# Patient Record
Sex: Female | Born: 1950 | Race: White | Hispanic: No | Marital: Married | Smoking: Current every day smoker
Health system: Southern US, Community
[De-identification: ages and names within clinical notes are randomized; demographics above are authoritative.]

## PROBLEM LIST (undated history)

## (undated) DIAGNOSIS — G9340 Encephalopathy, unspecified: Secondary | ICD-10-CM

## (undated) DIAGNOSIS — I10 Essential (primary) hypertension: Secondary | ICD-10-CM

## (undated) DIAGNOSIS — J45909 Unspecified asthma, uncomplicated: Secondary | ICD-10-CM

## (undated) DIAGNOSIS — I499 Cardiac arrhythmia, unspecified: Secondary | ICD-10-CM

## (undated) DIAGNOSIS — G9341 Metabolic encephalopathy: Secondary | ICD-10-CM

## (undated) DIAGNOSIS — R0902 Hypoxemia: Secondary | ICD-10-CM

## (undated) DIAGNOSIS — K219 Gastro-esophageal reflux disease without esophagitis: Secondary | ICD-10-CM

## (undated) DIAGNOSIS — M419 Scoliosis, unspecified: Secondary | ICD-10-CM

## (undated) DIAGNOSIS — S2239XA Fracture of one rib, unspecified side, initial encounter for closed fracture: Secondary | ICD-10-CM

## (undated) DIAGNOSIS — G8929 Other chronic pain: Secondary | ICD-10-CM

## (undated) DIAGNOSIS — G8918 Other acute postprocedural pain: Secondary | ICD-10-CM

## (undated) DIAGNOSIS — E785 Hyperlipidemia, unspecified: Secondary | ICD-10-CM

## (undated) DIAGNOSIS — T7840XA Allergy, unspecified, initial encounter: Secondary | ICD-10-CM

## (undated) DIAGNOSIS — I82409 Acute embolism and thrombosis of unspecified deep veins of unspecified lower extremity: Secondary | ICD-10-CM

## (undated) DIAGNOSIS — Z972 Presence of dental prosthetic device (complete) (partial): Secondary | ICD-10-CM

## (undated) DIAGNOSIS — G629 Polyneuropathy, unspecified: Secondary | ICD-10-CM

## (undated) DIAGNOSIS — J189 Pneumonia, unspecified organism: Secondary | ICD-10-CM

## (undated) DIAGNOSIS — D649 Anemia, unspecified: Secondary | ICD-10-CM

## (undated) DIAGNOSIS — G43909 Migraine, unspecified, not intractable, without status migrainosus: Secondary | ICD-10-CM

## (undated) DIAGNOSIS — J690 Pneumonitis due to inhalation of food and vomit: Secondary | ICD-10-CM

## (undated) DIAGNOSIS — I214 Non-ST elevation (NSTEMI) myocardial infarction: Secondary | ICD-10-CM

## (undated) HISTORY — DX: Pneumonitis due to inhalation of food and vomit: J69.0

## (undated) HISTORY — PX: EYE SURGERY: SHX253

## (undated) HISTORY — DX: Non-ST elevation (NSTEMI) myocardial infarction: I21.4

## (undated) HISTORY — PX: BACK SURGERY: SHX140

## (undated) HISTORY — DX: Migraine, unspecified, not intractable, without status migrainosus: G43.909

## (undated) HISTORY — DX: Scoliosis, unspecified: M41.9

## (undated) HISTORY — DX: Pneumonia, unspecified organism: J18.9

## (undated) HISTORY — DX: Allergy, unspecified, initial encounter: T78.40XA

## (undated) HISTORY — DX: Metabolic encephalopathy: G93.41

## (undated) HISTORY — DX: Acute embolism and thrombosis of unspecified deep veins of unspecified lower extremity: I82.409

## (undated) HISTORY — PX: SPINE SURGERY: SHX786

## (undated) HISTORY — PX: APPENDECTOMY: SHX54

## (undated) HISTORY — PX: CARDIAC CATHETERIZATION: SHX172

## (undated) HISTORY — DX: Gastro-esophageal reflux disease without esophagitis: K21.9

## (undated) HISTORY — DX: Hyperlipidemia, unspecified: E78.5

## (undated) HISTORY — PX: VASCULAR SURGERY: SHX849

## (undated) HISTORY — DX: Hypoxemia: R09.02

## (undated) HISTORY — DX: Fracture of one rib, unspecified side, initial encounter for closed fracture: S22.39XA

## (undated) HISTORY — DX: Encephalopathy, unspecified: G93.40

## (undated) HISTORY — DX: Essential (primary) hypertension: I10

## (undated) HISTORY — PX: CERVICAL FUSION: SHX112

## (undated) HISTORY — PX: TONSILLECTOMY: SUR1361

## (undated) HISTORY — DX: Other acute postprocedural pain: G89.18

## (undated) HISTORY — DX: Polyneuropathy, unspecified: G62.9

## (undated) HISTORY — PX: FOOT SURGERY: SHX648

## (undated) HISTORY — DX: Other chronic pain: G89.29

---

## 1970-11-25 HISTORY — PX: CHOLECYSTECTOMY: SHX55

## 1973-11-25 HISTORY — PX: ABDOMINAL HYSTERECTOMY: SHX81

## 2001-10-20 ENCOUNTER — Other Ambulatory Visit: Admission: RE | Admit: 2001-10-20 | Discharge: 2001-10-20 | Payer: Self-pay | Admitting: Family Medicine

## 2001-10-20 LAB — HM PAP SMEAR: HM Pap smear: NORMAL

## 2005-10-16 ENCOUNTER — Other Ambulatory Visit: Payer: Self-pay

## 2005-10-16 ENCOUNTER — Emergency Department: Payer: Self-pay | Admitting: Emergency Medicine

## 2007-05-26 ENCOUNTER — Ambulatory Visit: Payer: Self-pay

## 2008-05-20 ENCOUNTER — Ambulatory Visit (HOSPITAL_COMMUNITY): Admission: RE | Admit: 2008-05-20 | Discharge: 2008-05-20 | Payer: Self-pay | Admitting: Cardiovascular Disease

## 2008-05-24 ENCOUNTER — Encounter: Admission: RE | Admit: 2008-05-24 | Discharge: 2008-05-24 | Payer: Self-pay | Admitting: Cardiovascular Disease

## 2008-05-30 ENCOUNTER — Inpatient Hospital Stay (HOSPITAL_COMMUNITY): Admission: RE | Admit: 2008-05-30 | Discharge: 2008-05-31 | Payer: Self-pay | Admitting: Cardiovascular Disease

## 2008-05-30 HISTORY — PX: OTHER SURGICAL HISTORY: SHX169

## 2008-11-25 DIAGNOSIS — G629 Polyneuropathy, unspecified: Secondary | ICD-10-CM

## 2008-11-25 HISTORY — DX: Polyneuropathy, unspecified: G62.9

## 2009-08-01 ENCOUNTER — Ambulatory Visit: Payer: Self-pay | Admitting: Family Medicine

## 2009-08-01 LAB — HM MAMMOGRAPHY

## 2009-09-06 ENCOUNTER — Ambulatory Visit: Payer: Self-pay

## 2009-09-26 ENCOUNTER — Ambulatory Visit (HOSPITAL_COMMUNITY): Admission: RE | Admit: 2009-09-26 | Discharge: 2009-09-27 | Payer: Self-pay | Admitting: Cardiovascular Disease

## 2009-10-30 ENCOUNTER — Observation Stay (HOSPITAL_COMMUNITY): Admission: EM | Admit: 2009-10-30 | Discharge: 2009-10-31 | Payer: Self-pay | Admitting: Cardiovascular Disease

## 2009-10-30 ENCOUNTER — Emergency Department: Payer: Self-pay | Admitting: Emergency Medicine

## 2009-10-31 HISTORY — PX: OTHER SURGICAL HISTORY: SHX169

## 2009-11-28 ENCOUNTER — Ambulatory Visit: Payer: Self-pay | Admitting: Family Medicine

## 2010-01-10 ENCOUNTER — Encounter: Admission: RE | Admit: 2010-01-10 | Discharge: 2010-01-10 | Payer: Self-pay | Admitting: Cardiovascular Disease

## 2010-01-16 ENCOUNTER — Ambulatory Visit: Payer: Self-pay | Admitting: Family Medicine

## 2010-04-25 ENCOUNTER — Ambulatory Visit: Payer: Self-pay | Admitting: Gastroenterology

## 2010-04-25 LAB — HM COLONOSCOPY

## 2010-06-07 ENCOUNTER — Emergency Department: Payer: Self-pay | Admitting: Emergency Medicine

## 2011-02-26 LAB — HEPARIN LEVEL (UNFRACTIONATED): Heparin Unfractionated: 0.37 IU/mL (ref 0.30–0.70)

## 2011-02-26 LAB — CARDIAC PANEL(CRET KIN+CKTOT+MB+TROPI)
Relative Index: INVALID (ref 0.0–2.5)
Relative Index: INVALID (ref 0.0–2.5)
Total CK: 77 U/L (ref 7–177)
Troponin I: <0.01 ng/mL (ref 0.00–0.06)
Troponin I: <0.01 ng/mL (ref 0.00–0.06)

## 2011-02-26 LAB — BASIC METABOLIC PANEL
BUN: 15 mg/dL (ref 6–23)
CO2: 26 mEq/L (ref 19–32)
Calcium: 8.8 mg/dL (ref 8.4–10.5)
GFR calc non Af Amer: >60 mL/min (ref >60)
Glucose, Bld: 98 mg/dL (ref 70–99)
Sodium: 141 mEq/L (ref 135–145)

## 2011-02-26 LAB — CBC
HCT: 33.4 % — ABNORMAL LOW (ref 36.0–46.0)
Hemoglobin: 11.6 g/dL — ABNORMAL LOW (ref 12.0–15.0)
Platelets: 173 10*3/uL (ref 150–400)
RBC: 3.4 MIL/uL — ABNORMAL LOW (ref 3.87–5.11)
WBC: 5 10*3/uL (ref 4.0–10.5)

## 2011-02-26 LAB — LIPID PANEL
Total CHOL/HDL Ratio: 2.3 RATIO
VLDL: 21 mg/dL (ref 0–40)

## 2011-02-26 LAB — GLUCOSE, CAPILLARY: Glucose-Capillary: 95 mg/dL (ref 70–99)

## 2011-02-26 LAB — HEMOGLOBIN A1C
Hgb A1c MFr Bld: 5.9 % (ref 4.6–6.1)
Mean Plasma Glucose: 123 mg/dL

## 2011-02-26 LAB — MAGNESIUM: Magnesium: 2 mg/dL (ref 1.5–2.5)

## 2011-02-26 LAB — D-DIMER, QUANTITATIVE: D-Dimer, Quant: 0.32 ug/mL-FEU (ref 0.00–0.48)

## 2011-02-27 LAB — CBC
HCT: 36.5 % (ref 36.0–46.0)
MCV: 97.3 fL (ref 78.0–100.0)
MCV: 98.3 fL (ref 78.0–100.0)
Platelets: 187 10*3/uL (ref 150–400)
Platelets: ADEQUATE 10*3/uL (ref 150–400)
RBC: 3.71 MIL/uL — ABNORMAL LOW (ref 3.87–5.11)
RBC: 3.71 MIL/uL — ABNORMAL LOW (ref 3.87–5.11)
RDW: 13.2 % (ref 11.5–15.5)
WBC: 4 10*3/uL (ref 4.0–10.5)

## 2011-02-27 LAB — BASIC METABOLIC PANEL
BUN: 18 mg/dL (ref 6–23)
Calcium: 9.1 mg/dL (ref 8.4–10.5)
Chloride: 104 mEq/L (ref 96–112)
Chloride: 104 mEq/L (ref 96–112)
GFR calc non Af Amer: >60 mL/min (ref >60)
GFR calc non Af Amer: >60 mL/min (ref >60)
Glucose, Bld: 91 mg/dL (ref 70–99)
Potassium: 3.7 mEq/L (ref 3.5–5.1)
Potassium: 3.9 mEq/L (ref 3.5–5.1)
Sodium: 138 mEq/L (ref 135–145)
Sodium: 140 mEq/L (ref 135–145)

## 2011-02-27 LAB — PROTIME-INR: INR: 0.9 (ref 0.00–1.49)

## 2011-02-27 LAB — APTT: aPTT: 31 seconds (ref 24–37)

## 2011-04-09 NOTE — Cardiovascular Report (Signed)
[name], Cheryl [name] NO.:  [id number]   MEDICAL RECORD NO.:  [id number]          PATIENT TYPE:  INP   LOCATION:  6529                         FACILITY:  [facility]   PHYSICIAN:  Nanetta Batty, M.D.   DATE OF BIRTH:  09-29-1951   DATE OF PROCEDURE:  DATE OF DISCHARGE:                            CARDIAC CATHETERIZATION   Mr. Cheryl [name] is a very pleasant 60 year old married white female mother of  3, grandmother of 9 grandchildren who is referred to me by Dr. Sherlynn Stalls for cyanotic left fourth and fifth toes and foot pain.  She had  Dopplers, which were normal.  CT angiogram, which showed distal  abdominal aortic stenosis.  She had negative Myoview normal 2-D echo.  She presents now for angiography and potential intervention for symptom-  limiting claudication.   PROCEDURE DESCRIPTION:  The patient was brought to the Second Floor  [facility] in the postabsorptive state.  She was  premedicated with p.o. Valium.  Her right and left groins were prepped  and shaved in the usual sterile fashion.  Xylocaine 1% was used for  local anesthesia.  A 5-French sheath was inserted into the right femoral  artery using standard Seldinger technique.  A Wholey wire was then  advanced across the distal abdominal aortic stenosis with the aid of a  short 5-French right Judkins catheter for directionality.  A 5-French  pigtail catheter was used for abdominal aortography.  Visipaque dye was  used  for the entirety of the case.  Retrograde aortic pressures were  monitored during the case.   ANGIOGRAPHIC RESULTS:  1. Abdominal aorta stent;      a.     90% fairly focal napkin ring distal abdominal aortic       stenosis.      b.     Abnormal aortoiliac bifurcation.   PROCEDURE DESCRIPTION:  The patient received 3000 units of heparin  intravenously.  The 5-French sheath in the right femoral artery was  exchanged over wire for a 8-French Brite Tip 30-cm long Cordis  sheath,  which was then advanced across the stenosis over the [facility] wire.  Using  a 9 x 28 OmniLink balloon expandable stent, this was then positioned  across the stenosis angiographically and deployed at 8-10 atmospheres.  Because of some directional bias towards the right side with incomplete  apposition of the left lateral border of distal abdominal aorta, access  was obtained in the left femoral artery using a short 8-French sheath  and the stent was then dilated with a 10 x 2 balloon, both from the  right and left side sequentially.  Resulting reduction of a 90% distal  abdominal aortic stenosis to 0% residual.  There was a small linear  dissection contained within the stent segment without extravasation.  The patient tolerated the procedure well.  The guidewires were removed.  The patient left lab in stable condition.  ACT will be measured and the  sheaths will be removed once documentation of ACT blood 200.  Pressure will be held in the groins to achieve  hemostasis.  The patient  was allergic to aspirin, will be loaded with Plavix and treated with  p.o. 75 mg of p.o. Plavix.  She left lab in stable condition.  Dr.  Suzette Battiest was notified of these results.  The patient left lab in stable  condition.      Nanetta Batty, M.D.  Electronically Signed     JB/MEDQ  D:  05/30/2008  T:  05/31/2008  Job:  [id number]   cc:   Second Floor [facility]  [facility]  Sherlynn Stalls

## 2011-04-09 NOTE — Procedures (Signed)
NAMEARCHITA, Cheryl [name]                  ACCOUNT NO.:  [id number]   MEDICAL RECORD NO.:  [id number]          PATIENT TYPE:  INP   LOCATION:  2807                         FACILITY:  [facility]   PHYSICIAN:  Nanetta Batty, M.D.   DATE OF BIRTH:  07-01-1951   DATE OF PROCEDURE:  09/26/2009  DATE OF DISCHARGE:                    PERIPHERAL VASCULAR INVASIVE PROCEDURE   PROCEDURE:  Peripheral angiogram/ percutaneous transluminal angioplasty  and stent procedure.   REASON FOR PRESENTATION:  Ms. Weissberg is a 60 year old female with  peripheral vascular occlusive disease status post distal abdominal  aortic PTA stenting by myself, May 30, 2008, with an Omnilink Balloon  expandable 9 x 28 mm stent.  Post procedure her ABI's normalized and her  symptoms resolved.  She does have a history of hypertension nondistended  hypovolemia as well as tobacco abuse.  She has had recurrent  claudication with ABI's that have gone down since her last study.  She  presents now for angiography and potential re-intervention.   DESCRIPTION OF PROCEDURE:  The patient was brought to the second floor  [facility] suite in the post-absorptive state.  She was  premedicated with p.o. Valium.  Her right groin was prepped and draped  in the usual sterile fashion.  A 1% Xylocaine was used for local  anesthesia.  A 5-French sheath was inserted into the right femoral  artery using standard Seldinger technique.  A 5-French tennis racquet  catheter was used for abdominal aortography in the PA and lateral view.  Visipaque dye was used throughout the entirety of the case.  Retrograde  aortic pressures were monitored during the case.   ANGIOGRAPHIC RESULTS:  The distal abdominal aortic stent had a mobile  intraluminal filling defect obstructing 50 to 70% of the lumen.  The  iliac bifurcation was free of significant disease.   The existing 5-French sheath was exchanged over a __________wire for a 7-  [place] __________tip  sheath.  The patient received 2500 units of heparin  intravenously.  The distal aorta was restented using a 12 x 4 Smart  Nitinol self-expanding stent, trapping the intraluminal defect within  the previously stented segment.  It was post dilated with a 10 x 2  Powerflex at 4 atmospheres resulting in reduction of 50 to 70%  obstruction secondary to a mobile intraluminal filling defect to 0%  residual with excellent flow.  There was mild extension of the distal  end of the stent within the origin of the left common iliac artery, but  this does not appear to be flow-limiting.   IMPRESSION:  Successful percutaneous transluminal angioplasty and re-  stenting of the distal abdominal aorta secondary to a filling defect,  probably related to tissue prolapse.  The patient tolerated the  procedure well.  The ACT was measured and the sheath was removed.  Pressure was held in the groin to achieve hemostasis.  The patient left  the Lab in stable condition.  She will be gently hydrated overnight and  discharged home in the morning on Effient.  She will obtain follow up  abdominal and lower extremity Doppler studies and we  will see her back  in the office after that in followup.  She left the Lab in stable  condition.      Nanetta Batty, M.D.  Electronically Signed     JB/MEDQ  D:  09/26/2009  T:  09/26/2009  Job:  [id number]   cc:   [facility]  [facility], Dr. Sherlynn Stalls [facility]

## 2011-04-12 NOTE — Discharge Summary (Signed)
NAMEQUETZALLY, CALLAS                  ACCOUNT NO.:  [id number]   MEDICAL RECORD NO.:  [id number]          PATIENT TYPE:  INP   LOCATION:  6529                         FACILITY:  [facility]   PHYSICIAN:  Nanetta Batty, M.D.   DATE OF BIRTH:  [birth year]   DATE OF ADMISSION:  05/30/2008  DATE OF DISCHARGE:  05/31/2008                               DISCHARGE SUMMARY   DISCHARGE DIAGNOSES:  1. Abnormal ankle-brachial indices with cyanotic toes.  2. CT angio revealed high-grade lesion in the distal aorta.  3. Hip and buttock claudication.  4. 90% fairly focal napkin-ring distal abdominal aortic stenosis with      percutaneous transluminal angioplasty and stent deployment.  5. Chest pain, negative cardiac markers and history of negative stress      test recently.   DISCHARGE CONDITION:  Improved.   PROCEDURES:  Pulmonary vein angioplasty by Dr. Nanetta Batty with  percutaneous transluminal angioplasty and stent deployment, 90% stenosis  reduced to 0.   ALLERGIES:  ASPIRIN, CODEINE SULFATE, and CONTRAST.  Please note, the  patient has had pruritus on CT angio, so she was premedicated for  cardiac catheterization and her PV angiogram.   DISCHARGE MEDICATIONS:  1. New medication Plavix 75 mg one daily.  2. Premarin 0.625 daily.  3. Prilosec 20 mg daily.  4. Amitriptyline 10 mg as needed.  5. Cyclobenzaprine 10 mg as needed.  6. Benadryl 25 mg as needed.   DISCHARGE INSTRUCTIONS:  1. Low-sodium heart-healthy diet.  2. Wash cath site with soap and water.  Call if any bleeding,      swelling, or drainage.  3. Increase activity slowly.  May shower or bathe.  No lifting for 1      week.  No driving for 2 days.  4. Follow up with Dr. Allyson Sabal on June 21, 2008, at 4 p.m.  5. We will schedule for lower extremity arterial Dopplers on June 16, 2008, at 1:00 p.m. at Dr. Hazle Coca office and also an abdominal      Doppler.   HISTORY OF PRESENT ILLNESS:  A 60 year old female was seen by Dr.  Allyson Sabal  for claudication symptoms and cyanotic toes and abnormal Dopplers that  were done in our office.  She also underwent CT angio revealing high-  grade lesion in the distal aorta just above the iliac bifurcation.  This  was planned and then she was set up for PTA and stenting.   The patient presented, had the PV angio, the stent was placed, and she  did well that night.  After the stent, she did develop some chest  discomfort with nausea, was relieved with nitroglycerin, cardiac enzymes  were negative, and she had had no further pain.  It was felt that was  most likely related to GI as the nitroglycerin relieves reflux at times  as well.   By the morning of May 31, 2008, she was stable and would follow up as an  outpatient and was discharged home by Dr. Allyson Sabal.   PHYSICAL EXAM:  Blood pressure was 117/81 and pulse  87.  Groin cath site  was stable and 2+ pedal pulses.   LABORATORY DATA:  Hematocrit 33.5, WBC 9.8, and platelets 203.  Sodium  139, potassium 4.2, chloride 104, CO2 27, glucose 130, BUN 13,  creatinine 0.81, CK 28-27, MB 1.0, and troponin-I 0.01 x2.   EKG in the morning after the chest pain, sinus rhythm, nonspecific ST  changes.  No changes since her last tracing.   The patient was stable and was discharged home.      Darcella Gasman. Annie Paras, N.P.      Nanetta Batty, M.D.  Electronically Signed    LRI/MEDQ  D:  07/05/2008  T:  07/06/2008  Job:  [id number]   cc:   Sherlynn Stalls

## 2011-08-22 LAB — CBC
HCT: 33.5 — ABNORMAL LOW
Hemoglobin: 11.4 — ABNORMAL LOW
MCHC: 34.1
WBC: 9.8

## 2011-08-22 LAB — BASIC METABOLIC PANEL
BUN: 13
Creatinine, Ser: 0.81
GFR calc non Af Amer: >60
Glucose, Bld: 130 — ABNORMAL HIGH

## 2011-08-22 LAB — CARDIAC PANEL(CRET KIN+CKTOT+MB+TROPI): Troponin I: 0.01

## 2011-11-11 ENCOUNTER — Inpatient Hospital Stay: Payer: Self-pay | Admitting: Internal Medicine

## 2012-02-03 ENCOUNTER — Inpatient Hospital Stay: Payer: Self-pay | Admitting: Internal Medicine

## 2012-02-03 LAB — TSH: Thyroid Stimulating Horm: 1.42 u[IU]/mL

## 2012-02-03 LAB — URINALYSIS, COMPLETE
Bacteria: NONE SEEN
Glucose,UR: NEGATIVE mg/dL (ref 0–75)
Leukocyte Esterase: NEGATIVE
Nitrite: NEGATIVE
Specific Gravity: 1.026 (ref 1.003–1.030)
WBC UR: 1 /HPF (ref 0–5)

## 2012-02-03 LAB — BASIC METABOLIC PANEL
Anion Gap: 12 (ref 7–16)
Calcium, Total: 9.3 mg/dL (ref 8.5–10.1)
Chloride: 102 mmol/L (ref 98–107)
Co2: 23 mmol/L (ref 21–32)
EGFR (Non-African Amer.): >60
Glucose: 97 mg/dL (ref 65–99)
Osmolality: 278 (ref 275–301)
Sodium: 137 mmol/L (ref 136–145)

## 2012-02-03 LAB — DIFFERENTIAL
Basophil %: 0.5 %
Eosinophil %: 0 %
Monocyte %: 6 %
Neutrophil %: 75.7 %

## 2012-02-03 LAB — CBC
HCT: 37.2 % (ref 35.0–47.0)
MCH: 33.8 pg (ref 26.0–34.0)
MCHC: 34 g/dL (ref 32.0–36.0)
RBC: 3.75 10*6/uL — ABNORMAL LOW (ref 3.80–5.20)
RDW: 13.8 % (ref 11.5–14.5)
WBC: 5.7 10*3/uL (ref 3.6–11.0)

## 2012-02-03 LAB — TROPONIN I
Troponin-I: <0.02 ng/mL
Troponin-I: <0.02 ng/mL

## 2012-02-03 LAB — CK TOTAL AND CKMB (NOT AT ARMC)
CK, Total: 73 U/L (ref 21–215)
CK, Total: 80 U/L (ref 21–215)
CK-MB: 2.1 ng/mL (ref 0.5–3.6)

## 2012-02-04 LAB — CBC WITH DIFFERENTIAL/PLATELET
Eosinophil %: 0 %
HCT: 36 % (ref 35.0–47.0)
Lymphocyte #: 0.4 10*3/uL — ABNORMAL LOW (ref 1.0–3.6)
Lymphocyte %: 7 %
MCV: 100 fL (ref 80–100)
Monocyte %: 0.8 %
Neutrophil #: 4.8 10*3/uL (ref 1.4–6.5)
RDW: 13.9 % (ref 11.5–14.5)
WBC: 5.2 10*3/uL (ref 3.6–11.0)

## 2012-02-04 LAB — BASIC METABOLIC PANEL
Chloride: 101 mmol/L (ref 98–107)
Co2: 22 mmol/L (ref 21–32)
Creatinine: 0.61 mg/dL (ref 0.60–1.30)
Osmolality: 282 (ref 275–301)
Sodium: 139 mmol/L (ref 136–145)

## 2012-02-09 LAB — CULTURE, BLOOD (SINGLE)

## 2012-03-29 ENCOUNTER — Emergency Department: Payer: Self-pay | Admitting: Unknown Physician Specialty

## 2012-03-29 LAB — COMPREHENSIVE METABOLIC PANEL
Albumin: 3.7 g/dL (ref 3.4–5.0)
Anion Gap: 12 (ref 7–16)
BUN: 29 mg/dL — ABNORMAL HIGH (ref 7–18)
Chloride: 103 mmol/L (ref 98–107)
EGFR (Non-African Amer.): >60
Glucose: 75 mg/dL (ref 65–99)
SGPT (ALT): 15 U/L
Total Protein: 7.3 g/dL (ref 6.4–8.2)

## 2012-03-29 LAB — CBC WITH DIFFERENTIAL/PLATELET
Basophil %: 0.8 %
HCT: 36.8 % (ref 35.0–47.0)
HGB: 12.5 g/dL (ref 12.0–16.0)
Lymphocyte #: 0.8 10*3/uL — ABNORMAL LOW (ref 1.0–3.6)
Lymphocyte %: 18.3 %
MCV: 98 fL (ref 80–100)
Monocyte %: 6.2 %
RBC: 3.76 10*6/uL — ABNORMAL LOW (ref 3.80–5.20)

## 2012-03-29 LAB — TROPONIN I: Troponin-I: <0.02 ng/mL

## 2012-04-04 LAB — CULTURE, BLOOD (SINGLE)

## 2012-08-05 ENCOUNTER — Ambulatory Visit: Payer: Self-pay | Admitting: Orthopedic Surgery

## 2012-08-13 ENCOUNTER — Ambulatory Visit: Payer: Self-pay | Admitting: Family Medicine

## 2012-08-27 ENCOUNTER — Ambulatory Visit: Payer: Self-pay | Admitting: Pain Medicine

## 2012-09-10 ENCOUNTER — Ambulatory Visit: Payer: Self-pay | Admitting: Pain Medicine

## 2012-09-28 ENCOUNTER — Ambulatory Visit: Payer: Self-pay | Admitting: Pain Medicine

## 2012-10-06 ENCOUNTER — Ambulatory Visit: Payer: Self-pay | Admitting: Pain Medicine

## 2012-10-26 ENCOUNTER — Ambulatory Visit: Payer: Self-pay | Admitting: Pain Medicine

## 2012-11-05 ENCOUNTER — Ambulatory Visit: Payer: Self-pay | Admitting: Pain Medicine

## 2012-11-26 ENCOUNTER — Ambulatory Visit: Payer: Self-pay | Admitting: Family Medicine

## 2012-11-26 LAB — CREATININE, SERUM
Creatinine: 0.85 mg/dL (ref 0.60–1.30)
EGFR (African American): >60
EGFR (Non-African Amer.): >60

## 2012-12-09 ENCOUNTER — Ambulatory Visit: Payer: Self-pay | Admitting: Pain Medicine

## 2012-12-22 ENCOUNTER — Ambulatory Visit: Payer: Self-pay | Admitting: Neurology

## 2012-12-29 ENCOUNTER — Ambulatory Visit: Payer: Self-pay | Admitting: Pain Medicine

## 2013-01-21 ENCOUNTER — Ambulatory Visit: Payer: Self-pay | Admitting: Pain Medicine

## 2013-01-26 ENCOUNTER — Observation Stay: Payer: Self-pay | Admitting: Family Medicine

## 2013-01-26 LAB — URINALYSIS, COMPLETE
Bilirubin,UR: NEGATIVE
Blood: NEGATIVE
Leukocyte Esterase: NEGATIVE
Nitrite: NEGATIVE
Ph: 5 (ref 4.5–8.0)
RBC,UR: 1 /HPF (ref 0–5)
Squamous Epithelial: 1

## 2013-01-26 LAB — CBC
MCHC: 33.8 g/dL (ref 32.0–36.0)
MCV: 105 fL — ABNORMAL HIGH (ref 80–100)
Platelet: 249 10*3/uL (ref 150–440)
RBC: 4.03 10*6/uL (ref 3.80–5.20)
RDW: 13.4 % (ref 11.5–14.5)

## 2013-01-26 LAB — COMPREHENSIVE METABOLIC PANEL
BUN: 16 mg/dL (ref 7–18)
Calcium, Total: 8.6 mg/dL (ref 8.5–10.1)
Chloride: 103 mmol/L (ref 98–107)
Co2: 28 mmol/L (ref 21–32)
SGOT(AST): 25 U/L (ref 15–37)
SGPT (ALT): 21 U/L (ref 12–78)

## 2013-01-27 LAB — BASIC METABOLIC PANEL
Anion Gap: 5 — ABNORMAL LOW (ref 7–16)
Chloride: 109 mmol/L — ABNORMAL HIGH (ref 98–107)
Co2: 26 mmol/L (ref 21–32)
Creatinine: 0.88 mg/dL (ref 0.60–1.30)
Glucose: 82 mg/dL (ref 65–99)
Osmolality: 279 (ref 275–301)
Potassium: 3.4 mmol/L — ABNORMAL LOW (ref 3.5–5.1)

## 2013-01-27 LAB — CBC WITH DIFFERENTIAL/PLATELET
Basophil #: 0 10*3/uL (ref 0.0–0.1)
Eosinophil #: 0.1 10*3/uL (ref 0.0–0.7)
HCT: 30.8 % — ABNORMAL LOW (ref 35.0–47.0)
HGB: 10.8 g/dL — ABNORMAL LOW (ref 12.0–16.0)
Lymphocyte #: 0.5 10*3/uL — ABNORMAL LOW (ref 1.0–3.6)
Lymphocyte %: 9.5 %
MCH: 36.3 pg — ABNORMAL HIGH (ref 26.0–34.0)
Monocyte %: 8.9 %
Neutrophil #: 4.1 10*3/uL (ref 1.4–6.5)
Platelet: 220 10*3/uL (ref 150–440)
RDW: 13.1 % (ref 11.5–14.5)

## 2013-01-28 LAB — CBC WITH DIFFERENTIAL/PLATELET
Eosinophil %: 0.5 %
HGB: 10.7 g/dL — ABNORMAL LOW (ref 12.0–16.0)
Lymphocyte #: 0.5 10*3/uL — ABNORMAL LOW (ref 1.0–3.6)
Lymphocyte %: 13.9 %
MCH: 35 pg — ABNORMAL HIGH (ref 26.0–34.0)
MCHC: 33.9 g/dL (ref 32.0–36.0)
MCV: 103 fL — ABNORMAL HIGH (ref 80–100)
Neutrophil #: 2.6 10*3/uL (ref 1.4–6.5)
Neutrophil %: 73 %
RBC: 3.06 10*6/uL — ABNORMAL LOW (ref 3.80–5.20)
WBC: 3.5 10*3/uL — ABNORMAL LOW (ref 3.6–11.0)

## 2013-01-28 LAB — BASIC METABOLIC PANEL
BUN: 12 mg/dL (ref 7–18)
Calcium, Total: 7.7 mg/dL — ABNORMAL LOW (ref 8.5–10.1)
Co2: 18 mmol/L — ABNORMAL LOW (ref 21–32)
Creatinine: 0.67 mg/dL (ref 0.60–1.30)
EGFR (African American): >60
Osmolality: 273 (ref 275–301)
Potassium: 3.6 mmol/L (ref 3.5–5.1)

## 2013-01-29 LAB — CBC WITH DIFFERENTIAL/PLATELET
HGB: 10.3 g/dL — ABNORMAL LOW (ref 12.0–16.0)
Neutrophil #: 2.6 10*3/uL (ref 1.4–6.5)
Platelet: 216 10*3/uL (ref 150–440)
RBC: 2.94 10*6/uL — ABNORMAL LOW (ref 3.80–5.20)
RDW: 12.6 % (ref 11.5–14.5)

## 2013-02-01 LAB — CULTURE, BLOOD (SINGLE)

## 2013-03-10 ENCOUNTER — Ambulatory Visit: Payer: Self-pay | Admitting: Pain Medicine

## 2013-04-02 ENCOUNTER — Observation Stay: Payer: Self-pay | Admitting: Internal Medicine

## 2013-04-02 LAB — COMPREHENSIVE METABOLIC PANEL
Albumin: 3.6 g/dL (ref 3.4–5.0)
Alkaline Phosphatase: 82 U/L (ref 50–136)
BUN: 14 mg/dL (ref 7–18)
Calcium, Total: 9 mg/dL (ref 8.5–10.1)
Co2: 26 mmol/L (ref 21–32)
Creatinine: 0.9 mg/dL (ref 0.60–1.30)
Glucose: 91 mg/dL (ref 65–99)
Potassium: 3.6 mmol/L (ref 3.5–5.1)
SGOT(AST): 17 U/L (ref 15–37)
SGPT (ALT): 17 U/L (ref 12–78)
Sodium: 140 mmol/L (ref 136–145)
Total Protein: 6.8 g/dL (ref 6.4–8.2)

## 2013-04-02 LAB — DRUG SCREEN, URINE
Barbiturates, Ur Screen: NEGATIVE (ref ?–200)
Benzodiazepine, Ur Scrn: NEGATIVE (ref ?–200)
Cannabinoid 50 Ng, Ur ~~LOC~~: POSITIVE (ref ?–50)
Cocaine Metabolite,Ur ~~LOC~~: NEGATIVE (ref ?–300)
Opiate, Ur Screen: NEGATIVE (ref ?–300)
Phencyclidine (PCP) Ur S: NEGATIVE (ref ?–25)

## 2013-04-02 LAB — URINALYSIS, COMPLETE
Blood: NEGATIVE
Leukocyte Esterase: NEGATIVE
Protein: NEGATIVE
RBC,UR: 3 /HPF (ref 0–5)
Specific Gravity: 1.018 (ref 1.003–1.030)
Squamous Epithelial: 1

## 2013-04-02 LAB — CK TOTAL AND CKMB (NOT AT ARMC)
CK, Total: 82 U/L (ref 21–215)
CK-MB: 0.7 ng/mL (ref 0.5–3.6)

## 2013-04-02 LAB — TROPONIN I: Troponin-I: <0.02 ng/mL

## 2013-04-02 LAB — CSF CELL COUNT WITH DIFFERENTIAL
CSF Tube #: 3
Other Cells: 0 %
RBC (CSF): 10 /mm3

## 2013-04-02 LAB — CBC
HCT: 33.9 % — ABNORMAL LOW (ref 35.0–47.0)
MCH: 33.5 pg (ref 26.0–34.0)
MCV: 99 fL (ref 80–100)
RBC: 3.44 10*6/uL — ABNORMAL LOW (ref 3.80–5.20)
WBC: 4.7 10*3/uL (ref 3.6–11.0)

## 2013-04-02 LAB — PROTEIN, CSF: Protein, CSF: 24 mg/dL (ref 15–45)

## 2013-04-02 LAB — GLUCOSE, CSF: Glucose, CSF: 52 mg/dL (ref 40–75)

## 2013-04-03 LAB — BASIC METABOLIC PANEL
Calcium, Total: 9 mg/dL (ref 8.5–10.1)
EGFR (Non-African Amer.): >60
Glucose: 80 mg/dL (ref 65–99)
Potassium: 3.8 mmol/L (ref 3.5–5.1)
Sodium: 138 mmol/L (ref 136–145)

## 2013-04-03 LAB — CBC WITH DIFFERENTIAL/PLATELET
Basophil #: 0.1 10*3/uL (ref 0.0–0.1)
Eosinophil #: 0 10*3/uL (ref 0.0–0.7)
HGB: 11.2 g/dL — ABNORMAL LOW (ref 12.0–16.0)
Lymphocyte %: 17.6 %
MCHC: 34.2 g/dL (ref 32.0–36.0)
MCV: 98 fL (ref 80–100)
Monocyte #: 0.4 x10 3/mm (ref 0.2–0.9)
Monocyte %: 7 %
Neutrophil %: 74.2 %
RBC: 3.35 10*6/uL — ABNORMAL LOW (ref 3.80–5.20)
RDW: 12.6 % (ref 11.5–14.5)

## 2013-04-03 LAB — CK TOTAL AND CKMB (NOT AT ARMC): CK-MB: 1.4 ng/mL (ref 0.5–3.6)

## 2013-04-05 LAB — CSF CULTURE W GRAM STAIN

## 2013-04-06 ENCOUNTER — Ambulatory Visit: Payer: Self-pay | Admitting: Pain Medicine

## 2013-04-28 ENCOUNTER — Ambulatory Visit: Payer: Self-pay | Admitting: Pain Medicine

## 2013-05-07 ENCOUNTER — Other Ambulatory Visit: Payer: Self-pay | Admitting: Pain Medicine

## 2013-05-07 LAB — MAGNESIUM: Magnesium: 1.9 mg/dL

## 2013-05-19 ENCOUNTER — Ambulatory Visit: Payer: Self-pay | Admitting: Pain Medicine

## 2013-08-17 ENCOUNTER — Ambulatory Visit: Payer: Self-pay | Admitting: Family Medicine

## 2013-08-18 ENCOUNTER — Ambulatory Visit: Payer: Self-pay | Admitting: Family Medicine

## 2013-08-18 LAB — HM DEXA SCAN

## 2013-08-27 ENCOUNTER — Ambulatory Visit: Payer: Self-pay | Admitting: Pain Medicine

## 2013-09-21 ENCOUNTER — Ambulatory Visit: Payer: Self-pay | Admitting: Pain Medicine

## 2013-10-06 ENCOUNTER — Ambulatory Visit: Payer: Self-pay | Admitting: Pain Medicine

## 2013-12-03 ENCOUNTER — Ambulatory Visit: Payer: Self-pay | Admitting: Pain Medicine

## 2014-02-10 DIAGNOSIS — I739 Peripheral vascular disease, unspecified: Secondary | ICD-10-CM | POA: Insufficient documentation

## 2014-02-10 DIAGNOSIS — I2510 Atherosclerotic heart disease of native coronary artery without angina pectoris: Secondary | ICD-10-CM | POA: Insufficient documentation

## 2014-02-10 DIAGNOSIS — F419 Anxiety disorder, unspecified: Secondary | ICD-10-CM | POA: Insufficient documentation

## 2014-04-05 ENCOUNTER — Ambulatory Visit: Payer: Self-pay | Admitting: Pain Medicine

## 2014-04-13 DIAGNOSIS — G629 Polyneuropathy, unspecified: Secondary | ICD-10-CM | POA: Insufficient documentation

## 2014-04-15 ENCOUNTER — Ambulatory Visit: Payer: Self-pay | Admitting: Pain Medicine

## 2014-04-25 ENCOUNTER — Ambulatory Visit: Payer: Self-pay | Admitting: Family Medicine

## 2014-05-03 ENCOUNTER — Ambulatory Visit: Payer: Self-pay | Admitting: Family Medicine

## 2014-05-10 ENCOUNTER — Ambulatory Visit: Payer: Self-pay | Admitting: Pain Medicine

## 2014-05-30 ENCOUNTER — Ambulatory Visit: Payer: Self-pay | Admitting: Pain Medicine

## 2014-07-06 LAB — LIPID PANEL
CHOLESTEROL: 157 mg/dL (ref 0–200)
HDL: 83 mg/dL — AB (ref 35–70)
LDL CALC: 55 mg/dL
TRIGLYCERIDES: 93 mg/dL (ref 40–160)

## 2014-07-06 LAB — BASIC METABOLIC PANEL
Creatinine: 1 mg/dL (ref <=1.1)
Glucose: 83 mg/dL

## 2014-07-12 ENCOUNTER — Ambulatory Visit: Payer: Self-pay | Admitting: Pain Medicine

## 2014-08-19 ENCOUNTER — Emergency Department: Payer: Self-pay | Admitting: Emergency Medicine

## 2014-08-19 LAB — CBC WITH DIFFERENTIAL/PLATELET
BASOS ABS: 0 10*3/uL (ref 0.0–0.1)
Basophil %: 0.2 %
EOS PCT: 0.2 %
Eosinophil #: 0 10*3/uL (ref 0.0–0.7)
HCT: 36.6 % (ref 35.0–47.0)
HGB: 12.1 g/dL (ref 12.0–16.0)
Lymphocyte #: 0.6 10*3/uL — ABNORMAL LOW (ref 1.0–3.6)
Lymphocyte %: 10.9 %
MCH: 33.3 pg (ref 26.0–34.0)
MCHC: 33.1 g/dL (ref 32.0–36.0)
MCV: 100 fL (ref 80–100)
MONO ABS: 0.4 x10 3/mm (ref 0.2–0.9)
MONOS PCT: 7.8 %
Neutrophil #: 4.5 10*3/uL (ref 1.4–6.5)
Neutrophil %: 80.9 %
PLATELETS: 228 10*3/uL (ref 150–440)
RBC: 3.64 10*6/uL — ABNORMAL LOW (ref 3.80–5.20)
RDW: 13 % (ref 11.5–14.5)
WBC: 5.6 10*3/uL (ref 3.6–11.0)

## 2014-08-19 LAB — COMPREHENSIVE METABOLIC PANEL
ALK PHOS: 117 U/L — AB
ANION GAP: 8 (ref 7–16)
Albumin: 2.8 g/dL — ABNORMAL LOW (ref 3.4–5.0)
BUN: 17 mg/dL (ref 7–18)
Bilirubin,Total: 0.3 mg/dL (ref 0.2–1.0)
CREATININE: 0.74 mg/dL (ref 0.60–1.30)
Calcium, Total: 8.6 mg/dL (ref 8.5–10.1)
Chloride: 105 mmol/L (ref 98–107)
Co2: 25 mmol/L (ref 21–32)
GLUCOSE: 74 mg/dL (ref 65–99)
OSMOLALITY: 276 (ref 275–301)
Potassium: 3.3 mmol/L — ABNORMAL LOW (ref 3.5–5.1)
SGOT(AST): 19 U/L (ref 15–37)
SGPT (ALT): 18 U/L
SODIUM: 138 mmol/L (ref 136–145)
Total Protein: 6.8 g/dL (ref 6.4–8.2)

## 2014-08-19 LAB — URINALYSIS, COMPLETE
BILIRUBIN, UR: NEGATIVE
BLOOD: NEGATIVE
Glucose,UR: NEGATIVE mg/dL (ref 0–75)
Leukocyte Esterase: NEGATIVE
Nitrite: NEGATIVE
Ph: 5 (ref 4.5–8.0)
Protein: 30
Specific Gravity: 1.025 (ref 1.003–1.030)
WBC UR: 3 /HPF (ref 0–5)

## 2014-08-19 LAB — TROPONIN I

## 2014-08-20 ENCOUNTER — Inpatient Hospital Stay: Payer: Self-pay | Admitting: Specialist

## 2014-08-20 LAB — COMPREHENSIVE METABOLIC PANEL
ALBUMIN: 2.8 g/dL — AB (ref 3.4–5.0)
ALT: 20 U/L
Alkaline Phosphatase: 111 U/L
Anion Gap: 9 (ref 7–16)
BUN: 16 mg/dL (ref 7–18)
Bilirubin,Total: 0.4 mg/dL (ref 0.2–1.0)
CO2: 24 mmol/L (ref 21–32)
CREATININE: 0.8 mg/dL (ref 0.60–1.30)
Calcium, Total: 8 mg/dL — ABNORMAL LOW (ref 8.5–10.1)
Chloride: 102 mmol/L (ref 98–107)
EGFR (African American): >60
GLUCOSE: 68 mg/dL (ref 65–99)
Osmolality: 270 (ref 275–301)
POTASSIUM: 3.3 mmol/L — AB (ref 3.5–5.1)
SGOT(AST): 21 U/L (ref 15–37)
SODIUM: 135 mmol/L — AB (ref 136–145)
TOTAL PROTEIN: 6.4 g/dL (ref 6.4–8.2)

## 2014-08-20 LAB — LIPASE, BLOOD: LIPASE: 61 U/L — AB (ref 73–393)

## 2014-08-20 LAB — CBC WITH DIFFERENTIAL/PLATELET
BASOS ABS: 0 10*3/uL (ref 0.0–0.1)
Basophil %: 0.2 %
Eosinophil #: 0 10*3/uL (ref 0.0–0.7)
Eosinophil %: 0 %
HCT: 34.7 % — ABNORMAL LOW (ref 35.0–47.0)
HGB: 11.1 g/dL — ABNORMAL LOW (ref 12.0–16.0)
Lymphocyte #: 0.4 10*3/uL — ABNORMAL LOW (ref 1.0–3.6)
Lymphocyte %: 2.4 %
MCH: 32 pg (ref 26.0–34.0)
MCHC: 32.1 g/dL (ref 32.0–36.0)
MCV: 100 fL (ref 80–100)
MONOS PCT: 4.9 %
Monocyte #: 0.8 x10 3/mm (ref 0.2–0.9)
NEUTROS ABS: 14.4 10*3/uL — AB (ref 1.4–6.5)
Neutrophil %: 92.5 %
Platelet: 205 10*3/uL (ref 150–440)
RBC: 3.47 10*6/uL — ABNORMAL LOW (ref 3.80–5.20)
RDW: 13.1 % (ref 11.5–14.5)
WBC: 15.5 10*3/uL — AB (ref 3.6–11.0)

## 2014-08-20 LAB — RAPID INFLUENZA A&B ANTIGENS

## 2014-08-20 LAB — TROPONIN I

## 2014-08-21 LAB — CBC WITH DIFFERENTIAL/PLATELET
BASOS PCT: 0.1 %
Basophil #: 0 10*3/uL (ref 0.0–0.1)
Eosinophil #: 0 10*3/uL (ref 0.0–0.7)
Eosinophil %: 0 %
HCT: 33.6 % — ABNORMAL LOW (ref 35.0–47.0)
HGB: 10.9 g/dL — ABNORMAL LOW (ref 12.0–16.0)
LYMPHS ABS: 0.5 10*3/uL — AB (ref 1.0–3.6)
Lymphocyte %: 2.7 %
MCH: 33 pg (ref 26.0–34.0)
MCHC: 32.4 g/dL (ref 32.0–36.0)
MCV: 102 fL — ABNORMAL HIGH (ref 80–100)
Monocyte #: 0.4 x10 3/mm (ref 0.2–0.9)
Monocyte %: 1.8 %
NEUTROS ABS: 18.5 10*3/uL — AB (ref 1.4–6.5)
Neutrophil %: 95.4 %
PLATELETS: 200 10*3/uL (ref 150–440)
RBC: 3.29 10*6/uL — ABNORMAL LOW (ref 3.80–5.20)
RDW: 13.7 % (ref 11.5–14.5)
WBC: 19.4 10*3/uL — AB (ref 3.6–11.0)

## 2014-08-21 LAB — URINE CULTURE

## 2014-08-21 LAB — BASIC METABOLIC PANEL
Anion Gap: 12 (ref 7–16)
BUN: 10 mg/dL (ref 7–18)
CHLORIDE: 106 mmol/L (ref 98–107)
CO2: 16 mmol/L — AB (ref 21–32)
Calcium, Total: 7.8 mg/dL — ABNORMAL LOW (ref 8.5–10.1)
Creatinine: 0.79 mg/dL (ref 0.60–1.30)
EGFR (African American): >60
EGFR (Non-African Amer.): >60
Glucose: 44 mg/dL — ABNORMAL LOW (ref 65–99)
Osmolality: 264 (ref 275–301)
Potassium: 3.9 mmol/L (ref 3.5–5.1)
Sodium: 134 mmol/L — ABNORMAL LOW (ref 136–145)

## 2014-08-22 LAB — CBC WITH DIFFERENTIAL/PLATELET
Basophil #: 0.1 10*3/uL (ref 0.0–0.1)
Basophil %: 0.5 %
EOS ABS: 0 10*3/uL (ref 0.0–0.7)
Eosinophil %: 0 %
HCT: 31.6 % — AB (ref 35.0–47.0)
HGB: 10.5 g/dL — AB (ref 12.0–16.0)
Lymphocyte #: 0.7 10*3/uL — ABNORMAL LOW (ref 1.0–3.6)
Lymphocyte %: 4.1 %
MCH: 33.5 pg (ref 26.0–34.0)
MCHC: 33.2 g/dL (ref 32.0–36.0)
MCV: 101 fL — ABNORMAL HIGH (ref 80–100)
MONOS PCT: 3.6 %
Monocyte #: 0.6 x10 3/mm (ref 0.2–0.9)
NEUTROS ABS: 14.6 10*3/uL — AB (ref 1.4–6.5)
Neutrophil %: 91.8 %
PLATELETS: 194 10*3/uL (ref 150–440)
RBC: 3.13 10*6/uL — ABNORMAL LOW (ref 3.80–5.20)
RDW: 13.8 % (ref 11.5–14.5)
WBC: 16 10*3/uL — ABNORMAL HIGH (ref 3.6–11.0)

## 2014-08-22 LAB — BASIC METABOLIC PANEL
ANION GAP: 6 — AB (ref 7–16)
BUN: 8 mg/dL (ref 7–18)
CREATININE: 0.68 mg/dL (ref 0.60–1.30)
Calcium, Total: 7.5 mg/dL — ABNORMAL LOW (ref 8.5–10.1)
Chloride: 113 mmol/L — ABNORMAL HIGH (ref 98–107)
Co2: 21 mmol/L (ref 21–32)
EGFR (African American): >60
Glucose: 96 mg/dL (ref 65–99)
Osmolality: 278 (ref 275–301)
POTASSIUM: 4.2 mmol/L (ref 3.5–5.1)
Sodium: 140 mmol/L (ref 136–145)

## 2014-08-23 LAB — CBC WITH DIFFERENTIAL/PLATELET
Basophil #: 0 10*3/uL (ref 0.0–0.1)
Basophil %: 0.6 %
Eosinophil #: 0.1 10*3/uL (ref 0.0–0.7)
Eosinophil %: 0.9 %
HCT: 27 % — ABNORMAL LOW (ref 35.0–47.0)
HGB: 8.8 g/dL — ABNORMAL LOW (ref 12.0–16.0)
LYMPHS PCT: 11.5 %
Lymphocyte #: 0.9 10*3/uL — ABNORMAL LOW (ref 1.0–3.6)
MCH: 32.7 pg (ref 26.0–34.0)
MCHC: 32.6 g/dL (ref 32.0–36.0)
MCV: 100 fL (ref 80–100)
MONO ABS: 0.4 x10 3/mm (ref 0.2–0.9)
Monocyte %: 5.3 %
NEUTROS ABS: 6.7 10*3/uL — AB (ref 1.4–6.5)
Neutrophil %: 81.7 %
Platelet: 194 10*3/uL (ref 150–440)
RBC: 2.69 10*6/uL — ABNORMAL LOW (ref 3.80–5.20)
RDW: 13.6 % (ref 11.5–14.5)
WBC: 8.2 10*3/uL (ref 3.6–11.0)

## 2014-08-23 LAB — VANCOMYCIN, TROUGH: VANCOMYCIN, TROUGH: 4 ug/mL — AB (ref 10–20)

## 2014-08-25 LAB — CULTURE, BLOOD (SINGLE)

## 2014-10-05 ENCOUNTER — Ambulatory Visit: Payer: Self-pay | Admitting: Pain Medicine

## 2014-11-30 DIAGNOSIS — F1190 Opioid use, unspecified, uncomplicated: Secondary | ICD-10-CM | POA: Diagnosis not present

## 2014-11-30 DIAGNOSIS — G894 Chronic pain syndrome: Secondary | ICD-10-CM | POA: Diagnosis not present

## 2014-11-30 DIAGNOSIS — F1120 Opioid dependence, uncomplicated: Secondary | ICD-10-CM | POA: Diagnosis not present

## 2014-11-30 DIAGNOSIS — M545 Low back pain: Secondary | ICD-10-CM | POA: Diagnosis not present

## 2014-11-30 DIAGNOSIS — Z79899 Other long term (current) drug therapy: Secondary | ICD-10-CM | POA: Diagnosis not present

## 2014-11-30 DIAGNOSIS — Z7189 Other specified counseling: Secondary | ICD-10-CM | POA: Diagnosis not present

## 2014-11-30 DIAGNOSIS — Z5181 Encounter for therapeutic drug level monitoring: Secondary | ICD-10-CM | POA: Diagnosis not present

## 2014-11-30 DIAGNOSIS — M5416 Radiculopathy, lumbar region: Secondary | ICD-10-CM | POA: Diagnosis not present

## 2014-12-07 ENCOUNTER — Ambulatory Visit: Payer: Self-pay | Admitting: Pain Medicine

## 2014-12-07 DIAGNOSIS — M40204 Unspecified kyphosis, thoracic region: Secondary | ICD-10-CM | POA: Diagnosis not present

## 2014-12-07 DIAGNOSIS — M4186 Other forms of scoliosis, lumbar region: Secondary | ICD-10-CM | POA: Diagnosis not present

## 2014-12-07 DIAGNOSIS — M40294 Other kyphosis, thoracic region: Secondary | ICD-10-CM | POA: Diagnosis not present

## 2014-12-23 DIAGNOSIS — R29898 Other symptoms and signs involving the musculoskeletal system: Secondary | ICD-10-CM | POA: Diagnosis not present

## 2014-12-23 DIAGNOSIS — G629 Polyneuropathy, unspecified: Secondary | ICD-10-CM | POA: Diagnosis not present

## 2014-12-23 DIAGNOSIS — I2510 Atherosclerotic heart disease of native coronary artery without angina pectoris: Secondary | ICD-10-CM | POA: Diagnosis not present

## 2014-12-23 DIAGNOSIS — J449 Chronic obstructive pulmonary disease, unspecified: Secondary | ICD-10-CM | POA: Diagnosis not present

## 2014-12-27 DIAGNOSIS — F1120 Opioid dependence, uncomplicated: Secondary | ICD-10-CM | POA: Diagnosis not present

## 2014-12-27 DIAGNOSIS — G894 Chronic pain syndrome: Secondary | ICD-10-CM | POA: Diagnosis not present

## 2014-12-27 DIAGNOSIS — Z79899 Other long term (current) drug therapy: Secondary | ICD-10-CM | POA: Diagnosis not present

## 2014-12-27 DIAGNOSIS — R7982 Elevated C-reactive protein (CRP): Secondary | ICD-10-CM | POA: Diagnosis not present

## 2014-12-27 DIAGNOSIS — F419 Anxiety disorder, unspecified: Secondary | ICD-10-CM | POA: Diagnosis not present

## 2014-12-27 DIAGNOSIS — M5440 Lumbago with sciatica, unspecified side: Secondary | ICD-10-CM | POA: Diagnosis not present

## 2014-12-27 DIAGNOSIS — M5416 Radiculopathy, lumbar region: Secondary | ICD-10-CM | POA: Diagnosis not present

## 2014-12-28 DIAGNOSIS — I2510 Atherosclerotic heart disease of native coronary artery without angina pectoris: Secondary | ICD-10-CM | POA: Diagnosis not present

## 2014-12-28 DIAGNOSIS — G629 Polyneuropathy, unspecified: Secondary | ICD-10-CM | POA: Diagnosis not present

## 2014-12-28 DIAGNOSIS — J449 Chronic obstructive pulmonary disease, unspecified: Secondary | ICD-10-CM | POA: Diagnosis not present

## 2015-01-10 DIAGNOSIS — M545 Low back pain: Secondary | ICD-10-CM | POA: Diagnosis not present

## 2015-01-10 DIAGNOSIS — M5416 Radiculopathy, lumbar region: Secondary | ICD-10-CM | POA: Diagnosis not present

## 2015-01-10 DIAGNOSIS — F1120 Opioid dependence, uncomplicated: Secondary | ICD-10-CM | POA: Diagnosis not present

## 2015-01-10 DIAGNOSIS — Z79899 Other long term (current) drug therapy: Secondary | ICD-10-CM | POA: Diagnosis not present

## 2015-01-10 DIAGNOSIS — M47816 Spondylosis without myelopathy or radiculopathy, lumbar region: Secondary | ICD-10-CM | POA: Diagnosis not present

## 2015-01-10 DIAGNOSIS — G894 Chronic pain syndrome: Secondary | ICD-10-CM | POA: Diagnosis not present

## 2015-01-16 DIAGNOSIS — K219 Gastro-esophageal reflux disease without esophagitis: Secondary | ICD-10-CM | POA: Diagnosis not present

## 2015-01-16 DIAGNOSIS — I2510 Atherosclerotic heart disease of native coronary artery without angina pectoris: Secondary | ICD-10-CM | POA: Diagnosis not present

## 2015-01-16 DIAGNOSIS — I340 Nonrheumatic mitral (valve) insufficiency: Secondary | ICD-10-CM | POA: Diagnosis not present

## 2015-01-16 DIAGNOSIS — I739 Peripheral vascular disease, unspecified: Secondary | ICD-10-CM | POA: Diagnosis not present

## 2015-01-16 DIAGNOSIS — I10 Essential (primary) hypertension: Secondary | ICD-10-CM | POA: Diagnosis not present

## 2015-01-23 DIAGNOSIS — R29898 Other symptoms and signs involving the musculoskeletal system: Secondary | ICD-10-CM | POA: Diagnosis not present

## 2015-01-23 DIAGNOSIS — G629 Polyneuropathy, unspecified: Secondary | ICD-10-CM | POA: Diagnosis not present

## 2015-01-23 DIAGNOSIS — J449 Chronic obstructive pulmonary disease, unspecified: Secondary | ICD-10-CM | POA: Diagnosis not present

## 2015-01-23 DIAGNOSIS — I2510 Atherosclerotic heart disease of native coronary artery without angina pectoris: Secondary | ICD-10-CM | POA: Diagnosis not present

## 2015-01-27 ENCOUNTER — Emergency Department: Payer: Self-pay | Admitting: Student

## 2015-01-27 DIAGNOSIS — Z720 Tobacco use: Secondary | ICD-10-CM | POA: Diagnosis not present

## 2015-01-27 DIAGNOSIS — I10 Essential (primary) hypertension: Secondary | ICD-10-CM | POA: Diagnosis not present

## 2015-01-27 DIAGNOSIS — I83892 Varicose veins of left lower extremities with other complications: Secondary | ICD-10-CM | POA: Diagnosis not present

## 2015-01-27 DIAGNOSIS — I8392 Asymptomatic varicose veins of left lower extremity: Secondary | ICD-10-CM | POA: Diagnosis not present

## 2015-02-02 DIAGNOSIS — N390 Urinary tract infection, site not specified: Secondary | ICD-10-CM | POA: Diagnosis not present

## 2015-02-02 DIAGNOSIS — R4182 Altered mental status, unspecified: Secondary | ICD-10-CM | POA: Diagnosis not present

## 2015-02-03 ENCOUNTER — Inpatient Hospital Stay: Payer: Self-pay | Admitting: Internal Medicine

## 2015-02-03 DIAGNOSIS — G9349 Other encephalopathy: Secondary | ICD-10-CM | POA: Diagnosis not present

## 2015-02-03 DIAGNOSIS — N390 Urinary tract infection, site not specified: Secondary | ICD-10-CM | POA: Diagnosis not present

## 2015-02-03 DIAGNOSIS — I10 Essential (primary) hypertension: Secondary | ICD-10-CM | POA: Diagnosis not present

## 2015-02-03 DIAGNOSIS — I739 Peripheral vascular disease, unspecified: Secondary | ICD-10-CM | POA: Diagnosis not present

## 2015-02-03 DIAGNOSIS — J439 Emphysema, unspecified: Secondary | ICD-10-CM | POA: Diagnosis not present

## 2015-02-03 DIAGNOSIS — F17210 Nicotine dependence, cigarettes, uncomplicated: Secondary | ICD-10-CM | POA: Diagnosis not present

## 2015-02-03 DIAGNOSIS — E441 Mild protein-calorie malnutrition: Secondary | ICD-10-CM | POA: Diagnosis not present

## 2015-02-03 DIAGNOSIS — G9340 Encephalopathy, unspecified: Secondary | ICD-10-CM | POA: Diagnosis not present

## 2015-02-03 DIAGNOSIS — J449 Chronic obstructive pulmonary disease, unspecified: Secondary | ICD-10-CM | POA: Diagnosis not present

## 2015-02-03 DIAGNOSIS — G629 Polyneuropathy, unspecified: Secondary | ICD-10-CM | POA: Diagnosis not present

## 2015-02-03 DIAGNOSIS — K219 Gastro-esophageal reflux disease without esophagitis: Secondary | ICD-10-CM | POA: Diagnosis not present

## 2015-02-03 DIAGNOSIS — Z720 Tobacco use: Secondary | ICD-10-CM | POA: Diagnosis not present

## 2015-02-03 DIAGNOSIS — G8929 Other chronic pain: Secondary | ICD-10-CM | POA: Diagnosis not present

## 2015-02-03 DIAGNOSIS — G894 Chronic pain syndrome: Secondary | ICD-10-CM | POA: Diagnosis not present

## 2015-02-03 DIAGNOSIS — I517 Cardiomegaly: Secondary | ICD-10-CM | POA: Diagnosis not present

## 2015-02-03 DIAGNOSIS — R4182 Altered mental status, unspecified: Secondary | ICD-10-CM | POA: Diagnosis not present

## 2015-02-07 DIAGNOSIS — G8929 Other chronic pain: Secondary | ICD-10-CM | POA: Diagnosis not present

## 2015-02-07 DIAGNOSIS — J449 Chronic obstructive pulmonary disease, unspecified: Secondary | ICD-10-CM | POA: Diagnosis not present

## 2015-02-07 DIAGNOSIS — N390 Urinary tract infection, site not specified: Secondary | ICD-10-CM | POA: Diagnosis not present

## 2015-02-07 DIAGNOSIS — A499 Bacterial infection, unspecified: Secondary | ICD-10-CM | POA: Diagnosis not present

## 2015-02-07 DIAGNOSIS — I10 Essential (primary) hypertension: Secondary | ICD-10-CM | POA: Diagnosis not present

## 2015-02-07 DIAGNOSIS — G629 Polyneuropathy, unspecified: Secondary | ICD-10-CM | POA: Diagnosis not present

## 2015-02-07 DIAGNOSIS — F17210 Nicotine dependence, cigarettes, uncomplicated: Secondary | ICD-10-CM | POA: Diagnosis not present

## 2015-02-09 DIAGNOSIS — A499 Bacterial infection, unspecified: Secondary | ICD-10-CM | POA: Diagnosis not present

## 2015-02-09 DIAGNOSIS — G629 Polyneuropathy, unspecified: Secondary | ICD-10-CM | POA: Diagnosis not present

## 2015-02-09 DIAGNOSIS — F17210 Nicotine dependence, cigarettes, uncomplicated: Secondary | ICD-10-CM | POA: Diagnosis not present

## 2015-02-09 DIAGNOSIS — N390 Urinary tract infection, site not specified: Secondary | ICD-10-CM | POA: Diagnosis not present

## 2015-02-09 DIAGNOSIS — J449 Chronic obstructive pulmonary disease, unspecified: Secondary | ICD-10-CM | POA: Diagnosis not present

## 2015-02-09 DIAGNOSIS — G8929 Other chronic pain: Secondary | ICD-10-CM | POA: Diagnosis not present

## 2015-02-09 DIAGNOSIS — I10 Essential (primary) hypertension: Secondary | ICD-10-CM | POA: Diagnosis not present

## 2015-02-10 DIAGNOSIS — A499 Bacterial infection, unspecified: Secondary | ICD-10-CM | POA: Diagnosis not present

## 2015-02-10 DIAGNOSIS — N390 Urinary tract infection, site not specified: Secondary | ICD-10-CM | POA: Diagnosis not present

## 2015-02-10 DIAGNOSIS — I10 Essential (primary) hypertension: Secondary | ICD-10-CM | POA: Diagnosis not present

## 2015-02-10 DIAGNOSIS — G629 Polyneuropathy, unspecified: Secondary | ICD-10-CM | POA: Diagnosis not present

## 2015-02-10 DIAGNOSIS — J449 Chronic obstructive pulmonary disease, unspecified: Secondary | ICD-10-CM | POA: Diagnosis not present

## 2015-02-10 DIAGNOSIS — G8929 Other chronic pain: Secondary | ICD-10-CM | POA: Diagnosis not present

## 2015-02-10 DIAGNOSIS — F17210 Nicotine dependence, cigarettes, uncomplicated: Secondary | ICD-10-CM | POA: Diagnosis not present

## 2015-02-13 ENCOUNTER — Ambulatory Visit: Payer: Self-pay | Admitting: Family Medicine

## 2015-02-13 DIAGNOSIS — I517 Cardiomegaly: Secondary | ICD-10-CM | POA: Diagnosis not present

## 2015-02-13 DIAGNOSIS — R0602 Shortness of breath: Secondary | ICD-10-CM | POA: Diagnosis not present

## 2015-02-13 DIAGNOSIS — R0609 Other forms of dyspnea: Secondary | ICD-10-CM | POA: Diagnosis not present

## 2015-02-13 DIAGNOSIS — N390 Urinary tract infection, site not specified: Secondary | ICD-10-CM | POA: Diagnosis not present

## 2015-02-13 DIAGNOSIS — J449 Chronic obstructive pulmonary disease, unspecified: Secondary | ICD-10-CM | POA: Diagnosis not present

## 2015-02-13 DIAGNOSIS — R0902 Hypoxemia: Secondary | ICD-10-CM | POA: Diagnosis not present

## 2015-02-13 DIAGNOSIS — R05 Cough: Secondary | ICD-10-CM | POA: Diagnosis not present

## 2015-02-13 DIAGNOSIS — D649 Anemia, unspecified: Secondary | ICD-10-CM | POA: Diagnosis not present

## 2015-02-14 DIAGNOSIS — R110 Nausea: Secondary | ICD-10-CM | POA: Diagnosis not present

## 2015-02-14 DIAGNOSIS — N390 Urinary tract infection, site not specified: Secondary | ICD-10-CM | POA: Diagnosis not present

## 2015-02-14 DIAGNOSIS — J449 Chronic obstructive pulmonary disease, unspecified: Secondary | ICD-10-CM | POA: Diagnosis not present

## 2015-02-14 DIAGNOSIS — G609 Hereditary and idiopathic neuropathy, unspecified: Secondary | ICD-10-CM | POA: Diagnosis not present

## 2015-02-15 DIAGNOSIS — F17210 Nicotine dependence, cigarettes, uncomplicated: Secondary | ICD-10-CM | POA: Diagnosis not present

## 2015-02-15 DIAGNOSIS — G8929 Other chronic pain: Secondary | ICD-10-CM | POA: Diagnosis not present

## 2015-02-15 DIAGNOSIS — I10 Essential (primary) hypertension: Secondary | ICD-10-CM | POA: Diagnosis not present

## 2015-02-15 DIAGNOSIS — J449 Chronic obstructive pulmonary disease, unspecified: Secondary | ICD-10-CM | POA: Diagnosis not present

## 2015-02-15 DIAGNOSIS — D500 Iron deficiency anemia secondary to blood loss (chronic): Secondary | ICD-10-CM | POA: Diagnosis not present

## 2015-02-15 DIAGNOSIS — A499 Bacterial infection, unspecified: Secondary | ICD-10-CM | POA: Diagnosis not present

## 2015-02-15 DIAGNOSIS — G629 Polyneuropathy, unspecified: Secondary | ICD-10-CM | POA: Diagnosis not present

## 2015-02-15 DIAGNOSIS — N390 Urinary tract infection, site not specified: Secondary | ICD-10-CM | POA: Diagnosis not present

## 2015-02-16 DIAGNOSIS — J449 Chronic obstructive pulmonary disease, unspecified: Secondary | ICD-10-CM | POA: Diagnosis not present

## 2015-02-16 DIAGNOSIS — G629 Polyneuropathy, unspecified: Secondary | ICD-10-CM | POA: Diagnosis not present

## 2015-02-16 DIAGNOSIS — A499 Bacterial infection, unspecified: Secondary | ICD-10-CM | POA: Diagnosis not present

## 2015-02-16 DIAGNOSIS — F17210 Nicotine dependence, cigarettes, uncomplicated: Secondary | ICD-10-CM | POA: Diagnosis not present

## 2015-02-16 DIAGNOSIS — G8929 Other chronic pain: Secondary | ICD-10-CM | POA: Diagnosis not present

## 2015-02-16 DIAGNOSIS — I10 Essential (primary) hypertension: Secondary | ICD-10-CM | POA: Diagnosis not present

## 2015-02-16 DIAGNOSIS — N390 Urinary tract infection, site not specified: Secondary | ICD-10-CM | POA: Diagnosis not present

## 2015-02-21 DIAGNOSIS — I2510 Atherosclerotic heart disease of native coronary artery without angina pectoris: Secondary | ICD-10-CM | POA: Diagnosis not present

## 2015-02-21 DIAGNOSIS — J449 Chronic obstructive pulmonary disease, unspecified: Secondary | ICD-10-CM | POA: Diagnosis not present

## 2015-02-21 DIAGNOSIS — R29898 Other symptoms and signs involving the musculoskeletal system: Secondary | ICD-10-CM | POA: Diagnosis not present

## 2015-02-21 DIAGNOSIS — G629 Polyneuropathy, unspecified: Secondary | ICD-10-CM | POA: Diagnosis not present

## 2015-02-23 DIAGNOSIS — G8929 Other chronic pain: Secondary | ICD-10-CM | POA: Diagnosis not present

## 2015-02-23 DIAGNOSIS — N390 Urinary tract infection, site not specified: Secondary | ICD-10-CM | POA: Diagnosis not present

## 2015-02-23 DIAGNOSIS — A499 Bacterial infection, unspecified: Secondary | ICD-10-CM | POA: Diagnosis not present

## 2015-02-23 DIAGNOSIS — J449 Chronic obstructive pulmonary disease, unspecified: Secondary | ICD-10-CM | POA: Diagnosis not present

## 2015-02-23 DIAGNOSIS — F17210 Nicotine dependence, cigarettes, uncomplicated: Secondary | ICD-10-CM | POA: Diagnosis not present

## 2015-02-23 DIAGNOSIS — G629 Polyneuropathy, unspecified: Secondary | ICD-10-CM | POA: Diagnosis not present

## 2015-02-23 DIAGNOSIS — I10 Essential (primary) hypertension: Secondary | ICD-10-CM | POA: Diagnosis not present

## 2015-02-24 DIAGNOSIS — G8929 Other chronic pain: Secondary | ICD-10-CM | POA: Diagnosis not present

## 2015-02-24 DIAGNOSIS — J449 Chronic obstructive pulmonary disease, unspecified: Secondary | ICD-10-CM | POA: Diagnosis not present

## 2015-02-24 DIAGNOSIS — F17210 Nicotine dependence, cigarettes, uncomplicated: Secondary | ICD-10-CM | POA: Diagnosis not present

## 2015-02-24 DIAGNOSIS — N390 Urinary tract infection, site not specified: Secondary | ICD-10-CM | POA: Diagnosis not present

## 2015-02-24 DIAGNOSIS — I10 Essential (primary) hypertension: Secondary | ICD-10-CM | POA: Diagnosis not present

## 2015-02-24 DIAGNOSIS — G629 Polyneuropathy, unspecified: Secondary | ICD-10-CM | POA: Diagnosis not present

## 2015-02-24 DIAGNOSIS — A499 Bacterial infection, unspecified: Secondary | ICD-10-CM | POA: Diagnosis not present

## 2015-02-27 DIAGNOSIS — J449 Chronic obstructive pulmonary disease, unspecified: Secondary | ICD-10-CM | POA: Diagnosis not present

## 2015-02-27 DIAGNOSIS — I10 Essential (primary) hypertension: Secondary | ICD-10-CM | POA: Diagnosis not present

## 2015-02-27 DIAGNOSIS — F17210 Nicotine dependence, cigarettes, uncomplicated: Secondary | ICD-10-CM | POA: Diagnosis not present

## 2015-02-27 DIAGNOSIS — A499 Bacterial infection, unspecified: Secondary | ICD-10-CM | POA: Diagnosis not present

## 2015-02-27 DIAGNOSIS — G8929 Other chronic pain: Secondary | ICD-10-CM | POA: Diagnosis not present

## 2015-02-27 DIAGNOSIS — N390 Urinary tract infection, site not specified: Secondary | ICD-10-CM | POA: Diagnosis not present

## 2015-02-27 DIAGNOSIS — G629 Polyneuropathy, unspecified: Secondary | ICD-10-CM | POA: Diagnosis not present

## 2015-02-28 DIAGNOSIS — A499 Bacterial infection, unspecified: Secondary | ICD-10-CM | POA: Diagnosis not present

## 2015-02-28 DIAGNOSIS — F17210 Nicotine dependence, cigarettes, uncomplicated: Secondary | ICD-10-CM | POA: Diagnosis not present

## 2015-02-28 DIAGNOSIS — G629 Polyneuropathy, unspecified: Secondary | ICD-10-CM | POA: Diagnosis not present

## 2015-02-28 DIAGNOSIS — J449 Chronic obstructive pulmonary disease, unspecified: Secondary | ICD-10-CM | POA: Diagnosis not present

## 2015-02-28 DIAGNOSIS — G8929 Other chronic pain: Secondary | ICD-10-CM | POA: Diagnosis not present

## 2015-02-28 DIAGNOSIS — N390 Urinary tract infection, site not specified: Secondary | ICD-10-CM | POA: Diagnosis not present

## 2015-02-28 DIAGNOSIS — I10 Essential (primary) hypertension: Secondary | ICD-10-CM | POA: Diagnosis not present

## 2015-03-01 ENCOUNTER — Ambulatory Visit
Admit: 2015-03-01 | Disposition: A | Discharge status: Home / Self Care | Payer: Self-pay | Attending: Family Medicine | Admitting: Family Medicine

## 2015-03-01 DIAGNOSIS — R0609 Other forms of dyspnea: Secondary | ICD-10-CM | POA: Diagnosis not present

## 2015-03-01 DIAGNOSIS — R040 Epistaxis: Secondary | ICD-10-CM | POA: Diagnosis not present

## 2015-03-01 DIAGNOSIS — S20211A Contusion of right front wall of thorax, initial encounter: Secondary | ICD-10-CM | POA: Diagnosis not present

## 2015-03-01 DIAGNOSIS — N390 Urinary tract infection, site not specified: Secondary | ICD-10-CM | POA: Diagnosis not present

## 2015-03-01 DIAGNOSIS — S2241XA Multiple fractures of ribs, right side, initial encounter for closed fracture: Secondary | ICD-10-CM | POA: Diagnosis not present

## 2015-03-03 DIAGNOSIS — I10 Essential (primary) hypertension: Secondary | ICD-10-CM | POA: Diagnosis not present

## 2015-03-03 DIAGNOSIS — G629 Polyneuropathy, unspecified: Secondary | ICD-10-CM | POA: Diagnosis not present

## 2015-03-03 DIAGNOSIS — A499 Bacterial infection, unspecified: Secondary | ICD-10-CM | POA: Diagnosis not present

## 2015-03-03 DIAGNOSIS — F17210 Nicotine dependence, cigarettes, uncomplicated: Secondary | ICD-10-CM | POA: Diagnosis not present

## 2015-03-03 DIAGNOSIS — N390 Urinary tract infection, site not specified: Secondary | ICD-10-CM | POA: Diagnosis not present

## 2015-03-03 DIAGNOSIS — G8929 Other chronic pain: Secondary | ICD-10-CM | POA: Diagnosis not present

## 2015-03-03 DIAGNOSIS — J449 Chronic obstructive pulmonary disease, unspecified: Secondary | ICD-10-CM | POA: Diagnosis not present

## 2015-03-07 DIAGNOSIS — N390 Urinary tract infection, site not specified: Secondary | ICD-10-CM | POA: Diagnosis not present

## 2015-03-07 DIAGNOSIS — F17210 Nicotine dependence, cigarettes, uncomplicated: Secondary | ICD-10-CM | POA: Diagnosis not present

## 2015-03-07 DIAGNOSIS — J449 Chronic obstructive pulmonary disease, unspecified: Secondary | ICD-10-CM | POA: Diagnosis not present

## 2015-03-07 DIAGNOSIS — A499 Bacterial infection, unspecified: Secondary | ICD-10-CM | POA: Diagnosis not present

## 2015-03-07 DIAGNOSIS — G629 Polyneuropathy, unspecified: Secondary | ICD-10-CM | POA: Diagnosis not present

## 2015-03-07 DIAGNOSIS — G8929 Other chronic pain: Secondary | ICD-10-CM | POA: Diagnosis not present

## 2015-03-07 DIAGNOSIS — I10 Essential (primary) hypertension: Secondary | ICD-10-CM | POA: Diagnosis not present

## 2015-03-10 DIAGNOSIS — G629 Polyneuropathy, unspecified: Secondary | ICD-10-CM | POA: Diagnosis not present

## 2015-03-10 DIAGNOSIS — J449 Chronic obstructive pulmonary disease, unspecified: Secondary | ICD-10-CM | POA: Diagnosis not present

## 2015-03-10 DIAGNOSIS — I10 Essential (primary) hypertension: Secondary | ICD-10-CM | POA: Diagnosis not present

## 2015-03-10 DIAGNOSIS — N390 Urinary tract infection, site not specified: Secondary | ICD-10-CM | POA: Diagnosis not present

## 2015-03-10 DIAGNOSIS — F17210 Nicotine dependence, cigarettes, uncomplicated: Secondary | ICD-10-CM | POA: Diagnosis not present

## 2015-03-10 DIAGNOSIS — G8929 Other chronic pain: Secondary | ICD-10-CM | POA: Diagnosis not present

## 2015-03-10 DIAGNOSIS — A499 Bacterial infection, unspecified: Secondary | ICD-10-CM | POA: Diagnosis not present

## 2015-03-14 DIAGNOSIS — J449 Chronic obstructive pulmonary disease, unspecified: Secondary | ICD-10-CM | POA: Diagnosis not present

## 2015-03-14 DIAGNOSIS — A499 Bacterial infection, unspecified: Secondary | ICD-10-CM | POA: Diagnosis not present

## 2015-03-14 DIAGNOSIS — I10 Essential (primary) hypertension: Secondary | ICD-10-CM | POA: Diagnosis not present

## 2015-03-14 DIAGNOSIS — G8929 Other chronic pain: Secondary | ICD-10-CM | POA: Diagnosis not present

## 2015-03-14 DIAGNOSIS — F17210 Nicotine dependence, cigarettes, uncomplicated: Secondary | ICD-10-CM | POA: Diagnosis not present

## 2015-03-14 DIAGNOSIS — N390 Urinary tract infection, site not specified: Secondary | ICD-10-CM | POA: Diagnosis not present

## 2015-03-14 DIAGNOSIS — G629 Polyneuropathy, unspecified: Secondary | ICD-10-CM | POA: Diagnosis not present

## 2015-03-16 DIAGNOSIS — N390 Urinary tract infection, site not specified: Secondary | ICD-10-CM | POA: Diagnosis not present

## 2015-03-16 DIAGNOSIS — A499 Bacterial infection, unspecified: Secondary | ICD-10-CM | POA: Diagnosis not present

## 2015-03-16 DIAGNOSIS — G629 Polyneuropathy, unspecified: Secondary | ICD-10-CM | POA: Diagnosis not present

## 2015-03-16 DIAGNOSIS — I10 Essential (primary) hypertension: Secondary | ICD-10-CM | POA: Diagnosis not present

## 2015-03-16 DIAGNOSIS — G8929 Other chronic pain: Secondary | ICD-10-CM | POA: Diagnosis not present

## 2015-03-16 DIAGNOSIS — J449 Chronic obstructive pulmonary disease, unspecified: Secondary | ICD-10-CM | POA: Diagnosis not present

## 2015-03-16 DIAGNOSIS — F17210 Nicotine dependence, cigarettes, uncomplicated: Secondary | ICD-10-CM | POA: Diagnosis not present

## 2015-03-17 DIAGNOSIS — A499 Bacterial infection, unspecified: Secondary | ICD-10-CM | POA: Diagnosis not present

## 2015-03-17 DIAGNOSIS — J449 Chronic obstructive pulmonary disease, unspecified: Secondary | ICD-10-CM | POA: Diagnosis not present

## 2015-03-17 DIAGNOSIS — N390 Urinary tract infection, site not specified: Secondary | ICD-10-CM | POA: Diagnosis not present

## 2015-03-17 DIAGNOSIS — R110 Nausea: Secondary | ICD-10-CM | POA: Diagnosis not present

## 2015-03-17 DIAGNOSIS — G8929 Other chronic pain: Secondary | ICD-10-CM | POA: Diagnosis not present

## 2015-03-17 DIAGNOSIS — F17210 Nicotine dependence, cigarettes, uncomplicated: Secondary | ICD-10-CM | POA: Diagnosis not present

## 2015-03-17 DIAGNOSIS — G629 Polyneuropathy, unspecified: Secondary | ICD-10-CM | POA: Diagnosis not present

## 2015-03-17 DIAGNOSIS — G609 Hereditary and idiopathic neuropathy, unspecified: Secondary | ICD-10-CM | POA: Diagnosis not present

## 2015-03-17 DIAGNOSIS — I10 Essential (primary) hypertension: Secondary | ICD-10-CM | POA: Diagnosis not present

## 2015-03-17 NOTE — H&P (Signed)
PATIENT NAME:  Cheryl [name], Cheryl [name] MR#:  [id number] DATE OF BIRTH:  1951/07/01  DATE OF ADMISSION:  04/02/2013  PRIMARY DOCTOR:  Dr. Mila Merryonald Fisher.    CHIEF COMPLAINT: Altered mental status.   HISTORY OF PRESENT ILLNESS: The patient is a 64 year old female patient was brought in by the husband because of lethargy and confusion since yesterday afternoon.  The patient is scheduled to have cardiac CTt with Dr. Adrian BlackwaterShaukat Khan, but because of her poor responsiveness and decreased poor p.o. intake since yesterday, husband brought her to the Emergency Room.  According to the husband, the patient was doing okay yesterday before he went to work.  She had her breakfast and was oriented.  By the time he came back from work, the patient was sleeping and was poorly responsive. The patient did not have much intake since yesterday afternoon and she was just staring into space.  The patient noted to have only mumbling and also had a very unsteady gait. According to the daughter, who was there during the daytime yesterday, the patient did have some shaking of the upper extremity and then followed by some decreased responsiveness after that. The patient did not have any fever, no diarrhea. No abdominal pain. No headache. No blurred vision. The patient was here recently for the same complaint in March.  From March 4 to 7th, discharged after negative  workup .me. The patient went home and had an EEG last Tuesday with Dr. Cristopher PeruHemang Shah for the etiology of her lethargyEEG results are ending,pt does not know the EEg results. She also had a follow up with pain management specialist, Dr. Shireen QuanNaviera.  The patient was seen by Dr. Shireen QuanNaviera on 04/16 and her tramadol has been increased from 100 mg t.i.d. to 100 mg q.6 hours.  There is a  possibility since then the patient has been having more lethargy.  The patient is on pain medicines for her back pain, but according to the husband, since yesterday afternoon, the patient did not take any tramadol or  Xanax and they have a box for the pills and they are sure that she did not take any extra doses of tramadol or Xanax. The patient had an LP in the ER, which is clear. CT head is clear. She is following some commands at this time.  We are going to keep her overnight for this altered mental status. We will get an MRI of the brain along with follow up with neurology.   PAST MEDICAL HISTORY:  Positive for history of DVT.  She was on Effient before.   History of pneumonia before, chronic obstructive pulmonary disease, history of heavy smoking and also a history of reflux. She also has a chronic back pain, history of epidural injections before, followed up with pain management specialist.  She also has started to have dementia and started on Aricept.   ALLERGIES:  SHE IS ALLERGIC TO ASPIRIN, CODEINE, MORPHINE, PERCOCET AND SULFA.   SOCIAL HISTORY: She lives with husband and smokes about 1 pack a day.  She was a heavy smoker before, used to smoke 3 packs per day now is cutting down. According to the husband, she smokes a pack a day.   PAST SURGICAL HISTORY: Significant for hysterectomy and cholecystectomy, history of appendectomy and also epidural injections.   MEDICATIONS: Xanax 0.25 mg every 6 hours as needed, Celebrex 200 mg p.o. daily, Aricept 5 mg p.o. daily, estropipate 1.5 mg p.o. daily, Neurontin 300 mg p.o. t.i.d., Lasix 40 mg p.o. daily, Plavix  75 mg p.o. daily, Prilosec 20 mg p.o. daily, ProAir 90 mcg inhalation, 2 puffs every 6 hours as needed, Spiriva one puff   inhalation  daily, tramadol 50 mg 2 tablets every 6 hours.  Medication reconciliation shows as b.i.d., but the husband said the patient is taking 4 times a day.   REVIEW OF SYSTEMS: Unable to obtain clearly because of her lethargy.    PHYSICAL EXAMINATION:  VITAL SIGNS:  Temperature 97.9, heart rate 55, respirations 18, blood pressure is 134/65, sats 100% on room air. The patient is sleeping, but arousable and answers some of the  questions appropriately. She is not oriented to place, unable to remember her birthday, but she was able to follow the commands when I asked her the count of my fingers she would say, and she says light is bothering her and she would recognize her family members.   HEENT: Head atraumatic, normocephalic. Pupils equally reacting to light. Extraocular movements are intact.   ENT: No tympanic membrane congestion. No turbinate hypertrophy. No oropharyngeal erythema.   NECK: Normal range of motion. No JVD. No carotid bruit.   CARDIOVASCULAR: S1, S2 regular. No murmurs.   LUNGS: Bilateral expiratory wheeze present in lower lung zones.   ABDOMEN: Soft. Bowel sounds present. No hernias.   Nontender, nondistended.   EXTREMITIES: No extremity edema. No cyanosis. No clubbing.   NEUROLOGIC: The patient is somewhat lethargic and has memory defect. Deep tendon reflexes 2+ bilaterally.  The patient is oriented to place and time.  I do not see any facial palsy and unable to do full motor exam, as she is not able to follow commands.   SKIN: Warm and dry.   EXTREMITIES: No pedal edema, nontender, no tenderness.   LABORATORY, DIAGNOSTIC AND RADIOLOGICAL DATA: CSF 24.  She had LP and it  shows WBC 0, CSF glucose is 52, CSF protein is 24. CT head is no acute intracranial abnormality. Chest x-ray shows no acute cardiopulmonary abnormality.  Urine is hazy-colored urine.  Urine toxicology positive for cannabinoids.  WBC 4.7, hemoglobin 11.3, hematocrit 32.9, platelets 245.   ELECTROLYTES: Sodium 140, potassium 3.6, chloride 108, bicarbonate 26, BUN 14, creatinine 0.9, glucose 91. Troponin less than 0.02. EKG sinus brady at 55 beats per minute.   ASSESSMENT AND PLAN: A 64 year old female patient with altered mental status. The patient had an LP, which is clear. CT head is clear.  The likely etiology could be congestive heart failure versus opioid induced as the tramadol dose has been increased recently. The patient  had an EEG with Dr. Cristopher Peru last week and she also had an EEG on 01/28. It showed that the patient had some left temporal interictal epileptiform activity indicating increased cortical irritability with predisposition to epilepsy on the left temporal lesion, so we need to  see if this is related to subclinical seizures. Patient will have a neurology consult, follow eeg done resently,and also MRI of the brain. The patient will be continued on IV fluids empirically and normal saline 60.  Will hold the tramadol at this time. neuro checks will be continued every 1 hour for 4 hours, then after that every four hours.  DIAGNOSIS:  Includes: 1. Chronic obstructive pulmonary disease, continue inhalers.  2. Hypertension. Hold the Lasix because she is not eating since yesterday afternoon to avoid dehydration.  Just continue fluids. Her blood pressure is stable so we will not give the Lasix. The patient  had an MRI brain done in January and it showed  small ischemic changes, likely due to migraine headaches cannot be excluded and demyelination is also consideration   so we will not repeat that.   The patientwill have neuro check s,  neurology evaluation and also will get EEG from Dr. Anselm Jungling office and hold the pain medications and see how she does.   Time Spent on the history and physical: About 55 minutes.    ____________________________ Katha Hamming, MD sk:rw D: 04/02/2013 13:01:00 ET T: 04/02/2013 13:50:41 ET JOB#: [id number]  cc: Katha Hamming, MD, <Dictator> Demetrios Isaacs. Sherrie Mustache, MD  Katha Hamming MD ELECTRONICALLY SIGNED 04/11/2013 17:53

## 2015-03-17 NOTE — Discharge Summary (Signed)
PATIENT NAME:  Cheryl [name], Cheryl [name] MR#:  [id number] DATE OF BIRTH:  [birth year]  DATE OF ADMISSION:  01/26/2013 DATE OF DISCHARGE: 01/29/2013    DISPOSITION: Home with home health and husband.   DISCHARGE DIAGNOSES:  1. Metabolic encephalopathy, likely due to viral syndrome, and on top of that, dehydration.  Other diagnoses include: 2. Anemia due to dilution.  3. Nausea, vomiting, due to viral syndrome, gastroenteritis.  4. Chronic obstructive pulmonary disease.  5. Tobacco abuse.  6. History of deep vein thrombosis.  7. Gastroesophageal reflux disease.  8. Status post aortic stents.  9. Osteoarthritis.  10. Coronary artery disease/peripheral vascular disease, on Plavix.  11. Chronic edema, on Lasix.   MEDICATIONS AT DISCHARGE: 1. Vitamin D3 1000 units once daily. 2. Spiriva 18 mcg once daily. 3. Lasix 20 mg once daily. 4. Celebrex 200 mg once daily. 5. Plavix 75 mg once daily.  6. Gabapentin 800 mg 4 times a day. 7. Estropipate 1.5 mg once a day. 8. Tramadol 50 mg p.r.n. pain. 9. Alprazolam 0.25 mg as needed for anxiety. 10. ProAir HFA 19 mcg 4 times a day.  Protonix 40 mg once a day.   FOLLOWUP: With primary care physician, Dr. Mila Merryonald Fisher, in 1 to 2 weeks.   HOSPITAL COURSE: The patient is a very nice 64 year old female who has history of COPD, reflux, pneumonia in the past. She had previous DVT. She also has history of stents in her aorta in the past. She has chronic back pain for which she gets epidural injections. She comes to the ER on 01/26/2013 with a history of altered mental status, lethargy and fever. The patient was not able to answer questions appropriately. She was with her husband, and she was not feeling well for the past 24 hours prior to admission and started getting confused. She has some baseline dementia, but this was completely exacerbated, and it was very severe from her baseline. Apparently, she went to pick up something in the refrigerator, and then her husband  want to look for her, and she was found down in the kitchen. She had been vomiting prior to that and not feeling well since breakfast. The patient had a temperature of 101.3 in the ER, her heart rate was in the 90s. CT scan of her head was negative. The patient was started on Rocephin and Zithromax empirically, but those medications were stopped at discharge as the patient did not show anything on her cultures.  1. Altered mental status related to metabolic encephalopathy due to viral syndrome, and on top of that, dehydration. The patient was admitted. IV fluids were given. Workup for infection was done, showing negative UA, negative chest x-ray, negative blood cultures and urine culture. The antibiotics were stopped. The patient has positive sick contact with the same symptoms, one of her family members, a child in the house. The patient had at least 48 hours with altered mental status, and now she is back on her baseline.  2. She was clinically dehydrated, for which IV fluids were given.  3. Her nausea and vomiting were likely due to viral syndrome and have resolved.  4. She is a smoker. She has COPD, for which she has been given counseling for smoking cessation. Her COPD was not exacerbated during this hospitalization.  5. Other medical problems were stable.   TIME SPENT: I spent about 40 minutes with this discharge.   ____________________________ Felipa Furnaceoberto Sanchez Gutierrez, MD rsg:OSi D: 01/29/2013 12:39:17 ET T: 01/29/2013 13:46:00 ET JOB#: [id number]  cc: Felipa Furnace, MD, <Dictator> Demetrios Isaacs. Sherrie Mustache, MD Pearletha Furl MD ELECTRONICALLY SIGNED 02/09/2013 13:05

## 2015-03-17 NOTE — H&P (Signed)
PATIENT NAME:  Cheryl [name], Cheryl [name] MR#:  [id number] DATE OF BIRTH:  [birth year]  DATE OF ADMISSION:  01/26/2013  PRIMARY CARE PHYSICIAN:  Demetrios Isaacs. Sherrie Mustache, MD  CHIEF COMPLAINT: Lethargy and fever today.   HISTORY OF PRESENT ILLNESS: The patient is a 64 year old Caucasian female with past medical history of acid reflux, history of ongoing tobacco abuse, along with chronic back pain, comes to the Emergency Room accompanied by her husband. Her husband called home. The patient did not seem to answer appropriately. Once the patient's husband  went home, found the patient not feeling well and appeared to be lethargic with some confusion. She was trying to clean the kitchen along with the refrigerator and stuff was found down on the kitchen floor per husband. The patient earlier reported that she had vomiting x 1 and was not feeling well since after breakfast The last meal she had was breakfast. In the Emergency Room she was found to have temperature 101.3. She is somewhat tachycardic with heart rate in the 90s. She denies any headache or neck pain. The patient has chronic headaches and had one headache yesterday. She denies any abdominal pain, dysuria, any new cough, other than her chronic smoker's cough. The patient's husband reports their grandchildren were sick with viral syndrome over the weekend and that was a sick contact the patient was in a day in contact with. The patient empirically received IV Rocephin and Zithromax in the Emergency Room. No source of infection so far identified. CT head is negative.   PAST MEDICAL HISTORY: 1.  History of deep vein thrombosis.  2.  Acid reflux. 3.  History of pneumonia in the past.  4.  Appendectomy.  5.  Hysterectomy total.  6.  Cholecystectomy. 7. Chronic back pain.  The patient follows up with the pain clinic, Dr. Laban Emperor. She has had epidural injections in the past.   MEDICATIONS: 1.  Vitamin D3, 1000 p.o. daily.  2.  Tramadol 50 mg 2 tablets 3 times a day.  3.   Spiriva 18 mcg inhalation once daily.  4.  Pro air HFA 2 puffs every 4 times a day.  5.  Plavix 75 mg daily. 6.  Protonix 40 mg daily.  7.  Lasix 20 mg daily.  8.  Gabapentin 800 mg 4 times a day.   9.  Estropipate 1.5 mg p.o. daily.  10.  Celebrex 200 mg daily.  11.  Alprazolam 0.25 mg 1 tablet every 6 hours as needed.   SOCIAL HISTORY: Lives at home with her husband. She smokes about a pack a day. Denies any alcohol use.   FAMILY HISTORY: Positive for hypertension.   ALLERGIES: ASPIRIN, CODEINE, MORPHINE, PERCOCET AND SULFA DRUGS.   REVIEW OF SYSTEMS:  Limited.  CONSTITUATIONAL:  The patient has fever. Positive for fatigue, weakness.  EYES: No blurred or double vision.  ENT: No tinnitus, ear pain, hearing loss.  RESPIRATORY: Positive for chronic smoker's cough. No wheeze or shortness of breath or chronic obstructive pulmonary disease.  CARDIOVASCULAR: No chest pain, orthopnea or edema.  GASTROINTESTINAL: Positive for nausea, vomiting, no diarrhea. No abdominal pain.  Positive for GERD. GENITOURINARY: No dysuria, hematuria or frequency.  ENDOCRINE: No polyuria, nocturia or thyroid problems.  HEMATOLOGY: No anemia or easy bruising.  SKIN: The patient does have some skin tears in her lower extremities. No rash noted.  MUSCULOSKELETAL: Positive for chronic back pain.  NEUROLOGIC: No CVA or transient ischemic attack.  PSYCHIATRIC: No anxiety or depression.  All other systems reviewed  and negative.   PHYSICAL EXAMINATION: GENERAL: The patient is somewhat lethargic. She answers all questions verbally, appropriately.   VITAL SIGNS: Temperature 101.3, pulse 94, blood pressure 104/51, sats are 96% on 2 liters nasal cannula oxygen. No respiratory distress.  HEENT: Atraumatic, normocephalic. Pupils are equal, round, and reactive to light and accommodation. Extraocular movements intact. Oral mucosa is dry.  NECK:  No neck stiffness, rigidity. No lymphadenopathy.  CARDIOVASCULAR: Both heart  sounds are normal. Rate, rhythm regular. PMI not lateralized. Chest nontender.  EXTREMITIES: Good pedal pulses, good femoral pulses. No lower extremity edema. The patient has some skin tears secondary to injury in both the lower extremities, with pain at present. No significant cellulitis noted. There is some mild erythema noted on the left tibial shin which, per husband, is chronic.  RESPIRATORY: Clear to auscultation, decreased breath sounds in the bases. Some coarse breath sounds heard. No wheezing or use of accessory muscles.  NEUROLOGICAL: The patient is lethargic; however, she moves all her extremities well. She is globally weak but no focal neuro deficit noted. Exam is limited.  SKIN: Warm and dry.   LABORATORY, DIAGNOSTIC AND RADIOLOGIC DATA: CT of the head, changes reflect chronic microvascular ischemic disease. No acute definite intracranial abnormality noted.   CT cervical spine shows multilevel degenerative changes in the cervical spine. No definite acute bony abnormality noted.   Urinalysis negative for urinary tract infection.  Comprehensive metabolic panel within normal limits. CBC within normal limits, except MCV of 105. Troponin is 0.02.   EKG shows normal sinus rhythm with ST-T wave changes, which appears chronic.   ASSESSMENT AND PLAN:  The patient is a 64 year old female with ongoing tobacco abuse and history of acid reflux, comes in with:  1.  Febrile illness, appears viral syndrome.  So far work-up in the Emergency Room including UA, chest x-ray have been negative. She has chronic smoker's cough. She had a fever of 101.3 in the ER. No headache today but had headache yesterday. She also has history of chronic headaches per husband. No neck stiffness. Answered all questions appropriately. No indication for lumbar puncture at present.  This was discussed with ER MD and patient's husband.  We will treat empirically with IV antibiotics. If fever subsides and blood culture negative,  then discontinue antibiotics.  Flu test has been sent out.  Results are pending. This appears to be viral syndrome.  The patient had sick contact with grandson, who had the stomach virus.  We will continue IV fluids. Regular diet as tolerated. Tylenol around the clock. We can ibuprofen for fever.  2.  Clinically dehydrated. We will continue IV fluids.  3.  Nausea, vomiting likely due to #1, p.r.n. Zofran.  4.  Tobacco abuse. Advised cessation. Three minutes spent in counseling.  5.  Chronic obstructive pulmonary disease, stable.  6.  Deep vein thrombosis prophylaxis with subcutaneous heparin.  7. Further work-up according to the patient's clinical course. Hospital admission plan was discussed with the patient and the patient's husband.   TIME SPENT: 50 minutes.     ____________________________ Wylie HailSona A. Allena KatzPatel, MD sap:cc D: 01/26/2013 21:48:07 ET T: 01/26/2013 22:32:37 ET JOB#: [id number]  cc: Sona A. Allena KatzPatel, MD, <Dictator> Demetrios Isaacsonald E. Sherrie MustacheFisher, MD Willow OraSONA A PATEL MD ELECTRONICALLY SIGNED 02/01/2013 14:40

## 2015-03-17 NOTE — Discharge Summary (Signed)
PATIENT NAME:  Cheryl [name], Cheryl [name] MR#:  [id number] DATE OF BIRTH:  04-11-1951  DATE OF ADMISSION:  04/02/2013 DATE OF DISCHARGE:  04/03/2013  DISCHARGE DIAGNOSES: 1.  Altered mental status with metabolic encephalopathy secondary to narcotics. /neurontin 2.  History of chronic back pain.  3.  Dementia.  4.  Hypertension.  5.  Chronic obstructive pulmonary disease.  6.  History of transient ischemic attacks before.   DISCHARGE MEDICATIONS:  Xanax 0.5 mg daily as needed for anxiety, Aricept 5 mg daily, Neurontin 300 mg by mouth 3 times daily, Plavix 75 mg by mouth daily, Spiriva 18 mcg inhalation daily, Celebrex 200 mg by mouth daily, ProAir 90 mcg 2 puffs 4 times daily, Lasix 40 mg by mouth daily.  Tramadol has been decreased.  She was taking 100 mg every 6 hours, just started a week ago, but I decreased to 50 every 8 hours.  advised husband to cut down neurontin if possible as it can also cause sedation  CONSULTATIONS:  Neurology consult, Dr. Malvin JohnsPotter.   DIET:  Low sodium diet.   FOLLOW-UP:  With Dr. Sherrie MustacheFisher in one week.  Follow up with Dr. Laban EmperorNaveira in one week.   HOSPITAL COURSE:  The patient is a 64 year old female patient with history of chronic obstructive pulmonary disease, hypertension, chronic back pain, seen by Dr. Laban EmperorNaveira at pain clinic recently who adjusted the pain medications.  The patient was on tramadol 100 mg 3 times daily, but it is increase to 100 mg every 6 hours and patient came in because of altered mental status.  Look at the history and physical for full details.  The patient was more lethargic when she came and admitted for encephalopathy.  CT head is unremarkable.  Lab data unremarkable.  I stopped the tramadol and obtained neurology consult with Dr. Malvin JohnsPotter.  The patient had an EEG with Dr. Cristopher PeruHemang Shah a week ago, so Dr. Malvin JohnsPotter called me and said the EEG looked completely normal and patient's encephalopathy  could be related to her pain medication. PT also had lumbar puncture which  is negative for infection. The patient right now today is much better, clear mental status, answering questions appropriately.  So I told the husband to try to cut down Toradol to 50 3 times a day and see Dr. Laban EmperorNaveira next week for possible epidural steroid injections or nerve block as Tramadol is giving her confusion.  She was admitted recently for the same problem, seen by Dr. Mordecai MaesSanchez and thought to have a viral syndrome and at that time she was discharged, but this episode happened yesterday and I believe it is probably related to her pain medications.  I explained to the husband to be very careful with the pain medicines as the patient does not have any other neurological deficit today.  She is completely alert and oriented.  Denies any complaints and wants to go home.  The patient will be discharged home and she has a history of heavy smoking, counseled for 3 minutes.  She can continue her Spiriva and ProAir and continue other medications.   Time spent on discharge preparation more than 30 minutes.  Discussed the plan with husband.      ____________________________ Katha HammingSnehalatha Dalisa Forrer, MD sk:ea D: 04/03/2013 10:49:00 ET T: 04/04/2013 06:16:49 ET JOB#: [id number]  cc: Katha HammingSnehalatha Geniece Akers, MD, <Dictator> Francisco A. Laban EmperorNaveira, MD Demetrios Isaacsonald E. Sherrie MustacheFisher, MD Katha HammingSNEHALATHA Gitty Osterlund MD ELECTRONICALLY SIGNED 04/11/2013 17:43

## 2015-03-18 NOTE — Discharge Summary (Signed)
PATIENT NAME:  Cheryl [name], Cheryl [name] MR#:  [id number] DATE OF BIRTH:  1951-06-23  DATE OF ADMISSION:  08/20/2014 DATE OF DISCHARGE:    For a detailed note, please see the history and physical done on admission by Dr. Nemiah CommanderKalisetti.   DIAGNOSES AT DISCHARGE: As follows:  1.  Sepsis secondary to right lower extremity cellulitis. The cellulitis of the right lower extremity.  2.  Metabolic encephalopathy secondary to sepsis.  3.  Chronic obstructive pulmonary disease.  4.  Dementia.  5.  Neuropathy.  6.  History of peripheral vascular disease.  7.  Anxiety.  8.  History of migraines.   DIET: The patient is being discharged on a low-sodium, low-fat diet.   ACTIVITY: As tolerated.   FOLLOWUP: Dr. Mila Merryonald Fisher in the next 1 to 2 weeks.   DISPOSITION: The patient is being discharged to a skilled nursing facility presently.   DISCHARGE MEDICATIONS: Xanax 0.5 mg as needed, sumatriptan 25 mg as needed, mupirocin topical 2% cream applied t.i.d. 500 mg as needed, estropipate 1.5 mg 1 tablet daily, Plavix 75 mg daily, albuterol inhaler 2 puffs 4 times daily as needed, gabapentin 800 mg 4 times daily, Protonix 40 mg b.i.d., Celebrex 200 mg daily, Aricept 10 mg daily, Lyrica 75 mg b.i.d., simvastatin 10 mg at bedtime, tramadol 50 mg 2 tabs 4 times daily as needed, Lasix 40 mg daily, Augmentin 875/125 one tab b.i.d. x 7 days.   PERTINENT STUDIES DONE DURING THE HOSPITAL COURSE: Are as follows: A CT scan of the abdomen and pelvis done with contrast on admission showing no acute intra-abdominal findings, mild diverticulosis, small hiatal hernia. A chest x-ray done on admission showing no acute cardiopulmonary disease.   An ultrasound of the bilateral lower extremities showing no evidence of any acute DVT.    The patient's blood cultures and urine cultures noted to be essentially negative.   HOSPITAL COURSE: This is a 64 year old female with medical problems as mentioned above, presented to the hospital on  08/20/2014 due to fever and suspected sepsis.  1.  Sepsis. The patient presented with fever, tachycardia, altered mental status, and also leukocytosis shortly after she received the flu shot. Upon admission, the source of her fever and sepsis was unclear. Her chest x-ray was negative. Her urinalysis was negative. She did seem to have a cellulitic rash on her right lower extremity which was likely the source of her sepsis. The patient was started on broad-spectrum IV antibiotics with IV vancomycin and Zosyn and maintained on it. Over the next few days, the patient's fever has resolved. Her white cell count has normalized. Her rash on the right lower extremity looks less red and less painful. Since she has clinically improved, she is being discharged on oral Augmentin for treatment for underlying cellulitis. Her blood cultures have remained negative.  2.  Altered mental status. This is likely metabolic encephalopathy due to her sepsis has. After being treated with IV antibiotics and getting IV fluids her mental status is now back to baseline.  3.  Right lower extremity cellulitis. This was likely the source of her sepsis. Initially, the patient was treated with broad-spectrum IV antibiotics with vancomycin, Zosyn, and currently being discharged on oral Augmentin as stated. She is currently afebrile and hemodynamically stable. Blood cultures are negative.  4.  Chronic back pain. The patient was maintained on her tramadol. She will resume that.   5.  Dementia. The patient was maintained on Aricept. She will resume that.  6.  Neuropathy. The  patient was maintained on Neurontin and Lyrica. She will resume that.  7.  History of multiple falls at home. The patient was seen by physical therapy. They thought she would benefit from short-term rehabilitation, which is where she is presently being discharged.  8.  Hyperlipidemia. The patient was maintained on simvastatin and she will resume that.  9.  History of  peripheral vascular disease. The patient was maintained on her Plavix and statin. She will also resume that.   CODE STATUS: The patient is a full code.   DISPOSITION: She is being discharged to a skilled nursing facility.   TIME SPENT ON DISCHARGE: 40 minutes.   ____________________________ Rolly Pancake. Cherlynn Kaiser, MD vjs:at D: 08/24/2014 09:24:26 ET T: 08/24/2014 09:39:52 ET JOB#: [id number]  cc: Rolly Pancake. Cherlynn Kaiser, MD, <Dictator> Houston Siren MD ELECTRONICALLY SIGNED 09/06/2014 14:59

## 2015-03-18 NOTE — H&P (Signed)
PATIENT NAME:  Cheryl [name], Cheryl [name] DATE OF BIRTH:  03/06/1951  DATE OF ADMISSION:  08/20/2014  ADMITTING PHYSICIAN: Enid Baasadhika Amiere Cawley, MD  PRIMARY CARE PHYSICIAN: Demetrios Isaacsonald E. Sherrie MustacheFisher, MD   CHIEF COMPLAINT: Fevers and chills.   HISTORY OF PRESENT ILLNESS: [name] is a 64 year old Caucasian female with a past medical history significant for peripheral neuropathy, chronic back pain, back surgeries, arthritis, early dementia, arrhythmia, and asthma, who presents from home complaining of fever and chills that started for 2 days now. The patient is asleep after her pain medicine just now. Most of the history is obtained from her husband at the bedside. According to him, her symptoms started after she got a flu shot about 3 days ago. According to him, she got a double dose of the flu shot because of her age and risk factors. After coming home, she experienced body aches, just not feeling good. Yesterday she was having low-grade fevers, rapid chills. She presented to the ER. Blood work looked fine. Temperature was normal here, so she was discharged. However, she went home and started having intractable nausea, vomiting, abdominal pain, chills again,  fever of 102 degree Fahrenheit, and was brought back to the ER again. She is slightly tachycardic and has an elevated white count today, and is being admitted for a fever of unknown origin and sepsis.  PAST MEDICAL HISTORY:  1.  Peripheral neuropathy.  2.  Chronic back pain.  3.  Bowel incontinence.  4.  Arthritis.  5.  Early dementia. 6.  Peripheral vascular disease with stents in her lower abdominal aortic branches.   7.  Arrhythmia.  8.  Asthma.   PAST SURGICAL HISTORY:  1.  Cervical fusion surgery in the neck.  2.  Cholecystectomy.  3.  Hysterectomy.   ALLERGIES TO MEDICATIONS: ASPIRIN, CODEINE, MORPHINE, PERCOCET, SULFA DRUGS.   CURRENT HOME MEDICATIONS:  1.  Gabapentin 800 mg p.o. 4 times a day.  2.  Tramadol 100 mg p.o. 4 times a  day.  3.  Protonix 40 mg p.o. b.i.d.  4.  Celebrex 200 mg p.o. daily.  5.  Donepezil 10 mg p.o. daily.  6.  Plavix 75 mg p.o. daily.  7.  Furosemide 40 mg 2 tablets daily.  8.  Xanax 0.5 mg p.r.n. for anxiety.  9.  Sumatriptan 25 mg as needed.  10.  Mupirocin 2% 3 times a day.  11.  Tylenol 500 mg p.r.n. for pain.  12.  Spiriva HandiHaler 1 inhalation daily.  13.  Ventolin inhaler as needed.  14.  Estropipate 1.5 mg p.o. daily.  15.  Lyrica 75 mg p.o. b.i.d.  16.  Simvastatin 10 mg p.o. at bedtime.   SOCIAL HISTORY: Lives at home with her husband. Uses a cane and walker. Has had multiple falls. Smokes about 1 pack per day. No alcohol use.   FAMILY HISTORY: Dad died from lung cancer. Mom had melanoma, but died from diabetic complications.   REVIEW OF SYSTEMS:  CONSTITUTIONAL: Positive for fever, fatigue. No weight loss or weight gain.  EYES: No blurred vision, double vision, inflammation or glaucoma.  ENT: No tinnitus, ear pain, epistaxis or discharge.  RESPIRATORY: No cough, wheeze, hemoptysis or chronic obstructive pulmonary disease. Positive for asthma,  CARDIOVASCULAR: No chest pain, orthopnea, edema, arrhythmia, palpitations, or syncope.  GASTROINTESTINAL: Positive for nausea and vomiting lower abdominal pain. No diarrhea. No hematemesis or melena. Possible for bowel incontinent.  GENITOURINARY: No dysuria, hematochezia, renal calculus, frequency, or incontinence.  ENDOCRINE: No  polyuria, nocturia, thyroid problems, heat or cold intolerance.  HEMATOLOGY: No anemia, easy bruising or bleeding.  SKIN: Mild redness in the right lower extremity, but no rash or lesions.  MUSCULOSKELETAL: Positive for back pain and multiple joint pains from arthritis.  NEUROLOGICAL: No numbness, weakness, CVA, TIA or seizures.  PSYCHOLOGIC: No anxiety, insomnia or depression.   PHYSICAL EXAMINATION:  VITAL SIGNS: Temperature 99.6 degrees Fahrenheit, pulse 99, respirations 20, blood pressure 100/54,  pulse oximetry 96% on room air.  GENERAL: Well-built, well-nourished female lying in bed, not in any acute distress.  HEENT: Normocephalic, atraumatic. Pupils equal, round and reacting to light. Anicteric sclerae. Extraocular movements intact. Oropharynx clear without erythema, mass or exudates.  NECK: Supple. No thyromegaly, JVD or carotid bruits. No lymphadenopathy.  LUNGS: Moving air bilaterally. No wheeze or crackles. No use of accessory muscles of breathing.  CARDIOVASCULAR: S1, S2, regular rate and rhythm. No murmurs, rubs, or gallops.  ABDOMEN: Soft, nontender, nondistended. No hepatosplenomegaly. Normal bowel sounds.  EXTREMITIES: Right lower extremity is more swollen than the left  and is erythematous up into the mid leg. No tenderness noted. Multiple healing abrasions from previous fall noted. No clubbing or cyanosis. There are 2+ dorsalis pedis pulses palpable bilaterally.  SKIN: Other than the erythema on the leg, no other lesions noted.  NEUROLOGIC: Cranial nerves intact. No focal motor or sensory deficits.  PSYCHOLOGICAL: The patient was arousable. She is alert and oriented.   LABORATORY DATA: WBC 15.5, hemoglobin 11.1, hematocrit 34.7, platelet count 205,000.  Sodium 135, potassium 3.3, chloride 102, bicarbonate 24, BUN 16, creatinine 0.8, glucose 68, and calcium 8.0.   ALT 20, AST 21, alkaline phosphatase 111, total bilirubin 0.4, albumin 2.8. Islet antigen tests are negative.   CT of the abdomen and pelvis showing no acute findings, mild diverticulosis, small right pleural effusion and hiatal hernia noted.   Urinalysis negative for any infection.   Chest x-ray showing no acute cardiopulmonary disease; clear lung fields noted.   ASSESSMENT AND PLAN: A 64 year old female with past medical history significant for back pain, neuropathy, arthritis, dementia, and asthma, admitted for early sepsis and fevers.  1.  Early sepsis with ongoing fever, started after a flu shot, so could  be a reactive versus viral. We will follow up cultures. Her right leg is swollen. According to family, she has chronic swelling on and off after multiple falls. This could be early cellulitis and could be the cause of her sepsis. We will order Dopplers to rule out DVT. Blood cultures have been ordered, and vancomycin and Zosyn have been started empirically.   2.  Chronic back pain. Continue her pain medications.  3.  Dementia. She is on Aricept.  4.  Neuropathy. Continue home medications.  5.  Peripheral vascular disease on Plavix, status post stents put in in the past.  6.  Multiple falls at home. Physical therapy consulted.  7.  Deep vein thrombosis prophylaxis.   CODE STATUS: Full Code.   TIME SPENT ON ADMISSION: 50 minutes    ____________________________ Enid Baas, MD rk:MT D: 08/20/2014 13:47:58 ET T: 08/20/2014 14:34:01 ET JOB#: [id number]  cc: Enid Baas, MD, <Dictator> Demetrios Isaacs. Sherrie Mustache, MD Enid Baas MD ELECTRONICALLY SIGNED 08/25/2014 9:51

## 2015-03-19 NOTE — H&P (Signed)
PATIENT NAME:  [name], Cheryl [name] DATE OF BIRTH:  03/25/1951  DATE OF ADMISSION:  03/11Dimas Millin/2013  ADDENDUM:   IMPRESSION AND PLAN: Tobacco abuse. The patient was counseled for about three minutes. She is trying to quit but denied any need of nicotine replacement therapy while in the hospital.   ____________________________ Lincon Sahlin S. Sherryll BurgerShah, MD vss:drc D: 02/03/2012 21:58:20 ET T: 02/04/2012 09:01:40 ET JOB#: [id number]  cc: Page Lancon S. Sherryll BurgerShah, MD, <Dictator> Demetrios Isaacsonald E. Sherrie MustacheFisher, MD Patricia PesaVIPUL S Malashia Kamaka MD ELECTRONICALLY SIGNED 02/04/2012 15:18

## 2015-03-19 NOTE — H&P (Signed)
PATIENT NAME:  Cheryl [name], HILGEMAN MR#:  [id number] DATE OF BIRTH:  30-Dec-1950  DATE OF ADMISSION:  11/11/2011  CHIEF COMPLAINT: Trouble breathing.   HISTORY OF PRESENT ILLNESS: The patient is a 64 year old female with history of neuropathy who came in because of trouble breathing, cough, body pains Saturday, that is two days ago, and the patient thought she might have flu and because of cough, fever and trouble breathing, the patient's son brought her here. She had grandson who was sick with cold at home. Because of cough and fever, she was almost in the bed for the last two days with severe body pains and back pain and generalized weakness. The patient did take flu shot this year.   PAST MEDICAL HISTORY:   1. History of neuropathy. 2. History of deep venous thrombosis.  3. Gastroesophageal reflux disease.   PAST SURGICAL HISTORY:    1. Hysterectomy.  2. Cholecystectomy.   SOCIAL HISTORY: She lives with her son and she is still smoking 1 pack per day. She was smoking about 2 packs before, but did cut down to 1 pack and does not want to quit. Occasional alcohol use. No drugs.   ALLERGIES: Aspirin, codeine, morphine, Percocet, and sulfa, but she can take tramadol and Neurontin.   MEDICATIONS:  1. Tramadol 50 mg q.6 hours. 2. Neurontin 600 mg q.6 hours.   FAMILY HISTORY: No history of hypertension or diabetes.   REVIEW OF SYSTEMS: CONSTITUTIONAL: Only has fever and fatigue. EYES: No blurred vision. ENT: No tinnitus. No ear pain. No hearing loss. No epistaxis. No difficulty swallowing. The patient has cough and also trouble breathing. CARDIOVASCULAR: Chest pain when she coughs. GASTROINTESTINAL: Has no vomiting. No abdominal pain. No hematemesis. GENITOURINARY: No dysuria. ENDOCRINE: No polyuria. No nocturia. INTEGUMENTARY: No skin rashes. MUSCULOSKELETAL: Complains of joint pains and body pains. NEUROLOGIC: No numbness. No weakness. She has a history of neuropathy. PSYCH: No anxiety or insomnia.    PHYSICAL EXAMINATION:  VITAL SIGNS: 64 year old female who is in distress because of the pain and slightly dehydrated, answering questions appropriately.   HEENT: Head atraumatic, normocephalic. Pupils are equally reacting to light. Extraocular movements are intact.   ENT: No tympanic membrane congestion. No turbinate hypertrophy. No oropharyngeal erythema.   NECK: Normal range of motion. No JVD. No carotid bruit.   CARDIOVASCULAR: S1 and S2 regular. No murmurs.   LUNGS: The patient has right-sided wheeze present.   ABDOMEN: Soft, nontender, nondistended. Bowel sounds present.   EXTREMITIES: No extremity edema. No cyanosis. No clubbing.   MUSCULOSKELETAL: Power five out of five in upper and lower extremities.   SKIN: No skin rashes.   NEUROLOGICAL: Cranial nerves II through XII are intact. Power is five out of five in upper and lower extremities.   LABORATORY, DIAGNOSTIC, AND RADIOLOGICAL DATA: Chest x-ray showed increased density in the cardiac silhouette region consistent with infiltrate in the right middle lobe. CK total 78, CPK-MB 1, BNP 284. WBC 5.8, hemoglobin 12, hematocrit 35.9, platelets 136. Electrolytes: Sodium 135, potassium 3.5, chloride 100, bicarbonate 27, BUN 29, creatinine 0.76, glucose 78. The patient's troponin less than 0.02. Influenza titers were negative. EKG showed normal sinus rhythm with ST depression in V4, V5, V6 and rate is 85 beats per minute.   ASSESSMENT AND PLAN:  1. The patient is a 64 year old female with pneumonia on the right middle lobe. The patient is admitted to hospitalist service on off-unit tele. The patient is to get IV Rocephin and Zithromax along with oxygen as  needed.  2. Mild dehydration. Continue IV fluids with normal 100 milliliters. The patient does not want to eat anything today, so will continue IV fluids and start the diet when she wants to eat.  3. ST depression in lateral leads, likely secondary to her pneumonia. We will get an  echocardiogram and trend the troponins.  4. Neuropathy and chronic back pain. She is on tramadol and Neurontin. Continue that.   TOTAL TIME SPENT: 60 minutes.   ____________________________ Katha HammingSnehalatha Michelena Culmer, MD sk:ap D: 11/11/2011 16:57:11 ET T: 11/11/2011 17:26:54 ET JOB#: [id number]  cc: Katha HammingSnehalatha Annelyse Rey, MD, <Dictator> Katha HammingSNEHALATHA Sallie Staron MD ELECTRONICALLY SIGNED 12/01/2011 21:55

## 2015-03-19 NOTE — Discharge Summary (Signed)
PATIENT NAME:  Cheryl [name], Cheryl [name] MR#:  [id number] DATE OF BIRTH:  27-Feb-1951  DATE OF ADMISSION:  02/03/2012 DATE OF DISCHARGE:  02/05/2012  PRIMARY CARE PHYSICIAN: Dr. Julieanne Manson  PULMONOLOGIST: Dr. Kendrick Fries at [facility]   DISCHARGE DIAGNOSES:  1. Chronic obstructive pulmonary disease exacerbation, improving on steroids and antibiotics. 2. Acute bronchitis, no pneumonia, improving on above regimen.   SECONDARY DIAGNOSES:  1. Chronic obstructive pulmonary disease. 2. History of deep venous thrombosis status post stenting. 3. Gastroesophageal reflux disease.  4. Anxiety. 5. Neuropathy.   CONSULTATION: Physical therapy.   LABORATORY, DIAGNOSTIC AND RADIOLOGICAL DATA: CT scan of the chest for PE protocol on 03/11 showed no PE. Emphysematous changes in both lung with bullous lesions on the right. Apical pleural scarring bilaterally. No mediastinal or hilar lymphadenopathy. No evidence of congestive heart failure or acute thoracic aortic pathology. Chest x-ray on 03/11 showed chronic obstructive pulmonary disease. No evidence of pneumonia. Chest x-ray on 03/12 showed clear lung fields and chronic obstructive pulmonary disease.   Major laboratory panel: Urinalysis on admission was negative. Blood cultures x2 were negative. Urine culture remained negative. Influenza A and B were negative.   HISTORY AND SHORT HOSPITAL COURSE: Patient is a 64 year old female with above-mentioned medical problems was admitted for acute on chronic respiratory failure likely due to chronic obstructive pulmonary disease exacerbation. Was started on steroids, antibiotic, nebulizer breathing treatment, Flovent and Spiriva. She was slowly improving on above regimen. She was also started on anti-anxiety medication as this was thought to be contributing to her symptoms. She was evaluated by physical therapy and did not find any skilled needs. On 03/13 she was doing much better, close to her baseline. Initially family  was upset requesting specific bacterial infection name but after long discussion they realized the nature of her illness. They requested generic medication which was also considered in the discharge planning. She would not qualify for oxygen and this was indicated to family. Patient was also instructed and counseled for smoking cessation again and she is trying to cut back. Denied any need of nicotine replacement on discharge. On 03/13 she was close to her baseline and was discharged home in stable condition.   PHYSICAL EXAMINATION: VITAL SIGNS: On the date of discharge her vital signs are as follows: Temperature 97.9, heart rate 82 per minute, respirations 18 per minute, blood pressure 119/71 mmHg. She was saturating 98% on room air. Pertinent Physical Examination: CARDIOVASCULAR: S1, S2 normal. No murmur, rubs, or gallop. LUNGS: Clear to auscultation bilaterally. No wheezing, rales, rhonchi, crepitation. ABDOMEN: Soft, benign. NEUROLOGIC: Nonfocal examination. All other physical examination remained at the baseline.   DISCHARGE MEDICATIONS:  1. Vitamin D3 1000 international units once daily.  2. Combivent 2 puffs inhaled 4 times a day as needed. 3. Gabapentin 300 mg 2 capsules p.o. 4 times a day.  4. Tramadol 50 mg p.o. 3 times a day.  5. Estropipate 1.5 mg p.o. daily.  6. Omeprazole 20 mg p.o. daily.  7. Alprazolam 0.25 mg p.o. daily as needed.  8. Prednisone 60 mg p.o. daily, taper 10 mg daily until finished.  9. Doxycycline 100 mg p.o. b.i.d. for five more days.   DISCHARGE DIET: Low sodium.   DISCHARGE ACTIVITY: As tolerated.   DISCHARGE INSTRUCTIONS AND FOLLOW UP: Patient was instructed to follow up with her primary care physician, Dr. Julieanne Manson, in 1 to 2 weeks. She was instructed to follow up with Dr. Kendrick Fries from [facility] in 2 to 3 weeks.  TOTAL TIME DISCHARGING THIS PATIENT: 55 minutes.   ____________________________ Ellamae SiaVipul S. Sherryll BurgerShah,  MD vss:cms D: 02/08/2012 17:13:45 ET T: 02/10/2012 11:10:04 ET JOB#: [id number]  cc: Kimila Papaleo S. Sherryll BurgerShah, MD, <Dictator> Richard L. Sullivan LoneGilbert, MD Lupita Leashouglas B. McQuaid, MD Ellamae SiaVIPUL S [facility] MD ELECTRONICALLY SIGNED 02/10/2012 16:42

## 2015-03-19 NOTE — H&P (Signed)
PATIENT NAME:  Cheryl [name], SOBH MR#:  [id number] DATE OF BIRTH:  [birth year]  DATE OF ADMISSION:  02/03/2012  PRIMARY CARE PHYSICIAN: Mila Merry, MD   REQUESTING PHYSICIAN: Dr. Clemens Catholic   CHIEF COMPLAINT: Shortness of breath.   HISTORY OF PRESENT ILLNESS: The patient is a 64 year old female with a known history of chronic obstructive pulmonary disease who is being admitted for acute on chronic respiratory failure likely due to COPD exacerbation. The patient is having shortness of breath, night sweats, chills, shivering, weakness, and some chest pain since last Saturday. She normally walks with a cane due to her neuropathy or at times uses a walker but since Saturday she has been very sleepy. She has been having a lot more trouble breathing. She is unable to stand and walk from wheelchair due to significant weakness. Her cough is mainly dry and she has been complaining of her chest hurting with deep coughing. While in the ER, she was still having shivering but there was no documented fever. As per her husband at the bedside, the patient has been having very poor p.o. intake and is unable to be managed at home. She is being admitted for further evaluation and management. While in the ED, she underwent chest x-ray which showed no acute cardiopulmonary disease other than COPD. She also underwent CT of the chest which was negative for PE but did show bullous lesions with emphysematous changes on lungs.   PAST MEDICAL HISTORY:  1. Chronic obstructive pulmonary disease. 2. History of DVT status post two stents done in [place] 2-1/2 years ago after which she was on Effient and now she's been off for at least two years.  3. Gastroesophageal reflux disease.  4. Anxiety.  5. Neuropathy.   ALLERGIES: Aspirin, codeine, morphine, Percocet, and sulfa drugs.   MEDICATIONS AT HOME:  1. Alprazolam 0.25 mg p.o. at bedtime.  2. Combivent 2 puffs inhaled four times a day as needed.  3. Estropipate 1.5 mg p.o.  daily. 4. Gabapentin 600 mg p.o. 4 times a day.  5. Omeprazole 20 mg p.o. daily.  6. Tramadol 50 mg p.o. every six hours as needed.  7. Vitamin D3 1000 international units once daily.   PAST SURGICAL HISTORY:  1. Hysterectomy.  2. Cholecystectomy.   SOCIAL HISTORY: She smokes about 3 packs in a week for the last 50 years, was smoking heavily before, trying to quit. She started with 2 packs followed by 1 pack, and now she is doing 3 packs a week. Occasional alcohol. No IV drugs of abuse. She is disabled.   FAMILY HISTORY: Brother and sister with diabetes.   REVIEW OF SYSTEMS: CONSTITUTIONAL: Positive for chills, shivering, and night sweats. No fever. Positive fatigue and weakness. EYES: No blurred or double vision. ENT: No tinnitus or ear pain. RESPIRATORY: Positive for dry cough and dyspnea. History of COPD. CARDIOVASCULAR: Chest pain on deep breathing and coughing. No orthopnea or edema. Dyspnea on exertion present. GI: No nausea, vomiting, or diarrhea. GU: No dysuria or hematuria. ENDOCRINE: No polyuria or nocturia. Increased sweating and heat and cold intolerance. HEMATOLOGY: No anemia or easy bruising. SKIN: No rash or lesion. MUSCULOSKELETAL: Positive for weakness and difficulty walking with neuropathy. NEUROLOGIC: Generalized weakness and difficulty walking with neuropathy. PSYCHIATRIC: Positive for anxiety. No history of depression.   PHYSICAL EXAMINATION:   VITAL SIGNS: Temperature 97.8, heart rate 65 per minute, respirations 22 per minute, blood pressure 137/70 mmHg. She is saturating 100% on 2 liters oxygen via nasal cannula.  GENERAL: The patient is a 64 year old female laying in the bed in some respiratory distress.   EYES: Pupils equal, round, reactive to light and accommodation. No scleral icterus. Extraocular muscles intact.   HEENT: Head atraumatic, normocephalic. Oropharynx and nasopharynx clear.   NECK: Supple. No jugular venous distention. No thyroid enlargement or  tenderness.  LUNGS: Decreased breath sounds at the bases. Minimal expiratory wheezing bilaterally. No rhonchi or crepitation.   CARDIOVASCULAR: S1, S2 normal. No murmur, rubs, or gallop.   ABDOMEN: Soft, nontender, nondistended. Bowel sounds present. No organomegaly or mass.   EXTREMITIES: No pedal edema, cyanosis, or clubbing.   NEUROLOGIC: Nonfocal examination. Cranial nerves III to XII intact. Muscle strength 5 out of 6. Extremity sensation intact.  PSYCHIATRIC: The patient is oriented to time, place, and person x3.   SKIN: No obvious rash, lesion, or ulcer.   LABORATORY, DIAGNOSTIC, AND RADIOLOGICAL DATA: Normal BMP. Normal first set of cardiac enzymes. Normal TSH. Normal CBC. Negative Influenza test. Negative urinalysis. ABG showed pH of 7.46, pCO2 of 36.   Chest x-ray while in the ED showed COPD. CT scan of the chest showed no PE. Emphysematous changes in both lungs. Bullous lesions on the right. Apical pleural scarring bilaterally. No mediastinal hilar lymphadenopathy. No CHF or acute thoracic aortic pathology.   EKG shows normal sinus rhythm, LVH. No major ST-T changes. Prolonged QT of 512 ms.   IMPRESSION AND PLAN:  1. Acute on chronic respiratory failure likely due to COPD exacerbation. Will start her on Flovent, Spiriva, Solu-Medrol, IV Levaquin, and nebulizer breathing treatment. Obtain sputum culture. Consult Pulmonary.  2. Neuropathy. Will continue gabapentin. Obtain physical therapy consultation. Normally she uses a cane or walker at home for walking.  3. Gastroesophageal reflux disease. Will continue PPI. 4. Anxiety. Will continue Xanax at the home dose.   CODE STATUS: FULL CODE.   TOTAL TIME TAKING CARE OF THIS PATIENT: 55 minutes.   ____________________________ Ellamae SiaVipul S. Sherryll BurgerShah, MD vss:drc D: 02/03/2012 21:57:32 ET T: 02/04/2012 08:45:39 ET JOB#: [id number]  cc: Galilea Quito S. Sherryll BurgerShah, MD, <Dictator> Demetrios Isaacsonald E. Sherrie MustacheFisher, MD Patricia PesaVIPUL S Imari Reen MD ELECTRONICALLY SIGNED 02/04/2012  15:18

## 2015-03-20 DIAGNOSIS — A499 Bacterial infection, unspecified: Secondary | ICD-10-CM | POA: Diagnosis not present

## 2015-03-20 DIAGNOSIS — G629 Polyneuropathy, unspecified: Secondary | ICD-10-CM | POA: Diagnosis not present

## 2015-03-20 DIAGNOSIS — J449 Chronic obstructive pulmonary disease, unspecified: Secondary | ICD-10-CM | POA: Diagnosis not present

## 2015-03-20 DIAGNOSIS — I10 Essential (primary) hypertension: Secondary | ICD-10-CM | POA: Diagnosis not present

## 2015-03-20 DIAGNOSIS — N390 Urinary tract infection, site not specified: Secondary | ICD-10-CM | POA: Diagnosis not present

## 2015-03-20 DIAGNOSIS — G8929 Other chronic pain: Secondary | ICD-10-CM | POA: Diagnosis not present

## 2015-03-20 DIAGNOSIS — F17210 Nicotine dependence, cigarettes, uncomplicated: Secondary | ICD-10-CM | POA: Diagnosis not present

## 2015-03-21 DIAGNOSIS — F17210 Nicotine dependence, cigarettes, uncomplicated: Secondary | ICD-10-CM | POA: Diagnosis not present

## 2015-03-21 DIAGNOSIS — A499 Bacterial infection, unspecified: Secondary | ICD-10-CM | POA: Diagnosis not present

## 2015-03-21 DIAGNOSIS — G8929 Other chronic pain: Secondary | ICD-10-CM | POA: Diagnosis not present

## 2015-03-21 DIAGNOSIS — G629 Polyneuropathy, unspecified: Secondary | ICD-10-CM | POA: Diagnosis not present

## 2015-03-21 DIAGNOSIS — I10 Essential (primary) hypertension: Secondary | ICD-10-CM | POA: Diagnosis not present

## 2015-03-21 DIAGNOSIS — J449 Chronic obstructive pulmonary disease, unspecified: Secondary | ICD-10-CM | POA: Diagnosis not present

## 2015-03-21 DIAGNOSIS — N390 Urinary tract infection, site not specified: Secondary | ICD-10-CM | POA: Diagnosis not present

## 2015-03-24 DIAGNOSIS — N390 Urinary tract infection, site not specified: Secondary | ICD-10-CM | POA: Diagnosis not present

## 2015-03-24 DIAGNOSIS — A499 Bacterial infection, unspecified: Secondary | ICD-10-CM | POA: Diagnosis not present

## 2015-03-24 DIAGNOSIS — F17210 Nicotine dependence, cigarettes, uncomplicated: Secondary | ICD-10-CM | POA: Diagnosis not present

## 2015-03-24 DIAGNOSIS — I10 Essential (primary) hypertension: Secondary | ICD-10-CM | POA: Diagnosis not present

## 2015-03-24 DIAGNOSIS — G629 Polyneuropathy, unspecified: Secondary | ICD-10-CM | POA: Diagnosis not present

## 2015-03-24 DIAGNOSIS — R29898 Other symptoms and signs involving the musculoskeletal system: Secondary | ICD-10-CM | POA: Diagnosis not present

## 2015-03-24 DIAGNOSIS — J449 Chronic obstructive pulmonary disease, unspecified: Secondary | ICD-10-CM | POA: Diagnosis not present

## 2015-03-24 DIAGNOSIS — I2510 Atherosclerotic heart disease of native coronary artery without angina pectoris: Secondary | ICD-10-CM | POA: Diagnosis not present

## 2015-03-24 DIAGNOSIS — G8929 Other chronic pain: Secondary | ICD-10-CM | POA: Diagnosis not present

## 2015-03-26 NOTE — H&P (Signed)
PATIENT NAME:  Cheryl MillinROBEY, Lianni G MR#:  [id number] DATE OF BIRTH:  08/26/1951  DATE OF ADMISSION:  02/03/2015    PRIMARY CARE PHYSICIAN: Demetrios Isaacsonald E. Sherrie MustacheFisher, MD   CHIEF COMPLAINT: Altered mental status.   HISTORY OF PRESENT ILLNESS: A 64 year old Caucasian female with history of chronic pain, peripheral neuropathy, presenting with altered mental status. She is unable to provide meaningful information given mental status and medical condition. History obtained from her husband who is present at bedside. He states that the patient was in her usual state of health approximately 4 days ago; however, 3 days ago she developed some nausea and generalized malaise. No further  symptomatology at that time. Subsequently, became weak and fatigued, and now has 1 day duration of lethargy with difficulty to arouse, thus prompting him to present to the hospital for further workup and evaluation. States had a similar presentation approximately once yearly, never found etiology and most recent episode September 2015, no etiology found.   REVIEW OF SYSTEMS: Unobtainable due to the patient's mental status and medical condition.   PAST MEDICAL HISTORY: Includes hyperlipidemia, unspecified; hypertension, essential; COPD; chronic pain; peripheral neuropathy; gastroesophageal reflux disease without esophagitis.   SOCIAL HISTORY: Uses a walker for ambulation. Everyday tobacco use. No alcohol use.   FAMILY HISTORY: Positive for diabetes.   ALLERGIES: ASPIRIN, CODEINE, MORPHINE, PERCOCET, SULFA DRUGS.   HOME MEDICATIONS: Include Celebrex 200 mg p.o. daily; fentanyl patch 12 mg change every 72 hours, which was started February 1; sumatriptan 25 mg as needed; tramadol 50 mg 2 tablets p.o. 4 times a day as needed; Tylenol 500 mg 2 tablets p.o. q. 6 hours as needed; Lyrica 75 mg 2 capsules p.o. b.i.d.; simvastatin 10 mg p.o. at bedtime; Plavix 75 mg p.o. daily; alprazolam 0.5 mg p.o. at bedtime as needed; Advair 250/50 mcg inhalation  1 puff b.i.d.; Spiriva 18 mcg inhalation daily; Ventolin 90 mcg inhalation 2 puffs 4 times a day as needed; donepezil 10 mg p.o. daily; mupirocin 2% topical to affected area 3 times daily; Lasix 40 mg p.o. daily; lansoprazole 30 mg p.o. daily; estropipate 0.75 mg p.o. daily.   PHYSICAL EXAMINATION:  VITAL SIGNS: Temperature 97.8, heart rate of 58, respirations 18, blood pressure 135/66, saturating 98% on room air. Weight 47.2 kg, BMI 19.  GENERAL: Chronically ill-appearing Caucasian female currently in moderate distress given mental status.  HEAD: Normocephalic, atraumatic.  EYES: Pupils equal, round sluggishly reactive to light. Extraocular muscles unable to fully assess given mental status and medical condition. No scleral icterus.  MOUTH: Moist mucous membranes. Dentition intact. No abscess noted.  EARS, NOSE, AND THROAT: Clear without exudates. No external lesions.   NECK: Supple. No thyromegaly. No nodules. No JVD.  PULMONARY: Clear to auscultation bilaterally without wheezes, rubs, or rhonchi. No use of accessory muscles. Good respiratory effort.  CHEST: Nontender to palpation.  CARDIOVASCULAR: S1, S2, regular rate and rhythm. No murmurs, rubs, or gallops. No edema. Pedal pulses 2+ bilaterally.  GASTROINTESTINAL: Soft, nontender, nondistended. No masses. Positive bowel sounds. No hepatosplenomegaly.  MUSCULOSKELETAL: No swelling, clubbing, or edema. Passive range of motion full in all extremities.  NEUROLOGIC: Unable to fully assess given patient's mental status and medical condition. She does respond by withdrawing to painful stimuli; however, does not respond to verbal stimuli. She is unable to follow simple at this time.  SKIN: No ulcerations, lesions, rashes, cyanosis. Skin warm, dry. Turgor intact.  PSYCHIATRIC: Unable to fully assess given patient's mental status and medical condition.   LABORATORY DATA: Sodium  142, potassium 3.9, chloride 108, bicarbonate 25, BUN 16, creatinine 0.65,  glucose 102. LFTs within normal limits. Troponin less than 0.03. WBC is 4.6, hemoglobin 11, platelets of 280,000.  Urinalysis: WBCs 3, RBCs 2, leukocyte esterase trace, nitrite positive, epithelial cells less than 1. ABG performed: PH of 7.45, CO2 of 37, O2 of 70, and bicarbonate of 25.7. CT head performed, which reveals no intracranial process. Chest x-ray performed reveals no acute cardiopulmonary process.   ASSESSMENT AND PLAN: A 64 year old Caucasian female with history of chronic pain disorder as well as peripheral neuropathy, presenting with altered mental status.  1.  Encephalopathy, unclear etiology at this time. Perform neurological checks q. 4 hours. We will minimize further sedating agents.  2.  Urinary tract infection, unspecified location, ceftriaxone for antibiotic coverage already initiated in the Emergency Department.  3.  Chronic pain disorder. Continue with fentanyl and Lyrica. Once again, minimize sedating agents.  4.  Gastroesophageal reflux disease without esophagitis. Proton pump inhibitor therapy.  5.  Chronic obstructive pulmonary disease, not in acute exacerbation. Continue with Advair and Spiriva.   6.  Venous thromboembolism prophylaxis with heparin subcutaneous.   CODE STATUS: The patient is a full code.   TIME SPENT: 45 minutes.    ____________________________ Cletis Athens. Tyreik Delahoussaye, MD dkh:bm D: 02/03/2015 02:29:53 ET T: 02/03/2015 03:11:01 ET JOB#: [id number]  cc: Cletis Athens. Sahalie Beth, MD, <Dictator> Young Mulvey Synetta Shadow MD ELECTRONICALLY SIGNED 02/03/2015 20:43

## 2015-03-26 NOTE — Discharge Summary (Signed)
PATIENT NAME:  Cheryl [name], Cheryl [name] MR#:  [id number] DATE OF BIRTH:  [birth year]  DATE OF ADMISSION:  02/03/2015 DATE OF DISCHARGE:  02/06/2015   PRIMARY CARE PHYSICIAN:  Demetrios Isaacsonald E. Sherrie MustacheFisher, MD  DISCHARGE DIAGNOSES: 1.  Acute encephalopathy, possibly due to urinary tract infection.  2.  Hypertension. 3.  Chronic obstructive pulmonary disease. 4.  Chronic pain disorder. 5.  Gastroesophageal reflux disease. 6.  Tobacco abuse.   CONDITION: Stable.   CODE STATUS: Full code.   HOME MEDICATIONS: Please refer to the medication reconciliation list.   DIET: Low-sodium diet.   ACTIVITY: As tolerated.  FOLLOWUP CARE: With PCP within 1- 2 weeks. The patient needs physical therapy and RN.   REASON FOR ADMISSION: Altered mental status.   HOSPITAL COURSE: The patient is a 64 year old Caucasian female with a history of chronic pain and peripheral neuropathy, was sent to ED due to decreased mental status, nausea, no vomiting. For detailed history and physical examination, please refer to the admission note dictated by Dr. Clint GuyHower.   On admission date, the patient's urinalysis showed UTI.  CAT scan of head did not show any intracranial process. Chest x-ray reveals no acute cardiopulmonary process.  1.  The patient was admitted for UTI with acute encephalopathy. After admission, the patient has been treated with Rocephin.  Blood culture is negative. The patient's mental status has been improving. The patient is alert, awake, and oriented now. She has no complaints except mild lower abdominal pain, but she denies any dysuria or hematuria, or incontinence. The patient underwent physical therapy and physical therapy had suggested the patient needs home physical therapy and home health with RN.  2.  For tobacco abuse, the patient was counseled for smoking cessation.  3.  COPD is stable.   The patient is clinically stable. She will be discharged to home with home health and PT today. I discussed the patient's  discharge plan with the patient, nurse, and case manager.   TIME SPENT: About 37 minutes.    ____________________________ Shaune PollackQing Annlouise Gerety, MD qc:LT D: 02/06/2015 17:00:39 ET T: 02/06/2015 17:21:44 ET JOB#: [id number]  cc: Shaune PollackQing Kent Braunschweig, MD, <Dictator> Shaune PollackQING Macguire Holsinger MD ELECTRONICALLY SIGNED 02/06/2015 18:02

## 2015-03-29 ENCOUNTER — Other Ambulatory Visit: Payer: Self-pay

## 2015-03-29 DIAGNOSIS — I10 Essential (primary) hypertension: Secondary | ICD-10-CM

## 2015-03-29 NOTE — Patient Outreach (Signed)
[facility] Care Management  03/29/2015  Dimas MillinMary G Guerin 23-Jun-1951 [id number]   RN CM [facility] with patient to [facility].  Explained that [facility]/ disease [facility].  Patient is agreeable to services and wishes to schedule an appointment. RN CM will make a referral to community health coach for disease management/ education.  Wynonia Lawmanheryl Paola Aleshire, RN, Lowella DellMHA, [facility] Telephonic Care Coordinator [phone]

## 2015-03-30 ENCOUNTER — Other Ambulatory Visit: Payer: Self-pay

## 2015-03-30 NOTE — Patient Outreach (Signed)
[facility]N) Care Management  03/30/2015  Cheryl MillinMary G [name] [birth year] [id number]   Subjective:  [facility]. Patient states she is doing "Alright".  Patient shares she is recovering from a fall back in March that left her with some fracutred ribs.  Patient reports using a walker for ambulation at all times as she has some balance issues. Patient shares that her pain level is a 5/10 right now and is better right now than earlier this morning.  Patient shares that she has other pain issues for which she sees Dr. Laban EmperorNaveira.  Patient reports that her pain control is good with current regimen.  Patient also see Gastroenterology Dr. Bluford Kaufmannh and   Neurology Dr. Sherryll BurgerShah.  Patient reports problems with her COPD recently where she has some periods of shortness of breath and uses her oxygen as needed.  Patient also reports she does rest when she is short of breath.  Patient not sure of oxygen liters as she states it is already set for her when turns it on and does not pay attention to liters.  Patient reports that her husband sets up her medication for her but she is able to take them.  Patient also reports she wears pull ups as she has some problems with bladder leakage and some bowel incontinence at times.  Patient reports she goes out several times a week with family help.    Assessment: [facility] to assist patient in maintaining health status.    Plan: Health Coach will send patient welcome packet with consents and COPD information.  Health coach will call patient within the next week to complete assessment and provide support and education.    Bary Lericheionne J Brooklyn Jeff, RN, MSN [facility] Care [facility] [phone]

## 2015-04-03 ENCOUNTER — Other Ambulatory Visit: Payer: Self-pay

## 2015-04-03 NOTE — Patient Instructions (Signed)

## 2015-04-03 NOTE — Patient Outreach (Signed)
[facility]) Care Management  04/03/2015  Cheryl [name] 14-Feb-1951 [id number]   Received notification from Fleeta Emmerionne Leath, RN [facility].  Corrie MckusickLisa O. [facility] CM Assistant Phone: [phone] Fax: [phone]

## 2015-04-03 NOTE — Patient Outreach (Signed)
[facility]) Care Management  [facility]  04/03/2015   Cheryl MillinMary G [name] 07/17/1951 [id number]  Subjective:  Spoke with patient this morning to complete initial assessment.  Patient reports she is doing good this morning.  No complaints.  Patient able to review medications with nurse via her medication list.  Patient able to tell nurse uses for her medication.  Patient is agreeable to ongoing health coach calls.    Current Medications:  Current Outpatient Prescriptions  Medication Sig Dispense Refill  . acetaminophen (TYLENOL) 500 MG tablet Take 500 mg by mouth every 6 (six) hours as needed (2 tablets).    Marland Kitchen. albuterol (PROVENTIL HFA;VENTOLIN HFA) 108 (90 BASE) MCG/ACT inhaler Inhale 2 puffs into the lungs every 4 (four) hours as needed for wheezing or shortness of breath.    . ALPRAZolam (XANAX) 0.5 MG tablet Take 0.5 mg by mouth at bedtime as needed.    . celecoxib (CELEBREX) 200 MG capsule Take 200 mg by mouth 2 (two) times daily.    . clopidogrel (PLAVIX) 75 MG tablet Take 75 mg by mouth daily.    Marland Kitchen. donepezil (ARICEPT) 10 MG tablet Take 10 mg by mouth at bedtime.    Marland Kitchen. estropipate (OGEN) 0.75 MG tablet Take 0.75 mg by mouth daily.    . fentaNYL (DURAGESIC - DOSED MCG/HR) 50 MCG/HR Place 50 mcg onto the skin every 3 (three) days.    . Fluticasone-Salmeterol (ADVAIR) 250-50 MCG/DOSE AEPB Inhale 1 puff into the lungs 2 (two) times daily.    . furosemide (LASIX) 40 MG tablet Take 40 mg by mouth daily.    . lansoprazole (PREVACID) 30 MG capsule Take 30 mg by mouth daily at 12 noon.    . mupirocin ointment (BACTROBAN) 2 % Apply 1 application topically 3 (three) times daily.    . pregabalin (LYRICA) 75 MG capsule Take 75 mg by mouth 2 (two) times daily.    . simvastatin (ZOCOR) 10 MG tablet Take 10 mg by mouth daily.    . SUMAtriptan (IMITREX) 25 MG tablet Take 25 mg by mouth as needed for migraine. May repeat in 2 hours if headache persists or recurs.    Marland Kitchen. tiotropium  (SPIRIVA) 18 MCG inhalation capsule Place 18 mcg into inhaler and inhale daily.    . traMADol (ULTRAM) 50 MG tablet Take 50 mg by mouth 4 (four) times daily as needed.     No current facility-administered medications for this visit.    Functional Status:  In your present state of health, do you have any difficulty performing the following activities: 03/30/2015  Hearing? N  Vision? N  Difficulty concentrating or making decisions? Y  Walking or climbing stairs? Y  Dressing or bathing? N  Doing errands, shopping? Y  Preparing Food and eating ? Y  Using the Toilet? N  In the past six months, have you accidently leaked urine? Y  Do you have problems with loss of bowel control? Y  Managing your Medications? Y  Managing your Finances? Y  Housekeeping or managing your Housekeeping? N    Fall/Depression Screening: PHQ 2/9 Scores 03/30/2015  PHQ - 2 Score 0   THN CM Care Plan Problem One        Patient Outreach Telephone from 03/30/2015 in [facility]   Care Plan Problem One  Ineffective airway clearance related to Chronic obstructive pulmonary disease.     Care Plan for Problem One  Active   THN Long Term Goal (31-90 days)  Patient will be able to explain COPD zones on action plan within the next 90 days   THN Long Term Goal Start Date  03/30/15   Interventions for Problem One Long Term Goal  Health Coach to send COPD information to patient that includes action plan.   [facility] CM Short Term Goal #1 (0-30 days)  Patient will be able to describe COPD green zone within 30 days.    [facility] CM Short Term Goal #1 Start Date  03/30/15   Interventions for Short Term Goal #1  Discussed with patient green zone on action plan.  Action plan sent to patient   [facility] CM Short Term Goal #2 (0-30 days)  Patient will be able to describe techniques of controlling breath during episodes of shortness breath within 30 days.   [facility] CM Short Term Goal #2 Start Date  03/30/15   Interventions for Short Term Goal #2   Discussed with patient pursed lipped breath. Discussed use of rescue inhaler.  Health Coach encouraged patient to use frequent rest periods to conserve energy.       Assessment: [facility] would benefit [facility] disease [facility] to assist patient in maintaining health status.    Plan: RN Health Coach will continue telephonic outreach with patient for COPD disease management. RN Health Coach will contact patient within the next month and patient agrees to next follow up call.   Bary Lericheionne J Eulalah Rupert, RN, MSN [facility] Care Management [phone]

## 2015-04-05 DIAGNOSIS — I10 Essential (primary) hypertension: Secondary | ICD-10-CM | POA: Diagnosis not present

## 2015-04-05 DIAGNOSIS — G629 Polyneuropathy, unspecified: Secondary | ICD-10-CM | POA: Diagnosis not present

## 2015-04-05 DIAGNOSIS — J449 Chronic obstructive pulmonary disease, unspecified: Secondary | ICD-10-CM | POA: Diagnosis not present

## 2015-04-05 DIAGNOSIS — N390 Urinary tract infection, site not specified: Secondary | ICD-10-CM | POA: Diagnosis not present

## 2015-04-05 DIAGNOSIS — G8929 Other chronic pain: Secondary | ICD-10-CM | POA: Diagnosis not present

## 2015-04-05 DIAGNOSIS — F17210 Nicotine dependence, cigarettes, uncomplicated: Secondary | ICD-10-CM | POA: Diagnosis not present

## 2015-04-05 DIAGNOSIS — A499 Bacterial infection, unspecified: Secondary | ICD-10-CM | POA: Diagnosis not present

## 2015-04-06 DIAGNOSIS — M47816 Spondylosis without myelopathy or radiculopathy, lumbar region: Secondary | ICD-10-CM | POA: Diagnosis not present

## 2015-04-06 DIAGNOSIS — M791 Myalgia: Secondary | ICD-10-CM | POA: Diagnosis not present

## 2015-04-06 DIAGNOSIS — Z79891 Long term (current) use of opiate analgesic: Secondary | ICD-10-CM | POA: Diagnosis not present

## 2015-04-06 DIAGNOSIS — G8929 Other chronic pain: Secondary | ICD-10-CM | POA: Diagnosis not present

## 2015-04-06 DIAGNOSIS — M5416 Radiculopathy, lumbar region: Secondary | ICD-10-CM | POA: Diagnosis not present

## 2015-04-06 DIAGNOSIS — F419 Anxiety disorder, unspecified: Secondary | ICD-10-CM | POA: Diagnosis not present

## 2015-04-06 DIAGNOSIS — M545 Low back pain: Secondary | ICD-10-CM | POA: Diagnosis not present

## 2015-04-11 DIAGNOSIS — I10 Essential (primary) hypertension: Secondary | ICD-10-CM | POA: Diagnosis not present

## 2015-04-11 DIAGNOSIS — G609 Hereditary and idiopathic neuropathy, unspecified: Secondary | ICD-10-CM | POA: Diagnosis not present

## 2015-04-11 DIAGNOSIS — A499 Bacterial infection, unspecified: Secondary | ICD-10-CM | POA: Diagnosis not present

## 2015-04-11 DIAGNOSIS — J449 Chronic obstructive pulmonary disease, unspecified: Secondary | ICD-10-CM | POA: Diagnosis not present

## 2015-04-11 DIAGNOSIS — G629 Polyneuropathy, unspecified: Secondary | ICD-10-CM | POA: Diagnosis not present

## 2015-04-11 DIAGNOSIS — R296 Repeated falls: Secondary | ICD-10-CM | POA: Diagnosis not present

## 2015-04-11 DIAGNOSIS — G3184 Mild cognitive impairment, so stated: Secondary | ICD-10-CM | POA: Diagnosis not present

## 2015-04-11 DIAGNOSIS — R55 Syncope and collapse: Secondary | ICD-10-CM | POA: Diagnosis not present

## 2015-04-13 DIAGNOSIS — I10 Essential (primary) hypertension: Secondary | ICD-10-CM | POA: Diagnosis not present

## 2015-04-13 DIAGNOSIS — G629 Polyneuropathy, unspecified: Secondary | ICD-10-CM | POA: Diagnosis not present

## 2015-04-13 DIAGNOSIS — J449 Chronic obstructive pulmonary disease, unspecified: Secondary | ICD-10-CM | POA: Diagnosis not present

## 2015-04-13 DIAGNOSIS — A499 Bacterial infection, unspecified: Secondary | ICD-10-CM | POA: Diagnosis not present

## 2015-04-16 DIAGNOSIS — G609 Hereditary and idiopathic neuropathy, unspecified: Secondary | ICD-10-CM | POA: Diagnosis not present

## 2015-04-16 DIAGNOSIS — J449 Chronic obstructive pulmonary disease, unspecified: Secondary | ICD-10-CM | POA: Diagnosis not present

## 2015-04-16 DIAGNOSIS — R110 Nausea: Secondary | ICD-10-CM | POA: Diagnosis not present

## 2015-04-16 DIAGNOSIS — N390 Urinary tract infection, site not specified: Secondary | ICD-10-CM | POA: Diagnosis not present

## 2015-04-23 DIAGNOSIS — J449 Chronic obstructive pulmonary disease, unspecified: Secondary | ICD-10-CM | POA: Diagnosis not present

## 2015-04-23 DIAGNOSIS — R29898 Other symptoms and signs involving the musculoskeletal system: Secondary | ICD-10-CM | POA: Diagnosis not present

## 2015-04-23 DIAGNOSIS — G629 Polyneuropathy, unspecified: Secondary | ICD-10-CM | POA: Diagnosis not present

## 2015-04-23 DIAGNOSIS — I2510 Atherosclerotic heart disease of native coronary artery without angina pectoris: Secondary | ICD-10-CM | POA: Diagnosis not present

## 2015-05-01 ENCOUNTER — Other Ambulatory Visit: Payer: Self-pay

## 2015-05-01 NOTE — Patient Outreach (Signed)
[facility]) Care Management  05/01/2015   Cheryl [name] 01-11-1951 [id number]  Subjective: Telephone call to patient she reports she is doing ok.  She reports she has been having some increased problems with shortness of breath.  She states she uses her rescue inhalers and her oxygen and that gives her relief.  Patient continues to smoke.  No desires to quit.  Patient rates back pain this morning 4/10 but states it worse as she is up doing her house work but states she paces her self and rests often.  Asked patient about her [facility] welcome packet and consents. She is not sure where it maybe but will look and ask her husband.  Advised that if she cannot find it to let health coach know.  She verbalized understanding.  Patient reports 3-4 falls in the last month. She denies injury and states she has balance issues.  Advised on fall precautions.  Will send info on falls prevention.      Current Medications:  Current Outpatient Prescriptions  Medication Sig Dispense Refill  . acetaminophen (TYLENOL) 500 MG tablet Take 500 mg by mouth every 6 (six) hours as needed (2 tablets).    Marland Kitchen albuterol (PROVENTIL HFA;VENTOLIN HFA) 108 (90 BASE) MCG/ACT inhaler Inhale 2 puffs into the lungs every 4 (four) hours as needed for wheezing or shortness of breath.    . ALPRAZolam (XANAX) 0.5 MG tablet Take 0.5 mg by mouth at bedtime as needed.    . celecoxib (CELEBREX) 200 MG capsule Take 200 mg by mouth 2 (two) times daily.    . clopidogrel (PLAVIX) 75 MG tablet Take 75 mg by mouth daily.    Marland Kitchen donepezil (ARICEPT) 10 MG tablet Take 10 mg by mouth at bedtime.    Marland Kitchen estropipate (OGEN) 0.75 MG tablet Take 0.75 mg by mouth daily.    . fentaNYL (DURAGESIC - DOSED MCG/HR) 50 MCG/HR Place 50 mcg onto the skin every 3 (three) days.    . Fluticasone-Salmeterol (ADVAIR) 250-50 MCG/DOSE AEPB Inhale 1 puff into the lungs 2 (two) times daily.    . furosemide (LASIX) 40 MG tablet Take 40 mg by mouth daily.    .  lansoprazole (PREVACID) 30 MG capsule Take 30 mg by mouth daily at 12 noon.    . mupirocin ointment (BACTROBAN) 2 % Apply 1 application topically 3 (three) times daily.    . pregabalin (LYRICA) 75 MG capsule Take 75 mg by mouth 2 (two) times daily.    . simvastatin (ZOCOR) 10 MG tablet Take 10 mg by mouth daily.    . SUMAtriptan (IMITREX) 25 MG tablet Take 25 mg by mouth as needed for migraine. May repeat in 2 hours if headache persists or recurs.    Marland Kitchen tiotropium (SPIRIVA) 18 MCG inhalation capsule Place 18 mcg into inhaler and inhale daily.    . traMADol (ULTRAM) 50 MG tablet Take 50 mg by mouth 4 (four) times daily as needed.     No current facility-administered medications for this visit.    Functional Status:  In your present state of health, do you have any difficulty performing the following activities: 05/01/2015 03/30/2015  Hearing? N N  Vision? N N  Difficulty concentrating or making decisions? [name]  Walking or climbing stairs? Y Y  Dressing or bathing? N N  Doing errands, shopping? [name]  Preparing Food and eating ? Y Y  Using the Toilet? N N  In the past six months, have you accidently leaked urine?  N Y  Do you have problems with loss of bowel control? N Y  Managing your Medications? Y Y  Managing your Finances? [name]  Housekeeping or managing your Housekeeping? Aggie Moats    Fall/Depression Screening: PHQ 2/9 Scores 05/01/2015 03/30/2015  PHQ - 2 Score 0 0    THN CM Care Plan Problem One        Patient Outreach Telephone from 05/01/2015 in [place] CM Short Term Goal #1 Met Date  05/01/15   [facility]N CM Short Term Goal #2 Met Date  05/01/15   [facility] CM Short Term Goal #3 (0-30 days)  Patient will be able to describe yellow zone within 30 days.   THN CM Short Term Goal #3 Start Date  05/01/15   Interventions for Short Tern Goal #3  Discussed COPD yellow zone with patient from the action plan.        Assessment: Patient will continue to benefit from Health Coach calls for  education and management of COPD.     Plan:  RN Health Coach will send information on fall prevention and new consent.  RN Health Coach will contact patient within one month.  Patient agrees to next outreach.    Jone Baseman, RN, MSN [place] [phone]

## 2015-05-06 ENCOUNTER — Other Ambulatory Visit: Payer: Self-pay | Admitting: Family Medicine

## 2015-05-17 DIAGNOSIS — N390 Urinary tract infection, site not specified: Secondary | ICD-10-CM | POA: Diagnosis not present

## 2015-05-17 DIAGNOSIS — R110 Nausea: Secondary | ICD-10-CM | POA: Diagnosis not present

## 2015-05-17 DIAGNOSIS — G609 Hereditary and idiopathic neuropathy, unspecified: Secondary | ICD-10-CM | POA: Diagnosis not present

## 2015-05-17 DIAGNOSIS — J449 Chronic obstructive pulmonary disease, unspecified: Secondary | ICD-10-CM | POA: Diagnosis not present

## 2015-05-24 ENCOUNTER — Telehealth: Payer: Self-pay | Admitting: Emergency Medicine

## 2015-05-24 DIAGNOSIS — G629 Polyneuropathy, unspecified: Secondary | ICD-10-CM | POA: Diagnosis not present

## 2015-05-24 DIAGNOSIS — J449 Chronic obstructive pulmonary disease, unspecified: Secondary | ICD-10-CM | POA: Diagnosis not present

## 2015-05-24 DIAGNOSIS — R29898 Other symptoms and signs involving the musculoskeletal system: Secondary | ICD-10-CM | POA: Diagnosis not present

## 2015-05-24 DIAGNOSIS — I2510 Atherosclerotic heart disease of native coronary artery without angina pectoris: Secondary | ICD-10-CM | POA: Diagnosis not present

## 2015-05-24 MED ORDER — PANTOPRAZOLE SODIUM 40 MG PO TBEC: 40.0000 mg | DELAYED_RELEASE_TABLET | Freq: Two times a day (BID) | ORAL | Status: DC

## 2015-05-24 NOTE — Telephone Encounter (Signed)
Pt pharmacy sent over request for refill for pt medication.  

## 2015-05-26 ENCOUNTER — Telehealth: Payer: Self-pay

## 2015-05-26 NOTE — Telephone Encounter (Signed)
Pharmacy sent refill request for Pantoprazole. This medication has already been refilled by Dr. Sherrie MustacheFisher on 05/24/2015.

## 2015-05-30 ENCOUNTER — Other Ambulatory Visit: Payer: Self-pay

## 2015-05-30 ENCOUNTER — Ambulatory Visit: Payer: Self-pay

## 2015-05-30 NOTE — Patient Outreach (Signed)
[facility]) Care Management  05/30/2015  Cheryl [name] November 15, 1951 [id number]   Telephone call to patient for monthly outreach.  Patient states she can not talk with health coach right now.  Patient agreed to call back on next week.    Plan: RN Health Coach will contact patient within the next week.     Bary Lericheionne J Saahas Hidrogo, RN, MSN [facility] Care Management RN Telephonic Health Coach [phone]

## 2015-06-06 ENCOUNTER — Other Ambulatory Visit: Payer: Self-pay

## 2015-06-06 DIAGNOSIS — K219 Gastro-esophageal reflux disease without esophagitis: Secondary | ICD-10-CM

## 2015-06-06 DIAGNOSIS — E785 Hyperlipidemia, unspecified: Secondary | ICD-10-CM

## 2015-06-06 DIAGNOSIS — J441 Chronic obstructive pulmonary disease with (acute) exacerbation: Secondary | ICD-10-CM | POA: Insufficient documentation

## 2015-06-06 DIAGNOSIS — I10 Essential (primary) hypertension: Secondary | ICD-10-CM | POA: Insufficient documentation

## 2015-06-06 NOTE — Patient Outreach (Signed)
[facility]) Care Management  [place]  06/06/2015   Cheryl [name] Jan 29, 1951 [id number]  Telephone call to patient for monthly outreach.  Patient reports she is doing ok but busy.  Patient reports that her breathing has been some worse on days the humidity is up. Patient has not had to use rescue inhaler but has used her oxygen.  Patient reports she stays busy doing her house work but has to take frequent rest breaks. Discussed with patient using pursed lipped breathing along with rest to help with episodes of shortness of breath.  She verbalized understanding. No concerns voiced.  Consent: Patient states she thought she sent consent back.  No consent found.  Advised patient that health coach would send another consent and please return as soon as she gets it. She verbalized understanding.   Current Medications:  Current Outpatient Prescriptions  Medication Sig Dispense Refill  . acetaminophen (TYLENOL) 500 MG tablet Take 500 mg by mouth every 6 (six) hours as needed (2 tablets).    Marland Kitchen albuterol (PROVENTIL HFA;VENTOLIN HFA) 108 (90 BASE) MCG/ACT inhaler Inhale 2 puffs into the lungs every 4 (four) hours as needed for wheezing or shortness of breath.    . ALPRAZolam (XANAX) 0.5 MG tablet Take 0.5 mg by mouth at bedtime as needed.    . celecoxib (CELEBREX) 200 MG capsule Take 200 mg by mouth 2 (two) times daily.    . clopidogrel (PLAVIX) 75 MG tablet Take 75 mg by mouth daily.    . cyclobenzaprine (FLEXERIL) 5 MG tablet take 1 to 2 tablets by mouth every 8 hours if needed 30 tablet 0  . donepezil (ARICEPT) 10 MG tablet Take 10 mg by mouth at bedtime.    Marland Kitchen estropipate (OGEN) 0.75 MG tablet Take 0.75 mg by mouth daily.    . fentaNYL (DURAGESIC - DOSED MCG/HR) 50 MCG/HR Place 50 mcg onto the skin every 3 (three) days.    . Fluticasone-Salmeterol (ADVAIR) 250-50 MCG/DOSE AEPB Inhale 1 puff into the lungs 2 (two) times daily.    . furosemide (LASIX) 40 MG tablet Take  40 mg by mouth daily.    . mupirocin ointment (BACTROBAN) 2 % Apply 1 application topically 3 (three) times daily.    . pantoprazole (PROTONIX) 40 MG tablet Take 1 tablet (40 mg total) by mouth 2 (two) times daily. 60 tablet 12  . pregabalin (LYRICA) 75 MG capsule Take 75 mg by mouth 2 (two) times daily.    . simvastatin (ZOCOR) 10 MG tablet Take 10 mg by mouth daily.    . SUMAtriptan (IMITREX) 25 MG tablet Take 25 mg by mouth as needed for migraine. May repeat in 2 hours if headache persists or recurs.    Marland Kitchen tiotropium (SPIRIVA) 18 MCG inhalation capsule Place 18 mcg into inhaler and inhale daily.    . traMADol (ULTRAM) 50 MG tablet Take 50 mg by mouth 4 (four) times daily as needed.     No current facility-administered medications for this visit.    Functional Status:  In your present state of health, do you have any difficulty performing the following activities: 05/01/2015 03/30/2015  Hearing? N N  Vision? N N  Difficulty concentrating or making decisions? Cheryl [name]  Walking or climbing stairs? Y Y  Dressing or bathing? N N  Doing errands, shopping? Cheryl [name]  Preparing Food and eating ? Y Y  Using the Toilet? N N  In the past six months, have you accidently leaked urine? N  Y  Do you have problems with loss of bowel control? N Y  Managing your Medications? Y Y  Managing your Finances? Cheryl [name]  Housekeeping or managing your Housekeeping? Cheryl [name]    Fall/Depression Screening: PHQ 2/9 Scores 06/06/2015 05/01/2015 03/30/2015  PHQ - 2 Score 0 0 0   [facility] CM Care Plan Problem One        Patient Outreach Telephone from 06/06/2015 in [place] Problem One  Ineffective airway clearance related to Chronic obstructive pulmonary disease.     Care Plan for Problem One  Active   THN Long Term Goal (31-90 days)  Patient will be able to explain COPD zones on action plan within the next 90 days   THN Long Term Goal Start Date  06/06/15 [goal restarted]   Interventions for Problem One Long Term  Goal  Health Coach to send COPD information to patient that includes action plan.  Disucssed COPD zones with patient.  Discussed use of rescue inhaler.    THN CM Short Term Goal #1 (0-30 days)  Patient will be able to describe COPD green zone within 30 days.    [facility]N CM Short Term Goal #1 Start Date  03/30/15   [facility] CM Short Term Goal #1 Met Date  05/01/15   Interventions for Short Term Goal #1  Discussed with patient green zone on action plan.  Action plan sent to patient   [facility] CM Short Term Goal #2 (0-30 days)  Patient will be able to describe techniques of controlling breath during episodes of shortness breath within 30 days.   [facility]N CM Short Term Goal #2 Start Date  03/30/15   [facility] CM Short Term Goal #2 Met Date  05/01/15   Interventions for Short Term Goal #2  Discussed with patient pursed lipped breath. Discussed use of rescue inhaler.  Health Coach encouraged patient to use frequent rest periods to conserve energy.    [facility]N CM Short Term Goal #3 (0-30 days)  Patient will be able to describe yellow zone within 30 days.   [facility]N CM Short Term Goal #3 Start Date  06/06/15 [goal restarted]   Interventions for Short Tern Goal #3  Discussed COPD yellow zone with patient from the action plan.  Discussed with patient use of rescue inhaler and pursed lip breathing to assist with shortness of breath.        Assessment: Patient will continue to benefit from health coach calls for COPD education and management.  Plan:  RN Health Coach will contact patient within the next month and patient agrees to next outreach.  RN Health Coach will mail another consent to patient for completion and return.    Jone Baseman, RN, MSN [place] [phone]

## 2015-06-15 DIAGNOSIS — D500 Iron deficiency anemia secondary to blood loss (chronic): Secondary | ICD-10-CM | POA: Diagnosis not present

## 2015-06-15 DIAGNOSIS — R159 Full incontinence of feces: Secondary | ICD-10-CM | POA: Diagnosis not present

## 2015-06-16 DIAGNOSIS — N390 Urinary tract infection, site not specified: Secondary | ICD-10-CM | POA: Diagnosis not present

## 2015-06-16 DIAGNOSIS — R110 Nausea: Secondary | ICD-10-CM | POA: Diagnosis not present

## 2015-06-16 DIAGNOSIS — G609 Hereditary and idiopathic neuropathy, unspecified: Secondary | ICD-10-CM | POA: Diagnosis not present

## 2015-06-16 DIAGNOSIS — J449 Chronic obstructive pulmonary disease, unspecified: Secondary | ICD-10-CM | POA: Diagnosis not present

## 2015-06-23 DIAGNOSIS — I2510 Atherosclerotic heart disease of native coronary artery without angina pectoris: Secondary | ICD-10-CM | POA: Diagnosis not present

## 2015-06-23 DIAGNOSIS — J449 Chronic obstructive pulmonary disease, unspecified: Secondary | ICD-10-CM | POA: Diagnosis not present

## 2015-06-23 DIAGNOSIS — R29898 Other symptoms and signs involving the musculoskeletal system: Secondary | ICD-10-CM | POA: Diagnosis not present

## 2015-06-23 DIAGNOSIS — G629 Polyneuropathy, unspecified: Secondary | ICD-10-CM | POA: Diagnosis not present

## 2015-06-28 ENCOUNTER — Ambulatory Visit: Payer: Self-pay | Admitting: Family Medicine

## 2015-06-28 DIAGNOSIS — G894 Chronic pain syndrome: Secondary | ICD-10-CM | POA: Diagnosis not present

## 2015-06-28 DIAGNOSIS — Z79891 Long term (current) use of opiate analgesic: Secondary | ICD-10-CM | POA: Diagnosis not present

## 2015-06-28 DIAGNOSIS — F1120 Opioid dependence, uncomplicated: Secondary | ICD-10-CM | POA: Diagnosis not present

## 2015-06-28 DIAGNOSIS — Z5181 Encounter for therapeutic drug level monitoring: Secondary | ICD-10-CM | POA: Diagnosis not present

## 2015-06-28 DIAGNOSIS — Z7189 Other specified counseling: Secondary | ICD-10-CM | POA: Diagnosis not present

## 2015-07-03 ENCOUNTER — Encounter: Payer: Self-pay | Admitting: Family Medicine

## 2015-07-03 ENCOUNTER — Other Ambulatory Visit: Payer: Self-pay

## 2015-07-03 ENCOUNTER — Encounter: Payer: Self-pay | Admitting: *Deleted

## 2015-07-03 ENCOUNTER — Ambulatory Visit (INDEPENDENT_AMBULATORY_CARE_PROVIDER_SITE_OTHER): Payer: Commercial Managed Care - HMO | Admitting: Family Medicine

## 2015-07-03 ENCOUNTER — Ambulatory Visit: Payer: Commercial Managed Care - HMO

## 2015-07-03 VITALS — BP 120/80 | HR 79 | Temp 98.4°F | Resp 16 | Wt 100.0 lb

## 2015-07-03 DIAGNOSIS — R55 Syncope and collapse: Secondary | ICD-10-CM | POA: Insufficient documentation

## 2015-07-03 DIAGNOSIS — E785 Hyperlipidemia, unspecified: Secondary | ICD-10-CM | POA: Diagnosis not present

## 2015-07-03 DIAGNOSIS — J45909 Unspecified asthma, uncomplicated: Secondary | ICD-10-CM | POA: Insufficient documentation

## 2015-07-03 DIAGNOSIS — Z8719 Personal history of other diseases of the digestive system: Secondary | ICD-10-CM | POA: Insufficient documentation

## 2015-07-03 DIAGNOSIS — R7989 Other specified abnormal findings of blood chemistry: Secondary | ICD-10-CM | POA: Insufficient documentation

## 2015-07-03 DIAGNOSIS — F068 Other specified mental disorders due to known physiological condition: Secondary | ICD-10-CM

## 2015-07-03 DIAGNOSIS — F09 Unspecified mental disorder due to known physiological condition: Secondary | ICD-10-CM

## 2015-07-03 DIAGNOSIS — G43909 Migraine, unspecified, not intractable, without status migrainosus: Secondary | ICD-10-CM | POA: Insufficient documentation

## 2015-07-03 DIAGNOSIS — G4700 Insomnia, unspecified: Secondary | ICD-10-CM | POA: Insufficient documentation

## 2015-07-03 DIAGNOSIS — L02419 Cutaneous abscess of limb, unspecified: Secondary | ICD-10-CM

## 2015-07-03 DIAGNOSIS — I10 Essential (primary) hypertension: Secondary | ICD-10-CM | POA: Diagnosis not present

## 2015-07-03 DIAGNOSIS — K219 Gastro-esophageal reflux disease without esophagitis: Secondary | ICD-10-CM

## 2015-07-03 DIAGNOSIS — G8929 Other chronic pain: Secondary | ICD-10-CM | POA: Insufficient documentation

## 2015-07-03 DIAGNOSIS — M546 Pain in thoracic spine: Secondary | ICD-10-CM

## 2015-07-03 DIAGNOSIS — L03115 Cellulitis of right lower limb: Secondary | ICD-10-CM

## 2015-07-03 DIAGNOSIS — N951 Menopausal and female climacteric states: Secondary | ICD-10-CM | POA: Insufficient documentation

## 2015-07-03 DIAGNOSIS — R0602 Shortness of breath: Secondary | ICD-10-CM | POA: Insufficient documentation

## 2015-07-03 DIAGNOSIS — M810 Age-related osteoporosis without current pathological fracture: Secondary | ICD-10-CM | POA: Insufficient documentation

## 2015-07-03 DIAGNOSIS — M461 Sacroiliitis, not elsewhere classified: Secondary | ICD-10-CM | POA: Insufficient documentation

## 2015-07-03 DIAGNOSIS — R61 Generalized hyperhidrosis: Secondary | ICD-10-CM | POA: Insufficient documentation

## 2015-07-03 DIAGNOSIS — F17200 Nicotine dependence, unspecified, uncomplicated: Secondary | ICD-10-CM | POA: Insufficient documentation

## 2015-07-03 DIAGNOSIS — R040 Epistaxis: Secondary | ICD-10-CM | POA: Insufficient documentation

## 2015-07-03 DIAGNOSIS — R32 Unspecified urinary incontinence: Secondary | ICD-10-CM | POA: Insufficient documentation

## 2015-07-03 DIAGNOSIS — R159 Full incontinence of feces: Secondary | ICD-10-CM | POA: Insufficient documentation

## 2015-07-03 DIAGNOSIS — F410 Panic disorder [episodic paroxysmal anxiety] without agoraphobia: Secondary | ICD-10-CM | POA: Insufficient documentation

## 2015-07-03 DIAGNOSIS — T148XXA Other injury of unspecified body region, initial encounter: Secondary | ICD-10-CM | POA: Insufficient documentation

## 2015-07-03 DIAGNOSIS — R609 Edema, unspecified: Secondary | ICD-10-CM | POA: Insufficient documentation

## 2015-07-03 DIAGNOSIS — L03119 Cellulitis of unspecified part of limb: Secondary | ICD-10-CM

## 2015-07-03 DIAGNOSIS — R296 Repeated falls: Secondary | ICD-10-CM | POA: Insufficient documentation

## 2015-07-03 DIAGNOSIS — G629 Polyneuropathy, unspecified: Secondary | ICD-10-CM | POA: Insufficient documentation

## 2015-07-03 DIAGNOSIS — R195 Other fecal abnormalities: Secondary | ICD-10-CM | POA: Insufficient documentation

## 2015-07-03 DIAGNOSIS — R29898 Other symptoms and signs involving the musculoskeletal system: Secondary | ICD-10-CM | POA: Insufficient documentation

## 2015-07-03 MED ORDER — CEPHALEXIN 500 MG PO CAPS: 500.0000 mg | ORAL_CAPSULE | Freq: Four times a day (QID) | ORAL | Status: AC

## 2015-07-03 NOTE — Progress Notes (Signed)
Patient: Cheryl [name] Female    DOB: 17-Apr-1951   64 y.o.   MRN: [id number] Visit Date: 07/09/2015  Today's Provider: Mila Merry, MD   Chief Complaint  Patient presents with  . Follow-up    6 month  . Hyperlipidemia  . Hypertension  . COPD   Subjective:    HPI  Follow-u[ for COPD from 08/17/2014; no changes. Follow-up for CAD from 12/28/2014; no changes.    Hypertension, follow-up:  BP Readings from Last 3 Encounters:  07/03/15 120/80    She was last seen for hypertension 1 years ago.  BP at that visit was 120/80  Management changes since that visit include none .She reports good compliance with treatment. She is not having side effects. none  She is not exercising. She is adherent to low salt diet.   Outside blood pressures are low. She is experiencing chest pain.  Patient denies none.   Cardiovascular risk factors include smoking/ tobacco exposure.  Use of agents associated with hypertension: none.   ------------------------------------------------------------------------    Lipid/Cholesterol, Follow-up:   Last seen for this 6 months ago.  Management changes since that visit include none.  Last Lipid Panel:    Component Value Date/Time   CHOL 153 07/03/2015 1205   CHOL  10/31/2009 0443    165 (NOTE) ATP III Classification:      < 200        mg/dL        Desirable     409 - 239     mg/dL        Borderline High     >= 240        mg/dL        High    TRIG 76 07/03/2015 1205   HDL 93 07/03/2015 1205   HDL 72 10/31/2009 0443   CHOLHDL 1.6 07/03/2015 1205   CHOLHDL 2.3 10/31/2009 0443   VLDL 21 10/31/2009 0443   LDLCALC 45 07/03/2015 1205   LDLCALC  10/31/2009 0443    72 (NOTE)  Total Cholesterol/HDL Ratio:CHD Risk                       Coronary Heart Disease Risk Table                                       Men       Women         1/2 Average Risk              3.4        3.3             Average Risk              5.0         4.4         2 X  Average Risk              9.6        7.1         3 X Average Risk             23.4       11.0 Use the calculated Patient Ratio above and the CHD Risk table  to determine the patient's CHD Risk. ATP III Classification (LDL):      < 100  mg/dL         Optimal     161 - 129     mg/dL         Near or Above Optimal     130 - 159     mg/dL         Borderline High     160 - 189     mg/dL         High      > 096        mg/dL         Very High     She reports good compliance with treatment. She is not having side effects. none  Wt Readings from Last 3 Encounters:  07/03/15 100 lb (45.36 kg)  03/30/15 100 lb (45.36 kg)    ------------------------------------------------------------------------  Follow up Memory She states the Aricept has been helping her memory quite a bit and is tolerating well without adverse effects.     Allergies  Allergen Reactions  . Percocet [Oxycodone-Acetaminophen] Hives and Rash    Unknown reaction  . Aspirin Nausea Only    UNKNOWN GI upset Unknown reaction  . Codeine Other (See Comments) and Nausea Only    UNKNOWN GI upset Unknown reaction  . Morphine Other (See Comments)    Reaction unknown  . Morphine And Related     Reaction unknown  . Morphine Sulfate   . Pregabalin     Sleep  . Propoxyphene     GI upset  . Sulfa Antibiotics Rash    GI upset UNKNOWN Unknown reaction   Previous Medications   ACETAMINOPHEN (TYLENOL) 325 MG TABLET    Take by mouth.   ACETAMINOPHEN (TYLENOL) 500 MG TABLET    Take 500 mg by mouth every 6 (six) hours as needed (2 tablets).   ACETAMINOPHEN (TYLENOL) 500 MG TABLET    Take by mouth.   ALBUTEROL (PROVENTIL HFA;VENTOLIN HFA) 108 (90 BASE) MCG/ACT INHALER    Inhale 2 puffs into the lungs every 4 (four) hours as needed for wheezing or shortness of breath.   ALBUTEROL (VENTOLIN HFA) 108 (90 BASE) MCG/ACT INHALER    Inhale 2 Inhalers into the lungs every 6 (six) hours as needed.   ALPRAZOLAM (XANAX) 0.5 MG TABLET     Take 0.5 mg by mouth at bedtime as needed.   CELECOXIB (CELEBREX) 200 MG CAPSULE    Take 200 mg by mouth 2 (two) times daily.   CLOPIDOGREL (PLAVIX) 75 MG TABLET    Take 75 mg by mouth.   CYCLOBENZAPRINE (FLEXERIL) 5 MG TABLET    take 1 to 2 tablets by mouth every 8 hours if needed   DIPHENOXYLATE-ATROPINE (LOMOTIL) 2.5-0.025 MG PER TABLET    Take by mouth.   DONEPEZIL (ARICEPT) 10 MG TABLET    Take 10 mg by mouth at bedtime.   ESTROPIPATE (OGEN) 0.75 MG TABLET    Take 0.75 mg by mouth daily.   FENTANYL (DURAGESIC - DOSED MCG/HR) 50 MCG/HR    Place 50 mcg onto the skin every 3 (three) days.   FLUTICASONE-SALMETEROL (ADVAIR) 250-50 MCG/DOSE AEPB    Inhale 1 puff into the lungs 2 (two) times daily.   FUROSEMIDE (LASIX) 40 MG TABLET    Take 40 mg by mouth daily.   GABAPENTIN (NEURONTIN) 800 MG TABLET    Take 800 mg by mouth.   GAVILYTE-N WITH FLAVOR PACK 420 G SOLUTION    take by mouth as directed for COLONIC PREP  LANSOPRAZOLE (PREVACID) 30 MG CAPSULE    Take 1 capsule by mouth daily.   LYRICA 150 MG CAPSULE       MAGNESIUM OXIDE (MAG-OX) 400 MG TABLET    Take 400 mg by mouth.   MUPIROCIN OINTMENT (BACTROBAN) 2 %    Apply 1 application topically 3 (three) times daily.   PANTOPRAZOLE (PROTONIX) 40 MG TABLET    Take 1 tablet (40 mg total) by mouth 2 (two) times daily.   PREGABALIN (LYRICA) 75 MG CAPSULE    Take 75 mg by mouth 2 (two) times daily.   SILVER SULFADIAZINE (SILVADENE) 1 % CREAM    Apply 1 application topically daily.   SIMVASTATIN (ZOCOR) 10 MG TABLET    Take 10 mg by mouth daily.   SUMATRIPTAN (IMITREX) 25 MG TABLET    Take 25 mg by mouth as needed for migraine. May repeat in 2 hours if headache persists or recurs.   TIOTROPIUM (SPIRIVA) 18 MCG INHALATION CAPSULE    Place 18 mcg into inhaler and inhale daily.   TRAMADOL (ULTRAM) 50 MG TABLET    Take 50 mg by mouth 4 (four) times daily as needed.   VITAMIN D, ERGOCALCIFEROL, (DRISDOL) 50000 UNITS CAPS CAPSULE    Take by mouth.     Review of Systems  Constitutional: Negative for fever, chills, appetite change and fatigue.  Respiratory: Negative for chest tightness and shortness of breath.   Cardiovascular: Negative for chest pain and palpitations.  Gastrointestinal: Negative for nausea, vomiting and abdominal pain.  Neurological: Negative for dizziness and weakness.    Social History  Substance Use Topics  . Smoking status: Not on file  . Smokeless tobacco: Not on file  . Alcohol Use: Not on file   Objective:   BP 120/80 mmHg  Pulse 79  Temp(Src) 98.4 F (36.9 C) (Oral)  Resp 16  Wt 100 lb (45.36 kg)  SpO2 96%  Physical Exam  General Appearance:    Alert, cooperative, no distress, thin, appears older that chronological age.   Eyes:    PERRL, conjunctiva/corneas clear, EOM's intact       Lungs:     Clear to auscultation bilaterally, respirations unlabored  Heart:    Regular rate and rhythm  Neurologic:   Awake, alert, oriented x 3. No apparent focal neurological           defect.   Derm:   Right lower leg mildly erythematous        Assessment & Plan:      1. Cellulitis and abscess of leg, except foot Cephalexin  QID. Call if symptoms change or if not rapidly improving.   2. Mild cognitive disorder Doing well with Aricept   3. Essential hypertension well controlled Continue current medications.   - Renal function panel  4. Gastroesophageal reflux disease, esophagitis presence not specified   5. Hyperlipemia She is tolerating simvastatin well with no adverse effects.    - Lipid panel - T4 AND TSH  6. Cellulitis of right lower extremity  - cephALEXin (KEFLEX) 500 MG capsule; Take 1 capsule (500 mg total) by mouth 4 (four) times daily.  Dispense: 40 capsule; Refill: 0       Mila Merry, MD  [facility]

## 2015-07-03 NOTE — Patient Outreach (Signed)
[facility]) Care Management  07/03/2015  Cheryl [name] [birth year] [id number]   Telephone call to patient for disease management follow up.  Unable to reach.  HIPPA compliant voice message left.  Plan: RN Health Coach will attempt telephone outreach within 1-2 weeks.    Bary Leriche, RN, MSN [facility] Care Management RN Telephonic Health Coach [phone]

## 2015-07-04 ENCOUNTER — Telehealth: Payer: Self-pay

## 2015-07-04 LAB — RENAL FUNCTION PANEL
Albumin: 4.1 g/dL (ref 3.6–4.8)
BUN/Creatinine Ratio: 14 (ref 11–26)
BUN: 17 mg/dL (ref 8–27)
CHLORIDE: 100 mmol/L (ref 97–108)
CO2: 27 mmol/L (ref 18–29)
CREATININE: 1.19 mg/dL — AB (ref 0.57–1.00)
Calcium: 8.8 mg/dL (ref 8.7–10.3)
GFR calc Af Amer: 55 mL/min/{1.73_m2} — ABNORMAL LOW (ref >59)
GFR, EST NON AFRICAN AMERICAN: 48 mL/min/{1.73_m2} — AB (ref >59)
Glucose: 85 mg/dL (ref 65–99)
Phosphorus: 4.2 mg/dL (ref 2.5–4.5)
Potassium: 4.5 mmol/L (ref 3.5–5.2)
Sodium: 141 mmol/L (ref 134–144)

## 2015-07-04 LAB — LIPID PANEL
Chol/HDL Ratio: 1.6 ratio units (ref 0.0–4.4)
Cholesterol, Total: 153 mg/dL (ref 100–199)
HDL: 93 mg/dL (ref >39)
LDL Calculated: 45 mg/dL (ref 0–99)
Triglycerides: 76 mg/dL (ref 0–149)
VLDL Cholesterol Cal: 15 mg/dL (ref 5–40)

## 2015-07-04 LAB — T4 AND TSH
T4 TOTAL: 8 ug/dL (ref 4.5–12.0)
TSH: 2.98 u[IU]/mL (ref 0.450–4.500)

## 2015-07-04 NOTE — Telephone Encounter (Signed)
-----   Message from Malva Limes, MD sent at 07/04/2015  8:47 AM EDT ----- Labs all completely normal. Continue current medications.  Follow up 6 months.

## 2015-07-04 NOTE — Telephone Encounter (Signed)
Advised pt of the note below, pt verbalized fully understanding.  Thanks,

## 2015-07-09 DIAGNOSIS — L03119 Cellulitis of unspecified part of limb: Secondary | ICD-10-CM | POA: Insufficient documentation

## 2015-07-13 ENCOUNTER — Other Ambulatory Visit: Payer: Self-pay

## 2015-07-13 DIAGNOSIS — I10 Essential (primary) hypertension: Secondary | ICD-10-CM

## 2015-07-13 NOTE — Patient Outreach (Addendum)
[facility]) Care Management  [place]  07/13/2015   Zaira Iacovelli 12/26/1950 [id number]  Cellulitis: Telephone call to patient for monthly call.  She reports some problems with right leg and cellulitis of that leg.  Patient reports that she is on an antibiotic presently and will finish over the weekend.  Reports pain in her leg about a 5/10 this am.  Discussed with patient pain and infection and when to notify physician. She verbalized understanding.  Colonoscopy: Patient reports she is to have routine colonoscopy on 07-27-15 as she has one about every 5 years due to history of blood in her stool.    COPD: Patient reports that her breathing is sometimes bad with the warm weather.  Patient continues to smoke.  Discussed with patient staying in with warm weather when possible and using oxygen and rescue inhaler.  Also discussed with patient when notify physician.   Current Medications:  Current Outpatient Prescriptions  Medication Sig Dispense Refill  . acetaminophen (TYLENOL) 325 MG tablet Take by mouth.    Marland Kitchen acetaminophen (TYLENOL) 500 MG tablet Take by mouth.    Marland Kitchen albuterol (PROVENTIL HFA;VENTOLIN HFA) 108 (90 BASE) MCG/ACT inhaler Inhale 2 puffs into the lungs every 4 (four) hours as needed for wheezing or shortness of breath.    Marland Kitchen albuterol (VENTOLIN HFA) 108 (90 BASE) MCG/ACT inhaler Inhale 2 Inhalers into the lungs every 6 (six) hours as needed.    . ALPRAZolam (XANAX) 0.5 MG tablet Take 0.5 mg by mouth at bedtime as needed.    . celecoxib (CELEBREX) 200 MG capsule Take 200 mg by mouth 2 (two) times daily.    . cephALEXin (KEFLEX) 500 MG capsule Take 1 capsule (500 mg total) by mouth 4 (four) times daily. 40 capsule 0  . clopidogrel (PLAVIX) 75 MG tablet Take 75 mg by mouth.    . cyclobenzaprine (FLEXERIL) 5 MG tablet take 1 to 2 tablets by mouth every 8 hours if needed 30 tablet 0  . diphenoxylate-atropine (LOMOTIL) 2.5-0.025 MG per tablet Take by mouth.     . donepezil (ARICEPT) 10 MG tablet Take 10 mg by mouth at bedtime.    Marland Kitchen estropipate (OGEN) 0.75 MG tablet Take 0.75 mg by mouth daily.    . fentaNYL (DURAGESIC - DOSED MCG/HR) 50 MCG/HR Place 50 mcg onto the skin every 3 (three) days.    . Fluticasone-Salmeterol (ADVAIR) 250-50 MCG/DOSE AEPB Inhale 1 puff into the lungs 2 (two) times daily.    . furosemide (LASIX) 40 MG tablet Take 40 mg by mouth daily.    Marland Kitchen gabapentin (NEURONTIN) 800 MG tablet Take 800 mg by mouth.    Marland Kitchen GAVILYTE-N WITH FLAVOR PACK 420 G solution take by mouth as directed for COLONIC PREP  0  . lansoprazole (PREVACID) 30 MG capsule Take 1 capsule by mouth daily.    Marland Kitchen LYRICA 150 MG capsule   0  . magnesium oxide (MAG-OX) 400 MG tablet Take 400 mg by mouth.    . mupirocin ointment (BACTROBAN) 2 % Apply 1 application topically 3 (three) times daily.    . pantoprazole (PROTONIX) 40 MG tablet Take 1 tablet (40 mg total) by mouth 2 (two) times daily. 60 tablet 12  . pregabalin (LYRICA) 75 MG capsule Take 75 mg by mouth 2 (two) times daily.    . silver sulfADIAZINE (SILVADENE) 1 % cream Apply 1 application topically daily.    . simvastatin (ZOCOR) 10 MG tablet Take 10 mg by mouth daily.    Marland Kitchen  SUMAtriptan (IMITREX) 25 MG tablet Take 25 mg by mouth as needed for migraine. May repeat in 2 hours if headache persists or recurs.    Marland Kitchen tiotropium (SPIRIVA) 18 MCG inhalation capsule Place 18 mcg into inhaler and inhale daily.    . traMADol (ULTRAM) 50 MG tablet Take 50 mg by mouth 4 (four) times daily as needed.    . Vitamin D, Ergocalciferol, (DRISDOL) 50000 UNITS CAPS capsule Take by mouth.    Marland Kitchen acetaminophen (TYLENOL) 500 MG tablet Take 500 mg by mouth every 6 (six) hours as needed (2 tablets).     No current facility-administered medications for this visit.    Functional Status:  In your present state of health, do you have any difficulty performing the following activities: 05/01/2015 03/30/2015  Hearing? N N  Vision? N N  Difficulty  concentrating or making decisions? [name]  Walking or climbing stairs? Y Y  Dressing or bathing? N N  Doing errands, shopping? [name]  Preparing Food and eating ? Y Y  Using the Toilet? N N  In the past six months, have you accidently leaked urine? N Y  Do you have problems with loss of bowel control? N Y  Managing your Medications? Y Y  Managing your Finances? [name]  Housekeeping or managing your Housekeeping? Aggie Moats    Fall/Depression Screening: PHQ 2/9 Scores 07/13/2015 06/06/2015 05/01/2015 03/30/2015  PHQ - 2 Score 0 0 0 0   THN CM Care Plan Problem One        Patient Outreach Telephone from 07/13/2015 in [place] Problem One  Ineffective airway clearance related to Chronic obstructive pulmonary disease.     Care Plan for Problem One  Active   THN Long Term Goal (31-90 days)  Patient will be able to explain COPD zones on action plan within the next 90 days   THN Long Term Goal Start Date  07/13/15 [goal continued]   Interventions for Problem One Long Term Goal  Health Coach to send COPD information to patient that includes action plan.  Disucssed COPD zones with patient.  Discussed use of rescue inhaler.    THN CM Short Term Goal #1 (0-30 days)  Patient will be able to describe COPD green zone within 30 days.    THN CM Short Term Goal #1 Start Date  03/30/15   [facility] CM Short Term Goal #1 Met Date  05/01/15   Interventions for Short Term Goal #1  Discussed with patient green zone on action plan.  Action plan sent to patient   THN CM Short Term Goal #2 (0-30 days)  Patient will be able to describe techniques of controlling breath during episodes of shortness breath within 30 days.   THN CM Short Term Goal #2 Start Date  03/30/15   [facility] CM Short Term Goal #2 Met Date  05/01/15   Interventions for Short Term Goal #2  Discussed with patient pursed lipped breath. Discussed use of rescue inhaler.  Health Coach encouraged patient to use frequent rest periods to conserve energy.     THN CM Short Term Goal #3 (0-30 days)  Patient will be able to describe yellow zone within 30 days.   THN CM Short Term Goal #3 Start Date  07/13/15 [goal continued]   Interventions for Short Tern Goal #3  Reviewed COPD yellow and green zone with patient from the action plan.  Discussed with patient when to notify physician or go to emergency  room for further treatment. Reviewed use of rescue inhaler and oxygen.    [place] Plan Problem Two        Patient Outreach Telephone from 07/13/2015 in [place] Problem Two  Open wound to right leg/Cellulitis   Care Plan for Problem Two  Active   Interventions for Problem Two Long Term Goal   Discussed with patient signs and symptoms of infection.  Discussed antibiotic use and when to notify physician.   THN Long Term Goal (31-90) days  Patient wound to right leg will have no signs of infection within the next 90 days.   THN Long Term Goal Start Date  07/13/15       Assessment: Patient continues to benefit from health coach outreach.  Plan: RN Health Coach will contact patient within the next month and patient agrees to next call.     Jone Baseman, RN, MSN [place] [phone]

## 2015-07-17 DIAGNOSIS — G3184 Mild cognitive impairment, so stated: Secondary | ICD-10-CM | POA: Diagnosis not present

## 2015-07-17 DIAGNOSIS — R296 Repeated falls: Secondary | ICD-10-CM | POA: Diagnosis not present

## 2015-07-17 DIAGNOSIS — M542 Cervicalgia: Secondary | ICD-10-CM | POA: Diagnosis not present

## 2015-07-17 DIAGNOSIS — M791 Myalgia: Secondary | ICD-10-CM | POA: Diagnosis not present

## 2015-07-17 DIAGNOSIS — N390 Urinary tract infection, site not specified: Secondary | ICD-10-CM | POA: Diagnosis not present

## 2015-07-17 DIAGNOSIS — M4312 Spondylolisthesis, cervical region: Secondary | ICD-10-CM | POA: Diagnosis not present

## 2015-07-17 DIAGNOSIS — E538 Deficiency of other specified B group vitamins: Secondary | ICD-10-CM | POA: Diagnosis not present

## 2015-07-17 DIAGNOSIS — R55 Syncope and collapse: Secondary | ICD-10-CM | POA: Diagnosis not present

## 2015-07-17 DIAGNOSIS — J449 Chronic obstructive pulmonary disease, unspecified: Secondary | ICD-10-CM | POA: Diagnosis not present

## 2015-07-17 DIAGNOSIS — G609 Hereditary and idiopathic neuropathy, unspecified: Secondary | ICD-10-CM | POA: Diagnosis not present

## 2015-07-17 DIAGNOSIS — M961 Postlaminectomy syndrome, not elsewhere classified: Secondary | ICD-10-CM | POA: Diagnosis not present

## 2015-07-17 DIAGNOSIS — R634 Abnormal weight loss: Secondary | ICD-10-CM | POA: Diagnosis not present

## 2015-07-17 DIAGNOSIS — M47812 Spondylosis without myelopathy or radiculopathy, cervical region: Secondary | ICD-10-CM | POA: Diagnosis not present

## 2015-07-17 DIAGNOSIS — R110 Nausea: Secondary | ICD-10-CM | POA: Diagnosis not present

## 2015-07-18 DIAGNOSIS — I10 Essential (primary) hypertension: Secondary | ICD-10-CM | POA: Diagnosis not present

## 2015-07-18 DIAGNOSIS — I2510 Atherosclerotic heart disease of native coronary artery without angina pectoris: Secondary | ICD-10-CM | POA: Diagnosis not present

## 2015-07-18 DIAGNOSIS — K219 Gastro-esophageal reflux disease without esophagitis: Secondary | ICD-10-CM | POA: Diagnosis not present

## 2015-07-18 DIAGNOSIS — I739 Peripheral vascular disease, unspecified: Secondary | ICD-10-CM | POA: Diagnosis not present

## 2015-07-20 DIAGNOSIS — I10 Essential (primary) hypertension: Secondary | ICD-10-CM | POA: Diagnosis not present

## 2015-07-20 DIAGNOSIS — K219 Gastro-esophageal reflux disease without esophagitis: Secondary | ICD-10-CM | POA: Diagnosis not present

## 2015-07-20 DIAGNOSIS — I739 Peripheral vascular disease, unspecified: Secondary | ICD-10-CM | POA: Diagnosis not present

## 2015-07-20 DIAGNOSIS — I2510 Atherosclerotic heart disease of native coronary artery without angina pectoris: Secondary | ICD-10-CM | POA: Diagnosis not present

## 2015-07-24 DIAGNOSIS — I2510 Atherosclerotic heart disease of native coronary artery without angina pectoris: Secondary | ICD-10-CM | POA: Diagnosis not present

## 2015-07-24 DIAGNOSIS — J449 Chronic obstructive pulmonary disease, unspecified: Secondary | ICD-10-CM | POA: Diagnosis not present

## 2015-07-24 DIAGNOSIS — R29898 Other symptoms and signs involving the musculoskeletal system: Secondary | ICD-10-CM | POA: Diagnosis not present

## 2015-07-24 DIAGNOSIS — G629 Polyneuropathy, unspecified: Secondary | ICD-10-CM | POA: Diagnosis not present

## 2015-07-25 DIAGNOSIS — K219 Gastro-esophageal reflux disease without esophagitis: Secondary | ICD-10-CM | POA: Diagnosis not present

## 2015-07-25 DIAGNOSIS — I2510 Atherosclerotic heart disease of native coronary artery without angina pectoris: Secondary | ICD-10-CM | POA: Diagnosis not present

## 2015-07-25 DIAGNOSIS — I739 Peripheral vascular disease, unspecified: Secondary | ICD-10-CM | POA: Diagnosis not present

## 2015-07-25 DIAGNOSIS — I340 Nonrheumatic mitral (valve) insufficiency: Secondary | ICD-10-CM | POA: Diagnosis not present

## 2015-07-25 DIAGNOSIS — I10 Essential (primary) hypertension: Secondary | ICD-10-CM | POA: Diagnosis not present

## 2015-07-26 ENCOUNTER — Encounter: Payer: Self-pay | Admitting: *Deleted

## 2015-07-27 ENCOUNTER — Encounter: Payer: Self-pay | Admitting: Anesthesiology

## 2015-07-27 ENCOUNTER — Ambulatory Visit
Admission: RE | Admit: 2015-07-27 | Discharge: 2015-07-27 | Disposition: A | Discharge status: Home / Self Care | Payer: Commercial Managed Care - HMO | Source: Ambulatory Visit | Attending: Gastroenterology | Admitting: Gastroenterology

## 2015-07-27 ENCOUNTER — Ambulatory Visit: Payer: Commercial Managed Care - HMO | Admitting: Anesthesiology

## 2015-07-27 ENCOUNTER — Encounter
Admission: RE | Disposition: A | Discharge status: Home / Self Care | Payer: Self-pay | Source: Ambulatory Visit | Attending: Gastroenterology

## 2015-07-27 DIAGNOSIS — Z886 Allergy status to analgesic agent status: Secondary | ICD-10-CM | POA: Insufficient documentation

## 2015-07-27 DIAGNOSIS — D500 Iron deficiency anemia secondary to blood loss (chronic): Secondary | ICD-10-CM | POA: Diagnosis not present

## 2015-07-27 DIAGNOSIS — I2510 Atherosclerotic heart disease of native coronary artery without angina pectoris: Secondary | ICD-10-CM | POA: Insufficient documentation

## 2015-07-27 DIAGNOSIS — G629 Polyneuropathy, unspecified: Secondary | ICD-10-CM | POA: Insufficient documentation

## 2015-07-27 DIAGNOSIS — M1990 Unspecified osteoarthritis, unspecified site: Secondary | ICD-10-CM | POA: Diagnosis not present

## 2015-07-27 DIAGNOSIS — K5790 Diverticulosis of intestine, part unspecified, without perforation or abscess without bleeding: Secondary | ICD-10-CM | POA: Diagnosis not present

## 2015-07-27 DIAGNOSIS — Z90710 Acquired absence of both cervix and uterus: Secondary | ICD-10-CM | POA: Insufficient documentation

## 2015-07-27 DIAGNOSIS — K219 Gastro-esophageal reflux disease without esophagitis: Secondary | ICD-10-CM | POA: Insufficient documentation

## 2015-07-27 DIAGNOSIS — E559 Vitamin D deficiency, unspecified: Secondary | ICD-10-CM | POA: Diagnosis not present

## 2015-07-27 DIAGNOSIS — Z882 Allergy status to sulfonamides status: Secondary | ICD-10-CM | POA: Diagnosis not present

## 2015-07-27 DIAGNOSIS — J449 Chronic obstructive pulmonary disease, unspecified: Secondary | ICD-10-CM | POA: Diagnosis not present

## 2015-07-27 DIAGNOSIS — E785 Hyperlipidemia, unspecified: Secondary | ICD-10-CM | POA: Diagnosis not present

## 2015-07-27 DIAGNOSIS — I10 Essential (primary) hypertension: Secondary | ICD-10-CM | POA: Insufficient documentation

## 2015-07-27 DIAGNOSIS — I739 Peripheral vascular disease, unspecified: Secondary | ICD-10-CM | POA: Diagnosis not present

## 2015-07-27 DIAGNOSIS — Z885 Allergy status to narcotic agent status: Secondary | ICD-10-CM | POA: Diagnosis not present

## 2015-07-27 DIAGNOSIS — K5730 Diverticulosis of large intestine without perforation or abscess without bleeding: Secondary | ICD-10-CM | POA: Insufficient documentation

## 2015-07-27 DIAGNOSIS — R159 Full incontinence of feces: Secondary | ICD-10-CM | POA: Diagnosis not present

## 2015-07-27 DIAGNOSIS — F419 Anxiety disorder, unspecified: Secondary | ICD-10-CM | POA: Insufficient documentation

## 2015-07-27 HISTORY — PX: COLONOSCOPY WITH PROPOFOL: SHX5780

## 2015-07-27 HISTORY — PX: ESOPHAGOGASTRODUODENOSCOPY (EGD) WITH PROPOFOL: SHX5813

## 2015-07-27 LAB — HM COLONOSCOPY

## 2015-07-27 SURGERY — COLONOSCOPY WITH PROPOFOL
Anesthesia: General

## 2015-07-27 MED ORDER — LIDOCAINE HCL (CARDIAC) 20 MG/ML IV SOLN
INTRAVENOUS | Status: DC | PRN
  Administered 2015-07-27: 100 mg via INTRAVENOUS

## 2015-07-27 MED ORDER — SODIUM CHLORIDE 0.9 % IV SOLN: INTRAVENOUS | Status: DC

## 2015-07-27 MED ORDER — MIDAZOLAM HCL 2 MG/2ML IJ SOLN
INTRAMUSCULAR | Status: DC | PRN
  Administered 2015-07-27: 1 mg via INTRAVENOUS

## 2015-07-27 MED ORDER — PROPOFOL INFUSION 10 MG/ML OPTIME
INTRAVENOUS | Status: DC | PRN
  Administered 2015-07-27: 100 ug/kg/min via INTRAVENOUS

## 2015-07-27 MED ORDER — GLYCOPYRROLATE 0.2 MG/ML IJ SOLN
INTRAMUSCULAR | Status: DC | PRN
  Administered 2015-07-27: 0.2 mg via INTRAVENOUS

## 2015-07-27 MED ORDER — FENTANYL CITRATE (PF) 100 MCG/2ML IJ SOLN
INTRAMUSCULAR | Status: DC | PRN
  Administered 2015-07-27: 50 ug via INTRAVENOUS

## 2015-07-27 MED ORDER — PROPOFOL 10 MG/ML IV BOLUS
INTRAVENOUS | Status: DC | PRN
  Administered 2015-07-27: 30 mg via INTRAVENOUS

## 2015-07-27 MED ORDER — SODIUM CHLORIDE 0.9 % IV SOLN
INTRAVENOUS | Status: DC
  Administered 2015-07-27: 1000 mL via INTRAVENOUS

## 2015-07-27 NOTE — Op Note (Signed)
[facility]astroenterology Patient Name: Cheryl [name] Procedure Date: 07/27/2015 8:55 AM MRN: [id number] Account #: [id number] Date of Birth: 1951-06-17 Admit Type: Outpatient Age: 64 Room: [facility] ENDO ROOM 4 Gender: Female Note Status: Finalized Procedure:         Upper GI endoscopy Indications:       Iron deficiency anemia secondary to chronic blood loss Providers:         Ezzard Standing. Bluford Kaufmann, MD Referring MD:      Demetrios Isaacs. Sherrie Mustache, MD (Referring MD) Medicines:         Monitored Anesthesia Care Complications:     No immediate complications. Procedure:         Pre-Anesthesia Assessment:                    - Prior to the procedure, a History and Physical was                     performed, and patient medications, allergies and                     sensitivities were reviewed. The patient's tolerance of                     previous anesthesia was reviewed.                    - The risks and benefits of the procedure and the sedation                     options and risks were discussed with the patient. All                     questions were answered and informed consent was obtained.                    - After reviewing the risks and benefits, the patient was                     deemed in satisfactory condition to undergo the procedure.                    After obtaining informed consent, the endoscope was passed                     under direct vision. Throughout the procedure, the                     patient's blood pressure, pulse, and oxygen saturations                     were monitored continuously. The Endoscope was introduced                     through the mouth, and advanced to the second part of                     duodenum. The upper GI endoscopy was accomplished without                     difficulty. The patient tolerated the procedure well. The                     upper GI endoscopy was accomplished without difficulty.  The patient tolerated  the procedure well. Findings:      The examined esophagus was normal.      The entire examined stomach was normal.      The examined duodenum was normal. Impression:        - Normal esophagus.                    - Normal stomach.                    - Normal examined duodenum.                    - No specimens collected. Recommendation:    - Discharge patient to home.                    - Observe patient's clinical course.                    - The findings and recommendations were discussed with the                     patient. Procedure Code(s): --- Professional ---                    [phone], Esophagogastroduodenoscopy, flexible, transoral;                     diagnostic, including collection of specimen(s) by                     brushing or washing, when performed (separate procedure) Diagnosis Code(s): --- Professional ---                    D50.0, Iron deficiency anemia secondary to blood loss                     (chronic) CPT copyright 2014 American Medical Association. All rights reserved. The codes documented in this report are preliminary and upon coder review may  be revised to meet current compliance requirements. Wallace Cullens, MD 07/27/2015 9:10:38 AM This report has been signed electronically. Number of Addenda: 0 Note Initiated On: 07/27/2015 8:55 AM      [facility]

## 2015-07-27 NOTE — Transfer of Care (Signed)
Immediate Anesthesia Transfer of Care Note  Patient: Cheryl [name]  Procedure(s) Performed: Procedure(s): COLONOSCOPY WITH PROPOFOL (N/A) ESOPHAGOGASTRODUODENOSCOPY (EGD) WITH PROPOFOL (N/A)  Patient Location: PACU  Anesthesia Type:General  Level of Consciousness: sedated  Airway & Oxygen Therapy: Patient Spontanous Breathing and Patient connected to nasal cannula oxygen  Post-op Assessment: Report given to RN and Post -op Vital signs reviewed and stable  Post vital signs: Reviewed and stable  Last Vitals:  Filed Vitals:   07/27/15 0929  BP: 108/55  Pulse: 89  Temp: 35.8 C  Resp: 11    Complications: No apparent anesthesia complications

## 2015-07-27 NOTE — Anesthesia Postprocedure Evaluation (Signed)
  Anesthesia Post-op Note  Patient: Cheryl [name]  Procedure(s) Performed: Procedure(s): COLONOSCOPY WITH PROPOFOL (N/A) ESOPHAGOGASTRODUODENOSCOPY (EGD) WITH PROPOFOL (N/A)  Anesthesia type:General  Patient location: PACU  Post pain: Pain level controlled  Post assessment: Post-op Vital signs reviewed, Patient's Cardiovascular Status Stable, Respiratory Function Stable, Patent Airway and No signs of Nausea or vomiting  Post vital signs: Reviewed and stable  Last Vitals:  Filed Vitals:   07/27/15 1000  BP: 137/66  Pulse: 80  Temp:   Resp: 22    Level of consciousness: awake, alert  and patient cooperative  Complications: No apparent anesthesia complications

## 2015-07-27 NOTE — Anesthesia Procedure Notes (Signed)
Performed by: Travian Kerner Pre-anesthesia Checklist: Patient identified, Emergency Drugs available, Patient being monitored, Suction available and Timeout performed Patient Re-evaluated:Patient Re-evaluated prior to inductionOxygen Delivery Method: Nasal cannula Intubation Type: IV induction       

## 2015-07-27 NOTE — H&P (Signed)
Primary Care Physician:  Mila Merry, MD Primary Gastroenterologist:  Dr. Bluford Kaufmann  Pre-Procedure History & Physical: HPI:  Cheryl [name] is a 64 y.o. female is here for an EGD/colonoscopy.   Past Medical History  Diagnosis Date  . COPD (chronic obstructive pulmonary disease)   . Hypertension   . Arthritis   . Hyperlipidemia   . GERD (gastroesophageal reflux disease)   . Neuropathy 2010  . Oxygen deficiency   . Coronary artery disease   . Headache   . Anxiety   . Peripheral vascular disease   . Vitamin D deficiency     Past Surgical History  Procedure Laterality Date  . Appendectomy    . Spine surgery    . Foot surgery Bilateral     5-6 years per patient  . Cardiac catherization  10/31/2009  . Abdomnal aortic stent  05/30/2008    Dr. Nanetta Batty  . Abdominal hysterectomy  1975    Bilaterl Oophorectomy; Dur to IUD infection  . Cholecystectomy  1972  . Cervical fusion  C5 - 6/C6-7  . Appendectomy      Prior to Admission medications   Medication Sig Start Date End Date Taking? Authorizing Provider  acetaminophen (TYLENOL) 325 MG tablet Take by mouth.   Yes Historical Provider, MD  acetaminophen (TYLENOL) 500 MG tablet Take 500 mg by mouth every 6 (six) hours as needed (2 tablets).   Yes Historical Provider, MD  acetaminophen (TYLENOL) 500 MG tablet Take by mouth.   Yes Historical Provider, MD  albuterol (PROVENTIL HFA;VENTOLIN HFA) 108 (90 BASE) MCG/ACT inhaler Inhale 2 puffs into the lungs every 4 (four) hours as needed for wheezing or shortness of breath.   Yes Historical Provider, MD  celecoxib (CELEBREX) 200 MG capsule Take 200 mg by mouth 2 (two) times daily.   Yes Historical Provider, MD  clopidogrel (PLAVIX) 75 MG tablet Take 75 mg by mouth.   Yes Historical Provider, MD  cyclobenzaprine (FLEXERIL) 5 MG tablet take 1 to 2 tablets by mouth every 8 hours if needed 05/06/15  Yes Malva Limes, MD  diphenoxylate-atropine (LOMOTIL) 2.5-0.025 MG per tablet Take  by mouth.   Yes Historical Provider, MD  donepezil (ARICEPT) 10 MG tablet Take 10 mg by mouth at bedtime.   Yes Historical Provider, MD  estropipate (OGEN) 0.75 MG tablet Take 0.75 mg by mouth daily.   Yes Historical Provider, MD  Fluticasone-Salmeterol (ADVAIR) 250-50 MCG/DOSE AEPB Inhale 1 puff into the lungs 2 (two) times daily.   Yes Historical Provider, MD  furosemide (LASIX) 40 MG tablet Take 40 mg by mouth daily.   Yes Historical Provider, MD  gabapentin (NEURONTIN) 800 MG tablet Take 800 mg by mouth.   Yes Historical Provider, MD  GAVILYTE-N WITH FLAVOR PACK 420 G solution take by mouth as directed for COLONIC PREP 06/22/15  Yes Historical Provider, MD  lansoprazole (PREVACID) 30 MG capsule Take 1 capsule by mouth daily. 02/28/15  Yes Historical Provider, MD  LYRICA 150 MG capsule  06/24/15  Yes Historical Provider, MD  magnesium oxide (MAG-OX) 400 MG tablet Take 400 mg by mouth.   Yes Historical Provider, MD  mupirocin ointment (BACTROBAN) 2 % Apply 1 application topically 3 (three) times daily.   Yes Historical Provider, MD  pantoprazole (PROTONIX) 40 MG tablet Take 1 tablet (40 mg total) by mouth 2 (two) times daily. 05/24/15  Yes Malva Limes, MD  pregabalin (LYRICA) 75 MG capsule Take 75 mg by mouth 2 (two) times  daily.   Yes Historical Provider, MD  silver sulfADIAZINE (SILVADENE) 1 % cream Apply 1 application topically daily. 08/17/14  Yes Historical Provider, MD  simvastatin (ZOCOR) 10 MG tablet Take 10 mg by mouth daily.   Yes Historical Provider, MD  SUMAtriptan (IMITREX) 25 MG tablet Take 25 mg by mouth as needed for migraine. May repeat in 2 hours if headache persists or recurs.   Yes Historical Provider, MD  tiotropium (SPIRIVA) 18 MCG inhalation capsule Place 18 mcg into inhaler and inhale daily.   Yes Historical Provider, MD  traMADol (ULTRAM) 50 MG tablet Take 50 mg by mouth 4 (four) times daily as needed.   Yes Historical Provider, MD  albuterol (VENTOLIN HFA) 108 (90 BASE)  MCG/ACT inhaler Inhale 2 Inhalers into the lungs every 6 (six) hours as needed. 04/15/13   Historical Provider, MD  ALPRAZolam Prudy Feeler) 0.5 MG tablet Take 0.5 mg by mouth at bedtime as needed.    Historical Provider, MD  fentaNYL (DURAGESIC - DOSED MCG/HR) 50 MCG/HR Place 50 mcg onto the skin every 3 (three) days.    Historical Provider, MD  Vitamin D, Ergocalciferol, (DRISDOL) 50000 UNITS CAPS capsule Take by mouth.    Historical Provider, MD    Allergies as of 06/30/2015 - Review Complete 06/06/2015  Allergen Reaction Noted  . Aspirin  03/30/2015  . Codeine Other (See Comments) 03/30/2015  . Morphine and related  03/30/2015  . Percocet [oxycodone-acetaminophen]  03/30/2015  . Sulfa antibiotics  03/30/2015    Family History  Problem Relation Age of Onset  . Cancer Mother   . Cancer Father   . Diabetes Sister   . Cancer Brother   . Diabetes Brother     Social History   Social History  . Marital Status: Married    Spouse Name: N/A  . Number of Children: N/A  . Years of Education: N/A   Occupational History  . Not on file.   Social History Main Topics  . Smoking status: Not on file  . Smokeless tobacco: Not on file  . Alcohol Use: Not on file  . Drug Use: Not on file  . Sexual Activity: Not on file   Other Topics Concern  . Not on file   Social History Narrative    Review of Systems: See HPI, otherwise negative ROS  Physical Exam: BP 115/68 mmHg  Pulse 78  Temp(Src) 97.9 F (36.6 C) (Tympanic)  Resp 18  Ht 5\' 1"  (1.549 m)  Wt 45.36 kg (100 lb)  BMI 18.90 kg/m2  SpO2 100% General:   Alert,  pleasant and cooperative in NAD Head:  Normocephalic and atraumatic. Neck:  Supple; no masses or thyromegaly. Lungs:  Clear throughout to auscultation.    Heart:  Regular rate and rhythm. Abdomen:  Soft, nontender and nondistended. Normal bowel sounds, without guarding, and without rebound.   Neurologic:  Alert and  oriented x4;  grossly normal  neurologically.  Impression/Plan: Cheryl [name] is here for an EGD/colonoscopy to be performed for chronic blood loss and fecal incontinence.  Risks, benefits, limitations, and alternatives regarding  EGD/colonoscopy have been reviewed with the patient.  Questions have been answered.  All parties agreeable.   Campbell Agramonte, Ezzard Standing, MD  07/27/2015, 8:36 AM

## 2015-07-27 NOTE — Op Note (Signed)
[facility] Gastroenterology Patient Name: Cheryl [name] Procedure Date: 07/27/2015 8:54 AM MRN: [id number] Account #: [id number] Date of Birth: 1951-11-22 Admit Type: Outpatient Age: 64 Room: [facility] ENDO ROOM 4 Gender: Female Note Status: Finalized Procedure:         Colonoscopy Indications:       Iron deficiency anemia secondary to chronic blood loss,                     fecal incontinence Providers:         Ezzard Standing. Bluford Kaufmann, MD Referring MD:      Demetrios Isaacs. Sherrie Mustache, MD (Referring MD) Medicines:         Monitored Anesthesia [name] Complications:     No immediate complications. Procedure:         Pre-Anesthesia Assessment:                    - Prior to the procedure, a History and Physical was                     performed, and patient medications, allergies and                     sensitivities were reviewed. The patient's tolerance of                     previous anesthesia was reviewed.                    - The risks and benefits of the procedure and the sedation                     options and risks were discussed with the patient. All                     questions were answered and informed consent was obtained.                    - After reviewing the risks and benefits, the patient was                     deemed in satisfactory condition to undergo the procedure.                    After obtaining informed consent, the colonoscope was                     passed under direct vision. Throughout the procedure, the                     patient's blood pressure, pulse, and oxygen saturations                     were monitored continuously. The Colonoscope was                     introduced through the anus and advanced to the the cecum,                     identified by appendiceal orifice and ileocecal valve. The                     colonoscopy was performed without difficulty. The patient  tolerated the procedure well. The quality of the bowel          preparation was poor. Findings:      Multiple small and large-mouthed diverticula were found in the sigmoid       colon.      The exam was otherwise without abnormality. Impression:        - Preparation of the colon was poor.                    - Diverticulosis in the sigmoid colon.                    - The examination was otherwise normal.                    - No specimens collected. Recommendation:    - Discharge patient to home.                    - Repeat colonoscopy in 10 years for surveillance.                    - The findings and recommendations were discussed with the                     patient.                    - Fibercon 1 bid. If no relief, request solesta injection Procedure Code(s): --- Professional ---                    [phone], Colonoscopy, flexible; diagnostic, including                     collection of specimen(s) by brushing or washing, when                     performed (separate procedure) Diagnosis Code(s): --- Professional ---                    D50.0, Iron deficiency anemia secondary to blood loss                     (chronic)                    K57.30, Diverticulosis of large intestine without                     perforation or abscess without bleeding CPT copyright 2014 [employer]. All rights reserved. The codes documented in this report are preliminary and upon coder review may  be revised to meet current compliance requirements. Wallace Cullens, MD 07/27/2015 9:28:20 AM This report has been signed electronically. Number of Addenda: 0 Note Initiated On: 07/27/2015 8:54 AM Scope Withdrawal Time: 0 hours 6 minutes 46 seconds  Total Procedure Duration: 0 hours 12 minutes 43 seconds       [facility]

## 2015-07-27 NOTE — Anesthesia Preprocedure Evaluation (Signed)
Anesthesia Evaluation  Patient identified by MRN, date of birth, ID band Patient awake    Reviewed: Allergy & Precautions, H&P , NPO status , Patient's Chart, lab work & pertinent test results, reviewed documented beta blocker date and time   History of Anesthesia Complications Negative for: history of anesthetic complications  Airway Mallampati: III  TM Distance: >3 FB Neck ROM: limited    Dental no notable dental hx. (+) Edentulous Upper, Upper Dentures, Partial Lower, Poor Dentition   Pulmonary shortness of breath, asthma , neg sleep apnea, COPD COPD inhaler and oxygen dependent, neg recent URI, Current Smoker,  breath sounds clear to auscultation  + decreased breath sounds+ wheezing      Cardiovascular Exercise Tolerance: Good hypertension, - angina+ CAD and + Peripheral Vascular Disease - Past MI, - Cardiac Stents and - CABG Normal cardiovascular exam+ dysrhythmias Rhythm:regular Rate:Normal     Neuro/Psych  Headaches, PSYCHIATRIC DISORDERS (anxiety)  Neuromuscular disease (neuropathy)    GI/Hepatic Neg liver ROS, GERD-  Medicated and Controlled,  Endo/Other  negative endocrine ROS  Renal/GU negative Renal ROS  negative genitourinary   Musculoskeletal   Abdominal   Peds  Hematology  (+) Blood dyscrasia, anemia ,   Anesthesia Other Findings Past Medical History:   COPD (chronic obstructive pulmonary disease)                 Hypertension                                                 Arthritis                                                    Hyperlipidemia                                               GERD (gastroesophageal reflux disease)                       Neuropathy                                      2010         Oxygen deficiency                                            Coronary artery disease                                      Headache                                                     Anxiety  Peripheral vascular disease                                  Vitamin D deficiency                                         Reproductive/Obstetrics negative OB ROS                             Anesthesia Physical Anesthesia Plan  ASA: III  Anesthesia Plan: General   Post-op Pain Management:    Induction:   Airway Management Planned:   Additional Equipment:   Intra-op Plan:   Post-operative Plan:   Informed Consent: I have reviewed the patients History and Physical, chart, labs and discussed the procedure including the risks, benefits and alternatives for the proposed anesthesia with the patient or authorized representative who has indicated his/her understanding and acceptance.   Dental Advisory Given  Plan Discussed with: Anesthesiologist, CRNA and Surgeon  Anesthesia Plan Comments:         Anesthesia Quick Evaluation

## 2015-07-31 ENCOUNTER — Emergency Department: Payer: Commercial Managed Care - HMO

## 2015-07-31 ENCOUNTER — Encounter: Payer: Self-pay | Admitting: Emergency Medicine

## 2015-07-31 ENCOUNTER — Observation Stay
Admission: EM | Admit: 2015-07-31 | Discharge: 2015-08-03 | Disposition: A | Discharge status: Home / Self Care | Payer: Commercial Managed Care - HMO | Attending: Internal Medicine | Admitting: Internal Medicine

## 2015-07-31 DIAGNOSIS — R410 Disorientation, unspecified: Secondary | ICD-10-CM

## 2015-07-31 DIAGNOSIS — F17210 Nicotine dependence, cigarettes, uncomplicated: Secondary | ICD-10-CM | POA: Diagnosis not present

## 2015-07-31 DIAGNOSIS — F419 Anxiety disorder, unspecified: Secondary | ICD-10-CM | POA: Insufficient documentation

## 2015-07-31 DIAGNOSIS — E785 Hyperlipidemia, unspecified: Secondary | ICD-10-CM | POA: Diagnosis not present

## 2015-07-31 DIAGNOSIS — I10 Essential (primary) hypertension: Secondary | ICD-10-CM | POA: Diagnosis not present

## 2015-07-31 DIAGNOSIS — R61 Generalized hyperhidrosis: Secondary | ICD-10-CM | POA: Diagnosis not present

## 2015-07-31 DIAGNOSIS — J069 Acute upper respiratory infection, unspecified: Secondary | ICD-10-CM | POA: Diagnosis present

## 2015-07-31 DIAGNOSIS — R531 Weakness: Secondary | ICD-10-CM | POA: Diagnosis not present

## 2015-07-31 DIAGNOSIS — M1990 Unspecified osteoarthritis, unspecified site: Secondary | ICD-10-CM | POA: Insufficient documentation

## 2015-07-31 DIAGNOSIS — Z79899 Other long term (current) drug therapy: Secondary | ICD-10-CM | POA: Diagnosis not present

## 2015-07-31 DIAGNOSIS — E559 Vitamin D deficiency, unspecified: Secondary | ICD-10-CM | POA: Diagnosis not present

## 2015-07-31 DIAGNOSIS — G0430 Acute necrotizing hemorrhagic encephalopathy, unspecified: Secondary | ICD-10-CM | POA: Diagnosis not present

## 2015-07-31 DIAGNOSIS — G43909 Migraine, unspecified, not intractable, without status migrainosus: Secondary | ICD-10-CM | POA: Insufficient documentation

## 2015-07-31 DIAGNOSIS — G9341 Metabolic encephalopathy: Principal | ICD-10-CM | POA: Insufficient documentation

## 2015-07-31 DIAGNOSIS — J449 Chronic obstructive pulmonary disease, unspecified: Secondary | ICD-10-CM | POA: Insufficient documentation

## 2015-07-31 DIAGNOSIS — I2510 Atherosclerotic heart disease of native coronary artery without angina pectoris: Secondary | ICD-10-CM | POA: Diagnosis not present

## 2015-07-31 DIAGNOSIS — M810 Age-related osteoporosis without current pathological fracture: Secondary | ICD-10-CM | POA: Diagnosis not present

## 2015-07-31 DIAGNOSIS — K219 Gastro-esophageal reflux disease without esophagitis: Secondary | ICD-10-CM | POA: Diagnosis not present

## 2015-07-31 DIAGNOSIS — R198 Other specified symptoms and signs involving the digestive system and abdomen: Secondary | ICD-10-CM | POA: Diagnosis present

## 2015-07-31 DIAGNOSIS — I739 Peripheral vascular disease, unspecified: Secondary | ICD-10-CM | POA: Diagnosis not present

## 2015-07-31 DIAGNOSIS — G9340 Encephalopathy, unspecified: Secondary | ICD-10-CM | POA: Diagnosis not present

## 2015-07-31 DIAGNOSIS — F0390 Unspecified dementia without behavioral disturbance: Secondary | ICD-10-CM | POA: Diagnosis present

## 2015-07-31 DIAGNOSIS — J441 Chronic obstructive pulmonary disease with (acute) exacerbation: Secondary | ICD-10-CM | POA: Diagnosis present

## 2015-07-31 LAB — CBC
HEMATOCRIT: 35 % (ref 35.0–47.0)
Hemoglobin: 11.9 g/dL — ABNORMAL LOW (ref 12.0–16.0)
MCH: 31.5 pg (ref 26.0–34.0)
MCHC: 33.9 g/dL (ref 32.0–36.0)
MCV: 92.7 fL (ref 80.0–100.0)
PLATELETS: 249 10*3/uL (ref 150–440)
RBC: 3.77 MIL/uL — AB (ref 3.80–5.20)
RDW: 13.8 % (ref 11.5–14.5)
WBC: 5.8 10*3/uL (ref 3.6–11.0)

## 2015-07-31 LAB — URINALYSIS COMPLETE WITH MICROSCOPIC (ARMC ONLY)
Bacteria, UA: NONE SEEN
Bilirubin Urine: NEGATIVE
Glucose, UA: NEGATIVE mg/dL
Leukocytes, UA: NEGATIVE
Nitrite: NEGATIVE
Protein, ur: NEGATIVE mg/dL
Specific Gravity, Urine: 1.023 (ref 1.005–1.030)
pH: 5 (ref 5.0–8.0)

## 2015-07-31 LAB — COMPREHENSIVE METABOLIC PANEL
ALT: 11 U/L — AB (ref 14–54)
AST: 20 U/L (ref 15–41)
Albumin: 4 g/dL (ref 3.5–5.0)
Alkaline Phosphatase: 111 U/L (ref 38–126)
Anion gap: 10 (ref 5–15)
BILIRUBIN TOTAL: 0.5 mg/dL (ref 0.3–1.2)
BUN: 14 mg/dL (ref 6–20)
CALCIUM: 9.1 mg/dL (ref 8.9–10.3)
CHLORIDE: 104 mmol/L (ref 101–111)
CO2: 24 mmol/L (ref 22–32)
Creatinine, Ser: 0.85 mg/dL (ref 0.44–1.00)
GLUCOSE: 105 mg/dL — AB (ref 65–99)
Potassium: 3.9 mmol/L (ref 3.5–5.1)
Sodium: 138 mmol/L (ref 135–145)
Total Protein: 7.3 g/dL (ref 6.5–8.1)

## 2015-07-31 LAB — AMMONIA: Ammonia: 16 umol/L (ref 9–35)

## 2015-07-31 LAB — TROPONIN I: Troponin I: <0.03 ng/mL

## 2015-07-31 MED ORDER — SODIUM CHLORIDE 0.9 % IV BOLUS (SEPSIS)
1000.0000 mL | Freq: Once | INTRAVENOUS | Status: AC
  Administered 2015-07-31: 1000 mL via INTRAVENOUS

## 2015-07-31 NOTE — ED Provider Notes (Signed)
[facility] Emerge ncy Department Provider Note REMINDER - THIS NOTE IS NOT A FINAL MEDICAL RECORD UNTIL IT IS SIGNED. UNTIL THEN, THE CONTENT BELOW MAY REFLECT INFORMATION FROM A DOCUMENTATION TEMPLATE, NOT THE ACTUAL PATIENT VISIT. ____________________________________________  Time seen: Approximately 3:55 PM  I have reviewed the triage vital signs and the nursing notes.   HISTORY  Chief Complaint URI    HPI Sahana Boyland is a 64 y.o. female the history of COPD, urinary tract infections, altered mental status. [place], therefore she didn't feel well last night, she was very sweaty. Then this morning she got up and seemed confused, stated she wanted to go home right away and began going to the truck without prompting. Does reports she's been confused, slowly worsening in her mental status, now refusing to get out of bed.  Patient denies any pain. She does appear somewhat confused, but no pain.  Past Medical History  Diagnosis Date  . COPD (chronic obstructive pulmonary disease)   . Hypertension   . Arthritis   . Hyperlipidemia   . GERD (gastroesophageal reflux disease)   . Neuropathy 2010  . Oxygen deficiency   . Coronary artery disease   . Headache   . Anxiety   . Peripheral vascular disease   . Vitamin D deficiency     Patient Active Problem List   Diagnosis Date Noted  . Cellulitis of leg 07/09/2015  . BP (high blood pressure) 07/03/2015  . Absolute anemia 07/03/2015  . Airway hyperreactivity 07/03/2015  . Back pain, thoracic 07/03/2015  . Chronic LBP 07/03/2015  . Diaphoresis 07/03/2015  . Bleeding from the nose 07/03/2015  . Excessive falling 07/03/2015  . Alteration in bowel elimination: incontinence 07/03/2015  . Hematoma 07/03/2015  . H/O gastrointestinal disease 07/03/2015  . Cannot sleep 07/03/2015  . Decreased testosterone level 07/03/2015  . Leg weakness 07/03/2015  . Menopausal symptom  07/03/2015  . Migraine 07/03/2015  . Neuropathy 07/03/2015  . Fecal occult blood test positive 07/03/2015  . OP (osteoporosis) 07/03/2015  . Episodic paroxysmal anxiety disorder 07/03/2015  . Inflammation of sacroiliac joint 07/03/2015  . Breathlessness on exertion 07/03/2015  . Accumulation of fluid in tissues 07/03/2015  . Episode of syncope 07/03/2015  . Compulsive tobacco user syndrome 07/03/2015  . Absence of bladder continence 07/03/2015  . Weight loss 07/03/2015  . COPD exacerbation 06/06/2015  . Essential hypertension 06/06/2015  . GERD (gastroesophageal reflux disease) 06/06/2015  . Hyperlipemia 06/06/2015  . Mild cognitive disorder 04/13/2014  . Disorder of peripheral nervous system 04/13/2014  . Anxiety 02/10/2014  . Arteriosclerosis of coronary artery 02/10/2014  . Peripheral blood vessel disorder 02/10/2014  . Avitaminosis D 10/16/2009    Past Surgical History  Procedure Laterality Date  . Appendectomy    . Spine surgery    . Foot surgery Bilateral     5-6 years per patient  . Cardiac catherization  10/31/2009  . Abdomnal aortic stent  05/30/2008    Dr. Nanetta Batty  . Abdominal hysterectomy  1975    Bilaterl Oophorectomy; Dur to IUD infection  . Cholecystectomy  1972  . Cervical fusion  C5 - 6/C6-7  . Appendectomy    . Colonoscopy with propofol N/A 07/27/2015    Procedure: COLONOSCOPY WITH PROPOFOL;  Surgeon: Wallace Cullens, MD;  Location: [facility] ENDOSCOPY;  Service: Gastroenterology;  Laterality: N/A;  . Esophagogastroduodenoscopy (egd) with propofol N/A 07/27/2015    Procedure: ESOPHAGOGASTRODUODENOSCOPY (EGD) WITH PROPOFOL;  Surgeon:  Wallace Cullens, MD;  Location: [facility] ENDOSCOPY;  Service: Gastroenterology;  Laterality: N/A;    Current Outpatient Rx  Name  Route  Sig  Dispense  Refill  . acetaminophen (TYLENOL) 500 MG tablet   Oral   Take 1,000 mg by mouth every 6 (six) hours as needed for mild pain or headache.          . albuterol (PROVENTIL HFA;VENTOLIN HFA)  108 (90 BASE) MCG/ACT inhaler   Inhalation   Inhale 2 puffs into the lungs every 4 (four) hours as needed for wheezing or shortness of breath.         . ALPRAZolam (XANAX) 0.5 MG tablet   Oral   Take 0.5 mg by mouth daily as needed for anxiety.          . celecoxib (CELEBREX) 200 MG capsule   Oral   Take 200 mg by mouth daily.          . clopidogrel (PLAVIX) 75 MG tablet   Oral   Take 75 mg by mouth daily.         . cyclobenzaprine (FLEXERIL) 5 MG tablet   Oral   Take 5-10 mg by mouth every 8 (eight) hours as needed for muscle spasms.         . diphenoxylate-atropine (LOMOTIL) 2.5-0.025 MG per tablet   Oral   Take 1 tablet by mouth 2 (two) times daily.         Marland Kitchen donepezil (ARICEPT) 10 MG tablet   Oral   Take 10 mg by mouth at bedtime.         Marland Kitchen estropipate (OGEN) 0.75 MG tablet   Oral   Take 0.75 mg by mouth daily.         . fentaNYL (DURAGESIC - DOSED MCG/HR) 50 MCG/HR   Transdermal   Place 50 mcg onto the skin every 3 (three) days.         . Fluticasone-Salmeterol (ADVAIR) 250-50 MCG/DOSE AEPB   Inhalation   Inhale 1 puff into the lungs 2 (two) times daily.         . furosemide (LASIX) 40 MG tablet   Oral   Take 40 mg by mouth daily.         . lansoprazole (PREVACID) 30 MG capsule   Oral   Take 30 mg by mouth 2 (two) times daily.         . mupirocin cream (BACTROBAN) 2 %   Topical   Apply 1 application topically 3 (three) times daily.         . pregabalin (LYRICA) 150 MG capsule   Oral   Take 150 mg by mouth 2 (two) times daily.         . simvastatin (ZOCOR) 10 MG tablet   Oral   Take 10 mg by mouth at bedtime.          . SUMAtriptan (IMITREX) 25 MG tablet   Oral   Take 25 mg by mouth as needed for migraine. May repeat in 2 hours if headache persists or recurs.         Marland Kitchen tiotropium (SPIRIVA) 18 MCG inhalation capsule   Inhalation   Place 18 mcg into inhaler and inhale daily.         . traMADol (ULTRAM) 50 MG  tablet   Oral   Take 100 mg by mouth 4 (four) times daily.          . pantoprazole (PROTONIX) 40 MG tablet  Oral   Take 1 tablet (40 mg total) by mouth 2 (two) times daily. Patient not taking: Reported on 07/31/2015   60 tablet   12     Allergies Percocet; Aspirin; Codeine; Morphine; Pregabalin; Propoxyphene; and Sulfa antibiotics  Family History  Problem Relation Age of Onset  . Cancer Mother   . Cancer Father   . Diabetes Sister   . Cancer Brother   . Diabetes Brother     Social History Social History  Substance Use Topics  . Smoking status: Current Every Day Smoker -- 1.00 packs/day    Types: Cigarettes  . Smokeless tobacco: None  . Alcohol Use: No    Review of Systems Constitutional: No fever/chills was very sweaty last night Eyes: No visual changes. ENT: No sore throat. Cardiovascular: Denies chest pain. Respiratory: Denies shortness of breath. Gastrointestinal: No abdominal pain.  No nausea, no vomiting.  No diarrhea.  No constipation. Genitourinary: Negative for dysuria. Musculoskeletal: Negative for back pain. Skin: Negative for rash. Neurological: Negative for headaches, focal weakness or numbness. Patient has been increasingly confused. She walked to weakness.  10-point ROS otherwise negative.  ____________________________________________   PHYSICAL EXAM:  VITAL SIGNS: ED Triage Vitals  Enc Vitals Group     BP 07/31/15 1121 146/59 mmHg     Pulse Rate 07/31/15 1121 66     Resp 07/31/15 1121 18     Temp 07/31/15 1121 97.8 F (36.6 C)     Temp Source 07/31/15 1121 Oral     SpO2 07/31/15 1121 94 %     Weight 07/31/15 1121 99 lb (44.906 kg)     Height 07/31/15 1121 5' (1.524 m)     Head Cir --      Peak Flow --      Pain Score --      Pain Loc --      Pain Edu? --      Excl. in GC? --    Constitutional: Somnolent and , laying on her side curled up in a ball. Appears very fatigued.  Eyes: Conjunctivae are normal. PERRL. EOMI. Head:  Atraumatic. Nose: No congestion/rhinnorhea. Mouth/Throat: Mucous membranes are dry.  Oropharynx non-erythematous. Neck: No stridor.   Cardiovascular: Normal rate, regular rhythm. Grossly normal heart sounds.  Good peripheral circulation. Respiratory: Slightly shallow respiratory effort.  No retractions. Lungs CTAB. Gastrointestinal: Soft and nontender. No distention. No abdominal bruits. No CVA tenderness. Musculoskeletal: No lower extremity tenderness nor edema.  No joint effusions. Neurologic:  Normal speech and language. No gross focal neurologic deficits are appreciated. Skin:  Skin is warm, dry and intact. No rash noted. Psychiatric: Mood and affect are normal. Speech and behavior are normal.  ____________________________________________   LABS (all labs ordered are listed, but only abnormal results are displayed)  Labs Reviewed  CBC - Abnormal; Notable for the following:    RBC 3.77 (*)    Hemoglobin 11.9 (*)    All other components within normal limits  COMPREHENSIVE METABOLIC PANEL - Abnormal; Notable for the following:    Glucose, Bld 105 (*)    ALT 11 (*)    All other components within normal limits  URINALYSIS COMPLETEWITH MICROSCOPIC ([facility] ONLY) - Abnormal; Notable for the following:    Color, Urine YELLOW (*)    APPearance CLEAR (*)    Ketones, ur 1+ (*)    Hgb urine dipstick 1+ (*)    Squamous Epithelial / LPF 0-5 (*)    All other components within normal limits  TROPONIN  I  BLOOD GAS, VENOUS  AMMONIA   ____________________________________________  EKG  Reviewed. By me Normal sinus rhythm, ventricular rate 60 Nonspecific T-wave abnormality noted in lateral leads and slight downward depression of the sixth  Intervals otherwise normal ____________________________________________  RADIOLOGY    CT Head Wo Contrast (Final result) Result time: 07/31/15 16:35:15   Final result by Rad Results In Interface (07/31/15 16:35:15)   Narrative:   CLINICAL  DATA: Confusion and weakness  EXAM: CT HEAD WITHOUT CONTRAST  TECHNIQUE: Contiguous axial images were obtained from the base of the skull through the vertex without intravenous contrast.  COMPARISON: CT head 02/03/2015  FINDINGS: Ventricle size normal. Cerebral volume normal.  Patchy hypodensity in the cerebral white matter bilaterally is stable and consistent with chronic microvascular ischemia.  Negative for acute infarct. Negative for hemorrhage or mass.  Negative for skull fracture.  IMPRESSION: Chronic microvascular ischemic change in the white matter. No acute abnormality.    CT Head Wo Contrast (Final result) Result time: 07/31/15 16:35:15   Final result by Rad Results In Interface (07/31/15 16:35:15)   Narrative:   CLINICAL DATA: Confusion and weakness  EXAM: CT HEAD WITHOUT CONTRAST  TECHNIQUE: Contiguous axial images were obtained from the base of the skull through the vertex without intravenous contrast.  COMPARISON: CT head 02/03/2015  FINDINGS: Ventricle size normal. Cerebral volume normal.  Patchy hypodensity in the cerebral white matter bilaterally is stable and consistent with chronic microvascular ischemia.  Negative for acute infarct. Negative for hemorrhage or mass.  Negative for skull fracture.  IMPRESSION: Chronic microvascular ischemic change in the white matter. No acute abnormality.   Electronically Signed By: Marlan Palau M.D. On: 07/31/2015 16:35          DG Chest Port 1 View (Final result) Result time: 07/31/15 16:49:02   Final result by Rad Results In Interface (07/31/15 16:49:02)   Narrative:   CLINICAL DATA: [employer]. Sick feeling Initial encounter  EXAM: PORTABLE CHEST - 1 VIEW  COMPARISON: 03/01/2015  FINDINGS: Normal cardiac silhouette. Lungs are hyperinflated. No effusion, infiltrate, or pneumothorax.  IMPRESSION: Hyperinflated lungs without acute findings.      ____________________________________________   PROCEDURES  Procedure(s) performed: None  Critical Care performed: No  ____________________________________________   INITIAL IMPRESSION / ASSESSMENT AND PLAN / ED COURSE  Pertinent labs & imaging results that were available during my care of the patient were reviewed by me and considered in my medical decision making (see chart for details).  Patient presents with somewhat subacute alteration in mental status over the last day. She is a history of this in the past, previously unknown etiology though urinary tract infection was suspected once on last admission. She currently has no focal neurologic deficits, no acute cardio pulmonary symptoms. Afebrile. She appears eminently stable, but she is quite somnolent.  Clinical data the patient for picture of altered mental status with unknown acute cause. Could be due to underlying dementia, though I am not sure. The patient is unable to sit up on her own, she is unable ambulate, she is quite somnolent. Do not believe she would be safe to be at home.  Admit for further workup. ____________________________________________   FINAL CLINICAL IMPRESSION(S) / ED DIAGNOSES  Final diagnoses:  Encephalopathy acute  Confusion      Sharyn Creamer, MD 07/31/15 2018

## 2015-07-31 NOTE — ED Notes (Signed)
Patient would not cooperate with respiratory therapist in obtaining peak flow. Patient would not change position from being curled up with her head under the coverings. She told this Clinical research associate that she was ready to go home. Family states the patient is the one who wanted to come here and has been in the waiting room since 11:33.

## 2015-07-31 NOTE — ED Notes (Signed)
Pts husband states she woke up yesterday stating she wasn't feeling good, over night, was soaking wet from sweat, now in triage, c/o being cold and "not feeling well," Pt has multiple medical issues in her hx.

## 2015-07-31 NOTE — H&P (Signed)
[facility] - [place] at [facility]   PATIENT NAME: Cheryl [name]    MR#:  [id number]  DATE OF BIRTH:  1951-07-06  DATE OF ADMISSION:  07/31/2015  PRIMARY CARE PHYSICIAN: Mila Merry, MD   REQUESTING/REFERRING PHYSICIAN: Fanny Bien, MD  CHIEF COMPLAINT:   Chief Complaint  Patient presents with  . URI    HISTORY OF PRESENT ILLNESS:  Cheryl [name]  is a 64 y.o. female who presents with acute encephalopathy. Patient is unable to contribute to her history, and so information is taken from her husband and daughter who are present at the bedside. They stated the patient has had episodes similar to this in the past where she will become acutely encephalopathic for anywhere from 2-5 days and prior workups, which have been extensive, have never elucidated a clear cause. Patient has had no documented fevers, no other true infectious symptoms except for some mild complaint yesterday of GI upset without diarrhea or vomiting. Patient was "sweaty" earlier this morning. She recently just had cardiac and GI workups including a negative stress test and a benign EGD. Her prior workups for these encephalopathic episodes in the past have included MRI and lumbar puncture, all which have returned without any explanation for symptoms.  On evaluation in our ED today the patient's labs are within normal limits, CT head shows chronic white matter changes but no acute abnormality. Patient's vital signs are within normal limits. Patient does have a history of dementia, though she seems to be very functional at baseline. Hospitalists were called for admission for further workup of her acute encephalopathy.  PAST MEDICAL HISTORY:   Past Medical History  Diagnosis Date  . COPD (chronic obstructive pulmonary disease)   . Hypertension   . Arthritis   . Hyperlipidemia   . GERD (gastroesophageal reflux disease)   . Neuropathy 2010  . Oxygen deficiency   . Coronary artery disease   . Headache   .  Anxiety   . Peripheral vascular disease   . Vitamin D deficiency     PAST SURGICAL HISTORY:   Past Surgical History  Procedure Laterality Date  . Appendectomy    . Spine surgery    . Foot surgery Bilateral     5-6 years per patient  . Cardiac catherization  10/31/2009  . Abdomnal aortic stent  05/30/2008    Dr. Nanetta Batty  . Abdominal hysterectomy  1975    Bilaterl Oophorectomy; Dur to IUD infection  . Cholecystectomy  1972  . Cervical fusion  C5 - 6/C6-7  . Appendectomy    . Colonoscopy with propofol N/A 07/27/2015    Procedure: COLONOSCOPY WITH PROPOFOL;  Surgeon: Wallace Cullens, MD;  Location: [facility] ENDOSCOPY;  Service: Gastroenterology;  Laterality: N/A;  . Esophagogastroduodenoscopy (egd) with propofol N/A 07/27/2015    Procedure: ESOPHAGOGASTRODUODENOSCOPY (EGD) WITH PROPOFOL;  Surgeon: Wallace Cullens, MD;  Location: [facility] ENDOSCOPY;  Service: Gastroenterology;  Laterality: N/A;    SOCIAL HISTORY:   Social History  Substance Use Topics  . Smoking status: Current Every Day Smoker -- 1.00 packs/day    Types: Cigarettes  . Smokeless tobacco: Not on file  . Alcohol Use: No    FAMILY HISTORY:   Family History  Problem Relation Age of Onset  . Cancer Mother   . Cancer Father   . Diabetes Sister   . Cancer Brother   . Diabetes Brother     DRUG ALLERGIES:   Allergies  Allergen Reactions  . Percocet [Oxycodone-Acetaminophen] Hives  .  Aspirin Nausea Only and Other (See Comments)    Reaction:  GI upset   . Codeine Nausea Only and Other (See Comments)    Reaction:  GI upset   . Morphine Other (See Comments)    Reaction:  Unknown   . Pregabalin Other (See Comments)    Reaction:  Makes pt sleep excessively.    . Propoxyphene Other (See Comments)    Reaction:  GI upset   . Sulfa Antibiotics Rash and Other (See Comments)    Reaction:  GI upset     MEDICATIONS AT HOME:   Prior to Admission medications   Medication Sig Start Date End Date Taking? Authorizing Provider   acetaminophen (TYLENOL) 500 MG tablet Take 1,000 mg by mouth every 6 (six) hours as needed for mild pain or headache.    Yes Historical Provider, MD  albuterol (PROVENTIL HFA;VENTOLIN HFA) 108 (90 BASE) MCG/ACT inhaler Inhale 2 puffs into the lungs every 4 (four) hours as needed for wheezing or shortness of breath.   Yes Historical Provider, MD  ALPRAZolam Prudy Feeler) 0.5 MG tablet Take 0.5 mg by mouth daily as needed for anxiety.    Yes Historical Provider, MD  celecoxib (CELEBREX) 200 MG capsule Take 200 mg by mouth daily.    Yes Historical Provider, MD  clopidogrel (PLAVIX) 75 MG tablet Take 75 mg by mouth daily.   Yes Historical Provider, MD  cyclobenzaprine (FLEXERIL) 5 MG tablet Take 5-10 mg by mouth every 8 (eight) hours as needed for muscle spasms.   Yes Historical Provider, MD  diphenoxylate-atropine (LOMOTIL) 2.5-0.025 MG per tablet Take 1 tablet by mouth 2 (two) times daily.   Yes Historical Provider, MD  donepezil (ARICEPT) 10 MG tablet Take 10 mg by mouth at bedtime.   Yes Historical Provider, MD  estropipate (OGEN) 0.75 MG tablet Take 0.75 mg by mouth daily.   Yes Historical Provider, MD  fentaNYL (DURAGESIC - DOSED MCG/HR) 50 MCG/HR Place 50 mcg onto the skin every 3 (three) days.   Yes Historical Provider, MD  Fluticasone-Salmeterol (ADVAIR) 250-50 MCG/DOSE AEPB Inhale 1 puff into the lungs 2 (two) times daily.   Yes Historical Provider, MD  furosemide (LASIX) 40 MG tablet Take 40 mg by mouth daily.   Yes Historical Provider, MD  lansoprazole (PREVACID) 30 MG capsule Take 30 mg by mouth 2 (two) times daily.   Yes Historical Provider, MD  mupirocin cream (BACTROBAN) 2 % Apply 1 application topically 3 (three) times daily.   Yes Historical Provider, MD  pregabalin (LYRICA) 150 MG capsule Take 150 mg by mouth 2 (two) times daily.   Yes Historical Provider, MD  simvastatin (ZOCOR) 10 MG tablet Take 10 mg by mouth at bedtime.    Yes Historical Provider, MD  SUMAtriptan (IMITREX) 25 MG tablet  Take 25 mg by mouth as needed for migraine. May repeat in 2 hours if headache persists or recurs.   Yes Historical Provider, MD  tiotropium (SPIRIVA) 18 MCG inhalation capsule Place 18 mcg into inhaler and inhale daily.   Yes Historical Provider, MD  traMADol (ULTRAM) 50 MG tablet Take 100 mg by mouth 4 (four) times daily.    Yes Historical Provider, MD    REVIEW OF SYSTEMS:  Review of Systems  Unable to perform ROS: acuity of condition     VITAL SIGNS:   Filed Vitals:   07/31/15 1930 07/31/15 2000 07/31/15 2030 07/31/15 2100  BP: 134/59 130/66 140/64 130/55  Pulse: 73 82 77 65  Temp:  TempSrc:      Resp:      Height:      Weight:      SpO2: 99% 99% 100% 100%   Wt Readings from Last 3 Encounters:  07/31/15 44.906 kg (99 lb)  07/27/15 45.36 kg (100 lb)  07/03/15 45.36 kg (100 lb)    PHYSICAL EXAMINATION:  Physical Exam  Vitals reviewed. Constitutional: She appears well-developed and well-nourished. No distress.  HENT:  Head: Normocephalic and atraumatic.  Mouth/Throat: Oropharynx is clear and moist.  Eyes: Conjunctivae and EOM are normal. Pupils are equal, round, and reactive to light. No scleral icterus.  Neck: Normal range of motion. Neck supple. No JVD present. No thyromegaly present.  Cardiovascular: Normal rate, regular rhythm and intact distal pulses.  Exam reveals no gallop and no friction rub.   No murmur heard. Respiratory: Effort normal and breath sounds normal. No respiratory distress. She has no wheezes. She has no rales.  GI: Soft. Bowel sounds are normal. She exhibits no distension. There is no tenderness.  Musculoskeletal: Normal range of motion. She exhibits no edema.  No arthritis, no gout  Lymphadenopathy:    She has no cervical adenopathy.  Neurological:  Unable to fully assess this patient only moderately follows commands and is not truly verbalizing at this time.  Skin: Skin is warm and dry. No rash noted. No erythema.  Psychiatric:  Unable to  assess due to patient's condition.    LABORATORY PANEL:   CBC  Recent Labs Lab 07/31/15 1145  WBC 5.8  HGB 11.9*  HCT 35.0  PLT 249   ------------------------------------------------------------------------------------------------------------------  Chemistries   Recent Labs Lab 07/31/15 1145  NA 138  K 3.9  CL 104  CO2 24  GLUCOSE 105*  BUN 14  CREATININE 0.85  CALCIUM 9.1  AST 20  ALT 11*  ALKPHOS 111  BILITOT 0.5   ------------------------------------------------------------------------------------------------------------------  Cardiac Enzymes  Recent Labs Lab 07/31/15 1145  TROPONINI <0.03   ------------------------------------------------------------------------------------------------------------------  RADIOLOGY:  Ct Head Wo Contrast  07/31/2015   CLINICAL DATA:  Confusion and weakness  EXAM: CT HEAD WITHOUT CONTRAST  TECHNIQUE: Contiguous axial images were obtained from the base of the skull through the vertex without intravenous contrast.  COMPARISON:  CT head 02/03/2015  FINDINGS: Ventricle size normal.  Cerebral volume normal.  Patchy hypodensity in the cerebral white matter bilaterally is stable and consistent with chronic microvascular ischemia.  Negative for acute infarct.  Negative for hemorrhage or mass.  Negative for skull fracture.  IMPRESSION: Chronic microvascular ischemic change in the white matter. No acute abnormality.   Electronically Signed   By: Marlan Palau M.D.   On: 07/31/2015 16:35   Dg Chest Port 1 View  07/31/2015   CLINICAL DATA:  Nightsweats.  Sick feeling Initial encounter  EXAM: PORTABLE CHEST - 1 VIEW  COMPARISON:  03/01/2015  FINDINGS: Normal cardiac silhouette. Lungs are hyperinflated. No effusion, infiltrate, or pneumothorax.  IMPRESSION: Hyperinflated lungs without acute findings.   Electronically Signed   By: Genevive Bi M.D.   On: 07/31/2015 16:49    EKG:   Orders placed or performed during the hospital encounter  of 07/31/15  . EKG test  . EKG test    IMPRESSION AND PLAN:  Principal Problem:   Acute encephalopathy - unclear etiology at this time. Multiple extensive workups in the past have also not elucidated a clear cause. We will admit her for now to monitor her for improvement. We will not start antibiotics at this time  is her white count is normal and she has been afebrile while here with [place], but we will consider doing so if she has any change in her status including development of fever, elevation of her white count, or failure to improve. We will send blood and urine cultures, and follow up as appropriate. If she does not begin to improve by the morning, we will consider getting a neurology consult and escalating her workup. Possibly she is very susceptible to acute delirium, perhaps due to a viral gastroenteritis (see below). We will keep her hydrated with IV fluids tonight. Active Problems:   Gastrointestinal complaints, nonspecific - unclear history and etiology here. No vomiting or diarrhea reported. We'll continue to monitor this, I generally IV fluids. Potentially a viral gastroenteritis?   Essential hypertension - currently controlled, continue home meds.   Peripheral blood vessel disorder - continue home medications   COPD (chronic obstructive pulmonary disease) - continue home inhalers   Dementia - we will hold this medication for now, to be continued once the patient starts to wake up somewhat   GERD (gastroesophageal reflux disease) - continue home meds   Hyperlipemia - hold this for now as it is not essential, restart once the patient wakes up some more.  All the records are reviewed and case discussed with ED provider. Management plans discussed with the patient and/or family.  DVT PROPHYLAXIS: SubQ lovenox  ADMISSION STATUS: Inpatient  CODE STATUS: Full, though family states the patient has stated in the past that she would not want any long-term life support  TOTAL TIME TAKING  CARE OF THIS PATIENT: 50 minutes.    Dolce Sylvia FIELDING 07/31/2015, 9:29 PM  [facility]  Office  [phone]  CC: Primary care physician; Mila Merry, MD

## 2015-07-31 NOTE — ED Notes (Signed)
Pt husband Katyra Tomassetti [phone]

## 2015-08-01 ENCOUNTER — Encounter: Payer: Self-pay | Admitting: *Deleted

## 2015-08-01 ENCOUNTER — Observation Stay: Payer: Commercial Managed Care - HMO

## 2015-08-01 DIAGNOSIS — R198 Other specified symptoms and signs involving the digestive system and abdomen: Secondary | ICD-10-CM | POA: Diagnosis not present

## 2015-08-01 DIAGNOSIS — K219 Gastro-esophageal reflux disease without esophagitis: Secondary | ICD-10-CM | POA: Diagnosis not present

## 2015-08-01 DIAGNOSIS — J449 Chronic obstructive pulmonary disease, unspecified: Secondary | ICD-10-CM | POA: Diagnosis not present

## 2015-08-01 DIAGNOSIS — G9340 Encephalopathy, unspecified: Secondary | ICD-10-CM | POA: Diagnosis not present

## 2015-08-01 DIAGNOSIS — I739 Peripheral vascular disease, unspecified: Secondary | ICD-10-CM | POA: Diagnosis not present

## 2015-08-01 DIAGNOSIS — I10 Essential (primary) hypertension: Secondary | ICD-10-CM | POA: Diagnosis not present

## 2015-08-01 DIAGNOSIS — M1990 Unspecified osteoarthritis, unspecified site: Secondary | ICD-10-CM | POA: Diagnosis not present

## 2015-08-01 DIAGNOSIS — I2510 Atherosclerotic heart disease of native coronary artery without angina pectoris: Secondary | ICD-10-CM | POA: Diagnosis not present

## 2015-08-01 DIAGNOSIS — Z79899 Other long term (current) drug therapy: Secondary | ICD-10-CM | POA: Diagnosis not present

## 2015-08-01 DIAGNOSIS — G9341 Metabolic encephalopathy: Secondary | ICD-10-CM | POA: Diagnosis not present

## 2015-08-01 DIAGNOSIS — F0390 Unspecified dementia without behavioral disturbance: Secondary | ICD-10-CM | POA: Diagnosis not present

## 2015-08-01 DIAGNOSIS — E785 Hyperlipidemia, unspecified: Secondary | ICD-10-CM | POA: Diagnosis not present

## 2015-08-01 LAB — BASIC METABOLIC PANEL
ANION GAP: 8 (ref 5–15)
BUN: 17 mg/dL (ref 6–20)
CALCIUM: 8.6 mg/dL — AB (ref 8.9–10.3)
CO2: 24 mmol/L (ref 22–32)
CREATININE: 0.77 mg/dL (ref 0.44–1.00)
Chloride: 108 mmol/L (ref 101–111)
Glucose, Bld: 82 mg/dL (ref 65–99)
Potassium: 3.9 mmol/L (ref 3.5–5.1)
Sodium: 140 mmol/L (ref 135–145)

## 2015-08-01 LAB — CBC
HCT: 33.1 % — ABNORMAL LOW (ref 35.0–47.0)
HEMOGLOBIN: 10.8 g/dL — AB (ref 12.0–16.0)
MCH: 30.5 pg (ref 26.0–34.0)
MCHC: 32.5 g/dL (ref 32.0–36.0)
MCV: 93.8 fL (ref 80.0–100.0)
PLATELETS: 223 10*3/uL (ref 150–440)
RBC: 3.53 MIL/uL — AB (ref 3.80–5.20)
RDW: 14.1 % (ref 11.5–14.5)
WBC: 4.6 10*3/uL (ref 3.6–11.0)

## 2015-08-01 LAB — BLOOD GAS, VENOUS
Acid-Base Excess: 2.7 mmol/L (ref 0.0–3.0)
Bicarbonate: 27.9 mEq/L (ref 21.0–28.0)
PH VEN: 7.41 (ref 7.320–7.430)
Patient temperature: 37
pCO2, Ven: 44 mmHg (ref 44.0–60.0)

## 2015-08-01 LAB — URINE DRUG SCREEN, QUALITATIVE (ARMC ONLY)
Amphetamines, Ur Screen: NOT DETECTED
Barbiturates, Ur Screen: NOT DETECTED
Benzodiazepine, Ur Scrn: NOT DETECTED
COCAINE METABOLITE, UR ~~LOC~~: NOT DETECTED
Cannabinoid 50 Ng, Ur ~~LOC~~: NOT DETECTED
MDMA (ECSTASY) UR SCREEN: NOT DETECTED
METHADONE SCREEN, URINE: NOT DETECTED
Opiate, Ur Screen: NOT DETECTED
Phencyclidine (PCP) Ur S: NOT DETECTED
TRICYCLIC, UR SCREEN: NOT DETECTED

## 2015-08-01 LAB — TROPONIN I

## 2015-08-01 LAB — TSH: TSH: 0.791 u[IU]/mL (ref 0.350–4.500)

## 2015-08-01 MED ORDER — ONDANSETRON HCL 4 MG/2ML IJ SOLN: 4.0000 mg | Freq: Four times a day (QID) | INTRAMUSCULAR | Status: DC | PRN

## 2015-08-01 MED ORDER — ACETAMINOPHEN 325 MG PO TABS
650.0000 mg | ORAL_TABLET | Freq: Four times a day (QID) | ORAL | Status: DC | PRN
  Administered 2015-08-01 – 2015-08-02 (×2): 650 mg via ORAL
  Filled 2015-08-01 (×2): qty 2

## 2015-08-01 MED ORDER — ONDANSETRON HCL 4 MG PO TABS: 4.0000 mg | ORAL_TABLET | Freq: Four times a day (QID) | ORAL | Status: DC | PRN

## 2015-08-01 MED ORDER — ACETAMINOPHEN 650 MG RE SUPP: 650.0000 mg | Freq: Four times a day (QID) | RECTAL | Status: DC | PRN

## 2015-08-01 MED ORDER — CLOPIDOGREL BISULFATE 75 MG PO TABS
75.0000 mg | ORAL_TABLET | Freq: Every day | ORAL | Status: DC
  Administered 2015-08-02 – 2015-08-03 (×2): 75 mg via ORAL
  Filled 2015-08-01 (×3): qty 1

## 2015-08-01 MED ORDER — MOMETASONE FURO-FORMOTEROL FUM 100-5 MCG/ACT IN AERO
2.0000 | INHALATION_SPRAY | Freq: Two times a day (BID) | RESPIRATORY_TRACT | Status: DC
  Administered 2015-08-01 – 2015-08-03 (×4): 2 via RESPIRATORY_TRACT
  Filled 2015-08-01: qty 8.8

## 2015-08-01 MED ORDER — TIOTROPIUM BROMIDE MONOHYDRATE 18 MCG IN CAPS
18.0000 ug | ORAL_CAPSULE | Freq: Every day | RESPIRATORY_TRACT | Status: DC
  Administered 2015-08-02 – 2015-08-03 (×2): 18 ug via RESPIRATORY_TRACT
  Filled 2015-08-01: qty 5

## 2015-08-01 MED ORDER — ENOXAPARIN SODIUM 40 MG/0.4ML ~~LOC~~ SOLN
40.0000 mg | SUBCUTANEOUS | Status: DC
  Administered 2015-08-01 – 2015-08-02 (×2): 40 mg via SUBCUTANEOUS
  Filled 2015-08-01 (×2): qty 0.4

## 2015-08-01 MED ORDER — SODIUM CHLORIDE 0.9 % IV SOLN
INTRAVENOUS | Status: AC
  Administered 2015-08-01: 01:00:00 via INTRAVENOUS

## 2015-08-01 MED ORDER — PANTOPRAZOLE SODIUM 40 MG IV SOLR
40.0000 mg | INTRAVENOUS | Status: DC
  Administered 2015-08-01 – 2015-08-03 (×3): 40 mg via INTRAVENOUS
  Filled 2015-08-01 (×3): qty 40

## 2015-08-01 NOTE — Progress Notes (Signed)
[facility] - [place] [facility]   PATIENT NAME: Cheryl [name]    MR#:  [id number]  DATE OF BIRTH:  May 04, 1951  SUBJECTIVE:  CHIEF COMPLAINT:   Chief Complaint  Patient presents with  . URI  unable to contribute in any discussion. Moving her extremities without difficulty. Family at bedside. Opens her eye but not talking, falls back to sleep easily. Post-ictal? REVIEW OF SYSTEMS:  Review of Systems  Unable to perform ROS: mental acuity   DRUG ALLERGIES:   Allergies  Allergen Reactions  . Percocet [Oxycodone-Acetaminophen] Hives  . Aspirin Nausea Only and Other (See Comments)    Reaction:  GI upset   . Codeine Nausea Only and Other (See Comments)    Reaction:  GI upset   . Morphine Other (See Comments)    Reaction:  Unknown   . Pregabalin Other (See Comments)    Reaction:  Makes pt sleep excessively.    . Propoxyphene Other (See Comments)    Reaction:  GI upset   . Sulfa Antibiotics Rash and Other (See Comments)    Reaction:  GI upset    VITALS:  Blood pressure 140/49, pulse 86, temperature 98.3 F (36.8 C), temperature source Oral, resp. rate 16, height 5\' 1"  (1.549 m), weight 45.949 kg (101 lb 4.8 oz), SpO2 94 %. PHYSICAL EXAMINATION:  Physical Exam  Constitutional: She is well-developed, well-nourished, and in no distress.  HENT:  Head: Normocephalic and atraumatic.  Eyes: Conjunctivae and EOM are normal. Pupils are equal, round, and reactive to light.  Neck: Normal range of motion. Neck supple. No tracheal deviation present. No thyromegaly present.  Cardiovascular: Normal rate, regular rhythm and normal heart sounds.   Pulmonary/Chest: Effort normal and breath sounds normal. No respiratory distress. She has no wheezes. She exhibits no tenderness.  Abdominal: Soft. Bowel sounds are normal. She exhibits no distension. There is no tenderness.  Musculoskeletal: Normal range of motion.  Neurological: No cranial nerve deficit.  Nonfocal  examination, she is lethargic, not able to participate in any exam  Skin: Skin is warm and dry. No rash noted.   LABORATORY PANEL:   CBC  Recent Labs Lab 08/01/15 0048  WBC 4.6  HGB 10.8*  HCT 33.1*  PLT 223   ------------------------------------------------------------------------------------------------------------------ Chemistries   Recent Labs Lab 07/31/15 1145 08/01/15 0048  NA 138 140  K 3.9 3.9  CL 104 108  CO2 24 24  GLUCOSE 105* 82  BUN 14 17  CREATININE 0.85 0.77  CALCIUM 9.1 8.6*  AST 20  --   ALT 11*  --   ALKPHOS 111  --   BILITOT 0.5  --    RADIOLOGY:  Ct Head Wo Contrast  07/31/2015   CLINICAL DATA:  Confusion and weakness  EXAM: CT HEAD WITHOUT CONTRAST  TECHNIQUE: Contiguous axial images were obtained from the base of the skull through the vertex without intravenous contrast.  COMPARISON:  CT head 02/03/2015  FINDINGS: Ventricle size normal.  Cerebral volume normal.  Patchy hypodensity in the cerebral white matter bilaterally is stable and consistent with chronic microvascular ischemia.  Negative for acute infarct.  Negative for hemorrhage or mass.  Negative for skull fracture.  IMPRESSION: Chronic microvascular ischemic change in the white matter. No acute abnormality.   Electronically Signed   By: Marlan Palau M.D.   On: 07/31/2015 16:35   Dg Chest Port 1 View  07/31/2015   CLINICAL DATA:  Nightsweats.  Sick feeling Initial encounter  EXAM: PORTABLE CHEST -  1 VIEW  COMPARISON:  03/01/2015  FINDINGS: Normal cardiac silhouette. Lungs are hyperinflated. No effusion, infiltrate, or pneumothorax.  IMPRESSION: Hyperinflated lungs without acute findings.   Electronically Signed   By: Genevive Bi M.D.   On: 07/31/2015 16:49   ASSESSMENT AND PLAN:  * Acute encephalopathy - unclear etiology at this time. Multiple extensive workups in the past have also not elucidated a clear cause. We will not start antibiotics at this time is her white count is normal and  she has been afebrile while here with [place], but we will consider doing so if she has any change in her status including development of fever, elevation of her white count, or failure to improve. await blood and urine cultures, and follow up as appropriate. Possibly she is very susceptible to acute delirium, perhaps due to a viral gastroenteritis (see below). We will keep her hydrated with IV fluids tonight. Cannot rule out seizure, at this time - will await neurology input.  Order EEG  * Gastrointestinal complaints, nonspecific - unclear history and etiology here. No vomiting or diarrhea reported.  continue to monitor this, IV fluids. Potentially a viral gastroenteritis?   * Essential hypertension - currently controlled, continue home meds. * Peripheral blood vessel disorder - continue home medications * COPD (chronic obstructive pulmonary disease) - continue home inhalers * Dementia - we will hold this medication for now, to be continued once the patient starts to wake up somewhat * GERD (gastroesophageal reflux disease) - continue home meds * Hyperlipemia - hold this for now as it is not essential, restart once the patient wakes up some more.    All the records are reviewed and case discussed with Care Management/Social Worker. Management plans discussed with the patient, family and they are in agreement.  CODE STATUS: Full code  TOTAL TIME TAKING CARE OF THIS PATIENT: 35 minutes.   More than 50% of the time was spent in counseling/coordination of care: YES.  (Discussed with family at bedside.  Patient's son and daughter-in-law at bedside.  Also discussed with her husband on the phone)  POSSIBLE D/C IN 2-3 DAYS, DEPENDING ON CLINICAL CONDITION.   [facility], Aairah Negrette M.D on 08/01/2015 at 11:55 AM  Between 7am to 6pm - Pager - [phone]  After 6pm go to [email or web address] - password [email or web address]  [facility]  Office  [phone]  CC:  Primary care physician; Mila Merry, MD

## 2015-08-01 NOTE — Patient Outreach (Signed)
[facility]) Care Management  08/01/2015  Crosby Bevan 05/16/1951 [id number]   Order completed for transfer to Community Case Management for transition of care.  Bary Leriche, RN, MSN [facility] Care Management RN Telephonic Health Coach [phone]

## 2015-08-01 NOTE — Addendum Note (Signed)
Addended by: Bary Leriche on: 08/01/2015 09:50 AM   Modules accepted: Orders

## 2015-08-01 NOTE — Patient Outreach (Signed)
[facility]) Care Management  08/01/2015  Cheryl [name] 1951/09/11 [id number]   Request from Fleeta Emmer, RN to assigned [employer], assigned Ma Rings Minor, Charity fundraiser.  Thanks, Corrie Mckusick. Sharlee Blew [facility] Care Management [facility] CM Assistant Phone: [phone] Fax: [phone]

## 2015-08-02 DIAGNOSIS — I2510 Atherosclerotic heart disease of native coronary artery without angina pectoris: Secondary | ICD-10-CM | POA: Diagnosis not present

## 2015-08-02 DIAGNOSIS — F0390 Unspecified dementia without behavioral disturbance: Secondary | ICD-10-CM | POA: Diagnosis not present

## 2015-08-02 DIAGNOSIS — R198 Other specified symptoms and signs involving the digestive system and abdomen: Secondary | ICD-10-CM | POA: Diagnosis not present

## 2015-08-02 DIAGNOSIS — Z79899 Other long term (current) drug therapy: Secondary | ICD-10-CM | POA: Diagnosis not present

## 2015-08-02 DIAGNOSIS — M1990 Unspecified osteoarthritis, unspecified site: Secondary | ICD-10-CM | POA: Diagnosis not present

## 2015-08-02 DIAGNOSIS — E785 Hyperlipidemia, unspecified: Secondary | ICD-10-CM | POA: Diagnosis not present

## 2015-08-02 DIAGNOSIS — G9340 Encephalopathy, unspecified: Secondary | ICD-10-CM

## 2015-08-02 DIAGNOSIS — K219 Gastro-esophageal reflux disease without esophagitis: Secondary | ICD-10-CM | POA: Diagnosis not present

## 2015-08-02 DIAGNOSIS — J449 Chronic obstructive pulmonary disease, unspecified: Secondary | ICD-10-CM | POA: Diagnosis not present

## 2015-08-02 DIAGNOSIS — G9341 Metabolic encephalopathy: Secondary | ICD-10-CM | POA: Diagnosis not present

## 2015-08-02 DIAGNOSIS — I10 Essential (primary) hypertension: Secondary | ICD-10-CM | POA: Diagnosis not present

## 2015-08-02 DIAGNOSIS — I739 Peripheral vascular disease, unspecified: Secondary | ICD-10-CM | POA: Diagnosis not present

## 2015-08-02 LAB — URINE CULTURE: Culture: 1000

## 2015-08-02 LAB — GLUCOSE, CAPILLARY: GLUCOSE-CAPILLARY: 71 mg/dL (ref 65–99)

## 2015-08-02 MED ORDER — PREGABALIN 75 MG PO CAPS
150.0000 mg | ORAL_CAPSULE | Freq: Two times a day (BID) | ORAL | Status: DC
  Administered 2015-08-02 – 2015-08-03 (×3): 150 mg via ORAL
  Filled 2015-08-02 (×3): qty 2

## 2015-08-02 MED ORDER — TRAMADOL HCL 50 MG PO TABS
100.0000 mg | ORAL_TABLET | Freq: Four times a day (QID) | ORAL | Status: DC
  Administered 2015-08-02 – 2015-08-03 (×4): 100 mg via ORAL
  Filled 2015-08-02 (×4): qty 2

## 2015-08-02 NOTE — Progress Notes (Signed)
[facility] Physicians - [place] at [facility]   PATIENT NAME: Cheryl [name]    MR#:  [id number]  DATE OF BIRTH:  [birth year]  SUBJECTIVE:  CHIEF COMPLAINT:   Chief Complaint  Patient presents with  . URI  seems much alert. Having lots of leg pain and husband wanting to restart Lyrica, tramadol and fentanyl, feels very weak REVIEW OF SYSTEMS:  Review of Systems  Constitutional: Negative for fever, weight loss, malaise/fatigue and diaphoresis.  HENT: Negative for ear discharge, ear pain, hearing loss, nosebleeds, sore throat and tinnitus.   Eyes: Negative for blurred vision and pain.  Respiratory: Negative for cough, hemoptysis, shortness of breath and wheezing.   Cardiovascular: Negative for chest pain, palpitations, orthopnea and leg swelling.  Gastrointestinal: Negative for heartburn, nausea, vomiting, abdominal pain, diarrhea, constipation and blood in stool.  Genitourinary: Negative for dysuria, urgency and frequency.  Musculoskeletal: Positive for back pain. Negative for myalgias.  Skin: Negative for itching and rash.  Neurological: Negative for dizziness, tingling, tremors, focal weakness, seizures, weakness and headaches.  Psychiatric/Behavioral: Negative for depression. The patient is not nervous/anxious.    DRUG ALLERGIES:   Allergies  Allergen Reactions  . Percocet [Oxycodone-Acetaminophen] Hives  . Aspirin Nausea Only and Other (See Comments)    Reaction:  GI upset   . Codeine Nausea Only and Other (See Comments)    Reaction:  GI upset   . Morphine Other (See Comments)    Reaction:  Unknown   . Pregabalin Other (See Comments)    Reaction:  Makes pt sleep excessively.    . Propoxyphene Other (See Comments)    Reaction:  GI upset   . Sulfa Antibiotics Rash and Other (See Comments)    Reaction:  GI upset    VITALS:  Blood pressure 156/64, pulse 74, temperature 97.9 F (36.6 C), temperature source Oral, resp. rate 16, height 5\' 1"  (1.549 m), weight  45.949 kg (101 lb 4.8 oz), SpO2 98 %. PHYSICAL EXAMINATION:  Physical Exam  Constitutional: She is oriented to person, place, and time. Vital signs are normal. She appears malnourished. She appears unhealthy.  HENT:  Head: Normocephalic and atraumatic.  Eyes: Conjunctivae and EOM are normal. Pupils are equal, round, and reactive to light.  Neck: Normal range of motion. Neck supple. No tracheal deviation present. No thyromegaly present.  Cardiovascular: Normal rate, regular rhythm and normal heart sounds.   Pulmonary/Chest: Effort normal and breath sounds normal. No respiratory distress. She has no wheezes. She exhibits no tenderness.  Abdominal: Soft. Bowel sounds are normal. She exhibits no distension. There is no tenderness.  Musculoskeletal: Normal range of motion.  Neurological: She is alert and oriented to person, place, and time. No cranial nerve deficit.  Skin: Skin is warm and dry. No rash noted.   LABORATORY PANEL:   CBC  Recent Labs Lab 08/01/15 0048  WBC 4.6  HGB 10.8*  HCT 33.1*  PLT 223   ------------------------------------------------------------------------------------------------------------------ Chemistries   Recent Labs Lab 07/31/15 1145 08/01/15 0048  NA 138 140  K 3.9 3.9  CL 104 108  CO2 24 24  GLUCOSE 105* 82  BUN 14 17  CREATININE 0.85 0.77  CALCIUM 9.1 8.6*  AST 20  --   ALT 11*  --   ALKPHOS 111  --   BILITOT 0.5  --    RADIOLOGY:  No results found. ASSESSMENT AND PLAN:  * Acute encephalopathy - could be due to overuse of pain meds - fentanyl?, will hold. Will restart lyrica  and tramadol and monitor. EEG - ok.  * Gastrointestinal complaints, nonspecific - resolved   * Essential hypertension - currently controlled, continue home meds. * Peripheral blood vessel disorder - continue home medications * COPD (chronic obstructive pulmonary disease) - continue home inhalers * Dementia - resume home meds on D/C * GERD (gastroesophageal  reflux disease) - continue home meds * Hyperlipemia - resume home meds on D/C    All the records are reviewed and case discussed with Care Management/Social Worker. Management plans discussed with the patient, family and they are in agreement.  CODE STATUS: Full code  TOTAL TIME TAKING CARE OF THIS PATIENT: 35 minutes.   More than 50% of the time was spent in counseling/coordination of care: YES.  (Discussed with husband at bedside)  POSSIBLE D/C IN AM, DEPENDING ON CLINICAL CONDITION.   [facility], Joliene Salvador M.D on 08/02/2015 at 1:41 PM  Between 7am to 6pm - Pager - [phone]  After 6pm go to [email or web address] - password [email or web address]  [facility] [place]  Office  [phone]  CC:  Primary care physician; Mila Merry, MD

## 2015-08-02 NOTE — Progress Notes (Signed)
Dr. Sherryll Burger paged for diet order; regular diet ordered

## 2015-08-02 NOTE — Consult Note (Signed)
CC: confusion and disorientation   HPI: Cheryl [name] is an 64 y.o. female admitted with AMS. Pt has history of encephalopathy on/off that lasts a few days after which point resolves.  No clear cause has been found.  No clear source of infection. Pt does have history of dementia.    Past Medical History  Diagnosis Date  . COPD (chronic obstructive pulmonary disease)   . Hypertension   . Arthritis   . Hyperlipidemia   . GERD (gastroesophageal reflux disease)   . Neuropathy 2010  . Oxygen deficiency   . Coronary artery disease   . Headache   . Anxiety   . Peripheral vascular disease   . Vitamin D deficiency     Past Surgical History  Procedure Laterality Date  . Appendectomy    . Spine surgery    . Foot surgery Bilateral     5-6 years per patient  . Cardiac catherization  10/31/2009  . Abdomnal aortic stent  05/30/2008    Dr. Nanetta Batty  . Abdominal hysterectomy  1975    Bilaterl Oophorectomy; Dur to IUD infection  . Cholecystectomy  1972  . Cervical fusion  C5 - 6/C6-7  . Appendectomy    . Colonoscopy with propofol N/A 07/27/2015    Procedure: COLONOSCOPY WITH PROPOFOL;  Surgeon: Wallace Cullens, MD;  Location: [facility] ENDOSCOPY;  Service: Gastroenterology;  Laterality: N/A;  . Esophagogastroduodenoscopy (egd) with propofol N/A 07/27/2015    Procedure: ESOPHAGOGASTRODUODENOSCOPY (EGD) WITH PROPOFOL;  Surgeon: Wallace Cullens, MD;  Location: [facility] ENDOSCOPY;  Service: Gastroenterology;  Laterality: N/A;    Family History  Problem Relation Age of Onset  . Cancer Mother   . Cancer Father   . Diabetes Sister   . Cancer Brother   . Diabetes Brother     Social History:  reports that she has been smoking Cigarettes.  She has been smoking about 1.00 pack per day. She does not have any smokeless tobacco history on file. She reports that she does not drink alcohol. Her drug history is not on file.  Allergies  Allergen Reactions  . Percocet [Oxycodone-Acetaminophen] Hives  . Aspirin  Nausea Only and Other (See Comments)    Reaction:  GI upset   . Codeine Nausea Only and Other (See Comments)    Reaction:  GI upset   . Morphine Other (See Comments)    Reaction:  Unknown   . Pregabalin Other (See Comments)    Reaction:  Makes pt sleep excessively.    . Propoxyphene Other (See Comments)    Reaction:  GI upset   . Sulfa Antibiotics Rash and Other (See Comments)    Reaction:  GI upset     Medications: I have reviewed the patient's current medications.  Physical Examination: Blood pressure 138/54, pulse 67, temperature 98.4 F (36.9 C), temperature source Oral, resp. rate 18, height 5\' 1"  (1.549 m), weight 45.949 kg (101 lb 4.8 oz), SpO2 96 %.  Alert to name date and time EOM intact No facial assymetry No focal weakness gen 4/4 bilaterally   Laboratory Studies:   Basic Metabolic Panel:  Recent Labs Lab 07/31/15 1145 08/01/15 0048  NA 138 140  K 3.9 3.9  CL 104 108  CO2 24 24  GLUCOSE 105* 82  BUN 14 17  CREATININE 0.85 0.77  CALCIUM 9.1 8.6*    Liver Function Tests:  Recent Labs Lab 07/31/15 1145  AST 20  ALT 11*  ALKPHOS 111  BILITOT 0.5  PROT  7.3  ALBUMIN 4.0   No results for input(s): LIPASE, AMYLASE in the last 168 hours.  Recent Labs Lab 07/31/15 1646  AMMONIA 16    CBC:  Recent Labs Lab 07/31/15 1145 08/01/15 0048  WBC 5.8 4.6  HGB 11.9* 10.8*  HCT 35.0 33.1*  MCV 92.7 93.8  PLT 249 223    Cardiac Enzymes:  Recent Labs Lab 07/31/15 1145 08/01/15 0048 08/01/15 0637 08/01/15 1216  TROPONINI <0.03 <0.03 <0.03 <0.03    BNP: Invalid input(s): POCBNP  CBG:  Recent Labs Lab 08/02/15 0043  GLUCAP 71    Microbiology: Results for orders placed or performed in visit on 08/20/14  Urine culture     Status: None   Collection Time: 08/19/14 10:00 PM  Result Value Ref Range Status   Micro Text Report   Final       SOURCE: CLEAN CATCH    COMMENT                   NO GROWTH IN 18-24 HOURS   ANTIBIOTIC                                                       Culture, blood (single)     Status: None   Collection Time: 08/20/14 11:42 AM  Result Value Ref Range Status   Micro Text Report   Final       COMMENT                   NO GROWTH AEROBICALLY/ANAEROBICALLY IN 5 DAYS   ANTIBIOTIC                                                      Culture, blood (single)     Status: None   Collection Time: 08/20/14 11:47 AM  Result Value Ref Range Status   Micro Text Report   Final       COMMENT                   NO GROWTH AEROBICALLY/ANAEROBICALLY IN 5 DAYS   ANTIBIOTIC                                                      Influenza A&B Antigens [facility])     Status: None   Collection Time: 08/20/14  2:14 PM  Result Value Ref Range Status   Micro Text Report   Final       COMMENT                   NEGATIVE FOR INFLUENZA A (ANTIGEN ABSENT)   COMMENT                   NEGATIVE FOR INFLUENZA B (ANTIGEN ABSENT)   ANTIBIOTIC  Coagulation Studies: No results for input(s): LABPROT, INR in the last 72 hours.  Urinalysis:  Recent Labs Lab 07/31/15 1637  COLORURINE YELLOW*  LABSPEC 1.023  PHURINE 5.0  GLUCOSEU NEGATIVE  HGBUR 1+*  BILIRUBINUR NEGATIVE  KETONESUR 1+*  PROTEINUR NEGATIVE  NITRITE NEGATIVE  LEUKOCYTESUR NEGATIVE    Lipid Panel:     Component Value Date/Time   CHOL 153 07/03/2015 1205   CHOL  10/31/2009 0443    165 (NOTE) ATP III Classification:      < 200        mg/dL        Desirable     960 - 239     mg/dL        Borderline High     >= 240        mg/dL        High    TRIG 76 07/03/2015 1205   HDL 93 07/03/2015 1205   HDL 72 10/31/2009 0443   CHOLHDL 1.6 07/03/2015 1205   CHOLHDL 2.3 10/31/2009 0443   VLDL 21 10/31/2009 0443   LDLCALC 45 07/03/2015 1205   LDLCALC  10/31/2009 0443    72 (NOTE)  Total Cholesterol/HDL Ratio:CHD Risk                       Coronary Heart Disease Risk Table                                        Men       Women         1/2 Average Risk              3.4        3.3             Average Risk              5.0         4.4         2 X Average Risk              9.6        7.1         3 X Average Risk             23.4       11.0 Use the calculated Patient Ratio above and the CHD Risk table  to determine the patient's CHD Risk. ATP III Classification (LDL):      < 100         mg/dL         Optimal     454 - 129     mg/dL         Near or Above Optimal     130 - 159     mg/dL         Borderline High     160 - 189     mg/dL         High      > 098        mg/dL         Very High     [id number]:  Lab Results  Component Value Date   HGBA1C  10/30/2009    5.9 (NOTE) The ADA recommends the following therapeutic goal for glycemic control related to Hgb A1c measurement: Goal of therapy: <6.5 Hgb A1c  Reference: American Diabetes Association: Clinical Practice Recommendations 2010, Diabetes Care, 2010, 33: (Suppl  1).    Urine Drug Screen:     Component Value Date/Time   LABOPIA NONE DETECTED 07/31/2015 1637   LABBENZ NONE DETECTED 07/31/2015 1637   AMPHETMU NONE DETECTED 07/31/2015 1637   THCU NONE DETECTED 07/31/2015 1637   LABBARB NONE DETECTED 07/31/2015 1637    Alcohol Level: No results for input(s): ETH in the last 168 hours.  Other results: EKG: normal EKG, normal sinus rhythm, unchanged from previous tracings.  Imaging: Ct Head Wo Contrast  07/31/2015   CLINICAL DATA:  Confusion and weakness  EXAM: CT HEAD WITHOUT CONTRAST  TECHNIQUE: Contiguous axial images were obtained from the base of the skull through the vertex without intravenous contrast.  COMPARISON:  CT head 02/03/2015  FINDINGS: Ventricle size normal.  Cerebral volume normal.  Patchy hypodensity in the cerebral white matter bilaterally is stable and consistent with chronic microvascular ischemia.  Negative for acute infarct.  Negative for hemorrhage or mass.  Negative for skull fracture.  IMPRESSION: Chronic microvascular  ischemic change in the white matter. No acute abnormality.   Electronically Signed   By: Marlan Palau M.D.   On: 07/31/2015 16:35   Dg Chest Port 1 View  07/31/2015   CLINICAL DATA:  [employer].  Sick feeling Initial encounter  EXAM: PORTABLE CHEST - 1 VIEW  COMPARISON:  03/01/2015  FINDINGS: Normal cardiac silhouette. Lungs are hyperinflated. No effusion, infiltrate, or pneumothorax.  IMPRESSION: Hyperinflated lungs without acute findings.   Electronically Signed   By: Genevive Bi M.D.   On: 07/31/2015 16:49     Assessment/Plan:  64 y.o. female admitted with AMS. Pt has history of encephalopathy on/off that lasts a few days after which point resolves.  No clear cause has been found.  No clear source of infection. Pt does have history of dementia. CTH no acute abnormality EEG diffuse slowing Frequent changes in mental status possible due to fentanyl would consider holding it Mental status probably close to baseline D/c planning 08/02/2015, 7:49 AM

## 2015-08-02 NOTE — Evaluation (Signed)
Physical Therapy Evaluation Patient Details Name: Cheryl [name] MRN: [id number] DOB: 1951-04-17 Today's Date: 08/02/2015   History of Present Illness  Cheryl [name]  is a 63 y.o. female who presents with acute encephalopathy. Patient was unable to contribute to her history when she arrived, and so information was taken from her husband and daughter who are present at the bedside. They stated the patient has had episodes similar to this in the past where she will become acutely encephalopathic for anywhere from 2-5 days and prior workups, which have been extensive, have never elucidated a clear cause. Patient has had no documented fevers, no other true infectious symptoms except for some mild complaint yesterday of GI upset without diarrhea or vomiting. She recently just had cardiac and GI workups including a negative stress test and a benign EGD. Her prior workups for these encephalopathic episodes in the past have included MRI and lumbar puncture, all which have returned without any explanation for symptoms.  On evaluation in our ED today the patient's labs are within normal limits, CT head shows chronic white matter changes but no acute abnormality. Patient's vital signs are within normal limits. Patient does have a history of dementia, though she seems to be very functional at baseline. Neurology has evaluated patient and believes that her encephalopathy may be due to overuse of pain medication. Upon arrival pt is AOx3 and able to converse appropriately with therapist. Husband reports that pt is still more confused than her baseline. He reports frequent falls, sometimes up to 6 per week.   Clinical Impression  Pt is AOx3 at time of PT evaluation but is somewhat withdrawn and offers minimal spontaneous conversation without questioning. Husband reports that pt is still deviated from her baseline cognitive status. Pt demonstrates good bed mobility but is very unstable during transfers and has poor balance  without UE assist in standing. She has frequent falls at home, sometimes as many as 6 per week prior to admission. Pt ambulates a short distance with rolling walker and is unstable but no overt loss of balance. She moves very slowly and refuses further ambulation at this time. Pt is at home alone during the daytime hour and if she chooses to return home will need 24/7 assistance as she is a high risk for future falls with injury. Husband confirms that she has bone density issues making her at increase risk for fractures with fall. PT recommending SNF but family and patient currently refusing. Pt will benefit from skilled PT services to address deficits in strength, balance, and mobility in order to return to full function at home.     Follow Up Recommendations SNF (Currently refuses, please arrange [facility] PT and 24/7 assistance)    Equipment Recommendations  None recommended by PT    Recommendations for Other Services       Precautions / Restrictions Precautions Precautions: Fall Restrictions Weight Bearing Restrictions: No      Mobility  Bed Mobility Overal bed mobility: Independent                Transfers Overall transfer level: Needs assistance Equipment used: Rolling walker (2 wheeled) Transfers: Sit to/from Stand Sit to Stand: Min assist         General transfer comment: Posterior LOB, poor safety awareness, decreased weight shifting, cues for safe hand placement  Ambulation/Gait Ambulation/Gait assistance: Min guard Ambulation Distance (Feet): 30 Feet Assistive device: Rolling walker (2 wheeled) Gait Pattern/deviations: Decreased step length - right;Decreased step length - left  Gait velocity interpretation: <1.8 ft/sec, indicative of risk for recurrent falls General Gait Details: Pt with very slow gait, decreased step length bilateral. She is unsteady on her feet but no overt LOB during ambulation. Pt is withdrawn and offers minimal communication. Husband reports  that gait is significantly deviated from baseline. Pt refuses further ambulation at this time. Pt is a high risk for future falls  Stairs            Wheelchair Mobility    Modified Rankin (Stroke Patients Only)       Balance Overall balance assessment: Needs assistance   Sitting balance-Leahy Scale: Fair       Standing balance-Leahy Scale: Poor                 High Level Balance Comments: Pt unable to maintain balance in narrowed stance (feet not completely together) without UE support. Within 1-2 seconds she falls backwards on the bed and is unable to self-correct. Pt reports history of frequent falls at baseline but husband states balance is significantly impaired             Pertinent Vitals/Pain Pain Assessment: No/denies pain    Home Living Family/patient expects to be discharged to:: Private residence Living Arrangements: Spouse/significant other Available Help at Discharge: Family;Available PRN/intermittently Type of Home: House Home Access: Ramped entrance     Home Layout: One level Home Equipment: Walker - 2 wheels;Walker - 4 wheels;Cane - single point;Shower seat (no BSC)      Prior Function Level of Independence: Independent with assistive device(s)         Comments: Assist for IADLs     Hand Dominance        Extremity/Trunk Assessment   Upper Extremity Assessment: Generalized weakness (UE/LE grossly 3+ to 4-/5 throughout, poor effort)           Lower Extremity Assessment: Generalized weakness         Communication   Communication: No difficulties  Cognition Arousal/Alertness: Awake/alert Behavior During Therapy: WFL for tasks assessed/performed Overall Cognitive Status: History of cognitive impairments - at baseline Area of Impairment:  (Family reports she has not returned to baseline. AOx3)                    General Comments      Exercises        Assessment/Plan    PT Assessment Patient needs  continued PT services  PT Diagnosis Difficulty walking;Generalized weakness;Abnormality of gait   PT Problem List Decreased strength;Decreased activity tolerance;Decreased balance;Decreased cognition;Decreased knowledge of use of DME;Decreased safety awareness  PT Treatment Interventions DME instruction;Gait training;Functional mobility training;Therapeutic activities;Therapeutic exercise;Balance training;Neuromuscular re-education;Cognitive remediation;Patient/family education   PT Goals (Current goals can be found in the Care Plan section) Acute Rehab PT Goals Patient Stated Goal: "I want to go home" PT Goal Formulation: With patient/family Time For Goal Achievement: 08/16/15 Potential to Achieve Goals: Good    Frequency Min 2X/week   Barriers to discharge Decreased caregiver support Pt does not have 24/7 assist at home. Husband calls every hour and patient's brother lives nearby to check on her    Co-evaluation               End of Session Equipment Utilized During Treatment: Gait belt Activity Tolerance: Patient tolerated treatment well Patient left: in bed;with call bell/phone within reach;with bed alarm set;with family/visitor present (Refuses up to chair)           Time: 1645-1700 PT Time  Calculation (min) (ACUTE ONLY): 15 min   Charges:   PT Evaluation $Initial PT Evaluation Tier I: 1 Procedure     PT G Codes:       Cheryl [name] Romelo Sciandra PT, DPT   Francisco Ostrovsky 08/02/2015, 5:18 PM

## 2015-08-03 DIAGNOSIS — I10 Essential (primary) hypertension: Secondary | ICD-10-CM | POA: Diagnosis not present

## 2015-08-03 DIAGNOSIS — Z79899 Other long term (current) drug therapy: Secondary | ICD-10-CM | POA: Diagnosis not present

## 2015-08-03 DIAGNOSIS — I739 Peripheral vascular disease, unspecified: Secondary | ICD-10-CM | POA: Diagnosis not present

## 2015-08-03 DIAGNOSIS — F0390 Unspecified dementia without behavioral disturbance: Secondary | ICD-10-CM | POA: Diagnosis not present

## 2015-08-03 DIAGNOSIS — J449 Chronic obstructive pulmonary disease, unspecified: Secondary | ICD-10-CM | POA: Diagnosis not present

## 2015-08-03 DIAGNOSIS — G9341 Metabolic encephalopathy: Secondary | ICD-10-CM | POA: Diagnosis not present

## 2015-08-03 DIAGNOSIS — I2510 Atherosclerotic heart disease of native coronary artery without angina pectoris: Secondary | ICD-10-CM | POA: Diagnosis not present

## 2015-08-03 DIAGNOSIS — E785 Hyperlipidemia, unspecified: Secondary | ICD-10-CM | POA: Diagnosis not present

## 2015-08-03 DIAGNOSIS — R198 Other specified symptoms and signs involving the digestive system and abdomen: Secondary | ICD-10-CM | POA: Diagnosis not present

## 2015-08-03 DIAGNOSIS — M1990 Unspecified osteoarthritis, unspecified site: Secondary | ICD-10-CM | POA: Diagnosis not present

## 2015-08-03 DIAGNOSIS — K219 Gastro-esophageal reflux disease without esophagitis: Secondary | ICD-10-CM | POA: Diagnosis not present

## 2015-08-03 DIAGNOSIS — G9340 Encephalopathy, unspecified: Secondary | ICD-10-CM | POA: Diagnosis not present

## 2015-08-03 NOTE — Discharge Summary (Signed)
[facility] [place] at [facility]   PATIENT NAME: Cheryl [name]    MR#:  [id number]  DATE OF BIRTH:  [birth year]  DATE OF ADMISSION:  07/31/2015 ADMITTING PHYSICIAN: Oralia Manis, MD  DATE OF DISCHARGE: 08/03/2015 11:54 AM  PRIMARY CARE PHYSICIAN: Mila Merry, MD    ADMISSION DIAGNOSIS:  Confusion [R41.0] Encephalopathy acute [G93.40] DISCHARGE DIAGNOSIS:  Principal Problem:   Acute encephalopathy Active Problems:   Essential hypertension   GERD (gastroesophageal reflux disease)   Hyperlipemia   Peripheral blood vessel disorder   COPD (chronic obstructive pulmonary disease)   Dementia   Gastrointestinal complaints, nonspecific  acute metabolic encephalopathy likely due to overuse of narcotics, mainly fentanyl - she is agreeable to stop it and this has been stopped on discharge. SECONDARY DIAGNOSIS:   Past Medical History  Diagnosis Date  . COPD (chronic obstructive pulmonary disease)   . Hypertension   . Arthritis   . Hyperlipidemia   . GERD (gastroesophageal reflux disease)   . Neuropathy 2010  . Oxygen deficiency   . Coronary artery disease   . Headache   . Anxiety   . Peripheral vascular disease   . Vitamin D deficiency    HOSPITAL COURSE:  63 y.o. female who presents with acute encephalopathy. No Obvious etiology was known on admission.  We see Dr. Clarisa Kindred dictated history and physical for further details.  Patient underwent full neurological evaluation which was all essentially negative.  Her exam was nonfocal.  Neurology consultation was obtained with Dr Loretha Brasil who recommended EEG to evaluate for seizure.  This was performed and was negative.  Her symptoms were thought to be due to narcotic overuse and She was recommended to stop her fentanyl.  These were held while in the hospital including her tramadol and Lyrica.  Patient has been doing well and has been back to her normal mental status by 8 to September.  She is being discharged home  in stable condition off of fentanyl.she is agreeable with the discharge plans. DISCHARGE CONDITIONS:  Stable CONSULTS OBTAINED:  Treatment Team:  Pauletta Browns, MD DRUG ALLERGIES:   Allergies  Allergen Reactions  . Percocet [Oxycodone-Acetaminophen] Hives  . Aspirin Nausea Only and Other (See Comments)    Reaction:  GI upset   . Codeine Nausea Only and Other (See Comments)    Reaction:  GI upset   . Morphine Other (See Comments)    Reaction:  Unknown   . Pregabalin Other (See Comments)    Reaction:  Makes pt sleep excessively.    . Propoxyphene Other (See Comments)    Reaction:  GI upset   . Sulfa Antibiotics Rash and Other (See Comments)    Reaction:  GI upset    DISCHARGE MEDICATIONS:   Discharge Medication List as of 08/03/2015 11:20 AM    CONTINUE these medications which have NOT CHANGED   Details  acetaminophen (TYLENOL) 500 MG tablet Take 1,000 mg by mouth every 6 (six) hours as needed for mild pain or headache. , Until Discontinued, Historical Med    albuterol (PROVENTIL HFA;VENTOLIN HFA) 108 (90 BASE) MCG/ACT inhaler Inhale 2 puffs into the lungs every 4 (four) hours as needed for wheezing or shortness of breath., Until Discontinued, Historical Med    ALPRAZolam (XANAX) 0.5 MG tablet Take 0.5 mg by mouth daily as needed for anxiety. , Until Discontinued, Historical Med    celecoxib (CELEBREX) 200 MG capsule Take 200 mg by mouth daily. , Until Discontinued, Historical Med  clopidogrel (PLAVIX) 75 MG tablet Take 75 mg by mouth daily., Until Discontinued, Historical Med    cyclobenzaprine (FLEXERIL) 5 MG tablet Take 5-10 mg by mouth every 8 (eight) hours as needed for muscle spasms., Until Discontinued, Historical Med    diphenoxylate-atropine (LOMOTIL) 2.5-0.025 MG per tablet Take 1 tablet by mouth 2 (two) times daily., Until Discontinued, Historical Med    donepezil (ARICEPT) 10 MG tablet Take 10 mg by mouth at bedtime., Until Discontinued, Historical Med     estropipate (OGEN) 0.75 MG tablet Take 0.75 mg by mouth daily., Until Discontinued, Historical Med    Fluticasone-Salmeterol (ADVAIR) 250-50 MCG/DOSE AEPB Inhale 1 puff into the lungs 2 (two) times daily., Until Discontinued, Historical Med    furosemide (LASIX) 40 MG tablet Take 40 mg by mouth daily., Until Discontinued, Historical Med    lansoprazole (PREVACID) 30 MG capsule Take 30 mg by mouth 2 (two) times daily., Until Discontinued, Historical Med    mupirocin cream (BACTROBAN) 2 % Apply 1 application topically 3 (three) times daily., Until Discontinued, Historical Med    pregabalin (LYRICA) 150 MG capsule Take 150 mg by mouth 2 (two) times daily., Until Discontinued, Historical Med    simvastatin (ZOCOR) 10 MG tablet Take 10 mg by mouth at bedtime. , Until Discontinued, Historical Med    SUMAtriptan (IMITREX) 25 MG tablet Take 25 mg by mouth as needed for migraine. May repeat in 2 hours if headache persists or recurs., Until Discontinued, Historical Med    tiotropium (SPIRIVA) 18 MCG inhalation capsule Place 18 mcg into inhaler and inhale daily., Until Discontinued, Historical Med    traMADol (ULTRAM) 50 MG tablet Take 100 mg by mouth 4 (four) times daily. , Until Discontinued, Historical Med      STOP taking these medications     fentaNYL (DURAGESIC - DOSED MCG/HR) 50 MCG/HR        DISCHARGE INSTRUCTIONS:   DIET:  Regular diet DISCHARGE CONDITION:  Good ACTIVITY:  Activity as tolerated OXYGEN:  Home Oxygen: No.  Oxygen Delivery: room air DISCHARGE LOCATION:  home   If you experience worsening of your admission symptoms, develop shortness of breath, life threatening emergency, suicidal or homicidal thoughts you must seek medical attention immediately by calling 911 or calling your MD immediately  if symptoms less severe.  You Must read complete instructions/literature along with all the possible adverse reactions/side effects for all the Medicines you take and that  have been prescribed to you. Take any new Medicines after you have completely understood and accpet all the possible adverse reactions/side effects.   Please note  You were cared for by a hospitalist during your hospital stay. If you have any questions about your discharge medications or the care you received while you were in the hospital after you are discharged, you can call the unit and asked to speak with the hospitalist on call if the hospitalist that took care of you is not available. Once you are discharged, your primary care physician will handle any further medical issues. Please note that NO REFILLS for any discharge medications will be authorized once you are discharged, as it is imperative that you return to your primary care physician (or establish a relationship with a primary care physician if you do not have one) for your aftercare needs so that they can reassess your need for medications and monitor your lab values.    On the day of Discharge: VITAL SIGNS:  Blood pressure 119/65, pulse 60, temperature 98 F (36.7  C), temperature source Oral, resp. rate 16, height 5\' 1"  (1.549 m), weight 45.949 kg (101 lb 4.8 oz), SpO2 97 %. PHYSICAL EXAMINATION:  GENERAL:  64 y.o.-year-old patient lying in the bed with no acute distress.  EYES: Pupils equal, round, reactive to light and accommodation. No scleral icterus. Extraocular muscles intact.  HEENT: Head atraumatic, normocephalic. Oropharynx and nasopharynx clear.  NECK:  Supple, no jugular venous distention. No thyroid enlargement, no tenderness.  LUNGS: Normal breath sounds bilaterally, no wheezing, rales,rhonchi or crepitation. No use of accessory muscles of respiration.  CARDIOVASCULAR: S1, S2 normal. No murmurs, rubs, or gallops.  ABDOMEN: Soft, non-tender, non-distended. Bowel sounds present. No organomegaly or mass.  EXTREMITIES: No pedal edema, cyanosis, or clubbing.  NEUROLOGIC: Cranial nerves II through XII are intact. Muscle  strength 5/5 in all extremities. Sensation intact. Gait not checked.  PSYCHIATRIC: The patient is alert and oriented x 3.  SKIN: No obvious rash, lesion, or ulcer.  DATA REVIEW:   CBC  Recent Labs Lab 08/01/15 0048  WBC 4.6  HGB 10.8*  HCT 33.1*  PLT 223    Chemistries   Recent Labs Lab 07/31/15 1145 08/01/15 0048  NA 138 140  K 3.9 3.9  CL 104 108  CO2 24 24  GLUCOSE 105* 82  BUN 14 17  CREATININE 0.85 0.77  CALCIUM 9.1 8.6*  AST 20  --   ALT 11*  --   ALKPHOS 111  --   BILITOT 0.5  --     Microbiology Results  Results for orders placed or performed during the hospital encounter of 07/31/15  Urine culture     Status: None   Collection Time: 07/31/15  4:37 PM  Result Value Ref Range Status   Specimen Description URINE, RANDOM  Final   Special Requests NONE  Final   Culture 1,000 COLONIES/mL INSIGNIFICANT GROWTH  Final   Report Status 08/02/2015 FINAL  Final   Management plans discussed with the patient, family and they are in agreement.  CODE STATUS: Full code  TOTAL TIME TAKING CARE OF THIS PATIENT: 55 minutes.    [facility], Roizy Harold M.D on 08/03/2015 at 2:44 PM  Between 7am to 6pm - Pager - [phone]  After 6pm go to [email or web address] - password [email or web address] [facility]  [facility]  Office  [phone]  CC: Primary care physician; Mila Merry, MD

## 2015-08-03 NOTE — Care Management Note (Signed)
Case Management Note  Patient Details  Name: Azaryah Heathcock MRN: [id number] Date of Birth: 11-26-1950  Subjective/Objective:               Patient alert oriented and very pleasant, discussed at length her home enviroment and her grandchildren and children.  Patient stated has walker and does fine with husbands assistance. House all on one level.  Has had [facility] prior and would like to continue with this agency. Referral placed with Feliberto Gottron at [facility]. No other CM needs identified.   Action/Plan:   Expected Discharge Date:                  Expected Discharge Plan:  Home w Home Health Services  In-House Referral:     Discharge planning Services  CM Consult  Post Acute Care Choice:    Choice offered to:     DME Arranged:    DME Agency:     HH Arranged:  RN, PT HH Agency:  [facility]  Status of Service:  Completed, signed off  Medicare Important Message Given:    Date Medicare IM Given:    Medicare IM give by:    Date Additional Medicare IM Given:    Additional Medicare Important Message give by:     If discussed at Long Length of Stay Meetings, dates discussed:    Additional Comments:  Adonis Huguenin, RN 08/03/2015, 11:59 AM

## 2015-08-03 NOTE — Progress Notes (Signed)
Pt d/c home; d/c instructions reviewed w/ pt; pt understanding was verbalized; IV removed catheter in tact, gauze dressing applied; all pt questions answered; pt left unit via wheelchair accompanied by staff 

## 2015-08-03 NOTE — Discharge Instructions (Signed)
Altered Mental Status Altered mental status most often refers to an abnormal change in your responsiveness and awareness. It can affect your speech, thought, mobility, memory, attention span, or alertness. It can range from slight confusion to complete unresponsiveness (coma). Altered mental status can be a sign of a serious underlying medical condition. Rapid evaluation and medical treatment is necessary for patients having an altered mental status. CAUSES   Low blood sugar (hypoglycemia) or diabetes.  Severe loss of body fluids (dehydration) or a body salt (electrolyte) imbalance.  A stroke or other neurologic problem, such as dementia or delirium.  A head injury or tumor.  A drug or alcohol overdose.  Exposure to toxins or poisons.  Depression, anxiety, and stress.  A low oxygen level (hypoxia).  An infection.  Blood loss.  Twitching or shaking (seizure).  Heart problems, such as heart attack or heart rhythm problems (arrhythmias).  A body temperature that is too low or too high (hypothermia or hyperthermia). DIAGNOSIS  A diagnosis is based on your history, symptoms, physical and neurologic examinations, and diagnostic tests. Diagnostic tests may include:  Measurement of your blood pressure, pulse, breathing, and oxygen levels (vital signs).  Blood tests.  Urine tests.  X-ray exams.  A computerized magnetic scan (magnetic resonance imaging, MRI).  A computerized X-ray scan (computed tomography, CT scan). TREATMENT  Treatment will depend on the cause. Treatment may include:  Management of an underlying medical or mental health condition.  Critical care or support in the hospital. HOME CARE INSTRUCTIONS   Only take over-the-counter or prescription medicines for pain, discomfort, or fever as directed by your caregiver.  Manage underlying conditions as directed by your caregiver.  Eat a healthy, well-balanced diet to maintain strength.  Join a support group or  prevention program to cope with the condition or trauma that caused the altered mental status. Ask your caregiver to help choose a program that works for you.  Follow up with your caregiver for further examination, therapy, or testing as directed. SEEK MEDICAL CARE IF:   You feel unwell or have chills.  You or your family notice a change in your behavior or your alertness.  You have trouble following your caregiver's treatment plan.  You have questions or concerns. SEEK IMMEDIATE MEDICAL CARE IF:   You have a rapid heartbeat or have chest pain.  You have difficulty breathing.  You have a fever.  You have a headache with a stiff neck.  You cough up blood.  You have blood in your urine or stool.  You have severe agitation or confusion. MAKE SURE YOU:   Understand these instructions.  Will watch your condition.  Will get help right away if you are not doing well or get worse. Document Released: 05/01/2010 Document Revised: 02/03/2012 Document Reviewed: 05/01/2010 [facility] Patient Information 2015 [place], [place]. This information is not intended to replace advice given to you by your health care provider. Make sure you discuss any questions you have with your health care provider.   Follow all MD instructions. Keep all follow up appointments. Take all medications as prescribed. For all questions and/or concerns call your doctor.

## 2015-08-04 ENCOUNTER — Other Ambulatory Visit: Payer: Self-pay | Admitting: *Deleted

## 2015-08-04 NOTE — Progress Notes (Signed)
08/02/15 1704  PT Visit Information  Last PT Received On 08/02/15  Assistance Needed +1  History of Present Illness Cheryl [name]  is a 64 y.o. female who presents with acute encephalopathy. Patient was unable to contribute to her history when she arrived, and so information was taken from her husband and daughter who are present at the bedside. They stated the patient has had episodes similar to this in the past where she will become acutely encephalopathic for anywhere from 2-5 days and prior workups, which have been extensive, have never elucidated a clear cause. Patient has had no documented fevers, no other true infectious symptoms except for some mild complaint yesterday of GI upset without diarrhea or vomiting. She recently just had cardiac and GI workups including a negative stress test and a benign EGD. Her prior workups for these encephalopathic episodes in the past have included MRI and lumbar puncture, all which have returned without any explanation for symptoms.  On evaluation in our ED today the patient's labs are within normal limits, CT head shows chronic white matter changes but no acute abnormality. Patient's vital signs are within normal limits. Patient does have a history of dementia, though she seems to be very functional at baseline. Neurology has evaluated patient and believes that her encephalopathy may be due to overuse of pain medication. Upon arrival pt is AOx3 and able to converse appropriately with therapist. Husband reports that pt is still more confused than her baseline. He reports frequent falls, sometimes up to 6 per week.   Precautions  Precautions Fall  Restrictions  Weight Bearing Restrictions No  Home Living  Family/patient expects to be discharged to: Private residence  Living Arrangements Spouse/significant other  Available Help at Discharge Family;Available PRN/intermittently  Type of Home House  Home Access Ramped entrance  Home Layout One level  Bathroom  Shower/Tub Walk-in Pension scheme manager Yes  Home Equipment Walker - 2 wheels;Walker - 4 wheels;Cane - single point;Shower seat (no BSC)  Prior Function  Level of Independence Independent with assistive device(s)  Comments Assist for IADLs  Communication  Communication No difficulties  Pain Assessment  Pain Assessment No/denies pain  Cognition  Arousal/Alertness Awake/alert  Behavior During Therapy WFL for tasks assessed/performed  Overall Cognitive Status History of cognitive impairments - at baseline  Area of Impairment (Family reports she has not returned to baseline. AOx3)  Upper Extremity Assessment  Upper Extremity Assessment Generalized weakness (UE/LE grossly 3+ to 4-/5 throughout, poor effort)  Lower Extremity Assessment  Lower Extremity Assessment Generalized weakness  Bed Mobility  Overal bed mobility Independent  Transfers  Overall transfer level Needs assistance  Equipment used Rolling walker (2 wheeled)  Transfers Sit to/from Stand  Sit to Stand Min assist  General transfer comment Posterior LOB, poor safety awareness, decreased weight shifting, cues for safe hand placement  Ambulation/Gait  Ambulation/Gait assistance Min guard  Ambulation Distance (Feet) 30 Feet  Assistive device Rolling walker (2 wheeled)  Gait Pattern/deviations Decreased step length - right;Decreased step length - left  General Gait Details Pt with very slow gait, decreased step length bilateral. She is unsteady on her feet but no overt LOB during ambulation. Pt is withdrawn and offers minimal communication. Husband reports that gait is significantly deviated from baseline. Pt refuses further ambulation at this time. Pt is a high risk for future falls  Gait velocity interpretation <1.8 ft/sec, indicative of risk for recurrent falls  Balance  Overall balance assessment Needs assistance  Sitting  balance-Leahy Scale Fair  Standing balance-Leahy Scale Poor   High Level Balance Comments Pt unable to maintain balance in narrowed stance (feet not completely together) without UE support. Within 1-2 seconds she falls backwards on the bed and is unable to self-correct. Pt reports history of frequent falls at baseline but husband states balance is significantly impaired  PT - End of Session  Equipment Utilized During Treatment Gait belt  Activity Tolerance Patient tolerated treatment well  Patient left in bed;with call bell/phone within reach;with bed alarm set;with family/visitor present (Refuses up to chair)  PT Assessment  PT Therapy Diagnosis  Difficulty walking;Generalized weakness;Abnormality of gait  PT Recommendation/Assessment Patient needs continued PT services  PT Problem List Decreased strength;Decreased activity tolerance;Decreased balance;Decreased cognition;Decreased knowledge of use of DME;Decreased safety awareness  Barriers to Discharge Decreased caregiver support  Barriers to Discharge Comments Pt does not have 24/7 assist at home. Husband calls every hour and patient's brother lives nearby to check on her  PT Plan  PT Frequency (ACUTE ONLY) Min 2X/week  PT Treatment/Interventions (ACUTE ONLY) DME instruction;Gait training;Functional mobility training;Therapeutic activities;Therapeutic exercise;Balance training;Neuromuscular re-education;Cognitive remediation;Patient/family education  PT Recommendation  Follow Up Recommendations SNF (Currently refuses, please arrange HH PT and 24/7 assistance)  PT equipment None recommended by PT  Individuals Consulted  Consulted and Agree with Results and Recommendations Family member/caregiver;Patient  Family Member Consulted Husband  Acute Rehab PT Goals  Patient Stated Goal "I want to go home"  PT Goal Formulation With patient/family  Time For Goal Achievement 08/16/15  Potential to Achieve Goals Good  PT Time Calculation  PT Start Time (ACUTE ONLY) 1645  PT Stop Time (ACUTE ONLY) 1700  PT  Time Calculation (min) (ACUTE ONLY) 15 min  PT G-Codes **NOT FOR INPATIENT CLASS**  Functional Assessment Tool Used clinical judgement  Functional Limitation Mobility: Walking and moving around  Mobility: Walking and Moving Around Current Status (Z6109) CK  Mobility: Walking and Moving Around Goal Status (U0454) CJ  PT General Charges  $$ ACUTE PT VISIT 1 Procedure  PT Evaluation  $Initial PT Evaluation Tier I 1 Procedure   Late entry g-codes Lynnea Maizes PT, DPT   8:27 AM 08/04/2015

## 2015-08-04 NOTE — Patient Outreach (Signed)
RNCM called pt as part of transition of care program. Pt pleasant and talkative. Pt able to review her medications with RNCM. Pt was mostly clear on the purpose of each medication. Pt alert and oriented x3 but describes herself as having dementia. Pt states her husband and daughter are her care givers. Pt states her husband fixes her medications for her and her daughter stands by her as she cooks related to being fearful she will fall asleep at the stove and get hurt. Pt stated she thought the home health agency had called but she did not know how to listen to her voicemail so she was unsure. RNCM let pt know she would call the home health agency and ask them to call her back. RNCM explained the transition of care program to her and let her know she would be calling her back next week.   Plan: RNCM will call pt back next week as part of transition of care program.   Ma Rings Marayah Higdon RN, BSN  [facility] Care Management [phone])

## 2015-08-04 NOTE — Patient Outreach (Signed)
RNCM placed call to [facility] to let them know Ms Goon received a message she believes was from them but she was unable to retrieve the call back number from the voice mail and requested they call back. RNCM spoke with Tammy who stated she would relay the message to Fleet Contras who was in a meeting. Fleet Contras was pt's start up point of contact, RNCM gave Tammy pt's cell phone number [phone] per pt request.    Plan: RNCM will call pt next week to follow up with next transition of care.   Costella Hatcher RN, BSN  [facility] Care Management [phone])

## 2015-08-06 DIAGNOSIS — G9340 Encephalopathy, unspecified: Secondary | ICD-10-CM | POA: Diagnosis not present

## 2015-08-06 DIAGNOSIS — J449 Chronic obstructive pulmonary disease, unspecified: Secondary | ICD-10-CM | POA: Diagnosis not present

## 2015-08-06 DIAGNOSIS — K219 Gastro-esophageal reflux disease without esophagitis: Secondary | ICD-10-CM | POA: Diagnosis not present

## 2015-08-06 DIAGNOSIS — F0390 Unspecified dementia without behavioral disturbance: Secondary | ICD-10-CM | POA: Diagnosis not present

## 2015-08-06 DIAGNOSIS — G609 Hereditary and idiopathic neuropathy, unspecified: Secondary | ICD-10-CM | POA: Diagnosis not present

## 2015-08-06 DIAGNOSIS — I2510 Atherosclerotic heart disease of native coronary artery without angina pectoris: Secondary | ICD-10-CM | POA: Diagnosis not present

## 2015-08-06 DIAGNOSIS — I739 Peripheral vascular disease, unspecified: Secondary | ICD-10-CM | POA: Diagnosis not present

## 2015-08-06 DIAGNOSIS — M1990 Unspecified osteoarthritis, unspecified site: Secondary | ICD-10-CM | POA: Diagnosis not present

## 2015-08-06 DIAGNOSIS — I10 Essential (primary) hypertension: Secondary | ICD-10-CM | POA: Diagnosis not present

## 2015-08-07 ENCOUNTER — Ambulatory Visit: Payer: Commercial Managed Care - HMO

## 2015-08-08 DIAGNOSIS — J449 Chronic obstructive pulmonary disease, unspecified: Secondary | ICD-10-CM | POA: Diagnosis not present

## 2015-08-08 DIAGNOSIS — F0390 Unspecified dementia without behavioral disturbance: Secondary | ICD-10-CM | POA: Diagnosis not present

## 2015-08-08 DIAGNOSIS — I2510 Atherosclerotic heart disease of native coronary artery without angina pectoris: Secondary | ICD-10-CM | POA: Diagnosis not present

## 2015-08-08 DIAGNOSIS — G609 Hereditary and idiopathic neuropathy, unspecified: Secondary | ICD-10-CM | POA: Diagnosis not present

## 2015-08-08 DIAGNOSIS — I10 Essential (primary) hypertension: Secondary | ICD-10-CM | POA: Diagnosis not present

## 2015-08-08 DIAGNOSIS — K219 Gastro-esophageal reflux disease without esophagitis: Secondary | ICD-10-CM | POA: Diagnosis not present

## 2015-08-08 DIAGNOSIS — G9340 Encephalopathy, unspecified: Secondary | ICD-10-CM | POA: Diagnosis not present

## 2015-08-08 DIAGNOSIS — M1990 Unspecified osteoarthritis, unspecified site: Secondary | ICD-10-CM | POA: Diagnosis not present

## 2015-08-08 DIAGNOSIS — I739 Peripheral vascular disease, unspecified: Secondary | ICD-10-CM | POA: Diagnosis not present

## 2015-08-10 ENCOUNTER — Inpatient Hospital Stay: Payer: Commercial Managed Care - HMO | Admitting: Family Medicine

## 2015-08-10 ENCOUNTER — Other Ambulatory Visit: Payer: Self-pay | Admitting: *Deleted

## 2015-08-10 NOTE — Patient Outreach (Signed)
RNCM made second call per the transition of care program. Pt stated home health PT has started and the home health nurse had been out to evaluate the pt. Pt stated her MD office called and requested she move her f/u appointment to next week. Pt stating she has all of her medications and she has transportation to her appointment.  Pt denied falling. Pt stated other than her right leg has really been hurting she was doing well . RNCM let pt know she would call her next week and pt could call RNCM if any needs arise.   Plan; RNCM will call pt next week.   Costella Hatcher RN, BSN  [facility] Care Management [phone])

## 2015-08-11 ENCOUNTER — Telehealth: Payer: Self-pay | Admitting: Family Medicine

## 2015-08-11 ENCOUNTER — Other Ambulatory Visit: Payer: Self-pay | Admitting: Family Medicine

## 2015-08-11 DIAGNOSIS — I739 Peripheral vascular disease, unspecified: Secondary | ICD-10-CM | POA: Diagnosis not present

## 2015-08-11 DIAGNOSIS — G609 Hereditary and idiopathic neuropathy, unspecified: Secondary | ICD-10-CM | POA: Diagnosis not present

## 2015-08-11 DIAGNOSIS — J449 Chronic obstructive pulmonary disease, unspecified: Secondary | ICD-10-CM | POA: Diagnosis not present

## 2015-08-11 DIAGNOSIS — K219 Gastro-esophageal reflux disease without esophagitis: Secondary | ICD-10-CM | POA: Diagnosis not present

## 2015-08-11 DIAGNOSIS — G9340 Encephalopathy, unspecified: Secondary | ICD-10-CM | POA: Diagnosis not present

## 2015-08-11 DIAGNOSIS — F0390 Unspecified dementia without behavioral disturbance: Secondary | ICD-10-CM | POA: Diagnosis not present

## 2015-08-11 DIAGNOSIS — I2510 Atherosclerotic heart disease of native coronary artery without angina pectoris: Secondary | ICD-10-CM | POA: Diagnosis not present

## 2015-08-11 DIAGNOSIS — M1990 Unspecified osteoarthritis, unspecified site: Secondary | ICD-10-CM | POA: Diagnosis not present

## 2015-08-11 DIAGNOSIS — I10 Essential (primary) hypertension: Secondary | ICD-10-CM | POA: Diagnosis not present

## 2015-08-11 MED ORDER — CEPHALEXIN 500 MG PO CAPS: 500.0000 mg | ORAL_CAPSULE | Freq: Four times a day (QID) | ORAL | Status: AC

## 2015-08-11 MED ORDER — ALBUTEROL SULFATE HFA 108 (90 BASE) MCG/ACT IN AERS: 2.0000 | INHALATION_SPRAY | RESPIRATORY_TRACT | Status: DC | PRN

## 2015-08-11 NOTE — Telephone Encounter (Signed)
[name] from [facility], notified.

## 2015-08-11 NOTE — Telephone Encounter (Signed)
[name] from [facility] is requesting ventolin and also something to help with the leg pain.

## 2015-08-11 NOTE — Telephone Encounter (Signed)
Show probably has cellulitis. Have send rx for cephalexin to pharmacy, as well as albuterol inhaler. Go to er if any fever or if redness in her leg spreads. Call if redness not improving over the weekend.

## 2015-08-11 NOTE — Telephone Encounter (Signed)
[name] said that pt told her that she is supposed to be on Ventolin but she doesn't have it in her home and would like it sent to [facility] in [place]. Pt is also have having a lot of pain going from right knee to her ankle. [name] stated that the area is slightly red and is warm to the touch. [name] said that pt told her the pain medication she is currently on isn't helping with the pain. Please advise. Thanks TNP

## 2015-08-14 DIAGNOSIS — G9340 Encephalopathy, unspecified: Secondary | ICD-10-CM | POA: Diagnosis not present

## 2015-08-14 DIAGNOSIS — M1990 Unspecified osteoarthritis, unspecified site: Secondary | ICD-10-CM | POA: Diagnosis not present

## 2015-08-14 DIAGNOSIS — K219 Gastro-esophageal reflux disease without esophagitis: Secondary | ICD-10-CM | POA: Diagnosis not present

## 2015-08-14 DIAGNOSIS — J449 Chronic obstructive pulmonary disease, unspecified: Secondary | ICD-10-CM | POA: Diagnosis not present

## 2015-08-14 DIAGNOSIS — I2510 Atherosclerotic heart disease of native coronary artery without angina pectoris: Secondary | ICD-10-CM | POA: Diagnosis not present

## 2015-08-14 DIAGNOSIS — F0390 Unspecified dementia without behavioral disturbance: Secondary | ICD-10-CM | POA: Diagnosis not present

## 2015-08-14 DIAGNOSIS — I10 Essential (primary) hypertension: Secondary | ICD-10-CM | POA: Diagnosis not present

## 2015-08-14 DIAGNOSIS — I739 Peripheral vascular disease, unspecified: Secondary | ICD-10-CM | POA: Diagnosis not present

## 2015-08-14 DIAGNOSIS — G609 Hereditary and idiopathic neuropathy, unspecified: Secondary | ICD-10-CM | POA: Diagnosis not present

## 2015-08-17 ENCOUNTER — Encounter: Payer: Self-pay | Admitting: Family Medicine

## 2015-08-17 ENCOUNTER — Ambulatory Visit (INDEPENDENT_AMBULATORY_CARE_PROVIDER_SITE_OTHER): Payer: Commercial Managed Care - HMO | Admitting: Family Medicine

## 2015-08-17 ENCOUNTER — Ambulatory Visit
Admission: RE | Admit: 2015-08-17 | Discharge: 2015-08-17 | Disposition: A | Discharge status: Home / Self Care | Payer: Commercial Managed Care - HMO | Source: Ambulatory Visit | Attending: Family Medicine | Admitting: Family Medicine

## 2015-08-17 ENCOUNTER — Telehealth: Payer: Self-pay

## 2015-08-17 VITALS — BP 130/70 | HR 88 | Temp 97.4°F | Resp 18 | Ht 61.0 in | Wt 100.0 lb

## 2015-08-17 DIAGNOSIS — G9340 Encephalopathy, unspecified: Secondary | ICD-10-CM | POA: Diagnosis not present

## 2015-08-17 DIAGNOSIS — M1990 Unspecified osteoarthritis, unspecified site: Secondary | ICD-10-CM | POA: Diagnosis not present

## 2015-08-17 DIAGNOSIS — M79604 Pain in right leg: Secondary | ICD-10-CM

## 2015-08-17 DIAGNOSIS — K219 Gastro-esophageal reflux disease without esophagitis: Secondary | ICD-10-CM | POA: Diagnosis not present

## 2015-08-17 DIAGNOSIS — I10 Essential (primary) hypertension: Secondary | ICD-10-CM | POA: Diagnosis not present

## 2015-08-17 DIAGNOSIS — G609 Hereditary and idiopathic neuropathy, unspecified: Secondary | ICD-10-CM | POA: Diagnosis not present

## 2015-08-17 DIAGNOSIS — J449 Chronic obstructive pulmonary disease, unspecified: Secondary | ICD-10-CM | POA: Diagnosis not present

## 2015-08-17 DIAGNOSIS — F0390 Unspecified dementia without behavioral disturbance: Secondary | ICD-10-CM | POA: Diagnosis not present

## 2015-08-17 DIAGNOSIS — I2510 Atherosclerotic heart disease of native coronary artery without angina pectoris: Secondary | ICD-10-CM | POA: Diagnosis not present

## 2015-08-17 DIAGNOSIS — I739 Peripheral vascular disease, unspecified: Secondary | ICD-10-CM | POA: Diagnosis not present

## 2015-08-17 DIAGNOSIS — N390 Urinary tract infection, site not specified: Secondary | ICD-10-CM | POA: Diagnosis not present

## 2015-08-17 DIAGNOSIS — R110 Nausea: Secondary | ICD-10-CM | POA: Diagnosis not present

## 2015-08-17 NOTE — Progress Notes (Signed)
Patient: Cheryl [name] Female    DOB: 19-Dec-1950   64 y.o.   MRN: [id number] Visit Date: 08/17/2015  Today's Provider: Mila Merry, MD   Chief Complaint  Patient presents with  . Hospitalization Follow-up   Subjective:    HPI  Follow up Hospitalization  Patient was admitted to [facility] on 07/31/2015 and discharged on 08/03/2015. She was treated for Encephalopathy. She had extensive negative neurological workup Treatment for this included  Discontinuing Fentanyl, Lyrica and Tramadol while in the Hospital. At discharge patient was advised to continue to keep Fentanyl on hold as symptoms were thought to be due to excessive opioids.   States she restarted taking Fentanyl 1 day after being discharged from the hospital and has had no episodes of confusion since discharge.  She reports this condition is Improved.    Right leg pain Home health nurse call on 08/11/15 stating right lower leg as painful, red and warm to touch. We sent in rx for cephalexin which she has been taking, but she states that the pain in her right leg has worsened since then. She has had no fevers or chills. Pain goes around her entire lower leg from just below her knee to her ankle. It seems a little worse in the back of her leg. She has pain at rest, when she stands, and when she walks, although it gets a little better after she has been walking around for awhile.  ------------------------------------------------------------------------------------     Allergies  Allergen Reactions  . Percocet [Oxycodone-Acetaminophen] Hives  . Aspirin Nausea Only and Other (See Comments)    Reaction:  GI upset   . Codeine Nausea Only and Other (See Comments)    Reaction:  GI upset   . Morphine Other (See Comments)    Reaction:  Unknown   . Pregabalin Other (See Comments)    Reaction:  Makes pt sleep excessively.    . Propoxyphene Other (See Comments)    Reaction:  GI upset   . Sulfa Antibiotics  Rash and Other (See Comments)    Reaction:  GI upset    Previous Medications   ACETAMINOPHEN (TYLENOL) 500 MG TABLET    Take 1,000 mg by mouth every 6 (six) hours as needed for mild pain or headache.    ALBUTEROL (PROVENTIL HFA;VENTOLIN HFA) 108 (90 BASE) MCG/ACT INHALER    Inhale 2 puffs into the lungs every 4 (four) hours as needed for wheezing or shortness of breath.   ALPRAZOLAM (XANAX) 0.5 MG TABLET    Take 0.5 mg by mouth daily as needed for anxiety.    CELECOXIB (CELEBREX) 200 MG CAPSULE    Take 200 mg by mouth daily.    CEPHALEXIN (KEFLEX) 500 MG CAPSULE    Take 1 capsule (500 mg total) by mouth 4 (four) times daily.   CLOPIDOGREL (PLAVIX) 75 MG TABLET    Take 75 mg by mouth daily.   CYCLOBENZAPRINE (FLEXERIL) 5 MG TABLET    Take 5-10 mg by mouth every 8 (eight) hours as needed for muscle spasms.   DIPHENOXYLATE-ATROPINE (LOMOTIL) 2.5-0.025 MG PER TABLET    Take 1 tablet by mouth 2 (two) times daily.   DONEPEZIL (ARICEPT) 10 MG TABLET    Take 10 mg by mouth at bedtime.   ESTROPIPATE (OGEN) 0.75 MG TABLET    Take 0.75 mg by mouth daily.   FENTANYL (DURAGESIC - DOSED MCG/HR) 50 MCG/HR    Place 50 mcg onto the skin every  3 (three) days.   FLUTICASONE-SALMETEROL (ADVAIR) 250-50 MCG/DOSE AEPB    Inhale 1 puff into the lungs 2 (two) times daily.   FUROSEMIDE (LASIX) 40 MG TABLET    Take 40 mg by mouth daily.   LANSOPRAZOLE (PREVACID) 30 MG CAPSULE    Take 30 mg by mouth 2 (two) times daily.   MUPIROCIN CREAM (BACTROBAN) 2 %    Apply 1 application topically 3 (three) times daily.   PREGABALIN (LYRICA) 150 MG CAPSULE    Take 75 mg by mouth 2 (two) times daily.    SIMVASTATIN (ZOCOR) 10 MG TABLET    Take 10 mg by mouth at bedtime.    SUMATRIPTAN (IMITREX) 25 MG TABLET    Take 25 mg by mouth as needed for migraine. May repeat in 2 hours if headache persists or recurs.   TIOTROPIUM (SPIRIVA) 18 MCG INHALATION CAPSULE    Place 18 mcg into inhaler and inhale daily.   TRAMADOL (ULTRAM) 50 MG TABLET     Take 100 mg by mouth 4 (four) times daily.     Review of Systems  Constitutional: Negative for fever, chills, appetite change and fatigue.  Respiratory: Negative for chest tightness and shortness of breath.   Cardiovascular: Positive for leg swelling (right leg). Negative for chest pain and palpitations.  Gastrointestinal: Negative for nausea, vomiting and abdominal pain.  Skin: Positive for color change ( redness on right lower leg).  Neurological: Negative for dizziness and weakness.  Psychiatric/Behavioral: Negative for confusion.    Social History  Substance Use Topics  . Smoking status: Current Every Day Smoker -- 1.00 packs/day    Types: Cigarettes  . Smokeless tobacco: Not on file  . Alcohol Use: No   Objective:   BP 130/70 mmHg  Pulse 88  Temp(Src) 97.4 F (36.3 C) (Oral)  Resp 18  Ht  (1.549 m)  Wt 100 lb (45.36 kg)  BMI 18.90 kg/m2  Physical Exam   General Appearance:    Alert, cooperative, no distress  Eyes:    PERRL, conjunctiva/corneas clear, EOM's intact       Lungs:     Clear to auscultation bilaterally, respirations unlabored  Heart:    Regular rate and rhythm  Neurologic:   Awake, alert, oriented x 3. No apparent focal neurological           defect.   MS:   Right anterior lower leg with 1+ edema, about 3x2cm patch of faint erythema. Diffusely tender over anterior and posterior aspect of lower leg from area just below knee to upper ankle. No cords palpated. Negative Homan's sign.        Assessment & Plan:     1. Right leg pain Is erythematous, but no improving on cephalexin. Tenderness is worse posterior lower leg and need to rule out DVT.  - US Venous Img Lower Unilateral Right; Future  If no DVT will change antiobiotic.   2. Encephalopathy Resolved. No clear etiology. Is back on previous pain medication regiment. She is to follow up with Dr. Modena Morrow.        Mila Merry, MD  [facility]

## 2015-08-17 NOTE — Telephone Encounter (Signed)
FYI Radiology called with report for RLL Venous Doppler and the result was negative for DVT. Advised radiologist to D/C pt and we will call pt with results. Allene Dillon, CMA

## 2015-08-18 ENCOUNTER — Telehealth: Payer: Self-pay | Admitting: *Deleted

## 2015-08-18 DIAGNOSIS — M79661 Pain in right lower leg: Secondary | ICD-10-CM

## 2015-08-18 NOTE — Telephone Encounter (Signed)
Patient notified of results. Expressed understanding. X-ray ordered.

## 2015-08-18 NOTE — Telephone Encounter (Signed)
-----   Message from Malva Limes, MD sent at 08/18/2015  8:14 AM EDT ----- No blood clot. Need XR right lower leg due to leg pain. Please advise and enter order. Thanks.

## 2015-08-21 DIAGNOSIS — I10 Essential (primary) hypertension: Secondary | ICD-10-CM | POA: Diagnosis not present

## 2015-08-21 DIAGNOSIS — G9340 Encephalopathy, unspecified: Secondary | ICD-10-CM | POA: Diagnosis not present

## 2015-08-21 DIAGNOSIS — M1990 Unspecified osteoarthritis, unspecified site: Secondary | ICD-10-CM | POA: Diagnosis not present

## 2015-08-21 DIAGNOSIS — I2510 Atherosclerotic heart disease of native coronary artery without angina pectoris: Secondary | ICD-10-CM | POA: Diagnosis not present

## 2015-08-21 DIAGNOSIS — F0390 Unspecified dementia without behavioral disturbance: Secondary | ICD-10-CM | POA: Diagnosis not present

## 2015-08-21 DIAGNOSIS — K219 Gastro-esophageal reflux disease without esophagitis: Secondary | ICD-10-CM | POA: Diagnosis not present

## 2015-08-21 DIAGNOSIS — J449 Chronic obstructive pulmonary disease, unspecified: Secondary | ICD-10-CM | POA: Diagnosis not present

## 2015-08-21 DIAGNOSIS — G609 Hereditary and idiopathic neuropathy, unspecified: Secondary | ICD-10-CM | POA: Diagnosis not present

## 2015-08-21 DIAGNOSIS — I739 Peripheral vascular disease, unspecified: Secondary | ICD-10-CM | POA: Diagnosis not present

## 2015-08-22 DIAGNOSIS — I739 Peripheral vascular disease, unspecified: Secondary | ICD-10-CM | POA: Diagnosis not present

## 2015-08-22 DIAGNOSIS — M1990 Unspecified osteoarthritis, unspecified site: Secondary | ICD-10-CM | POA: Diagnosis not present

## 2015-08-22 DIAGNOSIS — J449 Chronic obstructive pulmonary disease, unspecified: Secondary | ICD-10-CM | POA: Diagnosis not present

## 2015-08-22 DIAGNOSIS — I2510 Atherosclerotic heart disease of native coronary artery without angina pectoris: Secondary | ICD-10-CM | POA: Diagnosis not present

## 2015-08-22 DIAGNOSIS — I10 Essential (primary) hypertension: Secondary | ICD-10-CM | POA: Diagnosis not present

## 2015-08-22 DIAGNOSIS — K219 Gastro-esophageal reflux disease without esophagitis: Secondary | ICD-10-CM | POA: Diagnosis not present

## 2015-08-22 DIAGNOSIS — F0390 Unspecified dementia without behavioral disturbance: Secondary | ICD-10-CM | POA: Diagnosis not present

## 2015-08-22 DIAGNOSIS — G609 Hereditary and idiopathic neuropathy, unspecified: Secondary | ICD-10-CM | POA: Diagnosis not present

## 2015-08-22 DIAGNOSIS — G9340 Encephalopathy, unspecified: Secondary | ICD-10-CM | POA: Diagnosis not present

## 2015-08-23 ENCOUNTER — Other Ambulatory Visit: Payer: Self-pay | Admitting: *Deleted

## 2015-08-23 ENCOUNTER — Ambulatory Visit
Admission: RE | Admit: 2015-08-23 | Discharge: 2015-08-23 | Disposition: A | Discharge status: Home / Self Care | Payer: Commercial Managed Care - HMO | Source: Ambulatory Visit | Attending: Family Medicine | Admitting: Family Medicine

## 2015-08-23 ENCOUNTER — Telehealth: Payer: Self-pay | Admitting: Family Medicine

## 2015-08-23 DIAGNOSIS — M79661 Pain in right lower leg: Secondary | ICD-10-CM | POA: Diagnosis not present

## 2015-08-23 NOTE — Telephone Encounter (Signed)
Pt's husband called about her leg xray that she had to day, wanting to know if we have gotten any results back yet.  CB # [phone]  Cheryl [name]

## 2015-08-23 NOTE — Patient Outreach (Addendum)
Transition of care call (third call).  Spoke with pt, HIPPA verified.   Informed pt this RN CM covering for Janci RN who has done the  previous transition of care calls with her.   RN CM inquired about pain in right lower leg to which pt reports  had xray done today- no results yet, to let me know.   Pt reports does not have a blood clot, right leg still hurting/swells/red/warm to touch.  Pt reports taking all of her pain medications - Tylenol, Tramadol, Celebrex, Fentanyl patch- helps some.  Pt states she is to f/u with pain MD next month.   Informed pt Ma Rings RN will be f/u with her again next week (ongoing transition of care).     Plan to give report to [facility] RN  Upon her return.     Shayne Alken.   Yena Tisby RN CCM [facility] Care Management  [phone]

## 2015-08-23 NOTE — Telephone Encounter (Signed)
Please advise xray results.

## 2015-08-24 ENCOUNTER — Other Ambulatory Visit: Payer: Self-pay | Admitting: Family Medicine

## 2015-08-24 DIAGNOSIS — F0390 Unspecified dementia without behavioral disturbance: Secondary | ICD-10-CM | POA: Diagnosis not present

## 2015-08-24 DIAGNOSIS — G629 Polyneuropathy, unspecified: Secondary | ICD-10-CM | POA: Diagnosis not present

## 2015-08-24 DIAGNOSIS — K219 Gastro-esophageal reflux disease without esophagitis: Secondary | ICD-10-CM | POA: Diagnosis not present

## 2015-08-24 DIAGNOSIS — I2510 Atherosclerotic heart disease of native coronary artery without angina pectoris: Secondary | ICD-10-CM | POA: Diagnosis not present

## 2015-08-24 DIAGNOSIS — R29898 Other symptoms and signs involving the musculoskeletal system: Secondary | ICD-10-CM | POA: Diagnosis not present

## 2015-08-24 DIAGNOSIS — G609 Hereditary and idiopathic neuropathy, unspecified: Secondary | ICD-10-CM | POA: Diagnosis not present

## 2015-08-24 DIAGNOSIS — J449 Chronic obstructive pulmonary disease, unspecified: Secondary | ICD-10-CM | POA: Diagnosis not present

## 2015-08-24 DIAGNOSIS — I10 Essential (primary) hypertension: Secondary | ICD-10-CM | POA: Diagnosis not present

## 2015-08-24 DIAGNOSIS — M1990 Unspecified osteoarthritis, unspecified site: Secondary | ICD-10-CM | POA: Diagnosis not present

## 2015-08-24 DIAGNOSIS — I739 Peripheral vascular disease, unspecified: Secondary | ICD-10-CM | POA: Diagnosis not present

## 2015-08-24 DIAGNOSIS — G9340 Encephalopathy, unspecified: Secondary | ICD-10-CM | POA: Diagnosis not present

## 2015-08-24 MED ORDER — LEVOFLOXACIN 500 MG PO TABS: 500.0000 mg | ORAL_TABLET | Freq: Every day | ORAL | Status: AC

## 2015-08-24 NOTE — Telephone Encounter (Signed)
Have sent rx for levaquin to pharmacy. Follow up in 10-14 days

## 2015-08-24 NOTE — Telephone Encounter (Signed)
Patient advised and verbally voiced understanding. Follow up scheduled 09/06/2015 at 8:45am.

## 2015-08-24 NOTE — Telephone Encounter (Signed)
Xray is normal. If area is red at all then we need to get her on another antibiotic.

## 2015-08-24 NOTE — Telephone Encounter (Signed)
Patient notified of results. Patient stated that she does have some redness on her leg.

## 2015-08-29 DIAGNOSIS — I2510 Atherosclerotic heart disease of native coronary artery without angina pectoris: Secondary | ICD-10-CM | POA: Diagnosis not present

## 2015-08-29 DIAGNOSIS — G609 Hereditary and idiopathic neuropathy, unspecified: Secondary | ICD-10-CM | POA: Diagnosis not present

## 2015-08-29 DIAGNOSIS — I10 Essential (primary) hypertension: Secondary | ICD-10-CM | POA: Diagnosis not present

## 2015-08-29 DIAGNOSIS — G9340 Encephalopathy, unspecified: Secondary | ICD-10-CM | POA: Diagnosis not present

## 2015-08-29 DIAGNOSIS — F0390 Unspecified dementia without behavioral disturbance: Secondary | ICD-10-CM | POA: Diagnosis not present

## 2015-08-29 DIAGNOSIS — I739 Peripheral vascular disease, unspecified: Secondary | ICD-10-CM | POA: Diagnosis not present

## 2015-08-29 DIAGNOSIS — M1990 Unspecified osteoarthritis, unspecified site: Secondary | ICD-10-CM | POA: Diagnosis not present

## 2015-08-29 DIAGNOSIS — K219 Gastro-esophageal reflux disease without esophagitis: Secondary | ICD-10-CM | POA: Diagnosis not present

## 2015-08-29 DIAGNOSIS — J449 Chronic obstructive pulmonary disease, unspecified: Secondary | ICD-10-CM | POA: Diagnosis not present

## 2015-08-31 DIAGNOSIS — M1990 Unspecified osteoarthritis, unspecified site: Secondary | ICD-10-CM | POA: Diagnosis not present

## 2015-08-31 DIAGNOSIS — I10 Essential (primary) hypertension: Secondary | ICD-10-CM | POA: Diagnosis not present

## 2015-08-31 DIAGNOSIS — F0390 Unspecified dementia without behavioral disturbance: Secondary | ICD-10-CM | POA: Diagnosis not present

## 2015-08-31 DIAGNOSIS — G609 Hereditary and idiopathic neuropathy, unspecified: Secondary | ICD-10-CM | POA: Diagnosis not present

## 2015-08-31 DIAGNOSIS — I2510 Atherosclerotic heart disease of native coronary artery without angina pectoris: Secondary | ICD-10-CM | POA: Diagnosis not present

## 2015-08-31 DIAGNOSIS — G9340 Encephalopathy, unspecified: Secondary | ICD-10-CM | POA: Diagnosis not present

## 2015-08-31 DIAGNOSIS — I739 Peripheral vascular disease, unspecified: Secondary | ICD-10-CM | POA: Diagnosis not present

## 2015-08-31 DIAGNOSIS — J449 Chronic obstructive pulmonary disease, unspecified: Secondary | ICD-10-CM | POA: Diagnosis not present

## 2015-08-31 DIAGNOSIS — K219 Gastro-esophageal reflux disease without esophagitis: Secondary | ICD-10-CM | POA: Diagnosis not present

## 2015-09-01 ENCOUNTER — Other Ambulatory Visit: Payer: Self-pay | Admitting: *Deleted

## 2015-09-01 ENCOUNTER — Encounter: Payer: Self-pay | Admitting: *Deleted

## 2015-09-01 DIAGNOSIS — Z719 Counseling, unspecified: Secondary | ICD-10-CM

## 2015-09-01 NOTE — Patient Outreach (Signed)
[facility]) Care Management  09/01/2015  Briahnna Harries 04-14-1951 [id number]   Request from Janci Minor, RN to assign RN Health Coach, assigned Fleeta Emmer, RN.  Thanks, Corrie Mckusick. Sharlee Blew [facility] Care Management [facility] CM Assistant Phone: [phone] Fax: [phone]

## 2015-09-01 NOTE — Patient Outreach (Signed)
Final transition of care call made. Pt in good spirits stating her leg pain has gotten much better. She stated she was still being followed by home health nurse and PT. She admits to falling "a couple of times" in the last few weeks. RNCM educated pt about fall safety, pt agreed she had all precautions in place but stated she just felt clumsy. Pt stated she was unable to do med review with RNCM related to her husband was not home who manages all of her medications. She did state she had no medication changes. Pt was agreeable to health coach continuing to call her and give her more education about COPD, she stated that was helpful in the past. Encouraged pt to speak with [facility] RN about the need for pull-ups, and pt stated she would.  Plan: RNCM will dc pt from case management. RNCM will refer pt to disease management. RNCM will make MD aware of pt status change.  Costella Hatcher RN, BSN  [facility] Care Management [phone])

## 2015-09-04 ENCOUNTER — Ambulatory Visit: Payer: Commercial Managed Care - HMO | Attending: Pain Medicine | Admitting: Pain Medicine

## 2015-09-04 ENCOUNTER — Encounter: Payer: Self-pay | Admitting: Pain Medicine

## 2015-09-04 VITALS — BP 122/61 | HR 81 | Temp 98.0°F | Resp 18 | Ht 61.0 in | Wt 100.0 lb

## 2015-09-04 DIAGNOSIS — Z79899 Other long term (current) drug therapy: Secondary | ICD-10-CM

## 2015-09-04 DIAGNOSIS — T45515A Adverse effect of anticoagulants, initial encounter: Secondary | ICD-10-CM

## 2015-09-04 DIAGNOSIS — F17210 Nicotine dependence, cigarettes, uncomplicated: Secondary | ICD-10-CM | POA: Insufficient documentation

## 2015-09-04 DIAGNOSIS — G894 Chronic pain syndrome: Secondary | ICD-10-CM

## 2015-09-04 DIAGNOSIS — M545 Low back pain, unspecified: Secondary | ICD-10-CM

## 2015-09-04 DIAGNOSIS — J449 Chronic obstructive pulmonary disease, unspecified: Secondary | ICD-10-CM | POA: Insufficient documentation

## 2015-09-04 DIAGNOSIS — G629 Polyneuropathy, unspecified: Secondary | ICD-10-CM

## 2015-09-04 DIAGNOSIS — G609 Hereditary and idiopathic neuropathy, unspecified: Secondary | ICD-10-CM | POA: Diagnosis not present

## 2015-09-04 DIAGNOSIS — F1190 Opioid use, unspecified, uncomplicated: Secondary | ICD-10-CM | POA: Diagnosis not present

## 2015-09-04 DIAGNOSIS — M549 Dorsalgia, unspecified: Secondary | ICD-10-CM | POA: Insufficient documentation

## 2015-09-04 DIAGNOSIS — F419 Anxiety disorder, unspecified: Secondary | ICD-10-CM | POA: Diagnosis not present

## 2015-09-04 DIAGNOSIS — Z79891 Long term (current) use of opiate analgesic: Secondary | ICD-10-CM | POA: Diagnosis not present

## 2015-09-04 DIAGNOSIS — Z5181 Encounter for therapeutic drug level monitoring: Secondary | ICD-10-CM | POA: Diagnosis not present

## 2015-09-04 DIAGNOSIS — Z7902 Long term (current) use of antithrombotics/antiplatelets: Secondary | ICD-10-CM | POA: Insufficient documentation

## 2015-09-04 DIAGNOSIS — M546 Pain in thoracic spine: Secondary | ICD-10-CM | POA: Diagnosis not present

## 2015-09-04 DIAGNOSIS — K219 Gastro-esophageal reflux disease without esophagitis: Secondary | ICD-10-CM | POA: Diagnosis not present

## 2015-09-04 DIAGNOSIS — G43909 Migraine, unspecified, not intractable, without status migrainosus: Secondary | ICD-10-CM | POA: Diagnosis not present

## 2015-09-04 DIAGNOSIS — I2510 Atherosclerotic heart disease of native coronary artery without angina pectoris: Secondary | ICD-10-CM | POA: Diagnosis not present

## 2015-09-04 DIAGNOSIS — M1990 Unspecified osteoarthritis, unspecified site: Secondary | ICD-10-CM | POA: Diagnosis not present

## 2015-09-04 DIAGNOSIS — F1120 Opioid dependence, uncomplicated: Secondary | ICD-10-CM

## 2015-09-04 DIAGNOSIS — F0390 Unspecified dementia without behavioral disturbance: Secondary | ICD-10-CM | POA: Diagnosis not present

## 2015-09-04 DIAGNOSIS — G9340 Encephalopathy, unspecified: Secondary | ICD-10-CM | POA: Diagnosis not present

## 2015-09-04 DIAGNOSIS — D6959 Other secondary thrombocytopenia: Secondary | ICD-10-CM

## 2015-09-04 DIAGNOSIS — M5441 Lumbago with sciatica, right side: Secondary | ICD-10-CM

## 2015-09-04 DIAGNOSIS — M5416 Radiculopathy, lumbar region: Secondary | ICD-10-CM

## 2015-09-04 DIAGNOSIS — M5442 Lumbago with sciatica, left side: Secondary | ICD-10-CM

## 2015-09-04 DIAGNOSIS — I10 Essential (primary) hypertension: Secondary | ICD-10-CM | POA: Diagnosis not present

## 2015-09-04 DIAGNOSIS — G8929 Other chronic pain: Secondary | ICD-10-CM

## 2015-09-04 DIAGNOSIS — I739 Peripheral vascular disease, unspecified: Secondary | ICD-10-CM | POA: Diagnosis not present

## 2015-09-04 MED ORDER — TRAMADOL HCL 50 MG PO TABS: 100.0000 mg | ORAL_TABLET | Freq: Four times a day (QID) | ORAL | Status: DC | PRN

## 2015-09-04 MED ORDER — FENTANYL 50 MCG/HR TD PT72: 50.0000 ug | MEDICATED_PATCH | TRANSDERMAL | Status: DC

## 2015-09-04 MED ORDER — PREGABALIN 150 MG PO CAPS: 150.0000 mg | ORAL_CAPSULE | Freq: Two times a day (BID) | ORAL | Status: DC

## 2015-09-04 MED ORDER — MELOXICAM 15 MG PO TABS: 15.0000 mg | ORAL_TABLET | Freq: Every day | ORAL | Status: DC

## 2015-09-04 NOTE — Patient Instructions (Signed)

## 2015-09-04 NOTE — Progress Notes (Signed)
Safety precautions to be maintained throughout the outpatient stay will include: orient to surroundings, keep bed in low position, maintain call bell within reach at all times, provide assistance with transfer out of bed and ambulation.  Pill remaining 240/240 Fentanyl patch remaining 12

## 2015-09-04 NOTE — Progress Notes (Signed)
Patient's Name: Cheryl [name] MRN: [id number] DOB: May 23, 1951 DOS: 09/04/2015  Primary Reason(s) for Visit: Encounter for Medication Management. CC: Back Pain   HPI:   [name] is a 64 y.o. year old, female patient, who returns today as an established patient. She has Essential hypertension; GERD (gastroesophageal reflux disease); Hyperlipemia; BP (high blood pressure); Absolute anemia; Anxiety; Airway hyperreactivity; Back pain, thoracic; Chronic LBP; Arteriosclerosis of coronary artery; Diaphoresis; Bleeding from the nose; Excessive falling; Alteration in bowel elimination: incontinence; Hematoma; H/O gastrointestinal disease; Cannot sleep; Decreased testosterone level; Leg weakness; Mild cognitive disorder; Menopausal symptom; Migraine; Neuropathy (HCC); Fecal occult blood test positive; OP (osteoporosis); Episodic paroxysmal anxiety disorder; Disorder of peripheral nervous system (HCC); Peripheral blood vessel disorder (HCC); Inflammation of sacroiliac joint (HCC); Breathlessness on exertion; Accumulation of fluid in tissues; Episode of syncope; Compulsive tobacco user syndrome; Absence of bladder continence; Avitaminosis D; Weight loss; Cellulitis of leg; Acute encephalopathy; COPD (chronic obstructive pulmonary disease) (HCC); Dementia; Gastrointestinal complaints, nonspecific; Lumbar radicular pain; Chronic low back pain; Other long term (current) drug therapy; Encounter for long-term opiate analgesic use; Encounter for therapeutic drug level monitoring; Opiate use; Uncomplicated opioid dependence (HCC); Chronic pain syndrome; Hypercholesteremia; Peripheral nerve disease (HCC); Peripheral vascular disease (HCC); and Platelet inhibition due to Plavix on her problem list.. Her primarily concern today is the Back Pain   The patient comes in today indicating that she was recently admitted to the hospital due to somnolence. Based on the way that they describe the event, it would seem that it is  secondary to dehydration. They indicate that this happens about once a year. They also indicated that a resolve with appropriate hydration. Today I have taken the time to explain to the son how to evaluate his mother for possible dehydration.  Pharmacotherapy Review: Side-effects or Adverse reactions: None reported. Effectiveness: Described as relatively effective, allowing for increase in activities of daily living (ADL). Onset of action: Within expected pharmacological parameters. Duration of action: Within normal limits for medication. Peak effect: Timing and results are as within normal expected parameters. [place] PMP: Compliant with practice rules and regulations. DST: Compliant with practice rules and regulations. Lab work: No new labs ordered by our practice. Treatment compliance: Compliant. Substance Use Disorder (SUD) Risk Level: Low Planned course of action: Continue therapy as is.  Allergies: [name] is allergic to percocet; aspirin; codeine; morphine; pregabalin; propoxyphene; and sulfa antibiotics.  Meds: The patient has a current medication list which includes the following prescription(s): acetaminophen, advair diskus, albuterol, alprazolam, clopidogrel, cyclobenzaprine, diphenoxylate-atropine, donepezil, estropipate, fentanyl, furosemide, lansoprazole, meloxicam, mupirocin cream, pantoprazole, simvastatin, sumatriptan, tiotropium, tramadol, fentanyl, fentanyl, gabapentin, magnesium oxide, and pregabalin. Requested Prescriptions   Signed Prescriptions Disp Refills  . traMADol (ULTRAM) 50 MG tablet 240 tablet 2    Sig: Take 2 tablets (100 mg total) by mouth every 6 (six) hours as needed.  . meloxicam (MOBIC) 15 MG tablet 30 tablet 2    Sig: Take 1 tablet (15 mg total) by mouth daily.  . fentaNYL (DURAGESIC - DOSED MCG/HR) 50 MCG/HR 10 patch 0    Sig: Place 1 patch (50 mcg total) onto the skin every 3 (three) days.  . fentaNYL (DURAGESIC) 50 MCG/HR 10 patch 0    Sig: Place 1  patch (50 mcg total) onto the skin every 3 (three) days.  . fentaNYL (DURAGESIC) 50 MCG/HR 10 patch 0    Sig: Place 1 patch (50 mcg total) onto the skin every 3 (three) days.  . pregabalin (LYRICA) 150 MG  capsule 60 capsule 2    Sig: Take 1 capsule (150 mg total) by mouth 2 (two) times daily.    ROS: Constitutional: Afebrile, no chills, well hydrated and well nourished Gastrointestinal: negative Musculoskeletal:negative Neurological: negative Behavioral/Psych: negative  PFSH: Medical:  [name]  has a past medical history of COPD (chronic obstructive pulmonary disease) (HCC); Hypertension; Arthritis; Hyperlipidemia; GERD (gastroesophageal reflux disease); Neuropathy (HCC) (2010); Oxygen deficiency; Coronary artery disease; Headache; Anxiety; Peripheral vascular disease (HCC); Vitamin D deficiency; Migraines; Chronic pain; DVT (deep venous thrombosis) (HCC); Pneumonia; Osteoporosis; and Allergy. Family: family history includes Arthritis in her mother; Cancer in her brother, brother, and mother; Diabetes in her brother, mother, and sister; Heart disease in her father and mother. Surgical:  has past surgical history that includes Appendectomy; Spine surgery; Foot surgery (Bilateral); cardiac catherization (10/31/2009); abdomnal aortic stent (05/30/2008); Abdominal hysterectomy (1975); Cholecystectomy (1972); Cervical fusion (C5 - 6/C6-7); Appendectomy; Colonoscopy with propofol (N/A, 07/27/2015); and Esophagogastroduodenoscopy (egd) with propofol (N/A, 07/27/2015). Tobacco:  reports that she has been smoking Cigarettes.  She has a 50 pack-year smoking history. She does not have any smokeless tobacco history on file. Alcohol:  reports that she does not drink alcohol. Drug:  reports that she does not use illicit drugs.  Physical Exam: Vitals:  Today's Vitals   09/04/15 1016 09/04/15 1020  BP: 122/61   Pulse: 81   Temp: 98 F (36.7 C)   TempSrc: Oral   Resp: 18   Height:  (1.549 m)    Weight: 100 lb (45.36 kg)   SpO2: 100%   PainSc:  3   Calculated BMI: Body mass index is 18.9 kg/(m^2). General appearance: alert, cooperative, appears older than stated age and mild distress Eyes: conjunctivae/corneas clear. PERRL, EOM's intact. Fundi benign. Lungs: No evidence respiratory distress, no audible rales or ronchi and no use of accessory muscles of respiration Neck: no adenopathy, no carotid bruit, no JVD, supple, symmetrical, trachea midline and thyroid not enlarged, symmetric, no tenderness/mass/nodules Back: The patient has significant kyphosis of the thoracic spine. No acute pain. Extremities: extremities normal, atraumatic, no cyanosis or edema Pulses: 2+ and symmetric Skin: Skin color, texture, turgor normal. No rashes or lesions Neurologic: Gait: Antalgic    Assessment: Encounter Diagnosis:  Primary Diagnosis: Chronic pain syndrome [G89.4]  Plan: Lareina was seen today for back pain.  Diagnoses and all orders for this visit:  Chronic pain syndrome -     traMADol (ULTRAM) 50 MG tablet; Take 2 tablets (100 mg total) by mouth every 6 (six) hours as needed. -     fentaNYL (DURAGESIC - DOSED MCG/HR) 50 MCG/HR; Place 1 patch (50 mcg total) onto the skin every 3 (three) days. -     fentaNYL (DURAGESIC) 50 MCG/HR; Place 1 patch (50 mcg total) onto the skin every 3 (three) days. -     fentaNYL (DURAGESIC) 50 MCG/HR; Place 1 patch (50 mcg total) onto the skin every 3 (three) days.  Uncomplicated opioid dependence (HCC)  Opiate use  Encounter for therapeutic drug level monitoring  Encounter for long-term opiate analgesic use -     Drugs of abuse screen w/o alc, rtn urine-sln; Future  Other long term (current) drug therapy  Chronic low back pain  Lumbar radicular pain  Neuropathy (HCC) -     Discontinue: pregabalin (LYRICA) 150 MG capsule; Take 1 capsule (150 mg total) by mouth 2 (two) times daily. -     pregabalin (LYRICA) 150 MG capsule; Take 1 capsule (150 mg  total)  by mouth 2 (two) times daily.  Thoracic back pain, unspecified back pain laterality -     meloxicam (MOBIC) 15 MG tablet; Take 1 tablet (15 mg total) by mouth daily.  Chronic LBP  Platelet inhibition due to Plavix     Patient Instructions  Pain Management Discharge Instructions  General Discharge Instructions :  If you need to reach your doctor call: Monday-Friday 8:00 am - 4:00 pm at [phone] or toll free [phone].  After clinic hours [phone] to have operator reach doctor.  Bring all of your medication bottles to all your appointments in the pain clinic.  To cancel or reschedule your appointment with Pain Management please remember to call 24 hours in advance to avoid a fee.  Refer to the educational materials which you have been given on: General Risks, I had my Procedure. Discharge Instructions, Post Sedation.  Post Procedure Instructions:  The drugs you were given will stay in your system until tomorrow, so for the next 24 hours you should not drive, make any legal decisions or drink any alcoholic beverages.  You may eat anything you prefer, but it is better to start with liquids then soups and crackers, and gradually work up to solid foods.  Please notify your doctor immediately if you have any unusual bleeding, trouble breathing or pain that is not related to your normal pain.  Depending on the type of procedure that was done, some parts of your body may feel week and/or numb.  This usually clears up by tonight or the next day.  Walk with the use of an assistive device or accompanied by an adult for the 24 hours.  You may use ice on the affected area for the first 24 hours.  Put ice in a Ziploc bag and cover with a towel and place against area 15 minutes on 15 minutes off.  You may switch to heat after 24 hours.   Medications discontinued today:  Medications Discontinued During This Encounter  Medication Reason  . traMADol (ULTRAM) 50 MG tablet  Reorder  . celecoxib (CELEBREX) 200 MG capsule Reorder  . fentaNYL (DURAGESIC - DOSED MCG/HR) 50 MCG/HR Reorder  . pregabalin (LYRICA) 150 MG capsule   . pregabalin (LYRICA) 150 MG capsule Reorder   Medications administered today:  Ms. Mihalko had no medications administered during this visit.  Primary Care Physician: Mila Merry, MD Location: [facility] Facility Note by: Sydnee Levans. Laban Emperor, M.D, DABA, DABAPM, DABPM, DABIPP, FIPP

## 2015-09-05 DIAGNOSIS — J449 Chronic obstructive pulmonary disease, unspecified: Secondary | ICD-10-CM | POA: Diagnosis not present

## 2015-09-05 DIAGNOSIS — K219 Gastro-esophageal reflux disease without esophagitis: Secondary | ICD-10-CM | POA: Diagnosis not present

## 2015-09-05 DIAGNOSIS — I2510 Atherosclerotic heart disease of native coronary artery without angina pectoris: Secondary | ICD-10-CM | POA: Diagnosis not present

## 2015-09-05 DIAGNOSIS — I10 Essential (primary) hypertension: Secondary | ICD-10-CM | POA: Diagnosis not present

## 2015-09-05 DIAGNOSIS — G9340 Encephalopathy, unspecified: Secondary | ICD-10-CM | POA: Diagnosis not present

## 2015-09-05 DIAGNOSIS — I739 Peripheral vascular disease, unspecified: Secondary | ICD-10-CM | POA: Diagnosis not present

## 2015-09-05 DIAGNOSIS — G609 Hereditary and idiopathic neuropathy, unspecified: Secondary | ICD-10-CM | POA: Diagnosis not present

## 2015-09-05 DIAGNOSIS — M1990 Unspecified osteoarthritis, unspecified site: Secondary | ICD-10-CM | POA: Diagnosis not present

## 2015-09-05 DIAGNOSIS — F0390 Unspecified dementia without behavioral disturbance: Secondary | ICD-10-CM | POA: Diagnosis not present

## 2015-09-06 ENCOUNTER — Ambulatory Visit: Payer: Self-pay | Admitting: Family Medicine

## 2015-09-06 ENCOUNTER — Encounter: Payer: Self-pay | Admitting: Family Medicine

## 2015-09-06 ENCOUNTER — Ambulatory Visit (INDEPENDENT_AMBULATORY_CARE_PROVIDER_SITE_OTHER): Payer: Commercial Managed Care - HMO | Admitting: Family Medicine

## 2015-09-06 VITALS — BP 110/60 | HR 76 | Temp 98.1°F | Resp 16 | Wt 104.0 lb

## 2015-09-06 DIAGNOSIS — Z23 Encounter for immunization: Secondary | ICD-10-CM

## 2015-09-06 DIAGNOSIS — L03115 Cellulitis of right lower limb: Secondary | ICD-10-CM

## 2015-09-06 DIAGNOSIS — M79604 Pain in right leg: Secondary | ICD-10-CM

## 2015-09-06 DIAGNOSIS — S81811A Laceration without foreign body, right lower leg, initial encounter: Secondary | ICD-10-CM | POA: Diagnosis not present

## 2015-09-06 MED ORDER — LEVOFLOXACIN 500 MG PO TABS: 500.0000 mg | ORAL_TABLET | Freq: Every day | ORAL | Status: AC

## 2015-09-06 NOTE — Progress Notes (Signed)
Patient: Cheryl [name] Female    DOB: 01/24/1951   64 y.o.   MRN: [id number] Visit Date: 09/06/2015  Today's Provider: Mila Merry, MD   Chief Complaint  Patient presents with  . Leg Pain    follow up   Subjective:    HPI  Right Leg Pain follow up: Patient was last seen 08/17/2015 for right leg pain. Ultrasound was ordered and was normal. At that time leg was very tender and somewhat erythematous. Patient was treated with Levaquin and advised to follow up in 10-14 days. She states she continues to have leg pain.She was seen  by her Dr. Shireen Quan 2 days ago and he thinks the pain in her legs may be related to her back. By her report he prescribed a new prn pain medication and is considering epidural injections.   She cut the back of lower leg on walker last week while she was still taking levoquin which she finished 3 days. There has been small amount clear and yellow drainage.  Allergies  Allergen Reactions  . Percocet [Oxycodone-Acetaminophen] Hives  . Aspirin Nausea Only and Other (See Comments)    Reaction:  GI upset   . Codeine Nausea Only and Other (See Comments)    Reaction:  GI upset   . Morphine Other (See Comments)    Reaction:  Unknown   . Pregabalin Other (See Comments)    Reaction:  Makes pt sleep excessively.    . Propoxyphene Other (See Comments)    Reaction:  GI upset   . Sulfa Antibiotics Rash and Other (See Comments)    Reaction:  GI upset    Previous Medications   ACETAMINOPHEN (TYLENOL) 500 MG TABLET    Take 1,000 mg by mouth every 6 (six) hours as needed for mild pain or headache.    ADVAIR DISKUS 250-50 MCG/DOSE AEPB    inhale 1 dose by mouth twice a day   ALBUTEROL (PROVENTIL HFA;VENTOLIN HFA) 108 (90 BASE) MCG/ACT INHALER    Inhale 2 puffs into the lungs every 4 (four) hours as needed for wheezing or shortness of breath.   ALPRAZOLAM (XANAX) 0.5 MG TABLET    Take 0.5 mg by mouth daily as needed for anxiety.    CLOPIDOGREL (PLAVIX) 75 MG  TABLET    Take 75 mg by mouth daily.   CYCLOBENZAPRINE (FLEXERIL) 5 MG TABLET    Take 5-10 mg by mouth every 8 (eight) hours as needed for muscle spasms.   DIPHENOXYLATE-ATROPINE (LOMOTIL) 2.5-0.025 MG PER TABLET    Take 1 tablet by mouth 2 (two) times daily.   DONEPEZIL (ARICEPT) 10 MG TABLET    Take 10 mg by mouth at bedtime.   ESTROPIPATE (OGEN) 0.75 MG TABLET    Take 0.75 mg by mouth daily.   FENTANYL (DURAGESIC - DOSED MCG/HR) 50 MCG/HR    Place 1 patch (50 mcg total) onto the skin every 3 (three) days.   FENTANYL (DURAGESIC) 50 MCG/HR    Place 1 patch (50 mcg total) onto the skin every 3 (three) days.   FENTANYL (DURAGESIC) 50 MCG/HR    Place 1 patch (50 mcg total) onto the skin every 3 (three) days.   FUROSEMIDE (LASIX) 40 MG TABLET    Take 40 mg by mouth daily.   GABAPENTIN (NEURONTIN) 800 MG TABLET    Take by mouth.   LANSOPRAZOLE (PREVACID) 30 MG CAPSULE    Take 30 mg by mouth 2 (two) times daily.  MAGNESIUM OXIDE (MAG-OX) 400 MG TABLET    Take by mouth.   MELOXICAM (MOBIC) 15 MG TABLET    Take 1 tablet (15 mg total) by mouth daily.   MUPIROCIN CREAM (BACTROBAN) 2 %    Apply 1 application topically 3 (three) times daily.   PANTOPRAZOLE (PROTONIX) 40 MG TABLET       PREGABALIN (LYRICA) 150 MG CAPSULE    Take 1 capsule (150 mg total) by mouth 2 (two) times daily.   SIMVASTATIN (ZOCOR) 10 MG TABLET    Take 10 mg by mouth at bedtime.    SUMATRIPTAN (IMITREX) 25 MG TABLET    Take 25 mg by mouth as needed for migraine. May repeat in 2 hours if headache persists or recurs.   TIOTROPIUM (SPIRIVA) 18 MCG INHALATION CAPSULE    Place 18 mcg into inhaler and inhale daily.   TRAMADOL (ULTRAM) 50 MG TABLET    Take 2 tablets (100 mg total) by mouth every 6 (six) hours as needed.    Review of Systems  Constitutional: Negative for fever, chills, appetite change and fatigue.  Respiratory: Positive for shortness of breath. Negative for chest tightness.   Cardiovascular: Positive for leg swelling.  Negative for chest pain and palpitations.  Gastrointestinal: Negative for nausea, vomiting and abdominal pain.  Skin: Positive for color change (redness in right lower leg).  Neurological: Negative for dizziness and weakness.    Social History  Substance Use Topics  . Smoking status: Current Every Day Smoker -- 1.00 packs/day for 50 years    Types: Cigarettes  . Smokeless tobacco: Not on file  . Alcohol Use: No   Objective:   BP 110/60 mmHg  Pulse 76  Temp(Src) 98.1 F (36.7 C) (Oral)  Resp 16  Wt 104 lb (47.174 kg)  SpO2 97%  Physical Exam  MS: No erythema of right anterior leg, has resolved since last visit 9-29. No edema. She does have 1.5cm scabbed laceration right posterior leg with 1cm of surrounding erythem and scant light yellow discharge.     Assessment & Plan:     1. Cellulitis of right lower extremity Has resolved on anterior leg, but now signs of infection of posterior leg secondary to laceration below. Start back on Levaquin x 7 days while awaiting culture results.  - Aerobic culture  2. Laceration of leg excluding thigh, right, initial encounter  - Td : Tetanus/diphtheria >7yo Preservative  free - Aerobic culture  3. Right leg pain Anterior leg greatly improved after treatment for cellulitis, but still has some radicular pain managed by Dr. Shireen QuanNaviera.        Mila Merryonald Russell Engelstad, MD  [facility]

## 2015-09-07 ENCOUNTER — Ambulatory Visit: Payer: Commercial Managed Care - HMO | Admitting: Family Medicine

## 2015-09-07 ENCOUNTER — Other Ambulatory Visit: Payer: Self-pay

## 2015-09-07 DIAGNOSIS — G609 Hereditary and idiopathic neuropathy, unspecified: Secondary | ICD-10-CM | POA: Diagnosis not present

## 2015-09-07 DIAGNOSIS — I10 Essential (primary) hypertension: Secondary | ICD-10-CM | POA: Diagnosis not present

## 2015-09-07 DIAGNOSIS — M1990 Unspecified osteoarthritis, unspecified site: Secondary | ICD-10-CM | POA: Diagnosis not present

## 2015-09-07 DIAGNOSIS — I2510 Atherosclerotic heart disease of native coronary artery without angina pectoris: Secondary | ICD-10-CM | POA: Diagnosis not present

## 2015-09-07 DIAGNOSIS — G9340 Encephalopathy, unspecified: Secondary | ICD-10-CM | POA: Diagnosis not present

## 2015-09-07 DIAGNOSIS — I739 Peripheral vascular disease, unspecified: Secondary | ICD-10-CM | POA: Diagnosis not present

## 2015-09-07 DIAGNOSIS — J449 Chronic obstructive pulmonary disease, unspecified: Secondary | ICD-10-CM | POA: Diagnosis not present

## 2015-09-07 DIAGNOSIS — F0390 Unspecified dementia without behavioral disturbance: Secondary | ICD-10-CM | POA: Diagnosis not present

## 2015-09-07 DIAGNOSIS — K219 Gastro-esophageal reflux disease without esophagitis: Secondary | ICD-10-CM | POA: Diagnosis not present

## 2015-09-07 NOTE — Patient Outreach (Signed)
[facility] Care Management  09/07/2015  Cheryl MargaritaMary George [name] [birth year] [id number]   Telephone call to patient for re-introduction to health coach.  No answer.  HIPAA compliant voice message left.    Plan: RN Health Coach will contact patient within 1 week.    Bary Lericheionne J Ivalee Strauser, RN, MSN [facility] Care Management RN Telephonic Health Coach [phone]

## 2015-09-08 LAB — AEROBIC CULTURE

## 2015-09-12 ENCOUNTER — Other Ambulatory Visit: Payer: Self-pay | Admitting: Family Medicine

## 2015-09-13 ENCOUNTER — Other Ambulatory Visit: Payer: Self-pay

## 2015-09-13 NOTE — Patient Outreach (Signed)
[facility]) Care Management  [facility] Care Manager  09/13/2015   Cheryl [name] 06/08/1951 [id number]  Subjective:  Initial telephone call to patient after transition of care calls from community nurse.  Patient reports she is ok but having some pain in her legs and that her doctor thinks her pain is from her back and that he recommended epidural to her back but she is not sure she wants to do that.  She shares she does have some pain medication to take but does not take it as she is alone most of the time and is scared to take while alone.  Patient reports however that her pain is worse when she is up and about.  Patient also reports she has a wound to her right leg and that she is dressing it with a Band-Aid.  Patient reports clear drainage noted.  Discussed with patient signs and symptoms of wound infection and when to notify physician.  Patient unable to review medications as she does not have a list and she reports that her husband does her medications.    COPD: Patient reports right now her breathing is good and she does not have to use her rescue inhaler as long as she uses her control inhalers. Discussed COPD action plan again with patient.   Objective:   Current Medications:  Current Outpatient Prescriptions  Medication Sig Dispense Refill  . acetaminophen (TYLENOL) 500 MG tablet Take 1,000 mg by mouth every 6 (six) hours as needed for mild pain or headache.     . ADVAIR DISKUS 250-50 MCG/DOSE AEPB inhale 1 dose by mouth twice a day 60 each 11  . albuterol (PROVENTIL HFA;VENTOLIN HFA) 108 (90 BASE) MCG/ACT inhaler Inhale 2 puffs into the lungs every 4 (four) hours as needed for wheezing or shortness of breath. 18 g 3  . ALPRAZolam (XANAX) 0.5 MG tablet Take 0.5 mg by mouth daily as needed for anxiety.     . clopidogrel (PLAVIX) 75 MG tablet Take 75 mg by mouth daily.    . cyclobenzaprine (FLEXERIL) 5 MG tablet Take 5-10 mg by mouth every 8 (eight) hours as needed for  muscle spasms.    . diphenoxylate-atropine (LOMOTIL) 2.5-0.025 MG per tablet Take 1 tablet by mouth 2 (two) times daily.    Marland Kitchen donepezil (ARICEPT) 10 MG tablet Take 10 mg by mouth at bedtime.    Marland Kitchen estropipate (OGEN) 0.75 MG tablet Take 0.75 mg by mouth daily.    . fentaNYL (DURAGESIC - DOSED MCG/HR) 50 MCG/HR Place 1 patch (50 mcg total) onto the skin every 3 (three) days. 10 patch 0  . fentaNYL (DURAGESIC) 50 MCG/HR Place 1 patch (50 mcg total) onto the skin every 3 (three) days. 10 patch 0  . fentaNYL (DURAGESIC) 50 MCG/HR Place 1 patch (50 mcg total) onto the skin every 3 (three) days. 10 patch 0  . furosemide (LASIX) 40 MG tablet Take 40 mg by mouth daily.    Marland Kitchen gabapentin (NEURONTIN) 800 MG tablet Take by mouth.    . lansoprazole (PREVACID) 30 MG capsule Take 30 mg by mouth 2 (two) times daily.    Marland Kitchen levofloxacin (LEVAQUIN) 500 MG tablet Take 1 tablet (500 mg total) by mouth daily. 7 tablet 0  . magnesium oxide (MAG-OX) 400 MG tablet Take by mouth.    . meloxicam (MOBIC) 15 MG tablet Take 1 tablet (15 mg total) by mouth daily. 30 tablet 2  . mupirocin cream (BACTROBAN) 2 % Apply 1 application  topically 3 (three) times daily.    . pantoprazole (PROTONIX) 40 MG tablet   1  . pregabalin (LYRICA) 150 MG capsule Take 1 capsule (150 mg total) by mouth 2 (two) times daily. 60 capsule 2  . simvastatin (ZOCOR) 10 MG tablet Take 10 mg by mouth at bedtime.     . SUMAtriptan (IMITREX) 25 MG tablet Take 25 mg by mouth as needed for migraine. May repeat in 2 hours if headache persists or recurs.    Marland Kitchen. tiotropium (SPIRIVA) 18 MCG inhalation capsule Place 18 mcg into inhaler and inhale daily.    . traMADol (ULTRAM) 50 MG tablet Take 2 tablets (100 mg total) by mouth every 6 (six) hours as needed. 240 tablet 2   No current facility-administered medications for this visit.    Functional Status:  In your present state of health, do you have any difficulty performing the following activities: 09/13/2015  09/01/2015  Hearing? N N  Vision? Y Y  Difficulty concentrating or making decisions? Malvin JohnsY Y  Walking or climbing stairs? Y Y  Dressing or bathing? Y Y  Doing errands, shopping? Malvin JohnsY Y  Preparing Food and eating ? Y Y  Using the Toilet? N N  In the past six months, have you accidently leaked urine? Y Y  Do you have problems with loss of bowel control? Y Y  Managing your Medications? Y Y  Managing your Finances? Malvin JohnsY Y  Housekeeping or managing your Housekeeping? Malvin JohnsY Y    Fall/Depression Screening: PHQ 2/9 Scores 09/13/2015 09/01/2015 07/13/2015 06/06/2015 05/01/2015 03/30/2015  PHQ - 2 Score 0 0 0 0 0 0    Assessment: Patient continues to benefit from health coach outreach for disease management.   Plan:  RN Health Coach will provide ongoing education for patient on COPD through phone calls and sending printed information to patient for further discussion.   RN Health Coach will send initial barriers letter, assessment, and care plan to primary care physician.  RN Health Coach will contact patient within one month and patient agrees to next contact.  Bary Lericheionne J Tikesha Mort, RN, MSN [facility] Care Management RN Telephonic Health Coach [phone]

## 2015-09-14 DIAGNOSIS — D500 Iron deficiency anemia secondary to blood loss (chronic): Secondary | ICD-10-CM | POA: Diagnosis not present

## 2015-09-14 DIAGNOSIS — K219 Gastro-esophageal reflux disease without esophagitis: Secondary | ICD-10-CM | POA: Diagnosis not present

## 2015-09-14 DIAGNOSIS — R159 Full incontinence of feces: Secondary | ICD-10-CM | POA: Diagnosis not present

## 2015-09-14 NOTE — Telephone Encounter (Signed)
Please call in alprazolam.  

## 2015-09-14 NOTE — Telephone Encounter (Signed)
Rx called in to pharmacy. 

## 2015-09-16 DIAGNOSIS — G609 Hereditary and idiopathic neuropathy, unspecified: Secondary | ICD-10-CM | POA: Diagnosis not present

## 2015-09-16 DIAGNOSIS — N390 Urinary tract infection, site not specified: Secondary | ICD-10-CM | POA: Diagnosis not present

## 2015-09-16 DIAGNOSIS — R110 Nausea: Secondary | ICD-10-CM | POA: Diagnosis not present

## 2015-09-16 DIAGNOSIS — J449 Chronic obstructive pulmonary disease, unspecified: Secondary | ICD-10-CM | POA: Diagnosis not present

## 2015-09-18 DIAGNOSIS — G9340 Encephalopathy, unspecified: Secondary | ICD-10-CM | POA: Diagnosis not present

## 2015-09-18 DIAGNOSIS — I10 Essential (primary) hypertension: Secondary | ICD-10-CM | POA: Diagnosis not present

## 2015-09-18 DIAGNOSIS — I739 Peripheral vascular disease, unspecified: Secondary | ICD-10-CM | POA: Diagnosis not present

## 2015-09-18 DIAGNOSIS — J449 Chronic obstructive pulmonary disease, unspecified: Secondary | ICD-10-CM | POA: Diagnosis not present

## 2015-09-18 DIAGNOSIS — K219 Gastro-esophageal reflux disease without esophagitis: Secondary | ICD-10-CM | POA: Diagnosis not present

## 2015-09-18 DIAGNOSIS — G609 Hereditary and idiopathic neuropathy, unspecified: Secondary | ICD-10-CM | POA: Diagnosis not present

## 2015-09-18 DIAGNOSIS — I2510 Atherosclerotic heart disease of native coronary artery without angina pectoris: Secondary | ICD-10-CM | POA: Diagnosis not present

## 2015-09-18 DIAGNOSIS — F0390 Unspecified dementia without behavioral disturbance: Secondary | ICD-10-CM | POA: Diagnosis not present

## 2015-09-18 DIAGNOSIS — M1990 Unspecified osteoarthritis, unspecified site: Secondary | ICD-10-CM | POA: Diagnosis not present

## 2015-09-19 ENCOUNTER — Telehealth: Payer: Self-pay | Admitting: Family Medicine

## 2015-09-19 DIAGNOSIS — A499 Bacterial infection, unspecified: Secondary | ICD-10-CM

## 2015-09-19 MED ORDER — AMOXICILLIN-POT CLAVULANATE 875-125 MG PO TABS: 1.0000 | ORAL_TABLET | Freq: Two times a day (BID) | ORAL | Status: DC

## 2015-09-19 NOTE — Telephone Encounter (Signed)
Advised pt as directed. Pt verbalized fully understanding.  Thanks,   

## 2015-09-19 NOTE — Telephone Encounter (Signed)
Please advise patient that the material that came from her nose appears to be a bacterial infection. Need to start Augmentin 875 twice a day for 14 days. If it is note completely cleared up when finished with antibiotic she will need referral to ENT.

## 2015-09-23 DIAGNOSIS — J449 Chronic obstructive pulmonary disease, unspecified: Secondary | ICD-10-CM | POA: Diagnosis not present

## 2015-09-23 DIAGNOSIS — G629 Polyneuropathy, unspecified: Secondary | ICD-10-CM | POA: Diagnosis not present

## 2015-09-23 DIAGNOSIS — I2510 Atherosclerotic heart disease of native coronary artery without angina pectoris: Secondary | ICD-10-CM | POA: Diagnosis not present

## 2015-09-23 DIAGNOSIS — R29898 Other symptoms and signs involving the musculoskeletal system: Secondary | ICD-10-CM | POA: Diagnosis not present

## 2015-09-28 ENCOUNTER — Other Ambulatory Visit: Payer: Self-pay | Admitting: Pain Medicine

## 2015-10-02 ENCOUNTER — Other Ambulatory Visit: Payer: Self-pay | Admitting: Family Medicine

## 2015-10-02 DIAGNOSIS — G9340 Encephalopathy, unspecified: Secondary | ICD-10-CM | POA: Diagnosis not present

## 2015-10-02 DIAGNOSIS — F0390 Unspecified dementia without behavioral disturbance: Secondary | ICD-10-CM | POA: Diagnosis not present

## 2015-10-02 DIAGNOSIS — I10 Essential (primary) hypertension: Secondary | ICD-10-CM | POA: Diagnosis not present

## 2015-10-02 DIAGNOSIS — K219 Gastro-esophageal reflux disease without esophagitis: Secondary | ICD-10-CM | POA: Diagnosis not present

## 2015-10-02 DIAGNOSIS — I739 Peripheral vascular disease, unspecified: Secondary | ICD-10-CM | POA: Diagnosis not present

## 2015-10-02 DIAGNOSIS — G609 Hereditary and idiopathic neuropathy, unspecified: Secondary | ICD-10-CM | POA: Diagnosis not present

## 2015-10-02 DIAGNOSIS — Z1231 Encounter for screening mammogram for malignant neoplasm of breast: Secondary | ICD-10-CM

## 2015-10-02 DIAGNOSIS — J449 Chronic obstructive pulmonary disease, unspecified: Secondary | ICD-10-CM | POA: Diagnosis not present

## 2015-10-02 DIAGNOSIS — I2510 Atherosclerotic heart disease of native coronary artery without angina pectoris: Secondary | ICD-10-CM | POA: Diagnosis not present

## 2015-10-02 DIAGNOSIS — M1990 Unspecified osteoarthritis, unspecified site: Secondary | ICD-10-CM | POA: Diagnosis not present

## 2015-10-06 ENCOUNTER — Ambulatory Visit
Admission: RE | Admit: 2015-10-06 | Discharge: 2015-10-06 | Disposition: A | Discharge status: Home / Self Care | Payer: Commercial Managed Care - HMO | Source: Ambulatory Visit | Attending: Family Medicine | Admitting: Family Medicine

## 2015-10-06 ENCOUNTER — Other Ambulatory Visit: Payer: Self-pay | Admitting: Family Medicine

## 2015-10-06 DIAGNOSIS — Z1231 Encounter for screening mammogram for malignant neoplasm of breast: Secondary | ICD-10-CM | POA: Diagnosis not present

## 2015-10-09 ENCOUNTER — Emergency Department: Payer: Commercial Managed Care - HMO

## 2015-10-09 ENCOUNTER — Encounter: Payer: Self-pay | Admitting: Emergency Medicine

## 2015-10-09 ENCOUNTER — Observation Stay
Admission: EM | Admit: 2015-10-09 | Discharge: 2015-10-12 | Disposition: A | Discharge status: Home / Self Care | Payer: Commercial Managed Care - HMO | Attending: Internal Medicine | Admitting: Internal Medicine

## 2015-10-09 DIAGNOSIS — E559 Vitamin D deficiency, unspecified: Secondary | ICD-10-CM | POA: Diagnosis not present

## 2015-10-09 DIAGNOSIS — E7800 Pure hypercholesterolemia, unspecified: Secondary | ICD-10-CM | POA: Insufficient documentation

## 2015-10-09 DIAGNOSIS — R079 Chest pain, unspecified: Secondary | ICD-10-CM | POA: Diagnosis not present

## 2015-10-09 DIAGNOSIS — F17210 Nicotine dependence, cigarettes, uncomplicated: Secondary | ICD-10-CM | POA: Insufficient documentation

## 2015-10-09 DIAGNOSIS — R55 Syncope and collapse: Secondary | ICD-10-CM | POA: Diagnosis not present

## 2015-10-09 DIAGNOSIS — Z8261 Family history of arthritis: Secondary | ICD-10-CM | POA: Diagnosis not present

## 2015-10-09 DIAGNOSIS — G894 Chronic pain syndrome: Secondary | ICD-10-CM | POA: Insufficient documentation

## 2015-10-09 DIAGNOSIS — R109 Unspecified abdominal pain: Secondary | ICD-10-CM

## 2015-10-09 DIAGNOSIS — Z808 Family history of malignant neoplasm of other organs or systems: Secondary | ICD-10-CM | POA: Insufficient documentation

## 2015-10-09 DIAGNOSIS — K219 Gastro-esophageal reflux disease without esophagitis: Secondary | ICD-10-CM | POA: Diagnosis not present

## 2015-10-09 DIAGNOSIS — Z885 Allergy status to narcotic agent status: Secondary | ICD-10-CM | POA: Insufficient documentation

## 2015-10-09 DIAGNOSIS — M549 Dorsalgia, unspecified: Secondary | ICD-10-CM | POA: Diagnosis not present

## 2015-10-09 DIAGNOSIS — J449 Chronic obstructive pulmonary disease, unspecified: Secondary | ICD-10-CM | POA: Insufficient documentation

## 2015-10-09 DIAGNOSIS — M810 Age-related osteoporosis without current pathological fracture: Secondary | ICD-10-CM | POA: Diagnosis not present

## 2015-10-09 DIAGNOSIS — R52 Pain, unspecified: Secondary | ICD-10-CM | POA: Insufficient documentation

## 2015-10-09 DIAGNOSIS — K5900 Constipation, unspecified: Secondary | ICD-10-CM | POA: Insufficient documentation

## 2015-10-09 DIAGNOSIS — M1990 Unspecified osteoarthritis, unspecified site: Secondary | ICD-10-CM | POA: Diagnosis not present

## 2015-10-09 DIAGNOSIS — R1084 Generalized abdominal pain: Secondary | ICD-10-CM | POA: Insufficient documentation

## 2015-10-09 DIAGNOSIS — R410 Disorientation, unspecified: Secondary | ICD-10-CM | POA: Insufficient documentation

## 2015-10-09 DIAGNOSIS — R0902 Hypoxemia: Secondary | ICD-10-CM | POA: Diagnosis not present

## 2015-10-09 DIAGNOSIS — R51 Headache: Secondary | ICD-10-CM | POA: Diagnosis not present

## 2015-10-09 DIAGNOSIS — J209 Acute bronchitis, unspecified: Secondary | ICD-10-CM

## 2015-10-09 DIAGNOSIS — R4182 Altered mental status, unspecified: Principal | ICD-10-CM | POA: Insufficient documentation

## 2015-10-09 DIAGNOSIS — Z8249 Family history of ischemic heart disease and other diseases of the circulatory system: Secondary | ICD-10-CM | POA: Diagnosis not present

## 2015-10-09 DIAGNOSIS — F419 Anxiety disorder, unspecified: Secondary | ICD-10-CM | POA: Insufficient documentation

## 2015-10-09 DIAGNOSIS — J9811 Atelectasis: Secondary | ICD-10-CM | POA: Insufficient documentation

## 2015-10-09 DIAGNOSIS — R05 Cough: Secondary | ICD-10-CM | POA: Diagnosis not present

## 2015-10-09 DIAGNOSIS — I739 Peripheral vascular disease, unspecified: Secondary | ICD-10-CM | POA: Diagnosis not present

## 2015-10-09 DIAGNOSIS — Z888 Allergy status to other drugs, medicaments and biological substances status: Secondary | ICD-10-CM | POA: Insufficient documentation

## 2015-10-09 DIAGNOSIS — R928 Other abnormal and inconclusive findings on diagnostic imaging of breast: Secondary | ICD-10-CM | POA: Insufficient documentation

## 2015-10-09 DIAGNOSIS — Z833 Family history of diabetes mellitus: Secondary | ICD-10-CM | POA: Insufficient documentation

## 2015-10-09 DIAGNOSIS — Z882 Allergy status to sulfonamides status: Secondary | ICD-10-CM | POA: Insufficient documentation

## 2015-10-09 DIAGNOSIS — M546 Pain in thoracic spine: Secondary | ICD-10-CM

## 2015-10-09 DIAGNOSIS — N6310 Unspecified lump in the right breast, unspecified quadrant: Secondary | ICD-10-CM

## 2015-10-09 DIAGNOSIS — I700 Atherosclerosis of aorta: Secondary | ICD-10-CM | POA: Insufficient documentation

## 2015-10-09 DIAGNOSIS — I517 Cardiomegaly: Secondary | ICD-10-CM | POA: Diagnosis not present

## 2015-10-09 DIAGNOSIS — I2510 Atherosclerotic heart disease of native coronary artery without angina pectoris: Secondary | ICD-10-CM | POA: Diagnosis not present

## 2015-10-09 DIAGNOSIS — N63 Unspecified lump in breast: Secondary | ICD-10-CM | POA: Insufficient documentation

## 2015-10-09 DIAGNOSIS — F0390 Unspecified dementia without behavioral disturbance: Secondary | ICD-10-CM | POA: Insufficient documentation

## 2015-10-09 DIAGNOSIS — N951 Menopausal and female climacteric states: Secondary | ICD-10-CM | POA: Insufficient documentation

## 2015-10-09 DIAGNOSIS — Z801 Family history of malignant neoplasm of trachea, bronchus and lung: Secondary | ICD-10-CM | POA: Insufficient documentation

## 2015-10-09 DIAGNOSIS — G629 Polyneuropathy, unspecified: Secondary | ICD-10-CM | POA: Insufficient documentation

## 2015-10-09 DIAGNOSIS — G4700 Insomnia, unspecified: Secondary | ICD-10-CM | POA: Insufficient documentation

## 2015-10-09 DIAGNOSIS — I10 Essential (primary) hypertension: Secondary | ICD-10-CM | POA: Insufficient documentation

## 2015-10-09 DIAGNOSIS — R634 Abnormal weight loss: Secondary | ICD-10-CM | POA: Insufficient documentation

## 2015-10-09 DIAGNOSIS — G8929 Other chronic pain: Secondary | ICD-10-CM | POA: Diagnosis present

## 2015-10-09 DIAGNOSIS — Z9049 Acquired absence of other specified parts of digestive tract: Secondary | ICD-10-CM | POA: Insufficient documentation

## 2015-10-09 DIAGNOSIS — Z79899 Other long term (current) drug therapy: Secondary | ICD-10-CM | POA: Diagnosis not present

## 2015-10-09 DIAGNOSIS — K449 Diaphragmatic hernia without obstruction or gangrene: Secondary | ICD-10-CM | POA: Insufficient documentation

## 2015-10-09 DIAGNOSIS — Z7951 Long term (current) use of inhaled steroids: Secondary | ICD-10-CM | POA: Insufficient documentation

## 2015-10-09 DIAGNOSIS — E785 Hyperlipidemia, unspecified: Secondary | ICD-10-CM | POA: Insufficient documentation

## 2015-10-09 DIAGNOSIS — Z90710 Acquired absence of both cervix and uterus: Secondary | ICD-10-CM | POA: Insufficient documentation

## 2015-10-09 DIAGNOSIS — G43909 Migraine, unspecified, not intractable, without status migrainosus: Secondary | ICD-10-CM | POA: Insufficient documentation

## 2015-10-09 DIAGNOSIS — Z886 Allergy status to analgesic agent status: Secondary | ICD-10-CM | POA: Diagnosis not present

## 2015-10-09 DIAGNOSIS — D649 Anemia, unspecified: Secondary | ICD-10-CM | POA: Insufficient documentation

## 2015-10-09 DIAGNOSIS — R61 Generalized hyperhidrosis: Secondary | ICD-10-CM | POA: Insufficient documentation

## 2015-10-09 DIAGNOSIS — J441 Chronic obstructive pulmonary disease with (acute) exacerbation: Secondary | ICD-10-CM

## 2015-10-09 DIAGNOSIS — K838 Other specified diseases of biliary tract: Secondary | ICD-10-CM | POA: Insufficient documentation

## 2015-10-09 DIAGNOSIS — R400 Somnolence: Secondary | ICD-10-CM | POA: Diagnosis not present

## 2015-10-09 DIAGNOSIS — F1120 Opioid dependence, uncomplicated: Secondary | ICD-10-CM | POA: Diagnosis not present

## 2015-10-09 DIAGNOSIS — M5136 Other intervertebral disc degeneration, lumbar region: Secondary | ICD-10-CM | POA: Diagnosis not present

## 2015-10-09 DIAGNOSIS — F410 Panic disorder [episodic paroxysmal anxiety] without agoraphobia: Secondary | ICD-10-CM | POA: Diagnosis not present

## 2015-10-09 HISTORY — DX: Unspecified asthma, uncomplicated: J45.909

## 2015-10-09 LAB — COMPREHENSIVE METABOLIC PANEL
ALBUMIN: 3.6 g/dL (ref 3.5–5.0)
ALK PHOS: 86 U/L (ref 38–126)
ALT: 9 U/L — AB (ref 14–54)
ANION GAP: 8 (ref 5–15)
AST: 17 U/L (ref 15–41)
BILIRUBIN TOTAL: 0.3 mg/dL (ref 0.3–1.2)
BUN: 12 mg/dL (ref 6–20)
CALCIUM: 8.8 mg/dL — AB (ref 8.9–10.3)
CO2: 26 mmol/L (ref 22–32)
Chloride: 104 mmol/L (ref 101–111)
Creatinine, Ser: 0.75 mg/dL (ref 0.44–1.00)
GFR calc Af Amer: >60 mL/min (ref >60)
GFR calc non Af Amer: >60 mL/min (ref >60)
GLUCOSE: 106 mg/dL — AB (ref 65–99)
Potassium: 3.7 mmol/L (ref 3.5–5.1)
SODIUM: 138 mmol/L (ref 135–145)
TOTAL PROTEIN: 7.3 g/dL (ref 6.5–8.1)

## 2015-10-09 LAB — URINALYSIS COMPLETE WITH MICROSCOPIC (ARMC ONLY)
BACTERIA UA: NONE SEEN
Bilirubin Urine: NEGATIVE
Glucose, UA: NEGATIVE mg/dL
HGB URINE DIPSTICK: NEGATIVE
LEUKOCYTES UA: NEGATIVE
NITRITE: NEGATIVE
PH: 7 (ref 5.0–8.0)
PROTEIN: NEGATIVE mg/dL
SPECIFIC GRAVITY, URINE: 1.015 (ref 1.005–1.030)

## 2015-10-09 LAB — CBC WITH DIFFERENTIAL/PLATELET
BASOS ABS: 0 10*3/uL (ref 0–0.1)
BASOS PCT: 0 %
EOS ABS: 0 10*3/uL (ref 0–0.7)
Eosinophils Relative: 0 %
HEMATOCRIT: 34.1 % — AB (ref 35.0–47.0)
HEMOGLOBIN: 11.1 g/dL — AB (ref 12.0–16.0)
Lymphocytes Relative: 6 %
Lymphs Abs: 0.6 10*3/uL — ABNORMAL LOW (ref 1.0–3.6)
MCH: 30.3 pg (ref 26.0–34.0)
MCHC: 32.5 g/dL (ref 32.0–36.0)
MCV: 93.2 fL (ref 80.0–100.0)
Monocytes Absolute: 0.8 10*3/uL (ref 0.2–0.9)
Monocytes Relative: 9 %
NEUTROS ABS: 8.1 10*3/uL — AB (ref 1.4–6.5)
NEUTROS PCT: 85 %
Platelets: 235 10*3/uL (ref 150–440)
RBC: 3.65 MIL/uL — AB (ref 3.80–5.20)
RDW: 14.1 % (ref 11.5–14.5)
WBC: 9.6 10*3/uL (ref 3.6–11.0)

## 2015-10-09 LAB — LACTIC ACID, PLASMA
LACTIC ACID, VENOUS: 0.9 mmol/L (ref 0.5–2.0)
LACTIC ACID, VENOUS: 0.7 mmol/L (ref 0.5–2.0)

## 2015-10-09 LAB — TROPONIN I: Troponin I: <0.03 ng/mL (ref <0.031)

## 2015-10-09 LAB — LIPASE, BLOOD: Lipase: 20 U/L (ref 11–51)

## 2015-10-09 MED ORDER — FERROUS SULFATE 325 (65 FE) MG PO TABS
325.0000 mg | ORAL_TABLET | Freq: Every day | ORAL | Status: DC
Start: 2015-10-10 — End: 2015-10-12
  Administered 2015-10-12: 325 mg via ORAL
  Filled 2015-10-09 (×2): qty 1

## 2015-10-09 MED ORDER — ESTROPIPATE 0.75 MG PO TABS: 0.7500 mg | ORAL_TABLET | Freq: Every day | ORAL | Status: DC

## 2015-10-09 MED ORDER — ALPRAZOLAM 0.25 MG PO TABS
0.2500 mg | ORAL_TABLET | Freq: Three times a day (TID) | ORAL | Status: DC | PRN
  Administered 2015-10-11: 0.25 mg via ORAL
  Filled 2015-10-09: qty 1

## 2015-10-09 MED ORDER — ACETAMINOPHEN 500 MG PO TABS
1000.0000 mg | ORAL_TABLET | Freq: Four times a day (QID) | ORAL | Status: DC | PRN
  Administered 2015-10-11: 1000 mg via ORAL
  Filled 2015-10-09: qty 2

## 2015-10-09 MED ORDER — ONDANSETRON HCL 4 MG PO TABS: 4.0000 mg | ORAL_TABLET | Freq: Four times a day (QID) | ORAL | Status: DC | PRN

## 2015-10-09 MED ORDER — ALBUTEROL SULFATE (2.5 MG/3ML) 0.083% IN NEBU: 2.5000 mg | INHALATION_SOLUTION | RESPIRATORY_TRACT | Status: DC | PRN

## 2015-10-09 MED ORDER — PANTOPRAZOLE SODIUM 40 MG PO TBEC
40.0000 mg | DELAYED_RELEASE_TABLET | Freq: Every day | ORAL | Status: DC
  Administered 2015-10-12: 40 mg via ORAL
  Filled 2015-10-09: qty 1

## 2015-10-09 MED ORDER — FENTANYL 50 MCG/HR TD PT72
50.0000 ug | MEDICATED_PATCH | TRANSDERMAL | Status: DC
  Administered 2015-10-09: 50 ug via TRANSDERMAL
  Filled 2015-10-09: qty 1

## 2015-10-09 MED ORDER — HEPARIN SODIUM (PORCINE) 5000 UNIT/ML IJ SOLN
5000.0000 [IU] | Freq: Three times a day (TID) | INTRAMUSCULAR | Status: DC
  Administered 2015-10-09: 5000 [IU] via SUBCUTANEOUS
  Filled 2015-10-09: qty 1

## 2015-10-09 MED ORDER — TIOTROPIUM BROMIDE MONOHYDRATE 18 MCG IN CAPS
18.0000 ug | ORAL_CAPSULE | Freq: Every day | RESPIRATORY_TRACT | Status: DC
  Administered 2015-10-12: 18 ug via RESPIRATORY_TRACT
  Filled 2015-10-09: qty 5

## 2015-10-09 MED ORDER — ALBUTEROL SULFATE HFA 108 (90 BASE) MCG/ACT IN AERS: 2.0000 | INHALATION_SPRAY | RESPIRATORY_TRACT | Status: DC | PRN

## 2015-10-09 MED ORDER — SODIUM CHLORIDE 0.9 % IV SOLN
INTRAVENOUS | Status: DC
  Administered 2015-10-09: 19:00:00 via INTRAVENOUS
  Administered 2015-10-12: 1 mL via INTRAVENOUS

## 2015-10-09 MED ORDER — MOMETASONE FURO-FORMOTEROL FUM 100-5 MCG/ACT IN AERO
2.0000 | INHALATION_SPRAY | Freq: Two times a day (BID) | RESPIRATORY_TRACT | Status: DC
  Administered 2015-10-12: 2 via RESPIRATORY_TRACT
  Filled 2015-10-09: qty 8.8

## 2015-10-09 MED ORDER — DONEPEZIL HCL 5 MG PO TABS
10.0000 mg | ORAL_TABLET | Freq: Every day | ORAL | Status: DC
  Filled 2015-10-09: qty 2

## 2015-10-09 MED ORDER — CLOPIDOGREL BISULFATE 75 MG PO TABS
75.0000 mg | ORAL_TABLET | Freq: Every day | ORAL | Status: DC
  Administered 2015-10-12: 75 mg via ORAL
  Filled 2015-10-09: qty 1

## 2015-10-09 MED ORDER — ONDANSETRON HCL 4 MG/2ML IJ SOLN: 4.0000 mg | Freq: Four times a day (QID) | INTRAMUSCULAR | Status: DC | PRN

## 2015-10-09 MED ORDER — IOHEXOL 240 MG/ML SOLN
25.0000 mL | INTRAMUSCULAR | Status: AC
  Administered 2015-10-09: 25 mL via ORAL

## 2015-10-09 MED ORDER — MUPIROCIN 2 % EX OINT
1.0000 "application " | TOPICAL_OINTMENT | Freq: Two times a day (BID) | CUTANEOUS | Status: DC
  Filled 2015-10-09: qty 22

## 2015-10-09 MED ORDER — SIMVASTATIN 10 MG PO TABS
10.0000 mg | ORAL_TABLET | Freq: Every day | ORAL | Status: DC
  Filled 2015-10-09: qty 1

## 2015-10-09 MED ORDER — IOHEXOL 300 MG/ML  SOLN
75.0000 mL | Freq: Once | INTRAMUSCULAR | Status: AC | PRN
  Administered 2015-10-09: 75 mL via INTRAVENOUS

## 2015-10-09 MED ORDER — DIPHENOXYLATE-ATROPINE 2.5-0.025 MG PO TABS: 2.0000 | ORAL_TABLET | Freq: Two times a day (BID) | ORAL | Status: DC | PRN

## 2015-10-09 MED ORDER — FUROSEMIDE 40 MG PO TABS
40.0000 mg | ORAL_TABLET | Freq: Every day | ORAL | Status: DC
  Administered 2015-10-12: 40 mg via ORAL
  Filled 2015-10-09: qty 1

## 2015-10-09 MED ORDER — SUMATRIPTAN SUCCINATE 50 MG PO TABS: 25.0000 mg | ORAL_TABLET | ORAL | Status: DC | PRN

## 2015-10-09 MED ORDER — MINERAL OIL RE ENEM
1.0000 | ENEMA | Freq: Once | RECTAL | Status: AC
  Administered 2015-10-09: 1 via RECTAL

## 2015-10-09 NOTE — ED Notes (Signed)
Pt placed onto bedpan and taken off by Kathlene NovemberMike, EDT

## 2015-10-09 NOTE — H&P (Addendum)
[facility] [place] at [facility]   PATIENT NAME: Cheryl [name]    MR#:  [id number]  DATE OF BIRTH:  03/18/1951  DATE OF ADMISSION:  10/09/2015  PRIMARY CARE PHYSICIAN: Mila Merry, MD   REQUESTING/REFERRING PHYSICIAN: Governor Rooks, MD  CHIEF COMPLAINT:   Chief Complaint  Patient presents with  . Altered Mental Status   Confusion today. HISTORY OF PRESENT ILLNESS:  Cheryl [name]  is a 64 y.o. female with a known history of hypertension, COPD, PAD, CAD, dementia and chronic pain syndrome. The patient was sent to ED due to confusion today. The patient  is confused and does not answer questions. According to the patient's husband, the patient was fine until this morning. She was found confused and complaining of chest and abdominal pain. The patient had multiple episodes similar problem before without causes found, after hospitalization for several days each time. Workup including CAT scan of the head, chest x-ray, lab and urinalysis in the ED so far are negative. Since the patient isn't able to be sent home in this condition, Dr. Shaune Pollack, ED physician,  request hospitalization for management until the patient is back to her baseline. The patient is taking many pain medications. I suspect that altered mental status is possibly due to the pain medications, but her husband said she has been on these medications for a long time without problems. PAST MEDICAL HISTORY:   Past Medical History  Diagnosis Date  . COPD (chronic obstructive pulmonary disease) (HCC)   . Hypertension   . Arthritis   . Hyperlipidemia   . GERD (gastroesophageal reflux disease)   . Neuropathy (HCC) 2010  . Oxygen deficiency   . Coronary artery disease   . Headache   . Anxiety   . Peripheral vascular disease (HCC)   . Vitamin D deficiency   . Migraines   . Chronic pain   . DVT (deep venous thrombosis) (HCC)   . Pneumonia   . Osteoporosis   . Allergy   . Asthma     PAST SURGICAL  HISTORY:   Past Surgical History  Procedure Laterality Date  . Appendectomy    . Spine surgery    . Foot surgery Bilateral     5-6 years per patient  . Cardiac catherization  10/31/2009  . Abdomnal aortic stent  05/30/2008    Dr. Nanetta Batty  . Abdominal hysterectomy  1975    Bilaterl Oophorectomy; Dur to IUD infection  . Cholecystectomy  1972  . Cervical fusion  C5 - 6/C6-7  . Appendectomy    . Colonoscopy with propofol N/A 07/27/2015    Procedure: COLONOSCOPY WITH PROPOFOL;  Surgeon: Wallace Cullens, MD;  Location: [facility] ENDOSCOPY;  Service: Gastroenterology;  Laterality: N/A;  . Esophagogastroduodenoscopy (egd) with propofol N/A 07/27/2015    Procedure: ESOPHAGOGASTRODUODENOSCOPY (EGD) WITH PROPOFOL;  Surgeon: Wallace Cullens, MD;  Location: [facility] ENDOSCOPY;  Service: Gastroenterology;  Laterality: N/A;    SOCIAL HISTORY:   Social History  Substance Use Topics  . Smoking status: Current Every Day Smoker -- 1.00 packs/day for 50 years    Types: Cigarettes  . Smokeless tobacco: Not on file  . Alcohol Use: No    FAMILY HISTORY:   Family History  Problem Relation Age of Onset  . Cancer Mother   . Arthritis Mother   . Heart disease Mother   . Diabetes Mother     mellitus, type 2  . Heart disease Father   . Diabetes Sister   .  Cancer Brother   . Cancer Brother     lung  . Diabetes Brother     DRUG ALLERGIES:   Allergies  Allergen Reactions  . Percocet [Oxycodone-Acetaminophen] Hives  . Aspirin Nausea Only and Other (See Comments)    Reaction:  GI upset   . Codeine Nausea Only and Other (See Comments)    Reaction:  GI upset   . Morphine Other (See Comments)    Reaction:  Unknown   . Pregabalin Other (See Comments)    Reaction:  Makes pt sleep excessively.    . Propoxyphene Other (See Comments)    Reaction:  GI upset   . Sulfa Antibiotics Rash and Other (See Comments)    Reaction:  GI upset     REVIEW OF SYSTEMS:  Unable to obtain ROS.  MEDICATIONS AT HOME:    Prior to Admission medications   Medication Sig Start Date End Date Taking? Authorizing Provider  acetaminophen (TYLENOL) 500 MG tablet Take 1,000 mg by mouth every 6 (six) hours as needed for mild pain or headache.    Yes Historical Provider, MD  ADVAIR DISKUS 250-50 MCG/DOSE AEPB inhale 1 dose by mouth twice a day 08/24/15  Yes Malva Limes, MD  albuterol (PROVENTIL HFA;VENTOLIN HFA) 108 (90 BASE) MCG/ACT inhaler Inhale 2 puffs into the lungs every 4 (four) hours as needed for wheezing or shortness of breath. 08/11/15  Yes Malva Limes, MD  ALPRAZolam Prudy Feeler) 0.5 MG tablet take 1 tablet by mouth every 6 hours if needed Patient taking differently: take 1 tablet by mouth every 4 hours as needed for anxiety 09/14/15  Yes Malva Limes, MD  celecoxib (CELEBREX) 200 MG capsule Take 200 mg by mouth daily.   Yes Historical Provider, MD  clopidogrel (PLAVIX) 75 MG tablet Take 75 mg by mouth daily.   Yes Historical Provider, MD  diphenoxylate-atropine (LOMOTIL) 2.5-0.025 MG per tablet Take 2 tablets by mouth 2 (two) times daily as needed for diarrhea or loose stools.    Yes Historical Provider, MD  donepezil (ARICEPT) 10 MG tablet Take 10 mg by mouth at bedtime.   Yes Historical Provider, MD  estropipate (OGEN) 0.75 MG tablet Take 0.75 mg by mouth daily.   Yes Historical Provider, MD  fentaNYL (DURAGESIC - DOSED MCG/HR) 50 MCG/HR Place 1 patch (50 mcg total) onto the skin every 3 (three) days. 09/04/15  Yes Delano Metz, MD  ferrous sulfate 325 (65 FE) MG tablet Take 325 mg by mouth daily with breakfast.   Yes Historical Provider, MD  furosemide (LASIX) 40 MG tablet Take 40 mg by mouth daily.   Yes Historical Provider, MD  lansoprazole (PREVACID) 30 MG capsule Take 30 mg by mouth daily.    Yes Historical Provider, MD  meloxicam (MOBIC) 15 MG tablet Take 1 tablet (15 mg total) by mouth daily. 09/04/15  Yes Delano Metz, MD  mupirocin ointment (BACTROBAN) 2 % Apply 1 application topically  2 (two) times daily.    Yes Historical Provider, MD  pantoprazole (PROTONIX) 40 MG tablet Take 40 mg by mouth daily.   Yes Historical Provider, MD  pregabalin (LYRICA) 150 MG capsule Take 1 capsule (150 mg total) by mouth 2 (two) times daily. 09/04/15  Yes Delano Metz, MD  silver sulfADIAZINE (SILVADENE) 1 % cream Apply 1 application topically daily.   Yes Historical Provider, MD  simvastatin (ZOCOR) 10 MG tablet Take 10 mg by mouth at bedtime.    Yes Historical Provider, MD  SUMAtriptan (IMITREX) 25  MG tablet Take 25 mg by mouth as needed for migraine. May repeat in 2 hours if headache persists or recurs.   Yes Historical Provider, MD  tiotropium (SPIRIVA) 18 MCG inhalation capsule Place 18 mcg into inhaler and inhale daily.   Yes Historical Provider, MD  traMADol (ULTRAM) 50 MG tablet Take 2 tablets (100 mg total) by mouth every 6 (six) hours as needed. Patient taking differently: Take 100 mg by mouth 4 (four) times daily.  09/04/15  Yes Delano MetzFrancisco Naveira, MD  amoxicillin-clavulanate (AUGMENTIN) 875-125 MG tablet Take 1 tablet by mouth 2 (two) times daily. Patient not taking: Reported on 10/09/2015 09/19/15   Malva Limesonald E Fisher, MD      VITAL SIGNS:  Blood pressure 136/63, pulse 64, temperature 98.3 F (36.8 C), temperature source Oral, resp. rate 23, weight 42.6 kg (93 lb 14.7 oz), SpO2 94 %.  PHYSICAL EXAMINATION:  GENERAL:  64 y.o.-year-old patient lying in the bed, confused and the somnolence. EYES: Unable to exam. HEENT: Head atraumatic, normocephalic.  NECK:  Supple, no jugular venous distention. No thyroid enlargement, no tenderness.  LUNGS: Normal breath sounds bilaterally, no wheezing, rales,rhonchi or crepitation. No use of accessory muscles of respiration.  CARDIOVASCULAR: S1, S2 normal. No murmurs, rubs, or gallops.  ABDOMEN: Soft, nontender, nondistended. Bowel sounds present.  EXTREMITIES: No pedal edema, cyanosis, or clubbing.  NEUROLOGIC: Unable to exam. PSYCHIATRIC:  The patient is confused and somnolence,  does not answer questions and does not follow command.  SKIN: No obvious rash, lesion, or ulcer.   LABORATORY PANEL:   CBC  Recent Labs Lab 10/09/15 0855  WBC 9.6  HGB 11.1*  HCT 34.1*  PLT 235   ------------------------------------------------------------------------------------------------------------------  Chemistries   Recent Labs Lab 10/09/15 0855  NA 138  K 3.7  CL 104  CO2 26  GLUCOSE 106*  BUN 12  CREATININE 0.75  CALCIUM 8.8*  AST 17  ALT 9*  ALKPHOS 86  BILITOT 0.3   ------------------------------------------------------------------------------------------------------------------  Cardiac Enzymes  Recent Labs Lab 10/09/15 0855  TROPONINI <0.03   ------------------------------------------------------------------------------------------------------------------  RADIOLOGY:  Ct Head Wo Contrast  10/09/2015  CLINICAL DATA:  Altered mental status for the past 2 days. History of dementia. EXAM: CT HEAD WITHOUT CONTRAST TECHNIQUE: Contiguous axial images were obtained from the base of the skull through the vertex without intravenous contrast. COMPARISON:  07/31/2015; 02/03/2015 FINDINGS: Rather extensive periventricular hypodensities are grossly unchanged and compatible microvascular ischemic disease. The gray-white differentiation is otherwise well maintained without CT evidence of acute large territory infarct. No intraparenchymal or extra-axial mass or hemorrhage. Unchanged size and configuration of the ventricles and basilar cisterns. There is persistent underpneumatization of the bilateral frontal sinuses. The remaining paranasal sinuses and mastoid air cells are normally aerated. No air-fluid levels. Regional soft tissues appear normal. No displaced calvarial fracture. IMPRESSION: Similar findings of microvascular ischemic disease without definitive superimposed acute intracranial process. Electronically Signed   By:  Simonne ComeJohn  Watts M.D.   On: 10/09/2015 10:01   Ct Abdomen Pelvis W Contrast  10/09/2015  CLINICAL DATA:  Unremarkable. Cough for several days. Abdominal pain. EXAM: CT ABDOMEN AND PELVIS WITH CONTRAST TECHNIQUE: Multidetector CT imaging of the abdomen and pelvis was performed using the standard protocol following bolus administration of intravenous contrast. CONTRAST:  75mL OMNIPAQUE IOHEXOL 300 MG/ML  SOLN COMPARISON:  08/20/2014 FINDINGS: Lower chest: No pleural fluid identified. There is moderate cardiac enlargement. Moderate size hiatal hernia noted. No airspace consolidation identified within the lung bases. Hepatobiliary: No focal liver abnormality. The  scratch set there is mild intrahepatic bile duct dilatation. The common bile duct is increased in caliber measuring 9 mm, previously 5 mm. Prior cholecystectomy. Pancreas: Unremarkable appearance of the pancreas. Spleen: The spleen is negative. Adrenals/Urinary Tract: The adrenal glands are both normal. Normal appearance of both kidneys. Urinary bladder appears within normal limits. Stomach/Bowel: Moderate size hiatal hernia. The stomach is within normal limits. The small bowel loops have a normal course and caliber. No obstruction. Moderate stool burden identified throughout the colon. Vascular/Lymphatic: Calcified atherosclerotic disease involves the abdominal aorta. No aneurysm. No enlarged retroperitoneal or mesenteric adenopathy. No enlarged pelvic or inguinal lymph nodes. Reproductive: Previous hysterectomy.  No adnexal mass noted. Other: No free fluid or fluid collections within the upper abdomen or pelvis. Musculoskeletal: Lumbar scoliosis and degenerative disc disease is present. IMPRESSION: 1. No acute findings identified within the abdomen or pelvis. 2. Progressive intrahepatic and extrahepatic bile duct dilatation status post cholecystectomy. Correlate for any clinical signs or symptoms of biliary obstruction. 3. Aortic atherosclerosis 4. Hiatal  hernia 5. Moderate stool burden. Correlate for any clinical signs or symptoms of constipation. 6. Cardiac enlargement. Electronically Signed   By: Signa Kell M.D.   On: 10/09/2015 13:46   Dg Chest Port 1 View  10/09/2015  CLINICAL DATA:  Cough.  Altered mental status. EXAM: PORTABLE CHEST 1 VIEW COMPARISON:  07/31/2015 and 03/01/2015 FINDINGS: There are small areas of atelectasis in both upper lobes. There is an area of scarring at the left lung base, probably in the lingula, as well as an area of scarring at the left base posterior medially. No acute osseous abnormality. Calcification in the thoracic aorta. Vascularity is normal. IMPRESSION: Minimal new atelectasis in the upper lobes. Scarring at the left lung base. Electronically Signed   By: Francene Boyers M.D.   On: 10/09/2015 09:32    EKG:   Orders placed or performed during the hospital encounter of 10/09/15  . ED EKG  . ED EKG    IMPRESSION AND PLAN:   Altered mental status, suspected due to pain medications. Hypertension COPD Neuropathy CAD Chronic pain  The patient will be placed for observation. I will start IV fluid support, hold Mobic, Lyrica, Celebrex and tramadol but continue fentanyl patch. CAD, Continue Plavix, Zocor and hypertension medication. COPD, stable, continue nebulizer treatment.  All the records are reviewed and case discussed with ED provider. Management plans discussed with the patient's and and he is in agreement.  CODE STATUS: Full code  TOTAL TIME TAKING CARE OF THIS PATIENT: 53 minutes.    Shaune Pollack M.D on 10/09/2015 at 3:30 PM  Between 7am to 6pm - Pager - [phone]  After 6pm go to [email or web address] - password [email or web address] [facility]  [place] [facility]  Office  [phone]  CC: Primary care physician; Mila Merry, MD

## 2015-10-09 NOTE — ED Notes (Signed)
Pt to ed brought in by her daughter which states pt not acting right and with cough for two days. Pt with hx dementia.  Staff members had to pull pt out of car in which she was not responding to her daughter at time of arrival.  Pt brought straight back to er room 18, pt curled up on stretcher in the fetal position with eyes closed only responding to sternal rub. Pt noted to have wet cough, skin warm and dry. Pt wil occasionally moan and say she is cold.  Daughter at bedside. Protocols initiated.

## 2015-10-09 NOTE — ED Provider Notes (Addendum)
[facility] Emergency Department Provider Note   ____________________________________________  Time seen: 8:50 AM I have reviewed the triage vital signs and the triage nursing note.  HISTORY  Chief Complaint Altered Mental Status  altered mental status   Historian Limited: Patient poor historian due to altered mental status Daughter provides some history  HPI Cheryl [name] is a 64 y.o. female who has underlying dementia, and daughter brought her in for evaluation due to complaining of chest and abdominal pain as well as altered mental status. Daughter states that patient has had multiple episodes like this before for which no certain cause has been found, after hospitalization for several days each time.  Daughter states patient was complaining of some abdominal discomfort and nausea yesterday. No vomiting or diarrhea. No known fever. She does have a cough for which she's been on antibiotics for several days. Daughter states patient had described some chest discomfort at one point time yesterday. This morning the patient has been writhing around in the plenty of pain all over when the daughter touches her, and so she brought her in for evaluation.    Past Medical History  Diagnosis Date  . COPD (chronic obstructive pulmonary disease) (HCC)   . Hypertension   . Arthritis   . Hyperlipidemia   . GERD (gastroesophageal reflux disease)   . Neuropathy (HCC) 2010  . Oxygen deficiency   . Coronary artery disease   . Headache   . Anxiety   . Peripheral vascular disease (HCC)   . Vitamin D deficiency   . Migraines   . Chronic pain   . DVT (deep venous thrombosis) (HCC)   . Pneumonia   . Osteoporosis   . Allergy   . Asthma     Patient Active Problem List   Diagnosis Date Noted  . Lumbar radicular pain 09/04/2015  . Chronic low back pain 09/04/2015  . Other long term (current) drug therapy 09/04/2015  . Encounter for long-term opiate analgesic use  09/04/2015  . Encounter for therapeutic drug level monitoring 09/04/2015  . Opiate use 09/04/2015  . Uncomplicated opioid dependence (HCC) 09/04/2015  . Chronic pain syndrome 09/04/2015  . Platelet inhibition due to Plavix 09/04/2015  . COPD (chronic obstructive pulmonary disease) (HCC) 07/31/2015  . Dementia 07/31/2015  . Gastrointestinal complaints, nonspecific 07/31/2015  . Absolute anemia 07/03/2015  . Airway hyperreactivity 07/03/2015  . Back pain, thoracic 07/03/2015  . Diaphoresis 07/03/2015  . Excessive falling 07/03/2015  . Alteration in bowel elimination: incontinence 07/03/2015  . Insomnia 07/03/2015  . Decreased testosterone level 07/03/2015  . Leg weakness 07/03/2015  . Menopausal symptom 07/03/2015  . Migraine 07/03/2015  . Neuropathy (HCC) 07/03/2015  . Fecal occult blood test positive 07/03/2015  . OP (osteoporosis) 07/03/2015  . Episodic paroxysmal anxiety disorder 07/03/2015  . Episode of syncope 07/03/2015  . Compulsive tobacco user syndrome 07/03/2015  . Urinary incontinence 07/03/2015  . Weight loss 07/03/2015  . Essential hypertension 06/06/2015  . GERD (gastroesophageal reflux disease) 06/06/2015  . Hyperlipemia 06/06/2015  . Disorder of peripheral nervous system (HCC) 04/13/2014  . 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 Anxiety 02/10/2014  . Coronary artery disease 02/10/2014  . Peripheral blood vessel disorder (HCC) 02/10/2014  . Hypercholesteremia 02/10/2014  . Peripheral vascular disease (HCC) 02/10/2014  . Vitamin D deficiency 10/16/2009    Past Surgical History  Procedure Laterality Date  . Appendectomy    . Spine surgery    . Foot surgery Bilateral     5-6  years per patient  . Cardiac catherization  10/31/2009  . Abdomnal aortic stent  05/30/2008    Dr. Nanetta Batty  . Abdominal hysterectomy  1975    Bilaterl Oophorectomy; Dur to IUD infection  . Cholecystectomy  1972  . Cervical fusion  C5 - 6/C6-7  . Appendectomy    .  Colonoscopy with propofol N/A 07/27/2015    Procedure: COLONOSCOPY WITH PROPOFOL;  Surgeon: Wallace Cullens, MD;  Location[facility] ENDOSCOPY;  Service: Gastroenterology;  Laterality: N/A;  . Esophagogastroduodenoscopy (egd) with propofol N/A 07/27/2015    Procedure: ESOPHAGOGASTRODUODENOSCOPY (EGD) WITH PROPOFOL;  Surgeon: Wallace Cullens, MD;  Location: [facility] ENDOSCOPY;  Service: Gastroenterology;  Laterality: N/A;    Current Outpatient Rx  Name  Route  Sig  Dispense  Refill  . acetaminophen (TYLENOL) 500 MG tablet   Oral   Take 1,000 mg by mouth every 6 (six) hours as needed for mild pain or headache.          . ADVAIR DISKUS 250-50 MCG/DOSE AEPB      inhale 1 dose by mouth twice a day   60 each   11   . albuterol (PROVENTIL HFA;VENTOLIN HFA) 108 (90 BASE) MCG/ACT inhaler   Inhalation   Inhale 2 puffs into the lungs every 4 (four) hours as needed for wheezing or shortness of breath.   18 g   3   . ALPRAZolam (XANAX) 0.5 MG tablet      take 1 tablet by mouth every 6 hours if needed Patient taking differently: take 1 tablet by mouth every 4 hours as needed for anxiety   60 tablet   5   . celecoxib (CELEBREX) 200 MG capsule   Oral   Take 200 mg by mouth daily.         . clopidogrel (PLAVIX) 75 MG tablet   Oral   Take 75 mg by mouth daily.         . diphenoxylate-atropine (LOMOTIL) 2.5-0.025 MG per tablet   Oral   Take 2 tablets by mouth 2 (two) times daily as needed for diarrhea or loose stools.          . donepezil (ARICEPT) 10 MG tablet   Oral   Take 10 mg by mouth at bedtime.         Marland Kitchen estropipate (OGEN) 0.75 MG tablet   Oral   Take 0.75 mg by mouth daily.         . fentaNYL (DURAGESIC - DOSED MCG/HR) 50 MCG/HR   Transdermal   Place 1 patch (50 mcg total) onto the skin every 3 (three) days.   10 patch   0     Do not place this medication, or any other prescri ...   . ferrous sulfate 325 (65 FE) MG tablet   Oral   Take 325 mg by mouth daily with breakfast.          . furosemide (LASIX) 40 MG tablet   Oral   Take 40 mg by mouth daily.         . lansoprazole (PREVACID) 30 MG capsule   Oral   Take 30 mg by mouth daily.          . meloxicam (MOBIC) 15 MG tablet   Oral   Take 1 tablet (15 mg total) by mouth daily.   30 tablet   2     Do not place this medication, or any other prescri ...   . mupirocin ointment (  BACTROBAN) 2 %   Topical   Apply 1 application topically 2 (two) times daily.          . pantoprazole (PROTONIX) 40 MG tablet   Oral   Take 40 mg by mouth daily.         . pregabalin (LYRICA) 150 MG capsule   Oral   Take 1 capsule (150 mg total) by mouth 2 (two) times daily.   60 capsule   2     Do not place this medication, or any other prescri ...   . silver sulfADIAZINE (SILVADENE) 1 % cream   Topical   Apply 1 application topically daily.         . simvastatin (ZOCOR) 10 MG tablet   Oral   Take 10 mg by mouth at bedtime.          . SUMAtriptan (IMITREX) 25 MG tablet   Oral   Take 25 mg by mouth as needed for migraine. May repeat in 2 hours if headache persists or recurs.         Marland Kitchen. tiotropium (SPIRIVA) 18 MCG inhalation capsule   Inhalation   Place 18 mcg into inhaler and inhale daily.         . traMADol (ULTRAM) 50 MG tablet   Oral   Take 2 tablets (100 mg total) by mouth every 6 (six) hours as needed. Patient taking differently: Take 100 mg by mouth 4 (four) times daily.    240 tablet   2     Do not place this medication, or any other prescri ...   . amoxicillin-clavulanate (AUGMENTIN) 875-125 MG tablet   Oral   Take 1 tablet by mouth 2 (two) times daily. Patient not taking: Reported on 10/09/2015   28 tablet   0     Allergies Percocet; Aspirin; Codeine; Morphine; Pregabalin; Propoxyphene; and Sulfa antibiotics  Family History  Problem Relation Age of Onset  . Cancer Mother   . Arthritis Mother   . Heart disease Mother   . Diabetes Mother     mellitus, type 2  . Heart  disease Father   . Diabetes Sister   . Cancer Brother   . Cancer Brother     lung  . Diabetes Brother     Social History Social History  Substance Use Topics  . Smoking status: Current Every Day Smoker -- 1.00 packs/day for 50 years    Types: Cigarettes  . Smokeless tobacco: None  . Alcohol Use: No    Review of Systems Limited Constitutional: Negative for fever. Eyes:  ENT:  Cardiovascular: See history of present illness. Respiratory: Chronic cough Gastrointestinal: See history of present illness Genitourinary: Negative for dysuria. Musculoskeletal: Negative for back pain. Skin: Negative for rash. Neurological: Negative for headache.  ____________________________________________   PHYSICAL EXAM:  VITAL SIGNS: ED Triage Vitals  Enc Vitals Group     BP --      Pulse --      Resp --      Temp --      Temp src --      SpO2 --      Weight --      Height --      Head Cir --      Peak Flow --      Pain Score --      Pain Loc --      Pain Edu? --      Excl. in GC? --  Constitutional: Follow some commands. Refuses to open eyes. In no acute distress. Eyes: Conjunctivae are normal. PERRL. Normal extraocular movements. ENT   Head: Normocephalic and atraumatic.   Nose: No congestion/rhinnorhea.   Mouth/Throat: Mucous membranes are mildly dry.   Neck: No stridor. Cardiovascular/Chest: Normal rate, regular rhythm.  No murmurs, rubs, or gallops. Respiratory: Normal respiratory effort without tachypnea nor retractions. Moderate rhonchi especially with cough bilateral posteriorly. Gastrointestinal: Soft. No distention, no guarding, no rebound. Mild tenderness diffusely.  Genitourinary/rectal:Deferred Musculoskeletal: Nontender with normal range of motion in all extremities. No joint effusions.  No lower extremity tenderness.  No edema. Neurologic:  Normal speech and language. No gross or focal neurologic deficits are appreciated. Skin:  Skin is warm,  dry and intact. No rash noted. Psychiatric: Mood and affect are normal. Speech and behavior are normal. Patient exhibits appropriate insight and judgment.  ____________________________________________   EKG I, Governor Rooks, MD, the attending physician have personally viewed and interpreted all ECGs.  59 bpm. Sinus rhythm. Narrow QRS. Normal axis. Nonspecific T-wave ____________________________________________  LABS (pertinent positives/negatives)  White blood cell count 9.6, hemoglobin 1.1, platelet count 235 Comprehensive metabolic panel without significant abnormality Lipase 20 Troponin less than 0.03 Lactate 0.9 Urinalysis trace ketones and otherwise negative  ____________________________________________  RADIOLOGY All Xrays were viewed by me. Imaging interpreted by Radiologist.  Chest x-ray portable: Minimal new atelectasis in the upper lobes. Scarring at the left lung base  CT head noncontrast: Similar findings of microvascular ischemic disease without definitive superimposed acute intracranial process  CT abdomen and pelvis with contrast:   IMPRESSION: 1. No acute findings identified within the abdomen or pelvis. 2. Progressive intrahepatic and extrahepatic bile duct dilatation status post cholecystectomy. Correlate for any clinical signs or symptoms of biliary obstruction. 3. Aortic atherosclerosis 4. Hiatal hernia 5. Moderate stool burden. Correlate for any clinical signs or symptoms of constipation. 6. Cardiac enlargement.  __________________________________________  PROCEDURES  Procedure(s) performed: None  Critical Care performed: None  ____________________________________________   ED COURSE / ASSESSMENT AND PLAN  CONSULTATIONS: Hospitalist for admission  Pertinent labs & imaging results that were available during my care of the patient were reviewed by me and considered in my medical decision making (see chart for details).   This patient  does have altered mental status from the standpoint that she is not opening her eyes, is only intermittently following commands, but at times she is making sense like saying that she is cold and don't uncover me. It sounds like the main symptom is abdominal pain that started 1-2 days ago. She does follow with a GI doctor. Family states that she's been on antibiotic for a cough. She's not had any new trouble breathing or fever.  Initial laboratory evaluation showed no certain cause, direction, or red flag. Chest x-ray and CT head were unremarkable for acute cause for her symptoms today.  I discussed with the family obtaining a CT of abdomen given that she is still having pretty significant abdominal pain when I palpate her abdomen.  Patient is unable to easily follow directions, eat and drink, or stand, and she is unable to be sent home in this condition. Patient will need hospitalization for management until patient is back to baseline.  Family states this has happened in the past and she is received IV fluids for 1-3 days and then return to normal.  CT abdomen shows no acute source of her pain. Patient is still not fully following commands, and will not or cannot stand up, nor  eat or drink. Patient will be admitted overnight for observation.  Patient / Family / Caregiver informed of clinical course, medical decision-making process, and agree with plan.   ___________________________________________   FINAL CLINICAL IMPRESSION(S) / ED DIAGNOSES   Final diagnoses:  Altered mental status, unspecified altered mental status type  Generalized abdominal pain       Governor Rooks, MD 10/09/15 1440  Governor Rooks, MD 10/09/15 [phone]

## 2015-10-09 NOTE — ED Notes (Signed)
Report given to Stephen RN.

## 2015-10-09 NOTE — Care Management Obs Status (Signed)
MEDICARE OBSERVATION STATUS NOTIFICATION   Patient Details  Name: Cheryl [name] MRN: [id number] Date of Birth: 05-25-1951   Medicare Observation Status Notification Given:  Yes Letter to spouse at bedside in room, due to AMS of the pt.    [name], RN 10/09/2015, 2:49 PM

## 2015-10-09 NOTE — Progress Notes (Signed)
Pt. Had a BM, brown. Med and hard. She noted that she had more and it won't come out and that we need to go in ane take it out. I did see stool at the rectum but it seemed to go back in. MD notified and an enema was ordered.

## 2015-10-10 ENCOUNTER — Other Ambulatory Visit: Payer: Self-pay

## 2015-10-10 DIAGNOSIS — R928 Other abnormal and inconclusive findings on diagnostic imaging of breast: Secondary | ICD-10-CM | POA: Diagnosis not present

## 2015-10-10 DIAGNOSIS — N63 Unspecified lump in breast: Secondary | ICD-10-CM | POA: Diagnosis not present

## 2015-10-10 DIAGNOSIS — R410 Disorientation, unspecified: Secondary | ICD-10-CM

## 2015-10-10 DIAGNOSIS — J441 Chronic obstructive pulmonary disease with (acute) exacerbation: Secondary | ICD-10-CM

## 2015-10-10 DIAGNOSIS — K838 Other specified diseases of biliary tract: Secondary | ICD-10-CM | POA: Diagnosis not present

## 2015-10-10 DIAGNOSIS — R52 Pain, unspecified: Secondary | ICD-10-CM | POA: Diagnosis not present

## 2015-10-10 DIAGNOSIS — G629 Polyneuropathy, unspecified: Secondary | ICD-10-CM | POA: Diagnosis not present

## 2015-10-10 DIAGNOSIS — D649 Anemia, unspecified: Secondary | ICD-10-CM | POA: Diagnosis not present

## 2015-10-10 DIAGNOSIS — R4182 Altered mental status, unspecified: Secondary | ICD-10-CM | POA: Diagnosis not present

## 2015-10-10 DIAGNOSIS — J209 Acute bronchitis, unspecified: Secondary | ICD-10-CM | POA: Diagnosis not present

## 2015-10-10 DIAGNOSIS — M549 Dorsalgia, unspecified: Secondary | ICD-10-CM | POA: Diagnosis not present

## 2015-10-10 LAB — URINE DRUG SCREEN, QUALITATIVE (ARMC ONLY)
AMPHETAMINES, UR SCREEN: NOT DETECTED
BENZODIAZEPINE, UR SCRN: NOT DETECTED
Barbiturates, Ur Screen: NOT DETECTED
Cannabinoid 50 Ng, Ur ~~LOC~~: NOT DETECTED
Cocaine Metabolite,Ur ~~LOC~~: NOT DETECTED
MDMA (Ecstasy)Ur Screen: NOT DETECTED
METHADONE SCREEN, URINE: NOT DETECTED
Opiate, Ur Screen: NOT DETECTED
PHENCYCLIDINE (PCP) UR S: NOT DETECTED
Tricyclic, Ur Screen: NOT DETECTED

## 2015-10-10 LAB — AMMONIA: AMMONIA: 13 umol/L (ref 9–35)

## 2015-10-10 MED ORDER — AZITHROMYCIN 250 MG PO TABS
500.0000 mg | ORAL_TABLET | Freq: Every day | ORAL | Status: AC
Start: 2015-10-10 — End: 2015-10-11

## 2015-10-10 MED ORDER — LORAZEPAM 2 MG/ML IJ SOLN: 0.5000 mg | INTRAMUSCULAR | Status: DC

## 2015-10-10 MED ORDER — AZITHROMYCIN 250 MG PO TABS
250.0000 mg | ORAL_TABLET | Freq: Every day | ORAL | Status: DC
  Administered 2015-10-12: 250 mg via ORAL
  Filled 2015-10-10: qty 1

## 2015-10-10 MED ORDER — ENOXAPARIN SODIUM 40 MG/0.4ML ~~LOC~~ SOLN
40.0000 mg | SUBCUTANEOUS | Status: DC
  Administered 2015-10-11: 40 mg via SUBCUTANEOUS
  Filled 2015-10-10 (×2): qty 0.4

## 2015-10-10 NOTE — Patient Outreach (Signed)
[facility] Care Management  10/10/2015  Cheryl [name] [birth year] [id number]   Patient noted to be admitted to the hospital on 10-09-15 for altered mental status.  Message sent to hospital liaison notifying of admission.    Plan: RN Health Coach will send health coach closure letter to physician.    Bary Lericheionne J Rami Budhu, RN, MSN [facility] Care Management RN Telephonic Health Coach [phone]

## 2015-10-10 NOTE — Consult Note (Signed)
[facility] Consult   Reason for Consult:  Consult for this 64 year old woman with on a pain and multiple medical problems currently in the hospital for acute delirium Referring Physician:  Ether Griffins Patient Identification: Cheryl [name] MRN:  [id number] Principal Diagnosis: Acute delirium Diagnosis:   Patient Active Problem List   Diagnosis Date Noted  . Acute delirium [R41.0] 10/10/2015  . Altered mental status [R41.82] 10/09/2015  . Lumbar radicular pain [M54.16] 09/04/2015  . Chronic low back pain [M54.5, G89.29] 09/04/2015  . Other long term (current) drug therapy [Z79.899] 09/04/2015  . Encounter for long-term opiate analgesic use [Z79.891] 09/04/2015  . Encounter for therapeutic drug level monitoring [Z51.81] 09/04/2015  . Opiate use [F11.90] 09/04/2015  . Uncomplicated opioid dependence ([place]) [F11.20] 09/04/2015  . Chronic pain syndrome [G89.4] 09/04/2015  . Platelet inhibition due to Plavix [D69.59, T45.515A] 09/04/2015  . COPD (chronic obstructive pulmonary disease) ([place]) [J44.9] 07/31/2015  . Dementia [F03.90] 07/31/2015  . Gastrointestinal complaints, nonspecific [R19.8] 07/31/2015  . Absolute anemia [D64.9] 07/03/2015  . Airway hyperreactivity [J45.909] 07/03/2015  . Back pain, thoracic [M54.6] 07/03/2015  . Diaphoresis [R61] 07/03/2015  . Excessive falling [R29.6] 07/03/2015  . Alteration in bowel elimination: incontinence [R15.9] 07/03/2015  . Insomnia [G47.00] 07/03/2015  . Decreased testosterone level [E29.1] 07/03/2015  . Leg weakness [R29.898] 07/03/2015  . Menopausal symptom [N95.1] 07/03/2015  . Migraine [G43.909] 07/03/2015  . Neuropathy ([place]) [G62.9] 07/03/2015  . Fecal occult blood test positive [R19.5] 07/03/2015  . OP (osteoporosis) [M81.0] 07/03/2015  . 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 Episode of syncope [R55] 07/03/2015  . Compulsive tobacco user syndrome [F17.200] 07/03/2015  . Urinary incontinence [R32]  07/03/2015  . Weight loss [R63.4] 07/03/2015  . Essential hypertension [I10] 06/06/2015  . GERD (gastroesophageal reflux disease) [K21.9] 06/06/2015  . Hyperlipemia [E78.5] 06/06/2015  . Disorder of peripheral nervous system ([place]) [G64] 04/13/2014  . 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 Anxiety [F41.9] 02/10/2014  . Coronary artery disease [I25.10] 02/10/2014  . Peripheral blood vessel disorder ([place]) [I73.9] 02/10/2014  . Hypercholesteremia [E78.00] 02/10/2014  . Peripheral vascular disease ([place]) [I73.9] 02/10/2014  . Vitamin D deficiency [E55.9] 10/16/2009    Total Time spent with patient: 1 hour  Subjective:   Cheryl [name] is a 64 y.Cheryl [name]. female patient admitted with patient herself was not able to offer any information. History obtained from the husband. He states that she has been like this for a couple days and he anticipates that she is continuing to improve.  HPI:  Information from interview with the husband, review of the chart including old admissions and review of current labs and vital signs. This 64 year old woman came into the hospital yesterday morning with fairly acute onset of altered mental status. Husband reports that as has been the case multiple times before that she starts to get confused and does not know where she is, and in the early stages will asked to be brought to the hospital. Since yesterday morning she has been largely unresponsive. He says that she looks like she is a little bit more responsive this afternoon and he anticipates that Cheryl [name] will improve. He denies that there was any noticeable change in her mood anytime recently. He denies being aware of any change to her stress or new emotional problems. He also denies any change in her usual routine of her medication. Herself as mentioned is not able to give any history right now.  Social history: Patient lives with her husband.  Does not work. Sounds like they at baseline seemed to have a good  relationship.  Medical history: Patient has chronic back pain peripheral neuropathy COPD chronic pain syndrome, anemia, history of coronary artery disease. She is followed by the pain clinic.  Past Psychiatric History: Husband denies that the patient has ever been treated for depression or any other psychiatric problem although he acknowledges that she is prescribed Xanax for use as needed when she is more anxious. He says that usually she uses it when her pain is worse because that is when she will get anxious. He denies and there is no record of suicidal behavior or psychotic behavior in the past. No known psychiatric hospitalizations.  Risk to Self: Is patient at risk for suicide?: No Risk to Others:   Prior Inpatient Therapy:   Prior Outpatient Therapy:    Past Medical History:  Past Medical History  Diagnosis Date  . COPD (chronic obstructive pulmonary disease) ([place])   . Hypertension   . Arthritis   . Hyperlipidemia   . GERD (gastroesophageal reflux disease)   . Neuropathy ([place]) 2010  . Oxygen deficiency   . Coronary artery disease   . Headache   . Anxiety   . Peripheral vascular disease ([place])   . Vitamin D deficiency   . Migraines   . Chronic pain   . DVT (deep venous thrombosis) ([place])   . Pneumonia   . Osteoporosis   . Allergy   . Asthma     Past Surgical History  Procedure Laterality Date  . Appendectomy    . Spine surgery    . Foot surgery Bilateral     5-6 years per patient  . Cardiac catherization  10/31/2009  . Abdomnal aortic stent  05/30/2008    Dr. Quay Burow  . Abdominal hysterectomy  1975    Bilaterl Oophorectomy; [place] to IUD infection  . Cholecystectomy  1972  . Cervical fusion  C5 - 6/C6-7  . Appendectomy    . Colonoscopy with propofol N/A 07/27/2015    Procedure: COLONOSCOPY WITH PROPOFOL;  Surgeon: Hulen Luster, MD;  Location: [facility] ENDOSCOPY;  Service: Gastroenterology;  Laterality: N/A;  . Esophagogastroduodenoscopy (egd) with propofol N/A  07/27/2015    Procedure: ESOPHAGOGASTRODUODENOSCOPY (EGD) WITH PROPOFOL;  Surgeon: Hulen Luster, MD;  Location: [facility] ENDOSCOPY;  Service: Gastroenterology;  Laterality: N/A;   Family History:  Family History  Problem Relation Age of Onset  . Cancer Mother   . Arthritis Mother   . Heart disease Mother   . Diabetes Mother     mellitus, type 2  . Heart disease Father   . Diabetes Sister   . Cancer Brother   . Cancer Brother     lung  . Diabetes Brother    Family Psychiatric  History: Husband reports that the patient has a sister who has a diagnosis of bipolar disorder but according to the husband he has never seen anything particularly wrong with her. No other known psychiatric history in the family. Social History:  History  Alcohol Use No     History  Drug Use No    Social History   Social History  . Marital Status: Married    Spouse Name: N/A  . Number of Children: 6  . Years of Education: N/A   Occupational History  . Disabled    Social History Main Topics  . Smoking status: Current Every Day Smoker -- 1.00 packs/day for 50 years    Types: Cigarettes  .  Smokeless tobacco: None  . Alcohol Use: No  . Drug Use: No  . Sexual Activity: Not Asked   Other Topics Concern  . None   Social History Narrative   Additional Social History:                          Allergies:   Allergies  Allergen Reactions  . Percocet [Oxycodone-Acetaminophen] Hives  . Aspirin Nausea Only and Other (See Comments)    Reaction:  GI upset   . Codeine Nausea Only and Other (See Comments)    Reaction:  GI upset   . Morphine Other (See Comments)    Reaction:  Unknown   . Pregabalin Other (See Comments)    Reaction:  Makes pt sleep excessively.    . Propoxyphene Other (See Comments)    Reaction:  GI upset   . Sulfa Antibiotics Rash and Other (See Comments)    Reaction:  GI upset     Labs:  Results for orders placed or performed during the hospital encounter of 10/09/15 (from  the past 48 hour(s))  CBC with Differential     Status: Abnormal   Collection Time: 10/09/15  8:55 AM  Result Value Ref Range   WBC 9.6 3.6 - 11.0 K/uL   RBC 3.65 (L) 3.80 - 5.20 MIL/uL   Hemoglobin 11.1 (L) 12.0 - 16.0 g/dL   HCT 34.1 (L) 35.0 - 47.0 %   MCV 93.2 80.0 - 100.0 fL   MCH 30.3 26.0 - 34.0 pg   MCHC 32.5 32.0 - 36.0 g/dL   RDW 14.1 11.5 - 14.5 %   Platelets 235 150 - 440 K/uL   Neutrophils Relative % 85 %   Neutro Abs 8.1 (H) 1.4 - 6.5 K/uL   Lymphocytes Relative 6 %   Lymphs Abs 0.6 (L) 1.0 - 3.6 K/uL   Monocytes Relative 9 %   Monocytes Absolute 0.8 0.2 - 0.9 K/uL   Eosinophils Relative 0 %   Eosinophils Absolute 0.0 0 - 0.7 K/uL   Basophils Relative 0 %   Basophils Absolute 0.0 0 - 0.1 K/uL  Comprehensive metabolic panel     Status: Abnormal   Collection Time: 10/09/15  8:55 AM  Result Value Ref Range   Sodium 138 135 - 145 mmol/L   Potassium 3.7 3.5 - 5.1 mmol/L   Chloride 104 101 - 111 mmol/L   CO2 26 22 - 32 mmol/L   Glucose, Bld 106 (H) 65 - 99 mg/dL   BUN 12 6 - 20 mg/dL   Creatinine, Ser 0.75 0.44 - 1.00 mg/dL   Calcium 8.8 (L) 8.9 - 10.3 mg/dL   Total Protein 7.3 6.5 - 8.1 g/dL   Albumin 3.6 3.5 - 5.0 g/dL   AST 17 15 - 41 U/L   ALT 9 (L) 14 - 54 U/L   Alkaline Phosphatase 86 38 - 126 U/L   Total Bilirubin 0.3 0.3 - 1.2 mg/dL   GFR calc non Af Amer >60 >60 mL/min   GFR calc Af Amer >60 >60 mL/min    Comment: (NOTE) The eGFR has been calculated using the CKD EPI equation. This calculation has not been validated in all clinical situations. eGFR's persistently <60 mL/min signify possible Chronic Kidney Disease.    Anion gap 8 5 - 15  Lipase, blood     Status: None   Collection Time: 10/09/15  8:55 AM  Result Value Ref Range   Lipase 20 11 -  51 U/L  Troponin I     Status: None   Collection Time: 10/09/15  8:55 AM  Result Value Ref Range   Troponin I <0.03 <0.031 ng/mL    Comment:        NO INDICATION OF MYOCARDIAL INJURY.   Lactic acid,  plasma     Status: None   Collection Time: 10/09/15  8:55 AM  Result Value Ref Range   Lactic Acid, Venous 0.9 0.5 - 2.0 mmol/L  Urinalysis complete, with microscopic     Status: Abnormal   Collection Time: 10/09/15  8:55 AM  Result Value Ref Range   Color, Urine YELLOW (A) YELLOW   APPearance CLEAR (A) CLEAR   Glucose, UA NEGATIVE NEGATIVE mg/dL   Bilirubin Urine NEGATIVE NEGATIVE   Ketones, ur TRACE (A) NEGATIVE mg/dL   Specific Gravity, Urine 1.015 1.005 - 1.030   Hgb urine dipstick NEGATIVE NEGATIVE   pH 7.0 5.0 - 8.0   Protein, ur NEGATIVE NEGATIVE mg/dL   Nitrite NEGATIVE NEGATIVE   Leukocytes, UA NEGATIVE NEGATIVE   RBC / HPF 0-5 0 - 5 RBC/hpf   WBC, UA 0-5 0 - 5 WBC/hpf   Bacteria, UA NONE SEEN NONE SEEN   Squamous Epithelial / LPF 0-5 (A) NONE SEEN   Mucous PRESENT   Lactic acid, plasma     Status: None   Collection Time: 10/09/15  6:50 PM  Result Value Ref Range   Lactic Acid, Venous 0.7 0.5 - 2.0 mmol/L  Ammonia     Status: None   Collection Time: 10/10/15  7:25 AM  Result Value Ref Range   Ammonia 13 9 - 35 umol/L  Urine Drug Screen, Qualitative ([facility] only)     Status: None   Collection Time: 10/10/15 10:54 AM  Result Value Ref Range   Tricyclic, Ur Screen NONE DETECTED NONE DETECTED   Amphetamines, Ur Screen NONE DETECTED NONE DETECTED   MDMA (Ecstasy)Ur Screen NONE DETECTED NONE DETECTED   Cocaine Metabolite,Ur [place] NONE DETECTED NONE DETECTED   Opiate, Ur Screen NONE DETECTED NONE DETECTED   Phencyclidine (PCP) Ur S NONE DETECTED NONE DETECTED   Cannabinoid 50 Ng, Ur [place] NONE DETECTED NONE DETECTED   Barbiturates, Ur Screen NONE DETECTED NONE DETECTED   Benzodiazepine, Ur Scrn NONE DETECTED NONE DETECTED   Methadone Scn, Ur NONE DETECTED NONE DETECTED    Comment: (NOTE) 789  Tricyclics, urine               Cutoff 1000 ng/mL 200  Amphetamines, urine             Cutoff 1000 ng/mL 300  MDMA (Ecstasy), urine           Cutoff 500 ng/mL 400  Cocaine Metabolite,  urine       Cutoff 300 ng/mL 500  Opiate, urine                   Cutoff 300 ng/mL 600  Phencyclidine (PCP), urine      Cutoff 25 ng/mL 700  Cannabinoid, urine              Cutoff 50 ng/mL 800  Barbiturates, urine             Cutoff 200 ng/mL 900  Benzodiazepine, urine           Cutoff 200 ng/mL 1000 Methadone, urine                Cutoff 300 ng/mL 1100 1200 The urine  drug screen provides only a preliminary, unconfirmed 1300 analytical test result and should not be used for non-medical 1400 purposes. Clinical consideration and professional judgment should 1500 be applied to any positive drug screen result due to possible 1600 interfering substances. A more specific alternate chemical method 1700 must be used in order to obtain a confirmed analytical result.  1800 Gas chromato graphy / mass spectrometry (GC/MS) is the preferred 1900 confirmatory method.     Current Facility-Administered Medications  Medication Dose Route Frequency Provider Last Rate Last Dose  . 0.9 %  sodium chloride infusion   Intravenous Continuous Demetrios Loll, MD 75 mL/hr at 10/10/15 [phone]    . acetaminophen (TYLENOL) tablet 1,000 mg  1,000 mg Oral Q6H PRN Demetrios Loll, MD      . albuterol (PROVENTIL) (2.5 MG/3ML) 0.083% nebulizer solution 2.5 mg  2.5 mg Nebulization Q2H PRN Demetrios Loll, MD      . ALPRAZolam Duanne Moron) tablet 0.25 mg  0.25 mg Oral TID PRN Demetrios Loll, MD      . azithromycin [facility]) tablet 500 mg  500 mg Oral Daily Theodoro Grist, MD   500 mg at 10/10/15 1000   Followed by  . [START ON 10/11/2015] azithromycin (ZITHROMAX) tablet 250 mg  250 mg Oral Daily Theodoro Grist, MD      . clopidogrel (PLAVIX) tablet 75 mg  75 mg Oral Daily Demetrios Loll, MD   75 mg at 10/09/15 1754  . diphenoxylate-atropine (LOMOTIL) 2.5-0.025 MG per tablet 2 tablet  2 tablet Oral BID PRN Demetrios Loll, MD      . donepezil (ARICEPT) tablet 10 mg  10 mg Oral QHS Demetrios Loll, MD   10 mg at 10/09/15 2100  . enoxaparin (LOVENOX) injection 40 mg  40  mg Subcutaneous Q24H Theodoro Grist, MD      . fentaNYL (DURAGESIC - dosed mcg/hr) 50 mcg  50 mcg Transdermal Q72H Demetrios Loll, MD   50 mcg at 10/09/15 1855  . ferrous sulfate tablet 325 mg  325 mg Oral Q breakfast Demetrios Loll, MD   325 mg at 10/10/15 0800  . furosemide (LASIX) tablet 40 mg  40 mg Oral Daily Demetrios Loll, MD   40 mg at 10/09/15 1755  . LORazepam (ATIVAN) injection 0.5 mg  0.5 mg Intravenous Q4H Theodoro Grist, MD   0.5 mg at 10/10/15 1500  . mometasone-formoterol (DULERA) 100-5 MCG/ACT inhaler 2 puff  2 puff Inhalation BID Demetrios Loll, MD   2 puff at 10/09/15 2031  . mupirocin ointment (BACTROBAN) 2 % 1 application  1 application Topical BID Demetrios Loll, MD   1 application at 70/35/00 2059  . ondansetron (ZOFRAN) tablet 4 mg  4 mg Oral Q6H PRN Demetrios Loll, MD       Or  . ondansetron [facility]) injection 4 mg  4 mg Intravenous Q6H PRN Demetrios Loll, MD      . pantoprazole (PROTONIX) EC tablet 40 mg  40 mg Oral Daily Demetrios Loll, MD   40 mg at 10/09/15 1756  . simvastatin (ZOCOR) tablet 10 mg  10 mg Oral QHS Demetrios Loll, MD   10 mg at 10/09/15 2058  . SUMAtriptan (IMITREX) tablet 25 mg  25 mg Oral PRN Demetrios Loll, MD      . tiotropium [facility]) inhalation capsule 18 mcg  18 mcg Inhalation Daily Demetrios Loll, MD   18 mcg at 10/09/15 2032    Musculoskeletal: Strength & Muscle Tone: decreased Gait & Station: unable to stand Patient leans: N/A  Psychiatric Specialty Exam: Review of Systems  Unable to perform ROS: mental acuity    Blood pressure 132/41, pulse 69, temperature 97.6 F (36.4 C), temperature source Axillary, resp. rate 18, height 5' (1.524 m), weight 42.185 kg (93 lb), SpO2 100 %.Body mass index is 18.16 kg/(m^2).  General Appearance: Disheveled  Eye Contact::  None  Speech:  Patient did whisper her name, her husband's name and that she was in the hospital but did not speak anything else  Volume:  Decreased  Mood:  Not stated.  Affect:  Flat  Thought Process:  Not able to really assess   Orientation:  Other:  Patient does know that she is in the hospital  Thought Content:  Negative  Suicidal Thoughts:  No  Homicidal Thoughts:  No  Memory:  Negative  Judgement:  Negative  Insight:  Negative  Psychomotor Activity:  Decreased  Concentration:  Negative  Recall:  Negative  Fund of Knowledge:Negative  Language: Negative  Akathisia:  Negative  Handed:  Right  AIMS (if indicated):     Assets:  Financial Resources/Insurance Housing Resilience Social Support  ADL's:  Impaired  Cognition: Impaired,  Moderate and Severe  Sleep:      Treatment Plan Summary: Plan This is a 64 year old woman although she looks a great deal older. He has chronic skills skeletal pain and peripheral neuropathy. She is currently in the hospital with a delirium. Consult concerns the fact that she has had multiple of these episodes without a clear cause being found. Apparently on may be one of these it was thought to possibly be related to an infection but not consistently. Husband states that these kind of symptoms have been going on for 5 or 6 years but admits that there have been 3 of them this year which is much more than usual. Given the lack of other clear workup to explain her state it is hard to avoid the conclusion that it has something to do with her medication. Patient is on multiple sedating medications including her fentanyl patch, Lyrica at full dose, as needed doses of Xanax, fairly substantial numbers of tramadol. The husband makes the point that she has been on this combination of medicine for years and that she was having these spells even before she started on the fentanyl patch. It's very clear that the husband is strongly connected to feeling like she has appropriate medication management. I admit that just from the history we have we can't pin any of this on any specific medicine. I am just guessing but my guess would be that her episodes represent a calming together of multiple factors  including her medications. I don't have any evidence that there is another mental health problem. Because of how closely tied they are to their medicines I am not going to presumed to change anything. I did offer the husband some education about the dangers of combining benzodiazepines and narcotics the way the patient currently does. I encouraged him to continue speaking with her outpatient doctors about these episodes. There does not however appear to be another psychiatric cause right now. I will follow-up only as needed.  Disposition: Patient does not meet criteria for psychiatric inpatient admission. Supportive therapy provided about ongoing stressors.  Cheryl [name] 10/10/2015 5:51 PM

## 2015-10-10 NOTE — Progress Notes (Signed)
[facility] [place] at [facility]   PATIENT NAME: Cheryl [name]    MR#:  [id number]  DATE OF BIRTH:  1951/01/17  SUBJECTIVE:  CHIEF COMPLAINT:   Chief Complaint  Patient presents with  . Altered Mental Status   patient is a 64 year old Caucasian female with past medical history COPD, peripheral vascular disease, coronary artery disease as well as dementia and chronic pain syndrome who presents to the hospital with confusion and not willingness to answer questions. Apparently patient was okay until the morning of the day of admission and then she was found to be confused and was complaining of abdominal as well as chest pain. Workup in the emergency room included comprehensive metabolic panel, urinalysis and CBC which were unremarkable. Urine drug screen was negative as well as ammonia level. Radiologic studies revealed a new atelectasis in upper lobes on chest x-ray,  unremarkable CT scan of the head without contrast and unremarkable. CT scan of abdomen and pelvis except progressive intrahepatic and extrahepatic duct dilatation, moderate stool burden. Patient was given an enema after which she had a bowel movement. Total bilirubin is normal.     Review of Systems  Unable to perform ROS: dementia   intermittently able to shake or nod her head appropriately. Minimal verbal interaction  VITAL SIGNS: Blood pressure 132/41, pulse 69, temperature 97.6 F (36.4 C), temperature source Axillary, resp. rate 18, height 5' (1.524 m), weight 42.185 kg (93 lb), SpO2 100 %.  PHYSICAL EXAMINATION:   GENERAL:  64 y.o.-year-old patient lying in the bed with no acute distress. She takes and also had intermittently appropriately as response to questions, does not open eyes or converse.  EYES: Pupils equal, round, reactive to light and accommodation. No scleral icterus. Extraocular muscles intact.  HEENT: Head atraumatic, normocephalic. Oropharynx and nasopharynx clear.  NECK:   Supple, no jugular venous distention. No thyroid enlargement, no tenderness.  LUNGS: Normal breath sounds bilaterally, no wheezing, rales,rhonchi or crepitation. No use of accessory muscles of respiration.  CARDIOVASCULAR: S1, S2 normal. No murmurs, rubs, or gallops.  ABDOMEN: Soft, nontender, nondistended. Bowel sounds present. No organomegaly or mass.  EXTREMITIES: No pedal edema, cyanosis, or clubbing.  NEUROLOGIC: Cranial nerves II through XII are intact. Muscle strength 5/5 in all extremities. Sensation intact. Gait not checked.  PSYCHIATRIC: The patient is alert and oriented x 3.  SKIN: No obvious rash, lesion, or ulcer.   ORDERS/RESULTS REVIEWED:   CBC  Recent Labs Lab 10/09/15 0855  WBC 9.6  HGB 11.1*  HCT 34.1*  PLT 235  MCV 93.2  MCH 30.3  MCHC 32.5  RDW 14.1  LYMPHSABS 0.6*  MONOABS 0.8  EOSABS 0.0  BASOSABS 0.0   ------------------------------------------------------------------------------------------------------------------  Chemistries   Recent Labs Lab 10/09/15 0855  NA 138  K 3.7  CL 104  CO2 26  GLUCOSE 106*  BUN 12  CREATININE 0.75  CALCIUM 8.8*  AST 17  ALT 9*  ALKPHOS 86  BILITOT 0.3   ------------------------------------------------------------------------------------------------------------------ estimated creatinine clearance is 46.1 mL/min (by C-G formula based on Cr of 0.75). ------------------------------------------------------------------------------------------------------------------ No results for input(s): TSH, T4TOTAL, T3FREE, THYROIDAB in the last 72 hours.  Invalid input(s): FREET3  Cardiac Enzymes  Recent Labs Lab 10/09/15 0855  TROPONINI <0.03   ------------------------------------------------------------------------------------------------------------------ Invalid input(s): POCBNP ---------------------------------------------------------------------------------------------------------------  RADIOLOGY: Ct Head  Wo Contrast  10/09/2015  CLINICAL DATA:  Altered mental status for the past 2 days. History of dementia. EXAM: CT HEAD WITHOUT CONTRAST TECHNIQUE: Contiguous axial images were obtained from  the base of the skull through the vertex without intravenous contrast. COMPARISON:  07/31/2015; 02/03/2015 FINDINGS: Rather extensive periventricular hypodensities are grossly unchanged and compatible microvascular ischemic disease. The gray-white differentiation is otherwise well maintained without CT evidence of acute large territory infarct. No intraparenchymal or extra-axial mass or hemorrhage. Unchanged size and configuration of the ventricles and basilar cisterns. There is persistent underpneumatization of the bilateral frontal sinuses. The remaining paranasal sinuses and mastoid air cells are normally aerated. No air-fluid levels. Regional soft tissues appear normal. No displaced calvarial fracture. IMPRESSION: Similar findings of microvascular ischemic disease without definitive superimposed acute intracranial process. Electronically Signed   By: Simonne ComeJohn  Watts M.D.   On: 10/09/2015 10:01   Ct Abdomen Pelvis W Contrast  10/09/2015  CLINICAL DATA:  Unremarkable. Cough for several days. Abdominal pain. EXAM: CT ABDOMEN AND PELVIS WITH CONTRAST TECHNIQUE: Multidetector CT imaging of the abdomen and pelvis was performed using the standard protocol following bolus administration of intravenous contrast. CONTRAST:  75mL OMNIPAQUE IOHEXOL 300 MG/ML  SOLN COMPARISON:  08/20/2014 FINDINGS: Lower chest: No pleural fluid identified. There is moderate cardiac enlargement. Moderate size hiatal hernia noted. No airspace consolidation identified within the lung bases. Hepatobiliary: No focal liver abnormality. The scratch set there is mild intrahepatic bile duct dilatation. The common bile duct is increased in caliber measuring 9 mm, previously 5 mm. Prior cholecystectomy. Pancreas: Unremarkable appearance of the pancreas. Spleen:  The spleen is negative. Adrenals/Urinary Tract: The adrenal glands are both normal. Normal appearance of both kidneys. Urinary bladder appears within normal limits. Stomach/Bowel: Moderate size hiatal hernia. The stomach is within normal limits. The small bowel loops have a normal course and caliber. No obstruction. Moderate stool burden identified throughout the colon. Vascular/Lymphatic: Calcified atherosclerotic disease involves the abdominal aorta. No aneurysm. No enlarged retroperitoneal or mesenteric adenopathy. No enlarged pelvic or inguinal lymph nodes. Reproductive: Previous hysterectomy.  No adnexal mass noted. Other: No free fluid or fluid collections within the upper abdomen or pelvis. Musculoskeletal: Lumbar scoliosis and degenerative disc disease is present. IMPRESSION: 1. No acute findings identified within the abdomen or pelvis. 2. Progressive intrahepatic and extrahepatic bile duct dilatation status post cholecystectomy. Correlate for any clinical signs or symptoms of biliary obstruction. 3. Aortic atherosclerosis 4. Hiatal hernia 5. Moderate stool burden. Correlate for any clinical signs or symptoms of constipation. 6. Cardiac enlargement. Electronically Signed   By: Signa Kellaylor  Stroud M.D.   On: 10/09/2015 13:46   Dg Chest Port 1 View  10/09/2015  CLINICAL DATA:  Cough.  Altered mental status. EXAM: PORTABLE CHEST 1 VIEW COMPARISON:  07/31/2015 and 03/01/2015 FINDINGS: There are small areas of atelectasis in both upper lobes. There is an area of scarring at the left lung base, probably in the lingula, as well as an area of scarring at the left base posterior medially. No acute osseous abnormality. Calcification in the thoracic aorta. Vascularity is normal. IMPRESSION: Minimal new atelectasis in the upper lobes. Scarring at the left lung base. Electronically Signed   By: Francene BoyersJames  Maxwell M.D.   On: 10/09/2015 09:32    EKG:  Orders placed or performed during the hospital encounter of 10/09/15  .  ED EKG  . ED EKG    ASSESSMENT AND PLAN:  Principal Problem:   Altered mental status 1. Altered mental status of unclear etiology and unlikely physical condition, I'm suspecting psychologic/psychiatric problem. Getting psychiatrist involved for further recommendations. Following clinically 2. Dilated intrahepatic and extrahepatic ducts,  get MRCP 3. Right breast mass. Get oncologist  involved 4. Anemia, get guaiac   Management plans discussed with the patient, family and they are in agreement.   DRUG ALLERGIES:  Allergies  Allergen Reactions  . Percocet [Oxycodone-Acetaminophen] Hives  . Aspirin Nausea Only and Other (See Comments)    Reaction:  GI upset   . Codeine Nausea Only and Other (See Comments)    Reaction:  GI upset   . Morphine Other (See Comments)    Reaction:  Unknown   . Pregabalin Other (See Comments)    Reaction:  Makes pt sleep excessively.    . Propoxyphene Other (See Comments)    Reaction:  GI upset   . Sulfa Antibiotics Rash and Other (See Comments)    Reaction:  GI upset     CODE STATUS:     Code Status Orders        Start     Ordered   10/09/15 1739  Full code   Continuous     10/09/15 1738      TOTAL TIME TAKING CARE OF THIS PATIENT: 40 minutes.    Katharina Caper M.D on 10/10/2015 at 2:39 PM  Between 7am to 6pm - Pager - [phone]  After 6pm go to [email or web address] - password [email or web address] [facility]  [facility]  Office  [phone]  CC: Primary care physician; Mila Merry, MD

## 2015-10-11 ENCOUNTER — Observation Stay: Payer: Commercial Managed Care - HMO

## 2015-10-11 ENCOUNTER — Ambulatory Visit: Payer: Commercial Managed Care - HMO

## 2015-10-11 DIAGNOSIS — G8929 Other chronic pain: Secondary | ICD-10-CM

## 2015-10-11 DIAGNOSIS — J209 Acute bronchitis, unspecified: Secondary | ICD-10-CM

## 2015-10-11 DIAGNOSIS — G629 Polyneuropathy, unspecified: Secondary | ICD-10-CM | POA: Diagnosis not present

## 2015-10-11 DIAGNOSIS — F17210 Nicotine dependence, cigarettes, uncomplicated: Secondary | ICD-10-CM

## 2015-10-11 DIAGNOSIS — M549 Dorsalgia, unspecified: Secondary | ICD-10-CM | POA: Diagnosis not present

## 2015-10-11 DIAGNOSIS — Z8701 Personal history of pneumonia (recurrent): Secondary | ICD-10-CM

## 2015-10-11 DIAGNOSIS — I510 Cardiac septal defect, acquired: Secondary | ICD-10-CM

## 2015-10-11 DIAGNOSIS — R52 Pain, unspecified: Secondary | ICD-10-CM | POA: Diagnosis not present

## 2015-10-11 DIAGNOSIS — R0902 Hypoxemia: Secondary | ICD-10-CM

## 2015-10-11 DIAGNOSIS — R928 Other abnormal and inconclusive findings on diagnostic imaging of breast: Secondary | ICD-10-CM | POA: Diagnosis present

## 2015-10-11 DIAGNOSIS — R109 Unspecified abdominal pain: Secondary | ICD-10-CM

## 2015-10-11 DIAGNOSIS — M129 Arthropathy, unspecified: Secondary | ICD-10-CM

## 2015-10-11 DIAGNOSIS — K219 Gastro-esophageal reflux disease without esophagitis: Secondary | ICD-10-CM

## 2015-10-11 DIAGNOSIS — E785 Hyperlipidemia, unspecified: Secondary | ICD-10-CM

## 2015-10-11 DIAGNOSIS — J449 Chronic obstructive pulmonary disease, unspecified: Secondary | ICD-10-CM | POA: Diagnosis not present

## 2015-10-11 DIAGNOSIS — I10 Essential (primary) hypertension: Secondary | ICD-10-CM | POA: Diagnosis not present

## 2015-10-11 DIAGNOSIS — J45909 Unspecified asthma, uncomplicated: Secondary | ICD-10-CM

## 2015-10-11 DIAGNOSIS — I739 Peripheral vascular disease, unspecified: Secondary | ICD-10-CM

## 2015-10-11 DIAGNOSIS — R4182 Altered mental status, unspecified: Secondary | ICD-10-CM

## 2015-10-11 DIAGNOSIS — M818 Other osteoporosis without current pathological fracture: Secondary | ICD-10-CM

## 2015-10-11 DIAGNOSIS — K838 Other specified diseases of biliary tract: Secondary | ICD-10-CM

## 2015-10-11 DIAGNOSIS — R7989 Other specified abnormal findings of blood chemistry: Secondary | ICD-10-CM | POA: Diagnosis not present

## 2015-10-11 DIAGNOSIS — R410 Disorientation, unspecified: Secondary | ICD-10-CM | POA: Diagnosis not present

## 2015-10-11 DIAGNOSIS — Z801 Family history of malignant neoplasm of trachea, bronchus and lung: Secondary | ICD-10-CM

## 2015-10-11 DIAGNOSIS — F419 Anxiety disorder, unspecified: Secondary | ICD-10-CM

## 2015-10-11 DIAGNOSIS — D649 Anemia, unspecified: Secondary | ICD-10-CM | POA: Diagnosis not present

## 2015-10-11 DIAGNOSIS — N63 Unspecified lump in breast: Secondary | ICD-10-CM

## 2015-10-11 DIAGNOSIS — Z8669 Personal history of other diseases of the nervous system and sense organs: Secondary | ICD-10-CM

## 2015-10-11 DIAGNOSIS — R51 Headache: Secondary | ICD-10-CM

## 2015-10-11 DIAGNOSIS — Z86718 Personal history of other venous thrombosis and embolism: Secondary | ICD-10-CM

## 2015-10-11 DIAGNOSIS — E559 Vitamin D deficiency, unspecified: Secondary | ICD-10-CM

## 2015-10-11 MED ORDER — ALPRAZOLAM 0.25 MG PO TABS: 0.1250 mg | ORAL_TABLET | Freq: Three times a day (TID) | ORAL | Status: DC | PRN

## 2015-10-11 MED ORDER — ALBUTEROL SULFATE (2.5 MG/3ML) 0.083% IN NEBU: 2.5000 mg | INHALATION_SOLUTION | RESPIRATORY_TRACT | Status: DC | PRN

## 2015-10-11 MED ORDER — GADOBENATE DIMEGLUMINE 529 MG/ML IV SOLN
10.0000 mL | Freq: Once | INTRAVENOUS | Status: AC | PRN
  Administered 2015-10-11: 8 mL via INTRAVENOUS

## 2015-10-11 MED ORDER — FENTANYL 12 MCG/HR TD PT72: 25.0000 ug | MEDICATED_PATCH | TRANSDERMAL | Status: DC

## 2015-10-11 MED ORDER — AZITHROMYCIN 250 MG PO TABS: 250.0000 mg | ORAL_TABLET | Freq: Every day | ORAL | Status: DC

## 2015-10-11 NOTE — Consult Note (Signed)
[facility] @ [facility] [phone]  [phone]     Cheryl [name] OB: February 18, 1951  MR#: [id number]  DRO#:[id number]  Patient Care Team: Malva Limes, MD as PCP - General (Family Medicine) Wallace Cullens, MD (Gastroenterology) Delano Metz, MD (Pain Medicine)  Requesting Provider: Katharina Caper, MD  Reason for Consultation: R breast mass  CHIEF COMPLAINT:  Chief Complaint  Patient presents with  . Altered Mental Status     No history exists.    Oncology Flowsheet 08/01/2015 08/02/2015 10/11/2015  ALPRAZolam (XANAX) PO - - 0.25 mg  enoxaparin (LOVENOX) [place] 40 mg 40 mg -    HISTORY OF PRESENT ILLNESS:   Patient is admitted to our hospital with altered mental status. The reason for consultation is a questionable right breast mass, which was detected on a screening mammogram done less than a week ago. Patient's husband claims that the patient has had regular mammograms, which showed no abnormalities, however the results are not available for the immediate review. Patient is nonverbal and does not participate in the discussion. Patient's husband does not recall any complaints from the patient regarding breast masses, nodules, pain, nipple discharge.   REVIEW OF SYSTEMS:   Review of Systems  Unable to perform ROS: patient nonverbal     PAST MEDICAL HISTORY: Past Medical History  Diagnosis Date  . COPD (chronic obstructive pulmonary disease) (HCC)   . Hypertension   . Arthritis   . Hyperlipidemia   . GERD (gastroesophageal reflux disease)   . Neuropathy (HCC) 2010  . Oxygen deficiency   . Coronary artery disease   . Headache   . Anxiety   . Peripheral vascular disease (HCC)   . Vitamin D deficiency   . Migraines   . Chronic pain   . DVT (deep venous thrombosis) (HCC)   . Pneumonia   . Osteoporosis   . Allergy   . Asthma     PAST SURGICAL HISTORY: Past Surgical History  Procedure Laterality Date  . Appendectomy    . Spine surgery    .  Foot surgery Bilateral     5-6 years per patient  . Cardiac catherization  10/31/2009  . Abdomnal aortic stent  05/30/2008    Dr. Nanetta Batty  . Abdominal hysterectomy  1975    Bilaterl Oophorectomy; Dur to IUD infection  . Cholecystectomy  1972  . Cervical fusion  C5 - 6/C6-7  . Appendectomy    . Colonoscopy with propofol N/A 07/27/2015    Procedure: COLONOSCOPY WITH PROPOFOL;  Surgeon: Wallace Cullens, MD;  Location: [facility] ENDOSCOPY;  Service: Gastroenterology;  Laterality: N/A;  . Esophagogastroduodenoscopy (egd) with propofol N/A 07/27/2015    Procedure: ESOPHAGOGASTRODUODENOSCOPY (EGD) WITH PROPOFOL;  Surgeon: Wallace Cullens, MD;  Location: [facility] ENDOSCOPY;  Service: Gastroenterology;  Laterality: N/A;    FAMILY HISTORY Family History  Problem Relation Age of Onset  . Cancer Mother   . Arthritis Mother   . Heart disease Mother   . Diabetes Mother     mellitus, type 2  . Heart disease Father   . Diabetes Sister   . Cancer Brother   . Cancer Brother     lung  . Diabetes Brother     ADVANCED DIRECTIVES:  No flowsheet data found.  HEALTH MAINTENANCE: Social History  Substance Use Topics  . Smoking status: Current Every Day Smoker -- 1.00 packs/day for 50 years    Types: Cigarettes  . Smokeless tobacco: None  . Alcohol Use: No  Allergies  Allergen Reactions  . Percocet [Oxycodone-Acetaminophen] Hives  . Aspirin Nausea Only and Other (See Comments)    Reaction:  GI upset   . Codeine Nausea Only and Other (See Comments)    Reaction:  GI upset   . Morphine Other (See Comments)    Reaction:  Unknown   . Pregabalin Other (See Comments)    Reaction:  Makes pt sleep excessively.    . Propoxyphene Other (See Comments)    Reaction:  GI upset   . Sulfa Antibiotics Rash and Other (See Comments)    Reaction:  GI upset     Current Facility-Administered Medications  Medication Dose Route Frequency Provider Last Rate Last Dose  . 0.9 %  sodium chloride infusion   Intravenous  Continuous Demetrios Loll, MD 75 mL/hr at 10/10/15 [phone]    . acetaminophen (TYLENOL) tablet 1,000 mg  1,000 mg Oral Q6H PRN Demetrios Loll, MD      . albuterol (PROVENTIL) (2.5 MG/3ML) 0.083% nebulizer solution 2.5 mg  2.5 mg Nebulization Q2H PRN Demetrios Loll, MD      . ALPRAZolam Duanne Moron) tablet 0.25 mg  0.25 mg Oral TID PRN Demetrios Loll, MD   0.25 mg at 10/11/15 1044  . azithromycin (ZITHROMAX) tablet 250 mg  250 mg Oral Daily Theodoro Grist, MD      . clopidogrel (PLAVIX) tablet 75 mg  75 mg Oral Daily Demetrios Loll, MD   75 mg at 10/09/15 1754  . diphenoxylate-atropine (LOMOTIL) 2.5-0.025 MG per tablet 2 tablet  2 tablet Oral BID PRN Demetrios Loll, MD      . donepezil (ARICEPT) tablet 10 mg  10 mg Oral QHS Demetrios Loll, MD   10 mg at 10/09/15 2100  . enoxaparin (LOVENOX) injection 40 mg  40 mg Subcutaneous Q24H Theodoro Grist, MD   40 mg at 10/10/15 2200  . fentaNYL (DURAGESIC - dosed mcg/hr) 50 mcg  50 mcg Transdermal Q72H Demetrios Loll, MD   50 mcg at 10/09/15 1855  . ferrous sulfate tablet 325 mg  325 mg Oral Q breakfast Demetrios Loll, MD   325 mg at 10/10/15 0800  . furosemide (LASIX) tablet 40 mg  40 mg Oral Daily Demetrios Loll, MD   40 mg at 10/09/15 1755  . LORazepam (ATIVAN) injection 0.5 mg  0.5 mg Intravenous Q4H Theodoro Grist, MD   0.5 mg at 10/10/15 1500  . mometasone-formoterol (DULERA) 100-5 MCG/ACT inhaler 2 puff  2 puff Inhalation BID Demetrios Loll, MD   2 puff at 10/09/15 2031  . mupirocin ointment (BACTROBAN) 2 % 1 application  1 application Topical BID Demetrios Loll, MD   1 application at 53/61/44 2059  . ondansetron (ZOFRAN) tablet 4 mg  4 mg Oral Q6H PRN Demetrios Loll, MD       Or  . ondansetron [facility]) injection 4 mg  4 mg Intravenous Q6H PRN Demetrios Loll, MD      . pantoprazole (PROTONIX) EC tablet 40 mg  40 mg Oral Daily Demetrios Loll, MD   40 mg at 10/09/15 1756  . simvastatin (ZOCOR) tablet 10 mg  10 mg Oral QHS Demetrios Loll, MD   10 mg at 10/09/15 2058  . SUMAtriptan (IMITREX) tablet 25 mg  25 mg Oral PRN Demetrios Loll, MD      .  tiotropium [facility]) inhalation capsule 18 mcg  18 mcg Inhalation Daily Demetrios Loll, MD   18 mcg at 10/09/15 2032    OBJECTIVE:  Filed Vitals:   10/11/15 [id number]  BP: 156/57  Pulse: 77  Temp: 98.4 F (36.9 C)  Resp: 18     Body mass index is 18.16 kg/(m^2).    ECOG FS:4 - Bedbound  Physical Exam  Constitutional: No distress.  HENT:  Head: Normocephalic and atraumatic.  Right Ear: External ear normal.  Left Ear: External ear normal.  Mouth/Throat: Oropharynx is clear and moist.  Eyes: Conjunctivae are normal. Pupils are equal, round, and reactive to light. Right eye exhibits no discharge. Left eye exhibits no discharge. No scleral icterus.  Tightly shut, resists opening  Neck: Normal range of motion. Neck supple. No JVD present. No tracheal deviation present. No thyromegaly present.  Cardiovascular: Normal rate, regular rhythm, normal heart sounds and intact distal pulses.  Exam reveals no gallop and no friction rub.   No murmur heard. Pulmonary/Chest: Effort normal and breath sounds normal. No stridor. No respiratory distress. She has no wheezes. She has no rales. She exhibits no tenderness. Right breast exhibits no inverted nipple, no mass, no nipple discharge, no skin change and no tenderness. Left breast exhibits no inverted nipple, no mass, no nipple discharge, no skin change and no tenderness. Breasts are symmetrical.  Small breasts b/l, amenable to palpation. No palpable mass in either breasts or axillary areas  Abdominal: Soft. Bowel sounds are normal. She exhibits no distension and no mass. There is no tenderness. There is no rebound and no guarding.  Genitourinary:  Postponed  Musculoskeletal: Normal range of motion. She exhibits no edema or tenderness.  Lymphadenopathy:    She has no cervical adenopathy.  Neurological: No cranial nerve deficit. She exhibits normal muscle tone. Coordination normal.  Does not participate in the physical exam, but moves extremities freely  Skin:  Skin is warm and dry. No rash noted. She is not diaphoretic. No erythema. There is pallor.  Psychiatric:  Patient nonverbal, does not participate in conversation or physical examination, but moves extremities freely, and does not resist examination     LAB RESULTS:  CBC Latest Ref Rng 10/09/2015 08/01/2015  WBC 3.6 - 11.0 K/uL 9.6 4.6  Hemoglobin 12.0 - 16.0 g/dL 11.1(L) 10.8(L)  Hematocrit 35.0 - 47.0 % 34.1(L) 33.1(L)  Platelets 150 - 440 K/uL 235 223    Admission on 10/09/2015  Component Date Value Ref Range Status  . WBC 10/09/2015 9.6  3.6 - 11.0 K/uL Final  . RBC 10/09/2015 3.65* 3.80 - 5.20 MIL/uL Final  . Hemoglobin 10/09/2015 11.1* 12.0 - 16.0 g/dL Final  . HCT 10/09/2015 34.1* 35.0 - 47.0 % Final  . MCV 10/09/2015 93.2  80.0 - 100.0 fL Final  . MCH 10/09/2015 30.3  26.0 - 34.0 pg Final  . MCHC 10/09/2015 32.5  32.0 - 36.0 g/dL Final  . RDW 10/09/2015 14.1  11.5 - 14.5 % Final  . Platelets 10/09/2015 235  150 - 440 K/uL Final  . Neutrophils Relative % 10/09/2015 85   Final  . Neutro Abs 10/09/2015 8.1* 1.4 - 6.5 K/uL Final  . Lymphocytes Relative 10/09/2015 6   Final  . Lymphs Abs 10/09/2015 0.6* 1.0 - 3.6 K/uL Final  . Monocytes Relative 10/09/2015 9   Final  . Monocytes Absolute 10/09/2015 0.8  0.2 - 0.9 K/uL Final  . Eosinophils Relative 10/09/2015 0   Final  . Eosinophils Absolute 10/09/2015 0.0  0 - 0.7 K/uL Final  . Basophils Relative 10/09/2015 0   Final  . Basophils Absolute 10/09/2015 0.0  0 - 0.1 K/uL Final  . Sodium 10/09/2015 138  135 -  145 mmol/L Final  . Potassium 10/09/2015 3.7  3.5 - 5.1 mmol/L Final  . Chloride 10/09/2015 104  101 - 111 mmol/L Final  . CO2 10/09/2015 26  22 - 32 mmol/L Final  . Glucose, Bld 10/09/2015 106* 65 - 99 mg/dL Final  . BUN 10/09/2015 12  6 - 20 mg/dL Final  . Creatinine, Ser 10/09/2015 0.75  0.44 - 1.00 mg/dL Final  . Calcium 10/09/2015 8.8* 8.9 - 10.3 mg/dL Final  . Total Protein 10/09/2015 7.3  6.5 - 8.1 g/dL Final  .  Albumin 10/09/2015 3.6  3.5 - 5.0 g/dL Final  . AST 10/09/2015 17  15 - 41 U/L Final  . ALT 10/09/2015 9* 14 - 54 U/L Final  . Alkaline Phosphatase 10/09/2015 86  38 - 126 U/L Final  . Total Bilirubin 10/09/2015 0.3  0.3 - 1.2 mg/dL Final  . GFR calc non Af Amer 10/09/2015 >60  >60 mL/min Final  . GFR calc Af Amer 10/09/2015 >60  >60 mL/min Final   Comment: (NOTE) The eGFR has been calculated using the CKD EPI equation. This calculation has not been validated in all clinical situations. eGFR's persistently <60 mL/min signify possible Chronic Kidney Disease.   . Anion gap 10/09/2015 8  5 - 15 Final  . Lipase 10/09/2015 20  11 - 51 U/L Final  . Troponin I 10/09/2015 <0.03  <0.031 ng/mL Final   Comment:        NO INDICATION OF MYOCARDIAL INJURY.   . Lactic Acid, Venous 10/09/2015 0.9  0.5 - 2.0 mmol/L Final  . Lactic Acid, Venous 10/09/2015 0.7  0.5 - 2.0 mmol/L Final  . Color, Urine 10/09/2015 YELLOW* YELLOW Final  . APPearance 10/09/2015 CLEAR* CLEAR Final  . Glucose, UA 10/09/2015 NEGATIVE  NEGATIVE mg/dL Final  . Bilirubin Urine 10/09/2015 NEGATIVE  NEGATIVE Final  . Ketones, ur 10/09/2015 TRACE* NEGATIVE mg/dL Final  . Specific Gravity, Urine 10/09/2015 1.015  1.005 - 1.030 Final  . Hgb urine dipstick 10/09/2015 NEGATIVE  NEGATIVE Final  . pH 10/09/2015 7.0  5.0 - 8.0 Final  . Protein, ur 10/09/2015 NEGATIVE  NEGATIVE mg/dL Final  . Nitrite 10/09/2015 NEGATIVE  NEGATIVE Final  . Leukocytes, UA 10/09/2015 NEGATIVE  NEGATIVE Final  . RBC / HPF 10/09/2015 0-5  0 - 5 RBC/hpf Final  . WBC, UA 10/09/2015 0-5  0 - 5 WBC/hpf Final  . Bacteria, UA 10/09/2015 NONE SEEN  NONE SEEN Final  . Squamous Epithelial / LPF 10/09/2015 0-5* NONE SEEN Final  . Mucous 10/09/2015 PRESENT   Final  . Ammonia 10/10/2015 13  9 - 35 umol/L Final  . Tricyclic, Ur Screen 60/63/0160 NONE DETECTED  NONE DETECTED Final  . Amphetamines, Ur Screen 10/10/2015 NONE DETECTED  NONE DETECTED Final  . MDMA  (Ecstasy)Ur Screen 10/10/2015 NONE DETECTED  NONE DETECTED Final  . Cocaine Metabolite,Ur [place] 10/10/2015 NONE DETECTED  NONE DETECTED Final  . Opiate, Ur Screen 10/10/2015 NONE DETECTED  NONE DETECTED Final  . Phencyclidine (PCP) Ur S 10/10/2015 NONE DETECTED  NONE DETECTED Final  . Cannabinoid 50 Ng, Ur [place] 10/10/2015 NONE DETECTED  NONE DETECTED Final  . Barbiturates, Ur Screen 10/10/2015 NONE DETECTED  NONE DETECTED Final  . Benzodiazepine, Ur Scrn 10/10/2015 NONE DETECTED  NONE DETECTED Final  . Methadone Scn, Ur 10/10/2015 NONE DETECTED  NONE DETECTED Final   Comment: (NOTE) 109  Tricyclics, urine               Cutoff 1000 ng/mL 200  Amphetamines, urine             Cutoff 1000 ng/mL 300  MDMA (Ecstasy), urine           Cutoff 500 ng/mL 400  Cocaine Metabolite, urine       Cutoff 300 ng/mL 500  Opiate, urine                   Cutoff 300 ng/mL 600  Phencyclidine (PCP), urine      Cutoff 25 ng/mL 700  Cannabinoid, urine              Cutoff 50 ng/mL 800  Barbiturates, urine             Cutoff 200 ng/mL 900  Benzodiazepine, urine           Cutoff 200 ng/mL 1000 Methadone, urine                Cutoff 300 ng/mL 1100 1200 The urine drug screen provides only a preliminary, unconfirmed 1300 analytical test result and should not be used for non-medical 1400 purposes. Clinical consideration and professional judgment should 1500 be applied to any positive drug screen result due to possible 1600 interfering substances. A more specific alternate chemical method 1700 must be used in order to obtain a confirmed analytical result.  1800 Gas chromato                          graphy / mass spectrometry (GC/MS) is the preferred 1900 confirmatory method.      STUDIES: Ct Head Wo Contrast  10/09/2015  CLINICAL DATA:  Altered mental status for the past 2 days. History of dementia. EXAM: CT HEAD WITHOUT CONTRAST TECHNIQUE: Contiguous axial images were obtained from the base of the skull through the  vertex without intravenous contrast. COMPARISON:  07/31/2015; 02/03/2015 FINDINGS: Rather extensive periventricular hypodensities are grossly unchanged and compatible microvascular ischemic disease. The gray-white differentiation is otherwise well maintained without CT evidence of acute large territory infarct. No intraparenchymal or extra-axial mass or hemorrhage. Unchanged size and configuration of the ventricles and basilar cisterns. There is persistent underpneumatization of the bilateral frontal sinuses. The remaining paranasal sinuses and mastoid air cells are normally aerated. No air-fluid levels. Regional soft tissues appear normal. No displaced calvarial fracture. IMPRESSION: Similar findings of microvascular ischemic disease without definitive superimposed acute intracranial process. Electronically Signed   By: Sandi Mariscal M.D.   On: 10/09/2015 10:01   Ct Abdomen Pelvis W Contrast  10/09/2015  CLINICAL DATA:  Unremarkable. Cough for several days. Abdominal pain. EXAM: CT ABDOMEN AND PELVIS WITH CONTRAST TECHNIQUE: Multidetector CT imaging of the abdomen and pelvis was performed using the standard protocol following bolus administration of intravenous contrast. CONTRAST:  64mL OMNIPAQUE IOHEXOL 300 MG/ML  SOLN COMPARISON:  08/20/2014 FINDINGS: Lower chest: No pleural fluid identified. There is moderate cardiac enlargement. Moderate size hiatal hernia noted. No airspace consolidation identified within the lung bases. Hepatobiliary: No focal liver abnormality. The scratch set there is mild intrahepatic bile duct dilatation. The common bile duct is increased in caliber measuring 9 mm, previously 5 mm. Prior cholecystectomy. Pancreas: Unremarkable appearance of the pancreas. Spleen: The spleen is negative. Adrenals/Urinary Tract: The adrenal glands are both normal. Normal appearance of both kidneys. Urinary bladder appears within normal limits. Stomach/Bowel: Moderate size hiatal hernia. The stomach is  within normal limits. The small bowel loops have a normal course and caliber. No obstruction. Moderate stool burden identified  throughout the colon. Vascular/Lymphatic: Calcified atherosclerotic disease involves the abdominal aorta. No aneurysm. No enlarged retroperitoneal or mesenteric adenopathy. No enlarged pelvic or inguinal lymph nodes. Reproductive: Previous hysterectomy.  No adnexal mass noted. Other: No free fluid or fluid collections within the upper abdomen or pelvis. Musculoskeletal: Lumbar scoliosis and degenerative disc disease is present. IMPRESSION: 1. No acute findings identified within the abdomen or pelvis. 2. Progressive intrahepatic and extrahepatic bile duct dilatation status post cholecystectomy. Correlate for any clinical signs or symptoms of biliary obstruction. 3. Aortic atherosclerosis 4. Hiatal hernia 5. Moderate stool burden. Correlate for any clinical signs or symptoms of constipation. 6. Cardiac enlargement. Electronically Signed   By: Kerby Moors M.D.   On: 10/09/2015 13:46   Dg Chest Port 1 View  10/09/2015  CLINICAL DATA:  Cough.  Altered mental status. EXAM: PORTABLE CHEST 1 VIEW COMPARISON:  07/31/2015 and 03/01/2015 FINDINGS: There are small areas of atelectasis in both upper lobes. There is an area of scarring at the left lung base, probably in the lingula, as well as an area of scarring at the left base posterior medially. No acute osseous abnormality. Calcification in the thoracic aorta. Vascularity is normal. IMPRESSION: Minimal new atelectasis in the upper lobes. Scarring at the left lung base. Electronically Signed   By: Lorriane Shire M.D.   On: 10/09/2015 09:32   Mm Screening Breast Tomo Bilateral  10/06/2015  CLINICAL DATA:  Screening. EXAM: DIGITAL SCREENING BILATERAL MAMMOGRAM WITH 3D TOMO WITH CAD COMPARISON:  Previous exam(s). ACR Breast Density Category b: There are scattered areas of fibroglandular density. FINDINGS: In the right breast, a possible mass  warrants further evaluation. This possible small mass is seen within the central outer right breast, at middle depth, on the CC projection only, best seen on tomosynthesis CC slice 18. In the left breast, no findings suspicious for malignancy. Images were processed with CAD. IMPRESSION: Further evaluation is suggested for possible mass in the right breast. At the time of the diagnostic imaging, recommend physical exam correlation for apparent right nipple retraction (scar?). RECOMMENDATION: Diagnostic mammogram and possibly ultrasound of the right breast. (Code:[id number]-[id number]) The patient will be contacted regarding the findings, and additional imaging will be scheduled. BI-RADS CATEGORY  0: Incomplete. Need additional imaging evaluation and/or prior mammograms for comparison. Electronically Signed   By: Franki Cabot M.D.   On: 10/06/2015 17:13    ASSESSMENT: 1. Questionable right breast mass-physical exam does not suggest the presence of a large palpable mass, as such it seems reasonable to follow radiology recommendation to repeat mammogram and request ultrasound of the right breast. If the mass is confirmed by imaging studies,the patient should be referred to our surgery colleagues for a biopsy of the mass. The exact modality of the biopsy should be determined by the surgeons. At that time, if the diagnosis of breast cancer, invasive or noninvasive is made, patient can be referred back to medical oncology.I also personally reviewed mammogram images.  Patient is unable to participate in decision making process, but her husband expressed understanding and was in agreement with this plan.  No matching staging information was found for the patient.  Roxana Hires, MD   10/11/2015 10:46 AM

## 2015-10-11 NOTE — Evaluation (Signed)
Physical Therapy Evaluation Patient Details Name: Cheryl MargaritaMary George [name] MRN: [id number] DOB: 1951-06-13 Today's Date: 10/11/2015   History of Present Illness  Cheryl DimmerMary Pflug is a 64 y.o. female with a known history of hypertension, COPD, PAD, CAD, dementia and chronic pain syndrome. The patient was sent to ED due to confusion today. The patient WAS confused and does not answer questions in the ED. According to the patient's husband, the patient was fine until the morning of arrival. She was found confused and complaining of chest and abdominal pain. The patient had multiple episodes similar problem before without causes found, after hospitalization for several days each time. Workup including CAT scan of the head, chest x-ray, lab and urinalysis in the ED were negative. Since the patient wasn't able to be sent home in this condition, Dr. Shaune PollackLord, ED physician, requested hospitalization for management until the patient is back to her baseline. The patient is taking many pain medications and prior admissions have attributed patient's symptoms to excessive pain medication usage. Husband reports multiple similar episodes which resolve after multiple days. Pt is known to this therapist from prior admission. Patient's baseline function is very poor and husband reports repeated falls at home, as many as 6 falls/wk. Pt uses rolling walker at home. She stays at the house alone during the daytime and husband calls pt every 1.5 hours to make sure she is alright. Patient's brother lives nearby and can check on the patient as needed but cannot stay with the patient. Patient does not have 24/7 assistance. Upon arrival pt is AOx1 with very minimal response to questions. She is extremely lethargic and does not open eyes throughout questioning. History provided by husband and daughter. Family very concerned about patient's possible discharge today.  Clinical Impression  Pt is extremely lethargic and somnolent during PT evaluation. She  expresses painful moans and grimaces during bed mobility and transfers. Pt appears to complain of LE pain but difficult to determine due to poor cognition. Pt requires maxA+1 for bed mobility, transfers, and attempted ambulation. She demonstrates severe lack of safety awareness and falls forward and then backwards with attempted ambulation. Pt collapses into therapist's arms and he has to perform carry patient back to bed. Pt is extremely unsafe at this time and is a very high risk for falls if she attempts any manner of ambulation without dependent assistance. She is not a SNF candidate as she cannot currently participate with therapist however she is very unsafe to be at home. Overall pt has very poor baseline mobility however this is a severe deviation from her normal function. Pt has had similar episodes in the past requiring admission which resolve after multiple days without any determined etiology other than presumed pain medication overuse. If pt were to discharge home at this time she would require use of wheelchair to enter house and someone to lift her into bed. Pt would have to be on bed rest until cognition cleared due to high risk for falls, injury, and readmission. Discussed concerns with family as well as [employer] fund[employer]. Pt will benefit from skilled PT services to address deficits in strength, balance, and mobility in order to return to full function at home.      Follow Up Recommendations Home health PT;Supervision/Assistance - 24 hour    Equipment Recommendations  None recommended by PT    Recommendations for Other Services       Precautions / Restrictions Precautions Precautions: Fall Restrictions Weight Bearing Restrictions: No      Mobility  Bed Mobility Overal bed mobility: Needs Assistance Bed Mobility: Supine to Sit     Supine to sit: Max assist     General bed mobility comments: Pt with poor command follow. Does not want to sit up but does not actively resist therapist. Poor  participation provided by patient. Patient is extremely lethargic and moans during bed mobility  Transfers Overall transfer level: Needs assistance Equipment used: Rolling walker (2 wheeled) Transfers: Sit to/from Stand Sit to Stand: Max assist         General transfer comment: Pt with very poor safety awareness during transfer. Requires maxA to remain upright at EOB due to falling to the R to lay back down. She requires maxA+1 to come to standing upright at EOB. Pt falling over requiring maxA to remain upright. Attempted stepping with patient but she requires assist to advance walker and she eventually pushes walker out in front of her and falls forward. Therapist corrects and then patient falls backward. Therapist has to perform dependent carry of patient back to bed. Pt does not react to loss fo balance and has no awareness of lack of safety. Poor insight, judgement, and arousal.  Ambulation/Gait             General Gait Details: Unable to perform at this time  Stairs            Wheelchair Mobility    Modified Rankin (Stroke Patients Only)       Balance Overall balance assessment: Needs assistance Sitting-balance support: Bilateral upper extremity supported Sitting balance-Leahy Scale: Poor       Standing balance-Leahy Scale: Poor                               Pertinent Vitals/Pain Pain Assessment: Faces Faces Pain Scale: Hurts whole lot Pain Location: Pt appears to indicate bilateral LEs are painful but difficult to assess due to cognition Pain Intervention(s): Repositioned;Monitored during session;Limited activity within patient's tolerance;Other (comment) (RN notified)    Home Living Family/patient expects to be discharged to:: Private residence Living Arrangements: Spouse/significant other Available Help at Discharge: Family;Available PRN/intermittently Type of Home: House Home Access: Ramped entrance     Home Layout: One level Home  Equipment: Walker - 2 wheels;Walker - 4 wheels;Cane - single point;Shower seat;Wheelchair - manual (no BSC)      Prior Function Level of Independence: Needs assistance   Gait / Transfers Assistance Needed: Independent with rolling walker  ADL's / Homemaking Assistance Needed: Assist for ADLs/IADLs. Pt is able to perform part of her bathing independently        Hand Dominance        Extremity/Trunk Assessment   Upper Extremity Assessment: Difficult to assess due to impaired cognition           Lower Extremity Assessment: Difficult to assess due to impaired cognition (Able to support bodyweight in standing)         Communication   Communication: Other (comment) (Slurred and lethargic)  Cognition Arousal/Alertness: Lethargic Behavior During Therapy: Restless Overall Cognitive Status: Impaired/Different from baseline Area of Impairment: Orientation Orientation Level: Disoriented to;Place;Time;Situation (Able to provide name, will not provide DOB)             General Comments: Pt with minimal interaction with therapist. Limited responses to questions include name. Pt is resistant to ambulation with therapist.    General Comments      Exercises  Assessment/Plan    PT Assessment Patient needs continued PT services  PT Diagnosis Difficulty walking;Abnormality of gait;Generalized weakness;Acute pain   PT Problem List Decreased strength;Decreased activity tolerance;Decreased balance;Decreased mobility;Decreased coordination;Decreased cognition;Decreased knowledge of use of DME;Decreased safety awareness;Pain  PT Treatment Interventions DME instruction;Gait training;Functional mobility training;Therapeutic exercise;Therapeutic activities;Balance training;Neuromuscular re-education;Cognitive remediation;Patient/family education;Wheelchair mobility training   PT Goals (Current goals can be found in the Care Plan section) Acute Rehab PT Goals PT Goal Formulation:  Patient unable to participate in goal setting    Frequency Min 2X/week   Barriers to discharge Decreased caregiver support Pt does not have 24/7 support    Co-evaluation               End of Session Equipment Utilized During Treatment: Gait belt Activity Tolerance: Patient limited by lethargy;Patient limited by fatigue Patient left: in bed;with call bell/phone within reach;with bed alarm set;with family/visitor present Nurse Communication: Mobility status (cognitive status, moaning, safety concerns)    Functional Assessment Tool Used: clinical judgement Functional Limitation: Mobility: Walking and moving around Mobility: Walking and Moving Around Current Status ([id number]): At least 80 percent but less than 100 percent impaired, limited or restricted Mobility: Walking and Moving Around Goal Status [phone]): At least 40 percent but less than 60 percent impaired, limited or restricted    Time: 1610-1630 PT Time Calculation (min) (ACUTE ONLY): 20 min   Charges:   PT Evaluation $Initial PT Evaluation Tier I: 1 Procedure     PT G Codes:   PT G-Codes **NOT FOR INPATIENT CLASS** Functional Assessment Tool Used: clinical judgement Functional Limitation: Mobility: Walking and moving around Mobility: Walking and Moving Around Current Status (U9811): At least 80 percent but less than 100 percent impaired, limited or restricted Mobility: Walking and Moving Around Goal Status [phone]): At least 40 percent but less than 60 percent impaired, limited or restricted   Lynnea Maizes PT, DPT   Kristie Bracewell 10/11/2015, 4:51 PM

## 2015-10-11 NOTE — Discharge Summary (Signed)
[facility] [place] [facility]   PATIENT NAME: Cheryl [name]    MR#:  [id number]  DATE OF BIRTH:  1951-09-24  DATE OF ADMISSION:  10/09/2015 ADMITTING PHYSICIAN: Shaune Pollack, MD  DATE OF DISCHARGE: No discharge date for patient encounter.  PRIMARY CARE PHYSICIAN: Mila Merry, MD     ADMISSION DIAGNOSIS:  Generalized abdominal pain [R10.84] Altered mental status, unspecified altered mental status type [R41.82]  DISCHARGE DIAGNOSIS:  Principal Problem:   Altered mental status Active Problems:   Acute delirium   Acute bronchitis   Dilated intrahepatic bile duct   Abdominal pain   Back pain, thoracic   Neuropathy (HCC)   Abnormal mammogram of right breast   Anemia   SECONDARY DIAGNOSIS:   Past Medical History  Diagnosis Date  . COPD (chronic obstructive pulmonary disease) (HCC)   . Hypertension   . Arthritis   . Hyperlipidemia   . GERD (gastroesophageal reflux disease)   . Neuropathy (HCC) 2010  . Oxygen deficiency   . Coronary artery disease   . Headache   . Anxiety   . Peripheral vascular disease (HCC)   . Vitamin D deficiency   . Migraines   . Chronic pain   . DVT (deep venous thrombosis) (HCC)   . Pneumonia   . Osteoporosis   . Allergy   . Asthma     .pro HOSPITAL COURSE:   Patient is 64 year old Caucasian female with past medical history significant for history of chronic pain syndrome, on multiple medications for the same, history of hypertension, COPD, peripheral  vascular disease, coronary artery disease who presents to the hospital with confusion, weakness. Patient's labs were unremarkable except of mild anemia with hemoglobin level of 11.1. Ammonia level was normal at 13. Lactic acid level was normal as well as troponin. Radiologic studies revealed atelectasis in upper lobes of lungs and scarring at left lung base. CT of head was unremarkable. CT of abdomen and pelvis with contrast revealed no acute findings, but dilated  biliary ducts. Patient was admitted to the hospital for further evaluation given IV fluids and her multiple psychotropic medications were scheduled according to prescription recommendations. With this, her condition improved and she woke up. It was felt that patient's altered mental status could have been related to multiple medication overuse/abuse. Patient was seen by psychiatrist who did not recommend, however, any psychiatric hospitalization or additional therapy, discussed with patient's husband and recommended to use medications judicially. Because of dilated. Ducts. Patient underwent MRCP which was unremarkable, no stone or mass were noted. Patient was also consulted by oncologist for right breast mass noted on recent mammogram. Oncologist is recommended to repeat mammogram and request an ultrasound of right breast. He felt that if masses. Confirmed by imaging studies. Patient should be referred to surgery for biopsy of the mass. It was felt that patient should follow-up with oncology as outpatient for further testing. Discussion by problem 1. Altered mental status of unclear etiology, patient was seen by psychologist. Maryclare Labrador felt that she has multiple medications could have been responsible, he discuss this with husband and recommended to use medications judicially and cautiously, no psychiatric therapy was recommended by psychiatrist. Head CT was unremarkable. Administering patient's medications to the schedule her altered mental status, resolved. Patient was seen by physical therapist and the home health physical therapy was recommended. Urine drug screen was negative 2. Dilated intrahepatic and extrahepatic ducts, unremarkable MRCP, no further testing. It could have been that patient passed stone and this  was the reason she had abdominal pain. No further stones were noted on MRCP 3. Right breast mass. Appreciate oncologist input, oncologist recommended to repeat mammogram and request ultrasound of  breast and get biopsy of the mass if imaging studies show it. Patient is to follow-up with oncologist as outpatient 4. Anemia, guaiac was ordered, but not obtained 5. COPD  exacerbation with acute bronchitis, patient is to continue Zithromax for 4 more days to complete course, she is to continue duo nebs, follow-up with primary care physician for recommendations 6. Poor oral intake, etiology is unclear, patient is to follow up with gastroenterologist for further recommendations and of abdomen was unremarkable for biliary ductal dilatation, but  MRCP was unremarkable . Patient may benefit from EGD as well as colonoscopy as outpatient .  DISCHARGE CONDITIONS:   Fair  CONSULTS OBTAINED:  Treatment Team:  Audery Amel, MD Gorden Harms, MD  DRUG ALLERGIES:   Allergies  Allergen Reactions  . Percocet [Oxycodone-Acetaminophen] Hives  . Aspirin Nausea Only and Other (See Comments)    Reaction:  GI upset   . Codeine Nausea Only and Other (See Comments)    Reaction:  GI upset   . Morphine Other (See Comments)    Reaction:  Unknown   . Pregabalin Other (See Comments)    Reaction:  Makes pt sleep excessively.    . Propoxyphene Other (See Comments)    Reaction:  GI upset   . Sulfa Antibiotics Rash and Other (See Comments)    Reaction:  GI upset     DISCHARGE MEDICATIONS:   Current Discharge Medication List    START taking these medications   Details  albuterol (PROVENTIL) (2.5 MG/3ML) 0.083% nebulizer solution Take 3 mLs (2.5 mg total) by nebulization every 2 (two) hours as needed for wheezing. Qty: 75 mL, Refills: 12    azithromycin (ZITHROMAX) 250 MG tablet Take 1 tablet (250 mg total) by mouth daily. Qty: 4 each, Refills: 0      CONTINUE these medications which have NOT CHANGED   Details  acetaminophen (TYLENOL) 500 MG tablet Take 1,000 mg by mouth every 6 (six) hours as needed for mild pain or headache.     ADVAIR DISKUS 250-50 MCG/DOSE AEPB inhale 1 dose by mouth twice a  day Qty: 60 each, Refills: 11    albuterol (PROVENTIL HFA;VENTOLIN HFA) 108 (90 BASE) MCG/ACT inhaler Inhale 2 puffs into the lungs every 4 (four) hours as needed for wheezing or shortness of breath. Qty: 18 g, Refills: 3    ALPRAZolam (XANAX) 0.5 MG tablet take 1 tablet by mouth every 6 hours if needed Qty: 60 tablet, Refills: 5    celecoxib (CELEBREX) 200 MG capsule Take 200 mg by mouth daily.    clopidogrel (PLAVIX) 75 MG tablet Take 75 mg by mouth daily.    diphenoxylate-atropine (LOMOTIL) 2.5-0.025 MG per tablet Take 2 tablets by mouth 2 (two) times daily as needed for diarrhea or loose stools.     donepezil (ARICEPT) 10 MG tablet Take 10 mg by mouth at bedtime.    estropipate (OGEN) 0.75 MG tablet Take 0.75 mg by mouth daily.    fentaNYL (DURAGESIC - DOSED MCG/HR) 50 MCG/HR Place 1 patch (50 mcg total) onto the skin every 3 (three) days. Qty: 10 patch, Refills: 0   Associated Diagnoses: Chronic pain syndrome    ferrous sulfate 325 (65 FE) MG tablet Take 325 mg by mouth daily with breakfast.    furosemide (LASIX) 40 MG tablet Take 40  mg by mouth daily.    lansoprazole (PREVACID) 30 MG capsule Take 30 mg by mouth daily.     meloxicam (MOBIC) 15 MG tablet Take 1 tablet (15 mg total) by mouth daily. Qty: 30 tablet, Refills: 2   Associated Diagnoses: Thoracic back pain, unspecified back pain laterality    mupirocin ointment (BACTROBAN) 2 % Apply 1 application topically 2 (two) times daily.     pantoprazole (PROTONIX) 40 MG tablet Take 40 mg by mouth daily.    pregabalin (LYRICA) 150 MG capsule Take 1 capsule (150 mg total) by mouth 2 (two) times daily. Qty: 60 capsule, Refills: 2   Associated Diagnoses: Neuropathy (HCC)    silver sulfADIAZINE (SILVADENE) 1 % cream Apply 1 application topically daily.    simvastatin (ZOCOR) 10 MG tablet Take 10 mg by mouth at bedtime.     SUMAtriptan (IMITREX) 25 MG tablet Take 25 mg by mouth as needed for migraine. May repeat in 2 hours  if headache persists or recurs.    tiotropium (SPIRIVA) 18 MCG inhalation capsule Place 18 mcg into inhaler and inhale daily.    traMADol (ULTRAM) 50 MG tablet Take 2 tablets (100 mg total) by mouth every 6 (six) hours as needed. Qty: 240 tablet, Refills: 2   Associated Diagnoses: Chronic pain syndrome      STOP taking these medications     amoxicillin-clavulanate (AUGMENTIN) 875-125 MG tablet          DISCHARGE INSTRUCTIONS:    Patient is to follow-up with primary care physician gastroenterologist oncologist as outpatient  If you experience worsening of your admission symptoms, develop shortness of breath, life threatening emergency, suicidal or homicidal thoughts you must seek medical attention immediately by calling 911 or calling your MD immediately  if symptoms less severe.  You Must read complete instructions/literature along with all the possible adverse reactions/side effects for all the Medicines you take and that have been prescribed to you. Take any new Medicines after you have completely understood and accept all the possible adverse reactions/side effects.   Please note  You were cared for by a hospitalist during your hospital stay. If you have any questions about your discharge medications or the care you received while you were in the hospital after you are discharged, you can call the unit and asked to speak with the hospitalist on call if the hospitalist that took care of you is not available. Once you are discharged, your primary care physician will handle any further medical issues. Please note that NO REFILLS for any discharge medications will be authorized once you are discharged, as it is imperative that you return to your primary care physician (or establish a relationship with a primary care physician if you do not have one) for your aftercare needs so that they can reassess your need for medications and monitor your lab values.    Today   CHIEF COMPLAINT:    Chief Complaint  Patient presents with  . Altered Mental Status    HISTORY OF PRESENT ILLNESS:  Shamra Bradeen  is a 64 y.o. female with a known history of chronic pain syndrome, on multiple medications for the same, history of hypertension, COPD, peripheral  vascular disease, coronary artery disease who presents to the hospital with confusion, weakness. Patient's labs were unremarkable except of mild anemia with hemoglobin level of 11.1. Ammonia level was normal at 13. Lactic acid level was normal as well as troponin. Radiologic studies revealed atelectasis in upper lobes of lungs and scarring  at left lung base. CT of head was unremarkable. CT of abdomen and pelvis with contrast revealed no acute findings, but dilated biliary ducts. Patient was admitted to the hospital for further evaluation given IV fluids and her multiple psychotropic medications were scheduled according to prescription recommendations. With this, her condition improved and she woke up. It was felt that patient's altered mental status could have been related to multiple medication overuse/abuse. Patient was seen by psychiatrist who did not recommend, however, any psychiatric hospitalization or additional therapy, discussed with patient's husband and recommended to use medications judicially. Because of dilated. Ducts. Patient underwent MRCP which was unremarkable, no stone or mass were noted. Patient was also consulted by oncologist for right breast mass noted on recent mammogram. Oncologist is recommended to repeat mammogram and request an ultrasound of right breast. He felt that if masses. Confirmed by imaging studies. Patient should be referred to surgery for biopsy of the mass. It was felt that patient should follow-up with oncology as outpatient for further testing. Discussion by problem 1. Altered mental status of unclear etiology, patient was seen by psychologist. Maryclare Labrador felt that she has multiple medications could have been  responsible, he discuss this with husband and recommended to use medications judicially and cautiously, no psychiatric therapy was recommended by psychiatrist. Head CT was unremarkable. Administering patient's medications to the schedule her altered mental status, resolved. Patient was seen by physical therapist and the home health physical therapy was recommended. Urine drug screen was negative 2. Dilated intrahepatic and extrahepatic ducts, unremarkable MRCP, no further testing. It could have been that patient passed stone and this was the reason she had abdominal pain. No further stones were noted on MRCP 3. Right breast mass. Appreciate oncologist input, oncologist recommended to repeat mammogram and request ultrasound of breast and get biopsy of the mass if imaging studies show it. Patient is to follow-up with oncologist as outpatient 4. Anemia, guaiac was ordered, but not obtained 5. COPD  exacerbation with acute bronchitis, patient is to continue Zithromax for 4 more days to complete course, she is to continue duo nebs, follow-up with primary care physician for recommendations 6. Poor oral intake, etiology is unclear, patient is to follow up with gastroenterologist for further recommendations and of abdomen was unremarkable for biliary ductal dilatation, but  MRCP was unremarkable . Patient may benefit from EGD as well as colonoscopy as outpatient .     VITAL SIGNS:  Blood pressure 157/58, pulse 72, temperature 98.6 F (37 C), temperature source Oral, resp. rate 17, height 5' (1.524 m), weight 42.185 kg (93 lb), SpO2 99 %.  I/O:   Intake/Output Summary (Last 24 hours) at 10/11/15 1723 Last data filed at 10/11/15 1608  Gross per 24 hour  Intake   2520 ml  Output   1426 ml  Net   1094 ml    PHYSICAL EXAMINATION:  GENERAL:  64 y.o.-year-old patient lying in the bed with no acute distress.  EYES: Pupils equal, round, reactive to light and accommodation. No scleral icterus. Extraocular  muscles intact.  HEENT: Head atraumatic, normocephalic. Oropharynx and nasopharynx clear.  NECK:  Supple, no jugular venous distention. No thyroid enlargement, no tenderness.  LUNGS: Normal breath sounds bilaterally, no wheezing, rales,rhonchi or crepitation. No use of accessory muscles of respiration.  CARDIOVASCULAR: S1, S2 normal. No murmurs, rubs, or gallops.  ABDOMEN: Soft, non-tender, non-distended. Bowel sounds present. No organomegaly or mass.  EXTREMITIES: No pedal edema, cyanosis, or clubbing.  NEUROLOGIC: Cranial nerves II through XII  are intact. Muscle strength 5/5 in all extremities. Sensation intact. Gait not checked.  PSYCHIATRIC: The patient is alert and oriented x 3.  SKIN: No obvious rash, lesion, or ulcer.   DATA REVIEW:   CBC  Recent Labs Lab 10/09/15 0855  WBC 9.6  HGB 11.1*  HCT 34.1*  PLT 235    Chemistries   Recent Labs Lab 10/09/15 0855  NA 138  K 3.7  CL 104  CO2 26  GLUCOSE 106*  BUN 12  CREATININE 0.75  CALCIUM 8.8*  AST 17  ALT 9*  ALKPHOS 86  BILITOT 0.3    Cardiac Enzymes  Recent Labs Lab 10/09/15 0855  TROPONINI <0.03    Microbiology Results  Results for orders placed or performed in visit on 09/06/15  Aerobic culture     Status: Abnormal   Collection Time: 09/06/15 12:00 AM  Result Value Ref Range Status   Aerobic Bacterial Culture Final report (A)  Final   Result 1 Staphylococcus aureus (A)  Final    Comment: Scant growth Based on resistance to penicillin and susceptibility to oxacillin this isolate would be susceptible to: * Penicillinase-stable penicillins; such as:     Cloxacillin     Dicloxacillin     Nafcillin * Beta-lactam/beta-lactamase inhibitor combinations; such as:     Amoxicillin-clavulanic acid     Ampicillin-sulbactam * Antistaphylococcal cephems; such as:     Cefaclor     Cefuroxime * Antistaphylococcal carbapenems; such as:     Imipenem     Meropenem    ANTIMICROBIAL SUSCEPTIBILITY Comment   Final    Comment:       ** S = Susceptible; I = Intermediate; R = Resistant **                    P = Positive; N = Negative             MICS are expressed in micrograms per mL    Antibiotic                 RSLT#1    RSLT#2    RSLT#3    RSLT#4 Ciprofloxacin                  S Clindamycin                    S Erythromycin                   S Gentamicin                     S Levofloxacin                   S Linezolid                      S Moxifloxacin                   S Oxacillin                      S Penicillin                     R Quinupristin/Dalfopristin      S Rifampin                       S Tetracycline  S Trimethoprim/Sulfa             S Vancomycin                     S     RADIOLOGY:  Mr Abd W/wo Cm/mrcp  10/11/2015  CLINICAL DATA:  Progressive intrahepatic/extrahepatic ductal dilatation, normal LFTs, abdominal pain. Confusion/dementia. EXAM: MRI ABDOMEN WITHOUT AND WITH CONTRAST (INCLUDING MRCP) TECHNIQUE: Multiplanar multisequence MR imaging of the abdomen was performed both before and after the administration of intravenous contrast. Heavily T2-weighted images of the biliary and pancreatic ducts were obtained, and three-dimensional MRCP images were rendered by post processing. CONTRAST:  8mL MULTIHANCE GADOBENATE DIMEGLUMINE 529 MG/ML IV SOLN COMPARISON:  CT abdomen pelvis dated 10/09/2015. FINDINGS: Motion degraded images. Lower chest:  Trace bilateral pleural effusions. Cardiomegaly. Hepatobiliary: Liver is grossly unremarkable. No suspicious/enhancing hepatic lesions. Status post cholecystectomy. Mild central intrahepatic ductal dilatation. Common duct measures 9 mm, within the upper limits of normal. No choledocholithiasis is seen. No evidence of obstructing mass. Pancreas: Within normal limits. No evidence of pancreatic ductal dilatation. Spleen: Within normal limits. Adrenals/Urinary Tract: Adrenal glands are within normal limits. Kidneys are within  normal limits.  No hydronephrosis. Stomach/Bowel: Stomach is notable for a moderate hiatal hernia. Visualized bowel is unremarkable. Vascular/Lymphatic: No evidence of abdominal aortic aneurysm. No suspicious abdominal lymphadenopathy. Other: No abdominal ascites. Musculoskeletal: Lumbar levoscoliosis. No focal osseous lesion is seen. IMPRESSION: Motion degraded images. Status post cholecystectomy. Mild central intrahepatic ductal dilatation. Common duct measures 9 mm, within the upper limits of normal. No choledocholithiasis is seen. No evidence of obstructing mass. While this appearance is progressive, in the setting of normal LFTs, the significance of this appearance is unclear. If there is clinical concern for distal CBD stricture or nonvisualized ampullary lesion, consider ERCP as clinically warranted. Electronically Signed   By: Charline Bills M.D.   On: 10/11/2015 13:18    EKG:   Orders placed or performed during the hospital encounter of 10/09/15  . ED EKG  . ED EKG      Management plans discussed with the patient, family and they are in agreement.  CODE STATUS:     Code Status Orders        Start     Ordered   10/09/15 1739  Full code   Continuous     10/09/15 1738      TOTAL TIME TAKING CARE OF THIS PATIENT: 40  minutes.    Katharina Caper M.D on 10/11/2015 at 5:23 PM  Between 7am to 6pm - Pager - [phone]  After 6pm go to [email or web address] - password [email or web address]  [place] [facility]  Office  [phone]  CC: Primary care physician; Mila Merry, MD

## 2015-10-12 DIAGNOSIS — R52 Pain, unspecified: Secondary | ICD-10-CM | POA: Diagnosis not present

## 2015-10-12 DIAGNOSIS — K838 Other specified diseases of biliary tract: Secondary | ICD-10-CM | POA: Diagnosis not present

## 2015-10-12 DIAGNOSIS — R410 Disorientation, unspecified: Secondary | ICD-10-CM | POA: Diagnosis not present

## 2015-10-12 DIAGNOSIS — G629 Polyneuropathy, unspecified: Secondary | ICD-10-CM | POA: Diagnosis not present

## 2015-10-12 DIAGNOSIS — N63 Unspecified lump in breast: Secondary | ICD-10-CM | POA: Diagnosis not present

## 2015-10-12 DIAGNOSIS — J449 Chronic obstructive pulmonary disease, unspecified: Secondary | ICD-10-CM | POA: Diagnosis not present

## 2015-10-12 DIAGNOSIS — J209 Acute bronchitis, unspecified: Secondary | ICD-10-CM | POA: Diagnosis not present

## 2015-10-12 DIAGNOSIS — R4182 Altered mental status, unspecified: Secondary | ICD-10-CM | POA: Diagnosis not present

## 2015-10-12 DIAGNOSIS — D649 Anemia, unspecified: Secondary | ICD-10-CM | POA: Diagnosis not present

## 2015-10-12 DIAGNOSIS — M549 Dorsalgia, unspecified: Secondary | ICD-10-CM | POA: Diagnosis not present

## 2015-10-12 DIAGNOSIS — R928 Other abnormal and inconclusive findings on diagnostic imaging of breast: Secondary | ICD-10-CM | POA: Diagnosis not present

## 2015-10-12 DIAGNOSIS — M6281 Muscle weakness (generalized): Secondary | ICD-10-CM | POA: Diagnosis not present

## 2015-10-12 MED ORDER — ALBUTEROL SULFATE (2.5 MG/3ML) 0.083% IN NEBU: 2.5000 mg | INHALATION_SOLUTION | RESPIRATORY_TRACT | Status: DC | PRN

## 2015-10-12 MED ORDER — PANTOPRAZOLE SODIUM 40 MG PO TBEC: 40.0000 mg | DELAYED_RELEASE_TABLET | Freq: Every day | ORAL | Status: DC

## 2015-10-12 NOTE — Discharge Instructions (Signed)
Follow all MD discharge instructions. Take all medications as prescribed. Keep all follow up appointments. If your symptoms return, call your doctor. If you experience any new symptoms that are of concern to you or that are bothersome to you, call your doctor. For all questions and/or concerns, call your doctor. ° ° If you have a medical emergency, call 911 ° ° ° °

## 2015-10-12 NOTE — Progress Notes (Signed)
Pt d/c home; d/c instructions reviewed w/ pt; pt and pt family understanding was verbalized; IV removed catheter in tact, gauze dressing applied; all pt questions answered; pt left unit via wheelchair accompanied by staff

## 2015-10-12 NOTE — Progress Notes (Signed)
Physical Therapy Treatment Patient Details Name: Cheryl [name] MRN: [id number] DOB: 22-Jun-1951 Today's Date: 10/12/2015    History of Present Illness Cheryl [name] is a 64 y.o. female with a known history of hypertension, COPD, PAD, CAD, dementia and chronic pain syndrome. The patient was sent to ED due to confusion today. The patient WAS confused and does not answer questions in the ED. According to the patient's husband, the patient was fine until the morning of arrival. She was found confused and complaining of chest and abdominal pain. The patient had multiple episodes similar problem before without causes found, after hospitalization for several days each time. Workup including CAT scan of the head, chest x-ray, lab and urinalysis in the ED were negative. Since the patient wasn't able to be sent home in this condition, Dr. Shaune PollackLord, ED physician, requested hospitalization for management until the patient is back to her baseline. The patient is taking many pain medications and prior admissions have attributed patient's symptoms to excessive pain medication usage. Husband reports multiple similar episodes which resolve after multiple days. Pt is known to this therapist from prior admission. Patient's baseline function is very poor and husband reports repeated falls at home, as many as 6 falls/wk. Pt uses rolling walker at home. She stays at the house alone during the daytime and husband calls pt every 1.5 hours to make sure she is alright. Patient's brother lives nearby and can check on the patient as needed but cannot stay with the patient. Patient does not have 24/7 assistance. Upon arrival pt is AOx1 with very minimal response to questions. She is extremely lethargic and does not open eyes throughout questioning. History provided by husband and daughter. Family very concerned about patient's possible discharge today.    PT Comments    Pt complains of fatigue; refuses out of bed at this time, but notes  she wants to get better. Agreeable to bed exercises. Requires some active assisted range of motion, but for the most part performs actively with poor eccentric control. Fatigues quickly. Pt educated on performing exercises throughout the day to build strength and endurance. Continue PT for progression of strength and endurance to improve functional mobility.   Follow Up Recommendations  Home health PT;Supervision/Assistance - 24 hour (needs up/out of bed)     Equipment Recommendations  None recommended by PT    Recommendations for Other Services       Precautions / Restrictions Precautions Precautions: Fall Restrictions Weight Bearing Restrictions: No    Mobility  Bed Mobility Overal bed mobility: Needs Assistance Bed Mobility:  (reposition upward in bed)     Supine to sit: Min assist (assists with B UEs and LEs; some pain in back)     General bed mobility comments: Refuses up in bed or out of bed  Transfers                    Ambulation/Gait                 Stairs            Wheelchair Mobility    Modified Rankin (Stroke Patients Only)       Balance                                    Cognition Arousal/Alertness: Awake/alert Behavior During Therapy: WFL for tasks assessed/performed Overall Cognitive Status: Within Functional Limits for tasks assessed  Exercises General Exercises - Lower Extremity Ankle Circles/Pumps: AROM;Both;20 reps;Supine Quad Sets: Strengthening;Both;Supine;15 reps Gluteal Sets: Strengthening;Both;Supine;15 reps Short Arc Quad: AROM;Both;20 reps;Supine Heel Slides: AROM;Both;Supine;10 reps;AAROM Hip ABduction/ADduction: AROM;Both;Supine;10 reps;AAROM Straight Leg Raises: AAROM;Both;Supine;10 reps    General Comments        Pertinent Vitals/Pain Pain Assessment: 0-10 Pain Score: 3  Pain Location: low back, chronic    Home Living                       Prior Function            PT Goals (current goals can now be found in the care plan section) Progress towards PT goals: Progressing toward goals (slowly; needs up/out of bed)    Frequency  Min 2X/week    PT Plan Current plan remains appropriate    Co-evaluation             End of Session   Activity Tolerance: Patient tolerated treatment well;Patient limited by fatigue (complains of legs being tired) Patient left: in bed;with call bell/phone within reach;with bed alarm set (had BLE pumps off; approved by nursing for 1 hour)     Time: 1059-     Charges:  $Therapeutic Exercise: 8-22 mins                    G Codes:      Kristeen Miss 10/12/2015, 11:11 AM

## 2015-10-12 NOTE — Care Management (Signed)
Spoke with patient and spouse concerning discharge plan. Patient stated that she uses a walker at home and has a Rolator that is broken. Will order her a new one. Patient has been open to [facility] previously and would like to continue with their services. Referral placed with Feliberto GottronJason Hinton at [facility]. Order for Nebulizer placed and Rolator placed with Will Dareen PianoAnderson at [facility]. Denies issues with medication purchasing or transportation. No other needs

## 2015-10-13 ENCOUNTER — Other Ambulatory Visit: Payer: Self-pay | Admitting: Family Medicine

## 2015-10-13 ENCOUNTER — Telehealth: Payer: Self-pay

## 2015-10-13 DIAGNOSIS — R928 Other abnormal and inconclusive findings on diagnostic imaging of breast: Secondary | ICD-10-CM

## 2015-10-13 NOTE — Telephone Encounter (Signed)
Cheryl FellersFrank called for the patient, she got discharged last night and appointment was made for November 22 nd at 3 pm.   Also he states patient had a right breast mass that was found during hospital stay and shewas advisedto have additional testing for this. It could be benign and he wants to have that in the works for her, he thinks its additional imaging and US done. Please review and let him know, thank you-aa

## 2015-10-13 NOTE — Telephone Encounter (Signed)
Please review. Thanks!  

## 2015-10-13 NOTE — Telephone Encounter (Signed)
Can you please check on this. It looks like mammogram and ultrasound were  Ordered at discharge, but i can't tell if they have been scheduled.

## 2015-10-14 DIAGNOSIS — F0390 Unspecified dementia without behavioral disturbance: Secondary | ICD-10-CM | POA: Diagnosis not present

## 2015-10-14 DIAGNOSIS — G609 Hereditary and idiopathic neuropathy, unspecified: Secondary | ICD-10-CM | POA: Diagnosis not present

## 2015-10-14 DIAGNOSIS — J209 Acute bronchitis, unspecified: Secondary | ICD-10-CM | POA: Diagnosis not present

## 2015-10-14 DIAGNOSIS — J440 Chronic obstructive pulmonary disease with acute lower respiratory infection: Secondary | ICD-10-CM | POA: Diagnosis not present

## 2015-10-14 DIAGNOSIS — G894 Chronic pain syndrome: Secondary | ICD-10-CM | POA: Diagnosis not present

## 2015-10-14 DIAGNOSIS — I739 Peripheral vascular disease, unspecified: Secondary | ICD-10-CM | POA: Diagnosis not present

## 2015-10-14 DIAGNOSIS — I10 Essential (primary) hypertension: Secondary | ICD-10-CM | POA: Diagnosis not present

## 2015-10-14 DIAGNOSIS — G43909 Migraine, unspecified, not intractable, without status migrainosus: Secondary | ICD-10-CM | POA: Diagnosis not present

## 2015-10-14 DIAGNOSIS — I2510 Atherosclerotic heart disease of native coronary artery without angina pectoris: Secondary | ICD-10-CM | POA: Diagnosis not present

## 2015-10-15 NOTE — Discharge Summary (Signed)
[facility] [place] at [facility]   PATIENT NAME: Cheryl [name]    MR#:  [id number]  DATE OF BIRTH:  12/24/1950  DATE OF ADMISSION:  10/09/2015 ADMITTING PHYSICIAN: Shaune Pollack, MD  DATE OF DISCHARGE: 10/12/2015  4:28 PM  PRIMARY CARE PHYSICIAN: Mila Merry, MD     ADMISSION DIAGNOSIS:  Generalized abdominal pain [R10.84] Altered mental status, unspecified altered mental status type [R41.82]  DISCHARGE DIAGNOSIS:  Principal Problem:   Altered mental status Active Problems:   Acute delirium   Acute bronchitis   Dilated intrahepatic bile duct   Abdominal pain   Back pain, thoracic   Neuropathy (HCC)   Abnormal mammogram of right breast   Anemia   SECONDARY DIAGNOSIS:   Past Medical History  Diagnosis Date  . COPD (chronic obstructive pulmonary disease) (HCC)   . Hypertension   . Arthritis   . Hyperlipidemia   . GERD (gastroesophageal reflux disease)   . Neuropathy (HCC) 2010  . Oxygen deficiency   . Coronary artery disease   . Headache   . Anxiety   . Peripheral vascular disease (HCC)   . Vitamin D deficiency   . Migraines   . Chronic pain   . DVT (deep venous thrombosis) (HCC)   . Pneumonia   . Osteoporosis   . Allergy   . Asthma     .pro HOSPITAL COURSE:   Patient is 64 year old Caucasian female with past medical history significant for history of chronic pain syndrome, on multiple medications for the same, history of hypertension, COPD, peripheral  vascular disease, coronary artery disease who presents to the hospital with confusion, weakness. Patient's labs were unremarkable except of mild anemia with hemoglobin level of 11.1. Ammonia level was normal at 13. Lactic acid level was normal as well as troponin. Radiologic studies revealed atelectasis in upper lobes of lungs and scarring at left lung base. CT of head was unremarkable. CT of abdomen and pelvis with contrast revealed no acute findings, but dilated biliary ducts. Patient  was admitted to the hospital for further evaluation given IV fluids and her multiple psychotropic medications were scheduled according to prescription recommendations. With this, her condition improved and she woke up. It was felt that patient's altered mental status could have been related to multiple medication overuse/abuse. Patient was seen by psychiatrist who did not recommend, however, any psychiatric hospitalization or additional therapy, discussed with patient's husband and recommended to use medications judicially. Because of dilated. Ducts. Patient underwent MRCP which was unremarkable, no stone or mass were noted. Patient was also consulted by oncologist for right breast mass noted on recent mammogram. Oncologist recommended to repeat mammogram and requested an ultrasound of right breast. He felt that if masses are confirmed by imaging studies, the patient should be referred to surgery for biopsy of the mass. It was felt that patient should follow-up with oncology as outpatient for further testing, but not have these studies performed while in hospital. Discussion by problem 1. Altered mental status of unclear etiology, patient was seen by psychiatrist, who felt that she has multiple medications, that  could have been responsible for confusion, AMS, he discussed this with husband and recommended to use medications judicially and cautiously, no psychiatric therapy was recommended by psychiatrist. Head CT was unremarkable. Administering patient's medications to the exact schedule,  her altered mental status resolved, signifying probable inappropriate medication administration/doses.. Patient was seen by physical therapist and the home health physical therapy was recommended. Urine drug screen was negative. 2.  Dilated intrahepatic and extrahepatic ducts, unremarkable MRCP, no further testing. Postulated that the patient could have  passed stone and this was the reason she had abdominal pain. No further  stones were noted on MRCP. 3. Right breast mass. Oncologist recommended to repeat mammogram and requested ultrasound of breast and get biopsy of the mass if imaging studies show it. Patient is to follow-up with oncologist as outpatient, no additional testing was performed in the hospital. 4. Anemia, guaiac was ordered, but not obtained 5. COPD  exacerbation with acute bronchitis, patient is to continue Zithromax for 4 more days to complete course, she is to continue duo nebs, follow-up with primary care physician for recommendations 6. Poor oral intake, etiology remained unclear, patient is to follow up with gastroenterologist for further recommendations , CT  abdomen was remarkable for biliary ductal dilatation, but  MRCP was unremarkable . Patient may benefit from EGD as well as colonoscopy as outpatient .  DISCHARGE CONDITIONS:   Fair  CONSULTS OBTAINED:  Treatment Team:  Audery Amel, MD Gorden Harms, MD  DRUG ALLERGIES:   Allergies  Allergen Reactions  . Percocet [Oxycodone-Acetaminophen] Hives  . Aspirin Nausea Only and Other (See Comments)    Reaction:  GI upset   . Codeine Nausea Only and Other (See Comments)    Reaction:  GI upset   . Morphine Other (See Comments)    Reaction:  Unknown   . Pregabalin Other (See Comments)    Reaction:  Makes pt sleep excessively.    . Propoxyphene Other (See Comments)    Reaction:  GI upset   . Sulfa Antibiotics Rash and Other (See Comments)    Reaction:  GI upset     DISCHARGE MEDICATIONS:   Discharge Medication List as of 10/12/2015  3:36 PM    START taking these medications   Details  azithromycin (ZITHROMAX) 250 MG tablet Take 1 tablet (250 mg total) by mouth daily., Starting 10/11/2015, Until Discontinued, Normal      CONTINUE these medications which have CHANGED   Details  albuterol (PROVENTIL) (2.5 MG/3ML) 0.083% nebulizer solution Take 3 mLs (2.5 mg total) by nebulization every 4 (four) hours as needed for wheezing.,  Starting 10/12/2015, Until Discontinued, Normal    pantoprazole (PROTONIX) 40 MG tablet Take 1 tablet (40 mg total) by mouth daily., Starting 10/12/2015, Until Discontinued, Normal      CONTINUE these medications which have NOT CHANGED   Details  acetaminophen (TYLENOL) 500 MG tablet Take 1,000 mg by mouth every 6 (six) hours as needed for mild pain or headache. , Until Discontinued, Historical Med    ADVAIR DISKUS 250-50 MCG/DOSE AEPB inhale 1 dose by mouth twice a day, Normal    albuterol (PROVENTIL HFA;VENTOLIN HFA) 108 (90 BASE) MCG/ACT inhaler Inhale 2 puffs into the lungs every 4 (four) hours as needed for wheezing or shortness of breath., Starting 08/11/2015, Until Discontinued, Normal    ALPRAZolam (XANAX) 0.5 MG tablet take 1 tablet by mouth every 6 hours if needed, Phone In    clopidogrel (PLAVIX) 75 MG tablet Take 75 mg by mouth daily., Until Discontinued, Historical Med    diphenoxylate-atropine (LOMOTIL) 2.5-0.025 MG per tablet Take 2 tablets by mouth 2 (two) times daily as needed for diarrhea or loose stools. , Until Discontinued, Historical Med    donepezil (ARICEPT) 10 MG tablet Take 10 mg by mouth at bedtime., Until Discontinued, Historical Med    estropipate (OGEN) 0.75 MG tablet Take 0.75 mg by mouth daily., Until Discontinued,  Historical Med    fentaNYL (DURAGESIC - DOSED MCG/HR) 50 MCG/HR Place 1 patch (50 mcg total) onto the skin every 3 (three) days., Starting 09/04/2015, Until Discontinued, Print    ferrous sulfate 325 (65 FE) MG tablet Take 325 mg by mouth daily with breakfast., Until Discontinued, Historical Med    furosemide (LASIX) 40 MG tablet Take 40 mg by mouth daily., Until Discontinued, Historical Med    mupirocin ointment (BACTROBAN) 2 % Apply 1 application topically 2 (two) times daily. , Until Discontinued, Historical Med    pregabalin (LYRICA) 150 MG capsule Take 1 capsule (150 mg total) by mouth 2 (two) times daily., Starting 09/04/2015, Until  Discontinued, Print    silver sulfADIAZINE (SILVADENE) 1 % cream Apply 1 application topically daily., Until Discontinued, Historical Med    simvastatin (ZOCOR) 10 MG tablet Take 10 mg by mouth at bedtime. , Until Discontinued, Historical Med    SUMAtriptan (IMITREX) 25 MG tablet Take 25 mg by mouth as needed for migraine. May repeat in 2 hours if headache persists or recurs., Until Discontinued, Historical Med    tiotropium (SPIRIVA) 18 MCG inhalation capsule Place 18 mcg into inhaler and inhale daily., Until Discontinued, Historical Med    traMADol (ULTRAM) 50 MG tablet Take 2 tablets (100 mg total) by mouth every 6 (six) hours as needed., Starting 09/04/2015, Until Discontinued, Print      STOP taking these medications     celecoxib (CELEBREX) 200 MG capsule      lansoprazole (PREVACID) 30 MG capsule      meloxicam (MOBIC) 15 MG tablet      amoxicillin-clavulanate (AUGMENTIN) 875-125 MG tablet          DISCHARGE INSTRUCTIONS:    Patient is to follow-up with primary care physician,  Gastroenterologist,  oncologist as outpatient  If you experience worsening of your admission symptoms, develop shortness of breath, life threatening emergency, suicidal or homicidal thoughts you must seek medical attention immediately by calling 911 or calling your MD immediately  if symptoms less severe.  You Must read complete instructions/literature along with all the possible adverse reactions/side effects for all the Medicines you take and that have been prescribed to you. Take any new Medicines after you have completely understood and accept all the possible adverse reactions/side effects.   Please note  You were cared for by a hospitalist during your hospital stay. If you have any questions about your discharge medications or the care you received while you were in the hospital after you are discharged, you can call the unit and asked to speak with the hospitalist on call if the hospitalist  that took care of you is not available. Once you are discharged, your primary care physician will handle any further medical issues. Please note that NO REFILLS for any discharge medications will be authorized once you are discharged, as it is imperative that you return to your primary care physician (or establish a relationship with a primary care physician if you do not have one) for your aftercare needs so that they can reassess your need for medications and monitor your lab values.    Today   CHIEF COMPLAINT:   Chief Complaint  Patient presents with  . Altered Mental Status    HISTORY OF PRESENT ILLNESS:  Cheryl [name]  is a 64 y.o. female with a known history of chronic pain syndrome, on multiple medications for the same, history of hypertension, COPD, peripheral  vascular disease, coronary artery disease who presents to the  hospital with confusion, weakness. Patient's labs were unremarkable except of mild anemia with hemoglobin level of 11.1. Ammonia level was normal at 13. Lactic acid level was normal as well as troponin. Radiologic studies revealed atelectasis in upper lobes of lungs and scarring at left lung base. CT of head was unremarkable. CT of abdomen and pelvis with contrast revealed no acute findings, but dilated biliary ducts. Patient was admitted to the hospital for further evaluation given IV fluids and her multiple psychotropic medications were scheduled according to prescription recommendations. With this, her condition improved and she woke up. It was felt that patient's altered mental status could have been related to multiple medication overuse/abuse. Patient was seen by psychiatrist who did not recommend, however, any psychiatric hospitalization or additional therapy, discussed with patient's husband and recommended to use medications judicially. Because of dilated. Ducts. Patient underwent MRCP which was unremarkable, no stone or mass were noted. Patient was also consulted by  oncologist for right breast mass noted on recent mammogram. Oncologist recommended to repeat mammogram and requested an ultrasound of right breast. He felt that if masses are confirmed by imaging studies, the patient should be referred to surgery for biopsy of the mass. It was felt that patient should follow-up with oncology as outpatient for further testing, but not have these studies performed while in hospital. Discussion by problem 1. Altered mental status of unclear etiology, patient was seen by psychiatrist, who felt that she has multiple medications, that  could have been responsible for confusion, AMS, he discussed this with husband and recommended to use medications judicially and cautiously, no psychiatric therapy was recommended by psychiatrist. Head CT was unremarkable. Administering patient's medications to the exact schedule,  her altered mental status resolved, signifying probable inappropriate medication administration/doses.. Patient was seen by physical therapist and the home health physical therapy was recommended. Urine drug screen was negative. 2. Dilated intrahepatic and extrahepatic ducts, unremarkable MRCP, no further testing. Postulated that the patient could have  passed stone and this was the reason she had abdominal pain. No further stones were noted on MRCP. 3. Right breast mass. Oncologist recommended to repeat mammogram and requested ultrasound of breast and get biopsy of the mass if imaging studies show it. Patient is to follow-up with oncologist as outpatient, no additional testing was performed in the hospital. 4. Anemia, guaiac was ordered, but not obtained 5. COPD  exacerbation with acute bronchitis, patient is to continue Zithromax for 4 more days to complete course, she is to continue duo nebs, follow-up with primary care physician for recommendations 6. Poor oral intake, etiology remained unclear, patient is to follow up with gastroenterologist for further  recommendations , CT  abdomen was remarkable for biliary ductal dilatation, but  MRCP was unremarkable . Patient may benefit from EGD as well as colonoscopy as outpatient .    VITAL SIGNS:  Blood pressure 150/56, pulse 62, temperature 100.2 F (37.9 C), temperature source Oral, resp. rate 16, height 5' (1.524 m), weight 42.185 kg (93 lb), SpO2 96 %.  I/O:  No intake or output data in the 24 hours ending 10/15/15 1030  PHYSICAL EXAMINATION:  GENERAL:  64 y.o.-year-old patient lying in the bed with no acute distress.  EYES: Pupils equal, round, reactive to light and accommodation. No scleral icterus. Extraocular muscles intact.  HEENT: Head atraumatic, normocephalic. Oropharynx and nasopharynx clear.  NECK:  Supple, no jugular venous distention. No thyroid enlargement, no tenderness.  LUNGS: Normal breath sounds bilaterally, no wheezing, rales,rhonchi or crepitation. No  use of accessory muscles of respiration.  CARDIOVASCULAR: S1, S2 normal. No murmurs, rubs, or gallops.  ABDOMEN: Soft, non-tender, non-distended. Bowel sounds present. No organomegaly or mass.  EXTREMITIES: No pedal edema, cyanosis, or clubbing.  NEUROLOGIC: Cranial nerves II through XII are intact. Muscle strength 5/5 in all extremities. Sensation intact. Gait not checked.  PSYCHIATRIC: The patient is alert and oriented x 3.  SKIN: No obvious rash, lesion, or ulcer.   DATA REVIEW:   CBC  Recent Labs Lab 10/09/15 0855  WBC 9.6  HGB 11.1*  HCT 34.1*  PLT 235    Chemistries   Recent Labs Lab 10/09/15 0855  NA 138  K 3.7  CL 104  CO2 26  GLUCOSE 106*  BUN 12  CREATININE 0.75  CALCIUM 8.8*  AST 17  ALT 9*  ALKPHOS 86  BILITOT 0.3    Cardiac Enzymes  Recent Labs Lab 10/09/15 0855  TROPONINI <0.03    Microbiology Results  Results for orders placed or performed in visit on 09/06/15  Aerobic culture     Status: Abnormal   Collection Time: 09/06/15 12:00 AM  Result Value Ref Range Status    Aerobic Bacterial Culture Final report (A)  Final   Result 1 Staphylococcus aureus (A)  Final    Comment: Scant growth Based on resistance to penicillin and susceptibility to oxacillin this isolate would be susceptible to: * Penicillinase-stable penicillins; such as:     Cloxacillin     Dicloxacillin     Nafcillin * Beta-lactam/beta-lactamase inhibitor combinations; such as:     Amoxicillin-clavulanic acid     Ampicillin-sulbactam * Antistaphylococcal cephems; such as:     Cefaclor     Cefuroxime * Antistaphylococcal carbapenems; such as:     Imipenem     Meropenem    ANTIMICROBIAL SUSCEPTIBILITY Comment  Final    Comment:       ** S = Susceptible; I = Intermediate; R = Resistant **                    P = Positive; N = Negative             MICS are expressed in micrograms per mL    Antibiotic                 RSLT#1    RSLT#2    RSLT#3    RSLT#4 Ciprofloxacin                  S Clindamycin                    S Erythromycin                   S Gentamicin                     S Levofloxacin                   S Linezolid                      S Moxifloxacin                   S Oxacillin                      S Penicillin  R Quinupristin/Dalfopristin      S Rifampin                       S Tetracycline                   S Trimethoprim/Sulfa             S Vancomycin                     S     RADIOLOGY:  No results found.  EKG:   Orders placed or performed during the hospital encounter of 10/09/15  . ED EKG  . ED EKG      Management plans discussed with the patient, family and they are in agreement.  CODE STATUS:     Code Status Orders        Start     Ordered   10/09/15 1739  Full code   Continuous     10/09/15 1738      TOTAL TIME TAKING CARE OF THIS PATIENT: 40  minutes.    Katharina Caper M.D on 10/15/2015 at 10:30 AM  Between 7am to 6pm - Pager - [phone]  After 6pm go to [email or web address] - password [email or web address] [facility]  [place]  [facility]  Office  [phone]  CC: Primary care physician; Mila Merry, MD

## 2015-10-16 ENCOUNTER — Other Ambulatory Visit: Payer: Self-pay | Admitting: *Deleted

## 2015-10-16 DIAGNOSIS — G894 Chronic pain syndrome: Secondary | ICD-10-CM | POA: Diagnosis not present

## 2015-10-16 DIAGNOSIS — I10 Essential (primary) hypertension: Secondary | ICD-10-CM | POA: Diagnosis not present

## 2015-10-16 DIAGNOSIS — G43909 Migraine, unspecified, not intractable, without status migrainosus: Secondary | ICD-10-CM | POA: Diagnosis not present

## 2015-10-16 DIAGNOSIS — I2510 Atherosclerotic heart disease of native coronary artery without angina pectoris: Secondary | ICD-10-CM | POA: Diagnosis not present

## 2015-10-16 DIAGNOSIS — I739 Peripheral vascular disease, unspecified: Secondary | ICD-10-CM | POA: Diagnosis not present

## 2015-10-16 DIAGNOSIS — F0390 Unspecified dementia without behavioral disturbance: Secondary | ICD-10-CM | POA: Diagnosis not present

## 2015-10-16 DIAGNOSIS — J209 Acute bronchitis, unspecified: Secondary | ICD-10-CM | POA: Diagnosis not present

## 2015-10-16 DIAGNOSIS — G609 Hereditary and idiopathic neuropathy, unspecified: Secondary | ICD-10-CM | POA: Diagnosis not present

## 2015-10-16 DIAGNOSIS — J440 Chronic obstructive pulmonary disease with acute lower respiratory infection: Secondary | ICD-10-CM | POA: Diagnosis not present

## 2015-10-16 NOTE — Patient Outreach (Signed)
RNCM called as part of the transition of care program. Transition of care flow sheet completed. Pt stated she was seeing primary care MD in the morning. Pt reported still feeling weak and having lost weight from a reported weight of 100lbs to a weight of 93lbs in just a couple of months. Pt alert and oriented to person place and time. Pt stating she was taking all her medications as directed. Pt states her husband fixes her pill box, does all the driving. Pt stated she was looking forward to getting her hair fixed tomorrow after the doctor. Pt states she continues to cough and have thick sputum. RNCM encouraged pt to make MD aware at her appointment tomorrow. RNCM set up a home visit with pt for next week.   Plan; RNCM will make a home visit to pt's home next week.   Costella HatcherJanci Offie Waide RN, BSN  [facility] [phone])

## 2015-10-16 NOTE — Addendum Note (Signed)
Addended by: Bary LericheLEATH, Faust Thorington J on: 10/16/2015 08:46 AM   Modules accepted: Orders

## 2015-10-17 ENCOUNTER — Inpatient Hospital Stay
Admission: EM | Admit: 2015-10-17 | Discharge: 2015-10-22 | DRG: 871 | Disposition: A | Discharge status: Home / Self Care | Payer: Commercial Managed Care - HMO | Attending: Internal Medicine | Admitting: Internal Medicine

## 2015-10-17 ENCOUNTER — Ambulatory Visit
Admission: RE | Admit: 2015-10-17 | Discharge: 2015-10-17 | Disposition: A | Discharge status: Home / Self Care | Payer: Commercial Managed Care - HMO | Source: Ambulatory Visit | Attending: Family Medicine | Admitting: Family Medicine

## 2015-10-17 ENCOUNTER — Ambulatory Visit (INDEPENDENT_AMBULATORY_CARE_PROVIDER_SITE_OTHER): Payer: Commercial Managed Care - HMO | Admitting: Family Medicine

## 2015-10-17 ENCOUNTER — Telehealth: Payer: Self-pay | Admitting: Family Medicine

## 2015-10-17 ENCOUNTER — Inpatient Hospital Stay: Payer: Self-pay | Admitting: Family Medicine

## 2015-10-17 ENCOUNTER — Encounter: Payer: Self-pay | Admitting: *Deleted

## 2015-10-17 ENCOUNTER — Encounter: Payer: Self-pay | Admitting: Family Medicine

## 2015-10-17 VITALS — BP 82/44 | HR 88 | Temp 98.2°F | Resp 16

## 2015-10-17 DIAGNOSIS — R110 Nausea: Secondary | ICD-10-CM | POA: Diagnosis not present

## 2015-10-17 DIAGNOSIS — B961 Klebsiella pneumoniae [K. pneumoniae] as the cause of diseases classified elsewhere: Secondary | ICD-10-CM | POA: Diagnosis not present

## 2015-10-17 DIAGNOSIS — M1990 Unspecified osteoarthritis, unspecified site: Secondary | ICD-10-CM | POA: Diagnosis not present

## 2015-10-17 DIAGNOSIS — Z720 Tobacco use: Secondary | ICD-10-CM | POA: Diagnosis not present

## 2015-10-17 DIAGNOSIS — J440 Chronic obstructive pulmonary disease with acute lower respiratory infection: Secondary | ICD-10-CM | POA: Diagnosis present

## 2015-10-17 DIAGNOSIS — Z9981 Dependence on supplemental oxygen: Secondary | ICD-10-CM | POA: Diagnosis not present

## 2015-10-17 DIAGNOSIS — M810 Age-related osteoporosis without current pathological fracture: Secondary | ICD-10-CM | POA: Diagnosis present

## 2015-10-17 DIAGNOSIS — K219 Gastro-esophageal reflux disease without esophagitis: Secondary | ICD-10-CM | POA: Diagnosis present

## 2015-10-17 DIAGNOSIS — Z9889 Other specified postprocedural states: Secondary | ICD-10-CM

## 2015-10-17 DIAGNOSIS — Z79899 Other long term (current) drug therapy: Secondary | ICD-10-CM

## 2015-10-17 DIAGNOSIS — Z882 Allergy status to sulfonamides status: Secondary | ICD-10-CM

## 2015-10-17 DIAGNOSIS — R05 Cough: Secondary | ICD-10-CM

## 2015-10-17 DIAGNOSIS — Z833 Family history of diabetes mellitus: Secondary | ICD-10-CM

## 2015-10-17 DIAGNOSIS — Z8249 Family history of ischemic heart disease and other diseases of the circulatory system: Secondary | ICD-10-CM | POA: Diagnosis not present

## 2015-10-17 DIAGNOSIS — J150 Pneumonia due to Klebsiella pneumoniae: Secondary | ICD-10-CM | POA: Diagnosis not present

## 2015-10-17 DIAGNOSIS — D631 Anemia in chronic kidney disease: Secondary | ICD-10-CM | POA: Diagnosis present

## 2015-10-17 DIAGNOSIS — Z716 Tobacco abuse counseling: Secondary | ICD-10-CM

## 2015-10-17 DIAGNOSIS — J189 Pneumonia, unspecified organism: Secondary | ICD-10-CM

## 2015-10-17 DIAGNOSIS — J4540 Moderate persistent asthma, uncomplicated: Secondary | ICD-10-CM

## 2015-10-17 DIAGNOSIS — I2510 Atherosclerotic heart disease of native coronary artery without angina pectoris: Secondary | ICD-10-CM | POA: Diagnosis not present

## 2015-10-17 DIAGNOSIS — R0602 Shortness of breath: Secondary | ICD-10-CM | POA: Diagnosis not present

## 2015-10-17 DIAGNOSIS — Z886 Allergy status to analgesic agent status: Secondary | ICD-10-CM | POA: Diagnosis not present

## 2015-10-17 DIAGNOSIS — J441 Chronic obstructive pulmonary disease with (acute) exacerbation: Secondary | ICD-10-CM | POA: Diagnosis not present

## 2015-10-17 DIAGNOSIS — R531 Weakness: Secondary | ICD-10-CM

## 2015-10-17 DIAGNOSIS — G894 Chronic pain syndrome: Secondary | ICD-10-CM | POA: Diagnosis present

## 2015-10-17 DIAGNOSIS — Y95 Nosocomial condition: Secondary | ICD-10-CM | POA: Diagnosis present

## 2015-10-17 DIAGNOSIS — N63 Unspecified lump in breast: Secondary | ICD-10-CM | POA: Diagnosis present

## 2015-10-17 DIAGNOSIS — I739 Peripheral vascular disease, unspecified: Secondary | ICD-10-CM | POA: Diagnosis present

## 2015-10-17 DIAGNOSIS — I10 Essential (primary) hypertension: Secondary | ICD-10-CM | POA: Diagnosis not present

## 2015-10-17 DIAGNOSIS — Z888 Allergy status to other drugs, medicaments and biological substances status: Secondary | ICD-10-CM | POA: Diagnosis not present

## 2015-10-17 DIAGNOSIS — G609 Hereditary and idiopathic neuropathy, unspecified: Secondary | ICD-10-CM | POA: Diagnosis not present

## 2015-10-17 DIAGNOSIS — Z809 Family history of malignant neoplasm, unspecified: Secondary | ICD-10-CM | POA: Diagnosis not present

## 2015-10-17 DIAGNOSIS — Z9049 Acquired absence of other specified parts of digestive tract: Secondary | ICD-10-CM

## 2015-10-17 DIAGNOSIS — F0390 Unspecified dementia without behavioral disturbance: Secondary | ICD-10-CM | POA: Diagnosis not present

## 2015-10-17 DIAGNOSIS — A419 Sepsis, unspecified organism: Principal | ICD-10-CM | POA: Diagnosis present

## 2015-10-17 DIAGNOSIS — I129 Hypertensive chronic kidney disease with stage 1 through stage 4 chronic kidney disease, or unspecified chronic kidney disease: Secondary | ICD-10-CM | POA: Diagnosis present

## 2015-10-17 DIAGNOSIS — G43909 Migraine, unspecified, not intractable, without status migrainosus: Secondary | ICD-10-CM | POA: Diagnosis not present

## 2015-10-17 DIAGNOSIS — Z90710 Acquired absence of both cervix and uterus: Secondary | ICD-10-CM

## 2015-10-17 DIAGNOSIS — N189 Chronic kidney disease, unspecified: Secondary | ICD-10-CM | POA: Diagnosis present

## 2015-10-17 DIAGNOSIS — E785 Hyperlipidemia, unspecified: Secondary | ICD-10-CM | POA: Diagnosis present

## 2015-10-17 DIAGNOSIS — Z981 Arthrodesis status: Secondary | ICD-10-CM

## 2015-10-17 DIAGNOSIS — R059 Cough, unspecified: Secondary | ICD-10-CM

## 2015-10-17 DIAGNOSIS — R0600 Dyspnea, unspecified: Secondary | ICD-10-CM | POA: Diagnosis not present

## 2015-10-17 DIAGNOSIS — R1110 Vomiting, unspecified: Secondary | ICD-10-CM | POA: Diagnosis not present

## 2015-10-17 DIAGNOSIS — Z90722 Acquired absence of ovaries, bilateral: Secondary | ICD-10-CM

## 2015-10-17 DIAGNOSIS — N390 Urinary tract infection, site not specified: Secondary | ICD-10-CM | POA: Diagnosis not present

## 2015-10-17 DIAGNOSIS — J209 Acute bronchitis, unspecified: Secondary | ICD-10-CM | POA: Diagnosis not present

## 2015-10-17 DIAGNOSIS — R112 Nausea with vomiting, unspecified: Secondary | ICD-10-CM

## 2015-10-17 DIAGNOSIS — F17210 Nicotine dependence, cigarettes, uncomplicated: Secondary | ICD-10-CM | POA: Diagnosis present

## 2015-10-17 DIAGNOSIS — J449 Chronic obstructive pulmonary disease, unspecified: Secondary | ICD-10-CM | POA: Diagnosis not present

## 2015-10-17 DIAGNOSIS — R5383 Other fatigue: Secondary | ICD-10-CM | POA: Diagnosis not present

## 2015-10-17 LAB — URINALYSIS COMPLETE WITH MICROSCOPIC (ARMC ONLY)
Bilirubin Urine: NEGATIVE
Glucose, UA: NEGATIVE mg/dL
Hgb urine dipstick: NEGATIVE
Ketones, ur: NEGATIVE mg/dL
Leukocytes, UA: NEGATIVE
Nitrite: NEGATIVE
PH: 5 (ref 5.0–8.0)
PROTEIN: NEGATIVE mg/dL
Specific Gravity, Urine: 1.005 (ref 1.005–1.030)

## 2015-10-17 LAB — COMPREHENSIVE METABOLIC PANEL
ALBUMIN: 3.3 g/dL — AB (ref 3.5–5.0)
ALK PHOS: 74 U/L (ref 38–126)
ALT: 8 U/L — ABNORMAL LOW (ref 14–54)
ANION GAP: 10 (ref 5–15)
AST: 15 U/L (ref 15–41)
BUN: 23 mg/dL — ABNORMAL HIGH (ref 6–20)
CALCIUM: 8.7 mg/dL — AB (ref 8.9–10.3)
CO2: 25 mmol/L (ref 22–32)
Chloride: 100 mmol/L — ABNORMAL LOW (ref 101–111)
Creatinine, Ser: 1.01 mg/dL — ABNORMAL HIGH (ref 0.44–1.00)
GFR calc Af Amer: >60 mL/min (ref >60)
GFR calc non Af Amer: 57 mL/min — ABNORMAL LOW (ref >60)
GLUCOSE: 98 mg/dL (ref 65–99)
POTASSIUM: 4.5 mmol/L (ref 3.5–5.1)
SODIUM: 135 mmol/L (ref 135–145)
Total Bilirubin: 0.5 mg/dL (ref 0.3–1.2)
Total Protein: 6.6 g/dL (ref 6.5–8.1)

## 2015-10-17 LAB — CBC
HEMATOCRIT: 32 % — AB (ref 35.0–47.0)
HEMOGLOBIN: 10.5 g/dL — AB (ref 12.0–16.0)
MCH: 30.5 pg (ref 26.0–34.0)
MCHC: 32.9 g/dL (ref 32.0–36.0)
MCV: 92.7 fL (ref 80.0–100.0)
PLATELETS: 327 10*3/uL (ref 150–440)
RBC: 3.45 MIL/uL — ABNORMAL LOW (ref 3.80–5.20)
RDW: 14.4 % (ref 11.5–14.5)
WBC: 22.2 10*3/uL — ABNORMAL HIGH (ref 3.6–11.0)

## 2015-10-17 LAB — EXPECTORATED SPUTUM ASSESSMENT W GRAM STAIN, RFLX TO RESP C

## 2015-10-17 LAB — EXPECTORATED SPUTUM ASSESSMENT W REFEX TO RESP CULTURE: SPECIAL REQUESTS: NORMAL

## 2015-10-17 LAB — TROPONIN I

## 2015-10-17 LAB — LACTIC ACID, PLASMA
LACTIC ACID, VENOUS: 2 mmol/L (ref 0.5–2.0)
Lactic Acid, Venous: 2.4 mmol/L (ref 0.5–2.0)

## 2015-10-17 LAB — PROTIME-INR
INR: 1.08
Prothrombin Time: 14.2 seconds (ref 11.4–15.0)

## 2015-10-17 LAB — TSH: TSH: 0.994 u[IU]/mL (ref 0.350–4.500)

## 2015-10-17 LAB — APTT: APTT: 38 s — AB (ref 24–36)

## 2015-10-17 LAB — AMYLASE: Amylase: 30 U/L (ref 28–100)

## 2015-10-17 MED ORDER — SODIUM CHLORIDE 0.9 % IV BOLUS (SEPSIS)
500.0000 mL | INTRAVENOUS | Status: AC
  Administered 2015-10-17: 500 mL via INTRAVENOUS

## 2015-10-17 MED ORDER — PIPERACILLIN-TAZOBACTAM 3.375 G IVPB
3.3750 g | Freq: Three times a day (TID) | INTRAVENOUS | Status: DC
  Filled 2015-10-17 (×3): qty 50

## 2015-10-17 MED ORDER — PREGABALIN 75 MG PO CAPS
150.0000 mg | ORAL_CAPSULE | Freq: Two times a day (BID) | ORAL | Status: DC
  Administered 2015-10-17 – 2015-10-21 (×9): 150 mg via ORAL
  Filled 2015-10-17 (×9): qty 2

## 2015-10-17 MED ORDER — PIPERACILLIN-TAZOBACTAM 3.375 G IVPB 30 MIN
3.3750 g | Freq: Once | INTRAVENOUS | Status: AC
  Administered 2015-10-17: 3.375 g via INTRAVENOUS
  Filled 2015-10-17: qty 50

## 2015-10-17 MED ORDER — MOMETASONE FURO-FORMOTEROL FUM 100-5 MCG/ACT IN AERO
2.0000 | INHALATION_SPRAY | Freq: Two times a day (BID) | RESPIRATORY_TRACT | Status: DC
  Administered 2015-10-18: 2 via RESPIRATORY_TRACT
  Filled 2015-10-17: qty 8.8

## 2015-10-17 MED ORDER — ALBUTEROL SULFATE (2.5 MG/3ML) 0.083% IN NEBU
2.5000 mg | INHALATION_SOLUTION | RESPIRATORY_TRACT | Status: DC | PRN
Start: 2015-10-17 — End: 2015-10-18

## 2015-10-17 MED ORDER — TRAMADOL HCL 50 MG PO TABS
100.0000 mg | ORAL_TABLET | Freq: Four times a day (QID) | ORAL | Status: DC
  Administered 2015-10-17 – 2015-10-21 (×16): 100 mg via ORAL
  Filled 2015-10-17 (×16): qty 2

## 2015-10-17 MED ORDER — ONDANSETRON HCL 4 MG/2ML IJ SOLN: 4.0000 mg | Freq: Four times a day (QID) | INTRAMUSCULAR | Status: DC | PRN

## 2015-10-17 MED ORDER — ALPRAZOLAM 0.5 MG PO TABS
0.5000 mg | ORAL_TABLET | Freq: Four times a day (QID) | ORAL | Status: DC | PRN
  Administered 2015-10-18 – 2015-10-21 (×5): 0.5 mg via ORAL
  Filled 2015-10-17 (×5): qty 1

## 2015-10-17 MED ORDER — FERROUS SULFATE 325 (65 FE) MG PO TABS
325.0000 mg | ORAL_TABLET | Freq: Every day | ORAL | Status: DC
  Administered 2015-10-18 – 2015-10-21 (×4): 325 mg via ORAL
  Filled 2015-10-17 (×4): qty 1

## 2015-10-17 MED ORDER — VANCOMYCIN HCL IN DEXTROSE 750-5 MG/150ML-% IV SOLN
750.0000 mg | INTRAVENOUS | Status: DC
  Administered 2015-10-18 – 2015-10-19 (×2): 750 mg via INTRAVENOUS
  Filled 2015-10-17 (×3): qty 150

## 2015-10-17 MED ORDER — CLOPIDOGREL BISULFATE 75 MG PO TABS
75.0000 mg | ORAL_TABLET | Freq: Every day | ORAL | Status: DC
  Administered 2015-10-18 – 2015-10-21 (×4): 75 mg via ORAL
  Filled 2015-10-17 (×4): qty 1

## 2015-10-17 MED ORDER — ONDANSETRON HCL 4 MG PO TABS
4.0000 mg | ORAL_TABLET | Freq: Four times a day (QID) | ORAL | Status: DC | PRN
  Filled 2015-10-17: qty 1

## 2015-10-17 MED ORDER — TIOTROPIUM BROMIDE MONOHYDRATE 18 MCG IN CAPS
18.0000 ug | ORAL_CAPSULE | Freq: Every day | RESPIRATORY_TRACT | Status: DC
  Administered 2015-10-18: 18 ug via RESPIRATORY_TRACT
  Filled 2015-10-17: qty 5

## 2015-10-17 MED ORDER — ESTROPIPATE 1.5 MG PO TABS
0.7500 mg | ORAL_TABLET | Freq: Every day | ORAL | Status: DC
  Administered 2015-10-18 – 2015-10-21 (×4): 0.75 mg via ORAL
  Filled 2015-10-17 (×5): qty 0.5
  Filled 2015-10-17 (×2): qty 1

## 2015-10-17 MED ORDER — VANCOMYCIN HCL IN DEXTROSE 1-5 GM/200ML-% IV SOLN
1000.0000 mg | Freq: Once | INTRAVENOUS | Status: AC
Start: 2015-10-17 — End: 2015-10-17
  Administered 2015-10-17: 1000 mg via INTRAVENOUS
  Filled 2015-10-17: qty 200

## 2015-10-17 MED ORDER — PIPERACILLIN-TAZOBACTAM 3.375 G IVPB 30 MIN: 3.3750 g | Freq: Three times a day (TID) | INTRAVENOUS | Status: DC

## 2015-10-17 MED ORDER — PIPERACILLIN-TAZOBACTAM 3.375 G IVPB
3.3750 g | Freq: Three times a day (TID) | INTRAVENOUS | Status: DC
  Administered 2015-10-18 – 2015-10-22 (×13): 3.375 g via INTRAVENOUS
  Filled 2015-10-17 (×15): qty 50

## 2015-10-17 MED ORDER — DONEPEZIL HCL 5 MG PO TABS
10.0000 mg | ORAL_TABLET | Freq: Every day | ORAL | Status: DC
  Administered 2015-10-18 – 2015-10-21 (×5): 10 mg via ORAL
  Filled 2015-10-17 (×6): qty 2

## 2015-10-17 MED ORDER — ENOXAPARIN SODIUM 40 MG/0.4ML ~~LOC~~ SOLN
40.0000 mg | SUBCUTANEOUS | Status: DC
  Administered 2015-10-17 – 2015-10-21 (×5): 40 mg via SUBCUTANEOUS
  Filled 2015-10-17 (×5): qty 0.4

## 2015-10-17 MED ORDER — ALBUTEROL SULFATE HFA 108 (90 BASE) MCG/ACT IN AERS: 2.0000 | INHALATION_SPRAY | RESPIRATORY_TRACT | Status: DC | PRN

## 2015-10-17 MED ORDER — SODIUM CHLORIDE 0.9 % IV SOLN
INTRAVENOUS | Status: DC
  Administered 2015-10-17 – 2015-10-20 (×3): via INTRAVENOUS

## 2015-10-17 MED ORDER — DIPHENOXYLATE-ATROPINE 2.5-0.025 MG PO TABS: 2.0000 | ORAL_TABLET | Freq: Two times a day (BID) | ORAL | Status: DC | PRN

## 2015-10-17 MED ORDER — SIMVASTATIN 20 MG PO TABS
10.0000 mg | ORAL_TABLET | Freq: Every day | ORAL | Status: DC
  Administered 2015-10-17 – 2015-10-21 (×5): 10 mg via ORAL
  Filled 2015-10-17 (×5): qty 1

## 2015-10-17 MED ORDER — PANTOPRAZOLE SODIUM 40 MG PO TBEC
40.0000 mg | DELAYED_RELEASE_TABLET | Freq: Two times a day (BID) | ORAL | Status: DC
  Administered 2015-10-18 – 2015-10-21 (×8): 40 mg via ORAL
  Filled 2015-10-17 (×8): qty 1

## 2015-10-17 MED ORDER — FENTANYL 50 MCG/HR TD PT72
50.0000 ug | MEDICATED_PATCH | TRANSDERMAL | Status: DC
  Administered 2015-10-17 – 2015-10-20 (×2): 50 ug via TRANSDERMAL
  Filled 2015-10-17 (×2): qty 1

## 2015-10-17 MED ORDER — SODIUM CHLORIDE 0.9 % IV BOLUS (SEPSIS)
1000.0000 mL | Freq: Once | INTRAVENOUS | Status: AC
  Administered 2015-10-17: 1000 mL via INTRAVENOUS

## 2015-10-17 NOTE — Progress Notes (Signed)
ANTIBIOTIC CONSULT NOTE - INITIAL  Pharmacy Consult for Vancomycin and Zosyn Indication: pneumonia  Allergies  Allergen Reactions  . Percocet [Oxycodone-Acetaminophen] Hives  . Aspirin Nausea Only and Other (See Comments)    Reaction:  GI upset   . Codeine Nausea Only and Other (See Comments)    Reaction:  GI upset   . Morphine Other (See Comments)    Reaction:  Unknown   . Pregabalin Other (See Comments)    Reaction:  Makes pt sleep excessively.    . Propoxyphene Other (See Comments)    Reaction:  GI upset   . Sulfa Antibiotics Rash and Other (See Comments)    Reaction:  GI upset     Patient Measurements: Height: 5\' 1"  (154.9 cm) Weight: 100 lb (45.36 kg) IBW/kg (Calculated) : 47.8  Vital Signs: Temp: 98.8 F (37.1 C) (11/22 1733) Temp Source: Oral (11/22 1733) BP: 110/50 mmHg (11/22 1823) Pulse Rate: 79 (11/22 1823) Intake/Output from previous day:   Intake/Output from this shift:    Labs:  Recent Labs  10/17/15 1142  WBC 22.2*  HGB 10.5*  PLT 327  CREATININE 1.01*   Estimated Creatinine Clearance: 39.3 mL/min (by C-G formula based on Cr of 1.01). No results for input(s): VANCOTROUGH, VANCOPEAK, VANCORANDOM, GENTTROUGH, GENTPEAK, GENTRANDOM, TOBRATROUGH, TOBRAPEAK, TOBRARND, AMIKACINPEAK, AMIKACINTROU, AMIKACIN in the last 72 hours.   Microbiology: No results found for this or any previous visit (from the past 720 hour(s)).  Medical History: Past Medical History  Diagnosis Date  . COPD (chronic obstructive pulmonary disease) (HCC)   . Hypertension   . Arthritis   . Hyperlipidemia   . GERD (gastroesophageal reflux disease)   . Neuropathy (HCC) 2010  . Oxygen deficiency   . Coronary artery disease   . Headache   . Anxiety   . Peripheral vascular disease (HCC)   . Vitamin D deficiency   . Migraines   . Chronic pain   . DVT (deep venous thrombosis) (HCC)   . Pneumonia   . Osteoporosis   . Allergy   . Asthma     Medications:  Scheduled:  .  [START ON 10/18/2015] vancomycin  750 mg Intravenous Q24H   Assessment: Patient is a 64 yo female presenting to ED for pneumonia.  Vancomycin and Zosyn therapy desired for empiric coverage.   SCr: 1.01, est CrCl~39.3 mL/min, ke: 0.037, t1/2: 18.7 h, Vd: 31.8 L  Goal of Therapy:  Vancomycin trough level 15-20 mcg/ml  Plan:  Patient received Vancomycin 1 gm IV once at 1819 on 11/22.  Will order Vancomycin 750 mg IV q24h to start at 1630 on 11/23 (21 hours after first dose for stack).  Will check trough prior to 4th dose on 11/25 at 1600.   Will order Zosyn 3.375 gm IV q8h per EI protocol based on renal function.   Pharmacy will continue to follow.   Curtina Grills G 10/17/2015,6:43 PM

## 2015-10-17 NOTE — ED Notes (Signed)
Pharm called for med 

## 2015-10-17 NOTE — ED Notes (Signed)
Pharm called  For meds

## 2015-10-17 NOTE — Progress Notes (Addendum)
Patient: Cheryl [name] Female    DOB: 1951/06/02   64 y.o.   MRN: [id number] Visit Date: 10/17/2015  Today's Provider: Mila Merryonald Jenniefer Salak, MD   Chief Complaint  Patient presents with  . Hospitalization Follow-up  . Fatigue   Subjective:    HPI   Follow up Hospitalization  Patient was admitted to [facility] on 10/09/2015 and discharged on 10/12/2015. She was treated for Abdominal pain COPD exacerbation and altered mental status. She had psychiatric evaluation due to altered mental status which was ultimately thought to be related to her pain medication.  She was prescribed Zpack for COPD exacerbation.  She states her cough has not improved at all since hospital admission.   CT abdomen was remarkable for biliary ductal dilatation, but MRCP was unremarkable . She was advised to follow up with GI. Her husband states that she was very weak even at the time of discharge and he had to carry her into house. Home PT has been working with her and send note stating she had fallen today.   ------------------------------------------------------------------------------------ Weakness: Patient state she has been extremely weak since discharge. She is unable to get up unassisted.  She started having nausea and vomiting overnight, states she has had several episodes of emesis this morning. She has had no abdominal pain since discharge. She has not had any diarrhea. She did have some chills overnight.       Allergies  Allergen Reactions  . Percocet [Oxycodone-Acetaminophen] Hives  . Aspirin Nausea Only and Other (See Comments)    Reaction:  GI upset   . Codeine Nausea Only and Other (See Comments)    Reaction:  GI upset   . Morphine Other (See Comments)    Reaction:  Unknown   . Pregabalin Other (See Comments)    Reaction:  Makes pt sleep excessively.    . Propoxyphene Other (See Comments)    Reaction:  GI upset   . Sulfa Antibiotics Rash and Other (See Comments)    Reaction:  GI upset    Previous Medications   ACETAMINOPHEN (TYLENOL) 500 MG TABLET    Take 1,000 mg by mouth every 6 (six) hours as needed for mild pain or headache.    ADVAIR DISKUS 250-50 MCG/DOSE AEPB    inhale 1 dose by mouth twice a day   ALBUTEROL (PROVENTIL HFA;VENTOLIN HFA) 108 (90 BASE) MCG/ACT INHALER    Inhale 2 puffs into the lungs every 4 (four) hours as needed for wheezing or shortness of breath.   ALBUTEROL (PROVENTIL) (2.5 MG/3ML) 0.083% NEBULIZER SOLUTION    Take 3 mLs (2.5 mg total) by nebulization every 4 (four) hours as needed for wheezing.   ALPRAZOLAM (XANAX) 0.5 MG TABLET    take 1 tablet by mouth every 6 hours if needed   AZITHROMYCIN (ZITHROMAX) 250 MG TABLET    Take 1 tablet (250 mg total) by mouth daily.   CLOPIDOGREL (PLAVIX) 75 MG TABLET    Take 75 mg by mouth daily.   DIPHENOXYLATE-ATROPINE (LOMOTIL) 2.5-0.025 MG PER TABLET    Take 2 tablets by mouth 2 (two) times daily as needed for diarrhea or loose stools.    DONEPEZIL (ARICEPT) 10 MG TABLET    Take 10 mg by mouth at bedtime.   ESTROPIPATE (OGEN) 0.75 MG TABLET    Take 0.75 mg by mouth daily.   FENTANYL (DURAGESIC - DOSED MCG/HR) 50 MCG/HR    Place 1 patch (50 mcg total) onto the skin  every 3 (three) days.   FERROUS SULFATE 325 (65 FE) MG TABLET    Take 325 mg by mouth daily with breakfast.   FUROSEMIDE (LASIX) 40 MG TABLET    Take 40 mg by mouth daily.   MUPIROCIN OINTMENT (BACTROBAN) 2 %    Apply 1 application topically 2 (two) times daily.    PANTOPRAZOLE (PROTONIX) 40 MG TABLET    Take 1 tablet (40 mg total) by mouth daily.   PREGABALIN (LYRICA) 150 MG CAPSULE    Take 1 capsule (150 mg total) by mouth 2 (two) times daily.   SILVER SULFADIAZINE (SILVADENE) 1 % CREAM    Apply 1 application topically daily.   SIMVASTATIN (ZOCOR) 10 MG TABLET    Take 10 mg by mouth at bedtime.    SUMATRIPTAN (IMITREX) 25 MG TABLET    Take 25 mg by mouth as needed for migraine. May repeat in 2 hours if headache persists or  recurs.   TIOTROPIUM (SPIRIVA) 18 MCG INHALATION CAPSULE    Place 18 mcg into inhaler and inhale daily.   TRAMADOL (ULTRAM) 50 MG TABLET    Take 2 tablets (100 mg total) by mouth every 6 (six) hours as needed.    Review of Systems  Constitutional: Positive for chills and fatigue. Negative for fever and appetite change.  HENT: Positive for rhinorrhea.   Respiratory: Positive for cough (productive with yellow phlegm). Negative for chest tightness and shortness of breath.   Cardiovascular: Negative for chest pain and palpitations.  Gastrointestinal: Positive for nausea and vomiting. Negative for abdominal pain.  Neurological: Positive for weakness. Negative for dizziness.  Psychiatric/Behavioral: Positive for confusion.    Social History  Substance Use Topics  . Smoking status: Current Every Day Smoker -- 1.00 packs/day for 50 years    Types: Cigarettes  . Smokeless tobacco: Not on file  . Alcohol Use: No   Objective:   BP 82/44 mmHg  Pulse 88  Temp(Src) 98.2 F (36.8 C) (Oral)  Resp 16  SpO2 90%  Physical Exam   General Appearance:    Alert, cooperative, no distress. Appears fatigued. Appears much older than chronological age.   Eyes:    PERRL, conjunctiva/corneas clear, EOM's intact       Lungs:     Clear to auscultation bilaterally, respirations unlabored  Heart:    Regular rate and rhythm  Neurologic:   Awake, alert, oriented x 3. No apparent focal neurological           defect.           Assessment & Plan:     1. Non-intractable vomiting with nausea, vomiting of unspecified type  - Comprehensive metabolic panel - CBC - Amylase  2. Weakness generalized  - T4 AND TSH  3. Cough Has completed Zpack.  - DG Chest 2 View; Future  If labs are normal will start her promethazine. She is likely having some effects from polypharmacy. Consider stopping or reduce statin and psychotropic medications. Also advised not to take tramadol for the time being.       Mila Merry, MD  [facility]   Dg Chest 2 View  10/17/2015  CLINICAL DATA:  Persistent cough, COPD exacerbation, altered mental status.   EXAM: CHEST  2 VIEW COMPARISON:  10/09/2015   FINDINGS: Limited rotated exam to the right. Worsening patchy nodular airspace process throughout the entire right lung and to a lesser degree in the left lower lobe. Minor streaky left  base atelectasis. No large effusion or pneumothorax. Atherosclerosis of the aorta.  IMPRESSION: Extensive diffuse right lung nodular airspace opacities and to a less degree in the left lower lobe compatible with pneumonia. Left basilar atelectasis Recommend radiographic follow-up to document resolution. Electronically Signed   By: Judie Petit.  Shick M.D.   On: 10/17/2015 14:14   Lab Results  Component Value Date   WBC 22.2* 10/17/2015   HGB 10.5* 10/17/2015   HCT 32.0* 10/17/2015   MCV 92.7 10/17/2015   PLT 327 10/17/2015    Considering extensive opacities, mild hypoxia, poor overall health, very high WBC, vomiting and unlikely ability to tolerate oral antibiotics, patient was advised to go to ER to initiate IV antibiotics and possible admission.

## 2015-10-17 NOTE — H&P (Signed)
[facility] -  at [facility]   PATIENT NAME: Cheryl [name]    MR#:  [id number]  DATE OF BIRTH:  [birth year]  DATE OF ADMISSION:  10/17/2015  PRIMARY CARE PHYSICIAN: Mila Merryonald Fisher, MD   REQUESTING/REFERRING PHYSICIAN: Dr York CeriseForbach  CHIEF COMPLAINT:  Not feeling well, weakness, intractable vomiting and cough.  HISTORY OF PRESENT ILLNESS:  Cheryl DimmerMary Cumpston  is a 64 y.o. female with a known history of COPD with ongoing tobacco abuse, GERD, hyperlipidemia, chronic pain comes to the emergency room accompanied by her husband with ongoing shortness of breath weakness and difficulty getting around at home. Patient was recently admitted with COPD exacerbation was discharged with a Z-Pak completed it. She started having intractable vomiting yesterday per husband. She was seen today in Dr. Theodis AguasFisher's office and routine lab work showed white count of 22,000. Chest x-ray shows extensive pneumonia right sided. She was asked to come to the emergency room. The ER she received IV Vancomycin and Zosyn. She was found to be hypotensive with blood pressure systolic in the 80s admitted white count chest exercise suggestive of pneumonia. Patient is being admitted with sepsis secondary to Health care acquired Pneumonia  PAST MEDICAL HISTORY:   Past Medical History  Diagnosis Date  . COPD (chronic obstructive pulmonary disease) (HCC)   . Hypertension   . Arthritis   . Hyperlipidemia   . GERD (gastroesophageal reflux disease)   . Neuropathy (HCC) 2010  . Oxygen deficiency   . Coronary artery disease   . Headache   . Anxiety   . Peripheral vascular disease (HCC)   . Vitamin D deficiency   . Migraines   . Chronic pain   . DVT (deep venous thrombosis) (HCC)   . Pneumonia   . Osteoporosis   . Allergy   . Asthma     PAST SURGICAL HISTOIRY:   Past Surgical History  Procedure Laterality Date  . Appendectomy    . Spine surgery    . Foot surgery Bilateral     5-6 years per  patient  . Cardiac catherization  10/31/2009  . Abdomnal aortic stent  05/30/2008    Dr. Nanetta BattyJonathan Berry  . Abdominal hysterectomy  1975    Bilaterl Oophorectomy; Dur to IUD infection  . Cholecystectomy  1972  . Cervical fusion  C5 - 6/C6-7  . Appendectomy    . Colonoscopy with propofol N/A 07/27/2015    Procedure: COLONOSCOPY WITH PROPOFOL;  Surgeon: Wallace CullensPaul Y Oh, MD;  [facility];  Service: Gastroenterology;  Laterality: N/A;  . Esophagogastroduodenoscopy (egd) with propofol N/A 07/27/2015    Procedure: ESOPHAGOGASTRODUODENOSCOPY (EGD) WITH PROPOFOL;  Surgeon: Wallace CullensPaul Y Oh, MD;  Location[facility];  Service: Gastroenterology;  Laterality: N/A;    SOCIAL HISTORY:   Social History  Substance Use Topics  . Smoking status: Current Every Day Smoker -- 1.00 packs/day for 50 years    Types: Cigarettes  . Smokeless tobacco: Not on file  . Alcohol Use: No    FAMILY HISTORY:   Family History  Problem Relation Age of Onset  . Cancer Mother   . Arthritis Mother   . Heart disease Mother   . Diabetes Mother     mellitus, type 2  . Heart disease Father   . Diabetes Sister   . Cancer Brother   . Cancer Brother     lung  . Diabetes Brother     DRUG ALLERGIES:   Allergies  Allergen Reactions  . Percocet [Oxycodone-Acetaminophen] Hives  .  Aspirin Nausea And Vomiting and Other (See Comments)    Reaction:  GI upset   . Codeine Nausea And Vomiting and Other (See Comments)    Reaction:  GI upset   . Morphine Other (See Comments)    Reaction:  Unknown   . Pregabalin Other (See Comments)    Reaction:  Makes pt sleep excessively.    . Propoxyphene Other (See Comments)    Reaction:  GI upset   . Sulfa Antibiotics Rash and Other (See Comments)    Reaction:  GI upset     REVIEW OF SYSTEMS:  Review of Systems  Constitutional: Positive for malaise/fatigue. Negative for fever, chills and weight loss.  HENT: Negative for ear discharge, ear pain and nosebleeds.   Eyes:  Negative for blurred vision, pain and discharge.  Respiratory: Positive for cough and shortness of breath. Negative for sputum production, wheezing and stridor.   Cardiovascular: Negative for chest pain, palpitations, orthopnea and PND.  Gastrointestinal: Positive for nausea and vomiting. Negative for abdominal pain and diarrhea.  Genitourinary: Negative for urgency and frequency.  Musculoskeletal: Negative for back pain and joint pain.  Neurological: Positive for weakness. Negative for sensory change, speech change and focal weakness.  Psychiatric/Behavioral: Negative for depression and hallucinations. The patient is not nervous/anxious.      MEDICATIONS AT HOME:   Prior to Admission medications   Medication Sig Start Date End Date Taking? Authorizing Provider  acetaminophen (TYLENOL) 500 MG tablet Take 1,000 mg by mouth every 6 (six) hours as needed for mild pain or headache.    Yes Historical Provider, MD  albuterol (PROVENTIL HFA;VENTOLIN HFA) 108 (90 BASE) MCG/ACT inhaler Inhale 2 puffs into the lungs every 4 (four) hours as needed for wheezing or shortness of breath. 08/11/15  Yes Malva Limes, MD  albuterol (PROVENTIL) (2.5 MG/3ML) 0.083% nebulizer solution Take 3 mLs (2.5 mg total) by nebulization every 4 (four) hours as needed for wheezing. 10/12/15  Yes Katharina Caper, MD  ALPRAZolam Prudy Feeler) 0.5 MG tablet Take 0.5 mg by mouth every 6 (six) hours as needed for anxiety.   Yes Historical Provider, MD  celecoxib (CELEBREX) 200 MG capsule Take 200 mg by mouth daily.   Yes Historical Provider, MD  clopidogrel (PLAVIX) 75 MG tablet Take 75 mg by mouth daily.   Yes Historical Provider, MD  diphenoxylate-atropine (LOMOTIL) 2.5-0.025 MG per tablet Take 2 tablets by mouth 2 (two) times daily as needed for diarrhea or loose stools.    Yes Historical Provider, MD  donepezil (ARICEPT) 10 MG tablet Take 10 mg by mouth at bedtime.   Yes Historical Provider, MD  estropipate (OGEN) 0.75 MG tablet Take  0.75 mg by mouth daily.   Yes Historical Provider, MD  fentaNYL (DURAGESIC - DOSED MCG/HR) 50 MCG/HR Place 1 patch (50 mcg total) onto the skin every 3 (three) days. 09/04/15  Yes Delano Metz, MD  ferrous sulfate 325 (65 FE) MG tablet Take 325 mg by mouth daily.    Yes Historical Provider, MD  Fluticasone-Salmeterol (ADVAIR) 250-50 MCG/DOSE AEPB Inhale 1 puff into the lungs 2 (two) times daily.   Yes Historical Provider, MD  furosemide (LASIX) 40 MG tablet Take 40 mg by mouth daily.   Yes Historical Provider, MD  mupirocin cream (BACTROBAN) 2 % Apply 1 application topically 3 (three) times daily.   Yes Historical Provider, MD  pantoprazole (PROTONIX) 40 MG tablet Take 40 mg by mouth 2 (two) times daily.   Yes Historical Provider, MD  pregabalin (LYRICA)  150 MG capsule Take 1 capsule (150 mg total) by mouth 2 (two) times daily. 09/04/15  Yes Delano Metz, MD  silver sulfADIAZINE (SILVADENE) 1 % cream Apply 1 application topically daily as needed (for redness on legs).    Yes Historical Provider, MD  simvastatin (ZOCOR) 10 MG tablet Take 10 mg by mouth at bedtime.    Yes Historical Provider, MD  SUMAtriptan (IMITREX) 25 MG tablet Take 25 mg by mouth as needed for migraine. May repeat in 2 hours if headache persists or recurs.   Yes Historical Provider, MD  tiotropium (SPIRIVA) 18 MCG inhalation capsule Place 18 mcg into inhaler and inhale daily.   Yes Historical Provider, MD  traMADol (ULTRAM) 50 MG tablet Take 100 mg by mouth 4 (four) times daily.   Yes Historical Provider, MD  azithromycin (ZITHROMAX) 250 MG tablet Take 1 tablet (250 mg total) by mouth daily. Patient not taking: Reported on 10/17/2015 10/11/15   Katharina Caper, MD      VITAL SIGNS:  Blood pressure 105/86, pulse 80, temperature 98.8 F (37.1 C), temperature source Oral, resp. rate 16, height 5\' 1"  (1.549 m), weight 45.36 kg (100 lb), SpO2 98 %.  PHYSICAL EXAMINATION:  GENERAL:  64 y.o.-year-old patient lying in the  bed with no acute distress.  EYES: Pupils equal, round, reactive to light and accommodation. No scleral icterus. Extraocular muscles intact.  HEENT: Head atraumatic, normocephalic. Oropharynx and nasopharynx clear.  NECK:  Supple, no jugular venous distention. No thyroid enlargement, no tenderness.  LUNGS: distant breath sounds bilaterally, no wheezing, no rales, ++ rhonchi or crepitation. No use of accessory muscles of respiration.  CARDIOVASCULAR: S1, S2 normal. No murmurs, rubs, or gallops.  ABDOMEN: Soft, nontender, nondistended. Bowel sounds present. No organomegaly or mass.  EXTREMITIES: No pedal edema, cyanosis, or clubbing.  NEUROLOGIC: Cranial nerves II through XII are intact. Muscle strength 5/5 in all extremities. Sensation intact. Gait not checked.  PSYCHIATRIC: lethargic /sleepy SKIN: No obvious rash, lesion, or ulcer.   LABORATORY PANEL:   CBC  Recent Labs Lab 10/17/15 1142  WBC 22.2*  HGB 10.5*  HCT 32.0*  PLT 327   ------------------------------------------------------------------------------------------------------------------  Chemistries   Recent Labs Lab 10/17/15 1142  NA 135  K 4.5  CL 100*  CO2 25  GLUCOSE 98  BUN 23*  CREATININE 1.01*  CALCIUM 8.7*  AST 15  ALT 8*  ALKPHOS 74  BILITOT 0.5   ------------------------------------------------------------------------------------------------------------------  Cardiac Enzymes  Recent Labs Lab 10/17/15 1813  TROPONINI <0.03   ------------------------------------------------------------------------------------------------------------------  RADIOLOGY:  Dg Chest 2 View  10/17/2015  CLINICAL DATA:  Persistent cough, COPD exacerbation, altered mental status. EXAM: CHEST  2 VIEW COMPARISON:  10/09/2015 FINDINGS: Limited rotated exam to the right. Worsening patchy nodular airspace process throughout the entire right lung and to a lesser degree in the left lower lobe. Minor streaky left base  atelectasis. No large effusion or pneumothorax. Atherosclerosis of the aorta. IMPRESSION: Extensive diffuse right lung nodular airspace opacities and to a less degree in the left lower lobe compatible with pneumonia. Left basilar atelectasis Recommend radiographic follow-up to document resolution. Electronically Signed   By: Judie Petit.  Shick M.D.   On: 10/17/2015 14:14    EKG:  NSR  IMPRESSION AND PLAN:  Nykayla Marcelli  is a 64 y.o. female with a known history of COPD with ongoing tobacco abuse, GERD, hyperlipidemia, chronic pain comes to the emergency room accompanied by her husband with ongoing shortness of breath weakness and difficulty getting around at home.  1.Sepsis secondary to right upper lobe pneumonia. There could be a component of aspiration. Patient presented with hypotension, shortness of breath elevated white count antiseptic consistent with pneumonia Admit to medical floor. IV vancomycin and Zosyn. Follow-up blood cultures and WBC count. Breathing treatment as needed. Continue oxygen  2. COPD with ongoing tobacco abuse. Continue oral inhalers, nebulizer Oxygen 2 L/m. Patient uses chronic home oxygen  3.Chronic pain continue tramadol and fentanyl.  4. Hyperlipidemia Continue statin  5. DVT prophylaxis Subcutaneous Lovenox  6. Leukocytosis Due to #1.  7.Tobacco abuse pt advised to quit smoking. About 3 mins spent  All the records are reviewed and case discussed with ED provider. Management plans discussed with the patient, family and they are in agreement.  CODE STATUS: full   TOTAL critical TIME TAKING CARE OF THIS PATIENT:50  minutes.    Jermarion Poffenberger M.D on 10/17/2015 at 9:16 PM  Between 7am to 6pm - Pager - [phone]  After 6pm go to [email or web address] - password [email or web address] [facility]  [facility]  Office  [phone]  CC: Primary care physician; Mila Merry, MD

## 2015-10-17 NOTE — ED Provider Notes (Signed)
[facility] Emergency Department Provider Note  ____________________________________________  Time seen: Approximately 6:14 PM  I have reviewed the triage vital signs and the nursing notes.   HISTORY  Chief Complaint Emesis    HPI Cheryl [name] is a 64 y.o. female with an extensive past medical history and recent admission and discharge about 5 days ago from [facility] for a COPD exacerbation and altered mental status.She presents today after going to her primary care doctor, Dr. Sherrie Mustache, who recommended she come to the emergency department after obtaining a CXR showing worsening pulmonary infiltrates and blood work showing a leukocytosis of 22.    The patient and her husband report she was weak when she left the hospital, but this has gradually gotten worse since she has been home.  She has a productive cough (thick sputum), decreased PO, generalized weakness and malaise, increased shortness of breath, and multiple episodes of vomiting.  Denies fever/chills, abdominal pain.   Past Medical History  Diagnosis Date  . COPD (chronic obstructive pulmonary disease) (HCC)   . Hypertension   . Arthritis   . Hyperlipidemia   . GERD (gastroesophageal reflux disease)   . Neuropathy (HCC) 2010  . Oxygen deficiency   . Coronary artery disease   . Headache   . Anxiety   . Peripheral vascular disease (HCC)   . Vitamin D deficiency   . Migraines   . Chronic pain   . DVT (deep venous thrombosis) (HCC)   . Pneumonia   . Osteoporosis   . Allergy   . Asthma     Patient Active Problem List   Diagnosis Date Noted  . Abnormal mammogram of right breast 10/11/2015  . Acute bronchitis 10/11/2015  . Dilated intrahepatic bile duct 10/11/2015  . Anemia 10/11/2015  . Abdominal pain 10/11/2015  . Acute delirium 10/10/2015  . Altered mental status 10/09/2015  . Lumbar radicular pain 09/04/2015  . Chronic low back pain 09/04/2015  . Other long  term (current) drug therapy 09/04/2015  . Encounter for long-term opiate analgesic use 09/04/2015  . Encounter for therapeutic drug level monitoring 09/04/2015  . Opiate use 09/04/2015  . Uncomplicated opioid dependence (HCC) 09/04/2015  . Chronic pain syndrome 09/04/2015  . Platelet inhibition due to Plavix 09/04/2015  . COPD (chronic obstructive pulmonary disease) (HCC) 07/31/2015  . Dementia 07/31/2015  . Gastrointestinal complaints, nonspecific 07/31/2015  . Absolute anemia 07/03/2015  . Airway hyperreactivity 07/03/2015  . Back pain, thoracic 07/03/2015  . Diaphoresis 07/03/2015  . Excessive falling 07/03/2015  . Alteration in bowel elimination: incontinence 07/03/2015  . Insomnia 07/03/2015  . Decreased testosterone level 07/03/2015  . Leg weakness 07/03/2015  . Menopausal symptom 07/03/2015  . Migraine 07/03/2015  . Neuropathy (HCC) 07/03/2015  . Fecal occult blood test positive 07/03/2015  . OP (osteoporosis) 07/03/2015  . Episodic paroxysmal anxiety disorder 07/03/2015  . Episode of syncope 07/03/2015  . Compulsive tobacco user syndrome 07/03/2015  . Urinary incontinence 07/03/2015  . Weight loss 07/03/2015  . Essential hypertension 06/06/2015  . GERD (gastroesophageal reflux disease) 06/06/2015  . Hyperlipemia 06/06/2015  . Disorder of peripheral nervous system (HCC) 04/13/2014  . 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 Anxiety 02/10/2014  . Coronary artery disease 02/10/2014  . Peripheral blood vessel disorder (HCC) 02/10/2014  . Hypercholesteremia 02/10/2014  . Peripheral vascular disease (HCC) 02/10/2014  . Vitamin D deficiency 10/16/2009    Past Surgical History  Procedure Laterality Date  . Appendectomy    . Spine  surgery    . Foot surgery Bilateral     5-6 years per patient  . Cardiac catherization  10/31/2009  . Abdomnal aortic stent  05/30/2008    Dr. Nanetta Batty  . Abdominal hysterectomy  1975    Bilaterl Oophorectomy; Dur to IUD  infection  . Cholecystectomy  1972  . Cervical fusion  C5 - 6/C6-7  . Appendectomy    . Colonoscopy with propofol N/A 07/27/2015    Procedure: COLONOSCOPY WITH PROPOFOL;  Surgeon: Wallace Cullens, MD;  Location[facility] ENDOSCOPY;  Service: Gastroenterology;  Laterality: N/A;  . Esophagogastroduodenoscopy (egd) with propofol N/A 07/27/2015    Procedure: ESOPHAGOGASTRODUODENOSCOPY (EGD) WITH PROPOFOL;  Surgeon: Wallace Cullens, MD;  Location: [facility] ENDOSCOPY;  Service: Gastroenterology;  Laterality: N/A;    Current Outpatient Rx  Name  Route  Sig  Dispense  Refill  . acetaminophen (TYLENOL) 500 MG tablet   Oral   Take 1,000 mg by mouth every 6 (six) hours as needed for mild pain or headache.          . ADVAIR DISKUS 250-50 MCG/DOSE AEPB      inhale 1 dose by mouth twice a day   60 each   11   . albuterol (PROVENTIL HFA;VENTOLIN HFA) 108 (90 BASE) MCG/ACT inhaler   Inhalation   Inhale 2 puffs into the lungs every 4 (four) hours as needed for wheezing or shortness of breath.   18 g   3   . albuterol (PROVENTIL) (2.5 MG/3ML) 0.083% nebulizer solution   Nebulization   Take 3 mLs (2.5 mg total) by nebulization every 4 (four) hours as needed for wheezing.   75 mL   12   . ALPRAZolam (XANAX) 0.5 MG tablet      take 1 tablet by mouth every 6 hours if needed Patient taking differently: take 1 tablet by mouth every 4 hours as needed for anxiety   60 tablet   5   . azithromycin (ZITHROMAX) 250 MG tablet   Oral   Take 1 tablet (250 mg total) by mouth daily.   4 each   0   . clopidogrel (PLAVIX) 75 MG tablet   Oral   Take 75 mg by mouth daily.         . diphenoxylate-atropine (LOMOTIL) 2.5-0.025 MG per tablet   Oral   Take 2 tablets by mouth 2 (two) times daily as needed for diarrhea or loose stools.          . donepezil (ARICEPT) 10 MG tablet   Oral   Take 10 mg by mouth at bedtime.         Marland Kitchen estropipate (OGEN) 0.75 MG tablet   Oral   Take 0.75 mg by mouth daily.         .  fentaNYL (DURAGESIC - DOSED MCG/HR) 50 MCG/HR   Transdermal   Place 1 patch (50 mcg total) onto the skin every 3 (three) days.   10 patch   0     Do not place this medication, or any other prescri ...   . ferrous sulfate 325 (65 FE) MG tablet   Oral   Take 325 mg by mouth daily with breakfast.         . furosemide (LASIX) 40 MG tablet   Oral   Take 40 mg by mouth daily.         . mupirocin ointment (BACTROBAN) 2 %   Topical   Apply 1 application topically 2 (two)  times daily.          . pantoprazole (PROTONIX) 40 MG tablet   Oral   Take 1 tablet (40 mg total) by mouth daily.   60 tablet   6   . pregabalin (LYRICA) 150 MG capsule   Oral   Take 1 capsule (150 mg total) by mouth 2 (two) times daily.   60 capsule   2     Do not place this medication, or any other prescri ...   . silver sulfADIAZINE (SILVADENE) 1 % cream   Topical   Apply 1 application topically daily.         . simvastatin (ZOCOR) 10 MG tablet   Oral   Take 10 mg by mouth at bedtime.          . SUMAtriptan (IMITREX) 25 MG tablet   Oral   Take 25 mg by mouth as needed for migraine. May repeat in 2 hours if headache persists or recurs.         Marland Kitchen tiotropium (SPIRIVA) 18 MCG inhalation capsule   Inhalation   Place 18 mcg into inhaler and inhale daily.         . traMADol (ULTRAM) 50 MG tablet   Oral   Take 2 tablets (100 mg total) by mouth every 6 (six) hours as needed. Patient taking differently: Take 100 mg by mouth 4 (four) times daily.    240 tablet   2     Do not place this medication, or any other prescri ...     Allergies Percocet; Aspirin; Codeine; Morphine; Pregabalin; Propoxyphene; and Sulfa antibiotics  Family History  Problem Relation Age of Onset  . Cancer Mother   . Arthritis Mother   . Heart disease Mother   . Diabetes Mother     mellitus, type 2  . Heart disease Father   . Diabetes Sister   . Cancer Brother   . Cancer Brother     lung  . Diabetes Brother      Social History Social History  Substance Use Topics  . Smoking status: Current Every Day Smoker -- 1.00 packs/day for 50 years    Types: Cigarettes  . Smokeless tobacco: None  . Alcohol Use: No    Review of Systems Constitutional: No fever/chills but with general malaise/weakness and decreased appetite Eyes: No visual changes. ENT: No sore throat. Cardiovascular: Denies chest pain. Respiratory: Clear to worsening shortness of breath with productive cough and occasional wheezing Gastrointestinal: No abdominal pain.  Multiple episodes of vomiting over the last 2 days.  No diarrhea.  No constipation. Genitourinary: Negative for dysuria. Musculoskeletal: Negative for back pain. Skin: Negative for rash. Neurological: Negative for headaches, focal weakness or numbness.  10-point ROS otherwise negative.  ____________________________________________   PHYSICAL EXAM:  VITAL SIGNS: ED Triage Vitals  Enc Vitals Group     BP 10/17/15 1733 108/49 mmHg     Pulse Rate 10/17/15 1733 85     Resp 10/17/15 1733 24     Temp 10/17/15 1733 98.8 F (37.1 C)     Temp Source 10/17/15 1733 Oral     SpO2 10/17/15 1733 94 %     Weight 10/17/15 1733 100 lb (45.36 kg)     Height 10/17/15 1733 5\' 1"  (1.549 m)     Head Cir --      Peak Flow --      Pain Score 10/17/15 1734 4     Pain Loc --  Pain Edu? --      Excl. in GC? --     Constitutional: Alert and oriented.  Thin elderly woman with the appearance of chronic illness. Eyes: Conjunctivae are normal. PERRL. EOMI. Head: Atraumatic. Nose: No congestion/rhinnorhea. Mouth/Throat: Mucous membranes are dry.  Oropharynx non-erythematous. Neck: No stridor.   Cardiovascular: Normal rate, regular rhythm. Grossly normal heart sounds.  Good peripheral circulation. Respiratory: Normal respiratory effort.  No retractions.  Coarse breath sounds in bases.  Frequent thick cough. Gastrointestinal: Soft and nontender. No distention. No abdominal  bruits. No CVA tenderness. Musculoskeletal: No lower extremity tenderness nor edema.  No joint effusions. Neurologic:  Normal speech and language. No gross focal neurologic deficits are appreciated.  Skin:  Skin is warm, dry and intact. No rash noted. Psychiatric: Mood and affect are normal. Speech and behavior are normal.  ____________________________________________   LABS (all labs ordered are listed, but only abnormal results are displayed)  Labs Reviewed  LACTIC ACID, PLASMA - Abnormal; Notable for the following:    Lactic Acid, Venous 2.4 (*)    All other components within normal limits  APTT - Abnormal; Notable for the following:    aPTT 38 (*)    All other components within normal limits  CULTURE, BLOOD (ROUTINE X 2)  CULTURE, BLOOD (ROUTINE X 2)  URINE CULTURE  CULTURE, EXPECTORATED SPUTUM-ASSESSMENT  TROPONIN I  PROTIME-INR  LACTIC ACID, PLASMA  URINALYSIS COMPLETEWITH MICROSCOPIC ([facility] ONLY)   ____________________________________________  EKG  ED ECG REPORT I, Porche Steinberger, the attending physician, personally viewed and interpreted this ECG.  Date: 10/17/2015 EKG Time: 18:49 Rate: 76 Rhythm: normal sinus rhythm QRS Axis: normal Intervals: normal ST/T Wave abnormalities: normal Conduction Disutrbances: none Narrative Interpretation: unremarkable  ____________________________________________  RADIOLOGY   Dg Chest 2 View  10/17/2015  CLINICAL DATA:  Persistent cough, COPD exacerbation, altered mental status. EXAM: CHEST  2 VIEW COMPARISON:  10/09/2015 FINDINGS: Limited rotated exam to the right. Worsening patchy nodular airspace process throughout the entire right lung and to a lesser degree in the left lower lobe. Minor streaky left base atelectasis. No large effusion or pneumothorax. Atherosclerosis of the aorta. IMPRESSION: Extensive diffuse right lung nodular airspace opacities and to a less degree in the left lower lobe compatible with pneumonia. Left  basilar atelectasis Recommend radiographic follow-up to document resolution. Electronically Signed   By: Judie PetitM.  Shick M.D.   On: 10/17/2015 14:14    ____________________________________________   PROCEDURES  Procedure(s) performed: None  Critical Care performed: Yes, see critical care note(s)   CRITICAL CARE Performed by: Loleta RoseFORBACH, Dredyn Gubbels   Total critical care time: 30 minutes  Critical care time was exclusive of separately billable procedures and treating other patients.  Critical care was necessary to treat or prevent imminent or life-threatening deterioration.  Critical care was time spent personally by me on the following activities: development of treatment plan with patient and/or surrogate as well as nursing, discussions with consultants, evaluation of patient's response to treatment, examination of patient, obtaining history from patient or surrogate, ordering and performing treatments and interventions, ordering and review of laboratory studies, ordering and review of radiographic studies, pulse oximetry and re-evaluation of patient's condition.  ____________________________________________   INITIAL IMPRESSION / ASSESSMENT AND PLAN / ED COURSE  Pertinent labs & imaging results that were available during my care of the patient were reviewed by me and considered in my medical decision making (see chart for details).  The patient meets sepsis criteria based on [place] cytosis of 22, respiratory rate  of 24, and worsening opacities consistent with pneumonia on her chest x-ray.  Given her recent hospitalization and her steadily worsening since discharge, I will treat her aggressively with the sepsis protocol including 30 mL/kg of IV fluids and empiric antibiotics for healthcare associated pneumonia.  I anticipate admission after this process has completed.  She is not in severe respiratory distress at this time although when she checked in she was hypotensive and 90% on room air.  She has  no abdominal tenderness to palpation at this time and I will give her some Zofran for the nausea and vomiting that she reports.  ____________________________________________  FINAL CLINICAL IMPRESSION(S) / ED DIAGNOSES  Final diagnoses:  Healthcare-associated pneumonia  Sepsis, due to unspecified organism [facility])      NEW MEDICATIONS STARTED DURING THIS VISIT:  New Prescriptions   No medications on file     Loleta Rose, MD 10/17/15 [id number]

## 2015-10-17 NOTE — Telephone Encounter (Signed)
Pt's husband called lookinf for her test results from the labs done this am and cxr.  His call back is [phone]  Thanks Barth Kirkseri

## 2015-10-17 NOTE — ED Notes (Signed)
Sec called sepsis call

## 2015-10-17 NOTE — ED Notes (Signed)
PHARMACY CALLED FOR ZOSYN

## 2015-10-18 ENCOUNTER — Inpatient Hospital Stay: Payer: Commercial Managed Care - HMO

## 2015-10-18 DIAGNOSIS — J189 Pneumonia, unspecified organism: Secondary | ICD-10-CM

## 2015-10-18 DIAGNOSIS — R05 Cough: Secondary | ICD-10-CM

## 2015-10-18 DIAGNOSIS — R0600 Dyspnea, unspecified: Secondary | ICD-10-CM

## 2015-10-18 DIAGNOSIS — A419 Sepsis, unspecified organism: Principal | ICD-10-CM

## 2015-10-18 LAB — CBC
HCT: 27.1 % — ABNORMAL LOW (ref 35.0–47.0)
Hemoglobin: 8.6 g/dL — ABNORMAL LOW (ref 12.0–16.0)
MCH: 30.1 pg (ref 26.0–34.0)
MCHC: 31.8 g/dL — ABNORMAL LOW (ref 32.0–36.0)
MCV: 94.7 fL (ref 80.0–100.0)
PLATELETS: 258 10*3/uL (ref 150–440)
RBC: 2.86 MIL/uL — AB (ref 3.80–5.20)
RDW: 14.5 % (ref 11.5–14.5)
WBC: 20 10*3/uL — AB (ref 3.6–11.0)

## 2015-10-18 LAB — T4: T4, Total: 8.4 ug/dL (ref 4.5–12.0)

## 2015-10-18 MED ORDER — IPRATROPIUM-ALBUTEROL 0.5-2.5 (3) MG/3ML IN SOLN: 3.0000 mL | Freq: Four times a day (QID) | RESPIRATORY_TRACT | Status: DC

## 2015-10-18 MED ORDER — IPRATROPIUM-ALBUTEROL 0.5-2.5 (3) MG/3ML IN SOLN
3.0000 mL | Freq: Four times a day (QID) | RESPIRATORY_TRACT | Status: DC
  Administered 2015-10-18 – 2015-10-19 (×4): 3 mL via RESPIRATORY_TRACT
  Filled 2015-10-18 (×4): qty 3

## 2015-10-18 MED ORDER — PREDNISONE 20 MG PO TABS
40.0000 mg | ORAL_TABLET | Freq: Every day | ORAL | Status: AC
  Administered 2015-10-19 – 2015-10-21 (×3): 40 mg via ORAL
  Filled 2015-10-18 (×3): qty 2

## 2015-10-18 MED ORDER — IPRATROPIUM-ALBUTEROL 0.5-2.5 (3) MG/3ML IN SOLN: 3.0000 mL | RESPIRATORY_TRACT | Status: DC | PRN

## 2015-10-18 MED ORDER — ACETAMINOPHEN 325 MG PO TABS
650.0000 mg | ORAL_TABLET | Freq: Four times a day (QID) | ORAL | Status: DC | PRN
  Administered 2015-10-18 – 2015-10-19 (×2): 650 mg via ORAL
  Filled 2015-10-18 (×2): qty 2

## 2015-10-18 NOTE — Evaluation (Signed)
Occupational Therapy Evaluation Patient Details Name: Sokha Craker MRN: [id number] DOB: March 02, 1951 Today's Date: 10/18/2015    History of Present Illness Jacyln Carmer is a 64 y.o. female with a known history of hypertension, COPD, PAD, CAD, dementia and chronic pain syndrome. The patient was sent to ED due to confusion upon arrival. The patient was confused and did not answer questions in the ED, but now can carry on a normal conversation. Patient is on O2, and reports being on O2 at night at home.   Clinical Impression   The patient is a 64 year old female with the above history. She lives with her husband in a one story home.  He did help her with her bath, but she was independent with other BADL and reports she drives about once per week. She does get increased shortness of breath with certain activities and would benefit from Occupational Therapy for energy conservation techniques.      Follow Up Recommendations       Equipment Recommendations       Recommendations for Other Services       Precautions / Restrictions Precautions Precautions: Fall Restrictions Weight Bearing Restrictions: No      Mobility Bed Mobility Overal bed mobility: Modified Independent (supine to sit used bed rails then had some trouble maintaining up right.)                Transfers                      Balance                                            ADL                                         General ADL Comments: Patient had been getting assist for bathing but dressed herself, but gets short of breath with dressing lower body. Today, practiced with hip kit to dress lower body. Patient Donned/doffed socks and pants to knees using reacher and sock aid with no increase of shortness of breath. Instructed patient in other energy saving techniques. Issued a list of vendors where hip kit can be obtained.     Vision     Perception      Praxis      Pertinent Vitals/Pain       Hand Dominance     Extremity/Trunk Assessment Upper Extremity Assessment Upper Extremity Assessment:  (B UE full range, strength 3+/5, poor endurance)   Lower Extremity Assessment Lower Extremity Assessment: Defer to PT evaluation       Communication Communication Communication: No difficulties   Cognition Arousal/Alertness: Awake/alert Behavior During Therapy: WFL for tasks assessed/performed Overall Cognitive Status: Within Functional Limits for tasks assessed                     General Comments       Exercises       Shoulder Instructions      Home Living Family/patient expects to be discharged to:: Private residence Living Arrangements: Spouse/significant other Available Help at Discharge: Family;Available PRN/intermittently Type of Home: House Home Access: Ramped entrance     Home Layout: One level     Bathroom Shower/Tub: Walk-in shower  Bathroom Toilet: Standard     Home Equipment: Environmental consultant - 2 wheels;Walker - 4 wheels;Cane - single point;Shower seat;Wheelchair - manual          Prior Functioning/Environment               OT Diagnosis: Generalized weakness   OT Problem List: Decreased strength;Decreased activity tolerance;Decreased knowledge of use of DME or AE   OT Treatment/Interventions: Self-care/ADL training    OT Goals(Current goals can be found in the care plan section) Acute Rehab OT Goals Patient Stated Goal: To go home OT Goal Formulation: With patient/family Time For Goal Achievement: 11/01/15 Potential to Achieve Goals: Good  OT Frequency: Min 1X/week   Barriers to D/C:            Co-evaluation              End of Session Equipment Utilized During Treatment:  (hip kit)  Activity Tolerance:   Patient left: in bed;with call bell/phone within reach;with bed alarm set;with family/visitor present   Time: 2712-9290 OT Time Calculation (min): 20 min Charges:  OT  General Charges $OT Visit: 1 Procedure OT Evaluation $Initial OT Evaluation Tier I: 1 Procedure OT Treatments $Self Care/Home Management : 8-22 mins G-Codes:    Myrene Galas, MS/OTR/L  10/18/2015, 3:35 PM

## 2015-10-18 NOTE — Clinical Social Work Note (Signed)
Clinical Social Work Assessment  Patient Details  Name: Cheryl [name] MRN: [id number] Date of Birth: 02/07/1951  Date of referral:  10/18/15               Reason for consult:  Other (Comment Required) (COPD Gold )                Permission sought to share information with:  Case Manager Permission granted to share information::  Yes, Verbal Permission Granted  Name::        Agency::     Relationship::     Contact Information:     Housing/Transportation Living arrangements for the past 2 months:  Single Family Home Source of Information:  Patient Patient Interpreter Needed:  None Criminal Activity/Legal Involvement Pertinent to Current Situation/Hospitalization:  No - Comment as needed Significant Relationships:  Adult Children, Siblings, Spouse Lives with:  Spouse Do you feel safe going back to the place where you live?  Yes Need for family participation in patient care:  Yes (Comment)  Care giving concerns:  Patient lives in [place] with her husband.    Social Worker assessment / plan:  Holiday representative (CSW) received COPD Gold consult. PT is recommending home health. CSW met with patient and her brother was at bedside. CSW introduced self and explained role of CSW department. Patient reported that she lives in [place] with her husband. Per patient she was 12 siblings and 64 are living. Patient reported that her siblings provide great support as well as her children and grandchildren. Patient reported that she has 24 hour care at home between her husband and siblings. Patient denied anxiety and depression symptoms. Patient reported that she is going home and was open to home health already. Patient requested a bedside commode. RN Case Manager aware of above. Please reconsult if future social work needs arise. CSW signing off.   Employment status:  Disabled (Comment on whether or not currently receiving Disability), Retired Nurse, adult PT  Recommendations:  Home with [place] / Referral to community resources:  Other (Comment Required) (RN Case Manager to arrange [place] )  Patient/Family's Response to care:  Patient is agreeable to going home with home health.   Patient/Family's Understanding of and Emotional Response to Diagnosis, Current Treatment, and Prognosis:  Patient was pleasant throughout assessment.   Emotional Assessment Appearance:  Appears stated age Attitude/Demeanor/Rapport:    Affect (typically observed):  Accepting, Adaptable, Pleasant Orientation:  Oriented to Self, Oriented to Place, Oriented to  Time, Oriented to Situation Alcohol / Substance use:  Not Applicable Psych involvement (Current and /or in the community):  No (Comment)  Discharge Needs  Concerns to be addressed:  Discharge Planning Concerns Readmission within the last 30 days:  No Current discharge risk:  None Barriers to Discharge:  No Barriers Identified   Loralyn Freshwater, LCSW 10/18/2015, 6:14 PM

## 2015-10-18 NOTE — Progress Notes (Signed)
Initial Nutrition Assessment   INTERVENTION:   Meals and Snacks: Cater to patient preferences; will send ice cream as bedtime snack Medical Food Supplement Therapy: pt does not like Ensure and does not want supplement at this time   NUTRITION DIAGNOSIS:   Inadequate oral intake related to poor appetite as evidenced by per patient/family report.  GOAL:   Patient will meet greater than or equal to 90% of their needs  MONITOR:    (Energy Intake, Electrolyte and renal Profile, Anthropometrics, Pulmonary Profile)  REASON FOR ASSESSMENT:   Consult COPD Protocol  ASSESSMENT:   Pt admitted with sepsis secondary to pna. Pt with h/o COPD.  Past Medical History  Diagnosis Date  . COPD (chronic obstructive pulmonary disease) (HCC)   . Hypertension   . Arthritis   . Hyperlipidemia   . GERD (gastroesophageal reflux disease)   . Neuropathy (HCC) 2010  . Oxygen deficiency   . Coronary artery disease   . Headache   . Anxiety   . Peripheral vascular disease (HCC)   . Vitamin D deficiency   . Migraines   . Chronic pain   . DVT (deep venous thrombosis) (HCC)   . Pneumonia   . Osteoporosis   . Allergy   . Asthma     Diet Order:  DIET SOFT Room service appropriate?: Yes; Fluid consistency:: Thin    Current Nutrition: Pt reports eating a banana this am at breakfast with juice. Pt reports usually not eating breakfast.   Food/Nutrition-Related History: Pt reports always eating a good dinner but eats small meals during the day when she gets hungry. Pt does not like supplements and does not drink. Pt reports liking ice cream and fruit.   Scheduled Medications:  . clopidogrel  75 mg Oral Daily  . donepezil  10 mg Oral QHS  . enoxaparin (LOVENOX) injection  40 mg Subcutaneous Q24H  . estropipate  0.75 mg Oral Daily  . fentaNYL  50 mcg Transdermal Q72H  . ferrous sulfate  325 mg Oral Daily  . mometasone-formoterol  2 puff Inhalation BID  . pantoprazole  40 mg Oral BID AC  .  piperacillin-tazobactam (ZOSYN)  IV  3.375 g Intravenous 3 times per day  . pregabalin  150 mg Oral BID  . simvastatin  10 mg Oral QHS  . tiotropium  18 mcg Inhalation Daily  . traMADol  100 mg Oral QID  . vancomycin  750 mg Intravenous Q24H    Continuous Medications:  . sodium chloride 75 mL/hr at 10/18/15 0600     Electrolyte/Renal Profile and Glucose Profile:   Recent Labs Lab 10/17/15 1142  NA 135  K 4.5  CL 100*  CO2 25  BUN 23*  CREATININE 1.01*  CALCIUM 8.7*  GLUCOSE 98   Protein Profile:  Recent Labs Lab 10/17/15 1142  ALBUMIN 3.3*    Gastrointestinal Profile: Last BM:  10/18/2015   Nutrition-Focused Physical Exam Findings:  Unable to complete Nutrition-Focused physical exam at this time.    Weight Change: Pt reports stable weight.   Skin:  Reviewed, no issues  Height:   Ht Readings from Last 1 Encounters:  10/17/15  (1.549 m)    Weight:   Wt Readings from Last 1 Encounters:  10/17/15 103 lb (46.72 kg)   Wt Readings from Last 10 Encounters:  10/17/15 103 lb (46.72 kg)  10/09/15 93 lb (42.185 kg)  09/06/15 104 lb (47.174 kg)  09/04/15 100 lb (45.36 kg)  08/17/15 100 lb (45.36 kg)  07/31/15 101 lb 4.8 oz (45.949 kg)  07/27/15 100 lb (45.36 kg)  07/03/15 100 lb (45.36 kg)  03/30/15 100 lb (45.36 kg)    BMI:  Body mass index is 19.47 kg/(m^2).  Estimated Nutritional Needs:   Kcal:  BEE: 944kcals, TEE: (IF 1.1-1.3)(AF 1.2) 1246-1473kcals  Protein:  47-56g protein (1.0-1.2g/kg)  Fluid:  1168-142401mL of fluid (25-6730mL/kg)  EDUCATION NEEDS:   No education needs identified at this time   MODERATE Care Level  Leda QuailAllyson Dayonna Selbe, RD, LDN Pager [phone]

## 2015-10-18 NOTE — Consult Note (Signed)
   [facility]   10/18/2015  [name] Apr 09, 1951 [id number]   [facility]. Please see chart review tab then notes for further [facility] correspondence with patient. Will send notification to [facility] of admission and will make inpatient RNCM aware [facility] is active.   Raiford NobleAtika Hall, MSN-Ed, RN,BSN [facility] Hospital Liaison [phone]

## 2015-10-18 NOTE — Evaluation (Signed)
Physical Therapy Evaluation Patient Details Name: Cheryl [name] MRN: [id number] DOB: [birth year] Today's Date: 10/18/2015   History of Present Illness  Cheryl [name] is a 64 y.o. female with a known history of hypertension, COPD, PAD, CAD, dementia and chronic pain syndrome. The patient was sent to ED due to confusion today. The patient WAS confused and does not answer questions in the ED. According to the patient's husband, the patient was fine until the morning of arrival. She was found confused and complaining of chest and abdominal pain. The patient had multiple episodes similar problem before without causes found, after hospitalization for several days each time. Workup including CAT scan of the head, chest x-ray, lab and urinalysis in the ED were negative.  Clinical Impression  Pt was assessed upon admission to hosp with new findings of confusion and diagnosed with sepsis and leukocytosis.  Her plan is to go home with husband who will be able to help care for her to receive HHPT and continue to outpatient if indicated.    Follow Up Recommendations Home health PT    Equipment Recommendations  None recommended by PT    Recommendations for Other Services       Precautions / Restrictions Precautions Precautions: Fall Restrictions Weight Bearing Restrictions: No      Mobility  Bed Mobility Overal bed mobility: Modified Independent Bed Mobility: Supine to Sit     Supine to sit: Min assist        Transfers Overall transfer level: Needs assistance Equipment used: 1 person hand held assist Transfers: Sit to/from [employer] Sit to Stand: Min assist Stand pivot transfers: Min assist       General transfer comment: prompted hand placement  Ambulation/Gait Ambulation/Gait assistance: Min assist;Min guard Ambulation Distance (Feet): 25 Feet Assistive device: 1 person hand held assist Gait Pattern/deviations: Step-through pattern;Shuffle;Wide base of  support;Drifts right/left;Trunk flexed Gait velocity: reduced from pain Gait velocity interpretation: Below normal speed for age/gender    Stairs            Wheelchair Mobility    Modified Rankin (Stroke Patients Only)       Balance Overall balance assessment: Needs assistance Sitting-balance support: Feet supported Sitting balance-Leahy Scale: Poor     Standing balance support: Single extremity supported Standing balance-Leahy Scale: Poor                               Pertinent Vitals/Pain Pain Assessment: Faces Faces Pain Scale: Hurts even more Pain Location: baack Pain Descriptors / Indicators: Aching Pain Intervention(s): Limited activity within patient's tolerance;Monitored during session;Repositioned    Home Living Family/patient expects to be discharged to:: Private residence Living Arrangements: Spouse/significant other Available Help at Discharge: Family;Available PRN/intermittently Type of Home: House Home Access: Ramped entrance     Home Layout: One level Home Equipment: Walker - 2 wheels;Walker - 4 wheels;Cane - single point;Shower seat;Wheelchair - manual      Prior Function Level of Independence: Needs assistance   Gait / Transfers Assistance Needed: Independent with rolling walker           Hand Dominance        Extremity/Trunk Assessment   Upper Extremity Assessment: Overall WFL for tasks assessed           Lower Extremity Assessment: Overall WFL for tasks assessed      Cervical / Trunk Assessment: Kyphotic  Communication   Communication: No difficulties  Cognition Arousal/Alertness: Awake/alert  Behavior During Therapy: WFL for tasks assessed/performed Overall Cognitive Status: Within Functional Limits for tasks assessed                      General Comments General comments (skin integrity, edema, etc.): O2 sats were stable with gait from 95% pregait to 94% post gait.    Exercises         Assessment/Plan    PT Assessment Patient needs continued PT services  PT Diagnosis Difficulty walking;Acute pain   PT Problem List Decreased strength;Decreased range of motion;Decreased activity tolerance;Decreased balance;Decreased mobility;Decreased coordination;Decreased knowledge of use of DME;Decreased safety awareness;Decreased knowledge of precautions;Cardiopulmonary status limiting activity;Pain  PT Treatment Interventions DME instruction;Gait training;Stair training;Functional mobility training;Therapeutic activities;Therapeutic exercise;Balance training;Neuromuscular re-education;Patient/family education   PT Goals (Current goals can be found in the Care Plan section) Acute Rehab PT Goals Patient Stated Goal: To go home PT Goal Formulation: With patient Time For Goal Achievement: 11/01/15 Potential to Achieve Goals: Good    Frequency Min 2X/week   Barriers to discharge Decreased caregiver support      Co-evaluation               End of Session Equipment Utilized During Treatment: Gait belt Activity Tolerance: Patient tolerated treatment well;Patient limited by fatigue;Patient limited by pain;Patient limited by lethargy Patient left: in bed;with call bell/phone within reach;with bed alarm set;with family/visitor present Nurse Communication: Mobility status         Time: 1131-1158 PT Time Calculation (min) (ACUTE ONLY): 27 min   Charges:   PT Evaluation $Initial PT Evaluation Tier I: 1 Procedure PT Treatments $Gait Training: 8-22 mins   PT G Codes:        Ivar DrapeStout, Kiren Mcisaac E 10/18/2015, 6:10 PM   Samul Dadauth Kaydon Creedon, PT MS Acute Rehab Dept. Number: [facility] [phone] and [facility] [phone]

## 2015-10-18 NOTE — Care Management Note (Addendum)
Case Management Note  Patient Details  Name: Cheryl [name] MRN: [id number] Date of Birth: 10/23/1951  Subjective/Objective:                  Met with patient and her husband Cheryl [name] to discuss discharge planning. She has had 3 visits for respiratory symptoms over last 6 months. She lives with her husband. She states she "is weaker and hasn't been able to ambulate recently" she relates to her respiratory illness. She typically uses a walker to ambulate. She is open to [facility] per last visit. Her PCP is Dr. Lelon Huh.She gets her Rx from [facility] in [place]- she denies difficulty obtaining Rx or transportation to [place]. She states she has home O2 through [place]. It must be PRN because she is not currently wearing O2.  Action/Plan: [facility] notified of patient admission. COPD Gold packet delivered by RN. O2 is not through [facility] but they did deliver a nebulizer and a walker. Not [employer].  Per Jeneen Rinks with [employer] has the O2 account. She is ordered to wear O2 continuously. Follow up appointment made with Dr. Lelon Huh for Dec. 8, 2016 at 10AM and placed on discharge instructions.   Expected Discharge Date:  10/19/15               Expected Discharge Plan:     In-House Referral:     Discharge planning Services  CM Consult (COPD Gold)  Post Acute Care Choice:  Home Health Choice offered to:  Patient, Spouse  DME Arranged:    DME Agency:     HH Arranged:  RN, PT [place] Agency:  [place]  Status of Service:  In process, will continue to follow  Medicare Important Message Given:    Date Medicare IM Given:    Medicare IM give by:    Date Additional Medicare IM Given:    Additional Medicare Important Message give by:     If discussed at [place] of Stay Meetings, dates discussed:    Additional Comments:  Marshell Garfinkel, RN 10/18/2015, 10:59 AM

## 2015-10-18 NOTE — Progress Notes (Signed)
Spoke with Dr.Shah regarding pt's heart rate of 132, temp of 100.6. MD to place order for tylenol.  If temp >101 repeat blood culture x2 - pending

## 2015-10-18 NOTE — Consult Note (Signed)
PULMONARY / CRITICAL CARE MEDICINE   Name: Cheryl [name] MRN: [id number] DOB: 29-May-1951    ADMISSION DATE:  10/17/2015 CONSULTATION DATE:  10/19/15  REFERRING MD :  Dr. Delfino Lovett   CHIEF COMPLAINT:     Cough, vomiting  Reason for consult - COPD GOLD  HISTORY OF PRESENT ILLNESS    64 y.o. female with a known history of COPD with ongoing tobacco abuse, GERD, hyperlipidemia, chronic pain comes to the emergency room accompanied by her husband with ongoing shortness of breath weakness and difficulty getting around at home on 10/17/15.Patient was recently admitted with COPD exacerbation was discharged with a Z-Pak completed it. She started having intractable vomiting yesterday per husband. She was seen today in Dr. Theodis Aguas office and routine lab work showed white count of 22,000. Chest x-ray shows extensive pneumonia right sided. She was asked to come to the emergency room. The ER she received IV Vancomycin and Zosyn. She was found to be hypotensive with blood pressure systolic in the 80s admitted white count chest exercise suggestive of pneumonia. Patient is being admitted with sepsis secondary to Health care acquired Pneumonia   SIGNIFICANT EVENTS  11/17>admission for bronchitis, AMS, right breast mass    PAST MEDICAL HISTORY    :  Past Medical History  Diagnosis Date  . COPD (chronic obstructive pulmonary disease) (HCC)   . Hypertension   . Arthritis   . Hyperlipidemia   . GERD (gastroesophageal reflux disease)   . Neuropathy (HCC) 2010  . Oxygen deficiency   . Coronary artery disease   . Headache   . Anxiety   . Peripheral vascular disease (HCC)   . Vitamin D deficiency   . Migraines   . Chronic pain   . DVT (deep venous thrombosis) (HCC)   . Pneumonia   . Osteoporosis   . Allergy   . Asthma    Past Surgical History  Procedure Laterality Date  . Appendectomy    . Spine surgery    . Foot surgery Bilateral     5-6 years per patient  . Cardiac  catherization  10/31/2009  . Abdomnal aortic stent  05/30/2008    Dr. Nanetta Batty  . Abdominal hysterectomy  1975    Bilaterl Oophorectomy; Dur to IUD infection  . Cholecystectomy  1972  . Cervical fusion  C5 - 6/C6-7  . Appendectomy    . Colonoscopy with propofol N/A 07/27/2015    Procedure: COLONOSCOPY WITH PROPOFOL;  Surgeon: Wallace Cullens, MD;  Location: [facility] ENDOSCOPY;  Service: Gastroenterology;  Laterality: N/A;  . Esophagogastroduodenoscopy (egd) with propofol N/A 07/27/2015    Procedure: ESOPHAGOGASTRODUODENOSCOPY (EGD) WITH PROPOFOL;  Surgeon: Wallace Cullens, MD;  Location: [facility] ENDOSCOPY;  Service: Gastroenterology;  Laterality: N/A;   Prior to Admission medications   Medication Sig Start Date End Date Taking? Authorizing Provider  acetaminophen (TYLENOL) 500 MG tablet Take 1,000 mg by mouth every 6 (six) hours as needed for mild pain or headache.    Yes Historical Provider, MD  albuterol (PROVENTIL HFA;VENTOLIN HFA) 108 (90 BASE) MCG/ACT inhaler Inhale 2 puffs into the lungs every 4 (four) hours as needed for wheezing or shortness of breath. 08/11/15  Yes Malva Limes, MD  albuterol (PROVENTIL) (2.5 MG/3ML) 0.083% nebulizer solution Take 3 mLs (2.5 mg total) by nebulization every 4 (four) hours as needed for wheezing. 10/12/15  Yes Katharina Caper, MD  ALPRAZolam Prudy Feeler) 0.5 MG tablet Take 0.5 mg by mouth every 6 (six) hours as needed for anxiety.  Yes Historical Provider, MD  celecoxib (CELEBREX) 200 MG capsule Take 200 mg by mouth daily.   Yes Historical Provider, MD  clopidogrel (PLAVIX) 75 MG tablet Take 75 mg by mouth daily.   Yes Historical Provider, MD  diphenoxylate-atropine (LOMOTIL) 2.5-0.025 MG per tablet Take 2 tablets by mouth 2 (two) times daily as needed for diarrhea or loose stools.    Yes Historical Provider, MD  donepezil (ARICEPT) 10 MG tablet Take 10 mg by mouth at bedtime.   Yes Historical Provider, MD  estropipate (OGEN) 0.75 MG tablet Take 0.75 mg by mouth daily.    Yes Historical Provider, MD  fentaNYL (DURAGESIC - DOSED MCG/HR) 50 MCG/HR Place 1 patch (50 mcg total) onto the skin every 3 (three) days. 09/04/15  Yes Delano Metz, MD  ferrous sulfate 325 (65 FE) MG tablet Take 325 mg by mouth daily.    Yes Historical Provider, MD  Fluticasone-Salmeterol (ADVAIR) 250-50 MCG/DOSE AEPB Inhale 1 puff into the lungs 2 (two) times daily.   Yes Historical Provider, MD  furosemide (LASIX) 40 MG tablet Take 40 mg by mouth daily.   Yes Historical Provider, MD  mupirocin cream (BACTROBAN) 2 % Apply 1 application topically 3 (three) times daily.   Yes Historical Provider, MD  pantoprazole (PROTONIX) 40 MG tablet Take 40 mg by mouth 2 (two) times daily.   Yes Historical Provider, MD  pregabalin (LYRICA) 150 MG capsule Take 1 capsule (150 mg total) by mouth 2 (two) times daily. 09/04/15  Yes Delano Metz, MD  silver sulfADIAZINE (SILVADENE) 1 % cream Apply 1 application topically daily as needed (for redness on legs).    Yes Historical Provider, MD  simvastatin (ZOCOR) 10 MG tablet Take 10 mg by mouth at bedtime.    Yes Historical Provider, MD  SUMAtriptan (IMITREX) 25 MG tablet Take 25 mg by mouth as needed for migraine. May repeat in 2 hours if headache persists or recurs.   Yes Historical Provider, MD  tiotropium (SPIRIVA) 18 MCG inhalation capsule Place 18 mcg into inhaler and inhale daily.   Yes Historical Provider, MD  traMADol (ULTRAM) 50 MG tablet Take 100 mg by mouth 4 (four) times daily.   Yes Historical Provider, MD  azithromycin (ZITHROMAX) 250 MG tablet Take 1 tablet (250 mg total) by mouth daily. Patient not taking: Reported on 10/17/2015 10/11/15   Katharina Caper, MD   Allergies  Allergen Reactions  . Percocet [Oxycodone-Acetaminophen] Hives  . Aspirin Nausea And Vomiting and Other (See Comments)    Reaction:  GI upset   . Codeine Nausea And Vomiting and Other (See Comments)    Reaction:  GI upset   . Morphine Other (See Comments)    Reaction:   Unknown   . Pregabalin Other (See Comments)    Reaction:  Makes pt sleep excessively.    . Propoxyphene Other (See Comments)    Reaction:  GI upset   . Sulfa Antibiotics Rash and Other (See Comments)    Reaction:  GI upset      FAMILY HISTORY   Family History  Problem Relation Age of Onset  . Cancer Mother   . Arthritis Mother   . Heart disease Mother   . Diabetes Mother     mellitus, type 2  . Heart disease Father   . Diabetes Sister   . Cancer Brother   . Cancer Brother     lung  . Diabetes Brother       SOCIAL HISTORY    reports that  she has been smoking Cigarettes.  She has a 50 pack-year smoking history. She does not have any smokeless tobacco history on file. She reports that she does not drink alcohol or use illicit drugs.  Review of Systems  Constitutional: Negative for fever and chills.  HENT: Negative for hearing loss.   Eyes: Negative for blurred vision and double vision.  Respiratory: Positive for cough, sputum production and shortness of breath.   Gastrointestinal: Negative for heartburn, nausea, vomiting and abdominal pain.  Genitourinary: Negative for dysuria.  Musculoskeletal: Negative for myalgias.  Skin: Negative for itching and rash.  Neurological: Positive for weakness. Negative for headaches.  Endo/Heme/Allergies: Does not bruise/bleed easily.  Psychiatric/Behavioral: Negative for depression.      VITAL SIGNS    Temp:  [98.3 F (36.8 C)-100.6 F (38.1 C)] 100.6 F (38.1 C) (11/23 1546) Pulse Rate:  [67-132] 91 (11/23 1607) Resp:  [16-24] 22 (11/23 1546) BP: (99-116)/(36-86) 116/48 mmHg (11/23 1546) SpO2:  [90 %-100 %] 95 % (11/23 1607) Weight:  [100 lb (45.36 kg)-103 lb (46.72 kg)] 103 lb (46.72 kg) (11/22 2233) HEMODYNAMICS:   VENTILATOR SETTINGS:   INTAKE / OUTPUT:  Intake/Output Summary (Last 24 hours) at 10/18/15 1613 Last data filed at 10/18/15 1258  Gross per 24 hour  Intake   1366 ml  Output   1150 ml  Net    216 ml        PHYSICAL EXAM   Physical Exam  HENT:  Head: Normocephalic and atraumatic.  Right Ear: External ear normal.  Left Ear: External ear normal.  Eyes: Conjunctivae and EOM are normal. Pupils are equal, round, and reactive to light.  Neck: Normal range of motion. Neck supple.  Cardiovascular: Normal rate, regular rhythm, normal heart sounds and intact distal pulses.   Pulmonary/Chest: No respiratory distress. She has no wheezes. She has no rales. She exhibits no tenderness.  Dec BS at the bases  Abdominal: Soft. Bowel sounds are normal.  Musculoskeletal: Normal range of motion.  Neurological: She is alert.  Somnolent, easily awaken  Skin: Skin is warm and dry.       LABS   LABS:  CBC  Recent Labs Lab 10/17/15 1142 10/18/15 0603  WBC 22.2* 20.0*  HGB 10.5* 8.6*  HCT 32.0* 27.1*  PLT 327 258   Coag's  Recent Labs Lab 10/17/15 1813  APTT 38*  INR 1.08   BMET  Recent Labs Lab 10/17/15 1142  NA 135  K 4.5  CL 100*  CO2 25  BUN 23*  CREATININE 1.01*  GLUCOSE 98   Electrolytes  Recent Labs Lab 10/17/15 1142  CALCIUM 8.7*   Sepsis Markers  Recent Labs Lab 10/17/15 1813 10/17/15 2034  LATICACIDVEN 2.4* 2.0   ABG No results for input(s): PHART, PCO2ART, PO2ART in the last 168 hours. Liver Enzymes  Recent Labs Lab 10/17/15 1142  AST 15  ALT 8*  ALKPHOS 74  BILITOT 0.5  ALBUMIN 3.3*   Cardiac Enzymes  Recent Labs Lab 10/17/15 1813  TROPONINI <0.03   Glucose No results for input(s): GLUCAP in the last 168 hours.   Recent Results (from the past 240 hour(s))  Blood Culture (routine x 2)     Status: None (Preliminary result)   Collection Time: 10/17/15  6:13 PM  Result Value Ref Range Status   Specimen Description BLOOD  RIGHT, ARM  Final   Special Requests   Final    BOTTLES DRAWN AEROBIC AND ANAEROBIC  7CC AERO, 6CC ANAERO  Culture NO GROWTH < 24 HOURS  Final   Report Status PENDING  Incomplete  Blood Culture (routine  x 2)     Status: None (Preliminary result)   Collection Time: 10/17/15  6:15 PM  Result Value Ref Range Status   Specimen Description BLOOD LEFT ARM  Final   Special Requests   Final    BOTTLES DRAWN AEROBIC AND ANAEROBIC  3CC AERO, 4CC ANAERO   Culture NO GROWTH < 24 HOURS  Final   Report Status PENDING  Incomplete  Urine culture     Status: None (Preliminary result)   Collection Time: 10/17/15  6:55 PM  Result Value Ref Range Status   Specimen Description URINE, RANDOM  Final   Special Requests NONE  Final   Culture NO GROWTH < 24 HOURS  Final   Report Status PENDING  Incomplete  Culture, expectorated sputum-assessment     Status: None   Collection Time: 10/17/15  7:42 PM  Result Value Ref Range Status   Specimen Description SPU  Final   Special Requests Normal  Final   Sputum evaluation THIS SPECIMEN IS ACCEPTABLE FOR SPUTUM CULTURE  Final   Report Status 10/17/2015 FINAL  Final  Culture, respiratory (NON-Expectorated)     Status: None (Preliminary result)   Collection Time: 10/17/15  7:42 PM  Result Value Ref Range Status   Specimen Description SPU  Final   Special Requests Normal Reflexed from [id number]  Final   Gram Stain PENDING  Incomplete   Culture   Final    LIGHT GROWTH GRAM NEGATIVE RODS IDENTIFICATION AND SUSCEPTIBILITIES TO FOLLOW    Report Status PENDING  Incomplete     Current facility-administered medications:  .  0.9 %  sodium chloride infusion, , Intravenous, Continuous, Enedina Finner, MD, Last Rate: 75 mL/hr at 10/18/15 0600 .  acetaminophen (TYLENOL) tablet 650 mg, 650 mg, Oral, Q6H PRN, Delfino Lovett, MD, 650 mg at 10/18/15 1601 .  albuterol (PROVENTIL) (2.5 MG/3ML) 0.083% nebulizer solution 2.5 mg, 2.5 mg, Nebulization, Q4H PRN, Enedina Finner, MD .  ALPRAZolam Prudy Feeler) tablet 0.5 mg, 0.5 mg, Oral, Q6H PRN, Enedina Finner, MD .  clopidogrel (PLAVIX) tablet 75 mg, 75 mg, Oral, Daily, Enedina Finner, MD, 75 mg at 10/18/15 0921 .  diphenoxylate-atropine (LOMOTIL) 2.5-0.025 MG  per tablet 2 tablet, 2 tablet, Oral, BID PRN, Enedina Finner, MD .  donepezil (ARICEPT) tablet 10 mg, 10 mg, Oral, QHS, Enedina Finner, MD, 10 mg at 10/18/15 0017 .  enoxaparin (LOVENOX) injection 40 mg, 40 mg, Subcutaneous, Q24H, Enedina Finner, MD, 40 mg at 10/17/15 2321 .  estropipate (OGEN) tablet 0.75 mg, 0.75 mg, Oral, Daily, Enedina Finner, MD, 0.75 mg at 10/18/15 4098 .  fentaNYL (DURAGESIC - dosed mcg/hr) 50 mcg, 50 mcg, Transdermal, Q72H, Enedina Finner, MD, 50 mcg at 10/17/15 2321 .  ferrous sulfate tablet 325 mg, 325 mg, Oral, Daily, Enedina Finner, MD, 325 mg at 10/18/15 0921 .  mometasone-formoterol (DULERA) 100-5 MCG/ACT inhaler 2 puff, 2 puff, Inhalation, BID, Enedina Finner, MD, 2 puff at 10/18/15 0800 .  ondansetron (ZOFRAN) tablet 4 mg, 4 mg, Oral, Q6H PRN **OR** ondansetron (ZOFRAN) injection 4 mg, 4 mg, Intravenous, Q6H PRN, Enedina Finner, MD .  pantoprazole (PROTONIX) EC tablet 40 mg, 40 mg, Oral, BID AC, Enedina Finner, MD, 40 mg at 10/18/15 0800 .  piperacillin-tazobactam (ZOSYN) IVPB 3.375 g, 3.375 g, Intravenous, 3 times per day, Delfino Lovett, MD, 3.375 g at 10/18/15 1108 .  pregabalin (LYRICA) capsule 150 mg, 150 mg,  Oral, BID, Enedina Finner, MD, 150 mg at 10/18/15 0921 .  simvastatin (ZOCOR) tablet 10 mg, 10 mg, Oral, QHS, Enedina Finner, MD, 10 mg at 10/17/15 2321 .  tiotropium (SPIRIVA) inhalation capsule 18 mcg, 18 mcg, Inhalation, Daily, Enedina Finner, MD, 18 mcg at 10/18/15 0802 .  traMADol (ULTRAM) tablet 100 mg, 100 mg, Oral, QID, Enedina Finner, MD, 100 mg at 10/18/15 1315 .  vancomycin (VANCOCIN) IVPB 750 mg/150 ml premix, 750 mg, Intravenous, Q24H, Crystal G Scarpena, RPH, 750 mg at 10/18/15 1602  IMAGING    Ct Chest Wo Contrast  10/18/2015  CLINICAL DATA:  female with a known history of COPD with ongoing tobacco abuse, GERD, hyperlipidemia, chronic pain comes to the emergency room accompanied by her husband with ongoing shortness of breath weakness and difficulty getting around at home. Recent admission for  COPD exacerbation. EXAM: CT CHEST WITHOUT CONTRAST TECHNIQUE: Multidetector CT imaging of the chest was performed following the standard protocol without IV contrast. COMPARISON:  Chest radiograph, 10/17/2015. FINDINGS: Neck base and axilla: No mass or adenopathy. Visualized thyroid is unremarkable. Mediastinum and hila: Heart mildly enlarged. Minor coronary artery calcifications. Multiple prominent to borderline enlarged lymph nodes. Largest node is a right subcarinal node measuring 14 mm in short axis. No mediastinal masses. No hilar masses or discrete enlarged lymph nodes. Lungs and pleura: Minimal effusions. Multiple areas of ground-glass 2 more dense consolidation are noted in the lungs, affecting all lobes, but predominantly on the right, particularly in the right lower lobe. There is mild interstitial thickening most evident in the right lung. No lung mass or discrete nodule. No pneumothorax. Limited upper abdomen: Small hiatal hernia. Otherwise unremarkable. 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 No osteoblastic or osteolytic lesions. IMPRESSION: 1. Multiple areas of ground-glass and more confluent airspace opacity throughout the lungs, predominantly on the right. This is consistent with multifocal pneumonia. Electronically Signed   By: Amie Portland M.D.   On: 10/18/2015 09:08      Indwelling Urinary Catheter continued, requirement due to   Reason to continue Indwelling Urinary Catheter for strict Intake/Output monitoring for hemodynamic instability   Central Line continued, requirement due to   Reason to continue [employer] Monitoring of central venous pressure or other hemodynamic parameters   Ventilator continued, requirement due to, resp failure    Ventilator Sedation RASS 0 to -2   Cultures: BCx2  UC  Sputum  Antibiotics: Zosyn Vanc  Lines:   ASSESSMENT/PLAN  64 yo female with COPD, tobacco abuse, GERD, HLD,  chronic pain, seen in consultation for possible COPD, Pneumonia   Right lower lobe pneumonia - possible HCAP - cont with current antibiotics (vanc and zosyn).  - obtain sputum culture - concern for possible mirco aspiration given the location of the ground glass opacities - recommend barium swallow and speech evaluation  - polypharmacy (pain meds and psychotropic meds) could be an etiology for decrease motility, mentation, making her high risk for aspiration - sputum culture  COPD  - stop dulera and spiriva - start pulmicort BID x 3 days, then resume dulera - start duoneb scheduled q6 hrs x 3 days, then cont with prn 4 hrs for sob\wheezing - prednisone 40mg  x 3 days, then taper over 2 weeks.  - resume home meds closer to discharge.  - incentive spirometry  Tobacco abuse - patient counseled on tobacco cessation   I have personally obtained a history, examined the patient, evaluated laboratory and imaging results,  formulated the assessment and plan and placed orders.  Pulmonary consult time - 45 minutes   Stephanie AcreVishal Anida Deol, MD [facility] Pager [phone] (please enter 7-digits) On Call Pager - [phone] (please enter 7-digits)     10/18/2015, 4:13 PM  Note: This note was prepared with Dragon dictation along with smaller phrase technology. Any transcriptional errors that result from this process are unintentional.

## 2015-10-18 NOTE — Progress Notes (Signed)
   10/18/15 1020  Clinical Encounter Type  Visited With Patient and family together  Visit Type Initial  Referral From Nurse  Consult/Referral To Chaplain  Stress Factors  Patient Stress Factors Health changes  Chaplain visited with patient and husband. I offered pastoral care. Patient appeared tired and I offered to follow up at a later time.   Chaplain Jakeia Carreras [phone]

## 2015-10-18 NOTE — Progress Notes (Signed)
ANTIBIOTIC CONSULT NOTE - INITIAL  Pharmacy Consult for Vancomycin and Zosyn Indication: pneumonia  Allergies  Allergen Reactions  . Percocet [Oxycodone-Acetaminophen] Hives  . Aspirin Nausea And Vomiting and Other (See Comments)    Reaction:  GI upset   . Codeine Nausea And Vomiting and Other (See Comments)    Reaction:  GI upset   . Morphine Other (See Comments)    Reaction:  Unknown   . Pregabalin Other (See Comments)    Reaction:  Makes pt sleep excessively.    . Propoxyphene Other (See Comments)    Reaction:  GI upset   . Sulfa Antibiotics Rash and Other (See Comments)    Reaction:  GI upset     Patient Measurements: Height: 5\' 1"  (154.9 cm) Weight: 103 lb (46.72 kg) IBW/kg (Calculated) : 47.8  Vital Signs: Temp: 99.1 F (37.3 C) (11/23 0929) Temp Source: Oral (11/23 0929) BP: 107/45 mmHg (11/23 0929) Pulse Rate: 67 (11/23 0929) Intake/Output from previous day: 11/22 0701 - 11/23 0700 In: 790 [P.O.:240; I.V.:500; IV Piggyback:50] Out: 750 [Urine:750] Intake/Output from this shift: Total I/O In: 576 [I.V.:576] Out: 400 [Urine:400]  Labs:  Recent Labs  10/17/15 1142 10/18/15 0603  WBC 22.2* 20.0*  HGB 10.5* 8.6*  PLT 327 258  CREATININE 1.01*  --    Estimated Creatinine Clearance: 40.4 mL/min (by C-G formula based on Cr of 1.01). No results for input(s): VANCOTROUGH, VANCOPEAK, VANCORANDOM, GENTTROUGH, GENTPEAK, GENTRANDOM, TOBRATROUGH, TOBRAPEAK, TOBRARND, AMIKACINPEAK, AMIKACINTROU, AMIKACIN in the last 72 hours.   Microbiology: Recent Results (from the past 720 hour(s))  Blood Culture (routine x 2)     Status: None (Preliminary result)   Collection Time: 10/17/15  6:13 PM  Result Value Ref Range Status   Specimen Description BLOOD  RIGHT, ARM  Final   Special Requests   Final    BOTTLES DRAWN AEROBIC AND ANAEROBIC  7CC AERO, 6CC ANAERO   Culture NO GROWTH < 24 HOURS  Final   Report Status PENDING  Incomplete  Blood Culture (routine x 2)      Status: None (Preliminary result)   Collection Time: 10/17/15  6:15 PM  Result Value Ref Range Status   Specimen Description BLOOD LEFT ARM  Final   Special Requests   Final    BOTTLES DRAWN AEROBIC AND ANAEROBIC  3CC AERO, 4CC ANAERO   Culture NO GROWTH < 24 HOURS  Final   Report Status PENDING  Incomplete  Urine culture     Status: None (Preliminary result)   Collection Time: 10/17/15  6:55 PM  Result Value Ref Range Status   Specimen Description URINE, RANDOM  Final   Special Requests NONE  Final   Culture NO GROWTH < 24 HOURS  Final   Report Status PENDING  Incomplete  Culture, expectorated sputum-assessment     Status: None   Collection Time: 10/17/15  7:42 PM  Result Value Ref Range Status   Specimen Description SPU  Final   Special Requests Normal  Final   Sputum evaluation THIS SPECIMEN IS ACCEPTABLE FOR SPUTUM CULTURE  Final   Report Status 10/17/2015 FINAL  Final  Culture, respiratory (NON-Expectorated)     Status: None (Preliminary result)   Collection Time: 10/17/15  7:42 PM  Result Value Ref Range Status   Specimen Description SPU  Final   Special Requests Normal Reflexed from [id number]  Final   Gram Stain PENDING  Incomplete   Culture   Final    LIGHT GROWTH GRAM NEGATIVE RODS IDENTIFICATION  AND SUSCEPTIBILITIES TO FOLLOW    Report Status PENDING  Incomplete    Medical History: Past Medical History  Diagnosis Date  . COPD (chronic obstructive pulmonary disease) (HCC)   . Hypertension   . Arthritis   . Hyperlipidemia   . GERD (gastroesophageal reflux disease)   . Neuropathy (HCC) 2010  . Oxygen deficiency   . Coronary artery disease   . Headache   . Anxiety   . Peripheral vascular disease (HCC)   . Vitamin D deficiency   . Migraines   . Chronic pain   . DVT (deep venous thrombosis) (HCC)   . Pneumonia   . Osteoporosis   . Allergy   . Asthma     Medications:  Scheduled:  . clopidogrel  75 mg Oral Daily  . donepezil  10 mg Oral QHS  . enoxaparin  (LOVENOX) injection  40 mg Subcutaneous Q24H  . estropipate  0.75 mg Oral Daily  . fentaNYL  50 mcg Transdermal Q72H  . ferrous sulfate  325 mg Oral Daily  . mometasone-formoterol  2 puff Inhalation BID  . pantoprazole  40 mg Oral BID AC  . piperacillin-tazobactam (ZOSYN)  IV  3.375 g Intravenous 3 times per day  . pregabalin  150 mg Oral BID  . simvastatin  10 mg Oral QHS  . tiotropium  18 mcg Inhalation Daily  . traMADol  100 mg Oral QID  . vancomycin  750 mg Intravenous Q24H   Assessment: Patient is a 64 yo female presenting to ED for pneumonia.  Patient is on day 2 of Vancomycin and Zosyn.   Goal of Therapy:  Vancomycin trough level 15-20 mcg/ml  Plan:  Will continue patient on Vancomycin  IV Q24hr for goal trough of 15-20. Will obtain trough prior to 4th dose on 11/25 at 1600.    Will continue patient on Zosyn EI 3.375g IV Q8hr.    Pharmacy will continue to monitor and adjust per consult.  Kit Mollett L 10/18/2015,2:08 PM

## 2015-10-18 NOTE — Progress Notes (Addendum)
[facility] - [place] at [facility]   PATIENT NAME: Cheryl [name]    MR#:  [id number]  DATE OF BIRTH:  03-Aug-1951  SUBJECTIVE:  CHIEF COMPLAINT:   Chief Complaint  Patient presents with  . Emesis  feels somewhat better but still short of breath and coughing  REVIEW OF SYSTEMS:  Review of Systems  Constitutional: Positive for weight loss and malaise/fatigue. Negative for fever and diaphoresis.  HENT: Negative for ear discharge, ear pain, hearing loss, nosebleeds, sore throat and tinnitus.   Eyes: Negative for blurred vision and pain.  Respiratory: Positive for cough and shortness of breath. Negative for hemoptysis and wheezing.   Cardiovascular: Negative for chest pain, palpitations, orthopnea and leg swelling.  Gastrointestinal: Negative for heartburn, nausea, vomiting, abdominal pain, diarrhea, constipation and blood in stool.  Genitourinary: Negative for dysuria, urgency and frequency.  Musculoskeletal: Negative for myalgias and back pain.  Skin: Negative for itching and rash.  Neurological: Positive for weakness. Negative for dizziness, tingling, tremors, focal weakness, seizures and headaches.  Psychiatric/Behavioral: Negative for depression. The patient is not nervous/anxious.     DRUG ALLERGIES:   Allergies  Allergen Reactions  . Percocet [Oxycodone-Acetaminophen] Hives  . Aspirin Nausea And Vomiting and Other (See Comments)    Reaction:  GI upset   . Codeine Nausea And Vomiting and Other (See Comments)    Reaction:  GI upset   . Morphine Other (See Comments)    Reaction:  Unknown   . Pregabalin Other (See Comments)    Reaction:  Makes pt sleep excessively.    . Propoxyphene Other (See Comments)    Reaction:  GI upset   . Sulfa Antibiotics Rash and Other (See Comments)    Reaction:  GI upset    VITALS:  Blood pressure 100/45, pulse 75, temperature 98.3 F (36.8 C), temperature source Oral, resp. rate 20, height  (1.549 m), weight 46.72  kg (103 lb), SpO2 90 %. PHYSICAL EXAMINATION:  Physical Exam  Constitutional: She is oriented to person, place, and time. Vital signs are normal. She appears malnourished. She appears unhealthy. She appears cachectic. She has a sickly appearance.  HENT:  Head: Normocephalic and atraumatic.  Eyes: Conjunctivae and EOM are normal. Pupils are equal, round, and reactive to light.  Neck: Normal range of motion. Neck supple. No tracheal deviation present. No thyromegaly present.  Cardiovascular: Normal rate, regular rhythm and normal heart sounds.   Pulmonary/Chest: Effort normal. No respiratory distress. She has no wheezes. She has rhonchi in the right lower field. She exhibits no tenderness.  Abdominal: Soft. Bowel sounds are normal. She exhibits no distension. There is no tenderness.  Musculoskeletal: Normal range of motion.  Neurological: She is alert and oriented to person, place, and time. No cranial nerve deficit.  Skin: Skin is warm and dry. No rash noted.  Psychiatric: Mood and affect normal.   LABORATORY PANEL:   CBC  Recent Labs Lab 10/18/15 0603  WBC 20.0*  HGB 8.6*  HCT 27.1*  PLT 258   ------------------------------------------------------------------------------------------------------------------ Chemistries   Recent Labs Lab 10/17/15 1142  NA 135  K 4.5  CL 100*  CO2 25  GLUCOSE 98  BUN 23*  CREATININE 1.01*  CALCIUM 8.7*  AST 15  ALT 8*  ALKPHOS 74  BILITOT 0.5   RADIOLOGY:  Dg Chest 2 View  10/17/2015  CLINICAL DATA:  Persistent cough, COPD exacerbation, altered mental status. EXAM: CHEST  2 VIEW COMPARISON:  10/09/2015 FINDINGS: Limited rotated exam to the  right. Worsening patchy nodular airspace process throughout the entire right lung and to a lesser degree in the left lower lobe. Minor streaky left base atelectasis. No large effusion or pneumothorax. Atherosclerosis of the aorta. IMPRESSION: Extensive diffuse right lung nodular airspace opacities and  to a less degree in the left lower lobe compatible with pneumonia. Left basilar atelectasis Recommend radiographic follow-up to document resolution. Electronically Signed   By: Judie PetitM.  Shick M.D.   On: 10/17/2015 14:14   ASSESSMENT AND PLAN:  Cheryl [name] is a 64 y.o. female with a known history of COPD with ongoing tobacco abuse, GERD, hyperlipidemia, chronic pain comes to the emergency room accompanied by her husband with ongoing shortness of breath weakness and difficulty getting around at home.  1.Sepsis secondary to right upper lobe pneumonia. There could be a component of aspiration. Patient presented with hypotension, shortness of breath elevated white count - consistent with pneumonia Continue IV vancomycin and Zosyn. monitor blood cultures and WBC count. Breathing treatment as needed. Continue oxygen - we will obtain CT chest and pulmonary consult  2. COPD with ongoing tobacco abuse. Continue oral inhalers, nebulizer Oxygen 2 L/m. Patient uses chronic home oxygen Initiate COPD Gold protocol due to multiple readmissions  3.Chronic pain continue tramadol and fentanyl.  4. Hyperlipidemia Continue statin  5. DVT prophylaxis Subcutaneous Lovenox  6. Leukocytosis Due to #1.  7.Tobacco abuse pt advised to quit smoking. About 3 mins spent    All the records are reviewed and case discussed with Care Management/Social Worker. Management plans discussed with the patient, family and they are in agreement.  CODE STATUS: full code  TOTAL TIME TAKING CARE OF THIS PATIENT: 35 minutes.   More than 50% of the time was spent in counseling/coordination of care: YES  POSSIBLE D/C IN 2-3 DAYS, DEPENDING ON CLINICAL CONDITION.   [facility], Cina Klumpp M.D on 10/18/2015 at 7:41 AM  Between 7am to 6pm - Pager - [phone]  After 6pm go to [email or web address] - password [email or web address] [facility]  [facility]  Office  [phone]  CC: Primary care physician; Mila Merryonald Fisher, MD

## 2015-10-19 LAB — BASIC METABOLIC PANEL
ANION GAP: 6 (ref 5–15)
BUN: 9 mg/dL (ref 6–20)
CHLORIDE: 101 mmol/L (ref 101–111)
CO2: 28 mmol/L (ref 22–32)
Calcium: 7.9 mg/dL — ABNORMAL LOW (ref 8.9–10.3)
Creatinine, Ser: 0.66 mg/dL (ref 0.44–1.00)
GFR calc non Af Amer: >60 mL/min (ref >60)
GLUCOSE: 94 mg/dL (ref 65–99)
POTASSIUM: 4.1 mmol/L (ref 3.5–5.1)
Sodium: 135 mmol/L (ref 135–145)

## 2015-10-19 LAB — CBC
HEMATOCRIT: 24.2 % — AB (ref 35.0–47.0)
HEMOGLOBIN: 7.9 g/dL — AB (ref 12.0–16.0)
MCH: 29.9 pg (ref 26.0–34.0)
MCHC: 32.5 g/dL (ref 32.0–36.0)
MCV: 91.8 fL (ref 80.0–100.0)
Platelets: 258 10*3/uL (ref 150–440)
RBC: 2.64 MIL/uL — ABNORMAL LOW (ref 3.80–5.20)
RDW: 14.1 % (ref 11.5–14.5)
WBC: 14.3 10*3/uL — AB (ref 3.6–11.0)

## 2015-10-19 LAB — URINE CULTURE

## 2015-10-19 MED ORDER — IPRATROPIUM-ALBUTEROL 0.5-2.5 (3) MG/3ML IN SOLN: 3.0000 mL | Freq: Four times a day (QID) | RESPIRATORY_TRACT | Status: DC | PRN

## 2015-10-19 MED ORDER — BENZONATATE 100 MG PO CAPS
200.0000 mg | ORAL_CAPSULE | Freq: Three times a day (TID) | ORAL | Status: DC | PRN
  Administered 2015-10-19: 200 mg via ORAL
  Filled 2015-10-19: qty 2

## 2015-10-19 MED ORDER — BUDESONIDE 0.25 MG/2ML IN SUSP: 0.2500 mg | Freq: Two times a day (BID) | RESPIRATORY_TRACT | Status: DC

## 2015-10-19 MED ORDER — GUAIFENESIN-DM 100-10 MG/5ML PO SYRP
5.0000 mL | ORAL_SOLUTION | ORAL | Status: DC | PRN
Start: 2015-10-19 — End: 2015-10-22

## 2015-10-19 NOTE — Progress Notes (Signed)
Patient requesting to sit up in chair, assisted pt to the chair. Will continue to monitor the patient.

## 2015-10-19 NOTE — Progress Notes (Signed)
I was called to see the patient as the family was concerned the patient was more lethargic. Patient does appear sleepy however is very arousable. Family member was also concerned of increasing cough. Patient is currently being treated on broad-spectrum antibiotics for her pneumonia. Patient appears currently resting well and not in any acute distress. Patient was seen in the presence of Dr. Dema SeverinMungal, pulmonologist.    Temperature 98.2 pulse 76 respiratory rate 18 blood pressure 112/53 Gen. patient is alert she is somewhat sleepy but arousable Lungs: Patient is not in any acute distress. She is bilateral wheezing with rhonchi in the right lower lung. Abdomen: Pulses are positive nontender nondistended no hepatomegaly Neuro: Patient is  sleepy but arousable and answers questions appropriately Skin: No rashes noted  Assessment/plan:  Continue annual female with history of COPD and ongoing tobacco abuse who presented with sepsis secondary to multifocal pneumonia.  1. Sepsis secondary to multifocal pneumonia: I have spoken with the pulmonologist. Patient should continue vancomycin and Zosyn. Blood cultures are negative to date. Patient's white blood cell count is actually improving.  2. Lethargy: I suspect this is secondary to her sepsis and being hospitalized. She responds appropriately and her oxygen level is appropriate with oxygen therapy. If there is further concern for lethargy I would hold tramadol and fentanyl.  Time spent 35 minutes  Plan discussed with family member at bedside as well as Dr. Dema SeverinMungal

## 2015-10-19 NOTE — Progress Notes (Signed)
Spoke with Dr.Shah regarding patient not feeling well, assessed for pain- voiced no pain .Dr.Mody to come and round for Dr.Shah.

## 2015-10-19 NOTE — Progress Notes (Addendum)
[facility] - [place] at [facility]   PATIENT NAME: Cheryl [name]    MR#:  [id number]  DATE OF BIRTH:  1951-07-02  SUBJECTIVE:  CHIEF COMPLAINT:   Chief Complaint  Patient presents with  . Emesis  feels somewhat better but still short of breath and coughing, was febrile up to 101.5 this morning at 5:00, tachycardic REVIEW OF SYSTEMS:  Review of Systems  Constitutional: Positive for fever, weight loss and malaise/fatigue. Negative for diaphoresis.  HENT: Negative for ear discharge, ear pain, hearing loss, nosebleeds, sore throat and tinnitus.   Eyes: Negative for blurred vision and pain.  Respiratory: Positive for cough and shortness of breath. Negative for hemoptysis and wheezing.   Cardiovascular: Negative for chest pain, palpitations, orthopnea and leg swelling.  Gastrointestinal: Negative for heartburn, nausea, vomiting, abdominal pain, diarrhea, constipation and blood in stool.  Genitourinary: Negative for dysuria, urgency and frequency.  Musculoskeletal: Negative for myalgias and back pain.  Skin: Negative for itching and rash.  Neurological: Positive for weakness. Negative for dizziness, tingling, tremors, focal weakness, seizures and headaches.  Psychiatric/Behavioral: Negative for depression. The patient is not nervous/anxious.    DRUG ALLERGIES:   Allergies  Allergen Reactions  . Percocet [Oxycodone-Acetaminophen] Hives  . Aspirin Nausea And Vomiting and Other (See Comments)    Reaction:  GI upset   . Codeine Nausea And Vomiting and Other (See Comments)    Reaction:  GI upset   . Morphine Other (See Comments)    Reaction:  Unknown   . Pregabalin Other (See Comments)    Reaction:  Makes pt sleep excessively.    . Propoxyphene Other (See Comments)    Reaction:  GI upset   . Sulfa Antibiotics Rash and Other (See Comments)    Reaction:  GI upset    VITALS:  Blood pressure 120/50, pulse 112, temperature 99.6 F (37.6 C), temperature source  Oral, resp. rate 18, height  (1.549 m), weight 46.72 kg (103 lb), SpO2 93 %. PHYSICAL EXAMINATION:  Physical Exam  Constitutional: She is oriented to person, place, and time. Vital signs are normal. She appears malnourished. She appears cachectic.  HENT:  Head: Normocephalic and atraumatic.  Eyes: Conjunctivae and EOM are normal. Pupils are equal, round, and reactive to light.  Neck: Normal range of motion. Neck supple. No tracheal deviation present. No thyromegaly present.  Cardiovascular: Normal rate, regular rhythm and normal heart sounds.   Pulmonary/Chest: Effort normal. No respiratory distress. She has no wheezes. She has rhonchi in the right lower field. She exhibits no tenderness.  Abdominal: Soft. Bowel sounds are normal. She exhibits no distension. There is no tenderness.  Musculoskeletal: Normal range of motion.  Neurological: She is alert and oriented to person, place, and time. No cranial nerve deficit.  Skin: Skin is warm and dry. No rash noted.  Psychiatric: Mood and affect normal.   LABORATORY PANEL:   CBC  Recent Labs Lab 10/19/15 0600  WBC 14.3*  HGB 7.9*  HCT 24.2*  PLT 258   ------------------------------------------------------------------------------------------------------------------ Chemistries   Recent Labs Lab 10/17/15 1142 10/19/15 0600  NA 135 135  K 4.5 4.1  CL 100* 101  CO2 25 28  GLUCOSE 98 94  BUN 23* 9  CREATININE 1.01* 0.66  CALCIUM 8.7* 7.9*  AST 15  --   ALT 8*  --   ALKPHOS 74  --   BILITOT 0.5  --    RADIOLOGY:  Ct Chest Wo Contrast  10/18/2015  CLINICAL DATA:  female with a known history of COPD with ongoing tobacco abuse, GERD, hyperlipidemia, chronic pain comes to the emergency room accompanied by her husband with ongoing shortness of breath weakness and difficulty getting around at home. Recent admission for COPD exacerbation. EXAM: CT CHEST WITHOUT CONTRAST TECHNIQUE: Multidetector CT imaging of the chest was  performed following the standard protocol without IV contrast. COMPARISON:  Chest radiograph, 10/17/2015. FINDINGS: Neck base and axilla: No mass or adenopathy. Visualized thyroid is unremarkable. Mediastinum and hila: Heart mildly enlarged. Minor coronary artery calcifications. Multiple prominent to borderline enlarged lymph nodes. Largest node is a right subcarinal node measuring 14 mm in short axis. No mediastinal masses. No hilar masses or discrete enlarged lymph nodes. Lungs and pleura: Minimal effusions. Multiple areas of ground-glass 2 more dense consolidation are noted in the lungs, affecting all lobes, but predominantly on the right, particularly in the right lower lobe. There is mild interstitial thickening most evident in the right lung. No lung mass or discrete nodule. No pneumothorax. Limited upper abdomen: Small hiatal hernia. Otherwise unremarkable. 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 No osteoblastic or osteolytic lesions. IMPRESSION: 1. Multiple areas of ground-glass and more confluent airspace opacity throughout the lungs, predominantly on the right. This is consistent with multifocal pneumonia. Electronically Signed   By: Amie Portlandavid  Ormond M.D.   On: 10/18/2015 09:08   ASSESSMENT AND PLAN:  Cheryl DimmerMary [name] is a 64 y.o. female with a known history of COPD with ongoing tobacco abuse, GERD, hyperlipidemia, chronic pain comes to the emergency room accompanied by her husband with ongoing shortness of breath weakness and difficulty getting around at home.  1.Sepsis secondary to multifocal pneumonia. There could be a component of aspiration. CT chest confirmed multiple areas of groundglass opacities consistent with multifocal pneumonia, right more than the left, worrisome for aspiration variety. Continue IV vancomycin and Zosyn  blood cultures  negative and WBC count improving (20->14)  Breathing treatment as needed. Continue oxygen (She  uses as needed at home). Check ambulatory pulse ox - Await pulmonary consult  2. COPD with ongoing tobacco abuse. Continue oral inhalers, nebulizer Oxygen 2 L/m. Patient uses chronic home oxygen Initiate COPD Gold protocol due to multiple readmissions  3.Chronic pain continue tramadol and fentanyl.  4. Hyperlipidemia Continue statin  5. DVT prophylaxis Subcutaneous Lovenox  6. Leukocytosis Due to #1.  7.Tobacco abuse pt advised to quit smoking. About 3 mins spent    All the records are reviewed and case discussed with Care Management/Social Worker. Management plans discussed with the patient, family and they are in agreement.  CODE STATUS: full code  TOTAL TIME TAKING CARE OF THIS PATIENT: 35 minutes.   More than 50% of the time was spent in counseling/coordination of care: YES (Discussed with Daughter at bedside).  POSSIBLE D/C IN 1-2 DAYS, DEPENDING ON CLINICAL CONDITION. And Pulmo eval   [facility], Antwanette Wesche M.D on 10/19/2015 at 7:02 AM  Between 7am to 6pm - Pager - [phone]  After 6pm go to [email or web address] - password [email or web address] [facility]  [facility]  Office  [phone]  CC: Primary care physician; Mila Merryonald Fisher, MD

## 2015-10-19 NOTE — Progress Notes (Signed)
Per spouse pt has been coughing continuous. Spoke with Dr.Shah- regarding pt's cough, MD to place order for cough medication.

## 2015-10-19 NOTE — Progress Notes (Signed)
Nursing reports coughing - I've added Robitussin and Tessalorn pearls PRN.

## 2015-10-20 ENCOUNTER — Inpatient Hospital Stay: Payer: Commercial Managed Care - HMO

## 2015-10-20 LAB — CBC
HEMATOCRIT: 23.1 % — AB (ref 35.0–47.0)
Hemoglobin: 7.6 g/dL — ABNORMAL LOW (ref 12.0–16.0)
MCH: 30.4 pg (ref 26.0–34.0)
MCHC: 33 g/dL (ref 32.0–36.0)
MCV: 92.3 fL (ref 80.0–100.0)
PLATELETS: 254 10*3/uL (ref 150–440)
RBC: 2.5 MIL/uL — AB (ref 3.80–5.20)
RDW: 14.4 % (ref 11.5–14.5)
WBC: 17.3 10*3/uL — AB (ref 3.6–11.0)

## 2015-10-20 LAB — BASIC METABOLIC PANEL
ANION GAP: 5 (ref 5–15)
BUN: 8 mg/dL (ref 6–20)
CO2: 30 mmol/L (ref 22–32)
Calcium: 7.9 mg/dL — ABNORMAL LOW (ref 8.9–10.3)
Chloride: 102 mmol/L (ref 101–111)
Creatinine, Ser: 0.61 mg/dL (ref 0.44–1.00)
GFR calc Af Amer: >60 mL/min (ref >60)
GLUCOSE: 108 mg/dL — AB (ref 65–99)
POTASSIUM: 3.8 mmol/L (ref 3.5–5.1)
Sodium: 137 mmol/L (ref 135–145)

## 2015-10-20 MED ORDER — MUPIROCIN 2 % EX OINT
TOPICAL_OINTMENT | Freq: Two times a day (BID) | CUTANEOUS | Status: DC
Start: 2015-10-20 — End: 2015-10-22
  Administered 2015-10-20 – 2015-10-21 (×2): via NASAL
  Filled 2015-10-20: qty 22

## 2015-10-20 MED ORDER — IPRATROPIUM-ALBUTEROL 0.5-2.5 (3) MG/3ML IN SOLN
3.0000 mL | Freq: Four times a day (QID) | RESPIRATORY_TRACT | Status: DC
  Administered 2015-10-20 – 2015-10-22 (×8): 3 mL via RESPIRATORY_TRACT
  Filled 2015-10-20 (×8): qty 3

## 2015-10-20 MED ORDER — NYSTATIN 100000 UNIT/ML MT SUSP
5.0000 mL | Freq: Four times a day (QID) | OROMUCOSAL | Status: DC
  Administered 2015-10-20 – 2015-10-21 (×6): 500000 [IU] via OROMUCOSAL
  Filled 2015-10-20 (×6): qty 5

## 2015-10-20 MED ORDER — BUDESONIDE 0.25 MG/2ML IN SUSP
0.2500 mg | Freq: Two times a day (BID) | RESPIRATORY_TRACT | Status: DC
  Administered 2015-10-20 – 2015-10-22 (×5): 0.25 mg via RESPIRATORY_TRACT
  Filled 2015-10-20 (×5): qty 2

## 2015-10-20 NOTE — Progress Notes (Signed)
Accepted patient assignment at 1500.  Patient cooperative.  Stated that fentayl patch came off and does not know where it is.  She transferred to  Xray this shift and thinks it was lost then.

## 2015-10-20 NOTE — Progress Notes (Signed)
Physical Therapy Treatment Patient Details Name: Cheryl [name] MRN: [id number] DOB: [birth year] Today's Date: 10/20/2015    History of Present Illness Cheryl [name] is a 64 y.o. female with a known history of hypertension, COPD, PAD, CAD, dementia and chronic pain syndrome. The patient was sent to ED due to confusion today. The patient WAS confused and does not answer questions in the ED. According to the patient's husband, the patient was fine until the morning of arrival. She was found confused and complaining of chest and abdominal pain. The patient had multiple episodes similar problem before without causes found, after hospitalization for several days each time. Workup including CAT scan of the head, chest x-Cherylanne Ardelean, lab and urinalysis in the ED were negative.    PT Comments    Pt is making slow progress towards goals, however is motivated to work with therapy. Pt adamantly refuses to sit in recliner despite encouragement from staff. All mobility performed on 3L of O2, however during ambulation, pt O2 sats decrease to 66% (unsure of accurate reading secondary to nail polish). Seated rest break required and O2 increased to 4L of O2. RN notified. Sats increased to 93%. Pt with increased SOB, however symptoms improved with rest break.  Follow Up Recommendations  Home health PT;Supervision/Assistance - 24 hour     Equipment Recommendations       Recommendations for Other Services       Precautions / Restrictions Precautions Precautions: Fall Restrictions Weight Bearing Restrictions: No    Mobility  Bed Mobility Overal bed mobility: Needs Assistance Bed Mobility: Supine to Sit     Supine to sit: Min assist     General bed mobility comments: requires assist for bed mobility including scooting towards EOB  and use of bed rails  Transfers Overall transfer level: Needs assistance Equipment used: Rolling walker (2 wheeled) Transfers: Sit to/from Stand Sit to Stand: Min assist          General transfer comment: sit<>Stand with cues for correct technique including hand placement. Pt requires assist to stand and constant cga to maintain balance during static standing  Ambulation/Gait Ambulation/Gait assistance: Min guard Ambulation Distance (Feet): 40 Feet Assistive device: Rolling walker (2 wheeled) Gait Pattern/deviations: Step-to pattern     General Gait Details: short shuffling gait pattern noted with kyphotic posture. RW used as well as O2 donned. Pt gets fatigued quickly, unable to verablize to therapist. Seated rest break noted.   Stairs            Wheelchair Mobility    Modified Rankin (Stroke Patients Only)       Balance                                    Cognition Arousal/Alertness: Lethargic Behavior During Therapy: WFL for tasks assessed/performed Overall Cognitive Status: Within Functional Limits for tasks assessed                      Exercises Other Exercises Other Exercises: supine ther-ex x 10 reps on B LE including resisted knee flexion, Feet together SAQ, hip ab/add, and hip add squeezes. All ther-ex performed with cga    General Comments        Pertinent Vitals/Pain Pain Assessment: No/denies pain    Home Living                      Prior Function  PT Goals (current goals can now be found in the care plan section) Acute Rehab PT Goals Patient Stated Goal: To go home PT Goal Formulation: With patient Time For Goal Achievement: 11/01/15 Potential to Achieve Goals: Good Progress towards PT goals: Progressing toward goals    Frequency  Min 2X/week    PT Plan Current plan remains appropriate    Co-evaluation             End of Session Equipment Utilized During Treatment: Gait belt;Oxygen Activity Tolerance: Patient limited by fatigue;Patient limited by lethargy Patient left: in bed;with bed alarm set (pt refuses to sit in recliner despite encouragement)      Time: [id number] PT Time Calculation (min) (ACUTE ONLY): 28 min  Charges:  $Gait Training: 8-22 mins $Therapeutic Exercise: 8-22 mins                    G Codes:      Cristino Degroff 10/20/2015, 11:55 AM Elizabeth PalauStephanie Neyda Durango, PT, DPT [phone]

## 2015-10-20 NOTE — Care Management Important Message (Signed)
Important Message  Patient Details  Name: Cheryl [name] MRN: [id number] Date of Birth: [birth year]   Medicare Important Message Given:  Yes    Enijah Furr A, RN 10/20/2015, 9:26 AM

## 2015-10-20 NOTE — Plan of Care (Signed)
Family considering providing their blood if pt needs transfusion (Hemo currently 7.9).  Form obtained form Blood bank, Dr. Jefm MilesInformed/signed form and it was placed on chart if family decides to use.  Donors would need to be listed, call and speak with Thurston Holenne - then fax form to her to process. Thurston Holenne will contact each family member on form to set up appt. Family educated that it could take a few days for process to be ready to infuse and pt may not even need blood till 7.0 hemo (per dr).  If pt does not need blood, blood cannot be held and will be discarded by blood bank.  Family still considering options and will let RN staff know if they's like to move forward with family donations. RN/family also educated on current hosp transfusion processes and checks/balances that have been set in place to ensure accuracy and as much safety as possible.

## 2015-10-20 NOTE — Progress Notes (Signed)
Pharmacy Antibiotic Time-Out Note  Cheryl MargaritaMary George [name] is a 64 y.o. year-old female admitted on 10/17/2015.  The patient is currently on Vancomycin and Zosyn for sepsis.  Assessment/Plan: Per rounding, will discontinue vancomycin and continue patient on Zosyn.   Temp (24hrs), Avg:98.4 F (36.9 C), Min:98.2 F (36.8 C), Max:98.5 F (36.9 C)   Recent Labs Lab 10/17/15 1142 10/18/15 0603 10/19/15 0600 10/20/15 0548  WBC 22.2* 20.0* 14.3* 17.3*    Recent Labs Lab 10/17/15 1142 10/19/15 0600 10/20/15 0548  CREATININE 1.01* 0.66 0.61   Estimated Creatinine Clearance: 51 mL/min (by C-G formula based on Cr of 0.61).   Antimicrobial allergies: Sulfa  Antimicrobials this admission: Vancomycin  11/22 to 11/25  Zosyn 11/22 -     Thank you for allowing pharmacy to be a part of this patient's care.  )Simpson,Michael L PharmD 10/20/2015 4:14 PM

## 2015-10-20 NOTE — Progress Notes (Addendum)
[facility] - [place] at [facility]   PATIENT NAME: Cheryl [name]    MR#:  [id number]  DATE OF BIRTH:  [birth year]  SUBJECTIVE:  CHIEF COMPLAINT:   Chief Complaint  Patient presents with  . Emesis  feels somewhat better but still short of breath and coughing - now says it's light pink (may be blood tinged?)  REVIEW OF SYSTEMS:  Review of Systems  Constitutional: Positive for weight loss and malaise/fatigue. Negative for fever and diaphoresis.  HENT: Negative for ear discharge, ear pain, hearing loss, nosebleeds, sore throat and tinnitus.   Eyes: Negative for blurred vision and pain.  Respiratory: Positive for cough and shortness of breath. Negative for hemoptysis and wheezing.   Cardiovascular: Negative for chest pain, palpitations, orthopnea and leg swelling.  Gastrointestinal: Negative for heartburn, nausea, vomiting, abdominal pain, diarrhea, constipation and blood in stool.  Genitourinary: Negative for dysuria, urgency and frequency.  Musculoskeletal: Negative for myalgias and back pain.  Skin: Negative for itching and rash.  Neurological: Positive for weakness. Negative for dizziness, tingling, tremors, focal weakness, seizures and headaches.  Psychiatric/Behavioral: Negative for depression. The patient is not nervous/anxious.     DRUG ALLERGIES:   Allergies  Allergen Reactions  . Percocet [Oxycodone-Acetaminophen] Hives  . Aspirin Nausea And Vomiting and Other (See Comments)    Reaction:  GI upset   . Codeine Nausea And Vomiting and Other (See Comments)    Reaction:  GI upset   . Morphine Other (See Comments)    Reaction:  Unknown   . Pregabalin Other (See Comments)    Reaction:  Makes pt sleep excessively.    . Propoxyphene Other (See Comments)    Reaction:  GI upset   . Sulfa Antibiotics Rash and Other (See Comments)    Reaction:  GI upset    VITALS:  Blood pressure 129/65, pulse 77, temperature 98.2 F (36.8 C), temperature source Oral,  resp. rate 20, height  (1.549 m), weight 46.72 kg (103 lb), SpO2 99 %. PHYSICAL EXAMINATION:  Physical Exam  Constitutional: She is oriented to person, place, and time. Vital signs are normal. She appears malnourished. She appears unhealthy. She appears cachectic. She has a sickly appearance.  HENT:  Head: Normocephalic and atraumatic.  Eyes: Conjunctivae and EOM are normal. Pupils are equal, round, and reactive to light.  Neck: Normal range of motion. Neck supple. No tracheal deviation present. No thyromegaly present.  Cardiovascular: Normal rate, regular rhythm and normal heart sounds.   Pulmonary/Chest: Effort normal. No respiratory distress. She has no wheezes. She has rhonchi in the right lower field. She exhibits no tenderness.  Abdominal: Soft. Bowel sounds are normal. She exhibits no distension. There is no tenderness.  Musculoskeletal: Normal range of motion.  Neurological: She is alert and oriented to person, place, and time. No cranial nerve deficit.  Skin: Skin is warm and dry. No rash noted.  Psychiatric: Mood and affect normal.   LABORATORY PANEL:   CBC  Recent Labs Lab 10/20/15 0548  WBC 17.3*  HGB 7.6*  HCT 23.1*  PLT 254   ------------------------------------------------------------------------------------------------------------------ Chemistries   Recent Labs Lab 10/17/15 1142  10/20/15 0548  NA 135  < > 137  K 4.5  < > 3.8  CL 100*  < > 102  CO2 25  < > 30  GLUCOSE 98  < > 108*  BUN 23*  < > 8  CREATININE 1.01*  < > 0.61  CALCIUM 8.7*  < > 7.9*  AST  15  --   --   ALT 8*  --   --   ALKPHOS 74  --   --   BILITOT 0.5  --   --   < > = values in this interval not displayed. RADIOLOGY:  Dg Chest 2 View  10/20/2015  CLINICAL DATA:  Shortness of breath. EXAM: CHEST  2 VIEW COMPARISON:  October 17, 2015. FINDINGS: The heart size and mediastinal contours are within normal limits. No pneumothorax is noted. Minimal bilateral pleural effusions are  noted. Mild diffuse reticulonodular densities are noted throughout both lungs, but most prominently seen in the right upper lobe. The right lung opacities are improved compared to prior exam, and these findings are most consistent with inflammation. The visualized skeletal structures are unremarkable. IMPRESSION: Right lung opacities noted on prior exam are improved suggesting improving inflammation or pneumonia, although mild diffuse reticulonodular densities remain bilaterally. Minimal bilateral pleural effusions are noted posteriorly. Electronically Signed   By: Lupita RaiderJames  Green Jr, M.D.   On: 10/20/2015 07:50   ASSESSMENT AND PLAN:  Cheryl [name] is a 64 y.o. female with a known history of COPD with ongoing tobacco abuse, GERD, hyperlipidemia, chronic pain comes to the emergency room accompanied by her husband with ongoing shortness of breath weakness and difficulty getting around at home.  1.Sepsis secondary to right upper lobe pneumonia. There could be a component of aspiration. Continue IV vancomycin and Zosyn. monitor blood cultures and WBC count. Breathing treatment as needed. Continue oxygen - CT chest showed multifocal pneumonia. Pulmonary following  2. COPD with ongoing tobacco abuse. Continue oral inhalers, nebulizer Oxygen 2 L/m. Patient uses chronic home oxygen Initiate COPD Gold protocol due to multiple readmissions - THNcm C/S  3.Chronic pain continue tramadol and fentanyl.  4. Hyperlipidemia Continue statin  5. AOCD: Hb 8.6->7.9->7.6. no frank bleed, family wants to give their blood transfusion if need,   6. Leukocytosis Due to #1.  7.Tobacco abuse pt advised to quit smoking. About 3 mins spent  DVT prophylaxis Subcutaneous Lovenox  All the records are reviewed and case discussed with Care Management/Social Worker. Management plans discussed with the patient, family and they are in agreement.  CODE STATUS: full code  TOTAL TIME TAKING CARE OF THIS PATIENT: 35  minutes.   More than 50% of the time was spent in counseling/coordination of care: YES  POSSIBLE D/C IN 2-3 DAYS, DEPENDING ON CLINICAL CONDITION.   [facility], Tabbitha Janvrin M.D on 10/20/2015 at 2:07 PM  Between 7am to 6pm - Pager - [phone]  After 6pm go to [email or web address] - password [email or web address]  [facility]  Office  [phone]  CC: Primary care physician; Mila Merryonald Fisher, MD

## 2015-10-20 NOTE — Consult Note (Signed)
   [facility]   10/20/2015  Cheryl [name] 04-20-1951 [id number]   Second EPIC referral [facility]. [facility] Please see chart review tab and notes in EPIC to see further details for patient outreach [facility]. Made inpatient RNCM aware on 10/18/15 that [facility].   Raiford NobleAtika Megha Agnes, MSN-Ed, RN,BSN [facility] Care Management Hospital Liaison [phone]

## 2015-10-21 DIAGNOSIS — J150 Pneumonia due to Klebsiella pneumoniae: Secondary | ICD-10-CM

## 2015-10-21 DIAGNOSIS — J441 Chronic obstructive pulmonary disease with (acute) exacerbation: Secondary | ICD-10-CM

## 2015-10-21 LAB — BASIC METABOLIC PANEL
Anion gap: 6 (ref 5–15)
BUN: 8 mg/dL (ref 6–20)
CHLORIDE: 101 mmol/L (ref 101–111)
CO2: 31 mmol/L (ref 22–32)
CREATININE: 0.67 mg/dL (ref 0.44–1.00)
Calcium: 8.2 mg/dL — ABNORMAL LOW (ref 8.9–10.3)
Glucose, Bld: 121 mg/dL — ABNORMAL HIGH (ref 65–99)
Potassium: 3.6 mmol/L (ref 3.5–5.1)
SODIUM: 138 mmol/L (ref 135–145)

## 2015-10-21 LAB — CBC
HCT: 23.7 % — ABNORMAL LOW (ref 35.0–47.0)
HEMOGLOBIN: 7.7 g/dL — AB (ref 12.0–16.0)
MCH: 29.8 pg (ref 26.0–34.0)
MCHC: 32.2 g/dL (ref 32.0–36.0)
MCV: 92.5 fL (ref 80.0–100.0)
PLATELETS: 271 10*3/uL (ref 150–440)
RBC: 2.57 MIL/uL — AB (ref 3.80–5.20)
RDW: 14.4 % (ref 11.5–14.5)
WBC: 11.9 10*3/uL — ABNORMAL HIGH (ref 3.6–11.0)

## 2015-10-21 LAB — CULTURE, RESPIRATORY W GRAM STAIN: Special Requests: NORMAL

## 2015-10-21 LAB — CULTURE, RESPIRATORY

## 2015-10-21 NOTE — Progress Notes (Signed)
[facility] - [place] at [facility]   PATIENT NAME: Cheryl [name]    MR#:  [id number]  DATE OF BIRTH:  28-May-1951  SUBJECTIVE:  CHIEF COMPLAINT:   Chief Complaint  Patient presents with  . Emesis  feeling much better  REVIEW OF SYSTEMS:  Review of Systems  Constitutional: Positive for weight loss and malaise/fatigue. Negative for fever and diaphoresis.  HENT: Negative for ear discharge, ear pain, hearing loss, nosebleeds, sore throat and tinnitus.   Eyes: Negative for blurred vision and pain.  Respiratory: Positive for cough. Negative for hemoptysis, shortness of breath and wheezing.   Cardiovascular: Negative for chest pain, palpitations, orthopnea and leg swelling.  Gastrointestinal: Negative for heartburn, nausea, vomiting, abdominal pain, diarrhea, constipation and blood in stool.  Genitourinary: Negative for dysuria, urgency and frequency.  Musculoskeletal: Negative for myalgias and back pain.  Skin: Negative for itching and rash.  Neurological: Positive for weakness. Negative for dizziness, tingling, tremors, focal weakness, seizures and headaches.  Psychiatric/Behavioral: Negative for depression. The patient is not nervous/anxious.     DRUG ALLERGIES:   Allergies  Allergen Reactions  . Percocet [Oxycodone-Acetaminophen] Hives  . Aspirin Nausea And Vomiting and Other (See Comments)    Reaction:  GI upset   . Codeine Nausea And Vomiting and Other (See Comments)    Reaction:  GI upset   . Morphine Other (See Comments)    Reaction:  Unknown   . Pregabalin Other (See Comments)    Reaction:  Makes pt sleep excessively.    . Propoxyphene Other (See Comments)    Reaction:  GI upset   . Sulfa Antibiotics Rash and Other (See Comments)    Reaction:  GI upset    VITALS:  Blood pressure 132/70, pulse 72, temperature 97.9 F (36.6 C), temperature source Oral, resp. rate 18, height  (1.549 m), weight 46.72 kg (103 lb), SpO2 96 %. PHYSICAL  EXAMINATION:  Physical Exam  Constitutional: She is oriented to person, place, and time. Vital signs are normal. She appears malnourished. She appears unhealthy. She appears cachectic.  HENT:  Head: Normocephalic and atraumatic.  Eyes: Conjunctivae and EOM are normal. Pupils are equal, round, and reactive to light.  Neck: Normal range of motion. Neck supple. No tracheal deviation present. No thyromegaly present.  Cardiovascular: Normal rate, regular rhythm and normal heart sounds.   Pulmonary/Chest: Effort normal. No respiratory distress. She has no wheezes. She exhibits no tenderness.  Abdominal: Soft. Bowel sounds are normal. She exhibits no distension. There is no tenderness.  Musculoskeletal: Normal range of motion.  Neurological: She is alert and oriented to person, place, and time. No cranial nerve deficit.  Skin: Skin is warm and dry. No rash noted.  Psychiatric: Mood and affect normal.   LABORATORY PANEL:   CBC  Recent Labs Lab 10/21/15 0523  WBC 11.9*  HGB 7.7*  HCT 23.7*  PLT 271   ------------------------------------------------------------------------------------------------------------------ Chemistries   Recent Labs Lab 10/17/15 1142  10/21/15 0523  NA 135  < > 138  K 4.5  < > 3.6  CL 100*  < > 101  CO2 25  < > 31  GLUCOSE 98  < > 121*  BUN 23*  < > 8  CREATININE 1.01*  < > 0.67  CALCIUM 8.7*  < > 8.2*  AST 15  --   --   ALT 8*  --   --   ALKPHOS 74  --   --   BILITOT 0.5  --   --   < > =  values in this interval not displayed. RADIOLOGY:  Dg Chest 2 View  10/20/2015  CLINICAL DATA:  Shortness of breath. EXAM: CHEST  2 VIEW COMPARISON:  October 17, 2015. FINDINGS: The heart size and mediastinal contours are within normal limits. No pneumothorax is noted. Minimal bilateral pleural effusions are noted. Mild diffuse reticulonodular densities are noted throughout both lungs, but most prominently seen in the right upper lobe. The right lung opacities are  improved compared to prior exam, and these findings are most consistent with inflammation. The visualized skeletal structures are unremarkable. IMPRESSION: Right lung opacities noted on prior exam are improved suggesting improving inflammation or pneumonia, although mild diffuse reticulonodular densities remain bilaterally. Minimal bilateral pleural effusions are noted posteriorly. Electronically Signed   By: Lupita RaiderJames  Green Jr, M.D.   On: 10/20/2015 07:50   ASSESSMENT AND PLAN:  Cheryl [name] is a 64 y.o. female with a known history of COPD with ongoing tobacco abuse, GERD, hyperlipidemia, chronic pain comes to the emergency room accompanied by her husband with ongoing shortness of breath weakness and difficulty getting around at home.  1.Sepsis secondary to right upper lobe pneumonia. There could be a component of aspiration. Continue IV Zosyn. Stop vanco blood cultures neg and WBC count improving Breathing treatment as needed. Continue oxygen - CT chest showed multifocal pneumonia. Pulmonary following  2. COPD with ongoing tobacco abuse. Continue oral inhalers, nebulizer Oxygen 2 L/m. Patient uses chronic home oxygen Initiate COPD Gold protocol due to multiple readmissions - THNcm C/S  3.Chronic pain continue tramadol and fentanyl.  4. Hyperlipidemia Continue statin  5. AOCD: Hb 8.6->7.9->7.6->7.7 no frank bleed, family wants to give their blood transfusion if need,   6. Leukocytosis: improving Due to #1.  7.Tobacco abuse pt advised to quit smoking. About 3 mins spent  DVT prophylaxis Subcutaneous Lovenox  Encourage Ambulation. Stop ivf today as she is eating.  All the records are reviewed and case discussed with Care Management/Social Worker. Management plans discussed with the patient, family and they are in agreement.  CODE STATUS: full code  TOTAL TIME TAKING CARE OF THIS PATIENT: 35 minutes.   More than 50% of the time was spent in counseling/coordination of care:  YES  POSSIBLE D/C IN 1-2 DAYS, DEPENDING ON CLINICAL CONDITION. Hoping for tomorrow.   [facility], Franny Selvage M.D on 10/21/2015 at 7:19 AM  Between 7am to 6pm - Pager - [phone]  After 6pm go to [email or web address] - password [email or web address] [facility]  [facility] [facility]  Office  [phone]  CC: Primary care physician; Mila Merryonald Fisher, MD

## 2015-10-21 NOTE — Consult Note (Signed)
PULMONARY / CRITICAL CARE MEDICINE   Name: Cheryl [name] MRN: [id number] DOB: [birth year]    ADMISSION DATE:  10/17/2015 CONSULTATION DATE:  10/19/15  REFERRING MD :  Dr. Delfino Lovett   CHIEF COMPLAINT:     Cough, vomiting  Reason for consult - COPD GOLD  HISTORY OF PRESENT ILLNESS    63 y.o. female with a known history of COPD with ongoing tobacco abuse, GERD, hyperlipidemia, chronic pain comes to the emergency room accompanied by her husband with ongoing shortness of breath weakness and difficulty getting around at home on 10/17/15.Patient was recently admitted with COPD exacerbation was discharged with a Z-Pak completed it. She started having intractable vomiting yesterday per husband. She was seen today in Dr. Theodis Aguas office and routine lab work showed white count of 22,000. Chest x-ray shows extensive pneumonia right sided. She was asked to come to the emergency room. The ER she received IV Vancomycin and Zosyn. She was found to be hypotensive with blood pressure systolic in the 80s admitted white count chest exercise suggestive of pneumonia. Patient is being admitted with sepsis secondary to Health care acquired Pneumonia  SUBJECTIVE:  Patient more awake and alert today, able to verbalize needs. Overall with good clinical improvement, still with some mild shortness of breath and cough and weakness.   SIGNIFICANT EVENTS  11/17>admission for bronchitis, AMS, right breast mass    PAST MEDICAL HISTORY    :  Past Medical History  Diagnosis Date  . COPD (chronic obstructive pulmonary disease) (HCC)   . Hypertension   . Arthritis   . Hyperlipidemia   . GERD (gastroesophageal reflux disease)   . Neuropathy (HCC) 2010  . Oxygen deficiency   . Coronary artery disease   . Headache   . Anxiety   . Peripheral vascular disease (HCC)   . Vitamin D deficiency   . Migraines   . Chronic pain   . DVT (deep venous thrombosis) (HCC)   . Pneumonia   . Osteoporosis   . Allergy    . Asthma    Past Surgical History  Procedure Laterality Date  . Appendectomy    . Spine surgery    . Foot surgery Bilateral     5-6 years per patient  . Cardiac catherization  10/31/2009  . Abdomnal aortic stent  05/30/2008    Dr. Nanetta Batty  . Abdominal hysterectomy  1975    Bilaterl Oophorectomy; Dur to IUD infection  . Cholecystectomy  1972  . Cervical fusion  C5 - 6/C6-7  . Appendectomy    . Colonoscopy with propofol N/A 07/27/2015    Procedure: COLONOSCOPY WITH PROPOFOL;  Surgeon: Wallace Cullens, MD;  Location: [facility] ENDOSCOPY;  Service: Gastroenterology;  Laterality: N/A;  . Esophagogastroduodenoscopy (egd) with propofol N/A 07/27/2015    Procedure: ESOPHAGOGASTRODUODENOSCOPY (EGD) WITH PROPOFOL;  Surgeon: Wallace Cullens, MD;  Location: [facility] ENDOSCOPY;  Service: Gastroenterology;  Laterality: N/A;   Prior to Admission medications   Medication Sig Start Date End Date Taking? Authorizing Provider  acetaminophen (TYLENOL) 500 MG tablet Take 1,000 mg by mouth every 6 (six) hours as needed for mild pain or headache.    Yes Historical Provider, MD  albuterol (PROVENTIL HFA;VENTOLIN HFA) 108 (90 BASE) MCG/ACT inhaler Inhale 2 puffs into the lungs every 4 (four) hours as needed for wheezing or shortness of breath. 08/11/15  Yes Malva Limes, MD  albuterol (PROVENTIL) (2.5 MG/3ML) 0.083% nebulizer solution Take 3 mLs (2.5 mg total) by nebulization every 4 (four) hours as  needed for wheezing. 10/12/15  Yes Katharina Caperima Vaickute, MD  ALPRAZolam Prudy Feeler(XANAX) 0.5 MG tablet Take 0.5 mg by mouth every 6 (six) hours as needed for anxiety.   Yes Historical Provider, MD  celecoxib (CELEBREX) 200 MG capsule Take 200 mg by mouth daily.   Yes Historical Provider, MD  clopidogrel (PLAVIX) 75 MG tablet Take 75 mg by mouth daily.   Yes Historical Provider, MD  diphenoxylate-atropine (LOMOTIL) 2.5-0.025 MG per tablet Take 2 tablets by mouth 2 (two) times daily as needed for diarrhea or loose stools.    Yes Historical  Provider, MD  donepezil (ARICEPT) 10 MG tablet Take 10 mg by mouth at bedtime.   Yes Historical Provider, MD  estropipate (OGEN) 0.75 MG tablet Take 0.75 mg by mouth daily.   Yes Historical Provider, MD  fentaNYL (DURAGESIC - DOSED MCG/HR) 50 MCG/HR Place 1 patch (50 mcg total) onto the skin every 3 (three) days. 09/04/15  Yes Delano MetzFrancisco Naveira, MD  ferrous sulfate 325 (65 FE) MG tablet Take 325 mg by mouth daily.    Yes Historical Provider, MD  Fluticasone-Salmeterol (ADVAIR) 250-50 MCG/DOSE AEPB Inhale 1 puff into the lungs 2 (two) times daily.   Yes Historical Provider, MD  furosemide (LASIX) 40 MG tablet Take 40 mg by mouth daily.   Yes Historical Provider, MD  mupirocin cream (BACTROBAN) 2 % Apply 1 application topically 3 (three) times daily.   Yes Historical Provider, MD  pantoprazole (PROTONIX) 40 MG tablet Take 40 mg by mouth 2 (two) times daily.   Yes Historical Provider, MD  pregabalin (LYRICA) 150 MG capsule Take 1 capsule (150 mg total) by mouth 2 (two) times daily. 09/04/15  Yes Delano MetzFrancisco Naveira, MD  silver sulfADIAZINE (SILVADENE) 1 % cream Apply 1 application topically daily as needed (for redness on legs).    Yes Historical Provider, MD  simvastatin (ZOCOR) 10 MG tablet Take 10 mg by mouth at bedtime.    Yes Historical Provider, MD  SUMAtriptan (IMITREX) 25 MG tablet Take 25 mg by mouth as needed for migraine. May repeat in 2 hours if headache persists or recurs.   Yes Historical Provider, MD  tiotropium (SPIRIVA) 18 MCG inhalation capsule Place 18 mcg into inhaler and inhale daily.   Yes Historical Provider, MD  traMADol (ULTRAM) 50 MG tablet Take 100 mg by mouth 4 (four) times daily.   Yes Historical Provider, MD  azithromycin (ZITHROMAX) 250 MG tablet Take 1 tablet (250 mg total) by mouth daily. Patient not taking: Reported on 10/17/2015 10/11/15   Katharina Caperima Vaickute, MD   Allergies  Allergen Reactions  . Percocet [Oxycodone-Acetaminophen] Hives  . Aspirin Nausea And Vomiting and  Other (See Comments)    Reaction:  GI upset   . Codeine Nausea And Vomiting and Other (See Comments)    Reaction:  GI upset   . Morphine Other (See Comments)    Reaction:  Unknown   . Pregabalin Other (See Comments)    Reaction:  Makes pt sleep excessively.    . Propoxyphene Other (See Comments)    Reaction:  GI upset   . Sulfa Antibiotics Rash and Other (See Comments)    Reaction:  GI upset      FAMILY HISTORY   Family History  Problem Relation Age of Onset  . Cancer Mother   . Arthritis Mother   . Heart disease Mother   . Diabetes Mother     mellitus, type 2  . Heart disease Father   . Diabetes Sister   .  Cancer Brother   . Cancer Brother     lung  . Diabetes Brother       SOCIAL HISTORY    reports that she has been smoking Cigarettes.  She has a 50 pack-year smoking history. She does not have any smokeless tobacco history on file. She reports that she does not drink alcohol or use illicit drugs.  Review of Systems  Constitutional: Negative for fever and chills.  HENT: Negative for hearing loss.   Eyes: Negative for blurred vision and double vision.  Respiratory: Positive for cough, sputum production and shortness of breath.   Gastrointestinal: Negative for heartburn, nausea, vomiting and abdominal pain.  Genitourinary: Negative for dysuria.  Musculoskeletal: Negative for myalgias.  Skin: Negative for itching and rash.  Neurological: Positive for weakness. Negative for headaches.  Endo/Heme/Allergies: Does not bruise/bleed easily.  Psychiatric/Behavioral: Negative for depression.      VITAL SIGNS    Temp:  [97.9 F (36.6 C)-99.7 F (37.6 C)] 98.5 F (36.9 C) (11/26 0826) Pulse Rate:  [72-84] 84 (11/26 0826) Resp:  [18-20] 20 (11/26 0826) BP: (123-149)/(57-70) 149/63 mmHg (11/26 0826) SpO2:  [91 %-98 %] 93 % (11/26 0826) HEMODYNAMICS:   VENTILATOR SETTINGS:   INTAKE / OUTPUT:  Intake/Output Summary (Last 24 hours) at 10/21/15 1017 Last data  filed at 10/21/15 0800  Gross per 24 hour  Intake 2556.25 ml  Output   1550 ml  Net 1006.25 ml       PHYSICAL EXAM   Physical Exam  HENT:  Head: Normocephalic and atraumatic.  Right Ear: External ear normal.  Left Ear: External ear normal.  Eyes: Conjunctivae and EOM are normal. Pupils are equal, round, and reactive to light.  Neck: Normal range of motion. Neck supple.  Cardiovascular: Normal rate, regular rhythm, normal heart sounds and intact distal pulses.   Pulmonary/Chest: No respiratory distress. She has no wheezes. She has no rales. She exhibits no tenderness.  Dec BS at the bases - but improving   Abdominal: Soft. Bowel sounds are normal.  Musculoskeletal: Normal range of motion.  Neurological: She is alert.  Somnolent, easily awaken  Skin: Skin is warm and dry.       LABS   LABS:  CBC  Recent Labs Lab 10/19/15 0600 10/20/15 0548 10/21/15 0523  WBC 14.3* 17.3* 11.9*  HGB 7.9* 7.6* 7.7*  HCT 24.2* 23.1* 23.7*  PLT 258 254 271   Coag's  Recent Labs Lab 10/17/15 1813  APTT 38*  INR 1.08   BMET  Recent Labs Lab 10/19/15 0600 10/20/15 0548 10/21/15 0523  NA 135 137 138  K 4.1 3.8 3.6  CL 101 102 101  CO2 BUN CREATININE 0.66 0.61 0.67  GLUCOSE 94 108* 121*   Electrolytes  Recent Labs Lab 10/19/15 0600 10/20/15 0548 10/21/15 0523  CALCIUM 7.9* 7.9* 8.2*   Sepsis Markers  Recent Labs Lab 10/17/15 1813 10/17/15 2034  LATICACIDVEN 2.4* 2.0   ABG No results for input(s): PHART, PCO2ART, PO2ART in the last 168 hours. Liver Enzymes  Recent Labs Lab 10/17/15 1142  AST 15  ALT 8*  ALKPHOS 74  BILITOT 0.5  ALBUMIN 3.3*   Cardiac Enzymes  Recent Labs Lab 10/17/15 1813  TROPONINI <0.03   Glucose No results for input(s): GLUCAP in the last 168 hours.   Recent Results (from the past 240 hour(s))  Blood Culture (routine x 2)     Status: None (Preliminary result)   Collection  Time: 10/17/15  6:13 PM   Result Value Ref Range Status   Specimen Description BLOOD  RIGHT, ARM  Final   Special Requests   Final    BOTTLES DRAWN AEROBIC AND ANAEROBIC  7CC AERO, 6CC ANAERO   Culture NO GROWTH 4 DAYS  Final   Report Status PENDING  Incomplete  Blood Culture (routine x 2)     Status: None (Preliminary result)   Collection Time: 10/17/15  6:15 PM  Result Value Ref Range Status   Specimen Description BLOOD LEFT ARM  Final   Special Requests   Final    BOTTLES DRAWN AEROBIC AND ANAEROBIC  3CC AERO, 4CC ANAERO   Culture NO GROWTH 4 DAYS  Final   Report Status PENDING  Incomplete  Urine culture     Status: None   Collection Time: 10/17/15  6:55 PM  Result Value Ref Range Status   Specimen Description URINE, RANDOM  Final   Special Requests NONE  Final   Culture INSIGNIFICANT GROWTH  Final   Report Status 10/19/2015 FINAL  Final  Culture, expectorated sputum-assessment     Status: None   Collection Time: 10/17/15  7:42 PM  Result Value Ref Range Status   Specimen Description SPU  Final   Special Requests Normal  Final   Sputum evaluation THIS SPECIMEN IS ACCEPTABLE FOR SPUTUM CULTURE  Final   Report Status 10/17/2015 FINAL  Final  Culture, respiratory (NON-Expectorated)     Status: None   Collection Time: 10/17/15  7:42 PM  Result Value Ref Range Status   Specimen Description SPU  Final   Special Requests Normal Reflexed from [id number]  Final   Gram Stain   Final    MANY WBC SEEN FEW GRAM NEGATIVE RODS RARE GRAM POSITIVE COCCI GOOD SPECIMEN - 80-90% WBCS    Culture LIGHT GROWTH KLEBSIELLA PNEUMONIAE  Final   Report Status 10/21/2015 FINAL  Final   Organism ID, Bacteria KLEBSIELLA PNEUMONIAE  Final      Susceptibility   Klebsiella pneumoniae - MIC*    AMPICILLIN Value in next row Resistant      RESISTANT16    CEFTAZIDIME Value in next row Sensitive      SENSITIVE<=1    CEFEPIME Value in next row Sensitive      SENSITIVE<=1    CEFAZOLIN Value in next row Sensitive      SENSITIVE<=4     CEFTRIAXONE Value in next row Sensitive      SENSITIVE<=1    CIPROFLOXACIN Value in next row Sensitive      SENSITIVE<=0.25    GENTAMICIN Value in next row Sensitive      SENSITIVE<=1    IMIPENEM Value in next row Sensitive      SENSITIVE<=0.25    TRIMETH/SULFA Value in next row Sensitive      SENSITIVE<=20    PIP/TAZO Value in next row Sensitive      SENSITIVE<=4    * LIGHT GROWTH KLEBSIELLA PNEUMONIAE     Current facility-administered medications:  .  acetaminophen (TYLENOL) tablet 650 mg, 650 mg, Oral, Q6H PRN, Delfino Lovett, MD, 650 mg at 10/19/15 0511 .  ALPRAZolam Prudy Feeler) tablet 0.5 mg, 0.5 mg, Oral, Q6H PRN, Enedina Finner, MD, 0.5 mg at 10/20/15 2148 .  benzonatate (TESSALON) capsule 200 mg, 200 mg, Oral, TID PRN, Delfino Lovett, MD, 200 mg at 10/19/15 1144 .  budesonide (PULMICORT) nebulizer solution 0.25 mg, 0.25 mg, Nebulization, BID, Dalyn Becker, MD, 0.25 mg at 10/21/15 0750 .  clopidogrel (PLAVIX)  tablet 75 mg, 75 mg, Oral, Daily, Enedina Finner, MD, 75 mg at 10/21/15 0931 .  diphenoxylate-atropine (LOMOTIL) 2.5-0.025 MG per tablet 2 tablet, 2 tablet, Oral, BID PRN, Enedina Finner, MD .  donepezil (ARICEPT) tablet 10 mg, 10 mg, Oral, QHS, Enedina Finner, MD, 10 mg at 10/20/15 2147 .  enoxaparin (LOVENOX) injection 40 mg, 40 mg, Subcutaneous, Q24H, Enedina Finner, MD, 40 mg at 10/20/15 2139 .  estropipate (OGEN) tablet 0.75 mg, 0.75 mg, Oral, Daily, Enedina Finner, MD, 0.75 mg at 10/21/15 0930 .  fentaNYL (DURAGESIC - dosed mcg/hr) 50 mcg, 50 mcg, Transdermal, Q72H, Enedina Finner, MD, 50 mcg at 10/20/15 2143 .  ferrous sulfate tablet 325 mg, 325 mg, Oral, Daily, Enedina Finner, MD, 325 mg at 10/21/15 0931 .  guaiFENesin-dextromethorphan (ROBITUSSIN DM) 100-10 MG/5ML syrup 5 mL, 5 mL, Oral, Q4H PRN, Vipul Shah, MD .  ipratropium-albuterol (DUONEB) 0.5-2.5 (3) MG/3ML nebulizer solution 3 mL, 3 mL, Nebulization, Q4H PRN, Hawkins Seaman, MD .  ipratropium-albuterol (DUONEB) 0.5-2.5 (3) MG/3ML nebulizer solution  3 mL, 3 mL, Nebulization, Q6H, Jaquelin Meaney, MD, 3 mL at 10/21/15 0750 .  mupirocin ointment (BACTROBAN) 2 %, , Nasal, BID, Altamese Dilling, MD .  nystatin (MYCOSTATIN) 100000 UNIT/ML suspension 500,000 Units, 5 mL, Mouth/Throat, QID, Altamese Dilling, MD, 500,000 Units at 10/21/15 0930 .  ondansetron (ZOFRAN) tablet 4 mg, 4 mg, Oral, Q6H PRN **OR** ondansetron (ZOFRAN) injection 4 mg, 4 mg, Intravenous, Q6H PRN, Enedina Finner, MD .  pantoprazole (PROTONIX) EC tablet 40 mg, 40 mg, Oral, BID AC, Enedina Finner, MD, 40 mg at 10/21/15 0930 .  piperacillin-tazobactam (ZOSYN) IVPB 3.375 g, 3.375 g, Intravenous, 3 times per day, Delfino Lovett, MD, 3.375 g at 10/21/15 0328 .  pregabalin (LYRICA) capsule 150 mg, 150 mg, Oral, BID, Enedina Finner, MD, 150 mg at 10/21/15 0931 .  simvastatin (ZOCOR) tablet 10 mg, 10 mg, Oral, QHS, Enedina Finner, MD, 10 mg at 10/20/15 2148 .  traMADol (ULTRAM) tablet 100 mg, 100 mg, Oral, QID, Enedina Finner, MD, 100 mg at 10/21/15 0931  IMAGING    No results found.    Indwelling Urinary Catheter continued, requirement due to   Reason to continue Indwelling Urinary Catheter for strict Intake/Output monitoring for hemodynamic instability   Central Line continued, requirement due to   Reason to continue [employer] Monitoring of central venous pressure or other hemodynamic parameters   Ventilator continued, requirement due to, resp failure    Ventilator Sedation RASS 0 to -2   Cultures: BCx2  UC  Sputum 11/22>> Klebsiella pneumonia  Antibiotics: Zosyn Vanc  Lines:   ASSESSMENT/PLAN  64 yo female with COPD, tobacco abuse, GERD, HLD, chronic pain, seen in consultation for possible COPD, Pneumonia   Right lower lobe pneumonia - possible HCAP - Currently on vancomycin and Zosyn, based on sputum culture can switch to ciprofloxacin or Bactrim - concern for possible mirco aspiration given the location of the ground glass opacities - recommend barium swallow and  speech evaluation  - polypharmacy (pain meds and psychotropic meds) could be an etiology for decrease motility, mentation, making her high risk for aspiration - sputum culture with Klebsiella pneumonia - Patient with overall good pulmonary recovery, still with weakness and decreased respiratory effort, I believe at this time she is most likely decondition physical therapy and incentive spirometry will aid in her recovery.  COPD  - stop dulera and spiriva - start pulmicort BID x 3 days, then resume dulera - start duoneb scheduled q6 hrs x 3  days, then cont with prn 4 hrs for sob\wheezing - prednisone 40mg  x 3 days, then taper over 2 weeks.  - resume home meds closer to discharge.  - incentive spirometry  Tobacco abuse - patient counseled on tobacco cessation  Thank you for consulting [facility], we will signoff at this time.  Please feel free to contacts [place] with any questions.     I have personally obtained a history, examined the patient, evaluated laboratory and imaging results, formulated the assessment and plan and placed orders.  Pulmonary consult time - 35 minutes   Stephanie Acre, MD [facility] Pager [phone] (please enter 7-digits) On Call Pager - [phone] (please enter 7-digits)     10/21/2015, 10:17 AM  Note: This note was prepared with Dragon dictation along with smaller phrase technology. Any transcriptional errors that result from this process are unintentional.

## 2015-10-22 LAB — CBC
HEMATOCRIT: 23.3 % — AB (ref 35.0–47.0)
HEMOGLOBIN: 7.8 g/dL — AB (ref 12.0–16.0)
MCH: 31.2 pg (ref 26.0–34.0)
MCHC: 33.6 g/dL (ref 32.0–36.0)
MCV: 92.8 fL (ref 80.0–100.0)
Platelets: 250 10*3/uL (ref 150–440)
RBC: 2.51 MIL/uL — ABNORMAL LOW (ref 3.80–5.20)
RDW: 14.2 % (ref 11.5–14.5)
WBC: 8.9 10*3/uL (ref 3.6–11.0)

## 2015-10-22 LAB — CULTURE, BLOOD (ROUTINE X 2)
CULTURE: NO GROWTH
Culture: NO GROWTH

## 2015-10-22 LAB — BASIC METABOLIC PANEL
Anion gap: 7 (ref 5–15)
BUN: 8 mg/dL (ref 6–20)
CHLORIDE: 101 mmol/L (ref 101–111)
CO2: 31 mmol/L (ref 22–32)
CREATININE: 0.56 mg/dL (ref 0.44–1.00)
Calcium: 8.3 mg/dL — ABNORMAL LOW (ref 8.9–10.3)
GFR calc Af Amer: >60 mL/min (ref >60)
GFR calc non Af Amer: >60 mL/min (ref >60)
Glucose, Bld: 93 mg/dL (ref 65–99)
Potassium: 3.8 mmol/L (ref 3.5–5.1)
Sodium: 139 mmol/L (ref 135–145)

## 2015-10-22 MED ORDER — AMOXICILLIN-POT CLAVULANATE 875-125 MG PO TABS: 1.0000 | ORAL_TABLET | Freq: Two times a day (BID) | ORAL | Status: DC

## 2015-10-22 MED ORDER — GUAIFENESIN-DM 100-10 MG/5ML PO SYRP: 5.0000 mL | ORAL_SOLUTION | ORAL | Status: DC | PRN

## 2015-10-22 MED ORDER — BENZONATATE 200 MG PO CAPS: 200.0000 mg | ORAL_CAPSULE | Freq: Three times a day (TID) | ORAL | Status: DC | PRN

## 2015-10-22 NOTE — Discharge Instructions (Signed)

## 2015-10-22 NOTE — Discharge Summary (Signed)
[facility] [place] at [facility]   PATIENT NAME: Cheryl DimmerMary [name]    MR#:  [id number]  DATE OF BIRTH:  [birth year]  DATE OF ADMISSION:  10/17/2015 ADMITTING PHYSICIAN: Enedina FinnerSona Patel, MD  DATE OF DISCHARGE: 10/22/2015  PRIMARY CARE PHYSICIAN: Mila Merryonald Fisher, MD    ADMISSION DIAGNOSIS:  Healthcare-associated pneumonia [J18.9] Sepsis, due to unspecified organism (HCC) [A41.9]  DISCHARGE DIAGNOSIS:  Active Problems:   Sepsis (HCC) KLEBSIELLA PNEUMONIAE pneumonia COPD Anemia of chronic kidney disease SECONDARY DIAGNOSIS:   Past Medical History  Diagnosis Date  . COPD (chronic obstructive pulmonary disease) (HCC)   . Hypertension   . Arthritis   . Hyperlipidemia   . GERD (gastroesophageal reflux disease)   . Neuropathy (HCC) 2010  . Oxygen deficiency   . Coronary artery disease   . Headache   . Anxiety   . Peripheral vascular disease (HCC)   . Vitamin D deficiency   . Migraines   . Chronic pain   . DVT (deep venous thrombosis) (HCC)   . Pneumonia   . Osteoporosis   . Allergy   . Asthma     HOSPITAL COURSE:  64 y.o. female with a known history of COPD with ongoing tobacco abuse, GERD, hyperlipidemia, chronic pain comes to the emergency room accompanied by her husband with ongoing shortness of breath weakness and difficulty getting around at home. Patient was recently admitted with COPD exacerbation was discharged with a Z-Pak completed it. She started having intractable vomiting yesterday per husband. She was seen today in Dr. Theodis AguasFisher's office and routine lab work showed white count of 22,000. Chest x-ray shows extensive pneumonia right sided. She was asked to come to the emergency room. She was started on IV Vancomycin and Zosyn. She was found to be hypotensive with blood pressure systolic in the 80s admitted white count chest exercise suggestive of pneumonia. Patient was admitted with sepsis secondary to Health care acquired Pneumonia.  Her sputum  culture grew KLEBSIELLA PNEUMONIAE and antibiotics restore based on the culture sensitivity.  She was responding well to IV broad-spectrum antibiotics.  Her leukocytosis has resolved.  Her recent chest x-ray also showed significant improvement.  Has remained afebrile and is feeling much better.  She is being discharged home with home health services.  She is feeling much better and is agreeable with the discharge plan. DISCHARGE CONDITIONS:  Stable  CONSULTS OBTAINED:   pulmonary critical care medicine - Stephanie AcreVishal Mungal, MD  DRUG ALLERGIES:   Allergies  Allergen Reactions  . Percocet [Oxycodone-Acetaminophen] Hives  . Aspirin Nausea And Vomiting and Other (See Comments)    Reaction:  GI upset   . Codeine Nausea And Vomiting and Other (See Comments)    Reaction:  GI upset   . Morphine Other (See Comments)    Reaction:  Unknown   . Pregabalin Other (See Comments)    Reaction:  Makes pt sleep excessively.    . Propoxyphene Other (See Comments)    Reaction:  GI upset   . Sulfa Antibiotics Rash and Other (See Comments)    Reaction:  GI upset     DISCHARGE MEDICATIONS:   Current Discharge Medication List    START taking these medications   Details  amoxicillin-clavulanate (AUGMENTIN) 875-125 MG tablet Take 1 tablet by mouth 2 (two) times daily. Qty: 14 tablet, Refills: 0    benzonatate (TESSALON) 200 MG capsule Take 1 capsule (200 mg total) by mouth 3 (three) times daily as needed for cough. Qty: 20 capsule, Refills:  0    guaiFENesin-dextromethorphan (ROBITUSSIN DM) 100-10 MG/5ML syrup Take 5 mLs by mouth every 4 (four) hours as needed for cough. Qty: 118 mL, Refills: 0      CONTINUE these medications which have NOT CHANGED   Details  acetaminophen (TYLENOL) 500 MG tablet Take 1,000 mg by mouth every 6 (six) hours as needed for mild pain or headache.     albuterol (PROVENTIL HFA;VENTOLIN HFA) 108 (90 BASE) MCG/ACT inhaler Inhale 2 puffs into the lungs every 4 (four) hours as  needed for wheezing or shortness of breath. Qty: 18 g, Refills: 3    albuterol (PROVENTIL) (2.5 MG/3ML) 0.083% nebulizer solution Take 3 mLs (2.5 mg total) by nebulization every 4 (four) hours as needed for wheezing. Qty: 75 mL, Refills: 12    ALPRAZolam (XANAX) 0.5 MG tablet Take 0.5 mg by mouth every 6 (six) hours as needed for anxiety.    celecoxib (CELEBREX) 200 MG capsule Take 200 mg by mouth daily.    clopidogrel (PLAVIX) 75 MG tablet Take 75 mg by mouth daily.    diphenoxylate-atropine (LOMOTIL) 2.5-0.025 MG per tablet Take 2 tablets by mouth 2 (two) times daily as needed for diarrhea or loose stools.     donepezil (ARICEPT) 10 MG tablet Take 10 mg by mouth at bedtime.    estropipate (OGEN) 0.75 MG tablet Take 0.75 mg by mouth daily.    fentaNYL (DURAGESIC - DOSED MCG/HR) 50 MCG/HR Place 1 patch (50 mcg total) onto the skin every 3 (three) days. Qty: 10 patch, Refills: 0   Associated Diagnoses: Chronic pain syndrome    ferrous sulfate 325 (65 FE) MG tablet Take 325 mg by mouth daily.     Fluticasone-Salmeterol (ADVAIR) 250-50 MCG/DOSE AEPB Inhale 1 puff into the lungs 2 (two) times daily.    furosemide (LASIX) 40 MG tablet Take 40 mg by mouth daily.    mupirocin cream (BACTROBAN) 2 % Apply 1 application topically 3 (three) times daily.    pantoprazole (PROTONIX) 40 MG tablet Take 40 mg by mouth 2 (two) times daily.    pregabalin (LYRICA) 150 MG capsule Take 1 capsule (150 mg total) by mouth 2 (two) times daily. Qty: 60 capsule, Refills: 2   Associated Diagnoses: Neuropathy (HCC)    silver sulfADIAZINE (SILVADENE) 1 % cream Apply 1 application topically daily as needed (for redness on legs).     simvastatin (ZOCOR) 10 MG tablet Take 10 mg by mouth at bedtime.     SUMAtriptan (IMITREX) 25 MG tablet Take 25 mg by mouth as needed for migraine. May repeat in 2 hours if headache persists or recurs.    tiotropium (SPIRIVA) 18 MCG inhalation capsule Place 18 mcg into inhaler  and inhale daily.    traMADol (ULTRAM) 50 MG tablet Take 100 mg by mouth 4 (four) times daily.    azithromycin (ZITHROMAX) 250 MG tablet Take 1 tablet (250 mg total) by mouth daily. Qty: 4 each, Refills: 0       DISCHARGE INSTRUCTIONS:    DIET:  Cardiac diet  DISCHARGE CONDITION:  Good  ACTIVITY:  Activity as tolerated  OXYGEN:  Home Oxygen: Yes.     Oxygen Delivery: 2 liters/min via Patient connected to nasal cannula oxygen  DISCHARGE LOCATION:  home with home health services  If you experience worsening of your admission symptoms, develop shortness of breath, life threatening emergency, suicidal or homicidal thoughts you must seek medical attention immediately by calling 911 or calling your MD immediately  if symptoms less  severe.  You Must read complete instructions/literature along with all the possible adverse reactions/side effects for all the Medicines you take and that have been prescribed to you. Take any new Medicines after you have completely understood and accpet all the possible adverse reactions/side effects.   Please note  You were cared for by a hospitalist during your hospital stay. If you have any questions about your discharge medications or the care you received while you were in the hospital after you are discharged, you can call the unit and asked to speak with the hospitalist on call if the hospitalist that took care of you is not available. Once you are discharged, your primary care physician will handle any further medical issues. Please note that NO REFILLS for any discharge medications will be authorized once you are discharged, as it is imperative that you return to your primary care physician (or establish a relationship with a primary care physician if you do not have one) for your aftercare needs so that they can reassess your need for medications and monitor your lab values.    On the day of Discharge: VITAL SIGNS:  Blood pressure 139/55, pulse  76, temperature 98.1 F (36.7 C), temperature source Oral, resp. rate 17, height 5\' 1"  (1.549 m), weight 46.72 kg (103 lb), SpO2 97 %. PHYSICAL EXAMINATION:  GENERAL:  64 y.o.-year-old patient lying in the bed with no acute distress.  EYES: Pupils equal, round, reactive to light and accommodation. No scleral icterus. Extraocular muscles intact.  HEENT: Head atraumatic, normocephalic. Oropharynx and nasopharynx clear.  NECK:  Supple, no jugular venous distention. No thyroid enlargement, no tenderness.  LUNGS: Normal breath sounds bilaterally, no wheezing, rales,rhonchi or crepitation. No use of accessory muscles of respiration.  CARDIOVASCULAR: S1, S2 normal. No murmurs, rubs, or gallops.  ABDOMEN: Soft, non-tender, non-distended. Bowel sounds present. No organomegaly or mass.  EXTREMITIES: No pedal edema, cyanosis, or clubbing.  NEUROLOGIC: Cranial nerves II through XII are intact. Muscle strength 5/5 in all extremities. Sensation intact. Gait not checked.  PSYCHIATRIC: The patient is alert and oriented x 3.  SKIN: No obvious rash, lesion, or ulcer.  DATA REVIEW:   CBC  Recent Labs Lab 10/22/15 0526  WBC 8.9  HGB 7.8*  HCT 23.3*  PLT 250    Chemistries   Recent Labs Lab 10/17/15 1142  10/22/15 0526  NA 135  < > 139  K 4.5  < > 3.8  CL 100*  < > 101  CO2 25  < > 31  GLUCOSE 98  < > 93  BUN 23*  < > 8  CREATININE 1.01*  < > 0.56  CALCIUM 8.7*  < > 8.3*  AST 15  --   --   ALT 8*  --   --   ALKPHOS 74  --   --   BILITOT 0.5  --   --   < > = values in this interval not displayed.  Cardiac Enzymes  Recent Labs Lab 10/17/15 1813  TROPONINI <0.03    Microbiology Results  Results for orders placed or performed during the hospital encounter of 10/17/15  Blood Culture (routine x 2)     Status: None (Preliminary result)   Collection Time: 10/17/15  6:13 PM  Result Value Ref Range Status   Specimen Description BLOOD  RIGHT, ARM  Final   Special Requests   Final     BOTTLES DRAWN AEROBIC AND ANAEROBIC  7CC AERO, 6CC ANAERO   Culture NO GROWTH  4 DAYS  Final   Report Status PENDING  Incomplete  Blood Culture (routine x 2)     Status: None (Preliminary result)   Collection Time: 10/17/15  6:15 PM  Result Value Ref Range Status   Specimen Description BLOOD LEFT ARM  Final   Special Requests   Final    BOTTLES DRAWN AEROBIC AND ANAEROBIC  3CC AERO, 4CC ANAERO   Culture NO GROWTH 4 DAYS  Final   Report Status PENDING  Incomplete  Urine culture     Status: None   Collection Time: 10/17/15  6:55 PM  Result Value Ref Range Status   Specimen Description URINE, RANDOM  Final   Special Requests NONE  Final   Culture INSIGNIFICANT GROWTH  Final   Report Status 10/19/2015 FINAL  Final  Culture, expectorated sputum-assessment     Status: None   Collection Time: 10/17/15  7:42 PM  Result Value Ref Range Status   Specimen Description SPU  Final   Special Requests Normal  Final   Sputum evaluation THIS SPECIMEN IS ACCEPTABLE FOR SPUTUM CULTURE  Final   Report Status 10/17/2015 FINAL  Final  Culture, respiratory (NON-Expectorated)     Status: None   Collection Time: 10/17/15  7:42 PM  Result Value Ref Range Status   Specimen Description SPU  Final   Special Requests Normal Reflexed from [id number]  Final   Gram Stain   Final    MANY WBC SEEN FEW GRAM NEGATIVE RODS RARE GRAM POSITIVE COCCI GOOD SPECIMEN - 80-90% WBCS    Culture LIGHT GROWTH KLEBSIELLA PNEUMONIAE  Final   Report Status 10/21/2015 FINAL  Final   Organism ID, Bacteria KLEBSIELLA PNEUMONIAE  Final      Susceptibility   Klebsiella pneumoniae - MIC*    AMPICILLIN Value in next row Resistant      RESISTANT16    CEFTAZIDIME Value in next row Sensitive      SENSITIVE<=1    CEFEPIME Value in next row Sensitive      SENSITIVE<=1    CEFAZOLIN Value in next row Sensitive      SENSITIVE<=4    CEFTRIAXONE Value in next row Sensitive      SENSITIVE<=1    CIPROFLOXACIN Value in next row Sensitive       SENSITIVE<=0.25    GENTAMICIN Value in next row Sensitive      SENSITIVE<=1    IMIPENEM Value in next row Sensitive      SENSITIVE<=0.25    TRIMETH/SULFA Value in next row Sensitive      SENSITIVE<=20    PIP/TAZO Value in next row Sensitive      SENSITIVE<=4    * LIGHT GROWTH KLEBSIELLA PNEUMONIAE   She is at very high risk for readmission.  She has already had 3 readmissions for the last 6 month. I highly recommend pulmonary rehabilitation and I have referred her for same.  I have also recommended [facility] care management to see what they can do to avoid readmissions.  Management plans discussed with the patient, family and they are in agreement.  CODE STATUS: Full code  TOTAL TIME TAKING CARE OF THIS PATIENT: 55 minutes.    [facility], Lakindra Wible M.D on 10/22/2015 at 8:42 AM  Between 7am to 6pm - Pager - [phone]  After 6pm go to [email or web address] - password [email or web address] [facility]  [place] [facility]  Office  [phone]  CC: Primary care physician; Mila Merry, MD Stephanie Acre, MD

## 2015-10-22 NOTE — Care Management Note (Addendum)
Case Management Note  Patient Details  Name: Cheryl [name] MRN: [id number] Date of Birth: 21-Apr-1951  Subjective/Objective:  Referral called to Britta [facility]ccre[facility]Barbara at [facility] and faxed requesting home health PT, Aid, social worker, Respiratory Therapy. Cheryl [name] is on the COPD Gold program and has been followed at home by [facility]. Has a home nebulizer and a rolling walker supplied by [facility]. Chronic continuous home oxygen is supplied by [employer].                   Action/Plan:   Expected Discharge Date:  10/19/15               Expected Discharge Plan:     In-House Referral:     Discharge planning Services  CM Consult (COPD Gold)  Post Acute Care Choice:  Home Health Choice offered to:  Patient, Spouse  DME Arranged:    DME Agency:     HH Arranged:  RN, PT HH Agency:  [facility]  Status of Service:  In process, will continue to follow  Medicare Important Message Given:  Yes Date Medicare IM Given:    Medicare IM give by:    Date Additional Medicare IM Given:    Additional Medicare Important Message give by:     If discussed at Long Length of Stay Meetings, dates discussed:    Additional Comments:  Nardos Putnam A, RN 10/22/2015, 8:41 AM

## 2015-10-22 NOTE — Progress Notes (Signed)
Patient discharged to home with [facility]. IV's removed. D/C & Rx instructions given. Patient's family packed belongings. X-ray previously ordered for patient, but pt d/c'd when they came.

## 2015-10-23 ENCOUNTER — Other Ambulatory Visit: Payer: Self-pay | Admitting: *Deleted

## 2015-10-23 DIAGNOSIS — J440 Chronic obstructive pulmonary disease with acute lower respiratory infection: Secondary | ICD-10-CM | POA: Diagnosis not present

## 2015-10-23 DIAGNOSIS — I739 Peripheral vascular disease, unspecified: Secondary | ICD-10-CM | POA: Diagnosis not present

## 2015-10-23 DIAGNOSIS — J209 Acute bronchitis, unspecified: Secondary | ICD-10-CM | POA: Diagnosis not present

## 2015-10-23 DIAGNOSIS — G43909 Migraine, unspecified, not intractable, without status migrainosus: Secondary | ICD-10-CM | POA: Diagnosis not present

## 2015-10-23 DIAGNOSIS — F0390 Unspecified dementia without behavioral disturbance: Secondary | ICD-10-CM | POA: Diagnosis not present

## 2015-10-23 DIAGNOSIS — I2510 Atherosclerotic heart disease of native coronary artery without angina pectoris: Secondary | ICD-10-CM | POA: Diagnosis not present

## 2015-10-23 DIAGNOSIS — G609 Hereditary and idiopathic neuropathy, unspecified: Secondary | ICD-10-CM | POA: Diagnosis not present

## 2015-10-23 DIAGNOSIS — I10 Essential (primary) hypertension: Secondary | ICD-10-CM | POA: Diagnosis not present

## 2015-10-23 DIAGNOSIS — G894 Chronic pain syndrome: Secondary | ICD-10-CM | POA: Diagnosis not present

## 2015-10-23 MED ORDER — ESTROPIPATE 0.75 MG PO TABS: 0.7500 mg | ORAL_TABLET | Freq: Every day | ORAL | Status: DC

## 2015-10-24 DIAGNOSIS — G609 Hereditary and idiopathic neuropathy, unspecified: Secondary | ICD-10-CM | POA: Diagnosis not present

## 2015-10-24 DIAGNOSIS — G894 Chronic pain syndrome: Secondary | ICD-10-CM | POA: Diagnosis not present

## 2015-10-24 DIAGNOSIS — J440 Chronic obstructive pulmonary disease with acute lower respiratory infection: Secondary | ICD-10-CM | POA: Diagnosis not present

## 2015-10-24 DIAGNOSIS — J209 Acute bronchitis, unspecified: Secondary | ICD-10-CM | POA: Diagnosis not present

## 2015-10-24 DIAGNOSIS — I739 Peripheral vascular disease, unspecified: Secondary | ICD-10-CM | POA: Diagnosis not present

## 2015-10-24 DIAGNOSIS — G629 Polyneuropathy, unspecified: Secondary | ICD-10-CM | POA: Diagnosis not present

## 2015-10-24 DIAGNOSIS — I2510 Atherosclerotic heart disease of native coronary artery without angina pectoris: Secondary | ICD-10-CM | POA: Diagnosis not present

## 2015-10-24 DIAGNOSIS — F0390 Unspecified dementia without behavioral disturbance: Secondary | ICD-10-CM | POA: Diagnosis not present

## 2015-10-24 DIAGNOSIS — J449 Chronic obstructive pulmonary disease, unspecified: Secondary | ICD-10-CM | POA: Diagnosis not present

## 2015-10-24 DIAGNOSIS — R29898 Other symptoms and signs involving the musculoskeletal system: Secondary | ICD-10-CM | POA: Diagnosis not present

## 2015-10-24 DIAGNOSIS — I10 Essential (primary) hypertension: Secondary | ICD-10-CM | POA: Diagnosis not present

## 2015-10-24 DIAGNOSIS — G43909 Migraine, unspecified, not intractable, without status migrainosus: Secondary | ICD-10-CM | POA: Diagnosis not present

## 2015-10-24 NOTE — Telephone Encounter (Signed)
Orders have been placed as of 10/13/15 per chart-aa

## 2015-10-25 ENCOUNTER — Other Ambulatory Visit: Payer: Self-pay | Admitting: *Deleted

## 2015-10-25 ENCOUNTER — Other Ambulatory Visit: Payer: Self-pay | Admitting: Family Medicine

## 2015-10-25 DIAGNOSIS — I2510 Atherosclerotic heart disease of native coronary artery without angina pectoris: Secondary | ICD-10-CM | POA: Diagnosis not present

## 2015-10-25 DIAGNOSIS — F0390 Unspecified dementia without behavioral disturbance: Secondary | ICD-10-CM | POA: Diagnosis not present

## 2015-10-25 DIAGNOSIS — Z09 Encounter for follow-up examination after completed treatment for conditions other than malignant neoplasm: Secondary | ICD-10-CM

## 2015-10-25 DIAGNOSIS — G609 Hereditary and idiopathic neuropathy, unspecified: Secondary | ICD-10-CM | POA: Diagnosis not present

## 2015-10-25 DIAGNOSIS — J440 Chronic obstructive pulmonary disease with acute lower respiratory infection: Secondary | ICD-10-CM | POA: Diagnosis not present

## 2015-10-25 DIAGNOSIS — J209 Acute bronchitis, unspecified: Secondary | ICD-10-CM | POA: Diagnosis not present

## 2015-10-25 DIAGNOSIS — G43909 Migraine, unspecified, not intractable, without status migrainosus: Secondary | ICD-10-CM | POA: Diagnosis not present

## 2015-10-25 DIAGNOSIS — I739 Peripheral vascular disease, unspecified: Secondary | ICD-10-CM | POA: Diagnosis not present

## 2015-10-25 DIAGNOSIS — G894 Chronic pain syndrome: Secondary | ICD-10-CM | POA: Diagnosis not present

## 2015-10-25 DIAGNOSIS — I10 Essential (primary) hypertension: Secondary | ICD-10-CM | POA: Diagnosis not present

## 2015-10-25 MED ORDER — TIOTROPIUM BROMIDE MONOHYDRATE 18 MCG IN CAPS: 18.0000 ug | ORAL_CAPSULE | Freq: Every day | RESPIRATORY_TRACT | Status: DC

## 2015-10-25 NOTE — Patient Outreach (Signed)
RNCM made first transition of care call. Transition of care flow sheet completed. Pt stated [facility] was assisting pt with [facility] to receive more intense therapy to get her strength back. Pt stated she was feeling better but was still really weak from the two hospital stays. Pt denies increased cough, congestion, or mucus production. Pt states she is still SOB with exertion. RNCM had previously scheduled a home visit for tomorrow but patient stated she would probably be moving to the SNF and would not be home. Pt visit cancelled, but RNCM inquired if she could contact pt when d/c'd from the SNF and pt agreeable.   Plan: [facility] to [facility].  Cheryl HatcherJanci Aishani Kalis RN, BSN  [facility] Care Management [phone])

## 2015-10-26 ENCOUNTER — Telehealth: Payer: Self-pay | Admitting: Family Medicine

## 2015-10-26 ENCOUNTER — Ambulatory Visit: Payer: Commercial Managed Care - HMO | Admitting: *Deleted

## 2015-10-26 NOTE — Telephone Encounter (Signed)
Arline AspCindy wanted to make sure that Dr[facility]Fisher [facility] and is going to fax again today. Arline AspCindy stated that she needs this back ASAP so they can place pt b/c pt doesn't have care at home. Thanks TNP

## 2015-10-26 NOTE — Progress Notes (Signed)
This encounter was created in error - please disregard.

## 2015-10-27 ENCOUNTER — Other Ambulatory Visit: Payer: Self-pay | Admitting: Family Medicine

## 2015-10-27 DIAGNOSIS — K219 Gastro-esophageal reflux disease without esophagitis: Secondary | ICD-10-CM | POA: Diagnosis not present

## 2015-10-27 DIAGNOSIS — M6281 Muscle weakness (generalized): Secondary | ICD-10-CM | POA: Diagnosis not present

## 2015-10-27 DIAGNOSIS — A419 Sepsis, unspecified organism: Secondary | ICD-10-CM | POA: Diagnosis not present

## 2015-10-27 DIAGNOSIS — Z1231 Encounter for screening mammogram for malignant neoplasm of breast: Secondary | ICD-10-CM | POA: Diagnosis not present

## 2015-10-27 DIAGNOSIS — J209 Acute bronchitis, unspecified: Secondary | ICD-10-CM | POA: Diagnosis not present

## 2015-10-27 DIAGNOSIS — R2681 Unsteadiness on feet: Secondary | ICD-10-CM | POA: Diagnosis not present

## 2015-10-27 DIAGNOSIS — J440 Chronic obstructive pulmonary disease with acute lower respiratory infection: Secondary | ICD-10-CM | POA: Diagnosis not present

## 2015-10-27 DIAGNOSIS — G609 Hereditary and idiopathic neuropathy, unspecified: Secondary | ICD-10-CM | POA: Diagnosis not present

## 2015-10-27 DIAGNOSIS — G309 Alzheimer's disease, unspecified: Secondary | ICD-10-CM | POA: Diagnosis not present

## 2015-10-27 DIAGNOSIS — F0390 Unspecified dementia without behavioral disturbance: Secondary | ICD-10-CM | POA: Diagnosis not present

## 2015-10-27 DIAGNOSIS — Z9181 History of falling: Secondary | ICD-10-CM | POA: Diagnosis not present

## 2015-10-27 DIAGNOSIS — I10 Essential (primary) hypertension: Secondary | ICD-10-CM | POA: Diagnosis not present

## 2015-10-27 DIAGNOSIS — J449 Chronic obstructive pulmonary disease, unspecified: Secondary | ICD-10-CM | POA: Diagnosis not present

## 2015-10-27 DIAGNOSIS — G43909 Migraine, unspecified, not intractable, without status migrainosus: Secondary | ICD-10-CM | POA: Diagnosis not present

## 2015-10-27 DIAGNOSIS — Z736 Limitation of activities due to disability: Secondary | ICD-10-CM | POA: Diagnosis not present

## 2015-10-27 DIAGNOSIS — G894 Chronic pain syndrome: Secondary | ICD-10-CM | POA: Diagnosis not present

## 2015-10-27 DIAGNOSIS — F419 Anxiety disorder, unspecified: Secondary | ICD-10-CM | POA: Diagnosis not present

## 2015-10-27 DIAGNOSIS — I2510 Atherosclerotic heart disease of native coronary artery without angina pectoris: Secondary | ICD-10-CM | POA: Diagnosis not present

## 2015-10-27 DIAGNOSIS — J189 Pneumonia, unspecified organism: Secondary | ICD-10-CM | POA: Diagnosis not present

## 2015-10-27 DIAGNOSIS — R928 Other abnormal and inconclusive findings on diagnostic imaging of breast: Secondary | ICD-10-CM | POA: Diagnosis not present

## 2015-10-27 DIAGNOSIS — I739 Peripheral vascular disease, unspecified: Secondary | ICD-10-CM | POA: Diagnosis not present

## 2015-10-27 NOTE — Telephone Encounter (Signed)
Done. Sent to medical records to be faxed.

## 2015-10-31 ENCOUNTER — Ambulatory Visit
Admission: RE | Admit: 2015-10-31 | Discharge: 2015-10-31 | Disposition: A | Discharge status: Home / Self Care | Payer: Commercial Managed Care - HMO | Source: Ambulatory Visit | Attending: Family Medicine | Admitting: Family Medicine

## 2015-10-31 DIAGNOSIS — K219 Gastro-esophageal reflux disease without esophagitis: Secondary | ICD-10-CM | POA: Diagnosis not present

## 2015-10-31 DIAGNOSIS — A419 Sepsis, unspecified organism: Secondary | ICD-10-CM | POA: Diagnosis not present

## 2015-10-31 DIAGNOSIS — J449 Chronic obstructive pulmonary disease, unspecified: Secondary | ICD-10-CM | POA: Diagnosis not present

## 2015-10-31 DIAGNOSIS — I10 Essential (primary) hypertension: Secondary | ICD-10-CM | POA: Diagnosis not present

## 2015-10-31 DIAGNOSIS — F419 Anxiety disorder, unspecified: Secondary | ICD-10-CM | POA: Diagnosis not present

## 2015-10-31 DIAGNOSIS — J189 Pneumonia, unspecified organism: Secondary | ICD-10-CM | POA: Diagnosis not present

## 2015-10-31 DIAGNOSIS — R928 Other abnormal and inconclusive findings on diagnostic imaging of breast: Secondary | ICD-10-CM | POA: Diagnosis not present

## 2015-10-31 DIAGNOSIS — Z1231 Encounter for screening mammogram for malignant neoplasm of breast: Secondary | ICD-10-CM | POA: Insufficient documentation

## 2015-10-31 DIAGNOSIS — G309 Alzheimer's disease, unspecified: Secondary | ICD-10-CM | POA: Diagnosis not present

## 2015-10-31 LAB — HM MAMMOGRAPHY

## 2015-11-02 ENCOUNTER — Other Ambulatory Visit: Payer: Self-pay | Admitting: *Deleted

## 2015-11-02 ENCOUNTER — Inpatient Hospital Stay: Payer: Commercial Managed Care - HMO | Admitting: Family Medicine

## 2015-11-02 NOTE — Patient Outreach (Signed)
[facility] Care Management  [facility]  11/02/2015  Cheryl [name] [birth year] [id number]  Subjective:  Patient is a [age] year od female currently  in rehabilitation [facility].  Objective:   Current Medications:  Current Outpatient Prescriptions  Medication Sig Dispense Refill  . acetaminophen (TYLENOL) 500 MG tablet Take 1,000 mg by mouth every 6 (six) hours as needed for mild pain or headache.     . albuterol (PROVENTIL HFA;VENTOLIN HFA) 108 (90 BASE) MCG/ACT inhaler Inhale 2 puffs into the lungs every 4 (four) hours as needed for wheezing or shortness of breath. 18 g 3  . albuterol (PROVENTIL) (2.5 MG/3ML) 0.083% nebulizer solution Take 3 mLs (2.5 mg total) by nebulization every 4 (four) hours as needed for wheezing. 75 mL 12  . ALPRAZolam (XANAX) 0.5 MG tablet Take 0.5 mg by mouth every 6 (six) hours as needed for anxiety.    Marland Kitchen. amoxicillin-clavulanate (AUGMENTIN) 875-125 MG tablet Take 1 tablet by mouth 2 (two) times daily. 14 tablet 0  . benzonatate (TESSALON) 200 MG capsule Take 1 capsule (200 mg total) by mouth 3 (three) times daily as needed for cough. 20 capsule 0  . celecoxib (CELEBREX) 200 MG capsule Take 200 mg by mouth daily.    . clopidogrel (PLAVIX) 75 MG tablet Take 75 mg by mouth daily.    . diphenoxylate-atropine (LOMOTIL) 2.5-0.025 MG per tablet Take 2 tablets by mouth 2 (two) times daily as needed for diarrhea or loose stools.     . donepezil (ARICEPT) 10 MG tablet Take 10 mg by mouth at bedtime.    Marland Kitchen. estropipate (OGEN) 0.75 MG tablet Take 1 tablet (0.75 mg total) by mouth daily. 30 tablet 6  . fentaNYL (DURAGESIC - DOSED MCG/HR) 50 MCG/HR Place 1 patch (50 mcg total) onto the skin every 3 (three) days. 10 patch 0  . ferrous sulfate 325 (65 FE) MG tablet Take 325 mg by mouth daily.     . Fluticasone-Salmeterol (ADVAIR) 250-50 MCG/DOSE AEPB Inhale 1 puff into the lungs 2 (two) times daily.    . furosemide (LASIX) 40 MG tablet Take 40  mg by mouth daily.    Marland Kitchen. guaiFENesin-dextromethorphan (ROBITUSSIN DM) 100-10 MG/5ML syrup Take 5 mLs by mouth every 4 (four) hours as needed for cough. 118 mL 0  . mupirocin cream (BACTROBAN) 2 % Apply 1 application topically 3 (three) times daily.    . pantoprazole (PROTONIX) 40 MG tablet Take 40 mg by mouth 2 (two) times daily.    . pregabalin (LYRICA) 150 MG capsule Take 1 capsule (150 mg total) by mouth 2 (two) times daily. 60 capsule 2  . silver sulfADIAZINE (SILVADENE) 1 % cream Apply 1 application topically daily as needed (for redness on legs).     . simvastatin (ZOCOR) 10 MG tablet Take 10 mg by mouth at bedtime.     . SUMAtriptan (IMITREX) 25 MG tablet Take 25 mg by mouth as needed for migraine. May repeat in 2 hours if headache persists or recurs.    Marland Kitchen. tiotropium (SPIRIVA) 18 MCG inhalation capsule Place 1 capsule (18 mcg total) into inhaler and inhale daily. 30 capsule 12  . traMADol (ULTRAM) 50 MG tablet Take 100 mg by mouth 4 (four) times daily.     No current facility-administered medications for this visit.    Functional Status:  In your present state of health, do you have any difficulty performing the following activities: 10/17/2015 10/09/2015  Hearing? N N  Vision? N  N  Difficulty concentrating or making decisions? N N  Walking or climbing stairs? Y N  Dressing or bathing? Y N  Doing errands, shopping? N N  Preparing Food and eating ? - -  Using the Toilet? - -  In the past six months, have you accidently leaked urine? - -  Do you have problems with loss of bowel control? - -  Managing your Medications? - -  Managing your Finances? - -  Housekeeping or managing your Housekeeping? - -    Fall/Depression Screening:  PHQ 2/9 Scores 09/13/2015 09/01/2015 07/13/2015 06/06/2015 05/01/2015 03/30/2015  PHQ - 2 Score 0 0 0 0 0 0    Assessment: Patient currently in [place] skilled nursing facility.  Per patient, she is participating actively in treatment in hopes to return  home before Christmas.  Patient describes a supportive family and spouse.  Per patient, her spouse manages her medications and the household finances.  This Child psychotherapist spoke with Beth-discharge planner who stated that patient was progressing well in treatment and should be discharging soon with [facility] (Physical, Occupational therapy and nursing).  Per discharge planner, she has no concerns regarding patient discharging home.  Patient very friendly and engaging as well.  Denies depressed mood.  States that she too has spoken with the discharge planner  who states a tentative discharge date of  11/07/15.  Plan:  This social worker will notify RNCM of tentative discharge date of 11/07/15 with home health services.             This Child psychotherapist will follow up with patient post discharge from the skilled nursing facility to assess for continued need for social work involvement.     [facility] CM Care Plan Problem One        Most Recent Value   Care Plan Problem One  patient is currently in a skilled nursing facility for rehabilitation   Role Documenting the Problem One  Clinical Social Worker   [facility] CM Short Term Goal #1 (0-30 days)  patient will actiively participate in physical and Occupational therapy for the next 30 days   [facility] CM Short Term Goal #1 Start Date  11/02/15   Interventions for Short Term Goal #1  This social worker encouraged  continued participation in physiical and occupatoinal therapy while in rehabilitation   [facility] CM Short Term Goal #2 (0-30 days)  Patient and social worker will work with the discharge planner to ensure a safe and stable discharge for the next 30 days   [facility] CM Short Term Goal #2 Start Date  11/02/15   Interventions for Short Term Goal #2  This social worker and patient spoke with discharge  planner regarding  patient's tentative discharge date of 11/07/15  with home health      Tonika Eden Lonn [facility] Care Management [phone]

## 2015-11-03 ENCOUNTER — Encounter: Payer: Self-pay | Admitting: *Deleted

## 2015-11-08 ENCOUNTER — Telehealth: Payer: Self-pay | Admitting: Family Medicine

## 2015-11-08 DIAGNOSIS — J209 Acute bronchitis, unspecified: Secondary | ICD-10-CM | POA: Diagnosis not present

## 2015-11-08 DIAGNOSIS — J440 Chronic obstructive pulmonary disease with acute lower respiratory infection: Secondary | ICD-10-CM | POA: Diagnosis not present

## 2015-11-08 DIAGNOSIS — I10 Essential (primary) hypertension: Secondary | ICD-10-CM | POA: Diagnosis not present

## 2015-11-08 DIAGNOSIS — G609 Hereditary and idiopathic neuropathy, unspecified: Secondary | ICD-10-CM | POA: Diagnosis not present

## 2015-11-08 DIAGNOSIS — G43909 Migraine, unspecified, not intractable, without status migrainosus: Secondary | ICD-10-CM | POA: Diagnosis not present

## 2015-11-08 DIAGNOSIS — I739 Peripheral vascular disease, unspecified: Secondary | ICD-10-CM | POA: Diagnosis not present

## 2015-11-08 DIAGNOSIS — I2510 Atherosclerotic heart disease of native coronary artery without angina pectoris: Secondary | ICD-10-CM | POA: Diagnosis not present

## 2015-11-08 DIAGNOSIS — F0390 Unspecified dementia without behavioral disturbance: Secondary | ICD-10-CM | POA: Diagnosis not present

## 2015-11-08 DIAGNOSIS — G894 Chronic pain syndrome: Secondary | ICD-10-CM | POA: Diagnosis not present

## 2015-11-08 NOTE — Telephone Encounter (Signed)
Please review i just this message in your box-aa

## 2015-11-08 NOTE — Telephone Encounter (Signed)
[name] from [facility] calling about Pt she states pt has 101 fever, pt is every drowsy, no chest pain, no burning when voiding, pt does have a non productive cough, pt does have a history of pneumonia, nurse did hear some crackling in pt's lungs. Vital signs- heart rate 100, BP 104/60. CB# [phone]

## 2015-11-09 ENCOUNTER — Emergency Department: Payer: Commercial Managed Care - HMO

## 2015-11-09 ENCOUNTER — Encounter: Payer: Self-pay | Admitting: Emergency Medicine

## 2015-11-09 ENCOUNTER — Inpatient Hospital Stay
Admission: EM | Admit: 2015-11-09 | Discharge: 2015-11-13 | DRG: 871 | Disposition: A | Discharge status: Skilled Nursing Facility | Payer: Commercial Managed Care - HMO | Attending: Internal Medicine | Admitting: Internal Medicine

## 2015-11-09 DIAGNOSIS — E440 Moderate protein-calorie malnutrition: Secondary | ICD-10-CM | POA: Insufficient documentation

## 2015-11-09 DIAGNOSIS — I2510 Atherosclerotic heart disease of native coronary artery without angina pectoris: Secondary | ICD-10-CM | POA: Diagnosis present

## 2015-11-09 DIAGNOSIS — J45909 Unspecified asthma, uncomplicated: Secondary | ICD-10-CM | POA: Diagnosis present

## 2015-11-09 DIAGNOSIS — Z886 Allergy status to analgesic agent status: Secondary | ICD-10-CM | POA: Diagnosis not present

## 2015-11-09 DIAGNOSIS — Z736 Limitation of activities due to disability: Secondary | ICD-10-CM | POA: Diagnosis not present

## 2015-11-09 DIAGNOSIS — A419 Sepsis, unspecified organism: Principal | ICD-10-CM | POA: Diagnosis present

## 2015-11-09 DIAGNOSIS — M6281 Muscle weakness (generalized): Secondary | ICD-10-CM | POA: Diagnosis not present

## 2015-11-09 DIAGNOSIS — Y95 Nosocomial condition: Secondary | ICD-10-CM | POA: Diagnosis present

## 2015-11-09 DIAGNOSIS — J449 Chronic obstructive pulmonary disease, unspecified: Secondary | ICD-10-CM | POA: Diagnosis not present

## 2015-11-09 DIAGNOSIS — R0902 Hypoxemia: Secondary | ICD-10-CM | POA: Diagnosis present

## 2015-11-09 DIAGNOSIS — J209 Acute bronchitis, unspecified: Secondary | ICD-10-CM | POA: Diagnosis not present

## 2015-11-09 DIAGNOSIS — K219 Gastro-esophageal reflux disease without esophagitis: Secondary | ICD-10-CM | POA: Diagnosis present

## 2015-11-09 DIAGNOSIS — E785 Hyperlipidemia, unspecified: Secondary | ICD-10-CM | POA: Diagnosis not present

## 2015-11-09 DIAGNOSIS — J440 Chronic obstructive pulmonary disease with acute lower respiratory infection: Secondary | ICD-10-CM | POA: Diagnosis present

## 2015-11-09 DIAGNOSIS — I739 Peripheral vascular disease, unspecified: Secondary | ICD-10-CM | POA: Diagnosis not present

## 2015-11-09 DIAGNOSIS — M810 Age-related osteoporosis without current pathological fracture: Secondary | ICD-10-CM | POA: Diagnosis present

## 2015-11-09 DIAGNOSIS — J189 Pneumonia, unspecified organism: Secondary | ICD-10-CM | POA: Diagnosis not present

## 2015-11-09 DIAGNOSIS — Z888 Allergy status to other drugs, medicaments and biological substances status: Secondary | ICD-10-CM | POA: Diagnosis not present

## 2015-11-09 DIAGNOSIS — Z9981 Dependence on supplemental oxygen: Secondary | ICD-10-CM

## 2015-11-09 DIAGNOSIS — R2681 Unsteadiness on feet: Secondary | ICD-10-CM | POA: Diagnosis not present

## 2015-11-09 DIAGNOSIS — Z885 Allergy status to narcotic agent status: Secondary | ICD-10-CM | POA: Diagnosis not present

## 2015-11-09 DIAGNOSIS — Z882 Allergy status to sulfonamides status: Secondary | ICD-10-CM

## 2015-11-09 DIAGNOSIS — F0390 Unspecified dementia without behavioral disturbance: Secondary | ICD-10-CM | POA: Diagnosis not present

## 2015-11-09 DIAGNOSIS — E559 Vitamin D deficiency, unspecified: Secondary | ICD-10-CM | POA: Diagnosis present

## 2015-11-09 DIAGNOSIS — G609 Hereditary and idiopathic neuropathy, unspecified: Secondary | ICD-10-CM | POA: Diagnosis not present

## 2015-11-09 DIAGNOSIS — R4182 Altered mental status, unspecified: Secondary | ICD-10-CM | POA: Diagnosis present

## 2015-11-09 DIAGNOSIS — F419 Anxiety disorder, unspecified: Secondary | ICD-10-CM | POA: Diagnosis present

## 2015-11-09 DIAGNOSIS — G894 Chronic pain syndrome: Secondary | ICD-10-CM | POA: Diagnosis present

## 2015-11-09 DIAGNOSIS — Z9181 History of falling: Secondary | ICD-10-CM | POA: Diagnosis not present

## 2015-11-09 DIAGNOSIS — G43909 Migraine, unspecified, not intractable, without status migrainosus: Secondary | ICD-10-CM | POA: Diagnosis not present

## 2015-11-09 DIAGNOSIS — F17210 Nicotine dependence, cigarettes, uncomplicated: Secondary | ICD-10-CM | POA: Diagnosis not present

## 2015-11-09 DIAGNOSIS — I10 Essential (primary) hypertension: Secondary | ICD-10-CM | POA: Diagnosis present

## 2015-11-09 DIAGNOSIS — Z79899 Other long term (current) drug therapy: Secondary | ICD-10-CM

## 2015-11-09 DIAGNOSIS — Z7902 Long term (current) use of antithrombotics/antiplatelets: Secondary | ICD-10-CM

## 2015-11-09 DIAGNOSIS — R509 Fever, unspecified: Secondary | ICD-10-CM | POA: Diagnosis not present

## 2015-11-09 LAB — CBC WITH DIFFERENTIAL/PLATELET
BASOS PCT: 1 %
Basophils Absolute: 0 10*3/uL (ref 0–0.1)
EOS ABS: 0 10*3/uL (ref 0–0.7)
EOS PCT: 0 %
HCT: 30.3 % — ABNORMAL LOW (ref 35.0–47.0)
HEMOGLOBIN: 9.9 g/dL — AB (ref 12.0–16.0)
LYMPHS ABS: 0.3 10*3/uL — AB (ref 1.0–3.6)
Lymphocytes Relative: 4 %
MCH: 30.6 pg (ref 26.0–34.0)
MCHC: 32.7 g/dL (ref 32.0–36.0)
MCV: 93.6 fL (ref 80.0–100.0)
MONO ABS: 0.3 10*3/uL (ref 0.2–0.9)
MONOS PCT: 4 %
Neutro Abs: 7.6 10*3/uL — ABNORMAL HIGH (ref 1.4–6.5)
Neutrophils Relative %: 91 %
PLATELETS: 294 10*3/uL (ref 150–440)
RBC: 3.23 MIL/uL — ABNORMAL LOW (ref 3.80–5.20)
RDW: 15.5 % — AB (ref 11.5–14.5)
WBC: 8.3 10*3/uL (ref 3.6–11.0)

## 2015-11-09 LAB — COMPREHENSIVE METABOLIC PANEL
ALK PHOS: 104 U/L (ref 38–126)
ALT: 10 U/L — AB (ref 14–54)
AST: 17 U/L (ref 15–41)
Albumin: 3.3 g/dL — ABNORMAL LOW (ref 3.5–5.0)
Anion gap: 9 (ref 5–15)
BUN: 16 mg/dL (ref 6–20)
CALCIUM: 8.3 mg/dL — AB (ref 8.9–10.3)
CHLORIDE: 98 mmol/L — AB (ref 101–111)
CO2: 29 mmol/L (ref 22–32)
CREATININE: 0.97 mg/dL (ref 0.44–1.00)
GFR calc non Af Amer: 60 mL/min — ABNORMAL LOW (ref >60)
GLUCOSE: 118 mg/dL — AB (ref 65–99)
Potassium: 3.3 mmol/L — ABNORMAL LOW (ref 3.5–5.1)
SODIUM: 136 mmol/L (ref 135–145)
Total Bilirubin: 0.5 mg/dL (ref 0.3–1.2)
Total Protein: 7 g/dL (ref 6.5–8.1)

## 2015-11-09 LAB — URINALYSIS COMPLETE WITH MICROSCOPIC (ARMC ONLY)
Bacteria, UA: NONE SEEN
Bilirubin Urine: NEGATIVE
GLUCOSE, UA: NEGATIVE mg/dL
Hgb urine dipstick: NEGATIVE
KETONES UR: NEGATIVE mg/dL
Leukocytes, UA: NEGATIVE
NITRITE: NEGATIVE
PROTEIN: NEGATIVE mg/dL
Specific Gravity, Urine: 1.012 (ref 1.005–1.030)
pH: 5 (ref 5.0–8.0)

## 2015-11-09 LAB — BLOOD GAS, ARTERIAL
ACID-BASE EXCESS: 5.2 mmol/L — AB (ref 0.0–3.0)
Allens test (pass/fail): POSITIVE — AB
BICARBONATE: 29.9 meq/L — AB (ref 21.0–28.0)
FIO2: 0.34
O2 Saturation: 94.6 %
PATIENT TEMPERATURE: 37
pCO2 arterial: 43 mmHg (ref 32.0–48.0)
pH, Arterial: 7.45 (ref 7.350–7.450)
pO2, Arterial: 70 mmHg — ABNORMAL LOW (ref 83.0–108.0)

## 2015-11-09 LAB — TROPONIN I: Troponin I: 0.04 ng/mL — ABNORMAL HIGH (ref <0.031)

## 2015-11-09 LAB — LIPASE, BLOOD: Lipase: 16 U/L (ref 11–51)

## 2015-11-09 LAB — LACTIC ACID, PLASMA: LACTIC ACID, VENOUS: 1.6 mmol/L (ref 0.5–2.0)

## 2015-11-09 MED ORDER — ONDANSETRON HCL 4 MG/2ML IJ SOLN: 4.0000 mg | Freq: Four times a day (QID) | INTRAMUSCULAR | Status: DC | PRN

## 2015-11-09 MED ORDER — SODIUM CHLORIDE 0.9 % IJ SOLN
3.0000 mL | Freq: Two times a day (BID) | INTRAMUSCULAR | Status: DC
  Administered 2015-11-09 – 2015-11-13 (×9): 3 mL via INTRAVENOUS

## 2015-11-09 MED ORDER — SODIUM CHLORIDE 0.9 % IV BOLUS (SEPSIS)
500.0000 mL | INTRAVENOUS | Status: AC
  Administered 2015-11-09: 500 mL via INTRAVENOUS

## 2015-11-09 MED ORDER — VANCOMYCIN HCL IN DEXTROSE 1-5 GM/200ML-% IV SOLN
1000.0000 mg | Freq: Once | INTRAVENOUS | Status: AC
  Administered 2015-11-09: 1000 mg via INTRAVENOUS
  Filled 2015-11-09: qty 200

## 2015-11-09 MED ORDER — SODIUM CHLORIDE 0.9 % IV BOLUS (SEPSIS)
1000.0000 mL | Freq: Once | INTRAVENOUS | Status: AC
  Administered 2015-11-09: 1000 mL via INTRAVENOUS

## 2015-11-09 MED ORDER — ONDANSETRON HCL 4 MG PO TABS: 4.0000 mg | ORAL_TABLET | Freq: Four times a day (QID) | ORAL | Status: DC | PRN

## 2015-11-09 MED ORDER — LEVOFLOXACIN IN D5W 250 MG/50ML IV SOLN
250.0000 mg | Freq: Once | INTRAVENOUS | Status: AC
  Administered 2015-11-10: 250 mg via INTRAVENOUS
  Filled 2015-11-09: qty 50

## 2015-11-09 MED ORDER — PIPERACILLIN-TAZOBACTAM 4.5 G IVPB
4.5000 g | Freq: Three times a day (TID) | INTRAVENOUS | Status: DC
  Filled 2015-11-09 (×3): qty 100

## 2015-11-09 MED ORDER — PIPERACILLIN-TAZOBACTAM 3.375 G IVPB
3.3750 g | Freq: Once | INTRAVENOUS | Status: AC
  Administered 2015-11-09: 3.375 g via INTRAVENOUS
  Filled 2015-11-09: qty 50

## 2015-11-09 MED ORDER — LEVOFLOXACIN IN D5W 750 MG/150ML IV SOLN
750.0000 mg | INTRAVENOUS | Status: DC
  Administered 2015-11-11: 750 mg via INTRAVENOUS
  Filled 2015-11-09: qty 150

## 2015-11-09 MED ORDER — HEPARIN SODIUM (PORCINE) 5000 UNIT/ML IJ SOLN
5000.0000 [IU] | Freq: Three times a day (TID) | INTRAMUSCULAR | Status: DC
  Administered 2015-11-10 – 2015-11-13 (×10): 5000 [IU] via SUBCUTANEOUS
  Filled 2015-11-09 (×10): qty 1

## 2015-11-09 MED ORDER — ACETAMINOPHEN 650 MG RE SUPP: 650.0000 mg | Freq: Four times a day (QID) | RECTAL | Status: DC | PRN

## 2015-11-09 MED ORDER — ACETAMINOPHEN 325 MG PO TABS
650.0000 mg | ORAL_TABLET | Freq: Four times a day (QID) | ORAL | Status: DC | PRN
  Administered 2015-11-11 – 2015-11-12 (×2): 650 mg via ORAL
  Filled 2015-11-09 (×2): qty 2

## 2015-11-09 MED ORDER — SODIUM CHLORIDE 0.9 % IV SOLN
INTRAVENOUS | Status: DC
  Administered 2015-11-10 – 2015-11-11 (×5): via INTRAVENOUS

## 2015-11-09 MED ORDER — LEVOFLOXACIN IN D5W 500 MG/100ML IV SOLN
500.0000 mg | Freq: Once | INTRAVENOUS | Status: AC
  Administered 2015-11-09: 500 mg via INTRAVENOUS
  Filled 2015-11-09: qty 100

## 2015-11-09 NOTE — H&P (Signed)
[facility] - [place] at [facility]   PATIENT NAME: Cheryl [name]    MR#:  [id number]  DATE OF BIRTH:  February 18, 1951   DATE OF ADMISSION:  11/09/2015  PRIMARY CARE PHYSICIAN: Mila Merry, MD   REQUESTING/REFERRING PHYSICIAN: Cyril Loosen  CHIEF COMPLAINT:   Chief Complaint  Patient presents with  . Code Sepsis    HISTORY OF PRESENT ILLNESS:  Cheryl [name]  is a 64 y.o. female with a known history of COPD oxygen requiring 2 L nasal cannula at nighttime discharge approximately 10 days ago discharge diagnosis pneumonia. Majority of history obtained from husband who is present at bedside states since discharge and has been having intermittent fevers however now with MAXIMUM TEMPERATURE 105. She has been experiencing some low oxygen saturations but no cough or further localizing symptomatology. In the emergency department noted to be febrile as well as hypoxic. Chest x-ray findings consistent with right lower lobe pneumonia. On most recent x-ray of this actually resolved-thus indicating a new finding  PAST MEDICAL HISTORY:   Past Medical History  Diagnosis Date  . COPD (chronic obstructive pulmonary disease) (HCC)   . Hypertension   . Arthritis   . Hyperlipidemia   . GERD (gastroesophageal reflux disease)   . Neuropathy (HCC) 2010  . Oxygen deficiency   . Coronary artery disease   . Headache   . Anxiety   . Peripheral vascular disease (HCC)   . Vitamin D deficiency   . Migraines   . Chronic pain   . DVT (deep venous thrombosis) (HCC)   . Pneumonia   . Osteoporosis   . Allergy   . Asthma     PAST SURGICAL HISTORY:   Past Surgical History  Procedure Laterality Date  . Appendectomy    . Spine surgery    . Foot surgery Bilateral     5-6 years per patient  . Cardiac catherization  10/31/2009  . Abdomnal aortic stent  05/30/2008    Dr. Nanetta Batty  . Abdominal hysterectomy  1975    Bilaterl Oophorectomy; Dur to IUD infection  . Cholecystectomy   1972  . Cervical fusion  C5 - 6/C6-7  . Appendectomy    . Colonoscopy with propofol N/A 07/27/2015    Procedure: COLONOSCOPY WITH PROPOFOL;  Surgeon: Wallace Cullens, MD;  Location: [facility] ENDOSCOPY;  Service: Gastroenterology;  Laterality: N/A;  . Esophagogastroduodenoscopy (egd) with propofol N/A 07/27/2015    Procedure: ESOPHAGOGASTRODUODENOSCOPY (EGD) WITH PROPOFOL;  Surgeon: Wallace Cullens, MD;  Location: [facility] ENDOSCOPY;  Service: Gastroenterology;  Laterality: N/A;    SOCIAL HISTORY:   Social History  Substance Use Topics  . Smoking status: Current Every Day Smoker -- 1.00 packs/day for 50 years    Types: Cigarettes  . Smokeless tobacco: Not on file  . Alcohol Use: No    FAMILY HISTORY:   Family History  Problem Relation Age of Onset  . Cancer Mother   . Arthritis Mother   . Heart disease Mother   . Diabetes Mother     mellitus, type 2  . Heart disease Father   . Diabetes Sister   . Cancer Brother   . Cancer Brother     lung  . Diabetes Brother     DRUG ALLERGIES:   Allergies  Allergen Reactions  . Percocet [Oxycodone-Acetaminophen] Hives  . Aspirin Nausea And Vomiting and Other (See Comments)    Reaction:  GI upset   . Codeine Nausea And Vomiting and Other (See Comments)  Reaction:  GI upset   . Morphine Other (See Comments)    Reaction:  Unknown   . Pregabalin Other (See Comments)    Reaction:  Makes pt sleep excessively.    . Propoxyphene Other (See Comments)    Reaction:  GI upset   . Sulfa Antibiotics Rash and Other (See Comments)    Reaction:  GI upset     REVIEW OF SYSTEMS:  REVIEW OF SYSTEMS:  CONSTITUTIONAL: Positive fevers, chills, fatigue, weakness.  EYES: Denies blurred vision, double vision, or eye pain.  EARS, NOSE, THROAT: Denies tinnitus, ear pain, hearing loss.  RESPIRATORY: denies cough, positive shortness of breath, denies wheezing  CARDIOVASCULAR: Denies chest pain, palpitations, edema.  GASTROINTESTINAL: Positive nausea, vomiting, denies  diarrhea, abdominal pain.  GENITOURINARY: Denies dysuria, hematuria.  ENDOCRINE: Denies nocturia or thyroid problems. HEMATOLOGIC AND LYMPHATIC: Denies easy bruising or bleeding.  SKIN: Denies rash or lesions.  MUSCULOSKELETAL: Denies pain in neck, back, shoulder, knees, hips, or further arthritic symptoms.  NEUROLOGIC: Denies paralysis, paresthesias.  PSYCHIATRIC: Denies anxiety or depressive symptoms. Otherwise full review of systems performed by me is negative.   MEDICATIONS AT HOME:   Prior to Admission medications   Medication Sig Start Date End Date Taking? Authorizing Provider  acetaminophen (TYLENOL) 500 MG tablet Take 1,000 mg by mouth every 6 (six) hours as needed for mild pain or headache.    Yes Historical Provider, MD  albuterol (PROVENTIL HFA;VENTOLIN HFA) 108 (90 BASE) MCG/ACT inhaler Inhale 2 puffs into the lungs every 4 (four) hours as needed for wheezing or shortness of breath. 08/11/15  Yes Malva Limesonald E Fisher, MD  albuterol (PROVENTIL) (2.5 MG/3ML) 0.083% nebulizer solution Take 3 mLs (2.5 mg total) by nebulization every 4 (four) hours as needed for wheezing. 10/12/15  Yes Katharina Caperima Vaickute, MD  ALPRAZolam Prudy Feeler(XANAX) 0.5 MG tablet Take 0.5 mg by mouth every 4 (four) hours as needed for anxiety.    Yes Historical Provider, MD  benzonatate (TESSALON) 200 MG capsule Take 1 capsule (200 mg total) by mouth 3 (three) times daily as needed for cough. 10/22/15  Yes Delfino LovettVipul Shah, MD  celecoxib (CELEBREX) 200 MG capsule Take 200 mg by mouth daily.   Yes Historical Provider, MD  clopidogrel (PLAVIX) 75 MG tablet Take 75 mg by mouth daily.   Yes Historical Provider, MD  diphenoxylate-atropine (LOMOTIL) 2.5-0.025 MG per tablet Take 2 tablets by mouth 4 (four) times daily as needed for diarrhea or loose stools.    Yes Historical Provider, MD  donepezil (ARICEPT) 10 MG tablet Take 10 mg by mouth at bedtime.   Yes Historical Provider, MD  estropipate (OGEN) 0.75 MG tablet Take 1 tablet (0.75 mg total)  by mouth daily. 10/23/15  Yes Malva Limesonald E Fisher, MD  fentaNYL (DURAGESIC - DOSED MCG/HR) 50 MCG/HR Place 1 patch (50 mcg total) onto the skin every 3 (three) days. 09/04/15  Yes Delano MetzFrancisco Naveira, MD  Fluticasone-Salmeterol (ADVAIR) 250-50 MCG/DOSE AEPB Inhale 1 puff into the lungs 2 (two) times daily.   Yes Historical Provider, MD  furosemide (LASIX) 40 MG tablet Take 40 mg by mouth daily.   Yes Historical Provider, MD  guaiFENesin-dextromethorphan (ROBITUSSIN DM) 100-10 MG/5ML syrup Take 5 mLs by mouth every 4 (four) hours as needed for cough. 10/22/15  Yes Vipul Sherryll BurgerShah, MD  mupirocin cream (BACTROBAN) 2 % Apply 1 application topically 2 (two) times daily.    Yes Historical Provider, MD  pantoprazole (PROTONIX) 40 MG tablet Take 40 mg by mouth daily.    Yes  Historical Provider, MD  pregabalin (LYRICA) 150 MG capsule Take 1 capsule (150 mg total) by mouth 2 (two) times daily. 09/04/15  Yes Delano Metz, MD  silver sulfADIAZINE (SILVADENE) 1 % cream Apply 1 application topically daily.    Yes Historical Provider, MD  simvastatin (ZOCOR) 10 MG tablet Take 10 mg by mouth at bedtime.    Yes Historical Provider, MD  SUMAtriptan (IMITREX) 25 MG tablet Take 25 mg by mouth as needed for migraine. May repeat in 2 hours if headache persists or recurs.   Yes Historical Provider, MD  tiotropium (SPIRIVA) 18 MCG inhalation capsule Place 1 capsule (18 mcg total) into inhaler and inhale daily. 10/25/15  Yes Malva Limes, MD  traMADol (ULTRAM) 50 MG tablet Take 100 mg by mouth 4 (four) times daily.   Yes Historical Provider, MD  amoxicillin-clavulanate (AUGMENTIN) 875-125 MG tablet Take 1 tablet by mouth 2 (two) times daily. Patient not taking: Reported on 11/09/2015 10/22/15   Delfino Lovett, MD      VITAL SIGNS:  Blood pressure 101/49, pulse 86, temperature 101.3 F (38.5 C), temperature source Rectal, resp. rate 20, height  (1.575 m), weight 100 lb (45.36 kg), SpO2 93 %.  PHYSICAL EXAMINATION:  VITAL  SIGNS: Filed Vitals:   11/09/15 2230 11/09/15 2300  BP: 93/46 101/49  Pulse: 91 86  Temp: 101.6 F (38.7 C) 101.3 F (38.5 C)  Resp: 16 20   GENERAL:64 y.o.female currently in no acute distress. Chronically ill-appearing HEAD: Normocephalic, atraumatic.  EYES: Pupils equal, round, reactive to light. Extraocular muscles intact. No scleral icterus.  MOUTH: Moist mucosal membrane. Dentition intact. No abscess noted.  EAR, NOSE, THROAT: Clear without exudates. No external lesions.  NECK: Supple. No thyromegaly. No nodules. No JVD.  PULMONARY: Right lower lobe coarse rhonchiwithout wheeze rails . No use of accessory muscles, Good respiratory effort. good air entry bilaterally CHEST: Nontender to palpation.  CARDIOVASCULAR: S1 and S2. Regular rate and rhythm. No murmurs, rubs, or gallops. No edema. Pedal pulses 2+ bilaterally.  GASTROINTESTINAL: Soft, nontender, nondistended. No masses. Positive bowel sounds. No hepatosplenomegaly.  MUSCULOSKELETAL: No swelling, clubbing, or edema. Range of motion full in all extremities.  NEUROLOGIC: Cranial nerves II through XII are intact. No gross focal neurological deficits. Sensation intact. Reflexes intact.  SKIN: No ulceration, lesions, rashes, or cyanosis. Skin warm and dry. Turgor intact.  PSYCHIATRIC: Mood, affect flattened. The patient is awake, alert and oriented x 3. Insight, judgment intact.    LABORATORY PANEL:   CBC  Recent Labs Lab 11/09/15 2044  WBC 8.3  HGB 9.9*  HCT 30.3*  PLT 294   ------------------------------------------------------------------------------------------------------------------  Chemistries   Recent Labs Lab 11/09/15 2044  NA 136  K 3.3*  CL 98*  CO2 29  GLUCOSE 118*  BUN 16  CREATININE 0.97  CALCIUM 8.3*  AST 17  ALT 10*  ALKPHOS 104  BILITOT 0.5   ------------------------------------------------------------------------------------------------------------------  Cardiac Enzymes  Recent  Labs Lab 11/09/15 2044  TROPONINI 0.04*   ------------------------------------------------------------------------------------------------------------------  RADIOLOGY:  Dg Chest Port 1 View  11/09/2015  CLINICAL DATA:  Recently discharged from hospital for pneumonia. Vomiting at home. Patient is been lethargic and a time date. Responsive to pain but nonverbal. Fever. EXAM: PORTABLE CHEST 1 VIEW COMPARISON:  10/20/2015 FINDINGS: Cardiac enlargement without significant vascular congestion. Airspace disease in the right lower lung consistent with pneumonia. Linear atelectasis in the left mid lung. No blunting of costophrenic angles. No pneumothorax. Calcified and tortuous aorta. Mediastinal contours appear intact. IMPRESSION:  Cardiac enlargement with mild pulmonary vascular congestion. Airspace disease in the right lower lung consistent with pneumonia. Electronically Signed   By: Burman Nieves M.D.   On: 11/09/2015 20:58    EKG:   Orders placed or performed during the hospital encounter of 11/09/15  . EKG 12-Lead  . EKG 12-Lead    IMPRESSION AND PLAN:   63 year old Caucasian female history of COPD oxygen requiring presenting with fever  1.Sepsis, meeting septic criteria by heart rate, respiratory rate, temperature present on arrival. Source healthcare associated pneumonia Code sepsis initiated. Panculture. Broad-spectrum antibiotics including vancomycin/Zosyn/Levaquin and taper antibiotics when culture data returns. He has received a 30 mL/kg IV fluid bolus. Continue IV fluid hydration to keep mean arterial pressure greater than 65. He may require pressor therapy if blood pressure worsens. Supplemental oxygen keep O2 saturations greater than 88% add breathing treatments. Given this appears to be a recurrent right lower lobe pneumonia the patient and family deny any symptoms suggestive of aspiration however will obtain formal swallow evaluation when she is more alert 2. COPD not in acute  exacerbation, no wheezing: Continue with oxygen as above as well as home medications 3. GERD without esophagitis PPI therapy 4. Hyperlipidemia unspecified statin therapy 5. Venous thromboembolism prophylactic: Heparin subcutaneous    All the records are reviewed and case discussed with ED provider. Management plans discussed with the patient, family and they are in agreement.  CODE STATUS: Full  TOTAL TIME TAKING CARE OF THIS PATIENT: 50 minutes.    Hower,  Mardi Mainland.D on 11/09/2015 at 11:22 PM  Between 7am to 6pm - Pager - [phone]  After 6pm: House Pager: - [phone]  [facility]  Office  [phone]  CC: Primary care physician; Mila Merry, MD

## 2015-11-09 NOTE — ED Provider Notes (Signed)
[facility] Emergency Department Provider Note  ____________________________________________  Time seen: On arrival  I have reviewed the triage vital signs and the nursing notes.   HISTORY  Chief Complaint Code Sepsis  Patient with altered mental status, history is limited  HPI Cheryl [name] is a 64 y.o. female who presents with altered mental status and fever.Her son patient has had decreased responsiveness today. Recently discharged from rehabilitation. He checked her temperature it was 105 at home. Patient has  multiple comorbidities as detailed below. Recent admission at [facility] for sepsis     Past Medical History  Diagnosis Date  . COPD (chronic obstructive pulmonary disease) (HCC)   . Hypertension   . Arthritis   . Hyperlipidemia   . GERD (gastroesophageal reflux disease)   . Neuropathy (HCC) 2010  . Oxygen deficiency   . Coronary artery disease   . Headache   . Anxiety   . Peripheral vascular disease (HCC)   . Vitamin D deficiency   . Migraines   . Chronic pain   . DVT (deep venous thrombosis) (HCC)   . Pneumonia   . Osteoporosis   . Allergy   . Asthma     Patient Active Problem List   Diagnosis Date Noted  . Sepsis (HCC) 10/17/2015  . Abnormal mammogram of right breast 10/11/2015  . Acute bronchitis 10/11/2015  . Dilated intrahepatic bile duct 10/11/2015  . Anemia 10/11/2015  . Abdominal pain 10/11/2015  . Acute delirium 10/10/2015  . Altered mental status 10/09/2015  . Lumbar radicular pain 09/04/2015  . Chronic low back pain 09/04/2015  . Other long term (current) drug therapy 09/04/2015  . Encounter for long-term opiate analgesic use 09/04/2015  . Encounter for therapeutic drug level monitoring 09/04/2015  . Opiate use 09/04/2015  . Uncomplicated opioid dependence (HCC) 09/04/2015  . Chronic pain syndrome 09/04/2015  . Platelet inhibition due to Plavix 09/04/2015  . COPD (chronic obstructive pulmonary disease)  (HCC) 07/31/2015  . Dementia 07/31/2015  . Gastrointestinal complaints, nonspecific 07/31/2015  . Absolute anemia 07/03/2015  . Airway hyperreactivity 07/03/2015  . Back pain, thoracic 07/03/2015  . Diaphoresis 07/03/2015  . Excessive falling 07/03/2015  . Alteration in bowel elimination: incontinence 07/03/2015  . Insomnia 07/03/2015  . Decreased testosterone level 07/03/2015  . Leg weakness 07/03/2015  . Menopausal symptom 07/03/2015  . Migraine 07/03/2015  . Neuropathy (HCC) 07/03/2015  . Fecal occult blood test positive 07/03/2015  . OP (osteoporosis) 07/03/2015  . Episodic paroxysmal anxiety disorder 07/03/2015  . Episode of syncope 07/03/2015  . Compulsive tobacco user syndrome 07/03/2015  . Urinary incontinence 07/03/2015  . Weight loss 07/03/2015  . Essential hypertension 06/06/2015  . GERD (gastroesophageal reflux disease) 06/06/2015  . Hyperlipemia 06/06/2015  . Disorder of peripheral nervous system (HCC) 04/13/2014  . 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 Anxiety 02/10/2014  . Coronary artery disease 02/10/2014  . Peripheral blood vessel disorder (HCC) 02/10/2014  . Hypercholesteremia 02/10/2014  . Peripheral vascular disease (HCC) 02/10/2014  . Vitamin D deficiency 10/16/2009    Past Surgical History  Procedure Laterality Date  . Appendectomy    . Spine surgery    . Foot surgery Bilateral     5-6 years per patient  . Cardiac catherization  10/31/2009  . Abdomnal aortic stent  05/30/2008    Dr. Nanetta Batty  . Abdominal hysterectomy  1975    Bilaterl Oophorectomy; Dur to IUD infection  . Cholecystectomy  1972  . Cervical fusion  C5 - 6/C6-7  .  Appendectomy    . Colonoscopy with propofol N/A 07/27/2015    Procedure: COLONOSCOPY WITH PROPOFOL;  Surgeon: Wallace Cullens, MD;  Location: [facility] ENDOSCOPY;  Service: Gastroenterology;  Laterality: N/A;  . Esophagogastroduodenoscopy (egd) with propofol N/A 07/27/2015    Procedure: ESOPHAGOGASTRODUODENOSCOPY (EGD)  WITH PROPOFOL;  Surgeon: Wallace Cullens, MD;  Location: [facility] ENDOSCOPY;  Service: Gastroenterology;  Laterality: N/A;    Current Outpatient Rx  Name  Route  Sig  Dispense  Refill  . acetaminophen (TYLENOL) 500 MG tablet   Oral   Take 1,000 mg by mouth every 6 (six) hours as needed for mild pain or headache.          . albuterol (PROVENTIL HFA;VENTOLIN HFA) 108 (90 BASE) MCG/ACT inhaler   Inhalation   Inhale 2 puffs into the lungs every 4 (four) hours as needed for wheezing or shortness of breath.   18 g   3   . albuterol (PROVENTIL) (2.5 MG/3ML) 0.083% nebulizer solution   Nebulization   Take 3 mLs (2.5 mg total) by nebulization every 4 (four) hours as needed for wheezing.   75 mL   12   . ALPRAZolam (XANAX) 0.5 MG tablet   Oral   Take 0.5 mg by mouth every 6 (six) hours as needed for anxiety.         Marland Kitchen amoxicillin-clavulanate (AUGMENTIN) 875-125 MG tablet   Oral   Take 1 tablet by mouth 2 (two) times daily.   14 tablet   0   . benzonatate (TESSALON) 200 MG capsule   Oral   Take 1 capsule (200 mg total) by mouth 3 (three) times daily as needed for cough.   20 capsule   0   . celecoxib (CELEBREX) 200 MG capsule   Oral   Take 200 mg by mouth daily.         . clopidogrel (PLAVIX) 75 MG tablet   Oral   Take 75 mg by mouth daily.         . diphenoxylate-atropine (LOMOTIL) 2.5-0.025 MG per tablet   Oral   Take 2 tablets by mouth 2 (two) times daily as needed for diarrhea or loose stools.          . donepezil (ARICEPT) 10 MG tablet   Oral   Take 10 mg by mouth at bedtime.         Marland Kitchen estropipate (OGEN) 0.75 MG tablet   Oral   Take 1 tablet (0.75 mg total) by mouth daily.   30 tablet   6   . fentaNYL (DURAGESIC - DOSED MCG/HR) 50 MCG/HR   Transdermal   Place 1 patch (50 mcg total) onto the skin every 3 (three) days.   10 patch   0     Do not place this medication, or any other prescri ...   . ferrous sulfate 325 (65 FE) MG tablet   Oral   Take 325 mg  by mouth daily.          . Fluticasone-Salmeterol (ADVAIR) 250-50 MCG/DOSE AEPB   Inhalation   Inhale 1 puff into the lungs 2 (two) times daily.         . furosemide (LASIX) 40 MG tablet   Oral   Take 40 mg by mouth daily.         Marland Kitchen guaiFENesin-dextromethorphan (ROBITUSSIN DM) 100-10 MG/5ML syrup   Oral   Take 5 mLs by mouth every 4 (four) hours as needed for cough.   118 mL  0   . mupirocin cream (BACTROBAN) 2 %   Topical   Apply 1 application topically 3 (three) times daily.         . pantoprazole (PROTONIX) 40 MG tablet   Oral   Take 40 mg by mouth 2 (two) times daily.         . pregabalin (LYRICA) 150 MG capsule   Oral   Take 1 capsule (150 mg total) by mouth 2 (two) times daily.   60 capsule   2     Do not place this medication, or any other prescri ...   . silver sulfADIAZINE (SILVADENE) 1 % cream   Topical   Apply 1 application topically daily as needed (for redness on legs).          . simvastatin (ZOCOR) 10 MG tablet   Oral   Take 10 mg by mouth at bedtime.          . SUMAtriptan (IMITREX) 25 MG tablet   Oral   Take 25 mg by mouth as needed for migraine. May repeat in 2 hours if headache persists or recurs.         Marland Kitchen tiotropium (SPIRIVA) 18 MCG inhalation capsule   Inhalation   Place 1 capsule (18 mcg total) into inhaler and inhale daily.   30 capsule   12   . traMADol (ULTRAM) 50 MG tablet   Oral   Take 100 mg by mouth 4 (four) times daily.           Allergies Percocet; Aspirin; Codeine; Morphine; Pregabalin; Propoxyphene; and Sulfa antibiotics  Family History  Problem Relation Age of Onset  . Cancer Mother   . Arthritis Mother   . Heart disease Mother   . Diabetes Mother     mellitus, type 2  . Heart disease Father   . Diabetes Sister   . Cancer Brother   . Cancer Brother     lung  . Diabetes Brother     Social History Social History  Substance Use Topics  . Smoking status: Current Every Day Smoker -- 1.00  packs/day for 50 years    Types: Cigarettes  . Smokeless tobacco: None  . Alcohol Use: No    Level V caveat: Unable to obtain review of systems Review of Systems due to altered mental status     ____________________________________________   PHYSICAL EXAM:  VITAL SIGNS: ED Triage Vitals  Enc Vitals Group     BP 11/09/15 2038 123/49 mmHg     Pulse Rate 11/09/15 2038 114     Resp 11/09/15 2038 20     Temp 11/09/15 2038 104.9 F (40.5 C)     Temp Source 11/09/15 2038 Rectal     SpO2 11/09/15 2038 94 %     Weight 11/09/15 2038 100 lb (45.36 kg)     Height 11/09/15 2038 5\' 2"  (1.575 m)     Head Cir --      Peak Flow --      Pain Score 11/09/15 2039 0     Pain Loc --      Pain Edu? --      Excl. in GC? --     Constitutional: Ill-appearing, does follow commands but is very slow to respond Eyes: Conjunctivae are normal.  ENT   Head: Normocephalic and atraumatic.   Mouth/Throat: Mucous membranes are moist. Cardiovascular: Tachycardia, regular rhythm. Normal and symmetric distal pulses are present in all extremities. No murmurs, rubs, or gallops. Respiratory: Tachypnea,  breath sounds with scattered wheezes  bilaterally, Rales right lower lung Gastrointestinal: Soft and non-tender in all quadrants. No distention. There is no CVA tenderness. Genitourinary: deferred Musculoskeletal: Nontender with normal range of motion in all extremities. No lower extremity tenderness nor edema. Neurologic:  Patient opens eyes and my request but is unable to give history. She appears to move all extremities Skin:  Skin is warm, dry and intact. No rash noted. Psychiatric: Unable to examine  ____________________________________________    LABS (pertinent positives/negatives)  Labs Reviewed  COMPREHENSIVE METABOLIC PANEL - Abnormal; Notable for the following:    Potassium 3.3 (*)    Chloride 98 (*)    Glucose, Bld 118 (*)    Calcium 8.3 (*)    Albumin 3.3 (*)    ALT 10 (*)    GFR  calc non Af Amer 60 (*)    All other components within normal limits  CBC WITH DIFFERENTIAL/PLATELET - Abnormal; Notable for the following:    RBC 3.23 (*)    Hemoglobin 9.9 (*)    HCT 30.3 (*)    RDW 15.5 (*)    Neutro Abs 7.6 (*)    Lymphs Abs 0.3 (*)    All other components within normal limits  URINALYSIS COMPLETEWITH MICROSCOPIC ([facility] ONLY) - Abnormal; Notable for the following:    Color, Urine YELLOW (*)    APPearance CLEAR (*)    Squamous Epithelial / LPF 0-5 (*)    All other components within normal limits  TROPONIN I - Abnormal; Notable for the following:    Troponin I 0.04 (*)    All other components within normal limits  BLOOD GAS, ARTERIAL - Abnormal; Notable for the following:    pO2, Arterial 70 (*)    Bicarbonate 29.9 (*)    Acid-Base Excess 5.2 (*)    Allens test (pass/fail) POSITIVE (*)    All other components within normal limits  CULTURE, BLOOD (ROUTINE X 2)  CULTURE, BLOOD (ROUTINE X 2)  URINE CULTURE  LACTIC ACID, PLASMA  LIPASE, BLOOD  LACTIC ACID, PLASMA    ____________________________________________   EKG  ED ECG REPORT I, Jene Every, the attending physician, personally viewed and interpreted this ECG.   Date: 11/09/2015  EKG Time: 8:30 PM  Rate: 114  Rhythm: sinus tachycardia  Axis: Right axis deviation  Intervals:none  ST&T Change: ST depression inferior and laterally   ____________________________________________    RADIOLOGY I have personally reviewed any xrays that were ordered on this patient: Chest x-ray with new right lower lobe pneumonia  ____________________________________________   PROCEDURES  Procedure(s) performed: none  Critical Care performed: yes  CRITICAL CARE Performed by: Jene Every   Total critical care time:30 minutes  Critical care time was exclusive of separately billable procedures and treating other patients.  Critical care was necessary to treat or prevent imminent or life-threatening  deterioration.  Critical care was time spent personally by me on the following activities: development of treatment plan with patient and/or surrogate as well as nursing, discussions with consultants, evaluation of patient's response to treatment, examination of patient, obtaining history from patient or surrogate, ordering and performing treatments and interventions, ordering and review of laboratory studies, ordering and review of radiographic studies, pulse oximetry and re-evaluation of patient's condition.   ____________________________________________   INITIAL IMPRESSION / ASSESSMENT AND PLAN / ED COURSE  Pertinent labs & imaging results that were available during my care of the patient were reviewed by me and considered in my medical decision making (see  chart for details).  Patient presents with altered mental status, high fever. Code sepsis called on her arrival, broad-spectrum triple antibiotics for suspected HCAP. 30 ml/kg IVF. Apparently recently discharged from rehabilitation and was admitted at [facility] several weeks ago for sepsis. Blood cultures lactate urine culture sent prior to IV antibiotics.  Chest x-ray consistent with pneumonia as expected. Cultures pending  ____________________________________________   FINAL CLINICAL IMPRESSION(S) / ED DIAGNOSES  Final diagnoses:  Healthcare-associated pneumonia  Sepsis, due to unspecified organism [facility](HCC)     Jene Everyobert Keghan Mcfarren, MD 11/09/15 2203

## 2015-11-09 NOTE — ED Notes (Addendum)
  50 mcg/hr Fentanyl patch removed from patient's back  and disposed of in sharps container , patch is 683 days old.  EDP notified.

## 2015-11-09 NOTE — ED Notes (Addendum)
  Elevated troponin result received from lab: 0.04  Dr. Phineas RealKenner notified.

## 2015-11-09 NOTE — ED Notes (Signed)
Recent dc from hospital for pneumonia, family states she threw up at home and has been lethargic/obtunded since that time.  Patient is responsive to pain at this time but is non-verbal.  Rectal temp 104.9.  Dr. Phineas RealKenner notified, code sepsis called.  Family states patient does not want to be intubated.

## 2015-11-10 ENCOUNTER — Other Ambulatory Visit: Payer: Self-pay | Admitting: Pain Medicine

## 2015-11-10 DIAGNOSIS — E440 Moderate protein-calorie malnutrition: Secondary | ICD-10-CM | POA: Insufficient documentation

## 2015-11-10 LAB — CBC
HCT: 27.9 % — ABNORMAL LOW (ref 35.0–47.0)
HEMOGLOBIN: 8.9 g/dL — AB (ref 12.0–16.0)
MCH: 29.9 pg (ref 26.0–34.0)
MCHC: 32 g/dL (ref 32.0–36.0)
MCV: 93.5 fL (ref 80.0–100.0)
PLATELETS: 252 10*3/uL (ref 150–440)
RBC: 2.98 MIL/uL — AB (ref 3.80–5.20)
RDW: 15.2 % — ABNORMAL HIGH (ref 11.5–14.5)
WBC: 10.1 10*3/uL (ref 3.6–11.0)

## 2015-11-10 LAB — BASIC METABOLIC PANEL
ANION GAP: 8 (ref 5–15)
BUN: 14 mg/dL (ref 6–20)
CALCIUM: 7.4 mg/dL — AB (ref 8.9–10.3)
CO2: 27 mmol/L (ref 22–32)
Chloride: 104 mmol/L (ref 101–111)
Creatinine, Ser: 0.85 mg/dL (ref 0.44–1.00)
Glucose, Bld: 109 mg/dL — ABNORMAL HIGH (ref 65–99)
Potassium: 3.6 mmol/L (ref 3.5–5.1)
SODIUM: 139 mmol/L (ref 135–145)

## 2015-11-10 LAB — MRSA PCR SCREENING: MRSA BY PCR: NEGATIVE

## 2015-11-10 LAB — LACTIC ACID, PLASMA: Lactic Acid, Venous: 1 mmol/L (ref 0.5–2.0)

## 2015-11-10 MED ORDER — PANTOPRAZOLE SODIUM 40 MG PO TBEC
40.0000 mg | DELAYED_RELEASE_TABLET | Freq: Every day | ORAL | Status: DC
  Administered 2015-11-10 – 2015-11-13 (×4): 40 mg via ORAL
  Filled 2015-11-10 (×4): qty 1

## 2015-11-10 MED ORDER — PIPERACILLIN-TAZOBACTAM 3.375 G IVPB
3.3750 g | Freq: Three times a day (TID) | INTRAVENOUS | Status: DC
  Filled 2015-11-10 (×2): qty 50

## 2015-11-10 MED ORDER — MOMETASONE FURO-FORMOTEROL FUM 100-5 MCG/ACT IN AERO
2.0000 | INHALATION_SPRAY | Freq: Two times a day (BID) | RESPIRATORY_TRACT | Status: DC
  Administered 2015-11-10 – 2015-11-13 (×7): 2 via RESPIRATORY_TRACT
  Filled 2015-11-10: qty 8.8

## 2015-11-10 MED ORDER — ESTROPIPATE 0.75 MG PO TABS
0.7500 mg | ORAL_TABLET | Freq: Every day | ORAL | Status: DC
  Administered 2015-11-10: 08:00:00 via ORAL
  Administered 2015-11-11 – 2015-11-13 (×3): 0.75 mg via ORAL
  Filled 2015-11-10 (×4): qty 1

## 2015-11-10 MED ORDER — CLOPIDOGREL BISULFATE 75 MG PO TABS
75.0000 mg | ORAL_TABLET | Freq: Every day | ORAL | Status: DC
  Administered 2015-11-10 – 2015-11-13 (×4): 75 mg via ORAL
  Filled 2015-11-10 (×4): qty 1

## 2015-11-10 MED ORDER — DONEPEZIL HCL 5 MG PO TABS
10.0000 mg | ORAL_TABLET | Freq: Every day | ORAL | Status: DC
  Administered 2015-11-10 – 2015-11-12 (×3): 10 mg via ORAL
  Filled 2015-11-10 (×3): qty 2

## 2015-11-10 MED ORDER — TIOTROPIUM BROMIDE MONOHYDRATE 18 MCG IN CAPS
18.0000 ug | ORAL_CAPSULE | Freq: Every day | RESPIRATORY_TRACT | Status: DC
  Administered 2015-11-10 – 2015-11-13 (×4): 18 ug via RESPIRATORY_TRACT
  Filled 2015-11-10: qty 5

## 2015-11-10 MED ORDER — PREGABALIN 75 MG PO CAPS
150.0000 mg | ORAL_CAPSULE | Freq: Two times a day (BID) | ORAL | Status: DC
  Administered 2015-11-10 – 2015-11-13 (×7): 150 mg via ORAL
  Filled 2015-11-10 (×7): qty 2

## 2015-11-10 MED ORDER — SIMVASTATIN 20 MG PO TABS
10.0000 mg | ORAL_TABLET | Freq: Every day | ORAL | Status: DC
  Administered 2015-11-10 – 2015-11-12 (×3): 10 mg via ORAL
  Filled 2015-11-10 (×3): qty 1

## 2015-11-10 MED ORDER — PIPERACILLIN-TAZOBACTAM 4.5 G IVPB
4.5000 g | Freq: Three times a day (TID) | INTRAVENOUS | Status: DC
  Administered 2015-11-10: 4.5 g via INTRAVENOUS
  Filled 2015-11-10 (×3): qty 100

## 2015-11-10 MED ORDER — FENTANYL 50 MCG/HR TD PT72
50.0000 ug | MEDICATED_PATCH | TRANSDERMAL | Status: DC
  Administered 2015-11-10 – 2015-11-13 (×2): 50 ug via TRANSDERMAL
  Filled 2015-11-10 (×2): qty 1

## 2015-11-10 MED ORDER — MUPIROCIN CALCIUM 2 % EX CREA
1.0000 "application " | TOPICAL_CREAM | Freq: Two times a day (BID) | CUTANEOUS | Status: DC
  Administered 2015-11-10 – 2015-11-13 (×4): 1 via TOPICAL
  Filled 2015-11-10: qty 15

## 2015-11-10 MED ORDER — BENZONATATE 100 MG PO CAPS: 200.0000 mg | ORAL_CAPSULE | Freq: Three times a day (TID) | ORAL | Status: DC | PRN

## 2015-11-10 MED ORDER — VANCOMYCIN HCL IN DEXTROSE 750-5 MG/150ML-% IV SOLN
750.0000 mg | INTRAVENOUS | Status: DC
  Filled 2015-11-10: qty 150

## 2015-11-10 MED ORDER — ALPRAZOLAM 0.5 MG PO TABS
0.5000 mg | ORAL_TABLET | ORAL | Status: DC | PRN
  Administered 2015-11-12: 20:00:00 0.5 mg via ORAL
  Filled 2015-11-10: qty 1

## 2015-11-10 NOTE — Evaluation (Signed)
Physical Therapy Evaluation Patient Details Name: Cheryl [name] Branca MRN: [id number] DOB: 1951/05/22 Today's Date: 11/10/2015   History of Present Illness  Pt recently left [facility] went home and had a fever and breathing difficulty, has generally been weak for the last few months.  Clinical Impression  Pt is very weak and though she shows good effort with PT exam she is very limited with what she can do and walking 10 ft was exhausting for her.  She is very weak and has only marginal AROM in b/l LEs.  Apparently pt has falling at least 4x in the last month and generally speaking she is too weak and unsafe to go home.     Follow Up Recommendations SNF    Equipment Recommendations  None recommended by PT    Recommendations for Other Services       Precautions / Restrictions Precautions Precautions: Fall Restrictions Weight Bearing Restrictions: No      Mobility  Bed Mobility Overal bed mobility: Needs Assistance Bed Mobility: Supine to Sit;Sit to Supine     Supine to sit: Mod assist Sit to supine: Mod assist   General bed mobility comments: Pt shows good effort, but needs assist in and out of bed  Transfers Overall transfer level: Needs assistance Equipment used: Rolling walker (2 wheeled) Transfers: Sit to/from Stand Sit to Stand: Min assist         General transfer comment: Pt shows good effort getting to standing, but does need assist to lift and maintain upright.   Ambulation/Gait Ambulation/Gait assistance: Min assist Ambulation Distance (Feet): 10 Feet Assistive device: Rolling walker (2 wheeled)       General Gait Details: Short shuffling gait pattern with kyphotic posture.  No LOBs, but slow and guarded cadence.  Pt fatigued quickly and asks to turn around and return to bed.  Pt's O2 remains in the 90s (on 2 liters) with the effort but she was clearly very tired post ambulation.  Stairs            Wheelchair Mobility    Modified Rankin (Stroke  Patients Only)       Balance                                             Pertinent Vitals/Pain Pain Assessment: No/denies pain    Home Living Family/patient expects to be discharged to:: Skilled nursing facility Living Arrangements: Spouse/significant other Available Help at Discharge: Family;Available PRN/intermittently Type of Home: House Home Access: Ramped entrance     Home Layout: One level Home Equipment: Walker - 2 wheels;Walker - 4 wheels;Cane - single point;Shower seat;Wheelchair - manual      Prior Function Level of Independence: Needs assistance         Comments: apparently she has been very weak the last few months and has only minimally been out of the house.  Pt has had 4 falls in the last month.     Hand Dominance        Extremity/Trunk Assessment   Upper Extremity Assessment: Generalized weakness (pt with grossly 3+/5 strength in UEs)           Lower Extremity Assessment:  (3 to 3+/5 LE strength b/l )         Communication   Communication: No difficulties  Cognition Arousal/Alertness: Lethargic Behavior During Therapy: WFL for tasks assessed/performed Overall Cognitive Status:  Within Functional Limits for tasks assessed                      General Comments      Exercises        Assessment/Plan    PT Assessment Patient needs continued PT services  PT Diagnosis Difficulty walking;Generalized weakness   PT Problem List Decreased strength;Decreased range of motion;Decreased activity tolerance;Decreased balance;Decreased mobility;Decreased coordination;Decreased knowledge of use of DME;Decreased safety awareness;Decreased knowledge of precautions;Cardiopulmonary status limiting activity;Pain  PT Treatment Interventions DME instruction;Gait training;Stair training;Functional mobility training;Therapeutic activities;Therapeutic exercise;Balance training;Neuromuscular re-education;Patient/family education   PT  Goals (Current goals can be found in the Care Plan section) Acute Rehab PT Goals Patient Stated Goal: "I need to get stronger" PT Goal Formulation: With patient Time For Goal Achievement: 11/24/15    Frequency Min 2X/week   Barriers to discharge        Co-evaluation               End of Session Equipment Utilized During Treatment: Gait belt;Oxygen Activity Tolerance: Patient limited by fatigue;Patient limited by lethargy Patient left: in bed;with bed alarm set           Time: 4098-1191 PT Time Calculation (min) (ACUTE ONLY): 19 min   Charges:   PT Evaluation $Initial PT Evaluation Tier I: 1 Procedure     PT G Codes:       Loran Senters, PT, DPT [phone]  Malachi Pro 11/10/2015, 6:09 PM

## 2015-11-10 NOTE — Progress Notes (Signed)
Pharmacy Antibiotic Follow-up Note  Cheryl [name] is a 64 y.o. year-old female admitted on 11/09/2015.  The patient is currently on day 2 of Vancomycin, Levofloxacin and Zosyn  for PNA/Sepsis.  Assessment/Plan: After discussion with Dr. Eliane DecreeS.Patel, the current antibiotic(s) Vancomycin, Zosyn, and levofloxacin will be narrowed to Levofloxacin and continued for X days.  The stop date is entered and will be X.  Temp (24hrs), Avg:101.9 F (38.8 C), Min:99.8 F (37.7 C), Max:104.9 F (40.5 C)   Recent Labs Lab 11/09/15 2044 11/10/15 0200  WBC 8.3 10.1    Recent Labs Lab 11/09/15 2044 11/10/15 0200  CREATININE 0.97 0.85   Estimated Creatinine Clearance: 48.7 mL/min (by C-G formula based on Cr of 0.85).    Allergies  Allergen Reactions  . Percocet [Oxycodone-Acetaminophen] Hives  . Aspirin Nausea And Vomiting and Other (See Comments)    Reaction:  GI upset   . Codeine Nausea And Vomiting and Other (See Comments)    Reaction:  GI upset   . Morphine Other (See Comments)    Reaction:  Unknown   . Pregabalin Other (See Comments)    Reaction:  Makes pt sleep excessively.    . Propoxyphene Other (See Comments)    Reaction:  GI upset   . Sulfa Antibiotics Rash and Other (See Comments)    Reaction:  GI upset     Antimicrobials this admission: 12/15>> Levofloxacin  12/15 >> Vancomycin  12/15 >> Zosyn  Microbiology results: 12/15 BCx: pending 12/15 UCx: neg  12/15 MRSA PCR: neg  Thank you for allowing pharmacy to be a part of this patient's care.  Gardner CandleSheema M Nela Bascom PharmD 11/10/2015 12:24 PM

## 2015-11-10 NOTE — Progress Notes (Signed)
Initial Nutrition Assessment  DOCUMENTATION CODES:   Non-severe (moderate) malnutrition in context of chronic illness  INTERVENTION:  Meals and snacks: Cater to pt preferences, recommend regular diet and MD agreed Medical Nutrition Supplement Therapy: Will add magic cup BID for pt. Does not like ensure or those type products   NUTRITION DIAGNOSIS:   Inadequate oral intake related to acute illness as evidenced by per patient/family report.    GOAL:   Patient will meet greater than or equal to 90% of their needs    MONITOR:    (Energy intake, Electrolyte and renal profile, Glucose profile)  REASON FOR ASSESSMENT:   Malnutrition Screening Tool    ASSESSMENT:      Pt admitted with pneumonia, sepsis  Past Medical History  Diagnosis Date  . COPD (chronic obstructive pulmonary disease) (HCC)   . Hypertension   . Arthritis   . Hyperlipidemia   . GERD (gastroesophageal reflux disease)   . Neuropathy (HCC) 2010  . Oxygen deficiency   . Coronary artery disease   . Headache   . Anxiety   . Peripheral vascular disease (HCC)   . Vitamin D deficiency   . Migraines   . Chronic pain   . DVT (deep venous thrombosis) (HCC)   . Pneumonia   . Osteoporosis   . Allergy   . Asthma     Current Nutrition: eating few bites of orange sherbet at this time  Food/Nutrition-Related History: poor po intake for 3 days prior to admission,   Scheduled Medications:  . clopidogrel  75 mg Oral Daily  . donepezil  10 mg Oral QHS  . estropipate  0.75 mg Oral Daily  . fentaNYL  50 mcg Transdermal Q72H  . heparin  5,000 Units Subcutaneous 3 times per day  . [START ON 11/11/2015] levofloxacin (LEVAQUIN) IV  750 mg Intravenous Q48H  . mometasone-formoterol  2 puff Inhalation BID  . mupirocin cream  1 application Topical BID  . pantoprazole  40 mg Oral Daily  . pregabalin  150 mg Oral BID  . simvastatin  10 mg Oral QHS  . sodium chloride  3 mL Intravenous Q12H  . tiotropium  18 mcg  Inhalation Daily    Continuous Medications:  . sodium chloride 125 mL/hr at 11/10/15 1110     Electrolyte/Renal Profile and Glucose Profile:   Recent Labs Lab 11/09/15 2044 11/10/15 0200  NA 136 139  K 3.3* 3.6  CL 98* 104  CO2 29 27  BUN 16 14  CREATININE 0.97 0.85  CALCIUM 8.3* 7.4*  GLUCOSE 118* 109*   Protein Profile:   Recent Labs Lab 11/09/15 2044  ALBUMIN 3.3*    Gastrointestinal Profile: Last BM:12/15   Nutrition-Focused Physical Exam Findings: Nutrition-Focused physical exam completed. Findings are mild fat depletion, mild/moderate muscle depletion, and no edema.      Weight Change: note wt gain prior to admission but husband seems to think 7-8 pound weight loss in the last 4 weeks.    Diet Order:  Diet Heart Room service appropriate?: Yes; Fluid consistency:: Thin  Skin:   reviewed  Height:   Ht Readings from Last 1 Encounters:  11/09/15  (1.575 m)    Weight:   Wt Readings from Last 1 Encounters:  11/10/15 104 lb 9.6 oz (47.446 kg)    Ideal Body Weight:     BMI:  Body mass index is 19.13 kg/(m^2).  Estimated Nutritional Needs:   Kcal:  BEE 963 kcals (IF 1.0-1.2, AF 1.3) 4098-1191 kcals/d  Protein:  (1.0-1.2 g/kg) 47-56 g/kg  Fluid:  (25-3330ml/kg) 1175-143610ml/d  EDUCATION NEEDS:   No education needs identified at this time  MODERATE Care Level  Kayhan Boardley B. Freida BusmanAllen, RD, LDN [phone] (pager)

## 2015-11-10 NOTE — Clinical Social Work Note (Signed)
Clinical Social Work Assessment  Patient Details  Name: Cheryl [name] MRN: [id number] Date of Birth: 20-Jun-1951  Date of referral:  11/10/15               Reason for consult:  Facility Placement                Permission sought to share information with:  Family Supports Permission granted to share information::  No   Housing/Transportation Living arrangements for the past 2 months:  [place], [place] of Information:  Patient Patient Interpreter Needed:  None Criminal Activity/Legal Involvement Pertinent to Current Situation/Hospitalization:  No - Comment as needed Significant Relationships:  Other(Comment) Lives with:  Self Do you feel safe going back to the place where you live?  No Need for family participation in patient care:   [place] giving concerns:  No concerns identified.    Social Worker assessment / plan:  CSW met with pt to address consult for New SNF. PT eval is pending. Pt was discharged from [facility] a few days ago. CSW introduced herself and explained role of social work. Pt would like to go back to [facility] if recommended. CSW updated facility. Pt declined to have CSW call anyone. CSW will initiate SNF search. CSW will continue to follow.   Employment status:  Retired Nurse, adult PT Recommendations:  [place] / Referral to community resources:  [place]  Patient/Family's Response to care:  Pt was appreciative of CSW support.   Patient/Family's Understanding of and Emotional Response to Diagnosis, Current Treatment, and Prognosis:  Pt would like SNF.   Emotional Assessment Appearance:  Appears older than stated age Attitude/Demeanor/Rapport:   (Appropriate) Affect (typically observed):  Accepting, Pleasant Orientation:  Oriented to Self, Oriented to Place, Oriented to  Time, Oriented to Situation Alcohol / Substance use:  Never Used Psych  involvement (Current and /or in the community):  No (Comment)  Discharge Needs  Concerns to be addressed:    Readmission within the last 30 days:  Yes Current discharge risk:  None Barriers to Discharge:  No Barriers Identified   Darden Dates, LCSW 11/10/2015, 4:42 PM

## 2015-11-10 NOTE — Progress Notes (Addendum)
ANTIBIOTIC CONSULT NOTE - INITIAL  Pharmacy Consult for vancomycin, Zosyn, and Levaquin dosing Indication: HCAP  Allergies  Allergen Reactions  . Percocet [Oxycodone-Acetaminophen] Hives  . Aspirin Nausea And Vomiting and Other (See Comments)    Reaction:  GI upset   . Codeine Nausea And Vomiting and Other (See Comments)    Reaction:  GI upset   . Morphine Other (See Comments)    Reaction:  Unknown   . Pregabalin Other (See Comments)    Reaction:  Makes pt sleep excessively.    . Propoxyphene Other (See Comments)    Reaction:  GI upset   . Sulfa Antibiotics Rash and Other (See Comments)    Reaction:  GI upset     Patient Measurements: Height:  (157.5 cm) Weight: 104 lb 9.6 oz (47.446 kg) IBW/kg (Calculated) : 50.1 Adjusted Body Weight: 45.4kg  Vital Signs: Temp: 99.8 F (37.7 C) (12/16 0459) Temp Source: Oral (12/16 0459) BP: 106/71 mmHg (12/16 0459) Pulse Rate: 92 (12/16 0459) Intake/Output from previous day: 12/15 0701 - 12/16 0700 In: 1970 [P.O.:120; I.V.:1850] Out: 1050 [Urine:1050]  Recent Labs  11/09/15 2044 11/10/15 0200  WBC 8.3 10.1  HGB 9.9* 8.9*  PLT 294 252  CREATININE 0.97 0.85   Estimated Creatinine Clearance: 48.7 mL/min (by C-G formula based on Cr of 0.85). No results for input(s): VANCOTROUGH, VANCOPEAK, VANCORANDOM, GENTTROUGH, GENTPEAK, GENTRANDOM, TOBRATROUGH, TOBRAPEAK, TOBRARND, AMIKACINPEAK, AMIKACINTROU, AMIKACIN in the last 72 hours.   Microbiology: Recent Results (from the past 720 hour(s))  Blood Culture (routine x 2)     Status: None   Collection Time: 10/17/15  6:13 PM  Result Value Ref Range Status   Specimen Description BLOOD  RIGHT, ARM  Final   Special Requests   Final    BOTTLES DRAWN AEROBIC AND ANAEROBIC  7CC AERO, 6CC ANAERO   Culture NO GROWTH 5 DAYS  Final   Report Status 10/22/2015 FINAL  Final  Blood Culture (routine x 2)     Status: None   Collection Time: 10/17/15  6:15 PM  Result Value Ref Range Status   Specimen Description BLOOD LEFT ARM  Final   Special Requests   Final    BOTTLES DRAWN AEROBIC AND ANAEROBIC  3CC AERO, 4CC ANAERO   Culture NO GROWTH 5 DAYS  Final   Report Status 10/22/2015 FINAL  Final  Urine culture     Status: None   Collection Time: 10/17/15  6:55 PM  Result Value Ref Range Status   Specimen Description URINE, RANDOM  Final   Special Requests NONE  Final   Culture INSIGNIFICANT GROWTH  Final   Report Status 10/19/2015 FINAL  Final  Culture, expectorated sputum-assessment     Status: None   Collection Time: 10/17/15  7:42 PM  Result Value Ref Range Status   Specimen Description SPU  Final   Special Requests Normal  Final   Sputum evaluation THIS SPECIMEN IS ACCEPTABLE FOR SPUTUM CULTURE  Final   Report Status 10/17/2015 FINAL  Final  Culture, respiratory (NON-Expectorated)     Status: None   Collection Time: 10/17/15  7:42 PM  Result Value Ref Range Status   Specimen Description SPU  Final   Special Requests Normal Reflexed from [id number]  Final   Gram Stain   Final    MANY WBC SEEN FEW GRAM NEGATIVE RODS RARE GRAM POSITIVE COCCI GOOD SPECIMEN - 80-90% WBCS    Culture LIGHT GROWTH KLEBSIELLA PNEUMONIAE  Final   Report Status 10/21/2015 FINAL  Final   Organism ID, Bacteria KLEBSIELLA PNEUMONIAE  Final      Susceptibility   Klebsiella pneumoniae - MIC*    AMPICILLIN Value in next row Resistant      RESISTANT16    CEFTAZIDIME Value in next row Sensitive      SENSITIVE<=1    CEFEPIME Value in next row Sensitive      SENSITIVE<=1    CEFAZOLIN Value in next row Sensitive      SENSITIVE<=4    CEFTRIAXONE Value in next row Sensitive      SENSITIVE<=1    CIPROFLOXACIN Value in next row Sensitive      SENSITIVE<=0.25    GENTAMICIN Value in next row Sensitive      SENSITIVE<=1    IMIPENEM Value in next row Sensitive      SENSITIVE<=0.25    TRIMETH/SULFA Value in next row Sensitive      SENSITIVE<=20    PIP/TAZO Value in next row Sensitive       SENSITIVE<=4    * LIGHT GROWTH KLEBSIELLA PNEUMONIAE  MRSA PCR Screening     Status: None   Collection Time: 11/10/15  2:20 AM  Result Value Ref Range Status   MRSA by PCR NEGATIVE NEGATIVE Final    Comment:        The GeneXpert MRSA Assay (FDA approved for NASAL specimens only), is one component of a comprehensive MRSA colonization surveillance program. It is not intended to diagnose MRSA infection nor to guide or monitor treatment for MRSA infections.     Medical History: Past Medical History  Diagnosis Date  . COPD (chronic obstructive pulmonary disease) (HCC)   . Hypertension   . Arthritis   . Hyperlipidemia   . GERD (gastroesophageal reflux disease)   . Neuropathy (HCC) 2010  . Oxygen deficiency   . Coronary artery disease   . Headache   . Anxiety   . Peripheral vascular disease (HCC)   . Vitamin D deficiency   . Migraines   . Chronic pain   . DVT (deep venous thrombosis) (HCC)   . Pneumonia   . Osteoporosis   . Allergy   . Asthma     Assessment: 64 yo female presenting with SOB and fever of 105 on 12/15. Pharmacy consulted for Vancomycin, Levoquin, and Zosyn dosing for Sepsis from RUL PNA. Patient was hospitalized in late November (discharged: 11/27) for HCAP, her sputum cultures grew Klebsiella pneumoniae during that visit. She was discharged at that time on azithromycin.   TBW 45.4kg  IBW 50.1kg  DW 45.4kg  Vd 32L    ke: 0.038 hr-1  T1/2 18 hours  12/15: Vancomycin 750 mg q 24 hours ordered with stacked dosing, Zosyn 4.5h q8h, and Levaquin 750mg  q48 hours.   12/15 CXR: RLL PNA  12/16: MRSA PCR negative, Tmax 104.9,  WBC:  10.1       Goal of Therapy:  Vancomycin trough level 15-20 mcg/ml  Plan:  Will continue vancomycin 750mg  q24h. Will change Zosyn to 3.375g IV EI q8h.   Will Continue Levaquin q48h based on CrCl <4050ml/min.   Previous sputum cultures showed Klebsiella pneumoniae only resistant to ampicillin. MRSA PCR is negative. Will recommend  narrowing of therapy during rounds.   Cheryl NakaiSheema Royden [name], PharmD Pharmacy Resident 11/10/2015,8:44 AM

## 2015-11-10 NOTE — Progress Notes (Signed)
[facility]  Patient Status: active  [facility] is providing the following services: SN/PT/OT If patient discharges after hours, please call [phone].   Scherrie GerlachJason E Hinton 11/10/2015, 10:05 AM

## 2015-11-10 NOTE — Progress Notes (Signed)
ANTIBIOTIC CONSULT NOTE - INITIAL  Pharmacy Consult for vancomycin, Zosyn, and Levaquin dosing Indication: HCAP  Allergies  Allergen Reactions  . Percocet [Oxycodone-Acetaminophen] Hives  . Aspirin Nausea And Vomiting and Other (See Comments)    Reaction:  GI upset   . Codeine Nausea And Vomiting and Other (See Comments)    Reaction:  GI upset   . Morphine Other (See Comments)    Reaction:  Unknown   . Pregabalin Other (See Comments)    Reaction:  Makes pt sleep excessively.    . Propoxyphene Other (See Comments)    Reaction:  GI upset   . Sulfa Antibiotics Rash and Other (See Comments)    Reaction:  GI upset     Patient Measurements: Height: 5\' 2"  (157.5 cm) Weight: 100 lb (45.36 kg) IBW/kg (Calculated) : 50.1 Adjusted Body Weight: 45.4kg  Vital Signs: Temp: 99.8 F (37.7 C) (12/15 2345) Temp Source: Rectal (12/15 2038) BP: 103/49 mmHg (12/15 2345) Pulse Rate: 85 (12/15 2345) Intake/Output from previous day: 12/15 0701 - 12/16 0700 In: 1970 [P.O.:120; I.V.:1850] Out: 350 [Urine:350] Intake/Output from this shift: Total I/O In: 1970 [P.O.:120; I.V.:1850] Out: 350 [Urine:350]  Labs:  Recent Labs  11/09/15 2044  WBC 8.3  HGB 9.9*  PLT 294  CREATININE 0.97   Estimated Creatinine Clearance: 40.9 mL/min (by C-G formula based on Cr of 0.97). No results for input(s): VANCOTROUGH, VANCOPEAK, VANCORANDOM, GENTTROUGH, GENTPEAK, GENTRANDOM, TOBRATROUGH, TOBRAPEAK, TOBRARND, AMIKACINPEAK, AMIKACINTROU, AMIKACIN in the last 72 hours.   Microbiology: Recent Results (from the past 720 hour(s))  Blood Culture (routine x 2)     Status: None   Collection Time: 10/17/15  6:13 PM  Result Value Ref Range Status   Specimen Description BLOOD  RIGHT, ARM  Final   Special Requests   Final    BOTTLES DRAWN AEROBIC AND ANAEROBIC  7CC AERO, 6CC ANAERO   Culture NO GROWTH 5 DAYS  Final   Report Status 10/22/2015 FINAL  Final  Blood Culture (routine x 2)     Status: None   Collection Time: 10/17/15  6:15 PM  Result Value Ref Range Status   Specimen Description BLOOD LEFT ARM  Final   Special Requests   Final    BOTTLES DRAWN AEROBIC AND ANAEROBIC  3CC AERO, 4CC ANAERO   Culture NO GROWTH 5 DAYS  Final   Report Status 10/22/2015 FINAL  Final  Urine culture     Status: None   Collection Time: 10/17/15  6:55 PM  Result Value Ref Range Status   Specimen Description URINE, RANDOM  Final   Special Requests NONE  Final   Culture INSIGNIFICANT GROWTH  Final   Report Status 10/19/2015 FINAL  Final  Culture, expectorated sputum-assessment     Status: None   Collection Time: 10/17/15  7:42 PM  Result Value Ref Range Status   Specimen Description SPU  Final   Special Requests Normal  Final   Sputum evaluation THIS SPECIMEN IS ACCEPTABLE FOR SPUTUM CULTURE  Final   Report Status 10/17/2015 FINAL  Final  Culture, respiratory (NON-Expectorated)     Status: None   Collection Time: 10/17/15  7:42 PM  Result Value Ref Range Status   Specimen Description SPU  Final   Special Requests Normal Reflexed from [id number]  Final   Gram Stain   Final    MANY WBC SEEN FEW GRAM NEGATIVE RODS RARE GRAM POSITIVE COCCI GOOD SPECIMEN - 80-90% WBCS    Culture LIGHT GROWTH KLEBSIELLA PNEUMONIAE  Final  Report Status 10/21/2015 FINAL  Final   Organism ID, Bacteria KLEBSIELLA PNEUMONIAE  Final      Susceptibility   Klebsiella pneumoniae - MIC*    AMPICILLIN Value in next row Resistant      RESISTANT16    CEFTAZIDIME Value in next row Sensitive      SENSITIVE<=1    CEFEPIME Value in next row Sensitive      SENSITIVE<=1    CEFAZOLIN Value in next row Sensitive      SENSITIVE<=4    CEFTRIAXONE Value in next row Sensitive      SENSITIVE<=1    CIPROFLOXACIN Value in next row Sensitive      SENSITIVE<=0.25    GENTAMICIN Value in next row Sensitive      SENSITIVE<=1    IMIPENEM Value in next row Sensitive      SENSITIVE<=0.25    TRIMETH/SULFA Value in next row Sensitive       SENSITIVE<=20    PIP/TAZO Value in next row Sensitive      SENSITIVE<=4    * LIGHT GROWTH KLEBSIELLA PNEUMONIAE    Medical History: Past Medical History  Diagnosis Date  . COPD (chronic obstructive pulmonary disease) (HCC)   . Hypertension   . Arthritis   . Hyperlipidemia   . GERD (gastroesophageal reflux disease)   . Neuropathy (HCC) 2010  . Oxygen deficiency   . Coronary artery disease   . Headache   . Anxiety   . Peripheral vascular disease (HCC)   . Vitamin D deficiency   . Migraines   . Chronic pain   . DVT (deep venous thrombosis) (HCC)   . Pneumonia   . Osteoporosis   . Allergy   . Asthma     Medications:   Assessment: Blood and urine cx pending CXR: RLL pneumonia UA: (-)   Goal of Therapy:  Vancomycin trough level 15-20 mcg/ml  Plan:  TBW 45.4kg  IBW 50.1kg  DW 45.4kg  Vd 32L kei 0.038 hr-1  T1/2 18 hours Vancomycin 750 mg q 24 hours ordered with stacked dosing. Level before 5th dose.  Zosyn 4.5 grams q 8 hours ordered for Pseudomonas risk of recent abx exposure.  Levaquin 750 mg IV q 48 hours ordered.  Weston Kallman S 11/10/2015,12:19 AM

## 2015-11-10 NOTE — Progress Notes (Signed)
Pt alert and oriented, sleepy but arousable. She does not complain of pain or discomfort. O2 @2L  per [place] 99%. IV abx continues.

## 2015-11-10 NOTE — Progress Notes (Signed)
[facility] - [place] at [facility]   PATIENT NAME: Cheryl [name]    MR#:  [id number]  DATE OF BIRTH:  Aug 21, 1951  SUBJECTIVE:  Came in with increasing shortness of breath and cough found to have a right lower lobe pneumonia. Uses oxygen as needed at home.  REVIEW OF SYSTEMS:   Review of Systems  Constitutional: Negative for fever, chills and weight loss.  HENT: Negative for ear discharge, ear pain and nosebleeds.   Eyes: Negative for blurred vision, pain and discharge.  Respiratory: Positive for cough and sputum production. Negative for shortness of breath, wheezing and stridor.   Cardiovascular: Negative for chest pain, palpitations, orthopnea and PND.  Gastrointestinal: Negative for nausea, vomiting, abdominal pain and diarrhea.  Genitourinary: Negative for urgency and frequency.  Musculoskeletal: Negative for back pain and joint pain.  Neurological: Positive for weakness. Negative for sensory change, speech change and focal weakness.  Psychiatric/Behavioral: Negative for depression and hallucinations. The patient is not nervous/anxious.   All other systems reviewed and are negative.  Tolerating Diet:yes Tolerating PT: pending  DRUG ALLERGIES:   Allergies  Allergen Reactions  . Percocet [Oxycodone-Acetaminophen] Hives  . Aspirin Nausea And Vomiting and Other (See Comments)    Reaction:  GI upset   . Codeine Nausea And Vomiting and Other (See Comments)    Reaction:  GI upset   . Morphine Other (See Comments)    Reaction:  Unknown   . Pregabalin Other (See Comments)    Reaction:  Makes pt sleep excessively.    . Propoxyphene Other (See Comments)    Reaction:  GI upset   . Sulfa Antibiotics Rash and Other (See Comments)    Reaction:  GI upset     VITALS:  Blood pressure 100/43, pulse 80, temperature 98.7 F (37.1 C), temperature source Oral, resp. rate 18, height  (1.575 m), weight 104 lb 9.6 oz (47.446 kg), SpO2 98 %.  PHYSICAL  EXAMINATION:   Physical Exam  GENERAL:  64 y.o.-year-old patient lying in the bed with no acute distress. Thin cachectic EYES: Pupils equal, round, reactive to light and accommodation. No scleral icterus. Extraocular muscles intact.  HEENT: Head atraumatic, normocephalic. Oropharynx and nasopharynx clear.  NECK:  Supple, no jugular venous distention. No thyroid enlargement, no tenderness.  LUNGS: Decreased breath sounds bilaterally, no wheezing, no rales, rhonchi. No use of accessory muscles of respiration.  CARDIOVASCULAR: S1, S2 normal. No murmurs, rubs, or gallops.  ABDOMEN: Soft, nontender, nondistended. Bowel sounds present. No organomegaly or mass.  EXTREMITIES: No cyanosis, clubbing or edema b/l.    NEUROLOGIC: Cranial nerves II through XII are intact. No focal Motor or sensory deficits b/l.   PSYCHIATRIC:  patient is alert and oriented x 3.  SKIN: No obvious rash, lesion, or ulcer.   LABORATORY PANEL:   CBC  Recent Labs Lab 11/10/15 0200  WBC 10.1  HGB 8.9*  HCT 27.9*  PLT 252    Chemistries   Recent Labs Lab 11/09/15 2044 11/10/15 0200  NA 136 139  K 3.3* 3.6  CL 98* 104  CO2 29 27  GLUCOSE 118* 109*  BUN 16 14  CREATININE 0.97 0.85  CALCIUM 8.3* 7.4*  AST 17  --   ALT 10*  --   ALKPHOS 104  --   BILITOT 0.5  --     Cardiac Enzymes  Recent Labs Lab 11/09/15 2044  TROPONINI 0.04*    RADIOLOGY:  Dg Chest Port 1 View  11/09/2015  CLINICAL  DATA:  Recently discharged from hospital for pneumonia. Vomiting at home. Patient is been lethargic and a time date. Responsive to pain but nonverbal. Fever. EXAM: PORTABLE CHEST 1 VIEW COMPARISON:  10/20/2015 FINDINGS: Cardiac enlargement without significant vascular congestion. Airspace disease in the right lower lung consistent with pneumonia. Linear atelectasis in the left mid lung. No blunting of costophrenic angles. No pneumothorax. Calcified and tortuous aorta. Mediastinal contours appear intact. IMPRESSION:  Cardiac enlargement with mild pulmonary vascular congestion. Airspace disease in the right lower lung consistent with pneumonia. Electronically Signed   By: Burman NievesWilliam  Stevens M.D.   On: 11/09/2015 20:58   ASSESSMENT AND PLAN:   64 year old Caucasian female history of COPD oxygen requiring presenting with fever  1.Sepsis, meeting septic criteria by heart rate, respiratory rate, temperature present on arrival. Source healthcare associated pneumonia  -was on  vancomycin/Zosyn/Levaquin and taper antibiotics to levaquin -BC neg so far  2. COPD not in acute exacerbation, no wheezing:  -Continue with oxygen as above as well as home medications  3. GERD without esophagitis PPI therapy  4. Hyperlipidemia unspecified statin therapy  5. Venous thromboembolism prophylactic: Heparin subcutaneous  6.PT to see  Case discussed with Care Management/Social Worker. Management plans discussed with the patient, family and they are in agreement.  CODE STATUS: full  DVT Prophylaxis: lovenox  TOTAL TIME TAKING CARE OF THIS PATIENT: 30 minutes.  >50% time spent on counselling and coordination of care  POSSIBLE D/C IN 1-2 DAYS, DEPENDING ON CLINICAL CONDITION.   Kura Bethards M.D on 11/10/2015 at 2:25 PM  Between 7am to 6pm - Pager - [phone]  After 6pm go to [email or web address] - password [email or web address] [facility]  [place] [place] [facility]  Office  [phone]  CC: Primary care physician; Mila Merryonald Fisher, MD

## 2015-11-10 NOTE — Progress Notes (Signed)
   11/10/15 1500  Clinical Encounter Type  Visited With Patient  Visit Type Initial  Referral From Nurse  Consult/Referral To Chaplain  Spiritual Encounters  Spiritual Needs Emotional  Completing an order for prayer with patient. Good conversation, seems good emotionally too. Chaplain Cordelia PocheEmily Naveah Brave

## 2015-11-10 NOTE — Plan of Care (Signed)
Problem: Nutrition: Goal: Adequate nutrition will be maintained Outcome: Progressing IVF infusing. IV ABX as well. PT consult pending. Pt 2 assist. No c/o pain. Husband at the bedside.

## 2015-11-10 NOTE — Care Management (Signed)
Admitted to [facility] with the diagnosis of sepsis. Discharged from this facility 10/22/15.  Lives with husband, [name], [phone] or [phone] ). Seen Dr. Sherrie MustacheFisher in the last year,  Home oxygen thru [employer] x 1 year. States she uses 2 liters at night. Followed by [facility] for Home health and physical therapy. Uses a rolling walker to aid in ambulation. [facility] past. [facility] [employer] in the home. Takes care of all basic activities of daily living herself. States she has to get her husband to help her get in and out of the shower. States she drives on Wednesdays to have lunch with her brothers and sisters. Last fall was 4 weeks ago. "I eat what I want during the day, but big meal in the afternoon."  Gwenette GreetBrenda S Gwenyth Dingee RN MSN CCM Care Management [phone]

## 2015-11-10 NOTE — [place] FL2 (Addendum)
[place] MEDICAID FL2 LEVEL OF CARE SCREENING TOOL     IDENTIFICATION  Patient Name: Cheryl [name] Birthdate: 01/15/1951 Sex: female Admission Date (Current Location): 11/09/2015  [place] and [place] Number: [facility] and Address:  [facility], [address], [place], [place] [address]      Provider Number: [id number]  Attending Physician Name and Address:  Enedina Finner, MD  Relative Name and Phone Number:       Current Level of Care: Hospital Recommended Level of Care: Skilled Nursing Facility Prior Approval Number:    Date Approved/Denied:   PASRR Number: [id number]  Discharge Plan: Home    Current Diagnoses: Patient Active Problem List   Diagnosis Date Noted  . Sepsis (HCC) 10/17/2015  . Abnormal mammogram of right breast 10/11/2015  . Dilated intrahepatic bile duct 10/11/2015  . Anemia 10/11/2015  . Lumbar radicular pain 09/04/2015  . Chronic low back pain 09/04/2015  . Other long term (current) drug therapy 09/04/2015  . Encounter for long-term opiate analgesic use 09/04/2015  . Encounter for therapeutic drug level monitoring 09/04/2015  . Opiate use 09/04/2015  . Uncomplicated opioid dependence (HCC) 09/04/2015  . Chronic pain syndrome 09/04/2015  . Platelet inhibition due to Plavix 09/04/2015  . COPD (chronic obstructive pulmonary disease) (HCC) 07/31/2015  . Dementia 07/31/2015  . Gastrointestinal complaints, nonspecific 07/31/2015  . Absolute anemia 07/03/2015  . Airway hyperreactivity 07/03/2015  . Back pain, thoracic 07/03/2015  . Diaphoresis 07/03/2015  . Excessive falling 07/03/2015  . Alteration in bowel elimination: incontinence 07/03/2015  . Insomnia 07/03/2015  . Decreased testosterone level 07/03/2015  . Leg weakness 07/03/2015  . Menopausal symptom 07/03/2015  . Migraine 07/03/2015  . Neuropathy (HCC) 07/03/2015  . Fecal occult blood test positive 07/03/2015  . OP (osteoporosis)  07/03/2015  . Episodic paroxysmal anxiety disorder 07/03/2015  . Compulsive tobacco user syndrome 07/03/2015  . Urinary incontinence 07/03/2015  . Weight loss 07/03/2015  . Essential hypertension 06/06/2015  . GERD (gastroesophageal reflux disease) 06/06/2015  . Hyperlipemia 06/06/2015  . Disorder of peripheral nervous system (HCC) 04/13/2014  . 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 Anxiety 02/10/2014  . Coronary artery disease 02/10/2014  . Peripheral blood vessel disorder (HCC) 02/10/2014  . Hypercholesteremia 02/10/2014  . Peripheral vascular disease (HCC) 02/10/2014  . Vitamin D deficiency 10/16/2009    Orientation RESPIRATION BLADDER Height & Weight    Self, Time, Situation  Normal Continent  (157.5 cm) 104 lbs.  BEHAVIORAL SYMPTOMS/MOOD NEUROLOGICAL BOWEL NUTRITION STATUS      Continent Diet (Heart Healthy/Thin Liquids)  AMBULATORY STATUS COMMUNICATION OF NEEDS Skin   Limited Assist Verbally Normal                       Personal Care Assistance Level of Assistance  Bathing, Feeding, Dressing Bathing Assistance: Limited assistance Feeding assistance: Limited assistance Dressing Assistance: Limited assistance     Functional Limitations Info             SPECIAL CARE FACTORS FREQUENCY  PT (By licensed PT)                    Contractures      Additional Factors Info  Code Status Code Status Info: Full Code Allergies Info: PERCOCET, ASPIRIN, CODEINE, MORPHINE, PREGABALIN, PROPOXYPHENE, SULFA ANTIBIOTICS           Current Medications (11/10/2015):  This is the current hospital active medication list Current Facility-Administered Medications  Medication Dose Route Frequency Provider Last Rate Last Dose  . 0.9 %  sodium chloride infusion   Intravenous Continuous Wyatt Hasteavid K Hower, MD 125 mL/hr at 11/10/15 1110    . acetaminophen (TYLENOL) tablet 650 mg  650 mg Oral Q6H PRN Wyatt Hasteavid K Hower, MD       Or  . acetaminophen (TYLENOL) suppository  650 mg  650 mg Rectal Q6H PRN Wyatt Hasteavid K Hower, MD      . ALPRAZolam Prudy Feeler(XANAX) tablet 0.5 mg  0.5 mg Oral Q4H PRN Wyatt Hasteavid K Hower, MD      . benzonatate (TESSALON) capsule 200 mg  200 mg Oral TID PRN Wyatt Hasteavid K Hower, MD      . clopidogrel (PLAVIX) tablet 75 mg  75 mg Oral Daily Wyatt Hasteavid K Hower, MD   75 mg at 11/10/15 [id number]  . donepezil (ARICEPT) tablet 10 mg  10 mg Oral QHS Wyatt Hasteavid K Hower, MD   10 mg at 11/10/15 0229  . estropipate (OGEN) tablet 0.75 mg  0.75 mg Oral Daily Wyatt Hasteavid K Hower, MD      . fentaNYL (DURAGESIC - dosed mcg/hr) 50 mcg  50 mcg Transdermal Q72H Wyatt Hasteavid K Hower, MD   50 mcg at 11/10/15 0200  . heparin injection 5,000 Units  5,000 Units Subcutaneous 3 times per day Wyatt Hasteavid K Hower, MD   5,000 Units at 11/10/15 [phone]  . [START ON 11/11/2015] levofloxacin (LEVAQUIN) IVPB 750 mg  750 mg Intravenous Q48H Wyatt Hasteavid K Hower, MD      . mometasone-formoterol [facility](DULERA) 100-5 MCG/ACT inhaler 2 puff  2 puff Inhalation BID Wyatt Hasteavid K Hower, MD   2 puff at 11/10/15 [phone]  . mupirocin cream (BACTROBAN) 2 % 1 application  1 application Topical BID Wyatt Hasteavid K Hower, MD   1 application at 11/10/15 0825  . ondansetron (ZOFRAN) tablet 4 mg  4 mg Oral Q6H PRN Wyatt Hasteavid K Hower, MD       Or  . ondansetron [facility](ZOFRAN) injection 4 mg  4 mg Intravenous Q6H PRN Wyatt Hasteavid K Hower, MD      . pantoprazole (PROTONIX) EC tablet 40 mg  40 mg Oral Daily Wyatt Hasteavid K Hower, MD   40 mg at 11/10/15 [id number]  . pregabalin (LYRICA) capsule 150 mg  150 mg Oral BID Wyatt Hasteavid K Hower, MD   150 mg at 11/10/15 [id number]  . simvastatin (ZOCOR) tablet 10 mg  10 mg Oral QHS Wyatt Hasteavid K Hower, MD   10 mg at 11/10/15 0230  . sodium chloride 0.9 % injection 3 mL  3 mL Intravenous Q12H Wyatt Hasteavid K Hower, MD   3 mL at 11/10/15 0826  . tiotropium (SPIRIVA) inhalation capsule 18 mcg  18 mcg Inhalation Daily Wyatt Hasteavid K Hower, MD   18 mcg at 11/10/15 0825     Discharge Medications: Please see discharge summary for a list of discharge medications.  Relevant Imaging Results:  Relevant Lab  Results:   Additional Information SSN:  [id number]  Dede QuerySarah McNulty, LCSW

## 2015-11-11 MED ORDER — LEVOFLOXACIN 750 MG PO TABS: 750.0000 mg | ORAL_TABLET | Freq: Every day | ORAL | Status: DC

## 2015-11-11 NOTE — Plan of Care (Signed)
Problem: Spiritual Needs Goal: Ability to function at adequate level Outcome: Completed/Met Date Met:  11/11/15 Denies spiritual needs; knows resources.  Problem: Education: Goal: Knowledge of [facility] Education information/materials will improve Outcome: Completed/Met Date Met:  11/11/15 Education completed with pt and husband.  Problem: Safety: Goal: Ability to remain free from injury will improve Outcome: Progressing No injuries or safety issues this shift.  Problem: Health Behavior/Discharge Planning: Goal: Ability to manage health-related needs will improve Outcome: Progressing Husband and pt education on compliance of meds/treatments/early interventions done. Husband concerned that pt has low grade fever. Will transition to po antibiotics. IVF's and cardiac monitor discontinued today. O2 discontinued with Pulse ox 94-97 % on RA.  Rare dry nonproductive cough  Problem: Pain Managment: Goal: General experience of comfort will improve Outcome: Progressing No pain reported this shift  Problem: Physical Regulation: Goal: Ability to maintain clinical measurements within normal limits will improve Outcome: Progressing Up to [facility] with SB+ and tolerated well.  Goal: Will remain free from infection Outcome: Progressing Low grade fever; respiratory status improved; currently on RA and tolerating well. Ambulated in hall with Pulse ox 94%  Problem: Skin Integrity: Goal: Risk for impaired skin integrity will decrease Outcome: Completed/Met Date Met:  11/11/15 No skin issues  Problem: Tissue Perfusion: Goal: Risk factors for ineffective tissue perfusion will decrease Outcome: Progressing Encouraged increased activity. Rested quietly in bed at intervals.   Problem: Activity: Goal: Risk for activity intolerance will decrease Outcome: Progressing Encouraged activity, exercise, moving in bed, DB's and effective coughing.  Problem: Fluid Volume: Goal: Ability to maintain  a balanced intake and output will improve Outcome: Progressing IVF's discontinued. Nutritional intake with variety of foods done. Husband providing some outside foods. Taking adequate po fluids at this time.  Problem: Nutrition: Goal: Adequate nutrition will be maintained Outcome: Progressing Eating better with increased intake this shift.

## 2015-11-11 NOTE — Discharge Summary (Signed)
[facility] - [place] at [facility]   PATIENT NAME: Cheryl [name]    MR#:  [id number]  DATE OF BIRTH:  1951-05-14  DATE OF ADMISSION:  11/09/2015 ADMITTING PHYSICIAN: Wyatt Haste, MD  DATE OF DISCHARGE: 11/11/15  PRIMARY CARE PHYSICIAN: Mila Merry, MD    ADMISSION DIAGNOSIS:  Healthcare-associated pneumonia [J18.9] Sepsis, due to unspecified organism (HCC) [A41.9]  DISCHARGE DIAGNOSIS:  Sepsis due to right LL Pneumonia COPD with tobacco abuse GERD  SECONDARY DIAGNOSIS:   Past Medical History  Diagnosis Date  . COPD (chronic obstructive pulmonary disease) (HCC)   . Hypertension   . Arthritis   . Hyperlipidemia   . GERD (gastroesophageal reflux disease)   . Neuropathy (HCC) 2010  . Oxygen deficiency   . Coronary artery disease   . Headache   . Anxiety   . Peripheral vascular disease (HCC)   . Vitamin D deficiency   . Migraines   . Chronic pain   . DVT (deep venous thrombosis) (HCC)   . Pneumonia   . Osteoporosis   . Allergy   . Asthma     HOSPITAL COURSE:   64 year old Caucasian female history of COPD oxygen requiring presenting with fever  1.Sepsis, meeting septic criteria by heart rate, respiratory rate, temperature present on arrival. Source healthcare associated pneumonia  -was on vancomycin/Zosyn/Levaquin and taper antibiotics to levaquin -BC neg so far -wean oxygen to RA -doing well,afebrile  2. COPD not in acute exacerbation, no wheezing:  -Continue with oxygen as above as well as home medications  3. GERD without esophagitis PPI therapy  4. Hyperlipidemia unspecified statin therapy  5. Venous thromboembolism prophylactic: Heparin subcutaneous  6.PT recommneds STR Will d/c to rehab when bed available D/w family  CONSULTS OBTAINED:  Treatment Team:  Wyatt Haste, MD  DRUG ALLERGIES:   Allergies  Allergen Reactions  . Percocet [Oxycodone-Acetaminophen] Hives  . Aspirin Nausea And Vomiting and Other (See  Comments)    Reaction:  GI upset   . Codeine Nausea And Vomiting and Other (See Comments)    Reaction:  GI upset   . Morphine Other (See Comments)    Reaction:  Unknown   . Pregabalin Other (See Comments)    Reaction:  Makes pt sleep excessively.    . Propoxyphene Other (See Comments)    Reaction:  GI upset   . Sulfa Antibiotics Rash and Other (See Comments)    Reaction:  GI upset     DISCHARGE MEDICATIONS:   Current Discharge Medication List    START taking these medications   Details  levofloxacin (LEVAQUIN) 750 MG tablet Take 1 tablet (750 mg total) by mouth daily. Qty: 5 tablet, Refills: 0      CONTINUE these medications which have NOT CHANGED   Details  acetaminophen (TYLENOL) 500 MG tablet Take 1,000 mg by mouth every 6 (six) hours as needed for mild pain or headache.     albuterol (PROVENTIL HFA;VENTOLIN HFA) 108 (90 BASE) MCG/ACT inhaler Inhale 2 puffs into the lungs every 4 (four) hours as needed for wheezing or shortness of breath. Qty: 18 g, Refills: 3    albuterol (PROVENTIL) (2.5 MG/3ML) 0.083% nebulizer solution Take 3 mLs (2.5 mg total) by nebulization every 4 (four) hours as needed for wheezing. Qty: 75 mL, Refills: 12    ALPRAZolam (XANAX) 0.5 MG tablet Take 0.5 mg by mouth every 4 (four) hours as needed for anxiety.     benzonatate (TESSALON) 200 MG capsule Take 1 capsule (  200 mg total) by mouth 3 (three) times daily as needed for cough. Qty: 20 capsule, Refills: 0    celecoxib (CELEBREX) 200 MG capsule Take 200 mg by mouth daily.    clopidogrel (PLAVIX) 75 MG tablet Take 75 mg by mouth daily.    diphenoxylate-atropine (LOMOTIL) 2.5-0.025 MG per tablet Take 2 tablets by mouth 4 (four) times daily as needed for diarrhea or loose stools.     donepezil (ARICEPT) 10 MG tablet Take 10 mg by mouth at bedtime.    estropipate (OGEN) 0.75 MG tablet Take 1 tablet (0.75 mg total) by mouth daily. Qty: 30 tablet, Refills: 6    fentaNYL (DURAGESIC - DOSED MCG/HR)  50 MCG/HR Place 1 patch (50 mcg total) onto the skin every 3 (three) days. Qty: 10 patch, Refills: 0   Associated Diagnoses: Chronic pain syndrome    Fluticasone-Salmeterol (ADVAIR) 250-50 MCG/DOSE AEPB Inhale 1 puff into the lungs 2 (two) times daily.    furosemide (LASIX) 40 MG tablet Take 40 mg by mouth daily.    guaiFENesin-dextromethorphan (ROBITUSSIN DM) 100-10 MG/5ML syrup Take 5 mLs by mouth every 4 (four) hours as needed for cough. Qty: 118 mL, Refills: 0    mupirocin cream (BACTROBAN) 2 % Apply 1 application topically 2 (two) times daily.     pantoprazole (PROTONIX) 40 MG tablet Take 40 mg by mouth daily.     pregabalin (LYRICA) 150 MG capsule Take 1 capsule (150 mg total) by mouth 2 (two) times daily. Qty: 60 capsule, Refills: 2   Associated Diagnoses: Neuropathy (HCC)    silver sulfADIAZINE (SILVADENE) 1 % cream Apply 1 application topically daily.     simvastatin (ZOCOR) 10 MG tablet Take 10 mg by mouth at bedtime.     SUMAtriptan (IMITREX) 25 MG tablet Take 25 mg by mouth as needed for migraine. May repeat in 2 hours if headache persists or recurs.    tiotropium (SPIRIVA) 18 MCG inhalation capsule Place 1 capsule (18 mcg total) into inhaler and inhale daily. Qty: 30 capsule, Refills: 12    traMADol (ULTRAM) 50 MG tablet Take 100 mg by mouth 4 (four) times daily.      STOP taking these medications     amoxicillin-clavulanate (AUGMENTIN) 875-125 MG tablet         If you experience worsening of your admission symptoms, develop shortness of breath, life threatening emergency, suicidal or homicidal thoughts you must seek medical attention immediately by calling 911 or calling your MD immediately  if symptoms less severe.  You Must read complete instructions/literature along with all the possible adverse reactions/side effects for all the Medicines you take and that have been prescribed to you. Take any new Medicines after you have completely understood and accept all  the possible adverse reactions/side effects.   Please note  You were cared for by a hospitalist during your hospital stay. If you have any questions about your discharge medications or the care you received while you were in the hospital after you are discharged, you can call the unit and asked to speak with the hospitalist on call if the hospitalist that took care of you is not available. Once you are discharged, your primary care physician will handle any further medical issues. Please note that NO REFILLS for any discharge medications will be authorized once you are discharged, as it is imperative that you return to your primary care physician (or establish a relationship with a primary care physician if you do not have one) for your  aftercare needs so that they can reassess your need for medications and monitor your lab values. Today   SUBJECTIVE   Now new complaints  VITAL SIGNS:  Blood pressure 125/52, pulse 84, temperature 97.6 F (36.4 C), temperature source Oral, resp. rate 18, height  (1.575 m), weight 104 lb 9.6 oz (47.446 kg), SpO2 94 %.  I/O:   Intake/Output Summary (Last 24 hours) at 11/11/15 1243 Last data filed at 11/11/15 1050  Gross per 24 hour  Intake   4352 ml  Output    502 ml  Net   3850 ml    PHYSICAL EXAMINATION:  GENERAL:  64 y.o.-year-old patient lying in the bed with no acute distress.  EYES: Pupils equal, round, reactive to light and accommodation. No scleral icterus. Extraocular muscles intact.  HEENT: Head atraumatic, normocephalic. Oropharynx and nasopharynx clear.  NECK:  Supple, no jugular venous distention. No thyroid enlargement, no tenderness.  LUNGS: distant breath sounds bilaterally, no wheezing, rales,rhonchi or crepitation. No use of accessory muscles of respiration.  CARDIOVASCULAR: S1, S2 normal. No murmurs, rubs, or gallops.  ABDOMEN: Soft, non-tender, non-distended. Bowel sounds present. No organomegaly or mass.  EXTREMITIES: No pedal  edema, cyanosis, or clubbing.  NEUROLOGIC: Cranial nerves II through XII are intact. Muscle strength 5/5 in all extremities. Sensation intact. Gait not checked.  PSYCHIATRIC: The patient is alert and oriented x 3.  SKIN: No obvious rash, lesion, or ulcer.   DATA REVIEW:   CBC   Recent Labs Lab 11/10/15 0200  WBC 10.1  HGB 8.9*  HCT 27.9*  PLT 252    Chemistries   Recent Labs Lab 11/09/15 2044 11/10/15 0200  NA 136 139  K 3.3* 3.6  CL 98* 104  CO2 29 27  GLUCOSE 118* 109*  BUN 16 14  CREATININE 0.97 0.85  CALCIUM 8.3* 7.4*  AST 17  --   ALT 10*  --   ALKPHOS 104  --   BILITOT 0.5  --     Microbiology Results   Recent Results (from the past 240 hour(s))  Blood Culture (routine x 2)     Status: None (Preliminary result)   Collection Time: 11/09/15  8:25 PM  Result Value Ref Range Status   Specimen Description BLOOD RIGHT ANTECUBITAL  Final   Special Requests BOTTLES DRAWN AEROBIC AND ANAEROBIC  Final   Culture NO GROWTH 2 DAYS  Final   Report Status PENDING  Incomplete  Blood Culture (routine x 2)     Status: None (Preliminary result)   Collection Time: 11/09/15  8:30 PM  Result Value Ref Range Status   Specimen Description BLOOD LEFT HAND  Final   Special Requests BOTTLES DRAWN AEROBIC AND ANAEROBIC  Final   Culture NO GROWTH 2 DAYS  Final   Report Status PENDING  Incomplete  Urine culture     Status: None (Preliminary result)   Collection Time: 11/09/15  8:40 PM  Result Value Ref Range Status   Specimen Description URINE, RANDOM  Final   Special Requests NONE  Final   Culture NO GROWTH 1 DAY  Final   Report Status PENDING  Incomplete  MRSA PCR Screening     Status: None   Collection Time: 11/10/15  2:20 AM  Result Value Ref Range Status   MRSA by PCR NEGATIVE NEGATIVE Final    Comment:        The GeneXpert MRSA Assay (FDA approved for NASAL specimens only), is one component of a comprehensive  MRSA colonization surveillance program. It is  not intended to diagnose MRSA infection nor to guide or monitor treatment for MRSA infections.     RADIOLOGY:  Dg Chest Port 1 View  11/09/2015  CLINICAL DATA:  Recently discharged from hospital for pneumonia. Vomiting at home. Patient is been lethargic and a time date. Responsive to pain but nonverbal. Fever. EXAM: PORTABLE CHEST 1 VIEW COMPARISON:  10/20/2015 FINDINGS: Cardiac enlargement without significant vascular congestion. Airspace disease in the right lower lung consistent with pneumonia. Linear atelectasis in the left mid lung. No blunting of costophrenic angles. No pneumothorax. Calcified and tortuous aorta. Mediastinal contours appear intact. IMPRESSION: Cardiac enlargement with mild pulmonary vascular congestion. Airspace disease in the right lower lung consistent with pneumonia. Electronically Signed   By: Burman Nieves M.D.   On: 11/09/2015 20:58     Management plans discussed with the patient, family and they are in agreement.  CODE STATUS:     Code Status Orders        Start     Ordered   11/09/15 2302  Full code   Continuous     11/09/15 2302      TOTAL TIME TAKING CARE OF THIS PATIENT: 40 minutes.    Remijio Holleran M.D on 11/11/2015 at 12:43 PM  Between 7am to 6pm - Pager - [phone] After 6pm go to [email or web address] - password [email or web address] [facility]  [facility]  Office  [phone]  CC: Primary care physician; Mila Merry, MD

## 2015-11-12 LAB — URINE CULTURE: CULTURE: NO GROWTH

## 2015-11-12 NOTE — Progress Notes (Signed)
Ambulated with walker to RM 116 and returned to bed with comfort measures performed. Tolerated well.

## 2015-11-12 NOTE — Progress Notes (Addendum)
Pt has been quieter today. Up in chair x 2. Ambulated in hall with walker with SB+ tolerated well, but was tired upon return to room. Color pale. Rare occasion loose cough with scant amount sputum produced. Afebrile.

## 2015-11-12 NOTE — Progress Notes (Signed)
[facility] [place] at [facility]   PATIENT NAME: Cheryl DimmerMary [name]    MR#:  [id number]  DATE OF BIRTH:  [birth year]  SUBJECTIVE:  Came in with increasing shortness of breath and cough found to have a right lower lobe pneumonia. Uses oxygen as needed at home. Doing well today. Room air sats are stable. Husband is at bedside. REVIEW OF SYSTEMS:   Review of Systems  Constitutional: Negative for fever, chills and weight loss.  HENT: Negative for ear discharge, ear pain and nosebleeds.   Eyes: Negative for blurred vision, pain and discharge.  Respiratory: Positive for cough and sputum production. Negative for shortness of breath, wheezing and stridor.   Cardiovascular: Negative for chest pain, palpitations, orthopnea and PND.  Gastrointestinal: Negative for nausea, vomiting, abdominal pain and diarrhea.  Genitourinary: Negative for urgency and frequency.  Musculoskeletal: Negative for back pain and joint pain.  Neurological: Positive for weakness. Negative for sensory change, speech change and focal weakness.  Psychiatric/Behavioral: Negative for depression and hallucinations. The patient is not nervous/anxious.   All other systems reviewed and are negative.  Tolerating Diet:yes Tolerating PT: Recommends rehabilitation  DRUG ALLERGIES:   Allergies  Allergen Reactions  . Percocet [Oxycodone-Acetaminophen] Hives  . Aspirin Nausea And Vomiting and Other (See Comments)    Reaction:  GI upset   . Codeine Nausea And Vomiting and Other (See Comments)    Reaction:  GI upset   . Morphine Other (See Comments)    Reaction:  Unknown   . Pregabalin Other (See Comments)    Reaction:  Makes pt sleep excessively.    . Propoxyphene Other (See Comments)    Reaction:  GI upset   . Sulfa Antibiotics Rash and Other (See Comments)    Reaction:  GI upset     VITALS:  Blood pressure 135/63, pulse 77, temperature 98.7 F (37.1 C), temperature source Oral, resp. rate 18, height 5'  2" (1.575 m), weight 47.446 kg (104 lb 9.6 oz), SpO2 98 %.  PHYSICAL EXAMINATION:   Physical Exam  GENERAL:  64 y.o.-year-old patient lying in the bed with no acute distress. Thin cachectic EYES: Pupils equal, round, reactive to light and accommodation. No scleral icterus. Extraocular muscles intact.  HEENT: Head atraumatic, normocephalic. Oropharynx and nasopharynx clear.  NECK:  Supple, no jugular venous distention. No thyroid enlargement, no tenderness.  LUNGS: Decreased breath sounds bilaterally, no wheezing, no rales, rhonchi. No use of accessory muscles of respiration.  CARDIOVASCULAR: S1, S2 normal. No murmurs, rubs, or gallops.  ABDOMEN: Soft, nontender, nondistended. Bowel sounds present. No organomegaly or mass.  EXTREMITIES: No cyanosis, clubbing or edema b/l.    NEUROLOGIC: Cranial nerves II through XII are intact. No focal Motor or sensory deficits b/l.   PSYCHIATRIC:  patient is alert and oriented x 3.  SKIN: No obvious rash, lesion, or ulcer.   LABORATORY PANEL:   CBC  Recent Labs Lab 11/10/15 0200  WBC 10.1  HGB 8.9*  HCT 27.9*  PLT 252    Chemistries   Recent Labs Lab 11/09/15 2044 11/10/15 0200  NA 136 139  K 3.3* 3.6  CL 98* 104  CO2 29 27  GLUCOSE 118* 109*  BUN 16 14  CREATININE 0.97 0.85  CALCIUM 8.3* 7.4*  AST 17  --   ALT 10*  --   ALKPHOS 104  --   BILITOT 0.5  --     Cardiac Enzymes  Recent Labs Lab 11/09/15 2044  TROPONINI 0.04*  RADIOLOGY:  No results found. ASSESSMENT AND PLAN:   64 year old Caucasian female history of COPD oxygen requiring presenting with fever  1.Sepsis, meeting septic criteria by heart rate, respiratory rate, temperature present on arrival. Source healthcare associated pneumonia  -was on  vancomycin/Zosyn/Levaquin and tapered antibiotics to levaquin -BC neg so far  2. COPD not in acute exacerbation, no wheezing:  -Continue with oxygen as above as well as home medications  3. GERD without  esophagitis PPI therapy  4. Hyperlipidemia unspecified statin therapy  5. Venous thromboembolism prophylactic: Heparin subcutaneous  6.PTrecommends rehabilitation.  Care management for discharge planning. Patient is ready for discharge once bed available. Social worker aware. Patient and husband agreeable.  Case discussed with Care Management/Social Worker. Management plans discussed with the patient, family and they are in agreement.  CODE STATUS: full  DVT Prophylaxis: lovenox  TOTAL TIME TAKING CARE OF THIS PATIENT: 30 minutes.  >50% time spent on counselling and coordination of care  POSSIBLE D/C IN am DEPENDING ON CLINICAL CONDITION.   Cheryl [name] M.D on 11/12/2015 at 12:37 PM  Between 7am to 6pm - Pager - [phone]  After 6pm go to [email or web address] - password [email or web address] Cheryl [name]  [place]  Office  [phone]  CC: Primary care physician; Cheryl Merry, MD

## 2015-11-13 DIAGNOSIS — R110 Nausea: Secondary | ICD-10-CM | POA: Diagnosis not present

## 2015-11-13 DIAGNOSIS — I110 Hypertensive heart disease with heart failure: Secondary | ICD-10-CM | POA: Diagnosis present

## 2015-11-13 DIAGNOSIS — R1310 Dysphagia, unspecified: Secondary | ICD-10-CM | POA: Diagnosis present

## 2015-11-13 DIAGNOSIS — E7800 Pure hypercholesterolemia, unspecified: Secondary | ICD-10-CM | POA: Diagnosis present

## 2015-11-13 DIAGNOSIS — Z86718 Personal history of other venous thrombosis and embolism: Secondary | ICD-10-CM | POA: Diagnosis not present

## 2015-11-13 DIAGNOSIS — Z9981 Dependence on supplemental oxygen: Secondary | ICD-10-CM | POA: Diagnosis not present

## 2015-11-13 DIAGNOSIS — D638 Anemia in other chronic diseases classified elsewhere: Secondary | ICD-10-CM | POA: Diagnosis present

## 2015-11-13 DIAGNOSIS — E440 Moderate protein-calorie malnutrition: Secondary | ICD-10-CM | POA: Diagnosis not present

## 2015-11-13 DIAGNOSIS — Z888 Allergy status to other drugs, medicaments and biological substances status: Secondary | ICD-10-CM | POA: Diagnosis not present

## 2015-11-13 DIAGNOSIS — G8929 Other chronic pain: Secondary | ICD-10-CM | POA: Diagnosis present

## 2015-11-13 DIAGNOSIS — Z79899 Other long term (current) drug therapy: Secondary | ICD-10-CM | POA: Diagnosis not present

## 2015-11-13 DIAGNOSIS — Z736 Limitation of activities due to disability: Secondary | ICD-10-CM | POA: Diagnosis not present

## 2015-11-13 DIAGNOSIS — I2510 Atherosclerotic heart disease of native coronary artery without angina pectoris: Secondary | ICD-10-CM | POA: Diagnosis present

## 2015-11-13 DIAGNOSIS — E785 Hyperlipidemia, unspecified: Secondary | ICD-10-CM | POA: Diagnosis present

## 2015-11-13 DIAGNOSIS — K219 Gastro-esophageal reflux disease without esophagitis: Secondary | ICD-10-CM | POA: Diagnosis not present

## 2015-11-13 DIAGNOSIS — G894 Chronic pain syndrome: Secondary | ICD-10-CM | POA: Diagnosis present

## 2015-11-13 DIAGNOSIS — E559 Vitamin D deficiency, unspecified: Secondary | ICD-10-CM | POA: Diagnosis present

## 2015-11-13 DIAGNOSIS — I10 Essential (primary) hypertension: Secondary | ICD-10-CM | POA: Diagnosis not present

## 2015-11-13 DIAGNOSIS — Z885 Allergy status to narcotic agent status: Secondary | ICD-10-CM | POA: Diagnosis not present

## 2015-11-13 DIAGNOSIS — G609 Hereditary and idiopathic neuropathy, unspecified: Secondary | ICD-10-CM | POA: Diagnosis not present

## 2015-11-13 DIAGNOSIS — R2681 Unsteadiness on feet: Secondary | ICD-10-CM | POA: Diagnosis not present

## 2015-11-13 DIAGNOSIS — J189 Pneumonia, unspecified organism: Secondary | ICD-10-CM | POA: Diagnosis not present

## 2015-11-13 DIAGNOSIS — Z9181 History of falling: Secondary | ICD-10-CM | POA: Diagnosis not present

## 2015-11-13 DIAGNOSIS — R6521 Severe sepsis with septic shock: Secondary | ICD-10-CM | POA: Diagnosis not present

## 2015-11-13 DIAGNOSIS — J440 Chronic obstructive pulmonary disease with acute lower respiratory infection: Secondary | ICD-10-CM | POA: Diagnosis not present

## 2015-11-13 DIAGNOSIS — G43909 Migraine, unspecified, not intractable, without status migrainosus: Secondary | ICD-10-CM | POA: Diagnosis present

## 2015-11-13 DIAGNOSIS — M810 Age-related osteoporosis without current pathological fracture: Secondary | ICD-10-CM | POA: Diagnosis present

## 2015-11-13 DIAGNOSIS — N390 Urinary tract infection, site not specified: Secondary | ICD-10-CM | POA: Diagnosis not present

## 2015-11-13 DIAGNOSIS — I739 Peripheral vascular disease, unspecified: Secondary | ICD-10-CM | POA: Diagnosis present

## 2015-11-13 DIAGNOSIS — A419 Sepsis, unspecified organism: Secondary | ICD-10-CM | POA: Diagnosis not present

## 2015-11-13 DIAGNOSIS — R0902 Hypoxemia: Secondary | ICD-10-CM | POA: Diagnosis not present

## 2015-11-13 DIAGNOSIS — Y95 Nosocomial condition: Secondary | ICD-10-CM | POA: Diagnosis present

## 2015-11-13 DIAGNOSIS — I5033 Acute on chronic diastolic (congestive) heart failure: Secondary | ICD-10-CM | POA: Diagnosis not present

## 2015-11-13 DIAGNOSIS — Z882 Allergy status to sulfonamides status: Secondary | ICD-10-CM | POA: Diagnosis not present

## 2015-11-13 DIAGNOSIS — F419 Anxiety disorder, unspecified: Secondary | ICD-10-CM | POA: Diagnosis not present

## 2015-11-13 DIAGNOSIS — F0390 Unspecified dementia without behavioral disturbance: Secondary | ICD-10-CM | POA: Diagnosis present

## 2015-11-13 DIAGNOSIS — E872 Acidosis: Secondary | ICD-10-CM | POA: Diagnosis not present

## 2015-11-13 DIAGNOSIS — J449 Chronic obstructive pulmonary disease, unspecified: Secondary | ICD-10-CM | POA: Diagnosis not present

## 2015-11-13 DIAGNOSIS — J9621 Acute and chronic respiratory failure with hypoxia: Secondary | ICD-10-CM | POA: Diagnosis not present

## 2015-11-13 DIAGNOSIS — J45909 Unspecified asthma, uncomplicated: Secondary | ICD-10-CM | POA: Diagnosis present

## 2015-11-13 DIAGNOSIS — M6281 Muscle weakness (generalized): Secondary | ICD-10-CM | POA: Diagnosis not present

## 2015-11-13 DIAGNOSIS — G309 Alzheimer's disease, unspecified: Secondary | ICD-10-CM | POA: Diagnosis not present

## 2015-11-13 DIAGNOSIS — F17210 Nicotine dependence, cigarettes, uncomplicated: Secondary | ICD-10-CM | POA: Diagnosis not present

## 2015-11-13 DIAGNOSIS — Z681 Body mass index (BMI) 19 or less, adult: Secondary | ICD-10-CM | POA: Diagnosis not present

## 2015-11-13 DIAGNOSIS — J80 Acute respiratory distress syndrome: Secondary | ICD-10-CM | POA: Diagnosis not present

## 2015-11-13 DIAGNOSIS — J690 Pneumonitis due to inhalation of food and vomit: Secondary | ICD-10-CM | POA: Diagnosis not present

## 2015-11-13 LAB — PLATELET COUNT: PLATELETS: 262 10*3/uL (ref 150–440)

## 2015-11-13 MED ORDER — GUAIFENESIN-DM 100-10 MG/5ML PO SYRP: 5.0000 mL | ORAL_SOLUTION | ORAL | Status: DC | PRN

## 2015-11-13 MED ORDER — ALBUTEROL SULFATE HFA 108 (90 BASE) MCG/ACT IN AERS: 2.0000 | INHALATION_SPRAY | RESPIRATORY_TRACT | Status: DC | PRN

## 2015-11-13 MED ORDER — LEVOFLOXACIN 750 MG PO TABS
750.0000 mg | ORAL_TABLET | ORAL | Status: DC
  Administered 2015-11-13: 09:00:00 750 mg via ORAL
  Filled 2015-11-13: qty 1

## 2015-11-13 MED ORDER — CELECOXIB 200 MG PO CAPS
200.0000 mg | ORAL_CAPSULE | Freq: Every day | ORAL | Status: DC
  Administered 2015-11-13: 09:00:00 200 mg via ORAL
  Filled 2015-11-13: qty 1

## 2015-11-13 MED ORDER — ALBUTEROL SULFATE (2.5 MG/3ML) 0.083% IN NEBU
2.5000 mg | INHALATION_SOLUTION | RESPIRATORY_TRACT | Status: DC | PRN
Start: 2015-11-13 — End: 2015-11-13

## 2015-11-13 MED ORDER — SILVER SULFADIAZINE 1 % EX CREA
1.0000 "application " | TOPICAL_CREAM | Freq: Every day | CUTANEOUS | Status: DC
  Administered 2015-11-13: 1 via TOPICAL
  Filled 2015-11-13: qty 25

## 2015-11-13 NOTE — Discharge Summary (Signed)
[facility] - [place] at [facility]   PATIENT NAME: Cheryl [name]    MR#:  [id number]  DATE OF BIRTH:  01-18-1951  DATE OF ADMISSION:  11/09/2015 ADMITTING PHYSICIAN: Wyatt Haste, MD  DATE OF DISCHARGE: 11/11/15  PRIMARY CARE PHYSICIAN: Mila Merry, MD    ADMISSION DIAGNOSIS:  Healthcare-associated pneumonia [J18.9] Sepsis, due to unspecified organism (HCC) [A41.9]  DISCHARGE DIAGNOSIS:  Sepsis due to right LL Pneumonia COPD with tobacco abuse GERD Ongoing tobacco abuse SECONDARY DIAGNOSIS:   Past Medical History  Diagnosis Date  . COPD (chronic obstructive pulmonary disease) (HCC)   . Hypertension   . Arthritis   . Hyperlipidemia   . GERD (gastroesophageal reflux disease)   . Neuropathy (HCC) 2010  . Oxygen deficiency   . Coronary artery disease   . Headache   . Anxiety   . Peripheral vascular disease (HCC)   . Vitamin D deficiency   . Migraines   . Chronic pain   . DVT (deep venous thrombosis) (HCC)   . Pneumonia   . Osteoporosis   . Allergy   . Asthma     HOSPITAL COURSE:   64 year old Caucasian female history of COPD oxygen requiring presenting with fever  1.Sepsis, meeting septic criteria by heart rate, respiratory rate, temperature present on arrival. Source healthcare associated pneumonia  -was on vancomycin/Zosyn/Levaquin and taper antibiotics to levaquin -BC neg so far -wean oxygen to RA. She uses oxygen at home as needed -doing well,afebrile  2. COPD not in acute exacerbation, no wheezing:  -Continue with oxygen as above as well as home medications  3. GERD without esophagitis PPI therapy  4. Hyperlipidemia unspecified statin therapy  5. Venous thromboembolism prophylactic: Heparin subcutaneous  6.PT recommneds STR Will d/c to rehab  D/w family  CONSULTS OBTAINED:  Treatment Team:  Wyatt Haste, MD  DRUG ALLERGIES:   Allergies  Allergen Reactions  . Percocet [Oxycodone-Acetaminophen] Hives  .  Aspirin Nausea And Vomiting and Other (See Comments)    Reaction:  GI upset   . Codeine Nausea And Vomiting and Other (See Comments)    Reaction:  GI upset   . Morphine Other (See Comments)    Reaction:  Unknown   . Pregabalin Other (See Comments)    Reaction:  Makes pt sleep excessively.    . Propoxyphene Other (See Comments)    Reaction:  GI upset   . Sulfa Antibiotics Rash and Other (See Comments)    Reaction:  GI upset     DISCHARGE MEDICATIONS:   Current Discharge Medication List    START taking these medications   Details  levofloxacin (LEVAQUIN) 750 MG tablet Take 1 tablet (750 mg total) by mouth daily. Qty: 5 tablet, Refills: 0      CONTINUE these medications which have NOT CHANGED   Details  acetaminophen (TYLENOL) 500 MG tablet Take 1,000 mg by mouth every 6 (six) hours as needed for mild pain or headache.     albuterol (PROVENTIL HFA;VENTOLIN HFA) 108 (90 BASE) MCG/ACT inhaler Inhale 2 puffs into the lungs every 4 (four) hours as needed for wheezing or shortness of breath. Qty: 18 g, Refills: 3    albuterol (PROVENTIL) (2.5 MG/3ML) 0.083% nebulizer solution Take 3 mLs (2.5 mg total) by nebulization every 4 (four) hours as needed for wheezing. Qty: 75 mL, Refills: 12    ALPRAZolam (XANAX) 0.5 MG tablet Take 0.5 mg by mouth every 4 (four) hours as needed for anxiety.     benzonatate (  TESSALON) 200 MG capsule Take 1 capsule (200 mg total) by mouth 3 (three) times daily as needed for cough. Qty: 20 capsule, Refills: 0    celecoxib (CELEBREX) 200 MG capsule Take 200 mg by mouth daily.    clopidogrel (PLAVIX) 75 MG tablet Take 75 mg by mouth daily.    diphenoxylate-atropine (LOMOTIL) 2.5-0.025 MG per tablet Take 2 tablets by mouth 4 (four) times daily as needed for diarrhea or loose stools.     donepezil (ARICEPT) 10 MG tablet Take 10 mg by mouth at bedtime.    estropipate (OGEN) 0.75 MG tablet Take 1 tablet (0.75 mg total) by mouth daily. Qty: 30 tablet, Refills:  6    fentaNYL (DURAGESIC - DOSED MCG/HR) 50 MCG/HR Place 1 patch (50 mcg total) onto the skin every 3 (three) days. Qty: 10 patch, Refills: 0   Associated Diagnoses: Chronic pain syndrome    Fluticasone-Salmeterol (ADVAIR) 250-50 MCG/DOSE AEPB Inhale 1 puff into the lungs 2 (two) times daily.    furosemide (LASIX) 40 MG tablet Take 40 mg by mouth daily.    guaiFENesin-dextromethorphan (ROBITUSSIN DM) 100-10 MG/5ML syrup Take 5 mLs by mouth every 4 (four) hours as needed for cough. Qty: 118 mL, Refills: 0    mupirocin cream (BACTROBAN) 2 % Apply 1 application topically 2 (two) times daily.     pantoprazole (PROTONIX) 40 MG tablet Take 40 mg by mouth daily.     pregabalin (LYRICA) 150 MG capsule Take 1 capsule (150 mg total) by mouth 2 (two) times daily. Qty: 60 capsule, Refills: 2   Associated Diagnoses: Neuropathy (HCC)    silver sulfADIAZINE (SILVADENE) 1 % cream Apply 1 application topically daily.     simvastatin (ZOCOR) 10 MG tablet Take 10 mg by mouth at bedtime.     SUMAtriptan (IMITREX) 25 MG tablet Take 25 mg by mouth as needed for migraine. May repeat in 2 hours if headache persists or recurs.    tiotropium (SPIRIVA) 18 MCG inhalation capsule Place 1 capsule (18 mcg total) into inhaler and inhale daily. Qty: 30 capsule, Refills: 12    traMADol (ULTRAM) 50 MG tablet Take 100 mg by mouth 4 (four) times daily.      STOP taking these medications     amoxicillin-clavulanate (AUGMENTIN) 875-125 MG tablet         If you experience worsening of your admission symptoms, develop shortness of breath, life threatening emergency, suicidal or homicidal thoughts you must seek medical attention immediately by calling 911 or calling your MD immediately  if symptoms less severe.  You Must read complete instructions/literature along with all the possible adverse reactions/side effects for all the Medicines you take and that have been prescribed to you. Take any new Medicines after you  have completely understood and accept all the possible adverse reactions/side effects.   Please note  You were cared for by a hospitalist during your hospital stay. If you have any questions about your discharge medications or the care you received while you were in the hospital after you are discharged, you can call the unit and asked to speak with the hospitalist on call if the hospitalist that took care of you is not available. Once you are discharged, your primary care physician will handle any further medical issues. Please note that NO REFILLS for any discharge medications will be authorized once you are discharged, as it is imperative that you return to your primary care physician (or establish a relationship with a primary care physician if  you do not have one) for your aftercare needs so that they can reassess your need for medications and monitor your lab values. Today   SUBJECTIVE   Now new complaints  VITAL SIGNS:  Blood pressure 159/61, pulse 65, temperature 97.7 F (36.5 C), temperature source Oral, resp. rate 20, height  (1.575 m), weight 47.446 kg (104 lb 9.6 oz), SpO2 98 %.  I/O:    Intake/Output Summary (Last 24 hours) at 11/13/15 0756 Last data filed at 11/12/15 1900  Gross per 24 hour  Intake    360 ml  Output      0 ml  Net    360 ml    PHYSICAL EXAMINATION:  GENERAL:  64 y.o.-year-old patient lying in the bed with no acute distress.  EYES: Pupils equal, round, reactive to light and accommodation. No scleral icterus. Extraocular muscles intact.  HEENT: Head atraumatic, normocephalic. Oropharynx and nasopharynx clear.  NECK:  Supple, no jugular venous distention. No thyroid enlargement, no tenderness.  LUNGS: distant breath sounds bilaterally, no wheezing, rales,rhonchi or crepitation. No use of accessory muscles of respiration.  CARDIOVASCULAR: S1, S2 normal. No murmurs, rubs, or gallops.  ABDOMEN: Soft, non-tender, non-distended. Bowel sounds present. No  organomegaly or mass.  EXTREMITIES: No pedal edema, cyanosis, or clubbing.  NEUROLOGIC: Cranial nerves II through XII are intact. Muscle strength 5/5 in all extremities. Sensation intact. Gait not checked.  PSYCHIATRIC:  patient is alert and oriented x 3.  SKIN: No obvious rash, lesion, or ulcer.   DATA REVIEW:   CBC   Recent Labs Lab 11/10/15 0200 11/13/15 0512  WBC 10.1  --   HGB 8.9*  --   HCT 27.9*  --   PLT 252 262    Chemistries   Recent Labs Lab 11/09/15 2044 11/10/15 0200  NA 136 139  K 3.3* 3.6  CL 98* 104  CO2 29 27  GLUCOSE 118* 109*  BUN 16 14  CREATININE 0.97 0.85  CALCIUM 8.3* 7.4*  AST 17  --   ALT 10*  --   ALKPHOS 104  --   BILITOT 0.5  --     Microbiology Results   Recent Results (from the past 240 hour(s))  Blood Culture (routine x 2)     Status: None (Preliminary result)   Collection Time: 11/09/15  8:25 PM  Result Value Ref Range Status   Specimen Description BLOOD RIGHT ANTECUBITAL  Final   Special Requests BOTTLES DRAWN AEROBIC AND ANAEROBIC  Final   Culture NO GROWTH 3 DAYS  Final   Report Status PENDING  Incomplete  Blood Culture (routine x 2)     Status: None (Preliminary result)   Collection Time: 11/09/15  8:30 PM  Result Value Ref Range Status   Specimen Description BLOOD LEFT HAND  Final   Special Requests BOTTLES DRAWN AEROBIC AND ANAEROBIC  Final   Culture NO GROWTH 3 DAYS  Final   Report Status PENDING  Incomplete  Urine culture     Status: None   Collection Time: 11/09/15  8:40 PM  Result Value Ref Range Status   Specimen Description URINE, RANDOM  Final   Special Requests NONE  Final   Culture NO GROWTH 2 DAYS  Final   Report Status 11/12/2015 FINAL  Final  MRSA PCR Screening     Status: None   Collection Time: 11/10/15  2:20 AM  Result Value Ref Range Status   MRSA by PCR NEGATIVE NEGATIVE Final    Comment:  The GeneXpert MRSA Assay (FDA approved for NASAL specimens only), is one component of  a comprehensive MRSA colonization surveillance program. It is not intended to diagnose MRSA infection nor to guide or monitor treatment for MRSA infections.     RADIOLOGY:  No results found.   Management plans discussed with the patient, family and they are in agreement.  CODE STATUS:     Code Status Orders        Start     Ordered   11/09/15 2302  Full code   Continuous     11/09/15 2302      TOTAL TIME TAKING CARE OF THIS PATIENT: 40 minutes.    Kolbee Stallman M.D on 11/13/2015 at 7:56 AM  Between 7am to 6pm - Pager - [phone] After 6pm go to [email or web address] - password [email or web address] [facility]  [facility]  Office  [phone]  CC: Primary care physician; Mila Merryonald Fisher, MD

## 2015-11-13 NOTE — Progress Notes (Signed)
Pt  Discharged at this time to [facility]. Alert. Husband transported pt by car.   Report called to abby at facility. No voiced c/o.

## 2015-11-13 NOTE — Care Management Important Message (Signed)
Important Message  Patient Details  Name: Cheryl [name] MRN: [id number] Date of Birth: July 11, 1951   Medicare Important Message Given:  Yes    Olegario MessierKathy A Feliz Herard 11/13/2015, 9:39 AM

## 2015-11-13 NOTE — Clinical Social Work Note (Signed)
Pt is ready for discharge today to [facility]. [employer] Ber[employer] Harvey[employer] has been obtained. Facility has received all discharge information and is ready to admit pt. RN called report and pt's husband provided transportation. CSW is signing off as no further needs identified.   Dede QuerySarah Pernie Grosso, MSW, LCSW Clinical Social Worker  [phone]

## 2015-11-14 DIAGNOSIS — I10 Essential (primary) hypertension: Secondary | ICD-10-CM | POA: Diagnosis not present

## 2015-11-14 DIAGNOSIS — A419 Sepsis, unspecified organism: Secondary | ICD-10-CM | POA: Diagnosis not present

## 2015-11-14 DIAGNOSIS — F419 Anxiety disorder, unspecified: Secondary | ICD-10-CM | POA: Diagnosis not present

## 2015-11-14 DIAGNOSIS — J449 Chronic obstructive pulmonary disease, unspecified: Secondary | ICD-10-CM | POA: Diagnosis not present

## 2015-11-14 DIAGNOSIS — G309 Alzheimer's disease, unspecified: Secondary | ICD-10-CM | POA: Diagnosis not present

## 2015-11-14 DIAGNOSIS — J690 Pneumonitis due to inhalation of food and vomit: Secondary | ICD-10-CM | POA: Diagnosis not present

## 2015-11-14 DIAGNOSIS — K219 Gastro-esophageal reflux disease without esophagitis: Secondary | ICD-10-CM | POA: Diagnosis not present

## 2015-11-14 LAB — CULTURE, BLOOD (ROUTINE X 2)
CULTURE: NO GROWTH
Culture: NO GROWTH

## 2015-11-19 ENCOUNTER — Inpatient Hospital Stay
Admission: EM | Admit: 2015-11-19 | Discharge: 2015-11-24 | DRG: 871 | Disposition: A | Discharge status: Skilled Nursing Facility | Payer: Commercial Managed Care - HMO | Attending: Internal Medicine | Admitting: Internal Medicine

## 2015-11-19 ENCOUNTER — Emergency Department: Payer: Commercial Managed Care - HMO

## 2015-11-19 DIAGNOSIS — G8929 Other chronic pain: Secondary | ICD-10-CM | POA: Diagnosis present

## 2015-11-19 DIAGNOSIS — A419 Sepsis, unspecified organism: Secondary | ICD-10-CM | POA: Diagnosis not present

## 2015-11-19 DIAGNOSIS — M810 Age-related osteoporosis without current pathological fracture: Secondary | ICD-10-CM | POA: Diagnosis present

## 2015-11-19 DIAGNOSIS — F17210 Nicotine dependence, cigarettes, uncomplicated: Secondary | ICD-10-CM | POA: Diagnosis not present

## 2015-11-19 DIAGNOSIS — J45909 Unspecified asthma, uncomplicated: Secondary | ICD-10-CM | POA: Diagnosis present

## 2015-11-19 DIAGNOSIS — J440 Chronic obstructive pulmonary disease with acute lower respiratory infection: Secondary | ICD-10-CM | POA: Diagnosis not present

## 2015-11-19 DIAGNOSIS — I5031 Acute diastolic (congestive) heart failure: Secondary | ICD-10-CM

## 2015-11-19 DIAGNOSIS — R279 Unspecified lack of coordination: Secondary | ICD-10-CM | POA: Diagnosis not present

## 2015-11-19 DIAGNOSIS — R0902 Hypoxemia: Secondary | ICD-10-CM | POA: Diagnosis not present

## 2015-11-19 DIAGNOSIS — Z885 Allergy status to narcotic agent status: Secondary | ICD-10-CM | POA: Diagnosis not present

## 2015-11-19 DIAGNOSIS — E440 Moderate protein-calorie malnutrition: Secondary | ICD-10-CM | POA: Diagnosis present

## 2015-11-19 DIAGNOSIS — Z79899 Other long term (current) drug therapy: Secondary | ICD-10-CM

## 2015-11-19 DIAGNOSIS — Z9981 Dependence on supplemental oxygen: Secondary | ICD-10-CM | POA: Diagnosis not present

## 2015-11-19 DIAGNOSIS — D638 Anemia in other chronic diseases classified elsewhere: Secondary | ICD-10-CM | POA: Diagnosis present

## 2015-11-19 DIAGNOSIS — Z86718 Personal history of other venous thrombosis and embolism: Secondary | ICD-10-CM

## 2015-11-19 DIAGNOSIS — I5021 Acute systolic (congestive) heart failure: Secondary | ICD-10-CM | POA: Diagnosis not present

## 2015-11-19 DIAGNOSIS — E872 Acidosis, unspecified: Secondary | ICD-10-CM

## 2015-11-19 DIAGNOSIS — R6521 Severe sepsis with septic shock: Secondary | ICD-10-CM | POA: Diagnosis not present

## 2015-11-19 DIAGNOSIS — R1310 Dysphagia, unspecified: Secondary | ICD-10-CM | POA: Diagnosis present

## 2015-11-19 DIAGNOSIS — Z681 Body mass index (BMI) 19 or less, adult: Secondary | ICD-10-CM

## 2015-11-19 DIAGNOSIS — Z888 Allergy status to other drugs, medicaments and biological substances status: Secondary | ICD-10-CM | POA: Diagnosis not present

## 2015-11-19 DIAGNOSIS — D72829 Elevated white blood cell count, unspecified: Secondary | ICD-10-CM

## 2015-11-19 DIAGNOSIS — J690 Pneumonitis due to inhalation of food and vomit: Secondary | ICD-10-CM | POA: Diagnosis not present

## 2015-11-19 DIAGNOSIS — I5033 Acute on chronic diastolic (congestive) heart failure: Secondary | ICD-10-CM | POA: Diagnosis not present

## 2015-11-19 DIAGNOSIS — E559 Vitamin D deficiency, unspecified: Secondary | ICD-10-CM | POA: Diagnosis present

## 2015-11-19 DIAGNOSIS — G894 Chronic pain syndrome: Secondary | ICD-10-CM | POA: Diagnosis present

## 2015-11-19 DIAGNOSIS — I739 Peripheral vascular disease, unspecified: Secondary | ICD-10-CM | POA: Diagnosis present

## 2015-11-19 DIAGNOSIS — J80 Acute respiratory distress syndrome: Secondary | ICD-10-CM | POA: Diagnosis not present

## 2015-11-19 DIAGNOSIS — G629 Polyneuropathy, unspecified: Secondary | ICD-10-CM | POA: Diagnosis not present

## 2015-11-19 DIAGNOSIS — J449 Chronic obstructive pulmonary disease, unspecified: Secondary | ICD-10-CM | POA: Diagnosis not present

## 2015-11-19 DIAGNOSIS — E785 Hyperlipidemia, unspecified: Secondary | ICD-10-CM | POA: Diagnosis present

## 2015-11-19 DIAGNOSIS — Z882 Allergy status to sulfonamides status: Secondary | ICD-10-CM

## 2015-11-19 DIAGNOSIS — Z9181 History of falling: Secondary | ICD-10-CM | POA: Diagnosis not present

## 2015-11-19 DIAGNOSIS — G43909 Migraine, unspecified, not intractable, without status migrainosus: Secondary | ICD-10-CM | POA: Diagnosis present

## 2015-11-19 DIAGNOSIS — R29898 Other symptoms and signs involving the musculoskeletal system: Secondary | ICD-10-CM | POA: Diagnosis not present

## 2015-11-19 DIAGNOSIS — I110 Hypertensive heart disease with heart failure: Secondary | ICD-10-CM | POA: Diagnosis present

## 2015-11-19 DIAGNOSIS — R2681 Unsteadiness on feet: Secondary | ICD-10-CM | POA: Diagnosis not present

## 2015-11-19 DIAGNOSIS — J189 Pneumonia, unspecified organism: Secondary | ICD-10-CM

## 2015-11-19 DIAGNOSIS — I959 Hypotension, unspecified: Secondary | ICD-10-CM

## 2015-11-19 DIAGNOSIS — E784 Other hyperlipidemia: Secondary | ICD-10-CM | POA: Diagnosis not present

## 2015-11-19 DIAGNOSIS — I2510 Atherosclerotic heart disease of native coronary artery without angina pectoris: Secondary | ICD-10-CM | POA: Diagnosis not present

## 2015-11-19 DIAGNOSIS — E7800 Pure hypercholesterolemia, unspecified: Secondary | ICD-10-CM | POA: Diagnosis present

## 2015-11-19 DIAGNOSIS — Y95 Nosocomial condition: Secondary | ICD-10-CM | POA: Diagnosis present

## 2015-11-19 DIAGNOSIS — F0390 Unspecified dementia without behavioral disturbance: Secondary | ICD-10-CM | POA: Diagnosis present

## 2015-11-19 DIAGNOSIS — J438 Other emphysema: Secondary | ICD-10-CM | POA: Diagnosis not present

## 2015-11-19 DIAGNOSIS — Z736 Limitation of activities due to disability: Secondary | ICD-10-CM | POA: Diagnosis not present

## 2015-11-19 DIAGNOSIS — M6281 Muscle weakness (generalized): Secondary | ICD-10-CM | POA: Diagnosis not present

## 2015-11-19 DIAGNOSIS — K219 Gastro-esophageal reflux disease without esophagitis: Secondary | ICD-10-CM | POA: Diagnosis present

## 2015-11-19 DIAGNOSIS — J9601 Acute respiratory failure with hypoxia: Secondary | ICD-10-CM | POA: Diagnosis not present

## 2015-11-19 DIAGNOSIS — J9621 Acute and chronic respiratory failure with hypoxia: Secondary | ICD-10-CM | POA: Diagnosis not present

## 2015-11-19 DIAGNOSIS — R1312 Dysphagia, oropharyngeal phase: Secondary | ICD-10-CM | POA: Diagnosis not present

## 2015-11-19 HISTORY — DX: Pneumonia, unspecified organism: J18.9

## 2015-11-19 LAB — GLUCOSE, CAPILLARY: Glucose-Capillary: 85 mg/dL (ref 65–99)

## 2015-11-19 LAB — CBC WITH DIFFERENTIAL/PLATELET
BASOS ABS: 0 10*3/uL (ref 0–0.1)
Basophils Relative: 0 %
EOS ABS: 0 10*3/uL (ref 0–0.7)
Eosinophils Relative: 0 %
HCT: 26.6 % — ABNORMAL LOW (ref 35.0–47.0)
Hemoglobin: 8.6 g/dL — ABNORMAL LOW (ref 12.0–16.0)
LYMPHS ABS: 0.7 10*3/uL — AB (ref 1.0–3.6)
Lymphocytes Relative: 3 %
MCH: 29.7 pg (ref 26.0–34.0)
MCHC: 32.5 g/dL (ref 32.0–36.0)
MCV: 91.5 fL (ref 80.0–100.0)
MONO ABS: 0.4 10*3/uL (ref 0.2–0.9)
MONOS PCT: 2 %
Neutro Abs: 20.8 10*3/uL — ABNORMAL HIGH (ref 1.4–6.5)
Neutrophils Relative %: 95 %
PLATELETS: 334 10*3/uL (ref 150–440)
RBC: 2.9 MIL/uL — AB (ref 3.80–5.20)
RDW: 15.8 % — AB (ref 11.5–14.5)
WBC: 21.9 10*3/uL — AB (ref 3.6–11.0)

## 2015-11-19 LAB — MRSA PCR SCREENING: MRSA BY PCR: NEGATIVE

## 2015-11-19 LAB — URINALYSIS COMPLETE WITH MICROSCOPIC (ARMC ONLY)
Bacteria, UA: NONE SEEN
Bilirubin Urine: NEGATIVE
GLUCOSE, UA: NEGATIVE mg/dL
Hgb urine dipstick: NEGATIVE
KETONES UR: NEGATIVE mg/dL
Leukocytes, UA: NEGATIVE
NITRITE: NEGATIVE
Protein, ur: NEGATIVE mg/dL
SPECIFIC GRAVITY, URINE: 1.01 (ref 1.005–1.030)
Squamous Epithelial / LPF: NONE SEEN
pH: 7 (ref 5.0–8.0)

## 2015-11-19 LAB — COMPREHENSIVE METABOLIC PANEL
ALK PHOS: 95 U/L (ref 38–126)
ALT: 9 U/L — ABNORMAL LOW (ref 14–54)
AST: 23 U/L (ref 15–41)
Albumin: 2.7 g/dL — ABNORMAL LOW (ref 3.5–5.0)
Anion gap: 5 (ref 5–15)
BILIRUBIN TOTAL: 0.5 mg/dL (ref 0.3–1.2)
BUN: 18 mg/dL (ref 6–20)
CALCIUM: 8.1 mg/dL — AB (ref 8.9–10.3)
CO2: 30 mmol/L (ref 22–32)
Chloride: 103 mmol/L (ref 101–111)
Creatinine, Ser: 1.28 mg/dL — ABNORMAL HIGH (ref 0.44–1.00)
GFR calc Af Amer: 49 mL/min — ABNORMAL LOW (ref >60)
GFR, EST NON AFRICAN AMERICAN: 43 mL/min — AB (ref >60)
Glucose, Bld: 95 mg/dL (ref 65–99)
POTASSIUM: 4 mmol/L (ref 3.5–5.1)
Sodium: 138 mmol/L (ref 135–145)
TOTAL PROTEIN: 5.5 g/dL — AB (ref 6.5–8.1)

## 2015-11-19 LAB — LACTIC ACID, PLASMA
LACTIC ACID, VENOUS: 1.4 mmol/L (ref 0.5–2.0)
LACTIC ACID, VENOUS: 3 mmol/L — AB (ref 0.5–2.0)

## 2015-11-19 LAB — TROPONIN I: Troponin I: <0.03 ng/mL (ref <0.031)

## 2015-11-19 LAB — STREP PNEUMONIAE URINARY ANTIGEN: Strep Pneumo Urinary Antigen: NEGATIVE

## 2015-11-19 MED ORDER — DEXTROSE 5 % IV SOLN
2.0000 g | Freq: Once | INTRAVENOUS | Status: AC
  Administered 2015-11-19: 2 g via INTRAVENOUS
  Filled 2015-11-19: qty 2

## 2015-11-19 MED ORDER — DIPHENOXYLATE-ATROPINE 2.5-0.025 MG PO TABS: 2.0000 | ORAL_TABLET | Freq: Four times a day (QID) | ORAL | Status: DC | PRN

## 2015-11-19 MED ORDER — IPRATROPIUM-ALBUTEROL 0.5-2.5 (3) MG/3ML IN SOLN
9.0000 mL | Freq: Once | RESPIRATORY_TRACT | Status: AC
  Administered 2015-11-19: 9 mL via RESPIRATORY_TRACT
  Filled 2015-11-19: qty 9

## 2015-11-19 MED ORDER — ACETAMINOPHEN 500 MG PO TABS: 1000.0000 mg | ORAL_TABLET | Freq: Four times a day (QID) | ORAL | Status: DC | PRN

## 2015-11-19 MED ORDER — TIOTROPIUM BROMIDE MONOHYDRATE 18 MCG IN CAPS
18.0000 ug | ORAL_CAPSULE | Freq: Every day | RESPIRATORY_TRACT | Status: DC
  Administered 2015-11-19 – 2015-11-24 (×6): 18 ug via RESPIRATORY_TRACT
  Filled 2015-11-19 (×2): qty 5

## 2015-11-19 MED ORDER — SODIUM CHLORIDE 0.9 % IV BOLUS (SEPSIS)
1000.0000 mL | Freq: Once | INTRAVENOUS | Status: AC
  Administered 2015-11-19: 1000 mL via INTRAVENOUS

## 2015-11-19 MED ORDER — DONEPEZIL HCL 5 MG PO TABS
10.0000 mg | ORAL_TABLET | Freq: Every day | ORAL | Status: DC
  Administered 2015-11-19 – 2015-11-23 (×5): 10 mg via ORAL
  Filled 2015-11-19 (×5): qty 2

## 2015-11-19 MED ORDER — VANCOMYCIN HCL 500 MG IV SOLR
500.0000 mg | INTRAVENOUS | Status: DC
  Administered 2015-11-20: 500 mg via INTRAVENOUS
  Filled 2015-11-19 (×2): qty 500

## 2015-11-19 MED ORDER — DEXTROSE 5 % IV SOLN
2.0000 g | Freq: Two times a day (BID) | INTRAVENOUS | Status: DC
  Administered 2015-11-19 – 2015-11-22 (×6): 2 g via INTRAVENOUS
  Filled 2015-11-19 (×7): qty 2

## 2015-11-19 MED ORDER — METHYLPREDNISOLONE SODIUM SUCC 125 MG IJ SOLR
125.0000 mg | Freq: Once | INTRAMUSCULAR | Status: AC
  Administered 2015-11-19: 125 mg via INTRAVENOUS
  Filled 2015-11-19: qty 2

## 2015-11-19 MED ORDER — GUAIFENESIN-DM 100-10 MG/5ML PO SYRP: 5.0000 mL | ORAL_SOLUTION | ORAL | Status: DC | PRN

## 2015-11-19 MED ORDER — MOMETASONE FURO-FORMOTEROL FUM 100-5 MCG/ACT IN AERO
2.0000 | INHALATION_SPRAY | Freq: Two times a day (BID) | RESPIRATORY_TRACT | Status: DC
  Administered 2015-11-19 – 2015-11-24 (×11): 2 via RESPIRATORY_TRACT
  Filled 2015-11-19: qty 8.8

## 2015-11-19 MED ORDER — SIMVASTATIN 20 MG PO TABS
10.0000 mg | ORAL_TABLET | Freq: Every day | ORAL | Status: DC
  Administered 2015-11-19 – 2015-11-23 (×5): 10 mg via ORAL
  Filled 2015-11-19 (×4): qty 1
  Filled 2015-11-19: qty 0.5
  Filled 2015-11-19: qty 1

## 2015-11-19 MED ORDER — SUMATRIPTAN SUCCINATE 50 MG PO TABS
25.0000 mg | ORAL_TABLET | ORAL | Status: DC | PRN
  Filled 2015-11-19: qty 1

## 2015-11-19 MED ORDER — PANTOPRAZOLE SODIUM 40 MG PO TBEC
40.0000 mg | DELAYED_RELEASE_TABLET | Freq: Every day | ORAL | Status: DC
  Administered 2015-11-19 – 2015-11-24 (×6): 40 mg via ORAL
  Filled 2015-11-19 (×6): qty 1

## 2015-11-19 MED ORDER — CLOPIDOGREL BISULFATE 75 MG PO TABS
75.0000 mg | ORAL_TABLET | Freq: Every day | ORAL | Status: DC
  Administered 2015-11-19 – 2015-11-24 (×6): 75 mg via ORAL
  Filled 2015-11-19 (×6): qty 1

## 2015-11-19 MED ORDER — SODIUM CHLORIDE 0.9 % IV SOLN
INTRAVENOUS | Status: DC
  Administered 2015-11-19 – 2015-11-20 (×3): via INTRAVENOUS

## 2015-11-19 MED ORDER — VANCOMYCIN HCL 500 MG IV SOLR
500.0000 mg | INTRAVENOUS | Status: DC
Start: 2015-11-19 — End: 2015-11-19
  Filled 2015-11-19: qty 500

## 2015-11-19 MED ORDER — ALBUTEROL SULFATE (2.5 MG/3ML) 0.083% IN NEBU
2.5000 mg | INHALATION_SOLUTION | RESPIRATORY_TRACT | Status: DC | PRN
  Administered 2015-11-21: 2.5 mg via RESPIRATORY_TRACT
  Filled 2015-11-19: qty 3

## 2015-11-19 MED ORDER — BENZONATATE 100 MG PO CAPS: 200.0000 mg | ORAL_CAPSULE | Freq: Three times a day (TID) | ORAL | Status: DC | PRN

## 2015-11-19 MED ORDER — DEXTROSE 5 % IV SOLN
1.0000 g | Freq: Three times a day (TID) | INTRAVENOUS | Status: DC
  Filled 2015-11-19 (×2): qty 1

## 2015-11-19 MED ORDER — VANCOMYCIN HCL IN DEXTROSE 1-5 GM/200ML-% IV SOLN
1000.0000 mg | Freq: Once | INTRAVENOUS | Status: AC
  Administered 2015-11-19: 1000 mg via INTRAVENOUS
  Filled 2015-11-19: qty 200

## 2015-11-19 MED ORDER — PREGABALIN 75 MG PO CAPS
150.0000 mg | ORAL_CAPSULE | Freq: Two times a day (BID) | ORAL | Status: DC
  Administered 2015-11-19 – 2015-11-24 (×11): 150 mg via ORAL
  Filled 2015-11-19 (×11): qty 2

## 2015-11-19 MED ORDER — SODIUM CHLORIDE 0.9 % IV BOLUS (SEPSIS)
500.0000 mL | INTRAVENOUS | Status: AC
  Administered 2015-11-19: 500 mL via INTRAVENOUS

## 2015-11-19 MED ORDER — VANCOMYCIN HCL 500 MG IV SOLR: 500.0000 mg | INTRAVENOUS | Status: DC

## 2015-11-19 MED ORDER — SODIUM CHLORIDE 0.9 % IV BOLUS (SEPSIS)
500.0000 mL | Freq: Once | INTRAVENOUS | Status: AC
  Administered 2015-11-19: 500 mL via INTRAVENOUS

## 2015-11-19 MED ORDER — ENOXAPARIN SODIUM 40 MG/0.4ML ~~LOC~~ SOLN
40.0000 mg | SUBCUTANEOUS | Status: DC
  Administered 2015-11-19 – 2015-11-23 (×5): 40 mg via SUBCUTANEOUS
  Filled 2015-11-19 (×5): qty 0.4

## 2015-11-19 MED ORDER — ALPRAZOLAM 0.5 MG PO TABS
0.5000 mg | ORAL_TABLET | ORAL | Status: DC | PRN
  Administered 2015-11-23: 23:00:00 0.5 mg via ORAL
  Filled 2015-11-19: qty 1

## 2015-11-19 NOTE — H&P (Signed)
[facility] [place] [facility]   PATIENT NAME: Cheryl DimmerMary [name]    MR#:  [id number]  DATE OF BIRTH:  08-30-1951  DATE OF ADMISSION:  11/19/2015  PRIMARY CARE PHYSICIAN: Mila Merryonald Fisher, MD   REQUESTING/REFERRING PHYSICIAN: Myrna Blazeravid Matthew Schaevitz, MD  CHIEF COMPLAINT:   Chief Complaint  Patient presents with  . Code Sepsis    HISTORY OF PRESENT ILLNESS:  Cheryl DimmerMary [name]  is a 64 y.o. female with a known history of recent pneumonia, COPD on oxygen 2 L at home when necessary, hypertension and CAD. The patient was sent to ED from nursing home due to fever 102, shortness of breath and hypoxia. She was found hypotensive and hypoxia in the ED. She was put on BiPAP and given normal saline bolus. Her chest x-ray show worsening right-sided pneumonia, so she was treated with antibiotics in the ED. The patient was just discharged recently after treatment of pneumonia, on Levaquin by mouth. She is not good historian, only complains of weakness.  PAST MEDICAL HISTORY:   Past Medical History  Diagnosis Date  . COPD (chronic obstructive pulmonary disease) (HCC)   . Hypertension   . Arthritis   . Hyperlipidemia   . GERD (gastroesophageal reflux disease)   . Neuropathy (HCC) 2010  . Oxygen deficiency   . Coronary artery disease   . Headache   . Anxiety   . Peripheral vascular disease (HCC)   . Vitamin D deficiency   . Migraines   . Chronic pain   . DVT (deep venous thrombosis) (HCC)   . Pneumonia   . Osteoporosis   . Allergy   . Asthma     PAST SURGICAL HISTORY:   Past Surgical History  Procedure Laterality Date  . Appendectomy    . Spine surgery    . Foot surgery Bilateral     5-6 years per patient  . Cardiac catherization  10/31/2009  . Abdomnal aortic stent  05/30/2008    Dr. Nanetta BattyJonathan Berry  . Abdominal hysterectomy  1975    Bilaterl Oophorectomy; Dur to IUD infection  . Cholecystectomy  1972  . Cervical fusion  C5 - 6/C6-7  . Appendectomy    .  Colonoscopy with propofol N/A 07/27/2015    Procedure: COLONOSCOPY WITH PROPOFOL;  Surgeon: Wallace CullensPaul Y Oh, MD;  [facility];  Service: Gastroenterology;  Laterality: N/A;  . Esophagogastroduodenoscopy (egd) with propofol N/A 07/27/2015    Procedure: ESOPHAGOGASTRODUODENOSCOPY (EGD) WITH PROPOFOL;  Surgeon: Wallace CullensPaul Y Oh, MD;  [facility];  Service: Gastroenterology;  Laterality: N/A;    SOCIAL HISTORY:   Social History  Substance Use Topics  . Smoking status: Current Every Day Smoker -- 1.00 packs/day for 50 years    Types: Cigarettes  . Smokeless tobacco: Not on file  . Alcohol Use: No    FAMILY HISTORY:   Family History  Problem Relation Age of Onset  . Cancer Mother   . Arthritis Mother   . Heart disease Mother   . Diabetes Mother     mellitus, type 2  . Heart disease Father   . Diabetes Sister   . Cancer Brother   . Cancer Brother     lung  . Diabetes Brother     DRUG ALLERGIES:   Allergies  Allergen Reactions  . Percocet [Oxycodone-Acetaminophen] Hives  . Aspirin Nausea And Vomiting and Other (See Comments)    Reaction:  GI upset   . Codeine Nausea And Vomiting and Other (See Comments)  Reaction:  GI upset   . Morphine Other (See Comments)    Reaction:  Unknown   . Pregabalin Other (See Comments)    Reaction:  Makes pt sleep excessively.    . Propoxyphene Other (See Comments)    Reaction:  GI upset   . Sulfa Antibiotics Rash and Other (See Comments)    Reaction:  GI upset     REVIEW OF SYSTEMS:  The patient looks confused and denies any symptoms except thirsty and weakness. unreliable.  MEDICATIONS AT HOME:   Prior to Admission medications   Medication Sig Start Date End Date Taking? Authorizing Provider  acetaminophen (TYLENOL) 500 MG tablet Take 1,000 mg by mouth every 6 (six) hours as needed for mild pain or headache.    Yes Historical Provider, MD  albuterol (PROVENTIL HFA;VENTOLIN HFA) 108 (90 BASE) MCG/ACT inhaler Inhale 2 puffs  into the lungs every 4 (four) hours as needed for wheezing or shortness of breath. 08/11/15  Yes Malva Limes, MD  albuterol (PROVENTIL) (2.5 MG/3ML) 0.083% nebulizer solution Take 3 mLs (2.5 mg total) by nebulization every 4 (four) hours as needed for wheezing. 10/12/15  Yes Katharina Caper, MD  ALPRAZolam Prudy Feeler) 0.5 MG tablet Take 0.5 mg by mouth every 4 (four) hours as needed for anxiety.    Yes Historical Provider, MD  benzonatate (TESSALON) 200 MG capsule Take 1 capsule (200 mg total) by mouth 3 (three) times daily as needed for cough. 10/22/15  Yes Delfino Lovett, MD  celecoxib (CELEBREX) 200 MG capsule Take 200 mg by mouth daily.   Yes Historical Provider, MD  clopidogrel (PLAVIX) 75 MG tablet Take 75 mg by mouth daily.   Yes Historical Provider, MD  diphenoxylate-atropine (LOMOTIL) 2.5-0.025 MG per tablet Take 2 tablets by mouth 4 (four) times daily as needed for diarrhea or loose stools.    Yes Historical Provider, MD  donepezil (ARICEPT) 10 MG tablet Take 10 mg by mouth at bedtime.   Yes Historical Provider, MD  estropipate (OGEN) 0.75 MG tablet Take 1 tablet (0.75 mg total) by mouth daily. 10/23/15  Yes Malva Limes, MD  fentaNYL (DURAGESIC - DOSED MCG/HR) 50 MCG/HR Place 1 patch (50 mcg total) onto the skin every 3 (three) days. 09/04/15  Yes Delano Metz, MD  Fluticasone-Salmeterol (ADVAIR) 250-50 MCG/DOSE AEPB Inhale 1 puff into the lungs 2 (two) times daily.   Yes Historical Provider, MD  furosemide (LASIX) 40 MG tablet Take 40 mg by mouth daily.   Yes Historical Provider, MD  guaiFENesin-dextromethorphan (ROBITUSSIN DM) 100-10 MG/5ML syrup Take 5 mLs by mouth every 4 (four) hours as needed for cough. 10/22/15  Yes Vipul Sherryll Burger, MD  levofloxacin (LEVAQUIN) 750 MG tablet Take 1 tablet (750 mg total) by mouth daily. 11/11/15  Yes Enedina Finner, MD  pantoprazole (PROTONIX) 40 MG tablet Take 40 mg by mouth daily.    Yes Historical Provider, MD  pregabalin (LYRICA) 150 MG capsule Take 1  capsule (150 mg total) by mouth 2 (two) times daily. 09/04/15  Yes Delano Metz, MD  simvastatin (ZOCOR) 10 MG tablet Take 10 mg by mouth at bedtime.    Yes Historical Provider, MD  SUMAtriptan (IMITREX) 25 MG tablet Take 25 mg by mouth as needed for migraine. May repeat in 2 hours if headache persists or recurs.   Yes Historical Provider, MD  tiotropium (SPIRIVA) 18 MCG inhalation capsule Place 1 capsule (18 mcg total) into inhaler and inhale daily. 10/25/15  Yes Malva Limes, MD  traMADol Janean Sark)  50 MG tablet Take 100 mg by mouth 4 (four) times daily.   Yes Historical Provider, MD      VITAL SIGNS:  Blood pressure 92/47, pulse 94, temperature 98.8 F (37.1 C), temperature source Core (Comment), resp. rate 16, SpO2 86 %.  PHYSICAL EXAMINATION:  GENERAL:  64 y.o.-year-old patient lying in the bed with BiPAP.  EYES: Pupils equal, round, reactive to light and accommodation. No scleral icterus. Extraocular muscles intact.  HEENT: Head atraumatic, normocephalic. Dry oral mucosa. NECK:  Supple, no jugular venous distention. No thyroid enlargement, no tenderness.  LUNGS: Normal breath sounds bilaterally, no wheezing, rales, but has rhonchi. No use of accessory muscles of respiration.  CARDIOVASCULAR: S1, S2 normal. No murmurs, rubs, or gallops.  ABDOMEN: Soft, nontender, nondistended. Bowel sounds present. No organomegaly or mass.  EXTREMITIES: No pedal edema, cyanosis, or clubbing.  NEUROLOGIC: Cranial nerves II through XII are intact. Muscle strength 3/5 in all extremities. Sensation intact. Gait not checked.  PSYCHIATRIC: The patient is alert and oriented x 3, but looks confused.  SKIN: No obvious rash, lesion, or ulcer.   LABORATORY PANEL:   CBC  Recent Labs Lab 11/19/15 0853  WBC 21.9*  HGB 8.6*  HCT 26.6*  PLT 334   ------------------------------------------------------------------------------------------------------------------  Chemistries   Recent Labs Lab  11/19/15 0853  NA 138  K 4.0  CL 103  CO2 30  GLUCOSE 95  BUN 18  CREATININE 1.28*  CALCIUM 8.1*  AST 23  ALT 9*  ALKPHOS 95  BILITOT 0.5   ------------------------------------------------------------------------------------------------------------------  Cardiac Enzymes  Recent Labs Lab 11/19/15 0853  TROPONINI <0.03   ------------------------------------------------------------------------------------------------------------------  RADIOLOGY:  Dg Chest Port 1 View  11/19/2015  CLINICAL DATA:  Hypotension. Patient is currently being treated for pneumonia. EXAM: PORTABLE CHEST 1 VIEW COMPARISON:  November 09, 2015 FINDINGS: The heart size and mediastinal contours are stable. The heart size is enlarged. There is consolidation of the right mid and lung base worse compared to prior exam. There is a small right pleural effusion. There is interval developed mild patchy opacity of medial left lung base. The visualized skeletal structures are stable. IMPRESSION: Worsened pneumonia of the right mid and lung base with associated pleural effusion compared to prior chest x-ray of November 09, 2015. Interval developed mild patchy opacity of medial left lung base probably at least in part due to atelectasis but superimposed pneumonia is not excluded. Electronically Signed   By: Sherian Rein M.D.   On: 11/19/2015 09:37    EKG:   Orders placed or performed during the hospital encounter of 11/19/15  . ED EKG 12-Lead  . ED EKG 12-Lead    IMPRESSION AND PLAN:   Acute respiratory failure with hypoxia Admit to stepdown unit, Continue BiPAP, Xopenex when necessary, continue Spiriva and dulera, , pulmonary consult.   Septic shock. The patient was treated with normal saline bolus 2 L in the ED, I will continue normal saline IV and Levophed when necessary. Continue antibiotics. Follow-up CBC, sputum culture and blood culture. Hold home Lasix.  Pneumonia HAP. She was treated with antibiotics  in the ED.I will continue cefepime and vancomycin, follow-up CBC, blood culture and sputum culture.  COPD. continue nebulizer treatment.  Anemia of chronic disease.  stable.   All the records reviewed and case discussed with ED provider. Management plans discussed with the patient, her husband and the son and they are in agreement.  CODE STATUS: Full code  TOTAL CRITICAL TIME TAKING CARE OF THIS PATIENT: 66 minutes.  Shaune Pollack M.D on 11/19/2015 at 11:28 AM  Between 7am to 6pm - Pager - [phone]  After 6pm go to [email or web address] - password [email or web address] [facility]  [facility]  Office  [phone]  CC: Primary care physician; Mila Merry, MD

## 2015-11-19 NOTE — ED Notes (Signed)
64 y[facility] presents via EMS with c/c hypotension, fever. Currently treated for pneumonia with levaquin. EMS found pt alert, responsive, hypotensive SBP 80, Spo2 70s on room air. EMS started 2 IVs, 500 ml bolus.

## 2015-11-19 NOTE — ED Provider Notes (Signed)
[facility] Emergency Department Provider Note  ____________________________________________  Time seen: Seen upon arrival to the emergency department  I have reviewed the triage vital signs and the nursing notes.   HISTORY  Chief Complaint Code Sepsis    HPI Cailyn Houdek is a 64 y.o. female with a history of COPD and recent hospitalization for pneumonia who is presenting with shortness of breath, fever and hypoxia. Prior to arrival the medics found her to have hypotension as well as hypoxia. She was started on a nonrebreather mask and the oxygen was elevated to the low 90s. She was fully awake and oriented for EMS. Patient denying any pain at this time. Does report shortness of breath. Has been taking Levaquin as an outpatient. Unclear exactly when symptoms began to worsen again.   Past Medical History  Diagnosis Date  . COPD (chronic obstructive pulmonary disease) (HCC)   . Hypertension   . Arthritis   . Hyperlipidemia   . GERD (gastroesophageal reflux disease)   . Neuropathy (HCC) 2010  . Oxygen deficiency   . Coronary artery disease   . Headache   . Anxiety   . Peripheral vascular disease (HCC)   . Vitamin D deficiency   . Migraines   . Chronic pain   . DVT (deep venous thrombosis) (HCC)   . Pneumonia   . Osteoporosis   . Allergy   . Asthma     Patient Active Problem List   Diagnosis Date Noted  . Malnutrition of moderate degree 11/10/2015  . Sepsis (HCC) 10/17/2015  . Abnormal mammogram of right breast 10/11/2015  . Dilated intrahepatic bile duct 10/11/2015  . Anemia 10/11/2015  . Lumbar radicular pain 09/04/2015  . Chronic low back pain 09/04/2015  . Other long term (current) drug therapy 09/04/2015  . Encounter for long-term opiate analgesic use 09/04/2015  . Encounter for therapeutic drug level monitoring 09/04/2015  . Opiate use 09/04/2015  . Uncomplicated opioid dependence (HCC) 09/04/2015  . Chronic pain syndrome  09/04/2015  . Platelet inhibition due to Plavix 09/04/2015  . COPD (chronic obstructive pulmonary disease) (HCC) 07/31/2015  . Dementia 07/31/2015  . Gastrointestinal complaints, nonspecific 07/31/2015  . Absolute anemia 07/03/2015  . Airway hyperreactivity 07/03/2015  . Back pain, thoracic 07/03/2015  . Diaphoresis 07/03/2015  . Excessive falling 07/03/2015  . Alteration in bowel elimination: incontinence 07/03/2015  . Insomnia 07/03/2015  . Decreased testosterone level 07/03/2015  . Leg weakness 07/03/2015  . Menopausal symptom 07/03/2015  . Migraine 07/03/2015  . Neuropathy (HCC) 07/03/2015  . Fecal occult blood test positive 07/03/2015  . OP (osteoporosis) 07/03/2015  . Episodic paroxysmal anxiety disorder 07/03/2015  . Compulsive tobacco user syndrome 07/03/2015  . Urinary incontinence 07/03/2015  . Weight loss 07/03/2015  . Essential hypertension 06/06/2015  . GERD (gastroesophageal reflux disease) 06/06/2015  . Hyperlipemia 06/06/2015  . Disorder of peripheral nervous system (HCC) 04/13/2014  . 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 Anxiety 02/10/2014  . Coronary artery disease 02/10/2014  . Peripheral blood vessel disorder (HCC) 02/10/2014  . Hypercholesteremia 02/10/2014  . Peripheral vascular disease (HCC) 02/10/2014  . Vitamin D deficiency 10/16/2009    Past Surgical History  Procedure Laterality Date  . Appendectomy    . Spine surgery    . Foot surgery Bilateral     5-6 years per patient  . Cardiac catherization  10/31/2009  . Abdomnal aortic stent  05/30/2008    Dr. Nanetta Batty  . Abdominal hysterectomy  1975  Bilaterl Oophorectomy; Dur to IUD infection  . Cholecystectomy  1972  . Cervical fusion  C5 - 6/C6-7  . Appendectomy    . Colonoscopy with propofol N/A 07/27/2015    Procedure: COLONOSCOPY WITH PROPOFOL;  Surgeon: Wallace CullensPaul Y Oh, MD;  [facility];  Service: Gastroenterology;  Laterality: N/A;  . Esophagogastroduodenoscopy (egd)  with propofol N/A 07/27/2015    Procedure: ESOPHAGOGASTRODUODENOSCOPY (EGD) WITH PROPOFOL;  Surgeon: Wallace CullensPaul Y Oh, MD;  [facility];  Service: Gastroenterology;  Laterality: N/A;    Current Outpatient Rx  Name  Route  Sig  Dispense  Refill  . acetaminophen (TYLENOL) 500 MG tablet   Oral   Take 1,000 mg by mouth every 6 (six) hours as needed for mild pain or headache.          . albuterol (PROVENTIL HFA;VENTOLIN HFA) 108 (90 BASE) MCG/ACT inhaler   Inhalation   Inhale 2 puffs into the lungs every 4 (four) hours as needed for wheezing or shortness of breath.   18 g   3   . albuterol (PROVENTIL) (2.5 MG/3ML) 0.083% nebulizer solution   Nebulization   Take 3 mLs (2.5 mg total) by nebulization every 4 (four) hours as needed for wheezing.   75 mL   12   . ALPRAZolam (XANAX) 0.5 MG tablet   Oral   Take 0.5 mg by mouth every 4 (four) hours as needed for anxiety.          . benzonatate (TESSALON) 200 MG capsule   Oral   Take 1 capsule (200 mg total) by mouth 3 (three) times daily as needed for cough.   20 capsule   0   . celecoxib (CELEBREX) 200 MG capsule   Oral   Take 200 mg by mouth daily.         . clopidogrel (PLAVIX) 75 MG tablet   Oral   Take 75 mg by mouth daily.         . diphenoxylate-atropine (LOMOTIL) 2.5-0.025 MG per tablet   Oral   Take 2 tablets by mouth 4 (four) times daily as needed for diarrhea or loose stools.          . donepezil (ARICEPT) 10 MG tablet   Oral   Take 10 mg by mouth at bedtime.         Marland Kitchen. estropipate (OGEN) 0.75 MG tablet   Oral   Take 1 tablet (0.75 mg total) by mouth daily.   30 tablet   6   . fentaNYL (DURAGESIC - DOSED MCG/HR) 50 MCG/HR   Transdermal   Place 1 patch (50 mcg total) onto the skin every 3 (three) days.   10 patch   0     Do not place this medication, or any other prescri ...   . Fluticasone-Salmeterol (ADVAIR) 250-50 MCG/DOSE AEPB   Inhalation   Inhale 1 puff into the lungs 2 (two) times  daily.         . furosemide (LASIX) 40 MG tablet   Oral   Take 40 mg by mouth daily.         Marland Kitchen. guaiFENesin-dextromethorphan (ROBITUSSIN DM) 100-10 MG/5ML syrup   Oral   Take 5 mLs by mouth every 4 (four) hours as needed for cough.   118 mL   0   . levofloxacin (LEVAQUIN) 750 MG tablet   Oral   Take 1 tablet (750 mg total) by mouth daily.   5 tablet   0   . mupirocin  cream (BACTROBAN) 2 %   Topical   Apply 1 application topically 2 (two) times daily.          . pantoprazole (PROTONIX) 40 MG tablet   Oral   Take 40 mg by mouth daily.          . pregabalin (LYRICA) 150 MG capsule   Oral   Take 1 capsule (150 mg total) by mouth 2 (two) times daily.   60 capsule   2     Do not place this medication, or any other prescri ...   . silver sulfADIAZINE (SILVADENE) 1 % cream   Topical   Apply 1 application topically daily.          . simvastatin (ZOCOR) 10 MG tablet   Oral   Take 10 mg by mouth at bedtime.          . SUMAtriptan (IMITREX) 25 MG tablet   Oral   Take 25 mg by mouth as needed for migraine. May repeat in 2 hours if headache persists or recurs.         Marland Kitchen tiotropium (SPIRIVA) 18 MCG inhalation capsule   Inhalation   Place 1 capsule (18 mcg total) into inhaler and inhale daily.   30 capsule   12   . traMADol (ULTRAM) 50 MG tablet   Oral   Take 100 mg by mouth 4 (four) times daily.           Allergies Percocet; Aspirin; Codeine; Morphine; Pregabalin; Propoxyphene; and Sulfa antibiotics  Family History  Problem Relation Age of Onset  . Cancer Mother   . Arthritis Mother   . Heart disease Mother   . Diabetes Mother     mellitus, type 2  . Heart disease Father   . Diabetes Sister   . Cancer Brother   . Cancer Brother     lung  . Diabetes Brother     Social History Social History  Substance Use Topics  . Smoking status: Current Every Day Smoker -- 1.00 packs/day for 50 years    Types: Cigarettes  . Smokeless tobacco: Not on  file  . Alcohol Use: No    Review of Systems Constitutional: Positive for fever  Eyes: No visual changes. ENT: No sore throat. Cardiovascular: Denies chest pain. Respiratory: As above  Gastrointestinal: No abdominal pain.  No nausea, no vomiting.  No diarrhea.  No constipation. Genitourinary: Negative for dysuria. Musculoskeletal: Negative for back pain. Skin: Negative for rash. Neurological: Negative for headaches, focal weakness or numbness.  10-point ROS otherwise negative.  ____________________________________________   PHYSICAL EXAM:  VITAL SIGNS: ED Triage Vitals  Enc Vitals Group     BP 11/19/15 0847 107/51 mmHg     Pulse Rate 11/19/15 0847 121     Resp 11/19/15 0847 18     Temp 11/19/15 0847 99.9 F (37.7 C)     Temp Source 11/19/15 0847 Oral     SpO2 11/19/15 0847 69 %     Weight --      Height --      Head Cir --      Peak Flow --      Pain Score --      Pain Loc --      Pain Edu? --      Excl. in GC? --     Constitutional: Alert and oriented. Ill appearing and slightly slow to respond.  Eyes: Conjunctivae are normal. PERRL. EOMI. Head: Atraumatic. Nose: No congestion/rhinnorhea. Mouth/Throat: Mucous  membranes are moist.  Oropharynx non-erythematous. Neck: No stridor.   Cardiovascular: Tachycardic, regular rhythm. Grossly normal heart sounds.  Good peripheral circulation. Respiratory: Normal respiratory effort but with rhonchi to the bilateral fields from the bases to the mid fields. Gastrointestinal: Soft and nontender. No distention.  No CVA tenderness. Musculoskeletal: No lower extremity tenderness nor edema.  No joint effusions. Neurologic:  Normal speech and language. No gross focal neurologic deficits are appreciated.  Skin:  Skin is warm, dry and intact. No rash noted.   ____________________________________________   LABS (all labs ordered are listed, but only abnormal results are displayed)  Labs Reviewed  COMPREHENSIVE METABOLIC PANEL -  Abnormal; Notable for the following:    Creatinine, Ser 1.28 (*)    Calcium 8.1 (*)    Total Protein 5.5 (*)    Albumin 2.7 (*)    ALT 9 (*)    GFR calc non Af Amer 43 (*)    GFR calc Af Amer 49 (*)    All other components within normal limits  CBC WITH DIFFERENTIAL/PLATELET - Abnormal; Notable for the following:    WBC 21.9 (*)    RBC 2.90 (*)    Hemoglobin 8.6 (*)    HCT 26.6 (*)    RDW 15.8 (*)    All other components within normal limits  URINALYSIS COMPLETEWITH MICROSCOPIC ([facility] ONLY) - Abnormal; Notable for the following:    Color, Urine STRAW (*)    APPearance CLEAR (*)    All other components within normal limits  CULTURE, BLOOD (ROUTINE X 2)  CULTURE, BLOOD (ROUTINE X 2)  URINE CULTURE  LACTIC ACID, PLASMA  GLUCOSE, CAPILLARY  LACTIC ACID, PLASMA  TROPONIN I   ____________________________________________  EKG  ED ECG REPORT I, Arelia Longest, the attending physician, personally viewed and interpreted this ECG.   Date: 11/19/2015  EKG Time: 857  Rate: 114  Rhythm: sinus tachycardia  Axis: Normal axis  Intervals:Prolonged QT interval  ST&T Change: No ST segment elevation or depression. No abnormal T-wave inversion.  ____________________________________________  RADIOLOGY  Interpreted by me. Bilateral infiltrate consistent with pneumonia.  ____________________________________________   PROCEDURES  CRITICAL CARE Performed by: Arelia Longest   Total critical care time: 35 minutes  Critical care time was exclusive of separately billable procedures and treating other patients.  Critical care was necessary to treat or prevent imminent or life-threatening deterioration.  Critical care was time spent personally by me on the following activities: development of treatment plan with patient and/or surrogate as well as nursing, discussions with consultants, evaluation of patient's response to treatment, examination of patient, obtaining history  from patient or surrogate, ordering and performing treatments and interventions, ordering and review of laboratory studies, ordering and review of radiographic studies, pulse oximetry and re-evaluation of patient's condition.  ____________________________________________   INITIAL IMPRESSION / ASSESSMENT AND PLAN / ED COURSE  Pertinent labs & imaging results that were available during my care of the patient were reviewed by me and considered in my medical decision making (see chart for details).  ----------------------------------------- 10:20 AM on 11/19/2015 -----------------------------------------  Patient with improved mentation at this time and tolerating BiPAP well. Husband is now the bedside and said that she may have aspirated popcorn 1 month ago and since then has been having recurrent pneumonia. I discussed this with Dr. Cherlynn Kaiser about a possible bronc. Dr. Cherlynn Kaiser also suggested a speech and swallow evaluation.  Patient now satting 100% on her BiPAP. Explained the plan to the patient and  the family and they're understanding and wanted to comply. ____________________________________________   FINAL CLINICAL IMPRESSION(S) / ED DIAGNOSES  HCAP. Hypoxia. Sepsis.    Myrna Blazer, MD 11/19/15 1021

## 2015-11-19 NOTE — Progress Notes (Signed)
   11/19/15 1400  Clinical Encounter Type  Visited With Patient and family together  Visit Type Initial;Spiritual support  Referral From Nurse  Consult/Referral To Chaplain  Spiritual Encounters  Spiritual Needs Literature;Brochure  Stress Factors  Patient Stress Factors Not reviewed  Chaplain received an order and responded to a request for AD. Offered a packet of information and asked patient to follow up as no notary was present today. Chaplain Buffey Zabinski A. Ronnel Zuercher Ext. [phone]

## 2015-11-19 NOTE — Progress Notes (Signed)
Spoke with Dr. Imogene Burnhen informed him of positive lactic acid of 3.0. No orders received.

## 2015-11-19 NOTE — Progress Notes (Signed)
Received report from ED Lorraine LaxFelisha Staropli, RN

## 2015-11-19 NOTE — Progress Notes (Addendum)
ANTIBIOTIC CONSULT NOTE - INITIAL  Pharmacy Consult for Cefepime/Vancocmycin Indication: pneumonia  Allergies  Allergen Reactions  . Percocet [Oxycodone-Acetaminophen] Hives  . Aspirin Nausea And Vomiting and Other (See Comments)    Reaction:  GI upset   . Codeine Nausea And Vomiting and Other (See Comments)    Reaction:  GI upset   . Morphine Other (See Comments)    Reaction:  Unknown   . Pregabalin Other (See Comments)    Reaction:  Makes pt sleep excessively.    . Propoxyphene Other (See Comments)    Reaction:  GI upset   . Sulfa Antibiotics Rash and Other (See Comments)    Reaction:  GI upset     Patient Measurements:   Height: 62 inches Weight: 47.4 kg  Vital Signs: Temp: 99.2 F (37.3 C) (12/25 1000) Temp Source: Oral (12/25 0847) BP: 92/47 mmHg (12/25 1000) Pulse Rate: 94 (12/25 1000) Intake/Output from previous day:   Intake/Output from this shift:    Labs:  Recent Labs  11/19/15 0853  WBC 21.9*  HGB 8.6*  PLT 334  CREATININE 1.28*   Estimated Creatinine Clearance: 32.4 mL/min (by C-G formula based on Cr of 1.28). No results for input(s): VANCOTROUGH, VANCOPEAK, VANCORANDOM, GENTTROUGH, GENTPEAK, GENTRANDOM, TOBRATROUGH, TOBRAPEAK, TOBRARND, AMIKACINPEAK, AMIKACINTROU, AMIKACIN in the last 72 hours.   Microbiology: Recent Results (from the past 720 hour(s))  Blood Culture (routine x 2)     Status: None   Collection Time: 11/09/15  8:25 PM  Result Value Ref Range Status   Specimen Description BLOOD RIGHT ANTECUBITAL  Final   Special Requests BOTTLES DRAWN AEROBIC AND ANAEROBIC 10ML  Final   Culture NO GROWTH 5 DAYS  Final   Report Status 11/14/2015 FINAL  Final  Blood Culture (routine x 2)     Status: None   Collection Time: 11/09/15  8:30 PM  Result Value Ref Range Status   Specimen Description BLOOD LEFT HAND  Final   Special Requests BOTTLES DRAWN AEROBIC AND ANAEROBIC 10ML  Final   Culture NO GROWTH 5 DAYS  Final   Report Status 11/14/2015  FINAL  Final  Urine culture     Status: None   Collection Time: 11/09/15  8:40 PM  Result Value Ref Range Status   Specimen Description URINE, RANDOM  Final   Special Requests NONE  Final   Culture NO GROWTH 2 DAYS  Final   Report Status 11/12/2015 FINAL  Final  MRSA PCR Screening     Status: None   Collection Time: 11/10/15  2:20 AM  Result Value Ref Range Status   MRSA by PCR NEGATIVE NEGATIVE Final    Comment:        The GeneXpert MRSA Assay (FDA approved for NASAL specimens only), is one component of a comprehensive MRSA colonization surveillance program. It is not intended to diagnose MRSA infection nor to guide or monitor treatment for MRSA infections.     Medical History: Past Medical History  Diagnosis Date  . COPD (chronic obstructive pulmonary disease) (HCC)   . Hypertension   . Arthritis   . Hyperlipidemia   . GERD (gastroesophageal reflux disease)   . Neuropathy (HCC) 2010  . Oxygen deficiency   . Coronary artery disease   . Headache   . Anxiety   . Peripheral vascular disease (HCC)   . Vitamin D deficiency   . Migraines   . Chronic pain   . DVT (deep venous thrombosis) (HCC)   . Pneumonia   . Osteoporosis   .  Allergy   . Asthma     Medications:  Scheduled:   Infusions:  . ceFEPime (MAXIPIME) IV    . sodium chloride    . vancomycin    . vancomycin     Assessment: 64 y/o F with COPD and recent hospitialization for PNA.  Ke: 0.031 h-1, 33.2 L  Goal of Therapy:  Vancomycin trough level 15-20 mcg/ml  Plan:  Cefepime 2 g iv q 12 hours.   Vancomycin 1000 mg once given in ED. Will order vancomycin 500 mg iv q 18 hours and a trough with the 4th dose. Will continue to follow renal function and culture results.   Luisa Hart D 11/19/2015,10:55 AM

## 2015-11-19 NOTE — Plan of Care (Signed)
Problem: Respiratory: Goal: Ability to maintain adequate ventilation will improve Outcome: Progressing Pt off bipap. On 4L [place]

## 2015-11-20 LAB — CBC
HCT: 22.4 % — ABNORMAL LOW (ref 35.0–47.0)
HEMOGLOBIN: 7.3 g/dL — AB (ref 12.0–16.0)
MCH: 29.6 pg (ref 26.0–34.0)
MCHC: 32.5 g/dL (ref 32.0–36.0)
MCV: 90.9 fL (ref 80.0–100.0)
PLATELETS: 292 10*3/uL (ref 150–440)
RBC: 2.47 MIL/uL — ABNORMAL LOW (ref 3.80–5.20)
RDW: 15.8 % — AB (ref 11.5–14.5)
WBC: 36.5 10*3/uL — ABNORMAL HIGH (ref 3.6–11.0)

## 2015-11-20 LAB — URINE CULTURE: CULTURE: NO GROWTH

## 2015-11-20 LAB — LACTIC ACID, PLASMA: LACTIC ACID, VENOUS: 1.3 mmol/L (ref 0.5–2.0)

## 2015-11-20 MED ORDER — VANCOMYCIN HCL 500 MG IV SOLR
500.0000 mg | INTRAVENOUS | Status: DC
  Filled 2015-11-20: qty 500

## 2015-11-20 MED ORDER — FENTANYL 50 MCG/HR TD PT72
50.0000 ug | MEDICATED_PATCH | TRANSDERMAL | Status: DC
  Administered 2015-11-20 – 2015-11-23 (×2): 50 ug via TRANSDERMAL
  Filled 2015-11-20 (×2): qty 1

## 2015-11-20 MED ORDER — VANCOMYCIN HCL 500 MG IV SOLR
500.0000 mg | INTRAVENOUS | Status: DC
  Administered 2015-11-20: 500 mg via INTRAVENOUS
  Filled 2015-11-20: qty 500

## 2015-11-20 NOTE — Plan of Care (Signed)
Problem: Physical Regulation: Goal: Diagnostic test results will improve Outcome: Progressing Lactic Acid Improving. Goal: Signs and symptoms of infection will decrease Outcome: Progressing WBC's Increasing. Remains on IV Antibiotics.      Problem: Respiratory: Goal: Ability to maintain adequate ventilation will improve Outcome: Progressing VSS. O2 sat 97% on Room Air. Tolerating Well. Remains on Inhalers. Duo Nebs Q4hrs PRN.

## 2015-11-20 NOTE — Clinical Social Work Note (Signed)
Clinical Social Work Assessment  Patient Details  Name: Cheryl [name] MRN: [id number] Date of Birth: 24-Jul-1951  Date of referral:  11/20/15               Reason for consult:  Facility Placement                Permission sought to share information with:  Family Supports Permission granted to share information::  Yes, Verbal Permission Granted  Name::      (Husband)   Housing/Transportation Living arrangements for the past 2 months:  Single Family Home Source of Information:  Patient Patient Interpreter Needed:  None Criminal Activity/Legal Involvement Pertinent to Current Situation/Hospitalization:  No - Comment as needed Significant Relationships:  Spouse Lives with:  Spouse Do you feel safe going back to the place where you live?  Yes Need for family participation in patient care:  No (Coment)  Care giving concerns:  No care giving concerns identified.   Social Worker assessment / plan:  CSW met with pt to address consult. Pt was admitted from [facility]. Pt is known to CSW from previous admission. CSW explained process of returning to [facility]. Pt would like to return at discharge. PT is recommending SNF. Pt reported that her insurance is running out at the end of the year and her husband is addressing it. CSW will follow up with husband to address insurance.   CSW will contact [facility] regarding pt's return. CSW will also initiate [facility] auth. CSW will continue to follow.   Employment status:  Retired Nurse, adult PT Recommendations:  [place] / Referral to community resources:  [place]  Patient/Family's Response to care:  Pt was appreciative of CSW support.  Patient/Family's Understanding of and Emotional Response to Diagnosis, Current Treatment, and Prognosis:  Pt understands that pt needs a higher level of care prior to returning home.   Emotional Assessment Appearance:  Appears older than  stated age Attitude/Demeanor/Rapport:   (Appropriate) Affect (typically observed):  Accepting, Adaptable Orientation:  Oriented to Self, Oriented to Place, Oriented to  Time, Oriented to Situation Alcohol / Substance use:  Never Used Psych involvement (Current and /or in the community):  No (Comment)  Discharge Needs  Concerns to be addressed:  No discharge needs identified Readmission within the last 30 days:  Yes Current discharge risk:  None Barriers to Discharge:  No Barriers Identified   Darden Dates, LCSW 11/20/2015, 4:25 PM

## 2015-11-20 NOTE — Progress Notes (Signed)
ANTIBIOTIC CONSULT NOTE - Follow up  Pharmacy Consult for Cefepime/Vancocmycin Indication: pneumonia  Allergies  Allergen Reactions  . Percocet [Oxycodone-Acetaminophen] Hives  . Aspirin Nausea And Vomiting and Other (See Comments)    Reaction:  GI upset   . Codeine Nausea And Vomiting and Other (See Comments)    Reaction:  GI upset   . Morphine Other (See Comments)    Reaction:  Unknown   . Pregabalin Other (See Comments)    Reaction:  Makes pt sleep excessively.    . Propoxyphene Other (See Comments)    Reaction:  GI upset   . Sulfa Antibiotics Rash and Other (See Comments)    Reaction:  GI upset     Patient Measurements: Height:  (154.9 cm) Weight: 101 lb (45.813 kg) IBW/kg (Calculated) : 47.8 Height: 62 inches Weight:   Vital Signs: Temp: 98.6 F (37 C) (12/26 1334) Temp Source: Oral (12/26 1334) BP: 129/56 mmHg (12/26 1334) Pulse Rate: 95 (12/26 1334) Intake/Output from previous day: 12/25 0701 - 12/26 0700 In: 1695 [I.V.:1695] Out: 2510 [Urine:2510] Intake/Output from this shift: Total I/O In: 424.2 [I.V.:424.2] Out: -   Labs:  Recent Labs  11/19/15 0853 11/20/15 0255  WBC 21.9* 36.5*  HGB 8.6* 7.3*  PLT 334 292  CREATININE 1.28*  --    Estimated Creatinine Clearance: 31.3 mL/min (by C-G formula based on Cr of 1.28).  Microbiology: Recent Results (from the past 720 hour(s))  Blood Culture (routine x 2)     Status: None   Collection Time: 11/09/15  8:25 PM  Result Value Ref Range Status   Specimen Description BLOOD RIGHT ANTECUBITAL  Final   Special Requests BOTTLES DRAWN AEROBIC AND ANAEROBIC  Final   Culture NO GROWTH 5 DAYS  Final   Report Status 11/14/2015 FINAL  Final  Blood Culture (routine x 2)     Status: None   Collection Time: 11/09/15  8:30 PM  Result Value Ref Range Status   Specimen Description BLOOD LEFT HAND  Final   Special Requests BOTTLES DRAWN AEROBIC AND ANAEROBIC  Final   Culture NO GROWTH 5 DAYS  Final   Report Status 11/14/2015 FINAL  Final  Urine culture     Status: None   Collection Time: 11/09/15  8:40 PM  Result Value Ref Range Status   Specimen Description URINE, RANDOM  Final   Special Requests NONE  Final   Culture NO GROWTH 2 DAYS  Final   Report Status 11/12/2015 FINAL  Final  MRSA PCR Screening     Status: None   Collection Time: 11/10/15  2:20 AM  Result Value Ref Range Status   MRSA by PCR NEGATIVE NEGATIVE Final    Comment:        The GeneXpert MRSA Assay (FDA approved for NASAL specimens only), is one component of a comprehensive MRSA colonization surveillance program. It is not intended to diagnose MRSA infection nor to guide or monitor treatment for MRSA infections.   Blood Culture (routine x 2)     Status: None (Preliminary result)   Collection Time: 11/19/15  8:53 AM  Result Value Ref Range Status   Specimen Description BLOOD LEFT AC  Final   Special Requests BOTTLES DRAWN AEROBIC AND ANAEROBIC  Final   Culture NO GROWTH 1 DAY  Final   Report Status PENDING  Incomplete  Urine culture     Status: None   Collection Time: 11/19/15  8:53 AM  Result Value Ref Range Status  Specimen Description URINE, RANDOM  Final   Special Requests NONE  Final   Culture NO GROWTH 1 DAY  Final   Report Status 11/20/2015 FINAL  Final  Blood Culture (routine x 2)     Status: None (Preliminary result)   Collection Time: 11/19/15  9:01 AM  Result Value Ref Range Status   Specimen Description BLOOD RIGHT AC  Final   Special Requests   Final    BOTTLES DRAWN AEROBIC AND ANAEROBIC  AEROBIC 6ML ANAEROBIC 5ML   Culture NO GROWTH 1 DAY  Final   Report Status PENDING  Incomplete  MRSA PCR Screening     Status: None   Collection Time: 11/19/15 12:07 PM  Result Value Ref Range Status   MRSA by PCR NEGATIVE NEGATIVE Final    Comment:        The GeneXpert MRSA Assay (FDA approved for NASAL specimens only), is one component of a comprehensive MRSA colonization surveillance  program. It is not intended to diagnose MRSA infection nor to guide or monitor treatment for MRSA infections.     Medical History: Past Medical History  Diagnosis Date  . COPD (chronic obstructive pulmonary disease) (HCC)   . Hypertension   . Arthritis   . Hyperlipidemia   . GERD (gastroesophageal reflux disease)   . Neuropathy (HCC) 2010  . Oxygen deficiency   . Coronary artery disease   . Headache   . Anxiety   . Peripheral vascular disease (HCC)   . Vitamin D deficiency   . Migraines   . Chronic pain   . DVT (deep venous thrombosis) (HCC)   . Pneumonia   . Osteoporosis   . Allergy   . Asthma     Medications:  Scheduled:  . ceFEPime (MAXIPIME) IV  2 g Intravenous Q12H  . clopidogrel  75 mg Oral Daily  . donepezil  10 mg Oral QHS  . enoxaparin (LOVENOX) injection  40 mg Subcutaneous Q24H  . mometasone-formoterol  2 puff Inhalation BID  . pantoprazole  40 mg Oral Daily  . pregabalin  150 mg Oral BID  . simvastatin  10 mg Oral QHS  . tiotropium  18 mcg Inhalation Daily  . [START ON 11/21/2015] vancomycin (VANCOCIN) 500 mg IVPB  500 mg Intravenous Q24H   Infusions:    Assessment: 64 y/o F with COPD and recent hospitialization for PNA.  Ke: 0.031 h-1, 33.2 L  12/26: Cultures NGTD  Goal of Therapy:  Vancomycin trough level 15-20 mcg/ml  Plan:  Cefepime 2 g iv q 12 hours.   Vancomycin 1000 mg once given in ED. Will order vancomycin 500 mg iv q 18 hours and a trough with the 4th dose. Will continue to follow renal function and culture results.   12/26: Will continue current regimen- follow up Scr.  Bari MantisKristin Traci Plemons PharmD Clinical Pharmacist 11/20/2015 ,4:20 PM

## 2015-11-20 NOTE — Plan of Care (Signed)
Problem: Physical Regulation: Goal: Diagnostic test results will improve Outcome: Progressing Lactic Acid Improving. Goal: Signs and symptoms of infection will decrease Outcome: Progressing WBC's Increasing. Remains on IV Antibiotic's.

## 2015-11-20 NOTE — NC FL2 (Signed)
[place] MEDICAID FL2 LEVEL OF CARE SCREENING TOOL     IDENTIFICATION  Patient Name: Cheryl [name] Birthdate: 1951/02/11 Sex: female Admission Date (Current Location): 11/19/2015  [facility] [place]  [place]  ([id number] [facility]) Facility and Address:  [facility], [address], [place], [place] [address]      Provider Number: [id number]  Attending Physician Name and Address:  Shaune PollackQing Mekiah Wahler, MD  Relative Name and Phone Number:       Current Level of Care: Hospital Recommended Level of Care: Skilled Nursing Facility Prior Approval Number:    Date Approved/Denied:   PASRR Number:  [id number] [id number])  Discharge Plan: SNF    Current Diagnoses: Patient Active Problem List   Diagnosis Date Noted  . Pneumonia 11/19/2015  . Malnutrition of moderate degree 11/10/2015  . Sepsis (HCC) 10/17/2015  . Abnormal mammogram of right breast 10/11/2015  . Dilated intrahepatic bile duct 10/11/2015  . Anemia 10/11/2015  . Lumbar radicular pain 09/04/2015  . Chronic low back pain 09/04/2015  . Other long term (current) drug therapy 09/04/2015  . Encounter for long-term opiate analgesic use 09/04/2015  . Encounter for therapeutic drug level monitoring 09/04/2015  . Opiate use 09/04/2015  . Uncomplicated opioid dependence (HCC) 09/04/2015  . Chronic pain syndrome 09/04/2015  . Platelet inhibition due to Plavix 09/04/2015  . COPD (chronic obstructive pulmonary disease) (HCC) 07/31/2015  . Dementia 07/31/2015  . Gastrointestinal complaints, nonspecific 07/31/2015  . Absolute anemia 07/03/2015  . Airway hyperreactivity 07/03/2015  . Back pain, thoracic 07/03/2015  . Diaphoresis 07/03/2015  . Excessive falling 07/03/2015  . Alteration in bowel elimination: incontinence 07/03/2015  . Insomnia 07/03/2015  . Decreased testosterone level 07/03/2015  . Leg weakness 07/03/2015  . Menopausal symptom 07/03/2015  . Migraine 07/03/2015  . Neuropathy (HCC) 07/03/2015   . Fecal occult blood test positive 07/03/2015  . OP (osteoporosis) 07/03/2015  . Episodic paroxysmal anxiety disorder 07/03/2015  . Compulsive tobacco user syndrome 07/03/2015  . Urinary incontinence 07/03/2015  . Weight loss 07/03/2015  . Essential hypertension 06/06/2015  . GERD (gastroesophageal reflux disease) 06/06/2015  . Hyperlipemia 06/06/2015  . Disorder of peripheral nervous system (HCC) 04/13/2014  . 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 Anxiety 02/10/2014  . Coronary artery disease 02/10/2014  . Peripheral blood vessel disorder (HCC) 02/10/2014  . Hypercholesteremia 02/10/2014  . Peripheral vascular disease (HCC) 02/10/2014  . Vitamin D deficiency 10/16/2009    Orientation RESPIRATION BLADDER Height & Weight    Self, Time, Situation, Place  Normal Continent 5\' 1"  (154.9 cm) 101 lbs.  BEHAVIORAL SYMPTOMS/MOOD NEUROLOGICAL BOWEL NUTRITION STATUS   (None)  (None) Continent Diet (Heart Healthy )  AMBULATORY STATUS COMMUNICATION OF NEEDS Skin   Extensive Assist Verbally Normal                       Personal Care Assistance Level of Assistance  Bathing, Feeding, Dressing Bathing Assistance: Limited assistance Feeding assistance: Independent Dressing Assistance: Limited assistance     Functional Limitations Info  Sight, Hearing, Speech Sight Info: Adequate Hearing Info: Adequate Speech Info: Adequate    SPECIAL CARE FACTORS FREQUENCY  PT (By licensed PT), OT (By licensed OT)     PT Frequency:  (5) OT Frequency:  (5)            Contractures      Additional Factors Info  Code Status, Allergies Code Status Info:  (Full Code) Allergies Info:  (Aspirin, Codeine, Morphine, Pregabalin, Propoxyphene, and Sulfa  Antibiotics)           Current Medications (11/20/2015):  This is the current hospital active medication list Current Facility-Administered Medications  Medication Dose Route Frequency Provider Last Rate Last Dose  . 0.9 %  sodium  chloride infusion   Intravenous Continuous Shaune Pollack, MD 50 mL/hr at 11/20/15 0739    . acetaminophen (TYLENOL) tablet 1,000 mg  1,000 mg Oral Q6H PRN Shaune Pollack, MD      . albuterol (PROVENTIL) (2.5 MG/3ML) 0.083% nebulizer solution 2.5 mg  2.5 mg Nebulization Q4H PRN Shaune Pollack, MD      . ALPRAZolam Prudy Feeler) tablet 0.5 mg  0.5 mg Oral Q4H PRN Shaune Pollack, MD      . benzonatate (TESSALON) capsule 200 mg  200 mg Oral TID PRN Shaune Pollack, MD      . ceFEPIme (MAXIPIME) 2 g in dextrose 5 % 50 mL IVPB  2 g Intravenous Q12H Shaune Pollack, MD   2 g at 11/20/15 1020  . clopidogrel (PLAVIX) tablet 75 mg  75 mg Oral Daily Shaune Pollack, MD   75 mg at 11/20/15 1020  . diphenoxylate-atropine (LOMOTIL) 2.5-0.025 MG per tablet 2 tablet  2 tablet Oral QID PRN Shaune Pollack, MD      . donepezil (ARICEPT) tablet 10 mg  10 mg Oral QHS Shaune Pollack, MD   10 mg at 11/19/15 2123  . enoxaparin (LOVENOX) injection 40 mg  40 mg Subcutaneous Q24H Shaune Pollack, MD   40 mg at 11/19/15 1329  . guaiFENesin-dextromethorphan (ROBITUSSIN DM) 100-10 MG/5ML syrup 5 mL  5 mL Oral Q4H PRN Shaune Pollack, MD      . mometasone-formoterol [facility]) 100-5 MCG/ACT inhaler 2 puff  2 puff Inhalation BID Shaune Pollack, MD   2 puff at 11/20/15 0739  . pantoprazole (PROTONIX) EC tablet 40 mg  40 mg Oral Daily Shaune Pollack, MD   40 mg at 11/20/15 1020  . pregabalin (LYRICA) capsule 150 mg  150 mg Oral BID Shaune Pollack, MD   150 mg at 11/20/15 1020  . simvastatin (ZOCOR) tablet 10 mg  10 mg Oral QHS Shaune Pollack, MD   10 mg at 11/19/15 2124  . SUMAtriptan (IMITREX) tablet 25 mg  25 mg Oral PRN Shaune Pollack, MD      . tiotropium [facility]) inhalation capsule 18 mcg  18 mcg Inhalation Daily Shaune Pollack, MD   18 mcg at 11/20/15 1020  . vancomycin (VANCOCIN) 500 mg in sodium chloride 0.9 % 100 mL IVPB  500 mg Intravenous Q18H Shaune Pollack, MD   500 mg at 11/20/15 4098     Discharge Medications: Please see discharge summary for a list of discharge medications.  Relevant Imaging  Results:  Relevant Lab Results:   Additional Information  (SSN: [id number])  Verta Ellen Sunkins, LCSW

## 2015-11-20 NOTE — Significant Event (Signed)
Initial pulmonary evaluation has been performed Full note to follow  Billy Fischeravid Simonds, MD PCCM service Mobile [phone] Pager [phone]

## 2015-11-20 NOTE — Evaluation (Signed)
Physical Therapy Evaluation Patient Details Name: Cheryl [name] MRN: [id number] DOB: 1951-06-07 Today's Date: 11/20/2015   History of Present Illness  Patient has recently been discharged from [facility], and now returns to [facility] from [facility] stay after admission on 11/09/2015. Presents with fever and shortness of breath as well as progressive weakness.   Clinical Impression  Patient was recently discharged to [facility] and was doing quite well, when she began to develop a fever and shortness of breath. In this session she demonstrates generalized weakness with minimal step lengths and significant fatigue after ambulation. No loss of balance noted, though assistance for trunkal mobility in bed transfer and minimal gait tolerance during this session. She is unable to increase speed or step length with cuing, and would benefit from additional rehab stay prior to return home. Skilled acute PT services are indicated to continue to address her mobility deficits.     Follow Up Recommendations SNF    Equipment Recommendations  None recommended by PT    Recommendations for Other Services       Precautions / Restrictions Precautions Precautions: Fall Restrictions Weight Bearing Restrictions: No      Mobility  Bed Mobility Overal bed mobility: Needs Assistance Bed Mobility: Supine to Sit     Supine to sit: Min guard;Min assist     General bed mobility comments: Patient requires assistance for elevating her trunk to come to edge of bed.   Transfers Overall transfer level: Needs assistance Equipment used: Rolling walker (2 wheeled) Transfers: Sit to/from Stand Sit to Stand: Min guard         General transfer comment: Patient demonstrates good hand placement with sit to stand transfer, no assistance required but delayed timing in transfer.   Ambulation/Gait Ambulation/Gait assistance: Supervision Ambulation Distance (Feet): 30 Feet Assistive device: Rolling walker (2 wheeled) Gait  Pattern/deviations: Step-to pattern;Decreased step length - left;Decreased step length - right;Trunk flexed;Narrow base of support   Gait velocity interpretation: Below normal speed for age/gender General Gait Details: Patient demonstrates short step to gait pattern with significant kyphotic posture with RW. Patient has labored breathing throughout ambulation, and fatigued quickly with ambulation. Step to with RLE advancing first and LLE following.   Stairs            Wheelchair Mobility    Modified Rankin (Stroke Patients Only)       Balance Overall balance assessment: Needs assistance Sitting-balance support: No upper extremity supported Sitting balance-Leahy Scale: Fair Sitting balance - Comments: No balance deficits identified in sitting.    Standing balance support: Bilateral upper extremity supported Standing balance-Leahy Scale: Fair Standing balance comment: Very kyphotic posture with RW, minimal step lengths indicating she is at high risk for falling. Reports multiple falls in the last 6 months.                              Pertinent Vitals/Pain Pain Assessment: No/denies pain    Home Living Family/patient expects to be discharged to:: Skilled nursing facility Living Arrangements: Spouse/significant other Available Help at Discharge: Family;Available PRN/intermittently Type of Home: House Home Access: Ramped entrance     Home Layout: One level Home Equipment: Walker - 2 wheels;Walker - 4 wheels;Cane - single point;Shower seat;Wheelchair - manual      Prior Function Level of Independence: Needs assistance   Gait / Transfers Assistance Needed: Independent with rolling walker  ADL's / Homemaking Assistance Needed: From prior PT notes she has required assistance  with some ADLs/IADLs, though recently had been bathing independently.         Hand Dominance        Extremity/Trunk Assessment   Upper Extremity Assessment: Generalized weakness            Lower Extremity Assessment: Generalized weakness (Patient with generally 4/5 MMT in hip flexors/ knee extensors. )      Cervical / Trunk Assessment: Kyphotic  Communication   Communication: No difficulties  Cognition Arousal/Alertness: Awake/alert Behavior During Therapy: WFL for tasks assessed/performed Overall Cognitive Status: Within Functional Limits for tasks assessed                      General Comments      Exercises        Assessment/Plan    PT Assessment Patient needs continued PT services  PT Diagnosis Difficulty walking;Generalized weakness   PT Problem List Decreased strength;Decreased range of motion;Decreased activity tolerance;Decreased balance;Decreased mobility;Decreased coordination;Decreased knowledge of use of DME;Decreased safety awareness;Decreased knowledge of precautions;Cardiopulmonary status limiting activity;Pain  PT Treatment Interventions DME instruction;Gait training;Stair training;Functional mobility training;Therapeutic activities;Therapeutic exercise;Balance training;Neuromuscular re-education;Patient/family education   PT Goals (Current goals can be found in the Care Plan section) Acute Rehab PT Goals Patient Stated Goal: Patient would like to return home soon.  PT Goal Formulation: With patient/family Time For Goal Achievement: 12/04/15 Potential to Achieve Goals: Fair    Frequency Min 2X/week   Barriers to discharge Decreased caregiver support Husband still works and is unable to be at home 24/7.     Co-evaluation               End of Session Equipment Utilized During Treatment: Gait belt Activity Tolerance: Patient limited by fatigue Patient left: in chair;with call bell/phone within reach;with chair alarm set;with family/visitor present Nurse Communication: Mobility status         Time: 1405-1420 PT Time Calculation (min) (ACUTE ONLY): 15 min   Charges:   PT Evaluation $Initial PT Evaluation  Tier I: 1 Procedure     PT G Codes:       Kerin RansomPatrick A McNamara, PT, DPT    11/20/2015, 4:04 PM

## 2015-11-20 NOTE — Progress Notes (Addendum)
[facility] - [place] at [facility]   PATIENT NAME: Cheryl DimmerMary [name]    MR#:  [id number]  DATE OF BIRTH:  1951/02/04  SUBJECTIVE:  CHIEF COMPLAINT:   Chief Complaint  Patient presents with  . Code Sepsis   Cough, SOB, on O2NC 2L REVIEW OF SYSTEMS:  CONSTITUTIONAL: No fever, has weakness.  EYES: No blurred or double vision.  EARS, NOSE, AND THROAT: No tinnitus or ear pain.  RESPIRATORY: has cough, shortness of breath,no  wheezing or hemoptysis.  CARDIOVASCULAR: No chest pain, orthopnea, edema.  GASTROINTESTINAL: No nausea, vomiting, diarrhea or abdominal pain.  GENITOURINARY: No dysuria, hematuria.  ENDOCRINE: No polyuria, nocturia,  HEMATOLOGY: No anemia, easy bruising or bleeding SKIN: No rash or lesion. MUSCULOSKELETAL: No joint pain or arthritis.   NEUROLOGIC: No tingling, numbness, weakness.  PSYCHIATRY: No anxiety or depression.   DRUG ALLERGIES:   Allergies  Allergen Reactions  . Percocet [Oxycodone-Acetaminophen] Hives  . Aspirin Nausea And Vomiting and Other (See Comments)    Reaction:  GI upset   . Codeine Nausea And Vomiting and Other (See Comments)    Reaction:  GI upset   . Morphine Other (See Comments)    Reaction:  Unknown   . Pregabalin Other (See Comments)    Reaction:  Makes pt sleep excessively.    . Propoxyphene Other (See Comments)    Reaction:  GI upset   . Sulfa Antibiotics Rash and Other (See Comments)    Reaction:  GI upset     VITALS:  Blood pressure 129/56, pulse 95, temperature 98.6 F (37 C), temperature source Oral, resp. rate 20, height 5\' 1"  (1.549 m), weight 45.813 kg (101 lb), SpO2 92 %.  PHYSICAL EXAMINATION:  GENERAL:  64 y.o.-year-old patient lying in the bed with no acute distress.  EYES: Pupils equal, round, reactive to light and accommodation. No scleral icterus. Extraocular muscles intact.  HEENT: Head atraumatic, normocephalic. Oropharynx and nasopharynx clear.  NECK:  Supple, no jugular venous  distention. No thyroid enlargement, no tenderness.  LUNGS: Normal breath sounds bilaterally, no wheezing, b/l crackles. No use of accessory muscles of respiration.  CARDIOVASCULAR: S1, S2 normal. No murmurs, rubs, or gallops.  ABDOMEN: Soft, nontender, nondistended. Bowel sounds present. No organomegaly or mass.  EXTREMITIES: No pedal edema, cyanosis, or clubbing.  NEUROLOGIC: Cranial nerves II through XII are intact. Muscle strength 3-4/5 in all extremities. Sensation intact. Gait not checked.  PSYCHIATRIC: The patient is alert and oriented x 3.  SKIN: No obvious rash, lesion, or ulcer.    LABORATORY PANEL:   CBC  Recent Labs Lab 11/20/15 0255  WBC 36.5*  HGB 7.3*  HCT 22.4*  PLT 292   ------------------------------------------------------------------------------------------------------------------  Chemistries   Recent Labs Lab 11/19/15 0853  NA 138  K 4.0  CL 103  CO2 30  GLUCOSE 95  BUN 18  CREATININE 1.28*  CALCIUM 8.1*  AST 23  ALT 9*  ALKPHOS 95  BILITOT 0.5   ------------------------------------------------------------------------------------------------------------------  Cardiac Enzymes  Recent Labs Lab 11/19/15 0853  TROPONINI <0.03   ------------------------------------------------------------------------------------------------------------------  RADIOLOGY:  Dg Chest Port 1 View  11/19/2015  CLINICAL DATA:  Hypotension. Patient is currently being treated for pneumonia. EXAM: PORTABLE CHEST 1 VIEW COMPARISON:  November 09, 2015 FINDINGS: The heart size and mediastinal contours are stable. The heart size is enlarged. There is consolidation of the right mid and lung base worse compared to prior exam. There is a small right pleural effusion. There is interval developed mild patchy opacity  of medial left lung base. The visualized skeletal structures are stable. IMPRESSION: Worsened pneumonia of the right mid and lung base with associated pleural  effusion compared to prior chest x-ray of November 09, 2015. Interval developed mild patchy opacity of medial left lung base probably at least in part due to atelectasis but superimposed pneumonia is not excluded. Electronically Signed   By: Sherian Rein M.D.   On: 11/19/2015 09:37    EKG:   Orders placed or performed during the hospital encounter of 11/19/15  . ED EKG 12-Lead  . ED EKG 12-Lead    ASSESSMENT AND PLAN:    Acute respiratory failure with hypoxia On oxygen by nasal cannula, Off BiPAP, continue Xopenex when necessary, continue Spiriva and dulera.  Septic shock. Hypotension improved. Worsening leukocytosis. The patient was treated with normal saline bolus 2 L in the ED. discontinue normal saline IV,  continue cefepime and vancomycin, follow-up CBC, blood culture is negative so far. Hold home Lasix.  Pneumonia HAP. She was treated with antibiotics in the ED.I will continue cefepime and vancomycin, follow-up CBC, blood culture and sputum culture.  COPD. continue nebulizer treatment.  Anemia of chronic disease. stable.    Start PT.  All the records are reviewed and case discussed with Care Management/Social Workerr. Management plans discussed with the patient, family and they are in agreement.  CODE STATUS: Full code  TOTAL TIME TAKING CARE OF THIS PATIENT: 38 minutes.  Greater than 50% time was spent on coordination of care and face-to-face counseling.  POSSIBLE D/C IN 3 DAYS, DEPENDING ON CLINICAL CONDITION.   Shaune Pollack M.D on 11/20/2015 at 2:31 PM  Between 7am to 6pm - Pager - [phone]  After 6pm go to [email or web address] - password [email or web address] [facility]  [facility] [place] [facility]  Office  [phone]  CC: Primary care physician; Mila Merry, MD

## 2015-11-21 DIAGNOSIS — J438 Other emphysema: Secondary | ICD-10-CM

## 2015-11-21 DIAGNOSIS — J189 Pneumonia, unspecified organism: Secondary | ICD-10-CM

## 2015-11-21 LAB — BASIC METABOLIC PANEL
Anion gap: 6 (ref 5–15)
BUN: 17 mg/dL (ref 6–20)
CO2: 28 mmol/L (ref 22–32)
Calcium: 8.2 mg/dL — ABNORMAL LOW (ref 8.9–10.3)
Chloride: 105 mmol/L (ref 101–111)
Creatinine, Ser: 0.71 mg/dL (ref 0.44–1.00)
GFR calc non Af Amer: >60 mL/min (ref >60)
GLUCOSE: 88 mg/dL (ref 65–99)
Potassium: 3.6 mmol/L (ref 3.5–5.1)
Sodium: 139 mmol/L (ref 135–145)

## 2015-11-21 LAB — CBC
HEMATOCRIT: 22.5 % — AB (ref 35.0–47.0)
HEMOGLOBIN: 7.4 g/dL — AB (ref 12.0–16.0)
MCH: 29.6 pg (ref 26.0–34.0)
MCHC: 32.7 g/dL (ref 32.0–36.0)
MCV: 90.8 fL (ref 80.0–100.0)
Platelets: 307 10*3/uL (ref 150–440)
RBC: 2.48 MIL/uL — ABNORMAL LOW (ref 3.80–5.20)
RDW: 15.7 % — ABNORMAL HIGH (ref 11.5–14.5)
WBC: 28.8 10*3/uL — ABNORMAL HIGH (ref 3.6–11.0)

## 2015-11-21 MED ORDER — VANCOMYCIN HCL 500 MG IV SOLR
500.0000 mg | Freq: Two times a day (BID) | INTRAVENOUS | Status: DC
  Administered 2015-11-21 – 2015-11-22 (×3): 500 mg via INTRAVENOUS
  Filled 2015-11-21 (×4): qty 500

## 2015-11-21 NOTE — Progress Notes (Signed)
[facility] - [place] at [facility]   PATIENT NAME: Cheryl [name]    MR#:  [id number]  DATE OF BIRTH:  [birth year]  SUBJECTIVE:  CHIEF COMPLAINT:   Chief Complaint  Patient presents with  . Code Sepsis   Still Cough, SOB and drowsy,  on O2NC 2L REVIEW OF SYSTEMS:  CONSTITUTIONAL: No fever, has weakness.  EYES: No blurred or double vision.  EARS, NOSE, AND THROAT: No tinnitus or ear pain.  RESPIRATORY: has cough, shortness of breath,no  wheezing or hemoptysis.  CARDIOVASCULAR: No chest pain, orthopnea, edema.  GASTROINTESTINAL: No nausea, vomiting, diarrhea or abdominal pain.  GENITOURINARY: No dysuria, hematuria.  ENDOCRINE: No polyuria, nocturia,  HEMATOLOGY: No anemia, easy bruising or bleeding SKIN: No rash or lesion. MUSCULOSKELETAL: No joint pain or arthritis.   NEUROLOGIC: No tingling, numbness, weakness.  PSYCHIATRY: No anxiety or depression.   DRUG ALLERGIES:   Allergies  Allergen Reactions  . Percocet [Oxycodone-Acetaminophen] Hives  . Aspirin Nausea And Vomiting and Other (See Comments)    Reaction:  GI upset   . Codeine Nausea And Vomiting and Other (See Comments)    Reaction:  GI upset   . Morphine Other (See Comments)    Reaction:  Unknown   . Pregabalin Other (See Comments)    Reaction:  Makes pt sleep excessively.    . Propoxyphene Other (See Comments)    Reaction:  GI upset   . Sulfa Antibiotics Rash and Other (See Comments)    Reaction:  GI upset     VITALS:  Blood pressure 128/55, pulse 92, temperature 97.9 F (36.6 C), temperature source Oral, resp. rate 28, height  (1.549 m), weight 45.813 kg (101 lb), SpO2 96 %.  PHYSICAL EXAMINATION:  GENERAL:  64 y.o.-year-old patient lying in the bed with lethargy. EYES: Pupils equal, round, reactive to light and accommodation. No scleral icterus. Extraocular muscles intact.  HEENT: Head atraumatic, normocephalic. Oropharynx and nasopharynx clear.  NECK:  Supple, no jugular  venous distention. No thyroid enlargement, no tenderness.  LUNGS: Normal breath sounds bilaterally, no wheezing, b/l crackles. No use of accessory muscles of respiration.  CARDIOVASCULAR: S1, S2 normal. No murmurs, rubs, or gallops.  ABDOMEN: Soft, nontender, nondistended. Bowel sounds present. No organomegaly or mass.  EXTREMITIES: No pedal edema, cyanosis, or clubbing.  NEUROLOGIC: Cranial nerves II through XII are intact. Muscle strength 3-4/5 in all extremities. Sensation intact. Gait not checked.  PSYCHIATRIC: The patient is alert and oriented x 3.  SKIN: No obvious rash, lesion, or ulcer.    LABORATORY PANEL:   CBC  Recent Labs Lab 11/21/15 0525  WBC 28.8*  HGB 7.4*  HCT 22.5*  PLT 307   ------------------------------------------------------------------------------------------------------------------  Chemistries   Recent Labs Lab 11/19/15 0853 11/21/15 0525  NA 138 139  K 4.0 3.6  CL 103 105  CO2 30 28  GLUCOSE 95 88  BUN 18 17  CREATININE 1.28* 0.71  CALCIUM 8.1* 8.2*  AST 23  --   ALT 9*  --   ALKPHOS 95  --   BILITOT 0.5  --    ------------------------------------------------------------------------------------------------------------------  Cardiac Enzymes  Recent Labs Lab 11/19/15 0853  TROPONINI <0.03   ------------------------------------------------------------------------------------------------------------------  RADIOLOGY:  No results found.  EKG:   Orders placed or performed during the hospital encounter of 11/19/15  . ED EKG 12-Lead  . ED EKG 12-Lead    ASSESSMENT AND PLAN:    Acute respiratory failure with hypoxia On oxygen by nasal cannula, Off BiPAP, continue  Xopenex when necessary, continue Spiriva and dulera.  Septic shock. Hypotension improved.  Leukocytosis is improving. The patient was treated with normal saline bolus 2 L in the ED. discontinued normal saline IV,  continue cefepime and vancomycin, follow-up CBC, blood  culture is negative so far. Hold home Lasix.  Pneumonia HAP. She was treated with antibiotics in the ED.I will continue cefepime and vancomycin, follow-up CBC, blood culture and sputum culture.  COPD. continue nebulizer treatment.  Anemia of chronic disease. stable.    Start PT.  All the records are reviewed and case discussed with Care Management/Social Workerr. Management plans discussed with the patient, family and they are in agreement.  CODE STATUS: Full code  TOTAL TIME TAKING CARE OF THIS PATIENT: 41 minutes.  Greater than 50% time was spent on coordination of care and face-to-face counseling.  POSSIBLE D/C IN 3 DAYS, DEPENDING ON CLINICAL CONDITION.   Shaune Pollackhen, Emori Mumme M.D on 11/21/2015 at 11:30 AM  Between 7am to 6pm - Pager - [phone]  After 6pm go to [email or web address] - password [email or web address] [email or web address]  [place] [place] [facility]  Office  [phone]  CC: Primary care physician; Mila Merryonald Fisher, MD

## 2015-11-21 NOTE — NC FL2 (Signed)
[place] MEDICAID FL2 LEVEL OF CARE SCREENING TOOL     IDENTIFICATION  Patient Name: Cheryl [name] Birthdate: 11-Dec-1950 Sex: female Admission Date (Current Location): 11/19/2015  [facility] and [place] Number:  [address]  ([id number] [facility]) Facility and Address:  [facility], [address] [place], [place] [address]      Provider Number: [id number]  Attending Physician Name and Address:  Shaune Pollack, MD  Relative Name and Phone Number:       Current Level of Care: Hospital Recommended Level of Care: Skilled Nursing Facility Prior Approval Number:    Date Approved/Denied:   PASRR Number: [id number]  Discharge Plan: Home    Current Diagnoses: Patient Active Problem List   Diagnosis Date Noted  . Pneumonia 11/19/2015  . Malnutrition of moderate degree 11/10/2015  . Sepsis (HCC) 10/17/2015  . Abnormal mammogram of right breast 10/11/2015  . Dilated intrahepatic bile duct 10/11/2015  . Anemia 10/11/2015  . Lumbar radicular pain 09/04/2015  . Chronic low back pain 09/04/2015  . Other long term (current) drug therapy 09/04/2015  . Encounter for long-term opiate analgesic use 09/04/2015  . Encounter for therapeutic drug level monitoring 09/04/2015  . Opiate use 09/04/2015  . Uncomplicated opioid dependence (HCC) 09/04/2015  . Chronic pain syndrome 09/04/2015  . Platelet inhibition due to Plavix 09/04/2015  . COPD (chronic obstructive pulmonary disease) (HCC) 07/31/2015  . Dementia 07/31/2015  . Gastrointestinal complaints, nonspecific 07/31/2015  . Absolute anemia 07/03/2015  . Airway hyperreactivity 07/03/2015  . Back pain, thoracic 07/03/2015  . Diaphoresis 07/03/2015  . Excessive falling 07/03/2015  . Alteration in bowel elimination: incontinence 07/03/2015  . Insomnia 07/03/2015  . Decreased testosterone level 07/03/2015  . Leg weakness 07/03/2015  . Menopausal symptom 07/03/2015  . Migraine 07/03/2015  . Neuropathy (HCC) 07/03/2015   . Fecal occult blood test positive 07/03/2015  . OP (osteoporosis) 07/03/2015  . Episodic paroxysmal anxiety disorder 07/03/2015  . Compulsive tobacco user syndrome 07/03/2015  . Urinary incontinence 07/03/2015  . Weight loss 07/03/2015  . Essential hypertension 06/06/2015  . GERD (gastroesophageal reflux disease) 06/06/2015  . Hyperlipemia 06/06/2015  . Disorder of peripheral nervous system (HCC) 04/13/2014  . 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 Anxiety 02/10/2014  . Coronary artery disease 02/10/2014  . Peripheral blood vessel disorder (HCC) 02/10/2014  . Hypercholesteremia 02/10/2014  . Peripheral vascular disease (HCC) 02/10/2014  . Vitamin D deficiency 10/16/2009    Orientation RESPIRATION BLADDER Height & Weight    Self, Time, Situation, Place  Normal Incontinent  (165.1 cm) 189 lbs.  BEHAVIORAL SYMPTOMS/MOOD NEUROLOGICAL BOWEL NUTRITION STATUS   (None)  (None) Continent Diet (Regular Diet, Thin Liquids)  AMBULATORY STATUS COMMUNICATION OF NEEDS Skin   Extensive Assist Verbally Normal                       Personal Care Assistance Level of Assistance  Bathing, Feeding, Dressing Bathing Assistance: Limited assistance Feeding assistance: Limited assistance Dressing Assistance: Limited assistance     Functional Limitations Info  Sight, Hearing, Speech Sight Info: Adequate Hearing Info: Adequate Speech Info: Adequate    SPECIAL CARE FACTORS FREQUENCY  PT (By licensed PT)     PT Frequency: 5 OT Frequency:  (5)            Contractures      Additional Factors Info  Code Status, Allergies Code Status Info: Full Code Allergies Info: Allergies: Percocet, Aspirin, Codeine, Morphine, Pregabalin, Propoxyphene, Sulfa Antibiotics  Current Medications (11/21/2015):  This is the current hospital active medication list Current Facility-Administered Medications  Medication Dose Route Frequency Provider Last Rate Last Dose  .  acetaminophen (TYLENOL) tablet 1,000 mg  1,000 mg Oral Q6H PRN Shaune PollackQing Aries Townley, MD      . albuterol (PROVENTIL) (2.5 MG/3ML) 0.083% nebulizer solution 2.5 mg  2.5 mg Nebulization Q4H PRN Shaune PollackQing Marsella Suman, MD   2.5 mg at 11/21/15 0831  . ALPRAZolam Prudy Feeler(XANAX) tablet 0.5 mg  0.5 mg Oral Q4H PRN Shaune PollackQing Marciana Uplinger, MD      . benzonatate (TESSALON) capsule 200 mg  200 mg Oral TID PRN Shaune PollackQing Kiandria Clum, MD      . ceFEPIme (MAXIPIME) 2 g in dextrose 5 % 50 mL IVPB  2 g Intravenous Q12H Shaune PollackQing Shaelynn Dragos, MD   2 g at 11/21/15 0843  . clopidogrel (PLAVIX) tablet 75 mg  75 mg Oral Daily Shaune PollackQing Miria Cappelli, MD   75 mg at 11/21/15 0843  . diphenoxylate-atropine (LOMOTIL) 2.5-0.025 MG per tablet 2 tablet  2 tablet Oral QID PRN Shaune PollackQing Addam Goeller, MD      . donepezil (ARICEPT) tablet 10 mg  10 mg Oral QHS Shaune PollackQing Eran Windish, MD   10 mg at 11/20/15 2128  . enoxaparin (LOVENOX) injection 40 mg  40 mg Subcutaneous Q24H Shaune PollackQing Marjorie Lussier, MD   40 mg at 11/21/15 1154  . fentaNYL (DURAGESIC - dosed mcg/hr) 50 mcg  50 mcg Transdermal Q72H Oralia Manisavid Willis, MD   50 mcg at 11/20/15 2240  . guaiFENesin-dextromethorphan (ROBITUSSIN DM) 100-10 MG/5ML syrup 5 mL  5 mL Oral Q4H PRN Shaune PollackQing Hillarie Harrigan, MD      . mometasone-formoterol [facility](DULERA) 100-5 MCG/ACT inhaler 2 puff  2 puff Inhalation BID Shaune PollackQing Ellanie Oppedisano, MD   2 puff at 11/21/15 [phone]  . pantoprazole (PROTONIX) EC tablet 40 mg  40 mg Oral Daily Shaune PollackQing Monquie Fulgham, MD   40 mg at 11/21/15 0843  . pregabalin (LYRICA) capsule 150 mg  150 mg Oral BID Shaune PollackQing Maiah Sinning, MD   150 mg at 11/21/15 0843  . simvastatin (ZOCOR) tablet 10 mg  10 mg Oral QHS Shaune PollackQing Shazia Mitchener, MD   10 mg at 11/20/15 2128  . SUMAtriptan (IMITREX) tablet 25 mg  25 mg Oral PRN Shaune PollackQing Moroni Nester, MD      . tiotropium [facility](SPIRIVA) inhalation capsule 18 mcg  18 mcg Inhalation Daily Shaune PollackQing Deiontae Rabel, MD   18 mcg at 11/21/15 [phone]  . vancomycin (VANCOCIN) 500 mg in sodium chloride 0.9 % 100 mL IVPB  500 mg Intravenous Q12H Shaune PollackQing Nona Gracey, MD   500 mg at 11/21/15 1022     Discharge Medications: Please see discharge summary for a list of discharge  medications.  Relevant Imaging Results:  Relevant Lab Results:   Additional Information SSN:  [id number]  Dede QuerySarah McNulty, LCSW

## 2015-11-21 NOTE — Progress Notes (Signed)
Physical Therapy Treatment Patient Details Name: Cheryl [name] MRN: [id number] DOB: 07-06-1951 Today's Date: 11/21/2015    History of Present Illness Patient has recently been discharged from [facility], and now returns to [facility] [place] from [facility] stay after admission on 11/09/2015. Presents with fever and shortness of breath as well as progressive weakness.     PT Comments    Pt initially notes she is not feeling well, but agreeable to PT. Pt complains of fatigue, general malaise and shortness of breath. Pt's O2 saturation on 2 liters O2 is 93% throughout session and heart rate is between 94 and 96 beats per minute. Pt participates in seated edge of bed exercises and brief standing in preparation for ambulation. Pt stands briefly with Min A and shortly thereafter, refuses ambulation with request to sit. Pt wishes back to bed and refuses offer to sit up in chair. Pt returned to bed comfortably. Continue PT to progress strength, endurance and all functional mobility.   Follow Up Recommendations  SNF     Equipment Recommendations  None recommended by PT    Recommendations for Other Services       Precautions / Restrictions Precautions Precautions: Fall Restrictions Weight Bearing Restrictions: No    Mobility  Bed Mobility Overal bed mobility: Needs Assistance Bed Mobility: Supine to Sit;Sit to Supine     Supine to sit: Min guard;HOB elevated Sit to supine: Min assist;HOB elevated   General bed mobility comments: Min A to reposition upward in bed  Transfers Overall transfer level: Needs assistance Equipment used: Rolling walker (2 wheeled) Transfers: Sit to/from Stand Sit to Stand: Min guard         General transfer comment: After short stand, pt notes need to sit and refuses ambulation wishing back to bed  Ambulation/Gait             General Gait Details: refuses   Stairs            Wheelchair Mobility    Modified Rankin (Stroke Patients Only)       Balance  Overall balance assessment: Needs assistance Sitting-balance support: No upper extremity supported Sitting balance-Leahy Scale: Fair     Standing balance support: Bilateral upper extremity supported Standing balance-Leahy Scale: Fair                      Cognition Arousal/Alertness: Awake/alert (Fatigued) Behavior During Therapy: WFL for tasks assessed/performed;Restless Overall Cognitive Status: Within Functional Limits for tasks assessed                      Exercises General Exercises - Lower Extremity Long Arc Quad: AROM;Both;20 reps;Seated Hip Flexion/Marching: AROM;Both;20 reps;Seated Toe Raises: AROM;Both;20 reps;Seated Heel Raises: AROM;Both;20 reps;Seated    General Comments        Pertinent Vitals/Pain Pain Assessment: No/denies pain    Home Living                      Prior Function            PT Goals (current goals can now be found in the care plan section) Progress towards PT goals: Not progressing toward goals - comment    Frequency  Min 2X/week    PT Plan Current plan remains appropriate    Co-evaluation             End of Session Equipment Utilized During Treatment: Gait belt;Oxygen Activity Tolerance: Patient limited by fatigue (limited by shortness of breath)  Patient left: in bed;with call bell/phone within reach;with bed alarm set     Time: [id number] PT Time Calculation (min) (ACUTE ONLY): 17 min  Charges:  $Therapeutic Exercise: 8-22 mins                    G Codes:      Kristeen MissHeidi Elizabeth Bishop 11/21/2015, 4:29 PM

## 2015-11-21 NOTE — Progress Notes (Signed)
ANTIBIOTIC CONSULT NOTE - Follow up  Pharmacy Consult for Cefepime/Vancocmycin Indication: pneumonia  Allergies  Allergen Reactions  . Percocet [Oxycodone-Acetaminophen] Hives  . Aspirin Nausea And Vomiting and Other (See Comments)    Reaction:  GI upset   . Codeine Nausea And Vomiting and Other (See Comments)    Reaction:  GI upset   . Morphine Other (See Comments)    Reaction:  Unknown   . Pregabalin Other (See Comments)    Reaction:  Makes pt sleep excessively.    . Propoxyphene Other (See Comments)    Reaction:  GI upset   . Sulfa Antibiotics Rash and Other (See Comments)    Reaction:  GI upset     Patient Measurements: Height: 5\' 1"  (154.9 cm) Weight: 101 lb (45.813 kg) IBW/kg (Calculated) : 47.8 Height: 62 inches Weight:   Vital Signs: Temp: 97.9 F (36.6 C) (12/27 0520) Temp Source: Oral (12/27 0520) BP: 128/55 mmHg (12/27 0520) Pulse Rate: 92 (12/27 0520) Intake/Output from previous day: 12/26 0701 - 12/27 0700 In: 424.2 [I.V.:424.2] Out: 1050 [Urine:1050] Intake/Output from this shift: Total I/O In: -  Out: 200 [Urine:200]  Labs:  Recent Labs  11/19/15 0853 11/20/15 0255 11/21/15 0525  WBC 21.9* 36.5* 28.8*  HGB 8.6* 7.3* 7.4*  PLT 334 292 307  CREATININE 1.28*  --  0.71   Estimated Creatinine Clearance: 50 mL/min (by C-G formula based on Cr of 0.71).  Microbiology: Recent Results (from the past 720 hour(s))  Blood Culture (routine x 2)     Status: None   Collection Time: 11/09/15  8:25 PM  Result Value Ref Range Status   Specimen Description BLOOD RIGHT ANTECUBITAL  Final   Special Requests BOTTLES DRAWN AEROBIC AND ANAEROBIC 10ML  Final   Culture NO GROWTH 5 DAYS  Final   Report Status 11/14/2015 FINAL  Final  Blood Culture (routine x 2)     Status: None   Collection Time: 11/09/15  8:30 PM  Result Value Ref Range Status   Specimen Description BLOOD LEFT HAND  Final   Special Requests BOTTLES DRAWN AEROBIC AND ANAEROBIC 10ML  Final    Culture NO GROWTH 5 DAYS  Final   Report Status 11/14/2015 FINAL  Final  Urine culture     Status: None   Collection Time: 11/09/15  8:40 PM  Result Value Ref Range Status   Specimen Description URINE, RANDOM  Final   Special Requests NONE  Final   Culture NO GROWTH 2 DAYS  Final   Report Status 11/12/2015 FINAL  Final  MRSA PCR Screening     Status: None   Collection Time: 11/10/15  2:20 AM  Result Value Ref Range Status   MRSA by PCR NEGATIVE NEGATIVE Final    Comment:        The GeneXpert MRSA Assay (FDA approved for NASAL specimens only), is one component of a comprehensive MRSA colonization surveillance program. It is not intended to diagnose MRSA infection nor to guide or monitor treatment for MRSA infections.   Blood Culture (routine x 2)     Status: None (Preliminary result)   Collection Time: 11/19/15  8:53 AM  Result Value Ref Range Status   Specimen Description BLOOD LEFT AC  Final   Special Requests BOTTLES DRAWN AEROBIC AND ANAEROBIC 5ML  Final   Culture NO GROWTH 1 DAY  Final   Report Status PENDING  Incomplete  Urine culture     Status: None   Collection Time: 11/19/15  8:53  AM  Result Value Ref Range Status   Specimen Description URINE, RANDOM  Final   Special Requests NONE  Final   Culture NO GROWTH 1 DAY  Final   Report Status 11/20/2015 FINAL  Final  Blood Culture (routine x 2)     Status: None (Preliminary result)   Collection Time: 11/19/15  9:01 AM  Result Value Ref Range Status   Specimen Description BLOOD RIGHT AC  Final   Special Requests   Final    BOTTLES DRAWN AEROBIC AND ANAEROBIC  AEROBIC ANAEROBIC   Culture NO GROWTH 1 DAY  Final   Report Status PENDING  Incomplete  MRSA PCR Screening     Status: None   Collection Time: 11/19/15 12:07 PM  Result Value Ref Range Status   MRSA by PCR NEGATIVE NEGATIVE Final    Comment:        The GeneXpert MRSA Assay (FDA approved for NASAL specimens only), is one component of a comprehensive  MRSA colonization surveillance program. It is not intended to diagnose MRSA infection nor to guide or monitor treatment for MRSA infections.     Medical History: Past Medical History  Diagnosis Date  . COPD (chronic obstructive pulmonary disease) (HCC)   . Hypertension   . Arthritis   . Hyperlipidemia   . GERD (gastroesophageal reflux disease)   . Neuropathy (HCC) 2010  . Oxygen deficiency   . Coronary artery disease   . Headache   . Anxiety   . Peripheral vascular disease (HCC)   . Vitamin D deficiency   . Migraines   . Chronic pain   . DVT (deep venous thrombosis) (HCC)   . Pneumonia   . Osteoporosis   . Allergy   . Asthma     Medications:  Scheduled:  . ceFEPime (MAXIPIME) IV  2 g Intravenous Q12H  . clopidogrel  75 mg Oral Daily  . donepezil  10 mg Oral QHS  . enoxaparin (LOVENOX) injection  40 mg Subcutaneous Q24H  . fentaNYL  50 mcg Transdermal Q72H  . mometasone-formoterol  2 puff Inhalation BID  . pantoprazole  40 mg Oral Daily  . pregabalin  150 mg Oral BID  . simvastatin  10 mg Oral QHS  . tiotropium  18 mcg Inhalation Daily  . vancomycin (VANCOCIN) 500 mg IVPB  500 mg Intravenous Q12H   Infusions:    Assessment: 64 y/o F with COPD and recent hospitialization for PNA.  Ke: 0.031 h-1, 33.2 L  12/26: Cultures NGTD  12/27: Kel (hr-1): 0.059 Half-life (hrs): 11.75 Vd (liters): 32.07 (factor used: 0.7 L/kg)   Goal of Therapy:  Vancomycin trough level 15-20 mcg/ml  Plan:  Cefepime 2 g iv q 12 hours.   Vancomycin 1000 mg once given in ED. Will order vancomycin 500 mg iv q 18 hours and a trough with the 4th dose. Will continue to follow renal function and culture results.   12/26: Will continue current regimen- follow up Scr.  12/27: Will change Vancomycin 500 mg IV q12 hours based on kinetics calculated. Will order Vancomycin trough level prior to the 1000 dose of vancomycin per protocol   Demetrius Charity, PharmD  Clinical  Pharmacist 11/21/2015 ,9:47 AM

## 2015-11-21 NOTE — Care Management Important Message (Signed)
Important Message  Patient Details  Name: Cheryl [name] MRN: [id number] Date of Birth: [birth year]   Medicare Important Message Given:  Yes    Gwenette GreetBrenda S Christella App, RN 11/21/2015, 10:21 AM

## 2015-11-21 NOTE — Clinical Social Work Note (Signed)
Clinical Social Worker spoke with facility, and pt is able to return to [facility] pending [employer] auth.   CSW also spoke with pt's husband regarding insurance. He wanted to speak to Aundra Milletose Davis regarding Medicaid. CSW provided information. Pt will still have [employer] after the first of the year. Pt's husband is addressing potential copays.   CSW will continue to follow.   Dede QuerySarah Harlow Basley, MSW, LCSW Clinical Social Worker  [phone]

## 2015-11-21 NOTE — Plan of Care (Signed)
Problem: Respiratory: Goal: Ability to maintain adequate ventilation will improve Outcome: Progressing Pt is disoriented to time, c/o back pain. Requested fentanyl patch. MD notified and ordered home dose. Continues on 2 L [place], VSS. [facility]  Continue with IV antibiotics. Family at bedside, supportive.

## 2015-11-21 NOTE — Progress Notes (Signed)
Pt is alert and oriented x 4, drowsy in between care, poor appetite, family at bedside, up to bsc with stand by assist, bm throughout shift, continues on 2 L oxygen, WBC improved, vital signs stable, receiving IV antibiotics, followed by pulmonology, uneventful shift. Pending blood cultures.

## 2015-11-21 NOTE — Consult Note (Signed)
PULMONARY CONSULT NOTE  Requesting MD/Service: [facility] Date of consult: 12/26 Reason for consultation: recurrent PNAs  PT PROFILE: Frail 34 F with hx of O2 dependent COPD, CAD, hypertension discharged to rehab facility after hospitalization 12/15-12/19/16 for HCAP, sepsis. She was discharged on levofloxacin.  HPI:  Admitted 12/25 to [facility] after returning to ED with hypoxemia and weakness. Initially treated with NPPV. CXR was noted to demonstrate increased RLL infiltrate. She was not febrile but was noted to have increased WBC count. The pt has no recall of the events preceding this current hospitalization and is generally a poor historian. She has been treated with vancomycin and cefepime since admission and reports feeling generally better. Shortly after admission, she had a low grade fever but has been afebrile since. All cultures remain negative to date. Cultures from last hospitalization were unrevealing but during a hospitalization in Nov, a sputum culture grew pansensitive klebsiella. She was seen by Dr Dema Severin in Pulmonary consultation during the hospitalization in Nov.  Past Medical History  Diagnosis Date  . COPD (chronic obstructive pulmonary disease) (HCC)   . Hypertension   . Arthritis   . Hyperlipidemia   . GERD (gastroesophageal reflux disease)   . Neuropathy (HCC) 2010  . Oxygen deficiency   . Coronary artery disease   . Headache   . Anxiety   . Peripheral vascular disease (HCC)   . Vitamin D deficiency   . Migraines   . Chronic pain   . DVT (deep venous thrombosis) (HCC)   . Pneumonia   . Osteoporosis   . Allergy   . Asthma     Past Surgical History  Procedure Laterality Date  . Appendectomy    . Spine surgery    . Foot surgery Bilateral     5-6 years per patient  . Cardiac catherization  10/31/2009  . Abdomnal aortic stent  05/30/2008    Dr. Nanetta Batty  . Abdominal hysterectomy  1975    Bilaterl Oophorectomy; Dur to IUD infection  .  Cholecystectomy  1972  . Cervical fusion  C5 - 6/C6-7  . Appendectomy    . Colonoscopy with propofol N/A 07/27/2015    Procedure: COLONOSCOPY WITH PROPOFOL;  Surgeon: Wallace Cullens, MD;  Location: [facility] ENDOSCOPY;  Service: Gastroenterology;  Laterality: N/A;  . Esophagogastroduodenoscopy (egd) with propofol N/A 07/27/2015    Procedure: ESOPHAGOGASTRODUODENOSCOPY (EGD) WITH PROPOFOL;  Surgeon: Wallace Cullens, MD;  Location: [facility] ENDOSCOPY;  Service: Gastroenterology;  Laterality: N/A;    MEDICATIONS: I have reviewed all medications and confirmed regimen as documented  Social History   Social History  . Marital Status: Married    Spouse Name: N/A  . Number of Children: 6  . Years of Education: N/A   Occupational History  . Disabled    Social History Main Topics  . Smoking status: Current Every Day Smoker -- 1.00 packs/day for 50 years    Types: Cigarettes  . Smokeless tobacco: Not on file  . Alcohol Use: No  . Drug Use: No  . Sexual Activity: Not on file   Other Topics Concern  . Not on file   Social History Narrative    Family History  Problem Relation Age of Onset  . Cancer Mother   . Arthritis Mother   . Heart disease Mother   . Diabetes Mother     mellitus, type 2  . Heart disease Father   . Diabetes Sister   . Cancer Brother   . Cancer Brother  lung  . Diabetes Brother     ROS: Unable to obtain due to lack of recall of admission events   Filed Vitals:   11/20/15 1334 11/20/15 1821 11/20/15 2123 11/21/15 0520  BP: 129/56  141/61 128/55  Pulse: 95  100 92  Temp: 98.6 F (37 C) 98.8 F (37.1 C) 98.5 F (36.9 C) 97.9 F (36.6 C)  TempSrc: Oral Oral Oral Oral  Resp: 20  12 28   Height:      Weight:      SpO2: 92%  96% 96%     EXAM:  Gen: Appears much older than true age, no overt resp distress HEENT: NCAT, edentulous, otherwise WNL Neck: Supple without LAN, thyromegaly, JVD Lungs: coarse bilateral BS/scattered rhonchi, no wheezes Cardiovascular: Reg  rhythm, rate normal, no murmurs  Abdomen: Soft, nontender, normal BS Ext: without clubbing, cyanosis, edema Neuro: CNs grossly intact, motor and sensory intact, DTRs symmetric  DATA:   BMP Latest Ref Rng 11/21/2015 11/19/2015 11/10/2015  Glucose 65 - 99 mg/dL 88 95 161(W109(H)  BUN 6 - 20 mg/dL 17 18 14   Creatinine 0.44 - 1.00 mg/dL 9.600.71 4.54(U1.28(H) 9.810.85  BUN/Creat Ratio 11 - 26 - - -  Sodium 135 - 145 mmol/L 139 138 139  Potassium 3.5 - 5.1 mmol/L 3.6 4.0 3.6  Chloride 101 - 111 mmol/L 105 103 104  CO2 22 - 32 mmol/L 28 30 27   Calcium 8.9 - 10.3 mg/dL 8.2(L) 8.1(L) 7.4(L)    CBC Latest Ref Rng 11/21/2015 11/20/2015 11/19/2015  WBC 3.6 - 11.0 K/uL 28.8(H) 36.5(H) 21.9(H)  Hemoglobin 12.0 - 16.0 g/dL 7.4(L) 7.3(L) 8.6(L)  Hematocrit 35.0 - 47.0 % 22.5(L) 22.4(L) 26.6(L)  Platelets 150 - 440 K/uL 307 292 334    CXR (12/25):  Dense RLL infiltrate - increased in density since 12/15 CT chest (11/23): Multiple areas of ground-glass and more confluent airspace opacity throughout the lungs, predominantly on the right. This is consistent with multifocal pneumonia   IMPRESSION:   Underlying COPD/emphysema - no acute bronchospasm RLL HCAP - radiographic appearance most c/w bacterial etiology Recurrent hospitalizations (3) for PNA over past 6 wks Prior hospitalizations for AMS - this raises concern for recurrent aspiration  She is on chronic opioids (Duragesic). This might be contributing to AMS and risk of aspiration  PLAN/REC:  COPD regimen reviewed - no changes recommended Continue supplemental O2 to maintain SpO2 > 90 % Agree with current abx for HCAP  Narrow as able depending on culture results  Would complete 10-14 days from current admission date  If empiric therapy (ie. No culture data to guide us) would consider Augmentin +/- levofloxacin Consider reducing any medication that might add to risk of altered mental status Check PCT in AM 12/27 Recheck CXR AM 12/27 Dr Belia HemanKasa will see AM  12/27   Merwyn Katosavid B Chikita Dogan, MD [facility], Critical Care Medicine

## 2015-11-21 NOTE — Progress Notes (Signed)
Initial Nutrition Assessment  DOCUMENTATION CODES:    RD notes pt with malnutrition criteria of moderate (non-severe) degree in context of chronic illness  INTERVENTION:   Meals and Snacks: Cater to patient preferences. Recommend liberalizing diet order to regular as pt with poor po intake and a picky eater, pt likes chocolate ice cream (not compliant to heart healthy diet order). Medical Food Supplement Therapy: will send chocolate Magic Cup on lunch and dinner trays   NUTRITION DIAGNOSIS:   Inadequate oral intake related to poor appetite as evidenced by meal completion < 25%, per patient/family report.  GOAL:   Patient will meet greater than or equal to 90% of their needs  MONITOR:    (Energy Intake, Electrolyte and Renal Profile, Pulmonary Profile, Anthropometrics)  REASON FOR ASSESSMENT:   Consult Assessment of nutrition requirement/status  ASSESSMENT:   Pt admitted with SOB secondary to pna and COPD, pt also in septic shock on admission. Pt remained asleep on visit; husband present.  Past Medical History  Diagnosis Date  . COPD (chronic obstructive pulmonary disease) (HCC)   . Hypertension   . Arthritis   . Hyperlipidemia   . GERD (gastroesophageal reflux disease)   . Neuropathy (HCC) 2010  . Oxygen deficiency   . Coronary artery disease   . Headache   . Anxiety   . Peripheral vascular disease (HCC)   . Vitamin D deficiency   . Migraines   . Chronic pain   . DVT (deep venous thrombosis) (HCC)   . Pneumonia   . Osteoporosis   . Allergy   . Asthma      Diet Order:  Diet regular Room service appropriate?: Yes; Fluid consistency:: Thin    Current Nutrition: Pt's husband report pt ate some fruit this am and possibly chocolate ice cream.   Food/Nutrition-Related History: Pt's husband reports pt is a picky eater and really has only had ice cream and strawberries brought in by family since admission. Pt's husband reports poor appetite since last admission,  eating very small amounts. Pt has previously reported to RD that she does not like Ensure/Boost supplements and will not drink. Husband reports at times she would drink a milkshake from [employer]. RD offered to send a homemade milkshake from dining services, however pt's husband declined reports she does not like them/will not drink.   Scheduled Medications:  . ceFEPime (MAXIPIME) IV  2 g Intravenous Q12H  . clopidogrel  75 mg Oral Daily  . donepezil  10 mg Oral QHS  . enoxaparin (LOVENOX) injection  40 mg Subcutaneous Q24H  . fentaNYL  50 mcg Transdermal Q72H  . mometasone-formoterol  2 puff Inhalation BID  . pantoprazole  40 mg Oral Daily  . pregabalin  150 mg Oral BID  . simvastatin  10 mg Oral QHS  . tiotropium  18 mcg Inhalation Daily  . vancomycin (VANCOCIN) 500 mg IVPB  500 mg Intravenous Q12H    Electrolyte/Renal Profile and Glucose Profile:   Recent Labs Lab 11/19/15 0853 11/21/15 0525  NA 138 139  K 4.0 3.6  CL 103 105  CO2 30 28  BUN 18 17  CREATININE 1.28* 0.71  CALCIUM 8.1* 8.2*  GLUCOSE 95 88   Protein Profile:   Recent Labs Lab 11/19/15 0853  ALBUMIN 2.7*    Gastrointestinal Profile: Last BM:  11/20/2015   Nutrition-Focused Physical Exam Findings:  Unable to complete Nutrition-Focused physical exam at this time.    Weight Change: Pt's weight is relatively stable per CHL encounters.  Skin:  Reviewed, no issues   Height:   Ht Readings from Last 1 Encounters:  11/19/15  (1.549 m)    Weight:   Wt Readings from Last 1 Encounters:  11/19/15 101 lb (45.813 kg)    Wt Readings from Last 10 Encounters:  11/19/15 101 lb (45.813 kg)  11/10/15 104 lb 9.6 oz (47.446 kg)  10/17/15 103 lb (46.72 kg)  10/09/15 93 lb (42.185 kg)  09/06/15 104 lb (47.174 kg)  09/04/15 100 lb (45.36 kg)  08/17/15 100 lb (45.36 kg)  07/31/15 101 lb 4.8 oz (45.949 kg)  07/27/15 100 lb (45.36 kg)  07/03/15 100 lb (45.36 kg)     BMI:  Body mass index is 19.09  kg/(m^2).  Estimated Nutritional Needs:   Kcal:  BEE: 963kcals, TEE: (IF 1.0-1.2)(AF 1.3) 1252-1502kcals  Protein:  47-56g protein (1.0-1.2g/kg)  Fluid:  1175-1474mL of fluid (25-11mL/kg)  EDUCATION NEEDS:   No education needs identified at this time   HIGH Care Level  Leda Quail, RD, LDN Pager [phone] Weekend/On-Call Pager [phone]

## 2015-11-22 ENCOUNTER — Inpatient Hospital Stay: Payer: Commercial Managed Care - HMO

## 2015-11-22 ENCOUNTER — Ambulatory Visit: Payer: Commercial Managed Care - HMO | Admitting: Family Medicine

## 2015-11-22 ENCOUNTER — Inpatient Hospital Stay
Admit: 2015-11-22 | Discharge: 2015-11-22 | Disposition: A | Discharge status: Home / Self Care | Payer: Commercial Managed Care - HMO | Attending: Internal Medicine | Admitting: Internal Medicine

## 2015-11-22 LAB — BASIC METABOLIC PANEL
ANION GAP: 9 (ref 5–15)
BUN: 12 mg/dL (ref 6–20)
CHLORIDE: 100 mmol/L — AB (ref 101–111)
CO2: 27 mmol/L (ref 22–32)
CREATININE: 0.65 mg/dL (ref 0.44–1.00)
Calcium: 8.2 mg/dL — ABNORMAL LOW (ref 8.9–10.3)
GFR calc non Af Amer: >60 mL/min (ref >60)
Glucose, Bld: 78 mg/dL (ref 65–99)
POTASSIUM: 3.2 mmol/L — AB (ref 3.5–5.1)
SODIUM: 136 mmol/L (ref 135–145)

## 2015-11-22 LAB — BLOOD GAS, ARTERIAL
ACID-BASE EXCESS: 5.4 mmol/L — AB (ref 0.0–3.0)
BICARBONATE: 28.6 meq/L — AB (ref 21.0–28.0)
FIO2: 28
O2 Saturation: 94.1 %
PATIENT TEMPERATURE: 37
pCO2 arterial: 35 mmHg (ref 32.0–48.0)
pH, Arterial: 7.52 — ABNORMAL HIGH (ref 7.350–7.450)
pO2, Arterial: 63 mmHg — ABNORMAL LOW (ref 83.0–108.0)

## 2015-11-22 LAB — CBC
HCT: 23 % — ABNORMAL LOW (ref 35.0–47.0)
HEMOGLOBIN: 7.6 g/dL — AB (ref 12.0–16.0)
MCH: 29.7 pg (ref 26.0–34.0)
MCHC: 33.1 g/dL (ref 32.0–36.0)
MCV: 89.6 fL (ref 80.0–100.0)
PLATELETS: 304 10*3/uL (ref 150–440)
RBC: 2.57 MIL/uL — AB (ref 3.80–5.20)
RDW: 15.6 % — ABNORMAL HIGH (ref 11.5–14.5)
WBC: 17.3 10*3/uL — AB (ref 3.6–11.0)

## 2015-11-22 LAB — PROCALCITONIN: Procalcitonin: 9.03 ng/mL

## 2015-11-22 LAB — VANCOMYCIN, TROUGH: Vancomycin Tr: 6 ug/mL — ABNORMAL LOW (ref 10–20)

## 2015-11-22 LAB — AMMONIA

## 2015-11-22 MED ORDER — FUROSEMIDE 20 MG PO TABS
20.0000 mg | ORAL_TABLET | Freq: Every day | ORAL | Status: DC
  Administered 2015-11-22 – 2015-11-23 (×2): 20 mg via ORAL
  Filled 2015-11-22 (×2): qty 1

## 2015-11-22 MED ORDER — POTASSIUM CHLORIDE CRYS ER 20 MEQ PO TBCR
40.0000 meq | EXTENDED_RELEASE_TABLET | Freq: Two times a day (BID) | ORAL | Status: AC
  Administered 2015-11-22 (×2): 40 meq via ORAL
  Filled 2015-11-22 (×2): qty 2

## 2015-11-22 MED ORDER — PIPERACILLIN-TAZOBACTAM 3.375 G IVPB
3.3750 g | Freq: Three times a day (TID) | INTRAVENOUS | Status: DC
  Administered 2015-11-22 – 2015-11-23 (×4): 3.375 g via INTRAVENOUS
  Filled 2015-11-22 (×5): qty 50

## 2015-11-22 NOTE — Evaluation (Signed)
Objective Swallowing Evaluation: MBS-Modified Barium Swallow Study  Patient Details  Name: Cheryl [name] MRN: [id number] Date of Birth: December 12, 1950  Today's Date: 11/22/2015 Time: SLP Start Time (ACUTE ONLY): 1030-SLP Stop Time (ACUTE ONLY): 1115 SLP Time Calculation (min) (ACUTE ONLY): 45 min  Past Medical History:  Past Medical History  Diagnosis Date  . COPD (chronic obstructive pulmonary disease) (HCC)   . Hypertension   . Arthritis   . Hyperlipidemia   . GERD (gastroesophageal reflux disease)   . Neuropathy (HCC) 2010  . Oxygen deficiency   . Coronary artery disease   . Headache   . Anxiety   . Peripheral vascular disease (HCC)   . Vitamin D deficiency   . Migraines   . Chronic pain   . DVT (deep venous thrombosis) (HCC)   . Pneumonia   . Osteoporosis   . Allergy   . Asthma    Past Surgical History:  Past Surgical History  Procedure Laterality Date  . Appendectomy    . Spine surgery    . Foot surgery Bilateral     5-6 years per patient  . Cardiac catherization  10/31/2009  . Abdomnal aortic stent  05/30/2008    Dr. Nanetta Batty  . Abdominal hysterectomy  1975    Bilaterl Oophorectomy; Dur to IUD infection  . Cholecystectomy  1972  . Cervical fusion  C5 - 6/C6-7  . Appendectomy    . Colonoscopy with propofol N/A 07/27/2015    Procedure: COLONOSCOPY WITH PROPOFOL;  Surgeon: Wallace Cullens, MD;  Location: [facility] ENDOSCOPY;  Service: Gastroenterology;  Laterality: N/A;  . Esophagogastroduodenoscopy (egd) with propofol N/A 07/27/2015    Procedure: ESOPHAGOGASTRODUODENOSCOPY (EGD) WITH PROPOFOL;  Surgeon: Wallace Cullens, MD;  Location: [facility] ENDOSCOPY;  Service: Gastroenterology;  Laterality: N/A;   HPI: Cheryl [name] is a 64 y.o. female with a known history of recent pneumonia, COPD on oxygen 2 L at home when necessary, hypertension and CAD. The patient was sent to ED from nursing home due to fever 102, shortness of breath and hypoxia. She was found hypotensive and hypoxia in the  ED. She was put on BiPAP and given normal saline bolus. Her chest x-ray show worsening right-sided pneumonia, so she was treated with antibiotics in the ED. The patient was just discharged recently after treatment of pneumonia, on Levaquin by mouth. She is not good historian, only complains of weakness. MD requested MBSS to rule out aspiration.  Subjective: Pt was agreeable to MBSS.  Subjective:  Chief complaint: Pt denied any swallowing problems.   Objective:  Radiological Procedure: A videoflouroscopic evaluation of oral-preparatory, reflex initiation, and pharyngeal phases of the swallow was performed; as well as a screening of the upper esophageal phase.  I. POSTURE: Upright in flouro chair II. VIEW: Lateral III. COMPENSATORY STRATEGIES: None attempted  IV. BOLUSES ADMINISTERED:  Thin Liquid: x2  Nectar-thick Liquid: x3  Honey-thick Liquid: N/A  Puree: x2  Mechanical Soft: Pt did not have dentures in V. RESULTS OF EVALUATION: A. ORAL PREPARATORY PHASE: (The lips, tongue, and velum are observed for strength and coordination) Unable to assess solids during MBS because pt did not have dentures in. Gave pt solids at bedside and she was able to masticate and clear small bites of solid. No pocketing or holding observed. Some mild anterior leakage of nectar by cup was observed, however no spillage with nectar by straw.        **Overall Severity Rating: Within functional limits for tested consistencies.   B. Tenna Child  INITIATION/REFLEX: (The reflex is normal if "triggered" by the time the bolus reached the base of the tongue) Swallow triggered at the valleculae for all tested consistencies except thin. Triggered while spilling into pyriforms with thin liquids.  **Overall Severity Rating: Moderate swallow delay  C. PHARYNGEAL PHASE: (Pharyngeal function is normal if the bolus shows rapid, smooth, and continuous transit through the pharynx and there is no pharyngeal residue after the  swallow)  **Overall Severity Rating: [facility] for all tested consistencies.  D. LARYNGEAL PENETRATION: (Material entering into the laryngeal inlet/vestibule but not aspirated)  Observed x2 with thin liquids to the level of vocal cords with silent response. E. ASPIRATION: Not observed, however likely with depth of laryngeal penetration of thin F. ESOPHAGEAL PHASE: (Screening of the upper esophagus) No abnormalities observed  Assessment / Plan / Recommendation  CHL IP CLINICAL IMPRESSIONS 11/22/2015  Therapy Diagnosis Moderate pharyngeal phase dysphagia  Clinical Impression Pt presents w/moderate pharyngeal dysphagia and mild oral dysphagia and moderate risk of aspiration. Oral phase was [facility] for all tested consistency except solid. Pt did not have dentures during MBSS and solid could not be assessed during study. Pt demonstrated moderate swallow delay with thin liquids triggering swallow while spilling into pyriform sinuses. This swallow delay resulted in deep laryngeal penetration to the level of the vocal cords with thin liquids. This penetration was silent and was not observed to clear by ST. All other tested consistencies triggered at the level of the valleculae. No aspiration or laryngeal penetration was observed with any other tested consistency. No significant pharyngeal residue was observed with any tested consistency. Pt was able to swallow BA pill whole in puree. *Following study pt was given a trial of solid at bedside with her dentures in place. Pt was able to masticate and clear very small bites of solid consistency. Recommend Dysphagia II diet w/ nectar thick liquids and aspiration precautions.   Impact on safety and function Moderate aspiration risk      CHL IP TREATMENT RECOMMENDATION 11/22/2015  Treatment Recommendations Therapy as outlined in treatment plan below     Prognosis 11/22/2015  Prognosis for Safe Diet Advancement Fair  Barriers to Reach Goals Cognitive deficits   Barriers/Prognosis Comment --    CHL IP DIET RECOMMENDATION 11/22/2015  SLP Diet Recommendations Dysphagia 2 (Fine chop) solids;Nectar thick liquid  Liquid Administration via Cup;Straw  Medication Administration Whole meds with puree  Compensations Minimize environmental distractions;Slow rate;Small sips/bites  Postural Changes Remain semi-upright after after feeds/meals (Comment);Seated upright at 90 degrees      CHL IP OTHER RECOMMENDATIONS 11/22/2015  Recommended Consults --  Oral Care Recommendations Oral care BID;Staff/trained caregiver to provide oral care  Other Recommendations --      CHL IP FOLLOW UP RECOMMENDATIONS 11/22/2015  Follow up Recommendations Skilled Nursing facility      [facility] D[facility] 11/22/2015  Speech Therapy Frequency (ACUTE ONLY) min 3x week  Treatment Duration 1 week           No flowsheet data found.  No flowsheet data found.   No flowsheet data found.   [place] 11/22/2015, 1:42 PM

## 2015-11-22 NOTE — Progress Notes (Addendum)
[facility] [place] [facility]   PATIENT NAME: Cheryl [name]    MR#:  [id number]  DATE OF BIRTH:  25-Nov-1951  SUBJECTIVE:  CHIEF COMPLAINT:   Chief Complaint  Patient presents with  . Code Sepsis   Still Cough, SOB and drowsy,  on O2NC 2L. . Weak and drowsy today in the morning. Images were performed. Unremarkable. , , remains on high flow oxygen at 5 L with O2 sats of 91-92% . Chest x-ray has improved , concerning for CHF . While blood cell count is improving . Ammonia level is less than 9  REVIEW OF SYSTEMS:  CONSTITUTIONAL: No fever, has weakness.  EYES: No blurred or double vision.  EARS, NOSE, AND THROAT: No tinnitus or ear pain.  RESPIRATORY: has cough, shortness of breath,no  wheezing or hemoptysis.  CARDIOVASCULAR: No chest pain, orthopnea, edema.  GASTROINTESTINAL: No nausea, vomiting, diarrhea or abdominal pain.  GENITOURINARY: No dysuria, hematuria.  ENDOCRINE: No polyuria, nocturia,  HEMATOLOGY: No anemia, easy bruising or bleeding SKIN: No rash or lesion. MUSCULOSKELETAL: No joint pain or arthritis.   NEUROLOGIC: No tingling, numbness, weakness.  PSYCHIATRY: No anxiety or depression.   DRUG ALLERGIES:   Allergies  Allergen Reactions  . Percocet [Oxycodone-Acetaminophen] Hives  . Aspirin Nausea And Vomiting and Other (See Comments)    Reaction:  GI upset   . Codeine Nausea And Vomiting and Other (See Comments)    Reaction:  GI upset   . Morphine Other (See Comments)    Reaction:  Unknown   . Pregabalin Other (See Comments)    Reaction:  Makes pt sleep excessively.    . Propoxyphene Other (See Comments)    Reaction:  GI upset   . Sulfa Antibiotics Rash and Other (See Comments)    Reaction:  GI upset     VITALS:  Blood pressure 129/60, pulse 86, temperature 98.9 F (37.2 C), temperature source Oral, resp. rate 20, height 5\' 1"  (1.549 m), weight 45.813 kg (101 lb), SpO2 91 %.  PHYSICAL EXAMINATION:  GENERAL:  64 y.o.-year-old  patient lying in the bed with lethargy. EYES: Pupils equal, round, reactive to light and accommodation. No scleral icterus. Extraocular muscles intact.  HEENT: Head atraumatic, normocephalic. Oropharynx and nasopharynx clear.  NECK:  Supple, no jugular venous distention. No thyroid enlargement, no tenderness.  LUNGS: Normal breath sounds bilaterally, no wheezing, b/l crackles. No use of accessory muscles of respiration.  CARDIOVASCULAR: S1, S2 normal. No murmurs, rubs, or gallops.  ABDOMEN: Soft, nontender, nondistended. Bowel sounds present. No organomegaly or mass.  EXTREMITIES: No pedal edema, cyanosis, or clubbing.  NEUROLOGIC: Cranial nerves II through XII are intact. Muscle strength 3-4/5 in all extremities. Sensation intact. Gait not checked.  PSYCHIATRIC: The patient is alert and oriented x 3.  SKIN: No obvious rash, lesion, or ulcer.    LABORATORY PANEL:   CBC  Recent Labs Lab 11/22/15 0550  WBC 17.3*  HGB 7.6*  HCT 23.0*  PLT 304   ------------------------------------------------------------------------------------------------------------------  Chemistries   Recent Labs Lab 11/19/15 0853  11/22/15 0550  NA 138  < > 136  K 4.0  < > 3.2*  CL 103  < > 100*  CO2 30  < > 27  GLUCOSE 95  < > 78  BUN 18  < > 12  CREATININE 1.28*  < > 0.65  CALCIUM 8.1*  < > 8.2*  AST 23  --   --   ALT 9*  --   --  ALKPHOS 95  --   --   BILITOT 0.5  --   --   < > = values in this interval not displayed. ------------------------------------------------------------------------------------------------------------------  Cardiac Enzymes  Recent Labs Lab 11/19/15 0853  TROPONINI <0.03   ------------------------------------------------------------------------------------------------------------------  RADIOLOGY:  Dg Chest 2 View  11/22/2015  CLINICAL DATA:  Pneumonia.  COPD. EXAM: CHEST  2 VIEW COMPARISON:  11/19/2015 FINDINGS: Improved aeration in the right lower lung. There  are new interstitial and airspace densities in the upper lungs bilaterally. Multiple linear densities in left lower lung could represent areas of atelectasis but cannot exclude Kerley B-lines. Heart size is slightly prominent but unchanged. Question tiny pleural effusions. Trachea is midline. IMPRESSION: The airspace disease in the right lower lung has markedly improved but there are new interstitial and airspace densities throughout the bilateral upper lungs. Bilateral lung findings may represent atypical pneumonia versus pulmonary edema. Suspect tiny pleural effusions. Electronically Signed   By: Richarda Overlie M.D.   On: 11/22/2015 07:47    EKG:   Orders placed or performed during the hospital encounter of 11/19/15  . ED EKG 12-Lead  . ED EKG 12-Lead    ASSESSMENT AND PLAN:    Acute respiratory failure with hypoxia , likely due to underlying COPD, pneumonia and possibly fluid overload   continue oxygen  by nasal cannula, Off BiPAP, continue Xopenex when necessary, continue Spiriva and dulera.Appreciate pulmonology consultation    hypotension with lactic acidosis, concerning for Septic shock. Hypotension improved.  Leukocytosis is improving. The patient was treated with normal saline bolus 2 L in the ED. discontinued normal saline IV,  continue cefepime and vancomycin, follow-up CBC, blood culture is negative so far. Resuming home Lasix, at lower dose.follow blood pressure readings very close   Pneumonia HAP. She was treated with antibiotics in the ED.I will continue cefepime and vancomycin, follow-up CBC,  negative blood culture and sputum culture so far. MRSA negative .   COPD. continue nebulizer treatment.  Anemia of chronic disease. stable.     generalized weakness , Start PT.  All the records are reviewed and case discussed with Care Management/Social Workerr. Management plans discussed with the patient, family and they are in agreement.  CODE STATUS: Full code  TOTAL TIME  TAKING CARE OF THIS PATIENT: 60 minutes.  . Prolonged discussion this patient's husband about patient's condition, treatment plan and discharge planning, questions were answered, voiced understanding. Time spent 20 minutes for discussion  POSSIBLE D/C IN 3 DAYS, DEPENDING ON CLINICAL CONDITION.   Katharina Caper M.D on 11/22/2015 at 2:56 PM  Between 7am to 6pm - Pager - [phone]  After 6pm go to [email or web address] - password [email or web address] [facility]  [facility]  Office  [phone]  CC: Primary care physician; Mila Merry, MD

## 2015-11-22 NOTE — Progress Notes (Addendum)
Patient lethargic this am. No c/o pain, or nausea. She stated "I don't feel well." MD rounding. Ordered ABG and barium swallow eval. Patient tolerated meds well, whole with applesauce. Updated MD with current vitals. Will monitor.

## 2015-11-22 NOTE — Plan of Care (Addendum)
Problem: Respiratory: Goal: Ability to maintain adequate ventilation will improve Outcome: Progressing Patient SOB with exertion. Increased O2 to 4L. ABGs done this am. MD aware of results. WBCs trending down. Diet changed from regular/thins to dysphagia 2/nectar thick. Plan to d/c to SNF at discharge.

## 2015-11-22 NOTE — Plan of Care (Signed)
Problem: Physical Regulation: Goal: Diagnostic test results will improve Outcome: Progressing Pt continues on oxygen waiting on ECHO to be complete Goal: Signs and symptoms of infection will decrease Outcome: Not Progressing Started on zosyn

## 2015-11-22 NOTE — Progress Notes (Signed)
Pt much more alert, husband at the bedside. Notified MD. VSS. Swallow eval and ECHO pending.

## 2015-11-22 NOTE — Progress Notes (Signed)
[facility]* [facility]     Assessment and Plan:  IMPRESSION:  Underlying COPD/emphysema - appears stable at this time, not in exacerbation.  RLL HCAP  Recurrent hospitalizations (3) for PNA over past 6 wks Prior hospitalizations for AMS - this raises concern for recurrent aspiration She is on chronic opioids (Duragesic). This might be contributing to AMS and risk of aspiration  PLAN/REC:  COPD regimen reviewed - continue dulera, spiriva, albuterol.  Continue supplemental O2 to maintain SpO2 > 90 % Agree with current abx for HCAP Narrow as able depending on culture results Would complete 10-14 days from current admission date If empiric therapy (ie. No culture data to guide us) would consider Augmentin +/- levofloxacin  PCT in AM 12/27>> results pending.  Echo results pending.     Date: 11/22/2015  MRN# [id number] Sueanne MargaritaMary George Dante 06-20-1951   Leanza George Winnifred FriarRobey is a 64 y.o. old female seen in follow up for chief complaint of  Chief Complaint  Patient presents with  . Code Sepsis     HPI:  The patient has no new complaints today, she feels that her breathing is better than yesterday.   Medication:   Reviewed.   Allergies:  Percocet; Aspirin; Codeine; Morphine; Pregabalin; Propoxyphene; and Sulfa antibiotics  Review of Systems: Gen:  Denies  fever, sweats. HEENT: Denies blurred vision. Cvc:  No dizziness, chest pain or heaviness Resp:   Denies cough or sputum porduction. Gi: Denies  bowel incontinence Gu:  Denies bladder incontinence, burning urine Ext:   No Joint pain, stiffness. Skin: No skin rash, easy bruising. Endoc:  No polyuria, polydipsia. Psych: No depression, insomnia. Other:  All other systems were reviewed and found to be negative other than what is mentioned in the HPI.   Physical Examination:   VS: BP 129/60 mmHg  Pulse 86  Temp(Src) 98.9 F (37.2 C) (Oral)  Resp 20  Ht 5\' 1"   (1.549 m)  Wt 101 lb (45.813 kg)  BMI 19.09 kg/m2  SpO2 91%  General Appearance: No distress  Neuro:without focal findings,  speech normal,  HEENT: PERRLA, EOM intact. Pulmonary: Decreased air entry bilaterally.   CardiovascularNormal S1,S2.  No m/r/g.   Abdomen: Benign, Soft, non-tender. Renal:  No costovertebral tenderness  GU:  Not performed at this time. Endoc: No evident thyromegaly, no signs of acromegaly. Skin:   warm, no rash. Extremities: normal, no cyanosis, clubbing.   LABORATORY PANEL:   CBC  Recent Labs Lab 11/22/15 0550  WBC 17.3*  HGB 7.6*  HCT 23.0*  PLT 304   ------------------------------------------------------------------------------------------------------------------  Chemistries   Recent Labs Lab 11/19/15 0853  11/22/15 0550  NA 138  < > 136  K 4.0  < > 3.2*  CL 103  < > 100*  CO2 30  < > 27  GLUCOSE 95  < > 78  BUN 18  < > 12  CREATININE 1.28*  < > 0.65  CALCIUM 8.1*  < > 8.2*  AST 23  --   --   ALT 9*  --   --   ALKPHOS 95  --   --   BILITOT 0.5  --   --   < > = values in this interval not displayed. ------------------------------------------------------------------------------------------------------------------  Cardiac Enzymes  Recent Labs Lab 11/19/15 0853  TROPONINI <0.03   ------------------------------------------------------------  RADIOLOGY:   No results found for this or any previous visit. Results for orders placed during the hospital encounter of 11/19/15  [facility]  Narrative CLINICAL DATA:  Pneumonia.  COPD.  EXAM: CHEST  2 VIEW  COMPARISON:  11/19/2015  FINDINGS: Improved aeration in the right lower lung. There are new interstitial and airspace densities in the upper lungs bilaterally. Multiple linear densities in left lower lung could represent areas of atelectasis but cannot exclude Kerley B-lines. Heart size is slightly prominent but unchanged. Question tiny pleural effusions. Trachea is  midline.  IMPRESSION: The airspace disease in the right lower lung has markedly improved but there are new interstitial and airspace densities throughout the bilateral upper lungs. Bilateral lung findings may represent atypical pneumonia versus pulmonary edema.  Suspect tiny pleural effusions.   Electronically Signed   By: Richarda Overlie M.D.   On: 11/22/2015 07:47    ------------------------------------------------------------------------------------------------------------------  Thank  you for allowing [facility] to assist in the care of your patient. Our recommendations are noted above.  Please contact us if we can be of further service.   Wells Guiles, MD.  [facility]   Santiago Glad, M.D.  Stephanie Acre, M.D.  Billy Fischer, M.D

## 2015-11-22 NOTE — Plan of Care (Signed)
Problem: Physical Regulation: Goal: Signs and symptoms of infection will decrease Outcome: Progressing Pts WBC is trending down.  Continued on IV abx.  Afebrile.  Problem: Respiratory: Goal: Ability to maintain adequate ventilation will improve Outcome: Progressing Pt continues on inhalers and 2L O2 and PRN duonebs.

## 2015-11-22 NOTE — Progress Notes (Signed)
Physical Therapy Treatment Patient Details Name: Cheryl [name] MRN: [id number] DOB: 09-Jan-1951 Today's Date: 11/22/2015    History of Present Illness Patient has recently been discharged from [facility], and now returns to [facility] stay after admission on 11/09/2015. Presents with fever and shortness of breath as well as progressive weakness.     PT Comments    Pt denies pain; complains of significant weakness. O2 saturation initially in low 90's on 2 liters. Initial attempts to sit; pt gives little effort and is limp/ragdoll like. Pt conscious and moaning with no other complaints than weakness. Pt requests help, but is able to demonstrate improved ability to sit upright and scoot to edge of bed.O2 saturation improves to 92% with pursed lip breathing and encouraging upright trunk posture. Pt performs 2 sit to stands with static stand tolerance less than 30 seconds each. O2 saturation decreases to 85% on first stand and 88 on second stand. Contacted nursing to provide incentive spirometer in the room and encouraged use with pt/family. Deferred ambulation due to poor stand tolerance. Pt wished return to bed; family in room. Continue PT for progression of strength, endurance and all functional mobility with safe O2 saturation levels.    Follow Up Recommendations  SNF     Equipment Recommendations  None recommended by PT    Recommendations for Other Services       Precautions / Restrictions Precautions Precautions: Fall Restrictions Weight Bearing Restrictions: No    Mobility  Bed Mobility Overal bed mobility: Needs Assistance Bed Mobility: Supine to Sit;Sit to Supine     Supine to sit: HOB elevated;Min assist (Gives little effort; sits slumped over; request help) Sit to supine: Modified independent (Device/Increase time) (Max A x 2 to reposition upward in bed)   General bed mobility comments: Pt gives little effort to sit; stay slumped over forward and to the right; increased  cueing/encouragement to get pt to initiate movement to edge of bed and sit with more upright posture, but able to demonstrate eventually  Transfers Overall transfer level: Needs assistance Equipment used: Rolling walker (2 wheeled) Transfers: Sit to/from Stand Sit to Stand: Min guard         General transfer comment: cues for safe hand placement 100% of the time  Ambulation/Gait             General Gait Details: deferred ambulation due to poor stand tolerance   Stairs            Wheelchair Mobility    Modified Rankin (Stroke Patients Only)       Balance Overall balance assessment: Needs assistance Sitting-balance support: Bilateral upper extremity supported;Feet supported Sitting balance-Leahy Scale: Fair Sitting balance - Comments: initially poor falling forward; with encouragement, pt demonstrates improved ability to tolerate sitting Postural control: Right lateral lean;Other (comment) (forward slump) Standing balance support: Bilateral upper extremity supported Standing balance-Leahy Scale: Fair Standing balance comment: No balance deficits with static stand at rw, but little tolerance/endurance and desaturation to 85% on 2 L                    Cognition Arousal/Alertness: Lethargic Behavior During Therapy: [facility] for tasks assessed/performed;Restless Overall Cognitive Status: Within Functional Limits for tasks assessed                      Exercises      General Comments        Pertinent Vitals/Pain Pain Assessment: No/denies pain    Home  Living                      Prior Function            PT Goals (current goals can now be found in the care plan section) Progress towards PT goals: Progressing toward goals (slowly)    Frequency  Min 2X/week    PT Plan Current plan remains appropriate    Co-evaluation             End of Session Equipment Utilized During Treatment: Gait belt;Oxygen Activity Tolerance:  Patient limited by fatigue;Patient limited by lethargy;Other (comment) (limited by weakness and O2 saturation) Patient left: in bed;with call bell/phone within reach;with bed alarm set;with family/visitor present     Time: [id number] PT Time Calculation (min) (ACUTE ONLY): 15 min  Charges:  $Therapeutic Activity: 8-22 mins                    G Codes:      Cheryl [name] 11/22/2015, 4:50 PM

## 2015-11-23 LAB — CBC
HCT: 24.9 % — ABNORMAL LOW (ref 35.0–47.0)
Hemoglobin: 8.3 g/dL — ABNORMAL LOW (ref 12.0–16.0)
MCH: 29.7 pg (ref 26.0–34.0)
MCHC: 33.5 g/dL (ref 32.0–36.0)
MCV: 88.7 fL (ref 80.0–100.0)
PLATELETS: 355 10*3/uL (ref 150–440)
RBC: 2.81 MIL/uL — AB (ref 3.80–5.20)
RDW: 15.1 % — ABNORMAL HIGH (ref 11.5–14.5)
WBC: 8.3 10*3/uL (ref 3.6–11.0)

## 2015-11-23 LAB — BASIC METABOLIC PANEL
ANION GAP: 6 (ref 5–15)
BUN: 12 mg/dL (ref 6–20)
CALCIUM: 8.4 mg/dL — AB (ref 8.9–10.3)
CHLORIDE: 102 mmol/L (ref 101–111)
CO2: 30 mmol/L (ref 22–32)
Creatinine, Ser: 0.67 mg/dL (ref 0.44–1.00)
GFR calc non Af Amer: >60 mL/min (ref >60)
Glucose, Bld: 102 mg/dL — ABNORMAL HIGH (ref 65–99)
Potassium: 3.7 mmol/L (ref 3.5–5.1)
SODIUM: 138 mmol/L (ref 135–145)

## 2015-11-23 MED ORDER — LEVOFLOXACIN IN D5W 750 MG/150ML IV SOLN
750.0000 mg | INTRAVENOUS | Status: DC
  Administered 2015-11-23: 750 mg via INTRAVENOUS
  Filled 2015-11-23 (×2): qty 150

## 2015-11-23 MED ORDER — DRONABINOL 2.5 MG PO CAPS
2.5000 mg | ORAL_CAPSULE | Freq: Two times a day (BID) | ORAL | Status: DC
  Administered 2015-11-23 – 2015-11-24 (×3): 2.5 mg via ORAL
  Filled 2015-11-23 (×3): qty 1

## 2015-11-23 NOTE — Plan of Care (Signed)
Problem: Physical Regulation: Goal: Signs and symptoms of infection will decrease Outcome: Progressing Pt continues on IV antibiotics.  Afebrile.  WBC trending down.  Problem: Respiratory: Goal: Ability to maintain adequate ventilation will improve Outcome: Progressing Pt continues on O2.  Saturations at 93% on 4L. PRN duonebs available if needed.

## 2015-11-23 NOTE — Progress Notes (Addendum)
Speech Language Pathology Treatment: Dysphagia  Patient Details Name: Cheryl [name] MRN: [id number] DOB: [birth year] Today's Date: 11/23/2015 Time: 1000-1030 SLP Time Calculation (min) (ACUTE ONLY): 30 min  Assessment / Plan / Recommendation Clinical Impression  Pt noted to have breakfast tray at bedside in room. Per nsg and RD pt has been refusing all nectar liquids. Pt stated she will not drink the nectar liquids and that her husband brings in milkshakes from home. Discussed importance of drinking liquids with pt but she adamantly refused to attempt any. Pt willing to eat banana from tray and requested orange sherbet ice cream. Pt appeared to tolerate banana as well as ice cream with no immediate overt s/s of aspiration. Pt was noted to have one delayed cough following ice cream and ice cream consistency is more like thin liquid. Pt appears to tolerate Dys 2 diet although pt only willing to eat a little from tray. Unable to judge toleration of nectar thick liquids d/t pt refusal. Will continue to follow pt. Will discuss with MD pt's refusal of liquids.    HPI HPI: Cheryl DimmerMary Essick is a 64 y.o. female with a known history of recent pneumonia, COPD on oxygen 2 L at home when necessary, hypertension and CAD. The patient was sent to ED from nursing home due to fever 102, shortness of breath and hypoxia. She was found hypotensive and hypoxia in the ED. She was put on BiPAP and given normal saline bolus. Her chest x-ray show worsening right-sided pneumonia, so she was treated with antibiotics in the ED. The patient was just discharged recently after treatment of pneumonia, on Levaquin by mouth. She is not good historian, only complains of weakness. MD requested MBSS to rule out aspiration.      SLP Plan  Continue with current plan of care     Recommendations  Diet recommendations: Dysphagia 2 (fine chop);Nectar-thick liquid Liquids provided via: Cup;Straw Supervision: Patient able to self  feed Compensations: Minimize environmental distractions;Slow rate;Small sips/bites Postural Changes and/or Swallow Maneuvers: Seated upright 90 degrees;Upright 30-60 min after meal              Oral Care Recommendations: Oral care BID;Patient independent with oral care Follow up Recommendations: Skilled Nursing facility Plan: Continue with current plan of care   ,Keenen Roessner 11/23/2015, 10:40 AM  Addendum: Spoke w/ Dr. Winona LegatoVaickute about pt's refusal to drink thickened liquids. Per Dr. Winona LegatoVaickute will upgrade diet to Dysphagia 3 w/thin liquids and will monitor pt.

## 2015-11-23 NOTE — Plan of Care (Addendum)
Problem: Physical Regulation: Goal: Diagnostic test results will improve Outcome: Progressing Patient worked with PT today. Sat up in chair for most of afternoon. Goal: Signs and symptoms of infection will decrease Outcome: Progressing WBC in normal range. Afebrile. Remains on IV ABX.  Changed ABX to levaquin today.  Problem: Respiratory: Goal: Ability to maintain adequate ventilation will improve Outcome: Progressing Patient remains SOB with exertion. On O2 3-4L today.  Diet changed to dysphagia 3, thin liquids.

## 2015-11-23 NOTE — Progress Notes (Signed)
Physical Therapy Treatment Patient Details Name: Cheryl [name] Reek MRN: [id number] DOB: 1951/03/08 Today's Date: 11/23/2015    History of Present Illness Patient has recently been discharged from [facility], and [facility]. Presents with fever and shortness of breath as well as progressive weakness.     PT Comments    Pt reports feeling a little better this morning, but continues to note feeling very weak. Pt agreeable to up in the chair this morning. Pt on 4 Liters O2; O2 saturation remain 94-95% with activity/ambulation. Pt initially performs supine to sit with greater participation not needing physical assist; however, pt pushes self up forcefully overcompensating and requiring Min A to avoid sliding off the edge of the bed. Once steady at edge of bed; pt able to maintain safe sitting position with cues for improving upright posture. Pt demonstrates improved tolerance of stand and ability to ambulate 20 feet today before receiving up in chair. Obtained incentive spirometer for pt today, as she did not receive yesterday as requested. Encouraged use every hour for 10 repetitions. Pt/spouse understand. Encouraged pt/family to remain up in chair for several hours as tolerated. Continue PT to progress strength, endurance, transfers, safety and ambulation with safe O2 saturation levels.   Follow Up Recommendations  SNF     Equipment Recommendations  None recommended by PT    Recommendations for Other Services       Precautions / Restrictions Precautions Precautions: Fall Restrictions Weight Bearing Restrictions: No    Mobility  Bed Mobility Overal bed mobility: Needs Assistance Bed Mobility: Supine to Sit     Supine to sit: HOB elevated;Min assist (to prevent sliding off edge of bed)     General bed mobility comments: Pt pushes self from rail to edge of bed overcompensating and requires assist to prevent sliding off edge of  bed.  Transfers Overall transfer level: Needs assistance Equipment used: Rolling walker (2 wheeled) Transfers: Sit to/from Stand Sit to Stand: Min guard (cues for proper hand placement)         General transfer comment: improved tolerance for stand/upright posture  Ambulation/Gait Ambulation/Gait assistance: Min guard Ambulation Distance (Feet): 20 Feet Assistive device: Rolling walker (2 wheeled) Gait Pattern/deviations: Step-through pattern;Decreased dorsiflexion - right;Decreased dorsiflexion - left;Decreased stride length Gait velocity: reduced Gait velocity interpretation: Below normal speed for age/gender General Gait Details: moans complains of weakness with ambulation, No LOB. O2 saturation 94-95% on 4L O2   Stairs            Wheelchair Mobility    Modified Rankin (Stroke Patients Only)       Balance Overall balance assessment: Needs assistance Sitting-balance support: Bilateral upper extremity supported;Feet supported Sitting balance-Leahy Scale: Fair Sitting balance - Comments: once steady at edge of bed; able to sit with self support; cues for improved upright posture Postural control: Other (comment) (forward flexed) Standing balance support: Bilateral upper extremity supported Standing balance-Leahy Scale: Fair Standing balance comment: O2 saturation remains 94-95% on 4 L with stand/ambulation                    Cognition Arousal/Alertness: Awake/alert Behavior During Therapy: WFL for tasks assessed/performed;Restless Overall Cognitive Status: Within Functional Limits for tasks assessed                      Exercises General Exercises - Lower Extremity Long Arc Quad: AROM;Both;20 reps;Seated Hip Flexion/Marching: AROM;Both;20 reps;Seated Toe Raises: AROM;Both;20 reps;Seated Heel Raises:  AROM;Both;20 reps;Seated    General Comments        Pertinent Vitals/Pain Pain Assessment: No/denies pain    Home Living                       Prior Function            PT Goals (current goals can now be found in the care plan section) Progress towards PT goals: Progressing toward goals    Frequency  Min 2X/week    PT Plan Current plan remains appropriate    Co-evaluation             End of Session Equipment Utilized During Treatment: Gait belt;Oxygen Activity Tolerance: Patient limited by fatigue;Patient tolerated treatment well Patient left: in chair;with chair alarm set;with family/visitor present;with call bell/phone within reach     Time: 1610-9604 PT Time Calculation (min) (ACUTE ONLY): 23 min  Charges:  $Gait Training: 8-22 mins $Therapeutic Exercise: 8-22 mins                    G Codes:      Kristeen Miss 11/23/2015, 11:35 AM

## 2015-11-23 NOTE — Progress Notes (Signed)
[facility] - [place] at [facility]   PATIENT NAME: Cheryl [name]    MR#:  [id number]  DATE OF BIRTH:  1951/07/25  SUBJECTIVE:  CHIEF COMPLAINT:   Chief Complaint  Patient presents with  . Code Sepsis   Still Cough, SOB and drowsy,  on O2NC 4 L. today, down from 5 L yesterday . Just as he has improved, concerning for congestive heart failure, improving with Lasix. Feels comfortable  REVIEW OF SYSTEMS:  CONSTITUTIONAL: No fever, has weakness.  EYES: No blurred or double vision.  EARS, NOSE, AND THROAT: No tinnitus or ear pain.  RESPIRATORY: has cough, shortness of breath,no  wheezing or hemoptysis.  CARDIOVASCULAR: No chest pain, orthopnea, edema.  GASTROINTESTINAL: No nausea, vomiting, diarrhea or abdominal pain.  GENITOURINARY: No dysuria, hematuria.  ENDOCRINE: No polyuria, nocturia,  HEMATOLOGY: No anemia, easy bruising or bleeding SKIN: No rash or lesion. MUSCULOSKELETAL: No joint pain or arthritis.   NEUROLOGIC: No tingling, numbness, weakness.  PSYCHIATRY: No anxiety or depression.   DRUG ALLERGIES:   Allergies  Allergen Reactions  . Percocet [Oxycodone-Acetaminophen] Hives  . Aspirin Nausea And Vomiting and Other (See Comments)    Reaction:  GI upset   . Codeine Nausea And Vomiting and Other (See Comments)    Reaction:  GI upset   . Morphine Other (See Comments)    Reaction:  Unknown   . Pregabalin Other (See Comments)    Reaction:  Makes pt sleep excessively.    . Propoxyphene Other (See Comments)    Reaction:  GI upset   . Sulfa Antibiotics Rash and Other (See Comments)    Reaction:  GI upset     VITALS:  Blood pressure 100/51, pulse 83, temperature 97.7 F (36.5 C), temperature source Oral, resp. rate 21, height  (1.549 m), weight 45.813 kg (101 lb), SpO2 96 %.  PHYSICAL EXAMINATION:  GENERAL:  64 y.o.-year-old patient sitting on the chair, weak and somewhat lethargic EYES: Pupils equal, round, reactive to light and  accommodation. No scleral icterus. Extraocular muscles intact.  HEENT: Head atraumatic, normocephalic. Oropharynx and nasopharynx clear.  NECK:  Supple, no jugular venous distention. No thyroid enlargement, no tenderness.  LUNGS: Normal breath sounds bilaterally, no wheezing, few rhonchi bilaterally, scattered. No use of accessory muscles of respiration.  CARDIOVASCULAR: S1, S2 normal. No murmurs, rubs, or gallops.  ABDOMEN: Soft, nontender, nondistended. Bowel sounds present. No organomegaly or mass.  EXTREMITIES: No pedal edema, cyanosis, or clubbing.  NEUROLOGIC: Cranial nerves II through XII are intact. Muscle strength 3-4/5 in all extremities. Sensation intact. Gait not checked.  PSYCHIATRIC: The patient is alert and oriented x 3.  SKIN: No obvious rash, lesion, or ulcer.    LABORATORY PANEL:   CBC  Recent Labs Lab 11/23/15 0725  WBC 8.3  HGB 8.3*  HCT 24.9*  PLT 355   ------------------------------------------------------------------------------------------------------------------  Chemistries   Recent Labs Lab 11/19/15 0853  11/23/15 0725  NA 138  < > 138  K 4.0  < > 3.7  CL 103  < > 102  CO2 30  < > 30  GLUCOSE 95  < > 102*  BUN 18  < > 12  CREATININE 1.28*  < > 0.67  CALCIUM 8.1*  < > 8.4*  AST 23  --   --   ALT 9*  --   --   ALKPHOS 95  --   --   BILITOT 0.5  --   --   < > = values  in this interval not displayed. ------------------------------------------------------------------------------------------------------------------  Cardiac Enzymes  Recent Labs Lab 11/19/15 0853  TROPONINI <0.03   ------------------------------------------------------------------------------------------------------------------  RADIOLOGY:  Dg Chest 2 View  11/22/2015  CLINICAL DATA:  Pneumonia.  COPD. EXAM: CHEST  2 VIEW COMPARISON:  11/19/2015 FINDINGS: Improved aeration in the right lower lung. There are new interstitial and airspace densities in the upper lungs  bilaterally. Multiple linear densities in left lower lung could represent areas of atelectasis but cannot exclude Kerley B-lines. Heart size is slightly prominent but unchanged. Question tiny pleural effusions. Trachea is midline. IMPRESSION: The airspace disease in the right lower lung has markedly improved but there are new interstitial and airspace densities throughout the bilateral upper lungs. Bilateral lung findings may represent atypical pneumonia versus pulmonary edema. Suspect tiny pleural effusions. Electronically Signed   By: Richarda OverlieAdam  Henn M.D.   On: 11/22/2015 07:47    EKG:   Orders placed or performed during the hospital encounter of 11/19/15  . ED EKG 12-Lead  . ED EKG 12-Lead    ASSESSMENT AND PLAN:    Acute respiratory failure with hypoxia , likely due to underlying COPD, pneumonia and fluid overload / acute on chronic diastolic CHF  continue oxygen  by nasal cannula, Off BiPAP, continue Xopenex when necessary, continue Spiriva and dulera.Appreciate pulmonology consultation. Some improvement, weaning off oxygen    hypotension with lactic acidosis, concerning for Septic shock. Hypotension improved.  Leukocytosis is improving. The patient was treated with normal saline bolus 2 L in the ED. discontinued normal saline IV,  continue cefepime and vancomycin, white blood cell count normalized,  blood cultures are negative so far. Resumed home Lasix, at lower dose.follow blood pressure readings close , relatively hypotensive today  Pneumonia HAP. Continue cefepime and vancomycin, blood cell count is normal,  negative blood culture and sputum culture so far. MRSA negative . Sputum cultures are not obtained  COPD. continue nebulizer treatment.  Anemia of chronic disease. stable.     generalized weakness , Start PT, likely discharge to skilled nursing facility for rehabilitation in the next few days.  All the records are reviewed and case discussed with Care Management/Social  Workerr. Management plans discussed with the patient, family and they are in agreement.  CODE STATUS: Full code  TOTAL TIME TAKING CARE OF THIS PATIENT: 40 minutes.  Discussed with patient's son  POSSIBLE D/C IN 3 DAYS, DEPENDING ON CLINICAL CONDITION.   Katharina CaperVAICKUTE,Tyreka Henneke M.D on 11/23/2015 at 3:43 PM  Between 7am to 6pm - Pager - [phone]  After 6pm go to [email or web address] - password [email or web address] [facility]  [facility]  Office  [phone]  CC: Primary care physician; Mila Merryonald Fisher, MD

## 2015-11-23 NOTE — Care Management Important Message (Signed)
Important Message  Patient Details  Name: Cheryl [name] MRN: [id number] Date of Birth: [birth year]   Medicare Important Message Given:  Yes    Olegario MessierKathy A Jawann Urbani 11/23/2015, 10:26 AM

## 2015-11-24 DIAGNOSIS — D72829 Elevated white blood cell count, unspecified: Secondary | ICD-10-CM

## 2015-11-24 DIAGNOSIS — Z7189 Other specified counseling: Secondary | ICD-10-CM | POA: Diagnosis not present

## 2015-11-24 DIAGNOSIS — J690 Pneumonitis due to inhalation of food and vomit: Secondary | ICD-10-CM

## 2015-11-24 DIAGNOSIS — I2510 Atherosclerotic heart disease of native coronary artery without angina pectoris: Secondary | ICD-10-CM | POA: Diagnosis not present

## 2015-11-24 DIAGNOSIS — M797 Fibromyalgia: Secondary | ICD-10-CM | POA: Diagnosis not present

## 2015-11-24 DIAGNOSIS — G629 Polyneuropathy, unspecified: Secondary | ICD-10-CM | POA: Diagnosis not present

## 2015-11-24 DIAGNOSIS — R110 Nausea: Secondary | ICD-10-CM | POA: Diagnosis not present

## 2015-11-24 DIAGNOSIS — Z79899 Other long term (current) drug therapy: Secondary | ICD-10-CM | POA: Diagnosis not present

## 2015-11-24 DIAGNOSIS — R6521 Severe sepsis with septic shock: Secondary | ICD-10-CM | POA: Diagnosis not present

## 2015-11-24 DIAGNOSIS — K219 Gastro-esophageal reflux disease without esophagitis: Secondary | ICD-10-CM | POA: Diagnosis not present

## 2015-11-24 DIAGNOSIS — E784 Other hyperlipidemia: Secondary | ICD-10-CM | POA: Diagnosis not present

## 2015-11-24 DIAGNOSIS — M6281 Muscle weakness (generalized): Secondary | ICD-10-CM | POA: Diagnosis not present

## 2015-11-24 DIAGNOSIS — J449 Chronic obstructive pulmonary disease, unspecified: Secondary | ICD-10-CM | POA: Diagnosis not present

## 2015-11-24 DIAGNOSIS — E872 Acidosis, unspecified: Secondary | ICD-10-CM

## 2015-11-24 DIAGNOSIS — R279 Unspecified lack of coordination: Secondary | ICD-10-CM | POA: Diagnosis not present

## 2015-11-24 DIAGNOSIS — I959 Hypotension, unspecified: Secondary | ICD-10-CM | POA: Diagnosis not present

## 2015-11-24 DIAGNOSIS — R2681 Unsteadiness on feet: Secondary | ICD-10-CM | POA: Diagnosis not present

## 2015-11-24 DIAGNOSIS — Z5181 Encounter for therapeutic drug level monitoring: Secondary | ICD-10-CM | POA: Diagnosis not present

## 2015-11-24 DIAGNOSIS — F1190 Opioid use, unspecified, uncomplicated: Secondary | ICD-10-CM | POA: Diagnosis not present

## 2015-11-24 DIAGNOSIS — M810 Age-related osteoporosis without current pathological fracture: Secondary | ICD-10-CM | POA: Diagnosis not present

## 2015-11-24 DIAGNOSIS — I10 Essential (primary) hypertension: Secondary | ICD-10-CM | POA: Diagnosis not present

## 2015-11-24 DIAGNOSIS — E785 Hyperlipidemia, unspecified: Secondary | ICD-10-CM | POA: Diagnosis not present

## 2015-11-24 DIAGNOSIS — G609 Hereditary and idiopathic neuropathy, unspecified: Secondary | ICD-10-CM | POA: Diagnosis not present

## 2015-11-24 DIAGNOSIS — R51 Headache: Secondary | ICD-10-CM | POA: Diagnosis not present

## 2015-11-24 DIAGNOSIS — J189 Pneumonia, unspecified organism: Secondary | ICD-10-CM | POA: Diagnosis not present

## 2015-11-24 DIAGNOSIS — J9621 Acute and chronic respiratory failure with hypoxia: Secondary | ICD-10-CM

## 2015-11-24 DIAGNOSIS — D6489 Other specified anemias: Secondary | ICD-10-CM | POA: Diagnosis not present

## 2015-11-24 DIAGNOSIS — M549 Dorsalgia, unspecified: Secondary | ICD-10-CM | POA: Diagnosis not present

## 2015-11-24 DIAGNOSIS — R1312 Dysphagia, oropharyngeal phase: Secondary | ICD-10-CM | POA: Diagnosis not present

## 2015-11-24 DIAGNOSIS — Z86718 Personal history of other venous thrombosis and embolism: Secondary | ICD-10-CM | POA: Diagnosis not present

## 2015-11-24 DIAGNOSIS — E559 Vitamin D deficiency, unspecified: Secondary | ICD-10-CM | POA: Diagnosis not present

## 2015-11-24 DIAGNOSIS — G8929 Other chronic pain: Secondary | ICD-10-CM | POA: Diagnosis not present

## 2015-11-24 DIAGNOSIS — G309 Alzheimer's disease, unspecified: Secondary | ICD-10-CM | POA: Diagnosis not present

## 2015-11-24 DIAGNOSIS — Z9181 History of falling: Secondary | ICD-10-CM | POA: Diagnosis not present

## 2015-11-24 DIAGNOSIS — I739 Peripheral vascular disease, unspecified: Secondary | ICD-10-CM | POA: Diagnosis not present

## 2015-11-24 DIAGNOSIS — G43909 Migraine, unspecified, not intractable, without status migrainosus: Secondary | ICD-10-CM | POA: Diagnosis not present

## 2015-11-24 DIAGNOSIS — R1310 Dysphagia, unspecified: Secondary | ICD-10-CM

## 2015-11-24 DIAGNOSIS — Z720 Tobacco use: Secondary | ICD-10-CM | POA: Diagnosis not present

## 2015-11-24 DIAGNOSIS — F17210 Nicotine dependence, cigarettes, uncomplicated: Secondary | ICD-10-CM | POA: Diagnosis not present

## 2015-11-24 DIAGNOSIS — N390 Urinary tract infection, site not specified: Secondary | ICD-10-CM | POA: Diagnosis not present

## 2015-11-24 DIAGNOSIS — Z736 Limitation of activities due to disability: Secondary | ICD-10-CM | POA: Diagnosis not present

## 2015-11-24 DIAGNOSIS — J9601 Acute respiratory failure with hypoxia: Secondary | ICD-10-CM | POA: Diagnosis not present

## 2015-11-24 DIAGNOSIS — F1120 Opioid dependence, uncomplicated: Secondary | ICD-10-CM | POA: Diagnosis not present

## 2015-11-24 DIAGNOSIS — A419 Sepsis, unspecified organism: Secondary | ICD-10-CM | POA: Diagnosis not present

## 2015-11-24 DIAGNOSIS — M545 Low back pain: Secondary | ICD-10-CM | POA: Diagnosis not present

## 2015-11-24 DIAGNOSIS — Z79891 Long term (current) use of opiate analgesic: Secondary | ICD-10-CM | POA: Diagnosis not present

## 2015-11-24 DIAGNOSIS — F419 Anxiety disorder, unspecified: Secondary | ICD-10-CM | POA: Diagnosis not present

## 2015-11-24 DIAGNOSIS — D638 Anemia in other chronic diseases classified elsewhere: Secondary | ICD-10-CM

## 2015-11-24 DIAGNOSIS — I5031 Acute diastolic (congestive) heart failure: Secondary | ICD-10-CM

## 2015-11-24 HISTORY — DX: Pneumonitis due to inhalation of food and vomit: J69.0

## 2015-11-24 MED ORDER — LEVOFLOXACIN 750 MG PO TABS
750.0000 mg | ORAL_TABLET | Freq: Every day | ORAL | Status: DC
  Administered 2015-11-24: 11:00:00 750 mg via ORAL
  Filled 2015-11-24: qty 1

## 2015-11-24 MED ORDER — DOXYCYCLINE HYCLATE 100 MG PO CAPS: 100.0000 mg | ORAL_CAPSULE | Freq: Two times a day (BID) | ORAL | Status: DC

## 2015-11-24 MED ORDER — DRONABINOL 2.5 MG PO CAPS: 2.5000 mg | ORAL_CAPSULE | Freq: Two times a day (BID) | ORAL | Status: DC

## 2015-11-24 NOTE — Clinical Social Work Note (Signed)
Pt is ready for discharge to [facility] today. [employer] Harveyauth has been obtained. PT's husband will transport. RN called reported. Facility has received discharge information and is ready to admit pt. CSW is signing off as no further needs identified.   Dede QuerySarah Nayara Taplin, MSW, LCSW Clinical Social Worker  [phone]

## 2015-11-24 NOTE — Progress Notes (Signed)
Patient had no c/o pain this shift VSS Patient started on oral Levaquin today Received MD order to [facility], report called to Henreitta CeaAbbey Walsh RN Patient [facility] wheelchair [facility]  Prescription given to [facility]

## 2015-11-24 NOTE — Discharge Summary (Signed)
[facility] [place] [facility]   PATIENT NAME: Cheryl [name]    MR#:  [id number]  DATE OF BIRTH:  May 01, 1951  DATE OF ADMISSION:  11/19/2015 ADMITTING PHYSICIAN: Shaune Pollack, MD  DATE OF DISCHARGE: No discharge date for patient encounter.  PRIMARY CARE PHYSICIAN: Mila Merry, MD     ADMISSION DIAGNOSIS:  Hypoxia [R09.02] HCAP (healthcare-associated pneumonia) [J18.9] Sepsis, due to unspecified organism (HCC) [A41.9]  DISCHARGE DIAGNOSIS:  Principal Problem:   Acute on chronic respiratory failure with hypoxia (HCC) Active Problems:   Pneumonia   Hypotension   Lactic acidosis   Aspiration pneumonitis (HCC)   Dysphagia   Acute diastolic CHF (congestive heart failure) (HCC)   Leukocytosis   Anemia of chronic disease   SECONDARY DIAGNOSIS:   Past Medical History  Diagnosis Date  . COPD (chronic obstructive pulmonary disease) (HCC)   . Hypertension   . Arthritis   . Hyperlipidemia   . GERD (gastroesophageal reflux disease)   . Neuropathy (HCC) 2010  . Oxygen deficiency   . Coronary artery disease   . Headache   . Anxiety   . Peripheral vascular disease (HCC)   . Vitamin D deficiency   . Migraines   . Chronic pain   . DVT (deep venous thrombosis) (HCC)   . Pneumonia   . Osteoporosis   . Allergy   . Asthma     .pro HOSPITAL COURSE:   The patient is 64 year old Caucasian female with past medical history significant for history of multiple medical problems including chronic pain syndrome, neuropathy, coronary artery disease, peripheral vascular disease, recurrent pneumonia, COPD, intermittent oxygen use, who comes back to the hospital with complaints of fever to 102, shortness of breath and hypoxia. In emergency room, she was noted to be hypotensive and hypoxic. Her chest x-ray revealed worsening pneumonia in the right mid lung area associated with pleural effusion. Patient was admitted to the hospital for further evaluation given IV  fluids and broad-spectrum antibiotic therapy. Blood cultures were taken as well as sputum cultures which were all negative. MRSA screen was negative. This conservative therapy. Patient's condition improved. She was seen by speech therapist who recommended dysphagia 2 diet with nectar thick liquids due to modified barium swallowing study done during this hospitalization, although patient adamantly refuses this diet. We, however, recommend this diet to prevent her from having recurrent pneumonias which are considered to be at least partially due to aspiration. With antibiotic therapy patient's white blood cell count improved. She is being discharged on doxycycline orally. While in the hospital, she was noted to have acute diastolic congestive heart failure and was diuresed with significant improvement of her oxygenation and well-being. It was felt that patient's acute respiratory failure with hypoxia at least partially was related to congestive heart failure. It is recommended to follow up with cardiologist as outpatient for further recommendations Discussion by problem #1 Acute on chronic respiratory failure with hypoxia , likely due to COPD exacerbation, pneumonia, healthcare associated as well as aspiration and fluid overload / acute diastolic CHF, patient is to continue doxycycline for 10 days as well as Lasix for diastolic CHF. Patient is to follow-up with primary care physician as well as cardiologist as outpatient. She is to continue oxygen by nasal cannula keeping her pulse oximeter at around 94% ,  continue Spiriva and Advair Diskus and DuoNeb nebs, appreciate  pulmonology consultation. wean off oxygen as tolerated as outpatient.     #2 hypotension with lactic acidosis, concerning for Septic  shock. Hypotension improved. Leukocytosis  has resolved . Blood cultures are negative, sputum cultures and negative. MRSA screen was negative. Resumed home Lasix, following blood pressure readings closely   #3  Pneumonia, healthcare associated as well as aspiration pneumonitis, continue doxycycline for 10 days to complete course . White blood cell count is normal, negative blood culture and sputum culture so far. MRSA negative . Sputum cultures are not obtained   #4 COPDExacerbation ,  continue nebulizer treatment.    #5 Anemia of chronic disease. stable.   #6 generalized weakness , discharge to skilled nursing facility for rehabilitation  today   #7. Dysphagia, patient is to continue dysphagia diet with nectar thick liquids per modified barium swallowing study done during this admission. She is very reluctant to use this diet and begs [place] to change to regular diet. She was explained implications of not following our recommendations.   #8. Acute diastolic CHF, etiology is unclear and needs to be worked up by cardiologist as outpatient, patient would benefit from cardiology consultation in the next 1-2 weeks. Continue Lasix, watching patient's blood pressure readings closely  DISCHARGE CONDITIONS:   Stable   CONSULTS OBTAINED:     DRUG ALLERGIES:   Allergies  Allergen Reactions  . Percocet [Oxycodone-Acetaminophen] Hives  . Aspirin Nausea And Vomiting and Other (See Comments)    Reaction:  GI upset   . Codeine Nausea And Vomiting and Other (See Comments)    Reaction:  GI upset   . Morphine Other (See Comments)    Reaction:  Unknown   . Pregabalin Other (See Comments)    Reaction:  Makes pt sleep excessively.    . Propoxyphene Other (See Comments)    Reaction:  GI upset   . Sulfa Antibiotics Rash and Other (See Comments)    Reaction:  GI upset     DISCHARGE MEDICATIONS:   Current Discharge Medication List    START taking these medications   Details  doxycycline (VIBRAMYCIN) 100 MG capsule Take 1 capsule (100 mg total) by mouth 2 (two) times daily. Qty: 20 capsule, Refills: 0    dronabinol (MARINOL) 2.5 MG capsule Take 1 capsule (2.5 mg total) by mouth 2 (two) times daily  before lunch and supper. Qty: 60 capsule, Refills: 1      CONTINUE these medications which have NOT CHANGED   Details  acetaminophen (TYLENOL) 500 MG tablet Take 1,000 mg by mouth every 6 (six) hours as needed for mild pain or headache.     albuterol (PROVENTIL HFA;VENTOLIN HFA) 108 (90 BASE) MCG/ACT inhaler Inhale 2 puffs into the lungs every 4 (four) hours as needed for wheezing or shortness of breath. Qty: 18 g, Refills: 3    albuterol (PROVENTIL) (2.5 MG/3ML) 0.083% nebulizer solution Take 3 mLs (2.5 mg total) by nebulization every 4 (four) hours as needed for wheezing. Qty: 75 mL, Refills: 12    ALPRAZolam (XANAX) 0.5 MG tablet Take 0.5 mg by mouth every 4 (four) hours as needed for anxiety.     benzonatate (TESSALON) 200 MG capsule Take 1 capsule (200 mg total) by mouth 3 (three) times daily as needed for cough. Qty: 20 capsule, Refills: 0    celecoxib (CELEBREX) 200 MG capsule Take 200 mg by mouth daily.    clopidogrel (PLAVIX) 75 MG tablet Take 75 mg by mouth daily.    diphenoxylate-atropine (LOMOTIL) 2.5-0.025 MG per tablet Take 2 tablets by mouth 4 (four) times daily as needed for diarrhea or loose stools.  donepezil (ARICEPT) 10 MG tablet Take 10 mg by mouth at bedtime.    estropipate (OGEN) 0.75 MG tablet Take 1 tablet (0.75 mg total) by mouth daily. Qty: 30 tablet, Refills: 6    fentaNYL (DURAGESIC - DOSED MCG/HR) 50 MCG/HR Place 1 patch (50 mcg total) onto the skin every 3 (three) days. Qty: 10 patch, Refills: 0   Associated Diagnoses: Chronic pain syndrome    Fluticasone-Salmeterol (ADVAIR) 250-50 MCG/DOSE AEPB Inhale 1 puff into the lungs 2 (two) times daily.    furosemide (LASIX) 40 MG tablet Take 40 mg by mouth daily.    guaiFENesin-dextromethorphan (ROBITUSSIN DM) 100-10 MG/5ML syrup Take 5 mLs by mouth every 4 (four) hours as needed for cough. Qty: 118 mL, Refills: 0    pantoprazole (PROTONIX) 40 MG tablet Take 40 mg by mouth daily.     pregabalin  (LYRICA) 150 MG capsule Take 1 capsule (150 mg total) by mouth 2 (two) times daily. Qty: 60 capsule, Refills: 2   Associated Diagnoses: Neuropathy (HCC)    simvastatin (ZOCOR) 10 MG tablet Take 10 mg by mouth at bedtime.     SUMAtriptan (IMITREX) 25 MG tablet Take 25 mg by mouth as needed for migraine. May repeat in 2 hours if headache persists or recurs.    tiotropium (SPIRIVA) 18 MCG inhalation capsule Place 1 capsule (18 mcg total) into inhaler and inhale daily. Qty: 30 capsule, Refills: 12    traMADol (ULTRAM) 50 MG tablet Take 100 mg by mouth 4 (four) times daily.      STOP taking these medications     levofloxacin (LEVAQUIN) 750 MG tablet          DISCHARGE INSTRUCTIONS:    Patient is to follow-up with primary care physician Dr. Sherrie Mustache and cardiologist as outpatient   If you experience worsening of your admission symptoms, develop shortness of breath, life threatening emergency, suicidal or homicidal thoughts you must seek medical attention immediately by calling 911 or calling your MD immediately  if symptoms less severe.  You Must read complete instructions/literature along with all the possible adverse reactions/side effects for all the Medicines you take and that have been prescribed to you. Take any new Medicines after you have completely understood and accept all the possible adverse reactions/side effects.   Please note  You were cared for by a hospitalist during your hospital stay. If you have any questions about your discharge medications or the care you received while you were in the hospital after you are discharged, you can call the unit and asked to speak with the hospitalist on call if the hospitalist that took care of you is not available. Once you are discharged, your primary care physician will handle any further medical issues. Please note that NO REFILLS for any discharge medications will be authorized once you are discharged, as it is imperative that you  return to your primary care physician (or establish a relationship with a primary care physician if you do not have one) for your aftercare needs so that they can reassess your need for medications and monitor your lab values.    Today   CHIEF COMPLAINT:   Chief Complaint  Patient presents with  . Code Sepsis    HISTORY OF PRESENT ILLNESS:  Jewell Haught  is a 64 y.o. female with a known history of multiple medical problems including chronic pain syndrome, neuropathy, coronary artery disease, peripheral vascular disease, recurrent pneumonia, COPD, intermittent oxygen use, who comes back to the hospital with complaints  of fever to 102, shortness of breath and hypoxia. In emergency room, she was noted to be hypotensive and hypoxic. Her chest x-ray revealed worsening pneumonia in the right mid lung area associated with pleural effusion. Patient was admitted to the hospital for further evaluation given IV fluids and broad-spectrum antibiotic therapy. Blood cultures were taken as well as sputum cultures which were all negative. MRSA screen was negative. This conservative therapy. Patient's condition improved. She was seen by speech therapist who recommended dysphagia 2 diet with nectar thick liquids due to modified barium swallowing study done during this hospitalization, although patient adamantly refuses this diet. We, however, recommend this diet to prevent her from having recurrent pneumonias which are considered to be at least partially due to aspiration. With antibiotic therapy patient's white blood cell count improved. She is being discharged on doxycycline orally. While in the hospital, she was noted to have acute diastolic congestive heart failure and was diuresed with significant improvement of her oxygenation and well-being. It was felt that patient's acute respiratory failure with hypoxia at least partially was related to congestive heart failure. It is recommended to follow up with cardiologist as  outpatient for further recommendations Discussion by problem #1 Acute on chronic respiratory failure with hypoxia , likely due to COPD exacerbation, pneumonia, healthcare associated as well as aspiration and fluid overload / acute diastolic CHF, patient is to continue doxycycline for 10 days as well as Lasix for diastolic CHF. Patient is to follow-up with primary care physician as well as cardiologist as outpatient. She is to continue oxygen by nasal cannula keeping her pulse oximeter at around 94% ,  continue Spiriva and Advair Diskus and DuoNeb nebs, appreciate  pulmonology consultation. wean off oxygen as tolerated as outpatient.     #2 hypotension with lactic acidosis, concerning for Septic shock. Hypotension improved. Leukocytosis  has resolved . Blood cultures are negative, sputum cultures and negative. MRSA screen was negative. Resumed home Lasix, following blood pressure readings closely   #3 Pneumonia, healthcare associated as well as aspiration pneumonitis, continue doxycycline for 10 days to complete course . White blood cell count is normal, negative blood culture and sputum culture so far. MRSA negative . Sputum cultures are not obtained   #4 COPDExacerbation ,  continue nebulizer treatment.    #5 Anemia of chronic disease. stable.   #6 generalized weakness , discharge to skilled nursing facility for rehabilitation  today   #7. Dysphagia, patient is to continue dysphagia diet with nectar thick liquids per modified barium swallowing study done during this admission. She is very reluctant to use this diet and begs [place] to change to regular diet. She was explained implications of not following our recommendations.   #8. Acute diastolic CHF, etiology is unclear and needs to be worked up by cardiologist as outpatient, patient would benefit from cardiology consultation in the next 1-2 weeks. Continue Lasix, watching patient's blood pressure readings closely     VITAL SIGNS:  Blood  pressure 94/56, pulse 77, temperature 97.7 F (36.5 C), temperature source Oral, resp. rate 18, height  (1.549 m), weight 45.813 kg (101 lb), SpO2 98 %.  I/O:   Intake/Output Summary (Last 24 hours) at 11/24/15 1020 Last data filed at 11/23/15 1800  Gross per 24 hour  Intake    240 ml  Output    400 ml  Net   -160 ml    PHYSICAL EXAMINATION:  GENERAL:  64 y.o.-year-old patient lying in the bed with no acute distress.  EYES: Pupils equal, round, reactive to light and accommodation. No scleral icterus. Extraocular muscles intact.  HEENT: Head atraumatic, normocephalic. Oropharynx and nasopharynx clear.  NECK:  Supple, no jugular venous distention. No thyroid enlargement, no tenderness.  LUNGS: Normal breath sounds bilaterally, no wheezing, rales,rhonchi , , crackles were heard in the right anteriorly as well as posteriorly .scattered crepitations. No use of accessory muscles of respiration.  CARDIOVASCULAR: S1, S2 normal. No murmurs, rubs, or gallops.  ABDOMEN: Soft, non-tender, non-distended. Bowel sounds present. No organomegaly or mass.  EXTREMITIES: No pedal edema, cyanosis, or clubbing.  NEUROLOGIC: Cranial nerves II through XII are intact. Muscle strength 5/5 in all extremities. Sensation intact. Gait not checked.  PSYCHIATRIC: The patient is alert and oriented x 3.  SKIN: No obvious rash, lesion, or ulcer.   DATA REVIEW:   CBC  Recent Labs Lab 11/23/15 0725  WBC 8.3  HGB 8.3*  HCT 24.9*  PLT 355    Chemistries   Recent Labs Lab 11/19/15 0853  11/23/15 0725  NA 138  < > 138  K 4.0  < > 3.7  CL 103  < > 102  CO2 30  < > 30  GLUCOSE 95  < > 102*  BUN 18  < > 12  CREATININE 1.28*  < > 0.67  CALCIUM 8.1*  < > 8.4*  AST 23  --   --   ALT 9*  --   --   ALKPHOS 95  --   --   BILITOT 0.5  --   --   < > = values in this interval not displayed.  Cardiac Enzymes  Recent Labs Lab 11/19/15 0853  TROPONINI <0.03    Microbiology Results  Results for  orders placed or performed during the hospital encounter of 11/19/15  Blood Culture (routine x 2)     Status: None (Preliminary result)   Collection Time: 11/19/15  8:53 AM  Result Value Ref Range Status   Specimen Description BLOOD LEFT AC  Final   Special Requests BOTTLES DRAWN AEROBIC AND ANAEROBIC 5ML  Final   Culture NO GROWTH 4 DAYS  Final   Report Status PENDING  Incomplete  Urine culture     Status: None   Collection Time: 11/19/15  8:53 AM  Result Value Ref Range Status   Specimen Description URINE, RANDOM  Final   Special Requests NONE  Final   Culture NO GROWTH 1 DAY  Final   Report Status 11/20/2015 FINAL  Final  Blood Culture (routine x 2)     Status: None (Preliminary result)   Collection Time: 11/19/15  9:01 AM  Result Value Ref Range Status   Specimen Description BLOOD RIGHT AC  Final   Special Requests   Final    BOTTLES DRAWN AEROBIC AND ANAEROBIC  AEROBIC 6ML ANAEROBIC 5ML   Culture NO GROWTH 4 DAYS  Final   Report Status PENDING  Incomplete  MRSA PCR Screening     Status: None   Collection Time: 11/19/15 12:07 PM  Result Value Ref Range Status   MRSA by PCR NEGATIVE NEGATIVE Final    Comment:        The GeneXpert MRSA Assay (FDA approved for NASAL specimens only), is one component of a comprehensive MRSA colonization surveillance program. It is not intended to diagnose MRSA infection nor to guide or monitor treatment for MRSA infections.     RADIOLOGY:  No results found.  EKG:   Orders placed or performed during the hospital  encounter of 11/19/15  . ED EKG 12-Lead  . ED EKG 12-Lead      Management plans discussed with the patient, family and they are in agreement.  CODE STATUS:     Code Status Orders        Start     Ordered   11/19/15 1219  Full code   Continuous     11/19/15 1218      TOTAL TIME TAKING CARE OF THIS PATIENT: 40 minutes.    Katharina Caper M.D on 11/24/2015 at 10:20 AM  Between 7am to 6pm - Pager -  [phone]  After 6pm go to [email or web address] - password [email or web address] [facility]  [facility]  Office  [phone]  CC: Primary care physician; Mila Merry, MD

## 2015-11-24 NOTE — Progress Notes (Signed)
Speech Language Pathology Dysphagia Treatment Patient Details Name: Cheryl MargaritaMary George [name] MRN: [id number] DOB: August 11, 1951 Today's Date: 11/24/2015 Time: [id number] SLP Time Calculation (min) (ACUTE ONLY): 23 min  Assessment / Plan / Recommendation Clinical Impression   pt continues to present with a mild to moderate oral pharyngeal dysphagia characterized by coughing and wet vocal quality post intake of thin liquids. Pt refuses to intake altered diet of ndds 2 with nectar thick liquids and drinks water and regular solids AMA.  MD was consulted and recommended to send to ALF on MBSS recommended diet  And allow facility to continue education and safety training. ST educated pt on aspiration precautions and safety. Pt able to retell diet recommendations however adamantly stated that she will not intake altered diet. ST educated on aspiration risk and ssx aspiration.     Diet Recommendation    NDDS2 with nectar due to silent aspiration,    SLP Plan Continue with current plan of care   Pertinent Vitals/Pain No pain reported    Swallowing Goals     General Behavior/Cognition: Alert;Uncooperative Patient Positioning: Upright in bed Oral care provided: N/A HPI: Cheryl DimmerMary Shreeve is a 64 y.o. female with a known history of recent pneumonia, COPD on oxygen 2 L at home when necessary, hypertension and CAD. The patient was sent to ED from nursing home due to fever 102, shortness of breath and hypoxia. She was found hypotensive and hypoxia in the ED. She was put on BiPAP and given normal saline bolus. Her chest x-ray show worsening right-sided pneumonia, so she was treated with antibiotics in the ED. The patient was just discharged recently after treatment of pneumonia, on Levaquin by mouth. She is not good historian, only complains of weakness. MD requested MBSS to rule out aspiration.  Oral Cavity - Oral Hygiene Does patient have any of the following "at risk" factors?: Oxygen therapy - cannula, mask, simple  oxygen devices Patient is AT RISK: Order set for Oral Care Protocol initiated -  "At risk" option selected (see row information)   Dysphagia Treatment Family/Caregiver Educated: aspiration precautions and concerns Treatment Methods: Skilled observation;Upgraded PO texture trial;Patient/caregiver education Patient observed directly with PO's: Yes Type of PO's observed: Dysphagia 2 (chopped);Thin liquids Feeding: Able to feed self Liquids provided via: Cup;Straw Pharyngeal Phase Signs & Symptoms: Delayed cough Amount of cueing: Independent   GO     Joycelyn RuaStacie Harris Sauber 11/24/2015, 11:57 AM

## 2015-11-24 NOTE — Progress Notes (Signed)
Pt appreciative of her Dys 3 diet with thin liquids.  Pt and husband concerned about being discharged today because she has been a repeat this month.  Will talk with MD about their concerns.  Otherwise, pt is feeling much better and is more engaged with staff.

## 2015-11-25 LAB — CULTURE, BLOOD (ROUTINE X 2)
CULTURE: NO GROWTH
Culture: NO GROWTH

## 2015-11-28 DIAGNOSIS — I10 Essential (primary) hypertension: Secondary | ICD-10-CM | POA: Diagnosis not present

## 2015-11-28 DIAGNOSIS — F419 Anxiety disorder, unspecified: Secondary | ICD-10-CM | POA: Diagnosis not present

## 2015-11-28 DIAGNOSIS — K219 Gastro-esophageal reflux disease without esophagitis: Secondary | ICD-10-CM | POA: Diagnosis not present

## 2015-11-28 DIAGNOSIS — Z720 Tobacco use: Secondary | ICD-10-CM | POA: Diagnosis not present

## 2015-11-28 DIAGNOSIS — G309 Alzheimer's disease, unspecified: Secondary | ICD-10-CM | POA: Diagnosis not present

## 2015-11-28 DIAGNOSIS — J690 Pneumonitis due to inhalation of food and vomit: Secondary | ICD-10-CM | POA: Diagnosis not present

## 2015-11-28 DIAGNOSIS — J449 Chronic obstructive pulmonary disease, unspecified: Secondary | ICD-10-CM | POA: Diagnosis not present

## 2015-12-04 ENCOUNTER — Other Ambulatory Visit: Payer: Self-pay | Admitting: Pain Medicine

## 2015-12-04 ENCOUNTER — Ambulatory Visit: Payer: Commercial Managed Care - HMO | Attending: Pain Medicine | Admitting: Pain Medicine

## 2015-12-04 ENCOUNTER — Encounter: Payer: Self-pay | Admitting: Pain Medicine

## 2015-12-04 VITALS — BP 104/90 | HR 74 | Temp 97.9°F | Resp 18 | Ht 61.0 in | Wt 95.0 lb

## 2015-12-04 DIAGNOSIS — M549 Dorsalgia, unspecified: Secondary | ICD-10-CM | POA: Insufficient documentation

## 2015-12-04 DIAGNOSIS — G894 Chronic pain syndrome: Secondary | ICD-10-CM

## 2015-12-04 DIAGNOSIS — M797 Fibromyalgia: Secondary | ICD-10-CM | POA: Diagnosis not present

## 2015-12-04 DIAGNOSIS — I739 Peripheral vascular disease, unspecified: Secondary | ICD-10-CM | POA: Insufficient documentation

## 2015-12-04 DIAGNOSIS — G8929 Other chronic pain: Secondary | ICD-10-CM | POA: Insufficient documentation

## 2015-12-04 DIAGNOSIS — Z79899 Other long term (current) drug therapy: Secondary | ICD-10-CM | POA: Diagnosis not present

## 2015-12-04 DIAGNOSIS — I10 Essential (primary) hypertension: Secondary | ICD-10-CM | POA: Insufficient documentation

## 2015-12-04 DIAGNOSIS — F17210 Nicotine dependence, cigarettes, uncomplicated: Secondary | ICD-10-CM | POA: Insufficient documentation

## 2015-12-04 DIAGNOSIS — D6489 Other specified anemias: Secondary | ICD-10-CM | POA: Insufficient documentation

## 2015-12-04 DIAGNOSIS — J449 Chronic obstructive pulmonary disease, unspecified: Secondary | ICD-10-CM | POA: Insufficient documentation

## 2015-12-04 DIAGNOSIS — Z79891 Long term (current) use of opiate analgesic: Secondary | ICD-10-CM | POA: Diagnosis not present

## 2015-12-04 DIAGNOSIS — R51 Headache: Secondary | ICD-10-CM | POA: Insufficient documentation

## 2015-12-04 DIAGNOSIS — M5416 Radiculopathy, lumbar region: Secondary | ICD-10-CM

## 2015-12-04 DIAGNOSIS — E785 Hyperlipidemia, unspecified: Secondary | ICD-10-CM | POA: Diagnosis not present

## 2015-12-04 DIAGNOSIS — M545 Low back pain, unspecified: Secondary | ICD-10-CM

## 2015-12-04 DIAGNOSIS — G43909 Migraine, unspecified, not intractable, without status migrainosus: Secondary | ICD-10-CM | POA: Insufficient documentation

## 2015-12-04 DIAGNOSIS — E559 Vitamin D deficiency, unspecified: Secondary | ICD-10-CM | POA: Insufficient documentation

## 2015-12-04 DIAGNOSIS — F1120 Opioid dependence, uncomplicated: Secondary | ICD-10-CM | POA: Diagnosis not present

## 2015-12-04 DIAGNOSIS — Z7189 Other specified counseling: Secondary | ICD-10-CM | POA: Diagnosis not present

## 2015-12-04 DIAGNOSIS — K219 Gastro-esophageal reflux disease without esophagitis: Secondary | ICD-10-CM | POA: Insufficient documentation

## 2015-12-04 DIAGNOSIS — I2510 Atherosclerotic heart disease of native coronary artery without angina pectoris: Secondary | ICD-10-CM | POA: Insufficient documentation

## 2015-12-04 DIAGNOSIS — F419 Anxiety disorder, unspecified: Secondary | ICD-10-CM | POA: Insufficient documentation

## 2015-12-04 DIAGNOSIS — Z86718 Personal history of other venous thrombosis and embolism: Secondary | ICD-10-CM | POA: Insufficient documentation

## 2015-12-04 DIAGNOSIS — G629 Polyneuropathy, unspecified: Secondary | ICD-10-CM | POA: Diagnosis not present

## 2015-12-04 DIAGNOSIS — M810 Age-related osteoporosis without current pathological fracture: Secondary | ICD-10-CM | POA: Insufficient documentation

## 2015-12-04 MED ORDER — FENTANYL 50 MCG/HR TD PT72: 50.0000 ug | MEDICATED_PATCH | TRANSDERMAL | Status: DC

## 2015-12-04 MED ORDER — PREGABALIN 150 MG PO CAPS: 150.0000 mg | ORAL_CAPSULE | Freq: Two times a day (BID) | ORAL | Status: DC

## 2015-12-04 NOTE — Patient Instructions (Signed)
[facility]

## 2015-12-04 NOTE — Progress Notes (Signed)
Patient's Name: Cheryl [name] MRN: [id number] DOB: Sep 10, 1951 DOS: 12/04/2015  Primary Reason(s) for Visit: Encounter for Medication Management CC: Back Pain   HPI:    Cheryl [name] is a 65 y.o. year old, female patient, who returns today as an established patient. She has Essential hypertension; GERD (gastroesophageal reflux disease); Hyperlipemia; Absolute anemia; Anxiety; Airway hyperreactivity; Back pain, thoracic; Coronary artery disease; Diaphoresis; Excessive falling; Alteration in bowel elimination: incontinence; Insomnia; Decreased testosterone level; Leg weakness; Menopausal symptom; Migraine; Neuropathy (HCC); Fecal occult blood test positive; OP (osteoporosis); Episodic paroxysmal anxiety disorder; Disorder of peripheral nervous system (HCC); Peripheral blood vessel disorder (HCC); Compulsive tobacco user syndrome; Urinary incontinence; Vitamin D deficiency; Weight loss; COPD (chronic obstructive pulmonary disease) (HCC); Dementia; Gastrointestinal complaints, nonspecific; Lumbar radicular pain; Chronic low back pain; Other long term (current) drug therapy; Encounter for long-term opiate analgesic use; Encounter for therapeutic drug level monitoring; Opiate use; Uncomplicated opioid dependence (HCC); Chronic pain syndrome; Hypercholesteremia; Peripheral nerve disease (HCC); Peripheral vascular disease (HCC); Platelet inhibition due to Plavix; Abnormal mammogram of right breast; Dilated intrahepatic bile duct; Anemia; Sepsis (HCC); Malnutrition of moderate degree; Pneumonia; Acute on chronic respiratory failure with hypoxia (HCC); Hypotension; Lactic acidosis; Aspiration pneumonitis (HCC); Dysphagia; Acute diastolic CHF (congestive heart failure) (HCC); Anemia of chronic disease; Leukocytosis; Avitaminosis D; Long term current use of opiate analgesic; Long term prescription opiate use; Encounter for chronic pain management; Chronic pain; and Fibromyalgia on her problem list.. Her primarily concern  today is the Back Pain     The patient returns to the clinic today for pharmacological management of her chronic pain. The patient indicates no problems with her medications.  Today's   Reported level of pain is incompatible with clinical obrservations. This may be secondary to a possible lack of understanding on how the pain scale works.    Date of Last Visit: 09/04/15 Service Provided on Last Visit: Med Refill  Pharmacotherapy Review:   Side-effects or Adverse reactions: None reported Effectiveness: Described as relatively effective, allowing for increase in activities of daily living (ADL) Onset of action: Within expected pharmacological parameters Duration of action: Within normal limits for medication Peak effect: Timing and results are as within normal expected parameters [place] PMP: Compliant with practice rules and regulations UDS Results: Compliant UDS Interpretation: Patient appears to be compliant with practice rules and regulations Medication Assessment Form: Reviewed. Patient indicates being compliant with therapy Treatment compliance: Compliant Substance Use Disorder (SUD) Risk Level: Low Pharmacologic Plan: Continue therapy as is  Lab Work: Illicit Drugs Lab Results  Component Value Date   THCU NONE DETECTED 10/10/2015   PCPSCRNUR NONE DETECTED 10/10/2015   MDMA NONE DETECTED 10/10/2015   AMPHETMU NONE DETECTED 10/10/2015   METHADONE NONE DETECTED 10/10/2015    Inflammation Markers No results found for: ESRSEDRATE, CRP  Renal Function Lab Results  Component Value Date   BUN 12 11/23/2015   CREATININE 0.67 11/23/2015   GFRAA >60 11/23/2015   GFRNONAA >60 11/23/2015    Hepatic Function Lab Results  Component Value Date   AST 23 11/19/2015   ALT 9* 11/19/2015   ALBUMIN 2.7* 11/19/2015    Electrolytes Lab Results  Component Value Date   NA 138 11/23/2015   K 3.7 11/23/2015   CL 102 11/23/2015   CALCIUM 8.4* 11/23/2015   MG 1.9 05/07/2013     Allergies:  Cheryl [name] is allergic to percocet; aspirin; codeine; morphine; pregabalin; propoxyphene; and sulfa antibiotics.  Meds:  The patient has a current medication list which includes  the following prescription(s): acetaminophen, albuterol, albuterol, alprazolam, benzonatate, celecoxib, clopidogrel, diphenoxylate-atropine, donepezil, doxycycline, dronabinol, estropipate, fentanyl, fluticasone-salmeterol, furosemide, guaifenesin-dextromethorphan, pantoprazole, pregabalin, simvastatin, sumatriptan, tiotropium, tramadol, fentanyl, and fentanyl. Requested Prescriptions   Signed Prescriptions Disp Refills  . fentaNYL (DURAGESIC - DOSED MCG/HR) 50 MCG/HR 10 patch 0    Sig: Place 1 patch (50 mcg total) onto the skin every 3 (three) days.  . fentaNYL (DURAGESIC - DOSED MCG/HR) 50 MCG/HR 10 patch 0    Sig: Place 1 patch (50 mcg total) onto the skin every 3 (three) days.  . fentaNYL (DURAGESIC - DOSED MCG/HR) 50 MCG/HR 10 patch 0    Sig: Place 1 patch (50 mcg total) onto the skin every 3 (three) days.  . pregabalin (LYRICA) 150 MG capsule 60 capsule 2    Sig: Take 1 capsule (150 mg total) by mouth 2 (two) times daily.    ROS:  Constitutional: Afebrile, no chills, well hydrated and well nourished Gastrointestinal: negative Musculoskeletal:negative Neurological: negative Behavioral/Psych: negative  PFSH:  Medical:  Cheryl [name]  has a past medical history of COPD (chronic obstructive pulmonary disease) (HCC); Hypertension; Arthritis; Hyperlipidemia; GERD (gastroesophageal reflux disease); Neuropathy (HCC) (2010); Oxygen deficiency; Coronary artery disease; Headache; Anxiety; Peripheral vascular disease (HCC); Vitamin D deficiency; Migraines; Chronic pain; DVT (deep venous thrombosis) (HCC); Pneumonia; Osteoporosis; Allergy; and Asthma. Family: family history includes Arthritis in her mother; Cancer in her brother, brother, and mother; Diabetes in her brother, mother, and sister; Heart disease  in her father and mother. Surgical:  has past surgical history that includes Appendectomy; Spine surgery; Foot surgery (Bilateral); cardiac catherization (10/31/2009); abdomnal aortic stent (05/30/2008); Abdominal hysterectomy (1975); Cholecystectomy (1972); Cervical fusion (C5 - 6/C6-7); Appendectomy; Colonoscopy with propofol (N/A, 07/27/2015); and Esophagogastroduodenoscopy (egd) with propofol (N/A, 07/27/2015). Tobacco:  reports that she has been smoking Cigarettes.  She has a 50 pack-year smoking history. She does not have any smokeless tobacco history on file. Alcohol:  reports that she does not drink alcohol. Drug:  reports that she does not use illicit drugs.  Physical Exam:  Vitals: There were no vitals filed for this visit.Calculated BMI: There is no weight on file to calculate BMI. General appearance: alert, cooperative, appears stated age and no distress. The patient returns to the clinic today on a wheelchair. Eyes: PERLA Respiratory: No evidence respiratory distress, no audible rales or ronchi and no use of accessory muscles of respiration Neck: no adenopathy, no carotid bruit, no JVD, supple, symmetrical, trachea midline and thyroid not enlarged, symmetric, no tenderness/mass/nodules.   Assessment:  Encounter Diagnosis:  Primary Diagnosis: Chronic pain [G89.29]  Plan:   Interventional Therapies: None at this point.    Corrie DandyMary was seen today for back pain.  Diagnoses and all orders for this visit:  Chronic pain -     Comprehensive metabolic panel -     C-reactive protein -     Magnesium -     Sedimentation rate -     Vitamin D pnl(25-hydrxy+1,25-dihy)-bld -     Vitamin B12 -     fentaNYL (DURAGESIC - DOSED MCG/HR) 50 MCG/HR; Place 1 patch (50 mcg total) onto the skin every 3 (three) days. -     fentaNYL (DURAGESIC - DOSED MCG/HR) 50 MCG/HR; Place 1 patch (50 mcg total) onto the skin every 3 (three) days. -     fentaNYL (DURAGESIC - DOSED MCG/HR) 50 MCG/HR; Place 1 patch (50  mcg total) onto the skin every 3 (three) days.  Long term current use of opiate analgesic -  Drugs of abuse screen w/o alc, rtn urine-sln  Long term prescription opiate use  Encounter for chronic pain management  Chronic low back pain  Lumbar radicular pain  Chronic pain syndrome  Neuropathy (HCC)  Fibromyalgia -     pregabalin (LYRICA) 150 MG capsule; Take 1 capsule (150 mg total) by mouth 2 (two) times daily.     There are no Patient Instructions on file for this visit. Medications discontinued today:  Medications Discontinued During This Encounter  Medication Reason  . amoxicillin-clavulanate (AUGMENTIN) 875-125 MG tablet Error  . azithromycin (ZITHROMAX) 250 MG tablet Error  . cyclobenzaprine (FLEXERIL) 5 MG tablet Error  . donepezil (ARICEPT) 10 MG tablet Error  . estropipate (OGEN) 1.5 MG tablet Error  . ferrous sulfate 325 (65 FE) MG tablet Error  . lansoprazole (PREVACID) 30 MG capsule Error  . levofloxacin (LEVAQUIN) 750 MG tablet Error  . mupirocin ointment (BACTROBAN) 2 % Error  . mupirocin ointment (BACTROBAN) 2 % Error  . polyethylene glycol-electrolytes (NULYTELY/GOLYTELY) 420 g solution Error  . RA TUSSIN CGH/CHEST CONGEST DM 100-10 MG/5ML liquid Error  . silver sulfADIAZINE (SILVADENE) 1 % cream Error  . fentaNYL (DURAGESIC - DOSED MCG/HR) 50 MCG/HR Reorder  . pregabalin (LYRICA) 150 MG capsule Reorder   Medications administered today:  Ms. Stetler had no medications administered during this visit.  Primary Care Physician: Mila Merry, MD Location: [facility] Note by: Sydnee Levans. Laban Emperor, M.D, DABA, DABAPM, DABPM, DABIPP, FIPP

## 2015-12-04 NOTE — Progress Notes (Signed)
Safety precautions to be maintained throughout the outpatient stay will include: orient to surroundings, keep bed in low position, maintain call bell within reach at all times, provide assistance with transfer out of bed and ambulation. Patient is living in [facility] at present and could not bring pill bottles to appointment.

## 2015-12-05 DIAGNOSIS — J189 Pneumonia, unspecified organism: Secondary | ICD-10-CM | POA: Diagnosis not present

## 2015-12-08 ENCOUNTER — Other Ambulatory Visit: Payer: Self-pay | Admitting: *Deleted

## 2015-12-08 NOTE — Patient Outreach (Addendum)
[facility] Care Management  12/08/2015  Cheryl MargaritaMary George [name] 1951/05/09 [id number]   Follow up phone call to patient to check status of her return stay [facility].  Per patient, she is progressing well.  Her test show that her pneumonia has cleared.  Speech therapy has released her today, however she continues to receive physical and occupational therapy.  Per patient, she was told that her pneumonia was mainly due to aspiration and that she would need to eat and drinking sitting up.  This Child psychotherapistsocial worker contacted Beth-discharge planner to discuss expected discharge date and to explore any barriers that need to be addressed.  Beth not in yet, message left for a return call.  Nursing home visit scheduled for 12/12/15.

## 2015-12-09 ENCOUNTER — Other Ambulatory Visit: Payer: Self-pay | Admitting: *Deleted

## 2015-12-09 NOTE — Patient Outreach (Signed)
[facility]) Care Management  12/09/2015  Cheryl MargaritaMary George [name] 20-May-1951 [id number]   Return phone call from discharge planner-[name] of [facility].  Per [place], patient to tentatively for 12/11/15.  Patient will discharge with home health using [facility].  No barriers to discharge identified.    Adriana ReamsChrystal Paulanthony Gleaves, LCSW [facility] Care Management [phone]

## 2015-12-10 LAB — TOXASSURE SELECT 13 (MW), URINE: PDF: 0

## 2015-12-11 ENCOUNTER — Other Ambulatory Visit: Payer: Self-pay | Admitting: Pain Medicine

## 2015-12-12 DIAGNOSIS — J690 Pneumonitis due to inhalation of food and vomit: Secondary | ICD-10-CM | POA: Diagnosis not present

## 2015-12-15 ENCOUNTER — Other Ambulatory Visit: Payer: Self-pay | Admitting: *Deleted

## 2015-12-15 NOTE — Patient Outreach (Signed)
[facility]) Care Management  12/15/2015  Cheryl [name] Jul 20, 1951 [id number]   Phone call to patient's spouse to confirm patient's discharge from [facility].  Per patient's spouse, patient's discharge for 12/11/14 was cancelled.  Per patient's spouse "she got sick again and had to stay".  Plan:  This social worker will visit patient within the next week to follow up with patient and  discharge planner.  [name] [facility] Care Management [phone]

## 2015-12-15 NOTE — Telephone Encounter (Signed)
Request for e-script, phone, and/or fax refilled denied, based on my personal practicing policy of no e-script, phone, and/or fax refills outside of appointments. 

## 2015-12-18 ENCOUNTER — Telehealth: Payer: Self-pay

## 2015-12-18 ENCOUNTER — Other Ambulatory Visit: Payer: Self-pay | Admitting: Pain Medicine

## 2015-12-18 DIAGNOSIS — M150 Primary generalized (osteo)arthritis: Principal | ICD-10-CM

## 2015-12-18 DIAGNOSIS — Z7901 Long term (current) use of anticoagulants: Secondary | ICD-10-CM | POA: Insufficient documentation

## 2015-12-18 DIAGNOSIS — M159 Polyosteoarthritis, unspecified: Secondary | ICD-10-CM

## 2015-12-18 DIAGNOSIS — M1990 Unspecified osteoarthritis, unspecified site: Secondary | ICD-10-CM | POA: Insufficient documentation

## 2015-12-18 NOTE — Telephone Encounter (Signed)
Pt was here on 1/9 Dr. Laban Emperor was suppose to give her a script for Celebrex pt husband has called several times trying to fix this issue. Please call pt husband because pt is in the nursing home

## 2015-12-18 NOTE — Telephone Encounter (Signed)
Spoke with patient husband.  He would like Celebrex to be sent to pharmacy.  Will speak to Dr Laban Emperor and see if he will order it.

## 2015-12-19 ENCOUNTER — Telehealth: Payer: Self-pay | Admitting: *Deleted

## 2015-12-19 ENCOUNTER — Other Ambulatory Visit: Payer: Self-pay | Admitting: *Deleted

## 2015-12-19 DIAGNOSIS — J189 Pneumonia, unspecified organism: Secondary | ICD-10-CM | POA: Diagnosis not present

## 2015-12-19 NOTE — Telephone Encounter (Signed)
Attempted to call husband and let him know that Dr Laban Emperor will NOT be ordering the Celebrex due to the fact that she is on Plavix.

## 2015-12-19 NOTE — Telephone Encounter (Signed)
Notified patients husband that the celebrex was not going to be continued.

## 2015-12-19 NOTE — Patient Outreach (Signed)
[facility]) Care Management  12/19/2015  Cheryl [name] Jun 02, 1951 [id number]   Phone call to discharge planner, Beth at [facility].  Per Waynetta Sandy, patient has 3 more days of antibiotics and the plan is for her to discharge home on Friday, 12/21/14 with home health.   RNCM to be notified of discharge plan.    [name] [facility] Care Management [phone]

## 2015-12-25 ENCOUNTER — Telehealth: Payer: Self-pay | Admitting: Family Medicine

## 2015-12-25 DIAGNOSIS — I110 Hypertensive heart disease with heart failure: Secondary | ICD-10-CM | POA: Diagnosis not present

## 2015-12-25 DIAGNOSIS — J9612 Chronic respiratory failure with hypercapnia: Secondary | ICD-10-CM | POA: Diagnosis not present

## 2015-12-25 DIAGNOSIS — G609 Hereditary and idiopathic neuropathy, unspecified: Secondary | ICD-10-CM | POA: Diagnosis not present

## 2015-12-25 DIAGNOSIS — J441 Chronic obstructive pulmonary disease with (acute) exacerbation: Secondary | ICD-10-CM | POA: Diagnosis not present

## 2015-12-25 DIAGNOSIS — J9611 Chronic respiratory failure with hypoxia: Secondary | ICD-10-CM | POA: Diagnosis not present

## 2015-12-25 DIAGNOSIS — I5030 Unspecified diastolic (congestive) heart failure: Secondary | ICD-10-CM | POA: Diagnosis not present

## 2015-12-25 DIAGNOSIS — J45909 Unspecified asthma, uncomplicated: Secondary | ICD-10-CM | POA: Diagnosis not present

## 2015-12-25 DIAGNOSIS — R1310 Dysphagia, unspecified: Secondary | ICD-10-CM | POA: Diagnosis not present

## 2015-12-25 DIAGNOSIS — I739 Peripheral vascular disease, unspecified: Secondary | ICD-10-CM | POA: Diagnosis not present

## 2015-12-25 NOTE — Telephone Encounter (Signed)
They need an order for skilled nurse visits,  frequency 2x week for 3 weeks and once a week for 5 weeks.  Please ok  [phone]  Thanks  Barth Kirks

## 2015-12-25 NOTE — Telephone Encounter (Signed)
That's fine

## 2015-12-25 NOTE — Telephone Encounter (Signed)
Cheryl [name] was notified.

## 2015-12-26 ENCOUNTER — Telehealth: Payer: Self-pay | Admitting: Family Medicine

## 2015-12-26 DIAGNOSIS — G609 Hereditary and idiopathic neuropathy, unspecified: Secondary | ICD-10-CM | POA: Diagnosis not present

## 2015-12-26 DIAGNOSIS — J9611 Chronic respiratory failure with hypoxia: Secondary | ICD-10-CM | POA: Diagnosis not present

## 2015-12-26 DIAGNOSIS — R1310 Dysphagia, unspecified: Secondary | ICD-10-CM | POA: Diagnosis not present

## 2015-12-26 DIAGNOSIS — J441 Chronic obstructive pulmonary disease with (acute) exacerbation: Secondary | ICD-10-CM | POA: Diagnosis not present

## 2015-12-26 DIAGNOSIS — J9612 Chronic respiratory failure with hypercapnia: Secondary | ICD-10-CM | POA: Diagnosis not present

## 2015-12-26 DIAGNOSIS — I5030 Unspecified diastolic (congestive) heart failure: Secondary | ICD-10-CM | POA: Diagnosis not present

## 2015-12-26 DIAGNOSIS — I110 Hypertensive heart disease with heart failure: Secondary | ICD-10-CM | POA: Diagnosis not present

## 2015-12-26 DIAGNOSIS — I739 Peripheral vascular disease, unspecified: Secondary | ICD-10-CM | POA: Diagnosis not present

## 2015-12-26 DIAGNOSIS — J45909 Unspecified asthma, uncomplicated: Secondary | ICD-10-CM | POA: Diagnosis not present

## 2015-12-26 NOTE — Telephone Encounter (Signed)
Lori with [facility] is requesting a verbal order for 1 time a week for 1 week and 2 times a week for 3 weeks.  CB#[id number]

## 2015-12-26 NOTE — Telephone Encounter (Signed)
Please review. Thanks!  

## 2015-12-26 NOTE — Telephone Encounter (Signed)
Aldrin would like verbal orders for physical therapy for pt twice a week for 5 weeks. Thanks TNP

## 2015-12-27 NOTE — Telephone Encounter (Signed)
Aldrin notified PT is approved.

## 2015-12-27 NOTE — Telephone Encounter (Signed)
OK for PT as below.  

## 2015-12-27 NOTE — Telephone Encounter (Signed)
Lori notified PT approved.

## 2015-12-27 NOTE — Telephone Encounter (Signed)
Ok for PT 

## 2015-12-28 ENCOUNTER — Other Ambulatory Visit: Payer: Self-pay | Admitting: *Deleted

## 2015-12-28 DIAGNOSIS — J9612 Chronic respiratory failure with hypercapnia: Secondary | ICD-10-CM | POA: Diagnosis not present

## 2015-12-28 DIAGNOSIS — I5030 Unspecified diastolic (congestive) heart failure: Secondary | ICD-10-CM | POA: Diagnosis not present

## 2015-12-28 DIAGNOSIS — I110 Hypertensive heart disease with heart failure: Secondary | ICD-10-CM | POA: Diagnosis not present

## 2015-12-28 DIAGNOSIS — J45909 Unspecified asthma, uncomplicated: Secondary | ICD-10-CM | POA: Diagnosis not present

## 2015-12-28 DIAGNOSIS — J9611 Chronic respiratory failure with hypoxia: Secondary | ICD-10-CM | POA: Diagnosis not present

## 2015-12-28 DIAGNOSIS — J441 Chronic obstructive pulmonary disease with (acute) exacerbation: Secondary | ICD-10-CM | POA: Diagnosis not present

## 2015-12-28 DIAGNOSIS — I739 Peripheral vascular disease, unspecified: Secondary | ICD-10-CM | POA: Diagnosis not present

## 2015-12-28 DIAGNOSIS — R1310 Dysphagia, unspecified: Secondary | ICD-10-CM | POA: Diagnosis not present

## 2015-12-28 DIAGNOSIS — G609 Hereditary and idiopathic neuropathy, unspecified: Secondary | ICD-10-CM | POA: Diagnosis not present

## 2015-12-28 NOTE — Patient Outreach (Signed)
[facility]) Care Management  12/28/2015  Cheryl [name] [birth year] [id number]  Phone call to patient to follow up on social work needs, post discharge from the skilled nursing facility.  Per patient she is doing well.  Patient discussed having to sit up straight to eat and drink to avoid the food going into her lungs.  Patient  States that she is receiving HH through [facility].  Patient reports having her medications and transportation to her medical appointments.  Supportive family described.  Patient verbalized having no social work needs at this time.  Contact information provided.    Plan:  Patient case to be closed to social work at this time.    [name] [facility] Care Management [phone]

## 2016-01-01 DIAGNOSIS — G609 Hereditary and idiopathic neuropathy, unspecified: Secondary | ICD-10-CM | POA: Diagnosis not present

## 2016-01-01 DIAGNOSIS — J45909 Unspecified asthma, uncomplicated: Secondary | ICD-10-CM | POA: Diagnosis not present

## 2016-01-01 DIAGNOSIS — I5030 Unspecified diastolic (congestive) heart failure: Secondary | ICD-10-CM | POA: Diagnosis not present

## 2016-01-01 DIAGNOSIS — J9611 Chronic respiratory failure with hypoxia: Secondary | ICD-10-CM | POA: Diagnosis not present

## 2016-01-01 DIAGNOSIS — J441 Chronic obstructive pulmonary disease with (acute) exacerbation: Secondary | ICD-10-CM | POA: Diagnosis not present

## 2016-01-01 DIAGNOSIS — J9612 Chronic respiratory failure with hypercapnia: Secondary | ICD-10-CM | POA: Diagnosis not present

## 2016-01-01 DIAGNOSIS — I110 Hypertensive heart disease with heart failure: Secondary | ICD-10-CM | POA: Diagnosis not present

## 2016-01-01 DIAGNOSIS — I739 Peripheral vascular disease, unspecified: Secondary | ICD-10-CM | POA: Diagnosis not present

## 2016-01-01 DIAGNOSIS — R1310 Dysphagia, unspecified: Secondary | ICD-10-CM | POA: Diagnosis not present

## 2016-01-02 DIAGNOSIS — J9611 Chronic respiratory failure with hypoxia: Secondary | ICD-10-CM | POA: Diagnosis not present

## 2016-01-02 DIAGNOSIS — J9612 Chronic respiratory failure with hypercapnia: Secondary | ICD-10-CM | POA: Diagnosis not present

## 2016-01-02 DIAGNOSIS — J45909 Unspecified asthma, uncomplicated: Secondary | ICD-10-CM | POA: Diagnosis not present

## 2016-01-02 DIAGNOSIS — R1310 Dysphagia, unspecified: Secondary | ICD-10-CM | POA: Diagnosis not present

## 2016-01-02 DIAGNOSIS — I5030 Unspecified diastolic (congestive) heart failure: Secondary | ICD-10-CM | POA: Diagnosis not present

## 2016-01-02 DIAGNOSIS — I739 Peripheral vascular disease, unspecified: Secondary | ICD-10-CM | POA: Diagnosis not present

## 2016-01-02 DIAGNOSIS — G609 Hereditary and idiopathic neuropathy, unspecified: Secondary | ICD-10-CM | POA: Diagnosis not present

## 2016-01-02 DIAGNOSIS — I110 Hypertensive heart disease with heart failure: Secondary | ICD-10-CM | POA: Diagnosis not present

## 2016-01-02 DIAGNOSIS — J441 Chronic obstructive pulmonary disease with (acute) exacerbation: Secondary | ICD-10-CM | POA: Diagnosis not present

## 2016-01-03 ENCOUNTER — Other Ambulatory Visit: Payer: Self-pay | Admitting: *Deleted

## 2016-01-04 DIAGNOSIS — I110 Hypertensive heart disease with heart failure: Secondary | ICD-10-CM | POA: Diagnosis not present

## 2016-01-04 DIAGNOSIS — J45909 Unspecified asthma, uncomplicated: Secondary | ICD-10-CM | POA: Diagnosis not present

## 2016-01-04 DIAGNOSIS — J9612 Chronic respiratory failure with hypercapnia: Secondary | ICD-10-CM | POA: Diagnosis not present

## 2016-01-04 DIAGNOSIS — R1310 Dysphagia, unspecified: Secondary | ICD-10-CM | POA: Diagnosis not present

## 2016-01-04 DIAGNOSIS — J9611 Chronic respiratory failure with hypoxia: Secondary | ICD-10-CM | POA: Diagnosis not present

## 2016-01-04 DIAGNOSIS — G609 Hereditary and idiopathic neuropathy, unspecified: Secondary | ICD-10-CM | POA: Diagnosis not present

## 2016-01-04 DIAGNOSIS — I5030 Unspecified diastolic (congestive) heart failure: Secondary | ICD-10-CM | POA: Diagnosis not present

## 2016-01-04 DIAGNOSIS — J441 Chronic obstructive pulmonary disease with (acute) exacerbation: Secondary | ICD-10-CM | POA: Diagnosis not present

## 2016-01-04 DIAGNOSIS — I739 Peripheral vascular disease, unspecified: Secondary | ICD-10-CM | POA: Diagnosis not present

## 2016-01-04 NOTE — Patient Outreach (Signed)
RNCM called pt as part of transition of care program. [facility] services explained and consent received verbally from pt. Transition of care flow-sheet completed. RNCM made an appointment to make a home visit with pt. Discussed with pt the need for a scale and pt stated she needed a higher toilet seat because it was hard to get up off the toilet. Pt stated she was much better and was getting better every day. Pt stated she still had home health in. Pt stated husband still managing her medications.    Plan: RNCM will visit pt as continued part of the transition of care program.  Ma Rings Saketh Daubert RN, BSN  [facility] Care Management [phone]

## 2016-01-05 ENCOUNTER — Other Ambulatory Visit: Payer: Self-pay | Admitting: *Deleted

## 2016-01-05 ENCOUNTER — Other Ambulatory Visit: Payer: Self-pay

## 2016-01-05 ENCOUNTER — Encounter: Payer: Self-pay | Admitting: *Deleted

## 2016-01-05 DIAGNOSIS — J9611 Chronic respiratory failure with hypoxia: Secondary | ICD-10-CM | POA: Diagnosis not present

## 2016-01-05 DIAGNOSIS — G609 Hereditary and idiopathic neuropathy, unspecified: Secondary | ICD-10-CM | POA: Diagnosis not present

## 2016-01-05 DIAGNOSIS — J441 Chronic obstructive pulmonary disease with (acute) exacerbation: Secondary | ICD-10-CM | POA: Diagnosis not present

## 2016-01-05 DIAGNOSIS — J9612 Chronic respiratory failure with hypercapnia: Secondary | ICD-10-CM | POA: Diagnosis not present

## 2016-01-05 NOTE — Patient Outreach (Signed)
[facility]) Care Management   01/05/2016  Cheryl [name] [birth year] [id number]  Cheryl [name] is an 65 y.o. female  Subjective: "I am doing so much better now, I was getting pneumonia because my food was going in my lungs when I ate laying back."   Objective: Blood pressure 122/62, pulse 80, resp. rate 18, height 1.549 m ( ), weight 99 lb (44.906 kg), SpO2 97 % on room air.   Review of Systems  Constitutional: Positive for weight loss.  HENT: Negative for congestion.   Respiratory: Negative for cough.   Cardiovascular: Negative for chest pain.  Gastrointestinal: Positive for diarrhea.  Musculoskeletal: Positive for back pain, joint pain and falls.  All other systems reviewed and are negative.   Physical Exam  Constitutional: She appears cachectic.  Cardiovascular: A regularly irregular rhythm present.  Pulses:      Radial pulses are 2+ on the right side, and 2+ on the left side.       Dorsalis pedis pulses are 1+ on the right side, and 1+ on the left side.  Respiratory: Breath sounds normal.  Neurological: She is alert.  Psychiatric: She has a normal mood and affect. Her speech is normal and behavior is normal. Judgment and thought content normal. She exhibits abnormal recent memory.    Current Medications:   Current Outpatient Prescriptions  Medication Sig Dispense Refill  . acetaminophen (TYLENOL) 500 MG tablet Take 1,000 mg by mouth every 6 (six) hours as needed for mild pain or headache.     . albuterol (PROVENTIL HFA;VENTOLIN HFA) 108 (90 BASE) MCG/ACT inhaler Inhale 2 puffs into the lungs every 4 (four) hours as needed for wheezing or shortness of breath. 18 g 3  . albuterol (PROVENTIL) (2.5 MG/3ML) 0.083% nebulizer solution Take 3 mLs (2.5 mg total) by nebulization every 4 (four) hours as needed for wheezing. 75 mL 12  . ALPRAZolam (XANAX) 0.5 MG tablet Take 0.5 mg by mouth every 4 (four) hours as needed for anxiety.     . benzonatate  (TESSALON) 200 MG capsule Take 1 capsule (200 mg total) by mouth 3 (three) times daily as needed for cough. 20 capsule 0  . clopidogrel (PLAVIX) 75 MG tablet Take 75 mg by mouth daily.    . diphenoxylate-atropine (LOMOTIL) 2.5-0.025 MG per tablet Take 2 tablets by mouth 4 (four) times daily as needed for diarrhea or loose stools.     . donepezil (ARICEPT) 10 MG tablet Take 10 mg by mouth at bedtime.    Marland Kitchen doxycycline (VIBRAMYCIN) 100 MG capsule Take 1 capsule (100 mg total) by mouth 2 (two) times daily. 20 capsule 0  . estropipate (OGEN) 0.75 MG tablet Take 1 tablet (0.75 mg total) by mouth daily. 30 tablet 6  . fentaNYL (DURAGESIC - DOSED MCG/HR) 50 MCG/HR Place 1 patch (50 mcg total) onto the skin every 3 (three) days. 10 patch 0  . Fluticasone-Salmeterol (ADVAIR) 250-50 MCG/DOSE AEPB Inhale 1 puff into the lungs 2 (two) times daily.    . furosemide (LASIX) 40 MG tablet Take 40 mg by mouth daily.    . pantoprazole (PROTONIX) 40 MG tablet Take 40 mg by mouth daily.     . pregabalin (LYRICA) 150 MG capsule Take 1 capsule (150 mg total) by mouth 2 (two) times daily. 60 capsule 2  . simvastatin (ZOCOR) 10 MG tablet Take 10 mg by mouth at bedtime. Reported on 12/04/2015    . SUMAtriptan (IMITREX) 25 MG tablet Take 25 mg  by mouth as needed for migraine. May repeat in 2 hours if headache persists or recurs.    . tiotropium (SPIRIVA) Marland Kitchen18 MCG inhalation capsule Place 1 capsule (18 mcg total) into inhaler and inhale daily. 30 capsule 12  . traMADol (ULTRAM) 50 MG tablet Take 100 mg by mouth 4 (four) times daily.    Marland Kitchen dronabinol (MARINOL) 2.5 MG capsule Take 1 capsule (2.5 mg total) by mouth 2 (two) times daily before lunch and supper. (Patient not taking: Reported on 01/05/2016) 60 capsule 1  . guaiFENesin-dextromethorphan (ROBITUSSIN DM) 100-10 MG/5ML syrup Take 5 mLs by mouth every 4 (four) hours as needed for cough. (Patient not taking: Reported on 01/05/2016) 118 mL 0   No current facility-administered  medications for this visit.    Functional Status:   In your present state of health, do you have any difficulty performing the following activities: 01/05/2016 11/19/2015  Hearing? N N  Vision? N N  Difficulty concentrating or making decisions? N N  Walking or climbing stairs? Y Y  Dressing or bathing? Y Y  Doing errands, shopping? [name]  Preparing Food and eating ? Y -  Using the Toilet? Y -  In the past six months, have you accidently leaked urine? Y -  Do you have problems with loss of bowel control? Y -  Managing your Medications? Y -  Managing your Finances? Y -  Housekeeping or managing your Housekeeping? Y -    Fall/Depression Screening:    PHQ 2/9 Scores 01/05/2016 12/04/2015 11/03/2015 09/13/2015 09/01/2015 07/13/2015 06/06/2015  PHQ - 2 Score 0 0 0 0 0 0 0   Fall Risk  01/05/2016 12/04/2015 11/03/2015 09/13/2015 09/04/2015  Falls in the past year? Yes No Yes Yes Yes  Number falls in past yr: 2 or more - 2 or more 2 or more 2 or more  Injury with Fall? Yes - No No No  Risk Factor Category  High Fall Risk - - High Fall Risk High Fall Risk  Risk for fall due to : History of fall(s);Impaired balance/gait;Impaired mobility;Medication side effect - - History of fall(s) -  Follow up Falls evaluation completed;Falls prevention discussed - - Education provided;Falls prevention discussed Education provided;Falls prevention discussed   Assessment:  Initial home visit, pleasant elderly pt. Pt's home noted to be very clean and in good repair. Pt ambulating with a walker but noted to use furniture to walk too. Several times she looked for something on the floor and RNCM had to assist her up. Pt does have Med alert to call for help her if she were to fall. Pt was talking about getting ready to clean her laundry room with bleach and RNCM educated pt to be careful with the chemicals that could harm her breathing. Pt verbalized understanding. RNCM educated pt on the need for follow up with her primary care  MD.  Pt has all medications, and pt continues to drive so transportation not an issue. Pt complained of back pain when trying to get up from toilet, RNCM provided pt with a raised toilet seat.   Weight loss: Pt stating her appetite had improved and she was eating more now that she was home from the SNF. Pt's weight was 104 pounds 10/16 but is now 99 pounds on home scale with robe. Pt's weight in January was 95 pounds. Pt does not like ensure or milk based products. RNCM and pt discussed ways to add calories to her diet. Pt also stated she struggles with diarrhea.  Talked with pt about adding fiber to her diet to assist with the diarrhea but pt stated she mostly eats junk food and if someone sprinkled fiber in her food she would not like it. RNCM provided pt with a digitial scale for monitoring weights.  COPD: Pt reports using inhalers as directed. Lungs clear at this time. COPD zones reviewed, pt stated she knew when to call the doctor. Denies productive cough at this time.   RNCM explained to pt another RNCM will be following her for transition of care calls related to this RNCM is changing positions. Confirmed pt has [facility] contact information including 24hr nurse line.    Plan: RNCM will give report to receiving RNCM related to pt needing primary care f/u.   Faustino Luecke RN, BSN  [facility] Care Management [phone])   [facility] CM Care Plan Problem One        Most Recent Value   Care Plan Problem One  Pt does not want to go back into the hospital    Role Documenting the Problem One  Care Management Coordinator   Care Plan for Problem One  Active   [facility] Long Term Goal (31-90 days)  Pt will not readmit to acute care setting for 31 days   [facility] Long Term Goal Start Date  01/03/16   Interventions for Problem One Long Term Goal  Transition of care program started, visit scheduled.   [facility] CM Short Term Goal #1 (0-30 days)  Pt will weigh every day and record weight to monitor for weight loss for the next 30 days.    [facility] CM Short Term Goal #1 Start Date  01/03/16   Interventions for Short Term Goal #1  RNCM provided pt with a scale, RNCM educated pt on ways to add calories to her diet, RNCM called to order Well Dine meals from [facility] and noteds this order had already been placed.    [facility] CM Short Term Goal #2 (0-30 days)  Pt will make f/u appointment with primary care doctor in the next 7 days   [facility] CM Short Term Goal #2 Start Date  01/05/16   Interventions for Short Term Goal #2  RNCM educated pt on the importance of following up with primary MD.

## 2016-01-09 DIAGNOSIS — I5030 Unspecified diastolic (congestive) heart failure: Secondary | ICD-10-CM | POA: Diagnosis not present

## 2016-01-09 DIAGNOSIS — J9612 Chronic respiratory failure with hypercapnia: Secondary | ICD-10-CM | POA: Diagnosis not present

## 2016-01-09 DIAGNOSIS — R1310 Dysphagia, unspecified: Secondary | ICD-10-CM | POA: Diagnosis not present

## 2016-01-09 DIAGNOSIS — I110 Hypertensive heart disease with heart failure: Secondary | ICD-10-CM | POA: Diagnosis not present

## 2016-01-09 DIAGNOSIS — J9611 Chronic respiratory failure with hypoxia: Secondary | ICD-10-CM | POA: Diagnosis not present

## 2016-01-09 DIAGNOSIS — I739 Peripheral vascular disease, unspecified: Secondary | ICD-10-CM | POA: Diagnosis not present

## 2016-01-09 DIAGNOSIS — J441 Chronic obstructive pulmonary disease with (acute) exacerbation: Secondary | ICD-10-CM | POA: Diagnosis not present

## 2016-01-09 DIAGNOSIS — G609 Hereditary and idiopathic neuropathy, unspecified: Secondary | ICD-10-CM | POA: Diagnosis not present

## 2016-01-09 DIAGNOSIS — J45909 Unspecified asthma, uncomplicated: Secondary | ICD-10-CM | POA: Diagnosis not present

## 2016-01-10 ENCOUNTER — Encounter: Payer: Self-pay | Admitting: Family Medicine

## 2016-01-10 ENCOUNTER — Ambulatory Visit (INDEPENDENT_AMBULATORY_CARE_PROVIDER_SITE_OTHER): Payer: Medicare HMO | Admitting: Family Medicine

## 2016-01-10 VITALS — BP 120/58 | HR 78 | Temp 98.2°F | Resp 14 | Ht 61.0 in | Wt 101.0 lb

## 2016-01-10 DIAGNOSIS — E559 Vitamin D deficiency, unspecified: Secondary | ICD-10-CM

## 2016-01-10 DIAGNOSIS — D638 Anemia in other chronic diseases classified elsewhere: Secondary | ICD-10-CM

## 2016-01-10 DIAGNOSIS — I739 Peripheral vascular disease, unspecified: Secondary | ICD-10-CM | POA: Diagnosis not present

## 2016-01-10 DIAGNOSIS — E440 Moderate protein-calorie malnutrition: Secondary | ICD-10-CM

## 2016-01-10 DIAGNOSIS — R1310 Dysphagia, unspecified: Secondary | ICD-10-CM

## 2016-01-10 DIAGNOSIS — J9611 Chronic respiratory failure with hypoxia: Secondary | ICD-10-CM | POA: Diagnosis not present

## 2016-01-10 DIAGNOSIS — D72829 Elevated white blood cell count, unspecified: Secondary | ICD-10-CM

## 2016-01-10 DIAGNOSIS — E7800 Pure hypercholesterolemia, unspecified: Secondary | ICD-10-CM | POA: Diagnosis not present

## 2016-01-10 DIAGNOSIS — J441 Chronic obstructive pulmonary disease with (acute) exacerbation: Secondary | ICD-10-CM | POA: Diagnosis not present

## 2016-01-10 DIAGNOSIS — J45909 Unspecified asthma, uncomplicated: Secondary | ICD-10-CM | POA: Diagnosis not present

## 2016-01-10 DIAGNOSIS — G609 Hereditary and idiopathic neuropathy, unspecified: Secondary | ICD-10-CM | POA: Diagnosis not present

## 2016-01-10 DIAGNOSIS — J9612 Chronic respiratory failure with hypercapnia: Secondary | ICD-10-CM | POA: Diagnosis not present

## 2016-01-10 DIAGNOSIS — I110 Hypertensive heart disease with heart failure: Secondary | ICD-10-CM | POA: Diagnosis not present

## 2016-01-10 DIAGNOSIS — I5030 Unspecified diastolic (congestive) heart failure: Secondary | ICD-10-CM | POA: Diagnosis not present

## 2016-01-10 NOTE — Progress Notes (Signed)
Patient: Cheryl [name] Female    DOB: February 15, 1951   65 y.o.   MRN: [id number] Visit Date: 01/10/2016  Today's Provider: Mila Merry, MD   Chief Complaint  Patient presents with  . Follow-up  . Pneumonia   Subjective:    HPI  Patient was admitted in in November and twice in December to  [facility]  for hospital acquired pneumonia and aspiration pneumonitis.  She was also diagnosed with diastolic heart failure. She has follow up scheduled with Dr, Welton Flakes next week. She was discharged from [facility] to [place], and to home about 2 weeks ago. She has had no trouble with her breathing since discharge. Has had no swelling. She is seeing Dr. Shireen Quan for back pain, which states has worsened since he took her off of Celebrex.   Allergies  Allergen Reactions  . Percocet [Oxycodone-Acetaminophen] Hives  . Aspirin Nausea And Vomiting and Other (See Comments)    Reaction:  GI upset   . Codeine Nausea And Vomiting and Other (See Comments)    Reaction:  GI upset   . Morphine Other (See Comments)    Reaction:  Unknown   . Pregabalin Other (See Comments)    Reaction:  Makes pt sleep excessively.    . Propoxyphene Other (See Comments)    Reaction:  GI upset   . Sulfa Antibiotics Rash and Other (See Comments)    Reaction:  GI upset    Previous Medications   ACETAMINOPHEN (TYLENOL) 500 MG TABLET    Take 1,000 mg by mouth every 6 (six) hours as needed for mild pain or headache.    ALBUTEROL (PROVENTIL HFA;VENTOLIN HFA) 108 (90 BASE) MCG/ACT INHALER    Inhale 2 puffs into the lungs every 4 (four) hours as needed for wheezing or shortness of breath.   ALBUTEROL (PROVENTIL) (2.5 MG/3ML) 0.083% NEBULIZER SOLUTION    Take 3 mLs (2.5 mg total) by nebulization every 4 (four) hours as needed for wheezing.   ALPRAZOLAM (XANAX) 0.5 MG TABLET    Take 0.5 mg by mouth every 4 (four) hours as needed for anxiety.    CLOPIDOGREL (PLAVIX) 75 MG TABLET    Take 75 mg by mouth daily.   DIPHENOXYLATE-ATROPINE  (LOMOTIL) 2.5-0.025 MG PER TABLET    Take 2 tablets by mouth 4 (four) times daily as needed for diarrhea or loose stools.    DONEPEZIL (ARICEPT) 10 MG TABLET    Take 10 mg by mouth at bedtime.   DRONABINOL (MARINOL) 2.5 MG CAPSULE    Take 1 capsule (2.5 mg total) by mouth 2 (two) times daily before lunch and supper.   ESTROPIPATE (OGEN) 0.75 MG TABLET    Take 1 tablet (0.75 mg total) by mouth daily.   FENTANYL (DURAGESIC - DOSED MCG/HR) 50 MCG/HR    Place 1 patch (50 mcg total) onto the skin every 3 (three) days.   FLUTICASONE-SALMETEROL (ADVAIR) 250-50 MCG/DOSE AEPB    Inhale 1 puff into the lungs 2 (two) times daily.   FUROSEMIDE (LASIX) 40 MG TABLET    Take 40 mg by mouth daily.   GUAIFENESIN-DEXTROMETHORPHAN (ROBITUSSIN DM) 100-10 MG/5ML SYRUP    Take 5 mLs by mouth every 4 (four) hours as needed for cough.   PANTOPRAZOLE (PROTONIX) 40 MG TABLET    Take 40 mg by mouth daily.    PREGABALIN (LYRICA) 150 MG CAPSULE    Take 1 capsule (150 mg total) by mouth 2 (two) times daily.   SIMVASTATIN (ZOCOR) 10  MG TABLET    Take 10 mg by mouth at bedtime. Reported on 12/04/2015   SUMATRIPTAN (IMITREX) 25 MG TABLET    Take 25 mg by mouth as needed for migraine. May repeat in 2 hours if headache persists or recurs.   TIOTROPIUM (SPIRIVA) 18 MCG INHALATION CAPSULE    Place 1 capsule (18 mcg total) into inhaler and inhale daily.   TRAMADOL (ULTRAM) 50 MG TABLET    Take 100 mg by mouth 4 (four) times daily.    Review of Systems  Constitutional: Negative for chills and fatigue.  Respiratory: Positive for cough and shortness of breath. Negative for chest tightness.   Cardiovascular: Negative for palpitations.  Gastrointestinal: Negative for nausea, vomiting and abdominal pain.  Neurological: Negative for dizziness and weakness.    Social History  Substance Use Topics  . Smoking status: Current Every Day Smoker -- 1.00 packs/day for 50 years    Types: Cigarettes  . Smokeless tobacco: Not on file  . Alcohol  Use: No   Objective:   BP 120/58 mmHg  Pulse 78  Temp(Src) 98.2 F (36.8 C) (Oral)  Resp 14  Ht  (1.549 m)  Wt 101 lb (45.813 kg)  BMI 19.09 kg/m2  SpO2 94%  Physical Exam   General Appearance:    Alert, cooperative, no distress  Eyes:    PERRL, conjunctiva/corneas clear, EOM's intact       Lungs:     Diffuse expiratory wheezes, respirations unlabored, normal air movement  Heart:    Regular rate and rhythm  Neurologic:   Awake, alert, oriented x 3. No apparent focal neurological           defect.           Assessment & Plan:     1. Dysphagia She is followed by Dr. Bluford Kaufmann and advised to follow up and discuss EGD. Recent episode of pneumonia was thought to be exacerbated by aspiration pneumonitis.   2. Leukocytosis  - CBC with Differential/Platelet  3. Anemia of chronic disease  - CBC with Differential/Platelet  4. Malnutrition of moderate degree  - Comprehensive metabolic panel  5. Hypercholesteremia  - Lipid panel  6. Vitamin D deficiency  - VITAMIN D 25 Hydroxy (Vit-D Deficiency, Fractures)       Mila Merry, MD  [facility]

## 2016-01-11 ENCOUNTER — Other Ambulatory Visit: Payer: Self-pay | Admitting: *Deleted

## 2016-01-11 DIAGNOSIS — J441 Chronic obstructive pulmonary disease with (acute) exacerbation: Secondary | ICD-10-CM | POA: Diagnosis not present

## 2016-01-11 DIAGNOSIS — G609 Hereditary and idiopathic neuropathy, unspecified: Secondary | ICD-10-CM | POA: Diagnosis not present

## 2016-01-11 DIAGNOSIS — I739 Peripheral vascular disease, unspecified: Secondary | ICD-10-CM | POA: Diagnosis not present

## 2016-01-11 DIAGNOSIS — I5030 Unspecified diastolic (congestive) heart failure: Secondary | ICD-10-CM | POA: Diagnosis not present

## 2016-01-11 DIAGNOSIS — J9612 Chronic respiratory failure with hypercapnia: Secondary | ICD-10-CM | POA: Diagnosis not present

## 2016-01-11 DIAGNOSIS — I110 Hypertensive heart disease with heart failure: Secondary | ICD-10-CM | POA: Diagnosis not present

## 2016-01-11 DIAGNOSIS — J9611 Chronic respiratory failure with hypoxia: Secondary | ICD-10-CM | POA: Diagnosis not present

## 2016-01-11 DIAGNOSIS — R1310 Dysphagia, unspecified: Secondary | ICD-10-CM | POA: Diagnosis not present

## 2016-01-11 DIAGNOSIS — J45909 Unspecified asthma, uncomplicated: Secondary | ICD-10-CM | POA: Diagnosis not present

## 2016-01-11 NOTE — Patient Outreach (Signed)
[facility]) Care Management  01/11/2016  Cheryl [name] 01-23-1951 [id number]  Transition of care call week #3 [name] reports that she is doing fine, up doing her house cleaning, when asked about what zone she was in, she asked that I wait while she looked at chart on her refrigerator because she couldn't remember, then states she is in the green zone. [name] reporting tolerating her diet well,sitting up to eat and staying up for at least a hour after she eats. Patient reports that she weighting and her weight is up to 101 pounds.   [name] has had follow up appointment with her PCP, and states that she is still followed by home health RN and physical therapy, has Physical therapy appointment this pm.   Patient denies any new concern, I have provided her with my contact information for needs.  Plan Patient will continue in transition of care program, phone call scheduled for 2/22.  Egbert Garibaldi, RN, [facility] Care Management [phone]- Mobile [phone]- Toll Free Main Office

## 2016-01-12 DIAGNOSIS — M6281 Muscle weakness (generalized): Secondary | ICD-10-CM | POA: Diagnosis not present

## 2016-01-12 DIAGNOSIS — J449 Chronic obstructive pulmonary disease, unspecified: Secondary | ICD-10-CM | POA: Diagnosis not present

## 2016-01-15 DIAGNOSIS — I739 Peripheral vascular disease, unspecified: Secondary | ICD-10-CM | POA: Diagnosis not present

## 2016-01-15 DIAGNOSIS — I5030 Unspecified diastolic (congestive) heart failure: Secondary | ICD-10-CM | POA: Diagnosis not present

## 2016-01-15 DIAGNOSIS — J441 Chronic obstructive pulmonary disease with (acute) exacerbation: Secondary | ICD-10-CM | POA: Diagnosis not present

## 2016-01-15 DIAGNOSIS — J9611 Chronic respiratory failure with hypoxia: Secondary | ICD-10-CM | POA: Diagnosis not present

## 2016-01-15 DIAGNOSIS — I110 Hypertensive heart disease with heart failure: Secondary | ICD-10-CM | POA: Diagnosis not present

## 2016-01-15 DIAGNOSIS — J9612 Chronic respiratory failure with hypercapnia: Secondary | ICD-10-CM | POA: Diagnosis not present

## 2016-01-15 DIAGNOSIS — J45909 Unspecified asthma, uncomplicated: Secondary | ICD-10-CM | POA: Diagnosis not present

## 2016-01-15 DIAGNOSIS — R1310 Dysphagia, unspecified: Secondary | ICD-10-CM | POA: Diagnosis not present

## 2016-01-15 DIAGNOSIS — G609 Hereditary and idiopathic neuropathy, unspecified: Secondary | ICD-10-CM | POA: Diagnosis not present

## 2016-01-16 DIAGNOSIS — G609 Hereditary and idiopathic neuropathy, unspecified: Secondary | ICD-10-CM | POA: Diagnosis not present

## 2016-01-16 DIAGNOSIS — R1310 Dysphagia, unspecified: Secondary | ICD-10-CM | POA: Diagnosis not present

## 2016-01-16 DIAGNOSIS — I739 Peripheral vascular disease, unspecified: Secondary | ICD-10-CM | POA: Diagnosis not present

## 2016-01-16 DIAGNOSIS — J441 Chronic obstructive pulmonary disease with (acute) exacerbation: Secondary | ICD-10-CM | POA: Diagnosis not present

## 2016-01-16 DIAGNOSIS — J9611 Chronic respiratory failure with hypoxia: Secondary | ICD-10-CM | POA: Diagnosis not present

## 2016-01-16 DIAGNOSIS — J45909 Unspecified asthma, uncomplicated: Secondary | ICD-10-CM | POA: Diagnosis not present

## 2016-01-16 DIAGNOSIS — I110 Hypertensive heart disease with heart failure: Secondary | ICD-10-CM | POA: Diagnosis not present

## 2016-01-16 DIAGNOSIS — J9612 Chronic respiratory failure with hypercapnia: Secondary | ICD-10-CM | POA: Diagnosis not present

## 2016-01-16 DIAGNOSIS — I5030 Unspecified diastolic (congestive) heart failure: Secondary | ICD-10-CM | POA: Diagnosis not present

## 2016-01-17 ENCOUNTER — Other Ambulatory Visit: Payer: Self-pay | Admitting: *Deleted

## 2016-01-17 DIAGNOSIS — R110 Nausea: Secondary | ICD-10-CM | POA: Diagnosis not present

## 2016-01-17 DIAGNOSIS — I739 Peripheral vascular disease, unspecified: Secondary | ICD-10-CM | POA: Diagnosis not present

## 2016-01-17 DIAGNOSIS — J45909 Unspecified asthma, uncomplicated: Secondary | ICD-10-CM | POA: Diagnosis not present

## 2016-01-17 DIAGNOSIS — G609 Hereditary and idiopathic neuropathy, unspecified: Secondary | ICD-10-CM | POA: Diagnosis not present

## 2016-01-17 DIAGNOSIS — R1310 Dysphagia, unspecified: Secondary | ICD-10-CM | POA: Diagnosis not present

## 2016-01-17 DIAGNOSIS — D72829 Elevated white blood cell count, unspecified: Secondary | ICD-10-CM | POA: Diagnosis not present

## 2016-01-17 DIAGNOSIS — J9611 Chronic respiratory failure with hypoxia: Secondary | ICD-10-CM | POA: Diagnosis not present

## 2016-01-17 DIAGNOSIS — I110 Hypertensive heart disease with heart failure: Secondary | ICD-10-CM | POA: Diagnosis not present

## 2016-01-17 DIAGNOSIS — J449 Chronic obstructive pulmonary disease, unspecified: Secondary | ICD-10-CM | POA: Diagnosis not present

## 2016-01-17 DIAGNOSIS — N390 Urinary tract infection, site not specified: Secondary | ICD-10-CM | POA: Diagnosis not present

## 2016-01-17 DIAGNOSIS — D638 Anemia in other chronic diseases classified elsewhere: Secondary | ICD-10-CM | POA: Diagnosis not present

## 2016-01-17 DIAGNOSIS — I5030 Unspecified diastolic (congestive) heart failure: Secondary | ICD-10-CM | POA: Diagnosis not present

## 2016-01-17 DIAGNOSIS — E440 Moderate protein-calorie malnutrition: Secondary | ICD-10-CM | POA: Diagnosis not present

## 2016-01-17 DIAGNOSIS — E559 Vitamin D deficiency, unspecified: Secondary | ICD-10-CM | POA: Diagnosis not present

## 2016-01-17 DIAGNOSIS — J441 Chronic obstructive pulmonary disease with (acute) exacerbation: Secondary | ICD-10-CM | POA: Diagnosis not present

## 2016-01-17 DIAGNOSIS — E7800 Pure hypercholesterolemia, unspecified: Secondary | ICD-10-CM | POA: Diagnosis not present

## 2016-01-17 DIAGNOSIS — J9612 Chronic respiratory failure with hypercapnia: Secondary | ICD-10-CM | POA: Diagnosis not present

## 2016-01-17 NOTE — Patient Outreach (Signed)
[facility]) Care Management  01/17/2016  Antwanette Wesche 06/29/1951 [id number]  Transition of care week #4  RNCM spoke with patient, she reports that she is doing fine, breathing is in the green zone denies shortness of breath. Patient reports having a good appetite and improved intake, and states that she continues to sit up straight to eat and stay upright for at least a hour after eating, weight is still 101, patient is not writing weights done encouraged to do so, to track small changes.  Patient reports taking daily medications as prescribed, still active with home health. Patient denies any further needs or concerns at this time. Patient she is getting ready to go out to get lab work done, and then lunch with family.   Plan Will continue with transition of care program call on 3/1 Will Schedule home visit on 3/17 at 12 noon prior to transition to health as discussed with patient and she is agreeable.  Egbert Garibaldi, RN, [facility] Care Management [phone] Mobile [phone]- Toll Free Main Office

## 2016-01-18 DIAGNOSIS — J9612 Chronic respiratory failure with hypercapnia: Secondary | ICD-10-CM | POA: Diagnosis not present

## 2016-01-18 DIAGNOSIS — G609 Hereditary and idiopathic neuropathy, unspecified: Secondary | ICD-10-CM | POA: Diagnosis not present

## 2016-01-18 DIAGNOSIS — R1310 Dysphagia, unspecified: Secondary | ICD-10-CM | POA: Diagnosis not present

## 2016-01-18 DIAGNOSIS — I5030 Unspecified diastolic (congestive) heart failure: Secondary | ICD-10-CM | POA: Diagnosis not present

## 2016-01-18 DIAGNOSIS — J9611 Chronic respiratory failure with hypoxia: Secondary | ICD-10-CM | POA: Diagnosis not present

## 2016-01-18 DIAGNOSIS — I739 Peripheral vascular disease, unspecified: Secondary | ICD-10-CM | POA: Diagnosis not present

## 2016-01-18 DIAGNOSIS — J441 Chronic obstructive pulmonary disease with (acute) exacerbation: Secondary | ICD-10-CM | POA: Diagnosis not present

## 2016-01-18 DIAGNOSIS — I110 Hypertensive heart disease with heart failure: Secondary | ICD-10-CM | POA: Diagnosis not present

## 2016-01-18 DIAGNOSIS — J45909 Unspecified asthma, uncomplicated: Secondary | ICD-10-CM | POA: Diagnosis not present

## 2016-01-18 LAB — COMPREHENSIVE METABOLIC PANEL
ALBUMIN: 4.1 g/dL (ref 3.6–4.8)
ALK PHOS: 88 IU/L (ref 39–117)
ALT: 7 IU/L (ref 0–32)
AST: 16 IU/L (ref 0–40)
Albumin/Globulin Ratio: 1.6 (ref 1.1–2.5)
BILIRUBIN TOTAL: 0.2 mg/dL (ref 0.0–1.2)
BUN / CREAT RATIO: 14 (ref 11–26)
BUN: 11 mg/dL (ref 8–27)
CHLORIDE: 96 mmol/L (ref 96–106)
CO2: 27 mmol/L (ref 18–29)
Calcium: 9.3 mg/dL (ref 8.7–10.3)
Creatinine, Ser: 0.8 mg/dL (ref 0.57–1.00)
GFR calc Af Amer: 89 mL/min/{1.73_m2} (ref >59)
GFR calc non Af Amer: 77 mL/min/{1.73_m2} (ref >59)
GLOBULIN, TOTAL: 2.6 g/dL (ref 1.5–4.5)
Glucose: 93 mg/dL (ref 65–99)
POTASSIUM: 4.9 mmol/L (ref 3.5–5.2)
SODIUM: 140 mmol/L (ref 134–144)
Total Protein: 6.7 g/dL (ref 6.0–8.5)

## 2016-01-18 LAB — LIPID PANEL
CHOLESTEROL TOTAL: 138 mg/dL (ref 100–199)
Chol/HDL Ratio: 2 ratio units (ref 0.0–4.4)
HDL: 68 mg/dL (ref >39)
LDL Calculated: 48 mg/dL (ref 0–99)
Triglycerides: 108 mg/dL (ref 0–149)
VLDL Cholesterol Cal: 22 mg/dL (ref 5–40)

## 2016-01-18 LAB — CBC WITH DIFFERENTIAL/PLATELET
BASOS ABS: 0.1 10*3/uL (ref 0.0–0.2)
Basos: 1 %
EOS (ABSOLUTE): 0.1 10*3/uL (ref 0.0–0.4)
EOS: 2 %
HEMATOCRIT: 30.9 % — AB (ref 34.0–46.6)
HEMOGLOBIN: 9.9 g/dL — AB (ref 11.1–15.9)
Immature Grans (Abs): 0 10*3/uL (ref 0.0–0.1)
Immature Granulocytes: 0 %
LYMPHS ABS: 1.2 10*3/uL (ref 0.7–3.1)
Lymphs: 22 %
MCH: 28.6 pg (ref 26.6–33.0)
MCHC: 32 g/dL (ref 31.5–35.7)
MCV: 89 fL (ref 79–97)
MONOCYTES: 9 %
MONOS ABS: 0.5 10*3/uL (ref 0.1–0.9)
NEUTROS ABS: 3.7 10*3/uL (ref 1.4–7.0)
Neutrophils: 66 %
Platelets: 388 10*3/uL — ABNORMAL HIGH (ref 150–379)
RBC: 3.46 x10E6/uL — AB (ref 3.77–5.28)
RDW: 15.5 % — AB (ref 12.3–15.4)
WBC: 5.6 10*3/uL (ref 3.4–10.8)

## 2016-01-18 LAB — VITAMIN D 25 HYDROXY (VIT D DEFICIENCY, FRACTURES): VIT D 25 HYDROXY: 25.6 ng/mL — AB (ref 30.0–100.0)

## 2016-01-19 DIAGNOSIS — I739 Peripheral vascular disease, unspecified: Secondary | ICD-10-CM | POA: Diagnosis not present

## 2016-01-19 DIAGNOSIS — K219 Gastro-esophageal reflux disease without esophagitis: Secondary | ICD-10-CM | POA: Diagnosis not present

## 2016-01-19 DIAGNOSIS — I2510 Atherosclerotic heart disease of native coronary artery without angina pectoris: Secondary | ICD-10-CM | POA: Diagnosis not present

## 2016-01-19 DIAGNOSIS — I10 Essential (primary) hypertension: Secondary | ICD-10-CM | POA: Diagnosis not present

## 2016-01-23 DIAGNOSIS — J9611 Chronic respiratory failure with hypoxia: Secondary | ICD-10-CM | POA: Diagnosis not present

## 2016-01-23 DIAGNOSIS — G609 Hereditary and idiopathic neuropathy, unspecified: Secondary | ICD-10-CM | POA: Diagnosis not present

## 2016-01-23 DIAGNOSIS — I5030 Unspecified diastolic (congestive) heart failure: Secondary | ICD-10-CM | POA: Diagnosis not present

## 2016-01-23 DIAGNOSIS — I739 Peripheral vascular disease, unspecified: Secondary | ICD-10-CM | POA: Diagnosis not present

## 2016-01-23 DIAGNOSIS — J45909 Unspecified asthma, uncomplicated: Secondary | ICD-10-CM | POA: Diagnosis not present

## 2016-01-23 DIAGNOSIS — I110 Hypertensive heart disease with heart failure: Secondary | ICD-10-CM | POA: Diagnosis not present

## 2016-01-23 DIAGNOSIS — J441 Chronic obstructive pulmonary disease with (acute) exacerbation: Secondary | ICD-10-CM | POA: Diagnosis not present

## 2016-01-23 DIAGNOSIS — J9612 Chronic respiratory failure with hypercapnia: Secondary | ICD-10-CM | POA: Diagnosis not present

## 2016-01-23 DIAGNOSIS — R1310 Dysphagia, unspecified: Secondary | ICD-10-CM | POA: Diagnosis not present

## 2016-01-24 ENCOUNTER — Other Ambulatory Visit: Payer: Self-pay | Admitting: *Deleted

## 2016-01-24 DIAGNOSIS — I739 Peripheral vascular disease, unspecified: Secondary | ICD-10-CM | POA: Diagnosis not present

## 2016-01-24 DIAGNOSIS — J9612 Chronic respiratory failure with hypercapnia: Secondary | ICD-10-CM | POA: Diagnosis not present

## 2016-01-24 DIAGNOSIS — I110 Hypertensive heart disease with heart failure: Secondary | ICD-10-CM | POA: Diagnosis not present

## 2016-01-24 DIAGNOSIS — I5030 Unspecified diastolic (congestive) heart failure: Secondary | ICD-10-CM | POA: Diagnosis not present

## 2016-01-24 DIAGNOSIS — J441 Chronic obstructive pulmonary disease with (acute) exacerbation: Secondary | ICD-10-CM | POA: Diagnosis not present

## 2016-01-24 DIAGNOSIS — G609 Hereditary and idiopathic neuropathy, unspecified: Secondary | ICD-10-CM | POA: Diagnosis not present

## 2016-01-24 DIAGNOSIS — J9611 Chronic respiratory failure with hypoxia: Secondary | ICD-10-CM | POA: Diagnosis not present

## 2016-01-24 DIAGNOSIS — J45909 Unspecified asthma, uncomplicated: Secondary | ICD-10-CM | POA: Diagnosis not present

## 2016-01-24 DIAGNOSIS — R1310 Dysphagia, unspecified: Secondary | ICD-10-CM | POA: Diagnosis not present

## 2016-01-24 NOTE — Patient Outreach (Signed)
  [facility]) Care Management  01/24/2016  Francella Barnett [birth year] [id number]   Transition of care call [name] reports that she is doing well on today, just very busy with her great grand kids. Patient reports that her breathing is doing well, denies increased shortness of breath, increased cough, or sputum. Patient is able to state that she know to cal the MD if she has breathing problems. Reviewed yellow zone symptoms to notify MD of . Patient reports taking her medication without problems. [name] report her appetite is improving and she is making sure that she sitting up while and after eating. Patient reports that she is weighing daily, but unable to give me her weight for today.  Plan Will call patient on 3/8 for transition of care call, prior to home visit on 3/17 with plan to transition to health coach.  Egbert Garibaldi, RN, [facility] Care Management [phone]- Mobile [phone]- Toll Free Main Office

## 2016-01-25 DIAGNOSIS — J9611 Chronic respiratory failure with hypoxia: Secondary | ICD-10-CM | POA: Diagnosis not present

## 2016-01-25 DIAGNOSIS — I110 Hypertensive heart disease with heart failure: Secondary | ICD-10-CM | POA: Diagnosis not present

## 2016-01-25 DIAGNOSIS — G609 Hereditary and idiopathic neuropathy, unspecified: Secondary | ICD-10-CM | POA: Diagnosis not present

## 2016-01-25 DIAGNOSIS — I5030 Unspecified diastolic (congestive) heart failure: Secondary | ICD-10-CM | POA: Diagnosis not present

## 2016-01-25 DIAGNOSIS — J45909 Unspecified asthma, uncomplicated: Secondary | ICD-10-CM | POA: Diagnosis not present

## 2016-01-25 DIAGNOSIS — I739 Peripheral vascular disease, unspecified: Secondary | ICD-10-CM | POA: Diagnosis not present

## 2016-01-25 DIAGNOSIS — R1310 Dysphagia, unspecified: Secondary | ICD-10-CM | POA: Diagnosis not present

## 2016-01-25 DIAGNOSIS — J441 Chronic obstructive pulmonary disease with (acute) exacerbation: Secondary | ICD-10-CM | POA: Diagnosis not present

## 2016-01-25 DIAGNOSIS — J9612 Chronic respiratory failure with hypercapnia: Secondary | ICD-10-CM | POA: Diagnosis not present

## 2016-01-30 DIAGNOSIS — J9612 Chronic respiratory failure with hypercapnia: Secondary | ICD-10-CM | POA: Diagnosis not present

## 2016-01-30 DIAGNOSIS — R1310 Dysphagia, unspecified: Secondary | ICD-10-CM | POA: Diagnosis not present

## 2016-01-30 DIAGNOSIS — G609 Hereditary and idiopathic neuropathy, unspecified: Secondary | ICD-10-CM | POA: Diagnosis not present

## 2016-01-30 DIAGNOSIS — J45909 Unspecified asthma, uncomplicated: Secondary | ICD-10-CM | POA: Diagnosis not present

## 2016-01-30 DIAGNOSIS — I110 Hypertensive heart disease with heart failure: Secondary | ICD-10-CM | POA: Diagnosis not present

## 2016-01-30 DIAGNOSIS — I739 Peripheral vascular disease, unspecified: Secondary | ICD-10-CM | POA: Diagnosis not present

## 2016-01-30 DIAGNOSIS — J9611 Chronic respiratory failure with hypoxia: Secondary | ICD-10-CM | POA: Diagnosis not present

## 2016-01-30 DIAGNOSIS — I5030 Unspecified diastolic (congestive) heart failure: Secondary | ICD-10-CM | POA: Diagnosis not present

## 2016-01-30 DIAGNOSIS — J441 Chronic obstructive pulmonary disease with (acute) exacerbation: Secondary | ICD-10-CM | POA: Diagnosis not present

## 2016-01-31 ENCOUNTER — Other Ambulatory Visit: Payer: Self-pay | Admitting: *Deleted

## 2016-01-31 NOTE — Patient Outreach (Signed)
[facility]) Care Management  01/31/2016  Cheryl [name] 21-Mar-1951 [id number]   Transition of care call  RNCM placed call to patient reports that she is doing well reports that her breathing is in the green zone, denies shortness of breath or increased cough or sputum production. Reinforced symptoms in yellow zone to notify MD of patient verbalized understanding. Patient reports that she is getting stronger each day, she babysitting her great grandchildren on today with her husband. [name] states her last visit with physical therapy will be on 3/9, as her goals are being met. Patient reports that she continues to weigh daily, but does not always write it down, but she will start today, her weight is 99.5 patient states she has not lost any more weight, and her appetite is getting better as she is eating a little more than she has been without any problems.  [name] denies any new concerns at this time.   Plan RNCM will meet patient in home for a visit on 3/17, prior to transfer back to health coach for disease management patient is agreeable  [facility] CM Care Plan Problem One        Most Recent Value   Care Plan Problem One  Pt does not want to go back into the hospital    Role Documenting the Problem One  Care Management [place] for Problem One  Active   [facility] Long Term Goal (31-90 days)  Pt will not readmit to acute care setting for 31 days   [facility] Long Term Goal Start Date  01/03/16   [facility] Long Term Goal Met Date  01/31/16   [facility] CM Short Term Goal #1 (0-30 days)  Pt will weigh every day and record weight to monitor for weight loss for the next 30 days.   [facility] CM Short Term Goal #1 Start Date  01/03/16   [facility] CM Short Term Goal #1 Met Date  01/11/16   [facility] CM Short Term Goal #2 (0-30 days)  Pt will make f/u appointment with primary care doctor in the next 7 days   [facility] CM Short Term Goal #2 Start Date  01/05/16   [facility] CM Short Term Goal #2 Met Date  01/11/16    [facility] CM Short Term Goal #3 (0-30 days)  Patient will be able to state at least 3 symptoms in yellow zone in the next 30 days   [facility] CM Short Term Goal #3 Start Date  01/24/16   Interventions for Short Tern Goal #3  Reinforced  COPD yellow and green zone with patient from the action plan.  Discussed with patient when to notify physician or go to emergency room for further treatment.Joylene Draft, RN, [place] Management [phone]- Mobile [phone]- [place] Office

## 2016-02-01 DIAGNOSIS — R1310 Dysphagia, unspecified: Secondary | ICD-10-CM | POA: Diagnosis not present

## 2016-02-01 DIAGNOSIS — I739 Peripheral vascular disease, unspecified: Secondary | ICD-10-CM | POA: Diagnosis not present

## 2016-02-01 DIAGNOSIS — I5030 Unspecified diastolic (congestive) heart failure: Secondary | ICD-10-CM | POA: Diagnosis not present

## 2016-02-01 DIAGNOSIS — J441 Chronic obstructive pulmonary disease with (acute) exacerbation: Secondary | ICD-10-CM | POA: Diagnosis not present

## 2016-02-01 DIAGNOSIS — J9611 Chronic respiratory failure with hypoxia: Secondary | ICD-10-CM | POA: Diagnosis not present

## 2016-02-01 DIAGNOSIS — I110 Hypertensive heart disease with heart failure: Secondary | ICD-10-CM | POA: Diagnosis not present

## 2016-02-01 DIAGNOSIS — G609 Hereditary and idiopathic neuropathy, unspecified: Secondary | ICD-10-CM | POA: Diagnosis not present

## 2016-02-01 DIAGNOSIS — J45909 Unspecified asthma, uncomplicated: Secondary | ICD-10-CM | POA: Diagnosis not present

## 2016-02-01 DIAGNOSIS — J9612 Chronic respiratory failure with hypercapnia: Secondary | ICD-10-CM | POA: Diagnosis not present

## 2016-02-09 ENCOUNTER — Encounter: Payer: Self-pay | Admitting: *Deleted

## 2016-02-09 ENCOUNTER — Other Ambulatory Visit: Payer: Self-pay | Admitting: *Deleted

## 2016-02-09 VITALS — BP 110/60 | HR 70 | Resp 20

## 2016-02-09 DIAGNOSIS — J441 Chronic obstructive pulmonary disease with (acute) exacerbation: Secondary | ICD-10-CM | POA: Diagnosis not present

## 2016-02-09 DIAGNOSIS — I5030 Unspecified diastolic (congestive) heart failure: Secondary | ICD-10-CM | POA: Diagnosis not present

## 2016-02-09 DIAGNOSIS — I739 Peripheral vascular disease, unspecified: Secondary | ICD-10-CM | POA: Diagnosis not present

## 2016-02-09 DIAGNOSIS — J690 Pneumonitis due to inhalation of food and vomit: Secondary | ICD-10-CM

## 2016-02-09 DIAGNOSIS — J45909 Unspecified asthma, uncomplicated: Secondary | ICD-10-CM | POA: Diagnosis not present

## 2016-02-09 DIAGNOSIS — M6281 Muscle weakness (generalized): Secondary | ICD-10-CM | POA: Diagnosis not present

## 2016-02-09 DIAGNOSIS — J9611 Chronic respiratory failure with hypoxia: Secondary | ICD-10-CM | POA: Diagnosis not present

## 2016-02-09 DIAGNOSIS — G609 Hereditary and idiopathic neuropathy, unspecified: Secondary | ICD-10-CM | POA: Diagnosis not present

## 2016-02-09 DIAGNOSIS — R1310 Dysphagia, unspecified: Secondary | ICD-10-CM | POA: Diagnosis not present

## 2016-02-09 DIAGNOSIS — J449 Chronic obstructive pulmonary disease, unspecified: Secondary | ICD-10-CM | POA: Diagnosis not present

## 2016-02-09 DIAGNOSIS — I110 Hypertensive heart disease with heart failure: Secondary | ICD-10-CM | POA: Diagnosis not present

## 2016-02-09 DIAGNOSIS — I10 Essential (primary) hypertension: Secondary | ICD-10-CM

## 2016-02-09 DIAGNOSIS — J9612 Chronic respiratory failure with hypercapnia: Secondary | ICD-10-CM | POA: Diagnosis not present

## 2016-02-09 NOTE — Addendum Note (Signed)
Addended by: Egbert GaribaldiGLOVER, KIMBERLY A on: 02/09/2016 04:27 PM   Modules accepted: Orders

## 2016-02-09 NOTE — Patient Outreach (Addendum)
[facility]) Care Management   02/09/2016  Sueanne MargaritaMary George Hanif [birth year] [id number]  Blima DessertMary George Winnifred FriarRobey is an 65 y.o. female  Subjective: " I am having a good day"  Objective:   Review of Systems  Constitutional: Negative.   HENT: Negative.   Eyes: Negative.   Respiratory: Positive for sputum production. Negative for shortness of breath and wheezing.        Occasional cough- with "light colored sputum" that has not increased.  Cardiovascular: Negative.  Negative for chest pain and leg swelling.  Gastrointestinal: Negative.   Genitourinary: Negative.   Musculoskeletal: Positive for back pain. Negative for falls.  Skin: Negative.   Neurological: Negative.   Endo/Heme/Allergies: Negative.   Psychiatric/Behavioral: Positive for memory loss.       Forgetful, has to keep list and notes     Physical Exam  Constitutional: She is oriented to person, place, and time. She appears well-developed and well-nourished.  Cardiovascular: Normal rate and normal heart sounds.   Respiratory: Effort normal and breath sounds normal. No respiratory distress. She has no wheezes.  GI: Soft. Bowel sounds are normal.  Neurological: She is alert and oriented to person, place, and time.  Skin: Skin is warm and dry.  Psychiatric: She has a normal mood and affect. Her behavior is normal. Judgment and thought content normal.    Current Medications:   Current Outpatient Prescriptions  Medication Sig Dispense Refill  . acetaminophen (TYLENOL) 500 MG tablet Take 1,000 mg by mouth every 6 (six) hours as needed for mild pain or headache.     . albuterol (PROVENTIL HFA;VENTOLIN HFA) 108 (90 BASE) MCG/ACT inhaler Inhale 2 puffs into the lungs every 4 (four) hours as needed for wheezing or shortness of breath. 18 g 3  . albuterol (PROVENTIL) (2.5 MG/3ML) 0.083% nebulizer solution Take 3 mLs (2.5 mg total) by nebulization every 4 (four) hours as needed for wheezing. 75 mL 12  . ALPRAZolam (XANAX)  0.5 MG tablet Take 0.5 mg by mouth every 4 (four) hours as needed for anxiety.     . benzonatate (TESSALON) 200 MG capsule Take 200 mg by mouth 3 (three) times daily as needed for cough.    . clopidogrel (PLAVIX) 75 MG tablet Take 75 mg by mouth daily.    . diphenoxylate-atropine (LOMOTIL) 2.5-0.025 MG per tablet Take 2 tablets by mouth 4 (four) times daily as needed for diarrhea or loose stools.     . donepezil (ARICEPT) 10 MG tablet Take 10 mg by mouth at bedtime.    Marland Kitchen. estropipate (OGEN) 0.75 MG tablet Take 1 tablet (0.75 mg total) by mouth daily. 30 tablet 6  . fentaNYL (DURAGESIC - DOSED MCG/HR) 50 MCG/HR Place 1 patch (50 mcg total) onto the skin every 3 (three) days. 10 patch 0  . Fluticasone-Salmeterol (ADVAIR) 250-50 MCG/DOSE AEPB Inhale 1 puff into the lungs 2 (two) times daily.    . furosemide (LASIX) 40 MG tablet Take 40 mg by mouth daily.    Marland Kitchen. guaiFENesin-dextromethorphan (ROBITUSSIN DM) 100-10 MG/5ML syrup Take 5 mLs by mouth every 4 (four) hours as needed for cough. 118 mL 0  . pantoprazole (PROTONIX) 40 MG tablet Take 40 mg by mouth daily.     . pregabalin (LYRICA) 150 MG capsule Take 1 capsule (150 mg total) by mouth 2 (two) times daily. 60 capsule 2  . simvastatin (ZOCOR) 10 MG tablet Take 10 mg by mouth at bedtime. Reported on 12/04/2015    . SUMAtriptan (IMITREX) 25 MG  tablet Take 25 mg by mouth as needed for migraine. May repeat in 2 hours if headache persists or recurs.    Marland Kitchen tiotropium (SPIRIVA) 18 MCG inhalation capsule Place 1 capsule (18 mcg total) into inhaler and inhale daily. 30 capsule 12  . traMADol (ULTRAM) 50 MG tablet Take 100 mg by mouth 4 (four) times daily.    Marland Kitchen dronabinol (MARINOL) 2.5 MG capsule Take 1 capsule (2.5 mg total) by mouth 2 (two) times daily before lunch and supper. (Patient not taking: Reported on 02/09/2016) 60 capsule 1   No current facility-administered medications for this visit.    Functional Status:   In your present state of health, do you  have any difficulty performing the following activities: 01/05/2016 11/19/2015  Hearing? N N  Vision? N N  Difficulty concentrating or making decisions? N N  Walking or climbing stairs? Y Y  Dressing or bathing? Y Y  Doing errands, shopping? [name]  Preparing Food and eating ? Y -  Using the Toilet? Y -  In the past six months, have you accidently leaked urine? Y -  Do you have problems with loss of bowel control? Y -  Managing your Medications? Y -  Managing your Finances? Y -  Housekeeping or managing your Housekeeping? Y -    Fall/Depression Screening:    PHQ 2/9 Scores 01/05/2016 12/04/2015 11/03/2015 09/13/2015 09/01/2015 07/13/2015 06/06/2015  PHQ - 2 Score 0 0 0 0 0 0 0    Assessment:  Discharge home visit. Patient has completed her home health therapy. Patient continues to be active at home, cleaning her home is her daily activity that she tolerates, taking rest breaks .   COPD Patient reports breathing good - in the green zone on today, patient reports occasional cough with light colored sputum. Patient states she uses her oxygen only as needed, and that she has not required oxygen in over a month.Patient is able to state symptoms in yellow zone to take action of wearing oxygen, pursed lip breathing and notifying MD for same day office visit.  Nutrition  Patient reports appetite is improved and she is tolerating eating better. Patient today's weight is 99.5, patient states her weight averages between 99.5 and 101.  Patient is has my contact and [facility] contact numbers if needs arise.   Plan:  Patient will notify MD for worsening COPD symptoms Patient understands to continue to weight daily, maintain a healthy balanced diet, use her walker at all times, take medication at prescribed. Transition patient  to health coach for continued disease management related to COPD patient agreeable.Diamantina Monks MD update.  Egbert Garibaldi, RN, [facility] Care Management [phone] Mobile [phone]  Toll Free Main Office

## 2016-02-14 DIAGNOSIS — G609 Hereditary and idiopathic neuropathy, unspecified: Secondary | ICD-10-CM | POA: Diagnosis not present

## 2016-02-14 DIAGNOSIS — J449 Chronic obstructive pulmonary disease, unspecified: Secondary | ICD-10-CM | POA: Diagnosis not present

## 2016-02-14 DIAGNOSIS — N390 Urinary tract infection, site not specified: Secondary | ICD-10-CM | POA: Diagnosis not present

## 2016-02-14 DIAGNOSIS — R110 Nausea: Secondary | ICD-10-CM | POA: Diagnosis not present

## 2016-02-28 ENCOUNTER — Ambulatory Visit: Payer: Commercial Managed Care - HMO | Attending: Pain Medicine | Admitting: Pain Medicine

## 2016-02-28 ENCOUNTER — Other Ambulatory Visit: Payer: Self-pay | Admitting: Pain Medicine

## 2016-02-28 ENCOUNTER — Encounter: Payer: Self-pay | Admitting: Pain Medicine

## 2016-02-28 VITALS — BP 144/79 | HR 76 | Temp 97.9°F | Resp 16 | Ht 61.0 in | Wt 99.0 lb

## 2016-02-28 DIAGNOSIS — I739 Peripheral vascular disease, unspecified: Secondary | ICD-10-CM | POA: Diagnosis not present

## 2016-02-28 DIAGNOSIS — F410 Panic disorder [episodic paroxysmal anxiety] without agoraphobia: Secondary | ICD-10-CM | POA: Insufficient documentation

## 2016-02-28 DIAGNOSIS — E559 Vitamin D deficiency, unspecified: Secondary | ICD-10-CM | POA: Insufficient documentation

## 2016-02-28 DIAGNOSIS — M797 Fibromyalgia: Secondary | ICD-10-CM

## 2016-02-28 DIAGNOSIS — R1310 Dysphagia, unspecified: Secondary | ICD-10-CM | POA: Diagnosis not present

## 2016-02-28 DIAGNOSIS — F419 Anxiety disorder, unspecified: Secondary | ICD-10-CM | POA: Diagnosis not present

## 2016-02-28 DIAGNOSIS — E785 Hyperlipidemia, unspecified: Secondary | ICD-10-CM | POA: Insufficient documentation

## 2016-02-28 DIAGNOSIS — Z79899 Other long term (current) drug therapy: Secondary | ICD-10-CM

## 2016-02-28 DIAGNOSIS — G43909 Migraine, unspecified, not intractable, without status migrainosus: Secondary | ICD-10-CM | POA: Diagnosis not present

## 2016-02-28 DIAGNOSIS — Z5181 Encounter for therapeutic drug level monitoring: Secondary | ICD-10-CM | POA: Diagnosis not present

## 2016-02-28 DIAGNOSIS — E7800 Pure hypercholesterolemia, unspecified: Secondary | ICD-10-CM | POA: Diagnosis not present

## 2016-02-28 DIAGNOSIS — F17210 Nicotine dependence, cigarettes, uncomplicated: Secondary | ICD-10-CM | POA: Insufficient documentation

## 2016-02-28 DIAGNOSIS — Z90710 Acquired absence of both cervix and uterus: Secondary | ICD-10-CM | POA: Insufficient documentation

## 2016-02-28 DIAGNOSIS — I2510 Atherosclerotic heart disease of native coronary artery without angina pectoris: Secondary | ICD-10-CM | POA: Insufficient documentation

## 2016-02-28 DIAGNOSIS — G8929 Other chronic pain: Secondary | ICD-10-CM | POA: Diagnosis not present

## 2016-02-28 DIAGNOSIS — M810 Age-related osteoporosis without current pathological fracture: Secondary | ICD-10-CM | POA: Diagnosis not present

## 2016-02-28 DIAGNOSIS — K219 Gastro-esophageal reflux disease without esophagitis: Secondary | ICD-10-CM | POA: Diagnosis not present

## 2016-02-28 DIAGNOSIS — J449 Chronic obstructive pulmonary disease, unspecified: Secondary | ICD-10-CM | POA: Insufficient documentation

## 2016-02-28 DIAGNOSIS — R32 Unspecified urinary incontinence: Secondary | ICD-10-CM | POA: Insufficient documentation

## 2016-02-28 DIAGNOSIS — G4700 Insomnia, unspecified: Secondary | ICD-10-CM | POA: Insufficient documentation

## 2016-02-28 DIAGNOSIS — Z7902 Long term (current) use of antithrombotics/antiplatelets: Secondary | ICD-10-CM | POA: Insufficient documentation

## 2016-02-28 DIAGNOSIS — D649 Anemia, unspecified: Secondary | ICD-10-CM | POA: Insufficient documentation

## 2016-02-28 DIAGNOSIS — M546 Pain in thoracic spine: Secondary | ICD-10-CM | POA: Diagnosis not present

## 2016-02-28 DIAGNOSIS — Z79891 Long term (current) use of opiate analgesic: Secondary | ICD-10-CM | POA: Diagnosis not present

## 2016-02-28 DIAGNOSIS — I10 Essential (primary) hypertension: Secondary | ICD-10-CM | POA: Diagnosis not present

## 2016-02-28 DIAGNOSIS — I509 Heart failure, unspecified: Secondary | ICD-10-CM | POA: Insufficient documentation

## 2016-02-28 DIAGNOSIS — I5031 Acute diastolic (congestive) heart failure: Secondary | ICD-10-CM | POA: Insufficient documentation

## 2016-02-28 DIAGNOSIS — M549 Dorsalgia, unspecified: Secondary | ICD-10-CM | POA: Diagnosis present

## 2016-02-28 DIAGNOSIS — F1190 Opioid use, unspecified, uncomplicated: Secondary | ICD-10-CM

## 2016-02-28 MED ORDER — FENTANYL 50 MCG/HR TD PT72: 50.0000 ug | MEDICATED_PATCH | TRANSDERMAL | Status: DC

## 2016-02-28 MED ORDER — HYDROCODONE-ACETAMINOPHEN 5-325 MG PO TABS: 1.0000 | ORAL_TABLET | Freq: Four times a day (QID) | ORAL | Status: DC | PRN

## 2016-02-28 MED ORDER — PREGABALIN 150 MG PO CAPS: 150.0000 mg | ORAL_CAPSULE | Freq: Two times a day (BID) | ORAL | Status: DC

## 2016-02-28 NOTE — Patient Instructions (Addendum)
Smoking Cessation, Tips for Success If you are ready to quit smoking, congratulations! You have chosen to help yourself be healthier. Cigarettes bring nicotine, tar, carbon monoxide, and other irritants into your body. Your lungs, heart, and blood vessels will be able to work better without these poisons. There are many different ways to quit smoking. Nicotine gum, nicotine patches, a nicotine inhaler, or nicotine nasal spray can help with physical craving. Hypnosis, support groups, and medicines help break the habit of smoking. WHAT THINGS CAN I DO TO MAKE QUITTING EASIER?  Here are some tips to help you quit for good:  Pick a date when you will quit smoking completely. Tell all of your friends and family about your plan to quit on that date.  Do not try to slowly cut down on the number of cigarettes you are smoking. Pick a quit date and quit smoking completely starting on that day.  Throw away all cigarettes.   Clean and remove all ashtrays from your home, work, and car.  On a card, write down your reasons for quitting. Carry the card with you and read it when you get the urge to smoke.  Cleanse your body of nicotine. Drink enough water and fluids to keep your urine clear or pale yellow. Do this after quitting to flush the nicotine from your body.  Learn to predict your moods. Do not let a bad situation be your excuse to have a cigarette. Some situations in your life might tempt you into wanting a cigarette.  Never have "just one" cigarette. It leads to wanting another and another. Remind yourself of your decision to quit.  Change habits associated with smoking. If you smoked while driving or when feeling stressed, try other activities to replace smoking. Stand up when drinking your coffee. Brush your teeth after eating. Sit in a different chair when you read the paper. Avoid alcohol while trying to quit, and try to drink fewer caffeinated beverages. Alcohol and caffeine may urge you to  smoke.  Avoid foods and drinks that can trigger a desire to smoke, such as sugary or spicy foods and alcohol.  Ask people who smoke not to smoke around you.  Have something planned to do right after eating or having a cup of coffee. For example, plan to take a walk or exercise.  Try a relaxation exercise to calm you down and decrease your stress. Remember, you may be tense and nervous for the first 2 weeks after you quit, but this will pass.  Find new activities to keep your hands busy. Play with a pen, coin, or rubber band. Doodle or draw things on paper.  Brush your teeth right after eating. This will help cut down on the craving for the taste of tobacco after meals. You can also try mouthwash.   Use oral substitutes in place of cigarettes. Try using lemon drops, carrots, cinnamon sticks, or chewing gum. Keep them handy so they are available when you have the urge to smoke.  When you have the urge to smoke, try deep breathing.  Designate your home as a nonsmoking area.  If you are a heavy smoker, ask your health care provider about a prescription for nicotine chewing gum. It can ease your withdrawal from nicotine.  Reward yourself. Set aside the cigarette money you save and buy yourself something nice.  Look for support from others. Join a support group or smoking cessation program. Ask someone at home or at work to help you with your plan   to quit smoking.  Always ask yourself, "Do I need this cigarette or is this just a reflex?" Tell yourself, "Today, I choose not to smoke," or "I do not want to smoke." You are reminding yourself of your decision to quit.  Do not replace cigarette smoking with electronic cigarettes (commonly called e-cigarettes). The safety of e-cigarettes is unknown, and some may contain harmful chemicals.  If you relapse, do not give up! Plan ahead and think about what you will do the next time you get the urge to smoke. HOW WILL I FEEL WHEN I QUIT SMOKING? You  may have symptoms of withdrawal because your body is used to nicotine (the addictive substance in cigarettes). You may crave cigarettes, be irritable, feel very hungry, cough often, get headaches, or have difficulty concentrating. The withdrawal symptoms are only temporary. They are strongest when you first quit but will go away within 10-14 days. When withdrawal symptoms occur, stay in control. Think about your reasons for quitting. Remind yourself that these are signs that your body is healing and getting used to being without cigarettes. Remember that withdrawal symptoms are easier to treat than the major diseases that smoking can cause.  Even after the withdrawal is over, expect periodic urges to smoke. However, these cravings are generally short lived and will go away whether you smoke or not. Do not smoke! WHAT RESOURCES ARE AVAILABLE TO HELP ME QUIT SMOKING? Your health care provider can direct you to community resources or hospitals for support, which may include:  Group support.  Education.  Hypnosis.  Therapy.   This information is not intended to replace advice given to you by your health care provider. Make sure you discuss any questions you have with your health care provider.   Document Released: 08/09/2004 Document Revised: 12/02/2014 Document Reviewed: 04/29/2013 Elsevier Interactive Patient Education 2016 Elsevier Inc. Oxycodone extended-release capsules What is this medicine? OXYCODONE (ox i KOE done) is a pain reliever. It is used to treat constant pain that lasts for more than a few days. This medicine may be used for other purposes; ask your health care provider or pharmacist if you have questions. What should I tell my health care provider before I take this medicine? They need to know if you have any of these conditions: -Addison's disease -brain tumor -gallbladder disease -head injury -heart disease -history of a drug or alcohol abuse problem -if you often drink  alcohol -kidney disease -liver disease -lung or breathing disease, like asthma -mental illness -pancreatic disease -seizures -stomach or intestine problems -thyroid disease -an unusual or allergic reaction to oxycodone, codeine, hydrocodone, morphine, other medicines, foods, dyes, or preservatives -pregnant or trying to get pregnant -breast-feeding How should I use this medicine? Take this medicine by mouth with a full glass of water. Follow the directions on the prescription label. Take this medicine with food. You should always take it with the same amount of food each time. Take your medicine at regular intervals. Do not take it more often than directed. Do not stop taking except on your doctor's advice. A special MedGuide will be given to you by the pharmacist with each prescription and refill. Be sure to read this information carefully each time. Talk to your pediatrician regarding the use of this medicine in children. Special care may be needed. Overdosage: If you think you have taken too much of this medicine contact a poison control center or emergency room at once. NOTE: This medicine is only for you. Do not share this  medicine with others. What if I miss a dose? If you miss a dose, take it as soon as you can. If it is almost time for your next dose, take only that dose. Do not take double or extra doses. What may interact with this medicine? This medicine may interact with the following medications: -antihistamines for allergy, cough and cold -antiviral medicines for HIV or AIDS -atropine -certain antibiotics like clarithromycin, erythromycin, linezolid -certain medicines for anxiety or sleep -certain medicines for bladder problems like oxybutynin, tolterodine -certain medicines for depression, like amitriptyline, fluoxetine, sertraline -certain medicines for fungal infections like ketoconazole and itraconazole -certain medicines for migraine headache like almotriptan,  eletriptan, frovatriptan, naratriptan, rizatriptan, sumatriptan, zolmitriptan -certain medicines for Parkinson's disease like benztropine, trihexyphenidyl -certain medicines for seizures like carbamazepine, phenobarbital, phenytoin, primidone -certain medicines for stomach problems like dicyclomine, hyoscyamine -certain medicines for travel sickness like scopolamine -diuretics -general anesthetics like halothane, isoflurane, methoxyflurane, propofol -ipratropium -local anesthetics like lidocaine, pramoxine, tetracaine -MAOIs like Carbex, Eldepryl, Marplan, Nardil, and Parnate -medicines that relax muscles for surgery -narcotic medicines for pain -phenothiazines like chlorpromazine, mesoridazine, prochlorperazine, thioridazine -rifampin This list may not describe all possible interactions. Give your health care provider a list of all the medicines, herbs, non-prescription drugs, or dietary supplements you use. Also tell them if you smoke, drink alcohol, or use illegal drugs. Some items may interact with your medicine. What should I watch for while using this medicine? Tell your doctor or health care professional if your pain does not go away, if it gets worse, or if you have new or a different type of pain. You may develop tolerance to the medicine. Tolerance means that you will need a higher dose of the medication for pain relief. Tolerance is normal and is expected if you take this medicine for a long time. Do not suddenly stop taking your medicine because you may develop a severe reaction. Your body becomes used to the medicine. This does NOT mean you are addicted. Addiction is a behavior related to getting and using a drug for a non-medical reason. If you have pain, you have a medical reason to take pain medicine. Your doctor will tell you how much medicine to take. If your doctor wants you to stop the medicine, the dose will be slowly lowered over time to avoid any side effects. You may get  drowsy or dizzy. Do not drive, use machinery, or do anything that needs mental alertness until you know how the medicine affects you. Do not stand or sit up quickly, especially if you are an older patient. This reduces the risk of dizzy or fainting spells. Alcohol may interfere with the effect of this medicine. Avoid alcoholic drinks. There are different types of narcotic medicines (opiates) for pain. If you take more than one type at the same time, you may have more side effects. Give your health care provider a list of all medicines you use. Your doctor will tell you how much medicine to take. Do not take more medicine than directed. Call emergency for help if you have problems breathing. This medicine will cause constipation. Try to have a bowel movement at least every 2 to 3 days. If you do not have a bowel movement for 3 days, call your doctor or health care professional. Your mouth may get dry. Chewing sugarless gum or sucking on hard candy, and drinking plenty of water may help. Contact your doctor if the problem does not go away or is severe. What side effects may  I notice from receiving this medicine? Side effects that you should report to your doctor or health care professional as soon as possible: -allergic reactions like skin rash, itching or hives, swelling of the face, lips, or tongue -breathing problems -confusion -feeling faint or lightheaded, falls -stomach pain -trouble passing urine or change in the amount of urine -trouble swallowing -unusually weak or tired Side effects that usually do not require medical attention (report these to your doctor or health care professional if they continue or are bothersome): -constipation -dizziness -dry mouth -itching -nausea, vomiting -tiredness -upset stomach This list may not describe all possible side effects. Call your doctor for medical advice about side effects. You may report side effects to FDA at 1-800-FDA-1088. Where should I  keep my medicine? Keep out of the reach of children. This medicine can be abused. Keep your medicine in a safe place to protect it from theft. Do not share this medicine with anyone. Selling or giving away this medicine is dangerous and against the law. Follow the directions in the MedGuide. Store at room temperature between 15 and 30 degrees C (59 and 86 degrees F). Protect from light. Keep container tightly closed. This medicine may cause accidental overdose and death if it is taken by other adults, children, or pets. Flush any unused medicine down the toilet to reduce the chance of harm. Do not use the medicine after the expiration date. NOTE: This sheet is a summary. It may not cover all possible information. If you have questions about this medicine, talk to your doctor, pharmacist, or health care provider.    2016, Elsevier/Gold Standard. (2015-03-24 16:05:07)

## 2016-02-28 NOTE — Telephone Encounter (Signed)

## 2016-02-28 NOTE — Progress Notes (Signed)
Patient's Name: Cheryl [name] Patient type: Established  MRN: [id number] Service setting: Ambulatory outpatient  DOB: 1951/04/06   DOS: 02/28/2016    Primary Reason(s) for Visit: Encounter for prescription drug management (Level of risk: moderate) CC: Back Pain   HPI  Cheryl [name] is a 65 y.o. year old, female patient, who returns today as an established patient. She has Essential hypertension; GERD (gastroesophageal reflux disease); Hyperlipemia; Anxiety; Airway hyperreactivity; Back pain, thoracic; Coronary artery disease; Excessive falling; Alteration in bowel elimination: incontinence; Insomnia; Decreased testosterone level; Leg weakness; Menopausal symptom; Migraine; Neuropathy (HCC); Fecal occult blood test positive; OP (osteoporosis); Panic disorder; Compulsive tobacco user syndrome; Urinary incontinence; Vitamin D deficiency; Weight loss; COPD (chronic obstructive pulmonary disease) (HCC); Dementia; Lumbar radicular pain; Chronic low back pain; Encounter for therapeutic drug level monitoring; Uncomplicated opioid dependence (HCC); Chronic pain syndrome; Hypercholesteremia; Peripheral nerve disease (HCC); Peripheral vascular disease (HCC); Platelet inhibition due to Plavix; Abnormal mammogram of right breast; Dilated intrahepatic bile duct; Dysphagia; Acute diastolic CHF (congestive heart failure) (HCC); Long term current use of opiate analgesic; Chronic pain; Fibromyalgia; Osteoarthrosis; Long term current use of anticoagulant therapy (Plavix); Long term prescription opiate use; Opiate use (160 MME/Day); and Anemia on her problem list.. Her primarily concern today is the Back Pain   Pain Assessment: Self-Reported Pain Score: 3  Reported level is compatible with observation Pain Type: Chronic pain Pain Location: Back Pain Orientation: Lower, Left Pain Descriptors / Indicators: Aching Pain Frequency: Constant  The patient comes in today clinics today for pharmacological management of her  chronic pain. She seems to be doing well with her medication regiment except that she was able to tell a difference when she was taken out of the Celebrex. Unfortunately, she is now taking Plavix and her kidney function was decreased and therefore we will stay away from the nonsteroidal anti-inflammatory drugs. She is already taking maximal doses of the tramadol therefore I cannot increase that one. Today I have decided to discontinue the tramadol and do a hydrocodone trial to see if this works better for her.  Date of Last Visit: 12/04/15 Service Provided on Last Visit: Med Refill  Controlled Substance Pharmacotherapy Assessment  Analgesic: Duragesic 50 g per hour every 72 hours + tramadol 100 mg every 6 hours (400 mg/day) Pill Count: Fentanyl patch count # 6/10 Filled 02-18-16. Tramadol pill count # 78/240 Filled 02-07-16 MME/day: 160 mg/day.  Pharmacokinetics: Onset of action (Liberation/Absorption): Within expected pharmacological parameters Time to Peak effect (Distribution): Timing and results are as within normal expected parameters Duration of action (Metabolism/Excretion): Within normal limits for medication Pharmacodynamics: Analgesic Effect: More than 50% Activity Facilitation: Medication(s) allow patient to sit, stand, walk, and do the basic ADLs Perceived Effectiveness: Described as relatively effective, allowing for increase in activities of daily living (ADL) Side-effects or Adverse reactions: None reported Monitoring: [place] PMP: Online review of the past 75-month period conducted. Compliant with practice rules and regulations UDS Results/interpretation: The patient's last UDS was done on 12/04/2015 and it came back within normal limits with no unexpected results. Medication Assessment Form: Reviewed. Patient indicates being compliant with therapy Treatment compliance: Compliant Risk Assessment: Aberrant Behavior: None observed today Substance Use Disorder (SUD) Risk Level:  Low Risk of opioid abuse or dependence: 0.7-3.0% with doses ? 36 MME/day and 6.1-26% with doses ? 120 MME/day. Opioid Risk Tool (ORT) Score: Total Score: 0 Low Risk for SUD (Score <3) Depression Scale Score: PHQ-2: PHQ-2 Total Score: 0 No depression (0) PHQ-9: PHQ-9 Total Score: 0 No  depression (0-4)  Pharmacologic Plan: No change in therapy, at this time  Laboratory Chemistry  Inflammation Markers No results found for: ESRSEDRATE, CRP  Renal Function Lab Results  Component Value Date   BUN 11 01/17/2016   CREATININE 0.80 01/17/2016   GFRAA 89 01/17/2016   GFRNONAA 77 01/17/2016    Hepatic Function Lab Results  Component Value Date   AST 16 01/17/2016   ALT 7 01/17/2016   ALBUMIN 4.1 01/17/2016    Electrolytes Lab Results  Component Value Date   NA 140 01/17/2016   K 4.9 01/17/2016   CL 96 01/17/2016   CALCIUM 9.3 01/17/2016   MG 1.9 05/07/2013    Pain Modulating Vitamins Lab Results  Component Value Date   VD25OH 25.6* 01/17/2016    Coagulation Parameters Lab Results  Component Value Date   INR 1.08 10/17/2015   LABPROT 14.2 10/17/2015    Note: I personally reviewed the above data. Results shared with patient.  Meds  The patient has a current medication list which includes the following prescription(s): acetaminophen, albuterol, albuterol, alprazolam, benzonatate, clopidogrel, diphenoxylate-atropine, donepezil, estropipate, fentanyl, fluticasone-salmeterol, furosemide, guaifenesin-dextromethorphan, pantoprazole, pregabalin, simvastatin, sumatriptan, tiotropium, and hydrocodone-acetaminophen.  Current Outpatient Prescriptions on File Prior to Visit  Medication Sig  . acetaminophen (TYLENOL) 500 MG tablet Take 1,000 mg by mouth every 6 (six) hours as needed for mild pain or headache.   . albuterol (PROVENTIL HFA;VENTOLIN HFA) 108 (90 BASE) MCG/ACT inhaler Inhale 2 puffs into the lungs every 4 (four) hours as needed for wheezing or shortness of breath.  Marland Kitchen  albuterol (PROVENTIL) (2.5 MG/3ML) 0.083% nebulizer solution Take 3 mLs (2.5 mg total) by nebulization every 4 (four) hours as needed for wheezing.  Marland Kitchen ALPRAZolam (XANAX) 0.5 MG tablet Take 0.5 mg by mouth every 4 (four) hours as needed for anxiety.   . benzonatate (TESSALON) 200 MG capsule Take 200 mg by mouth 3 (three) times daily as needed for cough.  . clopidogrel (PLAVIX) 75 MG tablet Take 75 mg by mouth daily.  . diphenoxylate-atropine (LOMOTIL) 2.5-0.025 MG per tablet Take 2 tablets by mouth 4 (four) times daily as needed for diarrhea or loose stools.   . donepezil (ARICEPT) 10 MG tablet Take 10 mg by mouth at bedtime.  Marland Kitchen estropipate (OGEN) 0.75 MG tablet Take 1 tablet (0.75 mg total) by mouth daily.  . Fluticasone-Salmeterol (ADVAIR) 250-50 MCG/DOSE AEPB Inhale 1 puff into the lungs 2 (two) times daily.  . furosemide (LASIX) 40 MG tablet Take 40 mg by mouth daily.  Marland Kitchen guaiFENesin-dextromethorphan (ROBITUSSIN DM) 100-10 MG/5ML syrup Take 5 mLs by mouth every 4 (four) hours as needed for cough.  . pantoprazole (PROTONIX) 40 MG tablet Take 40 mg by mouth daily.   . simvastatin (ZOCOR) 10 MG tablet Take 10 mg by mouth at bedtime. Reported on 12/04/2015  . SUMAtriptan (IMITREX) 25 MG tablet Take 25 mg by mouth as needed for migraine. May repeat in 2 hours if headache persists or recurs.  Marland Kitchen tiotropium (SPIRIVA) 18 MCG inhalation capsule Place 1 capsule (18 mcg total) into inhaler and inhale daily.   No current facility-administered medications on file prior to visit.    ROS  Constitutional: Afebrile, no chills, well hydrated and well nourished Gastrointestinal: No upper or lower GI bleeding, no nausea, no vomiting and no acute GI distress Musculoskeletal: No acute joint swelling or redness, no acute loss of range of motion and no acute onset weakness Neurological: Denies any acute onset apraxia, no episodes of paralysis, no acute  loss of coordination, no acute loss of consciousness and no acute  onset aphasia, dysarthria, agnosia, or amnesia  Allergies  Cheryl [name] is allergic to percocet; aspirin; codeine; morphine; pregabalin; propoxyphene; and sulfa antibiotics.  PFSH  Medical:  Cheryl [name]  has a past medical history of COPD (chronic obstructive pulmonary disease) (HCC); Hypertension; Arthritis; Hyperlipidemia; GERD (gastroesophageal reflux disease); Neuropathy (HCC) (2010); Oxygen deficiency; Coronary artery disease; Headache; Anxiety; Peripheral vascular disease (HCC); Vitamin D deficiency; Migraines; Chronic pain; DVT (deep venous thrombosis) (HCC); Pneumonia; Osteoporosis; Allergy; Asthma; Aspiration pneumonitis (HCC) (11/24/2015); and Pneumonia (11/19/2015). Family: family history includes Arthritis in her mother; Cancer in her brother, brother, and mother; Diabetes in her brother, mother, and sister; Heart disease in her father and mother. Surgical:  has past surgical history that includes Appendectomy; Spine surgery; Foot surgery (Bilateral); cardiac catherization (10/31/2009); abdomnal aortic stent (05/30/2008); Abdominal hysterectomy (1975); Cholecystectomy (1972); Cervical fusion (C5 - 6/C6-7); Appendectomy; Colonoscopy with propofol (N/A, 07/27/2015); and Esophagogastroduodenoscopy (egd) with propofol (N/A, 07/27/2015). Tobacco:  reports that she has been smoking Cigarettes.  She has a 50 pack-year smoking history. She does not have any smokeless tobacco history on file. Alcohol:  reports that she does not drink alcohol. Drug:  reports that she does not use illicit drugs.  Physical Examination  Constitutional Vitals:  Today's Vitals   02/28/16 0944 02/28/16 0946  BP: 144/79   Pulse: 76   Temp: 97.9 F (36.6 C)   Resp: 16   Height:  (1.549 m)   Weight: 99 lb (44.906 kg)   SpO2: 97%   PainSc: 3  3   PainLoc: Back    Calculated BMI: Body mass index is 18.72 kg/(m^2).    General appearance: alert, cooperative, appears stated age, cachectic and mild distress Eyes:  PERLA Respiratory: No evidence respiratory distress, no audible rales or ronchi and no use of accessory muscles of respiration   Cervical Spine Exam  Inspection: Normal anatomy, no anomalies observed Cervical Lordosis: Normal Alignment: Symetrical Functional ROM: Within functional limits (WFL) AROM: WFL Sensory: No sensory abnormalities reported  Upper Extremity Exam   Right  Left  Inspection: No gross anomalies detected Functional ROM: Within functional limits [name])  Inspection: No gross anomalies detected Functional ROM: Within functional limits (WFL)  AROM: Adequate  AROM: Adequate  Sensory: Normal  Sensory: Normal  Motor: Unremarkable       Motor: Unremarkable        Thoracic Spine  Inspection: Severe thoracic kyphosis. Alignment: Symetrical AROM: Decreased  Lumbar Spne  Inspection: No gross anomalies detected Lumbar Lordosis: Normal Sacral Kyphosis: Normal Alignment: Symetrical Palpation: WNL AROM: Decreased Provocative Tests:  Lumbar Hyperextension and rotation test:  deferred Patrick's Maneuver: deferred  Gait Assessment  Gait: WNL  Lower Extremities   Right  Left  Inspection: No gross anomalies detected Functional ROM: Within functional limits [facility])  Inspection: No gross anomalies detected Functional ROM: Within functional limits (WFL)  AROM: Adequate  AROM: Adequate  Sensory:  Normal  Sensory:  Normal  Motor: Unremarkable       Motor: Unremarkable        Assessment & Plan  Primary Diagnosis & Pertinent Problem List: The primary encounter diagnosis was Chronic pain. Diagnoses of Encounter for therapeutic drug level monitoring, Long term current use of opiate analgesic, Long term prescription opiate use, Opiate use, Vitamin D deficiency, Anemia, unspecified anemia type, and Fibromyalgia were also pertinent to this visit.  Visit Diagnosis: 1. Chronic pain   2. Encounter for therapeutic drug level  monitoring   3. Long term current use of opiate analgesic    4. Long term prescription opiate use   5. Opiate use   6. Vitamin D deficiency   7. Anemia, unspecified anemia type   8. Fibromyalgia     Problem-specific Plan(s): No problem-specific assessment & plan notes found for this encounter.   Plan of Care  Pharmacotherapy (Medications Ordered): Meds ordered this encounter  Medications  . fentaNYL (DURAGESIC - DOSED MCG/HR) 50 MCG/HR    Sig: Place 1 patch (50 mcg total) onto the skin every 3 (three) days.    Dispense:  10 patch    Refill:  0    Do not place this medication, or any other prescription from our practice, on "Automatic Refill". Patient may have prescription filled one day early if pharmacy is closed on scheduled refill date. Do not fill until: 03/16/16 To last until: 04/15/16  . HYDROcodone-acetaminophen (NORCO/VICODIN) 5-325 MG tablet    Sig: Take 1 tablet by mouth every 6 (six) hours as needed for moderate pain.    Dispense:  120 tablet    Refill:  0    Do not place this medication, or any other prescription from our practice, on "Automatic Refill". Patient may have prescription filled one day early if pharmacy is closed on scheduled refill date. Do not fill until: 02/27/16 To last until: 03/28/16  . pregabalin (LYRICA) 150 MG capsule    Sig: Take 1 capsule (150 mg total) by mouth 2 (two) times daily.    Dispense:  60 capsule    Refill:  2    Do not place this medication, or any other prescription from our practice, on "Automatic Refill". Patient may have prescription filled one day early if pharmacy is closed on scheduled refill date.    Lab-work & Procedure Ordered: Orders Placed This Encounter  Procedures  . ToxASSURE Select 13 (MW), Urine  . Comprehensive metabolic panel  . C-reactive protein  . Magnesium  . Sedimentation rate  . Vitamin B12    Imaging Ordered: None  Interventional Therapies: Scheduled:  None at this time.    Considering:  None at this time.    PRN Procedures:  None at this time.     Referral(s) or Consult(s): None at this time.  New Prescriptions   HYDROCODONE-ACETAMINOPHEN (NORCO/VICODIN) 5-325 MG TABLET    Take 1 tablet by mouth every 6 (six) hours as needed for moderate pain.    Medications administered during this visit: Cheryl [name] had no medications administered during this visit.  Future Appointments Date Time Provider Department Center  03/11/2016 11:45 AM Luella CookFrances H Pleasant, RN [facility] None  03/14/2016 2:00 PM Delano MetzFrancisco Kentley Blyden, MD [facility] None  04/09/2016 1:45 PM Malva Limesonald E Fisher, MD [facility] None    Primary Care Physician: Mila Merryonald Fisher, MD [facility] Note by: Sydnee LevansFrancisco A. Laban EmperorNaveira, M.D, DABA, DABAPM, DABPM, DABIPP, FIPP  Pain Score Disclaimer: We use the NRS-11 scale. This is a self-reported, subjective measurement of pain severity with only modest accuracy. It is used primarily to identify changes within a particular patient. It must be understood that outpatient pain scales are significantly less accurate that those used for research, where they can be applied under ideal controlled circumstances with minimal exposure to variables. In reality, the score is likely to be a combination of pain intensity and pain affect, where pain affect describes the degree of emotional arousal or changes in action readiness caused by the sensory experience of pain. Factors  such as social and work situation, setting, emotional state, anxiety levels, expectation, and prior pain experience may influence pain perception and show large inter-individual differences that may also be affected by time variables.

## 2016-02-28 NOTE — Progress Notes (Signed)
Safety precautions to be maintained throughout the outpatient stay will include: orient to surroundings, keep bed in low position, maintain call bell within reach at all times, provide assistance with transfer out of bed and ambulation. Fentanyl patch count # 6/10  Filled 02-18-16 Tramadol pill count # 78/240  Filled 02-07-16

## 2016-03-01 ENCOUNTER — Other Ambulatory Visit
Admission: RE | Admit: 2016-03-01 | Discharge: 2016-03-01 | Disposition: A | Discharge status: Home / Self Care | Payer: Commercial Managed Care - HMO | Source: Ambulatory Visit | Attending: Pain Medicine | Admitting: Pain Medicine

## 2016-03-01 DIAGNOSIS — G8929 Other chronic pain: Secondary | ICD-10-CM | POA: Diagnosis not present

## 2016-03-01 DIAGNOSIS — D649 Anemia, unspecified: Secondary | ICD-10-CM

## 2016-03-01 LAB — C-REACTIVE PROTEIN: CRP: 0.6 mg/dL (ref <1.0)

## 2016-03-01 LAB — COMPREHENSIVE METABOLIC PANEL
ALT: 12 U/L — AB (ref 14–54)
AST: 21 U/L (ref 15–41)
Albumin: 3.9 g/dL (ref 3.5–5.0)
Alkaline Phosphatase: 90 U/L (ref 38–126)
Anion gap: 6 (ref 5–15)
BUN: 12 mg/dL (ref 6–20)
CHLORIDE: 107 mmol/L (ref 101–111)
CO2: 27 mmol/L (ref 22–32)
CREATININE: 0.71 mg/dL (ref 0.44–1.00)
Calcium: 9.1 mg/dL (ref 8.9–10.3)
GFR calc Af Amer: >60 mL/min (ref >60)
GFR calc non Af Amer: >60 mL/min (ref >60)
Glucose, Bld: 99 mg/dL (ref 65–99)
Potassium: 3.8 mmol/L (ref 3.5–5.1)
SODIUM: 140 mmol/L (ref 135–145)
Total Bilirubin: 0.4 mg/dL (ref 0.3–1.2)
Total Protein: 7 g/dL (ref 6.5–8.1)

## 2016-03-01 LAB — MAGNESIUM: Magnesium: 2.2 mg/dL (ref 1.7–2.4)

## 2016-03-01 LAB — SEDIMENTATION RATE: Sed Rate: 52 mm/hr — ABNORMAL HIGH (ref 0–30)

## 2016-03-01 LAB — VITAMIN B12: Vitamin B-12: 242 pg/mL (ref 180–914)

## 2016-03-02 NOTE — Progress Notes (Signed)
Quick Note:   While most low ALT level results indicate a normal healthy liver, that may not always be the case. A low-functioning or non-functioning liver, lacking normal levels of ALT activity to begin with, would not release a lot of ALT into the blood when damaged. People infected with the hepatitis C virus initially show high ALT levels in their blood, but these levels fall over time. Because the ALT test measures ALT levels at only one point in time, people with chronic hepatitis C infection may already have experienced the ALT peak well before blood was drawn for the ALT test. Urinary tract infections or malnutrition may also cause low blood ALT levels. ______ 

## 2016-03-02 NOTE — Progress Notes (Signed)

## 2016-03-04 ENCOUNTER — Telehealth: Payer: Self-pay | Admitting: Pain Medicine

## 2016-03-04 NOTE — Telephone Encounter (Signed)
Patient states that she is getting sick from the Hydrocodone.  Feels like she has knots in her stomach.  States she is eating with pills but this isnt helping.  Instructed patient that she could resume her Tramadol as prescribed before the change to Hydrocodone.  States she has enough to last her until her next appointment.   Instructed not to take her Hydrocodone if taking the Tramadol per Dr Laban EmperorNaveira. Patient states understanding.

## 2016-03-04 NOTE — Telephone Encounter (Signed)
meds were changed last week to oxycodone and she cannot tolerate, they are making her sick

## 2016-03-08 LAB — TOXASSURE SELECT 13 (MW), URINE: PDF: 0

## 2016-03-11 ENCOUNTER — Ambulatory Visit: Payer: Self-pay | Admitting: *Deleted

## 2016-03-11 ENCOUNTER — Encounter: Payer: Self-pay | Admitting: *Deleted

## 2016-03-11 ENCOUNTER — Other Ambulatory Visit: Payer: Self-pay | Admitting: *Deleted

## 2016-03-11 DIAGNOSIS — M6281 Muscle weakness (generalized): Secondary | ICD-10-CM | POA: Diagnosis not present

## 2016-03-11 DIAGNOSIS — J449 Chronic obstructive pulmonary disease, unspecified: Secondary | ICD-10-CM | POA: Diagnosis not present

## 2016-03-11 NOTE — Patient Outreach (Signed)
[facility]) Care Management  [facility] Care [facility]  03/11/2016   Cheryl MargaritaMary George [name] 1951/01/17 [id number]  Subjective: RN Health Coach telephone call to patient.  [place] compliance verified. Per patient she is taking medications as per ordered. Patient is not in a routine exercise program but she feels she gets a lot of exercise cleaning the house. Per patient her appetite is so so, but she refuses any type of supplemental nutrition. Per patient she is in the green zone after having to get the magnet to read.. Patient could not remember the zones until she had  gotten the magnet. Per patient she has memory loss and has always had this problem. Per patient she fell out of the bed this morning. She had gotten a few bruise but no other injuries. Per patient stated she had to stay there till her husband came and got her up. She stated  this is not the first time she has fallen. She does have a med alert.  Patient stated she does not know how to use her [facility]. Patient has agreed to follow up outreach calls   Objective:   Encounter Medications:  Outpatient Encounter Prescriptions as of 03/11/2016  Medication Sig Note  . acetaminophen (TYLENOL) 500 MG tablet Take 1,000 mg by mouth every 6 (six) hours as needed for mild pain or headache.    . albuterol (PROVENTIL HFA;VENTOLIN HFA) 108 (90 BASE) MCG/ACT inhaler Inhale 2 puffs into the lungs every 4 (four) hours as needed for wheezing or shortness of breath.   Marland Kitchen. albuterol (PROVENTIL) (2.5 MG/3ML) 0.083% nebulizer solution Take 3 mLs (2.5 mg total) by nebulization every 4 (four) hours as needed for wheezing.   Marland Kitchen. ALPRAZolam (XANAX) 0.5 MG tablet Take 0.5 mg by mouth every 4 (four) hours as needed for anxiety.    . benzonatate (TESSALON) 200 MG capsule Take 200 mg by mouth 3 (three) times daily as needed for cough.   . clopidogrel (PLAVIX) 75 MG tablet Take 75 mg by mouth daily.   Marland Kitchen. donepezil (ARICEPT) 10 MG tablet Take 10 mg by mouth  at bedtime.   Marland Kitchen. estropipate (OGEN) 0.75 MG tablet Take 1 tablet (0.75 mg total) by mouth daily.   . fentaNYL (DURAGESIC - DOSED MCG/HR) 50 MCG/HR Place 1 patch (50 mcg total) onto the skin every 3 (three) days.   . Fluticasone-Salmeterol (ADVAIR) 250-50 MCG/DOSE AEPB Inhale 1 puff into the lungs 2 (two) times daily.   . furosemide (LASIX) 40 MG tablet Take 40 mg by mouth daily.   Marland Kitchen. guaiFENesin-dextromethorphan (ROBITUSSIN DM) 100-10 MG/5ML syrup Take 5 mLs by mouth every 4 (four) hours as needed for cough.   . pantoprazole (PROTONIX) 40 MG tablet Take 40 mg by mouth daily.    . pregabalin (LYRICA) 150 MG capsule Take 1 capsule (150 mg total) by mouth 2 (two) times daily.   . simvastatin (ZOCOR) 10 MG tablet Take 10 mg by mouth at bedtime. Reported on 12/04/2015   . SUMAtriptan (IMITREX) 25 MG tablet Take 25 mg by mouth as needed for migraine. May repeat in 2 hours if headache persists or recurs.   Marland Kitchen. tiotropium (SPIRIVA) 18 MCG inhalation capsule Place 1 capsule (18 mcg total) into inhaler and inhale daily.   . diphenoxylate-atropine (LOMOTIL) 2.5-0.025 MG per tablet Take 2 tablets by mouth 4 (four) times daily as needed for diarrhea or loose stools. Reported on 03/11/2016   . HYDROcodone-acetaminophen (NORCO/VICODIN) 5-325 MG tablet Take 1 tablet by mouth every 6 (  six) hours as needed for moderate pain. (Patient not taking: Reported on 03/11/2016) 03/11/2016: Per patient it makes her sick on her stomach   No facility-administered encounter medications on file as of 03/11/2016.    Functional Status:  In your present state of health, do you have any difficulty performing the following activities: 03/11/2016 01/05/2016  Hearing? N N  Vision? N N  Difficulty concentrating or making decisions? N N  Walking or climbing stairs? Y Y  Dressing or bathing? Y Y  Doing errands, shopping? [name]  Preparing Food and eating ? N Y  Using the Toilet? N Y  In the past six months, have you accidently leaked urine? N Y   Do you have problems with loss of bowel control? N Y  Managing your Medications? N Y  Managing your Finances? [name]  Housekeeping or managing your Housekeeping? N Y    Fall/Depression Screening: PHQ 2/9 Scores 03/11/2016 02/28/2016 01/05/2016 12/04/2015 11/03/2015 09/13/2015 09/01/2015  PHQ - 2 Score 0 0 0 0 0 0 0   [facility] CM Care Plan Problem One        Most Recent Value   Care Plan Problem One  Knowledge Deficit in Self Management of COPD   Role Documenting the Problem One  Health Coach   Care Plan for Problem One  Active   [facility] Long Term Goal (31-90 days)  Patient will be able to verbalize the signs and symptoms of COPD within the next 90 days   [facility] Long Term Goal Start Date  03/11/16   Interventions for Problem One Long Term Goal  Continous re educating on zones is needed due to short term memory. RN will send additional matrial for patient to review on COPD> RN will do a discussion and teach back with each follow up call.   [facility] CM Short Term Goal #1 (0-30 days)  Patient will verbalize using the inhalers the right way within the next 30 days   [facility] CM Short Term Goal #1 Start Date  03/11/16   Interventions for Short Term Goal #1  RN will send educational material on using inhalers correctly nfor patient review   [facility] CM Short Term Goal #2 (0-30 days)  Patient will be able to verbalize and action plan to the COPD zones   [facility] CM Short Term Goal #2 Start Date  03/11/16   Interventions for Short Term Goal #2  RN will send EMMI educational material on COPD What patients  can do and When to get help.       Assessment: Patient will benefit from Health Coach telephonic outreach for education and support for diabetes self management.   Plan: RN sent EMMI information on Falls prevention and safety RN sent educational material on COPD RN sent EMMI educational material on Inhalers Using them the right way RN sent EMMI educational material on  COPD RN sent EMMI educational material on What patients can  do RN sent EMMI educational material on  COPD When to get help RN sent EMMI educational material on Cold, Flu, and Pneumonia RN will follow up outreach within 30 days RN sent information on how to use her [facility] catalog  Gean Maidens BSN RN [facility] Care Management [phone]

## 2016-03-13 ENCOUNTER — Ambulatory Visit: Payer: Commercial Managed Care - HMO | Admitting: Pain Medicine

## 2016-03-14 ENCOUNTER — Ambulatory Visit: Payer: Commercial Managed Care - HMO | Admitting: Pain Medicine

## 2016-03-16 DIAGNOSIS — N390 Urinary tract infection, site not specified: Secondary | ICD-10-CM | POA: Diagnosis not present

## 2016-03-16 DIAGNOSIS — J449 Chronic obstructive pulmonary disease, unspecified: Secondary | ICD-10-CM | POA: Diagnosis not present

## 2016-03-16 DIAGNOSIS — G609 Hereditary and idiopathic neuropathy, unspecified: Secondary | ICD-10-CM | POA: Diagnosis not present

## 2016-03-16 DIAGNOSIS — R110 Nausea: Secondary | ICD-10-CM | POA: Diagnosis not present

## 2016-03-20 ENCOUNTER — Encounter: Payer: Self-pay | Admitting: Pain Medicine

## 2016-03-20 ENCOUNTER — Ambulatory Visit: Payer: Commercial Managed Care - HMO | Attending: Pain Medicine | Admitting: Pain Medicine

## 2016-03-20 ENCOUNTER — Other Ambulatory Visit: Payer: Self-pay | Admitting: Family Medicine

## 2016-03-20 VITALS — BP 142/49 | HR 67 | Temp 98.2°F | Resp 16 | Ht 61.0 in | Wt 99.5 lb

## 2016-03-20 DIAGNOSIS — G8929 Other chronic pain: Secondary | ICD-10-CM | POA: Insufficient documentation

## 2016-03-20 DIAGNOSIS — M546 Pain in thoracic spine: Secondary | ICD-10-CM | POA: Diagnosis not present

## 2016-03-20 DIAGNOSIS — E7800 Pure hypercholesterolemia, unspecified: Secondary | ICD-10-CM | POA: Diagnosis not present

## 2016-03-20 DIAGNOSIS — F410 Panic disorder [episodic paroxysmal anxiety] without agoraphobia: Secondary | ICD-10-CM | POA: Diagnosis not present

## 2016-03-20 DIAGNOSIS — F0390 Unspecified dementia without behavioral disturbance: Secondary | ICD-10-CM | POA: Insufficient documentation

## 2016-03-20 DIAGNOSIS — D649 Anemia, unspecified: Secondary | ICD-10-CM | POA: Diagnosis not present

## 2016-03-20 DIAGNOSIS — E785 Hyperlipidemia, unspecified: Secondary | ICD-10-CM | POA: Diagnosis not present

## 2016-03-20 DIAGNOSIS — I10 Essential (primary) hypertension: Secondary | ICD-10-CM | POA: Diagnosis not present

## 2016-03-20 DIAGNOSIS — M797 Fibromyalgia: Secondary | ICD-10-CM | POA: Insufficient documentation

## 2016-03-20 DIAGNOSIS — I739 Peripheral vascular disease, unspecified: Secondary | ICD-10-CM | POA: Insufficient documentation

## 2016-03-20 DIAGNOSIS — J449 Chronic obstructive pulmonary disease, unspecified: Secondary | ICD-10-CM | POA: Insufficient documentation

## 2016-03-20 DIAGNOSIS — Z5181 Encounter for therapeutic drug level monitoring: Secondary | ICD-10-CM | POA: Diagnosis not present

## 2016-03-20 DIAGNOSIS — R1310 Dysphagia, unspecified: Secondary | ICD-10-CM | POA: Insufficient documentation

## 2016-03-20 DIAGNOSIS — E559 Vitamin D deficiency, unspecified: Secondary | ICD-10-CM | POA: Diagnosis not present

## 2016-03-20 DIAGNOSIS — G4700 Insomnia, unspecified: Secondary | ICD-10-CM | POA: Diagnosis not present

## 2016-03-20 DIAGNOSIS — I2510 Atherosclerotic heart disease of native coronary artery without angina pectoris: Secondary | ICD-10-CM | POA: Diagnosis not present

## 2016-03-20 DIAGNOSIS — M810 Age-related osteoporosis without current pathological fracture: Secondary | ICD-10-CM | POA: Insufficient documentation

## 2016-03-20 DIAGNOSIS — Z7902 Long term (current) use of antithrombotics/antiplatelets: Secondary | ICD-10-CM | POA: Insufficient documentation

## 2016-03-20 DIAGNOSIS — Z79891 Long term (current) use of opiate analgesic: Secondary | ICD-10-CM | POA: Insufficient documentation

## 2016-03-20 DIAGNOSIS — G43909 Migraine, unspecified, not intractable, without status migrainosus: Secondary | ICD-10-CM | POA: Insufficient documentation

## 2016-03-20 DIAGNOSIS — M545 Low back pain: Secondary | ICD-10-CM

## 2016-03-20 DIAGNOSIS — R531 Weakness: Secondary | ICD-10-CM | POA: Diagnosis not present

## 2016-03-20 DIAGNOSIS — R634 Abnormal weight loss: Secondary | ICD-10-CM | POA: Diagnosis not present

## 2016-03-20 DIAGNOSIS — M47816 Spondylosis without myelopathy or radiculopathy, lumbar region: Secondary | ICD-10-CM

## 2016-03-20 DIAGNOSIS — R32 Unspecified urinary incontinence: Secondary | ICD-10-CM | POA: Insufficient documentation

## 2016-03-20 DIAGNOSIS — I5031 Acute diastolic (congestive) heart failure: Secondary | ICD-10-CM | POA: Insufficient documentation

## 2016-03-20 DIAGNOSIS — I509 Heart failure, unspecified: Secondary | ICD-10-CM | POA: Insufficient documentation

## 2016-03-20 DIAGNOSIS — M47896 Other spondylosis, lumbar region: Secondary | ICD-10-CM | POA: Diagnosis not present

## 2016-03-20 DIAGNOSIS — F419 Anxiety disorder, unspecified: Secondary | ICD-10-CM | POA: Diagnosis not present

## 2016-03-20 DIAGNOSIS — F17210 Nicotine dependence, cigarettes, uncomplicated: Secondary | ICD-10-CM | POA: Insufficient documentation

## 2016-03-20 DIAGNOSIS — M549 Dorsalgia, unspecified: Secondary | ICD-10-CM | POA: Diagnosis present

## 2016-03-20 DIAGNOSIS — F1190 Opioid use, unspecified, uncomplicated: Secondary | ICD-10-CM | POA: Diagnosis not present

## 2016-03-20 DIAGNOSIS — K219 Gastro-esophageal reflux disease without esophagitis: Secondary | ICD-10-CM | POA: Insufficient documentation

## 2016-03-20 MED ORDER — FENTANYL 50 MCG/HR TD PT72: 50.0000 ug | MEDICATED_PATCH | TRANSDERMAL | Status: DC

## 2016-03-20 MED ORDER — PREGABALIN 150 MG PO CAPS: 150.0000 mg | ORAL_CAPSULE | Freq: Three times a day (TID) | ORAL | Status: DC

## 2016-03-20 NOTE — Progress Notes (Signed)
Patient's Name: Cheryl [name]  Patient type: Established  MRN: [id number]  Service setting: Ambulatory outpatient  DOB: 06/13/1951  Location: [facility]  DOS: 03/20/2016  Primary Care Physician: Mila Merry, MD  Note by: Sydnee Levans. Laban Emperor, M.D, DABA, DABAPM, DABPM, DABIPP, FIPP  Referring Physician: Malva Limes, MD  Specialty: Board-Certified Interventional Pain Management     Primary Reason(s) for Visit: Encounter for prescription drug management (Level of risk: moderate) CC: Back Pain   HPI  Cheryl [name] is a 65 y.o. year old, female patient, who returns today as an established patient. She has Essential hypertension; GERD (gastroesophageal reflux disease); Hyperlipemia; Anxiety; Airway hyperreactivity; Back pain, thoracic; Coronary artery disease; Excessive falling; Alteration in bowel elimination: incontinence; Insomnia; Decreased testosterone level; Leg weakness; Menopausal symptom; Migraine; Neuropathy (HCC); Fecal occult blood test positive; OP (osteoporosis); Panic disorder; Compulsive tobacco user syndrome; Urinary incontinence; Vitamin D deficiency; Weight loss; COPD (chronic obstructive pulmonary disease) (HCC); Dementia; Lumbar radicular pain; Chronic low back pain; Encounter for therapeutic drug level monitoring; Uncomplicated opioid dependence (HCC); Chronic pain syndrome; Hypercholesteremia; Peripheral nerve disease (HCC); Peripheral vascular disease (HCC); Platelet inhibition due to Plavix; Abnormal mammogram of right breast; Dilated intrahepatic bile duct; Dysphagia; Acute diastolic CHF (congestive heart failure) (HCC); Long term current use of opiate analgesic; Chronic pain; Fibromyalgia; Osteoarthrosis; Long term current use of anticoagulant therapy (Plavix); Long term prescription opiate use; Opiate use (160 MME/Day); Anemia; Lumbar facet syndrome (Location of Primary Source of Pain) (Bilateral); and Lumbar spondylosis on her problem list.. Her  primarily concern today is the Back Pain   Pain Assessment: Self-Reported Pain Score: 3  Reported level is compatible with observation Pain Type: Chronic pain Pain Location: Back Pain Orientation: Lower, Left Pain Descriptors / Indicators: Discomfort Pain Frequency: Constant  The patient comes into the clinics today for pharmacological management of her chronic pain. The patient wants to go back to her nonsteroidal anti-inflammatory drug because it was clearly working, however tests of the blood thinners and her cardiovascular problems we have decided to stop it. Today I talked to them about the inflammationfactor.com website.  She has been having more pain in the lower back and they have requested to have the last procedure repeated since it did provide her with close to 8 months of good pain relief.  Date of Last Visit: 02/27/16 Service Provided on Last Visit: Med Refill  Controlled Substance Pharmacotherapy Assessment & REMS (Risk Evaluation and Mitigation Strategy)  Analgesic: Duragesic 50 mcg/h every 72 2 hours. Pill Count: The patient did not bring her medications to this appointment. MME/day: 160 mg/day Date of Last Visit: 02/27/16 Pharmacokinetics: Onset of action (Liberation/Absorption): Within expected pharmacological parameters Time to Peak effect (Distribution): Timing and results are as within normal expected parameters Duration of action (Metabolism/Excretion): Within normal limits for medication Pharmacodynamics: Analgesic Effect: More than 50% Activity Facilitation: Medication(s) allow patient to sit, stand, walk, and do the basic ADLs Perceived Effectiveness: Described as relatively effective, allowing for increase in activities of daily living (ADL) Side-effects or Adverse reactions: None reported Monitoring: [place] PMP: Online review of the past 83-month period conducted. Compliant with practice rules and regulations UDS Results/interpretation: The patient's last UDS  was done on 02/28/2016 8 came back abnormal due to the presence of unreported tramadol. It also did not show any levels of alprazolam or hydrocodone. In all honesty, they hydrocodone should have been there since we had just started then. They have been made aware of the CDC guidelines and I'm  glad that the benzodiazepine is not there any longer. Medication Assessment Form: Reviewed. Patient indicates being compliant with therapy Treatment compliance: Compliant Risk Assessment: Aberrant Behavior: None observed today Substance Use Disorder (SUD) Risk Level: No change since last visit Risk of opioid abuse or dependence: 0.7-3.0% with doses ? 36 MME/day and 6.1-26% with doses ? 120 MME/day. Opioid Risk Tool (ORT) Score:  0 Low Risk for SUD (Score <3) Depression Scale Score: PHQ-2: PHQ-2 Total Score: 0 No depression (0) PHQ-9: PHQ-9 Total Score: 0 No depression (0-4)  Pharmacologic Plan: No change in therapy, at this time  Laboratory Chemistry  Inflammation Markers Lab Results  Component Value Date   ESRSEDRATE 52* 03/01/2016   CRP 0.6 03/01/2016    Renal Function Lab Results  Component Value Date   BUN 12 03/01/2016   CREATININE 0.71 03/01/2016   GFRAA >60 03/01/2016   GFRNONAA >60 03/01/2016    Hepatic Function Lab Results  Component Value Date   AST 21 03/01/2016   ALT 12* 03/01/2016   ALBUMIN 3.9 03/01/2016    Electrolytes Lab Results  Component Value Date   NA 140 03/01/2016   K 3.8 03/01/2016   CL 107 03/01/2016   CALCIUM 9.1 03/01/2016   MG 2.2 03/01/2016    Pain Modulating Vitamins Lab Results  Component Value Date   VD25OH 25.6* 01/17/2016   VITAMINB12 242 03/01/2016    Coagulation Parameters Lab Results  Component Value Date   INR 1.08 10/17/2015   LABPROT 14.2 10/17/2015    Note: I personally reviewed the above data. Results shared with patient.  Meds  The patient has a current medication list which includes the following prescription(s):  acetaminophen, albuterol, albuterol, alprazolam, benzonatate, clopidogrel, diphenoxylate-atropine, donepezil, estropipate, fentanyl, fentanyl, fentanyl, fluticasone-salmeterol, furosemide, guaifenesin-dextromethorphan, pantoprazole, pregabalin, simvastatin, sumatriptan, tiotropium, and tramadol.  Current Outpatient Prescriptions on File Prior to Visit  Medication Sig  . acetaminophen (TYLENOL) 500 MG tablet Take 1,000 mg by mouth every 6 (six) hours as needed for mild pain or headache.   . albuterol (PROVENTIL HFA;VENTOLIN HFA) 108 (90 BASE) MCG/ACT inhaler Inhale 2 puffs into the lungs every 4 (four) hours as needed for wheezing or shortness of breath.  Marland Kitchen albuterol (PROVENTIL) (2.5 MG/3ML) 0.083% nebulizer solution Take 3 mLs (2.5 mg total) by nebulization every 4 (four) hours as needed for wheezing.  Marland Kitchen ALPRAZolam (XANAX) 0.5 MG tablet Take 0.5 mg by mouth every 4 (four) hours as needed for anxiety.   . benzonatate (TESSALON) 200 MG capsule Take 200 mg by mouth 3 (three) times daily as needed for cough.  . clopidogrel (PLAVIX) 75 MG tablet Take 75 mg by mouth daily.  . diphenoxylate-atropine (LOMOTIL) 2.5-0.025 MG per tablet Take 2 tablets by mouth 4 (four) times daily as needed for diarrhea or loose stools. Reported on 03/11/2016  . donepezil (ARICEPT) 10 MG tablet Take 10 mg by mouth at bedtime.  Marland Kitchen estropipate (OGEN) 0.75 MG tablet Take 1 tablet (0.75 mg total) by mouth daily.  . Fluticasone-Salmeterol (ADVAIR) 250-50 MCG/DOSE AEPB Inhale 1 puff into the lungs 2 (two) times daily.  . furosemide (LASIX) 40 MG tablet Take 40 mg by mouth daily.  Marland Kitchen guaiFENesin-dextromethorphan (ROBITUSSIN DM) 100-10 MG/5ML syrup Take 5 mLs by mouth every 4 (four) hours as needed for cough.  . pantoprazole (PROTONIX) 40 MG tablet Take 40 mg by mouth daily.   . simvastatin (ZOCOR) 10 MG tablet Take 10 mg by mouth at bedtime. Reported on 12/04/2015  . SUMAtriptan (IMITREX) 25 MG tablet  Take 25 mg by mouth as needed for  migraine. May repeat in 2 hours if headache persists or recurs.  Marland Kitchen. tiotropium (SPIRIVA) 18 MCG inhalation capsule Place 1 capsule (18 mcg total) into inhaler and inhale daily.   No current facility-administered medications on file prior to visit.    ROS  Constitutional: Afebrile, no chills, well hydrated and well nourished Gastrointestinal: No upper or lower GI bleeding, no nausea, no vomiting and no acute GI distress Musculoskeletal: No acute joint swelling or redness, no acute loss of range of motion and no acute onset weakness Neurological: Denies any acute onset apraxia, no episodes of paralysis, no acute loss of coordination, no acute loss of consciousness and no acute onset aphasia, dysarthria, agnosia, or amnesia  Allergies  [name] is allergic to percocet; aspirin; codeine; morphine; propoxyphene; and sulfa antibiotics.  PFSH  Medical:  [name]  has a past medical history of COPD (chronic obstructive pulmonary disease) (HCC); Hypertension; Arthritis; Hyperlipidemia; GERD (gastroesophageal reflux disease); Neuropathy (HCC) (2010); Oxygen deficiency; Coronary artery disease; Headache; Anxiety; Peripheral vascular disease (HCC); Vitamin D deficiency; Migraines; Chronic pain; DVT (deep venous thrombosis) (HCC); Pneumonia; Osteoporosis; Allergy; Asthma; Aspiration pneumonitis (HCC) (11/24/2015); and Pneumonia (11/19/2015). Family: family history includes Arthritis in her mother; Cancer in her brother, brother, and mother; Diabetes in her brother, mother, and sister; Heart disease in her father and mother. Surgical:  has past surgical history that includes Appendectomy; Spine surgery; Foot surgery (Bilateral); cardiac catherization (10/31/2009); abdomnal aortic stent (05/30/2008); Abdominal hysterectomy (1975); Cholecystectomy (1972); Cervical fusion (C5 - 6/C6-7); Appendectomy; Colonoscopy with propofol (N/A, 07/27/2015); and Esophagogastroduodenoscopy (egd) with propofol (N/A,  07/27/2015). Tobacco:  reports that she has been smoking Cigarettes.  She has a 50 pack-year smoking history. She does not have any smokeless tobacco history on file. Alcohol:  reports that she does not drink alcohol. Drug:  reports that she does not use illicit drugs.  Physical Examination  Constitutional Vitals: Blood pressure 142/49, pulse 67, temperature 98.2 F (36.8 C), resp. rate 16, height 5\' 1"  (1.549 m), weight 99 lb 8 oz (45.133 kg), SpO2 97 %. Calculated BMI: Body mass index is 18.81 kg/(m^2). (18.5-24.9 kg/m2) Ideal body weight General appearance: Alert, cooperative, oriented x 3, in no acute distress, well nourished, well developed, well hydrated Eyes: PERLA Respiratory: No evidence respiratory distress, no audible rales or ronchi and no use of accessory muscles of respiration Psych: Alert, oriented to person, oriented to place and oriented to time  Cervical Spine Exam  Inspection: Normal anatomy, no anomalies observed Cervical Lordosis: Normal Alignment: Symetrical Functional ROM: Within functional limits (WFL) AROM: WFL Sensory: No sensory anomalies reported or detected  Upper Extremity Exam    Right  Left  Inspection: No gross anomalies detected  Inspection: No gross anomalies detected  Functional ROM: Adequate  Functional ROM: Adequate  AROM: Adequate  AROM: Adequate  Sensory: No sensory anomalies reported or detected  Sensory: No sensory anomalies reported or detected  Motor: Unremarkable  Motor: Unremarkable  Vascular: Normal skin color, temperature, and hair growth. No peripheral edema or cyanosis  Vascular: Normal skin color, temperature, and hair growth. No peripheral edema or cyanosis   Thoracic Spine  Inspection: Significant thoracic kyphosis. Alignment: Symetrical Functional ROM: Within functional limits [facility](WFL) AROM: Adequate Palpation: WNL  Lumbar Spine  Inspection: No gross anomalies detected Alignment: Symetrical Functional ROM: Within functional  limits (WFL) AROM: Decreased Sensory: No sensory anomalies reported or detected Palpation: Tender Provocative Tests: Lumbar Hyperextension and rotation test: Positive for bilateral  lumbar facet pain. Patrick's Maneuver: deferred  Gait Assessment  Gait: Antalgic gaite using a cane as an assistive device  Lower Extremities    Right  Left  Inspection: No gross anomalies detected  Inspection: No gross anomalies detected  Functional ROM: Within functional limits [facility])  Functional ROM: Within functional limits (WFL)  AROM: Adequate  AROM: Adequate  Sensory: No sensory anomalies reported or detected  Sensory: No sensory anomalies reported or detected  Motor: Unremarkable  Motor: Unremarkable  Toe walk (S1): WNL  Toe walk (S1): WNL  Heal walk (L5): WNL  Heal walk (L5): WNL   Assessment & Plan  Primary Diagnosis & Pertinent Problem List: The primary encounter diagnosis was Chronic pain. Diagnoses of Encounter for therapeutic drug level monitoring, Long term current use of opiate analgesic, Opiate use (160 MME/Day), Fibromyalgia, Lumbar facet syndrome (Location of Primary Source of Pain) (Bilateral), and Lumbar spondylosis, unspecified spinal osteoarthritis were also pertinent to this visit.  Visit Diagnosis: 1. Chronic pain   2. Encounter for therapeutic drug level monitoring   3. Long term current use of opiate analgesic   4. Opiate use (160 MME/Day)   5. Fibromyalgia   6. Lumbar facet syndrome (Location of Primary Source of Pain) (Bilateral)   7. Lumbar spondylosis, unspecified spinal osteoarthritis     Problems updated and reviewed during this visit: Problem  Lumbar facet syndrome (Location of Primary Source of Pain) (Bilateral)  Lumbar Spondylosis    Problem-specific Plan(s): No problem-specific assessment & plan notes found for this encounter.  No new assessment & plan notes have been filed under this hospital service since the last note was generated. Service: Pain  Management   Plan of Care   Problem List Items Addressed This Visit      High   Chronic pain - Primary (Chronic)   Relevant Medications   traMADol (ULTRAM) 50 MG tablet   pregabalin (LYRICA) 150 MG capsule   fentaNYL (DURAGESIC - DOSED MCG/HR) 50 MCG/HR   fentaNYL (DURAGESIC - DOSED MCG/HR) 50 MCG/HR   fentaNYL (DURAGESIC - DOSED MCG/HR) 50 MCG/HR   Fibromyalgia (Chronic)   Relevant Medications   pregabalin (LYRICA) 150 MG capsule   Lumbar facet syndrome (Location of Primary Source of Pain) (Bilateral) (Chronic)   Relevant Medications   traMADol (ULTRAM) 50 MG tablet   fentaNYL (DURAGESIC - DOSED MCG/HR) 50 MCG/HR   fentaNYL (DURAGESIC - DOSED MCG/HR) 50 MCG/HR   fentaNYL (DURAGESIC - DOSED MCG/HR) 50 MCG/HR   Other Relevant Orders   LUMBAR FACET(MEDIAL BRANCH NERVE BLOCK) MBNB   Lumbar spondylosis (Chronic)   Relevant Medications   traMADol (ULTRAM) 50 MG tablet   fentaNYL (DURAGESIC - DOSED MCG/HR) 50 MCG/HR   fentaNYL (DURAGESIC - DOSED MCG/HR) 50 MCG/HR   fentaNYL (DURAGESIC - DOSED MCG/HR) 50 MCG/HR   Other Relevant Orders   LUMBAR FACET(MEDIAL BRANCH NERVE BLOCK) MBNB     Medium   Encounter for therapeutic drug level monitoring   Long term current use of opiate analgesic (Chronic)   Relevant Orders   ToxASSURE Select 13 (MW), Urine   Opiate use (160 MME/Day) (Chronic)       Pharmacotherapy (Medications Ordered): Meds ordered this encounter  Medications  . pregabalin (LYRICA) 150 MG capsule    Sig: Take 1 capsule (150 mg total) by mouth every 8 (eight) hours.    Dispense:  90 capsule    Refill:  2    Do not place this medication, or any other prescription from our practice, on "  Automatic Refill". Patient may have prescription filled one day early if pharmacy is closed on scheduled refill date.  . fentaNYL (DURAGESIC - DOSED MCG/HR) 50 MCG/HR    Sig: Place 1 patch (50 mcg total) onto the skin every 3 (three) days.    Dispense:  10 patch    Refill:  0    Do  not place this medication, or any other prescription from our practice, on "Automatic Refill". Patient may have prescription filled one day early if pharmacy is closed on scheduled refill date. Do not fill until: 04/15/16 To last until: 05/15/16  . fentaNYL (DURAGESIC - DOSED MCG/HR) 50 MCG/HR    Sig: Place 1 patch (50 mcg total) onto the skin every 3 (three) days.    Dispense:  10 patch    Refill:  0    Do not place this medication, or any other prescription from our practice, on "Automatic Refill". Patient may have prescription filled one day early if pharmacy is closed on scheduled refill date. Do not fill until: 05/15/16 To last until: 06/14/16  . fentaNYL (DURAGESIC - DOSED MCG/HR) 50 MCG/HR    Sig: Place 1 patch (50 mcg total) onto the skin every 3 (three) days.    Dispense:  10 patch    Refill:  0    Do not place this medication, or any other prescription from our practice, on "Automatic Refill". Patient may have prescription filled one day early if pharmacy is closed on scheduled refill date. Do not fill until: 06/14/16 To last until: 07/14/16    [facility] & Procedure Ordered: Orders Placed This Encounter  Procedures  . LUMBAR FACET(MEDIAL BRANCH NERVE BLOCK) MBNB  . ToxASSURE Select 13 (MW), Urine    Imaging Ordered: None  Interventional Therapies: Scheduled:  Diagnostic bilateral lumbar facet block under fluoroscopic guidance and IV sedation. Stop the Plavix for 7 days prior to coming in.    Considering:  Lumbar facet radiofrequency ablation.    PRN Procedures:  Possible lumbar epidural steroid injection under fluoroscopic guidance with or without sedation the pending of the patient's choice. Need to stop the Plavix for 7-10 days prior to that.    Referral(s) or Consult(s): None at this time.  New Prescriptions   No medications on file    Medications administered during this visit: Ms. Saffo had no medications administered during this visit.  Future  Appointments Date Time Provider Department Center  04/09/2016 1:45 PM Malva Limes, MD [facility] None  04/10/2016 11:00 AM Luella Cook, RN [facility] None  07/03/2016 8:40 AM Delano Metz, MD [facility] None    Primary Care Physician: Mila Merry, MD Location: [facility] Note by: Sydnee Levans. Laban Emperor, M.D, DABA, DABAPM, DABPM, DABIPP, FIPP  Pain Score Disclaimer: We use the NRS-11 scale. This is a self-reported, subjective measurement of pain severity with only modest accuracy. It is used primarily to identify changes within a particular patient. It must be understood that outpatient pain scales are significantly less accurate that those used for research, where they can be applied under ideal controlled circumstances with minimal exposure to variables. In reality, the score is likely to be a combination of pain intensity and pain affect, where pain affect describes the degree of emotional arousal or changes in action readiness caused by the sensory experience of pain. Factors such as social and work situation, setting, emotional state, anxiety levels, expectation, and prior pain experience may influence pain perception and show large inter-individual differences that may also be  affected by time variables.

## 2016-03-20 NOTE — Progress Notes (Signed)
Safety precautions to be maintained throughout the outpatient stay will include: orient to surroundings, keep bed in low position, maintain call bell within reach at all times, provide assistance with transfer out of bed and ambulation.  

## 2016-03-20 NOTE — Patient Instructions (Addendum)
Smoking Cessation, Tips for Success If you are ready to quit smoking, congratulations! You have chosen to help yourself be healthier. Cigarettes bring nicotine, tar, carbon monoxide, and other irritants into your body. Your lungs, heart, and blood vessels will be able to work better without these poisons. There are many different ways to quit smoking. Nicotine gum, nicotine patches, a nicotine inhaler, or nicotine nasal spray can help with physical craving. Hypnosis, support groups, and medicines help break the habit of smoking. WHAT THINGS CAN I DO TO MAKE QUITTING EASIER?  Here are some tips to help you quit for good: 1. Pick a date when you will quit smoking completely. Tell all of your friends and family about your plan to quit on that date. 2. Do not try to slowly cut down on the number of cigarettes you are smoking. Pick a quit date and quit smoking completely starting on that day. 3. Throw away all cigarettes.  4. Clean and remove all ashtrays from your home, work, and car. 5. On a card, write down your reasons for quitting. Carry the card with you and read it when you get the urge to smoke. 6. Cleanse your body of nicotine. Drink enough water and fluids to keep your urine clear or pale yellow. Do this after quitting to flush the nicotine from your body. 7. Learn to predict your moods. Do not let a bad situation be your excuse to have a cigarette. Some situations in your life might tempt you into wanting a cigarette. 8. Never have "just one" cigarette. It leads to wanting another and another. Remind yourself of your decision to quit. 9. Change habits associated with smoking. If you smoked while driving or when feeling stressed, try other activities to replace smoking. Stand up when drinking your coffee. Brush your teeth after eating. Sit in a different chair when you read the paper. Avoid alcohol while trying to quit, and try to drink fewer caffeinated beverages. Alcohol and caffeine may urge you  to smoke. 10. Avoid foods and drinks that can trigger a desire to smoke, such as sugary or spicy foods and alcohol. 11. Ask people who smoke not to smoke around you. 12. Have something planned to do right after eating or having a cup of coffee. For example, plan to take a walk or exercise. 13. Try a relaxation exercise to calm you down and decrease your stress. Remember, you may be tense and nervous for the first 2 weeks after you quit, but this will pass. 14. Find new activities to keep your hands busy. Play with a pen, coin, or rubber band. Doodle or draw things on paper. 15. Brush your teeth right after eating. This will help cut down on the craving for the taste of tobacco after meals. You can also try mouthwash.  16. Use oral substitutes in place of cigarettes. Try using lemon drops, carrots, cinnamon sticks, or chewing gum. Keep them handy so they are available when you have the urge to smoke. 17. When you have the urge to smoke, try deep breathing. 18. Designate your home as a nonsmoking area. 19. If you are a heavy smoker, ask your health care provider about a prescription for nicotine chewing gum. It can ease your withdrawal from nicotine. 20. Reward yourself. Set aside the cigarette money you save and buy yourself something nice. 21. Look for support from others. Join a support group or smoking cessation program. Ask someone at home or at work to help you with your plan   to quit smoking. 22. Always ask yourself, "Do I need this cigarette or is this just a reflex?" Tell yourself, "Today, I choose not to smoke," or "I do not want to smoke." You are reminding yourself of your decision to quit. 23. Do not replace cigarette smoking with electronic cigarettes (commonly called e-cigarettes). The safety of e-cigarettes is unknown, and some may contain harmful chemicals. 24. If you relapse, do not give up! Plan ahead and think about what you will do the next time you get the urge to smoke. HOW WILL  I FEEL WHEN I QUIT SMOKING? You may have symptoms of withdrawal because your body is used to nicotine (the addictive substance in cigarettes). You may crave cigarettes, be irritable, feel very hungry, cough often, get headaches, or have difficulty concentrating. The withdrawal symptoms are only temporary. They are strongest when you first quit but will go away within 10-14 days. When withdrawal symptoms occur, stay in control. Think about your reasons for quitting. Remind yourself that these are signs that your body is healing and getting used to being without cigarettes. Remember that withdrawal symptoms are easier to treat than the major diseases that smoking can cause.  Even after the withdrawal is over, expect periodic urges to smoke. However, these cravings are generally short lived and will go away whether you smoke or not. Do not smoke! WHAT RESOURCES ARE AVAILABLE TO HELP ME QUIT SMOKING? Your health care provider can direct you to community resources or hospitals for support, which may include:  Group support.  Education.  Hypnosis.  Therapy.   This information is not intended to replace advice given to you by your health care provider. Make sure you discuss any questions you have with your health care provider.   Document Released: 08/09/2004 Document Revised: 12/02/2014 Document Reviewed: 04/29/2013 Elsevier Interactive Patient Education [employer]2016 [employer]  Stop Plavix 7 days before your procedure. Facet Blocks Patient Information  Description: The facets are joints in the spine between the vertebrae.  Like any joints in the body, facets can become irritated and painful.  Arthritis can also effect the facets.  By injecting steroids and local anesthetic in and around these joints, we can temporarily block the nerve supply to them.  Steroids act directly on irritated nerves and tissues to reduce selling and inflammation which often leads to decreased pain.  Facet blocks may be done  anywhere along the spine from the neck to the low back depending upon the location of your pain.   After numbing the skin with local anesthetic (like Novocaine), a small needle is passed onto the facet joints under x-ray guidance.  You may experience a sensation of pressure while this is being done.  The entire block usually lasts about 15-25 minutes.   Conditions which may be treated by facet blocks:   Low back/buttock pain  Neck/shoulder pain  Certain types of headaches  Preparation for the injection:  1. Do not eat any solid food or dairy products within 8 hours of your appointment. 2. You may drink clear liquid up to 3 hours before appointment.  Clear liquids include water, black coffee, juice or soda.  No milk or cream please. 3. You may take your regular medication, including pain medications, with a sip of water before your appointment.  Diabetics should hold regular insulin (if taken separately) and take 1/2 normal NPH dose the morning of the procedure.  Carry some sugar containing items with you to your appointment. 4. A driver must accompany  you and be prepared to drive you home after your procedure. 5. Bring all your current medications with you. 6. An IV may be inserted and sedation may be given at the discretion of the physician. 7. A blood pressure cuff, EKG and other monitors will often be applied during the procedure.  Some patients may need to have extra oxygen administered for a short period. 8. You will be asked to provide medical information, including your allergies and medications, prior to the procedure.  We must know immediately if you are taking blood thinners (like Coumadin/Warfarin) or if you are allergic to IV iodine contrast (dye).  We must know if you could possible be pregnant.  Possible side-effects:   Bleeding from needle site  Infection (rare, may require surgery)  Nerve injury (rare)  Numbness & tingling (temporary)  Difficulty urinating (rare,  temporary)  Spinal headache (a headache worse with upright posture)  Light-headedness (temporary)  Pain at injection site (serveral days)  Decreased blood pressure (rare, temporary)  Weakness in arm/leg (temporary)  Pressure sensation in back/neck (temporary)   Call if you experience:   Fever/chills associated with headache or increased back/neck pain  Headache worsened by an upright position  New onset, weakness or numbness of an extremity below the injection site  Hives or difficulty breathing (go to the emergency room)  Inflammation or drainage at the injection site(s)  Severe back/neck pain greater than usual  New symptoms which are concerning to you  Please note:  Although the local anesthetic injected can often make your back or neck feel good for several hours after the injection, the pain will likely return. It takes 3-7 days for steroids to work.  You may not notice any pain relief for at least one week.  If effective, we will often do a series of 2-3 injections spaced 3-6 weeks apart to maximally decrease your pain.  After the initial series, you may be a candidate for a more permanent nerve block of the facets.  If you have any questions, please call [phone] [facility] You were given 3 prescriptions for Duragesic patch today, and 1 prescription for Lyrica with 2 refills.

## 2016-03-21 NOTE — Telephone Encounter (Signed)
Please call in alprazolam.  

## 2016-03-21 NOTE — Telephone Encounter (Signed)
Rx called in to pharmacy. 

## 2016-03-27 LAB — TOXASSURE SELECT 13 (MW), URINE

## 2016-04-03 ENCOUNTER — Telehealth: Payer: Self-pay | Admitting: Pain Medicine

## 2016-04-03 NOTE — Telephone Encounter (Signed)
Spoke with Dr Laban EmperorNaveira about patient not getting her tramadol.  Patient states she will talk about it when she comes in for her procedure.

## 2016-04-03 NOTE — Telephone Encounter (Signed)
Came in for meds refill bu;t did not get tramadol script please call

## 2016-04-05 IMAGING — CR DG TIBIA/FIBULA 2V*R*
1 series · 2 of 2 positions shown · non-contrast
Comparison: None.

CLINICAL DATA: RIGHT leg pain 2 weeks.  No known injury.

EXAM:
RIGHT TIBIA AND FIBULA - 2 VIEW

[Series 1: dg tibia/fibula right · 0.14mm/px · 2 of 2 slices shown]
[im 1/2]
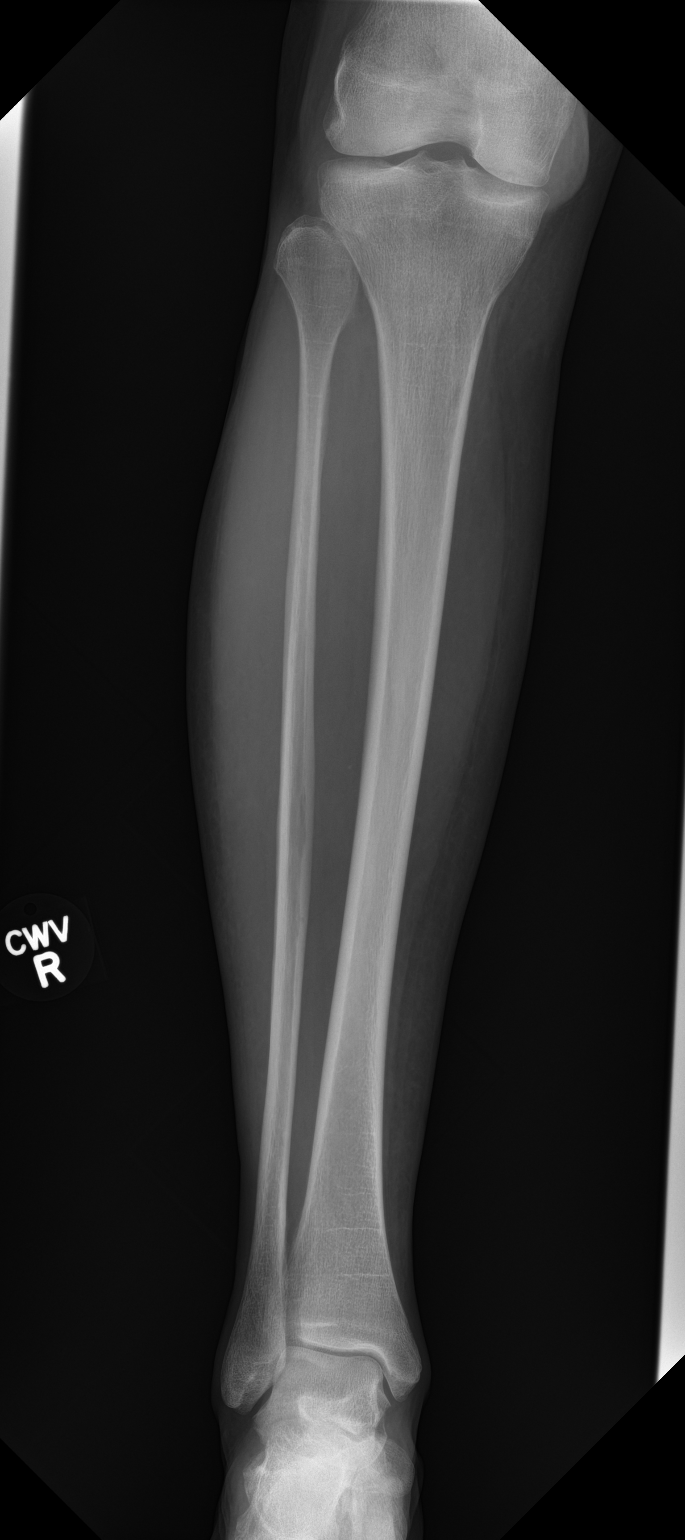
[im 2/2]
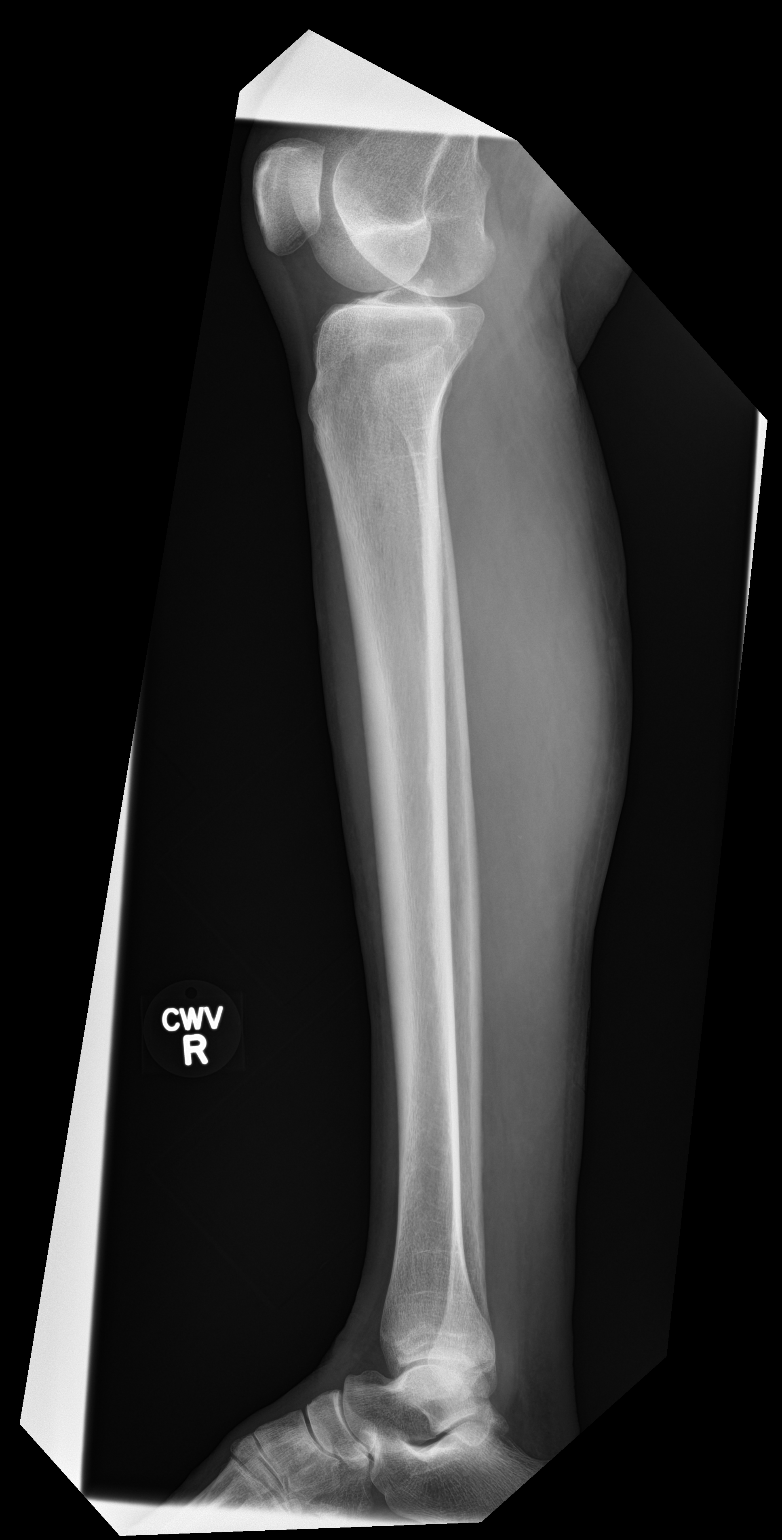

[2 of 2 positions shown; findings below may reference images not displayed]

FINDINGS: There is no evidence of fracture or other focal bone lesions. Soft
tissues are unremarkable.
IMPRESSION: Negative.

## 2016-04-09 ENCOUNTER — Ambulatory Visit (INDEPENDENT_AMBULATORY_CARE_PROVIDER_SITE_OTHER): Payer: Commercial Managed Care - HMO | Admitting: Family Medicine

## 2016-04-09 ENCOUNTER — Encounter: Payer: Self-pay | Admitting: Family Medicine

## 2016-04-09 ENCOUNTER — Ambulatory Visit
Admission: RE | Admit: 2016-04-09 | Discharge: 2016-04-09 | Disposition: A | Discharge status: Home / Self Care | Payer: Commercial Managed Care - HMO | Source: Ambulatory Visit | Attending: Family Medicine | Admitting: Family Medicine

## 2016-04-09 ENCOUNTER — Ambulatory Visit: Payer: Commercial Managed Care - HMO | Admitting: *Deleted

## 2016-04-09 VITALS — BP 128/60 | HR 79 | Temp 98.0°F | Resp 16 | Wt 102.0 lb

## 2016-04-09 DIAGNOSIS — M79645 Pain in left finger(s): Secondary | ICD-10-CM

## 2016-04-09 DIAGNOSIS — M19042 Primary osteoarthritis, left hand: Secondary | ICD-10-CM | POA: Insufficient documentation

## 2016-04-09 DIAGNOSIS — J3489 Other specified disorders of nose and nasal sinuses: Secondary | ICD-10-CM

## 2016-04-09 DIAGNOSIS — M1812 Unilateral primary osteoarthritis of first carpometacarpal joint, left hand: Secondary | ICD-10-CM | POA: Diagnosis not present

## 2016-04-09 DIAGNOSIS — G894 Chronic pain syndrome: Secondary | ICD-10-CM | POA: Diagnosis not present

## 2016-04-09 DIAGNOSIS — D649 Anemia, unspecified: Secondary | ICD-10-CM | POA: Diagnosis not present

## 2016-04-09 MED ORDER — FLUTICASONE PROPIONATE 50 MCG/ACT NA SUSP: 2.0000 | Freq: Every day | NASAL | Status: DC

## 2016-04-09 NOTE — Patient Instructions (Signed)
Go to the [facility] on [address] for left thumb Xray

## 2016-04-09 NOTE — Progress Notes (Signed)
Patient: Cheryl [name] Female    DOB: 29-Apr-1951   65 y.o.   MRN: [id number] Visit Date: 04/09/2016  Today's Provider: Mila Merry, MD   Chief Complaint  Patient presents with  . Follow-up    Vitamin D deficiency  . Arthritis    follow up  . Anemia    follow up   Subjective:    HPI Follow Up Vitamin D deficiency: Last office visit was 3 months ago and no changes were made. Patient reports poor compliance with treatment. Patient has not been taking Vitamin D supplement for several months now.  Follow up Arthritis: Patient comes in stating her Arthritis pain has worsened. She states the pain clinic took her off Tramadol. She is particularly concerned about her left thumb ;which has been very painful and sometimes swollen.   Follow up Anemia: Last office visit was 3 months ago and no changes were made.   Follow up Leukocytosis: Last office visit was 3 months ago. Labs were ordered and patients blood count was almost back to normal.   Lab Results  Component Value Date   WBC 5.6 01/17/2016   HGB 8.3* 11/23/2015   HCT 30.9* 01/17/2016   MCV 89 01/17/2016   PLT 388* 01/17/2016   She also complains of persistent clear nasal drainage for several months. Not taken any OTC allergy meds. No congestion. Has 1-2 migraines a month, but otherwise no frequent headaches.       Allergies  Allergen Reactions  . Percocet [Oxycodone-Acetaminophen] Hives and Rash  . Aspirin Nausea And Vomiting and Other (See Comments)    Reaction:  GI upset   . Codeine Nausea And Vomiting and Other (See Comments)    Reaction:  GI upset   . Morphine Other (See Comments)    Reaction:  Unknown   . Propoxyphene Other (See Comments) and Nausea Only    GI upset Reaction:  GI upset   . Sulfa Antibiotics Rash and Other (See Comments)    Reaction:  GI upset    Previous Medications   ACETAMINOPHEN (TYLENOL) 500 MG TABLET    Take 1,000 mg by mouth every 6 (six) hours as needed for mild pain or  headache.    ALBUTEROL (PROVENTIL HFA;VENTOLIN HFA) 108 (90 BASE) MCG/ACT INHALER    Inhale 2 puffs into the lungs every 4 (four) hours as needed for wheezing or shortness of breath.   ALBUTEROL (PROVENTIL) (2.5 MG/3ML) 0.083% NEBULIZER SOLUTION    Take 3 mLs (2.5 mg total) by nebulization every 4 (four) hours as needed for wheezing.   ALPRAZOLAM (XANAX) 0.5 MG TABLET    take 1 tablet by mouth every 6 hours if needed   CLOPIDOGREL (PLAVIX) 75 MG TABLET    Take 75 mg by mouth daily.   DIPHENOXYLATE-ATROPINE (LOMOTIL) 2.5-0.025 MG PER TABLET    Take 2 tablets by mouth 4 (four) times daily as needed for diarrhea or loose stools. Reported on 03/11/2016   DONEPEZIL (ARICEPT) 10 MG TABLET    Take 10 mg by mouth at bedtime.   ESTROPIPATE (OGEN) 0.75 MG TABLET    Take 1 tablet (0.75 mg total) by mouth daily.   FENTANYL (DURAGESIC - DOSED MCG/HR) 50 MCG/HR    Place 1 patch (50 mcg total) onto the skin every 3 (three) days.   FLUTICASONE-SALMETEROL (ADVAIR) 250-50 MCG/DOSE AEPB    Inhale 1 puff into the lungs 2 (two) times daily.   FUROSEMIDE (LASIX) 40 MG TABLET  Take 40 mg by mouth daily.   PANTOPRAZOLE (PROTONIX) 40 MG TABLET    Take 40 mg by mouth daily.    PREGABALIN (LYRICA) 150 MG CAPSULE    Take 1 capsule (150 mg total) by mouth every 8 (eight) hours.   SIMVASTATIN (ZOCOR) 10 MG TABLET    Take 10 mg by mouth at bedtime. Reported on 12/04/2015   SUMATRIPTAN (IMITREX) 25 MG TABLET    Take 25 mg by mouth as needed for migraine. May repeat in 2 hours if headache persists or recurs.   TIOTROPIUM (SPIRIVA) 18 MCG INHALATION CAPSULE    Place 1 capsule (18 mcg total) into inhaler and inhale daily.    Review of Systems  Constitutional: Positive for diaphoresis and fatigue. Negative for fever, chills and appetite change.  HENT: Positive for rhinorrhea (nose occasionally runny with brown colored mucus).   Respiratory: Negative for chest tightness and shortness of breath.   Cardiovascular: Positive for leg  swelling. Negative for chest pain and palpitations.  Gastrointestinal: Negative for nausea, vomiting and abdominal pain.  Musculoskeletal: Positive for arthralgias.  Neurological: Positive for headaches. Negative for dizziness, weakness and light-headedness.    Social History  Substance Use Topics  . Smoking status: Current Every Day Smoker -- 1.00 packs/day for 50 years    Types: Cigarettes  . Smokeless tobacco: Not on file  . Alcohol Use: No   Objective:   BP 128/60 mmHg  Pulse 79  Temp(Src) 98 F (36.7 C) (Oral)  Resp 16  Wt 102 lb (46.267 kg)  SpO2 98%  Physical Exam  General Appearance:    Alert, cooperative, no distress  HENT:   ENT exam normal, no neck nodes or sinus tenderness, neck without nodes, sinuses nontender and nasal mucosa pale and congested. Small amount clear discharge.   Eyes:    PERRL, conjunctiva/corneas clear, EOM's intact       Lungs:     Clear to auscultation bilaterally, respirations unlabored  Heart:    Regular rate and rhythm  Neurologic:   Awake, alert, oriented x 3. No apparent focal neurological           defect.   MS:   Very tender left first MCP.        Assessment & Plan:     1. Thumb pain, left  - DG Finger Thumb Left; Future  2. Anemia, unspecified anemia type  - CBC  3. Nasal drainage Suspect secondary to allergic rhinitis. Rx fluticasone nasal spray. Advised to call if not clearing up after being on nasal spray for 2 weeks.   4. Chronic pain syndrome Advised her that cannot take NSAIDs on regular basis do to being on Plavix. Advised that I could not prescribe any controlled medication including tramadol as she is being managed at the pain clinic.  Recommend she contact Dr. Chesley MiresNaviera's office about tramadol as I don't see any record of why it was discontinued, and patient's does know either.  She is no longer taking hydrocodone due to adverse effects.       Mila Merryonald Naira Standiford, MD  [facility]

## 2016-04-10 ENCOUNTER — Other Ambulatory Visit: Payer: Self-pay | Admitting: *Deleted

## 2016-04-10 ENCOUNTER — Telehealth: Payer: Self-pay

## 2016-04-10 ENCOUNTER — Ambulatory Visit: Payer: Self-pay | Admitting: *Deleted

## 2016-04-10 DIAGNOSIS — G609 Hereditary and idiopathic neuropathy, unspecified: Secondary | ICD-10-CM | POA: Diagnosis not present

## 2016-04-10 DIAGNOSIS — G3184 Mild cognitive impairment, so stated: Secondary | ICD-10-CM | POA: Diagnosis not present

## 2016-04-10 DIAGNOSIS — M6281 Muscle weakness (generalized): Secondary | ICD-10-CM | POA: Diagnosis not present

## 2016-04-10 DIAGNOSIS — J449 Chronic obstructive pulmonary disease, unspecified: Secondary | ICD-10-CM | POA: Diagnosis not present

## 2016-04-10 DIAGNOSIS — R296 Repeated falls: Secondary | ICD-10-CM | POA: Diagnosis not present

## 2016-04-10 LAB — CBC
HEMOGLOBIN: 11 g/dL — AB (ref 11.1–15.9)
Hematocrit: 33.6 % — ABNORMAL LOW (ref 34.0–46.6)
MCH: 28.1 pg (ref 26.6–33.0)
MCHC: 32.7 g/dL (ref 31.5–35.7)
MCV: 86 fL (ref 79–97)
Platelets: 296 10*3/uL (ref 150–379)
RBC: 3.92 x10E6/uL (ref 3.77–5.28)
RDW: 18.1 % — ABNORMAL HIGH (ref 12.3–15.4)
WBC: 4.1 10*3/uL (ref 3.4–10.8)

## 2016-04-10 NOTE — Telephone Encounter (Signed)
Patient states that she is in pain and she needs to have something else for pain. She is not able to sleep at tonight due to pain. Please advise.

## 2016-04-10 NOTE — Telephone Encounter (Signed)
Pts husband called and he wants to know what should his wife dor pain since Dr. Laban EmperorNaveira took her off tramadol. Pt is in a lot of pain and is not sleeping at night

## 2016-04-10 NOTE — Patient Outreach (Signed)
[facility] Care Management  04/10/2016  Cheryl MargaritaMary [name] [place]on [birth year] [phone]  RN Health Coach telephone call to patient.  [employer] compliance verified. Per patient she is doing good but she is in the Dr office now. RN informed her that she would call her back Cheryl MaidensFrances Aidan [name] BSN RN [facility] Care Management [phone] .

## 2016-04-11 ENCOUNTER — Telehealth: Payer: Self-pay | Admitting: *Deleted

## 2016-04-11 DIAGNOSIS — Z1389 Encounter for screening for other disorder: Secondary | ICD-10-CM

## 2016-04-11 NOTE — Telephone Encounter (Signed)
-----   Message from Malva Limesonald E Fisher, MD sent at 04/11/2016  8:17 AM EDT ----- Anemia is stable. Xray shows damage to thumb joint either from severe arthritis or gout. Need to check uric acid levels. Can check with [facility] to see if uric acid can be added to labs drawn on 5-16. If not will need to return to lab to have blood drawn again.

## 2016-04-11 NOTE — Telephone Encounter (Signed)
Patient was notified of results. Unable to add uric acid to previous order. Ordered lab and placed slip at front desk for pt to pick-up.

## 2016-04-12 DIAGNOSIS — Z1389 Encounter for screening for other disorder: Secondary | ICD-10-CM | POA: Diagnosis not present

## 2016-04-13 LAB — URIC ACID: Uric Acid: 5.9 mg/dL (ref 2.5–7.1)

## 2016-04-15 ENCOUNTER — Other Ambulatory Visit: Payer: Self-pay | Admitting: Family Medicine

## 2016-04-15 DIAGNOSIS — G609 Hereditary and idiopathic neuropathy, unspecified: Secondary | ICD-10-CM | POA: Diagnosis not present

## 2016-04-15 DIAGNOSIS — J449 Chronic obstructive pulmonary disease, unspecified: Secondary | ICD-10-CM | POA: Diagnosis not present

## 2016-04-15 DIAGNOSIS — N390 Urinary tract infection, site not specified: Secondary | ICD-10-CM | POA: Diagnosis not present

## 2016-04-15 DIAGNOSIS — M79645 Pain in left finger(s): Secondary | ICD-10-CM

## 2016-04-15 DIAGNOSIS — R110 Nausea: Secondary | ICD-10-CM | POA: Diagnosis not present

## 2016-04-15 NOTE — Progress Notes (Unsigned)
Please refer to rheumatology for pain swelling hands.

## 2016-04-23 ENCOUNTER — Encounter: Payer: Self-pay | Admitting: Pain Medicine

## 2016-04-23 ENCOUNTER — Other Ambulatory Visit: Payer: Self-pay | Admitting: Family Medicine

## 2016-04-23 ENCOUNTER — Ambulatory Visit: Payer: Commercial Managed Care - HMO | Attending: Pain Medicine | Admitting: Pain Medicine

## 2016-04-23 VITALS — BP 119/60 | HR 65 | Temp 97.7°F | Resp 12 | Ht 61.0 in | Wt 102.0 lb

## 2016-04-23 DIAGNOSIS — I739 Peripheral vascular disease, unspecified: Secondary | ICD-10-CM | POA: Insufficient documentation

## 2016-04-23 DIAGNOSIS — G8929 Other chronic pain: Secondary | ICD-10-CM | POA: Insufficient documentation

## 2016-04-23 DIAGNOSIS — R928 Other abnormal and inconclusive findings on diagnostic imaging of breast: Secondary | ICD-10-CM | POA: Diagnosis not present

## 2016-04-23 DIAGNOSIS — Z79891 Long term (current) use of opiate analgesic: Secondary | ICD-10-CM | POA: Insufficient documentation

## 2016-04-23 DIAGNOSIS — I10 Essential (primary) hypertension: Secondary | ICD-10-CM | POA: Diagnosis not present

## 2016-04-23 DIAGNOSIS — Z780 Asymptomatic menopausal state: Secondary | ICD-10-CM | POA: Diagnosis not present

## 2016-04-23 DIAGNOSIS — J449 Chronic obstructive pulmonary disease, unspecified: Secondary | ICD-10-CM | POA: Insufficient documentation

## 2016-04-23 DIAGNOSIS — M546 Pain in thoracic spine: Secondary | ICD-10-CM | POA: Diagnosis not present

## 2016-04-23 DIAGNOSIS — Z9181 History of falling: Secondary | ICD-10-CM | POA: Diagnosis not present

## 2016-04-23 DIAGNOSIS — I2510 Atherosclerotic heart disease of native coronary artery without angina pectoris: Secondary | ICD-10-CM | POA: Diagnosis not present

## 2016-04-23 DIAGNOSIS — F17210 Nicotine dependence, cigarettes, uncomplicated: Secondary | ICD-10-CM | POA: Insufficient documentation

## 2016-04-23 DIAGNOSIS — R195 Other fecal abnormalities: Secondary | ICD-10-CM | POA: Insufficient documentation

## 2016-04-23 DIAGNOSIS — F419 Anxiety disorder, unspecified: Secondary | ICD-10-CM | POA: Insufficient documentation

## 2016-04-23 DIAGNOSIS — F0390 Unspecified dementia without behavioral disturbance: Secondary | ICD-10-CM | POA: Insufficient documentation

## 2016-04-23 DIAGNOSIS — E559 Vitamin D deficiency, unspecified: Secondary | ICD-10-CM | POA: Insufficient documentation

## 2016-04-23 DIAGNOSIS — E7800 Pure hypercholesterolemia, unspecified: Secondary | ICD-10-CM | POA: Diagnosis not present

## 2016-04-23 DIAGNOSIS — D649 Anemia, unspecified: Secondary | ICD-10-CM | POA: Diagnosis not present

## 2016-04-23 DIAGNOSIS — R634 Abnormal weight loss: Secondary | ICD-10-CM | POA: Diagnosis not present

## 2016-04-23 DIAGNOSIS — G43909 Migraine, unspecified, not intractable, without status migrainosus: Secondary | ICD-10-CM | POA: Diagnosis not present

## 2016-04-23 DIAGNOSIS — M47816 Spondylosis without myelopathy or radiculopathy, lumbar region: Secondary | ICD-10-CM | POA: Insufficient documentation

## 2016-04-23 DIAGNOSIS — R32 Unspecified urinary incontinence: Secondary | ICD-10-CM | POA: Insufficient documentation

## 2016-04-23 DIAGNOSIS — Z7902 Long term (current) use of antithrombotics/antiplatelets: Secondary | ICD-10-CM | POA: Insufficient documentation

## 2016-04-23 DIAGNOSIS — M549 Dorsalgia, unspecified: Secondary | ICD-10-CM | POA: Diagnosis present

## 2016-04-23 DIAGNOSIS — I5031 Acute diastolic (congestive) heart failure: Secondary | ICD-10-CM | POA: Insufficient documentation

## 2016-04-23 DIAGNOSIS — M545 Low back pain: Secondary | ICD-10-CM | POA: Insufficient documentation

## 2016-04-23 DIAGNOSIS — M797 Fibromyalgia: Secondary | ICD-10-CM | POA: Insufficient documentation

## 2016-04-23 DIAGNOSIS — G4700 Insomnia, unspecified: Secondary | ICD-10-CM | POA: Insufficient documentation

## 2016-04-23 DIAGNOSIS — E785 Hyperlipidemia, unspecified: Secondary | ICD-10-CM | POA: Diagnosis not present

## 2016-04-23 DIAGNOSIS — M810 Age-related osteoporosis without current pathological fracture: Secondary | ICD-10-CM | POA: Insufficient documentation

## 2016-04-23 DIAGNOSIS — R531 Weakness: Secondary | ICD-10-CM | POA: Insufficient documentation

## 2016-04-23 DIAGNOSIS — R296 Repeated falls: Secondary | ICD-10-CM | POA: Diagnosis not present

## 2016-04-23 DIAGNOSIS — J45909 Unspecified asthma, uncomplicated: Secondary | ICD-10-CM | POA: Insufficient documentation

## 2016-04-23 DIAGNOSIS — K219 Gastro-esophageal reflux disease without esophagitis: Secondary | ICD-10-CM | POA: Diagnosis not present

## 2016-04-23 DIAGNOSIS — F410 Panic disorder [episodic paroxysmal anxiety] without agoraphobia: Secondary | ICD-10-CM | POA: Diagnosis not present

## 2016-04-23 MED ORDER — TRIAMCINOLONE ACETONIDE 40 MG/ML IJ SUSP
40.0000 mg | Freq: Once | INTRAMUSCULAR | Status: AC
  Administered 2016-04-23: 40 mg
  Filled 2016-04-23: qty 2

## 2016-04-23 MED ORDER — PREGABALIN 150 MG PO CAPS: 150.0000 mg | ORAL_CAPSULE | Freq: Three times a day (TID) | ORAL | Status: DC

## 2016-04-23 MED ORDER — FENTANYL 50 MCG/HR TD PT72: 50.0000 ug | MEDICATED_PATCH | TRANSDERMAL | Status: DC

## 2016-04-23 MED ORDER — LIDOCAINE HCL (PF) 1 % IJ SOLN
10.0000 mL | Freq: Once | INTRAMUSCULAR | Status: AC
  Administered 2016-04-23: 10 mL

## 2016-04-23 MED ORDER — TRAMADOL HCL 50 MG PO TABS: 100.0000 mg | ORAL_TABLET | Freq: Four times a day (QID) | ORAL | Status: DC | PRN

## 2016-04-23 MED ORDER — FENTANYL CITRATE (PF) 100 MCG/2ML IJ SOLN
25.0000 ug | INTRAMUSCULAR | Status: DC | PRN
  Filled 2016-04-23: qty 2

## 2016-04-23 MED ORDER — LACTATED RINGERS IV SOLN: 1000.0000 mL | Freq: Once | INTRAVENOUS | Status: DC

## 2016-04-23 MED ORDER — MIDAZOLAM HCL 5 MG/5ML IJ SOLN
1.0000 mg | INTRAMUSCULAR | Status: DC | PRN
  Filled 2016-04-23: qty 5

## 2016-04-23 MED ORDER — ROPIVACAINE HCL 2 MG/ML IJ SOLN
9.0000 mL | Freq: Once | INTRAMUSCULAR | Status: AC
  Administered 2016-04-23: 9 mL via EPIDURAL
  Filled 2016-04-23: qty 20

## 2016-04-23 NOTE — Progress Notes (Signed)
Patient here for procedure d/t lower back pain. Safety precautions to be maintained throughout the outpatient stay will include: orient to surroundings, keep bed in low position, maintain call bell within reach at all times, provide assistance with transfer out of bed and ambulation.  

## 2016-04-23 NOTE — Patient Instructions (Signed)
Pain Management Discharge Instructions  General Discharge Instructions :  If you need to reach your doctor call: Monday-Friday 8:00 am - 4:00 pm at [phone] or toll free [phone].  After clinic hours [phone] to have operator reach doctor.  Bring all of your medication bottles to all your appointments in the pain clinic.  To cancel or reschedule your appointment with Pain Management please remember to call 24 hours in advance to avoid a fee.  Refer to the educational materials which you have been given on: General Risks, I had my Procedure. Discharge Instructions, Post Sedation.  Post Procedure Instructions:  The drugs you were given will stay in your system until tomorrow, so for the next 24 hours you should not drive, make any legal decisions or drink any alcoholic beverages.  You may eat anything you prefer, but it is better to start with liquids then soups and crackers, and gradually work up to solid foods.  Please notify your doctor immediately if you have any unusual bleeding, trouble breathing or pain that is not related to your normal pain.  Depending on the type of procedure that was done, some parts of your body may feel week and/or numb.  This usually clears up by tonight or the next day.  Walk with the use of an assistive device or accompanied by an adult for the 24 hours.  You may use ice on the affected area for the first 24 hours.  Put ice in a Ziploc bag and cover with a towel and place against area 15 minutes on 15 minutes off.  You may switch to heat after 24 hours.GENERAL RISKS AND COMPLICATIONS  What are the risk, side effects and possible complications? Generally speaking, most procedures are safe.  However, with any procedure there are risks, side effects, and the possibility of complications.  The risks and complications are dependent upon the sites that are lesioned, or the type of nerve block to be performed.  The closer the procedure is to the spine,  the more serious the risks are.  Great care is taken when placing the radio frequency needles, block needles or lesioning probes, but sometimes complications can occur. 1. Infection: Any time there is an injection through the skin, there is a risk of infection.  This is why sterile conditions are used for these blocks.  There are four possible types of infection. 1. Localized skin infection. 2. Central Nervous System Infection-This can be in the form of Meningitis, which can be deadly. 3. Epidural Infections-This can be in the form of an epidural abscess, which can cause pressure inside of the spine, causing compression of the spinal cord with subsequent paralysis. This would require an emergency surgery to decompress, and there are no guarantees that the patient would recover from the paralysis. 4. Discitis-This is an infection of the intervertebral discs.  It occurs in about 1% of discography procedures.  It is difficult to treat and it may lead to surgery.        2. Pain: the needles have to go through skin and soft tissues, will cause soreness.       3. Damage to internal structures:  The nerves to be lesioned may be near blood vessels or    other nerves which can be potentially damaged.       4. Bleeding: Bleeding is more common if the patient is taking blood thinners such as  aspirin, Coumadin, Ticiid, Plavix, etc., or if he/she have some genetic predisposition  such as   hemophilia. Bleeding into the spinal canal can cause compression of the spinal  cord with subsequent paralysis.  This would require an emergency surgery to  decompress and there are no guarantees that the patient would recover from the  paralysis.       5. Pneumothorax:  Puncturing of a lung is a possibility, every time a needle is introduced in  the area of the chest or upper back.  Pneumothorax refers to free air around the  collapsed lung(s), inside of the thoracic cavity (chest cavity).  Another two possible  complications  related to a similar event would include: Hemothorax and Chylothorax.   These are variations of the Pneumothorax, where instead of air around the collapsed  lung(s), you may have blood or chyle, respectively.       6. Spinal headaches: They may occur with any procedures in the area of the spine.       7. Persistent CSF (Cerebro-Spinal Fluid) leakage: This is a rare problem, but may occur  with prolonged intrathecal or epidural catheters either due to the formation of a fistulous  track or a dural tear.       8. Nerve damage: By working so close to the spinal cord, there is always a possibility of  nerve damage, which could be as serious as a permanent spinal cord injury with  paralysis.       9. Death:  Although rare, severe deadly allergic reactions known as "Anaphylactic  reaction" can occur to any of the medications used.      10. Worsening of the symptoms:  We can always make thing worse.  What are the chances of something like this happening? Chances of any of this occuring are extremely low.  By statistics, you have more of a chance of getting killed in a motor vehicle accident: while driving to the hospital than any of the above occurring .  Nevertheless, you should be aware that they are possibilities.  In general, it is similar to taking a shower.  Everybody knows that you can slip, hit your head and get killed.  Does that mean that you should not shower again?  Nevertheless always keep in mind that statistics do not mean anything if you happen to be on the wrong side of them.  Even if a procedure has a 1 (one) in a 1,000,000 (million) chance of going wrong, it you happen to be that one..Also, keep in mind that by statistics, you have more of a chance of having something go wrong when taking medications.  Who should not have this procedure? If you are on a blood thinning medication (e.g. Coumadin, Plavix, see list of "Blood Thinners"), or if you have an active infection going on, you should not  have the procedure.  If you are taking any blood thinners, please inform your physician.  How should I prepare for this procedure?  Do not eat or drink anything at least six hours prior to the procedure.  Bring a driver with you .  It cannot be a taxi.  Come accompanied by an adult that can drive you back, and that is strong enough to help you if your legs get weak or numb from the local anesthetic.  Take all of your medicines the morning of the procedure with just enough water to swallow them.  If you have diabetes, make sure that you are scheduled to have your procedure done first thing in the morning, whenever possible.  If you have diabetes,   take only half of your insulin dose and notify our nurse that you have done so as soon as you arrive at the clinic.  If you are diabetic, but only take blood sugar pills (oral hypoglycemic), then do not take them on the morning of your procedure.  You may take them after you have had the procedure.  Do not take aspirin or any aspirin-containing medications, at least eleven (11) days prior to the procedure.  They may prolong bleeding.  Wear loose fitting clothing that may be easy to take off and that you would not mind if it got stained with Betadine or blood.  Do not wear any jewelry or perfume  Remove any nail coloring.  It will interfere with some of our monitoring equipment.  NOTE: Remember that this is not meant to be interpreted as a complete list of all possible complications.  Unforeseen problems may occur.  BLOOD THINNERS The following drugs contain aspirin or other products, which can cause increased bleeding during surgery and should not be taken for 2 weeks prior to and 1 week after surgery.  If you should need take something for relief of minor pain, you may take acetaminophen which is found in Tylenol,m Datril, Anacin-3 and Panadol. It is not blood thinner. The products listed below are.  Do not take any of the products listed below  in addition to any listed on your instruction sheet.  A.P.C or A.P.C with Codeine Codeine Phosphate Capsules #3 Ibuprofen Ridaura  ABC compound Congesprin Imuran rimadil  Advil Cope Indocin Robaxisal  Alka-Seltzer Effervescent Pain Reliever and Antacid Coricidin or Coricidin-D  Indomethacin Rufen  Alka-Seltzer plus Cold Medicine Cosprin Ketoprofen S-A-C Tablets  Anacin Analgesic Tablets or Capsules Coumadin Korlgesic Salflex  Anacin Extra Strength Analgesic tablets or capsules CP-2 Tablets Lanoril Salicylate  Anaprox Cuprimine Capsules Levenox Salocol  Anexsia-D Dalteparin Magan Salsalate  Anodynos Darvon compound Magnesium Salicylate Sine-off  Ansaid Dasin Capsules Magsal Sodium Salicylate  Anturane Depen Capsules Marnal Soma  APF Arthritis pain formula Dewitt's Pills Measurin Stanback  Argesic Dia-Gesic Meclofenamic Sulfinpyrazone  Arthritis Bayer Timed Release Aspirin Diclofenac Meclomen Sulindac  Arthritis pain formula Anacin Dicumarol Medipren Supac  Analgesic (Safety coated) Arthralgen Diffunasal Mefanamic Suprofen  Arthritis Strength Bufferin Dihydrocodeine Mepro Compound Suprol  Arthropan liquid Dopirydamole Methcarbomol with Aspirin Synalgos  ASA tablets/Enseals Disalcid Micrainin Tagament  Ascriptin Doan's Midol Talwin  Ascriptin A/D Dolene Mobidin Tanderil  Ascriptin Extra Strength Dolobid Moblgesic Ticlid  Ascriptin with Codeine Doloprin or Doloprin with Codeine Momentum Tolectin  Asperbuf Duoprin Mono-gesic Trendar  Aspergum Duradyne Motrin or Motrin IB Triminicin  Aspirin plain, buffered or enteric coated Durasal Myochrisine Trigesic  Aspirin Suppositories Easprin Nalfon Trillsate  Aspirin with Codeine Ecotrin Regular or Extra Strength Naprosyn Uracel  Atromid-S Efficin Naproxen Ursinus  Auranofin Capsules Elmiron Neocylate Vanquish  Axotal Emagrin Norgesic Verin  Azathioprine Empirin or Empirin with Codeine Normiflo Vitamin E  Azolid Emprazil Nuprin Voltaren  Bayer  Aspirin plain, buffered or children's or timed BC Tablets or powders Encaprin Orgaran Warfarin Sodium  Buff-a-Comp Enoxaparin Orudis Zorpin  Buff-a-Comp with Codeine Equegesic Os-Cal-Gesic   Buffaprin Excedrin plain, buffered or Extra Strength Oxalid   Bufferin Arthritis Strength Feldene Oxphenbutazone   Bufferin plain or Extra Strength Feldene Capsules Oxycodone with Aspirin   Bufferin with Codeine Fenoprofen Fenoprofen Pabalate or Pabalate-SF   Buffets II Flogesic Panagesic   Buffinol plain or Extra Strength Florinal or Florinal with Codeine Panwarfarin   Buf-Tabs Flurbiprofen Penicillamine   Butalbital Compound Four-way cold tablets   Penicillin   Butazolidin Fragmin Pepto-Bismol   Carbenicillin Geminisyn Percodan   Carna Arthritis Reliever Geopen Persantine   Carprofen Gold's salt Persistin   Chloramphenicol Goody's Phenylbutazone   Chloromycetin Haltrain Piroxlcam   Clmetidine heparin Plaquenil   Cllnoril Hyco-pap Ponstel   Clofibrate Hydroxy chloroquine Propoxyphen         Before stopping any of these medications, be sure to consult the physician who ordered them.  Some, such as Coumadin (Warfarin) are ordered to prevent or treat serious conditions such as "deep thrombosis", "pumonary embolisms", and other heart problems.  The amount of time that you may need off of the medication may also vary with the medication and the reason for which you were taking it.  If you are taking any of these medications, please make sure you notify your pain physician before you undergo any procedures.  Prescriptions given Fentanyl, tramadol; and lyrica

## 2016-04-23 NOTE — Progress Notes (Signed)
Patient's Name: Cheryl [name]  Patient type: Established  MRN: [id number]  Service setting: Ambulatory outpatient  DOB: 03-22-1951  Location: [facility]  DOS: 04/23/2016  Primary Care Physician: Lelon Huh, MD  Note by: Kathlen Brunswick. Dossie Arbour, M.D, DABA, DABAPM, DABPM, Milagros Evener, FIPP  Referring Physician: Milinda Pointer, MD  Specialty: Board-Certified Interventional Pain Management  Last Visit to Pain Management: 04/10/2016   Primary Reason(s) for Visit: Interventional Pain Management Treatment. CC: Back Pain  Primary Diagnosis: Facet syndrome, lumbar [M54.5]   Procedure:  Anesthesia, Analgesia, Anxiolysis:  Type: Diagnostic Medial Branch Facet Block Region: Lumbar Level: L2, L3, L4, L5, & S1 Medial Branch Level(s) Laterality: Bilateral  Indications: 1. Lumbar facet syndrome (Location of Primary Source of Pain) (Bilateral)   2. Lumbar spondylosis, unspecified spinal osteoarthritis   3. Chronic low back pain   4. Chronic pain   5. Fibromyalgia     Pre-procedure Pain Score: 3/10 Reported level of pain is compatible with clinical observations Post-procedure Pain Score: 0-No pain  Type: Moderate (Conscious) Sedation & Local Anesthesia Local Anesthetic: Lidocaine 1% Route: Intravenous (IV) IV Access: Secured Sedation: Meaningful verbal contact was maintained at all times during the procedure  Indication(s): Analgesia & Anxiolysis   Pre-Procedure Assessment:  Cheryl [name] is a 65 y.o. year old, female patient, seen today for interventional treatment. She has Essential hypertension; GERD (gastroesophageal reflux disease); Hyperlipemia; Anxiety; Airway hyperreactivity; Back pain, thoracic; Coronary artery disease; Excessive falling; Alteration in bowel elimination: incontinence; Insomnia; Decreased testosterone level; Leg weakness; Menopausal symptom; Migraine; Neuropathy ([place]); Fecal occult blood test positive; OP (osteoporosis); Panic disorder; Compulsive  tobacco user syndrome; Urinary incontinence; Vitamin D deficiency; Weight loss; COPD (chronic obstructive pulmonary disease) ([place]); Dementia; Lumbar radicular pain; Chronic low back pain; Encounter for therapeutic drug level monitoring; Uncomplicated opioid dependence ([place]); Chronic pain syndrome; Hypercholesteremia; Peripheral nerve disease ([place]); Peripheral vascular disease ([place]); Platelet inhibition due to Plavix; Abnormal mammogram of right breast; Dilated intrahepatic bile duct; Dysphagia; Acute diastolic CHF (congestive heart failure) ([place]); Long term current use of opiate analgesic; Chronic pain; Fibromyalgia; Osteoarthrosis; Long term current use of anticoagulant therapy (Plavix); Long term prescription opiate use; Opiate use (160 MME/Day); Anemia; Lumbar facet syndrome (Location of Primary Source of Pain) (Bilateral); and Lumbar spondylosis on her problem list.. Her primarily concern today is the Back Pain   Pain Type: Chronic pain Pain Location: Back Pain Orientation: Lower Pain Descriptors / Indicators: Constant, Discomfort, Sharp Pain Frequency: Constant  Date of Last Visit: 03/20/16 Service Provided on Last Visit: Evaluation, Med Refill  Verification of the correct person, correct site (including marking of site), and correct procedure were performed and confirmed by the patient.  Consent: Secured. Under the influence of no sedatives a written informed consent was obtained, after having provided information on the risks and possible complications. To fulfill our ethical and legal obligations, as recommended by the American Medical Association's Code of Ethics, we have provided information to the patient about our clinical impression; the nature and purpose of the treatment or procedure; the risks, benefits, and possible complications of the intervention; alternatives; the risk(s) and benefit(s) of the alternative treatment(s) or procedure(s); and the risk(s) and benefit(s) of doing  nothing. The patient was provided information about the risks and possible complications associated with the procedure. These include, but are not limited to, failure to achieve desired goals, infection, bleeding, organ or nerve damage, allergic reactions, paralysis, and death. In the case of spinal procedures these may include, but are not limited to,  failure to achieve desired goals, infection, bleeding, organ or nerve damage, allergic reactions, paralysis, and death. In addition, the patient was informed that Medicine is not an exact science; therefore, there is also the possibility of unforeseen risks and possible complications that may result in a catastrophic outcome. The patient indicated having understood very clearly. We have given the patient no guarantees and we have made no promises. Enough time was given to the patient to ask questions, all of which were answered to the patient's satisfaction.  Consent Attestation: I, the ordering provider, attest that I have discussed with the patient the benefits, risks, side-effects, alternatives, likelihood of achieving goals, and potential problems during recovery for the procedure that I have provided informed consent.  Pre-Procedure Preparation: Safety Precautions: Allergies reviewed. Appropriate site, procedure, and patient were confirmed by following the Joint Commission's Universal Protocol (UP.01.01.01), in the form of a "Time Out". The patient was asked to confirm marked site and procedure, before commencing. The patient was asked about blood thinners, or active infections, both of which were denied. Patient was assessed for positional comfort and all pressure points were checked before starting procedure. Allergies: She is allergic to percocet; aspirin; codeine; morphine; propoxyphene; and sulfa antibiotics.. Infection Control Precautions: Sterile technique used. Standard Universal Precautions were taken as recommended by the Department of [facility] for Disease Control and Prevention (CDC). Standard pre-surgical skin prep was conducted. Respiratory hygiene and cough etiquette was practiced. Hand hygiene observed. Safe injection practices and needle disposal techniques followed. SDV (single dose vial) medications used. Medications properly checked for expiration dates and contaminants. Personal protective equipment (PPE) used: Sterile Radiation-resistant gloves. Monitoring:  As per clinic protocol. Filed Vitals:   04/23/16 1009 04/23/16 1018 04/23/16 1028 04/23/16 1039  BP: 121/72 123/68 119/65 119/60  Pulse: 79 78 75 65  Temp:  98.1 F (36.7 C)  97.7 F (36.5 C)  TempSrc:      Resp: '9 14 12 12  '$ Height:      Weight:      SpO2: 100% 100% 100% 98%  Calculated BMI: Body mass index is 19.28 kg/(m^2).  Description of Procedure Process:   Time-out: "Time-out" completed before starting procedure, as per protocol. Position: Prone Target Area: For Lumbar Facet blocks, the target is the groove formed by the junction of the transverse process and superior articular process. For the L5 dorsal ramus, the target is the notch between superior articular process and sacral ala. For the S1 dorsal ramus, the target is the superior and lateral edge of the posterior S1 Sacral foramen. Approach: Paramedial approach. Area Prepped: Entire Posterior Lumbosacral Region Prepping solution: ChloraPrep (2% chlorhexidine gluconate and 70% isopropyl alcohol) Safety Precautions: Aspiration looking for blood return was conducted prior to all injections. At no point did we inject any substances, as a needle was being advanced. No attempts were made at seeking any paresthesias. Safe injection practices and needle disposal techniques used. Medications properly checked for expiration dates. SDV (single dose vial) medications used.   Description of the Procedure: Protocol guidelines were followed. The patient was placed in position over the fluoroscopy table.  The target area was identified and the area prepped in the usual manner. Skin desensitized using vapocoolant spray. Skin & deeper tissues infiltrated with local anesthetic. Appropriate amount of time allowed to pass for local anesthetics to take effect. The procedure needle was introduced through the skin, ipsilateral to the reported pain, and advanced to the target area. Employing the "Medial Branch Technique", the needles  were advanced to the angle made by the superior and medial portion of the transverse process, and the lateral and inferior portion of the superior articulating process of the targeted vertebral bodies. This area is known as "Burton's Eye" or the "Eye of the [place] Dog". A procedure needle was introduced through the skin, and this time advanced to the angle made by the superior and medial border of the sacral ala, and the lateral border of the S1 vertebral body. This last needle was later repositioned at the superior and lateral border of the posterior S1 foramen. Negative aspiration confirmed. Solution injected in intermittent fashion, asking for systemic symptoms every 0.5cc of injectate. The needles were then removed and the area cleansed, making sure to leave some of the prepping solution back to take advantage of its long term bactericidal properties. EBL: None Materials & Medications Used:  Needle(s) Used: 22g - 3.5" Spinal Needle(s)  Imaging Guidance:   Type of Imaging Technique: Fluoroscopy Guidance (Spinal) Indication(s): Assistance in needle guidance and placement for procedures requiring needle placement in or near specific anatomical locations not easily accessible without such assistance. Exposure Time: Please see nurses notes. Contrast: None required. Fluoroscopic Guidance: I was personally present in the fluoroscopy suite, where the patient was placed in position for the procedure, over the fluoroscopy-compatible table. Fluoroscopy was manipulated, using "Tunnel Vision  Technique", to obtain the best possible view of the target area, on the affected side. Parallax error was corrected before commencing the procedure. A "direction-depth-direction" technique was used to introduce the needle under continuous pulsed fluoroscopic guidance. Once the target was reached, antero-posterior, oblique, and lateral fluoroscopic projection views were taken to confirm needle placement in all planes. Permanently recorded images stored by scanning into EMR. Interpretation: Intraoperative imaging interpretation by performing Physician. Adequate needle placement confirmed. Adequate needle placement confirmed in AP, lateral, & Oblique Views. No contrast injected.  Antibiotic Prophylaxis:  Indication(s): No indications identified. Type:  Antibiotics Given (last 72 hours)    None       Post-operative Assessment:   Complications: No immediate post-treatment complications were observed. Disposition: Return to clinic for follow-up evaluation. The patient tolerated the entire procedure well. A repeat set of vitals were taken after the procedure and the patient was kept under observation following institutional policy, for this procedure. Post-procedural neurological assessment was performed, showing return to baseline, prior to discharge. The patient was discharged home, once institutional criteria were met. The patient was provided with post-procedure discharge instructions, including a section on how to identify potential problems. Should any problems arise concerning this procedure, the patient was given instructions to immediately contact us, at any time, without hesitation. In any case, we plan to contact the patient by telephone for a follow-up status report regarding this interventional procedure. Comments:  No additional relevant information.  Medications administered during this visit: We administered triamcinolone acetonide, lidocaine (PF), and ropivacaine (PF) 2 mg/ml  (0.2%).  Prescriptions ordered during this visit: New Prescriptions   FENTANYL (DURAGESIC - DOSED MCG/HR) 50 MCG/HR    Place 1 patch (50 mcg total) onto the skin every 3 (three) days.   TRAMADOL (ULTRAM) 50 MG TABLET    Take 2 tablets (100 mg total) by mouth every 6 (six) hours as needed for severe pain.    Requested PM Follow-up: Return in about 2 months (around 07/03/2016) for Medication Management, Post-Procedure Eval (2 weeks).  Future Appointments Date Time Provider [place]  05/06/2016 9:40 AM Milinda Pointer, MD [facility] None  07/03/2016 8:40  AM Milinda Pointer, MD [facility] None    Primary Care Physician: Lelon Huh, MD Location: [facility] Note by: Kathlen Brunswick. Dossie Arbour, M.D, DABA, DABAPM, DABPM, DABIPP, FIPP   Illustration of the posterior view of the lumbar spine and the posterior neural structures. Laminae of L2 through S1 are labeled. DPRL5, dorsal primary ramus of L5; DPRS1, dorsal primary ramus of S1; DPR3, dorsal primary ramus of L3; FJ, facet (zygapophyseal) joint L3-L4; I, inferior articular process of L4; LB1, lateral branch of dorsal primary ramus of L1; IAB, inferior articular branches from L3 medial branch (supplies L4-L5 facet joint); IBP, intermediate branch plexus; MB3, medial branch of dorsal primary ramus of L3; NR3, third lumbar nerve root; S, superior articular process of L5; SAB, superior articular branches from L4 (supplies L4-5 facet joint also); TP3, transverse process of L3.  Disclaimer:  Medicine is not an Chief Strategy Officer. The only guarantee in medicine is that nothing is guaranteed. It is important to note that the decision to proceed with this intervention was based on the information collected from the patient. The Data and conclusions were drawn from the patient's questionnaire, the interview, and the physical examination. Because the information was provided in large part by the patient, it cannot be guaranteed  that it has not been purposely or unconsciously manipulated. Every effort has been made to obtain as much relevant data as possible for this evaluation. It is important to note that the conclusions that lead to this procedure are derived in large part from the available data. Always take into account that the treatment will also be dependent on availability of resources and existing treatment guidelines, considered by other Pain Management Practitioners as being common knowledge and practice, at the time of the intervention. For Medico-Legal purposes, it is also important to point out that variation in procedural techniques and pharmacological choices are the acceptable norm. The indications, contraindications, technique, and results of the above procedure should only be interpreted and judged by a Board-Certified Interventional Pain Specialist with extensive familiarity and expertise in the same exact procedure and technique. Attempts at providing opinions without similar or greater experience and expertise than that of the treating physician will be considered as inappropriate and unethical, and shall result in a formal complaint to the state medical board and applicable specialty societies.

## 2016-04-24 ENCOUNTER — Telehealth: Payer: Self-pay | Admitting: *Deleted

## 2016-04-24 NOTE — Telephone Encounter (Signed)
No problems post procedure. 

## 2016-04-29 DIAGNOSIS — I739 Peripheral vascular disease, unspecified: Secondary | ICD-10-CM | POA: Diagnosis not present

## 2016-04-29 DIAGNOSIS — I10 Essential (primary) hypertension: Secondary | ICD-10-CM | POA: Diagnosis not present

## 2016-04-29 DIAGNOSIS — I2510 Atherosclerotic heart disease of native coronary artery without angina pectoris: Secondary | ICD-10-CM | POA: Diagnosis not present

## 2016-04-29 DIAGNOSIS — K219 Gastro-esophageal reflux disease without esophagitis: Secondary | ICD-10-CM | POA: Diagnosis not present

## 2016-04-29 DIAGNOSIS — I340 Nonrheumatic mitral (valve) insufficiency: Secondary | ICD-10-CM | POA: Diagnosis not present

## 2016-05-06 ENCOUNTER — Encounter: Payer: Self-pay | Admitting: Pain Medicine

## 2016-05-06 ENCOUNTER — Ambulatory Visit: Payer: Commercial Managed Care - HMO | Attending: Pain Medicine | Admitting: Pain Medicine

## 2016-05-06 VITALS — BP 131/70 | HR 64 | Temp 98.2°F | Resp 16 | Ht 61.0 in | Wt 104.0 lb

## 2016-05-06 DIAGNOSIS — R296 Repeated falls: Secondary | ICD-10-CM | POA: Diagnosis not present

## 2016-05-06 DIAGNOSIS — G8929 Other chronic pain: Secondary | ICD-10-CM | POA: Diagnosis not present

## 2016-05-06 DIAGNOSIS — R0902 Hypoxemia: Secondary | ICD-10-CM | POA: Diagnosis not present

## 2016-05-06 DIAGNOSIS — F0390 Unspecified dementia without behavioral disturbance: Secondary | ICD-10-CM | POA: Insufficient documentation

## 2016-05-06 DIAGNOSIS — I10 Essential (primary) hypertension: Secondary | ICD-10-CM | POA: Insufficient documentation

## 2016-05-06 DIAGNOSIS — M797 Fibromyalgia: Secondary | ICD-10-CM | POA: Insufficient documentation

## 2016-05-06 DIAGNOSIS — R928 Other abnormal and inconclusive findings on diagnostic imaging of breast: Secondary | ICD-10-CM | POA: Diagnosis not present

## 2016-05-06 DIAGNOSIS — E785 Hyperlipidemia, unspecified: Secondary | ICD-10-CM | POA: Insufficient documentation

## 2016-05-06 DIAGNOSIS — G4700 Insomnia, unspecified: Secondary | ICD-10-CM | POA: Insufficient documentation

## 2016-05-06 DIAGNOSIS — M47816 Spondylosis without myelopathy or radiculopathy, lumbar region: Secondary | ICD-10-CM | POA: Diagnosis not present

## 2016-05-06 DIAGNOSIS — K219 Gastro-esophageal reflux disease without esophagitis: Secondary | ICD-10-CM | POA: Diagnosis not present

## 2016-05-06 DIAGNOSIS — F419 Anxiety disorder, unspecified: Secondary | ICD-10-CM | POA: Insufficient documentation

## 2016-05-06 DIAGNOSIS — R159 Full incontinence of feces: Secondary | ICD-10-CM | POA: Diagnosis not present

## 2016-05-06 DIAGNOSIS — F17210 Nicotine dependence, cigarettes, uncomplicated: Secondary | ICD-10-CM | POA: Diagnosis not present

## 2016-05-06 DIAGNOSIS — E559 Vitamin D deficiency, unspecified: Secondary | ICD-10-CM | POA: Diagnosis not present

## 2016-05-06 DIAGNOSIS — M545 Low back pain, unspecified: Secondary | ICD-10-CM

## 2016-05-06 DIAGNOSIS — E7800 Pure hypercholesterolemia, unspecified: Secondary | ICD-10-CM | POA: Diagnosis not present

## 2016-05-06 DIAGNOSIS — M810 Age-related osteoporosis without current pathological fracture: Secondary | ICD-10-CM | POA: Insufficient documentation

## 2016-05-06 DIAGNOSIS — M549 Dorsalgia, unspecified: Secondary | ICD-10-CM | POA: Diagnosis present

## 2016-05-06 DIAGNOSIS — R32 Unspecified urinary incontinence: Secondary | ICD-10-CM | POA: Insufficient documentation

## 2016-05-06 DIAGNOSIS — G43909 Migraine, unspecified, not intractable, without status migrainosus: Secondary | ICD-10-CM | POA: Diagnosis not present

## 2016-05-06 DIAGNOSIS — I2510 Atherosclerotic heart disease of native coronary artery without angina pectoris: Secondary | ICD-10-CM | POA: Diagnosis not present

## 2016-05-06 DIAGNOSIS — J45909 Unspecified asthma, uncomplicated: Secondary | ICD-10-CM | POA: Insufficient documentation

## 2016-05-06 DIAGNOSIS — R531 Weakness: Secondary | ICD-10-CM | POA: Diagnosis not present

## 2016-05-06 DIAGNOSIS — R634 Abnormal weight loss: Secondary | ICD-10-CM | POA: Insufficient documentation

## 2016-05-06 DIAGNOSIS — Z86718 Personal history of other venous thrombosis and embolism: Secondary | ICD-10-CM | POA: Diagnosis not present

## 2016-05-06 DIAGNOSIS — I739 Peripheral vascular disease, unspecified: Secondary | ICD-10-CM | POA: Insufficient documentation

## 2016-05-06 DIAGNOSIS — Z7901 Long term (current) use of anticoagulants: Secondary | ICD-10-CM | POA: Insufficient documentation

## 2016-05-06 DIAGNOSIS — I5032 Chronic diastolic (congestive) heart failure: Secondary | ICD-10-CM | POA: Diagnosis not present

## 2016-05-06 DIAGNOSIS — J449 Chronic obstructive pulmonary disease, unspecified: Secondary | ICD-10-CM | POA: Insufficient documentation

## 2016-05-06 DIAGNOSIS — F410 Panic disorder [episodic paroxysmal anxiety] without agoraphobia: Secondary | ICD-10-CM | POA: Insufficient documentation

## 2016-05-06 NOTE — Patient Instructions (Signed)
Facet Blocks Patient Information  Description: The facets are joints in the spine between the vertebrae.  Like any joints in the body, facets can become irritated and painful.  Arthritis can also effect the facets.  By injecting steroids and local anesthetic in and around these joints, we can temporarily block the nerve supply to them.  Steroids act directly on irritated nerves and tissues to reduce selling and inflammation which often leads to decreased pain.  Facet blocks may be done anywhere along the spine from the neck to the low back depending upon the location of your pain.   After numbing the skin with local anesthetic (like Novocaine), a small needle is passed onto the facet joints under x-ray guidance.  You may experience a sensation of pressure while this is being done.  The entire block usually lasts about 15-25 minutes.   Conditions which may be treated by facet blocks:   Low back/buttock pain  Neck/shoulder pain  Certain types of headaches  Preparation for the injection:  1. Do not eat any solid food or dairy products within 8 hours of your appointment. 2. You may drink clear liquid up to 3 hours before appointment.  Clear liquids include water, black coffee, juice or soda.  No milk or cream please. 3. You may take your regular medication, including pain medications, with a sip of water before your appointment.  Diabetics should hold regular insulin (if taken separately) and take 1/2 normal NPH dose the morning of the procedure.  Carry some sugar containing items with you to your appointment. 4. A driver must accompany you and be prepared to drive you home after your procedure. 5. Bring all your current medications with you. 6. An IV may be inserted and sedation may be given at the discretion of the physician. 7. A blood pressure cuff, EKG and other monitors will often be applied during the procedure.  Some patients may need to have extra oxygen administered for a short  period. 8. You will be asked to provide medical information, including your allergies and medications, prior to the procedure.  We must know immediately if you are taking blood thinners (like Coumadin/Warfarin) or if you are allergic to IV iodine contrast (dye).  We must know if you could possible be pregnant.  Possible side-effects:   Bleeding from needle site  Infection (rare, may require surgery)  Nerve injury (rare)  Numbness & tingling (temporary)  Difficulty urinating (rare, temporary)  Spinal headache (a headache worse with upright posture)  Light-headedness (temporary)  Pain at injection site (serveral days)  Decreased blood pressure (rare, temporary)  Weakness in arm/leg (temporary)  Pressure sensation in back/neck (temporary)   Call if you experience:   Fever/chills associated with headache or increased back/neck pain  Headache worsened by an upright position  New onset, weakness or numbness of an extremity below the injection site  Hives or difficulty breathing (go to the emergency room)  Inflammation or drainage at the injection site(s)  Severe back/neck pain greater than usual  New symptoms which are concerning to you  Please note:  Although the local anesthetic injected can often make your back or neck feel good for several hours after the injection, the pain will likely return. It takes 3-7 days for steroids to work.  You may not notice any pain relief for at least one week.  If effective, we will often do a series of 2-3 injections spaced 3-6 weeks apart to maximally decrease your pain.  After the initial   series, you may be a candidate for a more permanent nerve block of the facets.  If you have any questions, please [phone] [facility] 

## 2016-05-06 NOTE — Progress Notes (Signed)
Patient's Name: Cheryl [name]  Patient type: Established  MRN: [id number]  Service setting: Ambulatory outpatient  DOB: 09/12/1951  Location: [facility]  DOS: 05/06/2016  Primary Care Physician: Mila Merryonald Fisher, MD  Note by: Sydnee LevansFrancisco A. Laban EmperorNaveira, M.D, DABA, DABAPM, DABPM, DABIPP, FIPP  Referring Physician: Malva LimesFisher, Donald E, MD  Specialty: Board-Certified Interventional Pain Management  Last Visit to Pain Management: 04/24/2016   Primary Reason(s) for Visit: Encounter for prescription drug management & post-procedure evaluation of chronic illness with mild to moderate exacerbation(Level of risk: moderate) CC: Back Pain   HPI  [name] is a 65 y.o. year old, female patient, who returns today as an established patient. She has Essential hypertension; GERD (gastroesophageal reflux disease); Hyperlipemia; Anxiety; Airway hyperreactivity; Back pain, thoracic; Coronary artery disease; Excessive falling; Alteration in bowel elimination: incontinence; Insomnia; Decreased testosterone level; Leg weakness; Menopausal symptom; Migraine; Neuropathy (HCC); Fecal occult blood test positive; OP (osteoporosis); Panic disorder; Compulsive tobacco user syndrome; Urinary incontinence; Vitamin D deficiency; Weight loss; COPD (chronic obstructive pulmonary disease) (HCC); Dementia; Lumbar radicular pain; Chronic low back pain; Encounter for therapeutic drug level monitoring; Uncomplicated opioid dependence (HCC); Chronic pain syndrome; Hypercholesteremia; Peripheral nerve disease (HCC); Peripheral vascular disease (HCC); Platelet inhibition due to Plavix; Abnormal mammogram of right breast; Dilated intrahepatic bile duct; Dysphagia; Acute diastolic CHF (congestive heart failure) (HCC); Long term current use of opiate analgesic; Chronic pain; Fibromyalgia; Osteoarthrosis; Long term current use of anticoagulant therapy (Plavix); Long term prescription opiate use; Opiate use (160 MME/Day); Anemia;  Lumbar facet syndrome (Location of Primary Source of Pain) (Bilateral); and Lumbar spondylosis on her problem list.. Her primarily concern today is the Back Pain   Pain Assessment: Self-Reported Pain Score: 3  Reported level is compatible with observation Pain Type: Chronic pain Pain Location: Back Pain Orientation: Lower Pain Descriptors / Indicators: Constant, Sharp Pain Frequency: Constant  The patient comes into the clinics today for post-procedure evaluation on the interventional treatment done on 04/23/2016. In addition, she comes in today for pharmacological management of her chronic pain.  The patient  reports that she does not use illicit drugs.  Date of Last Visit: 04/23/16 Service Provided on Last Visit: Procedure (bilateral lumbar facet)  Controlled Substance Pharmacotherapy Assessment & REMS (Risk Evaluation and Mitigation Strategy)  Analgesic: Duragesic 50 mcg/h every 72 2 hours. Pill Count: Duragesic patch 3/10; filled 04/22/16. Tramadol 92/240; filled 04/23/16 MME/day: 160 mg/day.  Pharmacokinetics: Onset of action (Liberation/Absorption): Within expected pharmacological parameters Time to Peak effect (Distribution): Timing and results are as within normal expected parameters Duration of action (Metabolism/Excretion): Within normal limits for medication Pharmacodynamics: Analgesic Effect: More than 50% Activity Facilitation: Medication(s) allow patient to sit, stand, walk, and do the basic ADLs Perceived Effectiveness: Described as relatively effective, allowing for increase in activities of daily living (ADL) Side-effects or Adverse reactions: None reported Monitoring: [place] PMP: Online review of the past 5915-month period conducted. Compliant with practice rules and regulations Last UDS on record: TOXASSURE SELECT 13  Date Value Ref Range Status  03/20/2016 FINAL  Final    Comment:    ==================================================================== TOXASSURE  SELECT 13 (MW) ==================================================================== Test                             Result       Flag       Units Drug Present and Declared for Prescription Verification   Fentanyl  57           EXPECTED   ng/mg creat   Norfentanyl                    >485         EXPECTED   ng/mg creat    Source of fentanyl is a scheduled prescription medication,    including IV, patch, and transmucosal formulations. Norfentanyl    is an expected metabolite of fentanyl.   Tramadol                       PRESENT      EXPECTED   O-Desmethyltramadol            PRESENT      EXPECTED   N-Desmethyltramadol            PRESENT      EXPECTED    Source of tramadol is a prescription medication.    O-desmethyltramadol and N-desmethyltramadol are expected    metabolites of tramadol. ==================================================================== Test                      Result    Flag   Units      Ref Range   Creatinine              206              mg/dL      >=16 ==================================================================== Declared Medications:  The flagging and interpretation on this report are based on the  following declared medications.  Unexpected results may arise from  inaccuracies in the declared medications.  **Note: The testing scope of this panel includes these medications:  Fentanyl (Duragesic)  Tramadol (Ultram)  **Note: The testing scope of this panel does not include following  reported medications:  Atropine (Lomotil)  Diphenoxylate (Lomotil)  Donepezil (Aricept)  Estropipate (Ogen)  Fluticasone (Advair)  Furosemide (Lasix)  Pantoprazole (Protonix)  Pregabalin (Lyrica)  Salmeterol (Advair)  Simvastatin (Zocor)  Sumatriptan (Imitrex)  Tiotropium (Spiriva) ==================================================================== For clinical consultation, please call [phone]. ====================================================================    UDS interpretation: Compliant Medication Assessment Form: Reviewed. Patient indicates being compliant with therapy Treatment compliance: Compliant Risk Assessment: Aberrant Behavior: None observed today Substance Use Disorder (SUD) Risk Level: Low Risk of opioid abuse or dependence: 0.7-3.0% with doses ? 36 MME/day and 6.1-26% with doses ? 120 MME/day. Opioid Risk Tool (ORT) Score: Total Score: 3 Low Risk for SUD (Score <3) Depression Scale Score: PHQ-2: PHQ-2 Total Score: 0 No depression (0) PHQ-9: PHQ-9 Total Score: 0 No depression (0-4)  Pharmacologic Plan: No change in therapy, at this time  Post-Procedure Assessment  Procedure done on last visit: Diagnostic bilateral lumbar facet block under fluoroscopic guidance and IV sedation. Side-effects or Adverse reactions: None reported Sedation: Sedation given  Results: Ultra-Short Term Relief (First 1 hour after procedure): 100 %  Analgesia during this period is likely to be Local Anesthetic and/or IV Sedative (Analgesic/Anxiolitic) related Short Term Relief (Initial 4-6 hrs after procedure): 100 % Complete relief confirms area to be the source of pain Long Term Relief : 50 % Long-term benefit would suggest an inflammatory etiology to the pain   Current Relief (Now): 50%  Persistent relief would suggest effective anti-inflammatory effects from steroids Interpretation of Results: The results of this procedure would suggest patient to be a good candidate for radiofrequency ablation of the lumbar facet joints.  Laboratory Chemistry  Inflammation Markers Lab Results  Component Value Date   ESRSEDRATE 52* 03/01/2016   CRP 0.6 03/01/2016    Renal Function Lab Results  Component Value Date   BUN 12 03/01/2016   CREATININE 0.71 03/01/2016   GFRAA >60 03/01/2016   GFRNONAA >60 03/01/2016    Hepatic Function Lab Results  Component Value Date    AST 21 03/01/2016   ALT 12* 03/01/2016   ALBUMIN 3.9 03/01/2016    Electrolytes Lab Results  Component Value Date   NA 140 03/01/2016   K 3.8 03/01/2016   CL 107 03/01/2016   CALCIUM 9.1 03/01/2016   MG 2.2 03/01/2016    Pain Modulating Vitamins Lab Results  Component Value Date   VD25OH 25.6* 01/17/2016   VITAMINB12 242 03/01/2016    Coagulation Parameters Lab Results  Component Value Date   INR 1.08 10/17/2015   LABPROT 14.2 10/17/2015   APTT 38* 10/17/2015   PLT 296 04/09/2016    Note: Labs Reviewed.  Recent Diagnostic Imaging  Dg Finger Thumb Left  04/09/2016  CLINICAL DATA:  Pain and swelling of the left thumb, no injury EXAM: LEFT THUMB 2+V COMPARISON:  None. FINDINGS: There primarily changes of osteoarthritis present involving the left first [facility] joint oval as well as the left first MTP and DIP joint. However there is suspicion of erosion involving the distal aspect of the first metacarpal, and possibly the base of the first metacarpal as well. Therefore, arthritis such as gout cannot be excluded. No fracture is seen. IMPRESSION: 1. Primarily changes of osteoarthritis of the left thumb. 2. However cannot exclude gout as noted above. Electronically Signed   By: Dwyane Dee M.D.   On: 04/09/2016 16:05    Meds  The patient has a current medication list which includes the following prescription(s): acetaminophen, albuterol, albuterol, alprazolam, clopidogrel, cyclobenzaprine, diphenoxylate-atropine, donepezil, estropipate, fentanyl, fentanyl, fluticasone, fluticasone-salmeterol, furosemide, pantoprazole, pregabalin, simvastatin, sumatriptan, tiotropium, and tramadol, and the following Facility-Administered Medications: midazolam.  Current Outpatient Prescriptions on File Prior to Visit  Medication Sig  . acetaminophen (TYLENOL) 500 MG tablet Take 1,000 mg by mouth every 6 (six) hours as needed for mild pain or headache.   . albuterol (PROVENTIL HFA;VENTOLIN HFA) 108 (90  BASE) MCG/ACT inhaler Inhale 2 puffs into the lungs every 4 (four) hours as needed for wheezing or shortness of breath.  Marland Kitchen albuterol (PROVENTIL) (2.5 MG/3ML) 0.083% nebulizer solution Take 3 mLs (2.5 mg total) by nebulization every 4 (four) hours as needed for wheezing.  Marland Kitchen ALPRAZolam (XANAX) 0.5 MG tablet take 1 tablet by mouth every 6 hours if needed  . clopidogrel (PLAVIX) 75 MG tablet Take 75 mg by mouth daily.  . cyclobenzaprine (FLEXERIL) 5 MG tablet take 1-2 tablets by mouth every 8 hours if needed  . diphenoxylate-atropine (LOMOTIL) 2.5-0.025 MG per tablet Take 2 tablets by mouth 4 (four) times daily as needed for diarrhea or loose stools. Reported on 03/11/2016  . donepezil (ARICEPT) 10 MG tablet Take 10 mg by mouth at bedtime.  Marland Kitchen estropipate (OGEN) 0.75 MG tablet Take 1 tablet (0.75 mg total) by mouth daily.  . fentaNYL (DURAGESIC - DOSED MCG/HR) 50 MCG/HR Place 1 patch (50 mcg total) onto the skin every 3 (three) days.  . fentaNYL (DURAGESIC - DOSED MCG/HR) 50 MCG/HR Place 1 patch (50 mcg total) onto the skin every 3 (three) days.  . fluticasone (FLONASE) 50 MCG/ACT nasal spray Place 2 sprays into both nostrils daily.  . Fluticasone-Salmeterol (ADVAIR) 250-50 MCG/DOSE AEPB Inhale 1 puff into the lungs 2 (  two) times daily.  . furosemide (LASIX) 40 MG tablet Take 40 mg by mouth daily.  . pantoprazole (PROTONIX) 40 MG tablet Take 40 mg by mouth daily.   . pregabalin (LYRICA) 150 MG capsule Take 1 capsule (150 mg total) by mouth every 8 (eight) hours.  . simvastatin (ZOCOR) 10 MG tablet Take 10 mg by mouth at bedtime. Reported on 12/04/2015  . SUMAtriptan (IMITREX) 25 MG tablet Take 25 mg by mouth as needed for migraine. May repeat in 2 hours if headache persists or recurs.  Marland Kitchen tiotropium (SPIRIVA) 18 MCG inhalation capsule Place 1 capsule (18 mcg total) into inhaler and inhale daily.  . traMADol (ULTRAM) 50 MG tablet Take 2 tablets (100 mg total) by mouth every 6 (six) hours as needed for severe  pain.   Current Facility-Administered Medications on File Prior to Visit  Medication  . midazolam (VERSED) 5 MG/5ML injection 1-2 mg    ROS  Constitutional: Denies any fever or chills Gastrointestinal: No reported hemesis, hematochezia, vomiting, or acute GI distress Musculoskeletal: Denies any acute onset joint swelling, redness, loss of ROM, or weakness Neurological: No reported episodes of acute onset apraxia, aphasia, dysarthria, agnosia, amnesia, paralysis, loss of coordination, or loss of consciousness  Allergies  Ms. Hennen is allergic to percocet; aspirin; codeine; morphine; propoxyphene; and sulfa antibiotics.  PFSH  Medical:  Ms. Kuhnle  has a past medical history of COPD (chronic obstructive pulmonary disease) (HCC); Hypertension; Arthritis; Hyperlipidemia; GERD (gastroesophageal reflux disease); Neuropathy (HCC) (2010); Oxygen deficiency; Coronary artery disease; Headache; Anxiety; Peripheral vascular disease (HCC); Vitamin D deficiency; Migraines; Chronic pain; DVT (deep venous thrombosis) (HCC); Pneumonia; Osteoporosis; Allergy; Asthma; Aspiration pneumonitis (HCC) (11/24/2015); and Pneumonia (11/19/2015). Family: family history includes Arthritis in her mother; Cancer in her brother, brother, and mother; Diabetes in her brother, mother, and sister; Heart disease in her father and mother. Surgical:  has past surgical history that includes Appendectomy; Spine surgery; Foot surgery (Bilateral); cardiac catherization (10/31/2009); abdomnal aortic stent (05/30/2008); Abdominal hysterectomy (1975); Cholecystectomy (1972); Cervical fusion (C5 - 6/C6-7); Appendectomy; Colonoscopy with propofol (N/A, 07/27/2015); and Esophagogastroduodenoscopy (egd) with propofol (N/A, 07/27/2015). Tobacco:  reports that she has been smoking Cigarettes.  She has a 25 pack-year smoking history. She does not have any smokeless tobacco history on file. Alcohol:  reports that she does not drink alcohol. Drug:   reports that she does not use illicit drugs.  Constitutional Exam  Vitals: Blood pressure 131/70, pulse 64, temperature 98.2 F (36.8 C), temperature source Oral, resp. rate 16, height 5\' 1"  (1.549 m), weight 104 lb (47.174 kg), SpO2 99 %. General appearance: Well nourished, well developed, and well hydrated. In no acute distress Calculated BMI/Body habitus: Body mass index is 19.66 kg/(m^2). (18.5-24.9 kg/m2) Ideal body weight Psych/Mental status: Alert and oriented x 3 (person, place, & time) Eyes: PERLA Respiratory: No evidence of acute respiratory distress  Cervical Spine Exam  Inspection: No masses, redness, or swelling Alignment: Symmetrical ROM: Functional: ROM is within functional limits [facility]) Stability: No instability detected Muscle strength & Tone: Functionally intact Sensory: Unimpaired Palpation: No complaints of tenderness  Upper Extremity (UE) Exam    Side: Right upper extremity  Side: Left upper extremity  Inspection: No masses, redness, swelling, or asymmetry  Inspection: No masses, redness, swelling, or asymmetry  ROM:  ROM:  Functional: ROM is within functional limits [facility])  Functional: ROM is within functional limits [facility])  Muscle strength & Tone: Functionally intact  Muscle strength & Tone: Functionally intact  Sensory: Unimpaired  Sensory: Unimpaired  Palpation: Non-contributory  Palpation: Non-contributory   Thoracic Spine Exam  Inspection: Significant thoracic kyphosis Alignment: Symmetrical ROM: Functional: ROM is within functional limits [facility]) Stability: No instability detected Sensory: Unimpaired Muscle strength & Tone: Functionally intact Palpation: No complaints of tenderness  Lumbar Spine Exam  Inspection: No masses, redness, or swelling Alignment: Symmetrical ROM: Functional: Decreased ROM Stability: No instability detected Muscle strength & Tone: Functionally intact Sensory: Unimpaired Palpation: Tender Provocative Tests: Lumbar  Hyperextension and rotation test: Positive for bilateral lumbar facet joint pain. Patrick's Maneuver: deferred  Gait & Posture Assessment  Ambulation: Patient ambulates using a cane Gait: Antalgic Posture: WNL  Lower Extremity Exam    Side: Right lower extremity  Side: Left lower extremity  Inspection: No masses, redness, swelling, or asymmetry ROM:  Inspection: No masses, redness, swelling, or asymmetry ROM:  Functional: ROM is within functional limits [facility])  Functional: ROM is within functional limits [facility])  Muscle strength & Tone: Functionally intact  Muscle strength & Tone: Functionally intact  Sensory: Unimpaired  Sensory: Unimpaired  Palpation: Non-contributory  Palpation: Non-contributory   Assessment & Plan  Primary Diagnosis & Pertinent Problem List: The primary encounter diagnosis was Lumbar facet syndrome (Location of Primary Source of Pain) (Bilateral). A diagnosis of Chronic low back pain was also pertinent to this visit.  Visit Diagnosis: 1. Lumbar facet syndrome (Location of Primary Source of Pain) (Bilateral)   2. Chronic low back pain     Problems updated and reviewed during this visit: No problems updated.  Problem-specific Plan(s): No problem-specific assessment & plan notes found for this encounter.  No new assessment & plan notes have been filed under this hospital service since the last note was generated. Service: Pain Management   Plan of Care   Problem List Items Addressed This Visit      High   Chronic low back pain (Chronic)   Relevant Orders   LUMBAR FACET(MEDIAL BRANCH NERVE BLOCK) MBNB   Lumbar facet syndrome (Location of Primary Source of Pain) (Bilateral) - Primary (Chronic)   Relevant Orders   LUMBAR FACET(MEDIAL BRANCH NERVE BLOCK) MBNB       Pharmacotherapy (Medications Ordered): No orders of the defined types were placed in this encounter.    Lab-work & Procedure Ordered: Orders Placed This Encounter  Procedures  . LUMBAR  FACET(MEDIAL BRANCH NERVE BLOCK) MBNB    Imaging Ordered: None  Interventional Therapies: Scheduled:  None at this time.    Considering:  Bilateral lumbar facet radiofrequency neurotomy under fluoroscopic guidance and IV sedation.    PRN Procedures:  Bilateral lumbar facet block under fluoroscopic guidance and IV sedation #2.    Referral(s) or Consult(s): None at this time.  New Prescriptions   No medications on file    Medications administered during this visit: Ms. Deveney had no medications administered during this visit.  Requested PM Follow-up: Return for Keep previously scheduled appointment., Procedure (PRN - Patient will call).  Future Appointments Date Time Provider Department Center  07/03/2016 8:40 AM Delano Metz, MD [facility] None    Primary Care Physician: Mila Merry, MD Location: [facility] Note by: Sydnee Levans. Laban Emperor, M.D, DABA, DABAPM, DABPM, DABIPP, FIPP  Pain Score Disclaimer: We use the NRS-11 scale. This is a self-reported, subjective measurement of pain severity with only modest accuracy. It is used primarily to identify changes within a particular patient. It must be understood that outpatient pain scales are significantly less accurate that those used for research, where they can  be applied under ideal controlled circumstances with minimal exposure to variables. In reality, the score is likely to be a combination of pain intensity and pain affect, where pain affect describes the degree of emotional arousal or changes in action readiness caused by the sensory experience of pain. Factors such as social and work situation, setting, emotional state, anxiety levels, expectation, and prior pain experience may influence pain perception and show large inter-individual differences that may also be affected by time variables.  Patient instructions provided at this appointment:: Patient Instructions  Facet Blocks Patient  Information  Description: The facets are joints in the spine between the vertebrae.  Like any joints in the body, facets can become irritated and painful.  Arthritis can also effect the facets.  By injecting steroids and local anesthetic in and around these joints, we can temporarily block the nerve supply to them.  Steroids act directly on irritated nerves and tissues to reduce selling and inflammation which often leads to decreased pain.  Facet blocks may be done anywhere along the spine from the neck to the low back depending upon the location of your pain.   After numbing the skin with local anesthetic (like Novocaine), a small needle is passed onto the facet joints under x-ray guidance.  You may experience a sensation of pressure while this is being done.  The entire block usually lasts about 15-25 minutes.   Conditions which may be treated by facet blocks:   Low back/buttock pain  Neck/shoulder pain  Certain types of headaches  Preparation for the injection:  1. Do not eat any solid food or dairy products within 8 hours of your appointment. 2. You may drink clear liquid up to 3 hours before appointment.  Clear liquids include water, black coffee, juice or soda.  No milk or cream please. 3. You may take your regular medication, including pain medications, with a sip of water before your appointment.  Diabetics should hold regular insulin (if taken separately) and take 1/2 normal NPH dose the morning of the procedure.  Carry some sugar containing items with you to your appointment. 4. A driver must accompany you and be prepared to drive you home after your procedure. 5. Bring all your current medications with you. 6. An IV may be inserted and sedation may be given at the discretion of the physician. 7. A blood pressure cuff, EKG and other monitors will often be applied during the procedure.  Some patients may need to have extra oxygen administered for a short period. 8. You will be asked  to provide medical information, including your allergies and medications, prior to the procedure.  We must know immediately if you are taking blood thinners (like Coumadin/Warfarin) or if you are allergic to IV iodine contrast (dye).  We must know if you could possible be pregnant.  Possible side-effects:   Bleeding from needle site  Infection (rare, may require surgery)  Nerve injury (rare)  Numbness & tingling (temporary)  Difficulty urinating (rare, temporary)  Spinal headache (a headache worse with upright posture)  Light-headedness (temporary)  Pain at injection site (serveral days)  Decreased blood pressure (rare, temporary)  Weakness in arm/leg (temporary)  Pressure sensation in back/neck (temporary)   Call if you experience:   Fever/chills associated with headache or increased back/neck pain  Headache worsened by an upright position  New onset, weakness or numbness of an extremity below the injection site  Hives or difficulty breathing (go to the emergency room)  Inflammation or drainage at  the injection site(s)  Severe back/neck pain greater than usual  New symptoms which are concerning to you  Please note:  Although the local anesthetic injected can often make your back or neck feel good for several hours after the injection, the pain will likely return. It takes 3-7 days for steroids to work.  You may not notice any pain relief for at least one week.  If effective, we will often do a series of 2-3 injections spaced 3-6 weeks apart to maximally decrease your pain.  After the initial series, you may be a candidate for a more permanent nerve block of the facets.  If you have any questions, please call [phone] [facility]

## 2016-05-06 NOTE — Progress Notes (Signed)
Pill count: Duragesic patch 3/10; filled 04/22/16 Tramadol 92/240; filled 04/23/16

## 2016-05-11 DIAGNOSIS — M6281 Muscle weakness (generalized): Secondary | ICD-10-CM | POA: Diagnosis not present

## 2016-05-11 DIAGNOSIS — J449 Chronic obstructive pulmonary disease, unspecified: Secondary | ICD-10-CM | POA: Diagnosis not present

## 2016-05-16 DIAGNOSIS — N390 Urinary tract infection, site not specified: Secondary | ICD-10-CM | POA: Diagnosis not present

## 2016-05-16 DIAGNOSIS — R110 Nausea: Secondary | ICD-10-CM | POA: Diagnosis not present

## 2016-05-16 DIAGNOSIS — J449 Chronic obstructive pulmonary disease, unspecified: Secondary | ICD-10-CM | POA: Diagnosis not present

## 2016-05-16 DIAGNOSIS — G609 Hereditary and idiopathic neuropathy, unspecified: Secondary | ICD-10-CM | POA: Diagnosis not present

## 2016-05-21 ENCOUNTER — Other Ambulatory Visit: Payer: Self-pay | Admitting: *Deleted

## 2016-05-21 MED ORDER — ESTROPIPATE 0.75 MG PO TABS: 0.7500 mg | ORAL_TABLET | Freq: Every day | ORAL | Status: DC

## 2016-05-21 NOTE — Telephone Encounter (Signed)
Refill request for estropipate 0.75 mg qd   Last filled by MD on- 10/23/2015 x6 refills Last Appt: 04/09/2016 Next Appt: none Please advise refill?

## 2016-06-02 ENCOUNTER — Inpatient Hospital Stay
Admission: EM | Admit: 2016-06-02 | Discharge: 2016-06-05 | DRG: 190 | Disposition: A | Discharge status: Home / Self Care | Payer: Commercial Managed Care - HMO | Attending: Internal Medicine | Admitting: Internal Medicine

## 2016-06-02 ENCOUNTER — Encounter: Payer: Self-pay | Admitting: *Deleted

## 2016-06-02 ENCOUNTER — Emergency Department: Payer: Commercial Managed Care - HMO

## 2016-06-02 DIAGNOSIS — Z9181 History of falling: Secondary | ICD-10-CM

## 2016-06-02 DIAGNOSIS — G8929 Other chronic pain: Secondary | ICD-10-CM | POA: Diagnosis present

## 2016-06-02 DIAGNOSIS — J9611 Chronic respiratory failure with hypoxia: Secondary | ICD-10-CM | POA: Diagnosis not present

## 2016-06-02 DIAGNOSIS — J440 Chronic obstructive pulmonary disease with acute lower respiratory infection: Principal | ICD-10-CM | POA: Diagnosis present

## 2016-06-02 DIAGNOSIS — R509 Fever, unspecified: Secondary | ICD-10-CM

## 2016-06-02 DIAGNOSIS — I739 Peripheral vascular disease, unspecified: Secondary | ICD-10-CM | POA: Diagnosis present

## 2016-06-02 DIAGNOSIS — E876 Hypokalemia: Secondary | ICD-10-CM | POA: Diagnosis present

## 2016-06-02 DIAGNOSIS — F0390 Unspecified dementia without behavioral disturbance: Secondary | ICD-10-CM | POA: Diagnosis not present

## 2016-06-02 DIAGNOSIS — J441 Chronic obstructive pulmonary disease with (acute) exacerbation: Secondary | ICD-10-CM

## 2016-06-02 DIAGNOSIS — K219 Gastro-esophageal reflux disease without esophagitis: Secondary | ICD-10-CM | POA: Diagnosis not present

## 2016-06-02 DIAGNOSIS — R0902 Hypoxemia: Secondary | ICD-10-CM

## 2016-06-02 DIAGNOSIS — M810 Age-related osteoporosis without current pathological fracture: Secondary | ICD-10-CM | POA: Diagnosis present

## 2016-06-02 DIAGNOSIS — Z8261 Family history of arthritis: Secondary | ICD-10-CM | POA: Diagnosis not present

## 2016-06-02 DIAGNOSIS — F17210 Nicotine dependence, cigarettes, uncomplicated: Secondary | ICD-10-CM | POA: Diagnosis present

## 2016-06-02 DIAGNOSIS — Z86718 Personal history of other venous thrombosis and embolism: Secondary | ICD-10-CM

## 2016-06-02 DIAGNOSIS — J438 Other emphysema: Secondary | ICD-10-CM

## 2016-06-02 DIAGNOSIS — R109 Unspecified abdominal pain: Secondary | ICD-10-CM | POA: Diagnosis not present

## 2016-06-02 DIAGNOSIS — Z888 Allergy status to other drugs, medicaments and biological substances status: Secondary | ICD-10-CM | POA: Diagnosis not present

## 2016-06-02 DIAGNOSIS — R112 Nausea with vomiting, unspecified: Secondary | ICD-10-CM | POA: Diagnosis not present

## 2016-06-02 DIAGNOSIS — Z7951 Long term (current) use of inhaled steroids: Secondary | ICD-10-CM

## 2016-06-02 DIAGNOSIS — Z882 Allergy status to sulfonamides status: Secondary | ICD-10-CM

## 2016-06-02 DIAGNOSIS — Z8701 Personal history of pneumonia (recurrent): Secondary | ICD-10-CM

## 2016-06-02 DIAGNOSIS — Z9889 Other specified postprocedural states: Secondary | ICD-10-CM | POA: Diagnosis not present

## 2016-06-02 DIAGNOSIS — R4182 Altered mental status, unspecified: Secondary | ICD-10-CM | POA: Diagnosis not present

## 2016-06-02 DIAGNOSIS — Z8249 Family history of ischemic heart disease and other diseases of the circulatory system: Secondary | ICD-10-CM | POA: Diagnosis not present

## 2016-06-02 DIAGNOSIS — R1310 Dysphagia, unspecified: Secondary | ICD-10-CM | POA: Diagnosis not present

## 2016-06-02 DIAGNOSIS — J449 Chronic obstructive pulmonary disease, unspecified: Secondary | ICD-10-CM | POA: Diagnosis not present

## 2016-06-02 DIAGNOSIS — Z90710 Acquired absence of both cervix and uterus: Secondary | ICD-10-CM | POA: Diagnosis not present

## 2016-06-02 DIAGNOSIS — J9610 Chronic respiratory failure, unspecified whether with hypoxia or hypercapnia: Secondary | ICD-10-CM | POA: Diagnosis not present

## 2016-06-02 DIAGNOSIS — J189 Pneumonia, unspecified organism: Secondary | ICD-10-CM | POA: Diagnosis not present

## 2016-06-02 DIAGNOSIS — Z79899 Other long term (current) drug therapy: Secondary | ICD-10-CM | POA: Diagnosis not present

## 2016-06-02 DIAGNOSIS — I2510 Atherosclerotic heart disease of native coronary artery without angina pectoris: Secondary | ICD-10-CM | POA: Diagnosis present

## 2016-06-02 DIAGNOSIS — Z833 Family history of diabetes mellitus: Secondary | ICD-10-CM | POA: Diagnosis not present

## 2016-06-02 DIAGNOSIS — Z9049 Acquired absence of other specified parts of digestive tract: Secondary | ICD-10-CM

## 2016-06-02 DIAGNOSIS — I110 Hypertensive heart disease with heart failure: Secondary | ICD-10-CM | POA: Diagnosis present

## 2016-06-02 DIAGNOSIS — E162 Hypoglycemia, unspecified: Secondary | ICD-10-CM | POA: Diagnosis not present

## 2016-06-02 DIAGNOSIS — Z809 Family history of malignant neoplasm, unspecified: Secondary | ICD-10-CM | POA: Diagnosis not present

## 2016-06-02 DIAGNOSIS — K567 Ileus, unspecified: Secondary | ICD-10-CM | POA: Diagnosis not present

## 2016-06-02 DIAGNOSIS — I509 Heart failure, unspecified: Secondary | ICD-10-CM | POA: Diagnosis present

## 2016-06-02 DIAGNOSIS — R1312 Dysphagia, oropharyngeal phase: Secondary | ICD-10-CM | POA: Diagnosis not present

## 2016-06-02 DIAGNOSIS — M1990 Unspecified osteoarthritis, unspecified site: Secondary | ICD-10-CM | POA: Diagnosis present

## 2016-06-02 DIAGNOSIS — R1110 Vomiting, unspecified: Secondary | ICD-10-CM

## 2016-06-02 DIAGNOSIS — R2681 Unsteadiness on feet: Secondary | ICD-10-CM | POA: Diagnosis not present

## 2016-06-02 DIAGNOSIS — Z720 Tobacco use: Secondary | ICD-10-CM | POA: Diagnosis not present

## 2016-06-02 LAB — COMPREHENSIVE METABOLIC PANEL
ALBUMIN: 3.3 g/dL — AB (ref 3.5–5.0)
ALT: 14 U/L (ref 14–54)
AST: 28 U/L (ref 15–41)
Alkaline Phosphatase: 121 U/L (ref 38–126)
Anion gap: 14 (ref 5–15)
BILIRUBIN TOTAL: 1.2 mg/dL (ref 0.3–1.2)
BUN: 14 mg/dL (ref 6–20)
CO2: 23 mmol/L (ref 22–32)
CREATININE: 0.79 mg/dL (ref 0.44–1.00)
Calcium: 8.4 mg/dL — ABNORMAL LOW (ref 8.9–10.3)
Chloride: 98 mmol/L — ABNORMAL LOW (ref 101–111)
GFR calc Af Amer: >60 mL/min (ref >60)
GFR calc non Af Amer: >60 mL/min (ref >60)
GLUCOSE: 52 mg/dL — AB (ref 65–99)
POTASSIUM: 3.6 mmol/L (ref 3.5–5.1)
Sodium: 135 mmol/L (ref 135–145)
TOTAL PROTEIN: 6.7 g/dL (ref 6.5–8.1)

## 2016-06-02 LAB — CBC WITH DIFFERENTIAL/PLATELET
Basophils Absolute: 0 10*3/uL (ref 0–0.1)
Basophils Relative: 0 %
EOS ABS: 0 10*3/uL (ref 0–0.7)
Eosinophils Relative: 0 %
HEMATOCRIT: 35.7 % (ref 35.0–47.0)
HEMOGLOBIN: 11.8 g/dL — AB (ref 12.0–16.0)
LYMPHS ABS: 0.8 10*3/uL — AB (ref 1.0–3.6)
Lymphocytes Relative: 7 %
MCH: 29.4 pg (ref 26.0–34.0)
MCHC: 32.9 g/dL (ref 32.0–36.0)
MCV: 89.3 fL (ref 80.0–100.0)
MONO ABS: 0.9 10*3/uL (ref 0.2–0.9)
MONOS PCT: 8 %
NEUTROS ABS: 10.2 10*3/uL — AB (ref 1.4–6.5)
NEUTROS PCT: 85 %
Platelets: 210 10*3/uL (ref 150–440)
RBC: 4 MIL/uL (ref 3.80–5.20)
RDW: 18.8 % — ABNORMAL HIGH (ref 11.5–14.5)
WBC: 11.9 10*3/uL — ABNORMAL HIGH (ref 3.6–11.0)

## 2016-06-02 LAB — GLUCOSE, CAPILLARY: Glucose-Capillary: 191 mg/dL — ABNORMAL HIGH (ref 65–99)

## 2016-06-02 LAB — TROPONIN I: Troponin I: <0.03 ng/mL (ref <0.03)

## 2016-06-02 LAB — LACTIC ACID, PLASMA: Lactic Acid, Venous: 0.9 mmol/L (ref 0.5–1.9)

## 2016-06-02 LAB — HCG, QUANTITATIVE, PREGNANCY: hCG, Beta Chain, Quant, S: 1 m[IU]/mL (ref <5)

## 2016-06-02 MED ORDER — PIPERACILLIN-TAZOBACTAM 3.375 G IVPB 30 MIN
3.3750 g | Freq: Once | INTRAVENOUS | Status: AC
  Administered 2016-06-02: 3.375 g via INTRAVENOUS
  Filled 2016-06-02: qty 50

## 2016-06-02 MED ORDER — VANCOMYCIN HCL IN DEXTROSE 1-5 GM/200ML-% IV SOLN
1000.0000 mg | Freq: Once | INTRAVENOUS | Status: AC
  Administered 2016-06-02: 1000 mg via INTRAVENOUS
  Filled 2016-06-02: qty 200

## 2016-06-02 MED ORDER — DEXTROSE 50 % IV SOLN
25.0000 g | Freq: Once | INTRAVENOUS | Status: AC
  Administered 2016-06-02: 25 g via INTRAVENOUS
  Filled 2016-06-02: qty 50

## 2016-06-02 MED ORDER — SODIUM CHLORIDE 0.9 % IV BOLUS (SEPSIS)
500.0000 mL | Freq: Once | INTRAVENOUS | Status: AC
  Administered 2016-06-02: 500 mL via INTRAVENOUS

## 2016-06-02 MED ORDER — SODIUM CHLORIDE 0.9 % IV BOLUS (SEPSIS)
1000.0000 mL | Freq: Once | INTRAVENOUS | Status: AC
  Administered 2016-06-02: 1000 mL via INTRAVENOUS

## 2016-06-02 NOTE — ED Notes (Signed)
2 unsuccessful PIV attempts by this RN; Fleet Contrasachel, primary RN, made aware.

## 2016-06-02 NOTE — ED Provider Notes (Signed)
[facility] Emergency Department Provider Note   L5 caveat: Review of systems and history is limited by altered mental status     Time seen: ----------------------------------------- 9:53 PM on 06/02/2016 -----------------------------------------    I have reviewed the triage vital signs and the nursing notes.   HISTORY  Chief Complaint Code Sepsis    HPI Cheryl [name] is a 65 y.o. female who presents to the ER brought by her family for fever and altered mental status. Family states he checked her temperature today at home it was 102 and then came down to 101.Family member also reports poor by mouth intake with nausea. She's had a cough since yesterday. She does have a history of pneumonia. No further information is available, patient with altered mental status.   Past Medical History  Diagnosis Date  . COPD (chronic obstructive pulmonary disease) (HCC)   . Hypertension   . Arthritis   . Hyperlipidemia   . GERD (gastroesophageal reflux disease)   . Neuropathy (HCC) 2010  . Oxygen deficiency   . Coronary artery disease   . Headache   . Anxiety   . Peripheral vascular disease (HCC)   . Vitamin D deficiency   . Migraines   . Chronic pain   . DVT (deep venous thrombosis) (HCC)   . Pneumonia   . Osteoporosis   . Allergy   . Asthma   . Aspiration pneumonitis (HCC) 11/24/2015  . Pneumonia 11/19/2015    Patient Active Problem List   Diagnosis Date Noted  . Lumbar facet syndrome (Location of Primary Source of Pain) (Bilateral) 03/20/2016  . Lumbar spondylosis 03/20/2016  . Long term prescription opiate use 02/28/2016  . Opiate use (160 MME/Day) 02/28/2016  . Anemia 02/28/2016  . Osteoarthrosis 12/18/2015  . Long term current use of anticoagulant therapy (Plavix) 12/18/2015  . Long term current use of opiate analgesic 12/04/2015  . Chronic pain 12/04/2015  . Fibromyalgia 12/04/2015  . Dysphagia 11/24/2015  . Acute diastolic CHF  (congestive heart failure) (HCC) 11/24/2015  . Abnormal mammogram of right breast 10/11/2015  . Dilated intrahepatic bile duct 10/11/2015  . Lumbar radicular pain 09/04/2015  . Chronic low back pain 09/04/2015  . Encounter for therapeutic drug level monitoring 09/04/2015  . Uncomplicated opioid dependence (HCC) 09/04/2015  . Chronic pain syndrome 09/04/2015  . Platelet inhibition due to Plavix 09/04/2015  . COPD (chronic obstructive pulmonary disease) (HCC) 07/31/2015  . Dementia 07/31/2015  . Airway hyperreactivity 07/03/2015  . Back pain, thoracic 07/03/2015  . Excessive falling 07/03/2015  . Alteration in bowel elimination: incontinence 07/03/2015  . Insomnia 07/03/2015  . Decreased testosterone level 07/03/2015  . Leg weakness 07/03/2015  . Menopausal symptom 07/03/2015  . Migraine 07/03/2015  . Neuropathy (HCC) 07/03/2015  . Fecal occult blood test positive 07/03/2015  . OP (osteoporosis) 07/03/2015  . Panic disorder 07/03/2015  . Compulsive tobacco user syndrome 07/03/2015  . Urinary incontinence 07/03/2015  . Weight loss 07/03/2015  . Essential hypertension 06/06/2015  . GERD (gastroesophageal reflux disease) 06/06/2015  . Hyperlipemia 06/06/2015  . 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 Anxiety 02/10/2014  . Coronary artery disease 02/10/2014  . Hypercholesteremia 02/10/2014  . Peripheral vascular disease (HCC) 02/10/2014  . Vitamin D deficiency 10/16/2009    Past Surgical History  Procedure Laterality Date  . Appendectomy    . Spine surgery    . Foot surgery Bilateral     5-6 years per patient  . Cardiac catherization  10/31/2009  . Abdomnal  aortic stent  05/30/2008    Dr. Nanetta Batty  . Abdominal hysterectomy  1975    Bilaterl Oophorectomy; Dur to IUD infection  . Cholecystectomy  1972  . Cervical fusion  C5 - 6/C6-7  . Appendectomy    . Colonoscopy with propofol N/A 07/27/2015    Procedure: COLONOSCOPY WITH PROPOFOL;  Surgeon: Wallace Cullens, MD;   Location: [facility] ENDOSCOPY;  Service: Gastroenterology;  Laterality: N/A;  . Esophagogastroduodenoscopy (egd) with propofol N/A 07/27/2015    Procedure: ESOPHAGOGASTRODUODENOSCOPY (EGD) WITH PROPOFOL;  Surgeon: Wallace Cullens, MD;  Location: [facility] ENDOSCOPY;  Service: Gastroenterology;  Laterality: N/A;    Allergies Percocet; Aspirin; Codeine; Morphine; Propoxyphene; and Sulfa antibiotics  Social History Social History  Substance Use Topics  . Smoking status: Current Every Day Smoker -- 0.50 packs/day for 50 years    Types: Cigarettes  . Smokeless tobacco: None  . Alcohol Use: No    Review of Systems Constitutional: Positive for fever Cardiovascular: Negative for chest pain. Respiratory: Positive for cough Gastrointestinal: Positive for nausea Neurological: Positive for weakness and altered mental status  Review of systems otherwise unknown at this time  ____________________________________________   PHYSICAL EXAM:  VITAL SIGNS: ED Triage Vitals  Enc Vitals Group     BP 06/02/16 2134 131/48 mmHg     Pulse Rate 06/02/16 2134 79     Resp 06/02/16 2134 24     Temp 06/02/16 2134 98.9 F (37.2 C)     Temp Source 06/02/16 2134 Oral     SpO2 06/02/16 2134 87 %     Weight 06/02/16 2134 100 lb (45.36 kg)     Height 06/02/16 2134  (1.549 m)     Head Cir --      Peak Flow --      Pain Score 06/02/16 2136 0     Pain Loc --      Pain Edu? --      Excl. in GC? --     Constitutional: Lethargic, disoriented, mild to moderate distress Eyes: Conjunctivae are normal. PERRL. Normal extraocular movements. ENT   Head: Normocephalic and atraumatic.   Nose: No congestion/rhinnorhea.   Mouth/Throat: Mucous membranes are moist.   Neck: No stridor. Cardiovascular: Normal rate, regular rhythm. No murmurs, rubs, or gallops. Respiratory: Tachypnea with coarse breath sounds and rales worse on the left. Gastrointestinal: Soft and nontender. Normal bowel sounds Musculoskeletal:  Unremarkable range of motion of the extremities Neurologic:  Confusion, weakness, no gross focal neurologic deficits are appreciated. Skin:  Skin is warm, dry and intact. No rash noted. ____________________________________________  EKG: Interpreted by me.Sinus rhythm with a rate of 74 bpm, normal PR interval, normal QRS, normal QT interval. Normal axis, diffuse ST changes suggestive of ischemia  ____________________________________________  ED COURSE:  Pertinent labs & imaging results that were available during my care of the patient were reviewed by me and considered in my medical decision making (see chart for details). Patient presents to ER with fever, altered mental status, cough. Oxygen saturations were 87%. Sepsis protocol will be utilized. ____________________________________________    LABS (pertinent positives/negatives)  Labs Reviewed  COMPREHENSIVE METABOLIC PANEL - Abnormal; Notable for the following:    Chloride 98 (*)    Glucose, Bld 52 (*)    Calcium 8.4 (*)    Albumin 3.3 (*)    All other components within normal limits  CBC WITH DIFFERENTIAL/PLATELET - Abnormal; Notable for the following:    WBC 11.9 (*)    Hemoglobin 11.8 (*)  RDW 18.8 (*)    Neutro Abs 10.2 (*)    Lymphs Abs 0.8 (*)    All other components within normal limits  CULTURE, BLOOD (ROUTINE X 2)  CULTURE, BLOOD (ROUTINE X 2)  URINE CULTURE  HCG, QUANTITATIVE, PREGNANCY  LACTIC ACID, PLASMA  TROPONIN I  LACTIC ACID, PLASMA  URINALYSIS COMPLETEWITH MICROSCOPIC ([facility] ONLY)  BLOOD GAS, VENOUS   CRITICAL CARE Performed by: Emily Filbert   Total critical care time: 30 minutes  Critical care time was exclusive of separately billable procedures and treating other patients.  Critical care was necessary to treat or prevent imminent or life-threatening deterioration.  Critical care was time spent personally by me on the following activities: development of treatment plan with patient  and/or surrogate as well as nursing, discussions with consultants, evaluation of patient's response to treatment, examination of patient, obtaining history from patient or surrogate, ordering and performing treatments and interventions, ordering and review of laboratory studies, ordering and review of radiographic studies, pulse oximetry and re-evaluation of patient's condition.  RADIOLOGY Images were viewed by me  Chest x-ray IMPRESSION: No active disease. ____________________________________________  FINAL ASSESSMENT AND PLAN  Systemic inflammatory response syndrome, suspected pneumonia, altered mental status  Plan: Patient with labs and imaging as dictated above. Patient presents clinically with fever, hypoxia, weakness. She did have transient hypoglycemia and was treated with IV D50. Currently she is improving, vital signs have remained stable. She has been placed on broad-spectrum antibiotic coverage to treat sepsis. I will discuss with the hospitalist for admission.   Emily Filbert, MD   Note: This dictation was prepared with Dragon dictation. Any transcriptional errors that result from this process are unintentional   Emily Filbert, MD 06/02/16 2330

## 2016-06-02 NOTE — ED Notes (Signed)
Husband reports pt started having fever last night. Checked temp today at home and was 102. Pt is poorly responsive to questions during triage. Husband reports pt w/ poor PO intake and c/o nausea. Pt denies dysuria. Pt w/ cough since yesterday.

## 2016-06-02 NOTE — ED Notes (Signed)
Pt O2 saturation dipped to 80%, pt placed on 2L nasal cannula. Dr. Mayford KnifeWilliams bumped up to 3L nasal cannula. Pt O2 came up to 99%. Pt has hx of COPD.

## 2016-06-02 NOTE — Progress Notes (Signed)
Pharmacy Antibiotic Note  Cheryl [name] is a 65 y.o. female admitted on 06/02/2016 with sepsis.  Pharmacy has been consulted for Zosyn and vancomycin dosing.  Plan: 1. Zosyn 3.375 gm IV Q8H EI 2. Vancomycin 1 gm IV x 1 in ED followed by vancomycin 750 mg IV Q18H, predicted trough 19 mcg/mL. Pharmacy will continue to follow and adjust as needed to maintain trough 15 to 20 mcg/mL.   Vd 31.8 L, Ke 0.045 hr-1, T1/2 15.4 hr  Height: 5\' 1"  (154.9 cm) Weight: 100 lb (45.36 kg) IBW/kg (Calculated) : 47.8  Temp (24hrs), Avg:99.5 F (37.5 C), Min:98.9 F (37.2 C), Max:100.1 F (37.8 C)   Recent Labs Lab 06/02/16 2209 06/02/16 2211  WBC 11.9*  --   CREATININE 0.79  --   LATICACIDVEN  --  0.9    Estimated Creatinine Clearance: 48.9 mL/min (by C-G formula based on Cr of 0.79).    Allergies  Allergen Reactions  . Percocet [Oxycodone-Acetaminophen] Hives and Rash  . Aspirin Nausea And Vomiting and Other (See Comments)    Reaction:  GI upset   . Codeine Nausea And Vomiting and Other (See Comments)    Reaction:  GI upset   . Morphine Other (See Comments)    Reaction:  Unknown patient states she had a reaction one time but has not had a reaction since when given morphine.   . Propoxyphene Other (See Comments) and Nausea Only    GI upset Reaction:  GI upset   . Sulfa Antibiotics Rash and Other (See Comments)    Reaction:  GI upset     Thank you for allowing pharmacy to be a part of this patient's care.  Carola FrostNathan A Kypton Eltringham, Pharm.D., BCPS Clinical Pharmacist 06/02/2016 11:04 PM

## 2016-06-03 ENCOUNTER — Emergency Department: Payer: Commercial Managed Care - HMO

## 2016-06-03 DIAGNOSIS — Z8701 Personal history of pneumonia (recurrent): Secondary | ICD-10-CM | POA: Diagnosis not present

## 2016-06-03 DIAGNOSIS — I2510 Atherosclerotic heart disease of native coronary artery without angina pectoris: Secondary | ICD-10-CM | POA: Diagnosis present

## 2016-06-03 DIAGNOSIS — M810 Age-related osteoporosis without current pathological fracture: Secondary | ICD-10-CM | POA: Diagnosis present

## 2016-06-03 DIAGNOSIS — Z9049 Acquired absence of other specified parts of digestive tract: Secondary | ICD-10-CM | POA: Diagnosis not present

## 2016-06-03 DIAGNOSIS — Z8249 Family history of ischemic heart disease and other diseases of the circulatory system: Secondary | ICD-10-CM | POA: Diagnosis not present

## 2016-06-03 DIAGNOSIS — E876 Hypokalemia: Secondary | ICD-10-CM | POA: Diagnosis present

## 2016-06-03 DIAGNOSIS — Z9889 Other specified postprocedural states: Secondary | ICD-10-CM | POA: Diagnosis not present

## 2016-06-03 DIAGNOSIS — Z8261 Family history of arthritis: Secondary | ICD-10-CM | POA: Diagnosis not present

## 2016-06-03 DIAGNOSIS — Z79899 Other long term (current) drug therapy: Secondary | ICD-10-CM | POA: Diagnosis not present

## 2016-06-03 DIAGNOSIS — Z888 Allergy status to other drugs, medicaments and biological substances status: Secondary | ICD-10-CM | POA: Diagnosis not present

## 2016-06-03 DIAGNOSIS — K567 Ileus, unspecified: Secondary | ICD-10-CM | POA: Diagnosis present

## 2016-06-03 DIAGNOSIS — Z833 Family history of diabetes mellitus: Secondary | ICD-10-CM | POA: Diagnosis not present

## 2016-06-03 DIAGNOSIS — J189 Pneumonia, unspecified organism: Secondary | ICD-10-CM | POA: Diagnosis present

## 2016-06-03 DIAGNOSIS — Z809 Family history of malignant neoplasm, unspecified: Secondary | ICD-10-CM | POA: Diagnosis not present

## 2016-06-03 DIAGNOSIS — R1310 Dysphagia, unspecified: Secondary | ICD-10-CM | POA: Diagnosis present

## 2016-06-03 DIAGNOSIS — Z9181 History of falling: Secondary | ICD-10-CM | POA: Diagnosis not present

## 2016-06-03 DIAGNOSIS — F0390 Unspecified dementia without behavioral disturbance: Secondary | ICD-10-CM | POA: Diagnosis present

## 2016-06-03 DIAGNOSIS — Z90710 Acquired absence of both cervix and uterus: Secondary | ICD-10-CM | POA: Diagnosis not present

## 2016-06-03 DIAGNOSIS — K219 Gastro-esophageal reflux disease without esophagitis: Secondary | ICD-10-CM | POA: Diagnosis present

## 2016-06-03 DIAGNOSIS — I509 Heart failure, unspecified: Secondary | ICD-10-CM | POA: Diagnosis present

## 2016-06-03 DIAGNOSIS — J440 Chronic obstructive pulmonary disease with acute lower respiratory infection: Secondary | ICD-10-CM | POA: Diagnosis present

## 2016-06-03 DIAGNOSIS — M1990 Unspecified osteoarthritis, unspecified site: Secondary | ICD-10-CM | POA: Diagnosis present

## 2016-06-03 DIAGNOSIS — Z86718 Personal history of other venous thrombosis and embolism: Secondary | ICD-10-CM | POA: Diagnosis not present

## 2016-06-03 DIAGNOSIS — J9611 Chronic respiratory failure with hypoxia: Secondary | ICD-10-CM | POA: Diagnosis present

## 2016-06-03 DIAGNOSIS — I110 Hypertensive heart disease with heart failure: Secondary | ICD-10-CM | POA: Diagnosis present

## 2016-06-03 DIAGNOSIS — Z7951 Long term (current) use of inhaled steroids: Secondary | ICD-10-CM | POA: Diagnosis not present

## 2016-06-03 DIAGNOSIS — Z882 Allergy status to sulfonamides status: Secondary | ICD-10-CM | POA: Diagnosis not present

## 2016-06-03 DIAGNOSIS — G8929 Other chronic pain: Secondary | ICD-10-CM | POA: Diagnosis present

## 2016-06-03 DIAGNOSIS — E162 Hypoglycemia, unspecified: Secondary | ICD-10-CM | POA: Diagnosis present

## 2016-06-03 DIAGNOSIS — R112 Nausea with vomiting, unspecified: Secondary | ICD-10-CM | POA: Diagnosis not present

## 2016-06-03 DIAGNOSIS — F17210 Nicotine dependence, cigarettes, uncomplicated: Secondary | ICD-10-CM | POA: Diagnosis present

## 2016-06-03 DIAGNOSIS — I739 Peripheral vascular disease, unspecified: Secondary | ICD-10-CM | POA: Diagnosis present

## 2016-06-03 LAB — BLOOD GAS, VENOUS
Acid-base deficit: 3.3 mmol/L — ABNORMAL HIGH (ref 0.0–2.0)
Bicarbonate: 24.2 mEq/L (ref 21.0–28.0)
PATIENT TEMPERATURE: 37
PCO2 VEN: 54 mmHg (ref 44.0–60.0)
PH VEN: 7.26 — AB (ref 7.320–7.430)

## 2016-06-03 LAB — BASIC METABOLIC PANEL
ANION GAP: 9 (ref 5–15)
BUN: 11 mg/dL (ref 6–20)
CO2: 23 mmol/L (ref 22–32)
Calcium: 7.8 mg/dL — ABNORMAL LOW (ref 8.9–10.3)
Chloride: 103 mmol/L (ref 101–111)
Creatinine, Ser: 0.77 mg/dL (ref 0.44–1.00)
GLUCOSE: 124 mg/dL — AB (ref 65–99)
POTASSIUM: 3.3 mmol/L — AB (ref 3.5–5.1)
Sodium: 135 mmol/L (ref 135–145)

## 2016-06-03 LAB — URINALYSIS COMPLETE WITH MICROSCOPIC (ARMC ONLY)
BACTERIA UA: NONE SEEN
BILIRUBIN URINE: NEGATIVE
Hgb urine dipstick: NEGATIVE
LEUKOCYTES UA: NEGATIVE
NITRITE: NEGATIVE
PH: 5 (ref 5.0–8.0)
Protein, ur: 30 mg/dL — AB
Specific Gravity, Urine: 1.018 (ref 1.005–1.030)

## 2016-06-03 LAB — CBC
HEMATOCRIT: 35.4 % (ref 35.0–47.0)
HEMOGLOBIN: 12.2 g/dL (ref 12.0–16.0)
MCH: 30.8 pg (ref 26.0–34.0)
MCHC: 34.5 g/dL (ref 32.0–36.0)
MCV: 89.2 fL (ref 80.0–100.0)
Platelets: 175 10*3/uL (ref 150–440)
RBC: 3.97 MIL/uL (ref 3.80–5.20)
RDW: 18.7 % — ABNORMAL HIGH (ref 11.5–14.5)
WBC: 10.3 10*3/uL (ref 3.6–11.0)

## 2016-06-03 LAB — LACTIC ACID, PLASMA: LACTIC ACID, VENOUS: 0.6 mmol/L (ref 0.5–1.9)

## 2016-06-03 MED ORDER — DEXTROSE 5 % IV SOLN
500.0000 mg | Freq: Every day | INTRAVENOUS | Status: DC
  Administered 2016-06-03 – 2016-06-05 (×3): 500 mg via INTRAVENOUS
  Filled 2016-06-03 (×3): qty 500

## 2016-06-03 MED ORDER — ACETAMINOPHEN 650 MG RE SUPP: 650.0000 mg | Freq: Four times a day (QID) | RECTAL | Status: DC | PRN

## 2016-06-03 MED ORDER — SUMATRIPTAN SUCCINATE 50 MG PO TABS: 25.0000 mg | ORAL_TABLET | ORAL | Status: DC | PRN

## 2016-06-03 MED ORDER — IOPAMIDOL (ISOVUE-300) INJECTION 61%
75.0000 mL | Freq: Once | INTRAVENOUS | Status: AC | PRN
  Administered 2016-06-03: 75 mL via INTRAVENOUS

## 2016-06-03 MED ORDER — LOPERAMIDE HCL 2 MG PO CAPS: 2.0000 mg | ORAL_CAPSULE | Freq: Four times a day (QID) | ORAL | Status: DC | PRN

## 2016-06-03 MED ORDER — ALPRAZOLAM 0.25 MG PO TABS: 0.2500 mg | ORAL_TABLET | Freq: Three times a day (TID) | ORAL | Status: DC | PRN

## 2016-06-03 MED ORDER — FENTANYL 25 MCG/HR TD PT72
25.0000 ug | MEDICATED_PATCH | TRANSDERMAL | Status: DC
  Administered 2016-06-03: 25 ug via TRANSDERMAL
  Filled 2016-06-03: qty 1

## 2016-06-03 MED ORDER — POTASSIUM CHLORIDE CRYS ER 20 MEQ PO TBCR
40.0000 meq | EXTENDED_RELEASE_TABLET | Freq: Once | ORAL | Status: AC
  Administered 2016-06-03: 40 meq via ORAL
  Filled 2016-06-03: qty 2

## 2016-06-03 MED ORDER — DEXTROSE 5 % IV SOLN
1.0000 g | Freq: Every day | INTRAVENOUS | Status: DC
  Administered 2016-06-03 – 2016-06-05 (×3): 1 g via INTRAVENOUS
  Filled 2016-06-03 (×3): qty 10

## 2016-06-03 MED ORDER — SIMVASTATIN 20 MG PO TABS
10.0000 mg | ORAL_TABLET | Freq: Every day | ORAL | Status: DC
  Administered 2016-06-03: 10 mg via ORAL
  Filled 2016-06-03: qty 1

## 2016-06-03 MED ORDER — ACETAMINOPHEN 325 MG PO TABS: 650.0000 mg | ORAL_TABLET | Freq: Four times a day (QID) | ORAL | Status: DC | PRN

## 2016-06-03 MED ORDER — METHYLPREDNISOLONE SODIUM SUCC 40 MG IJ SOLR
40.0000 mg | Freq: Two times a day (BID) | INTRAMUSCULAR | Status: DC
  Administered 2016-06-03 – 2016-06-05 (×6): 40 mg via INTRAVENOUS
  Filled 2016-06-03 (×6): qty 1

## 2016-06-03 MED ORDER — VANCOMYCIN HCL IN DEXTROSE 750-5 MG/150ML-% IV SOLN: 750.0000 mg | INTRAVENOUS | Status: DC

## 2016-06-03 MED ORDER — DONEPEZIL HCL 5 MG PO TABS
10.0000 mg | ORAL_TABLET | Freq: Every day | ORAL | Status: DC
  Administered 2016-06-03: 10 mg via ORAL
  Filled 2016-06-03: qty 2

## 2016-06-03 MED ORDER — PIPERACILLIN-TAZOBACTAM 3.375 G IVPB: 3.3750 g | Freq: Three times a day (TID) | INTRAVENOUS | Status: DC

## 2016-06-03 MED ORDER — IPRATROPIUM-ALBUTEROL 0.5-2.5 (3) MG/3ML IN SOLN: 3.0000 mL | RESPIRATORY_TRACT | Status: DC | PRN

## 2016-06-03 MED ORDER — FUROSEMIDE 40 MG PO TABS
40.0000 mg | ORAL_TABLET | Freq: Every day | ORAL | Status: DC
  Administered 2016-06-03: 40 mg via ORAL
  Filled 2016-06-03 (×2): qty 1

## 2016-06-03 MED ORDER — ONDANSETRON HCL 4 MG PO TABS: 4.0000 mg | ORAL_TABLET | Freq: Four times a day (QID) | ORAL | Status: DC | PRN

## 2016-06-03 MED ORDER — DOCUSATE SODIUM 100 MG PO CAPS
100.0000 mg | ORAL_CAPSULE | Freq: Two times a day (BID) | ORAL | Status: DC
  Administered 2016-06-03 – 2016-06-05 (×3): 100 mg via ORAL
  Filled 2016-06-03 (×4): qty 1

## 2016-06-03 MED ORDER — PANTOPRAZOLE SODIUM 40 MG PO TBEC
40.0000 mg | DELAYED_RELEASE_TABLET | Freq: Every day | ORAL | Status: DC
  Administered 2016-06-03 – 2016-06-05 (×2): 40 mg via ORAL
  Filled 2016-06-03 (×3): qty 1

## 2016-06-03 MED ORDER — HEPARIN SODIUM (PORCINE) 5000 UNIT/ML IJ SOLN
5000.0000 [IU] | Freq: Three times a day (TID) | INTRAMUSCULAR | Status: DC
  Administered 2016-06-03: 5000 [IU] via SUBCUTANEOUS
  Filled 2016-06-03: qty 1

## 2016-06-03 MED ORDER — ENOXAPARIN SODIUM 40 MG/0.4ML ~~LOC~~ SOLN
40.0000 mg | SUBCUTANEOUS | Status: DC
  Administered 2016-06-03 – 2016-06-04 (×2): 40 mg via SUBCUTANEOUS
  Filled 2016-06-03 (×2): qty 0.4

## 2016-06-03 MED ORDER — CLOPIDOGREL BISULFATE 75 MG PO TABS
75.0000 mg | ORAL_TABLET | Freq: Every day | ORAL | Status: DC
  Administered 2016-06-03 – 2016-06-05 (×2): 75 mg via ORAL
  Filled 2016-06-03 (×3): qty 1

## 2016-06-03 MED ORDER — ONDANSETRON HCL 4 MG/2ML IJ SOLN
4.0000 mg | Freq: Four times a day (QID) | INTRAMUSCULAR | Status: DC | PRN
  Administered 2016-06-04: 4 mg via INTRAVENOUS
  Filled 2016-06-03: qty 2

## 2016-06-03 NOTE — Progress Notes (Signed)
[facility] [place] [facility]   PATIENT NAME: Cheryl [name]    MR#:  [id number]  DATE OF BIRTH:  16-Oct-1951  SUBJECTIVE:  CHIEF COMPLAINT:   Chief Complaint  Patient presents with  . Code Sepsis   Sleepy, still sob and coughing REVIEW OF SYSTEMS:  Review of Systems  Constitutional: Positive for weight loss. Negative for fever, malaise/fatigue and diaphoresis.  HENT: Negative for ear discharge, ear pain, hearing loss, nosebleeds, sore throat and tinnitus.   Eyes: Negative for blurred vision and pain.  Respiratory: Positive for cough and shortness of breath. Negative for hemoptysis and wheezing.   Cardiovascular: Negative for chest pain, palpitations, orthopnea and leg swelling.  Gastrointestinal: Negative for heartburn, nausea, vomiting, abdominal pain, diarrhea, constipation and blood in stool.  Genitourinary: Negative for dysuria, urgency and frequency.  Musculoskeletal: Positive for falls. Negative for myalgias and back pain.  Skin: Negative for itching and rash.  Neurological: Positive for weakness. Negative for dizziness, tingling, tremors, focal weakness, seizures and headaches.  Psychiatric/Behavioral: Negative for depression. The patient is not nervous/anxious.     DRUG ALLERGIES:   Allergies  Allergen Reactions  . Percocet [Oxycodone-Acetaminophen] Hives and Rash  . Aspirin Nausea And Vomiting and Other (See Comments)    Reaction:  GI upset   . Codeine Nausea And Vomiting and Other (See Comments)    Reaction:  GI upset   . Morphine Other (See Comments)    Reaction:  Unknown patient states she had a reaction one time but has not had a reaction since when given morphine.   . Propoxyphene Other (See Comments) and Nausea Only    GI upset Reaction:  GI upset   . Sulfa Antibiotics Rash and Other (See Comments)    Reaction:  GI upset    VITALS:  Blood pressure 125/53, pulse 64, temperature 97.7 F (36.5 C), temperature source Oral, resp. rate  22, height  (1.549 m), weight 45.36 kg (100 lb), SpO2 91 %. PHYSICAL EXAMINATION:  Physical Exam  Constitutional: She is oriented to person, place, and time. She appears lethargic and malnourished. She appears cachectic.  HENT:  Head: Normocephalic and atraumatic.  Eyes: Conjunctivae and EOM are normal. Pupils are equal, round, and reactive to light.  Neck: Normal range of motion. Neck supple. No tracheal deviation present. No thyromegaly present.  Cardiovascular: Normal rate, regular rhythm and normal heart sounds.   Pulmonary/Chest: Accessory muscle usage present. She is in respiratory distress. She has decreased breath sounds in the right lower field and the left lower field. She has wheezes in the right lower field and the left lower field. She exhibits no tenderness.  Abdominal: Soft. Bowel sounds are normal. She exhibits no distension. There is no tenderness.  Musculoskeletal: Normal range of motion.  Neurological: She is oriented to person, place, and time. She appears lethargic. No cranial nerve deficit.  Sleepy, lethargic  Skin: Skin is warm and dry. No rash noted.  Psychiatric: Mood and affect normal.   LABORATORY PANEL:   CBC  Recent Labs Lab 06/03/16 0321  WBC 10.3  HGB 12.2  HCT 35.4  PLT 175   ------------------------------------------------------------------------------------------------------------------ Chemistries   Recent Labs Lab 06/02/16 2209 06/03/16 0321  NA 135 135  K 3.6 3.3*  CL 98* 103  CO2 23 23  GLUCOSE 52* 124*  BUN 14 11  CREATININE 0.79 0.77  CALCIUM 8.4* 7.8*  AST 28  --   ALT 14  --   ALKPHOS 121  --  BILITOT 1.2  --    RADIOLOGY:  Ct Chest W Contrast  06/03/2016  CLINICAL DATA:  Fever, hypoxia, altered mental status. Nausea. Poor p.o. intake. EXAM: CT CHEST WITH CONTRAST TECHNIQUE: Multidetector CT imaging of the chest was performed during intravenous contrast administration. CONTRAST:  75mL ISOVUE-300 IOPAMIDOL (ISOVUE-300)  INJECTION 61% COMPARISON:  10/18/2015 FINDINGS: Normal heart size. Normal caliber thoracic aorta. No aortic dissection. Great vessel origins are patent. Scattered lymph nodes throughout the mediastinum and in both hila are prominent without pathologic enlargement, likely reactive. Similar appearance to previous study. Subcarinal lymph nodes today are measuring 15 mm compared with 14.4 mm previously. Moderate-sized esophageal hiatal hernia. No esophageal dilatation. Mild emphysematous changes in the lungs with scattered peripheral fibrosis. Focal areas of nodular peribronchial infiltration demonstrated in the right middle lung and left lower lung. Bronchial wall thickening demonstrated bilaterally. Changes likely to represent multifocal bronchopneumonia. Airways appear to be patent. Small air cyst in the right lung base. No pleural effusions. No pneumothorax. Upper abdominal organs demonstrate mild central intrahepatic bile duct dilatation of nonspecific etiology. Correlate with liver function studies. Degenerative changes throughout the spine. IMPRESSION: Focal areas of nodular peribronchial infiltration demonstrated in both lungs with bronchial wall thickening diffusely likely to represent multifocal bronchopneumonia. Electronically Signed   By: Burman NievesWilliam  Stevens M.D.   On: 06/03/2016 01:34   Dg Chest Port 1 View  06/02/2016  CLINICAL DATA:  65 year old female with fever EXAM: PORTABLE CHEST 1 VIEW COMPARISON:  Chest radiograph dated 11/22/2015 FINDINGS: Single portable view of chest demonstrate a emphysematous changes of the lungs. There is no focal consolidation. No pleural effusion or pneumothorax. Mild cardiomegaly. There is osteopenia with degenerative changes of the spine. No acute fracture. IMPRESSION: No active disease. Electronically Signed   By: Elgie CollardArash  Radparvar M.D.   On: 06/02/2016 22:45   ASSESSMENT AND PLAN:  * Bronchopulmonary pneumonia - continue Oxygen, nebs, IV antibiotics  -Solu-Medrol 40  mg IV every 12 -Blood cultures neg  COPD -Solu-Medrol, Nebs - wean O2 as tolerated - pulmo rehab as an outpt  Chronic respiratory failure -Oxygen to keep sats greater than 88%  Tobacco abuse -Nicotine patch, duonebs as needed  History of dysphagia -Aspiration precautions  Mild dementia  -Stable, home medications resumed   Fall risk -Aware  Coronary artery disease -Stable, home medications resumed     All the records are reviewed and case discussed with Care Management/Social Worker. Management plans discussed with the patient, family and they are in agreement.  CODE STATUS: full code  TOTAL TIME TAKING CARE OF THIS PATIENT: 35 minutes.   More than 50% of the time was spent in counseling/coordination of care: YES  POSSIBLE D/C IN 1-2 DAYS, DEPENDING ON CLINICAL CONDITION.   [facility], Dia Jefferys M.D on 06/03/2016 at 4:18 PM  Between 7am to 6pm - Pager - [phone]  After 6pm go to [email or web address] - Social research officer, governmentpassword [email or web address] [facility]  [facility] [place] [facility]  Office  [phone]  CC: Primary care physician; Mila Merryonald Fisher, MD  Note: This dictation was prepared with Dragon dictation along with smaller phrase technology. Any transcriptional errors that result from this process are unintentional.

## 2016-06-03 NOTE — Evaluation (Signed)
Physical Therapy Evaluation Patient Details Name: Cheryl [name] MRN: [id number] DOB: 23-Oct-1951 Today's Date: 06/03/2016   History of Present Illness  presented to ER secondary to progressive cough, fever; admitted with multi-focal bronchopulmonary pneumonia.  Currently on 3L supplemental, weaned per orders to 1L during session (maintaining sats >90-91% on 1L with exertion)  Clinical Impression  Upon evaluation, patient alert and oriented to basic information; follows simple commands, though demonstrates very limited interest/engagement with PT evaluation.  Bilat UE/LE globally weak and deconditioned, though functional for basic transfers and gait.  Currently requiring supplemental O2 at 1L to maintain sats >90-91% at rest and with exertion, and requiring consistent min assist with use of RW for all mobility efforts.  Patient rather impulsive and insistent on return to bed; refused further mobility efforts or up in chair beyond 30-45 seconds. Would benefit from skilled PT to address above deficits and promote optimal return to PLOF; recommend transition to STR upon discharge from acute hospitalization, but will continue to assess/update as needed with additional mobility assessment.     Follow Up Recommendations SNF (will continue mobility assessment)    Equipment Recommendations       Recommendations for Other Services       Precautions / Restrictions Precautions Precautions: Fall Restrictions Weight Bearing Restrictions: No      Mobility  Bed Mobility Overal bed mobility: Modified Independent;Needs Assistance Bed Mobility: Supine to Sit;Sit to Supine     Supine to sit: Min assist Sit to supine: Modified independent (Device/Increase time)   General bed mobility comments: spontaneously returns to supine position multiple times with efforts at sitting/OOB  Transfers Overall transfer level: Needs assistance Equipment used: Rolling walker (2 wheeled) Transfers: Sit to/from  Stand Sit to Stand: Min assist            Ambulation/Gait Ambulation/Gait assistance: Min assist Ambulation Distance (Feet): 5 Feet Assistive device: Rolling walker (2 wheeled)       General Gait Details: forward flexed posture, impulsive with limited insight into safety.  Refused gait attempts beyond bed/chair this date  Stairs            Wheelchair Mobility    Modified Rankin (Stroke Patients Only)       Balance Overall balance assessment: Needs assistance Sitting-balance support: No upper extremity supported Sitting balance-Leahy Scale: Fair Sitting balance - Comments: very forward flexed, limited stability (due more to lack of full effort vs. functional deficit)   Standing balance support: Bilateral upper extremity supported Standing balance-Leahy Scale: Fair                               Pertinent Vitals/Pain Pain Assessment: No/denies pain    Home Living Family/patient expects to be discharged to:: Private residence Living Arrangements: Spouse/significant other Available Help at Discharge: Family;Available PRN/intermittently Type of Home: House Home Access: Ramped entrance     Home Layout: One level Home Equipment: Walker - 2 wheels;Walker - 4 wheels;Cane - single point;Shower seat;Wheelchair - manual      Prior Function Level of Independence: Needs assistance   Gait / Transfers Assistance Needed: Indep for household mobility with RW  ADL's / Homemaking Assistance Needed: Husband assists with getting in/out of shower, but able to bathe indep once in shower (on seat)  Comments: Per previous documentation, multiple falls in previous 6 months     Hand Dominance        Extremity/Trunk Assessment   Upper Extremity Assessment: Generalized  weakness           Lower Extremity Assessment: Generalized weakness (grossly at least 3+/5 throughout; globally deconditioned)         Communication   Communication: No difficulties   Cognition Arousal/Alertness: Awake/alert Behavior During Therapy: WFL for tasks assessed/performed Overall Cognitive Status: Within Functional Limits for tasks assessed                      General Comments      Exercises Other Exercises Other Exercises: O2 assessment: resting on RA 88%, resting on 1L and with exertion 90-91%.  RN informed/aware; patient on 1L end of session. Other Exercises: Return bed/chair transfer with RW, min assist +1. Patient adamantly refusing to remain OOB to chair despite encouragement.      Assessment/Plan    PT Assessment Patient needs continued PT services  PT Diagnosis Difficulty walking;Generalized weakness   PT Problem List Decreased strength;Decreased activity tolerance;Decreased balance;Decreased mobility;Decreased safety awareness;Decreased knowledge of use of DME;Decreased knowledge of precautions  PT Treatment Interventions DME instruction;Gait training;Functional mobility training;Therapeutic activities;Therapeutic exercise;Balance training;Patient/family education   PT Goals (Current goals can be found in the Care Plan section) Acute Rehab PT Goals Patient Stated Goal: to cover up-I'm cold PT Goal Formulation: With patient Time For Goal Achievement: 06/17/16 Potential to Achieve Goals: Fair    Frequency Min 2X/week   Barriers to discharge Decreased caregiver support      Co-evaluation               End of Session Equipment Utilized During Treatment: Gait belt Activity Tolerance: Patient tolerated treatment well Patient left: in bed;with bed alarm set;with call bell/phone within reach Nurse Communication: Mobility status         Time: 1440-1455 PT Time Calculation (min) (ACUTE ONLY): 15 min   Charges:   PT Evaluation $PT Eval Low Complexity: 1 Procedure PT Treatments $Therapeutic Activity: 8-22 mins   PT G Codes:        Kaycen Whitworth H. Manson Passey, PT, DPT, NCS 06/03/2016, 4:24 PM [phone]

## 2016-06-03 NOTE — Care Management Note (Signed)
Case Management Note  Patient Details  Name: Cheryl [name] MRN: [id number] Date of Birth: 1951-05-20  Subjective/Objective:       Discussed case with CSW Fredric MareBailey who spoke with patient husband. Husband stated that patient has had [facility] prior and would like to continue. Patient poor historian will follow for discharge needs. Patient has rolling walker at home.  More to follow.              Action/Plan: Home with Home Health.   Expected Discharge Date:                  Expected Discharge Plan:  Home w Home Health Services  In-House Referral:     Discharge planning Services  CM Consult  Post Acute Care Choice:    Choice offered to:     DME Arranged:    DME Agency:     HH Arranged:    HH Agency:  [facility]  Status of Service:  In process, will continue to follow  If discussed at Long Length of Stay Meetings, dates discussed:    Additional Comments:  Adonis HugueninBerkhead, [name], RN 06/03/2016, 12:01 PM

## 2016-06-03 NOTE — ED Notes (Signed)
Bed not ready yet

## 2016-06-03 NOTE — Clinical Social Work Note (Signed)
Clinical Social Work Assessment  Patient Details  Name: Cheryl [name] MRN: [id number] Date of Birth: 10/19/1951  Date of referral:  06/03/16               Reason for consult:  Facility Placement                Permission sought to share information with:  Facility Contact Representative Permission granted to share information::  No  Name::        Agency::     Relationship::     Contact Information:     Housing/Transportation Living arrangements for the past 2 months:  Single Family Home Source of Information:  Patient, Spouse Patient Interpreter Needed:  None Criminal Activity/Legal Involvement Pertinent to Current Situation/Hospitalization:  No - Comment as needed Significant Relationships:  Spouse, Adult Children Lives with:  Spouse Do you feel safe going back to the place where you live?  Yes Need for family participation in patient care:  Yes (Comment)  Care giving concerns:  Patient lives in [place] with her husband Frank.    Social Worker assessment / plan:  Clinical Social Worker (CSW) reviewed chart and noted that patient has been to SNF several times before. CSW met with patient alone at bedside at [facility]. Patient reported that she lives in [place] with her husband Frank. Patient appeared lethargic and asked CSW to call her husband about the D/C plan. CSW called patient's husband Frank who reported that he is coming to the patient's room now. CSW met with Frank at bedside. Per husband patient walks with a walker at baseline and he can take care of her at home. Husband prefers to take patient home from hospital. Husband requested [facility]. RN Case Manager is aware of above. Husband reported that patient is never left alone and he can be at home from work within a few minutes. Per husband he has his own business and his hours are flexible. CSW will continue to follow and assist as needed.   Employment status:  Disabled (Comment on whether or not currently  receiving Disability), Retired Insurance information:  Medicaid In State, Managed Medicare PT Recommendations:  Not assessed at this time Information / Referral to community resources:  Other (Comment Required) (RN Case Manager to arrange Home Health )  Patient/Family's Response to care:  Patient's husband Frank prefers to take patient home.   Patient/Family's Understanding of and Emotional Response to Diagnosis, Current Treatment, and Prognosis:  Patient and husband were pleasant and thanked CSW for visit.   Emotional Assessment Appearance:  Appears stated age Attitude/Demeanor/Rapport:  Lethargic Affect (typically observed):  Pleasant Orientation:  Oriented to Self, Oriented to  Time, Fluctuating Orientation (Suspected and/or reported Sundowners) Alcohol / Substance use:  Not Applicable Psych involvement (Current and /or in the community):  No (Comment)  Discharge Needs  Concerns to be addressed:  Discharge Planning Concerns Readmission within the last 30 days:  No Current discharge risk:  Dependent with Mobility Barriers to Discharge:  Continued Medical Work up   Morgan,  G, LCSW 06/03/2016, 12:17 PM  

## 2016-06-03 NOTE — Progress Notes (Signed)
   06/03/16 1400  Clinical Encounter Type  Visited With Patient  Visit Type Initial  Consult/Referral To Chaplain  Rounded in unit and offered pastoral care.   Chaplain Nahima Ales Ext:[phone] 

## 2016-06-03 NOTE — [place] FL2 (Signed)
[place] MEDICAID FL2 LEVEL OF CARE SCREENING TOOL     IDENTIFICATION  Patient Name: Cheryl MargaritaMary George [name] Birthdate: 1950/12/15 Sex: female Admission Date (Current Location): 06/02/2016  [facility] [place]  [place]  ([id number] [facility]) Facility and Address:  [facility], [address], [place], [place] [address]      Provider Number: [id number]  Attending Physician Name and Address:  Delfino LovettVipul Shah, MD  Relative Name and Phone Number:       Current Level of Care: Hospital Recommended Level of Care: Skilled Nursing Facility Prior Approval Number:    Date Approved/Denied:   PASRR Number:  [id number])  Discharge Plan: SNF    Current Diagnoses: Patient Active Problem List   Diagnosis Date Noted  . Pneumonia 06/03/2016  . Lumbar facet syndrome (Location of Primary Source of Pain) (Bilateral) 03/20/2016  . Lumbar spondylosis 03/20/2016  . Long term prescription opiate use 02/28/2016  . Opiate use (160 MME/Day) 02/28/2016  . Anemia 02/28/2016  . Osteoarthrosis 12/18/2015  . Long term current use of anticoagulant therapy (Plavix) 12/18/2015  . Long term current use of opiate analgesic 12/04/2015  . Chronic pain 12/04/2015  . Fibromyalgia 12/04/2015  . Dysphagia 11/24/2015  . Acute diastolic CHF (congestive heart failure) (HCC) 11/24/2015  . Abnormal mammogram of right breast 10/11/2015  . Dilated intrahepatic bile duct 10/11/2015  . Lumbar radicular pain 09/04/2015  . Chronic low back pain 09/04/2015  . Encounter for therapeutic drug level monitoring 09/04/2015  . Uncomplicated opioid dependence (HCC) 09/04/2015  . Chronic pain syndrome 09/04/2015  . Platelet inhibition due to Plavix 09/04/2015  . COPD (chronic obstructive pulmonary disease) (HCC) 07/31/2015  . Dementia 07/31/2015  . Airway hyperreactivity 07/03/2015  . Back pain, thoracic 07/03/2015  . Excessive falling 07/03/2015  . Alteration in bowel elimination: incontinence 07/03/2015   . Insomnia 07/03/2015  . Decreased testosterone level 07/03/2015  . Leg weakness 07/03/2015  . Menopausal symptom 07/03/2015  . Migraine 07/03/2015  . Neuropathy (HCC) 07/03/2015  . Fecal occult blood test positive 07/03/2015  . OP (osteoporosis) 07/03/2015  . Panic disorder 07/03/2015  . Compulsive tobacco user syndrome 07/03/2015  . Urinary incontinence 07/03/2015  . Weight loss 07/03/2015  . Essential hypertension 06/06/2015  . GERD (gastroesophageal reflux disease) 06/06/2015  . Hyperlipemia 06/06/2015  . 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 Anxiety 02/10/2014  . Coronary artery disease 02/10/2014  . Hypercholesteremia 02/10/2014  . Peripheral vascular disease (HCC) 02/10/2014  . Vitamin D deficiency 10/16/2009    Orientation RESPIRATION BLADDER Height & Weight     Self, Place  O2 (3 Liters Oxygen ) Continent Weight: 100 lb (45.36 kg) Height:  5\' 1"  (154.9 cm)  BEHAVIORAL SYMPTOMS/MOOD NEUROLOGICAL BOWEL NUTRITION STATUS   (none )  (none ) Continent Diet (Diet: Heart Healthy )  AMBULATORY STATUS COMMUNICATION OF NEEDS Skin   Extensive Assist Verbally Normal                       Personal Care Assistance Level of Assistance  Bathing, Feeding, Dressing Bathing Assistance: Limited assistance Feeding assistance: Independent Dressing Assistance: Limited assistance     Functional Limitations Info  Sight, Hearing, Speech Sight Info: Adequate Hearing Info: Adequate Speech Info: Adequate    SPECIAL CARE FACTORS FREQUENCY  PT (By licensed PT), OT (By licensed OT)     PT Frequency:  (5) OT Frequency:  (5)            Contractures  Additional Factors Info  Code Status, Allergies Code Status Info:  (Full Code. ) Allergies Info:  (Percocet, Aspirin, Codeine, Morphine, Propoxyphene, Sulfa Antibiotics)           Current Medications (06/03/2016):  This is the current hospital active medication list Current Facility-Administered Medications   Medication Dose Route Frequency Provider Last Rate Last Dose  . acetaminophen (TYLENOL) tablet 650 mg  650 mg Oral Q6H PRN Debby Crosley, MD       Or  . acetaminophen (TYLENOL) suppository 650 mg  650 mg Rectal Q6H PRN Debby Crosley, MD      . ALPRAZolam Prudy Feeler) tablet 0.25 mg  0.25 mg Oral TID PRN Gery Pray, MD      . azithromycin (ZITHROMAX) 500 mg in dextrose 5 % 250 mL IVPB  500 mg Intravenous Daily Debby Crosley, MD   500 mg at 06/03/16 6578  . cefTRIAXone (ROCEPHIN) 1 g in dextrose 5 % 50 mL IVPB  1 g Intravenous Daily Debby Crosley, MD   1 g at 06/03/16 0844  . clopidogrel (PLAVIX) tablet 75 mg  75 mg Oral Daily Debby Crosley, MD   75 mg at 06/03/16 0844  . docusate sodium (COLACE) capsule 100 mg  100 mg Oral BID Debby Crosley, MD   100 mg at 06/03/16 0845  . donepezil (ARICEPT) tablet 10 mg  10 mg Oral QHS Debby Crosley, MD      . fentaNYL (DURAGESIC - dosed mcg/hr) patch 25 mcg  25 mcg Transdermal Q72H Debby Crosley, MD   25 mcg at 06/03/16 0845  . furosemide (LASIX) tablet 40 mg  40 mg Oral Daily Debby Crosley, MD   40 mg at 06/03/16 0844  . heparin injection 5,000 Units  5,000 Units Subcutaneous Q8H Debby Crosley, MD   5,000 Units at 06/03/16 4696  . ipratropium-albuterol (DUONEB) 0.5-2.5 (3) MG/3ML nebulizer solution 3 mL  3 mL Nebulization Q4H PRN Debby Crosley, MD      . loperamide (IMODIUM) capsule 2 mg  2 mg Oral QID PRN Debby Crosley, MD      . methylPREDNISolone sodium succinate (SOLU-MEDROL) 40 mg/mL injection 40 mg  40 mg Intravenous Q12H Debby Crosley, MD   40 mg at 06/03/16 0658  . ondansetron (ZOFRAN) tablet 4 mg  4 mg Oral Q6H PRN Debby Crosley, MD       Or  . ondansetron (ZOFRAN) injection 4 mg  4 mg Intravenous Q6H PRN Debby Crosley, MD      . pantoprazole (PROTONIX) EC tablet 40 mg  40 mg Oral QAC breakfast Debby Crosley, MD   40 mg at 06/03/16 0844  . simvastatin (ZOCOR) tablet 10 mg  10 mg Oral QHS Debby Crosley, MD      . SUMAtriptan (IMITREX) tablet 25 mg  25  mg Oral PRN Gery Pray, MD         Discharge Medications: Please see discharge summary for a list of discharge medications.  Relevant Imaging Results:  Relevant Lab Results:   Additional Information  (SSN: [id number])  Haig Prophet, LCSW

## 2016-06-03 NOTE — ED Notes (Signed)
Transporting patient to room 133-1A

## 2016-06-03 NOTE — Progress Notes (Signed)
PT is recommending SNF. Clinical Social Worker (CSW) contacted patient's husband [name] to discuss D/C plan. Husband is agreeable to SNF search and prefers [facility] however he would like to see how patient is doing tomorrow and is considering taking her home. FL2 complete and faxed out. CSW will continue to follow and assist as needed.   Jetta LoutBailey Morgan, LCSW [phone]

## 2016-06-03 NOTE — H&P (Signed)
PCP:   Mila Merry, MD   Chief Complaint:  Not feeling well  HPI: This is a 65 year old female who has COPD, chronic restrictive failure, ongoing tobacco use. On Friday she started not feeling good. He got sick progressively worse. She developed a nonproductive cough that was rocking. She denies any wheezes or shortness of breath. She reports chills and fever up to 102. She also has myalgia but this is chronic. She finally came to ER. History provided mainly by the patient's husband asked the patient is curled up in bed, not feeling well.  Review of Systems:  The patient denies anorexia, fever, weight loss,, vision loss, decreased hearing, hoarseness, chest pain, syncope, dyspnea on exertion, peripheral edema, balance deficits, hemoptysis, abdominal pain, melena, hematochezia, severe indigestion/heartburn, hematuria, incontinence, genital sores, muscle weakness, suspicious skin lesions, transient blindness, difficulty walking, depression, unusual weight change, abnormal bleeding, enlarged lymph nodes, angioedema, and breast masses.  Past Medical History: Past Medical History  Diagnosis Date  . COPD (chronic obstructive pulmonary disease) (HCC)   . Hypertension   . Arthritis   . Hyperlipidemia   . GERD (gastroesophageal reflux disease)   . Neuropathy (HCC) 2010  . Oxygen deficiency   . Coronary artery disease   . Headache   . Anxiety   . Peripheral vascular disease (HCC)   . Vitamin D deficiency   . Migraines   . Chronic pain   . DVT (deep venous thrombosis) (HCC)   . Pneumonia   . Osteoporosis   . Allergy   . Asthma   . Aspiration pneumonitis (HCC) 11/24/2015  . Pneumonia 11/19/2015  . CHF (congestive heart failure) [facility])    Past Surgical History  Procedure Laterality Date  . Appendectomy    . Spine surgery    . Foot surgery Bilateral     5-6 years per patient  . Cardiac catherization  10/31/2009  . Abdomnal aortic stent  05/30/2008    Dr. Nanetta Batty  . Abdominal  hysterectomy  1975    Bilaterl Oophorectomy; Dur to IUD infection  . Cholecystectomy  1972  . Cervical fusion  C5 - 6/C6-7  . Appendectomy    . Colonoscopy with propofol N/A 07/27/2015    Procedure: COLONOSCOPY WITH PROPOFOL;  Surgeon: Wallace Cullens, MD;  Location[facility];  Service: Gastroenterology;  Laterality: N/A;  . Esophagogastroduodenoscopy (egd) with propofol N/A 07/27/2015    Procedure: ESOPHAGOGASTRODUODENOSCOPY (EGD) WITH PROPOFOL;  Surgeon: Wallace Cullens, MD;  Location: [facility] END[facility]COPY;  Service: Gastroenterology;  Laterality: N/A;    Medications: Prior to Admission medications   Medication Sig Start Date End Date Taking? Authorizing Provider  acetaminophen (TYLENOL) 500 MG tablet Take 1,000 mg by mouth every 6 (six) hours as needed for mild pain or headache.    Yes Historical Provider, MD  albuterol (PROVENTIL HFA;VENTOLIN HFA) 108 (90 BASE) MCG/ACT inhaler Inhale 2 puffs into the lungs every 4 (four) hours as needed for wheezing or shortness of breath. 08/11/15  Yes Malva Limes, MD  albuterol (PROVENTIL) (2.5 MG/3ML) 0.083% nebulizer solution Take 3 mLs (2.5 mg total) by nebulization every 4 (four) hours as needed for wheezing. 10/12/15  Yes Katharina Caper, MD  ALPRAZolam Prudy Feeler) 0.5 MG tablet take 1 tablet by mouth every 6 hours if needed 03/21/16  Yes Malva Limes, MD  clopidogrel (PLAVIX) 75 MG tablet Take 75 mg by mouth daily.   Yes Historical Provider, MD  cyclobenzaprine (FLEXERIL) 5 MG tablet take 1-2 tablets by mouth every 8 hours if  needed 04/23/16  Yes Malva Limes, MD  diphenoxylate-atropine (LOMOTIL) 2.5-0.025 MG per tablet Take 2 tablets by mouth 4 (four) times daily as needed for diarrhea or loose stools. Reported on 03/11/2016   Yes Historical Provider, MD  donepezil (ARICEPT) 10 MG tablet Take 10 mg by mouth at bedtime.   Yes Historical Provider, MD  estropipate (OGEN) 0.75 MG tablet Take 1 tablet (0.75 mg total) by mouth daily. 05/21/16  Yes Richard Hulen Shouts.,  MD  fentaNYL (DURAGESIC - DOSED MCG/HR) 50 MCG/HR Place 1 patch (50 mcg total) onto the skin every 3 (three) days. 03/20/16  Yes Delano Metz, MD  fluticasone (FLONASE) 50 MCG/ACT nasal spray Place 2 sprays into both nostrils daily. 04/09/16  Yes Malva Limes, MD  Fluticasone-Salmeterol (ADVAIR) 250-50 MCG/DOSE AEPB Inhale 1 puff into the lungs 2 (two) times daily.   Yes Historical Provider, MD  furosemide (LASIX) 40 MG tablet Take 40 mg by mouth daily.   Yes Historical Provider, MD  pantoprazole (PROTONIX) 40 MG tablet Take 40 mg by mouth daily.    Yes Historical Provider, MD  pregabalin (LYRICA) 150 MG capsule Take 1 capsule (150 mg total) by mouth every 8 (eight) hours. 04/23/16  Yes Delano Metz, MD  simvastatin (ZOCOR) 10 MG tablet Take 10 mg by mouth at bedtime. Reported on 12/04/2015   Yes Historical Provider, MD  SUMAtriptan (IMITREX) 25 MG tablet Take 25 mg by mouth as needed for migraine. May repeat in 2 hours if headache persists or recurs.   Yes Historical Provider, MD  tiotropium (SPIRIVA) 18 MCG inhalation capsule Place 1 capsule (18 mcg total) into inhaler and inhale daily. 10/25/15  Yes Malva Limes, MD  traMADol (ULTRAM) 50 MG tablet Take 2 tablets (100 mg total) by mouth every 6 (six) hours as needed for severe pain. 04/23/16  Yes Delano Metz, MD    Allergies:   Allergies  Allergen Reactions  . Percocet [Oxycodone-Acetaminophen] Hives and Rash  . Aspirin Nausea And Vomiting and Other (See Comments)    Reaction:  GI upset   . Codeine Nausea And Vomiting and Other (See Comments)    Reaction:  GI upset   . Morphine Other (See Comments)    Reaction:  Unknown patient states she had a reaction one time but has not had a reaction since when given morphine.   . Propoxyphene Other (See Comments) and Nausea Only    GI upset Reaction:  GI upset   . Sulfa Antibiotics Rash and Other (See Comments)    Reaction:  GI upset     Social History:  reports that she has  been smoking Cigarettes.  She has a 25 pack-year smoking history. She does not have any smokeless tobacco history on file. She reports that she does not drink alcohol or use illicit drugs.  Family History: Family History  Problem Relation Age of Onset  . Cancer Mother   . Arthritis Mother   . Heart disease Mother   . Diabetes Mother     mellitus, type 2  . Heart disease Father   . Diabetes Sister   . Cancer Brother   . Cancer Brother     lung  . Diabetes Brother     Physical Exam: Filed Vitals:   06/03/16 0100 06/03/16 0115 06/03/16 0130 06/03/16 0143  BP: 111/49 132/55 130/66 130/66  Pulse: 65 75 76 65  Temp:    98.5 F (36.9 C)  TempSrc:    Oral  Resp: 19  21 21 18   Height:      Weight:      SpO2: 95% 95% 96% 96%    General:  Alert and oriented times three, well developed and nourished, Sleeping Eyes: PERRLA, pink conjunctiva, no scleral icterus ENT: Moist oral mucosa, neck supple, no thyromegaly Lungs: clear to ascultation, Coarse anterior chest wall, no wheeze, no crackles, no use of accessory muscles Cardiovascular: regular rate and rhythm, no regurgitation, no gallops, no murmurs. No carotid bruits, no JVD Abdomen: soft, positive BS, non-tender, non-distended, no organomegaly, not an acute abdomen GU: not examined Neuro: CN II - XII grossly intact, sensation intact Musculoskeletal: strength 5/5 all extremities, no clubbing, cyanosis or edema Skin: no rash, no subcutaneous crepitation, no decubitus Psych: appropriate patient   Labs on Admission:   Recent Labs  06/02/16 2209  NA 135  K 3.6  CL 98*  CO2 23  GLUCOSE 52*  BUN 14  CREATININE 0.79  CALCIUM 8.4*    Recent Labs  06/02/16 2209  AST 28  ALT 14  ALKPHOS 121  BILITOT 1.2  PROT 6.7  ALBUMIN 3.3*   No results for input(s): LIPASE, AMYLASE in the last 72 hours.  Recent Labs  06/02/16 2209  WBC 11.9*  NEUTROABS 10.2*  HGB 11.8*  HCT 35.7  MCV 89.3  PLT 210    Recent Labs   06/02/16 2209  TROPONINI <0.03   Invalid input(s): POCBNP No results for input(s): DDIMER in the last 72 hours. No results for input(s): HGBA1C in the last 72 hours. No results for input(s): CHOL, HDL, LDLCALC, TRIG, CHOLHDL, LDLDIRECT in the last 72 hours. No results for input(s): TSH, T4TOTAL, T3FREE, THYROIDAB in the last 72 hours.  Invalid input(s): FREET3 No results for input(s): VITAMINB12, FOLATE, FERRITIN, TIBC, IRON, RETICCTPCT in the last 72 hours.  Micro Results: No results found for this or any previous visit (from the past 240 hour(s)).   Radiological Exams on Admission: Ct Chest W Contrast  06/03/2016  CLINICAL DATA:  Fever, hypoxia, altered mental status. Nausea. Poor p.o. intake. EXAM: CT CHEST WITH CONTRAST TECHNIQUE: Multidetector CT imaging of the chest was performed during intravenous contrast administration. CONTRAST:  75mL ISOVUE-300 IOPAMIDOL (ISOVUE-300) INJECTION 61% COMPARISON:  10/18/2015 FINDINGS: Normal heart size. Normal caliber thoracic aorta. No aortic dissection. Great vessel origins are patent. Scattered lymph nodes throughout the mediastinum and in both hila are prominent without pathologic enlargement, likely reactive. Similar appearance to previous study. Subcarinal lymph nodes today are measuring 15 mm compared with 14.4 mm previously. Moderate-sized esophageal hiatal hernia. No esophageal dilatation. Mild emphysematous changes in the lungs with scattered peripheral fibrosis. Focal areas of nodular peribronchial infiltration demonstrated in the right middle lung and left lower lung. Bronchial wall thickening demonstrated bilaterally. Changes likely to represent multifocal bronchopneumonia. Airways appear to be patent. Small air cyst in the right lung base. No pleural effusions. No pneumothorax. Upper abdominal organs demonstrate mild central intrahepatic bile duct dilatation of nonspecific etiology. Correlate with liver function studies. Degenerative changes  throughout the spine. IMPRESSION: Focal areas of nodular peribronchial infiltration demonstrated in both lungs with bronchial wall thickening diffusely likely to represent multifocal bronchopneumonia. Electronically Signed   By: Burman NievesWilliam  Stevens M.D.   On: 06/03/2016 01:34   Dg Chest Port 1 View  06/02/2016  CLINICAL DATA:  65 year old female with fever EXAM: PORTABLE CHEST 1 VIEW COMPARISON:  Chest radiograph dated 11/22/2015 FINDINGS: Single portable view of chest demonstrate a emphysematous changes of the lungs. There is no focal  consolidation. No pleural effusion or pneumothorax. Mild cardiomegaly. There is osteopenia with degenerative changes of the spine. No acute fracture. IMPRESSION: No active disease. Electronically Signed   By: Elgie Collard M.D.   On: 06/02/2016 22:45    Assessment/Plan Present on Admission:  .bronchopulmonary pneumonia -Admit patient to MedSurg -Oxygen, doing nebs, IV antibiotics ordered -Solu-Medrol 40 mg IV every 12 -Blood cultures 2  COPD -Solu-Medrol.  Chronic respiratory failure -Oxygen to keep sats greater than 88%  Tobacco abuse -Nicotine patch, duonebs as needed  History of dysphagia -Aspiration precautions  Mild dementia  -Stable, home medications resumed   Fall risk -Aware  Coronary artery disease -Stable, home medications resumed   Armanda Forand 06/03/2016, 1:46 AM

## 2016-06-03 NOTE — Care Management Important Message (Signed)
Important Message  Patient Details  Name: Cheryl [name] MRN: [id number] Date of Birth: [birth year]   Medicare Important Message Given:  Yes    Adonis HugueninBerkhead, Sarina Robleto L, RN 06/03/2016, 10:28 AM

## 2016-06-03 NOTE — Progress Notes (Addendum)
Pt to floor, alert but unable to answer any admission questions and no family present to assist with questions

## 2016-06-04 ENCOUNTER — Inpatient Hospital Stay: Payer: Commercial Managed Care - HMO

## 2016-06-04 DIAGNOSIS — R112 Nausea with vomiting, unspecified: Secondary | ICD-10-CM

## 2016-06-04 LAB — BASIC METABOLIC PANEL
Anion gap: 13 (ref 5–15)
BUN: 16 mg/dL (ref 6–20)
CALCIUM: 8.5 mg/dL — AB (ref 8.9–10.3)
CO2: 25 mmol/L (ref 22–32)
CREATININE: 0.69 mg/dL (ref 0.44–1.00)
Chloride: 99 mmol/L — ABNORMAL LOW (ref 101–111)
GFR calc non Af Amer: >60 mL/min (ref >60)
Glucose, Bld: 99 mg/dL (ref 65–99)
Potassium: 3.2 mmol/L — ABNORMAL LOW (ref 3.5–5.1)
SODIUM: 137 mmol/L (ref 135–145)

## 2016-06-04 LAB — MAGNESIUM: MAGNESIUM: 1.9 mg/dL (ref 1.7–2.4)

## 2016-06-04 LAB — URINE CULTURE: Culture: NO GROWTH

## 2016-06-04 LAB — PHOSPHORUS: PHOSPHORUS: 2.5 mg/dL (ref 2.5–4.6)

## 2016-06-04 MED ORDER — TIOTROPIUM BROMIDE MONOHYDRATE 18 MCG IN CAPS
18.0000 ug | ORAL_CAPSULE | Freq: Every day | RESPIRATORY_TRACT | Status: DC
  Administered 2016-06-04 – 2016-06-05 (×2): 18 ug via RESPIRATORY_TRACT
  Filled 2016-06-04: qty 5

## 2016-06-04 MED ORDER — METOCLOPRAMIDE HCL 5 MG/ML IJ SOLN
5.0000 mg | Freq: Three times a day (TID) | INTRAMUSCULAR | Status: DC
  Administered 2016-06-04 – 2016-06-05 (×4): 5 mg via INTRAVENOUS
  Filled 2016-06-04 (×4): qty 2

## 2016-06-04 MED ORDER — FUROSEMIDE 10 MG/ML IJ SOLN
40.0000 mg | Freq: Every day | INTRAMUSCULAR | Status: DC
  Administered 2016-06-04 – 2016-06-05 (×2): 40 mg via INTRAVENOUS
  Filled 2016-06-04 (×2): qty 4

## 2016-06-04 MED ORDER — SODIUM CHLORIDE 0.45 % IV SOLN
INTRAVENOUS | Status: DC
  Administered 2016-06-04: 11:00:00 via INTRAVENOUS
  Filled 2016-06-04 (×3): qty 1000

## 2016-06-04 MED ORDER — POTASSIUM CHLORIDE CRYS ER 20 MEQ PO TBCR
40.0000 meq | EXTENDED_RELEASE_TABLET | Freq: Once | ORAL | Status: DC
  Filled 2016-06-04: qty 2

## 2016-06-04 NOTE — Progress Notes (Signed)
PHARMACY CONSULT NOTE - INITIAL  Pharmacy Consult for Electrolyte Monitoring and Replacement  Patient Measurements: Height: 5\' 1"  (154.9 cm) Weight: 100 lb (45.36 kg) IBW/kg (Calculated) : 47.8   Recent Labs  06/02/16 2209 06/03/16 0321  WBC 11.9* 10.3  HGB 11.8* 12.2  HCT 35.7 35.4  PLT 210 175    Recent Labs  06/02/16 2209 06/03/16 0321 06/04/16 0531  NA 135 135 137  K 3.6 3.3* 3.2*  CL 98* 103 99*  CO2 23 23 25   GLUCOSE 52* 124* 99  BUN 14 11 16   CREATININE 0.79 0.77 0.69  CALCIUM 8.4* 7.8* 8.5*  MG  --   --  1.9  PHOS  --   --  2.5  PROT 6.7  --   --   ALBUMIN 3.3*  --   --   AST 28  --   --   ALT 14  --   --   ALKPHOS 121  --   --   BILITOT 1.2  --   --    Estimated Creatinine Clearance: 48.9 mL/min (by C-G formula based on Cr of 0.69).    Recent Labs  06/02/16 2338  GLUCAP 191*   Assessment: 65 yo female with PNA. Pharmacy consulted for electrolyte monitoring and replacement.  Patient has already received Potassium supplementation today, with follow up labs scheduled for AM.   7/11: Mag: 1.9   Phos: 2.5  Plan:  Mag and Phos WNL today. No supplementation at this. Pharmacy will continue to follow per consult.   Cher NakaiSheema Eriona Kinchen, PharmD Clinical Pharmacist 06/04/2016 3:55 PM

## 2016-06-04 NOTE — Progress Notes (Signed)
Clinical Child psychotherapistocial Worker (CSW) presented bed offers to patient's husband [name] at bedside. Husband chose [facility]. [facility] has a past due balance from her last admission of $638.00 and husband will need to get with the business office to make payments. Husband is agreeable to make payments towards the balance. Per husband he would like for PT to work with patient again today to see if she can go home. Amy [employer] [facility] case manager is aware of above. Plan is for patient to D/C tomorrow pending medical clearance. CSW will continue to follow and assist as needed.   Jetta LoutBailey Morgan, LCSW [phone]

## 2016-06-04 NOTE — Progress Notes (Signed)
Physical Therapy Treatment Patient Details Name: Cheryl [name] MRN: [id number] DOB: [birth year] Today's Date: 06/04/2016    History of Present Illness presented to ER secondary to progressive cough, fever; admitted with multi-focal bronchopulmonary pneumonia.  Currently on 3L supplemental, weaned per orders to 1L during session (maintaining sats >90-91% on 1L with exertion)    PT Comments    Patient with slight improvement in functional mobility and overall activity tolerance this date, but remains limited by fatigue and general malaise (has had spells of nausea/vomiting this am; results of abdominal x-ray pending).  Significant encouragement for mobility efforts this date; continues to require min assist at all times and refuses distance beyond 10' due to fatigue.  Supplemental O2 required to maintain sats >88% with mobility.  SaO2 on room air at rest = 86% SaO2 on 2L at rest = 90% SaO2 on 2 liters of O2 while ambulating = 89%   Follow Up Recommendations  SNF     Equipment Recommendations       Recommendations for Other Services       Precautions / Restrictions Precautions Precautions: Fall Restrictions Weight Bearing Restrictions: No    Mobility  Bed Mobility Overal bed mobility: Needs Assistance Bed Mobility: Supine to Sit     Supine to sit: Min assist;Mod assist        Transfers Overall transfer level: Needs assistance Equipment used: Rolling walker (2 wheeled) Transfers: Sit to/from Stand Sit to Stand: Min assist         General transfer comment: requires UE support to complete movement transition  Ambulation/Gait Ambulation/Gait assistance: Min assist Ambulation Distance (Feet): 8 Feet Assistive device: Rolling walker (2 wheeled)       General Gait Details: forward flexed posture, short shuffling steps; very effortful gait performance.  Unable to tolerate additional distance due to fatigue.  Currently requiring 2L supplemental O2 to maintain sats  >88% with exertion.   Stairs            Wheelchair Mobility    Modified Rankin (Stroke Patients Only)       Balance                                    Cognition Arousal/Alertness: Awake/alert Behavior During Therapy: WFL for tasks assessed/performed Overall Cognitive Status: Within Functional Limits for tasks assessed                      Exercises Other Exercises Other Exercises: Sit/stand from various height surfaces (i.e., edge of bed, standard chair with armrests), min assist wiht RW.  Unable to complete without UE support. Other Exercises: Additional gait x10' for return to recliner, min assist.  Min cuing for walker safety (tends to step away from when approaching seating surface)    General Comments        Pertinent Vitals/Pain Pain Assessment: No/denies pain    Home Living                      Prior Function            PT Goals (current goals can now be found in the care plan section) Acute Rehab PT Goals Patient Stated Goal: to cover up-I'm cold PT Goal Formulation: With patient Time For Goal Achievement: 06/17/16 Potential to Achieve Goals: Fair Progress towards PT goals: Progressing toward goals    Frequency  Min 2X/week  PT Plan Current plan remains appropriate    Co-evaluation             End of Session Equipment Utilized During Treatment: Gait belt Activity Tolerance: Patient limited by fatigue Patient left: in chair;with call bell/phone within reach;with chair alarm set;with family/visitor present     Time: 7829-5621 PT Time Calculation (min) (ACUTE ONLY): 19 min  Charges:  $Gait Training: 8-22 mins                    G Codes:      Addysyn Fern H. Manson Passey, PT, DPT, NCS 06/04/2016, 11:51 AM [phone]

## 2016-06-04 NOTE — Clinical Social Work Placement (Signed)
   CLINICAL SOCIAL WORK PLACEMENT  NOTE  Date:  06/04/2016  Patient Details  Name: Cheryl [name] MRN: [id number] Date of Birth: Oct 09, 1951  Clinical Social Work is seeking post-discharge placement for this patient at the Skilled  Nursing Facility level of care (*CSW will initial, date and re-position this form in  chart as items are completed):  Yes   Patient/family provided with [facility] Clinical Social Work Department's list of facilities offering this level of care within the geographic area requested by the patient (or if unable, by the patient's family).  Yes   Patient/family informed of their freedom to choose among providers that offer the needed level of care, that participate in Medicare, Medicaid or managed care program needed by the patient, have an available bed and are willing to accept the patient.  Yes   Patient/family informed of 's ownership interest in [facility] and [facility], as well as of the fact that they are under no obligation to receive care at these facilities.  PASRR submitted to EDS on       PASRR number received on       Existing PASRR number confirmed on 06/03/16     FL2 transmitted to all facilities in geographic area requested by pt/family on 06/03/16     FL2 transmitted to all facilities within larger geographic area on       Patient informed that his/her managed care company has contracts with or will negotiate with certain facilities, including the following:        Yes   Patient/family informed of bed offers received.  Patient chooses bed at  [facility] )     Physician recommends and patient chooses bed at      Patient to be transferred to   on  .  Patient to be transferred to facility by       Patient family notified on   of transfer.  Name of family member notified:        PHYSICIAN       Additional Comment:    _______________________________________________ Haig ProphetMorgan, Ocie Stanzione G, LCSW 06/04/2016, 11:26  AM

## 2016-06-04 NOTE — Consult Note (Signed)
Cheryl [name] is a 65 y.o. female  admitted with pneumonia. She's develop some nausea and vomiting. Single view abdominal x-rSueanne [name] demonstrated isolated small bowel loop which was distended suggestive of ileus versus obstruction. Surgery was consulted.  HPI: Patient has history of significant chronic lung disease was admitted with productive cough and imaging evidence consistent with pneumonia. She's been on antibiotics and submental oxygen. Last night and this morning she's had no episodes of vomiting. She denies any abdominal discomfort. She is moving her bowels normally and passing gas without difficulty. She did not have any significant change in her electrolytes other than some mild hypokalemia. She does not have any elevation her white blood cell count. A supine abdominal x-ray does demonstrate 1 large dilated loop of small bowel. There are no air-fluid levels and there is some bowel gas in the distal colon. She's had multiple previous abdominal surgeries. Surgical service was consulted for possible small bowel obstruction versus ileus.  Past Medical History  Diagnosis Date  . COPD (chronic obstructive pulmonary disease) (HCC)   . Hypertension   . Arthritis   . Hyperlipidemia   . GERD (gastroesophageal reflux disease)   . Neuropathy (HCC) 2010  . Oxygen deficiency   . Coronary artery disease   . Headache   . Anxiety   . Peripheral vascular disease (HCC)   . Vitamin D deficiency   . Migraines   . Chronic pain   . DVT (deep venous thrombosis) (HCC)   . Pneumonia   . Osteoporosis   . Allergy   . Asthma   . Aspiration pneumonitis (HCC) 11/24/2015  . Pneumonia 11/19/2015  . CHF (congestive heart failure) [facility](HCC)    Past Surgical History  Procedure Laterality Date  . Appendectomy    . Spine surgery    . Foot surgery Bilateral     5-6 years per patient  . Cardiac catherization  10/31/2009  . Abdomnal aortic stent  05/30/2008    Dr. Nanetta BattyJonathan [name]  . Abdominal hysterectomy  1975    Bilaterl Oophorectomy; Dur to IUD infection  . Cholecystectomy  1972  . Cervical fusion  C5 - 6/C6-7  . Appendectomy    . Colonoscopy with propofol N/A 07/27/2015    Procedure: COLONOSCOPY WITH PROPOFOL;  Surgeon: Wallace CullensPaul Y Oh, MD;  [facility];  Service: Gastroenterology;  Laterality: N/A;  . Esophagogastroduodenoscopy (egd) with propofol N/A 07/27/2015    Procedure: ESOPHAGOGASTRODUODENOSCOPY (EGD) WITH PROPOFOL;  Surgeon: Wallace CullensPaul Y Oh, MD;  [facility];  Service: Gastroenterology;  Laterality: N/A;   Social History   Social History  . Marital Status: Married    Spouse Name: N/A  . Number of Children: 6  . Years of Education: N/A   Occupational History  . Disabled    Social History Main Topics  . Smoking status: Current Every Day Smoker -- 0.50 packs/day for 50 years    Types: Cigarettes  . Smokeless tobacco: None  . Alcohol Use: No  . Drug Use: No  . Sexual Activity: Not Asked   Other Topics Concern  . None   Social History Narrative    Review of Systems: Review of Systems  Constitutional: Positive for weight loss and malaise/fatigue. Negative for fever and chills.  HENT: Negative.   Eyes: Negative.   Respiratory: Positive for cough, sputum production and shortness of breath.   Cardiovascular: Negative for chest pain and leg swelling.  Gastrointestinal: Positive for nausea and vomiting. Negative for heartburn and abdominal pain.  Genitourinary: Negative.  Musculoskeletal: Positive for myalgias, back pain and joint pain.  Skin: Negative.   Neurological: Positive for weakness.  Psychiatric/Behavioral: Negative.     PHYSICAL EXAM: BP 127/54 mmHg  Pulse 72  Temp(Src) 97.6 F (36.4 C) (Axillary)  Resp 18  Ht  (1.549 m)  Wt 45.36 kg (100 lb)  BMI 18.90 kg/m2  SpO2 95%  Physical Exam  Constitutional: She is oriented to person, place, and time.  Cachectic appearing elderly woman  HENT:  Head: Normocephalic and atraumatic.  Eyes: EOM  are normal. Pupils are equal, round, and reactive to light.  Neck: Normal range of motion. Neck supple.  Cardiovascular: Normal rate and regular rhythm.   Pulmonary/Chest: Effort normal. No respiratory distress. She has no wheezes. She has rales. She exhibits no tenderness.  Abdominal: Soft. Bowel sounds are normal. She exhibits no distension. There is no tenderness. There is no rebound and no guarding.  Musculoskeletal: Normal range of motion. She exhibits no edema or tenderness.  Neurological: She is alert and oriented to person, place, and time.  Skin: Skin is warm and dry.  Psychiatric: She has a normal mood and affect. Thought content normal.   Her abdomen is soft with no significant abdominal distention and no abdominal tenderness. She has active bowel sounds. She has no rebound or guarding.  Impression/Plan: I have independently reviewed her plain films. With her admitting diagnosis of pneumonia and long-standing chronic obstructive lung disease ileus would be the most likely diagnosis in this situation. She's not vomited significantly this afternoon. I would cautiously start her back on clear liquids. I would replete her potassium in order to eliminate any potential motility issues. I do not see any indication for further intervention at this time. If her symptoms persist, I would recommend a flat and upright abdominal x-rays and possible abdominal CT scan. We will continue to follow her while she is hospitalized.   Tiney Rouge III, MD  06/04/2016, 5:02 PM

## 2016-06-04 NOTE — Consult Note (Signed)
   [facility]   06/04/2016  Cheryl [name] 1951/06/19 [id number]   Went by to visit patient related to patient is currently active with [facility] for chronic disease management services.  Patient has been engaged by a [employer] Health Coach and previously by [employer] RN and SW.  Our community based plan of care has focused on disease management and community resource support. The RN Health coach will make [facility] SW aware patient's discharge plans as of now are [facility] STR. [facility] social worker will colaborate with the patient and facility at [facility]. Patient will receive a post facility discharge transition of care calls and will be evaluated for monthly home visits for assessments and disease process education. Of note, [facility] Care [facility] services does not replace or interfere with any services that are needed or arranged by inpatient case management or social work.  For additional questions or referrals please contact:  Siham Bucaro RN, BSN [facility]  Hospital Liaison  [phone]) Business Mobile [phone]) Toll free office

## 2016-06-04 NOTE — Progress Notes (Signed)
[employer] [facility] [place] [facility]   PATIENT NAME: Cheryl [name]    MR#:  [id number]  DATE OF BIRTH:  [birth year]  SUBJECTIVE:  CHIEF COMPLAINT:   Chief Complaint  Patient presents with  . Code Sepsis  Much more awake, less sob and coughing, hypoxic REVIEW OF SYSTEMS:  Review of Systems  Constitutional: Positive for weight loss. Negative for fever, malaise/fatigue and diaphoresis.  HENT: Negative for ear discharge, ear pain, hearing loss, nosebleeds, sore throat and tinnitus.   Eyes: Negative for blurred vision and pain.  Respiratory: Positive for cough and shortness of breath. Negative for hemoptysis and wheezing.   Cardiovascular: Negative for chest pain, palpitations, orthopnea and leg swelling.  Gastrointestinal: Negative for heartburn, nausea, vomiting, abdominal pain, diarrhea, constipation and blood in stool.  Genitourinary: Negative for dysuria, urgency and frequency.  Musculoskeletal: Positive for falls. Negative for myalgias and back pain.  Skin: Negative for itching and rash.  Neurological: Positive for weakness. Negative for dizziness, tingling, tremors, focal weakness, seizures and headaches.  Psychiatric/Behavioral: Negative for depression. The patient is not nervous/anxious.     DRUG ALLERGIES:   Allergies  Allergen Reactions  . Percocet [Oxycodone-Acetaminophen] Hives and Rash  . Aspirin Nausea And Vomiting and Other (See Comments)    Reaction:  GI upset   . Codeine Nausea And Vomiting and Other (See Comments)    Reaction:  GI upset   . Morphine Other (See Comments)    Reaction:  Unknown patient states she had a reaction one time but has not had a reaction since when given morphine.   . Propoxyphene Other (See Comments) and Nausea Only    GI upset Reaction:  GI upset   . Sulfa Antibiotics Rash and Other (See Comments)    Reaction:  GI upset    VITALS:  Blood pressure 175/74, pulse 88, temperature 98 F (36.7 C), temperature source Oral,  resp. rate 20, height 5\' 1"  (1.549 m), weight 45.36 kg (100 lb), SpO2 94 %. PHYSICAL EXAMINATION:  Physical Exam  Constitutional: She is oriented to person, place, and time. She appears malnourished. She appears cachectic.  HENT:  Head: Normocephalic and atraumatic.  Eyes: Conjunctivae and EOM are normal. Pupils are equal, round, and reactive to light.  Neck: Normal range of motion. Neck supple. No tracheal deviation present. No thyromegaly present.  Cardiovascular: Normal rate, regular rhythm and normal heart sounds.   Pulmonary/Chest: No accessory muscle usage. No respiratory distress. She has no decreased breath sounds. She has wheezes in the right lower field and the left lower field. She exhibits no tenderness.  Abdominal: Soft. Bowel sounds are normal. She exhibits no distension. There is no tenderness.  Musculoskeletal: Normal range of motion.  Neurological: She is alert and oriented to person, place, and time. No cranial nerve deficit.  Skin: Skin is warm and dry. No rash noted.  Psychiatric: Mood and affect normal.   LABORATORY PANEL:   CBC  Recent Labs Lab 06/03/16 0321  WBC 10.3  HGB 12.2  HCT 35.4  PLT 175   ------------------------------------------------------------------------------------------------------------------ Chemistries   Recent Labs Lab 06/02/16 2209  06/04/16 0531  NA 135  < > 137  K 3.6  < > 3.2*  CL 98*  < > 99*  CO2 23  < > 25  GLUCOSE 52*  < > 99  BUN 14  < > 16  CREATININE 0.79  < > 0.69  CALCIUM 8.4*  < > 8.5*  AST 28  --   --  ALT 14  --   --   ALKPHOS 121  --   --   BILITOT 1.2  --   --   < > = values in this interval not displayed. RADIOLOGY:  No results found. ASSESSMENT AND PLAN:  * Bronchopulmonary pneumonia - continue Oxygen, nebs, IV antibiotics  -Solu-Medrol 40 mg IV  -Blood cultures neg  * COPD -Solu-Medrol, Nebs - wean O2 as tolerated - pulmo rehab as an outpt  * Hypokalemia - replete and recheck  Chronic  respiratory failure -Oxygen to keep sats greater than 88% - will need 2 liters o2 on D/C  Tobacco abuse -Nicotine patch, duonebs as needed  History of dysphagia -Aspiration precautions  Mild dementia  -Stable, home medications resumed   Fall risk -Aware  Coronary artery disease -Stable, home medications resumed   PT recommends STR/SNF - SW aware and working on it.   All the records are reviewed and case discussed with Care Management/Social Worker. Management plans discussed with the patient, family (d/w husband at bedside) and they are in agreement.  CODE STATUS: full code  TOTAL TIME TAKING CARE OF THIS PATIENT: 35 minutes.   More than 50% of the time was spent in counseling/coordination of care: YES  POSSIBLE D/C IN am, DEPENDING ON CLINICAL CONDITION.   [facility], Corion Sherrod M.D on 06/04/2016 at 9:01 AM  Between 7am to 6pm - Pager - [phone]  After 6pm go to [email or web address] - Social research officer, government  [employer] [facility] [place] [facility]  Office  [phone]  CC: Primary care physician; Mila Merry, MD  Note: This dictation was prepared with Dragon dictation along with smaller phrase technology. Any transcriptional errors that result from this process are unintentional.

## 2016-06-04 NOTE — Progress Notes (Signed)
Dr. Sherryll BurgerShah made aware of the abdomen xray. Dr. Sherryll BurgerShah stated to make patient npo except meds.

## 2016-06-04 NOTE — Progress Notes (Signed)
Dr. Sherryll BurgerShah notified of patient's intolerance to anything po. Stated will put in orders to for iv medications and abdomen xray.

## 2016-06-05 DIAGNOSIS — I2510 Atherosclerotic heart disease of native coronary artery without angina pectoris: Secondary | ICD-10-CM | POA: Diagnosis not present

## 2016-06-05 DIAGNOSIS — F419 Anxiety disorder, unspecified: Secondary | ICD-10-CM | POA: Diagnosis not present

## 2016-06-05 DIAGNOSIS — J449 Chronic obstructive pulmonary disease, unspecified: Secondary | ICD-10-CM | POA: Diagnosis not present

## 2016-06-05 DIAGNOSIS — K219 Gastro-esophageal reflux disease without esophagitis: Secondary | ICD-10-CM | POA: Diagnosis not present

## 2016-06-05 DIAGNOSIS — E785 Hyperlipidemia, unspecified: Secondary | ICD-10-CM | POA: Diagnosis not present

## 2016-06-05 DIAGNOSIS — J9610 Chronic respiratory failure, unspecified whether with hypoxia or hypercapnia: Secondary | ICD-10-CM | POA: Diagnosis not present

## 2016-06-05 DIAGNOSIS — R1312 Dysphagia, oropharyngeal phase: Secondary | ICD-10-CM | POA: Diagnosis not present

## 2016-06-05 DIAGNOSIS — J440 Chronic obstructive pulmonary disease with acute lower respiratory infection: Secondary | ICD-10-CM | POA: Diagnosis not present

## 2016-06-05 DIAGNOSIS — G309 Alzheimer's disease, unspecified: Secondary | ICD-10-CM | POA: Diagnosis not present

## 2016-06-05 DIAGNOSIS — M6281 Muscle weakness (generalized): Secondary | ICD-10-CM | POA: Diagnosis not present

## 2016-06-05 DIAGNOSIS — R509 Fever, unspecified: Secondary | ICD-10-CM | POA: Diagnosis not present

## 2016-06-05 DIAGNOSIS — J189 Pneumonia, unspecified organism: Secondary | ICD-10-CM | POA: Diagnosis not present

## 2016-06-05 DIAGNOSIS — F0390 Unspecified dementia without behavioral disturbance: Secondary | ICD-10-CM | POA: Diagnosis not present

## 2016-06-05 DIAGNOSIS — I10 Essential (primary) hypertension: Secondary | ICD-10-CM | POA: Diagnosis not present

## 2016-06-05 DIAGNOSIS — R2681 Unsteadiness on feet: Secondary | ICD-10-CM | POA: Diagnosis not present

## 2016-06-05 LAB — CBC
HCT: 34.7 % — ABNORMAL LOW (ref 35.0–47.0)
HEMOGLOBIN: 11.9 g/dL — AB (ref 12.0–16.0)
MCH: 29.8 pg (ref 26.0–34.0)
MCHC: 34.3 g/dL (ref 32.0–36.0)
MCV: 86.8 fL (ref 80.0–100.0)
PLATELETS: 252 10*3/uL (ref 150–440)
RBC: 4 MIL/uL (ref 3.80–5.20)
RDW: 18.7 % — ABNORMAL HIGH (ref 11.5–14.5)
WBC: 11 10*3/uL (ref 3.6–11.0)

## 2016-06-05 LAB — BASIC METABOLIC PANEL
ANION GAP: 12 (ref 5–15)
BUN: 25 mg/dL — ABNORMAL HIGH (ref 6–20)
CALCIUM: 8.9 mg/dL (ref 8.9–10.3)
CO2: 27 mmol/L (ref 22–32)
Chloride: 99 mmol/L — ABNORMAL LOW (ref 101–111)
Creatinine, Ser: 0.68 mg/dL (ref 0.44–1.00)
Glucose, Bld: 103 mg/dL — ABNORMAL HIGH (ref 65–99)
POTASSIUM: 4 mmol/L (ref 3.5–5.1)
Sodium: 138 mmol/L (ref 135–145)

## 2016-06-05 MED ORDER — PREDNISONE 10 MG (21) PO TBPK: 10.0000 mg | ORAL_TABLET | Freq: Every day | ORAL | Status: DC

## 2016-06-05 MED ORDER — FENTANYL 50 MCG/HR TD PT72: 50.0000 ug | MEDICATED_PATCH | TRANSDERMAL | Status: DC

## 2016-06-05 MED ORDER — LEVOFLOXACIN 500 MG PO TABS: 500.0000 mg | ORAL_TABLET | Freq: Every day | ORAL | Status: DC

## 2016-06-05 MED ORDER — TRAMADOL HCL 50 MG PO TABS: 100.0000 mg | ORAL_TABLET | Freq: Four times a day (QID) | ORAL | Status: DC | PRN

## 2016-06-05 MED ORDER — ALPRAZOLAM 0.25 MG PO TABS: 0.2500 mg | ORAL_TABLET | Freq: Three times a day (TID) | ORAL | Status: DC | PRN

## 2016-06-05 NOTE — Discharge Summary (Signed)
[facility] - [place] at [facility]   PATIENT NAME: Cheryl [name]    MR#:  [id number]  DATE OF BIRTH:  07-Jan-1951  DATE OF ADMISSION:  06/02/2016 ADMITTING PHYSICIAN: Gery Pray, MD  DATE OF DISCHARGE: 06/05/2016  PRIMARY CARE PHYSICIAN: Mila Merry, MD    ADMISSION DIAGNOSIS:  Hypoglycemia [E16.2] Hypoxia [R09.02] Fever, unspecified fever cause [R50.9] Altered mental status, unspecified altered mental status type [R41.82]  DISCHARGE DIAGNOSIS:  Active Problems:   Pneumonia   Nausea with vomiting  SECONDARY DIAGNOSIS:   Past Medical History  Diagnosis Date  . COPD (chronic obstructive pulmonary disease) (HCC)   . Hypertension   . Arthritis   . Hyperlipidemia   . GERD (gastroesophageal reflux disease)   . Neuropathy (HCC) 2010  . Oxygen deficiency   . Coronary artery disease   . Headache   . Anxiety   . Peripheral vascular disease (HCC)   . Vitamin D deficiency   . Migraines   . Chronic pain   . DVT (deep venous thrombosis) (HCC)   . Pneumonia   . Osteoporosis   . Allergy   . Asthma   . Aspiration pneumonitis (HCC) 11/24/2015  . Pneumonia 11/19/2015  . CHF (congestive heart failure) (HCC)     HOSPITAL COURSE:  * Bronchopulmonary pneumonia - Treated with Abx  * COPD - improving with steroids  * Hypokalemia - replete and resolved  Chronic respiratory failure As PRN O2.  Tobacco abuse -Nicotine patch, duonebs as needed  History of dysphagia -Aspiration precautions  Mild dementia  -Stable, home medications resumed  ileus - Resolved DISCHARGE CONDITIONS:   stable  CONSULTS OBTAINED:  Treatment Team:  Tiney Rouge III, MD  DRUG ALLERGIES:   Allergies  Allergen Reactions  . Percocet [Oxycodone-Acetaminophen] Hives and Rash  . Aspirin Nausea And Vomiting and Other (See Comments)    Reaction:  GI upset   . Codeine Nausea And Vomiting and Other (See Comments)    Reaction:  GI upset   . Morphine Other (See  Comments)    Reaction:  Unknown patient states she had a reaction one time but has not had a reaction since when given morphine.   . Propoxyphene Other (See Comments) and Nausea Only    GI upset Reaction:  GI upset   . Sulfa Antibiotics Rash and Other (See Comments)    Reaction:  GI upset     DISCHARGE MEDICATIONS:   Current Discharge Medication List    START taking these medications   Details  levofloxacin (LEVAQUIN) 500 MG tablet Take 1 tablet (500 mg total) by mouth daily. Qty: 3 tablet, Refills: 0    predniSONE (STERAPRED UNI-PAK 21 TAB) 10 MG (21) TBPK tablet Take 1 tablet (10 mg total) by mouth daily. Start 60 mg po daily, taper 10 mg daily until done. Qty: 21 tablet, Refills: 0      CONTINUE these medications which have CHANGED   Details  ALPRAZolam (XANAX) 0.25 MG tablet Take 1 tablet (0.25 mg total) by mouth 3 (three) times daily as needed for anxiety. Qty: 30 tablet, Refills: 0    fentaNYL (DURAGESIC - DOSED MCG/HR) 50 MCG/HR Place 1 patch (50 mcg total) onto the skin every 3 (three) days. Qty: 2 patch, Refills: 0   Associated Diagnoses: Chronic pain      CONTINUE these medications which have NOT CHANGED   Details  acetaminophen (TYLENOL) 500 MG tablet Take 1,000 mg by mouth every 6 (six) hours as needed for mild  pain or headache.     albuterol (PROVENTIL HFA;VENTOLIN HFA) 108 (90 BASE) MCG/ACT inhaler Inhale 2 puffs into the lungs every 4 (four) hours as needed for wheezing or shortness of breath. Qty: 18 g, Refills: 3    albuterol (PROVENTIL) (2.5 MG/3ML) 0.083% nebulizer solution Take 3 mLs (2.5 mg total) by nebulization every 4 (four) hours as needed for wheezing. Qty: 75 mL, Refills: 12    clopidogrel (PLAVIX) 75 MG tablet Take 75 mg by mouth daily.    cyclobenzaprine (FLEXERIL) 5 MG tablet take 1-2 tablets by mouth every 8 hours if needed Qty: 30 tablet, Refills: 3    diphenoxylate-atropine (LOMOTIL) 2.5-0.025 MG per tablet Take 2 tablets by mouth 4  (four) times daily as needed for diarrhea or loose stools. Reported on 03/11/2016    donepezil (ARICEPT) 10 MG tablet Take 10 mg by mouth at bedtime.    estropipate (OGEN) 0.75 MG tablet Take 1 tablet (0.75 mg total) by mouth daily. Qty: 30 tablet, Refills: 6    fluticasone (FLONASE) 50 MCG/ACT nasal spray Place 2 sprays into both nostrils daily. Qty: 16 g, Refills: 1    Fluticasone-Salmeterol (ADVAIR) 250-50 MCG/DOSE AEPB Inhale 1 puff into the lungs 2 (two) times daily.    furosemide (LASIX) 40 MG tablet Take 40 mg by mouth daily.    pantoprazole (PROTONIX) 40 MG tablet Take 40 mg by mouth daily.     pregabalin (LYRICA) 150 MG capsule Take 1 capsule (150 mg total) by mouth every 8 (eight) hours. Qty: 90 capsule, Refills: 2   Associated Diagnoses: Fibromyalgia    simvastatin (ZOCOR) 10 MG tablet Take 10 mg by mouth at bedtime. Reported on 12/04/2015    SUMAtriptan (IMITREX) 25 MG tablet Take 25 mg by mouth as needed for migraine. May repeat in 2 hours if headache persists or recurs.    tiotropium (SPIRIVA) 18 MCG inhalation capsule Place 1 capsule (18 mcg total) into inhaler and inhale daily. Qty: 30 capsule, Refills: 12    traMADol (ULTRAM) 50 MG tablet Take 2 tablets (100 mg total) by mouth every 6 (six) hours as needed for severe pain. Qty: 240 tablet, Refills: 2   Associated Diagnoses: Chronic pain         DISCHARGE INSTRUCTIONS:    DIET:  Regular diet  DISCHARGE CONDITION:  Good  ACTIVITY:  Activity as tolerated  OXYGEN:  Home Oxygen: No.   Oxygen Delivery: room air  DISCHARGE LOCATION:  home   If you experience worsening of your admission symptoms, develop shortness of breath, life threatening emergency, suicidal or homicidal thoughts you must seek medical attention immediately by calling 911 or calling your MD immediately  if symptoms less severe.  You Must read complete instructions/literature along with all the possible adverse reactions/side effects  for all the Medicines you take and that have been prescribed to you. Take any new Medicines after you have completely understood and accpet all the possible adverse reactions/side effects.   Please note  You were cared for by a hospitalist during your hospital stay. If you have any questions about your discharge medications or the care you received while you were in the hospital after you are discharged, you can call the unit and asked to speak with the hospitalist on call if the hospitalist that took care of you is not available. Once you are discharged, your primary care physician will handle any further medical issues. Please note that NO REFILLS for any discharge medications will be authorized once you  are discharged, as it is imperative that you return to your primary care physician (or establish a relationship with a primary care physician if you do not have one) for your aftercare needs so that they can reassess your need for medications and monitor your lab values.    On the day of Discharge:  VITAL SIGNS:  Blood pressure 150/57, pulse 82, temperature 98.8 F (37.1 C), temperature source Oral, resp. rate 22, height  (1.549 m), weight 45.36 kg (100 lb), SpO2 94 %. PHYSICAL EXAMINATION:  GENERAL:  65 y.o.-year-old patient lying in the bed with no acute distress.  EYES: Pupils equal, round, reactive to light and accommodation. No scleral icterus. Extraocular muscles intact.  HEENT: Head atraumatic, normocephalic. Oropharynx and nasopharynx clear.  NECK:  Supple, no jugular venous distention. No thyroid enlargement, no tenderness.  LUNGS: Normal breath sounds bilaterally, no wheezing, rales,rhonchi or crepitation. No use of accessory muscles of respiration.  CARDIOVASCULAR: S1, S2 normal. No murmurs, rubs, or gallops.  ABDOMEN: Soft, non-tender, non-distended. Bowel sounds present. No organomegaly or mass.  EXTREMITIES: No pedal edema, cyanosis, or clubbing.  NEUROLOGIC: Cranial nerves  II through XII are intact. Muscle strength 5/5 in all extremities. Sensation intact. Gait not checked.  PSYCHIATRIC: The patient is alert and oriented x 3.  SKIN: No obvious rash, lesion, or ulcer.  DATA REVIEW:   CBC  Recent Labs Lab 06/05/16 0529  WBC 11.0  HGB 11.9*  HCT 34.7*  PLT 252    Chemistries   Recent Labs Lab 06/02/16 2209  06/04/16 0531 06/05/16 0529  NA 135  < > 137 138  K 3.6  < > 3.2* 4.0  CL 98*  < > 99* 99*  CO2 23  < > 25 27  GLUCOSE 52*  < > 99 103*  BUN 14  < > 16 25*  CREATININE 0.79  < > 0.69 0.68  CALCIUM 8.4*  < > 8.5* 8.9  MG  --   --  1.9  --   AST 28  --   --   --   ALT 14  --   --   --   ALKPHOS 121  --   --   --   BILITOT 1.2  --   --   --   < > = values in this interval not displayed.   RADIOLOGY:  Dg Abd 1 View  06/04/2016  CLINICAL DATA:  Diffuse abdominal pain and vomiting beginning this morning. EXAM: ABDOMEN - 1 VIEW COMPARISON:  CT abdomen and pelvis 10/09/2015. FINDINGS: No free intraperitoneal air is identified. Dilated loop of small bowel is seen in the right mid abdomen. Little to no gas in the colon is identified. Stent in the descending abdominal aorta is seen. Convex left scoliosis is noted. Degenerative change is present about the right hip. IMPRESSION: Single dilated loop of small bowel in the right mid abdomen could be due to focal ileus. Obstruction is also possible. Negative for free intraperitoneal air. Electronically Signed   By: Drusilla Kanner M.D.   On: 06/04/2016 10:45     Management plans discussed with the patient, family and they are in agreement.  CODE STATUS: FULL CODE  TOTAL TIME TAKING CARE OF THIS PATIENT: 45 minutes.    [facility] M.D on 06/05/2016 at 3:28 PM  Between 7am to 6pm - Pager - [phone]  After 6pm go to [email or web address] - password [email or web address] [facility]  [place]  [facility]  Office  [phone]  CC: Primary care  physician; Mila Merryonald Fisher, MD   Note: This dictation was prepared with  Dragon dictation along with smaller phrase technology. Any transcriptional errors that result from this process are unintentional.

## 2016-06-05 NOTE — Progress Notes (Signed)
PHARMACY CONSULT NOTE  Pharmacy Consult for Electrolyte Monitoring and Replacement  Patient Measurements: Height: 5\' 1"  (154.9 cm) Weight: 100 lb (45.36 kg) IBW/kg (Calculated) : 47.8   Recent Labs  06/02/16 2209 06/03/16 0321 06/05/16 0529  WBC 11.9* 10.3 11.0  HGB 11.8* 12.2 11.9*  HCT 35.7 35.4 34.7*  PLT 210 175 252    Recent Labs  06/02/16 2209 06/03/16 0321 06/04/16 0531 06/05/16 0529  NA 135 135 137 138  K 3.6 3.3* 3.2* 4.0  CL 98* 103 99* 99*  CO2 23 23 25 27   GLUCOSE 52* 124* 99 103*  BUN 14 11 16  25*  CREATININE 0.79 0.77 0.69 0.68  CALCIUM 8.4* 7.8* 8.5* 8.9  MG  --   --  1.9  --   PHOS  --   --  2.5  --   PROT 6.7  --   --   --   ALBUMIN 3.3*  --   --   --   AST 28  --   --   --   ALT 14  --   --   --   ALKPHOS 121  --   --   --   BILITOT 1.2  --   --   --    Estimated Creatinine Clearance: 48.9 mL/min (by C-G formula based on Cr of 0.68).    Recent Labs  06/02/16 2338  GLUCAP 191*   Assessment: 65 yo female with PNA and now illeus. Pharmacy consulted for electrolyte monitoring and replacement.   Currently on 0.45% NS with 40mEq KCl at 1250ml/hr  Plan:  Electroltyes WNL today, no additional supplementation required.  Garlon HatchetJody Gracey Tolle, PharmD Clinical Pharmacist  06/05/2016 8:40 AM

## 2016-06-05 NOTE — Clinical Social Work Placement (Signed)
   CLINICAL SOCIAL WORK PLACEMENT  NOTE  Date:  06/05/2016  Patient Details  Name: Cheryl [name] MRN: [id number] Date of Birth: 1951/06/07  Clinical Social Work is seeking post-discharge placement for this patient at the Skilled  Nursing Facility level of care (*CSW will initial, date and re-position this form in  chart as items are completed):  Yes   Patient/family provided with [place] Clinical Social Work Department's list of facilities offering this level of care within the geographic area requested by the patient (or if unable, by the patient's family).  Yes   Patient/family informed of their freedom to choose among providers that offer the needed level of care, that participate in Medicare, Medicaid or managed care program needed by the patient, have an available bed and are willing to accept the patient.  Yes   Patient/family informed of [facility]'s ownership interest in [facility] and [facility], as well as of the fact that they are under no obligation to receive care at these facilities.  PASRR submitted to EDS on       PASRR number received on       Existing PASRR number confirmed on 06/03/16     FL2 transmitted to all facilities in geographic area requested by pt/family on 06/03/16     FL2 transmitted to all facilities within larger geographic area on       Patient informed that his/her managed care company has contracts with or will negotiate with certain facilities, including the following:        Yes   Patient/family informed of bed offers received.  Patient chooses bed at  [facility] )     Physician recommends and patient chooses bed at      Patient to be transferred to  [facility] ) on 06/05/16.  Patient to be transferred to facility by  (Patient's [place] Cheryl [name] [facility] )     Patient family notified on 06/05/16 of transfer.  Name of family member notified:   (Patient's husband Cheryl [name] is aware of D/C today. )      PHYSICIAN       Additional Comment:    _______________________________________________ Haig ProphetMorgan, Ensley Blas G, LCSW 06/05/2016, 4:36 PM

## 2016-06-05 NOTE — Care Management Important Message (Signed)
Important Message  Patient Details  Name: Cheryl [name] MRN: [id number] Date of Birth: 1951-10-23   Medicare Important Message Given:  Yes    Olegario MessierKathy A Janssen Zee 06/05/2016, 1:57 PM

## 2016-06-05 NOTE — Discharge Instructions (Signed)

## 2016-06-05 NOTE — Progress Notes (Signed)
Patient discharging to [facility]. Report called to facility. Son will be transporting patient to [facility].

## 2016-06-05 NOTE — Progress Notes (Signed)
Patient is medically stable for D/C to [facility] today. Per Varney Baasick [facility] admissions coordinator patient will go to room E-17. RN will call report and patient's husband [name] will provide transport. [facility] authorization has been received. Auth # [id number]. Clinical Child psychotherapistocial Worker (CSW) sent D/C Summary, FL2 and D/C Packet to [employer] via [employer]. Patient is aware of above. Patient's husband [name] is at bedside and aware of above. Please reconsult if future social work needs arise. CSW signing off.   Jetta LoutBailey Morgan, LCSW [phone]

## 2016-06-05 NOTE — Progress Notes (Signed)
Physical Therapy Treatment Patient Details Name: Cheryl [name] MRN: [id number] DOB: 07-Jun-1951 Today's Date: 06/05/2016    History of Present Illness presented to ER secondary to progressive cough, fever; admitted with multi-focal bronchopulmonary pneumonia.  Currently on 3L supplemental, weaned per orders to 1L during session (maintaining sats >90-91% on 1L with exertion)    PT Comments    Progressive increase in activity tolerance and functional performance (tolerating gait up to 15-20'), but remains generally fatigued with marked deficits in cardiorespiratory endurance.  Unsteady with all gait efforts, requiring use of RW and +1 for optimal safety.  Sats 88% RA throughout session.  Follow Up Recommendations  SNF     Equipment Recommendations       Recommendations for Other Services       Precautions / Restrictions Precautions Precautions: Fall Restrictions Weight Bearing Restrictions: No    Mobility  Bed Mobility Overal bed mobility: Needs Assistance Bed Mobility: Supine to Sit     Supine to sit: Supervision     General bed mobility comments: use of arms on bedrails to assist with movement transition  Transfers Overall transfer level: Needs assistance Equipment used: Rolling walker (2 wheeled) Transfers: Sit to/from Stand Sit to Stand: Min assist         General transfer comment: requires UE support to complete movement transition  Ambulation/Gait Ambulation/Gait assistance: Min assist Ambulation Distance (Feet): 15 Feet Assistive device: Rolling walker (2 wheeled)       General Gait Details: narrowed BOS with inconsistent step length/height, forward flexed posture; lateral instability with turns and obstacle negotiation requiring min assist to correct.  significant fatigue/SOB with minimal exertion   Stairs            Wheelchair Mobility    Modified Rankin (Stroke Patients Only)       Balance Overall balance assessment: Needs  assistance Sitting-balance support: No upper extremity supported;Feet supported Sitting balance-Leahy Scale: Good     Standing balance support: Bilateral upper extremity supported Standing balance-Leahy Scale: Fair                      Cognition                            Exercises Other Exercises Other Exercises: Seated LE therex, 1x15, AROM for LE strength/endurance with functional activities Other Exercises: Toilet transfer, ambulatory with RW, min assist; sit/stand from standard toilet with RW, min assist; standing balance for clothing management, min assist.  Functional reach outside immediate BOS approx 2-3", requiring contralateral UE support for external stabilization.    General Comments        Pertinent Vitals/Pain Pain Assessment: No/denies pain    Home Living                      Prior Function            PT Goals (current goals can now be found in the care plan section) Acute Rehab PT Goals Patient Stated Goal: to cover up-I'm cold PT Goal Formulation: With patient Time For Goal Achievement: 06/17/16 Potential to Achieve Goals: Fair Progress towards PT goals: Progressing toward goals    Frequency  Min 2X/week    PT Plan Current plan remains appropriate    Co-evaluation             End of Session Equipment Utilized During Treatment: Gait belt Activity Tolerance: Patient limited by fatigue Patient  left: in chair;with call bell/phone within reach;with chair alarm set;with family/visitor present     Time: 1027-2536 PT Time Calculation (min) (ACUTE ONLY): 20 min  Charges:  $Gait Training: 8-22 mins                    G Codes:      Kazimir Hartnett H. Manson Passey, PT, DPT, NCS 06/05/2016, 11:22 AM [phone]

## 2016-06-06 ENCOUNTER — Other Ambulatory Visit: Payer: Self-pay | Admitting: *Deleted

## 2016-06-06 DIAGNOSIS — J441 Chronic obstructive pulmonary disease with (acute) exacerbation: Secondary | ICD-10-CM

## 2016-06-06 NOTE — Patient Outreach (Signed)
[facility] Care Management  06/06/2016  Cheryl MargaritaMary George [name] Mar 26, 1951 [id number]  Referral received from Janci Minor that this patient was inpatient on 07/09 and [facility].  Per Ma RingsJanci please refer to Child psychotherapistsocial worker.   Gean MaidensFrances Jesscia Imm BSN RN [facility] Management [phone]

## 2016-06-07 LAB — CULTURE, BLOOD (ROUTINE X 2)
Culture: NO GROWTH
Culture: NO GROWTH

## 2016-06-11 DIAGNOSIS — I2510 Atherosclerotic heart disease of native coronary artery without angina pectoris: Secondary | ICD-10-CM | POA: Diagnosis not present

## 2016-06-11 DIAGNOSIS — M6281 Muscle weakness (generalized): Secondary | ICD-10-CM | POA: Diagnosis not present

## 2016-06-11 DIAGNOSIS — J440 Chronic obstructive pulmonary disease with acute lower respiratory infection: Secondary | ICD-10-CM | POA: Diagnosis not present

## 2016-06-11 DIAGNOSIS — E785 Hyperlipidemia, unspecified: Secondary | ICD-10-CM | POA: Diagnosis not present

## 2016-06-11 DIAGNOSIS — F419 Anxiety disorder, unspecified: Secondary | ICD-10-CM | POA: Diagnosis not present

## 2016-06-11 DIAGNOSIS — I10 Essential (primary) hypertension: Secondary | ICD-10-CM | POA: Diagnosis not present

## 2016-06-11 DIAGNOSIS — G309 Alzheimer's disease, unspecified: Secondary | ICD-10-CM | POA: Diagnosis not present

## 2016-06-11 DIAGNOSIS — K219 Gastro-esophageal reflux disease without esophagitis: Secondary | ICD-10-CM | POA: Diagnosis not present

## 2016-06-14 ENCOUNTER — Other Ambulatory Visit: Payer: Self-pay | Admitting: *Deleted

## 2016-06-14 DIAGNOSIS — J441 Chronic obstructive pulmonary disease with (acute) exacerbation: Secondary | ICD-10-CM

## 2016-06-14 NOTE — Patient Outreach (Signed)
[facility]) Care Management  [facility] Social Work  06/14/2016  Cheryl [name] [facility] [birth year] [id number]  Subjective:  This Education officer, museum visited patient at [facility].  Patient dischaging home today.  Husband to transport patient home today.  Objective:   Encounter Medications:  Outpatient Encounter Prescriptions as of 06/14/2016  Medication Sig Note  . acetaminophen (TYLENOL) 500 MG tablet Take 1,000 mg by mouth every 6 (six) hours as needed for mild pain or headache.    . albuterol (PROVENTIL HFA;VENTOLIN HFA) 108 (90 BASE) MCG/ACT inhaler Inhale 2 puffs into the lungs every 4 (four) hours as needed for wheezing or shortness of breath.   Marland Kitchen albuterol (PROVENTIL) (2.5 MG/3ML) 0.083% nebulizer solution Take 3 mLs (2.5 mg total) by nebulization every 4 (four) hours as needed for wheezing.   Marland Kitchen ALPRAZolam (XANAX) 0.25 MG tablet Take 1 tablet (0.25 mg total) by mouth 3 (three) times daily as needed for anxiety.   . clopidogrel (PLAVIX) 75 MG tablet Take 75 mg by mouth daily. 04/23/2016: Stopped Plavix for procedure on  X 7 - 8 days ago.  . cyclobenzaprine (FLEXERIL) 5 MG tablet take 1-2 tablets by mouth every 8 hours if needed   . diphenoxylate-atropine (LOMOTIL) 2.5-0.025 MG per tablet Take 2 tablets by mouth 4 (four) times daily as needed for diarrhea or loose stools. Reported on 03/11/2016   . donepezil (ARICEPT) 10 MG tablet Take 10 mg by mouth at bedtime.   Marland Kitchen estropipate (OGEN) 0.75 MG tablet Take 1 tablet (0.75 mg total) by mouth daily.   . fentaNYL (DURAGESIC - DOSED MCG/HR) 50 MCG/HR Place 1 patch (50 mcg total) onto the skin every 3 (three) days.   . fluticasone (FLONASE) 50 MCG/ACT nasal spray Place 2 sprays into both nostrils daily.   . Fluticasone-Salmeterol (ADVAIR) 250-50 MCG/DOSE AEPB Inhale 1 puff into the lungs 2 (two) times daily.   . furosemide (LASIX) 40 MG tablet Take 40 mg by mouth daily.   Marland Kitchen levofloxacin (LEVAQUIN) 500 MG tablet Take 1 tablet (500 mg  total) by mouth daily.   . pantoprazole (PROTONIX) 40 MG tablet Take 40 mg by mouth daily.    . predniSONE (STERAPRED UNI-PAK 21 TAB) 10 MG (21) TBPK tablet Take 1 tablet (10 mg total) by mouth daily. Start 60 mg po daily, taper 10 mg daily until done.   . pregabalin (LYRICA) 150 MG capsule Take 1 capsule (150 mg total) by mouth every 8 (eight) hours.   . simvastatin (ZOCOR) 10 MG tablet Take 10 mg by mouth at bedtime. Reported on 12/04/2015   . SUMAtriptan (IMITREX) 25 MG tablet Take 25 mg by mouth as needed for migraine. May repeat in 2 hours if headache persists or recurs.   Marland Kitchen tiotropium (SPIRIVA) 18 MCG inhalation capsule Place 1 capsule (18 mcg total) into inhaler and inhale daily.   . traMADol (ULTRAM) 50 MG tablet Take 2 tablets (100 mg total) by mouth every 6 (six) hours as needed for severe pain.    Facility-Administered Encounter Medications as of 06/14/2016  Medication  . midazolam (VERSED) 5 MG/5ML injection 1-2 mg    Functional Status:  In your present state of health, do you have any difficulty performing the following activities: 06/14/2016 06/03/2016  Hearing? N N  Vision? N N  Difficulty concentrating or making decisions? Cheryl [name]  Walking or climbing stairs? Y Y  Dressing or bathing? Y Y  Doing errands, shopping? Cheryl [name]  Preparing Food and eating ? Y -  Using the Toilet? N -  In the past six months, have you accidently leaked urine? Y -  Do you have problems with loss of bowel control? Y -  Managing your Medications? Y -  Managing your Finances? Y -  Housekeeping or managing your Housekeeping? N -    Fall/Depression Screening:  PHQ 2/9 Scores 06/14/2016 05/06/2016 03/20/2016 03/11/2016 02/28/2016 01/05/2016 12/04/2015  PHQ - 2 Score 0 0 0 0 0 0 0    Assessment: Patient to discharge home today. Patient's spouse to transport patient home. Per discharge instructions, patient to follow up with primary doctor for chest xray in 2 weeks to make sure pneumonia has cleared up.   Patient  discharged with lomotil 2.89m to take two tablets by mouth four times a day as needed for diarrhea or loose stools.  Patient discharged with [facility] health PT [facility]  Met with social worker  Cheryl [name] who was able to provide this sEducation officer, museumwith copy of patient's discharge instructions.  Patient to discharge home with husband, positive family support described.  Plan:  This sEducation officer, museumto inform RNCM of patient's plan to discharge home today from the SNF for transition of care.  Patient verbalize no further social work needs, patient to be closed to social work at this time.     CSheralyn BoatmanTSt [facility] Management [phone]

## 2016-06-15 DIAGNOSIS — J449 Chronic obstructive pulmonary disease, unspecified: Secondary | ICD-10-CM | POA: Diagnosis not present

## 2016-06-15 DIAGNOSIS — N390 Urinary tract infection, site not specified: Secondary | ICD-10-CM | POA: Diagnosis not present

## 2016-06-15 DIAGNOSIS — G609 Hereditary and idiopathic neuropathy, unspecified: Secondary | ICD-10-CM | POA: Diagnosis not present

## 2016-06-15 DIAGNOSIS — R110 Nausea: Secondary | ICD-10-CM | POA: Diagnosis not present

## 2016-06-17 ENCOUNTER — Encounter: Payer: Self-pay | Admitting: *Deleted

## 2016-06-17 ENCOUNTER — Telehealth: Payer: Self-pay | Admitting: Family Medicine

## 2016-06-17 ENCOUNTER — Other Ambulatory Visit: Payer: Self-pay | Admitting: *Deleted

## 2016-06-17 ENCOUNTER — Telehealth: Payer: Self-pay

## 2016-06-17 DIAGNOSIS — R159 Full incontinence of feces: Secondary | ICD-10-CM

## 2016-06-17 NOTE — Telephone Encounter (Signed)
Patient's husband called, states patient needs to follow up with GI for bowel incontinence but they told patient she needs to get authorization approval for [place] in order to see them. Please review. They wont refill her medication until they see her-aa

## 2016-06-17 NOTE — Telephone Encounter (Signed)
Please refer gi for bowel incontinence. Dr. Bluford Kaufmann Thanks.

## 2016-06-17 NOTE — Telephone Encounter (Signed)
Patient has appt 07/03/2016.

## 2016-06-17 NOTE — Patient Outreach (Signed)
[facility]) Care Management  06/17/2016   Cheryl [name] October 13, 1951 [id number]  Transition of care call Cheryl [name] was discharged from [facility] on 7/21 to home.   Subjective:  RN placed call to patient as part of the transition of care program, HIPAA verified. Explained the purpose of the call.  Cheryl [name] reports that she is doing just fine. Patient denies fever, increased cough or shortness of breath.  She has a family member that sits with her during the day, while her husband is away. Patient states that she has not required her home oxygen at 1  liter since being at home, but has it if needed.  Cheryl [name] reports her husband helps her with keeping her medications organized. Patient was recently discharged from hospital and all medications have been reviewed.   Current Medications:  Current Outpatient Prescriptions  Medication Sig Dispense Refill  . acetaminophen (TYLENOL) 500 MG tablet Take 1,000 mg by mouth every 6 (six) hours as needed for mild pain or headache.     . albuterol (PROVENTIL HFA;VENTOLIN HFA) 108 (90 BASE) MCG/ACT inhaler Inhale 2 puffs into the lungs every 4 (four) hours as needed for wheezing or shortness of breath. 18 g 3  . albuterol (PROVENTIL) (2.5 MG/3ML) 0.083% nebulizer solution Take 3 mLs (2.5 mg total) by nebulization every 4 (four) hours as needed for wheezing. 75 mL 12  . ALPRAZolam (XANAX) 0.25 MG tablet Take 1 tablet (0.25 mg total) by mouth 3 (three) times daily as needed for anxiety. 30 tablet 0  . clopidogrel (PLAVIX) 75 MG tablet Take 75 mg by mouth daily.    . cyclobenzaprine (FLEXERIL) 5 MG tablet take 1-2 tablets by mouth every 8 hours if needed 30 tablet 3  . diphenoxylate-atropine (LOMOTIL) 2.5-0.025 MG per tablet Take 2 tablets by mouth 4 (four) times daily as needed for diarrhea or loose stools. Reported on 03/11/2016    . donepezil (ARICEPT) 10 MG tablet Take 10 mg by mouth at bedtime.    Marland Kitchen estropipate  (OGEN) 0.75 MG tablet Take 1 tablet (0.75 mg total) by mouth daily. 30 tablet 6  . fentaNYL (DURAGESIC - DOSED MCG/HR) 50 MCG/HR Place 1 patch (50 mcg total) onto the skin every 3 (three) days. 2 patch 0  . fluticasone (FLONASE) 50 MCG/ACT nasal spray Place 2 sprays into both nostrils daily. 16 g 1  . Fluticasone-Salmeterol (ADVAIR) 250-50 MCG/DOSE AEPB Inhale 1 puff into the lungs 2 (two) times daily.    . furosemide (LASIX) 40 MG tablet Take 40 mg by mouth daily.    . pantoprazole (PROTONIX) 40 MG tablet Take 40 mg by mouth daily.     . pregabalin (LYRICA) 150 MG capsule Take 1 capsule (150 mg total) by mouth every 8 (eight) hours. 90 capsule 2  . simvastatin (ZOCOR) 10 MG tablet Take 10 mg by mouth at bedtime. Reported on 12/04/2015    . SUMAtriptan (IMITREX) 25 MG tablet Take 25 mg by mouth as needed for migraine. May repeat in 2 hours if headache persists or recurs.    Marland Kitchen tiotropium (SPIRIVA) 18 MCG inhalation capsule Place 1 capsule (18 mcg total) into inhaler and inhale daily. 30 capsule 12  . traMADol (ULTRAM) 50 MG tablet Take 2 tablets (100 mg total) by mouth every 6 (six) hours as needed for severe pain. 24 tablet 0  . levofloxacin (LEVAQUIN) 500 MG tablet Take 1 tablet (500 mg total) by mouth daily. (Patient not taking: Reported on 06/17/2016) 3 tablet 0  .  predniSONE (STERAPRED UNI-PAK 21 TAB) 10 MG (21) TBPK tablet Take 1 tablet (10 mg total) by mouth daily. Start 60 mg po daily, taper 10 mg daily until done. (Patient not taking: Reported on 06/17/2016) 21 tablet 0   Current Facility-Administered Medications  Medication Dose Route Frequency Provider Last Rate Last Dose  . midazolam (VERSED) 5 MG/5ML injection 1-2 mg  1-2 mg Intravenous Q5 min PRN Delano Metz, MD        Functional Status:  In your present state of health, do you have any difficulty performing the following activities: 06/14/2016 06/03/2016  Hearing? N N  Vision? N N  Difficulty concentrating or making decisions? [name]  Walking or climbing stairs? Y Y  Dressing or bathing? Y Y  Doing errands, shopping? [name]  Preparing Food and eating ? Y -  Using the Toilet? N -  In the past six months, have you accidently leaked urine? Y -  Do you have problems with loss of bowel control? Y -  Managing your Medications? Y -  Managing your Finances? Y -  Housekeeping or managing your Housekeeping? N -  Some recent data might be hidden    Fall/Depression Screening: PHQ 2/9 Scores 06/17/2016 06/14/2016 05/06/2016 03/20/2016 03/11/2016 02/28/2016 01/05/2016  PHQ - 2 Score 0 0 0 0 0 0 0    Plan:  Patient will remain active weekly transition of care outreaches, and his agreeable to home visit, that will be scheduled at next telephonic visit. Patient will schedule PCP visit in the next 14 days.   [facility] CM Care Plan Problem One   Flowsheet Row Most Recent Value  Care Plan Problem One  Knowledge deficit related to self care management of COPD as evidenced by recent hospital admission  Role Documenting the Problem One  Care Management Coordinator  Care Plan for Problem One  Active  [facility] Long Term Goal (31-90 days)  Patient will not experience a hospital readmission in the next 31 days  [facility] Long Term Goal Start Date  06/17/16  Interventions for Problem One Long Term Goal  Explained the transition of care program, provided my contact number, reinforced importance of notiifying MD sooner of symptoms, taking medications as prescribed.,   [facility] CM Short Term Goal #1 (0-30 days)  Patient will schedule and attend PCP appointment in the next 14 days  [facility] CM Short Term Goal #1 Start Date  06/17/16  Interventions for Short Term Goal #1  Reinforced with patient the importance of attending post discharge visit with PCP, for medication reconciliation and follow up on condition , , veriifed her husband will provide transportation   [facility] CM Short Term Goal #2 (0-30 days)  Patient will be able to state at least 3 symptoms in yellow zone and action  plan to take in the next 30 days  [facility] CM Short Term Goal #2 Start Date  06/17/16  Interventions for Short Term Goal #2  Reviewed symptoms in yellow zone to notify MD of.      Egbert Garibaldi, RN, [facility] Care Management [phone]- Mobile [phone]- Toll Free Main Office

## 2016-06-18 ENCOUNTER — Encounter: Payer: Self-pay | Admitting: *Deleted

## 2016-06-18 DIAGNOSIS — I10 Essential (primary) hypertension: Secondary | ICD-10-CM | POA: Diagnosis not present

## 2016-06-18 DIAGNOSIS — J440 Chronic obstructive pulmonary disease with acute lower respiratory infection: Secondary | ICD-10-CM | POA: Diagnosis not present

## 2016-06-18 DIAGNOSIS — M810 Age-related osteoporosis without current pathological fracture: Secondary | ICD-10-CM | POA: Diagnosis not present

## 2016-06-18 DIAGNOSIS — G629 Polyneuropathy, unspecified: Secondary | ICD-10-CM | POA: Diagnosis not present

## 2016-06-18 DIAGNOSIS — F419 Anxiety disorder, unspecified: Secondary | ICD-10-CM | POA: Diagnosis not present

## 2016-06-18 DIAGNOSIS — J189 Pneumonia, unspecified organism: Secondary | ICD-10-CM | POA: Diagnosis not present

## 2016-06-18 DIAGNOSIS — J45909 Unspecified asthma, uncomplicated: Secondary | ICD-10-CM | POA: Diagnosis not present

## 2016-06-18 DIAGNOSIS — I2510 Atherosclerotic heart disease of native coronary artery without angina pectoris: Secondary | ICD-10-CM | POA: Diagnosis not present

## 2016-06-18 DIAGNOSIS — I739 Peripheral vascular disease, unspecified: Secondary | ICD-10-CM | POA: Diagnosis not present

## 2016-06-20 DIAGNOSIS — J189 Pneumonia, unspecified organism: Secondary | ICD-10-CM | POA: Diagnosis not present

## 2016-06-20 DIAGNOSIS — J440 Chronic obstructive pulmonary disease with acute lower respiratory infection: Secondary | ICD-10-CM | POA: Diagnosis not present

## 2016-06-20 DIAGNOSIS — G629 Polyneuropathy, unspecified: Secondary | ICD-10-CM | POA: Diagnosis not present

## 2016-06-20 DIAGNOSIS — M810 Age-related osteoporosis without current pathological fracture: Secondary | ICD-10-CM | POA: Diagnosis not present

## 2016-06-20 DIAGNOSIS — I739 Peripheral vascular disease, unspecified: Secondary | ICD-10-CM | POA: Diagnosis not present

## 2016-06-20 DIAGNOSIS — I10 Essential (primary) hypertension: Secondary | ICD-10-CM | POA: Diagnosis not present

## 2016-06-20 DIAGNOSIS — I2510 Atherosclerotic heart disease of native coronary artery without angina pectoris: Secondary | ICD-10-CM | POA: Diagnosis not present

## 2016-06-20 DIAGNOSIS — J45909 Unspecified asthma, uncomplicated: Secondary | ICD-10-CM | POA: Diagnosis not present

## 2016-06-20 DIAGNOSIS — F419 Anxiety disorder, unspecified: Secondary | ICD-10-CM | POA: Diagnosis not present

## 2016-06-21 DIAGNOSIS — J189 Pneumonia, unspecified organism: Secondary | ICD-10-CM | POA: Diagnosis not present

## 2016-06-21 DIAGNOSIS — J45909 Unspecified asthma, uncomplicated: Secondary | ICD-10-CM | POA: Diagnosis not present

## 2016-06-21 DIAGNOSIS — J440 Chronic obstructive pulmonary disease with acute lower respiratory infection: Secondary | ICD-10-CM | POA: Diagnosis not present

## 2016-06-21 DIAGNOSIS — I2510 Atherosclerotic heart disease of native coronary artery without angina pectoris: Secondary | ICD-10-CM | POA: Diagnosis not present

## 2016-06-21 DIAGNOSIS — G629 Polyneuropathy, unspecified: Secondary | ICD-10-CM | POA: Diagnosis not present

## 2016-06-21 DIAGNOSIS — M810 Age-related osteoporosis without current pathological fracture: Secondary | ICD-10-CM | POA: Diagnosis not present

## 2016-06-21 DIAGNOSIS — I10 Essential (primary) hypertension: Secondary | ICD-10-CM | POA: Diagnosis not present

## 2016-06-21 DIAGNOSIS — I739 Peripheral vascular disease, unspecified: Secondary | ICD-10-CM | POA: Diagnosis not present

## 2016-06-21 DIAGNOSIS — F419 Anxiety disorder, unspecified: Secondary | ICD-10-CM | POA: Diagnosis not present

## 2016-06-24 DIAGNOSIS — I10 Essential (primary) hypertension: Secondary | ICD-10-CM | POA: Diagnosis not present

## 2016-06-24 DIAGNOSIS — F419 Anxiety disorder, unspecified: Secondary | ICD-10-CM | POA: Diagnosis not present

## 2016-06-24 DIAGNOSIS — J189 Pneumonia, unspecified organism: Secondary | ICD-10-CM | POA: Diagnosis not present

## 2016-06-24 DIAGNOSIS — G629 Polyneuropathy, unspecified: Secondary | ICD-10-CM | POA: Diagnosis not present

## 2016-06-24 DIAGNOSIS — I2510 Atherosclerotic heart disease of native coronary artery without angina pectoris: Secondary | ICD-10-CM | POA: Diagnosis not present

## 2016-06-24 DIAGNOSIS — I739 Peripheral vascular disease, unspecified: Secondary | ICD-10-CM | POA: Diagnosis not present

## 2016-06-24 DIAGNOSIS — J45909 Unspecified asthma, uncomplicated: Secondary | ICD-10-CM | POA: Diagnosis not present

## 2016-06-24 DIAGNOSIS — J440 Chronic obstructive pulmonary disease with acute lower respiratory infection: Secondary | ICD-10-CM | POA: Diagnosis not present

## 2016-06-24 DIAGNOSIS — M810 Age-related osteoporosis without current pathological fracture: Secondary | ICD-10-CM | POA: Diagnosis not present

## 2016-06-25 ENCOUNTER — Other Ambulatory Visit: Payer: Self-pay | Admitting: *Deleted

## 2016-06-25 ENCOUNTER — Encounter: Payer: Self-pay | Admitting: *Deleted

## 2016-06-25 NOTE — Patient Outreach (Signed)
[facility]) Care Management  [facility] Care Manager  06/25/2016   Donelda Mailhot [birth year] [id number]  Subjective:  RN placed call and spoke with Mrs.Spadoni, HIPAA verified. Patient reports that she is doing great her breathing is fine. Patient discussed that her sister was with her and they are cleaning the bathroom. Mrs.Crady reports she had a visit from home health RN on 7/31 and everything was fine and the physical therapist comes on today.  Patient and family are planning a trip to the beach on this week. Encouraged patient to be sure to include all of her medications, oxygen and respiratory equipment she verbalized understanding and states the she always takes her equipment with her when out of town.    Encounter Medications:  Outpatient Encounter Prescriptions as of 06/25/2016  Medication Sig Note  . acetaminophen (TYLENOL) 500 MG tablet Take 1,000 mg by mouth every 6 (six) hours as needed for mild pain or headache.    . albuterol (PROVENTIL HFA;VENTOLIN HFA) 108 (90 BASE) MCG/ACT inhaler Inhale 2 puffs into the lungs every 4 (four) hours as needed for wheezing or shortness of breath.   Marland Kitchen albuterol (PROVENTIL) (2.5 MG/3ML) 0.083% nebulizer solution Take 3 mLs (2.5 mg total) by nebulization every 4 (four) hours as needed for wheezing.   Marland Kitchen ALPRAZolam (XANAX) 0.25 MG tablet Take 1 tablet (0.25 mg total) by mouth 3 (three) times daily as needed for anxiety.   . clopidogrel (PLAVIX) 75 MG tablet Take 75 mg by mouth daily. 04/23/2016: Stopped Plavix for procedure on  X 7 - 8 days ago.  . cyclobenzaprine (FLEXERIL) 5 MG tablet take 1-2 tablets by mouth every 8 hours if needed   . diphenoxylate-atropine (LOMOTIL) 2.5-0.025 MG per tablet Take 2 tablets by mouth 4 (four) times daily as needed for diarrhea or loose stools. Reported on 03/11/2016   . donepezil (ARICEPT) 10 MG tablet Take 10 mg by mouth at bedtime.   Marland Kitchen estropipate (OGEN) 0.75 MG tablet Take 1 tablet (0.75 mg total) by  mouth daily.   . fentaNYL (DURAGESIC - DOSED MCG/HR) 50 MCG/HR Place 1 patch (50 mcg total) onto the skin every 3 (three) days.   . fluticasone (FLONASE) 50 MCG/ACT nasal spray Place 2 sprays into both nostrils daily.   . Fluticasone-Salmeterol (ADVAIR) 250-50 MCG/DOSE AEPB Inhale 1 puff into the lungs 2 (two) times daily.   . furosemide (LASIX) 40 MG tablet Take 40 mg by mouth daily.   Marland Kitchen levofloxacin (LEVAQUIN) 500 MG tablet Take 1 tablet (500 mg total) by mouth daily. (Patient not taking: Reported on 06/17/2016)   . pantoprazole (PROTONIX) 40 MG tablet Take 40 mg by mouth daily.    . predniSONE (STERAPRED UNI-PAK 21 TAB) 10 MG (21) TBPK tablet Take 1 tablet (10 mg total) by mouth daily. Start 60 mg po daily, taper 10 mg daily until done. (Patient not taking: Reported on 06/17/2016)   . pregabalin (LYRICA) 150 MG capsule Take 1 capsule (150 mg total) by mouth every 8 (eight) hours.   . simvastatin (ZOCOR) 10 MG tablet Take 10 mg by mouth at bedtime. Reported on 12/04/2015   . SUMAtriptan (IMITREX) 25 MG tablet Take 25 mg by mouth as needed for migraine. May repeat in 2 hours if headache persists or recurs.   Marland Kitchen tiotropium (SPIRIVA) 18 MCG inhalation capsule Place 1 capsule (18 mcg total) into inhaler and inhale daily.   . traMADol (ULTRAM) 50 MG tablet Take 2 tablets (100 mg total) by mouth every  6 (six) hours as needed for severe pain.    Facility-Administered Encounter Medications as of 06/25/2016  Medication  . midazolam (VERSED) 5 MG/5ML injection 1-2 mg    Functional Status:  In your present state of health, do you have any difficulty performing the following activities: 06/14/2016 06/03/2016  Hearing? N N  Vision? N N  Difficulty concentrating or making decisions? [name]  Walking or climbing stairs? Y Y  Dressing or bathing? Y Y  Doing errands, shopping? [name]  Preparing Food and eating ? Y -  Using the Toilet? N -  In the past six months, have you accidently leaked urine? Y -  Do you have  problems with loss of bowel control? Y -  Managing your Medications? Y -  Managing your Finances? Y -  Housekeeping or managing your Housekeeping? N -  Some recent data might be hidden    Fall/Depression Screening: PHQ 2/9 Scores 06/17/2016 06/14/2016 05/06/2016 03/20/2016 03/11/2016 02/28/2016 01/05/2016  PHQ - 2 Score 0 0 0 0 0 0 0    Assessment:   Patient denies any recent episodes of increased shortness of breath, cough . Able to teach back importance of having medical equipment and all medications with her as well as limiting time in elevated temperatures outside.  Plan:  Will remain active with transition of care program, weekly telephone outreach scheduled for next week and initial home visit within 2 weeks.   [facility] CM Care Plan Problem One   Flowsheet Row Most Recent Value  Care Plan Problem One  Knowledge deficit related to self care management of COPD as evidenced by recent hospital admission  Role Documenting the Problem One  Care Management Coordinator  Care Plan for Problem One  Active  [facility] Long Term Goal (31-90 days)  Patient will not experience a hospital readmission in the next 31 days  [facility] Long Term Goal Start Date  06/17/16  Interventions for Problem One Long Term Goal  Discussed with patient importance of including of medications, medical equipment, avoid extreme in temperture with weather with teach back  [facility] CM Short Term Goal #1 (0-30 days)  Patient will schedule and attend PCP appointment in the next 14 days  [facility] CM Short Term Goal #1 Start Date  06/17/16  Interventions for Short Term Goal #1  Patient able to recall date of her appointment, reinforced attending   [facility] CM Short Term Goal #2 (0-30 days)  Patient will be able to state at least 3 symptoms in yellow zone and action plan to take in the next 30 days  [facility] CM Short Term Goal #2 Start Date  06/17/16  Interventions for Short Term Goal #2  Reinforced symptoms in yellow zone for patient to seek MD attention for , with  teachback      Egbert Garibaldi, RN, [facility] Care Management [phone]- Mobile [phone]- Toll Free Main Office

## 2016-06-27 DIAGNOSIS — I10 Essential (primary) hypertension: Secondary | ICD-10-CM | POA: Diagnosis not present

## 2016-06-27 DIAGNOSIS — J189 Pneumonia, unspecified organism: Secondary | ICD-10-CM | POA: Diagnosis not present

## 2016-06-27 DIAGNOSIS — I739 Peripheral vascular disease, unspecified: Secondary | ICD-10-CM | POA: Diagnosis not present

## 2016-06-27 DIAGNOSIS — M810 Age-related osteoporosis without current pathological fracture: Secondary | ICD-10-CM | POA: Diagnosis not present

## 2016-06-27 DIAGNOSIS — G629 Polyneuropathy, unspecified: Secondary | ICD-10-CM | POA: Diagnosis not present

## 2016-06-27 DIAGNOSIS — J440 Chronic obstructive pulmonary disease with acute lower respiratory infection: Secondary | ICD-10-CM | POA: Diagnosis not present

## 2016-06-27 DIAGNOSIS — J45909 Unspecified asthma, uncomplicated: Secondary | ICD-10-CM | POA: Diagnosis not present

## 2016-06-27 DIAGNOSIS — F419 Anxiety disorder, unspecified: Secondary | ICD-10-CM | POA: Diagnosis not present

## 2016-06-27 DIAGNOSIS — I2510 Atherosclerotic heart disease of native coronary artery without angina pectoris: Secondary | ICD-10-CM | POA: Diagnosis not present

## 2016-06-28 DIAGNOSIS — I739 Peripheral vascular disease, unspecified: Secondary | ICD-10-CM | POA: Diagnosis not present

## 2016-06-28 DIAGNOSIS — J45909 Unspecified asthma, uncomplicated: Secondary | ICD-10-CM | POA: Diagnosis not present

## 2016-06-28 DIAGNOSIS — G629 Polyneuropathy, unspecified: Secondary | ICD-10-CM | POA: Diagnosis not present

## 2016-06-28 DIAGNOSIS — J189 Pneumonia, unspecified organism: Secondary | ICD-10-CM | POA: Diagnosis not present

## 2016-06-28 DIAGNOSIS — J440 Chronic obstructive pulmonary disease with acute lower respiratory infection: Secondary | ICD-10-CM | POA: Diagnosis not present

## 2016-06-28 DIAGNOSIS — F419 Anxiety disorder, unspecified: Secondary | ICD-10-CM | POA: Diagnosis not present

## 2016-06-28 DIAGNOSIS — M810 Age-related osteoporosis without current pathological fracture: Secondary | ICD-10-CM | POA: Diagnosis not present

## 2016-06-28 DIAGNOSIS — I10 Essential (primary) hypertension: Secondary | ICD-10-CM | POA: Diagnosis not present

## 2016-06-28 DIAGNOSIS — I2510 Atherosclerotic heart disease of native coronary artery without angina pectoris: Secondary | ICD-10-CM | POA: Diagnosis not present

## 2016-07-01 DIAGNOSIS — J189 Pneumonia, unspecified organism: Secondary | ICD-10-CM | POA: Diagnosis not present

## 2016-07-01 DIAGNOSIS — F419 Anxiety disorder, unspecified: Secondary | ICD-10-CM | POA: Diagnosis not present

## 2016-07-01 DIAGNOSIS — I10 Essential (primary) hypertension: Secondary | ICD-10-CM | POA: Diagnosis not present

## 2016-07-01 DIAGNOSIS — G629 Polyneuropathy, unspecified: Secondary | ICD-10-CM | POA: Diagnosis not present

## 2016-07-01 DIAGNOSIS — I2510 Atherosclerotic heart disease of native coronary artery without angina pectoris: Secondary | ICD-10-CM | POA: Diagnosis not present

## 2016-07-01 DIAGNOSIS — J45909 Unspecified asthma, uncomplicated: Secondary | ICD-10-CM | POA: Diagnosis not present

## 2016-07-01 DIAGNOSIS — M810 Age-related osteoporosis without current pathological fracture: Secondary | ICD-10-CM | POA: Diagnosis not present

## 2016-07-01 DIAGNOSIS — I739 Peripheral vascular disease, unspecified: Secondary | ICD-10-CM | POA: Diagnosis not present

## 2016-07-01 DIAGNOSIS — J440 Chronic obstructive pulmonary disease with acute lower respiratory infection: Secondary | ICD-10-CM | POA: Diagnosis not present

## 2016-07-02 ENCOUNTER — Other Ambulatory Visit: Payer: Self-pay | Admitting: *Deleted

## 2016-07-02 NOTE — Patient Outreach (Signed)
[facility]) Care Management  07/02/2016  Cheryl MargaritaMary George [name] December 17, 1950 [id number]   Transition of care call.   RNCM placed transition of care call to patient , HIPAA verified. [name] states she is doing just fine, breathing is good. Patient discussed her recent trip to the beach, she enjoyed herself for breathing problems, denies need to use her rescue inhaler.Patient reports being tired after her trip. [name] had home visit with RN and physical therapist on yesterday, she report making good process, and still using her walker.  Denies any new concerns at this time, she has PCP office visit for 8/9.  Plan Will remain active with transition of care program,home visit scheduled in next week. Patient will notify MD , of worsening of breathing , symptoms in yellow zone.  Egbert GaribaldiKimberly Shaliah Wann, RN, [facility] Care Management [phone] Mobile [phone]- Toll Free Main Office

## 2016-07-02 NOTE — Progress Notes (Signed)
Patient's Name: Cheryl [name]  Patient type: Established  MRN: [id number]  Service setting: Ambulatory outpatient  DOB: 07-03-1951  Location: [facility]  DOS: 07/03/2016  Primary Care Physician: Mila Merry, MD  Note by: Sydnee Levans. Laban Emperor, M.D  Referring Physician: Malva Limes, MD  Specialty: Interventional Pain Management  Last Visit to Pain Management: 05/06/2016   Primary Reason(s) for Visit: Encounter for prescription drug management (Level of risk: moderate) CC: Back Pain (left, lower)   HPI  Ms. Clucas is a 65 y.o. year old, female patient, who returns today as an established patient. She has Essential hypertension; GERD (gastroesophageal reflux disease); Hyperlipemia; Anxiety; Airway hyperreactivity; Back pain, thoracic; Coronary artery disease; Excessive falling; Alteration in bowel elimination: incontinence; Insomnia; Decreased testosterone level; Leg weakness; Menopausal symptom; Migraine; Neuropathy (HCC); Fecal occult blood test positive; OP (osteoporosis); Panic disorder; Compulsive tobacco user syndrome; Urinary incontinence; Vitamin D deficiency; Weight loss; COPD (chronic obstructive pulmonary disease) (HCC); Dementia; Lumbar radicular pain; Chronic low back pain; Encounter for therapeutic drug level monitoring; Uncomplicated opioid dependence (HCC); Chronic pain syndrome; Hypercholesteremia; Peripheral nerve disease (HCC); Peripheral vascular disease (HCC); Platelet inhibition due to Plavix; Abnormal mammogram of right breast; Dilated intrahepatic bile duct; Dysphagia; Acute diastolic CHF (congestive heart failure) (HCC); Long term current use of opiate analgesic; Chronic pain; Fibromyalgia; Osteoarthrosis; Long term current use of anticoagulant therapy (Plavix); Long term prescription opiate use; Opiate use (160 MME/Day); Anemia; Lumbar facet syndrome (Location of Primary Source of Pain) (Bilateral); Lumbar spondylosis; Pneumonia; and Nausea with vomiting  on her problem list.. Her primarily concern today is the Back Pain (left, lower)   Pain Assessment: Self-Reported Pain Score: 3              Reported level is compatible with observation       Pain Type: Chronic pain Pain Location: Back Pain Orientation: Left, Lower Pain Descriptors / Indicators: Throbbing Pain Frequency: Constant  The patient comes into the clinics today for pharmacological management of her chronic pain. I last saw this patient on 05/06/2016. The patient  reports that she does not use drugs. Her body mass index is 18.89 kg/m.  Date of Last Visit: 05/06/16 Service Provided on Last Visit: Procedure  Controlled Substance Pharmacotherapy Assessment & REMS (Risk Evaluation and Mitigation Strategy)  Analgesic: Duragesic 50 mcg/h every 72 hours. MME/day: 160 mg/day. Pill Count: Tramadol 50 mg #140 out of 240 remaining. Filled 06-20-16. Fentanyl Patch 50 mcg # 20 out of 20 remaining. Filled 07-02-16. Pharmacokinetics: Onset of action (Liberation/Absorption): Within expected pharmacological parameters Time to Peak effect (Distribution): Timing and results are as within normal expected parameters Duration of action (Metabolism/Excretion): Within normal limits for medication Pharmacodynamics: Analgesic Effect: More than 50% Activity Facilitation: Medication(s) allow patient to sit, stand, walk, and do the basic ADLs Perceived Effectiveness: Described as relatively effective, allowing for increase in activities of daily living (ADL) Side-effects or Adverse reactions: None reported Monitoring: [place] PMP: Online review of the past 76-month period conducted. Compliant with practice rules and regulations Last UDS on record: ToxAssure Select 13  Date Value Ref Range Status  03/20/2016 FINAL  Final    Comment:    ==================================================================== TOXASSURE SELECT 13 (MW) ==================================================================== Test                              Result       Flag       Units Drug Present and Declared for  Prescription Verification   Fentanyl                       57           EXPECTED   ng/mg creat   Norfentanyl                    >485         EXPECTED   ng/mg creat    Source of fentanyl is a scheduled prescription medication,    including IV, patch, and transmucosal formulations. Norfentanyl    is an expected metabolite of fentanyl.   Tramadol                       PRESENT      EXPECTED   O-Desmethyltramadol            PRESENT      EXPECTED   N-Desmethyltramadol            PRESENT      EXPECTED    Source of tramadol is a prescription medication.    O-desmethyltramadol and N-desmethyltramadol are expected    metabolites of tramadol. ==================================================================== Test                      Result    Flag   Units      Ref Range   Creatinine              206              mg/dL      >=40 ==================================================================== Declared Medications:  The flagging and interpretation on this report are based on the  following declared medications.  Unexpected results may arise from  inaccuracies in the declared medications.  **Note: The testing scope of this panel includes these medications:  Fentanyl (Duragesic)  Tramadol (Ultram)  **Note: The testing scope of this panel does not include following  reported medications:  Atropine (Lomotil)  Diphenoxylate (Lomotil)  Donepezil (Aricept)  Estropipate (Ogen)  Fluticasone (Advair)  Furosemide (Lasix)  Pantoprazole (Protonix)  Pregabalin (Lyrica)  Salmeterol (Advair)  Simvastatin (Zocor)  Sumatriptan (Imitrex)  Tiotropium (Spiriva) ==================================================================== For clinical consultation, please call [phone]. ====================================================================    UDS interpretation: Compliant          Medication Assessment  Form: Reviewed. Patient indicates being compliant with therapy Treatment compliance: Compliant Risk Assessment: Aberrant Behavior: None observed today Substance Use Disorder (SUD) Risk Level: Low-to-moderate Risk of opioid abuse or dependence: 0.7-3.0% with doses ? 36 MME/day and 6.1-26% with doses ? 120 MME/day. Opioid Risk Tool (ORT) Score:  3 Low Risk for SUD (Score <3) Depression Scale Score: PHQ-2: PHQ-2 Total Score: 0 No depression (0) PHQ-9: PHQ-9 Total Score: 0 No depression (0-4)  Pharmacologic Plan: No change in therapy, at this time  Laboratory Chemistry  Inflammation Markers Lab Results  Component Value Date   ESRSEDRATE 52 (H) 03/01/2016   CRP 0.6 03/01/2016    Renal Function Lab Results  Component Value Date   BUN 25 (H) 06/05/2016   CREATININE 0.68 06/05/2016   GFRAA >60 06/05/2016   GFRNONAA >60 06/05/2016    Hepatic Function Lab Results  Component Value Date   AST 28 06/02/2016   ALT 14 06/02/2016   ALBUMIN 3.3 (L) 06/02/2016    Electrolytes Lab Results  Component Value Date   NA 138 06/05/2016   K 4.0 06/05/2016   CL 99 (  L) 06/05/2016   CALCIUM 8.9 06/05/2016   MG 1.9 06/04/2016    Pain Modulating Vitamins Lab Results  Component Value Date   VD25OH 25.6 (L) 01/17/2016   VITAMINB12 242 03/01/2016    Coagulation Parameters Lab Results  Component Value Date   INR 1.08 10/17/2015   LABPROT 14.2 10/17/2015   APTT 38 (H) 10/17/2015   PLT 252 06/05/2016    Cardiovascular Lab Results  Component Value Date   HGB 11.9 (L) 06/05/2016   HCT 34.7 (L) 06/05/2016    Note: Lab results reviewed.  Recent Diagnostic Imaging  Ct Chest W Contrast  Result Date: 06/03/2016 CLINICAL DATA:  Fever, hypoxia, altered mental status. Nausea. Poor p.o. intake. EXAM: CT CHEST WITH CONTRAST TECHNIQUE: Multidetector CT imaging of the chest was performed during intravenous contrast administration. CONTRAST:  75mL ISOVUE-300 IOPAMIDOL (ISOVUE-300) INJECTION  61% COMPARISON:  10/18/2015 FINDINGS: Normal heart size. Normal caliber thoracic aorta. No aortic dissection. Great vessel origins are patent. Scattered lymph nodes throughout the mediastinum and in both hila are prominent without pathologic enlargement, likely reactive. Similar appearance to previous study. Subcarinal lymph nodes today are measuring 15 mm compared with 14.4 mm previously. Moderate-sized esophageal hiatal hernia. No esophageal dilatation. Mild emphysematous changes in the lungs with scattered peripheral fibrosis. Focal areas of nodular peribronchial infiltration demonstrated in the right middle lung and left lower lung. Bronchial wall thickening demonstrated bilaterally. Changes likely to represent multifocal bronchopneumonia. Airways appear to be patent. Small air cyst in the right lung base. No pleural effusions. No pneumothorax. Upper abdominal organs demonstrate mild central intrahepatic bile duct dilatation of nonspecific etiology. Correlate with liver function studies. Degenerative changes throughout the spine. IMPRESSION: Focal areas of nodular peribronchial infiltration demonstrated in both lungs with bronchial wall thickening diffusely likely to represent multifocal bronchopneumonia. Electronically Signed   By: Burman Nieves M.D.   On: 06/03/2016 01:34   Dg Chest Port 1 View  Result Date: 06/02/2016 CLINICAL DATA:  65 year old female with fever EXAM: PORTABLE CHEST 1 VIEW COMPARISON:  Chest radiograph dated 11/22/2015 FINDINGS: Single portable view of chest demonstrate a emphysematous changes of the lungs. There is no focal consolidation. No pleural effusion or pneumothorax. Mild cardiomegaly. There is osteopenia with degenerative changes of the spine. No acute fracture. IMPRESSION: No active disease. Electronically Signed   By: Elgie Collard M.D.   On: 06/02/2016 22:45    Meds  The patient has a current medication list which includes the following prescription(s):  acetaminophen, albuterol, albuterol, alprazolam, clopidogrel, cyclobenzaprine, diphenoxylate-atropine, donepezil, estropipate, fentanyl, fluticasone, fluticasone-salmeterol, furosemide, lansoprazole, pantoprazole, pregabalin, simvastatin, sumatriptan, tiotropium, and tramadol.  Current Outpatient Prescriptions on File Prior to Visit  Medication Sig  . acetaminophen (TYLENOL) 500 MG tablet Take 1,000 mg by mouth every 6 (six) hours as needed for mild pain or headache.   . albuterol (PROVENTIL HFA;VENTOLIN HFA) 108 (90 BASE) MCG/ACT inhaler Inhale 2 puffs into the lungs every 4 (four) hours as needed for wheezing or shortness of breath.  Marland Kitchen albuterol (PROVENTIL) (2.5 MG/3ML) 0.083% nebulizer solution Take 3 mLs (2.5 mg total) by nebulization every 4 (four) hours as needed for wheezing.  Marland Kitchen ALPRAZolam (XANAX) 0.25 MG tablet Take 1 tablet (0.25 mg total) by mouth 3 (three) times daily as needed for anxiety.  . clopidogrel (PLAVIX) 75 MG tablet Take 75 mg by mouth daily.  . cyclobenzaprine (FLEXERIL) 5 MG tablet take 1-2 tablets by mouth every 8 hours if needed  . diphenoxylate-atropine (LOMOTIL) 2.5-0.025 MG per tablet Take 2 tablets by  mouth 4 (four) times daily as needed for diarrhea or loose stools. Reported on 03/11/2016  . donepezil (ARICEPT) 10 MG tablet Take 10 mg by mouth at bedtime.  Marland Kitchen estropipate (OGEN) 0.75 MG tablet Take 1 tablet (0.75 mg total) by mouth daily.  . fluticasone (FLONASE) 50 MCG/ACT nasal spray Place 2 sprays into both nostrils daily.  . Fluticasone-Salmeterol (ADVAIR) 250-50 MCG/DOSE AEPB Inhale 1 puff into the lungs 2 (two) times daily.  . furosemide (LASIX) 40 MG tablet Take 40 mg by mouth daily.  . pantoprazole (PROTONIX) 40 MG tablet Take 40 mg by mouth daily.   . simvastatin (ZOCOR) 10 MG tablet Take 10 mg by mouth at bedtime. Reported on 12/04/2015  . SUMAtriptan (IMITREX) 25 MG tablet Take 25 mg by mouth as needed for migraine. May repeat in 2 hours if headache persists or  recurs.  Marland Kitchen tiotropium (SPIRIVA) 18 MCG inhalation capsule Place 1 capsule (18 mcg total) into inhaler and inhale daily.   No current facility-administered medications on file prior to visit.     ROS  Constitutional: Denies any fever or chills Gastrointestinal: No reported hemesis, hematochezia, vomiting, or acute GI distress Musculoskeletal: Denies any acute onset joint swelling, redness, loss of ROM, or weakness Neurological: No reported episodes of acute onset apraxia, aphasia, dysarthria, agnosia, amnesia, paralysis, loss of coordination, or loss of consciousness  Allergies  Ms. Trembath is allergic to percocet [oxycodone-acetaminophen]; aspirin; codeine; morphine; propoxyphene; and sulfa antibiotics.  PFSH  Medical:  Ms. Gilles  has a past medical history of Allergy; Anxiety; Arthritis; Aspiration pneumonitis (HCC) (11/24/2015); Asthma; CHF (congestive heart failure) (HCC); Chronic pain; COPD (chronic obstructive pulmonary disease) (HCC); Coronary artery disease; DVT (deep venous thrombosis) (HCC); GERD (gastroesophageal reflux disease); Headache; Hyperlipidemia; Hypertension; Migraines; Neuropathy (HCC) (2010); Osteoporosis; Oxygen deficiency; Peripheral vascular disease (HCC); Pneumonia; Pneumonia (11/19/2015); and Vitamin D deficiency. Family: family history includes Arthritis in her mother; Cancer in her brother, brother, and mother; Diabetes in her brother, mother, and sister; Heart disease in her father and mother. Surgical:  has a past surgical history that includes Appendectomy; Spine surgery; Foot surgery (Bilateral); cardiac catherization (10/31/2009); abdomnal aortic stent (05/30/2008); Abdominal hysterectomy (1975); Cholecystectomy (1972); Cervical fusion (C5 - 6/C6-7); Appendectomy; Colonoscopy with propofol (N/A, 07/27/2015); and Esophagogastroduodenoscopy (egd) with propofol (N/A, 07/27/2015). Tobacco:  reports that she has been smoking Cigarettes.  She has a 25.00 pack-year smoking  history. She has never used smokeless tobacco. Alcohol:  reports that she does not drink alcohol. Drug:  reports that she does not use drugs.  Constitutional Exam  Vitals: Blood pressure (!) 148/66, pulse 80, temperature 98.2 F (36.8 C), temperature source Oral, resp. rate 16, height 5\' 1"  (1.549 m), weight 100 lb (45.4 kg), SpO2 (!) 89 %. General appearance: Well nourished, well developed, and well hydrated. In no acute distress Calculated BMI/Body habitus: Body mass index is 18.89 kg/m. (18.5-24.9 kg/m2) Ideal body weight Psych/Mental status: Alert and oriented x 3 (person, place, & time) Eyes: PERLA Respiratory: No evidence of acute respiratory distress  Cervical Spine Exam  Inspection: No masses, redness, or swelling Alignment: Symmetrical Functional ROM: ROM appears unrestricted Stability: No instability detected Muscle strength & Tone: Functionally intact Sensory: Unimpaired Palpation: Non-contributory  Upper Extremity (UE) Exam    Side: Right upper extremity  Side: Left upper extremity  Inspection: No masses, redness, swelling, or asymmetry  Inspection: No masses, redness, swelling, or asymmetry  Functional ROM: ROM appears unrestricted  Functional ROM: ROM appears unrestricted  Muscle strength & Tone: Functionally  intact  Muscle strength & Tone: Functionally intact  Sensory: Unimpaired  Sensory: Unimpaired  Palpation: Non-contributory  Palpation: Non-contributory   Thoracic Spine Exam  Inspection: increased thoracic Kyphosis Alignment: Symmetrical Functional ROM: ROM appears unrestricted Stability: No instability detected Sensory: Unimpaired Muscle strength & Tone: Functionally intact Palpation: Non-contributory  Lumbar Spine Exam  Inspection: No masses, redness, or swelling Alignment: Symmetrical Functional ROM: ROM appears unrestricted Stability: No instability detected Muscle strength & Tone: Functionally intact Sensory: Unimpaired Palpation:  Non-contributory Provocative Tests: Lumbar Hyperextension and rotation test: evaluation deferred today       Patrick's Maneuver: evaluation deferred today              Gait & Posture Assessment  Ambulation: Unassisted Gait: Relatively normal for age and body habitus Posture: WNL   Lower Extremity Exam    Side: Right lower extremity  Side: Left lower extremity  Inspection: No masses, redness, swelling, or asymmetry  Inspection: No masses, redness, swelling, or asymmetry  Functional ROM: ROM appears unrestricted  Functional ROM: ROM appears unrestricted  Muscle strength & Tone: Functionally intact  Muscle strength & Tone: Functionally intact  Sensory: Unimpaired  Sensory: Unimpaired  Palpation: Non-contributory  Palpation: Non-contributory    Assessment & Plan  Primary Diagnosis & Pertinent Problem List: The primary encounter diagnosis was Chronic pain. Diagnoses of Long term current use of opiate analgesic, Opiate use (160 MME/Day), Encounter for therapeutic drug level monitoring, Lumbar facet syndrome (Location of Primary Source of Pain) (Bilateral), and Fibromyalgia were also pertinent to this visit.  Visit Diagnosis: 1. Chronic pain   2. Long term current use of opiate analgesic   3. Opiate use (160 MME/Day)   4. Encounter for therapeutic drug level monitoring   5. Lumbar facet syndrome (Location of Primary Source of Pain) (Bilateral)   6. Fibromyalgia     Problems updated and reviewed during this visit: No problems updated.  Problem-specific Plan(s): No problem-specific Assessment & Plan notes found for this encounter.  No new Assessment & Plan notes have been filed under this hospital service since the last note was generated. Service: Pain Management   Plan of Care   Problem List Items Addressed This Visit      High   Chronic pain - Primary (Chronic)   Relevant Medications   pregabalin (LYRICA) 150 MG capsule   traMADol (ULTRAM) 50 MG tablet   fentaNYL  (DURAGESIC - DOSED MCG/HR) 50 MCG/HR   Fibromyalgia (Chronic)   Relevant Medications   pregabalin (LYRICA) 150 MG capsule   Lumbar facet syndrome (Location of Primary Source of Pain) (Bilateral) (Chronic)   Relevant Medications   traMADol (ULTRAM) 50 MG tablet   fentaNYL (DURAGESIC - DOSED MCG/HR) 50 MCG/HR   Other Relevant Orders   LUMBAR FACET(MEDIAL BRANCH NERVE BLOCK) MBNB     Medium   Encounter for therapeutic drug level monitoring   Long term current use of opiate analgesic (Chronic)   Opiate use (160 MME/Day) (Chronic)    Other Visit Diagnoses   None.      Pharmacotherapy (Medications Ordered): Meds ordered this encounter  Medications  . pregabalin (LYRICA) 150 MG capsule    Sig: Take 1 capsule (150 mg total) by mouth every 8 (eight) hours.    Dispense:  90 capsule    Refill:  2    Do not place this medication, or any other prescription from our practice, on "Automatic Refill". Patient may have prescription filled one day early if pharmacy is closed on scheduled refill date.  Marland Kitchen  traMADol (ULTRAM) 50 MG tablet    Sig: Take 2 tablets (100 mg total) by mouth every 6 (six) hours as needed for severe pain.    Dispense:  24 tablet    Refill:  0    Do not place this medication, or any other prescription from our practice, on "Automatic Refill". Patient may have prescription filled one day early if pharmacy is closed on scheduled refill date.  . fentaNYL (DURAGESIC - DOSED MCG/HR) 50 MCG/HR    Sig: Place 1 patch (50 mcg total) onto the skin every 3 (three) days.    Dispense:  10 patch    Refill:  0    Do not place this medication, or any other prescription from our practice, on "Automatic Refill". Patient may have prescription filled one day early if pharmacy is closed on scheduled refill date. Do not fill until: 09/11/16 To last until: 10/11/16    [facility]: Orders Placed This Encounter  Procedures  . LUMBAR FACET(MEDIAL BRANCH NERVE BLOCK) MBNB     Imaging Ordered: None  Interventional Therapies: Scheduled:  None at this time.    Considering:   Bilateral lumbar facet radiofrequency neurotomy under fluoroscopic guidance and IV sedation.    PRN Procedures:   Bilateral lumbar facet block under fluoroscopic guidance and IV sedation #2.    Referral(s) or Consult(s): None at this time.  New Prescriptions   No medications on file    Medications administered during this visit: [name] had no medications administered during this visit.  Requested PM Follow-up: Return in 3 months (on 09/25/2016) for (3-Mo) Med-Mgmt.  Future Appointments Date Time Provider Department Center  07/03/2016 2:00 PM Malva Limesonald E Fisher, MD [facility] None  07/08/2016 12:00 PM Georgette DoverKimberly A Glover, RN [facility] None  07/16/2016 10:30 AM Georgette DoverKimberly A Glover, RN [facility] None  07/23/2016 9:30 AM Georgette DoverKimberly A Glover, RN [facility] None  09/25/2016 8:20 AM Delano MetzFrancisco Ariann Khaimov, MD [facility] None    Primary Care Physician: Mila Merryonald Fisher, MD [facility] Note by: Sydnee LevansFrancisco A. Laban EmperorNaveira, M.D, DABA, DABAPM, DABPM, DABIPP, FIPP  Pain Score Disclaimer: We use the NRS-11 scale. This is a self-reported, subjective measurement of pain severity with only modest accuracy. It is used primarily to identify changes within a particular patient. It must be understood that outpatient pain scales are significantly less accurate that those used for research, where they can be applied under ideal controlled circumstances with minimal exposure to variables. In reality, the score is likely to be a combination of pain intensity and pain affect, where pain affect describes the degree of emotional arousal or changes in action readiness caused by the sensory experience of pain. Factors such as social and work situation, setting, emotional state, anxiety levels, expectation, and prior pain experience may influence pain perception and show large inter-individual  differences that may also be affected by time variables.  Patient instructions provided during this appointment: Patient Instructions  You were given one prescription for Duragesic patch, one for Tramadol, and one for Lyrica.

## 2016-07-03 ENCOUNTER — Ambulatory Visit (INDEPENDENT_AMBULATORY_CARE_PROVIDER_SITE_OTHER): Payer: Commercial Managed Care - HMO | Admitting: Family Medicine

## 2016-07-03 ENCOUNTER — Encounter: Payer: Self-pay | Admitting: Pain Medicine

## 2016-07-03 ENCOUNTER — Ambulatory Visit: Payer: Commercial Managed Care - HMO | Attending: Pain Medicine | Admitting: Pain Medicine

## 2016-07-03 ENCOUNTER — Encounter: Payer: Self-pay | Admitting: Family Medicine

## 2016-07-03 VITALS — BP 148/66 | HR 80 | Temp 98.2°F | Resp 16 | Ht 61.0 in | Wt 100.0 lb

## 2016-07-03 VITALS — BP 120/60 | HR 88 | Temp 97.5°F | Resp 16 | Ht 61.0 in | Wt 97.4 lb

## 2016-07-03 DIAGNOSIS — K449 Diaphragmatic hernia without obstruction or gangrene: Secondary | ICD-10-CM | POA: Insufficient documentation

## 2016-07-03 DIAGNOSIS — E785 Hyperlipidemia, unspecified: Secondary | ICD-10-CM | POA: Diagnosis not present

## 2016-07-03 DIAGNOSIS — Z7902 Long term (current) use of antithrombotics/antiplatelets: Secondary | ICD-10-CM | POA: Insufficient documentation

## 2016-07-03 DIAGNOSIS — I10 Essential (primary) hypertension: Secondary | ICD-10-CM | POA: Insufficient documentation

## 2016-07-03 DIAGNOSIS — G43909 Migraine, unspecified, not intractable, without status migrainosus: Secondary | ICD-10-CM | POA: Insufficient documentation

## 2016-07-03 DIAGNOSIS — M545 Low back pain: Secondary | ICD-10-CM

## 2016-07-03 DIAGNOSIS — F1190 Opioid use, unspecified, uncomplicated: Secondary | ICD-10-CM

## 2016-07-03 DIAGNOSIS — R634 Abnormal weight loss: Secondary | ICD-10-CM | POA: Diagnosis not present

## 2016-07-03 DIAGNOSIS — F419 Anxiety disorder, unspecified: Secondary | ICD-10-CM | POA: Diagnosis not present

## 2016-07-03 DIAGNOSIS — Z5181 Encounter for therapeutic drug level monitoring: Secondary | ICD-10-CM | POA: Diagnosis not present

## 2016-07-03 DIAGNOSIS — M797 Fibromyalgia: Secondary | ICD-10-CM | POA: Insufficient documentation

## 2016-07-03 DIAGNOSIS — R531 Weakness: Secondary | ICD-10-CM | POA: Diagnosis not present

## 2016-07-03 DIAGNOSIS — E559 Vitamin D deficiency, unspecified: Secondary | ICD-10-CM | POA: Insufficient documentation

## 2016-07-03 DIAGNOSIS — F17210 Nicotine dependence, cigarettes, uncomplicated: Secondary | ICD-10-CM | POA: Diagnosis not present

## 2016-07-03 DIAGNOSIS — G4700 Insomnia, unspecified: Secondary | ICD-10-CM | POA: Insufficient documentation

## 2016-07-03 DIAGNOSIS — Z9181 History of falling: Secondary | ICD-10-CM | POA: Insufficient documentation

## 2016-07-03 DIAGNOSIS — J449 Chronic obstructive pulmonary disease, unspecified: Secondary | ICD-10-CM | POA: Diagnosis not present

## 2016-07-03 DIAGNOSIS — M47816 Spondylosis without myelopathy or radiculopathy, lumbar region: Secondary | ICD-10-CM

## 2016-07-03 DIAGNOSIS — R32 Unspecified urinary incontinence: Secondary | ICD-10-CM | POA: Diagnosis not present

## 2016-07-03 DIAGNOSIS — I5031 Acute diastolic (congestive) heart failure: Secondary | ICD-10-CM | POA: Insufficient documentation

## 2016-07-03 DIAGNOSIS — Z780 Asymptomatic menopausal state: Secondary | ICD-10-CM | POA: Insufficient documentation

## 2016-07-03 DIAGNOSIS — R928 Other abnormal and inconclusive findings on diagnostic imaging of breast: Secondary | ICD-10-CM | POA: Insufficient documentation

## 2016-07-03 DIAGNOSIS — I739 Peripheral vascular disease, unspecified: Secondary | ICD-10-CM | POA: Insufficient documentation

## 2016-07-03 DIAGNOSIS — I2510 Atherosclerotic heart disease of native coronary artery without angina pectoris: Secondary | ICD-10-CM | POA: Insufficient documentation

## 2016-07-03 DIAGNOSIS — F410 Panic disorder [episodic paroxysmal anxiety] without agoraphobia: Secondary | ICD-10-CM | POA: Diagnosis not present

## 2016-07-03 DIAGNOSIS — E7800 Pure hypercholesterolemia, unspecified: Secondary | ICD-10-CM | POA: Diagnosis not present

## 2016-07-03 DIAGNOSIS — D649 Anemia, unspecified: Secondary | ICD-10-CM | POA: Insufficient documentation

## 2016-07-03 DIAGNOSIS — R112 Nausea with vomiting, unspecified: Secondary | ICD-10-CM | POA: Insufficient documentation

## 2016-07-03 DIAGNOSIS — Z79891 Long term (current) use of opiate analgesic: Secondary | ICD-10-CM | POA: Diagnosis not present

## 2016-07-03 DIAGNOSIS — K219 Gastro-esophageal reflux disease without esophagitis: Secondary | ICD-10-CM | POA: Insufficient documentation

## 2016-07-03 DIAGNOSIS — J189 Pneumonia, unspecified organism: Secondary | ICD-10-CM

## 2016-07-03 DIAGNOSIS — M546 Pain in thoracic spine: Secondary | ICD-10-CM | POA: Insufficient documentation

## 2016-07-03 DIAGNOSIS — M810 Age-related osteoporosis without current pathological fracture: Secondary | ICD-10-CM | POA: Diagnosis not present

## 2016-07-03 DIAGNOSIS — M47896 Other spondylosis, lumbar region: Secondary | ICD-10-CM | POA: Insufficient documentation

## 2016-07-03 DIAGNOSIS — F0390 Unspecified dementia without behavioral disturbance: Secondary | ICD-10-CM | POA: Insufficient documentation

## 2016-07-03 DIAGNOSIS — R1310 Dysphagia, unspecified: Secondary | ICD-10-CM | POA: Insufficient documentation

## 2016-07-03 DIAGNOSIS — M8580 Other specified disorders of bone density and structure, unspecified site: Secondary | ICD-10-CM | POA: Insufficient documentation

## 2016-07-03 DIAGNOSIS — G8929 Other chronic pain: Secondary | ICD-10-CM | POA: Insufficient documentation

## 2016-07-03 DIAGNOSIS — R0902 Hypoxemia: Secondary | ICD-10-CM | POA: Insufficient documentation

## 2016-07-03 MED ORDER — PREGABALIN 150 MG PO CAPS: 150.0000 mg | ORAL_CAPSULE | Freq: Three times a day (TID) | ORAL | 2 refills | Status: DC

## 2016-07-03 MED ORDER — TRAMADOL HCL 50 MG PO TABS: 100.0000 mg | ORAL_TABLET | Freq: Four times a day (QID) | ORAL | 0 refills | Status: DC | PRN

## 2016-07-03 MED ORDER — FENTANYL 50 MCG/HR TD PT72: 50.0000 ug | MEDICATED_PATCH | TRANSDERMAL | 0 refills | Status: DC

## 2016-07-03 NOTE — Progress Notes (Signed)
Patient: Cheryl [name] Female    DOB: [birth year]   65 y.o.   MRN: [id number] Visit Date: 07/03/2016  Today's Provider: Mila Merryonald Tashayla Therien, MD   Chief Complaint  Patient presents with  . Hospitalization Follow-up   Subjective:    HPI   Follow up Hospitalization  Patient was admitted to [facility] on 06/02/2016 with progressive cough, fever; admitted with multi-focal bronchopulmonary pneumonia. She reports this condition is Improved. She is now at home. Has mild non-productive cough. No fevers, no dyspnea. Is using inhalers consistently as prescribed.  ----------------------------------------------------------------    Allergies  Allergen Reactions  . Percocet [Oxycodone-Acetaminophen] Hives and Rash  . Aspirin Nausea And Vomiting and Other (See Comments)    Reaction:  GI upset   . Codeine Nausea And Vomiting and Other (See Comments)    Reaction:  GI upset   . Morphine Other (See Comments)    Reaction:  Unknown patient states she had a reaction one time but has not had a reaction since when given morphine.   . Propoxyphene Other (See Comments) and Nausea Only    GI upset Reaction:  GI upset   . Sulfa Antibiotics Rash and Other (See Comments)    Reaction:  GI upset    Current Meds  Medication Sig  . acetaminophen (TYLENOL) 500 MG tablet Take 1,000 mg by mouth every 6 (six) hours as needed for mild pain or headache.   . albuterol (PROVENTIL HFA;VENTOLIN HFA) 108 (90 BASE) MCG/ACT inhaler Inhale 2 puffs into the lungs every 4 (four) hours as needed for wheezing or shortness of breath.  Marland Kitchen. albuterol (PROVENTIL) (2.5 MG/3ML) 0.083% nebulizer solution Take 3 mLs (2.5 mg total) by nebulization every 4 (four) hours as needed for wheezing.  Marland Kitchen. ALPRAZolam (XANAX) 0.25 MG tablet Take 1 tablet (0.25 mg total) by mouth 3 (three) times daily as needed for anxiety.  . clopidogrel (PLAVIX) 75 MG tablet Take 75 mg by mouth daily.  . cyclobenzaprine (FLEXERIL) 5 MG tablet take 1-2 tablets by  mouth every 8 hours if needed  . diphenoxylate-atropine (LOMOTIL) 2.5-0.025 MG per tablet Take 2 tablets by mouth 4 (four) times daily as needed for diarrhea or loose stools. Reported on 03/11/2016  . donepezil (ARICEPT) 10 MG tablet Take 10 mg by mouth at bedtime.  Marland Kitchen. estropipate (OGEN) 0.75 MG tablet Take 1 tablet (0.75 mg total) by mouth daily.  . fentaNYL (DURAGESIC - DOSED MCG/HR) 50 MCG/HR Place 1 patch (50 mcg total) onto the skin every 3 (three) days.  . fluticasone (FLONASE) 50 MCG/ACT nasal spray Place 2 sprays into both nostrils daily.  . Fluticasone-Salmeterol (ADVAIR) 250-50 MCG/DOSE AEPB Inhale 1 puff into the lungs 2 (two) times daily.  . furosemide (LASIX) 40 MG tablet Take 40 mg by mouth daily.  . lansoprazole (PREVACID) 30 MG capsule Take by mouth.  . pantoprazole (PROTONIX) 40 MG tablet Take 40 mg by mouth daily.   . pregabalin (LYRICA) 150 MG capsule Take 1 capsule (150 mg total) by mouth every 8 (eight) hours.  . simvastatin (ZOCOR) 10 MG tablet Take 10 mg by mouth at bedtime. Reported on 12/04/2015  . SUMAtriptan (IMITREX) 25 MG tablet Take 25 mg by mouth as needed for migraine. May repeat in 2 hours if headache persists or recurs.  Marland Kitchen. tiotropium (SPIRIVA) 18 MCG inhalation capsule Place 1 capsule (18 mcg total) into inhaler and inhale daily.  . traMADol (ULTRAM) 50 MG tablet Take 2 tablets (100 mg total) by mouth  every 6 (six) hours as needed for severe pain.    Review of Systems  Constitutional: Negative for appetite change, chills, fatigue and fever.  Respiratory: Positive for shortness of breath and wheezing. Negative for chest tightness.   Cardiovascular: Negative for chest pain and palpitations.  Gastrointestinal: Negative for abdominal pain, nausea and vomiting.  Neurological: Negative for dizziness and weakness.    Social History  Substance Use Topics  . Smoking status: Current Every Day Smoker    Packs/day: 0.50    Years: 50.00    Types: Cigarettes  .  Smokeless tobacco: Never Used  . Alcohol use No   Objective:   BP 120/60 (BP Location: Right Arm, Patient Position: Sitting, Cuff Size: Normal)   Pulse 88   Temp 97.5 F (36.4 C) (Oral)   Resp 16   Ht  (1.549 m)   Wt 97 lb 6.4 oz (44.2 kg)   SpO2 94%   BMI 18.40 kg/m   Physical Exam   General Appearance:    Alert, cooperative, no distress  Eyes:    PERRL, conjunctiva/corneas clear, EOM's intact       Lungs:     Clear to auscultation bilaterally, respirations unlabored  Heart:    Regular rate and rhythm  Neurologic:   Awake, alert, oriented x 3. No apparent focal neurological           defect.           Assessment & Plan:     1. Pneumonia, unspecified laterality, unspecified part of lung Clinically resolved. No additional antibiotics required. Continue current inhalers.   Return in about 4 months (around 11/02/2016).        Mila Merry, MD  [facility]

## 2016-07-03 NOTE — Patient Instructions (Signed)
You were given one prescription for Duragesic patch, one for Tramadol, and one for Lyrica.

## 2016-07-03 NOTE — Progress Notes (Signed)
Safety precautions to be maintained throughout the outpatient stay will include: orient to surroundings, keep bed in low position, maintain call bell within reach at all times, provide assistance with transfer out of bed and ambulation.   Tramadol 50 mg #140 out of 240 remaining. Filled 06-20-16. Fentanyl Patch 50 mcg # 20 out of 20 remaining. Filled 07-02-16.

## 2016-07-05 DIAGNOSIS — J45909 Unspecified asthma, uncomplicated: Secondary | ICD-10-CM | POA: Diagnosis not present

## 2016-07-05 DIAGNOSIS — F419 Anxiety disorder, unspecified: Secondary | ICD-10-CM | POA: Diagnosis not present

## 2016-07-05 DIAGNOSIS — J189 Pneumonia, unspecified organism: Secondary | ICD-10-CM | POA: Diagnosis not present

## 2016-07-05 DIAGNOSIS — G629 Polyneuropathy, unspecified: Secondary | ICD-10-CM | POA: Diagnosis not present

## 2016-07-05 DIAGNOSIS — M810 Age-related osteoporosis without current pathological fracture: Secondary | ICD-10-CM | POA: Diagnosis not present

## 2016-07-05 DIAGNOSIS — I739 Peripheral vascular disease, unspecified: Secondary | ICD-10-CM | POA: Diagnosis not present

## 2016-07-05 DIAGNOSIS — J440 Chronic obstructive pulmonary disease with acute lower respiratory infection: Secondary | ICD-10-CM | POA: Diagnosis not present

## 2016-07-05 DIAGNOSIS — I2510 Atherosclerotic heart disease of native coronary artery without angina pectoris: Secondary | ICD-10-CM | POA: Diagnosis not present

## 2016-07-05 DIAGNOSIS — I10 Essential (primary) hypertension: Secondary | ICD-10-CM | POA: Diagnosis not present

## 2016-07-08 ENCOUNTER — Encounter: Payer: Self-pay | Admitting: *Deleted

## 2016-07-08 ENCOUNTER — Other Ambulatory Visit: Payer: Self-pay | Admitting: *Deleted

## 2016-07-08 DIAGNOSIS — G629 Polyneuropathy, unspecified: Secondary | ICD-10-CM | POA: Diagnosis not present

## 2016-07-08 DIAGNOSIS — M810 Age-related osteoporosis without current pathological fracture: Secondary | ICD-10-CM | POA: Diagnosis not present

## 2016-07-08 DIAGNOSIS — J189 Pneumonia, unspecified organism: Secondary | ICD-10-CM | POA: Diagnosis not present

## 2016-07-08 DIAGNOSIS — I10 Essential (primary) hypertension: Secondary | ICD-10-CM | POA: Diagnosis not present

## 2016-07-08 DIAGNOSIS — J440 Chronic obstructive pulmonary disease with acute lower respiratory infection: Secondary | ICD-10-CM | POA: Diagnosis not present

## 2016-07-08 DIAGNOSIS — J45909 Unspecified asthma, uncomplicated: Secondary | ICD-10-CM | POA: Diagnosis not present

## 2016-07-08 DIAGNOSIS — F419 Anxiety disorder, unspecified: Secondary | ICD-10-CM | POA: Diagnosis not present

## 2016-07-08 DIAGNOSIS — I739 Peripheral vascular disease, unspecified: Secondary | ICD-10-CM | POA: Diagnosis not present

## 2016-07-08 DIAGNOSIS — I2510 Atherosclerotic heart disease of native coronary artery without angina pectoris: Secondary | ICD-10-CM | POA: Diagnosis not present

## 2016-07-08 NOTE — Patient Outreach (Addendum)
[facility]) Care Management   07/08/2016  Oda Placke 14-Nov-1951 [id number]  Pamila Mendibles Kopf is an 65 y.o. female  Subjective:  Patient report feeling good on today, just a little tired because she had physical therapy this am, discussed progress, able to walk to mailbox, with taking rest breaks. Patient discussed that her sister stays with her during the day, Mrs.Bevill discussed how much she enjoying cleaning/straightening  around her house. Patient denies having a recent fall, states she uses her walker all the time and her husband assist her with shower.   Mrs.Goodgame discussed she has lost about 3 pounds since she was discharged from the hospital , patient states she eats one good meal a day at supper, snacks the other parts of day.  Mrs.Wass denies shortness of breath states her breathing is good, denies having to use rescue inhaler and  Denies need for oxygen.       Objective:  BP 108/60   Pulse 84   Resp 18   SpO2 96%   On arrival patient sitting on porch on the seated walker ,smoking . Review of Systems  Constitutional: Negative.   HENT: Negative.   Respiratory: Positive for cough. Negative for shortness of breath.        Coarse sounding cough  Cardiovascular: Positive for leg swelling.       Bilateral lower legs  Gastrointestinal: Negative.   Genitourinary: Negative.   Musculoskeletal: Positive for back pain. Negative for falls.       Positive fall risk, denies recent fall.  Skin: Negative.   Neurological: Negative.   Endo/Heme/Allergies: Negative.     Physical Exam  Constitutional: She is oriented to person, place, and time. She appears well-developed and well-nourished.  Cardiovascular: Normal rate, normal heart sounds and intact distal pulses.   Respiratory: Effort normal.  GI: Soft.  Musculoskeletal: Normal range of motion.  Using rolling walker   Neurological: She is alert and oriented to person, place, and time.  Skin: Skin is  warm and dry.  Psychiatric: She has a normal mood and affect. Her behavior is normal. Judgment and thought content normal.    Encounter Medications:   Outpatient Encounter Prescriptions as of 07/08/2016  Medication Sig Note  . acetaminophen (TYLENOL) 500 MG tablet Take 1,000 mg by mouth every 6 (six) hours as needed for mild pain or headache.    . albuterol (PROVENTIL HFA;VENTOLIN HFA) 108 (90 BASE) MCG/ACT inhaler Inhale 2 puffs into the lungs every 4 (four) hours as needed for wheezing or shortness of breath.   Marland Kitchen albuterol (PROVENTIL) (2.5 MG/3ML) 0.083% nebulizer solution Take 3 mLs (2.5 mg total) by nebulization every 4 (four) hours as needed for wheezing.   Marland Kitchen ALPRAZolam (XANAX) 0.25 MG tablet Take 1 tablet (0.25 mg total) by mouth 3 (three) times daily as needed for anxiety.   . clopidogrel (PLAVIX) 75 MG tablet Take 75 mg by mouth daily. 04/23/2016: Stopped Plavix for procedure on  X 7 - 8 days ago.  . cyclobenzaprine (FLEXERIL) 5 MG tablet take 1-2 tablets by mouth every 8 hours if needed   . donepezil (ARICEPT) 10 MG tablet Take 10 mg by mouth at bedtime.   . fentaNYL (DURAGESIC - DOSED MCG/HR) 50 MCG/HR Place 1 patch (50 mcg total) onto the skin every 3 (three) days.   . fluticasone (FLONASE) 50 MCG/ACT nasal spray Place 2 sprays into both nostrils daily.   . Fluticasone-Salmeterol (ADVAIR) 250-50 MCG/DOSE AEPB Inhale 1 puff into the lungs  2 (two) times daily.   . furosemide (LASIX) 40 MG tablet Take 40 mg by mouth daily.   . pantoprazole (PROTONIX) 40 MG tablet Take 40 mg by mouth daily.    . pregabalin (LYRICA) 150 MG capsule Take 1 capsule (150 mg total) by mouth every 8 (eight) hours.   . simvastatin (ZOCOR) 10 MG tablet Take 10 mg by mouth at bedtime. Reported on 12/04/2015   . SUMAtriptan (IMITREX) 25 MG tablet Take 25 mg by mouth as needed for migraine. May repeat in 2 hours if headache persists or recurs.   Marland Kitchen tiotropium (SPIRIVA) 18 MCG inhalation capsule Place 1 capsule (18 mcg  total) into inhaler and inhale daily.   . traMADol (ULTRAM) 50 MG tablet Take 2 tablets (100 mg total) by mouth every 6 (six) hours as needed for severe pain.   . diphenoxylate-atropine (LOMOTIL) 2.5-0.025 MG per tablet Take 2 tablets by mouth 4 (four) times daily as needed for diarrhea or loose stools. Reported on 03/11/2016   . estropipate (OGEN) 0.75 MG tablet Take 1 tablet (0.75 mg total) by mouth daily.   . lansoprazole (PREVACID) 30 MG capsule Take by mouth. 07/02/2016: Received from: [place]: Take 1 capsule (30 mg total) by mouth once daily.   No facility-administered encounter medications on file as of 07/08/2016.     Functional Status:   In your present state of health, do you have any difficulty performing the following activities: 07/08/2016 06/14/2016  Hearing? N N  Vision? N N  Difficulty concentrating or making decisions? N Y  Walking or climbing stairs? Y Y  Dressing or bathing? Y Y  Doing errands, shopping? [name]  Preparing Food and eating ? Y Y  Using the Toilet? N N  In the past six months, have you accidently leaked urine? Y Y  Do you have problems with loss of bowel control? Y Y  Managing your Medications? Y Y  Managing your Finances? [name]  Housekeeping or managing your Housekeeping? N N  Some recent data might be hidden    Fall/Depression Screening:    PHQ 2/9 Scores 07/08/2016 07/03/2016 06/17/2016 06/14/2016 05/06/2016 03/20/2016 03/11/2016  PHQ - 2 Score 0 0 0 0 0 0 0    Assessment:    Mrs.Ebright states he husband organizes her medication for her, Patient was recently discharged from hospital and all medications have been reviewed.  Fall Risk-  Positive fall risk, reviewed fall preventions measures  Nutrition and weight loss- patient with 3 pound weight loss since discharge  at recent office visit weighted 97 pounds patient has scales at home does not weigh on a regular basis. Daily diet includes many snack items candy , chips, cookies  daily, patient not interested in trying nutrition drinks such as boost or ensure. Reviewed suggestions for healthy snack options such as yogurt.  COPD/Recent Pneumonia  patient report her normal state of breathing, continues to smokes,declines wanting to quit . Reviewed needs for close monitoring of COPD yellow zone symptoms and when to call .       Plan:  Patient will receive weekly transition of care call on next week Encouraged patient to follow fall risk observations.  Placed call to [facility] for well dine meals Provided and reviewed EMMI handouts on  COPD, when to call and Pneumonia , reviewed COPD yellow zone symptoms of when to call .     [facility] CM Care Plan Problem One   Flowsheet Row Most Recent Value  Care Plan Problem One  Knowledge deficit related to self care management of COPD as evidenced by recent hospital admission  Role Documenting the Problem One  Care Management [place] for Problem One  Active  [facility] Long Term Goal (31-90 days)  Patient will not experience a hospital readmission in the next 31 days  [facility] Long Term Goal Start Date  06/17/16  Interventions for Problem One Long Term Goal  Home visit completed, reviewed weekly telephone outreachs to continue, reviewed EMMI handout of Pneumonia signs,symptoms,    [facility] CM Short Term Goal #1 (0-30 days)  Patient will schedule and attend PCP appointment in the next 14 days  [facility] CM Short Term Goal #1 Start Date  06/17/16  [facility] CM Short Term Goal #1 Met Date  07/08/16  [facility] CM Short Term Goal #2 (0-30 days)  Patient will be able to state at least 3 symptoms in yellow zone and action plan to take in the next 30 days  [facility] CM Short Term Goal #2 Start Date  06/17/16  Interventions for Short Term Goal #2  Reinforced with teachback , yellow zone symptoms , provided EMMI handout on Copd , patient to read, will follow up with teachback next visit.   [facility] CM Short Term Goal #3 (0-30 days)  Patient will begin to weigh weekly in the  next 30 days   [facility] CM Short Term Goal #3 Start Date  07/08/16  Interventions for Short Tern Goal #3  Discussed importance of keeping track of weight ,to monitor for further weight loss.      Joylene Draft, RN, [place] [facility] [phone]- Mobile [phone]- Toll Free Main Office

## 2016-07-09 ENCOUNTER — Ambulatory Visit: Payer: Commercial Managed Care - HMO | Admitting: *Deleted

## 2016-07-11 DIAGNOSIS — J189 Pneumonia, unspecified organism: Secondary | ICD-10-CM | POA: Diagnosis not present

## 2016-07-11 DIAGNOSIS — I10 Essential (primary) hypertension: Secondary | ICD-10-CM | POA: Diagnosis not present

## 2016-07-11 DIAGNOSIS — J440 Chronic obstructive pulmonary disease with acute lower respiratory infection: Secondary | ICD-10-CM | POA: Diagnosis not present

## 2016-07-11 DIAGNOSIS — M6281 Muscle weakness (generalized): Secondary | ICD-10-CM | POA: Diagnosis not present

## 2016-07-11 DIAGNOSIS — J45909 Unspecified asthma, uncomplicated: Secondary | ICD-10-CM | POA: Diagnosis not present

## 2016-07-11 DIAGNOSIS — G629 Polyneuropathy, unspecified: Secondary | ICD-10-CM | POA: Diagnosis not present

## 2016-07-11 DIAGNOSIS — F419 Anxiety disorder, unspecified: Secondary | ICD-10-CM | POA: Diagnosis not present

## 2016-07-11 DIAGNOSIS — I739 Peripheral vascular disease, unspecified: Secondary | ICD-10-CM | POA: Diagnosis not present

## 2016-07-11 DIAGNOSIS — M810 Age-related osteoporosis without current pathological fracture: Secondary | ICD-10-CM | POA: Diagnosis not present

## 2016-07-11 DIAGNOSIS — I2510 Atherosclerotic heart disease of native coronary artery without angina pectoris: Secondary | ICD-10-CM | POA: Diagnosis not present

## 2016-07-11 DIAGNOSIS — J449 Chronic obstructive pulmonary disease, unspecified: Secondary | ICD-10-CM | POA: Diagnosis not present

## 2016-07-15 DIAGNOSIS — K219 Gastro-esophageal reflux disease without esophagitis: Secondary | ICD-10-CM | POA: Diagnosis not present

## 2016-07-15 DIAGNOSIS — R197 Diarrhea, unspecified: Secondary | ICD-10-CM | POA: Diagnosis not present

## 2016-07-15 DIAGNOSIS — R159 Full incontinence of feces: Secondary | ICD-10-CM | POA: Diagnosis not present

## 2016-07-16 ENCOUNTER — Other Ambulatory Visit: Payer: Self-pay | Admitting: *Deleted

## 2016-07-16 DIAGNOSIS — I739 Peripheral vascular disease, unspecified: Secondary | ICD-10-CM | POA: Diagnosis not present

## 2016-07-16 DIAGNOSIS — G609 Hereditary and idiopathic neuropathy, unspecified: Secondary | ICD-10-CM | POA: Diagnosis not present

## 2016-07-16 DIAGNOSIS — J449 Chronic obstructive pulmonary disease, unspecified: Secondary | ICD-10-CM | POA: Diagnosis not present

## 2016-07-16 DIAGNOSIS — I10 Essential (primary) hypertension: Secondary | ICD-10-CM | POA: Diagnosis not present

## 2016-07-16 DIAGNOSIS — N390 Urinary tract infection, site not specified: Secondary | ICD-10-CM | POA: Diagnosis not present

## 2016-07-16 DIAGNOSIS — I2510 Atherosclerotic heart disease of native coronary artery without angina pectoris: Secondary | ICD-10-CM | POA: Diagnosis not present

## 2016-07-16 DIAGNOSIS — R110 Nausea: Secondary | ICD-10-CM | POA: Diagnosis not present

## 2016-07-16 DIAGNOSIS — K219 Gastro-esophageal reflux disease without esophagitis: Secondary | ICD-10-CM | POA: Diagnosis not present

## 2016-07-16 NOTE — Patient Outreach (Signed)
[facility]) Care Management  07/16/2016  Sueanne MargaritaMary George Bencivenga 01-24-1951 [id number]  Transition of care call  Spoke with [name], patient reports that she is doing fine on today. Patient denies any recent episodes of increased shortness of breath, cough or sputum productions.  Patient discussed her visit with GI doctor on today and states her weight was 93, and last week she weighted 97 lbs at PCP visit. Patient reports her husband states she just doesn't eat enough. Patient discussed she eats snacks throughout the day, peanut butter crackers, chips, cookies, grapes, and eats a good dinner. Patient declines trying boost/ensure type drinks to supplement her diet, discussed importance of a balanced diet to help with healing and preventing problems. Patient states she is had a hotdog and fries on today, out to lunch family.. Patient may benefit from continued involvement from community care manager post transition of care ,prior to transition back to health coach,  high risk for readmission, discussed with patient will let me know at next visit  Patient discussed she has cardiology follow up appointment in am.   Plan Will follow up with patient on next week as part of the transition of care program.  Egbert Garibaldi/Kimberly Glover, RN, [facility] Care Management [phone]- Mobile [phone]- Toll Free Main Office

## 2016-07-19 DIAGNOSIS — G629 Polyneuropathy, unspecified: Secondary | ICD-10-CM | POA: Diagnosis not present

## 2016-07-19 DIAGNOSIS — J440 Chronic obstructive pulmonary disease with acute lower respiratory infection: Secondary | ICD-10-CM | POA: Diagnosis not present

## 2016-07-19 DIAGNOSIS — M810 Age-related osteoporosis without current pathological fracture: Secondary | ICD-10-CM | POA: Diagnosis not present

## 2016-07-19 DIAGNOSIS — I739 Peripheral vascular disease, unspecified: Secondary | ICD-10-CM | POA: Diagnosis not present

## 2016-07-19 DIAGNOSIS — I2510 Atherosclerotic heart disease of native coronary artery without angina pectoris: Secondary | ICD-10-CM | POA: Diagnosis not present

## 2016-07-19 DIAGNOSIS — I10 Essential (primary) hypertension: Secondary | ICD-10-CM | POA: Diagnosis not present

## 2016-07-19 DIAGNOSIS — F419 Anxiety disorder, unspecified: Secondary | ICD-10-CM | POA: Diagnosis not present

## 2016-07-19 DIAGNOSIS — J189 Pneumonia, unspecified organism: Secondary | ICD-10-CM | POA: Diagnosis not present

## 2016-07-19 DIAGNOSIS — J45909 Unspecified asthma, uncomplicated: Secondary | ICD-10-CM | POA: Diagnosis not present

## 2016-07-22 DIAGNOSIS — I2510 Atherosclerotic heart disease of native coronary artery without angina pectoris: Secondary | ICD-10-CM | POA: Diagnosis not present

## 2016-07-22 DIAGNOSIS — I10 Essential (primary) hypertension: Secondary | ICD-10-CM | POA: Diagnosis not present

## 2016-07-22 DIAGNOSIS — F17200 Nicotine dependence, unspecified, uncomplicated: Secondary | ICD-10-CM | POA: Diagnosis not present

## 2016-07-22 DIAGNOSIS — J449 Chronic obstructive pulmonary disease, unspecified: Secondary | ICD-10-CM | POA: Diagnosis not present

## 2016-07-22 DIAGNOSIS — I739 Peripheral vascular disease, unspecified: Secondary | ICD-10-CM | POA: Diagnosis not present

## 2016-07-22 DIAGNOSIS — R0602 Shortness of breath: Secondary | ICD-10-CM | POA: Diagnosis not present

## 2016-07-22 DIAGNOSIS — K210 Gastro-esophageal reflux disease with esophagitis: Secondary | ICD-10-CM | POA: Diagnosis not present

## 2016-07-23 ENCOUNTER — Other Ambulatory Visit: Payer: Self-pay | Admitting: *Deleted

## 2016-07-23 ENCOUNTER — Encounter: Payer: Self-pay | Admitting: *Deleted

## 2016-07-23 DIAGNOSIS — J441 Chronic obstructive pulmonary disease with (acute) exacerbation: Secondary | ICD-10-CM

## 2016-07-23 NOTE — Patient Outreach (Signed)
[facility]) Care Management  07/23/2016  Cheryl MargaritaMary George [name] Apr 14, 1951 [id number]  Final transition of care call  Placed call to [name] , reports she is doing just fine, denies increase in shortness of breath, cough or sputum production changes. Patient states that she has not had to use her oxygen recently and continue to take her medications as prescribed.  [name] discussed her recent visit with GI and Cardiology, patient reports her weight is down to 93 pounds she states she does not want to weigh at home , and that she is trying to eat more.,Discussed importance of balanced nutrition and protein  in preventing infection, reviewed foods to include in diet. Patient states she had a brownie on today, not much of a breakfast eater.   [name] had declined wanting to pursue , pulmonary rehab or smoking cessation. Home health physical therapy last visit will be on Friday.  [name] has completed transition of care program, no readmissions, patient has consistent MD follow up.  I have discussed continued [facility] as patient is high risk for readmission , patient has declined, prefers to continue telephonic follow up by health coach.   Plan Will plan transition back to health coach. Will send discipline closure letter.   Egbert GaribaldiKimberly Glover, RN, [facility] Care Management [phone]- Mobile [phone]- Toll Free Main Office

## 2016-07-26 DIAGNOSIS — G629 Polyneuropathy, unspecified: Secondary | ICD-10-CM | POA: Diagnosis not present

## 2016-07-26 DIAGNOSIS — J440 Chronic obstructive pulmonary disease with acute lower respiratory infection: Secondary | ICD-10-CM | POA: Diagnosis not present

## 2016-07-26 DIAGNOSIS — I739 Peripheral vascular disease, unspecified: Secondary | ICD-10-CM | POA: Diagnosis not present

## 2016-07-26 DIAGNOSIS — I2510 Atherosclerotic heart disease of native coronary artery without angina pectoris: Secondary | ICD-10-CM | POA: Diagnosis not present

## 2016-07-26 DIAGNOSIS — J189 Pneumonia, unspecified organism: Secondary | ICD-10-CM | POA: Diagnosis not present

## 2016-07-26 DIAGNOSIS — M810 Age-related osteoporosis without current pathological fracture: Secondary | ICD-10-CM | POA: Diagnosis not present

## 2016-07-26 DIAGNOSIS — J45909 Unspecified asthma, uncomplicated: Secondary | ICD-10-CM | POA: Diagnosis not present

## 2016-07-26 DIAGNOSIS — I10 Essential (primary) hypertension: Secondary | ICD-10-CM | POA: Diagnosis not present

## 2016-07-26 DIAGNOSIS — F419 Anxiety disorder, unspecified: Secondary | ICD-10-CM | POA: Diagnosis not present

## 2016-08-02 DIAGNOSIS — J45909 Unspecified asthma, uncomplicated: Secondary | ICD-10-CM | POA: Diagnosis not present

## 2016-08-02 DIAGNOSIS — F419 Anxiety disorder, unspecified: Secondary | ICD-10-CM | POA: Diagnosis not present

## 2016-08-02 DIAGNOSIS — J189 Pneumonia, unspecified organism: Secondary | ICD-10-CM | POA: Diagnosis not present

## 2016-08-02 DIAGNOSIS — M810 Age-related osteoporosis without current pathological fracture: Secondary | ICD-10-CM | POA: Diagnosis not present

## 2016-08-02 DIAGNOSIS — I2510 Atherosclerotic heart disease of native coronary artery without angina pectoris: Secondary | ICD-10-CM | POA: Diagnosis not present

## 2016-08-02 DIAGNOSIS — I10 Essential (primary) hypertension: Secondary | ICD-10-CM | POA: Diagnosis not present

## 2016-08-02 DIAGNOSIS — I739 Peripheral vascular disease, unspecified: Secondary | ICD-10-CM | POA: Diagnosis not present

## 2016-08-02 DIAGNOSIS — J440 Chronic obstructive pulmonary disease with acute lower respiratory infection: Secondary | ICD-10-CM | POA: Diagnosis not present

## 2016-08-02 DIAGNOSIS — G629 Polyneuropathy, unspecified: Secondary | ICD-10-CM | POA: Diagnosis not present

## 2016-08-08 DIAGNOSIS — J189 Pneumonia, unspecified organism: Secondary | ICD-10-CM | POA: Diagnosis not present

## 2016-08-08 DIAGNOSIS — J45909 Unspecified asthma, uncomplicated: Secondary | ICD-10-CM | POA: Diagnosis not present

## 2016-08-08 DIAGNOSIS — G629 Polyneuropathy, unspecified: Secondary | ICD-10-CM | POA: Diagnosis not present

## 2016-08-08 DIAGNOSIS — F419 Anxiety disorder, unspecified: Secondary | ICD-10-CM | POA: Diagnosis not present

## 2016-08-08 DIAGNOSIS — M810 Age-related osteoporosis without current pathological fracture: Secondary | ICD-10-CM | POA: Diagnosis not present

## 2016-08-08 DIAGNOSIS — I2510 Atherosclerotic heart disease of native coronary artery without angina pectoris: Secondary | ICD-10-CM | POA: Diagnosis not present

## 2016-08-08 DIAGNOSIS — J440 Chronic obstructive pulmonary disease with acute lower respiratory infection: Secondary | ICD-10-CM | POA: Diagnosis not present

## 2016-08-08 DIAGNOSIS — I739 Peripheral vascular disease, unspecified: Secondary | ICD-10-CM | POA: Diagnosis not present

## 2016-08-08 DIAGNOSIS — I10 Essential (primary) hypertension: Secondary | ICD-10-CM | POA: Diagnosis not present

## 2016-08-11 DIAGNOSIS — J449 Chronic obstructive pulmonary disease, unspecified: Secondary | ICD-10-CM | POA: Diagnosis not present

## 2016-08-11 DIAGNOSIS — M6281 Muscle weakness (generalized): Secondary | ICD-10-CM | POA: Diagnosis not present

## 2016-08-15 ENCOUNTER — Other Ambulatory Visit: Payer: Self-pay | Admitting: Family Medicine

## 2016-08-15 ENCOUNTER — Telehealth: Payer: Self-pay | Admitting: Pain Medicine

## 2016-08-15 NOTE — Telephone Encounter (Signed)
Patient's husband called stating she does not have enough meds to last until her sched appt and they dont have another script for refill, please check to see if all scripts were printed out and call patient to let her know

## 2016-08-15 NOTE — Telephone Encounter (Signed)
Please schedule for med refill for Tramadol.  Patient does not have enough to last till refill appointment.

## 2016-08-16 DIAGNOSIS — G629 Polyneuropathy, unspecified: Secondary | ICD-10-CM | POA: Diagnosis not present

## 2016-08-16 DIAGNOSIS — M810 Age-related osteoporosis without current pathological fracture: Secondary | ICD-10-CM | POA: Diagnosis not present

## 2016-08-16 DIAGNOSIS — R110 Nausea: Secondary | ICD-10-CM | POA: Diagnosis not present

## 2016-08-16 DIAGNOSIS — F419 Anxiety disorder, unspecified: Secondary | ICD-10-CM | POA: Diagnosis not present

## 2016-08-16 DIAGNOSIS — J189 Pneumonia, unspecified organism: Secondary | ICD-10-CM | POA: Diagnosis not present

## 2016-08-16 DIAGNOSIS — I739 Peripheral vascular disease, unspecified: Secondary | ICD-10-CM | POA: Diagnosis not present

## 2016-08-16 DIAGNOSIS — J440 Chronic obstructive pulmonary disease with acute lower respiratory infection: Secondary | ICD-10-CM | POA: Diagnosis not present

## 2016-08-16 DIAGNOSIS — G609 Hereditary and idiopathic neuropathy, unspecified: Secondary | ICD-10-CM | POA: Diagnosis not present

## 2016-08-16 DIAGNOSIS — J449 Chronic obstructive pulmonary disease, unspecified: Secondary | ICD-10-CM | POA: Diagnosis not present

## 2016-08-16 DIAGNOSIS — J45909 Unspecified asthma, uncomplicated: Secondary | ICD-10-CM | POA: Diagnosis not present

## 2016-08-16 DIAGNOSIS — I2510 Atherosclerotic heart disease of native coronary artery without angina pectoris: Secondary | ICD-10-CM | POA: Diagnosis not present

## 2016-08-16 DIAGNOSIS — I10 Essential (primary) hypertension: Secondary | ICD-10-CM | POA: Diagnosis not present

## 2016-08-16 DIAGNOSIS — N390 Urinary tract infection, site not specified: Secondary | ICD-10-CM | POA: Diagnosis not present

## 2016-08-20 ENCOUNTER — Encounter: Payer: Self-pay | Admitting: Pain Medicine

## 2016-08-20 ENCOUNTER — Ambulatory Visit: Payer: Commercial Managed Care - HMO | Attending: Pain Medicine | Admitting: Pain Medicine

## 2016-08-20 VITALS — BP 132/58 | HR 88 | Temp 98.1°F | Resp 16 | Ht 61.0 in | Wt 96.0 lb

## 2016-08-20 DIAGNOSIS — F17210 Nicotine dependence, cigarettes, uncomplicated: Secondary | ICD-10-CM | POA: Diagnosis not present

## 2016-08-20 DIAGNOSIS — M797 Fibromyalgia: Secondary | ICD-10-CM

## 2016-08-20 DIAGNOSIS — Z79891 Long term (current) use of opiate analgesic: Secondary | ICD-10-CM | POA: Insufficient documentation

## 2016-08-20 DIAGNOSIS — M47816 Spondylosis without myelopathy or radiculopathy, lumbar region: Secondary | ICD-10-CM

## 2016-08-20 DIAGNOSIS — G8929 Other chronic pain: Secondary | ICD-10-CM | POA: Diagnosis not present

## 2016-08-20 DIAGNOSIS — F1190 Opioid use, unspecified, uncomplicated: Secondary | ICD-10-CM

## 2016-08-20 DIAGNOSIS — M545 Low back pain: Secondary | ICD-10-CM

## 2016-08-20 MED ORDER — FENTANYL 50 MCG/HR TD PT72: 50.0000 ug | MEDICATED_PATCH | TRANSDERMAL | 0 refills | Status: DC

## 2016-08-20 MED ORDER — PREGABALIN 150 MG PO CAPS: 150.0000 mg | ORAL_CAPSULE | Freq: Three times a day (TID) | ORAL | 1 refills | Status: DC

## 2016-08-20 MED ORDER — TRAMADOL HCL 50 MG PO TABS: 100.0000 mg | ORAL_TABLET | Freq: Four times a day (QID) | ORAL | 1 refills | Status: DC | PRN

## 2016-08-20 MED ORDER — TRAMADOL HCL 50 MG PO TABS: 100.0000 mg | ORAL_TABLET | Freq: Four times a day (QID) | ORAL | 4 refills | Status: DC | PRN

## 2016-08-20 NOTE — Progress Notes (Signed)
Patient's Name: Cheryl [name]  MRN: [id number]  Referring Provider: Malva Limes, MD  DOB: [birth year]  PCP: Mila Merry, MD  DOS: 08/20/2016  Note by: Sydnee Levans. Laban Emperor, MD  Service setting: Ambulatory outpatient  Specialty: Interventional Pain Management  Location: [facility] (AMB) Pain Management Facility    Patient type: Established   Primary Reason(s) for Visit: Encounter for prescription drug management (Level of risk: moderate) CC: Back Pain (lower)  HPI  Cheryl [name] is a 65 y.o. year old, female patient, who comes today for an initial evaluation. She has Essential hypertension; GERD (gastroesophageal reflux disease); Hyperlipemia; Anxiety; Airway hyperreactivity; Back pain, thoracic; Coronary artery disease; Excessive falling; Alteration in bowel elimination: incontinence; Insomnia; Decreased testosterone level; Leg weakness; Menopausal symptom; Migraine; Neuropathy (HCC); Fecal occult blood test positive; OP (osteoporosis); Panic disorder; Compulsive tobacco user syndrome; Urinary incontinence; Vitamin D deficiency; Weight loss; COPD (chronic obstructive pulmonary disease) (HCC); Dementia; Lumbar radicular pain; Chronic low back pain; Encounter for therapeutic drug level monitoring; Uncomplicated opioid dependence (HCC); Chronic pain syndrome; Hypercholesteremia; Peripheral nerve disease (HCC); Peripheral vascular disease (HCC); Platelet inhibition due to Plavix; Abnormal mammogram of right breast; Dilated intrahepatic bile duct; Dysphagia; Acute diastolic CHF (congestive heart failure) (HCC); Long term current use of opiate analgesic; Chronic pain; Fibromyalgia; Osteoarthrosis; Long term current use of anticoagulant therapy (Plavix); Long term prescription opiate use; Opiate use (160 MME/Day); Anemia; Lumbar facet syndrome (Location of Primary Source of Pain) (Bilateral); Lumbar spondylosis; Pneumonia; and Nausea with vomiting on her problem list.. Her primarily concern today is the Back Pain  (lower)  Pain Assessment: Self-Reported Pain Score: 3 /10             Reported level is compatible with observation.       Pain Type: Chronic pain Pain Location: Back Pain Orientation: Lower, Right, Left (worse on left) Pain Descriptors / Indicators:  (steady) Pain Frequency: Constant  The patient comes into the clinics today for pharmacological management of her chronic pain. I last saw this patient on 08/15/2016. The patient  reports that she does not use drugs. Her body mass index is 18.14 kg/m.  Date of Last Visit: 07/03/16 Service Provided on Last Visit: Med Refill  Controlled Substance Pharmacotherapy Assessment & REMS (Risk Evaluation and Mitigation Strategy)  Analgesic:Duragesic 50 mcg/h every 72 hours + Tramadol 100 mg PO q6hrs (400 mg/day) MME/day:160 mg/day. Pill Count: Did not bring medication bottles to this appointment. Patient and son reminded that should this happen again, there will be no refills. Pharmacokinetics: Onset of action (Liberation/Absorption): Within expected pharmacological parameters Time to Peak effect (Distribution): Timing and results are as within normal expected parameters Duration of action (Metabolism/Excretion): Within normal limits for medication Pharmacodynamics: Analgesic Effect: More than 50% Activity Facilitation: Medication(s) allow patient to sit, stand, walk, and do the basic ADLs Perceived Effectiveness: Described as relatively effective, allowing for increase in activities of daily living (ADL) Side-effects or Adverse reactions: None reported Monitoring: [place] PMP: Online review of the past 51-month period conducted. Compliant with practice rules and regulations List of all UDS test(s) done:  Lab Results  Component Value Date   TOXASSSELUR FINAL 03/20/2016   TOXASSSELUR FINAL 02/28/2016   TOXASSSELUR FINAL 12/04/2015   Last UDS on record: ToxAssure Select 13  Date Value Ref Range Status  03/20/2016 FINAL  Final    Comment:     ==================================================================== TOXASSURE SELECT 13 (MW) ==================================================================== Test  Result       Flag       Units Drug Present and Declared for Prescription Verification   Fentanyl                       57           EXPECTED   ng/mg creat   Norfentanyl                    >485         EXPECTED   ng/mg creat    Source of fentanyl is a scheduled prescription medication,    including IV, patch, and transmucosal formulations. Norfentanyl    is an expected metabolite of fentanyl.   Tramadol                       PRESENT      EXPECTED   O-Desmethyltramadol            PRESENT      EXPECTED   N-Desmethyltramadol            PRESENT      EXPECTED    Source of tramadol is a prescription medication.    O-desmethyltramadol and N-desmethyltramadol are expected    metabolites of tramadol. ==================================================================== Test                      Result    Flag   Units      Ref Range   Creatinine              206              mg/dL      >=40 ==================================================================== Declared Medications:  The flagging and interpretation on this report are based on the  following declared medications.  Unexpected results may arise from  inaccuracies in the declared medications.  **Note: The testing scope of this panel includes these medications:  Fentanyl (Duragesic)  Tramadol (Ultram)  **Note: The testing scope of this panel does not include following  reported medications:  Atropine (Lomotil)  Diphenoxylate (Lomotil)  Donepezil (Aricept)  Estropipate (Ogen)  Fluticasone (Advair)  Furosemide (Lasix)  Pantoprazole (Protonix)  Pregabalin (Lyrica)  Salmeterol (Advair)  Simvastatin (Zocor)  Sumatriptan (Imitrex)  Tiotropium (Spiriva) ==================================================================== For clinical  consultation, please call [phone]. ====================================================================    UDS interpretation: Compliant          Medication Assessment Form: Reviewed. Patient indicates being compliant with therapy Treatment compliance: Compliant Risk Assessment: Aberrant Behavior: None observed today Substance Use Disorder (SUD) Risk Level: Low-to-moderate Risk of opioid abuse or dependence: 0.7-3.0% with doses ? 36 MME/day and 6.1-26% with doses ? 120 MME/day. Opioid Risk Tool (ORT) Score: 6   Moderate Risk for SUD (Score between 4-7) Depression Scale Score: PHQ-2: 0   No depression (0) PHQ-9: 0   No depression (0-4)  Pharmacologic Plan: No change in therapy, at this time  Laboratory Chemistry  Inflammation Markers Lab Results  Component Value Date   ESRSEDRATE 52 (H) 03/01/2016   CRP 0.6 03/01/2016   Renal Function Lab Results  Component Value Date   BUN 25 (H) 06/05/2016   CREATININE 0.68 06/05/2016   GFRAA >60 06/05/2016   GFRNONAA >60 06/05/2016   Hepatic Function Lab Results  Component Value Date   AST 28 06/02/2016   ALT 14 06/02/2016   ALBUMIN 3.3 (L) 06/02/2016   Electrolytes Lab Results  Component Value Date   NA 138 06/05/2016   K 4.0 06/05/2016   CL 99 (L) 06/05/2016   CALCIUM 8.9 06/05/2016   MG 1.9 06/04/2016   Pain Modulating Vitamins Lab Results  Component Value Date   VD25OH 25.6 (L) 01/17/2016   VITAMINB12 242 03/01/2016   Coagulation Parameters Lab Results  Component Value Date   INR 1.08 10/17/2015   LABPROT 14.2 10/17/2015   APTT 38 (H) 10/17/2015   PLT 252 06/05/2016   Cardiovascular Lab Results  Component Value Date   HGB 11.9 (L) 06/05/2016   HCT 34.7 (L) 06/05/2016    Note: Lab results reviewed.  Recent Diagnostic Imaging  Ct Chest W Contrast  Result Date: 06/03/2016 CLINICAL DATA:  Fever, hypoxia, altered mental status. Nausea. Poor p.o. intake. EXAM: CT CHEST WITH CONTRAST TECHNIQUE:  Multidetector CT imaging of the chest was performed during intravenous contrast administration. CONTRAST:  75mL ISOVUE-300 IOPAMIDOL (ISOVUE-300) INJECTION 61% COMPARISON:  10/18/2015 FINDINGS: Normal heart size. Normal caliber thoracic aorta. No aortic dissection. Great vessel origins are patent. Scattered lymph nodes throughout the mediastinum and in both hila are prominent without pathologic enlargement, likely reactive. Similar appearance to previous study. Subcarinal lymph nodes today are measuring 15 mm compared with 14.4 mm previously. Moderate-sized esophageal hiatal hernia. No esophageal dilatation. Mild emphysematous changes in the lungs with scattered peripheral fibrosis. Focal areas of nodular peribronchial infiltration demonstrated in the right middle lung and left lower lung. Bronchial wall thickening demonstrated bilaterally. Changes likely to represent multifocal bronchopneumonia. Airways appear to be patent. Small air cyst in the right lung base. No pleural effusions. No pneumothorax. Upper abdominal organs demonstrate mild central intrahepatic bile duct dilatation of nonspecific etiology. Correlate with liver function studies. Degenerative changes throughout the spine. IMPRESSION: Focal areas of nodular peribronchial infiltration demonstrated in both lungs with bronchial wall thickening diffusely likely to represent multifocal bronchopneumonia. Electronically Signed   By: Burman Nieves M.D.   On: 06/03/2016 01:34   Dg Chest Port 1 View  Result Date: 06/02/2016 CLINICAL DATA:  65 year old female with fever EXAM: PORTABLE CHEST 1 VIEW COMPARISON:  Chest radiograph dated 11/22/2015 FINDINGS: Single portable view of chest demonstrate a emphysematous changes of the lungs. There is no focal consolidation. No pleural effusion or pneumothorax. Mild cardiomegaly. There is osteopenia with degenerative changes of the spine. No acute fracture. IMPRESSION: No active disease. Electronically Signed   By:  Elgie Collard M.D.   On: 06/02/2016 22:45   Meds  The patient has a current medication list which includes the following prescription(s): acetaminophen, advair diskus, albuterol, albuterol, alprazolam, clopidogrel, cyclobenzaprine, diphenoxylate-atropine, donepezil, estropipate, fentanyl, fentanyl, fluticasone, furosemide, lansoprazole, pantoprazole, pregabalin, simvastatin, sumatriptan, tiotropium, and tramadol.  Current Outpatient Prescriptions on File Prior to Visit  Medication Sig  . acetaminophen (TYLENOL) 500 MG tablet Take 1,000 mg by mouth every 6 (six) hours as needed for mild pain or headache.   . ADVAIR DISKUS 250-50 MCG/DOSE AEPB inhale 1 dose by mouth twice a day  . albuterol (PROVENTIL HFA;VENTOLIN HFA) 108 (90 BASE) MCG/ACT inhaler Inhale 2 puffs into the lungs every 4 (four) hours as needed for wheezing or shortness of breath.  Marland Kitchen albuterol (PROVENTIL) (2.5 MG/3ML) 0.083% nebulizer solution Take 3 mLs (2.5 mg total) by nebulization every 4 (four) hours as needed for wheezing.  . clopidogrel (PLAVIX) 75 MG tablet Take 75 mg by mouth daily.  . cyclobenzaprine (FLEXERIL) 5 MG tablet take 1-2 tablets by mouth every 8 hours if needed  . diphenoxylate-atropine (LOMOTIL) 2.5-0.025  MG per tablet Take 2 tablets by mouth 4 (four) times daily as needed for diarrhea or loose stools. Reported on 03/11/2016  . donepezil (ARICEPT) 10 MG tablet Take 10 mg by mouth at bedtime.  Marland Kitchen. estropipate (OGEN) 0.75 MG tablet Take 1 tablet (0.75 mg total) by mouth daily.  . fluticasone (FLONASE) 50 MCG/ACT nasal spray Place 2 sprays into both nostrils daily.  . furosemide (LASIX) 40 MG tablet Take 40 mg by mouth daily.  . lansoprazole (PREVACID) 30 MG capsule Take by mouth.  . pantoprazole (PROTONIX) 40 MG tablet Take 40 mg by mouth daily.   . simvastatin (ZOCOR) 10 MG tablet Take 10 mg by mouth at bedtime. Reported on 12/04/2015  . SUMAtriptan (IMITREX) 25 MG tablet Take 25 mg by mouth as needed for migraine.  May repeat in 2 hours if headache persists or recurs.  Marland Kitchen. tiotropium (SPIRIVA) 18 MCG inhalation capsule Place 1 capsule (18 mcg total) into inhaler and inhale daily.   No current facility-administered medications on file prior to visit.    ROS  Constitutional: Denies any fever or chills Gastrointestinal: No reported hemesis, hematochezia, vomiting, or acute GI distress Musculoskeletal: Denies any acute onset joint swelling, redness, loss of ROM, or weakness Neurological: No reported episodes of acute onset apraxia, aphasia, dysarthria, agnosia, amnesia, paralysis, loss of coordination, or loss of consciousness  Allergies  [name] is allergic to percocet [oxycodone-acetaminophen]; aspirin; codeine; propoxyphene; and sulfa antibiotics.  PFSH  Medical:  [name]  has a past medical history of Allergy; Anxiety; Arthritis; Aspiration pneumonitis (HCC) (11/24/2015); Asthma; CHF (congestive heart failure) (HCC); Chronic pain; COPD (chronic obstructive pulmonary disease) (HCC); Coronary artery disease; DVT (deep venous thrombosis) (HCC); GERD (gastroesophageal reflux disease); Headache; Hyperlipidemia; Hypertension; Migraines; Neuropathy (HCC) (2010); Osteoporosis; Oxygen deficiency; Peripheral vascular disease (HCC); Pneumonia; Pneumonia (11/19/2015); and Vitamin D deficiency. Family: family history includes Arthritis in her mother; Cancer in her brother, brother, and mother; Diabetes in her brother, mother, and sister; Heart disease in her father and mother. Surgical:  has a past surgical history that includes Appendectomy; Spine surgery; Foot surgery (Bilateral); cardiac catherization (10/31/2009); abdomnal aortic stent (05/30/2008); Abdominal hysterectomy (1975); Cholecystectomy (1972); Cervical fusion (C5 - 6/C6-7); Appendectomy; Colonoscopy with propofol (N/A, 07/27/2015); and Esophagogastroduodenoscopy (egd) with propofol (N/A, 07/27/2015). Tobacco:  reports that she has been smoking Cigarettes.   She has a 25.00 pack-year smoking history. She has never used smokeless tobacco. Alcohol:  reports that she does not drink alcohol. Drug:  reports that she does not use drugs.  Constitutional Exam  General appearance: Well nourished, well developed, and well hydrated. In no acute distress Vitals:   08/20/16 1348  BP: (!) 132/58  Pulse: 88  Resp: 16  Temp: 98.1 F (36.7 C)  TempSrc: Oral  SpO2: 98%  Weight: 96 lb (43.5 kg)  Height: 5\' 1"  (1.549 m)  BMI Assessment: Estimated body mass index is 18.14 kg/m as calculated from the following:   Height as of this encounter: 5\' 1"  (1.549 m).   Weight as of this encounter: 96 lb (43.5 kg).   BMI interpretation: (18.5-24.9 kg/m2) = Ideal body weight BMI Readings from Last 4 Encounters:  08/20/16 18.14 kg/m  07/03/16 18.40 kg/m  07/03/16 18.89 kg/m  06/02/16 18.89 kg/m   Wt Readings from Last 4 Encounters:  08/20/16 96 lb (43.5 kg)  07/03/16 97 lb 6.4 oz (44.2 kg)  07/03/16 100 lb (45.4 kg)  06/02/16 100 lb (45.4 kg)  Psych/Mental status: Alert and oriented x 3 (person,  place, & time) Eyes: PERLA Respiratory: No evidence of acute respiratory distress  Cervical Spine Exam  Inspection: No masses, redness, or swelling Alignment: Symmetrical Functional ROM: Unrestricted ROM Stability: No instability detected Muscle strength & Tone: Functionally intact Sensory: Unimpaired Palpation: Non-contributory  Upper Extremity (UE) Exam    Side: Right upper extremity  Side: Left upper extremity  Inspection: No masses, redness, swelling, or asymmetry  Inspection: No masses, redness, swelling, or asymmetry  Functional ROM: Unrestricted ROM         Functional ROM: Unrestricted ROM          Muscle strength & Tone: Functionally intact  Muscle strength & Tone: Functionally intact  Sensory: Unimpaired  Sensory: Unimpaired  Palpation: Non-contributory  Palpation: Non-contributory   Thoracic Spine Exam  Inspection: No masses, redness, or  swelling Alignment: Symmetrical Functional ROM: Unrestricted ROM Stability: No instability detected Sensory: Unimpaired Muscle strength & Tone: Functionally intact Palpation: Non-contributory  Lumbar Spine Exam  Inspection: No masses, redness, or swelling Alignment: Symmetrical Functional ROM: Unrestricted ROM Stability: No instability detected Muscle strength & Tone: Functionally intact Sensory: Unimpaired Palpation: Non-contributory Provocative Tests: Lumbar Hyperextension and rotation test: evaluation deferred today       Patrick's Maneuver: evaluation deferred today              Gait & Posture Assessment  Ambulation: Unassisted Gait: Relatively normal for age and body habitus Posture: WNL   Lower Extremity Exam    Side: Right lower extremity  Side: Left lower extremity  Inspection: No masses, redness, swelling, or asymmetry  Inspection: No masses, redness, swelling, or asymmetry  Functional ROM: Unrestricted ROM          Functional ROM: Unrestricted ROM          Muscle strength & Tone: Functionally intact  Muscle strength & Tone: Functionally intact  Sensory: Unimpaired  Sensory: Unimpaired  Palpation: Non-contributory  Palpation: Non-contributory   Assessment  Primary Diagnosis & Pertinent Problem List: The primary encounter diagnosis was Chronic pain. Diagnoses of Long term current use of opiate analgesic, Opiate use (160 MME/Day), Lumbar facet syndrome (Location of Primary Source of Pain) (Bilateral), Chronic low back pain, and Fibromyalgia were also pertinent to this visit.  Visit Diagnosis: 1. Chronic pain   2. Long term current use of opiate analgesic   3. Opiate use (160 MME/Day)   4. Lumbar facet syndrome (Location of Primary Source of Pain) (Bilateral)   5. Chronic low back pain   6. Fibromyalgia    Plan of Care  Pharmacotherapy (Medications Ordered): Meds ordered this encounter  Medications  . fentaNYL (DURAGESIC - DOSED MCG/HR) 50 MCG/HR    Sig: Place  1 patch (50 mcg total) onto the skin every 3 (three) days.    Dispense:  10 patch    Refill:  0    Do not place this medication, or any other prescription from our practice, on "Automatic Refill". Patient may have prescription filled one day early if pharmacy is closed on scheduled refill date. Do not fill until: 10/11/16 To last until: 11/10/16  . DISCONTD: traMADol (ULTRAM) 50 MG tablet    Sig: Take 2 tablets (100 mg total) by mouth every 6 (six) hours as needed for severe pain.    Dispense:  240 tablet    Refill:  1    Do not place this medication, or any other prescription from our practice, on "Automatic Refill". Patient may have prescription filled one day early if pharmacy is closed on scheduled refill  date.  . fentaNYL (DURAGESIC - DOSED MCG/HR) 50 MCG/HR    Sig: Place 1 patch (50 mcg total) onto the skin every 3 (three) days.    Dispense:  10 patch    Refill:  0    Do not place this medication, or any other prescription from our practice, on "Automatic Refill". Patient may have prescription filled one day early if pharmacy is closed on scheduled refill date. Do not fill until: 11/10/16 To last until: 12/10/16  . pregabalin (LYRICA) 150 MG capsule    Sig: Take 1 capsule (150 mg total) by mouth every 8 (eight) hours.    Dispense:  90 capsule    Refill:  1    Do not place this medication, or any other prescription from our practice, on "Automatic Refill". Patient may have prescription filled one day early if pharmacy is closed on scheduled refill date.  . traMADol (ULTRAM) 50 MG tablet    Sig: Take 2 tablets (100 mg total) by mouth every 6 (six) hours as needed for severe pain.    Dispense:  240 tablet    Refill:  4    Do not place this medication, or any other prescription from our practice, on "Automatic Refill". Patient may have prescription filled one day early if pharmacy is closed on scheduled refill date.   New Prescriptions   No medications on file   Medications  administered during this visit: Ms. Laumann had no medications administered during this visit. Lab-work, Procedure(s), & Referral(s) Ordered: No orders of the defined types were placed in this encounter.  Imaging & Referral(s) Ordered: None  Interventional Therapies: Scheduled:None at this time.    Considering: Bilateral lumbar facet radiofrequency neurotomy under fluoroscopic guidance and IV sedation.    PRN Procedures: Bilateral lumbar facet block under fluoroscopic guidance and IV sedation #2.    Requested PM Follow-up: Return in about 3 months (around 12/02/2016) for Med-Mgmt, In addition, (PRN) Procedure.  Future Appointments Date Time Provider Department Center  12/02/2016 1:40 PM Delano Metz, MD [facility] None   Primary Care Physician: Mila Merry, MD Location: [facility] Note by: Sydnee Levans. Laban Emperor, M.D, DABA, DABAPM, DABPM, DABIPP, FIPP  Pain Score Disclaimer: We use the NRS-11 scale. This is a self-reported, subjective measurement of pain severity with only modest accuracy. It is used primarily to identify changes within a particular patient. It must be understood that outpatient pain scales are significantly less accurate that those used for research, where they can be applied under ideal controlled circumstances with minimal exposure to variables. In reality, the score is likely to be a combination of pain intensity and pain affect, where pain affect describes the degree of emotional arousal or changes in action readiness caused by the sensory experience of pain. Factors such as social and work situation, setting, emotional state, anxiety levels, expectation, and prior pain experience may influence pain perception and show large inter-individual differences that may also be affected by time variables.  Patient instructions provided during this appointment: Patient Instructions  You were given 2 prescriptions for Duragesic patch,  one for Lyrica, and one for Tramadol today.

## 2016-08-20 NOTE — Progress Notes (Signed)
Safety precautions to be maintained throughout the outpatient stay will include: orient to surroundings, keep bed in low position, maintain call bell within reach at all times, provide assistance with transfer out of bed and ambulation.  Did not bring medication bottles to this appointment. 

## 2016-08-20 NOTE — Patient Instructions (Signed)
You were given 2 prescriptions for Duragesic patch, one for Lyrica, and one for Tramadol today.

## 2016-09-10 ENCOUNTER — Telehealth: Payer: Self-pay | Admitting: Family Medicine

## 2016-09-10 NOTE — Telephone Encounter (Signed)
Called Pt to schedule AWV with [facility] for 10/27 - knb °

## 2016-09-15 DIAGNOSIS — J449 Chronic obstructive pulmonary disease, unspecified: Secondary | ICD-10-CM | POA: Diagnosis not present

## 2016-09-15 DIAGNOSIS — R110 Nausea: Secondary | ICD-10-CM | POA: Diagnosis not present

## 2016-09-15 DIAGNOSIS — N390 Urinary tract infection, site not specified: Secondary | ICD-10-CM | POA: Diagnosis not present

## 2016-09-15 DIAGNOSIS — G609 Hereditary and idiopathic neuropathy, unspecified: Secondary | ICD-10-CM | POA: Diagnosis not present

## 2016-09-25 ENCOUNTER — Encounter: Payer: Commercial Managed Care - HMO | Admitting: Pain Medicine

## 2016-10-16 DIAGNOSIS — R110 Nausea: Secondary | ICD-10-CM | POA: Diagnosis not present

## 2016-10-16 DIAGNOSIS — G609 Hereditary and idiopathic neuropathy, unspecified: Secondary | ICD-10-CM | POA: Diagnosis not present

## 2016-10-16 DIAGNOSIS — N390 Urinary tract infection, site not specified: Secondary | ICD-10-CM | POA: Diagnosis not present

## 2016-10-16 DIAGNOSIS — J449 Chronic obstructive pulmonary disease, unspecified: Secondary | ICD-10-CM | POA: Diagnosis not present

## 2016-10-24 ENCOUNTER — Ambulatory Visit (INDEPENDENT_AMBULATORY_CARE_PROVIDER_SITE_OTHER): Payer: Medicare HMO | Admitting: Family Medicine

## 2016-10-24 ENCOUNTER — Encounter: Payer: Self-pay | Admitting: Family Medicine

## 2016-10-24 ENCOUNTER — Other Ambulatory Visit: Payer: Self-pay | Admitting: Family Medicine

## 2016-10-24 VITALS — BP 120/60 | HR 72 | Temp 97.5°F | Resp 18 | Wt 89.0 lb

## 2016-10-24 DIAGNOSIS — M79604 Pain in right leg: Secondary | ICD-10-CM

## 2016-10-24 DIAGNOSIS — M810 Age-related osteoporosis without current pathological fracture: Secondary | ICD-10-CM | POA: Diagnosis not present

## 2016-10-24 DIAGNOSIS — L301 Dyshidrosis [pompholyx]: Secondary | ICD-10-CM | POA: Diagnosis not present

## 2016-10-24 DIAGNOSIS — Z720 Tobacco use: Secondary | ICD-10-CM | POA: Diagnosis not present

## 2016-10-24 DIAGNOSIS — L03115 Cellulitis of right lower limb: Secondary | ICD-10-CM | POA: Diagnosis not present

## 2016-10-24 MED ORDER — CELECOXIB 100 MG PO CAPS: 100.0000 mg | ORAL_CAPSULE | Freq: Two times a day (BID) | ORAL | 1 refills | Status: DC | PRN

## 2016-10-24 MED ORDER — ESTROPIPATE 0.75 MG PO TABS: 0.7500 mg | ORAL_TABLET | ORAL | 1 refills | Status: DC

## 2016-10-24 MED ORDER — MUPIROCIN 2 % EX OINT: 1.0000 "application " | TOPICAL_OINTMENT | Freq: Two times a day (BID) | CUTANEOUS | 0 refills | Status: DC

## 2016-10-24 MED ORDER — TRIAMCINOLONE ACETONIDE 0.5 % EX OINT: TOPICAL_OINTMENT | CUTANEOUS | 1 refills | Status: DC

## 2016-10-24 NOTE — Progress Notes (Signed)
Patient: Cheryl [name] Female    DOB: [birth year]   65 y.o.   MRN: [id number] Visit Date: 10/24/2016  Today's Provider: Mila Merry, MD   Chief Complaint  Patient presents with  . Follow-up  . Hyperlipidemia  . Anemia   Subjective:    Patient has a sore on her right lower calf that has been there for several months. Patient states that her leg will turn red intermittently.      She continues to follow up with Dr. Idalia Needle for chronic pain. She requests prescription for occasional Celebrex which has worked well in the past.   Anemia of chronic disease From 01/10/2016-no changes.  Vitamin D deficiency From 01/10/2016-no changes.     Lipid/Cholesterol, Follow-up:   Last seen for this 6 months ago.  Management since that visit includes; no changes.  Last Lipid Panel:    Component Value Date/Time   CHOL 138 01/17/2016 0943   TRIG 108 01/17/2016 0943   HDL 68 01/17/2016 0943   CHOLHDL 2.0 01/17/2016 0943   CHOLHDL 2.3 10/31/2009 0443   VLDL 21 10/31/2009 0443   LDLCALC 48 01/17/2016 0943    She reports good compliance with treatment. She is not having side effects. none  Wt Readings from Last 3 Encounters:  10/24/16 89 lb (40.4 kg)  08/20/16 96 lb (43.5 kg)  07/03/16 97 lb 6.4 oz (44.2 kg)    ------------------------------------------------------------------    Allergies  Allergen Reactions  . Percocet [Oxycodone-Acetaminophen] Hives and Rash  . Aspirin Nausea And Vomiting and Other (See Comments)    Reaction:  GI upset   . Codeine Nausea And Vomiting and Other (See Comments)    Reaction:  GI upset   . Propoxyphene Other (See Comments) and Nausea Only    GI upset Reaction:  GI upset   . Sulfa Antibiotics Rash and Other (See Comments)    Reaction:  GI upset      Current Outpatient Prescriptions:  .  acetaminophen (TYLENOL) 500 MG tablet, Take 1,000 mg by mouth every 6 (six) hours as needed for mild pain or headache. , Disp: , Rfl:  .   ADVAIR DISKUS 250-50 MCG/DOSE AEPB, inhale 1 dose by mouth twice a day, Disp: 60 each, Rfl: 11 .  albuterol (PROVENTIL HFA;VENTOLIN HFA) 108 (90 BASE) MCG/ACT inhaler, Inhale 2 puffs into the lungs every 4 (four) hours as needed for wheezing or shortness of breath., Disp: 18 g, Rfl: 3 .  albuterol (PROVENTIL) (2.5 MG/3ML) 0.083% nebulizer solution, Take 3 mLs (2.5 mg total) by nebulization every 4 (four) hours as needed for wheezing., Disp: 75 mL, Rfl: 12 .  ALPRAZolam (XANAX) 0.5 MG tablet, , Disp: , Rfl: 0 .  clopidogrel (PLAVIX) 75 MG tablet, Take 75 mg by mouth daily., Disp: , Rfl:  .  cyclobenzaprine (FLEXERIL) 5 MG tablet, take 1-2 tablets by mouth every 8 hours if needed, Disp: 30 tablet, Rfl: 3 .  diphenoxylate-atropine (LOMOTIL) 2.5-0.025 MG per tablet, Take 2 tablets by mouth 4 (four) times daily as needed for diarrhea or loose stools. Reported on 03/11/2016, Disp: , Rfl:  .  donepezil (ARICEPT) 10 MG tablet, Take 10 mg by mouth at bedtime., Disp: , Rfl:  .  estropipate (OGEN) 0.75 MG tablet, Take 1 tablet (0.75 mg total) by mouth daily., Disp: 30 tablet, Rfl: 6 .  fentaNYL (DURAGESIC - DOSED MCG/HR) 50 MCG/HR, Place 1 patch (50 mcg total) onto the skin every 3 (three) days., Disp: 10 patch,  Rfl: 0 .  [START ON 11/10/2016] fentaNYL (DURAGESIC - DOSED MCG/HR) 50 MCG/HR, Place 1 patch (50 mcg total) onto the skin every 3 (three) days., Disp: 10 patch, Rfl: 0 .  fluticasone (FLONASE) 50 MCG/ACT nasal spray, Place 2 sprays into both nostrils daily., Disp: 16 g, Rfl: 1 .  furosemide (LASIX) 40 MG tablet, Take 40 mg by mouth daily., Disp: , Rfl:  .  lansoprazole (PREVACID) 30 MG capsule, Take by mouth., Disp: , Rfl:  .  pantoprazole (PROTONIX) 40 MG tablet, Take 40 mg by mouth daily. , Disp: , Rfl:  .  pregabalin (LYRICA) 150 MG capsule, Take 1 capsule (150 mg total) by mouth every 8 (eight) hours., Disp: 90 capsule, Rfl: 1 .  simvastatin (ZOCOR) 10 MG tablet, Take 10 mg by mouth at bedtime.  Reported on 12/04/2015, Disp: , Rfl:  .  SUMAtriptan (IMITREX) 25 MG tablet, Take 25 mg by mouth as needed for migraine. May repeat in 2 hours if headache persists or recurs., Disp: , Rfl:  .  tiotropium (SPIRIVA) 18 MCG inhalation capsule, Place 1 capsule (18 mcg total) into inhaler and inhale daily., Disp: 30 capsule, Rfl: 12 .  traMADol (ULTRAM) 50 MG tablet, Take 2 tablets (100 mg total) by mouth every 6 (six) hours as needed for severe pain., Disp: 240 tablet, Rfl: 4  Review of Systems  Constitutional: Negative for appetite change, chills, fatigue and fever.  Respiratory: Negative for chest tightness and shortness of breath.   Cardiovascular: Negative for chest pain and palpitations.  Gastrointestinal: Negative for abdominal pain, nausea and vomiting.  Skin: Positive for wound.       Right lower calf  Neurological: Negative for dizziness and weakness.    Social History  Substance Use Topics  . Smoking status: Current Every Day Smoker    Packs/day: 0.50    Years: 50.00    Types: Cigarettes  . Smokeless tobacco: Never Used  . Alcohol use No   Objective:   BP 120/60 (BP Location: Right Arm, Patient Position: Sitting, Cuff Size: Normal)   Pulse 72   Temp 97.5 F (36.4 C) (Oral)   Resp 18   Wt 89 lb (40.4 kg)   SpO2 96%   BMI 16.82 kg/m   Physical Exam   General Appearance:    Frail, elderly female NAD Alert, cooperative, no distress  Eyes:    PERRL, conjunctiva/corneas clear, EOM's intact       Lungs:     Clear to auscultation bilaterally, respirations unlabored  Heart:    Regular rate and rhythm  Neurologic:   Awake, alert, oriented x 3. No apparent focal neurological           defect.   Skin:   Mildly inflamed scabbed lesion right lower leg.        Assessment & Plan:     1. Cellulitis of right lower extremity  - mupirocin ointment (BACTROBAN) 2 %; Place 1 application into the nose 2 (two) times daily.  Dispense: 22 g; Refill: 0  2. Tobacco abuse Counseled on  importance of tobacco cessation  3. Osteoporosis, unspecified osteoporosis type, unspecified pathological fracture presence Is overdue of BMD.  - DG Bone Density; Future  4. Dyshidrotic eczema  - triamcinolone ointment (KENALOG) 0.5 %; Apply to lesions on hands twice a day as needed  Dispense: 30 g; Refill: 1  5. Pain of right lower extremity  - celecoxib (CELEBREX) 100 MG capsule; Take 1 capsule (100 mg total) by mouth 2 (  two) times daily as needed.  Dispense: 60 capsule;  Refill: 1  6. Menopausal symptoms She remains on low dose of estrogen which she feels she continues to require to control hot flashes. Counseled regarding cardiac and cancer risks of long term estrogen replacement and to wean to QOD.   Addressed extensive list of chronic and acute medical problems today requiring extensive time in counseling and coordination care.  Over half of this 45 minute visit were spent in counseling and coordinating care of multiple medical problems.        Mila Merryonald Sole Lengacher, MD  [facility]

## 2016-10-24 NOTE — Telephone Encounter (Signed)
Please call in alprazolam.  

## 2016-10-25 ENCOUNTER — Other Ambulatory Visit: Payer: Self-pay | Admitting: Family Medicine

## 2016-10-25 NOTE — Telephone Encounter (Signed)
RX called in-aa 

## 2016-10-29 ENCOUNTER — Telehealth: Payer: Self-pay | Admitting: Family Medicine

## 2016-10-29 DIAGNOSIS — R0602 Shortness of breath: Secondary | ICD-10-CM | POA: Diagnosis not present

## 2016-10-29 DIAGNOSIS — I2510 Atherosclerotic heart disease of native coronary artery without angina pectoris: Secondary | ICD-10-CM | POA: Diagnosis not present

## 2016-10-29 DIAGNOSIS — I739 Peripheral vascular disease, unspecified: Secondary | ICD-10-CM | POA: Diagnosis not present

## 2016-10-29 DIAGNOSIS — I10 Essential (primary) hypertension: Secondary | ICD-10-CM | POA: Diagnosis not present

## 2016-10-29 DIAGNOSIS — K219 Gastro-esophageal reflux disease without esophagitis: Secondary | ICD-10-CM | POA: Diagnosis not present

## 2016-10-29 DIAGNOSIS — F17210 Nicotine dependence, cigarettes, uncomplicated: Secondary | ICD-10-CM | POA: Diagnosis not present

## 2016-10-29 DIAGNOSIS — I340 Nonrheumatic mitral (valve) insufficiency: Secondary | ICD-10-CM | POA: Diagnosis not present

## 2016-10-29 NOTE — Telephone Encounter (Signed)
Pt advised of appointment for bone density at [facility]

## 2016-10-29 NOTE — Telephone Encounter (Signed)
Please advise 

## 2016-10-29 NOTE — Telephone Encounter (Signed)
Pt's husband called to see if pt's bone density scan had been scheduled. I didn't see a referral. Please advise. Thanks TNP

## 2016-11-05 ENCOUNTER — Encounter: Payer: Self-pay | Admitting: Family Medicine

## 2016-11-05 ENCOUNTER — Emergency Department: Payer: Commercial Managed Care - HMO

## 2016-11-05 ENCOUNTER — Inpatient Hospital Stay
Admission: EM | Admit: 2016-11-05 | Discharge: 2016-11-07 | DRG: 193 | Disposition: A | Discharge status: Skilled Nursing Facility | Payer: Commercial Managed Care - HMO | Attending: Internal Medicine | Admitting: Internal Medicine

## 2016-11-05 ENCOUNTER — Ambulatory Visit (INDEPENDENT_AMBULATORY_CARE_PROVIDER_SITE_OTHER): Payer: Medicare HMO | Admitting: Family Medicine

## 2016-11-05 ENCOUNTER — Encounter: Payer: Self-pay | Admitting: Emergency Medicine

## 2016-11-05 VITALS — BP 140/58 | HR 85 | Temp 98.9°F | Resp 14 | Wt 85.0 lb

## 2016-11-05 DIAGNOSIS — R4182 Altered mental status, unspecified: Secondary | ICD-10-CM | POA: Diagnosis not present

## 2016-11-05 DIAGNOSIS — Z7951 Long term (current) use of inhaled steroids: Secondary | ICD-10-CM

## 2016-11-05 DIAGNOSIS — Z79899 Other long term (current) drug therapy: Secondary | ICD-10-CM

## 2016-11-05 DIAGNOSIS — E871 Hypo-osmolality and hyponatremia: Secondary | ICD-10-CM | POA: Diagnosis present

## 2016-11-05 DIAGNOSIS — G629 Polyneuropathy, unspecified: Secondary | ICD-10-CM | POA: Diagnosis present

## 2016-11-05 DIAGNOSIS — J9621 Acute and chronic respiratory failure with hypoxia: Secondary | ICD-10-CM | POA: Diagnosis not present

## 2016-11-05 DIAGNOSIS — Z882 Allergy status to sulfonamides status: Secondary | ICD-10-CM

## 2016-11-05 DIAGNOSIS — R2681 Unsteadiness on feet: Secondary | ICD-10-CM

## 2016-11-05 DIAGNOSIS — Z716 Tobacco abuse counseling: Secondary | ICD-10-CM | POA: Diagnosis not present

## 2016-11-05 DIAGNOSIS — Z9049 Acquired absence of other specified parts of digestive tract: Secondary | ICD-10-CM | POA: Diagnosis not present

## 2016-11-05 DIAGNOSIS — Z681 Body mass index (BMI) 19 or less, adult: Secondary | ICD-10-CM | POA: Diagnosis not present

## 2016-11-05 DIAGNOSIS — R0602 Shortness of breath: Secondary | ICD-10-CM | POA: Diagnosis not present

## 2016-11-05 DIAGNOSIS — Z7902 Long term (current) use of antithrombotics/antiplatelets: Secondary | ICD-10-CM | POA: Diagnosis not present

## 2016-11-05 DIAGNOSIS — R0600 Dyspnea, unspecified: Secondary | ICD-10-CM

## 2016-11-05 DIAGNOSIS — Z833 Family history of diabetes mellitus: Secondary | ICD-10-CM | POA: Diagnosis not present

## 2016-11-05 DIAGNOSIS — G92 Toxic encephalopathy: Secondary | ICD-10-CM | POA: Diagnosis not present

## 2016-11-05 DIAGNOSIS — E785 Hyperlipidemia, unspecified: Secondary | ICD-10-CM | POA: Diagnosis present

## 2016-11-05 DIAGNOSIS — R0902 Hypoxemia: Secondary | ICD-10-CM | POA: Diagnosis not present

## 2016-11-05 DIAGNOSIS — K219 Gastro-esophageal reflux disease without esophagitis: Secondary | ICD-10-CM | POA: Diagnosis present

## 2016-11-05 DIAGNOSIS — T424X5A Adverse effect of benzodiazepines, initial encounter: Secondary | ICD-10-CM | POA: Diagnosis present

## 2016-11-05 DIAGNOSIS — B9789 Other viral agents as the cause of diseases classified elsewhere: Secondary | ICD-10-CM

## 2016-11-05 DIAGNOSIS — M6281 Muscle weakness (generalized): Secondary | ICD-10-CM | POA: Diagnosis not present

## 2016-11-05 DIAGNOSIS — R509 Fever, unspecified: Secondary | ICD-10-CM | POA: Diagnosis not present

## 2016-11-05 DIAGNOSIS — I739 Peripheral vascular disease, unspecified: Secondary | ICD-10-CM | POA: Diagnosis present

## 2016-11-05 DIAGNOSIS — Z90710 Acquired absence of both cervix and uterus: Secondary | ICD-10-CM | POA: Diagnosis not present

## 2016-11-05 DIAGNOSIS — T404X5A Adverse effect of other synthetic narcotics, initial encounter: Secondary | ICD-10-CM | POA: Diagnosis present

## 2016-11-05 DIAGNOSIS — Z8249 Family history of ischemic heart disease and other diseases of the circulatory system: Secondary | ICD-10-CM | POA: Diagnosis not present

## 2016-11-05 DIAGNOSIS — E7800 Pure hypercholesterolemia, unspecified: Secondary | ICD-10-CM | POA: Diagnosis present

## 2016-11-05 DIAGNOSIS — Z888 Allergy status to other drugs, medicaments and biological substances status: Secondary | ICD-10-CM

## 2016-11-05 DIAGNOSIS — F17210 Nicotine dependence, cigarettes, uncomplicated: Secondary | ICD-10-CM | POA: Diagnosis not present

## 2016-11-05 DIAGNOSIS — Z86718 Personal history of other venous thrombosis and embolism: Secondary | ICD-10-CM | POA: Diagnosis not present

## 2016-11-05 DIAGNOSIS — J181 Lobar pneumonia, unspecified organism: Secondary | ICD-10-CM | POA: Diagnosis not present

## 2016-11-05 DIAGNOSIS — Z736 Limitation of activities due to disability: Secondary | ICD-10-CM | POA: Diagnosis not present

## 2016-11-05 DIAGNOSIS — Z885 Allergy status to narcotic agent status: Secondary | ICD-10-CM

## 2016-11-05 DIAGNOSIS — J440 Chronic obstructive pulmonary disease with acute lower respiratory infection: Secondary | ICD-10-CM | POA: Diagnosis present

## 2016-11-05 DIAGNOSIS — J189 Pneumonia, unspecified organism: Secondary | ICD-10-CM | POA: Diagnosis not present

## 2016-11-05 DIAGNOSIS — J9601 Acute respiratory failure with hypoxia: Secondary | ICD-10-CM | POA: Diagnosis present

## 2016-11-05 DIAGNOSIS — M797 Fibromyalgia: Secondary | ICD-10-CM | POA: Diagnosis present

## 2016-11-05 DIAGNOSIS — I10 Essential (primary) hypertension: Secondary | ICD-10-CM | POA: Diagnosis not present

## 2016-11-05 DIAGNOSIS — Z8261 Family history of arthritis: Secondary | ICD-10-CM | POA: Diagnosis not present

## 2016-11-05 DIAGNOSIS — E43 Unspecified severe protein-calorie malnutrition: Secondary | ICD-10-CM | POA: Diagnosis not present

## 2016-11-05 DIAGNOSIS — J069 Acute upper respiratory infection, unspecified: Secondary | ICD-10-CM

## 2016-11-05 DIAGNOSIS — G9340 Encephalopathy, unspecified: Secondary | ICD-10-CM | POA: Diagnosis not present

## 2016-11-05 DIAGNOSIS — G8929 Other chronic pain: Secondary | ICD-10-CM | POA: Diagnosis not present

## 2016-11-05 DIAGNOSIS — Z79891 Long term (current) use of opiate analgesic: Secondary | ICD-10-CM

## 2016-11-05 LAB — URINALYSIS, COMPLETE (UACMP) WITH MICROSCOPIC
BACTERIA UA: NONE SEEN
Bilirubin Urine: NEGATIVE
Glucose, UA: NEGATIVE mg/dL
KETONES UR: 20 mg/dL — AB
LEUKOCYTES UA: NEGATIVE
Nitrite: NEGATIVE
PH: 5 (ref 5.0–8.0)
PROTEIN: NEGATIVE mg/dL
Specific Gravity, Urine: 1.008 (ref 1.005–1.030)

## 2016-11-05 LAB — BLOOD GAS, ARTERIAL
ACID-BASE EXCESS: 4.4 mmol/L — AB (ref 0.0–2.0)
Bicarbonate: 29.8 mmol/L — ABNORMAL HIGH (ref 20.0–28.0)
FIO2: 0.28
O2 SAT: 91.6 %
PATIENT TEMPERATURE: 37
PCO2 ART: 47 mmHg (ref 32.0–48.0)
pH, Arterial: 7.41 (ref 7.350–7.450)
pO2, Arterial: 62 mmHg — ABNORMAL LOW (ref 83.0–108.0)

## 2016-11-05 LAB — COMPREHENSIVE METABOLIC PANEL
ALBUMIN: 3.7 g/dL (ref 3.5–5.0)
ALK PHOS: 124 U/L (ref 38–126)
ALT: 10 U/L — ABNORMAL LOW (ref 14–54)
ANION GAP: 13 (ref 5–15)
AST: 21 U/L (ref 15–41)
BUN: 23 mg/dL — ABNORMAL HIGH (ref 6–20)
CO2: 28 mmol/L (ref 22–32)
Calcium: 8.2 mg/dL — ABNORMAL LOW (ref 8.9–10.3)
Chloride: 92 mmol/L — ABNORMAL LOW (ref 101–111)
Creatinine, Ser: 1.04 mg/dL — ABNORMAL HIGH (ref 0.44–1.00)
GFR calc Af Amer: >60 mL/min (ref >60)
GFR calc non Af Amer: 54 mL/min — ABNORMAL LOW (ref >60)
GLUCOSE: 70 mg/dL (ref 65–99)
POTASSIUM: 3.4 mmol/L — AB (ref 3.5–5.1)
SODIUM: 133 mmol/L — AB (ref 135–145)
Total Bilirubin: 1.2 mg/dL (ref 0.3–1.2)
Total Protein: 7.9 g/dL (ref 6.5–8.1)

## 2016-11-05 LAB — CBC WITH DIFFERENTIAL/PLATELET
Basophils Absolute: 0.1 10*3/uL (ref 0–0.1)
Basophils Relative: 1 %
Eosinophils Absolute: 0 10*3/uL (ref 0–0.7)
Eosinophils Relative: 0 %
HEMATOCRIT: 32.3 % — AB (ref 35.0–47.0)
HEMOGLOBIN: 10.9 g/dL — AB (ref 12.0–16.0)
LYMPHS ABS: 0.5 10*3/uL — AB (ref 1.0–3.6)
LYMPHS PCT: 5 %
MCH: 28.8 pg (ref 26.0–34.0)
MCHC: 33.8 g/dL (ref 32.0–36.0)
MCV: 85.3 fL (ref 80.0–100.0)
MONO ABS: 0.8 10*3/uL (ref 0.2–0.9)
MONOS PCT: 7 %
NEUTROS ABS: 10.1 10*3/uL — AB (ref 1.4–6.5)
NEUTROS PCT: 87 %
Platelets: 226 10*3/uL (ref 150–440)
RBC: 3.78 MIL/uL — ABNORMAL LOW (ref 3.80–5.20)
RDW: 16.9 % — ABNORMAL HIGH (ref 11.5–14.5)
WBC: 11.5 10*3/uL — ABNORMAL HIGH (ref 3.6–11.0)

## 2016-11-05 LAB — BRAIN NATRIURETIC PEPTIDE: B Natriuretic Peptide: 55 pg/mL (ref 0.0–100.0)

## 2016-11-05 LAB — TROPONIN I

## 2016-11-05 LAB — PROTIME-INR
INR: 0.97
Prothrombin Time: 12.9 seconds (ref 11.4–15.2)

## 2016-11-05 LAB — LACTIC ACID, PLASMA: Lactic Acid, Venous: 0.9 mmol/L (ref 0.5–1.9)

## 2016-11-05 MED ORDER — POTASSIUM CHLORIDE CRYS ER 10 MEQ PO TBCR
10.0000 meq | EXTENDED_RELEASE_TABLET | Freq: Every day | ORAL | Status: DC
Start: 2016-11-05 — End: 2016-11-07
  Administered 2016-11-05 – 2016-11-06 (×2): 10 meq via ORAL
  Filled 2016-11-05 (×2): qty 1

## 2016-11-05 MED ORDER — PANTOPRAZOLE SODIUM 40 MG PO TBEC
40.0000 mg | DELAYED_RELEASE_TABLET | Freq: Two times a day (BID) | ORAL | Status: DC
  Administered 2016-11-06 – 2016-11-07 (×3): 40 mg via ORAL
  Filled 2016-11-05 (×4): qty 1

## 2016-11-05 MED ORDER — TIOTROPIUM BROMIDE MONOHYDRATE 18 MCG IN CAPS
18.0000 ug | ORAL_CAPSULE | Freq: Every day | RESPIRATORY_TRACT | Status: DC
  Administered 2016-11-05 – 2016-11-07 (×3): 18 ug via RESPIRATORY_TRACT
  Filled 2016-11-05: qty 5

## 2016-11-05 MED ORDER — CYCLOBENZAPRINE HCL 10 MG PO TABS
5.0000 mg | ORAL_TABLET | Freq: Three times a day (TID) | ORAL | Status: DC | PRN
  Administered 2016-11-05 – 2016-11-07 (×4): 10 mg via ORAL
  Filled 2016-11-05: qty 2
  Filled 2016-11-05 (×2): qty 1
  Filled 2016-11-05: qty 2

## 2016-11-05 MED ORDER — LORAZEPAM 2 MG/ML IJ SOLN
INTRAMUSCULAR | Status: AC
  Administered 2016-11-06: 0.5 mg via INTRAVENOUS
  Filled 2016-11-05: qty 1

## 2016-11-05 MED ORDER — SENNOSIDES-DOCUSATE SODIUM 8.6-50 MG PO TABS: 1.0000 | ORAL_TABLET | Freq: Every evening | ORAL | Status: DC | PRN

## 2016-11-05 MED ORDER — ALBUTEROL SULFATE (2.5 MG/3ML) 0.083% IN NEBU: 2.5000 mg | INHALATION_SOLUTION | RESPIRATORY_TRACT | Status: DC | PRN

## 2016-11-05 MED ORDER — SIMVASTATIN 10 MG PO TABS
10.0000 mg | ORAL_TABLET | Freq: Every day | ORAL | Status: DC
  Administered 2016-11-06: 10 mg via ORAL
  Filled 2016-11-05 (×2): qty 1

## 2016-11-05 MED ORDER — LORAZEPAM 2 MG/ML IJ SOLN
0.5000 mg | Freq: Once | INTRAMUSCULAR | Status: AC
  Administered 2016-11-05 – 2016-11-06 (×2): 0.5 mg via INTRAVENOUS

## 2016-11-05 MED ORDER — IPRATROPIUM-ALBUTEROL 0.5-2.5 (3) MG/3ML IN SOLN
3.0000 mL | Freq: Four times a day (QID) | RESPIRATORY_TRACT | Status: DC
  Administered 2016-11-05 – 2016-11-07 (×5): 3 mL via RESPIRATORY_TRACT
  Filled 2016-11-05 (×7): qty 3

## 2016-11-05 MED ORDER — ONDANSETRON HCL 4 MG PO TABS: 4.0000 mg | ORAL_TABLET | Freq: Four times a day (QID) | ORAL | Status: DC | PRN

## 2016-11-05 MED ORDER — AZITHROMYCIN 500 MG IV SOLR
500.0000 mg | INTRAVENOUS | Status: DC
  Administered 2016-11-06: 500 mg via INTRAVENOUS
  Filled 2016-11-05 (×2): qty 500

## 2016-11-05 MED ORDER — CEFTRIAXONE SODIUM-DEXTROSE 1-3.74 GM-% IV SOLR
1.0000 g | INTRAVENOUS | Status: DC
  Administered 2016-11-06: 1 g via INTRAVENOUS
  Filled 2016-11-05 (×2): qty 50

## 2016-11-05 MED ORDER — DEXTROSE 5 % IV SOLN
500.0000 mg | Freq: Once | INTRAVENOUS | Status: AC
  Administered 2016-11-05: 500 mg via INTRAVENOUS
  Filled 2016-11-05: qty 500

## 2016-11-05 MED ORDER — ESTROPIPATE 0.75 MG PO TABS
0.7500 mg | ORAL_TABLET | ORAL | Status: DC
  Administered 2016-11-07: 0.75 mg via ORAL
  Filled 2016-11-05: qty 1

## 2016-11-05 MED ORDER — DEXTROSE 5 % IV SOLN: 1.0000 g | Freq: Once | INTRAVENOUS | Status: DC

## 2016-11-05 MED ORDER — TRAMADOL HCL 50 MG PO TABS
100.0000 mg | ORAL_TABLET | Freq: Four times a day (QID) | ORAL | Status: DC | PRN
  Administered 2016-11-05 (×2): 100 mg via ORAL
  Filled 2016-11-05 (×2): qty 2

## 2016-11-05 MED ORDER — CEFTRIAXONE SODIUM-DEXTROSE 1-3.74 GM-% IV SOLR
1.0000 g | Freq: Once | INTRAVENOUS | Status: AC
  Administered 2016-11-05: 1 g via INTRAVENOUS
  Filled 2016-11-05: qty 50

## 2016-11-05 MED ORDER — METHYLPREDNISOLONE SODIUM SUCC 125 MG IJ SOLR
60.0000 mg | Freq: Two times a day (BID) | INTRAMUSCULAR | Status: DC
  Administered 2016-11-05 – 2016-11-06 (×3): 60 mg via INTRAVENOUS
  Filled 2016-11-05 (×4): qty 2

## 2016-11-05 MED ORDER — DIPHENOXYLATE-ATROPINE 2.5-0.025 MG PO TABS: 2.0000 | ORAL_TABLET | Freq: Four times a day (QID) | ORAL | Status: DC | PRN

## 2016-11-05 MED ORDER — FUROSEMIDE 40 MG PO TABS
40.0000 mg | ORAL_TABLET | Freq: Every day | ORAL | Status: DC
  Administered 2016-11-06 – 2016-11-07 (×2): 40 mg via ORAL
  Filled 2016-11-05 (×2): qty 1

## 2016-11-05 MED ORDER — FLUTICASONE PROPIONATE 50 MCG/ACT NA SUSP
2.0000 | Freq: Every day | NASAL | Status: DC
  Administered 2016-11-06: 2 via NASAL
  Filled 2016-11-05: qty 16

## 2016-11-05 MED ORDER — MOMETASONE FURO-FORMOTEROL FUM 200-5 MCG/ACT IN AERO
2.0000 | INHALATION_SPRAY | Freq: Two times a day (BID) | RESPIRATORY_TRACT | Status: DC
  Administered 2016-11-06 – 2016-11-07 (×3): 2 via RESPIRATORY_TRACT
  Filled 2016-11-05: qty 8.8

## 2016-11-05 MED ORDER — PREGABALIN 50 MG PO CAPS
150.0000 mg | ORAL_CAPSULE | Freq: Three times a day (TID) | ORAL | Status: DC
  Administered 2016-11-05 – 2016-11-07 (×6): 150 mg via ORAL
  Filled 2016-11-05 (×7): qty 3

## 2016-11-05 MED ORDER — DONEPEZIL HCL 5 MG PO TABS
10.0000 mg | ORAL_TABLET | Freq: Every day | ORAL | Status: DC
  Administered 2016-11-06: 10 mg via ORAL
  Filled 2016-11-05 (×2): qty 2

## 2016-11-05 MED ORDER — FENTANYL 12 MCG/HR TD PT72: 50.0000 ug | MEDICATED_PATCH | TRANSDERMAL | Status: DC

## 2016-11-05 MED ORDER — SODIUM CHLORIDE 0.9 % IV SOLN
INTRAVENOUS | Status: DC
  Administered 2016-11-05 – 2016-11-07 (×4): via INTRAVENOUS

## 2016-11-05 MED ORDER — PANTOPRAZOLE SODIUM 40 MG PO TBEC: 40.0000 mg | DELAYED_RELEASE_TABLET | Freq: Every day | ORAL | Status: DC

## 2016-11-05 MED ORDER — ONDANSETRON HCL 4 MG/2ML IJ SOLN: 4.0000 mg | Freq: Four times a day (QID) | INTRAMUSCULAR | Status: DC | PRN

## 2016-11-05 MED ORDER — ENOXAPARIN SODIUM 40 MG/0.4ML ~~LOC~~ SOLN
40.0000 mg | SUBCUTANEOUS | Status: DC
  Administered 2016-11-05: 40 mg via SUBCUTANEOUS
  Filled 2016-11-05: qty 0.4

## 2016-11-05 MED ORDER — CLOPIDOGREL BISULFATE 75 MG PO TABS
75.0000 mg | ORAL_TABLET | Freq: Every day | ORAL | Status: DC
  Administered 2016-11-06 – 2016-11-07 (×2): 75 mg via ORAL
  Filled 2016-11-05 (×2): qty 1

## 2016-11-05 MED ORDER — ACETAMINOPHEN 500 MG PO TABS: 1000.0000 mg | ORAL_TABLET | Freq: Four times a day (QID) | ORAL | Status: DC | PRN
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
## 2016-11-05 NOTE — ED Notes (Signed)
Dr Tildon Huskymodi returned call. Informed of sats. Ok with sats 89-*-90. Informed her fentanyl patch removed from pts back.

## 2016-11-05 NOTE — Consult Note (Signed)
Pharmacy Antibiotic Note  Cheryl [name] is a 65 y.o. female admitted on 11/05/2016 with pneumonia.  Pharmacy has been consulted for ceftriaxone dosing.  Plan: ceftriaxone 1g q 24hr  Weight: 85 lb (38.6 kg)  Temp (24hrs), Avg:98.7 F (37.1 C), Min:97.7 F (36.5 C), Max:99.5 F (37.5 C)   Recent Labs Lab 11/05/16 1029  WBC 11.5*  CREATININE 1.04*  LATICACIDVEN 0.9    Estimated Creatinine Clearance: 32 mL/min (by C-G formula based on SCr of 1.04 mg/dL (H)).    Allergies  Allergen Reactions  . Percocet [Oxycodone-Acetaminophen] Hives and Rash  . Aspirin Nausea And Vomiting and Other (See Comments)    Reaction:  GI upset   . Codeine Nausea And Vomiting and Other (See Comments)    Reaction:  GI upset   . Propoxyphene Other (See Comments) and Nausea Only    GI upset Reaction:  GI upset   . Sulfa Antibiotics Rash and Other (See Comments)    Reaction:  GI upset     Antimicrobials this admission: ceftriaxone 12/12 >>  azithromycin 12/12>>   Dose adjustments this admission:   Microbiology results: 12/12 BCx:  12/12 UCx:   Chest x-ray potential PNA in left lower lobe  Thank you for allowing pharmacy to be a part of this patient's care.  Olene FlossMelissa D Maccia, Pharm.D, BCPS Clinical Pharmacist  11/05/2016 2:19 PM

## 2016-11-05 NOTE — Progress Notes (Signed)
Pt with orders for fentanyl patch but has increasing lethargy and SOB. Per MD will hold medication for now, old fentanyl patch removed.

## 2016-11-05 NOTE — Progress Notes (Signed)
Subjective:     Patient ID: Cheryl [name], female   DOB: 10/18/1951, 65 y.o.   MRN: [id number]  HPI  Chief Complaint  Patient presents with  . Fatigue    Patient comes in office today accompanied by her husband who has concerns of fatigue since 11/03/16. Patients spouse reports cough, congestion, sore throat and wheezing.    States she was around her grand children this weekend. Reports that she feels more short of breath than usual along with nasal congestion. Has not used her nebulizer and uses oxygen as needed. Husband reports compliance with maintenance inhalers. "She didn't want to go to the emergency room". History of hospitalization for pneumonia in July of this year.She is in a wheelchair today.  Review of Systems     Objective:   Physical Exam  Constitutional: She appears well-developed and well-nourished. She appears distressed (slumped over in wheelchair but arouseable and will respond verbally to questions.).  Pulmonary/Chest:  Bibasilar crackles       Assessment:    1. Viral upper respiratory tract infection  2. Hypoxemia: will refer to ER for evaluation     Plan:    Further f/u pending ER evaluation.

## 2016-11-05 NOTE — ED Notes (Signed)
Pt in CT.

## 2016-11-05 NOTE — ED Notes (Signed)
Dr Tildon Huskymodi paged to inform about pt sats.

## 2016-11-05 NOTE — ED Triage Notes (Addendum)
Patient presents to the ED from the doctor's office with lethargy low oxygen saturation and crackles.  Patient having difficulty staying awake during triage.  Skin is very pale.

## 2016-11-05 NOTE — Patient Instructions (Signed)
Report to the ER for further evaluation

## 2016-11-05 NOTE — ED Provider Notes (Signed)
[facility] Emergency Department Provider Note   ____________________________________________   First MD Initiated Contact with Patient 11/05/16 1045     (approximate)  I have reviewed the triage vital signs and the nursing notes.   HISTORY  Chief Complaint Shortness of Breath History limited by patient's sleepiness   HPI Cheryl [name] is a 65 y.o. female patient sent from the doctor's office because she has been getting increasingly short of breath and fever last day or so really has been sleeping a lot not waking up. In the office reportedly she was having a lot of difficulty staying awake and had crackles on exam. In the emergency room she will not stay awake. History is supplied by her husband. He reports she's had a cough for the last 2-3 days. She is not running a fever. She's been sleeping a lot for the last at least 2 days. She has a history of COPD. As as CHF.   Past Medical History:  Diagnosis Date  . Allergy   . Anxiety   . Arthritis   . Aspiration pneumonitis (HCC) 11/24/2015  . Asthma   . CHF (congestive heart failure) (HCC)   . Chronic pain   . COPD (chronic obstructive pulmonary disease) (HCC)   . Coronary artery disease   . DVT (deep venous thrombosis) (HCC)   . GERD (gastroesophageal reflux disease)   . Headache   . Hyperlipidemia   . Hypertension   . Migraines   . Neuropathy (HCC) 2010  . Osteoporosis   . Oxygen deficiency   . Peripheral vascular disease (HCC)   . Pneumonia   . Pneumonia 11/19/2015  . Vitamin D deficiency     Patient Active Problem List   Diagnosis Date Noted  . CAP (community acquired pneumonia) 11/05/2016  . Nausea with vomiting   . Pneumonia 06/03/2016  . Lumbar facet syndrome (Location of Primary Source of Pain) (Bilateral) 03/20/2016  . Lumbar spondylosis 03/20/2016  . Opiate use (160 MME/Day) 02/28/2016  . Anemia 02/28/2016  . Osteoarthrosis 12/18/2015  . Long term current use of  anticoagulant therapy (Plavix) 12/18/2015  . Long term current use of opiate analgesic 12/04/2015  . Fibromyalgia 12/04/2015  . Dysphagia 11/24/2015  . Acute diastolic CHF (congestive heart failure) (HCC) 11/24/2015  . Abnormal mammogram of right breast 10/11/2015  . Dilated intrahepatic bile duct 10/11/2015  . Lumbar radicular pain 09/04/2015  . Chronic low back pain 09/04/2015  . Encounter for therapeutic drug level monitoring 09/04/2015  . Uncomplicated opioid dependence (HCC) 09/04/2015  . Chronic pain syndrome 09/04/2015  . Platelet inhibition due to Plavix 09/04/2015  . COPD (chronic obstructive pulmonary disease) (HCC) 07/31/2015  . Dementia 07/31/2015  . Airway hyperreactivity 07/03/2015  . Back pain, thoracic 07/03/2015  . Excessive falling 07/03/2015  . Alteration in bowel elimination: incontinence 07/03/2015  . Insomnia 07/03/2015  . Decreased testosterone level 07/03/2015  . Leg weakness 07/03/2015  . Menopausal symptom 07/03/2015  . Migraine 07/03/2015  . Neuropathy (HCC) 07/03/2015  . Fecal occult blood test positive 07/03/2015  . OP (osteoporosis) 07/03/2015  . Panic disorder 07/03/2015  . Compulsive tobacco user syndrome 07/03/2015  . Urinary incontinence 07/03/2015  . Weight loss 07/03/2015  . Essential hypertension 06/06/2015  . GERD (gastroesophageal reflux disease) 06/06/2015  . Hyperlipemia 06/06/2015  . 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 Anxiety 02/10/2014  . Coronary artery disease 02/10/2014  . Hypercholesteremia 02/10/2014  . Peripheral vascular disease (HCC) 02/10/2014  . Vitamin D deficiency 10/16/2009  Past Surgical History:  Procedure Laterality Date  . ABDOMINAL HYSTERECTOMY  1975   Bilaterl Oophorectomy; Dur to IUD infection  . abdomnal aortic stent  05/30/2008   Dr. Nanetta BattyJonathan Berry  . APPENDECTOMY    . APPENDECTOMY    . cardiac catherization  10/31/2009  . CERVICAL FUSION  C5 - 6/C6-7  . CHOLECYSTECTOMY  1972  .  COLONOSCOPY WITH PROPOFOL N/A 07/27/2015   Procedure: COLONOSCOPY WITH PROPOFOL;  Surgeon: Wallace CullensPaul Y Oh, MD;  [facility];  Service: Gastroenterology;  Laterality: N/A;  . ESOPHAGOGASTRODUODENOSCOPY (EGD) WITH PROPOFOL N/A 07/27/2015   Procedure: ESOPHAGOGASTRODUODENOSCOPY (EGD) WITH PROPOFOL;  Surgeon: Wallace CullensPaul Y Oh, MD;  [facility];  Service: Gastroenterology;  Laterality: N/A;  . FOOT SURGERY Bilateral    5-6 years per patient  . SPINE SURGERY      Prior to Admission medications   Medication Sig Start Date End Date Taking? Authorizing Provider  acetaminophen (TYLENOL) 500 MG tablet Take 1,000 mg by mouth every 6 (six) hours as needed for mild pain or headache.     Historical Provider, MD  ADVAIR DISKUS 250-50 MCG/DOSE AEPB inhale 1 dose by mouth twice a day 08/15/16   Malva Limesonald E Fisher, MD  albuterol (PROVENTIL HFA;VENTOLIN HFA) 108 (90 BASE) MCG/ACT inhaler Inhale 2 puffs into the lungs every 4 (four) hours as needed for wheezing or shortness of breath. 08/11/15   Malva Limesonald E Fisher, MD  albuterol (PROVENTIL) (2.5 MG/3ML) 0.083% nebulizer solution Take 3 mLs (2.5 mg total) by nebulization every 4 (four) hours as needed for wheezing. 10/12/15   Katharina Caperima Vaickute, MD  ALPRAZolam Prudy Feeler(XANAX) 0.5 MG tablet take 1 tablet by mouth every 6 hours if needed 10/24/16   Malva Limesonald E Fisher, MD  celecoxib (CELEBREX) 100 MG capsule Take 1 capsule (100 mg total) by mouth 2 (two) times daily as needed. 10/24/16   Malva Limesonald E Fisher, MD  clopidogrel (PLAVIX) 75 MG tablet Take 75 mg by mouth daily.    Historical Provider, MD  cyclobenzaprine (FLEXERIL) 5 MG tablet take 1-2 tablets by mouth every 8 hours if needed 04/23/16   Malva Limesonald E Fisher, MD  diphenoxylate-atropine (LOMOTIL) 2.5-0.025 MG per tablet Take 2 tablets by mouth 4 (four) times daily as needed for diarrhea or loose stools. Reported on 03/11/2016    Historical Provider, MD  donepezil (ARICEPT) 10 MG tablet Take 10 mg by mouth at bedtime.    Historical  Provider, MD  estropipate (OGEN) 0.75 MG tablet Take 1 tablet (0.75 mg total) by mouth every other day. 10/24/16   Malva Limesonald E Fisher, MD  fentaNYL (DURAGESIC - DOSED MCG/HR) 50 MCG/HR Place 1 patch (50 mcg total) onto the skin every 3 (three) days. 10/11/16 11/10/16  Delano MetzFrancisco Naveira, MD  fentaNYL (DURAGESIC - DOSED MCG/HR) 50 MCG/HR Place 1 patch (50 mcg total) onto the skin every 3 (three) days. 11/10/16 12/10/16  Delano MetzFrancisco Naveira, MD  fluticasone (FLONASE) 50 MCG/ACT nasal spray Place 2 sprays into both nostrils daily. 04/09/16   Malva Limesonald E Fisher, MD  furosemide (LASIX) 40 MG tablet Take 40 mg by mouth daily.    Historical Provider, MD  lansoprazole (PREVACID) 30 MG capsule Take by mouth. 09/14/15   Historical Provider, MD  mupirocin ointment (BACTROBAN) 2 % Place 1 application into the nose 2 (two) times daily. 10/24/16   Malva Limesonald E Fisher, MD  pantoprazole (PROTONIX) 40 MG tablet take 1 tablet by mouth twice a day 10/25/16   Malva Limesonald E Fisher, MD  potassium chloride (K-DUR,KLOR-CON) 10 MEQ tablet  Take 10 mEq by mouth daily. 10/29/16   Historical Provider, MD  pregabalin (LYRICA) 150 MG capsule Take 1 capsule (150 mg total) by mouth every 8 (eight) hours. 10/11/16 12/10/16  Delano Metz, MD  simvastatin (ZOCOR) 10 MG tablet Take 10 mg by mouth at bedtime. Reported on 12/04/2015    Historical Provider, MD  SUMAtriptan (IMITREX) 25 MG tablet Take 25 mg by mouth as needed for migraine. May repeat in 2 hours if headache persists or recurs.    Historical Provider, MD  tiotropium (SPIRIVA) 18 MCG inhalation capsule Place 1 capsule (18 mcg total) into inhaler and inhale daily. 10/25/15   Malva Limes, MD  traMADol (ULTRAM) 50 MG tablet Take 2 tablets (100 mg total) by mouth every 6 (six) hours as needed for severe pain. 08/20/16 12/10/16  Delano Metz, MD  triamcinolone ointment (KENALOG) 0.5 % Apply to lesions on hands twice a day as needed 10/24/16   Malva Limes, MD    Allergies Percocet  [oxycodone-acetaminophen]; Aspirin; Codeine; Propoxyphene; and Sulfa antibiotics  Family History  Problem Relation Age of Onset  . Cancer Mother   . Arthritis Mother   . Heart disease Mother   . Diabetes Mother     mellitus, type 2  . Heart disease Father   . Diabetes Sister   . Cancer Brother   . Cancer Brother     lung  . Diabetes Brother     Social History Social History  Substance Use Topics  . Smoking status: Current Every Day Smoker    Packs/day: 0.50    Years: 50.00    Types: Cigarettes  . Smokeless tobacco: Never Used     Comment: Previously smoked 2 ppd  . Alcohol use No    Review of Systems Unable to obtain  ____________________________________________   PHYSICAL EXAM:  VITAL SIGNS: ED Triage Vitals  Enc Vitals Group     BP 11/05/16 1015 (!) 117/46     Pulse Rate 11/05/16 1015 83     Resp 11/05/16 1015 20     Temp 11/05/16 1015 99.5 F (37.5 C)     Temp Source 11/05/16 1015 Oral     SpO2 11/05/16 1015 (!) 89 %     Weight 11/05/16 1016 85 lb (38.6 kg)     Height --      Head Circumference --      Peak Flow --      Pain Score --      Pain Loc --      Pain Edu? --      Excl. in GC? --    Constitutional:A shouldn't sleepy and difficult to arouse will mumble forming not much else. Eyes: Conjunctivae are normal. PERRL. EOMI. Head: Atraumatic. Nose: No congestion/rhinnorhea. Mouth/Throat: Mucous membranes are moist.  Oropharynx non-erythematous. Neck: No stridor. Cardiovascular: Normal rate, regular rhythm. Grossly normal heart sounds.  Good peripheral circulation. Respiratory: Normal respiratory effort.  No retractions. Lungs suggestion of crackles present throughout the lungs Gastrointestinal: Soft and nontender. No distention. No abdominal bruits. No CVA tenderness. Musculoskeletal: No lower extremity tenderness nor edema.  No joint effusions.   ____________________________________________   LABS (all labs ordered are listed, but only  abnormal results are displayed)  Labs Reviewed  COMPREHENSIVE METABOLIC PANEL - Abnormal; Notable for the following:       Result Value   Sodium 133 (*)    Potassium 3.4 (*)    Chloride 92 (*)    BUN 23 (*)  Creatinine, Ser 1.04 (*)    Calcium 8.2 (*)    ALT 10 (*)    GFR calc non Af Amer 54 (*)    All other components within normal limits  CBC WITH DIFFERENTIAL/PLATELET - Abnormal; Notable for the following:    WBC 11.5 (*)    RBC 3.78 (*)    Hemoglobin 10.9 (*)    HCT 32.3 (*)    RDW 16.9 (*)    Neutro Abs 10.1 (*)    Lymphs Abs 0.5 (*)    All other components within normal limits  URINALYSIS, COMPLETE (UACMP) WITH MICROSCOPIC - Abnormal; Notable for the following:    Color, Urine STRAW (*)    APPearance CLEAR (*)    Hgb urine dipstick MODERATE (*)    Ketones, ur 20 (*)    Squamous Epithelial / LPF 0-5 (*)    All other components within normal limits  BLOOD GAS, ARTERIAL - Abnormal; Notable for the following:    pO2, Arterial 62 (*)    Bicarbonate 29.8 (*)    Acid-Base Excess 4.4 (*)    All other components within normal limits  CULTURE, BLOOD (ROUTINE X 2)  CULTURE, BLOOD (ROUTINE X 2)  URINE CULTURE  LACTIC ACID, PLASMA  PROTIME-INR  BRAIN NATRIURETIC PEPTIDE  TROPONIN I  LACTIC ACID, PLASMA   ____________________________________________  EKG  EKG read and interpreted by me shows normal sinus rhythm rate of 76 normal axis there is ST segment downsloping and slight depression inferiorly ____________________________________________  RADIOLOGY Study Result   CLINICAL DATA:  Shortness of breath, lethargy, hypoxia and crackles.  EXAM: PORTABLE CHEST 1 VIEW  COMPARISON:  06/02/2016  FINDINGS: The heart size and mediastinal contours are within normal limits. Irregular airspace opacity in the left lower lung may represent acute pneumonia. No edema or pleural fluid identified. The visualized skeletal structures are  unremarkable.  IMPRESSION: Potential pneumonia in the left lower lung.   Electronically Signed   By: Irish LackGlenn  Yamagata M.D.   On: 11/05/2016 11:22    Study Result   CLINICAL DATA:  Lethargy/ altered mental status.  Fall 1 week prior  EXAM: CT HEAD WITHOUT CONTRAST  TECHNIQUE: Contiguous axial images were obtained from the base of the skull through the vertex without intravenous contrast.  COMPARISON:  October 09, 2015  FINDINGS: Brain: The ventricles are normal in size and configuration. There is no intracranial mass, hemorrhage, extra-axial fluid collection, or midline shift. There is patchy small vessel disease in the centra semiovale bilaterally. There is no new gray-white compartment lesion. No acute infarct evident.  Vascular: There is no hyperdense vessel. There are foci of calcification in each carotid siphon region.  Skull: The bony calvarium appears intact. There is a high right parietal scalp hematoma.  Sinuses/Orbits: There is extensive opacification of multiple ethmoid air cells bilaterally. There is mucosal thickening in the sphenoid sinus regions bilaterally. Mucosal thickening is also noted in each maxillary antrum. Frontal sinuses are nearly aplastic. There is opacification in portions of the left maxillary antrum which are opacified. Orbits appear symmetric bilaterally.  Other: Mastoid air cells are clear.  IMPRESSION: Patchy periventricular small vessel disease. No acute infarct evident. No intracranial mass, hemorrhage, or extra-axial fluid collection.  Extensive paranasal sinus disease. Prior right parietal scalp hematoma. Scattered foci of arterial vascular calcification.   Electronically Signed   By: Bretta BangWilliam  Woodruff III M.D.   On: 11/05/2016 12:40    ____________________________________________   PROCEDURES  Procedure(s) performed:   Procedures  Critical  Care  performed:   ____________________________________________   INITIAL IMPRESSION / ASSESSMENT AND PLAN / ED COURSE  Pertinent labs & imaging results that were available during my care of the patient were reviewed by me and considered in my medical decision making (see chart for details).    Clinical Course      ____________________________________________   FINAL CLINICAL IMPRESSION(S) / ED DIAGNOSES  Final diagnoses:  Hypoxia  SOB (shortness of breath)  Community acquired pneumonia of left lower lobe of lung (HCC)      NEW MEDICATIONS STARTED DURING THIS VISIT:  New Prescriptions   No medications on file     Note:  This document was prepared using Dragon voice recognition software and may include unintentional dictation errors.    Arnaldo Natal, MD 11/05/16 1255

## 2016-11-05 NOTE — Progress Notes (Signed)
Family Meeting Note  Advance Directive:yes  Today a meeting took place with the Patient.and spouse The following clinical team members were present during this meeting:MD  The following were discussed:Patient's diagnosis: Acute hypoxic respiratory failure due to community-acquired pneumonia COPD Chronic pain, Patient's progosis: Unable to determine and Goals for treatment: Limited code  Additional follow-up to be provided: Patient has been his healthcare power of attorney   Time spent during discussion:16 minutes  Donette Mainwaring, Patricia PesaSITAL, MD

## 2016-11-05 NOTE — Progress Notes (Signed)
Subjective:     Patient ID: Cheryl [name], female   DOB: 12/18/1950, 65 y.o.   MRN: [id number]  HPI   Review of Systems     Objective:   Physical Exam     Assessment:        Plan:

## 2016-11-05 NOTE — Progress Notes (Signed)
PT Cancellation Note  Patient Details Name: Sueanne MargaritaMary George Kistler MRN: [id number] DOB: 04-17-1951   Cancelled Treatment:    Reason Eval/Treat Not Completed: Medical issues which prohibited therapy (on Venturi Mask).  Pt not currently appropriate for exertional activity but PT will continue to follow acutely.   Encarnacion ChuAshley Alayia Meggison PT, DPT 11/05/2016, 2:40 PM

## 2016-11-06 DIAGNOSIS — E43 Unspecified severe protein-calorie malnutrition: Secondary | ICD-10-CM | POA: Insufficient documentation

## 2016-11-06 LAB — BASIC METABOLIC PANEL
ANION GAP: 8 (ref 5–15)
BUN: 23 mg/dL — ABNORMAL HIGH (ref 6–20)
CALCIUM: 8.3 mg/dL — AB (ref 8.9–10.3)
CHLORIDE: 98 mmol/L — AB (ref 101–111)
CO2: 30 mmol/L (ref 22–32)
Creatinine, Ser: 0.7 mg/dL (ref 0.44–1.00)
GFR calc non Af Amer: >60 mL/min (ref >60)
GLUCOSE: 117 mg/dL — AB (ref 65–99)
Potassium: 3.5 mmol/L (ref 3.5–5.1)
Sodium: 136 mmol/L (ref 135–145)

## 2016-11-06 LAB — CBC
HCT: 29.9 % — ABNORMAL LOW (ref 35.0–47.0)
HEMOGLOBIN: 10 g/dL — AB (ref 12.0–16.0)
MCH: 28.5 pg (ref 26.0–34.0)
MCHC: 33.4 g/dL (ref 32.0–36.0)
MCV: 85.3 fL (ref 80.0–100.0)
Platelets: 227 10*3/uL (ref 150–440)
RBC: 3.51 MIL/uL — AB (ref 3.80–5.20)
RDW: 16.5 % — ABNORMAL HIGH (ref 11.5–14.5)
WBC: 10 10*3/uL (ref 3.6–11.0)

## 2016-11-06 LAB — URINE CULTURE: CULTURE: NO GROWTH

## 2016-11-06 LAB — PROCALCITONIN: PROCALCITONIN: 0.47 ng/mL

## 2016-11-06 MED ORDER — ENOXAPARIN SODIUM 30 MG/0.3ML ~~LOC~~ SOLN
30.0000 mg | SUBCUTANEOUS | Status: DC
  Administered 2016-11-06: 30 mg via SUBCUTANEOUS
  Filled 2016-11-06: qty 0.3

## 2016-11-06 MED ORDER — AZITHROMYCIN 250 MG PO TABS
500.0000 mg | ORAL_TABLET | Freq: Every day | ORAL | Status: DC
  Administered 2016-11-07: 500 mg via ORAL
  Filled 2016-11-06: qty 2

## 2016-11-06 MED ORDER — TRAMADOL HCL 50 MG PO TABS
100.0000 mg | ORAL_TABLET | Freq: Four times a day (QID) | ORAL | Status: DC | PRN
  Administered 2016-11-06 – 2016-11-07 (×3): 100 mg via ORAL
  Filled 2016-11-06 (×3): qty 2

## 2016-11-06 MED ORDER — TRAMADOL HCL 50 MG PO TABS
100.0000 mg | ORAL_TABLET | Freq: Four times a day (QID) | ORAL | Status: DC | PRN
  Administered 2016-11-06: 100 mg via ORAL
  Filled 2016-11-06: qty 2

## 2016-11-06 NOTE — Progress Notes (Signed)
Anticoagulation monitoring(Lovenox):  65yo  F ordered Lovenox 40 mg Q24h in low weight patient.  Filed Weights   11/05/16 1016 11/05/16 1700  Weight: 85 lb (38.6 kg) 93 lb 6.4 oz (42.4 kg)   BMI 17. 7  Lab Results  Component Value Date   CREATININE 0.70 11/06/2016   CREATININE 1.04 (H) 11/05/2016   CREATININE 0.68 06/05/2016   Estimated Creatinine Clearance: 45.7 mL/min (by C-G formula based on SCr of 0.7 mg/dL). Hemoglobin & Hematocrit     Component Value Date/Time   HGB 10.0 (L) 11/06/2016 0530   HGB 8.8 (L) 08/23/2014 0525   HCT 29.9 (L) 11/06/2016 0530   HCT 33.6 (L) 04/09/2016 1427     Per Protocol for Patient with estCrcl > 30 ml/min and Weight < 45 kg for Female pt, will transition to Lovenox 30 mg Q24h      Bari MantisKristin Kilea Mccarey PharmD Clinical Pharmacist 11/06/2016

## 2016-11-06 NOTE — Evaluation (Signed)
Physical Therapy Evaluation Patient Details Name: Cheryl [name] MRN: [id number] DOB: [birth year] Today's Date: 11/06/2016   History of Present Illness  Pt is a 65 y/o F who presented to her PCP office with lethargy and hypoxia. Chest x-ray revealed L lower lung pneumonia.  Pt's PMH includse CHF, chronic pain (followed at pain clinic), neuropathy, osteoporosis, migraines, DVT, cervical fusion, Bil foot surgery.    Clinical Impression  Pt admitted with above diagnosis. Pt currently with functional limitations due to the deficits listed below (see PT Problem List). Cheryl [name] is from home where is alone during the day while her husband is at work.  She reports ~25 falls over the past 6 months due to walking too quickly and tripping.  Pt received on 2L O2 with SpO2 84%.  Increased O2 to 3L but with stand pivot and ambulating around bed SpO2 down to 83% on 3L O2.  Pt left on 3L at end of session with SpO2 at 87%.  RN notified. Given pt's current mobility status and significant fall history, recommending SNF at d/c.  Pt will benefit from skilled PT to increase their independence and safety with mobility to allow discharge to the venue listed below.      Follow Up Recommendations SNF    Equipment Recommendations  None recommended by PT    Recommendations for Other Services OT consult     Precautions / Restrictions Precautions Precautions: Fall Precaution Comments: Pt reports 25 falls in the past 6 months Restrictions Weight Bearing Restrictions: No      Mobility  Bed Mobility Overal bed mobility: Needs Assistance Bed Mobility: Supine to Sit     Supine to sit: Min guard;HOB elevated     General bed mobility comments: Increased time and effort with use of bed rail.    Transfers Overall transfer level: Needs assistance Equipment used: Rolling walker (2 wheeled);1 person hand held assist Transfers: Sit to/from [employer]Stand;Stand Pivot Transfers Sit to Stand: Min assist Stand pivot  transfers: Mod assist       General transfer comment: Mod assist with 1 person HHA to steady and pivot to [facility].  Pt anxious about falling.  Min assist to steady with sit<>stand and cues for upright posture (limited due to kyphosis).  Ambulation/Gait Ambulation/Gait assistance: Min assist Ambulation Distance (Feet): 10 Feet Assistive device: Rolling walker (2 wheeled) Gait Pattern/deviations: Decreased stride length;Shuffle;Antalgic;Trunk flexed Gait velocity: decreased Gait velocity interpretation: <1.8 ft/sec, indicative of risk for recurrent falls General Gait Details: Flexed posture which pt is only able to improve minimally with verbal cues.  Cues for pursed lip breathing with SpO2 dropping as low a 83% on 3L O2.  Pt fatigues quickly and requires min A to steady and to manage RW.  Stairs            Wheelchair Mobility    Modified Rankin (Stroke Patients Only)       Balance Overall balance assessment: Needs assistance;History of Falls Sitting-balance support: No upper extremity supported;Feet supported Sitting balance-Leahy Scale: Fair     Standing balance support: Single extremity supported;During functional activity Standing balance-Leahy Scale: Poor Standing balance comment: Relies on UE support                             Pertinent Vitals/Pain Pain Assessment: 0-10 Pain Score: 4  Pain Location: BUEs Pain Descriptors / Indicators: Throbbing Pain Intervention(s): Monitored during session;Limited activity within patient's tolerance;Repositioned    Home Living Family/patient expects  to be discharged to:: Private residence Living Arrangements: Spouse/significant other Available Help at Discharge: Family;Available PRN/intermittently Type of Home: House Home Access: Ramped entrance     Home Layout: One level Home Equipment: Walker - 4 wheels;Cane - single point;Wheelchair - [employer]manual;Walker - 2 wheels;Bedside commode;Shower seat - built in;Hand held  shower head;Grab bars - toilet;Grab bars - tub/shower      Prior Function Level of Independence: Needs assistance   Gait / Transfers Assistance Needed: Ind with household ambulation using rollator.  Only leaves house to visit family 1x/wk using rollator and taking breaks to rest due to fatigue.  ADL's / Homemaking Assistance Needed: Husband assists with getting in/out of shower, but able to bathe indep once in shower (on seat).  Husband assisting with dressing.          Hand Dominance   Dominant Hand: Right    Extremity/Trunk Assessment   Upper Extremity Assessment Upper Extremity Assessment: Overall WFL for tasks assessed    Lower Extremity Assessment Lower Extremity Assessment: Overall WFL for tasks assessed    Cervical / Trunk Assessment Cervical / Trunk Assessment: Other exceptions;Kyphotic Cervical / Trunk Exceptions: Severe scoliosis and kyphosis. Lower L thoracic convex scoliosis.  Communication   Communication: No difficulties  Cognition Arousal/Alertness: Lethargic Behavior During Therapy: Flat affect Overall Cognitive Status: History of cognitive impairments - at baseline                      General Comments General comments (skin integrity, edema, etc.): Pt received on 2L O2 with SpO2 84%.  Increased O2 to 3L but with stand pivot and ambulating around bed SpO2 down to 83% on 3L O2.  Pt left on 3L at end of session with SpO2 at 87%.  RN notified.    Exercises Other Exercises Other Exercises: Provided pt with consistent max verbal cues throughout session for pursed lip breathing.   Assessment/Plan    PT Assessment Patient needs continued PT services  PT Problem List Decreased strength;Decreased activity tolerance;Decreased balance;Decreased mobility;Decreased cognition;Decreased knowledge of use of DME;Decreased safety awareness;Cardiopulmonary status limiting activity;Pain;Decreased knowledge of precautions          PT Treatment Interventions DME  instruction;Gait training;Functional mobility training;Therapeutic activities;Therapeutic exercise;Balance training;Patient/family education;Modalities;Cognitive remediation;Neuromuscular re-education    PT Goals (Current goals can be found in the Care Plan section)  Acute Rehab PT Goals Patient Stated Goal: to feel better and go home after rehab PT Goal Formulation: With patient/family Time For Goal Achievement: 11/20/16 Potential to Achieve Goals: Good    Frequency Min 2X/week   Barriers to discharge Decreased caregiver support Alone during the day    Co-evaluation               End of Session Equipment Utilized During Treatment: Gait belt;Oxygen Activity Tolerance: Treatment limited secondary to medical complications (Comment) (hypoxia) Patient left: in chair;with call bell/phone within reach;with chair alarm set;with family/visitor present Nurse Communication: Mobility status;Other (comment) (SpO2)         Time: [id number] PT Time Calculation (min) (ACUTE ONLY): 29 min   Charges:   PT Evaluation $PT Eval Moderate Complexity: 1 Procedure PT Treatments $Therapeutic Activity: 8-22 mins   PT G Codes:       Encarnacion ChuAshley Abashian PT, DPT 11/06/2016, 11:26 AM

## 2016-11-06 NOTE — Progress Notes (Signed)
CONCERNING: Antibiotic IV to Oral Route Change Policy  RECOMMENDATION: This patient is receiving azithromycin by the intravenous route.  Based on criteria approved by the Pharmacy and Therapeutics Committee, the antibiotic(s) is/are being converted to the equivalent oral dose form(s).   DESCRIPTION: These criteria include:  Patient being treated for a respiratory tract infection, urinary tract infection, cellulitis or clostridium difficile associated diarrhea if on metronidazole  The patient is not neutropenic and does not exhibit a GI malabsorption state  The patient is eating (either orally or via tube) and/or has been taking other orally administered medications for a least 24 hours  The patient is improving clinically and has a Tmax < 100.5  If you have questions about this conversion, please contact the Pharmacy Department  []  ( [phone] )  [place] [x]  ( [phone] )  [facility] []  ( [phone]  [place] []  ( [phone]  [facility] []  ( [phone] )  [facility]   

## 2016-11-06 NOTE — Progress Notes (Signed)
Family Meeting Note  Advance Directive:yes  Today a meeting took place with the Patient & RN  The following clinical team members were present during this meeting:MD and RN  The following were discussed:Patient's diagnosis: , Patient's progosis: < 12 months and Goals for treatment: Full Code  Additional follow-up to be provided: Palliative care eval while here. She decided to change her code back to Full Code.   Time spent during discussion:20 minutes  Delfino LovettVipul Bridgett Hattabaugh, MD

## 2016-11-06 NOTE — Progress Notes (Signed)
Patient BP is better 112/49. No other issues at this time.

## 2016-11-06 NOTE — Progress Notes (Signed)
[facility] [place] [facility]   PATIENT NAME: Cheryl [name]    MR#:  [id number]  DATE OF BIRTH:  10/14/1951  SUBJECTIVE:  CHIEF COMPLAINT:   Chief Complaint  Patient presents with  . Shortness of Breath  was lethargic earlier so fentanyl held. She is wanting all her pain meds. Having struggle to breathe REVIEW OF SYSTEMS:  Review of Systems  Constitutional: Positive for malaise/fatigue. Negative for chills, fever and weight loss.  HENT: Negative for nosebleeds and sore throat.   Eyes: Negative for blurred vision.  Respiratory: Positive for shortness of breath. Negative for cough and wheezing.   Cardiovascular: Negative for chest pain, orthopnea, leg swelling and PND.  Gastrointestinal: Negative for abdominal pain, constipation, diarrhea, heartburn, nausea and vomiting.  Genitourinary: Negative for dysuria and urgency.  Musculoskeletal: Positive for back pain.  Skin: Negative for rash.  Neurological: Positive for weakness. Negative for dizziness, speech change, focal weakness and headaches.  Endo/Heme/Allergies: Does not bruise/bleed easily.  Psychiatric/Behavioral: Negative for depression.   DRUG ALLERGIES:   Allergies  Allergen Reactions  . Percocet [Oxycodone-Acetaminophen] Hives and Rash  . Aspirin Nausea And Vomiting and Other (See Comments)    Reaction:  GI upset   . Codeine Nausea And Vomiting and Other (See Comments)    Reaction:  GI upset   . Propoxyphene Other (See Comments) and Nausea Only    GI upset Reaction:  GI upset   . Sulfa Antibiotics Rash and Other (See Comments)    Reaction:  GI upset    VITALS:  Blood pressure (!) 112/46, pulse 72, temperature 97.6 F (36.4 C), temperature source Oral, resp. rate 16, height 5\' 1"  (1.549 m), weight 42.4 kg (93 lb 6.4 oz), SpO2 91 %. PHYSICAL EXAMINATION:  Physical Exam  Constitutional: She is oriented to person, place, and time. Vital signs are normal. She appears lethargic, to be writhing in  pain and malnourished. She appears unhealthy. She appears cachectic. She appears toxic. She has a sickly appearance.  HENT:  Head: Normocephalic and atraumatic.  Eyes: Conjunctivae and EOM are normal. Pupils are equal, round, and reactive to light.  Neck: Normal range of motion. Neck supple. No tracheal deviation present. No thyromegaly present.  Cardiovascular: Normal rate, regular rhythm and normal heart sounds.   Pulmonary/Chest: She is in respiratory distress. She has decreased breath sounds in the right lower field and the left lower field. She has no wheezes. She has rhonchi. She exhibits no tenderness.  Abdominal: Soft. Bowel sounds are normal. She exhibits no distension. There is no tenderness.  Musculoskeletal: Normal range of motion.  Neurological: She is oriented to person, place, and time. She appears lethargic. No cranial nerve deficit.  Skin: Skin is warm and dry. No rash noted.  Psychiatric: Mood and affect normal.   LABORATORY PANEL:   CBC  Recent Labs Lab 11/06/16 0530  WBC 10.0  HGB 10.0*  HCT 29.9*  PLT 227   ------------------------------------------------------------------------------------------------------------------ Chemistries   Recent Labs Lab 11/05/16 1029 11/06/16 0530  NA 133* 136  K 3.4* 3.5  CL 92* 98*  CO2 28 30  GLUCOSE 70 117*  BUN 23* 23*  CREATININE 1.04* 0.70  CALCIUM 8.2* 8.3*  AST 21  --   ALT 10*  --   ALKPHOS 124  --   BILITOT 1.2  --    RADIOLOGY:  No results found. ASSESSMENT AND PLAN:  65 year old female with COPD, tobacco dependence and chronic pain who presents with lethargy and acute  hypoxic respiratory failure due to pneumonia.  1. Acute encephalopathy in the setting of community-acquired pneumonia: overuse of pain medication including fentanyl and/or tramadol - Fentanyl has been stopped Head CT without ICH/CVA.  2. Community-acquired pneumonia: Continue Rocephin and azithromycin  3. Acute hypoxic respiratory  failure in the setting of community-acquired pneumonia without signs of COPD exacerbation: Treat for pneumonia and wean oxygen as tolerated  4. COPD without acute exacerbation: Continue inhalers  5. Chronic pain with long-term current use of opiate analgesics: She follows at pain clinic. Concern for overuse of pain medication including fentanyl and/or tramadol - Fentanyl has been stopped There have been no new medication changes.   6. Hyponatremia due to pneumonia/poor by mouth intake Improved with IV fluids  7. Tobacco dependence: Patient counseled on the importance of stopping smoking. She reports that she was smoking 3 packs a day and now is down to one pack a day. Patient counseled for 3 minutes.   Get physical therapy evaluation to assess need for placement  All the records are reviewed and case discussed with Care Management/Social Worker. Management plans discussed with the patient, family and they are in agreement.  CODE STATUS: Full code, will request palliative care consultation.  at very high risk for readmission  TOTAL TIME TAKING CARE OF THIS PATIENT: 35 minutes.   More than 50% of the time was spent in counseling/coordination of care: YES  POSSIBLE D/C IN 1-2 DAYS, DEPENDING ON CLINICAL CONDITION.  Likely needs skilled nursing facility   Delfino LovettVipul Giuseppe Duchemin M.D on 11/06/2016 at 5:05 PM  Between 7am to 6pm - Pager - [phone]  After 6pm go to [email or web address] - Social research officer, governmentpassword [email or web address] [email or web address]  [facility] [place] [facility]  Office  [phone]  CC: Primary care physician; Mila Merryonald Fisher, MD  Note: This dictation was prepared with Dragon dictation along with smaller phrase technology. Any transcriptional errors that result from this process are unintentional.

## 2016-11-06 NOTE — Progress Notes (Signed)
Patient BP is 97/37 while patient is setting  up.. Alert and oriented. Put patient back to bed laid flat. Will recheck BP again in 30 mins. We will continue to monitor.

## 2016-11-06 NOTE — Progress Notes (Signed)
Initial Nutrition Assessment  DOCUMENTATION CODES:   Severe malnutrition in context of chronic illness  INTERVENTION:   Magic cup TID with meals, each supplement provides 290 kcal and 9 grams of protein  NUTRITION DIAGNOSIS:   Malnutrition related to catabolic illness as evidenced by severe depletion of body fat, severe depletion of muscle mass.  GOAL:   Patient will meet greater than or equal to 90% of their needs  MONITOR:   PO intake, Supplement acceptance  REASON FOR ASSESSMENT:   Other (Comment) (low BMI)    ASSESSMENT:   65 y.o. female with a known history of COPD on PRN O2 at home and chronic pain who Presents from PCP office with lethargy and hypoxia. Chest x-ray shows left lower lung pneumonia.   Met with pt in room today. Pt is a poor historian; unable to get nutrition history for this pt. Pt reports that her appetite is poor. Pt's breakfast tray was untouched but pt was eating ice cream while RD was in room. Pt reports that she does not like Ensure or Boost. Per chart pt eating 100% meals yesterday. Pts weights have fluctuated but it does appear that pt has lost weight; approximately 9% over the past 7 months. This would be significant when disease state considered. Will order Magic Cups since pt likes ice cream. Poor prognosis; < 12 months for this pt per MD note.  Medications reviewed and include: zithromax, ceftriaxone, plavix, aricept, ogen, lasix, solu-medrol, protonix, KCl, tramadol    Labs reviewed: CL 98(L), BUN 23(H), Ca 8.3(L), ALT 10(L)   Nutrition-Focused physical exam completed. Findings are severe fat depletion, severe muscle depletion, and no edema.   Diet Order:  Diet Heart Room service appropriate? Yes; Fluid consistency: Thin  Skin:  Reviewed, no issues  Last BM:  none since admit  Height:   Ht Readings from Last 1 Encounters:  11/05/16 '5\' 1"'$  (1.549 m)    Weight:   Wt Readings from Last 1 Encounters:  11/05/16 93 lb 6.4 oz (42.4 kg)     Ideal Body Weight:  47.7 kg  BMI:  Body mass index is 17.65 kg/m.  Estimated Nutritional Needs:   Kcal:  1400-1700kcal/day   Protein:  62-72g/day   Fluid:  1.5L/day   EDUCATION NEEDS:   No education needs identified at this time  Koleen Distance, RD, LDN

## 2016-11-06 NOTE — NC FL2 (Signed)
[place] MEDICAID FL2 LEVEL OF CARE SCREENING TOOL     IDENTIFICATION  Patient Name: Cheryl [name] Birthdate: 1951/05/18 Sex: female Admission Date (Current Location): 11/05/2016  [place]   Facility and Address:  [facility], [address], [place], [place] [address]      Provider Number: [id number]  Attending Physician Name and Address:  Delfino LovettVipul Shah, MD  Relative Name and Phone Number:       Current Level of Care: Hospital Recommended Level of Care: Skilled Nursing Facility Prior Approval Number:    Date Approved/Denied:   PASRR Number: [id number]  Discharge Plan: SNF    Current Diagnoses: Patient Active Problem List   Diagnosis Date Noted  . CAP (community acquired pneumonia) 11/05/2016  . Nausea with vomiting   . Pneumonia 06/03/2016  . Lumbar facet syndrome (Location of Primary Source of Pain) (Bilateral) 03/20/2016  . Lumbar spondylosis 03/20/2016  . Opiate use (160 MME/Day) 02/28/2016  . Anemia 02/28/2016  . Osteoarthrosis 12/18/2015  . Long term current use of anticoagulant therapy (Plavix) 12/18/2015  . Long term current use of opiate analgesic 12/04/2015  . Fibromyalgia 12/04/2015  . Dysphagia 11/24/2015  . Acute diastolic CHF (congestive heart failure) (HCC) 11/24/2015  . Abnormal mammogram of right breast 10/11/2015  . Dilated intrahepatic bile duct 10/11/2015  . Lumbar radicular pain 09/04/2015  . Chronic low back pain 09/04/2015  . Encounter for therapeutic drug level monitoring 09/04/2015  . Uncomplicated opioid dependence (HCC) 09/04/2015  . Chronic pain syndrome 09/04/2015  . Platelet inhibition due to Plavix 09/04/2015  . COPD (chronic obstructive pulmonary disease) (HCC) 07/31/2015  . Dementia 07/31/2015  . Airway hyperreactivity 07/03/2015  . Back pain, thoracic 07/03/2015  . Excessive falling 07/03/2015  . Alteration in bowel elimination: incontinence 07/03/2015  . Insomnia  07/03/2015  . Decreased testosterone level 07/03/2015  . Leg weakness 07/03/2015  . Menopausal symptom 07/03/2015  . Migraine 07/03/2015  . Neuropathy (HCC) 07/03/2015  . Fecal occult blood test positive 07/03/2015  . OP (osteoporosis) 07/03/2015  . Panic disorder 07/03/2015  . Compulsive tobacco user syndrome 07/03/2015  . Urinary incontinence 07/03/2015  . Weight loss 07/03/2015  . Essential hypertension 06/06/2015  . GERD (gastroesophageal reflux disease) 06/06/2015  . Hyperlipemia 06/06/2015  . 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 Anxiety 02/10/2014  . Coronary artery disease 02/10/2014  . Hypercholesteremia 02/10/2014  . Peripheral vascular disease (HCC) 02/10/2014  . Vitamin D deficiency 10/16/2009    Orientation RESPIRATION BLADDER Height & Weight     Self, Time, Situation, Place  O2 (3 liters) Continent Weight: 93 lb 6.4 oz (42.4 kg) Height:  5\' 1"  (154.9 cm)  BEHAVIORAL SYMPTOMS/MOOD NEUROLOGICAL BOWEL NUTRITION STATUS   (none)  (none) Continent    AMBULATORY STATUS COMMUNICATION OF NEEDS Skin   Extensive Assist Verbally Normal                       Personal Care Assistance Level of Assistance  Bathing, Dressing Bathing Assistance: Limited assistance   Dressing Assistance: Limited assistance     Functional Limitations Info   (no issues)          SPECIAL CARE FACTORS FREQUENCY  PT (By licensed PT)                    Contractures Contractures Info: Not present    Additional Factors Info  Code Status, Allergies Code Status Info: full Allergies Info: percocet  Current Medications (11/06/2016):  This is the current hospital active medication list Current Facility-Administered Medications  Medication Dose Route Frequency Provider Last Rate Last Dose  . 0.9 %  sodium chloride infusion   Intravenous Continuous Adrian Saran, MD 75 mL/hr at 11/06/16 0310    . acetaminophen (TYLENOL) tablet 1,000 mg  1,000 mg Oral Q6H PRN  Sital Mody, MD      . albuterol (PROVENTIL) (2.5 MG/3ML) 0.083% nebulizer solution 2.5 mg  2.5 mg Inhalation Q4H PRN Adrian Saran, MD      . azithromycin (ZITHROMAX) 500 mg in dextrose 5 % 250 mL IVPB  500 mg Intravenous Q24H Adrian Saran, MD   500 mg at 11/06/16 0925  . cefTRIAXone (ROCEPHIN) IVPB 1 g  1 g Intravenous Q24H Olene Floss, RPH   1 g at 11/06/16 0925  . clopidogrel (PLAVIX) tablet 75 mg  75 mg Oral Daily Adrian Saran, MD   75 mg at 11/06/16 0925  . cyclobenzaprine (FLEXERIL) tablet 5-10 mg  5-10 mg Oral TID PRN Adrian Saran, MD   10 mg at 11/06/16 1215  . diphenoxylate-atropine (LOMOTIL) 2.5-0.025 MG per tablet 2 tablet  2 tablet Oral QID PRN Adrian Saran, MD      . donepezil (ARICEPT) tablet 10 mg  10 mg Oral QHS Sital Mody, MD      . enoxaparin (LOVENOX) injection 30 mg  30 mg Subcutaneous Q24H Sital Mody, MD      . Melene Muller ON 11/07/2016] estropipate (OGEN) tablet 0.75 mg  0.75 mg Oral QODAY Sital Mody, MD      . fluticasone (FLONASE) 50 MCG/ACT nasal spray 2 spray  2 spray Each Nare Daily Adrian Saran, MD   2 spray at 11/06/16 0924  . furosemide (LASIX) tablet 40 mg  40 mg Oral Daily Adrian Saran, MD   40 mg at 11/06/16 0924  . ipratropium-albuterol (DUONEB) 0.5-2.5 (3) MG/3ML nebulizer solution 3 mL  3 mL Nebulization Q6H Sital Mody, MD   3 mL at 11/06/16 0854  . methylPREDNISolone sodium succinate (SOLU-MEDROL) 125 mg/2 mL injection 60 mg  60 mg Intravenous BID Adrian Saran, MD   60 mg at 11/06/16 0924  . mometasone-formoterol (DULERA) 200-5 MCG/ACT inhaler 2 puff  2 puff Inhalation BID Adrian Saran, MD   2 puff at 11/06/16 0924  . ondansetron (ZOFRAN) tablet 4 mg  4 mg Oral Q6H PRN Adrian Saran, MD       Or  . ondansetron (ZOFRAN) injection 4 mg  4 mg Intravenous Q6H PRN Sital Mody, MD      . pantoprazole (PROTONIX) EC tablet 40 mg  40 mg Oral BID Adrian Saran, MD   40 mg at 11/06/16 0923  . potassium chloride (K-DUR,KLOR-CON) CR tablet 10 mEq  10 mEq Oral Daily Adrian Saran, MD   10 mEq at  11/06/16 0924  . pregabalin (LYRICA) capsule 150 mg  150 mg Oral Q8H Sital Mody, MD   150 mg at 11/06/16 1327  . senna-docusate (Senokot-S) tablet 1 tablet  1 tablet Oral QHS PRN Adrian Saran, MD      . simvastatin (ZOCOR) tablet 10 mg  10 mg Oral QHS Sital Mody, MD      . tiotropium (SPIRIVA) inhalation capsule 18 mcg  18 mcg Inhalation Daily Adrian Saran, MD   18 mcg at 11/06/16 0925  . traMADol (ULTRAM) tablet 100 mg  100 mg Oral Q6H PRN Delfino Lovett, MD   100 mg at 11/06/16 1012     Discharge Medications:  Please see discharge summary for a list of discharge medications.  Relevant Imaging Results:  Relevant Lab Results:   Additional Information SS: [id number]  York SpanielMonica Kamorah Nevils, LCSW

## 2016-11-07 ENCOUNTER — Telehealth: Payer: Self-pay | Admitting: Family Medicine

## 2016-11-07 DIAGNOSIS — E785 Hyperlipidemia, unspecified: Secondary | ICD-10-CM | POA: Diagnosis not present

## 2016-11-07 DIAGNOSIS — G9340 Encephalopathy, unspecified: Secondary | ICD-10-CM | POA: Diagnosis not present

## 2016-11-07 DIAGNOSIS — F411 Generalized anxiety disorder: Secondary | ICD-10-CM | POA: Diagnosis not present

## 2016-11-07 DIAGNOSIS — F0280 Dementia in other diseases classified elsewhere without behavioral disturbance: Secondary | ICD-10-CM | POA: Diagnosis not present

## 2016-11-07 DIAGNOSIS — R4182 Altered mental status, unspecified: Secondary | ICD-10-CM | POA: Diagnosis not present

## 2016-11-07 DIAGNOSIS — J181 Lobar pneumonia, unspecified organism: Secondary | ICD-10-CM | POA: Diagnosis not present

## 2016-11-07 DIAGNOSIS — N390 Urinary tract infection, site not specified: Secondary | ICD-10-CM | POA: Diagnosis not present

## 2016-11-07 DIAGNOSIS — Z736 Limitation of activities due to disability: Secondary | ICD-10-CM | POA: Diagnosis not present

## 2016-11-07 DIAGNOSIS — I2510 Atherosclerotic heart disease of native coronary artery without angina pectoris: Secondary | ICD-10-CM | POA: Diagnosis not present

## 2016-11-07 DIAGNOSIS — R509 Fever, unspecified: Secondary | ICD-10-CM | POA: Diagnosis not present

## 2016-11-07 DIAGNOSIS — G8929 Other chronic pain: Secondary | ICD-10-CM | POA: Diagnosis not present

## 2016-11-07 DIAGNOSIS — M6281 Muscle weakness (generalized): Secondary | ICD-10-CM | POA: Diagnosis not present

## 2016-11-07 DIAGNOSIS — J441 Chronic obstructive pulmonary disease with (acute) exacerbation: Secondary | ICD-10-CM | POA: Diagnosis not present

## 2016-11-07 DIAGNOSIS — J449 Chronic obstructive pulmonary disease, unspecified: Secondary | ICD-10-CM | POA: Diagnosis not present

## 2016-11-07 DIAGNOSIS — J189 Pneumonia, unspecified organism: Secondary | ICD-10-CM | POA: Diagnosis not present

## 2016-11-07 DIAGNOSIS — G894 Chronic pain syndrome: Secondary | ICD-10-CM | POA: Diagnosis not present

## 2016-11-07 DIAGNOSIS — R110 Nausea: Secondary | ICD-10-CM | POA: Diagnosis not present

## 2016-11-07 DIAGNOSIS — R2681 Unsteadiness on feet: Secondary | ICD-10-CM | POA: Diagnosis not present

## 2016-11-07 DIAGNOSIS — J9621 Acute and chronic respiratory failure with hypoxia: Secondary | ICD-10-CM | POA: Diagnosis not present

## 2016-11-07 DIAGNOSIS — G609 Hereditary and idiopathic neuropathy, unspecified: Secondary | ICD-10-CM | POA: Diagnosis not present

## 2016-11-07 LAB — BASIC METABOLIC PANEL
ANION GAP: 6 (ref 5–15)
BUN: 20 mg/dL (ref 6–20)
CALCIUM: 8.2 mg/dL — AB (ref 8.9–10.3)
CO2: 30 mmol/L (ref 22–32)
Chloride: 103 mmol/L (ref 101–111)
Creatinine, Ser: 0.66 mg/dL (ref 0.44–1.00)
GLUCOSE: 132 mg/dL — AB (ref 65–99)
POTASSIUM: 3.8 mmol/L (ref 3.5–5.1)
SODIUM: 139 mmol/L (ref 135–145)

## 2016-11-07 LAB — CBC
HCT: 29.7 % — ABNORMAL LOW (ref 35.0–47.0)
Hemoglobin: 9.9 g/dL — ABNORMAL LOW (ref 12.0–16.0)
MCH: 28.4 pg (ref 26.0–34.0)
MCHC: 33.3 g/dL (ref 32.0–36.0)
MCV: 85.5 fL (ref 80.0–100.0)
PLATELETS: 227 10*3/uL (ref 150–440)
RBC: 3.47 MIL/uL — AB (ref 3.80–5.20)
RDW: 17.1 % — AB (ref 11.5–14.5)
WBC: 9 10*3/uL (ref 3.6–11.0)

## 2016-11-07 MED ORDER — ALPRAZOLAM 0.25 MG PO TABS: 0.2500 mg | ORAL_TABLET | Freq: Four times a day (QID) | ORAL | 0 refills | Status: DC | PRN

## 2016-11-07 MED ORDER — LEVOFLOXACIN 500 MG PO TABS: 500.0000 mg | ORAL_TABLET | Freq: Every day | ORAL | 0 refills | Status: DC

## 2016-11-07 MED ORDER — TRAMADOL HCL 50 MG PO TABS: 100.0000 mg | ORAL_TABLET | Freq: Four times a day (QID) | ORAL | 0 refills | Status: DC | PRN

## 2016-11-07 MED ORDER — PREDNISONE 10 MG (21) PO TBPK: 10.0000 mg | ORAL_TABLET | Freq: Every day | ORAL | 0 refills | Status: DC

## 2016-11-07 MED ORDER — FENTANYL 25 MCG/HR TD PT72
25.0000 ug | MEDICATED_PATCH | TRANSDERMAL | 0 refills | Status: DC
Start: 2016-11-07 — End: 2016-12-05

## 2016-11-07 MED ORDER — PREGABALIN 150 MG PO CAPS: 150.0000 mg | ORAL_CAPSULE | Freq: Three times a day (TID) | ORAL | 0 refills | Status: DC

## 2016-11-07 NOTE — Clinical Social Work Note (Signed)
Patient's husband to transport patient to [facility]. York SpanielMonica Leomia Blake MSW,LcSW [phone]

## 2016-11-07 NOTE — Telephone Encounter (Signed)
Pt is scheduled for Hospital F/U on Thursday 11/14/16 @ 1130. Pt is being discharged today and was treated for CAP. Thanks TNP

## 2016-11-07 NOTE — Progress Notes (Signed)
IV was removed. Discharge instructions and follow-up appointments were provided to the pt and husband at bedside. All questions were answered. The pt was taken downstairs via wheelchair by volunteer.

## 2016-11-07 NOTE — Clinical Social Work Placement (Signed)
   CLINICAL SOCIAL WORK PLACEMENT  NOTE  Date:  11/07/2016  Patient Details  Name: Cheryl [name] MRN: [id number] Date of Birth: 02-14-1951  Clinical Social Work is seeking post-discharge placement for this patient at the Skilled  Nursing Facility level of care (*CSW will initial, date and re-position this form in  chart as items are completed):  Yes   Patient/family provided with [facility] Clinical Social Work Department's list of facilities offering this level of care within the geographic area requested by the patient (or if unable, by the patient's family).  Yes   Patient/family informed of their freedom to choose among providers that offer the needed level of care, that participate in Medicare, Medicaid or managed care program needed by the patient, have an available bed and are willing to accept the patient.  Yes   Patient/family informed of [facility]'s ownership interest in [facility] and [facility], as well as of the fact that they are under no obligation to receive care at these facilities.  PASRR submitted to EDS on       PASRR number received on       Existing PASRR number confirmed on 11/06/16     FL2 transmitted to all facilities in geographic area requested by pt/family on 11/06/16     FL2 transmitted to all facilities within larger geographic area on       Patient informed that his/her managed care company has contracts with or will negotiate with certain facilities, including the following:        Yes   Patient/family informed of bed offers received.  Patient chooses bed at  [facility])     Physician recommends and patient chooses bed at  [facility](SNF)    Patient to be transferred to  [facility]) on 11/07/16.  Patient to be transferred to facility by       Patient family notified on 11/07/16 of transfer.  Name of family member notified:  daughter     PHYSICIAN       Additional Comment:     _______________________________________________ York SpanielMonica Neeya Prigmore, LCSW 11/07/2016, 1:38 PM

## 2016-11-07 NOTE — Clinical Social Work Note (Signed)
Clinical Social Work Assessment  Patient Details  Name: Cheryl [name] MRN: [id number] Date of Birth: 01-Dec-1950  Date of referral:  11/07/16               Reason for consult:  Facility Placement                Permission sought to share information with:  Facility Sport and exercise psychologist, Family Supports Permission granted to share information::  Yes, Verbal Permission Granted  Name::        Agency::     Relationship::     Contact Information:     Housing/Transportation Living arrangements for the past 2 months:  Single Family Home Source of Information:  Patient Patient Interpreter Needed:  None Criminal Activity/Legal Involvement Pertinent to Current Situation/Hospitalization:  No - Comment as needed Significant Relationships:  Adult Children Lives with:  Self Do you feel safe going back to the place where you live?  Yes Need for family participation in patient care:  No (Coment)  Care giving concerns:  PT has recommended STR and patient resides at home.   Social Worker assessment / plan:  MD to discharge patient today. CSW met with patient and daughter and informed that the facility that they had expressed that they wanted to the MD was able to offer. Patient has accepted bed offer from [facility]. CSW awaiting prior auth from [facility] prior to sending patient today. Discharge information sent to [facility].  Employment status:    Nurse, adult PT Recommendations:  [place] / Referral to community resources:  [place]  Patient/Family's Response to care:  Patient expressed appreciation for CSW assistance.  Patient/Family's Understanding of and Emotional Response to Diagnosis, Current Treatment, and Prognosis:  Patient agreeable that she could benefit from rehab.  Emotional Assessment Appearance:  Appears older than stated age Attitude/Demeanor/Rapport:   (pleasant and cooperative) Affect (typically  observed):  Calm, Adaptable, Accepting Orientation:  Oriented to Self, Oriented to Place, Oriented to Situation Alcohol / Substance use:  Not Applicable Psych involvement (Current and /or in the community):  No (Comment)  Discharge Needs  Concerns to be addressed:  Care Coordination Readmission within the last 30 days:  No Current discharge risk:  None Barriers to Discharge:  No Barriers Identified   Shela Leff, LCSW 11/07/2016, 12:17 PM

## 2016-11-07 NOTE — Discharge Instructions (Signed)
Community-Acquired Pneumonia, Adult °Introduction °Pneumonia is an infection of the lungs. One type of pneumonia can happen while a person is in a hospital. A different type can happen when a person is not in a hospital (community-acquired pneumonia). It is easy for this kind to spread from person to person. It can spread to you if you breathe near an infected person who coughs or sneezes. Some symptoms include: °· A dry cough. °· A wet (productive) cough. °· Fever. °· Sweating. °· Chest pain. °Follow these instructions at home: °· Take over-the-counter and prescription medicines only as told by your doctor. °¨ Only take cough medicine if you are losing sleep. °¨ If you were prescribed an antibiotic medicine, take it as told by your doctor. Do not stop taking the antibiotic even if you start to feel better. °· Sleep with your head and neck raised (elevated). You can do this by putting a few pillows under your head, or you can sleep in a recliner. °· Do not use tobacco products. These include cigarettes, chewing tobacco, and e-cigarettes. If you need help quitting, ask your doctor. °· Drink enough water to keep your pee (urine) clear or pale yellow. °A shot (vaccine) can help prevent pneumonia. Shots are often suggested for: °· People older than 67 years of age. °· People older than 67 years of age: °¨ Who are having cancer treatment. °¨ Who have long-term (chronic) lung disease. °¨ Who have problems with their body's defense system (immune system). °You may also prevent pneumonia if you take these actions: °· Get the flu (influenza) shot every year. °· Go to the dentist as often as told. °· Wash your hands often. If soap and water are not available, use hand sanitizer. °Contact a doctor if: °· You have a fever. °· You lose sleep because your cough medicine does not help. °Get help right away if: °· You are short of breath and it gets worse. °· You have more chest pain. °· Your sickness gets worse. This is very  serious if: °¨ You are an older adult. °¨ Your body's defense system is weak. °· You cough up blood. °This information is not intended to replace advice given to you by your health care provider. Make sure you discuss any questions you have with your health care provider. °Document Released: 04/29/2008 Document Revised: 04/18/2016 Document Reviewed: 03/08/2015 °© 2017 Elsevier ° °

## 2016-11-07 NOTE — Care Management Important Message (Signed)
Important Message  Patient Details  Name: Cheryl [name] MRN: [id number] Date of Birth: 09-13-1951   Medicare Important Message Given:  N/A - LOS <3 / Initial given by admissions    Chapman FitchBOWEN, Zayne Draheim T, RN 11/07/2016, 1:35 PM

## 2016-11-07 NOTE — Progress Notes (Signed)
Report called to Saunders RevelElizabeth Kelly, RN at [facility].

## 2016-11-07 NOTE — Clinical Social Work Note (Signed)
[facility] today: [id number]. York SpanielMonica Buford Gayler MSW,LCSW [phone]

## 2016-11-07 NOTE — Discharge Summary (Addendum)
[facility]   PATIENT NAME: Cheryl [name]    MR#:  [id number]  DATE OF BIRTH:  1951-09-18  DATE OF ADMISSION:  11/05/2016   ADMITTING PHYSICIAN: Adrian SaranSital Mody, MD  DATE OF DISCHARGE: 11/07/2016  PRIMARY CARE PHYSICIAN: Mila Merryonald Fisher, MD   ADMISSION DIAGNOSIS:  SOB (shortness of breath) [R06.02] Hypoxia [R09.02] Community acquired pneumonia of left lower lobe of lung (HCC) [J18.1] DISCHARGE DIAGNOSIS:  Active Problems:   CAP (community acquired pneumonia)   Protein-calorie malnutrition, severe  SECONDARY DIAGNOSIS:   Past Medical History:  Diagnosis Date  . Allergy   . Anxiety   . Arthritis   . Aspiration pneumonitis (HCC) 11/24/2015  . Asthma   . CHF (congestive heart failure) (HCC)   . Chronic pain   . COPD (chronic obstructive pulmonary disease) (HCC)   . Coronary artery disease   . DVT (deep venous thrombosis) (HCC)   . GERD (gastroesophageal reflux disease)   . Headache   . Hyperlipidemia   . Hypertension   . Migraines   . Neuropathy (HCC) 2010  . Osteoporosis   . Oxygen deficiency   . Peripheral vascular disease (HCC)   . Pneumonia   . Pneumonia 11/19/2015  . Vitamin D deficiency    HOSPITAL COURSE:  65 year old female with COPD, tobacco dependence and chronic pain who was admitted with lethargy and acute hypoxic respiratory failure due to pneumonia.  1. Acute encephalopathy in the setting of community-acquired pneumonia: overuse of pain medication including fentanyl and/or tramadol, also Benzo - Fentanyl has been stopped and on D/C I've cut back to 25 mcg. Also, cutting back on Xanax dose to 0.25 mg QID prn Head CT without ICH/CVA.  2. Community-acquired pneumonia: Improving on Abx  3. Acute hypoxic respiratory failure in the setting of community-acquired pneumonia without signs of COPD exacerbation:  4. COPD without acute exacerbation: Continue inhalers  5. Chronic pain with long-term current use of  opiate analgesics: She follows at pain clinic. Concern for overuse of pain medication including fentanyl and/or tramadol - Fentanyl had been stopped on admission, she had good inpt stay. Will cut back fentanyl to 25 mcg. Also, cutting back on Xanax dose to 0.25 mg QID prn as these r likely the meds contributing to her readmissions  6. Hyponatremia due to pneumonia/poor by mouth intake Resolved with IV fluids  7. Tobacco dependence: Patient counseled on the importance of stopping smoking. She reports that she was smoking 3 packs a day and now is down to one pack a day. Patient counseled for 3 minutes by admitting Dr  Kendell BaneISCHARGE CONDITIONS:  stable CONSULTS OBTAINED:   DRUG ALLERGIES:   Allergies  Allergen Reactions  . Percocet [Oxycodone-Acetaminophen] Hives and Rash  . Aspirin Nausea And Vomiting and Other (See Comments)    Reaction:  GI upset   . Codeine Nausea And Vomiting and Other (See Comments)    Reaction:  GI upset   . Propoxyphene Other (See Comments) and Nausea Only    GI upset Reaction:  GI upset   . Sulfa Antibiotics Rash and Other (See Comments)    Reaction:  GI upset    DISCHARGE MEDICATIONS:     Medication List    STOP taking these medications   fentaNYL 50 MCG/HR Commonly known as:  DURAGESIC - dosed mcg/hr Replaced by:  fentaNYL 25 MCG/HR patch     TAKE these medications   acetaminophen 500 MG tablet Commonly known as:  TYLENOL Take 1,000 mg by  mouth every 6 (six) hours as needed for mild pain or headache.   ADVAIR DISKUS 250-50 MCG/DOSE Aepb Generic drug:  Fluticasone-Salmeterol inhale 1 dose by mouth twice a day   albuterol 108 (90 Base) MCG/ACT inhaler Commonly known as:  PROVENTIL HFA;VENTOLIN HFA Inhale 2 puffs into the lungs every 4 (four) hours as needed for wheezing or shortness of breath.   albuterol (2.5 MG/3ML) 0.083% nebulizer solution Commonly known as:  PROVENTIL Take 3 mLs (2.5 mg total) by nebulization every 4 (four) hours as  needed for wheezing.   ALPRAZolam 0.25 MG tablet Commonly known as:  XANAX Take 1 tablet (0.25 mg total) by mouth 4 (four) times daily as needed for anxiety. What changed:  See the new instructions.   celecoxib 100 MG capsule Commonly known as:  CELEBREX Take 1 capsule (100 mg total) by mouth 2 (two) times daily as needed.   clopidogrel 75 MG tablet Commonly known as:  PLAVIX Take 75 mg by mouth daily.   cyclobenzaprine 5 MG tablet Commonly known as:  FLEXERIL take 1-2 tablets by mouth every 8 hours if needed   diphenoxylate-atropine 2.5-0.025 MG tablet Commonly known as:  LOMOTIL Take 2 tablets by mouth 4 (four) times daily as needed for diarrhea or loose stools. Reported on 03/11/2016   donepezil 10 MG tablet Commonly known as:  ARICEPT Take 10 mg by mouth at bedtime.   estropipate 0.75 MG tablet Commonly known as:  OGEN Take 1 tablet (0.75 mg total) by mouth every other day.   fentaNYL 25 MCG/HR patch Commonly known as:  DURAGESIC Place 1 patch (25 mcg total) onto the skin every 3 (three) days. Replaces:  fentaNYL 50 MCG/HR   fluticasone 50 MCG/ACT nasal spray Commonly known as:  FLONASE Place 2 sprays into both nostrils daily.   furosemide 40 MG tablet Commonly known as:  LASIX Take 40 mg by mouth daily.   levofloxacin 500 MG tablet Commonly known as:  LEVAQUIN Take 1 tablet (500 mg total) by mouth daily.   mupirocin ointment 2 % Commonly known as:  BACTROBAN Place 1 application into the nose 2 (two) times daily.   pantoprazole 40 MG tablet Commonly known as:  PROTONIX take 1 tablet by mouth twice a day   potassium chloride 10 MEQ tablet Commonly known as:  K-DUR,KLOR-CON Take 10 mEq by mouth daily.   predniSONE 10 MG (21) Tbpk tablet Commonly known as:  STERAPRED UNI-PAK 21 TAB Take 1 tablet (10 mg total) by mouth daily. Start 60 mg once daily, taper 10 mg daily until done   pregabalin 150 MG capsule Commonly known as:  LYRICA Take 1 capsule (150  mg total) by mouth 3 (three) times daily. What changed:  when to take this   simvastatin 10 MG tablet Commonly known as:  ZOCOR Take 10 mg by mouth at bedtime. Reported on 12/04/2015   SUMAtriptan 25 MG tablet Commonly known as:  IMITREX Take 25 mg by mouth as needed for migraine. May repeat in 2 hours if headache persists or recurs.   tiotropium 18 MCG inhalation capsule Commonly known as:  SPIRIVA Place 1 capsule (18 mcg total) into inhaler and inhale daily.   traMADol 50 MG tablet Commonly known as:  ULTRAM Take 2 tablets (100 mg total) by mouth every 6 (six) hours as needed for moderate pain or severe pain. What changed:  reasons to take this   triamcinolone ointment 0.5 % Commonly known as:  KENALOG Apply to lesions on hands twice  a day as needed      DISCHARGE INSTRUCTIONS:   DIET:  Regular diet DISCHARGE CONDITION:  Good ACTIVITY:  Activity as tolerated OXYGEN:  Home Oxygen: Yes.    Oxygen Delivery: 2-3 liters/min via Patient connected to nasal cannula oxygen DISCHARGE LOCATION:  nursing home   If you experience worsening of your admission symptoms, develop shortness of breath, life threatening emergency, suicidal or homicidal thoughts you must seek medical attention immediately by calling 911 or calling your MD immediately  if symptoms less severe.  You Must read complete instructions/literature along with all the possible adverse reactions/side effects for all the Medicines you take and that have been prescribed to you. Take any new Medicines after you have completely understood and accpet all the possible adverse reactions/side effects.   Please note  You were cared for by a hospitalist during your hospital stay. If you have any questions about your discharge medications or the care you received while you were in the hospital after you are discharged, you can call the unit and asked to speak with the hospitalist on call if the hospitalist that took care of you is  not available. Once you are discharged, your primary care physician will handle any further medical issues. Please note that NO REFILLS for any discharge medications will be authorized once you are discharged, as it is imperative that you return to your primary care physician (or establish a relationship with a primary care physician if you do not have one) for your aftercare needs so that they can reassess your need for medications and monitor your lab values.    On the day of Discharge:  VITAL SIGNS:  Blood pressure (!) 126/58, pulse 84, temperature 97.7 F (36.5 C), temperature source Oral, resp. rate 20, height 5\' 1"  (1.549 m), weight 42.4 kg (93 lb 6.4 oz), SpO2 94 %. PHYSICAL EXAMINATION:  GENERAL:  65 y.o.-year-old patient lying in the bed with no acute distress.  EYES: Pupils equal, round, reactive to light and accommodation. No scleral icterus. Extraocular muscles intact.  HEENT: Head atraumatic, normocephalic. Oropharynx and nasopharynx clear.  NECK:  Supple, no jugular venous distention. No thyroid enlargement, no tenderness.  LUNGS: Normal breath sounds bilaterally, no wheezing, rales,rhonchi or crepitation. No use of accessory muscles of respiration.  CARDIOVASCULAR: S1, S2 normal. No murmurs, rubs, or gallops.  ABDOMEN: Soft, non-tender, non-distended. Bowel sounds present. No organomegaly or mass.  EXTREMITIES: No pedal edema, cyanosis, or clubbing.  NEUROLOGIC: Cranial nerves II through XII are intact. Muscle strength 5/5 in all extremities. Sensation intact. Gait not checked.  PSYCHIATRIC: The patient is alert and oriented x 3.  SKIN: No obvious rash, lesion, or ulcer.  DATA REVIEW:   CBC  Recent Labs Lab 11/07/16 0444  WBC 9.0  HGB 9.9*  HCT 29.7*  PLT 227    Chemistries   Recent Labs Lab 11/05/16 1029  11/07/16 0444  NA 133*  < > 139  K 3.4*  < > 3.8  CL 92*  < > 103  CO2 28  < > 30  GLUCOSE 70  < > 132*  BUN 23*  < > 20  CREATININE 1.04*  < > 0.66    CALCIUM 8.2*  < > 8.2*  AST 21  --   --   ALT 10*  --   --   ALKPHOS 124  --   --   BILITOT 1.2  --   --   < > = values in this interval not  displayed.   Follow-up Information    Mila Merryonald Fisher, MD. Schedule an appointment as soon as possible for a visit in 1 week(s).   Specialty:  Family Medicine Contact information: [address] [place] [address] [phone]            Management plans discussed with the patient, family and they are in agreement.  CODE STATUS: FULL CODE   TOTAL TIME TAKING CARE OF THIS PATIENT: 45 minutes.    Delfino LovettVipul Keishia Ground M.D on 11/07/2016 at 8:26 AM  Between 7am to 6pm - Pager - [phone]  After 6pm go to [email or web address] - Social research officer, [email or web address] [email or web address]  [facility] [place] [facility]  Office  [phone]  CC: Primary care physician; Mila Merryonald Fisher, MD   Note: This dictation was prepared with Dragon dictation along with smaller phrase technology. Any transcriptional errors that result from this process are unintentional.

## 2016-11-07 NOTE — Consult Note (Signed)
   [facility] CM [facility]   11/07/2016  Cheryl MargaritaMary George [name] 11/29/1950 [id number]   Called into patient's room and patient gave permission for liaison to speak with her daughter Cheryl [name]. Cheryl [name] confirmed patient's discharge plan is to go [facility] before returning home. Cheryl [name] asked her mother while liaison on the phone if she gave permission for [facility] Care Management services to continue to follow and patient stated yes. [facility] with [facility] for chronic disease management services. Patient has been engaged by a [employer] Community Care Coordinator, [employer] Health Coach and CSW.  Our community based plan of care has focused on disease management and community resource support.  Patient will receive a post discharge visit at SNF by [facility] LCSW who will assist with discharge needs, and post SNF discharge will receive transition of care calls and will be evaluated for monthly home visits for assessments and disease process education.  Will make Inpatient Case Manager aware that [facility] Care Management is following. Of note, [facility] Care Management services does not replace or interfere with any services that are needed or arranged by inpatient case management or social work.  For additional questions or referrals please contact:  Treyten Monestime RN, BSN [facility]  Hospital Liaison  [phone]) Business Mobile [phone]) Toll free office

## 2016-11-10 LAB — CULTURE, BLOOD (ROUTINE X 2)
CULTURE: NO GROWTH
CULTURE: NO GROWTH

## 2016-11-11 ENCOUNTER — Telehealth: Payer: Self-pay | Admitting: Pain Medicine

## 2016-11-11 NOTE — Telephone Encounter (Signed)
Called Pts husband- He states that Mrs Cheryl [name] had pnue and was placed in nursing home for rehab. He states that the doctor taking care of her prescribed Fent patch 25, and that it is not working and is hurting. Dr Laban EmperorNaveira had ordered 50  And what should he do. Instructed Mr Greggory StallionGeorge that he needs to talk with the nurse taking care of her and she could call us and verifiy  What Dr Laban EmperorNaveira had prescribed. They could also send us a fax

## 2016-11-11 NOTE — Telephone Encounter (Signed)
Patient's husband would like to speak with Nurse, please call [phone]

## 2016-11-11 NOTE — Telephone Encounter (Signed)
Today I received a phone call from the hospital asking me if I wanted to increase Cheryl [name]'s Duragesic back to 50 g per hour. I informed them that I was not aware that it had been decreased. They inform the that the patient had recently been hospitalized secondary to shortness of breath and a pneumonia. Subsequently the patient was discharged on a Duragesic 25 g per hour patch. The color indicated that Cheryl [name] had said that he had spoken to me today and that I wanted for her to go back to the 50 g patch. Unfortunately, this is not accurate since I have not talked to him since the last time that he was here. In any case, I have no problem with Cheryl [name] being on the 50 g per hour patch, as long as it is okayed by the physician that decreased the dose since there is the possibility that there was a good reason for that decreased. If that is the case, then the patient should stay at that dose prescribed by the other physician until I have a chance to personally evaluate her. The color indicated that she would be calling the physician that was taking care of the patient while she was hospitalized. I indicated to her that this would be the best course of action.

## 2016-11-12 ENCOUNTER — Other Ambulatory Visit: Payer: Self-pay | Admitting: *Deleted

## 2016-11-12 ENCOUNTER — Encounter: Payer: Self-pay | Admitting: *Deleted

## 2016-11-12 DIAGNOSIS — E785 Hyperlipidemia, unspecified: Secondary | ICD-10-CM | POA: Diagnosis not present

## 2016-11-12 DIAGNOSIS — J441 Chronic obstructive pulmonary disease with (acute) exacerbation: Secondary | ICD-10-CM | POA: Diagnosis not present

## 2016-11-12 DIAGNOSIS — R4182 Altered mental status, unspecified: Secondary | ICD-10-CM | POA: Diagnosis not present

## 2016-11-12 DIAGNOSIS — J189 Pneumonia, unspecified organism: Secondary | ICD-10-CM | POA: Diagnosis not present

## 2016-11-12 DIAGNOSIS — G894 Chronic pain syndrome: Secondary | ICD-10-CM | POA: Diagnosis not present

## 2016-11-12 DIAGNOSIS — I2510 Atherosclerotic heart disease of native coronary artery without angina pectoris: Secondary | ICD-10-CM | POA: Diagnosis not present

## 2016-11-12 DIAGNOSIS — F0280 Dementia in other diseases classified elsewhere without behavioral disturbance: Secondary | ICD-10-CM | POA: Diagnosis not present

## 2016-11-12 DIAGNOSIS — F411 Generalized anxiety disorder: Secondary | ICD-10-CM | POA: Diagnosis not present

## 2016-11-12 NOTE — Patient Outreach (Signed)
[facility] Care Management  [facility] Social Work  11/12/2016  Cheryl MargaritaMary George Gatson 1951/10/30 [id number]  Subjective:  Patient is a [age] year old female currently in re[facility]. Per patient,  PT is  estimating 2 weeks of rehab before  discharge home. Patient states that she returned to hospital due to  Pneumonia.  Patient states that she is willing to participate fully in therapy to go home as soon as possible.  Patient understands that she will be in the SNF through Christmas as reports that she will be able to return home on a day pass on Saturday.  Objective:   Encounter Medications:  Outpatient Encounter Prescriptions as of 11/12/2016  Medication Sig  . acetaminophen (TYLENOL) 500 MG tablet Take 1,000 mg by mouth every 6 (six) hours as needed for mild pain or headache.   . ADVAIR DISKUS 250-50 MCG/DOSE AEPB inhale 1 dose by mouth twice a day  . albuterol (PROVENTIL HFA;VENTOLIN HFA) 108 (90 BASE) MCG/ACT inhaler Inhale 2 puffs into the lungs every 4 (four) hours as needed for wheezing or shortness of breath.  Marland Kitchen. albuterol (PROVENTIL) (2.5 MG/3ML) 0.083% nebulizer solution Take 3 mLs (2.5 mg total) by nebulization every 4 (four) hours as needed for wheezing.  Marland Kitchen. ALPRAZolam (XANAX) 0.25 MG tablet Take 1 tablet (0.25 mg total) by mouth 4 (four) times daily as needed for anxiety.  . celecoxib (CELEBREX) 100 MG capsule Take 1 capsule (100 mg total) by mouth 2 (two) times daily as needed.  . clopidogrel (PLAVIX) 75 MG tablet Take 75 mg by mouth daily.  . cyclobenzaprine (FLEXERIL) 5 MG tablet take 1-2 tablets by mouth every 8 hours if needed  . diphenoxylate-atropine (LOMOTIL) 2.5-0.025 MG per tablet Take 2 tablets by mouth 4 (four) times daily as needed for diarrhea or loose stools. Reported on 03/11/2016  . donepezil (ARICEPT) 10 MG tablet Take 10 mg by mouth at bedtime.  Marland Kitchen. estropipate (OGEN) 0.75 MG tablet Take 1 tablet (0.75 mg total) by mouth every other  day.  . fentaNYL (DURAGESIC) 25 MCG/HR patch Place 1 patch (25 mcg total) onto the skin every 3 (three) days.  . fluticasone (FLONASE) 50 MCG/ACT nasal spray Place 2 sprays into both nostrils daily.  . furosemide (LASIX) 40 MG tablet Take 40 mg by mouth daily.  Marland Kitchen. levofloxacin (LEVAQUIN) 500 MG tablet Take 1 tablet (500 mg total) by mouth daily.  . mupirocin ointment (BACTROBAN) 2 % Place 1 application into the nose 2 (two) times daily.  . pantoprazole (PROTONIX) 40 MG tablet take 1 tablet by mouth twice a day  . potassium chloride (K-DUR,KLOR-CON) 10 MEQ tablet Take 10 mEq by mouth daily.  . predniSONE (STERAPRED UNI-PAK 21 TAB) 10 MG (21) TBPK tablet Take 1 tablet (10 mg total) by mouth daily. Start 60 mg once daily, taper 10 mg daily until done  . pregabalin (LYRICA) 150 MG capsule Take 1 capsule (150 mg total) by mouth 3 (three) times daily.  . simvastatin (ZOCOR) 10 MG tablet Take 10 mg by mouth at bedtime. Reported on 12/04/2015  . SUMAtriptan (IMITREX) 25 MG tablet Take 25 mg by mouth as needed for migraine. May repeat in 2 hours if headache persists or recurs.  Marland Kitchen. tiotropium (SPIRIVA) 18 MCG inhalation capsule Place 1 capsule (18 mcg total) into inhaler and inhale daily.  . traMADol (ULTRAM) 50 MG tablet Take 2 tablets (100 mg total) by mouth every 6 (six) hours as needed for moderate pain or severe  pain.  . triamcinolone ointment (KENALOG) 0.5 % Apply to lesions on hands twice a day as needed   No facility-administered encounter medications on file as of 11/12/2016.     Functional Status:  In your present state of health, do you have any difficulty performing the following activities: 11/12/2016 11/05/2016  Hearing? N N  Vision? N N  Difficulty concentrating or making decisions? N N  Walking or climbing stairs? N N  Dressing or bathing? N N  Doing errands, shopping? N N  Preparing Food and eating ? Y -  Using the Toilet? N -  In the past six months, have you accidently leaked urine?  Y -  Do you have problems with loss of bowel control? Y -  Managing your Medications? Y -  Managing your Finances? Y -  Housekeeping or managing your Housekeeping? N -  Some recent data might be hidden    Fall/Depression Screening:  PHQ 2/9 Scores 11/12/2016 08/20/2016 07/08/2016 07/03/2016 06/17/2016 06/14/2016 05/06/2016  PHQ - 2 Score 0 0 0 0 0 0 0    Assessment:  Patient friendly and engaging during the visit today. She discussed concern that she has been hospitalized again with pneumonia. Patient just finished a smoke break with this Child psychotherapistsocial worker arrived.  Patient reports no interest in smoking cessation at this time.  Patient appears motivated to participate daily in therapy.    Plan: This Child psychotherapistsocial worker will continue to follow patient's progress while in the SNF and will collaborate with the discharge planner within 2 weeks regarding patient's discharge needs.     Adriana ReamsChrystal Land, LCSW [facility] Care [facility] [phone]

## 2016-11-13 ENCOUNTER — Encounter: Payer: Self-pay | Admitting: *Deleted

## 2016-11-14 ENCOUNTER — Inpatient Hospital Stay: Payer: Medicare HMO | Admitting: Family Medicine

## 2016-11-14 ENCOUNTER — Ambulatory Visit: Payer: Self-pay | Admitting: *Deleted

## 2016-11-16 ENCOUNTER — Other Ambulatory Visit: Payer: Self-pay | Admitting: Family Medicine

## 2016-11-22 DIAGNOSIS — E43 Unspecified severe protein-calorie malnutrition: Secondary | ICD-10-CM | POA: Diagnosis not present

## 2016-11-22 DIAGNOSIS — I2510 Atherosclerotic heart disease of native coronary artery without angina pectoris: Secondary | ICD-10-CM | POA: Diagnosis not present

## 2016-11-22 DIAGNOSIS — J440 Chronic obstructive pulmonary disease with acute lower respiratory infection: Secondary | ICD-10-CM | POA: Diagnosis not present

## 2016-11-22 DIAGNOSIS — J45909 Unspecified asthma, uncomplicated: Secondary | ICD-10-CM | POA: Diagnosis not present

## 2016-11-22 DIAGNOSIS — J181 Lobar pneumonia, unspecified organism: Secondary | ICD-10-CM | POA: Diagnosis not present

## 2016-11-22 DIAGNOSIS — I509 Heart failure, unspecified: Secondary | ICD-10-CM | POA: Diagnosis not present

## 2016-11-22 DIAGNOSIS — G629 Polyneuropathy, unspecified: Secondary | ICD-10-CM | POA: Diagnosis not present

## 2016-11-22 DIAGNOSIS — I739 Peripheral vascular disease, unspecified: Secondary | ICD-10-CM | POA: Diagnosis not present

## 2016-11-22 DIAGNOSIS — I110 Hypertensive heart disease with heart failure: Secondary | ICD-10-CM | POA: Diagnosis not present

## 2016-11-26 DIAGNOSIS — I509 Heart failure, unspecified: Secondary | ICD-10-CM | POA: Diagnosis not present

## 2016-11-26 DIAGNOSIS — I739 Peripheral vascular disease, unspecified: Secondary | ICD-10-CM | POA: Diagnosis not present

## 2016-11-26 DIAGNOSIS — J45909 Unspecified asthma, uncomplicated: Secondary | ICD-10-CM | POA: Diagnosis not present

## 2016-11-26 DIAGNOSIS — G629 Polyneuropathy, unspecified: Secondary | ICD-10-CM | POA: Diagnosis not present

## 2016-11-26 DIAGNOSIS — J181 Lobar pneumonia, unspecified organism: Secondary | ICD-10-CM | POA: Diagnosis not present

## 2016-11-26 DIAGNOSIS — I110 Hypertensive heart disease with heart failure: Secondary | ICD-10-CM | POA: Diagnosis not present

## 2016-11-26 DIAGNOSIS — E43 Unspecified severe protein-calorie malnutrition: Secondary | ICD-10-CM | POA: Diagnosis not present

## 2016-11-26 DIAGNOSIS — F17219 Nicotine dependence, cigarettes, with unspecified nicotine-induced disorders: Secondary | ICD-10-CM | POA: Diagnosis not present

## 2016-11-26 DIAGNOSIS — J449 Chronic obstructive pulmonary disease, unspecified: Secondary | ICD-10-CM | POA: Diagnosis not present

## 2016-11-28 ENCOUNTER — Ambulatory Visit: Payer: Commercial Managed Care - HMO | Admitting: Pain Medicine

## 2016-11-29 ENCOUNTER — Telehealth: Payer: Self-pay | Admitting: Family Medicine

## 2016-11-29 DIAGNOSIS — G629 Polyneuropathy, unspecified: Secondary | ICD-10-CM | POA: Diagnosis not present

## 2016-11-29 DIAGNOSIS — I110 Hypertensive heart disease with heart failure: Secondary | ICD-10-CM | POA: Diagnosis not present

## 2016-11-29 DIAGNOSIS — J181 Lobar pneumonia, unspecified organism: Secondary | ICD-10-CM | POA: Diagnosis not present

## 2016-11-29 DIAGNOSIS — I739 Peripheral vascular disease, unspecified: Secondary | ICD-10-CM | POA: Diagnosis not present

## 2016-11-29 DIAGNOSIS — J45909 Unspecified asthma, uncomplicated: Secondary | ICD-10-CM | POA: Diagnosis not present

## 2016-11-29 DIAGNOSIS — E43 Unspecified severe protein-calorie malnutrition: Secondary | ICD-10-CM | POA: Diagnosis not present

## 2016-11-29 DIAGNOSIS — J449 Chronic obstructive pulmonary disease, unspecified: Secondary | ICD-10-CM | POA: Diagnosis not present

## 2016-11-29 DIAGNOSIS — I509 Heart failure, unspecified: Secondary | ICD-10-CM | POA: Diagnosis not present

## 2016-11-29 DIAGNOSIS — F17219 Nicotine dependence, cigarettes, with unspecified nicotine-induced disorders: Secondary | ICD-10-CM | POA: Diagnosis not present

## 2016-11-29 NOTE — Telephone Encounter (Signed)
OK to add another week to PT

## 2016-11-29 NOTE — Telephone Encounter (Signed)
Verbal okay? Allene DillonEmily Drozdowski, CMA

## 2016-11-29 NOTE — Telephone Encounter (Signed)
[facility]bbott LaboratoriesCalled Katie and gave her verbal order. Allene DillonEmily Drozdowski, CMA

## 2016-11-29 NOTE — Telephone Encounter (Signed)
Cheryl [name] is a physical therapist with [facility] and stated pt missed a home physical therapy appt this week and would like verbal permission to add a week extending the orders to 12/13/16. Please advise. Thanks TNP

## 2016-12-02 ENCOUNTER — Telehealth: Payer: Self-pay | Admitting: Family Medicine

## 2016-12-02 ENCOUNTER — Encounter: Payer: Commercial Managed Care - HMO | Admitting: Pain Medicine

## 2016-12-02 ENCOUNTER — Other Ambulatory Visit: Payer: Self-pay | Admitting: *Deleted

## 2016-12-02 ENCOUNTER — Encounter: Payer: Self-pay | Admitting: *Deleted

## 2016-12-02 DIAGNOSIS — J181 Lobar pneumonia, unspecified organism: Secondary | ICD-10-CM | POA: Diagnosis not present

## 2016-12-02 DIAGNOSIS — I509 Heart failure, unspecified: Secondary | ICD-10-CM | POA: Diagnosis not present

## 2016-12-02 DIAGNOSIS — G629 Polyneuropathy, unspecified: Secondary | ICD-10-CM | POA: Diagnosis not present

## 2016-12-02 DIAGNOSIS — F17219 Nicotine dependence, cigarettes, with unspecified nicotine-induced disorders: Secondary | ICD-10-CM | POA: Diagnosis not present

## 2016-12-02 DIAGNOSIS — J45909 Unspecified asthma, uncomplicated: Secondary | ICD-10-CM | POA: Diagnosis not present

## 2016-12-02 DIAGNOSIS — J449 Chronic obstructive pulmonary disease, unspecified: Secondary | ICD-10-CM | POA: Diagnosis not present

## 2016-12-02 DIAGNOSIS — I110 Hypertensive heart disease with heart failure: Secondary | ICD-10-CM | POA: Diagnosis not present

## 2016-12-02 DIAGNOSIS — E43 Unspecified severe protein-calorie malnutrition: Secondary | ICD-10-CM | POA: Diagnosis not present

## 2016-12-02 DIAGNOSIS — I739 Peripheral vascular disease, unspecified: Secondary | ICD-10-CM | POA: Diagnosis not present

## 2016-12-02 NOTE — Telephone Encounter (Signed)
Katie with [facility] called to let Dr. Sherrie MustacheFisher know that she fell Saturday in her bathroom.  There is no apparent injury.  This is an   FYI from her nurse.  Thanks Barth Kirkseri

## 2016-12-02 NOTE — Telephone Encounter (Signed)
Please review. Cheryl [name], CMA  

## 2016-12-02 NOTE — Patient Outreach (Addendum)
[facility]) Care Management  12/02/2016  Cheryl MargaritaMary George [name] October 13, 1951 [id number]   Phone call to patient to follow up on status of her rehab stay [facility].  Per patient, she has discharged home on 11/19/16 with home health services through [facility].  This was confirmed by Waynetta SandyBeth at the facility as well. Patient verbalized having no additional social work needs at this time.  Patient to be closed to social work.  [facility] RNCM to be notified of patient's discharge for transition of care.   Adriana ReamsChrystal Yareth Kearse, LCSW [facility] Care Management [phone]

## 2016-12-04 ENCOUNTER — Encounter: Payer: Self-pay | Admitting: *Deleted

## 2016-12-04 ENCOUNTER — Other Ambulatory Visit: Payer: Self-pay | Admitting: *Deleted

## 2016-12-04 NOTE — Patient Outreach (Signed)
[facility]) Care Management  12/04/2016  Cheryl [name] [birth year] [id number]  Transition of care call [facility], and discharged home on 12/26.  Spoke with patient states she is feeling better not 100% yet. Patient denies increase in shortness of breath or cough continues with productive cough about the same with light yellow sputum. Patient denies having to use rescue inhaler this week, she wears oxygen at 2 liters as needed, not requiring it currently.   Patient states she is staying by herself this week, but she talks frequently with her husband and sister on the phone. [facility] reports [facility] and PT have visits scheduled this week. Patient was recently discharged from hospital and all medications have been reviewed. Patient husband prepares patient medication using a pill organizer.  Patient discussed recent fall she had in bathroom, when she stood up in shower, discussed bruising on her bottom no other injury.  Patient discussed her husband was nearby and assisted her up.  Reminded patient of making follow up visit with PCP, states she will call. Patient denies any new concerns at this time.  Plan Will follow patient weekly for transition of care outreaches, next telephonic outreach in a week then schedule home visit. Patient will make follow up PCP visit. Fall preventions measures reviewed.    [facility] CM [facility] Problem One   Flowsheet Row Most Recent Value  Care Plan Problem One  High risk for readmission related to hospital admission for pneumonia   Role Documenting the Problem One  Care Management Coordinator  Care Plan for Problem One  Active  [facility] Long Term Goal (31-90 days)  Patient will not experience a hospital readmission in the next 60 days   [facility] Long Term Goal Start Date  12/04/16  Interventions for Problem One Long Term Goal  Explained  transition of care  program, provided contact information, reviewed importance of notifying MD sooner for worsening of symptoms and taking medications as prescribed.   [facility] CM Short Term Goal #1 (0-30 days)  Patient will make appointment with PCP and attend visit in the next 14 days   [facility] CM Short Term Goal #1 Start Date  12/04/16  Interventions for Short Term Goal #1  Reviewed importance of timely PCP follow after discharge.   [facility] CM Short Term Goal #2 (0-30 days)  Patient will report not falls in the next 30 days   [facility] CM Short Term Goal #2 Start Date  12/04/16  Interventions for Short Term Goal #2  Reviewed importance of having stand by assistance when showering. reinforced importance of continuing to use rolling walker at all times      Egbert GaribaldiKimberly Glover, RN, [facility] Care Management [phone] Mobile [phone]- Toll Free Main Office

## 2016-12-05 ENCOUNTER — Encounter: Payer: Self-pay | Admitting: Pain Medicine

## 2016-12-05 ENCOUNTER — Ambulatory Visit: Payer: Commercial Managed Care - HMO | Attending: Pain Medicine | Admitting: Pain Medicine

## 2016-12-05 VITALS — BP 127/58 | HR 72 | Temp 98.5°F | Resp 16 | Ht 61.0 in | Wt 96.0 lb

## 2016-12-05 DIAGNOSIS — M488X6 Other specified spondylopathies, lumbar region: Secondary | ICD-10-CM | POA: Insufficient documentation

## 2016-12-05 DIAGNOSIS — E559 Vitamin D deficiency, unspecified: Secondary | ICD-10-CM | POA: Diagnosis not present

## 2016-12-05 DIAGNOSIS — I509 Heart failure, unspecified: Secondary | ICD-10-CM | POA: Diagnosis not present

## 2016-12-05 DIAGNOSIS — M810 Age-related osteoporosis without current pathological fracture: Secondary | ICD-10-CM | POA: Insufficient documentation

## 2016-12-05 DIAGNOSIS — M1288 Other specific arthropathies, not elsewhere classified, other specified site: Secondary | ICD-10-CM | POA: Diagnosis not present

## 2016-12-05 DIAGNOSIS — Z833 Family history of diabetes mellitus: Secondary | ICD-10-CM | POA: Insufficient documentation

## 2016-12-05 DIAGNOSIS — J449 Chronic obstructive pulmonary disease, unspecified: Secondary | ICD-10-CM | POA: Insufficient documentation

## 2016-12-05 DIAGNOSIS — M47816 Spondylosis without myelopathy or radiculopathy, lumbar region: Secondary | ICD-10-CM

## 2016-12-05 DIAGNOSIS — F1190 Opioid use, unspecified, uncomplicated: Secondary | ICD-10-CM

## 2016-12-05 DIAGNOSIS — M4726 Other spondylosis with radiculopathy, lumbar region: Secondary | ICD-10-CM | POA: Insufficient documentation

## 2016-12-05 DIAGNOSIS — F17210 Nicotine dependence, cigarettes, uncomplicated: Secondary | ICD-10-CM | POA: Diagnosis not present

## 2016-12-05 DIAGNOSIS — Z882 Allergy status to sulfonamides status: Secondary | ICD-10-CM | POA: Insufficient documentation

## 2016-12-05 DIAGNOSIS — Z8261 Family history of arthritis: Secondary | ICD-10-CM | POA: Diagnosis not present

## 2016-12-05 DIAGNOSIS — Z7902 Long term (current) use of antithrombotics/antiplatelets: Secondary | ICD-10-CM | POA: Diagnosis not present

## 2016-12-05 DIAGNOSIS — J181 Lobar pneumonia, unspecified organism: Secondary | ICD-10-CM | POA: Diagnosis not present

## 2016-12-05 DIAGNOSIS — Z79891 Long term (current) use of opiate analgesic: Secondary | ICD-10-CM | POA: Insufficient documentation

## 2016-12-05 DIAGNOSIS — Z885 Allergy status to narcotic agent status: Secondary | ICD-10-CM | POA: Insufficient documentation

## 2016-12-05 DIAGNOSIS — E7800 Pure hypercholesterolemia, unspecified: Secondary | ICD-10-CM | POA: Diagnosis not present

## 2016-12-05 DIAGNOSIS — M545 Low back pain: Secondary | ICD-10-CM | POA: Diagnosis present

## 2016-12-05 DIAGNOSIS — M1990 Unspecified osteoarthritis, unspecified site: Secondary | ICD-10-CM | POA: Diagnosis not present

## 2016-12-05 DIAGNOSIS — E43 Unspecified severe protein-calorie malnutrition: Secondary | ICD-10-CM | POA: Diagnosis not present

## 2016-12-05 DIAGNOSIS — I2510 Atherosclerotic heart disease of native coronary artery without angina pectoris: Secondary | ICD-10-CM | POA: Insufficient documentation

## 2016-12-05 DIAGNOSIS — M797 Fibromyalgia: Secondary | ICD-10-CM | POA: Diagnosis not present

## 2016-12-05 DIAGNOSIS — I110 Hypertensive heart disease with heart failure: Secondary | ICD-10-CM | POA: Diagnosis not present

## 2016-12-05 DIAGNOSIS — Z86718 Personal history of other venous thrombosis and embolism: Secondary | ICD-10-CM | POA: Insufficient documentation

## 2016-12-05 DIAGNOSIS — R634 Abnormal weight loss: Secondary | ICD-10-CM | POA: Insufficient documentation

## 2016-12-05 DIAGNOSIS — K219 Gastro-esophageal reflux disease without esophagitis: Secondary | ICD-10-CM | POA: Diagnosis not present

## 2016-12-05 DIAGNOSIS — G894 Chronic pain syndrome: Secondary | ICD-10-CM | POA: Insufficient documentation

## 2016-12-05 DIAGNOSIS — F419 Anxiety disorder, unspecified: Secondary | ICD-10-CM | POA: Insufficient documentation

## 2016-12-05 DIAGNOSIS — Z8249 Family history of ischemic heart disease and other diseases of the circulatory system: Secondary | ICD-10-CM | POA: Insufficient documentation

## 2016-12-05 DIAGNOSIS — Z79899 Other long term (current) drug therapy: Secondary | ICD-10-CM | POA: Insufficient documentation

## 2016-12-05 DIAGNOSIS — Z681 Body mass index (BMI) 19 or less, adult: Secondary | ICD-10-CM | POA: Insufficient documentation

## 2016-12-05 DIAGNOSIS — Z9181 History of falling: Secondary | ICD-10-CM | POA: Insufficient documentation

## 2016-12-05 DIAGNOSIS — I739 Peripheral vascular disease, unspecified: Secondary | ICD-10-CM | POA: Insufficient documentation

## 2016-12-05 DIAGNOSIS — T45515A Adverse effect of anticoagulants, initial encounter: Secondary | ICD-10-CM

## 2016-12-05 DIAGNOSIS — M5416 Radiculopathy, lumbar region: Secondary | ICD-10-CM

## 2016-12-05 DIAGNOSIS — J45909 Unspecified asthma, uncomplicated: Secondary | ICD-10-CM | POA: Diagnosis not present

## 2016-12-05 DIAGNOSIS — F17219 Nicotine dependence, cigarettes, with unspecified nicotine-induced disorders: Secondary | ICD-10-CM | POA: Diagnosis not present

## 2016-12-05 DIAGNOSIS — D6959 Other secondary thrombocytopenia: Secondary | ICD-10-CM

## 2016-12-05 DIAGNOSIS — G629 Polyneuropathy, unspecified: Secondary | ICD-10-CM | POA: Diagnosis not present

## 2016-12-05 DIAGNOSIS — Z7951 Long term (current) use of inhaled steroids: Secondary | ICD-10-CM | POA: Insufficient documentation

## 2016-12-05 MED ORDER — FENTANYL 50 MCG/HR TD PT72: 50.0000 ug | MEDICATED_PATCH | TRANSDERMAL | 0 refills | Status: DC

## 2016-12-05 MED ORDER — PREGABALIN 150 MG PO CAPS: 150.0000 mg | ORAL_CAPSULE | Freq: Three times a day (TID) | ORAL | 2 refills | Status: DC

## 2016-12-05 MED ORDER — TRAMADOL HCL 50 MG PO TABS: 100.0000 mg | ORAL_TABLET | Freq: Four times a day (QID) | ORAL | 2 refills | Status: DC | PRN

## 2016-12-05 NOTE — Progress Notes (Signed)
Patient was hospitalized for pneumonia in 10/2016 and then went [facility] for [facility].

## 2016-12-05 NOTE — Progress Notes (Signed)
Patient's Name: Cheryl [name]  MRN: [id number]  Referring Provider: Birdie Sons, MD  DOB: [birth year]  PCP: Birdie Sons, MD  DOS: 12/05/2016  Note by: Kathlen Brunswick. Dossie Arbour, MD  Service setting: Ambulatory outpatient  Specialty: Interventional Pain Management  Location: [facility] (AMB) Pain Management Facility    Patient type: Established   Primary Reason(s) for Visit: Encounter for prescription drug management (Level of risk: moderate) CC: Back Pain (lower)  HPI  Ms. Mcmeans is a 66 y.o. year old, female patient, who comes today for a medication management evaluation. She has Essential hypertension; GERD (gastroesophageal reflux disease); Hyperlipemia; Anxiety; Airway hyperreactivity; Back pain, thoracic; Coronary artery disease; Excessive falling; Alteration in bowel elimination: incontinence; Insomnia; Decreased testosterone level; Leg weakness; Menopausal symptom; Migraine; Neuropathy ([place]); Fecal occult blood test positive; OP (osteoporosis); Panic disorder; Compulsive tobacco user syndrome; Urinary incontinence; Vitamin D deficiency; Weight loss; COPD (chronic obstructive pulmonary disease) ([place]); Dementia; Lumbar radicular pain (B) (L>R) (L4); Chronic bilateral low back pain without sciatica; Encounter for therapeutic drug level monitoring; Uncomplicated opioid dependence ([employer]); Chronic pain syndrome; Hypercholesteremia; Peripheral nerve disease ([place]); Peripheral vascular disease ([place]); Platelet inhibition due to Plavix; Abnormal mammogram of right breast; Dilated intrahepatic bile duct; Dysphagia; Acute diastolic CHF (congestive heart failure) ([place]); Long term current use of opiate analgesic; Fibromyalgia; Osteoarthrosis; Long term current use of anticoagulant therapy (Plavix); Opiate use (160 MME/Day); Anemia; Lumbar facet syndrome (Location of Primary Source of Pain) (Bilateral) (L>R); Lumbar spondylosis; Pneumonia; Nausea with vomiting; CAP (community acquired pneumonia); and Protein-calorie  malnutrition, severe on her problem list. Her primarily concern today is the Back Pain (lower)  Pain Assessment: Self-Reported Pain Score: 3 /10             Reported level is compatible with observation.       Pain Type: Chronic pain Pain Location: Back Pain Orientation: Lower, Left, Right Pain Descriptors / Indicators: Throbbing, Constant Pain Frequency: Constant  Ms. Lattner was last seen on 11/11/2016 for medication management. During today's appointment we reviewed Ms. Paolucci's chronic pain status, as well as her outpatient medication regimen. Having some more back pain. She would like to come in for some shots. Review of previous treatments would suggest excellent diagnostic results with >50% benefit for lumbar facet blocks. Because of age, fragility, and her other medical problems, she is not a candidate for any further physical therapy attempts.  The patient  reports that she does not use drugs. Her body mass index is 18.14 kg/m.  Further details on both, my assessment(s), as well as the proposed treatment plan, please see below.  Controlled Substance Pharmacotherapy Assessment REMS (Risk Evaluation and Mitigation Strategy)  Analgesic:Duragesic 50 mcg/h every 72 hours + Tramadol 100 mg PO q6hrs (400 mg/day) MME/day:160 mg/day.  [name], RN  12/05/2016  1:59 PM  Sign at close encounter Patient was hospitalized for pneumonia in 10/2016 and then went to [facility] for rehabilitation.   [name], RN  12/05/2016  1:57 PM  Sign at close encounter Nursing Pain Medication Assessment:  Safety precautions to be maintained throughout the outpatient stay will include: orient to surroundings, keep bed in low position, maintain call bell within reach at all times, provide assistance with transfer out of bed and ambulation.  Medication Inspection Compliance: Pill count conducted under aseptic conditions, in front of the patient. Neither the pills nor the bottle was removed from  the patient's sight at any time. Once count was completed pills were immediately returned to the patient in their  original bottle.  Medication #1: Tramadol (Ultram) Pill Count: 97 of 240 pills remain Bottle Appearance: Standard pharmacy container. Clearly labeled. Filled Date: 50 / 24 / 2017 Medication last intake: 12/05/16 midday  Medication #2: Fentanyl patch Pill Count: 5 of 10 pills remain Bottle Appearance: Standard pharmacy container. Clearly labeled. Filled Date: 96 / 10 / 2017 Medication last intake: 12/04/16 hs   Pharmacokinetics: Liberation and absorption (onset of action): WNL Distribution (time to peak effect): WNL Metabolism and excretion (duration of action): WNL         Pharmacodynamics: Desired effects: Analgesia: Ms. Cudney reports >50% benefit. Functional ability: Patient reports that medication allows her to accomplish basic ADLs Clinically meaningful improvement in function (CMIF): Sustained CMIF goals met Perceived effectiveness: Described as relatively effective, allowing for increase in activities of daily living (ADL) Undesirable effects: Side-effects or Adverse reactions: None reported Monitoring: [place] PMP: Online review of the past 76-monthperiod conducted. Compliant with practice rules and regulations List of all UDS test(s) done:  Lab Results  Component Value Date   TOXASSSELUR FINAL 03/20/2016   TOXASSSELUR FINAL 02/28/2016   [place] 12/04/2015   Last UDS on record: ToxAssure Select 13  Date Value Ref Range Status  03/20/2016 FINAL  Final    Comment:    ==================================================================== TOXASSURE SELECT 13 (MW) ==================================================================== Test                             Result       Flag       Units Drug Present and Declared for Prescription Verification   Fentanyl                       57           EXPECTED   ng/mg creat   Norfentanyl                    >485          EXPECTED   ng/mg creat    Source of fentanyl is a scheduled prescription medication,    including IV, patch, and transmucosal formulations. Norfentanyl    is an expected metabolite of fentanyl.   Tramadol                       PRESENT      EXPECTED   O-Desmethyltramadol            PRESENT      EXPECTED   N-Desmethyltramadol            PRESENT      EXPECTED    Source of tramadol is a prescription medication.    O-desmethyltramadol and N-desmethyltramadol are expected    metabolites of tramadol. ==================================================================== Test                      Result    Flag   Units      Ref Range   Creatinine              206              mg/dL      >=20 ==================================================================== Declared Medications:  The flagging and interpretation on this report are based on the  following declared medications.  Unexpected results may arise from  inaccuracies in the declared medications.  **Note: The testing scope of this panel includes these  medications:  Fentanyl (Duragesic)  Tramadol (Ultram)  **Note: The testing scope of this panel does not include following  reported medications:  Atropine (Lomotil)  Diphenoxylate (Lomotil)  Donepezil (Aricept)  Estropipate (Ogen)  Fluticasone (Advair)  Furosemide (Lasix)  Pantoprazole (Protonix)  Pregabalin (Lyrica)  Salmeterol (Advair)  Simvastatin (Zocor)  Sumatriptan (Imitrex)  Tiotropium (Spiriva) ==================================================================== For clinical consultation, please call [phone]. ====================================================================    UDS interpretation: Compliant          Medication Assessment Form: Reviewed. Patient indicates being compliant with therapy Treatment compliance: Compliant Risk Assessment Profile: Aberrant behavior: See prior evaluations. None observed or detected today Comorbid factors  increasing risk of overdose: See prior notes. No additional risks detected today Risk of substance use disorder (SUD): Low Opioid Risk Tool (ORT) Total Score: 0  Interpretation Table:  Score <3 = Low Risk for SUD  Score between 4-7 = Moderate Risk for SUD  Score >8 = High Risk for Opioid Abuse   Risk Mitigation Strategies:  Patient Counseling: Covered Patient-Prescriber Agreement (PPA): Present and active  Notification to other healthcare providers: Done  Pharmacologic Plan: No change in therapy, at this time  Laboratory Chemistry  Inflammation Markers Lab Results  Component Value Date   ESRSEDRATE 52 (H) 03/01/2016   CRP 0.6 03/01/2016   Renal Function Lab Results  Component Value Date   BUN 20 11/07/2016   CREATININE 0.66 11/07/2016   GFRAA >60 11/07/2016   GFRNONAA >60 11/07/2016   Hepatic Function Lab Results  Component Value Date   AST 21 11/05/2016   ALT 10 (L) 11/05/2016   ALBUMIN 3.7 11/05/2016   Electrolytes Lab Results  Component Value Date   NA 139 11/07/2016   K 3.8 11/07/2016   CL 103 11/07/2016   CALCIUM 8.2 (L) 11/07/2016   MG 1.9 06/04/2016   Pain Modulating Vitamins Lab Results  Component Value Date   VD25OH 25.6 (L) 01/17/2016   VITAMINB12 242 03/01/2016   Coagulation Parameters Lab Results  Component Value Date   INR 0.97 11/05/2016   LABPROT 12.9 11/05/2016   APTT 38 (H) 10/17/2015   PLT 227 11/07/2016   Cardiovascular Lab Results  Component Value Date   BNP 55.0 11/05/2016   HGB 9.9 (L) 11/07/2016   HCT 29.7 (L) 11/07/2016   Note: Lab results reviewed.  Recent Diagnostic Imaging Review  Ct Head Wo Contrast  Result Date: 11/05/2016 CLINICAL DATA:  Lethargy/ altered mental status.  Fall 1 week prior EXAM: CT HEAD WITHOUT CONTRAST TECHNIQUE: Contiguous axial images were obtained from the base of the skull through the vertex without intravenous contrast. COMPARISON:  October 09, 2015 FINDINGS: Brain: The ventricles are normal  in size and configuration. There is no intracranial mass, hemorrhage, extra-axial fluid collection, or midline shift. There is patchy small vessel disease in the centra semiovale bilaterally. There is no new gray-white compartment lesion. No acute infarct evident. Vascular: There is no hyperdense vessel. There are foci of calcification in each carotid siphon region. Skull: The bony calvarium appears intact. There is a high right parietal scalp hematoma. Sinuses/Orbits: There is extensive opacification of multiple ethmoid air cells bilaterally. There is mucosal thickening in the sphenoid sinus regions bilaterally. Mucosal thickening is also noted in each maxillary antrum. Frontal sinuses are nearly aplastic. There is opacification in portions of the left maxillary antrum which are opacified. Orbits appear symmetric bilaterally. Other: Mastoid air cells are clear. IMPRESSION: Patchy periventricular small vessel disease. No acute infarct evident. No intracranial mass, hemorrhage, or extra-axial  fluid collection. Extensive paranasal sinus disease. Prior right parietal scalp hematoma. Scattered foci of arterial vascular calcification. Electronically Signed   By: Lowella Grip III M.D.   On: 11/05/2016 12:40   Dg Chest Portable 1 View  Result Date: 11/05/2016 CLINICAL DATA:  Shortness of breath, lethargy, hypoxia and crackles. EXAM: PORTABLE CHEST 1 VIEW COMPARISON:  06/02/2016 FINDINGS: The heart size and mediastinal contours are within normal limits. Irregular airspace opacity in the left lower lung may represent acute pneumonia. No edema or pleural fluid identified. The visualized skeletal structures are unremarkable. IMPRESSION: Potential pneumonia in the left lower lung. Electronically Signed   By: Aletta Edouard M.D.   On: 11/05/2016 11:22   Note: Imaging results reviewed.          Meds  The patient has a current medication list which includes the following prescription(s): acetaminophen, advair  diskus, albuterol, albuterol, alprazolam, celecoxib, clopidogrel, cyclobenzaprine, diphenoxylate-atropine, donepezil, estropipate, fentanyl, fentanyl, fentanyl, fluticasone, furosemide, mupirocin ointment, pantoprazole, potassium chloride, pregabalin, simvastatin, spiriva handihaler, sumatriptan, tramadol, and triamcinolone ointment.  Current Outpatient Prescriptions on File Prior to Visit  Medication Sig  . acetaminophen (TYLENOL) 500 MG tablet Take 1,000 mg by mouth every 6 (six) hours as needed for mild pain or headache.   . ADVAIR DISKUS 250-50 MCG/DOSE AEPB inhale 1 dose by mouth twice a day  . albuterol (PROVENTIL HFA;VENTOLIN HFA) 108 (90 BASE) MCG/ACT inhaler Inhale 2 puffs into the lungs every 4 (four) hours as needed for wheezing or shortness of breath.  Marland Kitchen albuterol (PROVENTIL) (2.5 MG/3ML) 0.083% nebulizer solution Take 3 mLs (2.5 mg total) by nebulization every 4 (four) hours as needed for wheezing.  Marland Kitchen ALPRAZolam (XANAX) 0.25 MG tablet Take 1 tablet (0.25 mg total) by mouth 4 (four) times daily as needed for anxiety.  . celecoxib (CELEBREX) 100 MG capsule Take 1 capsule (100 mg total) by mouth 2 (two) times daily as needed.  . clopidogrel (PLAVIX) 75 MG tablet Take 75 mg by mouth daily.  . cyclobenzaprine (FLEXERIL) 5 MG tablet take 1-2 tablets by mouth every 8 hours if needed  . diphenoxylate-atropine (LOMOTIL) 2.5-0.025 MG per tablet Take 2 tablets by mouth 4 (four) times daily as needed for diarrhea or loose stools. Reported on 03/11/2016  . donepezil (ARICEPT) 10 MG tablet Take 10 mg by mouth at bedtime.  Marland Kitchen estropipate (OGEN) 0.75 MG tablet Take 1 tablet (0.75 mg total) by mouth every other day.  . fluticasone (FLONASE) 50 MCG/ACT nasal spray Place 2 sprays into both nostrils daily.  . furosemide (LASIX) 40 MG tablet Take 40 mg by mouth daily.  . mupirocin ointment (BACTROBAN) 2 % Place 1 application into the nose 2 (two) times daily.  . pantoprazole (PROTONIX) 40 MG tablet take 1  tablet by mouth twice a day  . potassium chloride (K-DUR,KLOR-CON) 10 MEQ tablet Take 10 mEq by mouth daily.  . simvastatin (ZOCOR) 10 MG tablet Take 10 mg by mouth at bedtime. Reported on 12/04/2015  . SPIRIVA HANDIHALER 18 MCG inhalation capsule inhale the contents of one capsule in the handihaler once daily  . SUMAtriptan (IMITREX) 25 MG tablet Take 25 mg by mouth as needed for migraine. May repeat in 2 hours if headache persists or recurs.  . triamcinolone ointment (KENALOG) 0.5 % Apply to lesions on hands twice a day as needed   No current facility-administered medications on file prior to visit.    ROS  Constitutional: Denies any fever or chills Gastrointestinal: No reported hemesis, hematochezia, vomiting, or  acute GI distress Musculoskeletal: Denies any acute onset joint swelling, redness, loss of ROM, or weakness Neurological: No reported episodes of acute onset apraxia, aphasia, dysarthria, agnosia, amnesia, paralysis, loss of coordination, or loss of consciousness  Allergies  Ms. Madole is allergic to percocet [oxycodone-acetaminophen]; aspirin; codeine; propoxyphene; and sulfa antibiotics.  [place]  Drug: Ms. Ferriss  reports that she does not use drugs. Alcohol:  reports that she does not drink alcohol. Tobacco:  reports that she has been smoking Cigarettes.  She has a 25.00 pack-year smoking history. She has never used smokeless tobacco. Medical:  has a past medical history of Allergy; Anxiety; Arthritis; Aspiration pneumonitis ([place]) (11/24/2015); Asthma; CHF (congestive heart failure) ([place]); Chronic pain; COPD (chronic obstructive pulmonary disease) ([place]); Coronary artery disease; DVT (deep venous thrombosis) ([place]); GERD (gastroesophageal reflux disease); Headache; Hyperlipidemia; Hypertension; Migraines; Neuropathy ([place]) (2010); Osteoporosis; Oxygen deficiency; Peripheral vascular disease ([place]); Pneumonia; Pneumonia (11/19/2015); Pneumonia (10/2016); and Vitamin D deficiency. Family:  family history includes Arthritis in her mother; Cancer in her brother, brother, and mother; Diabetes in her brother, mother, and sister; Heart disease in her father and mother.  Past Surgical History:  Procedure Laterality Date  . ABDOMINAL HYSTERECTOMY  1975   Bilaterl Oophorectomy; [place] to IUD infection  . abdomnal aortic stent  05/30/2008   Dr. Quay Burow  . APPENDECTOMY    . APPENDECTOMY    . cardiac catherization  10/31/2009  . CERVICAL FUSION  C5 - 6/C6-7  . CHOLECYSTECTOMY  1972  . COLONOSCOPY WITH PROPOFOL N/A 07/27/2015   Procedure: COLONOSCOPY WITH PROPOFOL;  Surgeon: Hulen Luster, MD;  Location[facility] ENDOSCOPY;  Service: Gastroenterology;  Laterality: N/A;  . ESOPHAGOGASTRODUODENOSCOPY (EGD) WITH PROPOFOL N/A 07/27/2015   Procedure: ESOPHAGOGASTRODUODENOSCOPY (EGD) WITH PROPOFOL;  Surgeon: Hulen Luster, MD;  Location: [facility] ENDOSCOPY;  Service: Gastroenterology;  Laterality: N/A;  . FOOT SURGERY Bilateral    5-6 years per patient  . SPINE SURGERY     Constitutional Exam  General appearance: Well nourished, well developed, and well hydrated. In no apparent acute distress Vitals:   12/05/16 1344  BP: (!) 127/58  Pulse: 72  Resp: 16  Temp: 98.5 F (36.9 C)  TempSrc: Oral  SpO2: 97%  Weight: 96 lb (43.5 kg)  Height: '5\' 1"'$  (1.549 m)   BMI Assessment: Estimated body mass index is 18.14 kg/m as calculated from the following:   Height as of this encounter: '5\' 1"'$  (1.549 m).   Weight as of this encounter: 96 lb (43.5 kg).  BMI interpretation table: BMI level Category Range association with higher incidence of chronic pain  <18 kg/m2 Underweight   18.5-24.9 kg/m2 Ideal body weight   25-29.9 kg/m2 Overweight Increased incidence by 20%  30-34.9 kg/m2 Obese (Class I) Increased incidence by 68%  35-39.9 kg/m2 Severe obesity (Class II) Increased incidence by 136%  >40 kg/m2 Extreme obesity (Class III) Increased incidence by 254%   BMI Readings from Last 4 Encounters:  12/05/16  18.14 kg/m  11/05/16 17.65 kg/m  11/05/16 16.06 kg/m  10/24/16 16.82 kg/m   Wt Readings from Last 4 Encounters:  12/05/16 96 lb (43.5 kg)  11/05/16 93 lb 6.4 oz (42.4 kg)  11/05/16 85 lb (38.6 kg)  10/24/16 89 lb (40.4 kg)  Psych/Mental status: Alert, oriented x 3 (person, place, & time) Eyes: PERLA Respiratory: No evidence of acute respiratory distress  Cervical Spine Exam  Inspection: No masses, redness, or swelling Alignment: Symmetrical Functional ROM: Unrestricted ROM Stability: No instability detected Muscle strength & Tone:  Functionally intact Sensory: Unimpaired Palpation: Non-contributory  Upper Extremity (UE) Exam    Side: Right upper extremity  Side: Left upper extremity  Inspection: No masses, redness, swelling, or asymmetry  Inspection: No masses, redness, swelling, or asymmetry  Functional ROM: Unrestricted ROM          Functional ROM: Unrestricted ROM          Muscle strength & Tone: Functionally intact  Muscle strength & Tone: Functionally intact  Sensory: Unimpaired  Sensory: Unimpaired  Palpation: Non-contributory  Palpation: Non-contributory   Thoracic Spine Exam  Inspection: Significant thoracic kyphosis Alignment: Symmetrical Functional ROM: Unrestricted ROM Stability: No instability detected Sensory: Movement-associated discomfort Muscle strength & Tone: Functionally intact Palpation: Tender  Lumbar Spine Exam  Inspection: No masses, redness, or swelling Alignment: Symmetrical Functional ROM: Unrestricted ROM Stability: No instability detected Muscle strength & Tone: Functionally intact Sensory: Unimpaired Palpation: Non-contributory Provocative Tests: Lumbar Hyperextension and rotation test: evaluation deferred today       Patrick's Maneuver: evaluation deferred today              Gait & Posture Assessment  Ambulation: Limited Gait: Limited. Using assistive device to ambulate Posture: Antalgic   Lower Extremity Exam    Side: Right  lower extremity  Side: Left lower extremity  Inspection: No masses, redness, swelling, or asymmetry  Inspection: No masses, redness, swelling, or asymmetry  Functional ROM: Unrestricted ROM          Functional ROM: Unrestricted ROM          Muscle strength & Tone: Functionally intact  Muscle strength & Tone: Functionally intact  Sensory: Unimpaired  Sensory: Unimpaired  Palpation: Non-contributory  Palpation: Non-contributory   Assessment  Primary Diagnosis & Pertinent Problem List: The primary encounter diagnosis was Chronic pain syndrome. Diagnoses of Lumbar facet syndrome (Location of Primary Source of Pain) (Bilateral), Lumbar radicular pain, Lumbar spondylosis, Long term current use of opiate analgesic, Opiate use (160 MME/Day), Platelet inhibition due to Plavix, and Fibromyalgia were also pertinent to this visit.  Status Diagnosis  Stable Progressing Stable 1. Chronic pain syndrome   2. Lumbar facet syndrome (Location of Primary Source of Pain) (Bilateral)   3. Lumbar radicular pain   4. Lumbar spondylosis   5. Long term current use of opiate analgesic   6. Opiate use (160 MME/Day)   7. Platelet inhibition due to Plavix   8. Fibromyalgia      Plan of Care  Pharmacotherapy (Medications Ordered): Meds ordered this encounter  Medications  . traMADol (ULTRAM) 50 MG tablet    Sig: Take 2 tablets (100 mg total) by mouth every 6 (six) hours as needed for moderate pain or severe pain.    Dispense:  240 tablet    Refill:  2    Patient may have prescription filled one day early if pharmacy is closed on scheduled refill date. Do not fill until: 12/19/16 To last until: 03/19/17  . pregabalin (LYRICA) 150 MG capsule    Sig: Take 1 capsule (150 mg total) by mouth 3 (three) times daily.    Dispense:  90 capsule    Refill:  2    Do not add to the electronic "Automatic Refill" notification system. Patient may have prescription filled one day early if pharmacy is closed on scheduled refill  date.  . fentaNYL (DURAGESIC - DOSED MCG/HR) 50 MCG/HR    Sig: Place 1 patch (50 mcg total) onto the skin every 3 (three) days.    Dispense:  10  patch    Refill:  0    Patient may have prescription filled one day early if pharmacy is closed on scheduled refill date. Do not fill until: 12/19/16 To last until: 01/18/17  . fentaNYL (DURAGESIC - DOSED MCG/HR) 50 MCG/HR    Sig: Place 1 patch (50 mcg total) onto the skin every 3 (three) days.    Dispense:  10 patch    Refill:  0    Patient may have prescription filled one day early if pharmacy is closed on scheduled refill date. Do not fill until: 01/18/17 To last until: 02/17/17  . fentaNYL (DURAGESIC - DOSED MCG/HR) 50 MCG/HR    Sig: Place 1 patch (50 mcg total) onto the skin every 3 (three) days.    Dispense:  10 patch    Refill:  0    Patient may have prescription filled one day early if pharmacy is closed on scheduled refill date. Do not fill until: 02/17/17 To last until: 03/19/17   New Prescriptions   FENTANYL (DURAGESIC - DOSED MCG/HR) 50 MCG/HR    Place 1 patch (50 mcg total) onto the skin every 3 (three) days.   FENTANYL (DURAGESIC - DOSED MCG/HR) 50 MCG/HR    Place 1 patch (50 mcg total) onto the skin every 3 (three) days.   FENTANYL (DURAGESIC - DOSED MCG/HR) 50 MCG/HR    Place 1 patch (50 mcg total) onto the skin every 3 (three) days.   Medications administered today: Ms. Peitz had no medications administered during this visit. Lab-work, procedure(s), and/or referral(s): Orders Placed This Encounter  Procedures  . Radiofrequency,Lumbar  . ToxASSURE Select 13 (MW), Urine   Imaging and/or referral(s): None  Interventional therapies: Planned, scheduled, and/or pending:   Bilateral lumbar facet radiofrequency ablation under fluoroscopic guidance and IV sedation starting with the left side.    Considering:   Bilateral lumbar facet radiofrequency ablation    Palliative PRN treatment(s):   Palliative bilateral lumbar facet  block under fluoroscopic guidance and IV sedation   Provider-requested follow-up: Return in about 3 months (around 03/05/2017) for (MD) Med-Mgmt, in addition, procedure (ASAA).  Future Appointments Date Time Provider [place]  12/10/2016 2:30 PM Birdie Sons, MD [facility] None  12/11/2016 9:00 AM Alfonzo Feller, RN [facility] None  12/16/2016 2:20 PM [facility] DEXA 1 [facility]  03/06/2017 1:30 PM Milinda Pointer, MD [facility] None   Primary Care Physician: Birdie Sons, MD Location: [facility] Outpatient Pain Management Facility Note by: Kathlen Brunswick. Dossie Arbour, M.D, DABA, DABAPM, DABPM, DABIPP, FIPP Date: 12/05/16; Time: 3:02 PM  Pain Score Disclaimer: We use the NRS-11 scale. This is a self-reported, subjective measurement of pain severity with only modest accuracy. It is used primarily to identify changes within a particular patient. It must be understood that outpatient pain scales are significantly less accurate that those used for research, where they can be applied under ideal controlled circumstances with minimal exposure to variables. In reality, the score is likely to be a combination of pain intensity and pain affect, where pain affect describes the degree of emotional arousal or changes in action readiness caused by the sensory experience of pain. Factors such as social and work situation, setting, emotional state, anxiety levels, expectation, and prior pain experience may influence pain perception and show large inter-individual differences that may also be affected by time variables.  Patient instructions provided during this appointment: Patient Instructions   Facet Blocks Patient Information  Description: The facets are joints in the spine between the  vertebrae.  Like any joints in the body, facets can become irritated and painful.  Arthritis can also effect the facets.  By injecting steroids and local anesthetic in and around these joints, we can temporarily block the  nerve supply to them.  Steroids act directly on irritated nerves and tissues to reduce selling and inflammation which often leads to decreased pain.  Facet blocks may be done anywhere along the spine from the neck to the low back depending upon the location of your pain.   After numbing the skin with local anesthetic (like Novocaine), a small needle is passed onto the facet joints under x-ray guidance.  You may experience a sensation of pressure while this is being done.  The entire block usually lasts about 15-25 minutes.   Conditions which may be treated by facet blocks:   Low back/buttock pain  Neck/shoulder pain  Certain types of headaches  Preparation for the injection:  1. Do not eat any solid food or dairy products within 8 hours of your appointment. 2. You may drink clear liquid up to 3 hours before appointment.  Clear liquids include water, black coffee, juice or soda.  No milk or cream please. 3. You may take your regular medication, including pain medications, with a sip of water before your appointment.  Diabetics should hold regular insulin (if taken separately) and take 1/2 normal NPH dose the morning of the procedure.  Carry some sugar containing items with you to your appointment. 4. A driver must accompany you and be prepared to drive you home after your procedure. 5. Bring all your current medications with you. 6. An IV may be inserted and sedation may be given at the discretion of the physician. 7. A blood pressure cuff, EKG and other monitors will often be applied during the procedure.  Some patients may need to have extra oxygen administered for a short period. 8. You will be asked to provide medical information, including your allergies and medications, prior to the procedure.  We must know immediately if you are taking blood thinners (like Coumadin/Warfarin) or if you are allergic to IV iodine contrast (dye).  We must know if you could possible be pregnant.  Possible  side-effects:   Bleeding from needle site  Infection (rare, may require surgery)  Nerve injury (rare)  Numbness & tingling (temporary)  Difficulty urinating (rare, temporary)  Spinal headache (a headache worse with upright posture)  Light-headedness (temporary)  Pain at injection site (serveral days)  Decreased blood pressure (rare, temporary)  Weakness in arm/leg (temporary)  Pressure sensation in back/neck (temporary)   Call if you experience:   Fever/chills associated with headache or increased back/neck pain  Headache worsened by an upright position  New onset, weakness or numbness of an extremity below the injection site  Hives or difficulty breathing (go to the emergency room)  Inflammation or drainage at the injection site(s)  Severe back/neck pain greater than usual  New symptoms which are concerning to you  Please note:  Although the local anesthetic injected can often make your back or neck feel good for several hours after the injection, the pain will likely return. It takes 3-7 days for steroids to work.  You may not notice any pain relief for at least one week.  If effective, we will often do a series of 2-3 injections spaced 3-6 weeks apart to maximally decrease your pain.  After the initial series, you may be a candidate for a more permanent nerve block of the  facets.  If you have any questions, please call [phone] [place]  What are the risk, side effects and possible complications? Generally speaking, most procedures are safe.  However, with any procedure there are risks, side effects, and the possibility of complications.  The risks and complications are dependent upon the sites that are lesioned, or the type of nerve block to be performed.  The closer the procedure is to the spine, the more serious the risks are.  Great care is taken when placing the radio frequency  needles, block needles or lesioning probes, but sometimes complications can occur. 1. Infection: Any time there is an injection through the skin, there is a risk of infection.  This is why sterile conditions are used for these blocks.  There are four possible types of infection. 1. Localized skin infection. 2. Central Nervous System Infection-This can be in the form of Meningitis, which can be deadly. 3. Epidural Infections-This can be in the form of an epidural abscess, which can cause pressure inside of the spine, causing compression of the spinal cord with subsequent paralysis. This would require an emergency surgery to decompress, and there are no guarantees that the patient would recover from the paralysis. 4. Discitis-This is an infection of the intervertebral discs.  It occurs in about 1% of discography procedures.  It is difficult to treat and it may lead to surgery.        2. Pain: the needles have to go through skin and soft tissues, will cause soreness.       3. Damage to internal structures:  The nerves to be lesioned may be near blood vessels or    other nerves which can be potentially damaged.       4. Bleeding: Bleeding is more common if the patient is taking blood thinners such as  aspirin, Coumadin, Ticiid, Plavix, etc., or if he/she have some genetic predisposition  such as hemophilia. Bleeding into the spinal canal can cause compression of the spinal  cord with subsequent paralysis.  This would require an emergency surgery to  decompress and there are no guarantees that the patient would recover from the  paralysis.       5. Pneumothorax:  Puncturing of a lung is a possibility, every time a needle is introduced in  the area of the chest or upper back.  Pneumothorax refers to free air around the  collapsed lung(s), inside of the thoracic cavity (chest cavity).  Another two possible  complications related to a similar event would include: Hemothorax and Chylothorax.   These are variations  of the Pneumothorax, where instead of air around the collapsed  lung(s), you may have blood or chyle, respectively.       6. Spinal headaches: They may occur with any procedures in the area of the spine.       7. Persistent CSF (Cerebro-Spinal Fluid) leakage: This is a rare problem, but may occur  with prolonged intrathecal or epidural catheters either due to the formation of a fistulous  track or a dural tear.       8. Nerve damage: By working so close to the spinal cord, there is always a possibility of  nerve damage, which could be as serious as a permanent spinal cord injury with  paralysis.       9. Death:  Although rare, severe deadly allergic reactions known as "Anaphylactic  reaction" can occur to any of the medications used.  10. Worsening of the symptoms:  We can always make thing worse.  What are the chances of something like this happening? Chances of any of this occuring are extremely low.  By statistics, you have more of a chance of getting killed in a motor vehicle accident: while driving to the hospital than any of the above occurring .  Nevertheless, you should be aware that they are possibilities.  In general, it is similar to taking a shower.  Everybody knows that you can slip, hit your head and get killed.  Does that mean that you should not shower again?  Nevertheless always keep in mind that statistics do not mean anything if you happen to be on the wrong side of them.  Even if a procedure has a 1 (one) in a 1,000,000 (million) chance of going wrong, it you happen to be that one..Also, keep in mind that by statistics, you have more of a chance of having something go wrong when taking medications.  Who should not have this procedure? If you are on a blood thinning medication (e.g. Coumadin, Plavix, see list of "Blood Thinners"), or if you have an active infection going on, you should not have the procedure.  If you are taking any blood thinners, please inform your  physician.  How should I prepare for this procedure?  Do not eat or drink anything at least six hours prior to the procedure.  Bring a driver with you .  It cannot be a taxi.  Come accompanied by an adult that can drive you back, and that is strong enough to help you if your legs get weak or numb from the local anesthetic.  Take all of your medicines the morning of the procedure with just enough water to swallow them.  If you have diabetes, make sure that you are scheduled to have your procedure done first thing in the morning, whenever possible.  If you have diabetes, take only half of your insulin dose and notify our nurse that you have done so as soon as you arrive at the clinic.  If you are diabetic, but only take blood sugar pills (oral hypoglycemic), then do not take them on the morning of your procedure.  You may take them after you have had the procedure.  Do not take aspirin or any aspirin-containing medications, at least eleven (11) days prior to the procedure.  They may prolong bleeding.  Wear loose fitting clothing that may be easy to take off and that you would not mind if it got stained with Betadine or blood.  Do not wear any jewelry or perfume  Remove any nail coloring.  It will interfere with some of our monitoring equipment.  NOTE: Remember that this is not meant to be interpreted as a complete list of all possible complications.  Unforeseen problems may occur.  BLOOD THINNERS The following drugs contain aspirin or other products, which can cause increased bleeding during surgery and should not be taken for 2 weeks prior to and 1 week after surgery.  If you should need take something for relief of minor pain, you may take acetaminophen which is found in Tylenol,m Datril, Anacin-3 and Panadol. It is not blood thinner. The products listed below are.  Do not take any of the products listed below in addition to any listed on your instruction sheet.  A.P.C or A.P.C with  Codeine Codeine Phosphate Capsules #3 Ibuprofen Ridaura  ABC compound Congesprin Imuran rimadil  Advil Cope Indocin Robaxisal  Alka-Seltzer Effervescent Pain Reliever and Antacid Coricidin or Coricidin-D  Indomethacin Rufen  Alka-Seltzer plus Cold Medicine Cosprin Ketoprofen S-A-C Tablets  Anacin Analgesic Tablets or Capsules Coumadin Korlgesic Salflex  Anacin Extra Strength Analgesic tablets or capsules CP-2 Tablets Lanoril Salicylate  Anaprox Cuprimine Capsules Levenox Salocol  Anexsia-D Dalteparin Magan Salsalate  Anodynos Darvon compound Magnesium Salicylate Sine-off  Ansaid Dasin Capsules Magsal Sodium Salicylate  Anturane Depen Capsules Marnal Soma  APF Arthritis pain formula Dewitt's Pills Measurin Stanback  Argesic Dia-Gesic Meclofenamic Sulfinpyrazone  Arthritis Bayer Timed Release Aspirin Diclofenac Meclomen Sulindac  Arthritis pain formula Anacin Dicumarol Medipren Supac  Analgesic (Safety coated) Arthralgen Diffunasal Mefanamic Suprofen  Arthritis Strength Bufferin Dihydrocodeine Mepro Compound Suprol  Arthropan liquid Dopirydamole Methcarbomol with Aspirin Synalgos  ASA tablets/Enseals Disalcid Micrainin Tagament  Ascriptin Doan's Midol Talwin  Ascriptin A/D Dolene Mobidin Tanderil  Ascriptin Extra Strength Dolobid Moblgesic Ticlid  Ascriptin with Codeine Doloprin or Doloprin with Codeine Momentum Tolectin  Asperbuf Duoprin Mono-gesic Trendar  Aspergum Duradyne Motrin or Motrin IB Triminicin  Aspirin plain, buffered or enteric coated Durasal Myochrisine Trigesic  Aspirin Suppositories Easprin Nalfon Trillsate  Aspirin with Codeine Ecotrin Regular or Extra Strength Naprosyn Uracel  Atromid-S Efficin Naproxen Ursinus  Auranofin Capsules Elmiron Neocylate Vanquish  Axotal Emagrin Norgesic Verin  Azathioprine Empirin or Empirin with Codeine Normiflo Vitamin E  Azolid Emprazil Nuprin Voltaren  Bayer Aspirin plain, buffered or children's or timed BC Tablets or powders  Encaprin Orgaran Warfarin Sodium  Buff-a-Comp Enoxaparin Orudis Zorpin  Buff-a-Comp with Codeine Equegesic Os-Cal-Gesic   Buffaprin Excedrin plain, buffered or Extra Strength Oxalid   Bufferin Arthritis Strength Feldene Oxphenbutazone   Bufferin plain or Extra Strength Feldene Capsules Oxycodone with Aspirin   Bufferin with Codeine Fenoprofen Fenoprofen Pabalate or Pabalate-SF   Buffets II Flogesic Panagesic   Buffinol plain or Extra Strength Florinal or Florinal with Codeine Panwarfarin   Buf-Tabs Flurbiprofen Penicillamine   Butalbital Compound Four-way cold tablets Penicillin   Butazolidin Fragmin Pepto-Bismol   Carbenicillin Geminisyn Percodan   Carna Arthritis Reliever Geopen Persantine   Carprofen Gold's salt Persistin   Chloramphenicol Goody's Phenylbutazone   Chloromycetin Haltrain Piroxlcam   Clmetidine heparin Plaquenil   Cllnoril Hyco-pap Ponstel   Clofibrate Hydroxy chloroquine Propoxyphen         Before stopping any of these medications, be sure to consult the physician who ordered them.  Some, such as Coumadin (Warfarin) are ordered to prevent or treat serious conditions such as "deep thrombosis", "pumonary embolisms", and other heart problems.  The amount of time that you may need off of the medication may also vary with the medication and the reason for which you were taking it.  If you are taking any of these medications, please make sure you notify your pain physician before you undergo any procedures.         Radiofrequency Lesioning Introduction Radiofrequency lesioning is a procedure that is performed to relieve pain. The procedure is often used for back, neck, or arm pain. Radiofrequency lesioning involves the use of a machine that creates radio waves to make heat. During the procedure, the heat is applied to the nerve that carries the pain signal. The heat damages the nerve and interferes with the pain signal. Pain relief usually starts about 2 weeks after the  procedure and lasts for 6 months to 1 year. Tell a health care provider about:  Any allergies you have.  All medicines you are taking, including vitamins, herbs, eye drops, creams, and over-the-counter medicines.  Any problems you or family members have had with anesthetic medicines.  Any blood disorders you have.  Any surgeries you have had.  Any medical conditions you have.  Whether you are pregnant or may be pregnant. What are the risks? Generally, this is a safe procedure. However, problems may occur, including:  Pain or soreness at the injection site.  Infection at the injection site.  Damage to nerves or blood vessels. What happens before the procedure?  Ask your health care provider about:  Changing or stopping your regular medicines. This is especially important if you are taking diabetes medicines or blood thinners.  Taking medicines such as aspirin and ibuprofen. These medicines can thin your blood. Do not take these medicines before your procedure if your health care provider instructs you not to.  Follow instructions from your health care provider about eating or drinking restrictions.  Plan to have someone take you home after the procedure.  If you go home right after the procedure, plan to have someone with you for 24 hours. What happens during the procedure?  You will be given one or more of the following:  A medicine to help you relax (sedative).  A medicine to numb the area (local anesthetic).  You will be awake during the procedure. You will need to be able to talk with the health care provider during the procedure.  With the help of a type of X-ray (fluoroscopy), the health care provider will insert a radiofrequency needle into the area to be treated.  Next, a wire that carries the radio waves (electrode) will be put through the radiofrequency needle. An electrical pulse will be sent through the electrode to verify the correct nerve. You will feel a  tingling sensation, and you may have muscle twitching.  Then, the tissue that is around the needle tip will be heated by an electric current that is passed using the radiofrequency machine. This will numb the nerves.  A bandage (dressing) will be put on the insertion area after the procedure is done. The procedure may vary among health care providers and hospitals. What happens after the procedure?  Your blood pressure, heart rate, breathing rate, and blood oxygen level will be monitored often until the medicines you were given have worn off.  Return to your normal activities as directed by your health care provider. This information is not intended to replace advice given to you by your health care provider. Make sure you discuss any questions you have with your health care provider. Document Released: 07/10/2011 Document Revised: 04/18/2016 Document Reviewed: 12/19/2014  2017 [employer]

## 2016-12-05 NOTE — Patient Instructions (Addendum)
Facet Blocks Patient Information  Description: The facets are joints in the spine between the vertebrae.  Like any joints in the body, facets can become irritated and painful.  Arthritis can also effect the facets.  By injecting steroids and local anesthetic in and around these joints, we can temporarily block the nerve supply to them.  Steroids act directly on irritated nerves and tissues to reduce selling and inflammation which often leads to decreased pain.  Facet blocks may be done anywhere along the spine from the neck to the low back depending upon the location of your pain.   After numbing the skin with local anesthetic (like Novocaine), a small needle is passed onto the facet joints under x-ray guidance.  You may experience a sensation of pressure while this is being done.  The entire block usually lasts about 15-25 minutes.   Conditions which may be treated by facet blocks:   Low back/buttock pain  Neck/shoulder pain  Certain types of headaches  Preparation for the injection:  1. Do not eat any solid food or dairy products within 8 hours of your appointment. 2. You may drink clear liquid up to 3 hours before appointment.  Clear liquids include water, black coffee, juice or soda.  No milk or cream please. 3. You may take your regular medication, including pain medications, with a sip of water before your appointment.  Diabetics should hold regular insulin (if taken separately) and take 1/2 normal NPH dose the morning of the procedure.  Carry some sugar containing items with you to your appointment. 4. A driver must accompany you and be prepared to drive you home after your procedure. 5. Bring all your current medications with you. 6. An IV may be inserted and sedation may be given at the discretion of the physician. 7. A blood pressure cuff, EKG and other monitors will often be applied during the procedure.  Some patients may need to have extra oxygen administered for a short  period. 8. You will be asked to provide medical information, including your allergies and medications, prior to the procedure.  We must know immediately if you are taking blood thinners (like Coumadin/Warfarin) or if you are allergic to IV iodine contrast (dye).  We must know if you could possible be pregnant.  Possible side-effects:   Bleeding from needle site  Infection (rare, may require surgery)  Nerve injury (rare)  Numbness & tingling (temporary)  Difficulty urinating (rare, temporary)  Spinal headache (a headache worse with upright posture)  Light-headedness (temporary)  Pain at injection site (serveral days)  Decreased blood pressure (rare, temporary)  Weakness in arm/leg (temporary)  Pressure sensation in back/neck (temporary)   Call if you experience:   Fever/chills associated with headache or increased back/neck pain  Headache worsened by an upright position  New onset, weakness or numbness of an extremity below the injection site  Hives or difficulty breathing (go to the emergency room)  Inflammation or drainage at the injection site(s)  Severe back/neck pain greater than usual  New symptoms which are concerning to you  Please note:  Although the local anesthetic injected can often make your back or neck feel good for several hours after the injection, the pain will likely return. It takes 3-7 days for steroids to work.  You may not notice any pain relief for at least one week.  If effective, we will often do a series of 2-3 injections spaced 3-6 weeks apart to maximally decrease your pain.  After the initial   series, you may be a candidate for a more permanent nerve block of the facets.  If you have any questions, please call [phone] [facility]S  What are the risk, side effects and possible complications? Generally speaking, most procedures are safe.  However, with any procedure  there are risks, side effects, and the possibility of complications.  The risks and complications are dependent upon the sites that are lesioned, or the type of nerve block to be performed.  The closer the procedure is to the spine, the more serious the risks are.  Great care is taken when placing the radio frequency needles, block needles or lesioning probes, but sometimes complications can occur. 1. Infection: Any time there is an injection through the skin, there is a risk of infection.  This is why sterile conditions are used for these blocks.  There are four possible types of infection. 1. Localized skin infection. 2. Central Nervous System Infection-This can be in the form of Meningitis, which can be deadly. 3. Epidural Infections-This can be in the form of an epidural abscess, which can cause pressure inside of the spine, causing compression of the spinal cord with subsequent paralysis. This would require an emergency surgery to decompress, and there are no guarantees that the patient would recover from the paralysis. 4. Discitis-This is an infection of the intervertebral discs.  It occurs in about 1% of discography procedures.  It is difficult to treat and it may lead to surgery.        2. Pain: the needles have to go through skin and soft tissues, will cause soreness.       3. Damage to internal structures:  The nerves to be lesioned may be near blood vessels or    other nerves which can be potentially damaged.       4. Bleeding: Bleeding is more common if the patient is taking blood thinners such as  aspirin, Coumadin, Ticiid, Plavix, etc., or if he/she have some genetic predisposition  such as hemophilia. Bleeding into the spinal canal can cause compression of the spinal  cord with subsequent paralysis.  This would require an emergency surgery to  decompress and there are no guarantees that the patient would recover from the  paralysis.       5. Pneumothorax:  Puncturing of a lung is a  possibility, every time a needle is introduced in  the area of the chest or upper back.  Pneumothorax refers to free air around the  collapsed lung(s), inside of the thoracic cavity (chest cavity).  Another two possible  complications related to a similar event would include: Hemothorax and Chylothorax.   These are variations of the Pneumothorax, where instead of air around the collapsed  lung(s), you may have blood or chyle, respectively.       6. Spinal headaches: They may occur with any procedures in the area of the spine.       7. Persistent CSF (Cerebro-Spinal Fluid) leakage: This is a rare problem, but may occur  with prolonged intrathecal or epidural catheters either due to the formation of a fistulous  track or a dural tear.       8. Nerve damage: By working so close to the spinal cord, there is always a possibility of  nerve damage, which could be as serious as a permanent spinal cord injury with  paralysis.       9. Death:  Although rare, severe deadly allergic   reactions known as "Anaphylactic  reaction" can occur to any of the medications used.      10. Worsening of the symptoms:  We can always make thing worse.  What are the chances of something like this happening? Chances of any of this occuring are extremely low.  By statistics, you have more of a chance of getting killed in a motor vehicle accident: while driving to the hospital than any of the above occurring .  Nevertheless, you should be aware that they are possibilities.  In general, it is similar to taking a shower.  Everybody knows that you can slip, hit your head and get killed.  Does that mean that you should not shower again?  Nevertheless always keep in mind that statistics do not mean anything if you happen to be on the wrong side of them.  Even if a procedure has a 1 (one) in a 1,000,000 (million) chance of going wrong, it you happen to be that one..Also, keep in mind that by statistics, you have more of a chance of having  something go wrong when taking medications.  Who should not have this procedure? If you are on a blood thinning medication (e.g. Coumadin, Plavix, see list of "Blood Thinners"), or if you have an active infection going on, you should not have the procedure.  If you are taking any blood thinners, please inform your physician.  How should I prepare for this procedure?  Do not eat or drink anything at least six hours prior to the procedure.  Bring a driver with you .  It cannot be a taxi.  Come accompanied by an adult that can drive you back, and that is strong enough to help you if your legs get weak or numb from the local anesthetic.  Take all of your medicines the morning of the procedure with just enough water to swallow them.  If you have diabetes, make sure that you are scheduled to have your procedure done first thing in the morning, whenever possible.  If you have diabetes, take only half of your insulin dose and notify our nurse that you have done so as soon as you arrive at the clinic.  If you are diabetic, but only take blood sugar pills (oral hypoglycemic), then do not take them on the morning of your procedure.  You may take them after you have had the procedure.  Do not take aspirin or any aspirin-containing medications, at least eleven (11) days prior to the procedure.  They may prolong bleeding.  Wear loose fitting clothing that may be easy to take off and that you would not mind if it got stained with Betadine or blood.  Do not wear any jewelry or perfume  Remove any nail coloring.  It will interfere with some of our monitoring equipment.  NOTE: Remember that this is not meant to be interpreted as a complete list of all possible complications.  Unforeseen problems may occur.  BLOOD THINNERS The following drugs contain aspirin or other products, which can cause increased bleeding during surgery and should not be taken for 2 weeks prior to and 1 week after surgery.  If you  should need take something for relief of minor pain, you may take acetaminophen which is found in Tylenol,m Datril, Anacin-3 and Panadol. It is not blood thinner. The products listed below are.  Do not take any of the products listed below in addition to any listed on your instruction sheet.  A.P.C or A.P.C with   Codeine Codeine Phosphate Capsules #3 Ibuprofen Ridaura  ABC compound Congesprin Imuran rimadil  Advil Cope Indocin Robaxisal  Alka-Seltzer Effervescent Pain Reliever and Antacid Coricidin or Coricidin-D  Indomethacin Rufen  Alka-Seltzer plus Cold Medicine Cosprin Ketoprofen S-A-C Tablets  Anacin Analgesic Tablets or Capsules Coumadin Korlgesic Salflex  Anacin Extra Strength Analgesic tablets or capsules CP-2 Tablets Lanoril Salicylate  Anaprox Cuprimine Capsules Levenox Salocol  Anexsia-D Dalteparin Magan Salsalate  Anodynos Darvon compound Magnesium Salicylate Sine-off  Ansaid Dasin Capsules Magsal Sodium Salicylate  Anturane Depen Capsules Marnal Soma  APF Arthritis pain formula Dewitt's Pills Measurin Stanback  Argesic Dia-Gesic Meclofenamic Sulfinpyrazone  Arthritis Bayer Timed Release Aspirin Diclofenac Meclomen Sulindac  Arthritis pain formula Anacin Dicumarol Medipren Supac  Analgesic (Safety coated) Arthralgen Diffunasal Mefanamic Suprofen  Arthritis Strength Bufferin Dihydrocodeine Mepro Compound Suprol  Arthropan liquid Dopirydamole Methcarbomol with Aspirin Synalgos  ASA tablets/Enseals Disalcid Micrainin Tagament  Ascriptin Doan's Midol Talwin  Ascriptin A/D Dolene Mobidin Tanderil  Ascriptin Extra Strength Dolobid Moblgesic Ticlid  Ascriptin with Codeine Doloprin or Doloprin with Codeine Momentum Tolectin  Asperbuf Duoprin Mono-gesic Trendar  Aspergum Duradyne Motrin or Motrin IB Triminicin  Aspirin plain, buffered or enteric coated Durasal Myochrisine Trigesic  Aspirin Suppositories Easprin Nalfon Trillsate  Aspirin with Codeine Ecotrin Regular or Extra Strength  Naprosyn Uracel  Atromid-S Efficin Naproxen Ursinus  Auranofin Capsules Elmiron Neocylate Vanquish  Axotal Emagrin Norgesic Verin  Azathioprine Empirin or Empirin with Codeine Normiflo Vitamin E  Azolid Emprazil Nuprin Voltaren  Bayer Aspirin plain, buffered or children's or timed BC Tablets or powders Encaprin Orgaran Warfarin Sodium  Buff-a-Comp Enoxaparin Orudis Zorpin  Buff-a-Comp with Codeine Equegesic Os-Cal-Gesic   Buffaprin Excedrin plain, buffered or Extra Strength Oxalid   Bufferin Arthritis Strength Feldene Oxphenbutazone   Bufferin plain or Extra Strength Feldene Capsules Oxycodone with Aspirin   Bufferin with Codeine Fenoprofen Fenoprofen Pabalate or Pabalate-SF   Buffets II Flogesic Panagesic   Buffinol plain or Extra Strength Florinal or Florinal with Codeine Panwarfarin   Buf-Tabs Flurbiprofen Penicillamine   Butalbital Compound Four-way cold tablets Penicillin   Butazolidin Fragmin Pepto-Bismol   Carbenicillin Geminisyn Percodan   Carna Arthritis Reliever Geopen Persantine   Carprofen Gold's salt Persistin   Chloramphenicol Goody's Phenylbutazone   Chloromycetin Haltrain Piroxlcam   Clmetidine heparin Plaquenil   Cllnoril Hyco-pap Ponstel   Clofibrate Hydroxy chloroquine Propoxyphen         Before stopping any of these medications, be sure to consult the physician who ordered them.  Some, such as Coumadin (Warfarin) are ordered to prevent or treat serious conditions such as "deep thrombosis", "pumonary embolisms", and other heart problems.  The amount of time that you may need off of the medication may also vary with the medication and the reason for which you were taking it.  If you are taking any of these medications, please make sure you notify your pain physician before you undergo any procedures.         Radiofrequency Lesioning Introduction Radiofrequency lesioning is a procedure that is performed to relieve pain. The procedure is often used for back,  neck, or arm pain. Radiofrequency lesioning involves the use of a machine that creates radio waves to make heat. During the procedure, the heat is applied to the nerve that carries the pain signal. The heat damages the nerve and interferes with the pain signal. Pain relief usually starts about 2 weeks after the procedure and lasts for 6 months to 1 year. Tell a health care provider about:  Any  allergies you have.  All medicines you are taking, including vitamins, herbs, eye drops, creams, and over-the-counter medicines.  Any problems you or family members have had with anesthetic medicines.  Any blood disorders you have.  Any surgeries you have had.  Any medical conditions you have.  Whether you are pregnant or may be pregnant. What are the risks? Generally, this is a safe procedure. However, problems may occur, including:  Pain or soreness at the injection site.  Infection at the injection site.  Damage to nerves or blood vessels. What happens before the procedure?  Ask your health care provider about:  Changing or stopping your regular medicines. This is especially important if you are taking diabetes medicines or blood thinners.  Taking medicines such as aspirin and ibuprofen. These medicines can thin your blood. Do not take these medicines before your procedure if your health care provider instructs you not to.  Follow instructions from your health care provider about eating or drinking restrictions.  Plan to have someone take you home after the procedure.  If you go home right after the procedure, plan to have someone with you for 24 hours. What happens during the procedure?  You will be given one or more of the following:  A medicine to help you relax (sedative).  A medicine to numb the area (local anesthetic).  You will be awake during the procedure. You will need to be able to talk with the health care provider during the procedure.  With the help of a type of  X-ray (fluoroscopy), the health care provider will insert a radiofrequency needle into the area to be treated.  Next, a wire that carries the radio waves (electrode) will be put through the radiofrequency needle. An electrical pulse will be sent through the electrode to verify the correct nerve. You will feel a tingling sensation, and you may have muscle twitching.  Then, the tissue that is around the needle tip will be heated by an electric current that is passed using the radiofrequency machine. This will numb the nerves.  A bandage (dressing) will be put on the insertion area after the procedure is done. The procedure may vary among health care providers and hospitals. What happens after the procedure?  Your blood pressure, heart rate, breathing rate, and blood oxygen level will be monitored often until the medicines you were given have worn off.  Return to your normal activities as directed by your health care provider. This information is not intended to replace advice given to you by your health care provider. Make sure you discuss any questions you have with your health care provider. Document Released: 07/10/2011 Document Revised: 04/18/2016 Document Reviewed: 12/19/2014  2017 [employer]

## 2016-12-05 NOTE — Progress Notes (Signed)
Nursing Pain Medication Assessment:  Safety precautions to be maintained throughout the outpatient stay will include: orient to surroundings, keep bed in low position, maintain call bell within reach at all times, provide assistance with transfer out of bed and ambulation.  Medication Inspection Compliance: Pill count conducted under aseptic conditions, in front of the patient. Neither the pills nor the bottle was removed from the patient's sight at any time. Once count was completed pills were immediately returned to the patient in their original bottle.  Medication #1: Tramadol (Ultram) Pill Count: 97 of 240 pills remain Bottle Appearance: Standard pharmacy container. Clearly labeled. Filled Date: 2812 / 24 / 2017 Medication last intake: 12/05/16 midday  Medication #2: Fentanyl patch Pill Count: 5 of 10 pills remain Bottle Appearance: Standard pharmacy container. Clearly labeled. Filled Date: 3812 / 10 / 2017 Medication last intake: 12/04/16 hs

## 2016-12-06 DIAGNOSIS — J45909 Unspecified asthma, uncomplicated: Secondary | ICD-10-CM | POA: Diagnosis not present

## 2016-12-06 DIAGNOSIS — F17219 Nicotine dependence, cigarettes, with unspecified nicotine-induced disorders: Secondary | ICD-10-CM

## 2016-12-06 DIAGNOSIS — I739 Peripheral vascular disease, unspecified: Secondary | ICD-10-CM

## 2016-12-06 DIAGNOSIS — I509 Heart failure, unspecified: Secondary | ICD-10-CM | POA: Diagnosis not present

## 2016-12-06 DIAGNOSIS — J181 Lobar pneumonia, unspecified organism: Secondary | ICD-10-CM

## 2016-12-06 DIAGNOSIS — E43 Unspecified severe protein-calorie malnutrition: Secondary | ICD-10-CM | POA: Diagnosis not present

## 2016-12-06 DIAGNOSIS — G629 Polyneuropathy, unspecified: Secondary | ICD-10-CM | POA: Diagnosis not present

## 2016-12-06 DIAGNOSIS — I110 Hypertensive heart disease with heart failure: Secondary | ICD-10-CM | POA: Diagnosis not present

## 2016-12-06 DIAGNOSIS — J449 Chronic obstructive pulmonary disease, unspecified: Secondary | ICD-10-CM

## 2016-12-09 DIAGNOSIS — J449 Chronic obstructive pulmonary disease, unspecified: Secondary | ICD-10-CM | POA: Diagnosis not present

## 2016-12-09 DIAGNOSIS — J45909 Unspecified asthma, uncomplicated: Secondary | ICD-10-CM | POA: Diagnosis not present

## 2016-12-09 DIAGNOSIS — F17219 Nicotine dependence, cigarettes, with unspecified nicotine-induced disorders: Secondary | ICD-10-CM | POA: Diagnosis not present

## 2016-12-09 DIAGNOSIS — E43 Unspecified severe protein-calorie malnutrition: Secondary | ICD-10-CM | POA: Diagnosis not present

## 2016-12-09 DIAGNOSIS — I739 Peripheral vascular disease, unspecified: Secondary | ICD-10-CM | POA: Diagnosis not present

## 2016-12-09 DIAGNOSIS — J181 Lobar pneumonia, unspecified organism: Secondary | ICD-10-CM | POA: Diagnosis not present

## 2016-12-09 DIAGNOSIS — I110 Hypertensive heart disease with heart failure: Secondary | ICD-10-CM | POA: Diagnosis not present

## 2016-12-09 DIAGNOSIS — G629 Polyneuropathy, unspecified: Secondary | ICD-10-CM | POA: Diagnosis not present

## 2016-12-09 DIAGNOSIS — I509 Heart failure, unspecified: Secondary | ICD-10-CM | POA: Diagnosis not present

## 2016-12-10 ENCOUNTER — Ambulatory Visit (INDEPENDENT_AMBULATORY_CARE_PROVIDER_SITE_OTHER): Payer: Medicare HMO | Admitting: Family Medicine

## 2016-12-10 ENCOUNTER — Encounter: Payer: Self-pay | Admitting: Family Medicine

## 2016-12-10 VITALS — BP 124/70 | HR 90 | Temp 98.7°F | Resp 16 | Ht 61.0 in | Wt 98.2 lb

## 2016-12-10 DIAGNOSIS — R0902 Hypoxemia: Secondary | ICD-10-CM | POA: Diagnosis not present

## 2016-12-10 DIAGNOSIS — J189 Pneumonia, unspecified organism: Secondary | ICD-10-CM

## 2016-12-10 DIAGNOSIS — J438 Other emphysema: Secondary | ICD-10-CM

## 2016-12-10 DIAGNOSIS — J181 Lobar pneumonia, unspecified organism: Secondary | ICD-10-CM | POA: Diagnosis not present

## 2016-12-10 NOTE — Progress Notes (Signed)
Patient: Cheryl [name] Female    DOB: 17-May-1951   66 y.o.   MRN: [id number] Visit Date: 12/10/2016  Today's Provider: Mila Merryonald Zanetta Dehaan, MD   Chief Complaint  Patient presents with  . Hospitalization Follow-up   Subjective:    HPI   Follow up Hospitalization  Patient was admitted to [facility] on 11/05/2016 and discharged on 11/07/2016. She was discharged to [facility], and from [place] to home 2 weeks ago. States she has been doing well since discharge, back to her baseline. Has mild chronic cough, breathing is stable.  She was treated for Pneumonia. Treatment for this included; Rocephin, azithromycin. Advised to continue inhalers and work on stopping smoking Telephone follow up was done on none She reports good compliance with treatment. She reports this condition is Improved.  ----------------------------------------------------------------     Allergies  Allergen Reactions  . Percocet [Oxycodone-Acetaminophen] Hives and Rash  . Aspirin Nausea And Vomiting and Other (See Comments)    Reaction:  GI upset   . Codeine Nausea And Vomiting and Other (See Comments)    Reaction:  GI upset   . Propoxyphene Other (See Comments) and Nausea Only    GI upset Reaction:  GI upset   . Sulfa Antibiotics Rash and Other (See Comments)    Reaction:  GI upset      Current Outpatient Prescriptions:  .  acetaminophen (TYLENOL) 500 MG tablet, Take 1,000 mg by mouth every 6 (six) hours as needed for mild pain or headache. , Disp: , Rfl:  .  ADVAIR DISKUS 250-50 MCG/DOSE AEPB, inhale 1 dose by mouth twice a day, Disp: 60 each, Rfl: 11 .  albuterol (PROVENTIL HFA;VENTOLIN HFA) 108 (90 BASE) MCG/ACT inhaler, Inhale 2 puffs into the lungs every 4 (four) hours as needed for wheezing or shortness of breath., Disp: 18 g, Rfl: 3 .  albuterol (PROVENTIL) (2.5 MG/3ML) 0.083% nebulizer solution, Take 3 mLs (2.5 mg total) by nebulization every 4 (four) hours as needed for wheezing., Disp: 75 mL,  Rfl: 12 .  ALPRAZolam (XANAX) 0.25 MG tablet, Take 1 tablet (0.25 mg total) by mouth 4 (four) times daily as needed for anxiety., Disp: 10 tablet, Rfl: 0 .  celecoxib (CELEBREX) 100 MG capsule, Take 1 capsule (100 mg total) by mouth 2 (two) times daily as needed., Disp: 60 capsule, Rfl: 1 .  clopidogrel (PLAVIX) 75 MG tablet, Take 75 mg by mouth daily., Disp: , Rfl:  .  cyclobenzaprine (FLEXERIL) 5 MG tablet, take 1-2 tablets by mouth every 8 hours if needed, Disp: 30 tablet, Rfl: 3 .  diphenoxylate-atropine (LOMOTIL) 2.5-0.025 MG per tablet, Take 2 tablets by mouth 4 (four) times daily as needed for diarrhea or loose stools. Reported on 03/11/2016, Disp: , Rfl:  .  donepezil (ARICEPT) 10 MG tablet, Take 10 mg by mouth at bedtime., Disp: , Rfl:  .  estropipate (OGEN) 0.75 MG tablet, Take 1 tablet (0.75 mg total) by mouth every other day., Disp: 1 tablet, Rfl: 1 .  [START ON 12/19/2016] fentaNYL (DURAGESIC - DOSED MCG/HR) 50 MCG/HR, Place 1 patch (50 mcg total) onto the skin every 3 (three) days., Disp: 10 patch, Rfl: 0 .  [START ON 01/18/2017] fentaNYL (DURAGESIC - DOSED MCG/HR) 50 MCG/HR, Place 1 patch (50 mcg total) onto the skin every 3 (three) days., Disp: 10 patch, Rfl: 0 .  [START ON 02/17/2017] fentaNYL (DURAGESIC - DOSED MCG/HR) 50 MCG/HR, Place 1 patch (50 mcg total) onto the skin every 3 (three) days.,  Disp: 10 patch, Rfl: 0 .  fluticasone (FLONASE) 50 MCG/ACT nasal spray, Place 2 sprays into both nostrils daily., Disp: 16 g, Rfl: 1 .  furosemide (LASIX) 40 MG tablet, Take 40 mg by mouth daily., Disp: , Rfl:  .  mupirocin ointment (BACTROBAN) 2 %, Place 1 application into the nose 2 (two) times daily., Disp: 22 g, Rfl: 0 .  pantoprazole (PROTONIX) 40 MG tablet, take 1 tablet by mouth twice a day, Disp: 60 tablet, Rfl: 6 .  potassium chloride (K-DUR,KLOR-CON) 10 MEQ tablet, Take 10 mEq by mouth daily., Disp: , Rfl: 0 .  [START ON 12/19/2016] pregabalin (LYRICA) 150 MG capsule, Take 1 capsule (150  mg total) by mouth 3 (three) times daily., Disp: 90 capsule, Rfl: 2 .  simvastatin (ZOCOR) 10 MG tablet, Take 10 mg by mouth at bedtime. Reported on 12/04/2015, Disp: , Rfl:  .  SPIRIVA HANDIHALER 18 MCG inhalation capsule, inhale the contents of one capsule in the handihaler once daily, Disp: 30 capsule, Rfl: 12 .  SUMAtriptan (IMITREX) 25 MG tablet, Take 25 mg by mouth as needed for migraine. May repeat in 2 hours if headache persists or recurs., Disp: , Rfl:  .  [START ON 12/19/2016] traMADol (ULTRAM) 50 MG tablet, Take 2 tablets (100 mg total) by mouth every 6 (six) hours as needed for moderate pain or severe pain., Disp: 240 tablet, Rfl: 2 .  triamcinolone ointment (KENALOG) 0.5 %, Apply to lesions on hands twice a day as needed, Disp: 30 g, Rfl: 1  Review of Systems  Constitutional: Negative for appetite change, chills, fatigue and fever.  Respiratory: Negative for chest tightness and shortness of breath.   Cardiovascular: Negative for chest pain and palpitations.  Gastrointestinal: Negative for abdominal pain, nausea and vomiting.  Neurological: Negative for dizziness and weakness.    Social History  Substance Use Topics  . Smoking status: Current Every Day Smoker    Packs/day: 0.50    Years: 50.00    Types: Cigarettes  . Smokeless tobacco: Never Used     Comment: Previously smoked 2 ppd  . Alcohol use No   Objective:   BP 124/70 (BP Location: Right Arm, Patient Position: Sitting, Cuff Size: Normal)   Pulse 90   Temp 98.7 F (37.1 C) (Oral)   Resp 16   Ht 5\' 1"  (1.549 m)   Wt 98 lb 3.2 oz (44.5 kg)   SpO2 99%   BMI 18.55 kg/m   Vitals:   12/10/16 1451 12/10/16 1522  BP: 124/70   Pulse: 90   Resp: 16   Temp: 98.7 F (37.1 C)   TempSrc: Oral   SpO2: 99% (!) 79% with amulation  Weight: 98 lb 3.2 oz (44.5 kg)   Height: 5\' 1"  (1.549 m)     Sao2=92% when ambulating on 2lmp oxygen.   Physical Exam   General Appearance:    Alert, cooperative, no distress  Eyes:     PERRL, conjunctiva/corneas clear, EOM's intact       Lungs:     Clear to auscultation bilaterally, respirations unlabored, distant breath sounds.   Heart:    Regular rate and rhythm  Neurologic:   Awake, alert, oriented x 3. No apparent focal neurological           defect.           Assessment & Plan:     1. Hypoxia Requires portable oxygen for hypoxia with exertion. Order given to patient and they will contact their  RT.   2. Community acquired pneumonia of left lower lobe of lung (HCC) Symptomatically resolved.   3. Other emphysema (HCC) Stable Continue current medications.         Mila Merry, MD  [facility]

## 2016-12-11 ENCOUNTER — Other Ambulatory Visit: Payer: Self-pay | Admitting: *Deleted

## 2016-12-11 NOTE — Patient Outreach (Signed)
[facility]) Care Management  12/11/2016  Cheryl MargaritaMary George [name] 1951-10-19 [id number]  Transition of care  Spoke with patient , reports she is doing good on today, denies shortness of breath, she is  cleaning her bathrooms today.Discussed with patient to avoid using strong cleaners.  Patient discussed her recent visit to PCP on yesterday.  Patient reports she had another fall recently did not get hurt and that she mentioned it to doctor at visit.    Patient denies any new concerns at this time and is agreeable to home visit.  Plan Will plan transition of care  home visit in the next week.   Egbert GaribaldiKimberly Stoy Fenn, RN, [facility] Care Management [phone] Mobile [phone]- Toll Free Main Office

## 2016-12-13 DIAGNOSIS — E43 Unspecified severe protein-calorie malnutrition: Secondary | ICD-10-CM | POA: Diagnosis not present

## 2016-12-13 DIAGNOSIS — I509 Heart failure, unspecified: Secondary | ICD-10-CM | POA: Diagnosis not present

## 2016-12-13 DIAGNOSIS — G629 Polyneuropathy, unspecified: Secondary | ICD-10-CM | POA: Diagnosis not present

## 2016-12-13 DIAGNOSIS — F17219 Nicotine dependence, cigarettes, with unspecified nicotine-induced disorders: Secondary | ICD-10-CM | POA: Diagnosis not present

## 2016-12-13 DIAGNOSIS — J181 Lobar pneumonia, unspecified organism: Secondary | ICD-10-CM | POA: Diagnosis not present

## 2016-12-13 DIAGNOSIS — J449 Chronic obstructive pulmonary disease, unspecified: Secondary | ICD-10-CM | POA: Diagnosis not present

## 2016-12-13 DIAGNOSIS — I739 Peripheral vascular disease, unspecified: Secondary | ICD-10-CM | POA: Diagnosis not present

## 2016-12-13 DIAGNOSIS — J45909 Unspecified asthma, uncomplicated: Secondary | ICD-10-CM | POA: Diagnosis not present

## 2016-12-13 DIAGNOSIS — I110 Hypertensive heart disease with heart failure: Secondary | ICD-10-CM | POA: Diagnosis not present

## 2016-12-15 LAB — TOXASSURE SELECT 13 (MW), URINE

## 2016-12-16 ENCOUNTER — Ambulatory Visit
Admission: RE | Admit: 2016-12-16 | Discharge: 2016-12-16 | Disposition: A | Discharge status: Home / Self Care | Payer: Medicare HMO | Source: Ambulatory Visit | Attending: Family Medicine | Admitting: Family Medicine

## 2016-12-16 DIAGNOSIS — M810 Age-related osteoporosis without current pathological fracture: Secondary | ICD-10-CM

## 2016-12-16 DIAGNOSIS — M419 Scoliosis, unspecified: Secondary | ICD-10-CM | POA: Insufficient documentation

## 2016-12-16 DIAGNOSIS — R110 Nausea: Secondary | ICD-10-CM | POA: Diagnosis not present

## 2016-12-16 DIAGNOSIS — M8580 Other specified disorders of bone density and structure, unspecified site: Secondary | ICD-10-CM | POA: Diagnosis not present

## 2016-12-16 DIAGNOSIS — J449 Chronic obstructive pulmonary disease, unspecified: Secondary | ICD-10-CM | POA: Diagnosis not present

## 2016-12-16 DIAGNOSIS — G609 Hereditary and idiopathic neuropathy, unspecified: Secondary | ICD-10-CM | POA: Diagnosis not present

## 2016-12-16 DIAGNOSIS — M85852 Other specified disorders of bone density and structure, left thigh: Secondary | ICD-10-CM | POA: Diagnosis not present

## 2016-12-16 DIAGNOSIS — N390 Urinary tract infection, site not specified: Secondary | ICD-10-CM | POA: Diagnosis not present

## 2016-12-17 ENCOUNTER — Telehealth: Payer: Self-pay | Admitting: Family Medicine

## 2016-12-17 ENCOUNTER — Encounter: Payer: Self-pay | Admitting: *Deleted

## 2016-12-17 ENCOUNTER — Other Ambulatory Visit: Payer: Self-pay | Admitting: *Deleted

## 2016-12-17 ENCOUNTER — Encounter: Payer: Self-pay | Admitting: Family Medicine

## 2016-12-17 MED ORDER — ESTROPIPATE 0.75 MG PO TABS: 0.7500 mg | ORAL_TABLET | ORAL | 3 refills | Status: DC

## 2016-12-17 NOTE — Telephone Encounter (Signed)
Patient was supposed to get portable oxygen from respiratory therapist. Please check to see if they have gotten this yet. Thanks.

## 2016-12-17 NOTE — Patient Outreach (Signed)
[facility]) Care Management   12/17/2016  Cheryl [name] 14-Sep-1951 [id number]  Cheryl [name] is an 66 y.o. female  Subjective:  Patient discussed she is still slow just taking her time. Patient denies shortness of breath, increased cough, still has light yellow sputum. Patient reports she working on cleaning her counter tops off today and how she picks a area in her home to clean or straighten each day and just takes her time.  Patient discussed her recent visit with PCP . Patient still followed by home health RN, stated she has met her physical therapy goals and they are not longer seeing her.  Patient reports her recent fall,while in her laundry  but not injury states I think I am just clumsy. Patient reports she uses her walker all the time.   Patient discussed her recent weight of 96 lbs, and how she is has always being a picky eater.Discussed eating candy, fruits, nuts as snacks throughout the day until her dinner meal which is her main meal.      Objective:  BP 120/70 (BP Location: Left Arm, Patient Position: Sitting)   Pulse 72   Resp 18   Wt 95 lb (43.1 kg) Comment: reported home weight  SpO2 96%   BMI 17.95 kg/m   Patient using rolling walker.  Review of Systems  Constitutional: Negative.   HENT: Negative.   Eyes: Negative.   Respiratory: Positive for cough.        Light yellow sputum , not increased   Cardiovascular: Negative.   Gastrointestinal: Negative.   Genitourinary: Negative.   Musculoskeletal: Positive for back pain and falls.  Skin: Negative.   Neurological: Negative.   Endo/Heme/Allergies: Bruises/bleeds easily.  Psychiatric/Behavioral: Negative.     Physical Exam  Constitutional: She is oriented to person, place, and time. She appears well-developed. She appears cachectic.  Cardiovascular: Normal rate and normal heart sounds.   Respiratory: Effort normal.  GI: Soft.  Musculoskeletal:  Kyphosis   Neurological: She is  alert and oriented to person, place, and time.  Skin: Skin is warm and dry.  Psychiatric: She has a normal mood and affect. Her behavior is normal. Judgment and thought content normal.    Encounter Medications:   Outpatient Encounter Prescriptions as of 12/17/2016  Medication Sig Note  . acetaminophen (TYLENOL) 500 MG tablet Take 1,000 mg by mouth every 6 (six) hours as needed for mild pain or headache.    . ADVAIR DISKUS 250-50 MCG/DOSE AEPB inhale 1 dose by mouth twice a day   . albuterol (PROVENTIL HFA;VENTOLIN HFA) 108 (90 BASE) MCG/ACT inhaler Inhale 2 puffs into the lungs every 4 (four) hours as needed for wheezing or shortness of breath.   Marland Kitchen albuterol (PROVENTIL) (2.5 MG/3ML) 0.083% nebulizer solution Take 3 mLs (2.5 mg total) by nebulization every 4 (four) hours as needed for wheezing.   Marland Kitchen ALPRAZolam (XANAX) 0.25 MG tablet Take 1 tablet (0.25 mg total) by mouth 4 (four) times daily as needed for anxiety.   . celecoxib (CELEBREX) 100 MG capsule Take 1 capsule (100 mg total) by mouth 2 (two) times daily as needed. 12/04/2016: Taking only as needed  . clopidogrel (PLAVIX) 75 MG tablet Take 75 mg by mouth daily.   . cyclobenzaprine (FLEXERIL) 5 MG tablet take 1-2 tablets by mouth every 8 hours if needed   . diphenoxylate-atropine (LOMOTIL) 2.5-0.025 MG per tablet Take 2 tablets by mouth 4 (four) times daily as needed for diarrhea or loose stools. Reported on  03/11/2016   . donepezil (ARICEPT) 10 MG tablet Take 10 mg by mouth at bedtime.   Derrill Memo ON 02/17/2017] fentaNYL (DURAGESIC - DOSED MCG/HR) 50 MCG/HR Place 1 patch (50 mcg total) onto the skin every 3 (three) days. 12/07/2016: DO NOT DELETE, even if Expired!!! See Dr. Adalberto Cole care coordination note.  . fluticasone (FLONASE) 50 MCG/ACT nasal spray Place 2 sprays into both nostrils daily.   . furosemide (LASIX) 40 MG tablet Take 40 mg by mouth daily.   . mupirocin ointment (BACTROBAN) 2 % Place 1 application into the nose 2 (two) times  daily.   . pantoprazole (PROTONIX) 40 MG tablet take 1 tablet by mouth twice a day   . [START ON 12/19/2016] pregabalin (LYRICA) 150 MG capsule Take 1 capsule (150 mg total) by mouth 3 (three) times daily.   . simvastatin (ZOCOR) 10 MG tablet Take 10 mg by mouth at bedtime. Reported on 12/04/2015   . SPIRIVA HANDIHALER 18 MCG inhalation capsule inhale the contents of one capsule in the handihaler once daily   . SUMAtriptan (IMITREX) 25 MG tablet Take 25 mg by mouth as needed for migraine. May repeat in 2 hours if headache persists or recurs.   Derrill Memo ON 12/19/2016] traMADol (ULTRAM) 50 MG tablet Take 2 tablets (100 mg total) by mouth every 6 (six) hours as needed for moderate pain or severe pain. 12/07/2016: DO NOT DELETE, even if Expired!!! See Dr. Adalberto Cole care coordination note.  . triamcinolone ointment (KENALOG) 0.5 % Apply to lesions on hands twice a day as needed   . [DISCONTINUED] estropipate (OGEN) 0.75 MG tablet Take 1 tablet (0.75 mg total) by mouth every other day.   Derrill Memo ON 12/19/2016] fentaNYL (DURAGESIC - DOSED MCG/HR) 50 MCG/HR Place 1 patch (50 mcg total) onto the skin every 3 (three) days. 12/07/2016: DO NOT DELETE, even if Expired!!! See Dr. Adalberto Cole care coordination note.  Derrill Memo ON 01/18/2017] fentaNYL (DURAGESIC - DOSED MCG/HR) 50 MCG/HR Place 1 patch (50 mcg total) onto the skin every 3 (three) days. 12/07/2016: DO NOT DELETE, even if Expired!!! See Dr. Adalberto Cole care coordination note.  . potassium chloride (K-DUR,KLOR-CON) 10 MEQ tablet Take 10 mEq by mouth daily.    No facility-administered encounter medications on file as of 12/17/2016.   Patient was recently discharged from hospital and all medications have been reviewed.  Functional Status:   In your present state of health, do you have any difficulty performing the following activities: 12/11/2016 11/12/2016  Hearing? N N  Vision? N N  Difficulty concentrating or making decisions? N N  Walking or climbing stairs? Y  N  Dressing or bathing? Y N  Doing errands, shopping? Y N  Preparing Food and eating ? Y Y  Using the Toilet? Y N  In the past six months, have you accidently leaked urine? Y Y  Do you have problems with loss of bowel control? N Y  Managing your Medications? Y Y  Managing your Finances? [name]  Housekeeping or managing your Housekeeping? N N  Some recent data might be hidden    Fall/Depression Screening:    PHQ 2/9 Scores 12/11/2016 12/05/2016 11/12/2016 08/20/2016 07/08/2016 07/03/2016 06/17/2016  PHQ - 2 Score 0 0 0 0 0 0 0    Assessment:    COPD/Recent Pneumonia Patient reports she is in green zone for breathing when reviewed symptoms. Talking in complete sentences. Using oxygen at night at times, but always carries tank when she leaves her home. Patient  still smoking . Patient able to drive self to some appointments.  Needs reinforcement in making sure she has good ventilation when using cleaning products, and alternatives to use for cleaning .  Fall Risk Patient continues to experience falls at least one every 2 weeks per report, no injury. Reinforce fall prevention measures.   Weight loss Patient typically eat one meal a day, and snacks throughout the day.  Will reinforce healthy snack options .      Plan:  Will follow up with patient in next week by telephone for transition of care outreach. Will send transition visit note to PCP.   [facility] CM Care Plan Problem One   Flowsheet Row Most Recent Value  Care Plan Problem One  High risk for readmission related to hospital admission for pneumonia   Role Documenting the Problem One  Care Management [place] for Problem One  Active  [facility] Long Term Goal (31-90 days)  Patient will not experience a hospital readmission in the next 60 days   [facility] Long Term Goal Start Date  12/04/16  Interventions for Problem One Long Term Goal  Provided and reviewed EMMI handout on pneumonia   [facility] CM Short Term Goal #1 (0-30 days)  Patient will  make appointment with PCP and attend visit in the next 14 days   [facility] CM Short Term Goal #1 Start Date  12/04/16  [facility] CM Short Term Goal #1 Met Date  12/11/16  [facility] CM Short Term Goal #2 (0-30 days)  Patient will be able to state  fall prevention strategies to use in the next 30 days  [goal restated ]  [facility] CM Short Term Goal #2 Start Date  12/11/16  Interventions for Short Term Goal #2  Discussed slowing her pace even with walker,stand still for a few seconds after rising before starting to walk  keep phone near by at all times,   [facility] CM Short Term Goal #3 (0-30 days)  Patient will report no weight loss in the next 30 days   [facility] CM Short Term Goal #3 Start Date  12/11/16  Interventions for Short Tern Goal #3  Provided and reviewed EMMI handout on eating , extra calories when sick , reviewed benefits of balanced diet   [facility] CM Short Term Goal #4 (0-30 days)  Patient will be able to state symptoms of worsening COPD to notify MD of in the next 30 days   [facility] CM Short Term Goal #4 Start Date  12/17/16  Interventions for Short Term Goal #4  Provided and reviewed COPD when to get symptoms and yellow zone symptoms and action plan.      Joylene Draft, RN, [place] Management [phone]- Mobile [phone]- Toll Free Main Office

## 2016-12-18 NOTE — Telephone Encounter (Signed)
Called patient and she reports that she has not received her portable oxygen yet.

## 2016-12-18 NOTE — Telephone Encounter (Signed)
Please call [facility] and see if they go order for portable oxygen to use during ambulation. He o2 drops to 74 % when ambulating.

## 2016-12-18 NOTE — Telephone Encounter (Signed)
Patient reports that she did give the Rx to the respiratory therapist, but she does not know why she has not received it yet. [facility]

## 2016-12-18 NOTE — Telephone Encounter (Signed)
I thought we sent written with patient at her last visit for them to give respiratory therapist. Please check and see which company they are using and we can send another order. Thanks.

## 2016-12-18 NOTE — Telephone Encounter (Signed)
Rx called in to pharmacy. 

## 2016-12-23 ENCOUNTER — Emergency Department: Payer: Medicare HMO

## 2016-12-23 ENCOUNTER — Encounter: Payer: Self-pay | Admitting: Emergency Medicine

## 2016-12-23 ENCOUNTER — Inpatient Hospital Stay
Admission: EM | Admit: 2016-12-23 | Discharge: 2016-12-25 | DRG: 193 | Disposition: A | Discharge status: Skilled Nursing Facility | Payer: Medicare HMO | Attending: Internal Medicine | Admitting: Internal Medicine

## 2016-12-23 DIAGNOSIS — Z882 Allergy status to sulfonamides status: Secondary | ICD-10-CM | POA: Diagnosis not present

## 2016-12-23 DIAGNOSIS — Z886 Allergy status to analgesic agent status: Secondary | ICD-10-CM

## 2016-12-23 DIAGNOSIS — E43 Unspecified severe protein-calorie malnutrition: Secondary | ICD-10-CM | POA: Diagnosis present

## 2016-12-23 DIAGNOSIS — E876 Hypokalemia: Secondary | ICD-10-CM

## 2016-12-23 DIAGNOSIS — Z9981 Dependence on supplemental oxygen: Secondary | ICD-10-CM | POA: Diagnosis not present

## 2016-12-23 DIAGNOSIS — R1310 Dysphagia, unspecified: Secondary | ICD-10-CM | POA: Diagnosis present

## 2016-12-23 DIAGNOSIS — R1312 Dysphagia, oropharyngeal phase: Secondary | ICD-10-CM | POA: Diagnosis not present

## 2016-12-23 DIAGNOSIS — Z833 Family history of diabetes mellitus: Secondary | ICD-10-CM | POA: Diagnosis not present

## 2016-12-23 DIAGNOSIS — G8929 Other chronic pain: Secondary | ICD-10-CM | POA: Diagnosis present

## 2016-12-23 DIAGNOSIS — M6281 Muscle weakness (generalized): Secondary | ICD-10-CM

## 2016-12-23 DIAGNOSIS — I10 Essential (primary) hypertension: Secondary | ICD-10-CM | POA: Diagnosis not present

## 2016-12-23 DIAGNOSIS — Z7902 Long term (current) use of antithrombotics/antiplatelets: Secondary | ICD-10-CM

## 2016-12-23 DIAGNOSIS — E785 Hyperlipidemia, unspecified: Secondary | ICD-10-CM | POA: Diagnosis present

## 2016-12-23 DIAGNOSIS — I509 Heart failure, unspecified: Secondary | ICD-10-CM | POA: Diagnosis not present

## 2016-12-23 DIAGNOSIS — Z741 Need for assistance with personal care: Secondary | ICD-10-CM | POA: Diagnosis not present

## 2016-12-23 DIAGNOSIS — R531 Weakness: Secondary | ICD-10-CM | POA: Diagnosis not present

## 2016-12-23 DIAGNOSIS — I110 Hypertensive heart disease with heart failure: Secondary | ICD-10-CM | POA: Diagnosis present

## 2016-12-23 DIAGNOSIS — I2510 Atherosclerotic heart disease of native coronary artery without angina pectoris: Secondary | ICD-10-CM | POA: Diagnosis not present

## 2016-12-23 DIAGNOSIS — D729 Disorder of white blood cells, unspecified: Secondary | ICD-10-CM | POA: Diagnosis not present

## 2016-12-23 DIAGNOSIS — M549 Dorsalgia, unspecified: Secondary | ICD-10-CM | POA: Diagnosis not present

## 2016-12-23 DIAGNOSIS — Z888 Allergy status to other drugs, medicaments and biological substances status: Secondary | ICD-10-CM

## 2016-12-23 DIAGNOSIS — R2681 Unsteadiness on feet: Secondary | ICD-10-CM | POA: Diagnosis not present

## 2016-12-23 DIAGNOSIS — G629 Polyneuropathy, unspecified: Secondary | ICD-10-CM | POA: Diagnosis not present

## 2016-12-23 DIAGNOSIS — Z885 Allergy status to narcotic agent status: Secondary | ICD-10-CM

## 2016-12-23 DIAGNOSIS — D72829 Elevated white blood cell count, unspecified: Secondary | ICD-10-CM | POA: Diagnosis not present

## 2016-12-23 DIAGNOSIS — G9341 Metabolic encephalopathy: Secondary | ICD-10-CM

## 2016-12-23 DIAGNOSIS — Z7951 Long term (current) use of inhaled steroids: Secondary | ICD-10-CM

## 2016-12-23 DIAGNOSIS — Z8673 Personal history of transient ischemic attack (TIA), and cerebral infarction without residual deficits: Secondary | ICD-10-CM

## 2016-12-23 DIAGNOSIS — J189 Pneumonia, unspecified organism: Principal | ICD-10-CM | POA: Diagnosis present

## 2016-12-23 DIAGNOSIS — F419 Anxiety disorder, unspecified: Secondary | ICD-10-CM | POA: Diagnosis present

## 2016-12-23 DIAGNOSIS — Z79899 Other long term (current) drug therapy: Secondary | ICD-10-CM | POA: Diagnosis not present

## 2016-12-23 DIAGNOSIS — F17210 Nicotine dependence, cigarettes, uncomplicated: Secondary | ICD-10-CM | POA: Diagnosis not present

## 2016-12-23 DIAGNOSIS — G92 Toxic encephalopathy: Secondary | ICD-10-CM | POA: Diagnosis present

## 2016-12-23 DIAGNOSIS — J181 Lobar pneumonia, unspecified organism: Secondary | ICD-10-CM | POA: Diagnosis not present

## 2016-12-23 DIAGNOSIS — J690 Pneumonitis due to inhalation of food and vomit: Secondary | ICD-10-CM | POA: Diagnosis not present

## 2016-12-23 DIAGNOSIS — Z8249 Family history of ischemic heart disease and other diseases of the circulatory system: Secondary | ICD-10-CM | POA: Diagnosis not present

## 2016-12-23 DIAGNOSIS — Z681 Body mass index (BMI) 19 or less, adult: Secondary | ICD-10-CM | POA: Diagnosis not present

## 2016-12-23 DIAGNOSIS — R262 Difficulty in walking, not elsewhere classified: Secondary | ICD-10-CM | POA: Diagnosis not present

## 2016-12-23 DIAGNOSIS — J440 Chronic obstructive pulmonary disease with acute lower respiratory infection: Secondary | ICD-10-CM | POA: Diagnosis not present

## 2016-12-23 DIAGNOSIS — R0602 Shortness of breath: Secondary | ICD-10-CM | POA: Diagnosis not present

## 2016-12-23 DIAGNOSIS — M797 Fibromyalgia: Secondary | ICD-10-CM

## 2016-12-23 HISTORY — DX: Metabolic encephalopathy: G93.41

## 2016-12-23 LAB — CBC
HCT: 29.2 % — ABNORMAL LOW (ref 35.0–47.0)
Hemoglobin: 9.8 g/dL — ABNORMAL LOW (ref 12.0–16.0)
MCH: 28.9 pg (ref 26.0–34.0)
MCHC: 33.7 g/dL (ref 32.0–36.0)
MCV: 85.7 fL (ref 80.0–100.0)
PLATELETS: 271 10*3/uL (ref 150–440)
RBC: 3.41 MIL/uL — AB (ref 3.80–5.20)
RDW: 17.2 % — AB (ref 11.5–14.5)
WBC: 9.9 10*3/uL (ref 3.6–11.0)

## 2016-12-23 LAB — LACTIC ACID, PLASMA: Lactic Acid, Venous: 0.7 mmol/L (ref 0.5–1.9)

## 2016-12-23 LAB — CBC WITH DIFFERENTIAL/PLATELET
BASOS ABS: 0 10*3/uL (ref 0–0.1)
BASOS PCT: 0 %
Eosinophils Absolute: 0 10*3/uL (ref 0–0.7)
Eosinophils Relative: 0 %
HEMATOCRIT: 28.4 % — AB (ref 35.0–47.0)
HEMOGLOBIN: 9.6 g/dL — AB (ref 12.0–16.0)
Lymphocytes Relative: 4 %
Lymphs Abs: 0.5 10*3/uL — ABNORMAL LOW (ref 1.0–3.6)
MCH: 28.6 pg (ref 26.0–34.0)
MCHC: 33.8 g/dL (ref 32.0–36.0)
MCV: 84.8 fL (ref 80.0–100.0)
MONO ABS: 0.7 10*3/uL (ref 0.2–0.9)
Monocytes Relative: 7 %
Neutro Abs: 10 10*3/uL — ABNORMAL HIGH (ref 1.4–6.5)
Neutrophils Relative %: 89 %
Platelets: 251 10*3/uL (ref 150–440)
RBC: 3.35 MIL/uL — AB (ref 3.80–5.20)
RDW: 17.4 % — ABNORMAL HIGH (ref 11.5–14.5)
WBC: 11.3 10*3/uL — ABNORMAL HIGH (ref 3.6–11.0)

## 2016-12-23 LAB — MRSA PCR SCREENING: MRSA BY PCR: NEGATIVE

## 2016-12-23 LAB — PROCALCITONIN

## 2016-12-23 LAB — BLOOD GAS, VENOUS
Acid-Base Excess: 4.4 mmol/L — ABNORMAL HIGH (ref 0.0–2.0)
BICARBONATE: 29.8 mmol/L — AB (ref 20.0–28.0)
O2 SAT: 83 %
PATIENT TEMPERATURE: 37
PO2 VEN: 47 mmHg — AB (ref 32.0–45.0)
pCO2, Ven: 47 mmHg (ref 44.0–60.0)
pH, Ven: 7.41 (ref 7.250–7.430)

## 2016-12-23 LAB — CREATININE, SERUM: CREATININE: 0.71 mg/dL (ref 0.44–1.00)

## 2016-12-23 LAB — URINALYSIS, ROUTINE W REFLEX MICROSCOPIC
Bacteria, UA: NONE SEEN
Bilirubin Urine: NEGATIVE
GLUCOSE, UA: NEGATIVE mg/dL
HGB URINE DIPSTICK: NEGATIVE
KETONES UR: 80 mg/dL — AB
Leukocytes, UA: NEGATIVE
NITRITE: NEGATIVE
PROTEIN: 30 mg/dL — AB
Specific Gravity, Urine: 1.023 (ref 1.005–1.030)
pH: 5 (ref 5.0–8.0)

## 2016-12-23 LAB — INFLUENZA PANEL BY PCR (TYPE A & B)
Influenza A By PCR: NEGATIVE
Influenza B By PCR: NEGATIVE

## 2016-12-23 LAB — COMPREHENSIVE METABOLIC PANEL
ALBUMIN: 3.1 g/dL — AB (ref 3.5–5.0)
ALT: 7 U/L — AB (ref 14–54)
AST: 13 U/L — AB (ref 15–41)
Alkaline Phosphatase: 107 U/L (ref 38–126)
Anion gap: 9 (ref 5–15)
BILIRUBIN TOTAL: 1 mg/dL (ref 0.3–1.2)
BUN: 17 mg/dL (ref 6–20)
CO2: 28 mmol/L (ref 22–32)
CREATININE: 0.84 mg/dL (ref 0.44–1.00)
Calcium: 8.4 mg/dL — ABNORMAL LOW (ref 8.9–10.3)
Chloride: 99 mmol/L — ABNORMAL LOW (ref 101–111)
GFR calc Af Amer: >60 mL/min (ref >60)
GFR calc non Af Amer: >60 mL/min (ref >60)
GLUCOSE: 77 mg/dL (ref 65–99)
POTASSIUM: 3.1 mmol/L — AB (ref 3.5–5.1)
Sodium: 136 mmol/L (ref 135–145)
Total Protein: 6.7 g/dL (ref 6.5–8.1)

## 2016-12-23 LAB — GLUCOSE, CAPILLARY: GLUCOSE-CAPILLARY: 74 mg/dL (ref 65–99)

## 2016-12-23 LAB — BRAIN NATRIURETIC PEPTIDE: B Natriuretic Peptide: 120 pg/mL — ABNORMAL HIGH (ref 0.0–100.0)

## 2016-12-23 LAB — MAGNESIUM: Magnesium: 1.9 mg/dL (ref 1.7–2.4)

## 2016-12-23 MED ORDER — SODIUM CHLORIDE 0.9% FLUSH
3.0000 mL | Freq: Two times a day (BID) | INTRAVENOUS | Status: DC
  Administered 2016-12-24 – 2016-12-25 (×2): 3 mL via INTRAVENOUS

## 2016-12-23 MED ORDER — ESTROPIPATE 0.75 MG PO TABS
0.7500 mg | ORAL_TABLET | ORAL | Status: DC
  Administered 2016-12-25: 0.75 mg via ORAL
  Filled 2016-12-23 (×2): qty 1

## 2016-12-23 MED ORDER — POTASSIUM CHLORIDE IN NACL 20-0.9 MEQ/L-% IV SOLN
INTRAVENOUS | Status: DC
  Administered 2016-12-23 – 2016-12-24 (×2): via INTRAVENOUS
  Filled 2016-12-23 (×5): qty 1000

## 2016-12-23 MED ORDER — BUDESONIDE 0.25 MG/2ML IN SUSP
0.2500 mg | Freq: Two times a day (BID) | RESPIRATORY_TRACT | Status: DC
  Administered 2016-12-23 – 2016-12-25 (×4): 0.25 mg via RESPIRATORY_TRACT
  Filled 2016-12-23 (×4): qty 2

## 2016-12-23 MED ORDER — ACETAMINOPHEN 650 MG RE SUPP: 650.0000 mg | Freq: Four times a day (QID) | RECTAL | Status: DC | PRN

## 2016-12-23 MED ORDER — ALBUTEROL SULFATE (2.5 MG/3ML) 0.083% IN NEBU
2.5000 mg | INHALATION_SOLUTION | RESPIRATORY_TRACT | Status: DC
  Administered 2016-12-23 – 2016-12-24 (×3): 2.5 mg via RESPIRATORY_TRACT
  Filled 2016-12-23 (×4): qty 3

## 2016-12-23 MED ORDER — ENOXAPARIN SODIUM 40 MG/0.4ML ~~LOC~~ SOLN: 40.0000 mg | SUBCUTANEOUS | Status: DC

## 2016-12-23 MED ORDER — CEFEPIME-DEXTROSE 2 GM/50ML IV SOLR
2.0000 g | Freq: Two times a day (BID) | INTRAVENOUS | Status: DC
  Administered 2016-12-23: 2 g via INTRAVENOUS
  Filled 2016-12-23 (×3): qty 50

## 2016-12-23 MED ORDER — VANCOMYCIN HCL IN DEXTROSE 1-5 GM/200ML-% IV SOLN
1000.0000 mg | Freq: Once | INTRAVENOUS | Status: AC
  Administered 2016-12-23: 1000 mg via INTRAVENOUS
  Filled 2016-12-23: qty 200

## 2016-12-23 MED ORDER — ACETAMINOPHEN 325 MG PO TABS
650.0000 mg | ORAL_TABLET | Freq: Four times a day (QID) | ORAL | Status: DC | PRN
  Administered 2016-12-24: 650 mg via ORAL
  Filled 2016-12-23: qty 2

## 2016-12-23 MED ORDER — FLUTICASONE PROPIONATE 50 MCG/ACT NA SUSP
2.0000 | Freq: Every day | NASAL | Status: DC
  Administered 2016-12-24 – 2016-12-25 (×2): 2 via NASAL
  Filled 2016-12-23: qty 16

## 2016-12-23 MED ORDER — ONDANSETRON HCL 4 MG/2ML IJ SOLN
4.0000 mg | Freq: Four times a day (QID) | INTRAMUSCULAR | Status: DC | PRN
  Filled 2016-12-23: qty 2

## 2016-12-23 MED ORDER — CEFEPIME-DEXTROSE 2 GM/50ML IV SOLR
2.0000 g | Freq: Once | INTRAVENOUS | Status: AC
  Administered 2016-12-23: 2 g via INTRAVENOUS
  Filled 2016-12-23: qty 50

## 2016-12-23 MED ORDER — ACETAMINOPHEN 500 MG PO TABS
1000.0000 mg | ORAL_TABLET | Freq: Four times a day (QID) | ORAL | Status: DC | PRN
Start: 2016-12-23 — End: 2016-12-25
  Administered 2016-12-23: 1000 mg via ORAL
  Filled 2016-12-23: qty 2

## 2016-12-23 MED ORDER — ONDANSETRON HCL 4 MG PO TABS
4.0000 mg | ORAL_TABLET | Freq: Four times a day (QID) | ORAL | Status: DC | PRN
Start: 2016-12-23 — End: 2016-12-25

## 2016-12-23 MED ORDER — SIMVASTATIN 10 MG PO TABS
10.0000 mg | ORAL_TABLET | Freq: Every day | ORAL | Status: DC
  Administered 2016-12-23 – 2016-12-24 (×2): 10 mg via ORAL
  Filled 2016-12-23 (×2): qty 1

## 2016-12-23 MED ORDER — DONEPEZIL HCL 5 MG PO TABS
10.0000 mg | ORAL_TABLET | Freq: Every day | ORAL | Status: DC
  Administered 2016-12-23 – 2016-12-24 (×2): 10 mg via ORAL
  Filled 2016-12-23 (×2): qty 2

## 2016-12-23 MED ORDER — VANCOMYCIN HCL IN DEXTROSE 750-5 MG/150ML-% IV SOLN
750.0000 mg | INTRAVENOUS | Status: DC
  Administered 2016-12-24: 750 mg via INTRAVENOUS
  Filled 2016-12-23: qty 150

## 2016-12-23 MED ORDER — TIOTROPIUM BROMIDE MONOHYDRATE 18 MCG IN CAPS
18.0000 ug | ORAL_CAPSULE | Freq: Every day | RESPIRATORY_TRACT | Status: DC
  Administered 2016-12-24 – 2016-12-25 (×2): 18 ug via RESPIRATORY_TRACT
  Filled 2016-12-23: qty 5

## 2016-12-23 MED ORDER — CLOPIDOGREL BISULFATE 75 MG PO TABS
75.0000 mg | ORAL_TABLET | Freq: Every day | ORAL | Status: DC
  Administered 2016-12-24 – 2016-12-25 (×2): 75 mg via ORAL
  Filled 2016-12-23 (×3): qty 1

## 2016-12-23 MED ORDER — PANTOPRAZOLE SODIUM 40 MG PO TBEC
40.0000 mg | DELAYED_RELEASE_TABLET | Freq: Two times a day (BID) | ORAL | Status: DC
  Administered 2016-12-23 – 2016-12-25 (×4): 40 mg via ORAL
  Filled 2016-12-23 (×4): qty 1

## 2016-12-23 NOTE — ED Notes (Signed)
Family member states that fentanyl patch already removed.

## 2016-12-23 NOTE — ED Triage Notes (Signed)
Cough x 1 week, severe weakness on arrival, lifted to bed.

## 2016-12-23 NOTE — ED Provider Notes (Signed)
[facility] Emergency Department Provider Note   ____________________________________________    I have reviewed the triage vital signs and the nursing notes.   HISTORY  Chief Complaint Weakness  History provided by daughter and husband   HPI Cheryl [name] is a 66 y.o. female who presents with change in mental status. Husband reports that patient had just started to recover from recent pneumonia but over the last several days is having worsening cough and increasing weakness and fatigue and this morning significant change in mental status. On home O2 as needed   Past Medical History:  Diagnosis Date  . Allergy   . Anxiety   . Arthritis   . Aspiration pneumonitis (HCC) 11/24/2015  . Asthma   . CHF (congestive heart failure) (HCC)   . Chronic pain   . COPD (chronic obstructive pulmonary disease) (HCC)   . Coronary artery disease   . DVT (deep venous thrombosis) (HCC)   . GERD (gastroesophageal reflux disease)   . Headache   . Hyperlipidemia   . Hypertension   . Migraines   . Neuropathy (HCC) 2010  . Osteoporosis   . Oxygen deficiency   . Peripheral vascular disease (HCC)   . Pneumonia   . Pneumonia 11/19/2015  . Pneumonia 10/2016  . Vitamin D deficiency     Patient Active Problem List   Diagnosis Date Noted  . Protein-calorie malnutrition, severe 11/06/2016  . Nausea with vomiting   . Lumbar facet syndrome (Location of Primary Source of Pain) (Bilateral) (L>R) 03/20/2016  . Lumbar spondylosis 03/20/2016  . Opiate use (160 MME/Day) 02/28/2016  . Anemia 02/28/2016  . Osteoarthrosis 12/18/2015  . Long term current use of anticoagulant therapy (Plavix) 12/18/2015  . Long term current use of opiate analgesic 12/04/2015  . Fibromyalgia 12/04/2015  . Dysphagia 11/24/2015  . Acute diastolic CHF (congestive heart failure) (HCC) 11/24/2015  . Abnormal mammogram of right breast 10/11/2015  . Dilated intrahepatic bile duct 10/11/2015    . Lumbar radicular pain (B) (L>R) (L4) 09/04/2015  . Chronic bilateral low back pain without sciatica 09/04/2015  . Encounter for therapeutic drug level monitoring 09/04/2015  . Uncomplicated opioid dependence (HCC) 09/04/2015  . Chronic pain syndrome 09/04/2015  . Platelet inhibition due to Plavix 09/04/2015  . COPD (chronic obstructive pulmonary disease) (HCC) 07/31/2015  . Dementia 07/31/2015  . Airway hyperreactivity 07/03/2015  . Back pain, thoracic 07/03/2015  . Excessive falling 07/03/2015  . Alteration in bowel elimination: incontinence 07/03/2015  . Insomnia 07/03/2015  . Decreased testosterone level 07/03/2015  . Leg weakness 07/03/2015  . Menopausal symptom 07/03/2015  . Migraine 07/03/2015  . Neuropathy (HCC) 07/03/2015  . Fecal occult blood test positive 07/03/2015  . OP (osteoporosis) 07/03/2015  . Panic disorder 07/03/2015  . Compulsive tobacco user syndrome 07/03/2015  . Urinary incontinence 07/03/2015  . Weight loss 07/03/2015  . Essential hypertension 06/06/2015  . GERD (gastroesophageal reflux disease) 06/06/2015  . Hyperlipemia 06/06/2015  . 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 Anxiety 02/10/2014  . Coronary artery disease 02/10/2014  . Hypercholesteremia 02/10/2014  . Peripheral vascular disease (HCC) 02/10/2014  . Vitamin D deficiency 10/16/2009    Past Surgical History:  Procedure Laterality Date  . ABDOMINAL HYSTERECTOMY  1975   Bilaterl Oophorectomy; Dur to IUD infection  . abdomnal aortic stent  05/30/2008   Dr. Nanetta Batty  . APPENDECTOMY    . APPENDECTOMY    . cardiac catherization  10/31/2009  . CERVICAL FUSION  C5 -  6/C6-7  . CHOLECYSTECTOMY  1972  . COLONOSCOPY WITH PROPOFOL N/A 07/27/2015   Procedure: COLONOSCOPY WITH PROPOFOL;  Surgeon: Wallace CullensPaul Y Oh, MD;  [facility];  Service: Gastroenterology;  Laterality: N/A;  . ESOPHAGOGASTRODUODENOSCOPY (EGD) WITH PROPOFOL N/A 07/27/2015   Procedure: ESOPHAGOGASTRODUODENOSCOPY  (EGD) WITH PROPOFOL;  Surgeon: Wallace CullensPaul Y Oh, MD;  [facility];  Service: Gastroenterology;  Laterality: N/A;  . FOOT SURGERY Bilateral    5-6 years per patient  . SPINE SURGERY      Prior to Admission medications   Medication Sig Start Date End Date Taking? Authorizing Provider  acetaminophen (TYLENOL) 500 MG tablet Take 1,000 mg by mouth every 6 (six) hours as needed for mild pain or headache.    Yes Historical Provider, MD  ADVAIR DISKUS 250-50 MCG/DOSE AEPB inhale 1 dose by mouth twice a day 08/15/16  Yes Malva Limesonald E Fisher, MD  albuterol (PROVENTIL HFA;VENTOLIN HFA) 108 (90 BASE) MCG/ACT inhaler Inhale 2 puffs into the lungs every 4 (four) hours as needed for wheezing or shortness of breath. 08/11/15  Yes Malva Limesonald E Fisher, MD  albuterol (PROVENTIL) (2.5 MG/3ML) 0.083% nebulizer solution Take 3 mLs (2.5 mg total) by nebulization every 4 (four) hours as needed for wheezing. 10/12/15  Yes Katharina Caperima Vaickute, MD  ALPRAZolam Prudy Feeler(XANAX) 0.25 MG tablet Take 1 tablet (0.25 mg total) by mouth 4 (four) times daily as needed for anxiety. 11/07/16  Yes Delfino LovettVipul Shah, MD  celecoxib (CELEBREX) 100 MG capsule Take 1 capsule (100 mg total) by mouth 2 (two) times daily as needed. 10/24/16  Yes Malva Limesonald E Fisher, MD  clopidogrel (PLAVIX) 75 MG tablet Take 75 mg by mouth daily.   Yes Historical Provider, MD  cyclobenzaprine (FLEXERIL) 5 MG tablet take 1-2 tablets by mouth every 8 hours if needed 04/23/16  Yes Malva Limesonald E Fisher, MD  diphenoxylate-atropine (LOMOTIL) 2.5-0.025 MG per tablet Take 2 tablets by mouth 4 (four) times daily as needed for diarrhea or loose stools. Reported on 03/11/2016   Yes Historical Provider, MD  donepezil (ARICEPT) 10 MG tablet Take 10 mg by mouth at bedtime.   Yes Historical Provider, MD  estropipate (OGEN) 0.75 MG tablet Take 1 tablet (0.75 mg total) by mouth every other day. 12/17/16  Yes Malva Limesonald E Fisher, MD  fentaNYL (DURAGESIC - DOSED MCG/HR) 50 MCG/HR Place 1 patch (50 mcg total) onto the  skin every 3 (three) days. 12/19/16 01/18/17 Yes Delano MetzFrancisco Naveira, MD  fentaNYL (DURAGESIC - DOSED MCG/HR) 50 MCG/HR Place 1 patch (50 mcg total) onto the skin every 3 (three) days. 01/18/17 02/17/17 Yes Delano MetzFrancisco Naveira, MD  fentaNYL (DURAGESIC - DOSED MCG/HR) 50 MCG/HR Place 1 patch (50 mcg total) onto the skin every 3 (three) days. 02/17/17 03/19/17 Yes Delano MetzFrancisco Naveira, MD  fluticasone (FLONASE) 50 MCG/ACT nasal spray Place 2 sprays into both nostrils daily. 04/09/16  Yes Malva Limesonald E Fisher, MD  furosemide (LASIX) 40 MG tablet Take 40 mg by mouth daily.   Yes Historical Provider, MD  mupirocin ointment (BACTROBAN) 2 % Place 1 application into the nose 2 (two) times daily. 10/24/16  Yes Malva Limesonald E Fisher, MD  pantoprazole (PROTONIX) 40 MG tablet take 1 tablet by mouth twice a day 10/25/16  Yes Malva Limesonald E Fisher, MD  potassium chloride (K-DUR,KLOR-CON) 10 MEQ tablet Take 10 mEq by mouth daily. 10/29/16  Yes Historical Provider, MD  pregabalin (LYRICA) 150 MG capsule Take 1 capsule (150 mg total) by mouth 3 (three) times daily. 12/19/16 03/19/17 Yes Delano MetzFrancisco Naveira, MD  simvastatin (ZOCOR)  10 MG tablet Take 10 mg by mouth at bedtime. Reported on 12/04/2015   Yes Historical Provider, MD  SPIRIVA HANDIHALER 18 MCG inhalation capsule inhale the contents of one capsule in the handihaler once daily 11/16/16  Yes Malva Limes, MD  SUMAtriptan (IMITREX) 25 MG tablet Take 25 mg by mouth as needed for migraine. May repeat in 2 hours if headache persists or recurs.   Yes Historical Provider, MD  traMADol (ULTRAM) 50 MG tablet Take 2 tablets (100 mg total) by mouth every 6 (six) hours as needed for moderate pain or severe pain. 12/19/16 03/19/17 Yes Delano Metz, MD  triamcinolone ointment (KENALOG) 0.5 % Apply to lesions on hands twice a day as needed 10/24/16  Yes Malva Limes, MD     Allergies Percocet [oxycodone-acetaminophen]; Aspirin; Codeine; Propoxyphene; and Sulfa antibiotics  Family History  Problem  Relation Age of Onset  . Cancer Mother   . Arthritis Mother   . Heart disease Mother   . Diabetes Mother     mellitus, type 2  . Heart disease Father   . Diabetes Sister   . Cancer Brother   . Cancer Brother     lung  . Diabetes Brother     Social History Social History  Substance Use Topics  . Smoking status: Current Every Day Smoker    Packs/day: 0.50    Years: 50.00    Types: Cigarettes  . Smokeless tobacco: Never Used     Comment: Previously smoked 2 ppd  . Alcohol use No    Review of SystemsPer family  Constitutional: No fever  Cardiovascular: No reports of chest pain Respiratory: Reports of increasing shortness of breath and productive cough Gastrointestinal: No history of vomiting  Genitourinary: Dysuria not reported by patient  Skin: Negative for rash. Neurological: Negative for headaches  10-point ROS otherwise negative.  ____________________________________________   PHYSICAL EXAM:  VITAL SIGNS: ED Triage Vitals  Enc Vitals Group     BP 12/23/16 0913 (!) 149/47     Pulse Rate 12/23/16 0913 70     Resp 12/23/16 0913 20     Temp 12/23/16 0913 98.1 F (36.7 C)     Temp Source 12/23/16 0913 Oral     SpO2 12/23/16 0913 (!) 89 %     Weight 12/23/16 0914 95 lb (43.1 kg)     Height 12/23/16 0914 5\' 1"  (1.549 m)     Head Circumference --      Peak Flow --      Pain Score --      Pain Loc --      Pain Edu? --      Excl. in GC? --     Constitutional: Responds to stimuli but is disoriented, chronically ill appearing Eyes: Conjunctivae are normal.  Head: Atraumatic. Nose: No congestion/rhinnorhea. Mouth/Throat: Mucous membranes are dry Neck:  Painless ROM Cardiovascular: Normal rate, regular rhythm. Grossly normal heart sounds.  Good peripheral circulation. Respiratory: Bibasilar rales, scattered wheezes Gastrointestinal: Soft and nontender. No distention.  No CVA tenderness. Genitourinary: deferred Musculoskeletal: No lower extremity tenderness  nor edema.  Warm and well perfused Neurologic:  No gross focal neurologic deficits are appreciated.  Skin:  Skin is warm, dry and intact. No rash noted.   ____________________________________________   LABS (all labs ordered are listed, but only abnormal results are displayed)  Labs Reviewed  URINALYSIS, ROUTINE W REFLEX MICROSCOPIC - Abnormal; Notable for the following:       Result Value   Color,  Urine YELLOW (*)    APPearance CLEAR (*)    Ketones, ur 80 (*)    Protein, ur 30 (*)    Squamous Epithelial / LPF 0-5 (*)    All other components within normal limits  CBC WITH DIFFERENTIAL/PLATELET - Abnormal; Notable for the following:    WBC 11.3 (*)    RBC 3.35 (*)    Hemoglobin 9.6 (*)    HCT 28.4 (*)    RDW 17.4 (*)    Neutro Abs 10.0 (*)    Lymphs Abs 0.5 (*)    All other components within normal limits  COMPREHENSIVE METABOLIC PANEL - Abnormal; Notable for the following:    Potassium 3.1 (*)    Chloride 99 (*)    Calcium 8.4 (*)    Albumin 3.1 (*)    AST 13 (*)    ALT 7 (*)    All other components within normal limits  BRAIN NATRIURETIC PEPTIDE - Abnormal; Notable for the following:    B Natriuretic Peptide 120.0 (*)    All other components within normal limits  BLOOD GAS, VENOUS - Abnormal; Notable for the following:    pO2, Ven 47.0 (*)    Bicarbonate 29.8 (*)    Acid-Base Excess 4.4 (*)    All other components within normal limits  CULTURE, BLOOD (ROUTINE X 2)  CULTURE, BLOOD (ROUTINE X 2)  URINE CULTURE  LACTIC ACID, PLASMA  INFLUENZA PANEL BY PCR (TYPE A & B)  LACTIC ACID, PLASMA  CBC WITH DIFFERENTIAL/PLATELET   ____________________________________________  EKG  None ____________________________________________  RADIOLOGY  Chest x-ray suspicious for recurrent pneumonia ____________________________________________   PROCEDURES  Procedure(s) performed: No    Critical Care performed:  No ____________________________________________   INITIAL IMPRESSION / ASSESSMENT AND PLAN / ED COURSE  Pertinent labs & imaging results that were available during my care of the patient were reviewed by me and considered in my medical decision making (see chart for details).  X-ray suspicious for recurrent pneumonia. We will treat with vancomycin and cefepime, patient will require admission    ____________________________________________   FINAL CLINICAL IMPRESSION(S) / ED DIAGNOSES  Final diagnoses:  Recurrent pneumonia      NEW MEDICATIONS STARTED DURING THIS VISIT:  New Prescriptions   No medications on file     Note:  This document was prepared using Dragon voice recognition software and may include unintentional dictation errors.    Jene Every, MD 12/23/16 1209

## 2016-12-23 NOTE — H&P (Signed)
[facility] - [place] at [facility]   PATIENT NAME: Cheryl [name]    MR#:  [id number]  DATE OF BIRTH:  02-10-1951  DATE OF ADMISSION:  12/23/2016  PRIMARY CARE PHYSICIAN: Mila Merry, MD   REQUESTING/REFERRING PHYSICIAN:   CHIEF COMPLAINT:   Chief Complaint  Patient presents with  . Weakness    HISTORY OF PRESENT ILLNESS: Cheryl [name]  is a 66 y.o. female with a known history of Aspiration pneumonitis, dysphagia, congestive heart failure, coronary artery disease, DVT, hypertension, hyperlipidemia, neuropathy, who presents to the hospital with complaints of poor responsiveness, altered mental status. I want to patient's daughter, who was present during my interview, patient was admitted from the toes to 14th of December 2017 for pneumonia to the hospital after which she was discharged to [facility] mutation facility. She is out from a rehabilitation facility for the past 2 weeks. However, over the past few days she's been noted to be progressively weaker. She was found on the floor yesterday in the morning. Over the past 24 hours she became progressively more somnolent is not talking, not eating, not taking her medications. Patient's daughter noted her coughing and rattling in the chest. On arrival to the hospital. Patient's vitals revealed that she is mildly febrile with temperature of 99, hypoxic with O2 sats of 89% on room air, labs revealed hypokalemia, leukocytosis. ABGs were unremarkable. Urinalysis was unremarkable. Chest x-ray showed left base density, questionable recurrent pneumonia. Hospitalist services were contacted for admission.   PAST MEDICAL HISTORY:   Past Medical History:  Diagnosis Date  . Allergy   . Anxiety   . Arthritis   . Aspiration pneumonitis (HCC) 11/24/2015  . Asthma   . CHF (congestive heart failure) (HCC)   . Chronic pain   . COPD (chronic obstructive pulmonary disease) (HCC)   . Coronary artery disease   . DVT (deep venous thrombosis)  (HCC)   . GERD (gastroesophageal reflux disease)   . Headache   . Hyperlipidemia   . Hypertension   . Migraines   . Neuropathy (HCC) 2010  . Osteoporosis   . Oxygen deficiency   . Peripheral vascular disease (HCC)   . Pneumonia   . Pneumonia 11/19/2015  . Pneumonia 10/2016  . Vitamin D deficiency     PAST SURGICAL HISTORY: Past Surgical History:  Procedure Laterality Date  . ABDOMINAL HYSTERECTOMY  1975   Bilaterl Oophorectomy; Dur to IUD infection  . abdomnal aortic stent  05/30/2008   Dr. Nanetta Batty  . APPENDECTOMY    . APPENDECTOMY    . cardiac catherization  10/31/2009  . CERVICAL FUSION  C5 - 6/C6-7  . CHOLECYSTECTOMY  1972  . COLONOSCOPY WITH PROPOFOL N/A 07/27/2015   Procedure: COLONOSCOPY WITH PROPOFOL;  Surgeon: Wallace Cullens, MD;  Location: [facility] ENDOSCOPY;  Service: Gastroenterology;  Laterality: N/A;  . ESOPHAGOGASTRODUODENOSCOPY (EGD) WITH PROPOFOL N/A 07/27/2015   Procedure: ESOPHAGOGASTRODUODENOSCOPY (EGD) WITH PROPOFOL;  Surgeon: Wallace Cullens, MD;  Location: [facility] ENDOSCOPY;  Service: Gastroenterology;  Laterality: N/A;  . FOOT SURGERY Bilateral    5-6 years per patient  . SPINE SURGERY      SOCIAL HISTORY:  Social History  Substance Use Topics  . Smoking status: Current Every Day Smoker    Packs/day: 0.50    Years: 50.00    Types: Cigarettes  . Smokeless tobacco: Never Used     Comment: Previously smoked 2 ppd  . Alcohol use No    FAMILY HISTORY:  Family History  Problem Relation Age of Onset  . Cancer Mother   . Arthritis Mother   . Heart disease Mother   . Diabetes Mother     mellitus, type 2  . Heart disease Father   . Diabetes Sister   . Cancer Brother   . Cancer Brother     lung  . Diabetes Brother     DRUG ALLERGIES:  Allergies  Allergen Reactions  . Percocet [Oxycodone-Acetaminophen] Hives and Rash  . Aspirin Nausea And Vomiting and Other (See Comments)    Reaction:  GI upset   . Codeine Nausea And Vomiting and Other (See Comments)     Reaction:  GI upset   . Propoxyphene Other (See Comments) and Nausea Only    GI upset Reaction:  GI upset   . Sulfa Antibiotics Rash and Other (See Comments)    Reaction:  GI upset     Review of Systems  Unable to perform ROS: Mental acuity    MEDICATIONS AT HOME:  Prior to Admission medications   Medication Sig Start Date End Date Taking? Authorizing Provider  acetaminophen (TYLENOL) 500 MG tablet Take 1,000 mg by mouth every 6 (six) hours as needed for mild pain or headache.    Yes Historical Provider, MD  ADVAIR DISKUS 250-50 MCG/DOSE AEPB inhale 1 dose by mouth twice a day 08/15/16  Yes Malva Limes, MD  albuterol (PROVENTIL HFA;VENTOLIN HFA) 108 (90 BASE) MCG/ACT inhaler Inhale 2 puffs into the lungs every 4 (four) hours as needed for wheezing or shortness of breath. 08/11/15  Yes Malva Limes, MD  albuterol (PROVENTIL) (2.5 MG/3ML) 0.083% nebulizer solution Take 3 mLs (2.5 mg total) by nebulization every 4 (four) hours as needed for wheezing. 10/12/15  Yes Katharina Caper, MD  ALPRAZolam Prudy Feeler) 0.25 MG tablet Take 1 tablet (0.25 mg total) by mouth 4 (four) times daily as needed for anxiety. 11/07/16  Yes Delfino Lovett, MD  celecoxib (CELEBREX) 100 MG capsule Take 1 capsule (100 mg total) by mouth 2 (two) times daily as needed. 10/24/16  Yes Malva Limes, MD  clopidogrel (PLAVIX) 75 MG tablet Take 75 mg by mouth daily.   Yes Historical Provider, MD  cyclobenzaprine (FLEXERIL) 5 MG tablet take 1-2 tablets by mouth every 8 hours if needed 04/23/16  Yes Malva Limes, MD  diphenoxylate-atropine (LOMOTIL) 2.5-0.025 MG per tablet Take 2 tablets by mouth 4 (four) times daily as needed for diarrhea or loose stools. Reported on 03/11/2016   Yes Historical Provider, MD  donepezil (ARICEPT) 10 MG tablet Take 10 mg by mouth at bedtime.   Yes Historical Provider, MD  estropipate (OGEN) 0.75 MG tablet Take 1 tablet (0.75 mg total) by mouth every other day. 12/17/16  Yes Malva Limes, MD   fentaNYL (DURAGESIC - DOSED MCG/HR) 50 MCG/HR Place 1 patch (50 mcg total) onto the skin every 3 (three) days. 12/19/16 01/18/17 Yes Delano Metz, MD  fentaNYL (DURAGESIC - DOSED MCG/HR) 50 MCG/HR Place 1 patch (50 mcg total) onto the skin every 3 (three) days. 01/18/17 02/17/17 Yes Delano Metz, MD  fentaNYL (DURAGESIC - DOSED MCG/HR) 50 MCG/HR Place 1 patch (50 mcg total) onto the skin every 3 (three) days. 02/17/17 03/19/17 Yes Delano Metz, MD  fluticasone (FLONASE) 50 MCG/ACT nasal spray Place 2 sprays into both nostrils daily. 04/09/16  Yes Malva Limes, MD  furosemide (LASIX) 40 MG tablet Take 40 mg by mouth daily.   Yes Historical Provider, MD  mupirocin ointment (BACTROBAN) 2 %  Place 1 application into the nose 2 (two) times daily. 10/24/16  Yes Malva Limesonald E Fisher, MD  pantoprazole (PROTONIX) 40 MG tablet take 1 tablet by mouth twice a day 10/25/16  Yes Malva Limesonald E Fisher, MD  potassium chloride (K-DUR,KLOR-CON) 10 MEQ tablet Take 10 mEq by mouth daily. 10/29/16  Yes Historical Provider, MD  pregabalin (LYRICA) 150 MG capsule Take 1 capsule (150 mg total) by mouth 3 (three) times daily. 12/19/16 03/19/17 Yes Delano MetzFrancisco Naveira, MD  simvastatin (ZOCOR) 10 MG tablet Take 10 mg by mouth at bedtime. Reported on 12/04/2015   Yes Historical Provider, MD  SPIRIVA HANDIHALER 18 MCG inhalation capsule inhale the contents of one capsule in the handihaler once daily 11/16/16  Yes Malva Limesonald E Fisher, MD  SUMAtriptan (IMITREX) 25 MG tablet Take 25 mg by mouth as needed for migraine. May repeat in 2 hours if headache persists or recurs.   Yes Historical Provider, MD  traMADol (ULTRAM) 50 MG tablet Take 2 tablets (100 mg total) by mouth every 6 (six) hours as needed for moderate pain or severe pain. 12/19/16 03/19/17 Yes Delano MetzFrancisco Naveira, MD  triamcinolone ointment (KENALOG) 0.5 % Apply to lesions on hands twice a day as needed 10/24/16  Yes Malva Limesonald E Fisher, MD      PHYSICAL EXAMINATION:   VITAL SIGNS:  Blood pressure (!) 149/47, pulse 70, temperature 99 F (37.2 C), temperature source Rectal, resp. rate 20, height 5\' 1"  (1.549 m), weight 43.1 kg (95 lb), SpO2 (!) 89 %.  GENERAL:  66 y.o.-year-old patient lying in the bed with no acute distress, Somnolent, does not open eyes. Does not converse and estimated by pain, then she screams ouch and stop it, and does not engage in any conversation.  EYES: Pupils equal, round, reactive to light and accommodation. No scleral icterus. Extraocular muscles intact.  HEENT: Head atraumatic, normocephalic. Oropharynx and nasopharynx clear.  NECK:  Supple, no jugular venous distention. No thyroid enlargement, no tenderness.  LUNGS: Some diminished breath sounds bilaterally, no wheezing, right-sided scattered rales,rhonchi , but no crepitations, some diminished breath sounds on the left base. Intermittent use of accessory muscles of respiration.  CARDIOVASCULAR: S1, S2 normal. No murmurs, rubs, or gallops.  ABDOMEN: Soft, nontender, nondistended. Bowel sounds present. No organomegaly or mass.  EXTREMITIES: No pedal edema, cyanosis, or clubbing.  NEUROLOGIC: Cranial nerves II through XII are grossly intact. Muscle strength , difficult to examine, but withdraws all extremities. Sensation grossly intact. Gait not checked.  PSYCHIATRIC: The patient is very somnolent, not able to assess orientation, nonverbal, does not open eyes   SKIN: No obvious rash, lesion, or ulcer.   LABORATORY PANEL:   CBC  Recent Labs Lab 12/23/16 1030  WBC 11.3*  HGB 9.6*  HCT 28.4*  PLT 251  MCV 84.8  MCH 28.6  MCHC 33.8  RDW 17.4*  LYMPHSABS 0.5*  MONOABS 0.7  EOSABS 0.0  BASOSABS 0.0   ------------------------------------------------------------------------------------------------------------------  Chemistries   Recent Labs Lab 12/23/16 1030  NA 136  K 3.1*  CL 99*  CO2 28  GLUCOSE 77  BUN 17  CREATININE 0.84  CALCIUM 8.4*  AST 13*  ALT 7*  ALKPHOS 107   BILITOT 1.0   ------------------------------------------------------------------------------------------------------------------  Cardiac Enzymes No results for input(s): TROPONINI in the last 168 hours. ------------------------------------------------------------------------------------------------------------------  RADIOLOGY: Dg Chest Port 1 View  Result Date: 12/23/2016 CLINICAL DATA:  Acute presentation with weakness and shortness of breath. Recent pneumonia. EXAM: PORTABLE CHEST 1 VIEW COMPARISON:  11/05/2016 and multiple previous FINDINGS: Heart  size is normal. Mediastinal shadows are unremarkable except for aortic atherosclerosis. The right lung is clear. There is mild persistent or residual patchy infiltrate at the left base medially. The appearance is improved compared to December. No evidence heart failure or effusion. Bony structures are unremarkable. IMPRESSION: Clear except for mild patchy density at the medial left base. This is improved compared to the study of 11/05/2016, but could represent mild residual or recurrent pneumonia. Electronically Signed   By: Paulina Fusi M.D.   On: 12/23/2016 09:38    EKG: Orders placed or performed during the hospital encounter of 11/05/16  . ED EKG  . ED EKG  . EKG 12-Lead  . EKG 12-Lead  . EKG   [place] emergency room reveals sinus rhythm at 78 beats per minutes, left atrial enlargement, LVH with repolarization abnormalities, T depressions in inferior leads, prolonged QTC to 552 ms IMPRESSION AND PLAN:  Active Problems:   Metabolic encephalopathy   Hypokalemia   Pneumonia  #1. Toxic Metabolic encephalopathy, admit the patient to the medical floor, continue neurochecks, supportive therapy with IV fluids, speech therapist evaluation is requested, suspend all opiates, Flexeril, and other medications which may affect sensory function #2. Hypokalemia supplementing intravenously, get magnesium level and supplement if needed #3. Pneumonia,  recurrent, healthcare associated, continue broad-spectrum antibiotic therapy, get sputum cultures if possible, blood cultures are taken in the emergency room #4. Leukocytosis, follow with therapy  All the records are reviewed and case discussed with ED provider. Management plans discussed with the patient, family and they are in agreement.  CODE STATUS: Code Status History    Date Active Date Inactive Code Status Order ID Comments User Context   11/06/2016  9:00 AM 11/07/2016  6:53 PM Full Code [id number]  Delfino Lovett, MD Inpatient   11/05/2016  2:01 PM 11/06/2016  9:00 AM Partial Code [id number]  Adrian Saran, MD Inpatient   06/03/2016  2:53 AM 06/05/2016  7:45 PM Full Code [id number]  Gery Pray, MD Inpatient   11/19/2015 12:18 PM 11/24/2015  4:24 PM Full Code [id number]  Shaune Pollack, MD Inpatient   11/09/2015 11:02 PM 11/13/2015  6:51 PM Full Code [id number]  Wyatt Haste, MD ED   10/17/2015 10:26 PM 10/22/2015  1:03 PM Full Code [id number]  Enedina Finner, MD Inpatient   10/09/2015  5:38 PM 10/12/2015  7:34 PM Full Code [id number]  Shaune Pollack, MD Inpatient   08/01/2015 12:08 AM 08/03/2015  2:54 PM Full Code [id number]  Oralia Manis, MD Inpatient       TOTAL TIME TAKING CARE OF THIS PATIENT: 50 minutes.    Katharina Caper M.D on 12/23/2016 at 12:44 PM  Between 7am to 6pm - Pager - [phone] After 6pm go to [email or web address] - password [email or web address] [facility]  [place] [facility]  Office  [phone]  CC: Primary care physician; Mila Merry, MD

## 2016-12-23 NOTE — ED Notes (Addendum)
Patient refusing to swallow water from cup. PO meds held at this time. Breathing treatment and IV fluids given.

## 2016-12-23 NOTE — Progress Notes (Signed)
Pharmacy Antibiotic Note  Cheryl [name] is a 66 y.o. female admitted on 12/23/2016 with pneumonia.  Pharmacy has been consulted for vancomycin and cefepime dosing.  Plan: Cefepime 2 g IV q12h  Vancomycin 1000 mg dose given in ED. Will order vancomycin 750 mg IV daily (no stacked dosing since 23 mg/kg dose given in ED). Goal vancomycin trough 15-20 mcg/mL Vancomycin trough ordered for 2/1 at 1030  Kinetics: Actual body weight = 43 kg Ke: 0.041 Half-life: 17 hrs Vd: ~30 L Cmin (calculated) ~18 mcg/mL  MRSA PCR and PCT ordered.  Height: 5\' 1"  (154.9 cm) Weight: 95 lb (43.1 kg) IBW/kg (Calculated) : 47.8  Temp (24hrs), Avg:98.6 F (37 C), Min:98.1 F (36.7 C), Max:99 F (37.2 C)   Recent Labs Lab 12/23/16 0945 12/23/16 1030  WBC  --  11.3*  CREATININE  --  0.84  LATICACIDVEN 0.7  --     Estimated Creatinine Clearance: 44.2 mL/min (by C-G formula based on SCr of 0.84 mg/dL).    Allergies  Allergen Reactions  . Percocet [Oxycodone-Acetaminophen] Hives and Rash  . Aspirin Nausea And Vomiting and Other (See Comments)    Reaction:  GI upset   . Codeine Nausea And Vomiting and Other (See Comments)    Reaction:  GI upset   . Propoxyphene Other (See Comments) and Nausea Only    GI upset Reaction:  GI upset   . Sulfa Antibiotics Rash and Other (See Comments)    Reaction:  GI upset    Antimicrobials this admission: cefepime 1/29 >>  vancomycin 1/29 >>   Dose adjustments this admission:  Microbiology results: 1/29 BCx: Sent 1/29 UCx: Sent  1/29 MRSA PCR: Ordered  Thank you for allowing pharmacy to be a part of this patient's care.  Cindi CarbonMary M Dyana Magner, PharmD, BCPS Clinical Pharmacist 12/23/2016 1:41 PM

## 2016-12-23 NOTE — Telephone Encounter (Signed)
Pt does not get oxygen through [facility].Order faxed to [facility]

## 2016-12-24 LAB — CBC
HEMATOCRIT: 28.4 % — AB (ref 35.0–47.0)
Hemoglobin: 9.3 g/dL — ABNORMAL LOW (ref 12.0–16.0)
MCH: 28.1 pg (ref 26.0–34.0)
MCHC: 32.8 g/dL (ref 32.0–36.0)
MCV: 85.7 fL (ref 80.0–100.0)
PLATELETS: 264 10*3/uL (ref 150–440)
RBC: 3.31 MIL/uL — ABNORMAL LOW (ref 3.80–5.20)
RDW: 16.6 % — AB (ref 11.5–14.5)
WBC: 12.2 10*3/uL — ABNORMAL HIGH (ref 3.6–11.0)

## 2016-12-24 LAB — BASIC METABOLIC PANEL
Anion gap: 11 (ref 5–15)
BUN: 18 mg/dL (ref 6–20)
CHLORIDE: 100 mmol/L — AB (ref 101–111)
CO2: 26 mmol/L (ref 22–32)
CREATININE: 0.69 mg/dL (ref 0.44–1.00)
Calcium: 8.2 mg/dL — ABNORMAL LOW (ref 8.9–10.3)
GFR calc Af Amer: >60 mL/min (ref >60)
GFR calc non Af Amer: >60 mL/min (ref >60)
Glucose, Bld: 111 mg/dL — ABNORMAL HIGH (ref 65–99)
POTASSIUM: 3.1 mmol/L — AB (ref 3.5–5.1)
Sodium: 137 mmol/L (ref 135–145)

## 2016-12-24 LAB — URINE CULTURE: Culture: NO GROWTH

## 2016-12-24 LAB — GLUCOSE, CAPILLARY: Glucose-Capillary: 101 mg/dL — ABNORMAL HIGH (ref 65–99)

## 2016-12-24 MED ORDER — ENOXAPARIN SODIUM 30 MG/0.3ML ~~LOC~~ SOLN
30.0000 mg | SUBCUTANEOUS | Status: DC
  Administered 2016-12-24: 30 mg via SUBCUTANEOUS
  Filled 2016-12-24 (×2): qty 0.3

## 2016-12-24 MED ORDER — ORAL CARE MOUTH RINSE
15.0000 mL | Freq: Two times a day (BID) | OROMUCOSAL | Status: DC
  Administered 2016-12-24: 15 mL via OROMUCOSAL

## 2016-12-24 MED ORDER — DEXTROSE 5 % IV SOLN
2.0000 g | Freq: Two times a day (BID) | INTRAVENOUS | Status: DC
  Administered 2016-12-24 – 2016-12-25 (×3): 2 g via INTRAVENOUS
  Filled 2016-12-24 (×5): qty 2

## 2016-12-24 MED ORDER — ALBUTEROL SULFATE (2.5 MG/3ML) 0.083% IN NEBU
2.5000 mg | INHALATION_SOLUTION | Freq: Four times a day (QID) | RESPIRATORY_TRACT | Status: DC
  Administered 2016-12-24 (×2): 2.5 mg via RESPIRATORY_TRACT
  Filled 2016-12-24 (×2): qty 3

## 2016-12-24 NOTE — Evaluation (Signed)
Clinical/Bedside Swallow Evaluation Patient Details  Name: Cheryl [name] MRN: [id number] Date of Birth: 02/04/1951  Today's Date: 12/24/2016 Time: SLP Start Time (ACUTE ONLY): 4098 SLP Stop Time (ACUTE ONLY): 1025 SLP Time Calculation (min) (ACUTE ONLY): 60 min  Past Medical History:  Past Medical History:  Diagnosis Date  . Allergy   . Anxiety   . Arthritis   . Aspiration pneumonitis (HCC) 11/24/2015  . Asthma   . CHF (congestive heart failure) (HCC)   . Chronic pain   . COPD (chronic obstructive pulmonary disease) (HCC)   . Coronary artery disease   . DVT (deep venous thrombosis) (HCC)   . GERD (gastroesophageal reflux disease)   . Headache   . Hyperlipidemia   . Hypertension   . Migraines   . Neuropathy (HCC) 2010  . Osteoporosis   . Oxygen deficiency   . Peripheral vascular disease (HCC)   . Pneumonia   . Pneumonia 11/19/2015  . Pneumonia 10/2016  . Vitamin D deficiency    Past Surgical History:  Past Surgical History:  Procedure Laterality Date  . ABDOMINAL HYSTERECTOMY  1975   Bilaterl Oophorectomy; Dur to IUD infection  . abdomnal aortic stent  05/30/2008   Dr. Nanetta Batty  . APPENDECTOMY    . APPENDECTOMY    . cardiac catherization  10/31/2009  . CERVICAL FUSION  C5 - 6/C6-7  . CHOLECYSTECTOMY  1972  . COLONOSCOPY WITH PROPOFOL N/A 07/27/2015   Procedure: COLONOSCOPY WITH PROPOFOL;  Surgeon: Wallace Cullens, MD;  Location: [facility] ENDOSCOPY;  Service: Gastroenterology;  Laterality: N/A;  . ESOPHAGOGASTRODUODENOSCOPY (EGD) WITH PROPOFOL N/A 07/27/2015   Procedure: ESOPHAGOGASTRODUODENOSCOPY (EGD) WITH PROPOFOL;  Surgeon: Wallace Cullens, MD;  Location: [facility] ENDOSCOPY;  Service: Gastroenterology;  Laterality: N/A;  . FOOT SURGERY Bilateral    5-6 years per patient  . SPINE SURGERY     HPI:  Pt is a 65 y.o. female with a known history of multiple medical issues including Aspiration pneumonitis, dysphagia, recurrent pneumonia, congestive heart failure, COPD, GERD,  migraines, coronary artery disease, DVT, hypertension, hyperlipidemia, neuropathy, who presents to the hospital with complaints of poor responsiveness, altered mental status. I want to patient's daughter, who was present during my interview, patient was admitted from the toes to 14th of December 2017 for pneumonia to the hospital after which she was discharged to [facility] mutation facility. She is out from a rehabilitation facility for the past 2 weeks. However, over the past few days she's been noted to be progressively weaker. She was found on the floor yesterday in the morning. Over the past 24 hours she became progressively more somnolent is not talking, not eating, not taking her medications. Patient's daughter noted her coughing and rattling in the chest. On arrival to the hospital. Patient's vitals revealed that she is mildly febrile with temperature of 99, hypoxic with O2 sats of 89% on room air, labs revealed hypokalemia, leukocytosis. ABGs were unremarkable. Urinalysis was unremarkable. Chest x-ray showed left base density, questionable recurrent pneumonia.    Assessment / Plan / Recommendation Clinical Impression  Pt has a h/o oropharryngeal phase dysphagia per MBSS in 10/2015 which revealed "swallow delay resulted in deep laryngeal penetration to the level of the vocal cords with thin liquids. This penetration was silent and was not observed to clear by ST". Pt consumed the few po trials at this bedside evaluation w/ no overt s/s of aspiration noted - however, silent laryngeal penetration would not be noted at a bedside evaluation. No gross oral  phase deficits noted w/ the trials accepted; pt declined further. When ST services were explained further in regards to another objective swallow study(MBSS), pt immediately stated "NO!". When asked again if she would want to do a MBSS or drink thickened liquids again as recommended before, pt again stated "NO". Discussed strict aspiration precautions w/ pt  and Husband; followed up w/ MD/NSG and explained pt's risk for aspiration as well as prior dysphagia and denying need for further assessment and thickened liquids if recommended. ST services will f/u w/ toleration of diet and further education w/ pt/family.     Aspiration Risk  Mild aspiration risk;Moderate aspiration risk    Diet Recommendation  Dysphagia level 2 w/ thin liquids via CUP only; strict aspiration precautions. Strict monitoring of oral intake for any s/s of aspiration or decline in status. Tray setup at meals; encouragement. Dietician f/u for supplements.   Medication Administration: Whole meds with puree (Crush if necessary for easier, safer swallowing)    Other  Recommendations Recommended Consults:  (Dietician) Oral Care Recommendations: Oral care BID;Staff/trained caregiver to provide oral care Other Recommendations:  (TBD)   Follow up Recommendations  (TBD)      Frequency and Duration min 2x/week  1 week (TBD)       Prognosis Prognosis for Safe Diet Advancement: Guarded (-Fair) Barriers to Reach Goals:  (baseline Dysphagia)      Swallow Study   General Date of Onset: 12/23/16 HPI: Pt is a 66 y.o. female with a known history of multiple medical issues including Aspiration pneumonitis, dysphagia, recurrent pneumonia, congestive heart failure, COPD, GERD, migraines, coronary artery disease, DVT, hypertension, hyperlipidemia, neuropathy, who presents to the hospital with complaints of poor responsiveness, altered mental status. I want to patient's daughter, who was present during my interview, patient was admitted from the toes to 14th of December 2017 for pneumonia to the hospital after which she was discharged to [facility] mutation facility. She is out from a rehabilitation facility for the past 2 weeks. However, over the past few days she's been noted to be progressively weaker. She was found on the floor yesterday in the morning. Over the past 24 hours she became  progressively more somnolent is not talking, not eating, not taking her medications. Patient's daughter noted her coughing and rattling in the chest. On arrival to the hospital. Patient's vitals revealed that she is mildly febrile with temperature of 99, hypoxic with O2 sats of 89% on room air, labs revealed hypokalemia, leukocytosis. ABGs were unremarkable. Urinalysis was unremarkable. Chest x-ray showed left base density, questionable recurrent pneumonia.  Type of Study: Bedside Swallow Evaluation Previous Swallow Assessment: MBSS 10/2015 Diet Prior to this Study: Regular;Thin liquids (per Husband) Temperature Spikes Noted: No (wbc 12.2) Respiratory Status: Nasal cannula (2-3 liters) History of Recent Intubation: No Behavior/Cognition: Alert;Cooperative;Pleasant mood;Requires cueing Oral Cavity Assessment: Dry (dry lips) Oral Care Completed by SLP:  (pt declined) Oral Cavity - Dentition: Edentulous (has denture plates) Vision: Functional for self-feeding Self-Feeding Abilities: Able to feed self;Needs assist;Needs set up Patient Positioning: Upright in bed Baseline Vocal Quality: Low vocal intensity Volitional Cough:  (fair) Volitional Swallow: Able to elicit    Oral/Motor/Sensory Function Overall Oral Motor/Sensory Function: Within functional limits (w/ bolus management)   Ice Chips Ice chips: Within functional limits Presentation: Spoon (x2 trials)   Thin Liquid Thin Liquid: Within functional limits Presentation: Cup;Self Fed (6 trials sips - small) Other Comments: Pt did not exhibit OVERT s/s of aspiration, however, MBSS in 2016 revelaed SILENT laryngeal penetration  at that time.     Nectar Thick Nectar Thick Liquid: Not tested   Honey Thick Honey Thick Liquid: Not tested   Puree Puree: Within functional limits Presentation: Spoon;Self Fed (4 small (1/4) tsp amounts)   Solid   GO   Solid:  (declined)         Jerilynn SomKatherine Watson, MS, CCC-SLP Watson,Katherine 12/24/2016,4:33  PM

## 2016-12-24 NOTE — Progress Notes (Signed)
[facility] -  at [facility]   PATIENT NAME: Cheryl DimmerMary [name]    MR#:  [id number]  DATE OF BIRTH:  January 03, 1951  SUBJECTIVE:  CHIEF COMPLAINT:   Chief Complaint  Patient presents with  . Weakness   - admitted with recurrent aspiration pneumonia, was hypoxia on presentation currently requiring 3 L oxygen. -More alert at this time. Still has coughing. -Does not want to be on dysphagia diet.  REVIEW OF SYSTEMS:  Review of Systems  Constitutional: Positive for malaise/fatigue. Negative for chills and fever.  HENT: Negative for congestion, ear discharge, hearing loss and nosebleeds.   Eyes: Negative for blurred vision and double vision.  Respiratory: Positive for shortness of breath. Negative for cough and wheezing.   Cardiovascular: Negative for chest pain, palpitations and leg swelling.  Gastrointestinal: Negative for abdominal pain, constipation, diarrhea, nausea and vomiting.  Genitourinary: Negative for dysuria.  Musculoskeletal: Positive for myalgias.  Neurological: Negative for dizziness, speech change, focal weakness, seizures and headaches.  Psychiatric/Behavioral: Negative for depression.    DRUG ALLERGIES:   Allergies  Allergen Reactions  . Percocet [Oxycodone-Acetaminophen] Hives and Rash  . Aspirin Nausea And Vomiting and Other (See Comments)    Reaction:  GI upset   . Codeine Nausea And Vomiting and Other (See Comments)    Reaction:  GI upset   . Propoxyphene Other (See Comments) and Nausea Only    GI upset Reaction:  GI upset   . Sulfa Antibiotics Rash and Other (See Comments)    Reaction:  GI upset     VITALS:  Blood pressure (!) 131/52, pulse 88, temperature 97.7 F (36.5 C), temperature source Oral, resp. rate 20, height 5\' 1"  (1.549 m), weight 43 kg (94 lb 12.8 oz), SpO2 94 %.  PHYSICAL EXAMINATION:  Physical Exam  GENERAL:  66 y.o.-year-old thin built patient lying in the bed with no acute distress.  EYES: Pupils equal, round,  reactive to light and accommodation. No scleral icterus. Extraocular muscles intact.  HEENT: Head atraumatic, normocephalic. Oropharynx and nasopharynx clear.  NECK:  Supple, no jugular venous distention. No thyroid enlargement, no tenderness.  LUNGS: Normal breath sounds bilaterally, no wheezing, rales,rhonchi or crepitation. No use of accessory muscles of respiration. Decreased bibasilar breath sounds. CARDIOVASCULAR: S1, S2 normal. No murmurs, rubs, or gallops.  ABDOMEN: Soft, nontender, nondistended. Bowel sounds present. No organomegaly or mass.  EXTREMITIES: No pedal edema, cyanosis, or clubbing.  NEUROLOGIC: Cranial nerves II through XII are intact. Muscle strength 5/5 in all extremities. Sensation intact. Gait not checked. Global weakness noted PSYCHIATRIC: The patient is alert and oriented x 3.  SKIN: No obvious rash, lesion, or ulcer.    LABORATORY PANEL:   CBC  Recent Labs Lab 12/24/16 0501  WBC 12.2*  HGB 9.3*  HCT 28.4*  PLT 264   ------------------------------------------------------------------------------------------------------------------  Chemistries   Recent Labs Lab 12/23/16 1030  12/24/16 0501  NA 136  --  137  K 3.1*  --  3.1*  CL 99*  --  100*  CO2 28  --  26  GLUCOSE 77  --  111*  BUN 17  --  18  CREATININE 0.84  < > 0.69  CALCIUM 8.4*  --  8.2*  MG 1.9  --   --   AST 13*  --   --   ALT 7*  --   --   ALKPHOS 107  --   --   BILITOT 1.0  --   --   < > =  values in this interval not displayed. ------------------------------------------------------------------------------------------------------------------  Cardiac Enzymes No results for input(s): TROPONINI in the last 168 hours. ------------------------------------------------------------------------------------------------------------------  RADIOLOGY:  Dg Chest Port 1 View  Result Date: 12/23/2016 CLINICAL DATA:  Acute presentation with weakness and shortness of breath. Recent pneumonia.  EXAM: PORTABLE CHEST 1 VIEW COMPARISON:  11/05/2016 and multiple previous FINDINGS: Heart size is normal. Mediastinal shadows are unremarkable except for aortic atherosclerosis. The right lung is clear. There is mild persistent or residual patchy infiltrate at the left base medially. The appearance is improved compared to December. No evidence heart failure or effusion. Bony structures are unremarkable. IMPRESSION: Clear except for mild patchy density at the medial left base. This is improved compared to the study of 11/05/2016, but could represent mild residual or recurrent pneumonia. Electronically Signed   By: Paulina Fusi M.D.   On: 12/23/2016 09:38    EKG:   Orders placed or performed during the hospital encounter of 11/05/16  . ED EKG  . ED EKG  . EKG 12-Lead  . EKG 12-Lead  . EKG    ASSESSMENT AND PLAN:   66 year old female with past medical history significant for silent aspiration, history of aspiration pneumonias, coronary artery disease, hypertension, neuropathy, history of DVT and conge heart failure presents to hospital secondary to altered mental status and cough.  #1 acute metabolic encephalopathy-secondary to underlying aspiration pneumonia likely. -Mental status is much improved and close to baseline -Pain medications have been suspended  #2 recurrent aspiration pneumonia-chest x-ray showing clearing left base infiltrate -blood cultures are pending. Currently on vancomycin and cefepime -MRSA PCR is negative, discontinue vancomycin. -Appreciate speech therapy consult. Prior modified swallow study in 2016 showing silent aspiration. Patient will need to be on thickened liquid diet, however she is refusing at this time. -Discuss with husband tomorrow  #3 history of CVA-on Plavix and statin. No acute symptoms  #4GERD-on Protonix  #5 chronic back pain- old all her pain medications including fentanyl patch, Xanax and tramadol Watch for any withdrawals  #6 DVT  prophylaxis-on Lovenox  Physical therapy consulted.    All the records are reviewed and case discussed with Care Management/Social Workerr. Management plans discussed with the patient, family and they are in agreement.  CODE STATUS: full code  TOTAL TIME TAKING CARE OF THIS PATIENT: 37 minutes.   POSSIBLE D/C IN 1-2 DAYS, DEPENDING ON CLINICAL CONDITION.   Kiyana Vazguez M.D on 12/24/2016 at 3:57 PM  Between 7am to 6pm - Pager - [phone]  After 6pm go to [email or web address] - Social research officer, government  [facility]  Office  [phone]  CC: Primary care physician; Mila Merry, MD

## 2016-12-24 NOTE — Progress Notes (Signed)
Paged prime doc, waiting for response for bladder scan result

## 2016-12-24 NOTE — Progress Notes (Signed)
Paged Prime doc [phone] for bladder scan of 450ML.

## 2016-12-24 NOTE — Progress Notes (Signed)
OT Cancellation Note  Patient Details Name: Cheryl [name] MRN: [id number] DOB: 07-18-1951   Cancelled Treatment:    Reason Eval/Treat Not Completed: Fatigue/lethargy limiting ability to participate. Pt asleep upon OT arrival, pt's family member requested OT come back at later time as available to allow pt to sleep longer. Will re-attempt OT evaluation at later date/time as appropriate.  Eliezer BottomJamie L Stiller, OTR/L 12/24/2016, 9:40 AM

## 2016-12-24 NOTE — Clinical Social Work Placement (Addendum)
   CLINICAL SOCIAL WORK PLACEMENT  NOTE  Date:  12/24/2016  Patient Details  Name: Cheryl [name] MRN: [id number] Date of Birth: 07/15/1951  Clinical Social Work is seeking post-discharge placement for this patient at the Skilled  Nursing Facility level of care (*CSW will initial, date and re-position this form in  chart as items are completed):  Yes   Patient/family provided with [place] Clinical Social Work Department's list of facilities offering this level of care within the geographic area requested by the patient (or if unable, by the patient's family).  Yes   Patient/family informed of their freedom to choose among providers that offer the needed level of care, that participate in Medicare, Medicaid or managed care program needed by the patient, have an available bed and are willing to accept the patient.  Yes   Patient/family informed of [place]'s ownership interest in [facility] and [facility], as well as of the fact that they are under no obligation to receive care at these facilities.  PASRR submitted to EDS on       PASRR number received on       Existing PASRR number confirmed on 12/24/16     FL2 transmitted to all facilities within larger geographic area on 12/24/16     Patient informed that his/her managed care company has contracts with or will negotiate with certain facilities, including the following:            Patient/family informed of bed offers received.  Patient chooses bed at       Physician recommends and patient chooses bed at      Patient to be transferred to   on  .  Patient to be transferred to facility by       Patient family notified on   of transfer.  Name of family member notified:        PHYSICIAN       Additional Comment:    _______________________________________________ [name], Student-Social Work 12/24/2016, 11:54 AM

## 2016-12-24 NOTE — Evaluation (Signed)
Physical Therapy Evaluation Patient Details Name: Cheryl [name] MRN: [id number] DOB: [birth year] Today's Date: 12/24/2016   History of Present Illness  Pt. is a 66 y.o. female who was admitted to [facility] with Metabolic Encephalopathy, Pneumonia.  Pt's PMH includes COPD, DVT, neuropathy, osteoporosis, migraines, CHF, chronic pain, anxiety, cervical fusion.      Clinical Impression  Pt admitted with above diagnosis. Pt currently with functional limitations due to the deficits listed below (see PT Problem List). Mrs. Poulter presents lethargic but agreeable to therapy.  She required assist to complete therapeuitc exercises in supine and question amount of effort provided by pt.  She currently requires up to max assist for bed mobility and she is alone during the day at home while her husband is at work.  Given this information, recommending SNF at d/c.  Pt will benefit from skilled PT to increase their independence and safety with mobility to allow discharge to the venue listed below.      Follow Up Recommendations SNF    Equipment Recommendations  None recommended by PT    Recommendations for Other Services OT consult     Precautions / Restrictions Precautions Precautions: Fall;Other (comment) Precaution Comments: monitor O2, 2L O2 prn at baseline Restrictions Weight Bearing Restrictions: No      Mobility  Bed Mobility Overal bed mobility: Needs Assistance Bed Mobility: Supine to Sit;Sit to Supine     Supine to sit: Mod assist;HOB elevated Sit to supine: Max assist   General bed mobility comments: Assist for cues to use bed rail and assist managing LEs and elevating trunk.  Pt fatigues very quickly sitting EOB (tolerated ~15 seconds) and required max assist to return to supine.  Transfers                 General transfer comment: unable to attempt due to severe fatigue  Ambulation/Gait                Stairs            Wheelchair Mobility    Modified  Rankin (Stroke Patients Only)       Balance Overall balance assessment: Needs assistance;History of Falls Sitting-balance support: Single extremity supported;Feet supported Sitting balance-Leahy Scale: Poor Sitting balance - Comments: Pt must have at least 1 UE supported while sitting EOB Postural control: Posterior lean                                   Pertinent Vitals/Pain Pain Assessment: 0-10 Pain Score: 5  Pain Location: L calf cramping and chronic low back pain Pain Descriptors / Indicators: Aching;Cramping;Grimacing;Moaning Pain Intervention(s): Limited activity within patient's tolerance;Monitored during session;Repositioned (repositioned properly higher in bed at end of session)    Home Living Family/patient expects to be discharged to:: Private residence Living Arrangements: Spouse/significant other Available Help at Discharge: Family;Available PRN/intermittently (husband works during the day) Type of Home: House Home Access: Ramped entrance     Home Layout: One level Home Equipment: Environmental consultant - 4 wheels;Cane - single point;Bedside commode;Grab bars - toilet;Shower seat      Prior Function Level of Independence: Needs assistance   Gait / Transfers Assistance Needed: Ind with household ambulation using rollator.  Only leaves house to visit family 1x/wk using rollator and taking breaks to rest due to fatigue.  Pt has had several falls over the past 6 months.  ADL's / Homemaking Assistance Needed: Husband assists with getting  in/out of shower, but able to bathe indep once in shower (on seat).  Husband assisting with dressing.    Comments: Pt. has had multiple falls over past 6 months.     Hand Dominance   Dominant Hand: Right    Extremity/Trunk Assessment   Upper Extremity Assessment Upper Extremity Assessment: Generalized weakness    Lower Extremity Assessment Lower Extremity Assessment: RLE deficits/detail;LLE deficits/detail RLE Deficits /  Details: Strength grossly 2+/5 as pt unable to perform SLR without assist.  Although, question pt's effort. LLE Deficits / Details: Strength grossly 2+/5 as pt unable to perform SLR without assist.  Although, question pt's effort.    Cervical / Trunk Assessment Cervical / Trunk Assessment: Kyphotic  Communication   Communication: No difficulties  Cognition Arousal/Alertness: Lethargic Behavior During Therapy: Flat affect Overall Cognitive Status: Within Functional Limits for tasks assessed                      General Comments General comments (skin integrity, edema, etc.): SpO2 ranges from 88-90% on 3L via [place] throughout session.  Question amount of effort provided by therapist during evaluation.  At end of the session pt asking this PT to hold her cup while she drinks out of the straw.  Provided pt with cup but pt insructed to hold it while drinking.    Exercises General Exercises - Upper Extremity Shoulder Flexion: AROM;Both;Other reps (comment);Supine;Prone (after 3 reps pt refused to continue due to fatigue) General Exercises - Lower Extremity Ankle Circles/Pumps: AROM;Both;10 reps;Supine Quad Sets: Strengthening;Both;10 reps;Supine Gluteal Sets: Strengthening;Both;10 reps;Supine Hip ABduction/ADduction: AAROM;Both;10 reps;Supine Straight Leg Raises: AAROM;Both;10 reps;Supine   Assessment/Plan    PT Assessment Patient needs continued PT services  PT Problem List Decreased strength;Decreased activity tolerance;Decreased balance;Decreased mobility;Decreased knowledge of use of DME;Decreased safety awareness;Cardiopulmonary status limiting activity;Pain          PT Treatment Interventions DME instruction;Gait training;Functional mobility training;Therapeutic activities;Therapeutic exercise;Balance training;Neuromuscular re-education;Patient/family education;Other (comment) (Energy conservation techniques)    PT Goals (Current goals can be found in the Care Plan section)   Acute Rehab PT Goals Patient Stated Goal: none stated PT Goal Formulation: With patient/family Time For Goal Achievement: 01/07/17 Potential to Achieve Goals: Fair    Frequency Min 2X/week   Barriers to discharge Decreased caregiver support Pt alone at home during the day    Co-evaluation               End of Session Equipment Utilized During Treatment: Oxygen Activity Tolerance: Patient limited by fatigue Patient left: in bed;with call bell/phone within reach;with bed alarm set;with family/visitor present Nurse Communication: Mobility status         Time: [id number] PT Time Calculation (min) (ACUTE ONLY): 23 min   Charges:   PT Evaluation $PT Eval Low Complexity: 1 Procedure PT Treatments $Therapeutic Exercise: 8-22 mins   PT G Codes:        Encarnacion ChuAshley Mariem Skolnick PT, DPT 12/24/2016, 11:47 AM

## 2016-12-24 NOTE — Clinical Social Work Note (Signed)
Clinical Social Work Assessment  Patient Details  Name: Cheryl [name] MRN: [id number] Date of Birth: Jun 19, 1951  Date of referral:  12/24/16               Reason for consult:  Facility Placement, Discharge Planning                Permission sought to share information with:  Chartered certified accountant granted to share information::  Yes, Verbal Permission Granted  Name::      [place]::   [place]   Relationship::     Contact Information:     Housing/Transportation Living arrangements for the past 2 months:  [place] of Information:  Patient, Spouse Patient Interpreter Needed:  None Criminal Activity/Legal Involvement Pertinent to Current Situation/Hospitalization:  No - Comment as needed Significant Relationships:  Adult Children Lives with:  Spouse Do you feel safe going back to the place where you live?  Yes Need for family participation in patient care:  No (Coment)  Care giving concerns:  Patient live at home in [place] with her husband Cheryl [name].    Social Worker assessment / plan:  Social work Theatre manager received social work consult. PT has not worked with patient at this time. Social work Theatre manager introduced herself and explained role of social work department. Social work Theatre manager met with patient at bedside. Patient was alert and oriented.  Per patient, she lives at home with her husband in [place]. Patient has 6 children that all live in the area. Patient's HPOA is her husband Cheryl [name]. Social work Theatre manager explained to patient that PT will work with patient and determine if patient needs to go to a SNF for short-term rehab or can go home and receive home health. Patient is preferring to go to to SNF. Patient and patient's husband both have a preference of [facility]. Social work Theatre manager explained that PT will have to make a decision on whether she needs SNF placement or not. Patient verbally agreed she understood. Social  work Theatre manager will continue to assist and follow as needed.   Fl2 completed and faxed out.   Employment status:  Unemployed Nurse, adult PT Recommendations:  Not assessed at this time Information / Referral to community resources:  [place]  Patient/Family's Response to care:  Patient and patient's husband are agreeable to SNF search. Patient and patient's husband have a preference of [facility] SN[facility].   Patient/Family's Understanding of and Emotional Response to Diagnosis, Current Treatment, and Prognosis:  Patient was pleasant and thanked social work Theatre manager for coming by. Patient's husband was pleasant and thanked social work Armed forces training and education officer.   Emotional Assessment Appearance:  Appears stated age Attitude/Demeanor/Rapport:    Affect (typically observed):  Accepting, Adaptable, Appropriate Orientation:  Oriented to Self, Oriented to Place, Oriented to  Time, Oriented to Situation Alcohol / Substance use:  Not Applicable Psych involvement (Current and /or in the community):  No (Comment)  Discharge Needs  Concerns to be addressed:  Basic Needs Readmission within the last 30 days:  No Current discharge risk:  Chronically ill Barriers to Discharge:  Continued Medical Work up   [employer], [place] Work 12/24/2016, 11:45 AM

## 2016-12-24 NOTE — NC FL2 (Signed)
[place] MEDICAID FL2 LEVEL OF CARE SCREENING TOOL     IDENTIFICATION  Patient Name: Cheryl [name] Birthdate: 12-Sep-1951 Sex: female Admission Date (Current Location): 12/23/2016  [facility] and [place] Number:  [address]  ([id number] [facility]) Facility and Address:  [facility], [address], [place], [place] [address]      Provider Number: [id number]  Attending Physician Name and Address:  Enid Baas, MD  Relative Name and Phone Number:       Current Level of Care: Hospital Recommended Level of Care: Skilled Nursing Facility Prior Approval Number:    Date Approved/Denied:   PASRR Number:  ([id number])  Discharge Plan: SNF [facility])    Current Diagnoses: Patient Active Problem List   Diagnosis Date Noted  . Metabolic encephalopathy 12/23/2016  . Hypokalemia 12/23/2016  . Pneumonia 12/23/2016  . Protein-calorie malnutrition, severe 11/06/2016  . Community acquired pneumonia 11/05/2016  . Nausea with vomiting   . Lumbar facet syndrome (Location of Primary Source of Pain) (Bilateral) (L>R) 03/20/2016  . Lumbar spondylosis 03/20/2016  . Opiate use (160 MME/Day) 02/28/2016  . Anemia 02/28/2016  . Osteoarthrosis 12/18/2015  . Long term current use of anticoagulant therapy (Plavix) 12/18/2015  . Long term current use of opiate analgesic 12/04/2015  . Fibromyalgia 12/04/2015  . Dysphagia 11/24/2015  . Acute diastolic CHF (congestive heart failure) (HCC) 11/24/2015  . Leukocytosis 11/24/2015  . Sepsis (HCC) 10/17/2015  . Abnormal mammogram of right breast 10/11/2015  . Dilated intrahepatic bile duct 10/11/2015  . Lumbar radicular pain (B) (L>R) (L4) 09/04/2015  . Chronic bilateral low back pain without sciatica 09/04/2015  . Encounter for therapeutic drug level monitoring 09/04/2015  . Uncomplicated opioid dependence (HCC) 09/04/2015  . Chronic pain syndrome 09/04/2015  . Platelet inhibition due to Plavix 09/04/2015  . COPD  (chronic obstructive pulmonary disease) (HCC) 07/31/2015  . Dementia 07/31/2015  . Airway hyperreactivity 07/03/2015  . Back pain, thoracic 07/03/2015  . Excessive falling 07/03/2015  . Alteration in bowel elimination: incontinence 07/03/2015  . Insomnia 07/03/2015  . Decreased testosterone level 07/03/2015  . Leg weakness 07/03/2015  . Menopausal symptom 07/03/2015  . Migraine 07/03/2015  . Neuropathy (HCC) 07/03/2015  . Fecal occult blood test positive 07/03/2015  . OP (osteoporosis) 07/03/2015  . Panic disorder 07/03/2015  . Compulsive tobacco user syndrome 07/03/2015  . Urinary incontinence 07/03/2015  . Weight loss 07/03/2015  . Essential hypertension 06/06/2015  . GERD (gastroesophageal reflux disease) 06/06/2015  . Hyperlipemia 06/06/2015  . 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 Anxiety 02/10/2014  . Coronary artery disease 02/10/2014  . Hypercholesteremia 02/10/2014  . Peripheral vascular disease (HCC) 02/10/2014  . Vitamin D deficiency 10/16/2009    Orientation RESPIRATION BLADDER Height & Weight     Situation, Place  O2 (Nasal Cannula) Continent Weight: 94 lb 12.8 oz (43 kg) Height:  5\' 1"  (154.9 cm)  BEHAVIORAL SYMPTOMS/MOOD NEUROLOGICAL BOWEL NUTRITION STATUS   (None. )  (None. ) Continent Diet (Diet: DYS 2)  AMBULATORY STATUS COMMUNICATION OF NEEDS Skin   Limited Assist Verbally Normal                       Personal Care Assistance Level of Assistance  Bathing, Dressing, Feeding Bathing Assistance: Limited assistance Feeding assistance: Independent Dressing Assistance: Limited assistance     Functional Limitations Info  Sight, Hearing, Speech Sight Info: Adequate Hearing Info: Adequate Speech Info: Impaired    SPECIAL CARE FACTORS FREQUENCY  PT (By licensed PT), OT (By  licensed OT)     PT Frequency:  (5) OT Frequency:  (5)            Contractures      Additional Factors Info  Code Status, Allergies Code Status Info:  (Full  Code) Allergies Info:  (Percocet Oxycodone-acetaminophen, Aspirin, Codeine, Propoxyphene, Sulfa Antibiotics)           Current Medications (12/24/2016):  This is the current hospital active medication list Current Facility-Administered Medications  Medication Dose Route Frequency Provider Last Rate Last Dose  . 0.9 % NaCl with KCl 20 mEq/ L  infusion   Intravenous Continuous Katharina Caperima Vaickute, MD 50 mL/hr at 12/23/16 1815    . acetaminophen (TYLENOL) tablet 650 mg  650 mg Oral Q6H PRN Katharina Caperima Vaickute, MD       Or  . acetaminophen (TYLENOL) suppository 650 mg  650 mg Rectal Q6H PRN Katharina Caperima Vaickute, MD      . acetaminophen (TYLENOL) tablet 1,000 mg  1,000 mg Oral Q6H PRN Katharina Caperima Vaickute, MD   1,000 mg at 12/23/16 2239  . albuterol (PROVENTIL) (2.5 MG/3ML) 0.083% nebulizer solution 2.5 mg  2.5 mg Nebulization Q4H Katharina Caperima Vaickute, MD   2.5 mg at 12/24/16 0759  . budesonide (PULMICORT) nebulizer solution 0.25 mg  0.25 mg Nebulization BID Katharina Caperima Vaickute, MD   0.25 mg at 12/24/16 0759  . ceFEPIme (MAXIPIME) 2 g in dextrose 5 % 50 mL IVPB  2 g Intravenous Q12H Enid Baasadhika Kalisetti, MD   2 g at 12/24/16 0958  . clopidogrel (PLAVIX) tablet 75 mg  75 mg Oral Daily Katharina Caperima Vaickute, MD   75 mg at 12/24/16 1001  . donepezil (ARICEPT) tablet 10 mg  10 mg Oral QHS Katharina Caperima Vaickute, MD   10 mg at 12/23/16 2239  . enoxaparin (LOVENOX) injection 30 mg  30 mg Subcutaneous Q24H Enid Baasadhika Kalisetti, MD      . estropipate (OGEN) tablet 0.75 mg  0.75 mg Oral QODAY Katharina Caperima Vaickute, MD      . fluticasone (FLONASE) 50 MCG/ACT nasal spray 2 spray  2 spray Each Nare Daily Katharina Caperima Vaickute, MD   2 spray at 12/24/16 0958  . ondansetron (ZOFRAN) tablet 4 mg  4 mg Oral Q6H PRN Katharina Caperima Vaickute, MD       Or  . ondansetron (ZOFRAN) injection 4 mg  4 mg Intravenous Q6H PRN Katharina Caperima Vaickute, MD      . pantoprazole (PROTONIX) EC tablet 40 mg  40 mg Oral BID Katharina Caperima Vaickute, MD   40 mg at 12/24/16 1000  . simvastatin (ZOCOR) tablet 10 mg  10 mg Oral QHS Katharina Caperima  Vaickute, MD   10 mg at 12/23/16 2239  . sodium chloride flush (NS) 0.9 % injection 3 mL  3 mL Intravenous Q12H Katharina Caperima Vaickute, MD      . tiotropium (SPIRIVA) inhalation capsule 18 mcg  18 mcg Inhalation Daily Katharina Caperima Vaickute, MD   18 mcg at 12/24/16 0900  . vancomycin (VANCOCIN) IVPB 750 mg/150 ml premix  750 mg Intravenous Q24H Cindi CarbonMary M Swayne, [facility] HospitalRPH         Discharge Medications: Please see discharge summary for a list of discharge medications.  Relevant Imaging Results:  Relevant Lab Results:   Additional Information  (SNF: [id number])  Ralene BatheMackenzie Dryden Tapley, Student-Social Work

## 2016-12-24 NOTE — Evaluation (Signed)
Occupational Therapy Evaluation Patient Details Name: Cheryl [name] MRN: [id number] DOB: 1951/08/22 Today's Date: 12/24/2016    History of Present Illness Pt. is a 66 y.o. female who was admitted to [facility] with Metabolic Encephalopathy, Pneumonia.  Pt's PMH includes COPD, DVT, neuropathy, osteoporosis, migraines, CHF, chronic pain, anxiety, cervical fusion.     Clinical Impression   Pt. Is a 66 y.o. Female who was admitted to [facility] with Metabolic Encephalopathy, and Pneumonia. Pt. Presents with weakness, decreased activity tolerance, and decreased functional mobility which hinder her ability to complete ADL functioning. Pt. Could benefit from skilled OT services for ADL training, A/E training, UE there. Ex, there. Activity, energy conservation, and pt. education about home modification, and DME.  Pt. Could benefit from SNF level of care with OT follow-up services.    Follow Up Recommendations  SNF    Equipment Recommendations       Recommendations for Other Services       Precautions / Restrictions Precautions Precautions: Fall;Other (comment) Precaution Comments: monitor O2, 2L O2 prn at baseline Restrictions Weight Bearing Restrictions: No         Balance Overall balance assessment: Needs assistance;History of Falls Sitting-balance support: Single extremity supported;Feet supported Sitting balance-Leahy Scale: Poor Sitting balance - Comments: Pt must have at least 1 UE supported while sitting EOB Postural control: Posterior lean                                  ADL Overall ADL's : Needs assistance/impaired     Grooming: Moderate assistance               Lower Body Dressing: Maximal assistance                 General ADL Comments: Pt. requires hand-over hand assist secondary to weakness in UEs. Pt. education wasprovided about positioing, UE functioning, and general reacher use.     Vision     Perception     Praxis       Pertinent Vitals/Pain Pain Assessment: 0-10 Pain Score: 5  Pain Location: L calf cramping and chronic low back pain Pain Descriptors / Indicators: Aching;Cramping;Grimacing;Moaning Pain Intervention(s): Limited activity within patient's tolerance;Monitored during session;Repositioned (repositioned properly higher in bed at end of session)     Hand Dominance Right   Extremity/Trunk Assessment Upper Extremity Assessment Upper Extremity Assessment: Generalized weakness       Communication Communication Communication: No difficulties   Cognition Arousal/Alertness: Lethargic Behavior During Therapy: Flat affect Overall Cognitive Status: Within Functional Limits for tasks assessed                     General Comments       Exercises     Shoulder Instructions      Home Living Family/patient expects to be discharged to:: Private residence Living Arrangements: Spouse/significant other Available Help at Discharge: Family;Available PRN/intermittently (husband works during the day) Type of Home: House Home Access: Ramped entrance     Home Layout: One level     Bathroom Shower/Tub: Producer, television/film/video: Handicapped height Bathroom Accessibility: Yes   Home Equipment: Environmental consultant - 4 wheels;Cane - single point;Bedside commode;Grab bars - toilet;Shower seat          Prior Functioning/Environment Comments: Pt. has had multiple falls over past 6 months.        OT Problem List: Decreased strength;Decreased activity tolerance;Decreased knowledge of  use of DME or AE;Pain   OT Treatment/Interventions: Self-care/ADL training;DME and/or AE instruction;Therapeutic activities;Therapeutic exercise;Patient/family education;Energy conservation    OT Goals(Current goals can be found in the care plan section) Acute Rehab OT Goals Patient Stated Goal: none stated  OT Frequency: Min 1X/week   Barriers to D/C:            Co-evaluation              End  of Session    Activity Tolerance: Patient tolerated treatment well Patient left: In bed with alarm in place, and call bell within reach.   Time: 1115-1130 OT Time Calculation (min): 15 min Charges:  OT General Charges $OT Visit: 1 Procedure OT Evaluation $OT Eval Moderate Complexity: 1 Procedure G-Codes:    Olegario MessierElaine Misha Antonini, MS, OTR/L 12/24/2016, 11:48 AM

## 2016-12-24 NOTE — Progress Notes (Signed)
Initial Nutrition Assessment  DOCUMENTATION CODES:   Severe malnutrition in context of chronic illness  INTERVENTION:  1. Magic cup TID with meals, each supplement provides 290 kcal and 9 grams of protein 2. NDD2-thin per SLP  NUTRITION DIAGNOSIS:   Malnutrition related to chronic illness as evidenced by severe depletion of muscle mass, severe depletion of body fat.  GOAL:   Patient will meet greater than or equal to 90% of their needs  MONITOR:   PO intake, I & O's, Labs, Weight trends, Supplement acceptance  REASON FOR ASSESSMENT:   Malnutrition Screening Tool    ASSESSMENT:   Cheryl [name]  is a 66 y.o. female with a known history of Aspiration pneumonitis, dysphagia, congestive heart failure, coronary artery disease, DVT, hypertension, hyperlipidemia, neuropathy, who presents to the hospital with complaints of poor responsiveness, altered mental status  Attempted to speak with patient at bedside but she still displays some confusion. Per chart she exhibits a 4#/4% severe wt loss over 2 weeks. Nutrition-Focused physical exam completed. Findings are severe fat depletion, severe muscle depletion, and no edema.  No PO intake documented thus far. She reports eating sherbert this morning, nothing more. States at home she normally eats 1 meal per day along with fruit throughout the day - likely not meeting needs if this is the case. Labs and medications reviewed: K 3.1 NS w/ KCL 20mEq @ 10050mL/hr  Diet Order:  DIET DYS 2 Room service appropriate? Yes with Assist; Fluid consistency: Thin  Skin:  Reviewed, no issues  Last BM:  12/23/2016  Height:   Ht Readings from Last 1 Encounters:  12/23/16 5\' 1"  (1.549 m)    Weight:   Wt Readings from Last 1 Encounters:  12/24/16 94 lb 12.8 oz (43 kg)    Ideal Body Weight:  47.72 kg  BMI:  Body mass index is 17.91 kg/m.  Estimated Nutritional Needs:   Kcal:  [id number] (MSJ x1.2-1.4)  Protein:  43-51 gm  Fluid:  >/=  1.1L  EDUCATION NEEDS:   No education needs identified at this time  Dionne AnoWilliam M. Chloey Ricard, MS, RD LDN Inpatient Clinical Dietitian Pager [phone]

## 2016-12-24 NOTE — Progress Notes (Signed)
Clinical Child psychotherapistocial Worker (CSW) presented bed offers to patient and she chose [facility]. [facility] and has agreed to [facility] Per Judeth CornfieldStephanie [facility]. CSW will continue to follow and assist as needed.   Baker Hughes IncorporatedBailey Bohdan Macho, LCSW [phone]

## 2016-12-24 NOTE — Progress Notes (Signed)
Pharmacist - Prescriber Communication  Enoxaparin dose modified to 30 mg subcutaneously once daily due to body weight less than 45 kg.   Cheryl [name] A. Althaookson, VermontPharm.D., BCPS Clinical Pharmacist 12/24/2016 0107

## 2016-12-25 ENCOUNTER — Telehealth: Payer: Self-pay | Admitting: Family Medicine

## 2016-12-25 DIAGNOSIS — G92 Toxic encephalopathy: Secondary | ICD-10-CM | POA: Diagnosis not present

## 2016-12-25 DIAGNOSIS — J189 Pneumonia, unspecified organism: Secondary | ICD-10-CM | POA: Diagnosis not present

## 2016-12-25 DIAGNOSIS — J45909 Unspecified asthma, uncomplicated: Secondary | ICD-10-CM | POA: Diagnosis not present

## 2016-12-25 DIAGNOSIS — I110 Hypertensive heart disease with heart failure: Secondary | ICD-10-CM | POA: Diagnosis not present

## 2016-12-25 DIAGNOSIS — I5031 Acute diastolic (congestive) heart failure: Secondary | ICD-10-CM | POA: Diagnosis not present

## 2016-12-25 DIAGNOSIS — R2681 Unsteadiness on feet: Secondary | ICD-10-CM | POA: Diagnosis not present

## 2016-12-25 DIAGNOSIS — M549 Dorsalgia, unspecified: Secondary | ICD-10-CM | POA: Diagnosis not present

## 2016-12-25 DIAGNOSIS — E785 Hyperlipidemia, unspecified: Secondary | ICD-10-CM | POA: Diagnosis not present

## 2016-12-25 DIAGNOSIS — I10 Essential (primary) hypertension: Secondary | ICD-10-CM | POA: Diagnosis not present

## 2016-12-25 DIAGNOSIS — Z79899 Other long term (current) drug therapy: Secondary | ICD-10-CM | POA: Diagnosis not present

## 2016-12-25 DIAGNOSIS — F411 Generalized anxiety disorder: Secondary | ICD-10-CM | POA: Diagnosis not present

## 2016-12-25 DIAGNOSIS — F0280 Dementia in other diseases classified elsewhere without behavioral disturbance: Secondary | ICD-10-CM | POA: Diagnosis not present

## 2016-12-25 DIAGNOSIS — J9811 Atelectasis: Secondary | ICD-10-CM | POA: Diagnosis not present

## 2016-12-25 DIAGNOSIS — I2510 Atherosclerotic heart disease of native coronary artery without angina pectoris: Secondary | ICD-10-CM | POA: Diagnosis not present

## 2016-12-25 DIAGNOSIS — R262 Difficulty in walking, not elsewhere classified: Secondary | ICD-10-CM | POA: Diagnosis not present

## 2016-12-25 DIAGNOSIS — F17210 Nicotine dependence, cigarettes, uncomplicated: Secondary | ICD-10-CM | POA: Diagnosis not present

## 2016-12-25 DIAGNOSIS — J441 Chronic obstructive pulmonary disease with (acute) exacerbation: Secondary | ICD-10-CM | POA: Diagnosis not present

## 2016-12-25 DIAGNOSIS — G9341 Metabolic encephalopathy: Secondary | ICD-10-CM | POA: Diagnosis not present

## 2016-12-25 DIAGNOSIS — J690 Pneumonitis due to inhalation of food and vomit: Secondary | ICD-10-CM | POA: Diagnosis not present

## 2016-12-25 DIAGNOSIS — Z741 Need for assistance with personal care: Secondary | ICD-10-CM | POA: Diagnosis not present

## 2016-12-25 DIAGNOSIS — R509 Fever, unspecified: Secondary | ICD-10-CM | POA: Diagnosis not present

## 2016-12-25 DIAGNOSIS — G894 Chronic pain syndrome: Secondary | ICD-10-CM | POA: Diagnosis not present

## 2016-12-25 DIAGNOSIS — F17218 Nicotine dependence, cigarettes, with other nicotine-induced disorders: Secondary | ICD-10-CM | POA: Diagnosis not present

## 2016-12-25 DIAGNOSIS — J449 Chronic obstructive pulmonary disease, unspecified: Secondary | ICD-10-CM | POA: Diagnosis not present

## 2016-12-25 DIAGNOSIS — M6281 Muscle weakness (generalized): Secondary | ICD-10-CM | POA: Diagnosis not present

## 2016-12-25 DIAGNOSIS — J181 Lobar pneumonia, unspecified organism: Secondary | ICD-10-CM | POA: Diagnosis not present

## 2016-12-25 DIAGNOSIS — R0902 Hypoxemia: Secondary | ICD-10-CM | POA: Diagnosis not present

## 2016-12-25 DIAGNOSIS — R1312 Dysphagia, oropharyngeal phase: Secondary | ICD-10-CM | POA: Diagnosis not present

## 2016-12-25 LAB — GLUCOSE, CAPILLARY: GLUCOSE-CAPILLARY: 95 mg/dL (ref 65–99)

## 2016-12-25 LAB — CBC
HCT: 28.2 % — ABNORMAL LOW (ref 35.0–47.0)
Hemoglobin: 9.3 g/dL — ABNORMAL LOW (ref 12.0–16.0)
MCH: 28.5 pg (ref 26.0–34.0)
MCHC: 33.2 g/dL (ref 32.0–36.0)
MCV: 86 fL (ref 80.0–100.0)
Platelets: 284 10*3/uL (ref 150–440)
RBC: 3.27 MIL/uL — ABNORMAL LOW (ref 3.80–5.20)
RDW: 17.3 % — AB (ref 11.5–14.5)
WBC: 10.2 10*3/uL (ref 3.6–11.0)

## 2016-12-25 LAB — BASIC METABOLIC PANEL
ANION GAP: 8 (ref 5–15)
BUN: 13 mg/dL (ref 6–20)
CALCIUM: 8.5 mg/dL — AB (ref 8.9–10.3)
CO2: 26 mmol/L (ref 22–32)
Chloride: 105 mmol/L (ref 101–111)
Creatinine, Ser: 0.59 mg/dL (ref 0.44–1.00)
GFR calc Af Amer: >60 mL/min (ref >60)
GLUCOSE: 101 mg/dL — AB (ref 65–99)
POTASSIUM: 3.5 mmol/L (ref 3.5–5.1)
SODIUM: 139 mmol/L (ref 135–145)

## 2016-12-25 LAB — PROCALCITONIN

## 2016-12-25 MED ORDER — GUAIFENESIN ER 600 MG PO TB12: 600.0000 mg | ORAL_TABLET | Freq: Two times a day (BID) | ORAL | 0 refills | Status: DC

## 2016-12-25 MED ORDER — ALBUTEROL SULFATE (2.5 MG/3ML) 0.083% IN NEBU
2.5000 mg | INHALATION_SOLUTION | Freq: Three times a day (TID) | RESPIRATORY_TRACT | Status: DC
  Administered 2016-12-25 (×2): 2.5 mg via RESPIRATORY_TRACT
  Filled 2016-12-25 (×2): qty 3

## 2016-12-25 MED ORDER — AMOXICILLIN-POT CLAVULANATE 875-125 MG PO TABS: 1.0000 | ORAL_TABLET | Freq: Two times a day (BID) | ORAL | 0 refills | Status: DC

## 2016-12-25 MED ORDER — ACETAMINOPHEN 325 MG PO TABS: 650.0000 mg | ORAL_TABLET | Freq: Four times a day (QID) | ORAL | 0 refills | Status: DC | PRN

## 2016-12-25 MED ORDER — ALPRAZOLAM 0.25 MG PO TABS: 0.2500 mg | ORAL_TABLET | Freq: Three times a day (TID) | ORAL | 0 refills | Status: DC | PRN

## 2016-12-25 MED ORDER — PREGABALIN 75 MG PO CAPS: 75.0000 mg | ORAL_CAPSULE | Freq: Three times a day (TID) | ORAL | 0 refills | Status: DC

## 2016-12-25 NOTE — Discharge Summary (Signed)
[facility] [employer] [facility]   PATIENT NAME: Cheryl [name]    MR#:  [id number]  DATE OF BIRTH:  09/16/1951  DATE OF ADMISSION:  12/23/2016   ADMITTING PHYSICIAN: Katharina Caper, MD  DATE OF DISCHARGE: 12/25/16  PRIMARY CARE PHYSICIAN: Mila Merry, MD   ADMISSION DIAGNOSIS:   Recurrent pneumonia [J18.9]  DISCHARGE DIAGNOSIS:   Active Problems:   Metabolic encephalopathy   Hypokalemia   Pneumonia   SECONDARY DIAGNOSIS:   Past Medical History:  Diagnosis Date  . Allergy   . Anxiety   . Arthritis   . Aspiration pneumonitis (HCC) 11/24/2015  . Asthma   . CHF (congestive heart failure) (HCC)   . Chronic pain   . COPD (chronic obstructive pulmonary disease) (HCC)   . Coronary artery disease   . DVT (deep venous thrombosis) (HCC)   . GERD (gastroesophageal reflux disease)   . Headache   . Hyperlipidemia   . Hypertension   . Migraines   . Neuropathy (HCC) 2010  . Osteoporosis   . Oxygen deficiency   . Peripheral vascular disease (HCC)   . Pneumonia   . Pneumonia 11/19/2015  . Pneumonia 10/2016  . Vitamin D deficiency     HOSPITAL COURSE:   66 year old female with past medical history significant for silent aspiration, history of aspiration pneumonias, coronary artery disease, hypertension, neuropathy, history of DVT and conge heart failure presents to hospital secondary to altered mental status and cough.  #1 acute metabolic encephalopathy-secondary to underlying aspiration pneumonia and multiple pain medications. -Mental status is much improved and at baseline -Pain medications have been suspended- no fentanyl and muscle relaxants - lyrica can be started at a lower dose  #2 recurrent aspiration pneumonia-chest x-ray showing clearing left base infiltrate -blood cultures are negative. received vancomycin and cefepime in the hospital - Likely recurrent aspiration -Appreciate speech therapy consult. Prior modified swallow study in 2016  showing silent aspiration. Patient will need to be on thickened liquid diet, however she is refusing at this time. Discussed with both patient and her husband extensively that if not changed to thicker liquids that she might present again with another bout of pneumonia-  - They want to continue on thin liquids at this time. Outpatient repeat modified barium swallow recommended and also aspiration precautions explained - flutter valve, mucinex. Incentive spirometry recommended - weaned off oxygen now -Discharge on augmentin  #3 history of CVA-on Plavix and statin. No acute symptoms  #4GERD-on Protonix  #5 chronic back pain- Held all her pain medications including fentanyl patch, flexeril and tramadol Small dose of xanax prn started for anxiety and also reduced dose of lyrica- Observe, if noted to be sleeping a lot again- please discontinue these  PT recommended rehab- discharge today    DISCHARGE CONDITIONS:   Guarded  CONSULTS OBTAINED:   None  DRUG ALLERGIES:   Allergies  Allergen Reactions  . Percocet [Oxycodone-Acetaminophen] Hives and Rash  . Aspirin Nausea And Vomiting and Other (See Comments)    Reaction:  GI upset   . Codeine Nausea And Vomiting and Other (See Comments)    Reaction:  GI upset   . Propoxyphene Other (See Comments) and Nausea Only    GI upset Reaction:  GI upset   . Sulfa Antibiotics Rash and Other (See Comments)    Reaction:  GI upset    DISCHARGE MEDICATIONS:   Allergies as of 12/25/2016      Reactions   Percocet [oxycodone-acetaminophen] Hives, Rash  Aspirin Nausea And Vomiting, Other (See Comments)   Reaction:  GI upset    Codeine Nausea And Vomiting, Other (See Comments)   Reaction:  GI upset    Propoxyphene Other (See Comments), Nausea Only   GI upset Reaction:  GI upset    Sulfa Antibiotics Rash, Other (See Comments)   Reaction:  GI upset       Medication List    STOP taking these medications   cyclobenzaprine 5 MG  tablet Commonly known as:  FLEXERIL   fentaNYL 50 MCG/HR Commonly known as:  DURAGESIC - dosed mcg/hr   furosemide 40 MG tablet Commonly known as:  LASIX   potassium chloride 10 MEQ tablet Commonly known as:  K-DUR,KLOR-CON   traMADol 50 MG tablet Commonly known as:  ULTRAM     TAKE these medications   acetaminophen 325 MG tablet Commonly known as:  TYLENOL Take 2 tablets (650 mg total) by mouth every 6 (six) hours as needed for mild pain (or Fever >/= 101). What changed:  medication strength  how much to take  reasons to take this   ADVAIR DISKUS 250-50 MCG/DOSE Aepb Generic drug:  Fluticasone-Salmeterol inhale 1 dose by mouth twice a day   albuterol 108 (90 Base) MCG/ACT inhaler Commonly known as:  PROVENTIL HFA;VENTOLIN HFA Inhale 2 puffs into the lungs every 4 (four) hours as needed for wheezing or shortness of breath.   albuterol (2.5 MG/3ML) 0.083% nebulizer solution Commonly known as:  PROVENTIL Take 3 mLs (2.5 mg total) by nebulization every 4 (four) hours as needed for wheezing.   ALPRAZolam 0.25 MG tablet Commonly known as:  XANAX Take 1 tablet (0.25 mg total) by mouth 3 (three) times daily as needed for anxiety. What changed:  when to take this   amoxicillin-clavulanate 875-125 MG tablet Commonly known as:  AUGMENTIN Take 1 tablet by mouth 2 (two) times daily. X 5 more days   celecoxib 100 MG capsule Commonly known as:  CELEBREX Take 1 capsule (100 mg total) by mouth 2 (two) times daily as needed.   clopidogrel 75 MG tablet Commonly known as:  PLAVIX Take 75 mg by mouth daily.   diphenoxylate-atropine 2.5-0.025 MG tablet Commonly known as:  LOMOTIL Take 2 tablets by mouth 4 (four) times daily as needed for diarrhea or loose stools. Reported on 03/11/2016   donepezil 10 MG tablet Commonly known as:  ARICEPT Take 10 mg by mouth at bedtime.   estropipate 0.75 MG tablet Commonly known as:  OGEN Take 1 tablet (0.75 mg total) by mouth every other  day.   fluticasone 50 MCG/ACT nasal spray Commonly known as:  FLONASE Place 2 sprays into both nostrils daily.   guaiFENesin 600 MG 12 hr tablet Commonly known as:  MUCINEX Take 1 tablet (600 mg total) by mouth 2 (two) times daily.   mupirocin ointment 2 % Commonly known as:  BACTROBAN Place 1 application into the nose 2 (two) times daily.   pantoprazole 40 MG tablet Commonly known as:  PROTONIX take 1 tablet by mouth twice a day   pregabalin 75 MG capsule Commonly known as:  LYRICA Take 1 capsule (75 mg total) by mouth 3 (three) times daily. What changed:  medication strength  how much to take   simvastatin 10 MG tablet Commonly known as:  ZOCOR Take 10 mg by mouth at bedtime. Reported on 12/04/2015   SPIRIVA HANDIHALER 18 MCG inhalation capsule Generic drug:  tiotropium inhale the contents of one capsule in the handihaler  once daily   SUMAtriptan 25 MG tablet Commonly known as:  IMITREX Take 25 mg by mouth as needed for migraine. May repeat in 2 hours if headache persists or recurs.   triamcinolone ointment 0.5 % Commonly known as:  KENALOG Apply to lesions on hands twice a day as needed        DISCHARGE INSTRUCTIONS:   1. PCP f/u in 1-2 weeks 2. Incentive spirometry and flutter valve 3. Aspiration precautions  DIET:   Cardiac diet  ACTIVITY:   Activity as tolerated  OXYGEN:   Home Oxygen: No.  Oxygen Delivery: room air  DISCHARGE LOCATION:   nursing home   If you experience worsening of your admission symptoms, develop shortness of breath, life threatening emergency, suicidal or homicidal thoughts you must seek medical attention immediately by calling 911 or calling your MD immediately  if symptoms less severe.  You Must read complete instructions/literature along with all the possible adverse reactions/side effects for all the Medicines you take and that have been prescribed to you. Take any new Medicines after you have completely understood  and accpet all the possible adverse reactions/side effects.   Please note  You were cared for by a hospitalist during your hospital stay. If you have any questions about your discharge medications or the care you received while you were in the hospital after you are discharged, you can call the unit and asked to speak with the hospitalist on call if the hospitalist that took care of you is not available. Once you are discharged, your primary care physician will handle any further medical issues. Please note that NO REFILLS for any discharge medications will be authorized once you are discharged, as it is imperative that you return to your primary care physician (or establish a relationship with a primary care physician if you do not have one) for your aftercare needs so that they can reassess your need for medications and monitor your lab values.    On the day of Discharge:  VITAL SIGNS:   Blood pressure (!) 140/59, pulse 71, temperature 99.2 F (37.3 C), temperature source Oral, resp. rate 16, height 5\' 1"  (1.549 m), weight 53.3 kg (117 lb 6.4 oz), SpO2 91 %.  PHYSICAL EXAMINATION:    GENERAL:  66 y.o.-year-old thin built patient lying in the bed with no acute distress.  EYES: Pupils equal, round, reactive to light and accommodation. No scleral icterus. Extraocular muscles intact.  HEENT: Head atraumatic, normocephalic. Oropharynx and nasopharynx clear.  NECK:  Supple, no jugular venous distention. No thyroid enlargement, no tenderness.  LUNGS: Normal breath sounds bilaterally, no wheezing, rales,rhonchi or crepitation. No use of accessory muscles of respiration. Decreased bibasilar breath sounds. CARDIOVASCULAR: S1, S2 normal. No murmurs, rubs, or gallops.  ABDOMEN: Soft, nontender, nondistended. Bowel sounds present. No organomegaly or mass.  EXTREMITIES: No pedal edema, cyanosis, or clubbing.  NEUROLOGIC: Cranial nerves II through XII are intact. Muscle strength 5/5 in all extremities.  Sensation intact. Gait not checked. Global weakness noted PSYCHIATRIC: The patient is alert and oriented x 3.  SKIN: No obvious rash, lesion, or ulcer.    DATA REVIEW:   CBC  Recent Labs Lab 12/25/16 0524  WBC 10.2  HGB 9.3*  HCT 28.2*  PLT 284    Chemistries   Recent Labs Lab 12/23/16 1030  12/25/16 0524  NA 136  < > 139  K 3.1*  < > 3.5  CL 99*  < > 105  CO2 28  < > 26  GLUCOSE 77  < > 101*  BUN 17  < > 13  CREATININE 0.84  < > 0.59  CALCIUM 8.4*  < > 8.5*  MG 1.9  --   --   AST 13*  --   --   ALT 7*  --   --   ALKPHOS 107  --   --   BILITOT 1.0  --   --   < > = values in this interval not displayed.   Microbiology Results  Results for orders placed or performed during the hospital encounter of 12/23/16  Blood Culture (routine x 2)     Status: None (Preliminary result)   Collection Time: 12/23/16  9:45 AM  Result Value Ref Range Status   Specimen Description BLOOD RIGHT FOREARM  Final   Special Requests   Final    BOTTLES DRAWN AEROBIC AND ANAEROBIC  AEROBIC 11CC, ANAEROBIC 12CC   Culture NO GROWTH 2 DAYS  Final   Report Status PENDING  Incomplete  Blood Culture (routine x 2)     Status: None (Preliminary result)   Collection Time: 12/23/16  9:45 AM  Result Value Ref Range Status   Specimen Description BLOOD LEFT ASSIST CONTROL  Final   Special Requests   Final    BOTTLES DRAWN AEROBIC AND ANAEROBIC  AEROBIC 15CC, ANAEROBIC 14CC   Culture NO GROWTH 2 DAYS  Final   Report Status PENDING  Incomplete  Urine culture     Status: None   Collection Time: 12/23/16  9:45 AM  Result Value Ref Range Status   Specimen Description URINE, RANDOM  Final   Special Requests NONE  Final   Culture   Final    NO GROWTH Performed at [facility], [address], [place], [place] [address]    Report Status 12/24/2016 FINAL  Final  MRSA PCR Screening     Status: None   Collection Time: 12/23/16  3:18 PM  Result Value Ref Range Status   MRSA by PCR NEGATIVE  NEGATIVE Final    Comment:        The GeneXpert MRSA Assay (FDA approved for NASAL specimens only), is one component of a comprehensive MRSA colonization surveillance program. It is not intended to diagnose MRSA infection nor to guide or monitor treatment for MRSA infections.     RADIOLOGY:  No results found.   Management plans discussed with the patient, family and they are in agreement.  CODE STATUS:     Code Status Orders        Start     Ordered   12/23/16 1745  Full code  Continuous     12/23/16 1744    Code Status History    Date Active Date Inactive Code Status Order ID Comments User Context   11/06/2016  9:00 AM 11/07/2016  6:53 PM Full Code [id number]  Delfino Lovett, MD Inpatient   11/05/2016  2:01 PM 11/06/2016  9:00 AM Partial Code [id number]  Adrian Saran, MD Inpatient   06/03/2016  2:53 AM 06/05/2016  7:45 PM Full Code [id number]  Gery Pray, MD Inpatient   11/19/2015 12:18 PM 11/24/2015  4:24 PM Full Code [id number]  Shaune Pollack, MD Inpatient   11/09/2015 11:02 PM 11/13/2015  6:51 PM Full Code [id number]  Wyatt Haste, MD ED   10/17/2015 10:26 PM 10/22/2015  1:03 PM Full Code [id number]  Enedina Finner, MD Inpatient   10/09/2015  5:38 PM 10/12/2015  7:34 PM Full Code [id number]  Alfredo Batty  Imogene Burnhen, MD Inpatient   08/01/2015 12:08 AM 08/03/2015  2:54 PM Full Code [id number]  Oralia Manisavid Willis, MD Inpatient      TOTAL TIME TAKING CARE OF THIS PATIENT: 37 minutes.    Enid BaasKALISETTI,Aimar Borghi M.D on 12/25/2016 at 2:38 PM  Between 7am to 6pm - Pager - [phone]  After 6pm go to [email or web address] - Social research officer, governmentpassword EPAS [facility]  [facility]  Office  [phone]  CC: Primary care physician; Mila Merryonald Fisher, MD   Note: This dictation was prepared with Dragon dictation along with smaller phrase technology. Any transcriptional errors that result from this process are unintentional.

## 2016-12-25 NOTE — Progress Notes (Signed)
Report called to Franne FortsShana K. RN.

## 2016-12-25 NOTE — Progress Notes (Addendum)
[facility] [employer] authorization for SNF has been received. Patient can D/C to [facility] when stable. Patient's husband [name] is aware of above.   Patient is medically stable for D/C to [facility] today. Per Tresa EndoKelly patient will go to E-9. RN will call report. Patient's husband [name] will transport. CSW sent D/C orders to [facility]. Please reconsult if future social work needs arise. CSW signing off.   [employer] IncorporatedBailey Daemian Gahm, LCSW [phone]

## 2016-12-25 NOTE — Telephone Encounter (Signed)
[facility] and is scheduled for 1 week F/U @ 230 pm on 01/01/17. Thanks TNP

## 2016-12-25 NOTE — Consult Note (Signed)
   [facility] Inpatient Consult   12/25/2016  Sueanne MargaritaMary George Rosamilia 03-10-1951 [id number]   Patient is [facility] active with [facility] for chronic disease management services.  Patient has been engaged by a [employer] Community Care Coordinator and CSW.  Our community based plan of care has focused on disease management and community resource support. Patient will be followed at SNF to assist with discharge planning to home.  Patient will also receive a post-SNF discharge transition of care call and will be evaluated for monthly home visits for assessments and disease process education. Of note, [facility] Care [facility] services does not replace or interfere with any services that are needed or arranged by inpatient case management or social work.  For additional questions or referrals please contact:  Morrell Fluke RN, BSN [facility]  Hospital Liaison  [phone]) Business Mobile [phone]) Toll free office

## 2016-12-25 NOTE — Clinical Social Work Placement (Signed)
   CLINICAL SOCIAL WORK PLACEMENT  NOTE  Date:  12/25/2016  Patient Details  Name: Cheryl [name] MRN: [id number] Date of Birth: 04/04/1951  Clinical Social Work is seeking post-discharge placement for this patient at the Skilled  Nursing Facility level of care (*CSW will initial, date and re-position this form in  chart as items are completed):  Yes   Patient/family provided with [place] Clinical Social Work Department's list of facilities offering this level of care within the geographic area requested by the patient (or if unable, by the patient's family).  Yes   Patient/family informed of their freedom to choose among providers that offer the needed level of care, that participate in Medicare, Medicaid or managed care program needed by the patient, have an available bed and are willing to accept the patient.  Yes   Patient/family informed of [facility]'s ownership interest in [facility] and [facility], as well as of the fact that they are under no obligation to receive care at these facilities.  PASRR submitted to EDS on       PASRR number received on       Existing PASRR number confirmed on 12/24/16     FL2 transmitted to all facilities in geographic area requested by pt/family on 12/24/16     FL2 transmitted to all facilities within larger geographic area on       Patient informed that his/her managed care company has contracts with or will negotiate with certain facilities, including the following:        Yes   Patient/family informed of bed offers received.  Patient chooses bed at  [facility] )     Physician recommends and patient chooses bed at      Patient to be transferred to  [facility] ) on 12/25/16.  Patient to be transferred to facility by  (Patient's husband [name] will transport. )     Patient family notified on 12/25/16 of transfer.  Name of family member notified:   (Patient's husband is aware of D/C today. )     PHYSICIAN        Additional Comment:    _______________________________________________ Brookley Spitler, Darleen CrockerBailey M, LCSW 12/25/2016, 2:57 PM

## 2016-12-26 ENCOUNTER — Other Ambulatory Visit: Payer: Self-pay | Admitting: Licensed Clinical Social Worker

## 2016-12-26 NOTE — Patient Outreach (Signed)
Assessment:  CSW T[employer] BrothersChrystal [name] received referral on 12/26/16 for Dillard'sMary George [name].  CSW Cheryl FewScott Jakwon [name] is covering for [employer]Chrystal. CSW Lorin PicketScott Cheryl [name] called Cheryl [name], spouse of client, on 12/26/16.  CSW verified identity of Cheryl [name]. CSW received verbal permission from Cheryl [name] for CSW to speak with Cheryl [name] about client needs and status. Cheryl [name] reported that client had [facility]. Cheryl [name] reported that client discharged [facility] on 12/25/16. Client [facility], [address], [place], [place] [address]. Facility phone number is [phone].  Cheryl [name] said that client had been having breathing issues. She has history of recurring pneumonia. She is at skilled nursing facility for nursing care and for physical therapy support. Cheryl [name] said that client is fatigued and may not be able to talk via phone at this time since she is receiving oxygen support and is very fatigued  CSW informed Cheryl [name] on 12/26/16 that CSW Cheryl [name] would be getting in touch with client and with Cheryl [name] to discuss current needs and plans of client. Cheryl BelfastFrank [name] agreed to this contact/communication from [employer]Chrystal Land LCSW.  CSW thanked Cheryl [name] for phone call with CSW Cheryl FewScott Mellina Benison on 12/26/16.  Plan:  CSW Cheryl [name] to contact client or Cheryl [name] in next 2 weeks to discuss client status and needs.  Cheryl PillarMichael S.Kaley [name] MSW, LCSW Licensed Clinical Social Worker [facility] Care [facility] [phone]

## 2016-12-27 NOTE — Telephone Encounter (Signed)
Transition Care Management Follow-up Telephone Call    Date discharged? 12/25/16  How have you been since you were released from the hospital? [facility]. Pt states she is still having some trouble breathing and coughing, but not as bad as it was. Pt is able to talk today, whereas yesterday she could not. Pt denies fever, vomiting or diarrhea.   Any patient concerns? None   Items Reviewed:  Medications reviewed: No, pt states the facility and her husband are responsible for what she takes.   Allergies reviewed: Yes  Dietary changes reviewed: N/A  Referrals reviewed: N/A   Functional Questionnaire:  Independent - I Dependent - D    Activities of Daily Living (ADLs):    Personal hygiene - I Dressing - I Eating - I Maintaining continence - I Transferring - I   Independent Activities of Daily Living (iADLs): Basic communication skills - I Transportation - I Meal preparation - D Shopping - I Housework - I Managing medications - D  Managing personal finances - D   Confirmed importance and date/time of follow-up visits scheduled YES  Provider Appointment booked with PCP 01/01/17 @ 2:30 PM.  Confirmed with patient if condition begins to worsen call PCP or go to the ER.  Patient was given the office number and encouraged to call back with question or concerns: YES

## 2016-12-28 LAB — CULTURE, BLOOD (ROUTINE X 2)
CULTURE: NO GROWTH
Culture: NO GROWTH

## 2016-12-30 ENCOUNTER — Other Ambulatory Visit: Payer: Self-pay | Admitting: *Deleted

## 2016-12-30 NOTE — Patient Outreach (Signed)
[facility] Care Management  12/30/2016  [name] 02-27-1951 [id number]   EMMI red flag alert.  Verified [facility].  Plan Will follow up with transition of care when appropriate, will await communication from [facility] LCSW. Will notify CMA of patient being at SNF and closure to care management.   Egbert GaribaldiKimberly Johara Lodwick, RN, [facility] Care [facility] [phone]- Mobile [phone]- Toll Free Main Office

## 2016-12-31 DIAGNOSIS — R0902 Hypoxemia: Secondary | ICD-10-CM | POA: Diagnosis not present

## 2016-12-31 DIAGNOSIS — F17218 Nicotine dependence, cigarettes, with other nicotine-induced disorders: Secondary | ICD-10-CM | POA: Diagnosis not present

## 2016-12-31 DIAGNOSIS — G894 Chronic pain syndrome: Secondary | ICD-10-CM | POA: Diagnosis not present

## 2016-12-31 DIAGNOSIS — I2510 Atherosclerotic heart disease of native coronary artery without angina pectoris: Secondary | ICD-10-CM | POA: Diagnosis not present

## 2016-12-31 DIAGNOSIS — J441 Chronic obstructive pulmonary disease with (acute) exacerbation: Secondary | ICD-10-CM | POA: Diagnosis not present

## 2016-12-31 DIAGNOSIS — F411 Generalized anxiety disorder: Secondary | ICD-10-CM | POA: Diagnosis not present

## 2016-12-31 DIAGNOSIS — J189 Pneumonia, unspecified organism: Secondary | ICD-10-CM | POA: Diagnosis not present

## 2016-12-31 DIAGNOSIS — F0280 Dementia in other diseases classified elsewhere without behavioral disturbance: Secondary | ICD-10-CM | POA: Diagnosis not present

## 2016-12-31 DIAGNOSIS — E785 Hyperlipidemia, unspecified: Secondary | ICD-10-CM | POA: Diagnosis not present

## 2017-01-01 ENCOUNTER — Inpatient Hospital Stay: Payer: Medicare HMO | Admitting: Family Medicine

## 2017-01-05 ENCOUNTER — Emergency Department
Admission: EM | Admit: 2017-01-05 | Discharge: 2017-01-06 | Disposition: A | Discharge status: Home / Self Care | Payer: Medicare HMO | Attending: Emergency Medicine | Admitting: Emergency Medicine

## 2017-01-05 ENCOUNTER — Emergency Department: Payer: Medicare HMO

## 2017-01-05 ENCOUNTER — Encounter: Payer: Self-pay | Admitting: Emergency Medicine

## 2017-01-05 DIAGNOSIS — J45909 Unspecified asthma, uncomplicated: Secondary | ICD-10-CM | POA: Insufficient documentation

## 2017-01-05 DIAGNOSIS — J449 Chronic obstructive pulmonary disease, unspecified: Secondary | ICD-10-CM | POA: Diagnosis not present

## 2017-01-05 DIAGNOSIS — J9811 Atelectasis: Secondary | ICD-10-CM | POA: Diagnosis not present

## 2017-01-05 DIAGNOSIS — F17210 Nicotine dependence, cigarettes, uncomplicated: Secondary | ICD-10-CM | POA: Insufficient documentation

## 2017-01-05 DIAGNOSIS — Z79899 Other long term (current) drug therapy: Secondary | ICD-10-CM | POA: Insufficient documentation

## 2017-01-05 DIAGNOSIS — J181 Lobar pneumonia, unspecified organism: Secondary | ICD-10-CM | POA: Diagnosis not present

## 2017-01-05 DIAGNOSIS — I110 Hypertensive heart disease with heart failure: Secondary | ICD-10-CM | POA: Diagnosis not present

## 2017-01-05 DIAGNOSIS — R509 Fever, unspecified: Secondary | ICD-10-CM

## 2017-01-05 DIAGNOSIS — I5031 Acute diastolic (congestive) heart failure: Secondary | ICD-10-CM | POA: Insufficient documentation

## 2017-01-05 DIAGNOSIS — J189 Pneumonia, unspecified organism: Secondary | ICD-10-CM

## 2017-01-05 LAB — URINALYSIS, COMPLETE (UACMP) WITH MICROSCOPIC
BACTERIA UA: NONE SEEN
BILIRUBIN URINE: NEGATIVE
Glucose, UA: NEGATIVE mg/dL
Hgb urine dipstick: NEGATIVE
KETONES UR: NEGATIVE mg/dL
LEUKOCYTES UA: NEGATIVE
NITRITE: NEGATIVE
PH: 7 (ref 5.0–8.0)
PROTEIN: NEGATIVE mg/dL
Specific Gravity, Urine: 1.011 (ref 1.005–1.030)

## 2017-01-05 LAB — CBC WITH DIFFERENTIAL/PLATELET
Basophils Absolute: 0.1 10*3/uL (ref 0–0.1)
Basophils Relative: 0 %
Eosinophils Absolute: 0.1 10*3/uL (ref 0–0.7)
Eosinophils Relative: 1 %
HEMATOCRIT: 27.8 % — AB (ref 35.0–47.0)
HEMOGLOBIN: 9.2 g/dL — AB (ref 12.0–16.0)
LYMPHS ABS: 0.6 10*3/uL — AB (ref 1.0–3.6)
LYMPHS PCT: 4 %
MCH: 28.3 pg (ref 26.0–34.0)
MCHC: 33 g/dL (ref 32.0–36.0)
MCV: 85.7 fL (ref 80.0–100.0)
MONOS PCT: 5 %
Monocytes Absolute: 0.6 10*3/uL (ref 0.2–0.9)
NEUTROS PCT: 90 %
Neutro Abs: 11.7 10*3/uL — ABNORMAL HIGH (ref 1.4–6.5)
Platelets: 375 10*3/uL (ref 150–440)
RBC: 3.24 MIL/uL — AB (ref 3.80–5.20)
RDW: 17.5 % — ABNORMAL HIGH (ref 11.5–14.5)
WBC: 13 10*3/uL — AB (ref 3.6–11.0)

## 2017-01-05 LAB — COMPREHENSIVE METABOLIC PANEL
ALK PHOS: 117 U/L (ref 38–126)
ALT: 12 U/L — ABNORMAL LOW (ref 14–54)
ANION GAP: 8 (ref 5–15)
AST: 30 U/L (ref 15–41)
Albumin: 3.3 g/dL — ABNORMAL LOW (ref 3.5–5.0)
BILIRUBIN TOTAL: 0.4 mg/dL (ref 0.3–1.2)
BUN: 13 mg/dL (ref 6–20)
CO2: 28 mmol/L (ref 22–32)
CREATININE: 0.94 mg/dL (ref 0.44–1.00)
Calcium: 8.6 mg/dL — ABNORMAL LOW (ref 8.9–10.3)
Chloride: 99 mmol/L — ABNORMAL LOW (ref 101–111)
GFR calc non Af Amer: >60 mL/min (ref >60)
GLUCOSE: 105 mg/dL — AB (ref 65–99)
Potassium: 4.4 mmol/L (ref 3.5–5.1)
Sodium: 135 mmol/L (ref 135–145)
TOTAL PROTEIN: 6.8 g/dL (ref 6.5–8.1)

## 2017-01-05 LAB — INFLUENZA PANEL BY PCR (TYPE A & B)
INFLBPCR: NEGATIVE
Influenza A By PCR: NEGATIVE

## 2017-01-05 LAB — TROPONIN I: Troponin I: <0.03 ng/mL (ref <0.03)

## 2017-01-05 LAB — BRAIN NATRIURETIC PEPTIDE: B Natriuretic Peptide: 35 pg/mL (ref 0.0–100.0)

## 2017-01-05 LAB — LACTIC ACID, PLASMA: Lactic Acid, Venous: 1 mmol/L (ref 0.5–1.9)

## 2017-01-05 MED ORDER — LEVOFLOXACIN IN D5W 750 MG/150ML IV SOLN
750.0000 mg | Freq: Once | INTRAVENOUS | Status: AC
  Administered 2017-01-05: 750 mg via INTRAVENOUS
  Filled 2017-01-05: qty 150

## 2017-01-05 MED ORDER — LEVOFLOXACIN 750 MG PO TABS: 750.0000 mg | ORAL_TABLET | Freq: Every day | ORAL | 0 refills | Status: DC

## 2017-01-05 NOTE — ED Notes (Signed)
Patient from [facility]. Pt was sent for fever and weakness. Pt denies weakness. Pt's family reports that patient is normally more active/alert. Pt AO X 4. Ambulates with no issue. Pt reports no complaints.

## 2017-01-05 NOTE — ED Triage Notes (Addendum)
Patient brought in by ems from [facility]. Per [facility] patient developed fever and chills today. Patient was recently treated for pneumonia. [facility]. Patient denies chest pain or shortness of breath.

## 2017-01-05 NOTE — ED Notes (Signed)
Pt was assisted to the restroom.  

## 2017-01-05 NOTE — ED Provider Notes (Signed)
[facility] Emergency Department Provider Note        Time seen: ----------------------------------------- 8:13 PM on 01/05/2017 -----------------------------------------    I have reviewed the triage vital signs and the nursing notes.   HISTORY  Chief Complaint Fever and Chills    HPI Cheryl [name] is a 66 y.o. female who presents to ER being brought by EMS from [place]. She developed fever and chills today, was recently treated for pneumonia. She was sent here for evaluation concerning possible worsening of her pneumonia or other etiology. She denies chest pain or difficulty breathing. She does wear 2 L of oxygen all the time. Other than fevers and chills, she denies complaints.   Past Medical History:  Diagnosis Date  . Allergy   . Anxiety   . Arthritis   . Aspiration pneumonitis (HCC) 11/24/2015  . Asthma   . Chronic pain   . COPD (chronic obstructive pulmonary disease) (HCC)   . Coronary artery disease   . DVT (deep venous thrombosis) (HCC)   . GERD (gastroesophageal reflux disease)   . Headache   . Hyperlipidemia   . Hypertension   . Migraines   . Neuropathy (HCC) 2010  . Osteoporosis   . Oxygen deficiency   . Peripheral vascular disease (HCC)   . Pneumonia   . Pneumonia 11/19/2015  . Pneumonia 10/2016  . Vitamin D deficiency     Patient Active Problem List   Diagnosis Date Noted  . Metabolic encephalopathy 12/23/2016  . Hypokalemia 12/23/2016  . Pneumonia 12/23/2016  . Protein-calorie malnutrition, severe 11/06/2016  . Community acquired pneumonia 11/05/2016  . Nausea with vomiting   . Lumbar facet syndrome (Location of Primary Source of Pain) (Bilateral) (L>R) 03/20/2016  . Lumbar spondylosis 03/20/2016  . Opiate use (160 MME/Day) 02/28/2016  . Anemia 02/28/2016  . Osteoarthrosis 12/18/2015  . Long term current use of anticoagulant therapy (Plavix) 12/18/2015  . Long term current use of opiate analgesic 12/04/2015   . Fibromyalgia 12/04/2015  . Dysphagia 11/24/2015  . Acute diastolic CHF (congestive heart failure) (HCC) 11/24/2015  . Leukocytosis 11/24/2015  . Sepsis (HCC) 10/17/2015  . Abnormal mammogram of right breast 10/11/2015  . Dilated intrahepatic bile duct 10/11/2015  . Lumbar radicular pain (B) (L>R) (L4) 09/04/2015  . Chronic bilateral low back pain without sciatica 09/04/2015  . Encounter for therapeutic drug level monitoring 09/04/2015  . Uncomplicated opioid dependence (HCC) 09/04/2015  . Chronic pain syndrome 09/04/2015  . Platelet inhibition due to Plavix 09/04/2015  . COPD (chronic obstructive pulmonary disease) (HCC) 07/31/2015  . Dementia 07/31/2015  . Airway hyperreactivity 07/03/2015  . Back pain, thoracic 07/03/2015  . Excessive falling 07/03/2015  . Alteration in bowel elimination: incontinence 07/03/2015  . Insomnia 07/03/2015  . Decreased testosterone level 07/03/2015  . Leg weakness 07/03/2015  . Menopausal symptom 07/03/2015  . Migraine 07/03/2015  . Neuropathy (HCC) 07/03/2015  . Fecal occult blood test positive 07/03/2015  . OP (osteoporosis) 07/03/2015  . Panic disorder 07/03/2015  . Compulsive tobacco user syndrome 07/03/2015  . Urinary incontinence 07/03/2015  . Weight loss 07/03/2015  . Essential hypertension 06/06/2015  . GERD (gastroesophageal reflux disease) 06/06/2015  . Hyperlipemia 06/06/2015  . 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 Anxiety 02/10/2014  . Coronary artery disease 02/10/2014  . Hypercholesteremia 02/10/2014  . Peripheral vascular disease (HCC) 02/10/2014  . Vitamin D deficiency 10/16/2009    Past Surgical History:  Procedure Laterality Date  . ABDOMINAL HYSTERECTOMY  1975   Bilaterl Oophorectomy;  Dur to IUD infection  . abdomnal aortic stent  05/30/2008   Dr. Nanetta Batty  . APPENDECTOMY    . APPENDECTOMY    . cardiac catherization  10/31/2009  . CERVICAL FUSION  C5 - 6/C6-7  . CHOLECYSTECTOMY  1972  .  COLONOSCOPY WITH PROPOFOL N/A 07/27/2015   Procedure: COLONOSCOPY WITH PROPOFOL;  Surgeon: Wallace Cullens, MD;  Location: [facility] ENDOSCOPY;  Service: Gastroenterology;  Laterality: N/A;  . ESOPHAGOGASTRODUODENOSCOPY (EGD) WITH PROPOFOL N/A 07/27/2015   Procedure: ESOPHAGOGASTRODUODENOSCOPY (EGD) WITH PROPOFOL;  Surgeon: Wallace Cullens, MD;  Location: [facility] ENDOSCOPY;  Service: Gastroenterology;  Laterality: N/A;  . FOOT SURGERY Bilateral    5-6 years per patient  . SPINE SURGERY      Allergies Percocet [oxycodone-acetaminophen]; Aspirin; Codeine; Propoxyphene; and Sulfa antibiotics  Social History Social History  Substance Use Topics  . Smoking status: Current Every Day Smoker    Packs/day: 0.50    Years: 50.00    Types: Cigarettes  . Smokeless tobacco: Never Used     Comment: Previously smoked 2 ppd  . Alcohol use No    Review of Systems Constitutional: Positive for fever and chills Cardiovascular: Negative for chest pain. Respiratory: Negative for shortness of breath. Gastrointestinal: Negative for abdominal pain, vomiting and diarrhea. Genitourinary: Negative for dysuria. Musculoskeletal: Negative for back pain. Skin: Negative for rash. Neurological: Negative for headaches, focal weakness or numbness.  10-point ROS otherwise negative.  ____________________________________________   PHYSICAL EXAM:  VITAL SIGNS: ED Triage Vitals  Enc Vitals Group     BP 01/05/17 2005 (!) 105/43     Pulse Rate 01/05/17 2005 (!) 102     Resp 01/05/17 2005 18     Temp 01/05/17 2005 (!) 100.4 F (38 C)     Temp Source 01/05/17 2005 Oral     SpO2 01/05/17 2009 92 %     Weight 01/05/17 2001 96 lb (43.5 kg)     Height 01/05/17 2001 4\' 11"  (1.499 m)     Head Circumference --      Peak Flow --      Pain Score --      Pain Loc --      Pain Edu? --      Excl. in GC? --     Constitutional: Alert and oriented. Well appearing and in no distress. Eyes: Conjunctivae are normal. PERRL. Normal extraocular  movements. ENT   Head: Normocephalic and atraumatic.   Nose: No congestion/rhinnorhea.   Mouth/Throat: Mucous membranes are moist.   Neck: No stridor. Cardiovascular: Normal rate, regular rhythm. No murmurs, rubs, or gallops. Respiratory: Normal respiratory effort without tachypnea nor retractions. Breath sounds are clear and equal bilaterally. No wheezes/rales/rhonchi. Gastrointestinal: Soft and nontender. Normal bowel sounds Musculoskeletal: Nontender with normal range of motion in all extremities. No lower extremity tenderness nor edema. Neurologic:  Normal speech and language. No gross focal neurologic deficits are appreciated.  Skin:  Skin is warm, dry and intact. No rash noted. Psychiatric: Mood and affect are normal. Speech and behavior are normal.  ____________________________________________  ED COURSE:  Pertinent labs & imaging results that were available during my care of the patient were reviewed by me and considered in my medical decision making (see chart for details). Patient presents to the ER for fever, possible influenza or worsening pneumonia. We will check labs and imaging.   Procedures ____________________________________________   LABS (pertinent positives/negatives)  Labs Reviewed  CBC WITH DIFFERENTIAL/PLATELET - Abnormal; Notable for the following:  Result Value   WBC 13.0 (*)    RBC 3.24 (*)    Hemoglobin 9.2 (*)    HCT 27.8 (*)    RDW 17.5 (*)    Neutro Abs 11.7 (*)    Lymphs Abs 0.6 (*)    All other components within normal limits  COMPREHENSIVE METABOLIC PANEL - Abnormal; Notable for the following:    Chloride 99 (*)    Glucose, Bld 105 (*)    Calcium 8.6 (*)    Albumin 3.3 (*)    ALT 12 (*)    All other components within normal limits  URINALYSIS, COMPLETE (UACMP) WITH MICROSCOPIC - Abnormal; Notable for the following:    Color, Urine YELLOW (*)    APPearance CLEAR (*)    Squamous Epithelial / LPF 0-5 (*)    All other  components within normal limits  TROPONIN I  INFLUENZA PANEL BY PCR (TYPE A & B)  BRAIN NATRIURETIC PEPTIDE  LACTIC ACID, PLASMA  LACTIC ACID, PLASMA    RADIOLOGY Images were viewed by me  Chest x-ray  IMPRESSION: Bibasilar atelectasis with faint pulmonary consolidation in right middle lobe distribution with small parapneumonic effusion. ____________________________________________  FINAL ASSESSMENT AND PLAN  Fever, Possible pneumonia  Plan: Patient with labs and imaging as dictated above. Patient presented to the ER after having finished azithromycin for a previous episode of pneumonia. I have given her IV Levaquin. I did try to admit her but she declined. She prefers to go back to the nursing home. I advised if she gets worse to return immediately. She'll be discharged on Levaquin.   Emily FilbertWilliams, Jonathan E, MD   Note: This note was generated in part or whole with voice recognition software. Voice recognition is usually quite accurate but there are transcription errors that can and very often do occur. I apologize for any typographical errors that were not detected and corrected.     Emily FilbertJonathan E Williams, MD 01/05/17 2241

## 2017-01-06 NOTE — ED Notes (Signed)
Reviewed d/c instructions, follow-up care, prescription with patient and Thayer OhmChris Banker(RN) from [facility]. Pt and facility RN verbalized understanding.

## 2017-01-07 DIAGNOSIS — J441 Chronic obstructive pulmonary disease with (acute) exacerbation: Secondary | ICD-10-CM | POA: Diagnosis not present

## 2017-01-07 DIAGNOSIS — J189 Pneumonia, unspecified organism: Secondary | ICD-10-CM | POA: Diagnosis not present

## 2017-01-09 ENCOUNTER — Encounter: Payer: Self-pay | Admitting: *Deleted

## 2017-01-10 ENCOUNTER — Encounter: Payer: Self-pay | Admitting: *Deleted

## 2017-01-10 ENCOUNTER — Ambulatory Visit: Payer: Medicare HMO | Admitting: Family Medicine

## 2017-01-10 ENCOUNTER — Other Ambulatory Visit: Payer: Self-pay | Admitting: *Deleted

## 2017-01-10 NOTE — Patient Outreach (Signed)
[facility]) Care Management  [facility]  01/10/2017  Cheryl MargaritaMary George [name] 1951-08-17 [id number]  Subjective:  Patient states that she is ready to return home, however is still feeling weakness in her legs.  She states that she is actively working with PT on this.  Patient reports plans of reducing her stress at home by not providing care to her 4 great grandchildren.  Per patient she would routinely keep this from  Friday-Sunday, and to help improve her health, she has refused to do this anymore.   Objective:   Encounter Medications:  Outpatient Encounter Prescriptions as of 01/10/2017  Medication Sig Note  . acetaminophen (TYLENOL) 325 MG tablet Take 2 tablets (650 mg total) by mouth every 6 (six) hours as needed for mild pain (or Fever >/= 101).   . ADVAIR DISKUS 250-50 MCG/DOSE AEPB inhale 1 dose by mouth twice a day   . albuterol (PROVENTIL HFA;VENTOLIN HFA) 108 (90 BASE) MCG/ACT inhaler Inhale 2 puffs into the lungs every 4 (four) hours as needed for wheezing or shortness of breath.   Marland Kitchen. albuterol (PROVENTIL) (2.5 MG/3ML) 0.083% nebulizer solution Take 3 mLs (2.5 mg total) by nebulization every 4 (four) hours as needed for wheezing.   Marland Kitchen. ALPRAZolam (XANAX) 0.25 MG tablet Take 1 tablet (0.25 mg total) by mouth 3 (three) times daily as needed for anxiety.   Marland Kitchen. amoxicillin-clavulanate (AUGMENTIN) 875-125 MG tablet Take 1 tablet by mouth 2 (two) times daily. X 5 more days   . celecoxib (CELEBREX) 100 MG capsule Take 1 capsule (100 mg total) by mouth 2 (two) times daily as needed. 12/04/2016: Taking only as needed  . clopidogrel (PLAVIX) 75 MG tablet Take 75 mg by mouth daily.   . diphenoxylate-atropine (LOMOTIL) 2.5-0.025 MG per tablet Take 2 tablets by mouth 4 (four) times daily as needed for diarrhea or loose stools. Reported on 03/11/2016   . donepezil (ARICEPT) 10 MG tablet Take 10 mg by mouth at bedtime.   Marland Kitchen. estropipate (OGEN) 0.75 MG tablet Take 1 tablet (0.75 mg  total) by mouth every other day.   . fluticasone (FLONASE) 50 MCG/ACT nasal spray Place 2 sprays into both nostrils daily.   Marland Kitchen. guaiFENesin (MUCINEX) 600 MG 12 hr tablet Take 1 tablet (600 mg total) by mouth 2 (two) times daily.   Marland Kitchen. levofloxacin (LEVAQUIN) 750 MG tablet Take 1 tablet (750 mg total) by mouth daily.   . mupirocin ointment (BACTROBAN) 2 % Place 1 application into the nose 2 (two) times daily.   . pantoprazole (PROTONIX) 40 MG tablet take 1 tablet by mouth twice a day   . pregabalin (LYRICA) 75 MG capsule Take 1 capsule (75 mg total) by mouth 3 (three) times daily.   . simvastatin (ZOCOR) 10 MG tablet Take 10 mg by mouth at bedtime. Reported on 12/04/2015   . SPIRIVA HANDIHALER 18 MCG inhalation capsule inhale the contents of one capsule in the handihaler once daily   . SUMAtriptan (IMITREX) 25 MG tablet Take 25 mg by mouth as needed for migraine. May repeat in 2 hours if headache persists or recurs.   . triamcinolone ointment (KENALOG) 0.5 % Apply to lesions on hands twice a day as needed    No facility-administered encounter medications on file as of 01/10/2017.     Functional Status:  In your present state of health, do you have any difficulty performing the following activities: 12/24/2016 12/11/2016  Hearing? N N  Vision? N N  Difficulty concentrating or making  decisions? Y N  Walking or climbing stairs? Y Y  Dressing or bathing? Y Y  Doing errands, shopping? [name]  Preparing Food and eating ? - Y  Using the Toilet? - Y  In the past six months, have you accidently leaked urine? - Y  Do you have problems with loss of bowel control? - N  Managing your Medications? - Y  Managing your Finances? - Y  Housekeeping or managing your Housekeeping? - N  Some recent data might be hidden    Fall/Depression Screening:  PHQ 2/9 Scores 01/10/2017 12/11/2016 12/05/2016 11/12/2016 08/20/2016 07/08/2016 07/03/2016  PHQ - 2 Score 0 0 0 0 0 0 0    Assessment:  Patient very friently and engading,  was sitting up in her bed waiting to work with PT when this Child psychotherapist arrived. Per patient, legs are still very weak. Patient  continues to smoke, however states she has cut down to a pack every 2 days.    This Child psychotherapist spoke with discharge planner, Louanne Belton who states that patient's pneumonia has improved,she has taken her last does of antibiotics this morning, however she is still very weak. Patient's insurance is no longer covering her stay, she has been transitioned to a Medicaid bed. Per Waynetta Sandy, patient can stay long term if needed. Patient most likely will return home with  [facility] through [facility].  Per discharge planner, she is starting to see some decline.  There is no discharge date yet.  Family dynamics a related to caregiving demands discussed as well as  poor diet and patient's unwillingness to stop smoking.  Plan: This Child psychotherapist will continue to follow patient while in the SNF.

## 2017-01-16 ENCOUNTER — Other Ambulatory Visit: Payer: Self-pay | Admitting: *Deleted

## 2017-01-16 NOTE — Patient Outreach (Signed)
[facility]) Care Management  01/16/2017  Sueanne MargaritaMary George Guilford 1951-05-05 [id number]   [facility], the [facility] planner at [facility],  who confirmed patient's discharge from the facility today with home PT through [facility]. RNCM to be notified for transition of care.   Adriana ReamsChrystal Ilana Prezioso, LCSW [facility] Care Management [phone]

## 2017-01-17 ENCOUNTER — Other Ambulatory Visit: Payer: Self-pay | Admitting: *Deleted

## 2017-01-17 NOTE — Patient Outreach (Signed)
[facility]) Care Management  01/17/2017  Cheryl [name] September 17, 1951 [id number]   Initial transition of care call  [facility], after recent hospitalization for Aspiration Pneumonia .  1000 Spoke with patient, reports she is doing fine,states she is not at home at this time, she is at office getting dentures, her daughter is with her. Patient agreeable to my returning call later today. Explained transition of care follow up.    1600 Placed call to patient , reports she is still not at home, reports she had dentures fitted and 2 teeth extracted on today. Request follow up on next week.   Unable to complete full transition of care assessment .   Plan Patient agreeable to  Follow up in the next business day  as part of transition of care program, with telephone outreach.   Cheryl GaribaldiKimberly Mylik Pro, RN, [facility] Care Management [phone]- Mobile [phone]- Toll Free Main Office

## 2017-01-18 DIAGNOSIS — I110 Hypertensive heart disease with heart failure: Secondary | ICD-10-CM | POA: Diagnosis not present

## 2017-01-18 DIAGNOSIS — F17219 Nicotine dependence, cigarettes, with unspecified nicotine-induced disorders: Secondary | ICD-10-CM | POA: Diagnosis not present

## 2017-01-18 DIAGNOSIS — I509 Heart failure, unspecified: Secondary | ICD-10-CM | POA: Diagnosis not present

## 2017-01-18 DIAGNOSIS — I739 Peripheral vascular disease, unspecified: Secondary | ICD-10-CM | POA: Diagnosis not present

## 2017-01-18 DIAGNOSIS — E43 Unspecified severe protein-calorie malnutrition: Secondary | ICD-10-CM | POA: Diagnosis not present

## 2017-01-18 DIAGNOSIS — G629 Polyneuropathy, unspecified: Secondary | ICD-10-CM | POA: Diagnosis not present

## 2017-01-18 DIAGNOSIS — J181 Lobar pneumonia, unspecified organism: Secondary | ICD-10-CM | POA: Diagnosis not present

## 2017-01-18 DIAGNOSIS — J449 Chronic obstructive pulmonary disease, unspecified: Secondary | ICD-10-CM | POA: Diagnosis not present

## 2017-01-18 DIAGNOSIS — J45909 Unspecified asthma, uncomplicated: Secondary | ICD-10-CM | POA: Diagnosis not present

## 2017-01-20 ENCOUNTER — Other Ambulatory Visit: Payer: Self-pay | Admitting: *Deleted

## 2017-01-20 DIAGNOSIS — F17219 Nicotine dependence, cigarettes, with unspecified nicotine-induced disorders: Secondary | ICD-10-CM | POA: Diagnosis not present

## 2017-01-20 DIAGNOSIS — I739 Peripheral vascular disease, unspecified: Secondary | ICD-10-CM | POA: Diagnosis not present

## 2017-01-20 DIAGNOSIS — I509 Heart failure, unspecified: Secondary | ICD-10-CM | POA: Diagnosis not present

## 2017-01-20 DIAGNOSIS — J449 Chronic obstructive pulmonary disease, unspecified: Secondary | ICD-10-CM | POA: Diagnosis not present

## 2017-01-20 DIAGNOSIS — J45909 Unspecified asthma, uncomplicated: Secondary | ICD-10-CM | POA: Diagnosis not present

## 2017-01-20 DIAGNOSIS — G629 Polyneuropathy, unspecified: Secondary | ICD-10-CM | POA: Diagnosis not present

## 2017-01-20 DIAGNOSIS — I110 Hypertensive heart disease with heart failure: Secondary | ICD-10-CM | POA: Diagnosis not present

## 2017-01-20 DIAGNOSIS — E43 Unspecified severe protein-calorie malnutrition: Secondary | ICD-10-CM | POA: Diagnosis not present

## 2017-01-20 DIAGNOSIS — J181 Lobar pneumonia, unspecified organism: Secondary | ICD-10-CM | POA: Diagnosis not present

## 2017-01-20 NOTE — Patient Outreach (Signed)
[facility]) Care Management  01/20/2017  Cheryl MargaritaMary George Chavarin 1951-01-19 [id number]  Transition of care call.   Placed follow up transition of care call , no answer able to leave a hipaa compliant message requesting a return call.   Plan Will plan follow up call on next day, to complete transition of care initial call assessment.    Egbert GaribaldiKimberly Meta Kroenke, RN, [facility] Care Management [phone] Mobile [phone]- Toll Free Main Office

## 2017-01-21 ENCOUNTER — Encounter: Payer: Self-pay | Admitting: *Deleted

## 2017-01-21 ENCOUNTER — Telehealth: Payer: Self-pay | Admitting: Family Medicine

## 2017-01-21 ENCOUNTER — Other Ambulatory Visit: Payer: Self-pay | Admitting: *Deleted

## 2017-01-21 DIAGNOSIS — I110 Hypertensive heart disease with heart failure: Secondary | ICD-10-CM | POA: Diagnosis not present

## 2017-01-21 DIAGNOSIS — E43 Unspecified severe protein-calorie malnutrition: Secondary | ICD-10-CM | POA: Diagnosis not present

## 2017-01-21 DIAGNOSIS — F17219 Nicotine dependence, cigarettes, with unspecified nicotine-induced disorders: Secondary | ICD-10-CM | POA: Diagnosis not present

## 2017-01-21 DIAGNOSIS — I739 Peripheral vascular disease, unspecified: Secondary | ICD-10-CM | POA: Diagnosis not present

## 2017-01-21 DIAGNOSIS — G629 Polyneuropathy, unspecified: Secondary | ICD-10-CM | POA: Diagnosis not present

## 2017-01-21 DIAGNOSIS — J181 Lobar pneumonia, unspecified organism: Secondary | ICD-10-CM | POA: Diagnosis not present

## 2017-01-21 DIAGNOSIS — I509 Heart failure, unspecified: Secondary | ICD-10-CM | POA: Diagnosis not present

## 2017-01-21 DIAGNOSIS — J449 Chronic obstructive pulmonary disease, unspecified: Secondary | ICD-10-CM | POA: Diagnosis not present

## 2017-01-21 DIAGNOSIS — J45909 Unspecified asthma, uncomplicated: Secondary | ICD-10-CM | POA: Diagnosis not present

## 2017-01-21 NOTE — Patient Outreach (Signed)
[facility]) Care Management  01/21/2017  Cheryl [name] [birth year] [id number]   Transition of care call  Spoke with patient , reports she is doing "fine' this morning. Patient denies any shortness of breath, cough,wheezing or fever, states she has not had to use rescue inhaler since being at home. Patient continues to smoke, has oxygen for use mostly when she goes outside the home states she now has a new smaller portable oxygen tank.   Patient discussed home health physical therapy visited completed on yesterday, patient reports she has had a fall while in the shower since being a home but she didn't get hurt only her pride. Patient continues to use rolling walker at home.  Patient discussed her recent visit to dentist and she had her 3 remaining teeth pulled and now has a new full set of dentures. She states she is also taking an antibiotic after dental procedure amoxicillin . Patient reports her appetite is not good because she cannot chew some of the foods she usually can do to getting adjusted to new dentures.  Discussed patient diet and speech therapy recommendations at discharge from the hospital, patient reports her diet was changed when she went [facility], she couldn't eat that mashed up foods and she had them change her diet, reports she cannot use straws anymore she has to drink out of the cup. Discussed reason for caution with diet and her risk for aspiration and recent pneumonia. Patient reports her weight was back up to 96.5 when leaving [facility] up 3 pounds from her admission there.    Patient agreeable to home visit in the next week. Discussed with patient importance in scheduling visit with PCP , offered to assist declined stated she would do it today. Patient also discussed her visit with pain doctor on tomorrow and procedure in back to help with relieving pain .    Plan Will schedule home visit in the next week. Patient will schedule PCP post  discharge office visit Patient/family will notify MD of worsening of Pneumonia/COPD symptoms     Egbert GaribaldiKimberly Dashel Goines, RN, [facility] Care Management [phone]- Mobile [phone]- Toll Free Main Office

## 2017-01-21 NOTE — Telephone Encounter (Signed)
I called [name] from [facility] and advised her as below.

## 2017-01-21 NOTE — Telephone Encounter (Signed)
[name] with [facility] called saying Mrs. Cheryl [name] was leaving [facility] today and they want to know if they can continue care in her home.  Thanks Barth Kirkseri

## 2017-01-21 NOTE — Telephone Encounter (Signed)
Please review. Thanks!  

## 2017-01-21 NOTE — Telephone Encounter (Signed)
OK 

## 2017-01-22 ENCOUNTER — Telehealth: Payer: Self-pay | Admitting: Family Medicine

## 2017-01-22 ENCOUNTER — Encounter: Payer: Self-pay | Admitting: Pain Medicine

## 2017-01-22 ENCOUNTER — Ambulatory Visit (HOSPITAL_BASED_OUTPATIENT_CLINIC_OR_DEPARTMENT_OTHER): Payer: Medicare HMO | Admitting: Pain Medicine

## 2017-01-22 ENCOUNTER — Ambulatory Visit
Admission: RE | Admit: 2017-01-22 | Discharge: 2017-01-22 | Disposition: A | Discharge status: Home / Self Care | Payer: Medicare HMO | Source: Ambulatory Visit | Attending: Pain Medicine | Admitting: Pain Medicine

## 2017-01-22 VITALS — BP 154/78 | HR 86 | Temp 99.0°F | Resp 15 | Ht <= 58 in | Wt 96.0 lb

## 2017-01-22 DIAGNOSIS — M47816 Spondylosis without myelopathy or radiculopathy, lumbar region: Secondary | ICD-10-CM | POA: Diagnosis not present

## 2017-01-22 DIAGNOSIS — Z888 Allergy status to other drugs, medicaments and biological substances status: Secondary | ICD-10-CM | POA: Insufficient documentation

## 2017-01-22 DIAGNOSIS — M5441 Lumbago with sciatica, right side: Secondary | ICD-10-CM

## 2017-01-22 DIAGNOSIS — M1288 Other specific arthropathies, not elsewhere classified, other specified site: Secondary | ICD-10-CM

## 2017-01-22 DIAGNOSIS — Z981 Arthrodesis status: Secondary | ICD-10-CM | POA: Diagnosis not present

## 2017-01-22 DIAGNOSIS — Z885 Allergy status to narcotic agent status: Secondary | ICD-10-CM | POA: Diagnosis not present

## 2017-01-22 DIAGNOSIS — Z882 Allergy status to sulfonamides status: Secondary | ICD-10-CM | POA: Diagnosis not present

## 2017-01-22 DIAGNOSIS — M5442 Lumbago with sciatica, left side: Secondary | ICD-10-CM

## 2017-01-22 DIAGNOSIS — Z886 Allergy status to analgesic agent status: Secondary | ICD-10-CM | POA: Insufficient documentation

## 2017-01-22 DIAGNOSIS — M545 Low back pain: Secondary | ICD-10-CM | POA: Diagnosis present

## 2017-01-22 DIAGNOSIS — G894 Chronic pain syndrome: Secondary | ICD-10-CM | POA: Insufficient documentation

## 2017-01-22 DIAGNOSIS — M488X6 Other specified spondylopathies, lumbar region: Secondary | ICD-10-CM | POA: Insufficient documentation

## 2017-01-22 DIAGNOSIS — G8929 Other chronic pain: Secondary | ICD-10-CM

## 2017-01-22 MED ORDER — TRIAMCINOLONE ACETONIDE 40 MG/ML IJ SUSP
40.0000 mg | Freq: Once | INTRAMUSCULAR | Status: AC
  Administered 2017-01-22: 40 mg

## 2017-01-22 MED ORDER — MIDAZOLAM HCL 5 MG/5ML IJ SOLN
INTRAMUSCULAR | Status: AC
  Filled 2017-01-22: qty 5

## 2017-01-22 MED ORDER — ROPIVACAINE HCL 5 MG/ML IJ SOLN
5.0000 mL | Freq: Once | INTRAMUSCULAR | Status: AC
  Administered 2017-01-22: 5 mL via EPIDURAL

## 2017-01-22 MED ORDER — LACTATED RINGERS IV SOLN
1000.0000 mL | Freq: Once | INTRAVENOUS | Status: AC
  Administered 2017-01-22: 1000 mL via INTRAVENOUS

## 2017-01-22 MED ORDER — TRIAMCINOLONE ACETONIDE 40 MG/ML IJ SUSP
INTRAMUSCULAR | Status: AC
  Filled 2017-01-22: qty 2

## 2017-01-22 MED ORDER — FENTANYL CITRATE (PF) 100 MCG/2ML IJ SOLN
INTRAMUSCULAR | Status: AC
  Filled 2017-01-22: qty 2

## 2017-01-22 MED ORDER — ROPIVACAINE HCL 5 MG/ML IJ SOLN
INTRAMUSCULAR | Status: AC
  Filled 2017-01-22: qty 20

## 2017-01-22 MED ORDER — MIDAZOLAM HCL 5 MG/5ML IJ SOLN
1.0000 mg | INTRAMUSCULAR | Status: DC | PRN
  Administered 2017-01-22: 1 mg via INTRAVENOUS

## 2017-01-22 MED ORDER — FENTANYL CITRATE (PF) 100 MCG/2ML IJ SOLN
25.0000 ug | INTRAMUSCULAR | Status: DC | PRN
  Administered 2017-01-22: 25 ug via INTRAVENOUS

## 2017-01-22 MED ORDER — LIDOCAINE HCL (PF) 1 % IJ SOLN: 10.0000 mL | Freq: Once | INTRAMUSCULAR | Status: DC

## 2017-01-22 NOTE — Progress Notes (Signed)
Patient's Name: Cheryl [name]  MRN: [id number]  Referring Provider: Delano Metz, MD  DOB: 06-30-1951  PCP: Malva Limes, MD  DOS: 01/22/2017  Note by: Sydnee Levans. Laban Emperor, MD  Service setting: Ambulatory outpatient  Location: [facility] (AMB) Pain Management Facility  Visit type: Procedure  Specialty: Interventional Pain Management  Patient type: Established   Primary Reason for Visit: Interventional Pain Management Treatment. CC: Back Pain (lower, both sides)  Procedure:  Anesthesia, Analgesia, Anxiolysis:  Type: Diagnostic Medial Branch Facet Block Region: Lumbar Level: L2, L3, L4, L5, & S1 Medial Branch Level(s) Laterality: Bilateral  Type: Local Anesthesia with Moderate (Conscious) Sedation Local Anesthetic: Lidocaine 1% Route: Intravenous (IV) IV Access: Secured Sedation: Meaningful verbal contact was maintained at all times during the procedure  Indication(s): Analgesia and Anxiety  Indications: 1. Lumbar facet syndrome (Location of Primary Source of Pain) (Bilateral)   2. Chronic low back pain (Location of Primary Source of Pain) (Bilateral) (L>R)   3. Lumbar spondylosis   4. Chronic pain syndrome    Pain Score: Pre-procedure: 5 /10 Post-procedure: 0-No pain/10  Pre-op Assessment:  Previous date of service: 12/05/16 Service provided: Med Refill Cheryl [name] is a 66 y.o. (year old), female patient, seen today for interventional treatment. She  has a past surgical history that includes Appendectomy; Spine surgery; Foot surgery (Bilateral); cardiac catherization (10/31/2009); abdomnal aortic stent (05/30/2008); Abdominal hysterectomy (1975); Cholecystectomy (1972); Cervical fusion (C5 - 6/C6-7); Appendectomy; Colonoscopy with propofol (N/A, 07/27/2015); and Esophagogastroduodenoscopy (egd) with propofol (N/A, 07/27/2015). Her primarily concern today is the Back Pain (lower, both sides)  Initial Vital Signs: Blood pressure (!) 133/44, pulse 90, temperature 98.8 F (37.1 C),  temperature source Oral, resp. rate 16, height 4\' 10"  (1.473 m), weight 96 lb (43.5 kg), SpO2 97 %. BMI: 20.06 kg/m  Risk Assessment: Allergies: Reviewed. She is allergic to percocet [oxycodone-acetaminophen]; aspirin; codeine; propoxyphene; and sulfa antibiotics.  Allergy Precautions: None required Coagulopathies: "Reviewed. None identified.  Blood-thinner therapy: None at this time Active Infection(s): Reviewed. None identified. Cheryl [name] is afebrile  Site Confirmation: Cheryl [name] was asked to confirm the procedure and laterality before marking the site Procedure checklist: Completed Consent: Before the procedure and under the influence of no sedative(s), amnesic(s), or anxiolytics, the patient was informed of the treatment options, risks and possible complications. To fulfill our ethical and legal obligations, as recommended by the American Medical Association's Code of Ethics, I have informed the patient of my clinical impression; the nature and purpose of the treatment or procedure; the risks, benefits, and possible complications of the intervention; the alternatives, including doing nothing; the risk(s) and benefit(s) of the alternative treatment(s) or procedure(s); and the risk(s) and benefit(s) of doing nothing. The patient was provided information about the general risks and possible complications associated with the procedure. These may include, but are not limited to: failure to achieve desired goals, infection, bleeding, organ or nerve damage, allergic reactions, paralysis, and death. In addition, the patient was informed of those risks and complications associated to Spine-related procedures, such as failure to decrease pain; infection (i.e.: Meningitis, epidural or intraspinal abscess); bleeding (i.e.: epidural hematoma, subarachnoid hemorrhage, or any other type of intraspinal or peri-dural bleeding); organ or nerve damage (i.e.: Any type of peripheral nerve, nerve root, or spinal cord  injury) with subsequent damage to sensory, motor, and/or autonomic systems, resulting in permanent pain, numbness, and/or weakness of one or several areas of the body; allergic reactions; (i.e.: anaphylactic reaction); and/or death. Furthermore, the patient was informed of  those risks and complications associated with the medications. These include, but are not limited to: allergic reactions (i.e.: anaphylactic or anaphylactoid reaction(s)); adrenal axis suppression; blood sugar elevation that in diabetics may result in ketoacidosis or comma; water retention that in patients with history of congestive heart failure may result in shortness of breath, pulmonary edema, and decompensation with resultant heart failure; weight gain; swelling or edema; medication-induced neural toxicity; particulate matter embolism and blood vessel occlusion with resultant organ, and/or nervous system infarction; and/or aseptic necrosis of one or more joints. Finally, the patient was informed that Medicine is not an exact science; therefore, there is also the possibility of unforeseen or unpredictable risks and/or possible complications that may result in a catastrophic outcome. The patient indicated having understood very clearly. We have given the patient no guarantees and we have made no promises. Enough time was given to the patient to ask questions, all of which were answered to the patient's satisfaction. Ms. Bettes has indicated that she wanted to continue with the procedure. Attestation: I, the ordering provider, attest that I have discussed with the patient the benefits, risks, side-effects, alternatives, likelihood of achieving goals, and potential problems during recovery for the procedure that I have provided informed consent. Date: 01/22/2017; Time: 9:20 AM  Pre-Procedure Preparation:  Monitoring: As per clinic protocol. Respiration, ETCO2, SpO2, BP, heart rate and rhythm monitor placed and checked for adequate  function Safety Precautions: Patient was assessed for positional comfort and pressure points before starting the procedure. Time-out: I initiated and conducted the "Time-out" before starting the procedure, as per protocol. The patient was asked to participate by confirming the accuracy of the "Time Out" information. Verification of the correct person, site, and procedure were performed and confirmed by me, the nursing staff, and the patient. "Time-out" conducted as per Joint Commission's Universal Protocol (UP.01.01.01). "Time-out" Date & Time: 01/22/2017; 0938 hrs.  Description of Procedure Process:   Position: Prone Target Area: For Lumbar Facet blocks, the target is the groove formed by the junction of the transverse process and superior articular process. For the L5 dorsal ramus, the target is the notch between superior articular process and sacral ala. For the S1 dorsal ramus, the target is the superior and lateral edge of the posterior S1 Sacral foramen. Approach: Paramedial approach. Area Prepped: Entire Posterior Lumbosacral Region Prepping solution: ChloraPrep (2% chlorhexidine gluconate and 70% isopropyl alcohol) Safety Precautions: Aspiration looking for blood return was conducted prior to all injections. At no point did we inject any substances, as a needle was being advanced. No attempts were made at seeking any paresthesias. Safe injection practices and needle disposal techniques used. Medications properly checked for expiration dates. SDV (single dose vial) medications used. Description of the Procedure: Protocol guidelines were followed. The patient was placed in position over the fluoroscopy table. The target area was identified and the area prepped in the usual manner. Skin desensitized using vapocoolant spray. Skin & deeper tissues infiltrated with local anesthetic. Appropriate amount of time allowed to pass for local anesthetics to take effect. The procedure needle was introduced  through the skin, ipsilateral to the reported pain, and advanced to the target area. Employing the "Medial Branch Technique", the needles were advanced to the angle made by the superior and medial portion of the transverse process, and the lateral and inferior portion of the superior articulating process of the targeted vertebral bodies. This area is known as "[place]'s Eye" or the "Eye of the [place] Dog". A procedure needle was introduced through  the skin, and this time advanced to the angle made by the superior and medial border of the sacral ala, and the lateral border of the S1 vertebral body. This last needle was later repositioned at the superior and lateral border of the posterior S1 foramen. Negative aspiration confirmed. Solution injected in intermittent fashion, asking for systemic symptoms every 0.5cc of injectate. The needles were then removed and the area cleansed, making sure to leave some of the prepping solution back to take advantage of its long term bactericidal properties. Vitals:   01/22/17 0959 01/22/17 1004 01/22/17 1014 01/22/17 1024  BP: (!) 144/70 (!) 149/77 (!) 156/77 (!) 154/78  Pulse:  87 86 86  Resp: 15 14 16 15   Temp: 99 F (37.2 C)     TempSrc: Temporal     SpO2: 99% 96% 100% 100%  Weight:      Height:        Start Time: 0938 hrs. End Time: 0948 hrs.  Illustration of the posterior view of the lumbar spine and the posterior neural structures. Laminae of L2 through S1 are labeled. DPRL5, dorsal primary ramus of L5; DPRS1, dorsal primary ramus of S1; DPR3, dorsal primary ramus of L3; FJ, facet (zygapophyseal) joint L3-L4; I, inferior articular process of L4; LB1, lateral branch of dorsal primary ramus of L1; IAB, inferior articular branches from L3 medial branch (supplies L4-L5 facet joint); IBP, intermediate branch plexus; MB3, medial branch of dorsal primary ramus of L3; NR3, third lumbar nerve root; S, superior articular process of L5; SAB, superior articular branches  from L4 (supplies L4-5 facet joint also); TP3, transverse process of L3.  Materials:  Needle(s) Type: Regular needle Gauge: 22G Length: 3.5-in Medication(s): We administered fentaNYL, lactated ringers, midazolam, triamcinolone acetonide, triamcinolone acetonide, ropivacaine (PF) 5 mg/mL (0.5%), and ropivacaine (PF) 5 mg/mL (0.5%). Please see chart orders for dosing details.  Imaging Guidance (Spinal):  Type of Imaging Technique: Fluoroscopy Guidance (Spinal) Indication(s): Assistance in needle guidance and placement for procedures requiring needle placement in or near specific anatomical locations not easily accessible without such assistance. Exposure Time: Please see nurses notes. Contrast: None used. Fluoroscopic Guidance: I was personally present during the use of fluoroscopy. "Tunnel Vision Technique" used to obtain the best possible view of the target area. Parallax error corrected before commencing the procedure. "Direction-depth-direction" technique used to introduce the needle under continuous pulsed fluoroscopy. Once target was reached, antero-posterior, oblique, and lateral fluoroscopic projection used confirm needle placement in all planes. Images permanently stored in EMR. Interpretation: No contrast injected. I personally interpreted the imaging intraoperatively. Adequate needle placement confirmed in multiple planes. Permanent images saved into the patient's record.  Antibiotic Prophylaxis:  Indication(s): None identified Antibiotic given: None  Post-operative Assessment:  EBL: None Complications: No immediate post-treatment complications observed by team, or reported by patient. Note: The patient tolerated the entire procedure well. A repeat set of vitals were taken after the procedure and the patient was kept under observation following institutional policy, for this type of procedure. Post-procedural neurological assessment was performed, showing return to baseline, prior to  discharge. The patient was provided with post-procedure discharge instructions, including a section on how to identify potential problems. Should any problems arise concerning this procedure, the patient was given instructions to immediately contact us, at any time, without hesitation. In any case, we plan to contact the patient by telephone for a follow-up status report regarding this interventional procedure. Comments:  No additional relevant information.  Plan of Care  Disposition: Discharge home  Discharge Date & Time: 01/22/2017; 1024 hrs.  Physician-requested Follow-up:  Return in about 2 weeks (around 02/05/2017) for Post-Procedure evaluation.  Future Appointments Date Time Provider Department Center  01/28/2017 4:00 PM Malva Limesonald E Fisher, MD [facility] None  01/31/2017 10:00 AM Georgette DoverKimberly A Glover, RN [facility] None  02/05/2017 11:30 AM Delano MetzFrancisco Cheyan Frees, MD [facility] None  03/06/2017 1:30 PM Delano MetzFrancisco Jadien Lehigh, MD [facility] None   Medications ordered for procedure: Meds ordered this encounter  Medications  . fentaNYL (SUBLIMAZE) injection 25-50 mcg    Make sure Narcan is available in the pyxis when using this medication. In the event of respiratory depression (RR< 8/min): Titrate NARCAN (naloxone) in increments of 0.1 to 0.2 mg IV at 2-3 minute intervals, until desired degree of reversal.  . lactated ringers infusion 1,000 mL  . midazolam (VERSED) 5 MG/5ML injection 1-2 mg    Make sure Flumazenil is available in the pyxis when using this medication. If oversedation occurs, administer 0.2 mg IV over 15 sec. If after 45 sec no response, administer 0.2 mg again over 1 min; may repeat at 1 min intervals; not to exceed 4 doses (1 mg)  . triamcinolone acetonide (KENALOG-40) injection 40 mg  . lidocaine (PF) (XYLOCAINE) 1 % injection 10 mL  . triamcinolone acetonide (KENALOG-40) injection 40 mg  . ropivacaine (PF) 5 mg/mL (0.5%) (NAROPIN) injection 5 mL  . ropivacaine (PF) 5 mg/mL (0.5%) (NAROPIN)  injection 5 mL   Medications administered: We administered fentaNYL, lactated ringers, midazolam, triamcinolone acetonide, triamcinolone acetonide, ropivacaine (PF) 5 mg/mL (0.5%), and ropivacaine (PF) 5 mg/mL (0.5%).  See the medical record for exact dosing, route, and time of administration.  Lab-work, Procedure(s), & Referral(s) Ordered: Orders Placed This Encounter  Procedures  . DG C-Arm 1-60 Min-No Report  . Discharge instructions  . Follow-up  . Informed Consent Details: Transcribe to consent form and obtain patient signature  . Provider attestation of informed consent for procedure/surgical case  . Verify informed consent   Imaging Ordered: No results found for this or any previous visit. New Prescriptions   No medications on file   Primary Care Physician: Malva Limesonald E Fisher, MD [facility] Note by: Sydnee LevansFrancisco A. Laban EmperorNaveira, M.D, DABA, DABAPM, DABPM, DABIPP, FIPP Date: 01/22/2017; Time: 11:10 AM  Disclaimer:  Medicine is not an exact science. The only guarantee in medicine is that nothing is guaranteed. It is important to note that the decision to proceed with this intervention was based on the information collected from the patient. The Data and conclusions were drawn from the patient's questionnaire, the interview, and the physical examination. Because the information was provided in large part by the patient, it cannot be guaranteed that it has not been purposely or unconsciously manipulated. Every effort has been made to obtain as much relevant data as possible for this evaluation. It is important to note that the conclusions that lead to this procedure are derived in large part from the available data. Always take into account that the treatment will also be dependent on availability of resources and existing treatment guidelines, considered by other Pain Management Practitioners as being common knowledge and practice, at the time of the  intervention. For Medico-Legal purposes, it is also important to point out that variation in procedural techniques and pharmacological choices are the acceptable norm. The indications, contraindications, technique, and results of the above procedure should only be interpreted and judged by a Board-Certified Interventional Pain Specialist with extensive familiarity and expertise in the same exact procedure  and technique. Attempts at providing opinions without similar or greater experience and expertise than that of the treating physician will be considered as inappropriate and unethical, and shall result in a formal complaint to the state medical board and applicable specialty societies.  Instructions provided at this appointment: Patient Instructions   Pain Score  Introduction: The pain score used by this practice is the Verbal Numerical Rating Scale (VNRS-11). This is an 11-point scale. It is for adults and children 10 years or older. There are significant differences in how the pain score is reported, used, and applied. Forget everything you learned in the past and learn this scoring system.  General Information: The scale should reflect your current level of pain. Unless you are specifically asked for the level of your worst pain, or your average pain. If you are asked for one of these two, then it should be understood that it is over the past 24 hours.  Basic Activities of Daily Living (ADL): Personal hygiene, dressing, eating, transferring, and using restroom.  Instructions: Most patients tend to report their level of pain as a combination of two factors, their physical pain and their psychosocial pain. This last one is also known as "suffering" and it is reflection of how physical pain affects you socially and psychologically. From now on, report them separately. From this point on, when asked to report your pain level, report only your physical pain. Use the following table for reference.  Pain  Clinic Pain Levels (0-5/10)  Pain Level Score Description  No Pain 0   Mild pain 1 Nagging, annoying, but does not interfere with basic activities of daily living (ADL). Patients are able to eat, bathe, get dressed, toileting (being able to get on and off the toilet and perform personal hygiene functions), transfer (move in and out of bed or a chair without assistance), and maintain continence (able to control bladder and bowel functions). Blood pressure and heart rate are unaffected. A normal heart rate for a healthy adult ranges from 60 to 100 bpm (beats per minute).   Mild to moderate pain 2 Noticeable and distracting. Impossible to hide from other people. More frequent flare-ups. Still possible to adapt and function close to normal. It can be very annoying and may have occasional stronger flare-ups. With discipline, patients may get used to it and adapt.   Moderate pain 3 Interferes significantly with activities of daily living (ADL). It becomes difficult to feed, bathe, get dressed, get on and off the toilet or to perform personal hygiene functions. Difficult to get in and out of bed or a chair without assistance. Very distracting. With effort, it can be ignored when deeply involved in activities.   Moderately severe pain 4 Impossible to ignore for more than a few minutes. With effort, patients may still be able to manage work or participate in some social activities. Very difficult to concentrate. Signs of autonomic nervous system discharge are evident: dilated pupils (mydriasis); mild sweating (diaphoresis); sleep interference. Heart rate becomes elevated (>115 bpm). Diastolic blood pressure (lower number) rises above 100 mmHg. Patients find relief in laying down and not moving.   Severe pain 5 Intense and extremely unpleasant. Associated with frowning face and frequent crying. Pain overwhelms the senses.  Ability to do any activity or maintain social relationships becomes significantly limited.  Conversation becomes difficult. Pacing back and forth is common, as getting into a comfortable position is nearly impossible. Pain wakes you up from deep sleep. Physical signs will be obvious: pupillary  dilation; increased sweating; goosebumps; brisk reflexes; cold, clammy hands and feet; nausea, vomiting or dry heaves; loss of appetite; significant sleep disturbance with inability to fall asleep or to remain asleep. When persistent, significant weight loss is observed due to the complete loss of appetite and sleep deprivation.  Blood pressure and heart rate becomes significantly elevated. Caution: If elevated blood pressure triggers a pounding headache associated with blurred vision, then the patient should immediately seek attention at an urgent or emergency care unit, as these may be signs of an impending stroke.    Emergency Department Pain Levels (6-10/10)  Emergency Room Pain 6 Severely limiting. Requires emergency care and should not be seen or managed at an outpatient pain management facility. Communication becomes difficult and requires great effort. Assistance to reach the emergency department may be required. Facial flushing and profuse sweating along with potentially dangerous increases in heart rate and blood pressure will be evident.   Distressing pain 7 Self-care is very difficult. Assistance is required to transport, or use restroom. Assistance to reach the emergency department will be required. Tasks requiring coordination, such as bathing and getting dressed become very difficult.   Disabling pain 8 Self-care is no longer possible. At this level, pain is disabling. The individual is unable to do even the most "basic" activities such as walking, eating, bathing, dressing, transferring to a bed, or toileting. Fine motor skills are lost. It is difficult to think clearly.   Incapacitating pain 9 Pain becomes incapacitating. Thought processing is no longer possible. Difficult to remember your  own name. Control of movement and coordination are lost.   The worst pain imaginable 10 At this level, most patients pass out from pain. When this level is reached, collapse of the autonomic nervous system occurs, leading to a sudden drop in blood pressure and heart rate. This in turn results in a temporary and dramatic drop in blood flow to the brain, leading to a loss of consciousness. Fainting is one of the body's self defense mechanisms. Passing out puts the brain in a calmed state and causes it to shut down for a while, in order to begin the healing process.    Summary: 1. Refer to this scale when providing [place] with your pain level. 2. Be accurate and careful when reporting your pain level. This will help with your care. 3. Over-reporting your pain level will lead to loss of credibility. 4. Even a level of 1/10 means that there is pain and will be treated at our facility. 5. High, inaccurate reporting will be documented as "Symptom Exaggeration", leading to loss of credibility and suspicions of possible secondary gains such as obtaining more narcotics, or wanting to appear disabled, for fraudulent reasons. 6. Only pain levels of 5 or below will be seen at our facility. 7. Pain levels of 6 and above will be sent to the Emergency Department and the appointment cancelled. _____________________________________________________________________________________________  Pain Management Discharge Instructions  General Discharge Instructions :  If you need to reach your doctor call: Monday-Friday 8:00 am - 4:00 pm at [phone] or toll free [phone].  After clinic hours [phone] to have operator reach doctor.  Bring all of your medication bottles to all your appointments in the pain clinic.  To cancel or reschedule your appointment with Pain Management please remember to call 24 hours in advance to avoid a fee.  Refer to the educational materials which you have been given on: General  Risks, I had my Procedure. Discharge Instructions, Post Sedation.  Post Procedure Instructions:  The drugs you were given will stay in your system until tomorrow, so for the next 24 hours you should not drive, make any legal decisions or drink any alcoholic beverages.  You may eat anything you prefer, but it is better to start with liquids then soups and crackers, and gradually work up to solid foods.  Please notify your doctor immediately if you have any unusual bleeding, trouble breathing or pain that is not related to your normal pain.  Depending on the type of procedure that was done, some parts of your body may feel week and/or numb.  This usually clears up by tonight or the next day.  Walk with the use of an assistive device or accompanied by an adult for the 24 hours.  You may use ice on the affected area for the first 24 hours.  Put ice in a Ziploc bag and cover with a towel and place against area 15 minutes on 15 minutes off.  You may switch to heat after 24 hours.

## 2017-01-22 NOTE — Telephone Encounter (Signed)
Called Pt to schedule AWV with [facility] - knb °

## 2017-01-22 NOTE — Patient Instructions (Addendum)
Pain Score  Introduction: The pain score used by this practice is the Verbal Numerical Rating Scale (VNRS-11). This is an 11-point scale. It is for adults and children 10 years or older. There are significant differences in how the pain score is reported, used, and applied. Forget everything you learned in the past and learn this scoring system.  General Information: The scale should reflect your current level of pain. Unless you are specifically asked for the level of your worst pain, or your average pain. If you are asked for one of these two, then it should be understood that it is over the past 24 hours.  Basic Activities of Daily Living (ADL): Personal hygiene, dressing, eating, transferring, and using restroom.  Instructions: Most patients tend to report their level of pain as a combination of two factors, their physical pain and their psychosocial pain. This last one is also known as "suffering" and it is reflection of how physical pain affects you socially and psychologically. From now on, report them separately. From this point on, when asked to report your pain level, report only your physical pain. Use the following table for reference.  Pain Clinic Pain Levels (0-5/10)  Pain Level Score Description  No Pain 0   Mild pain 1 Nagging, annoying, but does not interfere with basic activities of daily living (ADL). Patients are able to eat, bathe, get dressed, toileting (being able to get on and off the toilet and perform personal hygiene functions), transfer (move in and out of bed or a chair without assistance), and maintain continence (able to control bladder and bowel functions). Blood pressure and heart rate are unaffected. A normal heart rate for a healthy adult ranges from 60 to 100 bpm (beats per minute).   Mild to moderate pain 2 Noticeable and distracting. Impossible to hide from other people. More frequent flare-ups. Still possible to adapt and function close to normal. It can be very  annoying and may have occasional stronger flare-ups. With discipline, patients may get used to it and adapt.   Moderate pain 3 Interferes significantly with activities of daily living (ADL). It becomes difficult to feed, bathe, get dressed, get on and off the toilet or to perform personal hygiene functions. Difficult to get in and out of bed or a chair without assistance. Very distracting. With effort, it can be ignored when deeply involved in activities.   Moderately severe pain 4 Impossible to ignore for more than a few minutes. With effort, patients may still be able to manage work or participate in some social activities. Very difficult to concentrate. Signs of autonomic nervous system discharge are evident: dilated pupils (mydriasis); mild sweating (diaphoresis); sleep interference. Heart rate becomes elevated (>115 bpm). Diastolic blood pressure (lower number) rises above 100 mmHg. Patients find relief in laying down and not moving.   Severe pain 5 Intense and extremely unpleasant. Associated with frowning face and frequent crying. Pain overwhelms the senses.  Ability to do any activity or maintain social relationships becomes significantly limited. Conversation becomes difficult. Pacing back and forth is common, as getting into a comfortable position is nearly impossible. Pain wakes you up from deep sleep. Physical signs will be obvious: pupillary dilation; increased sweating; goosebumps; brisk reflexes; cold, clammy hands and feet; nausea, vomiting or dry heaves; loss of appetite; significant sleep disturbance with inability to fall asleep or to remain asleep. When persistent, significant weight loss is observed due to the complete loss of appetite and sleep deprivation.  Blood pressure and heart   rate becomes significantly elevated. Caution: If elevated blood pressure triggers a pounding headache associated with blurred vision, then the patient should immediately seek attention at an urgent or  emergency care unit, as these may be signs of an impending stroke.    Emergency Department Pain Levels (6-10/10)  Emergency Room Pain 6 Severely limiting. Requires emergency care and should not be seen or managed at an outpatient pain management facility. Communication becomes difficult and requires great effort. Assistance to reach the emergency department may be required. Facial flushing and profuse sweating along with potentially dangerous increases in heart rate and blood pressure will be evident.   Distressing pain 7 Self-care is very difficult. Assistance is required to transport, or use restroom. Assistance to reach the emergency department will be required. Tasks requiring coordination, such as bathing and getting dressed become very difficult.   Disabling pain 8 Self-care is no longer possible. At this level, pain is disabling. The individual is unable to do even the most "basic" activities such as walking, eating, bathing, dressing, transferring to a bed, or toileting. Fine motor skills are lost. It is difficult to think clearly.   Incapacitating pain 9 Pain becomes incapacitating. Thought processing is no longer possible. Difficult to remember your own name. Control of movement and coordination are lost.   The worst pain imaginable 10 At this level, most patients pass out from pain. When this level is reached, collapse of the autonomic nervous system occurs, leading to a sudden drop in blood pressure and heart rate. This in turn results in a temporary and dramatic drop in blood flow to the brain, leading to a loss of consciousness. Fainting is one of the body's self defense mechanisms. Passing out puts the brain in a calmed state and causes it to shut down for a while, in order to begin the healing process.    Summary: 1. Refer to this scale when providing us with your pain level. 2. Be accurate and careful when reporting your pain level. This will help with your care. 3. Over-reporting  your pain level will lead to loss of credibility. 4. Even a level of 1/10 means that there is pain and will be treated at our facility. 5. High, inaccurate reporting will be documented as "Symptom Exaggeration", leading to loss of credibility and suspicions of possible secondary gains such as obtaining more narcotics, or wanting to appear disabled, for fraudulent reasons. 6. Only pain levels of 5 or below will be seen at our facility. 7. Pain levels of 6 and above will be sent to the Emergency Department and the appointment cancelled. _____________________________________________________________________________________________  Pain Management Discharge Instructions  General Discharge Instructions :  If you need to reach your doctor call: Monday-Friday 8:00 am - 4:00 pm at [phone] or toll free [phone].  After clinic hours [phone] to have operator reach doctor.  Bring all of your medication bottles to all your appointments in the pain clinic.  To cancel or reschedule your appointment with Pain Management please remember to call 24 hours in advance to avoid a fee.  Refer to the educational materials which you have been given on: General Risks, I had my Procedure. Discharge Instructions, Post Sedation.  Post Procedure Instructions:  The drugs you were given will stay in your system until tomorrow, so for the next 24 hours you should not drive, make any legal decisions or drink any alcoholic beverages.  You may eat anything you prefer, but it is better to start with liquids then soups and   crackers, and gradually work up to solid foods.  Please notify your doctor immediately if you have any unusual bleeding, trouble breathing or pain that is not related to your normal pain.  Depending on the type of procedure that was done, some parts of your body may feel week and/or numb.  This usually clears up by tonight or the next day.  Walk with the use of an assistive device or  accompanied by an adult for the 24 hours.  You may use ice on the affected area for the first 24 hours.  Put ice in a Ziploc bag and cover with a towel and place against area 15 minutes on 15 minutes off.  You may switch to heat after 24 hours. 

## 2017-01-22 NOTE — Progress Notes (Signed)
Safety precautions to be maintained throughout the outpatient stay will include: orient to surroundings, keep bed in low position, maintain call bell within reach at all times, provide assistance with transfer out of bed and ambulation.  

## 2017-01-23 ENCOUNTER — Telehealth: Payer: Self-pay | Admitting: *Deleted

## 2017-01-23 DIAGNOSIS — F17219 Nicotine dependence, cigarettes, with unspecified nicotine-induced disorders: Secondary | ICD-10-CM | POA: Diagnosis not present

## 2017-01-23 DIAGNOSIS — G629 Polyneuropathy, unspecified: Secondary | ICD-10-CM | POA: Diagnosis not present

## 2017-01-23 DIAGNOSIS — J181 Lobar pneumonia, unspecified organism: Secondary | ICD-10-CM | POA: Diagnosis not present

## 2017-01-23 DIAGNOSIS — J449 Chronic obstructive pulmonary disease, unspecified: Secondary | ICD-10-CM | POA: Diagnosis not present

## 2017-01-23 DIAGNOSIS — I739 Peripheral vascular disease, unspecified: Secondary | ICD-10-CM | POA: Diagnosis not present

## 2017-01-23 DIAGNOSIS — J45909 Unspecified asthma, uncomplicated: Secondary | ICD-10-CM | POA: Diagnosis not present

## 2017-01-23 DIAGNOSIS — I509 Heart failure, unspecified: Secondary | ICD-10-CM | POA: Diagnosis not present

## 2017-01-23 DIAGNOSIS — E43 Unspecified severe protein-calorie malnutrition: Secondary | ICD-10-CM | POA: Diagnosis not present

## 2017-01-23 DIAGNOSIS — I110 Hypertensive heart disease with heart failure: Secondary | ICD-10-CM | POA: Diagnosis not present

## 2017-01-23 NOTE — Telephone Encounter (Signed)
Denies problems post procedure. 

## 2017-01-24 DIAGNOSIS — G629 Polyneuropathy, unspecified: Secondary | ICD-10-CM | POA: Diagnosis not present

## 2017-01-24 DIAGNOSIS — F17219 Nicotine dependence, cigarettes, with unspecified nicotine-induced disorders: Secondary | ICD-10-CM | POA: Diagnosis not present

## 2017-01-24 DIAGNOSIS — I110 Hypertensive heart disease with heart failure: Secondary | ICD-10-CM | POA: Diagnosis not present

## 2017-01-24 DIAGNOSIS — E43 Unspecified severe protein-calorie malnutrition: Secondary | ICD-10-CM | POA: Diagnosis not present

## 2017-01-24 DIAGNOSIS — J449 Chronic obstructive pulmonary disease, unspecified: Secondary | ICD-10-CM | POA: Diagnosis not present

## 2017-01-24 DIAGNOSIS — I509 Heart failure, unspecified: Secondary | ICD-10-CM | POA: Diagnosis not present

## 2017-01-24 DIAGNOSIS — J181 Lobar pneumonia, unspecified organism: Secondary | ICD-10-CM | POA: Diagnosis not present

## 2017-01-24 DIAGNOSIS — J45909 Unspecified asthma, uncomplicated: Secondary | ICD-10-CM | POA: Diagnosis not present

## 2017-01-24 DIAGNOSIS — I739 Peripheral vascular disease, unspecified: Secondary | ICD-10-CM | POA: Diagnosis not present

## 2017-01-27 DIAGNOSIS — E43 Unspecified severe protein-calorie malnutrition: Secondary | ICD-10-CM | POA: Diagnosis not present

## 2017-01-27 DIAGNOSIS — G629 Polyneuropathy, unspecified: Secondary | ICD-10-CM | POA: Diagnosis not present

## 2017-01-27 DIAGNOSIS — M810 Age-related osteoporosis without current pathological fracture: Secondary | ICD-10-CM | POA: Diagnosis not present

## 2017-01-27 DIAGNOSIS — I509 Heart failure, unspecified: Secondary | ICD-10-CM | POA: Diagnosis not present

## 2017-01-27 DIAGNOSIS — M47816 Spondylosis without myelopathy or radiculopathy, lumbar region: Secondary | ICD-10-CM | POA: Diagnosis not present

## 2017-01-27 DIAGNOSIS — I739 Peripheral vascular disease, unspecified: Secondary | ICD-10-CM | POA: Diagnosis not present

## 2017-01-27 DIAGNOSIS — J181 Lobar pneumonia, unspecified organism: Secondary | ICD-10-CM | POA: Diagnosis not present

## 2017-01-27 DIAGNOSIS — J440 Chronic obstructive pulmonary disease with acute lower respiratory infection: Secondary | ICD-10-CM | POA: Diagnosis not present

## 2017-01-27 DIAGNOSIS — I110 Hypertensive heart disease with heart failure: Secondary | ICD-10-CM | POA: Diagnosis not present

## 2017-01-28 ENCOUNTER — Ambulatory Visit
Admission: RE | Admit: 2017-01-28 | Discharge: 2017-01-28 | Disposition: A | Discharge status: Home / Self Care | Payer: Medicare HMO | Source: Ambulatory Visit | Attending: Family Medicine | Admitting: Family Medicine

## 2017-01-28 ENCOUNTER — Ambulatory Visit (INDEPENDENT_AMBULATORY_CARE_PROVIDER_SITE_OTHER): Payer: Medicare HMO | Admitting: Family Medicine

## 2017-01-28 ENCOUNTER — Encounter: Payer: Self-pay | Admitting: Family Medicine

## 2017-01-28 VITALS — BP 132/70 | HR 80 | Temp 98.4°F | Wt 98.0 lb

## 2017-01-28 DIAGNOSIS — J449 Chronic obstructive pulmonary disease, unspecified: Secondary | ICD-10-CM | POA: Diagnosis not present

## 2017-01-28 DIAGNOSIS — J189 Pneumonia, unspecified organism: Secondary | ICD-10-CM

## 2017-01-28 DIAGNOSIS — I700 Atherosclerosis of aorta: Secondary | ICD-10-CM | POA: Insufficient documentation

## 2017-01-28 DIAGNOSIS — R05 Cough: Secondary | ICD-10-CM | POA: Diagnosis not present

## 2017-01-28 MED ORDER — BENZONATATE 100 MG PO CAPS: 100.0000 mg | ORAL_CAPSULE | Freq: Three times a day (TID) | ORAL | 1 refills | Status: DC | PRN

## 2017-01-28 NOTE — Progress Notes (Signed)
Patient: Cheryl [name] Female    DOB: 1951-01-13   66 y.o.   MRN: [id number] Visit Date: 01/28/2017  Today's Provider: Mila Merryonald Marikay Roads, MD   Chief Complaint  Patient presents with  . Transitions Of Care   Subjective:    HPI Transition of Care:  Patient was was seen and [facility] 12/23/2016 for Recurrent pneumonia. She was discharged from the hospital on 12/25/2016, and was sent to [facility]. Patient was seen back in the ER or 01/05/2017 for fever and chills. Patient declined admission and was treated with Levaquin and discharged to [facility] nursing [facility]. Patient was recently released from the nursing home and is here to follow up after discharge. Patient comes in today reporting that she still has shortness of breath with exertion. These symptoms improve with the use of her inhalers and oxygen. Patient denies any fever or chills. Patient still has a productive cough with yellow phlegm. She reports yesterday she had an episode of severe heartburn that caused her to have nausea and abdominal pain. Patient tried taking Tums with no relief. Patient states symptoms improved after laying down.  Allergies  Allergen Reactions  . Percocet [Oxycodone-Acetaminophen] Hives and Rash  . Aspirin Nausea And Vomiting and Other (See Comments)    Reaction:  GI upset   . Codeine Nausea And Vomiting and Other (See Comments)    Reaction:  GI upset   . Propoxyphene Other (See Comments) and Nausea Only    GI upset Reaction:  GI upset   . Sulfa Antibiotics Rash and Other (See Comments)    Reaction:  GI upset      Current Outpatient Prescriptions:  .  acetaminophen (TYLENOL) 325 MG tablet, Take 2 tablets (650 mg total) by mouth every 6 (six) hours as needed for mild pain (or Fever >/= 101)., Disp: 30 tablet, Rfl: 0 .  ADVAIR DISKUS 250-50 MCG/DOSE AEPB, inhale 1 dose by mouth twice a day, Disp: 60 each, Rfl: 11 .  albuterol (PROVENTIL HFA;VENTOLIN HFA) 108 (90 BASE) MCG/ACT  inhaler, Inhale 2 puffs into the lungs every 4 (four) hours as needed for wheezing or shortness of breath., Disp: 18 g, Rfl: 3 .  albuterol (PROVENTIL) (2.5 MG/3ML) 0.083% nebulizer solution, Take 3 mLs (2.5 mg total) by nebulization every 4 (four) hours as needed for wheezing., Disp: 75 mL, Rfl: 12 .  ALPRAZolam (XANAX) 0.5 MG tablet, , Disp: , Rfl:  .  celecoxib (CELEBREX) 100 MG capsule, Take 1 capsule (100 mg total) by mouth 2 (two) times daily as needed., Disp: 60 capsule, Rfl: 1 .  clopidogrel (PLAVIX) 75 MG tablet, Take 75 mg by mouth daily., Disp: , Rfl:  .  diphenoxylate-atropine (LOMOTIL) 2.5-0.025 MG per tablet, Take 2 tablets by mouth 4 (four) times daily as needed for diarrhea or loose stools. Reported on 03/11/2016, Disp: , Rfl:  .  donepezil (ARICEPT) 10 MG tablet, Take 10 mg by mouth at bedtime., Disp: , Rfl:  .  estropipate (OGEN) 0.75 MG tablet, Take 1 tablet (0.75 mg total) by mouth every other day., Disp: 30 tablet, Rfl: 3 .  fentaNYL (DURAGESIC - DOSED MCG/HR) 50 MCG/HR, , Disp: , Rfl:  .  fluticasone (FLONASE) 50 MCG/ACT nasal spray, Place 2 sprays into both nostrils daily., Disp: 16 g, Rfl: 1 .  furosemide (LASIX) 40 MG tablet, , Disp: , Rfl:  .  LYRICA 150 MG capsule, , Disp: , Rfl:  .  mupirocin ointment (BACTROBAN) 2 %,  Place 1 application into the nose 2 (two) times daily. (Patient taking differently: Place 1 application into the nose 2 (two) times daily. Right lower leg twice daily), Disp: 22 g, Rfl: 0 .  pantoprazole (PROTONIX) 40 MG tablet, take 1 tablet by mouth twice a day, Disp: 60 tablet, Rfl: 6 .  simvastatin (ZOCOR) 10 MG tablet, Take 10 mg by mouth at bedtime. Reported on 12/04/2015, Disp: , Rfl:  .  SPIRIVA HANDIHALER 18 MCG inhalation capsule, inhale the contents of one capsule in the handihaler once daily, Disp: 30 capsule, Rfl: 12 .  SUMAtriptan (IMITREX) 25 MG tablet, Take 25 mg by mouth as needed for migraine. May repeat in 2 hours if headache persists or  recurs., Disp: , Rfl:  .  traMADol (ULTRAM) 50 MG tablet, take 2 tablets by mouth every 6 hours if needed for MODERATE OR SEVERE pain (MAY FILL 1/25), Disp: , Rfl: 0 .  triamcinolone ointment (KENALOG) 0.5 %, Apply to lesions on hands twice a day as needed, Disp: 30 g, Rfl: 1 .  pregabalin (LYRICA) 75 MG capsule, Take 1 capsule (75 mg total) by mouth 3 (three) times daily., Disp: 90 capsule, Rfl: 0  Review of Systems  Constitutional: Negative for appetite change, chills, fatigue and fever.  Respiratory: Positive for cough and shortness of breath. Negative for chest tightness.   Cardiovascular: Negative for chest pain and palpitations.  Gastrointestinal: Positive for abdominal pain and nausea. Negative for vomiting.       Severe heartburn yesterday  Neurological: Negative for dizziness and weakness.    Social History  Substance Use Topics  . Smoking status: Current Every Day Smoker    Packs/day: 0.50    Years: 50.00    Types: Cigarettes  . Smokeless tobacco: Never Used     Comment: Previously smoked 2 ppd  . Alcohol use No   Objective:   BP 132/70 (BP Location: Left Arm, Patient Position: Sitting, Cuff Size: Normal)   Pulse 80   Temp 98.4 F (36.9 C) (Oral)   Wt 98 lb (44.5 kg)   SpO2 98% Comment: room air  BMI 20.48 kg/m     Physical Exam   General Appearance:    Alert, cooperative, no distress, frail in no acute distress.   Eyes:    PERRL, conjunctiva/corneas clear, EOM's intact       Lungs:     Clear to auscultation bilaterally, respirations unlabored  Heart:    Regular rate and rhythm  Neurologic:   Awake, alert, oriented x 3. No apparent focal neurological           defect.           Assessment & Plan:     1. Chronic obstructive pulmonary disease, unspecified COPD type (HCC) Multiple admission this year for pneumonia and COPD exacerbation. Still using O2 with exertion. Needs to be followed by pulmonary.  - Ambulatory referral to Pulmonology - DG Chest 2 View;  Future  2. Pneumonia due to infectious organism, unspecified laterality, unspecified part of lung Greatly improved, but still coughing. Repeat chest XR today to ensure clearing.  - DG Chest 2 View; Future     The entirety of the information documented in the History of Present Illness, Review of Systems and Physical Exam were personally obtained by me. Portions of this information were initially documented by Awilda Bill, CMA and reviewed by me for thoroughness and accuracy.    Mila Merry, MD  [facility]

## 2017-01-29 ENCOUNTER — Telehealth: Payer: Self-pay

## 2017-01-29 NOTE — Telephone Encounter (Signed)
Patient advised.

## 2017-01-29 NOTE — Telephone Encounter (Signed)
-----   Message from Malva Limesonald E Fisher, MD sent at 01/29/2017  7:56 AM EST ----- Lungs on xr have mostly cleared. Xray just shows emphysema. Need to follow up with pulmonology.

## 2017-01-30 DIAGNOSIS — J181 Lobar pneumonia, unspecified organism: Secondary | ICD-10-CM | POA: Diagnosis not present

## 2017-01-30 DIAGNOSIS — J440 Chronic obstructive pulmonary disease with acute lower respiratory infection: Secondary | ICD-10-CM | POA: Diagnosis not present

## 2017-01-30 DIAGNOSIS — I10 Essential (primary) hypertension: Secondary | ICD-10-CM | POA: Diagnosis not present

## 2017-01-30 DIAGNOSIS — I739 Peripheral vascular disease, unspecified: Secondary | ICD-10-CM | POA: Diagnosis not present

## 2017-01-31 ENCOUNTER — Other Ambulatory Visit: Payer: Self-pay | Admitting: *Deleted

## 2017-01-31 DIAGNOSIS — I110 Hypertensive heart disease with heart failure: Secondary | ICD-10-CM | POA: Diagnosis not present

## 2017-01-31 DIAGNOSIS — J181 Lobar pneumonia, unspecified organism: Secondary | ICD-10-CM | POA: Diagnosis not present

## 2017-01-31 DIAGNOSIS — G629 Polyneuropathy, unspecified: Secondary | ICD-10-CM | POA: Diagnosis not present

## 2017-01-31 DIAGNOSIS — J440 Chronic obstructive pulmonary disease with acute lower respiratory infection: Secondary | ICD-10-CM | POA: Diagnosis not present

## 2017-01-31 DIAGNOSIS — I509 Heart failure, unspecified: Secondary | ICD-10-CM | POA: Diagnosis not present

## 2017-01-31 DIAGNOSIS — E43 Unspecified severe protein-calorie malnutrition: Secondary | ICD-10-CM | POA: Diagnosis not present

## 2017-01-31 DIAGNOSIS — M810 Age-related osteoporosis without current pathological fracture: Secondary | ICD-10-CM | POA: Diagnosis not present

## 2017-01-31 DIAGNOSIS — I739 Peripheral vascular disease, unspecified: Secondary | ICD-10-CM | POA: Diagnosis not present

## 2017-01-31 DIAGNOSIS — M47816 Spondylosis without myelopathy or radiculopathy, lumbar region: Secondary | ICD-10-CM | POA: Diagnosis not present

## 2017-01-31 NOTE — Patient Outreach (Signed)
[facility]) Care Management   01/31/2017  Cheryl [name] 07/06/1951 [id number]  [name] is an 66 y.o. female  Subjective:  I am doing better this morning ,  States her breathing is better in the morning than in the  afternoon. Patient discussed upcoming visit with pulmonary doctor.   Patient discussed she had recent fall in home on carpet area, no injury.  Patient discussed her recent injection at pain center for back pain.  Patient discussed she is still getting adjusted to new full set of dentures, discussed appetite is a little better tolerating eating a little better. Patient discussed she can't eat mashed up food , eating regular food as tolerated     Objective:  BP 128/80 (BP Location: Left Arm, Patient Position: Sitting, Cuff Size: Normal)   Pulse 81   Resp 18   SpO2 98%   On arrival patient sitting on her deck smoking , and returned there at end of visit to smoke  Review of Systems  Constitutional: Negative.   HENT: Negative.   Eyes: Negative.   Respiratory: Positive for shortness of breath.        Later in the afternoon gets more short of breath in afternoon  Cardiovascular: Negative.   Gastrointestinal: Negative.   Genitourinary: Negative.   Musculoskeletal: Positive for falls.  Skin: Negative.   Neurological: Negative.   Endo/Heme/Allergies: Negative.   Psychiatric/Behavioral: Negative.     Physical Exam  Constitutional: She is oriented to person, place, and time. She appears well-developed and well-nourished.  Cardiovascular: Normal rate, normal heart sounds and intact distal pulses.   Respiratory: Effort normal and breath sounds normal. No respiratory distress.  GI: Soft.  Neurological: She is alert and oriented to person, place, and time.  Skin: Skin is warm and dry.  Psychiatric: She has a normal mood and affect. Her behavior is normal. Judgment and thought content normal.    Encounter Medications:   Outpatient Encounter  Prescriptions as of 01/31/2017  Medication Sig Note  . acetaminophen (TYLENOL) 325 MG tablet Take 2 tablets (650 mg total) by mouth every 6 (six) hours as needed for mild pain (or Fever >/= 101).   . ADVAIR DISKUS 250-50 MCG/DOSE AEPB inhale 1 dose by mouth twice a day   . albuterol (PROVENTIL HFA;VENTOLIN HFA) 108 (90 BASE) MCG/ACT inhaler Inhale 2 puffs into the lungs every 4 (four) hours as needed for wheezing or shortness of breath.   Marland Kitchen albuterol (PROVENTIL) (2.5 MG/3ML) 0.083% nebulizer solution Take 3 mLs (2.5 mg total) by nebulization every 4 (four) hours as needed for wheezing.   Marland Kitchen ALPRAZolam (XANAX) 0.5 MG tablet    . benzonatate (TESSALON PERLES) 100 MG capsule Take 1 capsule (100 mg total) by mouth 3 (three) times daily as needed for cough.   . celecoxib (CELEBREX) 100 MG capsule Take 1 capsule (100 mg total) by mouth 2 (two) times daily as needed. 12/04/2016: Taking only as needed  . clopidogrel (PLAVIX) 75 MG tablet Take 75 mg by mouth daily.   . diphenoxylate-atropine (LOMOTIL) 2.5-0.025 MG per tablet Take 2 tablets by mouth 4 (four) times daily as needed for diarrhea or loose stools. Reported on 03/11/2016   . donepezil (ARICEPT) 10 MG tablet Take 10 mg by mouth at bedtime.   Marland Kitchen estropipate (OGEN) 0.75 MG tablet Take 1 tablet (0.75 mg total) by mouth every other day.   . fentaNYL (DURAGESIC - DOSED MCG/HR) 50 MCG/HR    . fluticasone (FLONASE) 50 MCG/ACT  nasal spray Place 2 sprays into both nostrils daily.   . furosemide (LASIX) 40 MG tablet    . LYRICA 150 MG capsule    . mupirocin ointment (BACTROBAN) 2 % Place 1 application into the nose 2 (two) times daily. (Patient taking differently: Place 1 application into the nose 2 (two) times daily. Right lower leg twice daily)   . pantoprazole (PROTONIX) 40 MG tablet take 1 tablet by mouth twice a day   . pregabalin (LYRICA) 75 MG capsule Take 1 capsule (75 mg total) by mouth 3 (three) times daily.   . simvastatin (ZOCOR) 10 MG tablet Take 10  mg by mouth at bedtime. Reported on 12/04/2015   . SPIRIVA HANDIHALER 18 MCG inhalation capsule inhale the contents of one capsule in the handihaler once daily   . SUMAtriptan (IMITREX) 25 MG tablet Take 25 mg by mouth as needed for migraine. May repeat in 2 hours if headache persists or recurs.   . traMADol (ULTRAM) 50 MG tablet take 2 tablets by mouth every 6 hours if needed for MODERATE OR SEVERE pain (MAY FILL 1/25)   . triamcinolone ointment (KENALOG) 0.5 % Apply to lesions on hands twice a day as needed    No facility-administered encounter medications on file as of 01/31/2017.     Functional Status:   In your present state of health, do you have any difficulty performing the following activities: 01/21/2017 12/24/2016  Hearing? N N  Vision? N N  Difficulty concentrating or making decisions? N Y  Walking or climbing stairs? Y Y  Dressing or bathing? Y Y  Doing errands, shopping? [name]  Preparing Food and eating ? Y -  Using the Toilet? Y -  In the past six months, have you accidently leaked urine? Y -  Do you have problems with loss of bowel control? N -  Managing your Medications? Y -  Managing your Finances? Y -  Housekeeping or managing your Housekeeping? N -  Some recent data might be hidden    Fall/Depression Screening:    PHQ 2/9 Scores 01/10/2017 12/11/2016 12/05/2016 11/12/2016 08/20/2016 07/08/2016 07/03/2016  PHQ - 2 Score 0 0 0 0 0 0 0    Assessment:   Patient's sister's Cindy  present at visit today and has been staying with her during the day since discharged from rehab. Patient is still followed by [facility] RN and physical therapy.   COPD/Recent Pneumonia- Reports being in green zone,usual state of breathing, with light yellow sputum, continues to smoke, declines being ready to quit.  Using oxygen at night only and takes portable oxygen when out of home .   Recent New Dentures  - improvement in tolerating diet, adhering aspiration precautions of not using straws, , sitting up  after eating,no increase coughing after swallowing . Patient still eats mostly one meal a day and snacks throughout day.   Chronic back pain= continued follow up at pain center, pain at her goal of 3 today.   High Fall Risk -  Walks at fast pace with walker, reinforced slower gait. Patient stays on telephone line with her sister from [place]  all day, in case there is a need, she declines wearing her alert button.     Plan:  Will continue weekly transition of care outreach by telephone next call in a week Collaboration for patient centered care goals addressed at visit .    [facility] CM Care Plan Problem One   Flowsheet Row Most Recent Value  Care  Plan Problem One  Patient with recent hospitalization and rehab stay related to pneumonia   Role Documenting the Problem One  Care Management [place] for Problem One  Active  [facility] Long Term Goal (31-90 days)  Patient will not experience a hospital readmission in the next 60 days   [facility] Long Term Goal Start Date  01/17/17  Interventions for Problem One Long Term Goal  Reinforced importance of taking medications , following diet restrictions and following up with MD as recommended   [facility] CM Short Term Goal #1 (0-30 days)  Patient will schedule and attend PCP post hospital visit in the next 14 days   [facility] CM Short Term Goal #1 Start Date  01/17/17  [facility] CM Short Term Goal #1 Met Date  01/31/17  [facility] CM Short Term Goal #2 (0-30 days)  Patient will be able to state sign/symptoms of worsening of recurrent pneumonia in the next 30 days   [facility] CM Short Term Goal #2 Start Date  01/17/17  Interventions for Short Term Goal #2  Reviewed EMMI handout on When to call for COPD  [facility] CM Short Term Goal #3 (0-30 days)  Patient will be able to identify measures to decrease falls in the next 30 days   [facility] CM Short Term Goal #3 Start Date  01/21/17  Interventions for Short Tern Goal #3  Discussed patient to slow down during ambulating   [facility] CM Short Term Goal  #4 (0-30 days)  Patient will report using her incentive spirometry at least twice daily in the next 30 days   [facility] CM Short Term Goal #4 Start Date  01/31/17  Interventions for Short Term Goal #4  Discussed benefits of using incentive for exercise for lungs,       Joylene Draft, RN, [facility] [phone]- Mobile [phone]- [place]

## 2017-02-03 DIAGNOSIS — G629 Polyneuropathy, unspecified: Secondary | ICD-10-CM | POA: Diagnosis not present

## 2017-02-03 DIAGNOSIS — J181 Lobar pneumonia, unspecified organism: Secondary | ICD-10-CM | POA: Diagnosis not present

## 2017-02-03 DIAGNOSIS — I110 Hypertensive heart disease with heart failure: Secondary | ICD-10-CM | POA: Diagnosis not present

## 2017-02-03 DIAGNOSIS — I509 Heart failure, unspecified: Secondary | ICD-10-CM | POA: Diagnosis not present

## 2017-02-03 DIAGNOSIS — M47816 Spondylosis without myelopathy or radiculopathy, lumbar region: Secondary | ICD-10-CM | POA: Diagnosis not present

## 2017-02-03 DIAGNOSIS — J440 Chronic obstructive pulmonary disease with acute lower respiratory infection: Secondary | ICD-10-CM | POA: Diagnosis not present

## 2017-02-03 DIAGNOSIS — I739 Peripheral vascular disease, unspecified: Secondary | ICD-10-CM | POA: Diagnosis not present

## 2017-02-03 DIAGNOSIS — M810 Age-related osteoporosis without current pathological fracture: Secondary | ICD-10-CM | POA: Diagnosis not present

## 2017-02-03 DIAGNOSIS — E43 Unspecified severe protein-calorie malnutrition: Secondary | ICD-10-CM | POA: Diagnosis not present

## 2017-02-04 DIAGNOSIS — I509 Heart failure, unspecified: Secondary | ICD-10-CM | POA: Diagnosis not present

## 2017-02-04 DIAGNOSIS — I739 Peripheral vascular disease, unspecified: Secondary | ICD-10-CM | POA: Diagnosis not present

## 2017-02-04 DIAGNOSIS — G629 Polyneuropathy, unspecified: Secondary | ICD-10-CM | POA: Diagnosis not present

## 2017-02-04 DIAGNOSIS — E43 Unspecified severe protein-calorie malnutrition: Secondary | ICD-10-CM | POA: Diagnosis not present

## 2017-02-04 DIAGNOSIS — J440 Chronic obstructive pulmonary disease with acute lower respiratory infection: Secondary | ICD-10-CM | POA: Diagnosis not present

## 2017-02-04 DIAGNOSIS — M47816 Spondylosis without myelopathy or radiculopathy, lumbar region: Secondary | ICD-10-CM | POA: Diagnosis not present

## 2017-02-04 DIAGNOSIS — J181 Lobar pneumonia, unspecified organism: Secondary | ICD-10-CM | POA: Diagnosis not present

## 2017-02-04 DIAGNOSIS — M810 Age-related osteoporosis without current pathological fracture: Secondary | ICD-10-CM | POA: Diagnosis not present

## 2017-02-04 DIAGNOSIS — I110 Hypertensive heart disease with heart failure: Secondary | ICD-10-CM | POA: Diagnosis not present

## 2017-02-05 ENCOUNTER — Other Ambulatory Visit: Payer: Self-pay | Admitting: *Deleted

## 2017-02-05 ENCOUNTER — Ambulatory Visit: Payer: Medicare HMO | Attending: Pain Medicine | Admitting: Pain Medicine

## 2017-02-05 ENCOUNTER — Encounter: Payer: Self-pay | Admitting: Pain Medicine

## 2017-02-05 VITALS — BP 141/62 | HR 71 | Temp 98.0°F | Resp 18 | Ht <= 58 in | Wt 96.0 lb

## 2017-02-05 DIAGNOSIS — M1990 Unspecified osteoarthritis, unspecified site: Secondary | ICD-10-CM | POA: Diagnosis not present

## 2017-02-05 DIAGNOSIS — M47816 Spondylosis without myelopathy or radiculopathy, lumbar region: Secondary | ICD-10-CM | POA: Diagnosis not present

## 2017-02-05 DIAGNOSIS — M4726 Other spondylosis with radiculopathy, lumbar region: Secondary | ICD-10-CM | POA: Insufficient documentation

## 2017-02-05 DIAGNOSIS — E785 Hyperlipidemia, unspecified: Secondary | ICD-10-CM | POA: Insufficient documentation

## 2017-02-05 DIAGNOSIS — M810 Age-related osteoporosis without current pathological fracture: Secondary | ICD-10-CM | POA: Diagnosis not present

## 2017-02-05 DIAGNOSIS — Z7951 Long term (current) use of inhaled steroids: Secondary | ICD-10-CM | POA: Diagnosis not present

## 2017-02-05 DIAGNOSIS — F419 Anxiety disorder, unspecified: Secondary | ICD-10-CM | POA: Diagnosis not present

## 2017-02-05 DIAGNOSIS — M1288 Other specific arthropathies, not elsewhere classified, other specified site: Secondary | ICD-10-CM | POA: Diagnosis not present

## 2017-02-05 DIAGNOSIS — M5442 Lumbago with sciatica, left side: Secondary | ICD-10-CM | POA: Diagnosis not present

## 2017-02-05 DIAGNOSIS — I739 Peripheral vascular disease, unspecified: Secondary | ICD-10-CM | POA: Insufficient documentation

## 2017-02-05 DIAGNOSIS — G8929 Other chronic pain: Secondary | ICD-10-CM | POA: Diagnosis not present

## 2017-02-05 DIAGNOSIS — I2510 Atherosclerotic heart disease of native coronary artery without angina pectoris: Secondary | ICD-10-CM | POA: Diagnosis not present

## 2017-02-05 DIAGNOSIS — M488X6 Other specified spondylopathies, lumbar region: Secondary | ICD-10-CM | POA: Diagnosis not present

## 2017-02-05 DIAGNOSIS — F17210 Nicotine dependence, cigarettes, uncomplicated: Secondary | ICD-10-CM | POA: Insufficient documentation

## 2017-02-05 DIAGNOSIS — G629 Polyneuropathy, unspecified: Secondary | ICD-10-CM | POA: Insufficient documentation

## 2017-02-05 DIAGNOSIS — G894 Chronic pain syndrome: Secondary | ICD-10-CM | POA: Diagnosis not present

## 2017-02-05 DIAGNOSIS — J449 Chronic obstructive pulmonary disease, unspecified: Secondary | ICD-10-CM | POA: Diagnosis not present

## 2017-02-05 DIAGNOSIS — I10 Essential (primary) hypertension: Secondary | ICD-10-CM | POA: Diagnosis not present

## 2017-02-05 DIAGNOSIS — K219 Gastro-esophageal reflux disease without esophagitis: Secondary | ICD-10-CM | POA: Diagnosis not present

## 2017-02-05 DIAGNOSIS — Z882 Allergy status to sulfonamides status: Secondary | ICD-10-CM | POA: Insufficient documentation

## 2017-02-05 DIAGNOSIS — M797 Fibromyalgia: Secondary | ICD-10-CM | POA: Insufficient documentation

## 2017-02-05 DIAGNOSIS — G4700 Insomnia, unspecified: Secondary | ICD-10-CM | POA: Insufficient documentation

## 2017-02-05 DIAGNOSIS — M5441 Lumbago with sciatica, right side: Secondary | ICD-10-CM | POA: Diagnosis not present

## 2017-02-05 DIAGNOSIS — E559 Vitamin D deficiency, unspecified: Secondary | ICD-10-CM | POA: Insufficient documentation

## 2017-02-05 DIAGNOSIS — Z86718 Personal history of other venous thrombosis and embolism: Secondary | ICD-10-CM | POA: Insufficient documentation

## 2017-02-05 DIAGNOSIS — Z7902 Long term (current) use of antithrombotics/antiplatelets: Secondary | ICD-10-CM | POA: Insufficient documentation

## 2017-02-05 DIAGNOSIS — E7800 Pure hypercholesterolemia, unspecified: Secondary | ICD-10-CM | POA: Diagnosis not present

## 2017-02-05 DIAGNOSIS — Z8249 Family history of ischemic heart disease and other diseases of the circulatory system: Secondary | ICD-10-CM | POA: Insufficient documentation

## 2017-02-05 DIAGNOSIS — Z833 Family history of diabetes mellitus: Secondary | ICD-10-CM | POA: Insufficient documentation

## 2017-02-05 DIAGNOSIS — Z885 Allergy status to narcotic agent status: Secondary | ICD-10-CM | POA: Diagnosis not present

## 2017-02-05 DIAGNOSIS — Z79899 Other long term (current) drug therapy: Secondary | ICD-10-CM | POA: Insufficient documentation

## 2017-02-05 DIAGNOSIS — M545 Low back pain: Secondary | ICD-10-CM | POA: Diagnosis present

## 2017-02-05 DIAGNOSIS — D649 Anemia, unspecified: Secondary | ICD-10-CM | POA: Insufficient documentation

## 2017-02-05 DIAGNOSIS — Z79891 Long term (current) use of opiate analgesic: Secondary | ICD-10-CM | POA: Insufficient documentation

## 2017-02-05 NOTE — Patient Outreach (Signed)
[facility] Care Management  02/05/2017  Cheryl MargaritaMary George [name] [birth year] [id number]   Transition of care call  Spoke with patient discussed she is doing better now, reports she got short of breath this morning rushing around getting ready for MD appointment at pain clinic. Patient reports  she was better after resting. Patient states her sputum is still light to yellow colored but no increase, denies fever,patient states she has not needed to use her rescue inhaler.   Patient has her oxygen with her when traveling to appointments.  Patient denies any new concerns at this time.    Plan Will follow up by telephone in the next week for transition of care Patient will notify MD of worsening of symptoms of shortness of breath, cough and change in sputum .  Egbert GaribaldiKimberly Glover, RN, [facility] Care [facility] [phone] Mobile [phone]- Toll Free Main Office

## 2017-02-05 NOTE — Progress Notes (Signed)
Safety precautions to be maintained throughout the outpatient stay will include: orient to surroundings, keep bed in low position, maintain call bell within reach at all times, provide assistance with transfer out of bed and ambulation.  

## 2017-02-05 NOTE — Patient Instructions (Signed)
Stop Plavix 7 days before your appointment. Radiofrequency Lesioning Radiofrequency lesioning is a procedure that is performed to relieve pain. The procedure is often used for back, neck, or arm pain. Radiofrequency lesioning involves the use of a machine that creates radio waves to make heat. During the procedure, the heat is applied to the nerve that carries the pain signal. The heat damages the nerve and interferes with the pain signal. Pain relief usually starts about 2 weeks after the procedure and lasts for 6 months to 1 year. Tell a health care provider about:  Any allergies you have.  All medicines you are taking, including vitamins, herbs, eye drops, creams, and over-the-counter medicines.  Any problems you or family members have had with anesthetic medicines.  Any blood disorders you have.  Any surgeries you have had.  Any medical conditions you have.  Whether you are pregnant or may be pregnant. What are the risks? Generally, this is a safe procedure. However, problems may occur, including:  Pain or soreness at the injection site.  Infection at the injection site.  Damage to nerves or blood vessels. What happens before the procedure?  Ask your health care provider about:  Changing or stopping your regular medicines. This is especially important if you are taking diabetes medicines or blood thinners.  Taking medicines such as aspirin and ibuprofen. These medicines can thin your blood. Do not take these medicines before your procedure if your health care provider instructs you not to.  Follow instructions from your health care provider about eating or drinking restrictions.  Plan to have someone take you home after the procedure.  If you go home right after the procedure, plan to have someone with you for 24 hours. What happens during the procedure?  You will be given one or more of the following:  A medicine to help you relax (sedative).  A medicine to numb the  area (local anesthetic).  You will be awake during the procedure. You will need to be able to talk with the health care provider during the procedure.  With the help of a type of X-ray (fluoroscopy), the health care provider will insert a radiofrequency needle into the area to be treated.  Next, a wire that carries the radio waves (electrode) will be put through the radiofrequency needle. An electrical pulse will be sent through the electrode to verify the correct nerve. You will feel a tingling sensation, and you may have muscle twitching.  Then, the tissue that is around the needle tip will be heated by an electric current that is passed using the radiofrequency machine. This will numb the nerves.  A bandage (dressing) will be put on the insertion area after the procedure is done. The procedure may vary among health care providers and hospitals. What happens after the procedure?  Your blood pressure, heart rate, breathing rate, and blood oxygen level will be monitored often until the medicines you were given have worn off.  Return to your normal activities as directed by your health care provider. This information is not intended to replace advice given to you by your health care provider. Make sure you discuss any questions you have with your health care provider. Document Released: 07/10/2011 Document Revised: 04/18/2016 Document Reviewed: 12/19/2014 [employer] Interactive Patient Education  2017 [employer]

## 2017-02-05 NOTE — Progress Notes (Signed)
Patient's Name: Cheryl [name]  MRN: [id number]  Referring Provider: Birdie Sons, MD  DOB: May 04, 1951  PCP: Birdie Sons, MD  DOS: 02/05/2017  Note by: Kathlen Brunswick. Dossie Arbour, MD  Service setting: Ambulatory outpatient  Specialty: Interventional Pain Management  Location: [facility] (AMB) Pain Management Facility    Patient type: Established   Primary Reason(s) for Visit: Encounter for post-procedure evaluation of chronic illness with mild to moderate exacerbation CC: Back Pain (lower)  HPI  Cheryl [name] is a 66 y.o. year old, female patient, who comes today for a post-procedure evaluation. She has Essential hypertension; GERD (gastroesophageal reflux disease); Hyperlipemia; Anxiety; Airway hyperreactivity; Back pain, thoracic; Coronary artery disease; Excessive falling; Alteration in bowel elimination: incontinence; Insomnia; Decreased testosterone level; Leg weakness; Menopausal symptom; Migraine; Neuropathy ([place]); Fecal occult blood test positive; OP (osteoporosis); Panic disorder; Compulsive tobacco user syndrome; Urinary incontinence; Vitamin D deficiency; Weight loss; COPD (chronic obstructive pulmonary disease) ([place]); Dementia; Lumbar radicular pain (Bilateral) (L>R) (L4); Chronic low back pain (Location of Primary Source of Pain) (Bilateral) (L>R); Encounter for therapeutic drug level monitoring; Uncomplicated opioid dependence ([place]); Chronic pain syndrome; Hypercholesteremia; Peripheral nerve disease ([place]); Peripheral vascular disease ([place]); Platelet inhibition due to Plavix; Abnormal mammogram of right breast; Dilated intrahepatic bile duct; Sepsis ([place]); Dysphagia; Acute diastolic CHF (congestive heart failure) ([place]); Leukocytosis; Long term current use of opiate analgesic; Fibromyalgia; Osteoarthrosis; Long term current use of anticoagulant therapy (Plavix); Opiate use (160 MME/Day); Anemia; Lumbar facet syndrome (Location of Primary Source of Pain) (Bilateral) (L>R); Lumbar spondylosis; Nausea  with vomiting; Protein-calorie malnutrition, severe; Metabolic encephalopathy; and Hypokalemia on her problem list. Her primarily concern today is the Back Pain (lower)  Pain Assessment: Self-Reported Pain Score: 3 /10             Reported level is compatible with observation.       Pain Type: Chronic pain Pain Location: Back Pain Orientation: Lower Pain Descriptors / Indicators: Aching Pain Frequency: Constant  Cheryl [name] comes in today for post-procedure evaluation after the treatment done on 01/22/2017.  Further details on both, my assessment(s), as well as the proposed treatment plan, please see below.  Post-Procedure Assessment  01/22/2017 Procedure: Palliative bilateral lumbar facet block under fluoroscopic guidance and IV sedation Post-procedure pain score: 0/10         Influential Factors: BMI: 20.06 kg/m Intra-procedural challenges: None observed Assessment challenges: None detected         Post-procedural side-effects, adverse reactions, or complications: None reported Reported issues: None  Sedation: Sedation provided. When no sedatives are used, the analgesic levels obtained are directly associated to the effectiveness of the local anesthetics. However, when sedation is provided, the level of analgesia obtained during the initial 1 hour following the intervention, is believed to be the result of a combination of factors. These factors may include, but are not limited to: 1. The effectiveness of the local anesthetics used. 2. The effects of the analgesic(s) and/or anxiolytic(s) used. 3. The degree of discomfort experienced by the patient at the time of the procedure. 4. The patients ability and reliability in recalling and recording the events. 5. The presence and influence of possible secondary gains and/or psychosocial factors. Reported result: Relief experienced during the 1st hour after the procedure: 100 % (Ultra-Short Term Relief) Interpretative annotation: Analgesia  during this period is likely to be Local Anesthetic and/or IV Sedative (Analgesic/Anxiolitic) related.          Effects of local anesthetic: The analgesic effects attained during this period  are directly associated to the localized infiltration of local anesthetics and therefore cary significant diagnostic value as to the etiological location, or anatomical origin, of the pain. Expected duration of relief is directly dependent on the pharmacodynamics of the local anesthetic used. Long-acting (4-6 hours) anesthetics used.  Reported result: Relief during the next 4 to 6 hour after the procedure: 100 % (Short-Term Relief) Interpretative annotation: Complete relief would suggest area to be the source of the pain.          Long-term benefit: Defined as the period of time past the expected duration of local anesthetics. With the possible exception of prolonged sympathetic blockade from the local anesthetics, benefits during this period are typically attributed to, or associated with, other factors such as analgesic sensory neuropraxia, antiinflammatory effects, or beneficial biochemical changes provided by agents other than the local anesthetics Reported result: Extended relief following procedure: 0 % (Long-Term Relief) Interpretative annotation: No long-term benefit. This could suggest limited inflammatory component to the pain with possible mechanical aggravating factors.          Current benefits: Defined as persistent relief that continues at this point in time.   Reported results: Treated area: 0 %       Interpretative annotation: Recurrance of symptoms. This would suggest persistent aggravating factors  Interpretation: Results would suggest a successful diagnostic intervention. The patient has failed to respond to conservative therapies including over-the-counter medications, anti-inflammatories, muscle relaxants, membrane stabilizers, opioids, physical therapy, modalities such as heat and ice, as  well as more invasive techniques such as nerve blocks. Because Cheryl [name] did attain more than 50% relief of the pain during a series of diagnostic blocks conducted in separate occasions, I believe it is medically necessary to proceed with Radiofrequency Ablation, in order to attempt gaining longer relief.  Laboratory Chemistry  Inflammation Markers Lab Results  Component Value Date   CRP 0.6 03/01/2016   ESRSEDRATE 52 (H) 03/01/2016   (CRP: Acute Phase) (ESR: Chronic Phase) Renal Function Markers Lab Results  Component Value Date   BUN 13 01/05/2017   CREATININE 0.94 01/05/2017   GFRAA >60 01/05/2017   GFRNONAA >60 01/05/2017   Hepatic Function Markers Lab Results  Component Value Date   AST 30 01/05/2017   ALT 12 (L) 01/05/2017   ALBUMIN 3.3 (L) 01/05/2017   ALKPHOS 117 01/05/2017   Electrolytes Lab Results  Component Value Date   NA 135 01/05/2017   K 4.4 01/05/2017   CL 99 (L) 01/05/2017   CALCIUM 8.6 (L) 01/05/2017   MG 1.9 12/23/2016   Neuropathy Markers Lab Results  Component Value Date   VITAMINB12 242 03/01/2016   Bone Pathology Markers Lab Results  Component Value Date   ALKPHOS 117 01/05/2017   VD25OH 25.6 (L) 01/17/2016   CALCIUM 8.6 (L) 01/05/2017   Coagulation Parameters Lab Results  Component Value Date   INR 0.97 11/05/2016   LABPROT 12.9 11/05/2016   APTT 38 (H) 10/17/2015   PLT 375 01/05/2017   Cardiovascular Markers Lab Results  Component Value Date   BNP 35.0 01/05/2017   HGB 9.2 (L) 01/05/2017   HCT 27.8 (L) 01/05/2017   Note: Lab results reviewed.  Recent Diagnostic Imaging Review  Dg Chest 2 View  Result Date: 01/29/2017 CLINICAL DATA:  Follow-up of pneumonia from 2 weeks ago. Persistent cough. History of asthma -COPD. Current smoker. EXAM: CHEST  2 VIEW COMPARISON:  Chest x-ray of January 05, 2017 FINDINGS: The lungs remain mildly hyper inflated. Right lower lobe  infiltrate has cleared. On the left there is persistent increased  density in the lingula which is less conspicuous than on the previous study. There is no new infiltrate. There is no pleural effusion. The heart is top-normal in size. The pulmonary vascularity is normal. There is calcification in the wall of the aortic arch. There is prominent thoracic kyphosis due to stable wedge compression of T11. IMPRESSION: Persistent increased density in the lingula. Interval clearing of right lower lobe infiltrate. An additional follow-up chest x-ray in 2-3 weeks is recommended to assure complete clearing. Thoracic aortic atherosclerosis. Electronically Signed   By: David  Martinique M.D.   On: 01/29/2017 07:19   Note: Imaging results reviewed.          Meds  The patient has a current medication list which includes the following prescription(s): acetaminophen, advair diskus, albuterol, albuterol, alprazolam, benzonatate, celecoxib, clopidogrel, diphenoxylate-atropine, donepezil, estropipate, fentanyl, fluticasone, furosemide, lyrica, mupirocin ointment, pantoprazole, simvastatin, spiriva handihaler, sumatriptan, tramadol, triamcinolone ointment, and pregabalin.  Current Outpatient Prescriptions on File Prior to Visit  Medication Sig  . acetaminophen (TYLENOL) 325 MG tablet Take 2 tablets (650 mg total) by mouth every 6 (six) hours as needed for mild pain (or Fever >/= 101).  . ADVAIR DISKUS 250-50 MCG/DOSE AEPB inhale 1 dose by mouth twice a day  . albuterol (PROVENTIL HFA;VENTOLIN HFA) 108 (90 BASE) MCG/ACT inhaler Inhale 2 puffs into the lungs every 4 (four) hours as needed for wheezing or shortness of breath.  Marland Kitchen albuterol (PROVENTIL) (2.5 MG/3ML) 0.083% nebulizer solution Take 3 mLs (2.5 mg total) by nebulization every 4 (four) hours as needed for wheezing.  Marland Kitchen ALPRAZolam (XANAX) 0.5 MG tablet   . benzonatate (TESSALON PERLES) 100 MG capsule Take 1 capsule (100 mg total) by mouth 3 (three) times daily as needed for cough.  . celecoxib (CELEBREX) 100 MG capsule Take 1 capsule  (100 mg total) by mouth 2 (two) times daily as needed.  . clopidogrel (PLAVIX) 75 MG tablet Take 75 mg by mouth daily.  . diphenoxylate-atropine (LOMOTIL) 2.5-0.025 MG per tablet Take 2 tablets by mouth 4 (four) times daily as needed for diarrhea or loose stools. Reported on 03/11/2016  . donepezil (ARICEPT) 10 MG tablet Take 10 mg by mouth at bedtime.  Marland Kitchen estropipate (OGEN) 0.75 MG tablet Take 1 tablet (0.75 mg total) by mouth every other day.  . fentaNYL (DURAGESIC - DOSED MCG/HR) 50 MCG/HR   . fluticasone (FLONASE) 50 MCG/ACT nasal spray Place 2 sprays into both nostrils daily.  . furosemide (LASIX) 40 MG tablet   . LYRICA 150 MG capsule   . mupirocin ointment (BACTROBAN) 2 % Place 1 application into the nose 2 (two) times daily. (Patient taking differently: Place 1 application into the nose 2 (two) times daily. Right lower leg twice daily)  . pantoprazole (PROTONIX) 40 MG tablet take 1 tablet by mouth twice a day  . simvastatin (ZOCOR) 10 MG tablet Take 10 mg by mouth at bedtime. Reported on 12/04/2015  . SPIRIVA HANDIHALER 18 MCG inhalation capsule inhale the contents of one capsule in the handihaler once daily  . SUMAtriptan (IMITREX) 25 MG tablet Take 25 mg by mouth as needed for migraine. May repeat in 2 hours if headache persists or recurs.  . traMADol (ULTRAM) 50 MG tablet take 2 tablets by mouth every 6 hours if needed for MODERATE OR SEVERE pain (MAY FILL 1/25)  . triamcinolone ointment (KENALOG) 0.5 % Apply to lesions on hands twice a day as needed  .  pregabalin (LYRICA) 75 MG capsule Take 1 capsule (75 mg total) by mouth 3 (three) times daily.   No current facility-administered medications on file prior to visit.    ROS  Constitutional: Denies any fever or chills Gastrointestinal: No reported hemesis, hematochezia, vomiting, or acute GI distress Musculoskeletal: Denies any acute onset joint swelling, redness, loss of ROM, or weakness Neurological: No reported episodes of acute onset  apraxia, aphasia, dysarthria, agnosia, amnesia, paralysis, loss of coordination, or loss of consciousness  Allergies  Ms. Petraitis is allergic to percocet [oxycodone-acetaminophen]; aspirin; codeine; propoxyphene; and sulfa antibiotics.  [place]  Drug: Ms. Phenix  reports that she does not use drugs. Alcohol:  reports that she does not drink alcohol. Tobacco:  reports that she has been smoking Cigarettes.  She has a 50.00 pack-year smoking history. She has never used smokeless tobacco. Medical:  has a past medical history of Allergy; Anxiety; Arthritis; Aspiration pneumonitis ([place]) (11/24/2015); Asthma; Chronic pain; COPD (chronic obstructive pulmonary disease) ([place]); Coronary artery disease; DVT (deep venous thrombosis) ([place]); GERD (gastroesophageal reflux disease); Headache; Hyperlipidemia; Hypertension; Migraines; Neuropathy ([place]) (2010); Osteoporosis; Oxygen deficiency; Peripheral vascular disease ([place]); Pneumonia; Pneumonia (11/19/2015); Pneumonia (10/2016); and Vitamin D deficiency. Family: family history includes Arthritis in her mother; Cancer in her brother, brother, and mother; Diabetes in her brother, mother, and sister; Heart disease in her father and mother.  Past Surgical History:  Procedure Laterality Date  . ABDOMINAL HYSTERECTOMY  1975   Bilaterl Oophorectomy; [place] to IUD infection  . abdomnal aortic stent  05/30/2008   Dr. Quay Burow  . APPENDECTOMY    . APPENDECTOMY    . cardiac catherization  10/31/2009  . CERVICAL FUSION  C5 - 6/C6-7  . CHOLECYSTECTOMY  1972  . COLONOSCOPY WITH PROPOFOL N/A 07/27/2015   Procedure: COLONOSCOPY WITH PROPOFOL;  Surgeon: Hulen Luster, MD;  Location: [facility] ENDOSCOPY;  Service: Gastroenterology;  Laterality: N/A;  . ESOPHAGOGASTRODUODENOSCOPY (EGD) WITH PROPOFOL N/A 07/27/2015   Procedure: ESOPHAGOGASTRODUODENOSCOPY (EGD) WITH PROPOFOL;  Surgeon: Hulen Luster, MD;  Location: [facility] ENDOSCOPY;  Service: Gastroenterology;  Laterality: N/A;  . FOOT SURGERY  Bilateral    5-6 years per patient  . SPINE SURGERY     Constitutional Exam  General appearance: Well nourished, well developed, and well hydrated. In no apparent acute distress Vitals:   02/05/17 1126  BP: (!) 141/62  Pulse: 71  Resp: 18  Temp: 98 F (36.7 C)  TempSrc: Oral  SpO2: 95%  Weight: 96 lb (43.5 kg)  Height: _0  (1.473 m)   BMI Assessment: Estimated body mass index is 20.06 kg/m as calculated from the following:   Height as of this encounter: _1  (1.473 m).   Weight as of this encounter: 96 lb (43.5 kg).  BMI interpretation table: BMI level Category Range association with higher incidence of chronic pain  <18 kg/m2 Underweight   18.5-24.9 kg/m2 Ideal body weight   25-29.9 kg/m2 Overweight Increased incidence by 20%  30-34.9 kg/m2 Obese (Class I) Increased incidence by 68%  35-39.9 kg/m2 Severe obesity (Class II) Increased incidence by 136%  >40 kg/m2 Extreme obesity (Class III) Increased incidence by 254%   BMI Readings from Last 4 Encounters:  02/05/17 20.06 kg/m  01/28/17 20.48 kg/m  01/22/17 20.06 kg/m  01/05/17 19.39 kg/m   Wt Readings from Last 4 Encounters:  02/05/17 96 lb (43.5 kg)  01/28/17 98 lb (44.5 kg)  01/22/17 96 lb (43.5 kg)  01/05/17 96 lb (43.5 kg)  Psych/Mental status: Alert, oriented x  3 (person, place, & time)       Eyes: PERLA Respiratory: No evidence of acute respiratory distress  Cervical Spine Exam  Inspection: No masses, redness, or swelling Alignment: Symmetrical Functional ROM: Unrestricted ROM Stability: No instability detected Muscle strength & Tone: Functionally intact Sensory: Unimpaired Palpation: Non-contributory  Upper Extremity (UE) Exam    Side: Right upper extremity  Side: Left upper extremity  Inspection: No masses, redness, swelling, or asymmetry. No contractures  Inspection: No masses, redness, swelling, or asymmetry. No contractures  Functional ROM: Unrestricted ROM          Functional ROM:  Unrestricted ROM          Muscle strength & Tone: Functionally intact  Muscle strength & Tone: Functionally intact  Sensory: Unimpaired  Sensory: Unimpaired  Palpation: Euthermic  Palpation: Euthermic  Specialized Test(s): Deferred         Specialized Test(s): Deferred          Thoracic Spine Exam  Inspection: increased thoracic Kyphosis Alignment: Symmetrical Functional ROM: Unrestricted ROM Stability: No instability detected Sensory: Unimpaired Muscle strength & Tone: Functionally intact Palpation: Non-contributory  Lumbar Spine Exam  Inspection: No masses, redness, or swelling Alignment: Symmetrical Functional ROM: Limited ROM Stability: No instability detected Muscle strength & Tone: Functionally intact Sensory: Movement-associated pain Palpation: Complains of area being tender to palpation Provocative Tests: Lumbar Hyperextension and rotation test: Positive bilaterally for facet joint pain. Patrick's Maneuver: evaluation deferred today              Gait & Posture Assessment  Ambulation: Patient ambulates using a walker Gait: Very limited, using assistive device to ambulate Posture: Antalgic   Lower Extremity Exam    Side: Right lower extremity  Side: Left lower extremity  Inspection: No masses, redness, swelling, or asymmetry. No contractures  Inspection: No masses, redness, swelling, or asymmetry. No contractures  Functional ROM: Unrestricted ROM          Functional ROM: Unrestricted ROM          Muscle strength & Tone: Functionally intact  Muscle strength & Tone: Functionally intact  Sensory: Unimpaired  Sensory: Unimpaired  Palpation: No palpable anomalies  Palpation: No palpable anomalies   Assessment  Primary Diagnosis & Pertinent Problem List: The primary encounter diagnosis was Lumbar facet syndrome (Location of Primary Source of Pain) (Bilateral) (L>R). Diagnoses of Chronic low back pain (Location of Primary Source of Pain) (Bilateral) (L>R) and Lumbar  spondylosis were also pertinent to this visit.  Status Diagnosis  Controlled Controlled Controlled 1. Lumbar facet syndrome (Location of Primary Source of Pain) (Bilateral) (L>R)   2. Chronic low back pain (Location of Primary Source of Pain) (Bilateral) (L>R)   3. Lumbar spondylosis      Plan of Care  Pharmacotherapy (Medications Ordered): No orders of the defined types were placed in this encounter.  New Prescriptions   No medications on file   Medications administered today: Ms. Pech had no medications administered during this visit. Lab-work, procedure(s), and/or referral(s): Orders Placed This Encounter  Procedures  . Radiofrequency,Lumbar   Imaging and/or referral(s): None  Interventional therapies: Planned, scheduled, and/or pending:   Bilateral lumbar facet radiofrequency ablation under fluoroscopic guidance and IV sedation starting with the left side.    Considering:   Bilateral lumbar facet radiofrequency ablation under fluoroscopic guidance and IV sedation starting with the left side.    Palliative PRN treatment(s):   Palliative bilateral lumbar facet block    Provider-requested follow-up: Return for Procedure: Bilateral lumbar  facet RFA.  Future Appointments Date Time Provider [place]  03/06/2017 1:30 PM Milinda Pointer, MD [facility] None  03/13/2017 10:30 AM Laverle Hobby, MD [facility] None   Primary Care Physician: Birdie Sons, MD Location: [facility] Note by: Kathlen Brunswick. Dossie Arbour, M.D, DABA, DABAPM, DABPM, DABIPP, FIPP Date: 02/05/2017; Time: 12:31 PM  Pain Score Disclaimer: We use the NRS-11 scale. This is a self-reported, subjective measurement of pain severity with only modest accuracy. It is used primarily to identify changes within a particular patient. It must be understood that outpatient pain scales are significantly less accurate that those used for research, where they can be applied under  ideal controlled circumstances with minimal exposure to variables. In reality, the score is likely to be a combination of pain intensity and pain affect, where pain affect describes the degree of emotional arousal or changes in action readiness caused by the sensory experience of pain. Factors such as social and work situation, setting, emotional state, anxiety levels, expectation, and prior pain experience may influence pain perception and show large inter-individual differences that may also be affected by time variables.  Patient instructions provided during this appointment: Patient Instructions  Stop Plavix 7 days before your appointment. Radiofrequency Lesioning Radiofrequency lesioning is a procedure that is performed to relieve pain. The procedure is often used for back, neck, or arm pain. Radiofrequency lesioning involves the use of a machine that creates radio waves to make heat. During the procedure, the heat is applied to the nerve that carries the pain signal. The heat damages the nerve and interferes with the pain signal. Pain relief usually starts about 2 weeks after the procedure and lasts for 6 months to 1 year. Tell a health care provider about:  Any allergies you have.  All medicines you are taking, including vitamins, herbs, eye drops, creams, and over-the-counter medicines.  Any problems you or family members have had with anesthetic medicines.  Any blood disorders you have.  Any surgeries you have had.  Any medical conditions you have.  Whether you are pregnant or may be pregnant. What are the risks? Generally, this is a safe procedure. However, problems may occur, including:  Pain or soreness at the injection site.  Infection at the injection site.  Damage to nerves or blood vessels. What happens before the procedure?  Ask your health care provider about:  Changing or stopping your regular medicines. This is especially important if you are taking diabetes  medicines or blood thinners.  Taking medicines such as aspirin and ibuprofen. These medicines can thin your blood. Do not take these medicines before your procedure if your health care provider instructs you not to.  Follow instructions from your health care provider about eating or drinking restrictions.  Plan to have someone take you home after the procedure.  If you go home right after the procedure, plan to have someone with you for 24 hours. What happens during the procedure?  You will be given one or more of the following:  A medicine to help you relax (sedative).  A medicine to numb the area (local anesthetic).  You will be awake during the procedure. You will need to be able to talk with the health care provider during the procedure.  With the help of a type of X-ray (fluoroscopy), the health care provider will insert a radiofrequency needle into the area to be treated.  Next, a wire that carries the radio waves (electrode) will be put through the radiofrequency  needle. An electrical pulse will be sent through the electrode to verify the correct nerve. You will feel a tingling sensation, and you may have muscle twitching.  Then, the tissue that is around the needle tip will be heated by an electric current that is passed using the radiofrequency machine. This will numb the nerves.  A bandage (dressing) will be put on the insertion area after the procedure is done. The procedure may vary among health care providers and hospitals. What happens after the procedure?  Your blood pressure, heart rate, breathing rate, and blood oxygen level will be monitored often until the medicines you were given have worn off.  Return to your normal activities as directed by your health care provider. This information is not intended to replace advice given to you by your health care provider. Make sure you discuss any questions you have with your health care provider. Document Released:  07/10/2011 Document Revised: 04/18/2016 Document Reviewed: 12/19/2014 Elsevier Interactive Patient Education  2017 [employer].

## 2017-02-06 DIAGNOSIS — I110 Hypertensive heart disease with heart failure: Secondary | ICD-10-CM | POA: Diagnosis not present

## 2017-02-06 DIAGNOSIS — J181 Lobar pneumonia, unspecified organism: Secondary | ICD-10-CM | POA: Diagnosis not present

## 2017-02-06 DIAGNOSIS — I739 Peripheral vascular disease, unspecified: Secondary | ICD-10-CM | POA: Diagnosis not present

## 2017-02-06 DIAGNOSIS — J440 Chronic obstructive pulmonary disease with acute lower respiratory infection: Secondary | ICD-10-CM | POA: Diagnosis not present

## 2017-02-06 DIAGNOSIS — M810 Age-related osteoporosis without current pathological fracture: Secondary | ICD-10-CM | POA: Diagnosis not present

## 2017-02-06 DIAGNOSIS — E43 Unspecified severe protein-calorie malnutrition: Secondary | ICD-10-CM | POA: Diagnosis not present

## 2017-02-06 DIAGNOSIS — G629 Polyneuropathy, unspecified: Secondary | ICD-10-CM | POA: Diagnosis not present

## 2017-02-06 DIAGNOSIS — I509 Heart failure, unspecified: Secondary | ICD-10-CM | POA: Diagnosis not present

## 2017-02-06 DIAGNOSIS — M47816 Spondylosis without myelopathy or radiculopathy, lumbar region: Secondary | ICD-10-CM | POA: Diagnosis not present

## 2017-02-07 ENCOUNTER — Other Ambulatory Visit: Payer: Self-pay | Admitting: *Deleted

## 2017-02-07 DIAGNOSIS — G629 Polyneuropathy, unspecified: Secondary | ICD-10-CM | POA: Diagnosis not present

## 2017-02-07 DIAGNOSIS — M810 Age-related osteoporosis without current pathological fracture: Secondary | ICD-10-CM | POA: Diagnosis not present

## 2017-02-07 DIAGNOSIS — M47816 Spondylosis without myelopathy or radiculopathy, lumbar region: Secondary | ICD-10-CM | POA: Diagnosis not present

## 2017-02-07 DIAGNOSIS — J181 Lobar pneumonia, unspecified organism: Secondary | ICD-10-CM | POA: Diagnosis not present

## 2017-02-07 DIAGNOSIS — I509 Heart failure, unspecified: Secondary | ICD-10-CM | POA: Diagnosis not present

## 2017-02-07 DIAGNOSIS — J440 Chronic obstructive pulmonary disease with acute lower respiratory infection: Secondary | ICD-10-CM | POA: Diagnosis not present

## 2017-02-07 DIAGNOSIS — I110 Hypertensive heart disease with heart failure: Secondary | ICD-10-CM | POA: Diagnosis not present

## 2017-02-07 DIAGNOSIS — I739 Peripheral vascular disease, unspecified: Secondary | ICD-10-CM | POA: Diagnosis not present

## 2017-02-07 DIAGNOSIS — E43 Unspecified severe protein-calorie malnutrition: Secondary | ICD-10-CM | POA: Diagnosis not present

## 2017-02-07 NOTE — Patient Outreach (Signed)
[facility] Care Management  02/07/2017  Sueanne MargaritaMary George Ra 27-Sep-1951 [id number]   Phone call to patient to assess for community resource needs post discharge from the SNF.  Voicemail message full, no message could be left. Attempt made to call additional number listed in chart, however no answer.    Adriana ReamsChrystal Land, LCSW [facility] Care [facility] [phone]

## 2017-02-10 ENCOUNTER — Other Ambulatory Visit: Payer: Self-pay | Admitting: *Deleted

## 2017-02-10 DIAGNOSIS — J181 Lobar pneumonia, unspecified organism: Secondary | ICD-10-CM | POA: Diagnosis not present

## 2017-02-10 DIAGNOSIS — J440 Chronic obstructive pulmonary disease with acute lower respiratory infection: Secondary | ICD-10-CM | POA: Diagnosis not present

## 2017-02-10 DIAGNOSIS — I739 Peripheral vascular disease, unspecified: Secondary | ICD-10-CM | POA: Diagnosis not present

## 2017-02-10 NOTE — Patient Outreach (Signed)
[facility]) Care Management  02/10/2017  Cheryl [name] 03/20/1951 [id number]  Phone call to patient to assess for any additional community resource needs following her stay at the skilled nursing facility. HIPPA complaint voicemail message left requesting a return call.    Adriana ReamsChrystal Land, LCSW [facility] Care Management [phone]

## 2017-02-11 DIAGNOSIS — E43 Unspecified severe protein-calorie malnutrition: Secondary | ICD-10-CM | POA: Diagnosis not present

## 2017-02-11 DIAGNOSIS — M810 Age-related osteoporosis without current pathological fracture: Secondary | ICD-10-CM | POA: Diagnosis not present

## 2017-02-11 DIAGNOSIS — J181 Lobar pneumonia, unspecified organism: Secondary | ICD-10-CM | POA: Diagnosis not present

## 2017-02-11 DIAGNOSIS — I110 Hypertensive heart disease with heart failure: Secondary | ICD-10-CM | POA: Diagnosis not present

## 2017-02-11 DIAGNOSIS — I739 Peripheral vascular disease, unspecified: Secondary | ICD-10-CM | POA: Diagnosis not present

## 2017-02-11 DIAGNOSIS — M47816 Spondylosis without myelopathy or radiculopathy, lumbar region: Secondary | ICD-10-CM | POA: Diagnosis not present

## 2017-02-11 DIAGNOSIS — G629 Polyneuropathy, unspecified: Secondary | ICD-10-CM | POA: Diagnosis not present

## 2017-02-11 DIAGNOSIS — I509 Heart failure, unspecified: Secondary | ICD-10-CM | POA: Diagnosis not present

## 2017-02-11 DIAGNOSIS — J440 Chronic obstructive pulmonary disease with acute lower respiratory infection: Secondary | ICD-10-CM | POA: Diagnosis not present

## 2017-02-11 DIAGNOSIS — J449 Chronic obstructive pulmonary disease, unspecified: Secondary | ICD-10-CM | POA: Diagnosis not present

## 2017-02-13 ENCOUNTER — Other Ambulatory Visit: Payer: Self-pay | Admitting: *Deleted

## 2017-02-13 DIAGNOSIS — M810 Age-related osteoporosis without current pathological fracture: Secondary | ICD-10-CM | POA: Diagnosis not present

## 2017-02-13 DIAGNOSIS — I110 Hypertensive heart disease with heart failure: Secondary | ICD-10-CM | POA: Diagnosis not present

## 2017-02-13 DIAGNOSIS — J181 Lobar pneumonia, unspecified organism: Secondary | ICD-10-CM | POA: Diagnosis not present

## 2017-02-13 DIAGNOSIS — J440 Chronic obstructive pulmonary disease with acute lower respiratory infection: Secondary | ICD-10-CM | POA: Diagnosis not present

## 2017-02-13 DIAGNOSIS — E43 Unspecified severe protein-calorie malnutrition: Secondary | ICD-10-CM | POA: Diagnosis not present

## 2017-02-13 DIAGNOSIS — J449 Chronic obstructive pulmonary disease, unspecified: Secondary | ICD-10-CM | POA: Diagnosis not present

## 2017-02-13 DIAGNOSIS — I509 Heart failure, unspecified: Secondary | ICD-10-CM | POA: Diagnosis not present

## 2017-02-13 DIAGNOSIS — G629 Polyneuropathy, unspecified: Secondary | ICD-10-CM | POA: Diagnosis not present

## 2017-02-13 DIAGNOSIS — I739 Peripheral vascular disease, unspecified: Secondary | ICD-10-CM | POA: Diagnosis not present

## 2017-02-13 DIAGNOSIS — M47816 Spondylosis without myelopathy or radiculopathy, lumbar region: Secondary | ICD-10-CM | POA: Diagnosis not present

## 2017-02-13 NOTE — Patient Outreach (Signed)
[facility]) Care Management  02/13/2017  Cheryl [name] [birth year] [id number]   Phone call to patient to assess for any additional community resource needs following her SNF stay at [facility]. Marland Kitchen. HIPPA compliant voicemail message left requesting a return call.    Adriana ReamsChrystal Gurley Climer, LCSW [facility] Care Management [phone]

## 2017-02-14 ENCOUNTER — Other Ambulatory Visit: Payer: Self-pay | Admitting: *Deleted

## 2017-02-14 ENCOUNTER — Telehealth: Payer: Self-pay | Admitting: Family Medicine

## 2017-02-14 ENCOUNTER — Encounter: Payer: Self-pay | Admitting: *Deleted

## 2017-02-14 DIAGNOSIS — J181 Lobar pneumonia, unspecified organism: Secondary | ICD-10-CM | POA: Diagnosis not present

## 2017-02-14 DIAGNOSIS — M47816 Spondylosis without myelopathy or radiculopathy, lumbar region: Secondary | ICD-10-CM | POA: Diagnosis not present

## 2017-02-14 DIAGNOSIS — I110 Hypertensive heart disease with heart failure: Secondary | ICD-10-CM | POA: Diagnosis not present

## 2017-02-14 DIAGNOSIS — I509 Heart failure, unspecified: Secondary | ICD-10-CM | POA: Diagnosis not present

## 2017-02-14 DIAGNOSIS — I739 Peripheral vascular disease, unspecified: Secondary | ICD-10-CM | POA: Diagnosis not present

## 2017-02-14 DIAGNOSIS — M810 Age-related osteoporosis without current pathological fracture: Secondary | ICD-10-CM | POA: Diagnosis not present

## 2017-02-14 DIAGNOSIS — E43 Unspecified severe protein-calorie malnutrition: Secondary | ICD-10-CM | POA: Diagnosis not present

## 2017-02-14 DIAGNOSIS — G629 Polyneuropathy, unspecified: Secondary | ICD-10-CM | POA: Diagnosis not present

## 2017-02-14 DIAGNOSIS — J440 Chronic obstructive pulmonary disease with acute lower respiratory infection: Secondary | ICD-10-CM | POA: Diagnosis not present

## 2017-02-14 NOTE — Telephone Encounter (Signed)
[name] saying she spoke with her sister today stating Mrs. Cheryl [name] was feeling weak today.  Not as alert.  Oxygen level 90%.  Home health nurse was called in for eval.  This is FYI.  Thank sTeri

## 2017-02-14 NOTE — Patient Outreach (Signed)
[facility]) Care Management  02/14/2017  Cheryl MargaritaMary George [name] 18-Nov-1951 [id number]   Transition of care call  Placed call to patient cell phone number, person answering phone identified self as patient's sister , she states [name] is very weak on today when she arrived at her home. She was able to assist patient to recliner chair. She describes patient as being weak, not as alert as yesterday, she denies patient breathing being faster or distress, denies skin temperature feeling  as if she has a fever. Patient is not wearing her oxygen. Instructed to place oxygen on patient at 2 liters as she wears at night. Advised regarding calling 911 for emergency she states she has not been told by Mr.Maresh to call . Patient's sister is searching for pulse oximeter in home.  Placed call to Cheryl [name], he reports patient was a little weaker in the evening yesterday but was okay when he left the house this morning. He states he as told sister to place oxygen on , and check her oxygen level and call him back and if it is low he will come and take her to emergency room. He also [facility] today and she will come earlier. Instructed regarding 911 best option for respiratory emergency,he again states if  oxygen level is low he will get her to hospital    1020 Returned call to patient she answers the phone and states her oxygen level is up to 90 % and she doesn't feel she need to put oxygen on at this time. Oriented to person place time questions. Patient states she has spells like this at times.  Offered to call PCP office regarding her symptoms for recommendations, possible visit today, patient declines need for MD visit but will wait on home health RN to check her out.   Plan Patient will seek emergency help for worsening respiratory symptoms.  Will place call to PCP office regarding patient current symptoms, able to speak with Terri.   Will  follow up with patient on the next business day.   Egbert GaribaldiKimberly Kelsen Celona, RN, [facility] Care Management [phone] Mobile [phone]- Toll Free Main Office

## 2017-02-14 NOTE — Telephone Encounter (Signed)
This encounter was created in error - please disregard.

## 2017-02-18 DIAGNOSIS — I739 Peripheral vascular disease, unspecified: Secondary | ICD-10-CM | POA: Diagnosis not present

## 2017-02-18 DIAGNOSIS — J181 Lobar pneumonia, unspecified organism: Secondary | ICD-10-CM | POA: Diagnosis not present

## 2017-02-18 DIAGNOSIS — M47816 Spondylosis without myelopathy or radiculopathy, lumbar region: Secondary | ICD-10-CM | POA: Diagnosis not present

## 2017-02-18 DIAGNOSIS — I509 Heart failure, unspecified: Secondary | ICD-10-CM | POA: Diagnosis not present

## 2017-02-18 DIAGNOSIS — I110 Hypertensive heart disease with heart failure: Secondary | ICD-10-CM | POA: Diagnosis not present

## 2017-02-18 DIAGNOSIS — M810 Age-related osteoporosis without current pathological fracture: Secondary | ICD-10-CM | POA: Diagnosis not present

## 2017-02-18 DIAGNOSIS — E43 Unspecified severe protein-calorie malnutrition: Secondary | ICD-10-CM | POA: Diagnosis not present

## 2017-02-18 DIAGNOSIS — J440 Chronic obstructive pulmonary disease with acute lower respiratory infection: Secondary | ICD-10-CM | POA: Diagnosis not present

## 2017-02-18 DIAGNOSIS — G629 Polyneuropathy, unspecified: Secondary | ICD-10-CM | POA: Diagnosis not present

## 2017-02-19 ENCOUNTER — Other Ambulatory Visit: Payer: Self-pay | Admitting: *Deleted

## 2017-02-19 NOTE — Patient Outreach (Signed)
[facility]) Care Management  02/19/2017  Cheryl [name] 09-07-1951 [id number]   Telephone follow up.  Follow up call to patient reports she is feeling much than she was on last week when we spoke on the phone. Patient discussed I just have spells like that at times, where I am just out of it I have told my doctor about it,  by the time the home health nurse arrived I was okay and have been since then.   Patient denies having any increase in shortness of breath, she is able to tolerate ambulation in home and out to porch as usual.  Patient denies any new concerns at this time. Patient has completed 31 days of transition of care program and not readmissions, will continue to follow for complex care management per program.  Plan Will plan follow up telephone call to patient in the next 2 weeks.  Patient will notify MD of worsening of COPD symptoms or new concerns, and ED red zone symptoms  Egbert GaribaldiKimberly Glover, RN, [facility] Care Management [phone]- Mobile [phone]- Toll Free Main Office

## 2017-02-24 ENCOUNTER — Other Ambulatory Visit: Payer: Self-pay | Admitting: Pain Medicine

## 2017-02-24 DIAGNOSIS — G894 Chronic pain syndrome: Secondary | ICD-10-CM

## 2017-02-26 DIAGNOSIS — E43 Unspecified severe protein-calorie malnutrition: Secondary | ICD-10-CM | POA: Diagnosis not present

## 2017-02-26 DIAGNOSIS — M810 Age-related osteoporosis without current pathological fracture: Secondary | ICD-10-CM | POA: Diagnosis not present

## 2017-02-26 DIAGNOSIS — I739 Peripheral vascular disease, unspecified: Secondary | ICD-10-CM | POA: Diagnosis not present

## 2017-02-26 DIAGNOSIS — J181 Lobar pneumonia, unspecified organism: Secondary | ICD-10-CM | POA: Diagnosis not present

## 2017-02-26 DIAGNOSIS — G629 Polyneuropathy, unspecified: Secondary | ICD-10-CM | POA: Diagnosis not present

## 2017-02-26 DIAGNOSIS — M47816 Spondylosis without myelopathy or radiculopathy, lumbar region: Secondary | ICD-10-CM | POA: Diagnosis not present

## 2017-02-26 DIAGNOSIS — J440 Chronic obstructive pulmonary disease with acute lower respiratory infection: Secondary | ICD-10-CM | POA: Diagnosis not present

## 2017-02-26 DIAGNOSIS — I110 Hypertensive heart disease with heart failure: Secondary | ICD-10-CM | POA: Diagnosis not present

## 2017-02-26 DIAGNOSIS — I509 Heart failure, unspecified: Secondary | ICD-10-CM | POA: Diagnosis not present

## 2017-02-27 DIAGNOSIS — R0602 Shortness of breath: Secondary | ICD-10-CM | POA: Diagnosis not present

## 2017-02-27 DIAGNOSIS — I2510 Atherosclerotic heart disease of native coronary artery without angina pectoris: Secondary | ICD-10-CM | POA: Diagnosis not present

## 2017-03-06 ENCOUNTER — Ambulatory Visit: Payer: Medicare HMO | Attending: Pain Medicine | Admitting: Pain Medicine

## 2017-03-06 ENCOUNTER — Encounter: Payer: Self-pay | Admitting: Pain Medicine

## 2017-03-06 ENCOUNTER — Other Ambulatory Visit: Payer: Self-pay | Admitting: *Deleted

## 2017-03-06 VITALS — BP 135/104 | HR 88 | Temp 99.5°F | Resp 16 | Ht <= 58 in | Wt 92.0 lb

## 2017-03-06 DIAGNOSIS — Z7901 Long term (current) use of anticoagulants: Secondary | ICD-10-CM | POA: Diagnosis not present

## 2017-03-06 DIAGNOSIS — M79604 Pain in right leg: Secondary | ICD-10-CM

## 2017-03-06 DIAGNOSIS — Z981 Arthrodesis status: Secondary | ICD-10-CM | POA: Insufficient documentation

## 2017-03-06 DIAGNOSIS — F419 Anxiety disorder, unspecified: Secondary | ICD-10-CM | POA: Diagnosis not present

## 2017-03-06 DIAGNOSIS — Z7902 Long term (current) use of antithrombotics/antiplatelets: Secondary | ICD-10-CM | POA: Diagnosis not present

## 2017-03-06 DIAGNOSIS — E785 Hyperlipidemia, unspecified: Secondary | ICD-10-CM | POA: Insufficient documentation

## 2017-03-06 DIAGNOSIS — J449 Chronic obstructive pulmonary disease, unspecified: Secondary | ICD-10-CM | POA: Diagnosis not present

## 2017-03-06 DIAGNOSIS — Z7951 Long term (current) use of inhaled steroids: Secondary | ICD-10-CM | POA: Diagnosis not present

## 2017-03-06 DIAGNOSIS — M545 Low back pain: Secondary | ICD-10-CM | POA: Diagnosis not present

## 2017-03-06 DIAGNOSIS — G629 Polyneuropathy, unspecified: Secondary | ICD-10-CM | POA: Diagnosis not present

## 2017-03-06 DIAGNOSIS — I10 Essential (primary) hypertension: Secondary | ICD-10-CM | POA: Diagnosis not present

## 2017-03-06 DIAGNOSIS — F1190 Opioid use, unspecified, uncomplicated: Secondary | ICD-10-CM

## 2017-03-06 DIAGNOSIS — I2510 Atherosclerotic heart disease of native coronary artery without angina pectoris: Secondary | ICD-10-CM | POA: Diagnosis not present

## 2017-03-06 DIAGNOSIS — I739 Peripheral vascular disease, unspecified: Secondary | ICD-10-CM | POA: Diagnosis not present

## 2017-03-06 DIAGNOSIS — G894 Chronic pain syndrome: Secondary | ICD-10-CM

## 2017-03-06 DIAGNOSIS — E7800 Pure hypercholesterolemia, unspecified: Secondary | ICD-10-CM | POA: Insufficient documentation

## 2017-03-06 DIAGNOSIS — Z8249 Family history of ischemic heart disease and other diseases of the circulatory system: Secondary | ICD-10-CM | POA: Insufficient documentation

## 2017-03-06 DIAGNOSIS — M47816 Spondylosis without myelopathy or radiculopathy, lumbar region: Secondary | ICD-10-CM

## 2017-03-06 DIAGNOSIS — K219 Gastro-esophageal reflux disease without esophagitis: Secondary | ICD-10-CM | POA: Diagnosis not present

## 2017-03-06 DIAGNOSIS — Z5181 Encounter for therapeutic drug level monitoring: Secondary | ICD-10-CM | POA: Diagnosis not present

## 2017-03-06 DIAGNOSIS — M5442 Lumbago with sciatica, left side: Secondary | ICD-10-CM | POA: Diagnosis not present

## 2017-03-06 DIAGNOSIS — M4726 Other spondylosis with radiculopathy, lumbar region: Secondary | ICD-10-CM | POA: Insufficient documentation

## 2017-03-06 DIAGNOSIS — M488X6 Other specified spondylopathies, lumbar region: Secondary | ICD-10-CM | POA: Diagnosis not present

## 2017-03-06 DIAGNOSIS — G8929 Other chronic pain: Secondary | ICD-10-CM

## 2017-03-06 DIAGNOSIS — E559 Vitamin D deficiency, unspecified: Secondary | ICD-10-CM | POA: Diagnosis not present

## 2017-03-06 DIAGNOSIS — M797 Fibromyalgia: Secondary | ICD-10-CM | POA: Insufficient documentation

## 2017-03-06 DIAGNOSIS — Z8261 Family history of arthritis: Secondary | ICD-10-CM | POA: Insufficient documentation

## 2017-03-06 DIAGNOSIS — Z885 Allergy status to narcotic agent status: Secondary | ICD-10-CM | POA: Diagnosis not present

## 2017-03-06 DIAGNOSIS — M4696 Unspecified inflammatory spondylopathy, lumbar region: Secondary | ICD-10-CM

## 2017-03-06 DIAGNOSIS — F17210 Nicotine dependence, cigarettes, uncomplicated: Secondary | ICD-10-CM | POA: Insufficient documentation

## 2017-03-06 DIAGNOSIS — Z886 Allergy status to analgesic agent status: Secondary | ICD-10-CM | POA: Insufficient documentation

## 2017-03-06 DIAGNOSIS — M5441 Lumbago with sciatica, right side: Secondary | ICD-10-CM

## 2017-03-06 DIAGNOSIS — M810 Age-related osteoporosis without current pathological fracture: Secondary | ICD-10-CM | POA: Diagnosis not present

## 2017-03-06 DIAGNOSIS — Z833 Family history of diabetes mellitus: Secondary | ICD-10-CM | POA: Insufficient documentation

## 2017-03-06 DIAGNOSIS — Z882 Allergy status to sulfonamides status: Secondary | ICD-10-CM | POA: Insufficient documentation

## 2017-03-06 DIAGNOSIS — Z79891 Long term (current) use of opiate analgesic: Secondary | ICD-10-CM | POA: Insufficient documentation

## 2017-03-06 DIAGNOSIS — Z79899 Other long term (current) drug therapy: Secondary | ICD-10-CM | POA: Insufficient documentation

## 2017-03-06 MED ORDER — FENTANYL 50 MCG/HR TD PT72: 50.0000 ug | MEDICATED_PATCH | TRANSDERMAL | 0 refills | Status: DC

## 2017-03-06 MED ORDER — TRAMADOL HCL 50 MG PO TABS: 100.0000 mg | ORAL_TABLET | Freq: Four times a day (QID) | ORAL | 2 refills | Status: DC | PRN

## 2017-03-06 NOTE — Progress Notes (Signed)
Patient's Name: Cheryl [name]  MRN: [id number]  Referring Provider: Birdie Sons, MD  DOB: 1951/06/08  PCP: Birdie Sons, MD  DOS: 03/06/2017  Note by: Kathlen Brunswick. Dossie Arbour, MD  Service setting: Ambulatory outpatient  Specialty: Interventional Pain Management  Location: [facility] (AMB) Pain Management Facility    Patient type: Established   Primary Reason(s) for Visit: Encounter for prescription drug management (Level of risk: moderate) CC: Back Pain (lower to left flank area)  HPI  Cheryl [name] is a 66 y.o. year old, female patient, who comes today for a medication management evaluation. She has Essential hypertension; GERD (gastroesophageal reflux disease); Hyperlipemia; Anxiety; Airway hyperreactivity; Back pain, thoracic; Coronary artery disease; Excessive falling; Alteration in bowel elimination: incontinence; Insomnia; Decreased testosterone level; Leg weakness; Menopausal symptom; Migraine; Neuropathy ([place]); Fecal occult blood test positive; OP (osteoporosis); Panic disorder; Compulsive tobacco user syndrome; Urinary incontinence; Vitamin D deficiency; Weight loss; COPD (chronic obstructive pulmonary disease) ([place]); Dementia; Lumbar radicular pain (Bilateral) (L>R) (L4); Chronic low back pain (Location of Primary Source of Pain) (Bilateral) (L>R); Encounter for therapeutic drug level monitoring; Uncomplicated opioid dependence ([place]); Chronic pain syndrome; Hypercholesteremia; Peripheral nerve disease; Peripheral vascular disease ([place]); Platelet inhibition due to Plavix; Abnormal mammogram of right breast; Dilated intrahepatic bile duct; Sepsis ([place]); Dysphagia; Acute diastolic CHF (congestive heart failure) ([place]); Leukocytosis; Long term current use of opiate analgesic; Fibromyalgia; Osteoarthrosis; Long term current use of anticoagulant therapy (Plavix); Opiate use (160 MME/Day); Anemia; Lumbar facet syndrome (Location of Primary Source of Pain) (Bilateral) (L>R); Lumbar spondylosis; Nausea with  vomiting; Protein-calorie malnutrition, severe; Metabolic encephalopathy; and Hypokalemia on her problem list. Her primarily concern today is the Back Pain (lower to left flank area)  Pain Assessment: Self-Reported Pain Score: 3 /10             Reported level is compatible with observation.       Pain Type: Chronic pain Pain Location: Back Pain Orientation: Lower Pain Descriptors / Indicators: Aching, Constant, Sharp Pain Frequency: Constant  Cheryl [name] was last scheduled for an appointment on 02/24/2017 for medication management. During today's appointment we reviewed Cheryl [name]'s chronic pain status, as well as her outpatient medication regimen.  The patient  reports that she does not use drugs. Her body mass index is 19.23 kg/m.  Further details on both, my assessment(s), as well as the proposed treatment plan, please see below.  Controlled Substance Pharmacotherapy Assessment REMS (Risk Evaluation and Mitigation Strategy)  Analgesic:Duragesic 50 mcg/h every 72 hours + Tramadol 100 mg PO q6hrs (400 mg/day) MME/day:160 mg/day.  Evon Slack, RN  03/06/2017  1:53 PM  Sign at close encounter Nursing Pain Medication Assessment:  Safety precautions to be maintained throughout the outpatient stay will include: orient to surroundings, keep bed in low position, maintain call bell within reach at all times, provide assistance with transfer out of bed and ambulation.  Medication Inspection Compliance: Pill count conducted under aseptic conditions, in front of the patient. Neither the pills nor the bottle was removed from the patient's sight at any time. Once count was completed pills were immediately returned to the patient in their original bottle.  Medication #1: Tramadol (Ultram) Pill/Patch Count: 154 of 240 pills remain Pill/Patch Appearance: Markings consistent with prescribed medication Bottle Appearance: Standard pharmacy container. Clearly labeled. Filled Date:03 / 30 / 2018 Last  Medication intake:  Today  Medication #2: Fentanyl patch Pill/Patch Count: 10 of 10 pills remain Pill/Patch Appearance: Markings consistent with prescribed medication Bottle Appearance: Standard pharmacy container. Clearly  labeled. Filled Date: 04 / 11 / 2018 Last Medication intake:  Day before yesterday   Pharmacokinetics: Liberation and absorption (onset of action): WNL Distribution (time to peak effect): WNL Metabolism and excretion (duration of action): WNL         Pharmacodynamics: Desired effects: Analgesia: Cheryl [name] reports >50% benefit. Functional ability: Patient reports that medication allows her to accomplish basic ADLs Clinically meaningful improvement in function (CMIF): Sustained CMIF goals met Perceived effectiveness: Described as relatively effective, allowing for increase in activities of daily living (ADL) Undesirable effects: Side-effects or Adverse reactions: None reported Monitoring: [place] PMP: Online review of the past 40-monthperiod conducted. Compliant with practice rules and regulations List of all UDS test(s) done:  Lab Results  Component Value Date   TOXASSSELUR FINAL 12/05/2016   [place]AL 03/20/2016   [place]ates[place]AL 02/28/2016   [place] 12/04/2015   Last UDS on record: ToxAssure Select 13  Date Value Ref Range Status  12/05/2016 FINAL  Final    Comment:    ==================================================================== TOXASSURE SELECT 13 (MW) ==================================================================== Test                             Result       Flag       Units Drug Present and Declared for Prescription Verification   Fentanyl                       34           EXPECTED   ng/mg creat   Norfentanyl                    258          EXPECTED   ng/mg creat    Source of fentanyl is a scheduled prescription medication,    including IV, patch, and transmucosal formulations. Norfentanyl    is an expected metabolite  of fentanyl.   Tramadol                       PRESENT      EXPECTED   O-Desmethyltramadol            PRESENT      EXPECTED   N-Desmethyltramadol            PRESENT      EXPECTED    Source of tramadol is a prescription medication.    O-desmethyltramadol and N-desmethyltramadol are expected    metabolites of tramadol. Drug Absent but Declared for Prescription Verification   Alprazolam                     Not Detected UNEXPECTED ng/mg creat ==================================================================== Test                      Result    Flag   Units      Ref Range   Creatinine              197              mg/dL      >=20 ==================================================================== Declared Medications:  The flagging and interpretation on this report are based on the  following declared medications.  Unexpected results may arise from  inaccuracies in the declared medications.  **Note: The testing scope of this panel includes these medications:  Alprazolam (Xanax)  Fentanyl (Duragesic)  Tramadol (Ultram)  **Note: The testing scope of this panel does not include following  reported medications:  Acetaminophen  Albuterol  Albuterol (Proventil)  Atropine (Lomotil)  Celecoxib (Celebrex)  Clopidogrel (Plavix)  Cyclobenzaprine (Flexeril)  Diphenoxylate (Lomotil)  Donepezil (Aricept)  Estropipate  Fluticasone (Advair)  Fluticasone (Flonase)  Furosemide (Lasix)  Mupirocin (Bactroban)  Pantoprazole  Potassium  Pregabalin (Lyrica)  Salmeterol (Advair)  Simvastatin (Zocor)  Sumatriptan (Imitrex)  Tiotropium (Spiriva)  Triamcinolone (Kenalog) ==================================================================== For clinical consultation, please call [phone]. ====================================================================    UDS interpretation: Compliant          Medication Assessment Form: Reviewed. Patient indicates being compliant with therapy Treatment  compliance: Compliant Risk Assessment Profile: Aberrant behavior: See prior evaluations. None observed or detected today Comorbid factors increasing risk of overdose: See prior notes. No additional risks detected today Risk of substance use disorder (SUD): Low Opioid Risk Tool (ORT) Total Score:    Interpretation Table:  Score <3 = Low Risk for SUD  Score between 4-7 = Moderate Risk for SUD  Score >8 = High Risk for Opioid Abuse   Risk Mitigation Strategies:  Patient Counseling: Covered Patient-Prescriber Agreement (PPA): Present and active  Notification to other healthcare providers: Done  Pharmacologic Plan: No change in therapy, at this time  Laboratory Chemistry  Inflammation Markers Lab Results  Component Value Date   CRP 0.6 03/01/2016   ESRSEDRATE 52 (H) 03/01/2016   (CRP: Acute Phase) (ESR: Chronic Phase) Renal Function Markers Lab Results  Component Value Date   BUN 13 01/05/2017   CREATININE 0.94 01/05/2017   GFRAA >60 01/05/2017   GFRNONAA >60 01/05/2017   Hepatic Function Markers Lab Results  Component Value Date   AST 30 01/05/2017   ALT 12 (L) 01/05/2017   ALBUMIN 3.3 (L) 01/05/2017   ALKPHOS 117 01/05/2017   Electrolytes Lab Results  Component Value Date   NA 135 01/05/2017   K 4.4 01/05/2017   CL 99 (L) 01/05/2017   CALCIUM 8.6 (L) 01/05/2017   MG 1.9 12/23/2016   Neuropathy Markers Lab Results  Component Value Date   VITAMINB12 242 03/01/2016   Bone Pathology Markers Lab Results  Component Value Date   ALKPHOS 117 01/05/2017   VD25OH 25.6 (L) 01/17/2016   CALCIUM 8.6 (L) 01/05/2017   Coagulation Parameters Lab Results  Component Value Date   INR 0.97 11/05/2016   LABPROT 12.9 11/05/2016   APTT 38 (H) 10/17/2015   PLT 375 01/05/2017   Cardiovascular Markers Lab Results  Component Value Date   BNP 35.0 01/05/2017   HGB 9.2 (L) 01/05/2017   HCT 27.8 (L) 01/05/2017   Note: Lab results reviewed.  Recent Diagnostic Imaging  Review  Dg Chest 2 View  Result Date: 01/29/2017 CLINICAL DATA:  Follow-up of pneumonia from 2 weeks ago. Persistent cough. History of asthma -COPD. Current smoker. EXAM: CHEST  2 VIEW COMPARISON:  Chest x-ray of January 05, 2017 FINDINGS: The lungs remain mildly hyper inflated. Right lower lobe infiltrate has cleared. On the left there is persistent increased density in the lingula which is less conspicuous than on the previous study. There is no new infiltrate. There is no pleural effusion. The heart is top-normal in size. The pulmonary vascularity is normal. There is calcification in the wall of the aortic arch. There is prominent thoracic kyphosis due to stable wedge compression of T11. IMPRESSION: Persistent increased density in the lingula. Interval clearing of right lower lobe infiltrate. An additional follow-up chest x-ray in 2-3 weeks is  recommended to assure complete clearing. Thoracic aortic atherosclerosis. Electronically Signed   By: David  Martinique M.D.   On: 01/29/2017 07:19   Note: Imaging results reviewed.          Meds  The patient has a current medication list which includes the following prescription(s): acetaminophen, advair diskus, albuterol, albuterol, alprazolam, benzonatate, celecoxib, clopidogrel, diphenoxylate-atropine, donepezil, estropipate, fentanyl, fentanyl, fentanyl, fluticasone, furosemide, lyrica, mupirocin ointment, pantoprazole, simvastatin, spiriva handihaler, sumatriptan, tramadol, triamcinolone ointment, and pregabalin.  Current Outpatient Prescriptions on File Prior to Visit  Medication Sig  . acetaminophen (TYLENOL) 325 MG tablet Take 2 tablets (650 mg total) by mouth every 6 (six) hours as needed for mild pain (or Fever >/= 101).  . ADVAIR DISKUS 250-50 MCG/DOSE AEPB inhale 1 dose by mouth twice a day  . albuterol (PROVENTIL HFA;VENTOLIN HFA) 108 (90 BASE) MCG/ACT inhaler Inhale 2 puffs into the lungs every 4 (four) hours as needed for wheezing or shortness of  breath.  Marland Kitchen albuterol (PROVENTIL) (2.5 MG/3ML) 0.083% nebulizer solution Take 3 mLs (2.5 mg total) by nebulization every 4 (four) hours as needed for wheezing.  Marland Kitchen ALPRAZolam (XANAX) 0.5 MG tablet Take by mouth as needed.   . benzonatate (TESSALON PERLES) 100 MG capsule Take 1 capsule (100 mg total) by mouth 3 (three) times daily as needed for cough.  . celecoxib (CELEBREX) 100 MG capsule Take 1 capsule (100 mg total) by mouth 2 (two) times daily as needed.  . clopidogrel (PLAVIX) 75 MG tablet Take 75 mg by mouth daily.  . diphenoxylate-atropine (LOMOTIL) 2.5-0.025 MG per tablet Take 2 tablets by mouth 4 (four) times daily as needed for diarrhea or loose stools. Reported on 03/11/2016  . donepezil (ARICEPT) 10 MG tablet Take 10 mg by mouth at bedtime.  Marland Kitchen estropipate (OGEN) 0.75 MG tablet Take 1 tablet (0.75 mg total) by mouth every other day.  . fluticasone (FLONASE) 50 MCG/ACT nasal spray Place 2 sprays into both nostrils daily.  . furosemide (LASIX) 40 MG tablet 40 mg every other day.   Marland Kitchen LYRICA 150 MG capsule 150 mg 3 (three) times daily.   . mupirocin ointment (BACTROBAN) 2 % Place 1 application into the nose 2 (two) times daily. (Patient taking differently: Place 1 application into the nose 2 (two) times daily. Right lower leg twice daily)  . pantoprazole (PROTONIX) 40 MG tablet take 1 tablet by mouth twice a day  . simvastatin (ZOCOR) 10 MG tablet Take 10 mg by mouth at bedtime. Reported on 12/04/2015  . SPIRIVA HANDIHALER 18 MCG inhalation capsule inhale the contents of one capsule in the handihaler once daily  . SUMAtriptan (IMITREX) 25 MG tablet Take 25 mg by mouth as needed for migraine. May repeat in 2 hours if headache persists or recurs.  . triamcinolone ointment (KENALOG) 0.5 % Apply to lesions on hands twice a day as needed  . pregabalin (LYRICA) 75 MG capsule Take 1 capsule (75 mg total) by mouth 3 (three) times daily.   No current facility-administered medications on file prior to  visit.    ROS  Constitutional: Denies any fever or chills Gastrointestinal: No reported hemesis, hematochezia, vomiting, or acute GI distress Musculoskeletal: Denies any acute onset joint swelling, redness, loss of ROM, or weakness Neurological: No reported episodes of acute onset apraxia, aphasia, dysarthria, agnosia, amnesia, paralysis, loss of coordination, or loss of consciousness  Allergies  Ms. Mondor is allergic to percocet [oxycodone-acetaminophen]; aspirin; codeine; propoxyphene; and sulfa antibiotics.  [place]  Drug: Ms. Conkright  reports  that she does not use drugs. Alcohol:  reports that she does not drink alcohol. Tobacco:  reports that she has been smoking Cigarettes.  She has a 50.00 pack-year smoking history. She has never used smokeless tobacco. Medical:  has a past medical history of Allergy; Anxiety; Arthritis; Aspiration pneumonitis ([place]) (11/24/2015); Asthma; Chronic pain; COPD (chronic obstructive pulmonary disease) ([place]); Coronary artery disease; DVT (deep venous thrombosis) ([place]); GERD (gastroesophageal reflux disease); Headache; Hyperlipidemia; Hypertension; Migraines; Neuropathy (2010); Osteoporosis; Oxygen deficiency; Peripheral vascular disease ([place]); Pneumonia; Pneumonia (11/19/2015); Pneumonia (10/2016); and Vitamin D deficiency. Family: family history includes Arthritis in her mother; Cancer in her brother, brother, and mother; Diabetes in her brother, mother, and sister; Heart disease in her father and mother.  Past Surgical History:  Procedure Laterality Date  . ABDOMINAL HYSTERECTOMY  1975   Bilaterl Oophorectomy; [place] to IUD infection  . abdomnal aortic stent  05/30/2008   Dr. Quay Burow  . APPENDECTOMY    . APPENDECTOMY    . cardiac catherization  10/31/2009  . CERVICAL FUSION  C5 - 6/C6-7  . CHOLECYSTECTOMY  1972  . COLONOSCOPY WITH PROPOFOL N/A 07/27/2015   Procedure: COLONOSCOPY WITH PROPOFOL;  Surgeon: Hulen Luster, MD;  Location[facility] END[facility]COPY;  Service:  Gastroenterology;  Laterality: N/A;  . ESOPHAGOGASTRODUODENOSCOPY (EGD) WITH PROPOFOL N/A 07/27/2015   Procedure: ESOPHAGOGASTRODUODENOSCOPY (EGD) WITH PROPOFOL;  Surgeon: Hulen Luster, MD;  Location: [facility] ENDOSCOPY;  Service: Gastroenterology;  Laterality: N/A;  . FOOT SURGERY Bilateral    5-6 years per patient  . SPINE SURGERY     Constitutional Exam  General appearance: Well nourished, well developed, and well hydrated. In no apparent acute distress Vitals:   03/06/17 1337  BP: (!) 135/104  Pulse: 88  Resp: 16  Temp: 99.5 F (37.5 C)  TempSrc: Other (Comment)  SpO2: 100%  Weight: 92 lb (41.7 kg)  Height: _0  (1.473 m)   BMI Assessment: Estimated body mass index is 19.23 kg/m as calculated from the following:   Height as of this encounter: _1  (1.473 m).   Weight as of this encounter: 92 lb (41.7 kg).  BMI interpretation table: BMI level Category Range association with higher incidence of chronic pain  <18 kg/m2 Underweight   18.5-24.9 kg/m2 Ideal body weight   25-29.9 kg/m2 Overweight Increased incidence by 20%  30-34.9 kg/m2 Obese (Class I) Increased incidence by 68%  35-39.9 kg/m2 Severe obesity (Class II) Increased incidence by 136%  >40 kg/m2 Extreme obesity (Class III) Increased incidence by 254%   BMI Readings from Last 4 Encounters:  03/06/17 19.23 kg/m  02/05/17 20.06 kg/m  01/28/17 20.48 kg/m  01/22/17 20.06 kg/m   Wt Readings from Last 4 Encounters:  03/06/17 92 lb (41.7 kg)  02/05/17 96 lb (43.5 kg)  01/28/17 98 lb (44.5 kg)  01/22/17 96 lb (43.5 kg)  Psych/Mental status: Alert, oriented x 3 (person, place, & time)       Eyes: PERLA Respiratory: No evidence of acute respiratory distress  Cervical Spine Exam  Inspection: No masses, redness, or swelling Alignment: Symmetrical Functional ROM: Unrestricted ROM Stability: No instability detected Muscle strength & Tone: Functionally intact Sensory: Unimpaired Palpation: No palpable  anomalies  Upper Extremity (UE) Exam    Side: Right upper extremity  Side: Left upper extremity  Inspection: No masses, redness, swelling, or asymmetry. No contractures  Inspection: No masses, redness, swelling, or asymmetry. No contractures  Functional ROM: Unrestricted ROM          Functional ROM:  Unrestricted ROM          Muscle strength & Tone: Functionally intact  Muscle strength & Tone: Functionally intact  Sensory: Unimpaired  Sensory: Unimpaired  Palpation: No palpable anomalies  Palpation: No palpable anomalies  Specialized Test(s): Deferred         Specialized Test(s): Deferred          Thoracic Spine Exam  Inspection: Significant thoracic kyphosis Alignment: Asymmetric Functional ROM: Minimal ROM Stability: No instability detected Sensory: Unimpaired Muscle strength & Tone: No palpable anomalies  Lumbar Spine Exam  Inspection: Thoraco-lumbar Scoliosis Alignment: Symmetrical Functional ROM: Minimal ROM Stability: No instability detected Muscle strength & Tone: Functionally intact Sensory: Movement-associated pain Palpation: Tender Provocative Tests: Lumbar Hyperextension and rotation test: Positive bilaterally for facet joint pain. Patrick's Maneuver: evaluation deferred today              Gait & Posture Assessment  Ambulation: Unassisted Gait: Relatively normal for age and body habitus Posture: WNL   Lower Extremity Exam    Side: Right lower extremity  Side: Left lower extremity  Inspection: No masses, redness, swelling, or asymmetry. No contractures  Inspection: No masses, redness, swelling, or asymmetry. No contractures  Functional ROM: Unrestricted ROM          Functional ROM: Unrestricted ROM          Muscle strength & Tone: Functionally intact  Muscle strength & Tone: Functionally intact  Sensory: Unimpaired  Sensory: Unimpaired  Palpation: No palpable anomalies  Palpation: No palpable anomalies   Assessment  Primary Diagnosis & Pertinent Problem  List: The primary encounter diagnosis was Chronic pain syndrome. Diagnoses of Chronic low back pain (Location of Primary Source of Pain) (Bilateral) (L>R), Lumbar facet syndrome (Location of Primary Source of Pain) (Bilateral) (L>R), Long term current use of opiate analgesic, Long term current use of anticoagulant therapy (Plavix), Opiate use (160 MME/Day), and Pain of right lower extremity were also pertinent to this visit.  Status Diagnosis  Controlled Controlled Controlled 1. Chronic pain syndrome   2. Chronic low back pain (Location of Primary Source of Pain) (Bilateral) (L>R)   3. Lumbar facet syndrome (Location of Primary Source of Pain) (Bilateral) (L>R)   4. Long term current use of opiate analgesic   5. Long term current use of anticoagulant therapy (Plavix)   6. Opiate use (160 MME/Day)   7. Pain of right lower extremity      Plan of Care  Pharmacotherapy (Medications Ordered): Meds ordered this encounter  Medications  . traMADol (ULTRAM) 50 MG tablet    Sig: Take 2 tablets (100 mg total) by mouth every 6 (six) hours as needed for moderate pain or severe pain.    Dispense:  240 tablet    Refill:  2    Patient may have prescription filled one day early if pharmacy is closed on scheduled refill date. Do not fill until: 03/19/17 To last until: 06/17/17  . fentaNYL (DURAGESIC - DOSED MCG/HR) 50 MCG/HR    Sig: Place 1 patch (50 mcg total) onto the skin every 3 (three) days.    Dispense:  10 patch    Refill:  0    Patient may have prescription filled one day early if pharmacy is closed on scheduled refill date. Do not fill until: 03/19/17 To last until: 04/18/17  . fentaNYL (DURAGESIC - DOSED MCG/HR) 50 MCG/HR    Sig: Place 1 patch (50 mcg total) onto the skin every 3 (three) days.    Dispense:  10 patch    Refill:  0    Patient may have prescription filled one day early if pharmacy is closed on scheduled refill date. Do not fill until: 04/18/17 To last until: 05/18/17  .  fentaNYL (DURAGESIC - DOSED MCG/HR) 50 MCG/HR    Sig: Place 1 patch (50 mcg total) onto the skin every 3 (three) days.    Dispense:  10 patch    Refill:  0    Patient may have prescription filled one day early if pharmacy is closed on scheduled refill date. Do not fill until: 05/18/17 To last until: 06/17/17   New Prescriptions   No medications on file   Medications administered today: Ms. Battey had no medications administered during this visit. Lab-work, procedure(s), and/or referral(s): No orders of the defined types were placed in this encounter.  Imaging and/or referral(s): None  Interventional therapies: Planned, scheduled, and/or pending:   Left lumbar facet radiofrequency ablation under fluoroscopic guidance and IV sedation. Once we complete the left side will plan on doing the right side next.    Considering:   Bilateral lumbar facet radiofrequency ablation    Palliative PRN treatment(s):   Palliative bilateral lumbar facet block under fluoroscopic guidance and IV sedation   Provider-requested follow-up: Return in about 3 months (around 06/05/2017) for (Nurse Practitioner) Med-Mgmt, in addition, Procedure: Left lumbar facet RFA.  Future Appointments Date Time Provider [place]  03/10/2017 1:00 PM Alfonzo Feller, RN [facility] None  03/13/2017 10:30 AM Laverle Hobby, MD [facility] None  04/14/2017 10:45 AM Milinda Pointer, MD [facility] None  06/05/2017 1:15 PM [place], NP [facility] None   Primary Care Physician: Birdie Sons, MD Location: [facility] Outpatient [facility] Management Facility Note by: Kathlen Brunswick. Dossie Arbour, M.D, DABA, DABAPM, DABPM, DABIPP, FIPP Date: 03/06/2017; Time: 2:12 PM  Pain Score Disclaimer: We use the NRS-11 scale. This is a self-reported, subjective measurement of pain severity with only modest accuracy. It is used primarily to identify changes within a particular patient. It must be understood that outpatient pain scales are  significantly less accurate that those used for research, where they can be applied under ideal controlled circumstances with minimal exposure to variables. In reality, the score is likely to be a combination of pain intensity and pain affect, where pain affect describes the degree of emotional arousal or changes in action readiness caused by the sensory experience of pain. Factors such as social and work situation, setting, emotional state, anxiety levels, expectation, and prior pain experience may influence pain perception and show large inter-individual differences that may also be affected by time variables.  Patient instructions provided during this appointment: Patient Instructions  Preparing for Procedure with Sedation Instructions: . Oral Intake: Do not eat or drink anything for at least 8 hours prior to your procedure. . Transportation: Public transportation is not allowed. Bring an adult driver. The driver must be physically present in our waiting room before any procedure can be started. Marland Kitchen Physical Assistance: Bring an adult physically capable of assisting you, in the event you need help. This adult should keep you company at home for at least 6 hours after the procedure. . Blood Pressure Medicine: Take your blood pressure medicine with a sip of water the morning of the procedure. . Blood thinners:  . Diabetics on insulin: Notify the staff so that you can be scheduled 1st case in the morning. If your diabetes requires high dose insulin, take only  of your normal insulin dose the morning of  the procedure and notify the staff that you have done so. . Preventing infections: Shower with an antibacterial soap the morning of your procedure. . Build-up your immune system: Take 1000 mg of Vitamin C with every meal (3 times a day) the day prior to your procedure. Marland Kitchen Antibiotics: Inform the staff if you have a condition or reason that requires you to take antibiotics before dental  procedures. . Pregnancy: If you are pregnant, call and cancel the procedure. . Sickness: If you have a cold, fever, or any active infections, call and cancel the procedure. . Arrival: You must be in the facility at least 30 minutes prior to your scheduled procedure. . Children: Do not bring children with you. . Dress appropriately: Bring dark clothing that you would not mind if they get stained. . Valuables: Do not bring any jewelry or valuables. Procedure appointments are reserved for interventional treatments only. Marland Kitchen No Prescription Refills. . No medication changes will be discussed during procedure appointments. . No disability issues will be discussed.  ____________________________________________________________________________________________  Radiofrequency Lesioning, Care After Refer to this sheet in the next few weeks. These instructions provide you with information about caring for yourself after your procedure. Your health care provider may also give you more specific instructions. Your treatment has been planned according to current medical practices, but problems sometimes occur. Call your health care provider if you have any problems or questions after your procedure. What can I expect after the procedure? After the procedure, it is common to have:  Pain from the burned nerve.  Temporary numbness. Follow these instructions at home:  Take over-the-counter and prescription medicines only as told by your health care provider.  Return to your normal activities as told by your health care provider. Ask your health care provider what activities are safe for you.  Pay close attention to how you feel after the procedure. If you start to have pain, write down when it hurts and how it feels. This will help you and your health care provider to know if you need an additional treatment.  Check your needle insertion site every day for signs of infection. Watch for:  Redness, swelling,  or pain.  Fluid, blood, or pus.  Keep all follow-up visits as told by your health care provider. This is important. Contact a health care provider if:  Your pain does not get better.  You have redness, swelling, or pain at the needle insertion site.  You have fluid, blood, or pus coming from the needle insertion site.  You have a fever. Get help right away if:  You develop sudden, severe pain.  You develop numbness or tingling near the procedure site that does not go away. This information is not intended to replace advice given to you by your health care provider. Make sure you discuss any questions you have with your health care provider. Document Released: 07/11/2011 Document Revised: 04/18/2016 Document Reviewed: 12/19/2014 Elsevier Interactive Patient Education  2017 [place]. Radiofrequency Lesioning Radiofrequency lesioning is a procedure that is performed to relieve pain. The procedure is often used for back, neck, or arm pain. Radiofrequency lesioning involves the use of a machine that creates radio waves to make heat. During the procedure, the heat is applied to the nerve that carries the pain signal. The heat damages the nerve and interferes with the pain signal. Pain relief usually starts about 2 weeks after the procedure and lasts for 6 months to 1 year. Tell a health care provider about:  Any allergies you have.  All medicines you are taking, including vitamins, herbs, eye drops, creams, and over-the-counter medicines.  Any problems you or family members have had with anesthetic medicines.  Any blood disorders you have.  Any surgeries you have had.  Any medical conditions you have.  Whether you are pregnant or may be pregnant. What are the risks? Generally, this is a safe procedure. However, problems may occur, including:  Pain or soreness at the injection site.  Infection at the injection site.  Damage to nerves or blood vessels. What happens before  the procedure?  Ask your health care provider about:  Changing or stopping your regular medicines. This is especially important if you are taking diabetes medicines or blood thinners.  Taking medicines such as aspirin and ibuprofen. These medicines can thin your blood. Do not take these medicines before your procedure if your health care provider instructs you not to.  Follow instructions from your health care provider about eating or drinking restrictions.  Plan to have someone take you home after the procedure.  If you go home right after the procedure, plan to have someone with you for 24 hours. What happens during the procedure?  You will be given one or more of the following:  A medicine to help you relax (sedative).  A medicine to numb the area (local anesthetic).  You will be awake during the procedure. You will need to be able to talk with the health care provider during the procedure.  With the help of a type of X-ray (fluoroscopy), the health care provider will insert a radiofrequency needle into the area to be treated.  Next, a wire that carries the radio waves (electrode) will be put through the radiofrequency needle. An electrical pulse will be sent through the electrode to verify the correct nerve. You will feel a tingling sensation, and you may have muscle twitching.  Then, the tissue that is around the needle tip will be heated by an electric current that is passed using the radiofrequency machine. This will numb the nerves.  A bandage (dressing) will be put on the insertion area after the procedure is done. The procedure may vary among health care providers and hospitals. What happens after the procedure?  Your blood pressure, heart rate, breathing rate, and blood oxygen level will be monitored often until the medicines you were given have worn off.  Return to your normal activities as directed by your health care provider. This information is not intended to  replace advice given to you by your health care provider. Make sure you discuss any questions you have with your health care provider. Document Released: 07/10/2011 Document Revised: 04/18/2016 Document Reviewed: 12/19/2014 Elsevier Interactive Patient Education  2017 [place]. Radiofrequency Lesioning Radiofrequency lesioning is a procedure that is performed to relieve pain. The procedure is often used for back, neck, or arm pain. Radiofrequency lesioning involves the use of a machine that creates radio waves to make heat. During the procedure, the heat is applied to the nerve that carries the pain signal. The heat damages the nerve and interferes with the pain signal. Pain relief usually starts about 2 weeks after the procedure and lasts for 6 months to 1 year. Tell a health care provider about:  Any allergies you have.  All medicines you are taking, including vitamins, herbs, eye drops, creams, and over-the-counter medicines.  Any problems you or family members have had with anesthetic medicines.  Any blood disorders you have.  Any surgeries  you have had.  Any medical conditions you have.  Whether you are pregnant or may be pregnant. What are the risks? Generally, this is a safe procedure. However, problems may occur, including:  Pain or soreness at the injection site.  Infection at the injection site.  Damage to nerves or blood vessels. What happens before the procedure?  Ask your health care provider about:  Changing or stopping your regular medicines. This is especially important if you are taking diabetes medicines or blood thinners.  Taking medicines such as aspirin and ibuprofen. These medicines can thin your blood. Do not take these medicines before your procedure if your health care provider instructs you not to.  Follow instructions from your health care provider about eating or drinking restrictions.  Plan to have someone take you home after the  procedure.  If you go home right after the procedure, plan to have someone with you for 24 hours. What happens during the procedure?  You will be given one or more of the following:  A medicine to help you relax (sedative).  A medicine to numb the area (local anesthetic).  You will be awake during the procedure. You will need to be able to talk with the health care provider during the procedure.  With the help of a type of X-ray (fluoroscopy), the health care provider will insert a radiofrequency needle into the area to be treated.  Next, a wire that carries the radio waves (electrode) will be put through the radiofrequency needle. An electrical pulse will be sent through the electrode to verify the correct nerve. You will feel a tingling sensation, and you may have muscle twitching.  Then, the tissue that is around the needle tip will be heated by an electric current that is passed using the radiofrequency machine. This will numb the nerves.  A bandage (dressing) will be put on the insertion area after the procedure is done. The procedure may vary among health care providers and hospitals. What happens after the procedure?  Your blood pressure, heart rate, breathing rate, and blood oxygen level will be monitored often until the medicines you were given have worn off.  Return to your normal activities as directed by your health care provider. This information is not intended to replace advice given to you by your health care provider. Make sure you discuss any questions you have with your health care provider. Document Released: 07/10/2011 Document Revised: 04/18/2016 Document Reviewed: 12/19/2014 Elsevier Interactive Patient Education  2017 [employer].

## 2017-03-06 NOTE — Patient Outreach (Signed)
[facility]) Care Management  03/06/2017  Cheryl [name] Dec 10, 1950 [id number]   Telephone follow up call  Spoke with patient discussed her recent office   appointment with Dr.Khan cardiologist, and stress test that was scheduled for yesterday. Patient reports she had diarrhea on yesterday and it was recommended that she reschedule test, discussed she has echo rescheduled for tomorrow.  Patient reports she had diarrhea beginning on yesterday, complaint of diarrhea episode this morning and she has taken her lomotil. Patient reports she has this every once in a while.  Reinforced with patient the importance of staying hydrated, drinking liquids as tolerated to prevent dehydration. Patient denies nausea vomiting, abdominal pain  and has been able to drink liquids as she normally does. Discussed with patient benefits of yogurt she does not like it , suggested foods such crackers, baked potatoes, applesauce .   Reinforced with patient the importance of  Notifying  MD if diarrhea continues.   Patient discussed her breathing is doing about the same, she is tolerating activity around her home, and wears oxygen during the night.   Patient appointment at pain clinic and her daughter will provide transportation.  Plan Will follow up patient in the next week by telephone . Patient will notify MD if diarrhea does not improve or symptoms worsen.    Egbert Garibaldi, RN, [facility] Care Management [phone]- Mobile [phone]- Toll Free Main Office

## 2017-03-06 NOTE — Progress Notes (Signed)
Nursing Pain Medication Assessment:  Safety precautions to be maintained throughout the outpatient stay will include: orient to surroundings, keep bed in low position, maintain call bell within reach at all times, provide assistance with transfer out of bed and ambulation.  Medication Inspection Compliance: Pill count conducted under aseptic conditions, in front of the patient. Neither the pills nor the bottle was removed from the patient's sight at any time. Once count was completed pills were immediately returned to the patient in their original bottle.  Medication #1: Tramadol (Ultram) Pill/Patch Count: 154 of 240 pills remain Pill/Patch Appearance: Markings consistent with prescribed medication Bottle Appearance: Standard pharmacy container. Clearly labeled. Filled Date:03 / 30 / 2018 Last Medication intake:  Today  Medication #2: Fentanyl patch Pill/Patch Count: 10 of 10 pills remain Pill/Patch Appearance: Markings consistent with prescribed medication Bottle Appearance: Standard pharmacy container. Clearly labeled. Filled Date: 04 / 11 / 2018 Last Medication intake:  Day before yesterday

## 2017-03-06 NOTE — Patient Instructions (Addendum)
Preparing for Procedure with Sedation Instructions: . Oral Intake: Do not eat or drink anything for at least 8 hours prior to your procedure. . Transportation: Public transportation is not allowed. Bring an adult driver. The driver must be physically present in our waiting room before any procedure can be started. Marland Kitchen Physical Assistance: Bring an adult physically capable of assisting you, in the event you need help. This adult should keep you company at home for at least 6 hours after the procedure. . Blood Pressure Medicine: Take your blood pressure medicine with a sip of water the morning of the procedure. . Blood thinners:  . Diabetics on insulin: Notify the staff so that you can be scheduled 1st case in the morning. If your diabetes requires high dose insulin, take only  of your normal insulin dose the morning of the procedure and notify the staff that you have done so. . Preventing infections: Shower with an antibacterial soap the morning of your procedure. . Build-up your immune system: Take 1000 mg of Vitamin C with every meal (3 times a day) the day prior to your procedure. Marland Kitchen Antibiotics: Inform the staff if you have a condition or reason that requires you to take antibiotics before dental procedures. . Pregnancy: If you are pregnant, call and cancel the procedure. . Sickness: If you have a cold, fever, or any active infections, call and cancel the procedure. . Arrival: You must be in the facility at least 30 minutes prior to your scheduled procedure. . Children: Do not bring children with you. . Dress appropriately: Bring dark clothing that you would not mind if they get stained. . Valuables: Do not bring any jewelry or valuables. Procedure appointments are reserved for interventional treatments only. Marland Kitchen No Prescription Refills. . No medication changes will be discussed during procedure appointments. . No disability issues will be  discussed.  ____________________________________________________________________________________________  Radiofrequency Lesioning, Care After Refer to this sheet in the next few weeks. These instructions provide you with information about caring for yourself after your procedure. Your health care provider may also give you more specific instructions. Your treatment has been planned according to current medical practices, but problems sometimes occur. Call your health care provider if you have any problems or questions after your procedure. What can I expect after the procedure? After the procedure, it is common to have:  Pain from the burned nerve.  Temporary numbness. Follow these instructions at home:  Take over-the-counter and prescription medicines only as told by your health care provider.  Return to your normal activities as told by your health care provider. Ask your health care provider what activities are safe for you.  Pay close attention to how you feel after the procedure. If you start to have pain, write down when it hurts and how it feels. This will help you and your health care provider to know if you need an additional treatment.  Check your needle insertion site every day for signs of infection. Watch for:  Redness, swelling, or pain.  Fluid, blood, or pus.  Keep all follow-up visits as told by your health care provider. This is important. Contact a health care provider if:  Your pain does not get better.  You have redness, swelling, or pain at the needle insertion site.  You have fluid, blood, or pus coming from the needle insertion site.  You have a fever. Get help right away if:  You develop sudden, severe pain.  You develop numbness or tingling near the procedure  site that does not go away. This information is not intended to replace advice given to you by your health care provider. Make sure you discuss any questions you have with your health care  provider. Document Released: 07/11/2011 Document Revised: 04/18/2016 Document Reviewed: 12/19/2014 Elsevier Interactive Patient Education  2017 [employer] Radiofrequency Lesioning Radiofrequency lesioning is a procedure that is performed to relieve pain. The procedure is often used for back, neck, or arm pain. Radiofrequency lesioning involves the use of a machine that creates radio waves to make heat. During the procedure, the heat is applied to the nerve that carries the pain signal. The heat damages the nerve and interferes with the pain signal. Pain relief usually starts about 2 weeks after the procedure and lasts for 6 months to 1 year. Tell a health care provider about:  Any allergies you have.  All medicines you are taking, including vitamins, herbs, eye drops, creams, and over-the-counter medicines.  Any problems you or family members have had with anesthetic medicines.  Any blood disorders you have.  Any surgeries you have had.  Any medical conditions you have.  Whether you are pregnant or may be pregnant. What are the risks? Generally, this is a safe procedure. However, problems may occur, including:  Pain or soreness at the injection site.  Infection at the injection site.  Damage to nerves or blood vessels. What happens before the procedure?  Ask your health care provider about:  Changing or stopping your regular medicines. This is especially important if you are taking diabetes medicines or blood thinners.  Taking medicines such as aspirin and ibuprofen. These medicines can thin your blood. Do not take these medicines before your procedure if your health care provider instructs you not to.  Follow instructions from your health care provider about eating or drinking restrictions.  Plan to have someone take you home after the procedure.  If you go home right after the procedure, plan to have someone with you for 24 hours. What happens during the  procedure?  You will be given one or more of the following:  A medicine to help you relax (sedative).  A medicine to numb the area (local anesthetic).  You will be awake during the procedure. You will need to be able to talk with the health care provider during the procedure.  With the help of a type of X-ray (fluoroscopy), the health care provider will insert a radiofrequency needle into the area to be treated.  Next, a wire that carries the radio waves (electrode) will be put through the radiofrequency needle. An electrical pulse will be sent through the electrode to verify the correct nerve. You will feel a tingling sensation, and you may have muscle twitching.  Then, the tissue that is around the needle tip will be heated by an electric current that is passed using the radiofrequency machine. This will numb the nerves.  A bandage (dressing) will be put on the insertion area after the procedure is done. The procedure may vary among health care providers and hospitals. What happens after the procedure?  Your blood pressure, heart rate, breathing rate, and blood oxygen level will be monitored often until the medicines you were given have worn off.  Return to your normal activities as directed by your health care provider. This information is not intended to replace advice given to you by your health care provider. Make sure you discuss any questions you have with your health care provider. Document Released: 07/10/2011 Document Revised:  04/18/2016 Document Reviewed: 12/19/2014 Elsevier Interactive Patient Education  2017 [employer] Radiofrequency Lesioning Radiofrequency lesioning is a procedure that is performed to relieve pain. The procedure is often used for back, neck, or arm pain. Radiofrequency lesioning involves the use of a machine that creates radio waves to make heat. During the procedure, the heat is applied to the nerve that carries the pain signal. The heat damages the  nerve and interferes with the pain signal. Pain relief usually starts about 2 weeks after the procedure and lasts for 6 months to 1 year. Tell a health care provider about:  Any allergies you have.  All medicines you are taking, including vitamins, herbs, eye drops, creams, and over-the-counter medicines.  Any problems you or family members have had with anesthetic medicines.  Any blood disorders you have.  Any surgeries you have had.  Any medical conditions you have.  Whether you are pregnant or may be pregnant. What are the risks? Generally, this is a safe procedure. However, problems may occur, including:  Pain or soreness at the injection site.  Infection at the injection site.  Damage to nerves or blood vessels. What happens before the procedure?  Ask your health care provider about:  Changing or stopping your regular medicines. This is especially important if you are taking diabetes medicines or blood thinners.  Taking medicines such as aspirin and ibuprofen. These medicines can thin your blood. Do not take these medicines before your procedure if your health care provider instructs you not to.  Follow instructions from your health care provider about eating or drinking restrictions.  Plan to have someone take you home after the procedure.  If you go home right after the procedure, plan to have someone with you for 24 hours. What happens during the procedure?  You will be given one or more of the following:  A medicine to help you relax (sedative).  A medicine to numb the area (local anesthetic).  You will be awake during the procedure. You will need to be able to talk with the health care provider during the procedure.  With the help of a type of X-ray (fluoroscopy), the health care provider will insert a radiofrequency needle into the area to be treated.  Next, a wire that carries the radio waves (electrode) will be put through the radiofrequency needle. An  electrical pulse will be sent through the electrode to verify the correct nerve. You will feel a tingling sensation, and you may have muscle twitching.  Then, the tissue that is around the needle tip will be heated by an electric current that is passed using the radiofrequency machine. This will numb the nerves.  A bandage (dressing) will be put on the insertion area after the procedure is done. The procedure may vary among health care providers and hospitals. What happens after the procedure?  Your blood pressure, heart rate, breathing rate, and blood oxygen level will be monitored often until the medicines you were given have worn off.  Return to your normal activities as directed by your health care provider. This information is not intended to replace advice given to you by your health care provider. Make sure you discuss any questions you have with your health care provider. Document Released: 07/10/2011 Document Revised: 04/18/2016 Document Reviewed: 12/19/2014 Elsevier Interactive Patient Education  2017 [employer].

## 2017-03-07 DIAGNOSIS — L03116 Cellulitis of left lower limb: Secondary | ICD-10-CM | POA: Diagnosis not present

## 2017-03-07 DIAGNOSIS — I10 Essential (primary) hypertension: Secondary | ICD-10-CM | POA: Diagnosis not present

## 2017-03-07 DIAGNOSIS — R0602 Shortness of breath: Secondary | ICD-10-CM | POA: Diagnosis not present

## 2017-03-07 DIAGNOSIS — I2510 Atherosclerotic heart disease of native coronary artery without angina pectoris: Secondary | ICD-10-CM | POA: Diagnosis not present

## 2017-03-10 ENCOUNTER — Other Ambulatory Visit: Payer: Self-pay | Admitting: *Deleted

## 2017-03-10 NOTE — Patient Outreach (Signed)
[facility]) Care Management  03/10/2017  Cheryl [name] 05-20-1951 [id number]  Telephone follow up call  Placed call to patient reports she is doing better, reports she still has diarrhea but it has improved, Patient reports taking lomotil at least twice daily after diarrhea stools. Patient again states that this diarrhea episodes is not unusual for her.  Discussed with patient notifying MD she states no, it is better.Patient discussed she is still eating about the same, and drinking well, she denies abdominal pain or nausea.  Patient reports her breathing is about the same, no increase in shortness of breath, cough or sputum production . Patient reports she still does not require wearing her oxygen at night.   Patient discussed she was able to have her 2 d echo on last week and is waiting on rescheduling stress test.   Patient discussed home health RN is still visiting at least once a week. Patient reports she continues to have falls at home, recent fall over the weekend reports she has a scraped area on her arm that she has placed a bandage on , no other injury.  Patient agreeable to home visit in the next 2 weeks.  Plan Will schedule home visit in the next 2 weeks, will discuss continued community care needs and follow up as 60 day transition of care nearing completion. Patient request return call regarding setting up visit to look at her calendar more.  Patient to notify MD for worsening of diarrhea or signs of dehydration.     Egbert Garibaldi, RN, [facility] Care Management [phone]- Mobile [phone]- Toll Free Main Office

## 2017-03-11 NOTE — Progress Notes (Signed)
[facility] Pulmonary Medicine Consultation      Assessment and Plan:  COPD.  -Severe emphysema/COPD, likely related to cigarette smoking. -She has had admissions to the hospital with pneumonia. We will administer Prevnar 13 today. -Dyspnea on mild to moderate exertion, we discussed pulmonary rehabilitation, which she does not want to consider at this time. We discussed the importance of trying to increase her activity level, I recommended a regimen of 20 or 30 minutes of walking per day  Restrictive lung disease.  -She has severe kyphosis, she is likely contributing to her dyspnea with reduced lung volumes.  Dyspnea. -Discussed the etiologies of her dyspnea, this is likely COPD, severe restriction from her spinal curvature, active smoking. Discussed that the only modifiable factor in this is her smoking at this time, thus quitting smoking as a pressing she can do for her respiratory health at this time.  Nicotine abuse.  -Currently smoking approximately one pack of cigarettes per day. -Spent greater than 3 minutes in smoking cessation discussion, she is not really considering quitting at this time.  Date: 03/11/2017  MRN# [id number] Cheryl [name] 22-Mar-1951    Cheryl [name] is a 66 y.o. old female seen in consultation for chief complaint of:    Chief Complaint  Patient presents with  . Advice Only    per Dr. Sherrie Mustache. pt states she was dx with COPD around 2006. pt reports of sob with exertion &  prod cough with yellow mucus x75m    HPI:   Patient was was seen and admitted into [facility] on 12/23/2016 for Recurrent pneumonia. She was discharged from the hospital on 12/25/2016, and was sent to [facility] nursing home. Patient was seen back in the ER or 01/05/2017 for fever and chills. Patient declined admission and was treated with Levaquin and discharged to [facility] nursing home. Patient was recently released from the nursing home  Her husband Homero Fellers is present and gives some of  the history. He notes that she gets winded with mild activity. She can vacuum without much difficulty, she can walk around a [employer] with a walker, which she has been using a cane.  She has been diagnosed with aspiration pneumonia, but she apparently had swallowing test and that was ok. She notes no problems with choking or coughing with eating.  She is taking spiriva once daily, and advair twice daily, and rinses her mouth after using it.  She has a rescue inhaler which she has not used in several weeks.  She uses 2L at home when needed, she has a portable tank, which she takes with her, but rarely uses.  She smokes half ppd, she is not thinking of quitting, And does not want to consider it. 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    Pt images personally reviewed; CXR 01/28/17; Severe hyperinflation consistent with emphysema; severe kyphosis.  Desat walk 03/13/17; beginning sat was 97% and HR 76 at rest on RA. After 150 feet her sat was 96% and HR 76. After 300 feet sat was 99% and HR 89. She had to stop due to dyspnea and leg fatigue, however she had walked with her walker at a fairly brisk pace.    PMHX:   Past Medical History:  Diagnosis Date  . Allergy   . Anxiety   . Arthritis   . Aspiration pneumonitis (HCC) 11/24/2015  . Asthma   . Chronic pain   . COPD (chronic obstructive pulmonary disease) (HCC)   .  Coronary artery disease   . DVT (deep venous thrombosis) (HCC)   . GERD (gastroesophageal reflux disease)   . Headache   . Hyperlipidemia   . Hypertension   . Migraines   . Neuropathy 2010  . Osteoporosis   . Oxygen deficiency   . Peripheral vascular disease (HCC)   . Pneumonia   . Pneumonia 11/19/2015  . Pneumonia 10/2016  . Vitamin D deficiency    Surgical Hx:  Past Surgical History:  Procedure Laterality Date  . ABDOMINAL HYSTERECTOMY  1975   Bilaterl Oophorectomy; Dur to IUD infection  . abdomnal aortic stent  05/30/2008    Dr. Nanetta Batty  . APPENDECTOMY    . APPENDECTOMY    . cardiac catherization  10/31/2009  . CERVICAL FUSION  C5 - 6/C6-7  . CHOLECYSTECTOMY  1972  . COLONOSCOPY WITH PROPOFOL N/A 07/27/2015   Procedure: COLONOSCOPY WITH PROPOFOL;  Surgeon: Wallace Cullens, MD;  Location: [facility] ENDOSCOPY;  Service: Gastroenterology;  Laterality: N/A;  . ESOPHAGOGASTRODUODENOSCOPY (EGD) WITH PROPOFOL N/A 07/27/2015   Procedure: ESOPHAGOGASTRODUODENOSCOPY (EGD) WITH PROPOFOL;  Surgeon: Wallace Cullens, MD;  [facility] END[facility]COPY;  Service: Gastroenterology;  Laterality: N/A;  . FOOT SURGERY Bilateral    5-6 years per patient  . SPINE SURGERY     Family Hx:  Family History  Problem Relation Age of Onset  . Cancer Mother   . Arthritis Mother   . Heart disease Mother   . Diabetes Mother     mellitus, type 2  . Heart disease Father   . Diabetes Sister   . Cancer Brother   . Cancer Brother     lung  . Diabetes Brother    Social Hx:   Social History  Substance Use Topics  . Smoking status: Current Every Day Smoker    Packs/day: 1.00    Years: 50.00    Types: Cigarettes  . Smokeless tobacco: Never Used     Comment: Previously smoked 2 ppd  . Alcohol use No   Medication:   Reviewed   Allergies:  Percocet [oxycodone-acetaminophen]; Aspirin; Codeine; Propoxyphene; and Sulfa antibiotics  Review of Systems: Gen:  Denies  fever, sweats, chills HEENT: Denies blurred vision, double vision. bleeds, sore throat Cvc:  No dizziness, chest pain. Resp:   Denies cough or sputum production, shortness of breath Gi: Denies swallowing difficulty, stomach pain. Gu:  Denies bladder incontinence, burning urine Ext:   No Joint pain, stiffness. Skin: No skin rash,  hives  Endoc:  No polyuria, polydipsia. Psych: No depression, insomnia. Other:  All other systems were reviewed with the patient and were negative other that what is mentioned in the HPI.   Physical Examination:   VS: BP 134/64 (BP Location: Left Arm,  Cuff Size: Normal)   Pulse 89   Ht  (1.473 m)   Wt 94 lb 9.6 oz (42.9 kg)   SpO2 99%   BMI 19.77 kg/m   General Appearance: No distress , severe spinal curvature/deformity. Neuro:without focal findings,  speech normal,  HEENT: PERRLA, EOM intact.   Pulmonary: normal breath sounds, decreased air entry bilaterally. CardiovascularNormal S1,S2.  No m/r/g.   Abdomen: Benign, Soft, non-tender. Renal:  No costovertebral tenderness  GU:  No performed at this time. Endoc: No evident thyromegaly, no signs of acromegaly. Skin:   warm, no rashes, no ecchymosis  Extremities: normal, no cyanosis, clubbing.  Other findings:    LABORATORY PANEL:   CBC No results for input(s): WBC, HGB, HCT, PLT in the  last 168 hours. ------------------------------------------------------------------------------------------------------------------  Chemistries  No results for input(s): NA, K, CL, CO2, GLUCOSE, BUN, CREATININE, CALCIUM, MG, AST, ALT, ALKPHOS, BILITOT in the last 168 hours.  Invalid input(s): GFRCGP ------------------------------------------------------------------------------------------------------------------  Cardiac Enzymes No results for input(s): TROPONINI in the last 168 hours. ------------------------------------------------------------  RADIOLOGY:  No results found.     Thank  you for the consultation and for allowing [facility], Critical Care to assist in the care of your patient. Our recommendations are noted above.  Please contact us if we can be of further service.   Wells Guiles, MD.  Board Certified in Internal Medicine, Pulmonary Medicine, Critical Care Medicine, and Sleep Medicine.  [facility] Office Number: [phone]  Santiago Glad, M.D.  Billy Fischer, M.D  03/11/2017

## 2017-03-13 ENCOUNTER — Ambulatory Visit (INDEPENDENT_AMBULATORY_CARE_PROVIDER_SITE_OTHER): Payer: Medicare HMO | Admitting: Internal Medicine

## 2017-03-13 ENCOUNTER — Encounter: Payer: Self-pay | Admitting: Internal Medicine

## 2017-03-13 VITALS — BP 134/64 | HR 89 | Ht <= 58 in | Wt 94.6 lb

## 2017-03-13 DIAGNOSIS — J984 Other disorders of lung: Secondary | ICD-10-CM | POA: Diagnosis not present

## 2017-03-13 DIAGNOSIS — F17219 Nicotine dependence, cigarettes, with unspecified nicotine-induced disorders: Secondary | ICD-10-CM

## 2017-03-13 DIAGNOSIS — J438 Other emphysema: Secondary | ICD-10-CM | POA: Diagnosis not present

## 2017-03-13 DIAGNOSIS — Z23 Encounter for immunization: Secondary | ICD-10-CM | POA: Diagnosis not present

## 2017-03-13 DIAGNOSIS — F17218 Nicotine dependence, cigarettes, with other nicotine-induced disorders: Secondary | ICD-10-CM | POA: Diagnosis not present

## 2017-03-13 MED ORDER — PNEUMOCOCCAL 13-VAL CONJ VACC IM SUSP
0.5000 mL | Freq: Once | INTRAMUSCULAR | Status: AC
Start: 2017-03-13 — End: 2017-03-13
  Administered 2017-03-13: 0.5 mL via INTRAMUSCULAR

## 2017-03-13 MED ORDER — PNEUMOCOCCAL 13-VAL CONJ VACC IM SUSP: 0.5000 mL | INTRAMUSCULAR | Status: DC

## 2017-03-13 NOTE — Patient Instructions (Addendum)
--  Continue advair and spiriva.   --Increase your activity level, try to walk 20 to 30 min per day and a slow sustained pace.   ----Quitting smoking is the most important thing that you can do for your health.  --Quitting smoking will have greater affect on your health than any medicine that we can give you.   --Prevar 13 today if not already administered.

## 2017-03-13 NOTE — Addendum Note (Signed)
Addended by: Maxwell Marion A on: 03/13/2017 12:02 PM   Modules accepted: Orders

## 2017-03-14 DIAGNOSIS — I509 Heart failure, unspecified: Secondary | ICD-10-CM | POA: Diagnosis not present

## 2017-03-14 DIAGNOSIS — J181 Lobar pneumonia, unspecified organism: Secondary | ICD-10-CM | POA: Diagnosis not present

## 2017-03-14 DIAGNOSIS — M810 Age-related osteoporosis without current pathological fracture: Secondary | ICD-10-CM | POA: Diagnosis not present

## 2017-03-14 DIAGNOSIS — I739 Peripheral vascular disease, unspecified: Secondary | ICD-10-CM | POA: Diagnosis not present

## 2017-03-14 DIAGNOSIS — E43 Unspecified severe protein-calorie malnutrition: Secondary | ICD-10-CM | POA: Diagnosis not present

## 2017-03-14 DIAGNOSIS — G629 Polyneuropathy, unspecified: Secondary | ICD-10-CM | POA: Diagnosis not present

## 2017-03-14 DIAGNOSIS — J449 Chronic obstructive pulmonary disease, unspecified: Secondary | ICD-10-CM | POA: Diagnosis not present

## 2017-03-14 DIAGNOSIS — I110 Hypertensive heart disease with heart failure: Secondary | ICD-10-CM | POA: Diagnosis not present

## 2017-03-14 DIAGNOSIS — M47816 Spondylosis without myelopathy or radiculopathy, lumbar region: Secondary | ICD-10-CM | POA: Diagnosis not present

## 2017-03-14 DIAGNOSIS — J440 Chronic obstructive pulmonary disease with acute lower respiratory infection: Secondary | ICD-10-CM | POA: Diagnosis not present

## 2017-03-16 DIAGNOSIS — J449 Chronic obstructive pulmonary disease, unspecified: Secondary | ICD-10-CM | POA: Diagnosis not present

## 2017-03-17 ENCOUNTER — Ambulatory Visit: Payer: Self-pay | Admitting: *Deleted

## 2017-03-18 ENCOUNTER — Other Ambulatory Visit: Payer: Self-pay | Admitting: *Deleted

## 2017-03-18 NOTE — Patient Outreach (Signed)
[facility]) Care Management  03/18/2017  Mauria Asquith 10/08/1951 [id number]  Telephone follow up   Spoke with patient reports she is doing fine. Patient discussed her recent referral appointment to Pulmonary patient states he said I was doing good and does not have to come back for 9 months.  Patient discussed she will call to have stress later on with Dr.Kahn, she had to reschedule due to her diarrhea a couple weeks ago, she want to make sure it is all clear .  Patient states she will call them, declined assistance with rescheduling.    Patient discussed her diarrhea is better, she discussed her problems with eating some foods due to getting used to her new dentures.  Patient discussed what she had eaten today strawberries, cheese, ice cream and popcorn. Discussed more nutritious soft foods but patient voiced dislike, for most  food mentioned  to yogurt , cottage cheese , ensure, boost, baked potato .  Patient reports her recent weight Is 92 lbs, 4 pounds less in a month.   COPD/Recent Pneumonia  Patient discussed she is able to complete her usual activities around the house, cleaning bathroom rooms, vacuuming. Patient states her sister usuallly stays with her during the day while her husband is at work. Patient talking in complete sentences, reports not requiring her oxygen , but takes it when leaving the house. Patient reports taking all of her medications as prescribed, has not used rescue inhaler, and reports she is in her usual state of breathing on today and is able refer to zone she is in and states she looks at magnet on refrigerator daily, and is aware to call doctor if she gets worse.  Patient continues to smoke daily outside on her deck and not ready to quit when discussed today, Patient not interested in   attending pulmonary rehab at this time.   High Fall  Risk  Patient uses a walker at home, reports she continues to experience falls at home,"I have been  falling for years the doctor knows that" , last fall 2  week ago reports no injury and able to get herself from the floor. Patient wears medic alert button, reviewed importance of notifying MD of continued falls and  with injury   Patient report [facility] RN nurse final visit will on next week, they have been seeing her every other week.  Patient has completed 60 days of transition of care, no readmissions, patient denies any new concerns and declines need for community nurse home visit. Discussed telephone  health coach program for continued management of COPD patient is in agreement with this service. Reminded patient if new concerns arise , she will make contact with [place] sooner.   Plan Will close to community case management , will send discipline closure letter to MD Patient will notify MD of new or worsening of respiratory symptoms and 911 for red zone symptoms. Egbert Garibaldi, RN, [facility] Care Management [phone]- Mobile [phone]- Toll Free Main Office

## 2017-03-19 ENCOUNTER — Encounter: Payer: Self-pay | Admitting: *Deleted

## 2017-03-20 ENCOUNTER — Encounter: Payer: Self-pay | Admitting: *Deleted

## 2017-03-22 ENCOUNTER — Other Ambulatory Visit: Payer: Self-pay | Admitting: Pain Medicine

## 2017-03-24 DIAGNOSIS — G629 Polyneuropathy, unspecified: Secondary | ICD-10-CM | POA: Diagnosis not present

## 2017-03-24 DIAGNOSIS — J440 Chronic obstructive pulmonary disease with acute lower respiratory infection: Secondary | ICD-10-CM | POA: Diagnosis not present

## 2017-03-24 DIAGNOSIS — M47816 Spondylosis without myelopathy or radiculopathy, lumbar region: Secondary | ICD-10-CM | POA: Diagnosis not present

## 2017-03-24 DIAGNOSIS — E43 Unspecified severe protein-calorie malnutrition: Secondary | ICD-10-CM | POA: Diagnosis not present

## 2017-03-24 DIAGNOSIS — I509 Heart failure, unspecified: Secondary | ICD-10-CM | POA: Diagnosis not present

## 2017-03-24 DIAGNOSIS — I739 Peripheral vascular disease, unspecified: Secondary | ICD-10-CM | POA: Diagnosis not present

## 2017-03-24 DIAGNOSIS — M810 Age-related osteoporosis without current pathological fracture: Secondary | ICD-10-CM | POA: Diagnosis not present

## 2017-03-24 DIAGNOSIS — J181 Lobar pneumonia, unspecified organism: Secondary | ICD-10-CM | POA: Diagnosis not present

## 2017-03-24 DIAGNOSIS — I110 Hypertensive heart disease with heart failure: Secondary | ICD-10-CM | POA: Diagnosis not present

## 2017-04-10 DIAGNOSIS — R296 Repeated falls: Secondary | ICD-10-CM | POA: Diagnosis not present

## 2017-04-10 DIAGNOSIS — G609 Hereditary and idiopathic neuropathy, unspecified: Secondary | ICD-10-CM | POA: Diagnosis not present

## 2017-04-10 DIAGNOSIS — G3184 Mild cognitive impairment, so stated: Secondary | ICD-10-CM | POA: Diagnosis not present

## 2017-04-10 DIAGNOSIS — G43019 Migraine without aura, intractable, without status migrainosus: Secondary | ICD-10-CM | POA: Diagnosis not present

## 2017-04-13 DIAGNOSIS — J449 Chronic obstructive pulmonary disease, unspecified: Secondary | ICD-10-CM | POA: Diagnosis not present

## 2017-04-14 ENCOUNTER — Encounter: Payer: Self-pay | Admitting: Pain Medicine

## 2017-04-14 ENCOUNTER — Ambulatory Visit: Payer: Medicare HMO | Admitting: Pain Medicine

## 2017-04-14 ENCOUNTER — Ambulatory Visit
Admission: RE | Admit: 2017-04-14 | Discharge: 2017-04-14 | Disposition: A | Discharge status: Home / Self Care | Payer: Medicare HMO | Source: Ambulatory Visit | Attending: Pain Medicine | Admitting: Pain Medicine

## 2017-04-14 ENCOUNTER — Ambulatory Visit (HOSPITAL_BASED_OUTPATIENT_CLINIC_OR_DEPARTMENT_OTHER): Payer: Medicare HMO | Admitting: Pain Medicine

## 2017-04-14 VITALS — BP 143/80 | HR 72 | Temp 97.4°F | Resp 16 | Ht <= 58 in | Wt 93.0 lb

## 2017-04-14 DIAGNOSIS — G8929 Other chronic pain: Secondary | ICD-10-CM

## 2017-04-14 DIAGNOSIS — Z886 Allergy status to analgesic agent status: Secondary | ICD-10-CM | POA: Diagnosis not present

## 2017-04-14 DIAGNOSIS — M47816 Spondylosis without myelopathy or radiculopathy, lumbar region: Secondary | ICD-10-CM

## 2017-04-14 DIAGNOSIS — G8918 Other acute postprocedural pain: Secondary | ICD-10-CM

## 2017-04-14 DIAGNOSIS — Z885 Allergy status to narcotic agent status: Secondary | ICD-10-CM | POA: Insufficient documentation

## 2017-04-14 DIAGNOSIS — M5441 Lumbago with sciatica, right side: Secondary | ICD-10-CM

## 2017-04-14 DIAGNOSIS — Z882 Allergy status to sulfonamides status: Secondary | ICD-10-CM | POA: Diagnosis not present

## 2017-04-14 DIAGNOSIS — M488X6 Other specified spondylopathies, lumbar region: Secondary | ICD-10-CM | POA: Insufficient documentation

## 2017-04-14 DIAGNOSIS — M4696 Unspecified inflammatory spondylopathy, lumbar region: Secondary | ICD-10-CM

## 2017-04-14 DIAGNOSIS — M5442 Lumbago with sciatica, left side: Secondary | ICD-10-CM | POA: Insufficient documentation

## 2017-04-14 DIAGNOSIS — M545 Low back pain: Secondary | ICD-10-CM | POA: Diagnosis present

## 2017-04-14 HISTORY — DX: Other acute postprocedural pain: G89.18

## 2017-04-14 MED ORDER — ROPIVACAINE HCL 2 MG/ML IJ SOLN
9.0000 mL | Freq: Once | INTRAMUSCULAR | Status: AC
  Administered 2017-04-14: 10 mL via PERINEURAL
  Filled 2017-04-14: qty 10

## 2017-04-14 MED ORDER — LACTATED RINGERS IV SOLN
1000.0000 mL | Freq: Once | INTRAVENOUS | Status: AC
  Administered 2017-04-14: 1000 mL via INTRAVENOUS

## 2017-04-14 MED ORDER — LIDOCAINE HCL (PF) 1 % IJ SOLN: 10.0000 mL | Freq: Once | INTRAMUSCULAR | Status: DC

## 2017-04-14 MED ORDER — FENTANYL CITRATE (PF) 100 MCG/2ML IJ SOLN
25.0000 ug | INTRAMUSCULAR | Status: DC | PRN
Start: 2017-04-14 — End: 2017-04-14
  Administered 2017-04-14: 50 ug via INTRAVENOUS
  Filled 2017-04-14: qty 2

## 2017-04-14 MED ORDER — CYCLOBENZAPRINE HCL 5 MG PO TABS: 5.0000 mg | ORAL_TABLET | Freq: Three times a day (TID) | ORAL | 0 refills | Status: DC | PRN

## 2017-04-14 MED ORDER — TRIAMCINOLONE ACETONIDE 40 MG/ML IJ SUSP
INTRAMUSCULAR | Status: AC
  Filled 2017-04-14: qty 1

## 2017-04-14 MED ORDER — MIDAZOLAM HCL 5 MG/5ML IJ SOLN
1.0000 mg | INTRAMUSCULAR | Status: DC | PRN
  Administered 2017-04-14: 1 mg via INTRAVENOUS
  Filled 2017-04-14: qty 5

## 2017-04-14 NOTE — Patient Instructions (Addendum)
Post-Procedure instructions Instructions:  Apply ice: Fill a plastic sandwich bag with crushed ice. Cover it with a small towel and apply to injection site. Apply for 15 minutes then remove x 15 minutes. Repeat sequence on day of procedure, until you go to bed. The purpose is to minimize swelling and discomfort after procedure.  Apply heat: Apply heat to procedure site starting the day following the procedure. The purpose is to treat any soreness and discomfort from the procedure.  Food intake: Start with clear liquids (like water) and advance to regular food, as tolerated.   Physical activities: Keep activities to a minimum for the first 8 hours after the procedure.   Driving: If you have received any sedation, you are not allowed to drive for 24 hours after your procedure.  Blood thinner: Restart your blood thinner 6 hours after your procedure. (Only for those taking blood thinners)  Insulin: As soon as you can eat, you may resume your normal dosing schedule. (Only for those taking insulin)  Infection prevention: Keep procedure site clean and dry.  Post-procedure Pain Diary: Extremely important that this be done correctly and accurately. Recorded information will be used to determine the next step in treatment.  Pain evaluated is that of treated area only. Do not include pain from an untreated area.  Complete every hour, on the hour, for the initial 8 hours. Set an alarm to help you do this part accurately.  Do not go to sleep and have it completed later. It will not be accurate.  Follow-up appointment: Keep your follow-up appointment after the procedure. Usually 2 weeks for most procedures. (6 weeks in the case of radiofrequency.) Bring you pain diary.  Expect:  From numbing medicine (AKA: Local Anesthetics): Numbness or decrease in pain.  Onset: Full effect within 15 minutes of injected.  Duration: It will depend on the type of local anesthetic used. On the average, 1 to 8  hours.   From steroids: Decrease in swelling or inflammation. Once inflammation is improved, relief of the pain will follow.  Onset of benefits: Depends on the amount of swelling present. The more swelling, the longer it will take for the benefits to be seen.   Duration: Steroids will stay in the system x 2 weeks. Duration of benefits will depend on multiple posibilities including persistent irritating factors.  From procedure: Some discomfort is to be expected once the numbing medicine wears off. This should be minimal if ice and heat are applied as instructed. Call if:  You experience numbness and weakness that gets worse with time, as opposed to wearing off.  New onset bowel or bladder incontinence. (Spinal procedures only)  Emergency Numbers:  Durning business hours (Monday - Thursday, 8:00 AM - 4:00 PM) (Friday, 9:00 AM - 12:00 Noon): [phone]  After hours: [phone] _____________________________________________________________________________________________  Pain Management Discharge Instructions  General Discharge Instructions :  If you need to reach your doctor call: Monday-Friday 8:00 am - 4:00 pm at [phone] or toll free [phone].  After clinic hours [phone] to have operator reach doctor.  Bring all of your medication bottles to all your appointments in the pain clinic.  To cancel or reschedule your appointment with Pain Management please remember to call 24 hours in advance to avoid a fee.  Refer to the educational materials which you have been given on: General Risks, I had my Procedure. Discharge Instructions, Post Sedation.  Post Procedure Instructions:  The drugs you were given will stay in your system until  tomorrow, so for the next 24 hours you should not drive, make any legal decisions or drink any alcoholic beverages.  You may eat anything you prefer, but it is better to start with liquids then soups and crackers, and gradually work  up to solid foods.  Please notify your doctor immediately if you have any unusual bleeding, trouble breathing or pain that is not related to your normal pain.  Depending on the type of procedure that was done, some parts of your body may feel week and/or numb.  This usually clears up by tonight or the next day.  Walk with the use of an assistive device or accompanied by an adult for the 24 hours.  You may use ice on the affected area for the first 24 hours.  Put ice in a Ziploc bag and cover with a towel and place against area 15 minutes on 15 minutes off.  You may switch to heat after 24 hours.Radiofrequency Lesioning, Care After Refer to this sheet in the next few weeks. These instructions provide you with information about caring for yourself after your procedure. Your health care provider may also give you more specific instructions. Your treatment has been planned according to current medical practices, but problems sometimes occur. Call your health care provider if you have any problems or questions after your procedure. What can I expect after the procedure? After the procedure, it is common to have:  Pain from the burned nerve.  Temporary numbness. Follow these instructions at home:  Take over-the-counter and prescription medicines only as told by your health care provider.  Return to your normal activities as told by your health care provider. Ask your health care provider what activities are safe for you.  Pay close attention to how you feel after the procedure. If you start to have pain, write down when it hurts and how it feels. This will help you and your health care provider to know if you need an additional treatment.  Check your needle insertion site every day for signs of infection. Watch for:  Redness, swelling, or pain.  Fluid, blood, or pus.  Keep all follow-up visits as told by your health care provider. This is important. Contact a health care provider if:  Your  pain does not get better.  You have redness, swelling, or pain at the needle insertion site.  You have fluid, blood, or pus coming from the needle insertion site.  You have a fever. Get help right away if:  You develop sudden, severe pain.  You develop numbness or tingling near the procedure site that does not go away. This information is not intended to replace advice given to you by your health care provider. Make sure you discuss any questions you have with your health care provider. Document Released: 07/11/2011 Document Revised: 04/18/2016 Document Reviewed: 12/19/2014 Elsevier Interactive Patient Education  2017 [place]. Radiofrequency Lesioning Radiofrequency lesioning is a procedure that is performed to relieve pain. The procedure is often used for back, neck, or arm pain. Radiofrequency lesioning involves the use of a machine that creates radio waves to make heat. During the procedure, the heat is applied to the nerve that carries the pain signal. The heat damages the nerve and interferes with the pain signal. Pain relief usually starts about 2 weeks after the procedure and lasts for 6 months to 1 year. Tell a health care provider about:  Any allergies you have.  All medicines you are taking, including vitamins, herbs, eye drops,  creams, and over-the-counter medicines.  Any problems you or family members have had with anesthetic medicines.  Any blood disorders you have.  Any surgeries you have had.  Any medical conditions you have.  Whether you are pregnant or may be pregnant. What are the risks? Generally, this is a safe procedure. However, problems may occur, including:  Pain or soreness at the injection site.  Infection at the injection site.  Damage to nerves or blood vessels. What happens before the procedure?  Ask your health care provider about:  Changing or stopping your regular medicines. This is especially important if you are taking diabetes  medicines or blood thinners.  Taking medicines such as aspirin and ibuprofen. These medicines can thin your blood. Do not take these medicines before your procedure if your health care provider instructs you not to.  Follow instructions from your health care provider about eating or drinking restrictions.  Plan to have someone take you home after the procedure.  If you go home right after the procedure, plan to have someone with you for 24 hours. What happens during the procedure?  You will be given one or more of the following:  A medicine to help you relax (sedative).  A medicine to numb the area (local anesthetic).  You will be awake during the procedure. You will need to be able to talk with the health care provider during the procedure.  With the help of a type of X-ray (fluoroscopy), the health care provider will insert a radiofrequency needle into the area to be treated.  Next, a wire that carries the radio waves (electrode) will be put through the radiofrequency needle. An electrical pulse will be sent through the electrode to verify the correct nerve. You will feel a tingling sensation, and you may have muscle twitching.  Then, the tissue that is around the needle tip will be heated by an electric current that is passed using the radiofrequency machine. This will numb the nerves.  A bandage (dressing) will be put on the insertion area after the procedure is done. The procedure may vary among health care providers and hospitals. What happens after the procedure?  Your blood pressure, heart rate, breathing rate, and blood oxygen level will be monitored often until the medicines you were given have worn off.  Return to your normal activities as directed by your health care provider. This information is not intended to replace advice given to you by your health care provider. Make sure you discuss any questions you have with your health care provider. Document Released:  07/10/2011 Document Revised: 04/18/2016 Document Reviewed: 12/19/2014 Elsevier Interactive Patient Education  2017 [place]  What are the risk, side effects and possible complications? Generally speaking, most procedures are safe.  However, with any procedure there are risks, side effects, and the possibility of complications.  The risks and complications are dependent upon the sites that are lesioned, or the type of nerve block to be performed.  The closer the procedure is to the spine, the more serious the risks are.  Great care is taken when placing the radio frequency needles, block needles or lesioning probes, but sometimes complications can occur. 1. Infection: Any time there is an injection through the skin, there is a risk of infection.  This is why sterile conditions are used for these blocks.  There are four possible types of infection. 1. Localized skin infection. 2. Central Nervous System Infection-This can be in the form of Meningitis,  which can be deadly. 3. Epidural Infections-This can be in the form of an epidural abscess, which can cause pressure inside of the spine, causing compression of the spinal cord with subsequent paralysis. This would require an emergency surgery to decompress, and there are no guarantees that the patient would recover from the paralysis. 4. Discitis-This is an infection of the intervertebral discs.  It occurs in about 1% of discography procedures.  It is difficult to treat and it may lead to surgery.        2. Pain: the needles have to go through skin and soft tissues, will cause soreness.       3. Damage to internal structures:  The nerves to be lesioned may be near blood vessels or    other nerves which can be potentially damaged.       4. Bleeding: Bleeding is more common if the patient is taking blood thinners such as  aspirin, Coumadin, Ticiid, Plavix, etc., or if he/she have some genetic predisposition  such as  hemophilia. Bleeding into the spinal canal can cause compression of the spinal  cord with subsequent paralysis.  This would require an emergency surgery to  decompress and there are no guarantees that the patient would recover from the  paralysis.       5. Pneumothorax:  Puncturing of a lung is a possibility, every time a needle is introduced in  the area of the chest or upper back.  Pneumothorax refers to free air around the  collapsed lung(s), inside of the thoracic cavity (chest cavity).  Another two possible  complications related to a similar event would include: Hemothorax and Chylothorax.   These are variations of the Pneumothorax, where instead of air around the collapsed  lung(s), you may have blood or chyle, respectively.       6. Spinal headaches: They may occur with any procedures in the area of the spine.       7. Persistent CSF (Cerebro-Spinal Fluid) leakage: This is a rare problem, but may occur  with prolonged intrathecal or epidural catheters either due to the formation of a fistulous  track or a dural tear.       8. Nerve damage: By working so close to the spinal cord, there is always a possibility of  nerve damage, which could be as serious as a permanent spinal cord injury with  paralysis.       9. Death:  Although rare, severe deadly allergic reactions known as "Anaphylactic  reaction" can occur to any of the medications used.      10. Worsening of the symptoms:  We can always make thing worse.  What are the chances of something like this happening? Chances of any of this occuring are extremely low.  By statistics, you have more of a chance of getting killed in a motor vehicle accident: while driving to the hospital than any of the above occurring .  Nevertheless, you should be aware that they are possibilities.  In general, it is similar to taking a shower.  Everybody knows that you can slip, hit your head and get killed.  Does that mean that you should not shower again?  Nevertheless  always keep in mind that statistics do not mean anything if you happen to be on the wrong side of them.  Even if a procedure has a 1 (one) in a 1,000,000 (million) chance of going wrong, it you happen to be that one..Also, keep in mind that by statistics,  you have more of a chance of having something go wrong when taking medications.  Who should not have this procedure? If you are on a blood thinning medication (e.g. Coumadin, Plavix, see list of "Blood Thinners"), or if you have an active infection going on, you should not have the procedure.  If you are taking any blood thinners, please inform your physician.  How should I prepare for this procedure?  Do not eat or drink anything at least six hours prior to the procedure.  Bring a driver with you .  It cannot be a taxi.  Come accompanied by an adult that can drive you back, and that is strong enough to help you if your legs get weak or numb from the local anesthetic.  Take all of your medicines the morning of the procedure with just enough water to swallow them.  If you have diabetes, make sure that you are scheduled to have your procedure done first thing in the morning, whenever possible.  If you have diabetes, take only half of your insulin dose and notify our nurse that you have done so as soon as you arrive at the clinic.  If you are diabetic, but only take blood sugar pills (oral hypoglycemic), then do not take them on the morning of your procedure.  You may take them after you have had the procedure.  Do not take aspirin or any aspirin-containing medications, at least eleven (11) days prior to the procedure.  They may prolong bleeding.  Wear loose fitting clothing that may be easy to take off and that you would not mind if it got stained with Betadine or blood.  Do not wear any jewelry or perfume  Remove any nail coloring.  It will interfere with some of our monitoring equipment.  NOTE: Remember that this is not meant to be  interpreted as a complete list of all possible complications.  Unforeseen problems may occur.  BLOOD THINNERS The following drugs contain aspirin or other products, which can cause increased bleeding during surgery and should not be taken for 2 weeks prior to and 1 week after surgery.  If you should need take something for relief of minor pain, you may take acetaminophen which is found in Tylenol,m Datril, Anacin-3 and Panadol. It is not blood thinner. The products listed below are.  Do not take any of the products listed below in addition to any listed on your instruction sheet.  A.P.C or A.P.C with Codeine Codeine Phosphate Capsules #3 Ibuprofen Ridaura  ABC compound Congesprin Imuran rimadil  Advil Cope Indocin Robaxisal  Alka-Seltzer Effervescent Pain Reliever and Antacid Coricidin or Coricidin-D  Indomethacin Rufen  Alka-Seltzer plus Cold Medicine Cosprin Ketoprofen S-A-C Tablets  Anacin Analgesic Tablets or Capsules Coumadin Korlgesic Salflex  Anacin Extra Strength Analgesic tablets or capsules CP-2 Tablets Lanoril Salicylate  Anaprox Cuprimine Capsules Levenox Salocol  Anexsia-D Dalteparin Magan Salsalate  Anodynos Darvon compound Magnesium Salicylate Sine-off  Ansaid Dasin Capsules Magsal Sodium Salicylate  Anturane Depen Capsules Marnal Soma  APF Arthritis pain formula Dewitt's Pills Measurin Stanback  Argesic Dia-Gesic Meclofenamic Sulfinpyrazone  Arthritis Bayer Timed Release Aspirin Diclofenac Meclomen Sulindac  Arthritis pain formula Anacin Dicumarol Medipren Supac  Analgesic (Safety coated) Arthralgen Diffunasal Mefanamic Suprofen  Arthritis Strength Bufferin Dihydrocodeine Mepro Compound Suprol  Arthropan liquid Dopirydamole Methcarbomol with Aspirin Synalgos  ASA tablets/Enseals Disalcid Micrainin Tagament  Ascriptin Doan's Midol Talwin  Ascriptin A/D Dolene Mobidin Tanderil  Ascriptin Extra Strength Dolobid Moblgesic Ticlid  Ascriptin with Codeine Doloprin  or Doloprin  with Codeine Momentum Tolectin  Asperbuf Duoprin Mono-gesic Trendar  Aspergum Duradyne Motrin or Motrin IB Triminicin  Aspirin plain, buffered or enteric coated Durasal Myochrisine Trigesic  Aspirin Suppositories Easprin Nalfon Trillsate  Aspirin with Codeine Ecotrin Regular or Extra Strength Naprosyn Uracel  Atromid-S Efficin Naproxen Ursinus  Auranofin Capsules Elmiron Neocylate Vanquish  Axotal Emagrin Norgesic Verin  Azathioprine Empirin or Empirin with Codeine Normiflo Vitamin E  Azolid Emprazil Nuprin Voltaren  Bayer Aspirin plain, buffered or children's or timed BC Tablets or powders Encaprin Orgaran Warfarin Sodium  Buff-a-Comp Enoxaparin Orudis Zorpin  Buff-a-Comp with Codeine Equegesic Os-Cal-Gesic   Buffaprin Excedrin plain, buffered or Extra Strength Oxalid   Bufferin Arthritis Strength Feldene Oxphenbutazone   Bufferin plain or Extra Strength Feldene Capsules Oxycodone with Aspirin   Bufferin with Codeine Fenoprofen Fenoprofen Pabalate or Pabalate-SF   Buffets II Flogesic Panagesic   Buffinol plain or Extra Strength Florinal or Florinal with Codeine Panwarfarin   Buf-Tabs Flurbiprofen Penicillamine   Butalbital Compound Four-way cold tablets Penicillin   Butazolidin Fragmin Pepto-Bismol   Carbenicillin Geminisyn Percodan   Carna Arthritis Reliever Geopen Persantine   Carprofen Gold's salt Persistin   Chloramphenicol Goody's Phenylbutazone   Chloromycetin Haltrain Piroxlcam   Clmetidine heparin Plaquenil   Cllnoril Hyco-pap Ponstel   Clofibrate Hydroxy chloroquine Propoxyphen         Before stopping any of these medications, be sure to consult the physician who ordered them.  Some, such as Coumadin (Warfarin) are ordered to prevent or treat serious conditions such as "deep thrombosis", "pumonary embolisms", and other heart problems.  The amount of time that you may need off of the medication may also vary with the medication and the reason for which you were taking it.   If you are taking any of these medications, please make sure you notify your pain physician before you undergo any procedures.

## 2017-04-14 NOTE — Progress Notes (Signed)
Patient's Name: Cheryl [name]  MRN: [id number]  Referring Provider: Delano Metz, MD  DOB: 03-14-1951  PCP: Malva Limes, MD  DOS: 04/14/2017  Note by: Sydnee Levans. Laban Emperor, MD  Service setting: Ambulatory outpatient  Location: [facility] (AMB) Pain Management Facility  Visit type: Procedure  Specialty: Interventional Pain Management  Patient type: Established   Primary Reason for Visit: Interventional Pain Management Treatment. CC: Back Pain (low left)  Procedure:  Anesthesia, Analgesia, Anxiolysis:  Type: Therapeutic Medial Branch Facet Radiofrequency Ablation Region: Lumbar Level: L2, L3, L4, & L5 Medial Branch Level(s) Laterality: Left-Sided  Type: Local Anesthesia with Moderate (Conscious) Sedation Local Anesthetic: Lidocaine 1% Route: Intravenous (IV) IV Access: Secured Sedation: Meaningful verbal contact was maintained at all times during the procedure  Indication(s): Analgesia and Anxiety  Indications: 1. Lumbar facet syndrome (Location of Primary Source of Pain) (Bilateral) (L>R)   2. Chronic low back pain (Location of Primary Source of Pain) (Bilateral) (L>R)   3. Lumbar spondylosis   4. Lumbar spondylosis   5. Acute postoperative pain    [name] has either failed to respond, was unable to tolerate, or simply did not get enough benefit from other more conservative therapies including, but not limited to: 1. Over-the-counter medications 2. Anti-inflammatory medications 3. Muscle relaxants 4. Membrane stabilizers 5. Opioids 6. Physical therapy 7. Modalities (Heat, ice, etc.) 8. Invasive techniques such as nerve blocks. [name] has attained more than 50% relief of the pain from a series of diagnostic injections conducted in separate occasions.  Pain Score: Pre-procedure: 3 /10 Post-procedure: 0-No pain/10  Pre-op Assessment:  Previous date of service: 03/06/17 Service provided: Evaluation Cheryl [name] is a 66 y.o. (year old), female patient, seen today for  interventional treatment. She  has a past surgical history that includes Appendectomy; Spine surgery; Foot surgery (Bilateral); cardiac catherization (10/31/2009); abdomnal aortic stent (05/30/2008); Abdominal hysterectomy (1975); Cholecystectomy (1972); Cervical fusion (C5 - 6/C6-7); Appendectomy; Colonoscopy with propofol (N/A, 07/27/2015); and Esophagogastroduodenoscopy (egd) with propofol (N/A, 07/27/2015). Her primarily concern today is the Back Pain (low left)  Initial Vital Signs: There were no vitals taken for this visit. BMI: 19.44 kg/m  Risk Assessment: Allergies: Reviewed. She is allergic to percocet [oxycodone-acetaminophen]; aspirin; codeine; propoxyphene; and sulfa antibiotics.  Allergy Precautions: None required Coagulopathies: Reviewed. None identified.  Blood-thinner therapy: None at this time Active Infection(s): Reviewed. None identified. Cheryl [name] is afebrile  Site Confirmation: Cheryl [name] was asked to confirm the procedure and laterality before marking the site. Prior to starting the procedure, the patient was asked to tell us where the upper and lower boundaries of her back pain were located. These boundaries were checked on the fluoroscopic guidance and found to be T11 down to L5 Procedure checklist: Completed Consent: Before the procedure and under the influence of no sedative(s), amnesic(s), or anxiolytics, the patient was informed of the treatment options, risks and possible complications. To fulfill our ethical and legal obligations, as recommended by the American Medical Association's Code of Ethics, I have informed the patient of my clinical impression; the nature and purpose of the treatment or procedure; the risks, benefits, and possible complications of the intervention; the alternatives, including doing nothing; the risk(s) and benefit(s) of the alternative treatment(s) or procedure(s); and the risk(s) and benefit(s) of doing nothing. The patient was provided information  about the general risks and possible complications associated with the procedure. These may include, but are not limited to: failure to achieve desired goals, infection, bleeding, organ or nerve damage, allergic  reactions, paralysis, and death. In addition, the patient was informed of those risks and complications associated to Spine-related procedures, such as failure to decrease pain; infection (i.e.: Meningitis, epidural or intraspinal abscess); bleeding (i.e.: epidural hematoma, subarachnoid hemorrhage, or any other type of intraspinal or peri-dural bleeding); organ or nerve damage (i.e.: Any type of peripheral nerve, nerve root, or spinal cord injury) with subsequent damage to sensory, motor, and/or autonomic systems, resulting in permanent pain, numbness, and/or weakness of one or several areas of the body; allergic reactions; (i.e.: anaphylactic reaction); and/or death. Furthermore, the patient was informed of those risks and complications associated with the medications. These include, but are not limited to: allergic reactions (i.e.: anaphylactic or anaphylactoid reaction(s)); adrenal axis suppression; blood sugar elevation that in diabetics may result in ketoacidosis or comma; water retention that in patients with history of congestive heart failure may result in shortness of breath, pulmonary edema, and decompensation with resultant heart failure; weight gain; swelling or edema; medication-induced neural toxicity; particulate matter embolism and blood vessel occlusion with resultant organ, and/or nervous system infarction; and/or aseptic necrosis of one or more joints. Finally, the patient was informed that Medicine is not an exact science; therefore, there is also the possibility of unforeseen or unpredictable risks and/or possible complications that may result in a catastrophic outcome. The patient indicated having understood very clearly. We have given the patient no guarantees and we have made no  promises. Enough time was given to the patient to ask questions, all of which were answered to the patient's satisfaction. Cheryl [name] has indicated that she wanted to continue with the procedure. Attestation: I, the ordering provider, attest that I have discussed with the patient the benefits, risks, side-effects, alternatives, likelihood of achieving goals, and potential problems during recovery for the procedure that I have provided informed consent. Date: 04/14/2017; Time: 10:34 AM  Pre-Procedure Preparation:  Monitoring: As per clinic protocol. Respiration, ETCO2, SpO2, BP, heart rate and rhythm monitor placed and checked for adequate function Safety Precautions: Patient was assessed for positional comfort and pressure points before starting the procedure. Time-out: I initiated and conducted the "Time-out" before starting the procedure, as per protocol. The patient was asked to participate by confirming the accuracy of the "Time Out" information. Verification of the correct person, site, and procedure were performed and confirmed by me, the nursing staff, and the patient. "Time-out" conducted as per Joint Commission's Universal Protocol (UP.01.01.01). "Time-out" Date & Time: 04/14/2017; 1110 hrs.  Description of Procedure Process:   Position: Prone Target Area: For Lumbar Facet blocks, the target is the groove formed by the junction of the transverse process and superior articular process. For the L5 dorsal ramus, the target is the notch between superior articular process and sacral ala. For the S1 dorsal ramus, the target is the superior and lateral edge of the posterior S1 Sacral foramen. Approach: Paraspinal approach. Area Prepped: Entire Posterior Lumbosacral Region Prepping solution: Hibiclens (4.0% Chlorhexidine gluconate solution) Safety Precautions: Aspiration looking for blood return was conducted prior to all injections. At no point did we inject any substances, as a needle was being  advanced. No attempts were made at seeking any paresthesias. Safe injection practices and needle disposal techniques used. Medications properly checked for expiration dates. SDV (single dose vial) medications used. Description of the Procedure: Protocol guidelines were followed. The patient was placed in position over the fluoroscopy table. The target area was identified and the area prepped in the usual manner. Skin desensitized using vapocoolant spray. Skin &  deeper tissues infiltrated with local anesthetic. Appropriate amount of time allowed to pass for local anesthetics to take effect. Radiofrequency needles were introduced to the area of the medial branch at the junction of the superior articular process and transverse process using fluoroscopy. Using the [employer], sensory stimulation using 50 Hz was used to locate & identify the nerve, making sure that the needle was positioned such that there was no sensory stimulation below 0.3 V or above 0.7 V. Stimulation using 2 Hz was used to evaluate the motor component. Care was taken not to lesion any nerves that demonstrated motor stimulation of the lower extremities at an output of less than 2.5 times that of the sensory threshold, or a maximum of 2.0 V. Once satisfactory placement of the needles was achieved, the above solution was slowly injected after negative aspiration. After waiting for at least 2 minutes, the ablation was performed at 80 degrees C for 60 seconds.The needles were then removed and the area cleansed, making sure to leave some of the prepping solution back to take advantage of its long term bactericidal properties. Vitals:   04/14/17 1142 04/14/17 1152 04/14/17 1202 04/14/17 1212  BP: (!) 143/66 (!) 150/69 (!) 155/75 (!) 143/80  Pulse: (!) 59 65 65 72  Resp: 10 11 15 16   Temp:  97.4 F (36.3 C)    SpO2: 100% 97% 96% 98%  Weight:      Height:        Start Time: 1111 hrs. End Time: 1142 hrs. Materials &  Medications:  Needle(s) Type: Teflon-coated, curved tip, Radiofrequency needle(s) Gauge: 22G Length: 10cm Medication(s): We administered lactated ringers, midazolam, fentaNYL, and ropivacaine (PF) 2 mg/mL (0.2%). Please see chart orders for dosing details.  Imaging Guidance (Spinal):  Type of Imaging Technique: Fluoroscopy Guidance (Spinal) Indication(s): Assistance in needle guidance and placement for procedures requiring needle placement in or near specific anatomical locations not easily accessible without such assistance. Exposure Time: Please see nurses notes. Contrast: None used. Fluoroscopic Guidance: I was personally present during the use of fluoroscopy. "Tunnel Vision Technique" used to obtain the best possible view of the target area. Parallax error corrected before commencing the procedure. "Direction-depth-direction" technique used to introduce the needle under continuous pulsed fluoroscopy. Once target was reached, antero-posterior, oblique, and lateral fluoroscopic projection used confirm needle placement in all planes. Images permanently stored in EMR. Interpretation: No contrast injected. I personally interpreted the imaging intraoperatively. Adequate needle placement confirmed in multiple planes. Permanent images saved into the patient's record.  Antibiotic Prophylaxis:  Indication(s): None identified Antibiotic given: None  Post-operative Assessment:  EBL: None Complications: No immediate post-treatment complications observed by team, or reported by patient. Note: The patient tolerated the entire procedure well. A repeat set of vitals were taken after the procedure and the patient was kept under observation following institutional policy, for this type of procedure. Post-procedural neurological assessment was performed, showing return to baseline, prior to discharge. The patient was provided with post-procedure discharge instructions, including a section on how to identify  potential problems. Should any problems arise concerning this procedure, the patient was given instructions to immediately contact us, at any time, without hesitation. In any case, we plan to contact the patient by telephone for a follow-up status report regarding this interventional procedure. Comments:  No additional relevant information.  Plan of Care  Disposition: Discharge home  Discharge Date & Time: 04/14/2017; 1213 hrs.  Physician-requested Follow-up:  Return for contralateral RF (in 2-wks), by MD.  Future  Appointments Date Time Provider Department Center  04/15/2017 11:00 AM Pleasant, Dennard Schaumann, RN [facility] None  04/25/2017 10:30 AM [facility]-NURSE HEALTH ADVISOR [facility] None  04/25/2017 11:00 AM Malva Limes, MD [facility] None  06/05/2017 1:15 PM Barbette Merino, NP [facility] None   Medications ordered for procedure: Meds ordered this encounter  Medications  . lactated ringers infusion 1,000 mL  . midazolam (VERSED) 5 MG/5ML injection 1-2 mg    Make sure Flumazenil is available in the pyxis when using this medication. If oversedation occurs, administer 0.2 mg IV over 15 sec. If after 45 sec no response, administer 0.2 mg again over 1 min; may repeat at 1 min intervals; not to exceed 4 doses (1 mg)  . fentaNYL (SUBLIMAZE) injection 25-50 mcg    Make sure Narcan is available in the pyxis when using this medication. In the event of respiratory depression (RR< 8/min): Titrate NARCAN (naloxone) in increments of 0.1 to 0.2 mg IV at 2-3 minute intervals, until desired degree of reversal.  . lidocaine (PF) (XYLOCAINE) 1 % injection 10 mL  . ropivacaine (PF) 2 mg/mL (0.2%) (NAROPIN) injection 9 mL  . cyclobenzaprine (FLEXERIL) 5 MG tablet    Sig: Take 1 tablet (5 mg total) by mouth 3 (three) times daily as needed for muscle spasms (for disconfort after radiofrequency).    Dispense:  45 tablet    Refill:  0    Do not place this medication, or any other prescription from our practice, on  "Automatic Refill". Warn patient about sedation.   Medications administered: We administered lactated ringers, midazolam, fentaNYL, and ropivacaine (PF) 2 mg/mL (0.2%).  See the medical record for exact dosing, route, and time of administration.  Lab-work, Procedure(s), & Referral(s) Ordered: Orders Placed This Encounter  Procedures  . Radiofrequency,Lumbar  . DG C-Arm 1-60 Min-No Report  . Informed Consent Details: Transcribe to consent form and obtain patient signature  . Provider attestation of informed consent for procedure/surgical case  . Verify informed consent  . Discharge instructions  . Follow-up  . Keep Oxygen Setup At Bedside   Imaging Ordered: Results for orders placed in visit on 01/22/17  DG C-Arm 1-60 Min-No Report   Narrative Fluoroscopy was utilized by the requesting physician.  No radiographic  interpretation.    New Prescriptions   CYCLOBENZAPRINE (FLEXERIL) 5 MG TABLET    Take 1 tablet (5 mg total) by mouth 3 (three) times daily as needed for muscle spasms (for disconfort after radiofrequency).   Primary Care Physician: Malva Limes, MD Location: [facility] Note by: Sydnee Levans. Laban Emperor, M.D, DABA, DABAPM, DABPM, DABIPP, FIPP Date: 04/14/2017; Time: 12:48 PM  Disclaimer:  Medicine is not an exact science. The only guarantee in medicine is that nothing is guaranteed. It is important to note that the decision to proceed with this intervention was based on the information collected from the patient. The Data and conclusions were drawn from the patient's questionnaire, the interview, and the physical examination. Because the information was provided in large part by the patient, it cannot be guaranteed that it has not been purposely or unconsciously manipulated. Every effort has been made to obtain as much relevant data as possible for this evaluation. It is important to note that the conclusions that lead to this procedure are derived  in large part from the available data. Always take into account that the treatment will also be dependent on availability of resources and existing treatment guidelines, considered by other Pain Management  Practitioners as being common knowledge and practice, at the time of the intervention. For Medico-Legal purposes, it is also important to point out that variation in procedural techniques and pharmacological choices are the acceptable norm. The indications, contraindications, technique, and results of the above procedure should only be interpreted and judged by a Board-Certified Interventional Pain Specialist with extensive familiarity and expertise in the same exact procedure and technique.  Instructions provided at this appointment: Patient Instructions   Post-Procedure instructions Instructions:  Apply ice: Fill a plastic sandwich bag with crushed ice. Cover it with a small towel and apply to injection site. Apply for 15 minutes then remove x 15 minutes. Repeat sequence on day of procedure, until you go to bed. The purpose is to minimize swelling and discomfort after procedure.  Apply heat: Apply heat to procedure site starting the day following the procedure. The purpose is to treat any soreness and discomfort from the procedure.  Food intake: Start with clear liquids (like water) and advance to regular food, as tolerated.   Physical activities: Keep activities to a minimum for the first 8 hours after the procedure.   Driving: If you have received any sedation, you are not allowed to drive for 24 hours after your procedure.  Blood thinner: Restart your blood thinner 6 hours after your procedure. (Only for those taking blood thinners)  Insulin: As soon as you can eat, you may resume your normal dosing schedule. (Only for those taking insulin)  Infection prevention: Keep procedure site clean and dry.  Post-procedure Pain Diary: Extremely important that this be done correctly and  accurately. Recorded information will be used to determine the next step in treatment.  Pain evaluated is that of treated area only. Do not include pain from an untreated area.  Complete every hour, on the hour, for the initial 8 hours. Set an alarm to help you do this part accurately.  Do not go to sleep and have it completed later. It will not be accurate.  Follow-up appointment: Keep your follow-up appointment after the procedure. Usually 2 weeks for most procedures. (6 weeks in the case of radiofrequency.) Bring you pain diary.  Expect:  From numbing medicine (AKA: Local Anesthetics): Numbness or decrease in pain.  Onset: Full effect within 15 minutes of injected.  Duration: It will depend on the type of local anesthetic used. On the average, 1 to 8 hours.   From steroids: Decrease in swelling or inflammation. Once inflammation is improved, relief of the pain will follow.  Onset of benefits: Depends on the amount of swelling present. The more swelling, the longer it will take for the benefits to be seen.   Duration: Steroids will stay in the system x 2 weeks. Duration of benefits will depend on multiple posibilities including persistent irritating factors.  From procedure: Some discomfort is to be expected once the numbing medicine wears off. This should be minimal if ice and heat are applied as instructed. Call if:  You experience numbness and weakness that gets worse with time, as opposed to wearing off.  New onset bowel or bladder incontinence. (Spinal procedures only)  Emergency Numbers:  Durning business hours (Monday - Thursday, 8:00 AM - 4:00 PM) (Friday, 9:00 AM - 12:00 Noon): [phone]  After hours: [phone] _____________________________________________________________________________________________  Pain Management Discharge Instructions  General Discharge Instructions :  If you need to reach your doctor call: Monday-Friday 8:00 am - 4:00 pm at  [phone] or toll free [phone].  After clinic hours [phone]  to have operator reach doctor.  Bring all of your medication bottles to all your appointments in the pain clinic.  To cancel or reschedule your appointment with Pain Management please remember to call 24 hours in advance to avoid a fee.  Refer to the educational materials which you have been given on: General Risks, I had my Procedure. Discharge Instructions, Post Sedation.  Post Procedure Instructions:  The drugs you were given will stay in your system until tomorrow, so for the next 24 hours you should not drive, make any legal decisions or drink any alcoholic beverages.  You may eat anything you prefer, but it is better to start with liquids then soups and crackers, and gradually work up to solid foods.  Please notify your doctor immediately if you have any unusual bleeding, trouble breathing or pain that is not related to your normal pain.  Depending on the type of procedure that was done, some parts of your body may feel week and/or numb.  This usually clears up by tonight or the next day.  Walk with the use of an assistive device or accompanied by an adult for the 24 hours.  You may use ice on the affected area for the first 24 hours.  Put ice in a Ziploc bag and cover with a towel and place against area 15 minutes on 15 minutes off.  You may switch to heat after 24 hours.Radiofrequency Lesioning, Care After Refer to this sheet in the next few weeks. These instructions provide you with information about caring for yourself after your procedure. Your health care provider may also give you more specific instructions. Your treatment has been planned according to current medical practices, but problems sometimes occur. Call your health care provider if you have any problems or questions after your procedure. What can I expect after the procedure? After the procedure, it is common to have:  Pain from the burned  nerve.  Temporary numbness. Follow these instructions at home:  Take over-the-counter and prescription medicines only as told by your health care provider.  Return to your normal activities as told by your health care provider. Ask your health care provider what activities are safe for you.  Pay close attention to how you feel after the procedure. If you start to have pain, write down when it hurts and how it feels. This will help you and your health care provider to know if you need an additional treatment.  Check your needle insertion site every day for signs of infection. Watch for:  Redness, swelling, or pain.  Fluid, blood, or pus.  Keep all follow-up visits as told by your health care provider. This is important. Contact a health care provider if:  Your pain does not get better.  You have redness, swelling, or pain at the needle insertion site.  You have fluid, blood, or pus coming from the needle insertion site.  You have a fever. Get help right away if:  You develop sudden, severe pain.  You develop numbness or tingling near the procedure site that does not go away. This information is not intended to replace advice given to you by your health care provider. Make sure you discuss any questions you have with your health care provider. Document Released: 07/11/2011 Document Revised: 04/18/2016 Document Reviewed: 12/19/2014 Elsevier Interactive Patient Education  2017 [employer] Radiofrequency Lesioning Radiofrequency lesioning is a procedure that is performed to relieve pain. The procedure is often used for back, neck, or arm pain. Radiofrequency lesioning  involves the use of a machine that creates radio waves to make heat. During the procedure, the heat is applied to the nerve that carries the pain signal. The heat damages the nerve and interferes with the pain signal. Pain relief usually starts about 2 weeks after the procedure and lasts for 6 months to 1 year. Tell a  health care provider about:  Any allergies you have.  All medicines you are taking, including vitamins, herbs, eye drops, creams, and over-the-counter medicines.  Any problems you or family members have had with anesthetic medicines.  Any blood disorders you have.  Any surgeries you have had.  Any medical conditions you have.  Whether you are pregnant or may be pregnant. What are the risks? Generally, this is a safe procedure. However, problems may occur, including:  Pain or soreness at the injection site.  Infection at the injection site.  Damage to nerves or blood vessels. What happens before the procedure?  Ask your health care provider about:  Changing or stopping your regular medicines. This is especially important if you are taking diabetes medicines or blood thinners.  Taking medicines such as aspirin and ibuprofen. These medicines can thin your blood. Do not take these medicines before your procedure if your health care provider instructs you not to.  Follow instructions from your health care provider about eating or drinking restrictions.  Plan to have someone take you home after the procedure.  If you go home right after the procedure, plan to have someone with you for 24 hours. What happens during the procedure?  You will be given one or more of the following:  A medicine to help you relax (sedative).  A medicine to numb the area (local anesthetic).  You will be awake during the procedure. You will need to be able to talk with the health care provider during the procedure.  With the help of a type of X-ray (fluoroscopy), the health care provider will insert a radiofrequency needle into the area to be treated.  Next, a wire that carries the radio waves (electrode) will be put through the radiofrequency needle. An electrical pulse will be sent through the electrode to verify the correct nerve. You will feel a tingling sensation, and you may have muscle  twitching.  Then, the tissue that is around the needle tip will be heated by an electric current that is passed using the radiofrequency machine. This will numb the nerves.  A bandage (dressing) will be put on the insertion area after the procedure is done. The procedure may vary among health care providers and hospitals. What happens after the procedure?  Your blood pressure, heart rate, breathing rate, and blood oxygen level will be monitored often until the medicines you were given have worn off.  Return to your normal activities as directed by your health care provider. This information is not intended to replace advice given to you by your health care provider. Make sure you discuss any questions you have with your health care provider. Document Released: 07/10/2011 Document Revised: 04/18/2016 Document Reviewed: 12/19/2014 Elsevier Interactive Patient Education  2017 [employer] GENERAL RISKS AND COMPLICATIONS  What are the risk, side effects and possible complications? Generally speaking, most procedures are safe.  However, with any procedure there are risks, side effects, and the possibility of complications.  The risks and complications are dependent upon the sites that are lesioned, or the type of nerve block to be performed.  The closer the procedure is to the spine,  the more serious the risks are.  Great care is taken when placing the radio frequency needles, block needles or lesioning probes, but sometimes complications can occur. 1. Infection: Any time there is an injection through the skin, there is a risk of infection.  This is why sterile conditions are used for these blocks.  There are four possible types of infection. 1. Localized skin infection. 2. Central Nervous System Infection-This can be in the form of Meningitis, which can be deadly. 3. Epidural Infections-This can be in the form of an epidural abscess, which can cause pressure inside of the spine, causing  compression of the spinal cord with subsequent paralysis. This would require an emergency surgery to decompress, and there are no guarantees that the patient would recover from the paralysis. 4. Discitis-This is an infection of the intervertebral discs.  It occurs in about 1% of discography procedures.  It is difficult to treat and it may lead to surgery.        2. Pain: the needles have to go through skin and soft tissues, will cause soreness.       3. Damage to internal structures:  The nerves to be lesioned may be near blood vessels or    other nerves which can be potentially damaged.       4. Bleeding: Bleeding is more common if the patient is taking blood thinners such as  aspirin, Coumadin, Ticiid, Plavix, etc., or if he/she have some genetic predisposition  such as hemophilia. Bleeding into the spinal canal can cause compression of the spinal  cord with subsequent paralysis.  This would require an emergency surgery to  decompress and there are no guarantees that the patient would recover from the  paralysis.       5. Pneumothorax:  Puncturing of a lung is a possibility, every time a needle is introduced in  the area of the chest or upper back.  Pneumothorax refers to free air around the  collapsed lung(s), inside of the thoracic cavity (chest cavity).  Another two possible  complications related to a similar event would include: Hemothorax and Chylothorax.   These are variations of the Pneumothorax, where instead of air around the collapsed  lung(s), you may have blood or chyle, respectively.       6. Spinal headaches: They may occur with any procedures in the area of the spine.       7. Persistent CSF (Cerebro-Spinal Fluid) leakage: This is a rare problem, but may occur  with prolonged intrathecal or epidural catheters either due to the formation of a fistulous  track or a dural tear.       8. Nerve damage: By working so close to the spinal cord, there is always a possibility of  nerve damage,  which could be as serious as a permanent spinal cord injury with  paralysis.       9. Death:  Although rare, severe deadly allergic reactions known as "Anaphylactic  reaction" can occur to any of the medications used.      10. Worsening of the symptoms:  We can always make thing worse.  What are the chances of something like this happening? Chances of any of this occuring are extremely low.  By statistics, you have more of a chance of getting killed in a motor vehicle accident: while driving to the hospital than any of the above occurring .  Nevertheless, you should be aware that they are possibilities.  In general, it is similar to  taking a shower.  Everybody knows that you can slip, hit your head and get killed.  Does that mean that you should not shower again?  Nevertheless always keep in mind that statistics do not mean anything if you happen to be on the wrong side of them.  Even if a procedure has a 1 (one) in a 1,000,000 (million) chance of going wrong, it you happen to be that one..Also, keep in mind that by statistics, you have more of a chance of having something go wrong when taking medications.  Who should not have this procedure? If you are on a blood thinning medication (e.g. Coumadin, Plavix, see list of "Blood Thinners"), or if you have an active infection going on, you should not have the procedure.  If you are taking any blood thinners, please inform your physician.  How should I prepare for this procedure?  Do not eat or drink anything at least six hours prior to the procedure.  Bring a driver with you .  It cannot be a taxi.  Come accompanied by an adult that can drive you back, and that is strong enough to help you if your legs get weak or numb from the local anesthetic.  Take all of your medicines the morning of the procedure with just enough water to swallow them.  If you have diabetes, make sure that you are scheduled to have your procedure done first thing in the morning,  whenever possible.  If you have diabetes, take only half of your insulin dose and notify our nurse that you have done so as soon as you arrive at the clinic.  If you are diabetic, but only take blood sugar pills (oral hypoglycemic), then do not take them on the morning of your procedure.  You may take them after you have had the procedure.  Do not take aspirin or any aspirin-containing medications, at least eleven (11) days prior to the procedure.  They may prolong bleeding.  Wear loose fitting clothing that may be easy to take off and that you would not mind if it got stained with Betadine or blood.  Do not wear any jewelry or perfume  Remove any nail coloring.  It will interfere with some of our monitoring equipment.  NOTE: Remember that this is not meant to be interpreted as a complete list of all possible complications.  Unforeseen problems may occur.  BLOOD THINNERS The following drugs contain aspirin or other products, which can cause increased bleeding during surgery and should not be taken for 2 weeks prior to and 1 week after surgery.  If you should need take something for relief of minor pain, you may take acetaminophen which is found in Tylenol,m Datril, Anacin-3 and Panadol. It is not blood thinner. The products listed below are.  Do not take any of the products listed below in addition to any listed on your instruction sheet.  A.P.C or A.P.C with Codeine Codeine Phosphate Capsules #3 Ibuprofen Ridaura  ABC compound Congesprin Imuran rimadil  Advil Cope Indocin Robaxisal  Alka-Seltzer Effervescent Pain Reliever and Antacid Coricidin or Coricidin-D  Indomethacin Rufen  Alka-Seltzer plus Cold Medicine Cosprin Ketoprofen S-A-C Tablets  Anacin Analgesic Tablets or Capsules Coumadin Korlgesic Salflex  Anacin Extra Strength Analgesic tablets or capsules CP-2 Tablets Lanoril Salicylate  Anaprox Cuprimine Capsules Levenox Salocol  Anexsia-D Dalteparin Magan Salsalate  Anodynos Darvon  compound Magnesium Salicylate Sine-off  Ansaid Dasin Capsules Magsal Sodium Salicylate  Anturane Depen Capsules Marnal Soma  APF Arthritis pain  formula Dewitt's Pills Measurin Stanback  Argesic Dia-Gesic Meclofenamic Sulfinpyrazone  Arthritis Bayer Timed Release Aspirin Diclofenac Meclomen Sulindac  Arthritis pain formula Anacin Dicumarol Medipren Supac  Analgesic (Safety coated) Arthralgen Diffunasal Mefanamic Suprofen  Arthritis Strength Bufferin Dihydrocodeine Mepro Compound Suprol  Arthropan liquid Dopirydamole Methcarbomol with Aspirin Synalgos  ASA tablets/Enseals Disalcid Micrainin Tagament  Ascriptin Doan's Midol Talwin  Ascriptin A/D Dolene Mobidin Tanderil  Ascriptin Extra Strength Dolobid Moblgesic Ticlid  Ascriptin with Codeine Doloprin or Doloprin with Codeine Momentum Tolectin  Asperbuf Duoprin Mono-gesic Trendar  Aspergum Duradyne Motrin or Motrin IB Triminicin  Aspirin plain, buffered or enteric coated Durasal Myochrisine Trigesic  Aspirin Suppositories Easprin Nalfon Trillsate  Aspirin with Codeine Ecotrin Regular or Extra Strength Naprosyn Uracel  Atromid-S Efficin Naproxen Ursinus  Auranofin Capsules Elmiron Neocylate Vanquish  Axotal Emagrin Norgesic Verin  Azathioprine Empirin or Empirin with Codeine Normiflo Vitamin E  Azolid Emprazil Nuprin Voltaren  Bayer Aspirin plain, buffered or children's or timed BC Tablets or powders Encaprin Orgaran Warfarin Sodium  Buff-a-Comp Enoxaparin Orudis Zorpin  Buff-a-Comp with Codeine Equegesic Os-Cal-Gesic   Buffaprin Excedrin plain, buffered or Extra Strength Oxalid   Bufferin Arthritis Strength Feldene Oxphenbutazone   Bufferin plain or Extra Strength Feldene Capsules Oxycodone with Aspirin   Bufferin with Codeine Fenoprofen Fenoprofen Pabalate or Pabalate-SF   Buffets II Flogesic Panagesic   Buffinol plain or Extra Strength Florinal or Florinal with Codeine Panwarfarin   Buf-Tabs Flurbiprofen Penicillamine   Butalbital  Compound Four-way cold tablets Penicillin   Butazolidin Fragmin Pepto-Bismol   Carbenicillin Geminisyn Percodan   Carna Arthritis Reliever Geopen Persantine   Carprofen Gold's salt Persistin   Chloramphenicol Goody's Phenylbutazone   Chloromycetin Haltrain Piroxlcam   Clmetidine heparin Plaquenil   Cllnoril Hyco-pap Ponstel   Clofibrate Hydroxy chloroquine Propoxyphen         Before stopping any of these medications, be sure to consult the physician who ordered them.  Some, such as Coumadin (Warfarin) are ordered to prevent or treat serious conditions such as "deep thrombosis", "pumonary embolisms", and other heart problems.  The amount of time that you may need off of the medication may also vary with the medication and the reason for which you were taking it.  If you are taking any of these medications, please make sure you notify your pain physician before you undergo any procedures.

## 2017-04-14 NOTE — Progress Notes (Signed)
Safety precautions to be maintained throughout the outpatient stay will include: orient to surroundings, keep bed in low position, maintain call bell within reach at all times, provide assistance with transfer out of bed and ambulation.  

## 2017-04-15 ENCOUNTER — Encounter: Payer: Self-pay | Admitting: *Deleted

## 2017-04-15 ENCOUNTER — Other Ambulatory Visit: Payer: Self-pay | Admitting: Pain Medicine

## 2017-04-15 ENCOUNTER — Other Ambulatory Visit: Payer: Self-pay | Admitting: *Deleted

## 2017-04-15 DIAGNOSIS — J449 Chronic obstructive pulmonary disease, unspecified: Secondary | ICD-10-CM | POA: Diagnosis not present

## 2017-04-15 NOTE — Patient Outreach (Signed)
[facility]) Care Management  04/15/2017   Cheryl [name] 1951-06-16 [id number]  RN Health Coach telephone call to patient.  [employer] compliance verified. Per patient she is still smoking. Per patient her and her husband goes on the back porch with the fan on.. Patient stated that she is doing fair. She does not have to use the oxygen every day. Per patient the heat makes the breathing much worse. Per patient she falls frequently. She uses a rolling walker. Per patient she mostly ends up with bruise and her skin is very thin so she has multiple skin tears. Per patient she has fallen and bumped her head but did not have to go to the hospital. She had a knot and bruise on her forehead.   Patient has agreed to follow up outreach calls.     Current Medications:  Current Outpatient Prescriptions  Medication Sig Dispense Refill  . acetaminophen (TYLENOL) 325 MG tablet Take 2 tablets (650 mg total) by mouth every 6 (six) hours as needed for mild pain (or Fever >/= 101). 30 tablet 0  . ADVAIR DISKUS 250-50 MCG/DOSE AEPB inhale 1 dose by mouth twice a day 60 each 11  . albuterol (PROVENTIL HFA;VENTOLIN HFA) 108 (90 BASE) MCG/ACT inhaler Inhale 2 puffs into the lungs every 4 (four) hours as needed for wheezing or shortness of breath. 18 g 3  . albuterol (PROVENTIL) (2.5 MG/3ML) 0.083% nebulizer solution Take 3 mLs (2.5 mg total) by nebulization every 4 (four) hours as needed for wheezing. 75 mL 12  . ALPRAZolam (XANAX) 0.5 MG tablet Take by mouth as needed.     . benzonatate (TESSALON PERLES) 100 MG capsule Take 1 capsule (100 mg total) by mouth 3 (three) times daily as needed for cough. 20 capsule 1  . celecoxib (CELEBREX) 100 MG capsule Take 1 capsule (100 mg total) by mouth 2 (two) times daily as needed. 60 capsule 1  . clopidogrel (PLAVIX) 75 MG tablet Take 75 mg by mouth daily.    . cyclobenzaprine (FLEXERIL) 5 MG tablet Take 1 tablet (5 mg total) by mouth 3 (three) times daily as  needed for muscle spasms (for disconfort after radiofrequency). 45 tablet 0  . diphenoxylate-atropine (LOMOTIL) 2.5-0.025 MG per tablet Take 2 tablets by mouth 4 (four) times daily as needed for diarrhea or loose stools. Reported on 03/11/2016    . donepezil (ARICEPT) 10 MG tablet Take 10 mg by mouth at bedtime.    . DULoxetine (CYMBALTA) 20 MG capsule Take 20 mg by mouth daily.  1  . estropipate (OGEN) 0.75 MG tablet Take 1 tablet (0.75 mg total) by mouth every other day. 30 tablet 3  . fentaNYL (DURAGESIC - DOSED MCG/HR) 50 MCG/HR Place 1 patch (50 mcg total) onto the skin every 3 (three) days. 10 patch 0  . [START ON 04/18/2017] fentaNYL (DURAGESIC - DOSED MCG/HR) 50 MCG/HR Place 1 patch (50 mcg total) onto the skin every 3 (three) days. 10 patch 0  . [START ON 05/18/2017] fentaNYL (DURAGESIC - DOSED MCG/HR) 50 MCG/HR Place 1 patch (50 mcg total) onto the skin every 3 (three) days. 10 patch 0  . fluticasone (FLONASE) 50 MCG/ACT nasal spray Place 2 sprays into both nostrils daily. 16 g 1  . furosemide (LASIX) 40 MG tablet     . LYRICA 300 MG capsule Take 300 mg by mouth 2 (two) times daily.  0  . pantoprazole (PROTONIX) 40 MG tablet take 1 tablet by mouth twice a day  60 tablet 6  . simvastatin (ZOCOR) 10 MG tablet Take 10 mg by mouth at bedtime. Reported on 12/04/2015    . SPIRIVA HANDIHALER 18 MCG inhalation capsule inhale the contents of one capsule in the handihaler once daily 30 capsule 12  . SUMAtriptan (IMITREX) 25 MG tablet Take 25 mg by mouth as needed for migraine. May repeat in 2 hours if headache persists or recurs.    . traMADol (ULTRAM) 50 MG tablet Take 2 tablets (100 mg total) by mouth every 6 (six) hours as needed for moderate pain or severe pain. 240 tablet 2  . triamcinolone ointment (KENALOG) 0.5 % Apply to lesions on hands twice a day as needed 30 g 1   No current facility-administered medications for this visit.     Functional Status:  In your present state of health, do you  have any difficulty performing the following activities: 04/15/2017 01/21/2017  Hearing? N N  Vision? N N  Difficulty concentrating or making decisions? N N  Walking or climbing stairs? Y Y  Dressing or bathing? Y Y  Doing errands, shopping? Malvin JohnsY Y  Preparing Food and eating ? N Y  Using the Toilet? N Y  In the past six months, have you accidently leaked urine? N Y  Do you have problems with loss of bowel control? N N  Managing your Medications? N Y  Managing your Finances? Malvin JohnsY Y  Housekeeping or managing your Housekeeping? N N  Some recent data might be hidden    Fall/Depression Screening: Fall Risk  04/15/2017 04/14/2017 02/05/2017  Falls in the past year? Yes Yes Yes  Number falls in past yr: 2 or more 1 1  Injury with Fall? Yes No No  Risk Factor Category  High Fall Risk - -  Risk for fall due to : History of fall(s);Impaired balance/gait;Impaired mobility Impaired balance/gait -  Risk for fall due to (comments): - - -  Follow up Falls evaluation completed;Education provided;Falls prevention discussed Falls prevention discussed -   PHQ 2/9 Scores 04/15/2017 04/14/2017 02/05/2017 01/10/2017 12/11/2016 12/05/2016 11/12/2016  PHQ - 2 Score 0 0 0 0 0 0 0   [facility] CM Care Plan Problem One     Most Recent Value  Care Plan Problem One  Knowledge deficit in self management of COPD  Role Documenting the Problem One  Health Coach  Care Plan for Problem One  Active  [facility] Long Term Goal (31-90 days)  Patient will not have any readmissions within the next 90 days.   [facility] Long Term Goal Start Date  04/15/17  Interventions for Problem One Long Term Goal  RN discussed medication adherence. Patient reminded to keep appointments with primary care physician and pulmonologist.   [facility] LPHN CM Short Term Goal #1 (0-30 days)  Patient will be able to verbalize the signs and symptoms of heat exhaustion  [facility] CM Short Term Goal #1 Start Date  04/15/17  Interventions for Short Term Goal #1  RN sent educational material on heat  exhaustion and hoe to keep the elderly hydrated.RN will follow up with next outreach call.   [facility] CM Short Term Goal #2 (0-30 days)  Patient will report no falls within the next 30 days.  [facility] CM Short Term Goal #2 Start Date  04/15/17  Interventions for Short Term Goal #2  RN discussed falls and fall prevention. RN wil send additional educational material on keeping the elderly safe.        Assessment:  Patient will benefit from  Health Coach telephonic outreach for education and support for COPD self management.  Plan:  RN discussed falls prevention RN sent educational material on Falls Prevention RN sent educational material on [employer] Exhaustion RN sent educational material on Elderly Dehydration RN will follow up outreach within the month of June  Gean Maidens BSN RN [facility] Care Management [phone]

## 2017-04-15 NOTE — Telephone Encounter (Signed)
Called patient. States not sure it worked- Upon further talking pt was numb until night time. Told her that she needs to wait for the steroid to start working. Instructed to call if needed/

## 2017-04-17 ENCOUNTER — Other Ambulatory Visit: Payer: Self-pay | Admitting: Family Medicine

## 2017-04-25 ENCOUNTER — Inpatient Hospital Stay: Payer: Medicare HMO | Admitting: Family Medicine

## 2017-04-25 ENCOUNTER — Ambulatory Visit: Payer: Medicare HMO

## 2017-04-26 ENCOUNTER — Other Ambulatory Visit: Payer: Self-pay | Admitting: Family Medicine

## 2017-04-27 NOTE — Telephone Encounter (Signed)
Please call in alprazolam.  

## 2017-04-28 NOTE — Telephone Encounter (Signed)
rx called in-aa 

## 2017-04-29 ENCOUNTER — Inpatient Hospital Stay
Admission: EM | Admit: 2017-04-29 | Discharge: 2017-05-02 | DRG: 871 | Disposition: A | Discharge status: Skilled Nursing Facility | Payer: Medicare HMO | Attending: Internal Medicine | Admitting: Internal Medicine

## 2017-04-29 ENCOUNTER — Emergency Department: Payer: Medicare HMO

## 2017-04-29 DIAGNOSIS — Z681 Body mass index (BMI) 19 or less, adult: Secondary | ICD-10-CM

## 2017-04-29 DIAGNOSIS — I739 Peripheral vascular disease, unspecified: Secondary | ICD-10-CM | POA: Diagnosis present

## 2017-04-29 DIAGNOSIS — J449 Chronic obstructive pulmonary disease, unspecified: Secondary | ICD-10-CM | POA: Diagnosis not present

## 2017-04-29 DIAGNOSIS — E43 Unspecified severe protein-calorie malnutrition: Secondary | ICD-10-CM | POA: Diagnosis not present

## 2017-04-29 DIAGNOSIS — A419 Sepsis, unspecified organism: Secondary | ICD-10-CM | POA: Diagnosis not present

## 2017-04-29 DIAGNOSIS — K219 Gastro-esophageal reflux disease without esophagitis: Secondary | ICD-10-CM | POA: Diagnosis present

## 2017-04-29 DIAGNOSIS — J189 Pneumonia, unspecified organism: Secondary | ICD-10-CM | POA: Diagnosis present

## 2017-04-29 DIAGNOSIS — F419 Anxiety disorder, unspecified: Secondary | ICD-10-CM | POA: Diagnosis present

## 2017-04-29 DIAGNOSIS — J440 Chronic obstructive pulmonary disease with acute lower respiratory infection: Secondary | ICD-10-CM | POA: Diagnosis present

## 2017-04-29 DIAGNOSIS — R4182 Altered mental status, unspecified: Secondary | ICD-10-CM

## 2017-04-29 DIAGNOSIS — Z515 Encounter for palliative care: Secondary | ICD-10-CM | POA: Diagnosis not present

## 2017-04-29 DIAGNOSIS — M1990 Unspecified osteoarthritis, unspecified site: Secondary | ICD-10-CM | POA: Diagnosis present

## 2017-04-29 DIAGNOSIS — G629 Polyneuropathy, unspecified: Secondary | ICD-10-CM | POA: Diagnosis present

## 2017-04-29 DIAGNOSIS — Z882 Allergy status to sulfonamides status: Secondary | ICD-10-CM

## 2017-04-29 DIAGNOSIS — M810 Age-related osteoporosis without current pathological fracture: Secondary | ICD-10-CM | POA: Diagnosis present

## 2017-04-29 DIAGNOSIS — G43909 Migraine, unspecified, not intractable, without status migrainosus: Secondary | ICD-10-CM | POA: Diagnosis present

## 2017-04-29 DIAGNOSIS — J441 Chronic obstructive pulmonary disease with (acute) exacerbation: Secondary | ICD-10-CM | POA: Diagnosis present

## 2017-04-29 DIAGNOSIS — R6520 Severe sepsis without septic shock: Secondary | ICD-10-CM | POA: Diagnosis present

## 2017-04-29 DIAGNOSIS — M549 Dorsalgia, unspecified: Secondary | ICD-10-CM | POA: Diagnosis not present

## 2017-04-29 DIAGNOSIS — E876 Hypokalemia: Secondary | ICD-10-CM | POA: Diagnosis not present

## 2017-04-29 DIAGNOSIS — Z9981 Dependence on supplemental oxygen: Secondary | ICD-10-CM | POA: Diagnosis not present

## 2017-04-29 DIAGNOSIS — Z79899 Other long term (current) drug therapy: Secondary | ICD-10-CM

## 2017-04-29 DIAGNOSIS — G9341 Metabolic encephalopathy: Secondary | ICD-10-CM | POA: Diagnosis not present

## 2017-04-29 DIAGNOSIS — I959 Hypotension, unspecified: Secondary | ICD-10-CM | POA: Diagnosis not present

## 2017-04-29 DIAGNOSIS — I10 Essential (primary) hypertension: Secondary | ICD-10-CM | POA: Diagnosis present

## 2017-04-29 DIAGNOSIS — J9621 Acute and chronic respiratory failure with hypoxia: Secondary | ICD-10-CM | POA: Diagnosis present

## 2017-04-29 DIAGNOSIS — I517 Cardiomegaly: Secondary | ICD-10-CM | POA: Diagnosis not present

## 2017-04-29 DIAGNOSIS — Z981 Arthrodesis status: Secondary | ICD-10-CM

## 2017-04-29 DIAGNOSIS — E162 Hypoglycemia, unspecified: Secondary | ICD-10-CM | POA: Diagnosis present

## 2017-04-29 DIAGNOSIS — G894 Chronic pain syndrome: Secondary | ICD-10-CM | POA: Diagnosis present

## 2017-04-29 DIAGNOSIS — F17210 Nicotine dependence, cigarettes, uncomplicated: Secondary | ICD-10-CM | POA: Diagnosis not present

## 2017-04-29 DIAGNOSIS — I2510 Atherosclerotic heart disease of native coronary artery without angina pectoris: Secondary | ICD-10-CM | POA: Diagnosis present

## 2017-04-29 DIAGNOSIS — E785 Hyperlipidemia, unspecified: Secondary | ICD-10-CM | POA: Diagnosis present

## 2017-04-29 DIAGNOSIS — Z86718 Personal history of other venous thrombosis and embolism: Secondary | ICD-10-CM

## 2017-04-29 DIAGNOSIS — Z79891 Long term (current) use of opiate analgesic: Secondary | ICD-10-CM

## 2017-04-29 DIAGNOSIS — Z885 Allergy status to narcotic agent status: Secondary | ICD-10-CM

## 2017-04-29 DIAGNOSIS — Z7902 Long term (current) use of antithrombotics/antiplatelets: Secondary | ICD-10-CM

## 2017-04-29 LAB — COMPREHENSIVE METABOLIC PANEL
ALBUMIN: 4.4 g/dL (ref 3.5–5.0)
ALT: 12 U/L — AB (ref 14–54)
AST: 21 U/L (ref 15–41)
Alkaline Phosphatase: 115 U/L (ref 38–126)
Anion gap: 11 (ref 5–15)
BUN: 19 mg/dL (ref 6–20)
CHLORIDE: 96 mmol/L — AB (ref 101–111)
CO2: 31 mmol/L (ref 22–32)
CREATININE: 1.04 mg/dL — AB (ref 0.44–1.00)
Calcium: 9.1 mg/dL (ref 8.9–10.3)
GFR calc Af Amer: >60 mL/min (ref >60)
GFR, EST NON AFRICAN AMERICAN: 54 mL/min — AB (ref >60)
Glucose, Bld: 89 mg/dL (ref 65–99)
POTASSIUM: 3.1 mmol/L — AB (ref 3.5–5.1)
SODIUM: 138 mmol/L (ref 135–145)
Total Bilirubin: 0.6 mg/dL (ref 0.3–1.2)
Total Protein: 8.2 g/dL — ABNORMAL HIGH (ref 6.5–8.1)

## 2017-04-29 LAB — URINALYSIS, COMPLETE (UACMP) WITH MICROSCOPIC
Bilirubin Urine: NEGATIVE
Glucose, UA: NEGATIVE mg/dL
HGB URINE DIPSTICK: NEGATIVE
Ketones, ur: NEGATIVE mg/dL
Leukocytes, UA: NEGATIVE
Nitrite: NEGATIVE
PROTEIN: NEGATIVE mg/dL
RBC / HPF: NONE SEEN RBC/hpf (ref 0–5)
SPECIFIC GRAVITY, URINE: 1.005 (ref 1.005–1.030)
WBC UA: NONE SEEN WBC/hpf (ref 0–5)
pH: 7 (ref 5.0–8.0)

## 2017-04-29 LAB — CBC
HCT: 32 % — ABNORMAL LOW (ref 35.0–47.0)
HEMOGLOBIN: 10.9 g/dL — AB (ref 12.0–16.0)
MCH: 28.3 pg (ref 26.0–34.0)
MCHC: 34.1 g/dL (ref 32.0–36.0)
MCV: 83 fL (ref 80.0–100.0)
PLATELETS: 221 10*3/uL (ref 150–440)
RBC: 3.85 MIL/uL (ref 3.80–5.20)
RDW: 17.8 % — ABNORMAL HIGH (ref 11.5–14.5)
WBC: 12.3 10*3/uL — AB (ref 3.6–11.0)

## 2017-04-29 LAB — TROPONIN I

## 2017-04-29 LAB — GLUCOSE, CAPILLARY
GLUCOSE-CAPILLARY: 56 mg/dL — AB (ref 65–99)
GLUCOSE-CAPILLARY: 166 mg/dL — AB (ref 65–99)
GLUCOSE-CAPILLARY: 81 mg/dL (ref 65–99)
Glucose-Capillary: 90 mg/dL (ref 65–99)

## 2017-04-29 LAB — MAGNESIUM: MAGNESIUM: 1.9 mg/dL (ref 1.7–2.4)

## 2017-04-29 LAB — LACTIC ACID, PLASMA: LACTIC ACID, VENOUS: 1.3 mmol/L (ref 0.5–1.9)

## 2017-04-29 MED ORDER — DEXTROSE 5 % IV SOLN
500.0000 mg | INTRAVENOUS | Status: DC
  Administered 2017-04-30: 09:00:00 500 mg via INTRAVENOUS
  Filled 2017-04-29: qty 500

## 2017-04-29 MED ORDER — SODIUM CHLORIDE 0.9 % IV BOLUS (SEPSIS)
1000.0000 mL | Freq: Once | INTRAVENOUS | Status: AC
  Administered 2017-04-29: 1000 mL via INTRAVENOUS

## 2017-04-29 MED ORDER — DEXTROSE-NACL 5-0.9 % IV SOLN: INTRAVENOUS | Status: DC

## 2017-04-29 MED ORDER — ACETAMINOPHEN 325 MG PO TABS: 650.0000 mg | ORAL_TABLET | Freq: Four times a day (QID) | ORAL | Status: DC | PRN

## 2017-04-29 MED ORDER — HYDROCODONE-ACETAMINOPHEN 5-325 MG PO TABS: 1.0000 | ORAL_TABLET | ORAL | Status: DC | PRN

## 2017-04-29 MED ORDER — DEXTROSE 50 % IV SOLN
25.0000 mL | Freq: Once | INTRAVENOUS | Status: AC
  Administered 2017-04-29: 25 mL via INTRAVENOUS

## 2017-04-29 MED ORDER — ACETAMINOPHEN 500 MG PO TABS: 1000.0000 mg | ORAL_TABLET | Freq: Once | ORAL | Status: DC

## 2017-04-29 MED ORDER — CLOPIDOGREL BISULFATE 75 MG PO TABS
75.0000 mg | ORAL_TABLET | Freq: Every day | ORAL | Status: DC
  Administered 2017-04-29: 75 mg via ORAL
  Filled 2017-04-29: qty 1

## 2017-04-29 MED ORDER — MOMETASONE FURO-FORMOTEROL FUM 200-5 MCG/ACT IN AERO
2.0000 | INHALATION_SPRAY | Freq: Two times a day (BID) | RESPIRATORY_TRACT | Status: DC
  Administered 2017-04-29 – 2017-05-02 (×5): 2 via RESPIRATORY_TRACT
  Filled 2017-04-29: qty 8.8

## 2017-04-29 MED ORDER — ALPRAZOLAM 0.5 MG PO TABS: 0.5000 mg | ORAL_TABLET | Freq: Three times a day (TID) | ORAL | Status: DC | PRN

## 2017-04-29 MED ORDER — CEFTRIAXONE SODIUM IN DEXTROSE 20 MG/ML IV SOLN: 1.0000 g | INTRAVENOUS | Status: DC

## 2017-04-29 MED ORDER — TRAZODONE HCL 50 MG PO TABS
25.0000 mg | ORAL_TABLET | Freq: Every evening | ORAL | Status: DC | PRN
  Filled 2017-04-29: qty 1

## 2017-04-29 MED ORDER — DEXTROSE 5 % IV SOLN
500.0000 mg | Freq: Once | INTRAVENOUS | Status: AC
  Administered 2017-04-29: 500 mg via INTRAVENOUS
  Filled 2017-04-29: qty 500

## 2017-04-29 MED ORDER — ACETAMINOPHEN 650 MG RE SUPP
650.0000 mg | Freq: Four times a day (QID) | RECTAL | Status: DC | PRN
  Administered 2017-04-30: 18:00:00 650 mg via RECTAL
  Filled 2017-04-29 (×2): qty 1

## 2017-04-29 MED ORDER — ORAL CARE MOUTH RINSE
15.0000 mL | Freq: Two times a day (BID) | OROMUCOSAL | Status: DC
  Administered 2017-05-01 (×2): 15 mL via OROMUCOSAL

## 2017-04-29 MED ORDER — BENZONATATE 100 MG PO CAPS: 100.0000 mg | ORAL_CAPSULE | Freq: Three times a day (TID) | ORAL | Status: DC | PRN

## 2017-04-29 MED ORDER — PANTOPRAZOLE SODIUM 40 MG PO TBEC
40.0000 mg | DELAYED_RELEASE_TABLET | Freq: Two times a day (BID) | ORAL | Status: DC
  Administered 2017-04-29 – 2017-05-02 (×7): 40 mg via ORAL
  Filled 2017-04-29 (×7): qty 1

## 2017-04-29 MED ORDER — ACETAMINOPHEN 10 MG/ML IV SOLN
1000.0000 mg | Freq: Once | INTRAVENOUS | Status: AC
  Administered 2017-04-29: 1000 mg via INTRAVENOUS
  Filled 2017-04-29: qty 100

## 2017-04-29 MED ORDER — ESTROPIPATE 1.5 MG PO TABS
0.7500 mg | ORAL_TABLET | ORAL | Status: DC
  Administered 2017-04-30 – 2017-05-02 (×2): 0.75 mg via ORAL
  Filled 2017-04-29: qty 1
  Filled 2017-04-29: qty 0.5
  Filled 2017-04-29: qty 1

## 2017-04-29 MED ORDER — CEFTRIAXONE SODIUM IN DEXTROSE 20 MG/ML IV SOLN
1.0000 g | Freq: Once | INTRAVENOUS | Status: AC
  Administered 2017-04-29: 1 g via INTRAVENOUS

## 2017-04-29 MED ORDER — CYCLOBENZAPRINE HCL 10 MG PO TABS
5.0000 mg | ORAL_TABLET | Freq: Three times a day (TID) | ORAL | Status: DC | PRN
  Administered 2017-04-29 – 2017-04-30 (×2): 5 mg via ORAL
  Filled 2017-04-29 (×2): qty 1

## 2017-04-29 MED ORDER — ONDANSETRON HCL 4 MG PO TABS: 4.0000 mg | ORAL_TABLET | Freq: Four times a day (QID) | ORAL | Status: DC | PRN

## 2017-04-29 MED ORDER — POTASSIUM CHLORIDE IN NACL 40-0.9 MEQ/L-% IV SOLN
INTRAVENOUS | Status: DC
  Administered 2017-04-29 – 2017-05-01 (×4): 75 mL/h via INTRAVENOUS
  Filled 2017-04-29 (×7): qty 1000

## 2017-04-29 MED ORDER — HEPARIN SODIUM (PORCINE) 5000 UNIT/ML IJ SOLN: 5000.0000 [IU] | Freq: Three times a day (TID) | INTRAMUSCULAR | Status: DC

## 2017-04-29 MED ORDER — FLUTICASONE PROPIONATE 50 MCG/ACT NA SUSP
2.0000 | Freq: Every day | NASAL | Status: DC
  Administered 2017-04-29 – 2017-05-02 (×4): 2 via NASAL
  Filled 2017-04-29: qty 16

## 2017-04-29 MED ORDER — DEXTROSE 5 % IV SOLN
INTRAVENOUS | Status: AC
  Filled 2017-04-29: qty 10

## 2017-04-29 MED ORDER — DONEPEZIL HCL 5 MG PO TABS
10.0000 mg | ORAL_TABLET | Freq: Every day | ORAL | Status: DC
  Administered 2017-04-29 – 2017-05-01 (×2): 10 mg via ORAL
  Filled 2017-04-29 (×2): qty 2

## 2017-04-29 MED ORDER — TIOTROPIUM BROMIDE MONOHYDRATE 18 MCG IN CAPS
18.0000 ug | ORAL_CAPSULE | Freq: Every day | RESPIRATORY_TRACT | Status: DC
  Administered 2017-04-29 – 2017-05-02 (×3): 18 ug via RESPIRATORY_TRACT
  Filled 2017-04-29: qty 5

## 2017-04-29 MED ORDER — PREGABALIN 75 MG PO CAPS
300.0000 mg | ORAL_CAPSULE | Freq: Two times a day (BID) | ORAL | Status: DC
  Administered 2017-04-29 – 2017-05-02 (×6): 300 mg via ORAL
  Filled 2017-04-29 (×6): qty 4

## 2017-04-29 MED ORDER — DEXTROSE 5 % IV SOLN
1.0000 g | INTRAVENOUS | Status: DC
  Administered 2017-04-30 – 2017-05-02 (×3): 1 g via INTRAVENOUS
  Filled 2017-04-29 (×4): qty 10

## 2017-04-29 MED ORDER — TRAMADOL HCL 50 MG PO TABS
100.0000 mg | ORAL_TABLET | Freq: Four times a day (QID) | ORAL | Status: DC | PRN
  Administered 2017-04-29 – 2017-05-02 (×5): 100 mg via ORAL
  Filled 2017-04-29 (×5): qty 2

## 2017-04-29 MED ORDER — DEXTROSE 50 % IV SOLN
INTRAVENOUS | Status: AC
  Filled 2017-04-29: qty 50

## 2017-04-29 MED ORDER — FENTANYL 50 MCG/HR TD PT72
50.0000 ug | MEDICATED_PATCH | TRANSDERMAL | Status: DC
  Administered 2017-05-02: 08:00:00 50 ug via TRANSDERMAL
  Filled 2017-04-29: qty 1

## 2017-04-29 MED ORDER — DOCUSATE SODIUM 100 MG PO CAPS
100.0000 mg | ORAL_CAPSULE | Freq: Two times a day (BID) | ORAL | Status: DC
  Administered 2017-04-29 – 2017-05-02 (×6): 100 mg via ORAL
  Filled 2017-04-29 (×6): qty 1

## 2017-04-29 MED ORDER — SODIUM CHLORIDE 0.9 % IV BOLUS (SEPSIS)
266.0000 mL | Freq: Once | INTRAVENOUS | Status: AC
  Administered 2017-04-29: 266 mL via INTRAVENOUS

## 2017-04-29 MED ORDER — ALBUTEROL SULFATE (2.5 MG/3ML) 0.083% IN NEBU: 2.5000 mg | INHALATION_SOLUTION | RESPIRATORY_TRACT | Status: DC | PRN

## 2017-04-29 MED ORDER — ALBUTEROL SULFATE HFA 108 (90 BASE) MCG/ACT IN AERS: 2.0000 | INHALATION_SPRAY | RESPIRATORY_TRACT | Status: DC | PRN

## 2017-04-29 MED ORDER — ONDANSETRON HCL 4 MG/2ML IJ SOLN
4.0000 mg | Freq: Four times a day (QID) | INTRAMUSCULAR | Status: DC | PRN
  Administered 2017-05-01: 4 mg via INTRAVENOUS
  Filled 2017-04-29: qty 2

## 2017-04-29 MED ORDER — BISACODYL 5 MG PO TBEC
5.0000 mg | DELAYED_RELEASE_TABLET | Freq: Every day | ORAL | Status: DC | PRN
  Filled 2017-04-29: qty 1

## 2017-04-29 MED ORDER — MORPHINE SULFATE (PF) 2 MG/ML IV SOLN: 2.0000 mg | INTRAVENOUS | Status: DC | PRN

## 2017-04-29 MED ORDER — FUROSEMIDE 40 MG PO TABS: 40.0000 mg | ORAL_TABLET | Freq: Every day | ORAL | Status: DC

## 2017-04-29 NOTE — ED Notes (Signed)
Spoke with admitting doctor about floors concerns with plavix and heparin and the fact patient is to have a procedure that has her to hold these medications and the double order for scds. Admitting doctor will address this. Spoke with RN Thayer Ohmhris.

## 2017-04-29 NOTE — ED Triage Notes (Signed)
She arrives today via [facility] from home where she lives alone  Found by her sister - pt with decreased LOC  Reported that she placed her fentanyl patch on her lower back this am and then took her tramadol prior to eating  Pt lethargic upon arrival  +fever

## 2017-04-29 NOTE — ED Notes (Signed)
Patient up to bedside commode to use the restroom.  Will continue to monitor.

## 2017-04-29 NOTE — ED Notes (Signed)
Patient placed back on 2L of oxygen by Nasal Cannula.

## 2017-04-29 NOTE — H&P (Signed)
[facility] [place] at [facility]   PATIENT NAME: Cheryl [name]    MR#:  [id number]  DATE OF BIRTH:  1951-06-14  DATE OF ADMISSION:  04/29/2017  PRIMARY CARE PHYSICIAN: Malva Limes, MD   REQUESTING/REFERRING PHYSICIAN: Dr. Darden Dates  CHIEF COMPLAINT: Altered mental status    Chief Complaint  Patient presents with  . Weakness  . Loss of Consciousness    HISTORY OF PRESENT ILLNESS:  Cheryl [name]  is a 65 y.o. female with a known history of Chronic respiratory failure and on oxygen 2 L secondary to severe COPD, chronic back pain followed by pain management clinic and on multiple pain medicines, brought in because of altered mental status. Blood sugar found to be 56 when she came. Temp was 103 Fahrenheit, she also had vomiting at home, chest x-ray showed side pneumonia. Patient received dextrose containing IV fluids, IV Rocephin, Zithromax, IV tenderness or fever. When she came she was lethargic but by the time I  Went to  Take H&P she is completely alert awake and oriented and asking for pain medicines and leg pain. I reviewed her pain management doctor note and she had lumbar facet radiofrequency ablation on May 21 and she is advised to stay away from blood thinners 1 week after surgery.  PAST MEDICAL HISTORY:   Past Medical History:  Diagnosis Date  . Allergy   . Anxiety   . Arthritis   . Aspiration pneumonitis (HCC) 11/24/2015  . Asthma   . Chronic pain   . COPD (chronic obstructive pulmonary disease) (HCC)   . Coronary artery disease   . DVT (deep venous thrombosis) (HCC)   . GERD (gastroesophageal reflux disease)   . Headache   . Hyperlipidemia   . Hypertension   . Migraines   . Neuropathy 2010  . Osteoporosis   . Oxygen deficiency   . Peripheral vascular disease (HCC)   . Pneumonia   . Pneumonia 11/19/2015  . Pneumonia 10/2016  . Vitamin D deficiency     PAST SURGICAL HISTOIRY:   Past Surgical History:  Procedure Laterality Date  .  ABDOMINAL HYSTERECTOMY  1975   Bilaterl Oophorectomy; [place] to IUD infection  . abdomnal aortic stent  05/30/2008   Dr. Nanetta Batty  . APPENDECTOMY    . APPENDECTOMY    . cardiac catherization  10/31/2009  . CERVICAL FUSION  C5 - 6/C6-7  . CHOLECYSTECTOMY  1972  . COLONOSCOPY WITH PROPOFOL N/A 07/27/2015   Procedure: COLONOSCOPY WITH PROPOFOL;  Surgeon: Wallace Cullens, MD;  Location: [facility] ENDOSCOPY;  Service: Gastroenterology;  Laterality: N/A;  . ESOPHAGOGASTRODUODENOSCOPY (EGD) WITH PROPOFOL N/A 07/27/2015   Procedure: ESOPHAGOGASTRODUODENOSCOPY (EGD) WITH PROPOFOL;  Surgeon: Wallace Cullens, MD;  Location: [facility] ENDOSCOPY;  Service: Gastroenterology;  Laterality: N/A;  . FOOT SURGERY Bilateral    5-6 years per patient  . SPINE SURGERY      SOCIAL HISTORY:   Social History  Substance Use Topics  . Smoking status: Current Every Day Smoker    Packs/day: 1.00    Years: 50.00    Types: Cigarettes  . Smokeless tobacco: Never Used     Comment: Previously smoked 2 ppd  . Alcohol use No    FAMILY HISTORY:   Family History  Problem Relation Age of Onset  . Cancer Mother   . Arthritis Mother   . Heart disease Mother   . Diabetes Mother        mellitus, type 2  . Heart disease  Father   . Diabetes Sister   . Cancer Brother   . Cancer Brother        lung  . Diabetes Brother     DRUG ALLERGIES:   Allergies  Allergen Reactions  . Percocet [Oxycodone-Acetaminophen] Hives and Rash  . Aspirin Nausea And Vomiting and Other (See Comments)    Reaction:  GI upset   . Codeine Nausea And Vomiting and Other (See Comments)    Reaction:  GI upset   . Propoxyphene Other (See Comments) and Nausea Only    GI upset Reaction:  GI upset   . Sulfa Antibiotics Rash and Other (See Comments)    Reaction:  GI upset     REVIEW OF SYSTEMS:  CONSTITUTIONAL:I fever and altered mental status and she came.  EARS, NOSE, AND THROAT: No tinnitus or ear pain.  RESPIRATORY: No cough, shortness of breath,  wheezing or hemoptysis.  CARDIOVASCULAR: No chest pain, orthopnea, edema.  GASTROINTESTINAL: No nausea, vomiting, diarrhea or abdominal pain.  GENITOURINARY: No dysuria, hematuria.  ENDOCRINE: No polyuria, nocturia,  HEMATOLOGY: No anemia, easy bruising or bleeding SKIN: No rash or lesion. MUSCULOSKELETAL: No joint pain or arthritis.   NEUROLOGIC: No tingling, numbness, weakness.  PSYCHIATRY: No anxiety or depression.   MEDICATIONS AT HOME:   Prior to Admission medications   Medication Sig Start Date End Date Taking? Authorizing Provider  acetaminophen (TYLENOL) 325 MG tablet Take 2 tablets (650 mg total) by mouth every 6 (six) hours as needed for mild pain (or Fever >/= 101). 12/25/16  Yes Enid Baas, MD  ADVAIR DISKUS 250-50 MCG/DOSE AEPB inhale 1 dose by mouth twice a day 08/15/16  Yes Fisher, Demetrios Isaacs, MD  albuterol (PROVENTIL HFA;VENTOLIN HFA) 108 (90 BASE) MCG/ACT inhaler Inhale 2 puffs into the lungs every 4 (four) hours as needed for wheezing or shortness of breath. 08/11/15  Yes Malva Limes, MD  albuterol (PROVENTIL) (2.5 MG/3ML) 0.083% nebulizer solution Take 3 mLs (2.5 mg total) by nebulization every 4 (four) hours as needed for wheezing. 10/12/15  Yes Katharina Caper, MD  ALPRAZolam Prudy Feeler) 0.5 MG tablet take 1 tablet by mouth every 6 hours if needed 04/27/17  Yes Fisher, Demetrios Isaacs, MD  celecoxib (CELEBREX) 100 MG capsule Take 1 capsule (100 mg total) by mouth 2 (two) times daily as needed. Patient taking differently: Take 200 mg by mouth daily as needed.  10/24/16  Yes Malva Limes, MD  clopidogrel (PLAVIX) 75 MG tablet Take 75 mg by mouth daily.   Yes [provider]  cyclobenzaprine (FLEXERIL) 5 MG tablet Take 1 tablet (5 mg total) by mouth 3 (three) times daily as needed for muscle spasms (for disconfort after radiofrequency). 04/14/17 04/29/17 Yes Delano Metz, MD  diphenoxylate-atropine (LOMOTIL) 2.5-0.025 MG per tablet Take 2 tablets by mouth 4 (four)  times daily as needed for diarrhea or loose stools. Reported on 03/11/2016   Yes [provider]  donepezil (ARICEPT) 10 MG tablet Take 10 mg by mouth at bedtime.   Yes [provider]  estropipate (OGEN) 0.75 MG tablet Take 1 tablet (0.75 mg total) by mouth every other day. Patient taking differently: Take 0.75 mg by mouth daily.  12/17/16  Yes Malva Limes, MD  fentaNYL (DURAGESIC - DOSED MCG/HR) 50 MCG/HR Place 1 patch (50 mcg total) onto the skin every 3 (three) days. 04/18/17 05/18/17 Yes Delano Metz, MD  furosemide (LASIX) 40 MG tablet Take 40 mg by mouth daily.  02/16/17  Yes [provider]  LYRICA 300 MG capsule Take 300 mg by mouth 2 (two) times daily. 04/12/17  Yes [provider]  pantoprazole (PROTONIX) 40 MG tablet take 1 tablet by mouth twice a day Patient taking differently: Take 40 mg by mouth once daily 04/17/17  Yes Fisher, Demetrios Isaacsonald E, MD  simvastatin (ZOCOR) 10 MG tablet Take 10 mg by mouth at bedtime. Reported on 12/04/2015   Yes [provider]  SPIRIVA HANDIHALER 18 MCG inhalation capsule inhale the contents of one capsule in the handihaler once daily 11/16/16  Yes Fisher, Demetrios Isaacsonald E, MD  SUMAtriptan (IMITREX) 25 MG tablet Take 25 mg by mouth as needed for migraine. May repeat in 2 hours if headache persists or recurs.   Yes [provider]  traMADol (ULTRAM) 50 MG tablet Take 2 tablets (100 mg total) by mouth every 6 (six) hours as needed for moderate pain or severe pain. 03/19/17 06/17/17 Yes Delano MetzNaveira, Francisco, MD  benzonatate (TESSALON PERLES) 100 MG capsule Take 1 capsule (100 mg total) by mouth 3 (three) times daily as needed for cough. Patient not taking: Reported on 04/29/2017 01/28/17   Malva LimesFisher, Donald E, MD  fentaNYL (DURAGESIC - DOSED MCG/HR) 50 MCG/HR Place 1 patch (50 mcg total) onto the skin every 3 (three) days. 03/19/17 04/18/17  Delano MetzNaveira, Francisco, MD  fentaNYL (DURAGESIC - DOSED MCG/HR) 50 MCG/HR Place 1 patch (50 mcg  total) onto the skin every 3 (three) days. Patient not taking: Reported on 04/29/2017 05/18/17 06/17/17  Delano MetzNaveira, Francisco, MD  fluticasone [facility](FLONASE) 50 MCG/ACT nasal spray Place 2 sprays into both nostrils daily. Patient not taking: Reported on 04/29/2017 04/09/16   Malva LimesFisher, Donald E, MD  triamcinolone ointment (KENALOG) 0.5 % Apply to lesions on hands twice a day as needed Patient not taking: Reported on 04/29/2017 10/24/16   Malva LimesFisher, Donald E, MD      VITAL SIGNS:  Blood pressure (!) 95/48, pulse 70, temperature 98.4 F (36.9 C), temperature source Oral, resp. rate 10, height 4\' 10"  (1.473 m), weight 42.2 kg (93 lb), SpO2 93 %.  PHYSICAL EXAMINATION:  GENERAL:  66 y.o.-year-old patient lying in the bed with no acute distress.  EYES: Pupils equal, round, reactive to light  No scleral icterus. Extraocular muscles intact.  HEENT: Head atraumatic, normocephalic. Oropharynx and nasopharynx clear.  NECK:  Supple, no jugular venous distention. No thyroid enlargement, no tenderness.  LUNGS: Normal breath sounds bilaterally, no wheezing, rales,rhonchi or crepitation. No use of accessory muscles of respiration.  CARDIOVASCULAR: S1, S2 normal. No murmurs, rubs, or gallops.  ABDOMEN: Soft, nontender, nondistended. Bowel sounds present. No organomegaly or mass.  EXTREMITIES: No pedal edema, cyanosis, or clubbing.  NEUROLOGIC: Cranial nerves II through XII are intact. Muscle strength 5/5 in all extremities. Sensation intact. Gait not checked.  PSYCHIATRIC: The patient is alert and oriented x 3.  SKIN: No obvious rash, lesion, or ulcer.   LABORATORY PANEL:   CBC  Recent Labs Lab 04/29/17 1053  WBC 12.3*  HGB 10.9*  HCT 32.0*  PLT 221   ------------------------------------------------------------------------------------------------------------------  Chemistries   Recent Labs Lab 04/29/17 1053  NA 138  K 3.1*  CL 96*  CO2 31  GLUCOSE 89  BUN 19  CREATININE 1.04*  CALCIUM 9.1  AST 21   ALT 12*  ALKPHOS 115  BILITOT 0.6   ------------------------------------------------------------------------------------------------------------------  Cardiac Enzymes  Recent Labs Lab 04/29/17 1053  TROPONINI <0.03   ------------------------------------------------------------------------------------------------------------------  RADIOLOGY:  Dg Chest Port 1 View  Result Date: 04/29/2017 CLINICAL DATA:  COPD. EXAM:  PORTABLE CHEST 1 VIEW COMPARISON:  01/28/2017 . 01/05/2017. 11/05/2016. 01/22/2015 CT 06/03/2016. FINDINGS: Mediastinum and hilar structures normal. Persistent left base pleuroparenchymal thickening noted consistent with scarring. Possible new infiltrate left perihilar region. Overlying EKG leads make evaluation difficult. No pleural effusion or pneumothorax . Heart size stable. IMPRESSION: 1. Questionable new mild left perihilar infiltrate. Overlying EKG leads make evaluation difficult. 2. Persistent left base pleural-parenchymal thickening consistent with scarring. Similar findings noted on multiple prior exams. 3. Stable cardiomegaly . Electronically Signed   By: Maisie Fus  Register   On: 04/29/2017 10:59    EKG:   Orders placed or performed during the hospital encounter of 04/29/17  . ED EKG  . ED EKG  . EKG 12-Lead  . EKG 12-Lead    IMPRESSION AND PLAN:   #1 sepsis secondary to a minute acquired pneumonia: Continue aggressive hydration, IV antibiotics, follow blood cultures, follow white count. #2 COPD with chronic respiratory failure, follows up with Dr., Nicholos Johns patient still smokes and uses 2 L of oxygen at home. #3/hypokalemia: Replace her potassium. #4 essential hypertension: Controlled GERD continue PPIs 4. Chronic back pain issues: Follows up with pain management, recently had facet ablation, continue her home dose pain medicines, please don't give any pain meds at discharge as she has a  contractwith pain management. #5/and altered mental status with  metabolic encephalopathy due to sepsis and hypoglycemia:. Altered mental status also likely coming from hypoglycemia, multiple narcotics and sedatives. D/w daughter  All the records are reviewed and case discussed with ED provider. Management plans discussed with the patient, family and they are in agreement.  CODE STATUS: full  TOTAL TIME TAKING CARE OF THIS PATIENT: .    Katha Hamming M.D on 04/29/2017 at 3:52 PM  Between 7am to 6pm - Pager - [phone]  After 6pm go to [email or web address] - password [email or web address]  [facility]  Office  [phone]  CC: Primary care physician; Malva Limes, MD  Note: This dictation was prepared with Dragon dictation along with smaller phrase technology. Any transcriptional errors that result from this process are unintentional.

## 2017-04-29 NOTE — ED Provider Notes (Signed)
[facility] Emergency Department Provider Note  ____________________________________________  Time seen: Approximately 11:25 AM  I have reviewed the triage vital signs and the nursing notes.   HISTORY  Chief Complaint Weakness and Loss of Consciousness  Level 5 caveat:  Portions of the history and physical were unable to be obtained due to dementia and ams   HPI Cheryl [name] is a 66 y.o. female with h/o COPD, aspiration pneumonia. CAD, DVT, PVD, HTN, HLD, dementia who presents for evaluation of altered mental status. According to patient's daughter and sister who were at the bedside patient was in her usual state of health yesterday in the afternoon when the last saw her. This morning when the sister went to check on her patient was sitting in the commode very confused and disoriented, she was very weak and lethargic. She had 1 episode of nonbloody nonbilious emesis and EMS was called. Patient is on oxygen at night only. Until yesterday patient had no fever or chills, no cough or congestion, no diarrhea or abdominal pain. Patient at this time is only complaining of cramping in her lower extremities. Patient denies shortness of breath or chest pain, she denies headache, she denies abdominal pain, she denies dysuria. Patient is very confused and thinks she is at home.  Past Medical History:  Diagnosis Date  . Allergy   . Anxiety   . Arthritis   . Aspiration pneumonitis (HCC) 11/24/2015  . Asthma   . Chronic pain   . COPD (chronic obstructive pulmonary disease) (HCC)   . Coronary artery disease   . DVT (deep venous thrombosis) (HCC)   . GERD (gastroesophageal reflux disease)   . Headache   . Hyperlipidemia   . Hypertension   . Migraines   . Neuropathy 2010  . Osteoporosis   . Oxygen deficiency   . Peripheral vascular disease (HCC)   . Pneumonia   . Pneumonia 11/19/2015  . Pneumonia 10/2016  . Vitamin D deficiency     Patient Active Problem  List   Diagnosis Date Noted  . Acute postoperative pain 04/14/2017  . Metabolic encephalopathy 12/23/2016  . Hypokalemia 12/23/2016  . Protein-calorie malnutrition, severe 11/06/2016  . Nausea with vomiting   . Lumbar facet syndrome (Location of Primary Source of Pain) (Bilateral) (L>R) 03/20/2016  . Lumbar spondylosis 03/20/2016  . Opiate use (160 MME/Day) 02/28/2016  . Anemia 02/28/2016  . Osteoarthrosis 12/18/2015  . Long term current use of anticoagulant therapy (Plavix) 12/18/2015  . Long term current use of opiate analgesic 12/04/2015  . Fibromyalgia 12/04/2015  . Dysphagia 11/24/2015  . Acute diastolic CHF (congestive heart failure) (HCC) 11/24/2015  . Leukocytosis 11/24/2015  . Sepsis (HCC) 10/17/2015  . Abnormal mammogram of right breast 10/11/2015  . Dilated intrahepatic bile duct 10/11/2015  . Lumbar radicular pain (Bilateral) (L>R) (L4) 09/04/2015  . Chronic low back pain (Location of Primary Source of Pain) (Bilateral) (L>R) 09/04/2015  . Encounter for therapeutic drug level monitoring 09/04/2015  . Uncomplicated opioid dependence (HCC) 09/04/2015  . Chronic pain syndrome 09/04/2015  . Platelet inhibition due to Plavix 09/04/2015  . COPD (chronic obstructive pulmonary disease) (HCC) 07/31/2015  . Dementia 07/31/2015  . Airway hyperreactivity 07/03/2015  . Back pain, thoracic 07/03/2015  . Excessive falling 07/03/2015  . Alteration in bowel elimination: incontinence 07/03/2015  . Insomnia 07/03/2015  . Decreased testosterone level 07/03/2015  . Leg weakness 07/03/2015  . Menopausal symptom 07/03/2015  . Migraine 07/03/2015  . Neuropathy (HCC) 07/03/2015  . Fecal  occult blood test positive 07/03/2015  . OP (osteoporosis) 07/03/2015  . Panic disorder 07/03/2015  . Compulsive tobacco user syndrome 07/03/2015  . Urinary incontinence 07/03/2015  . Weight loss 07/03/2015  . Essential hypertension 06/06/2015  . GERD (gastroesophageal reflux disease) 06/06/2015  .  Hyperlipemia 06/06/2015  . Peripheral nerve disease 04/13/2014  . Anxiety 02/10/2014  . Coronary artery disease 02/10/2014  . Hypercholesteremia 02/10/2014  . Peripheral vascular disease (HCC) 02/10/2014  . Vitamin D deficiency 10/16/2009    Past Surgical History:  Procedure Laterality Date  . ABDOMINAL HYSTERECTOMY  1975   Bilaterl Oophorectomy; Dur to IUD infection  . abdomnal aortic stent  05/30/2008   Dr. Nanetta Batty  . APPENDECTOMY    . APPENDECTOMY    . cardiac catherization  10/31/2009  . CERVICAL FUSION  C5 - 6/C6-7  . CHOLECYSTECTOMY  1972  . COLONOSCOPY WITH PROPOFOL N/A 07/27/2015   Procedure: COLONOSCOPY WITH PROPOFOL;  Surgeon: Wallace Cullens, MD;  Location: [facility] ENDOSCOPY;  Service: Gastroenterology;  Laterality: N/A;  . ESOPHAGOGASTRODUODENOSCOPY (EGD) WITH PROPOFOL N/A 07/27/2015   Procedure: ESOPHAGOGASTRODUODENOSCOPY (EGD) WITH PROPOFOL;  Surgeon: Wallace Cullens, MD;  Location: [facility] ENDOSCOPY;  Service: Gastroenterology;  Laterality: N/A;  . FOOT SURGERY Bilateral    5-6 years per patient  . SPINE SURGERY      Prior to Admission medications   Medication Sig Start Date End Date Taking? Authorizing Provider  acetaminophen (TYLENOL) 325 MG tablet Take 2 tablets (650 mg total) by mouth every 6 (six) hours as needed for mild pain (or Fever >/= 101). 12/25/16  Yes Enid Baas, MD  ADVAIR DISKUS 250-50 MCG/DOSE AEPB inhale 1 dose by mouth twice a day 08/15/16  Yes Fisher, Demetrios Isaacs, MD  albuterol (PROVENTIL HFA;VENTOLIN HFA) 108 (90 BASE) MCG/ACT inhaler Inhale 2 puffs into the lungs every 4 (four) hours as needed for wheezing or shortness of breath. 08/11/15  Yes Malva Limes, MD  albuterol (PROVENTIL) (2.5 MG/3ML) 0.083% nebulizer solution Take 3 mLs (2.5 mg total) by nebulization every 4 (four) hours as needed for wheezing. 10/12/15  Yes Katharina Caper, MD  ALPRAZolam Prudy Feeler) 0.5 MG tablet take 1 tablet by mouth every 6 hours if needed 04/27/17  Yes Fisher, Demetrios Isaacs, MD    celecoxib (CELEBREX) 100 MG capsule Take 1 capsule (100 mg total) by mouth 2 (two) times daily as needed. Patient taking differently: Take 200 mg by mouth daily as needed.  10/24/16  Yes Malva Limes, MD  clopidogrel (PLAVIX) 75 MG tablet Take 75 mg by mouth daily.   Yes [provider]  cyclobenzaprine (FLEXERIL) 5 MG tablet Take 1 tablet (5 mg total) by mouth 3 (three) times daily as needed for muscle spasms (for disconfort after radiofrequency). 04/14/17 04/29/17 Yes Delano Metz, MD  diphenoxylate-atropine (LOMOTIL) 2.5-0.025 MG per tablet Take 2 tablets by mouth 4 (four) times daily as needed for diarrhea or loose stools. Reported on 03/11/2016   Yes [provider]  donepezil (ARICEPT) 10 MG tablet Take 10 mg by mouth at bedtime.   Yes [provider]  estropipate (OGEN) 0.75 MG tablet Take 1 tablet (0.75 mg total) by mouth every other day. Patient taking differently: Take 0.75 mg by mouth daily.  12/17/16  Yes Malva Limes, MD  fentaNYL (DURAGESIC - DOSED MCG/HR) 50 MCG/HR Place 1 patch (50 mcg total) onto the skin every 3 (three) days. 04/18/17 05/18/17 Yes Delano Metz, MD  furosemide (LASIX) 40 MG tablet Take 40 mg by mouth  daily.  02/16/17  Yes [provider]  LYRICA 300 MG capsule Take 300 mg by mouth 2 (two) times daily. 04/12/17  Yes [provider]  pantoprazole (PROTONIX) 40 MG tablet take 1 tablet by mouth twice a day Patient taking differently: Take 40 mg by mouth once daily 04/17/17  Yes Fisher, Demetrios Isaacsonald E, MD  simvastatin (ZOCOR) 10 MG tablet Take 10 mg by mouth at bedtime. Reported on 12/04/2015   Yes [provider]  SPIRIVA HANDIHALER 18 MCG inhalation capsule inhale the contents of one capsule in the handihaler once daily 11/16/16  Yes Fisher, Demetrios Isaacsonald E, MD  SUMAtriptan (IMITREX) 25 MG tablet Take 25 mg by mouth as needed for migraine. May repeat in 2 hours if headache persists or recurs.   Yes [provider]  traMADol (ULTRAM) 50 MG tablet Take 2 tablets (100 mg total) by mouth every 6 (six) hours as needed for moderate pain or severe pain. 03/19/17 06/17/17 Yes Delano MetzNaveira, Francisco, MD  benzonatate (TESSALON PERLES) 100 MG capsule Take 1 capsule (100 mg total) by mouth 3 (three) times daily as needed for cough. Patient not taking: Reported on 04/29/2017 01/28/17   Malva LimesFisher, Donald E, MD  fentaNYL (DURAGESIC - DOSED MCG/HR) 50 MCG/HR Place 1 patch (50 mcg total) onto the skin every 3 (three) days. 03/19/17 04/18/17  Delano MetzNaveira, Francisco, MD  fentaNYL (DURAGESIC - DOSED MCG/HR) 50 MCG/HR Place 1 patch (50 mcg total) onto the skin every 3 (three) days. Patient not taking: Reported on 04/29/2017 05/18/17 06/17/17  Delano MetzNaveira, Francisco, MD  fluticasone [facility](FLONASE) 50 MCG/ACT nasal spray Place 2 sprays into both nostrils daily. Patient not taking: Reported on 04/29/2017 04/09/16   Malva LimesFisher, Donald E, MD  triamcinolone ointment (KENALOG) 0.5 % Apply to lesions on hands twice a day as needed Patient not taking: Reported on 04/29/2017 10/24/16   Malva LimesFisher, Donald E, MD    Allergies Percocet [oxycodone-acetaminophen]; Aspirin; Codeine; Propoxyphene; and Sulfa antibiotics  Family History  Problem Relation Age of Onset  . Cancer Mother   . Arthritis Mother   . Heart disease Mother   . Diabetes Mother        mellitus, type 2  . Heart disease Father   . Diabetes Sister   . Cancer Brother   . Cancer Brother        lung  . Diabetes Brother     Social History Social History  Substance Use Topics  . Smoking status: Current Every Day Smoker    Packs/day: 1.00    Years: 50.00    Types: Cigarettes  . Smokeless tobacco: Never Used     Comment: Previously smoked 2 ppd  . Alcohol use No    Review of Systems  Constitutional: + fever and ams Eyes: Negative for visual changes. ENT: Negative for sore throat. Neck: No neck pain  Cardiovascular: Negative for chest pain. Respiratory: Negative for shortness of  breath. Gastrointestinal: Negative for abdominal pain, diarrhea. + vomiting Genitourinary: Negative for dysuria. Musculoskeletal: Negative for back pain. Skin: Negative for rash. Neurological: Negative for headaches, weakness or numbness. Psych: No SI or HI  ____________________________________________   PHYSICAL EXAM:  VITAL SIGNS: ED Triage Vitals  Enc Vitals Group     BP 04/29/17 1028 133/60     Pulse Rate 04/29/17 1028 76     Resp 04/29/17 1028 18     Temp 04/29/17 1024 (!) 101.5 F (38.6 C)     Temp Source 04/29/17 1024 Oral     SpO2 04/29/17  1028 91 %     Weight 04/29/17 1025 93 lb (42.2 kg)     Height 04/29/17 1025 4\' 10"  (1.473 m)     Head Circumference --      Peak Flow --      Pain Score --      Pain Loc --      Pain Edu? --      Excl. in GC? --     Constitutional: Sleepy but arousable, oriented to self only, looks dry on exam  HEENT:      Head: Normocephalic and atraumatic.         Eyes: Conjunctivae are normal. Sclera is non-icteric.       Mouth/Throat: Mucous membranes are dry.       Neck: Supple with no signs of meningismus. Cardiovascular: Regular rate and rhythm. No murmurs, gallops, or rubs. 2+ symmetrical distal pulses are present in all extremities. No JVD. Respiratory: Normal respiratory effort, hypoxic on RA requiring 4L [place], lung apices and lateral bases with clear breath sounds no crackles or wheezes. Gastrointestinal: Soft, non tender, and non distended with positive bowel sounds. No rebound or guarding. Musculoskeletal: Nontender with normal range of motion in all extremities. No edema, cyanosis, or erythema of extremities. Neurologic: Normal speech and language. Face is symmetric. Moving all extremities. No gross focal neurologic deficits are appreciated. Skin: Skin is warm, dry and intact. No rash noted.   ____________________________________________   LABS (all labs ordered are listed, but only abnormal results are displayed)  Labs  Reviewed  URINALYSIS, COMPLETE (UACMP) WITH MICROSCOPIC - Abnormal; Notable for the following:       Result Value   Color, Urine COLORLESS (*)    APPearance CLEAR (*)    Bacteria, UA RARE (*)    Squamous Epithelial / LPF 0-5 (*)    All other components within normal limits  COMPREHENSIVE METABOLIC PANEL - Abnormal; Notable for the following:    Potassium 3.1 (*)    Chloride 96 (*)    Creatinine, Ser 1.04 (*)    Total Protein 8.2 (*)    ALT 12 (*)    GFR calc non Af Amer 54 (*)    All other components within normal limits  GLUCOSE, CAPILLARY - Abnormal; Notable for the following:    Glucose-Capillary 56 (*)    All other components within normal limits  CBC - Abnormal; Notable for the following:    WBC 12.3 (*)    Hemoglobin 10.9 (*)    HCT 32.0 (*)    RDW 17.8 (*)    All other components within normal limits  CULTURE, BLOOD (ROUTINE X 2)  CULTURE, BLOOD (ROUTINE X 2)  URINE CULTURE  LACTIC ACID, PLASMA  TROPONIN I  LACTIC ACID, PLASMA  CBG MONITORING, ED   ____________________________________________  EKG  ED ECG REPORT I, Nita Sickle, the attending physician, personally viewed and interpreted this ECG.  Normal sinus rhythm, rate of 76, normal intervals, normal axis, diffuse ST depressions on inferior and lateral leads, no ST elevation. This depressions are present on EKG from December 2017 ____________________________________________  RADIOLOGY  CXR:  1. Questionable new mild left perihilar infiltrate. Overlying EKG leads make evaluation difficult.  2. Persistent left base pleural-parenchymal thickening consistent with scarring. Similar findings noted on multiple prior exams.  3. Stable cardiomegaly . ____________________________________________   PROCEDURES  Procedure(s) performed: None Procedures Critical Care performed: yes  CRITICAL CARE Performed by: Nita Sickle  ?  Total critical care time: 35 min  Critical care time was exclusive  of separately billable procedures and treating other patients.  Critical care was necessary to treat or prevent imminent or life-threatening deterioration.  Critical care was time spent personally by me on the following activities: development of treatment plan with patient and/or surrogate as well as nursing, discussions with consultants, evaluation of patient's response to treatment, examination of patient, obtaining history from patient or surrogate, ordering and performing treatments and interventions, ordering and review of laboratory studies, ordering and review of radiographic studies, pulse oximetry and re-evaluation of patient's condition.  ____________________________________________   INITIAL IMPRESSION / ASSESSMENT AND PLAN / ED COURSE  66 y.o. female with h/o COPD, aspiration pneumonia. CAD, DVT, PVD, HTN, HLD, dementia who presents for evaluation of altered mental status. Patient arrives to the emergency room with new oxygen requirement of 4 L due to hypoxia, chest x-ray concerning for left perihilar infiltrate. Patient meets sepsis criteria with a fever of 103F. leukocytosis with white count of 12.3. UA is negative for infection. Blood glucose of 56, patient has not eaten anything today. We'll give fluids with glucose. Blood cultures are pending. Patient has not been admitted in the last 3 months therefore will treat for community-acquired pneumonia with Rocephin and azithromycin. We'll give IV fluids per sepsis protocol. Anticipate admission to the hospital.     Pertinent labs & imaging results that were available during my care of the patient were reviewed by me and considered in my medical decision making (see chart for details).    ____________________________________________   FINAL CLINICAL IMPRESSION(S) / ED DIAGNOSES  Final diagnoses:  Sepsis, due to unspecified organism [facility])  Community acquired pneumonia of left lung, unspecified part of lung      NEW  MEDICATIONS STARTED DURING THIS VISIT:  New Prescriptions   No medications on file     Note:  This document was prepared using Dragon voice recognition software and may include unintentional dictation errors.    Don Perking, [place], MD 04/29/17 1225

## 2017-04-30 ENCOUNTER — Inpatient Hospital Stay: Payer: Medicare HMO

## 2017-04-30 LAB — BLOOD GAS, ARTERIAL
Acid-Base Excess: 3.9 mmol/L — ABNORMAL HIGH (ref 0.0–2.0)
Bicarbonate: 26.2 mmol/L (ref 20.0–28.0)
FIO2: 0.28
O2 Saturation: 94.1 %
PATIENT TEMPERATURE: 37
pCO2 arterial: 30 mmHg — ABNORMAL LOW (ref 32.0–48.0)
pH, Arterial: 7.55 — ABNORMAL HIGH (ref 7.350–7.450)
pO2, Arterial: 61 mmHg — ABNORMAL LOW (ref 83.0–108.0)

## 2017-04-30 LAB — GLUCOSE, CAPILLARY
GLUCOSE-CAPILLARY: 73 mg/dL (ref 65–99)
GLUCOSE-CAPILLARY: 224 mg/dL — AB (ref 65–99)
Glucose-Capillary: 50 mg/dL — ABNORMAL LOW (ref 65–99)
Glucose-Capillary: 194 mg/dL — ABNORMAL HIGH (ref 65–99)
Glucose-Capillary: 223 mg/dL — ABNORMAL HIGH (ref 65–99)

## 2017-04-30 LAB — BASIC METABOLIC PANEL
ANION GAP: 4 — AB (ref 5–15)
ANION GAP: 8 (ref 5–15)
BUN: 12 mg/dL (ref 6–20)
BUN: 10 mg/dL (ref 6–20)
CALCIUM: 8.1 mg/dL — AB (ref 8.9–10.3)
CALCIUM: 8.8 mg/dL — AB (ref 8.9–10.3)
CHLORIDE: 107 mmol/L (ref 101–111)
CO2: 29 mmol/L (ref 22–32)
CO2: 26 mmol/L (ref 22–32)
CREATININE: 0.71 mg/dL (ref 0.44–1.00)
Chloride: 107 mmol/L (ref 101–111)
Creatinine, Ser: 0.67 mg/dL (ref 0.44–1.00)
GFR calc non Af Amer: >60 mL/min (ref >60)
Glucose, Bld: 83 mg/dL (ref 65–99)
Glucose, Bld: 140 mg/dL — ABNORMAL HIGH (ref 65–99)
Potassium: 3.6 mmol/L (ref 3.5–5.1)
Potassium: 4.3 mmol/L (ref 3.5–5.1)
SODIUM: 141 mmol/L (ref 135–145)
Sodium: 140 mmol/L (ref 135–145)

## 2017-04-30 LAB — CBC
HCT: 27.6 % — ABNORMAL LOW (ref 35.0–47.0)
HEMOGLOBIN: 9 g/dL — AB (ref 12.0–16.0)
MCH: 27 pg (ref 26.0–34.0)
MCHC: 32.5 g/dL (ref 32.0–36.0)
MCV: 83.1 fL (ref 80.0–100.0)
Platelets: 180 10*3/uL (ref 150–440)
RBC: 3.33 MIL/uL — AB (ref 3.80–5.20)
RDW: 17.9 % — ABNORMAL HIGH (ref 11.5–14.5)
WBC: 9.6 10*3/uL (ref 3.6–11.0)

## 2017-04-30 LAB — URINE CULTURE: CULTURE: NO GROWTH

## 2017-04-30 MED ORDER — CLOPIDOGREL BISULFATE 75 MG PO TABS
75.0000 mg | ORAL_TABLET | Freq: Every day | ORAL | Status: DC
  Administered 2017-05-01 – 2017-05-02 (×2): 75 mg via ORAL
  Filled 2017-04-30 (×2): qty 1

## 2017-04-30 MED ORDER — SODIUM CHLORIDE 0.9 % IV BOLUS (SEPSIS)
500.0000 mL | Freq: Once | INTRAVENOUS | Status: AC
  Administered 2017-04-30: 21:00:00 500 mL via INTRAVENOUS

## 2017-04-30 MED ORDER — METHYLPREDNISOLONE SODIUM SUCC 125 MG IJ SOLR
60.0000 mg | Freq: Two times a day (BID) | INTRAMUSCULAR | Status: DC
  Administered 2017-04-30 – 2017-05-02 (×5): 60 mg via INTRAVENOUS
  Filled 2017-04-30 (×5): qty 2

## 2017-04-30 MED ORDER — ENOXAPARIN SODIUM 40 MG/0.4ML ~~LOC~~ SOLN
40.0000 mg | SUBCUTANEOUS | Status: DC
  Administered 2017-04-30: 40 mg via SUBCUTANEOUS
  Filled 2017-04-30: qty 0.4

## 2017-04-30 MED ORDER — AZITHROMYCIN 500 MG PO TABS
500.0000 mg | ORAL_TABLET | Freq: Every day | ORAL | Status: DC
  Administered 2017-05-01: 500 mg via ORAL
  Filled 2017-04-30: qty 1

## 2017-04-30 MED ORDER — DEXTROSE 50 % IV SOLN
25.0000 mL | Freq: Once | INTRAVENOUS | Status: AC
  Administered 2017-04-30: 25 mL via INTRAVENOUS

## 2017-04-30 MED ORDER — DEXTROSE 50 % IV SOLN
INTRAVENOUS | Status: AC
  Filled 2017-04-30: qty 50

## 2017-04-30 NOTE — Progress Notes (Signed)
1/2 amp D50 effective, FSBS 194

## 2017-04-30 NOTE — Progress Notes (Signed)
Patient concerned about possible vomit in her bangs. Washed, rinsed, and dried bangs. Brushed hair down once complete.

## 2017-04-30 NOTE — Progress Notes (Signed)
Chaplain received a page rapid response to visit room 103. Family member was in distress over sisters illness. Chaplain provided comfort to family member and is available for follow up.    04/30/17 2038  Clinical Encounter Type  Visited With Patient;Patient and family together  Visit Type Initial;Spiritual support  Referral From Nurse  Consult/Referral To Chaplain  Spiritual Encounters  Spiritual Needs Emotional;Grief support

## 2017-04-30 NOTE — Progress Notes (Signed)
Pt requesting flexeril for leg pain and spasms, flexeril given per orders

## 2017-04-30 NOTE — Progress Notes (Signed)
Rapid Response Event Note  Overview:RR called pt was lying in bed lethargic will open eyes and follow commands hypotensive.pt was given a bolus 500ml . HR -NSR . sats WNL on 2lnc.The care nurse talking to MD new orders received.       Initial Focused Assessment:   Interventions:continue bolus and MD orders and notify ICU if transfer needed.  Plan of Care (if not transferred):  Event Summary:   at  2100     at          [facility]cWilliams,Michelle J

## 2017-04-30 NOTE — Progress Notes (Signed)
Patient seen 2045 for rapid response. No interventions need at that time. ABG now obtained. See results

## 2017-04-30 NOTE — Progress Notes (Signed)
[employer] [facility] [place] [facility]   PATIENT NAME: Cheryl [name]    MR#:  [id number]  DATE OF BIRTH:  [birth year]  SUBJECTIVE:  CHIEF COMPLAINT:   Chief Complaint  Patient presents with  . Weakness  . Loss of Consciousness   Weak. SOB and cough.  More awake and less confused per daughter at bedside    REVIEW OF SYSTEMS:    Review of Systems  Unable to perform ROS: Mental status change    DRUG ALLERGIES:   Allergies  Allergen Reactions  . Percocet [Oxycodone-Acetaminophen] Hives and Rash  . Aspirin Nausea And Vomiting and Other (See Comments)    Reaction:  GI upset   . Codeine Nausea And Vomiting and Other (See Comments)    Reaction:  GI upset   . Propoxyphene Other (See Comments) and Nausea Only    GI upset Reaction:  GI upset   . Sulfa Antibiotics Rash and Other (See Comments)    Reaction:  GI upset     VITALS:  Blood pressure (!) 101/39, pulse 75, temperature 98.9 F (37.2 C), temperature source Oral, resp. rate 20, height 4\' 10"  (1.473 m), weight 44 kg (97 lb), SpO2 95 %.  PHYSICAL EXAMINATION:   Physical Exam  GENERAL:  66 y.o.-year-old patient lying in the bed with no acute distress.  EYES: Pupils equal, round, reactive to light and accommodation. No scleral icterus. Extraocular muscles intact.  HEENT: Head atraumatic, normocephalic. Oropharynx and nasopharynx clear.  NECK:  Supple, no jugular venous distention. No thyroid enlargement, no tenderness.  LUNGS: Bil;ateral wheezing CARDIOVASCULAR: S1, S2 normal. No murmurs, rubs, or gallops.  ABDOMEN: Soft, nontender, nondistended. Bowel sounds present. No organomegaly or mass.  EXTREMITIES: No cyanosis, clubbing or edema b/l.    NEUROLOGIC: Cranial nerves II through XII are intact. No focal Motor or sensory deficits b/l.   PSYCHIATRIC: The patient is alert and awake. SKIN: No obvious rash, lesion, or ulcer.   LABORATORY PANEL:   CBC  Recent Labs Lab 04/30/17 0308  WBC 9.6  HGB 9.0*   HCT 27.6*  PLT 180   ------------------------------------------------------------------------------------------------------------------ Chemistries   Recent Labs Lab 04/29/17 1053 04/29/17 1200 04/30/17 0308  NA 138  --  140  K 3.1*  --  3.6  CL 96*  --  107  CO2 31  --  29  GLUCOSE 89  --  83  BUN 19  --  12  CREATININE 1.04*  --  0.67  CALCIUM 9.1  --  8.1*  MG  --  1.9  --   AST 21  --   --   ALT 12*  --   --   ALKPHOS 115  --   --   BILITOT 0.6  --   --    ------------------------------------------------------------------------------------------------------------------  Cardiac Enzymes  Recent Labs Lab 04/29/17 1053  TROPONINI <0.03   ------------------------------------------------------------------------------------------------------------------  RADIOLOGY:  Dg Chest Port 1 View  Result Date: 04/29/2017 CLINICAL DATA:  COPD. EXAM: PORTABLE CHEST 1 VIEW COMPARISON:  01/28/2017 . 01/05/2017. 11/05/2016. 01/22/2015 CT 06/03/2016. FINDINGS: Mediastinum and hilar structures normal. Persistent left base pleuroparenchymal thickening noted consistent with scarring. Possible new infiltrate left perihilar region. Overlying EKG leads make evaluation difficult. No pleural effusion or pneumothorax . Heart size stable. IMPRESSION: 1. Questionable new mild left perihilar infiltrate. Overlying EKG leads make evaluation difficult. 2. Persistent left base pleural-parenchymal thickening consistent with scarring. Similar findings noted on multiple prior exams. 3. Stable cardiomegaly . Electronically Signed   By: Maisie Fus  Register   On: 04/29/2017 10:59     ASSESSMENT AND PLAN:   * Left pneumonia with acute on chronic hypoxic respiratory failure and COPD exacerbation -IV steroids, Antibiotics - Scheduled Nebulizers - Inhalers -Wean O2 as tolerated - Consult pulmonary if no improvement  * Sepsis Improved  * Acute encephalopathy due to sepsis improving  * DVT  prophylaxis Lovenox  All the records are reviewed and case discussed with Care Management/Social Workerr. Management plans discussed with the patient, family and they are in agreement.  CODE STATUS: FULL CODE  DVT Prophylaxis: SCDs  TOTAL TIME TAKING CARE OF THIS PATIENT: 30 minutes.   POSSIBLE D/C IN 1-2 DAYS, DEPENDING ON CLINICAL CONDITION.  Milagros LollSudini, Sloan Galentine R M.D on 04/30/2017 at 12:56 PM  Between 7am to 6pm - Pager - [phone]  After 6pm go to [email or web address] - password [email or web address] [facility]  [facility]  Office  [phone]  CC: Primary care physician; Malva LimesFisher, Donald E, MD  Note: This dictation was prepared with Dragon dictation along with smaller phrase technology. Any transcriptional errors that result from this process are unintentional.

## 2017-04-30 NOTE — Progress Notes (Signed)
Dr Elpidio AnisSudini made aware that pt more lethargic and sleepy this afternoon, checked BS, pts BS is 50, cant drink orange juice, made aware that I placed order for 1/2 amp D50, MD agrees with this order

## 2017-04-30 NOTE — Progress Notes (Addendum)
Pts FSBS 50 orange juice given but pt states that she doesn't feel like she can drink orange juice right now, so 1/2 amp D50 ordered, will recheck BS

## 2017-05-01 LAB — GLUCOSE, CAPILLARY
Glucose-Capillary: 115 mg/dL — ABNORMAL HIGH (ref 65–99)
Glucose-Capillary: 145 mg/dL — ABNORMAL HIGH (ref 65–99)
Glucose-Capillary: 149 mg/dL — ABNORMAL HIGH (ref 65–99)
Glucose-Capillary: 161 mg/dL — ABNORMAL HIGH (ref 65–99)

## 2017-05-01 LAB — MRSA PCR SCREENING: MRSA by PCR: POSITIVE — AB

## 2017-05-01 MED ORDER — CHLORHEXIDINE GLUCONATE CLOTH 2 % EX PADS
6.0000 | MEDICATED_PAD | Freq: Every day | CUTANEOUS | Status: DC
  Administered 2017-05-01 – 2017-05-02 (×2): 6 via TOPICAL

## 2017-05-01 MED ORDER — INSULIN ASPART 100 UNIT/ML ~~LOC~~ SOLN
0.0000 [IU] | Freq: Three times a day (TID) | SUBCUTANEOUS | Status: DC
  Administered 2017-05-01: 17:00:00 1 [IU] via SUBCUTANEOUS
  Administered 2017-05-02: 17:00:00 2 [IU] via SUBCUTANEOUS
  Filled 2017-05-01: qty 1
  Filled 2017-05-01: qty 2

## 2017-05-01 MED ORDER — AZITHROMYCIN 500 MG PO TABS
500.0000 mg | ORAL_TABLET | Freq: Every day | ORAL | Status: DC
  Administered 2017-05-02: 500 mg via ORAL
  Filled 2017-05-01: qty 1

## 2017-05-01 MED ORDER — MUPIROCIN 2 % EX OINT
1.0000 "application " | TOPICAL_OINTMENT | Freq: Two times a day (BID) | CUTANEOUS | Status: DC
  Administered 2017-05-01 – 2017-05-02 (×4): 1 via NASAL
  Filled 2017-05-01 (×2): qty 22

## 2017-05-01 MED ORDER — ENOXAPARIN SODIUM 30 MG/0.3ML ~~LOC~~ SOLN
30.0000 mg | SUBCUTANEOUS | Status: DC
  Administered 2017-05-01: 22:00:00 30 mg via SUBCUTANEOUS
  Filled 2017-05-01: qty 0.3

## 2017-05-01 MED ORDER — NICOTINE 7 MG/24HR TD PT24
7.0000 mg | MEDICATED_PATCH | Freq: Every day | TRANSDERMAL | Status: DC
  Filled 2017-05-01 (×3): qty 1

## 2017-05-01 NOTE — Consult Note (Signed)
Name: Cheryl [name] MRN: [id number] DOB: 1951-06-19    ADMISSION DATE:  04/29/2017  CONSULTATION DATE:  05/01/17   REFERRING MD :  Dr. Claudette Stapler  CHIEF COMPLAINT:  lethargy  BRIEF PATIENT DESCRIPTION: 66 year old female admitted with sepsis secondary to PNA,became more lethargic and with AMS over the course of the day.  Patient transferred to the ICU with? acute encephalopathy  related to sepsis for further management  SIGNIFICANT EVENTS  6/5 >> patient admitted  With sepsis secondary to PNA 6/7>> Patient admitted to SDU with?encephalopathy secondary to sepsis  STUDIES:  04/30/17 CT head>>Chronic mild-to-moderate small vessel ischemic disease ofperiventricular and subcortical white matter. No acute intracranial abnormality.    HISTORY OF PRESENT ILLNESS:  Cheryl [name] is a 66 year old female with known history of COPD- uses 2l of O2 at baseline, chronic back pain,CAD,DVT,GERD,HTN and Hyperlipidemia.  Patient was admitted to [facility] on 6/5 with sepsis secondary to CAP.  On 6/7 Dr. Claudette Stapler transferred the patient to the ICU stating that she is unresponsive, encephalopathic secondary to sepsis. However upon arrival to the unit patient is fully alert and oriented. CT of the head was negative for any acute abnormality.  PAST MEDICAL HISTORY :   has a past medical history of Allergy; Anxiety; Arthritis; Aspiration pneumonitis (HCC) (11/24/2015); Asthma; Chronic pain; COPD (chronic obstructive pulmonary disease) (HCC); Coronary artery disease; DVT (deep venous thrombosis) (HCC); GERD (gastroesophageal reflux disease); Headache; Hyperlipidemia; Hypertension; Migraines; Neuropathy (2010); Osteoporosis; Oxygen deficiency; Peripheral vascular disease (HCC); Pneumonia; Pneumonia (11/19/2015); Pneumonia (10/2016); and Vitamin D deficiency.  has a past surgical history that includes Appendectomy; Spine surgery; Foot surgery (Bilateral); cardiac catherization (10/31/2009); abdomnal aortic stent  (05/30/2008); Abdominal hysterectomy (1975); Cholecystectomy (1972); Cervical fusion (C5 - 6/C6-7); Appendectomy; Colonoscopy with propofol (N/A, 07/27/2015); and Esophagogastroduodenoscopy (egd) with propofol (N/A, 07/27/2015). Prior to Admission medications   Medication Sig Start Date End Date Taking? Authorizing Provider  acetaminophen (TYLENOL) 325 MG tablet Take 2 tablets (650 mg total) by mouth every 6 (six) hours as needed for mild pain (or Fever >/= 101). 12/25/16  Yes Enid Baas, MD  ADVAIR DISKUS 250-50 MCG/DOSE AEPB inhale 1 dose by mouth twice a day 08/15/16  Yes Fisher, Demetrios Isaacs, MD  albuterol (PROVENTIL HFA;VENTOLIN HFA) 108 (90 BASE) MCG/ACT inhaler Inhale 2 puffs into the lungs every 4 (four) hours as needed for wheezing or shortness of breath. 08/11/15  Yes Malva Limes, MD  albuterol (PROVENTIL) (2.5 MG/3ML) 0.083% nebulizer solution Take 3 mLs (2.5 mg total) by nebulization every 4 (four) hours as needed for wheezing. 10/12/15  Yes Katharina Caper, MD  ALPRAZolam Prudy Feeler) 0.5 MG tablet take 1 tablet by mouth every 6 hours if needed 04/27/17  Yes Fisher, Demetrios Isaacs, MD  celecoxib (CELEBREX) 100 MG capsule Take 1 capsule (100 mg total) by mouth 2 (two) times daily as needed. Patient taking differently: Take 200 mg by mouth daily as needed.  10/24/16  Yes Malva Limes, MD  clopidogrel (PLAVIX) 75 MG tablet Take 75 mg by mouth daily.   Yes [provider]  cyclobenzaprine (FLEXERIL) 5 MG tablet Take 1 tablet (5 mg total) by mouth 3 (three) times daily as needed for muscle spasms (for disconfort after radiofrequency). 04/14/17 04/29/17 Yes Delano Metz, MD  diphenoxylate-atropine (LOMOTIL) 2.5-0.025 MG per tablet Take 2 tablets by mouth 4 (four) times daily as needed for diarrhea or loose stools. Reported on 03/11/2016   Yes [provider]  donepezil (ARICEPT) 10 MG tablet Take  10 mg by mouth at bedtime.   Yes [provider]  estropipate (OGEN) 0.75 MG  tablet Take 1 tablet (0.75 mg total) by mouth every other day. Patient taking differently: Take 0.75 mg by mouth daily.  12/17/16  Yes Malva Limes, MD  fentaNYL (DURAGESIC - DOSED MCG/HR) 50 MCG/HR Place 1 patch (50 mcg total) onto the skin every 3 (three) days. 04/18/17 05/18/17 Yes Delano Metz, MD  furosemide (LASIX) 40 MG tablet Take 40 mg by mouth daily.  02/16/17  Yes [provider]  LYRICA 300 MG capsule Take 300 mg by mouth 2 (two) times daily. 04/12/17  Yes [provider]  pantoprazole (PROTONIX) 40 MG tablet take 1 tablet by mouth twice a day Patient taking differently: Take 40 mg by mouth once daily 04/17/17  Yes Fisher, Demetrios Isaacs, MD  simvastatin (ZOCOR) 10 MG tablet Take 10 mg by mouth at bedtime. Reported on 12/04/2015   Yes [provider]  SPIRIVA HANDIHALER 18 MCG inhalation capsule inhale the contents of one capsule in the handihaler once daily 11/16/16  Yes Fisher, Demetrios Isaacs, MD  SUMAtriptan (IMITREX) 25 MG tablet Take 25 mg by mouth as needed for migraine. May repeat in 2 hours if headache persists or recurs.   Yes [provider]  traMADol (ULTRAM) 50 MG tablet Take 2 tablets (100 mg total) by mouth every 6 (six) hours as needed for moderate pain or severe pain. 03/19/17 06/17/17 Yes Delano Metz, MD  benzonatate (TESSALON PERLES) 100 MG capsule Take 1 capsule (100 mg total) by mouth 3 (three) times daily as needed for cough. Patient not taking: Reported on 04/29/2017 01/28/17   Malva Limes, MD  fentaNYL (DURAGESIC - DOSED MCG/HR) 50 MCG/HR Place 1 patch (50 mcg total) onto the skin every 3 (three) days. 03/19/17 04/18/17  Delano Metz, MD  fentaNYL (DURAGESIC - DOSED MCG/HR) 50 MCG/HR Place 1 patch (50 mcg total) onto the skin every 3 (three) days. Patient not taking: Reported on 04/29/2017 05/18/17 06/17/17  Delano Metz, MD  fluticasone [facility]) 50 MCG/ACT nasal spray Place 2 sprays into both nostrils daily. Patient not  taking: Reported on 04/29/2017 04/09/16   Malva Limes, MD  triamcinolone ointment (KENALOG) 0.5 % Apply to lesions on hands twice a day as needed Patient not taking: Reported on 04/29/2017 10/24/16   Malva Limes, MD   Allergies  Allergen Reactions  . Percocet [Oxycodone-Acetaminophen] Hives and Rash  . Aspirin Nausea And Vomiting and Other (See Comments)    Reaction:  GI upset   . Codeine Nausea And Vomiting and Other (See Comments)    Reaction:  GI upset   . Propoxyphene Other (See Comments) and Nausea Only    GI upset Reaction:  GI upset   . Sulfa Antibiotics Rash and Other (See Comments)    Reaction:  GI upset     FAMILY HISTORY:  family history includes Arthritis in her mother; Cancer in her brother, brother, and mother; Diabetes in her brother, mother, and sister; Heart disease in her father and mother. SOCIAL HISTORY:  reports that she has been smoking Cigarettes.  She has a 50.00 pack-year smoking history. She has never used smokeless tobacco. She reports that she does not drink alcohol or use drugs.  REVIEW OF SYSTEMS:   Constitutional: Negative for fever, chills, weight loss, malaise/fatigue and diaphoresis.  HENT: Negative for hearing loss, ear pain, nosebleeds, congestion, sore throat, neck pain, tinnitus and ear discharge.   Eyes: Negative for  blurred vision, double vision, photophobia, pain, discharge and redness.  Respiratory: Negative for cough, hemoptysis, sputum production, shortness of breath, wheezing and stridor.   Cardiovascular: Negative for chest pain, palpitations, orthopnea, claudication, leg swelling and PND.  Gastrointestinal: Negative for heartburn, nausea, vomiting, abdominal pain, diarrhea, constipation, blood in stool and melena.  Genitourinary: Negative for dysuria, urgency, frequency, hematuria and flank pain.  Musculoskeletal: Negative for myalgias, back pain, joint pain and falls.  Skin: Negative for itching and rash.  Neurological: Negative  for dizziness, tingling, tremors, sensory change, speech change, focal weakness, seizures, loss of consciousness, weakness and headaches.  Endo/Heme/Allergies: Negative for environmental allergies and polydipsia. Does not bruise/bleed easily.  SUBJECTIVE: Patient states that "she is cold"  VITAL SIGNS: Temp:  [97.6 F (36.4 C)-99.8 F (37.7 C)] 97.6 F (36.4 C) (06/06 2304) Pulse Rate:  [62-83] 62 (06/06 2304) Resp:  [18-20] 18 (06/06 2304) BP: (95-130)/(36-57) 130/57 (06/06 2304) SpO2:  [94 %-100 %] 99 % (06/06 2304) Weight:  [44 kg (97 lb)] 44 kg (97 lb) (06/06 0407)  PHYSICAL EXAMINATION: General:  66 year old female,on 2liters of O2 Neuro:  Awake, Alert and oriented HEENT:  AT, [place] , No JVD Cardiovascular:  S1s2,Regular, no m/r/g noted Lungs:  Diminished bibasilar, no wheezes,crackles,rhonchi noted Abdomen:  Soft, flat, NT,ND Musculoskeletal:  No edema,cyanosis noted Skin: warm,dry and Intact   Recent Labs Lab 04/29/17 1053 04/30/17 0308 04/30/17 2306  NA 138 140 141  K 3.1* 3.6 4.3  CL 96* 107 107  CO2 31 29 26   BUN 19 12 10   CREATININE 1.04* 0.67 0.71  GLUCOSE 89 83 140*    Recent Labs Lab 04/29/17 1053 04/30/17 0308  HGB 10.9* 9.0*  HCT 32.0* 27.6*  WBC 12.3* 9.6  PLT 221 180   Ct Head Wo Contrast  Result Date: 04/30/2017 CLINICAL DATA:  Unresponsive and not responding to verbal components. Altered mental status. EXAM: CT HEAD WITHOUT CONTRAST TECHNIQUE: Contiguous axial images were obtained from the base of the skull through the vertex without intravenous contrast. COMPARISON:  None. FINDINGS: Brain: Normal size ventricles. No intra-axial mass, hemorrhage or midline shift. No extra-axial fluid collections. Chronic stable mild-to-moderate small vessel ischemic disease of periventricular and subcortical white matter. No large vascular territory infarct. Vascular: Calcifications of the carotid siphons bilaterally. No hyperdense vessels. Skull: The skull fracture  nor primary osseous lesions. Sinuses/Orbits: Intact orbits and globes bilaterally. No acute sinus disease. Clear mastoids. Other: None IMPRESSION: Chronic mild-to-moderate small vessel ischemic disease of periventricular and subcortical white matter. No acute intracranial abnormality. Electronically Signed   By: Tollie Ethavid  Kwon M.D.   On: 04/30/2017 22:39   Dg Chest Port 1 View  Result Date: 04/29/2017 CLINICAL DATA:  COPD. EXAM: PORTABLE CHEST 1 VIEW COMPARISON:  01/28/2017 . 01/05/2017. 11/05/2016. 01/22/2015 CT 06/03/2016. FINDINGS: Mediastinum and hilar structures normal. Persistent left base pleuroparenchymal thickening noted consistent with scarring. Possible new infiltrate left perihilar region. Overlying EKG leads make evaluation difficult. No pleural effusion or pneumothorax . Heart size stable. IMPRESSION: 1. Questionable new mild left perihilar infiltrate. Overlying EKG leads make evaluation difficult. 2. Persistent left base pleural-parenchymal thickening consistent with scarring. Similar findings noted on multiple prior exams. 3. Stable cardiomegaly . Electronically Signed   By: Maisie Fushomas  Register   On: 04/29/2017 10:59    ASSESSMENT / PLAN:  CAP COPD Current smoker Acute encephalopathy  Secondary to sepsis-resolving   Plan Continue Azithromycin/ceftriaxone Continue I/V Fluids Continue steroids,taper Nicotine Patch ordered CT head negative -04/30/17 Rest per primary  Bincy Varughese,AG-ACNP Pulmonary and Critical Care Medicine [facility]   05/01/2017, 1:22 AM   Billy Fischer, MD PCCM service Mobile [phone] Pager [phone] 05/01/2017 12:24 PM

## 2017-05-01 NOTE — Progress Notes (Signed)
[facility] [place] [facility]   PATIENT NAME: Cheryl [name]    MR#:  [id number]  DATE OF BIRTH:  09/05/1951  SUBJECTIVE:  CHIEF COMPLAINT:   Chief Complaint  Patient presents with  . Weakness  . Loss of Consciousness   Still has shortness of breath and cough. Transferred overnight to stepdown area due to some confusion and worsening encephalopathy.  Daughter at bedside.  REVIEW OF SYSTEMS:    Review of Systems  Unable to perform ROS: Mental status change    DRUG ALLERGIES:   Allergies  Allergen Reactions  . Percocet [Oxycodone-Acetaminophen] Hives and Rash  . Aspirin Nausea And Vomiting and Other (See Comments)    Reaction:  GI upset   . Codeine Nausea And Vomiting and Other (See Comments)    Reaction:  GI upset   . Propoxyphene Other (See Comments) and Nausea Only    GI upset Reaction:  GI upset   . Sulfa Antibiotics Rash and Other (See Comments)    Reaction:  GI upset     VITALS:  Blood pressure (!) 144/66, pulse 95, temperature 98.6 F (37 C), temperature source Oral, resp. rate (!) 25, height 4\' 10"  (1.473 m), weight 42.1 kg (92 lb 13 oz), SpO2 96 %.  PHYSICAL EXAMINATION:   Physical Exam  GENERAL:  66 y.o.-year-old patient lying in the bed with no acute distress.  EYES: Pupils equal, round, reactive to light and accommodation. No scleral icterus. Extraocular muscles intact.  HEENT: Head atraumatic, normocephalic. Oropharynx and nasopharynx clear.  NECK:  Supple, no jugular venous distention. No thyroid enlargement, no tenderness.  LUNGS: Bilateral wheezing CARDIOVASCULAR: S1, S2 normal. No murmurs, rubs, or gallops.  ABDOMEN: Soft, nontender, nondistended. Bowel sounds present. No organomegaly or mass.  EXTREMITIES: No cyanosis, clubbing or edema b/l.    NEUROLOGIC: Cranial nerves II through XII are intact. No focal Motor or sensory deficits b/l.   PSYCHIATRIC: The patient is alert and awake. SKIN: No obvious rash, lesion, or ulcer.    LABORATORY PANEL:   CBC  Recent Labs Lab 04/30/17 0308  WBC 9.6  HGB 9.0*  HCT 27.6*  PLT 180   ------------------------------------------------------------------------------------------------------------------ Chemistries   Recent Labs Lab 04/29/17 1053 04/29/17 1200  04/30/17 2306  NA 138  --   < > 141  K 3.1*  --   < > 4.3  CL 96*  --   < > 107  CO2 31  --   < > 26  GLUCOSE 89  --   < > 140*  BUN 19  --   < > 10  CREATININE 1.04*  --   < > 0.71  CALCIUM 9.1  --   < > 8.8*  MG  --  1.9  --   --   AST 21  --   --   --   ALT 12*  --   --   --   ALKPHOS 115  --   --   --   BILITOT 0.6  --   --   --   < > = values in this interval not displayed. ------------------------------------------------------------------------------------------------------------------  Cardiac Enzymes  Recent Labs Lab 04/29/17 1053  TROPONINI <0.03   ------------------------------------------------------------------------------------------------------------------  RADIOLOGY:  Ct Head Wo Contrast  Result Date: 04/30/2017 CLINICAL DATA:  Unresponsive and not responding to verbal components. Altered mental status. EXAM: CT HEAD WITHOUT CONTRAST TECHNIQUE: Contiguous axial images were obtained from the base of the skull through the vertex without intravenous contrast. COMPARISON:  None. FINDINGS: Brain: Normal size ventricles. No intra-axial mass, hemorrhage or midline shift. No extra-axial fluid collections. Chronic stable mild-to-moderate small vessel ischemic disease of periventricular and subcortical white matter. No large vascular territory infarct. Vascular: Calcifications of the carotid siphons bilaterally. No hyperdense vessels. Skull: The skull fracture nor primary osseous lesions. Sinuses/Orbits: Intact orbits and globes bilaterally. No acute sinus disease. Clear mastoids. Other: None IMPRESSION: Chronic mild-to-moderate small vessel ischemic disease of periventricular and subcortical  white matter. No acute intracranial abnormality. Electronically Signed   By: Tollie Eth M.D.   On: 04/30/2017 22:39     ASSESSMENT AND PLAN:   * Left pneumonia with acute on chronic hypoxic respiratory failure and COPD exacerbation -IV steroids, Antibiotics - Scheduled Nebulizers - Inhalers -Wean O2 as tolerated - Appreciate pulmonary input  * Sepsis Improved  * Acute encephalopathy due to sepsis improving  * DVT prophylaxis Lovenox  Consult physical therapy. Wean oxygen. Possible discharge tomorrow.  We'll also consult palliative care due to recurrent admissions and worsening COPD.  Transfer to medical floor.  Discussed with Dr. Sung Amabile of ICU.  All the records are reviewed and case discussed with Care Management/Social Workerr. Management plans discussed with the patient, family and they are in agreement.  CODE STATUS: FULL CODE  DVT Prophylaxis: SCDs  TOTAL TIME TAKING CARE OF THIS PATIENT: 30 minutes.   POSSIBLE D/C IN 1-2 DAYS, DEPENDING ON CLINICAL CONDITION.  Milagros Loll R M.D on 05/01/2017 at 11:09 AM  Between 7am to 6pm - Pager - [phone]  After 6pm go to [email or web address] - password [email or web address] [facility]  [facility] [place] [facility]  Office  [phone]  CC: Primary care physician; Malva Limes, MD  Note: This dictation was prepared with Dragon dictation along with smaller phrase technology. Any transcriptional errors that result from this process are unintentional.

## 2017-05-01 NOTE — Progress Notes (Signed)
Patient transferred to room 103 via wheelchair. No complications during transfer. Patient spouse, [name] was notified of transfer.

## 2017-05-01 NOTE — Progress Notes (Signed)
Patient arrived from floor alert and oriented communicating needs normotensive on 2L via  per baseline. Patient up to bedside commode shortly after arrival onto the unit unable to void bladder scan showed >350 cc. Bincy NP notified orders for in and out cath received and carried out. 350 cc clear yellow urine out. Patient currently resting with eyes closed at this time. Will continue to monitor.

## 2017-05-01 NOTE — Progress Notes (Signed)
Inpatient Diabetes Program Recommendations  AACE/ADA: New Consensus Statement on Inpatient Glycemic Control (2015)  Target Ranges:  Prepandial:   less than 140 mg/dL      Peak postprandial:   less than 180 mg/dL (1-2 hours)      Critically ill patients:  140 - 180 mg/dL   Results for Sueanne MargaritaROBEY, Hadli GEORGE (MRN [id number]) as of 05/01/2017 12:00  Ref. Range 04/30/2017 07:48 04/30/2017 15:00 04/30/2017 15:48 04/30/2017 20:34 04/30/2017 20:52 05/01/2017 01:03 05/01/2017 07:14  Glucose-Capillary Latest Ref Range: 65 - 99 mg/dL 73 50 (L) 782194 (H) 956223 (H) 224 (H) 115 (H) 145 (H)   Review of Glycemic Control  Diabetes history: No Outpatient Diabetes medications: NA Current orders for Inpatient glycemic control: CBGs Q2H and QAM  Inpatient Diabetes Program Recommendations: Correction (SSI): While inpatient and ordered steroids, please consider ordering CBGs with Novolog 0-9 units TID with meals and Novolog 0-5 units QHS.  Thanks, Orlando PennerMarie Shooter Tangen, RN, MSN, CDE Diabetes Coordinator Inpatient Diabetes Program [phone] (Team Pager from 8am to 5pm)

## 2017-05-01 NOTE — Progress Notes (Signed)
Patient ID: Cheryl MargaritaMary George [name], female   DOB: Apr 28, 1951, 66 y.o.   MRN: [id number]   Called by nursing regarding ongoing lethargy. Patient was admitted with sepsis secondary to pneumonia and hypoglycemia. She has apparently been increasingly lethargic over the course of the day. She was conversing and eating earlier today. Her encephalopathy was attributed to hypoglycemia however since her blood sugars have corrected her lethargy has worsened. Rapid response was called due to unresponsiveness. Patient was found by nursing to open eyes and follow commands but was not responding. She was given a 500 cc bolus for hypotension however her other vital signs remained stable at that time. I ordered an ABG, BMP and stat head CT.  On my exam patient's vital signs were stable: Blood pressure was 107/42, heart rate 66, respirations 20, O2 sat was 100% on nasal cannula. Patient is extremely lethargic. She would open her eyes to voice stimulus, attempted state her name but could not follow commands. Was not conversive or alert. Heart was regular positive S1-S2, lungs reveal decreased breath sounds at bases. Abdomen soft nontender nondistended extremities showed no cyanosis clubbing or edema  ABG was reviewed pH 7.55 CO2 30 06/26/1960 HCO3 26.2 Sodium 141 potassium 4.3, 107 CO2 26 glucose 140 BUN 10 creatinine 0.71 Head CT revealed his chronic mild to moderate small vessel ischemic disease no acute intracranial abnormality  Plan: Patient's altered mental status and acute encephalopathy may be attributed to sepsis. Will transfer to ICU for critical care consult and further management. I discussed with Dr. Marchelle Gearingamaswamy and Lexine BatonBincy NP.  At this point we'll continue IV fluids, IV antibiotics and current medication regimen. Her vital signs remained stable. Her family has been informed of the plan.

## 2017-05-01 NOTE — Progress Notes (Signed)
Pt will be transferred to ICU per MD's orders.  Amber Rn received report.

## 2017-05-02 ENCOUNTER — Encounter: Payer: Self-pay | Admitting: *Deleted

## 2017-05-02 LAB — GLUCOSE, CAPILLARY
GLUCOSE-CAPILLARY: 100 mg/dL — AB (ref 65–99)
Glucose-Capillary: 117 mg/dL — ABNORMAL HIGH (ref 65–99)
Glucose-Capillary: 180 mg/dL — ABNORMAL HIGH (ref 65–99)

## 2017-05-02 LAB — HEMOGLOBIN A1C
Hgb A1c MFr Bld: 5.5 % (ref 4.8–5.6)
MEAN PLASMA GLUCOSE: 111 mg/dL

## 2017-05-02 MED ORDER — PREDNISONE 50 MG PO TABS: 50.0000 mg | ORAL_TABLET | Freq: Every day | ORAL | Status: DC

## 2017-05-02 MED ORDER — ALPRAZOLAM 0.5 MG PO TABS: 0.5000 mg | ORAL_TABLET | Freq: Three times a day (TID) | ORAL | 0 refills | Status: DC | PRN

## 2017-05-02 MED ORDER — PREDNISONE 20 MG PO TABS: 20.0000 mg | ORAL_TABLET | Freq: Every day | ORAL | 0 refills | Status: AC

## 2017-05-02 MED ORDER — LEVOFLOXACIN 500 MG PO TABS: 500.0000 mg | ORAL_TABLET | Freq: Every day | ORAL | 0 refills | Status: DC

## 2017-05-02 MED ORDER — TRAMADOL HCL 50 MG PO TABS: 100.0000 mg | ORAL_TABLET | Freq: Four times a day (QID) | ORAL | 0 refills | Status: DC | PRN

## 2017-05-02 MED ORDER — FENTANYL 50 MCG/HR TD PT72: 50.0000 ug | MEDICATED_PATCH | TRANSDERMAL | 0 refills | Status: DC

## 2017-05-02 NOTE — Clinical Social Work Note (Signed)
Clinical Social Work Assessment  Patient Details  Name: Cheryl [name] MRN: [id number] Date of Birth: [birth year]  Date of referral:  05/02/17               Reason for consult:  Facility Placement, Discharge Planning                Permission sought to share information with:  Family Supports Permission granted to share information::  Yes, Verbal Permission Granted  Name::     Anhthu Perdew  Agency::     Relationship::  husband  Contact Information:     Housing/Transportation Living arrangements for the past 2 months:  Single Family Home Source of Information:  Patient, Spouse Patient Interpreter Needed:  None Criminal Activity/Legal Involvement Pertinent to Current Situation/Hospitalization:  Yes Significant Relationships:  Adult Children, Spouse Lives with:  Spouse Do you feel safe going back to the place where you live?  Yes Need for family participation in patient care:  Yes (Comment)  Care giving concerns:  No care giving concerns identified.   Social Worker assessment / plan:  CSW met with pt and family to address consult for New SNF. CSW introduced herself and explained role of social work. CSW also explained the process of discharging to SNF. Pt's insurance, [employer], requires a prior [place]. PT is recommending SNF. Pt would like to go to [facility]. Pt's husband is in agreement with discharge plan.   CSW spoke with [facility], who made a bed offer and initiated auth. At time of note Josem Kaufmann is pending. CSW will continue to follow.   Employment status:  Retired Nurse, adult PT Recommendations:  [place] / Referral to community resources:  [place]  Patient/Family's Response to care:  Pt and husband were appreciative of CSW support.   Patient/Family's Understanding of and Emotional Response to Diagnosis, Current Treatment, and Prognosis:  Pt understands that she is in need of a STR prior to returning home.    Emotional Assessment Appearance:  Appears stated age Attitude/Demeanor/Rapport:   (Appropriate) Affect (typically observed):  Accepting, Adaptable, Pleasant Orientation:  Oriented to Situation, Oriented to  Time, Oriented to Self, Oriented to Place Alcohol / Substance use:  Not Applicable Psych involvement (Current and /or in the community):  No (Comment)  Discharge Needs  Concerns to be addressed:  Adjustment to Illness Readmission within the last 30 days:  No Current discharge risk:  Chronically ill Barriers to Discharge:  Continued Medical Work up   [employer], LCSW 05/02/2017, 4:01 PM

## 2017-05-02 NOTE — Evaluation (Signed)
Physical Therapy Evaluation Patient Details Name: Cheryl [name] MRN: [id number] DOB: January 05, 1951 Today's Date: 05/02/2017   History of Present Illness  Pt is a 66 y.o F admitted with dx of pna with sepsis, on 04/29/17. Of note, rapid response on 04/30/17; complication to acute admission with CCU stay, secondary to encephalopathy. Prior to admission, pt was ModI with ADL's, utilizing RW for ambulation. Pt reports significant Hx of falls stating, "I fall at least once a day." Pt PMH includes: chronic respiratory failure, COPD, and chronic back pain.   Clinical Impression  Pt is a pleasant 66 y.o. F, admitted to acute care for pna with sepsis. Pt is currently on contact guard precautions, secondary to MRSA. Pt performs bed mobility and tranfers with MinA and ambulation with CGA, progressing to MinA upon fatigue. Pt able to ambulate a total of 15 ft, requiring rest breaks after ~5 ft. Pt able to maintain O2 sats above 90% at rest, however, sats drop to ~85% upon exertion. Pt requires 2L supplemental O2 with transfers/ambulation. Pt presents with the following deficits: generalized muscle weakness and endurance. Pt would benefit from skilled PT to address the previously mentioned impairments and promote return to PLOF; recommended SNF upon d/c.     Follow Up Recommendations SNF    Equipment Recommendations  None recommended by PT    Recommendations for Other Services       Precautions / Restrictions Precautions Precautions: Other (comment) Precaution Comments: Contact precuations upon entering pt room, secondary to pt dx of MRSA.      Mobility  Bed Mobility Overal bed mobility: Needs Assistance Bed Mobility: Supine to Sit     Supine to sit: Supervision     General bed mobility comments: Supervision for all bed mobility, secondary to generalized weakness; requires verbal cuing for task sequencing.   Transfers Overall transfer level: Needs assistance Equipment used: Rolling walker (2  wheeled) Transfers: Sit to/from Stand Sit to Stand: Min assist         General transfer comment: Pt MinA with STS transfers, secondary to LE weakness. Requires set-up assist with RW, and verbal cues on hand placement.   Ambulation/Gait Ambulation/Gait assistance: Min guard;Min assist Ambulation Distance (Feet): 15 Feet Assistive device: Rolling walker (2 wheeled) Gait Pattern/deviations: Step-through pattern;Decreased step length - right;Decreased step length - left     General Gait Details: Impaired step length and gait speed. Significant kyphotic posture with ambulation (likely due to PMH of lumbar facet radiofrequeny ablation). Requires 2L supplemenmtal O2 during ambulation.  Stairs            Wheelchair Mobility    Modified Rankin (Stroke Patients Only)       Balance Overall balance assessment: Needs assistance Sitting-balance support: Bilateral upper extremity supported;Feet supported   Sitting balance - Comments: Kyphotic posture and dizzy upon sitting    Standing balance support: Bilateral upper extremity supported (RW)                                 Pertinent Vitals/Pain Pain Assessment: 0-10 Pain Score: 5  Pain Location: legs  Pain Descriptors / Indicators: Dull;Aching (No pain at rest; increased with amb) Pain Intervention(s): Limited activity within patient's tolerance;Monitored during session;Repositioned    Home Living Family/patient expects to be discharged to:: Skilled nursing facility Living Arrangements: Group Home                    Prior Function  Level of Independence: Needs assistance   Gait / Transfers Assistance Needed: Ind with household ambulation using RW. Reports daily falls.            Hand Dominance   Dominant Hand: Right    Extremity/Trunk Assessment   Upper Extremity Assessment Upper Extremity Assessment: Generalized weakness;RUE deficits/detail;LUE deficits/detail (Grossly 4/5 with B elbow  flex/ext)    Lower Extremity Assessment Lower Extremity Assessment: Generalized weakness (Grossly 4/5 B LE knee quads and ankle DF; 3/5 B hams)       Communication   Communication: No difficulties  Cognition Arousal/Alertness: Awake/alert Behavior During Therapy: WFL for tasks assessed/performed Overall Cognitive Status: Within Functional Limits for tasks assessed                                        General Comments      Exercises Other Exercises Other Exercises: Therex to B LE's in supine position with supervision-CGA; 10 reps of the following: SLR, ankle pumps, and abd. Pt intermittently required tactile cues to maintain good body mechanics with therex. Pt education re: pursed lipped breathing to maintain O2 sats on room air with exersion.   Assessment/Plan    PT Assessment Patient needs continued PT services  PT Problem List         PT Treatment Interventions      PT Goals (Current goals can be found in the Care Plan section)  Acute Rehab PT Goals Patient Stated Goal: To be able to walk again. PT Goal Formulation: With patient Time For Goal Achievement: 05/16/17 Potential to Achieve Goals: Good Additional Goals Additional Goal #1: Pt to perform all bed mobility tasks with ModI with least restrictive AD    Frequency Min 2X/week   Barriers to discharge        Co-evaluation               AM-PAC PT "6 Clicks" Daily Activity  Outcome Measure Difficulty turning over in bed (including adjusting bedclothes, sheets and blankets)?: A Lot Difficulty moving from lying on back to sitting on the side of the bed? : A Lot Difficulty sitting down on and standing up from a chair with arms (e.g., wheelchair, bedside commode, etc,.)?: A Lot Help needed moving to and from a bed to chair (including a wheelchair)?: A Little Help needed walking in hospital room?: A Little Help needed climbing 3-5 steps with a railing? : Total 6 Click Score: 13    End of  Session Equipment Utilized During Treatment: Gait belt Activity Tolerance: Patient limited by fatigue;Patient limited by pain Patient left: in bed;with call bell/phone within reach;with bed alarm set Nurse Communication: Mobility status PT Visit Diagnosis: Other abnormalities of gait and mobility (R26.89);Unsteadiness on feet (R26.81);Repeated falls (R29.6);Muscle weakness (generalized) (M62.81);History of falling (Z91.81)    Time: 1610-9604 PT Time Calculation (min) (ACUTE ONLY): 34 min   Charges:         PT G Codes:        Sharman Cheek PT, SPT  Latanya Maudlin 05/02/2017, 1:30 PM

## 2017-05-02 NOTE — Plan of Care (Signed)
Problem: Acute Rehab PT Goals(only PT should resolve) Goal: Patient Will Transfer Sit To/From Stand Outcome: Progressing with least restrictive AD for greater independence and reduced care giver burden. Goal: Pt Will Transfer Bed To Chair/Chair To Bed Outcome: Progressing with least restrictive AD to enhance pt independence and reduce care giver burden. Goal: Pt Will Ambulate Outcome: Progressing For greater independence in the home. Goal: PT Additional Goal #1 Outcome: Progressing to improve functional independence.

## 2017-05-02 NOTE — Care Management Note (Signed)
Case Management Note  Patient Details  Name: Cheryl [name] MRN: [id number] Date of Birth: 1951/03/22  Subjective/Objective:         [facility] with [facility] of [facility]. Lives at home with husband, [name] [phone]). Last seen Dr. Sherrie MustacheFisher in May. Takes care of all basic activities of daily living herself, can drive if needed, [facility] [place]. Shower chair, raised toilet seat, rolling walker, cane, wheelchair, and hoover around in the home. Home Health in February. Doesn't remember name of agency. Home oxygen x 2 years. Uses 2 liters  Per nasal cannula PRN          Action/Plan:   Physical therapy evaluation pending. [facility] 12/25/16   Expected Discharge Date:  05/01/17               Expected Discharge Plan:     In-House Referral:     Discharge planning Services     Post Acute Care Choice:    Choice offered to:     DME Arranged:    DME Agency:     HH Arranged:    HH Agency:     Status of Service:     If discussed at [employer] Meetings, dates discussed:    Additional Comments:  Gwenette GreetBrenda S Lamoine Fredricksen, RN [facility] Care Management  [phone] 05/02/2017, 8:16 AM

## 2017-05-02 NOTE — Progress Notes (Signed)
Pt being discharged home with home health, discharge instructions and prescriptions reviewed with pt and husband, states understanding, pt with no complaints

## 2017-05-02 NOTE — NC FL2 (Signed)
[place] MEDICAID FL2 LEVEL OF CARE SCREENING TOOL     IDENTIFICATION  Patient Name: Cheryl [name] Birthdate: 03/16/1951 Sex: female Admission Date (Current Location): 04/29/2017  [place]   Facility and Address:  [facility], [address], [place], [place] [address]      Provider Number: [id number]  Attending Physician Name and Address:  Milagros LollSudini, Srikar, MD  Relative Name and Phone Number:       Current Level of Care: Hospital Recommended Level of Care: Skilled Nursing Facility Prior Approval Number:    Date Approved/Denied:   PASRR Number: [id number]  Discharge Plan: SNF    Current Diagnoses: Patient Active Problem List   Diagnosis Date Noted  . Pneumonia 04/29/2017  . Acute postoperative pain 04/14/2017  . Metabolic encephalopathy 12/23/2016  . Hypokalemia 12/23/2016  . Protein-calorie malnutrition, severe 11/06/2016  . Nausea with vomiting   . Lumbar facet syndrome (Location of Primary Source of Pain) (Bilateral) (L>R) 03/20/2016  . Lumbar spondylosis 03/20/2016  . Opiate use (160 MME/Day) 02/28/2016  . Anemia 02/28/2016  . Osteoarthrosis 12/18/2015  . Long term current use of anticoagulant therapy (Plavix) 12/18/2015  . Long term current use of opiate analgesic 12/04/2015  . Fibromyalgia 12/04/2015  . Dysphagia 11/24/2015  . Acute diastolic CHF (congestive heart failure) (HCC) 11/24/2015  . Leukocytosis 11/24/2015  . Sepsis (HCC) 10/17/2015  . Abnormal mammogram of right breast 10/11/2015  . Dilated intrahepatic bile duct 10/11/2015  . Lumbar radicular pain (Bilateral) (L>R) (L4) 09/04/2015  . Chronic low back pain (Location of Primary Source of Pain) (Bilateral) (L>R) 09/04/2015  . Encounter for therapeutic drug level monitoring 09/04/2015  . Uncomplicated opioid dependence (HCC) 09/04/2015  . Chronic pain syndrome 09/04/2015  . Platelet inhibition due to Plavix 09/04/2015  . COPD (chronic  obstructive pulmonary disease) (HCC) 07/31/2015  . Dementia 07/31/2015  . Airway hyperreactivity 07/03/2015  . Back pain, thoracic 07/03/2015  . Excessive falling 07/03/2015  . Alteration in bowel elimination: incontinence 07/03/2015  . Insomnia 07/03/2015  . Decreased testosterone level 07/03/2015  . Leg weakness 07/03/2015  . Menopausal symptom 07/03/2015  . Migraine 07/03/2015  . Neuropathy (HCC) 07/03/2015  . Fecal occult blood test positive 07/03/2015  . OP (osteoporosis) 07/03/2015  . Panic disorder 07/03/2015  . Compulsive tobacco user syndrome 07/03/2015  . Urinary incontinence 07/03/2015  . Weight loss 07/03/2015  . Essential hypertension 06/06/2015  . GERD (gastroesophageal reflux disease) 06/06/2015  . Hyperlipemia 06/06/2015  . Peripheral nerve disease 04/13/2014  . Anxiety 02/10/2014  . Coronary artery disease 02/10/2014  . Hypercholesteremia 02/10/2014  . Peripheral vascular disease (HCC) 02/10/2014  . Vitamin D deficiency 10/16/2009    Orientation RESPIRATION BLADDER Height & Weight     Time, Situation, Place, Self  O2 (2L) Continent Weight: 91 lb 8 oz (41.5 kg) Height:  4\' 10"  (147.3 cm)  BEHAVIORAL SYMPTOMS/MOOD NEUROLOGICAL BOWEL NUTRITION STATUS      Continent Diet (Heart Healthy, Thin Liquids)  AMBULATORY STATUS COMMUNICATION OF NEEDS Skin   Limited Assist Verbally Normal                       Personal Care Assistance Level of Assistance  Bathing, Feeding, Dressing Bathing Assistance: Limited assistance Feeding assistance: Independent       Functional Limitations Info  Sight, Hearing, Speech Sight Info: Adequate Hearing Info: Adequate Speech Info: Adequate    SPECIAL CARE FACTORS FREQUENCY  PT (By licensed PT)  PT Frequency: 5              Contractures Contractures Info: Not present    Additional Factors Info  Code Status, Allergies Code Status Info: Full Code Allergies Info: Percocet Oxycodone-acetaminophen, Aspirin,  Codeine, Propoxyphene, Sulfa Antibiotics           Current Medications (05/02/2017):  This is the current hospital active medication list Current Facility-Administered Medications  Medication Dose Route Frequency Provider Last Rate Last Dose  . acetaminophen (TYLENOL) tablet 650 mg  650 mg Oral Q6H PRN Katha Hamming, MD       Or  . acetaminophen (TYLENOL) suppository 650 mg  650 mg Rectal Q6H PRN Katha Hamming, MD   650 mg at 04/30/17 1747  . albuterol (PROVENTIL) (2.5 MG/3ML) 0.083% nebulizer solution 2.5 mg  2.5 mg Nebulization Q4H PRN Katha Hamming, MD      . ALPRAZolam Prudy Feeler) tablet 0.5 mg  0.5 mg Oral TID PRN Katha Hamming, MD      . azithromycin (ZITHROMAX) tablet 500 mg  500 mg Oral Daily Milagros Loll, MD   500 mg at 05/02/17 0746  . benzonatate (TESSALON) capsule 100 mg  100 mg Oral TID PRN Katha Hamming, MD      . bisacodyl (DULCOLAX) EC tablet 5 mg  5 mg Oral Daily PRN Katha Hamming, MD      . cefTRIAXone (ROCEPHIN) 1 g in dextrose 5 % 50 mL IVPB  1 g Intravenous Q24H Katha Hamming, MD   Stopped at 05/02/17 0815  . Chlorhexidine Gluconate Cloth 2 % PADS 6 each  6 each Topical Q0600 Varughese, Bincy S, NP   6 each at 05/02/17 0516  . clopidogrel (PLAVIX) tablet 75 mg  75 mg Oral Daily Milagros Loll, MD   75 mg at 05/02/17 0746  . cyclobenzaprine (FLEXERIL) tablet 5 mg  5 mg Oral TID PRN Katha Hamming, MD   5 mg at 04/30/17 1202  . docusate sodium (COLACE) capsule 100 mg  100 mg Oral BID Katha Hamming, MD   100 mg at 05/02/17 0746  . donepezil (ARICEPT) tablet 10 mg  10 mg Oral QHS Katha Hamming, MD   10 mg at 05/01/17 2204  . enoxaparin (LOVENOX) injection 30 mg  30 mg Subcutaneous Q24H Milagros Loll, MD   30 mg at 05/01/17 2204  . estropipate (OGEN) tablet 0.75 mg  0.75 mg Oral Kate Sable, MD   0.75 mg at 05/02/17 0746  . fentaNYL (DURAGESIC - dosed mcg/hr) 50 mcg  50 mcg Transdermal Q72H  Katha Hamming, MD   50 mcg at 05/02/17 0746  . fluticasone (FLONASE) 50 MCG/ACT nasal spray 2 spray  2 spray Each Nare Daily Katha Hamming, MD   2 spray at 05/02/17 0803  . insulin aspart (novoLOG) injection 0-9 Units  0-9 Units Subcutaneous TID WC Milagros Loll, MD   1 Units at 05/01/17 1703  . MEDLINE mouth rinse  15 mL Mouth Rinse q12n4p Katha Hamming, MD   15 mL at 05/01/17 1704  . methylPREDNISolone sodium succinate (SOLU-MEDROL) 125 mg/2 mL injection 60 mg  60 mg Intravenous BID Milagros Loll, MD   60 mg at 05/02/17 0745  . mometasone-formoterol (DULERA) 200-5 MCG/ACT inhaler 2 puff  2 puff Inhalation BID Katha Hamming, MD   2 puff at 05/02/17 0748  . morphine 2 MG/ML injection 2 mg  2 mg Intravenous Q4H PRN Katha Hamming, MD      . mupirocin ointment (BACTROBAN) 2 % 1 application  1  application Nasal BID Gwendolyn Fill, NP   1 application at 05/02/17 0747  . nicotine (NICODERM CQ - dosed in mg/24 hr) patch 7 mg  7 mg Transdermal Daily Varughese, Bincy S, NP      . ondansetron (ZOFRAN) tablet 4 mg  4 mg Oral Q6H PRN Katha Hamming, MD       Or  . ondansetron (ZOFRAN) injection 4 mg  4 mg Intravenous Q6H PRN Katha Hamming, MD   4 mg at 05/01/17 1115  . pantoprazole (PROTONIX) EC tablet 40 mg  40 mg Oral BID AC Katha Hamming, MD   40 mg at 05/02/17 0746  . pregabalin (LYRICA) capsule 300 mg  300 mg Oral BID Katha Hamming, MD   300 mg at 05/02/17 0746  . tiotropium (SPIRIVA) inhalation capsule 18 mcg  18 mcg Inhalation Daily Katha Hamming, MD   18 mcg at 05/02/17 0804  . traMADol (ULTRAM) tablet 100 mg  100 mg Oral Q6H PRN Katha Hamming, MD   100 mg at 05/02/17 0746  . traZODone (DESYREL) tablet 25 mg  25 mg Oral QHS PRN Katha Hamming, MD         Discharge Medications: Please see discharge summary for a list of discharge medications.  Relevant Imaging Results:  Relevant Lab Results:   Additional  Information SSN:  [id number]  Contact isolation  Dede Query, LCSW

## 2017-05-02 NOTE — Progress Notes (Signed)
Patient and family requesting to go home instead of SNF. Will order home health services and palliative to follow after discharge.

## 2017-05-02 NOTE — Progress Notes (Signed)
[employer] [facility] [place] [facility]   PATIENT NAME: Cheryl [name]    MR#:  [id number]  DATE OF BIRTH:  October 27, 1951  SUBJECTIVE:  CHIEF COMPLAINT:   Chief Complaint  Patient presents with  . Weakness  . Loss of Consciousness   SOB and cough improving. Still weak  Daughter at bedside.  REVIEW OF SYSTEMS:    Review of Systems  Unable to perform ROS: Mental status change    DRUG ALLERGIES:   Allergies  Allergen Reactions  . Percocet [Oxycodone-Acetaminophen] Hives and Rash  . Aspirin Nausea And Vomiting and Other (See Comments)    Reaction:  GI upset   . Codeine Nausea And Vomiting and Other (See Comments)    Reaction:  GI upset   . Propoxyphene Other (See Comments) and Nausea Only    GI upset Reaction:  GI upset   . Sulfa Antibiotics Rash and Other (See Comments)    Reaction:  GI upset     VITALS:  Blood pressure (!) 120/53, pulse 69, temperature 98.4 F (36.9 C), temperature source Oral, resp. rate 20, height 4\' 10"  (1.473 m), weight 41.5 kg (91 lb 8 oz), SpO2 97 %.  PHYSICAL EXAMINATION:   Physical Exam  GENERAL:  66 y.o.-year-old patient lying in the bed with no acute distress.  EYES: Pupils equal, round, reactive to light and accommodation. No scleral icterus. Extraocular muscles intact.  HEENT: Head atraumatic, normocephalic. Oropharynx and nasopharynx clear.  NECK:  Supple, no jugular venous distention. No thyroid enlargement, no tenderness.  LUNGS: Mild Bilateral wheezing CARDIOVASCULAR: S1, S2 normal. No murmurs, rubs, or gallops.  ABDOMEN: Soft, nontender, nondistended. Bowel sounds present. No organomegaly or mass.  EXTREMITIES: No cyanosis, clubbing or edema b/l.    NEUROLOGIC: Cranial nerves II through XII are intact. No focal Motor or sensory deficits b/l.   PSYCHIATRIC: The patient is alert and awake. SKIN: No obvious rash, lesion, or ulcer.   LABORATORY PANEL:   CBC  Recent Labs Lab 04/30/17 0308  WBC 9.6  HGB 9.0*  HCT  27.6*  PLT 180   ------------------------------------------------------------------------------------------------------------------ Chemistries   Recent Labs Lab 04/29/17 1053 04/29/17 1200  04/30/17 2306  NA 138  --   < > 141  K 3.1*  --   < > 4.3  CL 96*  --   < > 107  CO2 31  --   < > 26  GLUCOSE 89  --   < > 140*  BUN 19  --   < > 10  CREATININE 1.04*  --   < > 0.71  CALCIUM 9.1  --   < > 8.8*  MG  --  1.9  --   --   AST 21  --   --   --   ALT 12*  --   --   --   ALKPHOS 115  --   --   --   BILITOT 0.6  --   --   --   < > = values in this interval not displayed. ------------------------------------------------------------------------------------------------------------------  Cardiac Enzymes  Recent Labs Lab 04/29/17 1053  TROPONINI <0.03   ------------------------------------------------------------------------------------------------------------------  RADIOLOGY:  Ct Head Wo Contrast  Result Date: 04/30/2017 CLINICAL DATA:  Unresponsive and not responding to verbal components. Altered mental status. EXAM: CT HEAD WITHOUT CONTRAST TECHNIQUE: Contiguous axial images were obtained from the base of the skull through the vertex without intravenous contrast. COMPARISON:  None. FINDINGS: Brain: Normal size ventricles. No intra-axial mass, hemorrhage or midline  shift. No extra-axial fluid collections. Chronic stable mild-to-moderate small vessel ischemic disease of periventricular and subcortical white matter. No large vascular territory infarct. Vascular: Calcifications of the carotid siphons bilaterally. No hyperdense vessels. Skull: The skull fracture nor primary osseous lesions. Sinuses/Orbits: Intact orbits and globes bilaterally. No acute sinus disease. Clear mastoids. Other: None IMPRESSION: Chronic mild-to-moderate small vessel ischemic disease of periventricular and subcortical white matter. No acute intracranial abnormality. Electronically Signed   By: Tollie Eth M.D.    On: 04/30/2017 22:39   ASSESSMENT AND PLAN:   * Left pneumonia with acute on chronic hypoxic respiratory failure and COPD exacerbation -IV steroids, Antibiotics - Scheduled Nebulizers - Inhalers - Appreciate pulmonary input Change to prednisone  * Sepsis Improved  * Acute encephalopathy due to sepsis - resolved  * DVT prophylaxis Lovenox  SNF at discharge.  All the records are reviewed and case discussed with Care Management/Social Workerr. Management plans discussed with the patient, family and they are in agreement.  CODE STATUS: FULL CODE  DVT Prophylaxis: SCDs  TOTAL TIME TAKING CARE OF THIS PATIENT: 30 minutes.   POSSIBLE D/C IN 1-2 DAYS, DEPENDING ON CLINICAL CONDITION.  Milagros Loll R M.D on 05/02/2017 at 12:37 PM  Between 7am to 6pm - Pager - [phone]  After 6pm go to [email or web address] - password [email or web address] [facility]  [facility] [place] [facility]  Office  [phone]  CC: Primary care physician; Malva Limes, MD  Note: This dictation was prepared with Dragon dictation along with smaller phrase technology. Any transcriptional errors that result from this process are unintentional.

## 2017-05-02 NOTE — Clinical Social Work Placement (Signed)
   CLINICAL SOCIAL WORK PLACEMENT  NOTE  Date:  05/02/2017  Patient Details  Name: Sueanne MargaritaMary George Abshier MRN: [id number] Date of Birth: 1951/11/23  Clinical Social Work is seeking post-discharge placement for this patient at the Skilled  Nursing Facility level of care (*CSW will initial, date and re-position this form in  chart as items are completed):  Yes   Patient/family provided with [place] Clinical Social Work Department's list of facilities offering this level of care within the geographic area requested by the patient (or if unable, by the patient's family).  Yes   Patient/family informed of their freedom to choose among providers that offer the needed level of care, that participate in Medicare, Medicaid or managed care program needed by the patient, have an available bed and are willing to accept the patient.  Yes   Patient/family informed of [facility]'s ownership interest in [facility] and [facility], as well as of the fact that they are under no obligation to receive care at these facilities.  PASRR submitted to EDS on       PASRR number received on       Existing PASRR number confirmed on 05/02/17     FL2 transmitted to all facilities in geographic area requested by pt/family on 05/02/17     FL2 transmitted to all facilities within larger geographic area on       Patient informed that his/her managed care company has contracts with or will negotiate with certain facilities, including the following:        Yes   Patient/family informed of bed offers received.  [facility]     Physician recommends and patient chooses bed at      [facility]  .  Patient to be transferred to facility by Pt's husband     Patient family notified on   of transfer.  Name of family member notified:  Pt's husband     PHYSICIAN       Additional Comment:     _______________________________________________ Dede QuerySarah Keiera Strathman, LCSW 05/02/2017, 3:55 PM

## 2017-05-02 NOTE — Care Management Important Message (Signed)
Important Message  Patient Details  Name: Cheryl [name] MRN: [id number] Date of Birth: 07/11/1951   Medicare Important Message Given:  Yes    Gwenette GreetBrenda S Pat Elicker, RN 05/02/2017, 8:06 AM

## 2017-05-02 NOTE — Progress Notes (Signed)
New referral for palliative services to follow after discharge received from Vennie HomansMegan Mason, Palliative Medicine NP following discharge. Hospital care team aware. SNF authorization pending per CSW Dede QuerySarah McNulty. Referral made aware. Thank you. Dayna BarkerKaren Robertson RN, BSN, [facility], hospital liaison [phone] c

## 2017-05-02 NOTE — Discharge Summary (Addendum)
[employer] [facility] -  at [facility]   PATIENT NAME: Cheryl [name]    MR#:  [id number]  DATE OF BIRTH:  06-27-1951  DATE OF ADMISSION:  04/29/2017 ADMITTING PHYSICIAN: Katha Hamming, MD  DATE OF DISCHARGE: No discharge date for patient encounter.  PRIMARY CARE PHYSICIAN: Malva Limes, MD   ADMISSION DIAGNOSIS:  Sepsis, due to unspecified organism [facility]) [A41.9] Community acquired pneumonia of left lung, unspecified part of lung [J18.9]  DISCHARGE DIAGNOSIS:  Active Problems:   Pneumonia   SECONDARY DIAGNOSIS:   Past Medical History:  Diagnosis Date  . Allergy   . Anxiety   . Arthritis   . Aspiration pneumonitis (HCC) 11/24/2015  . Asthma   . Chronic pain   . COPD (chronic obstructive pulmonary disease) (HCC)   . Coronary artery disease   . DVT (deep venous thrombosis) (HCC)   . GERD (gastroesophageal reflux disease)   . Headache   . Hyperlipidemia   . Hypertension   . Migraines   . Neuropathy 2010  . Osteoporosis   . Oxygen deficiency   . Peripheral vascular disease (HCC)   . Pneumonia   . Pneumonia 11/19/2015  . Pneumonia 10/2016  . Vitamin D deficiency      ADMITTING HISTORY  HISTORY OF PRESENT ILLNESS:  Cheryl [name]  is a 66 y.o. female with a known history of Chronic respiratory failure and on oxygen 2 L secondary to severe COPD, chronic back pain followed by pain management clinic and on multiple pain medicines, brought in because of altered mental status. Blood sugar found to be 56 when she came. Temp was 103 Fahrenheit, she also had vomiting at home, chest x-ray showed side pneumonia. Patient received dextrose containing IV fluids, IV Rocephin, Zithromax, IV tenderness or fever. When she came she was lethargic but by the time I  Went to  Take H&P she is completely alert awake and oriented and asking for pain medicines and leg pain. I reviewed her pain management doctor note and she had lumbar facet radiofrequency ablation on May 21 and she  is advised to stay away from blood thinners 1 week after surgery.   HOSPITAL COURSE:    * Left pneumonia with acute on chronic hypoxic respiratory failure and COPD exacerbation -IV steroids, Antibiotics in hospital. Change to prednisone and PO levaquin at discharge - Nebulizers, Inhalers - Appreciate pulmonary input Improving. Oxygen PRN  * Sepsis Improved  * Acute encephalopathy due to sepsis - resolved  * DVT prophylaxis Lovenox  SNF at discharge.  Palliative care to follow at the nursing home  CONSULTS OBTAINED:    DRUG ALLERGIES:   Allergies  Allergen Reactions  . Percocet [Oxycodone-Acetaminophen] Hives and Rash  . Aspirin Nausea And Vomiting and Other (See Comments)    Reaction:  GI upset   . Codeine Nausea And Vomiting and Other (See Comments)    Reaction:  GI upset   . Propoxyphene Other (See Comments) and Nausea Only    GI upset Reaction:  GI upset   . Sulfa Antibiotics Rash and Other (See Comments)    Reaction:  GI upset     DISCHARGE MEDICATIONS:   Current Discharge Medication List    START taking these medications   Details  levofloxacin (LEVAQUIN) 500 MG tablet Take 1 tablet (500 mg total) by mouth daily. Qty: 3 tablet, Refills: 0    predniSONE (DELTASONE) 20 MG tablet Take 1 tablet (20 mg total) by mouth daily with breakfast. Qty: 4 tablet, Refills: 0  CONTINUE these medications which have CHANGED   Details  ALPRAZolam (XANAX) 0.5 MG tablet Take 1 tablet (0.5 mg total) by mouth 3 (three) times daily as needed for anxiety. Qty: 15 tablet, Refills: 0    fentaNYL (DURAGESIC - DOSED MCG/HR) 50 MCG/HR Place 1 patch (50 mcg total) onto the skin every 3 (three) days. Qty: 5 patch, Refills: 0   Associated Diagnoses: Chronic pain syndrome    traMADol (ULTRAM) 50 MG tablet Take 2 tablets (100 mg total) by mouth every 6 (six) hours as needed for moderate pain or severe pain. Qty: 20 tablet, Refills: 0   Associated Diagnoses: Chronic pain  syndrome      CONTINUE these medications which have NOT CHANGED   Details  acetaminophen (TYLENOL) 325 MG tablet Take 2 tablets (650 mg total) by mouth every 6 (six) hours as needed for mild pain (or Fever >/= 101). Qty: 30 tablet, Refills: 0    ADVAIR DISKUS 250-50 MCG/DOSE AEPB inhale 1 dose by mouth twice a day Qty: 60 each, Refills: 11    albuterol (PROVENTIL HFA;VENTOLIN HFA) 108 (90 BASE) MCG/ACT inhaler Inhale 2 puffs into the lungs every 4 (four) hours as needed for wheezing or shortness of breath. Qty: 18 g, Refills: 3    albuterol (PROVENTIL) (2.5 MG/3ML) 0.083% nebulizer solution Take 3 mLs (2.5 mg total) by nebulization every 4 (four) hours as needed for wheezing. Qty: 75 mL, Refills: 12    celecoxib (CELEBREX) 100 MG capsule Take 1 capsule (100 mg total) by mouth 2 (two) times daily as needed. Qty: 60 capsule, Refills: 1   Associated Diagnoses: Pain of right lower extremity    clopidogrel (PLAVIX) 75 MG tablet Take 75 mg by mouth daily.    diphenoxylate-atropine (LOMOTIL) 2.5-0.025 MG per tablet Take 2 tablets by mouth 4 (four) times daily as needed for diarrhea or loose stools. Reported on 03/11/2016    donepezil (ARICEPT) 10 MG tablet Take 10 mg by mouth at bedtime.    estropipate (OGEN) 0.75 MG tablet Take 1 tablet (0.75 mg total) by mouth every other day. Qty: 30 tablet, Refills: 3    furosemide (LASIX) 40 MG tablet Take 40 mg by mouth daily.     LYRICA 300 MG capsule Take 300 mg by mouth 2 (two) times daily. Refills: 0    pantoprazole (PROTONIX) 40 MG tablet take 1 tablet by mouth twice a day Qty: 60 tablet, Refills: 12    simvastatin (ZOCOR) 10 MG tablet Take 10 mg by mouth at bedtime. Reported on 12/04/2015    SPIRIVA HANDIHALER 18 MCG inhalation capsule inhale the contents of one capsule in the handihaler once daily Qty: 30 capsule, Refills: 12    SUMAtriptan (IMITREX) 25 MG tablet Take 25 mg by mouth as needed for migraine. May repeat in 2 hours if  headache persists or recurs.    benzonatate (TESSALON PERLES) 100 MG capsule Take 1 capsule (100 mg total) by mouth 3 (three) times daily as needed for cough. Qty: 20 capsule, Refills: 1    triamcinolone ointment (KENALOG) 0.5 % Apply to lesions on hands twice a day as needed Qty: 30 g, Refills: 1   Associated Diagnoses: Dyshidrotic eczema      STOP taking these medications     cyclobenzaprine (FLEXERIL) 5 MG tablet      fluticasone (FLONASE) 50 MCG/ACT nasal spray         Today   VITAL SIGNS:  Blood pressure (!) 120/53, pulse 69, temperature 98.4 F (36.9  C), temperature source Oral, resp. rate 20, height 4\' 10"  (1.473 m), weight 41.5 kg (91 lb 8 oz), SpO2 97 %.  I/O:   Intake/Output Summary (Last 24 hours) at 05/02/17 1240 Last data filed at 05/01/17 1348  Gross per 24 hour  Intake              240 ml  Output                0 ml  Net              240 ml    PHYSICAL EXAMINATION:  Physical Exam  GENERAL:  66 y.o.-year-old patient lying in the bed with no acute distress.  LUNGS: Normal breath sounds bilaterally, no wheezing, rales,rhonchi or crepitation. No use of accessory muscles of respiration.  CARDIOVASCULAR: S1, S2 normal. No murmurs, rubs, or gallops.  ABDOMEN: Soft, non-tender, non-distended. Bowel sounds present. No organomegaly or mass.  NEUROLOGIC: Moves all 4 extremities. PSYCHIATRIC: The patient is alert and oriented x 3.  SKIN: No obvious rash, lesion, or ulcer.   DATA REVIEW:   CBC  Recent Labs Lab 04/30/17 0308  WBC 9.6  HGB 9.0*  HCT 27.6*  PLT 180    Chemistries   Recent Labs Lab 04/29/17 1053 04/29/17 1200  04/30/17 2306  NA 138  --   < > 141  K 3.1*  --   < > 4.3  CL 96*  --   < > 107  CO2 31  --   < > 26  GLUCOSE 89  --   < > 140*  BUN 19  --   < > 10  CREATININE 1.04*  --   < > 0.71  CALCIUM 9.1  --   < > 8.8*  MG  --  1.9  --   --   AST 21  --   --   --   ALT 12*  --   --   --   ALKPHOS 115  --   --   --   BILITOT 0.6   --   --   --   < > = values in this interval not displayed.  Cardiac Enzymes  Recent Labs Lab 04/29/17 1053  TROPONINI <0.03    Microbiology Results  Results for orders placed or performed during the hospital encounter of 04/29/17  CULTURE, BLOOD (ROUTINE X 2) w Reflex to ID Panel     Status: None (Preliminary result)   Collection Time: 04/29/17 10:53 AM  Result Value Ref Range Status   Specimen Description BLOOD L AC  Final   Special Requests   Final    BOTTLES DRAWN AEROBIC AND ANAEROBIC Blood Culture results may not be optimal due to an excessive volume of blood received in culture bottles   Culture NO GROWTH 3 DAYS  Final   Report Status PENDING  Incomplete  Urine culture     Status: None   Collection Time: 04/29/17 10:53 AM  Result Value Ref Range Status   Specimen Description URINE, RANDOM  Final   Special Requests NONE  Final   Culture   Final    NO GROWTH Performed at [facility], [address] [place], [place] [address]    Report Status 04/30/2017 FINAL  Final  CULTURE, BLOOD (ROUTINE X 2) w Reflex to ID Panel     Status: None (Preliminary result)   Collection Time: 04/29/17 11:30 AM  Result Value Ref Range Status   Specimen  Description BLOOD R ARM  Final   Special Requests   Final    BOTTLES DRAWN AEROBIC AND ANAEROBIC Blood Culture adequate volume   Culture NO GROWTH 3 DAYS  Final   Report Status PENDING  Incomplete  MRSA PCR Screening     Status: Abnormal   Collection Time: 05/01/17  1:08 AM  Result Value Ref Range Status   MRSA by PCR POSITIVE (A) NEGATIVE Final    Comment:        The GeneXpert MRSA Assay (FDA approved for NASAL specimens only), is one component of a comprehensive MRSA colonization surveillance program. It is not intended to diagnose MRSA infection nor to guide or monitor treatment for MRSA infections. CRITICAL RESULT CALLED TO, READ BACK BY AND VERIFIED WITH: AMBER COLLINS AT 0229 ON 05/01/2017 JJB     RADIOLOGY:  Ct  Head Wo Contrast  Result Date: 04/30/2017 CLINICAL DATA:  Unresponsive and not responding to verbal components. Altered mental status. EXAM: CT HEAD WITHOUT CONTRAST TECHNIQUE: Contiguous axial images were obtained from the base of the skull through the vertex without intravenous contrast. COMPARISON:  None. FINDINGS: Brain: Normal size ventricles. No intra-axial mass, hemorrhage or midline shift. No extra-axial fluid collections. Chronic stable mild-to-moderate small vessel ischemic disease of periventricular and subcortical white matter. No large vascular territory infarct. Vascular: Calcifications of the carotid siphons bilaterally. No hyperdense vessels. Skull: The skull fracture nor primary osseous lesions. Sinuses/Orbits: Intact orbits and globes bilaterally. No acute sinus disease. Clear mastoids. Other: None IMPRESSION: Chronic mild-to-moderate small vessel ischemic disease of periventricular and subcortical white matter. No acute intracranial abnormality. Electronically Signed   By: Tollie Eth M.D.   On: 04/30/2017 22:39    Follow up with PCP in 1 week.  Management plans discussed with the patient, family and they are in agreement.  CODE STATUS:     Code Status Orders        Start     Ordered   04/29/17 1311  Full code  Continuous     04/29/17 1314    Code Status History    Date Active Date Inactive Code Status Order ID Comments User Context   12/23/2016  5:45 PM 12/25/2016  6:49 PM Full Code [id number]  Katharina Caper, MD ED   11/06/2016  9:00 AM 11/07/2016  6:53 PM Full Code [id number]  Delfino Lovett, MD Inpatient   11/05/2016  2:01 PM 11/06/2016  9:00 AM Partial Code [id number]  Adrian Saran, MD Inpatient   06/03/2016  2:53 AM 06/05/2016  7:45 PM Full Code [id number]  Gery Pray, MD Inpatient   11/19/2015 12:18 PM 11/24/2015  4:24 PM Full Code [id number]  Shaune Pollack, MD Inpatient   11/09/2015 11:02 PM 11/13/2015  6:51 PM Full Code [id number]  Wyatt Haste, MD ED   10/17/2015 10:26 PM  10/22/2015  1:03 PM Full Code [id number]  Enedina Finner, MD Inpatient   10/09/2015  5:38 PM 10/12/2015  7:34 PM Full Code [id number]  Shaune Pollack, MD Inpatient   08/01/2015 12:08 AM 08/03/2015  2:54 PM Full Code [id number]  Oralia Manis, MD Inpatient    Advance Directive Documentation     Most Recent Value  Type of Advance Directive  Healthcare Power of Attorney  Pre-existing out of facility DNR order (yellow form or pink MOST form)  -  "MOST" Form in Place?  -      TOTAL TIME TAKING CARE OF THIS PATIENT ON DAY OF DISCHARGE: more than 30 minutes.   Shady Bradish, Forensic scientist  R M.D on 05/02/2017 at 12:40 PM  Between 7am to 6pm - Pager - [phone]  After 6pm go to [email or web address] - password [email or web address] [facility]  [facility]  Office  [phone]  CC: Primary care physician; Malva Limes, MD  Note: This dictation was prepared with Dragon dictation along with smaller phrase technology. Any transcriptional errors that result from this process are unintentional.

## 2017-05-03 DIAGNOSIS — R262 Difficulty in walking, not elsewhere classified: Secondary | ICD-10-CM | POA: Diagnosis not present

## 2017-05-03 DIAGNOSIS — E785 Hyperlipidemia, unspecified: Secondary | ICD-10-CM | POA: Diagnosis not present

## 2017-05-03 DIAGNOSIS — M6281 Muscle weakness (generalized): Secondary | ICD-10-CM | POA: Diagnosis not present

## 2017-05-03 DIAGNOSIS — R2681 Unsteadiness on feet: Secondary | ICD-10-CM | POA: Diagnosis not present

## 2017-05-03 DIAGNOSIS — A419 Sepsis, unspecified organism: Secondary | ICD-10-CM | POA: Diagnosis not present

## 2017-05-03 DIAGNOSIS — G894 Chronic pain syndrome: Secondary | ICD-10-CM | POA: Diagnosis not present

## 2017-05-03 DIAGNOSIS — J189 Pneumonia, unspecified organism: Secondary | ICD-10-CM | POA: Diagnosis not present

## 2017-05-03 DIAGNOSIS — K219 Gastro-esophageal reflux disease without esophagitis: Secondary | ICD-10-CM | POA: Diagnosis not present

## 2017-05-03 DIAGNOSIS — J449 Chronic obstructive pulmonary disease, unspecified: Secondary | ICD-10-CM | POA: Diagnosis not present

## 2017-05-03 DIAGNOSIS — I2510 Atherosclerotic heart disease of native coronary artery without angina pectoris: Secondary | ICD-10-CM | POA: Diagnosis not present

## 2017-05-03 DIAGNOSIS — R419 Unspecified symptoms and signs involving cognitive functions and awareness: Secondary | ICD-10-CM | POA: Diagnosis not present

## 2017-05-03 DIAGNOSIS — I10 Essential (primary) hypertension: Secondary | ICD-10-CM | POA: Diagnosis not present

## 2017-05-04 LAB — CULTURE, BLOOD (ROUTINE X 2)
CULTURE: NO GROWTH
Culture: NO GROWTH
Special Requests: ADEQUATE

## 2017-05-05 ENCOUNTER — Encounter: Payer: Self-pay | Admitting: *Deleted

## 2017-05-05 ENCOUNTER — Telehealth: Payer: Self-pay | Admitting: Family Medicine

## 2017-05-05 NOTE — Telephone Encounter (Signed)
Verbal okay given to Hospice for palliative care referral.

## 2017-05-05 NOTE — Telephone Encounter (Signed)
Please advise 

## 2017-05-05 NOTE — Telephone Encounter (Signed)
Cheryl [name] form [facility] called requesting approval for pallitive care for  goals of care and management of symptoms.[facility]neumonia.Call back [phone]

## 2017-05-05 NOTE — Telephone Encounter (Signed)
OK for palliative care referral.

## 2017-05-06 DIAGNOSIS — I10 Essential (primary) hypertension: Secondary | ICD-10-CM | POA: Diagnosis not present

## 2017-05-06 DIAGNOSIS — J189 Pneumonia, unspecified organism: Secondary | ICD-10-CM | POA: Diagnosis not present

## 2017-05-06 DIAGNOSIS — R419 Unspecified symptoms and signs involving cognitive functions and awareness: Secondary | ICD-10-CM | POA: Diagnosis not present

## 2017-05-06 DIAGNOSIS — I2510 Atherosclerotic heart disease of native coronary artery without angina pectoris: Secondary | ICD-10-CM | POA: Diagnosis not present

## 2017-05-06 DIAGNOSIS — G894 Chronic pain syndrome: Secondary | ICD-10-CM | POA: Diagnosis not present

## 2017-05-06 DIAGNOSIS — E785 Hyperlipidemia, unspecified: Secondary | ICD-10-CM | POA: Diagnosis not present

## 2017-05-06 DIAGNOSIS — K219 Gastro-esophageal reflux disease without esophagitis: Secondary | ICD-10-CM | POA: Diagnosis not present

## 2017-05-06 DIAGNOSIS — J449 Chronic obstructive pulmonary disease, unspecified: Secondary | ICD-10-CM | POA: Diagnosis not present

## 2017-05-06 DIAGNOSIS — A419 Sepsis, unspecified organism: Secondary | ICD-10-CM | POA: Diagnosis not present

## 2017-05-07 ENCOUNTER — Ambulatory Visit: Payer: Medicare HMO | Admitting: Pain Medicine

## 2017-05-13 ENCOUNTER — Encounter: Payer: Self-pay | Admitting: *Deleted

## 2017-05-13 NOTE — Telephone Encounter (Signed)
This encounter was created in error - please disregard.

## 2017-05-14 ENCOUNTER — Emergency Department
Admission: EM | Admit: 2017-05-14 | Discharge: 2017-05-14 | Disposition: A | Discharge status: Home / Self Care | Payer: Medicare HMO | Attending: Emergency Medicine | Admitting: Emergency Medicine

## 2017-05-14 ENCOUNTER — Emergency Department: Payer: Medicare HMO

## 2017-05-14 ENCOUNTER — Encounter: Payer: Self-pay | Admitting: Emergency Medicine

## 2017-05-14 ENCOUNTER — Ambulatory Visit: Payer: Medicare HMO | Admitting: Family Medicine

## 2017-05-14 DIAGNOSIS — F17210 Nicotine dependence, cigarettes, uncomplicated: Secondary | ICD-10-CM | POA: Diagnosis not present

## 2017-05-14 DIAGNOSIS — Z7902 Long term (current) use of antithrombotics/antiplatelets: Secondary | ICD-10-CM | POA: Insufficient documentation

## 2017-05-14 DIAGNOSIS — I5033 Acute on chronic diastolic (congestive) heart failure: Secondary | ICD-10-CM | POA: Insufficient documentation

## 2017-05-14 DIAGNOSIS — R2243 Localized swelling, mass and lump, lower limb, bilateral: Secondary | ICD-10-CM | POA: Insufficient documentation

## 2017-05-14 DIAGNOSIS — I2510 Atherosclerotic heart disease of native coronary artery without angina pectoris: Secondary | ICD-10-CM | POA: Diagnosis not present

## 2017-05-14 DIAGNOSIS — I509 Heart failure, unspecified: Secondary | ICD-10-CM | POA: Diagnosis not present

## 2017-05-14 DIAGNOSIS — J45909 Unspecified asthma, uncomplicated: Secondary | ICD-10-CM | POA: Insufficient documentation

## 2017-05-14 DIAGNOSIS — J449 Chronic obstructive pulmonary disease, unspecified: Secondary | ICD-10-CM | POA: Diagnosis not present

## 2017-05-14 DIAGNOSIS — M7989 Other specified soft tissue disorders: Secondary | ICD-10-CM | POA: Diagnosis not present

## 2017-05-14 DIAGNOSIS — I5031 Acute diastolic (congestive) heart failure: Secondary | ICD-10-CM | POA: Insufficient documentation

## 2017-05-14 DIAGNOSIS — I110 Hypertensive heart disease with heart failure: Secondary | ICD-10-CM | POA: Insufficient documentation

## 2017-05-14 DIAGNOSIS — M79605 Pain in left leg: Secondary | ICD-10-CM | POA: Diagnosis not present

## 2017-05-14 DIAGNOSIS — Z7951 Long term (current) use of inhaled steroids: Secondary | ICD-10-CM | POA: Diagnosis not present

## 2017-05-14 DIAGNOSIS — Z79899 Other long term (current) drug therapy: Secondary | ICD-10-CM | POA: Insufficient documentation

## 2017-05-14 DIAGNOSIS — M79604 Pain in right leg: Secondary | ICD-10-CM | POA: Diagnosis not present

## 2017-05-14 DIAGNOSIS — I517 Cardiomegaly: Secondary | ICD-10-CM | POA: Diagnosis not present

## 2017-05-14 LAB — URINALYSIS, COMPLETE (UACMP) WITH MICROSCOPIC
BILIRUBIN URINE: NEGATIVE
Bacteria, UA: NONE SEEN
GLUCOSE, UA: NEGATIVE mg/dL
HGB URINE DIPSTICK: NEGATIVE
Ketones, ur: NEGATIVE mg/dL
Leukocytes, UA: NEGATIVE
NITRITE: NEGATIVE
Specific Gravity, Urine: 1.015 (ref 1.005–1.030)
pH: 7.5 (ref 5.0–8.0)

## 2017-05-14 LAB — TSH: TSH: 3.449 u[IU]/mL (ref 0.350–4.500)

## 2017-05-14 LAB — CBC WITH DIFFERENTIAL/PLATELET
BASOS ABS: 0.2 10*3/uL — AB (ref 0–0.1)
BASOS PCT: 1 %
EOS ABS: 0.1 10*3/uL (ref 0–0.7)
EOS PCT: 1 %
HCT: 27.9 % — ABNORMAL LOW (ref 35.0–47.0)
HEMOGLOBIN: 9.1 g/dL — AB (ref 12.0–16.0)
Lymphocytes Relative: 8 %
Lymphs Abs: 1.2 10*3/uL (ref 1.0–3.6)
MCH: 27.1 pg (ref 26.0–34.0)
MCHC: 32.5 g/dL (ref 32.0–36.0)
MCV: 83.4 fL (ref 80.0–100.0)
Monocytes Absolute: 1.2 10*3/uL — ABNORMAL HIGH (ref 0.2–0.9)
Monocytes Relative: 8 %
NEUTROS PCT: 82 %
Neutro Abs: 12.1 10*3/uL — ABNORMAL HIGH (ref 1.4–6.5)
PLATELETS: 329 10*3/uL (ref 150–440)
RBC: 3.35 MIL/uL — AB (ref 3.80–5.20)
RDW: 18.5 % — ABNORMAL HIGH (ref 11.5–14.5)
WBC: 14.8 10*3/uL — AB (ref 3.6–11.0)

## 2017-05-14 LAB — BASIC METABOLIC PANEL
Anion gap: 9 (ref 5–15)
BUN: 18 mg/dL (ref 6–20)
CALCIUM: 8.4 mg/dL — AB (ref 8.9–10.3)
CO2: 31 mmol/L (ref 22–32)
Chloride: 92 mmol/L — ABNORMAL LOW (ref 101–111)
Creatinine, Ser: 0.96 mg/dL (ref 0.44–1.00)
GFR calc Af Amer: >60 mL/min (ref >60)
GFR, EST NON AFRICAN AMERICAN: 59 mL/min — AB (ref >60)
GLUCOSE: 88 mg/dL (ref 65–99)
POTASSIUM: 3.4 mmol/L — AB (ref 3.5–5.1)
SODIUM: 132 mmol/L — AB (ref 135–145)

## 2017-05-14 LAB — HEPATIC FUNCTION PANEL
ALT: 30 U/L (ref 14–54)
AST: 49 U/L — ABNORMAL HIGH (ref 15–41)
Albumin: 4.1 g/dL (ref 3.5–5.0)
Alkaline Phosphatase: 146 U/L — ABNORMAL HIGH (ref 38–126)
TOTAL PROTEIN: 7.3 g/dL (ref 6.5–8.1)
Total Bilirubin: 0.7 mg/dL (ref 0.3–1.2)

## 2017-05-14 LAB — BRAIN NATRIURETIC PEPTIDE: B NATRIURETIC PEPTIDE 5: 137 pg/mL — AB (ref 0.0–100.0)

## 2017-05-14 LAB — TROPONIN I

## 2017-05-14 MED ORDER — FUROSEMIDE 10 MG/ML IJ SOLN
40.0000 mg | Freq: Once | INTRAMUSCULAR | Status: AC
  Administered 2017-05-14: 40 mg via INTRAVENOUS
  Filled 2017-05-14: qty 4

## 2017-05-14 NOTE — Discharge Instructions (Signed)
Please follow-up in the heart failure clinic tomorrow for reevaluation. Continue taking all of your medications as prescribed and return to the emergency department sooner for any new or worsening symptoms such as chest pain, shortness of breath, or for any other concerns.  It was a pleasure to take care of you today, and thank you for coming to our emergency department.  If you have any questions or concerns before leaving please ask the nurse to grab me and I'm more than happy to go through your aftercare instructions again.  If you were prescribed any opioid pain medication today such as Norco, Vicodin, Percocet, morphine, hydrocodone, or oxycodone please make sure you do not drive when you are taking this medication as it can alter your ability to drive safely.  If you have any concerns once you are home that you are not improving or are in fact getting worse before you can make it to your follow-up appointment, please do not hesitate to call [phone] and come back for further evaluation.  Merrily BrittleNeil Karrigan Messamore MD  Results for orders placed or performed during the hospital encounter of 05/14/17  Basic metabolic panel  Result Value Ref Range   Sodium 132 (L) 135 - 145 mmol/L   Potassium 3.4 (L) 3.5 - 5.1 mmol/L   Chloride 92 (L) 101 - 111 mmol/L   CO2 31 22 - 32 mmol/L   Glucose, Bld 88 65 - 99 mg/dL   BUN 18 6 - 20 mg/dL   Creatinine, Ser 0.980.96 0.44 - 1.00 mg/dL   Calcium 8.4 (L) 8.9 - 10.3 mg/dL   GFR calc non Af Amer 59 (L) >60 mL/min   GFR calc Af Amer >60 >60 mL/min   Anion gap 9 5 - 15  Hepatic function panel  Result Value Ref Range   Total Protein 7.3 6.5 - 8.1 g/dL   Albumin 4.1 3.5 - 5.0 g/dL   AST 49 (H) 15 - 41 U/L   ALT 30 14 - 54 U/L   Alkaline Phosphatase 146 (H) 38 - 126 U/L   Total Bilirubin 0.7 0.3 - 1.2 mg/dL   Bilirubin, Direct <1.1<0.1 (L) 0.1 - 0.5 mg/dL   Indirect Bilirubin NOT CALCULATED 0.3 - 0.9 mg/dL  Troponin I  Result Value Ref Range   Troponin I <0.03 <0.03 ng/mL    Brain natriuretic peptide  Result Value Ref Range   B Natriuretic Peptide 137.0 (H) 0.0 - 100.0 pg/mL  CBC with Differential  Result Value Ref Range   WBC 14.8 (H) 3.6 - 11.0 K/uL   RBC 3.35 (L) 3.80 - 5.20 MIL/uL   Hemoglobin 9.1 (L) 12.0 - 16.0 g/dL   HCT 91.427.9 (L) 78.235.0 - 95.647.0 %   MCV 83.4 80.0 - 100.0 fL   MCH 27.1 26.0 - 34.0 pg   MCHC 32.5 32.0 - 36.0 g/dL   RDW 21.318.5 (H) 08.611.5 - 57.814.5 %   Platelets 329 150 - 440 K/uL   Neutrophils Relative % 82 %   Neutro Abs 12.1 (H) 1.4 - 6.5 K/uL   Lymphocytes Relative 8 %   Lymphs Abs 1.2 1.0 - 3.6 K/uL   Monocytes Relative 8 %   Monocytes Absolute 1.2 (H) 0.2 - 0.9 K/uL   Eosinophils Relative 1 %   Eosinophils Absolute 0.1 0 - 0.7 K/uL   Basophils Relative 1 %   Basophils Absolute 0.2 (H) 0 - 0.1 K/uL  Urinalysis, Complete w Microscopic  Result Value Ref Range   Color, Urine YELLOW YELLOW  APPearance CLEAR CLEAR   Specific Gravity, Urine 1.015 1.005 - 1.030   pH 7.5 5.0 - 8.0   Glucose, UA NEGATIVE NEGATIVE mg/dL   Hgb urine dipstick NEGATIVE NEGATIVE   Bilirubin Urine NEGATIVE NEGATIVE   Ketones, ur NEGATIVE NEGATIVE mg/dL   Protein, ur TRACE (A) NEGATIVE mg/dL   Nitrite NEGATIVE NEGATIVE   Leukocytes, UA NEGATIVE NEGATIVE   Squamous Epithelial / LPF 0-5 (A) NONE SEEN   WBC, UA 0-5 0 - 5 WBC/hpf   RBC / HPF 0-5 0 - 5 RBC/hpf   Bacteria, UA NONE SEEN NONE SEEN  TSH  Result Value Ref Range   TSH 3.449 0.350 - 4.500 uIU/mL   Dg Chest 2 View  Result Date: 05/14/2017 CLINICAL DATA:  Bilateral lower extremity swelling and pain which began yesterday. History of asthma -COPD, current smoker, coronary artery disease. EXAM: CHEST  2 VIEW COMPARISON:  Portable chest x-ray of April 29, 2017 and PA and lateral chest x-ray of January 28, 2017. FINDINGS: The lungs are well-expanded. The interstitial markings are increased bilaterally. There is lingular atelectasis or pneumonia. The cardiac silhouette is enlarged. The pulmonary vascularity is  mildly prominent. There is calcification in the wall of the aortic arch. There is a moderate-sized hiatal hernia. There is no pleural effusion. There is prominent thoracic kyphosis. IMPRESSION: COPD. Lingular consolidation compatible with pneumonia. Cardiomegaly with interstitial edema bilaterally consistent with mild cardiac decompensation. Thoracic aortic atherosclerosis. Electronically Signed   By: David  [place] M.D.   On: 05/14/2017 14:13   Ct Head Wo Contrast  Result Date: 04/30/2017 CLINICAL DATA:  Unresponsive and not responding to verbal components. Altered mental status. EXAM: CT HEAD WITHOUT CONTRAST TECHNIQUE: Contiguous axial images were obtained from the base of the skull through the vertex without intravenous contrast. COMPARISON:  None. FINDINGS: Brain: Normal size ventricles. No intra-axial mass, hemorrhage or midline shift. No extra-axial fluid collections. Chronic stable mild-to-moderate small vessel ischemic disease of periventricular and subcortical white matter. No large vascular territory infarct. Vascular: Calcifications of the carotid siphons bilaterally. No hyperdense vessels. Skull: The skull fracture nor primary osseous lesions. Sinuses/Orbits: Intact orbits and globes bilaterally. No acute sinus disease. Clear mastoids. Other: None IMPRESSION: Chronic mild-to-moderate small vessel ischemic disease of periventricular and subcortical white matter. No acute intracranial abnormality. Electronically Signed   By: Tollie Eth M.D.   On: 04/30/2017 22:39   US Venous Img Lower Bilateral  Result Date: 05/14/2017 CLINICAL DATA:  Bilateral leg swelling, pain EXAM: BILATERAL LOWER EXTREMITY VENOUS DOPPLER ULTRASOUND TECHNIQUE: Gray-scale sonography with graded compression, as well as color Doppler and duplex ultrasound were performed to evaluate the lower extremity deep venous systems from the level of the common femoral vein and including the common femoral, femoral, profunda femoral,  popliteal and calf veins including the posterior tibial, peroneal and gastrocnemius veins when visible. The superficial great saphenous vein was also interrogated. Spectral Doppler was utilized to evaluate flow at rest and with distal augmentation maneuvers in the common femoral, femoral and popliteal veins. COMPARISON:  08/20/2014 FINDINGS: RIGHT LOWER EXTREMITY Common Femoral Vein: No evidence of thrombus. Normal compressibility, respiratory phasicity and response to augmentation. Saphenofemoral Junction: No evidence of thrombus. Normal compressibility and flow on color Doppler imaging. Profunda Femoral Vein: No evidence of thrombus. Normal compressibility and flow on color Doppler imaging. Femoral Vein: No evidence of thrombus. Normal compressibility, respiratory phasicity and response to augmentation. Popliteal Vein: No evidence of thrombus. Normal compressibility, respiratory phasicity and response to augmentation. Calf Veins: No evidence  of thrombus. Normal compressibility and flow on color Doppler imaging. Superficial Great Saphenous Vein: No evidence of thrombus. Normal compressibility and flow on color Doppler imaging. Venous Reflux:  None. Other Findings:  None. LEFT LOWER EXTREMITY Common Femoral Vein: No evidence of thrombus. Normal compressibility, respiratory phasicity and response to augmentation. Saphenofemoral Junction: No evidence of thrombus. Normal compressibility and flow on color Doppler imaging. Profunda Femoral Vein: No evidence of thrombus. Normal compressibility and flow on color Doppler imaging. Femoral Vein: No evidence of thrombus. Normal compressibility, respiratory phasicity and response to augmentation. Popliteal Vein: No evidence of thrombus. Normal compressibility, respiratory phasicity and response to augmentation. Calf Veins: Limited visualization.  No definite thrombosis. Superficial Great Saphenous Vein: No evidence of thrombus. Normal compressibility and flow on color Doppler  imaging. Venous Reflux:  None. Other Findings:  None. IMPRESSION: No evidence of DVT within either lower extremity. Electronically Signed   By: Charlett Nose M.D.   On: 05/14/2017 13:29   Dg Chest Port 1 View  Result Date: 04/29/2017 CLINICAL DATA:  COPD. EXAM: PORTABLE CHEST 1 VIEW COMPARISON:  01/28/2017 . 01/05/2017. 11/05/2016. 01/22/2015 CT 06/03/2016. FINDINGS: Mediastinum and hilar structures normal. Persistent left base pleuroparenchymal thickening noted consistent with scarring. Possible new infiltrate left perihilar region. Overlying EKG leads make evaluation difficult. No pleural effusion or pneumothorax . Heart size stable. IMPRESSION: 1. Questionable new mild left perihilar infiltrate. Overlying EKG leads make evaluation difficult. 2. Persistent left base pleural-parenchymal thickening consistent with scarring. Similar findings noted on multiple prior exams. 3. Stable cardiomegaly . Electronically Signed   By: Maisie Fus  Register   On: 04/29/2017 10:59

## 2017-05-14 NOTE — ED Provider Notes (Signed)
[facility] Emergency Department Provider Note  ____________________________________________   First MD Initiated Contact with Patient 05/14/17 1310     (approximate)  I have reviewed the triage vital signs and the nursing notes.   HISTORY  Chief Complaint Leg Swelling   HPI Cheryl [name] Genna is a 66 y.o. female who self presents to the emergency Department with roughly 2 weeks of progressive bilateral lower extremity swelling but acutely worse over the past 24 hours or so. She says she has a history of "fluid retention" for which she takes a "water pill" her legs have never been this swollen before. She sleeps only on one pillow and does not wake at night short of breath. She has no history of congestive heart failure or coronary artery disease. She is not short of breath and has not been coughing. Her legs are not painful.   Past Medical History:  Diagnosis Date  . Allergy   . Anxiety   . Arthritis   . Aspiration pneumonitis (HCC) 11/24/2015  . Asthma   . Chronic pain   . COPD (chronic obstructive pulmonary disease) (HCC)   . Coronary artery disease   . DVT (deep venous thrombosis) (HCC)   . GERD (gastroesophageal reflux disease)   . Headache   . Hyperlipidemia   . Hypertension   . Migraines   . Neuropathy 2010  . Osteoporosis   . Oxygen deficiency   . Peripheral vascular disease (HCC)   . Pneumonia   . Pneumonia 11/19/2015  . Pneumonia 10/2016  . Vitamin D deficiency     Patient Active Problem List   Diagnosis Date Noted  . Pneumonia 04/29/2017  . Acute postoperative pain 04/14/2017  . Metabolic encephalopathy 12/23/2016  . Hypokalemia 12/23/2016  . Protein-calorie malnutrition, severe 11/06/2016  . Nausea with vomiting   . Lumbar facet syndrome (Location of Primary Source of Pain) (Bilateral) (L>R) 03/20/2016  . Lumbar spondylosis 03/20/2016  . Opiate use (160 MME/Day) 02/28/2016  . Anemia 02/28/2016  . Osteoarthrosis 12/18/2015   . Long term current use of anticoagulant therapy (Plavix) 12/18/2015  . Long term current use of opiate analgesic 12/04/2015  . Fibromyalgia 12/04/2015  . Dysphagia 11/24/2015  . Acute diastolic CHF (congestive heart failure) (HCC) 11/24/2015  . Leukocytosis 11/24/2015  . Sepsis (HCC) 10/17/2015  . Abnormal mammogram of right breast 10/11/2015  . Dilated intrahepatic bile duct 10/11/2015  . Lumbar radicular pain (Bilateral) (L>R) (L4) 09/04/2015  . Chronic low back pain (Location of Primary Source of Pain) (Bilateral) (L>R) 09/04/2015  . Encounter for therapeutic drug level monitoring 09/04/2015  . Uncomplicated opioid dependence (HCC) 09/04/2015  . Chronic pain syndrome 09/04/2015  . Platelet inhibition due to Plavix 09/04/2015  . COPD (chronic obstructive pulmonary disease) (HCC) 07/31/2015  . Dementia 07/31/2015  . Airway hyperreactivity 07/03/2015  . Back pain, thoracic 07/03/2015  . Excessive falling 07/03/2015  . Alteration in bowel elimination: incontinence 07/03/2015  . Insomnia 07/03/2015  . Decreased testosterone level 07/03/2015  . Leg weakness 07/03/2015  . Menopausal symptom 07/03/2015  . Migraine 07/03/2015  . Neuropathy (HCC) 07/03/2015  . Fecal occult blood test positive 07/03/2015  . OP (osteoporosis) 07/03/2015  . Panic disorder 07/03/2015  . Compulsive tobacco user syndrome 07/03/2015  . Urinary incontinence 07/03/2015  . Weight loss 07/03/2015  . Essential hypertension 06/06/2015  . GERD (gastroesophageal reflux disease) 06/06/2015  . Hyperlipemia 06/06/2015  . Peripheral nerve disease 04/13/2014  . Anxiety 02/10/2014  . Coronary artery disease 02/10/2014  .  Hypercholesteremia 02/10/2014  . Peripheral vascular disease (HCC) 02/10/2014  . Vitamin D deficiency 10/16/2009    Past Surgical History:  Procedure Laterality Date  . ABDOMINAL HYSTERECTOMY  1975   Bilaterl Oophorectomy; Dur to IUD infection  . abdomnal aortic stent  05/30/2008   Dr.  Nanetta Batty  . APPENDECTOMY    . APPENDECTOMY    . cardiac catherization  10/31/2009  . CERVICAL FUSION  C5 - 6/C6-7  . CHOLECYSTECTOMY  1972  . COLONOSCOPY WITH PROPOFOL N/A 07/27/2015   Procedure: COLONOSCOPY WITH PROPOFOL;  Surgeon: Wallace Cullens, MD;  Location: [facility] ENDOSCOPY;  Service: Gastroenterology;  Laterality: N/A;  . ESOPHAGOGASTRODUODENOSCOPY (EGD) WITH PROPOFOL N/A 07/27/2015   Procedure: ESOPHAGOGASTRODUODENOSCOPY (EGD) WITH PROPOFOL;  Surgeon: Wallace Cullens, MD;  [facility] ENDOSCOPY;  Service: Gastroenterology;  Laterality: N/A;  . FOOT SURGERY Bilateral    5-6 years per patient  . SPINE SURGERY      Prior to Admission medications   Medication Sig Start Date End Date Taking? Authorizing Provider  acetaminophen (TYLENOL) 325 MG tablet Take 2 tablets (650 mg total) by mouth every 6 (six) hours as needed for mild pain (or Fever >/= 101). 12/25/16   Enid Baas, MD  ADVAIR DISKUS 250-50 MCG/DOSE AEPB inhale 1 dose by mouth twice a day 08/15/16   Malva Limes, MD  albuterol (PROVENTIL HFA;VENTOLIN HFA) 108 (90 BASE) MCG/ACT inhaler Inhale 2 puffs into the lungs every 4 (four) hours as needed for wheezing or shortness of breath. 08/11/15   Malva Limes, MD  albuterol (PROVENTIL) (2.5 MG/3ML) 0.083% nebulizer solution Take 3 mLs (2.5 mg total) by nebulization every 4 (four) hours as needed for wheezing. 10/12/15   Katharina Caper, MD  ALPRAZolam Prudy Feeler) 0.5 MG tablet Take 1 tablet (0.5 mg total) by mouth 3 (three) times daily as needed for anxiety. 05/02/17   Milagros Loll, MD  benzonatate (TESSALON PERLES) 100 MG capsule Take 1 capsule (100 mg total) by mouth 3 (three) times daily as needed for cough. Patient not taking: Reported on 04/29/2017 01/28/17   Malva Limes, MD  celecoxib (CELEBREX) 100 MG capsule Take 1 capsule (100 mg total) by mouth 2 (two) times daily as needed. Patient taking differently: Take 200 mg by mouth daily as needed.  10/24/16   Malva Limes, MD    clopidogrel (PLAVIX) 75 MG tablet Take 75 mg by mouth daily.    [provider]  diphenoxylate-atropine (LOMOTIL) 2.5-0.025 MG per tablet Take 2 tablets by mouth 4 (four) times daily as needed for diarrhea or loose stools. Reported on 03/11/2016    [provider]  donepezil (ARICEPT) 10 MG tablet Take 10 mg by mouth at bedtime.    [provider]  estropipate (OGEN) 0.75 MG tablet Take 1 tablet (0.75 mg total) by mouth every other day. Patient taking differently: Take 0.75 mg by mouth daily.  12/17/16   Malva Limes, MD  fentaNYL (DURAGESIC - DOSED MCG/HR) 50 MCG/HR Place 1 patch (50 mcg total) onto the skin every 3 (three) days. 05/18/17 06/17/17  Milagros Loll, MD  furosemide (LASIX) 40 MG tablet Take 40 mg by mouth daily.  02/16/17   [provider]  levofloxacin (LEVAQUIN) 500 MG tablet Take 1 tablet (500 mg total) by mouth daily. 05/02/17   Sudini, Srikar, MD  LYRICA 300 MG capsule Take 300 mg by mouth 2 (two) times daily. 04/12/17   [provider]  pantoprazole (PROTONIX) 40 MG tablet take 1 tablet  by mouth twice a day Patient taking differently: Take 40 mg by mouth once daily 04/17/17   Malva Limes, MD  simvastatin (ZOCOR) 10 MG tablet Take 10 mg by mouth at bedtime. Reported on 12/04/2015    [provider]  SPIRIVA HANDIHALER 18 MCG inhalation capsule inhale the contents of one capsule in the handihaler once daily 11/16/16   Malva Limes, MD  SUMAtriptan (IMITREX) 25 MG tablet Take 25 mg by mouth as needed for migraine. May repeat in 2 hours if headache persists or recurs.    [provider]  traMADol (ULTRAM) 50 MG tablet Take 2 tablets (100 mg total) by mouth every 6 (six) hours as needed for moderate pain or severe pain. 05/02/17 07/31/17  Milagros Loll, MD  triamcinolone ointment (KENALOG) 0.5 % Apply to lesions on hands twice a day as needed Patient not taking: Reported on 04/29/2017 10/24/16   Malva Limes, MD     Allergies Percocet [oxycodone-acetaminophen]; Aspirin; Codeine; Propoxyphene; and Sulfa antibiotics  Family History  Problem Relation Age of Onset  . Cancer Mother   . Arthritis Mother   . Heart disease Mother   . Diabetes Mother        mellitus, type 2  . Heart disease Father   . Diabetes Sister   . Cancer Brother   . Cancer Brother        lung  . Diabetes Brother     Social History Social History  Substance Use Topics  . Smoking status: Current Every Day Smoker    Packs/day: 1.00    Years: 50.00    Types: Cigarettes  . Smokeless tobacco: Never Used     Comment: Previously smoked 2 ppd  . Alcohol use No    Review of Systems Constitutional: No fever/chills Eyes: No visual changes. ENT: No sore throat. Cardiovascular: Denies chest pain. Respiratory: Denies shortness of breath. Gastrointestinal: No abdominal pain.  No nausea, no vomiting.  No diarrhea.  No constipation. Genitourinary: Negative for dysuria. Musculoskeletal: Positive for leg swelling Skin: Negative for rash. Neurological: Negative for headaches, focal weakness or numbness.   ____________________________________________   PHYSICAL EXAM:  VITAL SIGNS: ED Triage Vitals  Enc Vitals Group     BP 05/14/17 1148 (!) 127/105     Pulse Rate 05/14/17 1148 87     Resp 05/14/17 1148 18     Temp 05/14/17 1148 99.1 F (37.3 C)     Temp Source 05/14/17 1148 Oral     SpO2 05/14/17 1148 90 %     Weight 05/14/17 1148 91 lb (41.3 kg)     Height 05/14/17 1148 4\' 10"  (1.473 m)     Head Circumference --      Peak Flow --      Pain Score 05/14/17 1147 4     Pain Loc --      Pain Edu? --      Excl. in GC? --     Constitutional: Alert and oriented 4 chronically ill-appearing but in no acute distress smiling laughing and joking Eyes: PERRL EOMI. Head: Atraumatic. Nose: No congestion/rhinnorhea. Mouth/Throat: No trismus Neck: No stridor.   Cardiovascular: Normal rate, regular rhythm. Grossly normal  heart sounds.  Good peripheral circulation. Able to lie completely flat but does have moderate jugular venous distention Respiratory: Normal respiratory effort.  No retractions. Lungs CTAB and moving good air Gastrointestinal: Soft nontender Musculoskeletal: 2+ pitting edema to bilateral knees bilaterally legs are not red warm or tender no cords.  Legs are equal in size Neurologic:  Normal speech and language. No gross focal neurologic deficits are appreciated. Skin:  Skin is warm, dry and intact. No rash noted. Psychiatric: Mood and affect are normal. Speech and behavior are normal.    ____________________________________________   DIFFERENTIAL  Deep vein thrombosis, congestive heart failure, hypothyroidism, nephrotic syndrome, cirrhosis ____________________________________________   LABS (all labs ordered are listed, but only abnormal results are displayed)  Labs Reviewed  BASIC METABOLIC PANEL - Abnormal; Notable for the following:       Result Value   Sodium 132 (*)    Potassium 3.4 (*)    Chloride 92 (*)    Calcium 8.4 (*)    GFR calc non Af Amer 59 (*)    All other components within normal limits  HEPATIC FUNCTION PANEL - Abnormal; Notable for the following:    AST 49 (*)    Alkaline Phosphatase 146 (*)    Bilirubin, Direct <0.1 (*)    All other components within normal limits  BRAIN NATRIURETIC PEPTIDE - Abnormal; Notable for the following:    B Natriuretic Peptide 137.0 (*)    All other components within normal limits  CBC WITH DIFFERENTIAL/PLATELET - Abnormal; Notable for the following:    WBC 14.8 (*)    RBC 3.35 (*)    Hemoglobin 9.1 (*)    HCT 27.9 (*)    RDW 18.5 (*)    Neutro Abs 12.1 (*)    Monocytes Absolute 1.2 (*)    Basophils Absolute 0.2 (*)    All other components within normal limits  URINALYSIS, COMPLETE (UACMP) WITH MICROSCOPIC - Abnormal; Notable for the following:    Protein, ur TRACE (*)    Squamous Epithelial / LPF 0-5 (*)    All other  components within normal limits  TROPONIN I  TSH    Slightly elevated BNP possibly consistent with fluid overload __________________________________________  EKG   ____________________________________________  RADIOLOGY  Ultrasound shows no evidence of deep vein thrombus ____________________________________________   PROCEDURES  Procedure(s) performed: no  Procedures  Critical Care performed: no  Observation: no ____________________________________________   INITIAL IMPRESSION / ASSESSMENT AND PLAN / ED COURSE  Pertinent labs & imaging results that were available during my care of the patient were reviewed by me and considered in my medical decision making (see chart for details).  The patient arrives well-appearing although with new bilateral lower extremity edema. Differential is broad and includes congestive heart failure, DVT, thyroid, renal, and liver. Labs are pending.     ----------------------------------------- 3:49 PM on 05/14/2017 -----------------------------------------  The patient's BNP came back slightly elevated and on chart review she did have an abnormal echocardiogram in 2016 showing an EF of 50%. At this point she reports dietary noncompliance with a significant amount of salt. I will give her a single dose of IV Lasix now as well as heart failure appointment follow-up tomorrow. ____________________________________________   FINAL CLINICAL IMPRESSION(S) / ED DIAGNOSES  Final diagnoses:  Leg swelling  Acute on chronic congestive heart failure, unspecified heart failure type (HCC)      NEW MEDICATIONS STARTED DURING THIS VISIT:  Discharge Medication List as of 05/14/2017  3:49 PM       Note:  This document was prepared using Dragon voice recognition software and may include unintentional dictation errors.     Merrily Brittle, MD 05/15/17 [phone]

## 2017-05-14 NOTE — ED Triage Notes (Signed)
Pt reports bilateral leg swelling and pain that began yesterday. Pt with mild pitting edema noted to lower legs bilaterally. Lower extremities warm to touch, pedal pulses palpable bilaterally. Pt states has history of COPD, denies CHF.

## 2017-05-14 NOTE — ED Notes (Signed)
ED Provider at bedside. 

## 2017-05-15 DIAGNOSIS — I340 Nonrheumatic mitral (valve) insufficiency: Secondary | ICD-10-CM | POA: Diagnosis not present

## 2017-05-15 DIAGNOSIS — I2510 Atherosclerotic heart disease of native coronary artery without angina pectoris: Secondary | ICD-10-CM | POA: Diagnosis not present

## 2017-05-15 DIAGNOSIS — I10 Essential (primary) hypertension: Secondary | ICD-10-CM | POA: Diagnosis not present

## 2017-05-15 DIAGNOSIS — R600 Localized edema: Secondary | ICD-10-CM | POA: Diagnosis not present

## 2017-05-16 DIAGNOSIS — G8929 Other chronic pain: Secondary | ICD-10-CM | POA: Diagnosis not present

## 2017-05-16 DIAGNOSIS — J440 Chronic obstructive pulmonary disease with acute lower respiratory infection: Secondary | ICD-10-CM | POA: Diagnosis not present

## 2017-05-16 DIAGNOSIS — J189 Pneumonia, unspecified organism: Secondary | ICD-10-CM | POA: Diagnosis not present

## 2017-05-16 DIAGNOSIS — I10 Essential (primary) hypertension: Secondary | ICD-10-CM | POA: Diagnosis not present

## 2017-05-16 DIAGNOSIS — J9611 Chronic respiratory failure with hypoxia: Secondary | ICD-10-CM | POA: Diagnosis not present

## 2017-05-16 DIAGNOSIS — J449 Chronic obstructive pulmonary disease, unspecified: Secondary | ICD-10-CM | POA: Diagnosis not present

## 2017-05-16 DIAGNOSIS — I739 Peripheral vascular disease, unspecified: Secondary | ICD-10-CM | POA: Diagnosis not present

## 2017-05-16 DIAGNOSIS — J441 Chronic obstructive pulmonary disease with (acute) exacerbation: Secondary | ICD-10-CM | POA: Diagnosis not present

## 2017-05-16 DIAGNOSIS — I2510 Atherosclerotic heart disease of native coronary artery without angina pectoris: Secondary | ICD-10-CM | POA: Diagnosis not present

## 2017-05-16 DIAGNOSIS — M810 Age-related osteoporosis without current pathological fracture: Secondary | ICD-10-CM | POA: Diagnosis not present

## 2017-05-19 ENCOUNTER — Telehealth: Payer: Self-pay | Admitting: Family Medicine

## 2017-05-19 DIAGNOSIS — J441 Chronic obstructive pulmonary disease with (acute) exacerbation: Secondary | ICD-10-CM | POA: Diagnosis not present

## 2017-05-19 DIAGNOSIS — J440 Chronic obstructive pulmonary disease with acute lower respiratory infection: Secondary | ICD-10-CM | POA: Diagnosis not present

## 2017-05-19 DIAGNOSIS — J9611 Chronic respiratory failure with hypoxia: Secondary | ICD-10-CM | POA: Diagnosis not present

## 2017-05-19 DIAGNOSIS — I10 Essential (primary) hypertension: Secondary | ICD-10-CM | POA: Diagnosis not present

## 2017-05-19 DIAGNOSIS — J189 Pneumonia, unspecified organism: Secondary | ICD-10-CM | POA: Diagnosis not present

## 2017-05-19 DIAGNOSIS — M810 Age-related osteoporosis without current pathological fracture: Secondary | ICD-10-CM | POA: Diagnosis not present

## 2017-05-19 DIAGNOSIS — I2510 Atherosclerotic heart disease of native coronary artery without angina pectoris: Secondary | ICD-10-CM | POA: Diagnosis not present

## 2017-05-19 DIAGNOSIS — I739 Peripheral vascular disease, unspecified: Secondary | ICD-10-CM | POA: Diagnosis not present

## 2017-05-19 DIAGNOSIS — G8929 Other chronic pain: Secondary | ICD-10-CM | POA: Diagnosis not present

## 2017-05-19 NOTE — Telephone Encounter (Signed)
[name] with [facility] called to say pt just got out of nursing home last week and still couhging up yellow , lungs clear, no fever.   Her legs are red and swollen.    Please advise  Thanks teri

## 2017-05-19 NOTE — Telephone Encounter (Signed)
Per Joycelyn ManJennifer Burnette, patient should be seen for ov. Patient prefers to see Dr. Sherrie MustacheFisher. Patient was notified Dr. Sherrie MustacheFisher is out of the office until next week. Patient refused ov and stated that she is unable to come to the office until after the week of July 4th. Patient scheduled appt 06/03/2017.

## 2017-05-20 ENCOUNTER — Other Ambulatory Visit: Payer: Self-pay | Admitting: Pain Medicine

## 2017-05-20 DIAGNOSIS — G894 Chronic pain syndrome: Secondary | ICD-10-CM

## 2017-05-21 DIAGNOSIS — J440 Chronic obstructive pulmonary disease with acute lower respiratory infection: Secondary | ICD-10-CM | POA: Diagnosis not present

## 2017-05-21 DIAGNOSIS — J9611 Chronic respiratory failure with hypoxia: Secondary | ICD-10-CM | POA: Diagnosis not present

## 2017-05-21 DIAGNOSIS — I739 Peripheral vascular disease, unspecified: Secondary | ICD-10-CM | POA: Diagnosis not present

## 2017-05-21 DIAGNOSIS — M810 Age-related osteoporosis without current pathological fracture: Secondary | ICD-10-CM | POA: Diagnosis not present

## 2017-05-21 DIAGNOSIS — J189 Pneumonia, unspecified organism: Secondary | ICD-10-CM | POA: Diagnosis not present

## 2017-05-21 DIAGNOSIS — G8929 Other chronic pain: Secondary | ICD-10-CM | POA: Diagnosis not present

## 2017-05-21 DIAGNOSIS — I10 Essential (primary) hypertension: Secondary | ICD-10-CM | POA: Diagnosis not present

## 2017-05-21 DIAGNOSIS — I2510 Atherosclerotic heart disease of native coronary artery without angina pectoris: Secondary | ICD-10-CM | POA: Diagnosis not present

## 2017-05-21 DIAGNOSIS — J441 Chronic obstructive pulmonary disease with (acute) exacerbation: Secondary | ICD-10-CM | POA: Diagnosis not present

## 2017-05-22 ENCOUNTER — Telehealth: Payer: Self-pay | Admitting: Family

## 2017-05-22 ENCOUNTER — Ambulatory Visit: Payer: Medicare HMO | Admitting: Family

## 2017-05-22 DIAGNOSIS — J449 Chronic obstructive pulmonary disease, unspecified: Secondary | ICD-10-CM | POA: Diagnosis not present

## 2017-05-22 DIAGNOSIS — I10 Essential (primary) hypertension: Secondary | ICD-10-CM | POA: Diagnosis not present

## 2017-05-22 DIAGNOSIS — I739 Peripheral vascular disease, unspecified: Secondary | ICD-10-CM | POA: Diagnosis not present

## 2017-05-22 DIAGNOSIS — I2510 Atherosclerotic heart disease of native coronary artery without angina pectoris: Secondary | ICD-10-CM | POA: Diagnosis not present

## 2017-05-22 DIAGNOSIS — Z801 Family history of malignant neoplasm of trachea, bronchus and lung: Secondary | ICD-10-CM | POA: Diagnosis not present

## 2017-05-22 DIAGNOSIS — K210 Gastro-esophageal reflux disease with esophagitis: Secondary | ICD-10-CM | POA: Diagnosis not present

## 2017-05-22 DIAGNOSIS — R0602 Shortness of breath: Secondary | ICD-10-CM | POA: Diagnosis not present

## 2017-05-22 NOTE — Telephone Encounter (Signed)
Patient missed her initial appointment at the [facility] on 05/22/17. Will attempt to reschedule.

## 2017-05-26 DIAGNOSIS — I2510 Atherosclerotic heart disease of native coronary artery without angina pectoris: Secondary | ICD-10-CM | POA: Diagnosis not present

## 2017-05-26 DIAGNOSIS — G8929 Other chronic pain: Secondary | ICD-10-CM | POA: Diagnosis not present

## 2017-05-26 DIAGNOSIS — J441 Chronic obstructive pulmonary disease with (acute) exacerbation: Secondary | ICD-10-CM | POA: Diagnosis not present

## 2017-05-26 DIAGNOSIS — I10 Essential (primary) hypertension: Secondary | ICD-10-CM | POA: Diagnosis not present

## 2017-05-26 DIAGNOSIS — J189 Pneumonia, unspecified organism: Secondary | ICD-10-CM | POA: Diagnosis not present

## 2017-05-26 DIAGNOSIS — J9611 Chronic respiratory failure with hypoxia: Secondary | ICD-10-CM | POA: Diagnosis not present

## 2017-05-26 DIAGNOSIS — J440 Chronic obstructive pulmonary disease with acute lower respiratory infection: Secondary | ICD-10-CM | POA: Diagnosis not present

## 2017-05-26 DIAGNOSIS — I739 Peripheral vascular disease, unspecified: Secondary | ICD-10-CM | POA: Diagnosis not present

## 2017-05-26 DIAGNOSIS — M810 Age-related osteoporosis without current pathological fracture: Secondary | ICD-10-CM | POA: Diagnosis not present

## 2017-05-27 DIAGNOSIS — J441 Chronic obstructive pulmonary disease with (acute) exacerbation: Secondary | ICD-10-CM | POA: Diagnosis not present

## 2017-05-27 DIAGNOSIS — J440 Chronic obstructive pulmonary disease with acute lower respiratory infection: Secondary | ICD-10-CM | POA: Diagnosis not present

## 2017-05-27 DIAGNOSIS — J9611 Chronic respiratory failure with hypoxia: Secondary | ICD-10-CM | POA: Diagnosis not present

## 2017-05-27 DIAGNOSIS — I2510 Atherosclerotic heart disease of native coronary artery without angina pectoris: Secondary | ICD-10-CM | POA: Diagnosis not present

## 2017-05-27 DIAGNOSIS — I739 Peripheral vascular disease, unspecified: Secondary | ICD-10-CM | POA: Diagnosis not present

## 2017-05-27 DIAGNOSIS — M810 Age-related osteoporosis without current pathological fracture: Secondary | ICD-10-CM | POA: Diagnosis not present

## 2017-05-27 DIAGNOSIS — J189 Pneumonia, unspecified organism: Secondary | ICD-10-CM | POA: Diagnosis not present

## 2017-05-27 DIAGNOSIS — G8929 Other chronic pain: Secondary | ICD-10-CM | POA: Diagnosis not present

## 2017-05-27 DIAGNOSIS — I10 Essential (primary) hypertension: Secondary | ICD-10-CM | POA: Diagnosis not present

## 2017-05-29 DIAGNOSIS — J189 Pneumonia, unspecified organism: Secondary | ICD-10-CM | POA: Diagnosis not present

## 2017-05-29 DIAGNOSIS — I2510 Atherosclerotic heart disease of native coronary artery without angina pectoris: Secondary | ICD-10-CM | POA: Diagnosis not present

## 2017-05-29 DIAGNOSIS — G8929 Other chronic pain: Secondary | ICD-10-CM | POA: Diagnosis not present

## 2017-05-29 DIAGNOSIS — J440 Chronic obstructive pulmonary disease with acute lower respiratory infection: Secondary | ICD-10-CM | POA: Diagnosis not present

## 2017-05-29 DIAGNOSIS — J9611 Chronic respiratory failure with hypoxia: Secondary | ICD-10-CM | POA: Diagnosis not present

## 2017-05-29 DIAGNOSIS — M810 Age-related osteoporosis without current pathological fracture: Secondary | ICD-10-CM | POA: Diagnosis not present

## 2017-05-29 DIAGNOSIS — I10 Essential (primary) hypertension: Secondary | ICD-10-CM | POA: Diagnosis not present

## 2017-05-29 DIAGNOSIS — I739 Peripheral vascular disease, unspecified: Secondary | ICD-10-CM | POA: Diagnosis not present

## 2017-05-29 DIAGNOSIS — J441 Chronic obstructive pulmonary disease with (acute) exacerbation: Secondary | ICD-10-CM | POA: Diagnosis not present

## 2017-05-30 DIAGNOSIS — I739 Peripheral vascular disease, unspecified: Secondary | ICD-10-CM | POA: Diagnosis not present

## 2017-05-30 DIAGNOSIS — I2510 Atherosclerotic heart disease of native coronary artery without angina pectoris: Secondary | ICD-10-CM | POA: Diagnosis not present

## 2017-05-30 DIAGNOSIS — I10 Essential (primary) hypertension: Secondary | ICD-10-CM | POA: Diagnosis not present

## 2017-05-30 DIAGNOSIS — J189 Pneumonia, unspecified organism: Secondary | ICD-10-CM | POA: Diagnosis not present

## 2017-05-30 DIAGNOSIS — M810 Age-related osteoporosis without current pathological fracture: Secondary | ICD-10-CM | POA: Diagnosis not present

## 2017-05-30 DIAGNOSIS — J440 Chronic obstructive pulmonary disease with acute lower respiratory infection: Secondary | ICD-10-CM | POA: Diagnosis not present

## 2017-05-30 DIAGNOSIS — G8929 Other chronic pain: Secondary | ICD-10-CM | POA: Diagnosis not present

## 2017-05-30 DIAGNOSIS — J441 Chronic obstructive pulmonary disease with (acute) exacerbation: Secondary | ICD-10-CM | POA: Diagnosis not present

## 2017-05-30 DIAGNOSIS — J9611 Chronic respiratory failure with hypoxia: Secondary | ICD-10-CM | POA: Diagnosis not present

## 2017-06-02 DIAGNOSIS — J189 Pneumonia, unspecified organism: Secondary | ICD-10-CM | POA: Diagnosis not present

## 2017-06-02 DIAGNOSIS — J9611 Chronic respiratory failure with hypoxia: Secondary | ICD-10-CM | POA: Diagnosis not present

## 2017-06-02 DIAGNOSIS — J441 Chronic obstructive pulmonary disease with (acute) exacerbation: Secondary | ICD-10-CM | POA: Diagnosis not present

## 2017-06-02 DIAGNOSIS — M810 Age-related osteoporosis without current pathological fracture: Secondary | ICD-10-CM | POA: Diagnosis not present

## 2017-06-02 DIAGNOSIS — G8929 Other chronic pain: Secondary | ICD-10-CM | POA: Diagnosis not present

## 2017-06-02 DIAGNOSIS — I10 Essential (primary) hypertension: Secondary | ICD-10-CM | POA: Diagnosis not present

## 2017-06-02 DIAGNOSIS — I739 Peripheral vascular disease, unspecified: Secondary | ICD-10-CM | POA: Diagnosis not present

## 2017-06-02 DIAGNOSIS — J440 Chronic obstructive pulmonary disease with acute lower respiratory infection: Secondary | ICD-10-CM | POA: Diagnosis not present

## 2017-06-02 DIAGNOSIS — I2510 Atherosclerotic heart disease of native coronary artery without angina pectoris: Secondary | ICD-10-CM | POA: Diagnosis not present

## 2017-06-03 ENCOUNTER — Ambulatory Visit (INDEPENDENT_AMBULATORY_CARE_PROVIDER_SITE_OTHER): Payer: Medicare HMO | Admitting: Family Medicine

## 2017-06-03 ENCOUNTER — Other Ambulatory Visit: Payer: Self-pay | Admitting: Family Medicine

## 2017-06-03 ENCOUNTER — Telehealth: Payer: Self-pay | Admitting: Family Medicine

## 2017-06-03 ENCOUNTER — Other Ambulatory Visit: Payer: Self-pay | Admitting: Pain Medicine

## 2017-06-03 ENCOUNTER — Encounter: Payer: Self-pay | Admitting: Family Medicine

## 2017-06-03 VITALS — BP 112/64 | HR 78 | Temp 98.0°F | Resp 18 | Wt 96.0 lb

## 2017-06-03 DIAGNOSIS — L98491 Non-pressure chronic ulcer of skin of other sites limited to breakdown of skin: Secondary | ICD-10-CM | POA: Diagnosis not present

## 2017-06-03 DIAGNOSIS — I739 Peripheral vascular disease, unspecified: Secondary | ICD-10-CM | POA: Diagnosis not present

## 2017-06-03 DIAGNOSIS — M79604 Pain in right leg: Secondary | ICD-10-CM

## 2017-06-03 DIAGNOSIS — I2510 Atherosclerotic heart disease of native coronary artery without angina pectoris: Secondary | ICD-10-CM | POA: Diagnosis not present

## 2017-06-03 DIAGNOSIS — G8929 Other chronic pain: Secondary | ICD-10-CM | POA: Diagnosis not present

## 2017-06-03 DIAGNOSIS — J189 Pneumonia, unspecified organism: Secondary | ICD-10-CM | POA: Diagnosis not present

## 2017-06-03 DIAGNOSIS — J441 Chronic obstructive pulmonary disease with (acute) exacerbation: Secondary | ICD-10-CM | POA: Diagnosis not present

## 2017-06-03 DIAGNOSIS — J440 Chronic obstructive pulmonary disease with acute lower respiratory infection: Secondary | ICD-10-CM | POA: Diagnosis not present

## 2017-06-03 DIAGNOSIS — I10 Essential (primary) hypertension: Secondary | ICD-10-CM | POA: Diagnosis not present

## 2017-06-03 DIAGNOSIS — J9611 Chronic respiratory failure with hypoxia: Secondary | ICD-10-CM | POA: Diagnosis not present

## 2017-06-03 DIAGNOSIS — G894 Chronic pain syndrome: Secondary | ICD-10-CM

## 2017-06-03 DIAGNOSIS — M810 Age-related osteoporosis without current pathological fracture: Secondary | ICD-10-CM | POA: Diagnosis not present

## 2017-06-03 MED ORDER — ALPRAZOLAM 0.5 MG PO TABS: 0.5000 mg | ORAL_TABLET | Freq: Three times a day (TID) | ORAL | 3 refills | Status: DC | PRN

## 2017-06-03 MED ORDER — CELECOXIB 100 MG PO CAPS: 100.0000 mg | ORAL_CAPSULE | Freq: Two times a day (BID) | ORAL | 1 refills | Status: DC | PRN

## 2017-06-03 MED ORDER — MUPIROCIN 2 % EX OINT: 1.0000 "application " | TOPICAL_OINTMENT | Freq: Two times a day (BID) | CUTANEOUS | 2 refills | Status: DC

## 2017-06-03 MED ORDER — ESTROPIPATE 0.75 MG PO TABS: 0.3750 mg | ORAL_TABLET | ORAL | 3 refills | Status: DC

## 2017-06-03 NOTE — Progress Notes (Signed)
Patient: Cheryl [name] Female    DOB: 1950-12-13   66 y.o.   MRN: [id number] Visit Date: 06/03/2017  Today's Provider: Mila Merry, MD   Chief Complaint  Patient presents with  . Follow-up  . Edema   Subjective:    HPI Edema:  Patient comes in today complaining of intermittent swelling of the lower legs for the past month. Patient was seen in the ER on 05/14/2017 for leg swelling. She had mildly elevated BNP of 137, she was hyponatremic and hypokalemic. She had normal thyroid functions. Chest xray showed cardiomegaly with interstitial edema c/w mild cardiac decompensation. Was given furosemide injection and continued She was scheduled to go to CHF clinic 6/26. 2018 but she did not show. She states she did follow up with Dr. Welton Flakes a few days after her ER visit and had echo and what sounds like cardiac CTA. Patient states that her lower legs are still swelling and have some redness. She has 2 wounds on the lower right leg.     Allergies  Allergen Reactions  . Percocet [Oxycodone-Acetaminophen] Hives and Rash  . Aspirin Nausea And Vomiting and Other (See Comments)    Reaction:  GI upset   . Codeine Nausea And Vomiting and Other (See Comments)    Reaction:  GI upset   . Propoxyphene Other (See Comments) and Nausea Only    GI upset Reaction:  GI upset   . Sulfa Antibiotics Rash and Other (See Comments)    Reaction:  GI upset      Current Outpatient Prescriptions:  .  acetaminophen (TYLENOL) 325 MG tablet, Take 2 tablets (650 mg total) by mouth every 6 (six) hours as needed for mild pain (or Fever >/= 101)., Disp: 30 tablet, Rfl: 0 .  ADVAIR DISKUS 250-50 MCG/DOSE AEPB, inhale 1 dose by mouth twice a day, Disp: 60 each, Rfl: 11 .  albuterol (PROVENTIL HFA;VENTOLIN HFA) 108 (90 BASE) MCG/ACT inhaler, Inhale 2 puffs into the lungs every 4 (four) hours as needed for wheezing or shortness of breath., Disp: 18 g, Rfl: 3 .  albuterol (PROVENTIL) (2.5 MG/3ML) 0.083% nebulizer  solution, Take 3 mLs (2.5 mg total) by nebulization every 4 (four) hours as needed for wheezing., Disp: 75 mL, Rfl: 12 .  ALPRAZolam (XANAX) 0.5 MG tablet, Take 1 tablet (0.5 mg total) by mouth 3 (three) times daily as needed for anxiety., Disp: 15 tablet, Rfl: 0 .  benzonatate (TESSALON PERLES) 100 MG capsule, Take 1 capsule (100 mg total) by mouth 3 (three) times daily as needed for cough., Disp: 20 capsule, Rfl: 1 .  celecoxib (CELEBREX) 100 MG capsule, Take 1 capsule (100 mg total) by mouth 2 (two) times daily as needed. (Patient taking differently: Take 200 mg by mouth daily as needed. ), Disp: 60 capsule, Rfl: 1 .  clopidogrel (PLAVIX) 75 MG tablet, Take 75 mg by mouth daily., Disp: , Rfl:  .  diphenoxylate-atropine (LOMOTIL) 2.5-0.025 MG per tablet, Take 2 tablets by mouth 4 (four) times daily as needed for diarrhea or loose stools. Reported on 03/11/2016, Disp: , Rfl:  .  donepezil (ARICEPT) 10 MG tablet, Take 10 mg by mouth at bedtime., Disp: , Rfl:  .  estropipate (OGEN) 0.75 MG tablet, Take 1 tablet (0.75 mg total) by mouth every other day. (Patient taking differently: Take 0.75 mg by mouth daily. ), Disp: 30 tablet, Rfl: 3 .  fentaNYL (DURAGESIC - DOSED MCG/HR) 50 MCG/HR, Place 1 patch (50 mcg total)  onto the skin every 3 (three) days., Disp: 5 patch, Rfl: 0 .  furosemide (LASIX) 40 MG tablet, Take 40 mg by mouth daily. , Disp: , Rfl:  .  LYRICA 300 MG capsule, Take 300 mg by mouth 2 (two) times daily., Disp: , Rfl: 0 .  pantoprazole (PROTONIX) 40 MG tablet, take 1 tablet by mouth twice a day (Patient taking differently: Take 40 mg by mouth once daily), Disp: 60 tablet, Rfl: 12 .  simvastatin (ZOCOR) 10 MG tablet, Take 10 mg by mouth at bedtime. Reported on 12/04/2015, Disp: , Rfl:  .  SPIRIVA HANDIHALER 18 MCG inhalation capsule, inhale the contents of one capsule in the handihaler once daily, Disp: 30 capsule, Rfl: 12 .  SUMAtriptan (IMITREX) 25 MG tablet, Take 25 mg by mouth as needed for  migraine. May repeat in 2 hours if headache persists or recurs., Disp: , Rfl:  .  traMADol (ULTRAM) 50 MG tablet, Take 2 tablets (100 mg total) by mouth every 6 (six) hours as needed for moderate pain or severe pain., Disp: 20 tablet, Rfl: 0 .  triamcinolone ointment (KENALOG) 0.5 %, Apply to lesions on hands twice a day as needed, Disp: 30 g, Rfl: 1  Review of Systems  Constitutional: Negative for appetite change, chills, diaphoresis, fatigue and fever.  Respiratory: Negative for chest tightness and shortness of breath.   Cardiovascular: Positive for leg swelling. Negative for chest pain and palpitations.  Gastrointestinal: Negative for abdominal pain, nausea and vomiting.  Skin: Positive for color change and wound.  Neurological: Negative for dizziness and weakness.       Off balance    Social History  Substance Use Topics  . Smoking status: Current Every Day Smoker    Packs/day: 0.50    Years: 50.00    Types: Cigarettes  . Smokeless tobacco: Never Used     Comment: Previously smoked 2 ppd  . Alcohol use No   Objective:   BP 112/64 (BP Location: Left Arm, Patient Position: Sitting, Cuff Size: Normal)   Pulse 78   Temp 98 F (36.7 C) (Oral)   Resp 18   Wt 96 lb (43.5 kg)   SpO2 95% Comment: room air  BMI 20.06 kg/m  There were no vitals filed for this visit.   Physical Exam   General Appearance:    Alert, cooperative, no distress  Eyes:    PERRL, conjunctiva/corneas clear, EOM's intact       Lungs:     Clear to auscultation bilaterally, respirations unlabored  Heart:    Regular rate and rhythm  Neurologic:   Awake, alert, oriented x 3. No apparent focal neurological           defect.   Skin:    See media pictures of wounds on anterior and posterior right lower leg.        Assessment & Plan:     1. Pain of right lower extremity Need refill celocoxib - celecoxib (CELEBREX) 100 MG capsule; Take 1 capsule (100 mg total) by mouth 2 (two) times daily as needed.   Dispense: 60 capsule; Refill: 1  2. Skin ulcer, limited to breakdown of skin (HCC) Advised to stop applying peroxide or any other antiseptic. Will start topical daily antibiotic dressing and reassess next week. No sign of surrounding cellulitis.  - mupirocin ointment (BACTROBAN) 2 %; Place 1 application into the nose 2 (two) times daily.  Dispense: 30 g; Refill: 2  3. LE edema  Possible secondary to CHF.  Has mostly resolved now. Continue scheduled follow up with Dr. Welton FlakesKhan.   Counseled of cardiac risk of ERT. Has weaned to 1 QOD and will further wean to 1/2 QOD. Will need to discuss alternative treatments for osteoporosis at follow up.       Mila Merryonald Chetara Kropp, MD  [facility]

## 2017-06-03 NOTE — Telephone Encounter (Signed)
Called into pharmacy and cancelled other Rx's as below.

## 2017-06-03 NOTE — Telephone Encounter (Signed)
Please call in prescription alprazolam to [facility] [address], and cancel all refills that [facility] may [facility] on file.

## 2017-06-03 NOTE — Patient Instructions (Signed)
Clean wounds on legs only with water. Pat dry , then apply prescription antibiotic ointment and cover with bandage. Do no apply any other substances such as alcohol or peroxide to wounds.

## 2017-06-04 ENCOUNTER — Ambulatory Visit: Payer: Medicare HMO | Admitting: Pain Medicine

## 2017-06-05 ENCOUNTER — Ambulatory Visit: Payer: Medicare HMO | Attending: Nurse Practitioner | Admitting: Nurse Practitioner

## 2017-06-05 ENCOUNTER — Encounter: Payer: Self-pay | Admitting: Nurse Practitioner

## 2017-06-05 VITALS — BP 117/49 | HR 78 | Temp 98.6°F | Resp 18 | Ht <= 58 in | Wt 96.0 lb

## 2017-06-05 DIAGNOSIS — J189 Pneumonia, unspecified organism: Secondary | ICD-10-CM | POA: Diagnosis not present

## 2017-06-05 DIAGNOSIS — K219 Gastro-esophageal reflux disease without esophagitis: Secondary | ICD-10-CM | POA: Insufficient documentation

## 2017-06-05 DIAGNOSIS — I739 Peripheral vascular disease, unspecified: Secondary | ICD-10-CM | POA: Diagnosis not present

## 2017-06-05 DIAGNOSIS — M47816 Spondylosis without myelopathy or radiculopathy, lumbar region: Secondary | ICD-10-CM

## 2017-06-05 DIAGNOSIS — I10 Essential (primary) hypertension: Secondary | ICD-10-CM | POA: Diagnosis not present

## 2017-06-05 DIAGNOSIS — M810 Age-related osteoporosis without current pathological fracture: Secondary | ICD-10-CM | POA: Diagnosis not present

## 2017-06-05 DIAGNOSIS — G8929 Other chronic pain: Secondary | ICD-10-CM | POA: Diagnosis not present

## 2017-06-05 DIAGNOSIS — M4696 Unspecified inflammatory spondylopathy, lumbar region: Secondary | ICD-10-CM

## 2017-06-05 DIAGNOSIS — M545 Low back pain: Secondary | ICD-10-CM | POA: Diagnosis present

## 2017-06-05 DIAGNOSIS — M479 Spondylosis, unspecified: Secondary | ICD-10-CM | POA: Diagnosis not present

## 2017-06-05 DIAGNOSIS — J9611 Chronic respiratory failure with hypoxia: Secondary | ICD-10-CM | POA: Diagnosis not present

## 2017-06-05 DIAGNOSIS — Z885 Allergy status to narcotic agent status: Secondary | ICD-10-CM | POA: Insufficient documentation

## 2017-06-05 DIAGNOSIS — I2510 Atherosclerotic heart disease of native coronary artery without angina pectoris: Secondary | ICD-10-CM | POA: Diagnosis not present

## 2017-06-05 DIAGNOSIS — G894 Chronic pain syndrome: Secondary | ICD-10-CM | POA: Diagnosis not present

## 2017-06-05 DIAGNOSIS — F1290 Cannabis use, unspecified, uncomplicated: Secondary | ICD-10-CM | POA: Insufficient documentation

## 2017-06-05 DIAGNOSIS — Z79891 Long term (current) use of opiate analgesic: Secondary | ICD-10-CM | POA: Diagnosis not present

## 2017-06-05 DIAGNOSIS — J440 Chronic obstructive pulmonary disease with acute lower respiratory infection: Secondary | ICD-10-CM | POA: Diagnosis not present

## 2017-06-05 DIAGNOSIS — J441 Chronic obstructive pulmonary disease with (acute) exacerbation: Secondary | ICD-10-CM | POA: Diagnosis not present

## 2017-06-05 MED ORDER — TRAMADOL HCL 50 MG PO TABS: 100.0000 mg | ORAL_TABLET | Freq: Four times a day (QID) | ORAL | 0 refills | Status: DC | PRN

## 2017-06-05 MED ORDER — FENTANYL 50 MCG/HR TD PT72: 50.0000 ug | MEDICATED_PATCH | TRANSDERMAL | 0 refills | Status: DC

## 2017-06-05 NOTE — Progress Notes (Signed)
Patient's Name: Cheryl [name]  MRN: [id number]  Referring Provider: Birdie Sons, MD  DOB: [birth year]  PCP: Birdie Sons, MD  DOS: 06/05/2017  Note by: Vevelyn Francois NP  Service setting: Ambulatory outpatient  Specialty: Interventional Pain Management  Location: [facility] (AMB) Pain Management Facility    Patient type: Established    Primary Reason(s) for Visit: Encounter for prescription drug management. (Level of risk: moderate)  CC: Back Pain (low)  HPI  Cheryl [name] is a 66 y.o. year old, female patient, who comes today for a medication management evaluation. She has Essential hypertension; GERD (gastroesophageal reflux disease); Hyperlipemia; Anxiety; Airway hyperreactivity; Back pain, thoracic; Coronary artery disease; Excessive falling; Alteration in bowel elimination: incontinence; Insomnia; Decreased testosterone level; Leg weakness; Menopausal symptom; Migraine; Neuropathy ([place]); Fecal occult blood test positive; OP (osteoporosis); Panic disorder; Compulsive tobacco user syndrome; Urinary incontinence; Vitamin D deficiency; Weight loss; COPD (chronic obstructive pulmonary disease) ([place]); Dementia; Lumbar radicular pain (Bilateral) (L>R) (L4); Chronic low back pain (Location of Primary Source of Pain) (Bilateral) (L>R); Encounter for therapeutic drug level monitoring; Uncomplicated opioid dependence ([place]); Chronic pain syndrome; Hypercholesteremia; Peripheral nerve disease; Peripheral vascular disease ([place]); Platelet inhibition due to Plavix; Abnormal mammogram of right breast; Dilated intrahepatic bile duct; Sepsis ([place]); Dysphagia; Acute diastolic CHF (congestive heart failure) ([place]); Leukocytosis; Long term current use of opiate analgesic; Fibromyalgia; Osteoarthrosis; Long term current use of anticoagulant therapy (Plavix); Opiate use (160 MME/Day); Anemia; Lumbar facet syndrome (Location of Primary Source of Pain) (Bilateral) (L>R); Lumbar spondylosis; Nausea with vomiting;  Protein-calorie malnutrition, severe; Metabolic encephalopathy; Hypokalemia; Acute postoperative pain; Pneumonia; and Marijuana use on her problem list. Her primarily concern today is the Back Pain (low)  Pain Assessment: Location: Lower Back Radiating: denies Onset: More than a month ago Duration: Chronic pain Quality: Aching, Constant, Sharp Severity: 3 /10 (self-reported pain score)  Note: Reported level is compatible with observation.                   Effect on ADL:   Timing: Constant Modifying factors: medications  Cheryl [name] was last scheduled for an appointment on 03/06/2017 for medication management. During today's appointment we reviewed Cheryl [name]'s chronic pain status, as well as her outpatient medication regimen. She is in today with her sister and granddaughter. She denies any radicular pain, numbness or tingling.  The patient  reports that she does not use drugs. Her body mass index is 20.06 kg/m.  Further details on both, my assessment(s), as well as the proposed treatment plan, please see below.  Controlled Substance Pharmacotherapy Assessment REMS (Risk Evaluation and Mitigation Strategy)  Analgesic:Duragesic 50 mcg/h every 72 hours + Tramadol 100 mg PO q6hrs (400 mg/day) MME/day:160 mg/day.   Cheryl Shorter, RN  06/05/2017  1:28 PM  Signed Nursing Pain Medication Assessment:  Safety precautions to be maintained throughout the outpatient stay will include: orient to surroundings, keep bed in low position, maintain call bell within reach at all times, provide assistance with transfer out of bed and ambulation.  Medication Inspection Compliance: Pill count conducted under aseptic conditions, in front of the patient. Neither the pills nor the bottle was removed from the patient's sight at any time. Once count was completed pills were immediately returned to the patient in their original bottle.  Medication #1: Tramadol (Ultram) Pill/Patch Count: 142 of 240 pills  remain Pill/Patch Appearance: Markings consistent with prescribed medication Bottle Appearance: Standard pharmacy container. Clearly labeled. Filled Date: 06 / 26 / 2018 Last Medication  intake:  Today  Medication #2: Fentanyl patch Pill/Patch Count: 9 of 10 pills remain Pill/Patch Appearance: Markings consistent with prescribed medication Bottle Appearance: Standard pharmacy container. Clearly labeled. Filled Date:07 /10 / 2018 Last Medication intake:  Yesterday   Pharmacokinetics: Liberation and absorption (onset of action): WNL Distribution (time to peak effect): WNL Metabolism and excretion (duration of action): WNL         Pharmacodynamics: Desired effects: Analgesia: Cheryl [name] reports >50% benefit. Functional ability: Patient reports that medication allows her to accomplish basic ADLs Clinically meaningful improvement in function (CMIF): Sustained CMIF goals met Perceived effectiveness: Described as relatively effective, allowing for increase in activities of daily living (ADL) Undesirable effects: Side-effects or Adverse reactions: None reported Monitoring: [place] PMP: Online review of the past 28-monthperiod conducted. Compliant with practice rules and regulations List of all UDS test(s) done:  Lab Results  Component Value Date   TOXASSSELUR FINAL 12/05/2016   [place] 03/20/2016   [place] 02/28/2016   [place] 12/04/2015   Last UDS on record: ToxAssure Select 13  Date Value Ref Range Status  12/05/2016 FINAL  Final    Comment:    ==================================================================== TOXASSURE SELECT 13 (MW) ==================================================================== Test                             Result       Flag       Units Drug Present and Declared for Prescription Verification   Fentanyl                       34           EXPECTED   ng/mg creat   Norfentanyl                    258          EXPECTED   ng/mg  creat    Source of fentanyl is a scheduled prescription medication,    including IV, patch, and transmucosal formulations. Norfentanyl    is an expected metabolite of fentanyl.   Tramadol                       PRESENT      EXPECTED   O-Desmethyltramadol            PRESENT      EXPECTED   N-Desmethyltramadol            PRESENT      EXPECTED    Source of tramadol is a prescription medication.    O-desmethyltramadol and N-desmethyltramadol are expected    metabolites of tramadol. Drug Absent but Declared for Prescription Verification   Alprazolam                     Not Detected UNEXPECTED ng/mg creat ==================================================================== Test                      Result    Flag   Units      Ref Range   Creatinine              197              mg/dL      >=20 ==================================================================== Declared Medications:  The flagging and interpretation on this report are based on the  following declared medications.  Unexpected results may arise from  inaccuracies in the declared medications.  **Note: The testing scope of this panel includes these medications:  Alprazolam (Xanax)  Fentanyl (Duragesic)  Tramadol (Ultram)  **Note: The testing scope of this panel does not include following  reported medications:  Acetaminophen  Albuterol  Albuterol (Proventil)  Atropine (Lomotil)  Celecoxib (Celebrex)  Clopidogrel (Plavix)  Cyclobenzaprine (Flexeril)  Diphenoxylate (Lomotil)  Donepezil (Aricept)  Estropipate  Fluticasone (Advair)  Fluticasone (Flonase)  Furosemide (Lasix)  Mupirocin (Bactroban)  Pantoprazole  Potassium  Pregabalin (Lyrica)  Salmeterol (Advair)  Simvastatin (Zocor)  Sumatriptan (Imitrex)  Tiotropium (Spiriva)  Triamcinolone (Kenalog) ==================================================================== For clinical consultation, please call [phone]. ====================================================================    UDS interpretation: Compliant          Medication Assessment Form: Reviewed. Patient indicates being compliant with therapy Treatment compliance: Compliant Risk Assessment Profile: Aberrant behavior: appearance of intoxicatin or being "high" observe with the sister that accompanied the patient. Unable to keep her eyes open, dry mouth.  Comorbid factors increasing risk of overdose: See prior notes. No additional risks detected today Risk of substance use disorder (SUD): High Opioid Risk Tool (ORT) Total Score:    Interpretation Table:  Score <3 = Low Risk for SUD  Score between 4-7 = Moderate Risk for SUD  Score >8 = High Risk for Opioid Abuse   Risk Mitigation Strategies:  Patient Counseling: Covered Patient-Prescriber Agreement (PPA): Present and active  Notification to other healthcare providers: Done  Pharmacologic Plan: No change in therapy, at this time  Laboratory Chemistry  Inflammation Markers (CRP: Acute Phase) (ESR: Chronic Phase) Lab Results  Component Value Date   CRP 0.6 03/01/2016   ESRSEDRATE 52 (H) 03/01/2016                 Renal Function Markers Lab Results  Component Value Date   BUN 18 05/14/2017   CREATININE 0.96 05/14/2017   GFRAA >60 05/14/2017   GFRNONAA 59 (L) 05/14/2017                 Hepatic Function Markers Lab Results  Component Value Date   AST 49 (H) 05/14/2017   ALT 30 05/14/2017   ALBUMIN 4.1 05/14/2017   ALKPHOS 146 (H) 05/14/2017                 Electrolytes Lab Results  Component Value Date   NA 132 (L) 05/14/2017   K 3.4 (L) 05/14/2017   CL 92 (L) 05/14/2017   CALCIUM 8.4 (L) 05/14/2017   MG 1.9 04/29/2017                 Neuropathy Markers Lab Results  Component Value Date   VITAMINB12 242 03/01/2016                 Bone Pathology Markers Lab Results  Component Value Date   ALKPHOS 146 (H) 05/14/2017   VD25OH 25.6 (L)  01/17/2016   CALCIUM 8.4 (L) 05/14/2017                 Coagulation Parameters Lab Results  Component Value Date   INR 0.97 11/05/2016   LABPROT 12.9 11/05/2016   APTT 38 (H) 10/17/2015   PLT 329 05/14/2017                 Cardiovascular Markers Lab Results  Component Value Date   BNP 137.0 (H) 05/14/2017   HGB 9.1 (L) 05/14/2017   HCT 27.9 (L) 05/14/2017  Note: Lab results reviewed.  Recent Diagnostic Imaging Review  Dg Chest 2 View  Result Date: 05/14/2017 CLINICAL DATA:  Bilateral lower extremity swelling and pain which began yesterday. History of asthma -COPD, current smoker, coronary artery disease. EXAM: CHEST  2 VIEW COMPARISON:  Portable chest x-ray of April 29, 2017 and PA and lateral chest x-ray of January 28, 2017. FINDINGS: The lungs are well-expanded. The interstitial markings are increased bilaterally. There is lingular atelectasis or pneumonia. The cardiac silhouette is enlarged. The pulmonary vascularity is mildly prominent. There is calcification in the wall of the aortic arch. There is a moderate-sized hiatal hernia. There is no pleural effusion. There is prominent thoracic kyphosis. IMPRESSION: COPD. Lingular consolidation compatible with pneumonia. Cardiomegaly with interstitial edema bilaterally consistent with mild cardiac decompensation. Thoracic aortic atherosclerosis. Electronically Signed   By: David  Martinique M.D.   On: 05/14/2017 14:13   US Venous Img Lower Bilateral  Result Date: 05/14/2017 CLINICAL DATA:  Bilateral leg swelling, pain EXAM: BILATERAL LOWER EXTREMITY VENOUS DOPPLER ULTRASOUND TECHNIQUE: Gray-scale sonography with graded compression, as well as color Doppler and duplex ultrasound were performed to evaluate the lower extremity deep venous systems from the level of the common femoral vein and including the common femoral, femoral, profunda femoral, popliteal and calf veins including the posterior tibial, peroneal and gastrocnemius veins  when visible. The superficial great saphenous vein was also interrogated. Spectral Doppler was utilized to evaluate flow at rest and with distal augmentation maneuvers in the common femoral, femoral and popliteal veins. COMPARISON:  08/20/2014 FINDINGS: RIGHT LOWER EXTREMITY Common Femoral Vein: No evidence of thrombus. Normal compressibility, respiratory phasicity and response to augmentation. Saphenofemoral Junction: No evidence of thrombus. Normal compressibility and flow on color Doppler imaging. Profunda Femoral Vein: No evidence of thrombus. Normal compressibility and flow on color Doppler imaging. Femoral Vein: No evidence of thrombus. Normal compressibility, respiratory phasicity and response to augmentation. Popliteal Vein: No evidence of thrombus. Normal compressibility, respiratory phasicity and response to augmentation. Calf Veins: No evidence of thrombus. Normal compressibility and flow on color Doppler imaging. Superficial Great Saphenous Vein: No evidence of thrombus. Normal compressibility and flow on color Doppler imaging. Venous Reflux:  None. Other Findings:  None. LEFT LOWER EXTREMITY Common Femoral Vein: No evidence of thrombus. Normal compressibility, respiratory phasicity and response to augmentation. Saphenofemoral Junction: No evidence of thrombus. Normal compressibility and flow on color Doppler imaging. Profunda Femoral Vein: No evidence of thrombus. Normal compressibility and flow on color Doppler imaging. Femoral Vein: No evidence of thrombus. Normal compressibility, respiratory phasicity and response to augmentation. Popliteal Vein: No evidence of thrombus. Normal compressibility, respiratory phasicity and response to augmentation. Calf Veins: Limited visualization.  No definite thrombosis. Superficial Great Saphenous Vein: No evidence of thrombus. Normal compressibility and flow on color Doppler imaging. Venous Reflux:  None. Other Findings:  None. IMPRESSION: No evidence of DVT within  either lower extremity. Electronically Signed   By: Rolm Baptise M.D.   On: 05/14/2017 13:29   Note: Imaging results reviewed.          Meds   Current Meds  Medication Sig  . acetaminophen (TYLENOL) 325 MG tablet Take 2 tablets (650 mg total) by mouth every 6 (six) hours as needed for mild pain (or Fever >/= 101).  . ADVAIR DISKUS 250-50 MCG/DOSE AEPB inhale 1 dose by mouth twice a day  . albuterol (PROVENTIL HFA;VENTOLIN HFA) 108 (90 BASE) MCG/ACT inhaler Inhale 2 puffs into the lungs every 4 (four) hours as needed for wheezing  or shortness of breath.  Marland Kitchen albuterol (PROVENTIL) (2.5 MG/3ML) 0.083% nebulizer solution Take 3 mLs (2.5 mg total) by nebulization every 4 (four) hours as needed for wheezing.  Marland Kitchen ALPRAZolam (XANAX) 0.5 MG tablet Take 1 tablet (0.5 mg total) by mouth 3 (three) times daily as needed for anxiety.  . celecoxib (CELEBREX) 100 MG capsule Take 1 capsule (100 mg total) by mouth 2 (two) times daily as needed.  . clopidogrel (PLAVIX) 75 MG tablet Take 75 mg by mouth daily.  . diphenoxylate-atropine (LOMOTIL) 2.5-0.025 MG per tablet Take 2 tablets by mouth 4 (four) times daily as needed for diarrhea or loose stools. Reported on 03/11/2016  . donepezil (ARICEPT) 10 MG tablet Take 10 mg by mouth at bedtime.  Marland Kitchen estropipate (OGEN) 0.75 MG tablet Take 0.5 tablets (0.375 mg total) by mouth every other day.  Derrill Memo ON 07/03/2017] fentaNYL (DURAGESIC - DOSED MCG/HR) 50 MCG/HR Place 1 patch (50 mcg total) onto the skin every 3 (three) days.  . furosemide (LASIX) 40 MG tablet Take 40 mg by mouth daily.   Marland Kitchen LYRICA 300 MG capsule Take 300 mg by mouth 2 (two) times daily.  . mupirocin ointment (BACTROBAN) 2 % Place 1 application into the nose 2 (two) times daily.  . pantoprazole (PROTONIX) 40 MG tablet take 1 tablet by mouth twice a day (Patient taking differently: Take 40 mg by mouth once daily)  . pregabalin (LYRICA) 300 MG capsule take 1 capsule by mouth twice a day  . simvastatin (ZOCOR)  10 MG tablet Take 10 mg by mouth at bedtime. Reported on 12/04/2015  . SPIRIVA HANDIHALER 18 MCG inhalation capsule inhale the contents of one capsule in the handihaler once daily  . SUMAtriptan (IMITREX) 25 MG tablet Take 25 mg by mouth as needed for migraine. May repeat in 2 hours if headache persists or recurs.  Derrill Memo ON 06/17/2017] traMADol (ULTRAM) 50 MG tablet Take 2 tablets (100 mg total) by mouth every 6 (six) hours as needed for moderate pain or severe pain.  Marland Kitchen triamcinolone ointment (KENALOG) 0.5 % Apply to lesions on hands twice a day as needed  . [DISCONTINUED] fentaNYL (DURAGESIC - DOSED MCG/HR) 50 MCG/HR Place 1 patch (50 mcg total) onto the skin every 3 (three) days.  . [DISCONTINUED] fentaNYL (DURAGESIC - DOSED MCG/HR) 50 MCG/HR Place 1 patch (50 mcg total) onto the skin every 3 (three) days.  . [DISCONTINUED] fentaNYL (DURAGESIC - DOSED MCG/HR) 50 MCG/HR Place 1 patch (50 mcg total) onto the skin every 3 (three) days.  . [DISCONTINUED] traMADol (ULTRAM) 50 MG tablet Take 2 tablets (100 mg total) by mouth every 6 (six) hours as needed for moderate pain or severe pain.  . [DISCONTINUED] traMADol (ULTRAM) 50 MG tablet Take 2 tablets (100 mg total) by mouth every 6 (six) hours as needed for moderate pain or severe pain.  . [DISCONTINUED] traMADol (ULTRAM) 50 MG tablet Take 2 tablets (100 mg total) by mouth every 6 (six) hours as needed for moderate pain or severe pain.    ROS  Constitutional: Denies any fever or chills Gastrointestinal: No reported hemesis, hematochezia, vomiting, or acute GI distress Musculoskeletal: Denies any acute onset joint swelling, redness, loss of ROM, or weakness Neurological: No reported episodes of acute onset apraxia, aphasia, dysarthria, agnosia, amnesia, paralysis, loss of coordination, or loss of consciousness  Allergies  Ms. Varnell is allergic to percocet [oxycodone-acetaminophen]; aspirin; codeine; propoxyphene; and sulfa antibiotics.  [place]  Drug:  Ms. Poplar  reports that she  does not use drugs. Alcohol:  reports that she does not drink alcohol. Tobacco:  reports that she has been smoking Cigarettes.  She has a 25.00 pack-year smoking history. She has never used smokeless tobacco. Medical:  has a past medical history of Allergy; Anxiety; Arthritis; Aspiration pneumonitis ([place]) (11/24/2015); Asthma; Chronic pain; COPD (chronic obstructive pulmonary disease) ([place]); Coronary artery disease; DVT (deep venous thrombosis) ([place]); GERD (gastroesophageal reflux disease); Headache; Hyperlipidemia; Hypertension; Migraines; Neuropathy (2010); Osteoporosis; Oxygen deficiency; Peripheral vascular disease ([place]); Pneumonia; Pneumonia (11/19/2015); Pneumonia (10/2016); and Vitamin D deficiency. Surgical: Ms. Cerritos  has a past surgical history that includes Appendectomy; Spine surgery; Foot surgery (Bilateral); cardiac catherization (10/31/2009); abdomnal aortic stent (05/30/2008); Abdominal hysterectomy (1975); Cholecystectomy (1972); Cervical fusion (C5 - 6/C6-7); Appendectomy; Colonoscopy with propofol (N/A, 07/27/2015); and Esophagogastroduodenoscopy (egd) with propofol (N/A, 07/27/2015). Family: family history includes Arthritis in her mother; Cancer in her brother, brother, and mother; Diabetes in her brother, mother, and sister; Heart disease in her father and mother.  Constitutional Exam  General appearance: Well nourished, well developed, and well hydrated. In no apparent acute distress Vitals:   06/05/17 1317  BP: (!) 117/49  Pulse: 78  Resp: 18  Temp: 98.6 F (37 C)  SpO2: 98%  Weight: 96 lb (43.5 kg)  Height: 4' 10" (1.473 m)   BMI Assessment: Estimated body mass index is 20.06 kg/m as calculated from the following:   Height as of this encounter: 4' 10" (1.473 m).   Weight as of this encounter: 96 lb (43.5 kg).  BMI interpretation table: BMI level Category Range association with higher incidence of chronic pain  <18 kg/m2 Underweight    18.5-24.9 kg/m2 Ideal body weight   25-29.9 kg/m2 Overweight Increased incidence by 20%  30-34.9 kg/m2 Obese (Class I) Increased incidence by 68%  35-39.9 kg/m2 Severe obesity (Class II) Increased incidence by 136%  >40 kg/m2 Extreme obesity (Class III) Increased incidence by 254%   BMI Readings from Last 4 Encounters:  06/05/17 20.06 kg/m  06/03/17 20.06 kg/m  05/14/17 19.02 kg/m  05/02/17 19.12 kg/m   Wt Readings from Last 4 Encounters:  06/05/17 96 lb (43.5 kg)  06/03/17 96 lb (43.5 kg)  05/14/17 91 lb (41.3 kg)  05/02/17 91 lb 8 oz (41.5 kg)  Psych/Mental status: Alert, oriented x 3 (person, place, & time)       Eyes: PERLA Respiratory: No evidence of acute respiratory distress  Cervical Spine Exam  Inspection: No masses, redness, or swelling Alignment: Symmetrical Functional ROM: Unrestricted ROM      Stability: No instability detected Muscle strength & Tone: Functionally intact Sensory: Unimpaired Palpation: No palpable anomalies              Upper Extremity (UE) Exam    Side: Right upper extremity  Side: Left upper extremity  Inspection: No masses, redness, swelling, or asymmetry. No contractures  Inspection: No masses, redness, swelling, or asymmetry. No contractures  Functional ROM: Unrestricted ROM          Functional ROM: Unrestricted ROM          Muscle strength & Tone: Functionally intact  Muscle strength & Tone: Functionally intact  Sensory: Unimpaired  Sensory: Unimpaired  Palpation: No palpable anomalies              Palpation: No palpable anomalies              Specialized Test(s): Deferred         Specialized Test(s): Deferred  Thoracic Spine Exam  Inspection: No masses, redness, or swelling Alignment: Symmetrical Functional ROM: Unrestricted ROM Stability: No instability detected Sensory: Unimpaired Muscle strength & Tone: No palpable anomalies  Lumbar Spine Exam  Inspection: No masses, redness, or swelling Alignment:  Symmetrical Functional ROM: Unrestricted ROM      Stability: No instability detected Muscle strength & Tone: Functionally intact Sensory: Unimpaired Palpation: Complains of area being tender to palpation       Provocative Tests: Lumbar Hyperextension and rotation test: evaluation deferred today       Patrick's Maneuver: evaluation deferred today                    Gait & Posture Assessment  Ambulation: Unassisted Gait: Relatively normal for age and body habitus Posture: WNL   Lower Extremity Exam    Side: Right lower extremity  Side: Left lower extremity  Inspection: Some redness observed with edema  Inspection: Some redness observed with edema  Functional ROM: Unrestricted ROM          Functional ROM: Unrestricted ROM          Muscle strength & Tone: Functionally intact  Muscle strength & Tone: Functionally intact  Sensory: Unimpaired  Sensory: Unimpaired  Palpation: No palpable anomalies  Palpation: No palpable anomalies   Assessment  Primary Diagnosis & Pertinent Problem List: The primary encounter diagnosis was Lumbar facet syndrome (Location of Primary Source of Pain) (Bilateral) (L>R). Diagnoses of Lumbar spondylosis, Chronic pain syndrome, Long term current use of opiate analgesic, and Marijuana use were also pertinent to this visit.  Status Diagnosis  Controlled Controlled Controlled 1. Lumbar facet syndrome (Location of Primary Source of Pain) (Bilateral) (L>R)   2. Lumbar spondylosis   3. Chronic pain syndrome   4. Long term current use of opiate analgesic   5. Marijuana use     Problems updated and reviewed during this visit: Problem  Lumbar facet syndrome (Location of Primary Source of Pain) (Bilateral) (L>R)  Lumbar spondylosis  Osteoarthrosis  Fibromyalgia  Lumbar radicular pain (Bilateral) (L>R) (L4)  Chronic low back pain (Location of Primary Source of Pain) (Bilateral) (L>R)  Chronic Pain Syndrome  Back Pain, Thoracic  Neuropathy ([facility])   work up per Dr.  Manuella Ghazi   Marijuana Use  Opiate use (160 MME/Day)  Long term current use of anticoagulant therapy (Plavix)  Long Term Current Use of Opiate Analgesic  Encounter for Therapeutic Drug Level Monitoring  Uncomplicated Opioid Dependence ([facility])  Platelet Inhibition Due to Plavix  Copd (Chronic Obstructive Pulmonary Disease) ([facility])  Pneumonia  Acute Postoperative Pain  Metabolic Encephalopathy  Hypokalemia  Protein-calorie malnutrition, severe  Nausea With Vomiting  Anemia  Dysphagia  Acute Diastolic Chf (Congestive Heart Failure) ([facility])  Leukocytosis  Sepsis ([facility])  Abnormal Mammogram of Right Breast  Dilated Intrahepatic Bile Duct  Dementia  Airway Hyperreactivity  Excessive Falling  Alteration in Bowel Elimination: Incontinence  Insomnia  Decreased Testosterone Level   Undetectable level on labs done by Dr. Consuela Mimes   Leg Weakness  Menopausal Symptom  Migraine  Fecal Occult Blood Test Positive  Op (Osteoporosis)  Panic Disorder  Compulsive Tobacco User Syndrome  Urinary Incontinence  Weight Loss  Essential Hypertension  Gerd (Gastroesophageal Reflux Disease)  Hyperlipemia  Peripheral nerve disease   Workup per Dr. Manuella Ghazi   Anxiety  Coronary Artery Disease   Occluded posterior descending coronary arteries. Minimal plaque (<25% stenosis) of left main, proximal-mid-distal LAD,  proximal-distal circumflex, OM1, Proximal-mid-distal RCA per CTA by Dr. Humphrey Rolls  06/04/13   Hypercholesteremia  Peripheral Vascular Disease (Hcc)   S/p distal abdominal aorta stenting 2009   Vitamin D Deficiency   Baseline vitamin D level=7.3 10-16-2009    Plan of Care  Pharmacotherapy (Medications Ordered): Meds ordered this encounter  Medications  . traMADol (ULTRAM) 50 MG tablet    Sig: Take 2 tablets (100 mg total) by mouth every 6 (six) hours as needed for moderate pain or severe pain.    Dispense:  240 tablet    Refill:  0    Patient may have prescription filled one day early if pharmacy is  closed on scheduled refill date. Do not fill until: 06/17/17 To last until: 09/15/17    Order Specific Question:   Supervising Provider    Answer:   Milinda Pointer [phone]  . fentaNYL (DURAGESIC - DOSED MCG/HR) 50 MCG/HR    Sig: Place 1 patch (50 mcg total) onto the skin every 3 (three) days.    Dispense:  10 patch    Refill:  0    Patient may have prescription filled one day early if pharmacy is closed on scheduled refill date. Do not fill until: 07/03/2017 To last until: 08/02/2017    Order Specific Question:   Supervising Provider    Answer:   Milinda Pointer [phone]   New Prescriptions   No medications on file   Medications administered today: Ms. Dalpe had no medications administered during this visit. Lab-work, procedure(s), and/or referral(s): Orders Placed This Encounter  Procedures  . Radiofrequency,Lumbar  . ToxASSURE Select 13 (MW), Urine   Imaging and/or referral(s): None  Interventional therapies: Planned, scheduled, and/or pending:   Right sided lumbar facet radiofrequency ablation with sedation Only 1 fentanyl prescription given today secondary to urine drug screens which was explained to the patient she will follow up in 6 weeks for evaluation of her urine for compliance    Considering:   Bilateral lumbar facet radiofrequency ablation    Palliative PRN treatment(s):   Palliative bilateral lumbar facet block under fluoroscopic guidance and IV sedation   Provider-requested follow-up: Return in about 6 weeks (around 07/17/2017) for w/ Dr. Dossie Arbour, ([facility]).  Future Appointments Date Time Provider [place]  06/13/2017 11:00 AM Birdie Sons, MD [facility] None  07/14/2017 1:15 PM Milinda Pointer, MD [facility] None   Primary Care Physician: Birdie Sons, MD Location: [facility] Note by: Vevelyn Francois NP Date: 06/05/2017; Time: 3:24 PM  Pain Score Disclaimer: We use the NRS-11 scale. This is a  self-reported, subjective measurement of pain severity with only modest accuracy. It is used primarily to identify changes within a particular patient. It must be understood that outpatient pain scales are significantly less accurate that those used for research, where they can be applied under ideal controlled circumstances with minimal exposure to variables. In reality, the score is likely to be a combination of pain intensity and pain affect, where pain affect describes the degree of emotional arousal or changes in action readiness caused by the sensory experience of pain. Factors such as social and work situation, setting, emotional state, anxiety levels, expectation, and prior pain experience may influence pain perception and show large inter-individual differences that may also be affected by time variables.  Patient instructions provided during this appointment: Patient Instructions   ____________________________________________________________________________________________  Medication Rules  Applies to: All patients receiving prescriptions (written or electronic).  Pharmacy of record: Pharmacy where electronic prescriptions will be sent. If written prescriptions are taken to a different  pharmacy, please inform the nursing staff. The pharmacy listed in the electronic medical record should be the one where you would like electronic prescriptions to be sent.  Prescription refills: Only during scheduled appointments. Applies to both, written and electronic prescriptions.  NOTE: The following applies primarily to controlled substances (Opioid* Pain Medications).   Patient's responsibilities: 1. Pain Pills: Bring all pain pills to every appointment (except for procedure appointments). 2. Pill Bottles: Bring pills in original pharmacy bottle. Always bring newest bottle. Bring bottle, even if empty. 3. Medication refills: You are responsible for knowing and keeping track of what medications you  need refilled. The day before your appointment, write a list of all prescriptions that need to be refilled. Bring that list to your appointment and give it to the admitting nurse. Prescriptions will be written only during appointments. If you forget a medication, it will not be "Called in", "Faxed", or "electronically sent". You will need to get another appointment to get these prescribed. 4. Prescription Accuracy: You are responsible for carefully inspecting your prescriptions before leaving our office. Have the discharge nurse carefully go over each prescription with you, before taking them home. Make sure that your name is accurately spelled, that your address is correct. Check the name and dose of your medication to make sure it is accurate. Check the number of pills, and the written instructions to make sure they are clear and accurate. Make sure that you are given enough medication to last until your next medication refill appointment. 5. Taking Medication: Take medication as prescribed. Never take more pills than instructed. Never take medication more frequently than prescribed. Taking less pills or less frequently is permitted and encouraged, when it comes to controlled substances (written prescriptions).  6. Inform other Doctors: Always inform, all of your healthcare providers, of all the medications you take. 7. Pain Medication from other Providers: You are not allowed to accept any additional pain medication from any other Doctor or Healthcare provider. There are two exceptions to this rule. (see below) In the event that you require additional pain medication, you are responsible for notifying us, as stated below. 8. Medication Agreement: You are responsible for carefully reading and following our Medication Agreement. This must be signed before receiving any prescriptions from our practice. Safely store a copy of your signed Agreement. Violations to the Agreement will result in no further  prescriptions. (Additional copies of our Medication Agreement are available upon request.) 9. Laws, Rules, & Regulations: All patients are expected to follow all Federal and [employer], Rules, [employer]. Ignorance of the Laws does not constitute a valid excuse. The use of any illegal substances is prohibited. 10. Adopted CDC guidelines & recommendations: Target dosing levels will be at or below 60 MME/day. Use of benzodiazepines** is not recommended.  Exceptions: There are only two exceptions to the rule of not receiving pain medications from other Healthcare Providers. 1. Exception #1 (Emergencies): In the event of an emergency (i.e.: accident requiring emergency care), you are allowed to receive additional pain medication. However, you are responsible for: As soon as you are able, call our office [phone], at any time of the day or night, and leave a message stating your name, the date and nature of the emergency, and the name and dose of the medication prescribed. In the event that your call is answered by a member of our staff, make sure to document and save the date, time, and the name of the person that took your  information.  2. Exception #2 (Planned Surgery): In the event that you are scheduled by another doctor or dentist to have any type of surgery or procedure, you are allowed (for a period no longer than 30 days), to receive additional pain medication, for the acute post-op pain. However, in this case, you are responsible for picking up a copy of our "Post-op Pain Management for Surgeons" handout, and giving it to your surgeon or dentist. This document is available at our office, and does not require an appointment to obtain it. Simply go to our office during business hours (Monday-Thursday from 8:00 AM to 4:00 PM) (Friday 8:00 AM to 12:00 Noon) or if you have a scheduled appointment with [place], prior to your surgery, and ask for it by name. In addition, you will need to provide [place]  with your name, name of your surgeon, type of surgery, and date of procedure or surgery.  *Opioid medications include: morphine, codeine, oxycodone, oxymorphone, hydrocodone, hydromorphone, meperidine, tramadol, tapentadol, buprenorphine, fentanyl, methadone. **Benzodiazepine medications include: diazepam (Valium), alprazolam (Xanax), clonazepam (Klonopine), lorazepam (Ativan), clorazepate (Tranxene), chlordiazepoxide (Librium), estazolam (Prosom), oxazepam (Serax), temazepam (Restoril), triazolam (Halcion)  ____________________________________________________________________________________________

## 2017-06-05 NOTE — Patient Instructions (Signed)

## 2017-06-05 NOTE — Progress Notes (Signed)
Nursing Pain Medication Assessment:  Safety precautions to be maintained throughout the outpatient stay will include: orient to surroundings, keep bed in low position, maintain call bell within reach at all times, provide assistance with transfer out of bed and ambulation.  Medication Inspection Compliance: Pill count conducted under aseptic conditions, in front of the patient. Neither the pills nor the bottle was removed from the patient's sight at any time. Once count was completed pills were immediately returned to the patient in their original bottle.  Medication #1: Tramadol (Ultram) Pill/Patch Count: 142 of 240 pills remain Pill/Patch Appearance: Markings consistent with prescribed medication Bottle Appearance: Standard pharmacy container. Clearly labeled. Filled Date: 06 / 26 / 2018 Last Medication intake:  Today  Medication #2: Fentanyl patch Pill/Patch Count: 9 of 10 pills remain Pill/Patch Appearance: Markings consistent with prescribed medication Bottle Appearance: Standard pharmacy container. Clearly labeled. Filled Date:07 /10 / 2018 Last Medication intake:  Burgess EstelleYesterday

## 2017-06-06 DIAGNOSIS — J9611 Chronic respiratory failure with hypoxia: Secondary | ICD-10-CM | POA: Diagnosis not present

## 2017-06-06 DIAGNOSIS — J441 Chronic obstructive pulmonary disease with (acute) exacerbation: Secondary | ICD-10-CM | POA: Diagnosis not present

## 2017-06-06 DIAGNOSIS — J440 Chronic obstructive pulmonary disease with acute lower respiratory infection: Secondary | ICD-10-CM | POA: Diagnosis not present

## 2017-06-06 DIAGNOSIS — I739 Peripheral vascular disease, unspecified: Secondary | ICD-10-CM | POA: Diagnosis not present

## 2017-06-06 DIAGNOSIS — G8929 Other chronic pain: Secondary | ICD-10-CM | POA: Diagnosis not present

## 2017-06-06 DIAGNOSIS — J189 Pneumonia, unspecified organism: Secondary | ICD-10-CM | POA: Diagnosis not present

## 2017-06-06 DIAGNOSIS — M810 Age-related osteoporosis without current pathological fracture: Secondary | ICD-10-CM | POA: Diagnosis not present

## 2017-06-06 DIAGNOSIS — I10 Essential (primary) hypertension: Secondary | ICD-10-CM | POA: Diagnosis not present

## 2017-06-06 DIAGNOSIS — I2510 Atherosclerotic heart disease of native coronary artery without angina pectoris: Secondary | ICD-10-CM | POA: Diagnosis not present

## 2017-06-09 DIAGNOSIS — I2510 Atherosclerotic heart disease of native coronary artery without angina pectoris: Secondary | ICD-10-CM | POA: Diagnosis not present

## 2017-06-09 DIAGNOSIS — J440 Chronic obstructive pulmonary disease with acute lower respiratory infection: Secondary | ICD-10-CM | POA: Diagnosis not present

## 2017-06-09 DIAGNOSIS — J189 Pneumonia, unspecified organism: Secondary | ICD-10-CM | POA: Diagnosis not present

## 2017-06-09 DIAGNOSIS — M810 Age-related osteoporosis without current pathological fracture: Secondary | ICD-10-CM | POA: Diagnosis not present

## 2017-06-09 DIAGNOSIS — I10 Essential (primary) hypertension: Secondary | ICD-10-CM | POA: Diagnosis not present

## 2017-06-09 DIAGNOSIS — J9611 Chronic respiratory failure with hypoxia: Secondary | ICD-10-CM | POA: Diagnosis not present

## 2017-06-09 DIAGNOSIS — I739 Peripheral vascular disease, unspecified: Secondary | ICD-10-CM | POA: Diagnosis not present

## 2017-06-09 DIAGNOSIS — J441 Chronic obstructive pulmonary disease with (acute) exacerbation: Secondary | ICD-10-CM | POA: Diagnosis not present

## 2017-06-09 DIAGNOSIS — G8929 Other chronic pain: Secondary | ICD-10-CM | POA: Diagnosis not present

## 2017-06-12 DIAGNOSIS — I10 Essential (primary) hypertension: Secondary | ICD-10-CM | POA: Diagnosis not present

## 2017-06-12 DIAGNOSIS — I739 Peripheral vascular disease, unspecified: Secondary | ICD-10-CM | POA: Diagnosis not present

## 2017-06-12 DIAGNOSIS — J441 Chronic obstructive pulmonary disease with (acute) exacerbation: Secondary | ICD-10-CM | POA: Diagnosis not present

## 2017-06-12 DIAGNOSIS — J9611 Chronic respiratory failure with hypoxia: Secondary | ICD-10-CM | POA: Diagnosis not present

## 2017-06-12 DIAGNOSIS — I2510 Atherosclerotic heart disease of native coronary artery without angina pectoris: Secondary | ICD-10-CM | POA: Diagnosis not present

## 2017-06-12 DIAGNOSIS — M810 Age-related osteoporosis without current pathological fracture: Secondary | ICD-10-CM | POA: Diagnosis not present

## 2017-06-12 DIAGNOSIS — J189 Pneumonia, unspecified organism: Secondary | ICD-10-CM | POA: Diagnosis not present

## 2017-06-12 DIAGNOSIS — J440 Chronic obstructive pulmonary disease with acute lower respiratory infection: Secondary | ICD-10-CM | POA: Diagnosis not present

## 2017-06-12 DIAGNOSIS — G8929 Other chronic pain: Secondary | ICD-10-CM | POA: Diagnosis not present

## 2017-06-13 ENCOUNTER — Ambulatory Visit (INDEPENDENT_AMBULATORY_CARE_PROVIDER_SITE_OTHER): Payer: Medicare HMO | Admitting: Family Medicine

## 2017-06-13 ENCOUNTER — Encounter: Payer: Self-pay | Admitting: Family Medicine

## 2017-06-13 VITALS — BP 130/70 | HR 71 | Temp 98.4°F | Resp 16 | Wt 94.0 lb

## 2017-06-13 DIAGNOSIS — L98491 Non-pressure chronic ulcer of skin of other sites limited to breakdown of skin: Secondary | ICD-10-CM

## 2017-06-13 DIAGNOSIS — J449 Chronic obstructive pulmonary disease, unspecified: Secondary | ICD-10-CM | POA: Diagnosis not present

## 2017-06-13 DIAGNOSIS — M810 Age-related osteoporosis without current pathological fracture: Secondary | ICD-10-CM | POA: Diagnosis not present

## 2017-06-13 DIAGNOSIS — L03115 Cellulitis of right lower limb: Secondary | ICD-10-CM | POA: Diagnosis not present

## 2017-06-13 LAB — TOXASSURE SELECT 13 (MW), URINE

## 2017-06-13 MED ORDER — ALENDRONATE SODIUM 70 MG PO TABS: 70.0000 mg | ORAL_TABLET | ORAL | 11 refills | Status: DC

## 2017-06-13 NOTE — Progress Notes (Signed)
Patient: Cheryl [name] Female    DOB: [birth year]   66 y.o.   MRN: [id number] Visit Date: 06/13/2017  Today's Provider: Mila Merryonald Jamilette Suchocki, MD   Chief Complaint  Patient presents with  . Follow-up   Subjective:    HPI Follow up:  Patient was seen in the office 10 days ago for skin ulcer on the back of her right leg. Patient was prescribed a topical daily antibiotic (Bactroban) and advised to follow up in 1 week. Patient was also advised to stop applying peroxide or any other antiseptic. Patient comes in reporting that she has been applying the topica antibiotic, but new sores are popping up on her legs. Patient has continued to use peroxide to clean the sores. Patient states the still have drainage and redness.     Allergies  Allergen Reactions  . Percocet [Oxycodone-Acetaminophen] Hives and Rash  . Aspirin Nausea And Vomiting and Other (See Comments)    Reaction:  GI upset   . Codeine Nausea And Vomiting, Other (See Comments) and Nausea Only    Reaction:  GI upset   . Propoxyphene Other (See Comments) and Nausea Only    GI upset Reaction:  GI upset   . Sulfa Antibiotics Rash and Other (See Comments)    Reaction:  GI upset      Current Outpatient Prescriptions:  .  acetaminophen (TYLENOL) 325 MG tablet, Take 2 tablets (650 mg total) by mouth every 6 (six) hours as needed for mild pain (or Fever >/= 101)., Disp: 30 tablet, Rfl: 0 .  ADVAIR DISKUS 250-50 MCG/DOSE AEPB, inhale 1 dose by mouth twice a day, Disp: 60 each, Rfl: 11 .  albuterol (PROVENTIL HFA;VENTOLIN HFA) 108 (90 BASE) MCG/ACT inhaler, Inhale 2 puffs into the lungs every 4 (four) hours as needed for wheezing or shortness of breath., Disp: 18 g, Rfl: 3 .  albuterol (PROVENTIL) (2.5 MG/3ML) 0.083% nebulizer solution, Take 3 mLs (2.5 mg total) by nebulization every 4 (four) hours as needed for wheezing., Disp: 75 mL, Rfl: 12 .  ALPRAZolam (XANAX) 0.5 MG tablet, Take 1 tablet (0.5 mg total) by mouth 3 (three) times  daily as needed for anxiety., Disp: 60 tablet, Rfl: 3 .  celecoxib (CELEBREX) 100 MG capsule, Take 1 capsule (100 mg total) by mouth 2 (two) times daily as needed., Disp: 60 capsule, Rfl: 1 .  clopidogrel (PLAVIX) 75 MG tablet, Take 75 mg by mouth daily., Disp: , Rfl:  .  diphenoxylate-atropine (LOMOTIL) 2.5-0.025 MG per tablet, Take 2 tablets by mouth 4 (four) times daily as needed for diarrhea or loose stools. Reported on 03/11/2016, Disp: , Rfl:  .  donepezil (ARICEPT) 10 MG tablet, Take 10 mg by mouth at bedtime., Disp: , Rfl:  .  estropipate (OGEN) 0.75 MG tablet, Take 0.5 tablets (0.375 mg total) by mouth every other day., Disp: 30 tablet, Rfl: 3 .  [START ON 07/03/2017] fentaNYL (DURAGESIC - DOSED MCG/HR) 50 MCG/HR, Place 1 patch (50 mcg total) onto the skin every 3 (three) days., Disp: 10 patch, Rfl: 0 .  furosemide (LASIX) 40 MG tablet, Take 40 mg by mouth daily. , Disp: , Rfl:  .  LYRICA 300 MG capsule, Take 300 mg by mouth 2 (two) times daily., Disp: , Rfl: 0 .  mupirocin ointment (BACTROBAN) 2 %, Place 1 application into the nose 2 (two) times daily., Disp: 30 g, Rfl: 2 .  pantoprazole (PROTONIX) 40 MG tablet, take 1 tablet by mouth twice a  day (Patient taking differently: Take 40 mg by mouth once daily), Disp: 60 tablet, Rfl: 12 .  pregabalin (LYRICA) 300 MG capsule, take 1 capsule by mouth twice a day, Disp: , Rfl:  .  simvastatin (ZOCOR) 10 MG tablet, Take 10 mg by mouth at bedtime. Reported on 12/04/2015, Disp: , Rfl:  .  SPIRIVA HANDIHALER 18 MCG inhalation capsule, inhale the contents of one capsule in the handihaler once daily, Disp: 30 capsule, Rfl: 12 .  SUMAtriptan (IMITREX) 25 MG tablet, Take 25 mg by mouth as needed for migraine. May repeat in 2 hours if headache persists or recurs., Disp: , Rfl:  .  [START ON 06/17/2017] traMADol (ULTRAM) 50 MG tablet, Take 2 tablets (100 mg total) by mouth every 6 (six) hours as needed for moderate pain or severe pain., Disp: 240 tablet, Rfl: 0 .   triamcinolone ointment (KENALOG) 0.5 %, Apply to lesions on hands twice a day as needed, Disp: 30 g, Rfl: 1  Review of Systems  Constitutional: Negative for appetite change, chills, fatigue and fever.  Respiratory: Negative for chest tightness and shortness of breath.   Cardiovascular: Negative for chest pain and palpitations.  Gastrointestinal: Negative for abdominal pain, nausea and vomiting.  Skin: Positive for color change and wound.  Neurological: Negative for dizziness and weakness.    Social History  Substance Use Topics  . Smoking status: Current Every Day Smoker    Packs/day: 0.50    Years: 50.00    Types: Cigarettes  . Smokeless tobacco: Never Used     Comment: Previously smoked 2 ppd  . Alcohol use No   Objective:   BP 130/70 (BP Location: Left Arm, Patient Position: Sitting, Cuff Size: Normal)   Pulse 71   Temp 98.4 F (36.9 C) (Oral)   Resp 16   Wt 94 lb (42.6 kg)   SpO2 96% Comment: room air  BMI 19.65 kg/m  There were no vitals filed for this visit.   Physical Exam  Several small about 3/4cm x 0.72mm deep erosions mostly scabbed over on both legs, many of these lesions new since last visit. No erythema around lesions. Scan clear discharge.     Assessment & Plan:     1. Skin ulcer, limited to breakdown of skin (HCC) Concerned about possible MRSA with new lesions continuing to erupt. Continue Bactroban and advised not to apply any OTC antiseptics including peroxide and alcohol.  - Wound culture  2.  Osteoporosis, unspecified osteoporosis type, unspecified pathological fracture presence She is not currently on a bisphosphonate and we are getting her off of ERT. Start: - alendronate (FOSAMAX) 70 MG tablet; Take 1 tablet (70 mg total) by mouth every 7 (seven) days. Take with a full glass of water on an empty stomach.  Dispense: 4 tablet; Refill: 11       Mila Merry, MD  [facility]

## 2017-06-15 DIAGNOSIS — J449 Chronic obstructive pulmonary disease, unspecified: Secondary | ICD-10-CM | POA: Diagnosis not present

## 2017-06-16 DIAGNOSIS — G8929 Other chronic pain: Secondary | ICD-10-CM | POA: Diagnosis not present

## 2017-06-16 DIAGNOSIS — I739 Peripheral vascular disease, unspecified: Secondary | ICD-10-CM | POA: Diagnosis not present

## 2017-06-16 DIAGNOSIS — J9611 Chronic respiratory failure with hypoxia: Secondary | ICD-10-CM | POA: Diagnosis not present

## 2017-06-16 DIAGNOSIS — I10 Essential (primary) hypertension: Secondary | ICD-10-CM | POA: Diagnosis not present

## 2017-06-16 DIAGNOSIS — J440 Chronic obstructive pulmonary disease with acute lower respiratory infection: Secondary | ICD-10-CM | POA: Diagnosis not present

## 2017-06-16 DIAGNOSIS — M810 Age-related osteoporosis without current pathological fracture: Secondary | ICD-10-CM | POA: Diagnosis not present

## 2017-06-16 DIAGNOSIS — I2510 Atherosclerotic heart disease of native coronary artery without angina pectoris: Secondary | ICD-10-CM | POA: Diagnosis not present

## 2017-06-16 DIAGNOSIS — J441 Chronic obstructive pulmonary disease with (acute) exacerbation: Secondary | ICD-10-CM | POA: Diagnosis not present

## 2017-06-16 DIAGNOSIS — J189 Pneumonia, unspecified organism: Secondary | ICD-10-CM | POA: Diagnosis not present

## 2017-06-16 LAB — WOUND CULTURE: ORGANISM ID, BACTERIA: NONE SEEN

## 2017-06-18 ENCOUNTER — Ambulatory Visit: Payer: Medicare HMO | Admitting: Pain Medicine

## 2017-06-19 DIAGNOSIS — J9611 Chronic respiratory failure with hypoxia: Secondary | ICD-10-CM | POA: Diagnosis not present

## 2017-06-19 DIAGNOSIS — J189 Pneumonia, unspecified organism: Secondary | ICD-10-CM | POA: Diagnosis not present

## 2017-06-19 DIAGNOSIS — J440 Chronic obstructive pulmonary disease with acute lower respiratory infection: Secondary | ICD-10-CM | POA: Diagnosis not present

## 2017-06-19 DIAGNOSIS — J441 Chronic obstructive pulmonary disease with (acute) exacerbation: Secondary | ICD-10-CM | POA: Diagnosis not present

## 2017-06-19 DIAGNOSIS — I739 Peripheral vascular disease, unspecified: Secondary | ICD-10-CM | POA: Diagnosis not present

## 2017-06-19 DIAGNOSIS — I2510 Atherosclerotic heart disease of native coronary artery without angina pectoris: Secondary | ICD-10-CM | POA: Diagnosis not present

## 2017-06-19 DIAGNOSIS — I10 Essential (primary) hypertension: Secondary | ICD-10-CM | POA: Diagnosis not present

## 2017-06-19 DIAGNOSIS — G8929 Other chronic pain: Secondary | ICD-10-CM | POA: Diagnosis not present

## 2017-06-19 DIAGNOSIS — M810 Age-related osteoporosis without current pathological fracture: Secondary | ICD-10-CM | POA: Diagnosis not present

## 2017-06-23 DIAGNOSIS — J9611 Chronic respiratory failure with hypoxia: Secondary | ICD-10-CM | POA: Diagnosis not present

## 2017-06-23 DIAGNOSIS — J189 Pneumonia, unspecified organism: Secondary | ICD-10-CM | POA: Diagnosis not present

## 2017-06-23 DIAGNOSIS — M810 Age-related osteoporosis without current pathological fracture: Secondary | ICD-10-CM | POA: Diagnosis not present

## 2017-06-23 DIAGNOSIS — J441 Chronic obstructive pulmonary disease with (acute) exacerbation: Secondary | ICD-10-CM | POA: Diagnosis not present

## 2017-06-23 DIAGNOSIS — I2510 Atherosclerotic heart disease of native coronary artery without angina pectoris: Secondary | ICD-10-CM | POA: Diagnosis not present

## 2017-06-23 DIAGNOSIS — G8929 Other chronic pain: Secondary | ICD-10-CM | POA: Diagnosis not present

## 2017-06-23 DIAGNOSIS — I739 Peripheral vascular disease, unspecified: Secondary | ICD-10-CM | POA: Diagnosis not present

## 2017-06-23 DIAGNOSIS — I10 Essential (primary) hypertension: Secondary | ICD-10-CM | POA: Diagnosis not present

## 2017-06-23 DIAGNOSIS — J440 Chronic obstructive pulmonary disease with acute lower respiratory infection: Secondary | ICD-10-CM | POA: Diagnosis not present

## 2017-06-25 ENCOUNTER — Ambulatory Visit: Payer: Medicare HMO | Admitting: Pain Medicine

## 2017-06-26 DIAGNOSIS — J189 Pneumonia, unspecified organism: Secondary | ICD-10-CM | POA: Diagnosis not present

## 2017-06-26 DIAGNOSIS — R0602 Shortness of breath: Secondary | ICD-10-CM | POA: Diagnosis not present

## 2017-06-26 DIAGNOSIS — J449 Chronic obstructive pulmonary disease, unspecified: Secondary | ICD-10-CM | POA: Diagnosis not present

## 2017-06-26 DIAGNOSIS — Z515 Encounter for palliative care: Secondary | ICD-10-CM | POA: Diagnosis not present

## 2017-06-26 DIAGNOSIS — G8929 Other chronic pain: Secondary | ICD-10-CM | POA: Diagnosis not present

## 2017-07-01 DIAGNOSIS — J9611 Chronic respiratory failure with hypoxia: Secondary | ICD-10-CM | POA: Diagnosis not present

## 2017-07-01 DIAGNOSIS — J441 Chronic obstructive pulmonary disease with (acute) exacerbation: Secondary | ICD-10-CM | POA: Diagnosis not present

## 2017-07-01 DIAGNOSIS — I2510 Atherosclerotic heart disease of native coronary artery without angina pectoris: Secondary | ICD-10-CM | POA: Diagnosis not present

## 2017-07-01 DIAGNOSIS — J189 Pneumonia, unspecified organism: Secondary | ICD-10-CM | POA: Diagnosis not present

## 2017-07-01 DIAGNOSIS — M810 Age-related osteoporosis without current pathological fracture: Secondary | ICD-10-CM | POA: Diagnosis not present

## 2017-07-01 DIAGNOSIS — I739 Peripheral vascular disease, unspecified: Secondary | ICD-10-CM | POA: Diagnosis not present

## 2017-07-01 DIAGNOSIS — I10 Essential (primary) hypertension: Secondary | ICD-10-CM | POA: Diagnosis not present

## 2017-07-01 DIAGNOSIS — J440 Chronic obstructive pulmonary disease with acute lower respiratory infection: Secondary | ICD-10-CM | POA: Diagnosis not present

## 2017-07-01 DIAGNOSIS — G8929 Other chronic pain: Secondary | ICD-10-CM | POA: Diagnosis not present

## 2017-07-04 ENCOUNTER — Ambulatory Visit (INDEPENDENT_AMBULATORY_CARE_PROVIDER_SITE_OTHER): Payer: Medicare HMO

## 2017-07-04 ENCOUNTER — Ambulatory Visit (INDEPENDENT_AMBULATORY_CARE_PROVIDER_SITE_OTHER): Payer: Medicare HMO | Admitting: Family Medicine

## 2017-07-04 VITALS — BP 132/70 | HR 68 | Temp 98.7°F | Ht 61.0 in | Wt 95.2 lb

## 2017-07-04 DIAGNOSIS — G8929 Other chronic pain: Secondary | ICD-10-CM | POA: Diagnosis not present

## 2017-07-04 DIAGNOSIS — S51019A Laceration without foreign body of unspecified elbow, initial encounter: Secondary | ICD-10-CM

## 2017-07-04 DIAGNOSIS — E559 Vitamin D deficiency, unspecified: Secondary | ICD-10-CM | POA: Diagnosis not present

## 2017-07-04 DIAGNOSIS — J440 Chronic obstructive pulmonary disease with acute lower respiratory infection: Secondary | ICD-10-CM | POA: Diagnosis not present

## 2017-07-04 DIAGNOSIS — M810 Age-related osteoporosis without current pathological fracture: Secondary | ICD-10-CM | POA: Diagnosis not present

## 2017-07-04 DIAGNOSIS — Z1159 Encounter for screening for other viral diseases: Secondary | ICD-10-CM

## 2017-07-04 DIAGNOSIS — J189 Pneumonia, unspecified organism: Secondary | ICD-10-CM | POA: Diagnosis not present

## 2017-07-04 DIAGNOSIS — J441 Chronic obstructive pulmonary disease with (acute) exacerbation: Secondary | ICD-10-CM | POA: Diagnosis not present

## 2017-07-04 DIAGNOSIS — L98491 Non-pressure chronic ulcer of skin of other sites limited to breakdown of skin: Secondary | ICD-10-CM | POA: Diagnosis not present

## 2017-07-04 DIAGNOSIS — I10 Essential (primary) hypertension: Secondary | ICD-10-CM | POA: Diagnosis not present

## 2017-07-04 DIAGNOSIS — J9611 Chronic respiratory failure with hypoxia: Secondary | ICD-10-CM | POA: Diagnosis not present

## 2017-07-04 DIAGNOSIS — I739 Peripheral vascular disease, unspecified: Secondary | ICD-10-CM | POA: Diagnosis not present

## 2017-07-04 DIAGNOSIS — Z0000 Encounter for general adult medical examination without abnormal findings: Secondary | ICD-10-CM

## 2017-07-04 DIAGNOSIS — I2510 Atherosclerotic heart disease of native coronary artery without angina pectoris: Secondary | ICD-10-CM | POA: Diagnosis not present

## 2017-07-04 NOTE — Progress Notes (Signed)
Patient: Cheryl [name] Female    DOB: 12-06-1950   66 y.o.   MRN: [id number] Visit Date: 07/04/2017  Today's Provider: Mila Merry, MD   Chief Complaint  Patient presents with  . Follow-up   Subjective:    HPI  Skin ulcer, limited to breakdown of skin (HCC) From 06/13/2017- Continue Bactroban and advised not to apply any OTC antiseptics including peroxide and alcohol. Wound culture done showing negative.   Wounds have improved. Still some redness but wounds have scabbed over.   She was also seen for AWV today by [facility]. Going to lab for hep c screening and is due for vitamin d check. She states she is no longer taking vitamin d supplement.  Lab Results  Component Value Date   VD25OH 25.6 (L) 01/17/2016      Allergies  Allergen Reactions  . Percocet [Oxycodone-Acetaminophen] Hives and Rash  . Aspirin Nausea And Vomiting and Other (See Comments)    Reaction:  GI upset   . Codeine Nausea And Vomiting, Other (See Comments) and Nausea Only    Reaction:  GI upset   . Propoxyphene Other (See Comments) and Nausea Only    GI upset Reaction:  GI upset   . Sulfa Antibiotics Rash and Other (See Comments)    Reaction:  GI upset      Current Outpatient Prescriptions:  .  acetaminophen (TYLENOL) 325 MG tablet, Take 2 tablets (650 mg total) by mouth every 6 (six) hours as needed for mild pain (or Fever >/= 101)., Disp: 30 tablet, Rfl: 0 .  ADVAIR DISKUS 250-50 MCG/DOSE AEPB, inhale 1 dose by mouth twice a day, Disp: 60 each, Rfl: 11 .  albuterol (PROVENTIL HFA;VENTOLIN HFA) 108 (90 BASE) MCG/ACT inhaler, Inhale 2 puffs into the lungs every 4 (four) hours as needed for wheezing or shortness of breath., Disp: 18 g, Rfl: 3 .  albuterol (PROVENTIL) (2.5 MG/3ML) 0.083% nebulizer solution, Take 3 mLs (2.5 mg total) by nebulization every 4 (four) hours as needed for wheezing., Disp: 75 mL, Rfl: 12 .  alendronate (FOSAMAX) 70 MG tablet, Take 1 tablet (70 mg total) by mouth every 7  (seven) days. Take with a full glass of water on an empty stomach., Disp: 4 tablet, Rfl: 11 .  ALPRAZolam (XANAX) 0.5 MG tablet, Take 1 tablet (0.5 mg total) by mouth 3 (three) times daily as needed for anxiety., Disp: 60 tablet, Rfl: 3 .  celecoxib (CELEBREX) 100 MG capsule, Take 1 capsule (100 mg total) by mouth 2 (two) times daily as needed., Disp: 60 capsule, Rfl: 1 .  clopidogrel (PLAVIX) 75 MG tablet, Take 75 mg by mouth daily., Disp: , Rfl:  .  diphenoxylate-atropine (LOMOTIL) 2.5-0.025 MG per tablet, Take 2 tablets by mouth 4 (four) times daily as needed for diarrhea or loose stools. Reported on 03/11/2016, Disp: , Rfl:  .  donepezil (ARICEPT) 10 MG tablet, Take 10 mg by mouth at bedtime., Disp: , Rfl:  .  estropipate (OGEN) 0.75 MG tablet, Take 0.5 tablets (0.375 mg total) by mouth every other day., Disp: 30 tablet, Rfl: 3 .  fentaNYL (DURAGESIC - DOSED MCG/HR) 50 MCG/HR, Place 1 patch (50 mcg total) onto the skin every 3 (three) days., Disp: 10 patch, Rfl: 0 .  furosemide (LASIX) 40 MG tablet, Take 40 mg by mouth every other day. , Disp: , Rfl:  .  LYRICA 300 MG capsule, Take 300 mg by mouth 2 (two) times daily., Disp: , Rfl:  0 .  mupirocin ointment (BACTROBAN) 2 %, Place 1 application into the nose 2 (two) times daily., Disp: 30 g, Rfl: 2 .  pantoprazole (PROTONIX) 40 MG tablet, take 1 tablet by mouth twice a day (Patient taking differently: Take 40 mg by mouth once daily), Disp: 60 tablet, Rfl: 12 .  simvastatin (ZOCOR) 10 MG tablet, Take 10 mg by mouth at bedtime. Reported on 12/04/2015, Disp: , Rfl:  .  SPIRIVA HANDIHALER 18 MCG inhalation capsule, inhale the contents of one capsule in the handihaler once daily, Disp: 30 capsule, Rfl: 12 .  SUMAtriptan (IMITREX) 25 MG tablet, Take 25 mg by mouth as needed for migraine. May repeat in 2 hours if headache persists or recurs., Disp: , Rfl:  .  traMADol (ULTRAM) 50 MG tablet, Take 2 tablets (100 mg total) by mouth every 6 (six) hours as needed  for moderate pain or severe pain., Disp: 240 tablet, Rfl: 0 .  triamcinolone ointment (KENALOG) 0.5 %, Apply to lesions on hands twice a day as needed, Disp: 30 g, Rfl: 1  Review of Systems  Constitutional: Negative for appetite change, chills, fatigue and fever.  Respiratory: Negative for chest tightness and shortness of breath.   Cardiovascular: Negative for chest pain and palpitations.  Gastrointestinal: Negative for abdominal pain, nausea and vomiting.  Neurological: Negative for dizziness and weakness.    Social History  Substance Use Topics  . Smoking status: Current Every Day Smoker    Packs/day: 0.50    Years: 50.00    Types: Cigarettes  . Smokeless tobacco: Never Used     Comment: Previously smoked 2 ppd  . Alcohol use No   Objective:   Vital Signs - Last Recorded  Most recent update: 07/04/2017 11:11 AM by Hyacinth MeekerMarkoski, McKenzie, LPN  BP  161/09132/70 (BP Location: Left Arm)     Pulse  68     Temp  98.7 F (37.1 C) (Oral)     Ht  5\' 1"  (1.549 m)     Wt  95 lb 3.2 oz (43.2 kg)      BMI  17.99 kg/m      Vitals History  BMI and BSA Data   Body Mass Index: 17.99 kg/m Body Surface Area: 1.36 m      Physical Exam  Skin: several small approx 7-10cm round sores scattered over both legs with no discharge. Smaller in size than at previous visit with no surroundin erythema.   6x8cm skin tear dorsum right hand. No discharge. No erythema.     Assessment & Plan:     1. Skin ulcer, limited to breakdown of skin (HCC) Improving since last exam, continue Bactroban application daily. Likely exacerbated by poor general health. Encourage nutritional supplements.   2. Skin tear of elbow without complication, unspecified laterality, initial encounter Advised to apply neosporin ointment with daily dressing change. Injury just occurred this morning when she fell and hit back of hand on furniture. Counseled regarding s/s of infection and to call if any develop.   3.  Vitamin D deficiency  - Vitamin D 1,25 dihydroxy       Mila Merryonald Esthela Brandner, MD  [facility]

## 2017-07-04 NOTE — Progress Notes (Signed)
Subjective:   Cheryl [name] is a 66 y.o. female who presents for Medicare Annual (Subsequent) preventive examination.  Review of Systems:  N/A  Cardiac Risk Factors include: advanced age (>44men, >52 women);dyslipidemia     Objective:     Vitals: BP 132/70 (BP Location: Left Arm)   Pulse 68   Temp 98.7 F (37.1 C) (Oral)   Ht 5\' 1"  (1.549 m)   Wt 95 lb 3.2 oz (43.2 kg)   BMI 17.99 kg/m   Body mass index is 17.99 kg/m.   Tobacco History  Smoking Status  . Current Every Day Smoker  . Packs/day: 0.50  . Years: 50.00  . Types: Cigarettes  Smokeless Tobacco  . Never Used    Comment: Previously smoked 2 ppd     Ready to quit: Not Answered Counseling given: Not Answered   Past Medical History:  Diagnosis Date  . Allergy   . Anxiety   . Arthritis   . Aspiration pneumonitis (HCC) 11/24/2015  . Asthma   . Chronic pain   . COPD (chronic obstructive pulmonary disease) (HCC)   . Coronary artery disease   . DVT (deep venous thrombosis) (HCC)   . GERD (gastroesophageal reflux disease)   . Headache   . Hyperlipidemia   . Hypertension   . Migraines   . Neuropathy 2010  . Osteoporosis   . Oxygen deficiency   . Peripheral vascular disease (HCC)   . Pneumonia   . Pneumonia 11/19/2015  . Pneumonia 10/2016  . Vitamin D deficiency    Past Surgical History:  Procedure Laterality Date  . ABDOMINAL HYSTERECTOMY  1975   Bilaterl Oophorectomy; Dur to IUD infection  . abdomnal aortic stent  05/30/2008   Dr. Nanetta Batty  . APPENDECTOMY    . APPENDECTOMY    . cardiac catherization  10/31/2009  . CERVICAL FUSION  C5 - 6/C6-7  . CHOLECYSTECTOMY  1972  . COLONOSCOPY WITH PROPOFOL N/A 07/27/2015   Procedure: COLONOSCOPY WITH PROPOFOL;  Surgeon: Wallace Cullens, MD;  Location: [facility] ENDOSCOPY;  Service: Gastroenterology;  Laterality: N/A;  . ESOPHAGOGASTRODUODENOSCOPY (EGD) WITH PROPOFOL N/A 07/27/2015   Procedure: ESOPHAGOGASTRODUODENOSCOPY (EGD) WITH PROPOFOL;  Surgeon:  Wallace Cullens, MD;  Location: [facility] ENDOSCOPY;  Service: Gastroenterology;  Laterality: N/A;  . FOOT SURGERY Bilateral    5-6 years per patient  . SPINE SURGERY     Family History  Problem Relation Age of Onset  . Cancer Mother   . Arthritis Mother   . Heart disease Mother   . Diabetes Mother        mellitus, type 2  . Heart disease Father   . Diabetes Sister   . Cancer Brother   . Cancer Brother        lung  . Diabetes Brother    History  Sexual Activity  . Sexual activity: Not on file    Outpatient Encounter Prescriptions as of 07/04/2017  Medication Sig  . acetaminophen (TYLENOL) 325 MG tablet Take 2 tablets (650 mg total) by mouth every 6 (six) hours as needed for mild pain (or Fever >/= 101).  . ADVAIR DISKUS 250-50 MCG/DOSE AEPB inhale 1 dose by mouth twice a day  . albuterol (PROVENTIL HFA;VENTOLIN HFA) 108 (90 BASE) MCG/ACT inhaler Inhale 2 puffs into the lungs every 4 (four) hours as needed for wheezing or shortness of breath.  Marland Kitchen albuterol (PROVENTIL) (2.5 MG/3ML) 0.083% nebulizer solution Take 3 mLs (2.5 mg total) by nebulization every 4 (four) hours as  needed for wheezing.  Marland Kitchen alendronate (FOSAMAX) 70 MG tablet Take 1 tablet (70 mg total) by mouth every 7 (seven) days. Take with a full glass of water on an empty stomach.  . ALPRAZolam (XANAX) 0.5 MG tablet Take 1 tablet (0.5 mg total) by mouth 3 (three) times daily as needed for anxiety.  . celecoxib (CELEBREX) 100 MG capsule Take 1 capsule (100 mg total) by mouth 2 (two) times daily as needed.  . clopidogrel (PLAVIX) 75 MG tablet Take 75 mg by mouth daily.  . diphenoxylate-atropine (LOMOTIL) 2.5-0.025 MG per tablet Take 2 tablets by mouth 4 (four) times daily as needed for diarrhea or loose stools. Reported on 03/11/2016  . donepezil (ARICEPT) 10 MG tablet Take 10 mg by mouth at bedtime.  Marland Kitchen estropipate (OGEN) 0.75 MG tablet Take 0.5 tablets (0.375 mg total) by mouth every other day.  . fentaNYL (DURAGESIC - DOSED MCG/HR) 50  MCG/HR Place 1 patch (50 mcg total) onto the skin every 3 (three) days.  . furosemide (LASIX) 40 MG tablet Take 40 mg by mouth every other day.   Marland Kitchen LYRICA 300 MG capsule Take 300 mg by mouth 2 (two) times daily.  . mupirocin ointment (BACTROBAN) 2 % Place 1 application into the nose 2 (two) times daily.  . pantoprazole (PROTONIX) 40 MG tablet take 1 tablet by mouth twice a day (Patient taking differently: Take 40 mg by mouth once daily)  . simvastatin (ZOCOR) 10 MG tablet Take 10 mg by mouth at bedtime. Reported on 12/04/2015  . SPIRIVA HANDIHALER 18 MCG inhalation capsule inhale the contents of one capsule in the handihaler once daily  . SUMAtriptan (IMITREX) 25 MG tablet Take 25 mg by mouth as needed for migraine. May repeat in 2 hours if headache persists or recurs.  . traMADol (ULTRAM) 50 MG tablet Take 2 tablets (100 mg total) by mouth every 6 (six) hours as needed for moderate pain or severe pain.  Marland Kitchen triamcinolone ointment (KENALOG) 0.5 % Apply to lesions on hands twice a day as needed  . [DISCONTINUED] pregabalin (LYRICA) 300 MG capsule take 1 capsule by mouth twice a day   No facility-administered encounter medications on file as of 07/04/2017.     Activities of Daily Living In your present state of health, do you have any difficulty performing the following activities: 07/04/2017 04/29/2017  Hearing? N N  Vision? Y N  Difficulty concentrating or making decisions? N Y  Walking or climbing stairs? Y Y  Dressing or bathing? N Y  Doing errands, shopping? N N  Preparing Food and eating ? N -  Using the Toilet? N -  In the past six months, have you accidently leaked urine? Y -  Comment wears protection -  Do you have problems with loss of bowel control? Y -  Comment on lomotil -  Managing your Medications? N -  Managing your Finances? Y -  Housekeeping or managing your Housekeeping? N -  Some recent data might be hidden    Patient Care Team: Malva Limes, MD as PCP - General  (Family Medicine) Delano Metz, MD (Pain Medicine) Laurier Nancy, MD as Consulting Physician (Cardiology) Kathe Becton, MD as Referring Physician (Gastroenterology) Lonell Face, MD as Consulting Physician (Neurology)    Assessment:     Exercise Activities and Dietary recommendations Current Exercise Habits: Home exercise routine (PT comes to home), Type of exercise: walking;stretching, Time (Minutes): 60, Frequency (Times/Week): 2, Weekly Exercise (Minutes/Week): 120, Intensity: Mild,  Exercise limited by: orthopedic condition(s)  Goals    . Increase water intake          Recommend increasing water intake to 3 glasses a day.       Fall Risk Fall Risk  07/04/2017 06/05/2017 04/15/2017 04/14/2017 02/05/2017  Falls in the past year? Yes Yes Yes Yes Yes  Comment - - - - -  Number falls in past yr: 2 or more 1 2 or more 1 1  Injury with Fall? Yes No Yes No No  Comment broken finger, cuts, bruises - per patient she has skin tears frequently and has bumped her head with a knot on her forehead - -  Risk Factor Category  - - High Fall Risk - -  Risk for fall due to : - (No Data) History of fall(s);Impaired balance/gait;Impaired mobility Impaired balance/gait -  Risk for fall due to: Comment - patient states she is clumsy - - -  Follow up Falls prevention discussed Falls prevention discussed Falls evaluation completed;Education provided;Falls prevention discussed Falls prevention discussed -   Depression Screen PHQ 2/9 Scores 07/04/2017 06/05/2017 04/15/2017 04/14/2017  PHQ - 2 Score 0 0 0 0     Cognitive Function- Pt declined today.         Immunization History  Administered Date(s) Administered  . Pneumococcal Conjugate-13 02/24/2014, 03/13/2017  . Pneumococcal Polysaccharide-23 01/26/2013  . Td 09/06/2015  . Zoster 10/23/2013   Screening Tests Health Maintenance  Topic Date Due  . INFLUENZA VACCINE  06/25/2017  . MAMMOGRAM  10/30/2017  . PNA vac Low Risk  Adult (2 of 2 - PPSV23) 03/13/2018  . COLONOSCOPY  07/26/2025  . TETANUS/TDAP  09/05/2025  . DEXA SCAN  Completed  . Hepatitis C Screening  Completed      Plan:  I have personally reviewed and addressed the Medicare Annual Wellness questionnaire and have noted the following in the patient's chart:  A. Medical and social history B. Use of alcohol, tobacco or illicit drugs  C. Current medications and supplements D. Functional ability and status E.  Nutritional status F.  Physical activity G. Advance directives H. List of other physicians I.  Hospitalizations, surgeries, and ER visits in previous 12 months J.  Vitals K. Screenings such as hearing and vision if needed, cognitive and depression L. Referrals and appointments - none  In addition, I have reviewed and discussed with patient certain preventive protocols, quality metrics, and best practice recommendations. A written personalized care plan for preventive services as well as general preventive health recommendations were provided to patient.  See attached scanned questionnaire for additional information.   Signed,  Hyacinth MeekerMckenzie Carlon Chaloux, LPN Nurse Health Advisor   MD Recommendations: None.

## 2017-07-04 NOTE — Patient Instructions (Addendum)
[name] , Thank you for taking time to come for your Medicare Wellness Visit. I appreciate your ongoing commitment to your health goals. Please review the following plan we discussed and let me know if I can assist you in the future.   Screening recommendations/referrals: Colonoscopy: completed 07/27/15, due 07/2025 Mammogram: completed this year - per pt (requested copy) Bone Density: completed 12/16/2016 Recommended yearly ophthalmology/optometry visit for glaucoma screening and checkup Recommended yearly dental visit for hygiene and checkup  Vaccinations: Influenza vaccine: fall 2018 Pneumococcal vaccine: completed series Tdap vaccine: completed 09/06/15, due 08/2025 Shingles vaccine: completed 10/23/13  Advanced directives: Please bring a copy of your POA (Power [place] and/or Living Will to your next appointment.   Conditions/risks identified: Fall risk prevention; Smoking cessation; Recommend increasing water intake to 3 glasses a day.   Next appointment: None, need to schedule 1 year AWV   Preventive Care 3865 Years and Older, Female Preventive care refers to lifestyle choices and visits with your health care provider that can promote health and wellness. What does preventive care include?  A yearly physical exam. This is also called an annual well check.  Dental exams once or twice a year.  Routine eye exams. Ask your health care provider how often you should have your eyes checked.  Personal lifestyle choices, including:  Daily care of your teeth and gums.  Regular physical activity.  Eating a healthy diet.  Avoiding tobacco and drug use.  Limiting alcohol use.  Practicing safe sex.  Taking low-dose aspirin every day.  Taking vitamin and mineral supplements as recommended by your health care provider. What happens during an annual well check? The services and screenings done by your health care provider during your annual well check will depend on your  age, overall health, lifestyle risk factors, and family history of disease. Counseling  Your health care provider may ask you questions about your:  Alcohol use.  Tobacco use.  Drug use.  Emotional well-being.  Home and relationship well-being.  Sexual activity.  Eating habits.  History of falls.  Memory and ability to understand (cognition).  Work and work Astronomerenvironment.  Reproductive health. Screening  You may have the following tests or measurements:  Height, weight, and BMI.  Blood pressure.  Lipid and cholesterol levels. These may be checked every 5 years, or more frequently if you are over 394 years old.  Skin check.  Lung cancer screening. You may have this screening every year starting at age 68 if you have a 30-pack-year history of smoking and currently smoke or have quit within the past 15 years.  Fecal occult blood test (FOBT) of the stool. You may have this test every year starting at age 68.  Flexible sigmoidoscopy or colonoscopy. You may have a sigmoidoscopy every 5 years or a colonoscopy every 10 years starting at age 68.  Hepatitis C blood test.  Hepatitis B blood test.  Sexually transmitted disease (STD) testing.  Diabetes screening. This is done by checking your blood sugar (glucose) after you have not eaten for a while (fasting). You may have this done every 1-3 years.  Bone density scan. This is done to screen for osteoporosis. You may have this done starting at age 68.  Mammogram. This may be done every 1-2 years. Talk to your health care provider about how often you should have regular mammograms. Talk with your health care provider about your test results, treatment options, and if necessary, the need for more tests. Vaccines  Your  health care provider may recommend certain vaccines, such as:  Influenza vaccine. This is recommended every year.  Tetanus, diphtheria, and acellular pertussis (Tdap, Td) vaccine. You may need a Td booster  every 10 years.  Zoster vaccine. You may need this after age 40.  Pneumococcal 13-valent conjugate (PCV13) vaccine. One dose is recommended after age 13.  Pneumococcal polysaccharide (PPSV23) vaccine. One dose is recommended after age 36. Talk to your health care provider about which screenings and vaccines you need and how often you need them. This information is not intended to replace advice given to you by your health care provider. Make sure you discuss any questions you have with your health care provider. Document Released: 12/08/2015 Document Revised: 07/31/2016 Document Reviewed: 09/12/2015 Elsevier Interactive Patient Education  2017 [place] Prevention in the Home Falls can cause injuries. They can happen to people of all ages. There are many things you can do to make your home safe and to help prevent falls. What can I do on the outside of my home?  Regularly fix the edges of walkways and driveways and fix any cracks.  Remove anything that might make you trip as you walk through a door, such as a raised step or threshold.  Trim any bushes or trees on the path to your home.  Use bright outdoor lighting.  Clear any walking paths of anything that might make someone trip, such as rocks or tools.  Regularly check to see if handrails are loose or broken. Make sure that both sides of any steps have handrails.  Any raised decks and porches should have guardrails on the edges.  Have any leaves, snow, or ice cleared regularly.  Use sand or salt on walking paths during winter.  Clean up any spills in your garage right away. This includes oil or grease spills. What can I do in the bathroom?  Use night lights.  Install grab bars by the toilet and in the tub and shower. Do not use towel bars as grab bars.  Use non-skid mats or decals in the tub or shower.  If you need to sit down in the shower, use a plastic, non-slip stool.  Keep the floor dry. Clean up any  water that spills on the floor as soon as it happens.  Remove soap buildup in the tub or shower regularly.  Attach bath mats securely with double-sided non-slip rug tape.  Do not have throw rugs and other things on the floor that can make you trip. What can I do in the bedroom?  Use night lights.  Make sure that you have a light by your bed that is easy to reach.  Do not use any sheets or blankets that are too big for your bed. They should not hang down onto the floor.  Have a firm chair that has side arms. You can use this for support while you get dressed.  Do not have throw rugs and other things on the floor that can make you trip. What can I do in the kitchen?  Clean up any spills right away.  Avoid walking on wet floors.  Keep items that you use a lot in easy-to-reach places.  If you need to reach something above you, use a strong step stool that has a grab bar.  Keep electrical cords out of the way.  Do not use floor polish or wax that makes floors slippery. If you must use wax, use non-skid floor wax.  Do not  have throw rugs and other things on the floor that can make you trip. What can I do with my stairs?  Do not leave any items on the stairs.  Make sure that there are handrails on both sides of the stairs and use them. Fix handrails that are broken or loose. Make sure that handrails are as long as the stairways.  Check any carpeting to make sure that it is firmly attached to the stairs. Fix any carpet that is loose or worn.  Avoid having throw rugs at the top or bottom of the stairs. If you do have throw rugs, attach them to the floor with carpet tape.  Make sure that you have a light switch at the top of the stairs and the bottom of the stairs. If you do not have them, ask someone to add them for you. What else can I do to help prevent falls?  Wear shoes that:  Do not have high heels.  Have rubber bottoms.  Are comfortable and fit you well.  Are closed  at the toe. Do not wear sandals.  If you use a stepladder:  Make sure that it is fully opened. Do not climb a closed stepladder.  Make sure that both sides of the stepladder are locked into place.  Ask someone to hold it for you, if possible.  Clearly mark and make sure that you can see:  Any grab bars or handrails.  First and last steps.  Where the edge of each step is.  Use tools that help you move around (mobility aids) if they are needed. These include:  Canes.  Walkers.  Scooters.  Crutches.  Turn on the lights when you go into a dark area. Replace any light bulbs as soon as they burn out.  Set up your furniture so you have a clear path. Avoid moving your furniture around.  If any of your floors are uneven, fix them.  If there are any pets around you, be aware of where they are.  Review your medicines with your doctor. Some medicines can make you feel dizzy. This can increase your chance of falling. Ask your doctor what other things that you can do to help prevent falls. This information is not intended to replace advice given to you by your health care provider. Make sure you discuss any questions you have with your health care provider. Document Released: 09/07/2009 Document Revised: 04/18/2016 Document Reviewed: 12/16/2014 Elsevier Interactive Patient Education  2017 [employer].

## 2017-07-05 LAB — HEPATITIS C ANTIBODY: Hep C Virus Ab: 0.1 s/co ratio (ref 0.0–0.9)

## 2017-07-07 DIAGNOSIS — I10 Essential (primary) hypertension: Secondary | ICD-10-CM | POA: Diagnosis not present

## 2017-07-07 DIAGNOSIS — G8929 Other chronic pain: Secondary | ICD-10-CM | POA: Diagnosis not present

## 2017-07-07 DIAGNOSIS — I2510 Atherosclerotic heart disease of native coronary artery without angina pectoris: Secondary | ICD-10-CM | POA: Diagnosis not present

## 2017-07-07 DIAGNOSIS — J9611 Chronic respiratory failure with hypoxia: Secondary | ICD-10-CM | POA: Diagnosis not present

## 2017-07-07 DIAGNOSIS — J189 Pneumonia, unspecified organism: Secondary | ICD-10-CM | POA: Diagnosis not present

## 2017-07-07 DIAGNOSIS — J440 Chronic obstructive pulmonary disease with acute lower respiratory infection: Secondary | ICD-10-CM | POA: Diagnosis not present

## 2017-07-07 DIAGNOSIS — J441 Chronic obstructive pulmonary disease with (acute) exacerbation: Secondary | ICD-10-CM | POA: Diagnosis not present

## 2017-07-07 DIAGNOSIS — I739 Peripheral vascular disease, unspecified: Secondary | ICD-10-CM | POA: Diagnosis not present

## 2017-07-07 DIAGNOSIS — M810 Age-related osteoporosis without current pathological fracture: Secondary | ICD-10-CM | POA: Diagnosis not present

## 2017-07-09 LAB — VITAMIN D 1,25 DIHYDROXY
VITAMIN D 1, 25 (OH) TOTAL: 27 pg/mL
Vitamin D3 1, 25 (OH)2: 24 pg/mL

## 2017-07-10 ENCOUNTER — Telehealth: Payer: Self-pay | Admitting: Family Medicine

## 2017-07-10 ENCOUNTER — Telehealth: Payer: Self-pay

## 2017-07-10 DIAGNOSIS — J9611 Chronic respiratory failure with hypoxia: Secondary | ICD-10-CM | POA: Diagnosis not present

## 2017-07-10 DIAGNOSIS — I10 Essential (primary) hypertension: Secondary | ICD-10-CM | POA: Diagnosis not present

## 2017-07-10 DIAGNOSIS — J441 Chronic obstructive pulmonary disease with (acute) exacerbation: Secondary | ICD-10-CM | POA: Diagnosis not present

## 2017-07-10 DIAGNOSIS — I739 Peripheral vascular disease, unspecified: Secondary | ICD-10-CM | POA: Diagnosis not present

## 2017-07-10 DIAGNOSIS — G8929 Other chronic pain: Secondary | ICD-10-CM | POA: Diagnosis not present

## 2017-07-10 DIAGNOSIS — M810 Age-related osteoporosis without current pathological fracture: Secondary | ICD-10-CM | POA: Diagnosis not present

## 2017-07-10 DIAGNOSIS — J440 Chronic obstructive pulmonary disease with acute lower respiratory infection: Secondary | ICD-10-CM | POA: Diagnosis not present

## 2017-07-10 DIAGNOSIS — I2510 Atherosclerotic heart disease of native coronary artery without angina pectoris: Secondary | ICD-10-CM | POA: Diagnosis not present

## 2017-07-10 DIAGNOSIS — J189 Pneumonia, unspecified organism: Secondary | ICD-10-CM | POA: Diagnosis not present

## 2017-07-10 NOTE — Telephone Encounter (Signed)
Cheryl [name] with [facility] called wanting to report her falling yesterday in the bathroom x's 2  No injuries also she needs orders for every other week for 9 weeks for homehealth care.  Thanks Fortune Brandsteri

## 2017-07-10 NOTE — Telephone Encounter (Signed)
Please advise orders? 

## 2017-07-10 NOTE — Telephone Encounter (Signed)
LMTCB and r/s AWV with [facility], that was cancelled 04/25/17.

## 2017-07-11 ENCOUNTER — Telehealth: Payer: Self-pay | Admitting: Family Medicine

## 2017-07-11 DIAGNOSIS — I2510 Atherosclerotic heart disease of native coronary artery without angina pectoris: Secondary | ICD-10-CM | POA: Diagnosis not present

## 2017-07-11 DIAGNOSIS — I739 Peripheral vascular disease, unspecified: Secondary | ICD-10-CM | POA: Diagnosis not present

## 2017-07-11 DIAGNOSIS — M810 Age-related osteoporosis without current pathological fracture: Secondary | ICD-10-CM | POA: Diagnosis not present

## 2017-07-11 DIAGNOSIS — J440 Chronic obstructive pulmonary disease with acute lower respiratory infection: Secondary | ICD-10-CM | POA: Diagnosis not present

## 2017-07-11 DIAGNOSIS — G8929 Other chronic pain: Secondary | ICD-10-CM | POA: Diagnosis not present

## 2017-07-11 DIAGNOSIS — J189 Pneumonia, unspecified organism: Secondary | ICD-10-CM | POA: Diagnosis not present

## 2017-07-11 DIAGNOSIS — I10 Essential (primary) hypertension: Secondary | ICD-10-CM | POA: Diagnosis not present

## 2017-07-11 DIAGNOSIS — J9611 Chronic respiratory failure with hypoxia: Secondary | ICD-10-CM | POA: Diagnosis not present

## 2017-07-11 DIAGNOSIS — J441 Chronic obstructive pulmonary disease with (acute) exacerbation: Secondary | ICD-10-CM | POA: Diagnosis not present

## 2017-07-11 NOTE — Telephone Encounter (Signed)
Pt states she had her AWE 07/04/17/MW

## 2017-07-11 NOTE — Telephone Encounter (Signed)
Please advise 

## 2017-07-11 NOTE — Telephone Encounter (Signed)
Thayer Ohm with [facility] needs verbal ok for PT and homecare continued.  [phone]  Thank sTeri

## 2017-07-14 ENCOUNTER — Ambulatory Visit: Payer: Medicare HMO | Admitting: Pain Medicine

## 2017-07-14 DIAGNOSIS — J449 Chronic obstructive pulmonary disease, unspecified: Secondary | ICD-10-CM | POA: Diagnosis not present

## 2017-07-14 NOTE — Telephone Encounter (Signed)
Left message for Thayer Ohm with [facility] to return call.

## 2017-07-14 NOTE — Telephone Encounter (Signed)
Cheryl [name] (who is working for [employer] today) as below.

## 2017-07-14 NOTE — Telephone Encounter (Signed)
Ok to give verbal 

## 2017-07-15 DIAGNOSIS — J9611 Chronic respiratory failure with hypoxia: Secondary | ICD-10-CM | POA: Diagnosis not present

## 2017-07-15 DIAGNOSIS — J441 Chronic obstructive pulmonary disease with (acute) exacerbation: Secondary | ICD-10-CM | POA: Diagnosis not present

## 2017-07-15 DIAGNOSIS — K219 Gastro-esophageal reflux disease without esophagitis: Secondary | ICD-10-CM | POA: Diagnosis not present

## 2017-07-15 DIAGNOSIS — G8929 Other chronic pain: Secondary | ICD-10-CM | POA: Diagnosis not present

## 2017-07-15 DIAGNOSIS — G629 Polyneuropathy, unspecified: Secondary | ICD-10-CM | POA: Diagnosis not present

## 2017-07-15 DIAGNOSIS — M810 Age-related osteoporosis without current pathological fracture: Secondary | ICD-10-CM | POA: Diagnosis not present

## 2017-07-15 DIAGNOSIS — I739 Peripheral vascular disease, unspecified: Secondary | ICD-10-CM | POA: Diagnosis not present

## 2017-07-15 DIAGNOSIS — I10 Essential (primary) hypertension: Secondary | ICD-10-CM | POA: Diagnosis not present

## 2017-07-15 DIAGNOSIS — I2510 Atherosclerotic heart disease of native coronary artery without angina pectoris: Secondary | ICD-10-CM | POA: Diagnosis not present

## 2017-07-16 ENCOUNTER — Ambulatory Visit: Payer: Medicare HMO | Admitting: Pain Medicine

## 2017-07-16 DIAGNOSIS — J449 Chronic obstructive pulmonary disease, unspecified: Secondary | ICD-10-CM | POA: Diagnosis not present

## 2017-07-17 DIAGNOSIS — M810 Age-related osteoporosis without current pathological fracture: Secondary | ICD-10-CM | POA: Diagnosis not present

## 2017-07-17 DIAGNOSIS — K219 Gastro-esophageal reflux disease without esophagitis: Secondary | ICD-10-CM | POA: Diagnosis not present

## 2017-07-17 DIAGNOSIS — I739 Peripheral vascular disease, unspecified: Secondary | ICD-10-CM | POA: Diagnosis not present

## 2017-07-17 DIAGNOSIS — G629 Polyneuropathy, unspecified: Secondary | ICD-10-CM | POA: Diagnosis not present

## 2017-07-17 DIAGNOSIS — I10 Essential (primary) hypertension: Secondary | ICD-10-CM | POA: Diagnosis not present

## 2017-07-17 DIAGNOSIS — J441 Chronic obstructive pulmonary disease with (acute) exacerbation: Secondary | ICD-10-CM | POA: Diagnosis not present

## 2017-07-17 DIAGNOSIS — G8929 Other chronic pain: Secondary | ICD-10-CM | POA: Diagnosis not present

## 2017-07-17 DIAGNOSIS — I2510 Atherosclerotic heart disease of native coronary artery without angina pectoris: Secondary | ICD-10-CM | POA: Diagnosis not present

## 2017-07-17 DIAGNOSIS — J9611 Chronic respiratory failure with hypoxia: Secondary | ICD-10-CM | POA: Diagnosis not present

## 2017-07-21 ENCOUNTER — Ambulatory Visit: Payer: Medicare HMO | Attending: Pain Medicine | Admitting: Pain Medicine

## 2017-07-21 ENCOUNTER — Encounter: Payer: Self-pay | Admitting: Pain Medicine

## 2017-07-21 VITALS — BP 145/78 | HR 76 | Temp 98.4°F | Resp 16

## 2017-07-21 DIAGNOSIS — M5416 Radiculopathy, lumbar region: Secondary | ICD-10-CM | POA: Insufficient documentation

## 2017-07-21 DIAGNOSIS — Z7901 Long term (current) use of anticoagulants: Secondary | ICD-10-CM | POA: Insufficient documentation

## 2017-07-21 DIAGNOSIS — M47816 Spondylosis without myelopathy or radiculopathy, lumbar region: Secondary | ICD-10-CM | POA: Diagnosis not present

## 2017-07-21 DIAGNOSIS — I110 Hypertensive heart disease with heart failure: Secondary | ICD-10-CM | POA: Diagnosis not present

## 2017-07-21 DIAGNOSIS — Z885 Allergy status to narcotic agent status: Secondary | ICD-10-CM | POA: Diagnosis not present

## 2017-07-21 DIAGNOSIS — M7989 Other specified soft tissue disorders: Secondary | ICD-10-CM | POA: Insufficient documentation

## 2017-07-21 DIAGNOSIS — F1190 Opioid use, unspecified, uncomplicated: Secondary | ICD-10-CM

## 2017-07-21 DIAGNOSIS — G629 Polyneuropathy, unspecified: Secondary | ICD-10-CM | POA: Diagnosis not present

## 2017-07-21 DIAGNOSIS — K219 Gastro-esophageal reflux disease without esophagitis: Secondary | ICD-10-CM | POA: Diagnosis not present

## 2017-07-21 DIAGNOSIS — Z7902 Long term (current) use of antithrombotics/antiplatelets: Secondary | ICD-10-CM | POA: Insufficient documentation

## 2017-07-21 DIAGNOSIS — M797 Fibromyalgia: Secondary | ICD-10-CM | POA: Insufficient documentation

## 2017-07-21 DIAGNOSIS — G894 Chronic pain syndrome: Secondary | ICD-10-CM | POA: Diagnosis not present

## 2017-07-21 DIAGNOSIS — R112 Nausea with vomiting, unspecified: Secondary | ICD-10-CM | POA: Diagnosis not present

## 2017-07-21 DIAGNOSIS — M4696 Unspecified inflammatory spondylopathy, lumbar region: Secondary | ICD-10-CM

## 2017-07-21 DIAGNOSIS — G8929 Other chronic pain: Secondary | ICD-10-CM | POA: Diagnosis not present

## 2017-07-21 DIAGNOSIS — I2510 Atherosclerotic heart disease of native coronary artery without angina pectoris: Secondary | ICD-10-CM | POA: Diagnosis not present

## 2017-07-21 DIAGNOSIS — J9611 Chronic respiratory failure with hypoxia: Secondary | ICD-10-CM | POA: Diagnosis not present

## 2017-07-21 DIAGNOSIS — I10 Essential (primary) hypertension: Secondary | ICD-10-CM | POA: Diagnosis not present

## 2017-07-21 DIAGNOSIS — J449 Chronic obstructive pulmonary disease, unspecified: Secondary | ICD-10-CM | POA: Insufficient documentation

## 2017-07-21 DIAGNOSIS — Z79891 Long term (current) use of opiate analgesic: Secondary | ICD-10-CM | POA: Insufficient documentation

## 2017-07-21 DIAGNOSIS — E876 Hypokalemia: Secondary | ICD-10-CM | POA: Insufficient documentation

## 2017-07-21 DIAGNOSIS — Z86718 Personal history of other venous thrombosis and embolism: Secondary | ICD-10-CM | POA: Insufficient documentation

## 2017-07-21 DIAGNOSIS — I739 Peripheral vascular disease, unspecified: Secondary | ICD-10-CM | POA: Diagnosis not present

## 2017-07-21 DIAGNOSIS — I5031 Acute diastolic (congestive) heart failure: Secondary | ICD-10-CM | POA: Diagnosis not present

## 2017-07-21 DIAGNOSIS — M5441 Lumbago with sciatica, right side: Secondary | ICD-10-CM

## 2017-07-21 DIAGNOSIS — R1310 Dysphagia, unspecified: Secondary | ICD-10-CM | POA: Diagnosis not present

## 2017-07-21 DIAGNOSIS — F1290 Cannabis use, unspecified, uncomplicated: Secondary | ICD-10-CM | POA: Diagnosis not present

## 2017-07-21 DIAGNOSIS — M5442 Lumbago with sciatica, left side: Secondary | ICD-10-CM

## 2017-07-21 DIAGNOSIS — E785 Hyperlipidemia, unspecified: Secondary | ICD-10-CM | POA: Diagnosis not present

## 2017-07-21 DIAGNOSIS — M545 Low back pain: Secondary | ICD-10-CM | POA: Diagnosis not present

## 2017-07-21 DIAGNOSIS — D649 Anemia, unspecified: Secondary | ICD-10-CM | POA: Diagnosis not present

## 2017-07-21 DIAGNOSIS — M810 Age-related osteoporosis without current pathological fracture: Secondary | ICD-10-CM | POA: Diagnosis not present

## 2017-07-21 DIAGNOSIS — E43 Unspecified severe protein-calorie malnutrition: Secondary | ICD-10-CM | POA: Insufficient documentation

## 2017-07-21 DIAGNOSIS — J441 Chronic obstructive pulmonary disease with (acute) exacerbation: Secondary | ICD-10-CM | POA: Diagnosis not present

## 2017-07-21 MED ORDER — TRAMADOL HCL 50 MG PO TABS: 100.0000 mg | ORAL_TABLET | Freq: Four times a day (QID) | ORAL | 0 refills | Status: DC | PRN

## 2017-07-21 MED ORDER — FENTANYL 50 MCG/HR TD PT72: 50.0000 ug | MEDICATED_PATCH | TRANSDERMAL | 0 refills | Status: DC

## 2017-07-21 MED ORDER — TRAMADOL HCL 50 MG PO TABS: 100.0000 mg | ORAL_TABLET | Freq: Four times a day (QID) | ORAL | 2 refills | Status: DC | PRN

## 2017-07-21 NOTE — Patient Instructions (Addendum)
____________________________________________________________________________________________  Medication Rules  Applies to: All patients receiving prescriptions (written or electronic).  Pharmacy of record: Pharmacy where electronic prescriptions will be sent. If written prescriptions are taken to a different pharmacy, please inform the nursing staff. The pharmacy listed in the electronic medical record should be the one where you would like electronic prescriptions to be sent.  Prescription refills: Only during scheduled appointments. Applies to both, written and electronic prescriptions.  NOTE: The following applies primarily to controlled substances (Opioid* Pain Medications).   Patient's responsibilities: 1. Pain Pills: Bring all pain pills to every appointment (except for procedure appointments). 2. Pill Bottles: Bring pills in original pharmacy bottle. Always bring newest bottle. Bring bottle, even if empty. 3. Medication refills: You are responsible for knowing and keeping track of what medications you need refilled. The day before your appointment, write a list of all prescriptions that need to be refilled. Bring that list to your appointment and give it to the admitting nurse. Prescriptions will be written only during appointments. If you forget a medication, it will not be "Called in", "Faxed", or "electronically sent". You will need to get another appointment to get these prescribed. 4. Prescription Accuracy: You are responsible for carefully inspecting your prescriptions before leaving our office. Have the discharge nurse carefully go over each prescription with you, before taking them home. Make sure that your name is accurately spelled, that your address is correct. Check the name and dose of your medication to make sure it is accurate. Check the number of pills, and the written instructions to make sure they are clear and accurate. Make sure that you are given enough medication to  last until your next medication refill appointment. 5. Taking Medication: Take medication as prescribed. Never take more pills than instructed. Never take medication more frequently than prescribed. Taking less pills or less frequently is permitted and encouraged, when it comes to controlled substances (written prescriptions).  6. Inform other Doctors: Always inform, all of your healthcare providers, of all the medications you take. 7. Pain Medication from other Providers: You are not allowed to accept any additional pain medication from any other Doctor or Healthcare provider. There are two exceptions to this rule. (see below) In the event that you require additional pain medication, you are responsible for notifying us, as stated below. 8. Medication Agreement: You are responsible for carefully reading and following our Medication Agreement. This must be signed before receiving any prescriptions from our practice. Safely store a copy of your signed Agreement. Violations to the Agreement will result in no further prescriptions. (Additional copies of our Medication Agreement are available upon request.) 9. Laws, Rules, & Regulations: All patients are expected to follow all Federal and State Laws, Statutes, Rules, & Regulations. Ignorance of the Laws does not constitute a valid excuse. The use of any illegal substances is prohibited. 10. Adopted CDC guidelines & recommendations: Target dosing levels will be at or below 60 MME/day. Use of benzodiazepines** is not recommended.  Exceptions: There are only two exceptions to the rule of not receiving pain medications from other Healthcare Providers. 1. Exception #1 (Emergencies): In the event of an emergency (i.e.: accident requiring emergency care), you are allowed to receive additional pain medication. However, you are responsible for: As soon as you are able, call our office [phone], at any time of the day or night, and leave a message stating your  name, the date and nature of the emergency, and the name and dose of the medication   prescribed. In the event that your call is answered by a member of our staff, make sure to document and save the date, time, and the name of the person that took your information.  2. Exception #2 (Planned Surgery): In the event that you are scheduled by another doctor or dentist to have any type of surgery or procedure, you are allowed (for a period no longer than 30 days), to receive additional pain medication, for the acute post-op pain. However, in this case, you are responsible for picking up a copy of our "Post-op Pain Management for Surgeons" handout, and giving it to your surgeon or dentist. This document is available at our office, and does not require an appointment to obtain it. Simply go to our office during business hours (Monday-Thursday from 8:00 AM to 4:00 PM) (Friday 8:00 AM to 12:00 Noon) or if you have a scheduled appointment with [place], prior to your surgery, and ask for it by name. In addition, you will need to provide [place] with your name, name of your surgeon, type of surgery, and date of procedure or surgery.  *Opioid medications include: morphine, codeine, oxycodone, oxymorphone, hydrocodone, hydromorphone, meperidine, tramadol, tapentadol, buprenorphine, fentanyl, methadone. **Benzodiazepine medications include: diazepam (Valium), alprazolam (Xanax), clonazepam (Klonopine), lorazepam (Ativan), clorazepate (Tranxene), chlordiazepoxide (Librium), estazolam (Prosom), oxazepam (Serax), temazepam (Restoril), triazolam (Halcion)  ____________________________________________________________________________________________ ____________________________________________________________________________________________  Preparing for Procedure with Sedation Instructions: . Oral Intake: Do not eat or drink anything for at least 8 hours prior to your procedure. . Transportation: Public transportation is not allowed.  Bring an adult driver. The driver must be physically present in our waiting room before any procedure can be started. [facility] Kitchen Physical Assistance: Bring an adult physically capable of assisting you, in the event you need help. This adult should keep you company at home for at least 6 hours after the procedure. . Blood Pressure Medicine: Take your blood pressure medicine with a sip of water the morning of the procedure. . Blood thinners:  . Diabetics on insulin: Notify the staff so that you can be scheduled 1st case in the morning. If your diabetes requires high dose insulin, take only  of your normal insulin dose the morning of the procedure and notify the staff that you have done so. . Preventing infections: Shower with an antibacterial soap the morning of your procedure. . Build-up your immune system: Take 1000 mg of Vitamin C with every meal (3 times a day) the day prior to your procedure. Marland Kitchen Antibiotics: Inform the staff if you have a condition or reason that requires you to take antibiotics before dental procedures. . Pregnancy: If you are pregnant, call and cancel the procedure. . Sickness: If you have a cold, fever, or any active infections, call and cancel the procedure. . Arrival: You must be in the facility at least 30 minutes prior to your scheduled procedure. . Children: Do not bring children with you. . Dress appropriately: Bring dark clothing that you would not mind if they get stained. . Valuables: Do not bring any jewelry or valuables. Procedure appointments are reserved for interventional treatments only. Marland Kitchen No Prescription Refills. . No medication changes will be discussed during procedure appointments. . No disability issues will be discussed. ____________________________________________________________________________________________

## 2017-07-21 NOTE — Progress Notes (Signed)
Nursing Pain Medication Assessment:  Safety precautions to be maintained throughout the outpatient stay will include: orient to surroundings, keep bed in low position, maintain call bell within reach at all times, provide assistance with transfer out of bed and ambulation.  Medication Inspection Compliance: Ms. Void did not comply with our request to bring her pills to be counted. She was reminded that bringing the medication bottles, even when empty, is a requirement.  Medication: None brought in. Pill/Patch Count: None available to be counted. Bottle Appearance: No container available. Did not bring bottle(s) to appointment. Filled Date: N/A Last Medication intake:  Today

## 2017-07-21 NOTE — Progress Notes (Signed)
Patient's Name: Cheryl [name]  MRN: [id number]  Referring Provider: Birdie Sons, MD  DOB: 25-Feb-1951  PCP: Birdie Sons, MD  DOS: 07/21/2017  Note by: Gaspar Cola, MD  Service setting: Ambulatory outpatient  Specialty: Interventional Pain Management  Location: [facility] (AMB) Pain Management Facility    Patient type: Established   Primary Reason(s) for Visit: Encounter for prescription drug management. (Level of risk: moderate)  CC: Back Pain (lower, right)  HPI  Cheryl [name] is a 66 y.o. year old, female patient, who comes today for a medication management evaluation. She has Essential hypertension; GERD (gastroesophageal reflux disease); Hyperlipemia; Anxiety; Airway hyperreactivity; Back pain, thoracic; Coronary artery disease; Excessive falling; Alteration in bowel elimination: incontinence; Insomnia; Decreased testosterone level; Leg weakness; Menopausal symptom; Migraine; Neuropathy ([place]); Fecal occult blood test positive; OP (osteoporosis); Panic disorder; Compulsive tobacco user syndrome; Urinary incontinence; Vitamin D deficiency; Weight loss; COPD (chronic obstructive pulmonary disease) ([place]); Dementia; Lumbar radicular pain (Bilateral) (L>R) (L4); Chronic low back pain (Location of Primary Source of Pain) (Bilateral) (L>R); Encounter for therapeutic drug level monitoring; Uncomplicated opioid dependence ([place]); Chronic pain syndrome; Peripheral nerve disease; Peripheral vascular disease ([place]); Platelet inhibition due to Plavix; Abnormal mammogram of right breast; Dilated intrahepatic bile duct; Dysphagia; Acute diastolic CHF (congestive heart failure) ([place]); Leukocytosis; Long term current use of opiate analgesic; Fibromyalgia; Osteoarthrosis; Long term current use of anticoagulant therapy (Plavix); Opiate use (160 MME/Day); Anemia; Lumbar facet syndrome (Location of Primary Source of Pain) (Bilateral) (L>R); Lumbar spondylosis; Nausea with vomiting; Protein-calorie malnutrition,  severe; Metabolic encephalopathy; Hypokalemia; Pneumonia; and Marijuana use on her problem list. Her primarily concern today is the Back Pain (lower, right)  Pain Assessment: Location: Lower, Right Back Radiating: n/a Onset: More than a month ago Duration: Chronic pain Quality: Aching Severity: 3 /10 (self-reported pain score)  Note: Reported level is compatible with observation.                   Effect on ADL: sits most of the time, frequent rest Timing: Constant Modifying factors: nothing  Cheryl [name] was last scheduled for an appointment on 06/03/2017 for medication management. During today's appointment we reviewed Cheryl [name]'s chronic pain status, as well as her outpatient medication regimen.  The patient  reports that she does not use drugs. Her body mass index is unknown because there is no height or weight on file.  Further details on both, my assessment(s), as well as the proposed treatment plan, please see below.  Controlled Substance Pharmacotherapy Assessment REMS (Risk Evaluation and Mitigation Strategy)  Analgesic:Duragesic 50 mcg/h every 72 hours + Tramadol 100 mg PO q6hrs (400 mg/day) MME/day:160 mg/day.   Landis Martins, RN  07/21/2017 11:14 AM  Sign at close encounter Nursing Pain Medication Assessment:  Safety precautions to be maintained throughout the outpatient stay will include: orient to surroundings, keep bed in low position, maintain call bell within reach at all times, provide assistance with transfer out of bed and ambulation.  Medication Inspection Compliance: Cheryl [name] did not comply with our request to bring her pills to be counted. She was reminded that bringing the medication bottles, even when empty, is a requirement.  Medication: None brought in. Pill/Patch Count: None available to be counted. Bottle Appearance: No container available. Did not bring bottle(s) to appointment. Filled Date: N/A Last Medication intake:  Today    Pharmacokinetics: Liberation and absorption (onset of action): WNL Distribution (time to peak effect): WNL Metabolism and excretion (duration of action): WNL  Pharmacodynamics: Desired effects: Analgesia: Cheryl [name] reports >50% benefit. Functional ability: Patient reports that medication allows her to accomplish basic ADLs Clinically meaningful improvement in function (CMIF): Sustained CMIF goals met Perceived effectiveness: Described as relatively effective, allowing for increase in activities of daily living (ADL) Undesirable effects: Side-effects or Adverse reactions: None reported Monitoring: [place] PMP: Online review of the past 35-monthperiod conducted. Compliant with practice rules and regulations List of all UDS test(s) done:  Lab Results  Component Value Date   TOXASSSELUR FINAL 12/05/2016   [place]AL 03/20/2016   [place] 02/28/2016   [place] 12/04/2015   SUMMARY FINAL 06/05/2017   Last UDS on record: [employer]ox[employer] Select 13  Date Value Ref Range Status  12/05/2016 FINAL  Final    Comment:    ==================================================================== TOXASSURE SELECT 13 (MW) ==================================================================== Test                             Result       Flag       Units Drug Present and Declared for Prescription Verification   Fentanyl                       34           EXPECTED   ng/mg creat   Norfentanyl                    258          EXPECTED   ng/mg creat    Source of fentanyl is a scheduled prescription medication,    including IV, patch, and transmucosal formulations. Norfentanyl    is an expected metabolite of fentanyl.   Tramadol                       PRESENT      EXPECTED   O-Desmethyltramadol            PRESENT      EXPECTED   N-Desmethyltramadol            PRESENT      EXPECTED    Source of tramadol is a prescription medication.    O-desmethyltramadol and N-desmethyltramadol are  expected    metabolites of tramadol. Drug Absent but Declared for Prescription Verification   Alprazolam                     Not Detected UNEXPECTED ng/mg creat ==================================================================== Test                      Result    Flag   Units      Ref Range   Creatinine              197              mg/dL      >=20 ==================================================================== Declared Medications:  The flagging and interpretation on this report are based on the  following declared medications.  Unexpected results may arise from  inaccuracies in the declared medications.  **Note: The testing scope of this panel includes these medications:  Alprazolam (Xanax)  Fentanyl (Duragesic)  Tramadol (Ultram)  **Note: The testing scope of this panel does not include following  reported medications:  Acetaminophen  Albuterol  Albuterol (Proventil)  Atropine (Lomotil)  Celecoxib (Celebrex)  Clopidogrel (Plavix)  Cyclobenzaprine (Flexeril)  Diphenoxylate (Lomotil)  Donepezil (  Aricept)  Estropipate  Fluticasone (Advair)  Fluticasone (Flonase)  Furosemide (Lasix)  Mupirocin (Bactroban)  Pantoprazole  Potassium  Pregabalin (Lyrica)  Salmeterol (Advair)  Simvastatin (Zocor)  Sumatriptan (Imitrex)  Tiotropium (Spiriva)  Triamcinolone (Kenalog) ==================================================================== For clinical consultation, please call [phone]. ====================================================================    Summary  Date Value Ref Range Status  06/05/2017 FINAL  Final    Comment:    ==================================================================== TOXASSURE SELECT 13 (MW) ==================================================================== Test                             Result       Flag       Units Drug Present and Declared for Prescription Verification   Fentanyl                       33            EXPECTED   ng/mg creat   Norfentanyl                    321          EXPECTED   ng/mg creat    Source of fentanyl is a scheduled prescription medication,    including IV, patch, and transmucosal formulations. Norfentanyl    is an expected metabolite of fentanyl.   Tramadol                       PRESENT      EXPECTED   O-Desmethyltramadol            PRESENT      EXPECTED   N-Desmethyltramadol            PRESENT      EXPECTED    Source of tramadol is a prescription medication.    O-desmethyltramadol and N-desmethyltramadol are expected    metabolites of tramadol. Drug Absent but Declared for Prescription Verification   Alprazolam                     Not Detected UNEXPECTED ng/mg creat ==================================================================== Test                      Result    Flag   Units      Ref Range   Creatinine              33               mg/dL      >=20 ==================================================================== Declared Medications:  The flagging and interpretation on this report are based on the  following declared medications.  Unexpected results may arise from  inaccuracies in the declared medications.  **Note: The testing scope of this panel includes these medications:  Alprazolam (Xanax)  Fentanyl  Tramadol  **Note: The testing scope of this panel does not include following  reported medications:  Acetaminophen (Tylenol)  Albuterol  Atropine (Lomotil)  Celecoxib (Celebrex)  Clopidogrel (Plavix)  Diphenoxylate (Lomotil)  Donepezil (Aricept)  Estropipate ([place])  Fluticasone (Advair)  Mupirocin  Pantoprazole (Protonix)  Pregabalin (Lyrica)  Salmeterol (Advair)  Simvastatin (Zocor)  Sumatriptan (Imitrex)  Tiotropium (Spiriva)  Triamcinolone (Kenalog) ==================================================================== For clinical consultation, please call [phone]. ====================================================================     UDS interpretation: Compliant          Medication Assessment Form: Reviewed. Patient indicates being compliant with therapy Treatment compliance: Compliant Risk Assessment Profile: Aberrant behavior: See  prior evaluations. None observed or detected today Comorbid factors increasing risk of overdose: See prior notes. No additional risks detected today Risk of substance use disorder (SUD): Low     Opioid Risk Tool - 07/21/17 1110      Family History of Substance Abuse   Alcohol Positive Female   Illegal Drugs Positive Female   Rx Drugs Positive Female or Female     Personal History of Substance Abuse   Alcohol Negative   Illegal Drugs Negative   Rx Drugs Negative     Age   Age between 47-45 years  No     History of Preadolescent Sexual Abuse   History of Preadolescent Sexual Abuse Negative or Female     Psychological Disease   Psychological Disease Negative   Depression Negative     Total Score   Opioid Risk Tool Scoring 7   Opioid Risk Interpretation Moderate Risk     ORT Scoring interpretation table:  Score <3 = Low Risk for SUD  Score between 4-7 = Moderate Risk for SUD  Score >8 = High Risk for Opioid Abuse   Risk Mitigation Strategies:  Patient Counseling: Covered Patient-Prescriber Agreement (PPA): Present and active  Notification to other healthcare providers: Done  Pharmacologic Plan: No change in therapy, at this time  Laboratory Chemistry  Inflammation Markers (CRP: Acute Phase) (ESR: Chronic Phase) Lab Results  Component Value Date   CRP 0.6 03/01/2016   ESRSEDRATE 52 (H) 03/01/2016                 Renal Function Markers Lab Results  Component Value Date   BUN 18 05/14/2017   CREATININE 0.96 05/14/2017   GFRAA >60 05/14/2017   GFRNONAA 59 (L) 05/14/2017                 Hepatic Function Markers Lab Results  Component Value Date   AST 49 (H) 05/14/2017   ALT 30 05/14/2017   ALBUMIN 4.1 05/14/2017   ALKPHOS 146 (H) 05/14/2017                  Electrolytes Lab Results  Component Value Date   NA 132 (L) 05/14/2017   K 3.4 (L) 05/14/2017   CL 92 (L) 05/14/2017   CALCIUM 8.4 (L) 05/14/2017   MG 1.9 04/29/2017                 Neuropathy Markers Lab Results  Component Value Date   VITAMINB12 242 03/01/2016                 Bone Pathology Markers Lab Results  Component Value Date   ALKPHOS 146 (H) 05/14/2017   VD25OH 25.6 (L) 01/17/2016   VD125OH2TOT 27 07/04/2017   [id number] 24 07/04/2017   [id number] <10 07/04/2017   CALCIUM 8.4 (L) 05/14/2017                 Coagulation Parameters Lab Results  Component Value Date   INR 0.97 11/05/2016   LABPROT 12.9 11/05/2016   APTT 38 (H) 10/17/2015   PLT 329 05/14/2017                 Cardiovascular Markers Lab Results  Component Value Date   BNP 137.0 (H) 05/14/2017   HGB 9.1 (L) 05/14/2017   HCT 27.9 (L) 05/14/2017                 Note: Lab results reviewed.  Recent Diagnostic Imaging Review  Dg Chest  2 View Result Date: 05/14/2017 CLINICAL DATA:  Bilateral lower extremity swelling and pain which began yesterday. History of asthma -COPD, current smoker, coronary artery disease. EXAM: CHEST  2 VIEW COMPARISON:  Portable chest x-ray of April 29, 2017 and PA and lateral chest x-ray of January 28, 2017. FINDINGS: The lungs are well-expanded. The interstitial markings are increased bilaterally. There is lingular atelectasis or pneumonia. The cardiac silhouette is enlarged. The pulmonary vascularity is mildly prominent. There is calcification in the wall of the aortic arch. There is a moderate-sized hiatal hernia. There is no pleural effusion. There is prominent thoracic kyphosis. IMPRESSION: COPD. Lingular consolidation compatible with pneumonia. Cardiomegaly with interstitial edema bilaterally consistent with mild cardiac decompensation. Thoracic aortic atherosclerosis. Electronically Signed   By: David  Martinique M.D.   On: 05/14/2017 14:13   US Venous Img Lower  Bilateral Result Date: 05/14/2017 CLINICAL DATA:  Bilateral leg swelling, pain EXAM: BILATERAL LOWER EXTREMITY VENOUS DOPPLER ULTRASOUND TECHNIQUE: Gray-scale sonography with graded compression, as well as color Doppler and duplex ultrasound were performed to evaluate the lower extremity deep venous systems from the level of the common femoral vein and including the common femoral, femoral, profunda femoral, popliteal and calf veins including the posterior tibial, peroneal and gastrocnemius veins when visible. The superficial great saphenous vein was also interrogated. Spectral Doppler was utilized to evaluate flow at rest and with distal augmentation maneuvers in the common femoral, femoral and popliteal veins. COMPARISON:  08/20/2014 FINDINGS: RIGHT LOWER EXTREMITY Common Femoral Vein: No evidence of thrombus. Normal compressibility, respiratory phasicity and response to augmentation. Saphenofemoral Junction: No evidence of thrombus. Normal compressibility and flow on color Doppler imaging. Profunda Femoral Vein: No evidence of thrombus. Normal compressibility and flow on color Doppler imaging. Femoral Vein: No evidence of thrombus. Normal compressibility, respiratory phasicity and response to augmentation. Popliteal Vein: No evidence of thrombus. Normal compressibility, respiratory phasicity and response to augmentation. Calf Veins: No evidence of thrombus. Normal compressibility and flow on color Doppler imaging. Superficial Great Saphenous Vein: No evidence of thrombus. Normal compressibility and flow on color Doppler imaging. Venous Reflux:  None. Other Findings:  None. LEFT LOWER EXTREMITY Common Femoral Vein: No evidence of thrombus. Normal compressibility, respiratory phasicity and response to augmentation. Saphenofemoral Junction: No evidence of thrombus. Normal compressibility and flow on color Doppler imaging. Profunda Femoral Vein: No evidence of thrombus. Normal compressibility and flow on color  Doppler imaging. Femoral Vein: No evidence of thrombus. Normal compressibility, respiratory phasicity and response to augmentation. Popliteal Vein: No evidence of thrombus. Normal compressibility, respiratory phasicity and response to augmentation. Calf Veins: Limited visualization.  No definite thrombosis. Superficial Great Saphenous Vein: No evidence of thrombus. Normal compressibility and flow on color Doppler imaging. Venous Reflux:  None. Other Findings:  None. IMPRESSION: No evidence of DVT within either lower extremity. Electronically Signed   By: Rolm Baptise M.D.   On: 05/14/2017 13:29   Note: Imaging results reviewed.          Meds   Current Meds  Medication Sig  . acetaminophen (TYLENOL) 325 MG tablet Take 2 tablets (650 mg total) by mouth every 6 (six) hours as needed for mild pain (or Fever >/= 101).  . ADVAIR DISKUS 250-50 MCG/DOSE AEPB inhale 1 dose by mouth twice a day  . albuterol (PROVENTIL HFA;VENTOLIN HFA) 108 (90 BASE) MCG/ACT inhaler Inhale 2 puffs into the lungs every 4 (four) hours as needed for wheezing or shortness of breath.  Marland Kitchen albuterol (PROVENTIL) (2.5 MG/3ML) 0.083% nebulizer solution Take  3 mLs (2.5 mg total) by nebulization every 4 (four) hours as needed for wheezing.  Marland Kitchen alendronate (FOSAMAX) 70 MG tablet Take 1 tablet (70 mg total) by mouth every 7 (seven) days. Take with a full glass of water on an empty stomach.  . ALPRAZolam (XANAX) 0.5 MG tablet Take 1 tablet (0.5 mg total) by mouth 3 (three) times daily as needed for anxiety.  . celecoxib (CELEBREX) 100 MG capsule Take 1 capsule (100 mg total) by mouth 2 (two) times daily as needed.  . clopidogrel (PLAVIX) 75 MG tablet Take 75 mg by mouth daily.  . diphenoxylate-atropine (LOMOTIL) 2.5-0.025 MG per tablet Take 2 tablets by mouth 4 (four) times daily as needed for diarrhea or loose stools. Reported on 03/11/2016  . donepezil (ARICEPT) 10 MG tablet Take 10 mg by mouth at bedtime.  Marland Kitchen estropipate (OGEN) 0.75 MG  tablet Take 0.5 tablets (0.375 mg total) by mouth every other day.  Derrill Memo ON 08/02/2017] fentaNYL (DURAGESIC - DOSED MCG/HR) 50 MCG/HR Place 1 patch (50 mcg total) onto the skin every 3 (three) days.  Derrill Memo ON 09/01/2017] fentaNYL (DURAGESIC - DOSED MCG/HR) 50 MCG/HR Place 1 patch (50 mcg total) onto the skin every 3 (three) days.  Derrill Memo ON 10/01/2017] fentaNYL (DURAGESIC - DOSED MCG/HR) 50 MCG/HR Place 1 patch (50 mcg total) onto the skin every 3 (three) days.  . furosemide (LASIX) 40 MG tablet Take 40 mg by mouth every other day.   Marland Kitchen LYRICA 300 MG capsule Take 300 mg by mouth 2 (two) times daily.  . mupirocin ointment (BACTROBAN) 2 % Place 1 application into the nose 2 (two) times daily.  . pantoprazole (PROTONIX) 40 MG tablet take 1 tablet by mouth twice a day (Patient taking differently: Take 40 mg by mouth once daily)  . simvastatin (ZOCOR) 10 MG tablet Take 10 mg by mouth at bedtime. Reported on 12/04/2015  . SPIRIVA HANDIHALER 18 MCG inhalation capsule inhale the contents of one capsule in the handihaler once daily  . SUMAtriptan (IMITREX) 25 MG tablet Take 25 mg by mouth as needed for migraine. May repeat in 2 hours if headache persists or recurs.  Derrill Memo ON 08/02/2017] traMADol (ULTRAM) 50 MG tablet Take 2 tablets (100 mg total) by mouth every 6 (six) hours as needed for moderate pain or severe pain.  Marland Kitchen triamcinolone ointment (KENALOG) 0.5 % Apply to lesions on hands twice a day as needed  . [DISCONTINUED] fentaNYL (DURAGESIC - DOSED MCG/HR) 50 MCG/HR Place 1 patch (50 mcg total) onto the skin every 3 (three) days.  . [DISCONTINUED] fentaNYL (DURAGESIC - DOSED MCG/HR) 50 MCG/HR Place 1 patch (50 mcg total) onto the skin every 3 (three) days.  . [DISCONTINUED] fentaNYL (DURAGESIC - DOSED MCG/HR) 50 MCG/HR Place 1 patch (50 mcg total) onto the skin every 3 (three) days.  . [DISCONTINUED] fentaNYL (DURAGESIC - DOSED MCG/HR) 50 MCG/HR Place 1 patch (50 mcg total) onto the skin every 3 (three)  days.  . [DISCONTINUED] fentaNYL (DURAGESIC - DOSED MCG/HR) 50 MCG/HR Place 1 patch (50 mcg total) onto the skin every 3 (three) days.  . [DISCONTINUED] traMADol (ULTRAM) 50 MG tablet Take 2 tablets (100 mg total) by mouth every 6 (six) hours as needed for moderate pain or severe pain.  . [DISCONTINUED] traMADol (ULTRAM) 50 MG tablet Take 2 tablets (100 mg total) by mouth every 6 (six) hours as needed for moderate pain or severe pain.  . [DISCONTINUED] traMADol (ULTRAM) 50 MG tablet Take  2 tablets (100 mg total) by mouth every 6 (six) hours as needed for moderate pain or severe pain.    ROS  Constitutional: Denies any fever or chills Gastrointestinal: No reported hemesis, hematochezia, vomiting, or acute GI distress Musculoskeletal: Denies any acute onset joint swelling, redness, loss of ROM, or weakness Neurological: No reported episodes of acute onset apraxia, aphasia, dysarthria, agnosia, amnesia, paralysis, loss of coordination, or loss of consciousness  Allergies  Cheryl [name] is allergic to percocet [oxycodone-acetaminophen]; aspirin; codeine; propoxyphene; and sulfa antibiotics.  [place]  Drug: Cheryl [name]  reports that she does not use drugs. Alcohol:  reports that she does not drink alcohol. Tobacco:  reports that she has been smoking Cigarettes.  She has a 25.00 pack-year smoking history. She has never used smokeless tobacco. Medical:  has a past medical history of Allergy; Anxiety; Arthritis; Aspiration pneumonitis ([place]) (11/24/2015); Asthma; Chronic pain; COPD (chronic obstructive pulmonary disease) ([place]); Coronary artery disease; DVT (deep venous thrombosis) ([place]); GERD (gastroesophageal reflux disease); Headache; Hyperlipidemia; Hypertension; Migraines; Neuropathy (2010); Osteoporosis; Oxygen deficiency; Peripheral vascular disease ([place]); Pneumonia; Pneumonia (11/19/2015); Pneumonia (10/2016); and Vitamin D deficiency. Surgical: Cheryl [name]  has a past surgical history that includes  Appendectomy; Spine surgery; Foot surgery (Bilateral); cardiac catherization (10/31/2009); abdomnal aortic stent (05/30/2008); Abdominal hysterectomy (1975); Cholecystectomy (1972); Cervical fusion (C5 - 6/C6-7); Appendectomy; Colonoscopy with propofol (N/A, 07/27/2015); and Esophagogastroduodenoscopy (egd) with propofol (N/A, 07/27/2015). Family: family history includes Arthritis in her mother; Cancer in her brother, brother, and mother; Diabetes in her brother, mother, and sister; Heart disease in her father and mother.  Constitutional Exam  General appearance: Well nourished, well developed, and well hydrated. In no apparent acute distress Vitals:   07/21/17 1106  BP: (!) 145/78  Pulse: 76  Resp: 16  Temp: 98.4 F (36.9 C)  TempSrc: Oral  SpO2: 95%   BMI Assessment: Estimated body mass index is 17.31 kg/m as calculated from the following:   Height as of 07/04/17: '5\' 1"'$  (1.549 m).   Weight as of 07/22/17: 91 lb 9.6 oz (41.5 kg).  BMI interpretation table: BMI level Category Range association with higher incidence of chronic pain  <18 kg/m2 Underweight   18.5-24.9 kg/m2 Ideal body weight   25-29.9 kg/m2 Overweight Increased incidence by 20%  30-34.9 kg/m2 Obese (Class I) Increased incidence by 68%  35-39.9 kg/m2 Severe obesity (Class II) Increased incidence by 136%  >40 kg/m2 Extreme obesity (Class III) Increased incidence by 254%   BMI Readings from Last 4 Encounters:  07/22/17 17.31 kg/m  07/04/17 17.99 kg/m  06/13/17 19.65 kg/m  06/05/17 20.06 kg/m   Wt Readings from Last 4 Encounters:  07/22/17 91 lb 9.6 oz (41.5 kg)  07/04/17 95 lb 3.2 oz (43.2 kg)  06/13/17 94 lb (42.6 kg)  06/05/17 96 lb (43.5 kg)  Psych/Mental status: Alert, oriented x 3 (person, place, & time)       Eyes: PERLA Respiratory: No evidence of acute respiratory distress  Cervical Spine Area Exam  Skin & Axial Inspection: No masses, redness, edema, swelling, or associated skin lesions Alignment:  Symmetrical Functional ROM: Unrestricted ROM      Stability: No instability detected Muscle Tone/Strength: Functionally intact. No obvious neuro-muscular anomalies detected. Sensory (Neurological): Unimpaired Palpation: No palpable anomalies              Upper Extremity (UE) Exam    Side: Right upper extremity  Side: Left upper extremity  Skin & Extremity Inspection: Skin color, temperature, and hair growth are WNL. No  peripheral edema or cyanosis. No masses, redness, swelling, asymmetry, or associated skin lesions. No contractures.  Skin & Extremity Inspection: Skin color, temperature, and hair growth are WNL. No peripheral edema or cyanosis. No masses, redness, swelling, asymmetry, or associated skin lesions. No contractures.  Functional ROM: Unrestricted ROM          Functional ROM: Unrestricted ROM          Muscle Tone/Strength: Functionally intact. No obvious neuro-muscular anomalies detected.  Muscle Tone/Strength: Functionally intact. No obvious neuro-muscular anomalies detected.  Sensory (Neurological): Unimpaired          Sensory (Neurological): Unimpaired          Palpation: No palpable anomalies              Palpation: No palpable anomalies              Specialized Test(s): Deferred         Specialized Test(s): Deferred          Thoracic Spine Area Exam  Skin & Axial Inspection: prominent thoracic Kyphosis Alignment: Asymmetric Functional ROM: Minimal ROM Stability: No instability detected Muscle Tone/Strength: Increased muscle tone over affected area Sensory (Neurological): Movement associated pain Muscle strength & Tone: Complains of area being tender to palpation  Lumbar Spine Area Exam  Skin & Axial Inspection: No masses, redness, or swelling Alignment: Symmetrical Functional ROM: Minimal ROM      Stability: No instability detected Muscle Tone/Strength: Functionally intact. No obvious neuro-muscular anomalies detected. Sensory (Neurological): Movement-associated  pain Palpation: Complains of area being tender to palpation       Provocative Tests: Lumbar Hyperextension and rotation test: Positive bilaterally for facet joint pain. Lumbar Lateral bending test: evaluation deferred today       Patrick's Maneuver: evaluation deferred today                    Gait & Posture Assessment  Ambulation: Patient ambulates using a walker Gait: Very limited, using assistive device to ambulate Posture: Antalgic   Lower Extremity Exam    Side: Right lower extremity  Side: Left lower extremity  Skin & Extremity Inspection: Skin color, temperature, and hair growth are WNL. No peripheral edema or cyanosis. No masses, redness, swelling, asymmetry, or associated skin lesions. No contractures.  Skin & Extremity Inspection: Skin color, temperature, and hair growth are WNL. No peripheral edema or cyanosis. No masses, redness, swelling, asymmetry, or associated skin lesions. No contractures.  Functional ROM: Unrestricted ROM          Functional ROM: Unrestricted ROM          Muscle Tone/Strength: Functionally intact. No obvious neuro-muscular anomalies detected.  Muscle Tone/Strength: Functionally intact. No obvious neuro-muscular anomalies detected.  Sensory (Neurological): Unimpaired  Sensory (Neurological): Unimpaired  Palpation: No palpable anomalies  Palpation: No palpable anomalies   Assessment  Primary Diagnosis & Pertinent Problem List: The primary encounter diagnosis was Chronic low back pain (Location of Primary Source of Pain) (Bilateral) (L>R). Diagnoses of Lumbar facet syndrome (Location of Primary Source of Pain) (Bilateral) (L>R), Lumbar spondylosis, Chronic pain syndrome, Long term current use of opiate analgesic, Long term current use of anticoagulant therapy (Plavix), and Opiate use (160 MME/Day) were also pertinent to this visit.  Status Diagnosis  Persistent Controlled Controlled 1. Chronic low back pain (Location of Primary Source of Pain) (Bilateral)  (L>R)   2. Lumbar facet syndrome (Location of Primary Source of Pain) (Bilateral) (L>R)   3. Lumbar spondylosis   4.  Chronic pain syndrome   5. Long term current use of opiate analgesic   6. Long term current use of anticoagulant therapy (Plavix)   7. Opiate use (160 MME/Day)     Problems updated and reviewed during this visit: No problems updated. Plan of Care  Pharmacotherapy (Medications Ordered): Meds ordered this encounter  Medications  . fentaNYL (DURAGESIC - DOSED MCG/HR) 50 MCG/HR    Sig: Place 1 patch (50 mcg total) onto the skin every 3 (three) days.    Dispense:  10 patch    Refill:  0    Patient may have prescription filled one day early if pharmacy is closed on scheduled refill date. Do not fill until: 09/01/17 To last until: 10/01/17  . fentaNYL (DURAGESIC - DOSED MCG/HR) 50 MCG/HR    Sig: Place 1 patch (50 mcg total) onto the skin every 3 (three) days.    Dispense:  10 patch    Refill:  0    Patient may have prescription filled one day early if pharmacy is closed on scheduled refill date. Do not fill until: 10/01/17 To last until: 10/31/17  . traMADol (ULTRAM) 50 MG tablet    Sig: Take 2 tablets (100 mg total) by mouth every 6 (six) hours as needed for moderate pain or severe pain.    Dispense:  240 tablet    Refill:  2    Patient may have prescription filled one day early if pharmacy is closed on scheduled refill date. Do not fill until: 08/02/17 To last until: 10/31/17  . fentaNYL (DURAGESIC - DOSED MCG/HR) 50 MCG/HR    Sig: Place 1 patch (50 mcg total) onto the skin every 3 (three) days.    Dispense:  10 patch    Refill:  0    Patient may have prescription filled one day early if pharmacy is closed on scheduled refill date. Do not fill until: 08/02/2017 To last until: 09/01/2017   New Prescriptions   No medications on file   Medications administered today: Cheryl [name] had no medications administered during this visit.   Procedure Orders      Radiofrequency,Lumbar Lab Orders  No laboratory test(s) ordered today   Imaging Orders  No imaging studies ordered today   Referral Orders  No referral(s) requested today    Interventional management options: Planned, scheduled, and/or pending:   Therapeutic right-sided lumbar facet radiofrequency ablation under fluoroscopic guidance and IV sedation   Considering:   Bilateral lumbar facet radiofrequency ablation    Palliative PRN treatment(s):   Palliative bilateral lumbar facet block  Bilateral lumbar facet radiofrequency ablation    Provider-requested follow-up: Return in about 3 months (around 10/21/2017) for Med-Mgmt by Dionisio David, NP, (Blood-thinner Protocol).  Future Appointments Date Time Provider [place]  08/04/2017 11:00 AM Birdie Sons, MD [facility] None  08/12/2017 2:15 PM Milinda Pointer, MD [facility] None  10/07/2017 11:00 AM Vevelyn Francois, NP [facility] None   Primary Care Physician: Birdie Sons, MD Location: [facility] Note by: Gaspar Cola, MD Date: 07/21/2017; Time: 12:32 PM

## 2017-07-22 ENCOUNTER — Ambulatory Visit
Admission: RE | Admit: 2017-07-22 | Discharge: 2017-07-22 | Disposition: A | Discharge status: Home / Self Care | Payer: Medicare HMO | Source: Ambulatory Visit | Attending: Family Medicine | Admitting: Family Medicine

## 2017-07-22 ENCOUNTER — Encounter: Payer: Self-pay | Admitting: Family Medicine

## 2017-07-22 ENCOUNTER — Telehealth: Payer: Self-pay

## 2017-07-22 ENCOUNTER — Ambulatory Visit (INDEPENDENT_AMBULATORY_CARE_PROVIDER_SITE_OTHER): Payer: Medicare HMO | Admitting: Family Medicine

## 2017-07-22 VITALS — HR 85 | Temp 98.1°F | Resp 16 | Wt 91.6 lb

## 2017-07-22 DIAGNOSIS — K219 Gastro-esophageal reflux disease without esophagitis: Secondary | ICD-10-CM | POA: Diagnosis not present

## 2017-07-22 DIAGNOSIS — I10 Essential (primary) hypertension: Secondary | ICD-10-CM | POA: Diagnosis not present

## 2017-07-22 DIAGNOSIS — M25551 Pain in right hip: Secondary | ICD-10-CM | POA: Insufficient documentation

## 2017-07-22 DIAGNOSIS — J441 Chronic obstructive pulmonary disease with (acute) exacerbation: Secondary | ICD-10-CM | POA: Diagnosis not present

## 2017-07-22 DIAGNOSIS — J9611 Chronic respiratory failure with hypoxia: Secondary | ICD-10-CM | POA: Diagnosis not present

## 2017-07-22 DIAGNOSIS — S32511A Fracture of superior rim of right pubis, initial encounter for closed fracture: Secondary | ICD-10-CM | POA: Insufficient documentation

## 2017-07-22 DIAGNOSIS — G8929 Other chronic pain: Secondary | ICD-10-CM | POA: Diagnosis not present

## 2017-07-22 DIAGNOSIS — X58XXXA Exposure to other specified factors, initial encounter: Secondary | ICD-10-CM | POA: Insufficient documentation

## 2017-07-22 DIAGNOSIS — G629 Polyneuropathy, unspecified: Secondary | ICD-10-CM | POA: Diagnosis not present

## 2017-07-22 DIAGNOSIS — I2510 Atherosclerotic heart disease of native coronary artery without angina pectoris: Secondary | ICD-10-CM | POA: Diagnosis not present

## 2017-07-22 DIAGNOSIS — I739 Peripheral vascular disease, unspecified: Secondary | ICD-10-CM | POA: Diagnosis not present

## 2017-07-22 DIAGNOSIS — M810 Age-related osteoporosis without current pathological fracture: Secondary | ICD-10-CM | POA: Diagnosis not present

## 2017-07-22 MED ORDER — METHYLPREDNISOLONE ACETATE 80 MG/ML IJ SUSP
80.0000 mg | Freq: Once | INTRAMUSCULAR | Status: AC
  Administered 2017-07-22: 80 mg via INTRAMUSCULAR

## 2017-07-22 NOTE — Progress Notes (Signed)
Patient: Cheryl [name] Female    DOB: November 01, 1951   66 y.o.   MRN: [id number] Visit Date: 07/22/2017  Today's Provider: Mila Merry, MD   Chief Complaint  Patient presents with  . Hip Pain   Subjective:    Patient has had right hip pain for several days. Patient states pain is radiating into her lower back. No injury mechanism. However patient has fallen a couple times lately.     Hip Pain   The incident occurred 3 to 5 days ago. There was no injury mechanism. The pain is present in the right hip. The quality of the pain is described as cramping and aching. The pain is moderate. The pain has been constant since onset. She reports no foreign bodies present. The symptoms are aggravated by movement and weight bearing. She has tried nothing for the symptoms.       Allergies  Allergen Reactions  . Percocet [Oxycodone-Acetaminophen] Hives and Rash  . Aspirin Nausea And Vomiting and Other (See Comments)    Reaction:  GI upset   . Codeine Nausea And Vomiting, Other (See Comments) and Nausea Only    Reaction:  GI upset   . Propoxyphene Other (See Comments) and Nausea Only    GI upset Reaction:  GI upset   . Sulfa Antibiotics Rash and Other (See Comments)    Reaction:  GI upset      Current Outpatient Prescriptions:  .  acetaminophen (TYLENOL) 325 MG tablet, Take 2 tablets (650 mg total) by mouth every 6 (six) hours as needed for mild pain (or Fever >/= 101)., Disp: 30 tablet, Rfl: 0 .  ADVAIR DISKUS 250-50 MCG/DOSE AEPB, inhale 1 dose by mouth twice a day, Disp: 60 each, Rfl: 11 .  albuterol (PROVENTIL HFA;VENTOLIN HFA) 108 (90 BASE) MCG/ACT inhaler, Inhale 2 puffs into the lungs every 4 (four) hours as needed for wheezing or shortness of breath., Disp: 18 g, Rfl: 3 .  albuterol (PROVENTIL) (2.5 MG/3ML) 0.083% nebulizer solution, Take 3 mLs (2.5 mg total) by nebulization every 4 (four) hours as needed for wheezing., Disp: 75 mL, Rfl: 12 .  alendronate (FOSAMAX) 70 MG  tablet, Take 1 tablet (70 mg total) by mouth every 7 (seven) days. Take with a full glass of water on an empty stomach., Disp: 4 tablet, Rfl: 11 .  ALPRAZolam (XANAX) 0.5 MG tablet, Take 1 tablet (0.5 mg total) by mouth 3 (three) times daily as needed for anxiety., Disp: 60 tablet, Rfl: 3 .  celecoxib (CELEBREX) 100 MG capsule, Take 1 capsule (100 mg total) by mouth 2 (two) times daily as needed., Disp: 60 capsule, Rfl: 1 .  clopidogrel (PLAVIX) 75 MG tablet, Take 75 mg by mouth daily., Disp: , Rfl:  .  diphenoxylate-atropine (LOMOTIL) 2.5-0.025 MG per tablet, Take 2 tablets by mouth 4 (four) times daily as needed for diarrhea or loose stools. Reported on 03/11/2016, Disp: , Rfl:  .  donepezil (ARICEPT) 10 MG tablet, Take 10 mg by mouth at bedtime., Disp: , Rfl:  .  estropipate (OGEN) 0.75 MG tablet, Take 0.5 tablets (0.375 mg total) by mouth every other day., Disp: 30 tablet, Rfl: 3 .  [START ON 08/02/2017] fentaNYL (DURAGESIC - DOSED MCG/HR) 50 MCG/HR, Place 1 patch (50 mcg total) onto the skin every 3 (three) days., Disp: 10 patch, Rfl: 0 .  [START ON 09/01/2017] fentaNYL (DURAGESIC - DOSED MCG/HR) 50 MCG/HR, Place 1 patch (50 mcg total) onto the skin every 3 (  three) days., Disp: 10 patch, Rfl: 0 .  [START ON 10/01/2017] fentaNYL (DURAGESIC - DOSED MCG/HR) 50 MCG/HR, Place 1 patch (50 mcg total) onto the skin every 3 (three) days., Disp: 10 patch, Rfl: 0 .  furosemide (LASIX) 40 MG tablet, Take 40 mg by mouth every other day. , Disp: , Rfl:  .  LYRICA 300 MG capsule, Take 300 mg by mouth 2 (two) times daily., Disp: , Rfl: 0 .  mupirocin ointment (BACTROBAN) 2 %, Place 1 application into the nose 2 (two) times daily., Disp: 30 g, Rfl: 2 .  pantoprazole (PROTONIX) 40 MG tablet, take 1 tablet by mouth twice a day (Patient taking differently: Take 40 mg by mouth once daily), Disp: 60 tablet, Rfl: 12 .  simvastatin (ZOCOR) 10 MG tablet, Take 10 mg by mouth at bedtime. Reported on 12/04/2015, Disp: , Rfl:  .   SPIRIVA HANDIHALER 18 MCG inhalation capsule, inhale the contents of one capsule in the handihaler once daily, Disp: 30 capsule, Rfl: 12 .  SUMAtriptan (IMITREX) 25 MG tablet, Take 25 mg by mouth as needed for migraine. May repeat in 2 hours if headache persists or recurs., Disp: , Rfl:  .  [START ON 08/02/2017] traMADol (ULTRAM) 50 MG tablet, Take 2 tablets (100 mg total) by mouth every 6 (six) hours as needed for moderate pain or severe pain., Disp: 240 tablet, Rfl: 2 .  triamcinolone ointment (KENALOG) 0.5 %, Apply to lesions on hands twice a day as needed, Disp: 30 g, Rfl: 1  Review of Systems  Constitutional: Negative for appetite change, chills, fatigue and fever.  Respiratory: Negative for chest tightness and shortness of breath.   Cardiovascular: Negative for chest pain and palpitations.  Gastrointestinal: Negative for abdominal pain, nausea and vomiting.  Skin: Positive for color change.  Neurological: Negative for dizziness and weakness.    Social History  Substance Use Topics  . Smoking status: Current Every Day Smoker    Packs/day: 0.50    Years: 50.00    Types: Cigarettes  . Smokeless tobacco: Never Used     Comment: Previously smoked 2 ppd  . Alcohol use No   Objective:   Pulse 85   Temp 98.1 F (36.7 C) (Oral)   Resp 16   Wt 91 lb 9.6 oz (41.5 kg)   SpO2 95%   BMI 17.31 kg/m  Vitals:   07/22/17 1550  Pulse: 85  Resp: 16  Temp: 98.1 F (36.7 C)  TempSrc: Oral  SpO2: 95%  Weight: 91 lb 9.6 oz (41.5 kg)     Physical Exam  General appearance: alert, well developed, well nourished, cooperative and in no distress Head: Normocephalic, without obvious abnormality, atraumatic Respiratory: Respirations even and unlabored, normal respiratory rate Skin: Several healing dry abrasions on both legs. Dull non tender non-blanching erythema posterior lower legs. Skin: MS: Very slow to stand, tender right lateral upper leg and right side of lower back. Unable to to test  hip range of motion due to patient's limited mobility. No gross deformities of hip and back.     Assessment & Plan:     1. Right hip pain Likely bursitis, but unable to perform adequate evaluation of hip mobility. Wi - DG HIP UNILAT W OR W/O PELVIS 2-3 VIEWS RIGHT; Future - methylPREDNISolone acetate (DEPO-MEDROL) injection 80 mg; Inject 1 mL (80 mg total) into the muscle once.       Mila Merry, MD  [facility]

## 2017-07-23 ENCOUNTER — Telehealth: Payer: Self-pay | Admitting: Family Medicine

## 2017-07-23 DIAGNOSIS — S72001A Fracture of unspecified part of neck of right femur, initial encounter for closed fracture: Secondary | ICD-10-CM

## 2017-07-23 DIAGNOSIS — S329XXA Fracture of unspecified parts of lumbosacral spine and pelvis, initial encounter for closed fracture: Principal | ICD-10-CM

## 2017-07-23 DIAGNOSIS — S3282XA Multiple fractures of pelvis without disruption of pelvic ring, initial encounter for closed fracture: Secondary | ICD-10-CM

## 2017-07-23 NOTE — Telephone Encounter (Signed)
error 

## 2017-07-23 NOTE — Telephone Encounter (Signed)
Pt's husband calling about the xray she had done yesterday,  Please advise  Thanks teri

## 2017-07-24 DIAGNOSIS — S32501A Unspecified fracture of right pubis, initial encounter for closed fracture: Secondary | ICD-10-CM | POA: Diagnosis not present

## 2017-07-24 NOTE — Telephone Encounter (Signed)
Please refer to orthopedics ASAP. Patient's husband [name] would like to be called with the referral info. Thanks! [phone].

## 2017-07-24 NOTE — Telephone Encounter (Signed)
Patient's husband was notified of results. Referral in epic.

## 2017-07-24 NOTE — Telephone Encounter (Signed)
-----   Message from Malva Limesonald E Fisher, MD sent at 07/23/2017  2:00 PM EDT ----- xrays show fracture of her hip. She needs referral to orthopedics ASAP.

## 2017-07-25 DIAGNOSIS — I10 Essential (primary) hypertension: Secondary | ICD-10-CM | POA: Diagnosis not present

## 2017-07-25 DIAGNOSIS — G629 Polyneuropathy, unspecified: Secondary | ICD-10-CM | POA: Diagnosis not present

## 2017-07-25 DIAGNOSIS — I739 Peripheral vascular disease, unspecified: Secondary | ICD-10-CM | POA: Diagnosis not present

## 2017-07-25 DIAGNOSIS — M810 Age-related osteoporosis without current pathological fracture: Secondary | ICD-10-CM | POA: Diagnosis not present

## 2017-07-25 DIAGNOSIS — G8929 Other chronic pain: Secondary | ICD-10-CM | POA: Diagnosis not present

## 2017-07-25 DIAGNOSIS — J9611 Chronic respiratory failure with hypoxia: Secondary | ICD-10-CM | POA: Diagnosis not present

## 2017-07-25 DIAGNOSIS — I2510 Atherosclerotic heart disease of native coronary artery without angina pectoris: Secondary | ICD-10-CM | POA: Diagnosis not present

## 2017-07-25 DIAGNOSIS — K219 Gastro-esophageal reflux disease without esophagitis: Secondary | ICD-10-CM | POA: Diagnosis not present

## 2017-07-25 DIAGNOSIS — J441 Chronic obstructive pulmonary disease with (acute) exacerbation: Secondary | ICD-10-CM | POA: Diagnosis not present

## 2017-07-28 DIAGNOSIS — G629 Polyneuropathy, unspecified: Secondary | ICD-10-CM | POA: Diagnosis not present

## 2017-07-28 DIAGNOSIS — G8929 Other chronic pain: Secondary | ICD-10-CM | POA: Diagnosis not present

## 2017-07-28 DIAGNOSIS — J441 Chronic obstructive pulmonary disease with (acute) exacerbation: Secondary | ICD-10-CM | POA: Diagnosis not present

## 2017-07-28 DIAGNOSIS — K219 Gastro-esophageal reflux disease without esophagitis: Secondary | ICD-10-CM | POA: Diagnosis not present

## 2017-07-28 DIAGNOSIS — I739 Peripheral vascular disease, unspecified: Secondary | ICD-10-CM | POA: Diagnosis not present

## 2017-07-28 DIAGNOSIS — M810 Age-related osteoporosis without current pathological fracture: Secondary | ICD-10-CM | POA: Diagnosis not present

## 2017-07-28 DIAGNOSIS — I2510 Atherosclerotic heart disease of native coronary artery without angina pectoris: Secondary | ICD-10-CM | POA: Diagnosis not present

## 2017-07-28 DIAGNOSIS — J9611 Chronic respiratory failure with hypoxia: Secondary | ICD-10-CM | POA: Diagnosis not present

## 2017-07-28 DIAGNOSIS — I10 Essential (primary) hypertension: Secondary | ICD-10-CM | POA: Diagnosis not present

## 2017-07-29 ENCOUNTER — Telehealth: Payer: Self-pay | Admitting: Family Medicine

## 2017-07-29 DIAGNOSIS — G8929 Other chronic pain: Secondary | ICD-10-CM | POA: Diagnosis not present

## 2017-07-29 DIAGNOSIS — I2510 Atherosclerotic heart disease of native coronary artery without angina pectoris: Secondary | ICD-10-CM | POA: Diagnosis not present

## 2017-07-29 DIAGNOSIS — J441 Chronic obstructive pulmonary disease with (acute) exacerbation: Secondary | ICD-10-CM | POA: Diagnosis not present

## 2017-07-29 DIAGNOSIS — I10 Essential (primary) hypertension: Secondary | ICD-10-CM | POA: Diagnosis not present

## 2017-07-29 DIAGNOSIS — K219 Gastro-esophageal reflux disease without esophagitis: Secondary | ICD-10-CM | POA: Diagnosis not present

## 2017-07-29 DIAGNOSIS — J9611 Chronic respiratory failure with hypoxia: Secondary | ICD-10-CM | POA: Diagnosis not present

## 2017-07-29 DIAGNOSIS — I739 Peripheral vascular disease, unspecified: Secondary | ICD-10-CM | POA: Diagnosis not present

## 2017-07-29 DIAGNOSIS — G629 Polyneuropathy, unspecified: Secondary | ICD-10-CM | POA: Diagnosis not present

## 2017-07-29 DIAGNOSIS — M810 Age-related osteoporosis without current pathological fracture: Secondary | ICD-10-CM | POA: Diagnosis not present

## 2017-07-29 NOTE — Telephone Encounter (Signed)
Cheryl [name] had a fall last night in the bath room   No injuries.  Already seeing PT.Lorain Childes. FYI     Thanks teri

## 2017-08-01 DIAGNOSIS — G629 Polyneuropathy, unspecified: Secondary | ICD-10-CM | POA: Diagnosis not present

## 2017-08-01 DIAGNOSIS — J9611 Chronic respiratory failure with hypoxia: Secondary | ICD-10-CM | POA: Diagnosis not present

## 2017-08-01 DIAGNOSIS — K219 Gastro-esophageal reflux disease without esophagitis: Secondary | ICD-10-CM | POA: Diagnosis not present

## 2017-08-01 DIAGNOSIS — I739 Peripheral vascular disease, unspecified: Secondary | ICD-10-CM | POA: Diagnosis not present

## 2017-08-01 DIAGNOSIS — I10 Essential (primary) hypertension: Secondary | ICD-10-CM | POA: Diagnosis not present

## 2017-08-01 DIAGNOSIS — I2510 Atherosclerotic heart disease of native coronary artery without angina pectoris: Secondary | ICD-10-CM | POA: Diagnosis not present

## 2017-08-01 DIAGNOSIS — M810 Age-related osteoporosis without current pathological fracture: Secondary | ICD-10-CM | POA: Diagnosis not present

## 2017-08-01 DIAGNOSIS — J441 Chronic obstructive pulmonary disease with (acute) exacerbation: Secondary | ICD-10-CM | POA: Diagnosis not present

## 2017-08-01 DIAGNOSIS — G8929 Other chronic pain: Secondary | ICD-10-CM | POA: Diagnosis not present

## 2017-08-04 ENCOUNTER — Emergency Department: Payer: Medicare HMO

## 2017-08-04 ENCOUNTER — Emergency Department
Admission: EM | Admit: 2017-08-04 | Discharge: 2017-08-05 | Disposition: A | Discharge status: Home / Self Care | Payer: Medicare HMO | Attending: Emergency Medicine | Admitting: Emergency Medicine

## 2017-08-04 ENCOUNTER — Ambulatory Visit: Payer: Self-pay | Admitting: Family Medicine

## 2017-08-04 ENCOUNTER — Encounter: Payer: Self-pay | Admitting: Emergency Medicine

## 2017-08-04 DIAGNOSIS — R634 Abnormal weight loss: Secondary | ICD-10-CM | POA: Diagnosis not present

## 2017-08-04 DIAGNOSIS — R9431 Abnormal electrocardiogram [ECG] [EKG]: Secondary | ICD-10-CM | POA: Diagnosis not present

## 2017-08-04 DIAGNOSIS — Z86718 Personal history of other venous thrombosis and embolism: Secondary | ICD-10-CM | POA: Diagnosis not present

## 2017-08-04 DIAGNOSIS — I2510 Atherosclerotic heart disease of native coronary artery without angina pectoris: Secondary | ICD-10-CM | POA: Insufficient documentation

## 2017-08-04 DIAGNOSIS — F0390 Unspecified dementia without behavioral disturbance: Secondary | ICD-10-CM | POA: Diagnosis not present

## 2017-08-04 DIAGNOSIS — J441 Chronic obstructive pulmonary disease with (acute) exacerbation: Secondary | ICD-10-CM | POA: Diagnosis not present

## 2017-08-04 DIAGNOSIS — G629 Polyneuropathy, unspecified: Secondary | ICD-10-CM | POA: Diagnosis not present

## 2017-08-04 DIAGNOSIS — Z7901 Long term (current) use of anticoagulants: Secondary | ICD-10-CM | POA: Diagnosis not present

## 2017-08-04 DIAGNOSIS — J45909 Unspecified asthma, uncomplicated: Secondary | ICD-10-CM | POA: Insufficient documentation

## 2017-08-04 DIAGNOSIS — Y999 Unspecified external cause status: Secondary | ICD-10-CM | POA: Insufficient documentation

## 2017-08-04 DIAGNOSIS — Z79899 Other long term (current) drug therapy: Secondary | ICD-10-CM | POA: Diagnosis not present

## 2017-08-04 DIAGNOSIS — J449 Chronic obstructive pulmonary disease, unspecified: Secondary | ICD-10-CM | POA: Insufficient documentation

## 2017-08-04 DIAGNOSIS — I739 Peripheral vascular disease, unspecified: Secondary | ICD-10-CM | POA: Diagnosis not present

## 2017-08-04 DIAGNOSIS — Y939 Activity, unspecified: Secondary | ICD-10-CM | POA: Insufficient documentation

## 2017-08-04 DIAGNOSIS — J9611 Chronic respiratory failure with hypoxia: Secondary | ICD-10-CM | POA: Diagnosis not present

## 2017-08-04 DIAGNOSIS — S2232XA Fracture of one rib, left side, initial encounter for closed fracture: Secondary | ICD-10-CM | POA: Diagnosis not present

## 2017-08-04 DIAGNOSIS — R1012 Left upper quadrant pain: Secondary | ICD-10-CM | POA: Diagnosis not present

## 2017-08-04 DIAGNOSIS — W1809XA Striking against other object with subsequent fall, initial encounter: Secondary | ICD-10-CM | POA: Diagnosis not present

## 2017-08-04 DIAGNOSIS — M810 Age-related osteoporosis without current pathological fracture: Secondary | ICD-10-CM | POA: Diagnosis not present

## 2017-08-04 DIAGNOSIS — I10 Essential (primary) hypertension: Secondary | ICD-10-CM | POA: Diagnosis not present

## 2017-08-04 DIAGNOSIS — F17210 Nicotine dependence, cigarettes, uncomplicated: Secondary | ICD-10-CM | POA: Diagnosis not present

## 2017-08-04 DIAGNOSIS — W19XXXA Unspecified fall, initial encounter: Secondary | ICD-10-CM

## 2017-08-04 DIAGNOSIS — K219 Gastro-esophageal reflux disease without esophagitis: Secondary | ICD-10-CM | POA: Diagnosis not present

## 2017-08-04 DIAGNOSIS — R296 Repeated falls: Secondary | ICD-10-CM | POA: Diagnosis not present

## 2017-08-04 DIAGNOSIS — G8929 Other chronic pain: Secondary | ICD-10-CM | POA: Diagnosis not present

## 2017-08-04 DIAGNOSIS — Y92002 Bathroom of unspecified non-institutional (private) residence single-family (private) house as the place of occurrence of the external cause: Secondary | ICD-10-CM | POA: Insufficient documentation

## 2017-08-04 DIAGNOSIS — S299XXA Unspecified injury of thorax, initial encounter: Secondary | ICD-10-CM | POA: Diagnosis present

## 2017-08-04 LAB — COMPREHENSIVE METABOLIC PANEL
ALK PHOS: 84 U/L (ref 38–126)
ALT: 10 U/L — ABNORMAL LOW (ref 14–54)
ANION GAP: 7 (ref 5–15)
AST: 18 U/L (ref 15–41)
Albumin: 3.4 g/dL — ABNORMAL LOW (ref 3.5–5.0)
BILIRUBIN TOTAL: 0.3 mg/dL (ref 0.3–1.2)
BUN: 22 mg/dL — ABNORMAL HIGH (ref 6–20)
CALCIUM: 8.5 mg/dL — AB (ref 8.9–10.3)
CO2: 27 mmol/L (ref 22–32)
Chloride: 106 mmol/L (ref 101–111)
Creatinine, Ser: 1.08 mg/dL — ABNORMAL HIGH (ref 0.44–1.00)
GFR calc non Af Amer: 52 mL/min — ABNORMAL LOW (ref >60)
GFR, EST AFRICAN AMERICAN: 60 mL/min — AB (ref >60)
Glucose, Bld: 113 mg/dL — ABNORMAL HIGH (ref 65–99)
POTASSIUM: 4 mmol/L (ref 3.5–5.1)
SODIUM: 140 mmol/L (ref 135–145)
TOTAL PROTEIN: 6.7 g/dL (ref 6.5–8.1)

## 2017-08-04 LAB — CBC WITH DIFFERENTIAL/PLATELET
BASOS ABS: 0.1 10*3/uL (ref 0–0.1)
BASOS PCT: 1 %
Eosinophils Absolute: 0.1 10*3/uL (ref 0–0.7)
Eosinophils Relative: 2 %
HCT: 26.7 % — ABNORMAL LOW (ref 35.0–47.0)
HEMOGLOBIN: 8.7 g/dL — AB (ref 12.0–16.0)
Lymphocytes Relative: 16 %
Lymphs Abs: 1 10*3/uL (ref 1.0–3.6)
MCH: 27.2 pg (ref 26.0–34.0)
MCHC: 32.6 g/dL (ref 32.0–36.0)
MCV: 83.5 fL (ref 80.0–100.0)
Monocytes Absolute: 0.5 10*3/uL (ref 0.2–0.9)
Monocytes Relative: 8 %
NEUTROS ABS: 4.6 10*3/uL (ref 1.4–6.5)
NEUTROS PCT: 73 %
Platelets: 224 10*3/uL (ref 150–440)
RBC: 3.2 MIL/uL — AB (ref 3.80–5.20)
RDW: 16.9 % — ABNORMAL HIGH (ref 11.5–14.5)
WBC: 6.4 10*3/uL (ref 3.6–11.0)

## 2017-08-04 LAB — TROPONIN I: Troponin I: <0.03 ng/mL (ref <0.03)

## 2017-08-04 MED ORDER — IOPAMIDOL (ISOVUE-300) INJECTION 61%
60.0000 mL | Freq: Once | INTRAVENOUS | Status: AC | PRN
  Administered 2017-08-04: 60 mL via INTRAVENOUS

## 2017-08-04 MED ORDER — ONDANSETRON HCL 4 MG/2ML IJ SOLN
4.0000 mg | Freq: Once | INTRAMUSCULAR | Status: AC
  Administered 2017-08-04: 4 mg via INTRAVENOUS
  Filled 2017-08-04: qty 2

## 2017-08-04 MED ORDER — MORPHINE SULFATE (PF) 4 MG/ML IV SOLN
4.0000 mg | Freq: Once | INTRAVENOUS | Status: AC
  Administered 2017-08-04: 4 mg via INTRAVENOUS
  Filled 2017-08-04: qty 1

## 2017-08-04 NOTE — ED Notes (Signed)
Pt resting in bed, noted to be curled up in fetal position. Pt placed on 2L via [place] at this time. Pt noted to be asleep, family at bedside. Will continue to monitor for further patient needs.

## 2017-08-04 NOTE — ED Triage Notes (Signed)
Pt presents to ED via [facility] s/p fall. EMS reports multiple falls throughout today, and reports tonight pt fell backwards and landed against a tub or toilet. Pt presents with c/o pain to her L posterior rib cage, crepitus noted with palpation when patient takes a deep breath. Pt presents in the fetal position as the position of comfort. Pt presents with a 20g to L wrist, EMS reports hx of COPD and osteoarthritis. MD notified of patient, and presents to bedside at this time. Pt is noted to be moaning and groaning in pain.

## 2017-08-04 NOTE — ED Provider Notes (Signed)
[facility] Emergency Department Provider Note ____________________________________________   First MD Initiated Contact with Patient 08/04/17 2120     (approximate)  I have reviewed the triage vital signs and the nursing notes.   HISTORY  Chief Complaint Fall and Rib Injury    HPI Cheryl [name] is a 66 y.o. female With past medical history as noted who presents with left back and side pain acute onset after a fall, worsened with deep breaths, and not associated with head injury or lower extremity injuries. Per EMS she fell against a tub or toilet.  Patient states that she doesn't remember how she fell but states she did not pass out and that she falls "a lot" so this is not unusual for her.  She denies feeling dizzy or lightheaded.   Past Medical History:  Diagnosis Date  . Allergy   . Anxiety   . Arthritis   . Aspiration pneumonitis (HCC) 11/24/2015  . Asthma   . Chronic pain   . COPD (chronic obstructive pulmonary disease) (HCC)   . Coronary artery disease   . DVT (deep venous thrombosis) (HCC)   . GERD (gastroesophageal reflux disease)   . Headache   . Hyperlipidemia   . Hypertension   . Migraines   . Neuropathy 2010  . Osteoporosis   . Oxygen deficiency   . Peripheral vascular disease (HCC)   . Pneumonia   . Pneumonia 11/19/2015  . Pneumonia 10/2016  . Vitamin D deficiency     Patient Active Problem List   Diagnosis Date Noted  . Marijuana use 06/05/2017  . Pneumonia 04/29/2017  . Metabolic encephalopathy 12/23/2016  . Hypokalemia 12/23/2016  . Protein-calorie malnutrition, severe 11/06/2016  . Nausea with vomiting   . Lumbar facet syndrome (Location of Primary Source of Pain) (Bilateral) (L>R) 03/20/2016  . Lumbar spondylosis 03/20/2016  . Opiate use (160 MME/Day) 02/28/2016  . Anemia 02/28/2016  . Osteoarthrosis 12/18/2015  . Long term current use of anticoagulant therapy (Plavix) 12/18/2015  . Long term current use of  opiate analgesic 12/04/2015  . Fibromyalgia 12/04/2015  . Dysphagia 11/24/2015  . Acute diastolic CHF (congestive heart failure) (HCC) 11/24/2015  . Leukocytosis 11/24/2015  . Abnormal mammogram of right breast 10/11/2015  . Dilated intrahepatic bile duct 10/11/2015  . Lumbar radicular pain (Bilateral) (L>R) (L4) 09/04/2015  . Chronic low back pain (Location of Primary Source of Pain) (Bilateral) (L>R) 09/04/2015  . Encounter for therapeutic drug level monitoring 09/04/2015  . Uncomplicated opioid dependence (HCC) 09/04/2015  . Chronic pain syndrome 09/04/2015  . Platelet inhibition due to Plavix 09/04/2015  . COPD (chronic obstructive pulmonary disease) (HCC) 07/31/2015  . Dementia 07/31/2015  . Airway hyperreactivity 07/03/2015  . Back pain, thoracic 07/03/2015  . Excessive falling 07/03/2015  . Alteration in bowel elimination: incontinence 07/03/2015  . Insomnia 07/03/2015  . Decreased testosterone level 07/03/2015  . Leg weakness 07/03/2015  . Menopausal symptom 07/03/2015  . Migraine 07/03/2015  . Neuropathy (HCC) 07/03/2015  . Fecal occult blood test positive 07/03/2015  . OP (osteoporosis) 07/03/2015  . Panic disorder 07/03/2015  . Compulsive tobacco user syndrome 07/03/2015  . Urinary incontinence 07/03/2015  . Weight loss 07/03/2015  . Essential hypertension 06/06/2015  . GERD (gastroesophageal reflux disease) 06/06/2015  . Hyperlipemia 06/06/2015  . Peripheral nerve disease 04/13/2014  . Anxiety 02/10/2014  . Coronary artery disease 02/10/2014  . Peripheral vascular disease (HCC) 02/10/2014  . Vitamin D deficiency 10/16/2009    Past Surgical History:  Procedure  Laterality Date  . ABDOMINAL HYSTERECTOMY  1975   Bilaterl Oophorectomy; Dur to IUD infection  . abdomnal aortic stent  05/30/2008   Dr. Nanetta Batty  . APPENDECTOMY    . APPENDECTOMY    . cardiac catherization  10/31/2009  . CERVICAL FUSION  C5 - 6/C6-7  . CHOLECYSTECTOMY  1972  . COLONOSCOPY  WITH PROPOFOL N/A 07/27/2015   Procedure: COLONOSCOPY WITH PROPOFOL;  Surgeon: Wallace Cullens, MD;  Location: [facility] ENDOSCOPY;  Service: Gastroenterology;  Laterality: N/A;  . ESOPHAGOGASTRODUODENOSCOPY (EGD) WITH PROPOFOL N/A 07/27/2015   Procedure: ESOPHAGOGASTRODUODENOSCOPY (EGD) WITH PROPOFOL;  Surgeon: Wallace Cullens, MD;  Location[facility] ENDOSCOPY;  Service: Gastroenterology;  Laterality: N/A;  . FOOT SURGERY Bilateral    5-6 years per patient  . SPINE SURGERY      Prior to Admission medications   Medication Sig Start Date End Date Taking? Authorizing Provider  acetaminophen (TYLENOL) 325 MG tablet Take 2 tablets (650 mg total) by mouth every 6 (six) hours as needed for mild pain (or Fever >/= 101). 12/25/16  Yes Enid Baas, MD  ADVAIR DISKUS 250-50 MCG/DOSE AEPB inhale 1 dose by mouth twice a day 08/15/16  Yes Fisher, Demetrios Isaacs, MD  albuterol (PROVENTIL HFA;VENTOLIN HFA) 108 (90 BASE) MCG/ACT inhaler Inhale 2 puffs into the lungs every 4 (four) hours as needed for wheezing or shortness of breath. 08/11/15  Yes Malva Limes, MD  albuterol (PROVENTIL) (2.5 MG/3ML) 0.083% nebulizer solution Take 3 mLs (2.5 mg total) by nebulization every 4 (four) hours as needed for wheezing. 10/12/15  Yes Katharina Caper, MD  alendronate (FOSAMAX) 70 MG tablet Take 1 tablet (70 mg total) by mouth every 7 (seven) days. Take with a full glass of water on an empty stomach. 06/13/17  Yes Fisher, Demetrios Isaacs, MD  ALPRAZolam Prudy Feeler) 0.5 MG tablet Take 1 tablet (0.5 mg total) by mouth 3 (three) times daily as needed for anxiety. 06/03/17  Yes Malva Limes, MD  celecoxib (CELEBREX) 100 MG capsule Take 1 capsule (100 mg total) by mouth 2 (two) times daily as needed. 06/03/17  Yes Malva Limes, MD  clopidogrel (PLAVIX) 75 MG tablet Take 75 mg by mouth daily.   Yes [provider]  diphenoxylate-atropine (LOMOTIL) 2.5-0.025 MG per tablet Take 2 tablets by mouth 4 (four) times daily as needed for diarrhea or loose  stools. Reported on 03/11/2016   Yes [provider]  donepezil (ARICEPT) 10 MG tablet Take 10 mg by mouth at bedtime.   Yes [provider]  estropipate (OGEN) 0.75 MG tablet Take 0.5 tablets (0.375 mg total) by mouth every other day. 06/03/17  Yes Malva Limes, MD  fentaNYL (DURAGESIC - DOSED MCG/HR) 50 MCG/HR Place 1 patch (50 mcg total) onto the skin every 3 (three) days. 08/02/17 09/01/17 Yes Delano Metz, MD  fentaNYL (DURAGESIC - DOSED MCG/HR) 50 MCG/HR Place 1 patch (50 mcg total) onto the skin every 3 (three) days. 09/01/17 10/01/17 Yes Delano Metz, MD  fentaNYL (DURAGESIC - DOSED MCG/HR) 50 MCG/HR Place 1 patch (50 mcg total) onto the skin every 3 (three) days. 10/01/17 10/31/17 Yes Delano Metz, MD  furosemide (LASIX) 40 MG tablet Take 40 mg by mouth every other day.  02/16/17  Yes [provider]  LYRICA 300 MG capsule Take 300 mg by mouth 2 (two) times daily. 04/12/17  Yes [provider]  mupirocin ointment (BACTROBAN) 2 % Place 1 application into the nose 2 (two) times daily. 06/03/17  Yes  Malva Limes, MD  pantoprazole (PROTONIX) 40 MG tablet take 1 tablet by mouth twice a day Patient taking differently: Take 40 mg by mouth once daily 04/17/17  Yes Fisher, Demetrios Isaacs, MD  simvastatin (ZOCOR) 10 MG tablet Take 10 mg by mouth at bedtime. Reported on 12/04/2015   Yes [provider]  SPIRIVA HANDIHALER 18 MCG inhalation capsule inhale the contents of one capsule in the handihaler once daily 11/16/16  Yes Fisher, Demetrios Isaacs, MD  SUMAtriptan (IMITREX) 25 MG tablet Take 25 mg by mouth as needed for migraine. May repeat in 2 hours if headache persists or recurs.   Yes [provider]  traMADol (ULTRAM) 50 MG tablet Take 2 tablets (100 mg total) by mouth every 6 (six) hours as needed for moderate pain or severe pain. 08/02/17 10/31/17 Yes Delano Metz, MD  triamcinolone ointment (KENALOG) 0.5 % Apply to lesions on hands twice a  day as needed 10/24/16  Yes Fisher, Demetrios Isaacs, MD    Allergies Percocet [oxycodone-acetaminophen]; Aspirin; Codeine; Propoxyphene; and Sulfa antibiotics  Family History  Problem Relation Age of Onset  . Cancer Mother   . Arthritis Mother   . Heart disease Mother   . Diabetes Mother        mellitus, type 2  . Heart disease Father   . Diabetes Sister   . Cancer Brother   . Cancer Brother        lung  . Diabetes Brother     Social History Social History  Substance Use Topics  . Smoking status: Current Every Day Smoker    Packs/day: 0.50    Years: 50.00    Types: Cigarettes  . Smokeless tobacco: Never Used     Comment: Previously smoked 2 ppd  . Alcohol use No    Review of Systems  Constitutional: No fever Eyes: No eye pain.  ENT: No neck pain.  Cardiovascular: Positive for chest wall pain.  Respiratory: Positive for shortness of breath. Gastrointestinal: Positive for left abdominal pain.  Genitourinary: Negative for dysuria.  Musculoskeletal: Positive for back pain. Skin: Negative for lacerations Neurological: Negative for headache.   ____________________________________________   PHYSICAL EXAM:  VITAL SIGNS: ED Triage Vitals  Enc Vitals Group     BP 08/04/17 2124 (!) 147/66     Pulse Rate 08/04/17 2124 91     Resp 08/04/17 2124 (!) 23     Temp 08/04/17 2124 98.6 F (37 C)     Temp Source 08/04/17 2124 Oral     SpO2 08/04/17 2121 96 %     Weight 08/04/17 2124 91 lb (41.3 kg)     Height 08/04/17 2124 5' (1.524 m)     Head Circumference --      Peak Flow --      Pain Score 08/04/17 2122 10     Pain Loc --      Pain Edu? --      Excl. in GC? --     Constitutional: Alert and oriented. Uncomfortable appearing, lying on R side.  Eyes: Conjunctivae are normal.  EOMI. PERRLA.  Head: Atraumatic. Nose: No congestion/rhinnorhea. Mouth/Throat: Mucous membranes are slightly dry.  Neck: Normal range of motion.  Cspine nontender.  Cardiovascular: Normal  rate, regular rhythm. Grossly normal heart sounds.  Good peripheral circulation. Respiratory: Increased respiratory effort.  No retractions. Dec breath sounds bilat, but lungs CTAB.  L chest wall and posterior rib tenderness.  Gastrointestinal: Soft with mild left lower abd tenderness.  Genitourinary: Left  flank mild tenderness.  Musculoskeletal: No lower extremity edema.  Extremities warm and well perfused. No midline t-spine tenderness, mild midline Lspine tenderness.  Neurologic:  Normal speech and language. No gross focal neurologic deficits are appreciated. Motor intact in all extremities.  Skin:  Skin is warm and dry. No rash noted. Psychiatric: Mood and affect are normal. Speech and behavior are normal.  ____________________________________________   LABS (all labs ordered are listed, but only abnormal results are displayed)  Labs Reviewed  COMPREHENSIVE METABOLIC PANEL - Abnormal; Notable for the following:       Result Value   Glucose, Bld 113 (*)    BUN 22 (*)    Creatinine, Ser 1.08 (*)    Calcium 8.5 (*)    Albumin 3.4 (*)    ALT 10 (*)    GFR calc non Af Amer 52 (*)    GFR calc Af Amer 60 (*)    All other components within normal limits  CBC WITH DIFFERENTIAL/PLATELET - Abnormal; Notable for the following:    RBC 3.20 (*)    Hemoglobin 8.7 (*)    HCT 26.7 (*)    RDW 16.9 (*)    All other components within normal limits  TROPONIN I   ____________________________________________  EKG  ED ECG REPORT I, Dionne Bucy, the attending physician, personally viewed and interpreted this ECG.  Date: 08/04/2017 EKG Time: 2126 Rate: 92 Rhythm: normal sinus rhythm QRS Axis: normal Intervals: normal ST/T Wave abnormalities: LVH, slight inferior ST depression <13mm, chronic Narrative Interpretation: no evidence of acute ischemia; no change from EKG on 11/08/16  ____________________________________________  RADIOLOGY  CT T and L-spine with no acute fracture. CT  abdomen with no evidence of acute traumatic injury, with small left pleural effusion. Chest and rib x-rays show acute versus subacuteleft lateral ninth rib fracture ____________________________________________   PROCEDURES  Procedure(s) performed: No    Critical Care performed: No ____________________________________________   INITIAL IMPRESSION / ASSESSMENT AND PLAN / ED COURSE  Pertinent labs & imaging results that were available during my care of the patient were reviewed by me and considered in my medical decision making (see chart for details).  66 year old female presents with left posterior rib, flank, and abdominal pain after multiple falls today. Vital signs are normal, patient is uncomfortable but not acutely ill-appearing exam is as described with tenderness in the left lower posterior ribs, and into the left flank and left lower quadrant. There is mild L-spine tenderness as well but no step-off or crepitus. Patient does not specifically remember how she fell but denies daily or near syncope. Plan: Chest x-ray and left rib series, CT abdomen as well as CT T and L-spine to rule out acute injuries. No indication for CT head or C-spine.  Plan for basic labs, troponin, and UA to rule out acute infection, electrolyte abnormality, anemia, cardiac ischemia or other acute precipitating factor.  If no traumatic injury, pt will still likely require admission for pain control.   Clinical Course as of Aug 05 4  Mon Aug 04, 2017  2335 Hgb 8.7 is stable from pt's baseline.    [SS]  2336 Ca 8.5 also stable from pt's baseline.    [SS]    Clinical Course User Index [SS] Dionne Bucy, MD   ----------------------------------------- 12:01 AM on 08/05/2017 -----------------------------------------  Patient's pain is significantly improved and she is now resting comfortably. She is having no difficulty breathing at this time. Lab workup is unremarkable except for chronic findings of  anemia  and low Ca, unchanged from baseline.  Imaging reveals acute versus subacute left ninth rib fracture, and small pleural effusion but no other significant findings.  Pleural effusion is likely not clinically significant given that it may be subacute and pt has no resp sx.  At this time given single isolated rib fracture, well-controlled pain, no respiratory distress, and O2 sat 100% there is no indication for further ED observation or admission. Patient expresses strong preference to go home. Her family members are not the bedside and feel comfortable taking her home.  I counseled patient on the possibility of atelectasis and pneumonia and importance of pain control and taking deep breaths.  I gave return precautions to pt and family members.   ____________________________________________   FINAL CLINICAL IMPRESSION(S) / ED DIAGNOSES  Final diagnoses:  Closed fracture of one rib of left side, initial encounter      NEW MEDICATIONS STARTED DURING THIS VISIT:  New Prescriptions   No medications on file     Note:  This document was prepared using Dragon voice recognition software and may include unintentional dictation errors.    Dionne BucySiadecki, Jeanean Hollett, MD 08/05/17 0005

## 2017-08-05 NOTE — Discharge Instructions (Signed)
Return to the ER for new or worsening pain, difficulty breathing, coughing, fever, weakness, or any other new or worsening symptoms that concern you.  Take your Tramadol and/or Tylenol for pain, and make sure to take deep, full breaths several times an hour to keep your lungs open.   Follow up with your primary care doctor.

## 2017-08-06 DIAGNOSIS — J9611 Chronic respiratory failure with hypoxia: Secondary | ICD-10-CM | POA: Diagnosis not present

## 2017-08-06 DIAGNOSIS — I739 Peripheral vascular disease, unspecified: Secondary | ICD-10-CM | POA: Diagnosis not present

## 2017-08-06 DIAGNOSIS — I10 Essential (primary) hypertension: Secondary | ICD-10-CM | POA: Diagnosis not present

## 2017-08-06 DIAGNOSIS — J441 Chronic obstructive pulmonary disease with (acute) exacerbation: Secondary | ICD-10-CM | POA: Diagnosis not present

## 2017-08-06 DIAGNOSIS — I2510 Atherosclerotic heart disease of native coronary artery without angina pectoris: Secondary | ICD-10-CM | POA: Diagnosis not present

## 2017-08-06 DIAGNOSIS — G629 Polyneuropathy, unspecified: Secondary | ICD-10-CM | POA: Diagnosis not present

## 2017-08-06 DIAGNOSIS — M810 Age-related osteoporosis without current pathological fracture: Secondary | ICD-10-CM | POA: Diagnosis not present

## 2017-08-06 DIAGNOSIS — K219 Gastro-esophageal reflux disease without esophagitis: Secondary | ICD-10-CM | POA: Diagnosis not present

## 2017-08-06 DIAGNOSIS — G8929 Other chronic pain: Secondary | ICD-10-CM | POA: Diagnosis not present

## 2017-08-06 NOTE — Telephone Encounter (Signed)
Patient called in asking about stopping Plavix, states she has appt for RF procedure on 08/12/17 and will stop plavix today 08/06/17 which will make procedure day # 7 of cessation.

## 2017-08-07 ENCOUNTER — Encounter: Payer: Self-pay | Admitting: Family Medicine

## 2017-08-07 ENCOUNTER — Ambulatory Visit (INDEPENDENT_AMBULATORY_CARE_PROVIDER_SITE_OTHER): Payer: Medicare HMO | Admitting: Family Medicine

## 2017-08-07 VITALS — BP 144/78 | HR 72 | Temp 98.0°F | Resp 16 | Ht 61.0 in | Wt 92.0 lb

## 2017-08-07 DIAGNOSIS — J9611 Chronic respiratory failure with hypoxia: Secondary | ICD-10-CM | POA: Diagnosis not present

## 2017-08-07 DIAGNOSIS — I2510 Atherosclerotic heart disease of native coronary artery without angina pectoris: Secondary | ICD-10-CM | POA: Diagnosis not present

## 2017-08-07 DIAGNOSIS — J441 Chronic obstructive pulmonary disease with (acute) exacerbation: Secondary | ICD-10-CM | POA: Diagnosis not present

## 2017-08-07 DIAGNOSIS — L98491 Non-pressure chronic ulcer of skin of other sites limited to breakdown of skin: Secondary | ICD-10-CM | POA: Diagnosis not present

## 2017-08-07 DIAGNOSIS — S2232XA Fracture of one rib, left side, initial encounter for closed fracture: Secondary | ICD-10-CM | POA: Diagnosis not present

## 2017-08-07 DIAGNOSIS — I10 Essential (primary) hypertension: Secondary | ICD-10-CM | POA: Diagnosis not present

## 2017-08-07 NOTE — Progress Notes (Signed)
Patient: Cheryl [name] Female    DOB: 06/20/1951   66 y.o.   MRN: [id number] Visit Date: 08/07/2017  Today's Provider: Mila Merry, MD   Chief Complaint  Patient presents with  . Hospitalization Follow-up  . Wound Check   Subjective:    HPI  Follow Up ER Visit  Patient is here for ER follow up.  She was recently seen at [facility] for a fall and left rib fracture on 08/04/2017. Treatment for this included rest and pain management. She reports good compliance with treatment. She reports this condition is Improved. Still sore, but no difficulty breathing.    Skin ulcer, follow up: Patient was seen in the office on 07/04/2017 for a skin ulcer. She was advised to continue bactroban ointment daily, and daily dressing changes. States lesions are healing well.     Allergies  Allergen Reactions  . Percocet [Oxycodone-Acetaminophen] Hives and Rash  . Aspirin Nausea And Vomiting and Other (See Comments)    Reaction:  GI upset   . Codeine Nausea And Vomiting, Other (See Comments) and Nausea Only    Reaction:  GI upset   . Propoxyphene Other (See Comments) and Nausea Only    GI upset Reaction:  GI upset   . Sulfa Antibiotics Rash and Other (See Comments)    Reaction:  GI upset      Current Outpatient Prescriptions:  .  acetaminophen (TYLENOL) 325 MG tablet, Take 2 tablets (650 mg total) by mouth every 6 (six) hours as needed for mild pain (or Fever >/= 101)., Disp: 30 tablet, Rfl: 0 .  ADVAIR DISKUS 250-50 MCG/DOSE AEPB, inhale 1 dose by mouth twice a day, Disp: 60 each, Rfl: 11 .  albuterol (PROVENTIL HFA;VENTOLIN HFA) 108 (90 BASE) MCG/ACT inhaler, Inhale 2 puffs into the lungs every 4 (four) hours as needed for wheezing or shortness of breath., Disp: 18 g, Rfl: 3 .  albuterol (PROVENTIL) (2.5 MG/3ML) 0.083% nebulizer solution, Take 3 mLs (2.5 mg total) by nebulization every 4 (four) hours as needed for wheezing., Disp: 75 mL, Rfl: 12 .  alendronate (FOSAMAX) 70 MG  tablet, Take 1 tablet (70 mg total) by mouth every 7 (seven) days. Take with a full glass of water on an empty stomach., Disp: 4 tablet, Rfl: 11 .  ALPRAZolam (XANAX) 0.5 MG tablet, Take 1 tablet (0.5 mg total) by mouth 3 (three) times daily as needed for anxiety., Disp: 60 tablet, Rfl: 3 .  celecoxib (CELEBREX) 100 MG capsule, Take 1 capsule (100 mg total) by mouth 2 (two) times daily as needed., Disp: 60 capsule, Rfl: 1 .  clopidogrel (PLAVIX) 75 MG tablet, Take 75 mg by mouth daily., Disp: , Rfl:  .  diphenoxylate-atropine (LOMOTIL) 2.5-0.025 MG per tablet, Take 2 tablets by mouth 4 (four) times daily as needed for diarrhea or loose stools. Reported on 03/11/2016, Disp: , Rfl:  .  donepezil (ARICEPT) 10 MG tablet, Take 10 mg by mouth at bedtime., Disp: , Rfl:  .  estropipate (OGEN) 0.75 MG tablet, Take 0.5 tablets (0.375 mg total) by mouth every other day., Disp: 30 tablet, Rfl: 3 .  fentaNYL (DURAGESIC - DOSED MCG/HR) 50 MCG/HR, Place 1 patch (50 mcg total) onto the skin every 3 (three) days., Disp: 10 patch, Rfl: 0 .  [START ON 09/01/2017] fentaNYL (DURAGESIC - DOSED MCG/HR) 50 MCG/HR, Place 1 patch (50 mcg total) onto the skin every 3 (three) days., Disp: 10 patch, Rfl: 0 .  LYRICA 300  MG capsule, Take 300 mg by mouth 2 (two) times daily., Disp: , Rfl: 0 .  mupirocin ointment (BACTROBAN) 2 %, Place 1 application into the nose 2 (two) times daily., Disp: 30 g, Rfl: 2 .  pantoprazole (PROTONIX) 40 MG tablet, take 1 tablet by mouth twice a day (Patient taking differently: Take 40 mg by mouth once daily), Disp: 60 tablet, Rfl: 12 .  simvastatin (ZOCOR) 10 MG tablet, Take 10 mg by mouth at bedtime. Reported on 12/04/2015, Disp: , Rfl:  .  SPIRIVA HANDIHALER 18 MCG inhalation capsule, inhale the contents of one capsule in the handihaler once daily, Disp: 30 capsule, Rfl: 12 .  triamcinolone ointment (KENALOG) 0.5 %, Apply to lesions on hands twice a day as needed, Disp: 30 g, Rfl: 1 .  [START ON  10/01/2017] fentaNYL (DURAGESIC - DOSED MCG/HR) 50 MCG/HR, Place 1 patch (50 mcg total) onto the skin every 3 (three) days., Disp: 10 patch, Rfl: 0 .  furosemide (LASIX) 40 MG tablet, Take 40 mg by mouth every other day. , Disp: , Rfl:  .  SUMAtriptan (IMITREX) 25 MG tablet, Take 25 mg by mouth as needed for migraine. May repeat in 2 hours if headache persists or recurs., Disp: , Rfl:  .  traMADol (ULTRAM) 50 MG tablet, Take 2 tablets (100 mg total) by mouth every 6 (six) hours as needed for moderate pain or severe pain. (Patient not taking: Reported on 08/07/2017), Disp: 240 tablet, Rfl: 2  Review of Systems  Constitutional: Negative.   Respiratory: Positive for shortness of breath.   Cardiovascular: Negative for chest pain, palpitations and leg swelling.  Musculoskeletal: Positive for arthralgias and myalgias.  Skin: Negative for color change, pallor, rash and wound.  Neurological: Negative.     Social History  Substance Use Topics  . Smoking status: Current Every Day Smoker    Packs/day: 0.50    Years: 50.00    Types: Cigarettes  . Smokeless tobacco: Never Used     Comment: Previously smoked 2 ppd  . Alcohol use No   Objective:   BP (!) 144/78 (BP Location: Right Arm, Patient Position: Sitting, Cuff Size: Normal)   Pulse 72   Temp 98 F (36.7 C)   Resp 16   Ht 5\' 1"  (1.549 m)   Wt 92 lb (41.7 kg)   BMI 17.38 kg/m  Vitals:   08/07/17 1105  BP: (!) 144/78  Pulse: 72  Resp: 16  Temp: 98 F (36.7 C)  Weight: 92 lb (41.7 kg)  Height: 5\' 1"  (1.549 m)     Physical Exam   General Appearance:    Alert, cooperative, no distress  Eyes:    PERRL, conjunctiva/corneas clear, EOM's intact       Lungs:     Clear to auscultation bilaterally, respirations unlabored  Skin:   Several scabbed lesions on both legs with no discharge and no surrounding erythema.        Assessment & Plan:     1. Closed fracture of one rib of left side, initial encounter Repeat xr in 6-7 days to  ensure proper healing.  - DG Ribs Unilateral Left; Future  2. Skin ulcer, limited to breakdown of skin (HCC) No sign of active infection. Continue use of mupirocin daily until lesions completely healed.        Mila Merryonald Fisher, MD  [facility]

## 2017-08-07 NOTE — Patient Instructions (Signed)
Go to the [facility] on [address] for left rib Xray around Wednesday September 19th, 2018

## 2017-08-08 DIAGNOSIS — J441 Chronic obstructive pulmonary disease with (acute) exacerbation: Secondary | ICD-10-CM | POA: Diagnosis not present

## 2017-08-08 DIAGNOSIS — K219 Gastro-esophageal reflux disease without esophagitis: Secondary | ICD-10-CM | POA: Diagnosis not present

## 2017-08-08 DIAGNOSIS — I739 Peripheral vascular disease, unspecified: Secondary | ICD-10-CM | POA: Diagnosis not present

## 2017-08-08 DIAGNOSIS — G8929 Other chronic pain: Secondary | ICD-10-CM | POA: Diagnosis not present

## 2017-08-08 DIAGNOSIS — M810 Age-related osteoporosis without current pathological fracture: Secondary | ICD-10-CM | POA: Diagnosis not present

## 2017-08-08 DIAGNOSIS — J9611 Chronic respiratory failure with hypoxia: Secondary | ICD-10-CM | POA: Diagnosis not present

## 2017-08-08 DIAGNOSIS — G629 Polyneuropathy, unspecified: Secondary | ICD-10-CM | POA: Diagnosis not present

## 2017-08-08 DIAGNOSIS — I2510 Atherosclerotic heart disease of native coronary artery without angina pectoris: Secondary | ICD-10-CM | POA: Diagnosis not present

## 2017-08-08 DIAGNOSIS — I10 Essential (primary) hypertension: Secondary | ICD-10-CM | POA: Diagnosis not present

## 2017-08-11 DIAGNOSIS — I2510 Atherosclerotic heart disease of native coronary artery without angina pectoris: Secondary | ICD-10-CM | POA: Diagnosis not present

## 2017-08-11 DIAGNOSIS — M810 Age-related osteoporosis without current pathological fracture: Secondary | ICD-10-CM | POA: Diagnosis not present

## 2017-08-11 DIAGNOSIS — K219 Gastro-esophageal reflux disease without esophagitis: Secondary | ICD-10-CM | POA: Diagnosis not present

## 2017-08-11 DIAGNOSIS — G8929 Other chronic pain: Secondary | ICD-10-CM | POA: Diagnosis not present

## 2017-08-11 DIAGNOSIS — I739 Peripheral vascular disease, unspecified: Secondary | ICD-10-CM | POA: Diagnosis not present

## 2017-08-11 DIAGNOSIS — J9611 Chronic respiratory failure with hypoxia: Secondary | ICD-10-CM | POA: Diagnosis not present

## 2017-08-11 DIAGNOSIS — I10 Essential (primary) hypertension: Secondary | ICD-10-CM | POA: Diagnosis not present

## 2017-08-11 DIAGNOSIS — G629 Polyneuropathy, unspecified: Secondary | ICD-10-CM | POA: Diagnosis not present

## 2017-08-11 DIAGNOSIS — J441 Chronic obstructive pulmonary disease with (acute) exacerbation: Secondary | ICD-10-CM | POA: Diagnosis not present

## 2017-08-12 ENCOUNTER — Encounter: Payer: Self-pay | Admitting: Pain Medicine

## 2017-08-12 ENCOUNTER — Ambulatory Visit (HOSPITAL_BASED_OUTPATIENT_CLINIC_OR_DEPARTMENT_OTHER): Payer: Medicare HMO | Admitting: Pain Medicine

## 2017-08-12 ENCOUNTER — Ambulatory Visit
Admission: RE | Admit: 2017-08-12 | Discharge: 2017-08-12 | Disposition: A | Discharge status: Home / Self Care | Payer: Medicare HMO | Source: Ambulatory Visit | Attending: Pain Medicine | Admitting: Pain Medicine

## 2017-08-12 VITALS — BP 166/83 | HR 76 | Temp 98.2°F | Resp 12 | Ht <= 58 in | Wt 92.0 lb

## 2017-08-12 DIAGNOSIS — G8929 Other chronic pain: Secondary | ICD-10-CM | POA: Diagnosis not present

## 2017-08-12 DIAGNOSIS — M5441 Lumbago with sciatica, right side: Secondary | ICD-10-CM

## 2017-08-12 DIAGNOSIS — M47816 Spondylosis without myelopathy or radiculopathy, lumbar region: Secondary | ICD-10-CM

## 2017-08-12 DIAGNOSIS — M545 Low back pain: Secondary | ICD-10-CM | POA: Diagnosis not present

## 2017-08-12 DIAGNOSIS — M4696 Unspecified inflammatory spondylopathy, lumbar region: Secondary | ICD-10-CM | POA: Diagnosis not present

## 2017-08-12 DIAGNOSIS — M5442 Lumbago with sciatica, left side: Secondary | ICD-10-CM

## 2017-08-12 DIAGNOSIS — G8918 Other acute postprocedural pain: Secondary | ICD-10-CM | POA: Insufficient documentation

## 2017-08-12 MED ORDER — HYDROCODONE-ACETAMINOPHEN 5-325 MG PO TABS: 1.0000 | ORAL_TABLET | Freq: Three times a day (TID) | ORAL | 0 refills | Status: DC | PRN

## 2017-08-12 MED ORDER — MIDAZOLAM HCL 5 MG/5ML IJ SOLN
1.0000 mg | INTRAMUSCULAR | Status: DC | PRN
  Administered 2017-08-12: 1.5 mg via INTRAVENOUS
  Filled 2017-08-12: qty 5

## 2017-08-12 MED ORDER — LIDOCAINE HCL 2 % IJ SOLN
10.0000 mL | Freq: Once | INTRAMUSCULAR | Status: AC
  Administered 2017-08-12: 400 mg

## 2017-08-12 MED ORDER — ROPIVACAINE HCL 2 MG/ML IJ SOLN
9.0000 mL | Freq: Once | INTRAMUSCULAR | Status: AC
Start: 2017-08-12 — End: 2017-08-12
  Administered 2017-08-12: 10 mL via PERINEURAL
  Filled 2017-08-12: qty 10

## 2017-08-12 MED ORDER — FENTANYL CITRATE (PF) 100 MCG/2ML IJ SOLN
25.0000 ug | INTRAMUSCULAR | Status: DC | PRN
  Administered 2017-08-12: 75 ug via INTRAVENOUS
  Filled 2017-08-12: qty 2

## 2017-08-12 MED ORDER — LACTATED RINGERS IV SOLN
1000.0000 mL | Freq: Once | INTRAVENOUS | Status: AC
  Administered 2017-08-12: 1000 mL via INTRAVENOUS

## 2017-08-12 MED ORDER — TRIAMCINOLONE ACETONIDE 40 MG/ML IJ SUSP
40.0000 mg | Freq: Once | INTRAMUSCULAR | Status: AC
Start: 2017-08-12 — End: 2017-08-12
  Administered 2017-08-12: 40 mg
  Filled 2017-08-12: qty 1

## 2017-08-12 NOTE — Progress Notes (Signed)
Patient's Name: Cheryl [name] First  MRN: [id number]  Referring Provider: Barbette Merino, NP  DOB: 1951/02/13  PCP: Malva Limes, MD  DOS: 08/12/2017  Note by: Oswaldo Done, MD  Service setting: Ambulatory outpatient  Specialty: Interventional Pain Management  Patient type: Established  Location: [facility] (AMB) Pain Management Facility  Visit type: Interventional Procedure   Primary Reason for Visit: Interventional Pain Management Treatment. CC: Back Pain (lower)  Procedure:  Anesthesia, Analgesia, Anxiolysis:  Type: Therapeutic Medial Branch Facet Radiofrequency Ablation Region: Lumbar Level: L2, L3, L4, L5, & S1 Medial Branch Level(s) Laterality: Right-Sided  Type: Local Anesthesia with Moderate (Conscious) Sedation Local Anesthetic: Lidocaine 1% Route: Intravenous (IV) IV Access: Secured Sedation: Meaningful verbal contact was maintained at all times during the procedure  Indication(s): Analgesia and Anxiety  Indications: 1. Lumbar facet syndrome (Location of Primary Source of Pain) (Bilateral) (L>R)   2. Chronic low back pain (Location of Primary Source of Pain) (Bilateral) (L>R)   3. Lumbar spondylosis   4. Acute postoperative pain    Cheryl [name] has either failed to respond, was unable to tolerate, or simply did not get enough benefit from other more conservative therapies including, but not limited to: 1. Over-the-counter medications 2. Anti-inflammatory medications 3. Muscle relaxants 4. Membrane stabilizers 5. Opioids 6. Physical therapy 7. Modalities (Heat, ice, etc.) 8. Invasive techniques such as nerve blocks. Cheryl [name] has attained more than 50% relief of the pain from a series of diagnostic injections conducted in separate occasions.  Pain Score: Pre-procedure: 3 /10 Post-procedure: 0-No pain/10  Pre-op Assessment:  Cheryl [name] is a 66 y.o. (year old), female patient, seen today for interventional treatment. She  has a past surgical history that includes  Appendectomy; Spine surgery; Foot surgery (Bilateral); cardiac catherization (10/31/2009); abdomnal aortic stent (05/30/2008); Abdominal hysterectomy (1975); Cholecystectomy (1972); Cervical fusion (C5 - 6/C6-7); Appendectomy; Colonoscopy with propofol (N/A, 07/27/2015); and Esophagogastroduodenoscopy (egd) with propofol (N/A, 07/27/2015). Cheryl [name] has a current medication list which includes the following prescription(s): acetaminophen, advair diskus, albuterol, albuterol, alendronate, alprazolam, celecoxib, clopidogrel, diphenoxylate-atropine, donepezil, estropipate, fentanyl, fentanyl, fentanyl, furosemide, lyrica, mupirocin ointment, pantoprazole, simvastatin, spiriva handihaler, sumatriptan, tramadol, triamcinolone ointment, and hydrocodone-acetaminophen, and the following Facility-Administered Medications: fentanyl and midazolam. Her primarily concern today is the Back Pain (lower)  Initial Vital Signs: There were no vitals taken for this visit. BMI: Estimated body mass index is 19.23 kg/m as calculated from the following:   Height as of this encounter:  (1.473 m).   Weight as of this encounter: 92 lb (41.7 kg).  Risk Assessment: Allergies: Reviewed. She is allergic to percocet [oxycodone-acetaminophen]; aspirin; codeine; propoxyphene; and sulfa antibiotics.  Allergy Precautions: None required Coagulopathies: Reviewed. None identified.  Blood-thinner therapy: None at this time Active Infection(s): Reviewed. None identified. Cheryl [name] is afebrile  Site Confirmation: Cheryl [name] was asked to confirm the procedure and laterality before marking the site Procedure checklist: Completed Consent: Before the procedure and under the influence of no sedative(s), amnesic(s), or anxiolytics, the patient was informed of the treatment options, risks and possible complications. To fulfill our ethical and legal obligations, as recommended by the American Medical Association's Code of Ethics, I have informed  the patient of my clinical impression; the nature and purpose of the treatment or procedure; the risks, benefits, and possible complications of the intervention; the alternatives, including doing nothing; the risk(s) and benefit(s) of the alternative treatment(s) or procedure(s); and the risk(s) and benefit(s) of doing nothing. The patient was provided information about the  general risks and possible complications associated with the procedure. These may include, but are not limited to: failure to achieve desired goals, infection, bleeding, organ or nerve damage, allergic reactions, paralysis, and death. In addition, the patient was informed of those risks and complications associated to Spine-related procedures, such as failure to decrease pain; infection (i.e.: Meningitis, epidural or intraspinal abscess); bleeding (i.e.: epidural hematoma, subarachnoid hemorrhage, or any other type of intraspinal or peri-dural bleeding); organ or nerve damage (i.e.: Any type of peripheral nerve, nerve root, or spinal cord injury) with subsequent damage to sensory, motor, and/or autonomic systems, resulting in permanent pain, numbness, and/or weakness of one or several areas of the body; allergic reactions; (i.e.: anaphylactic reaction); and/or death. Furthermore, the patient was informed of those risks and complications associated with the medications. These include, but are not limited to: allergic reactions (i.e.: anaphylactic or anaphylactoid reaction(s)); adrenal axis suppression; blood sugar elevation that in diabetics may result in ketoacidosis or comma; water retention that in patients with history of congestive heart failure may result in shortness of breath, pulmonary edema, and decompensation with resultant heart failure; weight gain; swelling or edema; medication-induced neural toxicity; particulate matter embolism and blood vessel occlusion with resultant organ, and/or nervous system infarction; and/or aseptic  necrosis of one or more joints. Finally, the patient was informed that Medicine is not an exact science; therefore, there is also the possibility of unforeseen or unpredictable risks and/or possible complications that may result in a catastrophic outcome. The patient indicated having understood very clearly. We have given the patient no guarantees and we have made no promises. Enough time was given to the patient to ask questions, all of which were answered to the patient's satisfaction. Cheryl [name] has indicated that she wanted to continue with the procedure. Attestation: I, the ordering provider, attest that I have discussed with the patient the benefits, risks, side-effects, alternatives, likelihood of achieving goals, and potential problems during recovery for the procedure that I have provided informed consent. Date: 08/12/2017; Time: 1:49 PM  Pre-Procedure Preparation:  Monitoring: As per clinic protocol. Respiration, ETCO2, SpO2, BP, heart rate and rhythm monitor placed and checked for adequate function Safety Precautions: Patient was assessed for positional comfort and pressure points before starting the procedure. Time-out: I initiated and conducted the "Time-out" before starting the procedure, as per protocol. The patient was asked to participate by confirming the accuracy of the "Time Out" information. Verification of the correct person, site, and procedure were performed and confirmed by me, the nursing staff, and the patient. "Time-out" conducted as per Joint Commission's Universal Protocol (UP.01.01.01). "Time-out" Date & Time: 08/12/2017; 1523 hrs.  Description of Procedure Process:   Position: Prone Target Area: For Lumbar Facet blocks, the target is the groove formed by the junction of the transverse process and superior articular process. For the L5 dorsal ramus, the target is the notch between superior articular process and sacral ala. For the S1 dorsal ramus, the target is the superior  and lateral edge of the posterior S1 Sacral foramen. Approach: Paraspinal approach. Area Prepped: Entire Posterior Lumbosacral Region Prepping solution: Hibiclens (4.0% Chlorhexidine gluconate solution) Safety Precautions: Aspiration looking for blood return was conducted prior to all injections. At no point did we inject any substances, as a needle was being advanced. No attempts were made at seeking any paresthesias. Safe injection practices and needle disposal techniques used. Medications properly checked for expiration dates. SDV (single dose vial) medications used. Description of the Procedure: Protocol guidelines were followed. The  patient was placed in position over the fluoroscopy table. The target area was identified and the area prepped in the usual manner. Skin desensitized using vapocoolant spray. Skin & deeper tissues infiltrated with local anesthetic. Appropriate amount of time allowed to pass for local anesthetics to take effect. Radiofrequency needles were introduced to the area of the medial branch at the junction of the superior articular process and transverse process using fluoroscopy. Using the [employer], sensory stimulation using 50 Hz was used to locate & identify the nerve, making sure that the needle was positioned such that there was no sensory stimulation below 0.3 V or above 0.7 V. Stimulation using 2 Hz was used to evaluate the motor component. Care was taken not to lesion any nerves that demonstrated motor stimulation of the lower extremities at an output of less than 2.5 times that of the sensory threshold, or a maximum of 2.0 V. Once satisfactory placement of the needles was achieved, the above solution was slowly injected after negative aspiration. After waiting for at least 2 minutes, the ablation was performed at 80 degrees C for 60 seconds.The needles were then removed and the area cleansed, making sure to leave some of the prepping solution back to  take advantage of its long term bactericidal properties. Intra-operative Compliance: Compliant  Illustration of the posterior view of the lumbar spine and the posterior neural structures. Laminae of L2 through S1 are labeled. DPRL5, dorsal primary ramus of L5; DPRS1, dorsal primary ramus of S1; DPR3, dorsal primary ramus of L3; FJ, facet (zygapophyseal) joint L3-L4; I, inferior articular process of L4; LB1, lateral branch of dorsal primary ramus of L1; IAB, inferior articular branches from L3 medial branch (supplies L4-L5 facet joint); IBP, intermediate branch plexus; MB3, medial branch of dorsal primary ramus of L3; NR3, third lumbar nerve root; S, superior articular process of L5; SAB, superior articular branches from L4 (supplies L4-5 facet joint also); TP3, transverse process of L3.  Vitals:   08/12/17 1617 08/12/17 1627 08/12/17 1637 08/12/17 1645  BP: (!) 163/79 (!) 170/65 (!) 158/77 (!) 166/83  Pulse:      Resp: Temp:      TempSrc:      SpO2: 100% 95% 97% 97%  Weight:      Height:        Start Time: 1524 hrs. End Time: 1617 hrs. Materials & Medications:  Needle(s) Type: Teflon-coated, curved tip, Radiofrequency needle(s) Gauge: 22G Length: 10cm Medication(s): We administered lactated ringers, midazolam, fentaNYL, lidocaine, triamcinolone acetonide, and ropivacaine (PF) 2 mg/mL (0.2%). Please see chart orders for dosing details.  Imaging Guidance (Spinal):  Type of Imaging Technique: Fluoroscopy Guidance (Spinal) Indication(s): Assistance in needle guidance and placement for procedures requiring needle placement in or near specific anatomical locations not easily accessible without such assistance. Exposure Time: Please see nurses notes. Contrast: None used. Fluoroscopic Guidance: I was personally present during the use of fluoroscopy. "Tunnel Vision Technique" used to obtain the best possible view of the target area. Parallax error corrected before commencing the  procedure. "Direction-depth-direction" technique used to introduce the needle under continuous pulsed fluoroscopy. Once target was reached, antero-posterior, oblique, and lateral fluoroscopic projection used confirm needle placement in all planes. Images permanently stored in EMR. Interpretation: No contrast injected. I personally interpreted the imaging intraoperatively. Adequate needle placement confirmed in multiple planes. Permanent images saved into the patient's record.  Antibiotic Prophylaxis:  Indication(s): None identified Antibiotic given: None  Post-operative Assessment:  EBL: None Complications:  No immediate post-treatment complications observed by team, or reported by patient. Note: The patient tolerated the entire procedure well. A repeat set of vitals were taken after the procedure and the patient was kept under observation following institutional policy, for this type of procedure. Post-procedural neurological assessment was performed, showing return to baseline, prior to discharge. The patient was provided with post-procedure discharge instructions, including a section on how to identify potential problems. Should any problems arise concerning this procedure, the patient was given instructions to immediately contact us, at any time, without hesitation. In any case, we plan to contact the patient by telephone for a follow-up status report regarding this interventional procedure. Comments:  No additional relevant information.  Plan of Care  Disposition: Discharge home  Discharge Date & Time: 08/12/2017; 1650 hrs.   Physician-requested Follow-up:  Return for Post-RFA eval by Crystal in 6 weeks.  Future Appointments Date Time Provider Department Center  09/23/2017 10:45 AM Barbette Merino, NP [facility] None  10/07/2017 11:00 AM Barbette Merino, NP [facility] None    Imaging Orders     DG C-Arm 1-60 Min-No Report Procedure Orders    No procedure(s) ordered today     Medications ordered for procedure: Meds ordered this encounter  Medications  . lactated ringers infusion 1,000 mL  . midazolam (VERSED) 5 MG/5ML injection 1-2 mg    Make sure Flumazenil is available in the pyxis when using this medication. If oversedation occurs, administer 0.2 mg IV over 15 sec. If after 45 sec no response, administer 0.2 mg again over 1 min; may repeat at 1 min intervals; not to exceed 4 doses (1 mg)  . fentaNYL (SUBLIMAZE) injection 25-50 mcg    Make sure Narcan is available in the pyxis when using this medication. In the event of respiratory depression (RR< 8/min): Titrate NARCAN (naloxone) in increments of 0.1 to 0.2 mg IV at 2-3 minute intervals, until desired degree of reversal.  . lidocaine (XYLOCAINE) 2 % (with pres) injection 200 mg  . triamcinolone acetonide (KENALOG-40) injection 40 mg  . ropivacaine (PF) 2 mg/mL (0.2%) (NAROPIN) injection 9 mL  . HYDROcodone-acetaminophen (NORCO/VICODIN) 5-325 MG tablet    Sig: Take 1 tablet by mouth every 8 (eight) hours as needed for severe pain.    Dispense:  21 tablet    Refill:  0    For acute post-operative pain. Not to be refilled. To last 7 days.   Medications administered: We administered lactated ringers, midazolam, fentaNYL, lidocaine, triamcinolone acetonide, and ropivacaine (PF) 2 mg/mL (0.2%).  See the medical record for exact dosing, route, and time of administration.  New Prescriptions   HYDROCODONE-ACETAMINOPHEN (NORCO/VICODIN) 5-325 MG TABLET    Take 1 tablet by mouth every 8 (eight) hours as needed for severe pain.   Primary Care Physician: Malva Limes, MD Location: [facility] Note by: Oswaldo Done, MD Date: 08/12/2017; Time: 6:24 PM  Disclaimer:  Medicine is not an Visual merchandiser. The only guarantee in medicine is that nothing is guaranteed. It is important to note that the decision to proceed with this intervention was based on the information collected  from the patient. The Data and conclusions were drawn from the patient's questionnaire, the interview, and the physical examination. Because the information was provided in large part by the patient, it cannot be guaranteed that it has not been purposely or unconsciously manipulated. Every effort has been made to obtain as much relevant data as possible for this evaluation. It is  important to note that the conclusions that lead to this procedure are derived in large part from the available data. Always take into account that the treatment will also be dependent on availability of resources and existing treatment guidelines, considered by other Pain Management Practitioners as being common knowledge and practice, at the time of the intervention. For Medico-Legal purposes, it is also important to point out that variation in procedural techniques and pharmacological choices are the acceptable norm. The indications, contraindications, technique, and results of the above procedure should only be interpreted and judged by a Board-Certified Interventional Pain Specialist with extensive familiarity and expertise in the same exact procedure and technique.

## 2017-08-12 NOTE — Patient Instructions (Addendum)
____________________________________________________________________________________________  Post-Procedure instructions Instructions:  Apply ice: Fill a plastic sandwich bag with crushed ice. Cover it with a small towel and apply to injection site. Apply for 15 minutes then remove x 15 minutes. Repeat sequence on day of procedure, until you go to bed. The purpose is to minimize swelling and discomfort after procedure.  Apply heat: Apply heat to procedure site starting the day following the procedure. The purpose is to treat any soreness and discomfort from the procedure.  Food intake: Start with clear liquids (like water) and advance to regular food, as tolerated.   Physical activities: Keep activities to a minimum for the first 8 hours after the procedure.   Driving: If you have received any sedation, you are not allowed to drive for 24 hours after your procedure.  Blood thinner: Restart your blood thinner 6 hours after your procedure. (Only for those taking blood thinners)  Insulin: As soon as you can eat, you may resume your normal dosing schedule. (Only for those taking insulin)  Infection prevention: Keep procedure site clean and dry.  Post-procedure Pain Diary: Extremely important that this be done correctly and accurately. Recorded information will be used to determine the next step in treatment.  Pain evaluated is that of treated area only. Do not include pain from an untreated area.  Complete every hour, on the hour, for the initial 8 hours. Set an alarm to help you do this part accurately.  Do not go to sleep and have it completed later. It will not be accurate.  Follow-up appointment: Keep your follow-up appointment after the procedure. Usually 2 weeks for most procedures. (6 weeks in the case of radiofrequency.) Bring you pain diary.  Expect:  From numbing medicine (AKA: Local Anesthetics): Numbness or decrease in pain.  Onset: Full effect within 15 minutes of  injected.  Duration: It will depend on the type of local anesthetic used. On the average, 1 to 8 hours.   From steroids: Decrease in swelling or inflammation. Once inflammation is improved, relief of the pain will follow.  Onset of benefits: Depends on the amount of swelling present. The more swelling, the longer it will take for the benefits to be seen. In some cases, up to 10 days.  Duration: Steroids will stay in the system x 2 weeks. Duration of benefits will depend on multiple posibilities including persistent irritating factors.  From procedure: Some discomfort is to be expected once the numbing medicine wears off. This should be minimal if ice and heat are applied as instructed. Call if:  You experience numbness and weakness that gets worse with time, as opposed to wearing off.  New onset bowel or bladder incontinence. (Spinal procedures only)  Emergency Numbers:  Durning business hours (Monday - Thursday, 8:00 AM - 4:00 PM) (Friday, 9:00 AM - 12:00 Noon): [phone]  After hours: [phone] ____________________________________________________________________________________________  Radiofrequency Lesioning Radiofrequency lesioning is a procedure that is performed to relieve pain. The procedure is often used for back, neck, or arm pain. Radiofrequency lesioning involves the use of a machine that creates radio waves to make heat. During the procedure, the heat is applied to the nerve that carries the pain signal. The heat damages the nerve and interferes with the pain signal. Pain relief usually starts about 2 weeks after the procedure and lasts for 6 months to 1 year. Tell a health care provider about:  Any allergies you have.  All medicines you are taking, including vitamins, herbs, eye drops, creams, and  over-the-counter medicines.  Any problems you or family members have had with anesthetic medicines.  Any blood disorders you have.  Any surgeries you have  had.  Any medical conditions you have.  Whether you are pregnant or may be pregnant. What are the risks? Generally, this is a safe procedure. However, problems may occur, including:  Pain or soreness at the injection site.  Infection at the injection site.  Damage to nerves or blood vessels.  What happens before the procedure?  Ask your health care provider about: ? Changing or stopping your regular medicines. This is especially important if you are taking diabetes medicines or blood thinners. ? Taking medicines such as aspirin and ibuprofen. These medicines can thin your blood. Do not take these medicines before your procedure if your health care provider instructs you not to.  Follow instructions from your health care provider about eating or drinking restrictions.  Plan to have someone take you home after the procedure.  If you go home right after the procedure, plan to have someone with you for 24 hours. What happens during the procedure?  You will be given one or more of the following: ? A medicine to help you relax (sedative). ? A medicine to numb the area (local anesthetic).  You will be awake during the procedure. You will need to be able to talk with the health care provider during the procedure.  With the help of a type of X-ray (fluoroscopy), the health care provider will insert a radiofrequency needle into the area to be treated.  Next, a wire that carries the radio waves (electrode) will be put through the radiofrequency needle. An electrical pulse will be sent through the electrode to verify the correct nerve. You will feel a tingling sensation, and you may have muscle twitching.  Then, the tissue that is around the needle tip will be heated by an electric current that is passed using the radiofrequency machine. This will numb the nerves.  A bandage (dressing) will be put on the insertion area after the procedure is done. The procedure may vary among health care  providers and hospitals. What happens after the procedure?  Your blood pressure, heart rate, breathing rate, and blood oxygen level will be monitored often until the medicines you were given have worn off.  Return to your normal activities as directed by your health care provider. This information is not intended to replace advice given to you by your health care provider. Make sure you discuss any questions you have with your health care provider. Document Released: 07/10/2011 Document Revised: 04/18/2016 Document Reviewed: 12/19/2014 Elsevier Interactive Patient Education  2018 [employer] [place]. Pain Management Discharge Instructions  General Discharge Instructions :  If you need to reach your doctor call: Monday-Friday 8:00 am - 4:00 pm at [phone] or toll free [phone].  After clinic hours [phone] to have operator reach doctor.  Bring all of your medication bottles to all your appointments in the pain clinic.  To cancel or reschedule your appointment with Pain Management please remember to call 24 hours in advance to avoid a fee.  Refer to the educational materials which you have been given on: General Risks, I had my Procedure. Discharge Instructions, Post Sedation.  Post Procedure Instructions:  The drugs you were given will stay in your system until tomorrow, so for the next 24 hours you should not drive, make any legal decisions or drink any alcoholic beverages.  You may eat anything you prefer, but it is better  to start with liquids then soups and crackers, and gradually work up to solid foods.  Please notify your doctor immediately if you have any unusual bleeding, trouble breathing or pain that is not related to your normal pain.  Depending on the type of procedure that was done, some parts of your body may feel week and/or numb.  This usually clears up by tonight or the next day.  Walk with the use of an assistive device or accompanied by an adult for the 24  hours.  You may use ice on the affected area for the first 24 hours.  Put ice in a Ziploc bag and cover with a towel and place against area 15 minutes on 15 minutes off.  You may switch to heat after 24 hours.

## 2017-08-12 NOTE — Progress Notes (Signed)
Safety precautions to be maintained throughout the outpatient stay will include: orient to surroundings, keep bed in low position, maintain call bell within reach at all times, provide assistance with transfer out of bed and ambulation.  

## 2017-08-13 ENCOUNTER — Ambulatory Visit
Admission: RE | Admit: 2017-08-13 | Discharge: 2017-08-13 | Disposition: A | Discharge status: Home / Self Care | Payer: Medicare HMO | Source: Ambulatory Visit | Attending: Family Medicine | Admitting: Family Medicine

## 2017-08-13 ENCOUNTER — Telehealth: Payer: Self-pay | Admitting: *Deleted

## 2017-08-13 DIAGNOSIS — S2232XA Fracture of one rib, left side, initial encounter for closed fracture: Secondary | ICD-10-CM

## 2017-08-13 DIAGNOSIS — R0781 Pleurodynia: Secondary | ICD-10-CM | POA: Diagnosis not present

## 2017-08-13 DIAGNOSIS — Z09 Encounter for follow-up examination after completed treatment for conditions other than malignant neoplasm: Secondary | ICD-10-CM | POA: Insufficient documentation

## 2017-08-13 DIAGNOSIS — S299XXA Unspecified injury of thorax, initial encounter: Secondary | ICD-10-CM | POA: Diagnosis not present

## 2017-08-13 NOTE — Telephone Encounter (Signed)
Spoke with patient re; procedure on yesterday, denies any problems or concerns.

## 2017-08-14 DIAGNOSIS — J449 Chronic obstructive pulmonary disease, unspecified: Secondary | ICD-10-CM | POA: Diagnosis not present

## 2017-08-15 DIAGNOSIS — I10 Essential (primary) hypertension: Secondary | ICD-10-CM | POA: Diagnosis not present

## 2017-08-15 DIAGNOSIS — I739 Peripheral vascular disease, unspecified: Secondary | ICD-10-CM | POA: Diagnosis not present

## 2017-08-15 DIAGNOSIS — K219 Gastro-esophageal reflux disease without esophagitis: Secondary | ICD-10-CM | POA: Diagnosis not present

## 2017-08-15 DIAGNOSIS — J9611 Chronic respiratory failure with hypoxia: Secondary | ICD-10-CM | POA: Diagnosis not present

## 2017-08-15 DIAGNOSIS — M810 Age-related osteoporosis without current pathological fracture: Secondary | ICD-10-CM | POA: Diagnosis not present

## 2017-08-15 DIAGNOSIS — G629 Polyneuropathy, unspecified: Secondary | ICD-10-CM | POA: Diagnosis not present

## 2017-08-15 DIAGNOSIS — J441 Chronic obstructive pulmonary disease with (acute) exacerbation: Secondary | ICD-10-CM | POA: Diagnosis not present

## 2017-08-15 DIAGNOSIS — G8929 Other chronic pain: Secondary | ICD-10-CM | POA: Diagnosis not present

## 2017-08-15 DIAGNOSIS — I2510 Atherosclerotic heart disease of native coronary artery without angina pectoris: Secondary | ICD-10-CM | POA: Diagnosis not present

## 2017-08-16 DIAGNOSIS — J449 Chronic obstructive pulmonary disease, unspecified: Secondary | ICD-10-CM | POA: Diagnosis not present

## 2017-08-18 ENCOUNTER — Other Ambulatory Visit: Payer: Self-pay | Admitting: Family Medicine

## 2017-08-18 DIAGNOSIS — J9611 Chronic respiratory failure with hypoxia: Secondary | ICD-10-CM | POA: Diagnosis not present

## 2017-08-18 DIAGNOSIS — G8929 Other chronic pain: Secondary | ICD-10-CM | POA: Diagnosis not present

## 2017-08-18 DIAGNOSIS — K219 Gastro-esophageal reflux disease without esophagitis: Secondary | ICD-10-CM | POA: Diagnosis not present

## 2017-08-18 DIAGNOSIS — I739 Peripheral vascular disease, unspecified: Secondary | ICD-10-CM | POA: Diagnosis not present

## 2017-08-18 DIAGNOSIS — I10 Essential (primary) hypertension: Secondary | ICD-10-CM | POA: Diagnosis not present

## 2017-08-18 DIAGNOSIS — J441 Chronic obstructive pulmonary disease with (acute) exacerbation: Secondary | ICD-10-CM | POA: Diagnosis not present

## 2017-08-18 DIAGNOSIS — M810 Age-related osteoporosis without current pathological fracture: Secondary | ICD-10-CM | POA: Diagnosis not present

## 2017-08-18 DIAGNOSIS — I2510 Atherosclerotic heart disease of native coronary artery without angina pectoris: Secondary | ICD-10-CM | POA: Diagnosis not present

## 2017-08-18 DIAGNOSIS — G629 Polyneuropathy, unspecified: Secondary | ICD-10-CM | POA: Diagnosis not present

## 2017-08-22 DIAGNOSIS — K219 Gastro-esophageal reflux disease without esophagitis: Secondary | ICD-10-CM | POA: Diagnosis not present

## 2017-08-22 DIAGNOSIS — I739 Peripheral vascular disease, unspecified: Secondary | ICD-10-CM | POA: Diagnosis not present

## 2017-08-22 DIAGNOSIS — J441 Chronic obstructive pulmonary disease with (acute) exacerbation: Secondary | ICD-10-CM | POA: Diagnosis not present

## 2017-08-22 DIAGNOSIS — G629 Polyneuropathy, unspecified: Secondary | ICD-10-CM | POA: Diagnosis not present

## 2017-08-22 DIAGNOSIS — M810 Age-related osteoporosis without current pathological fracture: Secondary | ICD-10-CM | POA: Diagnosis not present

## 2017-08-22 DIAGNOSIS — G8929 Other chronic pain: Secondary | ICD-10-CM | POA: Diagnosis not present

## 2017-08-22 DIAGNOSIS — R911 Solitary pulmonary nodule: Secondary | ICD-10-CM | POA: Diagnosis not present

## 2017-08-22 DIAGNOSIS — I2510 Atherosclerotic heart disease of native coronary artery without angina pectoris: Secondary | ICD-10-CM | POA: Diagnosis not present

## 2017-08-22 DIAGNOSIS — I10 Essential (primary) hypertension: Secondary | ICD-10-CM | POA: Diagnosis not present

## 2017-08-22 DIAGNOSIS — J9611 Chronic respiratory failure with hypoxia: Secondary | ICD-10-CM | POA: Diagnosis not present

## 2017-08-25 DIAGNOSIS — K219 Gastro-esophageal reflux disease without esophagitis: Secondary | ICD-10-CM | POA: Diagnosis not present

## 2017-08-25 DIAGNOSIS — J9611 Chronic respiratory failure with hypoxia: Secondary | ICD-10-CM | POA: Diagnosis not present

## 2017-08-25 DIAGNOSIS — G629 Polyneuropathy, unspecified: Secondary | ICD-10-CM | POA: Diagnosis not present

## 2017-08-25 DIAGNOSIS — G8929 Other chronic pain: Secondary | ICD-10-CM | POA: Diagnosis not present

## 2017-08-25 DIAGNOSIS — I739 Peripheral vascular disease, unspecified: Secondary | ICD-10-CM | POA: Diagnosis not present

## 2017-08-25 DIAGNOSIS — J441 Chronic obstructive pulmonary disease with (acute) exacerbation: Secondary | ICD-10-CM | POA: Diagnosis not present

## 2017-08-25 DIAGNOSIS — I2510 Atherosclerotic heart disease of native coronary artery without angina pectoris: Secondary | ICD-10-CM | POA: Diagnosis not present

## 2017-08-25 DIAGNOSIS — I10 Essential (primary) hypertension: Secondary | ICD-10-CM | POA: Diagnosis not present

## 2017-08-25 DIAGNOSIS — M810 Age-related osteoporosis without current pathological fracture: Secondary | ICD-10-CM | POA: Diagnosis not present

## 2017-08-28 DIAGNOSIS — I2510 Atherosclerotic heart disease of native coronary artery without angina pectoris: Secondary | ICD-10-CM | POA: Diagnosis not present

## 2017-08-28 DIAGNOSIS — K219 Gastro-esophageal reflux disease without esophagitis: Secondary | ICD-10-CM | POA: Diagnosis not present

## 2017-08-28 DIAGNOSIS — M810 Age-related osteoporosis without current pathological fracture: Secondary | ICD-10-CM | POA: Diagnosis not present

## 2017-08-28 DIAGNOSIS — J9611 Chronic respiratory failure with hypoxia: Secondary | ICD-10-CM | POA: Diagnosis not present

## 2017-08-28 DIAGNOSIS — G629 Polyneuropathy, unspecified: Secondary | ICD-10-CM | POA: Diagnosis not present

## 2017-08-28 DIAGNOSIS — J441 Chronic obstructive pulmonary disease with (acute) exacerbation: Secondary | ICD-10-CM | POA: Diagnosis not present

## 2017-08-28 DIAGNOSIS — G8929 Other chronic pain: Secondary | ICD-10-CM | POA: Diagnosis not present

## 2017-08-28 DIAGNOSIS — I10 Essential (primary) hypertension: Secondary | ICD-10-CM | POA: Diagnosis not present

## 2017-08-28 DIAGNOSIS — I739 Peripheral vascular disease, unspecified: Secondary | ICD-10-CM | POA: Diagnosis not present

## 2017-08-29 DIAGNOSIS — G8929 Other chronic pain: Secondary | ICD-10-CM | POA: Diagnosis not present

## 2017-08-29 DIAGNOSIS — M810 Age-related osteoporosis without current pathological fracture: Secondary | ICD-10-CM | POA: Diagnosis not present

## 2017-08-29 DIAGNOSIS — I2510 Atherosclerotic heart disease of native coronary artery without angina pectoris: Secondary | ICD-10-CM | POA: Diagnosis not present

## 2017-08-29 DIAGNOSIS — I739 Peripheral vascular disease, unspecified: Secondary | ICD-10-CM | POA: Diagnosis not present

## 2017-08-29 DIAGNOSIS — G629 Polyneuropathy, unspecified: Secondary | ICD-10-CM | POA: Diagnosis not present

## 2017-08-29 DIAGNOSIS — J441 Chronic obstructive pulmonary disease with (acute) exacerbation: Secondary | ICD-10-CM | POA: Diagnosis not present

## 2017-08-29 DIAGNOSIS — I10 Essential (primary) hypertension: Secondary | ICD-10-CM | POA: Diagnosis not present

## 2017-08-29 DIAGNOSIS — J9611 Chronic respiratory failure with hypoxia: Secondary | ICD-10-CM | POA: Diagnosis not present

## 2017-08-29 DIAGNOSIS — K219 Gastro-esophageal reflux disease without esophagitis: Secondary | ICD-10-CM | POA: Diagnosis not present

## 2017-09-01 ENCOUNTER — Encounter: Payer: Medicare HMO | Admitting: Nurse Practitioner

## 2017-09-01 DIAGNOSIS — I10 Essential (primary) hypertension: Secondary | ICD-10-CM | POA: Diagnosis not present

## 2017-09-01 DIAGNOSIS — I739 Peripheral vascular disease, unspecified: Secondary | ICD-10-CM | POA: Diagnosis not present

## 2017-09-01 DIAGNOSIS — J9611 Chronic respiratory failure with hypoxia: Secondary | ICD-10-CM | POA: Diagnosis not present

## 2017-09-01 DIAGNOSIS — I2510 Atherosclerotic heart disease of native coronary artery without angina pectoris: Secondary | ICD-10-CM | POA: Diagnosis not present

## 2017-09-01 DIAGNOSIS — G629 Polyneuropathy, unspecified: Secondary | ICD-10-CM | POA: Diagnosis not present

## 2017-09-01 DIAGNOSIS — G8929 Other chronic pain: Secondary | ICD-10-CM | POA: Diagnosis not present

## 2017-09-01 DIAGNOSIS — J441 Chronic obstructive pulmonary disease with (acute) exacerbation: Secondary | ICD-10-CM | POA: Diagnosis not present

## 2017-09-01 DIAGNOSIS — K219 Gastro-esophageal reflux disease without esophagitis: Secondary | ICD-10-CM | POA: Diagnosis not present

## 2017-09-01 DIAGNOSIS — M810 Age-related osteoporosis without current pathological fracture: Secondary | ICD-10-CM | POA: Diagnosis not present

## 2017-09-04 DIAGNOSIS — K219 Gastro-esophageal reflux disease without esophagitis: Secondary | ICD-10-CM | POA: Diagnosis not present

## 2017-09-04 DIAGNOSIS — J9611 Chronic respiratory failure with hypoxia: Secondary | ICD-10-CM | POA: Diagnosis not present

## 2017-09-04 DIAGNOSIS — M810 Age-related osteoporosis without current pathological fracture: Secondary | ICD-10-CM | POA: Diagnosis not present

## 2017-09-04 DIAGNOSIS — G629 Polyneuropathy, unspecified: Secondary | ICD-10-CM | POA: Diagnosis not present

## 2017-09-04 DIAGNOSIS — I10 Essential (primary) hypertension: Secondary | ICD-10-CM | POA: Diagnosis not present

## 2017-09-04 DIAGNOSIS — I2510 Atherosclerotic heart disease of native coronary artery without angina pectoris: Secondary | ICD-10-CM | POA: Diagnosis not present

## 2017-09-04 DIAGNOSIS — I739 Peripheral vascular disease, unspecified: Secondary | ICD-10-CM | POA: Diagnosis not present

## 2017-09-04 DIAGNOSIS — J441 Chronic obstructive pulmonary disease with (acute) exacerbation: Secondary | ICD-10-CM | POA: Diagnosis not present

## 2017-09-04 DIAGNOSIS — G8929 Other chronic pain: Secondary | ICD-10-CM | POA: Diagnosis not present

## 2017-09-05 DIAGNOSIS — R159 Full incontinence of feces: Secondary | ICD-10-CM | POA: Diagnosis not present

## 2017-09-05 DIAGNOSIS — M6289 Other specified disorders of muscle: Secondary | ICD-10-CM | POA: Diagnosis not present

## 2017-09-08 DIAGNOSIS — K219 Gastro-esophageal reflux disease without esophagitis: Secondary | ICD-10-CM | POA: Diagnosis not present

## 2017-09-08 DIAGNOSIS — I2510 Atherosclerotic heart disease of native coronary artery without angina pectoris: Secondary | ICD-10-CM | POA: Diagnosis not present

## 2017-09-08 DIAGNOSIS — J9611 Chronic respiratory failure with hypoxia: Secondary | ICD-10-CM | POA: Diagnosis not present

## 2017-09-08 DIAGNOSIS — M810 Age-related osteoporosis without current pathological fracture: Secondary | ICD-10-CM | POA: Diagnosis not present

## 2017-09-08 DIAGNOSIS — J441 Chronic obstructive pulmonary disease with (acute) exacerbation: Secondary | ICD-10-CM | POA: Diagnosis not present

## 2017-09-08 DIAGNOSIS — I739 Peripheral vascular disease, unspecified: Secondary | ICD-10-CM | POA: Diagnosis not present

## 2017-09-08 DIAGNOSIS — G629 Polyneuropathy, unspecified: Secondary | ICD-10-CM | POA: Diagnosis not present

## 2017-09-08 DIAGNOSIS — G8929 Other chronic pain: Secondary | ICD-10-CM | POA: Diagnosis not present

## 2017-09-08 DIAGNOSIS — I10 Essential (primary) hypertension: Secondary | ICD-10-CM | POA: Diagnosis not present

## 2017-09-09 ENCOUNTER — Ambulatory Visit: Payer: Medicare HMO | Admitting: Pain Medicine

## 2017-09-12 ENCOUNTER — Encounter: Payer: Self-pay | Admitting: Emergency Medicine

## 2017-09-12 ENCOUNTER — Emergency Department: Payer: Medicare HMO

## 2017-09-12 ENCOUNTER — Emergency Department
Admission: EM | Admit: 2017-09-12 | Discharge: 2017-09-12 | Disposition: A | Discharge status: Home / Self Care | Payer: Medicare HMO | Attending: Emergency Medicine | Admitting: Emergency Medicine

## 2017-09-12 DIAGNOSIS — J449 Chronic obstructive pulmonary disease, unspecified: Secondary | ICD-10-CM | POA: Insufficient documentation

## 2017-09-12 DIAGNOSIS — I5032 Chronic diastolic (congestive) heart failure: Secondary | ICD-10-CM | POA: Insufficient documentation

## 2017-09-12 DIAGNOSIS — A419 Sepsis, unspecified organism: Secondary | ICD-10-CM | POA: Diagnosis not present

## 2017-09-12 DIAGNOSIS — R4182 Altered mental status, unspecified: Secondary | ICD-10-CM

## 2017-09-12 DIAGNOSIS — Z79899 Other long term (current) drug therapy: Secondary | ICD-10-CM | POA: Insufficient documentation

## 2017-09-12 DIAGNOSIS — I2510 Atherosclerotic heart disease of native coronary artery without angina pectoris: Secondary | ICD-10-CM | POA: Diagnosis not present

## 2017-09-12 DIAGNOSIS — J45909 Unspecified asthma, uncomplicated: Secondary | ICD-10-CM | POA: Diagnosis not present

## 2017-09-12 DIAGNOSIS — I110 Hypertensive heart disease with heart failure: Secondary | ICD-10-CM | POA: Insufficient documentation

## 2017-09-12 DIAGNOSIS — G8929 Other chronic pain: Secondary | ICD-10-CM | POA: Insufficient documentation

## 2017-09-12 DIAGNOSIS — F17210 Nicotine dependence, cigarettes, uncomplicated: Secondary | ICD-10-CM | POA: Insufficient documentation

## 2017-09-12 DIAGNOSIS — R509 Fever, unspecified: Secondary | ICD-10-CM

## 2017-09-12 DIAGNOSIS — Z7902 Long term (current) use of antithrombotics/antiplatelets: Secondary | ICD-10-CM | POA: Diagnosis not present

## 2017-09-12 LAB — COMPREHENSIVE METABOLIC PANEL
ALBUMIN: 3.3 g/dL — AB (ref 3.5–5.0)
ALK PHOS: 83 U/L (ref 38–126)
ALT: 10 U/L — ABNORMAL LOW (ref 14–54)
ANION GAP: 8 (ref 5–15)
AST: 17 U/L (ref 15–41)
BUN: 15 mg/dL (ref 6–20)
CALCIUM: 8.2 mg/dL — AB (ref 8.9–10.3)
CHLORIDE: 100 mmol/L — AB (ref 101–111)
CO2: 29 mmol/L (ref 22–32)
Creatinine, Ser: 1.16 mg/dL — ABNORMAL HIGH (ref 0.44–1.00)
GFR calc non Af Amer: 47 mL/min — ABNORMAL LOW (ref >60)
GFR, EST AFRICAN AMERICAN: 55 mL/min — AB (ref >60)
GLUCOSE: 143 mg/dL — AB (ref 65–99)
POTASSIUM: 4 mmol/L (ref 3.5–5.1)
SODIUM: 137 mmol/L (ref 135–145)
Total Bilirubin: 0.7 mg/dL (ref 0.3–1.2)
Total Protein: 6.4 g/dL — ABNORMAL LOW (ref 6.5–8.1)

## 2017-09-12 LAB — URINALYSIS, COMPLETE (UACMP) WITH MICROSCOPIC
Bacteria, UA: NONE SEEN
Bilirubin Urine: NEGATIVE
Glucose, UA: NEGATIVE mg/dL
Hgb urine dipstick: NEGATIVE
Ketones, ur: NEGATIVE mg/dL
LEUKOCYTES UA: NEGATIVE
NITRITE: NEGATIVE
PH: 8 (ref 5.0–8.0)
PROTEIN: NEGATIVE mg/dL
Specific Gravity, Urine: 1.002 — ABNORMAL LOW (ref 1.005–1.030)

## 2017-09-12 LAB — CBC
HEMATOCRIT: 27.6 % — AB (ref 35.0–47.0)
HEMOGLOBIN: 9.1 g/dL — AB (ref 12.0–16.0)
MCH: 27.8 pg (ref 26.0–34.0)
MCHC: 32.8 g/dL (ref 32.0–36.0)
MCV: 84.7 fL (ref 80.0–100.0)
Platelets: 221 10*3/uL (ref 150–440)
RBC: 3.26 MIL/uL — ABNORMAL LOW (ref 3.80–5.20)
RDW: 18.4 % — AB (ref 11.5–14.5)
WBC: 8 10*3/uL (ref 3.6–11.0)

## 2017-09-12 LAB — TROPONIN I

## 2017-09-12 LAB — AMMONIA: Ammonia: <9 umol/L — ABNORMAL LOW (ref 9–35)

## 2017-09-12 LAB — PROTIME-INR
INR: 0.96
PROTHROMBIN TIME: 12.7 s (ref 11.4–15.2)

## 2017-09-12 LAB — LIPASE, BLOOD: Lipase: 16 U/L (ref 11–51)

## 2017-09-12 LAB — LACTIC ACID, PLASMA: LACTIC ACID, VENOUS: 1.6 mmol/L (ref 0.5–1.9)

## 2017-09-12 LAB — PROCALCITONIN

## 2017-09-12 MED ORDER — SODIUM CHLORIDE 0.9 % IV BOLUS (SEPSIS)
1000.0000 mL | Freq: Once | INTRAVENOUS | Status: AC
  Administered 2017-09-12: 1000 mL via INTRAVENOUS

## 2017-09-12 MED ORDER — PIPERACILLIN-TAZOBACTAM 3.375 G IVPB 30 MIN
3.3750 g | Freq: Once | INTRAVENOUS | Status: AC
  Administered 2017-09-12: 3.375 g via INTRAVENOUS
  Filled 2017-09-12: qty 50

## 2017-09-12 MED ORDER — ACETAMINOPHEN 650 MG RE SUPP
650.0000 mg | Freq: Once | RECTAL | Status: AC
  Administered 2017-09-12: 650 mg via RECTAL
  Filled 2017-09-12: qty 1

## 2017-09-12 MED ORDER — LIDOCAINE HCL (PF) 1 % IJ SOLN
5.0000 mL | Freq: Once | INTRAMUSCULAR | Status: DC
  Filled 2017-09-12: qty 5

## 2017-09-12 MED ORDER — VANCOMYCIN HCL IN DEXTROSE 1-5 GM/200ML-% IV SOLN
1000.0000 mg | Freq: Once | INTRAVENOUS | Status: AC
  Administered 2017-09-12: 1000 mg via INTRAVENOUS
  Filled 2017-09-12: qty 200

## 2017-09-12 NOTE — Discharge Instructions (Signed)
PLEASE COME BACK TO OUR ER TOMORROW MORNING AT 7AM FOR A RECHECK  Return to the emergency department sooner for any concerns whatsoever.  I do not know why you had a fever or why you were so confused.  It was a pleasure to take care of you today, and thank you for coming to our emergency department.  If you have any questions or concerns before leaving please ask the nurse to grab me and I'm more than happy to go through your aftercare instructions again.  If you were prescribed any opioid pain medication today such as Norco, Vicodin, Percocet, morphine, hydrocodone, or oxycodone please make sure you do not drive when you are taking this medication as it can alter your ability to drive safely.  If you have any concerns once you are home that you are not improving or are in fact getting worse before you can make it to your follow-up appointment, please do not hesitate to call [phone] and come back for further evaluation.  Merrily BrittleNeil Mariyam Remington, MD  Results for orders placed or performed during the hospital encounter of 09/12/17  CBC  Result Value Ref Range   WBC 8.0 3.6 - 11.0 K/uL   RBC 3.26 (L) 3.80 - 5.20 MIL/uL   Hemoglobin 9.1 (L) 12.0 - 16.0 g/dL   HCT 40.927.6 (L) 81.135.0 - 91.447.0 %   MCV 84.7 80.0 - 100.0 fL   MCH 27.8 26.0 - 34.0 pg   MCHC 32.8 32.0 - 36.0 g/dL   RDW 78.218.4 (H) 95.611.5 - 21.314.5 %   Platelets 221 150 - 440 K/uL  Urinalysis, Complete w Microscopic  Result Value Ref Range   Color, Urine COLORLESS (A) YELLOW   APPearance HAZY (A) CLEAR   Specific Gravity, Urine 1.002 (L) 1.005 - 1.030   pH 8.0 5.0 - 8.0   Glucose, UA NEGATIVE NEGATIVE mg/dL   Hgb urine dipstick NEGATIVE NEGATIVE   Bilirubin Urine NEGATIVE NEGATIVE   Ketones, ur NEGATIVE NEGATIVE mg/dL   Protein, ur NEGATIVE NEGATIVE mg/dL   Nitrite NEGATIVE NEGATIVE   Leukocytes, UA NEGATIVE NEGATIVE   RBC / HPF 6-30 0 - 5 RBC/hpf   WBC, UA 0-5 0 - 5 WBC/hpf   Bacteria, UA NONE SEEN NONE SEEN   Squamous Epithelial / LPF 0-5 (A) NONE  SEEN  Lactic acid, plasma  Result Value Ref Range   Lactic Acid, Venous 1.6 0.5 - 1.9 mmol/L  Comprehensive metabolic panel  Result Value Ref Range   Sodium 137 135 - 145 mmol/L   Potassium 4.0 3.5 - 5.1 mmol/L   Chloride 100 (L) 101 - 111 mmol/L   CO2 29 22 - 32 mmol/L   Glucose, Bld 143 (H) 65 - 99 mg/dL   BUN 15 6 - 20 mg/dL   Creatinine, Ser 0.861.16 (H) 0.44 - 1.00 mg/dL   Calcium 8.2 (L) 8.9 - 10.3 mg/dL   Total Protein 6.4 (L) 6.5 - 8.1 g/dL   Albumin 3.3 (L) 3.5 - 5.0 g/dL   AST 17 15 - 41 U/L   ALT 10 (L) 14 - 54 U/L   Alkaline Phosphatase 83 38 - 126 U/L   Total Bilirubin 0.7 0.3 - 1.2 mg/dL   GFR calc non Af Amer 47 (L) >60 mL/min   GFR calc Af Amer 55 (L) >60 mL/min   Anion gap 8 5 - 15  Lipase, blood  Result Value Ref Range   Lipase 16 11 - 51 U/L  Troponin I  Result Value Ref  Range   Troponin I <0.03 <0.03 ng/mL  Procalcitonin  Result Value Ref Range   Procalcitonin <0.10 ng/mL  Protime-INR  Result Value Ref Range   Prothrombin Time 12.7 11.4 - 15.2 seconds   INR 0.96   Ammonia  Result Value Ref Range   Ammonia <9 (L) 9 - 35 umol/L   Ct Head Wo Contrast  Result Date: 09/12/2017 CLINICAL DATA:  Altered mental status EXAM: CT HEAD WITHOUT CONTRAST TECHNIQUE: Contiguous axial images were obtained from the base of the skull through the vertex without intravenous contrast. COMPARISON:  04/30/2017, 11/05/2016, 10/09/2015 FINDINGS: Brain: No large vessel territorial infarct hemorrhage or mass is seen. Similar appearance of multifocal, mostly subcortical foci of low density. Stable ventricle size. Vascular: No hyperdense vessels.  No unexpected calcification. Skull: No fracture or suspicious lesion. Sinuses/Orbits: No acute finding. Other: None IMPRESSION: 1. No definite CT evidence for acute intracranial abnormality. 2. Similar appearance of white matter hypodensity presumably related to small vessel ischemic change. Electronically Signed   By: Jasmine Pang M.D.   On:  09/12/2017 15:42   Dg Chest Port 1 View  Result Date: 09/12/2017 CLINICAL DATA:  Sepsis. EXAM: PORTABLE CHEST 1 VIEW COMPARISON:  Radiographs of August 04, 2017. FINDINGS: Stable cardiomegaly. Atherosclerosis of thoracic aorta is noted. No pneumothorax or pleural effusion is noted. No acute pulmonary disease is noted. Bony thorax is unremarkable. IMPRESSION: Aortic atherosclerosis.  No acute cardiopulmonary abnormality seen. Electronically Signed   By: Lupita Raider, M.D.   On: 09/12/2017 15:29

## 2017-09-12 NOTE — ED Notes (Signed)
Patient urged to return tomorrow at 7am per MD instruction stated in the discharge instructions.

## 2017-09-12 NOTE — ED Notes (Signed)
Unable to collect blood work and 2nd set of blood cultures; lab called to request someone come and collect the 2nd set of cultures and rest of the blood work; MD notified and verbal orders received to go ahead and start the antibiotics.

## 2017-09-12 NOTE — ED Provider Notes (Signed)
[facility] Emergency Department Provider Note  ____________________________________________   First MD Initiated Contact with Patient 09/12/17 1502     (approximate)  I have reviewed the triage vital signs and the nursing notes.   HISTORY  Chief Complaint Chest Pain and Unresponsive  level V exemption history Limited by the patient's clinical condition  HPI Cheryl [name] is a 66 y.o. female is brought to the emergency department by her husband for sudden onset altered mental status that began several hours prior to arrival. He said she was in her usual state of health until several hours ago at which point she became minimally responsive. At some point she had reported that she had chest pain. The patient is comfortable medical history including COPD, previous episodes of aspiration pneumonia, anxiety, and chronic pain. She does use a fentanyl patch.   Past Medical History:  Diagnosis Date  . Allergy   . Anxiety   . Arthritis   . Aspiration pneumonitis ([place]) 11/24/2015  . Asthma   . Chronic pain   . COPD (chronic obstructive pulmonary disease) ([place])   . Coronary artery disease   . DVT (deep venous thrombosis) ([place])   . GERD (gastroesophageal reflux disease)   . Headache   . Hyperlipidemia   . Hypertension   . Migraines   . Neuropathy 2010  . Osteoporosis   . Oxygen deficiency   . Peripheral vascular disease ([place])   . Pneumonia   . Pneumonia 11/19/2015  . Pneumonia 10/2016  . Vitamin D deficiency     Patient Active Problem List   Diagnosis Date Noted  . Marijuana use 06/05/2017  . Pneumonia 04/29/2017  . Acute postoperative pain 04/14/2017  . Metabolic encephalopathy 68/34/1962  . Hypokalemia 12/23/2016  . Protein-calorie malnutrition, severe 11/06/2016  . Nausea with vomiting   . Lumbar facet syndrome (Location of Primary Source of Pain) (Bilateral) (L>R) 03/20/2016  . Lumbar spondylosis 03/20/2016  . Opiate use (160 MME/Day)  02/28/2016  . Anemia 02/28/2016  . Osteoarthrosis 12/18/2015  . Long term current use of anticoagulant therapy (Plavix) 12/18/2015  . Long term current use of opiate analgesic 12/04/2015  . Fibromyalgia 12/04/2015  . Dysphagia 11/24/2015  . Acute diastolic CHF (congestive heart failure) ([place]) 11/24/2015  . Leukocytosis 11/24/2015  . Abnormal mammogram of right breast 10/11/2015  . Dilated intrahepatic bile duct 10/11/2015  . Lumbar radicular pain (Bilateral) (L>R) (L4) 09/04/2015  . Chronic low back pain (Location of Primary Source of Pain) (Bilateral) (L>R) 09/04/2015  . Encounter for therapeutic drug level monitoring 09/04/2015  . Uncomplicated opioid dependence ([place]) 09/04/2015  . Chronic pain syndrome 09/04/2015  . Platelet inhibition due to Plavix 09/04/2015  . COPD (chronic obstructive pulmonary disease) ([place]) 07/31/2015  . Dementia 07/31/2015  . Airway hyperreactivity 07/03/2015  . Back pain, thoracic 07/03/2015  . Excessive falling 07/03/2015  . Alteration in bowel elimination: incontinence 07/03/2015  . Insomnia 07/03/2015  . Decreased testosterone level 07/03/2015  . Leg weakness 07/03/2015  . Menopausal symptom 07/03/2015  . Migraine 07/03/2015  . Neuropathy ([place]) 07/03/2015  . Fecal occult blood test positive 07/03/2015  . OP (osteoporosis) 07/03/2015  . Panic disorder 07/03/2015  . Compulsive tobacco user syndrome 07/03/2015  . Urinary incontinence 07/03/2015  . Weight loss 07/03/2015  . Essential hypertension 06/06/2015  . GERD (gastroesophageal reflux disease) 06/06/2015  . Hyperlipemia 06/06/2015  . Peripheral nerve disease 04/13/2014  . Anxiety 02/10/2014  . Coronary artery disease 02/10/2014  . Peripheral vascular disease ([place]) 02/10/2014  .  Vitamin D deficiency 10/16/2009    Past Surgical History:  Procedure Laterality Date  . ABDOMINAL HYSTERECTOMY  1975   Bilaterl Oophorectomy; [place] to IUD infection  . abdomnal aortic stent  05/30/2008   Dr.  Quay Burow  . APPENDECTOMY    . APPENDECTOMY    . cardiac catherization  10/31/2009  . CERVICAL FUSION  C5 - 6/C6-7  . CHOLECYSTECTOMY  1972  . COLONOSCOPY WITH PROPOFOL N/A 07/27/2015   Procedure: COLONOSCOPY WITH PROPOFOL;  Surgeon: Hulen Luster, MD;  Location: [facility] ENDOSCOPY;  Service: Gastroenterology;  Laterality: N/A;  . ESOPHAGOGASTRODUODENOSCOPY (EGD) WITH PROPOFOL N/A 07/27/2015   Procedure: ESOPHAGOGASTRODUODENOSCOPY (EGD) WITH PROPOFOL;  Surgeon: Hulen Luster, MD;  Location: [facility] ENDOSCOPY;  Service: Gastroenterology;  Laterality: N/A;  . FOOT SURGERY Bilateral    5-6 years per patient  . SPINE SURGERY      Prior to Admission medications   Medication Sig Start Date End Date Taking? Authorizing Provider  acetaminophen (TYLENOL) 325 MG tablet Take 2 tablets (650 mg total) by mouth every 6 (six) hours as needed for mild pain (or Fever >/= 101). 12/25/16  Yes Gladstone Lighter, MD  ADVAIR DISKUS 250-50 MCG/DOSE AEPB inhale 1 dose by mouth twice a day 08/18/17  Yes Fisher, Kirstie Peri, MD  albuterol (PROVENTIL HFA;VENTOLIN HFA) 108 (90 BASE) MCG/ACT inhaler Inhale 2 puffs into the lungs every 4 (four) hours as needed for wheezing or shortness of breath. 08/11/15  Yes Birdie Sons, MD  albuterol (PROVENTIL) (2.5 MG/3ML) 0.083% nebulizer solution Take 3 mLs (2.5 mg total) by nebulization every 4 (four) hours as needed for wheezing. 10/12/15  Yes Theodoro Grist, MD  alendronate (FOSAMAX) 70 MG tablet Take 1 tablet (70 mg total) by mouth every 7 (seven) days. Take with a full glass of water on an empty stomach. 06/13/17  Yes Fisher, Kirstie Peri, MD  ALPRAZolam Duanne Moron) 0.5 MG tablet Take 1 tablet (0.5 mg total) by mouth 3 (three) times daily as needed for anxiety. 06/03/17  Yes Birdie Sons, MD  celecoxib (CELEBREX) 100 MG capsule Take 1 capsule (100 mg total) by mouth 2 (two) times daily as needed. 06/03/17  Yes Birdie Sons, MD  clopidogrel (PLAVIX) 75 MG tablet Take 75 mg by mouth daily.   Yes  [provider]  donepezil (ARICEPT) 10 MG tablet Take 10 mg by mouth at bedtime.   Yes [provider]  estropipate (OGEN) 0.75 MG tablet take 1 tablet by mouth every other day 08/18/17  Yes Fisher, Kirstie Peri, MD  fentaNYL (DURAGESIC - DOSED MCG/HR) 50 MCG/HR Place 1 patch (50 mcg total) onto the skin every 3 (three) days. 09/01/17 10/01/17 Yes Milinda Pointer, MD  furosemide (LASIX) 40 MG tablet Take 40 mg by mouth daily.  02/16/17  Yes [provider]  LYRICA 300 MG capsule Take 300 mg by mouth 2 (two) times daily. 04/12/17  Yes [provider]  mupirocin ointment (BACTROBAN) 2 % Place 1 application into the nose 2 (two) times daily. 06/03/17  Yes Birdie Sons, MD  NARCAN 4 MG/0.1ML LIQD nasal spray kit Place 0.4 mg into the nose once.  09/11/17  Yes [provider]  pantoprazole (PROTONIX) 40 MG tablet take 1 tablet by mouth twice a day Patient taking differently: Take 40 mg by mouth once daily 04/17/17  Yes Fisher, Kirstie Peri, MD  simvastatin (ZOCOR) 10 MG tablet Take 10 mg by mouth at bedtime. Reported on 12/04/2015   Yes [provider]  SPIRIVA HANDIHALER 18 MCG inhalation capsule inhale the contents of one capsule in the handihaler once daily 11/16/16  Yes Fisher, Kirstie Peri, MD  SUMAtriptan (IMITREX) 25 MG tablet Take 25 mg by mouth as needed for migraine. May repeat in 2 hours if headache persists or recurs.   Yes [provider]  traMADol (ULTRAM) 50 MG tablet Take 2 tablets (100 mg total) by mouth every 6 (six) hours as needed for moderate pain or severe pain. 08/02/17 10/31/17 Yes Milinda Pointer, MD  triamcinolone ointment (KENALOG) 0.5 % Apply to lesions on hands twice a day as needed 10/24/16  Yes Fisher, Kirstie Peri, MD  diphenoxylate-atropine (LOMOTIL) 2.5-0.025 MG per tablet Take 2 tablets by mouth 4 (four) times daily as needed for diarrhea or loose stools. Reported on 03/11/2016    [provider]  fentaNYL (DURAGESIC  - DOSED MCG/HR) 50 MCG/HR Place 1 patch (50 mcg total) onto the skin every 3 (three) days. 08/02/17 09/01/17  Milinda Pointer, MD  fentaNYL (DURAGESIC - DOSED MCG/HR) 50 MCG/HR Place 1 patch (50 mcg total) onto the skin every 3 (three) days. 10/01/17 10/31/17  Milinda Pointer, MD  HYDROcodone-acetaminophen (NORCO/VICODIN) 5-325 MG tablet Take 1 tablet by mouth every 8 (eight) hours as needed for severe pain. 08/12/17 08/19/17  Milinda Pointer, MD    Allergies Percocet [oxycodone-acetaminophen]; Aspirin; Codeine; Propoxyphene; and Sulfa antibiotics  Family History  Problem Relation Age of Onset  . Cancer Mother   . Arthritis Mother   . Heart disease Mother   . Diabetes Mother        mellitus, type 2  . Heart disease Father   . Diabetes Sister   . Cancer Brother   . Cancer Brother        lung  . Diabetes Brother     Social History Social History  Substance Use Topics  . Smoking status: Current Every Day Smoker    Packs/day: 0.50    Years: 50.00    Types: Cigarettes  . Smokeless tobacco: Never Used     Comment: Previously smoked 2 ppd  . Alcohol use No    Review of Systems level V exemption history Limited by the patient's clinical condition ____________________________________________   PHYSICAL EXAM:  VITAL SIGNS: ED Triage Vitals  Enc Vitals Group     BP 09/12/17 1446 (!) 137/56     Pulse Rate 09/12/17 1446 90     Resp 09/12/17 1446 16     Temp 09/12/17 1446 (!) 102.1 F (38.9 C)     Temp Source 09/12/17 1446 Oral     SpO2 09/12/17 1446 98 %     Weight 09/12/17 1446 93 lb (42.2 kg)     Height 09/12/17 1446 '4\' 10"'$  (1.473 m)     Head Circumference --      Peak Flow --      Pain Score 09/12/17 1457 4     Pain Loc --      Pain Edu? --      Excl. in Pulcifer? --     Constitutional: somnolent but arousable to painful stimulus Eyes: PERRL EOMI. pupils midrange and brisk Head: Atraumatic. Nose: No congestion/rhinnorhea. Mouth/Throat: No trismus Neck: No stridor.   no meningismus Cardiovascular: Normal rate, regular rhythm. Grossly normal heart sounds.  Good peripheral circulation. Respiratory: Normal respiratory effort.  No retractions. Lungs CTAB and moving good air Gastrointestinal: soft nontender Musculoskeletal: No lower extremity edema   Neurologic:  moves all 4 extremities feels all 4 extremities Skin:  Skin is warm, dry and intact. No rash noted. Psychiatric: somnolent and difficult to arouse    ____________________________________________   DIFFERENTIAL includes but not limited to  sepsis, urinary tract infection, hyperammonemia, meningitis, encephalitis, intracerebral hemorrhage ____________________________________________   LABS (all labs ordered are listed, but only abnormal results are displayed)  Labs Reviewed  CBC - Abnormal; Notable for the following:       Result Value   RBC 3.26 (*)    Hemoglobin 9.1 (*)    HCT 27.6 (*)    RDW 18.4 (*)    All other components within normal limits  URINALYSIS, COMPLETE (UACMP) WITH MICROSCOPIC - Abnormal; Notable for the following:    Color, Urine COLORLESS (*)    APPearance HAZY (*)    Specific Gravity, Urine 1.002 (*)    Squamous Epithelial / LPF 0-5 (*)    All other components within normal limits  COMPREHENSIVE METABOLIC PANEL - Abnormal; Notable for the following:    Chloride 100 (*)    Glucose, Bld 143 (*)    Creatinine, Ser 1.16 (*)    Calcium 8.2 (*)    Total Protein 6.4 (*)    Albumin 3.3 (*)    ALT 10 (*)    GFR calc non Af Amer 47 (*)    GFR calc Af Amer 55 (*)    All other components within normal limits  AMMONIA - Abnormal; Notable for the following:    Ammonia <9 (*)    All other components within normal limits  CULTURE, BLOOD (ROUTINE X 2)  CULTURE, BLOOD (ROUTINE X 2)  URINE CULTURE  CSF CULTURE  GRAM STAIN  LACTIC ACID, PLASMA  LIPASE, BLOOD  TROPONIN I  PROCALCITONIN  PROTIME-INR    blood work reviewed and interpreted by me shows no acute  disease __________________________________________  EKG  ED ECG REPORT I, Darel Hong, the attending physician, personally viewed and interpreted this ECG.  Date: 09/12/2017 EKG Time:  Rate: 81 Rhythm: normal sinus rhythm QRS Axis: normal Intervals: borderline prolonged QTC ST/T Wave abnormalities: normal Narrative Interpretation: no evidence of acute ischemia  ____________________________________________  RADIOLOGY  chest x-ray reviewed by me shows no acute disease Head CT reviewed by me shows no acute disease ____________________________________________   PROCEDURES  Procedure(s) performed: no  Procedures  Critical Care performed: yes  CRITICAL CARE Performed by: Darel Hong   Total critical care time: 40 minutes  Critical care time was exclusive of separately billable procedures and treating other patients.  Critical care was necessary to treat or prevent imminent or life-threatening deterioration.  Critical care was time spent personally by me on the following activities: development of treatment plan with patient and/or surrogate as well as nursing, discussions with consultants, evaluation of patient's response to treatment, examination of patient, obtaining history from patient or surrogate, ordering and performing treatments and interventions, ordering and review of laboratory studies, ordering and review of radiographic studies, pulse oximetry and re-evaluation of patient's condition.   Observation: no ____________________________________________   INITIAL IMPRESSION / ASSESSMENT AND PLAN / ED COURSE  Pertinent labs & imaging results that were available during my care of the patient were reviewed by me and considered in my medical decision making (see chart for details).  The patient arrives somnolent, responsive to painful stimulus. She is 102.1. Differential is broad but certainly concerning for sepsis.  assessment vancomycin as well as IV  fluids are pending.     ----------------------------------------- 6:52 PM on 09/12/2017 -----------------------------------------  The patient is more awake  than earlier, however still unable to walk and is not behaving normally. At this point I do not have a clear source and given her fever and altered mental status and would like to perform a lumbar puncture. Family consents. Unfortunately the CBC was not run earlier. I just called the lab who recognized the air and we'll run the CBC now for platelets prior to lumbar puncture. ____________________________________________   ----------------------------------------- 8:07 PM on 09/12/2017 -----------------------------------------  The patient is now awake alert and appropriate and cooperative. She says she "got sick" earlier and passed out. She says this has happened her multiple times in the past and no one has ever been able to figure out why. She declines lumbar puncture at this time and insists on going home. I explained to the patient and her husband at bedside that significant diagnostic uncertainty still exists and I do not know why she has a fever and do not know why she was confused. The patient and her husband understand the uncertainty and they assure me they will come back to the emergency department in 11 hours at 7 AM for reevaluation. She is discharged home in improved condition.   FINAL CLINICAL IMPRESSION(S) / ED DIAGNOSES  Final diagnoses:  Fever, unspecified fever cause  Altered mental status, unspecified altered mental status type      NEW MEDICATIONS STARTED DURING THIS VISIT:  Discharge Medication List as of 09/12/2017  8:09 PM       Note:  This document was prepared using Dragon voice recognition software and may include unintentional dictation errors.     Darel Hong, MD 09/12/17 [id number]

## 2017-09-12 NOTE — ED Notes (Signed)
Assisted pt to ambulate from the stretcher to the commode and back to the stretcher with the assistance of family member in the room; pt is hunched over on one side but is able to ambulate; pt displays a slow, steady and even gait. Unable to collect a urine sample, it was contaminated with fecal matter.

## 2017-09-12 NOTE — ED Notes (Signed)
Called Lab to inquire about the results of the CBC; lab tech Jaynie CollinsLamont said he will call back when he has more information.

## 2017-09-12 NOTE — ED Notes (Signed)
Code sepsis  Called to [facility]

## 2017-09-12 NOTE — ED Notes (Signed)
Husband advised that he did take the fentanyl patch off her back around 14:25.

## 2017-09-12 NOTE — ED Triage Notes (Signed)
Pt arrived via POV. Pt slumped over in wheelchair in triage. Pt husband reports pt niece found pt out in driveway and pt would not respond to her. Pt niece reported that pt had brown substance coming out of her nose and her mouth. Pt very drowsy, responds to pain and will answer questions.

## 2017-09-13 DIAGNOSIS — J449 Chronic obstructive pulmonary disease, unspecified: Secondary | ICD-10-CM | POA: Diagnosis not present

## 2017-09-14 LAB — URINE CULTURE: Culture: <10000 — AB

## 2017-09-15 DIAGNOSIS — J449 Chronic obstructive pulmonary disease, unspecified: Secondary | ICD-10-CM | POA: Diagnosis not present

## 2017-09-16 ENCOUNTER — Telehealth: Payer: Self-pay | Admitting: Family Medicine

## 2017-09-16 DIAGNOSIS — I10 Essential (primary) hypertension: Secondary | ICD-10-CM | POA: Diagnosis not present

## 2017-09-16 DIAGNOSIS — J441 Chronic obstructive pulmonary disease with (acute) exacerbation: Secondary | ICD-10-CM | POA: Diagnosis not present

## 2017-09-16 DIAGNOSIS — J9611 Chronic respiratory failure with hypoxia: Secondary | ICD-10-CM | POA: Diagnosis not present

## 2017-09-16 DIAGNOSIS — G8929 Other chronic pain: Secondary | ICD-10-CM | POA: Diagnosis not present

## 2017-09-16 DIAGNOSIS — I739 Peripheral vascular disease, unspecified: Secondary | ICD-10-CM | POA: Diagnosis not present

## 2017-09-16 DIAGNOSIS — I2510 Atherosclerotic heart disease of native coronary artery without angina pectoris: Secondary | ICD-10-CM | POA: Diagnosis not present

## 2017-09-16 DIAGNOSIS — G629 Polyneuropathy, unspecified: Secondary | ICD-10-CM | POA: Diagnosis not present

## 2017-09-16 DIAGNOSIS — K219 Gastro-esophageal reflux disease without esophagitis: Secondary | ICD-10-CM | POA: Diagnosis not present

## 2017-09-16 DIAGNOSIS — M810 Age-related osteoporosis without current pathological fracture: Secondary | ICD-10-CM | POA: Diagnosis not present

## 2017-09-16 NOTE — Telephone Encounter (Signed)
Cheryl [name] with [facility] wanted to let Dr. Sherrie MustacheFisher know that pt fell yesterday 09/15/17 at home and has a small abrasion on her left shoulder. Pt also went to ED last Friday 09/12/17 b/c granddaughter felt pt was disoriented. Pt is scheduled for ED F/U on 09/19/17. Thanks TNP

## 2017-09-17 LAB — CULTURE, BLOOD (ROUTINE X 2): Culture: NO GROWTH

## 2017-09-19 ENCOUNTER — Telehealth: Payer: Self-pay | Admitting: Family Medicine

## 2017-09-19 ENCOUNTER — Ambulatory Visit
Admission: RE | Admit: 2017-09-19 | Discharge: 2017-09-19 | Disposition: A | Discharge status: Home / Self Care | Payer: Medicare HMO | Source: Ambulatory Visit | Attending: Family Medicine | Admitting: Family Medicine

## 2017-09-19 ENCOUNTER — Other Ambulatory Visit: Payer: Self-pay | Admitting: Family Medicine

## 2017-09-19 ENCOUNTER — Encounter: Payer: Self-pay | Admitting: Family Medicine

## 2017-09-19 ENCOUNTER — Ambulatory Visit (INDEPENDENT_AMBULATORY_CARE_PROVIDER_SITE_OTHER): Payer: Medicare HMO | Admitting: Family Medicine

## 2017-09-19 VITALS — BP 142/64 | HR 78 | Temp 98.6°F | Resp 16

## 2017-09-19 DIAGNOSIS — R0781 Pleurodynia: Secondary | ICD-10-CM

## 2017-09-19 DIAGNOSIS — X58XXXA Exposure to other specified factors, initial encounter: Secondary | ICD-10-CM | POA: Insufficient documentation

## 2017-09-19 DIAGNOSIS — S2231XA Fracture of one rib, right side, initial encounter for closed fracture: Secondary | ICD-10-CM | POA: Diagnosis not present

## 2017-09-19 DIAGNOSIS — M7062 Trochanteric bursitis, left hip: Secondary | ICD-10-CM | POA: Diagnosis not present

## 2017-09-19 DIAGNOSIS — S2239XA Fracture of one rib, unspecified side, initial encounter for closed fracture: Secondary | ICD-10-CM | POA: Insufficient documentation

## 2017-09-19 HISTORY — DX: Fracture of one rib, unspecified side, initial encounter for closed fracture: S22.39XA

## 2017-09-19 MED ORDER — METHYLPREDNISOLONE ACETATE 80 MG/ML IJ SUSP
80.0000 mg | Freq: Once | INTRAMUSCULAR | Status: AC
  Administered 2017-09-19: 80 mg via INTRAMUSCULAR

## 2017-09-19 NOTE — Telephone Encounter (Signed)
Pt is returning call.  WU#[phone]/NFCB#[phone]/MW

## 2017-09-19 NOTE — Progress Notes (Signed)
Patient: Cheryl [name] Female    DOB: 1951/11/10   66 y.o.   MRN: [id number] Visit Date: 09/19/2017  Today's Provider: Lelon Huh, MD   Chief Complaint  Patient presents with  . Follow-up   Subjective:    HPI   Follow up ER visit  Patient was seen in ER for fever and Altered mental status on 09/12/2017.  She was treated for; symptoms were concerning for sepsis.  Treatment for this included; Patient was given vancomycin as well as IV fluids. Lumbar puncture was recommended but patient declined and requested to go home. She reports good compliance with treatment. She reports this condition is Improved. She has had no more fevers since ER visit and no episodes of altered mental status.   ------------------------------------------------------------------------------------  Her primary complaint today is worsening pain along side left hip and pin in right ribs since falling about a week ago. No difficulty breathing. No chest pains. She does have osteoporosis and is on alendronate.     Allergies  Allergen Reactions  . Percocet [Oxycodone-Acetaminophen] Hives and Rash  . Aspirin Nausea And Vomiting and Other (See Comments)    Reaction:  GI upset   . Codeine Nausea And Vomiting, Other (See Comments) and Nausea Only    Reaction:  GI upset   . Propoxyphene Other (See Comments) and Nausea Only    GI upset Reaction:  GI upset   . Sulfa Antibiotics Rash and Other (See Comments)    Reaction:  GI upset      Current Outpatient Prescriptions:  .  acetaminophen (TYLENOL) 325 MG tablet, Take 2 tablets (650 mg total) by mouth every 6 (six) hours as needed for mild pain (or Fever >/= 101)., Disp: 30 tablet, Rfl: 0 .  ADVAIR DISKUS 250-50 MCG/DOSE AEPB, inhale 1 dose by mouth twice a day, Disp: 60 each, Rfl: 1 .  albuterol (PROVENTIL HFA;VENTOLIN HFA) 108 (90 BASE) MCG/ACT inhaler, Inhale 2 puffs into the lungs every 4 (four) hours as needed for wheezing or shortness of  breath., Disp: 18 g, Rfl: 3 .  albuterol (PROVENTIL) (2.5 MG/3ML) 0.083% nebulizer solution, Take 3 mLs (2.5 mg total) by nebulization every 4 (four) hours as needed for wheezing., Disp: 75 mL, Rfl: 12 .  alendronate (FOSAMAX) 70 MG tablet, Take 1 tablet (70 mg total) by mouth every 7 (seven) days. Take with a full glass of water on an empty stomach., Disp: 4 tablet, Rfl: 11 .  ALPRAZolam (XANAX) 0.5 MG tablet, Take 1 tablet (0.5 mg total) by mouth 3 (three) times daily as needed for anxiety., Disp: 60 tablet, Rfl: 3 .  celecoxib (CELEBREX) 100 MG capsule, Take 1 capsule (100 mg total) by mouth 2 (two) times daily as needed., Disp: 60 capsule, Rfl: 1 .  clopidogrel (PLAVIX) 75 MG tablet, Take 75 mg by mouth daily., Disp: , Rfl:  .  diphenoxylate-atropine (LOMOTIL) 2.5-0.025 MG per tablet, Take 2 tablets by mouth 4 (four) times daily as needed for diarrhea or loose stools. Reported on 03/11/2016, Disp: , Rfl:  .  donepezil (ARICEPT) 10 MG tablet, Take 10 mg by mouth at bedtime., Disp: , Rfl:  .  estropipate (OGEN) 0.75 MG tablet, take 1 tablet by mouth every other day, Disp: 30 tablet, Rfl: 3 .  fentaNYL (DURAGESIC - DOSED MCG/HR) 50 MCG/HR, Place 1 patch (50 mcg total) onto the skin every 3 (three) days., Disp: 10 patch, Rfl: 0 .  [START ON 10/01/2017] fentaNYL (DURAGESIC - DOSED  MCG/HR) 50 MCG/HR, Place 1 patch (50 mcg total) onto the skin every 3 (three) days., Disp: 10 patch, Rfl: 0 .  furosemide (LASIX) 40 MG tablet, Take 40 mg by mouth daily. , Disp: , Rfl:  .  LYRICA 300 MG capsule, Take 300 mg by mouth 2 (two) times daily., Disp: , Rfl: 0 .  mupirocin ointment (BACTROBAN) 2 %, Place 1 application into the nose 2 (two) times daily., Disp: 30 g, Rfl: 2 .  NARCAN 4 MG/0.1ML LIQD nasal spray kit, Place 0.4 mg into the nose once. , Disp: , Rfl:  .  pantoprazole (PROTONIX) 40 MG tablet, take 1 tablet by mouth twice a day (Patient taking differently: Take 40 mg by mouth once daily), Disp: 60 tablet,  Rfl: 12 .  simvastatin (ZOCOR) 10 MG tablet, Take 10 mg by mouth at bedtime. Reported on 12/04/2015, Disp: , Rfl:  .  SPIRIVA HANDIHALER 18 MCG inhalation capsule, inhale the contents of one capsule in the handihaler once daily, Disp: 30 capsule, Rfl: 12 .  SUMAtriptan (IMITREX) 25 MG tablet, Take 25 mg by mouth as needed for migraine. May repeat in 2 hours if headache persists or recurs., Disp: , Rfl:  .  traMADol (ULTRAM) 50 MG tablet, Take 2 tablets (100 mg total) by mouth every 6 (six) hours as needed for moderate pain or severe pain., Disp: 240 tablet, Rfl: 2 .  triamcinolone ointment (KENALOG) 0.5 %, Apply to lesions on hands twice a day as needed, Disp: 30 g, Rfl: 1 .  fentaNYL (DURAGESIC - DOSED MCG/HR) 50 MCG/HR, Place 1 patch (50 mcg total) onto the skin every 3 (three) days., Disp: 10 patch, Rfl: 0 .  HYDROcodone-acetaminophen (NORCO/VICODIN) 5-325 MG tablet, Take 1 tablet by mouth every 8 (eight) hours as needed for severe pain., Disp: 21 tablet, Rfl: 0  Review of Systems  Constitutional: Negative for appetite change, chills, fatigue and fever.  Respiratory: Negative for chest tightness and shortness of breath.   Cardiovascular: Negative for chest pain and palpitations.  Gastrointestinal: Negative for abdominal pain, nausea and vomiting.  Neurological: Negative for dizziness and weakness.    Social History  Substance Use Topics  . Smoking status: Current Every Day Smoker    Packs/day: 0.50    Years: 50.00    Types: Cigarettes  . Smokeless tobacco: Never Used     Comment: Previously smoked 2 ppd  . Alcohol use No   Objective:   BP (!) 142/64 (BP Location: Left Arm, Patient Position: Sitting, Cuff Size: Normal)   Pulse 78   Temp 98.6 F (37 C) (Oral)   Resp 16   SpO2 99% Comment: room air There were no vitals filed for this visit.   Physical Exam   General Appearance:    Alert, cooperative, no distress  Eyes:    PERRL, conjunctiva/corneas clear, EOM's intact         Lungs:     Clear to auscultation bilaterally, respirations unlabored  Heart:    Regular rate and rhythm  Neurologic:   Awake, alert, oriented x 3. No apparent focal neurological           defect.   MS:   Point tenderness over right lateral rib cage. Diffuse tenderness along left lateral hip and femur.        Assessment & Plan:     1. Trochanteric bursitis of left hip IM infection methylPREDNISolone acetate (DEPO-MEDROL) injection 80 mg; Inject 1 mL (80 mg total) into the muscle once.  2. Rib pain on right side  - DG Ribs Unilateral Right; Future  3. Fever  - Resolved since ER visit after being given IV Vancomycin.       Lelon Huh, MD  [facility]

## 2017-09-19 NOTE — Telephone Encounter (Signed)
Notified. 

## 2017-09-19 NOTE — Telephone Encounter (Signed)
Pt called back asking to speak with Dr. Theodis AguasFisher's nurse to get results of Xray. Please advise. Thanks TNP

## 2017-09-22 DIAGNOSIS — Z720 Tobacco use: Secondary | ICD-10-CM | POA: Diagnosis not present

## 2017-09-22 DIAGNOSIS — R0602 Shortness of breath: Secondary | ICD-10-CM | POA: Diagnosis not present

## 2017-09-22 DIAGNOSIS — I2510 Atherosclerotic heart disease of native coronary artery without angina pectoris: Secondary | ICD-10-CM | POA: Diagnosis not present

## 2017-09-23 ENCOUNTER — Encounter: Payer: Self-pay | Admitting: Nurse Practitioner

## 2017-09-23 ENCOUNTER — Ambulatory Visit: Payer: Medicare HMO | Attending: Nurse Practitioner | Admitting: Nurse Practitioner

## 2017-09-23 VITALS — BP 163/69 | HR 84 | Temp 97.6°F | Resp 16 | Ht <= 58 in | Wt 93.0 lb

## 2017-09-23 DIAGNOSIS — Z7902 Long term (current) use of antithrombotics/antiplatelets: Secondary | ICD-10-CM | POA: Insufficient documentation

## 2017-09-23 DIAGNOSIS — I10 Essential (primary) hypertension: Secondary | ICD-10-CM | POA: Diagnosis not present

## 2017-09-23 DIAGNOSIS — F17210 Nicotine dependence, cigarettes, uncomplicated: Secondary | ICD-10-CM | POA: Insufficient documentation

## 2017-09-23 DIAGNOSIS — E43 Unspecified severe protein-calorie malnutrition: Secondary | ICD-10-CM | POA: Insufficient documentation

## 2017-09-23 DIAGNOSIS — M47816 Spondylosis without myelopathy or radiculopathy, lumbar region: Secondary | ICD-10-CM | POA: Diagnosis not present

## 2017-09-23 DIAGNOSIS — E785 Hyperlipidemia, unspecified: Secondary | ICD-10-CM | POA: Diagnosis not present

## 2017-09-23 DIAGNOSIS — F410 Panic disorder [episodic paroxysmal anxiety] without agoraphobia: Secondary | ICD-10-CM | POA: Diagnosis not present

## 2017-09-23 DIAGNOSIS — M546 Pain in thoracic spine: Secondary | ICD-10-CM | POA: Insufficient documentation

## 2017-09-23 DIAGNOSIS — E876 Hypokalemia: Secondary | ICD-10-CM | POA: Diagnosis not present

## 2017-09-23 DIAGNOSIS — F419 Anxiety disorder, unspecified: Secondary | ICD-10-CM | POA: Diagnosis not present

## 2017-09-23 DIAGNOSIS — F0390 Unspecified dementia without behavioral disturbance: Secondary | ICD-10-CM | POA: Diagnosis not present

## 2017-09-23 DIAGNOSIS — K219 Gastro-esophageal reflux disease without esophagitis: Secondary | ICD-10-CM | POA: Diagnosis not present

## 2017-09-23 DIAGNOSIS — M5442 Lumbago with sciatica, left side: Secondary | ICD-10-CM

## 2017-09-23 DIAGNOSIS — M545 Low back pain: Secondary | ICD-10-CM | POA: Insufficient documentation

## 2017-09-23 DIAGNOSIS — Z79891 Long term (current) use of opiate analgesic: Secondary | ICD-10-CM | POA: Diagnosis not present

## 2017-09-23 DIAGNOSIS — G8929 Other chronic pain: Secondary | ICD-10-CM | POA: Diagnosis not present

## 2017-09-23 DIAGNOSIS — I739 Peripheral vascular disease, unspecified: Secondary | ICD-10-CM | POA: Insufficient documentation

## 2017-09-23 DIAGNOSIS — G9341 Metabolic encephalopathy: Secondary | ICD-10-CM | POA: Diagnosis not present

## 2017-09-23 DIAGNOSIS — Z885 Allergy status to narcotic agent status: Secondary | ICD-10-CM | POA: Insufficient documentation

## 2017-09-23 DIAGNOSIS — G894 Chronic pain syndrome: Secondary | ICD-10-CM | POA: Insufficient documentation

## 2017-09-23 DIAGNOSIS — Z79899 Other long term (current) drug therapy: Secondary | ICD-10-CM | POA: Insufficient documentation

## 2017-09-23 DIAGNOSIS — G629 Polyneuropathy, unspecified: Secondary | ICD-10-CM | POA: Diagnosis not present

## 2017-09-23 DIAGNOSIS — M961 Postlaminectomy syndrome, not elsewhere classified: Secondary | ICD-10-CM | POA: Insufficient documentation

## 2017-09-23 DIAGNOSIS — J449 Chronic obstructive pulmonary disease, unspecified: Secondary | ICD-10-CM | POA: Diagnosis not present

## 2017-09-23 DIAGNOSIS — I2510 Atherosclerotic heart disease of native coronary artery without angina pectoris: Secondary | ICD-10-CM | POA: Diagnosis not present

## 2017-09-23 DIAGNOSIS — Z5181 Encounter for therapeutic drug level monitoring: Secondary | ICD-10-CM | POA: Insufficient documentation

## 2017-09-23 DIAGNOSIS — E559 Vitamin D deficiency, unspecified: Secondary | ICD-10-CM | POA: Insufficient documentation

## 2017-09-23 DIAGNOSIS — G4700 Insomnia, unspecified: Secondary | ICD-10-CM | POA: Insufficient documentation

## 2017-09-23 DIAGNOSIS — M5441 Lumbago with sciatica, right side: Secondary | ICD-10-CM | POA: Diagnosis not present

## 2017-09-23 DIAGNOSIS — M797 Fibromyalgia: Secondary | ICD-10-CM | POA: Diagnosis not present

## 2017-09-23 DIAGNOSIS — Z9049 Acquired absence of other specified parts of digestive tract: Secondary | ICD-10-CM | POA: Insufficient documentation

## 2017-09-23 DIAGNOSIS — M4726 Other spondylosis with radiculopathy, lumbar region: Secondary | ICD-10-CM | POA: Insufficient documentation

## 2017-09-23 DIAGNOSIS — Z7901 Long term (current) use of anticoagulants: Secondary | ICD-10-CM | POA: Insufficient documentation

## 2017-09-23 MED ORDER — TRAMADOL HCL 50 MG PO TABS: 100.0000 mg | ORAL_TABLET | Freq: Four times a day (QID) | ORAL | 0 refills | Status: DC | PRN

## 2017-09-23 NOTE — Progress Notes (Signed)
Nursing Pain Medication Assessment:  Safety precautions to be maintained throughout the outpatient stay will include: orient to surroundings, keep bed in low position, maintain call bell within reach at all times, provide assistance with transfer out of bed and ambulation.  Medication Inspection Compliance: Pill count conducted under aseptic conditions, in front of the patient. Neither the pills nor the bottle was removed from the patient's sight at any time. Once count was completed pills were immediately returned to the patient in their original bottle.  Medication: See above Pill/Patch Count: 179 of 240 pills remain Pill/Patch Appearance: Markings consistent with prescribed medication Bottle Appearance: Standard pharmacy container. Clearly labeled. Filled Date: 410 / 26 / 2018 Last Medication intake:  Today

## 2017-09-23 NOTE — Progress Notes (Signed)
Patient's Name: Cheryl [name]  MRN: [id number]  Referring Provider: Birdie Sons, MD  DOB: 03/27/1951  PCP: Birdie Sons, MD  DOS: 09/23/2017  Note by: Vevelyn Francois NP  Service setting: Ambulatory outpatient  Specialty: Interventional Pain Management  Location: [facility] (AMB) Pain Management Facility    Patient type: Established    Primary Reason(s) for Visit: Encounter for prescription drug management & post-procedure evaluation of chronic illness with mild to moderate exacerbation(Level of risk: moderate) CC: Back Pain (lower)  HPI  Cheryl [name] is a 66 y.o. year old, female patient, who comes today for a post-procedure evaluation and medication management. She has Essential hypertension; GERD (gastroesophageal reflux disease); Hyperlipemia; Anxiety; Airway hyperreactivity; Back pain, thoracic; Coronary artery disease; Excessive falling; Alteration in bowel elimination: incontinence; Insomnia; Decreased testosterone level; Leg weakness; Menopausal symptom; Migraine; Neuropathy ([place]); Fecal occult blood test positive; OP (osteoporosis); Panic disorder; Compulsive tobacco user syndrome; Urinary incontinence; Vitamin D deficiency; Weight loss; COPD (chronic obstructive pulmonary disease) ([place]); Dementia; Lumbar radicular pain (Bilateral) (L>R) (L4); Chronic low back pain (Location of Primary Source of Pain) (Bilateral) (L>R); Encounter for therapeutic drug level monitoring; Uncomplicated opioid dependence ([place]); Chronic pain syndrome; Peripheral nerve disease; Peripheral vascular disease ([place]); Platelet inhibition due to Plavix; Abnormal mammogram of right breast; Dilated intrahepatic bile duct; Dysphagia; Acute diastolic CHF (congestive heart failure) ([place]); Leukocytosis; Long term current use of opiate analgesic; Fibromyalgia; Osteoarthrosis; Long term current use of anticoagulant therapy (Plavix); Opiate use (160 MME/Day); Anemia; Lumbar facet syndrome (Location of Primary Source of Pain)  (Bilateral) (L>R); Lumbar spondylosis; Nausea with vomiting; Protein-calorie malnutrition, severe; Metabolic encephalopathy; Hypokalemia; Acute postoperative pain; Pneumonia; Marijuana use; and Rib fracture on her problem list. Her primarily concern today is the Back Pain (lower)  Pain Assessment: Location: Lower Back Radiating: down back of leg to foot Onset: More than a month ago Duration: Chronic pain Quality: Aching, Constant, Grimacing, Throbbing, Crying, Discomfort Severity: 3 /10 (self-reported pain score)  Note: Reported level is compatible with observation.                         When using our objective Pain Scale, levels between 6 and 10/10 are said to belong in an emergency room, as it progressively worsens from a 6/10, described as severely limiting, requiring emergency care not usually available at an outpatient pain management facility. At a 6/10 level, communication becomes difficult and requires great effort. Assistance to reach the emergency department may be required. Facial flushing and profuse sweating along with potentially dangerous increases in heart rate and blood pressure will be evident. Effect on ADL: hard to do house work Timing: Constant Modifying factors: medications  Ms. Jobson was last seen on 06/05/2017 for a procedure. During today's appointment we reviewed Cheryl [name]'s post-procedure results, as well as her outpatient medication regimen. She denies any side effects over constipation or over sedation. She denies falls during interview but then admits that she suffered a fall with a rib fracture.  Further details on both, my assessment(s), as well as the proposed treatment plan, please see below.  Controlled Substance Pharmacotherapy Assessment REMS (Risk Evaluation and Mitigation Strategy)  Analgesic:Duragesic 50 mcg/h every 72 hours + Tramadol 100 mg PO q6hrs (400 mg/day) MME/day:160 mg/day.  Ignatius Specking, RN  09/23/2017 11:09 AM  Sign at close  encounter Nursing Pain Medication Assessment:  Safety precautions to be maintained throughout the outpatient stay will include: orient to surroundings, keep bed in low position,  maintain call bell within reach at all times, provide assistance with transfer out of bed and ambulation.  Medication Inspection Compliance: Pill count conducted under aseptic conditions, in front of the patient. Neither the pills nor the bottle was removed from the patient's sight at any time. Once count was completed pills were immediately returned to the patient in their original bottle.  Medication: See above Pill/Patch Count: 179 of 240 pills remain Pill/Patch Appearance: Markings consistent with prescribed medication Bottle Appearance: Standard pharmacy container. Clearly labeled. Filled Date: 1 / 26 / 2018 Last Medication intake:  Today  Her son states that she has medication at home in her planner and he did not bring them all.  She does not have her Fentanyl secondary it has not been pick up at the pharmacy.   Pharmacokinetics: Liberation and absorption (onset of action): WNL Distribution (time to peak effect): WNL Metabolism and excretion (duration of action): WNL         Pharmacodynamics: Desired effects: Analgesia: Cheryl [name] reports >50% benefit. Functional ability: Patient reports that medication allows her to accomplish basic ADLs Clinically meaningful improvement in function (CMIF): Sustained CMIF goals met Perceived effectiveness: Described as relatively effective, allowing for increase in activities of daily living (ADL) Undesirable effects: Side-effects or Adverse reactions: None reported Monitoring: [place] PMP: Online review of the past 78-monthperiod conducted. Compliant with practice rules and regulations Last UDS on record: Summary  Date Value Ref Range Status  06/05/2017 FINAL  Final    Comment:    ==================================================================== TOXASSURE SELECT 13  (MW) ==================================================================== Test                             Result       Flag       Units Drug Present and Declared for Prescription Verification   Fentanyl                       33           EXPECTED   ng/mg creat   Norfentanyl                    321          EXPECTED   ng/mg creat    Source of fentanyl is a scheduled prescription medication,    including IV, patch, and transmucosal formulations. Norfentanyl    is an expected metabolite of fentanyl.   Tramadol                       PRESENT      EXPECTED   O-Desmethyltramadol            PRESENT      EXPECTED   N-Desmethyltramadol            PRESENT      EXPECTED    Source of tramadol is a prescription medication.    O-desmethyltramadol and N-desmethyltramadol are expected    metabolites of tramadol. Drug Absent but Declared for Prescription Verification   Alprazolam                     Not Detected UNEXPECTED ng/mg creat ==================================================================== Test                      Result    Flag   Units      Ref Range  Creatinine              33               mg/dL      >=20 ==================================================================== Declared Medications:  The flagging and interpretation on this report are based on the  following declared medications.  Unexpected results may arise from  inaccuracies in the declared medications.  **Note: The testing scope of this panel includes these medications:  Alprazolam (Xanax)  Fentanyl  Tramadol  **Note: The testing scope of this panel does not include following  reported medications:  Acetaminophen (Tylenol)  Albuterol  Atropine (Lomotil)  Celecoxib (Celebrex)  Clopidogrel (Plavix)  Diphenoxylate (Lomotil)  Donepezil (Aricept)  Estropipate (Ogen)  Fluticasone (Advair)  Mupirocin  Pantoprazole (Protonix)  Pregabalin (Lyrica)  Salmeterol (Advair)  Simvastatin (Zocor)  Sumatriptan (Imitrex)   Tiotropium (Spiriva)  Triamcinolone (Kenalog) ==================================================================== For clinical consultation, please call [phone]. ====================================================================    UDS interpretation: Compliant          Medication Assessment Form: Reviewed. Patient indicates being compliant with therapy Treatment compliance: Compliant Risk Assessment Profile: Aberrant behavior: See prior evaluations. None observed or detected today Comorbid factors increasing risk of overdose: See prior notes. No additional risks detected today Risk of substance use disorder (SUD): Low     Opioid Risk Tool - 09/23/17 1100      Family History of Substance Abuse   Alcohol Positive Female   Illegal Drugs Positive Female   Rx Drugs Positive Female or Female     Personal History of Substance Abuse   Alcohol Negative   Illegal Drugs Negative   Rx Drugs Negative     Age   Age between 66-45 years  No     History of Preadolescent Sexual Abuse   History of Preadolescent Sexual Abuse Negative or Female     Psychological Disease   Psychological Disease Negative   Depression Negative     Total Score   Opioid Risk Tool Scoring 7   Opioid Risk Interpretation Moderate Risk     ORT Scoring interpretation table:  Score <3 = Low Risk for SUD  Score between 4-7 = Moderate Risk for SUD  Score >8 = High Risk for Opioid Abuse   Risk Mitigation Strategies:  Patient Counseling: Covered Patient-Prescriber Agreement (PPA): Present and active  Notification to other healthcare providers: Done  Pharmacologic Plan: No change in therapy, at this time  Post-Procedure Assessment  08/12/2017 Procedure: Right Lumbar Facet RFA Pre-procedure pain score:  3/10 Post-procedure pain score: 0/10         Influential Factors: BMI: 19.44 kg/m Intra-procedural challenges: None observed.         Assessment challenges: None detected.              Reported  side-effects: None.        Post-procedural adverse reactions or complications: None reported         Sedation: Please see nurses note. When no sedatives are used, the analgesic levels obtained are directly associated to the effectiveness of the local anesthetics. However, when sedation is provided, the level of analgesia obtained during the initial 1 hour following the intervention, is believed to be the result of a combination of factors. These factors may include, but are not limited to: 1. The effectiveness of the local anesthetics used. 2. The effects of the analgesic(s) and/or anxiolytic(s) used. 3. The degree of discomfort experienced by the patient at the time of the procedure. 4. The patients  ability and reliability in recalling and recording the events. 5. The presence and influence of possible secondary gains and/or psychosocial factors. Reported result: Relief experienced during the 1st hour after the procedure: 100 % (Ultra-Short Term Relief)            Interpretative annotation: Clinically appropriate result. Analgesia during this period is likely to be Local Anesthetic and/or IV Sedative (Analgesic/Anxiolytic) related.          Effects of local anesthetic: The analgesic effects attained during this period are directly associated to the localized infiltration of local anesthetics and therefore cary significant diagnostic value as to the etiological location, or anatomical origin, of the pain. Expected duration of relief is directly dependent on the pharmacodynamics of the local anesthetic used. Long-acting (4-6 hours) anesthetics used.  Reported result: Relief during the next 4 to 6 hour after the procedure: 100 % (Short-Term Relief)            Interpretative annotation: Clinically appropriate result. Analgesia during this period is likely to be Local Anesthetic-related.          Long-term benefit: Defined as the period of time past the expected duration of local anesthetics (1 hour for  short-acting and 4-6 hours for long-acting). With the possible exception of prolonged sympathetic blockade from the local anesthetics, benefits during this period are typically attributed to, or associated with, other factors such as analgesic sensory neuropraxia, antiinflammatory effects, or beneficial biochemical changes provided by agents other than the local anesthetics.  Reported result: Extended relief following procedure: 20 % (Long-Term Relief)            Interpretative annotation: Clinically appropriate result. Good relief. No permanent benefit expected. Inflammation plays a part in the etiology to the pain.          Current benefits: Defined as reported results that persistent at this point in time.   Analgesia: 0-25 %           per patient Function: Somewhat improved per son. He feels like she is moving around more freely. She continues to use her walker but walks fast. She did run into a door and suffered a "black eye on left) ROM: Somewhat improved Interpretative annotation: Recurrence of symptoms. Limited therapeutic benefit.             Interpretation: Results would suggest failure of therapy in achieving desired goal(s).                  Plan:  Please see "Plan of Care" for details.        Laboratory Chemistry  Inflammation Markers (CRP: Acute Phase) (ESR: Chronic Phase) Lab Results  Component Value Date   CRP 0.6 03/01/2016   ESRSEDRATE 52 (H) 03/01/2016                 Renal Function Markers Lab Results  Component Value Date   BUN 15 09/12/2017   CREATININE 1.16 (H) 09/12/2017   GFRAA 55 (L) 09/12/2017   GFRNONAA 47 (L) 09/12/2017                 Hepatic Function Markers Lab Results  Component Value Date   AST 17 09/12/2017   ALT 10 (L) 09/12/2017   ALBUMIN 3.3 (L) 09/12/2017   ALKPHOS 83 09/12/2017                 Electrolytes Lab Results  Component Value Date   NA 137 09/12/2017   K 4.0 09/12/2017  CL 100 (L) 09/12/2017   CALCIUM 8.2 (L) 09/12/2017    MG 1.9 04/29/2017                 Neuropathy Markers Lab Results  Component Value Date   VITAMINB12 242 03/01/2016                 Bone Pathology Markers Lab Results  Component Value Date   ALKPHOS 83 09/12/2017   VD25OH 25.6 (L) 01/17/2016   VD125OH2TOT 27 07/04/2017   [id number] 24 07/04/2017   [id number] <10 07/04/2017   CALCIUM 8.2 (L) 09/12/2017                 Coagulation Parameters Lab Results  Component Value Date   INR 0.96 09/12/2017   LABPROT 12.7 09/12/2017   APTT 38 (H) 10/17/2015   PLT 221 09/12/2017                 Cardiovascular Markers Lab Results  Component Value Date   BNP 137.0 (H) 05/14/2017   HGB 9.1 (L) 09/12/2017   HCT 27.6 (L) 09/12/2017                 Note: Lab results reviewed.  Recent Diagnostic Imaging Results  DG Ribs Unilateral Right CLINICAL DATA:  Right axillary  rib pain  EXAM: RIGHT RIBS - 2 VIEW  COMPARISON:  09/12/2017, CT chest 08/04/2017  FINDINGS: Old appearing right eighth and tenth rib fractures. Acute appearing displaced right ninth rib fracture. Right lung is grossly clear. Vascular stent in the pelvis.  IMPRESSION: 1. Acute appearing displaced right ninth rib fracture 2. Old appearing right age and tenth rib fractures  Electronically Signed   By: Donavan Foil M.D.   On: 09/19/2017 14:05  Complexity Note: Imaging results reviewed. Results shared with Ms. Darnold, using Layman's terms.                         Meds   Current Outpatient Prescriptions:  .  acetaminophen (TYLENOL) 325 MG tablet, Take 2 tablets (650 mg total) by mouth every 6 (six) hours as needed for mild pain (or Fever >/= 101)., Disp: 30 tablet, Rfl: 0 .  albuterol (PROVENTIL HFA;VENTOLIN HFA) 108 (90 BASE) MCG/ACT inhaler, Inhale 2 puffs into the lungs every 4 (four) hours as needed for wheezing or shortness of breath., Disp: 18 g, Rfl: 3 .  albuterol (PROVENTIL) (2.5 MG/3ML) 0.083% nebulizer solution, Take 3 mLs (2.5 mg total) by  nebulization every 4 (four) hours as needed for wheezing., Disp: 75 mL, Rfl: 12 .  alendronate (FOSAMAX) 70 MG tablet, Take 1 tablet (70 mg total) by mouth every 7 (seven) days. Take with a full glass of water on an empty stomach., Disp: 4 tablet, Rfl: 11 .  ALPRAZolam (XANAX) 0.5 MG tablet, Take 1 tablet (0.5 mg total) by mouth 3 (three) times daily as needed for anxiety. (Patient taking differently: Take 0.5 mg by mouth every hour as needed for anxiety. ), Disp: 60 tablet, Rfl: 3 .  celecoxib (CELEBREX) 100 MG capsule, Take 1 capsule (100 mg total) by mouth 2 (two) times daily as needed., Disp: 60 capsule, Rfl: 1 .  clopidogrel (PLAVIX) 75 MG tablet, Take 75 mg by mouth daily., Disp: , Rfl:  .  diphenoxylate-atropine (LOMOTIL) 2.5-0.025 MG per tablet, Take 2 tablets by mouth 4 (four) times daily as needed for diarrhea or loose stools. Reported on 03/11/2016, Disp: , Rfl:  .  donepezil (  ARICEPT) 10 MG tablet, Take 10 mg by mouth at bedtime., Disp: , Rfl:  .  estropipate (OGEN) 0.75 MG tablet, take 1 tablet by mouth every other day, Disp: 30 tablet, Rfl: 3 .  fentaNYL (DURAGESIC - DOSED MCG/HR) 50 MCG/HR, Place 1 patch (50 mcg total) onto the skin every 3 (three) days., Disp: 10 patch, Rfl: 0 .  [START ON 10/01/2017] fentaNYL (DURAGESIC - DOSED MCG/HR) 50 MCG/HR, Place 1 patch (50 mcg total) onto the skin every 3 (three) days., Disp: 10 patch, Rfl: 0 .  furosemide (LASIX) 40 MG tablet, Take 40 mg by mouth every other day. , Disp: , Rfl:  .  LYRICA 300 MG capsule, Take 300 mg by mouth 2 (two) times daily., Disp: , Rfl: 0 .  mupirocin ointment (BACTROBAN) 2 %, Place 1 application into the nose 2 (two) times daily., Disp: 30 g, Rfl: 2 .  NARCAN 4 MG/0.1ML LIQD nasal spray kit, Place 0.4 mg into the nose once. , Disp: , Rfl:  .  pantoprazole (PROTONIX) 40 MG tablet, take 1 tablet by mouth twice a day (Patient taking differently: Take 40 mg by mouth once daily), Disp: 60 tablet, Rfl: 12 .  simvastatin  (ZOCOR) 10 MG tablet, Take 10 mg by mouth at bedtime. Reported on 12/04/2015, Disp: , Rfl:  .  SPIRIVA HANDIHALER 18 MCG inhalation capsule, inhale the contents of one capsule in the handihaler once daily, Disp: 30 capsule, Rfl: 12 .  SUMAtriptan (IMITREX) 25 MG tablet, Take 25 mg by mouth as needed for migraine. May repeat in 2 hours if headache persists or recurs., Disp: , Rfl:  .  [START ON 10/19/2017] traMADol (ULTRAM) 50 MG tablet, Take 2 tablets (100 mg total) by mouth every 6 (six) hours as needed for moderate pain or severe pain., Disp: 240 tablet, Rfl: 0 .  triamcinolone ointment (KENALOG) 0.5 %, Apply to lesions on hands twice a day as needed, Disp: 30 g, Rfl: 1 .  ADVAIR DISKUS 250-50 MCG/DOSE AEPB, inhale 1 dose by mouth twice a day, Disp: 60 each, Rfl: 1 .  fentaNYL (DURAGESIC - DOSED MCG/HR) 50 MCG/HR, Place 1 patch (50 mcg total) onto the skin every 3 (three) days., Disp: 10 patch, Rfl: 0  ROS  Constitutional: Denies any fever or chills Gastrointestinal: No reported hemesis, hematochezia, vomiting, or acute GI distress Musculoskeletal: Denies any acute onset joint swelling, redness, loss of ROM, or weakness Neurological: No reported episodes of acute onset apraxia, aphasia, dysarthria, agnosia, amnesia, paralysis, loss of coordination, or loss of consciousness  Allergies  Ms. Hulett is allergic to percocet [oxycodone-acetaminophen]; aspirin; codeine; propoxyphene; and sulfa antibiotics.  [place]  Drug: Ms. Nieland  reports that she does not use drugs. Alcohol:  reports that she does not drink alcohol. Tobacco:  reports that she has been smoking Cigarettes.  She has a 25.00 pack-year smoking history. She has never used smokeless tobacco. Medical:  has a past medical history of Allergy; Anxiety; Arthritis; Aspiration pneumonitis ([place]) (11/24/2015); Asthma; Chronic pain; COPD (chronic obstructive pulmonary disease) ([place]); Coronary artery disease; DVT (deep venous thrombosis) ([place]); GERD  (gastroesophageal reflux disease); Headache; Hyperlipidemia; Hypertension; Migraines; Neuropathy (2010); Osteoporosis; Oxygen deficiency; Peripheral vascular disease ([place]); Pneumonia; Pneumonia (11/19/2015); Pneumonia (10/2016); Pneumonia; and Vitamin D deficiency. Surgical: Ms. Circle  has a past surgical history that includes Appendectomy; Spine surgery; Foot surgery (Bilateral); cardiac catherization (10/31/2009); abdomnal aortic stent (05/30/2008); Abdominal hysterectomy (1975); Cholecystectomy (1972); Cervical fusion (C5 - 6/C6-7); Appendectomy; Colonoscopy with propofol (N/A, 07/27/2015); and Esophagogastroduodenoscopy (  egd) with propofol (N/A, 07/27/2015). Family: family history includes Arthritis in her mother; Cancer in her brother, brother, and mother; Diabetes in her brother, mother, and sister; Heart disease in her father and mother.  Constitutional Exam  General appearance: Well nourished, well developed, and well hydrated. In no apparent acute distress Vitals:   09/23/17 1045  BP: (!) 163/69  Pulse: 84  Resp: 16  Temp: 97.6 F (36.4 C)  SpO2: 97%  Weight: 93 lb (42.2 kg)  Height: _0  (1.473 m)   BMI Assessment: Estimated body mass index is 19.44 kg/m as calculated from the following:   Height as of this encounter: _1  (1.473 m).   Weight as of this encounter: 93 lb (42.2 kg). Psych/Mental status: Alert, oriented x 3 (person, place, & time)       Eyes: PERLA Respiratory: No evidence of acute respiratory distress  Cervical Spine Area Exam  Skin & Axial Inspection: No masses, redness, edema, swelling, or associated skin lesions Alignment: Symmetrical Functional ROM: Unrestricted ROM      Stability: No instability detected Muscle Tone/Strength: Functionally intact. No obvious neuro-muscular anomalies detected. Sensory (Neurological): Unimpaired Palpation: No palpable anomalies              Upper Extremity (UE) Exam    Side: Right upper extremity  Side: Left upper  extremity  Skin & Extremity Inspection: Skin color, temperature, and hair growth are WNL. No peripheral edema or cyanosis. No masses, redness, swelling, asymmetry, or associated skin lesions. No contractures.  Skin & Extremity Inspection: Skin color, temperature, and hair growth are WNL. No peripheral edema or cyanosis. No masses, redness, swelling, asymmetry, or associated skin lesions. No contractures.  Functional ROM: Unrestricted ROM          Functional ROM: Unrestricted ROM          Muscle Tone/Strength: Functionally intact. No obvious neuro-muscular anomalies detected.  Muscle Tone/Strength: Functionally intact. No obvious neuro-muscular anomalies detected.  Sensory (Neurological): Unimpaired          Sensory (Neurological): Unimpaired          Palpation: No palpable anomalies              Palpation: No palpable anomalies              Specialized Test(s): Deferred         Specialized Test(s): Deferred          Thoracic Spine Area Exam  Skin & Axial Inspection: No masses, redness, or swelling Alignment: Symmetrical Functional ROM: Unrestricted ROM Stability: No instability detected Muscle Tone/Strength: Functionally intact. No obvious neuro-muscular anomalies detected. Sensory (Neurological): Unimpaired Muscle strength & Tone: No palpable anomalies  Lumbar Spine Area Exam  Skin & Axial Inspection: No masses, redness, or swelling Alignment: Asymmetric Functional ROM: Unrestricted ROM      Stability: Possibly unstable Palpation: Complains of area being tender to palpation       Provocative Tests: Lumbar Hyperextension and rotation test: evaluation deferred today       Lumbar Lateral bending test: evaluation deferred today       Patrick's Maneuver: evaluation deferred today                    Gait & Posture Assessment  Ambulation: Patient ambulates using a walker Gait: Relatively normal for age and body habitus Posture: Kyphosis-lordosis   Lower Extremity Exam    Side: Right lower  extremity  Side: Left lower extremity  Skin & Extremity Inspection:  Skin color, temperature, and hair growth are WNL. No peripheral edema or cyanosis. No masses, redness, swelling, asymmetry, or associated skin lesions. No contractures.  Skin & Extremity Inspection: Skin color, temperature, and hair growth are WNL. No peripheral edema or cyanosis. No masses, redness, swelling, asymmetry, or associated skin lesions. No contractures.  Functional ROM: Unrestricted ROM          Functional ROM: Unrestricted ROM          Muscle Tone/Strength: Functionally intact. No obvious neuro-muscular anomalies detected.  Muscle Tone/Strength: Functionally intact. No obvious neuro-muscular anomalies detected.  Sensory (Neurological): Unimpaired  Sensory (Neurological): Unimpaired  Palpation: No palpable anomalies  Palpation: No palpable anomalies   Assessment  Primary Diagnosis & Pertinent Problem List: The primary encounter diagnosis was Chronic low back pain (Location of Primary Source of Pain) (Bilateral) (L>R). Diagnoses of Lumbar facet syndrome (Location of Primary Source of Pain) (Bilateral) (L>R), Lumbar spondylosis, and Chronic pain syndrome were also pertinent to this visit.  Status Diagnosis  Controlled Controlled Controlled 1. Chronic low back pain (Location of Primary Source of Pain) (Bilateral) (L>R)   2. Lumbar facet syndrome (Location of Primary Source of Pain) (Bilateral) (L>R)   3. Lumbar spondylosis   4. Chronic pain syndrome     Problems updated and reviewed during this visit: No problems updated. Plan of Care  Pharmacotherapy (Medications Ordered): Meds ordered this encounter  Medications  . traMADol (ULTRAM) 50 MG tablet    Sig: Take 2 tablets (100 mg total) by mouth every 6 (six) hours as needed for moderate pain or severe pain.    Dispense:  240 tablet    Refill:  0    Patient may have prescription filled one day early if pharmacy is closed on scheduled refill date. Do not fill  until: 10/19/2017 To last until: 11/18/2017    Order Specific Question:   Supervising Provider    Answer:   Milinda Pointer [phone]   New Prescriptions   No medications on file   Medications administered today: Ms. Badders had no medications administered during this visit. Lab-work, procedure(s), and/or referral(s): No orders of the defined types were placed in this encounter.  Imaging and/or referral(s): None  Interventional therapies: Planned, scheduled, and/or pending:  No fentanyl refilled today (2 prescriptions at the pharmacy)   Considering:  Bilateral lumbar facet radiofrequency ablation    Palliative PRN treatment(s):  Palliative bilateral lumbar facet block under fluoroscopic guidance and IV sedation     Provider-requested follow-up: Return in about 7 weeks (around 11/11/2017) for MedMgmt.  Future Appointments Date Time Provider [place]  09/25/2017 11:30 AM Christene Lye, MD [facility] None  10/07/2017 11:00 AM Vevelyn Francois, NP [facility] None  11/11/2017 10:45 AM Vevelyn Francois, NP [facility] None   Primary Care Physician: Birdie Sons, MD Location: [facility] Note by: Vevelyn Francois NP Date: 09/23/2017; Time: 3:28 PM  Pain Score Disclaimer: We use the NRS-11 scale. This is a self-reported, subjective measurement of pain severity with only modest accuracy. It is used primarily to identify changes within a particular patient. It must be understood that outpatient pain scales are significantly less accurate that those used for research, where they can be applied under ideal controlled circumstances with minimal exposure to variables. In reality, the score is likely to be a combination of pain intensity and pain affect, where pain affect describes the degree of emotional arousal or changes in action readiness caused by the sensory experience of  pain. Factors such as social and work situation, setting,  emotional state, anxiety levels, expectation, and prior pain experience may influence pain perception and show large inter-individual differences that may also be affected by time variables.  Patient instructions provided during this appointment: Patient Instructions   ____________________________________________________________________________________________  Medication Rules  Applies to: All patients receiving prescriptions (written or electronic).  Pharmacy of record: Pharmacy where electronic prescriptions will be sent. If written prescriptions are taken to a different pharmacy, please inform the nursing staff. The pharmacy listed in the electronic medical record should be the one where you would like electronic prescriptions to be sent.  Prescription refills: Only during scheduled appointments. Applies to both, written and electronic prescriptions.  NOTE: The following applies primarily to controlled substances (Opioid* Pain Medications).   Patient's responsibilities: 1. Pain Pills: Bring all pain pills to every appointment (except for procedure appointments). 2. Pill Bottles: Bring pills in original pharmacy bottle. Always bring newest bottle. Bring bottle, even if empty. 3. Medication refills: You are responsible for knowing and keeping track of what medications you need refilled. The day before your appointment, write a list of all prescriptions that need to be refilled. Bring that list to your appointment and give it to the admitting nurse. Prescriptions will be written only during appointments. If you forget a medication, it will not be "Called in", "Faxed", or "electronically sent". You will need to get another appointment to get these prescribed. 4. Prescription Accuracy: You are responsible for carefully inspecting your prescriptions before leaving our office. Have the discharge nurse carefully go over each prescription with you, before taking them home. Make sure that your name is  accurately spelled, that your address is correct. Check the name and dose of your medication to make sure it is accurate. Check the number of pills, and the written instructions to make sure they are clear and accurate. Make sure that you are given enough medication to last until your next medication refill appointment. 5. Taking Medication: Take medication as prescribed. Never take more pills than instructed. Never take medication more frequently than prescribed. Taking less pills or less frequently is permitted and encouraged, when it comes to controlled substances (written prescriptions).  6. Inform other Doctors: Always inform, all of your healthcare providers, of all the medications you take. 7. Pain Medication from other Providers: You are not allowed to accept any additional pain medication from any other Doctor or Healthcare provider. There are two exceptions to this rule. (see below) In the event that you require additional pain medication, you are responsible for notifying us, as stated below. 8. Medication Agreement: You are responsible for carefully reading and following our Medication Agreement. This must be signed before receiving any prescriptions from our practice. Safely store a copy of your signed Agreement. Violations to the Agreement will result in no further prescriptions. (Additional copies of our Medication Agreement are available upon request.) 9. Laws, Rules, & Regulations: All patients are expected to follow all Federal and [employer], Rules, [employer]. Ignorance of the Laws does not constitute a valid excuse. The use of any illegal substances is prohibited. 10. Adopted CDC guidelines & recommendations: Target dosing levels will be at or below 60 MME/day. Use of benzodiazepines** is not recommended.  Exceptions: There are only two exceptions to the rule of not receiving pain medications from other Healthcare Providers. 1. Exception #1 (Emergencies): In the event of an  emergency (i.e.: accident requiring emergency care), you are allowed to receive additional pain  medication. However, you are responsible for: As soon as you are able, call our office [phone], at any time of the day or night, and leave a message stating your name, the date and nature of the emergency, and the name and dose of the medication prescribed. In the event that your call is answered by a member of our staff, make sure to document and save the date, time, and the name of the person that took your information.  2. Exception #2 (Planned Surgery): In the event that you are scheduled by another doctor or dentist to have any type of surgery or procedure, you are allowed (for a period no longer than 30 days), to receive additional pain medication, for the acute post-op pain. However, in this case, you are responsible for picking up a copy of our "Post-op Pain Management for Surgeons" handout, and giving it to your surgeon or dentist. This document is available at our office, and does not require an appointment to obtain it. Simply go to our office during business hours (Monday-Thursday from 8:00 AM to 4:00 PM) (Friday 8:00 AM to 12:00 Noon) or if you have a scheduled appointment with [place], prior to your surgery, and ask for it by name. In addition, you will need to provide [place] with your name, name of your surgeon, type of surgery, and date of procedure or surgery.  *Opioid medications include: morphine, codeine, oxycodone, oxymorphone, hydrocodone, hydromorphone, meperidine, tramadol, tapentadol, buprenorphine, fentanyl, methadone. **Benzodiazepine medications include: diazepam (Valium), alprazolam (Xanax), clonazepam (Klonopine), lorazepam (Ativan), clorazepate (Tranxene), chlordiazepoxide (Librium), estazolam (Prosom), oxazepam (Serax), temazepam (Restoril), triazolam (Halcion)  ____________________________________________________________________________________________

## 2017-09-23 NOTE — Patient Instructions (Signed)

## 2017-09-25 ENCOUNTER — Ambulatory Visit
Admission: RE | Admit: 2017-09-25 | Discharge: 2017-09-25 | Disposition: A | Discharge status: Home / Self Care | Payer: Medicare HMO | Source: Ambulatory Visit | Attending: General Surgery | Admitting: General Surgery

## 2017-09-25 ENCOUNTER — Encounter: Payer: Self-pay | Admitting: General Surgery

## 2017-09-25 ENCOUNTER — Telehealth: Payer: Self-pay | Admitting: *Deleted

## 2017-09-25 ENCOUNTER — Ambulatory Visit (INDEPENDENT_AMBULATORY_CARE_PROVIDER_SITE_OTHER): Payer: Medicare HMO | Admitting: General Surgery

## 2017-09-25 VITALS — BP 130/70 | HR 74 | Resp 16 | Ht <= 58 in | Wt 92.0 lb

## 2017-09-25 DIAGNOSIS — S2241XA Multiple fractures of ribs, right side, initial encounter for closed fracture: Secondary | ICD-10-CM

## 2017-09-25 DIAGNOSIS — S2231XA Fracture of one rib, right side, initial encounter for closed fracture: Secondary | ICD-10-CM | POA: Insufficient documentation

## 2017-09-25 DIAGNOSIS — X58XXXA Exposure to other specified factors, initial encounter: Secondary | ICD-10-CM | POA: Diagnosis not present

## 2017-09-25 DIAGNOSIS — R079 Chest pain, unspecified: Secondary | ICD-10-CM | POA: Diagnosis not present

## 2017-09-25 NOTE — Telephone Encounter (Signed)
Notified patient granddaughter, as instructed, patient pleased. Discussed follow-up appointments, patient agrees

## 2017-09-25 NOTE — Progress Notes (Signed)
Patient ID: Cheryl [name], female   DOB: 09-Sep-1951, 66 y.o.   MRN: [id number]  Chief Complaint  Patient presents with  . Other    HPI Cheryl [name] is a 66 y.o. female.  She is here for evaluation of displaced right rib fracture. She 'just slipped' and fell on her right side onto the floor 09-16-17. She has neuropathy in her feet and falls often. She typically uses a walker. She is having some rib pain on the right side that is exacerbated by twisting movements and taking deep breaths. She denies difficulty breathing or shortness of breath. She is here with her husband. Rib x ray was 09-19-17.  HPI  Past Medical History:  Diagnosis Date  . Allergy   . Anxiety   . Arthritis   . Aspiration pneumonitis ([place]) 11/24/2015  . Asthma   . Chronic pain   . COPD (chronic obstructive pulmonary disease) ([place])   . Coronary artery disease   . DVT (deep venous thrombosis) ([place])   . GERD (gastroesophageal reflux disease)   . Headache   . Hyperlipidemia   . Hypertension   . Kyphoscoliosis deformity of spine   . Migraines   . Neuropathy 2010  . Osteoporosis   . Oxygen deficiency   . Peripheral vascular disease ([place])   . Pneumonia   . Pneumonia 11/19/2015  . Pneumonia 10/2016  . Pneumonia    aspiration 2018- 4 times in last year  . Vitamin D deficiency     Past Surgical History:  Procedure Laterality Date  . ABDOMINAL HYSTERECTOMY  1975   Bilaterl Oophorectomy; [place] to IUD infection  . abdomnal aortic stent  05/30/2008   Dr. Quay Burow  . APPENDECTOMY    . APPENDECTOMY    . cardiac catherization  10/31/2009  . CERVICAL FUSION  C5 - 6/C6-7  . CHOLECYSTECTOMY  1972  . COLONOSCOPY WITH PROPOFOL N/A 07/27/2015   Procedure: COLONOSCOPY WITH PROPOFOL;  Surgeon: Hulen Luster, MD;  Location: [facility] ENDOSCOPY;  Service: Gastroenterology;  Laterality: N/A;  . ESOPHAGOGASTRODUODENOSCOPY (EGD) WITH PROPOFOL N/A 07/27/2015   Procedure: ESOPHAGOGASTRODUODENOSCOPY (EGD) WITH PROPOFOL;  Surgeon:  Hulen Luster, MD;  Location: [facility] ENDOSCOPY;  Service: Gastroenterology;  Laterality: N/A;  . FOOT SURGERY Bilateral    5-6 years per patient  . SPINE SURGERY      Family History  Problem Relation Age of Onset  . Cancer Mother   . Arthritis Mother   . Heart disease Mother   . Diabetes Mother        mellitus, type 2  . Heart disease Father   . Diabetes Sister   . Cancer Brother   . Cancer Brother        lung  . Diabetes Brother     Social History Social History  Substance Use Topics  . Smoking status: Current Every Day Smoker    Packs/day: 0.50    Years: 50.00    Types: Cigarettes  . Smokeless tobacco: Never Used     Comment: Previously smoked 2 ppd  . Alcohol use No    Allergies  Allergen Reactions  . Percocet [Oxycodone-Acetaminophen] Hives and Rash  . Aspirin Nausea And Vomiting and Other (See Comments)    Reaction:  GI upset   . Codeine Nausea And Vomiting, Other (See Comments) and Nausea Only    Reaction:  GI upset   . Propoxyphene Other (See Comments) and Nausea Only    GI upset Reaction:  GI upset   .  Sulfa Antibiotics Rash and Other (See Comments)    Reaction:  GI upset     Current Outpatient Prescriptions  Medication Sig Dispense Refill  . acetaminophen (TYLENOL) 325 MG tablet Take 2 tablets (650 mg total) by mouth every 6 (six) hours as needed for mild pain (or Fever >/= 101). 30 tablet 0  . ADVAIR DISKUS 250-50 MCG/DOSE AEPB inhale 1 dose by mouth twice a day 60 each 1  . albuterol (PROVENTIL HFA;VENTOLIN HFA) 108 (90 BASE) MCG/ACT inhaler Inhale 2 puffs into the lungs every 4 (four) hours as needed for wheezing or shortness of breath. 18 g 3  . albuterol (PROVENTIL) (2.5 MG/3ML) 0.083% nebulizer solution Take 3 mLs (2.5 mg total) by nebulization every 4 (four) hours as needed for wheezing. 75 mL 12  . alendronate (FOSAMAX) 70 MG tablet Take 1 tablet (70 mg total) by mouth every 7 (seven) days. Take with a full glass of water on an empty stomach. 4 tablet  11  . ALPRAZolam (XANAX) 0.5 MG tablet Take 1 tablet (0.5 mg total) by mouth 3 (three) times daily as needed for anxiety. (Patient taking differently: Take 0.5 mg by mouth every hour as needed for anxiety. ) 60 tablet 3  . celecoxib (CELEBREX) 100 MG capsule Take 1 capsule (100 mg total) by mouth 2 (two) times daily as needed. 60 capsule 1  . clopidogrel (PLAVIX) 75 MG tablet Take 75 mg by mouth daily.    . diphenoxylate-atropine (LOMOTIL) 2.5-0.025 MG per tablet Take 2 tablets by mouth 4 (four) times daily as needed for diarrhea or loose stools. Reported on 03/11/2016    . donepezil (ARICEPT) 10 MG tablet Take 10 mg by mouth at bedtime.    Marland Kitchen estropipate (OGEN) 0.75 MG tablet take 1 tablet by mouth every other day 30 tablet 3  . fentaNYL (DURAGESIC - DOSED MCG/HR) 50 MCG/HR Place 1 patch (50 mcg total) onto the skin every 3 (three) days. 10 patch 0  . [START ON 10/01/2017] fentaNYL (DURAGESIC - DOSED MCG/HR) 50 MCG/HR Place 1 patch (50 mcg total) onto the skin every 3 (three) days. 10 patch 0  . furosemide (LASIX) 40 MG tablet Take 40 mg by mouth every other day.     Marland Kitchen LYRICA 300 MG capsule Take 300 mg by mouth 2 (two) times daily.  0  . mupirocin ointment (BACTROBAN) 2 % Place 1 application into the nose 2 (two) times daily. 30 g 2  . NARCAN 4 MG/0.1ML LIQD nasal spray kit Place 0.4 mg into the nose once.     . pantoprazole (PROTONIX) 40 MG tablet take 1 tablet by mouth twice a day (Patient taking differently: Take 40 mg by mouth once daily) 60 tablet 12  . simvastatin (ZOCOR) 10 MG tablet Take 10 mg by mouth at bedtime. Reported on 12/04/2015    . SPIRIVA HANDIHALER 18 MCG inhalation capsule inhale the contents of one capsule in the handihaler once daily 30 capsule 12  . SUMAtriptan (IMITREX) 25 MG tablet Take 25 mg by mouth as needed for migraine. May repeat in 2 hours if headache persists or recurs.    Derrill Memo ON 10/19/2017] traMADol (ULTRAM) 50 MG tablet Take 2 tablets (100 mg total) by mouth  every 6 (six) hours as needed for moderate pain or severe pain. 240 tablet 0  . triamcinolone ointment (KENALOG) 0.5 % Apply to lesions on hands twice a day as needed 30 g 1  . fentaNYL (DURAGESIC - DOSED MCG/HR) 50 MCG/HR Place  1 patch (50 mcg total) onto the skin every 3 (three) days. 10 patch 0   No current facility-administered medications for this visit.     Review of Systems Review of Systems  Constitutional: Negative.   Respiratory: Negative.   Cardiovascular: Negative.     Blood pressure 130/70, pulse 74, resp. rate 16, height '4\' 10"'$  (1.473 m), weight 92 lb (41.7 kg).  Physical Exam Physical Exam  Constitutional: She is oriented to person, place, and time. She appears well-developed and well-nourished.  HENT:  Mouth/Throat: Oropharynx is clear and moist.  Eyes: Conjunctivae are normal. No scleral icterus.  Neck: Neck supple.  Cardiovascular: Normal rate, regular rhythm and normal heart sounds.   Pulmonary/Chest: Effort normal and breath sounds normal.      Musculoskeletal: She exhibits tenderness (with palpation along right rib step off).  Significant kyphoscoliosis with significantly reduced space between right iliac crest and inferior rib margin  Lymphadenopathy:    She has no cervical adenopathy.  Neurological: She is alert and oriented to person, place, and time.  Skin: Skin is warm and dry.  Psychiatric: Her behavior is normal.    Data Reviewed Xray, previous notes XR revealed acute displaced fracture of the right ninth rib, old fractures of right 8th-10th. Right lung clear.  Assessment    Displaced right ninth rib fracture. Denies symptoms of respiratory compromise, physical exam is stable. No indications for surgical intervention at this time. Would not recommend brace. Repeat chest xr to verify no progressive lung involvement.     Plan    Chest x ray today Follow up in 4 weeks  Discussed treatment plan with patient and answered questions regarding  brace. Encouraged patient to rest and minimize her risk of future falls. Also discussed prevention measures for cold/flu as coughing would exacerbate her pain symptoms. She will call if she has any additional questions.      HPI, Physical Exam, Assessment and Plan have been scribed under the direction and in the presence of Mckinley Jewel, MD Karie Fetch, RN  I have completed the exam and reviewed the above documentation for accuracy and completeness.  I agree with the above.  Haematologist has been used and any errors in dictation or transcription are unintentional.  Arlynn Stare G. Jamal Collin, M.D., F.A.C.S.  Junie Panning G 09/26/2017, 10:01 AM  Patient was sent to [facility] to have a chest x-ray PA and lateral done today.   [place]a [place], CMA

## 2017-09-25 NOTE — Telephone Encounter (Signed)
-----   Message from Kieth BrightlySeeplaputhur G Sankar, MD sent at 09/25/2017  4:54 PM EDT ----- Please inform pt- Chest xray showed no lung injury. No change from last xray

## 2017-09-25 NOTE — Patient Instructions (Signed)
The patient is aware to call back for any questions or concerns.  

## 2017-09-30 DIAGNOSIS — J9611 Chronic respiratory failure with hypoxia: Secondary | ICD-10-CM | POA: Diagnosis not present

## 2017-09-30 DIAGNOSIS — I2510 Atherosclerotic heart disease of native coronary artery without angina pectoris: Secondary | ICD-10-CM | POA: Diagnosis not present

## 2017-09-30 DIAGNOSIS — I10 Essential (primary) hypertension: Secondary | ICD-10-CM | POA: Diagnosis not present

## 2017-09-30 DIAGNOSIS — I739 Peripheral vascular disease, unspecified: Secondary | ICD-10-CM | POA: Diagnosis not present

## 2017-09-30 DIAGNOSIS — G8929 Other chronic pain: Secondary | ICD-10-CM | POA: Diagnosis not present

## 2017-09-30 DIAGNOSIS — M810 Age-related osteoporosis without current pathological fracture: Secondary | ICD-10-CM | POA: Diagnosis not present

## 2017-09-30 DIAGNOSIS — J441 Chronic obstructive pulmonary disease with (acute) exacerbation: Secondary | ICD-10-CM | POA: Diagnosis not present

## 2017-09-30 DIAGNOSIS — G629 Polyneuropathy, unspecified: Secondary | ICD-10-CM | POA: Diagnosis not present

## 2017-09-30 DIAGNOSIS — K219 Gastro-esophageal reflux disease without esophagitis: Secondary | ICD-10-CM | POA: Diagnosis not present

## 2017-10-06 DIAGNOSIS — J441 Chronic obstructive pulmonary disease with (acute) exacerbation: Secondary | ICD-10-CM | POA: Diagnosis not present

## 2017-10-06 DIAGNOSIS — I2510 Atherosclerotic heart disease of native coronary artery without angina pectoris: Secondary | ICD-10-CM | POA: Diagnosis not present

## 2017-10-06 DIAGNOSIS — J9611 Chronic respiratory failure with hypoxia: Secondary | ICD-10-CM | POA: Diagnosis not present

## 2017-10-06 DIAGNOSIS — I10 Essential (primary) hypertension: Secondary | ICD-10-CM | POA: Diagnosis not present

## 2017-10-07 ENCOUNTER — Ambulatory Visit: Payer: Medicare HMO | Admitting: Nurse Practitioner

## 2017-10-14 DIAGNOSIS — J449 Chronic obstructive pulmonary disease, unspecified: Secondary | ICD-10-CM | POA: Diagnosis not present

## 2017-10-15 DIAGNOSIS — K219 Gastro-esophageal reflux disease without esophagitis: Secondary | ICD-10-CM | POA: Diagnosis not present

## 2017-10-15 DIAGNOSIS — I10 Essential (primary) hypertension: Secondary | ICD-10-CM | POA: Diagnosis not present

## 2017-10-15 DIAGNOSIS — J9611 Chronic respiratory failure with hypoxia: Secondary | ICD-10-CM | POA: Diagnosis not present

## 2017-10-15 DIAGNOSIS — G629 Polyneuropathy, unspecified: Secondary | ICD-10-CM | POA: Diagnosis not present

## 2017-10-15 DIAGNOSIS — M810 Age-related osteoporosis without current pathological fracture: Secondary | ICD-10-CM | POA: Diagnosis not present

## 2017-10-15 DIAGNOSIS — J441 Chronic obstructive pulmonary disease with (acute) exacerbation: Secondary | ICD-10-CM | POA: Diagnosis not present

## 2017-10-15 DIAGNOSIS — I739 Peripheral vascular disease, unspecified: Secondary | ICD-10-CM | POA: Diagnosis not present

## 2017-10-15 DIAGNOSIS — I2510 Atherosclerotic heart disease of native coronary artery without angina pectoris: Secondary | ICD-10-CM | POA: Diagnosis not present

## 2017-10-15 DIAGNOSIS — G8929 Other chronic pain: Secondary | ICD-10-CM | POA: Diagnosis not present

## 2017-10-16 DIAGNOSIS — J449 Chronic obstructive pulmonary disease, unspecified: Secondary | ICD-10-CM | POA: Diagnosis not present

## 2017-10-20 ENCOUNTER — Other Ambulatory Visit: Payer: Self-pay | Admitting: Family Medicine

## 2017-10-23 ENCOUNTER — Ambulatory Visit (INDEPENDENT_AMBULATORY_CARE_PROVIDER_SITE_OTHER): Payer: Medicare HMO | Admitting: General Surgery

## 2017-10-23 ENCOUNTER — Encounter: Payer: Self-pay | Admitting: General Surgery

## 2017-10-23 VITALS — BP 128/72 | HR 74 | Resp 14 | Ht <= 58 in | Wt 98.0 lb

## 2017-10-23 DIAGNOSIS — S2241XA Multiple fractures of ribs, right side, initial encounter for closed fracture: Secondary | ICD-10-CM | POA: Diagnosis not present

## 2017-10-23 NOTE — Patient Instructions (Signed)
Patient to return as needed. The patient is aware to call back for any questions or concerns. 

## 2017-10-23 NOTE — Progress Notes (Signed)
Patient ID: Cheryl [name], female   DOB: [birth year], 66 y.o.   MRN: [id number]  Chief Complaint  Patient presents with  . Follow-up    HPI Cheryl [name] is a 66 y.o. female here today for her one month follow up rib fracture. Rib pain improved since last visit.   HPI  Past Medical History:  Diagnosis Date  . Allergy   . Anxiety   . Arthritis   . Aspiration pneumonitis ([place]) 11/24/2015  . Asthma   . Chronic pain   . COPD (chronic obstructive pulmonary disease) ([place])   . Coronary artery disease   . DVT (deep venous thrombosis) ([place])   . GERD (gastroesophageal reflux disease)   . Headache   . Hyperlipidemia   . Hypertension   . Kyphoscoliosis deformity of spine   . Migraines   . Neuropathy 2010  . Osteoporosis   . Oxygen deficiency   . Peripheral vascular disease ([place])   . Pneumonia   . Pneumonia 11/19/2015  . Pneumonia 10/2016  . Pneumonia    aspiration 2018- 4 times in last year  . Vitamin D deficiency     Past Surgical History:  Procedure Laterality Date  . ABDOMINAL HYSTERECTOMY  1975   Bilaterl Oophorectomy; [place] to IUD infection  . abdomnal aortic stent  05/30/2008   Dr. Quay Burow  . APPENDECTOMY    . APPENDECTOMY    . cardiac catherization  10/31/2009  . CERVICAL FUSION  C5 - 6/C6-7  . CHOLECYSTECTOMY  1972  . COLONOSCOPY WITH PROPOFOL N/A 07/27/2015   Procedure: COLONOSCOPY WITH PROPOFOL;  Surgeon: Hulen Luster, MD;  Location: [facility] ENDOSCOPY;  Service: Gastroenterology;  Laterality: N/A;  . ESOPHAGOGASTRODUODENOSCOPY (EGD) WITH PROPOFOL N/A 07/27/2015   Procedure: ESOPHAGOGASTRODUODENOSCOPY (EGD) WITH PROPOFOL;  Surgeon: Hulen Luster, MD;  Location: [facility] ENDOSCOPY;  Service: Gastroenterology;  Laterality: N/A;  . FOOT SURGERY Bilateral    5-6 years per patient  . SPINE SURGERY      Family History  Problem Relation Age of Onset  . Cancer Mother   . Arthritis Mother   . Heart disease Mother   . Diabetes Mother        mellitus, type 2  . Heart  disease Father   . Diabetes Sister   . Cancer Brother   . Cancer Brother        lung  . Diabetes Brother     Social History Social History   Tobacco Use  . Smoking status: Current Every Day Smoker    Packs/day: 0.50    Years: 50.00    Pack years: 25.00    Types: Cigarettes  . Smokeless tobacco: Never Used  . Tobacco comment: Previously smoked 2 ppd  Substance Use Topics  . Alcohol use: No    Alcohol/week: 0.0 oz  . Drug use: No    Allergies  Allergen Reactions  . Percocet [Oxycodone-Acetaminophen] Hives and Rash  . Aspirin Nausea And Vomiting and Other (See Comments)    Reaction:  GI upset   . Codeine Nausea And Vomiting, Other (See Comments) and Nausea Only    Reaction:  GI upset   . Propoxyphene Other (See Comments) and Nausea Only    GI upset Reaction:  GI upset   . Sulfa Antibiotics Rash and Other (See Comments)    Reaction:  GI upset     Current Outpatient Medications  Medication Sig Dispense Refill  . acetaminophen (TYLENOL) 325 MG tablet Take 2 tablets (650 mg total) by  mouth every 6 (six) hours as needed for mild pain (or Fever >/= 101). 30 tablet 0  . ADVAIR DISKUS 250-50 MCG/DOSE AEPB inhale 1 dose by mouth twice a day 60 each 5  . albuterol (PROVENTIL HFA;VENTOLIN HFA) 108 (90 BASE) MCG/ACT inhaler Inhale 2 puffs into the lungs every 4 (four) hours as needed for wheezing or shortness of breath. 18 g 3  . albuterol (PROVENTIL) (2.5 MG/3ML) 0.083% nebulizer solution Take 3 mLs (2.5 mg total) by nebulization every 4 (four) hours as needed for wheezing. 75 mL 12  . alendronate (FOSAMAX) 70 MG tablet Take 1 tablet (70 mg total) by mouth every 7 (seven) days. Take with a full glass of water on an empty stomach. 4 tablet 11  . ALPRAZolam (XANAX) 0.5 MG tablet Take 1 tablet (0.5 mg total) by mouth 3 (three) times daily as needed for anxiety. 60 tablet 3  . celecoxib (CELEBREX) 100 MG capsule Take 1 capsule (100 mg total) by mouth 2 (two) times daily as needed. 60  capsule 1  . clopidogrel (PLAVIX) 75 MG tablet Take 75 mg by mouth daily.    . diphenoxylate-atropine (LOMOTIL) 2.5-0.025 MG per tablet Take 2 tablets by mouth 4 (four) times daily as needed for diarrhea or loose stools. Reported on 03/11/2016    . donepezil (ARICEPT) 10 MG tablet Take 10 mg by mouth at bedtime.    Marland Kitchen estropipate (OGEN) 0.75 MG tablet take 1 tablet by mouth every other day 30 tablet 3  . fentaNYL (DURAGESIC - DOSED MCG/HR) 50 MCG/HR Place 1 patch (50 mcg total) onto the skin every 3 (three) days. 10 patch 0  . furosemide (LASIX) 40 MG tablet Take 40 mg by mouth every other day.     Marland Kitchen LYRICA 300 MG capsule Take 300 mg by mouth 2 (two) times daily.  0  . mupirocin ointment (BACTROBAN) 2 % Place 1 application into the nose 2 (two) times daily. 30 g 2  . NARCAN 4 MG/0.1ML LIQD nasal spray kit Place 0.4 mg into the nose once.     . pantoprazole (PROTONIX) 40 MG tablet take 1 tablet by mouth twice a day 60 tablet 12  . simvastatin (ZOCOR) 10 MG tablet Take 10 mg by mouth at bedtime. Reported on 12/04/2015    . SPIRIVA HANDIHALER 18 MCG inhalation capsule inhale the contents of one capsule in the handihaler once daily 30 capsule 12  . SUMAtriptan (IMITREX) 25 MG tablet Take 25 mg by mouth as needed for migraine. May repeat in 2 hours if headache persists or recurs.    . traMADol (ULTRAM) 50 MG tablet Take 2 tablets (100 mg total) by mouth every 6 (six) hours as needed for moderate pain or severe pain. 240 tablet 0  . triamcinolone ointment (KENALOG) 0.5 % Apply to lesions on hands twice a day as needed 30 g 1  . fentaNYL (DURAGESIC - DOSED MCG/HR) 50 MCG/HR Place 1 patch (50 mcg total) onto the skin every 3 (three) days. 10 patch 0  . fentaNYL (DURAGESIC - DOSED MCG/HR) 50 MCG/HR Place 1 patch (50 mcg total) onto the skin every 3 (three) days. 10 patch 0   No current facility-administered medications for this visit.     Review of Systems Review of Systems  Constitutional: Negative.    Respiratory: Negative.   Cardiovascular: Negative.     Blood pressure 128/72, pulse 74, resp. rate 14, height _0  (1.473 m), weight 98 lb (44.5 kg), SpO2 96 %.  Physical Exam Physical Exam  Constitutional: She is oriented to person, place, and time. She appears well-developed and well-nourished.  Pulmonary/Chest: Effort normal and breath sounds normal.    Significant kyphoscoliosis, the right lowest ribs almost sitting on the iliac crest.  Neurological: She is alert and oriented to person, place, and time.  Skin: Skin is warm and dry.  Psychiatric: Her behavior is normal.    Data Reviewed [place] notes reviewed  Follow-up chest x-ray obtained after her last visit showed no evidence of lung injury Assessment    Solitary ninth rib fracture with minimal displacement on the right which appears to be stable with less pain now.     Plan   No further treatment required   Patient to return as needed. The patient is aware to call back for any questions or concerns.   HPI, Physical Exam, Assessment and Plan have been scribed under the direction and in the presence of Mckinley Jewel, MD  Gaspar Cola, CMA I have completed the exam and reviewed the above documentation for accuracy and completeness.  I agree with the above.  Haematologist has been used and any errors in dictation or transcription are unintentional.  Seeplaputhur G. Jamal Collin, M.D., F.A.C.S.   Junie Panning G 10/27/2017, 2:03 PM

## 2017-10-28 DIAGNOSIS — G8929 Other chronic pain: Secondary | ICD-10-CM | POA: Diagnosis not present

## 2017-10-28 DIAGNOSIS — G43019 Migraine without aura, intractable, without status migrainosus: Secondary | ICD-10-CM | POA: Diagnosis not present

## 2017-10-28 DIAGNOSIS — M810 Age-related osteoporosis without current pathological fracture: Secondary | ICD-10-CM | POA: Diagnosis not present

## 2017-10-28 DIAGNOSIS — I739 Peripheral vascular disease, unspecified: Secondary | ICD-10-CM | POA: Diagnosis not present

## 2017-10-28 DIAGNOSIS — J9611 Chronic respiratory failure with hypoxia: Secondary | ICD-10-CM | POA: Diagnosis not present

## 2017-10-28 DIAGNOSIS — G629 Polyneuropathy, unspecified: Secondary | ICD-10-CM | POA: Diagnosis not present

## 2017-10-28 DIAGNOSIS — K219 Gastro-esophageal reflux disease without esophagitis: Secondary | ICD-10-CM | POA: Diagnosis not present

## 2017-10-28 DIAGNOSIS — R296 Repeated falls: Secondary | ICD-10-CM | POA: Diagnosis not present

## 2017-10-28 DIAGNOSIS — G3184 Mild cognitive impairment, so stated: Secondary | ICD-10-CM | POA: Diagnosis not present

## 2017-10-28 DIAGNOSIS — I10 Essential (primary) hypertension: Secondary | ICD-10-CM | POA: Diagnosis not present

## 2017-10-28 DIAGNOSIS — G609 Hereditary and idiopathic neuropathy, unspecified: Secondary | ICD-10-CM | POA: Diagnosis not present

## 2017-10-28 DIAGNOSIS — J441 Chronic obstructive pulmonary disease with (acute) exacerbation: Secondary | ICD-10-CM | POA: Diagnosis not present

## 2017-10-28 DIAGNOSIS — I2510 Atherosclerotic heart disease of native coronary artery without angina pectoris: Secondary | ICD-10-CM | POA: Diagnosis not present

## 2017-10-30 DIAGNOSIS — I739 Peripheral vascular disease, unspecified: Secondary | ICD-10-CM | POA: Diagnosis not present

## 2017-10-30 DIAGNOSIS — G629 Polyneuropathy, unspecified: Secondary | ICD-10-CM | POA: Diagnosis not present

## 2017-10-30 DIAGNOSIS — I2510 Atherosclerotic heart disease of native coronary artery without angina pectoris: Secondary | ICD-10-CM | POA: Diagnosis not present

## 2017-10-30 DIAGNOSIS — M810 Age-related osteoporosis without current pathological fracture: Secondary | ICD-10-CM | POA: Diagnosis not present

## 2017-10-30 DIAGNOSIS — G8929 Other chronic pain: Secondary | ICD-10-CM | POA: Diagnosis not present

## 2017-10-30 DIAGNOSIS — I10 Essential (primary) hypertension: Secondary | ICD-10-CM | POA: Diagnosis not present

## 2017-10-30 DIAGNOSIS — J9611 Chronic respiratory failure with hypoxia: Secondary | ICD-10-CM | POA: Diagnosis not present

## 2017-11-11 ENCOUNTER — Encounter: Payer: Self-pay | Admitting: Nurse Practitioner

## 2017-11-11 ENCOUNTER — Ambulatory Visit: Payer: Medicare HMO | Attending: Nurse Practitioner | Admitting: Nurse Practitioner

## 2017-11-11 ENCOUNTER — Other Ambulatory Visit: Payer: Self-pay

## 2017-11-11 ENCOUNTER — Telehealth: Payer: Self-pay | Admitting: Family Medicine

## 2017-11-11 VITALS — BP 150/64 | HR 84 | Temp 97.8°F | Resp 16 | Ht <= 58 in | Wt 93.0 lb

## 2017-11-11 DIAGNOSIS — G8929 Other chronic pain: Secondary | ICD-10-CM

## 2017-11-11 DIAGNOSIS — E876 Hypokalemia: Secondary | ICD-10-CM | POA: Insufficient documentation

## 2017-11-11 DIAGNOSIS — R1310 Dysphagia, unspecified: Secondary | ICD-10-CM | POA: Diagnosis not present

## 2017-11-11 DIAGNOSIS — M810 Age-related osteoporosis without current pathological fracture: Secondary | ICD-10-CM | POA: Insufficient documentation

## 2017-11-11 DIAGNOSIS — J449 Chronic obstructive pulmonary disease, unspecified: Secondary | ICD-10-CM | POA: Diagnosis not present

## 2017-11-11 DIAGNOSIS — F419 Anxiety disorder, unspecified: Secondary | ICD-10-CM | POA: Diagnosis not present

## 2017-11-11 DIAGNOSIS — M5441 Lumbago with sciatica, right side: Secondary | ICD-10-CM | POA: Diagnosis not present

## 2017-11-11 DIAGNOSIS — I2510 Atherosclerotic heart disease of native coronary artery without angina pectoris: Secondary | ICD-10-CM | POA: Diagnosis not present

## 2017-11-11 DIAGNOSIS — E559 Vitamin D deficiency, unspecified: Secondary | ICD-10-CM | POA: Diagnosis not present

## 2017-11-11 DIAGNOSIS — G629 Polyneuropathy, unspecified: Secondary | ICD-10-CM | POA: Diagnosis not present

## 2017-11-11 DIAGNOSIS — R296 Repeated falls: Secondary | ICD-10-CM | POA: Diagnosis not present

## 2017-11-11 DIAGNOSIS — M5442 Lumbago with sciatica, left side: Secondary | ICD-10-CM | POA: Diagnosis not present

## 2017-11-11 DIAGNOSIS — Z7902 Long term (current) use of antithrombotics/antiplatelets: Secondary | ICD-10-CM | POA: Diagnosis not present

## 2017-11-11 DIAGNOSIS — I5031 Acute diastolic (congestive) heart failure: Secondary | ICD-10-CM | POA: Insufficient documentation

## 2017-11-11 DIAGNOSIS — G894 Chronic pain syndrome: Secondary | ICD-10-CM | POA: Diagnosis not present

## 2017-11-11 DIAGNOSIS — G4700 Insomnia, unspecified: Secondary | ICD-10-CM | POA: Diagnosis not present

## 2017-11-11 DIAGNOSIS — Z79899 Other long term (current) drug therapy: Secondary | ICD-10-CM | POA: Insufficient documentation

## 2017-11-11 DIAGNOSIS — D649 Anemia, unspecified: Secondary | ICD-10-CM | POA: Diagnosis not present

## 2017-11-11 DIAGNOSIS — E785 Hyperlipidemia, unspecified: Secondary | ICD-10-CM | POA: Diagnosis not present

## 2017-11-11 DIAGNOSIS — I10 Essential (primary) hypertension: Secondary | ICD-10-CM | POA: Diagnosis not present

## 2017-11-11 DIAGNOSIS — J9611 Chronic respiratory failure with hypoxia: Secondary | ICD-10-CM | POA: Diagnosis not present

## 2017-11-11 DIAGNOSIS — G8918 Other acute postprocedural pain: Secondary | ICD-10-CM | POA: Insufficient documentation

## 2017-11-11 DIAGNOSIS — K219 Gastro-esophageal reflux disease without esophagitis: Secondary | ICD-10-CM | POA: Diagnosis not present

## 2017-11-11 DIAGNOSIS — F1120 Opioid dependence, uncomplicated: Secondary | ICD-10-CM | POA: Diagnosis not present

## 2017-11-11 DIAGNOSIS — S2231XA Fracture of one rib, right side, initial encounter for closed fracture: Secondary | ICD-10-CM | POA: Insufficient documentation

## 2017-11-11 DIAGNOSIS — M47816 Spondylosis without myelopathy or radiculopathy, lumbar region: Secondary | ICD-10-CM | POA: Diagnosis not present

## 2017-11-11 DIAGNOSIS — E43 Unspecified severe protein-calorie malnutrition: Secondary | ICD-10-CM | POA: Diagnosis not present

## 2017-11-11 DIAGNOSIS — M797 Fibromyalgia: Secondary | ICD-10-CM | POA: Insufficient documentation

## 2017-11-11 DIAGNOSIS — Z7901 Long term (current) use of anticoagulants: Secondary | ICD-10-CM | POA: Diagnosis not present

## 2017-11-11 DIAGNOSIS — M545 Low back pain: Secondary | ICD-10-CM | POA: Insufficient documentation

## 2017-11-11 DIAGNOSIS — I739 Peripheral vascular disease, unspecified: Secondary | ICD-10-CM | POA: Diagnosis not present

## 2017-11-11 DIAGNOSIS — R112 Nausea with vomiting, unspecified: Secondary | ICD-10-CM | POA: Insufficient documentation

## 2017-11-11 DIAGNOSIS — F1290 Cannabis use, unspecified, uncomplicated: Secondary | ICD-10-CM | POA: Insufficient documentation

## 2017-11-11 DIAGNOSIS — Z885 Allergy status to narcotic agent status: Secondary | ICD-10-CM | POA: Insufficient documentation

## 2017-11-11 DIAGNOSIS — Z79891 Long term (current) use of opiate analgesic: Secondary | ICD-10-CM | POA: Insufficient documentation

## 2017-11-11 DIAGNOSIS — J441 Chronic obstructive pulmonary disease with (acute) exacerbation: Secondary | ICD-10-CM | POA: Diagnosis not present

## 2017-11-11 MED ORDER — FENTANYL 50 MCG/HR TD PT72: 50.0000 ug | MEDICATED_PATCH | TRANSDERMAL | 0 refills | Status: DC

## 2017-11-11 MED ORDER — TRAMADOL HCL 50 MG PO TABS: 100.0000 mg | ORAL_TABLET | Freq: Four times a day (QID) | ORAL | 2 refills | Status: DC | PRN

## 2017-11-11 NOTE — Progress Notes (Signed)
Nursing Pain Medication Assessment:  Safety precautions to be maintained throughout the outpatient stay will include: orient to surroundings, keep bed in low position, maintain call bell within reach at all times, provide assistance with transfer out of bed and ambulation.  Medication Inspection Compliance: Pill count conducted under aseptic conditions, in front of the patient. Neither the pills nor the bottle was removed from the patient's sight at any time. Once count was completed pills were immediately returned to the patient in their original bottle.  Medication #1: Fentanyl patch Pill/Patch Count: 4 of 10 patches remain Pill/Patch Appearance: Markings consistent with prescribed medication Bottle Appearance: Standard pharmacy container. Clearly labeled. Filled Date:11 / 30/ 2018 Last Medication intake:  Today  Medication #2: Tramadol (Ultram) Pill/Patch Count: 78 of 240 pills remain Pill/Patch Appearance: Markings consistent with prescribed medication Bottle Appearance: Standard pharmacy container. Clearly labeled. Filled Date: 3611 / 26/ 2018 Last Medication intake:  Today

## 2017-11-11 NOTE — Progress Notes (Signed)
Patient's Name: Cheryl [name]  MRN: [id number]  Referring Provider: Birdie Sons, MD  DOB: 04-Nov-1951  PCP: Birdie Sons, MD  DOS: 11/11/2017  Note by: Vevelyn Francois NP  Service setting: Ambulatory outpatient  Specialty: Interventional Pain Management  Location: [facility] (AMB) Pain Management Facility    Patient type: Established    Primary Reason(s) for Visit: Encounter for prescription drug management. (Level of risk: moderate)  CC: Back Pain (lower)  HPI  [name] is a 66 y.o. year old, female patient, who comes today for a medication management evaluation. She has Essential hypertension; GERD (gastroesophageal reflux disease); Hyperlipemia; Anxiety; Airway hyperreactivity; Back pain, thoracic; Coronary artery disease; Excessive falling; Alteration in bowel elimination: incontinence; Insomnia; Decreased testosterone level; Leg weakness; Menopausal symptom; Migraine; Neuropathy ([place]); Fecal occult blood test positive; OP (osteoporosis); Panic disorder; Compulsive tobacco user syndrome; Urinary incontinence; Vitamin D deficiency; Weight loss; COPD (chronic obstructive pulmonary disease) ([place]); Dementia; Lumbar radicular pain (Bilateral) (L>R) (L4); Chronic low back pain (Location of Primary Source of Pain) (Bilateral) (L>R); Encounter for therapeutic drug level monitoring; Uncomplicated opioid dependence ([place]); Chronic pain syndrome; Peripheral nerve disease; Peripheral vascular disease ([place]); Platelet inhibition due to Plavix; Abnormal mammogram of right breast; Dilated intrahepatic bile duct; Dysphagia; Acute diastolic CHF (congestive heart failure) ([place]); Leukocytosis; Long term current use of opiate analgesic; Fibromyalgia; Osteoarthrosis; Long term current use of anticoagulant therapy (Plavix); Opiate use (160 MME/Day); Anemia; Lumbar facet syndrome (Location of Primary Source of Pain) (Bilateral) (L>R); Lumbar spondylosis; Nausea with vomiting; Protein-calorie malnutrition, severe;  Metabolic encephalopathy; Hypokalemia; Acute postoperative pain; Pneumonia; Marijuana use; and Rib fracture on their problem list. Her primarily concern today is the Back Pain (lower)  Pain Assessment: Location: (lower) Back Onset: More than a month ago Duration: Chronic pain Quality: Throbbing Severity: 3 /10 (self-reported pain score)  Note: Reported level is compatible with observation.                          Effect on ADL: hard to do house work Timing: Constant Modifying factors: medications  Cheryl [name] was last scheduled for an appointment on 09/23/2017 for medication management. During today's appointment we reviewed Cheryl [name]'s chronic pain status, as well as her outpatient medication regimen. She is in today with her husband and granddaughter. She denies any concerns today.  The patient  reports that she does not use drugs. Her body mass index is 19.44 kg/m.  Further details on both, my assessment(s), as well as the proposed treatment plan, please see below.  Controlled Substance Pharmacotherapy Assessment REMS (Risk Evaluation and Mitigation Strategy)  Analgesic:Duragesic 50 mcg/h every 72 hours + Tramadol 100 mg PO q6hrs (400 mg/day) MME/day:160 mg/day.  Morley Kos, RN  11/11/2017 11:59 AM  Sign at close encounter Nursing Pain Medication Assessment:  Safety precautions to be maintained throughout the outpatient stay will include: orient to surroundings, keep bed in low position, maintain call bell within reach at all times, provide assistance with transfer out of bed and ambulation.  Medication Inspection Compliance: Pill count conducted under aseptic conditions, in front of the patient. Neither the pills nor the bottle was removed from the patient's sight at any time. Once count was completed pills were immediately returned to the patient in their original bottle.  Medication #1: Fentanyl patch Pill/Patch Count: 4 of 10 patches remain Pill/Patch Appearance: Markings  consistent with prescribed medication Bottle Appearance: Standard pharmacy container. Clearly labeled. Filled Date:11 / 30/ 2018 Last  Medication intake:  Today  Medication #2: Tramadol (Ultram) Pill/Patch Count: 78 of 240 pills remain Pill/Patch Appearance: Markings consistent with prescribed medication Bottle Appearance: Standard pharmacy container. Clearly labeled. Filled Date: 69 / 26/ 2018 Last Medication intake:  Today   Pharmacokinetics: Liberation and absorption (onset of action): WNL Distribution (time to peak effect): WNL Metabolism and excretion (duration of action): WNL         Pharmacodynamics: Desired effects: Analgesia: Cheryl [name] reports >50% benefit. Functional ability: Patient reports that medication allows her to accomplish basic ADLs Clinically meaningful improvement in function (CMIF): Sustained CMIF goals met Perceived effectiveness: Described as relatively effective, allowing for increase in activities of daily living (ADL) Undesirable effects: Side-effects or Adverse reactions: None reported Monitoring: [place] PMP: Online review of the past 14-monthperiod conducted. Compliant with practice rules and regulations Last UDS on record: Summary  Date Value Ref Range Status  06/05/2017 FINAL  Final    Comment:    ==================================================================== TOXASSURE SELECT 13 (MW) ==================================================================== Test                             Result       Flag       Units Drug Present and Declared for Prescription Verification   Fentanyl                       33           EXPECTED   ng/mg creat   Norfentanyl                    321          EXPECTED   ng/mg creat    Source of fentanyl is a scheduled prescription medication,    including IV, patch, and transmucosal formulations. Norfentanyl    is an expected metabolite of fentanyl.   Tramadol                       PRESENT      EXPECTED    O-Desmethyltramadol            PRESENT      EXPECTED   N-Desmethyltramadol            PRESENT      EXPECTED    Source of tramadol is a prescription medication.    O-desmethyltramadol and N-desmethyltramadol are expected    metabolites of tramadol. Drug Absent but Declared for Prescription Verification   Alprazolam                     Not Detected UNEXPECTED ng/mg creat ==================================================================== Test                      Result    Flag   Units      Ref Range   Creatinine              33               mg/dL      >=20 ==================================================================== Declared Medications:  The flagging and interpretation on this report are based on the  following declared medications.  Unexpected results may arise from  inaccuracies in the declared medications.  **Note: The testing scope of this panel includes these medications:  Alprazolam (Xanax)  Fentanyl  Tramadol  **Note: The testing scope of this panel does not  include following  reported medications:  Acetaminophen (Tylenol)  Albuterol  Atropine (Lomotil)  Celecoxib (Celebrex)  Clopidogrel (Plavix)  Diphenoxylate (Lomotil)  Donepezil (Aricept)  Estropipate (Ogen)  Fluticasone (Advair)  Mupirocin  Pantoprazole (Protonix)  Pregabalin (Lyrica)  Salmeterol (Advair)  Simvastatin (Zocor)  Sumatriptan (Imitrex)  Tiotropium (Spiriva)  Triamcinolone (Kenalog) ==================================================================== For clinical consultation, please call [phone]. ====================================================================    UDS interpretation: Compliant Patient reminded of the CDC guidelines recommending to stay away from the sedatives & benzodiazepines due to the risk of respiratory depression and death. Medication Assessment Form: Reviewed. Patient indicates being compliant with therapy Treatment compliance: Compliant Risk Assessment  Profile: Aberrant behavior: See prior evaluations. None observed or detected today Comorbid factors increasing risk of overdose: See prior notes. No additional risks detected today Risk of substance use disorder (SUD): Low Opioid Risk Tool - 11/11/17 1151      Family History of Substance Abuse   Alcohol  Positive Female    Illegal Drugs  Positive Female    Rx Drugs  Positive Female or Female      Personal History of Substance Abuse   Alcohol  Negative    Illegal Drugs  Negative    Rx Drugs  Negative      Age   Age between 74-45 years   No      History of Preadolescent Sexual Abuse   History of Preadolescent Sexual Abuse  Negative or Female      Psychological Disease   Psychological Disease  Negative    Depression  Negative      Total Score   Opioid Risk Tool Scoring  7    Opioid Risk Interpretation  Moderate Risk      ORT Scoring interpretation table:  Score <3 = Low Risk for SUD  Score between 4-7 = Moderate Risk for SUD  Score >8 = High Risk for Opioid Abuse   Risk Mitigation Strategies:  Patient Counseling: Covered Patient-Prescriber Agreement (PPA): Present and active  Notification to other healthcare providers: Done  Pharmacologic Plan: No change in therapy, at this time  Laboratory Chemistry  Inflammation Markers (CRP: Acute Phase) (ESR: Chronic Phase) Lab Results  Component Value Date   CRP 0.6 03/01/2016   ESRSEDRATE 52 (H) 03/01/2016   LATICACIDVEN 1.6 09/12/2017                 Rheumatology Markers Lab Results  Component Value Date   LABURIC 5.9 04/12/2016                Renal Function Markers Lab Results  Component Value Date   BUN 15 09/12/2017   CREATININE 1.16 (H) 09/12/2017   GFRAA 55 (L) 09/12/2017   GFRNONAA 47 (L) 09/12/2017                 Hepatic Function Markers Lab Results  Component Value Date   AST 17 09/12/2017   ALT 10 (L) 09/12/2017   ALBUMIN 3.3 (L) 09/12/2017   ALKPHOS 83 09/12/2017   AMYLASE 30 10/17/2015   LIPASE  16 09/12/2017   AMMONIA <9 (L) 09/12/2017                 Electrolytes Lab Results  Component Value Date   NA 137 09/12/2017   K 4.0 09/12/2017   CL 100 (L) 09/12/2017   CALCIUM 8.2 (L) 09/12/2017   MG 1.9 04/29/2017   PHOS 2.5 06/04/2016  Neuropathy Markers Lab Results  Component Value Date   VITAMINB12 242 03/01/2016   HGBA1C 5.5 04/30/2017                 Bone Pathology Markers Lab Results  Component Value Date   VD25OH 25.6 (L) 01/17/2016   VD125OH2TOT 27 07/04/2017   [id number] 24 07/04/2017   [id number] <10 07/04/2017                 Coagulation Parameters Lab Results  Component Value Date   INR 0.96 09/12/2017   LABPROT 12.7 09/12/2017   APTT 38 (H) 10/17/2015   PLT 221 09/12/2017   DDIMER  10/30/2009    0.32        AT THE INHOUSE ESTABLISHED CUTOFF VALUE OF 0.48 ug/mL FEU, THIS ASSAY HAS BEEN DOCUMENTED IN THE LITERATURE TO HAVE A SENSITIVITY AND NEGATIVE PREDICTIVE VALUE OF AT LEAST 98 TO 99%.  THE TEST RESULT SHOULD BE CORRELATED WITH AN ASSESSMENT OF THE CLINICAL PROBABILITY OF DVT / VTE.                 Cardiovascular Markers Lab Results  Component Value Date   BNP 137.0 (H) 05/14/2017   CKTOTAL 65 04/03/2013   CKMB 1.4 04/03/2013   TROPONINI <0.03 09/12/2017   HGB 9.1 (L) 09/12/2017   HCT 27.6 (L) 09/12/2017                 CA Markers No results found for: CEA, CA125, LABCA2               Note: Lab results reviewed.  Recent Diagnostic Imaging Results  DG Chest 2 View CLINICAL DATA:  Acute right chest pain following fall last week.  EXAM: CHEST  2 VIEW  COMPARISON:  09/19/2017 and prior radiographs  FINDINGS: Acute fracture of the anterior right ninth rib is again noted.  Remote fractures of the right eighth through tenth ribs again identified.  The cardiomediastinal silhouette is unchanged.  No airspace disease, pleural effusion or pneumothorax.  IMPRESSION: Unchanged appearance of the chest with  anterior right ninth rib fracture again noted.  Electronically Signed   By: Margarette Canada M.D.   On: 09/25/2017 13:47  Complexity Note: Imaging results reviewed. Results shared with Ms. Pott, using Layman's terms.                         Meds   Current Outpatient Medications:  .  acetaminophen (TYLENOL) 325 MG tablet, Take 2 tablets (650 mg total) by mouth every 6 (six) hours as needed for mild pain (or Fever >/= 101)., Disp: 30 tablet, Rfl: 0 .  ADVAIR DISKUS 250-50 MCG/DOSE AEPB, inhale 1 dose by mouth twice a day, Disp: 60 each, Rfl: 5 .  albuterol (PROVENTIL HFA;VENTOLIN HFA) 108 (90 BASE) MCG/ACT inhaler, Inhale 2 puffs into the lungs every 4 (four) hours as needed for wheezing or shortness of breath., Disp: 18 g, Rfl: 3 .  albuterol (PROVENTIL) (2.5 MG/3ML) 0.083% nebulizer solution, Take 3 mLs (2.5 mg total) by nebulization every 4 (four) hours as needed for wheezing., Disp: 75 mL, Rfl: 12 .  alendronate (FOSAMAX) 70 MG tablet, Take 1 tablet (70 mg total) by mouth every 7 (seven) days. Take with a full glass of water on an empty stomach., Disp: 4 tablet, Rfl: 11 .  ALPRAZolam (XANAX) 0.5 MG tablet, Take 1 tablet (0.5 mg total) by mouth 3 (three) times daily as needed for anxiety., Disp:  60 tablet, Rfl: 3 .  celecoxib (CELEBREX) 100 MG capsule, Take 1 capsule (100 mg total) by mouth 2 (two) times daily as needed., Disp: 60 capsule, Rfl: 1 .  clopidogrel (PLAVIX) 75 MG tablet, Take 75 mg by mouth daily., Disp: , Rfl:  .  diphenoxylate-atropine (LOMOTIL) 2.5-0.025 MG per tablet, Take 2 tablets by mouth 4 (four) times daily as needed for diarrhea or loose stools. Reported on 03/11/2016, Disp: , Rfl:  .  donepezil (ARICEPT) 10 MG tablet, Take 10 mg by mouth at bedtime., Disp: , Rfl:  .  estropipate (OGEN) 0.75 MG tablet, take 1 tablet by mouth every other day, Disp: 30 tablet, Rfl: 3 .  furosemide (LASIX) 40 MG tablet, Take 40 mg by mouth every other day. , Disp: , Rfl:  .  LYRICA 300 MG  capsule, Take 300 mg by mouth 2 (two) times daily., Disp: , Rfl: 0 .  mupirocin ointment (BACTROBAN) 2 %, Place 1 application into the nose 2 (two) times daily., Disp: 30 g, Rfl: 2 .  NARCAN 4 MG/0.1ML LIQD nasal spray kit, Place 0.4 mg into the nose once. , Disp: , Rfl:  .  pantoprazole (PROTONIX) 40 MG tablet, take 1 tablet by mouth twice a day, Disp: 60 tablet, Rfl: 12 .  simvastatin (ZOCOR) 10 MG tablet, Take 10 mg by mouth at bedtime. Reported on 12/04/2015, Disp: , Rfl:  .  SPIRIVA HANDIHALER 18 MCG inhalation capsule, inhale the contents of one capsule in the handihaler once daily, Disp: 30 capsule, Rfl: 12 .  SUMAtriptan (IMITREX) 25 MG tablet, Take 25 mg by mouth as needed for migraine. May repeat in 2 hours if headache persists or recurs., Disp: , Rfl:  .  [START ON 11/19/2017] traMADol (ULTRAM) 50 MG tablet, Take 2 tablets (100 mg total) by mouth every 6 (six) hours as needed for moderate pain or severe pain., Disp: 240 tablet, Rfl: 2 .  triamcinolone ointment (KENALOG) 0.5 %, Apply to lesions on hands twice a day as needed, Disp: 30 g, Rfl: 1 .  [START ON 01/22/2018] fentaNYL (DURAGESIC - DOSED MCG/HR) 50 MCG/HR, Place 1 patch (50 mcg total) onto the skin every 3 (three) days., Disp: 10 patch, Rfl: 0 .  [START ON 12/23/2017] fentaNYL (DURAGESIC - DOSED MCG/HR) 50 MCG/HR, Place 1 patch (50 mcg total) onto the skin every 3 (three) days., Disp: 10 patch, Rfl: 0 .  [START ON 11/23/2017] fentaNYL (DURAGESIC - DOSED MCG/HR) 50 MCG/HR, Place 1 patch (50 mcg total) onto the skin every 3 (three) days., Disp: 10 patch, Rfl: 0  ROS  Constitutional: Denies any fever or chills Gastrointestinal: No reported hemesis, hematochezia, vomiting, or acute GI distress Musculoskeletal: Denies any acute onset joint swelling, redness, loss of ROM, or weakness Neurological: No reported episodes of acute onset apraxia, aphasia, dysarthria, agnosia, amnesia, paralysis, loss of coordination, or loss of  consciousness  Allergies  Ms. Fischl is allergic to percocet [oxycodone-acetaminophen]; aspirin; codeine; propoxyphene; and sulfa antibiotics.  [place]  Drug: Ms. Pesci  reports that she does not use drugs. Alcohol:  reports that she does not drink alcohol. Tobacco:  reports that she has been smoking cigarettes.  She has a 25.00 pack-year smoking history. she has never used smokeless tobacco. Medical:  has a past medical history of Allergy, Anxiety, Arthritis, Aspiration pneumonitis ([place]) (11/24/2015), Asthma, Chronic pain, COPD (chronic obstructive pulmonary disease) ([place]), Coronary artery disease, DVT (deep venous thrombosis) ([place]), GERD (gastroesophageal reflux disease), Headache, Hyperlipidemia, Hypertension, Kyphoscoliosis deformity of spine, Migraines,  Neuropathy (2010), Osteoporosis, Oxygen deficiency, Peripheral vascular disease ([place]), Pneumonia, Pneumonia (11/19/2015), Pneumonia (10/2016), Pneumonia, and Vitamin D deficiency. Surgical: Ms. Manzella  has a past surgical history that includes Appendectomy; Spine surgery; Foot surgery (Bilateral); cardiac catherization (10/31/2009); abdomnal aortic stent (05/30/2008); Abdominal hysterectomy (1975); Cholecystectomy (1972); Cervical fusion (C5 - 6/C6-7); Appendectomy; Colonoscopy with propofol (N/A, 07/27/2015); and Esophagogastroduodenoscopy (egd) with propofol (N/A, 07/27/2015). Family: family history includes Arthritis in her mother; Cancer in her brother, brother, and mother; Diabetes in her brother, mother, and sister; Heart disease in her father and mother.  Constitutional Exam  General appearance: Well nourished, well developed, and well hydrated. In no apparent acute distress Vitals:   11/11/17 1147  BP: (!) 150/64  Pulse: 84  Resp: 16  Temp: 97.8 F (36.6 C)  SpO2: 98%  Weight: 93 lb (42.2 kg)  Height: '4\' 10"'$  (1.473 m)   BMI Assessment: Estimated body mass index is 19.44 kg/m as calculated from the following:   Height as of this  encounter: '4\' 10"'$  (1.473 m).   Weight as of this encounter: 93 lb (42.2 kg). Psych/Mental status: Alert, oriented x 3 (person, place, & time)       Eyes: PERLA Respiratory: No evidence of acute respiratory distress  Cervical Spine Area Exam  Skin & Axial Inspection: No masses, redness, edema, swelling, or associated skin lesions Alignment: Symmetrical Functional ROM: Unrestricted ROM      Stability: No instability detected Muscle Tone/Strength: Functionally intact. No obvious neuro-muscular anomalies detected. Sensory (Neurological): Unimpaired Palpation: No palpable anomalies              Upper Extremity (UE) Exam    Side: Right upper extremity  Side: Left upper extremity  Skin & Extremity Inspection: Skin color, temperature, and hair growth are WNL. No peripheral edema or cyanosis. No masses, redness, swelling, asymmetry, or associated skin lesions. No contractures.  Skin & Extremity Inspection: Skin color, temperature, and hair growth are WNL. No peripheral edema or cyanosis. No masses, redness, swelling, asymmetry, or associated skin lesions. No contractures.  Functional ROM: Unrestricted ROM          Functional ROM: Unrestricted ROM          Muscle Tone/Strength: Functionally intact. No obvious neuro-muscular anomalies detected.  Muscle Tone/Strength: Functionally intact. No obvious neuro-muscular anomalies detected.  Sensory (Neurological): Unimpaired          Sensory (Neurological): Unimpaired          Palpation: No palpable anomalies              Palpation: No palpable anomalies              Specialized Test(s): Deferred         Specialized Test(s): Deferred          Thoracic Spine Area Exam  Skin & Axial Inspection: No masses, redness, or swelling Alignment: Symmetrical Functional ROM: Unrestricted ROM Stability: No instability detected Muscle Tone/Strength: Functionally intact. No obvious neuro-muscular anomalies detected. Sensory (Neurological): Unimpaired Muscle strength &  Tone: No palpable anomalies  Lumbar Spine Area Exam  Skin & Axial Inspection: No masses, redness, or swelling Alignment: Symmetrical Functional ROM: Unrestricted ROM      Stability: No instability detected Muscle Tone/Strength: Functionally intact. No obvious neuro-muscular anomalies detected. Sensory (Neurological): Unimpaired Palpation: Complains of area being tender to palpation       Provocative Tests: Lumbar Hyperextension and rotation test: evaluation deferred today       Lumbar Lateral bending test: evaluation deferred  today       Patrick's Maneuver: evaluation deferred today                    Gait & Posture Assessment  Ambulation: Patient ambulates using a walker Gait: Relatively normal for age and body habitus Posture: WNL   Lower Extremity Exam    Side: Right lower extremity  Side: Left lower extremity  Skin & Extremity Inspection: Skin color, temperature, and hair growth are WNL. No peripheral edema or cyanosis. No masses, redness, swelling, asymmetry, or associated skin lesions. No contractures.  Skin & Extremity Inspection: Skin color, temperature, and hair growth are WNL. No peripheral edema or cyanosis. No masses, redness, swelling, asymmetry, or associated skin lesions. No contractures.  Functional ROM: Unrestricted ROM          Functional ROM: Unrestricted ROM          Muscle Tone/Strength: Functionally intact. No obvious neuro-muscular anomalies detected.  Muscle Tone/Strength: Functionally intact. No obvious neuro-muscular anomalies detected.  Sensory (Neurological): Unimpaired  Sensory (Neurological): Unimpaired  Palpation: No palpable anomalies  Palpation: No palpable anomalies   Assessment  Primary Diagnosis & Pertinent Problem List: The primary encounter diagnosis was Chronic low back pain (Location of Primary Source of Pain) (Bilateral) (L>R). Diagnoses of Lumbar facet syndrome (Location of Primary Source of Pain) (Bilateral) (L>R), Lumbar spondylosis, and  Chronic pain syndrome were also pertinent to this visit.  Status Diagnosis  Controlled Controlled Controlled 1. Chronic low back pain (Location of Primary Source of Pain) (Bilateral) (L>R)   2. Lumbar facet syndrome (Location of Primary Source of Pain) (Bilateral) (L>R)   3. Lumbar spondylosis   4. Chronic pain syndrome     Problems updated and reviewed during this visit: No problems updated. Plan of Care  Pharmacotherapy (Medications Ordered): Meds ordered this encounter  Medications  . fentaNYL (DURAGESIC - DOSED MCG/HR) 50 MCG/HR    Sig: Place 1 patch (50 mcg total) onto the skin every 3 (three) days.    Dispense:  10 patch    Refill:  0    Patient may have prescription filled one day early if pharmacy is closed on scheduled refill date. Do not fill until: 01/22/2018 To last until: 02/21/2018    Order Specific Question:   Supervising Provider    Answer:   Milinda Pointer [phone]  . fentaNYL (DURAGESIC - DOSED MCG/HR) 50 MCG/HR    Sig: Place 1 patch (50 mcg total) onto the skin every 3 (three) days.    Dispense:  10 patch    Refill:  0    Patient may have prescription filled one day early if pharmacy is closed on scheduled refill date. Do not fill until: 12/23/2017 To last until: 01/22/2018    Order Specific Question:   Supervising Provider    Answer:   Milinda Pointer [phone]  . fentaNYL (DURAGESIC - DOSED MCG/HR) 50 MCG/HR    Sig: Place 1 patch (50 mcg total) onto the skin every 3 (three) days.    Dispense:  10 patch    Refill:  0    Patient may have prescription filled one day early if pharmacy is closed on scheduled refill date. Do not fill until: 11/23/2017 To last until: 12/23/2017    Order Specific Question:   Supervising Provider    Answer:   Milinda Pointer [phone]  . traMADol (ULTRAM) 50 MG tablet    Sig: Take 2 tablets (100 mg total) by mouth every 6 (six) hours as needed for  moderate pain or severe pain.    Dispense:  240 tablet    Refill:  2     Patient may have prescription filled one day early if pharmacy is closed on scheduled refill date. Do not fill until: 10/19/2017 To last until: 11/18/2017    Order Specific Question:   Supervising Provider    Answer:   Milinda Pointer [phone]  This SmartLink is deprecated. Use AVSMEDLIST instead to display the medication list for a patient. Medications administered today: Lesle Reek had no medications administered during this visit. Lab-work, procedure(s), and/or referral(s): No orders of the defined types were placed in this encounter.  Imaging and/or referral(s): None  Interventional therapies: Planned, scheduled, and/or pending:  Not at this time   Considering:  Bilateral lumbar facet radiofrequency ablation    Palliative PRN treatment(s):  Palliative bilateral lumbar facet block under fluoroscopic guidance and IV sedation      Provider-requested follow-up: Return in about 3 months (around 02/09/2018) for MedMgmt with Me Dionisio David).  Future Appointments  Date Time [facility]  02/05/2018 10:30 AM Vevelyn Francois, NP [facility] None   Primary Care Physician: Birdie Sons, MD Location: [facility] Note by: Vevelyn Francois NP Date: 11/11/2017; Time: 1:37 PM  Pain Score Disclaimer: We use the NRS-11 scale. This is a self-reported, subjective measurement of pain severity with only modest accuracy. It is used primarily to identify changes within a particular patient. It must be understood that outpatient pain scales are significantly less accurate that those used for research, where they can be applied under ideal controlled circumstances with minimal exposure to variables. In reality, the score is likely to be a combination of pain intensity and pain affect, where pain affect describes the degree of emotional arousal or changes in action readiness caused by the sensory experience of pain. Factors such as social and work  situation, setting, emotional state, anxiety levels, expectation, and prior pain experience may influence pain perception and show large inter-individual differences that may also be affected by time variables.  Patient instructions provided during this appointment: Patient Instructions   ____________________________________________________________________________________________  Medication Rules  Applies to: All patients receiving prescriptions (written or electronic).  Pharmacy of record: Pharmacy where electronic prescriptions will be sent. If written prescriptions are taken to a different pharmacy, please inform the nursing staff. The pharmacy listed in the electronic medical record should be the one where you would like electronic prescriptions to be sent.  Prescription refills: Only during scheduled appointments. Applies to both, written and electronic prescriptions.  NOTE: The following applies primarily to controlled substances (Opioid* Pain Medications).   Patient's responsibilities: 1. Pain Pills: Bring all pain pills to every appointment (except for procedure appointments). 2. Pill Bottles: Bring pills in original pharmacy bottle. Always bring newest bottle. Bring bottle, even if empty. 3. Medication refills: You are responsible for knowing and keeping track of what medications you need refilled. The day before your appointment, write a list of all prescriptions that need to be refilled. Bring that list to your appointment and give it to the admitting nurse. Prescriptions will be written only during appointments. If you forget a medication, it will not be "Called in", "Faxed", or "electronically sent". You will need to get another appointment to get these prescribed. 4. Prescription Accuracy: You are responsible for carefully inspecting your prescriptions before leaving our office. Have the discharge nurse carefully go over each prescription with you, before taking them home. Make  sure  that your name is accurately spelled, that your address is correct. Check the name and dose of your medication to make sure it is accurate. Check the number of pills, and the written instructions to make sure they are clear and accurate. Make sure that you are given enough medication to last until your next medication refill appointment. 5. Taking Medication: Take medication as prescribed. Never take more pills than instructed. Never take medication more frequently than prescribed. Taking less pills or less frequently is permitted and encouraged, when it comes to controlled substances (written prescriptions).  6. Inform other Doctors: Always inform, all of your healthcare providers, of all the medications you take. 7. Pain Medication from other Providers: You are not allowed to accept any additional pain medication from any other Doctor or Healthcare provider. There are two exceptions to this rule. (see below) In the event that you require additional pain medication, you are responsible for notifying us, as stated below. 8. Medication Agreement: You are responsible for carefully reading and following our Medication Agreement. This must be signed before receiving any prescriptions from our practice. Safely store a copy of your signed Agreement. Violations to the Agreement will result in no further prescriptions. (Additional copies of our Medication Agreement are available upon request.) 9. Laws, Rules, & Regulations: All patients are expected to follow all Federal and [employer], Rules, [employer]. Ignorance of the Laws does not constitute a valid excuse. The use of any illegal substances is prohibited. 10. Adopted CDC guidelines & recommendations: Target dosing levels will be at or below 60 MME/day. Use of benzodiazepines** is not recommended.  Exceptions: There are only two exceptions to the rule of not receiving pain medications from other Healthcare Providers. 1. Exception #1  (Emergencies): In the event of an emergency (i.e.: accident requiring emergency care), you are allowed to receive additional pain medication. However, you are responsible for: As soon as you are able, call our office [phone], at any time of the day or night, and leave a message stating your name, the date and nature of the emergency, and the name and dose of the medication prescribed. In the event that your call is answered by a member of our staff, make sure to document and save the date, time, and the name of the person that took your information.  2. Exception #2 (Planned Surgery): In the event that you are scheduled by another doctor or dentist to have any type of surgery or procedure, you are allowed (for a period no longer than 30 days), to receive additional pain medication, for the acute post-op pain. However, in this case, you are responsible for picking up a copy of our "Post-op Pain Management for Surgeons" handout, and giving it to your surgeon or dentist. This document is available at our office, and does not require an appointment to obtain it. Simply go to our office during business hours (Monday-Thursday from 8:00 AM to 4:00 PM) (Friday 8:00 AM to 12:00 Noon) or if you have a scheduled appointment with [place], prior to your surgery, and ask for it by name. In addition, you will need to provide [place] with your name, name of your surgeon, type of surgery, and date of procedure or surgery.  *Opioid medications include: morphine, codeine, oxycodone, oxymorphone, hydrocodone, hydromorphone, meperidine, tramadol, tapentadol, buprenorphine, fentanyl, methadone. **Benzodiazepine medications include: diazepam (Valium), alprazolam (Xanax), clonazepam (Klonopine), lorazepam (Ativan), clorazepate (Tranxene), chlordiazepoxide (Librium), estazolam (Prosom), oxazepam (Serax), temazepam (Restoril), triazolam (Halcion)  You were given 3 prescriptions  for Fentanyl Patch and one prescription for  Tramadol.  ____________________________________________________________________________________________

## 2017-11-11 NOTE — Telephone Encounter (Signed)
Sherry with [facility] is requesting a verbal order to continues seeing pt for home health 1 time ever 2 weeks for 9 weeks to monitor patient.  CB#[phone]

## 2017-11-11 NOTE — Telephone Encounter (Signed)
Please review. Thanks!  

## 2017-11-11 NOTE — Telephone Encounter (Signed)
OK 

## 2017-11-11 NOTE — Patient Instructions (Addendum)
____________________________________________________________________________________________  Medication Rules  Applies to: All patients receiving prescriptions (written or electronic).  Pharmacy of record: Pharmacy where electronic prescriptions will be sent. If written prescriptions are taken to a different pharmacy, please inform the nursing staff. The pharmacy listed in the electronic medical record should be the one where you would like electronic prescriptions to be sent.  Prescription refills: Only during scheduled appointments. Applies to both, written and electronic prescriptions.  NOTE: The following applies primarily to controlled substances (Opioid* Pain Medications).   Patient's responsibilities: 1. Pain Pills: Bring all pain pills to every appointment (except for procedure appointments). 2. Pill Bottles: Bring pills in original pharmacy bottle. Always bring newest bottle. Bring bottle, even if empty. 3. Medication refills: You are responsible for knowing and keeping track of what medications you need refilled. The day before your appointment, write a list of all prescriptions that need to be refilled. Bring that list to your appointment and give it to the admitting nurse. Prescriptions will be written only during appointments. If you forget a medication, it will not be "Called in", "Faxed", or "electronically sent". You will need to get another appointment to get these prescribed. 4. Prescription Accuracy: You are responsible for carefully inspecting your prescriptions before leaving our office. Have the discharge nurse carefully go over each prescription with you, before taking them home. Make sure that your name is accurately spelled, that your address is correct. Check the name and dose of your medication to make sure it is accurate. Check the number of pills, and the written instructions to make sure they are clear and accurate. Make sure that you are given enough medication to  last until your next medication refill appointment. 5. Taking Medication: Take medication as prescribed. Never take more pills than instructed. Never take medication more frequently than prescribed. Taking less pills or less frequently is permitted and encouraged, when it comes to controlled substances (written prescriptions).  6. Inform other Doctors: Always inform, all of your healthcare providers, of all the medications you take. 7. Pain Medication from other Providers: You are not allowed to accept any additional pain medication from any other Doctor or Healthcare provider. There are two exceptions to this rule. (see below) In the event that you require additional pain medication, you are responsible for notifying us, as stated below. 8. Medication Agreement: You are responsible for carefully reading and following our Medication Agreement. This must be signed before receiving any prescriptions from our practice. Safely store a copy of your signed Agreement. Violations to the Agreement will result in no further prescriptions. (Additional copies of our Medication Agreement are available upon request.) 9. Laws, Rules, & Regulations: All patients are expected to follow all Federal and State Laws, Statutes, Rules, & Regulations. Ignorance of the Laws does not constitute a valid excuse. The use of any illegal substances is prohibited. 10. Adopted CDC guidelines & recommendations: Target dosing levels will be at or below 60 MME/day. Use of benzodiazepines** is not recommended.  Exceptions: There are only two exceptions to the rule of not receiving pain medications from other Healthcare Providers. 1. Exception #1 (Emergencies): In the event of an emergency (i.e.: accident requiring emergency care), you are allowed to receive additional pain medication. However, you are responsible for: As soon as you are able, call our office [phone], at any time of the day or night, and leave a message stating your  name, the date and nature of the emergency, and the name and dose of the medication   prescribed. In the event that your call is answered by a member of our staff, make sure to document and save the date, time, and the name of the person that took your information.  2. Exception #2 (Planned Surgery): In the event that you are scheduled by another doctor or dentist to have any type of surgery or procedure, you are allowed (for a period no longer than 30 days), to receive additional pain medication, for the acute post-op pain. However, in this case, you are responsible for picking up a copy of our "Post-op Pain Management for Surgeons" handout, and giving it to your surgeon or dentist. This document is available at our office, and does not require an appointment to obtain it. Simply go to our office during business hours (Monday-Thursday from 8:00 AM to 4:00 PM) (Friday 8:00 AM to 12:00 Noon) or if you have a scheduled appointment with us, prior to your surgery, and ask for it by name. In addition, you will need to provide us with your name, name of your surgeon, type of surgery, and date of procedure or surgery.  *Opioid medications include: morphine, codeine, oxycodone, oxymorphone, hydrocodone, hydromorphone, meperidine, tramadol, tapentadol, buprenorphine, fentanyl, methadone. **Benzodiazepine medications include: diazepam (Valium), alprazolam (Xanax), clonazepam (Klonopine), lorazepam (Ativan), clorazepate (Tranxene), chlordiazepoxide (Librium), estazolam (Prosom), oxazepam (Serax), temazepam (Restoril), triazolam (Halcion)  You were given 3 prescriptions for Fentanyl Patch and one prescription for Tramadol.  ____________________________________________________________________________________________

## 2017-11-11 NOTE — Telephone Encounter (Signed)
Advised patient as below.  

## 2017-11-13 DIAGNOSIS — J449 Chronic obstructive pulmonary disease, unspecified: Secondary | ICD-10-CM | POA: Diagnosis not present

## 2017-11-14 DIAGNOSIS — I2510 Atherosclerotic heart disease of native coronary artery without angina pectoris: Secondary | ICD-10-CM | POA: Diagnosis not present

## 2017-11-14 DIAGNOSIS — K219 Gastro-esophageal reflux disease without esophagitis: Secondary | ICD-10-CM | POA: Diagnosis not present

## 2017-11-14 DIAGNOSIS — J441 Chronic obstructive pulmonary disease with (acute) exacerbation: Secondary | ICD-10-CM | POA: Diagnosis not present

## 2017-11-14 DIAGNOSIS — M810 Age-related osteoporosis without current pathological fracture: Secondary | ICD-10-CM | POA: Diagnosis not present

## 2017-11-14 DIAGNOSIS — G8929 Other chronic pain: Secondary | ICD-10-CM | POA: Diagnosis not present

## 2017-11-14 DIAGNOSIS — I10 Essential (primary) hypertension: Secondary | ICD-10-CM | POA: Diagnosis not present

## 2017-11-14 DIAGNOSIS — J9611 Chronic respiratory failure with hypoxia: Secondary | ICD-10-CM | POA: Diagnosis not present

## 2017-11-14 DIAGNOSIS — G629 Polyneuropathy, unspecified: Secondary | ICD-10-CM | POA: Diagnosis not present

## 2017-11-14 DIAGNOSIS — I739 Peripheral vascular disease, unspecified: Secondary | ICD-10-CM | POA: Diagnosis not present

## 2017-11-15 DIAGNOSIS — J449 Chronic obstructive pulmonary disease, unspecified: Secondary | ICD-10-CM | POA: Diagnosis not present

## 2017-11-21 ENCOUNTER — Other Ambulatory Visit: Payer: Self-pay | Admitting: Family Medicine

## 2017-11-21 DIAGNOSIS — I10 Essential (primary) hypertension: Secondary | ICD-10-CM | POA: Diagnosis not present

## 2017-11-21 DIAGNOSIS — K210 Gastro-esophageal reflux disease with esophagitis: Secondary | ICD-10-CM | POA: Diagnosis not present

## 2017-11-21 DIAGNOSIS — R0602 Shortness of breath: Secondary | ICD-10-CM | POA: Diagnosis not present

## 2017-11-21 DIAGNOSIS — I340 Nonrheumatic mitral (valve) insufficiency: Secondary | ICD-10-CM | POA: Diagnosis not present

## 2017-11-21 DIAGNOSIS — I739 Peripheral vascular disease, unspecified: Secondary | ICD-10-CM | POA: Diagnosis not present

## 2017-11-21 DIAGNOSIS — I2510 Atherosclerotic heart disease of native coronary artery without angina pectoris: Secondary | ICD-10-CM | POA: Diagnosis not present

## 2017-11-21 DIAGNOSIS — F17210 Nicotine dependence, cigarettes, uncomplicated: Secondary | ICD-10-CM | POA: Diagnosis not present

## 2017-11-21 NOTE — Telephone Encounter (Signed)
[facility] pharmacy faxed a refill request for the following medication.  Thanks CC  SPIRIVA HANDIHALER 18 MCG inhalation capsule

## 2017-11-22 MED ORDER — TIOTROPIUM BROMIDE MONOHYDRATE 18 MCG IN CAPS: ORAL_CAPSULE | RESPIRATORY_TRACT | 12 refills | Status: DC

## 2017-11-24 ENCOUNTER — Ambulatory Visit (INDEPENDENT_AMBULATORY_CARE_PROVIDER_SITE_OTHER): Payer: Medicare HMO | Admitting: Family Medicine

## 2017-11-24 ENCOUNTER — Encounter: Payer: Self-pay | Admitting: Family Medicine

## 2017-11-24 ENCOUNTER — Telehealth: Payer: Self-pay | Admitting: Family Medicine

## 2017-11-24 VITALS — BP 130/70 | HR 72 | Temp 98.2°F | Resp 16 | Wt 97.0 lb

## 2017-11-24 DIAGNOSIS — R059 Cough, unspecified: Secondary | ICD-10-CM

## 2017-11-24 DIAGNOSIS — R05 Cough: Secondary | ICD-10-CM | POA: Diagnosis not present

## 2017-11-24 DIAGNOSIS — L03116 Cellulitis of left lower limb: Secondary | ICD-10-CM | POA: Diagnosis not present

## 2017-11-24 MED ORDER — DOXYCYCLINE HYCLATE 100 MG PO TABS: 100.0000 mg | ORAL_TABLET | Freq: Two times a day (BID) | ORAL | 0 refills | Status: DC

## 2017-11-24 MED ORDER — BENZONATATE 100 MG PO CAPS: 100.0000 mg | ORAL_CAPSULE | Freq: Two times a day (BID) | ORAL | 1 refills | Status: DC | PRN

## 2017-11-24 NOTE — Progress Notes (Signed)
Patient: Cheryl [name] Female    DOB: 1951-08-01   66 y.o.   MRN: [id number] Visit Date: 11/24/2017  Today's Provider: Lelon Huh, MD   Chief Complaint  Patient presents with  . Open Wound   Subjective:    Patient has a sore on left lower leg, side of calf. Patient states sore has been there off and on for a while and came back last week. Patient has redness around sore. Also the toes on her left foot turned a purple color 2 days ago. Patient states that she does have some numbness in the left foot and started at the same time toes changed colors.   Has been applying mupirocin cream the last two days.   Also complains of occasional cough that responds well to tessalon. No fevers, chills, sweats. Cough is non-productive. No sinus congestion or drainage.      Allergies  Allergen Reactions  . Percocet [Oxycodone-Acetaminophen] Hives and Rash  . Aspirin Nausea And Vomiting and Other (See Comments)    Reaction:  GI upset   . Codeine Nausea And Vomiting, Other (See Comments) and Nausea Only    Reaction:  GI upset   . Propoxyphene Other (See Comments) and Nausea Only    GI upset Reaction:  GI upset   . Sulfa Antibiotics Rash and Other (See Comments)    Reaction:  GI upset      Current Outpatient Medications:  .  acetaminophen (TYLENOL) 325 MG tablet, Take 2 tablets (650 mg total) by mouth every 6 (six) hours as needed for mild pain (or Fever >/= 101)., Disp: 30 tablet, Rfl: 0 .  ADVAIR DISKUS 250-50 MCG/DOSE AEPB, inhale 1 dose by mouth twice a day, Disp: 60 each, Rfl: 5 .  albuterol (PROVENTIL HFA;VENTOLIN HFA) 108 (90 BASE) MCG/ACT inhaler, Inhale 2 puffs into the lungs every 4 (four) hours as needed for wheezing or shortness of breath., Disp: 18 g, Rfl: 3 .  albuterol (PROVENTIL) (2.5 MG/3ML) 0.083% nebulizer solution, Take 3 mLs (2.5 mg total) by nebulization every 4 (four) hours as needed for wheezing., Disp: 75 mL, Rfl: 12 .  alendronate (FOSAMAX) 70 MG tablet,  Take 1 tablet (70 mg total) by mouth every 7 (seven) days. Take with a full glass of water on an empty stomach., Disp: 4 tablet, Rfl: 11 .  ALPRAZolam (XANAX) 0.5 MG tablet, Take 1 tablet (0.5 mg total) by mouth 3 (three) times daily as needed for anxiety., Disp: 60 tablet, Rfl: 3 .  celecoxib (CELEBREX) 100 MG capsule, Take 1 capsule (100 mg total) by mouth 2 (two) times daily as needed., Disp: 60 capsule, Rfl: 1 .  clopidogrel (PLAVIX) 75 MG tablet, Take 75 mg by mouth daily., Disp: , Rfl:  .  diphenoxylate-atropine (LOMOTIL) 2.5-0.025 MG per tablet, Take 2 tablets by mouth 4 (four) times daily as needed for diarrhea or loose stools. Reported on 03/11/2016, Disp: , Rfl:  .  donepezil (ARICEPT) 10 MG tablet, Take 10 mg by mouth at bedtime., Disp: , Rfl:  .  estropipate (OGEN) 0.75 MG tablet, take 1 tablet by mouth every other day, Disp: 30 tablet, Rfl: 3 .  [START ON 01/22/2018] fentaNYL (DURAGESIC - DOSED MCG/HR) 50 MCG/HR, Place 1 patch (50 mcg total) onto the skin every 3 (three) days., Disp: 10 patch, Rfl: 0 .  [START ON 12/23/2017] fentaNYL (DURAGESIC - DOSED MCG/HR) 50 MCG/HR, Place 1 patch (50 mcg total) onto the skin every 3 (three) days., Disp:  10 patch, Rfl: 0 .  fentaNYL (DURAGESIC - DOSED MCG/HR) 50 MCG/HR, Place 1 patch (50 mcg total) onto the skin every 3 (three) days., Disp: 10 patch, Rfl: 0 .  furosemide (LASIX) 40 MG tablet, Take 40 mg by mouth every other day. , Disp: , Rfl:  .  LYRICA 300 MG capsule, Take 300 mg by mouth 2 (two) times daily., Disp: , Rfl: 0 .  mupirocin ointment (BACTROBAN) 2 %, Place 1 application into the nose 2 (two) times daily., Disp: 30 g, Rfl: 2 .  NARCAN 4 MG/0.1ML LIQD nasal spray kit, Place 0.4 mg into the nose once. , Disp: , Rfl:  .  pantoprazole (PROTONIX) 40 MG tablet, take 1 tablet by mouth twice a day, Disp: 60 tablet, Rfl: 12 .  simvastatin (ZOCOR) 10 MG tablet, Take 10 mg by mouth at bedtime. Reported on 12/04/2015, Disp: , Rfl:  .  SUMAtriptan  (IMITREX) 25 MG tablet, Take 25 mg by mouth as needed for migraine. May repeat in 2 hours if headache persists or recurs., Disp: , Rfl:  .  tiotropium (SPIRIVA HANDIHALER) 18 MCG inhalation capsule, inhale the contents of one capsule in the handihaler once daily, Disp: 30 capsule, Rfl: 12 .  traMADol (ULTRAM) 50 MG tablet, Take 2 tablets (100 mg total) by mouth every 6 (six) hours as needed for moderate pain or severe pain., Disp: 240 tablet, Rfl: 2 .  triamcinolone ointment (KENALOG) 0.5 %, Apply to lesions on hands twice a day as needed, Disp: 30 g, Rfl: 1  Review of Systems  Constitutional: Negative for appetite change, chills, fatigue and fever.  Respiratory: Positive for cough. Negative for chest tightness and shortness of breath.   Cardiovascular: Negative for chest pain and palpitations.  Gastrointestinal: Negative for abdominal pain, nausea and vomiting.  Skin: Positive for wound.  Neurological: Positive for numbness. Negative for dizziness and weakness.    Social History   Tobacco Use  . Smoking status: Current Every Day Smoker    Packs/day: 0.50    Years: 50.00    Pack years: 25.00    Types: Cigarettes  . Smokeless tobacco: Never Used  . Tobacco comment: Previously smoked 2 ppd  Substance Use Topics  . Alcohol use: No    Alcohol/week: 0.0 oz   Objective:   BP 130/70 (BP Location: Left Arm, Patient Position: Sitting, Cuff Size: Normal)   Pulse 72   Temp 98.2 F (36.8 C) (Oral)   Resp 16   Wt 97 lb (44 kg)   SpO2 98%   BMI 20.27 kg/m  Vitals:   11/24/17 1143  BP: 130/70  Pulse: 72  Resp: 16  Temp: 98.2 F (36.8 C)  TempSrc: Oral  SpO2: 98%  Weight: 97 lb (44 kg)     Physical Exam  General appearance: alert, well developed, well nourished, cooperative and in no distress Head: Normocephalic, without obvious abnormality, atraumatic Respiratory: Respirations even and unlabored, normal respiratory rate. No wheezes rales, or rhonchi.  Extremities: No gross  deformities Skin: 1cm dried scab posterior left leg with about 1 1/2 cm surrounding erythema.      Assessment & Plan:     1. Cellulitis of left lower leg  - doxycycline (VIBRA-TABS) 100 MG tablet; Take 1 tablet (100 mg total) by mouth 2 (two) times daily.  Dispense: 20 tablet; Refill: 0  2. Cough She also requests refill for  benzonatate (TESSALON) 100 MG capsule; Take 1 capsule (100 mg total) by mouth 2 (two) times daily  as needed for cough.  Dispense: 20 capsule; Refill: 1  She has occasional cough for which she finds this to be effective. Has no dyspnea or unusual cough at this time.        Lelon Huh, MD  [facility]

## 2017-11-24 NOTE — Telephone Encounter (Signed)
PAtient husb called stating patient has a broken skin area on back of left leg and now her toes are discolored and ankles swollen

## 2017-11-27 DIAGNOSIS — G8929 Other chronic pain: Secondary | ICD-10-CM | POA: Diagnosis not present

## 2017-11-27 DIAGNOSIS — I2510 Atherosclerotic heart disease of native coronary artery without angina pectoris: Secondary | ICD-10-CM | POA: Diagnosis not present

## 2017-11-27 DIAGNOSIS — J9611 Chronic respiratory failure with hypoxia: Secondary | ICD-10-CM | POA: Diagnosis not present

## 2017-11-27 DIAGNOSIS — I10 Essential (primary) hypertension: Secondary | ICD-10-CM | POA: Diagnosis not present

## 2017-11-27 DIAGNOSIS — J441 Chronic obstructive pulmonary disease with (acute) exacerbation: Secondary | ICD-10-CM | POA: Diagnosis not present

## 2017-11-27 DIAGNOSIS — M810 Age-related osteoporosis without current pathological fracture: Secondary | ICD-10-CM | POA: Diagnosis not present

## 2017-11-27 DIAGNOSIS — K219 Gastro-esophageal reflux disease without esophagitis: Secondary | ICD-10-CM | POA: Diagnosis not present

## 2017-11-27 DIAGNOSIS — G629 Polyneuropathy, unspecified: Secondary | ICD-10-CM | POA: Diagnosis not present

## 2017-11-27 DIAGNOSIS — I739 Peripheral vascular disease, unspecified: Secondary | ICD-10-CM | POA: Diagnosis not present

## 2017-12-01 NOTE — Progress Notes (Signed)
[facility] Consultation      Assessment and Plan:  COPD group D.  -Severe emphysema/COPD, likely related to cigarette smoking. -Dyspnea on mild to moderate exertion, we discussed pulmonary rehabilitation, which she does not want to consider at this time. We discussed the importance of trying to increase her activity level, I recommended a regimen of 20 or 30 minutes of walking per day -Discussed lung cancer screening referral, given her advanced emphysema and possibility of lung collapse with any potential biopsies, we decided against this. -Continue Spiriva, reminded to use it every day, as this can decrease her risk of exacerbations. - Declines flu vaccine.  Restrictive lung disease.  -She has severe kyphosis, she is likely contributing to her dyspnea with reduced lung volumes.  Dyspnea. -Discussed the etiologies of her dyspnea, this is likely COPD, severe restriction from her spinal curvature, active smoking. Discussed that the only modifiable factor in this is her smoking at this time, thus quitting smoking as the best thing she can do for her respiratory health at this time.  Nicotine abuse.  -Currently smoking approximately one pack of cigarettes per day. -Spent greater than 3 minutes in smoking cessation discussion, she is not really considering quitting at this time.  Date: 12/01/2017  MRN# [id number] Cheryl MargaritaMary George [name] 1951/07/11    Cheryl [name] is a 67 y.o. old female seen in consultation for chief complaint of:    Chief Complaint  Patient presents with  . COPD    HPI:   The patient is a 67 year old female with a history of COPD, active nicotine abuse, oxygen dependence.  At last visit it was recommended that she quit smoking, continue Spiriva, and try to increase her activity level, but she had declined pulmonary rehab at that time. 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  She has continued to smoke and  is not considering quitting.  She is on spiriva, but using it "prn". She has a rescue inhaler which she has not had to use in the past few weeks.  She is on oxygen at 2L which she uses "prn".    Pt images personally reviewed; CXR 01/28/17; Severe hyperinflation consistent with emphysema; severe kyphosis.  Desat walk 03/13/17; beginning sat was 97% and HR 76 at rest on RA. After 150 feet her sat was 96% and HR 76. After 300 feet sat was 99% and HR 89. She had to stop due to dyspnea and leg fatigue, however she had walked with her walker at a fairly brisk pace.   **Prevnar 13 administered 03/13/17.  Social Hx:   Social History   Tobacco Use  . Smoking status: Current Every Day Smoker    Packs/day: 0.50    Years: 50.00    Pack years: 25.00    Types: Cigarettes  . Smokeless tobacco: Never Used  . Tobacco comment: Previously smoked 2 ppd  Substance Use Topics  . Alcohol use: No    Alcohol/week: 0.0 oz  . Drug use: No   Medication:   Reviewed   Allergies:  Percocet [oxycodone-acetaminophen]; Aspirin; Codeine; Propoxyphene; and Sulfa antibiotics  Review of Systems: Gen:  Denies  fever, sweats, chills HEENT: Denies blurred vision, double vision. bleeds, sore throat Cvc:  No dizziness, chest pain. Resp:   Denies cough or sputum production, shortness of breath Gi: Denies swallowing difficulty, stomach pain. Gu:  Denies bladder incontinence, burning urine Ext:   No Joint pain, stiffness. Skin: No skin rash,  hives  Endoc:  No polyuria, polydipsia. Psych: No depression, insomnia. Other:  All other systems were reviewed with the patient and were negative other that what is mentioned in the HPI.   Physical Examination:   VS: BP 118/60 (BP Location: Left Arm, Cuff Size: Normal)   Pulse 95   Resp 16   Ht 4\' 10"  (1.473 m)   Wt 98 lb (44.5 kg)   SpO2 94%   BMI 20.48 kg/m   General Appearance: No distress , severe spinal curvature/deformity. Neuro:without focal findings,  speech  normal,  HEENT: PERRLA, EOM intact.   Pulmonary: normal breath sounds, decreased air entry bilaterally. CardiovascularNormal S1,S2.  No m/r/g.   Abdomen: Benign, Soft, non-tender. Renal:  No costovertebral tenderness  GU:  No performed at this time. Endoc: No evident thyromegaly, no signs of acromegaly. Skin:   warm, no rashes, no ecchymosis  Extremities: normal, no cyanosis, clubbing.  Other findings:    LABORATORY PANEL:   CBC No results for input(s): WBC, HGB, HCT, PLT in the last 168 hours. ------------------------------------------------------------------------------------------------------------------  Chemistries  No results for input(s): NA, K, CL, CO2, GLUCOSE, BUN, CREATININE, CALCIUM, MG, AST, ALT, ALKPHOS, BILITOT in the last 168 hours.  Invalid input(s): GFRCGP ------------------------------------------------------------------------------------------------------------------  Cardiac Enzymes No results for input(s): TROPONINI in the last 168 hours. ------------------------------------------------------------  RADIOLOGY:  No results found.     Thank  you for the consultation and for allowing [facility] to assist in the care of your patient. Our recommendations are noted above.  Please contact us if we can be of further service.   Wells Guiles, MD.  Board Certified in Internal Medicine, Pulmonary Medicine, Critical Care Medicine, and Sleep Medicine.  [facility] Office Number: [phone]  Santiago Glad, M.D.  Billy Fischer, M.D  12/01/2017

## 2017-12-02 ENCOUNTER — Encounter: Payer: Self-pay | Admitting: Internal Medicine

## 2017-12-02 ENCOUNTER — Ambulatory Visit (INDEPENDENT_AMBULATORY_CARE_PROVIDER_SITE_OTHER): Payer: Medicare HMO | Admitting: Internal Medicine

## 2017-12-02 VITALS — BP 118/60 | HR 95 | Resp 16 | Ht <= 58 in | Wt 98.0 lb

## 2017-12-02 DIAGNOSIS — F17219 Nicotine dependence, cigarettes, with unspecified nicotine-induced disorders: Secondary | ICD-10-CM | POA: Diagnosis not present

## 2017-12-02 DIAGNOSIS — J438 Other emphysema: Secondary | ICD-10-CM

## 2017-12-02 NOTE — Patient Instructions (Signed)
  Use spiriva every day.   --Quitting smoking is the most important thing that you can do for your health.  --Quitting smoking will have greater affect on your health than any medicine that we can give you.

## 2017-12-04 ENCOUNTER — Encounter: Payer: Self-pay | Admitting: Family Medicine

## 2017-12-04 ENCOUNTER — Ambulatory Visit (INDEPENDENT_AMBULATORY_CARE_PROVIDER_SITE_OTHER): Payer: Medicare HMO | Admitting: Family Medicine

## 2017-12-04 VITALS — BP 138/60 | HR 81 | Temp 98.0°F | Resp 16

## 2017-12-04 DIAGNOSIS — Z9981 Dependence on supplemental oxygen: Secondary | ICD-10-CM

## 2017-12-04 DIAGNOSIS — J438 Other emphysema: Secondary | ICD-10-CM

## 2017-12-04 DIAGNOSIS — L03116 Cellulitis of left lower limb: Secondary | ICD-10-CM

## 2017-12-04 MED ORDER — DOXYCYCLINE HYCLATE 100 MG PO TABS: 100.0000 mg | ORAL_TABLET | Freq: Two times a day (BID) | ORAL | 0 refills | Status: AC

## 2017-12-04 NOTE — Progress Notes (Addendum)
Patient: Cheryl [name] Female    DOB: 1951/02/15   67 y.o.   MRN: [id number] Visit Date: 12/04/2017  Today's Provider: Lelon Huh, MD   Chief Complaint  Patient presents with  . Follow-up   Subjective:    HPI  Cellulitis of left lower leg From 11/24/2017-started on doxycycline (VIBRA-TABS) 100 MG tablet. Today patient reports good compliance with treatment and good tolerance. Patient reports that this problem has improved since the last visit. Patient has not had any drainage from her leg.'  Follow up COPD- She reports she continues to use Advair consistently use Advair every day and is working well. Usually requires albuterol inhaler a few times a day, but rarely uses nebulizer. Her oxygen order is expiring and she needs new order.   Allergies  Allergen Reactions  . Percocet [Oxycodone-Acetaminophen] Hives and Rash  . Aspirin Nausea And Vomiting and Other (See Comments)    Reaction:  GI upset   . Codeine Nausea And Vomiting, Other (See Comments) and Nausea Only    Reaction:  GI upset   . Propoxyphene Other (See Comments) and Nausea Only    GI upset Reaction:  GI upset   . Sulfa Antibiotics Rash and Other (See Comments)    Reaction:  GI upset      Current Outpatient Medications:  .  acetaminophen (TYLENOL) 325 MG tablet, Take 2 tablets (650 mg total) by mouth every 6 (six) hours as needed for mild pain (or Fever >/= 101)., Disp: 30 tablet, Rfl: 0 .  ADVAIR DISKUS 250-50 MCG/DOSE AEPB, inhale 1 dose by mouth twice a day, Disp: 60 each, Rfl: 5 .  albuterol (PROVENTIL HFA;VENTOLIN HFA) 108 (90 BASE) MCG/ACT inhaler, Inhale 2 puffs into the lungs every 4 (four) hours as needed for wheezing or shortness of breath., Disp: 18 g, Rfl: 3 .  albuterol (PROVENTIL) (2.5 MG/3ML) 0.083% nebulizer solution, Take 3 mLs (2.5 mg total) by nebulization every 4 (four) hours as needed for wheezing., Disp: 75 mL, Rfl: 12 .  alendronate (FOSAMAX) 70 MG tablet, Take 1 tablet (70 mg  total) by mouth every 7 (seven) days. Take with a full glass of water on an empty stomach., Disp: 4 tablet, Rfl: 11 .  ALPRAZolam (XANAX) 0.5 MG tablet, Take 1 tablet (0.5 mg total) by mouth 3 (three) times daily as needed for anxiety., Disp: 60 tablet, Rfl: 3 .  benzonatate (TESSALON) 100 MG capsule, Take 1 capsule (100 mg total) by mouth 2 (two) times daily as needed for cough., Disp: 20 capsule, Rfl: 1 .  celecoxib (CELEBREX) 100 MG capsule, Take 1 capsule (100 mg total) by mouth 2 (two) times daily as needed., Disp: 60 capsule, Rfl: 1 .  clopidogrel (PLAVIX) 75 MG tablet, Take 75 mg by mouth daily., Disp: , Rfl:  .  diphenoxylate-atropine (LOMOTIL) 2.5-0.025 MG per tablet, Take 2 tablets by mouth 4 (four) times daily as needed for diarrhea or loose stools. Reported on 03/11/2016, Disp: , Rfl:  .  donepezil (ARICEPT) 10 MG tablet, Take 10 mg by mouth at bedtime., Disp: , Rfl:  .  estropipate (OGEN) 0.75 MG tablet, take 1 tablet by mouth every other day, Disp: 30 tablet, Rfl: 3 .  [START ON 01/22/2018] fentaNYL (DURAGESIC - DOSED MCG/HR) 50 MCG/HR, Place 1 patch (50 mcg total) onto the skin every 3 (three) days., Disp: 10 patch, Rfl: 0 .  [START ON 12/23/2017] fentaNYL (DURAGESIC - DOSED MCG/HR) 50 MCG/HR, Place 1 patch (50 mcg  total) onto the skin every 3 (three) days., Disp: 10 patch, Rfl: 0 .  fentaNYL (DURAGESIC - DOSED MCG/HR) 50 MCG/HR, Place 1 patch (50 mcg total) onto the skin every 3 (three) days., Disp: 10 patch, Rfl: 0 .  furosemide (LASIX) 40 MG tablet, Take 40 mg by mouth every other day. , Disp: , Rfl:  .  LYRICA 300 MG capsule, Take 300 mg by mouth 2 (two) times daily., Disp: , Rfl: 0 .  mupirocin ointment (BACTROBAN) 2 %, Place 1 application into the nose 2 (two) times daily., Disp: 30 g, Rfl: 2 .  NARCAN 4 MG/0.1ML LIQD nasal spray kit, Place 0.4 mg into the nose once. , Disp: , Rfl:  .  pantoprazole (PROTONIX) 40 MG tablet, take 1 tablet by mouth twice a day, Disp: 60 tablet, Rfl:  12 .  simvastatin (ZOCOR) 10 MG tablet, Take 10 mg by mouth at bedtime. Reported on 12/04/2015, Disp: , Rfl:  .  SUMAtriptan (IMITREX) 25 MG tablet, Take 25 mg by mouth as needed for migraine. May repeat in 2 hours if headache persists or recurs., Disp: , Rfl:  .  tiotropium (SPIRIVA HANDIHALER) 18 MCG inhalation capsule, inhale the contents of one capsule in the handihaler once daily, Disp: 30 capsule, Rfl: 12 .  traMADol (ULTRAM) 50 MG tablet, Take 2 tablets (100 mg total) by mouth every 6 (six) hours as needed for moderate pain or severe pain., Disp: 240 tablet, Rfl: 2 .  triamcinolone ointment (KENALOG) 0.5 %, Apply to lesions on hands twice a day as needed, Disp: 30 g, Rfl: 1  Review of Systems  Constitutional: Negative for appetite change, chills, fatigue and fever.  Respiratory: Negative for chest tightness and shortness of breath.   Cardiovascular: Negative for chest pain and palpitations.  Gastrointestinal: Negative for abdominal pain, nausea and vomiting.  Skin: Positive for wound.  Neurological: Negative for dizziness and weakness.    Social History   Tobacco Use  . Smoking status: Current Every Day Smoker    Packs/day: 0.50    Years: 50.00    Pack years: 25.00    Types: Cigarettes  . Smokeless tobacco: Never Used  . Tobacco comment: Previously smoked 2 ppd  Substance Use Topics  . Alcohol use: No    Alcohol/week: 0.0 oz   Objective:   BP 138/60 (BP Location: Left Arm, Patient Position: Sitting, Cuff Size: Normal)   Pulse 81   Temp 98 F (36.7 C) (Oral)   Resp 16   SpO2 97% Comment: room air Vitals:   12/04/17 0820 12/04/17 0909 12/04/17 0911  BP: 138/60    Pulse: 81    Resp: 16    Temp: 98 F (36.7 C)    TempSrc: Oral    SpO2: 97% r/a at rest (!) 79% r/a with walking 100% 2lpm O2 walking     Physical Exam   General Appearance:    Alert, cooperative, no distress  Eyes:    PERRL, conjunctiva/corneas clear, EOM's intact       Lungs:     Clear to  auscultation bilaterally, respirations unlabored, diminished breath sounds.   Heart:    Regular rate and rhythm, distant heart sounds.  Neurologic:   Awake, alert, oriented x 3. No apparent focal neurological           defect.        About 3/4 dried scab with very faint surrounding erythema left anterior leg, significantly improved from previous vitis.     Assessment &  Plan:     1. Cellulitis of left lower leg Much improved, but not quite resolved. Complete an additional week of - doxycycline (VIBRA-TABS) 100 MG tablet; Take 1 tablet (100 mg total) by mouth 2 (two) times daily for 7 days.  Dispense: 14 tablet; Refill: 0  2. Other emphysema ([place]) Stable on current inhaler regiment. Continue current medications.    3. Requires supplemental oxygen Completed orders for supplemental oxygen when ambulating.        Lelon Huh, MD  [facility]

## 2017-12-08 DIAGNOSIS — I10 Essential (primary) hypertension: Secondary | ICD-10-CM | POA: Diagnosis not present

## 2017-12-08 DIAGNOSIS — J9611 Chronic respiratory failure with hypoxia: Secondary | ICD-10-CM | POA: Diagnosis not present

## 2017-12-08 DIAGNOSIS — I2510 Atherosclerotic heart disease of native coronary artery without angina pectoris: Secondary | ICD-10-CM | POA: Diagnosis not present

## 2017-12-08 DIAGNOSIS — J441 Chronic obstructive pulmonary disease with (acute) exacerbation: Secondary | ICD-10-CM | POA: Diagnosis not present

## 2017-12-09 ENCOUNTER — Other Ambulatory Visit: Payer: Self-pay | Admitting: Family Medicine

## 2017-12-09 DIAGNOSIS — M6289 Other specified disorders of muscle: Secondary | ICD-10-CM | POA: Diagnosis not present

## 2017-12-09 DIAGNOSIS — K219 Gastro-esophageal reflux disease without esophagitis: Secondary | ICD-10-CM | POA: Diagnosis not present

## 2017-12-09 DIAGNOSIS — R159 Full incontinence of feces: Secondary | ICD-10-CM | POA: Diagnosis not present

## 2017-12-11 DIAGNOSIS — I2510 Atherosclerotic heart disease of native coronary artery without angina pectoris: Secondary | ICD-10-CM | POA: Diagnosis not present

## 2017-12-11 DIAGNOSIS — K219 Gastro-esophageal reflux disease without esophagitis: Secondary | ICD-10-CM | POA: Diagnosis not present

## 2017-12-11 DIAGNOSIS — I739 Peripheral vascular disease, unspecified: Secondary | ICD-10-CM | POA: Diagnosis not present

## 2017-12-11 DIAGNOSIS — J9611 Chronic respiratory failure with hypoxia: Secondary | ICD-10-CM | POA: Diagnosis not present

## 2017-12-11 DIAGNOSIS — J441 Chronic obstructive pulmonary disease with (acute) exacerbation: Secondary | ICD-10-CM | POA: Diagnosis not present

## 2017-12-11 DIAGNOSIS — I10 Essential (primary) hypertension: Secondary | ICD-10-CM | POA: Diagnosis not present

## 2017-12-11 DIAGNOSIS — M810 Age-related osteoporosis without current pathological fracture: Secondary | ICD-10-CM | POA: Diagnosis not present

## 2017-12-11 DIAGNOSIS — G8929 Other chronic pain: Secondary | ICD-10-CM | POA: Diagnosis not present

## 2017-12-11 DIAGNOSIS — G629 Polyneuropathy, unspecified: Secondary | ICD-10-CM | POA: Diagnosis not present

## 2017-12-14 DIAGNOSIS — J449 Chronic obstructive pulmonary disease, unspecified: Secondary | ICD-10-CM | POA: Diagnosis not present

## 2017-12-16 DIAGNOSIS — J449 Chronic obstructive pulmonary disease, unspecified: Secondary | ICD-10-CM | POA: Diagnosis not present

## 2017-12-22 ENCOUNTER — Other Ambulatory Visit: Payer: Self-pay

## 2017-12-22 NOTE — Telephone Encounter (Signed)
Patient is requesting a refill on SUMAtriptan (IMITREX) 25 MG tablet be sent to [facility].

## 2017-12-22 NOTE — Addendum Note (Signed)
Addended by: Malva LimesFISHER, Amandeep Hogston E on: 12/22/2017 08:08 AM   Modules accepted: Level of Service

## 2017-12-23 MED ORDER — SUMATRIPTAN SUCCINATE 25 MG PO TABS: 25.0000 mg | ORAL_TABLET | ORAL | 5 refills | Status: DC | PRN

## 2017-12-23 NOTE — Telephone Encounter (Signed)
Please review. Thanks!  

## 2017-12-24 ENCOUNTER — Other Ambulatory Visit: Payer: Self-pay | Admitting: Family Medicine

## 2017-12-24 DIAGNOSIS — J441 Chronic obstructive pulmonary disease with (acute) exacerbation: Secondary | ICD-10-CM | POA: Diagnosis not present

## 2017-12-24 DIAGNOSIS — G8929 Other chronic pain: Secondary | ICD-10-CM | POA: Diagnosis not present

## 2017-12-24 DIAGNOSIS — I739 Peripheral vascular disease, unspecified: Secondary | ICD-10-CM | POA: Diagnosis not present

## 2017-12-24 DIAGNOSIS — G629 Polyneuropathy, unspecified: Secondary | ICD-10-CM | POA: Diagnosis not present

## 2017-12-24 DIAGNOSIS — M810 Age-related osteoporosis without current pathological fracture: Secondary | ICD-10-CM | POA: Diagnosis not present

## 2017-12-24 DIAGNOSIS — I10 Essential (primary) hypertension: Secondary | ICD-10-CM | POA: Diagnosis not present

## 2017-12-24 DIAGNOSIS — K219 Gastro-esophageal reflux disease without esophagitis: Secondary | ICD-10-CM | POA: Diagnosis not present

## 2017-12-24 DIAGNOSIS — I2510 Atherosclerotic heart disease of native coronary artery without angina pectoris: Secondary | ICD-10-CM | POA: Diagnosis not present

## 2017-12-24 DIAGNOSIS — J9611 Chronic respiratory failure with hypoxia: Secondary | ICD-10-CM | POA: Diagnosis not present

## 2017-12-24 MED ORDER — ALBUTEROL SULFATE (2.5 MG/3ML) 0.083% IN NEBU: 2.5000 mg | INHALATION_SOLUTION | RESPIRATORY_TRACT | 12 refills | Status: DC | PRN

## 2017-12-24 NOTE — Telephone Encounter (Signed)
Patient is requesting a refill on the following medication  albuterol (PROVENTIL) (2.5 MG/3ML) 0.083% nebulizer solution  She states that she had been given this in the past at the emergency room.  She states that she has the nebulizer machine already.  She states that the nurse that comes out to see her suggested that she start using this medication again for her cough.  [facility] [place].

## 2018-01-07 DIAGNOSIS — G8929 Other chronic pain: Secondary | ICD-10-CM | POA: Diagnosis not present

## 2018-01-07 DIAGNOSIS — I10 Essential (primary) hypertension: Secondary | ICD-10-CM | POA: Diagnosis not present

## 2018-01-07 DIAGNOSIS — J441 Chronic obstructive pulmonary disease with (acute) exacerbation: Secondary | ICD-10-CM | POA: Diagnosis not present

## 2018-01-07 DIAGNOSIS — I739 Peripheral vascular disease, unspecified: Secondary | ICD-10-CM | POA: Diagnosis not present

## 2018-01-07 DIAGNOSIS — K219 Gastro-esophageal reflux disease without esophagitis: Secondary | ICD-10-CM | POA: Diagnosis not present

## 2018-01-07 DIAGNOSIS — M6281 Muscle weakness (generalized): Secondary | ICD-10-CM | POA: Diagnosis not present

## 2018-01-07 DIAGNOSIS — M810 Age-related osteoporosis without current pathological fracture: Secondary | ICD-10-CM | POA: Diagnosis not present

## 2018-01-07 DIAGNOSIS — G629 Polyneuropathy, unspecified: Secondary | ICD-10-CM | POA: Diagnosis not present

## 2018-01-07 DIAGNOSIS — J449 Chronic obstructive pulmonary disease, unspecified: Secondary | ICD-10-CM | POA: Diagnosis not present

## 2018-01-07 DIAGNOSIS — J9611 Chronic respiratory failure with hypoxia: Secondary | ICD-10-CM | POA: Diagnosis not present

## 2018-01-07 DIAGNOSIS — I2510 Atherosclerotic heart disease of native coronary artery without angina pectoris: Secondary | ICD-10-CM | POA: Diagnosis not present

## 2018-01-14 DIAGNOSIS — J449 Chronic obstructive pulmonary disease, unspecified: Secondary | ICD-10-CM | POA: Diagnosis not present

## 2018-01-16 DIAGNOSIS — J449 Chronic obstructive pulmonary disease, unspecified: Secondary | ICD-10-CM | POA: Diagnosis not present

## 2018-01-21 ENCOUNTER — Telehealth: Payer: Self-pay | Admitting: Family Medicine

## 2018-01-22 ENCOUNTER — Other Ambulatory Visit: Payer: Self-pay

## 2018-01-22 ENCOUNTER — Ambulatory Visit: Payer: Medicare HMO | Attending: Nurse Practitioner | Admitting: Nurse Practitioner

## 2018-01-22 ENCOUNTER — Encounter: Payer: Self-pay | Admitting: Nurse Practitioner

## 2018-01-22 VITALS — BP 127/48 | HR 70 | Temp 97.9°F | Resp 16 | Ht <= 58 in | Wt 101.0 lb

## 2018-01-22 DIAGNOSIS — Z79891 Long term (current) use of opiate analgesic: Secondary | ICD-10-CM | POA: Diagnosis not present

## 2018-01-22 DIAGNOSIS — F410 Panic disorder [episodic paroxysmal anxiety] without agoraphobia: Secondary | ICD-10-CM | POA: Diagnosis not present

## 2018-01-22 DIAGNOSIS — Z79899 Other long term (current) drug therapy: Secondary | ICD-10-CM | POA: Insufficient documentation

## 2018-01-22 DIAGNOSIS — E559 Vitamin D deficiency, unspecified: Secondary | ICD-10-CM | POA: Insufficient documentation

## 2018-01-22 DIAGNOSIS — M797 Fibromyalgia: Secondary | ICD-10-CM | POA: Diagnosis not present

## 2018-01-22 DIAGNOSIS — G4700 Insomnia, unspecified: Secondary | ICD-10-CM | POA: Insufficient documentation

## 2018-01-22 DIAGNOSIS — M47816 Spondylosis without myelopathy or radiculopathy, lumbar region: Secondary | ICD-10-CM | POA: Diagnosis not present

## 2018-01-22 DIAGNOSIS — I739 Peripheral vascular disease, unspecified: Secondary | ICD-10-CM | POA: Diagnosis not present

## 2018-01-22 DIAGNOSIS — Z886 Allergy status to analgesic agent status: Secondary | ICD-10-CM | POA: Insufficient documentation

## 2018-01-22 DIAGNOSIS — F1190 Opioid use, unspecified, uncomplicated: Secondary | ICD-10-CM | POA: Diagnosis not present

## 2018-01-22 DIAGNOSIS — Z5181 Encounter for therapeutic drug level monitoring: Secondary | ICD-10-CM | POA: Diagnosis not present

## 2018-01-22 DIAGNOSIS — G894 Chronic pain syndrome: Secondary | ICD-10-CM

## 2018-01-22 DIAGNOSIS — I10 Essential (primary) hypertension: Secondary | ICD-10-CM | POA: Diagnosis not present

## 2018-01-22 DIAGNOSIS — M810 Age-related osteoporosis without current pathological fracture: Secondary | ICD-10-CM | POA: Insufficient documentation

## 2018-01-22 DIAGNOSIS — M546 Pain in thoracic spine: Secondary | ICD-10-CM | POA: Insufficient documentation

## 2018-01-22 DIAGNOSIS — I2510 Atherosclerotic heart disease of native coronary artery without angina pectoris: Secondary | ICD-10-CM | POA: Insufficient documentation

## 2018-01-22 DIAGNOSIS — F419 Anxiety disorder, unspecified: Secondary | ICD-10-CM | POA: Diagnosis not present

## 2018-01-22 DIAGNOSIS — Z882 Allergy status to sulfonamides status: Secondary | ICD-10-CM | POA: Insufficient documentation

## 2018-01-22 DIAGNOSIS — Z7902 Long term (current) use of antithrombotics/antiplatelets: Secondary | ICD-10-CM | POA: Insufficient documentation

## 2018-01-22 DIAGNOSIS — Z885 Allergy status to narcotic agent status: Secondary | ICD-10-CM | POA: Insufficient documentation

## 2018-01-22 DIAGNOSIS — G629 Polyneuropathy, unspecified: Secondary | ICD-10-CM | POA: Diagnosis not present

## 2018-01-22 DIAGNOSIS — E785 Hyperlipidemia, unspecified: Secondary | ICD-10-CM | POA: Insufficient documentation

## 2018-01-22 DIAGNOSIS — J449 Chronic obstructive pulmonary disease, unspecified: Secondary | ICD-10-CM | POA: Diagnosis not present

## 2018-01-22 DIAGNOSIS — Z9049 Acquired absence of other specified parts of digestive tract: Secondary | ICD-10-CM | POA: Diagnosis not present

## 2018-01-22 DIAGNOSIS — F17210 Nicotine dependence, cigarettes, uncomplicated: Secondary | ICD-10-CM | POA: Diagnosis not present

## 2018-01-22 DIAGNOSIS — K219 Gastro-esophageal reflux disease without esophagitis: Secondary | ICD-10-CM | POA: Diagnosis not present

## 2018-01-22 MED ORDER — FENTANYL 50 MCG/HR TD PT72: 50.0000 ug | MEDICATED_PATCH | TRANSDERMAL | 0 refills | Status: DC

## 2018-01-22 MED ORDER — TRAMADOL HCL 50 MG PO TABS: 100.0000 mg | ORAL_TABLET | Freq: Four times a day (QID) | ORAL | 0 refills | Status: DC | PRN

## 2018-01-22 MED ORDER — TRAMADOL HCL 50 MG PO TABS: 100.0000 mg | ORAL_TABLET | Freq: Four times a day (QID) | ORAL | 2 refills | Status: DC | PRN

## 2018-01-22 NOTE — Patient Instructions (Signed)

## 2018-01-22 NOTE — Progress Notes (Signed)
Nursing Pain Medication Assessment:  Safety precautions to be maintained throughout the outpatient stay will include: orient to surroundings, keep bed in low position, maintain call bell within reach at all times, provide assistance with transfer out of bed and ambulation.  Medication Inspection Compliance: Pill count conducted under aseptic conditions, in front of the patient. Neither the pills nor the bottle was removed from the patient's sight at any time. Once count was completed pills were immediately returned to the patient in their original bottle.  Medication #1: Tramadol (Ultram) Pill/Patch Count: 0 of 240 pills remain Pill/Patch Appearance: Markings consistent with prescribed medication Bottle Appearance: Standard pharmacy container. Clearly labeled. Filled Date: 01/26 / 2019 Last Medication intake:  Today  Medication #2: Fentanyl patch Pill/Patch Count: 0 of 10 pills remain Pill/Patch Appearance: Markings consistent with prescribed medication Bottle Appearance: Standard pharmacy container. Clearly labeled. Filled Date: 0130 / 2019 Last Medication intake:  Today

## 2018-01-22 NOTE — Progress Notes (Signed)
Patient's Name: Cheryl [name]  MRN: [id number]  Referring Provider: Birdie Sons, MD  DOB: 23-May-1951  PCP: Birdie Sons, MD  DOS: 01/22/2018  Note by: Vevelyn Francois NP  Service setting: Ambulatory outpatient  Specialty: Interventional Pain Management  Location: [facility] (AMB) Pain Management Facility    Patient type: Established    Primary Reason(s) for Visit: Encounter for prescription drug management. (Level of risk: moderate)  CC: Back Pain (lower)  HPI  Ms. Pomerleau is a 67 y.o. year old, female patient, who comes today for a medication management evaluation. She has Essential hypertension; GERD (gastroesophageal reflux disease); Hyperlipemia; Anxiety; Airway hyperreactivity; Back pain, thoracic; Coronary artery disease; Excessive falling; Alteration in bowel elimination: incontinence; Insomnia; Decreased testosterone level; Leg weakness; Menopausal symptom; Migraine; Neuropathy ([place]); Fecal occult blood test positive; OP (osteoporosis); Panic disorder; Compulsive tobacco user syndrome; Urinary incontinence; Vitamin D deficiency; Weight loss; COPD (chronic obstructive pulmonary disease) ([place]); Dementia; Lumbar radicular pain (Bilateral) (L>R) (L4); Chronic low back pain (Location of Primary Source of Pain) (Bilateral) (L>R); Encounter for therapeutic drug level monitoring; Uncomplicated opioid dependence ([place]); Chronic pain syndrome; Peripheral nerve disease; Peripheral vascular disease ([place]); Platelet inhibition due to Plavix; Abnormal mammogram of right breast; Dilated intrahepatic bile duct; Dysphagia; Acute diastolic CHF (congestive heart failure) ([place]); Leukocytosis; Long term current use of opiate analgesic; Fibromyalgia; Osteoarthrosis; Long term current use of anticoagulant therapy (Plavix); Opiate use (160 MME/Day); Anemia; Lumbar facet syndrome (Location of Primary Source of Pain) (Bilateral) (L>R); Lumbar spondylosis; Nausea with vomiting; Protein-calorie malnutrition, severe;  Metabolic encephalopathy; Hypokalemia; Acute postoperative pain; Pneumonia; Marijuana use; and Rib fracture on their problem list. Her primarily concern today is the Back Pain (lower)  Pain Assessment: Location: Lower Back Radiating: denies Onset: More than a month ago Duration: Chronic pain Quality: Throbbing Severity: 3 /10 (self-reported pain score)  Note: Reported level is compatible with observation.                          Timing: Constant Modifying factors: sitting  Ms. Neer was last scheduled for an appointment on 11/11/2017 for medication management. During today's appointment we reviewed Ms. Dimarco's chronic pain status, as well as her outpatient medication regimen. She denies any changes in her pain .  The patient  reports that she does not use drugs. Her body mass index is 21.11 kg/m.  Further details on both, my assessment(s), as well as the proposed treatment plan, please see below.  Controlled Substance Pharmacotherapy Assessment REMS (Risk Evaluation and Mitigation Strategy)  Analgesic:Duragesic 50 mcg/h every 72 hours + Tramadol 100 mg PO q6hrs (400 mg/day) MME/day:160 mg/day.    Landis Martins, RN  01/22/2018 11:14 AM  Sign at close encounter Nursing Pain Medication Assessment:  Safety precautions to be maintained throughout the outpatient stay will include: orient to surroundings, keep bed in low position, maintain call bell within reach at all times, provide assistance with transfer out of bed and ambulation.  Medication Inspection Compliance: Pill count conducted under aseptic conditions, in front of the patient. Neither the pills nor the bottle was removed from the patient's sight at any time. Once count was completed pills were immediately returned to the patient in their original bottle.  Medication #1: Tramadol (Ultram) Pill/Patch Count: 0 of 240 pills remain Pill/Patch Appearance: Markings consistent with prescribed medication Bottle Appearance:  Standard pharmacy container. Clearly labeled. Filled Date: 01/26 / 2019 Last Medication intake:  Today  Medication #2: Fentanyl patch Pill/Patch  Count: 0 of 10 pills remain Pill/Patch Appearance: Markings consistent with prescribed medication Bottle Appearance: Standard pharmacy container. Clearly labeled. Filled Date: 0130 / 2019 Last Medication intake:  Today   Pharmacokinetics: Liberation and absorption (onset of action): WNL Distribution (time to peak effect): WNL Metabolism and excretion (duration of action): WNL         Pharmacodynamics: Desired effects: Analgesia: Ms. Tagle reports >50% benefit. Functional ability: Patient reports that medication allows her to accomplish basic ADLs Clinically meaningful improvement in function (CMIF): Sustained CMIF goals met Perceived effectiveness: Described as relatively effective, allowing for increase in activities of daily living (ADL) Undesirable effects: Side-effects or Adverse reactions: None reported Monitoring: [place] PMP: Online review of the past 77-monthperiod conducted. Compliant with practice rules and regulations Last UDS on record: Summary  Date Value Ref Range Status  06/05/2017 FINAL  Final    Comment:    ==================================================================== TOXASSURE SELECT 13 (MW) ==================================================================== Test                             Result       Flag       Units Drug Present and Declared for Prescription Verification   Fentanyl                       33           EXPECTED   ng/mg creat   Norfentanyl                    321          EXPECTED   ng/mg creat    Source of fentanyl is a scheduled prescription medication,    including IV, patch, and transmucosal formulations. Norfentanyl    is an expected metabolite of fentanyl.   Tramadol                       PRESENT      EXPECTED   O-Desmethyltramadol            PRESENT      EXPECTED   N-Desmethyltramadol             PRESENT      EXPECTED    Source of tramadol is a prescription medication.    O-desmethyltramadol and N-desmethyltramadol are expected    metabolites of tramadol. Drug Absent but Declared for Prescription Verification   Alprazolam                     Not Detected UNEXPECTED ng/mg creat ==================================================================== Test                      Result    Flag   Units      Ref Range   Creatinine              33               mg/dL      >=20 ==================================================================== Declared Medications:  The flagging and interpretation on this report are based on the  following declared medications.  Unexpected results may arise from  inaccuracies in the declared medications.  **Note: The testing scope of this panel includes these medications:  Alprazolam (Xanax)  Fentanyl  Tramadol  **Note: The testing scope of this panel does not include following  reported medications:  Acetaminophen (Tylenol)  Albuterol  Atropine (Lomotil)  Celecoxib (Celebrex)  Clopidogrel (Plavix)  Diphenoxylate (Lomotil)  Donepezil (Aricept)  Estropipate (Ogen)  Fluticasone (Advair)  Mupirocin  Pantoprazole (Protonix)  Pregabalin (Lyrica)  Salmeterol (Advair)  Simvastatin (Zocor)  Sumatriptan (Imitrex)  Tiotropium (Spiriva)  Triamcinolone (Kenalog) ==================================================================== For clinical consultation, please call [phone]. ====================================================================    UDS interpretation: Compliant          Medication Assessment Form: Reviewed. Patient indicates being compliant with therapy Treatment compliance: Compliant Risk Assessment Profile: Aberrant behavior: See prior evaluations. None observed or detected today Comorbid factors increasing risk of overdose: See prior notes. No additional risks detected today Risk of substance use disorder (SUD):  Low  ORT Scoring interpretation table:  Score <3 = Low Risk for SUD  Score between 4-7 = Moderate Risk for SUD  Score >8 = High Risk for Opioid Abuse   Risk Mitigation Strategies:  Patient Counseling: Covered Patient-Prescriber Agreement (PPA): Present and active  Notification to other healthcare providers: Done  Pharmacologic Plan: No change in therapy, at this time.             Laboratory Chemistry  Inflammation Markers (CRP: Acute Phase) (ESR: Chronic Phase) Lab Results  Component Value Date   CRP 0.6 03/01/2016   ESRSEDRATE 52 (H) 03/01/2016   LATICACIDVEN 1.6 09/12/2017                         Rheumatology Markers Lab Results  Component Value Date   LABURIC 5.9 04/12/2016                Renal Function Markers Lab Results  Component Value Date   BUN 15 09/12/2017   CREATININE 1.16 (H) 09/12/2017   GFRAA 55 (L) 09/12/2017   GFRNONAA 47 (L) 09/12/2017                 Hepatic Function Markers Lab Results  Component Value Date   AST 17 09/12/2017   ALT 10 (L) 09/12/2017   ALBUMIN 3.3 (L) 09/12/2017   ALKPHOS 83 09/12/2017   AMYLASE 30 10/17/2015   LIPASE 16 09/12/2017   AMMONIA <9 (L) 09/12/2017                 Electrolytes Lab Results  Component Value Date   NA 137 09/12/2017   K 4.0 09/12/2017   CL 100 (L) 09/12/2017   CALCIUM 8.2 (L) 09/12/2017   MG 1.9 04/29/2017   PHOS 2.5 06/04/2016                        Neuropathy Markers Lab Results  Component Value Date   VITAMINB12 242 03/01/2016   HGBA1C 5.5 04/30/2017                 Bone Pathology Markers Lab Results  Component Value Date   VD25OH 25.6 (L) 01/17/2016   VD125OH2TOT 27 07/04/2017   [id number] 24 07/04/2017   [id number] <10 07/04/2017                         Coagulation Parameters Lab Results  Component Value Date   INR 0.96 09/12/2017   LABPROT 12.7 09/12/2017   APTT 38 (H) 10/17/2015   PLT 221 09/12/2017   DDIMER  10/30/2009    0.32        AT THE INHOUSE ESTABLISHED  CUTOFF VALUE OF 0.48 ug/mL FEU, THIS ASSAY HAS BEEN DOCUMENTED IN THE LITERATURE  TO HAVE A SENSITIVITY AND NEGATIVE PREDICTIVE VALUE OF AT LEAST 98 TO 99%.  THE TEST RESULT SHOULD BE CORRELATED WITH AN ASSESSMENT OF THE CLINICAL PROBABILITY OF DVT / VTE.                 Cardiovascular Markers Lab Results  Component Value Date   BNP 137.0 (H) 05/14/2017   CKTOTAL 65 04/03/2013   CKMB 1.4 04/03/2013   TROPONINI <0.03 09/12/2017   HGB 9.1 (L) 09/12/2017   HCT 27.6 (L) 09/12/2017                 CA Markers No results found for: CEA, CA125, LABCA2               Note: Lab results reviewed.  Recent Diagnostic Imaging Results  DG Chest 2 View CLINICAL DATA:  Acute right chest pain following fall last week.  EXAM: CHEST  2 VIEW  COMPARISON:  09/19/2017 and prior radiographs  FINDINGS: Acute fracture of the anterior right ninth rib is again noted.  Remote fractures of the right eighth through tenth ribs again identified.  The cardiomediastinal silhouette is unchanged.  No airspace disease, pleural effusion or pneumothorax.  IMPRESSION: Unchanged appearance of the chest with anterior right ninth rib fracture again noted.  Electronically Signed   By: Margarette Canada M.D.   On: 09/25/2017 13:47  Complexity Note: Imaging results reviewed. Results shared with Ms. Casale, using Layman's terms.                         Meds   Current Outpatient Medications:  .  acetaminophen (TYLENOL) 325 MG tablet, Take 2 tablets (650 mg total) by mouth every 6 (six) hours as needed for mild pain (or Fever >/= 101)., Disp: 30 tablet, Rfl: 0 .  ADVAIR DISKUS 250-50 MCG/DOSE AEPB, inhale 1 dose by mouth twice a day, Disp: 60 each, Rfl: 5 .  albuterol (PROVENTIL HFA;VENTOLIN HFA) 108 (90 BASE) MCG/ACT inhaler, Inhale 2 puffs into the lungs every 4 (four) hours as needed for wheezing or shortness of breath., Disp: 18 g, Rfl: 3 .  albuterol (PROVENTIL) (2.5 MG/3ML) 0.083% nebulizer solution, Take  3 mLs (2.5 mg total) by nebulization every 4 (four) hours as needed for wheezing., Disp: 50 vial, Rfl: 12 .  alendronate (FOSAMAX) 70 MG tablet, Take 1 tablet (70 mg total) by mouth every 7 (seven) days. Take with a full glass of water on an empty stomach., Disp: 4 tablet, Rfl: 11 .  ALPRAZolam (XANAX) 0.5 MG tablet, take 1 tablet by mouth three times a day if needed, Disp: 60 tablet, Rfl: 5 .  benzonatate (TESSALON) 100 MG capsule, Take 1 capsule (100 mg total) by mouth 2 (two) times daily as needed for cough., Disp: 20 capsule, Rfl: 1 .  celecoxib (CELEBREX) 100 MG capsule, Take 1 capsule (100 mg total) by mouth 2 (two) times daily as needed., Disp: 60 capsule, Rfl: 1 .  clopidogrel (PLAVIX) 75 MG tablet, Take 75 mg by mouth daily., Disp: , Rfl:  .  diphenoxylate-atropine (LOMOTIL) 2.5-0.025 MG per tablet, Take 2 tablets by mouth 4 (four) times daily as needed for diarrhea or loose stools. Reported on 03/11/2016, Disp: , Rfl:  .  donepezil (ARICEPT) 10 MG tablet, Take 10 mg by mouth at bedtime., Disp: , Rfl:  .  estropipate (OGEN) 0.75 MG tablet, take 1 tablet by mouth every other day, Disp: 30 tablet, Rfl: 3 .  [START ON  04/22/2018] fentaNYL (DURAGESIC - DOSED MCG/HR) 50 MCG/HR, Place 1 patch (50 mcg total) onto the skin every 3 (three) days., Disp: 10 patch, Rfl: 0 .  [START ON 03/23/2018] fentaNYL (DURAGESIC - DOSED MCG/HR) 50 MCG/HR, Place 1 patch (50 mcg total) onto the skin every 3 (three) days., Disp: 10 patch, Rfl: 0 .  furosemide (LASIX) 40 MG tablet, Take 40 mg by mouth every other day. , Disp: , Rfl:  .  LYRICA 300 MG capsule, Take 300 mg by mouth 2 (two) times daily., Disp: , Rfl: 0 .  mupirocin ointment (BACTROBAN) 2 %, Place 1 application into the nose 2 (two) times daily., Disp: 30 g, Rfl: 2 .  NARCAN 4 MG/0.1ML LIQD nasal spray kit, Place 0.4 mg into the nose once. , Disp: , Rfl:  .  pantoprazole (PROTONIX) 40 MG tablet, take 1 tablet by mouth twice a day, Disp: 60 tablet, Rfl: 12 .   simvastatin (ZOCOR) 10 MG tablet, Take 10 mg by mouth at bedtime. Reported on 12/04/2015, Disp: , Rfl:  .  SUMAtriptan (IMITREX) 25 MG tablet, Take 1 tablet (25 mg total) by mouth as needed for migraine. May repeat in 2 hours if headache persists or recurs., Disp: 10 tablet, Rfl: 5 .  tiotropium (SPIRIVA HANDIHALER) 18 MCG inhalation capsule, inhale the contents of one capsule in the handihaler once daily, Disp: 30 capsule, Rfl: 12 .  [START ON 02/21/2018] traMADol (ULTRAM) 50 MG tablet, Take 2 tablets (100 mg total) by mouth every 6 (six) hours as needed for moderate pain or severe pain., Disp: 240 tablet, Rfl: 2 .  triamcinolone ointment (KENALOG) 0.5 %, Apply to lesions on hands twice a day as needed, Disp: 30 g, Rfl: 1 .  [START ON 02/21/2018] fentaNYL (DURAGESIC - DOSED MCG/HR) 50 MCG/HR, Place 1 patch (50 mcg total) onto the skin every 3 (three) days., Disp: 10 patch, Rfl: 0 .  fentaNYL (DURAGESIC - DOSED MCG/HR) 50 MCG/HR, Place 1 patch (50 mcg total) onto the skin every 3 (three) days., Disp: 10 patch, Rfl: 0 .  traMADol (ULTRAM) 50 MG tablet, Take 2 tablets (100 mg total) by mouth every 6 (six) hours as needed for moderate pain or severe pain., Disp: 240 tablet, Rfl: 0  ROS  Constitutional: Denies any fever or chills Gastrointestinal: No reported hemesis, hematochezia, vomiting, or acute GI distress Musculoskeletal: Denies any acute onset joint swelling, redness, loss of ROM, or weakness Neurological: No reported episodes of acute onset apraxia, aphasia, dysarthria, agnosia, amnesia, paralysis, loss of coordination, or loss of consciousness  Allergies  Ms. Braniff is allergic to percocet [oxycodone-acetaminophen]; aspirin; codeine; propoxyphene; and sulfa antibiotics.  [place]  Drug: Ms. Matchett  reports that she does not use drugs. Alcohol:  reports that she does not drink alcohol. Tobacco:  reports that she has been smoking cigarettes.  She has a 25.00 pack-year smoking history. she has never  used smokeless tobacco. Medical:  has a past medical history of Allergy, Anxiety, Arthritis, Aspiration pneumonitis ([place]) (11/24/2015), Asthma, Chronic pain, COPD (chronic obstructive pulmonary disease) ([place]), Coronary artery disease, DVT (deep venous thrombosis) ([place]), GERD (gastroesophageal reflux disease), Headache, Hyperlipidemia, Hypertension, Kyphoscoliosis deformity of spine, Migraines, Neuropathy (2010), Osteoporosis, Oxygen deficiency, Peripheral vascular disease ([place]), Pneumonia, Pneumonia (11/19/2015), Pneumonia (10/2016), Pneumonia, and Vitamin D deficiency. Surgical: Ms. Narducci  has a past surgical history that includes Appendectomy; Spine surgery; Foot surgery (Bilateral); cardiac catherization (10/31/2009); abdomnal aortic stent (05/30/2008); Abdominal hysterectomy (1975); Cholecystectomy (1972); Cervical fusion (C5 - 6/C6-7); Appendectomy; Colonoscopy with  propofol (N/A, 07/27/2015); and Esophagogastroduodenoscopy (egd) with propofol (N/A, 07/27/2015). Family: family history includes Arthritis in her mother; Cancer in her brother, brother, and mother; Diabetes in her brother, mother, and sister; Heart disease in her father and mother.  Constitutional Exam  General appearance: Well nourished, well developed, and well hydrated. In no apparent acute distress Vitals:   01/22/18 1106  BP: (!) 127/48  Pulse: 70  Resp: 16  Temp: 97.9 F (36.6 C)  TempSrc: Oral  SpO2: 100%  Weight: 101 lb (45.8 kg)  Height: '4\' 10"'$  (1.473 m)   BMI Assessment: Estimated body mass index is 21.11 kg/m as calculated from the following:   Height as of this encounter: '4\' 10"'$  (1.473 m).   Weight as of this encounter: 101 lb (45.8 kg).  BMI interpretation table: BMI level Category Range association with higher incidence of chronic pain  <18 kg/m2 Underweight   18.5-24.9 kg/m2 Ideal body weight   25-29.9 kg/m2 Overweight Increased incidence by 20%  30-34.9 kg/m2 Obese (Class I) Increased incidence by 68%   35-39.9 kg/m2 Severe obesity (Class II) Increased incidence by 136%  >40 kg/m2 Extreme obesity (Class III) Increased incidence by 254%   BMI Readings from Last 4 Encounters:  01/22/18 21.11 kg/m  12/02/17 20.48 kg/m  11/24/17 20.27 kg/m  11/11/17 19.44 kg/m   Wt Readings from Last 4 Encounters:  01/22/18 101 lb (45.8 kg)  12/02/17 98 lb (44.5 kg)  11/24/17 97 lb (44 kg)  11/11/17 93 lb (42.2 kg)  Psych/Mental status: Alert, oriented x 3 (person, place, & time)       Eyes: PERLA Respiratory: No evidence of acute respiratory distress  Cervical Spine Area Exam  Skin & Axial Inspection: No masses, redness, edema, swelling, or associated skin lesions Alignment: Symmetrical Functional ROM: Unrestricted ROM      Stability: No instability detected Muscle Tone/Strength: Functionally intact. No obvious neuro-muscular anomalies detected. Sensory (Neurological): Unimpaired Palpation: No palpable anomalies              Upper Extremity (UE) Exam    Side: Right upper extremity  Side: Left upper extremity  Skin & Extremity Inspection: Skin color, temperature, and hair growth are WNL. No peripheral edema or cyanosis. No masses, redness, swelling, asymmetry, or associated skin lesions. No contractures.  Skin & Extremity Inspection: Skin color, temperature, and hair growth are WNL. No peripheral edema or cyanosis. No masses, redness, swelling, asymmetry, or associated skin lesions. No contractures.  Functional ROM: Unrestricted ROM          Functional ROM: Unrestricted ROM          Muscle Tone/Strength: Functionally intact. No obvious neuro-muscular anomalies detected.  Muscle Tone/Strength: Functionally intact. No obvious neuro-muscular anomalies detected.  Sensory (Neurological): Unimpaired          Sensory (Neurological): Unimpaired          Palpation: No palpable anomalies              Palpation: No palpable anomalies              Specialized Test(s): Deferred         Specialized Test(s):  Deferred          Thoracic Spine Area Exam  Skin & Axial Inspection: No masses, redness, or swelling Alignment: Symmetrical Functional ROM: Unrestricted ROM Stability: No instability detected Muscle Tone/Strength: Functionally intact. No obvious neuro-muscular anomalies detected. Sensory (Neurological): Unimpaired Muscle strength & Tone: No palpable anomalies  Lumbar Spine Area Exam  Skin &  Axial Inspection: No masses, redness, or swelling Alignment: Symmetrical Functional ROM: Unrestricted ROM      Stability: No instability detected Muscle Tone/Strength: Functionally intact. No obvious neuro-muscular anomalies detected. Sensory (Neurological): Unimpaired Palpation: No palpable anomalies       Provocative Tests: Lumbar Hyperextension and rotation test: evaluation deferred today       Lumbar Lateral bending test: evaluation deferred today       Patrick's Maneuver: evaluation deferred today                    Gait & Posture Assessment  Ambulation: Patient ambulates using a walker Gait: Relatively normal for age and body habitus Posture: Thoracic kyphosis   Lower Extremity Exam    Side: Right lower extremity  Side: Left lower extremity  Skin & Extremity Inspection: Skin color, temperature, and hair growth are WNL. No peripheral edema or cyanosis. No masses, redness, swelling, asymmetry, or associated skin lesions. No contractures.  Skin & Extremity Inspection: Skin color, temperature, and hair growth are WNL. No peripheral edema or cyanosis. No masses, redness, swelling, asymmetry, or associated skin lesions. No contractures.  Functional ROM: Unrestricted ROM          Functional ROM: Unrestricted ROM          Muscle Tone/Strength: Functionally intact. No obvious neuro-muscular anomalies detected.  Muscle Tone/Strength: Functionally intact. No obvious neuro-muscular anomalies detected.  Sensory (Neurological): Unimpaired  Sensory (Neurological): Unimpaired  Palpation: No palpable  anomalies  Palpation: No palpable anomalies   Assessment  Primary Diagnosis & Pertinent Problem List: The primary encounter diagnosis was Lumbar spondylosis. Diagnoses of Lumbar facet syndrome (Location of Primary Source of Pain) (Bilateral) (L>R), Fibromyalgia, Chronic pain syndrome, and Opiate use (160 MME/Day) were also pertinent to this visit.  Status Diagnosis  Controlled Controlled Controlled 1. Lumbar spondylosis   2. Lumbar facet syndrome (Location of Primary Source of Pain) (Bilateral) (L>R)   3. Fibromyalgia   4. Chronic pain syndrome   5. Opiate use (160 MME/Day)     Problems updated and reviewed during this visit: No problems updated. Plan of Care  Pharmacotherapy (Medications Ordered): Meds ordered this encounter  Medications  . fentaNYL (DURAGESIC - DOSED MCG/HR) 50 MCG/HR    Sig: Place 1 patch (50 mcg total) onto the skin every 3 (three) days.    Dispense:  10 patch    Refill:  0    Patient may have prescription filled one day early if pharmacy is closed on scheduled refill date. Do not fill until: 04/22/2018 To last until: 05/22/2018    Order Specific Question:   Supervising Provider    Answer:   Milinda Pointer [phone]  . fentaNYL (DURAGESIC - DOSED MCG/HR) 50 MCG/HR    Sig: Place 1 patch (50 mcg total) onto the skin every 3 (three) days.    Dispense:  10 patch    Refill:  0    Patient may have prescription filled one day early if pharmacy is closed on scheduled refill date. Do not fill until: 03/23/2018 To last until: 04/22/2018    Order Specific Question:   Supervising Provider    Answer:   Milinda Pointer [phone]  . fentaNYL (DURAGESIC - DOSED MCG/HR) 50 MCG/HR    Sig: Place 1 patch (50 mcg total) onto the skin every 3 (three) days.    Dispense:  10 patch    Refill:  0    Patient may have prescription filled one day early if pharmacy is closed on scheduled  refill date. Do not fill until: 02/21/2018 To last until: 03/23/2018    Order Specific Question:    Supervising Provider    Answer:   Milinda Pointer [phone]  . traMADol (ULTRAM) 50 MG tablet    Sig: Take 2 tablets (100 mg total) by mouth every 6 (six) hours as needed for moderate pain or severe pain.    Dispense:  240 tablet    Refill:  2    Patient may have prescription filled one day early if pharmacy is closed on scheduled refill date. Do not fill until: 02/21/2018 To last until: 05/22/2018    Order Specific Question:   Supervising Provider    Answer:   Milinda Pointer [phone]  . fentaNYL (DURAGESIC - DOSED MCG/HR) 50 MCG/HR    Sig: Place 1 patch (50 mcg total) onto the skin every 3 (three) days.    Dispense:  10 patch    Refill:  0    Order Specific Question:   Supervising Provider    Answer:   Milinda Pointer [phone]  . traMADol (ULTRAM) 50 MG tablet    Sig: Take 2 tablets (100 mg total) by mouth every 6 (six) hours as needed for moderate pain or severe pain.    Dispense:  240 tablet    Refill:  0    Order Specific Question:   Supervising Provider    AnswerMilinda Pointer [phone]   New Prescriptions   No medications on file   Medications administered today: Lesle Reek had no medications administered during this visit. Lab-work, procedure(s), and/or referral(s): Orders Placed This Encounter  Procedures  . ToxASSURE Select 13 (MW), Urine  . Drug Screen 10 W/Conf, Serum   Imaging and/or referral(s): None  Interventional therapies: Planned, scheduled, and/or pending:   Not at this time.  Provider-requested follow-up: Return in 3 months (on 05/06/2018) for MedMgmt with Me Donella Stade [facility]).  Future Appointments  Date Time Provider [place]  02/02/2018 10:30 AM Vevelyn Francois, NP [facility] None  04/28/2018 10:45 AM Vevelyn Francois, NP [facility] None   Primary Care Physician: Birdie Sons, MD Location: [facility] Note by: Vevelyn Francois NP Date: 01/22/2018; Time: 1:11 PM  Pain Score Disclaimer: We use the  NRS-11 scale. This is a self-reported, subjective measurement of pain severity with only modest accuracy. It is used primarily to identify changes within a particular patient. It must be understood that outpatient pain scales are significantly less accurate that those used for research, where they can be applied under ideal controlled circumstances with minimal exposure to variables. In reality, the score is likely to be a combination of pain intensity and pain affect, where pain affect describes the degree of emotional arousal or changes in action readiness caused by the sensory experience of pain. Factors such as social and work situation, setting, emotional state, anxiety levels, expectation, and prior pain experience may influence pain perception and show large inter-individual differences that may also be affected by time variables.  Patient instructions provided during this appointment: Patient Instructions  ____________________________________________________________________________________________  Medication Rules  Applies to: All patients receiving prescriptions (written or electronic).  Pharmacy of record: Pharmacy where electronic prescriptions will be sent. If written prescriptions are taken to a different pharmacy, please inform the nursing staff. The pharmacy listed in the electronic medical record should be the one where you would like electronic prescriptions to be sent.  Prescription refills: Only during scheduled appointments. Applies to both, written and electronic  prescriptions.  NOTE: The following applies primarily to controlled substances (Opioid* Pain Medications).   Patient's responsibilities: 1. Pain Pills: Bring all pain pills to every appointment (except for procedure appointments). 2. Pill Bottles: Bring pills in original pharmacy bottle. Always bring newest bottle. Bring bottle, even if empty. 3. Medication refills: You are responsible for knowing and keeping track  of what medications you need refilled. The day before your appointment, write a list of all prescriptions that need to be refilled. Bring that list to your appointment and give it to the admitting nurse. Prescriptions will be written only during appointments. If you forget a medication, it will not be "Called in", "Faxed", or "electronically sent". You will need to get another appointment to get these prescribed. 4. Prescription Accuracy: You are responsible for carefully inspecting your prescriptions before leaving our office. Have the discharge nurse carefully go over each prescription with you, before taking them home. Make sure that your name is accurately spelled, that your address is correct. Check the name and dose of your medication to make sure it is accurate. Check the number of pills, and the written instructions to make sure they are clear and accurate. Make sure that you are given enough medication to last until your next medication refill appointment. 5. Taking Medication: Take medication as prescribed. Never take more pills than instructed. Never take medication more frequently than prescribed. Taking less pills or less frequently is permitted and encouraged, when it comes to controlled substances (written prescriptions).  6. Inform other Doctors: Always inform, all of your healthcare providers, of all the medications you take. 7. Pain Medication from other Providers: You are not allowed to accept any additional pain medication from any other Doctor or Healthcare provider. There are two exceptions to this rule. (see below) In the event that you require additional pain medication, you are responsible for notifying us, as stated below. 8. Medication Agreement: You are responsible for carefully reading and following our Medication Agreement. This must be signed before receiving any prescriptions from our practice. Safely store a copy of your signed Agreement. Violations to the Agreement will result  in no further prescriptions. (Additional copies of our Medication Agreement are available upon request.) 9. Laws, Rules, & Regulations: All patients are expected to follow all Federal and [employer], Rules, [employer]. Ignorance of the Laws does not constitute a valid excuse. The use of any illegal substances is prohibited. 10. Adopted CDC guidelines & recommendations: Target dosing levels will be at or below 60 MME/day. Use of benzodiazepines** is not recommended.  Exceptions: There are only two exceptions to the rule of not receiving pain medications from other Healthcare Providers. 1. Exception #1 (Emergencies): In the event of an emergency (i.e.: accident requiring emergency care), you are allowed to receive additional pain medication. However, you are responsible for: As soon as you are able, call our office [phone], at any time of the day or night, and leave a message stating your name, the date and nature of the emergency, and the name and dose of the medication prescribed. In the event that your call is answered by a member of our staff, make sure to document and save the date, time, and the name of the person that took your information.  2. Exception #2 (Planned Surgery): In the event that you are scheduled by another doctor or dentist to have any type of surgery or procedure, you are allowed (for a period no longer than 30 days), to receive  additional pain medication, for the acute post-op pain. However, in this case, you are responsible for picking up a copy of our "Post-op Pain Management for Surgeons" handout, and giving it to your surgeon or dentist. This document is available at our office, and does not require an appointment to obtain it. Simply go to our office during business hours (Monday-Thursday from 8:00 AM to 4:00 PM) (Friday 8:00 AM to 12:00 Noon) or if you have a scheduled appointment with [place], prior to your surgery, and ask for it by name. In addition, you will need  to provide [place] with your name, name of your surgeon, type of surgery, and date of procedure or surgery.  *Opioid medications include: morphine, codeine, oxycodone, oxymorphone, hydrocodone, hydromorphone, meperidine, tramadol, tapentadol, buprenorphine, fentanyl, methadone. **Benzodiazepine medications include: diazepam (Valium), alprazolam (Xanax), clonazepam (Klonopine), lorazepam (Ativan), clorazepate (Tranxene), chlordiazepoxide (Librium), estazolam (Prosom), oxazepam (Serax), temazepam (Restoril), triazolam (Halcion) ____________________________________________________________________________________________

## 2018-01-22 NOTE — Telephone Encounter (Signed)
Was there supposed to be message with this?sa

## 2018-01-23 ENCOUNTER — Other Ambulatory Visit: Payer: Self-pay | Admitting: *Deleted

## 2018-01-23 DIAGNOSIS — M810 Age-related osteoporosis without current pathological fracture: Secondary | ICD-10-CM

## 2018-01-23 DIAGNOSIS — M79604 Pain in right leg: Secondary | ICD-10-CM

## 2018-01-23 MED ORDER — ALPRAZOLAM 0.5 MG PO TABS: ORAL_TABLET | ORAL | 5 refills | Status: DC

## 2018-01-23 MED ORDER — ALENDRONATE SODIUM 70 MG PO TABS: 70.0000 mg | ORAL_TABLET | ORAL | 11 refills | Status: DC

## 2018-01-23 MED ORDER — ESTROPIPATE 0.75 MG PO TABS: 0.7500 mg | ORAL_TABLET | ORAL | 3 refills | Status: DC

## 2018-01-23 MED ORDER — ALBUTEROL SULFATE HFA 108 (90 BASE) MCG/ACT IN AERS: 2.0000 | INHALATION_SPRAY | RESPIRATORY_TRACT | 3 refills | Status: DC | PRN

## 2018-01-23 MED ORDER — FLUTICASONE-SALMETEROL 250-50 MCG/DOSE IN AEPB: INHALATION_SPRAY | RESPIRATORY_TRACT | 5 refills | Status: DC

## 2018-01-23 MED ORDER — TIOTROPIUM BROMIDE MONOHYDRATE 18 MCG IN CAPS: ORAL_CAPSULE | RESPIRATORY_TRACT | 12 refills | Status: DC

## 2018-01-23 MED ORDER — PANTOPRAZOLE SODIUM 40 MG PO TBEC: 40.0000 mg | DELAYED_RELEASE_TABLET | Freq: Two times a day (BID) | ORAL | 12 refills | Status: DC

## 2018-01-23 MED ORDER — CELECOXIB 100 MG PO CAPS: 100.0000 mg | ORAL_CAPSULE | Freq: Two times a day (BID) | ORAL | 5 refills | Status: DC | PRN

## 2018-01-23 NOTE — Telephone Encounter (Signed)
Rx request was sent to provider today.

## 2018-01-23 NOTE — Telephone Encounter (Signed)
Her husband called wanting all of her medications sent to [facility] [place] [facility].I advised him to get pharmacy to fax request with all meds pt needs at present time.There is a long list of meds in pt's chart and he did not know what was needed

## 2018-01-24 LAB — DRUG SCREEN 10 W/CONF, SERUM
AMPHETAMINES, IA: NEGATIVE ng/mL
BENZODIAZEPINES, IA: NEGATIVE ng/mL
Barbiturates, IA: NEGATIVE ug/mL
COCAINE & METABOLITE, IA: NEGATIVE ng/mL
Methadone, IA: NEGATIVE ng/mL
OPIATES, IA: NEGATIVE ng/mL
OXYCODONES, IA: NEGATIVE ng/mL
PHENCYCLIDINE, IA: NEGATIVE ng/mL
Propoxyphene, IA: NEGATIVE ng/mL
THC(Marijuana) Metabolite, IA: NEGATIVE ng/mL

## 2018-01-26 DIAGNOSIS — G3184 Mild cognitive impairment, so stated: Secondary | ICD-10-CM | POA: Diagnosis not present

## 2018-01-26 DIAGNOSIS — G609 Hereditary and idiopathic neuropathy, unspecified: Secondary | ICD-10-CM | POA: Diagnosis not present

## 2018-01-26 DIAGNOSIS — G43019 Migraine without aura, intractable, without status migrainosus: Secondary | ICD-10-CM | POA: Diagnosis not present

## 2018-01-26 DIAGNOSIS — R296 Repeated falls: Secondary | ICD-10-CM | POA: Diagnosis not present

## 2018-01-26 MED ORDER — CYCLOBENZAPRINE HCL 5 MG PO TABS: 5.0000 mg | ORAL_TABLET | Freq: Three times a day (TID) | ORAL | 0 refills | Status: DC | PRN

## 2018-01-26 MED ORDER — SILVER SULFADIAZINE 1 % EX CREA: TOPICAL_CREAM | Freq: Every day | CUTANEOUS | 3 refills | Status: DC

## 2018-02-02 ENCOUNTER — Ambulatory Visit: Payer: Medicare HMO | Admitting: Nurse Practitioner

## 2018-02-02 ENCOUNTER — Other Ambulatory Visit: Payer: Self-pay | Admitting: Family Medicine

## 2018-02-05 ENCOUNTER — Other Ambulatory Visit: Payer: Self-pay

## 2018-02-05 ENCOUNTER — Encounter: Payer: Medicare HMO | Admitting: Nurse Practitioner

## 2018-02-05 ENCOUNTER — Emergency Department: Payer: Medicare HMO

## 2018-02-05 ENCOUNTER — Encounter: Payer: Self-pay | Admitting: *Deleted

## 2018-02-05 ENCOUNTER — Inpatient Hospital Stay
Admission: EM | Admit: 2018-02-05 | Discharge: 2018-02-07 | DRG: 871 | Disposition: A | Discharge status: Home Health | Payer: Medicare HMO | Attending: Internal Medicine | Admitting: Internal Medicine

## 2018-02-05 DIAGNOSIS — M1990 Unspecified osteoarthritis, unspecified site: Secondary | ICD-10-CM | POA: Diagnosis present

## 2018-02-05 DIAGNOSIS — A4189 Other specified sepsis: Principal | ICD-10-CM | POA: Diagnosis present

## 2018-02-05 DIAGNOSIS — Z791 Long term (current) use of non-steroidal anti-inflammatories (NSAID): Secondary | ICD-10-CM

## 2018-02-05 DIAGNOSIS — G894 Chronic pain syndrome: Secondary | ICD-10-CM | POA: Diagnosis present

## 2018-02-05 DIAGNOSIS — J9621 Acute and chronic respiratory failure with hypoxia: Secondary | ICD-10-CM | POA: Diagnosis not present

## 2018-02-05 DIAGNOSIS — Z86718 Personal history of other venous thrombosis and embolism: Secondary | ICD-10-CM

## 2018-02-05 DIAGNOSIS — Z885 Allergy status to narcotic agent status: Secondary | ICD-10-CM | POA: Diagnosis not present

## 2018-02-05 DIAGNOSIS — Z886 Allergy status to analgesic agent status: Secondary | ICD-10-CM

## 2018-02-05 DIAGNOSIS — F17210 Nicotine dependence, cigarettes, uncomplicated: Secondary | ICD-10-CM | POA: Diagnosis present

## 2018-02-05 DIAGNOSIS — M810 Age-related osteoporosis without current pathological fracture: Secondary | ICD-10-CM | POA: Diagnosis present

## 2018-02-05 DIAGNOSIS — I10 Essential (primary) hypertension: Secondary | ICD-10-CM | POA: Diagnosis present

## 2018-02-05 DIAGNOSIS — F0390 Unspecified dementia without behavioral disturbance: Secondary | ICD-10-CM | POA: Diagnosis not present

## 2018-02-05 DIAGNOSIS — Z9981 Dependence on supplemental oxygen: Secondary | ICD-10-CM | POA: Diagnosis not present

## 2018-02-05 DIAGNOSIS — M419 Scoliosis, unspecified: Secondary | ICD-10-CM | POA: Diagnosis present

## 2018-02-05 DIAGNOSIS — Z7902 Long term (current) use of antithrombotics/antiplatelets: Secondary | ICD-10-CM | POA: Diagnosis not present

## 2018-02-05 DIAGNOSIS — J101 Influenza due to other identified influenza virus with other respiratory manifestations: Secondary | ICD-10-CM | POA: Diagnosis not present

## 2018-02-05 DIAGNOSIS — G92 Toxic encephalopathy: Secondary | ICD-10-CM | POA: Diagnosis present

## 2018-02-05 DIAGNOSIS — G9340 Encephalopathy, unspecified: Secondary | ICD-10-CM | POA: Diagnosis not present

## 2018-02-05 DIAGNOSIS — I739 Peripheral vascular disease, unspecified: Secondary | ICD-10-CM | POA: Diagnosis present

## 2018-02-05 DIAGNOSIS — J441 Chronic obstructive pulmonary disease with (acute) exacerbation: Secondary | ICD-10-CM | POA: Diagnosis present

## 2018-02-05 DIAGNOSIS — A419 Sepsis, unspecified organism: Secondary | ICD-10-CM

## 2018-02-05 DIAGNOSIS — J1008 Influenza due to other identified influenza virus with other specified pneumonia: Secondary | ICD-10-CM | POA: Diagnosis not present

## 2018-02-05 DIAGNOSIS — R531 Weakness: Secondary | ICD-10-CM | POA: Diagnosis not present

## 2018-02-05 DIAGNOSIS — Z981 Arthrodesis status: Secondary | ICD-10-CM

## 2018-02-05 DIAGNOSIS — Z882 Allergy status to sulfonamides status: Secondary | ICD-10-CM | POA: Diagnosis not present

## 2018-02-05 DIAGNOSIS — J440 Chronic obstructive pulmonary disease with acute lower respiratory infection: Secondary | ICD-10-CM | POA: Diagnosis present

## 2018-02-05 DIAGNOSIS — Z7983 Long term (current) use of bisphosphonates: Secondary | ICD-10-CM

## 2018-02-05 DIAGNOSIS — Z79899 Other long term (current) drug therapy: Secondary | ICD-10-CM

## 2018-02-05 DIAGNOSIS — Z7951 Long term (current) use of inhaled steroids: Secondary | ICD-10-CM

## 2018-02-05 DIAGNOSIS — E785 Hyperlipidemia, unspecified: Secondary | ICD-10-CM | POA: Diagnosis present

## 2018-02-05 DIAGNOSIS — J449 Chronic obstructive pulmonary disease, unspecified: Secondary | ICD-10-CM | POA: Diagnosis not present

## 2018-02-05 DIAGNOSIS — G43909 Migraine, unspecified, not intractable, without status migrainosus: Secondary | ICD-10-CM | POA: Diagnosis present

## 2018-02-05 DIAGNOSIS — J181 Lobar pneumonia, unspecified organism: Secondary | ICD-10-CM | POA: Diagnosis not present

## 2018-02-05 DIAGNOSIS — R000 Tachycardia, unspecified: Secondary | ICD-10-CM | POA: Diagnosis not present

## 2018-02-05 DIAGNOSIS — G629 Polyneuropathy, unspecified: Secondary | ICD-10-CM | POA: Diagnosis not present

## 2018-02-05 DIAGNOSIS — J189 Pneumonia, unspecified organism: Secondary | ICD-10-CM

## 2018-02-05 DIAGNOSIS — J9601 Acute respiratory failure with hypoxia: Secondary | ICD-10-CM | POA: Diagnosis not present

## 2018-02-05 LAB — PROCALCITONIN: Procalcitonin: <0.1 ng/mL

## 2018-02-05 LAB — URINALYSIS, ROUTINE W REFLEX MICROSCOPIC
BILIRUBIN URINE: NEGATIVE
Bacteria, UA: NONE SEEN
GLUCOSE, UA: NEGATIVE mg/dL
Ketones, ur: 20 mg/dL — AB
LEUKOCYTES UA: NEGATIVE
Nitrite: NEGATIVE
PH: 6 (ref 5.0–8.0)
Protein, ur: NEGATIVE mg/dL
Specific Gravity, Urine: 1.016 (ref 1.005–1.030)

## 2018-02-05 LAB — COMPREHENSIVE METABOLIC PANEL
ALBUMIN: 3.8 g/dL (ref 3.5–5.0)
ALT: 9 U/L — AB (ref 14–54)
AST: 17 U/L (ref 15–41)
Alkaline Phosphatase: 87 U/L (ref 38–126)
Anion gap: 12 (ref 5–15)
BUN: 17 mg/dL (ref 6–20)
CO2: 25 mmol/L (ref 22–32)
CREATININE: 0.9 mg/dL (ref 0.44–1.00)
Calcium: 8.5 mg/dL — ABNORMAL LOW (ref 8.9–10.3)
Chloride: 99 mmol/L — ABNORMAL LOW (ref 101–111)
GFR calc Af Amer: >60 mL/min (ref >60)
GFR calc non Af Amer: >60 mL/min (ref >60)
GLUCOSE: 85 mg/dL (ref 65–99)
Potassium: 3.8 mmol/L (ref 3.5–5.1)
SODIUM: 136 mmol/L (ref 135–145)
Total Bilirubin: 0.6 mg/dL (ref 0.3–1.2)
Total Protein: 6.7 g/dL (ref 6.5–8.1)

## 2018-02-05 LAB — CBC WITH DIFFERENTIAL/PLATELET
BASOS ABS: 0 10*3/uL (ref 0–0.1)
Basophils Relative: 1 %
Eosinophils Absolute: 0 10*3/uL (ref 0–0.7)
Eosinophils Relative: 0 %
HEMATOCRIT: 28.6 % — AB (ref 35.0–47.0)
Hemoglobin: 9.3 g/dL — ABNORMAL LOW (ref 12.0–16.0)
LYMPHS PCT: 4 %
Lymphs Abs: 0.3 10*3/uL — ABNORMAL LOW (ref 1.0–3.6)
MCH: 27.6 pg (ref 26.0–34.0)
MCHC: 32.7 g/dL (ref 32.0–36.0)
MCV: 84.5 fL (ref 80.0–100.0)
MONO ABS: 0.4 10*3/uL (ref 0.2–0.9)
Monocytes Relative: 7 %
NEUTROS ABS: 5 10*3/uL (ref 1.4–6.5)
Neutrophils Relative %: 88 %
Platelets: 185 10*3/uL (ref 150–440)
RBC: 3.38 MIL/uL — ABNORMAL LOW (ref 3.80–5.20)
RDW: 16.3 % — AB (ref 11.5–14.5)
WBC: 5.7 10*3/uL (ref 3.6–11.0)

## 2018-02-05 LAB — INFLUENZA PANEL BY PCR (TYPE A & B)
Influenza A By PCR: POSITIVE — AB
Influenza B By PCR: NEGATIVE

## 2018-02-05 LAB — PROTIME-INR
INR: 0.92
Prothrombin Time: 12.3 seconds (ref 11.4–15.2)

## 2018-02-05 LAB — LIPASE, BLOOD: Lipase: 20 U/L (ref 11–51)

## 2018-02-05 LAB — MRSA PCR SCREENING: MRSA by PCR: NEGATIVE

## 2018-02-05 LAB — LACTIC ACID, PLASMA
LACTIC ACID, VENOUS: 0.9 mmol/L (ref 0.5–1.9)
Lactic Acid, Venous: 1 mmol/L (ref 0.5–1.9)

## 2018-02-05 LAB — TROPONIN I: Troponin I: <0.03 ng/mL (ref <0.03)

## 2018-02-05 MED ORDER — TIOTROPIUM BROMIDE MONOHYDRATE 18 MCG IN CAPS
18.0000 ug | ORAL_CAPSULE | Freq: Every day | RESPIRATORY_TRACT | Status: DC
  Administered 2018-02-06 – 2018-02-07 (×2): 18 ug via RESPIRATORY_TRACT
  Filled 2018-02-05: qty 5

## 2018-02-05 MED ORDER — ACETAMINOPHEN 650 MG RE SUPP
650.0000 mg | Freq: Once | RECTAL | Status: AC
  Administered 2018-02-05: 650 mg via RECTAL
  Filled 2018-02-05: qty 1

## 2018-02-05 MED ORDER — DIPHENOXYLATE-ATROPINE 2.5-0.025 MG PO TABS: 2.0000 | ORAL_TABLET | Freq: Four times a day (QID) | ORAL | Status: DC | PRN

## 2018-02-05 MED ORDER — POLYETHYLENE GLYCOL 3350 17 G PO PACK: 17.0000 g | PACK | Freq: Every day | ORAL | Status: DC | PRN

## 2018-02-05 MED ORDER — VANCOMYCIN HCL IN DEXTROSE 1-5 GM/200ML-% IV SOLN
1000.0000 mg | Freq: Once | INTRAVENOUS | Status: DC
  Administered 2018-02-05: 1000 mg via INTRAVENOUS
  Filled 2018-02-05: qty 200

## 2018-02-05 MED ORDER — SODIUM CHLORIDE 0.9 % IV BOLUS (SEPSIS)
30.0000 mL/kg | Freq: Once | INTRAVENOUS | Status: AC
  Administered 2018-02-05: 1000 mL via INTRAVENOUS

## 2018-02-05 MED ORDER — MOMETASONE FURO-FORMOTEROL FUM 200-5 MCG/ACT IN AERO
2.0000 | INHALATION_SPRAY | Freq: Two times a day (BID) | RESPIRATORY_TRACT | Status: DC
  Administered 2018-02-06 – 2018-02-07 (×3): 2 via RESPIRATORY_TRACT
  Filled 2018-02-05 (×2): qty 8.8

## 2018-02-05 MED ORDER — VANCOMYCIN HCL IN DEXTROSE 750-5 MG/150ML-% IV SOLN
750.0000 mg | INTRAVENOUS | Status: DC
Start: 2018-02-06 — End: 2018-02-05
  Filled 2018-02-05: qty 150

## 2018-02-05 MED ORDER — CELECOXIB 100 MG PO CAPS
100.0000 mg | ORAL_CAPSULE | Freq: Two times a day (BID) | ORAL | Status: DC | PRN
  Administered 2018-02-06: 100 mg via ORAL
  Filled 2018-02-05 (×2): qty 1

## 2018-02-05 MED ORDER — ENOXAPARIN SODIUM 40 MG/0.4ML ~~LOC~~ SOLN
40.0000 mg | SUBCUTANEOUS | Status: DC
  Administered 2018-02-05: 40 mg via SUBCUTANEOUS
  Filled 2018-02-05: qty 0.4

## 2018-02-05 MED ORDER — PREGABALIN 75 MG PO CAPS: 300.0000 mg | ORAL_CAPSULE | Freq: Two times a day (BID) | ORAL | Status: DC

## 2018-02-05 MED ORDER — PANTOPRAZOLE SODIUM 40 MG PO TBEC
40.0000 mg | DELAYED_RELEASE_TABLET | Freq: Two times a day (BID) | ORAL | Status: DC
  Administered 2018-02-05 – 2018-02-07 (×4): 40 mg via ORAL
  Filled 2018-02-05 (×4): qty 1

## 2018-02-05 MED ORDER — ACETAMINOPHEN 650 MG RE SUPP: 650.0000 mg | Freq: Four times a day (QID) | RECTAL | Status: DC | PRN

## 2018-02-05 MED ORDER — DONEPEZIL HCL 5 MG PO TABS
10.0000 mg | ORAL_TABLET | Freq: Every day | ORAL | Status: DC
  Administered 2018-02-05 – 2018-02-06 (×2): 10 mg via ORAL
  Filled 2018-02-05 (×3): qty 1

## 2018-02-05 MED ORDER — METHYLPREDNISOLONE SODIUM SUCC 125 MG IJ SOLR
60.0000 mg | INTRAMUSCULAR | Status: DC
  Administered 2018-02-05 – 2018-02-06 (×2): 60 mg via INTRAVENOUS
  Filled 2018-02-05 (×2): qty 2

## 2018-02-05 MED ORDER — ALPRAZOLAM 0.25 MG PO TABS: 0.2500 mg | ORAL_TABLET | Freq: Three times a day (TID) | ORAL | Status: DC | PRN

## 2018-02-05 MED ORDER — ONDANSETRON HCL 4 MG PO TABS: 4.0000 mg | ORAL_TABLET | Freq: Four times a day (QID) | ORAL | Status: DC | PRN

## 2018-02-05 MED ORDER — CYCLOBENZAPRINE HCL 10 MG PO TABS: 5.0000 mg | ORAL_TABLET | Freq: Three times a day (TID) | ORAL | Status: DC | PRN

## 2018-02-05 MED ORDER — LEVOFLOXACIN IN D5W 500 MG/100ML IV SOLN
500.0000 mg | INTRAVENOUS | Status: DC
  Administered 2018-02-05: 500 mg via INTRAVENOUS
  Filled 2018-02-05: qty 100

## 2018-02-05 MED ORDER — ESTROPIPATE 1.5 MG PO TABS
0.7500 mg | ORAL_TABLET | ORAL | Status: DC
  Administered 2018-02-07: 0.75 mg via ORAL
  Filled 2018-02-05: qty 0.5

## 2018-02-05 MED ORDER — ACETAMINOPHEN 325 MG PO TABS
650.0000 mg | ORAL_TABLET | Freq: Four times a day (QID) | ORAL | Status: DC | PRN
Start: 2018-02-05 — End: 2018-02-07

## 2018-02-05 MED ORDER — ESTROPIPATE 0.75 MG PO TABS: 0.7500 mg | ORAL_TABLET | ORAL | Status: DC

## 2018-02-05 MED ORDER — IPRATROPIUM-ALBUTEROL 0.5-2.5 (3) MG/3ML IN SOLN
3.0000 mL | Freq: Four times a day (QID) | RESPIRATORY_TRACT | Status: DC
  Administered 2018-02-06 – 2018-02-07 (×6): 3 mL via RESPIRATORY_TRACT
  Filled 2018-02-05 (×7): qty 3

## 2018-02-05 MED ORDER — PIPERACILLIN-TAZOBACTAM 3.375 G IVPB 30 MIN
3.3750 g | Freq: Once | INTRAVENOUS | Status: AC
  Administered 2018-02-05: 3.375 g via INTRAVENOUS
  Filled 2018-02-05: qty 50

## 2018-02-05 MED ORDER — ONDANSETRON HCL 4 MG/2ML IJ SOLN: 4.0000 mg | Freq: Four times a day (QID) | INTRAMUSCULAR | Status: DC | PRN

## 2018-02-05 MED ORDER — ALBUTEROL SULFATE (2.5 MG/3ML) 0.083% IN NEBU: 2.5000 mg | INHALATION_SOLUTION | RESPIRATORY_TRACT | Status: DC | PRN

## 2018-02-05 MED ORDER — CLOPIDOGREL BISULFATE 75 MG PO TABS
75.0000 mg | ORAL_TABLET | Freq: Every day | ORAL | Status: DC
  Administered 2018-02-06 – 2018-02-07 (×2): 75 mg via ORAL
  Filled 2018-02-05 (×2): qty 1

## 2018-02-05 MED ORDER — SIMVASTATIN 10 MG PO TABS
10.0000 mg | ORAL_TABLET | Freq: Every day | ORAL | Status: DC
  Administered 2018-02-05 – 2018-02-06 (×2): 10 mg via ORAL
  Filled 2018-02-05 (×3): qty 1

## 2018-02-05 NOTE — ED Notes (Signed)
Patient transported to 224 

## 2018-02-05 NOTE — ED Provider Notes (Signed)
[facility] Emergency Department Provider Note  ____________________________________________   First MD Initiated Contact with Patient 02/05/18 1850     (approximate)  I have reviewed the triage vital signs and the nursing notes.   HISTORY  Chief Complaint Code Sepsis  Level 5 exemption history limited by the patient's clinical condition  HPI Cheryl [name] is a 67 y.o. female who was brought to the emergency department today by family for fever to 103 degrees as well as decreased level of consciousness cough and shortness of breath.  Apparently the patient was in her usual state of health yesterday however throughout the course of today has been battling a fever and has been increasingly confused.  Further history is challenging to obtain.  Past Medical History:  Diagnosis Date  . Allergy   . Anxiety   . Arthritis   . Aspiration pneumonitis ([place]) 11/24/2015  . Asthma   . Chronic pain   . COPD (chronic obstructive pulmonary disease) ([place])   . Coronary artery disease   . DVT (deep venous thrombosis) ([place])   . GERD (gastroesophageal reflux disease)   . Headache   . Hyperlipidemia   . Hypertension   . Kyphoscoliosis deformity of spine   . Migraines   . Neuropathy 2010  . Osteoporosis   . Oxygen deficiency   . Peripheral vascular disease ([place])   . Pneumonia   . Pneumonia 11/19/2015  . Pneumonia 10/2016  . Pneumonia    aspiration 2018- 4 times in last year  . Vitamin D deficiency     Patient Active Problem List   Diagnosis Date Noted  . Rib fracture 09/19/2017  . Marijuana use 06/05/2017  . Pneumonia 04/29/2017  . Acute postoperative pain 04/14/2017  . Metabolic encephalopathy 36/04/7702  . Hypokalemia 12/23/2016  . Protein-calorie malnutrition, severe 11/06/2016  . Nausea with vomiting   . Lumbar facet syndrome (Location of Primary Source of Pain) (Bilateral) (L>R) 03/20/2016  . Lumbar spondylosis 03/20/2016  . Opiate use (160  MME/Day) 02/28/2016  . Anemia 02/28/2016  . Osteoarthrosis 12/18/2015  . Long term current use of anticoagulant therapy (Plavix) 12/18/2015  . Long term current use of opiate analgesic 12/04/2015  . Fibromyalgia 12/04/2015  . Dysphagia 11/24/2015  . Acute diastolic CHF (congestive heart failure) ([place]) 11/24/2015  . Leukocytosis 11/24/2015  . Abnormal mammogram of right breast 10/11/2015  . Dilated intrahepatic bile duct 10/11/2015  . Lumbar radicular pain (Bilateral) (L>R) (L4) 09/04/2015  . Chronic low back pain (Location of Primary Source of Pain) (Bilateral) (L>R) 09/04/2015  . Encounter for therapeutic drug level monitoring 09/04/2015  . Uncomplicated opioid dependence ([place]) 09/04/2015  . Chronic pain syndrome 09/04/2015  . Platelet inhibition due to Plavix 09/04/2015  . COPD (chronic obstructive pulmonary disease) ([place]) 07/31/2015  . Dementia 07/31/2015  . Airway hyperreactivity 07/03/2015  . Back pain, thoracic 07/03/2015  . Excessive falling 07/03/2015  . Alteration in bowel elimination: incontinence 07/03/2015  . Insomnia 07/03/2015  . Decreased testosterone level 07/03/2015  . Leg weakness 07/03/2015  . Menopausal symptom 07/03/2015  . Migraine 07/03/2015  . Neuropathy ([place]) 07/03/2015  . Fecal occult blood test positive 07/03/2015  . OP (osteoporosis) 07/03/2015  . Panic disorder 07/03/2015  . Compulsive tobacco user syndrome 07/03/2015  . Urinary incontinence 07/03/2015  . Weight loss 07/03/2015  . Essential hypertension 06/06/2015  . GERD (gastroesophageal reflux disease) 06/06/2015  . Hyperlipemia 06/06/2015  . Peripheral nerve disease 04/13/2014  . Anxiety 02/10/2014  . Coronary artery disease 02/10/2014  .  Peripheral vascular disease ([place]) 02/10/2014  . Vitamin D deficiency 10/16/2009    Past Surgical History:  Procedure Laterality Date  . ABDOMINAL HYSTERECTOMY  1975   Bilaterl Oophorectomy; [place] to IUD infection  . abdomnal aortic stent  05/30/2008    Dr. Quay Burow  . APPENDECTOMY    . APPENDECTOMY    . cardiac catherization  10/31/2009  . CERVICAL FUSION  C5 - 6/C6-7  . CHOLECYSTECTOMY  1972  . COLONOSCOPY WITH PROPOFOL N/A 07/27/2015   Procedure: COLONOSCOPY WITH PROPOFOL;  Surgeon: Hulen Luster, MD;  Location: [facility] ENDOSCOPY;  Service: Gastroenterology;  Laterality: N/A;  . ESOPHAGOGASTRODUODENOSCOPY (EGD) WITH PROPOFOL N/A 07/27/2015   Procedure: ESOPHAGOGASTRODUODENOSCOPY (EGD) WITH PROPOFOL;  Surgeon: Hulen Luster, MD;  Location: [facility] ENDOSCOPY;  Service: Gastroenterology;  Laterality: N/A;  . FOOT SURGERY Bilateral    5-6 years per patient  . SPINE SURGERY      Prior to Admission medications   Medication Sig Start Date End Date Taking? Authorizing Provider  alendronate (FOSAMAX) 70 MG tablet Take 1 tablet (70 mg total) by mouth every 7 (seven) days. Take with a full glass of water on an empty stomach. 01/23/18  Yes Birdie Sons, MD  clopidogrel (PLAVIX) 75 MG tablet Take 75 mg by mouth daily.   Yes [provider]  diphenoxylate-atropine (LOMOTIL) 2.5-0.025 MG per tablet Take 2 tablets by mouth 4 (four) times daily as needed for diarrhea or loose stools. Reported on 03/11/2016   Yes [provider]  donepezil (ARICEPT) 10 MG tablet Take 10 mg by mouth at bedtime.   Yes [provider]  estropipate (OGEN) 0.75 MG tablet Take 1 tablet (0.75 mg total) by mouth every other day. 01/23/18  Yes Birdie Sons, MD  fentaNYL (DURAGESIC - DOSED MCG/HR) 50 MCG/HR Place 1 patch (50 mcg total) onto the skin every 3 (three) days. 04/22/18 05/22/18 Yes Vevelyn Francois, NP  furosemide (LASIX) 40 MG tablet Take 40 mg by mouth every other day.  02/16/17  Yes [provider]  LYRICA 300 MG capsule Take 300 mg by mouth 2 (two) times daily. 04/12/17  Yes [provider]  pantoprazole (PROTONIX) 40 MG tablet Take 1 tablet (40 mg total) by mouth 2 (two) times daily. 01/23/18  Yes Birdie Sons, MD  simvastatin  (ZOCOR) 10 MG tablet Take 10 mg by mouth at bedtime. Reported on 12/04/2015   Yes [provider]  traMADol (ULTRAM) 50 MG tablet Take 2 tablets (100 mg total) by mouth every 6 (six) hours as needed for moderate pain or severe pain. 02/21/18 05/22/18 Yes Vevelyn Francois, NP  acetaminophen (TYLENOL) 325 MG tablet Take 2 tablets (650 mg total) by mouth every 6 (six) hours as needed for mild pain (or Fever >/= 101). 12/25/16   Gladstone Lighter, MD  albuterol (PROVENTIL HFA;VENTOLIN HFA) 108 (90 Base) MCG/ACT inhaler Inhale 2 puffs into the lungs every 4 (four) hours as needed for wheezing or shortness of breath. 01/23/18   Birdie Sons, MD  albuterol (PROVENTIL) (2.5 MG/3ML) 0.083% nebulizer solution Take 3 mLs (2.5 mg total) by nebulization every 4 (four) hours as needed for wheezing. 12/24/17   Birdie Sons, MD  ALPRAZolam Duanne Moron) 0.5 MG tablet take 1 tablet by mouth three times a day if needed 01/23/18   Birdie Sons, MD  benzonatate (TESSALON) 100 MG capsule Take 1 capsule (100 mg total) by mouth 2 (two) times daily as needed for cough. Patient not taking:  Reported on 02/05/2018 11/24/17   Birdie Sons, MD  celecoxib (CELEBREX) 100 MG capsule Take 1 capsule (100 mg total) by mouth 2 (two) times daily as needed. 01/23/18   Birdie Sons, MD  cyclobenzaprine (FLEXERIL) 5 MG tablet Take 1 tablet (5 mg total) by mouth 3 (three) times daily as needed for muscle spasms. 02/02/18   Birdie Sons, MD  fentaNYL (DURAGESIC - DOSED MCG/HR) 50 MCG/HR Place 1 patch (50 mcg total) onto the skin every 3 (three) days. Patient not taking: Reported on 02/05/2018 03/23/18 04/22/18  Vevelyn Francois, NP  fentaNYL (DURAGESIC - DOSED MCG/HR) 50 MCG/HR Place 1 patch (50 mcg total) onto the skin every 3 (three) days. Patient not taking: Reported on 02/05/2018 02/21/18 03/23/18  Vevelyn Francois, NP  fentaNYL (DURAGESIC - DOSED MCG/HR) 50 MCG/HR Place 1 patch (50 mcg total) onto the skin every 3 (three)  days. Patient not taking: Reported on 02/05/2018 01/22/18 02/21/18  Vevelyn Francois, NP  Fluticasone-Salmeterol (ADVAIR DISKUS) 250-50 MCG/DOSE AEPB inhale 1 dose by mouth twice a day 01/23/18   Birdie Sons, MD  mupirocin ointment (BACTROBAN) 2 % Place 1 application into the nose 2 (two) times daily. 06/03/17   Birdie Sons, MD  NARCAN 4 MG/0.1ML LIQD nasal spray kit Place 0.4 mg into the nose once.  09/11/17   [provider]  silver sulfADIAZINE (SILVADENE) 1 % cream Apply topically daily. 01/26/18   Birdie Sons, MD  SUMAtriptan (IMITREX) 25 MG tablet Take 1 tablet (25 mg total) by mouth as needed for migraine. May repeat in 2 hours if headache persists or recurs. 12/23/17   Birdie Sons, MD  tiotropium (SPIRIVA HANDIHALER) 18 MCG inhalation capsule inhale the contents of one capsule in the handihaler once daily 01/23/18   Caryn Section, Kirstie Peri, MD  traMADol (ULTRAM) 50 MG tablet Take 2 tablets (100 mg total) by mouth every 6 (six) hours as needed for moderate pain or severe pain. 01/22/18 02/21/18  Vevelyn Francois, NP  triamcinolone ointment (KENALOG) 0.5 % Apply to lesions on hands twice a day as needed 10/24/16   Birdie Sons, MD    Allergies Percocet [oxycodone-acetaminophen]; Aspirin; Codeine; Propoxyphene; and Sulfa antibiotics  Family History  Problem Relation Age of Onset  . Cancer Mother   . Arthritis Mother   . Heart disease Mother   . Diabetes Mother        mellitus, type 2  . Heart disease Father   . Diabetes Sister   . Cancer Brother   . Cancer Brother        lung  . Diabetes Brother     Social History Social History   Tobacco Use  . Smoking status: Current Every Day Smoker    Packs/day: 0.50    Years: 50.00    Pack years: 25.00    Types: Cigarettes  . Smokeless tobacco: Never Used  . Tobacco comment: Previously smoked 2 ppd  Substance Use Topics  . Alcohol use: No    Alcohol/week: 0.0 oz  . Drug use: No    Review of Systems Level 5  exemption history limited by the patient's clinical condition ____________________________________________   PHYSICAL EXAM:  VITAL SIGNS: ED Triage Vitals  Enc Vitals Group     BP      Pulse      Resp      Temp      Temp src      SpO2  Weight      Height      Head Circumference      Peak Flow      Pain Score      Pain Loc      Pain Edu?      Excl. in [place]?     Constitutional: Appears short of breath in moderate distress Eyes: PERRL EOMI. Head: Atraumatic. Nose: No congestion/rhinnorhea. Mouth/Throat: No trismus Neck: No stridor.   Cardiovascular: Tachycardic rate, regular rhythm. Grossly normal heart sounds.  Good peripheral circulation. Respiratory: Increased respiratory effort with crackles throughout Gastrointestinal: Cachectic Musculoskeletal: No lower extremity edema   Neurologic:   No gross focal neurologic deficits are appreciated. Skin:  Skin is warm, dry and intact. No rash noted.     ____________________________________________   DIFFERENTIAL includes but not limited to  Pneumonia, sepsis, influenza, pulmonary embolism ____________________________________________   LABS (all labs ordered are listed, but only abnormal results are displayed)  Labs Reviewed  COMPREHENSIVE METABOLIC PANEL - Abnormal; Notable for the following components:      Result Value   Chloride 99 (*)    Calcium 8.5 (*)    ALT 9 (*)    All other components within normal limits  CBC WITH DIFFERENTIAL/PLATELET - Abnormal; Notable for the following components:   RBC 3.38 (*)    Hemoglobin 9.3 (*)    HCT 28.6 (*)    RDW 16.3 (*)    Lymphs Abs 0.3 (*)    All other components within normal limits  URINALYSIS, ROUTINE W REFLEX MICROSCOPIC - Abnormal; Notable for the following components:   Color, Urine YELLOW (*)    APPearance CLEAR (*)    Hgb urine dipstick SMALL (*)    Ketones, ur 20 (*)    Squamous Epithelial / LPF 0-5 (*)    All other components within normal limits   INFLUENZA PANEL BY PCR (TYPE A & B) - Abnormal; Notable for the following components:   Influenza A By PCR POSITIVE (*)    All other components within normal limits  BASIC METABOLIC PANEL - Abnormal; Notable for the following components:   Glucose, Bld 101 (*)    Calcium 8.2 (*)    All other components within normal limits  CBC - Abnormal; Notable for the following components:   RBC 3.45 (*)    Hemoglobin 9.4 (*)    HCT 29.2 (*)    RDW 16.3 (*)    All other components within normal limits  CULTURE, BLOOD (ROUTINE X 2)  CULTURE, BLOOD (ROUTINE X 2)  MRSA PCR SCREENING  URINE CULTURE  LACTIC ACID, PLASMA  LACTIC ACID, PLASMA  LIPASE, BLOOD  TROPONIN I  PROCALCITONIN  PROTIME-INR    Lab work reviewed by me with a number of abnormalities including influenza positive and low white count concerning for sepsis __________________________________________  EKG  ED ECG REPORT I, Darel Hong, the attending physician, personally viewed and interpreted this ECG.  Date: 02/05/2018 EKG Time:  Rate: 71 Rhythm: normal sinus rhythm QRS Axis: normal Intervals: normal ST/T Wave abnormalities: Lateral and septal T wave inversion and ST depression consistent with previous EKG 04/29/2017 Narrative Interpretation: no evidence of acute ischemia  ____________________________________________  RADIOLOGY  Chest x-ray reviewed by me concerning for lobar pneumonia ____________________________________________   PROCEDURES  Procedure(s) performed: yes  Angiocath insertion Performed by: Darel Hong  Consent: Verbal consent obtained. Risks and benefits: risks, benefits and alternatives were discussed Time out: Immediately prior to procedure a "time out" was called to verify  the correct patient, procedure, equipment, support staff and site/side marked as required.  Preparation: Patient was prepped and draped in the usual sterile fashion.  Vein Location: Right antecubital  fossa  Ultrasound Guided  Gauge: 20  Normal blood return and flush without difficulty Patient tolerance: Patient tolerated the procedure well with no immediate complications.    Angiocath insertion Performed by: Darel Hong  Consent: Verbal consent obtained. Risks and benefits: risks, benefits and alternatives were discussed Time out: Immediately prior to procedure a "time out" was called to verify the correct patient, procedure, equipment, support staff and site/side marked as required.  Preparation: Patient was prepped and draped in the usual sterile fashion.  Vein Location: Left antecubital fossa  Ultrasound Guided  Gauge: 20  Normal blood return and flush without difficulty Patient tolerance: Patient tolerated the procedure well with no immediate complications.     .Critical Care Performed by: Darel Hong, MD Authorized by: Darel Hong, MD   Critical care provider statement:    Critical care time (minutes):  35   Critical care time was exclusive of:  Separately billable procedures and treating other patients   Critical care was necessary to treat or prevent imminent or life-threatening deterioration of the following conditions:  Sepsis and respiratory failure   Critical care was time spent personally by me on the following activities:  Development of treatment plan with patient or surrogate, discussions with consultants, evaluation of patient's response to treatment, examination of patient, obtaining history from patient or surrogate, ordering and performing treatments and interventions, ordering and review of laboratory studies, ordering and review of radiographic studies, pulse oximetry, re-evaluation of patient's condition and review of old charts     Critical Care performed: Yes  Observation: no ____________________________________________   INITIAL IMPRESSION / ASSESSMENT AND PLAN / ED COURSE  Pertinent labs & imaging results that were  available during my care of the patient were reviewed by me and considered in my medical decision making (see chart for details).  The patient come straight back from triage hypoxic, tachycardic, and warm to the touch.  Quick rectal temperature shows 102.1 degrees concerning for pulmonary sepsis.  I established to IVs with ultrasound and we will bolus the patient as well as checking broad labs and broad-spectrum antibiotics.    At this point the patient requires inpatient admission for continued respiratory support and intravenous antibiotics.  The patient verbalizes understanding and agreement with the plan.  I then discussed with the hospitalist who is graciously agreed to admit the patient to his service. ____________________________________________   FINAL CLINICAL IMPRESSION(S) / ED DIAGNOSES  Final diagnoses:  Sepsis, due to unspecified organism [facility])  Community acquired pneumonia of left lower lobe of lung ([place])      NEW MEDICATIONS STARTED DURING THIS VISIT:  Current Discharge Medication List       Note:  This document was prepared using Dragon voice recognition software and may include unintentional dictation errors.     Darel Hong, MD 02/07/18 [phone]

## 2018-02-05 NOTE — H&P (Signed)
[place] [facility] [place] NAME: Cheryl [name]    MR#:  [id number]  DATE OF BIRTH:  [birth year]  DATE OF ADMISSION:  02/05/2018  PRIMARY CARE PHYSICIAN: Birdie Sons, MD   REQUESTING/REFERRING PHYSICIAN: Dr. Mable Paris  CHIEF COMPLAINT:   Chief Complaint  Patient presents with  . Code Sepsis    HISTORY OF PRESENT ILLNESS:  Cheryl [name]  is a 67 y.o. female with a known history of COPD, tobacco use, hypertension, chronic pain syndrome on fentanyl patch, oxygen use as needed at home presents to the emergency room brought in by family due to lethargy, wheezing and poor oral intake.  Patient here has been found to have left-sided pneumonia along with sepsis.  Started on sepsis protocol, IV antibiotics.  Culture sent.  Lactic acid check.  Influenza pending.  Patient is unable to contribute to history but wakes up on calling her name.  Husband gave most of the history.  Old records reviewed.  Patient's fentanyl patch was removed by family due to being drowsy.  Patient does not seem to be in any pain.  PAST MEDICAL HISTORY:   Past Medical History:  Diagnosis Date  . Allergy   . Anxiety   . Arthritis   . Aspiration pneumonitis ([place]) 11/24/2015  . Asthma   . Chronic pain   . COPD (chronic obstructive pulmonary disease) ([place])   . Coronary artery disease   . DVT (deep venous thrombosis) ([place])   . GERD (gastroesophageal reflux disease)   . Headache   . Hyperlipidemia   . Hypertension   . Kyphoscoliosis deformity of spine   . Migraines   . Neuropathy 2010  . Osteoporosis   . Oxygen deficiency   . Peripheral vascular disease ([place])   . Pneumonia   . Pneumonia 11/19/2015  . Pneumonia 10/2016  . Pneumonia    aspiration 2018- 4 times in last year  . Vitamin D deficiency     PAST SURGICAL HISTORY:   Past Surgical History:  Procedure Laterality Date  . ABDOMINAL HYSTERECTOMY  1975   Bilaterl Oophorectomy; [place] to IUD infection  . abdomnal aortic  stent  05/30/2008   Dr. Quay Burow  . APPENDECTOMY    . APPENDECTOMY    . cardiac catherization  10/31/2009  . CERVICAL FUSION  C5 - 6/C6-7  . CHOLECYSTECTOMY  1972  . COLONOSCOPY WITH PROPOFOL N/A 07/27/2015   Procedure: COLONOSCOPY WITH PROPOFOL;  Surgeon: Hulen Luster, MD;  Location: [facility] ENDOSCOPY;  Service: Gastroenterology;  Laterality: N/A;  . ESOPHAGOGASTRODUODENOSCOPY (EGD) WITH PROPOFOL N/A 07/27/2015   Procedure: ESOPHAGOGASTRODUODENOSCOPY (EGD) WITH PROPOFOL;  Surgeon: Hulen Luster, MD;  Location[facility] ENDOSCOPY;  Service: Gastroenterology;  Laterality: N/A;  . FOOT SURGERY Bilateral    5-6 years per patient  . SPINE SURGERY      SOCIAL HISTORY:   Social History   Tobacco Use  . Smoking status: Current Every Day Smoker    Packs/day: 0.50    Years: 50.00    Pack years: 25.00    Types: Cigarettes  . Smokeless tobacco: Never Used  . Tobacco comment: Previously smoked 2 ppd  Substance Use Topics  . Alcohol use: No    Alcohol/week: 0.0 oz    FAMILY HISTORY:   Family History  Problem Relation Age of Onset  . Cancer Mother   . Arthritis Mother   . Heart disease Mother   . Diabetes Mother        mellitus, type  2  . Heart disease Father   . Diabetes Sister   . Cancer Brother   . Cancer Brother        lung  . Diabetes Brother     DRUG ALLERGIES:   Allergies  Allergen Reactions  . Percocet [Oxycodone-Acetaminophen] Hives and Rash  . Aspirin Nausea And Vomiting and Other (See Comments)    Reaction:  GI upset   . Codeine Nausea And Vomiting, Other (See Comments) and Nausea Only    Reaction:  GI upset   . Propoxyphene Other (See Comments) and Nausea Only    GI upset Reaction:  GI upset   . Sulfa Antibiotics Rash and Other (See Comments)    Reaction:  GI upset     REVIEW OF SYSTEMS:   Review of Systems  Unable to perform ROS: Mental status change    MEDICATIONS AT HOME:   Prior to Admission medications   Medication Sig Start Date End Date Taking?  Authorizing Provider  alendronate (FOSAMAX) 70 MG tablet Take 1 tablet (70 mg total) by mouth every 7 (seven) days. Take with a full glass of water on an empty stomach. 01/23/18  Yes Birdie Sons, MD  clopidogrel (PLAVIX) 75 MG tablet Take 75 mg by mouth daily.   Yes [provider]  diphenoxylate-atropine (LOMOTIL) 2.5-0.025 MG per tablet Take 2 tablets by mouth 4 (four) times daily as needed for diarrhea or loose stools. Reported on 03/11/2016   Yes [provider]  donepezil (ARICEPT) 10 MG tablet Take 10 mg by mouth at bedtime.   Yes [provider]  estropipate (OGEN) 0.75 MG tablet Take 1 tablet (0.75 mg total) by mouth every other day. 01/23/18  Yes Birdie Sons, MD  fentaNYL (DURAGESIC - DOSED MCG/HR) 50 MCG/HR Place 1 patch (50 mcg total) onto the skin every 3 (three) days. 04/22/18 05/22/18 Yes Vevelyn Francois, NP  furosemide (LASIX) 40 MG tablet Take 40 mg by mouth every other day.  02/16/17  Yes [provider]  LYRICA 300 MG capsule Take 300 mg by mouth 2 (two) times daily. 04/12/17  Yes [provider]  pantoprazole (PROTONIX) 40 MG tablet Take 1 tablet (40 mg total) by mouth 2 (two) times daily. 01/23/18  Yes Birdie Sons, MD  simvastatin (ZOCOR) 10 MG tablet Take 10 mg by mouth at bedtime. Reported on 12/04/2015   Yes [provider]  traMADol (ULTRAM) 50 MG tablet Take 2 tablets (100 mg total) by mouth every 6 (six) hours as needed for moderate pain or severe pain. 02/21/18 05/22/18 Yes Vevelyn Francois, NP  acetaminophen (TYLENOL) 325 MG tablet Take 2 tablets (650 mg total) by mouth every 6 (six) hours as needed for mild pain (or Fever >/= 101). 12/25/16   Gladstone Lighter, MD  albuterol (PROVENTIL HFA;VENTOLIN HFA) 108 (90 Base) MCG/ACT inhaler Inhale 2 puffs into the lungs every 4 (four) hours as needed for wheezing or shortness of breath. 01/23/18   Birdie Sons, MD  albuterol (PROVENTIL) (2.5 MG/3ML) 0.083% nebulizer solution  Take 3 mLs (2.5 mg total) by nebulization every 4 (four) hours as needed for wheezing. 12/24/17   Birdie Sons, MD  ALPRAZolam Duanne Moron) 0.5 MG tablet take 1 tablet by mouth three times a day if needed 01/23/18   Birdie Sons, MD  benzonatate (TESSALON) 100 MG capsule Take 1 capsule (100 mg total) by mouth 2 (two) times daily as needed for cough. Patient not taking: Reported on 02/05/2018 11/24/17  Birdie Sons, MD  celecoxib (CELEBREX) 100 MG capsule Take 1 capsule (100 mg total) by mouth 2 (two) times daily as needed. 01/23/18   Birdie Sons, MD  cyclobenzaprine (FLEXERIL) 5 MG tablet Take 1 tablet (5 mg total) by mouth 3 (three) times daily as needed for muscle spasms. 02/02/18   Birdie Sons, MD  fentaNYL (DURAGESIC - DOSED MCG/HR) 50 MCG/HR Place 1 patch (50 mcg total) onto the skin every 3 (three) days. Patient not taking: Reported on 02/05/2018 03/23/18 04/22/18  Vevelyn Francois, NP  fentaNYL (DURAGESIC - DOSED MCG/HR) 50 MCG/HR Place 1 patch (50 mcg total) onto the skin every 3 (three) days. Patient not taking: Reported on 02/05/2018 02/21/18 03/23/18  Vevelyn Francois, NP  fentaNYL (DURAGESIC - DOSED MCG/HR) 50 MCG/HR Place 1 patch (50 mcg total) onto the skin every 3 (three) days. Patient not taking: Reported on 02/05/2018 01/22/18 02/21/18  Vevelyn Francois, NP  Fluticasone-Salmeterol (ADVAIR DISKUS) 250-50 MCG/DOSE AEPB inhale 1 dose by mouth twice a day 01/23/18   Birdie Sons, MD  mupirocin ointment (BACTROBAN) 2 % Place 1 application into the nose 2 (two) times daily. 06/03/17   Birdie Sons, MD  NARCAN 4 MG/0.1ML LIQD nasal spray kit Place 0.4 mg into the nose once.  09/11/17   [provider]  silver sulfADIAZINE (SILVADENE) 1 % cream Apply topically daily. 01/26/18   Birdie Sons, MD  SUMAtriptan (IMITREX) 25 MG tablet Take 1 tablet (25 mg total) by mouth as needed for migraine. May repeat in 2 hours if headache persists or recurs. 12/23/17   Birdie Sons, MD   tiotropium (SPIRIVA HANDIHALER) 18 MCG inhalation capsule inhale the contents of one capsule in the handihaler once daily 01/23/18   Caryn Section, Kirstie Peri, MD  traMADol (ULTRAM) 50 MG tablet Take 2 tablets (100 mg total) by mouth every 6 (six) hours as needed for moderate pain or severe pain. 01/22/18 02/21/18  Vevelyn Francois, NP  triamcinolone ointment (KENALOG) 0.5 % Apply to lesions on hands twice a day as needed 10/24/16   Birdie Sons, MD     VITAL SIGNS:  Blood pressure (!) 146/72, pulse 76, temperature (!) 102.1 F (38.9 C), temperature source Rectal, height 4' 10" (1.473 m), weight 44.5 kg (98 lb), SpO2 100 %.  PHYSICAL EXAMINATION:  Physical Exam  GENERAL:  67 y.o.-year-old patient lying in the bed with no acute distress.  Thin and frail EYES: Pupils equal, round, reactive to light and accommodation. No scleral icterus. Extraocular muscles intact.  HEENT: Head atraumatic, normocephalic. Oropharynx and nasopharynx clear. No oropharyngeal erythema, moist oral mucosa  NECK:  Supple, no jugular venous distention. No thyroid enlargement, no tenderness.  LUNGS: Bilateral wheezing and decreased air entry CARDIOVASCULAR: S1, S2 normal. No murmurs, rubs, or gallops.  ABDOMEN: Soft, nontender, nondistended. Bowel sounds present. No organomegaly or mass.  EXTREMITIES: No pedal edema, cyanosis, or clubbing. + 2 pedal & radial pulses b/l.   NEUROLOGIC: Cranial nerves II through XII are intact. No focal Motor or sensory deficits appreciated b/l PSYCHIATRIC: The patient is drowsy SKIN: No obvious rash, lesion, or ulcer.   LABORATORY PANEL:   CBC Recent Labs  Lab 02/05/18 1854  WBC 5.7  HGB 9.3*  HCT 28.6*  PLT 185   ------------------------------------------------------------------------------------------------------------------  Chemistries  Recent Labs  Lab 02/05/18 1854  NA 136  K 3.8  CL 99*  CO2 25  GLUCOSE 85  BUN 17  CREATININE 0.90  CALCIUM 8.5*  AST 17  ALT 9*   ALKPHOS 87  BILITOT 0.6   ------------------------------------------------------------------------------------------------------------------  Cardiac Enzymes Recent Labs  Lab 02/05/18 1854  TROPONINI <0.03   ------------------------------------------------------------------------------------------------------------------  RADIOLOGY:  Dg Chest Port 1 View  Result Date: 02/05/2018 CLINICAL DATA:  Fever and weakness EXAM: PORTABLE CHEST 1 VIEW COMPARISON:  September 25, 2017 FINDINGS: There is focal consolidation in the medial left base. Lungs elsewhere are clear. Heart is borderline enlarged with pulmonary vascularity within normal limits. No adenopathy. There is an old healed right ninth rib fracture. IMPRESSION: Consolidation medial left base, felt to represent pneumonia. Lungs elsewhere clear. Stable mild cardiomegaly. Followup PA and lateral chest radiographs recommended in 3-4 weeks following trial of antibiotic therapy to ensure resolution and exclude underlying malignancy. Electronically Signed   By: Lowella Grip III M.D.   On: 02/05/2018 19:32     IMPRESSION AND PLAN:   *Left community-acquired pneumonia with acute COPD exacerbation, sepsis and acute on chronic hypoxic respiratory failure -IV steroids, Antibiotics - Scheduled Nebulizers - Inhalers -Wean O2 as tolerated - Consult pulmonary if no improvement Culture sent and pending  Influenza test pending  *Hypertension.  Well controlled.  *Acute toxic encephalopathy secondary to pneumonia.  Will hold sedating medications.  Should improve as her infection gets better.  She does have history of dementia.  *DVT prophylaxis with Lovenox  All the records are reviewed and case discussed with ED provider. Management plans discussed with the patient, family and they are in agreement.  CODE STATUS: FULL CODE  TOTAL TIME TAKING CARE OF THIS PATIENT: 40 minutes.   Neita Carp M.D on 02/05/2018 at 8:27 PM  Between 7am  to 6pm - Pager - [phone]  After 6pm go to [email or web address] - password [email or web address] [facility]  [facility] [place] [facility]  Office  [phone]  CC: Primary care physician; Birdie Sons, MD  Note: This dictation was prepared with Dragon dictation along with smaller phrase technology. Any transcriptional errors that result from this process are unintentional.

## 2018-02-05 NOTE — Progress Notes (Signed)
Patient admitted for weakness and lethargy, admitted as a code sepsis. Patient takes lyrica PTA 300 mg bid Spoke to MD to hold lyrica for now.  MD agrees, will hold lyrica for now until patient can return to baseline mental status.  Thomasene Rippleavid Brynna Dobos, PharmD, BCPS Clinical Pharmacist 02/05/2018

## 2018-02-05 NOTE — Progress Notes (Signed)
Pharmacy Antibiotic Note  Cheryl [name] is a 67 y.o. female admitted on 02/05/2018 with sepsis.  Pharmacy has been consulted for vancomycin dosing.  Plan: Vancomycin 750mg  IV every 24 hours.  Goal trough 15-20 mcg/mL. Vancomycin trough before 4th dose, 3/18@0930  Height: 4\' 10"  (147.3 cm) Weight: 98 lb (44.5 kg) IBW/kg (Calculated) : 40.9  Temp (24hrs), Avg:102.1 F (38.9 C), Min:102.1 F (38.9 C), Max:102.1 F (38.9 C)  Recent Labs  Lab 02/05/18 1854  WBC 5.7  CREATININE 0.90    Estimated Creatinine Clearance: 38.6 mL/min (by C-G formula based on SCr of 0.9 mg/dL).    Allergies  Allergen Reactions  . Percocet [Oxycodone-Acetaminophen] Hives and Rash  . Aspirin Nausea And Vomiting and Other (See Comments)    Reaction:  GI upset   . Codeine Nausea And Vomiting, Other (See Comments) and Nausea Only    Reaction:  GI upset   . Propoxyphene Other (See Comments) and Nausea Only    GI upset Reaction:  GI upset   . Sulfa Antibiotics Rash and Other (See Comments)    Reaction:  GI upset     Antimicrobials this admission: Anti-infectives (From admission, onward)   Start     Dose/Rate Route Frequency Ordered Stop   02/06/18 1000  vancomycin (VANCOCIN) IVPB 750 mg/150 ml premix     750 mg 150 mL/hr over 60 Minutes Intravenous Every 24 hours 02/05/18 1950     02/05/18 1915  vancomycin (VANCOCIN) IVPB 1000 mg/200 mL premix     1,000 mg 200 mL/hr over 60 Minutes Intravenous  Once 02/05/18 1903     02/05/18 1900  piperacillin-tazobactam (ZOSYN) IVPB 3.375 g     3.375 g 100 mL/hr over 30 Minutes Intravenous  Once 02/05/18 1855        Microbiology results: No results found for this or any previous visit (from the past 240 hour(s)).   Thank you for allowing pharmacy to be a part of this patient's care.  Gerre PebblesGarrett Ricard Faulkner 02/05/2018 7:50 PM

## 2018-02-05 NOTE — ED Notes (Signed)
All labs sent at this time.

## 2018-02-05 NOTE — ED Triage Notes (Signed)
Pt brought in by family for fever, generalized weakness, decreased level of consciousness.

## 2018-02-06 LAB — CBC
HEMATOCRIT: 29.2 % — AB (ref 35.0–47.0)
HEMOGLOBIN: 9.4 g/dL — AB (ref 12.0–16.0)
MCH: 27.2 pg (ref 26.0–34.0)
MCHC: 32.1 g/dL (ref 32.0–36.0)
MCV: 84.6 fL (ref 80.0–100.0)
Platelets: 159 10*3/uL (ref 150–440)
RBC: 3.45 MIL/uL — ABNORMAL LOW (ref 3.80–5.20)
RDW: 16.3 % — ABNORMAL HIGH (ref 11.5–14.5)
WBC: 3.8 10*3/uL (ref 3.6–11.0)

## 2018-02-06 LAB — BASIC METABOLIC PANEL
ANION GAP: 11 (ref 5–15)
BUN: 16 mg/dL (ref 6–20)
CO2: 23 mmol/L (ref 22–32)
Calcium: 8.2 mg/dL — ABNORMAL LOW (ref 8.9–10.3)
Chloride: 103 mmol/L (ref 101–111)
Creatinine, Ser: 0.85 mg/dL (ref 0.44–1.00)
GFR calc Af Amer: >60 mL/min (ref >60)
GFR calc non Af Amer: >60 mL/min (ref >60)
GLUCOSE: 101 mg/dL — AB (ref 65–99)
POTASSIUM: 3.6 mmol/L (ref 3.5–5.1)
Sodium: 137 mmol/L (ref 135–145)

## 2018-02-06 MED ORDER — OSELTAMIVIR PHOSPHATE 30 MG PO CAPS
30.0000 mg | ORAL_CAPSULE | Freq: Two times a day (BID) | ORAL | Status: DC
  Administered 2018-02-06 – 2018-02-07 (×4): 30 mg via ORAL
  Filled 2018-02-06 (×5): qty 1

## 2018-02-06 MED ORDER — ENOXAPARIN SODIUM 30 MG/0.3ML ~~LOC~~ SOLN
30.0000 mg | SUBCUTANEOUS | Status: DC
  Administered 2018-02-06: 30 mg via SUBCUTANEOUS
  Filled 2018-02-06: qty 0.3

## 2018-02-06 MED ORDER — FENTANYL 50 MCG/HR TD PT72
50.0000 ug | MEDICATED_PATCH | TRANSDERMAL | Status: DC
  Administered 2018-02-06: 50 ug via TRANSDERMAL
  Filled 2018-02-06: qty 1

## 2018-02-06 MED ORDER — LEVOFLOXACIN IN D5W 250 MG/50ML IV SOLN
250.0000 mg | INTRAVENOUS | Status: DC
  Administered 2018-02-06: 250 mg via INTRAVENOUS
  Filled 2018-02-06 (×2): qty 50

## 2018-02-06 MED ORDER — TRAMADOL HCL 50 MG PO TABS: 50.0000 mg | ORAL_TABLET | Freq: Four times a day (QID) | ORAL | Status: DC | PRN

## 2018-02-06 MED ORDER — GUAIFENESIN ER 600 MG PO TB12
600.0000 mg | ORAL_TABLET | Freq: Two times a day (BID) | ORAL | Status: DC
  Administered 2018-02-06 – 2018-02-07 (×3): 600 mg via ORAL
  Filled 2018-02-06 (×3): qty 1

## 2018-02-06 MED ORDER — GUAIFENESIN-DM 100-10 MG/5ML PO SYRP: 5.0000 mL | ORAL_SOLUTION | ORAL | Status: DC | PRN

## 2018-02-06 NOTE — Care Management (Signed)
Patient admitted from home with respiratory failure related to COPD.  Patient lives at home with husband.  [facility] [address]  PT has assessed patient and recommends SNF. Patient has Rw and nebulizer in the home.  Since 2014 patient has as been open 10 encounters with [facility].  Patient was recently discharged in February 2019.  RNCM following for discharge planning

## 2018-02-06 NOTE — Progress Notes (Signed)
Patient admitted for CAP and ordered levaquin 500 mg IV daily. Patient's CrCl 38.6 ml/min. Dose should be readjusted to levaquin 500 mg IV x 1 (which patient received 03/14 @ 2254).  Spoke to MD to recommend readjusting dose to levaquin 250 mg IV daily to start 03/15 @ 2300. MD notified and agrees with plan -- will readjust levaquin to 250 mg IV daily.  Patient also tested positive for flu A and not on tamiflu, spoke to MD to start tamiflu 30 mg bid per CrCl < 60 ml/min. MD notified and agrees with plan -- will start tamiflu 30 mg bid for 5 days.  Thomasene Rippleavid Jenifer Struve, PharmD, BCPS Clinical Pharmacist 02/06/2018

## 2018-02-06 NOTE — Progress Notes (Signed)
[employer] - [place] at [facility]                                                                                                                                                                                  Patient Demographics   Cheryl [name], is a 67 y.o. female, DOB - 08/30/1951, ZOX:[id number]  Admit date - 02/05/2018   Admitting Physician Milagros Loll, MD  Outpatient Primary MD for the patient is Fisher, Demetrios Isaacs, MD   LOS - 1  Subjective: Patient admitted with pneumonia continues to complaint of cough shortness of breath being very weak    Review of Systems:   CONSTITUTIONAL: No documented fever. No fatigue, positive weakness. No weight gain, no weight loss.  EYES: No blurry or double vision.  ENT: No tinnitus. No postnasal drip. No redness of the oropharynx.  RESPIRATORY: Positive cough, no wheeze, no hemoptysis.  Positive dyspnea.  CARDIOVASCULAR: No chest pain. No orthopnea. No palpitations. No syncope.  GASTROINTESTINAL: No nausea, no vomiting or diarrhea. No abdominal pain. No melena or hematochezia.  GENITOURINARY: No dysuria or hematuria.  ENDOCRINE: No polyuria or nocturia. No heat or cold intolerance.  HEMATOLOGY: No anemia. No bruising. No bleeding.  INTEGUMENTARY: No rashes. No lesions.  MUSCULOSKELETAL: No arthritis. No swelling. No gout.  NEUROLOGIC: No numbness, tingling, or ataxia. No seizure-type activity.  PSYCHIATRIC: No anxiety. No insomnia. No ADD.    Vitals:   Vitals:   02/06/18 0400 02/06/18 0533 02/06/18 0535 02/06/18 1300  BP:  (!) 133/42  (!) 131/47  Pulse:  70 67 72  Resp:  (!) 22  (!) 21  Temp:  98.3 F (36.8 C)  98 F (36.7 C)  TempSrc:  Oral  Oral  SpO2:  (!) 51% 97% 98%  Weight: 98 lb 12.3 oz (44.8 kg)     Height:        Wt Readings from Last 3 Encounters:  02/06/18 98 lb 12.3 oz (44.8 kg)  01/22/18 101 lb (45.8 kg)  12/02/17 98 lb (44.5 kg)     Intake/Output Summary (Last 24 hours) at 02/06/2018 1413 Last  data filed at 02/06/2018 0936 Gross per 24 hour  Intake 3324 ml  Output 2150 ml  Net 1174 ml    Physical Exam:   GENERAL: Pleasant-appearing in no apparent distress.  HEAD, EYES, EARS, NOSE AND THROAT: Atraumatic, normocephalic. Extraocular muscles are intact. Pupils equal and reactive to light. Sclerae anicteric. No conjunctival injection. No oro-pharyngeal erythema.  NECK: Supple. There is no jugular venous distention. No bruits, no lymphadenopathy, no thyromegaly.  HEART: Regular rate and rhythm,. No murmurs, no rubs, no clicks.  LUNGS: Bilateral wheezing throughout both lung  ABDOMEN: Soft, flat, nontender, nondistended. Has good bowel sounds. No hepatosplenomegaly appreciated.  EXTREMITIES: No evidence of any cyanosis, clubbing, or peripheral edema.  +2 pedal and radial pulses bilaterally.  NEUROLOGIC: The patient is alert, awake, and oriented x3 with no focal motor or sensory deficits appreciated bilaterally.  SKIN: Moist and warm with no rashes appreciated.  Psych: Not anxious, depressed LN: No inguinal LN enlargement    Antibiotics   Anti-infectives (From admission, onward)   Start     Dose/Rate Route Frequency Ordered Stop   02/06/18 2300  Levofloxacin (LEVAQUIN) IVPB 250 mg     250 mg 50 mL/hr over 60 Minutes Intravenous Every 24 hours 02/06/18 0241     02/06/18 1000  vancomycin (VANCOCIN) IVPB 750 mg/150 ml premix  Status:  Discontinued     750 mg 150 mL/hr over 60 Minutes Intravenous Every 24 hours 02/05/18 1950 02/05/18 2022   02/06/18 0300  oseltamivir (TAMIFLU) capsule 30 mg     30 mg Oral 2 times daily 02/06/18 0247 02/10/18 2159   02/05/18 2030  levofloxacin (LEVAQUIN) IVPB 500 mg  Status:  Discontinued     500 mg 100 mL/hr over 60 Minutes Intravenous Every 24 hours 02/05/18 2023 02/06/18 0241   02/05/18 1915  vancomycin (VANCOCIN) IVPB 1000 mg/200 mL premix  Status:  Discontinued     1,000 mg 200 mL/hr over 60 Minutes Intravenous  Once 02/05/18 1903 02/05/18  2052   02/05/18 1900  piperacillin-tazobactam (ZOSYN) IVPB 3.375 g     3.375 g 100 mL/hr over 30 Minutes Intravenous  Once 02/05/18 1855 02/05/18 2007      Medications   Scheduled Meds: . clopidogrel  75 mg Oral Daily  . donepezil  10 mg Oral QHS  . enoxaparin (LOVENOX) injection  30 mg Subcutaneous Q24H  . [START ON 02/07/2018] estropipate  0.75 mg Oral QODAY  . fentaNYL  50 mcg Transdermal Q72H  . ipratropium-albuterol  3 mL Nebulization Q6H  . methylPREDNISolone (SOLU-MEDROL) injection  60 mg Intravenous Q24H  . mometasone-formoterol  2 puff Inhalation BID  . oseltamivir  30 mg Oral BID  . pantoprazole  40 mg Oral BID  . simvastatin  10 mg Oral QHS  . tiotropium  18 mcg Inhalation Daily   Continuous Infusions: . levofloxacin (LEVAQUIN) IV     PRN Meds:.acetaminophen **OR** acetaminophen, albuterol, ALPRAZolam, celecoxib, cyclobenzaprine, diphenoxylate-atropine, ondansetron **OR** ondansetron (ZOFRAN) IV, polyethylene glycol, traMADol   Data Review:   Micro Results Recent Results (from the past 240 hour(s))  Blood Culture (routine x 2)     Status: None (Preliminary result)   Collection Time: 02/05/18  7:12 PM  Result Value Ref Range Status   Specimen Description BLOOD RAC  Final   Special Requests   Final    BOTTLES DRAWN AEROBIC AND ANAEROBIC Blood Culture adequate volume   Culture   Final    NO GROWTH < 24 HOURS Performed at [facility], [address] [place], [place] [address]    Report Status PENDING  Incomplete  Blood Culture (routine x 2)     Status: None (Preliminary result)   Collection Time: 02/05/18  7:12 PM  Result Value Ref Range Status   Specimen Description BLOOD LAC  Final   Special Requests   Final    BOTTLES DRAWN AEROBIC AND ANAEROBIC Blood Culture adequate volume   Culture   Final    NO GROWTH < 24 HOURS Performed at [facility], [address] [place], [place] [address]  Report Status PENDING  Incomplete  MRSA PCR  Screening     Status: None   Collection Time: 02/05/18  9:57 PM  Result Value Ref Range Status   MRSA by PCR NEGATIVE NEGATIVE Final    Comment:        The GeneXpert MRSA Assay (FDA approved for NASAL specimens only), is one component of a comprehensive MRSA colonization surveillance program. It is not intended to diagnose MRSA infection nor to guide or monitor treatment for MRSA infections. Performed at [facility], [address], [place], [place] [address]     Radiology Reports Dg Chest [place] 1 View  Result Date: 02/05/2018 CLINICAL DATA:  Fever and weakness EXAM: PORTABLE CHEST 1 VIEW COMPARISON:  September 25, 2017 FINDINGS: There is focal consolidation in the medial left base. Lungs elsewhere are clear. Heart is borderline enlarged with pulmonary vascularity within normal limits. No adenopathy. There is an old healed right ninth rib fracture. IMPRESSION: Consolidation medial left base, felt to represent pneumonia. Lungs elsewhere clear. Stable mild cardiomegaly. Followup PA and lateral chest radiographs recommended in 3-4 weeks following trial of antibiotic therapy to ensure resolution and exclude underlying malignancy. Electronically Signed   By: Bretta Bang III M.D.   On: 02/05/2018 19:32     CBC Recent Labs  Lab 02/05/18 1854 02/06/18 0544  WBC 5.7 3.8  HGB 9.3* 9.4*  HCT 28.6* 29.2*  PLT 185 159  MCV 84.5 84.6  MCH 27.6 27.2  MCHC 32.7 32.1  RDW 16.3* 16.3*  LYMPHSABS 0.3*  --   MONOABS 0.4  --   EOSABS 0.0  --   BASOSABS 0.0  --     Chemistries  Recent Labs  Lab 02/05/18 1854 02/06/18 0544  NA 136 137  K 3.8 3.6  CL 99* 103  CO2 25 23  GLUCOSE 85 101*  BUN 17 16  CREATININE 0.90 0.85  CALCIUM 8.5* 8.2*  AST 17  --   ALT 9*  --   ALKPHOS 87  --   BILITOT 0.6  --    ------------------------------------------------------------------------------------------------------------------ estimated creatinine clearance is 40.9 mL/min (by C-G  formula based on SCr of 0.85 mg/dL). ------------------------------------------------------------------------------------------------------------------ No results for input(s): HGBA1C in the last 72 hours. ------------------------------------------------------------------------------------------------------------------ No results for input(s): CHOL, HDL, LDLCALC, TRIG, CHOLHDL, LDLDIRECT in the last 72 hours. ------------------------------------------------------------------------------------------------------------------ No results for input(s): TSH, T4TOTAL, T3FREE, THYROIDAB in the last 72 hours.  Invalid input(s): FREET3 ------------------------------------------------------------------------------------------------------------------ No results for input(s): VITAMINB12, FOLATE, FERRITIN, TIBC, IRON, RETICCTPCT in the last 72 hours.  Coagulation profile Recent Labs  Lab 02/05/18 1854  INR 0.92    No results for input(s): DDIMER in the last 72 hours.  Cardiac Enzymes Recent Labs  Lab 02/05/18 1854  TROPONINI <0.03   ------------------------------------------------------------------------------------------------------------------ Invalid input(s): POCBNP    Assessment & Plan   *Acute respiratory failure due to COPD exacerbation likely bronchitis Continue therapy with nebulizer IV Solu-Medrol IV antibiotic  *Influenza A continue therapy with Tamiflu  *Acute on chronic COPD exasperation continue nebulizer therapy Solu-Medrol I will add Mucinex to her current regimen  *Acute toxic encephalopathy secondary to pneumonia.    Now improved  *Hyperlipidemia continue simvastatin  *Chronic pain resume fentanyl patch   *Hypertension.  Well controlled.  *Acute toxic encephalopathy secondary to pneumonia.  Will hold sedating medications.  Should improve as her infection gets better.  She does have history of dementia.        Code Status Orders  (From admission,  onward)  Start     Ordered   02/05/18 2024  Full code  Continuous     02/05/18 2025    Code Status History    Date Active Date Inactive Code Status Order ID Comments User Context   04/29/2017 13:14 05/02/2017 21:48 Full Code [id number]  Katha Hamming, MD ED   12/23/2016 17:45 12/25/2016 18:49 Full Code [id number]  Katharina Caper, MD ED   11/06/2016 09:00 11/07/2016 18:53 Full Code [id number]  Delfino Lovett, MD Inpatient   11/05/2016 14:01 11/06/2016 09:00 Partial Code [id number]  Adrian Saran, MD Inpatient   06/03/2016 02:53 06/05/2016 19:45 Full Code [id number]  Gery Pray, MD Inpatient   11/19/2015 12:18 11/24/2015 16:24 Full Code [id number]  Shaune Pollack, MD Inpatient   11/09/2015 23:02 11/13/2015 18:51 Full Code [id number]  Wyatt Haste, MD ED   10/17/2015 22:26 10/22/2015 13:03 Full Code [id number]  Enedina Finner, MD Inpatient   10/09/2015 17:38 10/12/2015 19:34 Full Code [id number]  Shaune Pollack, MD Inpatient   08/01/2015 00:08 08/03/2015 14:54 Full Code [id number]  Oralia Manis, MD Inpatient    Advance Directive Documentation     Most Recent Value  Type of Advance Directive  Healthcare Power of Attorney, Living will  Pre-existing out of facility DNR order (yellow form or pink MOST form)  No data  "MOST" Form in Place?  No data           Consults  none  DVT Prophylaxis  Lovenox   Lab Results  Component Value Date   PLT 159 02/06/2018     Time Spent in minutes   Greater than 50% of time spent in care coordination and counseling patient regarding the condition and plan of care.   Auburn Bilberry M.D on 02/06/2018 at 2:13 PM  Between 7am to 6pm - Pager - [phone]  After 6pm go to [email or web address] - Social research officer, government  [employer]   Office  [phone]

## 2018-02-06 NOTE — Progress Notes (Signed)
Pharmacy lovenox dose adjustment. Patient ordered lovenox 40 mg subq daily for VTE prophylaxis. Patient's TBW < 45 kg (patient almost received 1mg /kg qday)  Will reduce dose to lovenox 30 mg subq daily.  Thomasene Rippleavid Makylie Rivere, PharmD, BCPS Clinical Pharmacist 02/06/2018

## 2018-02-06 NOTE — Care Management Important Message (Signed)
Important Message  Patient Details  Name: Cheryl [name] MRN: [id number] Date of Birth: 05-23-1951   Medicare Important Message Given:  Yes    Chapman FitchBOWEN, Jamilyn Pigeon T, RN 02/06/2018, 4:26 PM

## 2018-02-06 NOTE — Evaluation (Signed)
Physical Therapy Evaluation Patient Details Name: Cheryl [name] MRN: [id number] DOB: [birth year] Today's Date: 02/06/2018   History of Present Illness  67 yo female with husband present for PT eval was admitted for PNA and sepsis with droplet precautions for flu A.  Noted nodules on lungs, has toxic encephalopathy.  PMHx:  dementia, HTN, COPD, smoker, pain management with fentanyl, scoliosis, PVD, DVT, asthma, rib fracture, PN, O2 use at home  Clinical Impression  Pt is up to side of bed but so weak she cannot stand.  Has been diagnosed with flu A and feels warm to touch, so may be much more active as the meds start to manage her illness.  Husband is there and reports pt has gone to a SNF in the past, may be appropriate for her again if she is able to be admitted.  Follow acutely for therapy with strengthening and initiating more standing mobility as tolerated.  Will need to use RW and has one at home.  On O2 at home PRN and will monitor sats and pulse with therapy.    Follow Up Recommendations SNF    Equipment Recommendations  None recommended by PT(has RW)    Recommendations for Other Services       Precautions / Restrictions Precautions Precautions: Fall Precaution Comments: on O2 3L but at home PRN Restrictions Weight Bearing Restrictions: No  Monitor O2 sats during therapy.     Mobility  Bed Mobility Overal bed mobility: Needs Assistance Bed Mobility: Supine to Sit;Sit to Supine     Supine to sit: Max assist Sit to supine: Total assist   General bed mobility comments: pt was unable to get up and remain up without conitnual spinal support  Transfers Overall transfer level: (declined)               General transfer comment: pt unable to attempt, too weak  Ambulation/Gait             General Gait Details: unable due to flu  Stairs            Wheelchair Mobility    Modified Rankin (Stroke Patients Only)       Balance Overall balance  assessment: Needs assistance Sitting-balance support: Feet supported;Bilateral upper extremity supported Sitting balance-Leahy Scale: Poor                                       Pertinent Vitals/Pain Pain Assessment: Faces Faces Pain Scale: Hurts even more Pain Location: general back and shoulders Pain Descriptors / Indicators: Aching Pain Intervention(s): Limited activity within patient's tolerance;Monitored during session;Repositioned    Home Living Family/patient expects to be discharged to:: Private residence Living Arrangements: Spouse/significant other Available Help at Discharge: Family;Available 24 hours/day Type of Home: House Home Access: Ramped entrance     Home Layout: One level Home Equipment: Walker - 2 wheels      Prior Function Level of Independence: Independent with assistive device(s)         Comments: husband reports she was walking independently on RW     Hand Dominance   Dominant Hand: Right    Extremity/Trunk Assessment   Upper Extremity Assessment Upper Extremity Assessment: Generalized weakness    Lower Extremity Assessment Lower Extremity Assessment: Generalized weakness    Cervical / Trunk Assessment Cervical / Trunk Assessment: Kyphotic  Communication   Communication: No difficulties  Cognition Arousal/Alertness: Lethargic Behavior During Therapy:  Flat affect Overall Cognitive Status: History of cognitive impairments - at baseline                                 General Comments: somewhat difficult to get information but husband answers questions      General Comments      Exercises     Assessment/Plan    PT Assessment Patient needs continued PT services  PT Problem List Decreased strength;Decreased range of motion;Decreased activity tolerance;Decreased balance;Decreased mobility;Decreased coordination;Decreased cognition;Decreased knowledge of use of DME;Decreased safety awareness;Pain        PT Treatment Interventions DME instruction;Gait training;Functional mobility training;Therapeutic activities;Therapeutic exercise;Balance training;Neuromuscular re-education;Patient/family education    PT Goals (Current goals can be found in the Care Plan section)  Acute Rehab PT Goals Patient Stated Goal: none stated, very sick PT Goal Formulation: With family Time For Goal Achievement: 02/20/18 Potential to Achieve Goals: Good    Frequency Min 2X/week   Barriers to discharge Other (comment)(pt is 2 person assist now, may need more therapy to get home)      Co-evaluation               AM-PAC PT "6 Clicks" Daily Activity  Outcome Measure Difficulty turning over in bed (including adjusting bedclothes, sheets and blankets)?: Unable Difficulty moving from lying on back to sitting on the side of the bed? : Unable Difficulty sitting down on and standing up from a chair with arms (e.g., wheelchair, bedside commode, etc,.)?: Unable Help needed moving to and from a bed to chair (including a wheelchair)?: A Lot Help needed walking in hospital room?: Total Help needed climbing 3-5 steps with a railing? : Total 6 Click Score: 7    End of Session Equipment Utilized During Treatment: Oxygen Activity Tolerance: Patient limited by fatigue;Patient limited by lethargy Patient left: in bed;with call bell/phone within reach;with bed alarm set;with family/visitor present Nurse Communication: Mobility status PT Visit Diagnosis: Muscle weakness (generalized) (M62.81);Adult, failure to thrive (R62.7);Pain;Difficulty in walking, not elsewhere classified (R26.2) Pain - Right/Left: (B back) Pain - part of body: (back)    Time: [id number] PT Time Calculation (min) (ACUTE ONLY): 30 min   Charges:   PT Evaluation $PT Eval Moderate Complexity: 1 Mod PT Treatments $Therapeutic Activity: 8-22 mins   PT G Codes:   PT G-Codes **NOT FOR INPATIENT CLASS** Functional Assessment Tool Used: AM-PAC  6 Clicks Basic Mobility    Ivar DrapeRuth E Mukund Weinreb 02/06/2018, 11:01 AM   Samul Dadauth Thayne Cindric, PT MS Acute Rehab Dept. Number: [facility] [phone] and [facility] [phone]

## 2018-02-07 LAB — URINE CULTURE: CULTURE: NO GROWTH

## 2018-02-07 MED ORDER — PREDNISONE 50 MG PO TABS: 50.0000 mg | ORAL_TABLET | Freq: Every day | ORAL | Status: DC

## 2018-02-07 MED ORDER — LEVOFLOXACIN 250 MG PO TABS: 250.0000 mg | ORAL_TABLET | Freq: Every day | ORAL | 0 refills | Status: DC

## 2018-02-07 MED ORDER — PREDNISONE 10 MG PO TABS: ORAL_TABLET | ORAL | 0 refills | Status: DC

## 2018-02-07 MED ORDER — OSELTAMIVIR PHOSPHATE 30 MG PO CAPS: 30.0000 mg | ORAL_CAPSULE | Freq: Two times a day (BID) | ORAL | 0 refills | Status: AC

## 2018-02-07 MED ORDER — LEVOFLOXACIN 250 MG PO TABS
250.0000 mg | ORAL_TABLET | Freq: Every day | ORAL | Status: DC
  Filled 2018-02-07: qty 1

## 2018-02-07 NOTE — Discharge Instructions (Signed)
Use your oxygen and nebulizer as before. °

## 2018-02-07 NOTE — Clinical Social Work Note (Signed)
The CSW is aware via chart review that this patient has chosen to discharge home rather than pursue STR. CSW is signing off. Please consult should additional needs arise.   Argentina PonderKaren Martha Jacklyn Branan, MSW, Theresia MajorsLCSWA [phone]

## 2018-02-07 NOTE — Plan of Care (Signed)
Pt is still on 3L of oxygen. [facility]

## 2018-02-07 NOTE — Care Management (Signed)
Patient for discharge home.  She has chronic home oxygen and family has been informed to bring portable tank for transport.  Notified [facility] of discharge.  Order present for RN PT and Aide

## 2018-02-07 NOTE — Progress Notes (Signed)
Cheryl [name] to be D/C'd Home per MD order.  Discussed prescriptions and follow up appointments with the patient. Prescriptions given to patient, medication list explained in detail. Pt verbalized understanding. Home health has been set up by Nann.   Allergies as of 02/07/2018      Reactions   Percocet [oxycodone-acetaminophen] Hives, Rash   Aspirin Nausea And Vomiting, Other (See Comments)   Reaction:  GI upset    Codeine Nausea And Vomiting, Other (See Comments), Nausea Only   Reaction:  GI upset    Propoxyphene Other (See Comments), Nausea Only   GI upset Reaction:  GI upset    Sulfa Antibiotics Rash, Other (See Comments)   Reaction:  GI upset       Medication List    TAKE these medications   acetaminophen 325 MG tablet Commonly known as:  TYLENOL Take 2 tablets (650 mg total) by mouth every 6 (six) hours as needed for mild pain (or Fever >/= 101).   albuterol (2.5 MG/3ML) 0.083% nebulizer solution Commonly known as:  PROVENTIL Take 3 mLs (2.5 mg total) by nebulization every 4 (four) hours as needed for wheezing.   albuterol 108 (90 Base) MCG/ACT inhaler Commonly known as:  PROVENTIL HFA;VENTOLIN HFA Inhale 2 puffs into the lungs every 4 (four) hours as needed for wheezing or shortness of breath.   alendronate 70 MG tablet Commonly known as:  FOSAMAX Take 1 tablet (70 mg total) by mouth every 7 (seven) days. Take with a full glass of water on an empty stomach.   ALPRAZolam 0.5 MG tablet Commonly known as:  XANAX take 1 tablet by mouth three times a day if needed   benzonatate 100 MG capsule Commonly known as:  TESSALON Take 1 capsule (100 mg total) by mouth 2 (two) times daily as needed for cough.   celecoxib 100 MG capsule Commonly known as:  CELEBREX Take 1 capsule (100 mg total) by mouth 2 (two) times daily as needed.   clopidogrel 75 MG tablet Commonly known as:  PLAVIX Take 75 mg by mouth daily.   cyclobenzaprine 5 MG tablet Commonly known as:   FLEXERIL Take 1 tablet (5 mg total) by mouth 3 (three) times daily as needed for muscle spasms.   diphenoxylate-atropine 2.5-0.025 MG tablet Commonly known as:  LOMOTIL Take 2 tablets by mouth 4 (four) times daily as needed for diarrhea or loose stools. Reported on 03/11/2016   donepezil 10 MG tablet Commonly known as:  ARICEPT Take 10 mg by mouth at bedtime.   estropipate 0.75 MG tablet Commonly known as:  OGEN Take 1 tablet (0.75 mg total) by mouth every other day.   fentaNYL 50 MCG/HR Commonly known as:  DURAGESIC - dosed mcg/hr Place 1 patch (50 mcg total) onto the skin every 3 (three) days. Start taking on:  04/22/2018 What changed:  Another medication with the same name was removed. Continue taking this medication, and follow the directions you see here.   Fluticasone-Salmeterol 250-50 MCG/DOSE Aepb Commonly known as:  ADVAIR DISKUS inhale 1 dose by mouth twice a day   furosemide 40 MG tablet Commonly known as:  LASIX Take 40 mg by mouth every other day.   levofloxacin 250 MG tablet Commonly known as:  LEVAQUIN Take 1 tablet (250 mg total) by mouth daily for 5 days.   LYRICA 300 MG capsule Generic drug:  pregabalin Take 300 mg by mouth 2 (two) times daily.   mupirocin ointment 2 % Commonly known as:  [employer]  Place 1 application into the nose 2 (two) times daily.   NARCAN 4 MG/0.1ML Liqd nasal spray kit Generic drug:  naloxone Place 0.4 mg into the nose once.   oseltamivir 30 MG capsule Commonly known as:  TAMIFLU Take 1 capsule (30 mg total) by mouth 2 (two) times daily for 3 days.   pantoprazole 40 MG tablet Commonly known as:  PROTONIX Take 1 tablet (40 mg total) by mouth 2 (two) times daily.   predniSONE 10 MG tablet Commonly known as:  DELTASONE To 50 mg daily.  Taper by 10 mg daily then stop Start taking on:  02/08/2018   silver sulfADIAZINE 1 % cream Commonly known as:  SILVADENE Apply topically daily.   simvastatin 10 MG tablet Commonly known  as:  ZOCOR Take 10 mg by mouth at bedtime. Reported on 12/04/2015   SUMAtriptan 25 MG tablet Commonly known as:  IMITREX Take 1 tablet (25 mg total) by mouth as needed for migraine. May repeat in 2 hours if headache persists or recurs.   tiotropium 18 MCG inhalation capsule Commonly known as:  SPIRIVA HANDIHALER inhale the contents of one capsule in the handihaler once daily   traMADol 50 MG tablet Commonly known as:  ULTRAM Take 2 tablets (100 mg total) by mouth every 6 (six) hours as needed for moderate pain or severe pain. Start taking on:  02/21/2018 What changed:  Another medication with the same name was removed. Continue taking this medication, and follow the directions you see here.   triamcinolone ointment 0.5 % Commonly known as:  KENALOG Apply to lesions on hands twice a day as needed       Vitals:   02/07/18 0440 02/07/18 0832  BP: (!) 141/58   Pulse: 76   Resp: 18   Temp: (!) 97.5 F (36.4 C)   SpO2: 90% 96%    Skin clean, dry and intact without evidence of skin break down, no evidence of skin tears noted. IV catheter discontinued intact. Site without signs and symptoms of complications. Dressing and pressure applied. Pt denies pain at this time. No complaints noted.  An After Visit Summary was printed and given to the patient. Patient escorted via [facility] with pts portable oxygen, and D/C home via private auto.  Sharalyn Ink

## 2018-02-07 NOTE — Progress Notes (Signed)
PHARMACIST - PHYSICIAN COMMUNICATION  CONCERNING: Antibiotic IV to Oral Route Change Policy  RECOMMENDATION: This patient is receiving Levofloxacin 250mg  by the intravenous route.  Based on criteria approved by the Pharmacy and Therapeutics Committee, the antibiotic(s) is/are being converted to the equivalent oral dose form(s).   DESCRIPTION: These criteria include:  Patient being treated for a respiratory tract infection, urinary tract infection, cellulitis or clostridium difficile associated diarrhea if on metronidazole  The patient is not neutropenic and does not exhibit a GI malabsorption state  The patient is eating (either orally or via tube) and/or has been taking other orally administered medications for a least 24 hours  The patient is improving clinically and has a Tmax < 100.5  If you have questions about this conversion, please contact the Pharmacy Department   Gardner CandleSheema M Sequoya Hogsett, PharmD, BCPS Clinical Pharmacist 02/07/2018 8:06 AM

## 2018-02-07 NOTE — Discharge Summary (Signed)
[facility] NAME: Cheryl [name]    MR#:  [id number]  DATE OF BIRTH:  [birth year]  DATE OF ADMISSION:  02/05/2018 ADMITTING PHYSICIAN: Cheryl Bow, MD  DATE OF DISCHARGE: 02/07/2018  PRIMARY CARE PHYSICIAN: Cheryl Sons, MD    ADMISSION DIAGNOSIS:  Sepsis, due to unspecified organism ([place]) [A41.9] Community acquired pneumonia of left lower lobe of lung ([place]) [J18.1]  DISCHARGE DIAGNOSIS:  Sepsis on admission Left middle lobe pneumonia Influenza A Acute on chronic hypoxic respiratory failure secondary to COPD exacerbation  SECONDARY DIAGNOSIS:   Past Medical History:  Diagnosis Date  . Allergy   . Anxiety   . Arthritis   . Aspiration pneumonitis ([place]) 11/24/2015  . Asthma   . Chronic pain   . COPD (chronic obstructive pulmonary disease) ([place])   . Coronary artery disease   . DVT (deep venous thrombosis) ([place])   . GERD (gastroesophageal reflux disease)   . Headache   . Hyperlipidemia   . Hypertension   . Kyphoscoliosis deformity of spine   . Migraines   . Neuropathy 2010  . Osteoporosis   . Oxygen deficiency   . Peripheral vascular disease ([place])   . Pneumonia   . Pneumonia 11/19/2015  . Pneumonia 10/2016  . Pneumonia    aspiration 2018- 4 times in last year  . Vitamin D deficiency     HOSPITAL COURSE:  Cheryl [name]  is a 67 y.o. female with a known history of COPD, tobacco use, hypertension, chronic pain syndrome on fentanyl patch, oxygen use as needed at home presents to the emergency room brought in by family due to lethargy, wheezing and poor oral intake.  Patient here has been found to have left-sided pneumonia along with sepsis   *Acute on chronic respiratory failure due to COPD exacerbation Continue therapy with nebulizer IV Solu-Medrol--- changed to oral steroid IV antibiotic--- oral Levaquin for 5 more days  *Influenza A continue therapy with Tamiflu for 5 days total  *Acute on chronic  COPD exasperation  -continue nebulizer therapy  -Steroid taper -PRN Mucinex   *Acute toxic encephalopathy secondary to pneumonia.   Now improved  *Hyperlipidemia continue simvastatin  *Chronic pain resume fentanyl patch   *Hypertension. Well controlled.  *Acute toxic encephalopathy secondary to pneumonia.  Mentation has improved.  -  Patient overall feels better.  Physical therapy saw patient and recommended rehab.  Patient is wanting to go home which I agree given improvement in patient's condition.  Patient's husband is agreeable for patient to go home.  Will arrange home health RN PT.  She already has a rolling walker and other equipment at home.  Patient already has oxygen also at home including nebulizer.  Will discharge to home with follow-up outpatient Cheryl [name] in a week.   CONSULTS OBTAINED:    DRUG ALLERGIES:   Allergies  Allergen Reactions  . Percocet [Oxycodone-Acetaminophen] Hives and Rash  . Aspirin Nausea And Vomiting and Other (See Comments)    Reaction:  GI upset   . Codeine Nausea And Vomiting, Other (See Comments) and Nausea Only    Reaction:  GI upset   . Propoxyphene Other (See Comments) and Nausea Only    GI upset Reaction:  GI upset   . Sulfa Antibiotics Rash and Other (See Comments)    Reaction:  GI upset     DISCHARGE MEDICATIONS:   Allergies as of 02/07/2018      Reactions   Percocet [oxycodone-acetaminophen] Hives, Rash  Aspirin Nausea And Vomiting, Other (See Comments)   Reaction:  GI upset    Codeine Nausea And Vomiting, Other (See Comments), Nausea Only   Reaction:  GI upset    Propoxyphene Other (See Comments), Nausea Only   GI upset Reaction:  GI upset    Sulfa Antibiotics Rash, Other (See Comments)   Reaction:  GI upset       Medication List    TAKE these medications   acetaminophen 325 MG tablet Commonly known as:  TYLENOL Take 2 tablets (650 mg total) by mouth every 6 (six) hours as needed for mild pain (or Fever  >/= 101).   albuterol (2.5 MG/3ML) 0.083% nebulizer solution Commonly known as:  PROVENTIL Take 3 mLs (2.5 mg total) by nebulization every 4 (four) hours as needed for wheezing.   albuterol 108 (90 Base) MCG/ACT inhaler Commonly known as:  PROVENTIL HFA;VENTOLIN HFA Inhale 2 puffs into the lungs every 4 (four) hours as needed for wheezing or shortness of breath.   alendronate 70 MG tablet Commonly known as:  FOSAMAX Take 1 tablet (70 mg total) by mouth every 7 (seven) days. Take with a full glass of water on an empty stomach.   ALPRAZolam 0.5 MG tablet Commonly known as:  XANAX take 1 tablet by mouth three times a day if needed   benzonatate 100 MG capsule Commonly known as:  TESSALON Take 1 capsule (100 mg total) by mouth 2 (two) times daily as needed for cough.   celecoxib 100 MG capsule Commonly known as:  CELEBREX Take 1 capsule (100 mg total) by mouth 2 (two) times daily as needed.   clopidogrel 75 MG tablet Commonly known as:  PLAVIX Take 75 mg by mouth daily.   cyclobenzaprine 5 MG tablet Commonly known as:  FLEXERIL Take 1 tablet (5 mg total) by mouth 3 (three) times daily as needed for muscle spasms.   diphenoxylate-atropine 2.5-0.025 MG tablet Commonly known as:  LOMOTIL Take 2 tablets by mouth 4 (four) times daily as needed for diarrhea or loose stools. Reported on 03/11/2016   donepezil 10 MG tablet Commonly known as:  ARICEPT Take 10 mg by mouth at bedtime.   estropipate 0.75 MG tablet Commonly known as:  OGEN Take 1 tablet (0.75 mg total) by mouth every other day.   fentaNYL 50 MCG/HR Commonly known as:  DURAGESIC - dosed mcg/hr Place 1 patch (50 mcg total) onto the skin every 3 (three) days. Start taking on:  04/22/2018 What changed:  Another medication with the same name was removed. Continue taking this medication, and follow the directions you see here.   Fluticasone-Salmeterol 250-50 MCG/DOSE Aepb Commonly known as:  ADVAIR DISKUS inhale 1 dose  by mouth twice a day   furosemide 40 MG tablet Commonly known as:  LASIX Take 40 mg by mouth every other day.   levofloxacin 250 MG tablet Commonly known as:  LEVAQUIN Take 1 tablet (250 mg total) by mouth daily for 5 days.   LYRICA 300 MG capsule Generic drug:  pregabalin Take 300 mg by mouth 2 (two) times daily.   mupirocin ointment 2 % Commonly known as:  BACTROBAN Place 1 application into the nose 2 (two) times daily.   NARCAN 4 MG/0.1ML Liqd nasal spray kit Generic drug:  naloxone Place 0.4 mg into the nose once.   oseltamivir 30 MG capsule Commonly known as:  TAMIFLU Take 1 capsule (30 mg total) by mouth 2 (two) times daily for 3 days.   pantoprazole  40 MG tablet Commonly known as:  PROTONIX Take 1 tablet (40 mg total) by mouth 2 (two) times daily.   predniSONE 10 MG tablet Commonly known as:  DELTASONE To 50 mg daily.  Taper by 10 mg daily then stop Start taking on:  02/08/2018   silver sulfADIAZINE 1 % cream Commonly known as:  SILVADENE Apply topically daily.   simvastatin 10 MG tablet Commonly known as:  ZOCOR Take 10 mg by mouth at bedtime. Reported on 12/04/2015   SUMAtriptan 25 MG tablet Commonly known as:  IMITREX Take 1 tablet (25 mg total) by mouth as needed for migraine. May repeat in 2 hours if headache persists or recurs.   tiotropium 18 MCG inhalation capsule Commonly known as:  SPIRIVA HANDIHALER inhale the contents of one capsule in the handihaler once daily   traMADol 50 MG tablet Commonly known as:  ULTRAM Take 2 tablets (100 mg total) by mouth every 6 (six) hours as needed for moderate pain or severe pain. Start taking on:  02/21/2018 What changed:  Another medication with the same name was removed. Continue taking this medication, and follow the directions you see here.   triamcinolone ointment 0.5 % Commonly known as:  KENALOG Apply to lesions on hands twice a day as needed       If you experience worsening of your admission  symptoms, develop shortness of breath, life threatening emergency, suicidal or homicidal thoughts you must seek medical attention immediately by calling 911 or calling your MD immediately  if symptoms less severe.  You Must read complete instructions/literature along with all the possible adverse reactions/side effects for all the Medicines you take and that have been prescribed to you. Take any new Medicines after you have completely understood and accept all the possible adverse reactions/side effects.   Please note  You were cared for by a hospitalist during your hospital stay. If you have any questions about your discharge medications or the care you received while you were in the hospital after you are discharged, you can call the unit and asked to speak with the hospitalist on call if the hospitalist that took care of you is not available. Once you are discharged, your primary care physician will handle any further medical issues. Please note that NO REFILLS for any discharge medications will be authorized once you are discharged, as it is imperative that you return to your primary care physician (or establish a relationship with a primary care physician if you do not have one) for your aftercare needs so that they can reassess your need for medications and monitor your lab values. Today   SUBJECTIVE   Feels a lot better.  Appetite improving.  Breathing better.  VITAL SIGNS:  Blood pressure (!) 141/58, pulse 76, temperature (!) 97.5 F (36.4 C), temperature source Oral, resp. rate 18, height _0  (1.473 m), weight 45.1 kg (99 lb 6.8 oz), SpO2 96 %.  I/O:    Intake/Output Summary (Last 24 hours) at 02/07/2018 1455 Last data filed at 02/07/2018 0306 Gross per 24 hour  Intake -  Output 350 ml  Net -350 ml    PHYSICAL EXAMINATION:  GENERAL:  67 y.o.-year-old patient lying in the bed with no acute distress.  Thin cachectic EYES: Pupils equal, round, reactive to light and accommodation.  No scleral icterus. Extraocular muscles intact.  HEENT: Head atraumatic, normocephalic. Oropharynx and nasopharynx clear.  NECK:  Supple, no jugular venous distention. No thyroid enlargement, no tenderness.  LUNGS: Normal breath  sounds bilaterally, no wheezing, rales,rhonchi or crepitation. No use of accessory muscles of respiration.  CARDIOVASCULAR: S1, S2 normal. No murmurs, rubs, or gallops.  ABDOMEN: Soft, non-tender, non-distended. Bowel sounds present. No organomegaly or mass.  EXTREMITIES: No pedal edema, cyanosis, or clubbing.  NEUROLOGIC: Cranial nerves II through XII are intact. Muscle strength 5/5 in all extremities. Sensation intact. Gait not checked.  PSYCHIATRIC: The patient is alert and oriented x 3.  SKIN: No obvious rash, lesion, or ulcer.   DATA REVIEW:   CBC  Recent Labs  Lab 02/06/18 0544  WBC 3.8  HGB 9.4*  HCT 29.2*  PLT 159    Chemistries  Recent Labs  Lab 02/05/18 1854 02/06/18 0544  NA 136 137  K 3.8 3.6  CL 99* 103  CO2 25 23  GLUCOSE 85 101*  BUN 17 16  CREATININE 0.90 0.85  CALCIUM 8.5* 8.2*  AST 17  --   ALT 9*  --   ALKPHOS 87  --   BILITOT 0.6  --     Microbiology Results   Recent Results (from the past 240 hour(s))  Blood Culture (routine x 2)     Status: None (Preliminary result)   Collection Time: 02/05/18  7:12 PM  Result Value Ref Range Status   Specimen Description BLOOD RAC  Final   Special Requests   Final    BOTTLES DRAWN AEROBIC AND ANAEROBIC Blood Culture adequate volume   Culture   Final    NO GROWTH 2 DAYS Performed at [facility], [address], [place], [place] [address]    Report Status PENDING  Incomplete  Blood Culture (routine x 2)     Status: None (Preliminary result)   Collection Time: 02/05/18  7:12 PM  Result Value Ref Range Status   Specimen Description BLOOD LAC  Final   Special Requests   Final    BOTTLES DRAWN AEROBIC AND ANAEROBIC Blood Culture adequate volume   Culture   Final    NO  GROWTH 2 DAYS Performed at [facility], [address] [place], [place] [address]    Report Status PENDING  Incomplete  Urine culture     Status: None   Collection Time: 02/05/18  7:12 PM  Result Value Ref Range Status   Specimen Description   Final    URINE, RANDOM Performed at [facility], [address] [place], [place] [address]    Special Requests   Final    NONE Performed at [facility], [address] [place], [place] [address]    Culture   Final    NO GROWTH Performed at [facility], [place] [address], [place], [place] [address]    Report Status 02/07/2018 FINAL  Final  MRSA PCR Screening     Status: None   Collection Time: 02/05/18  9:57 PM  Result Value Ref Range Status   MRSA by PCR NEGATIVE NEGATIVE Final    Comment:        The GeneXpert MRSA Assay (FDA approved for NASAL specimens only), is one component of a comprehensive MRSA colonization surveillance program. It is not intended to diagnose MRSA infection nor to guide or monitor treatment for MRSA infections. Performed at [facility], [address] [place],  [address]     RADIOLOGY:  Dg Chest Port 1 View  Result Date: 02/05/2018 CLINICAL DATA:  Fever and weakness EXAM: PORTABLE CHEST 1 VIEW COMPARISON:  September 25, 2017 FINDINGS: There is focal  consolidation in the medial left base. Lungs elsewhere are clear. Heart is borderline enlarged with pulmonary vascularity within normal limits. No adenopathy. There is an old healed right ninth rib fracture. IMPRESSION: Consolidation medial left base, felt to represent pneumonia. Lungs elsewhere clear. Stable mild cardiomegaly. Followup PA and lateral chest radiographs recommended in 3-4 weeks following trial of antibiotic therapy to ensure resolution and exclude underlying malignancy. Electronically Signed   By: Lowella Grip III M.D.   On: 02/05/2018 19:32     Management plans discussed with the  patient, family and they are in agreement.  CODE STATUS:     Code Status Orders  (From admission, onward)        Start     Ordered   02/05/18 2024  Full code  Continuous     02/05/18 2025    Code Status History    Date Active Date Inactive Code Status Order ID Comments User Context   04/29/2017 13:14 05/02/2017 21:48 Full Code [id number]  Epifanio Lesches, MD ED   12/23/2016 17:45 12/25/2016 18:49 Full Code [id number]  Theodoro Grist, MD ED   11/06/2016 09:00 11/07/2016 18:53 Full Code [id number]  Max Sane, MD Inpatient   11/05/2016 14:01 11/06/2016 09:00 Partial Code [id number]  Bettey Costa, MD Inpatient   06/03/2016 02:53 06/05/2016 19:45 Full Code [id number]  Quintella Baton, MD Inpatient   11/19/2015 12:18 11/24/2015 16:24 Full Code [id number]  Demetrios Loll, MD Inpatient   11/09/2015 23:02 11/13/2015 18:51 Full Code [id number]  Lytle Butte, MD ED   10/17/2015 22:26 10/22/2015 13:03 Full Code [id number]  Fritzi Mandes, MD Inpatient   10/09/2015 17:38 10/12/2015 19:34 Full Code [id number]  Demetrios Loll, MD Inpatient   08/01/2015 00:08 08/03/2015 14:54 Full Code [id number]  Lance Coon, MD Inpatient    Advance Directive Documentation     Most Recent Value  Type of Advance Directive  Healthcare Power of [place], Living will  Pre-existing out of facility DNR order (yellow form or pink MOST form)  No data  "MOST" Form in Place?  No data      TOTAL TIME TAKING CARE OF THIS PATIENT: *40* minutes.    Fritzi Mandes M.D on 02/07/2018 at 2:55 PM  Between 7am to 6pm - Pager - [phone] After 6pm go to [email or web address] - password [email or web address] [facility]  Office  [phone]  CC: Primary care physician; Cheryl Sons, MD

## 2018-02-08 ENCOUNTER — Observation Stay
Admission: EM | Admit: 2018-02-08 | Discharge: 2018-02-09 | Disposition: A | Discharge status: Home Health | Payer: Medicare HMO | Attending: Internal Medicine | Admitting: Internal Medicine

## 2018-02-08 ENCOUNTER — Other Ambulatory Visit: Payer: Self-pay

## 2018-02-08 ENCOUNTER — Emergency Department: Payer: Medicare HMO

## 2018-02-08 ENCOUNTER — Encounter: Payer: Self-pay | Admitting: Emergency Medicine

## 2018-02-08 DIAGNOSIS — J440 Chronic obstructive pulmonary disease with acute lower respiratory infection: Secondary | ICD-10-CM | POA: Insufficient documentation

## 2018-02-08 DIAGNOSIS — Z882 Allergy status to sulfonamides status: Secondary | ICD-10-CM | POA: Insufficient documentation

## 2018-02-08 DIAGNOSIS — M6281 Muscle weakness (generalized): Principal | ICD-10-CM | POA: Insufficient documentation

## 2018-02-08 DIAGNOSIS — F17210 Nicotine dependence, cigarettes, uncomplicated: Secondary | ICD-10-CM | POA: Insufficient documentation

## 2018-02-08 DIAGNOSIS — I739 Peripheral vascular disease, unspecified: Secondary | ICD-10-CM | POA: Diagnosis not present

## 2018-02-08 DIAGNOSIS — R531 Weakness: Secondary | ICD-10-CM | POA: Diagnosis not present

## 2018-02-08 DIAGNOSIS — Z7952 Long term (current) use of systemic steroids: Secondary | ICD-10-CM | POA: Diagnosis not present

## 2018-02-08 DIAGNOSIS — Z7902 Long term (current) use of antithrombotics/antiplatelets: Secondary | ICD-10-CM | POA: Diagnosis not present

## 2018-02-08 DIAGNOSIS — Z7951 Long term (current) use of inhaled steroids: Secondary | ICD-10-CM | POA: Diagnosis not present

## 2018-02-08 DIAGNOSIS — I110 Hypertensive heart disease with heart failure: Secondary | ICD-10-CM | POA: Diagnosis not present

## 2018-02-08 DIAGNOSIS — J1000 Influenza due to other identified influenza virus with unspecified type of pneumonia: Secondary | ICD-10-CM | POA: Insufficient documentation

## 2018-02-08 DIAGNOSIS — E876 Hypokalemia: Secondary | ICD-10-CM | POA: Diagnosis not present

## 2018-02-08 DIAGNOSIS — J189 Pneumonia, unspecified organism: Secondary | ICD-10-CM | POA: Diagnosis not present

## 2018-02-08 DIAGNOSIS — Z79890 Hormone replacement therapy (postmenopausal): Secondary | ICD-10-CM | POA: Diagnosis not present

## 2018-02-08 DIAGNOSIS — R197 Diarrhea, unspecified: Secondary | ICD-10-CM | POA: Insufficient documentation

## 2018-02-08 DIAGNOSIS — Z7983 Long term (current) use of bisphosphonates: Secondary | ICD-10-CM | POA: Insufficient documentation

## 2018-02-08 DIAGNOSIS — J449 Chronic obstructive pulmonary disease, unspecified: Secondary | ICD-10-CM | POA: Diagnosis not present

## 2018-02-08 DIAGNOSIS — R112 Nausea with vomiting, unspecified: Secondary | ICD-10-CM | POA: Insufficient documentation

## 2018-02-08 DIAGNOSIS — F0390 Unspecified dementia without behavioral disturbance: Secondary | ICD-10-CM | POA: Insufficient documentation

## 2018-02-08 DIAGNOSIS — Z79899 Other long term (current) drug therapy: Secondary | ICD-10-CM | POA: Diagnosis not present

## 2018-02-08 DIAGNOSIS — J9610 Chronic respiratory failure, unspecified whether with hypoxia or hypercapnia: Secondary | ICD-10-CM | POA: Insufficient documentation

## 2018-02-08 DIAGNOSIS — Z886 Allergy status to analgesic agent status: Secondary | ICD-10-CM | POA: Insufficient documentation

## 2018-02-08 DIAGNOSIS — I5032 Chronic diastolic (congestive) heart failure: Secondary | ICD-10-CM | POA: Insufficient documentation

## 2018-02-08 DIAGNOSIS — F419 Anxiety disorder, unspecified: Secondary | ICD-10-CM | POA: Insufficient documentation

## 2018-02-08 DIAGNOSIS — J101 Influenza due to other identified influenza virus with other respiratory manifestations: Secondary | ICD-10-CM | POA: Diagnosis not present

## 2018-02-08 DIAGNOSIS — Z79891 Long term (current) use of opiate analgesic: Secondary | ICD-10-CM | POA: Diagnosis not present

## 2018-02-08 DIAGNOSIS — K219 Gastro-esophageal reflux disease without esophagitis: Secondary | ICD-10-CM | POA: Diagnosis not present

## 2018-02-08 DIAGNOSIS — E860 Dehydration: Secondary | ICD-10-CM | POA: Diagnosis not present

## 2018-02-08 DIAGNOSIS — Z885 Allergy status to narcotic agent status: Secondary | ICD-10-CM | POA: Insufficient documentation

## 2018-02-08 DIAGNOSIS — R918 Other nonspecific abnormal finding of lung field: Secondary | ICD-10-CM | POA: Diagnosis not present

## 2018-02-08 LAB — URINALYSIS, COMPLETE (UACMP) WITH MICROSCOPIC
BILIRUBIN URINE: NEGATIVE
GLUCOSE, UA: NEGATIVE mg/dL
KETONES UR: 80 mg/dL — AB
NITRITE: NEGATIVE
PH: 6 (ref 5.0–8.0)
Protein, ur: NEGATIVE mg/dL
Specific Gravity, Urine: 1.018 (ref 1.005–1.030)

## 2018-02-08 LAB — CBC
HCT: 34.8 % — ABNORMAL LOW (ref 35.0–47.0)
Hemoglobin: 11.3 g/dL — ABNORMAL LOW (ref 12.0–16.0)
MCH: 27.6 pg (ref 26.0–34.0)
MCHC: 32.5 g/dL (ref 32.0–36.0)
MCV: 84.9 fL (ref 80.0–100.0)
PLATELETS: 191 10*3/uL (ref 150–440)
RBC: 4.09 MIL/uL (ref 3.80–5.20)
RDW: 16.6 % — ABNORMAL HIGH (ref 11.5–14.5)
WBC: 7.4 10*3/uL (ref 3.6–11.0)

## 2018-02-08 LAB — COMPREHENSIVE METABOLIC PANEL
ALT: 20 U/L (ref 14–54)
AST: 27 U/L (ref 15–41)
Albumin: 3.4 g/dL — ABNORMAL LOW (ref 3.5–5.0)
Alkaline Phosphatase: 129 U/L — ABNORMAL HIGH (ref 38–126)
Anion gap: 12 (ref 5–15)
BUN: 23 mg/dL — ABNORMAL HIGH (ref 6–20)
CALCIUM: 8.8 mg/dL — AB (ref 8.9–10.3)
CHLORIDE: 102 mmol/L (ref 101–111)
CO2: 26 mmol/L (ref 22–32)
CREATININE: 0.74 mg/dL (ref 0.44–1.00)
GFR calc Af Amer: >60 mL/min (ref >60)
Glucose, Bld: 74 mg/dL (ref 65–99)
Potassium: 3.5 mmol/L (ref 3.5–5.1)
Sodium: 140 mmol/L (ref 135–145)
TOTAL PROTEIN: 7.2 g/dL (ref 6.5–8.1)
Total Bilirubin: 0.7 mg/dL (ref 0.3–1.2)

## 2018-02-08 LAB — LIPASE, BLOOD: LIPASE: 28 U/L (ref 11–51)

## 2018-02-08 MED ORDER — IPRATROPIUM-ALBUTEROL 0.5-2.5 (3) MG/3ML IN SOLN
3.0000 mL | Freq: Once | RESPIRATORY_TRACT | Status: AC
  Administered 2018-02-08: 3 mL via RESPIRATORY_TRACT
  Filled 2018-02-08: qty 3

## 2018-02-08 MED ORDER — DULOXETINE HCL 30 MG PO CPEP
30.0000 mg | ORAL_CAPSULE | Freq: Every day | ORAL | Status: DC
  Administered 2018-02-08 – 2018-02-09 (×2): 30 mg via ORAL
  Filled 2018-02-08 (×2): qty 1

## 2018-02-08 MED ORDER — ONDANSETRON HCL 4 MG/2ML IJ SOLN: 4.0000 mg | Freq: Four times a day (QID) | INTRAMUSCULAR | Status: DC | PRN

## 2018-02-08 MED ORDER — LEVOFLOXACIN IN D5W 250 MG/50ML IV SOLN
250.0000 mg | INTRAVENOUS | Status: DC
  Filled 2018-02-08: qty 50

## 2018-02-08 MED ORDER — IPRATROPIUM-ALBUTEROL 0.5-2.5 (3) MG/3ML IN SOLN
3.0000 mL | RESPIRATORY_TRACT | Status: DC
  Administered 2018-02-08 – 2018-02-09 (×5): 3 mL via RESPIRATORY_TRACT
  Filled 2018-02-08 (×5): qty 3

## 2018-02-08 MED ORDER — PANTOPRAZOLE SODIUM 40 MG PO TBEC
40.0000 mg | DELAYED_RELEASE_TABLET | Freq: Two times a day (BID) | ORAL | Status: DC
Start: 2018-02-08 — End: 2018-02-09
  Administered 2018-02-08 – 2018-02-09 (×2): 40 mg via ORAL
  Filled 2018-02-08 (×2): qty 1

## 2018-02-08 MED ORDER — ALBUTEROL SULFATE (2.5 MG/3ML) 0.083% IN NEBU: 2.5000 mg | INHALATION_SOLUTION | RESPIRATORY_TRACT | Status: DC | PRN

## 2018-02-08 MED ORDER — ACETAMINOPHEN 325 MG PO TABS: 650.0000 mg | ORAL_TABLET | Freq: Four times a day (QID) | ORAL | Status: DC | PRN

## 2018-02-08 MED ORDER — PREGABALIN 75 MG PO CAPS
300.0000 mg | ORAL_CAPSULE | Freq: Two times a day (BID) | ORAL | Status: DC
  Administered 2018-02-08 – 2018-02-09 (×2): 300 mg via ORAL
  Filled 2018-02-08 (×2): qty 4

## 2018-02-08 MED ORDER — DIPHENOXYLATE-ATROPINE 2.5-0.025 MG PO TABS: 2.0000 | ORAL_TABLET | Freq: Four times a day (QID) | ORAL | Status: DC | PRN

## 2018-02-08 MED ORDER — SODIUM CHLORIDE 0.9 % IV BOLUS (SEPSIS)
1000.0000 mL | Freq: Once | INTRAVENOUS | Status: AC
  Administered 2018-02-08: 1000 mL via INTRAVENOUS

## 2018-02-08 MED ORDER — OSELTAMIVIR PHOSPHATE 30 MG PO CAPS
30.0000 mg | ORAL_CAPSULE | Freq: Two times a day (BID) | ORAL | Status: DC
  Administered 2018-02-08 – 2018-02-09 (×2): 30 mg via ORAL
  Filled 2018-02-08 (×3): qty 1

## 2018-02-08 MED ORDER — TIOTROPIUM BROMIDE MONOHYDRATE 18 MCG IN CAPS
18.0000 ug | ORAL_CAPSULE | Freq: Every day | RESPIRATORY_TRACT | Status: DC
  Administered 2018-02-08 – 2018-02-09 (×2): 18 ug via RESPIRATORY_TRACT
  Filled 2018-02-08: qty 5

## 2018-02-08 MED ORDER — HEPARIN SODIUM (PORCINE) 5000 UNIT/ML IJ SOLN
5000.0000 [IU] | Freq: Three times a day (TID) | INTRAMUSCULAR | Status: DC
  Administered 2018-02-08 – 2018-02-09 (×3): 5000 [IU] via SUBCUTANEOUS
  Filled 2018-02-08 (×3): qty 1

## 2018-02-08 MED ORDER — SODIUM CHLORIDE 0.9 % IV SOLN
INTRAVENOUS | Status: DC
  Administered 2018-02-08 – 2018-02-09 (×2): via INTRAVENOUS

## 2018-02-08 MED ORDER — ENSURE ENLIVE PO LIQD: 237.0000 mL | Freq: Two times a day (BID) | ORAL | Status: DC

## 2018-02-08 MED ORDER — ALPRAZOLAM 0.5 MG PO TABS
0.5000 mg | ORAL_TABLET | Freq: Three times a day (TID) | ORAL | Status: DC | PRN
  Administered 2018-02-09: 0.5 mg via ORAL
  Filled 2018-02-08: qty 1

## 2018-02-08 MED ORDER — SIMVASTATIN 10 MG PO TABS
10.0000 mg | ORAL_TABLET | Freq: Every day | ORAL | Status: DC
  Administered 2018-02-08: 10 mg via ORAL
  Filled 2018-02-08 (×2): qty 1

## 2018-02-08 MED ORDER — ALENDRONATE SODIUM 70 MG PO TABS: 70.0000 mg | ORAL_TABLET | ORAL | Status: DC

## 2018-02-08 MED ORDER — CLOPIDOGREL BISULFATE 75 MG PO TABS
75.0000 mg | ORAL_TABLET | Freq: Every day | ORAL | Status: DC
  Administered 2018-02-08 – 2018-02-09 (×2): 75 mg via ORAL
  Filled 2018-02-08 (×2): qty 1

## 2018-02-08 MED ORDER — LEVOFLOXACIN IN D5W 250 MG/50ML IV SOLN
250.0000 mg | Freq: Once | INTRAVENOUS | Status: AC
  Administered 2018-02-08: 250 mg via INTRAVENOUS
  Filled 2018-02-08: qty 50

## 2018-02-08 MED ORDER — BENZONATATE 100 MG PO CAPS: 100.0000 mg | ORAL_CAPSULE | Freq: Two times a day (BID) | ORAL | Status: DC | PRN

## 2018-02-08 MED ORDER — MOMETASONE FURO-FORMOTEROL FUM 200-5 MCG/ACT IN AERO
2.0000 | INHALATION_SPRAY | Freq: Two times a day (BID) | RESPIRATORY_TRACT | Status: DC
  Administered 2018-02-08 – 2018-02-09 (×2): 2 via RESPIRATORY_TRACT
  Filled 2018-02-08: qty 8.8

## 2018-02-08 MED ORDER — CYCLOBENZAPRINE HCL 10 MG PO TABS: 5.0000 mg | ORAL_TABLET | Freq: Three times a day (TID) | ORAL | Status: DC | PRN

## 2018-02-08 MED ORDER — DOCUSATE SODIUM 100 MG PO CAPS: 100.0000 mg | ORAL_CAPSULE | Freq: Two times a day (BID) | ORAL | Status: DC | PRN

## 2018-02-08 MED ORDER — CELECOXIB 100 MG PO CAPS
100.0000 mg | ORAL_CAPSULE | Freq: Two times a day (BID) | ORAL | Status: DC | PRN
  Filled 2018-02-08: qty 1

## 2018-02-08 MED ORDER — DONEPEZIL HCL 5 MG PO TABS
10.0000 mg | ORAL_TABLET | Freq: Every day | ORAL | Status: DC
  Administered 2018-02-08: 10 mg via ORAL
  Filled 2018-02-08: qty 1
  Filled 2018-02-08: qty 2

## 2018-02-08 NOTE — NC FL2 (Signed)
[place] LEVEL OF CARE SCREENING TOOL     IDENTIFICATION  Patient Name: Cheryl [name] Birthdate: 1951/07/23 Sex: female Admission Date (Current Location): 02/08/2018  [place] and [place] Number:  Engineering geologist and Address:  [facility], [address] [place], [place] [address]      Provider Number: [id number]  Attending Physician Name and Address:  Merlyn Lot, MD  Relative Name and Phone Number:   Mr Brazeau [phone]    Current Level of Care: Hospital Recommended Level of Care: [place] Prior Approval Number:    Date Approved/Denied:   PASRR Number: [id number]  Discharge Plan: SNF    Current Diagnoses: Patient Active Problem List   Diagnosis Date Noted  . Rib fracture 09/19/2017  . Marijuana use 06/05/2017  . Pneumonia 04/29/2017  . Acute postoperative pain 04/14/2017  . Metabolic encephalopathy 62/37/6283  . Hypokalemia 12/23/2016  . Protein-calorie malnutrition, severe 11/06/2016  . Nausea with vomiting   . Lumbar facet syndrome (Location of Primary Source of Pain) (Bilateral) (L>R) 03/20/2016  . Lumbar spondylosis 03/20/2016  . Opiate use (160 MME/Day) 02/28/2016  . Anemia 02/28/2016  . Osteoarthrosis 12/18/2015  . Long term current use of anticoagulant therapy (Plavix) 12/18/2015  . Long term current use of opiate analgesic 12/04/2015  . Fibromyalgia 12/04/2015  . Dysphagia 11/24/2015  . Acute diastolic CHF (congestive heart failure) ([place]) 11/24/2015  . Leukocytosis 11/24/2015  . Abnormal mammogram of right breast 10/11/2015  . Dilated intrahepatic bile duct 10/11/2015  . Lumbar radicular pain (Bilateral) (L>R) (L4) 09/04/2015  . Chronic low back pain (Location of Primary Source of Pain) (Bilateral) (L>R) 09/04/2015  . Encounter for therapeutic drug level monitoring 09/04/2015  . Uncomplicated opioid dependence ([place]) 09/04/2015  . Chronic pain syndrome 09/04/2015  . Platelet  inhibition due to Plavix 09/04/2015  . COPD (chronic obstructive pulmonary disease) ([place]) 07/31/2015  . Dementia 07/31/2015  . Airway hyperreactivity 07/03/2015  . Back pain, thoracic 07/03/2015  . Excessive falling 07/03/2015  . Alteration in bowel elimination: incontinence 07/03/2015  . Insomnia 07/03/2015  . Decreased testosterone level 07/03/2015  . Leg weakness 07/03/2015  . Menopausal symptom 07/03/2015  . Migraine 07/03/2015  . Neuropathy ([place]) 07/03/2015  . Fecal occult blood test positive 07/03/2015  . OP (osteoporosis) 07/03/2015  . Panic disorder 07/03/2015  . Compulsive tobacco user syndrome 07/03/2015  . Urinary incontinence 07/03/2015  . Weight loss 07/03/2015  . Essential hypertension 06/06/2015  . GERD (gastroesophageal reflux disease) 06/06/2015  . Hyperlipemia 06/06/2015  . Peripheral nerve disease 04/13/2014  . Anxiety 02/10/2014  . Coronary artery disease 02/10/2014  . Peripheral vascular disease ([place]) 02/10/2014  . Vitamin D deficiency 10/16/2009    Orientation RESPIRATION BLADDER Height & Weight     Self, Time, Situation, Place  O2(3 litres) Continent Weight: 99 lb (44.9 kg) Height:  '4\' 10"'$  (147.3 cm)  BEHAVIORAL SYMPTOMS/MOOD NEUROLOGICAL BOWEL NUTRITION STATUS      Continent Diet(Normal)  AMBULATORY STATUS COMMUNICATION OF NEEDS Skin   Supervision Verbally Normal                       Personal Care Assistance Level of Assistance  Bathing, Feeding, Dressing Bathing Assistance: Limited assistance Feeding assistance: Independent Dressing Assistance: Limited assistance     Functional Limitations Info  Sight, Hearing, Speech Sight Info: Adequate Hearing Info: Adequate Speech Info: Adequate    SPECIAL CARE FACTORS FREQUENCY  PT (By licensed PT), OT (By licensed OT)  PT Frequency: x5 OT Frequency: x5            Contractures Contractures Info: Not present    Additional Factors Info  Allergies Code Status Info: Full  code Allergies Info: Percocet Oxycodone-acetaminophen, Aspirin, Codeine, Propoxyphene, Sulfa Antibiotics           Current Medications (02/08/2018):  This is the current hospital active medication list Current Facility-Administered Medications  Medication Dose Route Frequency Provider Last Rate Last Dose  . ipratropium-albuterol (DUONEB) 0.5-2.5 (3) MG/3ML nebulizer solution 3 mL  3 mL Nebulization Once Merlyn Lot, MD      . ipratropium-albuterol (DUONEB) 0.5-2.5 (3) MG/3ML nebulizer solution 3 mL  3 mL Nebulization Once Merlyn Lot, MD      . Levofloxacin [facility]) IVPB 250 mg  250 mg Intravenous Once Merlyn Lot, MD 50 mL/hr at 02/08/18 1553 250 mg at 02/08/18 1553   Current Outpatient Medications  Medication Sig Dispense Refill  . alendronate (FOSAMAX) 70 MG tablet Take 1 tablet (70 mg total) by mouth every 7 (seven) days. Take with a full glass of water on an empty stomach. 4 tablet 11  . clopidogrel (PLAVIX) 75 MG tablet Take 75 mg by mouth daily.    Marland Kitchen donepezil (ARICEPT) 10 MG tablet Take 10 mg by mouth at bedtime.    . DULoxetine (CYMBALTA) 30 MG capsule Take 1 capsule by mouth daily.    Marland Kitchen estropipate (OGEN) 0.75 MG tablet Take 1 tablet (0.75 mg total) by mouth every other day. 30 tablet 3  . [START ON 04/22/2018] fentaNYL (DURAGESIC - DOSED MCG/HR) 50 MCG/HR Place 1 patch (50 mcg total) onto the skin every 3 (three) days. 10 patch 0  . Fluticasone-Salmeterol (ADVAIR DISKUS) 250-50 MCG/DOSE AEPB inhale 1 dose by mouth twice a day 60 each 5  . furosemide (LASIX) 40 MG tablet Take 40 mg by mouth every other day.     . levofloxacin (LEVAQUIN) 250 MG tablet Take 1 tablet (250 mg total) by mouth daily for 5 days. 5 tablet 0  . LYRICA 300 MG capsule Take 300 mg by mouth 2 (two) times daily.  0  . oseltamivir (TAMIFLU) 30 MG capsule Take 1 capsule (30 mg total) by mouth 2 (two) times daily for 3 days. 6 capsule 0  . pantoprazole (PROTONIX) 40 MG tablet Take 1 tablet (40  mg total) by mouth 2 (two) times daily. 60 tablet 12  . potassium chloride (K-DUR) 10 MEQ tablet Take 1 tablet by mouth daily.    . simvastatin (ZOCOR) 10 MG tablet Take 10 mg by mouth at bedtime. Reported on 12/04/2015    . tiotropium (SPIRIVA HANDIHALER) 18 MCG inhalation capsule inhale the contents of one capsule in the handihaler once daily 30 capsule 12  . [START ON 02/21/2018] traMADol (ULTRAM) 50 MG tablet Take 2 tablets (100 mg total) by mouth every 6 (six) hours as needed for moderate pain or severe pain. 240 tablet 2  . acetaminophen (TYLENOL) 325 MG tablet Take 2 tablets (650 mg total) by mouth every 6 (six) hours as needed for mild pain (or Fever >/= 101). 30 tablet 0  . albuterol (PROVENTIL HFA;VENTOLIN HFA) 108 (90 Base) MCG/ACT inhaler Inhale 2 puffs into the lungs every 4 (four) hours as needed for wheezing or shortness of breath. 18 g 3  . albuterol (PROVENTIL) (2.5 MG/3ML) 0.083% nebulizer solution Take 3 mLs (2.5 mg total) by nebulization every 4 (four) hours as needed for wheezing. 50 vial 12  . ALPRAZolam (XANAX) 0.5  MG tablet take 1 tablet by mouth three times a day if needed (Patient not taking: Reported on 02/08/2018) 60 tablet 5  . benzonatate (TESSALON) 100 MG capsule Take 1 capsule (100 mg total) by mouth 2 (two) times daily as needed for cough. (Patient not taking: Reported on 02/05/2018) 20 capsule 1  . celecoxib (CELEBREX) 100 MG capsule Take 1 capsule (100 mg total) by mouth 2 (two) times daily as needed. 60 capsule 5  . cyclobenzaprine (FLEXERIL) 5 MG tablet Take 1 tablet (5 mg total) by mouth 3 (three) times daily as needed for muscle spasms. 30 tablet 5  . diphenoxylate-atropine (LOMOTIL) 2.5-0.025 MG per tablet Take 2 tablets by mouth 4 (four) times daily as needed for diarrhea or loose stools. Reported on 03/11/2016    . mupirocin ointment (BACTROBAN) 2 % Place 1 application into the nose 2 (two) times daily. (Patient not taking: Reported on 02/08/2018) 30 g 2  . NARCAN 4  MG/0.1ML LIQD nasal spray kit Place 0.4 mg into the nose once.     . predniSONE (DELTASONE) 10 MG tablet To 50 mg daily.  Taper by 10 mg daily then stop 15 tablet 0  . silver sulfADIAZINE (SILVADENE) 1 % cream Apply topically daily. 50 g 3  . SUMAtriptan (IMITREX) 25 MG tablet Take 1 tablet (25 mg total) by mouth as needed for migraine. May repeat in 2 hours if headache persists or recurs. (Patient not taking: Reported on 02/08/2018) 10 tablet 5  . triamcinolone ointment (KENALOG) 0.5 % Apply to lesions on hands twice a day as needed 30 g 1     Discharge Medications: Please see discharge summary for a list of discharge medications.  Relevant Imaging Results:  Relevant Lab Results:   Additional Information SSN:  [id number]  Joana Reamer, [place]

## 2018-02-08 NOTE — ED Notes (Signed)
Pt no longer has her bp cuff on - pt states it hurts.

## 2018-02-08 NOTE — Progress Notes (Signed)
Patient is oriented x4 but has mild dementia she needs some assistance with ADL's Patient has [employer] medicare and medicaid. If patient is not admitted- the family will call home health back and rally around her. They would like her to go to [facility]. It was explained that [facility]. LCSW consulted with EDP and he will place patient in OBS as she is dehydrated. LCSW will complete assessment and Fl2 and send information to [facility].   Delta Air LinesClaudine Miguel Christiana LCSW [phone]

## 2018-02-08 NOTE — Progress Notes (Signed)

## 2018-02-08 NOTE — H&P (Signed)
[facility] NAME: Cheryl [name]    MR#:  [id number]  DATE OF BIRTH:  01/01/1951  DATE OF ADMISSION:  02/08/2018  PRIMARY CARE PHYSICIAN: Birdie Sons, MD   REQUESTING/REFERRING PHYSICIAN: Quentin Cornwall  CHIEF COMPLAINT:   Chief Complaint  Patient presents with  . Emesis  . Abdominal Pain    HISTORY OF PRESENT ILLNESS: Cheryl [name]  is a 67 y.o. female with a known history of Aspirations, COPD, Ch pain, HLD, Htn, PVD- was admitted 2 days ago for Flu + pneumonia. Due to weakness- PT had suggested rehab but pt chose to go home with home health,  RN. Since yesterday, at home, pt was very weak, could not get up, Had nausea and could not take her oral pills.  ER physician has called Social worker for placement, but as per her- she will not be able to get authorization from insurance today so we are keeping pt in hospital for likely placement tomorrow.  PAST MEDICAL HISTORY:   Past Medical History:  Diagnosis Date  . Allergy   . Anxiety   . Arthritis   . Aspiration pneumonitis ([place]) 11/24/2015  . Asthma   . Chronic pain   . COPD (chronic obstructive pulmonary disease) ([place])   . Coronary artery disease   . DVT (deep venous thrombosis) ([place])   . GERD (gastroesophageal reflux disease)   . Headache   . Hyperlipidemia   . Hypertension   . Kyphoscoliosis deformity of spine   . Migraines   . Neuropathy 2010  . Osteoporosis   . Oxygen deficiency   . Peripheral vascular disease ([place])   . Pneumonia   . Pneumonia 11/19/2015  . Pneumonia 10/2016  . Pneumonia    aspiration 2018- 4 times in last year  . Vitamin D deficiency     PAST SURGICAL HISTORY:  Past Surgical History:  Procedure Laterality Date  . ABDOMINAL HYSTERECTOMY  1975   Bilaterl Oophorectomy; [place] to IUD infection  . abdomnal aortic stent  05/30/2008   Dr. Quay Burow  . APPENDECTOMY    . APPENDECTOMY    . cardiac catherization  10/31/2009  . CERVICAL FUSION  C5 -  6/C6-7  . CHOLECYSTECTOMY  1972  . COLONOSCOPY WITH PROPOFOL N/A 07/27/2015   Procedure: COLONOSCOPY WITH PROPOFOL;  Surgeon: Hulen Luster, MD;  Location[facility] END[facility]COPY;  Service: Gastroenterology;  Laterality: N/A;  . ESOPHAGOGASTRODUODENOSCOPY (EGD) WITH PROPOFOL N/A 07/27/2015   Procedure: ESOPHAGOGASTRODUODENOSCOPY (EGD) WITH PROPOFOL;  Surgeon: Hulen Luster, MD;  Location: [facility] ENDOSCOPY;  Service: Gastroenterology;  Laterality: N/A;  . FOOT SURGERY Bilateral    5-6 years per patient  . SPINE SURGERY      SOCIAL HISTORY:  Social History   Tobacco Use  . Smoking status: Current Every Day Smoker    Packs/day: 0.50    Years: 50.00    Pack years: 25.00    Types: Cigarettes  . Smokeless tobacco: Never Used  . Tobacco comment: Previously smoked 2 ppd  Substance Use Topics  . Alcohol use: No    Alcohol/week: 0.0 oz    FAMILY HISTORY:  Family History  Problem Relation Age of Onset  . Cancer Mother   . Arthritis Mother   . Heart disease Mother   . Diabetes Mother        mellitus, type 2  . Heart disease Father   . Diabetes Sister   . Cancer Brother   . Cancer Brother  lung  . Diabetes Brother     DRUG ALLERGIES:  Allergies  Allergen Reactions  . Percocet [Oxycodone-Acetaminophen] Hives and Rash  . Aspirin Nausea And Vomiting and Other (See Comments)    Reaction:  GI upset   . Codeine Nausea And Vomiting, Other (See Comments) and Nausea Only    Reaction:  GI upset   . Propoxyphene Other (See Comments) and Nausea Only    GI upset Reaction:  GI upset   . Sulfa Antibiotics Rash and Other (See Comments)    Reaction:  GI upset     REVIEW OF SYSTEMS:   CONSTITUTIONAL: No fever, positive for fatigue or weakness.  EYES: No blurred or double vision.  EARS, NOSE, AND THROAT: No tinnitus or ear pain.  RESPIRATORY: No cough, shortness of breath, wheezing or hemoptysis.  CARDIOVASCULAR: No chest pain, orthopnea, edema.  GASTROINTESTINAL: No nausea, vomiting, diarrhea or  abdominal pain.  GENITOURINARY: No dysuria, hematuria.  ENDOCRINE: No polyuria, nocturia,  HEMATOLOGY: No anemia, easy bruising or bleeding SKIN: No rash or lesion. MUSCULOSKELETAL: No joint pain or arthritis.   NEUROLOGIC: No tingling, numbness, weakness.  PSYCHIATRY: No anxiety or depression.   MEDICATIONS AT HOME:  Prior to Admission medications   Medication Sig Start Date End Date Taking? Authorizing Provider  alendronate (FOSAMAX) 70 MG tablet Take 1 tablet (70 mg total) by mouth every 7 (seven) days. Take with a full glass of water on an empty stomach. 01/23/18  Yes Birdie Sons, MD  clopidogrel (PLAVIX) 75 MG tablet Take 75 mg by mouth daily.   Yes [provider]  donepezil (ARICEPT) 10 MG tablet Take 10 mg by mouth at bedtime.   Yes [provider]  DULoxetine (CYMBALTA) 30 MG capsule Take 1 capsule by mouth daily. 01/26/18  Yes [provider]  estropipate (OGEN) 0.75 MG tablet Take 1 tablet (0.75 mg total) by mouth every other day. 01/23/18  Yes Birdie Sons, MD  fentaNYL (DURAGESIC - DOSED MCG/HR) 50 MCG/HR Place 1 patch (50 mcg total) onto the skin every 3 (three) days. 04/22/18 05/22/18 Yes Vevelyn Francois, NP  Fluticasone-Salmeterol (ADVAIR DISKUS) 250-50 MCG/DOSE AEPB inhale 1 dose by mouth twice a day 01/23/18  Yes Birdie Sons, MD  furosemide (LASIX) 40 MG tablet Take 40 mg by mouth every other day.  02/16/17  Yes [provider]  levofloxacin (LEVAQUIN) 250 MG tablet Take 1 tablet (250 mg total) by mouth daily for 5 days. 02/07/18 02/12/18 Yes Fritzi Mandes, MD  LYRICA 300 MG capsule Take 300 mg by mouth 2 (two) times daily. 04/12/17  Yes [provider]  oseltamivir (TAMIFLU) 30 MG capsule Take 1 capsule (30 mg total) by mouth 2 (two) times daily for 3 days. 02/07/18 02/10/18 Yes Fritzi Mandes, MD  pantoprazole (PROTONIX) 40 MG tablet Take 1 tablet (40 mg total) by mouth 2 (two) times daily. 01/23/18  Yes Birdie Sons, MD  potassium  chloride (K-DUR) 10 MEQ tablet Take 1 tablet by mouth daily. 01/22/18  Yes [provider]  simvastatin (ZOCOR) 10 MG tablet Take 10 mg by mouth at bedtime. Reported on 12/04/2015   Yes [provider]  tiotropium (SPIRIVA HANDIHALER) 18 MCG inhalation capsule inhale the contents of one capsule in the handihaler once daily 01/23/18  Yes Fisher, Kirstie Peri, MD  traMADol (ULTRAM) 50 MG tablet Take 2 tablets (100 mg total) by mouth every 6 (six) hours as needed for moderate pain or severe pain. 02/21/18 05/22/18 Yes Edison Pace,  Diona Foley, NP  acetaminophen (TYLENOL) 325 MG tablet Take 2 tablets (650 mg total) by mouth every 6 (six) hours as needed for mild pain (or Fever >/= 101). 12/25/16   Gladstone Lighter, MD  albuterol (PROVENTIL HFA;VENTOLIN HFA) 108 (90 Base) MCG/ACT inhaler Inhale 2 puffs into the lungs every 4 (four) hours as needed for wheezing or shortness of breath. 01/23/18   Birdie Sons, MD  albuterol (PROVENTIL) (2.5 MG/3ML) 0.083% nebulizer solution Take 3 mLs (2.5 mg total) by nebulization every 4 (four) hours as needed for wheezing. 12/24/17   Birdie Sons, MD  ALPRAZolam Duanne Moron) 0.5 MG tablet take 1 tablet by mouth three times a day if needed Patient not taking: Reported on 02/08/2018 01/23/18   Birdie Sons, MD  benzonatate (TESSALON) 100 MG capsule Take 1 capsule (100 mg total) by mouth 2 (two) times daily as needed for cough. Patient not taking: Reported on 02/05/2018 11/24/17   Birdie Sons, MD  celecoxib (CELEBREX) 100 MG capsule Take 1 capsule (100 mg total) by mouth 2 (two) times daily as needed. 01/23/18   Birdie Sons, MD  cyclobenzaprine (FLEXERIL) 5 MG tablet Take 1 tablet (5 mg total) by mouth 3 (three) times daily as needed for muscle spasms. 02/02/18   Birdie Sons, MD  diphenoxylate-atropine (LOMOTIL) 2.5-0.025 MG per tablet Take 2 tablets by mouth 4 (four) times daily as needed for diarrhea or loose stools. Reported on 03/11/2016    [provider]  mupirocin ointment (BACTROBAN) 2 % Place 1 application into the nose 2 (two) times daily. Patient not taking: Reported on 02/08/2018 06/03/17   Birdie Sons, MD  [facility] 4 MG/0.1ML LIQD nasal spray kit Place 0.4 mg into the nose once.  09/11/17   [provider]  predniSONE (DELTASONE) 10 MG tablet To 50 mg daily.  Taper by 10 mg daily then stop 02/08/18   Fritzi Mandes, MD  silver sulfADIAZINE (SILVADENE) 1 % cream Apply topically daily. 01/26/18   Birdie Sons, MD  SUMAtriptan (IMITREX) 25 MG tablet Take 1 tablet (25 mg total) by mouth as needed for migraine. May repeat in 2 hours if headache persists or recurs. Patient not taking: Reported on 02/08/2018 12/23/17   Birdie Sons, MD  triamcinolone ointment (KENALOG) 0.5 % Apply to lesions on hands twice a day as needed 10/24/16   Birdie Sons, MD      PHYSICAL EXAMINATION:   VITAL SIGNS: Blood pressure (!) 175/63, pulse 71, temperature 99.2 F (37.3 C), temperature source Oral, resp. rate 18, height _0  (1.473 m), weight 44.9 kg (99 lb), SpO2 96 %.  GENERAL:  67 y.o.-year-old very skinny patient lying in the bed with no acute distress.  EYES: Pupils equal, round, reactive to light and accommodation. No scleral icterus. Extraocular muscles intact.  HEENT: Head atraumatic, normocephalic. Oropharynx and nasopharynx clear.  NECK:  Supple, no jugular venous distention. No thyroid enlargement, no tenderness.  LUNGS: Normal breath sounds bilaterally, no wheezing, some crepitation. No use of accessory muscles of respiration. On nasal canula oxygen. CARDIOVASCULAR: S1, S2 normal. No murmurs, rubs, or gallops.  ABDOMEN: Soft, nontender, nondistended. Bowel sounds present. No organomegaly or mass.  EXTREMITIES: No pedal edema, cyanosis, or clubbing.  NEUROLOGIC: Cranial nerves II through XII are intact. Muscle strength 3/5 in all extremities. Sensation intact. Gait not checked.  PSYCHIATRIC: The patient is alert and oriented x  3.  SKIN: No obvious rash, lesion, or ulcer.   LABORATORY  PANEL:   CBC Recent Labs  Lab 02/05/18 1854 02/06/18 0544 02/08/18 1338  WBC 5.7 3.8 7.4  HGB 9.3* 9.4* 11.3*  HCT 28.6* 29.2* 34.8*  PLT 185 159 191  MCV 84.5 84.6 84.9  MCH 27.6 27.2 27.6  MCHC 32.7 32.1 32.5  RDW 16.3* 16.3* 16.6*  LYMPHSABS 0.3*  --   --   MONOABS 0.4  --   --   EOSABS 0.0  --   --   BASOSABS 0.0  --   --    ------------------------------------------------------------------------------------------------------------------  Chemistries  Recent Labs  Lab 02/05/18 1854 02/06/18 0544 02/08/18 1338  NA 136 137 140  K 3.8 3.6 3.5  CL 99* 103 102  CO2 _0 GLUCOSE 85 101* 74  BUN 17 16 23*  CREATININE 0.90 0.85 0.74  CALCIUM 8.5* 8.2* 8.8*  AST 17  --  27  ALT 9*  --  20  ALKPHOS 87  --  129*  BILITOT 0.6  --  0.7   ------------------------------------------------------------------------------------------------------------------ estimated creatinine clearance is 43.5 mL/min (by C-G formula based on SCr of 0.74 mg/dL). ------------------------------------------------------------------------------------------------------------------ No results for input(s): TSH, T4TOTAL, T3FREE, THYROIDAB in the last 72 hours.  Invalid input(s): FREET3   Coagulation profile Recent Labs  Lab 02/05/18 1854  INR 0.92   ------------------------------------------------------------------------------------------------------------------- No results for input(s): DDIMER in the last 72 hours. -------------------------------------------------------------------------------------------------------------------  Cardiac Enzymes Recent Labs  Lab 02/05/18 1854  TROPONINI <0.03   ------------------------------------------------------------------------------------------------------------------ Invalid input(s):  POCBNP  ---------------------------------------------------------------------------------------------------------------  Urinalysis    Component Value Date/Time   COLORURINE YELLOW (A) 02/08/2018 1724   APPEARANCEUR HAZY (A) 02/08/2018 1724   APPEARANCEUR Clear 08/19/2014 2200   LABSPEC 1.018 02/08/2018 1724   LABSPEC 1.025 08/19/2014 2200   PHURINE 6.0 02/08/2018 1724   GLUCOSEU NEGATIVE 02/08/2018 1724   GLUCOSEU Negative 08/19/2014 2200   HGBUR SMALL (A) 02/08/2018 1724   BILIRUBINUR NEGATIVE 02/08/2018 1724   BILIRUBINUR Negative 08/19/2014 2200   KETONESUR 80 (A) 02/08/2018 1724   PROTEINUR NEGATIVE 02/08/2018 1724   NITRITE NEGATIVE 02/08/2018 1724   LEUKOCYTESUR LARGE (A) 02/08/2018 1724   LEUKOCYTESUR Negative 08/19/2014 2200     RADIOLOGY: Dg Chest 2 View  Result Date: 02/08/2018 CLINICAL DATA:  Abdominal pain and emesis. EXAM: CHEST - 2 VIEW COMPARISON:  February 05, 2018 FINDINGS: Healed right rib fracture. Probable hiatal hernia. The heart, hila, and mediastinum are stable. Mild opacity seen posteriorly on the lateral view suggests possible infiltrate. The patient does report recent pneumonia. IMPRESSION: Mild opacity posteriorly on the lateral view suggest a small amount of residual infiltrate in the left base. Recommend follow-up to complete resolution. Electronically Signed   By: Dorise Bullion III M.D   On: 02/08/2018 16:07    EKG: Orders placed or performed during the hospital encounter of 02/05/18  . ED EKG 12-Lead  . ED EKG 12-Lead  . EKG 12-Lead  . EKG 12-Lead    IMPRESSION AND PLAN:  * generalized weakness   Due to PNA and Influenza   IV fluids, Monitor.   Need Placement, social worker to help.  * Pneumonia    Finish 4 more days of levaquin, give IV now due to nausea.  * Influenza A   Tamiflu for 2 more days.  * nausea   IV zofran   Ensure TID.  * COPD    Was d/c on tapering steroids.   No wheezing now , will stop steroids and keep on  duoneb.  * PVD  Cont ASA, Plavix.  * Dehydration   Clinically dehydrated, IV fluids, hold lasix.     All the records are reviewed and case discussed with ED provider. Management plans discussed with the patient, family and they are in agreement.  CODE STATUS: Full. Code Status History    Date Active Date Inactive Code Status Order ID Comments User Context   02/05/2018 20:25 02/07/2018 21:19 Full Code [id number]  Hillary Bow, MD ED   04/29/2017 13:14 05/02/2017 21:48 Full Code [id number]  Epifanio Lesches, MD ED   12/23/2016 17:45 12/25/2016 18:49 Full Code [id number]  Theodoro Grist, MD ED   11/06/2016 09:00 11/07/2016 18:53 Full Code [id number]  Max Sane, MD Inpatient   11/05/2016 14:01 11/06/2016 09:00 Partial Code [id number]  Bettey Costa, MD Inpatient   06/03/2016 02:53 06/05/2016 19:45 Full Code [id number]  Quintella Baton, MD Inpatient   11/19/2015 12:18 11/24/2015 16:24 Full Code [id number]  Demetrios Loll, MD Inpatient   11/09/2015 23:02 11/13/2015 18:51 Full Code [id number]  Lytle Butte, MD ED   10/17/2015 22:26 10/22/2015 13:03 Full Code [id number]  Fritzi Mandes, MD Inpatient   10/09/2015 17:38 10/12/2015 19:34 Full Code [id number]  Demetrios Loll, MD Inpatient   08/01/2015 00:08 08/03/2015 14:54 Full Code [id number]  Lance Coon, MD Inpatient    Advance Directive Documentation     Most Recent Value  Type of Advance Directive  Healthcare Power of [place], Living will  Pre-existing out of facility DNR order (yellow form or pink MOST form)  No data  "MOST" Form in Place?  No data       TOTAL TIME TAKING CARE OF THIS PATIENT: 45 minutes.    Vaughan Basta M.D on 02/08/2018   Between 7am to 6pm - Pager - [phone]  After 6pm go to [email or web address] - password [email or web address] [place] [facility]  Office  [phone]  CC: Primary care physician; Birdie Sons, MD   Note: This dictation was prepared with Dragon dictation along with smaller phrase technology. Any  transcriptional errors that result from this process are unintentional.

## 2018-02-08 NOTE — ED Provider Notes (Signed)
[facility] Emergency Department Provider Note    None    (approximate)  I have reviewed the triage vital signs and the nursing notes.   HISTORY  Chief Complaint Emesis and Abdominal Pain    HPI Cheryl [name] is a 67 y.o. female presents 1 day after discharge from the hospital after being admitted for COPD exacerbation with flu and concern for community acquired pneumonia discharged on Levaquin were presents with nausea vomiting epigastric pain diarrhea and generalized malaise.  Patient not tolerating oral hydration or taking her medications.  Has been having to wear 3 L nasal cannula chronically in the clock.  Unable to walk due to diffuse weakness.  Past Medical History:  Diagnosis Date  . Allergy   . Anxiety   . Arthritis   . Aspiration pneumonitis ([place]) 11/24/2015  . Asthma   . Chronic pain   . COPD (chronic obstructive pulmonary disease) ([place])   . Coronary artery disease   . DVT (deep venous thrombosis) ([place])   . GERD (gastroesophageal reflux disease)   . Headache   . Hyperlipidemia   . Hypertension   . Kyphoscoliosis deformity of spine   . Migraines   . Neuropathy 2010  . Osteoporosis   . Oxygen deficiency   . Peripheral vascular disease ([place])   . Pneumonia   . Pneumonia 11/19/2015  . Pneumonia 10/2016  . Pneumonia    aspiration 2018- 4 times in last year  . Vitamin D deficiency    Family History  Problem Relation Age of Onset  . Cancer Mother   . Arthritis Mother   . Heart disease Mother   . Diabetes Mother        mellitus, type 2  . Heart disease Father   . Diabetes Sister   . Cancer Brother   . Cancer Brother        lung  . Diabetes Brother    Past Surgical History:  Procedure Laterality Date  . ABDOMINAL HYSTERECTOMY  1975   Bilaterl Oophorectomy; [place] to IUD infection  . abdomnal aortic stent  05/30/2008   Dr. Quay Burow  . APPENDECTOMY    . APPENDECTOMY    . cardiac catherization  10/31/2009  . CERVICAL  FUSION  C5 - 6/C6-7  . CHOLECYSTECTOMY  1972  . COLONOSCOPY WITH PROPOFOL N/A 07/27/2015   Procedure: COLONOSCOPY WITH PROPOFOL;  Surgeon: Hulen Luster, MD;  Location: [facility] ENDOSCOPY;  Service: Gastroenterology;  Laterality: N/A;  . ESOPHAGOGASTRODUODENOSCOPY (EGD) WITH PROPOFOL N/A 07/27/2015   Procedure: ESOPHAGOGASTRODUODENOSCOPY (EGD) WITH PROPOFOL;  Surgeon: Hulen Luster, MD;  Location: [facility] ENDOSCOPY;  Service: Gastroenterology;  Laterality: N/A;  . FOOT SURGERY Bilateral    5-6 years per patient  . SPINE SURGERY     Patient Active Problem List   Diagnosis Date Noted  . Rib fracture 09/19/2017  . Marijuana use 06/05/2017  . Pneumonia 04/29/2017  . Acute postoperative pain 04/14/2017  . Metabolic encephalopathy 36/64/4034  . Hypokalemia 12/23/2016  . Protein-calorie malnutrition, severe 11/06/2016  . Nausea with vomiting   . Lumbar facet syndrome (Location of Primary Source of Pain) (Bilateral) (L>R) 03/20/2016  . Lumbar spondylosis 03/20/2016  . Opiate use (160 MME/Day) 02/28/2016  . Anemia 02/28/2016  . Osteoarthrosis 12/18/2015  . Long term current use of anticoagulant therapy (Plavix) 12/18/2015  . Long term current use of opiate analgesic 12/04/2015  . Fibromyalgia 12/04/2015  . Dysphagia 11/24/2015  . Acute diastolic CHF (congestive heart failure) ([place]) 11/24/2015  .  Leukocytosis 11/24/2015  . Abnormal mammogram of right breast 10/11/2015  . Dilated intrahepatic bile duct 10/11/2015  . Lumbar radicular pain (Bilateral) (L>R) (L4) 09/04/2015  . Chronic low back pain (Location of Primary Source of Pain) (Bilateral) (L>R) 09/04/2015  . Encounter for therapeutic drug level monitoring 09/04/2015  . Uncomplicated opioid dependence ([place]) 09/04/2015  . Chronic pain syndrome 09/04/2015  . Platelet inhibition due to Plavix 09/04/2015  . COPD (chronic obstructive pulmonary disease) ([place]) 07/31/2015  . Dementia 07/31/2015  . Airway hyperreactivity 07/03/2015  . Back pain, thoracic  07/03/2015  . Excessive falling 07/03/2015  . Alteration in bowel elimination: incontinence 07/03/2015  . Insomnia 07/03/2015  . Decreased testosterone level 07/03/2015  . Leg weakness 07/03/2015  . Menopausal symptom 07/03/2015  . Migraine 07/03/2015  . Neuropathy ([place]) 07/03/2015  . Fecal occult blood test positive 07/03/2015  . OP (osteoporosis) 07/03/2015  . Panic disorder 07/03/2015  . Compulsive tobacco user syndrome 07/03/2015  . Urinary incontinence 07/03/2015  . Weight loss 07/03/2015  . Essential hypertension 06/06/2015  . GERD (gastroesophageal reflux disease) 06/06/2015  . Hyperlipemia 06/06/2015  . Peripheral nerve disease 04/13/2014  . Anxiety 02/10/2014  . Coronary artery disease 02/10/2014  . Peripheral vascular disease ([place]) 02/10/2014  . Vitamin D deficiency 10/16/2009      Prior to Admission medications   Medication Sig Start Date End Date Taking? Authorizing Provider  acetaminophen (TYLENOL) 325 MG tablet Take 2 tablets (650 mg total) by mouth every 6 (six) hours as needed for mild pain (or Fever >/= 101). 12/25/16   Gladstone Lighter, MD  albuterol (PROVENTIL HFA;VENTOLIN HFA) 108 (90 Base) MCG/ACT inhaler Inhale 2 puffs into the lungs every 4 (four) hours as needed for wheezing or shortness of breath. 01/23/18   Birdie Sons, MD  albuterol (PROVENTIL) (2.5 MG/3ML) 0.083% nebulizer solution Take 3 mLs (2.5 mg total) by nebulization every 4 (four) hours as needed for wheezing. 12/24/17   Birdie Sons, MD  alendronate (FOSAMAX) 70 MG tablet Take 1 tablet (70 mg total) by mouth every 7 (seven) days. Take with a full glass of water on an empty stomach. 01/23/18   Birdie Sons, MD  ALPRAZolam Duanne Moron) 0.5 MG tablet take 1 tablet by mouth three times a day if needed 01/23/18   Birdie Sons, MD  benzonatate (TESSALON) 100 MG capsule Take 1 capsule (100 mg total) by mouth 2 (two) times daily as needed for cough. Patient not taking: Reported on 02/05/2018  11/24/17   Birdie Sons, MD  celecoxib (CELEBREX) 100 MG capsule Take 1 capsule (100 mg total) by mouth 2 (two) times daily as needed. 01/23/18   Birdie Sons, MD  clopidogrel (PLAVIX) 75 MG tablet Take 75 mg by mouth daily.    [provider]  cyclobenzaprine (FLEXERIL) 5 MG tablet Take 1 tablet (5 mg total) by mouth 3 (three) times daily as needed for muscle spasms. 02/02/18   Birdie Sons, MD  diphenoxylate-atropine (LOMOTIL) 2.5-0.025 MG per tablet Take 2 tablets by mouth 4 (four) times daily as needed for diarrhea or loose stools. Reported on 03/11/2016    [provider]  donepezil (ARICEPT) 10 MG tablet Take 10 mg by mouth at bedtime.    [provider]  estropipate (OGEN) 0.75 MG tablet Take 1 tablet (0.75 mg total) by mouth every other day. 01/23/18   Birdie Sons, MD  fentaNYL (DURAGESIC - DOSED MCG/HR) 50 MCG/HR Place 1 patch (50 mcg total) onto the skin  every 3 (three) days. 04/22/18 05/22/18  Vevelyn Francois, NP  Fluticasone-Salmeterol (ADVAIR DISKUS) 250-50 MCG/DOSE AEPB inhale 1 dose by mouth twice a day 01/23/18   Birdie Sons, MD  furosemide (LASIX) 40 MG tablet Take 40 mg by mouth every other day.  02/16/17   [provider]  levofloxacin (LEVAQUIN) 250 MG tablet Take 1 tablet (250 mg total) by mouth daily for 5 days. 02/07/18 02/12/18  Fritzi Mandes, MD  LYRICA 300 MG capsule Take 300 mg by mouth 2 (two) times daily. 04/12/17   [provider]  mupirocin ointment (BACTROBAN) 2 % Place 1 application into the nose 2 (two) times daily. 06/03/17   Birdie Sons, MD  NARCAN 4 MG/0.1ML LIQD nasal spray kit Place 0.4 mg into the nose once.  09/11/17   [provider]  oseltamivir (TAMIFLU) 30 MG capsule Take 1 capsule (30 mg total) by mouth 2 (two) times daily for 3 days. 02/07/18 02/10/18  Fritzi Mandes, MD  pantoprazole (PROTONIX) 40 MG tablet Take 1 tablet (40 mg total) by mouth 2 (two) times daily. 01/23/18   Birdie Sons, MD    predniSONE (DELTASONE) 10 MG tablet To 50 mg daily.  Taper by 10 mg daily then stop 02/08/18   Fritzi Mandes, MD  silver sulfADIAZINE (SILVADENE) 1 % cream Apply topically daily. 01/26/18   Birdie Sons, MD  simvastatin (ZOCOR) 10 MG tablet Take 10 mg by mouth at bedtime. Reported on 12/04/2015    [provider]  SUMAtriptan (IMITREX) 25 MG tablet Take 1 tablet (25 mg total) by mouth as needed for migraine. May repeat in 2 hours if headache persists or recurs. 12/23/17   Birdie Sons, MD  tiotropium (SPIRIVA HANDIHALER) 18 MCG inhalation capsule inhale the contents of one capsule in the handihaler once daily 01/23/18   Caryn Section, Kirstie Peri, MD  traMADol (ULTRAM) 50 MG tablet Take 2 tablets (100 mg total) by mouth every 6 (six) hours as needed for moderate pain or severe pain. 02/21/18 05/22/18  Vevelyn Francois, NP  triamcinolone ointment (KENALOG) 0.5 % Apply to lesions on hands twice a day as needed 10/24/16   Birdie Sons, MD    Allergies Percocet [oxycodone-acetaminophen]; Aspirin; Codeine; Propoxyphene; and Sulfa antibiotics    Social History Social History   Tobacco Use  . Smoking status: Current Every Day Smoker    Packs/day: 0.50    Years: 50.00    Pack years: 25.00    Types: Cigarettes  . Smokeless tobacco: Never Used  . Tobacco comment: Previously smoked 2 ppd  Substance Use Topics  . Alcohol use: No    Alcohol/week: 0.0 oz  . Drug use: No    Review of Systems Patient denies headaches, rhinorrhea, blurry vision, numbness, shortness of breath, chest pain, edema, cough, abdominal pain, nausea, vomiting, diarrhea, dysuria, fevers, rashes or hallucinations unless otherwise stated above in HPI. ____________________________________________   PHYSICAL EXAM:  VITAL SIGNS: Vitals:   02/08/18 1338  BP: (!) 175/63  Pulse: 71  Resp: 18  Temp: 99.2 F (37.3 C)  SpO2: 96%    Constitutional: Alert chronically ill appearing  in no acute distress. Eyes: Conjunctivae  are normal.  Head: Atraumatic. Nose: No congestion/rhinnorhea. Mouth/Throat: Mucous membranes are moist.   Neck: No stridor. Painless ROM.  Cardiovascular: Normal rate, regular rhythm. Grossly normal heart sounds.  Good peripheral circulation. Respiratory: mild tachypnea with occasional wheeze and right sided inspiratory crackles Gastrointestinal: Soft and nontender. No distention. No abdominal  bruits. No CVA tenderness. Genitourinary:  Musculoskeletal: No lower extremity tenderness nor edema.  No joint effusions. Neurologic:  Normal speech and language. No gross focal neurologic deficits are appreciated. No facial droop Skin:  Skin is warm, dry and intact. No rash noted. Psychiatric: Mood and affect are normal. Speech and behavior are normal.  ____________________________________________   LABS (all labs ordered are listed, but only abnormal results are displayed)  Results for orders placed or performed during the hospital encounter of 02/08/18 (from the past 24 hour(s))  Lipase, blood     Status: None   Collection Time: 02/08/18  1:38 PM  Result Value Ref Range   Lipase 28 11 - 51 U/L  Comprehensive metabolic panel     Status: Abnormal   Collection Time: 02/08/18  1:38 PM  Result Value Ref Range   Sodium 140 135 - 145 mmol/L   Potassium 3.5 3.5 - 5.1 mmol/L   Chloride 102 101 - 111 mmol/L   CO2 26 22 - 32 mmol/L   Glucose, Bld 74 65 - 99 mg/dL   BUN 23 (H) 6 - 20 mg/dL   Creatinine, Ser 0.74 0.44 - 1.00 mg/dL   Calcium 8.8 (L) 8.9 - 10.3 mg/dL   Total Protein 7.2 6.5 - 8.1 g/dL   Albumin 3.4 (L) 3.5 - 5.0 g/dL   AST 27 15 - 41 U/L   ALT 20 14 - 54 U/L   Alkaline Phosphatase 129 (H) 38 - 126 U/L   Total Bilirubin 0.7 0.3 - 1.2 mg/dL   GFR calc non Af Amer >60 >60 mL/min   GFR calc Af Amer >60 >60 mL/min   Anion gap 12 5 - 15  CBC     Status: Abnormal   Collection Time: 02/08/18  1:38 PM  Result Value Ref Range   WBC 7.4 3.6 - 11.0 K/uL   RBC 4.09 3.80 - 5.20 MIL/uL    Hemoglobin 11.3 (L) 12.0 - 16.0 g/dL   HCT 34.8 (L) 35.0 - 47.0 %   MCV 84.9 80.0 - 100.0 fL   MCH 27.6 26.0 - 34.0 pg   MCHC 32.5 32.0 - 36.0 g/dL   RDW 16.6 (H) 11.5 - 14.5 %   Platelets 191 150 - 440 K/uL  Blood gas, venous     Status: Abnormal (Preliminary result)   Collection Time: 02/08/18  3:51 PM  Result Value Ref Range   pH, Ven 7.39 7.250 - 7.430   pCO2, Ven 41 (L) 44.0 - 60.0 mmHg   pO2, Ven PENDING 32.0 - 45.0 mmHg   Bicarbonate 24.8 20.0 - 28.0 mmol/L   Acid-base deficit 0.2 0.0 - 2.0 mmol/L   O2 Saturation PENDING %   Patient temperature 37.0    Collection site VEIN    Sample type VEIN    ____________________________________________ ____________________________________________  RADIOLOGY  I personally reviewed all radiographic images ordered to evaluate for the above acute complaints and reviewed radiology reports and findings.  These findings were personally discussed with the patient.  Please see medical record for radiology report.  ____________________________________________   PROCEDURES  Procedure(s) performed:  Procedures    Critical Care performed: no ____________________________________________   INITIAL IMPRESSION / ASSESSMENT AND PLAN / ED COURSE  Pertinent labs & imaging results that were available during my care of the patient were reviewed by me and considered in my medical decision making (see chart for details).  DDX: pna, chf, copd, dehydration, enteritis, colitis  Avaiah Stempel is a 67 y.o. who  presents to the ED with symptoms as described above failing outpatient management.  Patient discharged home yesterday.  She is very frail and ill-appearing.  Does appear mildly dehydrated.  Not with any sepsis criteria at this time but is unable to keep her oral antibiotics down for which she was discharged home.  Several episodes of nausea and vomiting.  Will send for C. Difficile.  At this point I do believe patient will require admission  for additional IV fluids IV antibiotics until tolerating oral hydration.      As part of my medical decision making, I reviewed the following data within the [place] notes reviewed and incorporated, Labs reviewed, notes from prior ED visits.   ____________________________________________   FINAL CLINICAL IMPRESSION(S) / ED DIAGNOSES  Final diagnoses:  Nausea vomiting and diarrhea      NEW MEDICATIONS STARTED DURING THIS VISIT:  New Prescriptions   No medications on file     Note:  This document was prepared using Dragon voice recognition software and may include unintentional dictation errors.    Merlyn Lot, MD 02/09/18 [phone]

## 2018-02-08 NOTE — Clinical Social Work Note (Signed)
Clinical Social Work Assessment  Patient Details  Name: Cheryl [name] MRN: [id number] Date of Birth: [birth year]  Date of referral:  02/08/18               Reason for consult:  Facility Placement                Permission sought to share information with:  Family Supports, Customer service manager Permission granted to share information::  Yes, Verbal Permission Granted  Name::     Cheryl [name] [phone]  Agency::  All facilities  Relationship::     Contact Information:     Housing/Transportation Living arrangements for the past 2 months:  [place] of Information:  Patient, Spouse Patient Interpreter Needed:  None Criminal Activity/Legal Involvement Pertinent to Current Situation/Hospitalization:  No - Comment as needed Significant Relationships:  Spouse Lives with:  Spouse, Relatives Do you feel safe going back to the place where you live?    Need for family participation in patient care:  Yes (Comment)  Care giving concerns: Cheryl [name] feels she needs higher level of care   Social Worker assessment / plan: LCSW met and introduced myself to family and patient and obtained verbal consent. In discussion the Cheryl [name] and grand daughter both live with the patient. They brought the patient back from today because she was so weak and sickly Pt comes into the ED via POV c/o abdominal pain and emesis that started an hour ago.  Patient was discharged from the hospital yesterday for pneumonia and the flu.  Patient chronically wears 3L for COPD.  Patient is oriented x4 but has mild dementia she needs some assistance with ADL's Patient has [employer] and medicaid. If patient is not admitted- the family will call home health back and rally around her. They would like her to go to [facility]. It was explained that admissions to [facility] today unlikely. LCSW consulted with EDP and he will place patient in OBS as she is dehydrated. LCSW will complete assessment and Fl2  and send information to [facility].  Employment status:  Retired Forensic scientist:  Information systems manager, Medicaid In [employer]) PT Recommendations:  [place] / Referral to community resources:  [place]  Patient/Family's Response to care: Would like her in SNF  Patient/Family's Understanding of and Emotional Response to Diagnosis, Current Treatment, and Prognosis:  Patient understands she needs to take her medicine and drink her fluids  Emotional Assessment Appearance:  Appears older than stated age Attitude/Demeanor/Rapport:  Gracious Affect (typically observed):  Apprehensive, Appropriate, Calm Orientation:  Oriented to Self, Oriented to Place, Oriented to  Time, Oriented to Situation Alcohol / Substance use:  Not Applicable Psych involvement (Current and /or in the community):  No (Comment)  Discharge Needs  Concerns to be addressed:  Coping/Stress Concerns Readmission within the last 30 days:  No Current discharge risk:  None Barriers to Discharge:  No Barriers Identified   Joana Reamer, LCSW 02/08/2018, 4:32 PM

## 2018-02-08 NOTE — ED Triage Notes (Signed)
Pt comes into the ED via POV c/o abdominal pain and emesis that started an hour ago.  Patient was discharged from the hospital yesterday for pneumonia and the flu.  Patient chronically wears 3L for COPD.  Patient in NAD at this time with even and unlabored respirations.  Patient denies any chest pain, increased shortness of breath or dizziness.

## 2018-02-09 ENCOUNTER — Telehealth: Payer: Self-pay

## 2018-02-09 DIAGNOSIS — E876 Hypokalemia: Secondary | ICD-10-CM | POA: Diagnosis not present

## 2018-02-09 DIAGNOSIS — J101 Influenza due to other identified influenza virus with other respiratory manifestations: Secondary | ICD-10-CM | POA: Diagnosis not present

## 2018-02-09 DIAGNOSIS — M6281 Muscle weakness (generalized): Secondary | ICD-10-CM | POA: Diagnosis not present

## 2018-02-09 DIAGNOSIS — R531 Weakness: Secondary | ICD-10-CM | POA: Diagnosis not present

## 2018-02-09 DIAGNOSIS — J189 Pneumonia, unspecified organism: Secondary | ICD-10-CM | POA: Diagnosis not present

## 2018-02-09 LAB — CBC
HCT: 29.7 % — ABNORMAL LOW (ref 35.0–47.0)
Hemoglobin: 9.7 g/dL — ABNORMAL LOW (ref 12.0–16.0)
MCH: 27.7 pg (ref 26.0–34.0)
MCHC: 32.8 g/dL (ref 32.0–36.0)
MCV: 84.5 fL (ref 80.0–100.0)
PLATELETS: 159 10*3/uL (ref 150–440)
RBC: 3.51 MIL/uL — ABNORMAL LOW (ref 3.80–5.20)
RDW: 16.6 % — AB (ref 11.5–14.5)
WBC: 4.2 10*3/uL (ref 3.6–11.0)

## 2018-02-09 LAB — BASIC METABOLIC PANEL
Anion gap: 10 (ref 5–15)
BUN: 13 mg/dL (ref 6–20)
CHLORIDE: 105 mmol/L (ref 101–111)
CO2: 23 mmol/L (ref 22–32)
CREATININE: 0.59 mg/dL (ref 0.44–1.00)
Calcium: 8 mg/dL — ABNORMAL LOW (ref 8.9–10.3)
GFR calc Af Amer: >60 mL/min (ref >60)
GFR calc non Af Amer: >60 mL/min (ref >60)
Glucose, Bld: 89 mg/dL (ref 65–99)
Potassium: 2.7 mmol/L — CL (ref 3.5–5.1)
SODIUM: 138 mmol/L (ref 135–145)

## 2018-02-09 MED ORDER — POTASSIUM CHLORIDE CRYS ER 20 MEQ PO TBCR
60.0000 meq | EXTENDED_RELEASE_TABLET | Freq: Once | ORAL | Status: AC
  Administered 2018-02-09: 60 meq via ORAL
  Filled 2018-02-09: qty 3

## 2018-02-09 NOTE — Care Management (Signed)
BSC delivered to room prior to discharge by [facility]

## 2018-02-09 NOTE — Care Management Obs Status (Signed)
MEDICARE OBSERVATION STATUS NOTIFICATION   Patient Details  Name: Cheryl [name] MRN: [id number] Date of Birth: 07/10/1951   Medicare Observation Status Notification Given:  No(admitted obs less than 24 hours)    Chapman FitchBOWEN, Vedanshi Massaro T, RN 02/09/2018, 11:15 AM

## 2018-02-09 NOTE — Clinical Social Work Note (Signed)
Patient's referral was sent out to all of the facilities [place]. Patient is ready for discharge today and patient has regular [employer]. Physician is stating that patient was admitted for placement. Patient was just informed by family that her sister, with whom she is close to, passed away this weekend. CSW spoke with patient and her husband and informed them that we would need to discharge home with home health today as Medicare [employer] can take 24 to 48 hours and sometimes even 72 hours to provide authorization for SNF. Patient's husband states patient has been to [facility] 3 times prior and wants to go there again. CSW called Raiford Nobleick at [facility] and he stated that they can take patient from home once they receive authorization from [facility]. CSW asked if they could bring patient in under medicaid today and then switch her to [employer] if they received auth but Raiford NobleRick checked with his administrator and they are unable to do this. CSW spoke again with patient and husband and husband is willing to take her back home until [facility]. Patient's husband verified that there will be family to assist. [place] SpanielMonica Ellianna [name] MSW,LCSW [phone]

## 2018-02-09 NOTE — Progress Notes (Signed)
Physical Therapy Evaluation Patient Details Name: Cheryl [name] MRN: [id number] DOB: 1950-12-24 Today's Date: 02/09/2018   History of Present Illness  Cheryl [name]  is a 67 y.o. female with a known history of aspirations, COPD, HLD, Htn, PVD- was admitted 2 days ago for Flu + pneumonia. Due to weakness PT had suggested rehab but pt chose to go home with home health, RN. Since yesterday, at home, pt was very weak, could not get up, Had nausea and could not take her oral pills. ER physician has called Child psychotherapist for placement she will not be able to get authorization from insurance for multiple days.  Clinical Impression  Pt admitted with above diagnosis. Pt currently with functional limitations due to the deficits listed below (see PT Problem List). Cues and minA+1 to transition to sitting at EOB. Pt able to remain sitting without support but severe thoracic kyphosis noted with difficulty sitting upright. ModA+1 to come to standing and once upright minA+1 to remain standing with bilateral UE support on walker. Attempted static marches but pt unable to perform due to weakness and imbalance. Pt descends back onto bed and unable to perform any additional activities due to weakness/pain. Pt will need SNF placement at discharge in order to return to prior level of function at home. Pt will benefit from PT services to address deficits in strength, balance, and mobility in order to return to full function at home.        Follow Up Recommendations SNF    Equipment Recommendations  3in1 (PT);Other (comment)(Pt provided with gait belt)    Recommendations for Other Services       Precautions / Restrictions Precautions Precautions: Fall Precaution Comments: on O2 3L but at home PRN Restrictions Weight Bearing Restrictions: No      Mobility  Bed Mobility Overal bed mobility: Needs Assistance Bed Mobility: Supine to Sit;Sit to Supine     Supine to sit: Min assist Sit to supine: Min  assist   General bed mobility comments: Cues and minA+1 to transition to sitting at EOB. Pt able to remain sitting without support but severe thoracic kyphosis noted  Transfers Overall transfer level: Needs assistance Equipment used: Rolling walker (2 wheeled) Transfers: Sit to/from Stand Sit to Stand: Mod assist         General transfer comment: ModA+1 to come to standing and once upright minA+1 to remain standing with bilateral UE support on walker. Attempted static marches but pt unable to perform. Pt descends back onto bed and unable to perform any additional activities due to weakness/pain  Ambulation/Gait             General Gait Details: Unable/unsafe to attempt  Stairs            Wheelchair Mobility    Modified Rankin (Stroke Patients Only)       Balance Overall balance assessment: Needs assistance Sitting-balance support: No upper extremity supported Sitting balance-Leahy Scale: Fair     Standing balance support: Bilateral upper extremity supported Standing balance-Leahy Scale: Poor                               Pertinent Vitals/Pain Pain Assessment: Faces Faces Pain Scale: Hurts even more Pain Location: Pt states that her legs hurt when she attempts to stand Pain Descriptors / Indicators: Aching Pain Intervention(s): Monitored during session    Home Living Family/patient expects to be discharged to:: Private residence Living Arrangements: Children;Spouse/significant other;Other (  Comment)(Granddaughter) Available Help at Discharge: Family;Available 24 hours/day Type of Home: House Home Access: Ramped entrance     Home Layout: One level Home Equipment: Walker - 2 wheels;Shower seat - built in;Grab bars - tub/shower;Grab bars - toilet;Bedside commode;Wheelchair - IT trainermanual;Electric scooter;Other (comment)(O2 at 3L/min prn)      Prior Function Level of Independence: Needs assistance   Gait / Transfers Assistance Needed: Ind with  household ambulation using RW previously but recently too weak to walk  ADL's / Homemaking Assistance Needed: Husband assists with getting in/out of shower, but able to bathe indep once in shower (on seat).  Husband assisting with dressing.          Hand Dominance   Dominant Hand: Right    Extremity/Trunk Assessment   Upper Extremity Assessment Upper Extremity Assessment: Generalized weakness    Lower Extremity Assessment Lower Extremity Assessment: Generalized weakness    Cervical / Trunk Assessment Cervical / Trunk Assessment: Kyphotic  Communication   Communication: No difficulties  Cognition Arousal/Alertness: Awake/alert Behavior During Therapy: Flat affect Overall Cognitive Status: History of cognitive impairments - at baseline                                        General Comments      Exercises     Assessment/Plan    PT Assessment Patient needs continued PT services  PT Problem List Decreased strength;Decreased range of motion;Decreased activity tolerance;Decreased balance;Decreased mobility;Decreased coordination;Decreased cognition;Decreased knowledge of use of DME;Decreased safety awareness;Pain       PT Treatment Interventions DME instruction;Gait training;Functional mobility training;Therapeutic activities;Therapeutic exercise;Balance training;Neuromuscular re-education;Patient/family education    PT Goals (Current goals can be found in the Care Plan section)  Acute Rehab PT Goals Patient Stated Goal: Improve strength and mobility PT Goal Formulation: With family Time For Goal Achievement: 02/23/18 Potential to Achieve Goals: Fair    Frequency Min 2X/week   Barriers to discharge        Co-evaluation               AM-PAC PT "6 Clicks" Daily Activity  Outcome Measure Difficulty turning over in bed (including adjusting bedclothes, sheets and blankets)?: Unable Difficulty moving from lying on back to sitting on the side  of the bed? : Unable Difficulty sitting down on and standing up from a chair with arms (e.g., wheelchair, bedside commode, etc,.)?: Unable Help needed moving to and from a bed to chair (including a wheelchair)?: A Lot Help needed walking in hospital room?: Total Help needed climbing 3-5 steps with a railing? : Total 6 Click Score: 7    End of Session Equipment Utilized During Treatment: Oxygen Activity Tolerance: Patient limited by fatigue Patient left: in bed;with call bell/phone within reach;with bed alarm set;with family/visitor present   PT Visit Diagnosis: Muscle weakness (generalized) (M62.81);Adult, failure to thrive (R62.7);Difficulty in walking, not elsewhere classified (R26.2);Unsteadiness on feet (R26.81)    Time: [id number] PT Time Calculation (min) (ACUTE ONLY): 16 min   Charges:   PT Evaluation $PT Eval Low Complexity: 1 Low     PT G Codes:        Sharalyn InkJason D Yarissa Reining PT, DPT    Araeya Lamb 02/09/2018, 12:37 PM

## 2018-02-09 NOTE — Progress Notes (Signed)
Dr Katheren ShamsSalary notified of critical potassium 2.7, orders given

## 2018-02-09 NOTE — Telephone Encounter (Signed)
Called pt to complete a TCM call and was advised pt is still in the hospital. Will CB once she has left.  -MM

## 2018-02-09 NOTE — Care Management Note (Signed)
Case Management Note  Patient Details  Name: Cheryl [name] MRN: [id number] Date of Birth: 1950-12-29   Patient medically cleared and to discharge home today with home health orders.  Barbara CowerJason with [facility] notified.  Family wishes to pursue placement at facility after they are home. Jason notified.  RNCM signing off   Subjective/Objective:                    Action/Plan:   Expected Discharge Date:  02/09/18               Expected Discharge Plan:  Home w Home Health Services  In-House Referral:     Discharge planning Services  CM Consult  Post Acute Care Choice:  Resumption of Svcs/PTA Provider Choice offered to:     DME Arranged:    DME Agency:     HH Arranged:  RN, PT, OT, Nurse's Aide, Social Work [employer] Agency:  [facility]  Status of Service:  Completed, signed off  If discussed at [employer] Meetings, dates discussed:    Additional Comments:  Chapman FitchBOWEN, Sharnita Bogucki T, RN 02/09/2018, 11:20 AM

## 2018-02-09 NOTE — Discharge Summary (Signed)
[facility] [place] NAME: Cheryl [name]    MR#:  [id number]  DATE OF BIRTH:  1950/12/13  DATE OF ADMISSION:  02/08/2018 ADMITTING PHYSICIAN: Vaughan Basta, MD  DATE OF DISCHARGE: 02/09/2018  PRIMARY CARE PHYSICIAN: Birdie Sons, MD    ADMISSION DIAGNOSIS:  Nausea vomiting and diarrhea [R11.2, R19.7]  DISCHARGE DIAGNOSIS:  Principal Problem:   Generalized weakness Active Problems:   Pneumonia   SECONDARY DIAGNOSIS:   Past Medical History:  Diagnosis Date  . Allergy   . Anxiety   . Arthritis   . Aspiration pneumonitis ([place]) 11/24/2015  . Asthma   . Chronic pain   . COPD (chronic obstructive pulmonary disease) ([place])   . Coronary artery disease   . DVT (deep venous thrombosis) ([place])   . GERD (gastroesophageal reflux disease)   . Headache   . Hyperlipidemia   . Hypertension   . Kyphoscoliosis deformity of spine   . Migraines   . Neuropathy 2010  . Osteoporosis   . Oxygen deficiency   . Peripheral vascular disease ([place])   . Pneumonia   . Pneumonia 11/19/2015  . Pneumonia 10/2016  . Pneumonia    aspiration 2018- 4 times in last year  . Vitamin D deficiency     HOSPITAL COURSE:   67 year old female with history of COPD and chronic respiratory failure who was admitted 2 days ago with influenza pneumonia presents to the emergency room due to generalized weakness.  1.  Generalized weakness: This is due to known pneumonia and influenza Patient will be discharged back to home with home health care. Clinical social worker has been consulted. Family will work on skilled nursing facility placement once patient is at home.  2.  Recent influenza A: Patient will continue Tamiflu for a total of 5 days.  3.  Recent pneumonia: Patient will continue Levaquin.  4.  Hypokalemia: This is repleted prior to discharge  5.  COPD without signs of exacerbation  6.  PVD: Patient will continue aspirin and Plavix  DISCHARGE  CONDITIONS AND DIET:   Patient will be discharged home on regular diet  CONSULTS OBTAINED:    DRUG ALLERGIES:   Allergies  Allergen Reactions  . Percocet [Oxycodone-Acetaminophen] Hives and Rash  . Aspirin Nausea And Vomiting and Other (See Comments)    Reaction:  GI upset   . Codeine Nausea And Vomiting, Other (See Comments) and Nausea Only    Reaction:  GI upset   . Propoxyphene Other (See Comments) and Nausea Only    GI upset Reaction:  GI upset   . Sulfa Antibiotics Rash and Other (See Comments)    Reaction:  GI upset     DISCHARGE MEDICATIONS:   Allergies as of 02/09/2018      Reactions   Percocet [oxycodone-acetaminophen] Hives, Rash   Aspirin Nausea And Vomiting, Other (See Comments)   Reaction:  GI upset    Codeine Nausea And Vomiting, Other (See Comments), Nausea Only   Reaction:  GI upset    Propoxyphene Other (See Comments), Nausea Only   GI upset Reaction:  GI upset    Sulfa Antibiotics Rash, Other (See Comments)   Reaction:  GI upset       Medication List    STOP taking these medications   benzonatate 100 MG capsule Commonly known as:  TESSALON   mupirocin ointment 2 % Commonly known as:  BACTROBAN   SUMAtriptan 25 MG tablet Commonly known as:  IMITREX  traMADol 50 MG tablet Commonly known as:  ULTRAM     TAKE these medications   acetaminophen 325 MG tablet Commonly known as:  TYLENOL Take 2 tablets (650 mg total) by mouth every 6 (six) hours as needed for mild pain (or Fever >/= 101).   albuterol (2.5 MG/3ML) 0.083% nebulizer solution Commonly known as:  PROVENTIL Take 3 mLs (2.5 mg total) by nebulization every 4 (four) hours as needed for wheezing.   albuterol 108 (90 Base) MCG/ACT inhaler Commonly known as:  PROVENTIL HFA;VENTOLIN HFA Inhale 2 puffs into the lungs every 4 (four) hours as needed for wheezing or shortness of breath.   alendronate 70 MG tablet Commonly known as:  FOSAMAX Take 1 tablet (70 mg total) by mouth every 7  (seven) days. Take with a full glass of water on an empty stomach.   ALPRAZolam 0.5 MG tablet Commonly known as:  XANAX take 1 tablet by mouth three times a day if needed   celecoxib 100 MG capsule Commonly known as:  CELEBREX Take 1 capsule (100 mg total) by mouth 2 (two) times daily as needed.   clopidogrel 75 MG tablet Commonly known as:  PLAVIX Take 75 mg by mouth daily.   cyclobenzaprine 5 MG tablet Commonly known as:  FLEXERIL Take 1 tablet (5 mg total) by mouth 3 (three) times daily as needed for muscle spasms.   diphenoxylate-atropine 2.5-0.025 MG tablet Commonly known as:  LOMOTIL Take 2 tablets by mouth 4 (four) times daily as needed for diarrhea or loose stools. Reported on 03/11/2016   donepezil 10 MG tablet Commonly known as:  ARICEPT Take 10 mg by mouth at bedtime.   DULoxetine 30 MG capsule Commonly known as:  CYMBALTA Take 1 capsule by mouth daily.   estropipate 0.75 MG tablet Commonly known as:  OGEN Take 1 tablet (0.75 mg total) by mouth every other day.   fentaNYL 50 MCG/HR Commonly known as:  DURAGESIC - dosed mcg/hr Place 1 patch (50 mcg total) onto the skin every 3 (three) days. Start taking on:  04/22/2018   Fluticasone-Salmeterol 250-50 MCG/DOSE Aepb Commonly known as:  ADVAIR DISKUS inhale 1 dose by mouth twice a day   furosemide 40 MG tablet Commonly known as:  LASIX Take 40 mg by mouth every other day.   levofloxacin 250 MG tablet Commonly known as:  LEVAQUIN Take 1 tablet (250 mg total) by mouth daily for 5 days.   LYRICA 300 MG capsule Generic drug:  pregabalin Take 300 mg by mouth 2 (two) times daily.   NARCAN 4 MG/0.1ML Liqd nasal spray kit Generic drug:  naloxone Place 0.4 mg into the nose once.   oseltamivir 30 MG capsule Commonly known as:  TAMIFLU Take 1 capsule (30 mg total) by mouth 2 (two) times daily for 3 days.   pantoprazole 40 MG tablet Commonly known as:  PROTONIX Take 1 tablet (40 mg total) by mouth 2 (two)  times daily.   potassium chloride 10 MEQ tablet Commonly known as:  K-DUR Take 1 tablet by mouth daily.   predniSONE 10 MG tablet Commonly known as:  DELTASONE To 50 mg daily.  Taper by 10 mg daily then stop   silver sulfADIAZINE 1 % cream Commonly known as:  SILVADENE Apply topically daily.   simvastatin 10 MG tablet Commonly known as:  ZOCOR Take 10 mg by mouth at bedtime. Reported on 12/04/2015   tiotropium 18 MCG inhalation capsule Commonly known as:  SPIRIVA HANDIHALER inhale the contents of  one capsule in the handihaler once daily   triamcinolone ointment 0.5 % Commonly known as:  KENALOG Apply to lesions on hands twice a day as needed         Today   CHIEF COMPLAINT:   No acute events overnight   VITAL SIGNS:  Blood pressure (!) 173/76, pulse 86, temperature 98.8 F (37.1 C), temperature source Oral, resp. rate 20, height '4\' 10"'$  (1.473 m), weight 44.8 kg (98 lb 12.3 oz), SpO2 100 %.   REVIEW OF SYSTEMS:  Review of Systems  Constitutional: Negative.  Negative for chills, fever and malaise/fatigue.  HENT: Negative.  Negative for ear discharge, ear pain, hearing loss, nosebleeds and sore throat.   Eyes: Negative.  Negative for blurred vision and pain.  Respiratory: Negative.  Negative for cough, hemoptysis, shortness of breath and wheezing.   Cardiovascular: Negative.  Negative for chest pain, palpitations and leg swelling.  Gastrointestinal: Negative.  Negative for abdominal pain, blood in stool, diarrhea, nausea and vomiting.  Genitourinary: Negative.  Negative for dysuria.  Musculoskeletal: Negative.  Negative for back pain.  Skin: Negative.   Neurological: Negative for dizziness, tremors, speech change, focal weakness, seizures and headaches.  Endo/Heme/Allergies: Negative.  Does not bruise/bleed easily.  Psychiatric/Behavioral: Positive for depression. Negative for hallucinations and suicidal ideas.     PHYSICAL EXAMINATION:  GENERAL:  67  y.o.-year-old patient lying in the bed with no acute distress.  Thin and frail NECK:  Supple, no jugular venous distention. No thyroid enlargement, no tenderness.  LUNGS: Normal breath sounds bilaterally, no wheezing, rales,rhonchi  No use of accessory muscles of respiration.  CARDIOVASCULAR: S1, S2 normal. No murmurs, rubs, or gallops.  ABDOMEN: Soft, non-tender, non-distended. Bowel sounds present. No organomegaly or mass.  EXTREMITIES: No pedal edema, cyanosis, or clubbing.  PSYCHIATRIC: The patient is alert and oriented x 3.  SKIN: No obvious rash, lesion, or ulcer.   DATA REVIEW:   CBC Recent Labs  Lab 02/09/18 0534  WBC 4.2  HGB 9.7*  HCT 29.7*  PLT 159    Chemistries  Recent Labs  Lab 02/08/18 1338 02/09/18 0534  NA 140 138  K 3.5 2.7*  CL 102 105  CO2 26 23  GLUCOSE 74 89  BUN 23* 13  CREATININE 0.74 0.59  CALCIUM 8.8* 8.0*  AST 27  --   ALT 20  --   ALKPHOS 129*  --   BILITOT 0.7  --     Cardiac Enzymes Recent Labs  Lab 02/05/18 1854  TROPONINI <0.03    Microbiology Results  '@MICRORSLT48'$ @  RADIOLOGY:  Dg Chest 2 View  Result Date: 02/08/2018 CLINICAL DATA:  Abdominal pain and emesis. EXAM: CHEST - 2 VIEW COMPARISON:  February 05, 2018 FINDINGS: Healed right rib fracture. Probable hiatal hernia. The heart, hila, and mediastinum are stable. Mild opacity seen posteriorly on the lateral view suggests possible infiltrate. The patient does report recent pneumonia. IMPRESSION: Mild opacity posteriorly on the lateral view suggest a small amount of residual infiltrate in the left base. Recommend follow-up to complete resolution. Electronically Signed   By: Dorise Bullion III M.D   On: 02/08/2018 16:07      Allergies as of 02/09/2018      Reactions   Percocet [oxycodone-acetaminophen] Hives, Rash   Aspirin Nausea And Vomiting, Other (See Comments)   Reaction:  GI upset    Codeine Nausea And Vomiting, Other (See Comments), Nausea Only   Reaction:  GI upset     Propoxyphene Other (See Comments),  Nausea Only   GI upset Reaction:  GI upset    Sulfa Antibiotics Rash, Other (See Comments)   Reaction:  GI upset       Medication List    STOP taking these medications   benzonatate 100 MG capsule Commonly known as:  TESSALON   mupirocin ointment 2 % Commonly known as:  BACTROBAN   SUMAtriptan 25 MG tablet Commonly known as:  IMITREX   traMADol 50 MG tablet Commonly known as:  ULTRAM     TAKE these medications   acetaminophen 325 MG tablet Commonly known as:  TYLENOL Take 2 tablets (650 mg total) by mouth every 6 (six) hours as needed for mild pain (or Fever >/= 101).   albuterol (2.5 MG/3ML) 0.083% nebulizer solution Commonly known as:  PROVENTIL Take 3 mLs (2.5 mg total) by nebulization every 4 (four) hours as needed for wheezing.   albuterol 108 (90 Base) MCG/ACT inhaler Commonly known as:  PROVENTIL HFA;VENTOLIN HFA Inhale 2 puffs into the lungs every 4 (four) hours as needed for wheezing or shortness of breath.   alendronate 70 MG tablet Commonly known as:  FOSAMAX Take 1 tablet (70 mg total) by mouth every 7 (seven) days. Take with a full glass of water on an empty stomach.   ALPRAZolam 0.5 MG tablet Commonly known as:  XANAX take 1 tablet by mouth three times a day if needed   celecoxib 100 MG capsule Commonly known as:  CELEBREX Take 1 capsule (100 mg total) by mouth 2 (two) times daily as needed.   clopidogrel 75 MG tablet Commonly known as:  PLAVIX Take 75 mg by mouth daily.   cyclobenzaprine 5 MG tablet Commonly known as:  FLEXERIL Take 1 tablet (5 mg total) by mouth 3 (three) times daily as needed for muscle spasms.   diphenoxylate-atropine 2.5-0.025 MG tablet Commonly known as:  LOMOTIL Take 2 tablets by mouth 4 (four) times daily as needed for diarrhea or loose stools. Reported on 03/11/2016   donepezil 10 MG tablet Commonly known as:  ARICEPT Take 10 mg by mouth at bedtime.   DULoxetine 30 MG  capsule Commonly known as:  CYMBALTA Take 1 capsule by mouth daily.   estropipate 0.75 MG tablet Commonly known as:  OGEN Take 1 tablet (0.75 mg total) by mouth every other day.   fentaNYL 50 MCG/HR Commonly known as:  DURAGESIC - dosed mcg/hr Place 1 patch (50 mcg total) onto the skin every 3 (three) days. Start taking on:  04/22/2018   Fluticasone-Salmeterol 250-50 MCG/DOSE Aepb Commonly known as:  ADVAIR DISKUS inhale 1 dose by mouth twice a day   furosemide 40 MG tablet Commonly known as:  LASIX Take 40 mg by mouth every other day.   levofloxacin 250 MG tablet Commonly known as:  LEVAQUIN Take 1 tablet (250 mg total) by mouth daily for 5 days.   LYRICA 300 MG capsule Generic drug:  pregabalin Take 300 mg by mouth 2 (two) times daily.   NARCAN 4 MG/0.1ML Liqd nasal spray kit Generic drug:  naloxone Place 0.4 mg into the nose once.   oseltamivir 30 MG capsule Commonly known as:  TAMIFLU Take 1 capsule (30 mg total) by mouth 2 (two) times daily for 3 days.   pantoprazole 40 MG tablet Commonly known as:  PROTONIX Take 1 tablet (40 mg total) by mouth 2 (two) times daily.   potassium chloride 10 MEQ tablet Commonly known as:  K-DUR Take 1 tablet by mouth daily.  predniSONE 10 MG tablet Commonly known as:  DELTASONE To 50 mg daily.  Taper by 10 mg daily then stop   silver sulfADIAZINE 1 % cream Commonly known as:  SILVADENE Apply topically daily.   simvastatin 10 MG tablet Commonly known as:  ZOCOR Take 10 mg by mouth at bedtime. Reported on 12/04/2015   tiotropium 18 MCG inhalation capsule Commonly known as:  SPIRIVA HANDIHALER inhale the contents of one capsule in the handihaler once daily   triamcinolone ointment 0.5 % Commonly known as:  KENALOG Apply to lesions on hands twice a day as needed         Management plans discussed with the patient and she is in agreement. Stable [facility] discharge home with [facility]  Patient should follow up with pcp  CODE  STATUS:     Code Status Orders  (From admission, onward)        Start     Ordered   02/08/18 1836  Full code  Continuous     02/08/18 1835    Code Status History    Date Active Date Inactive Code Status Order ID Comments User Context   02/05/2018 20:25 02/07/2018 21:19 Full Code [id number]  Hillary Bow, MD ED   04/29/2017 13:14 05/02/2017 21:48 Full Code [id number]  Epifanio Lesches, MD ED   12/23/2016 17:45 12/25/2016 18:49 Full Code [id number]  Theodoro Grist, MD ED   11/06/2016 09:00 11/07/2016 18:53 Full Code [id number]  Max Sane, MD Inpatient   11/05/2016 14:01 11/06/2016 09:00 Partial Code [id number]  Bettey Costa, MD Inpatient   06/03/2016 02:53 06/05/2016 19:45 Full Code [id number]  Quintella Baton, MD Inpatient   11/19/2015 12:18 11/24/2015 16:24 Full Code [id number]  Demetrios Loll, MD Inpatient   11/09/2015 23:02 11/13/2015 18:51 Full Code [id number]  Lytle Butte, MD ED   10/17/2015 22:26 10/22/2015 13:03 Full Code [id number]  Fritzi Mandes, MD Inpatient   10/09/2015 17:38 10/12/2015 19:34 Full Code [id number]  Demetrios Loll, MD Inpatient   08/01/2015 00:08 08/03/2015 14:54 Full Code [id number]  Lance Coon, MD Inpatient    Advance Directive Documentation     Most Recent Value  Type of Advance Directive  Healthcare Power of [place], Living will  Pre-existing out of facility DNR order (yellow form or pink MOST form)  No data  "MOST" Form in Place?  No data      TOTAL TIME TAKING CARE OF THIS PATIENT: 38 minutes.    Note: This dictation was prepared with Dragon dictation along with smaller phrase technology. Any transcriptional errors that result from this process are unintentional.  Kamron Vanwyhe M.D on 02/09/2018 at 11:51 AM  Between 7am to 6pm - Pager - [phone] After 6pm go to [email or web address] - password [email or web address] [facility]  Office  [phone]  CC: Primary care physician; Birdie Sons, MD

## 2018-02-09 NOTE — Progress Notes (Signed)
Discharge instructions reviewed with patient and boyfriend/caregiver with patient's permission. Both parties verbalized understanding. Patient reminded to follow up with MD at follow up appointment.

## 2018-02-10 LAB — CULTURE, BLOOD (ROUTINE X 2)
CULTURE: NO GROWTH
Culture: NO GROWTH
Special Requests: ADEQUATE
Special Requests: ADEQUATE

## 2018-02-10 NOTE — Telephone Encounter (Signed)
Transition Care Management Follow-Up Telephone Call   Date discharged and where: [facility] on 02/09/18.  How have you been since you were released from the hospital? Pt states she is feeling better but is very weak and is having trouble walking. Pt is awaiting [facility] and let her know if she is covered and able to go to [facility] (rehab) to regain strength in her legs and arms. Pt denies nausea, vomiting fever or sputum. Pt still has some no diarrhea and a slight cough. Pt does not have an appetite and is only eating sherbet currently. Pt last vomited yesterday, and it was black liquid.   Any patient concerns? None.   Items Reviewed:   Meds: Declined at this time  Allergies: Verified  Dietary Changes Reviewed: N/A  Functional Questionnaire:  Independent-I Dependent-D  ADLs:   Dressing- I    Eating- I   Maintaining continence- I   Transferring- I   Transportation- I   Meal Prep- I   Managing Meds- I  Confirmed importance and Date/Time of follow-up visits scheduled: 02/12/18 @ 11:20, however pt will call and cancel this apt if she goes to rehab.   Confirmed with patient if condition worsens to call PCP or go to the Emergency Dept. Patient was given office number and encouraged to call back with questions or concerns: YES

## 2018-02-11 DIAGNOSIS — I739 Peripheral vascular disease, unspecified: Secondary | ICD-10-CM | POA: Diagnosis not present

## 2018-02-11 DIAGNOSIS — J1008 Influenza due to other identified influenza virus with other specified pneumonia: Secondary | ICD-10-CM | POA: Diagnosis not present

## 2018-02-11 DIAGNOSIS — I2510 Atherosclerotic heart disease of native coronary artery without angina pectoris: Secondary | ICD-10-CM | POA: Diagnosis not present

## 2018-02-11 DIAGNOSIS — J440 Chronic obstructive pulmonary disease with acute lower respiratory infection: Secondary | ICD-10-CM | POA: Diagnosis not present

## 2018-02-11 DIAGNOSIS — I10 Essential (primary) hypertension: Secondary | ICD-10-CM | POA: Diagnosis not present

## 2018-02-11 DIAGNOSIS — G8929 Other chronic pain: Secondary | ICD-10-CM | POA: Diagnosis not present

## 2018-02-11 DIAGNOSIS — J449 Chronic obstructive pulmonary disease, unspecified: Secondary | ICD-10-CM | POA: Diagnosis not present

## 2018-02-11 DIAGNOSIS — J181 Lobar pneumonia, unspecified organism: Secondary | ICD-10-CM | POA: Diagnosis not present

## 2018-02-11 DIAGNOSIS — J441 Chronic obstructive pulmonary disease with (acute) exacerbation: Secondary | ICD-10-CM | POA: Diagnosis not present

## 2018-02-11 DIAGNOSIS — M6281 Muscle weakness (generalized): Secondary | ICD-10-CM | POA: Diagnosis not present

## 2018-02-12 ENCOUNTER — Encounter: Payer: Self-pay | Admitting: Family Medicine

## 2018-02-12 ENCOUNTER — Telehealth: Payer: Self-pay | Admitting: Family Medicine

## 2018-02-12 ENCOUNTER — Ambulatory Visit (INDEPENDENT_AMBULATORY_CARE_PROVIDER_SITE_OTHER): Payer: Medicare HMO | Admitting: Family Medicine

## 2018-02-12 VITALS — BP 98/64 | HR 120 | Temp 98.5°F | Resp 24

## 2018-02-12 DIAGNOSIS — R112 Nausea with vomiting, unspecified: Secondary | ICD-10-CM | POA: Diagnosis not present

## 2018-02-12 DIAGNOSIS — J440 Chronic obstructive pulmonary disease with acute lower respiratory infection: Secondary | ICD-10-CM | POA: Diagnosis not present

## 2018-02-12 DIAGNOSIS — J449 Chronic obstructive pulmonary disease, unspecified: Secondary | ICD-10-CM | POA: Diagnosis not present

## 2018-02-12 DIAGNOSIS — L03116 Cellulitis of left lower limb: Secondary | ICD-10-CM

## 2018-02-12 DIAGNOSIS — J189 Pneumonia, unspecified organism: Secondary | ICD-10-CM

## 2018-02-12 DIAGNOSIS — R262 Difficulty in walking, not elsewhere classified: Secondary | ICD-10-CM | POA: Diagnosis not present

## 2018-02-12 DIAGNOSIS — I10 Essential (primary) hypertension: Secondary | ICD-10-CM | POA: Diagnosis not present

## 2018-02-12 DIAGNOSIS — E785 Hyperlipidemia, unspecified: Secondary | ICD-10-CM | POA: Diagnosis not present

## 2018-02-12 DIAGNOSIS — R2681 Unsteadiness on feet: Secondary | ICD-10-CM | POA: Diagnosis not present

## 2018-02-12 DIAGNOSIS — J441 Chronic obstructive pulmonary disease with (acute) exacerbation: Secondary | ICD-10-CM | POA: Diagnosis not present

## 2018-02-12 DIAGNOSIS — R419 Unspecified symptoms and signs involving cognitive functions and awareness: Secondary | ICD-10-CM | POA: Diagnosis not present

## 2018-02-12 DIAGNOSIS — G9341 Metabolic encephalopathy: Secondary | ICD-10-CM | POA: Diagnosis not present

## 2018-02-12 DIAGNOSIS — G894 Chronic pain syndrome: Secondary | ICD-10-CM | POA: Diagnosis not present

## 2018-02-12 DIAGNOSIS — I2510 Atherosclerotic heart disease of native coronary artery without angina pectoris: Secondary | ICD-10-CM | POA: Diagnosis not present

## 2018-02-12 DIAGNOSIS — J1008 Influenza due to other identified influenza virus with other specified pneumonia: Secondary | ICD-10-CM | POA: Diagnosis not present

## 2018-02-12 DIAGNOSIS — A419 Sepsis, unspecified organism: Secondary | ICD-10-CM | POA: Diagnosis not present

## 2018-02-12 DIAGNOSIS — R197 Diarrhea, unspecified: Secondary | ICD-10-CM | POA: Diagnosis not present

## 2018-02-12 DIAGNOSIS — M6281 Muscle weakness (generalized): Secondary | ICD-10-CM | POA: Diagnosis not present

## 2018-02-12 DIAGNOSIS — K219 Gastro-esophageal reflux disease without esophagitis: Secondary | ICD-10-CM | POA: Diagnosis not present

## 2018-02-12 DIAGNOSIS — J181 Lobar pneumonia, unspecified organism: Secondary | ICD-10-CM | POA: Diagnosis not present

## 2018-02-12 MED ORDER — CEFDINIR 300 MG PO CAPS: 300.0000 mg | ORAL_CAPSULE | Freq: Two times a day (BID) | ORAL | 0 refills | Status: AC

## 2018-02-12 NOTE — Progress Notes (Signed)
Patient: Laniece Hornbaker Female    DOB: August 02, 1951   67 y.o.   MRN: [id number] Visit Date: 02/12/2018  Today's Provider: Lelon Huh, MD   No chief complaint on file.  Subjective:    HPI   Follow up Hospitalization  Patient was admitted to [facility] on 02/05/2018 and discharged on 02/07/2018. She was treated for Sepsis. Treatment for this included; patient was seen back in the ER 2 more times with symptoms of pneumonia and influenza. Telephone follow up was done on 02/09/2018 She reports good compliance with treatment. She reports this condition is Improved.      Allergies  Allergen Reactions  . Percocet [Oxycodone-Acetaminophen] Hives and Rash  . Aspirin Nausea And Vomiting and Other (See Comments)    Reaction:  GI upset   . Codeine Nausea And Vomiting, Other (See Comments) and Nausea Only    Reaction:  GI upset   . Propoxyphene Other (See Comments) and Nausea Only    GI upset Reaction:  GI upset   . Sulfa Antibiotics Rash and Other (See Comments)    Reaction:  GI upset      Current Outpatient Medications:  .  acetaminophen (TYLENOL) 325 MG tablet, Take 2 tablets (650 mg total) by mouth every 6 (six) hours as needed for mild pain (or Fever >/= 101)., Disp: 30 tablet, Rfl: 0 .  albuterol (PROVENTIL HFA;VENTOLIN HFA) 108 (90 Base) MCG/ACT inhaler, Inhale 2 puffs into the lungs every 4 (four) hours as needed for wheezing or shortness of breath., Disp: 18 g, Rfl: 3 .  albuterol (PROVENTIL) (2.5 MG/3ML) 0.083% nebulizer solution, Take 3 mLs (2.5 mg total) by nebulization every 4 (four) hours as needed for wheezing., Disp: 50 vial, Rfl: 12 .  alendronate (FOSAMAX) 70 MG tablet, Take 1 tablet (70 mg total) by mouth every 7 (seven) days. Take with a full glass of water on an empty stomach., Disp: 4 tablet, Rfl: 11 .  ALPRAZolam (XANAX) 0.5 MG tablet, take 1 tablet by mouth three times a day if needed (Patient not taking: Reported on 02/08/2018), Disp: 60 tablet, Rfl: 5 .   celecoxib (CELEBREX) 100 MG capsule, Take 1 capsule (100 mg total) by mouth 2 (two) times daily as needed., Disp: 60 capsule, Rfl: 5 .  clopidogrel (PLAVIX) 75 MG tablet, Take 75 mg by mouth daily., Disp: , Rfl:  .  cyclobenzaprine (FLEXERIL) 5 MG tablet, Take 1 tablet (5 mg total) by mouth 3 (three) times daily as needed for muscle spasms., Disp: 30 tablet, Rfl: 5 .  diphenoxylate-atropine (LOMOTIL) 2.5-0.025 MG per tablet, Take 2 tablets by mouth 4 (four) times daily as needed for diarrhea or loose stools. Reported on 03/11/2016, Disp: , Rfl:  .  donepezil (ARICEPT) 10 MG tablet, Take 10 mg by mouth at bedtime., Disp: , Rfl:  .  DULoxetine (CYMBALTA) 30 MG capsule, Take 1 capsule by mouth daily., Disp: , Rfl:  .  estropipate (OGEN) 0.75 MG tablet, Take 1 tablet (0.75 mg total) by mouth every other day., Disp: 30 tablet, Rfl: 3 .  [START ON 04/22/2018] fentaNYL (DURAGESIC - DOSED MCG/HR) 50 MCG/HR, Place 1 patch (50 mcg total) onto the skin every 3 (three) days., Disp: 10 patch, Rfl: 0 .  Fluticasone-Salmeterol (ADVAIR DISKUS) 250-50 MCG/DOSE AEPB, inhale 1 dose by mouth twice a day, Disp: 60 each, Rfl: 5 .  furosemide (LASIX) 40 MG tablet, Take 40 mg by mouth every other day. , Disp: , Rfl:  .  levofloxacin (LEVAQUIN) 250 MG tablet, Take 1 tablet (250 mg total) by mouth daily for 5 days., Disp: 5 tablet, Rfl: 0 .  LYRICA 300 MG capsule, Take 300 mg by mouth 2 (two) times daily., Disp: , Rfl: 0 .  NARCAN 4 MG/0.1ML LIQD nasal spray kit, Place 0.4 mg into the nose once. , Disp: , Rfl:  .  pantoprazole (PROTONIX) 40 MG tablet, Take 1 tablet (40 mg total) by mouth 2 (two) times daily., Disp: 60 tablet, Rfl: 12 .  potassium chloride (K-DUR) 10 MEQ tablet, Take 1 tablet by mouth daily., Disp: , Rfl:  .  predniSONE (DELTASONE) 10 MG tablet, To 50 mg daily.  Taper by 10 mg daily then stop, Disp: 15 tablet, Rfl: 0 .  silver sulfADIAZINE (SILVADENE) 1 % cream, Apply topically daily., Disp: 50 g, Rfl: 3 .   simvastatin (ZOCOR) 10 MG tablet, Take 10 mg by mouth at bedtime. Reported on 12/04/2015, Disp: , Rfl:  .  tiotropium (SPIRIVA HANDIHALER) 18 MCG inhalation capsule, inhale the contents of one capsule in the handihaler once daily, Disp: 30 capsule, Rfl: 12 .  triamcinolone ointment (KENALOG) 0.5 %, Apply to lesions on hands twice a day as needed, Disp: 30 g, Rfl: 1  Review of Systems  Constitutional: Negative for appetite change, chills, fatigue and fever.  Respiratory: Negative for chest tightness and shortness of breath.   Cardiovascular: Negative for chest pain and palpitations.  Gastrointestinal: Negative for abdominal pain, nausea and vomiting.  Neurological: Negative for dizziness and weakness.    Social History   Tobacco Use  . Smoking status: Current Every Day Smoker    Packs/day: 0.50    Years: 50.00    Pack years: 25.00    Types: Cigarettes  . Smokeless tobacco: Never Used  . Tobacco comment: Previously smoked 2 ppd  Substance Use Topics  . Alcohol use: No    Alcohol/week: 0.0 oz   Objective:   BP 98/64 (BP Location: Right Arm, Patient Position: Sitting, Cuff Size: Small)   Pulse (!) 120   Temp 98.5 F (36.9 C)   Resp (!) 24   SpO2 94% Comment: with 3L of O2    Physical Exam    General Appearance:    Alert, cooperative, no distress  Eyes:    PERRL, conjunctiva/corneas clear, EOM's intact       Lungs:    distant breath sounds, with occasional wheeze, no rales, no rhonchi  Heart:    Regular rate and rhythm  Neurologic:   Awake, alert, oriented x 3. No apparent focal neurological           defect.           Assessment & Plan:     1. Cellulitis of left lower leg  - cefdinir (OMNICEF) 300 MG capsule; Take 1 capsule (300 mg total) by mouth 2 (two) times daily for 7 days.  Dispense: 14 capsule; Refill: 0  2. Pneumonia due to infectious organism, unspecified laterality, unspecified part of lung Resolved,  Call if symptoms change or if not rapidly improving.       Return in about 1 month (around 03/15/2018).       Donald Fisher, MD  [facility]  

## 2018-02-12 NOTE — Telephone Encounter (Signed)
Please advise? Patient has an appt today for hospital follow up.

## 2018-02-12 NOTE — Telephone Encounter (Signed)
OK 

## 2018-02-12 NOTE — Telephone Encounter (Signed)
Needs approval for plan of care for [facility] to see her.

## 2018-02-13 ENCOUNTER — Other Ambulatory Visit: Payer: Self-pay | Admitting: *Deleted

## 2018-02-13 NOTE — Patient Outreach (Signed)
[facility] Care Management  02/13/2018  Cheryl MargaritaMary George [name] 12-09-1950 [id number]  EMMI-General Discharge -Red Alert -Day #1, 02/11/2018 Reason: Gillermina HuUnfilled prescriptions? Yes  Telephone call to patient who was advised of reason for call. Hip pa verification received.  Patient voices that she has not had any problems filling prescriptions.   [facility]. States she was admitted 3/21 after receiving authorization from her insurance company.    She wants to get stronger and return home. Wishes to have automated calls discontinued.  Plan: Have EMMI calls d/c'ed. Close case.   Colleen CanLinda Tyner Codner, RN BSN CCM Care Management Coordinator [facility] Care [facility]  [phone]

## 2018-02-13 NOTE — Telephone Encounter (Signed)
Amy stated that they had to discharge patient, because she went [facility] yesterday. Amy said to just disregard request for now.

## 2018-02-17 DIAGNOSIS — G894 Chronic pain syndrome: Secondary | ICD-10-CM | POA: Diagnosis not present

## 2018-02-17 DIAGNOSIS — J189 Pneumonia, unspecified organism: Secondary | ICD-10-CM | POA: Diagnosis not present

## 2018-02-17 DIAGNOSIS — E785 Hyperlipidemia, unspecified: Secondary | ICD-10-CM | POA: Diagnosis not present

## 2018-02-17 DIAGNOSIS — J449 Chronic obstructive pulmonary disease, unspecified: Secondary | ICD-10-CM | POA: Diagnosis not present

## 2018-02-17 DIAGNOSIS — A419 Sepsis, unspecified organism: Secondary | ICD-10-CM | POA: Diagnosis not present

## 2018-02-17 DIAGNOSIS — R419 Unspecified symptoms and signs involving cognitive functions and awareness: Secondary | ICD-10-CM | POA: Diagnosis not present

## 2018-02-17 DIAGNOSIS — I2510 Atherosclerotic heart disease of native coronary artery without angina pectoris: Secondary | ICD-10-CM | POA: Diagnosis not present

## 2018-02-17 DIAGNOSIS — I10 Essential (primary) hypertension: Secondary | ICD-10-CM | POA: Diagnosis not present

## 2018-02-17 DIAGNOSIS — K219 Gastro-esophageal reflux disease without esophagitis: Secondary | ICD-10-CM | POA: Diagnosis not present

## 2018-02-18 LAB — BLOOD GAS, VENOUS
Acid-base deficit: 0.2 mmol/L (ref 0.0–2.0)
Bicarbonate: 24.8 mmol/L (ref 20.0–28.0)
Patient temperature: 37
pCO2, Ven: 41 mmHg — ABNORMAL LOW (ref 44.0–60.0)
pH, Ven: 7.39 (ref 7.250–7.430)

## 2018-02-20 ENCOUNTER — Ambulatory Visit: Payer: Self-pay | Admitting: Family Medicine

## 2018-02-23 ENCOUNTER — Telehealth: Payer: Self-pay | Admitting: Family Medicine

## 2018-02-23 DIAGNOSIS — J181 Lobar pneumonia, unspecified organism: Secondary | ICD-10-CM | POA: Diagnosis not present

## 2018-02-23 DIAGNOSIS — J441 Chronic obstructive pulmonary disease with (acute) exacerbation: Secondary | ICD-10-CM | POA: Diagnosis not present

## 2018-02-23 DIAGNOSIS — J440 Chronic obstructive pulmonary disease with acute lower respiratory infection: Secondary | ICD-10-CM | POA: Diagnosis not present

## 2018-02-23 DIAGNOSIS — J1008 Influenza due to other identified influenza virus with other specified pneumonia: Secondary | ICD-10-CM | POA: Diagnosis not present

## 2018-02-23 NOTE — Telephone Encounter (Signed)
Please advise 

## 2018-02-23 NOTE — Telephone Encounter (Signed)
Pt's husband is calling requesting a refill of the medication prescribed to the patient for her lips cracking.  States he isn't sure of the name of the medication.    [facility]

## 2018-02-24 MED ORDER — MUPIROCIN 2 % EX OINT: 1.0000 "application " | TOPICAL_OINTMENT | Freq: Two times a day (BID) | CUTANEOUS | 2 refills | Status: DC

## 2018-02-26 ENCOUNTER — Other Ambulatory Visit: Payer: Self-pay | Admitting: Family Medicine

## 2018-02-26 MED ORDER — ESTRADIOL 0.5 MG PO TABS: 0.5000 mg | ORAL_TABLET | Freq: Every day | ORAL | 5 refills | Status: DC

## 2018-02-26 NOTE — Progress Notes (Signed)
Change estropipate to estradiole due to formulary per fax request from [facility].

## 2018-03-04 ENCOUNTER — Telehealth: Payer: Self-pay | Admitting: Family Medicine

## 2018-03-04 DIAGNOSIS — I2510 Atherosclerotic heart disease of native coronary artery without angina pectoris: Secondary | ICD-10-CM | POA: Diagnosis not present

## 2018-03-04 DIAGNOSIS — J1008 Influenza due to other identified influenza virus with other specified pneumonia: Secondary | ICD-10-CM | POA: Diagnosis not present

## 2018-03-04 DIAGNOSIS — G8929 Other chronic pain: Secondary | ICD-10-CM | POA: Diagnosis not present

## 2018-03-04 DIAGNOSIS — J441 Chronic obstructive pulmonary disease with (acute) exacerbation: Secondary | ICD-10-CM | POA: Diagnosis not present

## 2018-03-04 DIAGNOSIS — J440 Chronic obstructive pulmonary disease with acute lower respiratory infection: Secondary | ICD-10-CM | POA: Diagnosis not present

## 2018-03-04 DIAGNOSIS — I739 Peripheral vascular disease, unspecified: Secondary | ICD-10-CM | POA: Diagnosis not present

## 2018-03-04 DIAGNOSIS — M6281 Muscle weakness (generalized): Secondary | ICD-10-CM | POA: Diagnosis not present

## 2018-03-04 DIAGNOSIS — I10 Essential (primary) hypertension: Secondary | ICD-10-CM | POA: Diagnosis not present

## 2018-03-04 DIAGNOSIS — J181 Lobar pneumonia, unspecified organism: Secondary | ICD-10-CM | POA: Diagnosis not present

## 2018-03-04 NOTE — Telephone Encounter (Signed)
That's fine

## 2018-03-04 NOTE — Telephone Encounter (Signed)
Cheryl [name] was advised.

## 2018-03-04 NOTE — Telephone Encounter (Signed)
[name] with [facility] called requesting nursing come out to home to assist meds, teaching ect.  They said she needs 2 times a week for a couple weeks  Cheryl [name]'s cll back is  [phone]  Thanks teri

## 2018-03-04 NOTE — Telephone Encounter (Signed)
Please advise 

## 2018-03-05 ENCOUNTER — Other Ambulatory Visit: Payer: Self-pay

## 2018-03-05 ENCOUNTER — Telehealth: Payer: Self-pay | Admitting: Family Medicine

## 2018-03-05 DIAGNOSIS — I2510 Atherosclerotic heart disease of native coronary artery without angina pectoris: Secondary | ICD-10-CM | POA: Diagnosis not present

## 2018-03-05 DIAGNOSIS — J1008 Influenza due to other identified influenza virus with other specified pneumonia: Secondary | ICD-10-CM | POA: Diagnosis not present

## 2018-03-05 DIAGNOSIS — J441 Chronic obstructive pulmonary disease with (acute) exacerbation: Secondary | ICD-10-CM | POA: Diagnosis not present

## 2018-03-05 DIAGNOSIS — I739 Peripheral vascular disease, unspecified: Secondary | ICD-10-CM | POA: Diagnosis not present

## 2018-03-05 DIAGNOSIS — J440 Chronic obstructive pulmonary disease with acute lower respiratory infection: Secondary | ICD-10-CM | POA: Diagnosis not present

## 2018-03-05 DIAGNOSIS — J181 Lobar pneumonia, unspecified organism: Secondary | ICD-10-CM | POA: Diagnosis not present

## 2018-03-05 DIAGNOSIS — M6281 Muscle weakness (generalized): Secondary | ICD-10-CM | POA: Diagnosis not present

## 2018-03-05 DIAGNOSIS — G8929 Other chronic pain: Secondary | ICD-10-CM | POA: Diagnosis not present

## 2018-03-05 DIAGNOSIS — I10 Essential (primary) hypertension: Secondary | ICD-10-CM | POA: Diagnosis not present

## 2018-03-05 NOTE — Telephone Encounter (Signed)
Please advise 

## 2018-03-05 NOTE — Patient Outreach (Signed)
[facility]) Care Management  03/05/2018  Cheryl [name] [birth year] [id number]  Transition of care  Referral date: 03/03/18 Referral source: [facility]18 Insurance: [employer] insurance  Telephone call to patient regarding transition of care follow up. HIPAA verified with patient. Explained reason for call. Patient states she was in the hospital due to having the flu and pneumonia. Patient states was in the nursing home receiving therapy. Patient states she has support at home from her husband and granddaughter.  Patient states she has a follow up appointment scheduled with her primary MD on 03/09/18.  Patient reports she has all of her medications and is taking them as prescribed.  Patient states she is receiving home physical [facility] and [facility]. Patient states she was unable to do her therapy today because she got sick and threw up.  Patient denies any nausea or vomiting at this time. RNCM advised patient if she continued to feel sick or continued with nausea/ vomiting to contact her doctor. Patient verbalized understanding.  RNCM advised patient to keep her follow up appointments and take her medications as prescribed. Patient declined additional follow up calls or [facility] care management services at this time.  RNCM advised patient to notify MD of any changes in condition prior to scheduled appointment. RNCM provided contact name and number: [phone] or main office number [phone] and 24 hour nurse advise line [phone].  RNCM verified patient aware of 911 services for urgent/ emergent needs.  PLAN: RNCM will close patient due to patient refusing services.  RNCM will send patient [facility] care management brochure as discussed.  RNCM will send closure notification to patients primary MD.   Cheryl Inaavina Jonella Redditt RN,BSN,CCM [facility] Telephonic  [phone]

## 2018-03-05 NOTE — Telephone Encounter (Signed)
[name] with [facility] requesting verbal orders for PT for 1 week 2, 3 week 1, and 2 week 3.

## 2018-03-05 NOTE — Telephone Encounter (Signed)
That's fine

## 2018-03-06 ENCOUNTER — Telehealth: Payer: Self-pay | Admitting: Family Medicine

## 2018-03-06 DIAGNOSIS — I10 Essential (primary) hypertension: Secondary | ICD-10-CM | POA: Diagnosis not present

## 2018-03-06 DIAGNOSIS — J181 Lobar pneumonia, unspecified organism: Secondary | ICD-10-CM | POA: Diagnosis not present

## 2018-03-06 DIAGNOSIS — M6281 Muscle weakness (generalized): Secondary | ICD-10-CM | POA: Diagnosis not present

## 2018-03-06 DIAGNOSIS — I739 Peripheral vascular disease, unspecified: Secondary | ICD-10-CM | POA: Diagnosis not present

## 2018-03-06 DIAGNOSIS — J440 Chronic obstructive pulmonary disease with acute lower respiratory infection: Secondary | ICD-10-CM | POA: Diagnosis not present

## 2018-03-06 DIAGNOSIS — J441 Chronic obstructive pulmonary disease with (acute) exacerbation: Secondary | ICD-10-CM | POA: Diagnosis not present

## 2018-03-06 DIAGNOSIS — G8929 Other chronic pain: Secondary | ICD-10-CM | POA: Diagnosis not present

## 2018-03-06 DIAGNOSIS — J1008 Influenza due to other identified influenza virus with other specified pneumonia: Secondary | ICD-10-CM | POA: Diagnosis not present

## 2018-03-06 DIAGNOSIS — I2510 Atherosclerotic heart disease of native coronary artery without angina pectoris: Secondary | ICD-10-CM | POA: Diagnosis not present

## 2018-03-06 NOTE — Telephone Encounter (Signed)
OK to order flutter valve for pneumonia, J18.9

## 2018-03-06 NOTE — Telephone Encounter (Signed)
Please advise 

## 2018-03-06 NOTE — Telephone Encounter (Signed)
Cheryl [name], where is the phone number to call Thayer OhmChris back?

## 2018-03-06 NOTE — Telephone Encounter (Signed)
Lupita Leashonna with [facility] called asking if they can get a prescription for flutter valve.  They will need ICD 10 code to go with it.  She said pt needs this for the pneumonia and her lungs.  Their call back is [phone]  You can either call or fax the order to [phone]  Thanks teri

## 2018-03-09 ENCOUNTER — Ambulatory Visit (INDEPENDENT_AMBULATORY_CARE_PROVIDER_SITE_OTHER): Payer: Medicare HMO | Admitting: Family Medicine

## 2018-03-09 ENCOUNTER — Encounter: Payer: Self-pay | Admitting: Family Medicine

## 2018-03-09 VITALS — BP 110/54 | HR 76 | Temp 98.2°F | Resp 16 | Wt 95.0 lb

## 2018-03-09 DIAGNOSIS — R059 Cough, unspecified: Secondary | ICD-10-CM

## 2018-03-09 DIAGNOSIS — L03116 Cellulitis of left lower limb: Secondary | ICD-10-CM

## 2018-03-09 DIAGNOSIS — R05 Cough: Secondary | ICD-10-CM | POA: Diagnosis not present

## 2018-03-09 MED ORDER — PREDNISONE 10 MG PO TABS: ORAL_TABLET | ORAL | 0 refills | Status: AC

## 2018-03-09 MED ORDER — LEVOFLOXACIN 750 MG PO TABS: 750.0000 mg | ORAL_TABLET | Freq: Every day | ORAL | 0 refills | Status: DC

## 2018-03-09 NOTE — Telephone Encounter (Signed)
Order faxed.

## 2018-03-09 NOTE — Progress Notes (Signed)
Patient: Cheryl [name] Female    DOB: 06-14-1951   67 y.o.   MRN: [id number] Visit Date: 03/09/2018  Today's Provider: Lelon Huh, MD   No chief complaint on file.  Subjective:    HPI  Transition of Care: Patient was recently discharged from [employer] [facility] on 03/02/18. Patient was previously in the hospital 02/05/18 through 3/17 for flu and pneumonia . She was discharged with home health who subsequently got her placed at [facility] on 3/20.Marland Kitchen Patient was last seen here in office on 02/12/2018 for cellulitis of left lower leg and pneumonia. Rx for [facility] prescribed. She reports the redness in her leg has completely resolved. She is now home and has started PT and OT. She does report she has been a little more short of breath over the last week and feels more congested in her chest. Has been using her O2 more frequently. No fevers or chills.     Allergies  Allergen Reactions  . Percocet [Oxycodone-Acetaminophen] Hives and Rash  . Aspirin Nausea And Vomiting and Other (See Comments)    Reaction:  GI upset   . Codeine Nausea And Vomiting, Other (See Comments) and Nausea Only    Reaction:  GI upset   . Propoxyphene Other (See Comments) and Nausea Only    GI upset Reaction:  GI upset   . Sulfa Antibiotics Rash and Other (See Comments)    Reaction:  GI upset      Current Outpatient Medications:  .  acetaminophen (TYLENOL) 325 MG tablet, Take 2 tablets (650 mg total) by mouth every 6 (six) hours as needed for mild pain (or Fever >/= 101)., Disp: 30 tablet, Rfl: 0 .  albuterol (PROVENTIL HFA;VENTOLIN HFA) 108 (90 Base) MCG/ACT inhaler, Inhale 2 puffs into the lungs every 4 (four) hours as needed for wheezing or shortness of breath., Disp: 18 g, Rfl: 3 .  albuterol (PROVENTIL) (2.5 MG/3ML) 0.083% nebulizer solution, Take 3 mLs (2.5 mg total) by nebulization every 4 (four) hours as needed for wheezing., Disp: 50 vial, Rfl: 12 .  alendronate (FOSAMAX) 70 MG tablet,  Take 1 tablet (70 mg total) by mouth every 7 (seven) days. Take with a full glass of water on an empty stomach., Disp: 4 tablet, Rfl: 11 .  ALPRAZolam (XANAX) 0.5 MG tablet, take 1 tablet by mouth three times a day if needed, Disp: 60 tablet, Rfl: 5 .  celecoxib (CELEBREX) 100 MG capsule, Take 1 capsule (100 mg total) by mouth 2 (two) times daily as needed., Disp: 60 capsule, Rfl: 5 .  clopidogrel (PLAVIX) 75 MG tablet, Take 75 mg by mouth daily., Disp: , Rfl:  .  cyclobenzaprine (FLEXERIL) 5 MG tablet, Take 1 tablet (5 mg total) by mouth 3 (three) times daily as needed for muscle spasms., Disp: 30 tablet, Rfl: 5 .  diphenoxylate-atropine (LOMOTIL) 2.5-0.025 MG per tablet, Take 2 tablets by mouth 4 (four) times daily as needed for diarrhea or loose stools. Reported on 03/11/2016, Disp: , Rfl:  .  donepezil (ARICEPT) 10 MG tablet, Take 10 mg by mouth at bedtime., Disp: , Rfl:  .  DULoxetine (CYMBALTA) 30 MG capsule, Take 1 capsule by mouth daily., Disp: , Rfl:  .  estradiol (ESTRACE) 0.5 MG tablet, Take 1 tablet (0.5 mg total) by mouth daily. (Take in place of estropipate), Disp: 30 tablet, Rfl: 5 .  [START ON 04/22/2018] fentaNYL (DURAGESIC - DOSED MCG/HR) 50 MCG/HR, Place 1 patch (50 mcg total) onto  the skin every 3 (three) days., Disp: 10 patch, Rfl: 0 .  Fluticasone-Salmeterol (ADVAIR DISKUS) 250-50 MCG/DOSE AEPB, inhale 1 dose by mouth twice a day, Disp: 60 each, Rfl: 5 .  furosemide (LASIX) 40 MG tablet, Take 40 mg by mouth every other day. , Disp: , Rfl:  .  LYRICA 300 MG capsule, Take 300 mg by mouth 2 (two) times daily., Disp: , Rfl: 0 .  mupirocin ointment (BACTROBAN) 2 %, Apply 1 application topically 2 (two) times daily., Disp: 15 g, Rfl: 2 .  NARCAN 4 MG/0.1ML LIQD nasal spray kit, Place 0.4 mg into the nose once. , Disp: , Rfl:  .  pantoprazole (PROTONIX) 40 MG tablet, Take 1 tablet (40 mg total) by mouth 2 (two) times daily., Disp: 60 tablet, Rfl: 12 .  potassium chloride (K-DUR) 10 MEQ  tablet, Take 1 tablet by mouth daily., Disp: , Rfl:  .  silver sulfADIAZINE (SILVADENE) 1 % cream, Apply topically daily., Disp: 50 g, Rfl: 3 .  simvastatin (ZOCOR) 10 MG tablet, Take 10 mg by mouth at bedtime. Reported on 12/04/2015, Disp: , Rfl:  .  tiotropium (SPIRIVA HANDIHALER) 18 MCG inhalation capsule, inhale the contents of one capsule in the handihaler once daily, Disp: 30 capsule, Rfl: 12 .  triamcinolone ointment (KENALOG) 0.5 %, Apply to lesions on hands twice a day as needed, Disp: 30 g, Rfl: 1 .  predniSONE (DELTASONE) 10 MG tablet, To 50 mg daily.  Taper by 10 mg daily then stop (Patient not taking: Reported on 03/09/2018), Disp: 15 tablet, Rfl: 0  Review of Systems  Constitutional: Negative for appetite change, chills, fatigue and fever.  Respiratory: Negative for chest tightness and shortness of breath.   Cardiovascular: Negative for chest pain and palpitations.  Gastrointestinal: Negative for abdominal pain, nausea and vomiting.  Neurological: Negative for dizziness and weakness.    Social History   Tobacco Use  . Smoking status: Current Every Day Smoker    Packs/day: 0.50    Years: 50.00    Pack years: 25.00    Types: Cigarettes  . Smokeless tobacco: Never Used  . Tobacco comment: Previously smoked 2 ppd  Substance Use Topics  . Alcohol use: No    Alcohol/week: 0.0 oz   Objective:   BP (!) 110/54 (BP Location: Right Arm, Patient Position: Sitting, Cuff Size: Normal)   Pulse 76   Temp 98.2 F (36.8 C) (Oral)   Resp 16   Wt 95 lb (43.1 kg)   SpO2 95%   BMI 19.86 kg/m  Vitals:   03/09/18 1029  BP: (!) 110/54  Pulse: 76  Resp: 16  Temp: 98.2 F (36.8 C)  TempSrc: Oral  SpO2: 95% (room air)  Weight: 95 lb (43.1 kg)     Physical Exam   General Appearance:    Alert, cooperative, no distress  Eyes:    PERRL, conjunctiva/corneas clear, EOM's intact       Lungs:     Scattered expiratory wheezes and rhonchi, no rales.   Heart:    Regular rate and rhythm    Neurologic:   Awake, alert, oriented x 3. No apparent focal neurological           defect.   Ext:   No wounds or erythema of either lower leg. Erythema from previous visit resolved.        Assessment & Plan:     1. Cellulitis of left lower leg Resolved.   2. Cough Consider history of frequent COPD  exacerbations and pneumonia, will treat aggressively which will hopefull prevent another hospitalization. She is to call if not rapidly improving over the next couple of days.  - predniSONE (DELTASONE) 10 MG tablet; 6 tablets for 1 day, then 5 for 1 day, then 4 for 1 day, then 3 for 1 day, then 2 for 1 day then 1 for 1 day.  Dispense: 21 tablet; Refill: 0 - levofloxacin (LEVAQUIN) 750 MG tablet; Take 1 tablet (750 mg total) by mouth daily.  Dispense: 7 tablet; Refill: 0       Lelon Huh, MD  [facility]

## 2018-03-10 ENCOUNTER — Telehealth: Payer: Self-pay | Admitting: Family Medicine

## 2018-03-10 DIAGNOSIS — J189 Pneumonia, unspecified organism: Secondary | ICD-10-CM | POA: Diagnosis not present

## 2018-03-10 DIAGNOSIS — M6281 Muscle weakness (generalized): Secondary | ICD-10-CM | POA: Diagnosis not present

## 2018-03-10 DIAGNOSIS — I10 Essential (primary) hypertension: Secondary | ICD-10-CM | POA: Diagnosis not present

## 2018-03-10 DIAGNOSIS — J1008 Influenza due to other identified influenza virus with other specified pneumonia: Secondary | ICD-10-CM | POA: Diagnosis not present

## 2018-03-10 DIAGNOSIS — I2510 Atherosclerotic heart disease of native coronary artery without angina pectoris: Secondary | ICD-10-CM | POA: Diagnosis not present

## 2018-03-10 DIAGNOSIS — J440 Chronic obstructive pulmonary disease with acute lower respiratory infection: Secondary | ICD-10-CM | POA: Diagnosis not present

## 2018-03-10 DIAGNOSIS — J449 Chronic obstructive pulmonary disease, unspecified: Secondary | ICD-10-CM | POA: Diagnosis not present

## 2018-03-10 DIAGNOSIS — I739 Peripheral vascular disease, unspecified: Secondary | ICD-10-CM | POA: Diagnosis not present

## 2018-03-10 DIAGNOSIS — G8929 Other chronic pain: Secondary | ICD-10-CM | POA: Diagnosis not present

## 2018-03-10 DIAGNOSIS — J441 Chronic obstructive pulmonary disease with (acute) exacerbation: Secondary | ICD-10-CM | POA: Diagnosis not present

## 2018-03-10 DIAGNOSIS — J181 Lobar pneumonia, unspecified organism: Secondary | ICD-10-CM | POA: Diagnosis not present

## 2018-03-10 NOTE — Telephone Encounter (Signed)
Please advise 

## 2018-03-10 NOTE — Telephone Encounter (Signed)
Thayer OhmChris was advised.

## 2018-03-10 NOTE — Telephone Encounter (Signed)
That's fine

## 2018-03-10 NOTE — Telephone Encounter (Signed)
[facility] with [facility] is calling to get verbal order for two times a week for five weeks starting next week for PT.

## 2018-03-12 ENCOUNTER — Telehealth: Payer: Self-pay | Admitting: Family Medicine

## 2018-03-12 DIAGNOSIS — I739 Peripheral vascular disease, unspecified: Secondary | ICD-10-CM | POA: Diagnosis not present

## 2018-03-12 DIAGNOSIS — M6281 Muscle weakness (generalized): Secondary | ICD-10-CM | POA: Diagnosis not present

## 2018-03-12 DIAGNOSIS — G8929 Other chronic pain: Secondary | ICD-10-CM | POA: Diagnosis not present

## 2018-03-12 DIAGNOSIS — J181 Lobar pneumonia, unspecified organism: Secondary | ICD-10-CM | POA: Diagnosis not present

## 2018-03-12 DIAGNOSIS — J1008 Influenza due to other identified influenza virus with other specified pneumonia: Secondary | ICD-10-CM | POA: Diagnosis not present

## 2018-03-12 DIAGNOSIS — J441 Chronic obstructive pulmonary disease with (acute) exacerbation: Secondary | ICD-10-CM | POA: Diagnosis not present

## 2018-03-12 DIAGNOSIS — I2510 Atherosclerotic heart disease of native coronary artery without angina pectoris: Secondary | ICD-10-CM | POA: Diagnosis not present

## 2018-03-12 DIAGNOSIS — I10 Essential (primary) hypertension: Secondary | ICD-10-CM | POA: Diagnosis not present

## 2018-03-12 DIAGNOSIS — J440 Chronic obstructive pulmonary disease with acute lower respiratory infection: Secondary | ICD-10-CM | POA: Diagnosis not present

## 2018-03-12 NOTE — Telephone Encounter (Signed)
[facility] calling to let us know pt has declined the OT evaluation

## 2018-03-14 DIAGNOSIS — J449 Chronic obstructive pulmonary disease, unspecified: Secondary | ICD-10-CM | POA: Diagnosis not present

## 2018-03-16 DIAGNOSIS — J440 Chronic obstructive pulmonary disease with acute lower respiratory infection: Secondary | ICD-10-CM | POA: Diagnosis not present

## 2018-03-16 DIAGNOSIS — M6281 Muscle weakness (generalized): Secondary | ICD-10-CM | POA: Diagnosis not present

## 2018-03-16 DIAGNOSIS — I2510 Atherosclerotic heart disease of native coronary artery without angina pectoris: Secondary | ICD-10-CM | POA: Diagnosis not present

## 2018-03-16 DIAGNOSIS — J1008 Influenza due to other identified influenza virus with other specified pneumonia: Secondary | ICD-10-CM | POA: Diagnosis not present

## 2018-03-16 DIAGNOSIS — J181 Lobar pneumonia, unspecified organism: Secondary | ICD-10-CM | POA: Diagnosis not present

## 2018-03-16 DIAGNOSIS — G8929 Other chronic pain: Secondary | ICD-10-CM | POA: Diagnosis not present

## 2018-03-16 DIAGNOSIS — J441 Chronic obstructive pulmonary disease with (acute) exacerbation: Secondary | ICD-10-CM | POA: Diagnosis not present

## 2018-03-16 DIAGNOSIS — I10 Essential (primary) hypertension: Secondary | ICD-10-CM | POA: Diagnosis not present

## 2018-03-16 DIAGNOSIS — I739 Peripheral vascular disease, unspecified: Secondary | ICD-10-CM | POA: Diagnosis not present

## 2018-03-16 DIAGNOSIS — J449 Chronic obstructive pulmonary disease, unspecified: Secondary | ICD-10-CM | POA: Diagnosis not present

## 2018-03-17 ENCOUNTER — Other Ambulatory Visit: Payer: Self-pay | Admitting: Family Medicine

## 2018-03-17 DIAGNOSIS — I739 Peripheral vascular disease, unspecified: Secondary | ICD-10-CM | POA: Diagnosis not present

## 2018-03-17 DIAGNOSIS — G8929 Other chronic pain: Secondary | ICD-10-CM | POA: Diagnosis not present

## 2018-03-17 DIAGNOSIS — I2510 Atherosclerotic heart disease of native coronary artery without angina pectoris: Secondary | ICD-10-CM | POA: Diagnosis not present

## 2018-03-17 DIAGNOSIS — J441 Chronic obstructive pulmonary disease with (acute) exacerbation: Secondary | ICD-10-CM | POA: Diagnosis not present

## 2018-03-17 DIAGNOSIS — I10 Essential (primary) hypertension: Secondary | ICD-10-CM | POA: Diagnosis not present

## 2018-03-17 DIAGNOSIS — J1008 Influenza due to other identified influenza virus with other specified pneumonia: Secondary | ICD-10-CM | POA: Diagnosis not present

## 2018-03-17 DIAGNOSIS — M6281 Muscle weakness (generalized): Secondary | ICD-10-CM | POA: Diagnosis not present

## 2018-03-17 DIAGNOSIS — J440 Chronic obstructive pulmonary disease with acute lower respiratory infection: Secondary | ICD-10-CM | POA: Diagnosis not present

## 2018-03-17 DIAGNOSIS — J181 Lobar pneumonia, unspecified organism: Secondary | ICD-10-CM | POA: Diagnosis not present

## 2018-03-19 DIAGNOSIS — J1008 Influenza due to other identified influenza virus with other specified pneumonia: Secondary | ICD-10-CM | POA: Diagnosis not present

## 2018-03-19 DIAGNOSIS — J181 Lobar pneumonia, unspecified organism: Secondary | ICD-10-CM | POA: Diagnosis not present

## 2018-03-19 DIAGNOSIS — M6281 Muscle weakness (generalized): Secondary | ICD-10-CM | POA: Diagnosis not present

## 2018-03-19 DIAGNOSIS — J441 Chronic obstructive pulmonary disease with (acute) exacerbation: Secondary | ICD-10-CM | POA: Diagnosis not present

## 2018-03-19 DIAGNOSIS — G8929 Other chronic pain: Secondary | ICD-10-CM | POA: Diagnosis not present

## 2018-03-19 DIAGNOSIS — I739 Peripheral vascular disease, unspecified: Secondary | ICD-10-CM | POA: Diagnosis not present

## 2018-03-19 DIAGNOSIS — I10 Essential (primary) hypertension: Secondary | ICD-10-CM | POA: Diagnosis not present

## 2018-03-19 DIAGNOSIS — J440 Chronic obstructive pulmonary disease with acute lower respiratory infection: Secondary | ICD-10-CM | POA: Diagnosis not present

## 2018-03-19 DIAGNOSIS — I2510 Atherosclerotic heart disease of native coronary artery without angina pectoris: Secondary | ICD-10-CM | POA: Diagnosis not present

## 2018-03-24 DIAGNOSIS — G8929 Other chronic pain: Secondary | ICD-10-CM | POA: Diagnosis not present

## 2018-03-24 DIAGNOSIS — J1008 Influenza due to other identified influenza virus with other specified pneumonia: Secondary | ICD-10-CM | POA: Diagnosis not present

## 2018-03-24 DIAGNOSIS — J440 Chronic obstructive pulmonary disease with acute lower respiratory infection: Secondary | ICD-10-CM | POA: Diagnosis not present

## 2018-03-24 DIAGNOSIS — M6281 Muscle weakness (generalized): Secondary | ICD-10-CM | POA: Diagnosis not present

## 2018-03-24 DIAGNOSIS — J441 Chronic obstructive pulmonary disease with (acute) exacerbation: Secondary | ICD-10-CM | POA: Diagnosis not present

## 2018-03-24 DIAGNOSIS — I10 Essential (primary) hypertension: Secondary | ICD-10-CM | POA: Diagnosis not present

## 2018-03-24 DIAGNOSIS — I2510 Atherosclerotic heart disease of native coronary artery without angina pectoris: Secondary | ICD-10-CM | POA: Diagnosis not present

## 2018-03-24 DIAGNOSIS — J181 Lobar pneumonia, unspecified organism: Secondary | ICD-10-CM | POA: Diagnosis not present

## 2018-03-24 DIAGNOSIS — I739 Peripheral vascular disease, unspecified: Secondary | ICD-10-CM | POA: Diagnosis not present

## 2018-03-26 DIAGNOSIS — G8929 Other chronic pain: Secondary | ICD-10-CM | POA: Diagnosis not present

## 2018-03-26 DIAGNOSIS — J1008 Influenza due to other identified influenza virus with other specified pneumonia: Secondary | ICD-10-CM | POA: Diagnosis not present

## 2018-03-26 DIAGNOSIS — J440 Chronic obstructive pulmonary disease with acute lower respiratory infection: Secondary | ICD-10-CM | POA: Diagnosis not present

## 2018-03-26 DIAGNOSIS — I2510 Atherosclerotic heart disease of native coronary artery without angina pectoris: Secondary | ICD-10-CM | POA: Diagnosis not present

## 2018-03-26 DIAGNOSIS — I10 Essential (primary) hypertension: Secondary | ICD-10-CM | POA: Diagnosis not present

## 2018-03-26 DIAGNOSIS — M6281 Muscle weakness (generalized): Secondary | ICD-10-CM | POA: Diagnosis not present

## 2018-03-26 DIAGNOSIS — J441 Chronic obstructive pulmonary disease with (acute) exacerbation: Secondary | ICD-10-CM | POA: Diagnosis not present

## 2018-03-26 DIAGNOSIS — J181 Lobar pneumonia, unspecified organism: Secondary | ICD-10-CM | POA: Diagnosis not present

## 2018-03-26 DIAGNOSIS — I739 Peripheral vascular disease, unspecified: Secondary | ICD-10-CM | POA: Diagnosis not present

## 2018-03-27 DIAGNOSIS — I10 Essential (primary) hypertension: Secondary | ICD-10-CM | POA: Diagnosis not present

## 2018-03-27 DIAGNOSIS — I2510 Atherosclerotic heart disease of native coronary artery without angina pectoris: Secondary | ICD-10-CM | POA: Diagnosis not present

## 2018-03-27 DIAGNOSIS — G8929 Other chronic pain: Secondary | ICD-10-CM | POA: Diagnosis not present

## 2018-03-27 DIAGNOSIS — J1008 Influenza due to other identified influenza virus with other specified pneumonia: Secondary | ICD-10-CM | POA: Diagnosis not present

## 2018-03-27 DIAGNOSIS — J440 Chronic obstructive pulmonary disease with acute lower respiratory infection: Secondary | ICD-10-CM | POA: Diagnosis not present

## 2018-03-27 DIAGNOSIS — I739 Peripheral vascular disease, unspecified: Secondary | ICD-10-CM | POA: Diagnosis not present

## 2018-03-27 DIAGNOSIS — J181 Lobar pneumonia, unspecified organism: Secondary | ICD-10-CM | POA: Diagnosis not present

## 2018-03-27 DIAGNOSIS — J441 Chronic obstructive pulmonary disease with (acute) exacerbation: Secondary | ICD-10-CM | POA: Diagnosis not present

## 2018-03-27 DIAGNOSIS — M6281 Muscle weakness (generalized): Secondary | ICD-10-CM | POA: Diagnosis not present

## 2018-03-31 DIAGNOSIS — J440 Chronic obstructive pulmonary disease with acute lower respiratory infection: Secondary | ICD-10-CM | POA: Diagnosis not present

## 2018-03-31 DIAGNOSIS — G8929 Other chronic pain: Secondary | ICD-10-CM | POA: Diagnosis not present

## 2018-03-31 DIAGNOSIS — I2510 Atherosclerotic heart disease of native coronary artery without angina pectoris: Secondary | ICD-10-CM | POA: Diagnosis not present

## 2018-03-31 DIAGNOSIS — M6281 Muscle weakness (generalized): Secondary | ICD-10-CM | POA: Diagnosis not present

## 2018-03-31 DIAGNOSIS — I10 Essential (primary) hypertension: Secondary | ICD-10-CM | POA: Diagnosis not present

## 2018-03-31 DIAGNOSIS — J1008 Influenza due to other identified influenza virus with other specified pneumonia: Secondary | ICD-10-CM | POA: Diagnosis not present

## 2018-03-31 DIAGNOSIS — I739 Peripheral vascular disease, unspecified: Secondary | ICD-10-CM | POA: Diagnosis not present

## 2018-03-31 DIAGNOSIS — J441 Chronic obstructive pulmonary disease with (acute) exacerbation: Secondary | ICD-10-CM | POA: Diagnosis not present

## 2018-03-31 DIAGNOSIS — J181 Lobar pneumonia, unspecified organism: Secondary | ICD-10-CM | POA: Diagnosis not present

## 2018-04-01 DIAGNOSIS — G8929 Other chronic pain: Secondary | ICD-10-CM | POA: Diagnosis not present

## 2018-04-01 DIAGNOSIS — I10 Essential (primary) hypertension: Secondary | ICD-10-CM | POA: Diagnosis not present

## 2018-04-01 DIAGNOSIS — J1008 Influenza due to other identified influenza virus with other specified pneumonia: Secondary | ICD-10-CM | POA: Diagnosis not present

## 2018-04-01 DIAGNOSIS — M6281 Muscle weakness (generalized): Secondary | ICD-10-CM | POA: Diagnosis not present

## 2018-04-01 DIAGNOSIS — J440 Chronic obstructive pulmonary disease with acute lower respiratory infection: Secondary | ICD-10-CM | POA: Diagnosis not present

## 2018-04-01 DIAGNOSIS — J441 Chronic obstructive pulmonary disease with (acute) exacerbation: Secondary | ICD-10-CM | POA: Diagnosis not present

## 2018-04-01 DIAGNOSIS — I739 Peripheral vascular disease, unspecified: Secondary | ICD-10-CM | POA: Diagnosis not present

## 2018-04-01 DIAGNOSIS — I2510 Atherosclerotic heart disease of native coronary artery without angina pectoris: Secondary | ICD-10-CM | POA: Diagnosis not present

## 2018-04-01 DIAGNOSIS — J181 Lobar pneumonia, unspecified organism: Secondary | ICD-10-CM | POA: Diagnosis not present

## 2018-04-03 DIAGNOSIS — M6281 Muscle weakness (generalized): Secondary | ICD-10-CM | POA: Diagnosis not present

## 2018-04-03 DIAGNOSIS — J440 Chronic obstructive pulmonary disease with acute lower respiratory infection: Secondary | ICD-10-CM | POA: Diagnosis not present

## 2018-04-03 DIAGNOSIS — G8929 Other chronic pain: Secondary | ICD-10-CM | POA: Diagnosis not present

## 2018-04-03 DIAGNOSIS — J1008 Influenza due to other identified influenza virus with other specified pneumonia: Secondary | ICD-10-CM | POA: Diagnosis not present

## 2018-04-03 DIAGNOSIS — J441 Chronic obstructive pulmonary disease with (acute) exacerbation: Secondary | ICD-10-CM | POA: Diagnosis not present

## 2018-04-03 DIAGNOSIS — I739 Peripheral vascular disease, unspecified: Secondary | ICD-10-CM | POA: Diagnosis not present

## 2018-04-03 DIAGNOSIS — J181 Lobar pneumonia, unspecified organism: Secondary | ICD-10-CM | POA: Diagnosis not present

## 2018-04-03 DIAGNOSIS — I2510 Atherosclerotic heart disease of native coronary artery without angina pectoris: Secondary | ICD-10-CM | POA: Diagnosis not present

## 2018-04-03 DIAGNOSIS — I10 Essential (primary) hypertension: Secondary | ICD-10-CM | POA: Diagnosis not present

## 2018-04-07 ENCOUNTER — Telehealth: Payer: Self-pay

## 2018-04-07 DIAGNOSIS — I10 Essential (primary) hypertension: Secondary | ICD-10-CM | POA: Diagnosis not present

## 2018-04-07 DIAGNOSIS — J440 Chronic obstructive pulmonary disease with acute lower respiratory infection: Secondary | ICD-10-CM | POA: Diagnosis not present

## 2018-04-07 DIAGNOSIS — J181 Lobar pneumonia, unspecified organism: Secondary | ICD-10-CM | POA: Diagnosis not present

## 2018-04-07 DIAGNOSIS — G8929 Other chronic pain: Secondary | ICD-10-CM | POA: Diagnosis not present

## 2018-04-07 DIAGNOSIS — J1008 Influenza due to other identified influenza virus with other specified pneumonia: Secondary | ICD-10-CM | POA: Diagnosis not present

## 2018-04-07 DIAGNOSIS — M6281 Muscle weakness (generalized): Secondary | ICD-10-CM | POA: Diagnosis not present

## 2018-04-07 DIAGNOSIS — I739 Peripheral vascular disease, unspecified: Secondary | ICD-10-CM | POA: Diagnosis not present

## 2018-04-07 DIAGNOSIS — J441 Chronic obstructive pulmonary disease with (acute) exacerbation: Secondary | ICD-10-CM | POA: Diagnosis not present

## 2018-04-07 DIAGNOSIS — I2510 Atherosclerotic heart disease of native coronary artery without angina pectoris: Secondary | ICD-10-CM | POA: Diagnosis not present

## 2018-04-07 NOTE — Telephone Encounter (Signed)
That's fine

## 2018-04-07 NOTE — Telephone Encounter (Signed)
Cheryl [name] was given verbal order.

## 2018-04-07 NOTE — Telephone Encounter (Signed)
Please advise 

## 2018-04-07 NOTE — Telephone Encounter (Signed)
Chris from PT wants order for  PT 2 times weekly for 8 weeks, starting next week.  Call back number for Thayer Ohm is [phone]  Also wanted to let you know she fell yesterday in her bathroom, hurt her leg but is ok now.

## 2018-04-08 ENCOUNTER — Other Ambulatory Visit: Payer: Self-pay | Admitting: Family Medicine

## 2018-04-09 ENCOUNTER — Telehealth: Payer: Self-pay | Admitting: Family Medicine

## 2018-04-09 DIAGNOSIS — I2510 Atherosclerotic heart disease of native coronary artery without angina pectoris: Secondary | ICD-10-CM | POA: Diagnosis not present

## 2018-04-09 DIAGNOSIS — J440 Chronic obstructive pulmonary disease with acute lower respiratory infection: Secondary | ICD-10-CM | POA: Diagnosis not present

## 2018-04-09 DIAGNOSIS — J441 Chronic obstructive pulmonary disease with (acute) exacerbation: Secondary | ICD-10-CM | POA: Diagnosis not present

## 2018-04-09 DIAGNOSIS — J1008 Influenza due to other identified influenza virus with other specified pneumonia: Secondary | ICD-10-CM | POA: Diagnosis not present

## 2018-04-09 DIAGNOSIS — I739 Peripheral vascular disease, unspecified: Secondary | ICD-10-CM | POA: Diagnosis not present

## 2018-04-09 DIAGNOSIS — M6281 Muscle weakness (generalized): Secondary | ICD-10-CM | POA: Diagnosis not present

## 2018-04-09 DIAGNOSIS — I10 Essential (primary) hypertension: Secondary | ICD-10-CM | POA: Diagnosis not present

## 2018-04-09 DIAGNOSIS — J181 Lobar pneumonia, unspecified organism: Secondary | ICD-10-CM | POA: Diagnosis not present

## 2018-04-09 DIAGNOSIS — G8929 Other chronic pain: Secondary | ICD-10-CM | POA: Diagnosis not present

## 2018-04-09 NOTE — Telephone Encounter (Signed)
That's fine

## 2018-04-09 NOTE — Telephone Encounter (Signed)
[name] with [facility] called wanting a verbal to continue nurse care once a week to help with medicatons  [name] also noted today that pt's left calf is red and swollen no pain or heat when touched.  Please advise  Thanks teri

## 2018-04-09 NOTE — Telephone Encounter (Signed)
Please advise 

## 2018-04-10 DIAGNOSIS — G8929 Other chronic pain: Secondary | ICD-10-CM | POA: Diagnosis not present

## 2018-04-10 DIAGNOSIS — M6281 Muscle weakness (generalized): Secondary | ICD-10-CM | POA: Diagnosis not present

## 2018-04-10 DIAGNOSIS — I739 Peripheral vascular disease, unspecified: Secondary | ICD-10-CM | POA: Diagnosis not present

## 2018-04-10 DIAGNOSIS — J1008 Influenza due to other identified influenza virus with other specified pneumonia: Secondary | ICD-10-CM | POA: Diagnosis not present

## 2018-04-10 DIAGNOSIS — I2510 Atherosclerotic heart disease of native coronary artery without angina pectoris: Secondary | ICD-10-CM | POA: Diagnosis not present

## 2018-04-10 DIAGNOSIS — J181 Lobar pneumonia, unspecified organism: Secondary | ICD-10-CM | POA: Diagnosis not present

## 2018-04-10 DIAGNOSIS — J441 Chronic obstructive pulmonary disease with (acute) exacerbation: Secondary | ICD-10-CM | POA: Diagnosis not present

## 2018-04-10 DIAGNOSIS — I10 Essential (primary) hypertension: Secondary | ICD-10-CM | POA: Diagnosis not present

## 2018-04-10 DIAGNOSIS — J440 Chronic obstructive pulmonary disease with acute lower respiratory infection: Secondary | ICD-10-CM | POA: Diagnosis not present

## 2018-04-10 NOTE — Telephone Encounter (Signed)
Verbal given to [facility] per Dr Sherrie Mustache ok.

## 2018-04-13 ENCOUNTER — Ambulatory Visit (INDEPENDENT_AMBULATORY_CARE_PROVIDER_SITE_OTHER): Payer: Medicare HMO | Admitting: Family Medicine

## 2018-04-13 ENCOUNTER — Encounter: Payer: Self-pay | Admitting: Family Medicine

## 2018-04-13 VITALS — BP 100/60 | HR 80 | Temp 97.5°F | Resp 16 | Ht 59.0 in | Wt 105.0 lb

## 2018-04-13 DIAGNOSIS — G8929 Other chronic pain: Secondary | ICD-10-CM | POA: Diagnosis not present

## 2018-04-13 DIAGNOSIS — R600 Localized edema: Secondary | ICD-10-CM

## 2018-04-13 DIAGNOSIS — J449 Chronic obstructive pulmonary disease, unspecified: Secondary | ICD-10-CM | POA: Diagnosis not present

## 2018-04-13 DIAGNOSIS — L03116 Cellulitis of left lower limb: Secondary | ICD-10-CM

## 2018-04-13 DIAGNOSIS — F17210 Nicotine dependence, cigarettes, uncomplicated: Secondary | ICD-10-CM | POA: Diagnosis not present

## 2018-04-13 DIAGNOSIS — G609 Hereditary and idiopathic neuropathy, unspecified: Secondary | ICD-10-CM | POA: Diagnosis not present

## 2018-04-13 DIAGNOSIS — L03115 Cellulitis of right lower limb: Secondary | ICD-10-CM

## 2018-04-13 DIAGNOSIS — I10 Essential (primary) hypertension: Secondary | ICD-10-CM | POA: Diagnosis not present

## 2018-04-13 DIAGNOSIS — J441 Chronic obstructive pulmonary disease with (acute) exacerbation: Secondary | ICD-10-CM | POA: Diagnosis not present

## 2018-04-13 DIAGNOSIS — I2510 Atherosclerotic heart disease of native coronary artery without angina pectoris: Secondary | ICD-10-CM | POA: Diagnosis not present

## 2018-04-13 DIAGNOSIS — M4180 Other forms of scoliosis, site unspecified: Secondary | ICD-10-CM | POA: Diagnosis not present

## 2018-04-13 DIAGNOSIS — I739 Peripheral vascular disease, unspecified: Secondary | ICD-10-CM | POA: Diagnosis not present

## 2018-04-13 MED ORDER — CEFDINIR 300 MG PO CAPS: 300.0000 mg | ORAL_CAPSULE | Freq: Two times a day (BID) | ORAL | 0 refills | Status: DC

## 2018-04-13 NOTE — Progress Notes (Addendum)
  Subjective:     Patient ID: Cheryl [name], female   DOB: Dec 16, 1950, 67 y.o.   MRN: [id number] Chief Complaint  Patient presents with  . Leg Swelling  . Cellulitis   HPI Here accompanied by her sister for evaluation of increased lower leg redness (L>R) for the last week.Also has noticed increased swelling over the past 2-3 days. Weight has increased 10# over the last 4 weeks. She has a hx of lower extremity cellulitis.  Review of Systems     Objective:   Physical Exam  Constitutional: She appears well-developed and well-nourished. No distress.  Cardiovascular:  Pulses:      Dorsalis pedis pulses are 1+ on the right side, and 1+ on the left side.       Posterior tibial pulses are 1+ on the right side, and 1+ on the left side.  Musculoskeletal: She exhibits edema (lower legs feel "woody" on palpation).  Skin:  Small skin tear noted proximal left lower extremity.       Assessment:    1. Cellulitis of left lower leg - cefdinir (OMNICEF) 300 MG capsule; Take 1 capsule (300 mg total) by mouth 2 (two) times daily.  Dispense: 14 capsule; Refill: 0  2. Cellulitis of right lower extremity - cefdinir (OMNICEF) 300 MG capsule; Take 1 capsule (300 mg total) by mouth 2 (two) times daily.  Dispense: 14 capsule; Refill: 0  3. Bilateral leg edema: increase diuretic to daily     Plan:   Do f/u with Dr. Sherrie Mustache as scheduled next week. Continue leg elevation as you are doing. ABX ointment and band-aid applied to the skin tear. Encourage similar treatment at home.

## 2018-04-13 NOTE — Patient Instructions (Signed)
Take your fluid pill (furosemide) every day this week and continue with leg elevation. Put antibiotic ointment and band-aid on leg skin tear. Do follow up with Dr. Sherrie Mustache as scheduled next week.

## 2018-04-14 DIAGNOSIS — I739 Peripheral vascular disease, unspecified: Secondary | ICD-10-CM | POA: Diagnosis not present

## 2018-04-14 DIAGNOSIS — F17210 Nicotine dependence, cigarettes, uncomplicated: Secondary | ICD-10-CM | POA: Diagnosis not present

## 2018-04-14 DIAGNOSIS — G609 Hereditary and idiopathic neuropathy, unspecified: Secondary | ICD-10-CM | POA: Diagnosis not present

## 2018-04-14 DIAGNOSIS — I2510 Atherosclerotic heart disease of native coronary artery without angina pectoris: Secondary | ICD-10-CM | POA: Diagnosis not present

## 2018-04-14 DIAGNOSIS — G8929 Other chronic pain: Secondary | ICD-10-CM | POA: Diagnosis not present

## 2018-04-14 DIAGNOSIS — M4180 Other forms of scoliosis, site unspecified: Secondary | ICD-10-CM | POA: Diagnosis not present

## 2018-04-14 DIAGNOSIS — L03116 Cellulitis of left lower limb: Secondary | ICD-10-CM | POA: Diagnosis not present

## 2018-04-14 DIAGNOSIS — I10 Essential (primary) hypertension: Secondary | ICD-10-CM | POA: Diagnosis not present

## 2018-04-14 DIAGNOSIS — J441 Chronic obstructive pulmonary disease with (acute) exacerbation: Secondary | ICD-10-CM | POA: Diagnosis not present

## 2018-04-15 DIAGNOSIS — J449 Chronic obstructive pulmonary disease, unspecified: Secondary | ICD-10-CM | POA: Diagnosis not present

## 2018-04-17 DIAGNOSIS — G8929 Other chronic pain: Secondary | ICD-10-CM | POA: Diagnosis not present

## 2018-04-17 DIAGNOSIS — I739 Peripheral vascular disease, unspecified: Secondary | ICD-10-CM | POA: Diagnosis not present

## 2018-04-17 DIAGNOSIS — M4180 Other forms of scoliosis, site unspecified: Secondary | ICD-10-CM | POA: Diagnosis not present

## 2018-04-17 DIAGNOSIS — I10 Essential (primary) hypertension: Secondary | ICD-10-CM | POA: Diagnosis not present

## 2018-04-17 DIAGNOSIS — L03116 Cellulitis of left lower limb: Secondary | ICD-10-CM | POA: Diagnosis not present

## 2018-04-17 DIAGNOSIS — I2510 Atherosclerotic heart disease of native coronary artery without angina pectoris: Secondary | ICD-10-CM | POA: Diagnosis not present

## 2018-04-17 DIAGNOSIS — J441 Chronic obstructive pulmonary disease with (acute) exacerbation: Secondary | ICD-10-CM | POA: Diagnosis not present

## 2018-04-17 DIAGNOSIS — G609 Hereditary and idiopathic neuropathy, unspecified: Secondary | ICD-10-CM | POA: Diagnosis not present

## 2018-04-17 DIAGNOSIS — F17210 Nicotine dependence, cigarettes, uncomplicated: Secondary | ICD-10-CM | POA: Diagnosis not present

## 2018-04-21 ENCOUNTER — Encounter: Payer: Self-pay | Admitting: Family Medicine

## 2018-04-21 ENCOUNTER — Ambulatory Visit (INDEPENDENT_AMBULATORY_CARE_PROVIDER_SITE_OTHER): Payer: Medicare HMO | Admitting: Family Medicine

## 2018-04-21 VITALS — BP 110/54 | HR 84 | Temp 98.2°F | Resp 16

## 2018-04-21 DIAGNOSIS — E876 Hypokalemia: Secondary | ICD-10-CM

## 2018-04-21 DIAGNOSIS — L0390 Cellulitis, unspecified: Secondary | ICD-10-CM

## 2018-04-21 DIAGNOSIS — R609 Edema, unspecified: Secondary | ICD-10-CM

## 2018-04-21 MED ORDER — DOXYCYCLINE HYCLATE 100 MG PO TABS: 100.0000 mg | ORAL_TABLET | Freq: Two times a day (BID) | ORAL | 0 refills | Status: DC

## 2018-04-21 NOTE — Progress Notes (Signed)
Patient: Cheryl [name] Female    DOB: 06/27/1951   67 y.o.   MRN: [id number] Visit Date: 04/21/2018  Today's Provider: Lelon Huh, MD   Chief Complaint  Patient presents with  . Follow-up  . Cellulitis   Subjective:    HPI  Cellulitis of left and right lower legs with edema From 04/13/2018-seem by Mariel Sleet. Given rx for cefdinir (OMNICEF) 300 MG capsule and advised to increase diuretic to daily to reduce swelling in legs.   Patient completed antibiotic, however she still has redness and swelling in both lower legs. She states swelling has improved, but is having to urinate all the time and does not want to continue taking furosemide avery day. Has had no fevers or chills.   Allergies  Allergen Reactions  . Percocet [Oxycodone-Acetaminophen] Hives and Rash  . Aspirin Nausea And Vomiting and Other (See Comments)    Reaction:  GI upset   . Codeine Nausea And Vomiting, Other (See Comments) and Nausea Only    Reaction:  GI upset   . Propoxyphene Other (See Comments) and Nausea Only    GI upset Reaction:  GI upset   . Sulfa Antibiotics Rash and Other (See Comments)    Reaction:  GI upset      Current Outpatient Medications:  .  acetaminophen (TYLENOL) 325 MG tablet, Take 2 tablets (650 mg total) by mouth every 6 (six) hours as needed for mild pain (or Fever >/= 101)., Disp: 30 tablet, Rfl: 0 .  albuterol (PROVENTIL) (2.5 MG/3ML) 0.083% nebulizer solution, Take 3 mLs (2.5 mg total) by nebulization every 4 (four) hours as needed for wheezing., Disp: 50 vial, Rfl: 12 .  alendronate (FOSAMAX) 70 MG tablet, Take 1 tablet (70 mg total) by mouth every 7 (seven) days. Take with a full glass of water on an empty stomach., Disp: 4 tablet, Rfl: 11 .  ALPRAZolam (XANAX) 0.5 MG tablet, take 1 tablet by mouth three times a day if needed, Disp: 60 tablet, Rfl: 5 .  celecoxib (CELEBREX) 100 MG capsule, Take 1 capsule (100 mg total) by mouth 2 (two) times daily as needed., Disp: 60  capsule, Rfl: 5 .  clopidogrel (PLAVIX) 75 MG tablet, Take 75 mg by mouth daily., Disp: , Rfl:  .  cyclobenzaprine (FLEXERIL) 5 MG tablet, Take 1 tablet (5 mg total) by mouth 3 (three) times daily as needed for muscle spasms., Disp: 30 tablet, Rfl: 5 .  diphenoxylate-atropine (LOMOTIL) 2.5-0.025 MG per tablet, Take 2 tablets by mouth 4 (four) times daily as needed for diarrhea or loose stools. Reported on 03/11/2016, Disp: , Rfl:  .  donepezil (ARICEPT) 10 MG tablet, Take 10 mg by mouth at bedtime., Disp: , Rfl:  .  DULoxetine (CYMBALTA) 30 MG capsule, Take 1 capsule by mouth daily., Disp: , Rfl:  .  estradiol (ESTRACE) 0.5 MG tablet, Take 1 tablet (0.5 mg total) by mouth daily. (Take in place of estropipate), Disp: 30 tablet, Rfl: 5 .  [START ON 04/22/2018] fentaNYL (DURAGESIC - DOSED MCG/HR) 50 MCG/HR, Place 1 patch (50 mcg total) onto the skin every 3 (three) days., Disp: 10 patch, Rfl: 0 .  Fluticasone-Salmeterol (ADVAIR DISKUS) 250-50 MCG/DOSE AEPB, inhale 1 dose by mouth twice a day, Disp: 60 each, Rfl: 5 .  furosemide (LASIX) 40 MG tablet, Take 40 mg by mouth every other day. , Disp: , Rfl:  .  LYRICA 300 MG capsule, Take 300 mg by mouth 2 (two) times daily.,  Disp: , Rfl: 0 .  mupirocin ointment (BACTROBAN) 2 %, Apply 1 application topically 2 (two) times daily., Disp: 15 g, Rfl: 2 .  NARCAN 4 MG/0.1ML LIQD nasal spray kit, Place 0.4 mg into the nose once. , Disp: , Rfl:  .  pantoprazole (PROTONIX) 40 MG tablet, Take 1 tablet (40 mg total) by mouth 2 (two) times daily., Disp: 60 tablet, Rfl: 12 .  potassium chloride (K-DUR) 10 MEQ tablet, Take 1 tablet by mouth daily., Disp: , Rfl:  .  silver sulfADIAZINE (SILVADENE) 1 % cream, Apply topically daily., Disp: 50 g, Rfl: 3 .  simvastatin (ZOCOR) 10 MG tablet, Take 10 mg by mouth at bedtime. Reported on 12/04/2015, Disp: , Rfl:  .  tiotropium (SPIRIVA HANDIHALER) 18 MCG inhalation capsule, inhale the contents of one capsule in the handihaler once  daily, Disp: 30 capsule, Rfl: 12 .  triamcinolone ointment (KENALOG) 0.5 %, Apply to lesions on hands twice a day as needed, Disp: 30 g, Rfl: 1 .  VENTOLIN HFA 108 (90 Base) MCG/ACT inhaler, Inhale 2 puffs into the lungs every 4 (four) hours as needed for wheezing or shortness of breath., Disp: 18 g, Rfl: 5 .  cefdinir (OMNICEF) 300 MG capsule, Take 1 capsule (300 mg total) by mouth 2 (two) times daily. (Patient not taking: Reported on 04/21/2018), Disp: 14 capsule, Rfl: 0  Review of Systems  Constitutional: Negative for appetite change, chills, fatigue and fever.  Respiratory: Negative for chest tightness and shortness of breath.   Cardiovascular: Negative for chest pain and palpitations.  Gastrointestinal: Negative for abdominal pain, nausea and vomiting.  Neurological: Negative for dizziness and weakness.    Social History   Tobacco Use  . Smoking status: Current Every Day Smoker    Packs/day: 0.50    Years: 50.00    Pack years: 25.00    Types: Cigarettes  . Smokeless tobacco: Never Used  . Tobacco comment: Previously smoked 2 ppd  Substance Use Topics  . Alcohol use: No    Alcohol/week: 0.0 oz   Objective:   BP (!) 110/54 (BP Location: Right Arm, Patient Position: Sitting, Cuff Size: Normal)   Pulse 84   Temp 98.2 F (36.8 C) (Oral)   Resp 16   SpO2 93%  Vitals:   04/21/18 1012  BP: (!) 110/54  Pulse: 84  Resp: 16  Temp: 98.2 F (36.8 C)  TempSrc: Oral  SpO2: 93%     Physical Exam  General appearance: alert, well developed, well nourished, cooperative and in no distress Head: Normocephalic, without obvious abnormality, atraumatic Respiratory: Respirations even and unlabored, normal respiratory rate Extremities: See media for photo of left anterior LE.       Assessment & Plan:     1. Hypokalemia Hold furosemide until labs results.  - Renal function panel  2. Edema, unspecified type Somewhat improved, but not resolved.  - Renal function panel  3.  Cellulitis, unspecified cellulitis site Minimal improvement after finishing cefdinir. Start 10 day doxycycline.   Recheck in 10 days.        Lelon Huh, MD  [facility]

## 2018-04-22 DIAGNOSIS — G609 Hereditary and idiopathic neuropathy, unspecified: Secondary | ICD-10-CM | POA: Diagnosis not present

## 2018-04-22 DIAGNOSIS — I10 Essential (primary) hypertension: Secondary | ICD-10-CM | POA: Diagnosis not present

## 2018-04-22 DIAGNOSIS — J441 Chronic obstructive pulmonary disease with (acute) exacerbation: Secondary | ICD-10-CM | POA: Diagnosis not present

## 2018-04-22 DIAGNOSIS — I739 Peripheral vascular disease, unspecified: Secondary | ICD-10-CM | POA: Diagnosis not present

## 2018-04-22 DIAGNOSIS — F17210 Nicotine dependence, cigarettes, uncomplicated: Secondary | ICD-10-CM | POA: Diagnosis not present

## 2018-04-22 DIAGNOSIS — M4180 Other forms of scoliosis, site unspecified: Secondary | ICD-10-CM | POA: Diagnosis not present

## 2018-04-22 DIAGNOSIS — G8929 Other chronic pain: Secondary | ICD-10-CM | POA: Diagnosis not present

## 2018-04-22 DIAGNOSIS — L03116 Cellulitis of left lower limb: Secondary | ICD-10-CM | POA: Diagnosis not present

## 2018-04-22 DIAGNOSIS — I2510 Atherosclerotic heart disease of native coronary artery without angina pectoris: Secondary | ICD-10-CM | POA: Diagnosis not present

## 2018-04-22 LAB — RENAL FUNCTION PANEL
Albumin: 3.7 g/dL (ref 3.6–4.8)
BUN/Creatinine Ratio: 14 (ref 12–28)
BUN: 16 mg/dL (ref 8–27)
CALCIUM: 9.2 mg/dL (ref 8.7–10.3)
CHLORIDE: 104 mmol/L (ref 96–106)
CO2: 25 mmol/L (ref 20–29)
Creatinine, Ser: 1.11 mg/dL — ABNORMAL HIGH (ref 0.57–1.00)
GFR calc Af Amer: 59 mL/min/{1.73_m2} — ABNORMAL LOW (ref >59)
GFR calc non Af Amer: 51 mL/min/{1.73_m2} — ABNORMAL LOW (ref >59)
Glucose: 92 mg/dL (ref 65–99)
PHOSPHORUS: 4.5 mg/dL (ref 2.5–4.5)
Potassium: 4.3 mmol/L (ref 3.5–5.2)
Sodium: 144 mmol/L (ref 134–144)

## 2018-04-24 DIAGNOSIS — G8929 Other chronic pain: Secondary | ICD-10-CM | POA: Diagnosis not present

## 2018-04-24 DIAGNOSIS — G609 Hereditary and idiopathic neuropathy, unspecified: Secondary | ICD-10-CM | POA: Diagnosis not present

## 2018-04-24 DIAGNOSIS — M4180 Other forms of scoliosis, site unspecified: Secondary | ICD-10-CM | POA: Diagnosis not present

## 2018-04-24 DIAGNOSIS — F17210 Nicotine dependence, cigarettes, uncomplicated: Secondary | ICD-10-CM | POA: Diagnosis not present

## 2018-04-24 DIAGNOSIS — I10 Essential (primary) hypertension: Secondary | ICD-10-CM | POA: Diagnosis not present

## 2018-04-24 DIAGNOSIS — L03116 Cellulitis of left lower limb: Secondary | ICD-10-CM | POA: Diagnosis not present

## 2018-04-24 DIAGNOSIS — I2510 Atherosclerotic heart disease of native coronary artery without angina pectoris: Secondary | ICD-10-CM | POA: Diagnosis not present

## 2018-04-24 DIAGNOSIS — I739 Peripheral vascular disease, unspecified: Secondary | ICD-10-CM | POA: Diagnosis not present

## 2018-04-24 DIAGNOSIS — J441 Chronic obstructive pulmonary disease with (acute) exacerbation: Secondary | ICD-10-CM | POA: Diagnosis not present

## 2018-04-27 ENCOUNTER — Ambulatory Visit: Payer: Medicare HMO | Attending: Nurse Practitioner | Admitting: Nurse Practitioner

## 2018-04-27 ENCOUNTER — Other Ambulatory Visit: Payer: Self-pay

## 2018-04-27 ENCOUNTER — Encounter: Payer: Self-pay | Admitting: Nurse Practitioner

## 2018-04-27 VITALS — BP 147/73 | HR 83 | Temp 97.7°F | Resp 18 | Ht <= 58 in | Wt 96.0 lb

## 2018-04-27 DIAGNOSIS — K219 Gastro-esophageal reflux disease without esophagitis: Secondary | ICD-10-CM | POA: Insufficient documentation

## 2018-04-27 DIAGNOSIS — I2510 Atherosclerotic heart disease of native coronary artery without angina pectoris: Secondary | ICD-10-CM | POA: Insufficient documentation

## 2018-04-27 DIAGNOSIS — E559 Vitamin D deficiency, unspecified: Secondary | ICD-10-CM | POA: Insufficient documentation

## 2018-04-27 DIAGNOSIS — M4726 Other spondylosis with radiculopathy, lumbar region: Secondary | ICD-10-CM | POA: Diagnosis present

## 2018-04-27 DIAGNOSIS — M47816 Spondylosis without myelopathy or radiculopathy, lumbar region: Secondary | ICD-10-CM

## 2018-04-27 DIAGNOSIS — Z7902 Long term (current) use of antithrombotics/antiplatelets: Secondary | ICD-10-CM | POA: Insufficient documentation

## 2018-04-27 DIAGNOSIS — G894 Chronic pain syndrome: Secondary | ICD-10-CM | POA: Diagnosis not present

## 2018-04-27 DIAGNOSIS — E43 Unspecified severe protein-calorie malnutrition: Secondary | ICD-10-CM | POA: Diagnosis not present

## 2018-04-27 DIAGNOSIS — M797 Fibromyalgia: Secondary | ICD-10-CM | POA: Diagnosis not present

## 2018-04-27 DIAGNOSIS — J449 Chronic obstructive pulmonary disease, unspecified: Secondary | ICD-10-CM | POA: Insufficient documentation

## 2018-04-27 DIAGNOSIS — D649 Anemia, unspecified: Secondary | ICD-10-CM | POA: Diagnosis not present

## 2018-04-27 DIAGNOSIS — E785 Hyperlipidemia, unspecified: Secondary | ICD-10-CM | POA: Diagnosis not present

## 2018-04-27 DIAGNOSIS — Z79899 Other long term (current) drug therapy: Secondary | ICD-10-CM | POA: Insufficient documentation

## 2018-04-27 DIAGNOSIS — G43909 Migraine, unspecified, not intractable, without status migrainosus: Secondary | ICD-10-CM | POA: Diagnosis not present

## 2018-04-27 DIAGNOSIS — I10 Essential (primary) hypertension: Secondary | ICD-10-CM | POA: Insufficient documentation

## 2018-04-27 DIAGNOSIS — Z79891 Long term (current) use of opiate analgesic: Secondary | ICD-10-CM

## 2018-04-27 DIAGNOSIS — F329 Major depressive disorder, single episode, unspecified: Secondary | ICD-10-CM | POA: Diagnosis not present

## 2018-04-27 DIAGNOSIS — F410 Panic disorder [episodic paroxysmal anxiety] without agoraphobia: Secondary | ICD-10-CM | POA: Diagnosis not present

## 2018-04-27 DIAGNOSIS — M5416 Radiculopathy, lumbar region: Secondary | ICD-10-CM

## 2018-04-27 DIAGNOSIS — I739 Peripheral vascular disease, unspecified: Secondary | ICD-10-CM | POA: Diagnosis not present

## 2018-04-27 DIAGNOSIS — M810 Age-related osteoporosis without current pathological fracture: Secondary | ICD-10-CM | POA: Diagnosis not present

## 2018-04-27 DIAGNOSIS — M546 Pain in thoracic spine: Secondary | ICD-10-CM | POA: Insufficient documentation

## 2018-04-27 DIAGNOSIS — Z7901 Long term (current) use of anticoagulants: Secondary | ICD-10-CM | POA: Diagnosis not present

## 2018-04-27 DIAGNOSIS — F419 Anxiety disorder, unspecified: Secondary | ICD-10-CM | POA: Diagnosis not present

## 2018-04-27 DIAGNOSIS — F17210 Nicotine dependence, cigarettes, uncomplicated: Secondary | ICD-10-CM | POA: Insufficient documentation

## 2018-04-27 DIAGNOSIS — M549 Dorsalgia, unspecified: Secondary | ICD-10-CM | POA: Diagnosis present

## 2018-04-27 DIAGNOSIS — G4700 Insomnia, unspecified: Secondary | ICD-10-CM | POA: Diagnosis not present

## 2018-04-27 DIAGNOSIS — I5031 Acute diastolic (congestive) heart failure: Secondary | ICD-10-CM | POA: Diagnosis not present

## 2018-04-27 MED ORDER — FENTANYL 50 MCG/HR TD PT72: 50.0000 ug | MEDICATED_PATCH | TRANSDERMAL | 0 refills | Status: DC

## 2018-04-27 NOTE — Progress Notes (Signed)
Nursing Pain Medication Assessment:  Safety precautions to be maintained throughout the outpatient stay will include: orient to surroundings, keep bed in low position, maintain call bell within reach at all times, provide assistance with transfer out of bed and ambulation.  Medication Inspection Compliance: Pill count conducted under aseptic conditions, in front of the patient. Neither the pills nor the bottle was removed from the patient's sight at any time. Once count was completed pills were immediately returned to the patient in their original bottle.  Medication #1: Fentanyl patch Pill/Patch Count: 9 of 10 pills remain Pill/Patch Appearance: Markings consistent with prescribed medication Bottle Appearance: Standard pharmacy container. Clearly labeled. Filled Date: 05/ 29 / 2019 Last Medication intake:  Today  Medication #2: Tramadol (Ultram) Pill/Patch Count: 200 of 240 pills remain Pill/Patch Appearance: Markings consistent with prescribed medication Bottle Appearance: Standard pharmacy container. Clearly labeled. Filled Date: 05 /28/ 2019 Last Medication intake:  Today

## 2018-04-27 NOTE — Patient Instructions (Addendum)
____________________________________________________________________________________________  Medication Rules  Applies to: All patients receiving prescriptions (written or electronic).  Pharmacy of record: Pharmacy where electronic prescriptions will be sent. If written prescriptions are taken to a different pharmacy, please inform the nursing staff. The pharmacy listed in the electronic medical record should be the one where you would like electronic prescriptions to be sent.  Prescription refills: Only during scheduled appointments. Applies to both, written and electronic prescriptions.  NOTE: The following applies primarily to controlled substances (Opioid* Pain Medications).   Patient's responsibilities: 1. Pain Pills: Bring all pain pills to every appointment (except for procedure appointments). 2. Pill Bottles: Bring pills in original pharmacy bottle. Always bring newest bottle. Bring bottle, even if empty. 3. Medication refills: You are responsible for knowing and keeping track of what medications you need refilled. The day before your appointment, write a list of all prescriptions that need to be refilled. Bring that list to your appointment and give it to the admitting nurse. Prescriptions will be written only during appointments. If you forget a medication, it will not be "Called in", "Faxed", or "electronically sent". You will need to get another appointment to get these prescribed. 4. Prescription Accuracy: You are responsible for carefully inspecting your prescriptions before leaving our office. Have the discharge nurse carefully go over each prescription with you, before taking them home. Make sure that your name is accurately spelled, that your address is correct. Check the name and dose of your medication to make sure it is accurate. Check the number of pills, and the written instructions to make sure they are clear and accurate. Make sure that you are given enough medication to last  until your next medication refill appointment. 5. Taking Medication: Take medication as prescribed. Never take more pills than instructed. Never take medication more frequently than prescribed. Taking less pills or less frequently is permitted and encouraged, when it comes to controlled substances (written prescriptions).  6. Inform other Doctors: Always inform, all of your healthcare providers, of all the medications you take. 7. Pain Medication from other Providers: You are not allowed to accept any additional pain medication from any other Doctor or Healthcare provider. There are two exceptions to this rule. (see below) In the event that you require additional pain medication, you are responsible for notifying us, as stated below. 8. Medication Agreement: You are responsible for carefully reading and following our Medication Agreement. This must be signed before receiving any prescriptions from our practice. Safely store a copy of your signed Agreement. Violations to the Agreement will result in no further prescriptions. (Additional copies of our Medication Agreement are available upon request.) 9. Laws, Rules, & Regulations: All patients are expected to follow all Federal and State Laws, Statutes, Rules, & Regulations. Ignorance of the Laws does not constitute a valid excuse. The use of any illegal substances is prohibited. 10. Adopted CDC guidelines & recommendations: Target dosing levels will be at or below 60 MME/day. Use of benzodiazepines** is not recommended.  Exceptions: There are only two exceptions to the rule of not receiving pain medications from other Healthcare Providers. 1. Exception #1 (Emergencies): In the event of an emergency (i.e.: accident requiring emergency care), you are allowed to receive additional pain medication. However, you are responsible for: As soon as you are able, call our office [phone], at any time of the day or night, and leave a message stating your name, the  date and nature of the emergency, and the name and dose of the medication   prescribed. In the event that your call is answered by a member of our staff, make sure to document and save the date, time, and the name of the person that took your information.  2. Exception #2 (Planned Surgery): In the event that you are scheduled by another doctor or dentist to have any type of surgery or procedure, you are allowed (for a period no longer than 30 days), to receive additional pain medication, for the acute post-op pain. However, in this case, you are responsible for picking up a copy of our "Post-op Pain Management for Surgeons" handout, and giving it to your surgeon or dentist. This document is available at our office, and does not require an appointment to obtain it. Simply go to our office during business hours (Monday-Thursday from 8:00 AM to 4:00 PM) (Friday 8:00 AM to 12:00 Noon) or if you have a scheduled appointment with us, prior to your surgery, and ask for it by name. In addition, you will need to provide us with your name, name of your surgeon, type of surgery, and date of procedure or surgery.  *Opioid medications include: morphine, codeine, oxycodone, oxymorphone, hydrocodone, hydromorphone, meperidine, tramadol, tapentadol, buprenorphine, fentanyl, methadone. **Benzodiazepine medications include: diazepam (Valium), alprazolam (Xanax), clonazepam (Klonopine), lorazepam (Ativan), clorazepate (Tranxene), chlordiazepoxide (Librium), estazolam (Prosom), oxazepam (Serax), temazepam (Restoril), triazolam (Halcion) (Last updated: 01/22/2018) ____________________________________________________________________________________________ Bonita QuinYou were given 3 prescriptions for Duragesic patch today.

## 2018-04-27 NOTE — Progress Notes (Addendum)
Patient's Name: Cheryl [name]  MRN: [id number]  Referring Provider: Birdie Sons, MD  DOB: 1950-11-30  PCP: Birdie Sons, MD  DOS: 04/27/2018  Note by: Vevelyn Francois NP  Service setting: Ambulatory outpatient  Specialty: Interventional Pain Management  Location: [facility] (AMB) Pain Management Facility    Patient type: Established    Primary Reason(s) for Visit: Encounter for prescription drug management. (Level of risk: moderate)  CC: Back Pain  HPI  Cheryl [name] is a 67 y.o. year old, female patient, who comes today for a medication management evaluation. Cheryl [name] has Essential hypertension; GERD (gastroesophageal reflux disease); Hyperlipemia; Anxiety; Airway hyperreactivity; Back pain, thoracic; Coronary artery disease; Excessive falling; Alteration in bowel elimination: incontinence; Insomnia; Decreased testosterone level; Leg weakness; Menopausal symptom; Migraine; Neuropathy ([place]); Fecal occult blood test positive; OP (osteoporosis); Panic disorder; Compulsive tobacco user syndrome; Urinary incontinence; Vitamin D deficiency; Weight loss; COPD (chronic obstructive pulmonary disease) ([place]); Dementia; Lumbar radicular pain (Bilateral) (L>R) (L4); Chronic low back pain (Location of Primary Source of Pain) (Bilateral) (L>R); Encounter for therapeutic drug level monitoring; Uncomplicated opioid dependence ([place]); Chronic pain syndrome; Peripheral nerve disease; Peripheral vascular disease ([place]); Platelet inhibition due to Plavix; Abnormal mammogram of right breast; Dilated intrahepatic bile duct; Dysphagia; Acute diastolic CHF (congestive heart failure) ([place]); Leukocytosis; Long term current use of opiate analgesic; Fibromyalgia; Osteoarthrosis; Long term current use of anticoagulant therapy (Plavix); Opiate use (160 MME/Day); Anemia; Lumbar facet syndrome (Location of Primary Source of Pain) (Bilateral) (L>R); Lumbar spondylosis; Nausea with vomiting; Protein-calorie malnutrition, severe; Metabolic  encephalopathy; Hypokalemia; Acute postoperative pain; Pneumonia; Marijuana use; Rib fracture; and Generalized weakness on their problem list. Her primarily concern today is the Back Pain  Pain Assessment: Location: Lower Back Duration: Chronic pain Severity: 3 /10 (subjective, self-reported pain score)  Note: Reported level is compatible with observation.                          Effect on ADL: keeps her from doing housework Modifying factors: denies BP: (!) 147/73  HR: 83  Cheryl [name] was last scheduled for an appointment on 01/22/2018 for medication management. During today's appointment we reviewed Cheryl [name]'s chronic pain status, as well as her outpatient medication regimen. Cheryl [name] denies any concern with her pain. Cheryl [name] was informed that her current MME is several times the recommended MME. Cheryl [name] was informed that Cheryl [name] will be weaned off of one of her current medications. Cheryl [name] is on both long acting Fentanyl 53mg patches and Tramadol 100 mg QID. Cheryl [name] immediately agreed that Cheryl [name] would discontinue the Tramadol. Cheryl [name] s   The patient  reports that Cheryl [name] does not use drugs. Her body mass index is 20.06 kg/m.  Further details on both, my assessment(s), as well as the proposed treatment plan, please see below.  Controlled Substance Pharmacotherapy Assessment REMS (Risk Evaluation and Mitigation Strategy)  Analgesic:Duragesic 50 mcg/h every 72 hours + Tramadol 100 mg PO q6hrs (400 mg/day) MME/day:160 mg/day.  TDewayne Shorter RN  04/27/2018  9:41 AM  Signed Nursing Pain Medication Assessment:  Safety precautions to be maintained throughout the outpatient stay will include: orient to surroundings, keep bed in low position, maintain call bell within reach at all times, provide assistance with transfer out of bed and ambulation.  Medication Inspection Compliance: Pill count conducted under aseptic conditions, in front of the patient. Neither the pills nor the bottle was removed from the patient's sight at any  time. Once count was completed pills  were immediately returned to the patient in their original bottle.  Medication #1: Fentanyl patch Pill/Patch Count: 9 of 10 pills remain Pill/Patch Appearance: Markings consistent with prescribed medication Bottle Appearance: Standard pharmacy container. Clearly labeled. Filled Date: 05/ 29 / 2019 Last Medication intake:  Today  Medication #2: Tramadol (Ultram) Pill/Patch Count: 200 of 240 pills remain Pill/Patch Appearance: Markings consistent with prescribed medication Bottle Appearance: Standard pharmacy container. Clearly labeled. Filled Date: 05 /28/ 2019 Last Medication intake:  Today   Pharmacokinetics: Liberation and absorption (onset of action): WNL Distribution (time to peak effect): WNL Metabolism and excretion (duration of action): WNL         Pharmacodynamics: Desired effects: Analgesia: Cheryl [name] reports >50% benefit. Functional ability: Patient reports that medication allows her to accomplish basic ADLs Clinically meaningful improvement in function (CMIF): Sustained CMIF goals met Perceived effectiveness: Described as relatively effective, allowing for increase in activities of daily living (ADL) Undesirable effects: Side-effects or Adverse reactions: None reported Monitoring: [place] PMP: Online review of the past 72-monthperiod conducted. Compliant with practice rules and regulations Last UDS on record: Summary  Date Value Ref Range Status  06/05/2017 FINAL  Final    Comment:    ==================================================================== TOXASSURE SELECT 13 (MW) ==================================================================== Test                             Result       Flag       Units Drug Present and Declared for Prescription Verification   Fentanyl                       33           EXPECTED   ng/mg creat   Norfentanyl                    321          EXPECTED   ng/mg creat    Source of fentanyl is a  scheduled prescription medication,    including IV, patch, and transmucosal formulations. Norfentanyl    is an expected metabolite of fentanyl.   Tramadol                       PRESENT      EXPECTED   O-Desmethyltramadol            PRESENT      EXPECTED   N-Desmethyltramadol            PRESENT      EXPECTED    Source of tramadol is a prescription medication.    O-desmethyltramadol and N-desmethyltramadol are expected    metabolites of tramadol. Drug Absent but Declared for Prescription Verification   Alprazolam                     Not Detected UNEXPECTED ng/mg creat ==================================================================== Test                      Result    Flag   Units      Ref Range   Creatinine              33               mg/dL      >=20 ==================================================================== Declared Medications:  The flagging and interpretation on this report are based on the  following  declared medications.  Unexpected results may arise from  inaccuracies in the declared medications.  **Note: The testing scope of this panel includes these medications:  Alprazolam (Xanax)  Fentanyl  Tramadol  **Note: The testing scope of this panel does not include following  reported medications:  Acetaminophen (Tylenol)  Albuterol  Atropine (Lomotil)  Celecoxib (Celebrex)  Clopidogrel (Plavix)  Diphenoxylate (Lomotil)  Donepezil (Aricept)  Estropipate (Ogen)  Fluticasone (Advair)  Mupirocin  Pantoprazole (Protonix)  Pregabalin (Lyrica)  Salmeterol (Advair)  Simvastatin (Zocor)  Sumatriptan (Imitrex)  Tiotropium (Spiriva)  Triamcinolone (Kenalog) ==================================================================== For clinical consultation, please call [phone]. ====================================================================    UDS interpretation: Compliant          Medication Assessment Form: Reviewed. Patient indicates being compliant with  therapy Treatment compliance: Compliant Risk Assessment Profile: Aberrant behavior: See prior evaluations. None observed or detected today Comorbid factors increasing risk of overdose: See prior notes. No additional risks detected today Risk of substance use disorder (SUD): Low Opioid Risk Tool - 04/27/18 0938      Family History of Substance Abuse   Alcohol  Positive Female    Illegal Drugs  Positive Female    Rx Drugs  Positive Female or Female      Personal History of Substance Abuse   Alcohol  Negative    Illegal Drugs  Negative    Rx Drugs  Negative      Age   Age between 33-45 years   No      History of Preadolescent Sexual Abuse   History of Preadolescent Sexual Abuse  Negative or Female      Psychological Disease   Psychological Disease  Negative    Depression  Negative      Total Score   Opioid Risk Tool Scoring  7    Opioid Risk Interpretation  Moderate Risk      ORT Scoring interpretation table:  Score <3 = Low Risk for SUD  Score between 4-7 = Moderate Risk for SUD  Score >8 = High Risk for Opioid Abuse   Risk Mitigation Strategies:  Patient Counseling: Covered Patient-Prescriber Agreement (PPA): Present and active  Notification to other healthcare providers: Done  Pharmacologic Plan: No change in therapy, at this time.             Laboratory Chemistry  Inflammation Markers (CRP: Acute Phase) (ESR: Chronic Phase) Lab Results  Component Value Date   CRP 0.6 03/01/2016   ESRSEDRATE 52 (H) 03/01/2016   LATICACIDVEN 0.9 02/05/2018                         Rheumatology Markers Lab Results  Component Value Date   LABURIC 5.9 04/12/2016                        Renal Function Markers Lab Results  Component Value Date   BUN 16 04/21/2018   CREATININE 1.11 (H) 04/21/2018   BCR 14 04/21/2018   GFRAA 59 (L) 04/21/2018   GFRNONAA 51 (L) 04/21/2018                              Hepatic Function Markers Lab Results  Component Value Date   AST 27  02/08/2018   ALT 20 02/08/2018   ALBUMIN 3.7 04/21/2018   ALKPHOS 129 (H) 02/08/2018   AMYLASE 30 10/17/2015   LIPASE 28 02/08/2018   AMMONIA <9 (L)  09/12/2017                        Electrolytes Lab Results  Component Value Date   NA 144 04/21/2018   K 4.3 04/21/2018   CL 104 04/21/2018   CALCIUM 9.2 04/21/2018   MG 1.9 04/29/2017   PHOS 4.5 04/21/2018                        Neuropathy Markers Lab Results  Component Value Date   VITAMINB12 242 03/01/2016   HGBA1C 5.5 04/30/2017                        Bone Pathology Markers Lab Results  Component Value Date   VD25OH 25.6 (L) 01/17/2016   VD125OH2TOT 27 07/04/2017   [id number] 24 07/04/2017   [id number] <10 07/04/2017                         Coagulation Parameters Lab Results  Component Value Date   INR 0.92 02/05/2018   LABPROT 12.3 02/05/2018   APTT 38 (H) 10/17/2015   PLT 159 02/09/2018   DDIMER  10/30/2009    0.32        AT THE INHOUSE ESTABLISHED CUTOFF VALUE OF 0.48 ug/mL FEU, THIS ASSAY HAS BEEN DOCUMENTED IN THE LITERATURE TO HAVE A SENSITIVITY AND NEGATIVE PREDICTIVE VALUE OF AT LEAST 98 TO 99%.  THE TEST RESULT SHOULD BE CORRELATED WITH AN ASSESSMENT OF THE CLINICAL PROBABILITY OF DVT / VTE.                        Cardiovascular Markers Lab Results  Component Value Date   BNP 137.0 (H) 05/14/2017   CKTOTAL 65 04/03/2013   CKMB 1.4 04/03/2013   TROPONINI <0.03 02/05/2018   HGB 9.7 (L) 02/09/2018   HCT 29.7 (L) 02/09/2018                         CA Markers No results found for: CEA, CA125, LABCA2                      Note: Lab results reviewed.  Recent Diagnostic Imaging Results  DG Chest 2 View CLINICAL DATA:  Abdominal pain and emesis.  EXAM: CHEST - 2 VIEW  COMPARISON:  February 05, 2018  FINDINGS: Healed right rib fracture. Probable hiatal hernia. The heart, hila, and mediastinum are stable. Mild opacity seen posteriorly on the lateral view suggests possible infiltrate. The  patient does report recent pneumonia.  IMPRESSION: Mild opacity posteriorly on the lateral view suggest a small amount of residual infiltrate in the left base. Recommend follow-up to complete resolution.  Electronically Signed   By: Dorise Bullion III M.D   On: 02/08/2018 16:07  Complexity Note: Imaging results reviewed. Results shared with Cheryl [name], using Layman's terms.                         Meds   Current Outpatient Medications:  .  acetaminophen (TYLENOL) 325 MG tablet, Take 2 tablets (650 mg total) by mouth every 6 (six) hours as needed for mild pain (or Fever >/= 101)., Disp: 30 tablet, Rfl: 0 .  albuterol (PROVENTIL) (2.5 MG/3ML) 0.083% nebulizer solution, Take 3 mLs (2.5 mg total) by nebulization every 4 (four) hours as  needed for wheezing., Disp: 50 vial, Rfl: 12 .  alendronate (FOSAMAX) 70 MG tablet, Take 1 tablet (70 mg total) by mouth every 7 (seven) days. Take with a full glass of water on an empty stomach., Disp: 4 tablet, Rfl: 11 .  ALPRAZolam (XANAX) 0.5 MG tablet, take 1 tablet by mouth three times a day if needed, Disp: 60 tablet, Rfl: 5 .  celecoxib (CELEBREX) 100 MG capsule, Take 1 capsule (100 mg total) by mouth 2 (two) times daily as needed., Disp: 60 capsule, Rfl: 5 .  clopidogrel (PLAVIX) 75 MG tablet, Take 75 mg by mouth daily., Disp: , Rfl:  .  cyclobenzaprine (FLEXERIL) 5 MG tablet, Take 1 tablet (5 mg total) by mouth 3 (three) times daily as needed for muscle spasms., Disp: 30 tablet, Rfl: 5 .  diphenoxylate-atropine (LOMOTIL) 2.5-0.025 MG per tablet, Take 2 tablets by mouth 4 (four) times daily as needed for diarrhea or loose stools. Reported on 03/11/2016, Disp: , Rfl:  .  donepezil (ARICEPT) 10 MG tablet, Take 10 mg by mouth at bedtime., Disp: , Rfl:  .  doxycycline (VIBRA-TABS) 100 MG tablet, Take 1 tablet (100 mg total) by mouth 2 (two) times daily., Disp: 20 tablet, Rfl: 0 .  DULoxetine (CYMBALTA) 30 MG capsule, Take 1 capsule by mouth daily., Disp: ,  Rfl:  .  estradiol (ESTRACE) 0.5 MG tablet, Take 1 tablet (0.5 mg total) by mouth daily. (Take in place of estropipate), Disp: 30 tablet, Rfl: 5 .  [START ON 07/21/2018] fentaNYL (DURAGESIC - DOSED MCG/HR) 50 MCG/HR, Place 1 patch (50 mcg total) onto the skin every 3 (three) days., Disp: 10 patch, Rfl: 0 .  Fluticasone-Salmeterol (ADVAIR DISKUS) 250-50 MCG/DOSE AEPB, inhale 1 dose by mouth twice a day, Disp: 60 each, Rfl: 5 .  furosemide (LASIX) 40 MG tablet, Take 40 mg by mouth every other day. , Disp: , Rfl:  .  LYRICA 300 MG capsule, Take 300 mg by mouth 2 (two) times daily., Disp: , Rfl: 0 .  mupirocin ointment (BACTROBAN) 2 %, Apply 1 application topically 2 (two) times daily., Disp: 15 g, Rfl: 2 .  NARCAN 4 MG/0.1ML LIQD nasal spray kit, Place 0.4 mg into the nose once. , Disp: , Rfl:  .  pantoprazole (PROTONIX) 40 MG tablet, Take 1 tablet (40 mg total) by mouth 2 (two) times daily., Disp: 60 tablet, Rfl: 12 .  potassium chloride (K-DUR) 10 MEQ tablet, Take 1 tablet by mouth daily., Disp: , Rfl:  .  silver sulfADIAZINE (SILVADENE) 1 % cream, Apply topically daily., Disp: 50 g, Rfl: 3 .  simvastatin (ZOCOR) 10 MG tablet, Take 10 mg by mouth at bedtime. Reported on 12/04/2015, Disp: , Rfl:  .  tiotropium (SPIRIVA HANDIHALER) 18 MCG inhalation capsule, inhale the contents of one capsule in the handihaler once daily, Disp: 30 capsule, Rfl: 12 .  triamcinolone ointment (KENALOG) 0.5 %, Apply to lesions on hands twice a day as needed, Disp: 30 g, Rfl: 1 .  VENTOLIN HFA 108 (90 Base) MCG/ACT inhaler, Inhale 2 puffs into the lungs every 4 (four) hours as needed for wheezing or shortness of breath., Disp: 18 g, Rfl: 5 .  [START ON 06/21/2018] fentaNYL (DURAGESIC - DOSED MCG/HR) 50 MCG/HR, Place 1 patch (50 mcg total) onto the skin every 3 (three) days., Disp: 10 patch, Rfl: 0 .  [START ON 05/22/2018] fentaNYL (DURAGESIC - DOSED MCG/HR) 50 MCG/HR, Place 1 patch (50 mcg total) onto the skin every 3 (three)  days.,  Disp: 10 patch, Rfl: 0  ROS  Constitutional: Denies any fever or chills Gastrointestinal: No reported hemesis, hematochezia, vomiting, or acute GI distress Musculoskeletal: Denies any acute onset joint swelling, redness, loss of ROM, or weakness Neurological: No reported episodes of acute onset apraxia, aphasia, dysarthria, agnosia, amnesia, paralysis, loss of coordination, or loss of consciousness  Allergies  Cheryl [name] is allergic to percocet [oxycodone-acetaminophen]; aspirin; codeine; propoxyphene; and sulfa antibiotics.  [place]  Drug: Cheryl [name]  reports that Cheryl [name] does not use drugs. Alcohol:  reports that Cheryl [name] does not drink alcohol. Tobacco:  reports that Cheryl [name] has been smoking cigarettes.  Cheryl [name] has a 25.00 pack-year smoking history. Cheryl [name] has never used smokeless tobacco. Medical:  has a past medical history of Allergy, Anxiety, Arthritis, Aspiration pneumonitis ([place]) (11/24/2015), Asthma, Chronic pain, COPD (chronic obstructive pulmonary disease) ([place]), Coronary artery disease, DVT (deep venous thrombosis) ([place]), GERD (gastroesophageal reflux disease), Headache, Hyperlipidemia, Hypertension, Kyphoscoliosis deformity of spine, Migraines, Neuropathy (2010), Osteoporosis, Oxygen deficiency, Peripheral vascular disease ([place]), Pneumonia, Pneumonia (11/19/2015), Pneumonia (10/2016), Pneumonia, and Vitamin D deficiency. Surgical: Ms. Phariss  has a past surgical history that includes Appendectomy; Spine surgery; Foot surgery (Bilateral); cardiac catherization (10/31/2009); abdomnal aortic stent (05/30/2008); Abdominal hysterectomy (1975); Cholecystectomy (1972); Cervical fusion (C5 - 6/C6-7); Appendectomy; Colonoscopy with propofol (N/A, 07/27/2015); and Esophagogastroduodenoscopy (egd) with propofol (N/A, 07/27/2015). Family: family history includes Arthritis in her mother; Cancer in her brother, brother, and mother; Diabetes in her brother, mother, and sister; Heart disease in her father and  mother.  Constitutional Exam  General appearance: Well nourished, well developed, and well hydrated. In no apparent acute distress Vitals:   04/27/18 0932 04/27/18 0933  BP:  (!) 147/73  Pulse:  83  Resp:  18  Temp:  97.7 F (36.5 C)  SpO2:  95%  Weight: 96 lb (43.5 kg)   Height: _0  (1.473 m)    BMI Assessment: Estimated body mass index is 20.06 kg/m as calculated from the following:   Height as of this encounter: _1  (1.473 m).   Weight as of this encounter: 96 lb (43.5 kg). Psych/Mental status: Alert, oriented x 3 (person, place, & time)       Eyes: PERLA Respiratory: No evidence of acute respiratory distress   Thoracic Spine Area Exam  Skin & Axial Inspection: Significant thoracic kyphosis Alignment: Symmetrical Functional ROM: Unrestricted ROM Stability: No instability detected Muscle Tone/Strength: Functionally intact. No obvious neuro-muscular anomalies detected. Sensory (Neurological): Unimpaired Muscle strength & Tone: No palpable anomalies  Lumbar Spine Area Exam  Skin & Axial Inspection: Thoraco-lumbar Scoliosis Alignment: Symmetrical Functional ROM: Unrestricted ROM       Stability: No instability detected Muscle Tone/Strength: Functionally intact. No obvious neuro-muscular anomalies detected. Sensory (Neurological): Unimpaired Palpation: No palpable anomalies       Provocative Tests: Lumbar Hyperextension/rotation test: deferred today       Lumbar quadrant test (Kemp's test): deferred today       Lumbar Lateral bending test: deferred today       Patrick's Maneuver: deferred today                   FABER test: deferred today       Thigh-thrust test: deferred today       S-I compression test: deferred today       S-I distraction test: deferred today        Gait & Posture Assessment  Ambulation: Patient ambulates using a walker Gait:  increased speed on the walker Posture:  Kyphosis-lordosis   Lower Extremity Exam    Side: Right lower extremity   Side: Left lower extremity  Stability: No instability observed          Stability: No instability observed          Skin & Extremity Inspection: Skin color, temperature, and hair growth are WNL. No peripheral edema or cyanosis. No masses, redness, swelling, asymmetry, or associated skin lesions. No contractures.  Skin & Extremity Inspection: Skin color, temperature, and hair growth are WNL. No peripheral edema or cyanosis. No masses, redness, swelling, asymmetry, or associated skin lesions. No contractures.  Functional ROM: Unrestricted ROM                  Functional ROM: Unrestricted ROM                  Muscle Tone/Strength: Functionally intact. No obvious neuro-muscular anomalies detected.  Muscle Tone/Strength: Functionally intact. No obvious neuro-muscular anomalies detected.  Sensory (Neurological): Unimpaired  Sensory (Neurological): Unimpaired  Palpation: No palpable anomalies  Palpation: No palpable anomalies   Assessment  Primary Diagnosis & Pertinent Problem List: The primary encounter diagnosis was Lumbar spondylosis. Diagnoses of Lumbar radicular pain (Bilateral) (L>R) (L4), Chronic pain syndrome, and Long term prescription opiate use were also pertinent to this visit.  Status Diagnosis  Controlled Controlled Controlled 1. Lumbar spondylosis   2. Lumbar radicular pain (Bilateral) (L>R) (L4)   3. Chronic pain syndrome   4. Long term prescription opiate use     Problems updated and reviewed during this visit: Problem  Vitamin D Deficiency   Baseline vitamin D level=7.3 10-16-2009    Plan of Care  Pharmacotherapy (Medications Ordered): Meds ordered this encounter  Medications  . fentaNYL (DURAGESIC - DOSED MCG/HR) 50 MCG/HR    Sig: Place 1 patch (50 mcg total) onto the skin every 3 (three) days.    Dispense:  10 patch    Refill:  0    Patient may have prescription filled one day early if pharmacy is closed on scheduled refill date. Do not fill until:07/21/2018 To last  until:08/20/2018    Order Specific Question:   Supervising Provider    Answer:   Milinda Pointer [phone]  . fentaNYL (DURAGESIC - DOSED MCG/HR) 50 MCG/HR    Sig: Place 1 patch (50 mcg total) onto the skin every 3 (three) days.    Dispense:  10 patch    Refill:  0    Patient may have prescription filled one day early if pharmacy is closed on scheduled refill date. Do not fill until: 7/28/2019To last until: 07/21/2018    Order Specific Question:   Supervising Provider    Answer:   Milinda Pointer [phone]  . fentaNYL (DURAGESIC - DOSED MCG/HR) 50 MCG/HR    Sig: Place 1 patch (50 mcg total) onto the skin every 3 (three) days.    Dispense:  10 patch    Refill:  0    Patient may have prescription filled one day early if pharmacy is closed on scheduled refill date. Do not fill until:05/22/2018 To last until: 06/21/2018    Order Specific Question:   Supervising Provider    Answer:   Milinda Pointer [phone]   New Prescriptions   No medications on file   Medications administered today: Cheryl [name] had no medications administered during this visit. Lab-work, procedure(s), and/or referral(s): Orders Placed This Encounter  Procedures  . ToxASSURE Select 13 (MW), Urine   Imaging and/or referral(s): None  Interventional  therapies: Planned, scheduled, and/or pending:   Not at this time. slow taper off the Tramadol. Patient and sister verbalized understanding of slow wean. Cheryl [name] will call the office for an evaluation if any problems should occur. Cheryl [name] will decreased the frequency weekly and then will decrease from 2 tablets to one daily until completed.    Provider-requested follow-up: No follow-ups on file.  Future Appointments  Date Time Provider [place]  05/01/2018  9:00 AM Birdie Sons, MD [id number] None  08/03/2018  9:00 AM Vevelyn Francois, NP [facility] None   Primary Care Physician: Birdie Sons, MD Location: [facility] Note by:  Vevelyn Francois NP Date: 04/27/2018; Time: 11:34 AM  Pain Score Disclaimer: We use the NRS-11 scale. This is a self-reported, subjective measurement of pain severity with only modest accuracy. It is used primarily to identify changes within a particular patient. It must be understood that outpatient pain scales are significantly less accurate that those used for research, where they can be applied under ideal controlled circumstances with minimal exposure to variables. In reality, the score is likely to be a combination of pain intensity and pain affect, where pain affect describes the degree of emotional arousal or changes in action readiness caused by the sensory experience of pain. Factors such as social and work situation, setting, emotional state, anxiety levels, expectation, and prior pain experience may influence pain perception and show large inter-individual differences that may also be affected by time variables.  Patient instructions provided during this appointment: Patient Instructions  ____________________________________________________________________________________________  Medication Rules  Applies to: All patients receiving prescriptions (written or electronic).  Pharmacy of record: Pharmacy where electronic prescriptions will be sent. If written prescriptions are taken to a different pharmacy, please inform the nursing staff. The pharmacy listed in the electronic medical record should be the one where you would like electronic prescriptions to be sent.  Prescription refills: Only during scheduled appointments. Applies to both, written and electronic prescriptions.  NOTE: The following applies primarily to controlled substances (Opioid* Pain Medications).   Patient's responsibilities: 1. Pain Pills: Bring all pain pills to every appointment (except for procedure appointments). 2. Pill Bottles: Bring pills in original pharmacy bottle. Always bring newest bottle. Bring bottle,  even if empty. 3. Medication refills: You are responsible for knowing and keeping track of what medications you need refilled. The day before your appointment, write a list of all prescriptions that need to be refilled. Bring that list to your appointment and give it to the admitting nurse. Prescriptions will be written only during appointments. If you forget a medication, it will not be "Called in", "Faxed", or "electronically sent". You will need to get another appointment to get these prescribed. 4. Prescription Accuracy: You are responsible for carefully inspecting your prescriptions before leaving our office. Have the discharge nurse carefully go over each prescription with you, before taking them home. Make sure that your name is accurately spelled, that your address is correct. Check the name and dose of your medication to make sure it is accurate. Check the number of pills, and the written instructions to make sure they are clear and accurate. Make sure that you are given enough medication to last until your next medication refill appointment. 5. Taking Medication: Take medication as prescribed. Never take more pills than instructed. Never take medication more frequently than prescribed. Taking less pills or less frequently is permitted and encouraged, when it comes to controlled substances (written prescriptions).  6. Inform  other Doctors: Always inform, all of your healthcare providers, of all the medications you take. 7. Pain Medication from other Providers: You are not allowed to accept any additional pain medication from any other Doctor or Healthcare provider. There are two exceptions to this rule. (see below) In the event that you require additional pain medication, you are responsible for notifying us, as stated below. 8. Medication Agreement: You are responsible for carefully reading and following our Medication Agreement. This must be signed before receiving any prescriptions from our  practice. Safely store a copy of your signed Agreement. Violations to the Agreement will result in no further prescriptions. (Additional copies of our Medication Agreement are available upon request.) 9. Laws, Rules, & Regulations: All patients are expected to follow all Federal and [employer], [employer], Rules, [employer] [facility]. Ignorance of the Laws does not constitute a valid excuse. The use of any illegal substances is prohibited. 10. Adopted CDC guidelines & recommendations: Target dosing levels will be at or below 60 MME/day. Use of benzodiazepines** is not recommended.  Exceptions: There are only two exceptions to the rule of not receiving pain medications from other Healthcare Providers. 1. Exception #1 (Emergencies): In the event of an emergency (i.e.: accident requiring emergency care), you are allowed to receive additional pain medication. However, you are responsible for: As soon as you are able, call our office [phone], at any time of the day or night, and leave a message stating your name, the date and nature of the emergency, and the name and dose of the medication prescribed. In the event that your call is answered by a member of our staff, make sure to document and save the date, time, and the name of the person that took your information.  2. Exception #2 (Planned Surgery): In the event that you are scheduled by another doctor or dentist to have any type of surgery or procedure, you are allowed (for a period no longer than 30 days), to receive additional pain medication, for the acute post-op pain. However, in this case, you are responsible for picking up a copy of our "Post-op Pain Management for Surgeons" handout, and giving it to your surgeon or dentist. This document is available at our office, and does not require an appointment to obtain it. Simply go to our office during business hours (Monday-Thursday from 8:00 AM to 4:00 PM) (Friday 8:00 AM to 12:00 Noon) or if you have a  scheduled appointment with [place], prior to your surgery, and ask for it by name. In addition, you will need to provide [place] with your name, name of your surgeon, type of surgery, and date of procedure or surgery.  *Opioid medications include: morphine, codeine, oxycodone, oxymorphone, hydrocodone, hydromorphone, meperidine, tramadol, tapentadol, buprenorphine, fentanyl, methadone. **Benzodiazepine medications include: diazepam (Valium), alprazolam (Xanax), clonazepam (Klonopine), lorazepam (Ativan), clorazepate (Tranxene), chlordiazepoxide (Librium), estazolam (Prosom), oxazepam (Serax), temazepam (Restoril), triazolam (Halcion) (Last updated: 01/22/2018) ____________________________________________________________________________________________ Dennis Bast were given 3 prescriptions for Duragesic patch today.

## 2018-04-28 ENCOUNTER — Ambulatory Visit: Payer: Medicare HMO | Admitting: Nurse Practitioner

## 2018-04-28 DIAGNOSIS — H353132 Nonexudative age-related macular degeneration, bilateral, intermediate dry stage: Secondary | ICD-10-CM | POA: Diagnosis not present

## 2018-04-28 DIAGNOSIS — H2513 Age-related nuclear cataract, bilateral: Secondary | ICD-10-CM | POA: Diagnosis not present

## 2018-04-30 NOTE — Progress Notes (Signed)
Patient: Cheryl [name] Female    DOB: 1951-09-19   67 y.o.   MRN: [id number] Visit Date: 05/01/2018  Today's Provider: Lelon Huh, MD   Chief Complaint  Patient presents with  . Follow-up   Subjective:    HPI  Edema, unspecified type Follow up From 04/21/2017-continued furosemide qd for swelling. She states she is having to urinate all the time and does not want to continue taking every day.  Cellulitis, unspecified cellulitis site From 04/21/2018-Started 10 day doxycycline. She states area is still sore, but much less so.   She also states she is not sleeping through the night and wants to increase alprazolam dose.     Allergies  Allergen Reactions  . Percocet [Oxycodone-Acetaminophen] Hives and Rash  . Aspirin Nausea And Vomiting and Other (See Comments)    Reaction:  GI upset   . Codeine Nausea And Vomiting, Other (See Comments) and Nausea Only    Reaction:  GI upset   . Propoxyphene Other (See Comments) and Nausea Only    GI upset Reaction:  GI upset   . Sulfa Antibiotics Rash and Other (See Comments)    Reaction:  GI upset      Current Outpatient Medications:  .  acetaminophen (TYLENOL) 325 MG tablet, Take 2 tablets (650 mg total) by mouth every 6 (six) hours as needed for mild pain (or Fever >/= 101)., Disp: 30 tablet, Rfl: 0 .  albuterol (PROVENTIL) (2.5 MG/3ML) 0.083% nebulizer solution, Take 3 mLs (2.5 mg total) by nebulization every 4 (four) hours as needed for wheezing., Disp: 50 vial, Rfl: 12 .  alendronate (FOSAMAX) 70 MG tablet, Take 1 tablet (70 mg total) by mouth every 7 (seven) days. Take with a full glass of water on an empty stomach., Disp: 4 tablet, Rfl: 11 .  ALPRAZolam (XANAX) 0.5 MG tablet, take 1 tablet by mouth three times a day if needed, Disp: 60 tablet, Rfl: 5 .  celecoxib (CELEBREX) 100 MG capsule, Take 1 capsule (100 mg total) by mouth 2 (two) times daily as needed., Disp: 60 capsule, Rfl: 5 .  clopidogrel (PLAVIX) 75 MG tablet,  Take 75 mg by mouth daily., Disp: , Rfl:  .  cyclobenzaprine (FLEXERIL) 5 MG tablet, Take 1 tablet (5 mg total) by mouth 3 (three) times daily as needed for muscle spasms., Disp: 30 tablet, Rfl: 5 .  diphenoxylate-atropine (LOMOTIL) 2.5-0.025 MG per tablet, Take 2 tablets by mouth 4 (four) times daily as needed for diarrhea or loose stools. Reported on 03/11/2016, Disp: , Rfl:  .  donepezil (ARICEPT) 10 MG tablet, Take 10 mg by mouth at bedtime., Disp: , Rfl:  .  doxycycline (VIBRA-TABS) 100 MG tablet, Take 1 tablet (100 mg total) by mouth 2 (two) times daily., Disp: 20 tablet, Rfl: 0 .  DULoxetine (CYMBALTA) 30 MG capsule, Take 1 capsule by mouth daily., Disp: , Rfl:  .  estradiol (ESTRACE) 0.5 MG tablet, Take 1 tablet (0.5 mg total) by mouth daily. (Take in place of estropipate), Disp: 30 tablet, Rfl: 5 .  [START ON 07/21/2018] fentaNYL (DURAGESIC - DOSED MCG/HR) 50 MCG/HR, Place 1 patch (50 mcg total) onto the skin every 3 (three) days., Disp: 10 patch, Rfl: 0 .  [START ON 06/21/2018] fentaNYL (DURAGESIC - DOSED MCG/HR) 50 MCG/HR, Place 1 patch (50 mcg total) onto the skin every 3 (three) days., Disp: 10 patch, Rfl: 0 .  [START ON 05/22/2018] fentaNYL (DURAGESIC - DOSED MCG/HR) 50 MCG/HR, Place 1  patch (50 mcg total) onto the skin every 3 (three) days., Disp: 10 patch, Rfl: 0 .  Fluticasone-Salmeterol (ADVAIR DISKUS) 250-50 MCG/DOSE AEPB, inhale 1 dose by mouth twice a day, Disp: 60 each, Rfl: 5 .  furosemide (LASIX) 40 MG tablet, Take 40 mg by mouth every other day. , Disp: , Rfl:  .  LYRICA 300 MG capsule, Take 300 mg by mouth 2 (two) times daily., Disp: , Rfl: 0 .  mupirocin ointment (BACTROBAN) 2 %, Apply 1 application topically 2 (two) times daily., Disp: 15 g, Rfl: 2 .  NARCAN 4 MG/0.1ML LIQD nasal spray kit, Place 0.4 mg into the nose once. , Disp: , Rfl:  .  pantoprazole (PROTONIX) 40 MG tablet, Take 1 tablet (40 mg total) by mouth 2 (two) times daily., Disp: 60 tablet, Rfl: 12 .  potassium  chloride (K-DUR) 10 MEQ tablet, Take 1 tablet by mouth daily., Disp: , Rfl:  .  silver sulfADIAZINE (SILVADENE) 1 % cream, Apply topically daily., Disp: 50 g, Rfl: 3 .  simvastatin (ZOCOR) 10 MG tablet, Take 10 mg by mouth at bedtime. Reported on 12/04/2015, Disp: , Rfl:  .  tiotropium (SPIRIVA HANDIHALER) 18 MCG inhalation capsule, inhale the contents of one capsule in the handihaler once daily, Disp: 30 capsule, Rfl: 12 .  triamcinolone ointment (KENALOG) 0.5 %, Apply to lesions on hands twice a day as needed, Disp: 30 g, Rfl: 1 .  VENTOLIN HFA 108 (90 Base) MCG/ACT inhaler, Inhale 2 puffs into the lungs every 4 (four) hours as needed for wheezing or shortness of breath., Disp: 18 g, Rfl: 5  Review of Systems  Constitutional: Negative for appetite change, chills, fatigue and fever.  Respiratory: Negative for chest tightness and shortness of breath.   Cardiovascular: Negative for chest pain and palpitations.  Gastrointestinal: Negative for abdominal pain, nausea and vomiting.  Skin: Positive for wound.  Neurological: Negative for dizziness and weakness.    Social History   Tobacco Use  . Smoking status: Current Every Day Smoker    Packs/day: 0.50    Years: 50.00    Pack years: 25.00    Types: Cigarettes  . Smokeless tobacco: Never Used  . Tobacco comment: Previously smoked 2 ppd  Substance Use Topics  . Alcohol use: No    Alcohol/week: 0.0 oz   Objective:   BP 140/60 (BP Location: Right Arm, Patient Position: Sitting, Cuff Size: Normal)   Pulse 79   Temp 98.3 F (36.8 C) (Oral)   Resp 16   SpO2 99%  Vitals:   05/01/18 0921  BP: 140/60  Pulse: 79  Resp: 16  Temp: 98.3 F (36.8 C)  TempSrc: Oral  SpO2: 99%     Physical Exam  See media photo. Still some faint erythema and slight tenderness left anterior lower leg, but significantly improved form last visit.     Assessment & Plan:     1. Cellulitis, unspecified cellulitis site Much better , but not resolved,  continue 10 more days - doxycycline (VIBRA-TABS) 100 MG tablet; Take 1 tablet (100 mg total) by mouth 2 (two) times daily.  Dispense: 20 tablet; Refill: 0. Call if not completely healed when finished.   2. Insomnia, unspecified type Counseled on potential interaction of alprazolam and opioids, and it would not be safe to increase alprazolam. Will try- traZODone (DESYREL) 50 MG tablet; Take 1-2 tablets (50-100 mg total) by mouth at bedtime.  Dispense: 30 tablet; Refill: 1  3. Edema, unspecified type Much better, advised  she can reduce furosemide to QOD, but to take QD if any increasing edema or dyspnea.   Return in about 3 months (around 08/01/2018).        Lelon Huh, MD  [facility]

## 2018-05-01 ENCOUNTER — Encounter: Payer: Self-pay | Admitting: Family Medicine

## 2018-05-01 ENCOUNTER — Ambulatory Visit (INDEPENDENT_AMBULATORY_CARE_PROVIDER_SITE_OTHER): Payer: Medicare HMO | Admitting: Family Medicine

## 2018-05-01 VITALS — BP 140/60 | HR 79 | Temp 98.3°F | Resp 16

## 2018-05-01 DIAGNOSIS — I10 Essential (primary) hypertension: Secondary | ICD-10-CM | POA: Diagnosis not present

## 2018-05-01 DIAGNOSIS — L0390 Cellulitis, unspecified: Secondary | ICD-10-CM

## 2018-05-01 DIAGNOSIS — G8929 Other chronic pain: Secondary | ICD-10-CM | POA: Diagnosis not present

## 2018-05-01 DIAGNOSIS — J441 Chronic obstructive pulmonary disease with (acute) exacerbation: Secondary | ICD-10-CM | POA: Diagnosis not present

## 2018-05-01 DIAGNOSIS — R609 Edema, unspecified: Secondary | ICD-10-CM | POA: Diagnosis not present

## 2018-05-01 DIAGNOSIS — G609 Hereditary and idiopathic neuropathy, unspecified: Secondary | ICD-10-CM | POA: Diagnosis not present

## 2018-05-01 DIAGNOSIS — M4180 Other forms of scoliosis, site unspecified: Secondary | ICD-10-CM | POA: Diagnosis not present

## 2018-05-01 DIAGNOSIS — G4700 Insomnia, unspecified: Secondary | ICD-10-CM | POA: Diagnosis not present

## 2018-05-01 DIAGNOSIS — F17210 Nicotine dependence, cigarettes, uncomplicated: Secondary | ICD-10-CM | POA: Diagnosis not present

## 2018-05-01 DIAGNOSIS — I2510 Atherosclerotic heart disease of native coronary artery without angina pectoris: Secondary | ICD-10-CM | POA: Diagnosis not present

## 2018-05-01 DIAGNOSIS — I739 Peripheral vascular disease, unspecified: Secondary | ICD-10-CM | POA: Diagnosis not present

## 2018-05-01 DIAGNOSIS — L03116 Cellulitis of left lower limb: Secondary | ICD-10-CM | POA: Diagnosis not present

## 2018-05-01 LAB — TOXASSURE SELECT 13 (MW), URINE

## 2018-05-01 MED ORDER — TRAZODONE HCL 50 MG PO TABS: 50.0000 mg | ORAL_TABLET | Freq: Every day | ORAL | 1 refills | Status: DC

## 2018-05-01 MED ORDER — DOXYCYCLINE HYCLATE 100 MG PO TABS: 100.0000 mg | ORAL_TABLET | Freq: Two times a day (BID) | ORAL | 0 refills | Status: DC

## 2018-05-01 NOTE — Patient Instructions (Addendum)
   Try OTC Cetaphil cream for legs.    You can reduce furosemide to every other day, but you get short of breath of if legs start swelling you will need to go back to one every day

## 2018-05-11 DIAGNOSIS — J441 Chronic obstructive pulmonary disease with (acute) exacerbation: Secondary | ICD-10-CM | POA: Diagnosis not present

## 2018-05-11 DIAGNOSIS — G609 Hereditary and idiopathic neuropathy, unspecified: Secondary | ICD-10-CM | POA: Diagnosis not present

## 2018-05-11 DIAGNOSIS — G8929 Other chronic pain: Secondary | ICD-10-CM | POA: Diagnosis not present

## 2018-05-11 DIAGNOSIS — F17210 Nicotine dependence, cigarettes, uncomplicated: Secondary | ICD-10-CM | POA: Diagnosis not present

## 2018-05-11 DIAGNOSIS — I2510 Atherosclerotic heart disease of native coronary artery without angina pectoris: Secondary | ICD-10-CM | POA: Diagnosis not present

## 2018-05-11 DIAGNOSIS — I739 Peripheral vascular disease, unspecified: Secondary | ICD-10-CM | POA: Diagnosis not present

## 2018-05-11 DIAGNOSIS — M4180 Other forms of scoliosis, site unspecified: Secondary | ICD-10-CM | POA: Diagnosis not present

## 2018-05-11 DIAGNOSIS — L03116 Cellulitis of left lower limb: Secondary | ICD-10-CM | POA: Diagnosis not present

## 2018-05-11 DIAGNOSIS — I10 Essential (primary) hypertension: Secondary | ICD-10-CM | POA: Diagnosis not present

## 2018-05-12 DIAGNOSIS — L03116 Cellulitis of left lower limb: Secondary | ICD-10-CM | POA: Diagnosis not present

## 2018-05-12 DIAGNOSIS — G609 Hereditary and idiopathic neuropathy, unspecified: Secondary | ICD-10-CM | POA: Diagnosis not present

## 2018-05-12 DIAGNOSIS — M4180 Other forms of scoliosis, site unspecified: Secondary | ICD-10-CM | POA: Diagnosis not present

## 2018-05-12 DIAGNOSIS — I2510 Atherosclerotic heart disease of native coronary artery without angina pectoris: Secondary | ICD-10-CM | POA: Diagnosis not present

## 2018-05-12 DIAGNOSIS — F17210 Nicotine dependence, cigarettes, uncomplicated: Secondary | ICD-10-CM | POA: Diagnosis not present

## 2018-05-12 DIAGNOSIS — J441 Chronic obstructive pulmonary disease with (acute) exacerbation: Secondary | ICD-10-CM | POA: Diagnosis not present

## 2018-05-12 DIAGNOSIS — I10 Essential (primary) hypertension: Secondary | ICD-10-CM | POA: Diagnosis not present

## 2018-05-12 DIAGNOSIS — I739 Peripheral vascular disease, unspecified: Secondary | ICD-10-CM | POA: Diagnosis not present

## 2018-05-12 DIAGNOSIS — G8929 Other chronic pain: Secondary | ICD-10-CM | POA: Diagnosis not present

## 2018-05-13 DIAGNOSIS — I2510 Atherosclerotic heart disease of native coronary artery without angina pectoris: Secondary | ICD-10-CM | POA: Diagnosis not present

## 2018-05-13 DIAGNOSIS — R0602 Shortness of breath: Secondary | ICD-10-CM | POA: Diagnosis not present

## 2018-05-13 DIAGNOSIS — Z720 Tobacco use: Secondary | ICD-10-CM | POA: Diagnosis not present

## 2018-05-13 DIAGNOSIS — I340 Nonrheumatic mitral (valve) insufficiency: Secondary | ICD-10-CM | POA: Diagnosis not present

## 2018-05-14 ENCOUNTER — Other Ambulatory Visit: Payer: Self-pay

## 2018-05-14 ENCOUNTER — Inpatient Hospital Stay
Admission: EM | Admit: 2018-05-14 | Discharge: 2018-05-21 | DRG: 177 | Disposition: A | Discharge status: Home / Self Care | Payer: Medicare HMO | Attending: Internal Medicine | Admitting: Internal Medicine

## 2018-05-14 ENCOUNTER — Emergency Department: Payer: Medicare HMO

## 2018-05-14 DIAGNOSIS — Z86718 Personal history of other venous thrombosis and embolism: Secondary | ICD-10-CM | POA: Diagnosis not present

## 2018-05-14 DIAGNOSIS — K219 Gastro-esophageal reflux disease without esophagitis: Secondary | ICD-10-CM | POA: Diagnosis present

## 2018-05-14 DIAGNOSIS — Z79899 Other long term (current) drug therapy: Secondary | ICD-10-CM | POA: Diagnosis not present

## 2018-05-14 DIAGNOSIS — K2970 Gastritis, unspecified, without bleeding: Secondary | ICD-10-CM | POA: Diagnosis not present

## 2018-05-14 DIAGNOSIS — R4182 Altered mental status, unspecified: Secondary | ICD-10-CM | POA: Diagnosis not present

## 2018-05-14 DIAGNOSIS — R52 Pain, unspecified: Secondary | ICD-10-CM

## 2018-05-14 DIAGNOSIS — Z90710 Acquired absence of both cervix and uterus: Secondary | ICD-10-CM

## 2018-05-14 DIAGNOSIS — J690 Pneumonitis due to inhalation of food and vomit: Principal | ICD-10-CM | POA: Diagnosis present

## 2018-05-14 DIAGNOSIS — R410 Disorientation, unspecified: Secondary | ICD-10-CM | POA: Diagnosis not present

## 2018-05-14 DIAGNOSIS — Z9981 Dependence on supplemental oxygen: Secondary | ICD-10-CM | POA: Diagnosis not present

## 2018-05-14 DIAGNOSIS — R059 Cough, unspecified: Secondary | ICD-10-CM

## 2018-05-14 DIAGNOSIS — J9602 Acute respiratory failure with hypercapnia: Secondary | ICD-10-CM | POA: Diagnosis present

## 2018-05-14 DIAGNOSIS — G9340 Encephalopathy, unspecified: Secondary | ICD-10-CM | POA: Diagnosis not present

## 2018-05-14 DIAGNOSIS — I739 Peripheral vascular disease, unspecified: Secondary | ICD-10-CM | POA: Diagnosis not present

## 2018-05-14 DIAGNOSIS — G8929 Other chronic pain: Secondary | ICD-10-CM | POA: Diagnosis present

## 2018-05-14 DIAGNOSIS — L03116 Cellulitis of left lower limb: Secondary | ICD-10-CM | POA: Diagnosis not present

## 2018-05-14 DIAGNOSIS — F17210 Nicotine dependence, cigarettes, uncomplicated: Secondary | ICD-10-CM | POA: Diagnosis not present

## 2018-05-14 DIAGNOSIS — I5031 Acute diastolic (congestive) heart failure: Secondary | ICD-10-CM | POA: Diagnosis not present

## 2018-05-14 DIAGNOSIS — Z885 Allergy status to narcotic agent status: Secondary | ICD-10-CM

## 2018-05-14 DIAGNOSIS — R1013 Epigastric pain: Secondary | ICD-10-CM | POA: Diagnosis not present

## 2018-05-14 DIAGNOSIS — R05 Cough: Secondary | ICD-10-CM | POA: Diagnosis not present

## 2018-05-14 DIAGNOSIS — J449 Chronic obstructive pulmonary disease, unspecified: Secondary | ICD-10-CM | POA: Diagnosis present

## 2018-05-14 DIAGNOSIS — E876 Hypokalemia: Secondary | ICD-10-CM | POA: Diagnosis not present

## 2018-05-14 DIAGNOSIS — I10 Essential (primary) hypertension: Secondary | ICD-10-CM | POA: Diagnosis present

## 2018-05-14 DIAGNOSIS — F17200 Nicotine dependence, unspecified, uncomplicated: Secondary | ICD-10-CM | POA: Diagnosis present

## 2018-05-14 DIAGNOSIS — G609 Hereditary and idiopathic neuropathy, unspecified: Secondary | ICD-10-CM | POA: Diagnosis not present

## 2018-05-14 DIAGNOSIS — M19032 Primary osteoarthritis, left wrist: Secondary | ICD-10-CM | POA: Diagnosis present

## 2018-05-14 DIAGNOSIS — R110 Nausea: Secondary | ICD-10-CM | POA: Diagnosis not present

## 2018-05-14 DIAGNOSIS — R0902 Hypoxemia: Secondary | ICD-10-CM | POA: Diagnosis present

## 2018-05-14 DIAGNOSIS — R112 Nausea with vomiting, unspecified: Secondary | ICD-10-CM | POA: Diagnosis not present

## 2018-05-14 DIAGNOSIS — I110 Hypertensive heart disease with heart failure: Secondary | ICD-10-CM | POA: Diagnosis not present

## 2018-05-14 DIAGNOSIS — E785 Hyperlipidemia, unspecified: Secondary | ICD-10-CM | POA: Diagnosis not present

## 2018-05-14 DIAGNOSIS — Z79891 Long term (current) use of opiate analgesic: Secondary | ICD-10-CM

## 2018-05-14 DIAGNOSIS — Z981 Arthrodesis status: Secondary | ICD-10-CM

## 2018-05-14 DIAGNOSIS — R2681 Unsteadiness on feet: Secondary | ICD-10-CM | POA: Diagnosis not present

## 2018-05-14 DIAGNOSIS — J9601 Acute respiratory failure with hypoxia: Secondary | ICD-10-CM | POA: Diagnosis not present

## 2018-05-14 DIAGNOSIS — G629 Polyneuropathy, unspecified: Secondary | ICD-10-CM | POA: Diagnosis present

## 2018-05-14 DIAGNOSIS — E860 Dehydration: Secondary | ICD-10-CM | POA: Diagnosis present

## 2018-05-14 DIAGNOSIS — J441 Chronic obstructive pulmonary disease with (acute) exacerbation: Secondary | ICD-10-CM | POA: Diagnosis not present

## 2018-05-14 DIAGNOSIS — Z882 Allergy status to sulfonamides status: Secondary | ICD-10-CM

## 2018-05-14 DIAGNOSIS — D509 Iron deficiency anemia, unspecified: Secondary | ICD-10-CM | POA: Diagnosis present

## 2018-05-14 DIAGNOSIS — Z8701 Personal history of pneumonia (recurrent): Secondary | ICD-10-CM

## 2018-05-14 DIAGNOSIS — K226 Gastro-esophageal laceration-hemorrhage syndrome: Secondary | ICD-10-CM | POA: Diagnosis not present

## 2018-05-14 DIAGNOSIS — I2510 Atherosclerotic heart disease of native coronary artery without angina pectoris: Secondary | ICD-10-CM | POA: Diagnosis not present

## 2018-05-14 DIAGNOSIS — E538 Deficiency of other specified B group vitamins: Secondary | ICD-10-CM | POA: Diagnosis present

## 2018-05-14 DIAGNOSIS — M549 Dorsalgia, unspecified: Secondary | ICD-10-CM | POA: Diagnosis present

## 2018-05-14 DIAGNOSIS — F419 Anxiety disorder, unspecified: Secondary | ICD-10-CM | POA: Diagnosis present

## 2018-05-14 DIAGNOSIS — J189 Pneumonia, unspecified organism: Secondary | ICD-10-CM | POA: Diagnosis not present

## 2018-05-14 DIAGNOSIS — K3189 Other diseases of stomach and duodenum: Secondary | ICD-10-CM | POA: Diagnosis not present

## 2018-05-14 DIAGNOSIS — G9349 Other encephalopathy: Secondary | ICD-10-CM | POA: Diagnosis present

## 2018-05-14 DIAGNOSIS — M4180 Other forms of scoliosis, site unspecified: Secondary | ICD-10-CM | POA: Diagnosis not present

## 2018-05-14 DIAGNOSIS — Z886 Allergy status to analgesic agent status: Secondary | ICD-10-CM

## 2018-05-14 DIAGNOSIS — F329 Major depressive disorder, single episode, unspecified: Secondary | ICD-10-CM | POA: Diagnosis present

## 2018-05-14 LAB — COMPREHENSIVE METABOLIC PANEL
ALK PHOS: 71 U/L (ref 38–126)
ALT: 10 U/L — AB (ref 14–54)
AST: 21 U/L (ref 15–41)
Albumin: 3.4 g/dL — ABNORMAL LOW (ref 3.5–5.0)
Anion gap: 9 (ref 5–15)
BILIRUBIN TOTAL: 0.5 mg/dL (ref 0.3–1.2)
BUN: 25 mg/dL — AB (ref 6–20)
CO2: 26 mmol/L (ref 22–32)
CREATININE: 1.12 mg/dL — AB (ref 0.44–1.00)
Calcium: 8.5 mg/dL — ABNORMAL LOW (ref 8.9–10.3)
Chloride: 103 mmol/L (ref 101–111)
GFR calc Af Amer: 57 mL/min — ABNORMAL LOW (ref >60)
GFR, EST NON AFRICAN AMERICAN: 49 mL/min — AB (ref >60)
Glucose, Bld: 95 mg/dL (ref 65–99)
Potassium: 3.6 mmol/L (ref 3.5–5.1)
Sodium: 138 mmol/L (ref 135–145)
TOTAL PROTEIN: 6.7 g/dL (ref 6.5–8.1)

## 2018-05-14 LAB — BLOOD GAS, VENOUS
Acid-Base Excess: 3.9 mmol/L — ABNORMAL HIGH (ref 0.0–2.0)
Bicarbonate: 30.4 mmol/L — ABNORMAL HIGH (ref 20.0–28.0)
O2 Saturation: 71.9 %
PATIENT TEMPERATURE: 37
PO2 VEN: 40 mmHg (ref 32.0–45.0)
pCO2, Ven: 55 mmHg (ref 44.0–60.0)
pH, Ven: 7.35 (ref 7.250–7.430)

## 2018-05-14 LAB — CBC WITH DIFFERENTIAL/PLATELET
BASOS PCT: 1 %
Basophils Absolute: 0.1 10*3/uL (ref 0–0.1)
EOS PCT: 0 %
Eosinophils Absolute: 0 10*3/uL (ref 0–0.7)
HCT: 30.5 % — ABNORMAL LOW (ref 35.0–47.0)
Hemoglobin: 9.9 g/dL — ABNORMAL LOW (ref 12.0–16.0)
Lymphocytes Relative: 4 %
Lymphs Abs: 0.5 10*3/uL — ABNORMAL LOW (ref 1.0–3.6)
MCH: 26.3 pg (ref 26.0–34.0)
MCHC: 32.6 g/dL (ref 32.0–36.0)
MCV: 80.7 fL (ref 80.0–100.0)
MONO ABS: 0.6 10*3/uL (ref 0.2–0.9)
MONOS PCT: 6 %
Neutro Abs: 9.8 10*3/uL — ABNORMAL HIGH (ref 1.4–6.5)
Neutrophils Relative %: 89 %
PLATELETS: 170 10*3/uL (ref 150–440)
RBC: 3.79 MIL/uL — ABNORMAL LOW (ref 3.80–5.20)
RDW: 19.1 % — AB (ref 11.5–14.5)
WBC: 11 10*3/uL (ref 3.6–11.0)

## 2018-05-14 LAB — URINALYSIS, COMPLETE (UACMP) WITH MICROSCOPIC
BILIRUBIN URINE: NEGATIVE
Bacteria, UA: NONE SEEN
GLUCOSE, UA: NEGATIVE mg/dL
HGB URINE DIPSTICK: NEGATIVE
Ketones, ur: NEGATIVE mg/dL
Leukocytes, UA: NEGATIVE
NITRITE: NEGATIVE
Protein, ur: NEGATIVE mg/dL
SPECIFIC GRAVITY, URINE: 1.014 (ref 1.005–1.030)
pH: 7 (ref 5.0–8.0)

## 2018-05-14 LAB — TROPONIN I: Troponin I: 0.03 ng/mL (ref <0.03)

## 2018-05-14 LAB — LACTIC ACID, PLASMA: Lactic Acid, Venous: 1.1 mmol/L (ref 0.5–1.9)

## 2018-05-14 LAB — GLUCOSE, CAPILLARY: GLUCOSE-CAPILLARY: 95 mg/dL (ref 65–99)

## 2018-05-14 MED ORDER — ALENDRONATE SODIUM 70 MG PO TABS: 70.0000 mg | ORAL_TABLET | ORAL | Status: DC

## 2018-05-14 MED ORDER — VANCOMYCIN HCL IN DEXTROSE 1-5 GM/200ML-% IV SOLN
1000.0000 mg | Freq: Once | INTRAVENOUS | Status: AC
  Administered 2018-05-14: 1000 mg via INTRAVENOUS
  Filled 2018-05-14: qty 200

## 2018-05-14 MED ORDER — SENNOSIDES-DOCUSATE SODIUM 8.6-50 MG PO TABS: 1.0000 | ORAL_TABLET | Freq: Every evening | ORAL | Status: DC | PRN

## 2018-05-14 MED ORDER — ENOXAPARIN SODIUM 40 MG/0.4ML ~~LOC~~ SOLN
40.0000 mg | SUBCUTANEOUS | Status: DC
  Administered 2018-05-14: 21:00:00 40 mg via SUBCUTANEOUS
  Filled 2018-05-14: qty 0.4

## 2018-05-14 MED ORDER — IPRATROPIUM-ALBUTEROL 0.5-2.5 (3) MG/3ML IN SOLN
3.0000 mL | Freq: Four times a day (QID) | RESPIRATORY_TRACT | Status: DC
  Administered 2018-05-14 – 2018-05-15 (×4): 3 mL via RESPIRATORY_TRACT
  Filled 2018-05-14 (×5): qty 3

## 2018-05-14 MED ORDER — SIMVASTATIN 20 MG PO TABS
10.0000 mg | ORAL_TABLET | Freq: Every day | ORAL | Status: DC
  Administered 2018-05-14 – 2018-05-20 (×4): 10 mg via ORAL
  Filled 2018-05-14 (×7): qty 1

## 2018-05-14 MED ORDER — ACETAMINOPHEN 650 MG RE SUPP: 650.0000 mg | Freq: Four times a day (QID) | RECTAL | Status: DC | PRN

## 2018-05-14 MED ORDER — ONDANSETRON HCL 4 MG PO TABS: 4.0000 mg | ORAL_TABLET | Freq: Four times a day (QID) | ORAL | Status: DC | PRN

## 2018-05-14 MED ORDER — PIPERACILLIN-TAZOBACTAM 3.375 G IVPB 30 MIN
3.3750 g | Freq: Once | INTRAVENOUS | Status: AC
  Administered 2018-05-14: 3.375 g via INTRAVENOUS
  Filled 2018-05-14: qty 50

## 2018-05-14 MED ORDER — NICOTINE 21 MG/24HR TD PT24
21.0000 mg | MEDICATED_PATCH | Freq: Every day | TRANSDERMAL | Status: DC
Start: 2018-05-14 — End: 2018-05-21
  Administered 2018-05-16 – 2018-05-21 (×6): 21 mg via TRANSDERMAL
  Filled 2018-05-14 (×6): qty 1

## 2018-05-14 MED ORDER — DULOXETINE HCL 30 MG PO CPEP
30.0000 mg | ORAL_CAPSULE | Freq: Every day | ORAL | Status: DC
  Administered 2018-05-14 – 2018-05-21 (×6): 30 mg via ORAL
  Filled 2018-05-14 (×7): qty 1

## 2018-05-14 MED ORDER — TRAMADOL HCL 50 MG PO TABS
50.0000 mg | ORAL_TABLET | Freq: Four times a day (QID) | ORAL | Status: DC | PRN
  Administered 2018-05-15 – 2018-05-20 (×5): 50 mg via ORAL
  Filled 2018-05-14 (×7): qty 1

## 2018-05-14 MED ORDER — PANTOPRAZOLE SODIUM 40 MG PO TBEC
40.0000 mg | DELAYED_RELEASE_TABLET | Freq: Two times a day (BID) | ORAL | Status: DC
  Administered 2018-05-14 – 2018-05-21 (×9): 40 mg via ORAL
  Filled 2018-05-14 (×13): qty 1

## 2018-05-14 MED ORDER — SODIUM CHLORIDE 0.9 % IV SOLN
INTRAVENOUS | Status: DC
  Administered 2018-05-14 – 2018-05-16 (×2): via INTRAVENOUS

## 2018-05-14 MED ORDER — SODIUM CHLORIDE 0.9 % IV SOLN
3.0000 g | Freq: Four times a day (QID) | INTRAVENOUS | Status: DC
  Administered 2018-05-14 – 2018-05-19 (×19): 3 g via INTRAVENOUS
  Filled 2018-05-14 (×22): qty 3

## 2018-05-14 MED ORDER — DONEPEZIL HCL 5 MG PO TABS
10.0000 mg | ORAL_TABLET | Freq: Every day | ORAL | Status: DC
  Administered 2018-05-14 – 2018-05-20 (×4): 10 mg via ORAL
  Filled 2018-05-14 (×8): qty 2

## 2018-05-14 MED ORDER — ONDANSETRON HCL 4 MG/2ML IJ SOLN
4.0000 mg | Freq: Four times a day (QID) | INTRAMUSCULAR | Status: DC | PRN
  Administered 2018-05-15 – 2018-05-20 (×5): 4 mg via INTRAVENOUS
  Filled 2018-05-14 (×5): qty 2

## 2018-05-14 MED ORDER — CLOPIDOGREL BISULFATE 75 MG PO TABS
75.0000 mg | ORAL_TABLET | Freq: Every day | ORAL | Status: DC
  Administered 2018-05-14 – 2018-05-18 (×4): 75 mg via ORAL
  Filled 2018-05-14 (×4): qty 1

## 2018-05-14 MED ORDER — ACETAMINOPHEN 325 MG PO TABS: 650.0000 mg | ORAL_TABLET | Freq: Four times a day (QID) | ORAL | Status: DC | PRN

## 2018-05-14 MED ORDER — SODIUM CHLORIDE 0.9 % IV SOLN
Freq: Once | INTRAVENOUS | Status: AC
  Administered 2018-05-14: 11:00:00 via INTRAVENOUS

## 2018-05-14 MED ORDER — SODIUM CHLORIDE 0.9 % IV SOLN
Freq: Once | INTRAVENOUS | Status: AC
  Administered 2018-05-14: 12:00:00 via INTRAVENOUS

## 2018-05-14 NOTE — ED Notes (Signed)
This RN put pt on bedpan to urinate. Unable to urinate at this time.

## 2018-05-14 NOTE — Progress Notes (Signed)
Advanced care plan. Purpose of the Encounter: CODE STATUS Parties in Attendance:Patient and family Patient's Decision Capacity:Good Subjective/Patient's story: Presented for lethargy and low oxygen saturation Objective/Medical story Has aspiration pneumonia  Goals of care determination:  Advance care directives and goals of care discussed Patient wants cpr but no intubation and ventilator CODE STATUS: Partial code Time spent discussing advanced care planning: 16 minutes

## 2018-05-14 NOTE — Progress Notes (Signed)
CODE SEPSIS - PHARMACY COMMUNICATION  **Broad Spectrum Antibiotics should be administered within 1 hour of Sepsis diagnosis**  Time Code Sepsis Called/Page Received: 1016  Antibiotics Ordered: Zosyn/Vancomycin  Time of 1st antibiotic administration: 1043 Zosyn   Additional action taken by pharmacy: N/A  If necessary, Name of Provider/Nurse Contacted: none   Simpson,Michael L ,PharmD Clinical Pharmacist  05/14/2018  10:57 AM

## 2018-05-14 NOTE — ED Triage Notes (Signed)
Pt is here with family who states that her b/p is low and O2 sats . States she has a hx of aspiration pneumonia . Pt is slumped over in the wheelchair on arrival. Pt does respond to verbal stimuli,

## 2018-05-14 NOTE — Progress Notes (Signed)

## 2018-05-14 NOTE — ED Notes (Signed)
This RN removed and discarded pt's 8250mcg/hr fentanyl patch that had been on left mid back "for a day and a half," per husband. Removal order from EDP. Unable to determine why patient is so sleepy, hence removal of narcotic patch.

## 2018-05-14 NOTE — ED Notes (Signed)
VS validation at 1600 is not accurate. BP cuff was loose.

## 2018-05-14 NOTE — Progress Notes (Signed)
Pharmacy Antibiotic Note  Cheryl [name] is a 67 y.o. female admitted on 05/14/2018 with pneumonia.  Pharmacy has been consulted for unasyn dosing.  Plan: unasyn 3gm iv q6\h  Height: 4\' 10"  (147.3 cm) Weight: 90 lb (40.8 kg) IBW/kg (Calculated) : 40.9  Temp (24hrs), Avg:100.4 F (38 C), Min:100.4 F (38 C), Max:100.4 F (38 C)  Recent Labs  Lab 05/14/18 1033  WBC 11.0  CREATININE 1.12*  LATICACIDVEN 1.1    Estimated Creatinine Clearance: 30.5 mL/min (A) (by C-G formula based on SCr of 1.12 mg/dL (H)).    Allergies  Allergen Reactions  . Percocet [Oxycodone-Acetaminophen] Hives and Rash  . Aspirin Nausea And Vomiting and Other (See Comments)    Reaction:  GI upset   . Codeine Nausea And Vomiting, Other (See Comments) and Nausea Only    Reaction:  GI upset   . Propoxyphene Other (See Comments) and Nausea Only    GI upset Reaction:  GI upset   . Sulfa Antibiotics Rash and Other (See Comments)    Reaction:  GI upset     Antimicrobials this admission: Anti-infectives (From admission, onward)   Start     Dose/Rate Route Frequency Ordered Stop   05/14/18 1400  Ampicillin-Sulbactam (UNASYN) 3 g in sodium chloride 0.9 % 100 mL IVPB     3 g 200 mL/hr over 30 Minutes Intravenous Every 6 hours 05/14/18 1354     05/14/18 1030  vancomycin (VANCOCIN) IVPB 1000 mg/200 mL premix     1,000 mg 200 mL/hr over 60 Minutes Intravenous  Once 05/14/18 1017 05/14/18 1215   05/14/18 1030  piperacillin-tazobactam (ZOSYN) IVPB 3.375 g     3.375 g 100 mL/hr over 30 Minutes Intravenous  Once 05/14/18 1017 05/14/18 1127       Microbiology results: No results found for this or any previous visit (from the past 240 hour(s)).  Thank you for allowing pharmacy to be a part of this patient's care.  Gerre PebblesGarrett Fynlee Rowlands 05/14/2018 1:55 PM

## 2018-05-14 NOTE — ED Notes (Signed)
Pt very lethargic and BP has been trending down. MD notified. Orders placed by MD. Fluids hung by this RN.

## 2018-05-14 NOTE — ED Provider Notes (Signed)
[facility] Emergency Department Provider Note       Time seen: ----------------------------------------- 10:37 AM on 05/14/2018 ----------------------------------------- Level V caveat: History/ROS limited by altered mental status  I have reviewed the triage vital signs and the nursing notes.  HISTORY   Chief Complaint Fever and Hypotension    HPI Cheryl [name] is a 67 y.o. female with a history of algesic, anxiety, aspiration pneumonia, COPD, coronary disease, DVT, hypertension, peripheral vascular disease who presents to the ED for hypoxia.  Patient is brought here by her family stating that she has low oxygen saturations.  She typically wears oxygen only as needed.  Yesterday she was in her normal state of health, it was reported that last night she had some vomiting.  There was concerns about low blood pressure prior to arrival, on arrival her blood pressure is 110/51.  Room air saturations were 80% and she arrives lethargic.  She does have a history of aspiration pneumonia.  She was unable to respond on arrival.  Past Medical History:  Diagnosis Date  . Allergy   . Anxiety   . Arthritis   . Aspiration pneumonitis (HCC) 11/24/2015  . Asthma   . Chronic pain   . COPD (chronic obstructive pulmonary disease) (HCC)   . Coronary artery disease   . DVT (deep venous thrombosis) (HCC)   . GERD (gastroesophageal reflux disease)   . Headache   . Hyperlipidemia   . Hypertension   . Kyphoscoliosis deformity of spine   . Migraines   . Neuropathy 2010  . Osteoporosis   . Oxygen deficiency   . Peripheral vascular disease (HCC)   . Pneumonia   . Pneumonia 11/19/2015  . Pneumonia 10/2016  . Pneumonia    aspiration 2018- 4 times in last year  . Vitamin D deficiency     Patient Active Problem List   Diagnosis Date Noted  . Generalized weakness 02/08/2018  . Rib fracture 09/19/2017  . Marijuana use 06/05/2017  . Pneumonia 04/29/2017  . Acute  postoperative pain 04/14/2017  . Metabolic encephalopathy 12/23/2016  . Hypokalemia 12/23/2016  . Protein-calorie malnutrition, severe 11/06/2016  . Nausea with vomiting   . Lumbar facet syndrome (Location of Primary Source of Pain) (Bilateral) (L>R) 03/20/2016  . Lumbar spondylosis 03/20/2016  . Opiate use (160 MME/Day) 02/28/2016  . Anemia 02/28/2016  . Osteoarthrosis 12/18/2015  . Long term current use of anticoagulant therapy (Plavix) 12/18/2015  . Long term current use of opiate analgesic 12/04/2015  . Fibromyalgia 12/04/2015  . Dysphagia 11/24/2015  . Acute diastolic CHF (congestive heart failure) (HCC) 11/24/2015  . Leukocytosis 11/24/2015  . Abnormal mammogram of right breast 10/11/2015  . Dilated intrahepatic bile duct 10/11/2015  . Lumbar radicular pain (Bilateral) (L>R) (L4) 09/04/2015  . Chronic low back pain (Location of Primary Source of Pain) (Bilateral) (L>R) 09/04/2015  . Encounter for therapeutic drug level monitoring 09/04/2015  . Uncomplicated opioid dependence (HCC) 09/04/2015  . Chronic pain syndrome 09/04/2015  . Platelet inhibition due to Plavix 09/04/2015  . COPD (chronic obstructive pulmonary disease) (HCC) 07/31/2015  . Dementia 07/31/2015  . Airway hyperreactivity 07/03/2015  . Back pain, thoracic 07/03/2015  . Excessive falling 07/03/2015  . Alteration in bowel elimination: incontinence 07/03/2015  . Insomnia 07/03/2015  . Decreased testosterone level 07/03/2015  . Leg weakness 07/03/2015  . Menopausal symptom 07/03/2015  . Migraine 07/03/2015  . Neuropathy (HCC) 07/03/2015  . Fecal occult blood test positive 07/03/2015  . OP (osteoporosis) 07/03/2015  . Panic  disorder 07/03/2015  . Compulsive tobacco user syndrome 07/03/2015  . Urinary incontinence 07/03/2015  . Weight loss 07/03/2015  . Essential hypertension 06/06/2015  . GERD (gastroesophageal reflux disease) 06/06/2015  . Hyperlipemia 06/06/2015  . Peripheral nerve disease 04/13/2014  .  Anxiety 02/10/2014  . Coronary artery disease 02/10/2014  . Peripheral vascular disease (HCC) 02/10/2014  . Vitamin D deficiency 10/16/2009    Past Surgical History:  Procedure Laterality Date  . ABDOMINAL HYSTERECTOMY  1975   Bilaterl Oophorectomy; Dur to IUD infection  . abdomnal aortic stent  05/30/2008   Dr. Nanetta BattyJonathan Berry  . APPENDECTOMY    . APPENDECTOMY    . cardiac catherization  10/31/2009  . CERVICAL FUSION  C5 - 6/C6-7  . CHOLECYSTECTOMY  1972  . COLONOSCOPY WITH PROPOFOL N/A 07/27/2015   Procedure: COLONOSCOPY WITH PROPOFOL;  Surgeon: Wallace CullensPaul Y Oh, MD;  [facility];  Service: Gastroenterology;  Laterality: N/A;  . ESOPHAGOGASTRODUODENOSCOPY (EGD) WITH PROPOFOL N/A 07/27/2015   Procedure: ESOPHAGOGASTRODUODENOSCOPY (EGD) WITH PROPOFOL;  Surgeon: Wallace CullensPaul Y Oh, MD;  [facility];  Service: Gastroenterology;  Laterality: N/A;  . FOOT SURGERY Bilateral    5-6 years per patient  . SPINE SURGERY      Allergies Percocet [oxycodone-acetaminophen]; Aspirin; Codeine; Propoxyphene; and Sulfa antibiotics  Social History Social History   Tobacco Use  . Smoking status: Current Every Day Smoker    Packs/day: 0.50    Years: 50.00    Pack years: 25.00    Types: Cigarettes  . Smokeless tobacco: Never Used  . Tobacco comment: Previously smoked 2 ppd  Substance Use Topics  . Alcohol use: No    Alcohol/week: 0.0 oz  . Drug use: No   Review of Systems Constitutional: Negative for fever. Eyes: Negative for vision changes ENT:  Negative for congestion, sore throat Cardiovascular: Negative for chest pain. Respiratory: Negative for shortness of breath. Gastrointestinal: Negative for abdominal pain, vomiting and diarrhea. Genitourinary: Negative for dysuria. Musculoskeletal: Negative for back pain. Skin: Negative for rash. Neurological: Negative for headaches, focal weakness or numbness.  All systems negative/normal/unremarkable except as stated in the  HPI  ____________________________________________   PHYSICAL EXAM:  VITAL SIGNS: ED Triage Vitals  Enc Vitals Group     BP 05/14/18 1021 (!) 110/51     Pulse Rate 05/14/18 1021 (!) 103     Resp 05/14/18 1021 20     Temp 05/14/18 1021 (!) 100.4 F (38 C)     Temp Source 05/14/18 1021 Oral     SpO2 05/14/18 1021 (!) 80 %     Weight 05/14/18 1022 90 lb (40.8 kg)     Height 05/14/18 1022 4\' 10"  (1.473 m)     Head Circumference --      Peak Flow --      Pain Score 05/14/18 1022 0     Pain Loc --      Pain Edu? --      Excl. in GC? --    Constitutional: Lethargic, poorly responsive.  Moderate distress Eyes: Conjunctivae are normal. Normal extraocular movements. ENT   Head: Normocephalic and atraumatic.   Nose: No congestion/rhinnorhea.   Mouth/Throat: Mucous membranes are moist.   Neck: No stridor. Cardiovascular: Normal rate, regular rhythm. No murmurs, rubs, or gallops. Respiratory: Grossly clear breath sounds, hypoventilation Gastrointestinal: Soft and nontender. Normal bowel sounds Musculoskeletal: Nontender with normal range of motion in extremities.  Mild edema to the ankles bilaterally Neurologic: Slurred speech, poorly responsive Skin:  Skin is warm, dry  and intact. No rash noted. Psychiatric: Lethargic and poorly responsive ____________________________________________  EKG: Interpreted by me.  Sinus tachycardia with a rate of 101 bpm, nonspecific ST segment changes, prolonged QT, normal axis  ____________________________________________  ED COURSE:  As part of my medical decision making, I reviewed the following data within the electronic MEDICAL RECORD NUMBER History obtained from family if available, nursing notes, old chart and ekg, as well as notes from prior ED visits. Patient presented for altered mental status with hypoxia and likely sepsis from aspiration pneumonia, we will assess with labs and imaging as indicated at this time.    Procedures ____________________________________________   LABS (pertinent positives/negatives)  Labs Reviewed  COMPREHENSIVE METABOLIC PANEL - Abnormal; Notable for the following components:      Result Value   BUN 25 (*)    Creatinine, Ser 1.12 (*)    Calcium 8.5 (*)    Albumin 3.4 (*)    ALT 10 (*)    GFR calc non Af Amer 49 (*)    GFR calc Af Amer 57 (*)    All other components within normal limits  TROPONIN I - Abnormal; Notable for the following components:   Troponin I 0.03 (*)    All other components within normal limits  CBC WITH DIFFERENTIAL/PLATELET - Abnormal; Notable for the following components:   RBC 3.79 (*)    Hemoglobin 9.9 (*)    HCT 30.5 (*)    RDW 19.1 (*)    Neutro Abs 9.8 (*)    Lymphs Abs 0.5 (*)    All other components within normal limits  BLOOD GAS, VENOUS - Abnormal; Notable for the following components:   Bicarbonate 30.4 (*)    Acid-Base Excess 3.9 (*)    All other components within normal limits  URINALYSIS, COMPLETE (UACMP) WITH MICROSCOPIC - Abnormal; Notable for the following components:   Color, Urine STRAW (*)    APPearance CLEAR (*)    All other components within normal limits  CULTURE, BLOOD (ROUTINE X 2)  CULTURE, BLOOD (ROUTINE X 2)  URINE CULTURE  LACTIC ACID, PLASMA  GLUCOSE, CAPILLARY  LACTIC ACID, PLASMA   CRITICAL CARE Performed by: Ulice Dash   Total critical care time: 30 minutes  Critical care time was exclusive of separately billable procedures and treating other patients.  Critical care was necessary to treat or prevent imminent or life-threatening deterioration.  Critical care was time spent personally by me on the following activities: development of treatment plan with patient and/or surrogate as well as nursing, discussions with consultants, evaluation of patient's response to treatment, examination of patient, obtaining history from patient or surrogate, ordering and performing treatments and  interventions, ordering and review of laboratory studies, ordering and review of radiographic studies, pulse oximetry and re-evaluation of patient's condition.  RADIOLOGY Images were viewed by me  Chest x-ray IMPRESSION: No acute cardiopulmonary abnormality seen.  Stable hiatal hernia. ____________________________________________  DIFFERENTIAL DIAGNOSIS   Aspiration pneumonia, community acquired pneumonia, sepsis, dehydration, electrolyte abnormality, acute hypercarbic respiratory failure  FINAL ASSESSMENT AND PLAN  Altered mental status, hypoxia, possible aspiration pneumonia   Plan: The patient had presented for low blood pressure with hypoxia and altered mental status with a history of aspiration pneumonia. Patient's labs were reassuring other than a mildly elevated troponin. Patient's imaging did not reveal any acute process although she has significant cough with occasional rhonchi.  Due to her history we have presumptively covered her with aspiration pneumonia antibiotic coverage.  Blood pressure has been  borderline low here despite IV fluids.  I will discuss with the hospitalist for admission.   Ulice Dash, MD   Note: This note was generated in part or whole with voice recognition software. Voice recognition is usually quite accurate but there are transcription errors that can and very often do occur. I apologize for any typographical errors that were not detected and corrected.     Emily Filbert, MD 05/14/18 1225

## 2018-05-14 NOTE — ED Notes (Signed)
Pt wanting to eat food. Pt currently unable to keep eyes open and stay awake. This RN and EDP do not feel comfortable with patient eating until she can stay awake to talk to us.

## 2018-05-14 NOTE — ED Notes (Signed)
Patient has on fentanyl 50mg  patch that she replaces every three days. Last patch placed on by family Tuesday evening.

## 2018-05-14 NOTE — H&P (Addendum)
[facility] [place] NAME: Cheryl [name]    MR#:  [id number]  DATE OF BIRTH:  25-Dec-1950  DATE OF ADMISSION:  05/14/2018  PRIMARY CARE PHYSICIAN: Birdie Sons, MD   REQUESTING/REFERRING PHYSICIAN:   CHIEF COMPLAINT:   Chief Complaint  Patient presents with  . Fever  . Hypotension    HISTORY OF PRESENT ILLNESS: Cheryl [name]  is a 67 y.o. female with a known history of aspiration pneumonitis, COPD, GERD, DVT, coronary artery disease, hyperlipidemia, neuropathy who uses oxygen at home as needed presented to the emergency room for lethargy and confusion.  Patient oxygen saturation was low and she was brought to the emergency room.  Patient was put on oxygen by nasal cannula.  She also had some low-grade fever.  Lactic acid level was normal.  Patient usually ambulates at home with the help of a walker.  She was given broad-spectrum IV antibiotics in the emergency room..Patient had decreased responsiveness when she presented to the emergency room.  But when history and physical was done patient was much awake and alert and responded to all verbal commands.  She is oriented to self, place and person.  Still actively abuses tobacco.  PAST MEDICAL HISTORY:   Past Medical History:  Diagnosis Date  . Allergy   . Anxiety   . Arthritis   . Aspiration pneumonitis ([place]) 11/24/2015  . Asthma   . Chronic pain   . COPD (chronic obstructive pulmonary disease) ([place])   . Coronary artery disease   . DVT (deep venous thrombosis) ([place])   . GERD (gastroesophageal reflux disease)   . Headache   . Hyperlipidemia   . Hypertension   . Kyphoscoliosis deformity of spine   . Migraines   . Neuropathy 2010  . Osteoporosis   . Oxygen deficiency   . Peripheral vascular disease ([place])   . Pneumonia   . Pneumonia 11/19/2015  . Pneumonia 10/2016  . Pneumonia    aspiration 2018- 4 times in last year  . Vitamin D deficiency     PAST SURGICAL HISTORY:  Past  Surgical History:  Procedure Laterality Date  . ABDOMINAL HYSTERECTOMY  1975   Bilaterl Oophorectomy; [place] to IUD infection  . abdomnal aortic stent  05/30/2008   Dr. Quay Burow  . APPENDECTOMY    . APPENDECTOMY    . cardiac catherization  10/31/2009  . CERVICAL FUSION  C5 - 6/C6-7  . CHOLECYSTECTOMY  1972  . COLONOSCOPY WITH PROPOFOL N/A 07/27/2015   Procedure: COLONOSCOPY WITH PROPOFOL;  Surgeon: Hulen Luster, MD;  Location[facility] END[facility]COPY;  Service: Gastroenterology;  Laterality: N/A;  . ESOPHAGOGASTRODUODENOSCOPY (EGD) WITH PROPOFOL N/A 07/27/2015   Procedure: ESOPHAGOGASTRODUODENOSCOPY (EGD) WITH PROPOFOL;  Surgeon: Hulen Luster, MD;  Location: [facility] ENDOSCOPY;  Service: Gastroenterology;  Laterality: N/A;  . FOOT SURGERY Bilateral    5-6 years per patient  . SPINE SURGERY      SOCIAL HISTORY:  Social History   Tobacco Use  . Smoking status: Current Every Day Smoker    Packs/day: 0.50    Years: 50.00    Pack years: 25.00    Types: Cigarettes  . Smokeless tobacco: Never Used  . Tobacco comment: Previously smoked 2 ppd  Substance Use Topics  . Alcohol use: No    Alcohol/week: 0.0 oz    FAMILY HISTORY:  Family History  Problem Relation Age of Onset  . Cancer Mother   . Arthritis Mother   . Heart  disease Mother   . Diabetes Mother        mellitus, type 2  . Heart disease Father   . Diabetes Sister   . Cancer Brother   . Cancer Brother        lung  . Diabetes Brother     DRUG ALLERGIES:  Allergies  Allergen Reactions  . Percocet [Oxycodone-Acetaminophen] Hives and Rash  . Aspirin Nausea And Vomiting and Other (See Comments)    Reaction:  GI upset   . Codeine Nausea And Vomiting, Other (See Comments) and Nausea Only    Reaction:  GI upset   . Propoxyphene Other (See Comments) and Nausea Only    GI upset Reaction:  GI upset   . Sulfa Antibiotics Rash and Other (See Comments)    Reaction:  GI upset     REVIEW OF SYSTEMS:   CONSTITUTIONAL: Had fever, has  fatigue and weakness.  EYES: No blurred or double vision.  EARS, NOSE, AND THROAT: No tinnitus or ear pain.  RESPIRATORY: Has cough, shortness of breath,  No wheezing or hemoptysis.  CARDIOVASCULAR: No chest pain, orthopnea, edema.  GASTROINTESTINAL: No nausea, vomiting, diarrhea or abdominal pain.  GENITOURINARY: No dysuria, hematuria.  ENDOCRINE: No polyuria, nocturia,  HEMATOLOGY: No anemia, easy bruising or bleeding SKIN: No rash or lesion. MUSCULOSKELETAL: No joint pain or arthritis.   NEUROLOGIC: No tingling, numbness, weakness.  PSYCHIATRY: No anxiety or depression.   MEDICATIONS AT HOME:  Prior to Admission medications   Medication Sig Start Date End Date Taking? Authorizing Provider  acetaminophen (TYLENOL) 325 MG tablet Take 2 tablets (650 mg total) by mouth every 6 (six) hours as needed for mild pain (or Fever >/= 101). 12/25/16  Yes Gladstone Lighter, MD  albuterol (PROVENTIL) (2.5 MG/3ML) 0.083% nebulizer solution Take 3 mLs (2.5 mg total) by nebulization every 4 (four) hours as needed for wheezing. 12/24/17  Yes Birdie Sons, MD  alendronate (FOSAMAX) 70 MG tablet Take 1 tablet (70 mg total) by mouth every 7 (seven) days. Take with a full glass of water on an empty stomach. 01/23/18  Yes Birdie Sons, MD  ALPRAZolam Duanne Moron) 0.5 MG tablet take 1 tablet by mouth three times a day if needed 01/23/18  Yes Fisher, Kirstie Peri, MD  celecoxib (CELEBREX) 100 MG capsule Take 1 capsule (100 mg total) by mouth 2 (two) times daily as needed. 01/23/18  Yes Birdie Sons, MD  clopidogrel (PLAVIX) 75 MG tablet Take 75 mg by mouth daily.   Yes [provider]  cyclobenzaprine (FLEXERIL) 5 MG tablet Take 1 tablet (5 mg total) by mouth 3 (three) times daily as needed for muscle spasms. 02/02/18  Yes Birdie Sons, MD  diphenoxylate-atropine (LOMOTIL) 2.5-0.025 MG per tablet Take 2 tablets by mouth 4 (four) times daily as needed for diarrhea or loose stools. Reported on 03/11/2016    Yes [provider]  donepezil (ARICEPT) 10 MG tablet Take 10 mg by mouth at bedtime.   Yes [provider]  DULoxetine (CYMBALTA) 30 MG capsule Take 1 capsule by mouth daily. 01/26/18  Yes [provider]  estradiol (ESTRACE) 0.5 MG tablet Take 1 tablet (0.5 mg total) by mouth daily. (Take in place of estropipate) 02/26/18  Yes Fisher, Kirstie Peri, MD  fentaNYL (DURAGESIC - DOSED MCG/HR) 50 MCG/HR Place 1 patch (50 mcg total) onto the skin every 3 (three) days. 07/21/18 08/20/18 Yes Vevelyn Francois, NP  fentaNYL (DURAGESIC - DOSED MCG/HR) 50 MCG/HR Place 1 patch (  50 mcg total) onto the skin every 3 (three) days. 05/22/18 06/21/18 Yes Vevelyn Francois, NP  Fluticasone-Salmeterol (ADVAIR DISKUS) 250-50 MCG/DOSE AEPB inhale 1 dose by mouth twice a day 01/23/18  Yes Birdie Sons, MD  furosemide (LASIX) 40 MG tablet Take 40 mg by mouth every other day.  02/16/17  Yes [provider]  LYRICA 300 MG capsule Take 300 mg by mouth 2 (two) times daily. 04/12/17  Yes [provider]  NARCAN 4 MG/0.1ML LIQD nasal spray kit Place 0.4 mg into the nose once.  09/11/17  Yes [provider]  pantoprazole (PROTONIX) 40 MG tablet Take 1 tablet (40 mg total) by mouth 2 (two) times daily. 01/23/18  Yes Birdie Sons, MD  potassium chloride (K-DUR) 10 MEQ tablet Take 1 tablet by mouth daily. 01/22/18  Yes [provider]  simvastatin (ZOCOR) 10 MG tablet Take 10 mg by mouth at bedtime.    Yes [provider]  tiotropium (SPIRIVA HANDIHALER) 18 MCG inhalation capsule inhale the contents of one capsule in the handihaler once daily 01/23/18  Yes Fisher, Kirstie Peri, MD  traZODone (DESYREL) 50 MG tablet Take 1-2 tablets (50-100 mg total) by mouth at bedtime. 05/01/18  Yes Birdie Sons, MD  triamcinolone ointment (KENALOG) 0.5 % Apply to lesions on hands twice a day as needed 10/24/16  Yes Fisher, Kirstie Peri, MD  VENTOLIN HFA 108 (90 Base) MCG/ACT inhaler Inhale 2 puffs  into the lungs every 4 (four) hours as needed for wheezing or shortness of breath. 04/08/18  Yes Birdie Sons, MD  doxycycline (VIBRA-TABS) 100 MG tablet Take 1 tablet (100 mg total) by mouth 2 (two) times daily. Patient not taking: Reported on 05/14/2018 05/01/18   Birdie Sons, MD  fentaNYL (DURAGESIC - DOSED MCG/HR) 50 MCG/HR Place 1 patch (50 mcg total) onto the skin every 3 (three) days. 06/21/18 07/21/18  Vevelyn Francois, NP  mupirocin ointment (BACTROBAN) 2 % Apply 1 application topically 2 (two) times daily. Patient not taking: Reported on 05/14/2018 02/24/18   Birdie Sons, MD  silver sulfADIAZINE (SILVADENE) 1 % cream Apply topically daily. Patient not taking: Reported on 05/14/2018 01/26/18   Birdie Sons, MD      PHYSICAL EXAMINATION:   VITAL SIGNS: Blood pressure (!) 111/47, pulse 69, temperature (!) 100.4 F (38 C), temperature source Oral, resp. rate 13, height '4\' 10"'$  (1.473 m), weight 40.8 kg (90 lb), SpO2 100 %.  GENERAL:  67 y.o.-year-old patient lying in the bed with no acute distress.  EYES: Pupils equal, round, reactive to light and accommodation. No scleral icterus. Extraocular muscles intact.  HEENT: Head atraumatic, normocephalic. Oropharynx dry and nasopharynx clear.  NECK:  Supple, no jugular venous distention. No thyroid enlargement, no tenderness.  LUNGS: Normal breath sounds bilaterally, no wheezing, rales,rhonchi or crepitation. No use of accessory muscles of respiration.  CARDIOVASCULAR: S1, S2 normal. No murmurs, rubs, or gallops.  ABDOMEN: Soft, nontender, nondistended. Bowel sounds present. No organomegaly or mass.  EXTREMITIES: No pedal edema, cyanosis, or clubbing.  NEUROLOGIC: Cranial nerves II through XII are intact. Muscle strength 5/5 in all extremities. Sensation intact. Gait not checked.  PSYCHIATRIC: The patient is alert and oriented x 3.  SKIN: No obvious rash, lesion, or ulcer.   LABORATORY PANEL:   CBC Recent Labs  Lab 05/14/18 1033   WBC 11.0  HGB 9.9*  HCT 30.5*  PLT 170  MCV 80.7  MCH 26.3  MCHC 32.6  RDW 19.1*  LYMPHSABS 0.5*  MONOABS 0.6  EOSABS 0.0  BASOSABS 0.1   ------------------------------------------------------------------------------------------------------------------  Chemistries  Recent Labs  Lab 05/14/18 1033  NA 138  K 3.6  CL 103  CO2 26  GLUCOSE 95  BUN 25*  CREATININE 1.12*  CALCIUM 8.5*  AST 21  ALT 10*  ALKPHOS 71  BILITOT 0.5   ------------------------------------------------------------------------------------------------------------------ estimated creatinine clearance is 30.5 mL/min (A) (by C-G formula based on SCr of 1.12 mg/dL (H)). ------------------------------------------------------------------------------------------------------------------ No results for input(s): TSH, T4TOTAL, T3FREE, THYROIDAB in the last 72 hours.  Invalid input(s): FREET3   Coagulation profile No results for input(s): INR, PROTIME in the last 168 hours. ------------------------------------------------------------------------------------------------------------------- No results for input(s): DDIMER in the last 72 hours. -------------------------------------------------------------------------------------------------------------------  Cardiac Enzymes Recent Labs  Lab 05/14/18 1033  TROPONINI 0.03*   ------------------------------------------------------------------------------------------------------------------ Invalid input(s): POCBNP  ---------------------------------------------------------------------------------------------------------------  Urinalysis    Component Value Date/Time   COLORURINE STRAW (A) 05/14/2018 1033   APPEARANCEUR CLEAR (A) 05/14/2018 1033   APPEARANCEUR Clear 08/19/2014 2200   LABSPEC 1.014 05/14/2018 1033   LABSPEC 1.025 08/19/2014 2200   PHURINE 7.0 05/14/2018 1033   GLUCOSEU NEGATIVE 05/14/2018 1033   GLUCOSEU Negative 08/19/2014 2200    HGBUR NEGATIVE 05/14/2018 1033   BILIRUBINUR NEGATIVE 05/14/2018 1033   BILIRUBINUR Negative 08/19/2014 2200   KETONESUR NEGATIVE 05/14/2018 1033   PROTEINUR NEGATIVE 05/14/2018 1033   NITRITE NEGATIVE 05/14/2018 1033   LEUKOCYTESUR NEGATIVE 05/14/2018 1033   LEUKOCYTESUR Negative 08/19/2014 2200     RADIOLOGY: Dg Chest Port 1 View  Result Date: 05/14/2018 CLINICAL DATA:  Hypoxia. EXAM: PORTABLE CHEST 1 VIEW COMPARISON:  Radiographs of February 08, 2018. FINDINGS: The heart size and mediastinal contours are within normal limits. Both lungs are clear. Stable hiatal hernia is noted. No pneumothorax or pleural effusion is noted. Old right posterior rib fracture is noted. IMPRESSION: No acute cardiopulmonary abnormality seen.  Stable hiatal hernia. Electronically Signed   By: Marijo Conception, M.D.   On: 05/14/2018 10:50    EKG: Orders placed or performed during the hospital encounter of 05/14/18  . EKG 12-Lead  . EKG 12-Lead    IMPRESSION AND PLAN:  67 year old female patient with history of COPD, coronary disease, GERD, hyperlipidemia, DVT in the past, hypertension, osteoporosis, peripheral vascular disease presented to the emergency room for lethargy confusion and fever  -Aspiration pneumonia  start patient on IV Unasyn antibiotic Speech therapy to assess for swallow safety  -COPD Nebulization treatment and continue oxygen via nasal cannula  -Dehydration IV fluids  -Altered mental status secondary to pneumonia Improved  -DVT prophylaxis subcu Lovenox daily  -Hypoxia secondary to aspiration pneumonia Oxygen via nasal cannula  -Tobacco cessation counseled to patient for six minutes Nicotine patch offered  All the records are reviewed and case discussed with ED provider. Management plans discussed with the patient, family and they are in agreement.  CODE STATUS:Partial code    Code Status Orders  (From admission, onward)        Start     Ordered   05/14/18 1352   Limited resuscitation (code)  Continuous    Question Answer Comment  In the event of cardiac or respiratory ARREST: Initiate Code Blue, Call Rapid Response Yes   In the event of cardiac or respiratory ARREST: Perform CPR Yes   In the event of cardiac or respiratory ARREST: Perform Intubation/Mechanical Ventilation No   In the event of cardiac or respiratory ARREST: Use NIPPV/BiPAp only if indicated Yes   In the event of cardiac  or respiratory ARREST: Administer ACLS medications if indicated Yes   In the event of cardiac or respiratory ARREST: Perform Defibrillation or Cardioversion if indicated Yes      05/14/18 1351    Code Status History    Date Active Date Inactive Code Status Order ID Comments User Context   02/08/2018 [place] 02/09/2018 1920 Full Code [id number]  Vaughan Basta, MD Inpatient   02/05/2018 2025 02/07/2018 2119 Full Code [id number]  Hillary Bow, MD ED   04/29/2017 1314 05/02/2017 2148 Full Code [id number]  Epifanio Lesches, MD ED   12/23/2016 1745 12/25/2016 1849 Full Code [id number]  Theodoro Grist, MD ED   11/06/2016 0900 11/07/2016 1853 Full Code [id number]  Max Sane, MD Inpatient   11/05/2016 1401 11/06/2016 0900 Partial Code [id number]  Bettey Costa, MD Inpatient   06/03/2016 0253 06/05/2016 1945 Full Code [id number]  Quintella Baton, MD Inpatient   11/19/2015 1218 11/24/2015 1624 Full Code [id number]  Demetrios Loll, MD Inpatient   11/09/2015 2302 11/13/2015 1851 Full Code [id number]  Lytle Butte, MD ED   10/17/2015 2226 10/22/2015 1303 Full Code [id number]  Fritzi Mandes, MD Inpatient   10/09/2015 1738 10/12/2015 1934 Full Code [id number]  Demetrios Loll, MD Inpatient   08/01/2015 0008 08/03/2015 1454 Full Code [id number]  Lance Coon, MD Inpatient       TOTAL TIME TAKING CARE OF THIS PATIENT: 54 minutes.    Saundra Shelling M.D on 05/14/2018 at 1:51 PM  Between 7am to 6pm - Pager - [phone]  After 6pm go to [email or web address] - password [email or web address] [place] [facility]   Office  [phone]  CC: Primary care physician; Birdie Sons, MD

## 2018-05-14 NOTE — ED Notes (Signed)
Correction: Care handoff given at 11:20am to [facility] RN

## 2018-05-15 LAB — CBC
HEMATOCRIT: 24.1 % — AB (ref 35.0–47.0)
Hemoglobin: 7.5 g/dL — ABNORMAL LOW (ref 12.0–16.0)
MCH: 25.7 pg — ABNORMAL LOW (ref 26.0–34.0)
MCHC: 31.2 g/dL — ABNORMAL LOW (ref 32.0–36.0)
MCV: 82.3 fL (ref 80.0–100.0)
PLATELETS: 146 10*3/uL — AB (ref 150–440)
RBC: 2.92 MIL/uL — ABNORMAL LOW (ref 3.80–5.20)
RDW: 19.3 % — AB (ref 11.5–14.5)
WBC: 13.3 10*3/uL — AB (ref 3.6–11.0)

## 2018-05-15 LAB — BASIC METABOLIC PANEL
Anion gap: 7 (ref 5–15)
BUN: 15 mg/dL (ref 6–20)
CHLORIDE: 109 mmol/L (ref 101–111)
CO2: 24 mmol/L (ref 22–32)
CREATININE: 0.68 mg/dL (ref 0.44–1.00)
Calcium: 7.5 mg/dL — ABNORMAL LOW (ref 8.9–10.3)
GFR calc Af Amer: >60 mL/min (ref >60)
GFR calc non Af Amer: >60 mL/min (ref >60)
GLUCOSE: 97 mg/dL (ref 65–99)
POTASSIUM: 3.7 mmol/L (ref 3.5–5.1)
SODIUM: 140 mmol/L (ref 135–145)

## 2018-05-15 LAB — URINE CULTURE: CULTURE: NO GROWTH

## 2018-05-15 MED ORDER — ENOXAPARIN SODIUM 30 MG/0.3ML ~~LOC~~ SOLN
30.0000 mg | SUBCUTANEOUS | Status: DC
  Administered 2018-05-15 – 2018-05-20 (×4): 30 mg via SUBCUTANEOUS
  Filled 2018-05-15 (×6): qty 0.3

## 2018-05-15 MED ORDER — IPRATROPIUM-ALBUTEROL 0.5-2.5 (3) MG/3ML IN SOLN: 3.0000 mL | Freq: Three times a day (TID) | RESPIRATORY_TRACT | Status: DC

## 2018-05-15 MED ORDER — PROMETHAZINE HCL 25 MG/ML IJ SOLN
12.5000 mg | Freq: Four times a day (QID) | INTRAMUSCULAR | Status: DC | PRN
  Administered 2018-05-16 – 2018-05-19 (×6): 12.5 mg via INTRAVENOUS
  Filled 2018-05-15 (×7): qty 1

## 2018-05-15 NOTE — Progress Notes (Signed)
Pharmacy Antibiotic Note  Cheryl MargaritaMary George [name] is a 67 y.o. female admitted on 05/14/2018 with aspiration pneumonia.  Pharmacy has been consulted for unasyn dosing.  Plan: Continue Unasyn 2g IV every 6 hours.   Height: 4\' 10"  (147.3 cm) Weight: 90 lb (40.8 kg) IBW/kg (Calculated) : 40.9  Temp (24hrs), Avg:99.3 F (37.4 C), Min:98.4 F (36.9 C), Max:100.4 F (38 C)  Recent Labs  Lab 05/14/18 1033 05/15/18 0257  WBC 11.0 13.3*  CREATININE 1.12* 0.68  LATICACIDVEN 1.1  --     Estimated Creatinine Clearance: 42.7 mL/min (by C-G formula based on SCr of 0.68 mg/dL).    Allergies  Allergen Reactions  . Percocet [Oxycodone-Acetaminophen] Hives and Rash  . Aspirin Nausea And Vomiting and Other (See Comments)    Reaction:  GI upset   . Codeine Nausea And Vomiting, Other (See Comments) and Nausea Only    Reaction:  GI upset   . Propoxyphene Other (See Comments) and Nausea Only    GI upset Reaction:  GI upset   . Sulfa Antibiotics Rash and Other (See Comments)    Reaction:  GI upset     Antimicrobials this admission: Anti-infectives (From admission, onward)   Start     Dose/Rate Route Frequency Ordered Stop   05/14/18 1400  Ampicillin-Sulbactam (UNASYN) 3 g in sodium chloride 0.9 % 100 mL IVPB     3 g 200 mL/hr over 30 Minutes Intravenous Every 6 hours 05/14/18 1354     05/14/18 1030  vancomycin (VANCOCIN) IVPB 1000 mg/200 mL premix     1,000 mg 200 mL/hr over 60 Minutes Intravenous  Once 05/14/18 1017 05/14/18 1215   05/14/18 1030  piperacillin-tazobactam (ZOSYN) IVPB 3.375 g     3.375 g 100 mL/hr over 30 Minutes Intravenous  Once 05/14/18 1017 05/14/18 1127       Microbiology results: Recent Results (from the past 240 hour(s))  Blood Culture (routine x 2)     Status: None (Preliminary result)   Collection Time: 05/14/18 10:33 AM  Result Value Ref Range Status   Specimen Description BLOOD RIGHT ANTECUBITAL  Final   Special Requests   Final    BOTTLES DRAWN AEROBIC AND  ANAEROBIC Blood Culture adequate volume   Culture   Final    NO GROWTH < 24 HOURS Performed at [facility], [address] [place], [place] [address]    Report Status PENDING  Incomplete  Blood Culture (routine x 2)     Status: None (Preliminary result)   Collection Time: 05/14/18 10:33 AM  Result Value Ref Range Status   Specimen Description BLOOD BLOOD LEFT WRIST  Final   Special Requests   Final    BOTTLES DRAWN AEROBIC AND ANAEROBIC Blood Culture results may not be optimal due to an inadequate volume of blood received in culture bottles   Culture   Final    NO GROWTH < 24 HOURS Performed at [facility], [address] [place], [place] [address]    Report Status PENDING  Incomplete    Thank you for allowing pharmacy to be a part of this patient's care.  Gardner CandleSheema M Ancelmo Hunt, PharmD, BCPS Clinical Pharmacist 05/15/2018 7:52 AM

## 2018-05-15 NOTE — Care Management (Signed)
Patient admitted from home for aspiration pneumonia.  During progression it is reported that patient is very lethargic today.  She has chronic home oxygen and currently followed by [facility] nursing and physical therapy

## 2018-05-15 NOTE — Progress Notes (Signed)
lovenox dose adjustment Patient ordered lovenox 40 mg subq daily Patient's weight 40.8 kg  Will decrease lovenox to 30 mg subq daily  Thomasene Rippleavid Zev Blue, PharmD, BCPS Clinical Pharmacist 05/15/2018

## 2018-05-15 NOTE — Progress Notes (Signed)
[facility] - [place] [facility]   PATIENT NAME: Cheryl DimmerMary [name]    MR#:  [id number]  DATE OF BIRTH:  1951-02-24  SUBJECTIVE:   Pt. Admitted to hospital due to AMS, lethargy, confusion.  Suspected to have underlying aspiration pneumonia but chest x-ray has been negative.  She did have a low-grade fever admission.  Patient is quite lethargic and encephalopathic this morning.  REVIEW OF SYSTEMS:    Review of Systems  Unable to perform ROS: Mental acuity    Nutrition: 2 gm sodium Tolerating Diet: Yes Tolerating PT: Await Eval.   DRUG ALLERGIES:   Allergies  Allergen Reactions  . Percocet [Oxycodone-Acetaminophen] Hives and Rash  . Aspirin Nausea And Vomiting and Other (See Comments)    Reaction:  GI upset   . Codeine Nausea And Vomiting, Other (See Comments) and Nausea Only    Reaction:  GI upset   . Propoxyphene Other (See Comments) and Nausea Only    GI upset Reaction:  GI upset   . Sulfa Antibiotics Rash and Other (See Comments)    Reaction:  GI upset     VITALS:  Blood pressure (!) 151/69, pulse 91, temperature 98.5 F (36.9 C), temperature source Oral, resp. rate 20, height 4\' 10"  (1.473 m), weight 40.8 kg (90 lb), SpO2 95 %.  PHYSICAL EXAMINATION:   Physical Exam  GENERAL:  67 y.o.-year-old patient lying in bed lethargic/Encephalopathic.  EYES: Pupils equal, round, reactive to light. No scleral icterus. Extraocular muscles intact.  HEENT: Head atraumatic, normocephalic. Oropharynx and nasopharynx clear.  NECK:  Supple, no jugular venous distention. No thyroid enlargement, no tenderness.  LUNGS: Normal breath sounds bilaterally, no wheezing, rales, rhonchi. No use of accessory muscles of respiration.  CARDIOVASCULAR: S1, S2 normal. No murmurs, rubs, or gallops.  ABDOMEN: Soft, nontender, nondistended. Bowel sounds present. No organomegaly or mass.  EXTREMITIES: No cyanosis, clubbing or edema b/l.    NEUROLOGIC: Cranial nerves II through XII are  intact. No focal Motor or sensory deficits b/l. Globally weak.   PSYCHIATRIC: The patient is alert and oriented x 1. SKIN: No obvious rash, lesion, or ulcer.    LABORATORY PANEL:   CBC Recent Labs  Lab 05/15/18 0257  WBC 13.3*  HGB 7.5*  HCT 24.1*  PLT 146*   ------------------------------------------------------------------------------------------------------------------  Chemistries  Recent Labs  Lab 05/14/18 1033 05/15/18 0257  NA 138 140  K 3.6 3.7  CL 103 109  CO2 26 24  GLUCOSE 95 97  BUN 25* 15  CREATININE 1.12* 0.68  CALCIUM 8.5* 7.5*  AST 21  --   ALT 10*  --   ALKPHOS 71  --   BILITOT 0.5  --    ------------------------------------------------------------------------------------------------------------------  Cardiac Enzymes Recent Labs  Lab 05/14/18 1033  TROPONINI 0.03*   ------------------------------------------------------------------------------------------------------------------  RADIOLOGY:  Dg Chest Port 1 View  Result Date: 05/14/2018 CLINICAL DATA:  Hypoxia. EXAM: PORTABLE CHEST 1 VIEW COMPARISON:  Radiographs of February 08, 2018. FINDINGS: The heart size and mediastinal contours are within normal limits. Both lungs are clear. Stable hiatal hernia is noted. No pneumothorax or pleural effusion is noted. Old right posterior rib fracture is noted. IMPRESSION: No acute cardiopulmonary abnormality seen.  Stable hiatal hernia. Electronically Signed   By: Lupita RaiderJames  Green Jr, M.D.   On: 05/14/2018 10:50     ASSESSMENT AND PLAN:   67 year old female with past medical history of dementia, previous history of aspiration pneumonia, chronic pain, COPD, previous history of DVT, GERD, hypertension, hyperlipidemia, neuropathy, PVD  who presented to the hospital due to lethargy weakness/encephalopathy.  1.  Altered mental status/encephalopathy-suspected to be secondary to underlying dementia with suspected aspiration pneumonia. - If patient's mental status is  not improved with IV antibiotics would consider getting a CT of the head to rule out intracranial pathology. - Continue to follow mental status for now.  Patient is off sedative meds including fentanyl patch for now.  2.  Aspiration pneumonia- this was clinically suspected but radiologically patient has no evidence of it.  She has a previous history of aspiration pneumonia.  She had a low-grade fever on admission and a mild leukocytosis. -Continue empiric Unasyn for now.  3.  COPD-no acute exacerbation.  Continue duo nebs.  4.  Tobacco abuse-continue nicotine patch.  5.  Hyperlipidemia-continue simvastatin.  6.  Depression-continue Cymbalta.  7.  GERD-continue Protonix.     All the records are reviewed and case discussed with Care Management/Social Worker. Management plans discussed with the patient, family and they are in agreement.  CODE STATUS: Partial code  DVT Prophylaxis: Lovenox  TOTAL TIME TAKING CARE OF THIS PATIENT: 30 minutes.   POSSIBLE D/C IN 1-2 DAYS, DEPENDING ON CLINICAL CONDITION.   Houston Siren M.D on 05/15/2018 at 3:11 PM  Between 7am to 6pm - Pager - [phone]  After 6pm go to [email or web address] - Social research officer, government  [facility]  Office  [phone]  CC: Primary care physician; Malva Limes, MD

## 2018-05-15 NOTE — Progress Notes (Signed)
Attempted to administer breathing tx, pt very lethargic slow to respond to verbal commands, sats in high 90's pt on 2Lnc. Removed pt from oxygen pt sats maintained in low 90's pt slowly became more responsive. On re-check pt nauseous/vomiting, RN notified nausea meds given, abg drawn PaO2 in 50's pt placed back on 2Lnc. Attempted to administer breathing tx again, pt refused at this time.

## 2018-05-16 ENCOUNTER — Inpatient Hospital Stay: Payer: Medicare HMO

## 2018-05-16 LAB — EXPECTORATED SPUTUM ASSESSMENT W GRAM STAIN, RFLX TO RESP C

## 2018-05-16 LAB — CBC
HCT: 23.7 % — ABNORMAL LOW (ref 35.0–47.0)
Hemoglobin: 7.8 g/dL — ABNORMAL LOW (ref 12.0–16.0)
MCH: 27 pg (ref 26.0–34.0)
MCHC: 33.1 g/dL (ref 32.0–36.0)
MCV: 81.6 fL (ref 80.0–100.0)
PLATELETS: 140 10*3/uL — AB (ref 150–440)
RBC: 2.9 MIL/uL — ABNORMAL LOW (ref 3.80–5.20)
RDW: 19.1 % — AB (ref 11.5–14.5)
WBC: 9.2 10*3/uL (ref 3.6–11.0)

## 2018-05-16 LAB — EXPECTORATED SPUTUM ASSESSMENT W REFEX TO RESP CULTURE

## 2018-05-16 LAB — FERRITIN: Ferritin: 43 ng/mL (ref 11–307)

## 2018-05-16 LAB — HIV ANTIBODY (ROUTINE TESTING W REFLEX): HIV Screen 4th Generation wRfx: NONREACTIVE

## 2018-05-16 MED ORDER — FENTANYL 50 MCG/HR TD PT72
50.0000 ug | MEDICATED_PATCH | TRANSDERMAL | Status: DC
  Administered 2018-05-16 – 2018-05-19 (×2): 50 ug via TRANSDERMAL
  Filled 2018-05-16 (×2): qty 1

## 2018-05-16 MED ORDER — ALPRAZOLAM 0.5 MG PO TABS
0.5000 mg | ORAL_TABLET | Freq: Every evening | ORAL | Status: DC | PRN
  Administered 2018-05-18 – 2018-05-19 (×2): 0.5 mg via ORAL
  Filled 2018-05-16 (×4): qty 1

## 2018-05-16 MED ORDER — IPRATROPIUM-ALBUTEROL 0.5-2.5 (3) MG/3ML IN SOLN
3.0000 mL | Freq: Four times a day (QID) | RESPIRATORY_TRACT | Status: DC | PRN
  Administered 2018-05-16: 20:00:00 3 mL via RESPIRATORY_TRACT
  Filled 2018-05-16: qty 3

## 2018-05-16 NOTE — Progress Notes (Signed)
Patient ID: Cheryl [name], female   DOB: 08/04/1951, 67 y.o.   MRN: [id number]  [employer] [facility] PROGRESS NOTE  Cheryl [name] WUJ:[id number] DOB: 1951-10-05 DOA: 05/14/2018 PCP: Malva Limes, MD  HPI/Subjective: Patient is better with regards to her mental status.  Asking for her fentanyl patch to be resumed.  She has chronic back pain.  Some cough shortness of breath and yellowish sputum.  She vomited last night.  Objective: Vitals:   05/16/18 0807 05/16/18 1423  BP:  (!) 152/68  Pulse:  92  Resp:  18  Temp:  99.1 F (37.3 C)  SpO2: 93% 93%    Intake/Output Summary (Last 24 hours) at 05/16/2018 1426 Last data filed at 05/16/2018 1408 Gross per 24 hour  Intake 480 ml  Output -  Net 480 ml   Filed Weights   05/14/18 1022  Weight: 40.8 kg (90 lb)    ROS: Review of Systems  Constitutional: Negative for chills and fever.  Eyes: Negative for blurred vision.  Respiratory: Positive for cough, sputum production and shortness of breath.   Cardiovascular: Negative for chest pain.  Gastrointestinal: Negative for abdominal pain, constipation, diarrhea, nausea and vomiting.  Genitourinary: Negative for dysuria.  Musculoskeletal: Negative for joint pain.  Neurological: Negative for dizziness and headaches.   Exam: Physical Exam  Constitutional: She is oriented to person, place, and time.  HENT:  Nose: No mucosal edema.  Mouth/Throat: No oropharyngeal exudate or posterior oropharyngeal edema.  Eyes: Pupils are equal, round, and reactive to light. Conjunctivae, EOM and lids are normal.  Neck: No JVD present. Carotid bruit is not present. No edema present. No thyroid mass and no thyromegaly present.  Cardiovascular: S1 normal and S2 normal. Exam reveals no gallop.  No murmur heard. Pulses:      Dorsalis pedis pulses are 2+ on the right side, and 2+ on the left side.  Respiratory: No respiratory distress. She has decreased breath sounds in the right lower field and the  left lower field. She has no wheezes. She has rhonchi in the right lower field and the left lower field. She has no rales.  GI: Soft. Bowel sounds are normal. There is no tenderness.  Musculoskeletal:       Right ankle: She exhibits no swelling.       Left ankle: She exhibits no swelling.  Lymphadenopathy:    She has no cervical adenopathy.  Neurological: She is alert and oriented to person, place, and time. No cranial nerve deficit.  Skin: Skin is warm. No rash noted. Nails show no clubbing.  Psychiatric: She has a normal mood and affect.      Data Reviewed: Basic Metabolic Panel: Recent Labs  Lab 05/14/18 1033 05/15/18 0257  NA 138 140  K 3.6 3.7  CL 103 109  CO2 26 24  GLUCOSE 95 97  BUN 25* 15  CREATININE 1.12* 0.68  CALCIUM 8.5* 7.5*   Liver Function Tests: Recent Labs  Lab 05/14/18 1033  AST 21  ALT 10*  ALKPHOS 71  BILITOT 0.5  PROT 6.7  ALBUMIN 3.4*   CBC: Recent Labs  Lab 05/14/18 1033 05/15/18 0257 05/16/18 0334  WBC 11.0 13.3* 9.2  NEUTROABS 9.8*  --   --   HGB 9.9* 7.5* 7.8*  HCT 30.5* 24.1* 23.7*  MCV 80.7 82.3 81.6  PLT 170 146* 140*   Cardiac Enzymes: Recent Labs  Lab 05/14/18 1033  TROPONINI 0.03*    CBG: Recent Labs  Lab 05/14/18 1016  GLUCAP 95    Recent Results (from the past 240 hour(s))  Blood Culture (routine x 2)     Status: None (Preliminary result)   Collection Time: 05/14/18 10:33 AM  Result Value Ref Range Status   Specimen Description BLOOD RIGHT ANTECUBITAL  Final   Special Requests   Final    BOTTLES DRAWN AEROBIC AND ANAEROBIC Blood Culture adequate volume   Culture   Final    NO GROWTH 2 DAYS Performed at [facility], [address] [place], [place] [address]    Report Status PENDING  Incomplete  Blood Culture (routine x 2)     Status: None (Preliminary result)   Collection Time: 05/14/18 10:33 AM  Result Value Ref Range Status   Specimen Description BLOOD BLOOD LEFT WRIST  Final   Special  Requests   Final    BOTTLES DRAWN AEROBIC AND ANAEROBIC Blood Culture results may not be optimal due to an inadequate volume of blood received in culture bottles   Culture   Final    NO GROWTH 2 DAYS Performed at [facility], [address] [place], [place] [address]    Report Status PENDING  Incomplete  Urine culture     Status: None   Collection Time: 05/14/18 10:33 AM  Result Value Ref Range Status   Specimen Description   Final    URINE, RANDOM Performed at [facility], [address], [place], [place] [address]    Special Requests   Final    NONE Performed at [facility], [address] [place], [place] [address]    Culture   Final    NO GROWTH Performed at [facility], [address], [place], [place] [address]    Report Status 05/15/2018 FINAL  Final     Studies: Dg Chest 2 View  Result Date: 05/16/2018 CLINICAL DATA:  Cough; Pt. admitted 05-14-18 due to AMS, lethargy, confusion. Suspected to have underlying aspiration pneumonia but chest x-ray has been negative. She did have a low-grade fever admission.Hx/o COPD, CAD, DVTSmoker EXAM: CHEST - 2 VIEW COMPARISON:  05/14/2018 and older exams. FINDINGS: Cardiac silhouette is normal in size. No mediastinal or hilar masses. No convincing adenopathy. Small to moderate hiatal hernia. There is opacity at the posterior left lung base, mildly increased when compared to the study dated 02/08/2018. This may reflect pneumonia or atelectasis, the latter favored. There are prominent bronchovascular markings. No other evidence pneumonia. No evidence of pulmonary edema. No pleural effusion or pneumothorax. Skeletal structures are demineralized but grossly intact. IMPRESSION: 1. Left posterior lower lobe opacity mildly increased when compared to a chest radiograph dated 02/08/2018. This may reflect pneumonia or be due to atelectasis. 2. No other evidence of acute cardiopulmonary disease. Electronically Signed    By: Amie Portland M.D.   On: 05/16/2018 13:30    Scheduled Meds: . clopidogrel  75 mg Oral Daily  . donepezil  10 mg Oral QHS  . DULoxetine  30 mg Oral Daily  . enoxaparin (LOVENOX) injection  30 mg Subcutaneous Q24H  . fentaNYL  50 mcg Transdermal Q72H  . nicotine  21 mg Transdermal Daily  . pantoprazole  40 mg Oral BID  . simvastatin  10 mg Oral QHS   Continuous Infusions: . ampicillin-sulbactam (UNASYN) IV Stopped (05/16/18 0820)    Assessment/Plan:  1. Acute encephalopathy secondary to pneumonia.  This has improved.  Watch closely with restarting fentanyl patch.  2. Pneumonia with slight temperature on presentation.  Continue Unasyn.  Declined swallow evaluation. 3. History of COPD.  Continue nebulizers. 4. Hyperlipidemia unspecified on simvastatin 5. Depression on Cymbalta 6. GERD on Protonix 7. Chronic back pain on fentanyl patch  Code Status:     Code Status Orders  (From admission, onward)        Start     Ordered   05/14/18 1352  Limited resuscitation (code)  Continuous    Question Answer Comment  In the event of cardiac or respiratory ARREST: Initiate Code Blue, Call Rapid Response Yes   In the event of cardiac or respiratory ARREST: Perform CPR Yes   In the event of cardiac or respiratory ARREST: Perform Intubation/Mechanical Ventilation No   In the event of cardiac or respiratory ARREST: Use NIPPV/BiPAp only if indicated Yes   In the event of cardiac or respiratory ARREST: Administer ACLS medications if indicated Yes   In the event of cardiac or respiratory ARREST: Perform Defibrillation or Cardioversion if indicated Yes      05/14/18 1351    Code Status History    Date Active Date Inactive Code Status Order ID Comments User Context   02/08/2018 1835 02/09/2018 1920 Full Code [id number]  Altamese DillingVachhani, Vaibhavkumar, MD Inpatient   02/05/2018 2025 02/07/2018 2119 Full Code [id number]  Milagros LollSudini, Srikar, MD ED   04/29/2017 1314 05/02/2017 2148 Full Code [id number]   Katha HammingKonidena, Snehalatha, MD ED   12/23/2016 1745 12/25/2016 1849 Full Code [id number]  Katharina CaperVaickute, Rima, MD ED   11/06/2016 0900 11/07/2016 1853 Full Code [id number]  Delfino LovettShah, Vipul, MD Inpatient   11/05/2016 1401 11/06/2016 0900 Partial Code [id number]  Adrian SaranMody, Sital, MD Inpatient   06/03/2016 0253 06/05/2016 1945 Full Code [id number]  Gery Prayrosley, Debby, MD Inpatient   11/19/2015 1218 11/24/2015 1624 Full Code [id number]  Shaune Pollackhen, Qing, MD Inpatient   11/09/2015 2302 11/13/2015 1851 Full Code [id number]  Wyatt HasteHower, David K, MD ED   10/17/2015 2226 10/22/2015 1303 Full Code [id number]  Enedina FinnerPatel, Sona, MD Inpatient   10/09/2015 1738 10/12/2015 1934 Full Code [id number]  Shaune Pollackhen, Qing, MD Inpatient   08/01/2015 0008 08/03/2015 1454 Full Code [id number]  Oralia ManisWillis, David, MD Inpatient    Advance Directive Documentation     Most Recent Value  Type of Advance Directive  Living will  Pre-existing out of facility DNR order (yellow form or pink MOST form)  -  "MOST" Form in Place?  -     Family Communication: Family at bedside Disposition Plan: To be determined  Antibiotics:  Unasyn  Time spent: 28 minutes  Mahrosh Donnell [employer]  [employer]

## 2018-05-17 LAB — VITAMIN B12: Vitamin B-12: 148 pg/mL — ABNORMAL LOW (ref 180–914)

## 2018-05-17 MED ORDER — SODIUM CHLORIDE 0.9% FLUSH: 10.0000 mL | INTRAVENOUS | Status: DC | PRN

## 2018-05-17 MED ORDER — AMLODIPINE BESYLATE 5 MG PO TABS
5.0000 mg | ORAL_TABLET | Freq: Every day | ORAL | Status: DC
  Administered 2018-05-17 – 2018-05-21 (×4): 5 mg via ORAL
  Filled 2018-05-17 (×5): qty 1

## 2018-05-17 MED ORDER — CYANOCOBALAMIN 1000 MCG/ML IJ SOLN
1000.0000 ug | Freq: Every day | INTRAMUSCULAR | Status: DC
  Administered 2018-05-17 – 2018-05-20 (×4): 1000 ug via INTRAMUSCULAR
  Filled 2018-05-17 (×5): qty 1

## 2018-05-17 MED ORDER — FAMOTIDINE IN NACL 20-0.9 MG/50ML-% IV SOLN
20.0000 mg | INTRAVENOUS | Status: DC
  Administered 2018-05-17 – 2018-05-21 (×5): 20 mg via INTRAVENOUS
  Filled 2018-05-17 (×5): qty 50

## 2018-05-17 NOTE — Plan of Care (Signed)

## 2018-05-17 NOTE — Progress Notes (Signed)
Pt complaining of nausea. Phenergan 12.5 mg IVP given.  Pt refused oral medication at this time. Henriette CombsSarah Nohemi Nicklaus RN

## 2018-05-17 NOTE — Progress Notes (Signed)
Patient ID: Cheryl [name], female   DOB: [birth year], 67 y.o.   MRN: [id number]  [employer] PROGRESS NOTE  Cheryl [name] WUJ:[id number] DOB: Dec 17, 1950 DOA: 05/14/2018 PCP: Malva Limes, MD  HPI/Subjective: Patient not in any pain in the back but complaining of abdominal pain nausea vomiting.  Still has not eaten much.  With cough shortness of breath and sputum production.  Objective: Vitals:   05/17/18 1317 05/17/18 1319  BP: (!) 180/86 (!) 174/80  Pulse: 85 75  Resp: 20   Temp:    SpO2: 90%     Intake/Output Summary (Last 24 hours) at 05/17/2018 1401 Last data filed at 05/17/2018 1019 Gross per 24 hour  Intake 1453.33 ml  Output 200 ml  Net 1253.33 ml   Filed Weights   05/14/18 1022  Weight: 40.8 kg (90 lb)    ROS: Review of Systems  Constitutional: Negative for chills and fever.  Eyes: Negative for blurred vision.  Respiratory: Positive for cough, sputum production and shortness of breath.   Cardiovascular: Negative for chest pain.  Gastrointestinal: Positive for abdominal pain, nausea and vomiting. Negative for constipation and diarrhea.  Genitourinary: Negative for dysuria.  Musculoskeletal: Negative for joint pain.  Neurological: Negative for dizziness and headaches.   Exam: Physical Exam  Constitutional: She is oriented to person, place, and time.  HENT:  Nose: No mucosal edema.  Mouth/Throat: No oropharyngeal exudate or posterior oropharyngeal edema.  Eyes: Pupils are equal, round, and reactive to light. Conjunctivae, EOM and lids are normal.  Neck: No JVD present. Carotid bruit is not present. No edema present. No thyroid mass and no thyromegaly present.  Cardiovascular: S1 normal and S2 normal. Exam reveals no gallop.  No murmur heard. Pulses:      Dorsalis pedis pulses are 2+ on the right side, and 2+ on the left side.  Respiratory: No respiratory distress. She has decreased breath sounds in the right lower field and the left lower field.  She has no wheezes. She has rhonchi in the right lower field and the left lower field. She has no rales.  GI: Soft. Bowel sounds are normal. There is tenderness in the epigastric area.  Musculoskeletal:       Right ankle: She exhibits no swelling.       Left ankle: She exhibits no swelling.  Lymphadenopathy:    She has no cervical adenopathy.  Neurological: She is alert and oriented to person, place, and time. No cranial nerve deficit.  Skin: Skin is warm. No rash noted. Nails show no clubbing.  Psychiatric: She has a normal mood and affect.      Data Reviewed: Basic Metabolic Panel: Recent Labs  Lab 05/14/18 1033 05/15/18 0257  NA 138 140  K 3.6 3.7  CL 103 109  CO2 26 24  GLUCOSE 95 97  BUN 25* 15  CREATININE 1.12* 0.68  CALCIUM 8.5* 7.5*   Liver Function Tests: Recent Labs  Lab 05/14/18 1033  AST 21  ALT 10*  ALKPHOS 71  BILITOT 0.5  PROT 6.7  ALBUMIN 3.4*   CBC: Recent Labs  Lab 05/14/18 1033 05/15/18 0257 05/16/18 0334  WBC 11.0 13.3* 9.2  NEUTROABS 9.8*  --   --   HGB 9.9* 7.5* 7.8*  HCT 30.5* 24.1* 23.7*  MCV 80.7 82.3 81.6  PLT 170 146* 140*   Cardiac Enzymes: Recent Labs  Lab 05/14/18 1033  TROPONINI 0.03*    CBG: Recent Labs  Lab 05/14/18 1016  GLUCAP 95  Recent Results (from the past 240 hour(s))  Blood Culture (routine x 2)     Status: None (Preliminary result)   Collection Time: 05/14/18 10:33 AM  Result Value Ref Range Status   Specimen Description BLOOD RIGHT ANTECUBITAL  Final   Special Requests   Final    BOTTLES DRAWN AEROBIC AND ANAEROBIC Blood Culture adequate volume   Culture   Final    NO GROWTH 3 DAYS Performed at [facility], [address] [place], [place] [address]    Report Status PENDING  Incomplete  Blood Culture (routine x 2)     Status: None (Preliminary result)   Collection Time: 05/14/18 10:33 AM  Result Value Ref Range Status   Specimen Description BLOOD BLOOD LEFT WRIST  Final   Special  Requests   Final    BOTTLES DRAWN AEROBIC AND ANAEROBIC Blood Culture results may not be optimal due to an inadequate volume of blood received in culture bottles   Culture   Final    NO GROWTH 3 DAYS Performed at [facility], [address], [place], [place] [address]    Report Status PENDING  Incomplete  Urine culture     Status: None   Collection Time: 05/14/18 10:33 AM  Result Value Ref Range Status   Specimen Description   Final    URINE, RANDOM Performed at [facility], [address] [place], [place] [address]    Special Requests   Final    NONE Performed at [facility], [address] [place], [place] [address]    Culture   Final    NO GROWTH Performed at [facility], [address] [place], [place] [address]    Report Status 05/15/2018 FINAL  Final  Culture, expectorated sputum-assessment     Status: None   Collection Time: 05/16/18 11:46 AM  Result Value Ref Range Status   Specimen Description SPUTUM  Final   Special Requests NONE  Final   Sputum evaluation   Final    THIS SPECIMEN IS ACCEPTABLE FOR SPUTUM CULTURE Performed at [facility], [address], [place], [place] [address]    Report Status 05/16/2018 FINAL  Final  Culture, respiratory (NON-Expectorated)     Status: None (Preliminary result)   Collection Time: 05/16/18 11:46 AM  Result Value Ref Range Status   Specimen Description   Final    SPUTUM Performed at [facility], [address] [place], [place] [address]    Special Requests   Final    NONE Reflexed from [id number] [id number] Performed at [facility], [address] [place], [place] [address]    Gram Stain   Final    FEW WBC PRESENT,BOTH PMN AND MONONUCLEAR RARE SQUAMOUS EPITHELIAL CELLS PRESENT NO ORGANISMS SEEN    Culture   Final    TOO YOUNG TO READ Performed at [facility], [address] [place], [place] [address]    Report Status PENDING  Incomplete      Studies: Dg Chest 2 View  Result Date: 05/16/2018 CLINICAL DATA:  Cough; Pt. admitted 05-14-18 due to AMS, lethargy, confusion. Suspected to have underlying aspiration pneumonia but chest x-ray has been negative. She did have a low-grade fever admission.Hx/o COPD, CAD, DVTSmoker EXAM: CHEST - 2 VIEW COMPARISON:  05/14/2018 and older exams. FINDINGS: Cardiac silhouette is normal in size. No mediastinal or hilar masses. No convincing adenopathy. Small to moderate hiatal hernia. There is opacity at the posterior left lung  base, mildly increased when compared to the study dated 02/08/2018. This may reflect pneumonia or atelectasis, the latter favored. There are prominent bronchovascular markings. No other evidence pneumonia. No evidence of pulmonary edema. No pleural effusion or pneumothorax. Skeletal structures are demineralized but grossly intact. IMPRESSION: 1. Left posterior lower lobe opacity mildly increased when compared to a chest radiograph dated 02/08/2018. This may reflect pneumonia or be due to atelectasis. 2. No other evidence of acute cardiopulmonary disease. Electronically Signed   By: Amie Portland M.D.   On: 05/16/2018 13:30    Scheduled Meds: . clopidogrel  75 mg Oral Daily  . donepezil  10 mg Oral QHS  . DULoxetine  30 mg Oral Daily  . enoxaparin (LOVENOX) injection  30 mg Subcutaneous Q24H  . fentaNYL  50 mcg Transdermal Q72H  . nicotine  21 mg Transdermal Daily  . pantoprazole  40 mg Oral BID  . simvastatin  10 mg Oral QHS   Continuous Infusions: . ampicillin-sulbactam (UNASYN) IV Stopped (05/17/18 0845)  . famotidine (PEPCID) IV Stopped (05/17/18 1025)    Assessment/Plan:  1. Epigastric abdominal pain, nausea and vomiting.  I added Pepcid IV. Already on Protonix.  PRN nausea medications.   2. Acute encephalopathy secondary to pneumonia.  This has improved.  Watch closely with restarting fentanyl patch.  3. Pneumonia with slight temperature on presentation.  Continue  Unasyn.  Declined swallow evaluation. 4. History of COPD.  Continue nebulizers. 5. Hyperlipidemia unspecified on simvastatin 6. Depression on Cymbalta 7. GERD on Protonix 8. Chronic back pain on fentanyl patch 9. Chronic anemia.  Guaiac stool.  Patient had endoscopy and colonoscopy 2 years ago that was negative.  Can consider a small bowel study as outpatient. 10. Weakness.  Physical therapy evaluation. 11. B12 deficiency give IM B12  Code Status:     Code Status Orders  (From admission, onward)        Start     Ordered   05/14/18 1352  Limited resuscitation (code)  Continuous    Question Answer Comment  In the event of cardiac or respiratory ARREST: Initiate Code Blue, Call Rapid Response Yes   In the event of cardiac or respiratory ARREST: Perform CPR Yes   In the event of cardiac or respiratory ARREST: Perform Intubation/Mechanical Ventilation No   In the event of cardiac or respiratory ARREST: Use NIPPV/BiPAp only if indicated Yes   In the event of cardiac or respiratory ARREST: Administer ACLS medications if indicated Yes   In the event of cardiac or respiratory ARREST: Perform Defibrillation or Cardioversion if indicated Yes      05/14/18 1351    Code Status History    Date Active Date Inactive Code Status Order ID Comments User Context   02/08/2018 1835 02/09/2018 1920 Full Code [id number]  Altamese Dilling, MD Inpatient   02/05/2018 2025 02/07/2018 2119 Full Code [id number]  Milagros Loll, MD ED   04/29/2017 1314 05/02/2017 2148 Full Code [id number]  Katha Hamming, MD ED   12/23/2016 1745 12/25/2016 1849 Full Code [id number]  Katharina Caper, MD ED   11/06/2016 0900 11/07/2016 1853 Full Code [id number]  Delfino Lovett, MD Inpatient   11/05/2016 1401 11/06/2016 0900 Partial Code [id number]  Adrian Saran, MD Inpatient   06/03/2016 0253 06/05/2016 1945 Full Code [id number]  Gery Pray, MD Inpatient   11/19/2015 1218 11/24/2015 1624 Full Code [id number]  Shaune Pollack, MD Inpatient    11/09/2015 2302 11/13/2015 1851 Full Code [id number]  Hower, Cletis Athens, MD ED   10/17/2015  2226 10/22/2015 1303 Full Code [id number]  Enedina FinnerPatel, Sona, MD Inpatient   10/09/2015 1738 10/12/2015 1934 Full Code [id number]  Shaune Pollackhen, Qing, MD Inpatient   08/01/2015 0008 08/03/2015 1454 Full Code [id number]  Oralia ManisWillis, David, MD Inpatient    Advance Directive Documentation     Most Recent Value  Type of Advance Directive  Living will  Pre-existing out of facility DNR order (yellow form or pink MOST form)  -  "MOST" Form in Place?  -     Family Communication: Husband on the phone Disposition Plan: To be determined  Antibiotics:  Unasyn  Time spent: 27 minutes  Cheryl [name] [employer]  [employer]

## 2018-05-18 ENCOUNTER — Other Ambulatory Visit: Payer: Self-pay | Admitting: Family Medicine

## 2018-05-18 ENCOUNTER — Inpatient Hospital Stay: Payer: Medicare HMO

## 2018-05-18 DIAGNOSIS — R112 Nausea with vomiting, unspecified: Secondary | ICD-10-CM

## 2018-05-18 LAB — COMPREHENSIVE METABOLIC PANEL
ALK PHOS: 78 U/L (ref 38–126)
ALT: 9 U/L — ABNORMAL LOW (ref 14–54)
AST: 12 U/L — ABNORMAL LOW (ref 15–41)
Albumin: 2.8 g/dL — ABNORMAL LOW (ref 3.5–5.0)
Anion gap: 14 (ref 5–15)
BILIRUBIN TOTAL: 0.8 mg/dL (ref 0.3–1.2)
BUN: 19 mg/dL (ref 6–20)
CALCIUM: 8.1 mg/dL — AB (ref 8.9–10.3)
CO2: 20 mmol/L — ABNORMAL LOW (ref 22–32)
CREATININE: 0.66 mg/dL (ref 0.44–1.00)
Chloride: 102 mmol/L (ref 101–111)
GFR calc Af Amer: >60 mL/min (ref >60)
Glucose, Bld: 79 mg/dL (ref 65–99)
Potassium: 3 mmol/L — ABNORMAL LOW (ref 3.5–5.1)
Sodium: 136 mmol/L (ref 135–145)
Total Protein: 6 g/dL — ABNORMAL LOW (ref 6.5–8.1)

## 2018-05-18 LAB — GLUCOSE, CAPILLARY: GLUCOSE-CAPILLARY: 286 mg/dL — AB (ref 65–99)

## 2018-05-18 LAB — HEMOGLOBIN: Hemoglobin: 9.1 g/dL — ABNORMAL LOW (ref 12.0–16.0)

## 2018-05-18 LAB — LIPASE, BLOOD: LIPASE: 22 U/L (ref 11–51)

## 2018-05-18 MED ORDER — SODIUM CHLORIDE 0.9 % IV SOLN
INTRAVENOUS | Status: DC
  Administered 2018-05-18 – 2018-05-19 (×2): via INTRAVENOUS

## 2018-05-18 MED ORDER — POTASSIUM CHLORIDE CRYS ER 20 MEQ PO TBCR
40.0000 meq | EXTENDED_RELEASE_TABLET | Freq: Once | ORAL | Status: AC
Start: 2018-05-18 — End: 2018-05-18
  Administered 2018-05-18: 10:00:00 40 meq via ORAL
  Filled 2018-05-18: qty 2

## 2018-05-18 NOTE — Plan of Care (Signed)
  Problem: Education: Goal: Knowledge of General Education information will improve Outcome: Progressing   Problem: Clinical Measurements: Goal: Diagnostic test results will improve Outcome: Progressing Goal: Respiratory complications will improve Outcome: Progressing Goal: Cardiovascular complication will be avoided Outcome: Progressing   Problem: Elimination: Goal: Will not experience complications related to urinary retention Outcome: Progressing   Problem: Skin Integrity: Goal: Risk for impaired skin integrity will decrease Outcome: Progressing

## 2018-05-18 NOTE — Progress Notes (Signed)
Patient ID: Cheryl [name], female   DOB: Feb 02, 1951, 67 y.o.   MRN: [id number]  [employer] PROGRESS NOTE  Cheryl [name] WUJ:[id number] DOB: 1951-07-20 DOA: 05/14/2018 PCP: Malva Limes, MD  HPI/Subjective: Patient still unable to eat.  With nausea but no vomiting today.  Still with some epigastric abdominal pain.  Still with a little cough and sputum.   Objective: Vitals:   05/18/18 0338 05/18/18 0945  BP: 134/61 132/72  Pulse: 76   Resp: 16   Temp: 98.8 F (37.1 C)   SpO2: 96%     Filed Weights   05/14/18 1022  Weight: 40.8 kg (90 lb)    ROS: Review of Systems  Constitutional: Negative for chills and fever.  Eyes: Negative for blurred vision.  Respiratory: Positive for cough and sputum production. Negative for shortness of breath.   Cardiovascular: Negative for chest pain.  Gastrointestinal: Positive for abdominal pain and nausea. Negative for constipation, diarrhea and vomiting.  Genitourinary: Negative for dysuria.  Musculoskeletal: Negative for joint pain.  Neurological: Negative for dizziness and headaches.   Exam: Physical Exam  Constitutional: She is oriented to person, place, and time.  HENT:  Nose: No mucosal edema.  Mouth/Throat: No oropharyngeal exudate or posterior oropharyngeal edema.  Eyes: Pupils are equal, round, and reactive to light. Conjunctivae, EOM and lids are normal.  Neck: No JVD present. Carotid bruit is not present. No edema present. No thyroid mass and no thyromegaly present.  Cardiovascular: S1 normal and S2 normal. Exam reveals no gallop.  No murmur heard. Pulses:      Dorsalis pedis pulses are 2+ on the right side, and 2+ on the left side.  Respiratory: No respiratory distress. She has decreased breath sounds in the right lower field and the left lower field. She has no wheezes. She has rhonchi in the right lower field and the left lower field. She has no rales.  GI: Soft. Bowel sounds are normal. There is tenderness in the  epigastric area.  Musculoskeletal:       Right ankle: She exhibits no swelling.       Left ankle: She exhibits no swelling.  Lymphadenopathy:    She has no cervical adenopathy.  Neurological: She is alert and oriented to person, place, and time. No cranial nerve deficit.  Skin: Skin is warm. No rash noted. Nails show no clubbing.  Psychiatric: She has a normal mood and affect.      Data Reviewed: Basic Metabolic Panel: Recent Labs  Lab 05/14/18 1033 05/15/18 0257 05/18/18 0316  NA 138 140 136  K 3.6 3.7 3.0*  CL 103 109 102  CO2 26 24 20*  GLUCOSE 95 97 79  BUN 25* 15 19  CREATININE 1.12* 0.68 0.66  CALCIUM 8.5* 7.5* 8.1*   Liver Function Tests: Recent Labs  Lab 05/14/18 1033 05/18/18 0316  AST 21 12*  ALT 10* 9*  ALKPHOS 71 78  BILITOT 0.5 0.8  PROT 6.7 6.0*  ALBUMIN 3.4* 2.8*   CBC: Recent Labs  Lab 05/14/18 1033 05/15/18 0257 05/16/18 0334 05/18/18 0316  WBC 11.0 13.3* 9.2  --   NEUTROABS 9.8*  --   --   --   HGB 9.9* 7.5* 7.8* 9.1*  HCT 30.5* 24.1* 23.7*  --   MCV 80.7 82.3 81.6  --   PLT 170 146* 140*  --    Cardiac Enzymes: Recent Labs  Lab 05/14/18 1033  TROPONINI 0.03*    CBG: Recent Labs  Lab 05/14/18  1016 05/17/18 0006  GLUCAP 95 286*    Recent Results (from the past 240 hour(s))  Blood Culture (routine x 2)     Status: None (Preliminary result)   Collection Time: 05/14/18 10:33 AM  Result Value Ref Range Status   Specimen Description BLOOD RIGHT ANTECUBITAL  Final   Special Requests   Final    BOTTLES DRAWN AEROBIC AND ANAEROBIC Blood Culture adequate volume   Culture   Final    NO GROWTH 4 DAYS Performed at [facility], [address] [place], [place] [address]    Report Status PENDING  Incomplete  Blood Culture (routine x 2)     Status: None (Preliminary result)   Collection Time: 05/14/18 10:33 AM  Result Value Ref Range Status   Specimen Description BLOOD BLOOD LEFT WRIST  Final   Special Requests   Final     BOTTLES DRAWN AEROBIC AND ANAEROBIC Blood Culture results may not be optimal due to an inadequate volume of blood received in culture bottles   Culture   Final    NO GROWTH 4 DAYS Performed at [facility], [address] [place], [place] [address]    Report Status PENDING  Incomplete  Urine culture     Status: None   Collection Time: 05/14/18 10:33 AM  Result Value Ref Range Status   Specimen Description   Final    URINE, RANDOM Performed at [facility], [address] [place], [place] [address]    Special Requests   Final    NONE Performed at [facility], [address] [place], [place] [address]    Culture   Final    NO GROWTH Performed at [facility], [address] [place], [place] [address]    Report Status 05/15/2018 FINAL  Final  Culture, expectorated sputum-assessment     Status: None   Collection Time: 05/16/18 11:46 AM  Result Value Ref Range Status   Specimen Description SPUTUM  Final   Special Requests NONE  Final   Sputum evaluation   Final    THIS SPECIMEN IS ACCEPTABLE FOR SPUTUM CULTURE Performed at [facility], [address] [place], [place] [address]    Report Status 05/16/2018 FINAL  Final  Culture, respiratory (NON-Expectorated)     Status: None (Preliminary result)   Collection Time: 05/16/18 11:46 AM  Result Value Ref Range Status   Specimen Description   Final    SPUTUM Performed at [facility], [address] [place], [place] [address]    Special Requests   Final    NONE Reflexed from [phone] [id number] Performed at [facility], [address] [place], [place] [address]    Gram Stain   Final    FEW WBC PRESENT,BOTH PMN AND MONONUCLEAR RARE SQUAMOUS EPITHELIAL CELLS PRESENT NO ORGANISMS SEEN    Culture   Final    TOO YOUNG TO READ Performed at [facility], [address] [place], [place] [address]    Report Status PENDING  Incomplete     Studies: Mr Brain 37Wo  Contrast  Result Date: 05/18/2018 CLINICAL DATA:  Lethargy and confusion, which began recently. EXAM: MRI HEAD WITHOUT CONTRAST TECHNIQUE: Multiplanar, multiecho pulse sequences of the brain and surrounding structures were obtained without intravenous contrast. COMPARISON:  CT head 09/12/2017.  MR head 11/26/2012. FINDINGS: Brain: No evidence for acute infarction, hemorrhage, mass lesion, hydrocephalus, or extra-axial fluid. Normal cerebral volume. Moderately advanced T2 and FLAIR hyperintensities throughout  the white matter, likely small vessel disease. Vascular: Flow voids are maintained throughout the carotid, basilar, and vertebral arteries. There are no areas of chronic hemorrhage. Skull and upper cervical spine: Unremarkable visualized calvarium, skullbase, and cervical vertebrae. Pituitary, pineal, cerebellar tonsils unremarkable. No upper cervical cord lesions. Sinuses/Orbits: No orbital masses or proptosis. Globes appear symmetric. Sinuses appear well aerated, without evidence for air-fluid level. Other: None. IMPRESSION: Normal for age cerebral volume. Moderately advanced small vessel disease, similar to prior MR. No acute intracranial findings. Electronically Signed   By: Elsie Stain M.D.   On: 05/18/2018 14:52    Scheduled Meds: . amLODipine  5 mg Oral Daily  . clopidogrel  75 mg Oral Daily  . cyanocobalamin  1,000 mcg Intramuscular Daily  . donepezil  10 mg Oral QHS  . DULoxetine  30 mg Oral Daily  . enoxaparin (LOVENOX) injection  30 mg Subcutaneous Q24H  . fentaNYL  50 mcg Transdermal Q72H  . nicotine  21 mg Transdermal Daily  . pantoprazole  40 mg Oral BID  . simvastatin  10 mg Oral QHS   Continuous Infusions: . sodium chloride 40 mL/hr at 05/18/18 1120  . ampicillin-sulbactam (UNASYN) IV 3 g (05/18/18 1455)  . famotidine (PEPCID) IV Stopped (05/18/18 1016)    Assessment/Plan:  1. Epigastric abdominal pain, persistent nausea.  Unable to eat.  I added Pepcid IV yesterday.    Already on Protonix.  PRN nausea medications.  MRI of the brain negative for stroke so this is not a neurological cause of her symptoms.  Case discussed with gastroenterology Dr. Servando Snare to see the patient.  Patient had an upper endoscopy in 2016 that was listed as normal.  Hold Plavix just in case to endoscopy.  The patient had a CT scan of the abdomen pelvis in 2018 that only showed stable ductal dilation.  Otherwise negative exam.  Can consider a CT Angie of the abdomen to rule out ischemic colitis but I would wait for GI opinion first. 2. Acute encephalopathy secondary to pneumonia.  This has improved.  3. Pneumonia with slight temperature on presentation.  Continue Unasyn.  Declined swallow evaluation. 4. History of COPD.  Continue nebulizers. 5. Hyperlipidemia unspecified on simvastatin 6. Depression on Cymbalta 7. GERD on Protonix, pepcid 8. Chronic back pain on fentanyl patch 9. Chronic iron deficiency anemia.  Guaiac stool.  Patient had endoscopy and colonoscopy 2 years ago that was negative.  Can consider a small bowel study as outpatient.  Ferritin 43. 10. Weakness.  Physical therapy evaluation. 11. B12 deficiency give IM B12  Code Status:     Code Status Orders  (From admission, onward)        Start     Ordered   05/14/18 1352  Limited resuscitation (code)  Continuous    Question Answer Comment  In the event of cardiac or respiratory ARREST: Initiate Code Blue, Call Rapid Response Yes   In the event of cardiac or respiratory ARREST: Perform CPR Yes   In the event of cardiac or respiratory ARREST: Perform Intubation/Mechanical Ventilation No   In the event of cardiac or respiratory ARREST: Use NIPPV/BiPAp only if indicated Yes   In the event of cardiac or respiratory ARREST: Administer ACLS medications if indicated Yes   In the event of cardiac or respiratory ARREST: Perform Defibrillation or Cardioversion if indicated Yes      05/14/18 1351    Code Status History    Date  Active Date Inactive Code Status Order ID Comments  User Context   02/08/2018 1835 02/09/2018 1920 Full Code [id number]  Altamese Dilling, MD Inpatient   02/05/2018 2025 02/07/2018 2119 Full Code [id number]  Milagros Loll, MD ED   04/29/2017 1314 05/02/2017 2148 Full Code [id number]  Katha Hamming, MD ED   12/23/2016 1745 12/25/2016 1849 Full Code [id number]  Katharina Caper, MD ED   11/06/2016 0900 11/07/2016 1853 Full Code [id number]  Delfino Lovett, MD Inpatient   11/05/2016 1401 11/06/2016 0900 Partial Code [id number]  Adrian Saran, MD Inpatient   06/03/2016 0253 06/05/2016 1945 Full Code [id number]  Gery Pray, MD Inpatient   11/19/2015 1218 11/24/2015 1624 Full Code [id number]  Shaune Pollack, MD Inpatient   11/09/2015 2302 11/13/2015 1851 Full Code [id number]  Wyatt Haste, MD ED   10/17/2015 2226 10/22/2015 1303 Full Code [id number]  Enedina Finner, MD Inpatient   10/09/2015 1738 10/12/2015 1934 Full Code [id number]  Shaune Pollack, MD Inpatient   08/01/2015 0008 08/03/2015 1454 Full Code [id number]  Oralia Manis, MD Inpatient    Advance Directive Documentation     Most Recent Value  Type of Advance Directive  Living will  Pre-existing out of facility DNR order (yellow form or pink MOST form)  -  "MOST" Form in Place?  -     Family Communication: Husband  at the bedside Disposition Plan: To be determined  Antibiotics:  Unasyn  Time spent: 26 minutes  [place] [employer]

## 2018-05-18 NOTE — Progress Notes (Signed)
Pharmacy Antibiotic Note  Cheryl MargaritaMary George [name] is a 67 y.o. female admitted on 05/14/2018 with aspiration pneumonia.  Pharmacy has been consulted for unasyn dosing.  Plan: Continue Unasyn 3 g IV every 6 hours. Patient is feeling better after IV Pepcid, which will continue for today due to patient not wanting to swallow. Swallow eval refused. GI work up deferred.   Height: 4\' 10"  (147.3 cm) Weight: 90 lb (40.8 kg) IBW/kg (Calculated) : 40.9  Temp (24hrs), Avg:98.7 F (37.1 C), Min:98.6 F (37 C), Max:98.8 F (37.1 C)  Recent Labs  Lab 05/14/18 1033 05/15/18 0257 05/16/18 0334 05/18/18 0316  WBC 11.0 13.3* 9.2  --   CREATININE 1.12* 0.68  --  0.66  LATICACIDVEN 1.1  --   --   --     Estimated Creatinine Clearance: 42.7 mL/min (by C-G formula based on SCr of 0.66 mg/dL).    Allergies  Allergen Reactions  . Percocet [Oxycodone-Acetaminophen] Hives and Rash  . Aspirin Nausea And Vomiting and Other (See Comments)    Reaction:  GI upset   . Codeine Nausea And Vomiting, Other (See Comments) and Nausea Only    Reaction:  GI upset   . Propoxyphene Other (See Comments) and Nausea Only    GI upset Reaction:  GI upset   . Sulfa Antibiotics Rash and Other (See Comments)    Reaction:  GI upset     Antimicrobials this admission: Anti-infectives (From admission, onward)   Start     Dose/Rate Route Frequency Ordered Stop   05/14/18 1400  Ampicillin-Sulbactam (UNASYN) 3 g in sodium chloride 0.9 % 100 mL IVPB     3 g 200 mL/hr over 30 Minutes Intravenous Every 6 hours 05/14/18 1354     05/14/18 1030  vancomycin (VANCOCIN) IVPB 1000 mg/200 mL premix     1,000 mg 200 mL/hr over 60 Minutes Intravenous  Once 05/14/18 1017 05/14/18 1215   05/14/18 1030  piperacillin-tazobactam (ZOSYN) IVPB 3.375 g     3.375 g 100 mL/hr over 30 Minutes Intravenous  Once 05/14/18 1017 05/14/18 1127       Microbiology results: Recent Results (from the past 240 hour(s))  Blood Culture (routine x 2)      Status: None (Preliminary result)   Collection Time: 05/14/18 10:33 AM  Result Value Ref Range Status   Specimen Description BLOOD RIGHT ANTECUBITAL  Final   Special Requests   Final    BOTTLES DRAWN AEROBIC AND ANAEROBIC Blood Culture adequate volume   Culture   Final    NO GROWTH 4 DAYS Performed at [facility], [address] [place], [place] [address]    Report Status PENDING  Incomplete  Blood Culture (routine x 2)     Status: None (Preliminary result)   Collection Time: 05/14/18 10:33 AM  Result Value Ref Range Status   Specimen Description BLOOD BLOOD LEFT WRIST  Final   Special Requests   Final    BOTTLES DRAWN AEROBIC AND ANAEROBIC Blood Culture results may not be optimal due to an inadequate volume of blood received in culture bottles   Culture   Final    NO GROWTH 4 DAYS Performed at [facility], [address] [place], [place] [address]    Report Status PENDING  Incomplete  Urine culture     Status: None   Collection Time: 05/14/18 10:33 AM  Result Value Ref Range Status   Specimen Description   Final    URINE, RANDOM Performed at [facility]  [facility], [address] [place], [place] [address]    Special Requests   Final    NONE Performed at [facility], [address], [place], [place] [address]    Culture   Final    NO GROWTH Performed at [facility], [address], [place], [place] [address]    Report Status 05/15/2018 FINAL  Final  Culture, expectorated sputum-assessment     Status: None   Collection Time: 05/16/18 11:46 AM  Result Value Ref Range Status   Specimen Description SPUTUM  Final   Special Requests NONE  Final   Sputum evaluation   Final    THIS SPECIMEN IS ACCEPTABLE FOR SPUTUM CULTURE Performed at [facility], [address], [place], [place] [address]    Report Status 05/16/2018 FINAL  Final  Culture, respiratory (NON-Expectorated)     Status: None (Preliminary result)   Collection  Time: 05/16/18 11:46 AM  Result Value Ref Range Status   Specimen Description   Final    SPUTUM Performed at [facility], [address], [place], [place] [address]    Special Requests   Final    NONE Reflexed from [id number] Performed at [facility], [address] [place], [place] [address]    Gram Stain   Final    FEW WBC PRESENT,BOTH PMN AND MONONUCLEAR RARE SQUAMOUS EPITHELIAL CELLS PRESENT NO ORGANISMS SEEN    Culture   Final    TOO YOUNG TO READ Performed at [facility], [address], [place], [place] [address]    Report Status PENDING  Incomplete    Thank you for allowing pharmacy to be a part of this patient's care.  Carola Frost, PharmD, BCPS Clinical Pharmacist 05/18/2018 2:35 PM

## 2018-05-18 NOTE — Consult Note (Signed)
Cheryl Lame, MD [facility]  [address] [place], [place] [address] Phone: [phone] Fax : [phone]  Consultation  Referring Provider:     Dr. Leslye Peer Primary Care Physician:  Birdie Sons, MD Primary Gastroenterologist:  Althia Forts        Reason for Consultation:     Nausea  Date of Admission:  05/14/2018 Date of Consultation:  05/18/2018         HPI:   Cheryl [name] is a 67 y.o. female Who is admitted with shortness of breath.  The patient has a history of oxygen dependency at home and COPD.  The patient continues to smoke.  Since admission the patient has had nausea with inability to eat.  The family denies that the patient has had any significant weight loss.  She also reports some abdominal pain in the epigastric area.  There is no report of any black stools or bloody stools. The patient had an EGD back in September 2016 that was reported to be normal by Dr. Candace Cruise. Her colonoscopy did not show any polyps and it was reported that she needed another colonoscopy in 10 years.  The patient states that she thinks her abdominal pain is related to her nausea and vomiting.  She believes she may have pulled a muscle.  Past Medical History:  Diagnosis Date  . Allergy   . Anxiety   . Arthritis   . Aspiration pneumonitis ([place]) 11/24/2015  . Asthma   . Chronic pain   . COPD (chronic obstructive pulmonary disease) ([place])   . Coronary artery disease   . DVT (deep venous thrombosis) ([place])   . GERD (gastroesophageal reflux disease)   . Headache   . Hyperlipidemia   . Hypertension   . Kyphoscoliosis deformity of spine   . Migraines   . Neuropathy 2010  . Osteoporosis   . Oxygen deficiency   . Peripheral vascular disease ([place])   . Pneumonia   . Pneumonia 11/19/2015  . Pneumonia 10/2016  . Pneumonia    aspiration 2018- 4 times in last year  . Vitamin D deficiency     Past Surgical History:  Procedure Laterality Date  . ABDOMINAL HYSTERECTOMY  1975   Bilaterl  Oophorectomy; [place] to IUD infection  . abdomnal aortic stent  05/30/2008   Dr. Quay Burow  . APPENDECTOMY    . APPENDECTOMY    . cardiac catherization  10/31/2009  . CERVICAL FUSION  C5 - 6/C6-7  . CHOLECYSTECTOMY  1972  . COLONOSCOPY WITH PROPOFOL N/A 07/27/2015   Procedure: COLONOSCOPY WITH PROPOFOL;  Surgeon: Hulen Luster, MD;  Location: [facility] ENDOSCOPY;  Service: Gastroenterology;  Laterality: N/A;  . ESOPHAGOGASTRODUODENOSCOPY (EGD) WITH PROPOFOL N/A 07/27/2015   Procedure: ESOPHAGOGASTRODUODENOSCOPY (EGD) WITH PROPOFOL;  Surgeon: Hulen Luster, MD;  Location: [facility] ENDOSCOPY;  Service: Gastroenterology;  Laterality: N/A;  . FOOT SURGERY Bilateral    5-6 years per patient  . SPINE SURGERY      Prior to Admission medications   Medication Sig Start Date End Date Taking? Authorizing Provider  acetaminophen (TYLENOL) 325 MG tablet Take 2 tablets (650 mg total) by mouth every 6 (six) hours as needed for mild pain (or Fever >/= 101). 12/25/16  Yes Gladstone Lighter, MD  albuterol (PROVENTIL) (2.5 MG/3ML) 0.083% nebulizer solution Take 3 mLs (2.5 mg total) by nebulization every 4 (four) hours as needed for wheezing. 12/24/17  Yes Birdie Sons, MD  alendronate (FOSAMAX) 70 MG tablet Take 1 tablet (70 mg  total) by mouth every 7 (seven) days. Take with a full glass of water on an empty stomach. 01/23/18  Yes Birdie Sons, MD  ALPRAZolam Duanne Moron) 0.5 MG tablet take 1 tablet by mouth three times a day if needed 01/23/18  Yes Fisher, Kirstie Peri, MD  celecoxib (CELEBREX) 100 MG capsule Take 1 capsule (100 mg total) by mouth 2 (two) times daily as needed. 01/23/18  Yes Birdie Sons, MD  clopidogrel (PLAVIX) 75 MG tablet Take 75 mg by mouth daily.   Yes [provider]  cyclobenzaprine (FLEXERIL) 5 MG tablet Take 1 tablet (5 mg total) by mouth 3 (three) times daily as needed for muscle spasms. 02/02/18  Yes Birdie Sons, MD  diphenoxylate-atropine (LOMOTIL) 2.5-0.025 MG per tablet Take 2 tablets  by mouth 4 (four) times daily as needed for diarrhea or loose stools. Reported on 03/11/2016   Yes [provider]  donepezil (ARICEPT) 10 MG tablet Take 10 mg by mouth at bedtime.   Yes [provider]  DULoxetine (CYMBALTA) 30 MG capsule Take 1 capsule by mouth daily. 01/26/18  Yes [provider]  estradiol (ESTRACE) 0.5 MG tablet Take 1 tablet (0.5 mg total) by mouth daily. (Take in place of estropipate) 02/26/18  Yes Fisher, Kirstie Peri, MD  fentaNYL (DURAGESIC - DOSED MCG/HR) 50 MCG/HR Place 1 patch (50 mcg total) onto the skin every 3 (three) days. 07/21/18 08/20/18 Yes King, Diona Foley, NP  fentaNYL (DURAGESIC - DOSED MCG/HR) 50 MCG/HR Place 1 patch (50 mcg total) onto the skin every 3 (three) days. 05/22/18 06/21/18 Yes Vevelyn Francois, NP  Fluticasone-Salmeterol (ADVAIR DISKUS) 250-50 MCG/DOSE AEPB inhale 1 dose by mouth twice a day 01/23/18  Yes Birdie Sons, MD  furosemide (LASIX) 40 MG tablet Take 40 mg by mouth every other day.  02/16/17  Yes [provider]  LYRICA 300 MG capsule Take 300 mg by mouth 2 (two) times daily. 04/12/17  Yes [provider]  NARCAN 4 MG/0.1ML LIQD nasal spray kit Place 0.4 mg into the nose once.  09/11/17  Yes [provider]  pantoprazole (PROTONIX) 40 MG tablet Take 1 tablet (40 mg total) by mouth 2 (two) times daily. 01/23/18  Yes Birdie Sons, MD  potassium chloride (K-DUR) 10 MEQ tablet Take 1 tablet by mouth daily. 01/22/18  Yes [provider]  simvastatin (ZOCOR) 10 MG tablet Take 10 mg by mouth at bedtime.    Yes [provider]  tiotropium (SPIRIVA HANDIHALER) 18 MCG inhalation capsule inhale the contents of one capsule in the handihaler once daily 01/23/18  Yes Fisher, Kirstie Peri, MD  traZODone (DESYREL) 50 MG tablet Take 1-2 tablets (50-100 mg total) by mouth at bedtime. 05/01/18  Yes Birdie Sons, MD  triamcinolone ointment (KENALOG) 0.5 % Apply to lesions on hands twice a day as needed  10/24/16  Yes Fisher, Kirstie Peri, MD  VENTOLIN HFA 108 (90 Base) MCG/ACT inhaler Inhale 2 puffs into the lungs every 4 (four) hours as needed for wheezing or shortness of breath. 04/08/18  Yes Birdie Sons, MD  doxycycline (VIBRA-TABS) 100 MG tablet Take 1 tablet (100 mg total) by mouth 2 (two) times daily. Patient not taking: Reported on 05/14/2018 05/01/18   Birdie Sons, MD  fentaNYL (DURAGESIC - DOSED MCG/HR) 50 MCG/HR Place 1 patch (50 mcg total) onto the skin every 3 (three) days. 06/21/18 07/21/18  Vevelyn Francois, NP  mupirocin ointment (BACTROBAN) 2 % Apply 1 application  topically 2 (two) times daily. Patient not taking: Reported on 05/14/2018 02/24/18   Birdie Sons, MD  silver sulfADIAZINE (SILVADENE) 1 % cream Apply topically daily. Patient not taking: Reported on 05/14/2018 01/26/18   Birdie Sons, MD    Family History  Problem Relation Age of Onset  . Cancer Mother   . Arthritis Mother   . Heart disease Mother   . Diabetes Mother        mellitus, type 2  . Heart disease Father   . Diabetes Sister   . Cancer Brother   . Cancer Brother        lung  . Diabetes Brother      Social History   Tobacco Use  . Smoking status: Current Every Day Smoker    Packs/day: 1.00    Years: 50.00    Pack years: 50.00    Types: Cigarettes  . Smokeless tobacco: Never Used  . Tobacco comment: Previously smoked 2 ppd  Substance Use Topics  . Alcohol use: No    Alcohol/week: 0.0 oz  . Drug use: No    Allergies as of 05/14/2018 - Review Complete 05/14/2018  Allergen Reaction Noted  . Percocet [oxycodone-acetaminophen] Hives and Rash 03/30/2015  . Aspirin Nausea And Vomiting and Other (See Comments) 03/30/2015  . Codeine Nausea And Vomiting, Other (See Comments), and Nausea Only 02/10/2014  . Propoxyphene Other (See Comments) and Nausea Only 07/03/2015  . Sulfa antibiotics Rash and Other (See Comments) 02/10/2014    Review of Systems:    All systems reviewed and negative  except where noted in HPI.   Physical Exam:  Vital signs in last 24 hours: Temp:  [98.6 F (37 C)-98.8 F (37.1 C)] 98.8 F (37.1 C) (06/24 0338) Pulse Rate:  [64-76] 76 (06/24 0338) Resp:  [16-18] 16 (06/24 0338) BP: (132-151)/(61-74) 132/72 (06/24 0945) SpO2:  [94 %-96 %] 96 % (06/24 0338) Last BM Date: 05/16/18 General:   Pleasant, cooperative in NAD Head:  Normocephalic and atraumatic. Eyes:   No icterus.   Conjunctiva pink. PERRLA. Ears:  Normal auditory acuity. Neck:  Supple; no masses or thyroidomegaly Lungs: Respirations even and unlabored. Lungs clear to auscultation bilaterally.   No wheezes, crackles, or rhonchi.  Heart:  Regular rate and rhythm;  Without murmur, clicks, rubs or gallops Abdomen:  Soft, nondistended, nontender. Normal bowel sounds. No appreciable masses or hepatomegaly.  No rebound or guarding.  Rectal:  Not performed. Msk:  Symmetrical without gross deformities.    Extremities:  Without edema, cyanosis or clubbing. Neurologic:  Alert and oriented x3;  grossly normal neurologically. Skin:  Intact without significant lesions or rashes. Cervical Nodes:  No significant cervical adenopathy. Psych:  Alert and cooperative. Normal affect.  LAB RESULTS: Recent Labs    05/16/18 0334 05/18/18 0316  WBC 9.2  --   HGB 7.8* 9.1*  HCT 23.7*  --   PLT 140*  --    BMET Recent Labs    05/18/18 0316  NA 136  K 3.0*  CL 102  CO2 20*  GLUCOSE 79  BUN 19  CREATININE 0.66  CALCIUM 8.1*   LFT Recent Labs    05/18/18 0316  PROT 6.0*  ALBUMIN 2.8*  AST 12*  ALT 9*  ALKPHOS 78  BILITOT 0.8   PT/INR No results for input(s): LABPROT, INR in the last 72 hours.  STUDIES: Mr Brain Wo Contrast  Result Date: 05/18/2018 CLINICAL DATA:  Lethargy and confusion, which began recently. EXAM: MRI HEAD  WITHOUT CONTRAST TECHNIQUE: Multiplanar, multiecho pulse sequences of the brain and surrounding structures were obtained without intravenous contrast. COMPARISON:   CT head 09/12/2017.  MR head 11/26/2012. FINDINGS: Brain: No evidence for acute infarction, hemorrhage, mass lesion, hydrocephalus, or extra-axial fluid. Normal cerebral volume. Moderately advanced T2 and FLAIR hyperintensities throughout the white matter, likely small vessel disease. Vascular: Flow voids are maintained throughout the carotid, basilar, and vertebral arteries. There are no areas of chronic hemorrhage. Skull and upper cervical spine: Unremarkable visualized calvarium, skullbase, and cervical vertebrae. Pituitary, pineal, cerebellar tonsils unremarkable. No upper cervical cord lesions. Sinuses/Orbits: No orbital masses or proptosis. Globes appear symmetric. Sinuses appear well aerated, without evidence for air-fluid level. Other: None. IMPRESSION: Normal for age cerebral volume. Moderately advanced small vessel disease, similar to prior MR. No acute intracranial findings. Electronically Signed   By: Staci Righter M.D.   On: 05/18/2018 14:52      Impression / Plan:   Assessment: Active Problems:   Hypoxia   Cheryl [name] is a 67 y.o. y/o female with Nausea that started after she was admitted with hypoxia.  The patient's breathing is much better and she is presently not on any oxygen.  The patient continues to not be able to eat without having nausea and abdominal pain.  The patient's abdominal pain may be related to her vomiting and muscle strain but this does not explain her constant nausea.  The patient has not had any significant by mouth intake since being admitted.   Plan:  The patient will be set up for an EGD to rule out any peptic ulcer disease or gastric outlet obstruction.  This will be set up for tomorrow. I have discussed risks & benefits which include, but are not limited to, bleeding, infection, perforation & drug reaction.  The patient agrees with this plan & written consent will be obtained.     Thank you for involving me in the care of this patient.      LOS: 4  days   Cheryl Lame, MD  05/18/2018, 7:05 PM    Note: This dictation was prepared with Dragon dictation along with smaller phrase technology. Any transcriptional errors that result from this process are unintentional.

## 2018-05-18 NOTE — Plan of Care (Signed)
Patient and family reported that weds PM (6/19) was the last meal she's been able to eat.  Patient was able to swallow meds 1 at a time and x2 spoons of apple sauce with crushed potassium.  No coughing noted but witnessed strong gag reflex with applesauce bites.  Zofran IV given just prior to med administration, but swallowing difficulties still noted.  Dr. aware (GI consult and MRI ordered).

## 2018-05-18 NOTE — Consult Note (Signed)
   [facility] CM Inpatient Consult   05/18/2018  Sueanne MargaritaMary George Vonseggern 1951/03/19 [id number]  Referral received from [facility] regarding needs for post hospital follow up.  Call placed to the patient's room.  A gentle answered identifying himself as Jamse BelfastFrank Hanigan and states to call back tomorrow as the patient is currently asleep and resting.  [facility] with [facility]. [facility].   Will follow for disposition and needs at a more appropriate time.      Of note, [facility] services does not replace or interfere with any services that are arranged by inpatient case management or social work. For additional questions or referrals please contact:  Charlesetta ShanksVictoria Deann Mclaine, RN BSN CCM [facility] Liaison  [phone] business mobile phone Toll free office [phone]

## 2018-05-18 NOTE — Progress Notes (Signed)
PT Cancellation Note  Patient Details Name: Cheryl [name] MRN: [id number] DOB: [birth year]   Cancelled Treatment:    Reason Eval/Treat Not Completed: Patient declined, no reason specified.  PT consult received.  Chart reviewed.  Pt rolled to R side and brought LE's off of bed on own and then pt refused to let therapist assist her with her trunk to sit up (and pt kept saying that she couldn't sit up but also would not let therapist help her) and then pt brought LE's back into bed on own.  2 assist to boost pt up in bed.  Pt's husband reports pt has not eaten since Wednesday; also reports pt has been using bedside commode.  Pt's husband requesting no bed alarm on while he is in room; nursing notified and nurse discussed safety with pt's husband and that bed alarm needs to be on if he is not present.  Will re-attempt PT evaluation at a later date/time.  Hendricks LimesEmily Killian Ress, PT 05/18/18, 8:42 AM [phone]

## 2018-05-19 ENCOUNTER — Inpatient Hospital Stay: Payer: Medicare HMO | Admitting: Anesthesiology

## 2018-05-19 ENCOUNTER — Encounter: Payer: Self-pay | Admitting: Anesthesiology

## 2018-05-19 ENCOUNTER — Encounter
Admission: EM | Disposition: A | Discharge status: Home / Self Care | Payer: Self-pay | Source: Home / Self Care | Attending: Internal Medicine

## 2018-05-19 DIAGNOSIS — K226 Gastro-esophageal laceration-hemorrhage syndrome: Secondary | ICD-10-CM

## 2018-05-19 DIAGNOSIS — K2970 Gastritis, unspecified, without bleeding: Secondary | ICD-10-CM

## 2018-05-19 HISTORY — PX: ESOPHAGOGASTRODUODENOSCOPY (EGD) WITH PROPOFOL: SHX5813

## 2018-05-19 LAB — CULTURE, BLOOD (ROUTINE X 2)
CULTURE: NO GROWTH
Culture: NO GROWTH
Special Requests: ADEQUATE

## 2018-05-19 LAB — BLOOD GAS, ARTERIAL
ACID-BASE DEFICIT: 0.3 mmol/L (ref 0.0–2.0)
BICARBONATE: 25.4 mmol/L (ref 20.0–28.0)
FIO2: 0.21
Patient temperature: 37
pCO2 arterial: 46 mmHg (ref 32.0–48.0)
pH, Arterial: 7.35 (ref 7.350–7.450)

## 2018-05-19 LAB — CULTURE, RESPIRATORY W GRAM STAIN

## 2018-05-19 LAB — POTASSIUM: POTASSIUM: 3.5 mmol/L (ref 3.5–5.1)

## 2018-05-19 LAB — CULTURE, RESPIRATORY

## 2018-05-19 SURGERY — ESOPHAGOGASTRODUODENOSCOPY (EGD) WITH PROPOFOL
Anesthesia: General

## 2018-05-19 MED ORDER — PROPOFOL 10 MG/ML IV BOLUS
INTRAVENOUS | Status: DC | PRN
  Administered 2018-05-19: 50 mg via INTRAVENOUS
  Administered 2018-05-19: 10 mg via INTRAVENOUS

## 2018-05-19 MED ORDER — IPRATROPIUM-ALBUTEROL 0.5-2.5 (3) MG/3ML IN SOLN
RESPIRATORY_TRACT | Status: AC
  Administered 2018-05-19: 3 mL via RESPIRATORY_TRACT
  Filled 2018-05-19: qty 3

## 2018-05-19 MED ORDER — ESMOLOL HCL 100 MG/10ML IV SOLN
INTRAVENOUS | Status: DC | PRN
  Administered 2018-05-19: 20 mg via INTRAVENOUS

## 2018-05-19 MED ORDER — CIPROFLOXACIN HCL 500 MG PO TABS
500.0000 mg | ORAL_TABLET | Freq: Two times a day (BID) | ORAL | Status: DC
  Administered 2018-05-19 – 2018-05-21 (×4): 500 mg via ORAL
  Filled 2018-05-19 (×4): qty 1

## 2018-05-19 MED ORDER — CIPROFLOXACIN IN D5W 400 MG/200ML IV SOLN
400.0000 mg | Freq: Once | INTRAVENOUS | Status: AC
  Administered 2018-05-19: 16:00:00 400 mg via INTRAVENOUS
  Filled 2018-05-19: qty 200

## 2018-05-19 NOTE — Progress Notes (Signed)
Patient ID: Cheryl [name], female   DOB: [birth year], 67 y.o.   MRN: [id number]  [employer] PROGRESS NOTE  Cheryl [name] XBJ:[id number] DOB: [birth year] DOA: 05/14/2018 PCP: Malva Limes, MD  HPI/Subjective: Patient unable to eat much.  Still with some cough and a little shortness of breath.  Some epigastric abdominal pain.  Objective: Vitals:   05/19/18 1330 05/19/18 1401  BP: (!) 156/79 (!) 153/64  Pulse:  84  Resp:  16  Temp:  99 F (37.2 C)  SpO2:  92%    Filed Weights   05/14/18 1022  Weight: 40.8 kg (90 lb)    ROS: Review of Systems  Constitutional: Negative for chills and fever.  Eyes: Negative for blurred vision.  Respiratory: Positive for cough and sputum production. Negative for shortness of breath.   Cardiovascular: Negative for chest pain.  Gastrointestinal: Positive for abdominal pain and nausea. Negative for constipation, diarrhea and vomiting.  Genitourinary: Negative for dysuria.  Musculoskeletal: Negative for joint pain.  Neurological: Negative for dizziness and headaches.   Exam: Physical Exam  Constitutional: She is oriented to person, place, and time.  HENT:  Nose: No mucosal edema.  Mouth/Throat: No oropharyngeal exudate or posterior oropharyngeal edema.  Eyes: Pupils are equal, round, and reactive to light. Conjunctivae, EOM and lids are normal.  Neck: No JVD present. Carotid bruit is not present. No edema present. No thyroid mass and no thyromegaly present.  Cardiovascular: S1 normal and S2 normal. Exam reveals no gallop.  No murmur heard. Pulses:      Dorsalis pedis pulses are 2+ on the right side, and 2+ on the left side.  Respiratory: No respiratory distress. She has decreased breath sounds in the right lower field and the left lower field. She has no wheezes. She has rhonchi in the right lower field and the left lower field. She has no rales.  GI: Soft. Bowel sounds are normal. There is tenderness in the epigastric area.   Musculoskeletal:       Right ankle: She exhibits no swelling.       Left ankle: She exhibits no swelling.  Lymphadenopathy:    She has no cervical adenopathy.  Neurological: She is alert and oriented to person, place, and time. No cranial nerve deficit.  Skin: Skin is warm. No rash noted. Nails show no clubbing.  Psychiatric: She has a normal mood and affect.      Data Reviewed: Basic Metabolic Panel: Recent Labs  Lab 05/14/18 1033 05/15/18 0257 05/18/18 0316 05/19/18 1151  NA 138 140 136  --   K 3.6 3.7 3.0* 3.5  CL 103 109 102  --   CO2 26 24 20*  --   GLUCOSE 95 97 79  --   BUN 25* 15 19  --   CREATININE 1.12* 0.68 0.66  --   CALCIUM 8.5* 7.5* 8.1*  --    Liver Function Tests: Recent Labs  Lab 05/14/18 1033 05/18/18 0316  AST 21 12*  ALT 10* 9*  ALKPHOS 71 78  BILITOT 0.5 0.8  PROT 6.7 6.0*  ALBUMIN 3.4* 2.8*   CBC: Recent Labs  Lab 05/14/18 1033 05/15/18 0257 05/16/18 0334 05/18/18 0316  WBC 11.0 13.3* 9.2  --   NEUTROABS 9.8*  --   --   --   HGB 9.9* 7.5* 7.8* 9.1*  HCT 30.5* 24.1* 23.7*  --   MCV 80.7 82.3 81.6  --   PLT 170 146* 140*  --    Cardiac  Enzymes: Recent Labs  Lab 05/14/18 1033  TROPONINI 0.03*    CBG: Recent Labs  Lab 05/14/18 1016 05/17/18 0006  GLUCAP 95 286*    Recent Results (from the past 240 hour(s))  Blood Culture (routine x 2)     Status: None   Collection Time: 05/14/18 10:33 AM  Result Value Ref Range Status   Specimen Description BLOOD RIGHT ANTECUBITAL  Final   Special Requests   Final    BOTTLES DRAWN AEROBIC AND ANAEROBIC Blood Culture adequate volume   Culture   Final    NO GROWTH 5 DAYS Performed at [facility], [address] [place], [place] [address]    Report Status 05/19/2018 FINAL  Final  Blood Culture (routine x 2)     Status: None   Collection Time: 05/14/18 10:33 AM  Result Value Ref Range Status   Specimen Description BLOOD BLOOD LEFT WRIST  Final   Special Requests   Final     BOTTLES DRAWN AEROBIC AND ANAEROBIC Blood Culture results may not be optimal due to an inadequate volume of blood received in culture bottles   Culture   Final    NO GROWTH 5 DAYS Performed at [facility], [address] [place], [place] [address]    Report Status 05/19/2018 FINAL  Final  Urine culture     Status: None   Collection Time: 05/14/18 10:33 AM  Result Value Ref Range Status   Specimen Description   Final    URINE, RANDOM Performed at [facility], [address], [place], [place] [address]    Special Requests   Final    NONE Performed at [facility], [address], [place], [place] [address]    Culture   Final    NO GROWTH Performed at [facility], [address], [place], [place] [address]    Report Status 05/15/2018 FINAL  Final  Culture, expectorated sputum-assessment     Status: None   Collection Time: 05/16/18 11:46 AM  Result Value Ref Range Status   Specimen Description SPUTUM  Final   Special Requests NONE  Final   Sputum evaluation   Final    THIS SPECIMEN IS ACCEPTABLE FOR SPUTUM CULTURE Performed at [facility], [address], [place], [place] [address]    Report Status 05/16/2018 FINAL  Final  Culture, respiratory (NON-Expectorated)     Status: None   Collection Time: 05/16/18 11:46 AM  Result Value Ref Range Status   Specimen Description   Final    SPUTUM Performed at [facility], [address], [place], [place] [address]    Special Requests   Final    NONE Reflexed from [id number] Performed at [facility], [address] [place], [place] [address]    Gram Stain   Final    FEW WBC PRESENT,BOTH PMN AND MONONUCLEAR RARE SQUAMOUS EPITHELIAL CELLS PRESENT NO ORGANISMS SEEN Performed at [facility], [address] [place], [place] [address]    Culture   Final    RARE SERRATIA MARCESCENS FEW KLEBSIELLA PNEUMONIAE    Report Status 05/19/2018 FINAL  Final    Organism ID, Bacteria SERRATIA MARCESCENS  Final   Organism ID, Bacteria KLEBSIELLA PNEUMONIAE  Final      Susceptibility   Klebsiella pneumoniae - MIC*    AMPICILLIN RESISTANT Resistant     CEFAZOLIN <=4 SENSITIVE Sensitive     CEFEPIME <=1 SENSITIVE Sensitive     CEFTAZIDIME <=1  SENSITIVE Sensitive     CEFTRIAXONE <=1 SENSITIVE Sensitive     CIPROFLOXACIN <=0.25 SENSITIVE Sensitive     GENTAMICIN <=1 SENSITIVE Sensitive     IMIPENEM <=0.25 SENSITIVE Sensitive     TRIMETH/SULFA <=20 SENSITIVE Sensitive     AMPICILLIN/SULBACTAM 8 SENSITIVE Sensitive     PIP/TAZO 8 SENSITIVE Sensitive     Extended ESBL NEGATIVE Sensitive     * FEW KLEBSIELLA PNEUMONIAE   Serratia marcescens - MIC*    CEFAZOLIN >=64 RESISTANT Resistant     CEFEPIME <=1 SENSITIVE Sensitive     CEFTAZIDIME <=1 SENSITIVE Sensitive     CEFTRIAXONE <=1 SENSITIVE Sensitive     CIPROFLOXACIN <=0.25 SENSITIVE Sensitive     GENTAMICIN <=1 SENSITIVE Sensitive     TRIMETH/SULFA <=20 SENSITIVE Sensitive     * RARE SERRATIA MARCESCENS     Studies: Mr Brain 8 Contrast  Result Date: 05/18/2018 CLINICAL DATA:  Lethargy and confusion, which began recently. EXAM: MRI HEAD WITHOUT CONTRAST TECHNIQUE: Multiplanar, multiecho pulse sequences of the brain and surrounding structures were obtained without intravenous contrast. COMPARISON:  CT head 09/12/2017.  MR head 11/26/2012. FINDINGS: Brain: No evidence for acute infarction, hemorrhage, mass lesion, hydrocephalus, or extra-axial fluid. Normal cerebral volume. Moderately advanced T2 and FLAIR hyperintensities throughout the white matter, likely small vessel disease. Vascular: Flow voids are maintained throughout the carotid, basilar, and vertebral arteries. There are no areas of chronic hemorrhage. Skull and upper cervical spine: Unremarkable visualized calvarium, skullbase, and cervical vertebrae. Pituitary, pineal, cerebellar tonsils unremarkable. No upper cervical cord lesions.  Sinuses/Orbits: No orbital masses or proptosis. Globes appear symmetric. Sinuses appear well aerated, without evidence for air-fluid level. Other: None. IMPRESSION: Normal for age cerebral volume. Moderately advanced small vessel disease, similar to prior MR. No acute intracranial findings. Electronically Signed   By: Elsie Stain M.D.   On: 05/18/2018 14:52    Scheduled Meds: . [MAR Hold] amLODipine  5 mg Oral Daily  . [MAR Hold] ciprofloxacin  500 mg Oral BID  . [MAR Hold] cyanocobalamin  1,000 mcg Intramuscular Daily  . [MAR Hold] donepezil  10 mg Oral QHS  . [MAR Hold] DULoxetine  30 mg Oral Daily  . [MAR Hold] enoxaparin (LOVENOX) injection  30 mg Subcutaneous Q24H  . [MAR Hold] fentaNYL  50 mcg Transdermal Q72H  . [MAR Hold] nicotine  21 mg Transdermal Daily  . [MAR Hold] pantoprazole  40 mg Oral BID  . [MAR Hold] simvastatin  10 mg Oral QHS   Continuous Infusions: . sodium chloride 40 mL/hr at 05/19/18 1106  . [MAR Hold] ciprofloxacin    . [MAR Hold] famotidine (PEPCID) IV 20 mg (05/19/18 1057)    Assessment/Plan:  1. Epigastric abdominal pain, persistent nausea.  Endoscopy showing gastritis.  Restart full liquid diet.  Advance diet as able to do so.  Reevaluate tomorrow morning.  Continue Pepcid IV.   Already on Protonix.  PRN nausea medications.  MRI of the brain negative for stroke so this is not a neurological cause of her symptoms.  2. Acute encephalopathy secondary to pneumonia.  This has improved.  3. Pneumonia with slight temperature on presentation.  Change antibiotics with Cipro based on sputum culture results. 4. History of COPD.  Continue nebulizers. 5. Hyperlipidemia unspecified on simvastatin 6. Depression on Cymbalta 7. GERD on Protonix, pepcid 8. Chronic back pain on fentanyl patch 9. Chronic iron deficiency anemia.  Guaiac stool.  Patient had endoscopy and colonoscopy 2 years ago that was negative.  Can consider  a small bowel study as outpatient.  Ferritin  43. 10. Weakness.  Physical therapy evaluation. 11. B12 deficiency give IM B12  Code Status:     Code Status Orders  (From admission, onward)        Start     Ordered   05/14/18 1352  Limited resuscitation (code)  Continuous    Question Answer Comment  In the event of cardiac or respiratory ARREST: Initiate Code Blue, Call Rapid Response Yes   In the event of cardiac or respiratory ARREST: Perform CPR Yes   In the event of cardiac or respiratory ARREST: Perform Intubation/Mechanical Ventilation No   In the event of cardiac or respiratory ARREST: Use NIPPV/BiPAp only if indicated Yes   In the event of cardiac or respiratory ARREST: Administer ACLS medications if indicated Yes   In the event of cardiac or respiratory ARREST: Perform Defibrillation or Cardioversion if indicated Yes      05/14/18 1351    Code Status History    Date Active Date Inactive Code Status Order ID Comments User Context   02/08/2018 1835 02/09/2018 1920 Full Code [id number]  Altamese DillingVachhani, Vaibhavkumar, MD Inpatient   02/05/2018 2025 02/07/2018 2119 Full Code [id number]  Milagros LollSudini, Srikar, MD ED   04/29/2017 1314 05/02/2017 2148 Full Code [id number]  Katha HammingKonidena, Snehalatha, MD ED   12/23/2016 1745 12/25/2016 1849 Full Code [id number]  Katharina CaperVaickute, Rima, MD ED   11/06/2016 0900 11/07/2016 1853 Full Code [id number]  Delfino LovettShah, Vipul, MD Inpatient   11/05/2016 1401 11/06/2016 0900 Partial Code [id number]  Adrian SaranMody, Sital, MD Inpatient   06/03/2016 0253 06/05/2016 1945 Full Code [id number]  Gery Prayrosley, Debby, MD Inpatient   11/19/2015 1218 11/24/2015 1624 Full Code [id number]  Shaune Pollackhen, Qing, MD Inpatient   11/09/2015 2302 11/13/2015 1851 Full Code [id number]  Wyatt HasteHower, David K, MD ED   10/17/2015 2226 10/22/2015 1303 Full Code [id number]  Enedina FinnerPatel, Sona, MD Inpatient   10/09/2015 1738 10/12/2015 1934 Full Code [id number]  Shaune Pollackhen, Qing, MD Inpatient   08/01/2015 0008 08/03/2015 1454 Full Code [id number]  Oralia ManisWillis, David, MD Inpatient    Advance Directive Documentation      Most Recent Value  Type of Advance Directive  Living will  Pre-existing out of facility DNR order (yellow form or pink MOST form)  -  "MOST" Form in Place?  -     Family Communication: Husband  at the bedside Disposition Plan: Must tolerate diet prior to disposition  Antibiotics:  Cipro  Time spent: 26 minutes  Cheryl [name] [employer]  [employer]

## 2018-05-19 NOTE — Progress Notes (Signed)
PT Cancellation Note  Patient Details Name: Cheryl [name] MRN: [id number] DOB: 08/09/1951   Cancelled Treatment:    Reason Eval/Treat Not Completed: Patient declined, no reason specified.  PT consult received.  Chart reviewed.  Pt declining physical therapy reporting feeling too weak and tired to participate and still not eating.  Pt's husband present and reports pt now declining to use bedside commode and using bedpan (d/t feeling too weak to get up)--nurse notified; also pt's husband reports plan for EGD today.  Will re-attempt PT evaluation at a later date/time.  Hendricks LimesEmily Shenequa Howse, PT 05/19/18, 8:54 AM [phone]

## 2018-05-19 NOTE — Plan of Care (Signed)

## 2018-05-19 NOTE — Progress Notes (Signed)
Pt complains of nausea, abdominal pain that is not resolved with phenergan.  No IV pain medication ordered.  Pt states she is unable to take any pills by mouth. Call out to on call doctor. Henriette CombsSarah Klenner RN

## 2018-05-19 NOTE — Transfer of Care (Signed)
Immediate Anesthesia Transfer of Care Note  Patient: Cheryl [name]  Procedure(s) Performed: ESOPHAGOGASTRODUODENOSCOPY (EGD) WITH PROPOFOL (N/A )  Patient Location: PACU and Endoscopy Unit  Anesthesia Type:General  Level of Consciousness: awake, alert  and oriented  Airway & Oxygen Therapy: Patient Spontanous Breathing and Patient connected to nasal cannula oxygen  Post-op Assessment: Report given to RN and Post -op Vital signs reviewed and stable  Post vital signs: Reviewed and stable  Last Vitals:  Vitals Value Taken Time  BP 153/76 05/19/2018  1:20 PM  Temp 37.2 C 05/19/2018  1:18 PM  Pulse 81 05/19/2018  1:20 PM  Resp 20 05/19/2018  1:20 PM  SpO2 99 % 05/19/2018  1:20 PM  Vitals shown include unvalidated device data.  Last Pain:  Vitals:   05/19/18 1318  TempSrc: Tympanic  PainSc:       Patients Stated Pain Goal: 3 (05/19/18 1040)  Complications: No apparent anesthesia complications

## 2018-05-19 NOTE — Op Note (Signed)
[facility] Gastroenterology Patient Name: Cheryl [name] Procedure Date: 05/19/2018 11:21 AM MRN: [id number] Account #: [id number] Date of Birth: [birth year] Admit Type: Inpatient Age: [age] Room[facility] ENDO ROOM 4 Gender: Female Note Status: Finalized Procedure:            Upper GI endoscopy Indications:          Nausea with vomiting Providers:            Midge Miniumarren Monice Lundy MD, MD Referring MD:         Demetrios Isaacsonald E. Sherrie MustacheFisher, MD (Referring MD) Medicines:            Propofol per Anesthesia Complications:        No immediate complications. Procedure:            Pre-Anesthesia Assessment:                       - Prior to the procedure, a History and Physical was                        performed, and patient medications and allergies were                        reviewed. The patient's tolerance of previous                        anesthesia was also reviewed. The risks and benefits of                        the procedure and the sedation options and risks were                        discussed with the patient. All questions were                        answered, and informed consent was obtained. Prior                        Anticoagulants: The patient has taken no previous                        anticoagulant or antiplatelet agents. ASA Grade                        Assessment: II - A patient with mild systemic disease.                        After reviewing the risks and benefits, the patient was                        deemed in satisfactory condition to undergo the                        procedure.                       After obtaining informed consent, the endoscope was                        passed under direct vision. Throughout the procedure,  the patient's blood pressure, pulse, and oxygen                        saturations were monitored continuously. The Endoscope                        was introduced through the mouth, and advanced to the       second part of duodenum. The upper GI endoscopy was                        accomplished without difficulty. The patient tolerated                        the procedure well. Findings:      The examined esophagus was normal.      Localized moderate inflammation characterized by erythema was found in       the gastric antrum. Biopsies were taken with a cold forceps for       histology.      The examined duodenum was normal. Impression:           - Normal esophagus.                       - Gastritis. Biopsied.                       - Normal examined duodenum. Recommendation:       - Discharge patient to home.                       - Resume regular diet.                       - Continue present medications.                       - Await pathology results. Procedure Code(s):    --- Professional ---                       [id number], Esophagogastroduodenoscopy, flexible, transoral;                        with biopsy, single or multiple Diagnosis Code(s):    --- Professional ---                       R11.2, Nausea with vomiting, unspecified                       K29.70, Gastritis, unspecified, without bleeding CPT copyright 2017 American Medical Association. All rights reserved. The codes documented in this report are preliminary and upon coder review may  be revised to meet current compliance requirements. Midge Minium MD, MD 05/19/2018 1:15:23 PM This report has been signed electronically. Number of Addenda: 0 Note Initiated On: 05/19/2018 11:21 AM      [facility]

## 2018-05-19 NOTE — Anesthesia Postprocedure Evaluation (Signed)
Anesthesia Post Note  Patient: Cheryl [name]  Procedure(s) Performed: ESOPHAGOGASTRODUODENOSCOPY (EGD) WITH PROPOFOL (N/A )  Patient location during evaluation: Endoscopy Anesthesia Type: General Level of consciousness: awake and alert Pain management: pain level controlled Vital Signs Assessment: post-procedure vital signs reviewed and stable Respiratory status: spontaneous breathing, nonlabored ventilation, respiratory function stable and patient connected to nasal cannula oxygen Cardiovascular status: blood pressure returned to baseline and stable Postop Assessment: no apparent nausea or vomiting Anesthetic complications: no     Last Vitals:  Vitals:   05/19/18 1318 05/19/18 1319  BP:  (!) 153/76  Pulse:  82  Resp:  (!) 22  Temp: 37.2 C   SpO2:  93%    Last Pain:  Vitals:   05/19/18 1319  TempSrc:   PainSc: 0-No pain                 Cleda MccreedyJoseph K Joniya Boberg

## 2018-05-19 NOTE — Anesthesia Preprocedure Evaluation (Signed)
Anesthesia Evaluation  Patient identified by MRN, date of birth, ID band Patient awake    Reviewed: Allergy & Precautions, H&P , NPO status , Patient's Chart, lab work & pertinent test results  History of Anesthesia Complications Negative for: history of anesthetic complications  Airway Mallampati: III  TM Distance: <3 FB Neck ROM: limited    Dental  (+) Edentulous Upper, Edentulous Lower, Upper Dentures, Missing, Lower Dentures   Pulmonary neg shortness of breath, asthma , pneumonia, COPD, Current Smoker,           Cardiovascular Exercise Tolerance: Good hypertension, + CAD, + Peripheral Vascular Disease and +CHF  (-) Past MI      Neuro/Psych  Headaches, PSYCHIATRIC DISORDERS Anxiety  Neuromuscular disease    GI/Hepatic negative GI ROS, Neg liver ROS, GERD  Medicated and Controlled,  Endo/Other  negative endocrine ROS  Renal/GU negative Renal ROS  negative genitourinary   Musculoskeletal  (+) Arthritis , Fibromyalgia -  Abdominal   Peds  Hematology negative hematology ROS (+)   Anesthesia Other Findings Past Medical History: No date: Allergy No date: Anxiety No date: Arthritis 11/24/2015: Aspiration pneumonitis (HCC) No date: Asthma No date: Chronic pain No date: COPD (chronic obstructive pulmonary disease) (HCC) No date: Coronary artery disease No date: DVT (deep venous thrombosis) (HCC) No date: GERD (gastroesophageal reflux disease) No date: Headache No date: Hyperlipidemia No date: Hypertension No date: Kyphoscoliosis deformity of spine No date: Migraines 2010: Neuropathy No date: Osteoporosis No date: Oxygen deficiency No date: Peripheral vascular disease (HCC) No date: Pneumonia 11/19/2015: Pneumonia 10/2016: Pneumonia No date: Pneumonia     Comment:  aspiration 2018- 4 times in last year No date: Vitamin D deficiency  Past Surgical History: 1975: ABDOMINAL HYSTERECTOMY     Comment:   Bilaterl Oophorectomy; Dur to IUD infection 05/30/2008: abdomnal aortic stent     Comment:  Dr. Nanetta Batty No date: APPENDECTOMY No date: APPENDECTOMY 10/31/2009: cardiac catherization C5 - 6/C6-7: CERVICAL FUSION 1972: CHOLECYSTECTOMY 07/27/2015: COLONOSCOPY WITH PROPOFOL; N/A     Comment:  Procedure: COLONOSCOPY WITH PROPOFOL;  Surgeon: Wallace Cullens, MD;  Location: [facility] ENDOSCOPY;  Service:               Gastroenterology;  Laterality: N/A; 07/27/2015: ESOPHAGOGASTRODUODENOSCOPY (EGD) WITH PROPOFOL; N/A     Comment:  Procedure: ESOPHAGOGASTRODUODENOSCOPY (EGD) WITH               PROPOFOL;  Surgeon: Wallace Cullens, MD;  Location: [facility]               ENDOSCOPY;  Service: Gastroenterology;  Laterality: N/A; No date: FOOT SURGERY; Bilateral     Comment:  5-6 years per patient No date: SPINE SURGERY  BMI    Body Mass Index:  18.81 kg/m      Reproductive/Obstetrics negative OB ROS                             Anesthesia Physical Anesthesia Plan  ASA: III  Anesthesia Plan: General   Post-op Pain Management:    Induction: Intravenous  PONV Risk Score and Plan: Propofol infusion and TIVA  Airway Management Planned: Natural Airway and Nasal Cannula  Additional Equipment:   Intra-op Plan:   Post-operative Plan:   Informed Consent: I have reviewed the patients History and Physical, chart, labs and discussed the procedure including the risks, benefits and  alternatives for the proposed anesthesia with the patient or authorized representative who has indicated his/her understanding and acceptance.   Dental Advisory Given  Plan Discussed with: Anesthesiologist, CRNA and Surgeon  Anesthesia Plan Comments: (Patient consented for risks of anesthesia including but not limited to:  - adverse reactions to medications - risk of intubation if required - damage to teeth, lips or other oral mucosa - sore throat or hoarseness - Damage to heart, brain,  lungs or loss of life  Patient voiced understanding.)        Anesthesia Quick Evaluation

## 2018-05-19 NOTE — Anesthesia Post-op Follow-up Note (Signed)
Anesthesia QCDR form completed.        

## 2018-05-20 ENCOUNTER — Inpatient Hospital Stay: Payer: Medicare HMO

## 2018-05-20 LAB — COMPREHENSIVE METABOLIC PANEL
ALBUMIN: 2.3 g/dL — AB (ref 3.5–5.0)
ALK PHOS: 59 U/L (ref 38–126)
ALT: 6 U/L (ref 0–44)
ANION GAP: 10 (ref 5–15)
AST: 10 U/L — ABNORMAL LOW (ref 15–41)
BILIRUBIN TOTAL: 0.8 mg/dL (ref 0.3–1.2)
BUN: 11 mg/dL (ref 8–23)
CALCIUM: 7.7 mg/dL — AB (ref 8.9–10.3)
CO2: 23 mmol/L (ref 22–32)
Chloride: 105 mmol/L (ref 98–111)
Creatinine, Ser: 0.52 mg/dL (ref 0.44–1.00)
GFR calc non Af Amer: >60 mL/min (ref >60)
Glucose, Bld: 81 mg/dL (ref 70–99)
POTASSIUM: 2.8 mmol/L — AB (ref 3.5–5.1)
Sodium: 138 mmol/L (ref 135–145)
TOTAL PROTEIN: 4.9 g/dL — AB (ref 6.5–8.1)

## 2018-05-20 LAB — BASIC METABOLIC PANEL
ANION GAP: 8 (ref 5–15)
BUN: 11 mg/dL (ref 8–23)
CHLORIDE: 100 mmol/L (ref 98–111)
CO2: 25 mmol/L (ref 22–32)
CREATININE: 0.59 mg/dL (ref 0.44–1.00)
Calcium: 8.2 mg/dL — ABNORMAL LOW (ref 8.9–10.3)
GFR calc non Af Amer: >60 mL/min (ref >60)
Glucose, Bld: 141 mg/dL — ABNORMAL HIGH (ref 70–99)
Potassium: 4.3 mmol/L (ref 3.5–5.1)
SODIUM: 133 mmol/L — AB (ref 135–145)

## 2018-05-20 LAB — CBC
HEMATOCRIT: 23.9 % — AB (ref 35.0–47.0)
HEMOGLOBIN: 7.9 g/dL — AB (ref 12.0–16.0)
MCH: 26.8 pg (ref 26.0–34.0)
MCHC: 32.9 g/dL (ref 32.0–36.0)
MCV: 81.5 fL (ref 80.0–100.0)
Platelets: 237 10*3/uL (ref 150–440)
RBC: 2.94 MIL/uL — AB (ref 3.80–5.20)
RDW: 18.7 % — ABNORMAL HIGH (ref 11.5–14.5)
WBC: 5.3 10*3/uL (ref 3.6–11.0)

## 2018-05-20 LAB — SURGICAL PATHOLOGY

## 2018-05-20 LAB — MAGNESIUM: MAGNESIUM: 2 mg/dL (ref 1.7–2.4)

## 2018-05-20 MED ORDER — PREDNISONE 10 MG PO TABS
10.0000 mg | ORAL_TABLET | Freq: Every day | ORAL | Status: DC
  Administered 2018-05-20 – 2018-05-21 (×2): 10 mg via ORAL
  Filled 2018-05-20 (×2): qty 1

## 2018-05-20 MED ORDER — IPRATROPIUM-ALBUTEROL 0.5-2.5 (3) MG/3ML IN SOLN: 3.0000 mL | Freq: Once | RESPIRATORY_TRACT | Status: DC

## 2018-05-20 MED ORDER — TRAZODONE HCL 50 MG PO TABS: 50.0000 mg | ORAL_TABLET | Freq: Every evening | ORAL | Status: DC | PRN

## 2018-05-20 MED ORDER — PREGABALIN 75 MG PO CAPS
75.0000 mg | ORAL_CAPSULE | Freq: Two times a day (BID) | ORAL | Status: DC
  Administered 2018-05-20 – 2018-05-21 (×3): 75 mg via ORAL
  Filled 2018-05-20 (×3): qty 1

## 2018-05-20 MED ORDER — MOMETASONE FURO-FORMOTEROL FUM 200-5 MCG/ACT IN AERO
2.0000 | INHALATION_SPRAY | Freq: Two times a day (BID) | RESPIRATORY_TRACT | Status: DC
  Administered 2018-05-20 – 2018-05-21 (×3): 2 via RESPIRATORY_TRACT
  Filled 2018-05-20: qty 8.8

## 2018-05-20 MED ORDER — ESTRADIOL 1 MG PO TABS
0.5000 mg | ORAL_TABLET | Freq: Every day | ORAL | Status: DC
  Administered 2018-05-20 – 2018-05-21 (×2): 0.5 mg via ORAL
  Filled 2018-05-20 (×2): qty 0.5

## 2018-05-20 MED ORDER — POTASSIUM CHLORIDE 10 MEQ/100ML IV SOLN: 10.0000 meq | INTRAVENOUS | Status: DC

## 2018-05-20 MED ORDER — POTASSIUM CHLORIDE 20 MEQ PO PACK
40.0000 meq | PACK | Freq: Once | ORAL | Status: AC
Start: 2018-05-20 — End: 2018-05-20
  Administered 2018-05-20: 40 meq via ORAL
  Filled 2018-05-20: qty 2

## 2018-05-20 MED ORDER — IPRATROPIUM-ALBUTEROL 0.5-2.5 (3) MG/3ML IN SOLN
3.0000 mL | Freq: Once | RESPIRATORY_TRACT | Status: AC
  Administered 2018-05-20: 08:00:00 3 mL via RESPIRATORY_TRACT
  Filled 2018-05-20: qty 3

## 2018-05-20 MED ORDER — POTASSIUM CHLORIDE 10 MEQ/100ML IV SOLN
10.0000 meq | INTRAVENOUS | Status: AC
  Administered 2018-05-20 (×3): 10 meq via INTRAVENOUS
  Filled 2018-05-20 (×3): qty 100

## 2018-05-20 MED ORDER — COLCHICINE 0.6 MG PO TABS
0.6000 mg | ORAL_TABLET | Freq: Every day | ORAL | Status: DC
  Administered 2018-05-20 – 2018-05-21 (×2): 0.6 mg via ORAL
  Filled 2018-05-20 (×2): qty 1

## 2018-05-20 MED ORDER — POTASSIUM CHLORIDE CRYS ER 20 MEQ PO TBCR
40.0000 meq | EXTENDED_RELEASE_TABLET | Freq: Three times a day (TID) | ORAL | Status: DC
  Filled 2018-05-20 (×2): qty 2

## 2018-05-20 MED ORDER — TIOTROPIUM BROMIDE MONOHYDRATE 18 MCG IN CAPS
18.0000 ug | ORAL_CAPSULE | Freq: Every day | RESPIRATORY_TRACT | Status: DC
  Administered 2018-05-20 – 2018-05-21 (×2): 18 ug via RESPIRATORY_TRACT
  Filled 2018-05-20: qty 5

## 2018-05-20 NOTE — Evaluation (Signed)
Physical Therapy Evaluation Patient Details Name: Cheryl MargaritaMary George [name] MRN: [id number] DOB: 1951-06-26 Today's Date: 05/20/2018   History of Present Illness  Pt is a 67 y.o. female presenting to hospital 05/14/18 with fever, hypotension, hypoxia, and lethargy.  Pt admitted with aspiration PNA, COPD, AMS and hypoxia secondary to PNA.  Pt developed epigastric abdominal pain and N/V.  S/p EGD 04/29/18 which showed gastritis.  PMH includes COPD, DVT, htn, kyphoscoliosis, neuropathy, rib fx, fibromyalgia, dementia, c-fusion, h/o aspiration PNA, chronic back pain on fentanyl patch.  Clinical Impression  Prior to hospital admission, pt was ambulatory with 4ww.  Pt lives with her husband in 1 level home with ramp to enter.  Currently pt is min assist to scoot to edge of bed; min assist to stand with RW; and CGA to min assist to ambulate a few feet forward and backward with RW (limited distance d/t pt fatigue and generalized weakness).  Pt also c/o L wrist pain that started yesterday (although pt's husband reports pt has been favoring it for last week) and pt with limited L UE use during session d/t this pain (pt reporting 3/10 pain but appearing more painful based on pt's facial expressions and limited use during session); nursing notified regarding L wrist pain.  Pt would benefit from skilled PT to address noted impairments and functional limitations (see below for any additional details).  Upon hospital discharge, recommend pt discharge to [facility].    Follow Up Recommendations SNF    Equipment Recommendations  (youth sized RW)    Recommendations for Other Services       Precautions / Restrictions Precautions Precautions: Fall Restrictions Weight Bearing Restrictions: No      Mobility  Bed Mobility Overal bed mobility: Needs Assistance Bed Mobility: Supine to Sit;Sit to Supine     Supine to sit: Min assist;HOB elevated Sit to supine: Min assist;HOB elevated   General bed mobility comments:  increased effort for pt to perform; assist to scoot to edge of bed; assist for LE's sit to supine and 2 assist to boost pt up in bed  Transfers Overall transfer level: Needs assistance Equipment used: Rolling walker (2 wheeled) Transfers: Sit to/from Stand Sit to Stand: Min assist         General transfer comment: x2 trials from bed; vc's for LE and UE positioning required; assist to initiate stand  Ambulation/Gait Ambulation/Gait assistance: Min guard;Min assist Gait Distance (Feet): 6 Feet Assistive device: Rolling walker (2 wheeled)   Gait velocity: decreased   General Gait Details: mildly decreased stance time L LE compared to R LE; decreased B step length/foot clearance/heelstrike; limited distance d/t fatigue  Stairs            Wheelchair Mobility    Modified Rankin (Stroke Patients Only)       Balance Overall balance assessment: Needs assistance Sitting-balance support: Bilateral upper extremity supported;Feet supported Sitting balance-Leahy Scale: Poor Sitting balance - Comments: requires UE support for sitting balance   Standing balance support: Bilateral upper extremity supported Standing balance-Leahy Scale: Poor Standing balance comment: requires UE support for standing balance                             Pertinent Vitals/Pain Pain Assessment: 0-10 Pain Score: 3  Pain Location: chronic low back pain Pain Descriptors / Indicators: Aching Pain Intervention(s): Limited activity within patient's tolerance;Monitored during session;Repositioned  Vitals (HR and O2 on room air) stable and WFL throughout treatment session.  Home Living Family/patient expects to be discharged to:: Private residence Living Arrangements: Spouse/significant other Available Help at Discharge: Family;Available 24 hours/day Type of Home: House Home Access: Ramped entrance     Home Layout: One level Home Equipment: Walker - 2 wheels;Shower seat - built in;Grab  bars - tub/shower;Grab bars - toilet;Bedside commode;Wheelchair - IT trainer;Other (comment)      Prior Function Level of Independence: Needs assistance   Gait / Transfers Assistance Needed: Ambulating with rollator           Hand Dominance        Extremity/Trunk Assessment   Upper Extremity Assessment Upper Extremity Assessment: Generalized weakness(decreased L hand grip strength (pt reporting L wrist pain possibly from arthritis))    Lower Extremity Assessment Lower Extremity Assessment: Generalized weakness    Cervical / Trunk Assessment Cervical / Trunk Assessment: Kyphotic  Communication   Communication: No difficulties  Cognition Arousal/Alertness: Awake/alert Behavior During Therapy: WFL for tasks assessed/performed Overall Cognitive Status: Within Functional Limits for tasks assessed                                        General Comments   Nursing cleared pt for participation in physical therapy.  Pt agreeable to PT session.  Pt's husband present during session.    Exercises     Assessment/Plan    PT Assessment Patient needs continued PT services  PT Problem List Decreased strength;Decreased activity tolerance;Decreased balance;Decreased mobility;Pain       PT Treatment Interventions DME instruction;Gait training;Functional mobility training;Therapeutic activities;Therapeutic exercise;Balance training;Patient/family education    PT Goals (Current goals can be found in the Care Plan section)  Acute Rehab PT Goals Patient Stated Goal: to go home PT Goal Formulation: With patient/family Time For Goal Achievement: 06/03/18 Potential to Achieve Goals: Fair    Frequency Min 2X/week   Barriers to discharge Other (comment) Level of assist    Co-evaluation               AM-PAC PT "6 Clicks" Daily Activity  Outcome Measure Difficulty turning over in bed (including adjusting bedclothes, sheets and blankets)?: A  Little Difficulty moving from lying on back to sitting on the side of the bed? : A Lot Difficulty sitting down on and standing up from a chair with arms (e.g., wheelchair, bedside commode, etc,.)?: Unable Help needed moving to and from a bed to chair (including a wheelchair)?: A Little Help needed walking in hospital room?: A Lot Help needed climbing 3-5 steps with a railing? : A Lot 6 Click Score: 13    End of Session Equipment Utilized During Treatment: Gait belt Activity Tolerance: Patient limited by fatigue Patient left: in bed;with call bell/phone within reach;with bed alarm set;with nursing/sitter in room;with family/visitor present Nurse Communication: Mobility status;Precautions;Other (comment)(L wrist pain) PT Visit Diagnosis: Other abnormalities of gait and mobility (R26.89);Muscle weakness (generalized) (M62.81);Difficulty in walking, not elsewhere classified (R26.2)    Time: 1610-9604 PT Time Calculation (min) (ACUTE ONLY): 18 min   Charges:   PT Evaluation $PT Eval Low Complexity: 1 Low PT Treatments $Therapeutic Activity: 8-22 mins   PT G CodesHendricks Limes, PT 05/20/18, 4:25 PM [phone]

## 2018-05-20 NOTE — Consult Note (Signed)
   [facility] CM Inpatient Consult   05/20/2018  Cheryl [name] [birth year] [id number]   Referral received for [name] Management services and post hospital discharge follow up related to a diagnosis of COPD, Pneumonia and 3 hospitalizations in the last 6 months. Patient was evaluated for community based chronic disease management services with [facility] care Management Program as a benefit of patient's [facility] Medicare. Met with the patient at the bedside to explain [facility] Management services. Patient endorses her primary care provider to be Dr. Caryn Section whose office conducts their own transition of care calls. Patient states she does not have issues with transportation or medication affordability at this time. Verbal consent received for services. Patient gave her cell phone number [phone] as the best number to reach her. Patient will receive post hospital discharge follow up and be evaluated for monthly home visits. [facility] Care Management services do not interfere with or replace any services arranged by the inpatient care management team. RNCM left contact information and [facility] literature at the bedside. Made inpatient RNCM aware  [facility] will be following for care management. For additional questions please contact:   Malikiah Debarr RN, [facility] Liaison  [phone]) Business Mobile [phone]) Toll free office

## 2018-05-20 NOTE — Progress Notes (Signed)
Patient ID: Cheryl MargaritaMary George Finder, female   DOB: 1951/03/28, 67 y.o.   MRN: [id number]  [employer] PROGRESS NOTE  Cheryl [name] [id number] DOB: 1951/03/28 DOA: 05/14/2018 PCP: Malva LimesFisher, Donald E, MD  HPI/Subjective: Patient feeling a little bit better.  This morning ate a little sherbet and a couple bites of banana.  Did not want to do a CAT scan and wanted to see how things go with the diet.  Objective: Vitals:   05/20/18 0744 05/20/18 0923  BP:  (!) 145/68  Pulse:  87  Resp:  16  Temp:  97.8 F (36.6 C)  SpO2: 90% 91%    Filed Weights   05/14/18 1022  Weight: 40.8 kg (90 lb)    ROS: Review of Systems  Constitutional: Negative for chills and fever.  Eyes: Negative for blurred vision.  Respiratory: Positive for cough and sputum production. Negative for shortness of breath.   Cardiovascular: Negative for chest pain.  Gastrointestinal: Positive for abdominal pain and nausea. Negative for constipation, diarrhea and vomiting.  Genitourinary: Negative for dysuria.  Musculoskeletal: Negative for joint pain.  Neurological: Negative for dizziness and headaches.   Exam: Physical Exam  Constitutional: She is oriented to person, place, and time.  HENT:  Nose: No mucosal edema.  Mouth/Throat: No oropharyngeal exudate or posterior oropharyngeal edema.  Eyes: Pupils are equal, round, and reactive to light. Conjunctivae, EOM and lids are normal.  Neck: No JVD present. Carotid bruit is not present. No edema present. No thyroid mass and no thyromegaly present.  Cardiovascular: S1 normal and S2 normal. Exam reveals no gallop.  No murmur heard. Pulses:      Dorsalis pedis pulses are 2+ on the right side, and 2+ on the left side.  Respiratory: No respiratory distress. She has decreased breath sounds in the right lower field and the left lower field. She has no wheezes. She has rhonchi in the right lower field and the left lower field. She has no rales.  GI: Soft. Bowel sounds are  normal. There is tenderness in the epigastric area.  Musculoskeletal:       Right ankle: She exhibits no swelling.       Left ankle: She exhibits no swelling.  Lymphadenopathy:    She has no cervical adenopathy.  Neurological: She is alert and oriented to person, place, and time. No cranial nerve deficit.  Skin: Skin is warm. No rash noted. Nails show no clubbing.  Psychiatric: She has a normal mood and affect.      Data Reviewed: Basic Metabolic Panel: Recent Labs  Lab 05/14/18 1033 05/15/18 0257 05/18/18 0316 05/19/18 1151 05/20/18 0701  NA 138 140 136  --  138  K 3.6 3.7 3.0* 3.5 2.8*  CL 103 109 102  --  105  CO2 26 24 20*  --  23  GLUCOSE 95 97 79  --  81  BUN 25* 15 19  --  11  CREATININE 1.12* 0.68 0.66  --  0.52  CALCIUM 8.5* 7.5* 8.1*  --  7.7*  MG  --   --   --   --  2.0   Liver Function Tests: Recent Labs  Lab 05/14/18 1033 05/18/18 0316 05/20/18 0701  AST 21 12* 10*  ALT 10* 9* 6  ALKPHOS 71 78 59  BILITOT 0.5 0.8 0.8  PROT 6.7 6.0* 4.9*  ALBUMIN 3.4* 2.8* 2.3*   CBC: Recent Labs  Lab 05/14/18 1033 05/15/18 0257 05/16/18 0334 05/18/18 0316 05/20/18 0701  WBC  11.0 13.3* 9.2  --  5.3  NEUTROABS 9.8*  --   --   --   --   HGB 9.9* 7.5* 7.8* 9.1* 7.9*  HCT 30.5* 24.1* 23.7*  --  23.9*  MCV 80.7 82.3 81.6  --  81.5  PLT 170 146* 140*  --  237   Cardiac Enzymes: Recent Labs  Lab 05/14/18 1033  TROPONINI 0.03*    CBG: Recent Labs  Lab 05/14/18 1016 05/17/18 0006  GLUCAP 95 286*    Recent Results (from the past 240 hour(s))  Blood Culture (routine x 2)     Status: None   Collection Time: 05/14/18 10:33 AM  Result Value Ref Range Status   Specimen Description BLOOD RIGHT ANTECUBITAL  Final   Special Requests   Final    BOTTLES DRAWN AEROBIC AND ANAEROBIC Blood Culture adequate volume   Culture   Final    NO GROWTH 5 DAYS Performed at [facility], [address] [place], [place] [address]    Report Status 05/19/2018  FINAL  Final  Blood Culture (routine x 2)     Status: None   Collection Time: 05/14/18 10:33 AM  Result Value Ref Range Status   Specimen Description BLOOD BLOOD LEFT WRIST  Final   Special Requests   Final    BOTTLES DRAWN AEROBIC AND ANAEROBIC Blood Culture results may not be optimal due to an inadequate volume of blood received in culture bottles   Culture   Final    NO GROWTH 5 DAYS Performed at [facility], [address], [place], [place] [address]    Report Status 05/19/2018 FINAL  Final  Urine culture     Status: None   Collection Time: 05/14/18 10:33 AM  Result Value Ref Range Status   Specimen Description   Final    URINE, RANDOM Performed at [facility], [address] [place], [place] [address]    Special Requests   Final    NONE Performed at [facility], [address] [place], [place] [address]    Culture   Final    NO GROWTH Performed at [facility], [address], [place], [place] [address]    Report Status 05/15/2018 FINAL  Final  Culture, expectorated sputum-assessment     Status: None   Collection Time: 05/16/18 11:46 AM  Result Value Ref Range Status   Specimen Description SPUTUM  Final   Special Requests NONE  Final   Sputum evaluation   Final    THIS SPECIMEN IS ACCEPTABLE FOR SPUTUM CULTURE Performed at [facility], [address] [place], [place] [address]    Report Status 05/16/2018 FINAL  Final  Culture, respiratory (NON-Expectorated)     Status: None   Collection Time: 05/16/18 11:46 AM  Result Value Ref Range Status   Specimen Description   Final    SPUTUM Performed at [facility], [address] [place], [place] [address]    Special Requests   Final    NONE Reflexed from [id number] Performed at [facility], [address] [place], [place] [address]    Gram Stain   Final    FEW WBC PRESENT,BOTH PMN AND MONONUCLEAR RARE SQUAMOUS EPITHELIAL CELLS PRESENT NO  ORGANISMS SEEN Performed at [facility], [address] [place], [place] [address]    Culture   Final    RARE SERRATIA MARCESCENS FEW KLEBSIELLA PNEUMONIAE    Report Status 05/19/2018 FINAL  Final   Organism ID, Bacteria SERRATIA MARCESCENS  Final   Organism ID, Bacteria KLEBSIELLA PNEUMONIAE  Final      Susceptibility   Klebsiella pneumoniae - MIC*    AMPICILLIN RESISTANT Resistant     CEFAZOLIN <=4 SENSITIVE Sensitive     CEFEPIME <=1 SENSITIVE Sensitive     CEFTAZIDIME <=1 SENSITIVE Sensitive     CEFTRIAXONE <=1 SENSITIVE Sensitive     CIPROFLOXACIN <=0.25 SENSITIVE Sensitive     GENTAMICIN <=1 SENSITIVE Sensitive     IMIPENEM <=0.25 SENSITIVE Sensitive     TRIMETH/SULFA <=20 SENSITIVE Sensitive     AMPICILLIN/SULBACTAM 8 SENSITIVE Sensitive     PIP/TAZO 8 SENSITIVE Sensitive     Extended ESBL NEGATIVE Sensitive     * FEW KLEBSIELLA PNEUMONIAE   Serratia marcescens - MIC*    CEFAZOLIN >=64 RESISTANT Resistant     CEFEPIME <=1 SENSITIVE Sensitive     CEFTAZIDIME <=1 SENSITIVE Sensitive     CEFTRIAXONE <=1 SENSITIVE Sensitive     CIPROFLOXACIN <=0.25 SENSITIVE Sensitive     GENTAMICIN <=1 SENSITIVE Sensitive     TRIMETH/SULFA <=20 SENSITIVE Sensitive     * RARE SERRATIA MARCESCENS     Studies: Mr Brain 18 Contrast  Result Date: 05/18/2018 CLINICAL DATA:  Lethargy and confusion, which began recently. EXAM: MRI HEAD WITHOUT CONTRAST TECHNIQUE: Multiplanar, multiecho pulse sequences of the brain and surrounding structures were obtained without intravenous contrast. COMPARISON:  CT head 09/12/2017.  MR head 11/26/2012. FINDINGS: Brain: No evidence for acute infarction, hemorrhage, mass lesion, hydrocephalus, or extra-axial fluid. Normal cerebral volume. Moderately advanced T2 and FLAIR hyperintensities throughout the white matter, likely small vessel disease. Vascular: Flow voids are maintained throughout the carotid, basilar, and vertebral arteries. There are no areas of  chronic hemorrhage. Skull and upper cervical spine: Unremarkable visualized calvarium, skullbase, and cervical vertebrae. Pituitary, pineal, cerebellar tonsils unremarkable. No upper cervical cord lesions. Sinuses/Orbits: No orbital masses or proptosis. Globes appear symmetric. Sinuses appear well aerated, without evidence for air-fluid level. Other: None. IMPRESSION: Normal for age cerebral volume. Moderately advanced small vessel disease, similar to prior MR. No acute intracranial findings. Electronically Signed   By: Elsie Stain M.D.   On: 05/18/2018 14:52    Scheduled Meds: . amLODipine  5 mg Oral Daily  . ciprofloxacin  500 mg Oral BID  . cyanocobalamin  1,000 mcg Intramuscular Daily  . donepezil  10 mg Oral QHS  . DULoxetine  30 mg Oral Daily  . enoxaparin (LOVENOX) injection  30 mg Subcutaneous Q24H  . estradiol  0.5 mg Oral Daily  . fentaNYL  50 mcg Transdermal Q72H  . ipratropium-albuterol  3 mL Nebulization Once  . mometasone-formoterol  2 puff Inhalation BID  . nicotine  21 mg Transdermal Daily  . pantoprazole  40 mg Oral BID  . potassium chloride  40 mEq Oral TID  . pregabalin  75 mg Oral BID  . simvastatin  10 mg Oral QHS  . tiotropium  18 mcg Inhalation Daily   Continuous Infusions: . famotidine (PEPCID) IV Stopped (05/19/18 1127)    Assessment/Plan:  1. Epigastric abdominal pain, persistent nausea.  Endoscopy showing gastritis.  Advance diet today.  Continue Pepcid IV.   Already on Protonix.  PRN nausea medications.   2. Hypokalemia.  IV potassium runs and oral K-Lor.  Recheck tomorrow morning potassium level and this evening 3. Acute encephalopathy secondary to pneumonia.  This has improved.  4. Pneumonia with slight temperature on presentation.  Change antibiotics with  Cipro based on sputum culture results. 5. History of COPD.  Continue nebulizers. 6. Hyperlipidemia unspecified on simvastatin 7. Depression on Cymbalta 8. GERD on Protonix, pepcid 9. Chronic back  pain on fentanyl patch, restart lower dose Lyrica. 10. Chronic iron deficiency anemia.  Guaiac stool.  Patient had endoscopy and colonoscopy 2 years ago that was negative.  Can consider a small bowel study as outpatient.  Ferritin 43. 11. Weakness.  Physical therapy evaluation. 12. B12 deficiency give IM B12  Code Status:     Code Status Orders  (From admission, onward)        Start     Ordered   05/14/18 1352  Limited resuscitation (code)  Continuous    Question Answer Comment  In the event of cardiac or respiratory ARREST: Initiate Code Blue, Call Rapid Response Yes   In the event of cardiac or respiratory ARREST: Perform CPR Yes   In the event of cardiac or respiratory ARREST: Perform Intubation/Mechanical Ventilation No   In the event of cardiac or respiratory ARREST: Use NIPPV/BiPAp only if indicated Yes   In the event of cardiac or respiratory ARREST: Administer ACLS medications if indicated Yes   In the event of cardiac or respiratory ARREST: Perform Defibrillation or Cardioversion if indicated Yes      05/14/18 1351    Code Status History    Date Active Date Inactive Code Status Order ID Comments User Context   02/08/2018 1835 02/09/2018 1920 Full Code [id number]  Altamese Dilling, MD Inpatient   02/05/2018 2025 02/07/2018 2119 Full Code [id number]  Milagros Loll, MD ED   04/29/2017 1314 05/02/2017 2148 Full Code [id number]  Katha Hamming, MD ED   12/23/2016 1745 12/25/2016 1849 Full Code [id number]  Katharina Caper, MD ED   11/06/2016 0900 11/07/2016 1853 Full Code [id number]  Delfino Lovett, MD Inpatient   11/05/2016 1401 11/06/2016 0900 Partial Code [id number]  Adrian Saran, MD Inpatient   06/03/2016 0253 06/05/2016 1945 Full Code [id number]  Gery Pray, MD Inpatient   11/19/2015 1218 11/24/2015 1624 Full Code [id number]  Shaune Pollack, MD Inpatient   11/09/2015 2302 11/13/2015 1851 Full Code [id number]  Wyatt Haste, MD ED   10/17/2015 2226 10/22/2015 1303 Full Code [id number]   Enedina Finner, MD Inpatient   10/09/2015 1738 10/12/2015 1934 Full Code [id number]  Shaune Pollack, MD Inpatient   08/01/2015 0008 08/03/2015 1454 Full Code [id number]  Oralia Manis, MD Inpatient    Advance Directive Documentation     Most Recent Value  Type of Advance Directive  Living will  Pre-existing out of facility DNR order (yellow form or pink MOST form)  -  "MOST" Form in Place?  -     Family Communication: Husband  at the bedside second and third time I went back to see them. Disposition Plan: Must tolerate diet prior to disposition.  Recheck potassium  Antibiotics:  Cipro  Time spent: 28 minutes  Arlen Legendre [employer]

## 2018-05-21 ENCOUNTER — Encounter: Payer: Self-pay | Admitting: Gastroenterology

## 2018-05-21 LAB — BASIC METABOLIC PANEL
ANION GAP: 8 (ref 5–15)
BUN: 11 mg/dL (ref 8–23)
CHLORIDE: 98 mmol/L (ref 98–111)
CO2: 25 mmol/L (ref 22–32)
Calcium: 8.4 mg/dL — ABNORMAL LOW (ref 8.9–10.3)
Creatinine, Ser: 0.41 mg/dL — ABNORMAL LOW (ref 0.44–1.00)
GFR calc non Af Amer: >60 mL/min (ref >60)
Glucose, Bld: 125 mg/dL — ABNORMAL HIGH (ref 70–99)
POTASSIUM: 3.8 mmol/L (ref 3.5–5.1)
SODIUM: 131 mmol/L — AB (ref 135–145)

## 2018-05-21 LAB — URIC ACID: URIC ACID, SERUM: 2.7 mg/dL (ref 2.5–7.1)

## 2018-05-21 LAB — HEMOGLOBIN: Hemoglobin: 8.6 g/dL — ABNORMAL LOW (ref 12.0–16.0)

## 2018-05-21 MED ORDER — FAMOTIDINE 20 MG PO TABS: 20.0000 mg | ORAL_TABLET | Freq: Every day | ORAL | 0 refills | Status: DC

## 2018-05-21 MED ORDER — ONDANSETRON HCL 4 MG PO TABS: 4.0000 mg | ORAL_TABLET | Freq: Four times a day (QID) | ORAL | 0 refills | Status: DC | PRN

## 2018-05-21 MED ORDER — VITAMIN B-12 1000 MCG PO TABS: 1000.0000 ug | ORAL_TABLET | Freq: Every day | ORAL | 0 refills | Status: AC

## 2018-05-21 MED ORDER — AMLODIPINE BESYLATE 5 MG PO TABS: 5.0000 mg | ORAL_TABLET | Freq: Every day | ORAL | 0 refills | Status: DC

## 2018-05-21 MED ORDER — PREDNISONE 10 MG PO TABS: 10.0000 mg | ORAL_TABLET | Freq: Every day | ORAL | 0 refills | Status: AC

## 2018-05-21 MED ORDER — NICOTINE 21 MG/24HR TD PT24: 21.0000 mg | MEDICATED_PATCH | Freq: Every day | TRANSDERMAL | 0 refills | Status: DC

## 2018-05-21 MED ORDER — ACETAMINOPHEN 325 MG PO TABS: 650.0000 mg | ORAL_TABLET | Freq: Four times a day (QID) | ORAL | Status: AC | PRN

## 2018-05-21 MED ORDER — CIPROFLOXACIN HCL 500 MG PO TABS: 500.0000 mg | ORAL_TABLET | Freq: Two times a day (BID) | ORAL | 0 refills | Status: DC

## 2018-05-21 MED ORDER — PREGABALIN 75 MG PO CAPS
75.0000 mg | ORAL_CAPSULE | Freq: Two times a day (BID) | ORAL | 0 refills | Status: DC
Start: 2018-05-21 — End: 2018-06-05

## 2018-05-21 NOTE — Plan of Care (Signed)
  Problem: Education: Goal: Knowledge of General Education information will improve Outcome: Progressing   Problem: Health Behavior/Discharge Planning: Goal: Ability to manage health-related needs will improve Outcome: Progressing   Problem: Nutrition: Goal: Adequate nutrition will be maintained Outcome: Progressing   Problem: Coping: Goal: Level of anxiety will decrease Outcome: Progressing   Problem: Elimination: Goal: Will not experience complications related to bowel motility Outcome: Progressing Goal: Will not experience complications related to urinary retention Outcome: Progressing   Problem: Pain Managment: Goal: General experience of comfort will improve Outcome: Progressing   Problem: Safety: Goal: Ability to remain free from injury will improve Outcome: Progressing   Problem: Skin Integrity: Goal: Risk for impaired skin integrity will decrease Outcome: Progressing

## 2018-05-21 NOTE — Discharge Summary (Signed)
[facility] NAME: Cheryl [name]    MR#:  [id number]  DATE OF BIRTH:  07-24-1951  DATE OF ADMISSION:  05/14/2018 ADMITTING PHYSICIAN: Saundra Shelling, MD  DATE OF DISCHARGE: 05/21/2018 12:56 PM  PRIMARY CARE PHYSICIAN: Birdie Sons, MD    ADMISSION DIAGNOSIS:  Hypoxia [R09.02] Altered mental status, unspecified altered mental status type [R41.82]  DISCHARGE DIAGNOSIS:  Active Problems:   Hypoxia   Gastritis without bleeding   SECONDARY DIAGNOSIS:   Past Medical History:  Diagnosis Date  . Allergy   . Anxiety   . Arthritis   . Aspiration pneumonitis ([place]) 11/24/2015  . Asthma   . Chronic pain   . COPD (chronic obstructive pulmonary disease) ([place])   . Coronary artery disease   . DVT (deep venous thrombosis) ([place])   . GERD (gastroesophageal reflux disease)   . Headache   . Hyperlipidemia   . Hypertension   . Kyphoscoliosis deformity of spine   . Migraines   . Neuropathy 2010  . Osteoporosis   . Oxygen deficiency   . Peripheral vascular disease ([place])   . Pneumonia   . Pneumonia 11/19/2015  . Pneumonia 10/2016  . Pneumonia    aspiration 2018- 4 times in last year  . Vitamin D deficiency     HOSPITAL COURSE:   1.  Pneumonia.  Patient was started on Unasyn for the potential of aspiration pneumonia but sputum culture grew out Serratia and Klebsiella.  Antibiotics were switched over to Cipro for completion of course upon discharge home. 2.  Epigastric abdominal pain with persistent nausea.  Patient had an endoscopy which showed gastritis.  Patient was already on Protonix and was started on Pepcid IV.  Patient at the end of the hospital course was able to tolerate solid food and be discharged home. 3.  Hypokalemia the patient was given numerous rounds of potassium and oral potassium.  Since the patient is eating better this should not be an issue upon going home. 4.  Acute encephalopathy on presentation secondary to  pneumonia. 5.  History of COPD.  We will give her nebulizers here in the hospital can go back on her inhalers as outpatient 6.  Chronic pain on fentanyl patch.  Restarted lower dose Lyrica. 7.  Chronic iron deficiency anemia.  Endoscopy showed gastritis.  Patient on PPI and Pepcid.  Can consider capsule endoscopy in the future if still anemic. 8.  B12 deficiency I gave IM B12 injections while here oral B12 upon going home. 9.  Acute left wrist pain.  X-ray negative for fracture.  Did show arthritis.  I did give a dose of prednisone.  We will give a quick prednisone taper upon going home.  Left wrist brace ordered.  Can consider repeat x-ray in 10 days if still having pain at that time. 10.  Depression on Cymbalta 11.  Hyperlipidemia unspecified on simvastatin 12.  Weakness.  Physical therapy recommends rehab.  Patient does not want to go to rehab.  I set up home with home health. 13.  Hypertension.  Low-dose Norvasc prescribed.  DISCHARGE CONDITIONS:   Satisfactory  CONSULTS OBTAINED:  Treatment Team:  Lucilla Lame, MD  DRUG ALLERGIES:   Allergies  Allergen Reactions  . Percocet [Oxycodone-Acetaminophen] Hives and Rash  . Aspirin Nausea And Vomiting and Other (See Comments)    Reaction:  GI upset   . Codeine Nausea And Vomiting, Other (See Comments) and Nausea Only    Reaction:  GI  upset   . Propoxyphene Other (See Comments) and Nausea Only    GI upset Reaction:  GI upset   . Sulfa Antibiotics Rash and Other (See Comments)    Reaction:  GI upset     DISCHARGE MEDICATIONS:   Allergies as of 05/21/2018      Reactions   Percocet [oxycodone-acetaminophen] Hives, Rash   Aspirin Nausea And Vomiting, Other (See Comments)   Reaction:  GI upset    Codeine Nausea And Vomiting, Other (See Comments), Nausea Only   Reaction:  GI upset    Propoxyphene Other (See Comments), Nausea Only   GI upset Reaction:  GI upset    Sulfa Antibiotics Rash, Other (See Comments)   Reaction:  GI upset        Medication List    STOP taking these medications   celecoxib 100 MG capsule Commonly known as:  CELEBREX   diphenoxylate-atropine 2.5-0.025 MG tablet Commonly known as:  LOMOTIL   doxycycline 100 MG tablet Commonly known as:  VIBRA-TABS   furosemide 40 MG tablet Commonly known as:  LASIX   mupirocin ointment 2 % Commonly known as:  BACTROBAN   silver sulfADIAZINE 1 % cream Commonly known as:  SILVADENE     TAKE these medications   acetaminophen 325 MG tablet Commonly known as:  TYLENOL Take 2 tablets (650 mg total) by mouth every 6 (six) hours as needed for mild pain (or Fever >/= 101). What changed:  Another medication with the same name was added. Make sure you understand how and when to take each.   acetaminophen 325 MG tablet Commonly known as:  TYLENOL Take 2 tablets (650 mg total) by mouth every 6 (six) hours as needed for mild pain (or Fever >/= 101). What changed:  You were already taking a medication with the same name, and this prescription was added. Make sure you understand how and when to take each.   albuterol (2.5 MG/3ML) 0.083% nebulizer solution Commonly known as:  PROVENTIL Take 3 mLs (2.5 mg total) by nebulization every 4 (four) hours as needed for wheezing. What changed:  Another medication with the same name was removed. Continue taking this medication, and follow the directions you see here.   alendronate 70 MG tablet Commonly known as:  FOSAMAX Take 1 tablet (70 mg total) by mouth every 7 (seven) days. Take with a full glass of water on an empty stomach.   ALPRAZolam 0.5 MG tablet Commonly known as:  XANAX take 1 tablet by mouth three times a day if needed   amLODipine 5 MG tablet Commonly known as:  NORVASC Take 1 tablet (5 mg total) by mouth daily. Start taking on:  05/22/2018   ciprofloxacin 500 MG tablet Commonly known as:  CIPRO Take 1 tablet (500 mg total) by mouth 2 (two) times daily.   clopidogrel 75 MG tablet Commonly  known as:  PLAVIX Take 75 mg by mouth daily.   cyclobenzaprine 5 MG tablet Commonly known as:  FLEXERIL Take 1 tablet (5 mg total) by mouth 3 (three) times daily as needed for muscle spasms.   donepezil 10 MG tablet Commonly known as:  ARICEPT Take 10 mg by mouth at bedtime.   DULoxetine 30 MG capsule Commonly known as:  CYMBALTA Take 1 capsule by mouth daily.   estradiol 0.5 MG tablet Commonly known as:  ESTRACE Take 1 tablet (0.5 mg total) by mouth daily. (Take in place of estropipate)   famotidine 20 MG tablet Commonly known as:  PEPCID Take 1 tablet (20 mg total) by mouth daily.   fentaNYL 50 MCG/HR Commonly known as:  DURAGESIC - dosed mcg/hr Place 1 patch (50 mcg total) onto the skin every 3 (three) days. Start taking on:  07/21/2018 What changed:  Another medication with the same name was removed. Continue taking this medication, and follow the directions you see here.   Fluticasone-Salmeterol 250-50 MCG/DOSE Aepb Commonly known as:  ADVAIR DISKUS inhale 1 dose by mouth twice a day   NARCAN 4 MG/0.1ML Liqd nasal spray kit Generic drug:  naloxone Place 0.4 mg into the nose once.   nicotine 21 mg/24hr patch Commonly known as:  NICODERM CQ - dosed in mg/24 hours Place 1 patch (21 mg total) onto the skin daily. Okay to substitute generic patch Start taking on:  05/22/2018   ondansetron 4 MG tablet Commonly known as:  ZOFRAN Take 1 tablet (4 mg total) by mouth every 6 (six) hours as needed for nausea.   pantoprazole 40 MG tablet Commonly known as:  PROTONIX Take 1 tablet (40 mg total) by mouth 2 (two) times daily.   potassium chloride 10 MEQ tablet Commonly known as:  K-DUR Take 1 tablet by mouth daily.   predniSONE 10 MG tablet Commonly known as:  DELTASONE Take 1 tablet (10 mg total) by mouth daily with breakfast for 5 days. Start taking on:  05/22/2018   pregabalin 75 MG capsule Commonly known as:  LYRICA Take 1 capsule (75 mg total) by mouth 2 (two)  times daily. What changed:    medication strength  how much to take   simvastatin 10 MG tablet Commonly known as:  ZOCOR Take 10 mg by mouth at bedtime.   tiotropium 18 MCG inhalation capsule Commonly known as:  SPIRIVA HANDIHALER inhale the contents of one capsule in the handihaler once daily   traZODone 50 MG tablet Commonly known as:  DESYREL Take 1-2 tablets (50-100 mg total) by mouth at bedtime.   triamcinolone ointment 0.5 % Commonly known as:  KENALOG Apply to lesions on hands twice a day as needed   vitamin B-12 1000 MCG tablet Commonly known as:  CYANOCOBALAMIN Take 1 tablet (1,000 mcg total) by mouth daily.            Durable Medical Equipment  (From admission, onward)        Start     Ordered   05/21/18 1156  For home use only DME Walker youth  Once    Question:  Patient needs a walker to treat with the following condition  Answer:  Unsteady gait   05/21/18 1155       DISCHARGE INSTRUCTIONS:    Follow-up PMD 6 days   If you experience worsening of your admission symptoms, develop shortness of breath, life threatening emergency, suicidal or homicidal thoughts you must seek medical attention immediately by calling 911 or calling your MD immediately  if symptoms less severe.  You Must read complete instructions/literature along with all the possible adverse reactions/side effects for all the Medicines you take and that have been prescribed to you. Take any new Medicines after you have completely understood and accept all the possible adverse reactions/side effects.   Please note  You were cared for by a hospitalist during your hospital stay. If you have any questions about your discharge medications or the care you received while you were in the hospital after you are discharged, you can call the unit and asked to speak with the hospitalist on  call if the hospitalist that took care of you is not available. Once you are discharged, your primary care  physician will handle any further medical issues. Please note that NO REFILLS for any discharge medications will be authorized once you are discharged, as it is imperative that you return to your primary care physician (or establish a relationship with a primary care physician if you do not have one) for your aftercare needs so that they can reassess your need for medications and monitor your lab values.    Today   CHIEF COMPLAINT:   Chief Complaint  Patient presents with  . Fever  . Hypotension    HISTORY OF PRESENT ILLNESS:  Juanitta Earnhardt  is a 67 y.o. female presented with fever hypotension and altered mental status   VITAL SIGNS:  Blood pressure (!) 143/66, pulse (!) 59, temperature 98 F (36.7 C), temperature source Oral, resp. rate 16, height 4' 10" (1.473 m), weight 40.8 kg (90 lb), SpO2 100 %.   PHYSICAL EXAMINATION:  GENERAL:  67 y.o.-year-old patient lying in the bed with no acute distress.  EYES: Pupils equal, round, reactive to light and accommodation. No scleral icterus. Extraocular muscles intact.  HEENT: Head atraumatic, normocephalic. Oropharynx and nasopharynx clear.  NECK:  Supple, no jugular venous distention. No thyroid enlargement, no tenderness.  LUNGS: Normal breath sounds bilaterally, no wheezing, rales,rhonchi or crepitation. No use of accessory muscles of respiration.  CARDIOVASCULAR: S1, S2 normal. No murmurs, rubs, or gallops.  ABDOMEN: Soft, non-tender, non-distended. Bowel sounds present. No organomegaly or mass.  EXTREMITIES: No pedal edema, cyanosis, or clubbing.  Left wrist pain to palpation. NEUROLOGIC: Cranial nerves II through XII are intact. Muscle strength 5/5 in all extremities. Sensation intact. Gait not checked.  PSYCHIATRIC: The patient is alert and oriented x 3.  SKIN: No obvious rash, lesion, or ulcer.   DATA REVIEW:   CBC Recent Labs  Lab 05/20/18 0701 05/21/18 0910  WBC 5.3  --   HGB 7.9* 8.6*  HCT 23.9*  --   PLT 237  --      Chemistries  Recent Labs  Lab 05/20/18 0701  05/21/18 0910  NA 138   < > 131*  K 2.8*   < > 3.8  CL 105   < > 98  CO2 23   < > 25  GLUCOSE 81   < > 125*  BUN 11   < > 11  CREATININE 0.52   < > 0.41*  CALCIUM 7.7*   < > 8.4*  MG 2.0  --   --   AST 10*  --   --   ALT 6  --   --   ALKPHOS 59  --   --   BILITOT 0.8  --   --    < > = values in this interval not displayed.    Cardiac Enzymes No results for input(s): TROPONINI in the last 168 hours.  Microbiology Results  Results for orders placed or performed during the hospital encounter of 05/14/18  Blood Culture (routine x 2)     Status: None   Collection Time: 05/14/18 10:33 AM  Result Value Ref Range Status   Specimen Description BLOOD RIGHT ANTECUBITAL  Final   Special Requests   Final    BOTTLES DRAWN AEROBIC AND ANAEROBIC Blood Culture adequate volume   Culture   Final    NO GROWTH 5 DAYS Performed at [facility], [address], [place], [place] [address]  Report Status 05/19/2018 FINAL  Final  Blood Culture (routine x 2)     Status: None   Collection Time: 05/14/18 10:33 AM  Result Value Ref Range Status   Specimen Description BLOOD BLOOD LEFT WRIST  Final   Special Requests   Final    BOTTLES DRAWN AEROBIC AND ANAEROBIC Blood Culture results may not be optimal due to an inadequate volume of blood received in culture bottles   Culture   Final    NO GROWTH 5 DAYS Performed at [facility], [address], [place], [place] [address]    Report Status 05/19/2018 FINAL  Final  Urine culture     Status: None   Collection Time: 05/14/18 10:33 AM  Result Value Ref Range Status   Specimen Description   Final    URINE, RANDOM Performed at [facility], [address], [place], [place] [address]    Special Requests   Final    NONE Performed at [facility], [address], [place], [place] [address]    Culture   Final    NO GROWTH Performed at [place], [place] [address] [place], [place] [address]    Report Status 05/15/2018 FINAL  Final  Culture, expectorated sputum-assessment     Status: None   Collection Time: 05/16/18 11:46 AM  Result Value Ref Range Status   Specimen Description SPUTUM  Final   Special Requests NONE  Final   Sputum evaluation   Final    THIS SPECIMEN IS ACCEPTABLE FOR SPUTUM CULTURE Performed at [facility], [address], [place], [place] [address]    Report Status 05/16/2018 FINAL  Final  Culture, respiratory (NON-Expectorated)     Status: None   Collection Time: 05/16/18 11:46 AM  Result Value Ref Range Status   Specimen Description   Final    SPUTUM Performed at [facility], [address], [place], [place] [address]    Special Requests   Final    NONE Reflexed from [id number] Performed at [facility], [address] [place], [place] [address]    Gram Stain   Final    FEW WBC PRESENT,BOTH PMN AND MONONUCLEAR RARE SQUAMOUS EPITHELIAL CELLS PRESENT NO ORGANISMS SEEN Performed at [facility], [place] [address], [place], [place] [address]    Culture   Final    RARE SERRATIA MARCESCENS FEW KLEBSIELLA PNEUMONIAE    Report Status 05/19/2018 FINAL  Final   Organism ID, Bacteria SERRATIA MARCESCENS  Final   Organism ID, Bacteria KLEBSIELLA PNEUMONIAE  Final      Susceptibility   Klebsiella pneumoniae - MIC*    AMPICILLIN RESISTANT Resistant     CEFAZOLIN <=4 SENSITIVE Sensitive     CEFEPIME <=1 SENSITIVE Sensitive     CEFTAZIDIME <=1 SENSITIVE Sensitive     CEFTRIAXONE <=1 SENSITIVE Sensitive     CIPROFLOXACIN <=0.25 SENSITIVE Sensitive     GENTAMICIN <=1 SENSITIVE Sensitive     IMIPENEM <=0.25 SENSITIVE Sensitive     TRIMETH/SULFA <=20 SENSITIVE Sensitive     AMPICILLIN/SULBACTAM 8 SENSITIVE Sensitive     PIP/TAZO 8 SENSITIVE Sensitive     Extended ESBL NEGATIVE Sensitive     * FEW KLEBSIELLA PNEUMONIAE   Serratia marcescens - MIC*    CEFAZOLIN >=64 RESISTANT  Resistant     CEFEPIME <=1 SENSITIVE Sensitive     CEFTAZIDIME <=1 SENSITIVE Sensitive     CEFTRIAXONE <=1 SENSITIVE Sensitive     CIPROFLOXACIN <=0.25  SENSITIVE Sensitive     GENTAMICIN <=1 SENSITIVE Sensitive     TRIMETH/SULFA <=20 SENSITIVE Sensitive     * RARE SERRATIA MARCESCENS    RADIOLOGY:  Dg Wrist Complete Left  Result Date: 05/20/2018 CLINICAL DATA:  Left wrist pain EXAM: LEFT WRIST - COMPLETE 3+ VIEW COMPARISON:  04/09/2016 FINDINGS: Osteoarthritis at the base of the thumb metacarpal and triscaphe joints are redemonstrated with progressive degenerative cystic change noted at the base of the thumb metacarpal. Osteoarthritic joint space narrowing is also identified with spurring about the first MCP joint. Carpal rows are maintained. No acute fracture or suspicious osseous lesions. Mild generalized soft tissue swelling is suggested. IMPRESSION: Osteoarthritis of the first MCP, CMC and triscaphe joints, slightly progressed since prior. No acute osseous abnormality. Electronically Signed   By: Ashley Royalty M.D.   On: 05/20/2018 18:38      Management plans discussed with the patient, family and they are in agreement.  CODE STATUS:     Code Status Orders  (From admission, onward)        Start     Ordered   05/14/18 1352  Limited resuscitation (code)  Continuous    Question Answer Comment  In the event of cardiac or respiratory ARREST: Initiate Code Blue, Call Rapid Response Yes   In the event of cardiac or respiratory ARREST: Perform CPR Yes   In the event of cardiac or respiratory ARREST: Perform Intubation/Mechanical Ventilation No   In the event of cardiac or respiratory ARREST: Use NIPPV/BiPAp only if indicated Yes   In the event of cardiac or respiratory ARREST: Administer ACLS medications if indicated Yes   In the event of cardiac or respiratory ARREST: Perform Defibrillation or Cardioversion if indicated Yes      05/14/18 1351    Code Status History    Date Active  Date Inactive Code Status Order ID Comments User Context   02/08/2018 [place] 02/09/2018 1920 Full Code [id number]  Vaughan Basta, MD Inpatient   02/05/2018 2025 02/07/2018 2119 Full Code [id number]  Hillary Bow, MD ED   04/29/2017 1314 05/02/2017 2148 Full Code [id number]  Epifanio Lesches, MD ED   12/23/2016 1745 12/25/2016 1849 Full Code [id number]  Theodoro Grist, MD ED   11/06/2016 0900 11/07/2016 1853 Full Code [id number]  Max Sane, MD Inpatient   11/05/2016 1401 11/06/2016 0900 Partial Code [id number]  Bettey Costa, MD Inpatient   06/03/2016 0253 06/05/2016 1945 Full Code [id number]  Quintella Baton, MD Inpatient   11/19/2015 1218 11/24/2015 1624 Full Code [id number]  Demetrios Loll, MD Inpatient   11/09/2015 2302 11/13/2015 1851 Full Code [id number]  Lytle Butte, MD ED   10/17/2015 2226 10/22/2015 1303 Full Code [id number]  Fritzi Mandes, MD Inpatient   10/09/2015 1738 10/12/2015 1934 Full Code [id number]  Demetrios Loll, MD Inpatient   08/01/2015 0008 08/03/2015 1454 Full Code [id number]  Lance Coon, MD Inpatient    Advance Directive Documentation     Most Recent Value  Type of Advance Directive  Living will  Pre-existing out of facility DNR order (yellow form or pink MOST form)  -  "MOST" Form in Place?  -      TOTAL TIME TAKING CARE OF THIS PATIENT: 35 minutes.    Loletha Grayer M.D on 05/21/2018 at 3:30 PM  Between 7am to 6pm - Pager - [phone]  After 6pm go to [email or web address] - password [email or web address]  [employer] Office  [phone]  CC: Primary care physician; Birdie Sons, MD

## 2018-05-21 NOTE — Care Management (Signed)
Discharge to home today per Dr. Renae GlossWieting. Open to [facility] for Home Health services.  Feliberto GottronJason Hinton, [facility] representative updated. Gwenette GreetBrenda S Carron Mcmurry RN MSN CCM Care Management [phone]

## 2018-05-21 NOTE — Discharge Instructions (Signed)
Can restart plavix in a few days

## 2018-05-21 NOTE — Progress Notes (Signed)
Brace applied to left wrist as ordered. Walker delivered. Bo McclintockBrewer,Taylyn Brame S, RN

## 2018-05-21 NOTE — NC FL2 (Signed)
[place] MEDICAID FL2 LEVEL OF CARE SCREENING TOOL     IDENTIFICATION  Patient Name: Cheryl [name] Birthdate: Oct 05, 1951 Sex: female Admission Date (Current Location): 05/14/2018  [place] and [place] Number:  Chiropodist and Address:  [facility], [address] [place], [place] [address]      Provider Number: [id number]  Attending Physician Name and Address:  Alford Highland, MD  Relative Name and Phone Number:  Cheryl [name]- Spouse [phone]    Current Level of Care: Hospital Recommended Level of Care: Skilled Nursing Facility Prior Approval Number:    Date Approved/Denied:   PASRR Number: [id number]  Discharge Plan: SNF    Current Diagnoses: Patient Active Problem List   Diagnosis Date Noted  . Gastritis without bleeding   . Hypoxia 05/14/2018  . Generalized weakness 02/08/2018  . Rib fracture 09/19/2017  . Marijuana use 06/05/2017  . Pneumonia 04/29/2017  . Acute postoperative pain 04/14/2017  . Metabolic encephalopathy 12/23/2016  . Hypokalemia 12/23/2016  . Protein-calorie malnutrition, severe 11/06/2016  . Nausea with vomiting   . Lumbar facet syndrome (Location of Primary Source of Pain) (Bilateral) (L>R) 03/20/2016  . Lumbar spondylosis 03/20/2016  . Opiate use (160 MME/Day) 02/28/2016  . Anemia 02/28/2016  . Osteoarthrosis 12/18/2015  . Long term current use of anticoagulant therapy (Plavix) 12/18/2015  . Long term current use of opiate analgesic 12/04/2015  . Fibromyalgia 12/04/2015  . Dysphagia 11/24/2015  . Acute diastolic CHF (congestive heart failure) (HCC) 11/24/2015  . Leukocytosis 11/24/2015  . Abnormal mammogram of right breast 10/11/2015  . Dilated intrahepatic bile duct 10/11/2015  . Lumbar radicular pain (Bilateral) (L>R) (L4) 09/04/2015  . Chronic low back pain (Location of Primary Source of Pain) (Bilateral) (L>R) 09/04/2015  . Encounter for therapeutic drug level monitoring 09/04/2015  .  Uncomplicated opioid dependence (HCC) 09/04/2015  . Chronic pain syndrome 09/04/2015  . Platelet inhibition due to Plavix 09/04/2015  . COPD (chronic obstructive pulmonary disease) (HCC) 07/31/2015  . Dementia 07/31/2015  . Airway hyperreactivity 07/03/2015  . Back pain, thoracic 07/03/2015  . Excessive falling 07/03/2015  . Alteration in bowel elimination: incontinence 07/03/2015  . Insomnia 07/03/2015  . Decreased testosterone level 07/03/2015  . Leg weakness 07/03/2015  . Menopausal symptom 07/03/2015  . Migraine 07/03/2015  . Neuropathy (HCC) 07/03/2015  . Fecal occult blood test positive 07/03/2015  . OP (osteoporosis) 07/03/2015  . Panic disorder 07/03/2015  . Compulsive tobacco user syndrome 07/03/2015  . Urinary incontinence 07/03/2015  . Weight loss 07/03/2015  . Essential hypertension 06/06/2015  . GERD (gastroesophageal reflux disease) 06/06/2015  . Hyperlipemia 06/06/2015  . Peripheral nerve disease 04/13/2014  . Anxiety 02/10/2014  . Coronary artery disease 02/10/2014  . Peripheral vascular disease (HCC) 02/10/2014  . Vitamin D deficiency 10/16/2009    Orientation RESPIRATION BLADDER Height & Weight     Self, Time, Place  Normal Continent Weight: 90 lb (40.8 kg) Height:  4\' 10"  (147.3 cm)  BEHAVIORAL SYMPTOMS/MOOD NEUROLOGICAL BOWEL NUTRITION STATUS  (none) (None) Continent Diet(Soft)  AMBULATORY STATUS COMMUNICATION OF NEEDS Skin   Extensive Assist Verbally Normal                       Personal Care Assistance Level of Assistance  Bathing, Feeding, Dressing Bathing Assistance: Limited assistance Feeding assistance: Independent Dressing Assistance: Limited assistance     Functional Limitations Info  Sight, Hearing, Speech Sight Info: Adequate Hearing Info: Adequate Speech Info: Adequate    SPECIAL CARE  FACTORS FREQUENCY  PT (By licensed PT), OT (By licensed OT)     PT Frequency: 5x/week OT Frequency: 5x/week            Contractures  Contractures Info: Not present    Additional Factors Info  Code Status, Allergies Code Status Info: Partial Code Allergies Info: Percocet Oxycodone-acetaminophen, Aspirin, Codeine, Propoxyphene, Sulfa Antibiotics           Current Medications (05/21/2018):  This is the current hospital active medication list Current Facility-Administered Medications  Medication Dose Route Frequency Provider Last Rate Last Dose  . acetaminophen (TYLENOL) tablet 650 mg  650 mg Oral Q6H PRN Ihor Austin, MD       Or  . acetaminophen (TYLENOL) suppository 650 mg  650 mg Rectal Q6H PRN Ihor Austin, MD      . ALPRAZolam Prudy Feeler) tablet 0.5 mg  0.5 mg Oral QHS PRN Barbaraann Rondo, MD   0.5 mg at 05/19/18 2026  . amLODipine (NORVASC) tablet 5 mg  5 mg Oral Daily Alford Highland, MD   5 mg at 05/20/18 0850  . ciprofloxacin (CIPRO) tablet 500 mg  500 mg Oral BID Alford Highland, MD   500 mg at 05/20/18 2113  . colchicine tablet 0.6 mg  0.6 mg Oral Daily Alford Highland, MD   0.6 mg at 05/20/18 1736  . cyanocobalamin ((VITAMIN B-12)) injection 1,000 mcg  1,000 mcg Intramuscular Daily Alford Highland, MD   1,000 mcg at 05/20/18 0851  . donepezil (ARICEPT) tablet 10 mg  10 mg Oral QHS Ihor Austin, MD   10 mg at 05/20/18 2115  . DULoxetine (CYMBALTA) DR capsule 30 mg  30 mg Oral Daily Pyreddy, Vivien Rota, MD   30 mg at 05/20/18 0850  . enoxaparin (LOVENOX) injection 30 mg  30 mg Subcutaneous Q24H Houston Siren, MD   30 mg at 05/20/18 2113  . estradiol (ESTRACE) tablet 0.5 mg  0.5 mg Oral Daily Alford Highland, MD   0.5 mg at 05/20/18 1454  . famotidine (PEPCID) IVPB 20 mg premix  20 mg Intravenous Q24H Alford Highland, MD   Stopped at 05/20/18 1527  . fentaNYL (DURAGESIC - dosed mcg/hr) 50 mcg  50 mcg Transdermal Q72H Alford Highland, MD   50 mcg at 05/19/18 1053  . ipratropium-albuterol (DUONEB) 0.5-2.5 (3) MG/3ML nebulizer solution 3 mL  3 mL Nebulization Q6H PRN Houston Siren, MD   3 mL at 05/16/18  2017  . ipratropium-albuterol (DUONEB) 0.5-2.5 (3) MG/3ML nebulizer solution 3 mL  3 mL Nebulization Once Piscitello, Cleda Mccreedy, MD      . mometasone-formoterol [facility]) 200-5 MCG/ACT inhaler 2 puff  2 puff Inhalation BID Alford Highland, MD   2 puff at 05/20/18 2115  . nicotine (NICODERM CQ - dosed in mg/24 hours) patch 21 mg  21 mg Transdermal Daily Ihor Austin, MD   21 mg at 05/20/18 0902  . ondansetron (ZOFRAN) tablet 4 mg  4 mg Oral Q6H PRN Ihor Austin, MD       Or  . ondansetron (ZOFRAN) injection 4 mg  4 mg Intravenous Q6H PRN Ihor Austin, MD   4 mg at 05/20/18 0847  . pantoprazole (PROTONIX) EC tablet 40 mg  40 mg Oral BID Ihor Austin, MD   40 mg at 05/20/18 2114  . predniSONE (DELTASONE) tablet 10 mg  10 mg Oral Q breakfast Alford Highland, MD   10 mg at 05/20/18 1736  . pregabalin (LYRICA) capsule 75 mg  75 mg Oral BID Alford Highland, MD  75 mg at 05/20/18 2113  . promethazine (PHENERGAN) injection 12.5 mg  12.5 mg Intravenous Q6H PRN Cammy CopaMaier, Angela, MD   12.5 mg at 05/19/18 2026  . senna-docusate (Senokot-S) tablet 1 tablet  1 tablet Oral QHS PRN Ihor AustinPyreddy, Pavan, MD      . simvastatin (ZOCOR) tablet 10 mg  10 mg Oral QHS Ihor AustinPyreddy, Pavan, MD   10 mg at 05/20/18 2113  . sodium chloride flush (NS) 0.9 % injection 10-40 mL  10-40 mL Intracatheter PRN Alford HighlandWieting, Richard, MD      . tiotropium [facility](SPIRIVA) inhalation capsule 18 mcg  18 mcg Inhalation Daily Alford HighlandWieting, Richard, MD   18 mcg at 05/20/18 1455  . traMADol (ULTRAM) tablet 50 mg  50 mg Oral Q6H PRN Ihor AustinPyreddy, Pavan, MD   50 mg at 05/20/18 2129  . traZODone (DESYREL) tablet 50-100 mg  50-100 mg Oral QHS PRN Alford HighlandWieting, Richard, MD         Discharge Medications: Please see discharge summary for a list of discharge medications.  Relevant Imaging Results:  Relevant Lab Results:   Additional Information SSN [id number]  Ruthe Mannanandace  Shamar Engelmann, [place]

## 2018-05-21 NOTE — Progress Notes (Signed)
Discharge instructions given and went over with patient and patients husband at bedside. All questions answered. Patient discharged home with husband via wheelchair by volunteer services. Bo McclintockBrewer,Tashawn Greff S, RN

## 2018-05-21 NOTE — Clinical Social Work Note (Signed)
CSW consulted for SNF placement for patient. CSW spoke with patient and she is refusing SNF placement. She wants to go home with home health. Patient states that she lives with husband and would like to return home. CSW signing off. Please re consult if further needs arise.   Ruthe Mannanandace Winslow Verrill MSW, [address]CSWA [phone]

## 2018-05-22 ENCOUNTER — Telehealth: Payer: Self-pay

## 2018-05-22 ENCOUNTER — Other Ambulatory Visit: Payer: Self-pay | Admitting: *Deleted

## 2018-05-22 ENCOUNTER — Encounter: Payer: Self-pay | Admitting: Gastroenterology

## 2018-05-22 ENCOUNTER — Encounter: Payer: Self-pay | Admitting: *Deleted

## 2018-05-22 DIAGNOSIS — G609 Hereditary and idiopathic neuropathy, unspecified: Secondary | ICD-10-CM | POA: Diagnosis not present

## 2018-05-22 DIAGNOSIS — F17210 Nicotine dependence, cigarettes, uncomplicated: Secondary | ICD-10-CM | POA: Diagnosis not present

## 2018-05-22 DIAGNOSIS — M4180 Other forms of scoliosis, site unspecified: Secondary | ICD-10-CM | POA: Diagnosis not present

## 2018-05-22 DIAGNOSIS — J441 Chronic obstructive pulmonary disease with (acute) exacerbation: Secondary | ICD-10-CM | POA: Diagnosis not present

## 2018-05-22 DIAGNOSIS — G8929 Other chronic pain: Secondary | ICD-10-CM | POA: Diagnosis not present

## 2018-05-22 DIAGNOSIS — I10 Essential (primary) hypertension: Secondary | ICD-10-CM | POA: Diagnosis not present

## 2018-05-22 DIAGNOSIS — L03116 Cellulitis of left lower limb: Secondary | ICD-10-CM | POA: Diagnosis not present

## 2018-05-22 DIAGNOSIS — I2510 Atherosclerotic heart disease of native coronary artery without angina pectoris: Secondary | ICD-10-CM | POA: Diagnosis not present

## 2018-05-22 DIAGNOSIS — I739 Peripheral vascular disease, unspecified: Secondary | ICD-10-CM | POA: Diagnosis not present

## 2018-05-22 NOTE — Patient Outreach (Addendum)
[facility]) Care Management  05/22/2018  Cheryl MargaritaMary George [name] 03/13/1951 [id number]   Telephone assessment   [facility]  Referral reason: Admission 6/20-6/27 Dx: Hypoxia,AMS, Pneumonia, abdominal pain .   Transition of care by PCP office   Patient is 67 year old female with PMH that includes but not limited to : COPD, GERD,DVT, CAD, hyperlipidemia, neuropathy, chronic pain falls,    Successful outreach call to patient , explained reason for the call , HIPAA verified. Patient discussed she was in the hospital due to pneumonia, and she is glad to be back home. Patient reports that she is eating now and feeling a lot better.  Patient states her husband usually helps her keep things straight, she called for him to pick up the phone, explained the reason for the call and he states the patient has already had the doctors office  call on today, her home health nurse has visited on today.  He is unsure if patient needs someone else to come or call, reports he is not able to talk right now, states  He would call me back later today, provided my contact number.     Plan Will await return call, if no response on today will plan return call in the next 4 days to follow up on involvement with [facility] .    Egbert GaribaldiKimberly Mackay Hanauer, RN, [facility] Care Management,Care Management Coordinator  [phone]- Mobile [phone]- Toll Free Main Office

## 2018-05-22 NOTE — Telephone Encounter (Signed)
Transition Care Management Follow-up Telephone Call  How have you been since you were released from the hospital? Pt states she is doing good and eating now. Wrist is doing better with brace. Declines confusion, pain, SOB and v/d. Pt is still nausea at times but has medication to help with it.  Do you understand why you were in the hospital? yes  Do you have a copy of your discharge instructions Yes Do you understand the discharge instrcutions? yes  Where were you discharged to? Home  Do you have support at home? Yes    Items Reviewed:  Medications obtained Yes, reviewed new changes.  Medications reviewed: No, pt declined reviewing all meds at this time.  Dietary changes reviewed: no  Home Health? Yes, Agency: [facility]  DME ordered at discharge obtained? NA  Medical supplies: NA    Functional Questionnaire:   Activities of Daily Living (ADLs):   She states they are independent in the following: ambulation, bathing and hygiene, feeding, continence, grooming, toileting, dressing and medication management States they require assistance with the following: N/A  Any transportation issues/concerns?: no  Any patient concerns? no  Confirmed importance and date/time of follow-up visits scheduled with PCP: yes, 06/02/18 @ 3 PM with Dr Sherrie MustacheFisher.  Confirm appointment scheduled with specialist? NA  Confirmed with patient if condition begins to worsen call PCP or If it's emergency go to the ER.

## 2018-05-25 ENCOUNTER — Other Ambulatory Visit: Payer: Self-pay | Admitting: *Deleted

## 2018-05-25 ENCOUNTER — Ambulatory Visit
Admission: RE | Admit: 2018-05-25 | Discharge: 2018-05-25 | Disposition: A | Discharge status: Home / Self Care | Payer: Medicare HMO | Source: Ambulatory Visit | Attending: Family Medicine | Admitting: Family Medicine

## 2018-05-25 ENCOUNTER — Other Ambulatory Visit: Admit: 2018-05-25 | Payer: Medicare HMO

## 2018-05-25 ENCOUNTER — Encounter: Payer: Self-pay | Admitting: Family Medicine

## 2018-05-25 ENCOUNTER — Ambulatory Visit: Payer: Self-pay | Admitting: Family Medicine

## 2018-05-25 ENCOUNTER — Other Ambulatory Visit: Payer: Self-pay | Admitting: Family Medicine

## 2018-05-25 ENCOUNTER — Other Ambulatory Visit
Admission: RE | Admit: 2018-05-25 | Discharge: 2018-05-25 | Disposition: A | Discharge status: Home / Self Care | Payer: Medicare HMO | Source: Ambulatory Visit | Attending: Family Medicine | Admitting: Family Medicine

## 2018-05-25 ENCOUNTER — Telehealth: Payer: Self-pay | Admitting: Family Medicine

## 2018-05-25 ENCOUNTER — Ambulatory Visit (INDEPENDENT_AMBULATORY_CARE_PROVIDER_SITE_OTHER): Payer: Medicare HMO | Admitting: Family Medicine

## 2018-05-25 VITALS — BP 102/60 | HR 88 | Temp 99.7°F | Resp 18

## 2018-05-25 DIAGNOSIS — I739 Peripheral vascular disease, unspecified: Secondary | ICD-10-CM | POA: Diagnosis not present

## 2018-05-25 DIAGNOSIS — R059 Cough, unspecified: Secondary | ICD-10-CM

## 2018-05-25 DIAGNOSIS — R05 Cough: Secondary | ICD-10-CM | POA: Insufficient documentation

## 2018-05-25 DIAGNOSIS — F17210 Nicotine dependence, cigarettes, uncomplicated: Secondary | ICD-10-CM | POA: Diagnosis not present

## 2018-05-25 DIAGNOSIS — G4700 Insomnia, unspecified: Secondary | ICD-10-CM

## 2018-05-25 DIAGNOSIS — L03116 Cellulitis of left lower limb: Secondary | ICD-10-CM | POA: Diagnosis not present

## 2018-05-25 DIAGNOSIS — R4182 Altered mental status, unspecified: Secondary | ICD-10-CM

## 2018-05-25 DIAGNOSIS — G8929 Other chronic pain: Secondary | ICD-10-CM | POA: Diagnosis not present

## 2018-05-25 DIAGNOSIS — I2510 Atherosclerotic heart disease of native coronary artery without angina pectoris: Secondary | ICD-10-CM | POA: Diagnosis not present

## 2018-05-25 DIAGNOSIS — I10 Essential (primary) hypertension: Secondary | ICD-10-CM | POA: Diagnosis not present

## 2018-05-25 DIAGNOSIS — M4180 Other forms of scoliosis, site unspecified: Secondary | ICD-10-CM | POA: Diagnosis not present

## 2018-05-25 DIAGNOSIS — J984 Other disorders of lung: Secondary | ICD-10-CM | POA: Diagnosis not present

## 2018-05-25 DIAGNOSIS — G609 Hereditary and idiopathic neuropathy, unspecified: Secondary | ICD-10-CM | POA: Diagnosis not present

## 2018-05-25 DIAGNOSIS — J441 Chronic obstructive pulmonary disease with (acute) exacerbation: Secondary | ICD-10-CM | POA: Diagnosis not present

## 2018-05-25 LAB — POCT URINALYSIS DIPSTICK
APPEARANCE: NORMAL
BILIRUBIN UA: NEGATIVE
Glucose, UA: NEGATIVE
Ketones, UA: NEGATIVE
LEUKOCYTES UA: NEGATIVE
NITRITE UA: NEGATIVE
ODOR: NORMAL
PH UA: 7.5 (ref 5.0–8.0)
PROTEIN UA: NEGATIVE
RBC UA: NEGATIVE
Spec Grav, UA: <=1.005 — AB (ref 1.010–1.025)
UROBILINOGEN UA: 0.2 U/dL

## 2018-05-25 LAB — COMPREHENSIVE METABOLIC PANEL
ALT: 8 U/L (ref 0–44)
ANION GAP: 11 (ref 5–15)
AST: 16 U/L (ref 15–41)
Albumin: 3 g/dL — ABNORMAL LOW (ref 3.5–5.0)
Alkaline Phosphatase: 64 U/L (ref 38–126)
BUN: 13 mg/dL (ref 8–23)
CHLORIDE: 97 mmol/L — AB (ref 98–111)
CO2: 32 mmol/L (ref 22–32)
Calcium: 8.1 mg/dL — ABNORMAL LOW (ref 8.9–10.3)
Creatinine, Ser: 0.96 mg/dL (ref 0.44–1.00)
GFR calc Af Amer: >60 mL/min (ref >60)
GFR, EST NON AFRICAN AMERICAN: 59 mL/min — AB (ref >60)
Glucose, Bld: 84 mg/dL (ref 70–99)
POTASSIUM: 4.7 mmol/L (ref 3.5–5.1)
Sodium: 140 mmol/L (ref 135–145)
Total Bilirubin: 0.7 mg/dL (ref 0.3–1.2)
Total Protein: 6.1 g/dL — ABNORMAL LOW (ref 6.5–8.1)

## 2018-05-25 LAB — CBC
HCT: 25.4 % — ABNORMAL LOW (ref 35.0–47.0)
Hemoglobin: 8.4 g/dL — ABNORMAL LOW (ref 12.0–16.0)
MCH: 27.3 pg (ref 26.0–34.0)
MCHC: 33.2 g/dL (ref 32.0–36.0)
MCV: 82.3 fL (ref 80.0–100.0)
Platelets: 440 K/uL (ref 150–440)
RBC: 3.09 MIL/uL — ABNORMAL LOW (ref 3.80–5.20)
RDW: 19.7 % — ABNORMAL HIGH (ref 11.5–14.5)
WBC: 22.1 K/uL — ABNORMAL HIGH (ref 3.6–11.0)

## 2018-05-25 MED ORDER — ONDANSETRON HCL 4 MG PO TABS: 2.0000 mg | ORAL_TABLET | Freq: Four times a day (QID) | ORAL | 0 refills | Status: DC | PRN

## 2018-05-25 MED ORDER — ALPRAZOLAM 0.5 MG PO TABS: 0.2500 mg | ORAL_TABLET | Freq: Every evening | ORAL | 0 refills | Status: DC | PRN

## 2018-05-25 NOTE — Patient Outreach (Signed)
[facility]N) Care Management  05/25/2018  Sueanne MargaritaMary George Weyrauch February 22, 1951 [id number]   Telephone assessment   [facility]  Referral reason: Admission 6/20-6/27 Dx: Hypoxia,AMS, Pneumonia, abdominal pain .   Transition of care by PCP office   Patient is 67 year old female with PMH that includes but not limited to : COPD, GERD,DVT, CAD, hyperlipidemia, neuropathy, chronic pain falls,    Outreach call to patient contact number, person answering phone identified as her husband Jamse BelfastFrank Steinborn, HIPAA . Explained reason for the call, he states he does not have time to talk right now , call him back on tomorrow  he has to take patient back to doctor on this afte noon.  Plan  Will plan return call on the next day., unsuccessful on last 2 call to engage for [facility] services. Egbert Garibaldi.    Kimberly Glover, RN, [facility] Care Management,Care Management Coordinator  [phone]- Mobile [phone]- Toll Free Main Office

## 2018-05-25 NOTE — Progress Notes (Signed)
Patient: Cheryl [name] Female    DOB: 01/31/1951   67 y.o.   MRN: [id number] Visit Date: 05/25/2018  Today's Provider: Lelon Huh, MD   No chief complaint on file.  Subjective:    HPI  Patient's home health nurse called this morning stating that patient was lethargic and her 02 was at 74%. Oxygen was applied raising 02 level to 93%. Patient also had an elevated temperature of 100. Patient is here with husband and reports she is greatly  Improved from this morning. She has had persistent cough for weeks, but no worse than usual, does not feel short of breath. She id on Fentanul patch and takes alprazolam to help sleep, but states has not taken any today. She also has prescription for Zofran, but states has not taken for several days. No chills or sweats. No chest pains. No other neurological symptoms.    Allergies  Allergen Reactions  . Percocet [Oxycodone-Acetaminophen] Hives and Rash  . Aspirin Nausea And Vomiting and Other (See Comments)    Reaction:  GI upset   . Codeine Nausea And Vomiting, Other (See Comments) and Nausea Only    Reaction:  GI upset   . Propoxyphene Other (See Comments) and Nausea Only    GI upset Reaction:  GI upset   . Sulfa Antibiotics Rash and Other (See Comments)    Reaction:  GI upset      Current Outpatient Medications:  .  acetaminophen (TYLENOL) 325 MG tablet, Take 2 tablets (650 mg total) by mouth every 6 (six) hours as needed for mild pain (or Fever >/= 101)., Disp: 30 tablet, Rfl: 0 .  acetaminophen (TYLENOL) 325 MG tablet, Take 2 tablets (650 mg total) by mouth every 6 (six) hours as needed for mild pain (or Fever >/= 101)., Disp: , Rfl:  .  albuterol (PROVENTIL) (2.5 MG/3ML) 0.083% nebulizer solution, Take 3 mLs (2.5 mg total) by nebulization every 4 (four) hours as needed for wheezing., Disp: 50 vial, Rfl: 12 .  alendronate (FOSAMAX) 70 MG tablet, Take 1 tablet (70 mg total) by mouth every 7 (seven) days. Take with a full glass of  water on an empty stomach., Disp: 4 tablet, Rfl: 11 .  ALPRAZolam (XANAX) 0.5 MG tablet, take 1 tablet by mouth three times a day if needed, Disp: 60 tablet, Rfl: 0 .  amLODipine (NORVASC) 5 MG tablet, Take 1 tablet (5 mg total) by mouth daily., Disp: 30 tablet, Rfl: 0 .  ciprofloxacin (CIPRO) 500 MG tablet, Take 1 tablet (500 mg total) by mouth 2 (two) times daily., Disp: 10 tablet, Rfl: 0 .  clopidogrel (PLAVIX) 75 MG tablet, Take 75 mg by mouth daily., Disp: , Rfl:  .  cyclobenzaprine (FLEXERIL) 5 MG tablet, Take 1 tablet (5 mg total) by mouth 3 (three) times daily as needed for muscle spasms., Disp: 30 tablet, Rfl: 5 .  donepezil (ARICEPT) 10 MG tablet, Take 10 mg by mouth at bedtime., Disp: , Rfl:  .  DULoxetine (CYMBALTA) 30 MG capsule, Take 1 capsule by mouth daily., Disp: , Rfl:  .  estradiol (ESTRACE) 0.5 MG tablet, Take 1 tablet (0.5 mg total) by mouth daily. (Take in place of estropipate), Disp: 30 tablet, Rfl: 5 .  famotidine (PEPCID) 20 MG tablet, Take 1 tablet (20 mg total) by mouth daily., Disp: 30 tablet, Rfl: 0 .  [START ON 07/21/2018] fentaNYL (DURAGESIC - DOSED MCG/HR) 50 MCG/HR, Place 1 patch (50 mcg total) onto the skin  every 3 (three) days., Disp: 10 patch, Rfl: 0 .  Fluticasone-Salmeterol (ADVAIR DISKUS) 250-50 MCG/DOSE AEPB, inhale 1 dose by mouth twice a day, Disp: 60 each, Rfl: 5 .  NARCAN 4 MG/0.1ML LIQD nasal spray kit, Place 0.4 mg into the nose once. , Disp: , Rfl:  .  nicotine (NICODERM CQ - DOSED IN MG/24 HOURS) 21 mg/24hr patch, Place 1 patch (21 mg total) onto the skin daily. Okay to substitute generic patch, Disp: 28 patch, Rfl: 0 .  ondansetron (ZOFRAN) 4 MG tablet, Take 1 tablet (4 mg total) by mouth every 6 (six) hours as needed for nausea., Disp: 20 tablet, Rfl: 0 .  pantoprazole (PROTONIX) 40 MG tablet, Take 1 tablet (40 mg total) by mouth 2 (two) times daily., Disp: 60 tablet, Rfl: 12 .  potassium chloride (K-DUR) 10 MEQ tablet, Take 1 tablet by mouth daily.,  Disp: , Rfl:  .  predniSONE (DELTASONE) 10 MG tablet, Take 1 tablet (10 mg total) by mouth daily with breakfast for 5 days., Disp: 5 tablet, Rfl: 0 .  pregabalin (LYRICA) 75 MG capsule, Take 1 capsule (75 mg total) by mouth 2 (two) times daily., Disp: 60 capsule, Rfl: 0 .  simvastatin (ZOCOR) 10 MG tablet, Take 10 mg by mouth at bedtime. , Disp: , Rfl:  .  tiotropium (SPIRIVA HANDIHALER) 18 MCG inhalation capsule, inhale the contents of one capsule in the handihaler once daily, Disp: 30 capsule, Rfl: 12 .  traZODone (DESYREL) 50 MG tablet, Take 1-2 tablets (50-100 mg total) by mouth at bedtime., Disp: 30 tablet, Rfl: 3 .  triamcinolone ointment (KENALOG) 0.5 %, Apply to lesions on hands twice a day as needed, Disp: 30 g, Rfl: 1 .  vitamin B-12 (CYANOCOBALAMIN) 1000 MCG tablet, Take 1 tablet (1,000 mcg total) by mouth daily., Disp: 30 tablet, Rfl: 0  Review of Systems  Constitutional: Negative for appetite change, chills, fatigue and fever.  Respiratory: Negative for chest tightness and shortness of breath.   Cardiovascular: Negative for chest pain and palpitations.  Gastrointestinal: Negative for abdominal pain, nausea and vomiting.  Neurological: Negative for dizziness and weakness.    Social History   Tobacco Use  . Smoking status: Current Every Day Smoker    Packs/day: 1.00    Years: 50.00    Pack years: 50.00    Types: Cigarettes  . Smokeless tobacco: Never Used  . Tobacco comment: Previously smoked 2 ppd  Substance Use Topics  . Alcohol use: No    Alcohol/week: 0.0 oz   Objective:    Vitals:   05/25/18 1625 05/25/18 1643  BP: (!) 84/40 102/60  Pulse: 88   Resp: 18   Temp: 99.7 F (37.6 C)   TempSrc: Oral   SpO2: 91%   on room air.    Physical Exam   General Appearance:    Alert, cooperative, no distress, very thin, but not ill appearing. .   Eyes:    PERRL, conjunctiva/corneas clear, EOM's intact       Lungs:     Clear to auscultation bilaterally, respirations  unlabored, occasional scattered inspiratory crackles. Good air movement, unchanged from baseline.   Heart:    Regular rate and rhythm  Neurologic:   Awake, alert, oriented x 3. No apparent focal neurological           defect.       Results for orders placed or performed in visit on 05/25/18  POCT urinalysis dipstick  Result Value Ref Range   Color,  UA Yellow    Clarity, UA Clear    Glucose, UA Negative Negative   Bilirubin, UA Neg    Ketones, UA Neg    Spec Grav, UA <=1.005 (A) 1.010 - 1.025   Blood, UA Neg    pH, UA 7.5 5.0 - 8.0   Protein, UA Negative Negative   Urobilinogen, UA 0.2 0.2 or 1.0 E.U./dL   Nitrite, UA Neg    Leukocytes, UA Negative Negative   Appearance Normal    Odor Normal         Assessment & Plan:     1. Altered mental status, unspecified altered mental status type Transient this morning, now resolved. She was mildly hyponatremic at recently hospitalization. Will check labs at [facility] outpatient lab today.  - POCT urinalysis dipstick - Comprehensive metabolic panel - CBC  Counseled on potential interaction between Fentanyl and alprazolam and that potential for interaction is even greater when acutely ill. Will reduce alprazolam to 1/2 tablet with goal of weaning off completely.   2. Cough  - DG Chest 2 View; Future       Lelon Huh, MD  [facility]

## 2018-05-25 NOTE — Patient Instructions (Addendum)
   [facility]   [facility]   Wear your oxygen at 2 lpm at all times   Take no more than 1/2 tablet of alprazolam at bedtime

## 2018-05-25 NOTE — Telephone Encounter (Signed)
Cheryl [name] with [facility] called for orders for care for pt.  Pt is declining and refusing to go to ER.  O2 stats are low. 74% when they got there and when they put her on oxygen it whet up to 93%   Please advise  [phone]  [facility]

## 2018-05-25 NOTE — Telephone Encounter (Addendum)
Called Cheryl [name] back to see what she needs. Cheryl [name] stated they need verbal order for continuity of care. Also schedule pt at 4:00 pm today for ov with Dr. Sherrie MustacheFisher concerning low O2 and patient being lethargic. Please advise orders.

## 2018-05-26 ENCOUNTER — Telehealth: Payer: Self-pay | Admitting: *Deleted

## 2018-05-26 ENCOUNTER — Telehealth: Payer: Self-pay | Admitting: Family Medicine

## 2018-05-26 ENCOUNTER — Other Ambulatory Visit: Payer: Self-pay | Admitting: *Deleted

## 2018-05-26 DIAGNOSIS — I739 Peripheral vascular disease, unspecified: Secondary | ICD-10-CM | POA: Diagnosis not present

## 2018-05-26 DIAGNOSIS — G609 Hereditary and idiopathic neuropathy, unspecified: Secondary | ICD-10-CM | POA: Diagnosis not present

## 2018-05-26 DIAGNOSIS — I10 Essential (primary) hypertension: Secondary | ICD-10-CM | POA: Diagnosis not present

## 2018-05-26 DIAGNOSIS — G8929 Other chronic pain: Secondary | ICD-10-CM | POA: Diagnosis not present

## 2018-05-26 DIAGNOSIS — J441 Chronic obstructive pulmonary disease with (acute) exacerbation: Secondary | ICD-10-CM | POA: Diagnosis not present

## 2018-05-26 DIAGNOSIS — F17210 Nicotine dependence, cigarettes, uncomplicated: Secondary | ICD-10-CM | POA: Diagnosis not present

## 2018-05-26 DIAGNOSIS — L03116 Cellulitis of left lower limb: Secondary | ICD-10-CM | POA: Diagnosis not present

## 2018-05-26 DIAGNOSIS — I2510 Atherosclerotic heart disease of native coronary artery without angina pectoris: Secondary | ICD-10-CM | POA: Diagnosis not present

## 2018-05-26 DIAGNOSIS — M4180 Other forms of scoliosis, site unspecified: Secondary | ICD-10-CM | POA: Diagnosis not present

## 2018-05-26 NOTE — Patient Outreach (Addendum)
[facility]) Care Management  05/26/2018  [name] [birth year] [id number]   Telephone assessment   [name]  Referral reason: Admission 6/20-6/27 Dx: Hypoxia,AMS, Pneumonia, abdominal pain . Insurance: [employer]    Transition of care by PCP office   Patient is 67 year old female with PMH that includes but not limited to : COPD, GERD,DVT, CAD, hyperlipidemia, neuropathy, chronic pain falls,    Outreach call to patient to engage for [facility], spoke with patient regarding care management services.  Successful call able to speak with  spouse Jamse BelfastFrank Zanetti, patient HIPAA verified, he reports patient is resting at this time.  Explained purpose of call and [facility], he reports  that they are managing well at home and have everything that they need. Discussed that they have [facility] working with them.  Reinforced that [facility] care management does not interfere with home health services and is a benefit of insurance plan. .Mr. Winnifred FriarRobey reports that he  takes care of her at home as well as her granddaughter is supportive to assist as needed.  Mr.Hudspeth reports patient has all of her medications and he keeps them organized for her, reports visit to Dr.Fisher on yesterday and he made adjustment to her organizer. Mr.Pennisi reports patient is eating a little better. Mr.Nicole reports he is busy cooking at this time and does not have time to further discuss .   Mr.Mineau agreed to follow up call to patient in the next week when things settle down a little,  regarding involvement with [facility] he is unsure of need at this time and she is resting.   Unable to start in [facility] and mark case as active until agreement .    Plan .  Will plan return call in the next week per request.    Egbert GaribaldiKimberly Samvel Zinn, RN, [facility] Care Management,Care Management Coordinator   [phone]- Mobile [phone]- Toll Free Main Office

## 2018-05-26 NOTE — Telephone Encounter (Signed)
Please advise 

## 2018-05-26 NOTE — Telephone Encounter (Signed)
Patient was notified of results. Patient states she is taking vitamin B12. Patient stated that she will not drink supplemental drinks. However she will take the otc iron.

## 2018-05-26 NOTE — Telephone Encounter (Signed)
-----   Message from Malva Limesonald E Fisher, MD sent at 05/26/2018  9:21 AM EDT ----- Chest xray normal. She is anemic and malnourished. Please make sure she is taking vitamin B12 that was prescribed by Dr. Inda CastleWeitling on 05-21-2018.  She also needs to take an OTC women's vitamin and iron sulfate 325mg  once a day. Should also needs to drink boost or ensure 2-3 times a day (between meals)  Need to follow up in 2 weeks (can reschedule the 06-02-2018 appointment)

## 2018-05-26 NOTE — Telephone Encounter (Signed)
Chris with [facility] is requesting verbal orders for in home physical therapy as follows:  Twice a week for 6 weeks.   Please advise. Thanks TNP

## 2018-05-26 NOTE — Telephone Encounter (Signed)
That's fine

## 2018-05-27 NOTE — Telephone Encounter (Signed)
Thayer OhmChris was advised.

## 2018-05-28 DIAGNOSIS — G609 Hereditary and idiopathic neuropathy, unspecified: Secondary | ICD-10-CM | POA: Diagnosis not present

## 2018-05-28 DIAGNOSIS — F17210 Nicotine dependence, cigarettes, uncomplicated: Secondary | ICD-10-CM | POA: Diagnosis not present

## 2018-05-28 DIAGNOSIS — J441 Chronic obstructive pulmonary disease with (acute) exacerbation: Secondary | ICD-10-CM | POA: Diagnosis not present

## 2018-05-28 DIAGNOSIS — I2510 Atherosclerotic heart disease of native coronary artery without angina pectoris: Secondary | ICD-10-CM | POA: Diagnosis not present

## 2018-05-28 DIAGNOSIS — I739 Peripheral vascular disease, unspecified: Secondary | ICD-10-CM | POA: Diagnosis not present

## 2018-05-28 DIAGNOSIS — M4180 Other forms of scoliosis, site unspecified: Secondary | ICD-10-CM | POA: Diagnosis not present

## 2018-05-28 DIAGNOSIS — L03116 Cellulitis of left lower limb: Secondary | ICD-10-CM | POA: Diagnosis not present

## 2018-05-28 DIAGNOSIS — G8929 Other chronic pain: Secondary | ICD-10-CM | POA: Diagnosis not present

## 2018-05-28 DIAGNOSIS — I10 Essential (primary) hypertension: Secondary | ICD-10-CM | POA: Diagnosis not present

## 2018-06-01 ENCOUNTER — Telehealth: Payer: Self-pay

## 2018-06-01 ENCOUNTER — Other Ambulatory Visit: Payer: Self-pay

## 2018-06-01 ENCOUNTER — Encounter: Payer: Self-pay | Admitting: Emergency Medicine

## 2018-06-01 ENCOUNTER — Ambulatory Visit: Payer: Self-pay | Admitting: *Deleted

## 2018-06-01 ENCOUNTER — Inpatient Hospital Stay
Admission: EM | Admit: 2018-06-01 | Discharge: 2018-06-05 | DRG: 193 | Disposition: A | Discharge status: Home Health | Payer: Medicare HMO | Attending: Internal Medicine | Admitting: Internal Medicine

## 2018-06-01 ENCOUNTER — Emergency Department: Payer: Medicare HMO

## 2018-06-01 DIAGNOSIS — I10 Essential (primary) hypertension: Secondary | ICD-10-CM | POA: Diagnosis present

## 2018-06-01 DIAGNOSIS — J9621 Acute and chronic respiratory failure with hypoxia: Secondary | ICD-10-CM | POA: Diagnosis present

## 2018-06-01 DIAGNOSIS — Z886 Allergy status to analgesic agent status: Secondary | ICD-10-CM | POA: Diagnosis not present

## 2018-06-01 DIAGNOSIS — M4180 Other forms of scoliosis, site unspecified: Secondary | ICD-10-CM | POA: Diagnosis not present

## 2018-06-01 DIAGNOSIS — Z90710 Acquired absence of both cervix and uterus: Secondary | ICD-10-CM | POA: Diagnosis not present

## 2018-06-01 DIAGNOSIS — L03116 Cellulitis of left lower limb: Secondary | ICD-10-CM | POA: Diagnosis not present

## 2018-06-01 DIAGNOSIS — Z882 Allergy status to sulfonamides status: Secondary | ICD-10-CM

## 2018-06-01 DIAGNOSIS — F17210 Nicotine dependence, cigarettes, uncomplicated: Secondary | ICD-10-CM | POA: Diagnosis present

## 2018-06-01 DIAGNOSIS — Z9049 Acquired absence of other specified parts of digestive tract: Secondary | ICD-10-CM

## 2018-06-01 DIAGNOSIS — E785 Hyperlipidemia, unspecified: Secondary | ICD-10-CM | POA: Diagnosis present

## 2018-06-01 DIAGNOSIS — J9620 Acute and chronic respiratory failure, unspecified whether with hypoxia or hypercapnia: Secondary | ICD-10-CM | POA: Diagnosis present

## 2018-06-01 DIAGNOSIS — R0902 Hypoxemia: Secondary | ICD-10-CM | POA: Diagnosis present

## 2018-06-01 DIAGNOSIS — E611 Iron deficiency: Secondary | ICD-10-CM | POA: Diagnosis not present

## 2018-06-01 DIAGNOSIS — J441 Chronic obstructive pulmonary disease with (acute) exacerbation: Secondary | ICD-10-CM | POA: Diagnosis present

## 2018-06-01 DIAGNOSIS — I739 Peripheral vascular disease, unspecified: Secondary | ICD-10-CM | POA: Diagnosis present

## 2018-06-01 DIAGNOSIS — J449 Chronic obstructive pulmonary disease, unspecified: Secondary | ICD-10-CM | POA: Diagnosis not present

## 2018-06-01 DIAGNOSIS — Z8701 Personal history of pneumonia (recurrent): Secondary | ICD-10-CM | POA: Diagnosis not present

## 2018-06-01 DIAGNOSIS — G8929 Other chronic pain: Secondary | ICD-10-CM | POA: Diagnosis not present

## 2018-06-01 DIAGNOSIS — Z885 Allergy status to narcotic agent status: Secondary | ICD-10-CM

## 2018-06-01 DIAGNOSIS — Z801 Family history of malignant neoplasm of trachea, bronchus and lung: Secondary | ICD-10-CM | POA: Diagnosis not present

## 2018-06-01 DIAGNOSIS — Z90722 Acquired absence of ovaries, bilateral: Secondary | ICD-10-CM

## 2018-06-01 DIAGNOSIS — J440 Chronic obstructive pulmonary disease with acute lower respiratory infection: Secondary | ICD-10-CM | POA: Diagnosis not present

## 2018-06-01 DIAGNOSIS — Z79899 Other long term (current) drug therapy: Secondary | ICD-10-CM | POA: Diagnosis not present

## 2018-06-01 DIAGNOSIS — I2510 Atherosclerotic heart disease of native coronary artery without angina pectoris: Secondary | ICD-10-CM | POA: Diagnosis present

## 2018-06-01 DIAGNOSIS — A419 Sepsis, unspecified organism: Secondary | ICD-10-CM | POA: Diagnosis not present

## 2018-06-01 DIAGNOSIS — Y95 Nosocomial condition: Secondary | ICD-10-CM | POA: Diagnosis present

## 2018-06-01 DIAGNOSIS — Z888 Allergy status to other drugs, medicaments and biological substances status: Secondary | ICD-10-CM | POA: Diagnosis not present

## 2018-06-01 DIAGNOSIS — G609 Hereditary and idiopathic neuropathy, unspecified: Secondary | ICD-10-CM | POA: Diagnosis not present

## 2018-06-01 DIAGNOSIS — D638 Anemia in other chronic diseases classified elsewhere: Secondary | ICD-10-CM | POA: Diagnosis present

## 2018-06-01 DIAGNOSIS — K219 Gastro-esophageal reflux disease without esophagitis: Secondary | ICD-10-CM | POA: Diagnosis present

## 2018-06-01 DIAGNOSIS — F419 Anxiety disorder, unspecified: Secondary | ICD-10-CM | POA: Diagnosis present

## 2018-06-01 DIAGNOSIS — J188 Other pneumonia, unspecified organism: Secondary | ICD-10-CM | POA: Diagnosis not present

## 2018-06-01 DIAGNOSIS — Z8249 Family history of ischemic heart disease and other diseases of the circulatory system: Secondary | ICD-10-CM | POA: Diagnosis not present

## 2018-06-01 DIAGNOSIS — F0390 Unspecified dementia without behavioral disturbance: Secondary | ICD-10-CM | POA: Diagnosis not present

## 2018-06-01 DIAGNOSIS — G629 Polyneuropathy, unspecified: Secondary | ICD-10-CM | POA: Diagnosis present

## 2018-06-01 DIAGNOSIS — R05 Cough: Secondary | ICD-10-CM | POA: Diagnosis not present

## 2018-06-01 DIAGNOSIS — J189 Pneumonia, unspecified organism: Secondary | ICD-10-CM | POA: Diagnosis not present

## 2018-06-01 DIAGNOSIS — Z981 Arthrodesis status: Secondary | ICD-10-CM

## 2018-06-01 DIAGNOSIS — D649 Anemia, unspecified: Secondary | ICD-10-CM | POA: Diagnosis not present

## 2018-06-01 LAB — CBC WITH DIFFERENTIAL/PLATELET
Basophils Absolute: 0.2 10*3/uL — ABNORMAL HIGH (ref 0–0.1)
Basophils Relative: 1 %
Eosinophils Absolute: 0 10*3/uL (ref 0–0.7)
Eosinophils Relative: 0 %
HEMATOCRIT: 22.4 % — AB (ref 35.0–47.0)
HEMOGLOBIN: 7.2 g/dL — AB (ref 12.0–16.0)
LYMPHS ABS: 1.1 10*3/uL (ref 1.0–3.6)
Lymphocytes Relative: 5 %
MCH: 25.7 pg — AB (ref 26.0–34.0)
MCHC: 32.1 g/dL (ref 32.0–36.0)
MCV: 80.2 fL (ref 80.0–100.0)
MONOS PCT: 6 %
Monocytes Absolute: 1.2 10*3/uL — ABNORMAL HIGH (ref 0.2–0.9)
NEUTROS ABS: 19.9 10*3/uL — AB (ref 1.4–6.5)
Neutrophils Relative %: 88 %
Platelets: 353 10*3/uL (ref 150–440)
RBC: 2.79 MIL/uL — ABNORMAL LOW (ref 3.80–5.20)
RDW: 19.8 % — ABNORMAL HIGH (ref 11.5–14.5)
WBC: 22.3 10*3/uL — ABNORMAL HIGH (ref 3.6–11.0)

## 2018-06-01 LAB — URINALYSIS, COMPLETE (UACMP) WITH MICROSCOPIC
BACTERIA UA: NONE SEEN
Bilirubin Urine: NEGATIVE
Glucose, UA: NEGATIVE mg/dL
Hgb urine dipstick: NEGATIVE
KETONES UR: NEGATIVE mg/dL
LEUKOCYTES UA: NEGATIVE
Nitrite: NEGATIVE
PH: 7 (ref 5.0–8.0)
PROTEIN: NEGATIVE mg/dL
Specific Gravity, Urine: 1.014 (ref 1.005–1.030)
WBC, UA: NONE SEEN WBC/hpf (ref 0–5)

## 2018-06-01 LAB — COMPREHENSIVE METABOLIC PANEL
ALBUMIN: 3.1 g/dL — AB (ref 3.5–5.0)
ALK PHOS: 61 U/L (ref 38–126)
ALT: 10 U/L (ref 0–44)
AST: 17 U/L (ref 15–41)
Anion gap: 9 (ref 5–15)
BUN: 32 mg/dL — ABNORMAL HIGH (ref 8–23)
CO2: 27 mmol/L (ref 22–32)
Calcium: 8.3 mg/dL — ABNORMAL LOW (ref 8.9–10.3)
Chloride: 103 mmol/L (ref 98–111)
Creatinine, Ser: 0.78 mg/dL (ref 0.44–1.00)
GFR calc Af Amer: >60 mL/min (ref >60)
GFR calc non Af Amer: >60 mL/min (ref >60)
GLUCOSE: 97 mg/dL (ref 70–99)
POTASSIUM: 4 mmol/L (ref 3.5–5.1)
SODIUM: 139 mmol/L (ref 135–145)
Total Bilirubin: 0.6 mg/dL (ref 0.3–1.2)
Total Protein: 6.2 g/dL — ABNORMAL LOW (ref 6.5–8.1)

## 2018-06-01 LAB — MRSA PCR SCREENING: MRSA by PCR: NEGATIVE

## 2018-06-01 LAB — LACTIC ACID, PLASMA: Lactic Acid, Venous: 1 mmol/L (ref 0.5–1.9)

## 2018-06-01 LAB — PROTIME-INR
INR: 1.03
Prothrombin Time: 13.4 seconds (ref 11.4–15.2)

## 2018-06-01 MED ORDER — VANCOMYCIN HCL IN DEXTROSE 1-5 GM/200ML-% IV SOLN
INTRAVENOUS | Status: AC
  Filled 2018-06-01: qty 200

## 2018-06-01 MED ORDER — DULOXETINE HCL 30 MG PO CPEP
30.0000 mg | ORAL_CAPSULE | Freq: Every day | ORAL | Status: DC
  Administered 2018-06-01 – 2018-06-03 (×3): 30 mg via ORAL
  Filled 2018-06-01 (×4): qty 1

## 2018-06-01 MED ORDER — ACETAMINOPHEN 325 MG PO TABS
650.0000 mg | ORAL_TABLET | Freq: Four times a day (QID) | ORAL | Status: DC | PRN
  Administered 2018-06-01 – 2018-06-02 (×2): 650 mg via ORAL
  Filled 2018-06-01 (×2): qty 2

## 2018-06-01 MED ORDER — VANCOMYCIN HCL IN DEXTROSE 750-5 MG/150ML-% IV SOLN
750.0000 mg | INTRAVENOUS | Status: DC
  Administered 2018-06-02: 750 mg via INTRAVENOUS
  Filled 2018-06-01: qty 150

## 2018-06-01 MED ORDER — BUDESONIDE 0.25 MG/2ML IN SUSP
0.2500 mg | Freq: Two times a day (BID) | RESPIRATORY_TRACT | Status: DC
  Administered 2018-06-02 – 2018-06-05 (×6): 0.25 mg via RESPIRATORY_TRACT
  Filled 2018-06-01 (×8): qty 2

## 2018-06-01 MED ORDER — IPRATROPIUM-ALBUTEROL 0.5-2.5 (3) MG/3ML IN SOLN
3.0000 mL | RESPIRATORY_TRACT | Status: DC
Start: 2018-06-01 — End: 2018-06-04
  Administered 2018-06-01 – 2018-06-03 (×9): 3 mL via RESPIRATORY_TRACT
  Filled 2018-06-01 (×12): qty 3

## 2018-06-01 MED ORDER — NICOTINE 21 MG/24HR TD PT24
21.0000 mg | MEDICATED_PATCH | Freq: Every day | TRANSDERMAL | Status: DC
  Administered 2018-06-01 – 2018-06-03 (×3): 21 mg via TRANSDERMAL
  Filled 2018-06-01 (×5): qty 1

## 2018-06-01 MED ORDER — HEPARIN SODIUM (PORCINE) 5000 UNIT/ML IJ SOLN
5000.0000 [IU] | Freq: Three times a day (TID) | INTRAMUSCULAR | Status: DC
  Filled 2018-06-01: qty 1

## 2018-06-01 MED ORDER — FENTANYL 50 MCG/HR TD PT72
50.0000 ug | MEDICATED_PATCH | TRANSDERMAL | Status: DC
Start: 2018-06-03 — End: 2018-06-05
  Administered 2018-06-03: 50 ug via TRANSDERMAL
  Filled 2018-06-01: qty 1

## 2018-06-01 MED ORDER — CEFTRIAXONE SODIUM 1 G IJ SOLR
1.0000 g | Freq: Once | INTRAMUSCULAR | Status: AC
  Administered 2018-06-01: 1 g via INTRAVENOUS
  Filled 2018-06-01: qty 10

## 2018-06-01 MED ORDER — PANTOPRAZOLE SODIUM 40 MG PO TBEC
40.0000 mg | DELAYED_RELEASE_TABLET | Freq: Two times a day (BID) | ORAL | Status: DC
  Administered 2018-06-01 – 2018-06-05 (×9): 40 mg via ORAL
  Filled 2018-06-01 (×9): qty 1

## 2018-06-01 MED ORDER — TIOTROPIUM BROMIDE MONOHYDRATE 18 MCG IN CAPS
18.0000 ug | ORAL_CAPSULE | Freq: Every day | RESPIRATORY_TRACT | Status: DC
  Administered 2018-06-02: 18 ug via RESPIRATORY_TRACT
  Filled 2018-06-01: qty 5

## 2018-06-01 MED ORDER — IOHEXOL 300 MG/ML  SOLN
50.0000 mL | Freq: Once | INTRAMUSCULAR | Status: AC | PRN
  Administered 2018-06-01: 50 mL via INTRAVENOUS

## 2018-06-01 MED ORDER — SODIUM CHLORIDE 0.9 % IV SOLN
2.0000 g | Freq: Two times a day (BID) | INTRAVENOUS | Status: DC
  Administered 2018-06-01 – 2018-06-05 (×7): 2 g via INTRAVENOUS
  Filled 2018-06-01 (×10): qty 2

## 2018-06-01 MED ORDER — ALPRAZOLAM 0.25 MG PO TABS
0.2500 mg | ORAL_TABLET | Freq: Every evening | ORAL | Status: DC | PRN
  Administered 2018-06-02: 0.25 mg via ORAL
  Administered 2018-06-04: 0.5 mg via ORAL
  Filled 2018-06-01: qty 1
  Filled 2018-06-01: qty 2

## 2018-06-01 MED ORDER — VITAMIN B-12 1000 MCG PO TABS
1000.0000 ug | ORAL_TABLET | Freq: Every day | ORAL | Status: DC
  Administered 2018-06-01 – 2018-06-05 (×5): 1000 ug via ORAL
  Filled 2018-06-01 (×5): qty 1

## 2018-06-01 MED ORDER — FERROUS SULFATE 325 (65 FE) MG PO TABS
325.0000 mg | ORAL_TABLET | Freq: Two times a day (BID) | ORAL | Status: DC
  Administered 2018-06-01 – 2018-06-05 (×8): 325 mg via ORAL
  Filled 2018-06-01 (×8): qty 1

## 2018-06-01 MED ORDER — CYCLOBENZAPRINE HCL 10 MG PO TABS
5.0000 mg | ORAL_TABLET | Freq: Three times a day (TID) | ORAL | Status: DC | PRN
  Administered 2018-06-02: 5 mg via ORAL
  Filled 2018-06-01: qty 1

## 2018-06-01 MED ORDER — DONEPEZIL HCL 5 MG PO TABS
10.0000 mg | ORAL_TABLET | Freq: Every day | ORAL | Status: DC
  Administered 2018-06-01 – 2018-06-04 (×4): 10 mg via ORAL
  Filled 2018-06-01 (×5): qty 2

## 2018-06-01 MED ORDER — ACETAMINOPHEN 325 MG PO TABS: 650.0000 mg | ORAL_TABLET | Freq: Four times a day (QID) | ORAL | Status: DC | PRN

## 2018-06-01 MED ORDER — VANCOMYCIN HCL IN DEXTROSE 1-5 GM/200ML-% IV SOLN
1000.0000 mg | Freq: Once | INTRAVENOUS | Status: AC
  Administered 2018-06-01: 1000 mg via INTRAVENOUS

## 2018-06-01 MED ORDER — FAMOTIDINE 20 MG PO TABS
20.0000 mg | ORAL_TABLET | Freq: Every day | ORAL | Status: DC
  Administered 2018-06-01 – 2018-06-05 (×5): 20 mg via ORAL
  Filled 2018-06-01 (×5): qty 1

## 2018-06-01 MED ORDER — AMLODIPINE BESYLATE 5 MG PO TABS: 5.0000 mg | ORAL_TABLET | Freq: Every day | ORAL | Status: DC

## 2018-06-01 MED ORDER — SIMVASTATIN 10 MG PO TABS
10.0000 mg | ORAL_TABLET | Freq: Every day | ORAL | Status: DC
  Administered 2018-06-01 – 2018-06-04 (×4): 10 mg via ORAL
  Filled 2018-06-01 (×5): qty 1

## 2018-06-01 MED ORDER — CLOPIDOGREL BISULFATE 75 MG PO TABS
75.0000 mg | ORAL_TABLET | Freq: Every day | ORAL | Status: DC
  Administered 2018-06-01 – 2018-06-04 (×4): 75 mg via ORAL
  Filled 2018-06-01 (×4): qty 1

## 2018-06-01 MED ORDER — SODIUM CHLORIDE 0.9 % IV SOLN
INTRAVENOUS | Status: AC
  Administered 2018-06-01: 500 mg via INTRAVENOUS
  Filled 2018-06-01: qty 500

## 2018-06-01 MED ORDER — SODIUM CHLORIDE 0.9 % IV BOLUS
1000.0000 mL | Freq: Once | INTRAVENOUS | Status: AC
  Administered 2018-06-01: 1000 mL via INTRAVENOUS

## 2018-06-01 MED ORDER — SODIUM CHLORIDE 0.9 % IV SOLN
500.0000 mg | Freq: Once | INTRAVENOUS | Status: AC
  Administered 2018-06-01: 500 mg via INTRAVENOUS

## 2018-06-01 MED ORDER — DOCUSATE SODIUM 100 MG PO CAPS: 100.0000 mg | ORAL_CAPSULE | Freq: Two times a day (BID) | ORAL | Status: DC | PRN

## 2018-06-01 MED ORDER — METHYLPREDNISOLONE SODIUM SUCC 125 MG IJ SOLR
60.0000 mg | Freq: Four times a day (QID) | INTRAMUSCULAR | Status: DC
  Administered 2018-06-01 – 2018-06-02 (×4): 60 mg via INTRAVENOUS
  Filled 2018-06-01 (×4): qty 2

## 2018-06-01 MED ORDER — PREGABALIN 75 MG PO CAPS
150.0000 mg | ORAL_CAPSULE | Freq: Two times a day (BID) | ORAL | Status: DC
  Administered 2018-06-01 – 2018-06-05 (×8): 150 mg via ORAL
  Filled 2018-06-01 (×8): qty 2

## 2018-06-01 NOTE — ED Provider Notes (Signed)
[facility] Emergency Department Provider Note   ____________________________________________   First MD Initiated Contact with Patient 06/01/18 1156     (approximate)  I have reviewed the triage vital signs and the nursing notes.   HISTORY  Chief Complaint Cough and Fever    HPI Cheryl [name] is a 67 y.o. female who comes in from home with history of cough productive of green and yellow phlegm.  Last time she coughed anything up it was darker.  She had 103 fever at home earlier today.  She is been using much more oxygen than usual usually she does not use any oxygen at all although she can use up to 3 L as needed.  She was recently discharged from the hospital for pneumonia.  On room air her sats were 86 to 88%.  Patient also has some redness in the left lower leg and warmth there.  There is a note in the computer from 7/1 with a high white count at that time.  Chest x-ray at that time was read as negative.  Today's chest x-ray looks similar but there may be some haziness in both bases.  There is certainly crackles in both bases.   Past Medical History:  Diagnosis Date  . Allergy   . Anxiety   . Arthritis   . Aspiration pneumonitis ([place]) 11/24/2015  . Asthma   . Chronic pain   . COPD (chronic obstructive pulmonary disease) ([place])   . Coronary artery disease   . DVT (deep venous thrombosis) ([place])   . GERD (gastroesophageal reflux disease)   . Headache   . Hyperlipidemia   . Hypertension   . Kyphoscoliosis deformity of spine   . Migraines   . Neuropathy 2010  . Osteoporosis   . Oxygen deficiency   . Peripheral vascular disease ([place])   . Pneumonia   . Pneumonia 11/19/2015  . Pneumonia 10/2016  . Pneumonia    aspiration 2018- 4 times in last year  . Vitamin D deficiency     Patient Active Problem List   Diagnosis Date Noted  . Gastritis without bleeding   . Hypoxia 05/14/2018  . Generalized weakness 02/08/2018  . Rib fracture  09/19/2017  . Marijuana use 06/05/2017  . Pneumonia 04/29/2017  . Acute postoperative pain 04/14/2017  . Metabolic encephalopathy 16/08/9603  . Hypokalemia 12/23/2016  . Protein-calorie malnutrition, severe 11/06/2016  . Nausea with vomiting   . Lumbar facet syndrome (Location of Primary Source of Pain) (Bilateral) (L>R) 03/20/2016  . Lumbar spondylosis 03/20/2016  . Opiate use (160 MME/Day) 02/28/2016  . Anemia 02/28/2016  . Osteoarthrosis 12/18/2015  . Long term current use of anticoagulant therapy (Plavix) 12/18/2015  . Long term current use of opiate analgesic 12/04/2015  . Fibromyalgia 12/04/2015  . Dysphagia 11/24/2015  . Acute diastolic CHF (congestive heart failure) ([place]) 11/24/2015  . Leukocytosis 11/24/2015  . Abnormal mammogram of right breast 10/11/2015  . Dilated intrahepatic bile duct 10/11/2015  . Lumbar radicular pain (Bilateral) (L>R) (L4) 09/04/2015  . Chronic low back pain (Location of Primary Source of Pain) (Bilateral) (L>R) 09/04/2015  . Encounter for therapeutic drug level monitoring 09/04/2015  . Uncomplicated opioid dependence ([place]) 09/04/2015  . Chronic pain syndrome 09/04/2015  . Platelet inhibition due to Plavix 09/04/2015  . COPD (chronic obstructive pulmonary disease) ([place]) 07/31/2015  . Dementia 07/31/2015  . Airway hyperreactivity 07/03/2015  . Back pain, thoracic 07/03/2015  . Excessive falling 07/03/2015  . Alteration in bowel elimination: incontinence 07/03/2015  .  Insomnia 07/03/2015  . Decreased testosterone level 07/03/2015  . Leg weakness 07/03/2015  . Menopausal symptom 07/03/2015  . Migraine 07/03/2015  . Neuropathy ([place]) 07/03/2015  . Fecal occult blood test positive 07/03/2015  . OP (osteoporosis) 07/03/2015  . Panic disorder 07/03/2015  . Compulsive tobacco user syndrome 07/03/2015  . Urinary incontinence 07/03/2015  . Weight loss 07/03/2015  . Essential hypertension 06/06/2015  . GERD (gastroesophageal reflux disease)  06/06/2015  . Hyperlipemia 06/06/2015  . Peripheral nerve disease 04/13/2014  . Anxiety 02/10/2014  . Coronary artery disease 02/10/2014  . Peripheral vascular disease ([place]) 02/10/2014  . Vitamin D deficiency 10/16/2009    Past Surgical History:  Procedure Laterality Date  . ABDOMINAL HYSTERECTOMY  1975   Bilaterl Oophorectomy; [place] to IUD infection  . abdomnal aortic stent  05/30/2008   Dr. Quay Burow  . APPENDECTOMY    . APPENDECTOMY    . cardiac catherization  10/31/2009  . CERVICAL FUSION  C5 - 6/C6-7  . CHOLECYSTECTOMY  1972  . COLONOSCOPY WITH PROPOFOL N/A 07/27/2015   Procedure: COLONOSCOPY WITH PROPOFOL;  Surgeon: Hulen Luster, MD;  Location: [facility] ENDOSCOPY;  Service: Gastroenterology;  Laterality: N/A;  . ESOPHAGOGASTRODUODENOSCOPY (EGD) WITH PROPOFOL N/A 07/27/2015   Procedure: ESOPHAGOGASTRODUODENOSCOPY (EGD) WITH PROPOFOL;  Surgeon: Hulen Luster, MD;  Location: [facility] ENDOSCOPY;  Service: Gastroenterology;  Laterality: N/A;  . ESOPHAGOGASTRODUODENOSCOPY (EGD) WITH PROPOFOL N/A 05/19/2018   Procedure: ESOPHAGOGASTRODUODENOSCOPY (EGD) WITH PROPOFOL;  Surgeon: Lucilla Lame, MD;  Location[facility]COPY;  Service: Endoscopy;  Laterality: N/A;  . FOOT SURGERY Bilateral    5-6 years per patient  . SPINE SURGERY      Prior to Admission medications   Medication Sig Start Date End Date Taking? Authorizing Provider  acetaminophen (TYLENOL) 325 MG tablet Take 2 tablets (650 mg total) by mouth every 6 (six) hours as needed for mild pain (or Fever >/= 101). 12/25/16   Gladstone Lighter, MD  acetaminophen (TYLENOL) 325 MG tablet Take 2 tablets (650 mg total) by mouth every 6 (six) hours as needed for mild pain (or Fever >/= 101). 05/21/18   Loletha Grayer, MD  albuterol (PROVENTIL) (2.5 MG/3ML) 0.083% nebulizer solution Take 3 mLs (2.5 mg total) by nebulization every 4 (four) hours as needed for wheezing. 12/24/17   Birdie Sons, MD  alendronate (FOSAMAX) 70 MG tablet Take 1 tablet (70  mg total) by mouth every 7 (seven) days. Take with a full glass of water on an empty stomach. 01/23/18   Birdie Sons, MD  ALPRAZolam Duanne Moron) 0.5 MG tablet Take 0.5 tablets (0.25 mg total) by mouth at bedtime as needed for anxiety. 05/25/18   Birdie Sons, MD  amLODipine (NORVASC) 5 MG tablet Take 1 tablet (5 mg total) by mouth daily. 05/22/18   Loletha Grayer, MD  ciprofloxacin (CIPRO) 500 MG tablet Take 1 tablet (500 mg total) by mouth 2 (two) times daily. 05/21/18   Loletha Grayer, MD  clopidogrel (PLAVIX) 75 MG tablet Take 75 mg by mouth daily.    [provider]  cyclobenzaprine (FLEXERIL) 5 MG tablet Take 1 tablet (5 mg total) by mouth 3 (three) times daily as needed for muscle spasms. 02/02/18   Birdie Sons, MD  donepezil (ARICEPT) 10 MG tablet Take 10 mg by mouth at bedtime.    [provider]  DULoxetine (CYMBALTA) 30 MG capsule Take 1 capsule by mouth daily. 01/26/18   [provider]  estradiol (ESTRACE) 0.5 MG tablet Take 1 tablet (0.5  mg total) by mouth daily. (Take in place of estropipate) 02/26/18   Birdie Sons, MD  famotidine (PEPCID) 20 MG tablet Take 1 tablet (20 mg total) by mouth daily. 05/21/18 05/21/19  Loletha Grayer, MD  fentaNYL (DURAGESIC - DOSED MCG/HR) 50 MCG/HR Place 1 patch (50 mcg total) onto the skin every 3 (three) days. 07/21/18 08/20/18  Vevelyn Francois, NP  Fluticasone-Salmeterol (ADVAIR DISKUS) 250-50 MCG/DOSE AEPB inhale 1 dose by mouth twice a day 01/23/18   Birdie Sons, MD  [employer] 4 MG/0.1ML LIQD nasal spray kit Place 0.4 mg into the nose once.  09/11/17   [provider]  nicotine (NICODERM CQ - DOSED IN MG/24 HOURS) 21 mg/24hr patch Place 1 patch (21 mg total) onto the skin daily. Okay to substitute generic patch 05/22/18   Loletha Grayer, MD  ondansetron (ZOFRAN) 4 MG tablet Take 0.5-1 tablets (2-4 mg total) by mouth every 6 (six) hours as needed for nausea. 05/25/18   Birdie Sons, MD  pantoprazole  (PROTONIX) 40 MG tablet Take 1 tablet (40 mg total) by mouth 2 (two) times daily. 01/23/18   Birdie Sons, MD  potassium chloride (K-DUR) 10 MEQ tablet Take 1 tablet by mouth daily. 01/22/18   [provider]  pregabalin (LYRICA) 75 MG capsule Take 1 capsule (75 mg total) by mouth 2 (two) times daily. 05/21/18   Loletha Grayer, MD  simvastatin (ZOCOR) 10 MG tablet Take 10 mg by mouth at bedtime.     [provider]  tiotropium (SPIRIVA HANDIHALER) 18 MCG inhalation capsule inhale the contents of one capsule in the handihaler once daily 01/23/18   Birdie Sons, MD  traZODone (DESYREL) 50 MG tablet Take 1-2 tablets (50-100 mg total) by mouth at bedtime. 05/25/18   Birdie Sons, MD  triamcinolone ointment (KENALOG) 0.5 % Apply to lesions on hands twice a day as needed 10/24/16   Birdie Sons, MD  vitamin B-12 (CYANOCOBALAMIN) 1000 MCG tablet Take 1 tablet (1,000 mcg total) by mouth daily. 05/21/18   Loletha Grayer, MD    Allergies Percocet [oxycodone-acetaminophen]; Aspirin; Codeine; Propoxyphene; and Sulfa antibiotics  Family History  Problem Relation Age of Onset  . Cancer Mother   . Arthritis Mother   . Heart disease Mother   . Diabetes Mother        mellitus, type 2  . Heart disease Father   . Diabetes Sister   . Cancer Brother   . Cancer Brother        lung  . Diabetes Brother     Social History Social History   Tobacco Use  . Smoking status: Current Every Day Smoker    Packs/day: 1.00    Years: 50.00    Pack years: 50.00    Types: Cigarettes  . Smokeless tobacco: Never Used  . Tobacco comment: Previously smoked 2 ppd  Substance Use Topics  . Alcohol use: No    Alcohol/week: 0.0 oz  . Drug use: No    Review of Systems  Constitutional: fever/chills Eyes: No visual changes. ENT: No sore throat. Cardiovascular: Denies chest pain. Respiratory:  shortness of breath. Gastrointestinal: No abdominal pain.  No nausea, no vomiting.  No  diarrhea.  No constipation. Genitourinary: Negative for dysuria. Musculoskeletal: Negative for back pain. Skin: Negative for rash. Neurological: Negative for headaches, focal weakness  ____________________________________________   PHYSICAL EXAM:  VITAL SIGNS: ED Triage Vitals  Enc Vitals Group     BP 06/01/18 1144 (!) 93/53  Pulse Rate 06/01/18 1144 (!) 113     Resp 06/01/18 1144 (!) 24     Temp 06/01/18 1144 (!) 100.7 F (38.2 C)     Temp Source 06/01/18 1144 Oral     SpO2 06/01/18 1144 (!) 88 %     Weight 06/01/18 1150 90 lb (40.8 kg)     Height 06/01/18 1150 4' 10" (1.473 m)     Head Circumference --      Peak Flow --      Pain Score 06/01/18 1150 3     Pain Loc --      Pain Edu? --      Excl. in [place]? --     Constitutional: Alert and oriented. Well appearing and in no acute distress. Eyes: Conjunctivae are normal. PERRL. EOMI. Head: Atraumatic. Nose: No congestion/rhinnorhea. Mouth/Throat: Mucous membranes are moist.  Oropharynx non-erythematous. Neck: No stridor. Cardiovascular: Normal rate, regular rhythm. Grossly normal heart sounds.  Good peripheral circulation. Respiratory: Normal respiratory effort.  No retractions. Lungs crackles in bases. Gastrointestinal: Soft and nontender. No distention. No abdominal bruits. No CVA tenderness. Musculoskeletal: No lower extremity tenderness nor edema.  No joint effusions. Neurologic:  Normal speech and language. No gross focal neurologic deficits are appreciated. No gait instability. Skin:  Skin is warm, dry and intact.reddness ans warmth  In ll leg Psychiatric: Mood and affect are normal. Speech and behavior are normal.  ____________________________________________   LABS (all labs ordered are listed, but only abnormal results are displayed)  Labs Reviewed  COMPREHENSIVE METABOLIC PANEL - Abnormal; Notable for the following components:      Result Value   BUN 32 (*)    Calcium 8.3 (*)    Total Protein 6.2 (*)      Albumin 3.1 (*)    All other components within normal limits  CBC WITH DIFFERENTIAL/PLATELET - Abnormal; Notable for the following components:   WBC 22.3 (*)    RBC 2.79 (*)    Hemoglobin 7.2 (*)    HCT 22.4 (*)    MCH 25.7 (*)    RDW 19.8 (*)    Neutro Abs 19.9 (*)    Monocytes Absolute 1.2 (*)    Basophils Absolute 0.2 (*)    All other components within normal limits  BLOOD GAS, VENOUS - Abnormal; Notable for the following components:   Bicarbonate 28.5 (*)    Acid-Base Excess 3.3 (*)    All other components within normal limits  CULTURE, BLOOD (ROUTINE X 2)  CULTURE, BLOOD (ROUTINE X 2)  LACTIC ACID, PLASMA  PROTIME-INR  LACTIC ACID, PLASMA  URINALYSIS, COMPLETE (UACMP) WITH MICROSCOPIC  URINALYSIS, ROUTINE W REFLEX MICROSCOPIC   ____________________________________________  EKG  EKG read and interpreted by me shows sinus tachycardia rate 102 normal axis nonspecific ST-T wave changes ____________________________________________  RADIOLOGY  ED MD interpretation: Chest x-ray read by radiology read viewed by me looks similar to one from the first.  Notably she had a white count of 22,000 on the first as well as a cough then.  Her symptoms of worsening chest x-ray looks similar.  Symptoms are consistent with pneumonia.  Official radiology report(s): Dg Chest 2 View  Result Date: 06/01/2018 CLINICAL DATA:  Fever and productive cough. History of asthma/COPD, CAD and hypertension. EXAM: CHEST - 2 VIEW COMPARISON:  05/25/2018 and 05/16/2018 radiographs. FINDINGS: Persistent patient rotation to the right. The heart size and mediastinal contours are stable. There is overall improved aeration of the right middle lobe and lingula. Chronic interstitial  prominence is otherwise unchanged. There is no edema, confluent airspace opacity, pleural effusion or pneumothorax. Abdominal aortic stent graft is noted. No acute osseous findings are seen. IMPRESSION: Overall improved aeration of both  lung bases with otherwise stable generalized interstitial prominence. No acute cardiopulmonary process. Electronically Signed   By: Richardean Sale M.D.   On: 06/01/2018 12:19    ____________________________________________   PROCEDURES  Procedure(s) performed:   Procedures  Critical Care performed:  ____________________________________________   INITIAL IMPRESSION / ASSESSMENT AND PLAN / ED COURSE  Patient says she feels okay just needs more oxygen than usual she does admit to coughing up green and yellow phlegm as I noted.  Patient looks well however her numbers heart rate blood pressure pulse ox white cell count etc. do not looks are good.  We will have to get her in the hospital.  She does meet sepsis criteria.     ____________________________________________   FINAL CLINICAL IMPRESSION(S) / ED DIAGNOSES  Final diagnoses:  Sepsis, due to unspecified organism [facility])  Community acquired pneumonia, unspecified laterality  Cellulitis of left lower extremity     ED Discharge Orders    None       Note:  This document was prepared using Dragon voice recognition software and may include unintentional dictation errors.    Nena Polio, MD 06/01/18 1310

## 2018-06-01 NOTE — Progress Notes (Signed)
Pharmacy Antibiotic Note  Cheryl [name] is a 67 y.o. female admitted on 06/01/2018 with sepsis.  Pharmacy has been consulted for vancomycin and cefepime dosing. She was recently discharged after being treated here for pneumonia, primarily with Unasyn.  Plan: The first dose of vancomycin was 1000mg  given in the ED. We will follow with vancomycin 750mg  IV every 24 hours with a stacked dose interval calculated at 19 hours.  Goal trough 15-20 mcg/mL. Calculated levels at steady state are calculated as 48/19 mcg/mL, Vd: 29L, T1/2: 17h, K: 0.04. A vancomycin trough is scheduled prior to the 4th dose. Appropriate cefepime dose is 2 grams IV every 12 hours.  Height: 4\' 10"  (147.3 cm) Weight: 90 lb (40.8 kg) IBW/kg (Calculated) : 40.9  Temp (24hrs), Avg:100.7 F (38.2 C), Min:100.7 F (38.2 C), Max:100.7 F (38.2 C)  Recent Labs  Lab 05/25/18 1830 06/01/18 1219  WBC 22.1* 22.3*  CREATININE 0.96 0.78  LATICACIDVEN  --  1.0    Estimated Creatinine Clearance: 42.7 mL/min (by C-G formula based on SCr of 0.78 mg/dL).    Allergies  Allergen Reactions  . Percocet [Oxycodone-Acetaminophen] Hives and Rash  . Aspirin Nausea And Vomiting and Other (See Comments)    Reaction:  GI upset   . Codeine Nausea And Vomiting, Other (See Comments) and Nausea Only    Reaction:  GI upset   . Propoxyphene Other (See Comments) and Nausea Only    GI upset Reaction:  GI upset   . Sulfa Antibiotics Rash and Other (See Comments)    Reaction:  GI upset     Antimicrobials this admission: Azithromycin 7/8 Ceftriaxone 7/8 Vancomycin 7/8>> Cefepime 7/8 >>  Microbiology results: 7/8 BCx: pending 7/9 MRSA PCR:   Thank you for allowing pharmacy to be a part of this patient's care.  Cheryl [name] Cheryl [name], PharmD 06/01/2018 2:43 PM

## 2018-06-01 NOTE — Telephone Encounter (Signed)
Home health nurse Renee RivalSherry Miles called during her visit with the patient. She reports that patient had a fever of 103 this morning around 5 or 6am. Her temperature has come down to 100.1. Cordelia PenSherry states that patient is lethargic and not her normal self. Cordelia PenSherry reports some crackles in her chest during ausculation. I spoke with Dr. Sherrie MustacheFisher who recommend that patient go to the ER for evaluation. I advised patient and her husband and they verbally voiced understanding, and agreed to go to the ER.

## 2018-06-01 NOTE — ED Triage Notes (Signed)
Pt to ED from c/o fever of 103 at home, productive green cough.  Pt wears oxygen as needed but as been on 3L at home more often recently.  Recently discharged from hospital after pneumonia.

## 2018-06-01 NOTE — Progress Notes (Signed)
CODE SEPSIS - PHARMACY COMMUNICATION  **[facility] Antibiotics should be administered within 1 hour of Sepsis diagnosis**  Time Code Sepsis Called/Page Received: 1158  Antibiotics Ordered: Rocephin, azithromycin, vancomycin  Time of 1st antibiotic administration: 1306  Additional action taken by pharmacy: contacted provider   If necessary, Name of Provider/Nurse Contacted: Dr Joaquim LaiMalinda    Sanah Kraska D Jaid Quirion ,PharmD Clinical Pharmacist  06/01/2018  1:21 PM

## 2018-06-01 NOTE — Progress Notes (Signed)
Family Meeting Note  Advance Directive:yes  Today a meeting took place with the Patient and spouse.  The following clinical team members were present during this meeting:MD  The following were discussed:Patient's diagnosis: Pneumonia, Respi failure, Patient's progosis: Unable to determine and Goals for treatment: Full Code  Pt said" she want to have everything done and a trial for short time, but if no recvovery- she will not like to Stay on machines support."  Additional follow-up to be provided: Pulmonary   Time spent during discussion:20 minutes  Altamese DillingVaibhavkumar Tery Hoeger, MD

## 2018-06-01 NOTE — H&P (Signed)
[facility] [place] NAME: Cheryl [name]    MR#:  [id number]  DATE OF BIRTH:  October 30, 1951  DATE OF ADMISSION:  06/01/2018  PRIMARY CARE PHYSICIAN: Birdie Sons, MD   REQUESTING/REFERRING PHYSICIAN: malinda   CHIEF COMPLAINT:   Chief Complaint  Patient presents with  . Cough  . Fever    HISTORY OF PRESENT ILLNESS: Cheryl [name]  is a 67 y.o. female with a known history of COPD, CAD, GERD, DVT, HLD, Htn, PVD, Anxiety- was admitted to hospital last week for pneumonia, had not been eating, so EGD was done which was normal and she was discharged with oral Abx according to culture result. Since discharge last week, Finished Abx course, but cont to feel SOB and had fever with yellowish sputum since yesterday, so came back Noted Hypoxic, tachycardiac, tachypneic.  PAST MEDICAL HISTORY:   Past Medical History:  Diagnosis Date  . Allergy   . Anxiety   . Arthritis   . Aspiration pneumonitis ([place]) 11/24/2015  . Asthma   . Chronic pain   . COPD (chronic obstructive pulmonary disease) ([place])   . Coronary artery disease   . DVT (deep venous thrombosis) ([place])   . GERD (gastroesophageal reflux disease)   . Headache   . Hyperlipidemia   . Hypertension   . Kyphoscoliosis deformity of spine   . Migraines   . Neuropathy 2010  . Osteoporosis   . Oxygen deficiency   . Peripheral vascular disease ([place])   . Pneumonia   . Pneumonia 11/19/2015  . Pneumonia 10/2016  . Pneumonia    aspiration 2018- 4 times in last year  . Vitamin D deficiency     PAST SURGICAL HISTORY:  Past Surgical History:  Procedure Laterality Date  . ABDOMINAL HYSTERECTOMY  1975   Bilaterl Oophorectomy; [place] to IUD infection  . abdomnal aortic stent  05/30/2008   Dr. Quay Burow  . APPENDECTOMY    . APPENDECTOMY    . cardiac catherization  10/31/2009  . CERVICAL FUSION  C5 - 6/C6-7  . CHOLECYSTECTOMY  1972  . COLONOSCOPY WITH PROPOFOL N/A 07/27/2015   Procedure:  COLONOSCOPY WITH PROPOFOL;  Surgeon: Hulen Luster, MD;  Location: [facility] ENDOSCOPY;  Service: Gastroenterology;  Laterality: N/A;  . ESOPHAGOGASTRODUODENOSCOPY (EGD) WITH PROPOFOL N/A 07/27/2015   Procedure: ESOPHAGOGASTRODUODENOSCOPY (EGD) WITH PROPOFOL;  Surgeon: Hulen Luster, MD;  Location: [facility] ENDOSCOPY;  Service: Gastroenterology;  Laterality: N/A;  . ESOPHAGOGASTRODUODENOSCOPY (EGD) WITH PROPOFOL N/A 05/19/2018   Procedure: ESOPHAGOGASTRODUODENOSCOPY (EGD) WITH PROPOFOL;  Surgeon: Lucilla Lame, MD;  Location: [facility] ENDOSCOPY;  Service: Endoscopy;  Laterality: N/A;  . FOOT SURGERY Bilateral    5-6 years per patient  . SPINE SURGERY      SOCIAL HISTORY:  Social History   Tobacco Use  . Smoking status: Current Every Day Smoker    Packs/day: 1.00    Years: 50.00    Pack years: 50.00    Types: Cigarettes  . Smokeless tobacco: Never Used  . Tobacco comment: Previously smoked 2 ppd  Substance Use Topics  . Alcohol use: No    Alcohol/week: 0.0 oz    FAMILY HISTORY:  Family History  Problem Relation Age of Onset  . Cancer Mother   . Arthritis Mother   . Heart disease Mother   . Diabetes Mother        mellitus, type 2  . Heart disease Father   . Diabetes Sister   . Cancer Brother   .  Cancer Brother        lung  . Diabetes Brother     DRUG ALLERGIES:  Allergies  Allergen Reactions  . Percocet [Oxycodone-Acetaminophen] Hives and Rash  . Aspirin Nausea And Vomiting and Other (See Comments)    Reaction:  GI upset   . Codeine Nausea And Vomiting, Other (See Comments) and Nausea Only    Reaction:  GI upset   . Propoxyphene Other (See Comments) and Nausea Only    GI upset Reaction:  GI upset   . Sulfa Antibiotics Rash and Other (See Comments)    Reaction:  GI upset     REVIEW OF SYSTEMS:   CONSTITUTIONAL: No fever, fatigue or weakness.  EYES: No blurred or double vision.  EARS, NOSE, AND THROAT: No tinnitus or ear pain.  RESPIRATORY: Have cough, shortness of breath, wheezing ,  no hemoptysis.  CARDIOVASCULAR: no chest pain, orthopnea, edema.  GASTROINTESTINAL: No nausea, vomiting, diarrhea or abdominal pain.  GENITOURINARY: No dysuria, hematuria.  ENDOCRINE: No polyuria, nocturia,  HEMATOLOGY: No anemia, easy bruising or bleeding SKIN: No rash or lesion. MUSCULOSKELETAL: No joint pain or arthritis.   NEUROLOGIC: No tingling, numbness, weakness.  PSYCHIATRY: No anxiety or depression.   MEDICATIONS AT HOME:  Prior to Admission medications   Medication Sig Start Date End Date Taking? Authorizing Provider  acetaminophen (TYLENOL) 325 MG tablet Take 2 tablets (650 mg total) by mouth every 6 (six) hours as needed for mild pain (or Fever >/= 101). 12/25/16  Yes Gladstone Lighter, MD  acetaminophen (TYLENOL) 325 MG tablet Take 2 tablets (650 mg total) by mouth every 6 (six) hours as needed for mild pain (or Fever >/= 101). 05/21/18  Yes Wieting, Richard, MD  albuterol (PROVENTIL) (2.5 MG/3ML) 0.083% nebulizer solution Take 3 mLs (2.5 mg total) by nebulization every 4 (four) hours as needed for wheezing. 12/24/17  Yes Birdie Sons, MD  alendronate (FOSAMAX) 70 MG tablet Take 1 tablet (70 mg total) by mouth every 7 (seven) days. Take with a full glass of water on an empty stomach. 01/23/18  Yes Birdie Sons, MD  ALPRAZolam Duanne Moron) 0.5 MG tablet Take 0.5 tablets (0.25 mg total) by mouth at bedtime as needed for anxiety. Patient taking differently: Take 0.25-0.5 mg by mouth at bedtime as needed for anxiety.  05/25/18  Yes Birdie Sons, MD  clopidogrel (PLAVIX) 75 MG tablet Take 75 mg by mouth daily.   Yes [provider]  cyclobenzaprine (FLEXERIL) 5 MG tablet Take 1 tablet (5 mg total) by mouth 3 (three) times daily as needed for muscle spasms. 02/02/18  Yes Birdie Sons, MD  donepezil (ARICEPT) 10 MG tablet Take 10 mg by mouth at bedtime.   Yes [provider]  DULoxetine (CYMBALTA) 30 MG capsule Take 1 capsule by mouth daily. 01/26/18  Yes [provider]  estradiol (ESTRACE) 0.5 MG tablet Take 1 tablet (0.5 mg total) by mouth daily. (Take in place of estropipate) Patient taking differently: Take 0.5 mg by mouth every other day. (Take in place of estropipate) 02/26/18  Yes Fisher, Kirstie Peri, MD  famotidine (PEPCID) 20 MG tablet Take 1 tablet (20 mg total) by mouth daily. 05/21/18 05/21/19 Yes Wieting, Richard, MD  fentaNYL (DURAGESIC - DOSED MCG/HR) 50 MCG/HR Place 1 patch (50 mcg total) onto the skin every 3 (three) days. 07/21/18 08/20/18 Yes Vevelyn Francois, NP  Fluticasone-Salmeterol (ADVAIR DISKUS) 250-50 MCG/DOSE AEPB inhale 1 dose by mouth twice a day 01/23/18  Yes Fisher,  Kirstie Peri, MD  NARCAN 4 MG/0.1ML LIQD nasal spray kit Place 0.4 mg into the nose once.  09/11/17  Yes [provider]  ondansetron (ZOFRAN) 4 MG tablet Take 0.5-1 tablets (2-4 mg total) by mouth every 6 (six) hours as needed for nausea. Patient taking differently: Take 4 mg by mouth daily.  05/25/18  Yes Birdie Sons, MD  pantoprazole (PROTONIX) 40 MG tablet Take 1 tablet (40 mg total) by mouth 2 (two) times daily. 01/23/18  Yes Birdie Sons, MD  pregabalin (LYRICA) 150 MG capsule Take 150 mg by mouth 2 (two) times daily.   Yes [provider]  simvastatin (ZOCOR) 10 MG tablet Take 10 mg by mouth at bedtime.    Yes [provider]  tiotropium (SPIRIVA HANDIHALER) 18 MCG inhalation capsule inhale the contents of one capsule in the handihaler once daily 01/23/18  Yes Fisher, Kirstie Peri, MD  triamcinolone ointment (KENALOG) 0.5 % Apply to lesions on hands twice a day as needed 10/24/16  Yes Fisher, Kirstie Peri, MD  vitamin B-12 (CYANOCOBALAMIN) 1000 MCG tablet Take 1 tablet (1,000 mcg total) by mouth daily. 05/21/18  Yes Wieting, Richard, MD  amLODipine (NORVASC) 5 MG tablet Take 1 tablet (5 mg total) by mouth daily. Patient not taking: Reported on 06/01/2018 05/22/18   Loletha Grayer, MD  nicotine (NICODERM CQ - DOSED IN MG/24 HOURS) 21 mg/24hr patch  Place 1 patch (21 mg total) onto the skin daily. Okay to substitute generic patch Patient not taking: Reported on 06/01/2018 05/22/18   Loletha Grayer, MD  pregabalin (LYRICA) 75 MG capsule Take 1 capsule (75 mg total) by mouth 2 (two) times daily. Patient not taking: Reported on 06/01/2018 05/21/18   Loletha Grayer, MD  traZODone (DESYREL) 50 MG tablet Take 1-2 tablets (50-100 mg total) by mouth at bedtime. Patient not taking: Reported on 06/01/2018 05/25/18   Birdie Sons, MD      PHYSICAL EXAMINATION:   VITAL SIGNS: Blood pressure (!) 93/53, pulse (!) 113, temperature (!) 100.7 F (38.2 C), temperature source Oral, resp. rate (!) 24, height '4\' 10"'$  (1.473 m), weight 40.8 kg (90 lb), SpO2 (!) 88 %.  GENERAL:  67 y.o.-year-old patient lying in the bed with no acute distress.  EYES: Pupils equal, round, reactive to light and accommodation. No scleral icterus. Extraocular muscles intact.  HEENT: Head atraumatic, normocephalic. Oropharynx and nasopharynx clear.  NECK:  Supple, no jugular venous distention. No thyroid enlargement, no tenderness.  LUNGS: Normal breath sounds bilaterally, some wheezing, no rales,rhonchi or crepitation. Positive for use of accessory muscles of respiration. On nasal canula oxygen. CARDIOVASCULAR: S1, S2 normal. No murmurs, rubs, or gallops.  ABDOMEN: Soft, nontender, nondistended. Bowel sounds present. No organomegaly or mass.  EXTREMITIES: No pedal edema, cyanosis, or clubbing.  NEUROLOGIC: Cranial nerves II through XII are intact. Muscle strength 5/5 in all extremities. Sensation intact. Gait not checked.  PSYCHIATRIC: The patient is alert and oriented x 3.  SKIN: No obvious rash, lesion, or ulcer.   LABORATORY PANEL:   CBC Recent Labs  Lab 05/25/18 1830 06/01/18 1219  WBC 22.1* 22.3*  HGB 8.4* 7.2*  HCT 25.4* 22.4*  PLT 440 353  MCV 82.3 80.2  MCH 27.3 25.7*  MCHC 33.2 32.1  RDW 19.7* 19.8*  LYMPHSABS  --  1.1  MONOABS  --  1.2*  EOSABS  --  0.0   BASOSABS  --  0.2*   ------------------------------------------------------------------------------------------------------------------  Chemistries  Recent Labs  Lab 05/25/18 1830 06/01/18  1219  NA 140 139  K 4.7 4.0  CL 97* 103  CO2 32 27  GLUCOSE 84 97  BUN 13 32*  CREATININE 0.96 0.78  CALCIUM 8.1* 8.3*  AST 16 17  ALT 8 10  ALKPHOS 64 61  BILITOT 0.7 0.6   ------------------------------------------------------------------------------------------------------------------ estimated creatinine clearance is 42.7 mL/min (by C-G formula based on SCr of 0.78 mg/dL). ------------------------------------------------------------------------------------------------------------------ No results for input(s): TSH, T4TOTAL, T3FREE, THYROIDAB in the last 72 hours.  Invalid input(s): FREET3   Coagulation profile Recent Labs  Lab 06/01/18 1219  INR 1.03   ------------------------------------------------------------------------------------------------------------------- No results for input(s): DDIMER in the last 72 hours. -------------------------------------------------------------------------------------------------------------------  Cardiac Enzymes No results for input(s): CKMB, TROPONINI, MYOGLOBIN in the last 168 hours.  Invalid input(s): CK ------------------------------------------------------------------------------------------------------------------ Invalid input(s): POCBNP  ---------------------------------------------------------------------------------------------------------------  Urinalysis    Component Value Date/Time   COLORURINE STRAW (A) 06/01/2018 1400   APPEARANCEUR CLEAR (A) 06/01/2018 1400   APPEARANCEUR Clear 08/19/2014 2200   LABSPEC 1.014 06/01/2018 1400   LABSPEC 1.025 08/19/2014 2200   PHURINE 7.0 06/01/2018 1400   GLUCOSEU NEGATIVE 06/01/2018 1400   GLUCOSEU Negative 08/19/2014 2200   HGBUR NEGATIVE 06/01/2018 1400   BILIRUBINUR  NEGATIVE 06/01/2018 1400   BILIRUBINUR Neg 05/25/2018 1638   BILIRUBINUR Negative 08/19/2014 2200   KETONESUR NEGATIVE 06/01/2018 1400   PROTEINUR NEGATIVE 06/01/2018 1400   UROBILINOGEN 0.2 05/25/2018 1638   NITRITE NEGATIVE 06/01/2018 1400   LEUKOCYTESUR NEGATIVE 06/01/2018 1400   LEUKOCYTESUR Negative 08/19/2014 2200     RADIOLOGY: Dg Chest 2 View  Result Date: 06/01/2018 CLINICAL DATA:  Fever and productive cough. History of asthma/COPD, CAD and hypertension. EXAM: CHEST - 2 VIEW COMPARISON:  05/25/2018 and 05/16/2018 radiographs. FINDINGS: Persistent patient rotation to the right. The heart size and mediastinal contours are stable. There is overall improved aeration of the right middle lobe and lingula. Chronic interstitial prominence is otherwise unchanged. There is no edema, confluent airspace opacity, pleural effusion or pneumothorax. Abdominal aortic stent graft is noted. No acute osseous findings are seen. IMPRESSION: Overall improved aeration of both lung bases with otherwise stable generalized interstitial prominence. No acute cardiopulmonary process. Electronically Signed   By: Richardean Sale M.D.   On: 06/01/2018 12:19   Ct Chest W Contrast  Result Date: 06/01/2018 CLINICAL DATA:  67 year old presenting with acute onset of fever up to 103 degrees Fahrenheit associated with a productive cough. Patient recently discharged from the hospital with a diagnosis of pneumonia. Hypoxemia upon arrival to the emergency department with oxygen saturation 88% on room air. Crackles in the lung bases on clinical examination. EXAM: CT CHEST WITH CONTRAST TECHNIQUE: Multidetector CT imaging of the chest was performed during intravenous contrast administration. CONTRAST:  34m OMNIPAQUE IOHEXOL 300 MG/ML IV. COMPARISON:  CT chest 06/03/2016 and earlier. Chest x-rays earlier same day, 05/25/2018 and earlier. FINDINGS: Cardiovascular: Heart mildly enlarged, unchanged. Moderate three-vessel coronary  atherosclerosis. No pericardial effusion. Moderate to severe atherosclerosis involving the thoracic and UPPER abdominal aorta without evidence of aneurysm. Atherosclerosis at the origin of the LEFT subclavian artery without evidence of hemodynamically significant stenosis. Atherosclerosis at the origin of the celiac and superior mesenteric arteries without evidence of hemodynamically significant stenosis. Mediastinum/Nodes: Scattered upper normal sized mediastinal lymph nodes, the largest in the subcarinal region (station 7) measuring approximately 1.6 x 1.7 cm, unchanged from prior examinations. No new or enlarging lymphadenopathy. Large hiatal hernia. Normal appearing esophagus. Normal-appearing thyroid gland. Lungs/Pleura: Marked hyperinflation with emphysematous changes throughout both lungs. Peripheral blebs in the deep  LATERAL RIGHT lower lobe as noted previously. Airspace opacities throughout both lungs with a nodular acinar pattern, involving the RIGHT upper lobe, RIGHT lower lobe and LEFT lower lobe. The greatest involvement is in the RIGHT lower lobe. Linear scarring in the lingula, RIGHT middle lobe and both lower lobes, unchanged. No pleural effusions. Central airways patent with marked central bronchial wall thickening. Upper Abdomen: Possible hepatomegaly, though the liver is incompletely imaged. Anatomic variant in that the LEFT lobe of the liver extends well across the midline into the LEFT UPPER quadrant. Apart from the large hiatal hernia, no significant findings elsewhere in the visualized upper abdomen. Musculoskeletal: Severe degenerative disc disease and spondylosis at C6-7, incompletely imaged. Osseous demineralization. Mild to moderate degenerative disc disease and spondylosis involving the mid and LOWER thoracic spine. Remote fractures involving the RIGHT POSTERIOR eighth rib and the LEFT LATERAL ninth rib. No acute findings. IMPRESSION: 1. Patchy pneumonia involving the BILATERAL lower  lobes and the RIGHT upper lobe, with the greatest involvement in the RIGHT lower lobe. Marked central peribronchial thickening indicating asthma and/or bronchitis. 2. Stable mild reactive mediastinal lymphadenopathy. No new or enlarging lymphadenopathy. 3. Large hiatal hernia. 4. Possible hepatomegaly, though the liver is incompletely imaged. 5. Stable mild cardiomegaly. Moderate three-vessel coronary atherosclerosis. Aortic Atherosclerosis (ICD10-I70.0) and Emphysema (ICD10-J43.9). Electronically Signed   By: Evangeline Dakin M.D.   On: 06/01/2018 14:11    EKG: Orders placed or performed during the hospital encounter of 06/01/18  . ED EKG 12-Lead  . ED EKG 12-Lead  . EKG 12-Lead  . EKG 12-Lead  . EKG 12-Lead  . EKG 12-Lead    IMPRESSION AND PLAN:  * Ac on ch respi failure with hypoxia   HCAP    COPD exacerbation    IV Vanc + cefepime, Follow cx, send sputum cx, MRSA PCR and respi panel.   IV and inhaled steroids, nebs, spiriva   Pulm consult  * Chronic anemia   Will give iron.  * Hypertension   Cont amlodipine  * CAD, PVD   ASA+ Plavix, Statin.  All the records are reviewed and case discussed with ED provider. Management plans discussed with the patient, family and they are in agreement.  CODE STATUS: Full code. Code Status History    Date Active Date Inactive Code Status Order ID Comments User Context   05/14/2018 1351 05/21/2018 1558 Partial Code [id number]  Saundra Shelling, MD ED   02/08/2018 1835 02/09/2018 1920 Full Code [id number]  Vaughan Basta, MD Inpatient   02/05/2018 2025 02/07/2018 2119 Full Code [id number]  Hillary Bow, MD ED   04/29/2017 1314 05/02/2017 2148 Full Code [id number]  Epifanio Lesches, MD ED   12/23/2016 1745 12/25/2016 1849 Full Code [id number]  Theodoro Grist, MD ED   11/06/2016 0900 11/07/2016 1853 Full Code [id number]  Max Sane, MD Inpatient   11/05/2016 1401 11/06/2016 0900 Partial Code [id number]  Bettey Costa, MD Inpatient   06/03/2016 0253  06/05/2016 1945 Full Code [id number]  Quintella Baton, MD Inpatient   11/19/2015 1218 11/24/2015 1624 Full Code [id number]  Demetrios Loll, MD Inpatient   11/09/2015 2302 11/13/2015 1851 Full Code [id number]  Lytle Butte, MD ED   10/17/2015 2226 10/22/2015 1303 Full Code [id number]  Fritzi Mandes, MD Inpatient   10/09/2015 1738 10/12/2015 1934 Full Code [id number]  Demetrios Loll, MD Inpatient   08/01/2015 0008 08/03/2015 1454 Full Code [id number]  Lance Coon, MD Inpatient    Questions for Most Recent Historical Code Status (Order [id number])    Question  Answer Comment   In the event of cardiac or respiratory ARREST: Initiate Code Blue, Call Rapid Response Yes    In the event of cardiac or respiratory ARREST: Perform CPR Yes    In the event of cardiac or respiratory ARREST: Perform Intubation/Mechanical Ventilation No    In the event of cardiac or respiratory ARREST: Use NIPPV/BiPAp only if indicated Yes    In the event of cardiac or respiratory ARREST: Administer ACLS medications if indicated Yes    In the event of cardiac or respiratory ARREST: Perform Defibrillation or Cardioversion if indicated Yes        TOTAL TIME TAKING CARE OF THIS PATIENT: 45 minutes.   Husband and grand daughter in room.  Vaughan Basta M.D on 06/01/2018   Between 7am to 6pm - Pager - [phone]  After 6pm go to [email or web address] - password [email or web address] [facility]  Office  [phone]  CC: Primary care physician; Birdie Sons, MD   Note: This dictation was prepared with Dragon dictation along with smaller phrase technology. Any transcriptional errors that result from this process are unintentional.

## 2018-06-01 NOTE — ED Notes (Signed)
Patient transported to X-ray 

## 2018-06-01 NOTE — ED Notes (Signed)
Pt O2 sat 85-88 on RA. Stephen,RN made aware. Pt placed on 3L of O2.

## 2018-06-02 ENCOUNTER — Inpatient Hospital Stay: Payer: Medicare HMO | Admitting: Family Medicine

## 2018-06-02 LAB — CBC
HEMATOCRIT: 19.3 % — AB (ref 35.0–47.0)
Hemoglobin: 6.3 g/dL — ABNORMAL LOW (ref 12.0–16.0)
MCH: 26.2 pg (ref 26.0–34.0)
MCHC: 32.4 g/dL (ref 32.0–36.0)
MCV: 80.8 fL (ref 80.0–100.0)
PLATELETS: 287 10*3/uL (ref 150–440)
RBC: 2.4 MIL/uL — AB (ref 3.80–5.20)
RDW: 20.2 % — AB (ref 11.5–14.5)
WBC: 13.8 10*3/uL — AB (ref 3.6–11.0)

## 2018-06-02 LAB — BASIC METABOLIC PANEL
Anion gap: 7 (ref 5–15)
BUN: 18 mg/dL (ref 8–23)
CHLORIDE: 108 mmol/L (ref 98–111)
CO2: 24 mmol/L (ref 22–32)
Calcium: 8 mg/dL — ABNORMAL LOW (ref 8.9–10.3)
Creatinine, Ser: 0.66 mg/dL (ref 0.44–1.00)
Glucose, Bld: 141 mg/dL — ABNORMAL HIGH (ref 70–99)
POTASSIUM: 3.3 mmol/L — AB (ref 3.5–5.1)
SODIUM: 139 mmol/L (ref 135–145)

## 2018-06-02 LAB — IRON AND TIBC
Iron: 36 ug/dL (ref 28–170)
Saturation Ratios: 19 % (ref 10.4–31.8)
TIBC: 190 ug/dL — ABNORMAL LOW (ref 250–450)
UIBC: 154 ug/dL

## 2018-06-02 LAB — VITAMIN B12: VITAMIN B 12: 634 pg/mL (ref 180–914)

## 2018-06-02 LAB — HEMOGLOBIN: Hemoglobin: 6 g/dL — ABNORMAL LOW (ref 12.0–16.0)

## 2018-06-02 LAB — RETICULOCYTES
RBC.: 2.32 MIL/uL — AB (ref 3.80–5.20)
RETIC COUNT ABSOLUTE: 34.8 10*3/uL (ref 19.0–183.0)
Retic Ct Pct: 1.5 % (ref 0.4–3.1)

## 2018-06-02 LAB — FERRITIN: FERRITIN: 33 ng/mL (ref 11–307)

## 2018-06-02 LAB — ABO/RH: ABO/RH(D): A POS

## 2018-06-02 LAB — PREPARE RBC (CROSSMATCH)

## 2018-06-02 LAB — FOLATE: Folate: 8.9 ng/mL (ref >5.9)

## 2018-06-02 MED ORDER — SODIUM CHLORIDE 0.9% IV SOLUTION
Freq: Once | INTRAVENOUS | Status: AC
  Administered 2018-06-02: 15:00:00 via INTRAVENOUS

## 2018-06-02 MED ORDER — METHYLPREDNISOLONE SODIUM SUCC 125 MG IJ SOLR
60.0000 mg | Freq: Two times a day (BID) | INTRAMUSCULAR | Status: DC
  Administered 2018-06-02 – 2018-06-05 (×6): 60 mg via INTRAVENOUS
  Filled 2018-06-02 (×6): qty 2

## 2018-06-02 MED ORDER — SODIUM CHLORIDE 0.9 % IV SOLN
300.0000 mg | Freq: Once | INTRAVENOUS | Status: AC
  Administered 2018-06-02: 300 mg via INTRAVENOUS
  Filled 2018-06-02: qty 15

## 2018-06-02 NOTE — Progress Notes (Signed)
Inquired lab for resp. panel result of the specimen that was sent to lab this am. I was instructed to re-collect it. Second specimen was sent again this evening. I confirmed with lab that they got it and will be sent out to [place]. Hopefully result will be up later tonight.

## 2018-06-02 NOTE — Progress Notes (Signed)
[employer] - [place] [facility]   PATIENT NAME: Cheryl [name]    MR#:  [id number]  DATE OF BIRTH:  [birth year]  SUBJECTIVE:   Patient here due to worsening shortness of breath, cough and noted to have CT chest findings suggestive of multifocal pneumonia.  No other acute events overnight.  Patient is asking for regular food as she does not like the heart healthy diet.  REVIEW OF SYSTEMS:    Review of Systems  Constitutional: Negative for chills and fever.  HENT: Negative for congestion and tinnitus.   Eyes: Negative for blurred vision and double vision.  Respiratory: Positive for cough and shortness of breath. Negative for wheezing.   Cardiovascular: Negative for chest pain, orthopnea and PND.  Gastrointestinal: Negative for abdominal pain, diarrhea, nausea and vomiting.  Genitourinary: Negative for dysuria and hematuria.  Neurological: Negative for dizziness, sensory change and focal weakness.  All other systems reviewed and are negative.   Nutrition: Regular Tolerating Diet: Yes Tolerating PT: Await Eval.   DRUG ALLERGIES:   Allergies  Allergen Reactions  . Percocet [Oxycodone-Acetaminophen] Hives and Rash  . Aspirin Nausea And Vomiting and Other (See Comments)    Reaction:  GI upset   . Codeine Nausea And Vomiting, Other (See Comments) and Nausea Only    Reaction:  GI upset   . Propoxyphene Other (See Comments) and Nausea Only    GI upset Reaction:  GI upset   . Sulfa Antibiotics Rash and Other (See Comments)    Reaction:  GI upset     VITALS:  Blood pressure (!) 130/51, pulse (!) 118, temperature 98.9 F (37.2 C), temperature source Oral, resp. rate 20, height 4\' 10"  (1.473 m), weight 40.8 kg (90 lb), SpO2 99 %.  PHYSICAL EXAMINATION:   Physical Exam  GENERAL:  67 y.o.-year-old thin patient lying in bed in no acute distress.  EYES: Pupils equal, round, reactive to light and accommodation. No scleral icterus. Extraocular muscles intact.  HEENT:  Head atraumatic, normocephalic. Oropharynx and nasopharynx clear.  NECK:  Supple, no jugular venous distention. No thyroid enlargement, no tenderness.  LUNGS: Good A/e B/l, minimal end-exp wheezing, No rales, rhonchi. No use of accessory muscles of respiration.  CARDIOVASCULAR: S1, S2 normal. No murmurs, rubs, or gallops.  ABDOMEN: Soft, nontender, nondistended. Bowel sounds present. No organomegaly or mass.  EXTREMITIES: No cyanosis, clubbing or edema b/l.    NEUROLOGIC: Cranial nerves II through XII are intact. No focal Motor or sensory deficits b/l.  Globally weak PSYCHIATRIC: The patient is alert and oriented x 3.  SKIN: No obvious rash, lesion, or ulcer.    LABORATORY PANEL:   CBC Recent Labs  Lab 06/02/18 0431 06/02/18 1051  WBC 13.8*  --   HGB 6.3* 6.0*  HCT 19.3*  --   PLT 287  --    ------------------------------------------------------------------------------------------------------------------  Chemistries  Recent Labs  Lab 06/01/18 1219 06/02/18 0431  NA 139 139  K 4.0 3.3*  CL 103 108  CO2 27 24  GLUCOSE 97 141*  BUN 32* 18  CREATININE 0.78 0.66  CALCIUM 8.3* 8.0*  AST 17  --   ALT 10  --   ALKPHOS 61  --   BILITOT 0.6  --    ------------------------------------------------------------------------------------------------------------------  Cardiac Enzymes No results for input(s): TROPONINI in the last 168 hours. ------------------------------------------------------------------------------------------------------------------  RADIOLOGY:  Dg Chest 2 View  Result Date: 06/01/2018 CLINICAL DATA:  Fever and productive cough. History of asthma/COPD, CAD and hypertension. EXAM: CHEST - 2  VIEW COMPARISON:  05/25/2018 and 05/16/2018 radiographs. FINDINGS: Persistent patient rotation to the right. The heart size and mediastinal contours are stable. There is overall improved aeration of the right middle lobe and lingula. Chronic interstitial prominence is  otherwise unchanged. There is no edema, confluent airspace opacity, pleural effusion or pneumothorax. Abdominal aortic stent graft is noted. No acute osseous findings are seen. IMPRESSION: Overall improved aeration of both lung bases with otherwise stable generalized interstitial prominence. No acute cardiopulmonary process. Electronically Signed   By: Carey Bullocks M.D.   On: 06/01/2018 12:19   Ct Chest W Contrast  Result Date: 06/01/2018 CLINICAL DATA:  67 year old presenting with acute onset of fever up to 103 degrees Fahrenheit associated with a productive cough. Patient recently discharged from the hospital with a diagnosis of pneumonia. Hypoxemia upon arrival to the emergency department with oxygen saturation 88% on room air. Crackles in the lung bases on clinical examination. EXAM: CT CHEST WITH CONTRAST TECHNIQUE: Multidetector CT imaging of the chest was performed during intravenous contrast administration. CONTRAST:  50mL OMNIPAQUE IOHEXOL 300 MG/ML IV. COMPARISON:  CT chest 06/03/2016 and earlier. Chest x-rays earlier same day, 05/25/2018 and earlier. FINDINGS: Cardiovascular: Heart mildly enlarged, unchanged. Moderate three-vessel coronary atherosclerosis. No pericardial effusion. Moderate to severe atherosclerosis involving the thoracic and UPPER abdominal aorta without evidence of aneurysm. Atherosclerosis at the origin of the LEFT subclavian artery without evidence of hemodynamically significant stenosis. Atherosclerosis at the origin of the celiac and superior mesenteric arteries without evidence of hemodynamically significant stenosis. Mediastinum/Nodes: Scattered upper normal sized mediastinal lymph nodes, the largest in the subcarinal region (station 7) measuring approximately 1.6 x 1.7 cm, unchanged from prior examinations. No new or enlarging lymphadenopathy. Large hiatal hernia. Normal appearing esophagus. Normal-appearing thyroid gland. Lungs/Pleura: Marked hyperinflation with  emphysematous changes throughout both lungs. Peripheral blebs in the deep LATERAL RIGHT lower lobe as noted previously. Airspace opacities throughout both lungs with a nodular acinar pattern, involving the RIGHT upper lobe, RIGHT lower lobe and LEFT lower lobe. The greatest involvement is in the RIGHT lower lobe. Linear scarring in the lingula, RIGHT middle lobe and both lower lobes, unchanged. No pleural effusions. Central airways patent with marked central bronchial wall thickening. Upper Abdomen: Possible hepatomegaly, though the liver is incompletely imaged. Anatomic variant in that the LEFT lobe of the liver extends well across the midline into the LEFT UPPER quadrant. Apart from the large hiatal hernia, no significant findings elsewhere in the visualized upper abdomen. Musculoskeletal: Severe degenerative disc disease and spondylosis at C6-7, incompletely imaged. Osseous demineralization. Mild to moderate degenerative disc disease and spondylosis involving the mid and LOWER thoracic spine. Remote fractures involving the RIGHT POSTERIOR eighth rib and the LEFT LATERAL ninth rib. No acute findings. IMPRESSION: 1. Patchy pneumonia involving the BILATERAL lower lobes and the RIGHT upper lobe, with the greatest involvement in the RIGHT lower lobe. Marked central peribronchial thickening indicating asthma and/or bronchitis. 2. Stable mild reactive mediastinal lymphadenopathy. No new or enlarging lymphadenopathy. 3. Large hiatal hernia. 4. Possible hepatomegaly, though the liver is incompletely imaged. 5. Stable mild cardiomegaly. Moderate three-vessel coronary atherosclerosis. Aortic Atherosclerosis (ICD10-I70.0) and Emphysema (ICD10-J43.9). Electronically Signed   By: Hulan Saas M.D.   On: 06/01/2018 14:11     ASSESSMENT AND PLAN:   67 year old female with past medical history of hypertension, hyperlipidemia, coronary artery disease, COPD, GERD, history of anxiety, neuropathy, migraines, chronic pain,  recent admission for pneumonia who presents to the hospital due to cough, shortness of breath and noted to  have COPD exacerbation secondary to pneumonia.  1.  Acute on chronic respiratory failure with hypoxia-secondary to COPD exacerbation. - Improving.  We will wean IV steroids today, continue duo nebs, Pulmicort nebs. - MRSA PCR was negative therefore will DC vancomycin continue cefepime.  Follow clinically.  2.  COPD exacerbation-secondary to pneumonia. - Continue IV steroids, but will taper, continue duo nebs, Pulmicort nebs. -Continue empiric IV cefepime.  3.  Pneumonia- patient was recently admitted for pneumonia and had sputum culture positive for Serratia, Klebsiella.  She presented to the hospital due to cough, shortness of breath and had CT chest which was suggestive of multifocal pneumonia. - We will treat the patient with IV cefepime, follow cultures.  4.  Anemia-this is a normocytic anemia.  Patient's iron studies are consistent with mild iron deficiency though. - We will give the patient a dose of IV iron.  Hemoglobin was down to 6.0 today and will transfuse 1 unit of packed red blood cells.  Follow hemoglobin.  This could also be contributing to patient's worsening shortness of breath. -No evidence of acute bleeding.  5. Anxiety - cont. Xanax.   6.  Chronic pain - cont. Fentanyl patch.   7.  Neuropathy - cont. Lyrica.   8.  Tobacco abuse - cont. Nicotine patch.     All the records are reviewed and case discussed with Care Management/Social Worker. Management plans discussed with the patient, family and they are in agreement.  CODE STATUS: Full code  DVT Prophylaxis: Ted's & SCD's  TOTAL TIME TAKING CARE OF THIS PATIENT: 30 minutes.   POSSIBLE D/C IN 1-2 DAYS, DEPENDING ON CLINICAL CONDITION.   Houston SirenSAINANI,Yaira Bernardi J M.D on 06/02/2018 at 1:59 PM  Between 7am to 6pm - Pager - [phone]  After 6pm go to [email or web address] - Social research officer, governmentpassword EPAS [facility]  [facility] [place]  [facility]  Office  [phone]  CC: Primary care physician; Malva LimesFisher, Donald E, MD

## 2018-06-03 ENCOUNTER — Other Ambulatory Visit: Payer: Self-pay | Admitting: *Deleted

## 2018-06-03 LAB — TYPE AND SCREEN
ABO/RH(D): A POS
Antibody Screen: NEGATIVE
Unit division: 0

## 2018-06-03 LAB — RESPIRATORY PANEL BY PCR
Adenovirus: NOT DETECTED
Bordetella pertussis: NOT DETECTED
Chlamydophila pneumoniae: NOT DETECTED
Coronavirus 229E: NOT DETECTED
Coronavirus HKU1: NOT DETECTED
Coronavirus NL63: NOT DETECTED
Coronavirus OC43: NOT DETECTED
INFLUENZA A-RVPPCR: NOT DETECTED
INFLUENZA B-RVPPCR: NOT DETECTED
MYCOPLASMA PNEUMONIAE-RVPPCR: NOT DETECTED
Metapneumovirus: NOT DETECTED
PARAINFLUENZA VIRUS 4-RVPPCR: NOT DETECTED
Parainfluenza Virus 1: NOT DETECTED
Parainfluenza Virus 2: NOT DETECTED
Parainfluenza Virus 3: NOT DETECTED
RESPIRATORY SYNCYTIAL VIRUS-RVPPCR: NOT DETECTED
Rhinovirus / Enterovirus: NOT DETECTED

## 2018-06-03 LAB — CBC
HCT: 23.8 % — ABNORMAL LOW (ref 35.0–47.0)
Hemoglobin: 8 g/dL — ABNORMAL LOW (ref 12.0–16.0)
MCH: 27.5 pg (ref 26.0–34.0)
MCHC: 33.5 g/dL (ref 32.0–36.0)
MCV: 82.2 fL (ref 80.0–100.0)
PLATELETS: 278 10*3/uL (ref 150–440)
RBC: 2.9 MIL/uL — AB (ref 3.80–5.20)
RDW: 19 % — ABNORMAL HIGH (ref 11.5–14.5)
WBC: 11 10*3/uL (ref 3.6–11.0)

## 2018-06-03 LAB — BPAM RBC
Blood Product Expiration Date: 201907312359
ISSUE DATE / TIME: 201907091450
UNIT TYPE AND RH: 6200

## 2018-06-03 LAB — BASIC METABOLIC PANEL
ANION GAP: 6 (ref 5–15)
BUN: 16 mg/dL (ref 8–23)
CO2: 25 mmol/L (ref 22–32)
Calcium: 8.2 mg/dL — ABNORMAL LOW (ref 8.9–10.3)
Chloride: 108 mmol/L (ref 98–111)
Creatinine, Ser: 0.8 mg/dL (ref 0.44–1.00)
GFR calc Af Amer: >60 mL/min (ref >60)
Glucose, Bld: 127 mg/dL — ABNORMAL HIGH (ref 70–99)
POTASSIUM: 3.4 mmol/L — AB (ref 3.5–5.1)
SODIUM: 139 mmol/L (ref 135–145)

## 2018-06-03 LAB — LACTATE DEHYDROGENASE: LDH: 109 U/L (ref 98–192)

## 2018-06-03 MED ORDER — SODIUM CHLORIDE 0.9 % IV SOLN: INTRAVENOUS | Status: DC | PRN

## 2018-06-03 MED ORDER — TRAMADOL HCL 50 MG PO TABS
50.0000 mg | ORAL_TABLET | Freq: Four times a day (QID) | ORAL | Status: DC | PRN
  Administered 2018-06-03 – 2018-06-04 (×2): 50 mg via ORAL
  Filled 2018-06-03 (×2): qty 1

## 2018-06-03 NOTE — Progress Notes (Signed)
Called lab regarding resp panel sent yesterday, [facility] [place].  Lab to call AM RN with result/information.

## 2018-06-03 NOTE — Patient Outreach (Signed)
[facility] Care Management  06/03/2018  Cheryl [name] 23-Nov-1951 [id number]   In basket message to Janci Minor , [facility] hospital liaison to notify of patient readmission on 7/8.  Plan Will follow for disposition .    Egbert GaribaldiKimberly Glover, RN, [facility] Care Management,Care Management Coordinator  [phone]- Mobile [phone]- Toll Free Main Office

## 2018-06-03 NOTE — Progress Notes (Signed)
[employer] - [place] [facility]   PATIENT NAME: Cheryl [name]    MR#:  [id number]  DATE OF BIRTH:  [birth year]  SUBJECTIVE:   Patient continues to have cough and shortness of breath  REVIEW OF SYSTEMS:    Review of Systems  Constitutional: Negative for chills and fever.  HENT: Negative for congestion and tinnitus.   Eyes: Negative for blurred vision and double vision.  Respiratory: Positive for cough and shortness of breath. Negative for wheezing.   Cardiovascular: Negative for chest pain, orthopnea and PND.  Gastrointestinal: Negative for abdominal pain, diarrhea, nausea and vomiting.  Genitourinary: Negative for dysuria and hematuria.  Neurological: Negative for dizziness, sensory change and focal weakness.  All other systems reviewed and are negative.   Nutrition: Regular Tolerating Diet: Yes Tolerating PT: Await Eval.   DRUG ALLERGIES:   Allergies  Allergen Reactions  . Percocet [Oxycodone-Acetaminophen] Hives and Rash  . Aspirin Nausea And Vomiting and Other (See Comments)    Reaction:  GI upset   . Codeine Nausea And Vomiting, Other (See Comments) and Nausea Only    Reaction:  GI upset   . Propoxyphene Other (See Comments) and Nausea Only    GI upset Reaction:  GI upset   . Sulfa Antibiotics Rash and Other (See Comments)    Reaction:  GI upset     VITALS:  Blood pressure (!) 145/72, pulse 93, temperature 99.2 F (37.3 C), temperature source Oral, resp. rate 18, height 4\' 10"  (1.473 m), weight 40.8 kg (90 lb), SpO2 94 %.  PHYSICAL EXAMINATION:   Physical Exam  GENERAL:  67 y.o.-year-old thin patient lying in bed in no acute distress.  EYES: Pupils equal, round, reactive to light and accommodation. No scleral icterus. Extraocular muscles intact.  HEENT: Head atraumatic, normocephalic. Oropharynx and nasopharynx clear.  NECK:  Supple, no jugular venous distention. No thyroid enlargement, no tenderness.  LUNGS: Good A/e B/l, minimal end-exp  wheezing, No rales, rhonchi. No use of accessory muscles of respiration.  CARDIOVASCULAR: S1, S2 normal. No murmurs, rubs, or gallops.  ABDOMEN: Soft, nontender, nondistended. Bowel sounds present. No organomegaly or mass.  EXTREMITIES: No cyanosis, clubbing or edema b/l.    NEUROLOGIC: Cranial nerves II through XII are intact. No focal Motor or sensory deficits b/l.  Globally weak PSYCHIATRIC: The patient is alert and oriented x 3.  SKIN: No obvious rash, lesion, or ulcer.    LABORATORY PANEL:   CBC Recent Labs  Lab 06/03/18 0645  WBC 11.0  HGB 8.0*  HCT 23.8*  PLT 278   ------------------------------------------------------------------------------------------------------------------  Chemistries  Recent Labs  Lab 06/01/18 1219  06/03/18 0645  NA 139   < > 139  K 4.0   < > 3.4*  CL 103   < > 108  CO2 27   < > 25  GLUCOSE 97   < > 127*  BUN 32*   < > 16  CREATININE 0.78   < > 0.80  CALCIUM 8.3*   < > 8.2*  AST 17  --   --   ALT 10  --   --   ALKPHOS 61  --   --   BILITOT 0.6  --   --    < > = values in this interval not displayed.   ------------------------------------------------------------------------------------------------------------------  Cardiac Enzymes No results for input(s): TROPONINI in the last 168 hours. ------------------------------------------------------------------------------------------------------------------  RADIOLOGY:  No results found.   ASSESSMENT AND PLAN:   67 year old female with past medical  history of hypertension, hyperlipidemia, coronary artery disease, COPD, GERD, history of anxiety, neuropathy, migraines, chronic pain, recent admission for pneumonia who presents to the hospital due to cough, shortness of breath and noted to have COPD exacerbation secondary to pneumonia.  1.  Acute on chronic respiratory failure with hypoxia-secondary to COPD exacerbation. - Continue IV steroids continue duo nebs, Pulmicort nebs. - continue  cefepime.  Follow clinically. -Check procalcitonin level -Add Mucinex to current regimen   2.  COPD exacerbation-secondary to pneumonia. - Continue IV steroids, , continue duo nebs, Pulmicort nebs. -Continue empiric IV cefepime.  3.  Pneumonia- patient was recently admitted for pneumonia and had sputum culture positive for Serratia, Klebsiella.  She presented to the hospital due to cough, shortness of breath and had CT chest which was suggestive of multifocal pneumonia. - We will treat the patient with IV cefepime,   4.  Anemia acute on chronic-this is a normocytic anemia.  Patient's iron studies are consistent with mild iron deficiency though. - Status post transfusion -Check LDH and haptoglobin -Likely anemia of chronic disease   5. Anxiety - cont. Xanax.   6.  Chronic pain - cont. Fentanyl patch.   7.  Neuropathy - cont. Lyrica.   8.  Tobacco abuse - cont. Nicotine patch.     All the records are reviewed and case discussed with Care Management/Social Worker. Management plans discussed with the patient, family and they are in agreement.  CODE STATUS: Full code  DVT Prophylaxis: Ted's & SCD's  TOTAL TIME TAKING CARE OF THIS PATIENT: 30 minutes.   POSSIBLE D/C IN 1-2 DAYS, DEPENDING ON CLINICAL CONDITION.   Auburn Bilberry M.D on 06/03/2018 at 2:17 PM  Between 7am to 6pm - Pager - [phone]  After 6pm go to [email or web address] - Social research officer, government  [facility] [place] [facility]  Office  [phone]  CC: Primary care physician; Malva Limes, MD

## 2018-06-04 DIAGNOSIS — I739 Peripheral vascular disease, unspecified: Secondary | ICD-10-CM

## 2018-06-04 DIAGNOSIS — I10 Essential (primary) hypertension: Secondary | ICD-10-CM

## 2018-06-04 DIAGNOSIS — Z79899 Other long term (current) drug therapy: Secondary | ICD-10-CM

## 2018-06-04 DIAGNOSIS — L03116 Cellulitis of left lower limb: Secondary | ICD-10-CM

## 2018-06-04 DIAGNOSIS — F0390 Unspecified dementia without behavioral disturbance: Secondary | ICD-10-CM

## 2018-06-04 DIAGNOSIS — J449 Chronic obstructive pulmonary disease, unspecified: Secondary | ICD-10-CM

## 2018-06-04 DIAGNOSIS — J9620 Acute and chronic respiratory failure, unspecified whether with hypoxia or hypercapnia: Secondary | ICD-10-CM

## 2018-06-04 DIAGNOSIS — I2510 Atherosclerotic heart disease of native coronary artery without angina pectoris: Secondary | ICD-10-CM

## 2018-06-04 DIAGNOSIS — D649 Anemia, unspecified: Secondary | ICD-10-CM

## 2018-06-04 DIAGNOSIS — E785 Hyperlipidemia, unspecified: Secondary | ICD-10-CM

## 2018-06-04 DIAGNOSIS — J188 Other pneumonia, unspecified organism: Secondary | ICD-10-CM

## 2018-06-04 DIAGNOSIS — F17210 Nicotine dependence, cigarettes, uncomplicated: Secondary | ICD-10-CM

## 2018-06-04 LAB — CBC
HCT: 22.8 % — ABNORMAL LOW (ref 35.0–47.0)
Hemoglobin: 7.7 g/dL — ABNORMAL LOW (ref 12.0–16.0)
MCH: 27.8 pg (ref 26.0–34.0)
MCHC: 33.6 g/dL (ref 32.0–36.0)
MCV: 82.7 fL (ref 80.0–100.0)
PLATELETS: 269 10*3/uL (ref 150–440)
RBC: 2.76 MIL/uL — ABNORMAL LOW (ref 3.80–5.20)
RDW: 19.9 % — ABNORMAL HIGH (ref 11.5–14.5)
WBC: 7.6 10*3/uL (ref 3.6–11.0)

## 2018-06-04 LAB — BLOOD GAS, VENOUS
ACID-BASE EXCESS: 3.3 mmol/L — AB (ref 0.0–2.0)
BICARBONATE: 28.5 mmol/L — AB (ref 20.0–28.0)
PH VEN: 7.4 (ref 7.250–7.430)
Patient temperature: 37
pCO2, Ven: 46 mmHg (ref 44.0–60.0)

## 2018-06-04 LAB — BASIC METABOLIC PANEL
Anion gap: 8 (ref 5–15)
BUN: 17 mg/dL (ref 8–23)
CO2: 24 mmol/L (ref 22–32)
Calcium: 8.4 mg/dL — ABNORMAL LOW (ref 8.9–10.3)
Chloride: 109 mmol/L (ref 98–111)
Creatinine, Ser: 0.68 mg/dL (ref 0.44–1.00)
GFR calc Af Amer: >60 mL/min (ref >60)
Glucose, Bld: 109 mg/dL — ABNORMAL HIGH (ref 70–99)
Potassium: 3.1 mmol/L — ABNORMAL LOW (ref 3.5–5.1)
SODIUM: 141 mmol/L (ref 135–145)

## 2018-06-04 LAB — HAPTOGLOBIN: Haptoglobin: 344 mg/dL — ABNORMAL HIGH (ref 34–200)

## 2018-06-04 MED ORDER — POTASSIUM CHLORIDE CRYS ER 20 MEQ PO TBCR
40.0000 meq | EXTENDED_RELEASE_TABLET | ORAL | Status: AC
  Administered 2018-06-04 (×3): 40 meq via ORAL
  Filled 2018-06-04 (×3): qty 2

## 2018-06-04 MED ORDER — IPRATROPIUM-ALBUTEROL 0.5-2.5 (3) MG/3ML IN SOLN
3.0000 mL | Freq: Four times a day (QID) | RESPIRATORY_TRACT | Status: DC
  Administered 2018-06-04 – 2018-06-05 (×6): 3 mL via RESPIRATORY_TRACT
  Filled 2018-06-04 (×7): qty 3

## 2018-06-04 NOTE — Consult Note (Signed)
Hematology/Oncology Consult note [facility] Telephone:([phone] [phone]  Patient Care Team: Birdie Sons, MD as PCP - General (Family Medicine) Milinda Pointer, MD (Pain Medicine) Dionisio David, MD as Consulting Physician (Cardiology) Sherrlyn Hock, MD as Referring Physician (Gastroenterology) Vladimir Crofts, MD as Consulting Physician (Neurology) Alfonzo Feller, RN as [facility]   Name of the patient: Cheryl [name]  [id number]  06/17/1951    Reason for referral- anemia   Referring physician- Dr. Posey Pronto  Date of visit: 06/04/2018    History of presenting illness-patient is a 67 year old female with a past medical history that is significant for multiple comorbidities including coronary artery disease hypertension hyperlipidemia COPD peripheral vascular disease among other medical problems.  She was treated for pneumonia in the hospital last week and was discharged on oral antibiotics but has been readmitted for symptoms of shortness of breath tachycardia and tachypnea.  I have been consulted for her anemia  Looking back at her CBC patient has had chronic anemia with a hemoglobin around 10 since 2017.  It then gradually drifted down to the low nines in 2018.  Since June 2019 her hemoglobin has been between 7-8.  She was found to have a hemoglobin of 6.3/19.3 when she was admitted on 06/02/2018.  BMP was within normal limits and there was no evidence of chronic kidney disease.  Iron studies were indicative of chronic disease.  B12 and folate were normal.  Haptoglobin was elevated and LDH was normal reticulocyte count was normal not suggestive of hemolysis.  She also recently had an upper endoscopy on 05/18/2018 for symptoms of difficulty swallowing and was found to have mild gastritis but a normal esophagus and duodenum.  Overall she is feeling better in terms of her sob and likely to get discharged  tomorrow   ECOG PS- 2  Pain scale- 0   Review of systems- Review of Systems  Constitutional: Negative for chills, fever, malaise/fatigue and weight loss.  HENT: Negative for congestion, ear discharge and nosebleeds.   Eyes: Negative for blurred vision.  Respiratory: Positive for shortness of breath. Negative for cough, hemoptysis, sputum production and wheezing.   Cardiovascular: Negative for chest pain, palpitations, orthopnea and claudication.  Gastrointestinal: Negative for abdominal pain, blood in stool, constipation, diarrhea, heartburn, melena, nausea and vomiting.  Genitourinary: Negative for dysuria, flank pain, frequency, hematuria and urgency.  Musculoskeletal: Negative for back pain, joint pain and myalgias.  Skin: Negative for rash.  Neurological: Negative for dizziness, tingling, focal weakness, seizures, weakness and headaches.  Endo/Heme/Allergies: Does not bruise/bleed easily.  Psychiatric/Behavioral: Negative for depression and suicidal ideas. The patient does not have insomnia.     Allergies  Allergen Reactions  . Percocet [Oxycodone-Acetaminophen] Hives and Rash  . Aspirin Nausea And Vomiting and Other (See Comments)    Reaction:  GI upset   . Codeine Nausea And Vomiting, Other (See Comments) and Nausea Only    Reaction:  GI upset   . Propoxyphene Other (See Comments) and Nausea Only    GI upset Reaction:  GI upset   . Sulfa Antibiotics Rash and Other (See Comments)    Reaction:  GI upset     Patient Active Problem List   Diagnosis Date Noted  . Acute on chronic respiratory failure ([place]) 06/01/2018  . Gastritis without bleeding   . Hypoxia 05/14/2018  . Generalized weakness 02/08/2018  . Rib fracture 09/19/2017  . Marijuana use 06/05/2017  . Pneumonia 04/29/2017  .  Acute postoperative pain 04/14/2017  . Metabolic encephalopathy 66/59/9357  . Hypokalemia 12/23/2016  . Protein-calorie malnutrition, severe 11/06/2016  . Nausea with vomiting   .  Lumbar facet syndrome (Location of Primary Source of Pain) (Bilateral) (L>R) 03/20/2016  . Lumbar spondylosis 03/20/2016  . Opiate use (160 MME/Day) 02/28/2016  . Anemia 02/28/2016  . Osteoarthrosis 12/18/2015  . Long term current use of anticoagulant therapy (Plavix) 12/18/2015  . Long term current use of opiate analgesic 12/04/2015  . Fibromyalgia 12/04/2015  . Acute on chronic respiratory failure with hypoxia ([place]) 11/24/2015  . Dysphagia 11/24/2015  . Acute diastolic CHF (congestive heart failure) ([place]) 11/24/2015  . Leukocytosis 11/24/2015  . Abnormal mammogram of right breast 10/11/2015  . Dilated intrahepatic bile duct 10/11/2015  . Lumbar radicular pain (Bilateral) (L>R) (L4) 09/04/2015  . Chronic low back pain (Location of Primary Source of Pain) (Bilateral) (L>R) 09/04/2015  . Encounter for therapeutic drug level monitoring 09/04/2015  . Uncomplicated opioid dependence ([place]) 09/04/2015  . Chronic pain syndrome 09/04/2015  . Platelet inhibition due to Plavix 09/04/2015  . COPD with acute exacerbation ([place]) 07/31/2015  . Dementia 07/31/2015  . Airway hyperreactivity 07/03/2015  . Back pain, thoracic 07/03/2015  . Excessive falling 07/03/2015  . Alteration in bowel elimination: incontinence 07/03/2015  . Insomnia 07/03/2015  . Decreased testosterone level 07/03/2015  . Leg weakness 07/03/2015  . Menopausal symptom 07/03/2015  . Migraine 07/03/2015  . Neuropathy ([place]) 07/03/2015  . Fecal occult blood test positive 07/03/2015  . OP (osteoporosis) 07/03/2015  . Panic disorder 07/03/2015  . Compulsive tobacco user syndrome 07/03/2015  . Urinary incontinence 07/03/2015  . Weight loss 07/03/2015  . Essential hypertension 06/06/2015  . GERD (gastroesophageal reflux disease) 06/06/2015  . Hyperlipemia 06/06/2015  . Peripheral nerve disease 04/13/2014  . Anxiety 02/10/2014  . Coronary artery disease 02/10/2014  . Peripheral vascular disease ([place]) 02/10/2014  . Vitamin D  deficiency 10/16/2009     Past Medical History:  Diagnosis Date  . Allergy   . Anxiety   . Arthritis   . Aspiration pneumonitis ([place]) 11/24/2015  . Asthma   . Chronic pain   . COPD (chronic obstructive pulmonary disease) ([place])   . Coronary artery disease   . DVT (deep venous thrombosis) ([place])   . GERD (gastroesophageal reflux disease)   . Headache   . Hyperlipidemia   . Hypertension   . Kyphoscoliosis deformity of spine   . Migraines   . Neuropathy 2010  . Osteoporosis   . Oxygen deficiency   . Peripheral vascular disease ([place])   . Pneumonia   . Pneumonia 11/19/2015  . Pneumonia 10/2016  . Pneumonia    aspiration 2018- 4 times in last year  . Vitamin D deficiency      Past Surgical History:  Procedure Laterality Date  . ABDOMINAL HYSTERECTOMY  1975   Bilaterl Oophorectomy; [place] to IUD infection  . abdomnal aortic stent  05/30/2008   Dr. Quay Burow  . APPENDECTOMY    . APPENDECTOMY    . cardiac catherization  10/31/2009  . CERVICAL FUSION  C5 - 6/C6-7  . CHOLECYSTECTOMY  1972  . COLONOSCOPY WITH PROPOFOL N/A 07/27/2015   Procedure: COLONOSCOPY WITH PROPOFOL;  Surgeon: Hulen Luster, MD;  Location: [facility] ENDOSCOPY;  Service: Gastroenterology;  Laterality: N/A;  . ESOPHAGOGASTRODUODENOSCOPY (EGD) WITH PROPOFOL N/A 07/27/2015   Procedure: ESOPHAGOGASTRODUODENOSCOPY (EGD) WITH PROPOFOL;  Surgeon: Hulen Luster, MD;  Location: [facility] ENDOSCOPY;  Service: Gastroenterology;  Laterality: N/A;  . ESOPHAGOGASTRODUODENOSCOPY (EGD)  WITH PROPOFOL N/A 05/19/2018   Procedure: ESOPHAGOGASTRODUODENOSCOPY (EGD) WITH PROPOFOL;  Surgeon: Lucilla Lame, MD;  Location: [facility] ENDOSCOPY;  Service: Endoscopy;  Laterality: N/A;  . FOOT SURGERY Bilateral    5-6 years per patient  . SPINE SURGERY      Social History   Socioeconomic History  . Marital status: Married    Spouse name: Not on file  . Number of children: 6  . Years of education: Not on file  . Highest education level: Not on file    Occupational History  . Occupation: Disabled  Social Needs  . Financial resource strain: Not on file  . Food insecurity:    Worry: Not on file    Inability: Not on file  . Transportation needs:    Medical: Not on file    Non-medical: Not on file  Tobacco Use  . Smoking status: Current Every Day Smoker    Packs/day: 1.00    Years: 50.00    Pack years: 50.00    Types: Cigarettes  . Smokeless tobacco: Never Used  . Tobacco comment: Previously smoked 2 ppd  Substance and Sexual Activity  . Alcohol use: No    Alcohol/week: 0.0 oz  . Drug use: No  . Sexual activity: Never  Lifestyle  . Physical activity:    Days per week: Not on file    Minutes per session: Not on file  . Stress: Not on file  Relationships  . Social connections:    Talks on phone: Not on file    Gets together: Not on file    Attends religious service: Not on file    Active member of club or organization: Not on file    Attends meetings of clubs or organizations: Not on file    Relationship status: Not on file  . Intimate partner violence:    Fear of current or ex partner: Not on file    Emotionally abused: Not on file    Physically abused: Not on file    Forced sexual activity: Not on file  Other Topics Concern  . Not on file  Social History Narrative  . Not on file     Family History  Problem Relation Age of Onset  . Cancer Mother   . Arthritis Mother   . Heart disease Mother   . Diabetes Mother        mellitus, type 2  . Heart disease Father   . Diabetes Sister   . Cancer Brother   . Cancer Brother        lung  . Diabetes Brother      Current Facility-Administered Medications:  .  0.9 %  sodium chloride infusion, , Intravenous, PRN, Dustin Flock, MD .  acetaminophen (TYLENOL) tablet 650 mg, 650 mg, Oral, Q6H PRN, Vaughan Basta, MD, 650 mg at 06/02/18 2000 .  ALPRAZolam (XANAX) tablet 0.25-0.5 mg, 0.25-0.5 mg, Oral, QHS PRN, Vaughan Basta, MD, 0.25 mg at 06/02/18  0233 .  budesonide (PULMICORT) nebulizer solution 0.25 mg, 0.25 mg, Nebulization, BID, Vaughan Basta, MD, 0.25 mg at 06/04/18 0724 .  ceFEPIme (MAXIPIME) 2 g in sodium chloride 0.9 % 100 mL IVPB, 2 g, Intravenous, Q12H, Vaughan Basta, MD, Stopped at 06/04/18 1025 .  clopidogrel (PLAVIX) tablet 75 mg, 75 mg, Oral, Daily, Vaughan Basta, MD, 75 mg at 06/03/18 2203 .  cyclobenzaprine (FLEXERIL) tablet 5 mg, 5 mg, Oral, TID PRN, Vaughan Basta, MD, 5 mg at 06/02/18 1958 .  docusate sodium (COLACE) capsule 100 mg,  100 mg, Oral, BID PRN, Vaughan Basta, MD .  donepezil (ARICEPT) tablet 10 mg, 10 mg, Oral, QHS, Vaughan Basta, MD, 10 mg at 06/03/18 2203 .  DULoxetine (CYMBALTA) DR capsule 30 mg, 30 mg, Oral, Daily, Vaughan Basta, MD, 30 mg at 06/03/18 2203 .  famotidine (PEPCID) tablet 20 mg, 20 mg, Oral, Daily, Vaughan Basta, MD, 20 mg at 06/04/18 0957 .  fentaNYL (DURAGESIC - dosed mcg/hr) 50 mcg, 50 mcg, Transdermal, Q72H, Vaughan Basta, MD, 50 mcg at 06/03/18 1654 .  ferrous sulfate tablet 325 mg, 325 mg, Oral, BID WC, Vaughan Basta, MD, 325 mg at 06/04/18 0748 .  ipratropium-albuterol (DUONEB) 0.5-2.5 (3) MG/3ML nebulizer solution 3 mL, 3 mL, Nebulization, Q6H, Dustin Flock, MD, 3 mL at 06/04/18 0724 .  methylPREDNISolone sodium succinate (SOLU-MEDROL) 125 mg/2 mL injection 60 mg, 60 mg, Intravenous, Q12H, Henreitta Leber, MD, 60 mg at 06/04/18 0950 .  nicotine (NICODERM CQ - dosed in mg/24 hours) patch 21 mg, 21 mg, Transdermal, Daily, Vaughan Basta, MD, 21 mg at 06/03/18 1007 .  pantoprazole (PROTONIX) EC tablet 40 mg, 40 mg, Oral, BID, Vaughan Basta, MD, 40 mg at 06/04/18 0749 .  potassium chloride SA (K-DUR,KLOR-CON) CR tablet 40 mEq, 40 mEq, Oral, Q4H, Dustin Flock, MD, 40 mEq at 06/04/18 1032 .  pregabalin (LYRICA) capsule 150 mg, 150 mg, Oral, BID, Vaughan Basta, MD, 150 mg at  06/04/18 0748 .  simvastatin (ZOCOR) tablet 10 mg, 10 mg, Oral, QHS, Vaughan Basta, MD, 10 mg at 06/03/18 2203 .  traMADol (ULTRAM) tablet 50 mg, 50 mg, Oral, Q6H PRN, Pyreddy, Pavan, MD, 50 mg at 06/03/18 1855 .  vitamin B-12 (CYANOCOBALAMIN) tablet 1,000 mcg, 1,000 mcg, Oral, Daily, Vaughan Basta, MD, 1,000 mcg at 06/04/18 0957   Physical exam:  Vitals:   06/03/18 2013 06/04/18 0214 06/04/18 0333 06/04/18 0726  BP: 137/65  (!) 151/78   Pulse: 84 70 69   Resp: '16 16 18   '$ Temp: 98.9 F (37.2 C)  98.9 F (37.2 C)   TempSrc: Oral  Oral   SpO2: 100% 99% 100% 95%  Weight:      Height:       Physical Exam  Constitutional: She is oriented to person, place, and time.  Thin female appears older than stated age. No acute distress  HENT:  Head: Normocephalic and atraumatic.  Eyes: Pupils are equal, round, and reactive to light. EOM are normal.  Neck: Normal range of motion.  Cardiovascular: Normal rate, regular rhythm and normal heart sounds.  Pulmonary/Chest: Effort normal and breath sounds normal.  Abdominal: Soft. Bowel sounds are normal.  Neurological: She is alert and oriented to person, place, and time.  Skin: Skin is warm and dry.       CMP Latest Ref Rng & Units 06/04/2018  Glucose 70 - 99 mg/dL 109(H)  BUN 8 - 23 mg/dL 17  Creatinine 0.44 - 1.00 mg/dL 0.68  Sodium 135 - 145 mmol/L 141  Potassium 3.5 - 5.1 mmol/L 3.1(L)  Chloride 98 - 111 mmol/L 109  CO2 22 - 32 mmol/L 24  Calcium 8.9 - 10.3 mg/dL 8.4(L)  Total Protein 6.5 - 8.1 g/dL -  Total Bilirubin 0.3 - 1.2 mg/dL -  Alkaline Phos 38 - 126 U/L -  AST 15 - 41 U/L -  ALT 0 - 44 U/L -   CBC Latest Ref Rng & Units 06/04/2018  WBC 3.6 - 11.0 K/uL 7.6  Hemoglobin 12.0 - 16.0 g/dL 7.7(L)  Hematocrit 35.0 -  47.0 % 22.8(L)  Platelets 150 - 440 K/uL 269    '@IMAGES'$ @  Dg Chest 2 View  Result Date: 06/01/2018 CLINICAL DATA:  Fever and productive cough. History of asthma/COPD, CAD and hypertension.  EXAM: CHEST - 2 VIEW COMPARISON:  05/25/2018 and 05/16/2018 radiographs. FINDINGS: Persistent patient rotation to the right. The heart size and mediastinal contours are stable. There is overall improved aeration of the right middle lobe and lingula. Chronic interstitial prominence is otherwise unchanged. There is no edema, confluent airspace opacity, pleural effusion or pneumothorax. Abdominal aortic stent graft is noted. No acute osseous findings are seen. IMPRESSION: Overall improved aeration of both lung bases with otherwise stable generalized interstitial prominence. No acute cardiopulmonary process. Electronically Signed   By: Richardean Sale M.D.   On: 06/01/2018 12:19   Dg Chest 2 View  Result Date: 05/26/2018 CLINICAL DATA:  Cough EXAM: CHEST - 2 VIEW COMPARISON:  05/16/2018 FINDINGS: Cardiac shadow is mildly enlarged. The lungs are well aerated bilaterally with mild interstitial changes stable from the prior exam. No focal infiltrate is seen. Some scarring in the lingula and right middle lobe is noted. No acute bony abnormality is noted. IMPRESSION: Scarring without acute abnormality. Electronically Signed   By: Inez Catalina M.D.   On: 05/26/2018 08:55   Dg Chest 2 View  Result Date: 05/16/2018 CLINICAL DATA:  Cough; Pt. admitted 05-14-18 due to AMS, lethargy, confusion. Suspected to have underlying aspiration pneumonia but chest x-ray has been negative. She did have a low-grade fever admission.Hx/o COPD, CAD, DVTSmoker EXAM: CHEST - 2 VIEW COMPARISON:  05/14/2018 and older exams. FINDINGS: Cardiac silhouette is normal in size. No mediastinal or hilar masses. No convincing adenopathy. Small to moderate hiatal hernia. There is opacity at the posterior left lung base, mildly increased when compared to the study dated 02/08/2018. This may reflect pneumonia or atelectasis, the latter favored. There are prominent bronchovascular markings. No other evidence pneumonia. No evidence of pulmonary edema. No  pleural effusion or pneumothorax. Skeletal structures are demineralized but grossly intact. IMPRESSION: 1. Left posterior lower lobe opacity mildly increased when compared to a chest radiograph dated 02/08/2018. This may reflect pneumonia or be due to atelectasis. 2. No other evidence of acute cardiopulmonary disease. Electronically Signed   By: Lajean Manes M.D.   On: 05/16/2018 13:30   Dg Wrist Complete Left  Result Date: 05/20/2018 CLINICAL DATA:  Left wrist pain EXAM: LEFT WRIST - COMPLETE 3+ VIEW COMPARISON:  04/09/2016 FINDINGS: Osteoarthritis at the base of the thumb metacarpal and triscaphe joints are redemonstrated with progressive degenerative cystic change noted at the base of the thumb metacarpal. Osteoarthritic joint space narrowing is also identified with spurring about the first MCP joint. Carpal rows are maintained. No acute fracture or suspicious osseous lesions. Mild generalized soft tissue swelling is suggested. IMPRESSION: Osteoarthritis of the first MCP, CMC and triscaphe joints, slightly progressed since prior. No acute osseous abnormality. Electronically Signed   By: Ashley Royalty M.D.   On: 05/20/2018 18:38   Ct Chest W Contrast  Result Date: 06/01/2018 CLINICAL DATA:  67 year old presenting with acute onset of fever up to 103 degrees Fahrenheit associated with a productive cough. Patient recently discharged from the hospital with a diagnosis of pneumonia. Hypoxemia upon arrival to the emergency department with oxygen saturation 88% on room air. Crackles in the lung bases on clinical examination. EXAM: CT CHEST WITH CONTRAST TECHNIQUE: Multidetector CT imaging of the chest was performed during intravenous contrast administration. CONTRAST:  58m OMNIPAQUE IOHEXOL  300 MG/ML IV. COMPARISON:  CT chest 06/03/2016 and earlier. Chest x-rays earlier same day, 05/25/2018 and earlier. FINDINGS: Cardiovascular: Heart mildly enlarged, unchanged. Moderate three-vessel coronary atherosclerosis. No  pericardial effusion. Moderate to severe atherosclerosis involving the thoracic and UPPER abdominal aorta without evidence of aneurysm. Atherosclerosis at the origin of the LEFT subclavian artery without evidence of hemodynamically significant stenosis. Atherosclerosis at the origin of the celiac and superior mesenteric arteries without evidence of hemodynamically significant stenosis. Mediastinum/Nodes: Scattered upper normal sized mediastinal lymph nodes, the largest in the subcarinal region (station 7) measuring approximately 1.6 x 1.7 cm, unchanged from prior examinations. No new or enlarging lymphadenopathy. Large hiatal hernia. Normal appearing esophagus. Normal-appearing thyroid gland. Lungs/Pleura: Marked hyperinflation with emphysematous changes throughout both lungs. Peripheral blebs in the deep LATERAL RIGHT lower lobe as noted previously. Airspace opacities throughout both lungs with a nodular acinar pattern, involving the RIGHT upper lobe, RIGHT lower lobe and LEFT lower lobe. The greatest involvement is in the RIGHT lower lobe. Linear scarring in the lingula, RIGHT middle lobe and both lower lobes, unchanged. No pleural effusions. Central airways patent with marked central bronchial wall thickening. Upper Abdomen: Possible hepatomegaly, though the liver is incompletely imaged. Anatomic variant in that the LEFT lobe of the liver extends well across the midline into the LEFT UPPER quadrant. Apart from the large hiatal hernia, no significant findings elsewhere in the visualized upper abdomen. Musculoskeletal: Severe degenerative disc disease and spondylosis at C6-7, incompletely imaged. Osseous demineralization. Mild to moderate degenerative disc disease and spondylosis involving the mid and LOWER thoracic spine. Remote fractures involving the RIGHT POSTERIOR eighth rib and the LEFT LATERAL ninth rib. No acute findings. IMPRESSION: 1. Patchy pneumonia involving the BILATERAL lower lobes and the RIGHT  upper lobe, with the greatest involvement in the RIGHT lower lobe. Marked central peribronchial thickening indicating asthma and/or bronchitis. 2. Stable mild reactive mediastinal lymphadenopathy. No new or enlarging lymphadenopathy. 3. Large hiatal hernia. 4. Possible hepatomegaly, though the liver is incompletely imaged. 5. Stable mild cardiomegaly. Moderate three-vessel coronary atherosclerosis. Aortic Atherosclerosis (ICD10-I70.0) and Emphysema (ICD10-J43.9). Electronically Signed   By: Evangeline Dakin M.D.   On: 06/01/2018 14:11   Mr Brain Wo Contrast  Result Date: 05/18/2018 CLINICAL DATA:  Lethargy and confusion, which began recently. EXAM: MRI HEAD WITHOUT CONTRAST TECHNIQUE: Multiplanar, multiecho pulse sequences of the brain and surrounding structures were obtained without intravenous contrast. COMPARISON:  CT head 09/12/2017.  MR head 11/26/2012. FINDINGS: Brain: No evidence for acute infarction, hemorrhage, mass lesion, hydrocephalus, or extra-axial fluid. Normal cerebral volume. Moderately advanced T2 and FLAIR hyperintensities throughout the white matter, likely small vessel disease. Vascular: Flow voids are maintained throughout the carotid, basilar, and vertebral arteries. There are no areas of chronic hemorrhage. Skull and upper cervical spine: Unremarkable visualized calvarium, skullbase, and cervical vertebrae. Pituitary, pineal, cerebellar tonsils unremarkable. No upper cervical cord lesions. Sinuses/Orbits: No orbital masses or proptosis. Globes appear symmetric. Sinuses appear well aerated, without evidence for air-fluid level. Other: None. IMPRESSION: Normal for age cerebral volume. Moderately advanced small vessel disease, similar to prior MR. No acute intracranial findings. Electronically Signed   By: Staci Righter M.D.   On: 05/18/2018 14:52   Dg Chest Port 1 View  Result Date: 05/14/2018 CLINICAL DATA:  Hypoxia. EXAM: PORTABLE CHEST 1 VIEW COMPARISON:  Radiographs of February 08, 2018. FINDINGS: The heart size and mediastinal contours are within normal limits. Both lungs are clear. Stable hiatal hernia is noted. No pneumothorax or pleural effusion is noted. Old  right posterior rib fracture is noted. IMPRESSION: No acute cardiopulmonary abnormality seen.  Stable hiatal hernia. Electronically Signed   By: Marijo Conception, M.D.   On: 05/14/2018 10:50    Assessment and plan- Patient is a 67 y.o. female with multiple comorbidities admitted for healthcare associated pneumonia.  We have been consulted for her anemia  Normocytic anemia: Recent blood work including iron studies, B12, folate as well as hemolysis work-up was unremarkable.  Iron studies did show a low TIBC likely indicative of anemia of chronic disease.  Her baseline hemoglobin runs between 9-10 but over the last 2 to 3 weeks its been around 8.  I suspect that her anemia is secondary to her chronic disease and its acutely low in the setting of recent infection.  She did have a low normal ferritin of 33 despite an acute infection so there may be a component of iron deficiency and it may be worthwhile to give her IV iron to see if her anemia improves.   CT chest does reveal bilateral pneumonia and reactive mediastinal adenopathy.  CT chest revealed possible hepatomegaly although it was incompletely imaged.  Prior CT abdomen from September 2018 did not reveal any acute pathology.    At this time I will add a myeloma panel to her anemia work-up but other than that I am inclined to follow this conservatively as an outpatient if she remains anemic over the next 1 to 2 months despite resolution of her acute infection I will consider doing a bone marrow biopsy as an outpatient.  She has isolated anemia in the presence of a normal white count and a normal platelet count and I am therefore not suspecting a primary hematological disorder at this time   Thank you for this kind referral and the opportunity to participate in the care of  this patient   Visit Diagnosis 1. Sepsis, due to unspecified organism ([place])   2. Community acquired pneumonia, unspecified laterality   3. Cellulitis of left lower extremity     Dr. Randa Evens, MD, MPH [facility] [phone] 06/04/2018  5:19 PM

## 2018-06-04 NOTE — Care Management Important Message (Signed)
Copy of signed IM left with patient in room.  

## 2018-06-04 NOTE — Progress Notes (Signed)
[facility] - [place] [facility]   PATIENT NAME: Cheryl [name]    MR#:  [id number]  DATE OF BIRTH:  June 06, 1951  SUBJECTIVE:   Patient doing better shortness of breath and cough improved  REVIEW OF SYSTEMS:    Review of Systems  Constitutional: Negative for chills and fever.  HENT: Negative for congestion and tinnitus.   Eyes: Negative for blurred vision and double vision.  Respiratory: Positive for cough and shortness of breath. Negative for wheezing.   Cardiovascular: Negative for chest pain, orthopnea and PND.  Gastrointestinal: Negative for abdominal pain, diarrhea, nausea and vomiting.  Genitourinary: Negative for dysuria and hematuria.  Neurological: Negative for dizziness, sensory change and focal weakness.  All other systems reviewed and are negative.   Nutrition: Regular Tolerating Diet: Yes Tolerating PT: Await Eval.   DRUG ALLERGIES:   Allergies  Allergen Reactions  . Percocet [Oxycodone-Acetaminophen] Hives and Rash  . Aspirin Nausea And Vomiting and Other (See Comments)    Reaction:  GI upset   . Codeine Nausea And Vomiting, Other (See Comments) and Nausea Only    Reaction:  GI upset   . Propoxyphene Other (See Comments) and Nausea Only    GI upset Reaction:  GI upset   . Sulfa Antibiotics Rash and Other (See Comments)    Reaction:  GI upset     VITALS:  Blood pressure (!) 143/65, pulse 71, temperature 99.1 F (37.3 C), temperature source Oral, resp. rate 16, height 4\' 10"  (1.473 m), weight 40.8 kg (90 lb), SpO2 96 %.  PHYSICAL EXAMINATION:   Physical Exam  GENERAL:  67 y.o.-year-old thin patient lying in bed in no acute distress.  EYES: Pupils equal, round, reactive to light and accommodation. No scleral icterus. Extraocular muscles intact.  HEENT: Head atraumatic, normocephalic. Oropharynx and nasopharynx clear.  NECK:  Supple, no jugular venous distention. No thyroid enlargement, no tenderness.  LUNGS: Good A/e B/l, minimal  end-exp wheezing, No rales, rhonchi. No use of accessory muscles of respiration.  CARDIOVASCULAR: S1, S2 normal. No murmurs, rubs, or gallops.  ABDOMEN: Soft, nontender, nondistended. Bowel sounds present. No organomegaly or mass.  EXTREMITIES: No cyanosis, clubbing or edema b/l.    NEUROLOGIC: Cranial nerves II through XII are intact. No focal Motor or sensory deficits b/l.  Globally weak PSYCHIATRIC: The patient is alert and oriented x 3.  SKIN: No obvious rash, lesion, or ulcer.    LABORATORY PANEL:   CBC Recent Labs  Lab 06/04/18 0444  WBC 7.6  HGB 7.7*  HCT 22.8*  PLT 269   ------------------------------------------------------------------------------------------------------------------  Chemistries  Recent Labs  Lab 06/01/18 1219  06/04/18 0444  NA 139   < > 141  K 4.0   < > 3.1*  CL 103   < > 109  CO2 27   < > 24  GLUCOSE 97   < > 109*  BUN 32*   < > 17  CREATININE 0.78   < > 0.68  CALCIUM 8.3*   < > 8.4*  AST 17  --   --   ALT 10  --   --   ALKPHOS 61  --   --   BILITOT 0.6  --   --    < > = values in this interval not displayed.   ------------------------------------------------------------------------------------------------------------------  Cardiac Enzymes No results for input(s): TROPONINI in the last 168 hours. ------------------------------------------------------------------------------------------------------------------  RADIOLOGY:  No results found.   ASSESSMENT AND PLAN:   67 year old female with past medical  history of hypertension, hyperlipidemia, coronary artery disease, COPD, GERD, history of anxiety, neuropathy, migraines, chronic pain, recent admission for pneumonia who presents to the hospital due to cough, shortness of breath and noted to have COPD exacerbation secondary to pneumonia.  1.  Acute on chronic respiratory failure with hypoxia-secondary to COPD exacerbation. - Continue IV steroids continue duo nebs, Pulmicort nebs. -  continue cefepime.  Improving -Procalcitonin level in the morning Continue Mucinex    2.  Acute on chronic COPD exacerbation-secondary to pneumonia. - Continue IV steroids, , continue duo nebs, Pulmicort nebs. -Continue empiric IV cefepime.  3.  Pneumonia- patient was recently admitted for pneumonia and had sputum culture positive for Serratia, Klebsiella.  She presented to the hospital due to cough, shortness of breath and had CT chest which was suggestive of multifocal pneumonia. Continue IV cefepime  4.  Anemia acute on chronic-this is a normocytic anemia. - Status post transfusion -Hemoglobin trending trending down I will ask hematology to see no evidence of GI blood loss  5. Anxiety - cont. Xanax.   6.  Chronic pain - cont. Fentanyl patch.   7.  Neuropathy - cont. Lyrica.   8.  Tobacco abuse - cont. Nicotine patch.     All the records are reviewed and case discussed with Care Management/Social Worker. Management plans discussed with the patient, family and they are in agreement.  CODE STATUS: Full code  DVT Prophylaxis: Ted's & SCD's  TOTAL TIME TAKING CARE OF THIS PATIENT: 30 minutes.   POSSIBLE D/C IN 1-2 DAYS, DEPENDING ON CLINICAL CONDITION.   Auburn BilberryShreyang Constancia Geeting M.D on 06/04/2018 at 1:56 PM  Between 7am to 6pm - Pager - [phone]  After 6pm go to [email or web address] - Social research officer, governmentpassword [email or web address]  [facility] [place] [facility]  Office  [phone]  CC: Primary care physician; Malva LimesFisher, Donald E, MD

## 2018-06-05 ENCOUNTER — Telehealth: Payer: Self-pay | Admitting: Oncology

## 2018-06-05 LAB — CBC
HCT: 24.6 % — ABNORMAL LOW (ref 35.0–47.0)
Hemoglobin: 8 g/dL — ABNORMAL LOW (ref 12.0–16.0)
MCH: 27.2 pg (ref 26.0–34.0)
MCHC: 32.5 g/dL (ref 32.0–36.0)
MCV: 83.7 fL (ref 80.0–100.0)
Platelets: 264 10*3/uL (ref 150–440)
RBC: 2.94 MIL/uL — AB (ref 3.80–5.20)
RDW: 20.4 % — ABNORMAL HIGH (ref 11.5–14.5)
WBC: 5.7 10*3/uL (ref 3.6–11.0)

## 2018-06-05 LAB — OCCULT BLOOD X 1 CARD TO LAB, STOOL: FECAL OCCULT BLD: NEGATIVE

## 2018-06-05 MED ORDER — GUAIFENESIN-DM 100-10 MG/5ML PO SYRP: 15.0000 mL | ORAL_SOLUTION | ORAL | 0 refills | Status: DC | PRN

## 2018-06-05 MED ORDER — PREDNISONE 10 MG (21) PO TBPK: ORAL_TABLET | ORAL | 0 refills | Status: DC

## 2018-06-05 MED ORDER — AMOXICILLIN-POT CLAVULANATE 875-125 MG PO TABS: 1.0000 | ORAL_TABLET | Freq: Two times a day (BID) | ORAL | 0 refills | Status: DC

## 2018-06-05 MED ORDER — GUAIFENESIN ER 600 MG PO TB12: 600.0000 mg | ORAL_TABLET | Freq: Two times a day (BID) | ORAL | 0 refills | Status: AC

## 2018-06-05 MED ORDER — FERROUS SULFATE 325 (65 FE) MG PO TABS: 325.0000 mg | ORAL_TABLET | Freq: Two times a day (BID) | ORAL | 3 refills | Status: DC

## 2018-06-05 NOTE — Care Management Note (Signed)
Case Management Note  Patient Details  Name: Sueanne MargaritaMary George Snodgrass MRN: [id number] Date of Birth: [birth year]   Patient  To discharge home with husband.  Resumption orders have been placed. Brad with [facility] notified of discharge.   Subjective/Objective:                    Action/Plan:   Expected Discharge Date:  06/05/18               Expected Discharge Plan:  Home w Home Health Services  In-House Referral:     Discharge planning Services  CM Consult  Post Acute Care Choice:  Resumption of Svcs/PTA Provider, Home Health Choice offered to:     DME Arranged:    DME Agency:     HH Arranged:  RN HH Agency:  [facility]  Status of Service:  Completed, signed off  If discussed at Long Length of Stay Meetings, dates discussed:    Additional Comments:  Chapman FitchBOWEN, Earleen Aoun T, RN 06/05/2018, 10:46 AM

## 2018-06-05 NOTE — Discharge Summary (Signed)
[employer] [facility] [place] at [facility], 67 y.o., DOB 15-Jul-1951, MRN [id number]. Admission date: 06/01/2018 Discharge Date 06/05/2018 Primary MD Birdie Sons, MD Admitting Physician Vaughan Basta, MD  Admission Diagnosis  Cellulitis of left lower extremity [L03.116] Sepsis, due to unspecified organism [facility]) [A41.9] Community acquired pneumonia, unspecified laterality [J18.9]  Discharge Diagnosis   Principal Problem: Acute on chronic respiratory failure with hypoxia Acute on chronic COPD exasperation Multifocal pneumonia Acute on chronic anemia felt to be due to anemia of chronic disease Coronary artery disease GERD  Hospital Course 67 year old female with past medical history of hypertension, hyperlipidemia, coronary artery disease, COPD, GERD, history of anxiety, neuropathy, migraines, chronic pain, recent admission for pneumonia who presents to the hospital due to cough, shortness of breath and noted to have COPD exacerbation secondary to pneumonia.  Patient was admitted to the hospital treated with nebulizer steroids and antibiotics.  Patient started improving now on room air.  She will need outpatient pulmonary follow-up.  She also has chronic anemia that was worsened.  She was seen by hematology she was packed RBCs.  She will need outpatient follow-up and a likely bone marrow biopsy.  Recommend outpatient follow-up chest x-ray to make sure resolution            Consults  hematology/oncology  Significant Tests:  See full reports for all details    Dg Chest 2 View  Result Date: 06/01/2018 CLINICAL DATA:  Fever and productive cough. History of asthma/COPD, CAD and hypertension. EXAM: CHEST - 2 VIEW COMPARISON:  05/25/2018 and 05/16/2018 radiographs. FINDINGS: Persistent patient rotation to the right. The heart size and mediastinal contours are stable. There is overall improved aeration of the right middle lobe and lingula. Chronic  interstitial prominence is otherwise unchanged. There is no edema, confluent airspace opacity, pleural effusion or pneumothorax. Abdominal aortic stent graft is noted. No acute osseous findings are seen. IMPRESSION: Overall improved aeration of both lung bases with otherwise stable generalized interstitial prominence. No acute cardiopulmonary process. Electronically Signed   By: Richardean Sale M.D.   On: 06/01/2018 12:19   Dg Chest 2 View  Result Date: 05/26/2018 CLINICAL DATA:  Cough EXAM: CHEST - 2 VIEW COMPARISON:  05/16/2018 FINDINGS: Cardiac shadow is mildly enlarged. The lungs are well aerated bilaterally with mild interstitial changes stable from the prior exam. No focal infiltrate is seen. Some scarring in the lingula and right middle lobe is noted. No acute bony abnormality is noted. IMPRESSION: Scarring without acute abnormality. Electronically Signed   By: Inez Catalina M.D.   On: 05/26/2018 08:55   Dg Chest 2 View  Result Date: 05/16/2018 CLINICAL DATA:  Cough; Pt. admitted 05-14-18 due to AMS, lethargy, confusion. Suspected to have underlying aspiration pneumonia but chest x-ray has been negative. She did have a low-grade fever admission.Hx/o COPD, CAD, DVTSmoker EXAM: CHEST - 2 VIEW COMPARISON:  05/14/2018 and older exams. FINDINGS: Cardiac silhouette is normal in size. No mediastinal or hilar masses. No convincing adenopathy. Small to moderate hiatal hernia. There is opacity at the posterior left lung base, mildly increased when compared to the study dated 02/08/2018. This may reflect pneumonia or atelectasis, the latter favored. There are prominent bronchovascular markings. No other evidence pneumonia. No evidence of pulmonary edema. No pleural effusion or pneumothorax. Skeletal structures are demineralized but grossly intact. IMPRESSION: 1. Left posterior lower lobe opacity mildly increased when compared to a chest radiograph dated 02/08/2018. This may reflect pneumonia or be due to  atelectasis.  2. No other evidence of acute cardiopulmonary disease. Electronically Signed   By: Lajean Manes M.D.   On: 05/16/2018 13:30   Dg Wrist Complete Left  Result Date: 05/20/2018 CLINICAL DATA:  Left wrist pain EXAM: LEFT WRIST - COMPLETE 3+ VIEW COMPARISON:  04/09/2016 FINDINGS: Osteoarthritis at the base of the thumb metacarpal and triscaphe joints are redemonstrated with progressive degenerative cystic change noted at the base of the thumb metacarpal. Osteoarthritic joint space narrowing is also identified with spurring about the first MCP joint. Carpal rows are maintained. No acute fracture or suspicious osseous lesions. Mild generalized soft tissue swelling is suggested. IMPRESSION: Osteoarthritis of the first MCP, CMC and triscaphe joints, slightly progressed since prior. No acute osseous abnormality. Electronically Signed   By: Ashley Royalty M.D.   On: 05/20/2018 18:38   Ct Chest W Contrast  Result Date: 06/01/2018 CLINICAL DATA:  67 year old presenting with acute onset of fever up to 103 degrees Fahrenheit associated with a productive cough. Patient recently discharged from the hospital with a diagnosis of pneumonia. Hypoxemia upon arrival to the emergency department with oxygen saturation 88% on room air. Crackles in the lung bases on clinical examination. EXAM: CT CHEST WITH CONTRAST TECHNIQUE: Multidetector CT imaging of the chest was performed during intravenous contrast administration. CONTRAST:  27m OMNIPAQUE IOHEXOL 300 MG/ML IV. COMPARISON:  CT chest 06/03/2016 and earlier. Chest x-rays earlier same day, 05/25/2018 and earlier. FINDINGS: Cardiovascular: Heart mildly enlarged, unchanged. Moderate three-vessel coronary atherosclerosis. No pericardial effusion. Moderate to severe atherosclerosis involving the thoracic and UPPER abdominal aorta without evidence of aneurysm. Atherosclerosis at the origin of the LEFT subclavian artery without evidence of hemodynamically significant stenosis.  Atherosclerosis at the origin of the celiac and superior mesenteric arteries without evidence of hemodynamically significant stenosis. Mediastinum/Nodes: Scattered upper normal sized mediastinal lymph nodes, the largest in the subcarinal region (station 7) measuring approximately 1.6 x 1.7 cm, unchanged from prior examinations. No new or enlarging lymphadenopathy. Large hiatal hernia. Normal appearing esophagus. Normal-appearing thyroid gland. Lungs/Pleura: Marked hyperinflation with emphysematous changes throughout both lungs. Peripheral blebs in the deep LATERAL RIGHT lower lobe as noted previously. Airspace opacities throughout both lungs with a nodular acinar pattern, involving the RIGHT upper lobe, RIGHT lower lobe and LEFT lower lobe. The greatest involvement is in the RIGHT lower lobe. Linear scarring in the lingula, RIGHT middle lobe and both lower lobes, unchanged. No pleural effusions. Central airways patent with marked central bronchial wall thickening. Upper Abdomen: Possible hepatomegaly, though the liver is incompletely imaged. Anatomic variant in that the LEFT lobe of the liver extends well across the midline into the LEFT UPPER quadrant. Apart from the large hiatal hernia, no significant findings elsewhere in the visualized upper abdomen. Musculoskeletal: Severe degenerative disc disease and spondylosis at C6-7, incompletely imaged. Osseous demineralization. Mild to moderate degenerative disc disease and spondylosis involving the mid and LOWER thoracic spine. Remote fractures involving the RIGHT POSTERIOR eighth rib and the LEFT LATERAL ninth rib. No acute findings. IMPRESSION: 1. Patchy pneumonia involving the BILATERAL lower lobes and the RIGHT upper lobe, with the greatest involvement in the RIGHT lower lobe. Marked central peribronchial thickening indicating asthma and/or bronchitis. 2. Stable mild reactive mediastinal lymphadenopathy. No new or enlarging lymphadenopathy. 3. Large hiatal hernia.  4. Possible hepatomegaly, though the liver is incompletely imaged. 5. Stable mild cardiomegaly. Moderate three-vessel coronary atherosclerosis. Aortic Atherosclerosis (ICD10-I70.0) and Emphysema (ICD10-J43.9). Electronically Signed   By: TEvangeline DakinM.D.   On: 06/01/2018 14:11   Mr Brain WLottie Dawson  Contrast  Result Date: 05/18/2018 CLINICAL DATA:  Lethargy and confusion, which began recently. EXAM: MRI HEAD WITHOUT CONTRAST TECHNIQUE: Multiplanar, multiecho pulse sequences of the brain and surrounding structures were obtained without intravenous contrast. COMPARISON:  CT head 09/12/2017.  MR head 11/26/2012. FINDINGS: Brain: No evidence for acute infarction, hemorrhage, mass lesion, hydrocephalus, or extra-axial fluid. Normal cerebral volume. Moderately advanced T2 and FLAIR hyperintensities throughout the white matter, likely small vessel disease. Vascular: Flow voids are maintained throughout the carotid, basilar, and vertebral arteries. There are no areas of chronic hemorrhage. Skull and upper cervical spine: Unremarkable visualized calvarium, skullbase, and cervical vertebrae. Pituitary, pineal, cerebellar tonsils unremarkable. No upper cervical cord lesions. Sinuses/Orbits: No orbital masses or proptosis. Globes appear symmetric. Sinuses appear well aerated, without evidence for air-fluid level. Other: None. IMPRESSION: Normal for age cerebral volume. Moderately advanced small vessel disease, similar to prior MR. No acute intracranial findings. Electronically Signed   By: Staci Righter M.D.   On: 05/18/2018 14:52   Dg Chest Port 1 View  Result Date: 05/14/2018 CLINICAL DATA:  Hypoxia. EXAM: PORTABLE CHEST 1 VIEW COMPARISON:  Radiographs of February 08, 2018. FINDINGS: The heart size and mediastinal contours are within normal limits. Both lungs are clear. Stable hiatal hernia is noted. No pneumothorax or pleural effusion is noted. Old right posterior rib fracture is noted. IMPRESSION: No acute cardiopulmonary  abnormality seen.  Stable hiatal hernia. Electronically Signed   By: Marijo Conception, M.D.   On: 05/14/2018 10:50       Today   Subjective:   Cheryl [name] patient doing much better breathing much improved Objective:   Blood pressure (!) 156/78, pulse 67, temperature 98.3 F (36.8 C), temperature source Oral, resp. rate 20, height '4\' 10"'$  (1.473 m), weight 40.8 kg (90 lb), SpO2 95 %.  .  Intake/Output Summary (Last 24 hours) at 06/05/2018 1355 Last data filed at 06/05/2018 1041 Gross per 24 hour  Intake 560 ml  Output 400 ml  Net 160 ml    Exam VITAL SIGNS: Blood pressure (!) 156/78, pulse 67, temperature 98.3 F (36.8 C), temperature source Oral, resp. rate 20, height '4\' 10"'$  (1.473 m), weight 40.8 kg (90 lb), SpO2 95 %.  GENERAL:  67 y.o.-year-old patient lying in the bed with no acute distress.  EYES: Pupils equal, round, reactive to light and accommodation. No scleral icterus. Extraocular muscles intact.  HEENT: Head atraumatic, normocephalic. Oropharynx and nasopharynx clear.  NECK:  Supple, no jugular venous distention. No thyroid enlargement, no tenderness.  LUNGS: Normal breath sounds bilaterally, no wheezing, rales,rhonchi or crepitation. No use of accessory muscles of respiration.  CARDIOVASCULAR: S1, S2 normal. No murmurs, rubs, or gallops.  ABDOMEN: Soft, nontender, nondistended. Bowel sounds present. No organomegaly or mass.  EXTREMITIES: No pedal edema, cyanosis, or clubbing.  NEUROLOGIC: Cranial nerves II through XII are intact. Muscle strength 5/5 in all extremities. Sensation intact. Gait not checked.  PSYCHIATRIC: The patient is alert and oriented x 3.  SKIN: No obvious rash, lesion, or ulcer.   Data Review     CBC w Diff:  Lab Results  Component Value Date   WBC 5.7 06/05/2018   HGB 8.0 (L) 06/05/2018   HGB 11.0 (L) 04/09/2016   HCT 24.6 (L) 06/05/2018   HCT 33.6 (L) 04/09/2016   PLT 264 06/05/2018   PLT 296 04/09/2016   LYMPHOPCT 5 06/01/2018    LYMPHOPCT 11.5 08/23/2014   MONOPCT 6 06/01/2018   MONOPCT 5.3 08/23/2014   EOSPCT 0 06/01/2018  EOSPCT 0.9 08/23/2014   BASOPCT 1 06/01/2018   BASOPCT 0.6 08/23/2014   CMP:  Lab Results  Component Value Date   NA 141 06/04/2018   NA 144 04/21/2018   NA 140 08/22/2014   K 3.1 (L) 06/04/2018   K 4.2 08/22/2014   CL 109 06/04/2018   CL 113 (H) 08/22/2014   CO2 24 06/04/2018   CO2 21 08/22/2014   BUN 17 06/04/2018   BUN 16 04/21/2018   BUN 8 08/22/2014   CREATININE 0.68 06/04/2018   CREATININE 0.68 08/22/2014   GLU 83 07/06/2014   PROT 6.2 (L) 06/01/2018   PROT 6.7 01/17/2016   PROT 6.4 08/20/2014   ALBUMIN 3.1 (L) 06/01/2018   ALBUMIN 3.7 04/21/2018   ALBUMIN 2.8 (L) 08/20/2014   BILITOT 0.6 06/01/2018   BILITOT 0.2 01/17/2016   BILITOT 0.4 08/20/2014   ALKPHOS 61 06/01/2018   ALKPHOS 111 08/20/2014   AST 17 06/01/2018   AST 21 08/20/2014   ALT 10 06/01/2018   ALT 20 08/20/2014  .  Micro Results Recent Results (from the past 240 hour(s))  Culture, blood (Routine x 2)     Status: None (Preliminary result)   Collection Time: 06/01/18 12:19 PM  Result Value Ref Range Status   Specimen Description BLOOD LEFT ASSIST CONTROL  Final   Special Requests   Final    BOTTLES DRAWN AEROBIC AND ANAEROBIC Blood Culture adequate volume   Culture   Final    NO GROWTH 4 DAYS Performed at [facility], [address] [place], [place] [address]    Report Status PENDING  Incomplete  Culture, blood (Routine x 2)     Status: None (Preliminary result)   Collection Time: 06/01/18 12:20 PM  Result Value Ref Range Status   Specimen Description BLOOD RIGHT ASSIST CONTROL  Final   Special Requests   Final    BOTTLES DRAWN AEROBIC AND ANAEROBIC Blood Culture adequate volume   Culture   Final    NO GROWTH 4 DAYS Performed at [facility], [address], [place], [place] [address]    Report Status PENDING  Incomplete  MRSA PCR Screening     Status: None    Collection Time: 06/01/18  3:07 PM  Result Value Ref Range Status   MRSA by PCR NEGATIVE NEGATIVE Final    Comment:        The GeneXpert MRSA Assay (FDA approved for NASAL specimens only), is one component of a comprehensive MRSA colonization surveillance program. It is not intended to diagnose MRSA infection nor to guide or monitor treatment for MRSA infections. Performed at [facility], [address] [place], [place] [address]   Respiratory Panel by PCR     Status: None   Collection Time: 06/01/18  4:40 PM  Result Value Ref Range Status   Adenovirus NOT DETECTED NOT DETECTED Final   Coronavirus 229E NOT DETECTED NOT DETECTED Final   Coronavirus HKU1 NOT DETECTED NOT DETECTED Final   Coronavirus NL63 NOT DETECTED NOT DETECTED Final   Coronavirus OC43 NOT DETECTED NOT DETECTED Final   Metapneumovirus NOT DETECTED NOT DETECTED Final   Rhinovirus / Enterovirus NOT DETECTED NOT DETECTED Final   Influenza A NOT DETECTED NOT DETECTED Final   Influenza B NOT DETECTED NOT DETECTED Final   Parainfluenza Virus 1 NOT DETECTED NOT DETECTED Final   Parainfluenza Virus 2 NOT DETECTED NOT DETECTED Final   Parainfluenza Virus 3 NOT DETECTED NOT DETECTED Final   Parainfluenza  Virus 4 NOT DETECTED NOT DETECTED Final   Respiratory Syncytial Virus NOT DETECTED NOT DETECTED Final   Bordetella pertussis NOT DETECTED NOT DETECTED Final   Chlamydophila pneumoniae NOT DETECTED NOT DETECTED Final   Mycoplasma pneumoniae NOT DETECTED NOT DETECTED Final    Comment: Performed at [facility], [place] [address], [place], [place] [address]        Code Status Orders  (From admission, onward)        Start     Ordered   06/01/18 1536  Full code  Continuous     06/01/18 1535    Code Status History    Date Active Date Inactive Code Status Order ID Comments User Context   05/14/2018 1351 05/21/2018 1558 Partial Code [id number]  Saundra Shelling, MD ED   02/08/2018 1835 02/09/2018 1920 Full  Code [id number]  Vaughan Basta, MD Inpatient   02/05/2018 2025 02/07/2018 2119 Full Code [id number]  Hillary Bow, MD ED   04/29/2017 1314 05/02/2017 2148 Full Code [id number]  Epifanio Lesches, MD ED   12/23/2016 1745 12/25/2016 1849 Full Code [id number]  Theodoro Grist, MD ED   11/06/2016 0900 11/07/2016 1853 Full Code [id number]  Max Sane, MD Inpatient   11/05/2016 1401 11/06/2016 0900 Partial Code [id number]  Bettey Costa, MD Inpatient   06/03/2016 0253 06/05/2016 1945 Full Code [id number]  Quintella Baton, MD Inpatient   11/19/2015 1218 11/24/2015 1624 Full Code [id number]  Demetrios Loll, MD Inpatient   11/09/2015 2302 11/13/2015 1851 Full Code [id number]  Lytle Butte, MD ED   10/17/2015 2226 10/22/2015 1303 Full Code [id number]  Fritzi Mandes, MD Inpatient   10/09/2015 1738 10/12/2015 1934 Full Code [id number]  Demetrios Loll, MD Inpatient   08/01/2015 0008 08/03/2015 1454 Full Code [id number]  Lance Coon, MD Inpatient    Advance Directive Documentation     Most Recent Value  Type of Advance Directive  Living will  Pre-existing out of facility DNR order (yellow form or pink MOST form)  -  "MOST" Form in Place?  -          Follow-up Information    Birdie Sons, MD. Go on 06/15/2018.   Specialty:  Family Medicine Why:  '@3p'$  Contact information: [address] [place] [address] [phone]        Erby Pian, MD In 1 week.   Specialty:  Specialist Why:  recurrent pna  NEEDS REFERRAL FROM PCP Contact information: [place] [address] [phone]        Sindy Guadeloupe, MD In 10 days.   Specialty:  Oncology Why:  anemia  OFFICE WILL CALL YOU WITH APPT Contact information: [place] [address] [phone]           Discharge Medications   Allergies as of 06/05/2018      Reactions   Percocet [oxycodone-acetaminophen] Hives, Rash   Aspirin Nausea And Vomiting, Other (See Comments)   Reaction:  GI  upset    Codeine Nausea And Vomiting, Other (See Comments), Nausea Only   Reaction:  GI upset    Propoxyphene Other (See Comments), Nausea Only   GI upset Reaction:  GI upset    Sulfa Antibiotics Rash, Other (See Comments)   Reaction:  GI upset       Medication List    TAKE these medications   acetaminophen 325 MG tablet Commonly known as:  TYLENOL Take 2 tablets (650 mg total) by mouth every 6 (six)  hours as needed for mild pain (or Fever >/= 101).   acetaminophen 325 MG tablet Commonly known as:  TYLENOL Take 2 tablets (650 mg total) by mouth every 6 (six) hours as needed for mild pain (or Fever >/= 101).   albuterol (2.5 MG/3ML) 0.083% nebulizer solution Commonly known as:  PROVENTIL Take 3 mLs (2.5 mg total) by nebulization every 4 (four) hours as needed for wheezing.   alendronate 70 MG tablet Commonly known as:  FOSAMAX Take 1 tablet (70 mg total) by mouth every 7 (seven) days. Take with a full glass of water on an empty stomach.   ALPRAZolam 0.5 MG tablet Commonly known as:  XANAX Take 0.5 tablets (0.25 mg total) by mouth at bedtime as needed for anxiety. What changed:  how much to take   amLODipine 5 MG tablet Commonly known as:  NORVASC Take 1 tablet (5 mg total) by mouth daily.   amoxicillin-clavulanate 875-125 MG tablet Commonly known as:  AUGMENTIN Take 1 tablet by mouth 2 (two) times daily for 14 days.   clopidogrel 75 MG tablet Commonly known as:  PLAVIX Take 75 mg by mouth daily.   cyclobenzaprine 5 MG tablet Commonly known as:  FLEXERIL Take 1 tablet (5 mg total) by mouth 3 (three) times daily as needed for muscle spasms.   donepezil 10 MG tablet Commonly known as:  ARICEPT Take 10 mg by mouth at bedtime.   DULoxetine 30 MG capsule Commonly known as:  CYMBALTA Take 1 capsule by mouth daily.   estradiol 0.5 MG tablet Commonly known as:  ESTRACE Take 1 tablet (0.5 mg total) by mouth daily. (Take in place of estropipate) What changed:     when to take this  additional instructions   famotidine 20 MG tablet Commonly known as:  PEPCID Take 1 tablet (20 mg total) by mouth daily.   fentaNYL 50 MCG/HR Commonly known as:  DURAGESIC - dosed mcg/hr Place 1 patch (50 mcg total) onto the skin every 3 (three) days. Start taking on:  07/21/2018   ferrous sulfate 325 (65 FE) MG tablet Take 1 tablet (325 mg total) by mouth 2 (two) times daily with a meal.   Fluticasone-Salmeterol 250-50 MCG/DOSE Aepb Commonly known as:  ADVAIR DISKUS inhale 1 dose by mouth twice a day   guaiFENesin 600 MG 12 hr tablet Commonly known as:  MUCINEX Take 1 tablet (600 mg total) by mouth 2 (two) times daily for 7 days.   guaiFENesin-dextromethorphan 100-10 MG/5ML syrup Commonly known as:  ROBITUSSIN DM Take 15 mLs by mouth every 4 (four) hours as needed for cough.   NARCAN 4 MG/0.1ML Liqd nasal spray kit Generic drug:  naloxone Place 0.4 mg into the nose once.   nicotine 21 mg/24hr patch Commonly known as:  NICODERM CQ - dosed in mg/24 hours Place 1 patch (21 mg total) onto the skin daily. Okay to substitute generic patch   ondansetron 4 MG tablet Commonly known as:  ZOFRAN Take 0.5-1 tablets (2-4 mg total) by mouth every 6 (six) hours as needed for nausea. What changed:    how much to take  when to take this   pantoprazole 40 MG tablet Commonly known as:  PROTONIX Take 1 tablet (40 mg total) by mouth 2 (two) times daily.   predniSONE 10 MG (21) Tbpk tablet Commonly known as:  STERAPRED UNI-PAK 21 TAB Start at '60mg'$  taper by '10mg'$  until finished   pregabalin 150 MG capsule Commonly known as:  LYRICA Take 150 mg  by mouth 2 (two) times daily. What changed:  Another medication with the same name was removed. Continue taking this medication, and follow the directions you see here.   simvastatin 10 MG tablet Commonly known as:  ZOCOR Take 10 mg by mouth at bedtime.   tiotropium 18 MCG inhalation capsule Commonly known as:   SPIRIVA HANDIHALER inhale the contents of one capsule in the handihaler once daily   traZODone 50 MG tablet Commonly known as:  DESYREL Take 1-2 tablets (50-100 mg total) by mouth at bedtime.   triamcinolone ointment 0.5 % Commonly known as:  KENALOG Apply to lesions on hands twice a day as needed   vitamin B-12 1000 MCG tablet Commonly known as:  CYANOCOBALAMIN Take 1 tablet (1,000 mcg total) by mouth daily.          Total Time in preparing paper work, data evaluation and todays exam - 63 minutes  Dustin Flock M.D on 06/05/2018 at [place]  [phone]

## 2018-06-05 NOTE — Telephone Encounter (Signed)
Appts schd and conf w patient. Appt also mailed.

## 2018-06-05 NOTE — Telephone Encounter (Signed)
Post Hosp discharge F/U in 7-10 days w Labs (CBC w Diff) / Dr. Smith Robertao + Possible **NEW Duncan DullFERAHEME**. Ref by Auburn BilberryPatel, Shreyang, MD. Per Dr. Rao/schd msg. MF

## 2018-06-05 NOTE — Progress Notes (Signed)
Patient discharge teaching given, including activity, diet, follow-up appoints, and medications. Patient verbalized understanding of all discharge instructions. IV access was d/c'd. Vitals are stable. Skin is intact except as charted in most recent assessments. Pt to be escorted out by NT, to be driven home by family.  Cheryl [name]  

## 2018-06-05 NOTE — Progress Notes (Signed)
Pharmacy Antibiotic Note  Cheryl [name] is a 67 y.o. female admitted on 06/01/2018 with sepsis.  Pharmacy has been consulted for cefepime dosing. She was recently discharged after being treated here for pneumonia, primarily with Unasyn.  Plan: cefepime dose is 2 grams IV every 12 hours.  Height: 4\' 10"  (147.3 cm) Weight: 90 lb (40.8 kg) IBW/kg (Calculated) : 40.9  Temp (24hrs), Avg:98.7 F (37.1 C), Min:98.3 F (36.8 C), Max:99.1 F (37.3 C)  Recent Labs  Lab 06/01/18 1219 06/02/18 0431 06/03/18 0645 06/04/18 0444 06/05/18 0536  WBC 22.3* 13.8* 11.0 7.6 5.7  CREATININE 0.78 0.66 0.80 0.68  --   LATICACIDVEN 1.0  --   --   --   --     Estimated Creatinine Clearance: 42.7 mL/min (by C-G formula based on SCr of 0.68 mg/dL).    Allergies  Allergen Reactions  . Percocet [Oxycodone-Acetaminophen] Hives and Rash  . Aspirin Nausea And Vomiting and Other (See Comments)    Reaction:  GI upset   . Codeine Nausea And Vomiting, Other (See Comments) and Nausea Only    Reaction:  GI upset   . Propoxyphene Other (See Comments) and Nausea Only    GI upset Reaction:  GI upset   . Sulfa Antibiotics Rash and Other (See Comments)    Reaction:  GI upset     Antimicrobials this admission: Azithromycin 7/8 Ceftriaxone 7/8 Vancomycin 7/8>>7/9 Cefepime 7/8 >>  Microbiology results: 7/8 BCx: pending 7/9 MRSA PCR: neg resp cx 7/8: NG  Thank you for allowing pharmacy to be a part of this patient's care.  Lowella Bandyodney D [name], PharmD 06/05/2018 9:10 AM

## 2018-06-06 DIAGNOSIS — G8929 Other chronic pain: Secondary | ICD-10-CM | POA: Diagnosis not present

## 2018-06-06 DIAGNOSIS — F17210 Nicotine dependence, cigarettes, uncomplicated: Secondary | ICD-10-CM | POA: Diagnosis not present

## 2018-06-06 DIAGNOSIS — I739 Peripheral vascular disease, unspecified: Secondary | ICD-10-CM | POA: Diagnosis not present

## 2018-06-06 DIAGNOSIS — G609 Hereditary and idiopathic neuropathy, unspecified: Secondary | ICD-10-CM | POA: Diagnosis not present

## 2018-06-06 DIAGNOSIS — L03116 Cellulitis of left lower limb: Secondary | ICD-10-CM | POA: Diagnosis not present

## 2018-06-06 DIAGNOSIS — J441 Chronic obstructive pulmonary disease with (acute) exacerbation: Secondary | ICD-10-CM | POA: Diagnosis not present

## 2018-06-06 DIAGNOSIS — I2510 Atherosclerotic heart disease of native coronary artery without angina pectoris: Secondary | ICD-10-CM | POA: Diagnosis not present

## 2018-06-06 DIAGNOSIS — I10 Essential (primary) hypertension: Secondary | ICD-10-CM | POA: Diagnosis not present

## 2018-06-06 DIAGNOSIS — M4180 Other forms of scoliosis, site unspecified: Secondary | ICD-10-CM | POA: Diagnosis not present

## 2018-06-06 LAB — CULTURE, BLOOD (ROUTINE X 2)
CULTURE: NO GROWTH
CULTURE: NO GROWTH
Special Requests: ADEQUATE
Special Requests: ADEQUATE

## 2018-06-07 LAB — MULTIPLE MYELOMA PANEL, SERUM
ALPHA 1: 0.3 g/dL (ref 0.0–0.4)
ALPHA2 GLOB SERPL ELPH-MCNC: 0.9 g/dL (ref 0.4–1.0)
Albumin SerPl Elph-Mcnc: 2.7 g/dL — ABNORMAL LOW (ref 2.9–4.4)
Albumin/Glob SerPl: 1.1 (ref 0.7–1.7)
B-Globulin SerPl Elph-Mcnc: 0.9 g/dL (ref 0.7–1.3)
Gamma Glob SerPl Elph-Mcnc: 0.7 g/dL (ref 0.4–1.8)
Globulin, Total: 2.7 g/dL (ref 2.2–3.9)
IgA: 165 mg/dL (ref 87–352)
IgG (Immunoglobin G), Serum: 695 mg/dL — ABNORMAL LOW (ref 700–1600)
IgM (Immunoglobulin M), Srm: 85 mg/dL (ref 26–217)
Total Protein ELP: 5.4 g/dL — ABNORMAL LOW (ref 6.0–8.5)

## 2018-06-08 ENCOUNTER — Other Ambulatory Visit: Payer: Self-pay | Admitting: Family Medicine

## 2018-06-08 ENCOUNTER — Telehealth: Payer: Self-pay | Admitting: Family Medicine

## 2018-06-08 ENCOUNTER — Other Ambulatory Visit: Payer: Self-pay | Admitting: *Deleted

## 2018-06-08 ENCOUNTER — Telehealth: Payer: Self-pay

## 2018-06-08 LAB — KAPPA/LAMBDA LIGHT CHAINS
KAPPA FREE LGHT CHN: 13.2 mg/L (ref 3.3–19.4)
Kappa, lambda light chain ratio: 0.92 (ref 0.26–1.65)
Lambda free light chains: 14.3 mg/L (ref 5.7–26.3)

## 2018-06-08 NOTE — Telephone Encounter (Signed)
That's fine

## 2018-06-08 NOTE — Telephone Encounter (Signed)
Transition Care Management Follow-up Telephone Call  How have you been since you were released from the hospital? Pt states she was up vomiting all last night and is unsure the cause. Pt wanted to know if this may be realted to the new abx use. Also she has had diarrhea the past 2-3 days. No other new or worsening s/s.  Do you understand why you were in the hospital? yes  Do you have a copy of your discharge instructions Yes Do you understand the discharge instrcutions? yes  Where were you discharged to? Home  Do you have support at home? Yes    Items Reviewed:  Medications obtained No, pt stated she was not home and unable to clarify.  Medications reviewed: No, pt declined.  Dietary changes reviewed: yes, low sodium heart healthy diet.  Home Health? Yes- [facility].  DME ordered at discharge obtained? No  Medical supplies: No    Functional Questionnaire:   Activities of Daily Living (ADLs):   She states they are independent in the following: ambulation, bathing and hygiene, feeding, continence, grooming, toileting, dressing and medication management States they require assistance with the following: cleaning home  Any transportation issues/concerns?: no  Any patient concerns? yes, what is causing the vomiting and diarrhea.  Confirmed importance and date/time of follow-up visits scheduled with PCP: yes, 06/15/18 @ 3 PM.   Confirm appointment scheduled with specialist? Yes, pt sees GI tomorrow.  Confirmed with patient if condition begins to worsen call PCP or If it's emergency go to the ER.

## 2018-06-08 NOTE — Telephone Encounter (Signed)
Cheryl LeashDonna with [facility] is requesting verbal orders to continue current orders for in home nursing care orders for the next 2 weeks until pt is due for re-cert. Please advise. Thanks TNP

## 2018-06-08 NOTE — Telephone Encounter (Signed)
Lupita LeashDonna advised as below.

## 2018-06-08 NOTE — Telephone Encounter (Signed)
Please advise 

## 2018-06-08 NOTE — Patient Outreach (Signed)
[facility]) Care Management  06/08/2018  Cheryl [name] 03-03-1951 [id number]  Referral from : Inpatient [facility] Liaison  Referral reason: Admission 6/20-6/27 Dx: Hypoxia,AMS, Pneumonia, abdominal pain . Insurance: [facility] readmission  7/8-7/12, Dx:: Acute on chronic respiratory failure with hypoxia, Acute on chronic COPD exacerbation , Anemia   Transition of care by PCP office   Patient is 67 year old female with PMH that includes but not limited to : COPD, GERD,DVT, CAD, hyperlipidemia, neuropathy, chronic pain, falls,.  Successful Outreach call to patient , explained reason for the call, HIPAA verified. Patient gave verbal agreement for [facility] care management services.   Patient discussed her recent hospital admission, discussed her problem with anemia and pneumonia.  Patient reports that she was nauseated on last night but no vomiting , denies feeling nauseated on this morning and tolerating eating crackers this morning.  Patient reports wearing her oxygen during the night when she was feeling sick but does not need it today. Denies increase shortness of breath, cough ,fever on today.  Patient discussed [facility] visited on this am and plans to start physical therapy. Patient reports she wears her oxygen if she feels short of breath not just going by the number on the pulse oximeter, patient states she carries portable oxygen tank when they leave home. . Patient reports support at home includes her husband, granddaughter and her sister visit frequently.  Patient discussed her upcoming MD appointments, plans to see her GI MD on tomorrow and PCP on next week.  Patient reports that her husband keeps track of all her medications, she is unable to review list at this time as he is not at home, she states he fills her pill organization as indicated by the discharge list.    Plan Will follow patient in complex care management program for COPD. Patient  agreeable to home visit in the next week.  Reiterated signs of worsening of COPD to notify MD of ED for urgent red zone symptoms. Provide [facility] contact numbers for outreach .  Will send PCP barrier letter.     [facility] CM Care Plan Problem One     Most Recent Value  Care Plan Problem One  At risk for readmission related to 2 admits related to COPD, pneumonia, anemia   Role Documenting the Problem One  Care Management Coordinator  Care Plan for Problem One  Active  [facility] Long Term Goal   Patient will not have any readmissions within the next 90 days.   [facility] Long Term Goal Start Date  06/08/18  Interventions for Problem One Long Term Goal  Reviewed current clinical state, symptoms of worsening of respiratory condiiton , yellow zone symptoms , discussed signs of Anemia , increased weakness, shortness of breath,    [facility] CM Short Term Goal #1   Patient will be able to report worsening symptoms of COPD, Pneumonia in the next 30 days   [facility] CM Short Term Goal #1 Start Date  06/08/18  [facility] CM Short Term Goal #1 Met Date  06/08/18  Interventions for Short Term Goal #1  RN reviewed signs of pneumonia, fever, increased cough, change in sputum color , increased weakness encouraged to notify MD sooner , seek medical attention   [facility] CM Short Term Goal #2   Patient will attend all medical appointment in the next 30 days   [facility] CM Short Term Goal #2 Start Date  06/08/18  Interventions for Short Term Goal #2  Reviewed upcoming appointments with patient, advised regarding keeping all appointments, discussed transportation.       Joylene Draft, RN, [place] Management Coordinator  [phone]- Mobile [phone]- Toll Free Main Office

## 2018-06-09 ENCOUNTER — Telehealth: Payer: Self-pay | Admitting: Family Medicine

## 2018-06-09 ENCOUNTER — Other Ambulatory Visit: Payer: Self-pay | Admitting: Oncology

## 2018-06-09 DIAGNOSIS — K529 Noninfective gastroenteritis and colitis, unspecified: Secondary | ICD-10-CM | POA: Diagnosis not present

## 2018-06-09 DIAGNOSIS — L03116 Cellulitis of left lower limb: Secondary | ICD-10-CM | POA: Diagnosis not present

## 2018-06-09 DIAGNOSIS — Z8719 Personal history of other diseases of the digestive system: Secondary | ICD-10-CM | POA: Diagnosis not present

## 2018-06-09 DIAGNOSIS — D509 Iron deficiency anemia, unspecified: Secondary | ICD-10-CM

## 2018-06-09 DIAGNOSIS — I2510 Atherosclerotic heart disease of native coronary artery without angina pectoris: Secondary | ICD-10-CM | POA: Diagnosis not present

## 2018-06-09 DIAGNOSIS — G609 Hereditary and idiopathic neuropathy, unspecified: Secondary | ICD-10-CM | POA: Diagnosis not present

## 2018-06-09 DIAGNOSIS — F17210 Nicotine dependence, cigarettes, uncomplicated: Secondary | ICD-10-CM | POA: Diagnosis not present

## 2018-06-09 DIAGNOSIS — R110 Nausea: Secondary | ICD-10-CM | POA: Diagnosis not present

## 2018-06-09 DIAGNOSIS — G8929 Other chronic pain: Secondary | ICD-10-CM | POA: Diagnosis not present

## 2018-06-09 DIAGNOSIS — M4180 Other forms of scoliosis, site unspecified: Secondary | ICD-10-CM | POA: Diagnosis not present

## 2018-06-09 DIAGNOSIS — J441 Chronic obstructive pulmonary disease with (acute) exacerbation: Secondary | ICD-10-CM | POA: Diagnosis not present

## 2018-06-09 DIAGNOSIS — I739 Peripheral vascular disease, unspecified: Secondary | ICD-10-CM | POA: Diagnosis not present

## 2018-06-09 DIAGNOSIS — I10 Essential (primary) hypertension: Secondary | ICD-10-CM | POA: Diagnosis not present

## 2018-06-09 NOTE — Telephone Encounter (Signed)
Please advise 

## 2018-06-09 NOTE — Telephone Encounter (Signed)
[name] was given verbal orders.

## 2018-06-09 NOTE — Telephone Encounter (Signed)
That's fine

## 2018-06-09 NOTE — Telephone Encounter (Signed)
[name] with [facility] is requesting the following:  1. Verbal orders for in home nursing visits  - twice a week for 2 weeks  - once every other week for 7 weeks 2. Verbal orders for physical therapy eval and treat  Please advise. Thanks TNP

## 2018-06-11 ENCOUNTER — Ambulatory Visit: Payer: Self-pay | Admitting: Family Medicine

## 2018-06-11 ENCOUNTER — Other Ambulatory Visit: Payer: Self-pay

## 2018-06-11 DIAGNOSIS — D509 Iron deficiency anemia, unspecified: Secondary | ICD-10-CM

## 2018-06-12 ENCOUNTER — Other Ambulatory Visit: Payer: Self-pay

## 2018-06-12 ENCOUNTER — Inpatient Hospital Stay: Payer: Medicare HMO | Attending: Oncology

## 2018-06-12 ENCOUNTER — Inpatient Hospital Stay: Payer: Medicare HMO

## 2018-06-12 ENCOUNTER — Inpatient Hospital Stay (HOSPITAL_BASED_OUTPATIENT_CLINIC_OR_DEPARTMENT_OTHER): Payer: Medicare HMO | Admitting: Oncology

## 2018-06-12 VITALS — BP 167/70 | HR 85 | Temp 97.0°F | Resp 22 | Ht <= 58 in | Wt 111.0 lb

## 2018-06-12 DIAGNOSIS — D509 Iron deficiency anemia, unspecified: Secondary | ICD-10-CM

## 2018-06-12 DIAGNOSIS — I10 Essential (primary) hypertension: Secondary | ICD-10-CM

## 2018-06-12 DIAGNOSIS — K449 Diaphragmatic hernia without obstruction or gangrene: Secondary | ICD-10-CM | POA: Insufficient documentation

## 2018-06-12 DIAGNOSIS — E785 Hyperlipidemia, unspecified: Secondary | ICD-10-CM | POA: Diagnosis not present

## 2018-06-12 DIAGNOSIS — G8929 Other chronic pain: Secondary | ICD-10-CM | POA: Diagnosis not present

## 2018-06-12 DIAGNOSIS — M810 Age-related osteoporosis without current pathological fracture: Secondary | ICD-10-CM | POA: Diagnosis not present

## 2018-06-12 DIAGNOSIS — Z809 Family history of malignant neoplasm, unspecified: Secondary | ICD-10-CM | POA: Insufficient documentation

## 2018-06-12 DIAGNOSIS — I739 Peripheral vascular disease, unspecified: Secondary | ICD-10-CM

## 2018-06-12 DIAGNOSIS — F17210 Nicotine dependence, cigarettes, uncomplicated: Secondary | ICD-10-CM | POA: Diagnosis not present

## 2018-06-12 DIAGNOSIS — M129 Arthropathy, unspecified: Secondary | ICD-10-CM | POA: Insufficient documentation

## 2018-06-12 DIAGNOSIS — K219 Gastro-esophageal reflux disease without esophagitis: Secondary | ICD-10-CM | POA: Diagnosis not present

## 2018-06-12 DIAGNOSIS — F419 Anxiety disorder, unspecified: Secondary | ICD-10-CM

## 2018-06-12 DIAGNOSIS — G629 Polyneuropathy, unspecified: Secondary | ICD-10-CM

## 2018-06-12 DIAGNOSIS — Z8701 Personal history of pneumonia (recurrent): Secondary | ICD-10-CM | POA: Insufficient documentation

## 2018-06-12 DIAGNOSIS — E559 Vitamin D deficiency, unspecified: Secondary | ICD-10-CM | POA: Insufficient documentation

## 2018-06-12 DIAGNOSIS — J449 Chronic obstructive pulmonary disease, unspecified: Secondary | ICD-10-CM

## 2018-06-12 DIAGNOSIS — Z79899 Other long term (current) drug therapy: Secondary | ICD-10-CM | POA: Diagnosis not present

## 2018-06-12 DIAGNOSIS — I2510 Atherosclerotic heart disease of native coronary artery without angina pectoris: Secondary | ICD-10-CM | POA: Diagnosis not present

## 2018-06-12 DIAGNOSIS — Z801 Family history of malignant neoplasm of trachea, bronchus and lung: Secondary | ICD-10-CM | POA: Diagnosis not present

## 2018-06-12 DIAGNOSIS — Z86718 Personal history of other venous thrombosis and embolism: Secondary | ICD-10-CM | POA: Diagnosis not present

## 2018-06-12 DIAGNOSIS — D638 Anemia in other chronic diseases classified elsewhere: Secondary | ICD-10-CM

## 2018-06-12 LAB — CBC WITH DIFFERENTIAL/PLATELET
BASOS ABS: 0.1 10*3/uL (ref 0–0.1)
BASOS PCT: 1 %
Eosinophils Absolute: 0.2 10*3/uL (ref 0–0.7)
Eosinophils Relative: 2 %
HEMATOCRIT: 26.3 % — AB (ref 35.0–47.0)
HEMOGLOBIN: 8.5 g/dL — AB (ref 12.0–16.0)
LYMPHS PCT: 19 %
Lymphs Abs: 1.9 10*3/uL (ref 1.0–3.6)
MCH: 28.5 pg (ref 26.0–34.0)
MCHC: 32.3 g/dL (ref 32.0–36.0)
MCV: 88.2 fL (ref 80.0–100.0)
MONOS PCT: 9 %
Monocytes Absolute: 0.9 10*3/uL (ref 0.2–0.9)
NEUTROS PCT: 69 %
Neutro Abs: 6.7 10*3/uL — ABNORMAL HIGH (ref 1.4–6.5)
Platelets: 302 10*3/uL (ref 150–440)
RBC: 2.99 MIL/uL — ABNORMAL LOW (ref 3.80–5.20)
RDW: 24.3 % — ABNORMAL HIGH (ref 11.5–14.5)
WBC: 9.8 10*3/uL (ref 3.6–11.0)

## 2018-06-12 NOTE — Progress Notes (Signed)
Patient: Cheryl [name] Female    DOB: 31-Jul-1951   67 y.o.   MRN: [id number] Visit Date: 06/15/2018  Today's Provider: Lelon Huh, MD   Chief Complaint  Patient presents with  . Hospitalization Follow-up   Subjective:    HPI   Follow up Hospitalization  Patient was admitted to [facility] on 06/01/2018 and discharged on 06/05/2018. She was treated for Hypoxia, COPD exacerbation, Respiratory Failure, Sepsis, CAD, Cellulitis of left lower extremity . Treatment for this included; patient was treated with nebulizer , steroids, and antibiotics. Per discharge summary patient will need to follow up with pulmonary as outpatient. While hospitalized, patient's chronic anemia was found to have worsened and was given packed RBC's. Patient to follow up with hematology as out patient and likely have bone marrow biopsy. Needs out patient chest x-ray to make sure of resolution.  Telephone follow up was done on n/a She reports good compliance with treatment. She reports this condition is Improved.  ---------------------------------------------------------------   Patient states she is doing better since her discharge from hospital. However over the last 2 days patient has been felling tired. Patient also has mouth sores. Patient has had and episode of vomiting today, an orange tingle bile. Patient also has redness in both legs and some swelling.   She had follow up with hematology for anemia on 7-19 and is to follow up in 6 weeks.   She also complains that she is unable to sleep, is taking trazodone at bedtime but doesn't feel it helps.   Allergies  Allergen Reactions  . Percocet [Oxycodone-Acetaminophen] Hives and Rash  . Aspirin Nausea And Vomiting and Other (See Comments)    Reaction:  GI upset   . Codeine Nausea And Vomiting, Other (See Comments) and Nausea Only    Reaction:  GI upset   . Propoxyphene Other (See Comments) and Nausea Only    GI upset Reaction:  GI upset   . Sulfa  Antibiotics Rash and Other (See Comments)    Reaction:  GI upset      Current Outpatient Medications:  .  acetaminophen (TYLENOL) 325 MG tablet, Take 2 tablets (650 mg total) by mouth every 6 (six) hours as needed for mild pain (or Fever >/= 101)., Disp: , Rfl:  .  albuterol (PROVENTIL) (2.5 MG/3ML) 0.083% nebulizer solution, Take 3 mLs (2.5 mg total) by nebulization every 4 (four) hours as needed for wheezing., Disp: 50 vial, Rfl: 12 .  alendronate (FOSAMAX) 70 MG tablet, Take 1 tablet (70 mg total) by mouth every 7 (seven) days. Take with a full glass of water on an empty stomach., Disp: 4 tablet, Rfl: 11 .  ALPRAZolam (XANAX) 0.5 MG tablet, Take 0.5 tablets (0.25 mg total) by mouth at bedtime as needed for anxiety. (Patient taking differently: Take 0.25-0.5 mg by mouth at bedtime as needed for anxiety. ), Disp: 2 tablet, Rfl: 0 .  celecoxib (CELEBREX) 100 MG capsule, Take 1 capsule by mouth as needed for pain., Disp: , Rfl:  .  clopidogrel (PLAVIX) 75 MG tablet, Take 75 mg by mouth daily., Disp: , Rfl:  .  cyclobenzaprine (FLEXERIL) 5 MG tablet, Take 1 tablet (5 mg total) by mouth 3 (three) times daily as needed for muscle spasms., Disp: 30 tablet, Rfl: 5 .  diphenoxylate-atropine (LOMOTIL) 2.5-0.025 MG tablet, Take 1 tablet by mouth as needed for diarrhea or loose stools., Disp: , Rfl:  .  donepezil (ARICEPT) 10 MG tablet, Take 10 mg by mouth  at bedtime., Disp: , Rfl:  .  DULoxetine (CYMBALTA) 30 MG capsule, Take 1 capsule by mouth daily., Disp: , Rfl:  .  estradiol (ESTRACE) 0.5 MG tablet, Take 1 tablet (0.5 mg total) by mouth daily. (Take in place of estropipate) (Patient taking differently: Take 0.5 mg by mouth every other day. (Take in place of estropipate)), Disp: 30 tablet, Rfl: 5 .  famotidine (PEPCID) 20 MG tablet, Take 1 tablet (20 mg total) by mouth daily., Disp: 30 tablet, Rfl: 0 .  [START ON 07/21/2018] fentaNYL (DURAGESIC - DOSED MCG/HR) 50 MCG/HR, Place 1 patch (50 mcg total) onto  the skin every 3 (three) days., Disp: 10 patch, Rfl: 0 .  ferrous sulfate 325 (65 FE) MG tablet, Take 1 tablet (325 mg total) by mouth 2 (two) times daily with a meal., Disp: 60 tablet, Rfl: 3 .  Fluticasone-Salmeterol (ADVAIR DISKUS) 250-50 MCG/DOSE AEPB, inhale 1 dose by mouth twice a day, Disp: 60 each, Rfl: 5 .  NARCAN 4 MG/0.1ML LIQD nasal spray kit, Place 0.4 mg into the nose once. , Disp: , Rfl:  .  ondansetron (ZOFRAN) 4 MG tablet, Take 0.5-1 tablets (2-4 mg total) by mouth every 6 (six) hours as needed for nausea. (Patient taking differently: Take 4 mg by mouth daily. ), Disp: 20 tablet, Rfl: 0 .  pantoprazole (PROTONIX) 40 MG tablet, Take 1 tablet (40 mg total) by mouth 2 (two) times daily., Disp: 60 tablet, Rfl: 12 .  predniSONE (STERAPRED UNI-PAK 21 TAB) 10 MG (21) TBPK tablet, Start at '60mg'$  taper by '10mg'$  until finished, Disp: 21 tablet, Rfl: 0 .  pregabalin (LYRICA) 150 MG capsule, Take 150 mg by mouth 2 (two) times daily., Disp: , Rfl:  .  simvastatin (ZOCOR) 10 MG tablet, Take 10 mg by mouth at bedtime. , Disp: , Rfl:  .  tiotropium (SPIRIVA HANDIHALER) 18 MCG inhalation capsule, inhale the contents of one capsule in the handihaler once daily, Disp: 30 capsule, Rfl: 12 .  traZODone (DESYREL) 50 MG tablet, Take 1-2 tablets (50-100 mg total) by mouth at bedtime., Disp: 30 tablet, Rfl: 3 .  vitamin B-12 (CYANOCOBALAMIN) 1000 MCG tablet, Take 1 tablet (1,000 mcg total) by mouth daily., Disp: 30 tablet, Rfl: 0 .  amLODipine (NORVASC) 5 MG tablet, Take 1 tablet (5 mg total) by mouth daily. (Patient not taking: Reported on 06/15/2018), Disp: 30 tablet, Rfl: 0 .  amoxicillin-clavulanate (AUGMENTIN) 875-125 MG tablet, Take 1 tablet by mouth 2 (two) times daily for 14 days. (Patient not taking: Reported on 06/15/2018), Disp: 28 tablet, Rfl: 0 .  guaiFENesin-dextromethorphan (ROBITUSSIN DM) 100-10 MG/5ML syrup, Take 15 mLs by mouth every 4 (four) hours as needed for cough. (Patient not taking:  Reported on 06/15/2018), Disp: 118 mL, Rfl: 0 .  triamcinolone ointment (KENALOG) 0.5 %, Apply to lesions on hands twice a day as needed (Patient not taking: Reported on 06/15/2018), Disp: 30 g, Rfl: 1  Review of Systems  Constitutional: Negative for appetite change, chills, fatigue and fever.  Respiratory: Negative for chest tightness and shortness of breath.   Cardiovascular: Negative for chest pain and palpitations.  Gastrointestinal: Negative for abdominal pain, nausea and vomiting.  Neurological: Negative for dizziness and weakness.    Social History   Tobacco Use  . Smoking status: Current Every Day Smoker    Packs/day: 1.00    Years: 50.00    Pack years: 50.00    Types: Cigarettes  . Smokeless tobacco: Never Used  . Tobacco comment: Previously smoked  2 ppd  Substance Use Topics  . Alcohol use: No    Alcohol/week: 0.0 oz   Objective:   BP (!) 124/54 (BP Location: Right Arm, Patient Position: Sitting, Cuff Size: Normal)   Pulse 96   Temp 98.9 F (37.2 C) (Oral)   Resp 16   Wt 103 lb (46.7 kg)   SpO2 90%   BMI 21.53 kg/m  Vitals:   06/15/18 1518  BP: (!) 124/54  Pulse: 96  Resp: 16  Temp: 98.9 F (37.2 C)  TempSrc: Oral  SpO2: 90%  Weight: 103 lb (46.7 kg)     Physical Exam   General Appearance:    Alert, cooperative, no distress, cachectic female in NAD.   Eyes:    PERRL, conjunctiva/corneas clear, EOM's intact       Lungs:     Clear to auscultation bilaterally, respirations unlabored  Heart:    Regular rate and rhythm  Neurologic:   Awake, alert, oriented x 3. No apparent focal neurological           defect.   Ext:  mild tenderness and erythema both anterior lower legs.        Assessment & Plan:     1. COPD with acute exacerbation ([place]) Improved since discharge. She is overdue for pulmonary follow up.  - Ambulatory referral to Pulmonology - DG Chest 2 View; Future  2. Insomnia, unspecified type Change to Rozerem '8mg'$  QHS.   3. Cellulitis of  lower extremity, unspecified laterality Start back on  - cephALEXin (KEFLEX) 500 MG capsule; Take 1 capsule (500 mg total) by mouth 4 (four) times daily for 10 days.  Dispense: 40 capsule; Refill: 0  4. Cheilitis  - triamcinolone (KENALOG) 0.025 % ointment; Apply 1 application topically 2 (two) times daily.  Dispense: 15 g; Refill: 0         Lelon Huh, MD  [facility]

## 2018-06-13 DIAGNOSIS — J449 Chronic obstructive pulmonary disease, unspecified: Secondary | ICD-10-CM | POA: Diagnosis not present

## 2018-06-15 ENCOUNTER — Ambulatory Visit
Admission: RE | Admit: 2018-06-15 | Discharge: 2018-06-15 | Disposition: A | Discharge status: Home / Self Care | Payer: Medicare HMO | Source: Ambulatory Visit | Attending: Family Medicine | Admitting: Family Medicine

## 2018-06-15 ENCOUNTER — Telehealth: Payer: Self-pay

## 2018-06-15 ENCOUNTER — Encounter: Payer: Self-pay | Admitting: Oncology

## 2018-06-15 ENCOUNTER — Encounter: Payer: Self-pay | Admitting: Family Medicine

## 2018-06-15 ENCOUNTER — Ambulatory Visit (INDEPENDENT_AMBULATORY_CARE_PROVIDER_SITE_OTHER): Payer: Medicare HMO | Admitting: Family Medicine

## 2018-06-15 VITALS — BP 124/54 | HR 96 | Temp 98.9°F | Resp 16 | Wt 103.0 lb

## 2018-06-15 DIAGNOSIS — F329 Major depressive disorder, single episode, unspecified: Secondary | ICD-10-CM | POA: Diagnosis present

## 2018-06-15 DIAGNOSIS — R296 Repeated falls: Secondary | ICD-10-CM | POA: Diagnosis present

## 2018-06-15 DIAGNOSIS — G609 Hereditary and idiopathic neuropathy, unspecified: Secondary | ICD-10-CM | POA: Diagnosis not present

## 2018-06-15 DIAGNOSIS — G629 Polyneuropathy, unspecified: Secondary | ICD-10-CM | POA: Diagnosis present

## 2018-06-15 DIAGNOSIS — M4180 Other forms of scoliosis, site unspecified: Secondary | ICD-10-CM | POA: Diagnosis not present

## 2018-06-15 DIAGNOSIS — K130 Diseases of lips: Secondary | ICD-10-CM

## 2018-06-15 DIAGNOSIS — K219 Gastro-esophageal reflux disease without esophagitis: Secondary | ICD-10-CM | POA: Diagnosis present

## 2018-06-15 DIAGNOSIS — E876 Hypokalemia: Secondary | ICD-10-CM | POA: Diagnosis present

## 2018-06-15 DIAGNOSIS — I340 Nonrheumatic mitral (valve) insufficiency: Secondary | ICD-10-CM | POA: Diagnosis not present

## 2018-06-15 DIAGNOSIS — F0631 Mood disorder due to known physiological condition with depressive features: Secondary | ICD-10-CM | POA: Diagnosis not present

## 2018-06-15 DIAGNOSIS — J441 Chronic obstructive pulmonary disease with (acute) exacerbation: Secondary | ICD-10-CM

## 2018-06-15 DIAGNOSIS — D509 Iron deficiency anemia, unspecified: Secondary | ICD-10-CM | POA: Diagnosis not present

## 2018-06-15 DIAGNOSIS — F419 Anxiety disorder, unspecified: Secondary | ICD-10-CM | POA: Diagnosis present

## 2018-06-15 DIAGNOSIS — I2510 Atherosclerotic heart disease of native coronary artery without angina pectoris: Secondary | ICD-10-CM | POA: Diagnosis present

## 2018-06-15 DIAGNOSIS — G894 Chronic pain syndrome: Secondary | ICD-10-CM | POA: Diagnosis present

## 2018-06-15 DIAGNOSIS — L03116 Cellulitis of left lower limb: Secondary | ICD-10-CM | POA: Diagnosis present

## 2018-06-15 DIAGNOSIS — Z7189 Other specified counseling: Secondary | ICD-10-CM | POA: Diagnosis not present

## 2018-06-15 DIAGNOSIS — R4182 Altered mental status, unspecified: Secondary | ICD-10-CM | POA: Diagnosis not present

## 2018-06-15 DIAGNOSIS — J189 Pneumonia, unspecified organism: Secondary | ICD-10-CM | POA: Diagnosis not present

## 2018-06-15 DIAGNOSIS — J3489 Other specified disorders of nose and nasal sinuses: Secondary | ICD-10-CM | POA: Diagnosis not present

## 2018-06-15 DIAGNOSIS — E785 Hyperlipidemia, unspecified: Secondary | ICD-10-CM | POA: Diagnosis present

## 2018-06-15 DIAGNOSIS — R748 Abnormal levels of other serum enzymes: Secondary | ICD-10-CM | POA: Diagnosis present

## 2018-06-15 DIAGNOSIS — J690 Pneumonitis due to inhalation of food and vomit: Secondary | ICD-10-CM | POA: Diagnosis not present

## 2018-06-15 DIAGNOSIS — F1290 Cannabis use, unspecified, uncomplicated: Secondary | ICD-10-CM | POA: Diagnosis present

## 2018-06-15 DIAGNOSIS — R0602 Shortness of breath: Secondary | ICD-10-CM | POA: Diagnosis not present

## 2018-06-15 DIAGNOSIS — M1712 Unilateral primary osteoarthritis, left knee: Secondary | ICD-10-CM | POA: Diagnosis not present

## 2018-06-15 DIAGNOSIS — J449 Chronic obstructive pulmonary disease, unspecified: Secondary | ICD-10-CM | POA: Diagnosis present

## 2018-06-15 DIAGNOSIS — R627 Adult failure to thrive: Secondary | ICD-10-CM | POA: Diagnosis present

## 2018-06-15 DIAGNOSIS — Z515 Encounter for palliative care: Secondary | ICD-10-CM | POA: Diagnosis not present

## 2018-06-15 DIAGNOSIS — G4700 Insomnia, unspecified: Secondary | ICD-10-CM | POA: Diagnosis not present

## 2018-06-15 DIAGNOSIS — J440 Chronic obstructive pulmonary disease with acute lower respiratory infection: Secondary | ICD-10-CM | POA: Diagnosis not present

## 2018-06-15 DIAGNOSIS — I10 Essential (primary) hypertension: Secondary | ICD-10-CM | POA: Diagnosis not present

## 2018-06-15 DIAGNOSIS — I739 Peripheral vascular disease, unspecified: Secondary | ICD-10-CM | POA: Diagnosis present

## 2018-06-15 DIAGNOSIS — I119 Hypertensive heart disease without heart failure: Secondary | ICD-10-CM | POA: Diagnosis present

## 2018-06-15 DIAGNOSIS — L03119 Cellulitis of unspecified part of limb: Secondary | ICD-10-CM

## 2018-06-15 DIAGNOSIS — R9431 Abnormal electrocardiogram [ECG] [EKG]: Secondary | ICD-10-CM | POA: Diagnosis not present

## 2018-06-15 DIAGNOSIS — J9621 Acute and chronic respiratory failure with hypoxia: Secondary | ICD-10-CM | POA: Diagnosis present

## 2018-06-15 DIAGNOSIS — Z8701 Personal history of pneumonia (recurrent): Secondary | ICD-10-CM | POA: Insufficient documentation

## 2018-06-15 DIAGNOSIS — F17210 Nicotine dependence, cigarettes, uncomplicated: Secondary | ICD-10-CM | POA: Diagnosis present

## 2018-06-15 DIAGNOSIS — R05 Cough: Secondary | ICD-10-CM | POA: Diagnosis not present

## 2018-06-15 DIAGNOSIS — I4581 Long QT syndrome: Secondary | ICD-10-CM | POA: Diagnosis not present

## 2018-06-15 DIAGNOSIS — I959 Hypotension, unspecified: Secondary | ICD-10-CM | POA: Diagnosis present

## 2018-06-15 DIAGNOSIS — G9349 Other encephalopathy: Secondary | ICD-10-CM | POA: Diagnosis present

## 2018-06-15 DIAGNOSIS — E43 Unspecified severe protein-calorie malnutrition: Secondary | ICD-10-CM | POA: Diagnosis present

## 2018-06-15 DIAGNOSIS — R7989 Other specified abnormal findings of blood chemistry: Secondary | ICD-10-CM | POA: Diagnosis present

## 2018-06-15 DIAGNOSIS — G9340 Encephalopathy, unspecified: Secondary | ICD-10-CM | POA: Diagnosis not present

## 2018-06-15 DIAGNOSIS — M810 Age-related osteoporosis without current pathological fracture: Secondary | ICD-10-CM | POA: Diagnosis present

## 2018-06-15 DIAGNOSIS — L03115 Cellulitis of right lower limb: Secondary | ICD-10-CM | POA: Diagnosis present

## 2018-06-15 MED ORDER — TRIAMCINOLONE ACETONIDE 0.025 % EX OINT: 1.0000 "application " | TOPICAL_OINTMENT | Freq: Two times a day (BID) | CUTANEOUS | 0 refills | Status: AC

## 2018-06-15 MED ORDER — CEPHALEXIN 500 MG PO CAPS: 500.0000 mg | ORAL_CAPSULE | Freq: Four times a day (QID) | ORAL | 0 refills | Status: DC

## 2018-06-15 MED ORDER — RAMELTEON 8 MG PO TABS: 8.0000 mg | ORAL_TABLET | Freq: Every day | ORAL | 1 refills | Status: DC

## 2018-06-15 NOTE — Progress Notes (Signed)
Hematology/Oncology Consult note [facility]  [phone] [phone]  Patient Care Team: Birdie Sons, MD as PCP - General (Family Medicine) Milinda Pointer, MD (Pain Medicine) Dionisio David, MD as Consulting Physician (Cardiology) Sherrlyn Hock, MD as Referring Physician (Gastroenterology) Vladimir Crofts, MD as Consulting Physician (Neurology) Alfonzo Feller, RN as [facility]   Name of the patient: Cheryl [name]  [id number]  03/30/1951   Date of visit: 06/15/18  Diagnosis- anemia of chronic disease with some component of iron deficiency  Chief complaint/ Reason for visit- post hospital discharge f/u of anemia  Heme/Onc history: patient is a 67 year old female with a past medical history that is significant for multiple comorbidities including coronary artery disease hypertension hyperlipidemia COPD peripheral vascular disease among other medical problems.  she was treated for pneumonia twice in the hospital in July 2019  Looking back at her CBC patient has had chronic anemia with a hemoglobin around 10 since 2017.  It then gradually drifted down to the low nines in 2018.  Since June 2019 her hemoglobin has been between 7-8.  She was found to have a hemoglobin of 6.3/19.3 when she was admitted on 06/02/2018.  BMP was within normal limits and there was no evidence of chronic kidney disease.  Iron studies were indicative of chronic disease.  B12 and folate were normal.  Haptoglobin was elevated and LDH was normal reticulocyte count was normal not suggestive of hemolysis.  She also recently had an upper endoscopy on 05/18/2018 for symptoms of difficulty swallowing and was found to have mild gastritis but a normal esophagus and duodenum.   Interval history- patient reports feeling better since her discharge. Her sob has improved. She is taking oral iron without difficulty and does not wish to take  IV iron  ECOG PS- 2 Pain scale- 3 Opioid associated constipation- no  Review of systems- Review of Systems  Constitutional: Positive for malaise/fatigue. Negative for chills, fever and weight loss.  HENT: Negative for congestion, ear discharge and nosebleeds.   Eyes: Negative for blurred vision.  Respiratory: Positive for shortness of breath. Negative for cough, hemoptysis, sputum production and wheezing.   Cardiovascular: Negative for chest pain, palpitations, orthopnea and claudication.  Gastrointestinal: Negative for abdominal pain, blood in stool, constipation, diarrhea, heartburn, melena, nausea and vomiting.  Genitourinary: Negative for dysuria, flank pain, frequency, hematuria and urgency.  Musculoskeletal: Negative for back pain, joint pain and myalgias.  Skin: Negative for rash.  Neurological: Negative for dizziness, tingling, focal weakness, seizures, weakness and headaches.  Endo/Heme/Allergies: Does not bruise/bleed easily.  Psychiatric/Behavioral: Negative for depression and suicidal ideas. The patient does not have insomnia.     Allergies  Allergen Reactions  . Percocet [Oxycodone-Acetaminophen] Hives and Rash  . Aspirin Nausea And Vomiting and Other (See Comments)    Reaction:  GI upset   . Codeine Nausea And Vomiting, Other (See Comments) and Nausea Only    Reaction:  GI upset   . Propoxyphene Other (See Comments) and Nausea Only    GI upset Reaction:  GI upset   . Sulfa Antibiotics Rash and Other (See Comments)    Reaction:  GI upset      Past Medical History:  Diagnosis Date  . Allergy   . Anxiety   . Arthritis   . Aspiration pneumonitis ([place]) 11/24/2015  . Asthma   . Chronic pain   . COPD (chronic obstructive pulmonary disease) ([place])   .  Coronary artery disease   . DVT (deep venous thrombosis) ([place])   . GERD (gastroesophageal reflux disease)   . Headache   . Hyperlipidemia   . Hypertension   . Kyphoscoliosis deformity of spine   . Migraines   .  Neuropathy 2010  . Osteoporosis   . Oxygen deficiency   . Peripheral vascular disease ([place])   . Pneumonia   . Pneumonia 11/19/2015  . Pneumonia 10/2016  . Pneumonia    aspiration 2018- 4 times in last year  . Vitamin D deficiency      Past Surgical History:  Procedure Laterality Date  . ABDOMINAL HYSTERECTOMY  1975   Bilaterl Oophorectomy; [place] to IUD infection  . abdomnal aortic stent  05/30/2008   Dr. Quay Burow  . APPENDECTOMY    . APPENDECTOMY    . cardiac catherization  10/31/2009  . CERVICAL FUSION  C5 - 6/C6-7  . CHOLECYSTECTOMY  1972  . COLONOSCOPY WITH PROPOFOL N/A 07/27/2015   Procedure: COLONOSCOPY WITH PROPOFOL;  Surgeon: Hulen Luster, MD;  Location: [facility] ENDOSCOPY;  Service: Gastroenterology;  Laterality: N/A;  . ESOPHAGOGASTRODUODENOSCOPY (EGD) WITH PROPOFOL N/A 07/27/2015   Procedure: ESOPHAGOGASTRODUODENOSCOPY (EGD) WITH PROPOFOL;  Surgeon: Hulen Luster, MD;  Location: [facility] ENDOSCOPY;  Service: Gastroenterology;  Laterality: N/A;  . ESOPHAGOGASTRODUODENOSCOPY (EGD) WITH PROPOFOL N/A 05/19/2018   Procedure: ESOPHAGOGASTRODUODENOSCOPY (EGD) WITH PROPOFOL;  Surgeon: Lucilla Lame, MD;  Location: [facility] ENDOSCOPY;  Service: Endoscopy;  Laterality: N/A;  . FOOT SURGERY Bilateral    5-6 years per patient  . SPINE SURGERY      Social History   Socioeconomic History  . Marital status: Married    Spouse name: Not on file  . Number of children: 6  . Years of education: Not on file  . Highest education level: Not on file  Occupational History  . Occupation: Disabled  Social Needs  . Financial resource strain: Not on file  . Food insecurity:    Worry: Not on file    Inability: Not on file  . Transportation needs:    Medical: Not on file    Non-medical: Not on file  Tobacco Use  . Smoking status: Current Every Day Smoker    Packs/day: 1.00    Years: 50.00    Pack years: 50.00    Types: Cigarettes  . Smokeless tobacco: Never Used  . Tobacco comment: Previously  smoked 2 ppd  Substance and Sexual Activity  . Alcohol use: No    Alcohol/week: 0.0 oz  . Drug use: No  . Sexual activity: Never  Lifestyle  . Physical activity:    Days per week: Not on file    Minutes per session: Not on file  . Stress: Not on file  Relationships  . Social connections:    Talks on phone: Not on file    Gets together: Not on file    Attends religious service: Not on file    Active member of club or organization: Not on file    Attends meetings of clubs or organizations: Not on file    Relationship status: Not on file  . Intimate partner violence:    Fear of current or ex partner: Not on file    Emotionally abused: Not on file    Physically abused: Not on file    Forced sexual activity: Not on file  Other Topics Concern  . Not on file  Social History Narrative  . Not on file    Family History  Problem  Relation Age of Onset  . Cancer Mother   . Arthritis Mother   . Heart disease Mother   . Diabetes Mother        mellitus, type 2  . Heart disease Father   . Diabetes Sister   . Cancer Brother   . Cancer Brother        lung  . Diabetes Brother      Current Outpatient Medications:  .  acetaminophen (TYLENOL) 325 MG tablet, Take 2 tablets (650 mg total) by mouth every 6 (six) hours as needed for mild pain (or Fever >/= 101)., Disp: , Rfl:  .  albuterol (PROVENTIL) (2.5 MG/3ML) 0.083% nebulizer solution, Take 3 mLs (2.5 mg total) by nebulization every 4 (four) hours as needed for wheezing., Disp: 50 vial, Rfl: 12 .  alendronate (FOSAMAX) 70 MG tablet, Take 1 tablet (70 mg total) by mouth every 7 (seven) days. Take with a full glass of water on an empty stomach., Disp: 4 tablet, Rfl: 11 .  ALPRAZolam (XANAX) 0.5 MG tablet, Take 0.5 tablets (0.25 mg total) by mouth at bedtime as needed for anxiety. (Patient taking differently: Take 0.25-0.5 mg by mouth at bedtime as needed for anxiety. ), Disp: 2 tablet, Rfl: 0 .  amLODipine (NORVASC) 5 MG tablet, Take 1  tablet (5 mg total) by mouth daily., Disp: 30 tablet, Rfl: 0 .  amoxicillin-clavulanate (AUGMENTIN) 875-125 MG tablet, Take 1 tablet by mouth 2 (two) times daily for 14 days., Disp: 28 tablet, Rfl: 0 .  clopidogrel (PLAVIX) 75 MG tablet, Take 75 mg by mouth daily., Disp: , Rfl:  .  cyclobenzaprine (FLEXERIL) 5 MG tablet, Take 1 tablet (5 mg total) by mouth 3 (three) times daily as needed for muscle spasms., Disp: 30 tablet, Rfl: 5 .  diphenoxylate-atropine (LOMOTIL) 2.5-0.025 MG tablet, Take 1 tablet by mouth as needed for diarrhea or loose stools., Disp: , Rfl:  .  donepezil (ARICEPT) 10 MG tablet, Take 10 mg by mouth at bedtime., Disp: , Rfl:  .  DULoxetine (CYMBALTA) 30 MG capsule, Take 1 capsule by mouth daily., Disp: , Rfl:  .  estradiol (ESTRACE) 0.5 MG tablet, Take 1 tablet (0.5 mg total) by mouth daily. (Take in place of estropipate) (Patient taking differently: Take 0.5 mg by mouth every other day. (Take in place of estropipate)), Disp: 30 tablet, Rfl: 5 .  famotidine (PEPCID) 20 MG tablet, Take 1 tablet (20 mg total) by mouth daily., Disp: 30 tablet, Rfl: 0 .  [START ON 07/21/2018] fentaNYL (DURAGESIC - DOSED MCG/HR) 50 MCG/HR, Place 1 patch (50 mcg total) onto the skin every 3 (three) days., Disp: 10 patch, Rfl: 0 .  ferrous sulfate 325 (65 FE) MG tablet, Take 1 tablet (325 mg total) by mouth 2 (two) times daily with a meal., Disp: 60 tablet, Rfl: 3 .  Fluticasone-Salmeterol (ADVAIR DISKUS) 250-50 MCG/DOSE AEPB, inhale 1 dose by mouth twice a day, Disp: 60 each, Rfl: 5 .  ondansetron (ZOFRAN) 4 MG tablet, Take 0.5-1 tablets (2-4 mg total) by mouth every 6 (six) hours as needed for nausea. (Patient taking differently: Take 4 mg by mouth daily. ), Disp: 20 tablet, Rfl: 0 .  pantoprazole (PROTONIX) 40 MG tablet, Take 1 tablet (40 mg total) by mouth 2 (two) times daily., Disp: 60 tablet, Rfl: 12 .  predniSONE (STERAPRED UNI-PAK 21 TAB) 10 MG (21) TBPK tablet, Start at '60mg'$  taper by '10mg'$  until  finished, Disp: 21 tablet, Rfl: 0 .  pregabalin (LYRICA) 150 MG  capsule, Take 150 mg by mouth 2 (two) times daily., Disp: , Rfl:  .  simvastatin (ZOCOR) 10 MG tablet, Take 10 mg by mouth at bedtime. , Disp: , Rfl:  .  tiotropium (SPIRIVA HANDIHALER) 18 MCG inhalation capsule, inhale the contents of one capsule in the handihaler once daily, Disp: 30 capsule, Rfl: 12 .  traZODone (DESYREL) 50 MG tablet, Take 1-2 tablets (50-100 mg total) by mouth at bedtime., Disp: 30 tablet, Rfl: 3 .  triamcinolone ointment (KENALOG) 0.5 %, Apply to lesions on hands twice a day as needed, Disp: 30 g, Rfl: 1 .  vitamin B-12 (CYANOCOBALAMIN) 1000 MCG tablet, Take 1 tablet (1,000 mcg total) by mouth daily., Disp: 30 tablet, Rfl: 0 .  celecoxib (CELEBREX) 100 MG capsule, Take 1 capsule by mouth as needed for pain., Disp: , Rfl:  .  guaiFENesin-dextromethorphan (ROBITUSSIN DM) 100-10 MG/5ML syrup, Take 15 mLs by mouth every 4 (four) hours as needed for cough. (Patient not taking: Reported on 06/12/2018), Disp: 118 mL, Rfl: 0 .  NARCAN 4 MG/0.1ML LIQD nasal spray kit, Place 0.4 mg into the nose once. , Disp: , Rfl:   Physical exam:  Vitals:   06/12/18 1345  BP: (!) 167/70  Pulse: 85  Resp: (!) 22  Temp: (!) 97 F (36.1 C)  TempSrc: Tympanic  SpO2: 95%  Weight: 111 lb (50.3 kg)  Height: '4\' 10"'$  (1.473 m)   Physical Exam  Constitutional: She is oriented to person, place, and time.  Thin frail petite lady who appears older than stated age. No acute distress. Ambulates with a walker  HENT:  Head: Normocephalic and atraumatic.  Eyes: Pupils are equal, round, and reactive to light. EOM are normal.  Neck: Normal range of motion.  Cardiovascular: Normal rate, regular rhythm and normal heart sounds.  Pulmonary/Chest: Effort normal and breath sounds normal.  Abdominal: Soft. Bowel sounds are normal.  Neurological: She is alert and oriented to person, place, and time.  Skin: Skin is warm and dry.     CMP Latest Ref  Rng & Units 06/04/2018  Glucose 70 - 99 mg/dL 109(H)  BUN 8 - 23 mg/dL 17  Creatinine 0.44 - 1.00 mg/dL 0.68  Sodium 135 - 145 mmol/L 141  Potassium 3.5 - 5.1 mmol/L 3.1(L)  Chloride 98 - 111 mmol/L 109  CO2 22 - 32 mmol/L 24  Calcium 8.9 - 10.3 mg/dL 8.4(L)  Total Protein 6.5 - 8.1 g/dL -  Total Bilirubin 0.3 - 1.2 mg/dL -  Alkaline Phos 38 - 126 U/L -  AST 15 - 41 U/L -  ALT 0 - 44 U/L -   CBC Latest Ref Rng & Units 06/12/2018  WBC 3.6 - 11.0 K/uL 9.8  Hemoglobin 12.0 - 16.0 g/dL 8.5(L)  Hematocrit 35.0 - 47.0 % 26.3(L)  Platelets 150 - 440 K/uL 302    No images are attached to the encounter.  Dg Chest 2 View  Result Date: 06/01/2018 CLINICAL DATA:  Fever and productive cough. History of asthma/COPD, CAD and hypertension. EXAM: CHEST - 2 VIEW COMPARISON:  05/25/2018 and 05/16/2018 radiographs. FINDINGS: Persistent patient rotation to the right. The heart size and mediastinal contours are stable. There is overall improved aeration of the right middle lobe and lingula. Chronic interstitial prominence is otherwise unchanged. There is no edema, confluent airspace opacity, pleural effusion or pneumothorax. Abdominal aortic stent graft is noted. No acute osseous findings are seen. IMPRESSION: Overall improved aeration of both lung bases with otherwise stable generalized interstitial prominence.  No acute cardiopulmonary process. Electronically Signed   By: Richardean Sale M.D.   On: 06/01/2018 12:19   Dg Chest 2 View  Result Date: 05/26/2018 CLINICAL DATA:  Cough EXAM: CHEST - 2 VIEW COMPARISON:  05/16/2018 FINDINGS: Cardiac shadow is mildly enlarged. The lungs are well aerated bilaterally with mild interstitial changes stable from the prior exam. No focal infiltrate is seen. Some scarring in the lingula and right middle lobe is noted. No acute bony abnormality is noted. IMPRESSION: Scarring without acute abnormality. Electronically Signed   By: Inez Catalina M.D.   On: 05/26/2018 08:55   Dg  Chest 2 View  Result Date: 05/16/2018 CLINICAL DATA:  Cough; Pt. admitted 05-14-18 due to AMS, lethargy, confusion. Suspected to have underlying aspiration pneumonia but chest x-ray has been negative. She did have a low-grade fever admission.Hx/o COPD, CAD, DVTSmoker EXAM: CHEST - 2 VIEW COMPARISON:  05/14/2018 and older exams. FINDINGS: Cardiac silhouette is normal in size. No mediastinal or hilar masses. No convincing adenopathy. Small to moderate hiatal hernia. There is opacity at the posterior left lung base, mildly increased when compared to the study dated 02/08/2018. This may reflect pneumonia or atelectasis, the latter favored. There are prominent bronchovascular markings. No other evidence pneumonia. No evidence of pulmonary edema. No pleural effusion or pneumothorax. Skeletal structures are demineralized but grossly intact. IMPRESSION: 1. Left posterior lower lobe opacity mildly increased when compared to a chest radiograph dated 02/08/2018. This may reflect pneumonia or be due to atelectasis. 2. No other evidence of acute cardiopulmonary disease. Electronically Signed   By: Lajean Manes M.D.   On: 05/16/2018 13:30   Dg Wrist Complete Left  Result Date: 05/20/2018 CLINICAL DATA:  Left wrist pain EXAM: LEFT WRIST - COMPLETE 3+ VIEW COMPARISON:  04/09/2016 FINDINGS: Osteoarthritis at the base of the thumb metacarpal and triscaphe joints are redemonstrated with progressive degenerative cystic change noted at the base of the thumb metacarpal. Osteoarthritic joint space narrowing is also identified with spurring about the first MCP joint. Carpal rows are maintained. No acute fracture or suspicious osseous lesions. Mild generalized soft tissue swelling is suggested. IMPRESSION: Osteoarthritis of the first MCP, CMC and triscaphe joints, slightly progressed since prior. No acute osseous abnormality. Electronically Signed   By: Ashley Royalty M.D.   On: 05/20/2018 18:38   Ct Chest W Contrast  Result Date:  06/01/2018 CLINICAL DATA:  67 year old presenting with acute onset of fever up to 103 degrees Fahrenheit associated with a productive cough. Patient recently discharged from the hospital with a diagnosis of pneumonia. Hypoxemia upon arrival to the emergency department with oxygen saturation 88% on room air. Crackles in the lung bases on clinical examination. EXAM: CT CHEST WITH CONTRAST TECHNIQUE: Multidetector CT imaging of the chest was performed during intravenous contrast administration. CONTRAST:  56m OMNIPAQUE IOHEXOL 300 MG/ML IV. COMPARISON:  CT chest 06/03/2016 and earlier. Chest x-rays earlier same day, 05/25/2018 and earlier. FINDINGS: Cardiovascular: Heart mildly enlarged, unchanged. Moderate three-vessel coronary atherosclerosis. No pericardial effusion. Moderate to severe atherosclerosis involving the thoracic and UPPER abdominal aorta without evidence of aneurysm. Atherosclerosis at the origin of the LEFT subclavian artery without evidence of hemodynamically significant stenosis. Atherosclerosis at the origin of the celiac and superior mesenteric arteries without evidence of hemodynamically significant stenosis. Mediastinum/Nodes: Scattered upper normal sized mediastinal lymph nodes, the largest in the subcarinal region (station 7) measuring approximately 1.6 x 1.7 cm, unchanged from prior examinations. No new or enlarging lymphadenopathy. Large hiatal hernia. Normal appearing esophagus. Normal-appearing thyroid  gland. Lungs/Pleura: Marked hyperinflation with emphysematous changes throughout both lungs. Peripheral blebs in the deep LATERAL RIGHT lower lobe as noted previously. Airspace opacities throughout both lungs with a nodular acinar pattern, involving the RIGHT upper lobe, RIGHT lower lobe and LEFT lower lobe. The greatest involvement is in the RIGHT lower lobe. Linear scarring in the lingula, RIGHT middle lobe and both lower lobes, unchanged. No pleural effusions. Central airways patent with  marked central bronchial wall thickening. Upper Abdomen: Possible hepatomegaly, though the liver is incompletely imaged. Anatomic variant in that the LEFT lobe of the liver extends well across the midline into the LEFT UPPER quadrant. Apart from the large hiatal hernia, no significant findings elsewhere in the visualized upper abdomen. Musculoskeletal: Severe degenerative disc disease and spondylosis at C6-7, incompletely imaged. Osseous demineralization. Mild to moderate degenerative disc disease and spondylosis involving the mid and LOWER thoracic spine. Remote fractures involving the RIGHT POSTERIOR eighth rib and the LEFT LATERAL ninth rib. No acute findings. IMPRESSION: 1. Patchy pneumonia involving the BILATERAL lower lobes and the RIGHT upper lobe, with the greatest involvement in the RIGHT lower lobe. Marked central peribronchial thickening indicating asthma and/or bronchitis. 2. Stable mild reactive mediastinal lymphadenopathy. No new or enlarging lymphadenopathy. 3. Large hiatal hernia. 4. Possible hepatomegaly, though the liver is incompletely imaged. 5. Stable mild cardiomegaly. Moderate three-vessel coronary atherosclerosis. Aortic Atherosclerosis (ICD10-I70.0) and Emphysema (ICD10-J43.9). Electronically Signed   By: Evangeline Dakin M.D.   On: 06/01/2018 14:11   Mr Brain Wo Contrast  Result Date: 05/18/2018 CLINICAL DATA:  Lethargy and confusion, which began recently. EXAM: MRI HEAD WITHOUT CONTRAST TECHNIQUE: Multiplanar, multiecho pulse sequences of the brain and surrounding structures were obtained without intravenous contrast. COMPARISON:  CT head 09/12/2017.  MR head 11/26/2012. FINDINGS: Brain: No evidence for acute infarction, hemorrhage, mass lesion, hydrocephalus, or extra-axial fluid. Normal cerebral volume. Moderately advanced T2 and FLAIR hyperintensities throughout the white matter, likely small vessel disease. Vascular: Flow voids are maintained throughout the carotid, basilar, and  vertebral arteries. There are no areas of chronic hemorrhage. Skull and upper cervical spine: Unremarkable visualized calvarium, skullbase, and cervical vertebrae. Pituitary, pineal, cerebellar tonsils unremarkable. No upper cervical cord lesions. Sinuses/Orbits: No orbital masses or proptosis. Globes appear symmetric. Sinuses appear well aerated, without evidence for air-fluid level. Other: None. IMPRESSION: Normal for age cerebral volume. Moderately advanced small vessel disease, similar to prior MR. No acute intracranial findings. Electronically Signed   By: Staci Righter M.D.   On: 05/18/2018 14:52     Assessment and plan- Patient is a 66 y.o. female with anemia of chronic disease and some component of iron deficiency  Patients hb has been between 9-10 over last 2 years. It has now drifted down to 8's. I suspect this could be from recent hospitalization/ infection. Anemia work up was unremarkable other than ferritin level of 33. I recommended trial of IV iron. She would like to continue oral iron at this time.   Repeat cbc in 6 weeks, cbc ferritin and iron studies and see md in 12 weeks   Visit Diagnosis 1. Anemia of chronic disease   2. Iron deficiency anemia, unspecified iron deficiency anemia type      Dr. Randa Evens, MD, MPH [facility] [phone] 06/15/2018 9:06 AM

## 2018-06-15 NOTE — Telephone Encounter (Signed)
Called pt to schedule AWV and pt stated that she did not have her calender with her and that she would not be able to schedule at this time. [facility]. Note made.. -MM

## 2018-06-15 NOTE — Patient Instructions (Signed)
Go to the [facility] on [address] for chest Xray  

## 2018-06-16 ENCOUNTER — Telehealth: Payer: Self-pay | Admitting: Family Medicine

## 2018-06-16 ENCOUNTER — Encounter: Payer: Self-pay | Admitting: *Deleted

## 2018-06-16 ENCOUNTER — Other Ambulatory Visit: Payer: Self-pay | Admitting: *Deleted

## 2018-06-16 NOTE — Patient Outreach (Signed)
Cheryl [name]) Care Management   06/16/2018  Cheryl [name], Cheryl [name]  Cheryl [name] is an 67 y.o. female  Subjective:  Patient states she is okay and I don't need to go to the hospital.  Patient sitting outside with her sister, sister discussed patient husband is concerned regarding patient this morning she had temperature this morning of 100 and heart rate increased.   Patient sleepy on arrival, just finished smoking , sister explained husband was concerned because patient just wasn't right this last night and this morning. And he plans to take her to the hospital on today .  Oxygen saturation room air at 80% , patient agreeable to returning inside and placing oxygen on 2 liters SATs up to 97%.  Patient insistent that she is fine and does not need to go the hospital.  After about 30 minutes in the house , patient insist on returning to outside to smoke her sister assisted her.  Patient sister states at this time patient is the most alert she has been. Husband report he started to call 911 last night due to patient just "being out of it" temperature over 101. Oxygen down to 70, increased after placing oxygen states he stayed awake last night watching her .  Husband reports concern regarding brown drainage from nose and mouth on last night , none noted today. .     Objective:  BP (!) 104/54 (BP Location: Left Arm, Patient Position: Sitting, Cuff Size: Normal)   Pulse 74   Resp 18   Ht 1.473 m ('4\' 10"'$ )   Wt 104 lb (47.2 kg)   SpO2 90%   BMI 21.74 kg/m  Review of Systems  Constitutional: Positive for fever.  HENT: Negative.   Eyes: Negative.   Respiratory: Negative for shortness of breath and wheezing.   Cardiovascular: Positive for leg swelling.  Gastrointestinal: Positive for vomiting.       Vomiting yesterday, orange colored, No vomiting todayy  Genitourinary: Negative.   Musculoskeletal: Positive for back pain and myalgias.  Skin: Negative.     Neurological: Positive for weakness. Negative for dizziness.  Endo/Heme/Allergies: Bruises/bleeds easily.  Psychiatric/Behavioral: Negative.     Physical Exam  Constitutional: She is oriented to person, place, and time. She appears well-developed. She has a sickly appearance.  Cardiovascular: Normal rate and normal heart sounds.  Respiratory: Effort normal. She has no wheezes. She has rales.  GI: Soft.  Neurological: She is alert and oriented to person, place, and time.  Skin: Skin is warm and dry. There is erythema. There is pallor.     Psychiatric: She has a normal mood and affect. Her behavior is normal. Judgment and thought content normal.    Encounter Medications:   Outpatient Encounter Medications as of 06/16/2018  Medication Sig Note  . acetaminophen (TYLENOL) 325 MG tablet Take 2 tablets (650 mg total) by mouth every 6 (six) hours as needed for mild pain (or Fever >/= 101).   Marland Kitchen albuterol (PROVENTIL) (2.5 MG/3ML) 0.083% nebulizer solution Take 3 mLs (2.5 mg total) by nebulization every 4 (four) hours as needed for wheezing. 06/01/2018: Usually twice daily  . alendronate (FOSAMAX) 70 MG tablet Take 1 tablet (70 mg total) by mouth every 7 (seven) days. Take with a full glass of water on an empty stomach. 06/01/2018: Every Monday  . ALPRAZolam (XANAX) 0.5 MG tablet Take 0.5 tablets (0.25 mg total) by mouth at bedtime as needed for anxiety. (Patient taking differently: Take 0.25-0.5 mg by mouth  at bedtime as needed for anxiety. )   . celecoxib (CELEBREX) 100 MG capsule Take 1 capsule by mouth as needed for pain.   Marland Kitchen clopidogrel (PLAVIX) 75 MG tablet Take 75 mg by mouth daily.   . cyclobenzaprine (FLEXERIL) 5 MG tablet Take 1 tablet (5 mg total) by mouth 3 (three) times daily as needed for muscle spasms.   . diphenoxylate-atropine (LOMOTIL) 2.5-0.025 MG tablet Take 1 tablet by mouth as needed for diarrhea or loose stools.   . donepezil (ARICEPT) 10 MG tablet Take 10 mg by mouth at bedtime.    . DULoxetine (CYMBALTA) 30 MG capsule Take 1 capsule by mouth daily.   Marland Kitchen estradiol (ESTRACE) 0.5 MG tablet Take 1 tablet (0.5 mg total) by mouth daily. (Take in place of estropipate) (Patient taking differently: Take 0.5 mg by mouth every other day. (Take in place of estropipate))   . famotidine (PEPCID) [name] MG tablet Take 1 tablet ([name] mg total) by mouth daily.   Derrill Memo ON 07/21/2018] fentaNYL (DURAGESIC - DOSED MCG/HR) 50 MCG/HR Place 1 patch (50 mcg total) onto the skin every 3 (three) days.   . ferrous sulfate 325 (65 FE) MG tablet Take 1 tablet (325 mg total) by mouth 2 (two) times daily with a meal.   . Fluticasone-Salmeterol (ADVAIR DISKUS) 250-50 MCG/DOSE AEPB inhale 1 dose by mouth twice a day   . NARCAN 4 MG/0.1ML LIQD nasal spray kit Place 0.4 mg into the nose once.  06/16/2018: Has no hand If needed  . ondansetron (ZOFRAN) 4 MG tablet Take 0.5-1 tablets (2-4 mg total) by mouth every 6 (six) hours as needed for nausea. (Patient taking differently: Take 4 mg by mouth daily. )   . pantoprazole (PROTONIX) 40 MG tablet Take 1 tablet (40 mg total) by mouth 2 (two) times daily. 06/01/2018: Usually only takes morning dose.  . pregabalin (LYRICA) 150 MG capsule Take 150 mg by mouth 2 (two) times daily.   . simvastatin (ZOCOR) 10 MG tablet Take 10 mg by mouth at bedtime.    Marland Kitchen tiotropium (SPIRIVA HANDIHALER) 18 MCG inhalation capsule inhale the contents of one capsule in the handihaler once daily   . traZODone (DESYREL) 50 MG tablet Take 1-2 tablets (50-100 mg total) by mouth at bedtime.   . vitamin B-12 (CYANOCOBALAMIN) 1000 MCG tablet Take 1 tablet (1,000 mcg total) by mouth daily.   Marland Kitchen amLODipine (NORVASC) 5 MG tablet Take 1 tablet (5 mg total) by mouth daily. (Patient not taking: Reported on 06/15/2018)   . cephALEXin (KEFLEX) 500 MG capsule Take 1 capsule (500 mg total) by mouth 4 (four) times daily for 10 days. 06/16/2018: Spouse to pick up prescription   . predniSONE (STERAPRED UNI-PAK 21 TAB) 10  MG (21) TBPK tablet Start at '60mg'$  taper by '10mg'$  until finished (Patient not taking: Reported on 06/16/2018) 06/16/2018: Completed per spouse  . ramelteon (ROZEREM) 8 MG tablet Take 1 tablet (8 mg total) by mouth at bedtime. 06/16/2018: Has not started yet   . triamcinolone (KENALOG) 0.025 % ointment Apply 1 application topically 2 (two) times daily. 06/16/2018: Husband to pick up prescription    No facility-administered encounter medications on file as of 06/16/2018.     Functional Status:   In your present state of health, do you have any difficulty performing the following activities: 06/16/2018 06/01/2018  Hearing? N N  Vision? N N  Difficulty concentrating or making decisions? N N  Walking or climbing stairs? [name]  Comment uses walker  and husband support and ramps  -  Dressing or bathing? N N  Comment husband assist as needed -  Doing errands, shopping? Y N  Comment husband and family assist as needed  -  Conservation officer, nature and eating ? Y -  Comment husband prepares  -  Using the Toilet? N -  In the past six months, have you accidently leaked urine? Y -  Do you have problems with loss of bowel control? N -  Managing your Medications? Y -  Comment husband assist  -  Managing your Finances? Y -  Comment husband handles  -  Housekeeping or managing your Housekeeping? Y -  Comment husband and family does  -  Some recent data might be hidden    Fall/Depression Screening:    Fall Risk  06/08/2018 05/22/2018 04/27/2018  Falls in the past year? Yes Yes Yes  Comment - - -  Number falls in past yr: 2 or more - 1  Injury with Fall? No - No  Comment - - -  Risk Factor Category  High Fall Risk - -  Risk for fall due to : History of fall(s) - -  Risk for fall due to: Comment - - -  Follow up Falls prevention discussed - -   PHQ 2/9 Scores 06/16/2018 04/27/2018 01/22/2018 11/11/2017 09/23/2017 08/12/2017 07/21/2017  PHQ - 2 Score 0 0 0 0 0 0 0  Exception Documentation - - - Patient refusal Patient  refusal - -    Assessment:  Initial home visit. Patient sister and husband.  [facility] is active with [facility] for home health RN and physical therapy.   COPD /Recent Pneumonia- low grade temperature of 99, denies cough, or discolored sputum. Low oxygen levels when on room air, saturations up to 97% when on 2 liters. Patient sleepy initially at visit then more alert after oxygen applied. Patient awake having conversation with family by end of visit .  Patient continues to smoke outside. Referral to pulmonary MD in place awaiting scheduling appointment .   Bilateral leg redness and swelling -  Keflex prescribed at PCP visit on yesterday , husband to pick up today. Educated on completing full prescription. Educated on wearing longing clothing to limit cat scratches to fragile skin.   Anemia Reports stool are dark colored, recent hematology visit.    Nausea/Vomiting Report episode on yesterday of orange tinged emesis, husband reports brown colored secretion from mouth and nose on last night, none noted today. Denies vomiting episode. Patient tolerating her usual snacks of peanut butter crackers on last night.  Will continue education of signs of bleeding.    Notified Dr.Fisher office of concerns noted at visit of patient low oxygen saturation of 80% on arrival and patient being sleepy. Reported oxygen level up to 97% when oxygen applied at 2 liters, patient more alert , respiration unlabored, temperature of 99, BP of 104/54.  Reported husband concern of patient just not acting right on last night , " being out of it", temp over 101,  Patient insistent on returning outside to smoke at end of visit, she was alert, on room air , 89-90%. Patient insistent that she did not need to and was not going to MD or ED at this time, Husband reports she will go if her condition changed.    Plan:  RN placed call to PCP office to discuss concerns regarding patient condition . Reviewed with spouse  worsening/unresolved  symptoms to notify MD of elevated  temperature, increased shortness of breath low oxygen, lethargy, advised regarding yellow zone symptoms and 911 for red zone symptoms reviewed.  Provided patient with EMMI on COPD when to call, Pneumonia in Adults  Will send PCP visit note.  Will schedule follow up call to patient in the next week.  Provided spouse with contact number for questions and 24 nurse line number .    [facility] CM Care Plan Problem One     Most Recent Value  Care Plan Problem One  At risk for readmission related to 2 admits related to COPD, pneumonia, anemia   Role Documenting the Problem One  Care Management Coordinator  Care Plan for Problem One  Active  [facility] Long Term Goal   Patient will not have any readmissions within the next 90 days.   [facility] Long Term Goal Start Date  06/08/18  Interventions for Problem One Long Term Goal  Clinical status reviewed, care coordination call to PCP office. , advised patient regarding importance of wearing oxygen  to help lungs , heart. and brain, discussed rationale.   [facility] CM Short Term Goal #1   Patient will be able to report worsening symptoms of COPD, Pneumonia in the next 30 days   [facility] CM Short Term Goal #1 Start Date  06/08/18  [name] CM Short Term Goal #1 Met Date  06/08/18  [facility] CM Short Term Goal #2   Patient will attend all medical appointment in the next 30 days   [facility] CM Short Term Goal #2 Start Date  06/08/18  Interventions for Short Term Goal #2  Discussed recent PCP , and hematology visit, and plans for pulmonary referral appointment .   [facility] CM Short Term Goal #3  Patient will be able to identify worsening symptons of infection in the next 30 days   [facility] CM Short Term Goal #3 Start Date  06/16/18  Interventions for Short Tern Goal #3  Advised patient regarding worsening sign of infection , increased redness of legs areas, temperature elevation , increased weakness, cough , discolored sputum .       Joylene Draft, RN,  [place] Management Coordinator  [phone]- Mobile [phone]- Toll Free Main Office

## 2018-06-16 NOTE — Telephone Encounter (Signed)
Called pt to schedule AWV and pt stated that she would rather wait to after her next f/u on 07/01/18 before she schedules the wellness. Note made. -MM

## 2018-06-16 NOTE — Telephone Encounter (Signed)
[name] stated when she arrived at pt's home for home visit this morning pt was sitting outside smoking a cigarette. Pt seemed sleepy, pale, & weak. Pt's temp 99, BP 104/54, & oxygen level 80%. Cheryl [name] stated she put pt on 2 liters of oxygen and pt's O2 level came up to 97%. Cheryl stated pt's husband advised last night pt's temp was 101, O2 70%, & heart rate 140 also, that pt had a brownish colored mucus drainage from both nose and mouth. Cheryl stated before she left pt seemed to be feeling better and more alert. Pt's O2 was 89-90 on room air and pt insisted on going outside to smoke when Cheryl left. Cheryl wanted Cheryl [name] to be aware. Please advise. Thanks TNP

## 2018-06-17 ENCOUNTER — Other Ambulatory Visit: Payer: Self-pay | Admitting: Family Medicine

## 2018-06-17 MED ORDER — SUVOREXANT 5 MG PO TABS: 1.0000 | ORAL_TABLET | Freq: Every evening | ORAL | 2 refills | Status: DC | PRN

## 2018-06-18 ENCOUNTER — Emergency Department: Payer: Medicare HMO

## 2018-06-18 ENCOUNTER — Encounter: Payer: Self-pay | Admitting: Emergency Medicine

## 2018-06-18 ENCOUNTER — Other Ambulatory Visit (HOSPITAL_COMMUNITY): Payer: Self-pay

## 2018-06-18 ENCOUNTER — Other Ambulatory Visit: Payer: Self-pay

## 2018-06-18 ENCOUNTER — Inpatient Hospital Stay
Admission: EM | Admit: 2018-06-18 | Discharge: 2018-06-22 | DRG: 070 | Disposition: A | Discharge status: Home Health | Payer: Medicare HMO | Attending: Internal Medicine | Admitting: Internal Medicine

## 2018-06-18 DIAGNOSIS — G9349 Other encephalopathy: Secondary | ICD-10-CM | POA: Diagnosis present

## 2018-06-18 DIAGNOSIS — F17210 Nicotine dependence, cigarettes, uncomplicated: Secondary | ICD-10-CM | POA: Diagnosis present

## 2018-06-18 DIAGNOSIS — E876 Hypokalemia: Secondary | ICD-10-CM | POA: Diagnosis present

## 2018-06-18 DIAGNOSIS — R748 Abnormal levels of other serum enzymes: Secondary | ICD-10-CM | POA: Diagnosis present

## 2018-06-18 DIAGNOSIS — I739 Peripheral vascular disease, unspecified: Secondary | ICD-10-CM | POA: Diagnosis present

## 2018-06-18 DIAGNOSIS — Z882 Allergy status to sulfonamides status: Secondary | ICD-10-CM

## 2018-06-18 DIAGNOSIS — R0602 Shortness of breath: Secondary | ICD-10-CM

## 2018-06-18 DIAGNOSIS — I119 Hypertensive heart disease without heart failure: Secondary | ICD-10-CM | POA: Diagnosis present

## 2018-06-18 DIAGNOSIS — J449 Chronic obstructive pulmonary disease, unspecified: Secondary | ICD-10-CM | POA: Diagnosis present

## 2018-06-18 DIAGNOSIS — R778 Other specified abnormalities of plasma proteins: Secondary | ICD-10-CM

## 2018-06-18 DIAGNOSIS — M810 Age-related osteoporosis without current pathological fracture: Secondary | ICD-10-CM | POA: Diagnosis present

## 2018-06-18 DIAGNOSIS — F509 Eating disorder, unspecified: Secondary | ICD-10-CM | POA: Diagnosis not present

## 2018-06-18 DIAGNOSIS — F419 Anxiety disorder, unspecified: Secondary | ICD-10-CM | POA: Diagnosis present

## 2018-06-18 DIAGNOSIS — R7989 Other specified abnormal findings of blood chemistry: Secondary | ICD-10-CM | POA: Diagnosis present

## 2018-06-18 DIAGNOSIS — K219 Gastro-esophageal reflux disease without esophagitis: Secondary | ICD-10-CM | POA: Diagnosis present

## 2018-06-18 DIAGNOSIS — Z515 Encounter for palliative care: Secondary | ICD-10-CM | POA: Diagnosis not present

## 2018-06-18 DIAGNOSIS — G9340 Encephalopathy, unspecified: Secondary | ICD-10-CM | POA: Diagnosis present

## 2018-06-18 DIAGNOSIS — Z79899 Other long term (current) drug therapy: Secondary | ICD-10-CM

## 2018-06-18 DIAGNOSIS — J9621 Acute and chronic respiratory failure with hypoxia: Secondary | ICD-10-CM | POA: Diagnosis present

## 2018-06-18 DIAGNOSIS — J9801 Acute bronchospasm: Secondary | ICD-10-CM | POA: Diagnosis not present

## 2018-06-18 DIAGNOSIS — E785 Hyperlipidemia, unspecified: Secondary | ICD-10-CM | POA: Diagnosis present

## 2018-06-18 DIAGNOSIS — F329 Major depressive disorder, single episode, unspecified: Secondary | ICD-10-CM | POA: Diagnosis present

## 2018-06-18 DIAGNOSIS — F1290 Cannabis use, unspecified, uncomplicated: Secondary | ICD-10-CM | POA: Diagnosis present

## 2018-06-18 DIAGNOSIS — J189 Pneumonia, unspecified organism: Secondary | ICD-10-CM | POA: Diagnosis not present

## 2018-06-18 DIAGNOSIS — G4700 Insomnia, unspecified: Secondary | ICD-10-CM | POA: Diagnosis present

## 2018-06-18 DIAGNOSIS — J690 Pneumonitis due to inhalation of food and vomit: Secondary | ICD-10-CM | POA: Diagnosis not present

## 2018-06-18 DIAGNOSIS — M4180 Other forms of scoliosis, site unspecified: Secondary | ICD-10-CM | POA: Diagnosis not present

## 2018-06-18 DIAGNOSIS — Z90722 Acquired absence of ovaries, bilateral: Secondary | ICD-10-CM

## 2018-06-18 DIAGNOSIS — D509 Iron deficiency anemia, unspecified: Secondary | ICD-10-CM | POA: Diagnosis not present

## 2018-06-18 DIAGNOSIS — L03115 Cellulitis of right lower limb: Secondary | ICD-10-CM | POA: Diagnosis present

## 2018-06-18 DIAGNOSIS — Z7189 Other specified counseling: Secondary | ICD-10-CM | POA: Diagnosis not present

## 2018-06-18 DIAGNOSIS — J441 Chronic obstructive pulmonary disease with (acute) exacerbation: Secondary | ICD-10-CM | POA: Diagnosis not present

## 2018-06-18 DIAGNOSIS — Z8249 Family history of ischemic heart disease and other diseases of the circulatory system: Secondary | ICD-10-CM

## 2018-06-18 DIAGNOSIS — R4182 Altered mental status, unspecified: Secondary | ICD-10-CM | POA: Diagnosis not present

## 2018-06-18 DIAGNOSIS — L03116 Cellulitis of left lower limb: Secondary | ICD-10-CM | POA: Diagnosis present

## 2018-06-18 DIAGNOSIS — G629 Polyneuropathy, unspecified: Secondary | ICD-10-CM | POA: Diagnosis present

## 2018-06-18 DIAGNOSIS — E43 Unspecified severe protein-calorie malnutrition: Secondary | ICD-10-CM | POA: Diagnosis present

## 2018-06-18 DIAGNOSIS — I2510 Atherosclerotic heart disease of native coronary artery without angina pectoris: Secondary | ICD-10-CM | POA: Diagnosis present

## 2018-06-18 DIAGNOSIS — G894 Chronic pain syndrome: Secondary | ICD-10-CM | POA: Diagnosis present

## 2018-06-18 DIAGNOSIS — R627 Adult failure to thrive: Secondary | ICD-10-CM | POA: Diagnosis present

## 2018-06-18 DIAGNOSIS — J440 Chronic obstructive pulmonary disease with acute lower respiratory infection: Secondary | ICD-10-CM | POA: Diagnosis not present

## 2018-06-18 DIAGNOSIS — Z9111 Patient's noncompliance with dietary regimen: Secondary | ICD-10-CM

## 2018-06-18 DIAGNOSIS — R296 Repeated falls: Secondary | ICD-10-CM | POA: Diagnosis present

## 2018-06-18 DIAGNOSIS — F0631 Mood disorder due to known physiological condition with depressive features: Secondary | ICD-10-CM

## 2018-06-18 DIAGNOSIS — Z981 Arthrodesis status: Secondary | ICD-10-CM

## 2018-06-18 DIAGNOSIS — I10 Essential (primary) hypertension: Secondary | ICD-10-CM | POA: Diagnosis not present

## 2018-06-18 DIAGNOSIS — Z9981 Dependence on supplemental oxygen: Secondary | ICD-10-CM

## 2018-06-18 DIAGNOSIS — Z9049 Acquired absence of other specified parts of digestive tract: Secondary | ICD-10-CM

## 2018-06-18 DIAGNOSIS — I959 Hypotension, unspecified: Secondary | ICD-10-CM | POA: Diagnosis present

## 2018-06-18 DIAGNOSIS — Z6821 Body mass index (BMI) 21.0-21.9, adult: Secondary | ICD-10-CM

## 2018-06-18 DIAGNOSIS — Z86718 Personal history of other venous thrombosis and embolism: Secondary | ICD-10-CM

## 2018-06-18 DIAGNOSIS — Z888 Allergy status to other drugs, medicaments and biological substances status: Secondary | ICD-10-CM

## 2018-06-18 DIAGNOSIS — Z886 Allergy status to analgesic agent status: Secondary | ICD-10-CM

## 2018-06-18 DIAGNOSIS — Z7951 Long term (current) use of inhaled steroids: Secondary | ICD-10-CM

## 2018-06-18 DIAGNOSIS — G609 Hereditary and idiopathic neuropathy, unspecified: Secondary | ICD-10-CM | POA: Diagnosis not present

## 2018-06-18 DIAGNOSIS — Z90710 Acquired absence of both cervix and uterus: Secondary | ICD-10-CM

## 2018-06-18 DIAGNOSIS — F17200 Nicotine dependence, unspecified, uncomplicated: Secondary | ICD-10-CM | POA: Diagnosis present

## 2018-06-18 DIAGNOSIS — Z7902 Long term (current) use of antithrombotics/antiplatelets: Secondary | ICD-10-CM

## 2018-06-18 DIAGNOSIS — Z885 Allergy status to narcotic agent status: Secondary | ICD-10-CM

## 2018-06-18 HISTORY — DX: Encephalopathy, unspecified: G93.40

## 2018-06-18 LAB — COMPREHENSIVE METABOLIC PANEL
ALT: 23 U/L (ref 0–44)
ANION GAP: 11 (ref 5–15)
AST: 21 U/L (ref 15–41)
Albumin: 3.5 g/dL (ref 3.5–5.0)
Alkaline Phosphatase: 178 U/L — ABNORMAL HIGH (ref 38–126)
BILIRUBIN TOTAL: 0.6 mg/dL (ref 0.3–1.2)
BUN: 17 mg/dL (ref 8–23)
CO2: 27 mmol/L (ref 22–32)
Calcium: 8.4 mg/dL — ABNORMAL LOW (ref 8.9–10.3)
Chloride: 100 mmol/L (ref 98–111)
Creatinine, Ser: 0.74 mg/dL (ref 0.44–1.00)
GFR calc Af Amer: >60 mL/min (ref >60)
GFR calc non Af Amer: >60 mL/min (ref >60)
GLUCOSE: 86 mg/dL (ref 70–99)
POTASSIUM: 3.7 mmol/L (ref 3.5–5.1)
SODIUM: 138 mmol/L (ref 135–145)
TOTAL PROTEIN: 6.8 g/dL (ref 6.5–8.1)

## 2018-06-18 LAB — CBC WITH DIFFERENTIAL/PLATELET
Basophils Absolute: 0.1 10*3/uL (ref 0–0.1)
Basophils Relative: 1 %
Eosinophils Absolute: 0.2 10*3/uL (ref 0–0.7)
Eosinophils Relative: 3 %
HEMATOCRIT: 30.8 % — AB (ref 35.0–47.0)
HEMOGLOBIN: 10.2 g/dL — AB (ref 12.0–16.0)
LYMPHS PCT: 8 %
Lymphs Abs: 0.5 10*3/uL — ABNORMAL LOW (ref 1.0–3.6)
MCH: 29.4 pg (ref 26.0–34.0)
MCHC: 33 g/dL (ref 32.0–36.0)
MCV: 89 fL (ref 80.0–100.0)
MONO ABS: 0.7 10*3/uL (ref 0.2–0.9)
Monocytes Relative: 11 %
NEUTROS ABS: 5.1 10*3/uL (ref 1.4–6.5)
NEUTROS PCT: 77 %
Platelets: 246 10*3/uL (ref 150–440)
RBC: 3.46 MIL/uL — ABNORMAL LOW (ref 3.80–5.20)
RDW: 25.6 % — AB (ref 11.5–14.5)
WBC: 6.6 10*3/uL (ref 3.6–11.0)

## 2018-06-18 LAB — URINALYSIS, ROUTINE W REFLEX MICROSCOPIC
Bilirubin Urine: NEGATIVE
GLUCOSE, UA: NEGATIVE mg/dL
Hgb urine dipstick: NEGATIVE
KETONES UR: NEGATIVE mg/dL
Leukocytes, UA: NEGATIVE
Nitrite: NEGATIVE
PH: 5 (ref 5.0–8.0)
Protein, ur: NEGATIVE mg/dL
SPECIFIC GRAVITY, URINE: 1.021 (ref 1.005–1.030)

## 2018-06-18 LAB — URINE DRUG SCREEN, QUALITATIVE (ARMC ONLY)
AMPHETAMINES, UR SCREEN: NOT DETECTED
BENZODIAZEPINE, UR SCRN: POSITIVE — AB
Barbiturates, Ur Screen: NOT DETECTED
CANNABINOID 50 NG, UR ~~LOC~~: NOT DETECTED
Cocaine Metabolite,Ur ~~LOC~~: NOT DETECTED
MDMA (Ecstasy)Ur Screen: NOT DETECTED
Methadone Scn, Ur: NOT DETECTED
OPIATE, UR SCREEN: NOT DETECTED
PHENCYCLIDINE (PCP) UR S: NOT DETECTED
Tricyclic, Ur Screen: NOT DETECTED

## 2018-06-18 LAB — TROPONIN I: Troponin I: 0.11 ng/mL (ref <0.03)

## 2018-06-18 LAB — LACTIC ACID, PLASMA: LACTIC ACID, VENOUS: 1 mmol/L (ref 0.5–1.9)

## 2018-06-18 MED ORDER — NALOXONE HCL 2 MG/2ML IJ SOSY
0.4000 mg | PREFILLED_SYRINGE | INTRAMUSCULAR | Status: DC | PRN
  Filled 2018-06-18: qty 2

## 2018-06-18 MED ORDER — FAMOTIDINE IN NACL 20-0.9 MG/50ML-% IV SOLN
20.0000 mg | Freq: Every day | INTRAVENOUS | Status: AC
  Administered 2018-06-18 – 2018-06-20 (×3): 20 mg via INTRAVENOUS
  Filled 2018-06-18 (×3): qty 50

## 2018-06-18 MED ORDER — FLUTICASONE PROPIONATE 50 MCG/ACT NA SUSP
2.0000 | Freq: Every day | NASAL | Status: DC
  Administered 2018-06-19 – 2018-06-22 (×3): 2 via NASAL
  Filled 2018-06-18: qty 16

## 2018-06-18 MED ORDER — ACETAMINOPHEN 650 MG RE SUPP
650.0000 mg | Freq: Three times a day (TID) | RECTAL | Status: DC | PRN
  Administered 2018-06-18: 650 mg via RECTAL
  Filled 2018-06-18: qty 1

## 2018-06-18 MED ORDER — PIPERACILLIN-TAZOBACTAM 3.375 G IVPB 30 MIN
3.3750 g | Freq: Once | INTRAVENOUS | Status: AC
  Administered 2018-06-18: 3.375 g via INTRAVENOUS
  Filled 2018-06-18: qty 50

## 2018-06-18 MED ORDER — VANCOMYCIN HCL IN DEXTROSE 1-5 GM/200ML-% IV SOLN
1000.0000 mg | Freq: Once | INTRAVENOUS | Status: AC
  Administered 2018-06-18: 1000 mg via INTRAVENOUS
  Filled 2018-06-18: qty 200

## 2018-06-18 MED ORDER — DULOXETINE HCL 30 MG PO CPEP
30.0000 mg | ORAL_CAPSULE | Freq: Every day | ORAL | Status: DC
  Administered 2018-06-19 – 2018-06-20 (×2): 30 mg via ORAL
  Filled 2018-06-18 (×3): qty 1

## 2018-06-18 MED ORDER — VANCOMYCIN HCL 500 MG IV SOLR
500.0000 mg | INTRAVENOUS | Status: DC
  Administered 2018-06-19 – 2018-06-20 (×2): 500 mg via INTRAVENOUS
  Filled 2018-06-18 (×5): qty 500

## 2018-06-18 MED ORDER — ACETAMINOPHEN 325 MG PO TABS: 650.0000 mg | ORAL_TABLET | Freq: Four times a day (QID) | ORAL | Status: DC | PRN

## 2018-06-18 MED ORDER — PIPERACILLIN-TAZOBACTAM 3.375 G IVPB
3.3750 g | Freq: Three times a day (TID) | INTRAVENOUS | Status: DC
  Administered 2018-06-18 – 2018-06-22 (×11): 3.375 g via INTRAVENOUS
  Filled 2018-06-18 (×11): qty 50

## 2018-06-18 MED ORDER — NALOXONE HCL 2 MG/2ML IJ SOSY
PREFILLED_SYRINGE | INTRAMUSCULAR | Status: AC
  Filled 2018-06-18: qty 2

## 2018-06-18 MED ORDER — SODIUM CHLORIDE 0.9 % IV SOLN
INTRAVENOUS | Status: DC
  Administered 2018-06-18 – 2018-06-21 (×4): via INTRAVENOUS

## 2018-06-18 MED ORDER — PANTOPRAZOLE SODIUM 40 MG PO TBEC
40.0000 mg | DELAYED_RELEASE_TABLET | Freq: Every day | ORAL | Status: DC
  Administered 2018-06-19 – 2018-06-21 (×3): 40 mg via ORAL
  Filled 2018-06-18 (×3): qty 1

## 2018-06-18 MED ORDER — ONDANSETRON 4 MG PO TBDP
4.0000 mg | ORAL_TABLET | Freq: Three times a day (TID) | ORAL | Status: DC | PRN
  Filled 2018-06-18: qty 1

## 2018-06-18 MED ORDER — KETOROLAC TROMETHAMINE 30 MG/ML IJ SOLN
15.0000 mg | Freq: Four times a day (QID) | INTRAMUSCULAR | Status: DC | PRN
  Administered 2018-06-18: 21:00:00 15 mg via INTRAVENOUS
  Filled 2018-06-18: qty 1

## 2018-06-18 MED ORDER — ALBUTEROL SULFATE (2.5 MG/3ML) 0.083% IN NEBU
2.5000 mg | INHALATION_SOLUTION | RESPIRATORY_TRACT | Status: DC | PRN
  Administered 2018-06-19: 2.5 mg via RESPIRATORY_TRACT
  Filled 2018-06-18: qty 3

## 2018-06-18 MED ORDER — ORAL CARE MOUTH RINSE
15.0000 mL | Freq: Two times a day (BID) | OROMUCOSAL | Status: DC
  Administered 2018-06-18 – 2018-06-20 (×2): 15 mL via OROMUCOSAL

## 2018-06-18 MED ORDER — AMLODIPINE BESYLATE 5 MG PO TABS
5.0000 mg | ORAL_TABLET | Freq: Every day | ORAL | Status: DC
  Administered 2018-06-18 – 2018-06-22 (×4): 5 mg via ORAL
  Filled 2018-06-18 (×5): qty 1

## 2018-06-18 MED ORDER — SODIUM CHLORIDE 0.9 % IV BOLUS
1000.0000 mL | Freq: Once | INTRAVENOUS | Status: AC
  Administered 2018-06-18: 1000 mL via INTRAVENOUS

## 2018-06-18 MED ORDER — NALOXONE HCL 2 MG/2ML IJ SOSY
0.4000 mg | PREFILLED_SYRINGE | INTRAMUSCULAR | Status: AC
  Administered 2018-06-18: 0.4 mg via INTRAVENOUS

## 2018-06-18 NOTE — ED Notes (Signed)
Pt sleeping   Family with pt.  Pt waiting on admission.   nsr on monitor. Iv in place

## 2018-06-18 NOTE — H&P (Addendum)
[facility] [place] NAME: Cheryl [name]    MR#:  [id number]  DATE OF BIRTH:  1951/08/22  DATE OF ADMISSION:  06/18/2018  PRIMARY CARE PHYSICIAN: Birdie Sons, MD   REQUESTING/REFERRING PHYSICIAN: Dr Kerman Passey  CHIEF COMPLAINT:   Chief Complaint  Patient presents with  . Altered Mental Status    HISTORY OF PRESENT ILLNESS:  Cheryl [name]  is a 67 y.o. female brought in with altered mental status.  She is been vomiting a lot over the last week.  Sunday she had some disorientation.  She has had lower extremity cellulitis.  4 falls since Saturday.  Her legs have severe pain.  Recently put on antibiotics by Dr. Caryn Section for her oxygen level has been low.  He had to force her to put her oxygen on.  One day she did have a fever.  She is been talking out of her head.  In the ER, I had to give the patient a sternal rub and she only moaned.  Husband had noticed some orange material coming out of her nose.  PAST MEDICAL HISTORY:   Past Medical History:  Diagnosis Date  . Allergy   . Anxiety   . Arthritis   . Aspiration pneumonitis ([place]) 11/24/2015  . Asthma   . Chronic pain   . COPD (chronic obstructive pulmonary disease) ([place])   . Coronary artery disease   . DVT (deep venous thrombosis) ([place])   . GERD (gastroesophageal reflux disease)   . Headache   . Hyperlipidemia   . Hypertension   . Kyphoscoliosis deformity of spine   . Migraines   . Neuropathy 2010  . Osteoporosis   . Oxygen deficiency   . Peripheral vascular disease ([place])   . Pneumonia   . Pneumonia 11/19/2015  . Pneumonia 10/2016  . Pneumonia    aspiration 2018- 4 times in last year  . Vitamin D deficiency     PAST SURGICAL HISTORY:   Past Surgical History:  Procedure Laterality Date  . ABDOMINAL HYSTERECTOMY  1975   Bilaterl Oophorectomy; [place] to IUD infection  . abdomnal aortic stent  05/30/2008   Dr. Quay Burow  . APPENDECTOMY    . APPENDECTOMY    .  cardiac catherization  10/31/2009  . CERVICAL FUSION  C5 - 6/C6-7  . CHOLECYSTECTOMY  1972  . COLONOSCOPY WITH PROPOFOL N/A 07/27/2015   Procedure: COLONOSCOPY WITH PROPOFOL;  Surgeon: Hulen Luster, MD;  Location: [facility] ENDOSCOPY;  Service: Gastroenterology;  Laterality: N/A;  . ESOPHAGOGASTRODUODENOSCOPY (EGD) WITH PROPOFOL N/A 07/27/2015   Procedure: ESOPHAGOGASTRODUODENOSCOPY (EGD) WITH PROPOFOL;  Surgeon: Hulen Luster, MD;  Location[facility]COPY;  Service: Gastroenterology;  Laterality: N/A;  . ESOPHAGOGASTRODUODENOSCOPY (EGD) WITH PROPOFOL N/A 05/19/2018   Procedure: ESOPHAGOGASTRODUODENOSCOPY (EGD) WITH PROPOFOL;  Surgeon: Lucilla Lame, MD;  Location: [facility] ENDOSCOPY;  Service: Endoscopy;  Laterality: N/A;  . FOOT SURGERY Bilateral    5-6 years per patient  . SPINE SURGERY      SOCIAL HISTORY:   Social History   Tobacco Use  . Smoking status: Current Every Day Smoker    Packs/day: 1.00    Years: 50.00    Pack years: 50.00    Types: Cigarettes  . Smokeless tobacco: Never Used  . Tobacco comment: Previously smoked 2 ppd  Substance Use Topics  . Alcohol use: No    Alcohol/week: 0.0 oz    FAMILY HISTORY:   Family History  Problem Relation Age of Onset  .  Cancer Mother   . Arthritis Mother   . Heart disease Mother   . Diabetes Mother        mellitus, type 2  . Heart disease Father   . Diabetes Sister   . Cancer Brother   . Cancer Brother        lung  . Diabetes Brother     DRUG ALLERGIES:   Allergies  Allergen Reactions  . Percocet [Oxycodone-Acetaminophen] Hives and Rash  . Aspirin Nausea And Vomiting and Other (See Comments)    Reaction:  GI upset   . Codeine Nausea And Vomiting, Other (See Comments) and Nausea Only    Reaction:  GI upset   . Propoxyphene Other (See Comments) and Nausea Only    GI upset Reaction:  GI upset   . Sulfa Antibiotics Rash and Other (See Comments)    Reaction:  GI upset     REVIEW OF SYSTEMS:  Patient unresponsive to sternal rub and  unable to provide review of systems at this time  MEDICATIONS AT HOME:   Prior to Admission medications   Medication Sig Start Date End Date Taking? Authorizing Provider  alendronate (FOSAMAX) 70 MG tablet Take 1 tablet (70 mg total) by mouth every 7 (seven) days. Take with a full glass of water on an empty stomach. 01/23/18  Yes Birdie Sons, MD  ALPRAZolam Duanne Moron) 0.5 MG tablet Take 0.5 tablets (0.25 mg total) by mouth at bedtime as needed for anxiety. Patient taking differently: Take 0.25-0.5 mg by mouth at bedtime as needed for anxiety.  05/25/18  Yes Birdie Sons, MD  amLODipine (NORVASC) 5 MG tablet Take 1 tablet (5 mg total) by mouth daily. 05/22/18  Yes Alyvia Derk, MD  cephALEXin (KEFLEX) 500 MG capsule Take 1 capsule (500 mg total) by mouth 4 (four) times daily for 10 days. 06/15/18 06/25/18 Yes Birdie Sons, MD  clopidogrel (PLAVIX) 75 MG tablet Take 75 mg by mouth daily.   Yes [provider]  diphenoxylate-atropine (LOMOTIL) 2.5-0.025 MG tablet Take 1 tablet by mouth as needed for diarrhea or loose stools. 06/10/18  Yes [provider]  donepezil (ARICEPT) 10 MG tablet Take 10 mg by mouth at bedtime.   Yes [provider]  DULoxetine (CYMBALTA) 30 MG capsule Take 1 capsule by mouth daily. 01/26/18  Yes [provider]  estradiol (ESTRACE) 0.5 MG tablet Take 1 tablet (0.5 mg total) by mouth daily. (Take in place of estropipate) Patient taking differently: Take 0.5 mg by mouth every other day. (Take in place of estropipate) 02/26/18  Yes Fisher, Kirstie Peri, MD  famotidine (PEPCID) 20 MG tablet Take 1 tablet (20 mg total) by mouth daily. 05/21/18 05/21/19 Yes Shamyra Farias, MD  fentaNYL (DURAGESIC - DOSED MCG/HR) 50 MCG/HR Place 1 patch (50 mcg total) onto the skin every 3 (three) days. 07/21/18 08/20/18 Yes Vevelyn Francois, NP  ferrous sulfate 325 (65 FE) MG tablet Take 1 tablet (325 mg total) by mouth 2 (two) times daily with a meal. 06/05/18  Yes  Dustin Flock, MD  Fluticasone-Salmeterol (ADVAIR DISKUS) 250-50 MCG/DOSE AEPB inhale 1 dose by mouth twice a day 01/23/18  Yes Birdie Sons, MD  furosemide (LASIX) 40 MG tablet Take 1 tablet by mouth daily. 06/16/18  Yes [provider]  ondansetron (ZOFRAN) 4 MG tablet Take 0.5-1 tablets (2-4 mg total) by mouth every 6 (six) hours as needed for nausea. Patient taking differently: Take 4 mg by mouth daily.  05/25/18  Yes  Birdie Sons, MD  pantoprazole (PROTONIX) 40 MG tablet Take 1 tablet (40 mg total) by mouth 2 (two) times daily. Patient taking differently: Take 40 mg by mouth daily.  01/23/18  Yes Birdie Sons, MD  pregabalin (LYRICA) 150 MG capsule Take 150 mg by mouth 2 (two) times daily.   Yes [provider]  simvastatin (ZOCOR) 10 MG tablet Take 10 mg by mouth at bedtime.    Yes [provider]  tiotropium (SPIRIVA HANDIHALER) 18 MCG inhalation capsule inhale the contents of one capsule in the handihaler once daily 01/23/18  Yes Fisher, Kirstie Peri, MD  traZODone (DESYREL) 50 MG tablet Take 1-2 tablets (50-100 mg total) by mouth at bedtime. 05/25/18  Yes Birdie Sons, MD  vitamin B-12 (CYANOCOBALAMIN) 1000 MCG tablet Take 1 tablet (1,000 mcg total) by mouth daily. 05/21/18  Yes Eddison Searls, MD  acetaminophen (TYLENOL) 325 MG tablet Take 2 tablets (650 mg total) by mouth every 6 (six) hours as needed for mild pain (or Fever >/= 101). 05/21/18   Loletha Grayer, MD  albuterol (PROVENTIL) (2.5 MG/3ML) 0.083% nebulizer solution Take 3 mLs (2.5 mg total) by nebulization every 4 (four) hours as needed for wheezing. 12/24/17   Birdie Sons, MD  celecoxib (CELEBREX) 100 MG capsule Take 1 capsule by mouth as needed for pain. 06/10/18   [provider]  cyclobenzaprine (FLEXERIL) 5 MG tablet Take 1 tablet (5 mg total) by mouth 3 (three) times daily as needed for muscle spasms. Patient not taking: Reported on 06/18/2018 02/02/18   Birdie Sons, MD   [facility] 4 MG/0.1ML LIQD nasal spray kit Place 0.4 mg into the nose once.  09/11/17   [provider]  ondansetron (ZOFRAN-ODT) 4 MG disintegrating tablet Take 1 tablet by mouth every 8 (eight) hours as needed. 06/09/18   [provider]  predniSONE (STERAPRED UNI-PAK 21 TAB) 10 MG (21) TBPK tablet Start at 21m taper by 168muntil finished Patient not taking: Reported on 06/16/2018 06/05/18   PaDustin FlockMD  Suvorexant (BELSOMRA) 5 MG TABS Take 1-2 tablets by mouth at bedtime as needed (insomnia). 06/17/18   FiBirdie SonsMD  triamcinolone (KENALOG) 0.025 % ointment Apply 1 application topically 2 (two) times daily. 06/15/18   FiBirdie SonsMD   Medication reconciliation still undergoing.  VITAL SIGNS:  Blood pressure (!) 106/53, pulse 84, temperature 98.4 F (36.9 C), temperature source Oral, resp. rate 10, height _0  (1.473 m), weight 47.2 kg (104 lb), SpO2 100 %.  PHYSICAL EXAMINATION:  GENERAL:  [age].o.-year-old patient lying in the bed with no acute distress.  EYES: Pupils equal, round, reactive to light and accommodation. No scleral icterus.  HEENT: Head atraumatic, normocephalic.  Unable to look into mouth at this time. NECK:  Supple, no jugular venous distention. No thyroid enlargement, no tenderness.  LUNGS: Normal breath sounds bilaterally, no wheezing, rales,rhonchi or crepitation. No use of accessory muscles of respiration.  CARDIOVASCULAR: S1, S2 normal. No murmurs, rubs, or gallops.  ABDOMEN: Soft, nontender, nondistended. Bowel sounds present. No organomegaly or mass.  EXTREMITIES: Trace pedal edema. cyanosis, or clubbing.  NEUROLOGIC: Moans with sternal rub. PSYCHIATRIC: The patient required a sternal rub and only moans.  . Marland KitchenSKIN: Bilateral lower extremity erythema and warmth.  LABORATORY PANEL:   CBC Recent Labs  Lab 06/18/18 1145  WBC 6.6  HGB 10.2*  HCT 30.8*  PLT 246    ------------------------------------------------------------------------------------------------------------------  Chemistries  Recent Labs  Lab 06/18/18  1145  NA 138  K 3.7  CL 100  CO2 27  GLUCOSE 86  BUN 17  CREATININE 0.74  CALCIUM 8.4*  AST 21  ALT 23  ALKPHOS 178*  BILITOT 0.6   ------------------------------------------------------------------------------------------------------------------  Cardiac Enzymes Recent Labs  Lab 06/18/18 1145  TROPONINI 0.11*   ------------------------------------------------------------------------------------------------------------------  RADIOLOGY:  Ct Head Wo Contrast  Result Date: 06/18/2018 CLINICAL DATA:  Multiple falls over the past few days. Change in mental status. EXAM: CT HEAD WITHOUT CONTRAST TECHNIQUE: Contiguous axial images were obtained from the base of the skull through the vertex without intravenous contrast. COMPARISON:  Brain MRI, 05/18/2018.  Head CT, 09/12/2017. FINDINGS: Brain: No evidence of acute infarction, hemorrhage, hydrocephalus, extra-axial collection or mass lesion/mass effect. Patchy areas of white matter hypoattenuation are noted consistent with mild to moderate chronic microvascular ischemic change. Vascular: No hyperdense vessel or unexpected calcification. Skull: Normal. Negative for fracture or focal lesion. Sinuses/Orbits: Visualized globes and orbits are unremarkable. Mucosal thickening opacifies many of the ethmoid air cells. Mild mucosal thickening in left frontal and sphenoid sinuses. Clear mastoid air cells. Other: None. IMPRESSION: 1. No acute intracranial abnormalities. 2. Mild to moderate chronic microvascular ischemic change. 3. Sinus mucosal thickening mostly of the ethmoid air cells. Electronically Signed   By: Lajean Manes M.D.   On: 06/18/2018 12:59   Dg Chest Port 1 View  Result Date: 06/18/2018 CLINICAL DATA:  Cough, fall, knee pain EXAM: PORTABLE CHEST 1 VIEW COMPARISON:  06/15/2018  FINDINGS: Cardiomegaly. Retrocardiac left basilar opacity, likely atelectasis. No confluent opacity on the right. No effusions or pneumothorax. Old healed bilateral rib fractures. No visible acute fracture. IMPRESSION: Cardiomegaly.  Left base atelectasis.  No active disease. Electronically Signed   By: Rolm Baptise M.D.   On: 06/18/2018 13:21   Dg Knee Complete 4 Views Left  Result Date: 06/18/2018 CLINICAL DATA:  Mental status change. EXAM: LEFT KNEE - COMPLETE 4+ VIEW COMPARISON:  No recent prior. FINDINGS: Diffuse degenerative change left knee. Chondrocalcinosis noted. No acute bony abnormality. IMPRESSION: Diffuse degenerative change with chondrocalcinosis. No acute abnormality. Electronically Signed   By: Marcello Moores  Register   On: 06/18/2018 13:21    EKG:   Sinus rhythm 80 bpm  IMPRESSION AND PLAN:   1.  Acute encephalopathy.  She has had numerous presentations with this.  DC fentanyl patch and give a low-dose Narcan dose.  Hold off on pain medications at this time and anything that can cause altered mental status. 2.  Bilateral lower extremity cellulitis.  Every time I see this this is usually chronic venous stasis.  ER physician started antibiotics which I will continue for now. 3.  Orange discharge from the nose likely bloody material from the nose.  Start Flonase nasal spray.  Hold off on blood thinners for right now. 4.  GERD on PPI and H2 blocker. 5.  History of COPD on PRN oxygen.  Continue nebulizer treatments 6.  Weakness physical therapy evaluation 7.  Relative hypotension hold antihypertensives.    All the records are reviewed and case discussed with ED provider. Management plans discussed with the patient, family and they are in agreement.  CODE STATUS: Full code  TOTAL TIME TAKING CARE OF THIS PATIENT: 50 minutes.    Loletha Grayer M.D on 06/18/2018 at 2:14 PM  Between 7am to 6pm - Pager - [phone]  After 6pm call admission pager [phone]  [facility]  [phone]  CC: Primary care physician; Birdie Sons, MD

## 2018-06-18 NOTE — ED Notes (Signed)
Report called to shea rn floor nurse 

## 2018-06-18 NOTE — ED Notes (Signed)
Pt placed in hallway bed for sepsis workup. Portable monitor used in hallway for monitoring.

## 2018-06-18 NOTE — ED Notes (Addendum)
Labels for blood work printed by this Charity fundraiserN however all blood drawn by Fidela SalisburyKayla Cummins, RN.

## 2018-06-18 NOTE — ED Triage Notes (Signed)
Per family pt has change of mental status - signed in for sob. States on antibiotic for her reddened legs. Pt sleeping in w/c in triage.

## 2018-06-18 NOTE — ED Notes (Signed)
Fentanyl patch removed.

## 2018-06-18 NOTE — ED Triage Notes (Signed)
First Nurse Note:  Son states patient has had multiple falls over the past few days.   Patient placed on 3l/[place] because son did not bring home oxygen inside with patient.  Bilateral lower legs reddened.

## 2018-06-18 NOTE — ED Notes (Signed)
Pt to CT / Xray at this time.

## 2018-06-18 NOTE — Consult Note (Signed)
Consultation Note Date: 06/18/2018   Patient Name: Cheryl [name]  DOB: [birth year]  MRN: [id number]  Age / Sex: 67 y.o., female  PCP: Birdie Sons, MD Referring Physician: Loletha Grayer, MD  Reason for Consultation: Establishing goals of care  HPI/Patient Profile: 67 y.o. female  with past medical history of COPD on 3 L O2 as needed, anxiety, arthritis, chronic pain, GERD, HTN, HLD, aspiration pna 4x in 2018, and CAD admitted on 06/18/2018 with AMS. Apparently patient has been admitted multiple times for AMS. Noted 4 adm is last 6 months. Family reports patient has been vomiting over the past few weeks and having periods of confusion. Thought to have lower extremity cellulitis and was started on antibiotics outpatient. She has fallen 4 times since Saturday per family. She has also been complaining of severe pain in her legs. Was given narcan in ED and fentanyl patch removed. Recently admitted 7/8-7/12 for CAP, cellulitis, and sepsis. PMT consulted for [place] d/t multiple admissions.   Clinical Assessment and Goals of Care: I have reviewed medical records including EPIC notes, labs and imaging, received report from RN and Dr. Margaretmary Eddy, assessed the patient and then met at the bedside along with patient, her husband, and her sister, to discuss diagnosis prognosis, GOC, EOL wishes, disposition and options.  I introduced Palliative Medicine as specialized medical care for people living with serious illness. It focuses on providing relief from the symptoms and stress of a serious illness. The goal is to improve quality of life for both the patient and the family.  We discussed a brief life review of the patient. She and her spouse have been married over 72 years. Both retired. Enjoy spending time at home together.   As far as functional and nutritional status, they tell me of good days and bad days. Sometimes she is ambulatory, doing housework and able to  complete all ADLs. Other times, she is unable to ambulate or care for herself. She usually requires a walker for ambulation. Activity limited d/t weakness and shortness of breath. They report a good appetite.    We discussed their current illness and what it means in the larger context of their on-going co-morbidities.  Natural disease trajectory and expectations at EOL were discussed. We discussed multiple hospital admissions, COPD, and multiple events of aspiration pna. We discussed swallowing study from 2017, discussed aspiration risk.  Husband is appreciative of information and asks appropriate questions. However, patient is very focused on diet and only continues to state that she will not drink thickened liquids. We discussed that we can respect her wishes but we needed to discuss potential consequences of them. She becomes frustrated with further questioning and tells me that she does not want to talk about it.   I attempted to elicit values and goals of care important to the patient.  She tells me it is most important to her to be at home and eat and drink what she wants to.   The difference between aggressive medical intervention and comfort care was considered in light of the patient's goals of care. [name] tells me that patient would not want to be intubated; however, patient speaks up then and says "well, I would if it would help". We discussed that we do not know the outcome and it would not change the long-term problems of COPD and aspiration. [name] understands this. Patient shuts her eyes and says she does not want to talk about it.   Advanced directives, concepts specific to code  status, artifical feeding and hydration, and rehospitalization were considered and discussed. Attempted to discuss code status. Patient's husband indicates she should be a DNR; however, patient says she would want aggressive measures short-term. She then refuses to engage further about the topic. [name] tells me he  is HCPOA and if patient were unable to make decisions for herself he knows what her wishes would be. She had previously told him she would never want to be on the ventilator.   Hospice and Palliative Care services outpatient were explained and offered. Explained that hospice care at home would be in line with patient's goals "to be at home and eat and drink what she wants". Patient immediately replies "I do not want hospice, I want my nurse and therapist I know".   Questions and concerns were addressed. The family was encouraged to call with questions or concerns.   Primary Decision Maker PATIENT    SUMMARY OF RECOMMENDATIONS   - introductory palliative conversation - educated patient and family about diagnoses and disease processes - discussed frequent admissions and likeliness of those admissions continuing - patient's husband appears to understand the gravity of the situation and understand that the patient's goals do not align; however, patient refuses to engage in further discussion - will recommend palliative to follow up outpatient for continued GOC and education  Code Status/Advance Care Planning:  Full code   Symptom Management:   Per primary  Palliative Prophylaxis:   Aspiration, Bowel Regimen, Delirium Protocol, Frequent Pain Assessment, Oral Care and Turn Reposition  Additional Recommendations (Limitations, Scope, Preferences):  Full Scope Treatment  Psycho-social/Spiritual:   Desire for further Chaplaincy support:no  Additional Recommendations: Education on Hospice  Prognosis:   Unable to determine - poor prognosis r/t repeated admissions, aspiration risk and patient refusal to comply with recommended diet, poor functional status  Discharge Planning: Home with Home Health - palliative outpatient     Primary Diagnoses: Present on Admission: . Acute encephalopathy   I have reviewed the medical record, interviewed the patient and family, and examined the  patient. The following aspects are pertinent.  Past Medical History:  Diagnosis Date  . Allergy   . Anxiety   . Arthritis   . Aspiration pneumonitis ([place]) 11/24/2015  . Asthma   . Chronic pain   . COPD (chronic obstructive pulmonary disease) ([place])   . Coronary artery disease   . DVT (deep venous thrombosis) ([place])   . GERD (gastroesophageal reflux disease)   . Headache   . Hyperlipidemia   . Hypertension   . Kyphoscoliosis deformity of spine   . Migraines   . Neuropathy 2010  . Osteoporosis   . Oxygen deficiency   . Peripheral vascular disease ([place])   . Pneumonia   . Pneumonia 11/19/2015  . Pneumonia 10/2016  . Pneumonia    aspiration 2018- 4 times in last year  . Vitamin D deficiency    Social History   Socioeconomic History  . Marital status: Married    Spouse name: Not on file  . Number of children: 6  . Years of education: Not on file  . Highest education level: Not on file  Occupational History  . Occupation: Disabled  Social Needs  . Financial resource strain: Not on file  . Food insecurity:    Worry: Not on file    Inability: Not on file  . Transportation needs:    Medical: Not on file    Non-medical: Not on file  Tobacco Use  . Smoking status:  Current Every Day Smoker    Packs/day: 1.00    Years: 50.00    Pack years: 50.00    Types: Cigarettes  . Smokeless tobacco: Never Used  . Tobacco comment: Previously smoked 2 ppd  Substance and Sexual Activity  . Alcohol use: No    Alcohol/week: 0.0 oz  . Drug use: No  . Sexual activity: Never  Lifestyle  . Physical activity:    Days per week: Not on file    Minutes per session: Not on file  . Stress: Not on file  Relationships  . Social connections:    Talks on phone: Not on file    Gets together: Not on file    Attends religious service: Not on file    Active member of club or organization: Not on file    Attends meetings of clubs or organizations: Not on file    Relationship status: Not on file    Other Topics Concern  . Not on file  Social History Narrative  . Not on file   Family History  Problem Relation Age of Onset  . Cancer Mother   . Arthritis Mother   . Heart disease Mother   . Diabetes Mother        mellitus, type 2  . Heart disease Father   . Diabetes Sister   . Cancer Brother   . Cancer Brother        lung  . Diabetes Brother    Scheduled Meds: . [START ON 06/19/2018] DULoxetine  30 mg Oral Daily  . fluticasone  2 spray Each Nare Daily  . [START ON 06/19/2018] pantoprazole  40 mg Oral Daily   Continuous Infusions: . famotidine (PEPCID) IV    . piperacillin-tazobactam (ZOSYN)  IV    . [START ON 06/19/2018] vancomycin     PRN Meds:.acetaminophen, albuterol, naLOXone (NARCAN)  injection, ondansetron Allergies  Allergen Reactions  . Percocet [Oxycodone-Acetaminophen] Hives and Rash  . Aspirin Nausea And Vomiting and Other (See Comments)    Reaction:  GI upset   . Codeine Nausea And Vomiting, Other (See Comments) and Nausea Only    Reaction:  GI upset   . Propoxyphene Other (See Comments) and Nausea Only    GI upset Reaction:  GI upset   . Sulfa Antibiotics Rash and Other (See Comments)    Reaction:  GI upset    Review of Systems  Constitutional: Positive for activity change and fatigue. Negative for appetite change.  Respiratory: Negative for shortness of breath.   All other systems reviewed and are negative.   Physical Exam  Constitutional: She is oriented to person, place, and time. No distress.  HENT:  Head: Normocephalic and atraumatic.  Cardiovascular: Normal rate and regular rhythm.  Pulmonary/Chest: Effort normal and breath sounds normal.  Abdominal: Soft.  Musculoskeletal:  BLE cellulitis  Neurological: She is alert and oriented to person, place, and time.  Skin: Skin is warm and dry.    Vital Signs: BP (!) 106/53   Pulse 84   Temp 98.4 F (36.9 C) (Oral)   Resp 10   Ht 4' 10" (1.473 m)   Wt 104 lb (47.2 kg)   SpO2 100%   BMI  21.74 kg/m  Pain Scale: 0-10   Pain Score: 0-No pain   SpO2: SpO2: 100 % O2 Device:SpO2: 100 % O2 Flow Rate: .   IO: Intake/output summary: No intake or output data in the 24 hours ending 06/18/18 1447  LBM:   Baseline Weight:  Weight: 104 lb (47.2 kg) Most recent weight: Weight: 104 lb (47.2 kg)     Palliative Assessment/Data: PPS 40%     Time Total: 85 minutes Greater than 50%  of this time was spent counseling and coordinating care related to the above assessment and plan.  Juel Burrow, DNP, AGNP-C Palliative Medicine Team [phone] Pager: [phone]

## 2018-06-18 NOTE — ED Notes (Signed)
Pt back from CT at this time 

## 2018-06-18 NOTE — ED Notes (Signed)
Date and time results received: 06/18/18 1228  Test: troponin I Critical Value: 0.11  Name of Provider Notified: Dr. Lenard LancePaduchowski  Orders Received? Or Actions Taken?: no new orders at this time

## 2018-06-18 NOTE — Progress Notes (Signed)
CODE SEPSIS - PHARMACY COMMUNICATION  **Broad Spectrum Antibiotics should be administered within 1 hour of Sepsis diagnosis**  Time Code Sepsis Called/Page Received: 1145  Antibiotics Ordered: vancomycin and Zosyn  Time of 1st antibiotic administration: 1219  Additional action taken by pharmacy:   If necessary, Name of Provider/Nurse Contacted:     Valentina Guhristy, Kambrea Carrasco D ,PharmD Clinical Pharmacist  06/18/2018  12:07 PM

## 2018-06-18 NOTE — ED Notes (Signed)
Resumed care from [place] rn.  Pt sleeping husband in with pt.  nsr on monitor.  Iv fuids infused.

## 2018-06-18 NOTE — Progress Notes (Signed)
Pharmacy Antibiotic Note  Cheryl [name] is a 67 y.o. female admitted on 06/18/2018 with cellulitis.  Pharmacy has been consulted for vancomycin and Zosyn dosing.  Plan: Vancomycin 500 mg q18 hrs. Goal trough 15-20 mcg/mL Zosyn 3.375 grams extended infusion q8hrs  Height: 4\' 10"  (147.3 cm) Weight: 104 lb (47.2 kg) IBW/kg (Calculated) : 40.9  Temp (24hrs), Avg:98.4 F (36.9 C), Min:98.4 F (36.9 C), Max:98.4 F (36.9 C)  Recent Labs  Lab 06/12/18 1301 06/18/18 1145 06/18/18 1221  WBC 9.8 6.6  --   CREATININE  --  0.74  --   LATICACIDVEN  --   --  1.0    Estimated Creatinine Clearance: 42.9 mL/min (by C-G formula based on SCr of 0.74 mg/dL).    Allergies  Allergen Reactions  . Percocet [Oxycodone-Acetaminophen] Hives and Rash  . Aspirin Nausea And Vomiting and Other (See Comments)    Reaction:  GI upset   . Codeine Nausea And Vomiting, Other (See Comments) and Nausea Only    Reaction:  GI upset   . Propoxyphene Other (See Comments) and Nausea Only    GI upset Reaction:  GI upset   . Sulfa Antibiotics Rash and Other (See Comments)    Reaction:  GI upset     Antimicrobials this admission: Vanc 07/25 >>  Zosyn 07/25 >>   Dose adjustments this admission:   Microbiology results: 07/25 BCx: sent 07/25 UCx: sent   Thank you for allowing pharmacy to be a part of this patient's care.  Pricilla RiffleAbby K Carren Blakley, PharmD Pharmacy Resident  06/18/2018 2:22 PM

## 2018-06-18 NOTE — ED Notes (Addendum)
Vet, NT at bedside as buddy for in and out cath performed by this nurse. Gown change and peri care done at time of in and out cath.

## 2018-06-18 NOTE — Progress Notes (Signed)
Palliative care:  Consult received. Chart reviewed. Spoke with patient's husband, [name], via telephone. Will hold off on meeting until patient is out of ED. We will plan for meeting tomorrow morning at 0900.  Thank you for this consult.  Gerlean RenShae Lee Janal Haak, DNP, AGNP-C Palliative Medicine Team Team Phone # [phone]  Pager # [phone]

## 2018-06-18 NOTE — ED Notes (Signed)
2 unsuccessful attempts for an IV by this nurse.

## 2018-06-18 NOTE — Progress Notes (Signed)
[facility]  Patient Status: Active  [facility] is providing the following services: SN  If patient discharges after hours, please call [phone].   Cheryl CaseyJason E [name] 06/18/2018, 4:40 PM

## 2018-06-18 NOTE — ED Provider Notes (Addendum)
[facility] Emergency Department Provider Note  Time seen: 11:46 AM  I have reviewed the triage vital signs and the nursing notes.   HISTORY  Chief Complaint Altered Mental Status    HPI Cheryl [name] is a 67 y.o. female with a past medical history of anxiety, asthma, COPD on 3 L of oxygen as needed, CAD, hypertension, hyperlipidemia, presents to the emergency department for altered mental status.  According to family patient has been in and out of the hospital over the past 1 month, states she was last discharged approximately 1 to 2 weeks ago.  Family states at baseline patient drives herself, is ambulatory cleans and cooks for herself is very independent.  Over the past 3 to 4 days patient has been increasingly confused.  With multiple falls over the same time..  Husband states today the patient was trying to fill a glass of water at the oven, he finally brought the patient to the emergency department for evaluation.  States cough which is chronic but does not appear to be any worse than normal.  States she is spitting up brown sputum every once a while he is not sure if this is vomit or mucus.  States her legs are very red swollen.  No known fever at home.  Patient is awake alert, but is disoriented, will only follow some commands.  She does state "just let me go home."   Past Medical History:  Diagnosis Date  . Allergy   . Anxiety   . Arthritis   . Aspiration pneumonitis ([place]) 11/24/2015  . Asthma   . Chronic pain   . COPD (chronic obstructive pulmonary disease) ([place])   . Coronary artery disease   . DVT (deep venous thrombosis) ([place])   . GERD (gastroesophageal reflux disease)   . Headache   . Hyperlipidemia   . Hypertension   . Kyphoscoliosis deformity of spine   . Migraines   . Neuropathy 2010  . Osteoporosis   . Oxygen deficiency   . Peripheral vascular disease ([place])   . Pneumonia   . Pneumonia 11/19/2015  . Pneumonia 10/2016  . Pneumonia     aspiration 2018- 4 times in last year  . Vitamin D deficiency     Patient Active Problem List   Diagnosis Date Noted  . Iron deficiency anemia 06/09/2018  . Acute on chronic respiratory failure ([place]) 06/01/2018  . Gastritis without bleeding   . Hypoxia 05/14/2018  . Generalized weakness 02/08/2018  . Rib fracture 09/19/2017  . Marijuana use 06/05/2017  . Pneumonia 04/29/2017  . Acute postoperative pain 04/14/2017  . Metabolic encephalopathy 51/12/5850  . Hypokalemia 12/23/2016  . Protein-calorie malnutrition, severe 11/06/2016  . Nausea with vomiting   . Lumbar facet syndrome (Location of Primary Source of Pain) (Bilateral) (L>R) 03/20/2016  . Lumbar spondylosis 03/20/2016  . Opiate use (160 MME/Day) 02/28/2016  . Anemia 02/28/2016  . Osteoarthrosis 12/18/2015  . Long term current use of anticoagulant therapy (Plavix) 12/18/2015  . Long term current use of opiate analgesic 12/04/2015  . Fibromyalgia 12/04/2015  . Acute on chronic respiratory failure with hypoxia ([place]) 11/24/2015  . Dysphagia 11/24/2015  . Acute diastolic CHF (congestive heart failure) ([place]) 11/24/2015  . Leukocytosis 11/24/2015  . Abnormal mammogram of right breast 10/11/2015  . Dilated intrahepatic bile duct 10/11/2015  . Lumbar radicular pain (Bilateral) (L>R) (L4) 09/04/2015  . Chronic low back pain (Location of Primary Source of Pain) (Bilateral) (L>R) 09/04/2015  . Encounter for therapeutic drug  level monitoring 09/04/2015  . Uncomplicated opioid dependence ([place]) 09/04/2015  . Chronic pain syndrome 09/04/2015  . Platelet inhibition due to Plavix 09/04/2015  . COPD with acute exacerbation ([place]) 07/31/2015  . Dementia 07/31/2015  . Airway hyperreactivity 07/03/2015  . Back pain, thoracic 07/03/2015  . Excessive falling 07/03/2015  . Alteration in bowel elimination: incontinence 07/03/2015  . Insomnia 07/03/2015  . Decreased testosterone level 07/03/2015  . Leg weakness 07/03/2015  . Menopausal  symptom 07/03/2015  . Migraine 07/03/2015  . Neuropathy ([place]) 07/03/2015  . Fecal occult blood test positive 07/03/2015  . OP (osteoporosis) 07/03/2015  . Panic disorder 07/03/2015  . Compulsive tobacco user syndrome 07/03/2015  . Urinary incontinence 07/03/2015  . Weight loss 07/03/2015  . Essential hypertension 06/06/2015  . GERD (gastroesophageal reflux disease) 06/06/2015  . Hyperlipemia 06/06/2015  . Peripheral nerve disease 04/13/2014  . Anxiety 02/10/2014  . Coronary artery disease 02/10/2014  . Peripheral vascular disease ([place]) 02/10/2014  . Vitamin D deficiency 10/16/2009    Past Surgical History:  Procedure Laterality Date  . ABDOMINAL HYSTERECTOMY  1975   Bilaterl Oophorectomy; [place] to IUD infection  . abdomnal aortic stent  05/30/2008   Dr. Quay Burow  . APPENDECTOMY    . APPENDECTOMY    . cardiac catherization  10/31/2009  . CERVICAL FUSION  C5 - 6/C6-7  . CHOLECYSTECTOMY  1972  . COLONOSCOPY WITH PROPOFOL N/A 07/27/2015   Procedure: COLONOSCOPY WITH PROPOFOL;  Surgeon: Hulen Luster, MD;  Location: [facility] ENDOSCOPY;  Service: Gastroenterology;  Laterality: N/A;  . ESOPHAGOGASTRODUODENOSCOPY (EGD) WITH PROPOFOL N/A 07/27/2015   Procedure: ESOPHAGOGASTRODUODENOSCOPY (EGD) WITH PROPOFOL;  Surgeon: Hulen Luster, MD;  Location: [facility] ENDOSCOPY;  Service: Gastroenterology;  Laterality: N/A;  . ESOPHAGOGASTRODUODENOSCOPY (EGD) WITH PROPOFOL N/A 05/19/2018   Procedure: ESOPHAGOGASTRODUODENOSCOPY (EGD) WITH PROPOFOL;  Surgeon: Lucilla Lame, MD;  Location: [facility] ENDOSCOPY;  Service: Endoscopy;  Laterality: N/A;  . FOOT SURGERY Bilateral    5-6 years per patient  . SPINE SURGERY      Prior to Admission medications   Medication Sig Start Date End Date Taking? Authorizing Provider  acetaminophen (TYLENOL) 325 MG tablet Take 2 tablets (650 mg total) by mouth every 6 (six) hours as needed for mild pain (or Fever >/= 101). 05/21/18   Loletha Grayer, MD  albuterol (PROVENTIL) (2.5  MG/3ML) 0.083% nebulizer solution Take 3 mLs (2.5 mg total) by nebulization every 4 (four) hours as needed for wheezing. 12/24/17   Birdie Sons, MD  alendronate (FOSAMAX) 70 MG tablet Take 1 tablet (70 mg total) by mouth every 7 (seven) days. Take with a full glass of water on an empty stomach. 01/23/18   Birdie Sons, MD  ALPRAZolam Duanne Moron) 0.5 MG tablet Take 0.5 tablets (0.25 mg total) by mouth at bedtime as needed for anxiety. Patient taking differently: Take 0.25-0.5 mg by mouth at bedtime as needed for anxiety.  05/25/18   Birdie Sons, MD  amLODipine (NORVASC) 5 MG tablet Take 1 tablet (5 mg total) by mouth daily. Patient not taking: Reported on 06/15/2018 05/22/18   Loletha Grayer, MD  celecoxib (CELEBREX) 100 MG capsule Take 1 capsule by mouth as needed for pain. 06/10/18   [provider]  cephALEXin (KEFLEX) 500 MG capsule Take 1 capsule (500 mg total) by mouth 4 (four) times daily for 10 days. 06/15/18 06/25/18  Birdie Sons, MD  clopidogrel (PLAVIX) 75 MG tablet Take 75 mg by mouth daily.    [provider]  cyclobenzaprine (  FLEXERIL) 5 MG tablet Take 1 tablet (5 mg total) by mouth 3 (three) times daily as needed for muscle spasms. 02/02/18   Birdie Sons, MD  diphenoxylate-atropine (LOMOTIL) 2.5-0.025 MG tablet Take 1 tablet by mouth as needed for diarrhea or loose stools. 06/10/18   [provider]  donepezil (ARICEPT) 10 MG tablet Take 10 mg by mouth at bedtime.    [provider]  DULoxetine (CYMBALTA) 30 MG capsule Take 1 capsule by mouth daily. 01/26/18   [provider]  estradiol (ESTRACE) 0.5 MG tablet Take 1 tablet (0.5 mg total) by mouth daily. (Take in place of estropipate) Patient taking differently: Take 0.5 mg by mouth every other day. (Take in place of estropipate) 02/26/18   Birdie Sons, MD  famotidine (PEPCID) 20 MG tablet Take 1 tablet (20 mg total) by mouth daily. 05/21/18 05/21/19  Loletha Grayer, MD  fentaNYL  (DURAGESIC - DOSED MCG/HR) 50 MCG/HR Place 1 patch (50 mcg total) onto the skin every 3 (three) days. 07/21/18 08/20/18  Vevelyn Francois, NP  ferrous sulfate 325 (65 FE) MG tablet Take 1 tablet (325 mg total) by mouth 2 (two) times daily with a meal. 06/05/18   Dustin Flock, MD  Fluticasone-Salmeterol (ADVAIR DISKUS) 250-50 MCG/DOSE AEPB inhale 1 dose by mouth twice a day 01/23/18   Birdie Sons, MD  [facility] 4 MG/0.1ML LIQD nasal spray kit Place 0.4 mg into the nose once.  09/11/17   [provider]  ondansetron (ZOFRAN) 4 MG tablet Take 0.5-1 tablets (2-4 mg total) by mouth every 6 (six) hours as needed for nausea. Patient taking differently: Take 4 mg by mouth daily.  05/25/18   Birdie Sons, MD  pantoprazole (PROTONIX) 40 MG tablet Take 1 tablet (40 mg total) by mouth 2 (two) times daily. 01/23/18   Birdie Sons, MD  predniSONE (STERAPRED UNI-PAK 21 TAB) 10 MG (21) TBPK tablet Start at 79m taper by 16muntil finished Patient not taking: Reported on 06/16/2018 06/05/18   PaDustin FlockMD  pregabalin (LYRICA) 150 MG capsule Take 150 mg by mouth 2 (two) times daily.    [provider]  simvastatin (ZOCOR) 10 MG tablet Take 10 mg by mouth at bedtime.     [provider]  Suvorexant (BELSOMRA) 5 MG TABS Take 1-2 tablets by mouth at bedtime as needed (insomnia). 06/17/18   FiBirdie SonsMD  tiotropium (SPIRIVA HANDIHALER) 18 MCG inhalation capsule inhale the contents of one capsule in the handihaler once daily 01/23/18   FiBirdie SonsMD  traZODone (DESYREL) 50 MG tablet Take 1-2 tablets (50-100 mg total) by mouth at bedtime. 05/25/18   FiBirdie SonsMD  triamcinolone (KENALOG) 0.025 % ointment Apply 1 application topically 2 (two) times daily. 06/15/18   FiBirdie SonsMD  vitamin B-12 (CYANOCOBALAMIN) 1000 MCG tablet Take 1 tablet (1,000 mcg total) by mouth daily. 05/21/18   WiLoletha GrayerMD    Allergies  Allergen Reactions  . Percocet  [Oxycodone-Acetaminophen] Hives and Rash  . Aspirin Nausea And Vomiting and Other (See Comments)    Reaction:  GI upset   . Codeine Nausea And Vomiting, Other (See Comments) and Nausea Only    Reaction:  GI upset   . Propoxyphene Other (See Comments) and Nausea Only    GI upset Reaction:  GI upset   . Sulfa Antibiotics Rash and Other (See Comments)    Reaction:  GI upset     Family History  Problem Relation Age of Onset  . Cancer Mother   . Arthritis Mother   . Heart disease Mother   . Diabetes Mother        mellitus, type 2  . Heart disease Father   . Diabetes Sister   . Cancer Brother   . Cancer Brother        lung  . Diabetes Brother     Social History Social History   Tobacco Use  . Smoking status: Current Every Day Smoker    Packs/day: 1.00    Years: 50.00    Pack years: 50.00    Types: Cigarettes  . Smokeless tobacco: Never Used  . Tobacco comment: Previously smoked 2 ppd  Substance Use Topics  . Alcohol use: No    Alcohol/week: 0.0 oz  . Drug use: No    Review of Systems Unable to obtain an adequate/accurate review of systems secondary to altered mental status  ____________________________________________   PHYSICAL EXAM:  VITAL SIGNS: ED Triage Vitals  Enc Vitals Group     BP 06/18/18 1103 (!) 139/56     Pulse Rate 06/18/18 1103 83     Resp 06/18/18 1103 18     Temp 06/18/18 1103 98.4 F (36.9 C)     Temp Source 06/18/18 1103 Oral     SpO2 06/18/18 1103 98 %     Weight 06/18/18 1106 104 lb (47.2 kg)     Height 06/18/18 1106 _0  (1.473 m)     Head Circumference --      Peak Flow --      Pain Score 06/18/18 1106 0     Pain Loc --      Pain Edu? --      Excl. in [place]? --     Constitutional: Patient is alert, she is somnolent, does awaken to voice will attempt to answer some questions, follows some commands.  Does appear confused. Eyes: Mild swelling of the right eye, upon opening the eye it appears to have a normal exam, no conjunctival  injection, extraocular muscles intact, patient states her vision is normal. ENT   Head: Normocephalic and atraumatic   Mouth/Throat: Mucous membranes are moist. Cardiovascular: Normal rate, regular rhythm.  Respiratory: Normal respiratory effort without tachypnea nor retractions. Breath sounds are clea Gastrointestinal: Soft and nontender. No distention.   Musculoskeletal: Moderate edema of the left knee with tenderness to palpation.  Mild to moderate edema of the lower extremities, equal bilaterally with lower extremity erythema equal bilaterally, tender to palpation. Neurologic: Patient speech appears to be normal when she does talk.  Appears to be able to move all extremities.  Unable to get the patient to follow out acute commands for a full neurological exam Skin:  Skin is warm, dry.  Erythematous in the lower extremities Psychiatric: Mood and affect are normal.   ____________________________________________    EKG  EKG reviewed and interpreted by myself shows what appears to be sinus rhythm with a logical interference at 68 bpm with a narrow QRS, normal axis, normal intervals with slight QTC prolongation, nonspecific ST changes.  ____________________________________________    RADIOLOGY  Knee x-ray shows degenerative changes no fracture. Chest x-ray shows cardiomegaly no active disease CT scan of the head shows no acute abnormality.   ____________________________________________   INITIAL IMPRESSION / ASSESSMENT AND PLAN / ED COURSE  Pertinent labs & imaging results that were available during my care of the patient were reviewed by me and considered in my medical decision making (  see chart for details).  She presents to the emergency department for altered mental status and several falls recently.  Patient appears to have cellulitis of her lower extremities on examination.  Differential for altered mental status would include infectious etiology, electrolyte or  metabolic abnormality, CVA or ICH.  We will check labs, cultures, start on antibiotics for cellulitis.  We will obtain CT imaging the head.  Patient does have tenderness and swelling of the left knee will obtain x-ray imaging.  Family agreeable to plan of care.  Patient presents to the emergency department for altered mental status, actively confused.  Patient's work-up is been largely nonrevealing.  CT scans are negative, x-rays are negative.  Patient does have an elevated troponin 0 0.11 which is new compared to her prior labs.  Reassuringly her white blood cell count and lactic acid are normal.  Urinalysis is normal.  On clinical evaluation patient does have significant cellulitis to bilateral lower extremities we will continue to cover with IV antibiotics.  Given the elevated troponin and altered mental status patient will be admitted to the hospital service for continued work-up and treatment.  CRITICAL CARE Performed by: Harvest Dark   Total critical care time: 30 minutes  Critical care time was exclusive of separately billable procedures and treating other patients.  Critical care was necessary to treat or prevent imminent or life-threatening deterioration.  Critical care was time spent personally by me on the following activities: development of treatment plan with patient and/or surrogate as well as nursing, discussions with consultants, evaluation of patient's response to treatment, examination of patient, obtaining history from patient or surrogate, ordering and performing treatments and interventions, ordering and review of laboratory studies, ordering and review of radiographic studies, pulse oximetry and re-evaluation of patient's condition.   ____________________________________________   FINAL CLINICAL IMPRESSION(S) / ED DIAGNOSES  Altered mental status Cellulitis Elevated troponin   Harvest Dark, MD 06/18/18 1345    Harvest Dark, MD 07/09/18 1513

## 2018-06-18 NOTE — Progress Notes (Signed)
Patient ID: Cheryl MargaritaMary George [name], female   DOB: 08/27/1951, 67 y.o.   MRN: [id number]  ACP note  Husband at the bedside and patient not able to participate in this conversation at this time.  Diagnoses: Acute encephalopathy, bilateral lower extremity cellulitis, orange discoloration from the nose, GERD, history of COPD, weakness, relative hypotension  Plan.  Repeated hospitalizations with acute encephalopathy and altered mental status is not a good prognostic sign.  Husband states the patient is a full code.  Will get palliative care consultation for frequent hospitalizations and chronic pain.  Time spent on ACP discussion 17 minutes Dr. Alford Highlandichard Karolynn Infantino

## 2018-06-19 DIAGNOSIS — Z515 Encounter for palliative care: Secondary | ICD-10-CM

## 2018-06-19 DIAGNOSIS — R4182 Altered mental status, unspecified: Secondary | ICD-10-CM

## 2018-06-19 DIAGNOSIS — Z7189 Other specified counseling: Secondary | ICD-10-CM

## 2018-06-19 LAB — CBC
HCT: 27.3 % — ABNORMAL LOW (ref 35.0–47.0)
Hemoglobin: 9.1 g/dL — ABNORMAL LOW (ref 12.0–16.0)
MCH: 29.7 pg (ref 26.0–34.0)
MCHC: 33.5 g/dL (ref 32.0–36.0)
MCV: 88.7 fL (ref 80.0–100.0)
PLATELETS: 183 10*3/uL (ref 150–440)
RBC: 3.07 MIL/uL — ABNORMAL LOW (ref 3.80–5.20)
RDW: 25.9 % — AB (ref 11.5–14.5)
WBC: 7.5 10*3/uL (ref 3.6–11.0)

## 2018-06-19 LAB — BASIC METABOLIC PANEL
ANION GAP: 8 (ref 5–15)
BUN: 11 mg/dL (ref 8–23)
CO2: 28 mmol/L (ref 22–32)
CREATININE: 0.73 mg/dL (ref 0.44–1.00)
Calcium: 7.4 mg/dL — ABNORMAL LOW (ref 8.9–10.3)
Chloride: 105 mmol/L (ref 98–111)
GFR calc Af Amer: >60 mL/min (ref >60)
Glucose, Bld: 76 mg/dL (ref 70–99)
Potassium: 3.4 mmol/L — ABNORMAL LOW (ref 3.5–5.1)
Sodium: 141 mmol/L (ref 135–145)

## 2018-06-19 MED ORDER — CLOPIDOGREL BISULFATE 75 MG PO TABS
75.0000 mg | ORAL_TABLET | Freq: Every day | ORAL | Status: DC
  Administered 2018-06-19 – 2018-06-20 (×2): 75 mg via ORAL
  Filled 2018-06-19 (×4): qty 1

## 2018-06-19 MED ORDER — MOMETASONE FURO-FORMOTEROL FUM 200-5 MCG/ACT IN AERO
2.0000 | INHALATION_SPRAY | Freq: Two times a day (BID) | RESPIRATORY_TRACT | Status: DC
  Administered 2018-06-19 – 2018-06-22 (×5): 2 via RESPIRATORY_TRACT
  Filled 2018-06-19: qty 8.8

## 2018-06-19 MED ORDER — ENOXAPARIN SODIUM 40 MG/0.4ML ~~LOC~~ SOLN
40.0000 mg | SUBCUTANEOUS | Status: DC
  Administered 2018-06-19 – 2018-06-21 (×3): 40 mg via SUBCUTANEOUS
  Filled 2018-06-19 (×3): qty 0.4

## 2018-06-19 MED ORDER — FENTANYL 12 MCG/HR TD PT72
12.5000 ug | MEDICATED_PATCH | TRANSDERMAL | Status: DC
  Administered 2018-06-20: 12.5 ug via TRANSDERMAL
  Filled 2018-06-19: qty 1

## 2018-06-19 MED ORDER — TIOTROPIUM BROMIDE MONOHYDRATE 18 MCG IN CAPS
18.0000 ug | ORAL_CAPSULE | Freq: Every day | RESPIRATORY_TRACT | Status: DC
  Administered 2018-06-20 – 2018-06-22 (×2): 18 ug via RESPIRATORY_TRACT
  Filled 2018-06-19: qty 5

## 2018-06-19 NOTE — Progress Notes (Signed)
Pt called out that she had woken up short of breath.  Pulse ox 86% on 2 L. Increased oxygen to 4 L with O2 Sat increased to 90% and called respiratory.  Told to increase to 6 L.  Pt heart rate is irregular at this time- 70-89.  Pt given breathing tx and oxygen level went to 100%.  After tx, oxygen came down to 95-96% on 4 L.  Pt falling back to sleep at this time. Easily arousable.  Henriette CombsSarah Allysson Rinehimer RN

## 2018-06-19 NOTE — Progress Notes (Addendum)
New referral for outpatient Palliative to follow at [facility] following a Palliative Medicine consult. Patient is currently followed by [facility], which will continue at discharge. No d/c date at this time. Patient information faxed to referral.  Update: Patient is currently followed by outpatient PALLIATIVE at home. CMRN Steward DroneBrenda and NP Harvest DarkShae Shaffer made aware.  Dayna BarkerKaren Robertson RN, BSN, [facility] [place],, hospital Liaison [phone]

## 2018-06-19 NOTE — Evaluation (Signed)
Physical Therapy Evaluation Patient Details Name: Cheryl [name] MRN: [id number] DOB: [birth year] Today's Date: 06/19/2018   History of Present Illness  Patient is 67 y.o. female presented to ED with AMS, admitted for acute encephalopathy and b/l LE cellulitis with PMH of anxiety, LE asthma, COPD on 3 L of oxygen as needed, CAD, hypertension, hyperlipidemia, DVT, kyphoscoliosis, c-fusion, chronic low back pain, fibromyalgia,  Clinical Impression  Patient lethargic at start of session, oriented to self, place, and situation. Patient need multimodal cues to arouse and answer questions about PLOF, patient poor historian due to fatigue/lethargy. Patient reports that she lives with son in 1 story home, and uses a 4WW at home, unable to verify, no family at bedside. Per chart review, family reports the patient has fallen 4x since Saturday and needs varying levels of assistance with ADLs and mobility depending on the day. The patient needed encouragement to participate today, but able to perform supine to sit with minAx1, transfer with CGA and ambulated in room ~568ft, with vitals stable and no complaints of SOB/dizziness. Gait speed slow, shuffling steps, poor posture. Patient became agitated when encouraged to sit up in chair or to ambulate further with PT, reports b/l leg pain and fatigue. Returned to supine in bed with supervision. The patient would benefit from further skilled PT to address limitations in gait, balance, mobility, endurance, activity tolerance and to maximize independence and safety.    Follow Up Recommendations SNF    Equipment Recommendations  Other (comment)(Unclear of patient needs due to patient being a poor historian. Would benefit from RW if needed.)    Recommendations for Other Services       Precautions / Restrictions Precautions Precautions: Fall Restrictions Weight Bearing Restrictions: No      Mobility  Bed Mobility Overal bed mobility: Needs Assistance Bed  Mobility: Supine to Sit     Supine to sit: Min assist     General bed mobility comments: Patient needed cues to perform task, assistance with positioning LE and trunk elevation.   Transfers Overall transfer level: Needs assistance   Transfers: Sit to/from Stand Sit to Stand: Min guard            Ambulation/Gait Ambulation/Gait assistance: Min guard Gait Distance (Feet): 8 Feet Assistive device: Rolling walker (2 wheeled) Gait Pattern/deviations: Decreased stride length;Shuffle     General Gait Details: Patient demonstrated shuffling step, decreased speed. Patient deferred further mobility, became slightly agitated and wanted to return to bed.  Stairs            Wheelchair Mobility    Modified Rankin (Stroke Patients Only)       Balance Overall balance assessment: Needs assistance Sitting-balance support: Feet supported;Bilateral upper extremity supported Sitting balance-Leahy Scale: Poor   Postural control: Other (comment)(Fwd lean)   Standing balance-Leahy Scale: Poor                               Pertinent Vitals/Pain Pain Assessment: 0-10 Pain Score: 3  Pain Location: low back pain Pain Descriptors / Indicators: Constant Pain Intervention(s): Limited activity within patient's tolerance;Monitored during session;Repositioned    Home Living Family/patient expects to be discharged to:: Private residence Living Arrangements: Spouse/significant other Available Help at Discharge: Family;Available 24 hours/day Type of Home: House       Home Layout: One level Home Equipment: Walker - 2 wheels Additional Comments: Patient poor historian due to lethargy/fatigue, unable to confirm with no family at bedside  Prior Function                 Hand Dominance        Extremity/Trunk Assessment   Upper Extremity Assessment Upper Extremity Assessment: Defer to OT evaluation;Generalized weakness;RUE deficits/detail;LUE  deficits/detail RUE Deficits / Details: grossly 3+/5 LUE Deficits / Details: grossly 3+/5    Lower Extremity Assessment Lower Extremity Assessment: Generalized weakness;RLE deficits/detail;LLE deficits/detail RLE Deficits / Details: at least 3+/5, patient able to perform heel slides, SLR LLE Deficits / Details: at least 3+/5, patient able to perform heel slides/ SLR       Communication   Communication: No difficulties  Cognition Arousal/Alertness: Lethargic   Overall Cognitive Status: No family/caregiver present to determine baseline cognitive functioning                                 General Comments: Patient became agitated with suggestion of up to chair, and continued ambulation.       General Comments      Exercises     Assessment/Plan    PT Assessment Patient needs continued PT services  PT Problem List Decreased strength;Decreased range of motion;Decreased activity tolerance;Decreased balance;Decreased mobility;Cardiopulmonary status limiting activity;Decreased knowledge of precautions;Decreased safety awareness;Decreased knowledge of use of DME       PT Treatment Interventions DME instruction;Balance training;Gait training;Neuromuscular re-education;Stair training;Functional mobility training;Patient/family education;Therapeutic activities;Therapeutic exercise    PT Goals (Current goals can be found in the Care Plan section)  Acute Rehab PT Goals Patient Stated Goal: Patient wants to go home PT Goal Formulation: With patient Time For Goal Achievement: 07/03/18 Potential to Achieve Goals: Fair    Frequency Min 2X/week   Barriers to discharge        Co-evaluation               AM-PAC PT "6 Clicks" Daily Activity  Outcome Measure Difficulty turning over in bed (including adjusting bedclothes, sheets and blankets)?: Unable Difficulty moving from lying on back to sitting on the side of the bed? : Unable Difficulty sitting down on and  standing up from a chair with arms (e.g., wheelchair, bedside commode, etc,.)?: Unable Help needed moving to and from a bed to chair (including a wheelchair)?: A Little Help needed walking in hospital room?: A Lot Help needed climbing 3-5 steps with a railing? : A Lot 6 Click Score: 10    End of Session Equipment Utilized During Treatment: Gait belt;Oxygen Activity Tolerance: Patient limited by fatigue;Treatment limited secondary to agitation Patient left: in bed;with nursing/sitter in room;with call bell/phone within reach;with bed alarm set Nurse Communication: Mobility status PT Visit Diagnosis: Unsteadiness on feet (R26.81);History of falling (Z91.81);Muscle weakness (generalized) (M62.81)    Time: 1610-9604 PT Time Calculation (min) (ACUTE ONLY): 24 min   Charges:   PT Evaluation $PT Eval Low Complexity: 1 Low PT Treatments $Therapeutic Activity: 8-22 mins       Olga Coaster PT, DPT 1:11 PM,06/19/18 [phone]

## 2018-06-19 NOTE — Progress Notes (Signed)
[facility] [place] at [facility]   PATIENT NAME: Cheryl DimmerMary [name]    MR#:  [id number]  DATE OF BIRTH:  [birth year]  SUBJECTIVE:  CHIEF COMPLAINT: Patient is more awake and alert and answering questions appropriately.  Seen by speech therapy and she is refusing nectar thick liquids as recommended by them, she prefers eating whatever she likes and pretty adamant on getting a regular diet.  Husband at bedside.  Seen by palliative care.  REVIEW OF SYSTEMS:  CONSTITUTIONAL: No fever, fatigue or weakness.  EYES: No blurred or double vision.  EARS, NOSE, AND THROAT: No tinnitus or ear pain.  RESPIRATORY: No cough, shortness of breath, wheezing or hemoptysis.  CARDIOVASCULAR: No chest pain, orthopnea, edema.  GASTROINTESTINAL: No nausea, vomiting, diarrhea or abdominal pain.  GENITOURINARY: No dysuria, hematuria.  ENDOCRINE: No polyuria, nocturia,  HEMATOLOGY: No anemia, easy bruising or bleeding SKIN: Bilateral lower extremity  redness MUSCULOSKELETAL: No joint pain or arthritis.   NEUROLOGIC: No tingling, numbness, weakness.  PSYCHIATRY: No anxiety or depression.   DRUG ALLERGIES:   Allergies  Allergen Reactions  . Percocet [Oxycodone-Acetaminophen] Hives and Rash  . Aspirin Nausea And Vomiting and Other (See Comments)    Reaction:  GI upset   . Codeine Nausea And Vomiting, Other (See Comments) and Nausea Only    Reaction:  GI upset   . Propoxyphene Other (See Comments) and Nausea Only    GI upset Reaction:  GI upset   . Sulfa Antibiotics Rash and Other (See Comments)    Reaction:  GI upset     VITALS:  Blood pressure (!) 119/43, pulse 85, temperature 98.9 F (37.2 C), temperature source Oral, resp. rate 16, height 4\' 10"  (1.473 m), weight 47.2 kg (104 lb), SpO2 97 %.  PHYSICAL EXAMINATION:  GENERAL:  67 y.o.-year-old patient lying in the bed with no acute distress.  EYES: Pupils equal, round, reactive to light and accommodation. No scleral icterus.  Extraocular muscles intact.  HEENT: Head atraumatic, normocephalic. Oropharynx and nasopharynx clear.  NECK:  Supple, no jugular venous distention. No thyroid enlargement, no tenderness.  LUNGS: Normal breath sounds bilaterally, no wheezing, rales,rhonchi or crepitation. No use of accessory muscles of respiration.  CARDIOVASCULAR: S1, S2 normal. No murmurs, rubs, or gallops.  ABDOMEN: Soft, nontender, nondistended. Bowel sounds present. No organomegaly or mass.  EXTREMITIES: Bilateral lower extremity erythema and warmth  At her baseline nEUROLOGIC: Cranial nerves II through XII are intact. Muscle strength at her baseline  in all extremities. Sensation intact. Gait not checked.  PSYCHIATRIC: The patient is alert and oriented x 3.  SKIN: No obvious rash, lesion, or ulcer.    LABORATORY PANEL:   CBC Recent Labs  Lab 06/19/18 0532  WBC 7.5  HGB 9.1*  HCT 27.3*  PLT 183   ------------------------------------------------------------------------------------------------------------------  Chemistries  Recent Labs  Lab 06/18/18 1145 06/19/18 0532  NA 138 141  K 3.7 3.4*  CL 100 105  CO2 27 28  GLUCOSE 86 76  BUN 17 11  CREATININE 0.74 0.73  CALCIUM 8.4* 7.4*  AST 21  --   ALT 23  --   ALKPHOS 178*  --   BILITOT 0.6  --    ------------------------------------------------------------------------------------------------------------------  Cardiac Enzymes Recent Labs  Lab 06/18/18 1145  TROPONINI 0.11*   ------------------------------------------------------------------------------------------------------------------  RADIOLOGY:  Ct Head Wo Contrast  Result Date: 06/18/2018 CLINICAL DATA:  Multiple falls over the past few days. Change in mental status. EXAM: CT HEAD WITHOUT CONTRAST TECHNIQUE: Contiguous axial images  were obtained from the base of the skull through the vertex without intravenous contrast. COMPARISON:  Brain MRI, 05/18/2018.  Head CT, 09/12/2017. FINDINGS:  Brain: No evidence of acute infarction, hemorrhage, hydrocephalus, extra-axial collection or mass lesion/mass effect. Patchy areas of white matter hypoattenuation are noted consistent with mild to moderate chronic microvascular ischemic change. Vascular: No hyperdense vessel or unexpected calcification. Skull: Normal. Negative for fracture or focal lesion. Sinuses/Orbits: Visualized globes and orbits are unremarkable. Mucosal thickening opacifies many of the ethmoid air cells. Mild mucosal thickening in left frontal and sphenoid sinuses. Clear mastoid air cells. Other: None. IMPRESSION: 1. No acute intracranial abnormalities. 2. Mild to moderate chronic microvascular ischemic change. 3. Sinus mucosal thickening mostly of the ethmoid air cells. Electronically Signed   By: Amie Portland M.D.   On: 06/18/2018 12:59   Dg Chest Port 1 View  Result Date: 06/18/2018 CLINICAL DATA:  Cough, fall, knee pain EXAM: PORTABLE CHEST 1 VIEW COMPARISON:  06/15/2018 FINDINGS: Cardiomegaly. Retrocardiac left basilar opacity, likely atelectasis. No confluent opacity on the right. No effusions or pneumothorax. Old healed bilateral rib fractures. No visible acute fracture. IMPRESSION: Cardiomegaly.  Left base atelectasis.  No active disease. Electronically Signed   By: Charlett Nose M.D.   On: 06/18/2018 13:21   Dg Knee Complete 4 Views Left  Result Date: 06/18/2018 CLINICAL DATA:  Mental status change. EXAM: LEFT KNEE - COMPLETE 4+ VIEW COMPARISON:  No recent prior. FINDINGS: Diffuse degenerative change left knee. Chondrocalcinosis noted. No acute bony abnormality. IMPRESSION: Diffuse degenerative change with chondrocalcinosis. No acute abnormality. Electronically Signed   By: Maisie Fus  Register   On: 06/18/2018 13:21    EKG:   Orders placed or performed during the hospital encounter of 06/18/18  . ED EKG 12-Lead  . ED EKG 12-Lead    ASSESSMENT AND PLAN:     1.  Acute encephalopathy.    Resolved back to her baseline  according to her husband .she has had numerous presentations with this.  DCed  fentanyl patch at the time of admission and received a low-dose Narcan dose.    Will start the patient on fentanyl 12.5 mcg patch at this time to prevent narcotic withdrawal.  Patient takes 50 mcg at home which is discontinued  2.  Bilateral lower extremity cellulitis on chronic venous stasis.  ER physician started antibiotics, will continue for now to see if there will be some clinical progress  3.  Orange discharge from the nose likely bloody material from the nose.  Start Flonase nasal spray.   . 4.  GERD on PPI and H2 blocker.  5.  History of COPD on PRN oxygen.  Continue nebulizer treatments  6.  Weakness physical therapy evaluation-recommending skilled nursing facility but patient prefers going home with home health  7.  Relative hypotension hold antihypertensives.  Blood pressure is okay  8.  Aspiration-silent-speech therapy evaluation with a diagnosis of silent aspiration recommending nectar thick liquids but patient is refusing she prefers getting regular diet and understands the consequences of following modified diet as recommended by speech therapy.  Failure to thrive with recurrent admissions-seen by palliative care, recommending outpatient palliative care follow-up.  Full code for now  All the records are reviewed and case discussed with Care Management/Social Workerr. Management plans discussed with the patient,  husband at bedside and they are in agreement.  CODE STATUS:  fc  TOTAL TIME TAKING CARE OF THIS PATIENT: 39 minutes.   POSSIBLE D/C IN 1-2 DAYS, DEPENDING ON CLINICAL CONDITION.  Note: This dictation was prepared with Dragon dictation along with smaller phrase technology. Any transcriptional errors that result from this process are unintentional.   Ramonita Lab M.D on 06/19/2018 at 9:39 PM  Between 7am to 6pm - Pager - [phone] After 6pm go to [email or web address] - password [email or web address]  [facility]  [place] [place] [facility]  Office  [phone]  CC: Primary care physician; Malva Limes, MD

## 2018-06-19 NOTE — Clinical Social Work Note (Signed)
Clinical Social Work Assessment  Patient Details  Name: Cheryl [name] MRN: [id number] Date of Birth: 11/07/1951  Date of referral:  06/19/18               Reason for consult:  Facility Placement                Permission sought to share information with:  Case Manager, Facility Contact Representative, Family Supports Permission granted to share information::  Yes, Verbal Permission Granted  Name::        Agency::     Relationship::     Contact Information:     Housing/Transportation Living arrangements for the past 2 months:  Single Family Home Source of Information:  Spouse Patient Interpreter Needed:  None Criminal Activity/Legal Involvement Pertinent to Current Situation/Hospitalization:  No - Comment as needed Significant Relationships:  Spouse Lives with:  Spouse Do you feel safe going back to the place where you live?  Yes Need for family participation in patient care:  Yes (Comment)  Care giving concerns:  Patient lives with spouse in [place]    Social Worker assessment / plan: CSW received consult for SNF placement. CSW met with patient and husband Frank Telford at bedside. CSW introduced self and explained role. CSW explained that PT is recommending SNF placement for patient. Husband states that they are already receiving services from [facility] and he would prefer to take patient home. CSW notified RN CM of above. CSW signing off. Please reconsult if further needs arise.   Employment status:  Retired Insurance information:  Managed Medicare PT Recommendations:  Skilled Nursing Facility Information / Referral to community resources:  Skilled Nursing Facility  Patient/Family's Response to care:  Patient slept through interview   Patient/Family's Understanding of and Emotional Response to Diagnosis, Current Treatment, and Prognosis:  Spouse thanked CSW for assistance and is accepting of discharge home   Emotional Assessment Appearance:  Appears older than  stated age Attitude/Demeanor/Rapport:  Unresponsive Affect (typically observed):    Orientation:  Oriented to Self, Oriented to Place Alcohol / Substance use:  Not Applicable Psych involvement (Current and /or in the community):  No (Comment)  Discharge Needs  Concerns to be addressed:  Discharge Planning Concerns Readmission within the last 30 days:  Yes Current discharge risk:  None Barriers to Discharge:  Continued Medical Work up     , LCSWA 06/19/2018, 4:50 PM  

## 2018-06-19 NOTE — Evaluation (Signed)
Clinical/Bedside Swallow Evaluation Patient Details  Name: Cheryl [name] MRN: [id number] Date of Birth: Nov 05, 1951  Today's Date: 06/19/2018 Time: SLP Start Time (ACUTE ONLY): 0810 SLP Stop Time (ACUTE ONLY): 0900 SLP Time Calculation (min) (ACUTE ONLY): 50 min  Past Medical History:  Past Medical History:  Diagnosis Date  . Allergy   . Anxiety   . Arthritis   . Aspiration pneumonitis (HCC) 11/24/2015  . Asthma   . Chronic pain   . COPD (chronic obstructive pulmonary disease) (HCC)   . Coronary artery disease   . DVT (deep venous thrombosis) (HCC)   . GERD (gastroesophageal reflux disease)   . Headache   . Hyperlipidemia   . Hypertension   . Kyphoscoliosis deformity of spine   . Migraines   . Neuropathy 2010  . Osteoporosis   . Oxygen deficiency   . Peripheral vascular disease (HCC)   . Pneumonia   . Pneumonia 11/19/2015  . Pneumonia 10/2016  . Pneumonia    aspiration 2018- 4 times in last year  . Vitamin D deficiency    Past Surgical History:  Past Surgical History:  Procedure Laterality Date  . ABDOMINAL HYSTERECTOMY  1975   Bilaterl Oophorectomy; Dur to IUD infection  . abdomnal aortic stent  05/30/2008   Dr. Nanetta Batty  . APPENDECTOMY    . APPENDECTOMY    . cardiac catherization  10/31/2009  . CERVICAL FUSION  C5 - 6/C6-7  . CHOLECYSTECTOMY  1972  . COLONOSCOPY WITH PROPOFOL N/A 07/27/2015   Procedure: COLONOSCOPY WITH PROPOFOL;  Surgeon: Wallace Cullens, MD;  Location: [facility] ENDOSCOPY;  Service: Gastroenterology;  Laterality: N/A;  . ESOPHAGOGASTRODUODENOSCOPY (EGD) WITH PROPOFOL N/A 07/27/2015   Procedure: ESOPHAGOGASTRODUODENOSCOPY (EGD) WITH PROPOFOL;  Surgeon: Wallace Cullens, MD;  Location: [facility] ENDOSCOPY;  Service: Gastroenterology;  Laterality: N/A;  . ESOPHAGOGASTRODUODENOSCOPY (EGD) WITH PROPOFOL N/A 05/19/2018   Procedure: ESOPHAGOGASTRODUODENOSCOPY (EGD) WITH PROPOFOL;  Surgeon: Midge Minium, MD;  Location: [facility] ENDOSCOPY;  Service: Endoscopy;  Laterality:  N/A;  . FOOT SURGERY Bilateral    5-6 years per patient  . SPINE SURGERY     HPI:  Pt is a 67 y.o. female with a known history of MULTIPLE medical issues including GERD, Dementia, COPD, CAD, DVT, HLD, Htn, PVD, Anxiety. She has had multiple recent admissions, one at the first of the month, was admitted to hospital last week for pneumonia, had not been eating, so EGD was done which described Gastritis. Pt was d/c'd on ABX but she had returned feel SOB, fever and with yellowish sputum. This admission, pt was exhibiting N/V for ~1 week, confusion, falls per family. She admitted w/ dx of acute encephalopathy.  OF note, pt does have longstanding Esophageal phase issuses of GERD - suspect this could be related to pt's multiple episodes of Pneumonia(possible regurgitation?).    Assessment / Plan / Recommendation Clinical Impression  Pt appears to present w/ oropharyngeal phase dysphagia w/ significant Esophageal phase issues including recent gastritis and baseline GERD("for many years"). During brief trials, pt exhibited a delayed(slight) overt cough post trial of thin liquids via Cup. Pt ademently refused trials of Nectar liquids. She consumed few trials of puree type food and 2 trials of the graham cracker mixed for easier munching; no overt s/s of aspiration noted w/ these trials. Given time she cleared. No other oral phase deficits noted except min increased time needed for gumming/mashing solids d/t Edentulous status. Pt consumed trials of Single Ice Chips w/out difficulty or s/s of aspiration. Pt required  feeding support; she has a hunched over posture d/t the kyphoscoliosis. Pt and family present were edcuated on the risk for aspiration moreso of thin liquids; risk for Pulmonary decline from oropharyngeal aspiration. Pt does appear at risk for aspiration from an Esophageal phase of regurgitation of REFLUX material which could significantly impact her Pulmonary status. Informed MD and Palliative Care team;  NSG. Education given to pt/family in room on risk for aspiration from oropharyngeal and Esophageal phase dysphagia. Pt adamently reports she wants to drink/eat what she wants to. Recommend further discussion w/ Palliative Care team. MD updated and will address oral diet w/ pt/family. No further skilled ST services indicated at this time as pt appears at her baseline as per family w/ regard to her swallowing and her desire for drinking/eating what she wants to. NSG updated. Recommend frequent oral care for hygiene and stimulation of swallowing. SLP Visit Diagnosis: Dysphagia, oropharyngeal phase (R13.12)(Esophageal phase dysphagia)    Aspiration Risk  Mild aspiration risk;Moderate aspiration risk    Diet Recommendation  NPO status until seen by MD; recommend general aspiration precautions w/ oral care(frequent oral care for hygiene and stim via swabs)  Medication Administration: Via alternative means    Other  Recommendations Recommended Consults: Consider GI evaluation(continued f/u w/ GI) Oral Care Recommendations: Oral care QID;Staff/trained caregiver to provide oral care   Follow up Recommendations None      Frequency and Duration (n/a)  (n/a)       Prognosis Prognosis for Safe Diet Advancement: Guarded Barriers to Reach Goals: Cognitive deficits;Severity of deficits      Swallow Study   General Date of Onset: 06/18/18 HPI: Pt is a 67 y.o. female with a known history of MULTIPLE medical issues including GERD, Dementia, COPD, CAD, DVT, HLD, Htn, PVD, Anxiety. She has had multiple recent admissions, one at the first of the month, was admitted to hospital last week for pneumonia, had not been eating, so EGD was done which described Gastritis. Pt was d/c'd on ABX but she had returned feel SOB, fever and with yellowish sputum. This admission, pt was exhibiting N/V for ~1 week, confusion, falls per family. She admitted w/ dx of acute encephalopathy.  OF note, pt does have longstanding  Esophageal phase issuses of GERD - suspect this could be related to pt's multiple episodes of Pneumonia(possible regurgitation?).  Type of Study: Bedside Swallow Evaluation Diet Prior to this Study: NPO(regular diet at home per family) Temperature Spikes Noted: (wbc 7.5; temp 103) Respiratory Status: Nasal cannula(2-5 liters) History of Recent Intubation: No Behavior/Cognition: Uncooperative;Confused;Requires cueing(Awake; baseline Dementia per chart) Oral Cavity Assessment: (could not assess) Oral Care Completed by SLP: Recent completion by staff(attempted b/f trials w/ pt) Oral Cavity - Dentition: Edentulous Vision: (n/a) Self-Feeding Abilities: Needs assist;Needs set up;Total assist Patient Positioning: Postural control adequate for testing(kyphoscoliosis at baseline) Baseline Vocal Quality: Normal(when she wanted to speak) Volitional Cough: Cognitively unable to elicit(noted a congested cough) Volitional Swallow: Unable to elicit    Oral/Motor/Sensory Function Overall Oral Motor/Sensory Function: (appeared [facility] for bolus acceptance and clearing)   Ice Chips Ice chips: Within functional limits Presentation: Spoon(fed; 5 trials)   Thin Liquid Thin Liquid: Impaired Presentation: Cup;Self Fed(1 trial of water(thin liquids)) Oral Phase Impairments: (adequate) Oral Phase Functional Implications: (adequate) Pharyngeal  Phase Impairments: Cough - Delayed(slight delay - congested cough)    Nectar Thick Nectar Thick Liquid: Not tested Other Comments: pt refused   Honey Thick Honey Thick Liquid: Not tested   Puree Puree: Within functional limits Presentation:  Spoon(fed; 5-6 trials)   Solid     Solid: Impaired Presentation: Self Fed(2 trials) Oral Phase Impairments: Impaired mastication(edentulous) Oral Phase Functional Implications: Impaired mastication(edentulous; slow management but cleared) Pharyngeal Phase Impairments: (none)       Jerilynn Som, MS,  CCC-SLP Cheryl [name] 06/19/2018,12:50 PM

## 2018-06-19 NOTE — Progress Notes (Signed)
Pharmacy Antibiotic Note  Cheryl [name] is a 67 y.o. female admitted on 06/18/2018 with cellulitis.  Pharmacy has been consulted for vancomycin and Zosyn dosing.  Plan: Vancomycin 500 mg q18 hrs. Goal trough 10 to 15 mcg/mL Zosyn 3.375 grams extended infusion q8hrs  Height: 4\' 10"  (147.3 cm) Weight: 104 lb (47.2 kg) IBW/kg (Calculated) : 40.9  Temp (24hrs), Avg:100.8 F (38.2 C), Min:97.8 F (36.6 C), Max:103 F (39.4 C)  Recent Labs  Lab 06/18/18 1145 06/18/18 1221 06/19/18 0532  WBC 6.6  --  7.5  CREATININE 0.74  --  0.73  LATICACIDVEN  --  1.0  --     Estimated Creatinine Clearance: 42.9 mL/min (by C-G formula based on SCr of 0.73 mg/dL).    Allergies  Allergen Reactions  . Percocet [Oxycodone-Acetaminophen] Hives and Rash  . Aspirin Nausea And Vomiting and Other (See Comments)    Reaction:  GI upset   . Codeine Nausea And Vomiting, Other (See Comments) and Nausea Only    Reaction:  GI upset   . Propoxyphene Other (See Comments) and Nausea Only    GI upset Reaction:  GI upset   . Sulfa Antibiotics Rash and Other (See Comments)    Reaction:  GI upset     Antimicrobials this admission: Vanc 07/25 >>  Zosyn 07/25 >>   Dose adjustments this admission:   Microbiology results: 07/25 BCx: sent 07/25 UCx: sent   Thank you for allowing pharmacy to be a part of this patient's care.  Carola FrostNathan A Darleene Cumpian, Pharm.D, BCPS Clinical Pharmacist 06/19/2018 1:25 PM

## 2018-06-20 ENCOUNTER — Other Ambulatory Visit: Payer: Self-pay

## 2018-06-20 ENCOUNTER — Inpatient Hospital Stay: Payer: Medicare HMO

## 2018-06-20 LAB — MAGNESIUM: Magnesium: 2 mg/dL (ref 1.7–2.4)

## 2018-06-20 LAB — BASIC METABOLIC PANEL
ANION GAP: 9 (ref 5–15)
BUN: 6 mg/dL — AB (ref 8–23)
CALCIUM: 7.7 mg/dL — AB (ref 8.9–10.3)
CO2: 28 mmol/L (ref 22–32)
CREATININE: 0.63 mg/dL (ref 0.44–1.00)
Chloride: 107 mmol/L (ref 98–111)
GFR calc Af Amer: >60 mL/min (ref >60)
GFR calc non Af Amer: >60 mL/min (ref >60)
GLUCOSE: 96 mg/dL (ref 70–99)
Potassium: 3.3 mmol/L — ABNORMAL LOW (ref 3.5–5.1)
Sodium: 144 mmol/L (ref 135–145)

## 2018-06-20 LAB — URINE CULTURE: CULTURE: NO GROWTH

## 2018-06-20 LAB — VANCOMYCIN, TROUGH: VANCOMYCIN TR: 7 ug/mL — AB (ref 15–20)

## 2018-06-20 MED ORDER — ONDANSETRON HCL 4 MG/2ML IJ SOLN
4.0000 mg | Freq: Four times a day (QID) | INTRAMUSCULAR | Status: DC | PRN
  Administered 2018-06-20: 4 mg via INTRAVENOUS
  Filled 2018-06-20: qty 2

## 2018-06-20 MED ORDER — VANCOMYCIN HCL 500 MG IV SOLR
500.0000 mg | Freq: Two times a day (BID) | INTRAVENOUS | Status: DC
  Administered 2018-06-20 – 2018-06-21 (×3): 500 mg via INTRAVENOUS
  Filled 2018-06-20 (×5): qty 500

## 2018-06-20 MED ORDER — FAMOTIDINE 20 MG PO TABS
20.0000 mg | ORAL_TABLET | Freq: Every day | ORAL | Status: DC
  Filled 2018-06-20: qty 1

## 2018-06-20 NOTE — Progress Notes (Addendum)
Bladder scan for 439 cc.  Call out to doctor for orders.  Encouraged pt to void but she falls asleep easily. Henriette CombsSarah Castle Lamons RN Per Dr Sheryle Hailiamond, unless pt is uncomfortable, no new orders.  Pt is resting comfortably with no complaints at this time. Henriette CombsSarah Trine Fread RN

## 2018-06-20 NOTE — Plan of Care (Signed)
Telemetry just called and said QTC is getting longer now 617.  Dr. Amado CoeGouru ordered EKG.

## 2018-06-20 NOTE — Progress Notes (Signed)
Pharmacy Antibiotic Note  Cheryl [name] is a 67 y.o. female admitted on 06/18/2018 with cellulitis.  Pharmacy has been consulted for vancomycin and Zosyn dosing.  Plan: 7/27 1925 VT: 7. Level is subtherapeutic. Will adjust dose to  Vancomycin 500 mg q12 hrs. Goal trough 10 to 15 mcg/mL. Predicted trough at Css is 13.5. Will order trough prior to 4th dose.   Continue Zosyn 3.375 grams extended infusion q8hrs  Height: 4\' 10"  (147.3 cm) Weight: 104 lb (47.2 kg) IBW/kg (Calculated) : 40.9  Temp (24hrs), Avg:98.6 F (37 C), Min:98.4 F (36.9 C), Max:98.9 F (37.2 C)  Recent Labs  Lab 06/18/18 1145 06/18/18 1221 06/19/18 0532 06/20/18 1543 06/20/18 1925  WBC 6.6  --  7.5  --   --   CREATININE 0.74  --  0.73 0.63  --   LATICACIDVEN  --  1.0  --   --   --   VANCOTROUGH  --   --   --   --  7*    Estimated Creatinine Clearance: 42.9 mL/min (by C-G formula based on SCr of 0.63 mg/dL).    Allergies  Allergen Reactions  . Percocet [Oxycodone-Acetaminophen] Hives and Rash  . Aspirin Nausea And Vomiting and Other (See Comments)    Reaction:  GI upset   . Codeine Nausea And Vomiting, Other (See Comments) and Nausea Only    Reaction:  GI upset   . Propoxyphene Other (See Comments) and Nausea Only    GI upset Reaction:  GI upset   . Sulfa Antibiotics Rash and Other (See Comments)    Reaction:  GI upset     Antimicrobials this admission: Vanc 07/25 >>  Zosyn 07/25 >>   Dose adjustments this admission: 7/27 Vancomycin changed from 500 mg Q18 to Q12  Microbiology results: 07/25 BCx: NG x 2 days  07/25 UCx: NG Final    Thank you for allowing pharmacy to be a part of this patient's care.  Karmelo Bass M Marcelino Campos, Pharm.D, BCPS Clinical Pharmacist 06/20/2018 8:50 PM

## 2018-06-20 NOTE — Progress Notes (Signed)
[facility] - [place] at [place] [facility]   PATIENT NAME: Cheryl [name]    MR#:  [id number]  DATE OF BIRTH:  May 04, 1951  SUBJECTIVE:  CHIEF COMPLAINT: Patient is more awake and alert and answering questions appropriately.  Short of breath today and vomiting,on 5 L of oxygen  husband at bedside.  Seen by palliative care.  Decreased p.o. intake as patient did not like hospital food at all  REVIEW OF SYSTEMS:  CONSTITUTIONAL: No fever, fatigue or weakness.  EYES: No blurred or double vision.  EARS, NOSE, AND THROAT: No tinnitus or ear pain.  RESPIRATORY: No cough, has shortness of breath, wheezing or hemoptysis.  CARDIOVASCULAR: No chest pain, orthopnea, edema.  GASTROINTESTINAL: Has nausea, vomiting, denies diarrhea or abdominal pain.  GENITOURINARY: No dysuria, hematuria.  ENDOCRINE: No polyuria, nocturia,  HEMATOLOGY: No anemia, easy bruising or bleeding SKIN: Bilateral lower extremity  redness MUSCULOSKELETAL: No joint pain or arthritis.   NEUROLOGIC: No tingling, numbness, weakness.  PSYCHIATRY: No anxiety or depression.   DRUG ALLERGIES:   Allergies  Allergen Reactions  . Percocet [Oxycodone-Acetaminophen] Hives and Rash  . Aspirin Nausea And Vomiting and Other (See Comments)    Reaction:  GI upset   . Codeine Nausea And Vomiting, Other (See Comments) and Nausea Only    Reaction:  GI upset   . Propoxyphene Other (See Comments) and Nausea Only    GI upset Reaction:  GI upset   . Sulfa Antibiotics Rash and Other (See Comments)    Reaction:  GI upset     VITALS:  Blood pressure (!) 115/47, pulse 89, temperature 98.4 F (36.9 C), temperature source Oral, resp. rate (!) 21, height 4\' 10"  (1.473 m), weight 47.2 kg (104 lb), SpO2 98 %.  PHYSICAL EXAMINATION:  GENERAL:  67 y.o.-year-old patient lying in the bed with no acute distress.  EYES: Pupils equal, round, reactive to light and accommodation. No scleral icterus. Extraocular muscles intact.  HEENT:  Head atraumatic, normocephalic. Oropharynx and nasopharynx clear.  NECK:  Supple, no jugular venous distention. No thyroid enlargement, no tenderness.  LUNGS: Mod breath sounds bilaterally, no wheezing, rales,rhonchi or crepitation. No use of accessory muscles of respiration.  CARDIOVASCULAR: S1, S2 normal. No murmurs, rubs, or gallops.  ABDOMEN: Soft, nontender, nondistended. Bowel sounds present. No organomegaly or mass.  EXTREMITIES: Bilateral lower extremity erythema and warmth  At her baseline nEUROLOGIC: Cranial nerves II through XII are intact. Muscle strength at her baseline  in all extremities. Sensation intact. Gait not checked.  PSYCHIATRIC: The patient is alert and oriented x 3.  SKIN: No obvious rash, lesion, or ulcer.    LABORATORY PANEL:   CBC Recent Labs  Lab 06/19/18 0532  WBC 7.5  HGB 9.1*  HCT 27.3*  PLT 183   ------------------------------------------------------------------------------------------------------------------  Chemistries  Recent Labs  Lab 06/18/18 1145 06/19/18 0532  NA 138 141  K 3.7 3.4*  CL 100 105  CO2 27 28  GLUCOSE 86 76  BUN 17 11  CREATININE 0.74 0.73  CALCIUM 8.4* 7.4*  AST 21  --   ALT 23  --   ALKPHOS 178*  --   BILITOT 0.6  --    ------------------------------------------------------------------------------------------------------------------  Cardiac Enzymes Recent Labs  Lab 06/18/18 1145  TROPONINI 0.11*   ------------------------------------------------------------------------------------------------------------------  RADIOLOGY:  No results found.  EKG:   Orders placed or performed during the hospital encounter of 06/18/18  . ED EKG 12-Lead  . ED EKG 12-Lead  . EKG 12-Lead  . EKG  12-Lead    ASSESSMENT AND PLAN:   #Acute on chronic hypoxic respiratory failure.  With history of silent aspiration  Patient is on 5 L of oxygen lives on 2 to 3 L at home, Chest x-ray ordered Patient already on Zosyn and  vancomycin  #QT prolongation on telemetry we will get a twelve-lead EKG and check BMP and magnesium Pharmacy to titrate medications for QT prolongation  # .  Acute encephalopathy.    Resolved back to her baseline .she has had numerous presentations with this.  DCed  fentanyl patch at the time of admission and received a low-dose Narcan dose.    Will start the patient on fentanyl 12.5 mcg patch at this time to prevent narcotic withdrawal.  Patient takes 50 mcg at home which is discontinued  # .  Bilateral lower extremity cellulitis on chronic venous stasis.  ER physician started antibiotics, will continue for now to see if there will be some clinical progress  #.  GERD on PPI and H2 blocker.  #  History of COPD on PRN oxygen.  Continue nebulizer treatments  #  Weakness physical therapy evaluation-recommending skilled nursing facility but patient prefers going home with home health  #.  Relative hypotension hold antihypertensives.  Blood pressure is okay   # Failure to thrive with recurrent admissions-seen by palliative care, recommending outpatient palliative care follow-up.  Full code for now  All the records are reviewed and case discussed with Care Management/Social Workerr. Management plans discussed with the patient,  husband at bedside and they are in agreement.  CODE STATUS:  fc  TOTAL TIME TAKING CARE OF THIS PATIENT: 39 minutes.   POSSIBLE D/C IN 1-2 DAYS, DEPENDING ON CLINICAL CONDITION.  Note: This dictation was prepared with Dragon dictation along with smaller phrase technology. Any transcriptional errors that result from this process are unintentional.   Ramonita LabAruna Chandon Lazcano M.D on 06/20/2018 at 2:51 PM  Between 7am to 6pm - Pager - [phone] After 6pm go to [email or web address] - password [email or web address] [facility]  [facility]  Office  [phone]  CC: Primary care physician; Malva LimesFisher, Donald E, MD

## 2018-06-20 NOTE — Progress Notes (Signed)
Pharmacy Note  67 y/o F admitted with acute encephalopathy. Pharmacy consulted to review medications for drug interactions with particular attention to QTc prolongation. There are currently no significant interactions or QT prolonging medications with the exception of ondansetron. Will continue to monitor.   Luisa HartScott Ronnisha Felber, PharmD Clinical Pharmacist

## 2018-06-20 NOTE — Progress Notes (Addendum)
Twelve-lead EKG, BMP and magnesium ordered, notified charge nurse Cardiology consult placed Pharmacy consult placed to titrate the medication for QT prolongation

## 2018-06-21 ENCOUNTER — Other Ambulatory Visit: Payer: Self-pay

## 2018-06-21 DIAGNOSIS — F329 Major depressive disorder, single episode, unspecified: Secondary | ICD-10-CM

## 2018-06-21 DIAGNOSIS — F0631 Mood disorder due to known physiological condition with depressive features: Secondary | ICD-10-CM

## 2018-06-21 MED ORDER — METHYLPREDNISOLONE SODIUM SUCC 40 MG IJ SOLR
40.0000 mg | Freq: Two times a day (BID) | INTRAMUSCULAR | Status: DC
  Administered 2018-06-21 – 2018-06-22 (×2): 40 mg via INTRAVENOUS
  Filled 2018-06-21 (×2): qty 1

## 2018-06-21 MED ORDER — POTASSIUM CHLORIDE CRYS ER 20 MEQ PO TBCR: 40.0000 meq | EXTENDED_RELEASE_TABLET | Freq: Once | ORAL | Status: DC

## 2018-06-21 MED ORDER — METHYLPREDNISOLONE SODIUM SUCC 40 MG IJ SOLR: 40.0000 mg | Freq: Three times a day (TID) | INTRAMUSCULAR | Status: DC

## 2018-06-21 MED ORDER — ONDANSETRON HCL 4 MG/2ML IJ SOLN
4.0000 mg | INTRAMUSCULAR | Status: DC
  Administered 2018-06-21 – 2018-06-22 (×5): 4 mg via INTRAVENOUS
  Filled 2018-06-21 (×5): qty 2

## 2018-06-21 MED ORDER — POTASSIUM CHLORIDE 10 MEQ/100ML IV SOLN
10.0000 meq | INTRAVENOUS | Status: AC
  Administered 2018-06-21 (×4): 10 meq via INTRAVENOUS
  Filled 2018-06-21 (×4): qty 100

## 2018-06-21 MED ORDER — MIRTAZAPINE 15 MG PO TBDP
15.0000 mg | ORAL_TABLET | Freq: Every day | ORAL | Status: DC
  Administered 2018-06-21: 21:00:00 15 mg via ORAL
  Filled 2018-06-21 (×2): qty 1

## 2018-06-21 MED ORDER — PROCHLORPERAZINE EDISYLATE 10 MG/2ML IJ SOLN
5.0000 mg | INTRAMUSCULAR | Status: DC | PRN
  Filled 2018-06-21: qty 1

## 2018-06-21 NOTE — Progress Notes (Signed)
[facility] - [place] at [facility]   PATIENT NAME: Cheryl [name]    MR#:  [id number]  DATE OF BIRTH:  May 20, 1951  SUBJECTIVE:  CHIEF COMPLAINT: Patient is refusing to eat and take p.o. medications.  Denies any abdominal pain.  Feeling nauseous and not feeling hungry, did not like hospital food.  Asking to give everything including medications and food via IV   REVIEW OF SYSTEMS:  CONSTITUTIONAL: No fever, fatigue or weakness.  EYES: No blurred or double vision.  EARS, NOSE, AND THROAT: No tinnitus or ear pain.  RESPIRATORY: has cough, has shortness of breath, wheezing, denies hemoptysis.  CARDIOVASCULAR: No chest pain, orthopnea, edema.  GASTROINTESTINAL: Has nausea, vomiting, denies diarrhea or abdominal pain.  GENITOURINARY: No dysuria, hematuria.  ENDOCRINE: No polyuria, nocturia,  HEMATOLOGY: No anemia, easy bruising or bleeding SKIN: Bilateral lower extremity  redness MUSCULOSKELETAL: No joint pain or arthritis.   NEUROLOGIC: No tingling, numbness, weakness.  PSYCHIATRY: No anxiety or depression.   DRUG ALLERGIES:   Allergies  Allergen Reactions  . Percocet [Oxycodone-Acetaminophen] Hives and Rash  . Aspirin Nausea And Vomiting and Other (See Comments)    Reaction:  GI upset   . Codeine Nausea And Vomiting, Other (See Comments) and Nausea Only    Reaction:  GI upset   . Propoxyphene Other (See Comments) and Nausea Only    GI upset Reaction:  GI upset   . Sulfa Antibiotics Rash and Other (See Comments)    Reaction:  GI upset     VITALS:  Blood pressure (!) 146/73, pulse 78, temperature 97.9 F (36.6 C), temperature source Oral, resp. rate 20, height 4\' 10"  (1.473 m), weight 47.2 kg (104 lb), SpO2 99 %.  PHYSICAL EXAMINATION:  GENERAL:  67 y.o.-year-old patient lying in the bed with no acute distress.  EYES: Pupils equal, round, reactive to light and accommodation. No scleral icterus. Extraocular muscles intact.  HEENT: Head atraumatic,  normocephalic. Oropharynx and nasopharynx clear.  NECK:  Supple, no jugular venous distention. No thyroid enlargement, no tenderness.  LUNGS: Mod breath sounds bilaterally, no wheezing, rales,rhonchi or crepitation. No use of accessory muscles of respiration.  CARDIOVASCULAR: S1, S2 normal. No murmurs, rubs, or gallops.  ABDOMEN: Soft, nontender, nondistended. Bowel sounds present. No organomegaly or mass.  EXTREMITIES: Bilateral lower extremity erythema and warmth  At her baseline nEUROLOGIC: Cranial nerves II through XII are intact. Muscle strength at her baseline  in all extremities. Sensation intact. Gait not checked.  PSYCHIATRIC: The patient is alert and oriented x 3.  SKIN: No obvious rash, lesion, or ulcer.    LABORATORY PANEL:   CBC Recent Labs  Lab 06/19/18 0532  WBC 7.5  HGB 9.1*  HCT 27.3*  PLT 183   ------------------------------------------------------------------------------------------------------------------  Chemistries  Recent Labs  Lab 06/18/18 1145  06/20/18 1543  NA 138   < > 144  K 3.7   < > 3.3*  CL 100   < > 107  CO2 27   < > 28  GLUCOSE 86   < > 96  BUN 17   < > 6*  CREATININE 0.74   < > 0.63  CALCIUM 8.4*   < > 7.7*  MG  --   --  2.0  AST 21  --   --   ALT 23  --   --   ALKPHOS 178*  --   --   BILITOT 0.6  --   --    < > = values in  this interval not displayed.   ------------------------------------------------------------------------------------------------------------------  Cardiac Enzymes Recent Labs  Lab 06/18/18 1145  TROPONINI 0.11*   ------------------------------------------------------------------------------------------------------------------  RADIOLOGY:  Dg Chest 2 View  Result Date: 06/20/2018 CLINICAL DATA:  Shortness of breath EXAM: CHEST - 2 VIEW COMPARISON:  June 18, 2018 FINDINGS: Stable cardiomegaly. The hila and mediastinum are normal. No pneumothorax. Healed right posterior rib fracture. Increased interstitial  markings. Left basilar opacity. The lower thoracic spine is not well evaluated due to the left basilar opacity. IMPRESSION: 1. Left basilar infiltrate, new since June 15, 2018 and worsened since June 18, 2018. The findings are concerning for pneumonia or aspiration. 2. Cardiomegaly and pulmonary venous congestion. Electronically Signed   By: Gerome Samavid  Williams III M.D   On: 06/20/2018 16:16    EKG:   Orders placed or performed during the hospital encounter of 06/18/18  . ED EKG 12-Lead  . ED EKG 12-Lead  . EKG 12-Lead  . EKG 12-Lead  . EKG 12-Lead  . EKG 12-Lead    ASSESSMENT AND PLAN:   #Acute on chronic hypoxic respiratory failure.  Secondary to aspiration pneumonia, with history of silent aspiration  Patient is on 5 L of oxygen -weaned off  to 3 L ,lives on  3 L at home, Chest x-ray -basilar infiltrate Patient already on Zosyn and vancomycin  #Refusing to eat and take medications Possible depression psychiatry consult placed patient is agreeable No suicidal ideation  #QT prolongation on telemetry we will get a twelve-lead EKG and check BMP and magnesium Pharmacy to titrate medications for QT prolongation Cardiology consult is pending   #Hypokalemia potassium 3.3 replete.  Magnesium normal.  # .  Acute encephalopathy.    Resolved back to her baseline .she has had numerous presentations with this.  DCed  fentanyl patch at the time of admission and received a low-dose Narcan dose.    Will start the patient on fentanyl 12.5 mcg patch at this time to prevent narcotic withdrawal.  Patient takes 50 mcg at home which is discontinued  # .  Bilateral lower extremity cellulitis on chronic venous stasis.  ER physician started antibiotics, will continue for now to see if there will be some clinical progress  #.  GERD on PPI and H2 blocker.  #  History of COPD with bronchospasm-from underlying pneumonia .  Solu-Medrol added to the regimen Continue nebulizer treatments  #  Weakness physical  therapy evaluation-recommending skilled nursing facility but patient prefers going home with home health  #.  Relative hypotension hold antihypertensives.  Blood pressure is okay   # Failure to thrive with recurrent admissions-seen by palliative care, recommending outpatient palliative care follow-up.  Full code for now  All the records are reviewed and case discussed with Care Management/Social Workerr. Management plans discussed with the patient,  husband at bedside and they are in agreement.  CODE STATUS:  fc  TOTAL TIME TAKING CARE OF THIS PATIENT: 39 minutes.   POSSIBLE D/C IN 1-2 DAYS, DEPENDING ON CLINICAL CONDITION.  Note: This dictation was prepared with Dragon dictation along with smaller phrase technology. Any transcriptional errors that result from this process are unintentional.   Ramonita LabAruna Jedidiah Demartini M.D on 06/21/2018 at 1:20 PM  Between 7am to 6pm - Pager - [phone] After 6pm go to [email or web address] - password [email or web address] [facility]  [facility]  Office  [phone]  CC: Primary care physician; Malva LimesFisher, Donald E, MD

## 2018-06-21 NOTE — Progress Notes (Signed)
Patient continues with poor appetite. Scheduled nausea medication added this shift. Patient requiring immense encouragement for any activity. Patient refused all PO medications this shift.   [facility] Goals of care conversed with patient with no conclusion.   Will continue to encourage activity and input. Bo McclintockBrewer,Darothy Courtright S, RN

## 2018-06-21 NOTE — Progress Notes (Signed)
Initial Nutrition Assessment  DOCUMENTATION CODES:   Severe malnutrition in context of chronic illness  INTERVENTION:  Patient refused all interventions offered by RD including oral nutrition supplements and snacks.  If patient remains full scope care will need to consider placement of small-bore NGT for enteral nutrition by 8/1 if PO intake does not improve. Will continue to monitor.  Patient is not an appropriate candidate for TPN at this time.  NUTRITION DIAGNOSIS:   Severe Malnutrition related to chronic illness(COPD) as evidenced by severe fat depletion, moderate muscle depletion, severe muscle depletion.  GOAL:   Patient will meet greater than or equal to 90% of their needs  MONITOR:   PO intake, Labs, Weight trends, Skin, I & O's  REASON FOR ASSESSMENT:   Consult Diet education, Poor PO  ASSESSMENT:   67 year old female with PMHx of COPD, HTN, arthritis, HLD, GERD, neuropathy, CAD, anxiety, PVD, vitamin D deficiency, migraines, chronic pain, hx DVT, OP, asthma, kyphoscoliosis deformity of spine now admitted with acute on chronic hypoxic respiratory failure secondary to aspiration pneumonia with hx of silent aspiration, bilateral lower extremity cellulitis on chronic venous stasis.   Noted patient was seen by SLP but is refusing to follow SLP recommendations. She is adamant on having a regular diet. Now refusing PO intake today and requesting everything IV (medications and "food").   Met with patient at bedside. She reports she has had a poor appetite since admission. She does not want anything to eat today. She reports she was eating "okay" PTA but unable to provide much details on intake. She refuses all interventions recommended by RD including multiple oral nutrition supplements and snacks. Patient reports she does not like Ensure, Boost, or [employer]. She reports she just wants to rest today instead of eating.  Patient reports she is unsure of weight history. RD  obtained bed scale weight of 47.6 kg. Weight appears fairly stable per chart.  Medications reviewed and include: fentanyl patch, Solu-medrol 40 mg Q12hrs IV, Remeron 15 mg QHS, Zofrn 4 mg Q4hrs IV, NS @ 40 mL/hr, Zosyn, potassium chloride 10 mEq IV 4 times today, vancomycin.  Labs reviewed: On 7/27 Potassium 3.3, BUN 6.  Discussed with RN. Patient only had some orange ice cream today.  NUTRITION - FOCUSED PHYSICAL EXAM:    Most Recent Value  Orbital Region  Severe depletion  Upper Arm Region  Severe depletion  Thoracic and Lumbar Region  Moderate depletion  Buccal Region  Severe depletion  Temple Region  Moderate depletion  Clavicle Bone Region  Severe depletion  Clavicle and Acromion Bone Region  Severe depletion  Scapular Bone Region  Severe depletion  Dorsal Hand  Severe depletion  Patellar Region  Moderate depletion  Anterior Thigh Region  Moderate depletion  Posterior Calf Region  Moderate depletion  Edema (RD Assessment)  None  Hair  Reviewed  Eyes  Unable to assess  Mouth  Unable to assess  Skin  Reviewed  Nails  Reviewed     Diet Order:   Diet Order           Diet regular Room service appropriate? Yes; Fluid consistency: Thin  Diet effective now          EDUCATION NEEDS:   Not appropriate for education at this time  Skin:  Skin Assessment: Skin Integrity Issues:(cellulitis left leg; ecchymosis to bilateral arms)  Last BM:  06/21/2018 - large type 6  Height:   Ht Readings from Last 1 Encounters:  06/18/18 4'  10" (1.473 m)    Weight:   Wt Readings from Last 1 Encounters:  06/21/18 104 lb 15 oz (47.6 kg)    Ideal Body Weight:  43.9 kg  BMI:  Body mass index is 21.93 kg/m.  Estimated Nutritional Needs:   Kcal:  1200-1430 (25-30 kcal/kg)  Protein:  60-70 grams (1.3-1.5 grams/kg)  Fluid:  1.4-1.6 L/day (30-35 mL/kg)  Willey Blade, MS, RD, LDN Office: [phone] Pager: [phone] After Hours/Weekend Pager: [phone]

## 2018-06-21 NOTE — Plan of Care (Signed)
  Problem: Education: Goal: Knowledge of General Education information will improve Description Including pain rating scale, medication(s)/side effects and non-pharmacologic comfort measures Outcome: Progressing   Problem: Clinical Measurements: Goal: Will remain free from infection Outcome: Progressing Goal: Diagnostic test results will improve Outcome: Progressing Goal: Respiratory complications will improve Outcome: Progressing Goal: Cardiovascular complication will be avoided Outcome: Progressing   Problem: Coping: Goal: Level of anxiety will decrease Outcome: Progressing   Problem: Elimination: Goal: Will not experience complications related to bowel motility Outcome: Progressing Goal: Will not experience complications related to urinary retention Outcome: Progressing   Problem: Pain Managment: Goal: General experience of comfort will improve Outcome: Progressing   Problem: Safety: Goal: Ability to remain free from injury will improve Outcome: Progressing   Problem: Skin Integrity: Goal: Risk for impaired skin integrity will decrease Outcome: Progressing

## 2018-06-21 NOTE — Consult Note (Signed)
[facility] Consult   Reason for Consult:  Pt refused to eat or take medicines Referring Physician:  Dr. Margaretmary Eddy Patient Identification: Zelia Yzaguirre MRN:  [id number] Principal Diagnosis: depression due to physical illness Diagnosis:   Patient Active Problem List   Diagnosis Date Noted  . Goals of care, counseling/discussion [Z71.89]   . Palliative care by specialist [Z51.5]   . Acute encephalopathy [G93.40] 06/18/2018  . Iron deficiency anemia [D50.9] 06/09/2018  . Acute on chronic respiratory failure ([place]) [J96.20] 06/01/2018  . Gastritis without bleeding [K29.70]   . Hypoxia [R09.02] 05/14/2018  . Generalized weakness [R53.1] 02/08/2018  . Rib fracture [S22.39XA] 09/19/2017  . Marijuana use [F12.90] 06/05/2017  . Pneumonia [J18.9] 04/29/2017  . Acute postoperative pain [G89.18] 04/14/2017  . Metabolic encephalopathy [Y10.17] 12/23/2016  . Hypokalemia [E87.6] 12/23/2016  . Protein-calorie malnutrition, severe [E43] 11/06/2016  . Nausea with vomiting [R11.2]   . Lumbar facet syndrome (Location of Primary Source of Pain) (Bilateral) (L>R) [P10.258] 03/20/2016  . Lumbar spondylosis [M47.816] 03/20/2016  . Opiate use (160 MME/Day) [F11.90] 02/28/2016  . Anemia [D64.9] 02/28/2016  . Osteoarthrosis [M19.90] 12/18/2015  . Long term current use of anticoagulant therapy (Plavix) [Z79.01] 12/18/2015  . Long term current use of opiate analgesic [Z79.891] 12/04/2015  . Fibromyalgia [M79.7] 12/04/2015  . Acute on chronic respiratory failure with hypoxia ([place]) [J96.21] 11/24/2015  . Dysphagia [R13.10] 11/24/2015  . Acute diastolic CHF (congestive heart failure) ([place]) [I50.31] 11/24/2015  . Leukocytosis [D72.829] 11/24/2015  . Abnormal mammogram of right breast [R92.8] 10/11/2015  . Dilated intrahepatic bile duct [K83.8] 10/11/2015  . Lumbar radicular pain (Bilateral) (L>R) (L4) [M54.16] 09/04/2015  . Chronic low back pain (Location of Primary Source of Pain) (Bilateral)  (L>R) [M54.42, M54.41, G89.29] 09/04/2015  . Encounter for therapeutic drug level monitoring [Z51.81] 09/04/2015  . Uncomplicated opioid dependence ([place]) [F11.20] 09/04/2015  . Chronic pain syndrome [G89.4] 09/04/2015  . Platelet inhibition due to Plavix [Z79.02] 09/04/2015  . COPD with acute exacerbation ([place]) [J44.1] 07/31/2015  . Dementia [F03.90] 07/31/2015  . Airway hyperreactivity [J45.909] 07/03/2015  . Back pain, thoracic [M54.6] 07/03/2015  . Excessive falling [R29.6] 07/03/2015  . Alteration in bowel elimination: incontinence [R15.9] 07/03/2015  . Insomnia [G47.00] 07/03/2015  . Decreased testosterone level [R79.89] 07/03/2015  . Leg weakness [R29.898] 07/03/2015  . Menopausal symptom [N95.1] 07/03/2015  . Migraine [G43.909] 07/03/2015  . Neuropathy ([place]) [G62.9] 07/03/2015  . Fecal occult blood test positive [R19.5] 07/03/2015  . OP (osteoporosis) [M81.0] 07/03/2015  . Panic disorder [F41.0] 07/03/2015  . Compulsive tobacco user syndrome [F17.200] 07/03/2015  . Urinary incontinence [R32] 07/03/2015  . Weight loss [R63.4] 07/03/2015  . Essential hypertension [I10] 06/06/2015  . GERD (gastroesophageal reflux disease) [K21.9] 06/06/2015  . Hyperlipemia [E78.5] 06/06/2015  . 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 Anxiety [F41.9] 02/10/2014  . Coronary artery disease [I25.10] 02/10/2014  . Peripheral vascular disease ([place]) [I73.9] 02/10/2014  . Vitamin D deficiency [E55.9] 10/16/2009    Total Time spent with patient: 1 hour  Subjective:   Hala Narula is a 67 y.o. female patient with prior mental illness other than cannabis use, but with mutliple medical medical conditions and recent frequent hospitializations, admitted on 7/25 for AMS. Psych is consulted because pt refused to eat or take oral medications.   HPI:  Leith Hedlund is a 67 y.o. female patient with prior mental illness other than cannabis use, but with mutliple medical medical conditions and  recent frequent hospitializations, admitted on 7/25 for  AMS. Psych is consulted because pt refused to eat or take oral medications.  Pt is seen in the room with her husband at bedside.  She is wake, alert, but feeling very tired. She said that she just doesn't want to upset her stomach with more pills. She has no appetite with food at this time even though she understands that she needs to eat some thing.  Husband said that she likes icecream, and did have some today.  Pt denied feeling depressed, other than just feeling sick, or weak.  Denied feeing of hopeless and NO SI.   Husband said that pt has had 3 hospitalizations in the past 7 weeks. everytime she is in the hospital, she seems to lose her appetite, but she has been eating very well at home.  She does have some insomnia once in a while, for which she takes one xanax 0.31m at night time.     Discussed the medication option with pt and husband and agreed to try remeron to help with appetite and sleep and can enhance her mood as well   Past Psychiatric History:  Denied.   Risk to Self:  low Risk to Others:   Prior Inpatient Therapy:   Prior Outpatient Therapy:    Past Medical History:  Past Medical History:  Diagnosis Date  . Allergy   . Anxiety   . Arthritis   . [place]iration pneumonitis ([place] 11/24/2015  . Asthma   . Chronic pain   . COPD (chronic obstructive pulmonary disease) ([place]   . Coronary artery disease   . DVT (deep venous thrombosis) ([place]   . GERD (gastroesophageal reflux disease)   . Headache   . Hyperlipidemia   . Hypertension   . Kyphoscoliosis deformity of spine   . Migraines   . Neuropathy 2010  . Osteoporosis   . Oxygen deficiency   . Peripheral vascular disease ([place]   . Pneumonia   . Pneumonia 11/19/2015  . Pneumonia 10/2016  . Pneumonia    aspiration 2018- 4 times in last year  . Vitamin D deficiency     Past Surgical History:  Procedure Laterality Date  . ABDOMINAL HYSTERECTOMY  1975   Bilaterl  Oophorectomy; [place] to IUD infection  . abdomnal aortic stent  05/30/2008   Dr. JQuay Burow . APPENDECTOMY    . APPENDECTOMY    . cardiac catherization  10/31/2009  . CERVICAL FUSION  C5 - 6/C6-7  . CHOLECYSTECTOMY  1972  . COLONOSCOPY WITH PROPOFOL N/A 07/27/2015   Procedure: COLONOSCOPY WITH PROPOFOL;  Surgeon: PHulen Luster MD;  [facility];  Service: Gastroenterology;  Laterality: N/A;  . ESOPHAGOGASTRODUODENOSCOPY (EGD) WITH PROPOFOL N/A 07/27/2015   Procedure: ESOPHAGOGASTRODUODENOSCOPY (EGD) WITH PROPOFOL;  Surgeon: PHulen Luster MD;  [facility];  Service: Gastroenterology;  Laterality: N/A;  . ESOPHAGOGASTRODUODENOSCOPY (EGD) WITH PROPOFOL N/A 05/19/2018   Procedure: ESOPHAGOGASTRODUODENOSCOPY (EGD) WITH PROPOFOL;  Surgeon: WLucilla Lame MD;  [facility];  Service: Endoscopy;  Laterality: N/A;  . FOOT SURGERY Bilateral    5-6 years per patient  . SPINE SURGERY     Family History:  Family History  Problem Relation Age of Onset  . Cancer Mother   . Arthritis Mother   . Heart disease Mother   . Diabetes Mother        mellitus, type 2  . Heart disease Father   . Diabetes Sister   . Cancer Brother   . Cancer Brother  lung  . Diabetes Brother    Family Psychiatric  History: unknown  Social History:  Social History   Substance and Sexual Activity  Alcohol Use No  . Alcohol/week: 0.0 oz     Social History   Substance and Sexual Activity  Drug Use No    Social History   Socioeconomic History  . Marital status: Married    Spouse name: Not on file  . Number of children: 6  . Years of education: Not on file  . Highest education level: Not on file  Occupational History  . Occupation: Disabled  Social Needs  . Financial resource strain: Not on file  . Food insecurity:    Worry: Not on file    Inability: Not on file  . Transportation needs:    Medical: Not on file    Non-medical: Not on file  Tobacco Use  . Smoking status:  Current Every Day Smoker    Packs/day: 1.00    Years: 50.00    Pack years: 50.00    Types: Cigarettes  . Smokeless tobacco: Never Used  . Tobacco comment: Previously smoked 2 ppd  Substance and Sexual Activity  . Alcohol use: No    Alcohol/week: 0.0 oz  . Drug use: No  . Sexual activity: Never  Lifestyle  . Physical activity:    Days per week: Not on file    Minutes per session: Not on file  . Stress: Not on file  Relationships  . Social connections:    Talks on phone: Not on file    Gets together: Not on file    Attends religious service: Not on file    Active member of club or organization: Not on file    Attends meetings of clubs or organizations: Not on file    Relationship status: Not on file  Other Topics Concern  . Not on file  Social History Narrative  . Not on file   Additional Social History:    Allergies:   Allergies  Allergen Reactions  . Percocet [Oxycodone-Acetaminophen] Hives and Rash  . Aspirin Nausea And Vomiting and Other (See Comments)    Reaction:  GI upset   . Codeine Nausea And Vomiting, Other (See Comments) and Nausea Only    Reaction:  GI upset   . Propoxyphene Other (See Comments) and Nausea Only    GI upset Reaction:  GI upset   . Sulfa Antibiotics Rash and Other (See Comments)    Reaction:  GI upset     Labs:  Results for orders placed or performed during the hospital encounter of 06/18/18 (from the past 48 hour(s))  Basic metabolic panel     Status: Abnormal   Collection Time: 06/20/18  3:43 PM  Result Value Ref Range   Sodium 144 135 - 145 mmol/L   Potassium 3.3 (L) 3.5 - 5.1 mmol/L   Chloride 107 98 - 111 mmol/L   CO2 28 22 - 32 mmol/L   Glucose, Bld 96 70 - 99 mg/dL   BUN 6 (L) 8 - 23 mg/dL   Creatinine, Ser 0.63 0.44 - 1.00 mg/dL   Calcium 7.7 (L) 8.9 - 10.3 mg/dL   GFR calc non Af Amer >60 >60 mL/min   GFR calc Af Amer >60 >60 mL/min    Comment: (NOTE) The eGFR has been calculated using the CKD EPI equation. This  calculation has not been validated in all clinical situations. eGFR's persistently <60 mL/min signify possible Chronic Kidney Disease.  Anion gap 9 5 - 15    Comment: Performed at [facility], [address] [place], [place] [address]  Magnesium     Status: None   Collection Time: 06/20/18  3:43 PM  Result Value Ref Range   Magnesium 2.0 1.7 - 2.4 mg/dL    Comment: Performed at [facility], [address] [place], [place] [address]  Vancomycin, trough     Status: Abnormal   Collection Time: 06/20/18  7:25 PM  Result Value Ref Range   Vancomycin Tr 7 (L) 15 - 20 ug/mL    Comment: Performed at [facility], [address] [place], [place] [address]    Current Facility-Administered Medications  Medication Dose Route Frequency Provider Last Rate Last Dose  . 0.9 %  sodium chloride infusion   Intravenous Continuous Loletha Grayer, MD 40 mL/hr at 06/21/18 0600    . acetaminophen (TYLENOL) suppository 650 mg  650 mg Rectal Q8H PRN Loletha Grayer, MD   650 mg at 06/18/18 1841  . acetaminophen (TYLENOL) tablet 650 mg  650 mg Oral Q6H PRN Loletha Grayer, MD      . albuterol (PROVENTIL) (2.5 MG/3ML) 0.083% nebulizer solution 2.5 mg  2.5 mg Nebulization Q4H PRN Loletha Grayer, MD   2.5 mg at 06/19/18 0239  . amLODipine (NORVASC) tablet 5 mg  5 mg Oral Daily Loletha Grayer, MD   5 mg at 06/20/18 1101  . clopidogrel (PLAVIX) tablet 75 mg  75 mg Oral Daily Gouru, Aruna, MD   75 mg at 06/20/18 1101  . enoxaparin (LOVENOX) injection 40 mg  40 mg Subcutaneous Q24H Gouru, Aruna, MD   40 mg at 06/20/18 2204  . fentaNYL (DURAGESIC - dosed mcg/hr) 12.5 mcg  12.5 mcg Transdermal Q72H Gouru, Aruna, MD   12.5 mcg at 06/20/18 0419  . fluticasone (FLONASE) 50 MCG/ACT nasal spray 2 spray  2 spray Each Nare Daily Loletha Grayer, MD   2 spray at 06/20/18 1105  . ketorolac (TORADOL) 30 MG/ML injection 15 mg  15 mg Intravenous Q6H PRN Loletha Grayer, MD   15 mg at  06/18/18 2043  . MEDLINE mouth rinse  15 mL Mouth Rinse BID Loletha Grayer, MD   15 mL at 06/20/18 1108  . methylPREDNISolone sodium succinate (SOLU-MEDROL) 40 mg/mL injection 40 mg  40 mg Intravenous Q12H Gouru, Aruna, MD      . mirtazapine (REMERON SOL-TAB) disintegrating tablet 15 mg  15 mg Oral QHS Cathy Crounse, MD      . mometasone-formoterol (DULERA) 200-5 MCG/ACT inhaler 2 puff  2 puff Inhalation BID Gouru, Aruna, MD   2 puff at 06/20/18 2201  . naloxone [facility]) injection 0.4 mg  0.4 mg Intravenous Q4H PRN Wieting, Richard, MD      . ondansetron [facility]) injection 4 mg  4 mg Intravenous Q4H Gouru, Aruna, MD   4 mg at 06/21/18 1322  . piperacillin-tazobactam (ZOSYN) IVPB 3.375 g  3.375 g Intravenous Q8H Wieting, Richard, MD 12.5 mL/hr at 06/21/18 1323 3.375 g at 06/21/18 1323  . potassium chloride 10 mEq in 100 mL IVPB  10 mEq Intravenous Q1 Hr x 4 Hallaji, Sheema M, RPH      . tiotropium (SPIRIVA) inhalation capsule 18 mcg  18 mcg Inhalation Daily Gouru, Aruna, MD   18 mcg at 06/20/18 1102  . vancomycin (VANCOCIN) 500 mg in sodium chloride 0.9 % 100 mL IVPB  500 mg Intravenous Q12H Pernell Dupre, RPH   Stopped at 06/21/18 1020  Musculoskeletal: Strength & Muscle Tone: within normal limits Gait & Station: unsteady Patient leans: N/A  Psychiatric Specialty Exam: Physical Exam  ROS  Blood pressure (!) 146/73, pulse 78, temperature 97.9 F (36.6 C), temperature source Oral, resp. rate 20, height _0  (1.473 m), weight 47.2 kg (104 lb), SpO2 99 %.Body mass index is 21.74 kg/m.  General Appearance: Fairly Groomed  Eye Contact:  Poor  Speech:  Clear and Coherent and Slow  Volume:  Decreased  Mood:  Depressed  Affect:  Congruent, Constricted and Depressed  Thought Process:  Goal Directed and Linear  Orientation:  Full (Time, Place, and Person)  Thought Content:  Logical  Suicidal Thoughts:  No  Homicidal Thoughts:  No  Memory:  Immediate;   Fair Recent;   Fair Remote;   Fair   Judgement:  Fair  Insight:  Fair  Psychomotor Activity:  Decreased  Concentration:  Concentration: Poor and Attention Span: Poor  Recall:  [employer] of Knowledge:  Fair  Language:  Fair  Akathisia:  No  Handed:  irrelevant   AIMS (if indicated):     Assets:  Social Support  ADL's:  Impaired  Cognition:  Impaired,  Mild  Sleep:        Treatment Plan Summary: Daily contact with patient to assess and evaluate symptoms and progress in treatment and Medication management   Depression due to physical illness -- stop cymbalta -- start Remeron 61m qhs for sleep, appetite and potential depression.   -- psych will follow as needed.   Disposition: No evidence of imminent risk to self or others at present.    Wilmary Levit, MD 06/21/2018 3:12 PM

## 2018-06-21 NOTE — Consult Note (Signed)
Cardiology Consultation Note    Patient ID: Cheryl [name], MRN: [id number], DOB/AGE: June 21, 1951 67 y.o. Admit date: 06/18/2018   Date of Consult: 06/21/2018 Primary Physician: Birdie Sons, MD Primary Cardiologist:    Chief Complaint: sob Reason for Consultation: long qt Requesting MD: Dr. Margaretmary Eddy  HPI: Cheryl [name] is a 67 y.o. female with history of chronic pain, htn, cad and prolonged qt  who was admitted with altered mental status and sob.. She has had several admissions in the past with altered mental status and confusion. She has had episodic of nauea and vomiting. She has had several falls recently. No evidence of syncope or dysrhythmia. EKG showed prolonged qt of 521. Review of previous ekgs show similar prolonged qt. She is currently on several drugs either scheduled or prn that potentially may cause prolongation of the qt. These include zosyn, pantoprazole, zofran. Currently stable  Past Medical History:  Diagnosis Date  . Allergy   . Anxiety   . Arthritis   . Aspiration pneumonitis ([place]) 11/24/2015  . Asthma   . Chronic pain   . COPD (chronic obstructive pulmonary disease) ([place])   . Coronary artery disease   . DVT (deep venous thrombosis) ([place])   . GERD (gastroesophageal reflux disease)   . Headache   . Hyperlipidemia   . Hypertension   . Kyphoscoliosis deformity of spine   . Migraines   . Neuropathy 2010  . Osteoporosis   . Oxygen deficiency   . Peripheral vascular disease ([place])   . Pneumonia   . Pneumonia 11/19/2015  . Pneumonia 10/2016  . Pneumonia    aspiration 2018- 4 times in last year  . Vitamin D deficiency       Surgical History:  Past Surgical History:  Procedure Laterality Date  . ABDOMINAL HYSTERECTOMY  1975   Bilaterl Oophorectomy; [place] to IUD infection  . abdomnal aortic stent  05/30/2008   Dr. Quay Burow  . APPENDECTOMY    . APPENDECTOMY    . cardiac catherization  10/31/2009  . CERVICAL FUSION  C5 - 6/C6-7  .  CHOLECYSTECTOMY  1972  . COLONOSCOPY WITH PROPOFOL N/A 07/27/2015   Procedure: COLONOSCOPY WITH PROPOFOL;  Surgeon: Hulen Luster, MD;  Location: [facility] ENDOSCOPY;  Service: Gastroenterology;  Laterality: N/A;  . ESOPHAGOGASTRODUODENOSCOPY (EGD) WITH PROPOFOL N/A 07/27/2015   Procedure: ESOPHAGOGASTRODUODENOSCOPY (EGD) WITH PROPOFOL;  Surgeon: Hulen Luster, MD;  Location: [facility] ENDOSCOPY;  Service: Gastroenterology;  Laterality: N/A;  . ESOPHAGOGASTRODUODENOSCOPY (EGD) WITH PROPOFOL N/A 05/19/2018   Procedure: ESOPHAGOGASTRODUODENOSCOPY (EGD) WITH PROPOFOL;  Surgeon: Lucilla Lame, MD;  Location: [facility] ENDOSCOPY;  Service: Endoscopy;  Laterality: N/A;  . FOOT SURGERY Bilateral    5-6 years per patient  . SPINE SURGERY       Home Meds: Prior to Admission medications   Medication Sig Start Date End Date Taking? Authorizing Provider  alendronate (FOSAMAX) 70 MG tablet Take 1 tablet (70 mg total) by mouth every 7 (seven) days. Take with a full glass of water on an empty stomach. 01/23/18  Yes Birdie Sons, MD  ALPRAZolam Duanne Moron) 0.5 MG tablet Take 0.5 tablets (0.25 mg total) by mouth at bedtime as needed for anxiety. Patient taking differently: Take 0.25-0.5 mg by mouth at bedtime as needed for anxiety.  05/25/18  Yes Birdie Sons, MD  amLODipine (NORVASC) 5 MG tablet Take 1 tablet (5 mg total) by mouth daily. 05/22/18  Yes Wieting, Richard, MD  cephALEXin (KEFLEX) 500 MG capsule Take 1 capsule (  500 mg total) by mouth 4 (four) times daily for 10 days. 06/15/18 06/25/18 Yes Birdie Sons, MD  clopidogrel (PLAVIX) 75 MG tablet Take 75 mg by mouth daily.   Yes [provider]  diphenoxylate-atropine (LOMOTIL) 2.5-0.025 MG tablet Take 1 tablet by mouth as needed for diarrhea or loose stools. 06/10/18  Yes [provider]  donepezil (ARICEPT) 10 MG tablet Take 10 mg by mouth at bedtime.   Yes [provider]  DULoxetine (CYMBALTA) 30 MG capsule Take 1 capsule by mouth daily. 01/26/18  Yes  [provider]  estradiol (ESTRACE) 0.5 MG tablet Take 1 tablet (0.5 mg total) by mouth daily. (Take in place of estropipate) Patient taking differently: Take 0.5 mg by mouth every other day. (Take in place of estropipate) 02/26/18  Yes Fisher, Kirstie Peri, MD  famotidine (PEPCID) 20 MG tablet Take 1 tablet (20 mg total) by mouth daily. 05/21/18 05/21/19 Yes Wieting, Richard, MD  fentaNYL (DURAGESIC - DOSED MCG/HR) 50 MCG/HR Place 1 patch (50 mcg total) onto the skin every 3 (three) days. 07/21/18 08/20/18 Yes Vevelyn Francois, NP  ferrous sulfate 325 (65 FE) MG tablet Take 1 tablet (325 mg total) by mouth 2 (two) times daily with a meal. 06/05/18  Yes Dustin Flock, MD  Fluticasone-Salmeterol (ADVAIR DISKUS) 250-50 MCG/DOSE AEPB inhale 1 dose by mouth twice a day 01/23/18  Yes Birdie Sons, MD  furosemide (LASIX) 40 MG tablet Take 1 tablet by mouth daily. 06/16/18  Yes [provider]  ondansetron (ZOFRAN) 4 MG tablet Take 0.5-1 tablets (2-4 mg total) by mouth every 6 (six) hours as needed for nausea. Patient taking differently: Take 4 mg by mouth daily.  05/25/18  Yes Birdie Sons, MD  pantoprazole (PROTONIX) 40 MG tablet Take 1 tablet (40 mg total) by mouth 2 (two) times daily. Patient taking differently: Take 40 mg by mouth daily.  01/23/18  Yes Birdie Sons, MD  pregabalin (LYRICA) 150 MG capsule Take 150 mg by mouth 2 (two) times daily.   Yes [provider]  simvastatin (ZOCOR) 10 MG tablet Take 10 mg by mouth at bedtime.    Yes [provider]  tiotropium (SPIRIVA HANDIHALER) 18 MCG inhalation capsule inhale the contents of one capsule in the handihaler once daily 01/23/18  Yes Fisher, Kirstie Peri, MD  traZODone (DESYREL) 50 MG tablet Take 1-2 tablets (50-100 mg total) by mouth at bedtime. 05/25/18  Yes Birdie Sons, MD  vitamin B-12 (CYANOCOBALAMIN) 1000 MCG tablet Take 1 tablet (1,000 mcg total) by mouth daily. 05/21/18  Yes Wieting, Richard, MD  acetaminophen  (TYLENOL) 325 MG tablet Take 2 tablets (650 mg total) by mouth every 6 (six) hours as needed for mild pain (or Fever >/= 101). 05/21/18   Loletha Grayer, MD  albuterol (PROVENTIL) (2.5 MG/3ML) 0.083% nebulizer solution Take 3 mLs (2.5 mg total) by nebulization every 4 (four) hours as needed for wheezing. 12/24/17   Birdie Sons, MD  celecoxib (CELEBREX) 100 MG capsule Take 1 capsule by mouth as needed for pain. 06/10/18   [provider]  NARCAN 4 MG/0.1ML LIQD nasal spray kit Place 0.4 mg into the nose once.  09/11/17   [provider]  ondansetron (ZOFRAN-ODT) 4 MG disintegrating tablet Take 1 tablet by mouth every 8 (eight) hours as needed. 06/09/18   [provider]  Suvorexant (BELSOMRA) 5 MG TABS Take 1-2 tablets by mouth at bedtime as needed (insomnia). 06/17/18   Birdie Sons,  MD  triamcinolone (KENALOG) 0.025 % ointment Apply 1 application topically 2 (two) times daily. 06/15/18   Birdie Sons, MD    Inpatient Medications:  . amLODipine  5 mg Oral Daily  . clopidogrel  75 mg Oral Daily  . DULoxetine  30 mg Oral Daily  . enoxaparin (LOVENOX) injection  40 mg Subcutaneous Q24H  . fentaNYL  12.5 mcg Transdermal Q72H  . fluticasone  2 spray Each Nare Daily  . mouth rinse  15 mL Mouth Rinse BID  . methylPREDNISolone (SOLU-MEDROL) injection  40 mg Intravenous Q12H  . mometasone-formoterol  2 puff Inhalation BID  . ondansetron (ZOFRAN) IV  4 mg Intravenous Q4H  . tiotropium  18 mcg Inhalation Daily   . sodium chloride 40 mL/hr at 06/21/18 0600  . piperacillin-tazobactam (ZOSYN)  IV 3.375 g (06/21/18 1323)  . potassium chloride    . vancomycin Stopped (06/21/18 1020)    Allergies:  Allergies  Allergen Reactions  . Percocet [Oxycodone-Acetaminophen] Hives and Rash  . Aspirin Nausea And Vomiting and Other (See Comments)    Reaction:  GI upset   . Codeine Nausea And Vomiting, Other (See Comments) and Nausea Only    Reaction:  GI upset   .  Propoxyphene Other (See Comments) and Nausea Only    GI upset Reaction:  GI upset   . Sulfa Antibiotics Rash and Other (See Comments)    Reaction:  GI upset     Social History   Socioeconomic History  . Marital status: Married    Spouse name: Not on file  . Number of children: 6  . Years of education: Not on file  . Highest education level: Not on file  Occupational History  . Occupation: Disabled  Social Needs  . Financial resource strain: Not on file  . Food insecurity:    Worry: Not on file    Inability: Not on file  . Transportation needs:    Medical: Not on file    Non-medical: Not on file  Tobacco Use  . Smoking status: Current Every Day Smoker    Packs/day: 1.00    Years: 50.00    Pack years: 50.00    Types: Cigarettes  . Smokeless tobacco: Never Used  . Tobacco comment: Previously smoked 2 ppd  Substance and Sexual Activity  . Alcohol use: No    Alcohol/week: 0.0 oz  . Drug use: No  . Sexual activity: Never  Lifestyle  . Physical activity:    Days per week: Not on file    Minutes per session: Not on file  . Stress: Not on file  Relationships  . Social connections:    Talks on phone: Not on file    Gets together: Not on file    Attends religious service: Not on file    Active member of club or organization: Not on file    Attends meetings of clubs or organizations: Not on file    Relationship status: Not on file  . Intimate partner violence:    Fear of current or ex partner: Not on file    Emotionally abused: Not on file    Physically abused: Not on file    Forced sexual activity: Not on file  Other Topics Concern  . Not on file  Social History Narrative  . Not on file     Family History  Problem Relation Age of Onset  . Cancer Mother   . Arthritis Mother   . Heart disease Mother   .  Diabetes Mother        mellitus, type 2  . Heart disease Father   . Diabetes Sister   . Cancer Brother   . Cancer Brother        lung  . Diabetes Brother       Review of Systems: A 12-system review of systems was performed and is negative except as noted in the HPI.  Labs: No results for input(s): CKTOTAL, CKMB, TROPONINI in the last 72 hours. Lab Results  Component Value Date   WBC 7.5 06/19/2018   HGB 9.1 (L) 06/19/2018   HCT 27.3 (L) 06/19/2018   MCV 88.7 06/19/2018   PLT 183 06/19/2018    Recent Labs  Lab 06/18/18 1145  06/20/18 1543  NA 138   < > 144  K 3.7   < > 3.3*  CL 100   < > 107  CO2 27   < > 28  BUN 17   < > 6*  CREATININE 0.74   < > 0.63  CALCIUM 8.4*   < > 7.7*  PROT 6.8  --   --   BILITOT 0.6  --   --   ALKPHOS 178*  --   --   ALT 23  --   --   AST 21  --   --   GLUCOSE 86   < > 96   < > = values in this interval not displayed.   Lab Results  Component Value Date   CHOL 138 01/17/2016   HDL 68 01/17/2016   LDLCALC 48 01/17/2016   TRIG 108 01/17/2016   Lab Results  Component Value Date   DDIMER  10/30/2009    0.32        AT THE INHOUSE ESTABLISHED CUTOFF VALUE OF 0.48 ug/mL FEU, THIS ASSAY HAS BEEN DOCUMENTED IN THE LITERATURE TO HAVE A SENSITIVITY AND NEGATIVE PREDICTIVE VALUE OF AT LEAST 98 TO 99%.  THE TEST RESULT SHOULD BE CORRELATED WITH AN ASSESSMENT OF THE CLINICAL PROBABILITY OF DVT / VTE.    Radiology/Studies:  Dg Chest 2 View  Result Date: 06/20/2018 CLINICAL DATA:  Shortness of breath EXAM: CHEST - 2 VIEW COMPARISON:  June 18, 2018 FINDINGS: Stable cardiomegaly. The hila and mediastinum are normal. No pneumothorax. Healed right posterior rib fracture. Increased interstitial markings. Left basilar opacity. The lower thoracic spine is not well evaluated due to the left basilar opacity. IMPRESSION: 1. Left basilar infiltrate, new since June 15, 2018 and worsened since June 18, 2018. The findings are concerning for pneumonia or aspiration. 2. Cardiomegaly and pulmonary venous congestion. Electronically Signed   By: Dorise Bullion III M.D   On: 06/20/2018 16:16   Dg Chest 2  View  Result Date: 06/15/2018 CLINICAL DATA:  Follow-up for recent pneumonia. Still has cough and shortness of breath. EXAM: CHEST - 2 VIEW COMPARISON:  CT chest 06/01/2018.  Chest 06/01/2018. FINDINGS: Examination is limited due to difficulties with patient positioning. Patient rotation. Shallow inspiration. Heart size and pulmonary vascularity lobe normal. No developing consolidation or airspace disease demonstrated in the lungs. No blunting of costophrenic angles. No pneumothorax. Degenerative changes in the spine. IMPRESSION: Shallow inspiration. No evidence of active pulmonary disease. Examination is limited due to patient rotation and positioning. Electronically Signed   By: Lucienne Capers M.D.   On: 06/15/2018 22:44   Dg Chest 2 View  Result Date: 06/01/2018 CLINICAL DATA:  Fever and productive cough. History of asthma/COPD, CAD and hypertension. EXAM: CHEST - 2  VIEW COMPARISON:  05/25/2018 and 05/16/2018 radiographs. FINDINGS: Persistent patient rotation to the right. The heart size and mediastinal contours are stable. There is overall improved aeration of the right middle lobe and lingula. Chronic interstitial prominence is otherwise unchanged. There is no edema, confluent airspace opacity, pleural effusion or pneumothorax. Abdominal aortic stent graft is noted. No acute osseous findings are seen. IMPRESSION: Overall improved aeration of both lung bases with otherwise stable generalized interstitial prominence. No acute cardiopulmonary process. Electronically Signed   By: Richardean Sale M.D.   On: 06/01/2018 12:19   Dg Chest 2 View  Result Date: 05/26/2018 CLINICAL DATA:  Cough EXAM: CHEST - 2 VIEW COMPARISON:  05/16/2018 FINDINGS: Cardiac shadow is mildly enlarged. The lungs are well aerated bilaterally with mild interstitial changes stable from the prior exam. No focal infiltrate is seen. Some scarring in the lingula and right middle lobe is noted. No acute bony abnormality is noted.  IMPRESSION: Scarring without acute abnormality. Electronically Signed   By: Inez Catalina M.D.   On: 05/26/2018 08:55   Ct Head Wo Contrast  Result Date: 06/18/2018 CLINICAL DATA:  Multiple falls over the past few days. Change in mental status. EXAM: CT HEAD WITHOUT CONTRAST TECHNIQUE: Contiguous axial images were obtained from the base of the skull through the vertex without intravenous contrast. COMPARISON:  Brain MRI, 05/18/2018.  Head CT, 09/12/2017. FINDINGS: Brain: No evidence of acute infarction, hemorrhage, hydrocephalus, extra-axial collection or mass lesion/mass effect. Patchy areas of white matter hypoattenuation are noted consistent with mild to moderate chronic microvascular ischemic change. Vascular: No hyperdense vessel or unexpected calcification. Skull: Normal. Negative for fracture or focal lesion. Sinuses/Orbits: Visualized globes and orbits are unremarkable. Mucosal thickening opacifies many of the ethmoid air cells. Mild mucosal thickening in left frontal and sphenoid sinuses. Clear mastoid air cells. Other: None. IMPRESSION: 1. No acute intracranial abnormalities. 2. Mild to moderate chronic microvascular ischemic change. 3. Sinus mucosal thickening mostly of the ethmoid air cells. Electronically Signed   By: Lajean Manes M.D.   On: 06/18/2018 12:59   Ct Chest W Contrast  Result Date: 06/01/2018 CLINICAL DATA:  67 year old presenting with acute onset of fever up to 103 degrees Fahrenheit associated with a productive cough. Patient recently discharged from the hospital with a diagnosis of pneumonia. Hypoxemia upon arrival to the emergency department with oxygen saturation 88% on room air. Crackles in the lung bases on clinical examination. EXAM: CT CHEST WITH CONTRAST TECHNIQUE: Multidetector CT imaging of the chest was performed during intravenous contrast administration. CONTRAST:  19m OMNIPAQUE IOHEXOL 300 MG/ML IV. COMPARISON:  CT chest 06/03/2016 and earlier. Chest x-rays earlier  same day, 05/25/2018 and earlier. FINDINGS: Cardiovascular: Heart mildly enlarged, unchanged. Moderate three-vessel coronary atherosclerosis. No pericardial effusion. Moderate to severe atherosclerosis involving the thoracic and UPPER abdominal aorta without evidence of aneurysm. Atherosclerosis at the origin of the LEFT subclavian artery without evidence of hemodynamically significant stenosis. Atherosclerosis at the origin of the celiac and superior mesenteric arteries without evidence of hemodynamically significant stenosis. Mediastinum/Nodes: Scattered upper normal sized mediastinal lymph nodes, the largest in the subcarinal region (station 7) measuring approximately 1.6 x 1.7 cm, unchanged from prior examinations. No new or enlarging lymphadenopathy. Large hiatal hernia. Normal appearing esophagus. Normal-appearing thyroid gland. Lungs/Pleura: Marked hyperinflation with emphysematous changes throughout both lungs. Peripheral blebs in the deep LATERAL RIGHT lower lobe as noted previously. Airspace opacities throughout both lungs with a nodular acinar pattern, involving the RIGHT upper lobe, RIGHT lower lobe and LEFT lower lobe.  The greatest involvement is in the RIGHT lower lobe. Linear scarring in the lingula, RIGHT middle lobe and both lower lobes, unchanged. No pleural effusions. Central airways patent with marked central bronchial wall thickening. Upper Abdomen: Possible hepatomegaly, though the liver is incompletely imaged. Anatomic variant in that the LEFT lobe of the liver extends well across the midline into the LEFT UPPER quadrant. Apart from the large hiatal hernia, no significant findings elsewhere in the visualized upper abdomen. Musculoskeletal: Severe degenerative disc disease and spondylosis at C6-7, incompletely imaged. Osseous demineralization. Mild to moderate degenerative disc disease and spondylosis involving the mid and LOWER thoracic spine. Remote fractures involving the RIGHT POSTERIOR  eighth rib and the LEFT LATERAL ninth rib. No acute findings. IMPRESSION: 1. Patchy pneumonia involving the BILATERAL lower lobes and the RIGHT upper lobe, with the greatest involvement in the RIGHT lower lobe. Marked central peribronchial thickening indicating asthma and/or bronchitis. 2. Stable mild reactive mediastinal lymphadenopathy. No new or enlarging lymphadenopathy. 3. Large hiatal hernia. 4. Possible hepatomegaly, though the liver is incompletely imaged. 5. Stable mild cardiomegaly. Moderate three-vessel coronary atherosclerosis. Aortic Atherosclerosis (ICD10-I70.0) and Emphysema (ICD10-J43.9). Electronically Signed   By: Evangeline Dakin M.D.   On: 06/01/2018 14:11   Dg Chest Port 1 View  Result Date: 06/18/2018 CLINICAL DATA:  Cough, fall, knee pain EXAM: PORTABLE CHEST 1 VIEW COMPARISON:  06/15/2018 FINDINGS: Cardiomegaly. Retrocardiac left basilar opacity, likely atelectasis. No confluent opacity on the right. No effusions or pneumothorax. Old healed bilateral rib fractures. No visible acute fracture. IMPRESSION: Cardiomegaly.  Left base atelectasis.  No active disease. Electronically Signed   By: Rolm Baptise M.D.   On: 06/18/2018 13:21   Dg Knee Complete 4 Views Left  Result Date: 06/18/2018 CLINICAL DATA:  Mental status change. EXAM: LEFT KNEE - COMPLETE 4+ VIEW COMPARISON:  No recent prior. FINDINGS: Diffuse degenerative change left knee. Chondrocalcinosis noted. No acute bony abnormality. IMPRESSION: Diffuse degenerative change with chondrocalcinosis. No acute abnormality. Electronically Signed   By: Marcello Moores  Register   On: 06/18/2018 13:21    Wt Readings from Last 3 Encounters:  06/18/18 47.2 kg (104 lb)  06/16/18 47.2 kg (104 lb)  06/15/18 46.7 kg (103 lb)    EKG: nsr with prolonged qt  Physical Exam:  Blood pressure (!) 146/73, pulse 78, temperature 97.9 F (36.6 C), temperature source Oral, resp. rate 20, height _0  (1.473 m), weight 47.2 kg (104 lb), SpO2 99 %. Body  mass index is 21.74 kg/m. General: Well developed, well nourished, in no acute distress. Head: Normocephalic, atraumatic, sclera non-icteric, no xanthomas, nares are without discharge.  Neck: Negative for carotid bruits. JVD not elevated. Lungs: Clear bilaterally to auscultation without wheezes, rales, or rhonchi. Breathing is unlabored. Heart: RRR with S1 S2. No murmurs, rubs, or gallops appreciated. Abdomen: Soft, non-tender, non-distended with normoactive bowel sounds. No hepatomegaly. No rebound/guarding. No obvious abdominal masses. Msk:  Strength and tone appear normal for age. Extremities: No clubbing or cyanosis. No edema.  Distal pedal pulses are 2+ and equal bilaterally. Neuro: Alert and oriented X 3. No facial asymmetry. No focal deficit. Moves all extremities spontaneously. Psych:  Somewhat confused. .     Assessment and Plan  67 yo with history of hypertension and several recent admissions with confusion and altered mental status. She has had nausea and vomiting. No dysrhythmia noted. Note to have prolonged qt on the last few admissions. No arrhythmia. Is on several meds that can proong qt including zosyn, ondansetron, pantoprazole. Will d/c pantoprazole  nd would avoid usijng zofran unless necessry and would not ad any new ones if possible. No evidence of dysrhythmi thus far. Will follow with you  Signed, Teodoro Spray MD 06/21/2018, 2:49 PM Pager: [phone]

## 2018-06-22 ENCOUNTER — Telehealth: Payer: Self-pay | Admitting: Family Medicine

## 2018-06-22 LAB — CBC
HCT: 28 % — ABNORMAL LOW (ref 35.0–47.0)
HEMOGLOBIN: 9.2 g/dL — AB (ref 12.0–16.0)
MCH: 29.2 pg (ref 26.0–34.0)
MCHC: 33 g/dL (ref 32.0–36.0)
MCV: 88.6 fL (ref 80.0–100.0)
Platelets: 252 10*3/uL (ref 150–440)
RBC: 3.16 MIL/uL — AB (ref 3.80–5.20)
RDW: 24.4 % — ABNORMAL HIGH (ref 11.5–14.5)
WBC: 2.2 10*3/uL — ABNORMAL LOW (ref 3.6–11.0)

## 2018-06-22 LAB — CREATININE, SERUM
Creatinine, Ser: 0.65 mg/dL (ref 0.44–1.00)
GFR calc Af Amer: >60 mL/min (ref >60)
GFR calc non Af Amer: >60 mL/min (ref >60)

## 2018-06-22 LAB — VANCOMYCIN, TROUGH: Vancomycin Tr: 10 ug/mL — ABNORMAL LOW (ref 15–20)

## 2018-06-22 MED ORDER — MIRTAZAPINE 15 MG PO TBDP: 15.0000 mg | ORAL_TABLET | Freq: Every day | ORAL | 0 refills | Status: DC

## 2018-06-22 MED ORDER — PREDNISONE 10 MG PO TABS: 10.0000 mg | ORAL_TABLET | Freq: Every day | ORAL | 0 refills | Status: AC

## 2018-06-22 MED ORDER — FENTANYL 12 MCG/HR TD PT72: 12.5000 ug | MEDICATED_PATCH | TRANSDERMAL | 0 refills | Status: DC

## 2018-06-22 MED ORDER — AMOXICILLIN-POT CLAVULANATE 500-125 MG PO TABS: 1.0000 | ORAL_TABLET | Freq: Two times a day (BID) | ORAL | 0 refills | Status: AC

## 2018-06-22 MED ORDER — AMOXICILLIN-POT CLAVULANATE 500-125 MG PO TABS
1.0000 | ORAL_TABLET | Freq: Two times a day (BID) | ORAL | Status: DC
  Filled 2018-06-22 (×2): qty 1

## 2018-06-22 NOTE — Plan of Care (Signed)
Pt d/ced home w/home health.  Is much more alert today.  Still not eating a lot and wants to take her meds at home w/her husband.  Able to get BP med in.  Pt is having diarrhea - clear, slightly green w/mucous.  She has had very poor PO intake - not much solids.  Pt has some c/o of pain and is on a fenatanyl patch 12.5 - due to be changed tomorrow.  Alerted her husband.  IV removed.  D/c instructions reviewed, along w/f/u appts. She will transport home with husband.

## 2018-06-22 NOTE — Care Management Important Message (Signed)
Important Message  Patient Details  Name: Cheryl [name] Rolf MRN: [id number] Date of Birth: [birth year]   Medicare Important Message Given:  Yes    Olegario MessierKathy A Apollo Timothy 06/22/2018, 10:53 AM

## 2018-06-22 NOTE — Care Management (Signed)
Discharge to home today per Dr, Amado CoeGouru. Resumption of home health orders for [facility]. Will update Feliberto GottronJason Hinton, [facility] representative.  Palliative Care in the home will resume. Will update Dayna BarkerKaren Robertson, RN [facility] representative. Gwenette GreetBrenda S Mihira Tozzi RN MSN CCM Care Management [phone]

## 2018-06-22 NOTE — Progress Notes (Signed)
Pharmacy Antibiotic Note  Cheryl [name] is a 67 y.o. female admitted on 06/18/2018 with cellulitis.  Pharmacy has been consulted for vancomycin and Zosyn dosing. Zosyn has been d/c Plan: Vancomycin trough therapeutic at 10. Continue current dose.   Height: 4\' 10"  (147.3 cm) Weight: 104 lb 15 oz (47.6 kg)(bed scale) IBW/kg (Calculated) : 40.9  Temp (24hrs), Avg:98 F (36.7 C), Min:97.9 F (36.6 C), Max:98.2 F (36.8 C)  Recent Labs  Lab 06/18/18 1145 06/18/18 1221 06/19/18 0532 06/20/18 1543 06/20/18 1925 06/22/18 0818  WBC 6.6  --  7.5  --   --  2.2*  CREATININE 0.74  --  0.73 0.63  --  0.65  LATICACIDVEN  --  1.0  --   --   --   --   VANCOTROUGH  --   --   --   --  7* 10*    Estimated Creatinine Clearance: 42.9 mL/min (by C-G formula based on SCr of 0.65 mg/dL).    Allergies  Allergen Reactions  . Percocet [Oxycodone-Acetaminophen] Hives and Rash  . Aspirin Nausea And Vomiting and Other (See Comments)    Reaction:  GI upset   . Codeine Nausea And Vomiting, Other (See Comments) and Nausea Only    Reaction:  GI upset   . Propoxyphene Other (See Comments) and Nausea Only    GI upset Reaction:  GI upset   . Sulfa Antibiotics Rash and Other (See Comments)    Reaction:  GI upset     Antimicrobials this admission: Vanc 07/25 >>  Zosyn 07/25 >> 7/29 augmentin 7/29>>  Dose adjustments this admission: 7/27 Vancomycin changed from 500 mg Q18 to Q12  Microbiology results: 07/25 BCx: NG x 2 days  07/25 UCx: NG Final    Thank you for allowing pharmacy to be a part of this patient's care.  Olene FlossMelissa D Maccia, Pharm.D, BCPS Clinical Pharmacist 06/22/2018 9:43 AM

## 2018-06-22 NOTE — Telephone Encounter (Signed)
Patient has appointment for the following: Hospital F/U Provider: Dr. Sherrie MustacheFisher Date & Time 06/29/18 @ 3:20 pm for 40 min slot. Please advise. Thanks TNP

## 2018-06-22 NOTE — Discharge Summary (Signed)
[facility] NAME: Cheryl [name]    MR#:  [id number]  DATE OF BIRTH:  06-23-1951  DATE OF ADMISSION:  06/18/2018 ADMITTING PHYSICIAN: Loletha Grayer, MD  DATE OF DISCHARGE:  06/22/18 PRIMARY CARE PHYSICIAN: Birdie Sons, MD    ADMISSION DIAGNOSIS:  Elevated troponin [R74.8] Altered mental status, unspecified altered mental status type [R41.82]  DISCHARGE DIAGNOSIS:  Active Problems:   Acute encephalopathy   Goals of care, counseling/discussion   Palliative care by specialist   Depression due to physical illness Severe malnutrition Aspiration pneumonia FTT SECONDARY DIAGNOSIS:   Past Medical History:  Diagnosis Date  . Allergy   . Anxiety   . Arthritis   . Aspiration pneumonitis ([place]) 11/24/2015  . Asthma   . Chronic pain   . COPD (chronic obstructive pulmonary disease) ([place])   . Coronary artery disease   . DVT (deep venous thrombosis) ([place])   . GERD (gastroesophageal reflux disease)   . Headache   . Hyperlipidemia   . Hypertension   . Kyphoscoliosis deformity of spine   . Migraines   . Neuropathy 2010  . Osteoporosis   . Oxygen deficiency   . Peripheral vascular disease ([place])   . Pneumonia   . Pneumonia 11/19/2015  . Pneumonia 10/2016  . Pneumonia    aspiration 2018- 4 times in last year  . Vitamin D deficiency     HOSPITAL COURSE:   HPI  Cheryl [name]  is a 67 y.o. female brought in with altered mental status.  She is been vomiting a lot over the last week.  Sunday she had some disorientation.  She has had lower extremity cellulitis.  4 falls since Saturday.  Her legs have severe pain.  Recently put on antibiotics by Dr. Caryn Section for her oxygen level has been low.  He had to force her to put her oxygen on.  One day she did have a fever.  She is been talking out of her head.  In the ER, I had to give the patient a sternal rub and she only moaned.  Husband had noticed some orange material coming out of her  nose.  #Acute on chronic hypoxic respiratory failure.  Secondary to aspiration pneumonia, with history of silent aspiration  Patient is on 5 L of oxygen -weaned off  to 3 L ,lives on  3 L at home, Chest x-ray -basilar infiltrate Patient already on Zosyn and vancomycin  #Refusing to eat and take medications Possible depression psychiatry consult placed patient is agreeable No suicidal ideation  #QT prolongation on telemetry we will get a twelve-lead EKG and check BMP and magnesium Pharmacy  titrateD medications for QT prolongation Cardiology consult -seen by Dr. Ubaldo Glassing. Discontinue Zofran, Zosyn, PPI.  No arrhythmias noticed.  Okay to discharge patient from cardiology standpoint  #Hypokalemia potassium 3.3 replete.  Magnesium normal.  # . Acute encephalopathy.   Resolved back to her baseline .she has had numerous presentations with this. DCed  fentanyl patch at the time of admission and received a low-dose Narcan dose.   Will start the patient on fentanyl 12.5 mcg patch at this time to prevent narcotic withdrawal.  Patient takes 50 mcg at home which is discontinued  # . Bilateral lower extremity cellulitis on chronic venous stasis.  ER physician started antibiotics, continue during the hospital course.  Clinically improved.  Keep legs elevated  #. GERD - H2 blocker  Can be continued.  PPI discontinued in view of  QT prolongation Patient to use over-the-counter Benadryl as needed for nausea.  Discontinued Zofran  # History of COPD with bronchospasm-from underlying pneumonia .  Solu-Medrol added to the regimenContinue nebulizer treatments Clinically improved.  Will discharge home with p.o. prednisone tapering dose  # Weakness physical therapy evaluation-recommending skilled nursing facility but patient prefers going home with home health  #. Relative hypotension hold antihypertensives.  Blood pressure is okay  #Severe malnutrition patient was seen and evaluated by  dietitian continue dietary supplements.  Patient is refusing NG tube placement, refusing dietary supplements, Magic cup also, just eats sherbet Patient, her husband, 2 children at bedside are reporting that patient does much better at home.  She does not like hospital food she would be better off if she goes home and requesting to discharge the patient  #Depression and insomnia with poor appetite Seen by psychiatrist.  Cymbalta discontinued and patient is started on Remeron which will help with the sleep, appetite and depression Outpatient psychotherapy and outpatient follow-up with psychiatry Is needed.  Primary care physician to refer the patient   # Failure to thrive with recurrent admissions-seen by palliative care, recommending outpatient palliative care follow-up.  Full code for now  #Tobacco abuse disorder counseled patient to quit smoking for 5 minutes.  Patient is refusing  Discharge patient home with home health and outpatient palliative care.  Continue 3 L of home O2.  Strict aspiration precautions  Patient is more vulnerable for readmissions as she is silently aspirating and refusing speech therapy instructions dietitian instructions with.  Patient is pretty stubborn and continues to smoke, noncompliant with the diet and instructions She understands the consequences of not following the recommendations by the staff and physicians DISCHARGE CONDITIONS:   Fair  CONSULTS OBTAINED:  Treatment Team:  Teodoro Spray, MD He, Jun, MD   PROCEDURES none  DRUG ALLERGIES:   Allergies  Allergen Reactions  . Percocet [Oxycodone-Acetaminophen] Hives and Rash  . Aspirin Nausea And Vomiting and Other (See Comments)    Reaction:  GI upset   . Codeine Nausea And Vomiting, Other (See Comments) and Nausea Only    Reaction:  GI upset   . Propoxyphene Other (See Comments) and Nausea Only    GI upset Reaction:  GI upset   . Sulfa Antibiotics Rash and Other (See Comments)    Reaction:   GI upset     DISCHARGE MEDICATIONS:   Allergies as of 06/22/2018      Reactions   Percocet [oxycodone-acetaminophen] Hives, Rash   Aspirin Nausea And Vomiting, Other (See Comments)   Reaction:  GI upset    Codeine Nausea And Vomiting, Other (See Comments), Nausea Only   Reaction:  GI upset    Propoxyphene Other (See Comments), Nausea Only   GI upset Reaction:  GI upset    Sulfa Antibiotics Rash, Other (See Comments)   Reaction:  GI upset       Medication List    STOP taking these medications   cephALEXin 500 MG capsule Commonly known as:  KEFLEX   DULoxetine 30 MG capsule Commonly known as:  CYMBALTA   fentaNYL 50 MCG/HR Commonly known as:  DURAGESIC - dosed mcg/hr Replaced by:  fentaNYL 12 MCG/HR   ondansetron 4 MG disintegrating tablet Commonly known as:  ZOFRAN-ODT   ondansetron 4 MG tablet Commonly known as:  ZOFRAN   pantoprazole 40 MG tablet Commonly known as:  PROTONIX   Suvorexant 5 MG Tabs Commonly known as:  BELSOMRA   traZODone 50  MG tablet Commonly known as:  DESYREL     TAKE these medications   acetaminophen 325 MG tablet Commonly known as:  TYLENOL Take 2 tablets (650 mg total) by mouth every 6 (six) hours as needed for mild pain (or Fever >/= 101).   albuterol (2.5 MG/3ML) 0.083% nebulizer solution Commonly known as:  PROVENTIL Take 3 mLs (2.5 mg total) by nebulization every 4 (four) hours as needed for wheezing.   alendronate 70 MG tablet Commonly known as:  FOSAMAX Take 1 tablet (70 mg total) by mouth every 7 (seven) days. Take with a full glass of water on an empty stomach.   ALPRAZolam 0.5 MG tablet Commonly known as:  XANAX Take 0.5 tablets (0.25 mg total) by mouth at bedtime as needed for anxiety. What changed:  how much to take   amLODipine 5 MG tablet Commonly known as:  NORVASC Take 1 tablet (5 mg total) by mouth daily.   amoxicillin-clavulanate 500-125 MG tablet Commonly known as:  AUGMENTIN Take 1 tablet (500 mg total)  by mouth 2 (two) times daily after a meal for 5 days.   celecoxib 100 MG capsule Commonly known as:  CELEBREX Take 1 capsule by mouth as needed for pain.   clopidogrel 75 MG tablet Commonly known as:  PLAVIX Take 75 mg by mouth daily.   diphenoxylate-atropine 2.5-0.025 MG tablet Commonly known as:  LOMOTIL Take 1 tablet by mouth as needed for diarrhea or loose stools.   donepezil 10 MG tablet Commonly known as:  ARICEPT Take 10 mg by mouth at bedtime.   estradiol 0.5 MG tablet Commonly known as:  ESTRACE Take 1 tablet (0.5 mg total) by mouth daily. (Take in place of estropipate) What changed:    when to take this  additional instructions   famotidine 20 MG tablet Commonly known as:  PEPCID Take 1 tablet (20 mg total) by mouth daily.   fentaNYL 12 MCG/HR Commonly known as:  DURAGESIC - dosed mcg/hr Place 1 patch (12.5 mcg total) onto the skin every 3 (three) days. Start taking on:  06/23/2018 Replaces:  fentaNYL 50 MCG/HR   ferrous sulfate 325 (65 FE) MG tablet Take 1 tablet (325 mg total) by mouth 2 (two) times daily with a meal.   Fluticasone-Salmeterol 250-50 MCG/DOSE Aepb Commonly known as:  ADVAIR DISKUS inhale 1 dose by mouth twice a day   furosemide 40 MG tablet Commonly known as:  LASIX Take 1 tablet by mouth daily.   mirtazapine 15 MG disintegrating tablet Commonly known as:  REMERON SOL-TAB Take 1 tablet (15 mg total) by mouth at bedtime.   NARCAN 4 MG/0.1ML Liqd nasal spray kit Generic drug:  naloxone Place 0.4 mg into the nose once.   predniSONE 10 MG tablet Commonly known as:  DELTASONE Take 1 tablet (10 mg total) by mouth daily with breakfast for 5 days.   pregabalin 150 MG capsule Commonly known as:  LYRICA Take 150 mg by mouth 2 (two) times daily.   simvastatin 10 MG tablet Commonly known as:  ZOCOR Take 10 mg by mouth at bedtime.   tiotropium 18 MCG inhalation capsule Commonly known as:  SPIRIVA HANDIHALER inhale the contents of one  capsule in the handihaler once daily   triamcinolone 0.025 % ointment Commonly known as:  KENALOG Apply 1 application topically 2 (two) times daily.   vitamin B-12 1000 MCG tablet Commonly known as:  CYANOCOBALAMIN Take 1 tablet (1,000 mcg total) by mouth daily.  DISCHARGE INSTRUCTIONS:   Follow-up with primary care physician in 2 to 3 days Follow-up with [facility] outpatient psychotherapy and psychiatry-in 1 week Continue 3 L of oxygen Aspiration precautions Outpatient palliative care Home health DIET:  Regular diet  DISCHARGE CONDITION:  Fair  ACTIVITY:  Activity as tolerated per PT  OXYGEN:  Home Oxygen: Yes.     Oxygen Delivery: 3 liters/min via Patient connected to nasal cannula oxygen  DISCHARGE LOCATION:  home   If you experience worsening of your admission symptoms, develop shortness of breath, life threatening emergency, suicidal or homicidal thoughts you must seek medical attention immediately by calling 911 or calling your MD immediately  if symptoms less severe.  You Must read complete instructions/literature along with all the possible adverse reactions/side effects for all the Medicines you take and that have been prescribed to you. Take any new Medicines after you have completely understood and accpet all the possible adverse reactions/side effects.   Please note  You were cared for by a hospitalist during your hospital stay. If you have any questions about your discharge medications or the care you received while you were in the hospital after you are discharged, you can call the unit and asked to speak with the hospitalist on call if the hospitalist that took care of you is not available. Once you are discharged, your primary care physician will handle any further medical issues. Please note that NO REFILLS for any discharge medications will be authorized once you are discharged, as it is imperative that you return to your primary care physician (or  establish a relationship with a primary care physician if you do not have one) for your aftercare needs so that they can reassess your need for medications and monitor your lab values.     Today  Chief Complaint  Patient presents with  . Altered Mental Status   Patient is feeling much better.  Shortness of breath improved.  Patient reports able to eat much better if she goes home.  Continues to smoke refusing to quit  ROS:  CONSTITUTIONAL: Denies fevers, chills. Denies any fatigue, weakness.  EYES: Denies blurry vision, double vision, eye pain. EARS, NOSE, THROAT: Denies tinnitus, ear pain, hearing loss. RESPIRATORY: Denies cough, wheeze, shortness of breath.  CARDIOVASCULAR: Denies chest pain, palpitations, edema.  GASTROINTESTINAL: Denies nausea, vomiting, diarrhea, abdominal pain. Denies bright red blood per rectum. GENITOURINARY: Denies dysuria, hematuria. ENDOCRINE: Denies nocturia or thyroid problems. HEMATOLOGIC AND LYMPHATIC: Denies easy bruising or bleeding. SKIN: Denies rash or lesion. MUSCULOSKELETAL: Denies pain in neck, back, shoulder, knees, hips or arthritic symptoms.  NEUROLOGIC: Denies paralysis, paresthesias.  PSYCHIATRIC: Denies anxiety or depressive symptoms.   VITAL SIGNS:  Blood pressure (!) 146/70, pulse 84, temperature 98.2 F (36.8 C), temperature source Oral, resp. rate 17, height '4\' 10"'$  (1.473 m), weight 47.6 kg (104 lb 15 oz), SpO2 100 %.  I/O:    Intake/Output Summary (Last 24 hours) at 06/22/2018 0943 Last data filed at 06/22/2018 0500 Gross per 24 hour  Intake 1539.78 ml  Output 400 ml  Net 1139.78 ml    PHYSICAL EXAMINATION:  GENERAL:  67 y.o.-year-old patient lying in the bed with no acute distress.  Emaciated  eYES: Pupils equal, round, reactive to light and accommodation. No scleral icterus. Extraocular muscles intact.  HEENT: Head atraumatic, normocephalic. Oropharynx and nasopharynx clear.  NECK:  Supple, no jugular venous distention.  No thyroid enlargement, no tenderness.  LUNGS: moderate breath sounds bilaterally, no wheezing, rales,rhonchi or crepitation. No use  of accessory muscles of respiration.  CARDIOVASCULAR: S1, S2 normal. No murmurs, rubs, or gallops.  ABDOMEN: Soft, non-tender, non-distended. Bowel sounds present. No organomegaly or mass.  EXTREMITIES: No pedal edema, cyanosis, or clubbing.  NEUROLOGIC: Cranial nerves II through XII are intact. Muscle strength global weakness in all extremities. Sensation intact. Gait not checked.  PSYCHIATRIC: The patient is alert and oriented x 3.  SKIN: No obvious rash, lesion, or ulcer.   DATA REVIEW:   CBC Recent Labs  Lab 06/22/18 0818  WBC 2.2*  HGB 9.2*  HCT 28.0*  PLT 252    Chemistries  Recent Labs  Lab 06/18/18 1145  06/20/18 1543 06/22/18 0818  NA 138   < > 144  --   K 3.7   < > 3.3*  --   CL 100   < > 107  --   CO2 27   < > 28  --   GLUCOSE 86   < > 96  --   BUN 17   < > 6*  --   CREATININE 0.74   < > 0.63 0.65  CALCIUM 8.4*   < > 7.7*  --   MG  --   --  2.0  --   AST 21  --   --   --   ALT 23  --   --   --   ALKPHOS 178*  --   --   --   BILITOT 0.6  --   --   --    < > = values in this interval not displayed.    Cardiac Enzymes Recent Labs  Lab 06/18/18 1145  TROPONINI 0.11*    Microbiology Results  Results for orders placed or performed during the hospital encounter of 06/18/18  Blood Culture (routine x 2)     Status: None (Preliminary result)   Collection Time: 06/18/18 11:39 AM  Result Value Ref Range Status   Specimen Description BLOOD RIGHT ANTECUBITAL  Final   Special Requests   Final    BOTTLES DRAWN AEROBIC AND ANAEROBIC Blood Culture adequate volume   Culture   Final    NO GROWTH 4 DAYS Performed at [facility], [address] [place], [place] [address]    Report Status PENDING  Incomplete  Blood Culture (routine x 2)     Status: None (Preliminary result)   Collection Time: 06/18/18 11:45 AM  Result  Value Ref Range Status   Specimen Description BLOOD LEFT ANTECUBITAL  Final   Special Requests   Final    BOTTLES DRAWN AEROBIC AND ANAEROBIC Blood Culture adequate volume   Culture   Final    NO GROWTH 4 DAYS Performed at [facility], [address] [place], [place] [address]    Report Status PENDING  Incomplete  Urine culture     Status: None   Collection Time: 06/18/18 12:22 PM  Result Value Ref Range Status   Specimen Description   Final    URINE, RANDOM Performed at [facility], [address], [place], [place] [address]    Special Requests   Final    NONE Performed at [facility], [address], [place], [place] [address]    Culture   Final    NO GROWTH Performed at [facility], [place] [address] [place], [place] [address]    Report Status 06/20/2018 FINAL  Final    RADIOLOGY:  Dg Chest 2 View  Result Date: 06/20/2018 CLINICAL DATA:  Shortness of breath EXAM: CHEST - 2 VIEW COMPARISON:  June 18, 2018 FINDINGS: Stable cardiomegaly. The hila and mediastinum are normal. No pneumothorax. Healed right posterior rib fracture. Increased interstitial markings. Left basilar opacity. The lower thoracic spine is not well evaluated due to the left basilar opacity. IMPRESSION: 1. Left basilar infiltrate, new since June 15, 2018 and worsened since June 18, 2018. The findings are concerning for pneumonia or aspiration. 2. Cardiomegaly and pulmonary venous congestion. Electronically Signed   By: Dorise Bullion III M.D   On: 06/20/2018 16:16   Ct Head Wo Contrast  Result Date: 06/18/2018 CLINICAL DATA:  Multiple falls over the past few days. Change in mental status. EXAM: CT HEAD WITHOUT CONTRAST TECHNIQUE: Contiguous axial images were obtained from the base of the skull through the vertex without intravenous contrast. COMPARISON:  Brain MRI, 05/18/2018.  Head CT, 09/12/2017. FINDINGS: Brain: No evidence of acute infarction, hemorrhage, hydrocephalus,  extra-axial collection or mass lesion/mass effect. Patchy areas of white matter hypoattenuation are noted consistent with mild to moderate chronic microvascular ischemic change. Vascular: No hyperdense vessel or unexpected calcification. Skull: Normal. Negative for fracture or focal lesion. Sinuses/Orbits: Visualized globes and orbits are unremarkable. Mucosal thickening opacifies many of the ethmoid air cells. Mild mucosal thickening in left frontal and sphenoid sinuses. Clear mastoid air cells. Other: None. IMPRESSION: 1. No acute intracranial abnormalities. 2. Mild to moderate chronic microvascular ischemic change. 3. Sinus mucosal thickening mostly of the ethmoid air cells. Electronically Signed   By: Lajean Manes M.D.   On: 06/18/2018 12:59   Dg Chest Port 1 View  Result Date: 06/18/2018 CLINICAL DATA:  Cough, fall, knee pain EXAM: PORTABLE CHEST 1 VIEW COMPARISON:  06/15/2018 FINDINGS: Cardiomegaly. Retrocardiac left basilar opacity, likely atelectasis. No confluent opacity on the right. No effusions or pneumothorax. Old healed bilateral rib fractures. No visible acute fracture. IMPRESSION: Cardiomegaly.  Left base atelectasis.  No active disease. Electronically Signed   By: Rolm Baptise M.D.   On: 06/18/2018 13:21   Dg Knee Complete 4 Views Left  Result Date: 06/18/2018 CLINICAL DATA:  Mental status change. EXAM: LEFT KNEE - COMPLETE 4+ VIEW COMPARISON:  No recent prior. FINDINGS: Diffuse degenerative change left knee. Chondrocalcinosis noted. No acute bony abnormality. IMPRESSION: Diffuse degenerative change with chondrocalcinosis. No acute abnormality. Electronically Signed   By: Marcello Moores  Register   On: 06/18/2018 13:21    EKG:   Orders placed or performed during the hospital encounter of 06/18/18  . ED EKG 12-Lead  . ED EKG 12-Lead  . EKG 12-Lead  . EKG 12-Lead  . EKG 12-Lead  . EKG 12-Lead      Management plans discussed with the patient, family and they are in agreement.  CODE  STATUS:     Code Status Orders  (From admission, onward)        Start     Ordered   06/18/18 1429  Full code  Continuous     06/18/18 1428    Code Status History    Date Active Date Inactive Code Status Order ID Comments User Context   06/01/2018 1535 06/05/2018 2007 Full Code [id number]  Vaughan Basta, MD Inpatient   05/14/2018 1351 05/21/2018 1558 Partial Code [id number]  Saundra Shelling, MD ED   02/08/2018 1835 02/09/2018 1920 Full Code [id number]  Vaughan Basta, MD Inpatient   02/05/2018 2025 02/07/2018 2119 Full Code [id number]  Hillary Bow, MD ED   04/29/2017 1314 05/02/2017 2148 Full Code [id number]  Epifanio Lesches, MD ED  12/23/2016 1745 12/25/2016 1849 Full Code [id number]  Theodoro Grist, MD ED   11/06/2016 0900 11/07/2016 1853 Full Code [id number]  Max Sane, MD Inpatient   11/05/2016 1401 11/06/2016 0900 Partial Code [id number]  Bettey Costa, MD Inpatient   06/03/2016 0253 06/05/2016 1945 Full Code [id number]  Quintella Baton, MD Inpatient   11/19/2015 1218 11/24/2015 1624 Full Code [id number]  Demetrios Loll, MD Inpatient   11/09/2015 2302 11/13/2015 1851 Full Code [id number]  Lytle Butte, MD ED   10/17/2015 2226 10/22/2015 1303 Full Code [id number]  Fritzi Mandes, MD Inpatient   10/09/2015 1738 10/12/2015 1934 Full Code [id number]  Demetrios Loll, MD Inpatient   08/01/2015 0008 08/03/2015 1454 Full Code [id number]  Lance Coon, MD Inpatient    Advance Directive Documentation     Most Recent Value  Type of Advance Directive  Healthcare Power of Attorney, Living will  Pre-existing out of facility DNR order (yellow form or pink MOST form)  -  "MOST" Form in Place?  -      TOTAL TIME TAKING CARE OF THIS PATIENT: 42  minutes.   Note: This dictation was prepared with Dragon dictation along with smaller phrase technology. Any transcriptional errors that result from this process are unintentional.   '@[facility]'$ @  on 06/22/2018 at 9:43 AM  Between 7am to 6pm - Pager -  [phone]  After 6pm go to [email or web address] - password [email or web address]  Office  [phone]  CC: Primary care physician; Birdie Sons, MD

## 2018-06-22 NOTE — Discharge Instructions (Signed)
Follow-up with primary care physician in 2 to 3 days Follow-up with [facility] outpatient psychotherapy and psychiatry-in 1 week Continue 3 L of oxygen Aspiration precautions Outpatient palliative care Home health

## 2018-06-23 ENCOUNTER — Other Ambulatory Visit: Payer: Self-pay | Admitting: *Deleted

## 2018-06-23 ENCOUNTER — Telehealth: Payer: Self-pay | Admitting: Family Medicine

## 2018-06-23 DIAGNOSIS — G609 Hereditary and idiopathic neuropathy, unspecified: Secondary | ICD-10-CM | POA: Diagnosis not present

## 2018-06-23 DIAGNOSIS — I2510 Atherosclerotic heart disease of native coronary artery without angina pectoris: Secondary | ICD-10-CM | POA: Diagnosis not present

## 2018-06-23 DIAGNOSIS — J189 Pneumonia, unspecified organism: Secondary | ICD-10-CM | POA: Diagnosis not present

## 2018-06-23 DIAGNOSIS — L03116 Cellulitis of left lower limb: Secondary | ICD-10-CM | POA: Diagnosis not present

## 2018-06-23 DIAGNOSIS — I10 Essential (primary) hypertension: Secondary | ICD-10-CM | POA: Diagnosis not present

## 2018-06-23 DIAGNOSIS — M4180 Other forms of scoliosis, site unspecified: Secondary | ICD-10-CM | POA: Diagnosis not present

## 2018-06-23 DIAGNOSIS — J441 Chronic obstructive pulmonary disease with (acute) exacerbation: Secondary | ICD-10-CM | POA: Diagnosis not present

## 2018-06-23 DIAGNOSIS — J440 Chronic obstructive pulmonary disease with acute lower respiratory infection: Secondary | ICD-10-CM | POA: Diagnosis not present

## 2018-06-23 DIAGNOSIS — D509 Iron deficiency anemia, unspecified: Secondary | ICD-10-CM | POA: Diagnosis not present

## 2018-06-23 LAB — CULTURE, BLOOD (ROUTINE X 2)
Culture: NO GROWTH
Culture: NO GROWTH
SPECIAL REQUESTS: ADEQUATE
SPECIAL REQUESTS: ADEQUATE

## 2018-06-23 NOTE — Telephone Encounter (Signed)
That's fine

## 2018-06-23 NOTE — Telephone Encounter (Signed)
Cheryl [name] was given verbal okay.

## 2018-06-23 NOTE — Telephone Encounter (Signed)
Please review

## 2018-06-23 NOTE — Telephone Encounter (Signed)
1. [name] with [facility] is requesting verbal orders for in home nursing as follows:  Twice a week for 3 weeks Once a week until 08/09/18  2. Also requesting to add bath aid to assist pt with bed baths as follows:  Twice a week until 08/09/18  [name] stated pt was discharged from hospital yesterday 06/22/18 and that is why they are requesting verbal orders to continue pt's in home nursing visits. Please advise. Thanks TNP

## 2018-06-23 NOTE — Patient Outreach (Addendum)
[facility] ([facility]) Care Management  06/23/2018  Myonna George Molden 10/26/1951 [id number]   Referral from : Inpatient [facility] Liaison  Referral reason: Admission 6/20-6/27 Dx: Hypoxia,AMS, Pneumonia, abdominal pain . Insurance: [facility] [facility] readmission 7/8-7/12, Dx:: Acute on chronic respiratory failure with hypoxia, Acute on chronic COPD exacerbation , Anemia  Patient with recent readmission on 7/25-7/29 Dx. AMS ,Acute encephalopathy, Palliative care consult depression , Aspiration pneumonia ,FTT  Transition of care by PCP office   Patient is 67 year old female with PMH that includes but not limited to : COPD, GERD,DVT, CAD, hyperlipidemia, neuropathy, chronic pain, falls.  1104 Unsuccessful outreach call to patient , able to leave a HIPAA compliant message for return call. Will attempt call later today.   1400  Return call to patient, successful outreach , HIPAA information verified.  Patient reports that she is feeling alright on today, she discussed being discharged from the hospital on yesterday . Patient reports home health RN has visited on today and they have requested bath aide services be added to her care. Patient discussed that she is wearing her oxygen at 3 liters and saturation are 91%.  Patient discussed what she ate for breakfast sausage and cheese sandwich and plan  sauerkraut for lunch, Patient denies having problems with swallowing , patient reports still having a cough and no fever. Patient declines having softer foods ,states will continue to eat and drink  what she wants and likes.  Patient is speaking with me on speaker phone and asked her husband to speak with me regarding her medications , he states he had new prescriptions filled, understands changes in her medications, Patient was recently discharged from hospital and all medications have been reviewed.  Mr.Gravette discussed patient is mostly sitting in recliner chair or wheel chair, patient is weak and  not able to use walker reports they have BSC for her use, patient sleeps in the recliner chair. He discussed patient is smoking a little less and wearing her oxygen more.  Mr.Brethauer discussed patient upcoming follow up appointments with PCP, pulmonary , reviewed follow up appointment on discharge paperwork to follow up with behavorial health for an appointment, spouse states he will hold off on that for now as patient has improved since being back at home and is eating.  Discussed palliative care consult as well , Mr.Kamau states he was not aware, reviewed  palliative care services and benefits.    Outpatient Encounter Medications as of 06/23/2018  Medication Sig  . amoxicillin-clavulanate (AUGMENTIN) 500-125 MG tablet Take 1 tablet (500 mg total) by mouth 2 (two) times daily after a meal for 5 days.  . fentaNYL (DURAGESIC - DOSED MCG/HR) 12 MCG/HR Place 1 patch (12.5 mcg total) onto the skin every 3 (three) days.  . acetaminophen (TYLENOL) 325 MG tablet Take 2 tablets (650 mg total) by mouth every 6 (six) hours as needed for mild pain (or Fever >/= 101).  . albuterol (PROVENTIL) (2.5 MG/3ML) 0.083% nebulizer solution Take 3 mLs (2.5 mg total) by nebulization every 4 (four) hours as needed for wheezing.  . alendronate (FOSAMAX) 70 MG tablet Take 1 tablet (70 mg total) by mouth every 7 (seven) days. Take with a full glass of water on an empty stomach.  . ALPRAZolam (XANAX) 0.5 MG tablet Take 0.5 tablets (0.25 mg total) by mouth at bedtime as needed for anxiety. (Patient taking differently: Take 0.25-0.5 mg by mouth at bedtime as needed for anxiety. )  . amLODipine (NORVASC) 5 MG tablet Take   1 tablet (5 mg total) by mouth daily.  . celecoxib (CELEBREX) 100 MG capsule Take 1 capsule by mouth as needed for pain.  Marland Kitchen clopidogrel (PLAVIX) 75 MG tablet Take 75 mg by mouth daily.  . diphenoxylate-atropine (LOMOTIL) 2.5-0.025 MG tablet Take 1 tablet by mouth as needed for diarrhea or loose stools.  . donepezil  (ARICEPT) 10 MG tablet Take 10 mg by mouth at bedtime.  Marland Kitchen estradiol (ESTRACE) 0.5 MG tablet Take 1 tablet (0.5 mg total) by mouth daily. (Take in place of estropipate) (Patient taking differently: Take 0.5 mg by mouth every other day. (Take in place of estropipate))  . famotidine (PEPCID) 20 MG tablet Take 1 tablet (20 mg total) by mouth daily.  . ferrous sulfate 325 (65 FE) MG tablet Take 1 tablet (325 mg total) by mouth 2 (two) times daily with a meal.  . Fluticasone-Salmeterol (ADVAIR DISKUS) 250-50 MCG/DOSE AEPB inhale 1 dose by mouth twice a day  . furosemide (LASIX) 40 MG tablet Take 1 tablet by mouth daily.  . mirtazapine (REMERON SOL-TAB) 15 MG disintegrating tablet Take 1 tablet (15 mg total) by mouth at bedtime.  Marland Kitchen NARCAN 4 MG/0.1ML LIQD nasal spray kit Place 0.4 mg into the nose once.   . predniSONE (DELTASONE) 10 MG tablet Take 1 tablet (10 mg total) by mouth daily with breakfast for 5 days.  . pregabalin (LYRICA) 150 MG capsule Take 150 mg by mouth 2 (two) times daily.  . simvastatin (ZOCOR) 10 MG tablet Take 10 mg by mouth at bedtime.   Marland Kitchen tiotropium (SPIRIVA HANDIHALER) 18 MCG inhalation capsule inhale the contents of one capsule in the handihaler once daily  . triamcinolone (KENALOG) 0.025 % ointment Apply 1 application topically 2 (two) times daily.  . vitamin B-12 (CYANOCOBALAMIN) 1000 MCG tablet Take 1 tablet (1,000 mcg total) by mouth daily.   No facility-administered encounter medications on file as of 06/23/2018.   Patient was recently discharged from hospital and all medications have been reviewed.   Discussed home visit , husband and patient agreeable.      Plan  Will plan home visit in the next week.  Placed call to Palliative [facility] services of [place], spoke with Steffanie Dunn to verify referral to Palliative care, she discussed patient is active with Palliative services and her last telephone outreach call was 6/16, discussed patient with 3 recent admission in the last  month she discussed family can request a visit from the NP if they desire.   Call to [facility] home health , able to speak with nurse [name] regarding her visit on today, she discussed patient weak condition, their care plan will involve HHRN, PT and bath aide.   [facility] CM Care Plan Problem One     Most Recent Value  Care Plan Problem One  At risk for readmission related to 2 admits related to COPD, pneumonia, anemia   Role Documenting the Problem One  Care Management Coordinator  Care Plan for Problem One  Active  [facility] Long Term Goal   Patient will be able to report no hospital readmission in the next 31 days  [goal restated ]  [facility] Long Term Goal Start Date  06/23/18 Barrie Folk restart ]  Interventions for Problem One Long Term Goal  Discussed current clinical state emphasized importance of wearing oxygen, and not smoking and rationale.   [facility] CM Short Term Goal #1   Patient will be able to report worsening symptoms of COPD, Pneumonia in the next 30 days  [facility] CM Short Term Goal #1 Start Date  06/23/18  Interventions for Short Term Goal #1  Reviewed worsening signs of pnemonia, fever, increased cough, decreased loc,   [facility] CM Short Term Goal #2   Patient will attend all medical appointment in the next 30 days   [facility] CM Short Term Goal #2 Start Date  06/23/18  Interventions for Short Term Goal #2  Reviewed with spouse follow up medical appointments, and importance of keeping all visit,        , RN, PCCN [facility] Care Management,Care Management Coordinator  [phone]- Mobile [phone]- Toll Free Main Office      

## 2018-06-23 NOTE — Telephone Encounter (Signed)
Transition Care Management Follow-up Telephone Call  Date of discharge and from where: [facility] on 06/22/18.  How have you been since you were released from the hospital? Doing better, declines confusion, pain or n/v/d. Pt is still tired and has no energy.  Any questions or concerns? No   Items Reviewed:  Did the pt receive and understand the discharge instructions provided? Yes   Medications obtained and verified? No, pt declines reviewing meds and states nothing has changed.  Any new allergies since your discharge? No   Dietary orders reviewed? No  Do you have support at home? Yes  Other (ie: DME, Home Health, etc): [facility]  Functional Questionnaire: (I = Independent and D = Dependent) ADL's: wheel chair, walker and oxygen  Bathing/Dressing- D   Meal Prep- D  Eating- I  Maintaining continence- I  Transferring/Ambulation- D, uses above ADLs for assistance.  Managing Meds- D   Follow up appointments reviewed:    PCP Hospital f/u appt confirmed? Yes  Scheduled to see Dr Sherrie MustacheFisher on 06/29/18 @ 3:20 PM.  Specialist Hospital f/u appt confirmed? Yes    Are transportation arrangements needed? No   If their condition worsens, is the pt aware to call  their PCP or go to the ED? Yes  Was the patient provided with contact information for the PCP's office or ED? Yes  Was the pt encouraged to call back with questions or concerns? Yes

## 2018-06-24 ENCOUNTER — Telehealth: Payer: Self-pay

## 2018-06-24 ENCOUNTER — Other Ambulatory Visit: Payer: Self-pay | Admitting: *Deleted

## 2018-06-24 DIAGNOSIS — J441 Chronic obstructive pulmonary disease with (acute) exacerbation: Secondary | ICD-10-CM | POA: Diagnosis not present

## 2018-06-24 DIAGNOSIS — J440 Chronic obstructive pulmonary disease with acute lower respiratory infection: Secondary | ICD-10-CM | POA: Diagnosis not present

## 2018-06-24 DIAGNOSIS — I2510 Atherosclerotic heart disease of native coronary artery without angina pectoris: Secondary | ICD-10-CM | POA: Diagnosis not present

## 2018-06-24 DIAGNOSIS — J189 Pneumonia, unspecified organism: Secondary | ICD-10-CM | POA: Diagnosis not present

## 2018-06-24 DIAGNOSIS — M4180 Other forms of scoliosis, site unspecified: Secondary | ICD-10-CM | POA: Diagnosis not present

## 2018-06-24 DIAGNOSIS — L03116 Cellulitis of left lower limb: Secondary | ICD-10-CM | POA: Diagnosis not present

## 2018-06-24 DIAGNOSIS — G609 Hereditary and idiopathic neuropathy, unspecified: Secondary | ICD-10-CM | POA: Diagnosis not present

## 2018-06-24 DIAGNOSIS — I10 Essential (primary) hypertension: Secondary | ICD-10-CM | POA: Diagnosis not present

## 2018-06-24 DIAGNOSIS — D509 Iron deficiency anemia, unspecified: Secondary | ICD-10-CM | POA: Diagnosis not present

## 2018-06-24 MED ORDER — FAMOTIDINE 20 MG PO TABS: 20.0000 mg | ORAL_TABLET | Freq: Every day | ORAL | 5 refills | Status: DC

## 2018-06-24 MED ORDER — AMLODIPINE BESYLATE 5 MG PO TABS: 5.0000 mg | ORAL_TABLET | Freq: Every day | ORAL | 5 refills | Status: DC

## 2018-06-24 NOTE — Telephone Encounter (Signed)
Chris from [facility] called wanting to get a verbal orer for approval for PT 2x a week for 6 weeks starting next week. KW

## 2018-06-25 DIAGNOSIS — D509 Iron deficiency anemia, unspecified: Secondary | ICD-10-CM | POA: Diagnosis not present

## 2018-06-25 DIAGNOSIS — J189 Pneumonia, unspecified organism: Secondary | ICD-10-CM | POA: Diagnosis not present

## 2018-06-25 DIAGNOSIS — J441 Chronic obstructive pulmonary disease with (acute) exacerbation: Secondary | ICD-10-CM | POA: Diagnosis not present

## 2018-06-25 DIAGNOSIS — I2510 Atherosclerotic heart disease of native coronary artery without angina pectoris: Secondary | ICD-10-CM | POA: Diagnosis not present

## 2018-06-25 DIAGNOSIS — H2512 Age-related nuclear cataract, left eye: Secondary | ICD-10-CM | POA: Diagnosis not present

## 2018-06-25 DIAGNOSIS — J440 Chronic obstructive pulmonary disease with acute lower respiratory infection: Secondary | ICD-10-CM | POA: Diagnosis not present

## 2018-06-25 DIAGNOSIS — M4180 Other forms of scoliosis, site unspecified: Secondary | ICD-10-CM | POA: Diagnosis not present

## 2018-06-25 DIAGNOSIS — I10 Essential (primary) hypertension: Secondary | ICD-10-CM | POA: Diagnosis not present

## 2018-06-25 DIAGNOSIS — G609 Hereditary and idiopathic neuropathy, unspecified: Secondary | ICD-10-CM | POA: Diagnosis not present

## 2018-06-25 DIAGNOSIS — L03116 Cellulitis of left lower limb: Secondary | ICD-10-CM | POA: Diagnosis not present

## 2018-06-25 NOTE — Telephone Encounter (Signed)
That's fine

## 2018-06-25 NOTE — Telephone Encounter (Signed)
Left message for Thayer OhmChris giving him a verbal okay.

## 2018-06-26 DIAGNOSIS — D509 Iron deficiency anemia, unspecified: Secondary | ICD-10-CM | POA: Diagnosis not present

## 2018-06-26 DIAGNOSIS — G609 Hereditary and idiopathic neuropathy, unspecified: Secondary | ICD-10-CM | POA: Diagnosis not present

## 2018-06-26 DIAGNOSIS — M4180 Other forms of scoliosis, site unspecified: Secondary | ICD-10-CM | POA: Diagnosis not present

## 2018-06-26 DIAGNOSIS — J441 Chronic obstructive pulmonary disease with (acute) exacerbation: Secondary | ICD-10-CM | POA: Diagnosis not present

## 2018-06-26 DIAGNOSIS — I10 Essential (primary) hypertension: Secondary | ICD-10-CM | POA: Diagnosis not present

## 2018-06-26 DIAGNOSIS — I2510 Atherosclerotic heart disease of native coronary artery without angina pectoris: Secondary | ICD-10-CM | POA: Diagnosis not present

## 2018-06-26 DIAGNOSIS — L03116 Cellulitis of left lower limb: Secondary | ICD-10-CM | POA: Diagnosis not present

## 2018-06-26 DIAGNOSIS — J440 Chronic obstructive pulmonary disease with acute lower respiratory infection: Secondary | ICD-10-CM | POA: Diagnosis not present

## 2018-06-26 DIAGNOSIS — J189 Pneumonia, unspecified organism: Secondary | ICD-10-CM | POA: Diagnosis not present

## 2018-06-29 ENCOUNTER — Encounter: Payer: Self-pay | Admitting: Family Medicine

## 2018-06-29 ENCOUNTER — Ambulatory Visit (INDEPENDENT_AMBULATORY_CARE_PROVIDER_SITE_OTHER): Payer: Medicare HMO | Admitting: Family Medicine

## 2018-06-29 VITALS — BP 146/76 | HR 85 | Temp 98.5°F | Resp 16

## 2018-06-29 DIAGNOSIS — R630 Anorexia: Secondary | ICD-10-CM | POA: Diagnosis not present

## 2018-06-29 DIAGNOSIS — J189 Pneumonia, unspecified organism: Secondary | ICD-10-CM

## 2018-06-29 DIAGNOSIS — M4180 Other forms of scoliosis, site unspecified: Secondary | ICD-10-CM | POA: Diagnosis not present

## 2018-06-29 DIAGNOSIS — J440 Chronic obstructive pulmonary disease with acute lower respiratory infection: Secondary | ICD-10-CM | POA: Diagnosis not present

## 2018-06-29 DIAGNOSIS — J441 Chronic obstructive pulmonary disease with (acute) exacerbation: Secondary | ICD-10-CM | POA: Diagnosis not present

## 2018-06-29 DIAGNOSIS — L03116 Cellulitis of left lower limb: Secondary | ICD-10-CM | POA: Diagnosis not present

## 2018-06-29 DIAGNOSIS — F0631 Mood disorder due to known physiological condition with depressive features: Secondary | ICD-10-CM

## 2018-06-29 DIAGNOSIS — I10 Essential (primary) hypertension: Secondary | ICD-10-CM | POA: Diagnosis not present

## 2018-06-29 DIAGNOSIS — D509 Iron deficiency anemia, unspecified: Secondary | ICD-10-CM | POA: Diagnosis not present

## 2018-06-29 DIAGNOSIS — I2510 Atherosclerotic heart disease of native coronary artery without angina pectoris: Secondary | ICD-10-CM | POA: Diagnosis not present

## 2018-06-29 DIAGNOSIS — G609 Hereditary and idiopathic neuropathy, unspecified: Secondary | ICD-10-CM | POA: Diagnosis not present

## 2018-06-29 NOTE — Progress Notes (Signed)
Patient: Cheryl [name] Female    DOB: 1951-01-16   67 y.o.   MRN: [id number] Visit Date: 06/29/2018  Today's Provider: Lelon Huh, MD   Chief Complaint  Patient presents with  . Hospitalization Follow-up   Subjective:    HPI   Follow up Hospitalization  Patient was admitted to [facility] on 06/18/2018 and discharged on 06/22/2018. She was treated for Altered Mental State, pneumonia, and Elevated Troponin. Treatment for this included; patient discharged home with prednisone taper. Also discharged with Home Health and Palliative Care. Cymbalta was discontinued and patient was started on Remeron for depression and insomnia with poor appetite. She was advised to follow up with psychiatry but states today she is not depressed, is doing well with current medications, and does not want to see a psychiatrist.  Telephone follow up was done on 06/24/2018 She reports good compliance with treatment. She reports this condition is Improved. Reports appetite is improved.   ---------------------------------------------------------------  Patient states she fells much better since discharge from hospital. Patient has completed prednisone but is still taking amoxicillin.    Allergies  Allergen Reactions  . Percocet [Oxycodone-Acetaminophen] Hives and Rash  . Aspirin Nausea And Vomiting and Other (See Comments)    Reaction:  GI upset   . Codeine Nausea And Vomiting, Other (See Comments) and Nausea Only    Reaction:  GI upset   . Propoxyphene Other (See Comments) and Nausea Only    GI upset Reaction:  GI upset   . Sulfa Antibiotics Rash and Other (See Comments)    Reaction:  GI upset      Current Outpatient Medications:  .  acetaminophen (TYLENOL) 325 MG tablet, Take 2 tablets (650 mg total) by mouth every 6 (six) hours as needed for mild pain (or Fever >/= 101)., Disp: , Rfl:  .  albuterol (PROVENTIL) (2.5 MG/3ML) 0.083% nebulizer solution, Take 3 mLs (2.5 mg total) by nebulization  every 4 (four) hours as needed for wheezing., Disp: 50 vial, Rfl: 12 .  alendronate (FOSAMAX) 70 MG tablet, Take 1 tablet (70 mg total) by mouth every 7 (seven) days. Take with a full glass of water on an empty stomach., Disp: 4 tablet, Rfl: 11 .  ALPRAZolam (XANAX) 0.5 MG tablet, Take 0.5 tablets (0.25 mg total) by mouth at bedtime as needed for anxiety. (Patient taking differently: Take 0.25-0.5 mg by mouth at bedtime as needed for anxiety. ), Disp: 2 tablet, Rfl: 0 .  amLODipine (NORVASC) 5 MG tablet, Take 1 tablet (5 mg total) by mouth daily., Disp: 30 tablet, Rfl: 5 .  amoxicillin-clavulanate (AUGMENTIN) 875-125 MG tablet, Take by mouth., Disp: , Rfl:  .  celecoxib (CELEBREX) 100 MG capsule, Take 1 capsule by mouth as needed for pain., Disp: , Rfl:  .  clopidogrel (PLAVIX) 75 MG tablet, Take 75 mg by mouth daily., Disp: , Rfl:  .  diphenoxylate-atropine (LOMOTIL) 2.5-0.025 MG tablet, Take 1 tablet by mouth as needed for diarrhea or loose stools., Disp: , Rfl:  .  donepezil (ARICEPT) 10 MG tablet, Take 10 mg by mouth at bedtime., Disp: , Rfl:  .  estradiol (ESTRACE) 0.5 MG tablet, Take 1 tablet (0.5 mg total) by mouth daily. (Take in place of estropipate) (Patient taking differently: Take 0.5 mg by mouth every other day. (Take in place of estropipate)), Disp: 30 tablet, Rfl: 5 .  famotidine (PEPCID) 20 MG tablet, Take 1 tablet (20 mg total) by mouth daily., Disp: 30 tablet, Rfl: 5 .  fentaNYL (DURAGESIC - DOSED MCG/HR) 12 MCG/HR, Place 1 patch (12.5 mcg total) onto the skin every 3 (three) days., Disp: 5 patch, Rfl: 0 .  ferrous sulfate 325 (65 FE) MG tablet, Take 1 tablet (325 mg total) by mouth 2 (two) times daily with a meal., Disp: 60 tablet, Rfl: 3 .  Fluticasone-Salmeterol (ADVAIR DISKUS) 250-50 MCG/DOSE AEPB, inhale 1 dose by mouth twice a day, Disp: 60 each, Rfl: 5 .  furosemide (LASIX) 40 MG tablet, Take 1 tablet by mouth daily., Disp: , Rfl:  .  mirtazapine (REMERON SOL-TAB) 15 MG  disintegrating tablet, Take 1 tablet (15 mg total) by mouth at bedtime., Disp: 30 tablet, Rfl: 0 .  NARCAN 4 MG/0.1ML LIQD nasal spray kit, Place 0.4 mg into the nose once. , Disp: , Rfl:  .  pregabalin (LYRICA) 150 MG capsule, Take 150 mg by mouth 2 (two) times daily., Disp: , Rfl:  .  simvastatin (ZOCOR) 10 MG tablet, Take 10 mg by mouth at bedtime. , Disp: , Rfl:  .  tiotropium (SPIRIVA HANDIHALER) 18 MCG inhalation capsule, inhale the contents of one capsule in the handihaler once daily, Disp: 30 capsule, Rfl: 12 .  triamcinolone (KENALOG) 0.025 % ointment, Apply 1 application topically 2 (two) times daily., Disp: 15 g, Rfl: 0 .  vitamin B-12 (CYANOCOBALAMIN) 1000 MCG tablet, Take 1 tablet (1,000 mcg total) by mouth daily., Disp: 30 tablet, Rfl: 0  Review of Systems  Constitutional: Negative for appetite change, chills, fatigue and fever.  Respiratory: Negative for chest tightness and shortness of breath.   Cardiovascular: Negative for chest pain and palpitations.  Gastrointestinal: Negative for abdominal pain, nausea and vomiting.  Neurological: Negative for dizziness and weakness.    Social History   Tobacco Use  . Smoking status: Current Every Day Smoker    Packs/day: 1.00    Years: 50.00    Pack years: 50.00    Types: Cigarettes  . Smokeless tobacco: Never Used  . Tobacco comment: Previously smoked 2 ppd  Substance Use Topics  . Alcohol use: No    Alcohol/week: 0.0 oz   Objective:   BP (!) 146/76 (BP Location: Right Arm, Patient Position: Sitting, Cuff Size: Small)   Pulse 85   Temp 98.5 F (36.9 C) (Oral)   Resp 16   SpO2 97%  Vitals:   06/29/18 1541  BP: (!) 146/76  Pulse: 85  Resp: 16  Temp: 98.5 F (36.9 C)  TempSrc: Oral  SpO2: 97%     Physical Exam   General Appearance:    Alert, cooperative, no distress, thin, mildly cachectic.   Eyes:    PERRL, conjunctiva/corneas clear, EOM's intact       Lungs:     Clear to auscultation bilaterally, respirations  unlabored  Heart:    Regular rate and rhythm  Neurologic:   Awake, alert, oriented x 3. No apparent focal neurological           defect.   Ext:   No erythema of LEs.         Assessment & Plan:     1. Pneumonia due to infectious organism, unspecified laterality, unspecified part of lung Clinically is greatly improved. Follow up pulmonology later this month as scheduled.  - amoxicillin-clavulanate (AUGMENTIN) 875-125 MG tablet; Take by mouth. - DG Chest 2 View; Future  2. Anorexia Improved appetite on Remeron.   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  Lelon Huh, MD  [facility]

## 2018-06-29 NOTE — Patient Instructions (Signed)
Go to the [facility] on [address] for chest Xray  

## 2018-06-30 ENCOUNTER — Telehealth: Payer: Self-pay | Admitting: Family Medicine

## 2018-06-30 ENCOUNTER — Ambulatory Visit
Admission: RE | Admit: 2018-06-30 | Discharge: 2018-06-30 | Disposition: A | Discharge status: Home / Self Care | Payer: Medicare HMO | Source: Ambulatory Visit | Attending: Family Medicine | Admitting: Family Medicine

## 2018-06-30 ENCOUNTER — Other Ambulatory Visit: Payer: Self-pay | Admitting: *Deleted

## 2018-06-30 ENCOUNTER — Telehealth: Payer: Self-pay

## 2018-06-30 ENCOUNTER — Encounter: Payer: Self-pay | Admitting: *Deleted

## 2018-06-30 DIAGNOSIS — I517 Cardiomegaly: Secondary | ICD-10-CM | POA: Diagnosis not present

## 2018-06-30 DIAGNOSIS — I700 Atherosclerosis of aorta: Secondary | ICD-10-CM | POA: Diagnosis not present

## 2018-06-30 DIAGNOSIS — M4180 Other forms of scoliosis, site unspecified: Secondary | ICD-10-CM | POA: Diagnosis not present

## 2018-06-30 DIAGNOSIS — L03116 Cellulitis of left lower limb: Secondary | ICD-10-CM | POA: Diagnosis not present

## 2018-06-30 DIAGNOSIS — G609 Hereditary and idiopathic neuropathy, unspecified: Secondary | ICD-10-CM | POA: Diagnosis not present

## 2018-06-30 DIAGNOSIS — I2510 Atherosclerotic heart disease of native coronary artery without angina pectoris: Secondary | ICD-10-CM | POA: Diagnosis not present

## 2018-06-30 DIAGNOSIS — J9811 Atelectasis: Secondary | ICD-10-CM | POA: Diagnosis not present

## 2018-06-30 DIAGNOSIS — J189 Pneumonia, unspecified organism: Secondary | ICD-10-CM | POA: Insufficient documentation

## 2018-06-30 DIAGNOSIS — I10 Essential (primary) hypertension: Secondary | ICD-10-CM | POA: Diagnosis not present

## 2018-06-30 DIAGNOSIS — D509 Iron deficiency anemia, unspecified: Secondary | ICD-10-CM | POA: Diagnosis not present

## 2018-06-30 DIAGNOSIS — J441 Chronic obstructive pulmonary disease with (acute) exacerbation: Secondary | ICD-10-CM | POA: Diagnosis not present

## 2018-06-30 DIAGNOSIS — J440 Chronic obstructive pulmonary disease with acute lower respiratory infection: Secondary | ICD-10-CM | POA: Diagnosis not present

## 2018-06-30 NOTE — Patient Outreach (Signed)
[facility]) Care Management   06/30/2018  [name] [birth year] [id number]  Cheryl [name] is an 67 y.o. female  Subjective:  Patient reports that she is feeling good this morning , states she has already went and had chest xray done this morning reports doctor wanted to be sure everything was clearing up as he a few crackling sounds in lungs at visit on yesterday.  Patient reports bath aid has visited this morning also Patient reports that she is mostly in her wheelchair or recliner chair during the day, states she needs assistance with transferring from one to the other. Patient discussed her legs are still a little weak and PT doesn't want her using her walker yet.  Patient reports having a fall yesterday when trying to get from outside to inside in her wheel chair states she tilted over but did not get hurt was able to get herself up and inside to bathroom.   Patient discussed that she continues to use oxygen if her saturation levels is less than 90.  Patient reports resting better at night since taking Belsomra. Patient is looking forward to having cataract surgery in the next 2 weeks.   Patient discussed that her appetite is good, and her husband confirms that she is eating through out the day, she reports supper is her best meal.   Objective:  BP 130/70 (BP Location: Right Arm, Patient Position: Sitting, Cuff Size: Normal)   Pulse 96   Resp 18   SpO2 94%   Patient sitting on outside on deck , smoking prior to arrival, awake alert and oriented.  Review of Systems  Constitutional: Negative.   HENT: Negative.   Eyes: Negative.   Cardiovascular: Negative.   Gastrointestinal: Positive for nausea.  Musculoskeletal: Positive for falls and joint pain.  Skin: Negative.   Neurological: Positive for weakness.    Physical Exam  Constitutional: She is oriented to person, place, and time. She appears well-developed and well-nourished.  Cardiovascular: Normal  rate, normal heart sounds and intact distal pulses.  Respiratory: Effort normal. She has no wheezes. She has rales.  Decreased breath sounds at bases and few crackles on left   GI: Soft.  Neurological: She is alert and oriented to person, place, and time.  Skin: Skin is warm and dry.  Psychiatric: She has a normal mood and affect. Her behavior is normal. Judgment and thought content normal.    Encounter Medications:   Outpatient Encounter Medications as of 06/30/2018  Medication Sig Note  . acetaminophen (TYLENOL) 325 MG tablet Take 2 tablets (650 mg total) by mouth every 6 (six) hours as needed for mild pain (or Fever >/= 101).   Marland Kitchen albuterol (PROVENTIL) (2.5 MG/3ML) 0.083% nebulizer solution Take 3 mLs (2.5 mg total) by nebulization every 4 (four) hours as needed for wheezing.   Marland Kitchen alendronate (FOSAMAX) 70 MG tablet Take 1 tablet (70 mg total) by mouth every 7 (seven) days. Take with a full glass of water on an empty stomach. 06/01/2018: Every Monday  . ALPRAZolam (XANAX) 0.5 MG tablet Take 0.5 tablets (0.25 mg total) by mouth at bedtime as needed for anxiety. (Patient taking differently: Take 0.25-0.5 mg by mouth at bedtime as needed for anxiety. )   . amoxicillin-clavulanate (AUGMENTIN) 875-125 MG tablet Take by mouth.   . celecoxib (CELEBREX) 100 MG capsule Take 1 capsule by mouth as needed for pain.   Marland Kitchen clopidogrel (PLAVIX) 75 MG tablet Take 75 mg by mouth daily.   Marland Kitchen  diphenoxylate-atropine (LOMOTIL) 2.5-0.025 MG tablet Take 1 tablet by mouth as needed for diarrhea or loose stools.   . donepezil (ARICEPT) 10 MG tablet Take 10 mg by mouth at bedtime.   Marland Kitchen estradiol (ESTRACE) 0.5 MG tablet Take 1 tablet (0.5 mg total) by mouth daily. (Take in place of estropipate) (Patient taking differently: Take 0.5 mg by mouth every other day. (Take in place of estropipate))   . famotidine (PEPCID) 20 MG tablet Take 1 tablet (20 mg total) by mouth daily.   . fentaNYL (DURAGESIC - DOSED MCG/HR) 12 MCG/HR Place 1  patch (12.5 mcg total) onto the skin every 3 (three) days.   . ferrous sulfate 325 (65 FE) MG tablet Take 1 tablet (325 mg total) by mouth 2 (two) times daily with a meal.   . Fluticasone-Salmeterol (ADVAIR DISKUS) 250-50 MCG/DOSE AEPB inhale 1 dose by mouth twice a day   . NARCAN 4 MG/0.1ML LIQD nasal spray kit Place 0.4 mg into the nose once.    . pregabalin (LYRICA) 150 MG capsule Take 150 mg by mouth 2 (two) times daily.   . simvastatin (ZOCOR) 10 MG tablet Take 10 mg by mouth at bedtime.    . Suvorexant (BELSOMRA) 5 MG TABS Take 5 mg by mouth at bedtime as needed.   . tiotropium (SPIRIVA HANDIHALER) 18 MCG inhalation capsule inhale the contents of one capsule in the handihaler once daily   . triamcinolone (KENALOG) 0.025 % ointment Apply 1 application topically 2 (two) times daily.   . vitamin B-12 (CYANOCOBALAMIN) 1000 MCG tablet Take 1 tablet (1,000 mcg total) by mouth daily.   Marland Kitchen amLODipine (NORVASC) 5 MG tablet Take 1 tablet (5 mg total) by mouth daily. (Patient not taking: Reported on 06/30/2018)   . furosemide (LASIX) 40 MG tablet Take 1 tablet by mouth daily.   . mirtazapine (REMERON SOL-TAB) 15 MG disintegrating tablet Take 1 tablet (15 mg total) by mouth at bedtime. (Patient not taking: Reported on 06/30/2018)    No facility-administered encounter medications on file as of 06/30/2018.     Functional Status:   In your present state of health, do you have any difficulty performing the following activities: 06/18/2018 06/16/2018  Hearing? N N  Vision? N N  Difficulty concentrating or making decisions? Y N  Walking or climbing stairs? Y Y  Comment - uses walker and husband support and ramps   Dressing or bathing? Y N  Comment - husband assist as needed  Doing errands, shopping? Y Y  Comment - husband and family assist as needed   Preparing Food and eating ? - Y  Comment - husband prepares   Using the Toilet? - N  In the past six months, have you accidently leaked urine? - Y  Do you  have problems with loss of bowel control? - N  Managing your Medications? - Y  Comment - husband assist   Managing your Finances? - Y  Comment - husband Manufacturing engineer or managing your Housekeeping? - Y  Comment - husband and family does   Some recent data might be hidden    Fall/Depression Screening:    Fall Risk  06/08/2018 05/22/2018 04/27/2018  Falls in the past year? Yes Yes Yes  Comment - - -  Number falls in past yr: 2 or more - 1  Injury with Fall? No - No  Comment - - -  Risk Factor Category  High Fall Risk - -  Risk for fall due to :  History of fall(s) - -  Risk for fall due to: Comment - - -  Follow up Falls prevention discussed - -   PHQ 2/9 Scores 06/16/2018 04/27/2018 01/22/2018 11/11/2017 09/23/2017 08/12/2017 07/21/2017  PHQ - 2 Score 0 0 0 0 0 0 0  Exception Documentation - - - Patient refusal Patient refusal - -    Assessment:  Routine home visit, patient husband present.  Recent Pneumonia/COPD - on room air with sats 94%, no fever, no increase cough. Reports being in green zone when reviewed. Has upcoming pulmonary office visit. Continues to smoke denies being ready to quit. Reinforced importance of wearing oxygen.  [facility]N CM Care Plan Problem One     Most Recent Value  Care Plan Problem One  At risk for readmission related to 2 admits related to COPD, pneumonia, anemia   Role Documenting the Problem One  Care Management Coordinator  Care Plan for Problem One  Active  [facility] Long Term Goal   Patient will be able to report no hospital readmission in the next 31 days  [goal restated ]  [facility] Long Term Goal Start Date  06/23/18 Barrie Folk restart ]  Interventions for Problem One Long Term Goal  Advised regarding knowing where to go for medical symptoms , discussed notifying PCP office for yellow zone symptoms sooner ,and scheduling office visit, advised regarding wearing oxygen to help breathing .    [facility] CM Short Term Goal #1   Patient will be able to report worsening symptoms  of COPD, Pneumonia in the next 30 days   [facility]N CM Short Term Goal #1 Start Date  06/23/18  Interventions for Short Term Goal #1  Provided and reviewed COPD magnet with zones informaiton .  THN CM Short Term Goal #2   Patient will attend all medical appointment in the next 30 days   [facility]N CM Short Term Goal #2 Start Date  06/23/18  Interventions for Short Term Goal #2  Discussed with patient upcoming appointment with pullmonary MD       Joylene Draft, RN, [place] Management Coordinator  [phone]- Mobile [phone]- Toll Free Main Office  Fall Risk - recent fall no injury , continuing participation with home health PT, has new smaller wheel chair on order. Will review fall precautions.   Left Lower extremity cellulitis -redness improved, one dose to complete antibiotic course.  Palliative Care- patient full code, and followed by Hospice/palliative care of [place] palliative care telephonic program. Patient ask what  palliative care is but recalls receiving phone call.  Medications- noted on medication review on chart , patient not taking Remeron, but she is taking Belsomra recently filled on 7/29 .    Plan:  Will plan follow up call in the next 2 weeks.. Provided EMMI handout on Palliative care with review Provided COPD magnet  Will send PCP this  visit note.    THN CM Care Plan Problem One     Most Recent Value  Care Plan Problem One  At risk for readmission related to 2 admits related to COPD, pneumonia, anemia   Role Documenting the Problem One  Care Management Coordinator  Care Plan for Problem One  Active  [facility] Long Term Goal   Patient will be able to report no hospital readmission in the next 31 days  [goal restated ]  [facility]N Long Term Goal Start Date  06/23/18 Barrie Folk restart ]  Interventions for Problem One Long Term Goal  Advised regarding knowing where to go for  medical symptoms , discussed notifying PCP office for yellow zone symptoms sooner ,and  scheduling office visit, advised regarding wearing oxygen to help breathing .    [facility]N CM Short Term Goal #1   Patient will be able to report worsening symptoms of COPD, Pneumonia in the next 30 days   [facility]N CM Short Term Goal #1 Start Date  06/23/18  Interventions for Short Term Goal #1  Provided and reviewed COPD magnet with zones informaiton .  THN CM Short Term Goal #2   Patient will attend all medical appointment in the next 30 days   [facility]N CM Short Term Goal #2 Start Date  06/23/18  Interventions for Short Term Goal #2  Discussed with patient upcoming appointment with pullmonary MD       Joylene Draft, RN, [place] Management Coordinator  [phone]- Mobile [phone]- Toll Free Main Office

## 2018-06-30 NOTE — Telephone Encounter (Signed)
Please advise 

## 2018-06-30 NOTE — Telephone Encounter (Signed)
Patients husband [name] advised as below and verbally voiced understanding. He was unable to schedule a 1 month follow up appointment at this time because he says" he was in the middle of something" when I called. He states he was going to call back to schedule appointment when he was free.

## 2018-06-30 NOTE — Telephone Encounter (Signed)
pt states she was in yesterday to see Dr. Sherrie MustacheFisher for a hospital FU.  She is having Cataract surgery on 8/19 and wants to know if you will clear her for surgery.  She said she talked to the nurse about it but not Dr. Sherrie MustacheFisher.  I did tell her she may need another visit but I wasn't sure.  Pt's call back is [phone]  Thanks teri

## 2018-06-30 NOTE — Telephone Encounter (Signed)
-----   Message from Malva Limesonald E Fisher, MD sent at 06/30/2018  8:40 AM EDT ----- Chest xray has cleared, pneumonia is gone. Finish abs prescribed from hospital, but do not need any more. Follow up in 1 month. Can cancel tomorrows appointment which was made before her recent hospitalization.

## 2018-07-01 ENCOUNTER — Ambulatory Visit: Payer: Self-pay | Admitting: Family Medicine

## 2018-07-02 DIAGNOSIS — L03116 Cellulitis of left lower limb: Secondary | ICD-10-CM | POA: Diagnosis not present

## 2018-07-02 DIAGNOSIS — I2510 Atherosclerotic heart disease of native coronary artery without angina pectoris: Secondary | ICD-10-CM | POA: Diagnosis not present

## 2018-07-02 DIAGNOSIS — J440 Chronic obstructive pulmonary disease with acute lower respiratory infection: Secondary | ICD-10-CM | POA: Diagnosis not present

## 2018-07-02 DIAGNOSIS — J189 Pneumonia, unspecified organism: Secondary | ICD-10-CM | POA: Diagnosis not present

## 2018-07-02 DIAGNOSIS — J441 Chronic obstructive pulmonary disease with (acute) exacerbation: Secondary | ICD-10-CM | POA: Diagnosis not present

## 2018-07-02 DIAGNOSIS — G609 Hereditary and idiopathic neuropathy, unspecified: Secondary | ICD-10-CM | POA: Diagnosis not present

## 2018-07-02 DIAGNOSIS — I10 Essential (primary) hypertension: Secondary | ICD-10-CM | POA: Diagnosis not present

## 2018-07-02 DIAGNOSIS — D509 Iron deficiency anemia, unspecified: Secondary | ICD-10-CM | POA: Diagnosis not present

## 2018-07-02 DIAGNOSIS — M4180 Other forms of scoliosis, site unspecified: Secondary | ICD-10-CM | POA: Diagnosis not present

## 2018-07-02 NOTE — Telephone Encounter (Signed)
Pre-op clearance was completed a few days ago. I don't know if it has been faxed yet.

## 2018-07-03 DIAGNOSIS — J440 Chronic obstructive pulmonary disease with acute lower respiratory infection: Secondary | ICD-10-CM | POA: Diagnosis not present

## 2018-07-03 DIAGNOSIS — M4180 Other forms of scoliosis, site unspecified: Secondary | ICD-10-CM | POA: Diagnosis not present

## 2018-07-03 DIAGNOSIS — I2510 Atherosclerotic heart disease of native coronary artery without angina pectoris: Secondary | ICD-10-CM | POA: Diagnosis not present

## 2018-07-03 DIAGNOSIS — J189 Pneumonia, unspecified organism: Secondary | ICD-10-CM | POA: Diagnosis not present

## 2018-07-03 DIAGNOSIS — G609 Hereditary and idiopathic neuropathy, unspecified: Secondary | ICD-10-CM | POA: Diagnosis not present

## 2018-07-03 DIAGNOSIS — J441 Chronic obstructive pulmonary disease with (acute) exacerbation: Secondary | ICD-10-CM | POA: Diagnosis not present

## 2018-07-03 DIAGNOSIS — L03116 Cellulitis of left lower limb: Secondary | ICD-10-CM | POA: Diagnosis not present

## 2018-07-03 DIAGNOSIS — I10 Essential (primary) hypertension: Secondary | ICD-10-CM | POA: Diagnosis not present

## 2018-07-03 DIAGNOSIS — D509 Iron deficiency anemia, unspecified: Secondary | ICD-10-CM | POA: Diagnosis not present

## 2018-07-03 NOTE — Telephone Encounter (Signed)
Form was faxed. Patient was notified.

## 2018-07-06 ENCOUNTER — Encounter: Payer: Self-pay | Admitting: Anesthesiology

## 2018-07-06 ENCOUNTER — Encounter: Payer: Self-pay | Admitting: *Deleted

## 2018-07-06 ENCOUNTER — Other Ambulatory Visit: Payer: Self-pay

## 2018-07-06 ENCOUNTER — Telehealth: Payer: Self-pay | Admitting: Family Medicine

## 2018-07-06 DIAGNOSIS — J441 Chronic obstructive pulmonary disease with (acute) exacerbation: Secondary | ICD-10-CM | POA: Diagnosis not present

## 2018-07-06 DIAGNOSIS — I2510 Atherosclerotic heart disease of native coronary artery without angina pectoris: Secondary | ICD-10-CM | POA: Diagnosis not present

## 2018-07-06 DIAGNOSIS — D509 Iron deficiency anemia, unspecified: Secondary | ICD-10-CM | POA: Diagnosis not present

## 2018-07-06 DIAGNOSIS — J189 Pneumonia, unspecified organism: Secondary | ICD-10-CM | POA: Diagnosis not present

## 2018-07-06 DIAGNOSIS — I10 Essential (primary) hypertension: Secondary | ICD-10-CM | POA: Diagnosis not present

## 2018-07-06 DIAGNOSIS — J440 Chronic obstructive pulmonary disease with acute lower respiratory infection: Secondary | ICD-10-CM | POA: Diagnosis not present

## 2018-07-06 DIAGNOSIS — M4180 Other forms of scoliosis, site unspecified: Secondary | ICD-10-CM | POA: Diagnosis not present

## 2018-07-06 DIAGNOSIS — G609 Hereditary and idiopathic neuropathy, unspecified: Secondary | ICD-10-CM | POA: Diagnosis not present

## 2018-07-06 DIAGNOSIS — L03116 Cellulitis of left lower limb: Secondary | ICD-10-CM | POA: Diagnosis not present

## 2018-07-06 NOTE — Telephone Encounter (Signed)
Called Alvino Chapelllen back at [facility]. Per Thurmon FairEllen, [place] canceled surgery do to patient's history of encephalopathy and other preexisting conditions. [place] is now needing medical clearance from Dr. Sherrie MustacheFisher and patient's cardiologist.

## 2018-07-06 NOTE — Telephone Encounter (Signed)
Pt is needing medical clearance for cataract surgery.  She is not scheduled yet.  Pt has history of encephalopathy.  [facility] will not do her surgery.  [facility] needs the medical clearance before the surgery can be scheduled  Fax [phone]  [facility] CB # [phone]  Thanks Barth Kirksteri

## 2018-07-07 ENCOUNTER — Other Ambulatory Visit: Payer: Self-pay | Admitting: Family Medicine

## 2018-07-07 DIAGNOSIS — J441 Chronic obstructive pulmonary disease with (acute) exacerbation: Secondary | ICD-10-CM | POA: Diagnosis not present

## 2018-07-07 DIAGNOSIS — I2510 Atherosclerotic heart disease of native coronary artery without angina pectoris: Secondary | ICD-10-CM | POA: Diagnosis not present

## 2018-07-07 DIAGNOSIS — R630 Anorexia: Secondary | ICD-10-CM | POA: Insufficient documentation

## 2018-07-07 DIAGNOSIS — J189 Pneumonia, unspecified organism: Secondary | ICD-10-CM | POA: Diagnosis not present

## 2018-07-07 DIAGNOSIS — M79604 Pain in right leg: Secondary | ICD-10-CM

## 2018-07-07 DIAGNOSIS — G609 Hereditary and idiopathic neuropathy, unspecified: Secondary | ICD-10-CM | POA: Diagnosis not present

## 2018-07-07 DIAGNOSIS — J440 Chronic obstructive pulmonary disease with acute lower respiratory infection: Secondary | ICD-10-CM | POA: Diagnosis not present

## 2018-07-07 DIAGNOSIS — D509 Iron deficiency anemia, unspecified: Secondary | ICD-10-CM | POA: Diagnosis not present

## 2018-07-07 DIAGNOSIS — M4180 Other forms of scoliosis, site unspecified: Secondary | ICD-10-CM | POA: Diagnosis not present

## 2018-07-07 DIAGNOSIS — L03116 Cellulitis of left lower limb: Secondary | ICD-10-CM | POA: Diagnosis not present

## 2018-07-07 DIAGNOSIS — I10 Essential (primary) hypertension: Secondary | ICD-10-CM | POA: Diagnosis not present

## 2018-07-07 NOTE — Telephone Encounter (Signed)
Pt states she is now waiting for an apt to have her cataracts removed. Pt to CB when she's ready to schedule her AWV. -MM

## 2018-07-08 ENCOUNTER — Ambulatory Visit: Payer: Medicare HMO | Admitting: Internal Medicine

## 2018-07-08 ENCOUNTER — Ambulatory Visit (INDEPENDENT_AMBULATORY_CARE_PROVIDER_SITE_OTHER): Payer: Medicare HMO | Admitting: Family Medicine

## 2018-07-08 ENCOUNTER — Encounter: Payer: Self-pay | Admitting: Family Medicine

## 2018-07-08 VITALS — BP 135/76 | HR 89 | Temp 97.7°F | Resp 16

## 2018-07-08 DIAGNOSIS — Z7409 Other reduced mobility: Secondary | ICD-10-CM

## 2018-07-08 DIAGNOSIS — R609 Edema, unspecified: Secondary | ICD-10-CM

## 2018-07-08 DIAGNOSIS — J449 Chronic obstructive pulmonary disease, unspecified: Secondary | ICD-10-CM | POA: Diagnosis not present

## 2018-07-08 DIAGNOSIS — J9620 Acute and chronic respiratory failure, unspecified whether with hypoxia or hypercapnia: Secondary | ICD-10-CM

## 2018-07-08 DIAGNOSIS — R29898 Other symptoms and signs involving the musculoskeletal system: Secondary | ICD-10-CM

## 2018-07-08 MED ORDER — LEVOFLOXACIN 750 MG PO TABS: 750.0000 mg | ORAL_TABLET | Freq: Every day | ORAL | 0 refills | Status: AC

## 2018-07-08 NOTE — Telephone Encounter (Signed)
O.v scheduled 07-08-2018

## 2018-07-08 NOTE — Progress Notes (Signed)
Patient: Cheryl [name] Female    DOB: 11-Jan-1951   67 y.o.   MRN: [id number] Visit Date: 07/08/2018  Today's Provider: Lelon Huh, MD   Chief Complaint  Patient presents with  . Medical Clearance   Subjective:    HPI  Patient is in office for medical clearance for Cataract surgery.   Sees Dr Humphrey Rolls tomorrow. For cardiac clearance. She has not yet had follow up scheduled with pulmonary.   She also needs order for new wheelchair due to weakness and edema. She has advanced COPD and was recently discharged for pneumonia and COPD exacerbation. She is improved, but still very short of breath on minimal exertion. Continue to have weakness in both LEs and cannot ambulate to complete her normal activities of daily living. She has some upper extremity weakness limiting her ability to propel herself in a standard wheelchair.   Allergies  Allergen Reactions  . Percocet [Oxycodone-Acetaminophen] Hives and Rash  . Aspirin Nausea And Vomiting and Other (See Comments)    Reaction:  GI upset   . Codeine Nausea And Vomiting, Other (See Comments) and Nausea Only    Reaction:  GI upset   . Propoxyphene Other (See Comments) and Nausea Only    GI upset Reaction:  GI upset   . Sulfa Antibiotics Rash and Other (See Comments)    Reaction:  GI upset      Current Outpatient Medications:  .  acetaminophen (TYLENOL) 325 MG tablet, Take 2 tablets (650 mg total) by mouth every 6 (six) hours as needed for mild pain (or Fever >/= 101)., Disp: , Rfl:  .  albuterol (PROVENTIL HFA;VENTOLIN HFA) 108 (90 Base) MCG/ACT inhaler, Inhale into the lungs every 6 (six) hours as needed for wheezing or shortness of breath., Disp: , Rfl:  .  albuterol (PROVENTIL) (2.5 MG/3ML) 0.083% nebulizer solution, Take 3 mLs (2.5 mg total) by nebulization every 4 (four) hours as needed for wheezing., Disp: 50 vial, Rfl: 12 .  alendronate (FOSAMAX) 70 MG tablet, Take 1 tablet (70 mg total) by mouth every 7 (seven) days. Take  with a full glass of water on an empty stomach., Disp: 4 tablet, Rfl: 11 .  ALPRAZolam (XANAX) 0.5 MG tablet, Take 0.5 tablets (0.25 mg total) by mouth at bedtime as needed for anxiety. (Patient taking differently: Take 0.25-0.5 mg by mouth at bedtime as needed for anxiety. ), Disp: 2 tablet, Rfl: 0 .  amLODipine (NORVASC) 5 MG tablet, Take 1 tablet (5 mg total) by mouth daily., Disp: 30 tablet, Rfl: 5 .  celecoxib (CELEBREX) 100 MG capsule, Take 1 capsule (100 mg total) by mouth 2 (two) times daily as needed., Disp: 60 capsule, Rfl: 2 .  clopidogrel (PLAVIX) 75 MG tablet, Take 75 mg by mouth daily., Disp: , Rfl:  .  diphenoxylate-atropine (LOMOTIL) 2.5-0.025 MG tablet, Take 1 tablet by mouth as needed for diarrhea or loose stools., Disp: , Rfl:  .  donepezil (ARICEPT) 10 MG tablet, Take 10 mg by mouth at bedtime., Disp: , Rfl:  .  estradiol (ESTRACE) 0.5 MG tablet, Take 1 tablet (0.5 mg total) by mouth daily. (Take in place of estropipate) (Patient taking differently: Take 0.5 mg by mouth every other day. (Take in place of estropipate)), Disp: 30 tablet, Rfl: 5 .  famotidine (PEPCID) 20 MG tablet, Take 1 tablet (20 mg total) by mouth daily., Disp: 30 tablet, Rfl: 5 .  fentaNYL (DURAGESIC - DOSED MCG/HR) 12 MCG/HR, Place 1 patch (12.5  mcg total) onto the skin every 3 (three) days., Disp: 5 patch, Rfl: 0 .  ferrous sulfate 325 (65 FE) MG tablet, Take 1 tablet (325 mg total) by mouth 2 (two) times daily with a meal., Disp: 60 tablet, Rfl: 3 .  Fluticasone-Salmeterol (ADVAIR DISKUS) 250-50 MCG/DOSE AEPB, inhale 1 dose by mouth twice a day, Disp: 60 each, Rfl: 2 .  furosemide (LASIX) 40 MG tablet, Take 1 tablet by mouth daily., Disp: , Rfl:  .  mirtazapine (REMERON SOL-TAB) 15 MG disintegrating tablet, Take 1 tablet (15 mg total) by mouth at bedtime., Disp: 30 tablet, Rfl: 0 .  NARCAN 4 MG/0.1ML LIQD nasal spray kit, Place 0.4 mg into the nose once. , Disp: , Rfl:  .  pantoprazole (PROTONIX) 40 MG tablet,  Take 40 mg by mouth daily., Disp: , Rfl:  .  pregabalin (LYRICA) 150 MG capsule, Take 150 mg by mouth 2 (two) times daily., Disp: , Rfl:  .  silver sulfADIAZINE (SILVADENE) 1 % cream, Apply 1 application topically daily as needed., Disp: , Rfl:  .  simvastatin (ZOCOR) 10 MG tablet, Take 10 mg by mouth at bedtime. , Disp: , Rfl:  .  Suvorexant (BELSOMRA) 5 MG TABS, Take 5 mg by mouth at bedtime as needed., Disp: , Rfl:  .  tiotropium (SPIRIVA HANDIHALER) 18 MCG inhalation capsule, inhale the contents of one capsule in the handihaler once daily, Disp: 30 capsule, Rfl: 12 .  triamcinolone (KENALOG) 0.025 % ointment, Apply 1 application topically 2 (two) times daily., Disp: 15 g, Rfl: 0 .  vitamin B-12 (CYANOCOBALAMIN) 1000 MCG tablet, Take 1 tablet (1,000 mcg total) by mouth daily., Disp: 30 tablet, Rfl: 0  Review of Systems  Constitutional: Negative for appetite change, chills, fatigue and fever.  Respiratory: Negative for chest tightness and shortness of breath.   Cardiovascular: Negative for chest pain and palpitations.  Gastrointestinal: Negative for abdominal pain, nausea and vomiting.  Neurological: Negative for dizziness and weakness.    Social History   Tobacco Use  . Smoking status: Current Every Day Smoker    Packs/day: 1.00    Years: 50.00    Pack years: 50.00    Types: Cigarettes  . Smokeless tobacco: Never Used  . Tobacco comment: Previously smoked 2 ppd  Substance Use Topics  . Alcohol use: No    Alcohol/week: 0.0 standard drinks   Objective:   BP 135/76 (BP Location: Right Arm, Patient Position: Supine, Cuff Size: Small)   Pulse 89   Temp 97.7 F (36.5 C) (Oral)   Resp 16   SpO2 92%  Vitals:   07/08/18 1624  BP: 135/76  Pulse: 89  Resp: 16  Temp: 97.7 F (36.5 C)  TempSrc: Oral  SpO2: 92%     Physical Exam   General Appearance:    Alert, cooperative, no distress, thin slightly cachectic appearing female in no acute distress.   Eyes:    PERRL,  conjunctiva/corneas clear, EOM's intact       Lungs:     Clear to auscultation bilaterally, respirations unlabored  Heart:    Regular rate and rhythm. 1+ bipedal edeam.   Neurologic:   Awake, alert, oriented x 3. No apparent focal neurological           defect.   MS:   +4 LE and UE strength. She is unable to stand without assistance.        Assessment & Plan:     1. Chronic obstructive pulmonary disease, unspecified COPD  type [facility]) Multiple hospitalization for pneumonia and COPD exacerbations. Need to establish outpatient follow up with pulmonary. Has significantly improve since hospitalization in July. Continue current inhalers and strongly advised to quit smoking permanently.   - Ambulatory referral to Pulmonology  2. Chronic edema Stable. Continue current medications.    3. Acute on chronic respiratory failure, unspecified whether with hypoxia or hypercapnia (HCC) Continue current inhalers and refer pulmonary for outpatient follow up as above.   4. Weakness of both legs Requires lightweight wheelchair to complete ADLs. She cannot propel herself in a standard weight wheelchair due to UE weakness.   5. Upper extremity weakness   6. Limited mobility Reviewed and completed forms for evaluation of need for lightweight wheelchair.   7. Pre-procedure-clearance.  Is to follow up with Dr. Humphrey Rolls for cardiac clearance. There is not non-cardiac contraindication or restriction for surgery. Forms completed for pre-procedure clearance.   There are other unrelated non-urgent complaints, but due to the busy schedule and the amount of time I've already spent with her, time does not permit me to address these routine issues at today's visit. I've requested another appointment to review these additional issues.       Lelon Huh, MD  [facility]

## 2018-07-09 ENCOUNTER — Encounter: Payer: Self-pay | Admitting: Internal Medicine

## 2018-07-09 DIAGNOSIS — R0602 Shortness of breath: Secondary | ICD-10-CM | POA: Diagnosis not present

## 2018-07-09 DIAGNOSIS — I200 Unstable angina: Secondary | ICD-10-CM | POA: Diagnosis not present

## 2018-07-09 DIAGNOSIS — F17210 Nicotine dependence, cigarettes, uncomplicated: Secondary | ICD-10-CM | POA: Diagnosis not present

## 2018-07-09 DIAGNOSIS — K210 Gastro-esophageal reflux disease with esophagitis: Secondary | ICD-10-CM | POA: Diagnosis not present

## 2018-07-09 DIAGNOSIS — R9431 Abnormal electrocardiogram [ECG] [EKG]: Secondary | ICD-10-CM | POA: Diagnosis not present

## 2018-07-09 DIAGNOSIS — I10 Essential (primary) hypertension: Secondary | ICD-10-CM | POA: Diagnosis not present

## 2018-07-10 DIAGNOSIS — J189 Pneumonia, unspecified organism: Secondary | ICD-10-CM | POA: Diagnosis not present

## 2018-07-10 DIAGNOSIS — J440 Chronic obstructive pulmonary disease with acute lower respiratory infection: Secondary | ICD-10-CM | POA: Diagnosis not present

## 2018-07-10 DIAGNOSIS — M4180 Other forms of scoliosis, site unspecified: Secondary | ICD-10-CM | POA: Diagnosis not present

## 2018-07-10 DIAGNOSIS — I10 Essential (primary) hypertension: Secondary | ICD-10-CM | POA: Diagnosis not present

## 2018-07-10 DIAGNOSIS — L03116 Cellulitis of left lower limb: Secondary | ICD-10-CM | POA: Diagnosis not present

## 2018-07-10 DIAGNOSIS — G609 Hereditary and idiopathic neuropathy, unspecified: Secondary | ICD-10-CM | POA: Diagnosis not present

## 2018-07-10 DIAGNOSIS — J441 Chronic obstructive pulmonary disease with (acute) exacerbation: Secondary | ICD-10-CM | POA: Diagnosis not present

## 2018-07-10 DIAGNOSIS — D509 Iron deficiency anemia, unspecified: Secondary | ICD-10-CM | POA: Diagnosis not present

## 2018-07-10 DIAGNOSIS — I2510 Atherosclerotic heart disease of native coronary artery without angina pectoris: Secondary | ICD-10-CM | POA: Diagnosis not present

## 2018-07-13 ENCOUNTER — Ambulatory Visit: Admission: RE | Admit: 2018-07-13 | Payer: Medicare HMO | Source: Ambulatory Visit | Admitting: Ophthalmology

## 2018-07-13 DIAGNOSIS — R079 Chest pain, unspecified: Secondary | ICD-10-CM | POA: Diagnosis not present

## 2018-07-13 HISTORY — DX: Presence of dental prosthetic device (complete) (partial): Z97.2

## 2018-07-13 SURGERY — PHACOEMULSIFICATION, CATARACT, WITH IOL INSERTION
Anesthesia: Topical | Laterality: Left

## 2018-07-14 DIAGNOSIS — D509 Iron deficiency anemia, unspecified: Secondary | ICD-10-CM | POA: Diagnosis not present

## 2018-07-14 DIAGNOSIS — I2510 Atherosclerotic heart disease of native coronary artery without angina pectoris: Secondary | ICD-10-CM | POA: Diagnosis not present

## 2018-07-14 DIAGNOSIS — G609 Hereditary and idiopathic neuropathy, unspecified: Secondary | ICD-10-CM | POA: Diagnosis not present

## 2018-07-14 DIAGNOSIS — L03116 Cellulitis of left lower limb: Secondary | ICD-10-CM | POA: Diagnosis not present

## 2018-07-14 DIAGNOSIS — J449 Chronic obstructive pulmonary disease, unspecified: Secondary | ICD-10-CM | POA: Diagnosis not present

## 2018-07-14 DIAGNOSIS — M4180 Other forms of scoliosis, site unspecified: Secondary | ICD-10-CM | POA: Diagnosis not present

## 2018-07-14 DIAGNOSIS — J440 Chronic obstructive pulmonary disease with acute lower respiratory infection: Secondary | ICD-10-CM | POA: Diagnosis not present

## 2018-07-14 DIAGNOSIS — J441 Chronic obstructive pulmonary disease with (acute) exacerbation: Secondary | ICD-10-CM | POA: Diagnosis not present

## 2018-07-14 DIAGNOSIS — J189 Pneumonia, unspecified organism: Secondary | ICD-10-CM | POA: Diagnosis not present

## 2018-07-14 DIAGNOSIS — I10 Essential (primary) hypertension: Secondary | ICD-10-CM | POA: Diagnosis not present

## 2018-07-15 DIAGNOSIS — R0602 Shortness of breath: Secondary | ICD-10-CM | POA: Diagnosis not present

## 2018-07-15 DIAGNOSIS — R079 Chest pain, unspecified: Secondary | ICD-10-CM | POA: Diagnosis not present

## 2018-07-15 DIAGNOSIS — R072 Precordial pain: Secondary | ICD-10-CM | POA: Diagnosis not present

## 2018-07-15 DIAGNOSIS — I2510 Atherosclerotic heart disease of native coronary artery without angina pectoris: Secondary | ICD-10-CM | POA: Diagnosis not present

## 2018-07-16 ENCOUNTER — Other Ambulatory Visit: Payer: Self-pay | Admitting: *Deleted

## 2018-07-16 DIAGNOSIS — J449 Chronic obstructive pulmonary disease, unspecified: Secondary | ICD-10-CM | POA: Diagnosis not present

## 2018-07-16 DIAGNOSIS — I2510 Atherosclerotic heart disease of native coronary artery without angina pectoris: Secondary | ICD-10-CM | POA: Diagnosis not present

## 2018-07-16 DIAGNOSIS — J189 Pneumonia, unspecified organism: Secondary | ICD-10-CM | POA: Diagnosis not present

## 2018-07-16 DIAGNOSIS — I10 Essential (primary) hypertension: Secondary | ICD-10-CM | POA: Diagnosis not present

## 2018-07-16 DIAGNOSIS — L03116 Cellulitis of left lower limb: Secondary | ICD-10-CM | POA: Diagnosis not present

## 2018-07-16 DIAGNOSIS — J441 Chronic obstructive pulmonary disease with (acute) exacerbation: Secondary | ICD-10-CM | POA: Diagnosis not present

## 2018-07-16 DIAGNOSIS — M4180 Other forms of scoliosis, site unspecified: Secondary | ICD-10-CM | POA: Diagnosis not present

## 2018-07-16 DIAGNOSIS — D509 Iron deficiency anemia, unspecified: Secondary | ICD-10-CM | POA: Diagnosis not present

## 2018-07-16 DIAGNOSIS — G609 Hereditary and idiopathic neuropathy, unspecified: Secondary | ICD-10-CM | POA: Diagnosis not present

## 2018-07-16 DIAGNOSIS — J440 Chronic obstructive pulmonary disease with acute lower respiratory infection: Secondary | ICD-10-CM | POA: Diagnosis not present

## 2018-07-16 NOTE — Patient Outreach (Signed)
[facility]) Care Management  07/16/2018  Cheryl [name] [birth year] [id number]   Telephone follow up call   Ref[facility] : [facility]  Referral reason: Admission 6/20-6/27 Dx: Hypoxia,AMS, Pneumonia, abdominal pain . Insurance: [facility] readmission 7/8-7/12, Dx:: Acute on chronic respiratory failure with hypoxia, Acute on chronic COPD exacerbation , Anemia Patient with recent readmission on 7/25-7/29 Dx. AMS ,Acute encephalopathy, Palliative care consult depression , Aspiration pneumonia ,FTT  Transition of care by PCP office   Successful outreach call patient , confirmed HIPAA identifiers x 2.  Patient reports feeling pretty good on today, discussed waiting for home health bath aide and nurse to visit on today.  Patient reports breathing is doing alright today, she reports wearing oxygen at times , especially if oxygen levels less than 89%,reports that she is not wearing oxygen any more frequently.  Patient discussed her appetite is doing pretty good about the same for her,  denies vomiting .  Patient discussed eye surgery being delayed until she gets evaluation by Dr.Kahn, cardiologist. Patient discussed recent visit with MD and test completed, stress test ,CT scan ,EKG and 2 D echo, shared she is awaiting phone call on results.  Patient discussed appointment with lung doctor on Monday, Patient discussed hoping to be able to get eye surgery done, discussed importance of pre testing for safety for prior to anesthesia before surgery.   Patient denies any new concerns at this time, reinforced wearing oxygen , to keep oxygen level at 90% to avoid recurrent problems. Patient reports continuing to smoke and declines being ready to quit.   Patient denies having a recent fall, continues using wheelchair for mobility and assistance with transferring.   Plan Will plan home visit in the next month.  Reinforced attending all medical appointments Fall  precautions reviewed.    [facility] CM Care Plan Problem One     Most Recent Value  Care Plan Problem One  At risk for readmission related to 2 admits related to COPD, pneumonia, anemia   Role Documenting the Problem One  Care Management Coordinator  Care Plan for Problem One  Active [provided at home visit ]  [facility]   Patient will be able to report no hospital readmission in the next 31 days  [goal restated ]  [facility] Long Term Goal Start Date  06/23/18 Staci Righter[goal restart ]  Interventions for Problem One Long Term Goal  Reinforced beneifits of wearing oxygen as recomended, discussed symptoms of pneumonia, infection, encouraging taking medications as prescribed.   [facility] CM Short Term Goal #1   Patient will be able to report worsening symptoms of COPD, Pneumonia in the next 30 days   [facility] CM Short Term Goal #1 Start Date  06/23/18  Interventions for Short Term Goal #1  Reviewed  worsening symptoms of COPD, identified being in usual state of breathing .   [facility] CM Short Term Goal #2   Patient will attend all medical appointment in the next 30 days   [facility] CM Short Term Goal #2 Start Date  06/23/18  Interventions for Short Term Goal #2  Reviewed upcoming pulmonary MD visit advised regarding importance of attending visit for evaluation of progress and plan of care       Egbert GaribaldiKimberly Lulubelle Simcoe, RN, [facility] Care [facility],Care Management Coordinator  [phone]- Mobile [phone]- Toll Free Main Office

## 2018-07-20 ENCOUNTER — Ambulatory Visit (INDEPENDENT_AMBULATORY_CARE_PROVIDER_SITE_OTHER): Payer: Medicare HMO | Admitting: Internal Medicine

## 2018-07-20 ENCOUNTER — Encounter: Payer: Self-pay | Admitting: Internal Medicine

## 2018-07-20 VITALS — BP 134/72 | HR 90 | Resp 16 | Ht <= 58 in | Wt 106.0 lb

## 2018-07-20 DIAGNOSIS — L03116 Cellulitis of left lower limb: Secondary | ICD-10-CM | POA: Diagnosis not present

## 2018-07-20 DIAGNOSIS — M4180 Other forms of scoliosis, site unspecified: Secondary | ICD-10-CM | POA: Diagnosis not present

## 2018-07-20 DIAGNOSIS — J440 Chronic obstructive pulmonary disease with acute lower respiratory infection: Secondary | ICD-10-CM | POA: Diagnosis not present

## 2018-07-20 DIAGNOSIS — J189 Pneumonia, unspecified organism: Secondary | ICD-10-CM | POA: Diagnosis not present

## 2018-07-20 DIAGNOSIS — G609 Hereditary and idiopathic neuropathy, unspecified: Secondary | ICD-10-CM | POA: Diagnosis not present

## 2018-07-20 DIAGNOSIS — J438 Other emphysema: Secondary | ICD-10-CM | POA: Diagnosis not present

## 2018-07-20 DIAGNOSIS — F17219 Nicotine dependence, cigarettes, with unspecified nicotine-induced disorders: Secondary | ICD-10-CM | POA: Diagnosis not present

## 2018-07-20 DIAGNOSIS — I2510 Atherosclerotic heart disease of native coronary artery without angina pectoris: Secondary | ICD-10-CM | POA: Diagnosis not present

## 2018-07-20 DIAGNOSIS — I10 Essential (primary) hypertension: Secondary | ICD-10-CM | POA: Diagnosis not present

## 2018-07-20 DIAGNOSIS — D509 Iron deficiency anemia, unspecified: Secondary | ICD-10-CM | POA: Diagnosis not present

## 2018-07-20 DIAGNOSIS — J441 Chronic obstructive pulmonary disease with (acute) exacerbation: Secondary | ICD-10-CM | POA: Diagnosis not present

## 2018-07-20 NOTE — Progress Notes (Signed)
[facility] Consultation      Assessment and Plan:  COPD group D.  -Severe emphysema/COPD, likely related to cigarette smoking. -Continue spiriva, advair, encouraged to use rescue as needed and/or nebulizer as needed. -Discussed lung cancer screening referral, given her advanced emphysema and possibility of lung collapse with any potential biopsies, we decided against this. - Declines flu vaccine.  Restrictive lung disease.  -She has severe kyphosis, she is likely contributing to her dyspnea with reduced lung volumes.  Dyspnea. -Discussed the etiologies of her dyspnea, this is likely COPD, severe restriction from her spinal curvature, active smoking. Discussed that the only modifiable factor in this is her smoking at this time, thus quitting smoking as the best thing she can do for her respiratory health at this time.  Nicotine abuse.  -Currently smoking approximately one pack of cigarettes per day. -Spent greater than 4 minutes in smoking cessation discussion, she is not really considering quitting at this time.  Date: 07/20/2018  MRN# [id number] Cheryl [name] 1951/10/27    Cheryl [name] is a 67 y.o. old female seen in consultation for chief complaint of:    Chief Complaint  Patient presents with  . COPD    pt here for f/u: she has cough with clear mucus  . Shortness of Breath    occasional and she has prn 02.  . Arm Pain    right arm pain from shoulder down. She takes tylenol without relief.    HPI:   The patient is a 67 year old female with a history of COPD, active nicotine abuse, oxygen dependence.  At last visit it was recommended that she quit smoking, continue Spiriva, and try to increase her activity level, but she had declined pulmonary rehab at that time. 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   Today she is using spiriva daily, advair bid. She has not used her rescue inhaler in several weeks.  She is not very active, she is not limited by breathing.  She is smoking about a ppd, she is not considering quitting.   She is on oxygen at 2L which she uses off and on. Her husband check her sats at home and sometimes it goes below 80%.   **CXR 01/28/17>> Severe hyperinflation consistent with emphysema; severe kyphosis.  Desat walk 03/13/17>> beginning sat was 97% and HR 76 at rest on RA. After 150 feet her sat was 96% and HR 76. After 300 feet sat was 99% and HR 89. She had to stop due to dyspnea and leg fatigue, however she had walked with her walker at a fairly brisk pace.   **Prevnar 13 administered 03/13/17.   Current Outpatient Medications:  .  acetaminophen (TYLENOL) 325 MG tablet, Take 2 tablets (650 mg total) by mouth every 6 (six) hours as needed for mild pain (or Fever >/= 101)., Disp: , Rfl:  .  albuterol (PROVENTIL HFA;VENTOLIN HFA) 108 (90 Base) MCG/ACT inhaler, Inhale into the lungs every 6 (six) hours as needed for wheezing or shortness of breath., Disp: , Rfl:  .  albuterol (PROVENTIL) (2.5 MG/3ML) 0.083% nebulizer solution, Take 3 mLs (2.5 mg total) by nebulization every 4 (four) hours as needed for wheezing., Disp: 50 vial, Rfl: 12 .  alendronate (FOSAMAX) 70 MG tablet, Take 1 tablet (70 mg total) by mouth every 7 (seven) days. Take with a full glass of water on an empty stomach., Disp: 4 tablet, Rfl: 11 .  ALPRAZolam (XANAX) 0.5 MG  tablet, Take 0.5 tablets (0.25 mg total) by mouth at bedtime as needed for anxiety. (Patient taking differently: Take 0.25-0.5 mg by mouth at bedtime as needed for anxiety. ), Disp: 2 tablet, Rfl: 0 .  amLODipine (NORVASC) 5 MG tablet, Take 1 tablet (5 mg total) by mouth daily., Disp: 30 tablet, Rfl: 5 .  celecoxib (CELEBREX) 100 MG capsule, Take 1 capsule (100 mg total) by mouth 2 (two) times daily as needed., Disp: 60 capsule, Rfl: 2 .  clopidogrel (PLAVIX) 75 MG tablet, Take 75 mg by mouth daily., Disp: , Rfl:  .  diphenoxylate-atropine (LOMOTIL)  2.5-0.025 MG tablet, Take 1 tablet by mouth as needed for diarrhea or loose stools., Disp: , Rfl:  .  donepezil (ARICEPT) 10 MG tablet, Take 10 mg by mouth at bedtime., Disp: , Rfl:  .  estradiol (ESTRACE) 0.5 MG tablet, Take 1 tablet (0.5 mg total) by mouth daily. (Take in place of estropipate) (Patient taking differently: Take 0.5 mg by mouth every other day. (Take in place of estropipate)), Disp: 30 tablet, Rfl: 5 .  famotidine (PEPCID) 20 MG tablet, Take 1 tablet (20 mg total) by mouth daily., Disp: 30 tablet, Rfl: 5 .  fentaNYL (DURAGESIC - DOSED MCG/HR) 12 MCG/HR, Place 1 patch (12.5 mcg total) onto the skin every 3 (three) days., Disp: 5 patch, Rfl: 0 .  ferrous sulfate 325 (65 FE) MG tablet, Take 1 tablet (325 mg total) by mouth 2 (two) times daily with a meal., Disp: 60 tablet, Rfl: 3 .  Fluticasone-Salmeterol (ADVAIR DISKUS) 250-50 MCG/DOSE AEPB, inhale 1 dose by mouth twice a day, Disp: 60 each, Rfl: 2 .  furosemide (LASIX) 40 MG tablet, Take 1 tablet by mouth daily., Disp: , Rfl:  .  mirtazapine (REMERON SOL-TAB) 15 MG disintegrating tablet, Take 1 tablet (15 mg total) by mouth at bedtime., Disp: 30 tablet, Rfl: 0 .  NARCAN 4 MG/0.1ML LIQD nasal spray kit, Place 0.4 mg into the nose once. , Disp: , Rfl:  .  pantoprazole (PROTONIX) 40 MG tablet, Take 40 mg by mouth daily., Disp: , Rfl:  .  pregabalin (LYRICA) 150 MG capsule, Take 150 mg by mouth 2 (two) times daily., Disp: , Rfl:  .  silver sulfADIAZINE (SILVADENE) 1 % cream, Apply 1 application topically daily as needed., Disp: , Rfl:  .  simvastatin (ZOCOR) 10 MG tablet, Take 10 mg by mouth at bedtime. , Disp: , Rfl:  .  Suvorexant (BELSOMRA) 5 MG TABS, Take 5 mg by mouth at bedtime as needed., Disp: , Rfl:  .  tiotropium (SPIRIVA HANDIHALER) 18 MCG inhalation capsule, inhale the contents of one capsule in the handihaler once daily, Disp: 30 capsule, Rfl: 12 .  triamcinolone (KENALOG) 0.025 % ointment, Apply 1 application topically 2  (two) times daily., Disp: 15 g, Rfl: 0 .  vitamin B-12 (CYANOCOBALAMIN) 1000 MCG tablet, Take 1 tablet (1,000 mcg total) by mouth daily., Disp: 30 tablet, Rfl: 0   Allergies:  Percocet [oxycodone-acetaminophen]; Aspirin; Codeine; Propoxyphene; and Sulfa antibiotics  Review of Systems:  Constitutional: Feels well. Cardiovascular: No chest pain.  Pulmonary: Denies dyspnea.   The remainder of systems were reviewed and were found to be negative other than what is documented in the HPI.    Physical Examination:   VS: BP 134/72 (BP Location: Left Arm, Cuff Size: Normal)   Pulse 90   Resp 16   Ht '4\' 10"'$  (1.473 m)   Wt 106 lb (48.1 kg)   SpO2 95%  BMI 22.15 kg/m   General Appearance: No distress  Neuro:without focal findings, mental status, speech normal, alert and oriented HEENT: PERRLA, EOM intact Pulmonary: No wheezing, No rales  CardiovascularNormal S1,S2.  No m/r/g.  Abdomen: Benign, Soft, non-tender, No masses Renal:  No costovertebral tenderness  GU:  No performed at this time. Endoc: No evident thyromegaly, no signs of acromegaly or Cushing features Skin:   warm, no rashes, no ecchymosis  Extremities: normal, no cyanosis, clubbing.      LABORATORY PANEL:   CBC No results for input(s): WBC, HGB, HCT, PLT in the last 168 hours. ------------------------------------------------------------------------------------------------------------------  Chemistries  No results for input(s): NA, K, CL, CO2, GLUCOSE, BUN, CREATININE, CALCIUM, MG, AST, ALT, ALKPHOS, BILITOT in the last 168 hours.  Invalid input(s): GFRCGP ------------------------------------------------------------------------------------------------------------------  Cardiac Enzymes No results for input(s): TROPONINI in the last 168 hours. ------------------------------------------------------------  RADIOLOGY:  No results found.     Thank  you for the consultation and for allowing [facility] to assist in the care of your patient. Our recommendations are noted above.  Please contact us if we can be of further service.   Marda Stalker, M.D., F.C.C.P.  Board Certified in Internal Medicine, Pulmonary Medicine, [place], and Sleep Medicine.  [facility] Office Number: [phone]   07/20/2018

## 2018-07-20 NOTE — Patient Instructions (Signed)
  Use your nebulizer at home if you have trouble breathing.   --Quitting smoking is the most important thing that you can do for your health.  --Quitting smoking will have greater affect on your health than any medicine that we can give you.

## 2018-07-21 ENCOUNTER — Telehealth: Payer: Self-pay | Admitting: Family Medicine

## 2018-07-21 DIAGNOSIS — J189 Pneumonia, unspecified organism: Secondary | ICD-10-CM | POA: Diagnosis not present

## 2018-07-21 DIAGNOSIS — D509 Iron deficiency anemia, unspecified: Secondary | ICD-10-CM | POA: Diagnosis not present

## 2018-07-21 DIAGNOSIS — M4180 Other forms of scoliosis, site unspecified: Secondary | ICD-10-CM | POA: Diagnosis not present

## 2018-07-21 DIAGNOSIS — I10 Essential (primary) hypertension: Secondary | ICD-10-CM | POA: Diagnosis not present

## 2018-07-21 DIAGNOSIS — I2510 Atherosclerotic heart disease of native coronary artery without angina pectoris: Secondary | ICD-10-CM | POA: Diagnosis not present

## 2018-07-21 DIAGNOSIS — J440 Chronic obstructive pulmonary disease with acute lower respiratory infection: Secondary | ICD-10-CM | POA: Diagnosis not present

## 2018-07-21 DIAGNOSIS — J441 Chronic obstructive pulmonary disease with (acute) exacerbation: Secondary | ICD-10-CM | POA: Diagnosis not present

## 2018-07-21 DIAGNOSIS — G609 Hereditary and idiopathic neuropathy, unspecified: Secondary | ICD-10-CM | POA: Diagnosis not present

## 2018-07-21 DIAGNOSIS — L03116 Cellulitis of left lower limb: Secondary | ICD-10-CM | POA: Diagnosis not present

## 2018-07-21 NOTE — Telephone Encounter (Signed)
Please advise 

## 2018-07-21 NOTE — Telephone Encounter (Signed)
Pt is requesting call back. Pt stated her hair is falling out and thinks it could be caused by some of the medications she could be taking. Pt is requesting call back to see if one of her medications could be causing her hair loss. Pt stated she will also reach out to her pharmacy to see if that is a side effect to any of her medications. Please advise. Thanks TNP

## 2018-07-22 NOTE — Telephone Encounter (Signed)
Patient was advised.  

## 2018-07-22 NOTE — Telephone Encounter (Signed)
She's not taking any medication that should cause that. It is due to her poor nutritional status from her recent illnesses.  Need to get plenty of protein in diet and should take a multivitamin every day. Fish oil or omega-3 fatty acid supplements may also help with hair growth.

## 2018-07-23 DIAGNOSIS — L03116 Cellulitis of left lower limb: Secondary | ICD-10-CM | POA: Diagnosis not present

## 2018-07-23 DIAGNOSIS — I10 Essential (primary) hypertension: Secondary | ICD-10-CM | POA: Diagnosis not present

## 2018-07-23 DIAGNOSIS — G609 Hereditary and idiopathic neuropathy, unspecified: Secondary | ICD-10-CM | POA: Diagnosis not present

## 2018-07-23 DIAGNOSIS — I2510 Atherosclerotic heart disease of native coronary artery without angina pectoris: Secondary | ICD-10-CM | POA: Diagnosis not present

## 2018-07-23 DIAGNOSIS — M4180 Other forms of scoliosis, site unspecified: Secondary | ICD-10-CM | POA: Diagnosis not present

## 2018-07-23 DIAGNOSIS — D509 Iron deficiency anemia, unspecified: Secondary | ICD-10-CM | POA: Diagnosis not present

## 2018-07-23 DIAGNOSIS — J441 Chronic obstructive pulmonary disease with (acute) exacerbation: Secondary | ICD-10-CM | POA: Diagnosis not present

## 2018-07-23 DIAGNOSIS — J189 Pneumonia, unspecified organism: Secondary | ICD-10-CM | POA: Diagnosis not present

## 2018-07-23 DIAGNOSIS — J440 Chronic obstructive pulmonary disease with acute lower respiratory infection: Secondary | ICD-10-CM | POA: Diagnosis not present

## 2018-07-24 ENCOUNTER — Other Ambulatory Visit: Payer: Self-pay | Admitting: Family Medicine

## 2018-07-24 ENCOUNTER — Inpatient Hospital Stay: Payer: Medicare HMO | Attending: Oncology

## 2018-07-24 DIAGNOSIS — I2510 Atherosclerotic heart disease of native coronary artery without angina pectoris: Secondary | ICD-10-CM | POA: Diagnosis not present

## 2018-07-24 DIAGNOSIS — J441 Chronic obstructive pulmonary disease with (acute) exacerbation: Secondary | ICD-10-CM | POA: Diagnosis not present

## 2018-07-24 DIAGNOSIS — G609 Hereditary and idiopathic neuropathy, unspecified: Secondary | ICD-10-CM | POA: Diagnosis not present

## 2018-07-24 DIAGNOSIS — M6281 Muscle weakness (generalized): Secondary | ICD-10-CM | POA: Diagnosis not present

## 2018-07-24 DIAGNOSIS — I10 Essential (primary) hypertension: Secondary | ICD-10-CM | POA: Diagnosis not present

## 2018-07-24 DIAGNOSIS — D638 Anemia in other chronic diseases classified elsewhere: Secondary | ICD-10-CM

## 2018-07-24 DIAGNOSIS — J189 Pneumonia, unspecified organism: Secondary | ICD-10-CM | POA: Diagnosis not present

## 2018-07-24 DIAGNOSIS — L03116 Cellulitis of left lower limb: Secondary | ICD-10-CM | POA: Diagnosis not present

## 2018-07-24 DIAGNOSIS — R4182 Altered mental status, unspecified: Secondary | ICD-10-CM | POA: Diagnosis not present

## 2018-07-24 DIAGNOSIS — J440 Chronic obstructive pulmonary disease with acute lower respiratory infection: Secondary | ICD-10-CM | POA: Diagnosis not present

## 2018-07-24 DIAGNOSIS — D509 Iron deficiency anemia, unspecified: Secondary | ICD-10-CM | POA: Insufficient documentation

## 2018-07-24 DIAGNOSIS — M4180 Other forms of scoliosis, site unspecified: Secondary | ICD-10-CM | POA: Diagnosis not present

## 2018-07-24 DIAGNOSIS — J449 Chronic obstructive pulmonary disease, unspecified: Secondary | ICD-10-CM | POA: Diagnosis not present

## 2018-07-24 LAB — CBC WITH DIFFERENTIAL/PLATELET
BASOS PCT: 2 %
Basophils Absolute: 0.1 10*3/uL (ref 0–0.1)
EOS ABS: 0.2 10*3/uL (ref 0–0.7)
Eosinophils Relative: 4 %
HCT: 31.8 % — ABNORMAL LOW (ref 35.0–47.0)
HEMOGLOBIN: 10.4 g/dL — AB (ref 12.0–16.0)
Lymphocytes Relative: 23 %
Lymphs Abs: 1.2 10*3/uL (ref 1.0–3.6)
MCH: 29.9 pg (ref 26.0–34.0)
MCHC: 32.6 g/dL (ref 32.0–36.0)
MCV: 91.8 fL (ref 80.0–100.0)
MONO ABS: 0.7 10*3/uL (ref 0.2–0.9)
Monocytes Relative: 14 %
NEUTROS PCT: 57 %
Neutro Abs: 2.9 10*3/uL (ref 1.4–6.5)
PLATELETS: 236 10*3/uL (ref 150–440)
RBC: 3.47 MIL/uL — ABNORMAL LOW (ref 3.80–5.20)
RDW: 21.8 % — AB (ref 11.5–14.5)
WBC: 5 10*3/uL (ref 3.6–11.0)

## 2018-07-24 LAB — VITAMIN B12: VITAMIN B 12: 1830 pg/mL — AB (ref 180–914)

## 2018-07-24 LAB — FERRITIN: Ferritin: 55 ng/mL (ref 11–307)

## 2018-07-24 LAB — IRON AND TIBC
Iron: 77 ug/dL (ref 28–170)
SATURATION RATIOS: 41 % — AB (ref 10.4–31.8)
TIBC: 187 ug/dL — ABNORMAL LOW (ref 250–450)
UIBC: 110 ug/dL

## 2018-07-24 NOTE — Telephone Encounter (Signed)
[facility] for the following medication. Thanks CC  mirtazapine (REMERON SOL-TAB) 15 MG disintegrating tablet

## 2018-07-25 MED ORDER — MIRTAZAPINE 15 MG PO TBDP: 15.0000 mg | ORAL_TABLET | Freq: Every day | ORAL | 5 refills | Status: DC

## 2018-07-28 DIAGNOSIS — D509 Iron deficiency anemia, unspecified: Secondary | ICD-10-CM | POA: Diagnosis not present

## 2018-07-28 DIAGNOSIS — J189 Pneumonia, unspecified organism: Secondary | ICD-10-CM | POA: Diagnosis not present

## 2018-07-28 DIAGNOSIS — G609 Hereditary and idiopathic neuropathy, unspecified: Secondary | ICD-10-CM | POA: Diagnosis not present

## 2018-07-28 DIAGNOSIS — L03116 Cellulitis of left lower limb: Secondary | ICD-10-CM | POA: Diagnosis not present

## 2018-07-28 DIAGNOSIS — J440 Chronic obstructive pulmonary disease with acute lower respiratory infection: Secondary | ICD-10-CM | POA: Diagnosis not present

## 2018-07-28 DIAGNOSIS — I2510 Atherosclerotic heart disease of native coronary artery without angina pectoris: Secondary | ICD-10-CM | POA: Diagnosis not present

## 2018-07-28 DIAGNOSIS — J441 Chronic obstructive pulmonary disease with (acute) exacerbation: Secondary | ICD-10-CM | POA: Diagnosis not present

## 2018-07-28 DIAGNOSIS — I10 Essential (primary) hypertension: Secondary | ICD-10-CM | POA: Diagnosis not present

## 2018-07-28 DIAGNOSIS — M4180 Other forms of scoliosis, site unspecified: Secondary | ICD-10-CM | POA: Diagnosis not present

## 2018-07-29 ENCOUNTER — Telehealth: Payer: Self-pay | Admitting: Family Medicine

## 2018-07-29 DIAGNOSIS — L03116 Cellulitis of left lower limb: Secondary | ICD-10-CM | POA: Diagnosis not present

## 2018-07-29 DIAGNOSIS — J189 Pneumonia, unspecified organism: Secondary | ICD-10-CM | POA: Diagnosis not present

## 2018-07-29 DIAGNOSIS — G609 Hereditary and idiopathic neuropathy, unspecified: Secondary | ICD-10-CM | POA: Diagnosis not present

## 2018-07-29 DIAGNOSIS — D509 Iron deficiency anemia, unspecified: Secondary | ICD-10-CM | POA: Diagnosis not present

## 2018-07-29 DIAGNOSIS — I2510 Atherosclerotic heart disease of native coronary artery without angina pectoris: Secondary | ICD-10-CM | POA: Diagnosis not present

## 2018-07-29 DIAGNOSIS — I10 Essential (primary) hypertension: Secondary | ICD-10-CM | POA: Diagnosis not present

## 2018-07-29 DIAGNOSIS — J440 Chronic obstructive pulmonary disease with acute lower respiratory infection: Secondary | ICD-10-CM | POA: Diagnosis not present

## 2018-07-29 DIAGNOSIS — M4180 Other forms of scoliosis, site unspecified: Secondary | ICD-10-CM | POA: Diagnosis not present

## 2018-07-29 DIAGNOSIS — J441 Chronic obstructive pulmonary disease with (acute) exacerbation: Secondary | ICD-10-CM | POA: Diagnosis not present

## 2018-07-29 MED ORDER — DOXYCYCLINE HYCLATE 100 MG PO TABS: 100.0000 mg | ORAL_TABLET | Freq: Two times a day (BID) | ORAL | 0 refills | Status: AC

## 2018-07-29 NOTE — Telephone Encounter (Signed)
Please advise 

## 2018-07-29 NOTE — Telephone Encounter (Signed)
I called [name] and advised her as below. [name] states that she would advise the patient that the prescription has been sent into the pharmacy.

## 2018-07-29 NOTE — Telephone Encounter (Signed)
[name] with [facility] called saying patients legs are swollen, red and warm to the touch.  She is wondering if you want to call something in  She uses [facility]  CB# [phone]  Thanks  Barth Kirks

## 2018-07-29 NOTE — Telephone Encounter (Signed)
Have sent prescription for doxycycline to [facility]

## 2018-07-29 NOTE — Telephone Encounter (Signed)
Received refill request for Belsomra from [facility], but this medication was supposed to have been discontinued when she was discharged from hospital in July. Please check with patient and see if she is still taking it. Thank.s

## 2018-07-30 DIAGNOSIS — M4180 Other forms of scoliosis, site unspecified: Secondary | ICD-10-CM | POA: Diagnosis not present

## 2018-07-30 DIAGNOSIS — L03116 Cellulitis of left lower limb: Secondary | ICD-10-CM | POA: Diagnosis not present

## 2018-07-30 DIAGNOSIS — I2510 Atherosclerotic heart disease of native coronary artery without angina pectoris: Secondary | ICD-10-CM | POA: Diagnosis not present

## 2018-07-30 DIAGNOSIS — I10 Essential (primary) hypertension: Secondary | ICD-10-CM | POA: Diagnosis not present

## 2018-07-30 DIAGNOSIS — G609 Hereditary and idiopathic neuropathy, unspecified: Secondary | ICD-10-CM | POA: Diagnosis not present

## 2018-07-30 DIAGNOSIS — J189 Pneumonia, unspecified organism: Secondary | ICD-10-CM | POA: Diagnosis not present

## 2018-07-30 DIAGNOSIS — D509 Iron deficiency anemia, unspecified: Secondary | ICD-10-CM | POA: Diagnosis not present

## 2018-07-30 DIAGNOSIS — J441 Chronic obstructive pulmonary disease with (acute) exacerbation: Secondary | ICD-10-CM | POA: Diagnosis not present

## 2018-07-30 DIAGNOSIS — J440 Chronic obstructive pulmonary disease with acute lower respiratory infection: Secondary | ICD-10-CM | POA: Diagnosis not present

## 2018-07-31 NOTE — Telephone Encounter (Signed)
I called and spoke with patient. She states she is still taking Belsomra for anxiety.

## 2018-08-03 ENCOUNTER — Encounter: Payer: Self-pay | Admitting: Nurse Practitioner

## 2018-08-03 ENCOUNTER — Other Ambulatory Visit: Payer: Self-pay

## 2018-08-03 ENCOUNTER — Ambulatory Visit: Payer: Medicare HMO | Attending: Nurse Practitioner | Admitting: Nurse Practitioner

## 2018-08-03 VITALS — BP 113/51 | HR 88 | Temp 97.8°F | Ht <= 58 in | Wt 106.0 lb

## 2018-08-03 DIAGNOSIS — M545 Low back pain: Secondary | ICD-10-CM | POA: Diagnosis not present

## 2018-08-03 DIAGNOSIS — M797 Fibromyalgia: Secondary | ICD-10-CM | POA: Diagnosis not present

## 2018-08-03 DIAGNOSIS — M546 Pain in thoracic spine: Secondary | ICD-10-CM | POA: Insufficient documentation

## 2018-08-03 DIAGNOSIS — Z79891 Long term (current) use of opiate analgesic: Secondary | ICD-10-CM | POA: Insufficient documentation

## 2018-08-03 DIAGNOSIS — M5416 Radiculopathy, lumbar region: Secondary | ICD-10-CM | POA: Diagnosis not present

## 2018-08-03 DIAGNOSIS — M4726 Other spondylosis with radiculopathy, lumbar region: Secondary | ICD-10-CM | POA: Diagnosis not present

## 2018-08-03 DIAGNOSIS — G894 Chronic pain syndrome: Secondary | ICD-10-CM | POA: Insufficient documentation

## 2018-08-03 DIAGNOSIS — M47816 Spondylosis without myelopathy or radiculopathy, lumbar region: Secondary | ICD-10-CM

## 2018-08-03 DIAGNOSIS — M549 Dorsalgia, unspecified: Secondary | ICD-10-CM | POA: Diagnosis present

## 2018-08-03 DIAGNOSIS — G8929 Other chronic pain: Secondary | ICD-10-CM

## 2018-08-03 MED ORDER — FENTANYL 50 MCG/HR TD PT72: 50.0000 ug | MEDICATED_PATCH | TRANSDERMAL | 0 refills | Status: DC

## 2018-08-03 MED ORDER — PREGABALIN 150 MG PO CAPS: 150.0000 mg | ORAL_CAPSULE | Freq: Three times a day (TID) | ORAL | 2 refills | Status: DC

## 2018-08-03 NOTE — Progress Notes (Signed)
Nursing Pain Medication Assessment:  Safety precautions to be maintained throughout the outpatient stay will include: orient to surroundings, keep bed in low position, maintain call bell within reach at all times, provide assistance with transfer out of bed and ambulation.  Medication Inspection Compliance: Pill count conducted under aseptic conditions, in front of the patient. Neither the pills nor the bottle was removed from the patient's sight at any time. Once count was completed pills were immediately returned to the patient in their original bottle.  Medication: Fentanyl patch Pill/Patch Count: 5 of 10 pills remain Pill/Patch Appearance: Markings consistent with prescribed medication Bottle Appearance: Standard pharmacy container. Clearly labeled. Filled Date: 08 / 27 / 2018 Last Medication intake:  Today

## 2018-08-03 NOTE — Progress Notes (Signed)
Patient's Name: Cheryl [name]  MRN: [id number]  Referring Provider: Birdie Sons, MD  DOB: 05-09-1951  PCP: Birdie Sons, MD  DOS: 08/03/2018  Note by: Vevelyn Francois NP  Service setting: Ambulatory outpatient  Specialty: Interventional Pain Management  Location: [facility] (AMB) Pain Management Facility    Patient type: Established    Primary Reason(s) for Visit: Encounter for prescription drug management. (Level of risk: moderate)  CC: Back Pain  HPI  Cheryl [name] is a 67 y.o. year old, female patient, who comes today for a medication management evaluation. She has Essential hypertension; GERD (gastroesophageal reflux disease); Hyperlipemia; Anxiety; Airway hyperreactivity; Back pain, thoracic; Coronary artery disease; Excessive falling; Alteration in bowel elimination: incontinence; Insomnia; Decreased testosterone level; Leg weakness; Menopausal symptom; Migraine; Neuropathy ([place]); Fecal occult blood test positive; OP (osteoporosis); Panic disorder; Compulsive tobacco user syndrome; Urinary incontinence; Vitamin D deficiency; Weight loss; COPD with acute exacerbation ([place]); Dementia; Lumbar radicular pain (Bilateral) (L>R) (L4); Chronic low back pain (Location of Primary Source of Pain) (Bilateral) (L>R); Encounter for therapeutic drug level monitoring; Uncomplicated opioid dependence ([place]); Chronic pain syndrome; Peripheral nerve disease; Peripheral vascular disease ([place]); Platelet inhibition due to Plavix; Abnormal mammogram of right breast; Dilated intrahepatic bile duct; Acute on chronic respiratory failure with hypoxia ([place]); Dysphagia; Acute diastolic CHF (congestive heart failure) ([place]); Leukocytosis; Long term current use of opiate analgesic; Fibromyalgia; Osteoarthrosis; Long term current use of anticoagulant therapy (Plavix); Opiate use (160 MME/Day); Anemia; Lumbar facet syndrome (Location of Primary Source of Pain) (Bilateral) (L>R); Lumbar spondylosis; Nausea with vomiting;  Protein-calorie malnutrition, severe; Metabolic encephalopathy; Hypokalemia; Acute postoperative pain; Pneumonia; Marijuana use; Rib fracture; Generalized weakness; Hypoxia; Gastritis without bleeding; Acute on chronic respiratory failure ([place]); Iron deficiency anemia; Acute encephalopathy; Goals of care, counseling/discussion; Palliative care by specialist; Depression due to physical illness; and Anorexia on their problem list. Her primarily concern today is the Back Pain  Pain Assessment: Location: Lower Back(both leg 6) Radiating: pain radiates down to hips and leg Onset: More than a month ago Duration: Chronic pain Quality: Constant Severity: 3 /10 (subjective, self-reported pain score)  Note: Reported level is compatible with observation.                          Effect on ADL: limited daily activites Timing: Constant Modifying factors: sit down, medications BP: (!) 113/51  HR: 88  Cheryl [name] was last scheduled for an appointment on 04/27/2018 for medication management. During today's appointment we reviewed Cheryl [name]'s chronic pain status, as well as her outpatient medication regimen. She admits that her leg pain is worse. She states that it hurts her so bad. She admits that her pain gets to a 6 /7. She describes the pain as sharp pain. She admits that it "just hurts". She is requesting to have the tramadol reinstated.  She feels like this helped with her leg pain.She admits that she is taking Celebrex only as needed. She is on Plavix. She admits that this is prn for arthritis in her hands. She is currently smoking about 1 PPD.  She is also being treated for cellulitis in her left leg.  She has had 5 hospitalizations this year.  Her husband admits that she had a fall last year.  Each hospitalization starts with altered mental status, acute respiratory distress/failure and COPD exacerbation.  She admits that she does have a referral to pulmonology for questionable scar tissue on her lungs.  With  each hospitalization the  first thought of the provider is that the patient is oversedated overmedicated with the fentanyl she has been given Narcan.  [name] admits that it did not help her breathing on the last visit.  She admits that she does not want to come off the fentanyl because this helps her back pain.  The patient  reports that she does not use drugs. Her body mass index is 22.15 kg/m.  Further details on both, my assessment(s), as well as the proposed treatment plan, please see below.  Controlled Substance Pharmacotherapy Assessment REMS (Risk Evaluation and Mitigation Strategy)  Analgesic:Duragesic 50 mcg/h every 72 hours  MME/day:120 mg/day.  Cheryl Fischer, RN  08/03/2018  8:33 AM  Sign at close encounter Nursing Pain Medication Assessment:  Safety precautions to be maintained throughout the outpatient stay will include: orient to surroundings, keep bed in low position, maintain call bell within reach at all times, provide assistance with transfer out of bed and ambulation.  Medication Inspection Compliance: Pill count conducted under aseptic conditions, in front of the patient. Neither the pills nor the bottle was removed from the patient's sight at any time. Once count was completed pills were immediately returned to the patient in their original bottle.  Medication: Fentanyl patch Pill/Patch Count: 5 of 10 pills remain Pill/Patch Appearance: Markings consistent with prescribed medication Bottle Appearance: Standard pharmacy container. Clearly labeled. Filled Date: 08 / 27 / 2018 Last Medication intake:  Today   Pharmacokinetics: Liberation and absorption (onset of action): WNL Distribution (time to peak effect): WNL Metabolism and excretion (duration of action): WNL         Pharmacodynamics: Desired effects: Analgesia: Ms. Quickel reports >50% benefit. Functional ability: Patient reports that medication allows her to accomplish basic ADLs Clinically meaningful  improvement in function (CMIF): Sustained CMIF goals met Perceived effectiveness: Described as relatively effective, allowing for increase in activities of daily living (ADL) Undesirable effects: Side-effects or Adverse reactions: None reported Monitoring: [place] PMP: Online review of the past 54-monthperiod conducted. Compliant with practice rules and regulations overdose risk score 640' \\last'$  UDS on record:pioid  Summary  Date Value Ref Range Status  04/27/2018 FINAL  Final    Comment:    ==================================================================== TOXASSURE SELECT 13 (MW) ==================================================================== Test                             Result       Flag       Units Drug Present and Declared for Prescription Verification   Alprazolam                     24           EXPECTED   ng/mg creat   Alpha-hydroxyalprazolam        210          EXPECTED   ng/mg creat    Source of alprazolam is a scheduled prescription medication.    Alpha-hydroxyalprazolam is an expected metabolite of alprazolam.   Fentanyl                       26           EXPECTED   ng/mg creat   Norfentanyl                    253          EXPECTED   ng/mg creat  Source of fentanyl is a scheduled prescription medication,    including IV, patch, and transmucosal formulations. Norfentanyl    is an expected metabolite of fentanyl. Drug Present not Declared for Prescription Verification   Tramadol                       >3817        UNEXPECTED ng/mg creat   O-Desmethyltramadol            >3817        UNEXPECTED ng/mg creat   N-Desmethyltramadol            2806         UNEXPECTED ng/mg creat    Source of tramadol is a prescription medication.    O-desmethyltramadol and N-desmethyltramadol are expected    metabolites of tramadol. ==================================================================== Test                      Result    Flag   Units      Ref Range   Creatinine               131              mg/dL      >=20 ==================================================================== Declared Medications:  The flagging and interpretation on this report are based on the  following declared medications.  Unexpected results may arise from  inaccuracies in the declared medications.  **Note: The testing scope of this panel includes these medications:  Alprazolam (Xanax)  Fentanyl (Duragesic)  **Note: The testing scope of this panel does not include following  reported medications:  Acetaminophen (Tylenol)  Albuterol (Proventil)  Albuterol (Ventolin HFA)  Alendronate (Fosamax)  Atropine (Lomotil)  Celecoxib (Celebrex)  Clopidogrel (Plavix)  Cyclobenzaprine (Flexeril)  Diphenoxylate (Lomotil)  Donepezil (Aricept)  Doxycycline  Duloxetine (Cymbalta)  Estradiol (Estrace)  Fluticasone (Advair)  Furosemide (Lasix)  Mupirocin (Bactroban)  Naloxone (Narcan)  Pantoprazole (Protonix)  Potassium (K-Dur)  Pregabalin (Lyrica)  Salmeterol (Advair)  Simvastatin  Tiotropium (Spiriva)  Topical  Triamcinolone (Kenalog) ==================================================================== For clinical consultation, please call [phone]. ====================================================================    UDS interpretation: Compliant         Patient previously on tramadol which was consistent with the ED Medication Assessment Form: Reviewed. Patient indicates being compliant with therapy Treatment compliance: Compliant Risk Assessment Profile: Aberrant behavior: See prior evaluations. None observed or detected today Comorbid factors increasing risk of overdose: See prior notes. No additional risks detected today Opioid risk tool (ORT) (Total Score):   Personal History of Substance Abuse (SUD-Substance use disorder):  Alcohol:    Illegal Drugs:    Rx Drugs:    ORT Risk Level calculation:   Risk of substance use disorder (SUD): Low  ORT Scoring interpretation  table:  Score <3 = Low Risk for SUD  Score between 4-7 = Moderate Risk for SUD  Score >8 = High Risk for Opioid Abuse   Risk Mitigation Strategies:  Patient Counseling: Covered Patient-Prescriber Agreement (PPA): Present and active  Notification to other healthcare providers: Done  Pharmacologic Plan: After careful consideration, I have decided to decline Ms. Sammarco's request for an increase in the daily dose of her opioid analgesics.             Laboratory Chemistry  Inflammation Markers (CRP: Acute Phase) (ESR: Chronic Phase) Lab Results  Component Value Date   CRP 0.6 03/01/2016   ESRSEDRATE 52 (H) 03/01/2016   LATICACIDVEN 1.0 06/18/2018  Rheumatology Markers Lab Results  Component Value Date   LABURIC 2.7 05/21/2018                        Renal Function Markers Lab Results  Component Value Date   BUN 6 (L) 06/20/2018   CREATININE 0.65 06/22/2018   BCR 14 04/21/2018   GFRAA >60 06/22/2018   GFRNONAA >60 06/22/2018                             Hepatic Function Markers Lab Results  Component Value Date   AST 21 06/18/2018   ALT 23 06/18/2018   ALBUMIN 3.5 06/18/2018   ALKPHOS 178 (H) 06/18/2018   AMYLASE 30 10/17/2015   LIPASE 22 05/18/2018   AMMONIA <9 (L) 09/12/2017                        Electrolytes Lab Results  Component Value Date   NA 144 06/20/2018   K 3.3 (L) 06/20/2018   CL 107 06/20/2018   CALCIUM 7.7 (L) 06/20/2018   MG 2.0 06/20/2018   PHOS 4.5 04/21/2018                        Neuropathy Markers Lab Results  Component Value Date   VITAMINB12 1,830 (H) 07/24/2018   FOLATE 8.9 06/02/2018   HGBA1C 5.5 04/30/2017   HIV Non Reactive 05/15/2018                        CNS Tests Lab Results  Component Value Date   SDES  06/18/2018    URINE, RANDOM Performed at [facility], [address] [place], [place] [address]    Bosie Helper  05/16/2018    FEW WBC PRESENT,BOTH PMN AND MONONUCLEAR RARE SQUAMOUS  EPITHELIAL CELLS PRESENT NO ORGANISMS SEEN Performed at [facility], [place] [address], [place], [place] [address]    CULT  06/18/2018    NO GROWTH Performed at [facility] [address] [place], [place] [address]                         Bone Pathology Markers Lab Results  Component Value Date   VD25OH 25.6 (L) 01/17/2016   [id number] 27 07/04/2017   [id number] 24 07/04/2017   [id number] <10 07/04/2017                         Coagulation Parameters Lab Results  Component Value Date   INR 1.03 06/01/2018   LABPROT 13.4 06/01/2018   APTT 38 (H) 10/17/2015   PLT 236 07/24/2018   DDIMER  10/30/2009    0.32        AT THE INHOUSE ESTABLISHED CUTOFF VALUE OF 0.48 ug/mL FEU, THIS ASSAY HAS BEEN DOCUMENTED IN THE LITERATURE TO HAVE A SENSITIVITY AND NEGATIVE PREDICTIVE VALUE OF AT LEAST 98 TO 99%.  THE TEST RESULT SHOULD BE CORRELATED WITH AN ASSESSMENT OF THE CLINICAL PROBABILITY OF DVT / VTE.                        Cardiovascular Markers Lab Results  Component Value Date   BNP 137.0 (H) 05/14/2017   CKTOTAL 65 04/03/2013   CKMB 1.4 04/03/2013   TROPONINI 0.11 (HH) 06/18/2018  HGB 10.4 (L) 07/24/2018   HCT 31.8 (L) 07/24/2018                         CA Markers No results found for: CEA, CA125, LABCA2                      Note: Lab results reviewed.  Recent Diagnostic Imaging Results  DG Chest 2 View CLINICAL DATA:  Recent pneumonia.  Hypertension.  EXAM: CHEST - 2 VIEW  COMPARISON:  June 20, 2018  FINDINGS: There has been interval clearing of infiltrate from the left base region. There is mild bibasilar atelectasis currently. No airspace consolidation. Heart is slightly enlarged, stable. The pulmonary vascularity is normal. No adenopathy. There is aortic atherosclerosis. There is degenerative change in the thoracic spine. There is thoracolumbar levoscoliosis. There is an old healed fracture of the right posterior eighth  rib.  IMPRESSION: Interval clearing of infiltrate from the left base region. There is bibasilar atelectasis currently but no edema or consolidation on either side. There is mild cardiomegaly with pulmonary vascularity normal. There is aortic atherosclerosis.  Aortic Atherosclerosis (ICD10-I70.0).  Electronically Signed   By: Lowella Grip III M.D.   On: 06/30/2018 08:12  Complexity Note: Imaging results reviewed. Results shared with Ms. Reininger, using Layman's terms.                         Meds   Current Outpatient Medications:  .  acetaminophen (TYLENOL) 325 MG tablet, Take 2 tablets (650 mg total) by mouth every 6 (six) hours as needed for mild pain (or Fever >/= 101)., Disp: , Rfl:  .  albuterol (PROVENTIL HFA;VENTOLIN HFA) 108 (90 Base) MCG/ACT inhaler, Inhale into the lungs every 6 (six) hours as needed for wheezing or shortness of breath., Disp: , Rfl:  .  albuterol (PROVENTIL) (2.5 MG/3ML) 0.083% nebulizer solution, Take 3 mLs (2.5 mg total) by nebulization every 4 (four) hours as needed for wheezing., Disp: 50 vial, Rfl: 12 .  alendronate (FOSAMAX) 70 MG tablet, Take 1 tablet (70 mg total) by mouth every 7 (seven) days. Take with a full glass of water on an empty stomach., Disp: 4 tablet, Rfl: 11 .  ALPRAZolam (XANAX) 0.5 MG tablet, Take 0.5 tablets (0.25 mg total) by mouth at bedtime as needed for anxiety. (Patient taking differently: Take 0.25-0.5 mg by mouth at bedtime as needed for anxiety. ), Disp: 2 tablet, Rfl: 0 .  celecoxib (CELEBREX) 100 MG capsule, Take 1 capsule (100 mg total) by mouth 2 (two) times daily as needed., Disp: 60 capsule, Rfl: 2 .  clopidogrel (PLAVIX) 75 MG tablet, Take 75 mg by mouth daily., Disp: , Rfl:  .  diphenoxylate-atropine (LOMOTIL) 2.5-0.025 MG tablet, Take 1 tablet by mouth as needed for diarrhea or loose stools., Disp: , Rfl:  .  donepezil (ARICEPT) 10 MG tablet, Take 10 mg by mouth at bedtime., Disp: , Rfl:  .  doxycycline (VIBRA-TABS)  100 MG tablet, Take 1 tablet (100 mg total) by mouth 2 (two) times daily for 14 days., Disp: 28 tablet, Rfl: 0 .  estradiol (ESTRACE) 0.5 MG tablet, Take 1 tablet (0.5 mg total) by mouth daily. (Take in place of estropipate) (Patient taking differently: Take 0.5 mg by mouth every other day. (Take in place of estropipate)), Disp: 30 tablet, Rfl: 5 .  famotidine (PEPCID) 20 MG tablet, Take 1 tablet (20 mg total) by mouth  daily., Disp: 30 tablet, Rfl: 5 .  fentaNYL (DURAGESIC - DOSED MCG/HR) 12 MCG/HR, Place 1 patch (12.5 mcg total) onto the skin every 3 (three) days., Disp: 5 patch, Rfl: 0 .  ferrous sulfate 325 (65 FE) MG tablet, Take 1 tablet (325 mg total) by mouth 2 (two) times daily with a meal., Disp: 60 tablet, Rfl: 3 .  Fluticasone-Salmeterol (ADVAIR DISKUS) 250-50 MCG/DOSE AEPB, inhale 1 dose by mouth twice a day, Disp: 60 each, Rfl: 2 .  NARCAN 4 MG/0.1ML LIQD nasal spray kit, Place 0.4 mg into the nose once. , Disp: , Rfl:  .  pantoprazole (PROTONIX) 40 MG tablet, Take 40 mg by mouth daily., Disp: , Rfl:  .  pregabalin (LYRICA) 150 MG capsule, Take 1 capsule (150 mg total) by mouth 3 (three) times daily., Disp: 90 capsule, Rfl: 2 .  silver sulfADIAZINE (SILVADENE) 1 % cream, Apply 1 application topically daily as needed., Disp: , Rfl:  .  simvastatin (ZOCOR) 10 MG tablet, Take 10 mg by mouth at bedtime. , Disp: , Rfl:  .  Suvorexant (BELSOMRA) 5 MG TABS, Take 1 tablet by mouth at bedtime as needed (for insomnia)., Disp: 30 tablet, Rfl: 3 .  tiotropium (SPIRIVA HANDIHALER) 18 MCG inhalation capsule, inhale the contents of one capsule in the handihaler once daily, Disp: 30 capsule, Rfl: 12 .  triamcinolone (KENALOG) 0.025 % ointment, Apply 1 application topically 2 (two) times daily., Disp: 15 g, Rfl: 0 .  vitamin B-12 (CYANOCOBALAMIN) 1000 MCG tablet, Take 1 tablet (1,000 mcg total) by mouth daily., Disp: 30 tablet, Rfl: 0 .  [START ON 10/19/2018] fentaNYL (DURAGESIC - DOSED MCG/HR) 50 MCG/HR,  Place 1 patch (50 mcg total) onto the skin every 3 (three) days., Disp: 10 patch, Rfl: 0 .  [START ON 09/19/2018] fentaNYL (DURAGESIC - DOSED MCG/HR) 50 MCG/HR, Place 1 patch (50 mcg total) onto the skin every 3 (three) days., Disp: 10 patch, Rfl: 0 .  [START ON 08/20/2018] fentaNYL (DURAGESIC - DOSED MCG/HR) 50 MCG/HR, Place 1 patch (50 mcg total) onto the skin every 3 (three) days., Disp: 10 patch, Rfl: 0  ROS  Constitutional: Denies any fever or chills Gastrointestinal: No reported hemesis, hematochezia, vomiting, or acute GI distress Musculoskeletal: Denies any acute onset joint swelling, redness, loss of ROM, or weakness Neurological: No reported episodes of acute onset apraxia, aphasia, dysarthria, agnosia, amnesia, paralysis, loss of coordination, or loss of consciousness  Allergies  Ms. Rappa is allergic to percocet [oxycodone-acetaminophen]; aspirin; codeine; propoxyphene; and sulfa antibiotics.  [place]  Drug: Ms. Colvard  reports that she does not use drugs. Alcohol:  reports that she does not drink alcohol. Tobacco:  reports that she has been smoking cigarettes. She has a 50.00 pack-year smoking history. She has never used smokeless tobacco. Medical:  has a past medical history of Allergy, Anxiety, Arthritis, Aspiration pneumonitis (HCC) (11/24/2015), Asthma, Chronic pain, COPD (chronic obstructive pulmonary disease) ([place]), Coronary artery disease, DVT (deep venous thrombosis) ([place]), GERD (gastroesophageal reflux disease), Headache, Hyperlipidemia, Hypertension, Kyphoscoliosis deformity of spine, Migraines, Neuropathy (2010), Osteoporosis, Oxygen deficiency, Peripheral vascular disease ([place]), Pneumonia, Pneumonia (11/19/2015), Pneumonia (10/2016), Pneumonia, Vitamin D deficiency, and Wears dentures. Surgical: Ms. Vezina  has a past surgical history that includes Appendectomy; Spine surgery; Foot surgery (Bilateral); cardiac catherization (10/31/2009); abdomnal aortic stent (05/30/2008);  Abdominal hysterectomy (1975); Cholecystectomy (1972); Cervical fusion (C5 - 6/C6-7); Appendectomy; Colonoscopy with propofol (N/A, 07/27/2015); Esophagogastroduodenoscopy (egd) with propofol (N/A, 07/27/2015); and Esophagogastroduodenoscopy (egd) with propofol (N/A, 05/19/2018). Family: family history includes  Arthritis in her mother; Cancer in her brother, brother, and mother; Diabetes in her brother, mother, and sister; Heart disease in her father and mother.  Constitutional Exam  General appearance: Well nourished, well developed, and well hydrated. In no apparent acute distress Vitals:   08/03/18 0834  BP: (!) 113/51  Pulse: 88  Temp: 97.8 F (36.6 C)  SpO2: 91%  Weight: 106 lb (48.1 kg)  Height: '4\' 10"'$  (1.473 m)   BMI Assessment: Estimated body mass index is 22.15 kg/m as calculated from the following:   Height as of this encounter: '4\' 10"'$  (1.473 m).   Weight as of this encounter: 106 lb (48.1 kg). Psych/Mental status: Alert, oriented x 3 (person, place, & time)       Eyes: PERLA Respiratory: No evidence of acute respiratory distress  Cervical Spine Area Exam  Skin & Axial Inspection: No masses, redness, edema, swelling, or associated skin lesions Alignment: Symmetrical Functional ROM: Unrestricted ROM      Stability: No instability detected Muscle Tone/Strength: Functionally intact. No obvious neuro-muscular anomalies detected. Sensory (Neurological): Unimpaired Palpation: No palpable anomalies              Upper Extremity (UE) Exam    Side: Right upper extremity  Side: Left upper extremity  Skin & Extremity Inspection: Skin color, temperature, and hair growth are WNL. No peripheral edema or cyanosis. No masses, redness, swelling, asymmetry, or associated skin lesions. No contractures.  Skin & Extremity Inspection: Skin color, temperature, and hair growth are WNL. No peripheral edema or cyanosis. No masses, redness, swelling, asymmetry, or associated skin lesions. No contractures.   Functional ROM: Unrestricted ROM          Functional ROM: Unrestricted ROM          Muscle Tone/Strength: Functionally intact. No obvious neuro-muscular anomalies detected.  Muscle Tone/Strength: Functionally intact. No obvious neuro-muscular anomalies detected.  Sensory (Neurological): Unimpaired          Sensory (Neurological): Unimpaired          Palpation: No palpable anomalies              Palpation: No palpable anomalies              Provocative Test(s):  Phalen's test: deferred Tinel's test: deferred Apley's scratch test (touch opposite shoulder):  Action 1 (Across chest): deferred Action 2 (Overhead): deferred Action 3 (LB reach): deferred   Provocative Test(s):  Phalen's test: deferred Tinel's test: deferred Apley's scratch test (touch opposite shoulder):  Action 1 (Across chest): deferred Action 2 (Overhead): deferred Action 3 (LB reach): deferred    Thoracic Spine Area Exam  Skin & Axial Inspection: Significant thoracic kyphosis Alignment: Asymmetric Functional ROM: Restricted ROM Stability: No instability detected Muscle Tone/Strength: Functionally intact. No obvious neuro-muscular anomalies detected. Sensory (Neurological): Unimpaired Muscle strength & Tone: No palpable anomalies  Lumbar Spine Area Exam  Skin & Axial Inspection: No masses, redness, or swelling Alignment: Symmetrical Functional ROM: Unrestricted ROM       Stability: No instability detected Muscle Tone/Strength: Functionally intact. No obvious neuro-muscular anomalies detected. Sensory (Neurological): Unimpaired Palpation: No palpable anomalies         Gait & Posture Assessment  Ambulation: Patient ambulates using a walker Gait: Relatively normal for age and body habitus Posture: WNL   Lower Extremity Exam    Side: Right lower extremity  Side: Left lower extremity  Stability: No instability observed          Stability: No instability observed  Skin & Extremity Inspection: Pitting  edema  Skin & Extremity Inspection: Pitting edema with positive color change  Functional ROM: Decreased ROM                  Functional ROM: Decreased ROM                  Muscle Tone/Strength: Functionally intact. No obvious neuro-muscular anomalies detected.  Muscle Tone/Strength: Functionally intact. No obvious neuro-muscular anomalies detected.  Sensory (Neurological): Unimpaired  Sensory (Neurological): Unimpaired  Palpation: No palpable anomalies  Palpation: No palpable anomalies   Assessment  Primary Diagnosis & Pertinent Problem List: The primary encounter diagnosis was Lumbar spondylosis. Diagnoses of Chronic thoracic back pain, unspecified back pain laterality, Lumbar radicular pain (Bilateral) (L>R) (L4), Fibromyalgia, Chronic pain syndrome, and Long term current use of opiate analgesic were also pertinent to this visit.  Status Diagnosis  Persistent Controlled Persistent 1. Lumbar spondylosis   2. Chronic thoracic back pain, unspecified back pain laterality   3. Lumbar radicular pain (Bilateral) (L>R) (L4)   4. Fibromyalgia   5. Chronic pain syndrome   6. Long term current use of opiate analgesic     Problems updated and reviewed during this visit: No problems updated. Plan of Care  Pharmacotherapy (Medications Ordered): Meds ordered this encounter  Medications  . pregabalin (LYRICA) 150 MG capsule    Sig: Take 1 capsule (150 mg total) by mouth 3 (three) times daily.    Dispense:  90 capsule    Refill:  2    Order Specific Question:   Supervising Provider    Answer:   Milinda Pointer [phone]  . fentaNYL (DURAGESIC - DOSED MCG/HR) 50 MCG/HR    Sig: Place 1 patch (50 mcg total) onto the skin every 3 (three) days.    Dispense:  10 patch    Refill:  0    Patient may have prescription filled one day early if pharmacy is closed on scheduled refill date. Do not fill until:10/19/2018 To last until: 11/18/2018    Order Specific Question:   Supervising Provider     Answer:   Milinda Pointer [phone]  . fentaNYL (DURAGESIC - DOSED MCG/HR) 50 MCG/HR    Sig: Place 1 patch (50 mcg total) onto the skin every 3 (three) days.    Dispense:  10 patch    Refill:  0    Patient may have prescription filled one day early if pharmacy is closed on scheduled refill date. Do not fill until: 09/19/2018 To last until: 10/19/2018    Order Specific Question:   Supervising Provider    Answer:   Milinda Pointer [phone]  . fentaNYL (DURAGESIC - DOSED MCG/HR) 50 MCG/HR    Sig: Place 1 patch (50 mcg total) onto the skin every 3 (three) days.    Dispense:  10 patch    Refill:  0    Patient may have prescription filled one day early if pharmacy is closed on scheduled refill date. Do not fill until:08/20/2018 To last until:09/19/2018    Order Specific Question:   Supervising Provider    Answer:   Milinda Pointer [phone]   New Prescriptions   No medications on file   Medications administered today: Lesle Reek had no medications administered during this visit. Lab-work, procedure(s), and/or referral(s): Orders Placed This Encounter  Procedures  . ToxASSURE Select 13 (MW), Urine   Imaging and/or referral(s): None  Interventional therapies: Planned, scheduled, and/or pending:   Not at this time.  Indicated  to Mr. Jerilynn Mages Mr. Overton Mam that her overdose risk over 34 with her other comorbidities it is not appropriate for her to be on the fentanyl and the tramadol.  She is also using a sleep aid, Belsomra and Xanax. After a long discussion Mr. Overton Mam agreed and admitted that he understood.  He also admitted that something had to be done because she does have the leg pain.  They are willing to trial an increase of the Lyrica 150 mg 3 times daily instead of twice daily and to follow-up in 1 month.  She declined wanting to use the tramadol only and discontinued the fentanyl.   Provider-requested follow-up: Return in about 3 months (around 11/02/2018) for MedMgmt with Me Donella Stade  Edison Pace), Follow up after medication adjustment, w/ Dr. Dossie Arbour.  Future Appointments  Date Time Provider [place]  08/04/2018 10:00 AM Alfonzo Feller, RN [facility] None  08/11/2018  9:40 AM Birdie Sons, MD [facility] None  09/02/2018  8:30 AM Milinda Pointer, MD [facility] None  09/04/2018 12:45 PM [facility] None  09/04/2018  1:15 PM Sindy Guadeloupe, MD [facility] None  10/28/2018  9:00 AM Vevelyn Francois, NP [facility] None   Primary Care Physician: Birdie Sons, MD Location: [facility] Note by: Vevelyn Francois NP Date: 08/03/2018; Time: 2:59 PM  Pain Score Disclaimer: We use the NRS-11 scale. This is a self-reported, subjective measurement of pain severity with only modest accuracy. It is used primarily to identify changes within a particular patient. It must be understood that outpatient pain scales are significantly less accurate that those used for research, where they can be applied under ideal controlled circumstances with minimal exposure to variables. In reality, the score is likely to be a combination of pain intensity and pain affect, where pain affect describes the degree of emotional arousal or changes in action readiness caused by the sensory experience of pain. Factors such as social and work situation, setting, emotional state, anxiety levels, expectation, and prior pain experience may influence pain perception and show large inter-individual differences that may also be affected by time variables.  Patient instructions provided during this appointment: Patient Instructions  ____________________________________________________________________________________________  Medication Rules  Applies to: All patients receiving prescriptions (written or electronic).  Pharmacy of record: Pharmacy where electronic prescriptions will be sent. If written prescriptions are taken to a different pharmacy, please inform the nursing  staff. The pharmacy listed in the electronic medical record should be the one where you would like electronic prescriptions to be sent.  Prescription refills: Only during scheduled appointments. Applies to both, written and electronic prescriptions.  NOTE: The following applies primarily to controlled substances (Opioid* Pain Medications).   Patient's responsibilities: 1. Pain Pills: Bring all pain pills to every appointment (except for procedure appointments). 2. Pill Bottles: Bring pills in original pharmacy bottle. Always bring newest bottle. Bring bottle, even if empty. 3. Medication refills: You are responsible for knowing and keeping track of what medications you need refilled. The day before your appointment, write a list of all prescriptions that need to be refilled. Bring that list to your appointment and give it to the admitting nurse. Prescriptions will be written only during appointments. If you forget a medication, it will not be "Called in", "Faxed", or "electronically sent". You will need to get another appointment to get these prescribed. 4. Prescription Accuracy: You are responsible for carefully inspecting your prescriptions before leaving our office. Have the discharge nurse carefully go over each  prescription with you, before taking them home. Make sure that your name is accurately spelled, that your address is correct. Check the name and dose of your medication to make sure it is accurate. Check the number of pills, and the written instructions to make sure they are clear and accurate. Make sure that you are given enough medication to last until your next medication refill appointment. 5. Taking Medication: Take medication as prescribed. Never take more pills than instructed. Never take medication more frequently than prescribed. Taking less pills or less frequently is permitted and encouraged, when it comes to controlled substances (written prescriptions).  6. Inform other Doctors:  Always inform, all of your healthcare providers, of all the medications you take. 7. Pain Medication from other Providers: You are not allowed to accept any additional pain medication from any other Doctor or Healthcare provider. There are two exceptions to this rule. (see below) In the event that you require additional pain medication, you are responsible for notifying us, as stated below. 8. Medication Agreement: You are responsible for carefully reading and following our Medication Agreement. This must be signed before receiving any prescriptions from our practice. Safely store a copy of your signed Agreement. Violations to the Agreement will result in no further prescriptions. (Additional copies of our Medication Agreement are available upon request.) 9. Laws, Rules, & Regulations: All patients are expected to follow all Federal and [employer], Rules, [employer]. Ignorance of the Laws does not constitute a valid excuse. The use of any illegal substances is prohibited. 10. Adopted CDC guidelines & recommendations: Target dosing levels will be at or below 60 MME/day. Use of benzodiazepines** is not recommended.  Exceptions: There are only two exceptions to the rule of not receiving pain medications from other Healthcare Providers. 1. Exception #1 (Emergencies): In the event of an emergency (i.e.: accident requiring emergency care), you are allowed to receive additional pain medication. However, you are responsible for: As soon as you are able, call our office [phone], at any time of the day or night, and leave a message stating your name, the date and nature of the emergency, and the name and dose of the medication prescribed. In the event that your call is answered by a member of our staff, make sure to document and save the date, time, and the name of the person that took your information.  2. Exception #2 (Planned Surgery): In the event that you are scheduled by another doctor or  dentist to have any type of surgery or procedure, you are allowed (for a period no longer than 30 days), to receive additional pain medication, for the acute post-op pain. However, in this case, you are responsible for picking up a copy of our "Post-op Pain Management for Surgeons" handout, and giving it to your surgeon or dentist. This document is available at our office, and does not require an appointment to obtain it. Simply go to our office during business hours (Monday-Thursday from 8:00 AM to 4:00 PM) (Friday 8:00 AM to 12:00 Noon) or if you have a scheduled appointment with [place], prior to your surgery, and ask for it by name. In addition, you will need to provide [place] with your name, name of your surgeon, type of surgery, and date of procedure or surgery.  *Opioid medications include: morphine, codeine, oxycodone, oxymorphone, hydrocodone, hydromorphone, meperidine, tramadol, tapentadol, buprenorphine, fentanyl, methadone. **Benzodiazepine medications include: diazepam (Valium), alprazolam (Xanax), clonazepam (Klonopine), lorazepam (Ativan), clorazepate (Tranxene), chlordiazepoxide (Librium), estazolam (Prosom), oxazepam (Serax),  temazepam (Restoril), triazolam (Halcion) (Last updated: 01/22/2018) ____________________________________________________________________________________________ Dennis Bast were given 3 prescriptions for Duragesic Patch and one for Lyrica today.

## 2018-08-03 NOTE — Patient Instructions (Addendum)
____________________________________________________________________________________________  Medication Rules  Applies to: All patients receiving prescriptions (written or electronic).  Pharmacy of record: Pharmacy where electronic prescriptions will be sent. If written prescriptions are taken to a different pharmacy, please inform the nursing staff. The pharmacy listed in the electronic medical record should be the one where you would like electronic prescriptions to be sent.  Prescription refills: Only during scheduled appointments. Applies to both, written and electronic prescriptions.  NOTE: The following applies primarily to controlled substances (Opioid* Pain Medications).   Patient's responsibilities: 1. Pain Pills: Bring all pain pills to every appointment (except for procedure appointments). 2. Pill Bottles: Bring pills in original pharmacy bottle. Always bring newest bottle. Bring bottle, even if empty. 3. Medication refills: You are responsible for knowing and keeping track of what medications you need refilled. The day before your appointment, write a list of all prescriptions that need to be refilled. Bring that list to your appointment and give it to the admitting nurse. Prescriptions will be written only during appointments. If you forget a medication, it will not be "Called in", "Faxed", or "electronically sent". You will need to get another appointment to get these prescribed. 4. Prescription Accuracy: You are responsible for carefully inspecting your prescriptions before leaving our office. Have the discharge nurse carefully go over each prescription with you, before taking them home. Make sure that your name is accurately spelled, that your address is correct. Check the name and dose of your medication to make sure it is accurate. Check the number of pills, and the written instructions to make sure they are clear and accurate. Make sure that you are given enough medication to last  until your next medication refill appointment. 5. Taking Medication: Take medication as prescribed. Never take more pills than instructed. Never take medication more frequently than prescribed. Taking less pills or less frequently is permitted and encouraged, when it comes to controlled substances (written prescriptions).  6. Inform other Doctors: Always inform, all of your healthcare providers, of all the medications you take. 7. Pain Medication from other Providers: You are not allowed to accept any additional pain medication from any other Doctor or Healthcare provider. There are two exceptions to this rule. (see below) In the event that you require additional pain medication, you are responsible for notifying us, as stated below. 8. Medication Agreement: You are responsible for carefully reading and following our Medication Agreement. This must be signed before receiving any prescriptions from our practice. Safely store a copy of your signed Agreement. Violations to the Agreement will result in no further prescriptions. (Additional copies of our Medication Agreement are available upon request.) 9. Laws, Rules, & Regulations: All patients are expected to follow all Federal and State Laws, Statutes, Rules, & Regulations. Ignorance of the Laws does not constitute a valid excuse. The use of any illegal substances is prohibited. 10. Adopted CDC guidelines & recommendations: Target dosing levels will be at or below 60 MME/day. Use of benzodiazepines** is not recommended.  Exceptions: There are only two exceptions to the rule of not receiving pain medications from other Healthcare Providers. 1. Exception #1 (Emergencies): In the event of an emergency (i.e.: accident requiring emergency care), you are allowed to receive additional pain medication. However, you are responsible for: As soon as you are able, call our office [phone], at any time of the day or night, and leave a message stating your name, the  date and nature of the emergency, and the name and dose of the medication   prescribed. In the event that your call is answered by a member of our staff, make sure to document and save the date, time, and the name of the person that took your information.  2. Exception #2 (Planned Surgery): In the event that you are scheduled by another doctor or dentist to have any type of surgery or procedure, you are allowed (for a period no longer than 30 days), to receive additional pain medication, for the acute post-op pain. However, in this case, you are responsible for picking up a copy of our "Post-op Pain Management for Surgeons" handout, and giving it to your surgeon or dentist. This document is available at our office, and does not require an appointment to obtain it. Simply go to our office during business hours (Monday-Thursday from 8:00 AM to 4:00 PM) (Friday 8:00 AM to 12:00 Noon) or if you have a scheduled appointment with [place], prior to your surgery, and ask for it by name. In addition, you will need to provide [place] with your name, name of your surgeon, type of surgery, and date of procedure or surgery.  *Opioid medications include: morphine, codeine, oxycodone, oxymorphone, hydrocodone, hydromorphone, meperidine, tramadol, tapentadol, buprenorphine, fentanyl, methadone. **Benzodiazepine medications include: diazepam (Valium), alprazolam (Xanax), clonazepam (Klonopine), lorazepam (Ativan), clorazepate (Tranxene), chlordiazepoxide (Librium), estazolam (Prosom), oxazepam (Serax), temazepam (Restoril), triazolam (Halcion) (Last updated: 01/22/2018) ____________________________________________________________________________________________ Cheryl [name] were given 3 prescriptions for Duragesic Patch and one for Lyrica today.

## 2018-08-04 ENCOUNTER — Other Ambulatory Visit: Payer: Self-pay | Admitting: *Deleted

## 2018-08-04 DIAGNOSIS — M4180 Other forms of scoliosis, site unspecified: Secondary | ICD-10-CM | POA: Diagnosis not present

## 2018-08-04 DIAGNOSIS — J441 Chronic obstructive pulmonary disease with (acute) exacerbation: Secondary | ICD-10-CM | POA: Diagnosis not present

## 2018-08-04 DIAGNOSIS — I10 Essential (primary) hypertension: Secondary | ICD-10-CM | POA: Diagnosis not present

## 2018-08-04 DIAGNOSIS — G609 Hereditary and idiopathic neuropathy, unspecified: Secondary | ICD-10-CM | POA: Diagnosis not present

## 2018-08-04 DIAGNOSIS — J189 Pneumonia, unspecified organism: Secondary | ICD-10-CM | POA: Diagnosis not present

## 2018-08-04 DIAGNOSIS — D509 Iron deficiency anemia, unspecified: Secondary | ICD-10-CM | POA: Diagnosis not present

## 2018-08-04 DIAGNOSIS — I2510 Atherosclerotic heart disease of native coronary artery without angina pectoris: Secondary | ICD-10-CM | POA: Diagnosis not present

## 2018-08-04 DIAGNOSIS — J440 Chronic obstructive pulmonary disease with acute lower respiratory infection: Secondary | ICD-10-CM | POA: Diagnosis not present

## 2018-08-04 DIAGNOSIS — L03116 Cellulitis of left lower limb: Secondary | ICD-10-CM | POA: Diagnosis not present

## 2018-08-04 NOTE — Patient Outreach (Addendum)
[facility]) Care Management   08/04/2018  Cheryl [name] Feb 02, 1951 [id number]  Cheryl [name] is an 67 y.o. female  Subjective:  Patient discussed she has been doing pretty good , she discussed redness in legs and being back on antibiotics again.  Patient discussed looking forward to having her eye surgery in the next few weeks.  Patient reports that her breathing has been doing pretty good, her husband states patient has not had to wear oxygen much recently and does not wear at night , she only wears it when oxygen level is less than 85.  Patient discussed that her appetite and intake has been good , she reports that her weight is up to 106. Patient spouse discussed that she had recent visit with cardiology and pulmonary for clearance to have surgery.   Objective:  BP 130/72 (BP Location: Right Arm, Patient Position: Sitting, Cuff Size: Normal)   Pulse 100   Resp 20   Ht 1.473 m ('4\' 10"'$ )   Wt 106 lb (48.1 kg)   SpO2 93%   BMI 22.15 kg/m  Review of Systems  Constitutional: Negative.   HENT: Negative.   Eyes: Negative.   Respiratory: Negative for shortness of breath.   Cardiovascular: Positive for leg swelling.       Bilateral lower leg swelling ,  left greater than right.   Gastrointestinal: Negative.   Genitourinary: Negative.   Musculoskeletal: Negative.   Skin: Negative.   Neurological: Negative.   Endo/Heme/Allergies: Negative.   Psychiatric/Behavioral: Negative.     Physical Exam  Constitutional: She is oriented to person, place, and time. She appears well-developed.  kyphosis  Cardiovascular: Normal rate and normal heart sounds.  Respiratory: Effort normal and breath sounds normal.  GI: Soft.  Neurological: She is alert and oriented to person, place, and time.  Skin: Skin is warm and dry.  Bilateral lower legs, redden swollen.   Psychiatric: She has a normal mood and affect. Her behavior is normal. Judgment and thought content normal.     Encounter Medications:   Outpatient Encounter Medications as of 08/04/2018  Medication Sig Note  . acetaminophen (TYLENOL) 325 MG tablet Take 2 tablets (650 mg total) by mouth every 6 (six) hours as needed for mild pain (or Fever >/= 101).   Marland Kitchen albuterol (PROVENTIL HFA;VENTOLIN HFA) 108 (90 Base) MCG/ACT inhaler Inhale into the lungs every 6 (six) hours as needed for wheezing or shortness of breath.   Marland Kitchen albuterol (PROVENTIL) (2.5 MG/3ML) 0.083% nebulizer solution Take 3 mLs (2.5 mg total) by nebulization every 4 (four) hours as needed for wheezing.   Marland Kitchen alendronate (FOSAMAX) 70 MG tablet Take 1 tablet (70 mg total) by mouth every 7 (seven) days. Take with a full glass of water on an empty stomach. 06/01/2018: Every Monday  . ALPRAZolam (XANAX) 0.5 MG tablet Take 0.5 tablets (0.25 mg total) by mouth at bedtime as needed for anxiety. (Patient taking differently: Take 0.25-0.5 mg by mouth at bedtime as needed for anxiety. )   . celecoxib (CELEBREX) 100 MG capsule Take 1 capsule (100 mg total) by mouth 2 (two) times daily as needed.   . clopidogrel (PLAVIX) 75 MG tablet Take 75 mg by mouth daily.   . diphenoxylate-atropine (LOMOTIL) 2.5-0.025 MG tablet Take 1 tablet by mouth as needed for diarrhea or loose stools.   . donepezil (ARICEPT) 10 MG tablet Take 10 mg by mouth at bedtime.   Marland Kitchen doxycycline (VIBRA-TABS) 100 MG tablet Take 1 tablet (100 mg  total) by mouth 2 (two) times daily for 14 days.   Marland Kitchen estradiol (ESTRACE) 0.5 MG tablet Take 1 tablet (0.5 mg total) by mouth daily. (Take in place of estropipate) (Patient taking differently: Take 0.5 mg by mouth every other day. (Take in place of estropipate))   . famotidine (PEPCID) 20 MG tablet Take 1 tablet (20 mg total) by mouth daily.   Derrill Memo ON 08/20/2018] fentaNYL (DURAGESIC - DOSED MCG/HR) 50 MCG/HR Place 1 patch (50 mcg total) onto the skin every 3 (three) days.   . ferrous sulfate 325 (65 FE) MG tablet Take 1 tablet (325 mg total) by mouth 2 (two)  times daily with a meal.   . Fluticasone-Salmeterol (ADVAIR DISKUS) 250-50 MCG/DOSE AEPB inhale 1 dose by mouth twice a day   . NARCAN 4 MG/0.1ML LIQD nasal spray kit Place 0.4 mg into the nose once.    . pantoprazole (PROTONIX) 40 MG tablet Take 40 mg by mouth daily.   . pregabalin (LYRICA) 150 MG capsule Take 1 capsule (150 mg total) by mouth 3 (three) times daily.   . silver sulfADIAZINE (SILVADENE) 1 % cream Apply 1 application topically daily as needed.   . simvastatin (ZOCOR) 10 MG tablet Take 10 mg by mouth at bedtime.    . Suvorexant (BELSOMRA) 5 MG TABS Take 1 tablet by mouth at bedtime as needed (for insomnia).   . tiotropium (SPIRIVA HANDIHALER) 18 MCG inhalation capsule inhale the contents of one capsule in the handihaler once daily   . triamcinolone (KENALOG) 0.025 % ointment Apply 1 application topically 2 (two) times daily.   . vitamin B-12 (CYANOCOBALAMIN) 1000 MCG tablet Take 1 tablet (1,000 mcg total) by mouth daily.   . fentaNYL (DURAGESIC - DOSED MCG/HR) 12 MCG/HR Place 1 patch (12.5 mcg total) onto the skin every 3 (three) days. (Patient not taking: Reported on 08/04/2018)   . [START ON 10/19/2018] fentaNYL (DURAGESIC - DOSED MCG/HR) 50 MCG/HR Place 1 patch (50 mcg total) onto the skin every 3 (three) days. (Patient not taking: Reported on 08/04/2018)   . [START ON 09/19/2018] fentaNYL (DURAGESIC - DOSED MCG/HR) 50 MCG/HR Place 1 patch (50 mcg total) onto the skin every 3 (three) days. (Patient not taking: Reported on 08/04/2018)    No facility-administered encounter medications on file as of 08/04/2018.     Functional Status:   In your present state of health, do you have any difficulty performing the following activities: 06/18/2018 06/16/2018  Hearing? N N  Vision? N N  Difficulty concentrating or making decisions? Y N  Walking or climbing stairs? Y Y  Comment - uses walker and husband support and ramps   Dressing or bathing? Y N  Comment - husband assist as needed  Doing  errands, shopping? Y Y  Comment - husband and family assist as needed   Preparing Food and eating ? - Y  Comment - husband prepares   Using the Toilet? - N  In the past six months, have you accidently leaked urine? - Y  Do you have problems with loss of bowel control? - N  Managing your Medications? - Y  Comment - husband assist   Managing your Finances? - Y  Comment - husband Manufacturing engineer or managing your Housekeeping? - Y  Comment - husband and family does   Some recent data might be hidden    Fall/Depression Screening:    Fall Risk  08/03/2018 06/08/2018 05/22/2018  Falls in the past year? Yes Yes  Yes  Comment - - -  Number falls in past yr: - 2 or more -  Injury with Fall? No No -  Comment - - -  Risk Factor Category  - High Fall Risk -  Risk for fall due to : - History of fall(s) -  Risk for fall due to: Comment - - -  Follow up - Falls prevention discussed -   PHQ 2/9 Scores 08/03/2018 06/16/2018 04/27/2018 01/22/2018 11/11/2017 09/23/2017 08/12/2017  PHQ - 2 Score 0 0 0 0 0 0 0  Exception Documentation - - - - Patient refusal Patient refusal -    Assessment:  Routine home visit , [facility] Judeen Hammans and bath aide completing visit upon my arrival . Patient is also followed by home health physical therapy.  Met with patient on her deck at her request .   COPD/Emphysema - Oxygen level at 93% with ambulating in home this am, uses oxygen on and off, Education on benefits of not smoking , admits not being ready to quit.  Patient still followed by Palliative care of [place].   Lower leg swelling - continuing antibiotics as prescribed, encouraged elevating legs at tolerating .   Fall Risk - has new rollator and small wheelchair, reports one fall in the last month , this is a decrease . Patient has increased some with mobility , using her rollator able to make it down her ramp.   Plan:   Will follow up with patient in the next month following her cataract  surgery.  Fall prevention measures reviewed.  Will review progress and assess for additional care coordination needs prior to case closure.    [facility] CM Care Plan Problem One     Most Recent Value  Care Plan Problem One  At risk for readmission related to 2 admits related to COPD, pneumonia, anemia   Role Documenting the Problem One  Care Management Coordinator  Care Plan for Problem One  Active [provided at home visit ]  [place] Term Goal   Patient will be able to report no hospital readmission in the next 60  days  [goal restated ]  [facility] Long Term Goal Start Date  06/23/18 Barrie Folk restart ]  Interventions for Problem One Long Term Goal  Reinforced taking medications as prescribed, advised regarding wearing oxygen to keep levels oxygen level at 89 to 90, discussed clinical benefits of, and preventing readmission.    [facility] CM Short Term Goal #1   Patient will be able to report worsening symptoms of COPD, Pneumonia in the next 30 days   [facility] CM Short Term Goal #1 Start Date  06/23/18  [facility] CM Short Term Goal #1 Met Date  08/04/18 [late entry for goal met ]  [facility] CM Short Term Goal #2   Patient will attend all medical appointment in the next 30 days   [facility] CM Short Term Goal #2 Start Date  06/23/18  [facility] CM Short Term Goal #2 Met Date  08/04/18 [late entry for goal met ]    [facility] CM Care Plan Problem Two     Most Recent Value  Care Plan Problem Two  Impaired skin impairment related to lower leg redness and swelling   Role Documenting the Problem Two  Care Management [place] for Problem Two  Active  [facility] CM Short Term Goal #1   Patient will report improvement in redness/swelling of lower legs over the next 20 days   [facility] CM Short  Term Goal #1 Start Date  08/04/18  Interventions for Short Term Goal #2   Advised regarding importance of completing full antibiotic prescripiton , notifying MD of worsening in swelling or redness , reinforced balanced diet , including protein at meals, reviewed  worsening signs of infection.        Joylene Draft, RN, [place] Management Coordinator  [phone]- Mobile [phone]- Toll Free Main Office

## 2018-08-05 ENCOUNTER — Telehealth: Payer: Self-pay | Admitting: Family Medicine

## 2018-08-05 DIAGNOSIS — I10 Essential (primary) hypertension: Secondary | ICD-10-CM | POA: Diagnosis not present

## 2018-08-05 DIAGNOSIS — J440 Chronic obstructive pulmonary disease with acute lower respiratory infection: Secondary | ICD-10-CM | POA: Diagnosis not present

## 2018-08-05 DIAGNOSIS — I2510 Atherosclerotic heart disease of native coronary artery without angina pectoris: Secondary | ICD-10-CM | POA: Diagnosis not present

## 2018-08-05 DIAGNOSIS — D509 Iron deficiency anemia, unspecified: Secondary | ICD-10-CM | POA: Diagnosis not present

## 2018-08-05 DIAGNOSIS — J441 Chronic obstructive pulmonary disease with (acute) exacerbation: Secondary | ICD-10-CM | POA: Diagnosis not present

## 2018-08-05 DIAGNOSIS — L03116 Cellulitis of left lower limb: Secondary | ICD-10-CM | POA: Diagnosis not present

## 2018-08-05 DIAGNOSIS — J189 Pneumonia, unspecified organism: Secondary | ICD-10-CM | POA: Diagnosis not present

## 2018-08-05 DIAGNOSIS — G609 Hereditary and idiopathic neuropathy, unspecified: Secondary | ICD-10-CM | POA: Diagnosis not present

## 2018-08-05 DIAGNOSIS — M4180 Other forms of scoliosis, site unspecified: Secondary | ICD-10-CM | POA: Diagnosis not present

## 2018-08-05 NOTE — Telephone Encounter (Signed)
Please advise 

## 2018-08-05 NOTE — Telephone Encounter (Signed)
Conception Oms with [facility] is requesting verbal orders for in home nursing visits & bath aid as follows:  Nursing: -Twice a week for 1 week -Once every other week for 8 weeks  Bath aid: Twice a week for 9 weeks  Please advise. Thanks TNP

## 2018-08-05 NOTE — Telephone Encounter (Signed)
Cheryl [name] was given verbal orders.

## 2018-08-05 NOTE — Telephone Encounter (Signed)
That's fine

## 2018-08-07 ENCOUNTER — Telehealth: Payer: Self-pay | Admitting: Family Medicine

## 2018-08-07 DIAGNOSIS — D509 Iron deficiency anemia, unspecified: Secondary | ICD-10-CM | POA: Diagnosis not present

## 2018-08-07 DIAGNOSIS — G609 Hereditary and idiopathic neuropathy, unspecified: Secondary | ICD-10-CM | POA: Diagnosis not present

## 2018-08-07 DIAGNOSIS — M4180 Other forms of scoliosis, site unspecified: Secondary | ICD-10-CM | POA: Diagnosis not present

## 2018-08-07 DIAGNOSIS — I2510 Atherosclerotic heart disease of native coronary artery without angina pectoris: Secondary | ICD-10-CM | POA: Diagnosis not present

## 2018-08-07 DIAGNOSIS — J189 Pneumonia, unspecified organism: Secondary | ICD-10-CM | POA: Diagnosis not present

## 2018-08-07 DIAGNOSIS — I10 Essential (primary) hypertension: Secondary | ICD-10-CM | POA: Diagnosis not present

## 2018-08-07 DIAGNOSIS — J440 Chronic obstructive pulmonary disease with acute lower respiratory infection: Secondary | ICD-10-CM | POA: Diagnosis not present

## 2018-08-07 DIAGNOSIS — L03116 Cellulitis of left lower limb: Secondary | ICD-10-CM | POA: Diagnosis not present

## 2018-08-07 DIAGNOSIS — J441 Chronic obstructive pulmonary disease with (acute) exacerbation: Secondary | ICD-10-CM | POA: Diagnosis not present

## 2018-08-07 LAB — TOXASSURE SELECT 13 (MW), URINE

## 2018-08-07 NOTE — Telephone Encounter (Signed)
That's fine

## 2018-08-07 NOTE — Telephone Encounter (Signed)
Thayer OhmChris with [facility] is requesting verbal orders for in home physical therapy as follows:  Twice a week for 8 weeks starting next week the 08/10/18-08/14/18.   Please advise. Thanks TNP

## 2018-08-07 NOTE — Telephone Encounter (Signed)
Please advise 

## 2018-08-10 ENCOUNTER — Other Ambulatory Visit: Payer: Self-pay | Admitting: Family Medicine

## 2018-08-10 DIAGNOSIS — J441 Chronic obstructive pulmonary disease with (acute) exacerbation: Secondary | ICD-10-CM | POA: Diagnosis not present

## 2018-08-10 DIAGNOSIS — I10 Essential (primary) hypertension: Secondary | ICD-10-CM | POA: Diagnosis not present

## 2018-08-10 DIAGNOSIS — M4180 Other forms of scoliosis, site unspecified: Secondary | ICD-10-CM | POA: Diagnosis not present

## 2018-08-10 DIAGNOSIS — I2510 Atherosclerotic heart disease of native coronary artery without angina pectoris: Secondary | ICD-10-CM | POA: Diagnosis not present

## 2018-08-10 DIAGNOSIS — G609 Hereditary and idiopathic neuropathy, unspecified: Secondary | ICD-10-CM | POA: Diagnosis not present

## 2018-08-10 DIAGNOSIS — D509 Iron deficiency anemia, unspecified: Secondary | ICD-10-CM | POA: Diagnosis not present

## 2018-08-10 DIAGNOSIS — L03116 Cellulitis of left lower limb: Secondary | ICD-10-CM | POA: Diagnosis not present

## 2018-08-10 DIAGNOSIS — L03115 Cellulitis of right lower limb: Secondary | ICD-10-CM | POA: Diagnosis not present

## 2018-08-10 DIAGNOSIS — I739 Peripheral vascular disease, unspecified: Secondary | ICD-10-CM | POA: Diagnosis not present

## 2018-08-10 NOTE — Telephone Encounter (Signed)
Thayer OhmChris was given verbal orders.

## 2018-08-11 ENCOUNTER — Ambulatory Visit (INDEPENDENT_AMBULATORY_CARE_PROVIDER_SITE_OTHER): Payer: Medicare HMO | Admitting: Family Medicine

## 2018-08-11 ENCOUNTER — Encounter: Payer: Self-pay | Admitting: Family Medicine

## 2018-08-11 VITALS — BP 110/58 | HR 86 | Temp 98.7°F | Resp 20 | Wt 106.0 lb

## 2018-08-11 DIAGNOSIS — G609 Hereditary and idiopathic neuropathy, unspecified: Secondary | ICD-10-CM | POA: Diagnosis not present

## 2018-08-11 DIAGNOSIS — G4700 Insomnia, unspecified: Secondary | ICD-10-CM

## 2018-08-11 DIAGNOSIS — L03115 Cellulitis of right lower limb: Secondary | ICD-10-CM | POA: Diagnosis not present

## 2018-08-11 DIAGNOSIS — L03119 Cellulitis of unspecified part of limb: Secondary | ICD-10-CM

## 2018-08-11 DIAGNOSIS — M4180 Other forms of scoliosis, site unspecified: Secondary | ICD-10-CM | POA: Diagnosis not present

## 2018-08-11 DIAGNOSIS — I2510 Atherosclerotic heart disease of native coronary artery without angina pectoris: Secondary | ICD-10-CM | POA: Diagnosis not present

## 2018-08-11 DIAGNOSIS — R609 Edema, unspecified: Secondary | ICD-10-CM | POA: Diagnosis not present

## 2018-08-11 DIAGNOSIS — I10 Essential (primary) hypertension: Secondary | ICD-10-CM | POA: Diagnosis not present

## 2018-08-11 DIAGNOSIS — D509 Iron deficiency anemia, unspecified: Secondary | ICD-10-CM | POA: Diagnosis not present

## 2018-08-11 DIAGNOSIS — I739 Peripheral vascular disease, unspecified: Secondary | ICD-10-CM | POA: Diagnosis not present

## 2018-08-11 DIAGNOSIS — J441 Chronic obstructive pulmonary disease with (acute) exacerbation: Secondary | ICD-10-CM

## 2018-08-11 DIAGNOSIS — L03116 Cellulitis of left lower limb: Secondary | ICD-10-CM | POA: Diagnosis not present

## 2018-08-11 NOTE — Progress Notes (Signed)
Patient: Cheryl [name] Female    DOB: May 14, 1951   67 y.o.   MRN: [id number] Visit Date: 08/11/2018  Today's Provider: Lelon Huh, MD   Chief Complaint  Patient presents with  . COPD   Subjective:    HPI  COPD, Follow up:  She was last seen for this 3 months ago. Changes made include no changes.   She reports good compliance with treatment. She is not having side effects.  she is using rescue inhaler. Typically 3 x per months. She IS experiencing cough. She is NOT experiencing wheezing. she report breathing is Improved.   ------------------------------------------------------------------------  Follow up for edema  The patient was last seen for this 1 months ago. Changes made at last visit include start doxycycline due to underlying cellulitis. She had also been on amlodipine which is now discontinued.   She reports excellent compliance with treatment. She feels that condition is Improved. She is not having side effects.   ------------------------------------------------------------------------------------   Follow up for insomnia  The patient was last seen for this 3 months ago.  She is no longer taking trazodone or mirtazapine, and has started Belsomra and reports she has been sleeping much better.  She reports excellent compliance with treatment. She feels that condition is Improved. She is not having side effects.   ------------------------------------------------------------------------------------    Wt Readings from Last 3 Encounters:  08/11/18 106 lb (48.1 kg)  08/04/18 106 lb (48.1 kg)  08/03/18 106 lb (48.1 kg)       Allergies  Allergen Reactions  . Percocet [Oxycodone-Acetaminophen] Hives and Rash  . Aspirin Nausea And Vomiting and Other (See Comments)    Reaction:  GI upset   . Codeine Nausea And Vomiting, Other (See Comments) and Nausea Only    Reaction:  GI upset   . Propoxyphene Other (See Comments) and Nausea Only   GI upset Reaction:  GI upset   . Sulfa Antibiotics Rash and Other (See Comments)    Reaction:  GI upset      Current Outpatient Medications:  .  acetaminophen (TYLENOL) 325 MG tablet, Take 2 tablets (650 mg total) by mouth every 6 (six) hours as needed for mild pain (or Fever >/= 101)., Disp: , Rfl:  .  albuterol (PROVENTIL HFA;VENTOLIN HFA) 108 (90 Base) MCG/ACT inhaler, Inhale into the lungs every 6 (six) hours as needed for wheezing or shortness of breath., Disp: , Rfl:  .  albuterol (PROVENTIL) (2.5 MG/3ML) 0.083% nebulizer solution, Take 3 mLs (2.5 mg total) by nebulization every 4 (four) hours as needed for wheezing., Disp: 50 vial, Rfl: 12 .  alendronate (FOSAMAX) 70 MG tablet, Take 1 tablet (70 mg total) by mouth every 7 (seven) days. Take with a full glass of water on an empty stomach., Disp: 4 tablet, Rfl: 11 .  ALPRAZolam (XANAX) 0.5 MG tablet, Take 0.5 tablets (0.25 mg total) by mouth at bedtime as needed for anxiety. (Patient taking differently: Take 0.25-0.5 mg by mouth at bedtime as needed for anxiety. ), Disp: 2 tablet, Rfl: 0 .  celecoxib (CELEBREX) 100 MG capsule, Take 1 capsule (100 mg total) by mouth 2 (two) times daily as needed., Disp: 60 capsule, Rfl: 2 .  clopidogrel (PLAVIX) 75 MG tablet, Take 75 mg by mouth daily., Disp: , Rfl:  .  diphenoxylate-atropine (LOMOTIL) 2.5-0.025 MG tablet, Take 1 tablet by mouth as needed for diarrhea or loose stools., Disp: , Rfl:  .  donepezil (ARICEPT) 10 MG tablet, Take  10 mg by mouth at bedtime., Disp: , Rfl:  .  doxycycline (VIBRA-TABS) 100 MG tablet, Take 1 tablet (100 mg total) by mouth 2 (two) times daily for 14 days., Disp: 28 tablet, Rfl: 0 .  estradiol (ESTRACE) 0.5 MG tablet, Take 1 tablet (0.5 mg total) by mouth every other day. (Take in place of estropipate), Disp: 30 tablet, Rfl: 3 .  [START ON 10/19/2018] fentaNYL (DURAGESIC - DOSED MCG/HR) 50 MCG/HR, Place 1 patch (50 mcg total) onto the skin every 3 (three) days., Disp: 10  patch, Rfl: 0 .  ferrous sulfate 325 (65 FE) MG tablet, Take 1 tablet (325 mg total) by mouth 2 (two) times daily with a meal., Disp: 60 tablet, Rfl: 3 .  Fluticasone-Salmeterol (ADVAIR DISKUS) 250-50 MCG/DOSE AEPB, inhale 1 dose by mouth twice a day, Disp: 60 each, Rfl: 2 .  NARCAN 4 MG/0.1ML LIQD nasal spray kit, Place 0.4 mg into the nose once. , Disp: , Rfl:  .  pantoprazole (PROTONIX) 40 MG tablet, Take 40 mg by mouth daily., Disp: , Rfl:  .  pregabalin (LYRICA) 150 MG capsule, Take 1 capsule (150 mg total) by mouth 3 (three) times daily., Disp: 90 capsule, Rfl: 2 .  silver sulfADIAZINE (SILVADENE) 1 % cream, Apply 1 application topically daily as needed., Disp: , Rfl:  .  simvastatin (ZOCOR) 10 MG tablet, Take 10 mg by mouth at bedtime. , Disp: , Rfl:  .  Suvorexant (BELSOMRA) 5 MG TABS, Take 1 tablet by mouth at bedtime as needed (for insomnia)., Disp: 30 tablet, Rfl: 3 .  tiotropium (SPIRIVA HANDIHALER) 18 MCG inhalation capsule, inhale the contents of one capsule in the handihaler once daily, Disp: 30 capsule, Rfl: 12 .  triamcinolone (KENALOG) 0.025 % ointment, Apply 1 application topically 2 (two) times daily., Disp: 15 g, Rfl: 0 .  vitamin B-12 (CYANOCOBALAMIN) 1000 MCG tablet, Take 1 tablet (1,000 mcg total) by mouth daily., Disp: 30 tablet, Rfl: 0  Review of Systems  Constitutional: Negative.   HENT: Negative.   Respiratory: Positive for cough.     Social History   Tobacco Use  . Smoking status: Current Every Day Smoker    Packs/day: 1.00    Years: 50.00    Pack years: 50.00    Types: Cigarettes  . Smokeless tobacco: Never Used  . Tobacco comment: Previously smoked 2 ppd  Substance Use Topics  . Alcohol use: No    Alcohol/week: 0.0 standard drinks   Objective:   BP (!) 110/58 (BP Location: Left Arm, Patient Position: Sitting, Cuff Size: Normal)   Pulse 86   Temp 98.7 F (37.1 C) (Oral)   Resp 20   Wt 106 lb (48.1 kg)   SpO2 95%   BMI 22.15 kg/m  Room  air   Physical Exam   General Appearance:    Alert, cooperative, no distress  Eyes:    PERRL, conjunctiva/corneas clear, EOM's intact       Lungs:     Clear to auscultation bilaterally, respirations unlabored  Heart:    Regular rate and rhythm  Est::   1+ bilateral lower leg edema. Faint dull red erythema, greatly improved from last visit. No open sores or wounds on legs.           Assessment & Plan:     1. Chronic edema Multifactorial. Greatly improved with treatment of cellulitis and discontinuation of amlodipine. Continue current medications.    2. COPD with acute exacerbation ([place]) Doing well on current inhalers  which will be continued.   3. Insomnia, unspecified type Now off of trazodone and mirtazapine, and doing well since starting Belsomra.   4. Cellulitis of lower extremity, unspecified laterality Greatly improved. Is to finish doxycyline prescribed at previous visit. Call if any flare up redness, leg pain, or swelling.   Return in about 3 months (around 11/10/2018).       Lelon Huh, MD  [facility]

## 2018-08-13 DIAGNOSIS — I10 Essential (primary) hypertension: Secondary | ICD-10-CM | POA: Diagnosis not present

## 2018-08-13 DIAGNOSIS — J441 Chronic obstructive pulmonary disease with (acute) exacerbation: Secondary | ICD-10-CM | POA: Diagnosis not present

## 2018-08-13 DIAGNOSIS — G609 Hereditary and idiopathic neuropathy, unspecified: Secondary | ICD-10-CM | POA: Diagnosis not present

## 2018-08-13 DIAGNOSIS — M4180 Other forms of scoliosis, site unspecified: Secondary | ICD-10-CM | POA: Diagnosis not present

## 2018-08-13 DIAGNOSIS — I739 Peripheral vascular disease, unspecified: Secondary | ICD-10-CM | POA: Diagnosis not present

## 2018-08-13 DIAGNOSIS — I2510 Atherosclerotic heart disease of native coronary artery without angina pectoris: Secondary | ICD-10-CM | POA: Diagnosis not present

## 2018-08-13 DIAGNOSIS — L03116 Cellulitis of left lower limb: Secondary | ICD-10-CM | POA: Diagnosis not present

## 2018-08-13 DIAGNOSIS — D509 Iron deficiency anemia, unspecified: Secondary | ICD-10-CM | POA: Diagnosis not present

## 2018-08-13 DIAGNOSIS — L03115 Cellulitis of right lower limb: Secondary | ICD-10-CM | POA: Diagnosis not present

## 2018-08-14 DIAGNOSIS — I10 Essential (primary) hypertension: Secondary | ICD-10-CM | POA: Diagnosis not present

## 2018-08-14 DIAGNOSIS — D509 Iron deficiency anemia, unspecified: Secondary | ICD-10-CM | POA: Diagnosis not present

## 2018-08-14 DIAGNOSIS — J441 Chronic obstructive pulmonary disease with (acute) exacerbation: Secondary | ICD-10-CM | POA: Diagnosis not present

## 2018-08-14 DIAGNOSIS — L03116 Cellulitis of left lower limb: Secondary | ICD-10-CM | POA: Diagnosis not present

## 2018-08-14 DIAGNOSIS — G609 Hereditary and idiopathic neuropathy, unspecified: Secondary | ICD-10-CM | POA: Diagnosis not present

## 2018-08-14 DIAGNOSIS — J449 Chronic obstructive pulmonary disease, unspecified: Secondary | ICD-10-CM | POA: Diagnosis not present

## 2018-08-14 DIAGNOSIS — I2510 Atherosclerotic heart disease of native coronary artery without angina pectoris: Secondary | ICD-10-CM | POA: Diagnosis not present

## 2018-08-14 DIAGNOSIS — M4180 Other forms of scoliosis, site unspecified: Secondary | ICD-10-CM | POA: Diagnosis not present

## 2018-08-14 DIAGNOSIS — L03115 Cellulitis of right lower limb: Secondary | ICD-10-CM | POA: Diagnosis not present

## 2018-08-14 DIAGNOSIS — I739 Peripheral vascular disease, unspecified: Secondary | ICD-10-CM | POA: Diagnosis not present

## 2018-08-15 DIAGNOSIS — L03115 Cellulitis of right lower limb: Secondary | ICD-10-CM | POA: Diagnosis not present

## 2018-08-15 DIAGNOSIS — I739 Peripheral vascular disease, unspecified: Secondary | ICD-10-CM | POA: Diagnosis not present

## 2018-08-15 DIAGNOSIS — L03116 Cellulitis of left lower limb: Secondary | ICD-10-CM | POA: Diagnosis not present

## 2018-08-15 DIAGNOSIS — I2510 Atherosclerotic heart disease of native coronary artery without angina pectoris: Secondary | ICD-10-CM | POA: Diagnosis not present

## 2018-08-15 DIAGNOSIS — D509 Iron deficiency anemia, unspecified: Secondary | ICD-10-CM | POA: Diagnosis not present

## 2018-08-15 DIAGNOSIS — I10 Essential (primary) hypertension: Secondary | ICD-10-CM | POA: Diagnosis not present

## 2018-08-15 DIAGNOSIS — J441 Chronic obstructive pulmonary disease with (acute) exacerbation: Secondary | ICD-10-CM | POA: Diagnosis not present

## 2018-08-15 DIAGNOSIS — G609 Hereditary and idiopathic neuropathy, unspecified: Secondary | ICD-10-CM | POA: Diagnosis not present

## 2018-08-15 DIAGNOSIS — M4180 Other forms of scoliosis, site unspecified: Secondary | ICD-10-CM | POA: Diagnosis not present

## 2018-08-16 DIAGNOSIS — J449 Chronic obstructive pulmonary disease, unspecified: Secondary | ICD-10-CM | POA: Diagnosis not present

## 2018-08-17 ENCOUNTER — Emergency Department: Payer: Medicare HMO

## 2018-08-17 ENCOUNTER — Encounter: Payer: Self-pay | Admitting: Emergency Medicine

## 2018-08-17 ENCOUNTER — Other Ambulatory Visit: Payer: Self-pay

## 2018-08-17 ENCOUNTER — Emergency Department
Admission: EM | Admit: 2018-08-17 | Discharge: 2018-08-17 | Disposition: A | Discharge status: Home / Self Care | Payer: Medicare HMO | Attending: Emergency Medicine | Admitting: Emergency Medicine

## 2018-08-17 DIAGNOSIS — F0390 Unspecified dementia without behavioral disturbance: Secondary | ICD-10-CM | POA: Diagnosis not present

## 2018-08-17 DIAGNOSIS — G609 Hereditary and idiopathic neuropathy, unspecified: Secondary | ICD-10-CM | POA: Diagnosis not present

## 2018-08-17 DIAGNOSIS — M4180 Other forms of scoliosis, site unspecified: Secondary | ICD-10-CM | POA: Diagnosis not present

## 2018-08-17 DIAGNOSIS — M5030 Other cervical disc degeneration, unspecified cervical region: Secondary | ICD-10-CM | POA: Diagnosis not present

## 2018-08-17 DIAGNOSIS — I739 Peripheral vascular disease, unspecified: Secondary | ICD-10-CM | POA: Diagnosis not present

## 2018-08-17 DIAGNOSIS — J441 Chronic obstructive pulmonary disease with (acute) exacerbation: Secondary | ICD-10-CM | POA: Diagnosis not present

## 2018-08-17 DIAGNOSIS — Z79899 Other long term (current) drug therapy: Secondary | ICD-10-CM | POA: Diagnosis not present

## 2018-08-17 DIAGNOSIS — L03115 Cellulitis of right lower limb: Secondary | ICD-10-CM | POA: Diagnosis not present

## 2018-08-17 DIAGNOSIS — L03116 Cellulitis of left lower limb: Secondary | ICD-10-CM | POA: Diagnosis not present

## 2018-08-17 DIAGNOSIS — M542 Cervicalgia: Secondary | ICD-10-CM | POA: Diagnosis not present

## 2018-08-17 DIAGNOSIS — I5030 Unspecified diastolic (congestive) heart failure: Secondary | ICD-10-CM | POA: Insufficient documentation

## 2018-08-17 DIAGNOSIS — I110 Hypertensive heart disease with heart failure: Secondary | ICD-10-CM | POA: Diagnosis not present

## 2018-08-17 DIAGNOSIS — I10 Essential (primary) hypertension: Secondary | ICD-10-CM | POA: Diagnosis not present

## 2018-08-17 DIAGNOSIS — J449 Chronic obstructive pulmonary disease, unspecified: Secondary | ICD-10-CM | POA: Insufficient documentation

## 2018-08-17 DIAGNOSIS — I2510 Atherosclerotic heart disease of native coronary artery without angina pectoris: Secondary | ICD-10-CM | POA: Diagnosis not present

## 2018-08-17 DIAGNOSIS — F17210 Nicotine dependence, cigarettes, uncomplicated: Secondary | ICD-10-CM | POA: Diagnosis not present

## 2018-08-17 DIAGNOSIS — G44309 Post-traumatic headache, unspecified, not intractable: Secondary | ICD-10-CM | POA: Diagnosis not present

## 2018-08-17 DIAGNOSIS — D509 Iron deficiency anemia, unspecified: Secondary | ICD-10-CM | POA: Diagnosis not present

## 2018-08-17 LAB — URINALYSIS, COMPLETE (UACMP) WITH MICROSCOPIC
BILIRUBIN URINE: NEGATIVE
Glucose, UA: NEGATIVE mg/dL
HGB URINE DIPSTICK: NEGATIVE
Ketones, ur: NEGATIVE mg/dL
Leukocytes, UA: NEGATIVE
Nitrite: NEGATIVE
PH: 8 (ref 5.0–8.0)
Protein, ur: NEGATIVE mg/dL
Specific Gravity, Urine: 1.011 (ref 1.005–1.030)

## 2018-08-17 MED ORDER — KETOROLAC TROMETHAMINE 30 MG/ML IJ SOLN
15.0000 mg | Freq: Once | INTRAMUSCULAR | Status: AC
  Administered 2018-08-17: 15 mg via INTRAVENOUS
  Filled 2018-08-17: qty 1

## 2018-08-17 MED ORDER — FENTANYL CITRATE (PF) 100 MCG/2ML IJ SOLN
50.0000 ug | Freq: Once | INTRAMUSCULAR | Status: AC
  Administered 2018-08-17: 50 ug via INTRAVENOUS
  Filled 2018-08-17: qty 2

## 2018-08-17 MED ORDER — DIAZEPAM 2 MG PO TABS
2.0000 mg | ORAL_TABLET | Freq: Once | ORAL | Status: AC
  Administered 2018-08-17: 2 mg via ORAL
  Filled 2018-08-17: qty 1

## 2018-08-17 MED ORDER — BACLOFEN 10 MG PO TABS: 10.0000 mg | ORAL_TABLET | Freq: Three times a day (TID) | ORAL | 0 refills | Status: AC | PRN

## 2018-08-17 NOTE — ED Provider Notes (Signed)
[facility] Emergency Department Provider Note  ____________________________________________   None    (approximate)  I have reviewed the triage vital signs and the nursing notes.   HISTORY  Chief Complaint Neck Pain   HPI Cheryl [name] is a 67 y.o. female who presents to the emergency department for treatment and evaluation of neck pain. Pain started yesterday.  No recent falls or injuries.  She has a physical therapist that comes twice per week.  The physical therapist came today but because of pain was unable to perform usual therapy.  She has 5 mg Flexeril available to be taken if needed.  She did take the muscle relaxer but has not had any relief.  They attempted to schedule an appointment with primary care but they were unable to work her in this afternoon and advised them to come to the emergency department for evaluation.  Patient denies any other pain or any other complaint.  Family seem to think that maybe she is weaker in the legs than usual because she has not wanted to use her walker to ambulate.  Patient states that she is just into much pain to attempt to ambulate.   Past Medical History:  Diagnosis Date  . Allergy   . Anxiety   . Arthritis    fingers  . Aspiration pneumonitis ([place]) 11/24/2015  . Asthma   . Chronic pain   . COPD (chronic obstructive pulmonary disease) ([place])   . Coronary artery disease   . DVT (deep venous thrombosis) ([place])   . GERD (gastroesophageal reflux disease)   . Headache   . Hyperlipidemia   . Hypertension   . Kyphoscoliosis deformity of spine   . Migraines   . Neuropathy 2010  . Osteoporosis   . Oxygen deficiency   . Peripheral vascular disease ([place])   . Pneumonia   . Pneumonia 11/19/2015  . Pneumonia 10/2016  . Pneumonia    aspiration 2018- 4 times in last year  . Vitamin D deficiency   . Wears dentures    full upper and lower    Patient Active Problem List   Diagnosis Date Noted  . Anorexia  07/07/2018  . 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 Goals of care, counseling/discussion   . Palliative care by specialist   . Acute encephalopathy 06/18/2018  . Iron deficiency anemia 06/09/2018  . Acute on chronic respiratory failure ([place]) 06/01/2018  . Gastritis without bleeding   . Hypoxia 05/14/2018  . Generalized weakness 02/08/2018  . Rib fracture 09/19/2017  . Marijuana use 06/05/2017  . Pneumonia 04/29/2017  . Acute postoperative pain 04/14/2017  . Metabolic encephalopathy 67/34[birth year]1937  . Hypokalemia 12/23/2016  . Protein-calorie malnutrition, severe 11/06/2016  . Nausea with vomiting   . Lumbar facet syndrome (Location of Primary Source of Pain) (Bilateral) (L>R) 03/20/2016  . Lumbar spondylosis 03/20/2016  . Opiate use (160 MME/Day) 02/28/2016  . Anemia 02/28/2016  . Osteoarthrosis 12/18/2015  . Long term current use of anticoagulant therapy (Plavix) 12/18/2015  . Long term current use of opiate analgesic 12/04/2015  . Fibromyalgia 12/04/2015  . Acute on chronic respiratory failure with hypoxia ([place]) 11/24/2015  . Dysphagia 11/24/2015  . Acute diastolic CHF (congestive heart failure) ([place]) 11/24/2015  . Leukocytosis 11/24/2015  . Abnormal mammogram of right breast 10/11/2015  . Dilated intrahepatic bile duct 10/11/2015  . Lumbar radicular pain (Bilateral) (L>R) (L4) 09/04/2015  . Chronic low back pain (Location of Primary Source of Pain) (Bilateral) (L>R) 09/04/2015  .  Encounter for therapeutic drug level monitoring 09/04/2015  . Uncomplicated opioid dependence ([place]) 09/04/2015  . Chronic pain syndrome 09/04/2015  . Platelet inhibition due to Plavix 09/04/2015  . COPD with acute exacerbation ([place]) 07/31/2015  . Dementia 07/31/2015  . Airway hyperreactivity 07/03/2015  . Back pain, thoracic 07/03/2015  . Excessive falling 07/03/2015  . Alteration in bowel elimination: incontinence 07/03/2015  . Insomnia 07/03/2015  . Decreased testosterone level  07/03/2015  . Leg weakness 07/03/2015  . Menopausal symptom 07/03/2015  . Migraine 07/03/2015  . Neuropathy ([place]) 07/03/2015  . Fecal occult blood test positive 07/03/2015  . OP (osteoporosis) 07/03/2015  . Panic disorder 07/03/2015  . Compulsive tobacco user syndrome 07/03/2015  . Urinary incontinence 07/03/2015  . Weight loss 07/03/2015  . Essential hypertension 06/06/2015  . GERD (gastroesophageal reflux disease) 06/06/2015  . Hyperlipemia 06/06/2015  . Peripheral nerve disease 04/13/2014  . Anxiety 02/10/2014  . Coronary artery disease 02/10/2014  . Peripheral vascular disease ([place]) 02/10/2014  . Vitamin D deficiency 10/16/2009    Past Surgical History:  Procedure Laterality Date  . ABDOMINAL HYSTERECTOMY  1975   Bilaterl Oophorectomy; [place] to IUD infection  . abdomnal aortic stent  05/30/2008   Dr. Quay Burow  . APPENDECTOMY    . APPENDECTOMY    . cardiac catherization  10/31/2009  . CERVICAL FUSION  C5 - 6/C6-7  . CHOLECYSTECTOMY  1972  . COLONOSCOPY WITH PROPOFOL N/A 07/27/2015   Procedure: COLONOSCOPY WITH PROPOFOL;  Surgeon: Hulen Luster, MD;  Location[facility] ENDOSCOPY;  Service: Gastroenterology;  Laterality: N/A;  . ESOPHAGOGASTRODUODENOSCOPY (EGD) WITH PROPOFOL N/A 07/27/2015   Procedure: ESOPHAGOGASTRODUODENOSCOPY (EGD) WITH PROPOFOL;  Surgeon: Hulen Luster, MD;  Location: [facility] ENDOSCOPY;  Service: Gastroenterology;  Laterality: N/A;  . ESOPHAGOGASTRODUODENOSCOPY (EGD) WITH PROPOFOL N/A 05/19/2018   Procedure: ESOPHAGOGASTRODUODENOSCOPY (EGD) WITH PROPOFOL;  Surgeon: Lucilla Lame, MD;  Location: [facility] ENDOSCOPY;  Service: Endoscopy;  Laterality: N/A;  . FOOT SURGERY Bilateral    5-6 years per patient  . SPINE SURGERY      Prior to Admission medications   Medication Sig Start Date End Date Taking? Authorizing Provider  acetaminophen (TYLENOL) 325 MG tablet Take 2 tablets (650 mg total) by mouth every 6 (six) hours as needed for mild pain (or Fever >/= 101). 05/21/18    Loletha Grayer, MD  albuterol (PROVENTIL HFA;VENTOLIN HFA) 108 (90 Base) MCG/ACT inhaler Inhale 2 puffs into the lungs every 6 (six) hours as needed for wheezing or shortness of breath.     [provider]  albuterol (PROVENTIL) (2.5 MG/3ML) 0.083% nebulizer solution Take 3 mLs (2.5 mg total) by nebulization every 4 (four) hours as needed for wheezing. 12/24/17   Birdie Sons, MD  alendronate (FOSAMAX) 70 MG tablet Take 1 tablet (70 mg total) by mouth every 7 (seven) days. Take with a full glass of water on an empty stomach. Patient taking differently: Take 70 mg by mouth every Monday. Take with a full glass of water on an empty stomach. 01/23/18   Birdie Sons, MD  ALPRAZolam Duanne Moron) 0.5 MG tablet Take 0.5 tablets (0.25 mg total) by mouth at bedtime as needed for anxiety. Patient taking differently: Take 0.25-0.5 mg by mouth at bedtime as needed for anxiety.  05/25/18   Birdie Sons, MD  baclofen (LIORESAL) 10 MG tablet Take 1 tablet (10 mg total) by mouth 3 (three) times daily as needed for muscle spasms. 08/17/18   Jearl Soto, Johnette Abraham B, FNP  celecoxib (CELEBREX) 100 MG capsule  Take 1 capsule (100 mg total) by mouth 2 (two) times daily as needed. Patient taking differently: Take 100 mg by mouth 2 (two) times daily as needed for moderate pain.  07/07/18   Birdie Sons, MD  clopidogrel (PLAVIX) 75 MG tablet Take 75 mg by mouth daily.    [provider]  diphenoxylate-atropine (LOMOTIL) 2.5-0.025 MG tablet Take 1 tablet by mouth as needed for diarrhea or loose stools. 06/10/18   [provider]  donepezil (ARICEPT) 10 MG tablet Take 10 mg by mouth at bedtime.    [provider]  estradiol (ESTRACE) 0.5 MG tablet Take 1 tablet (0.5 mg total) by mouth every other day. (Take in place of estropipate) 08/10/18   Birdie Sons, MD  fentaNYL (DURAGESIC - DOSED MCG/HR) 50 MCG/HR Place 1 patch (50 mcg total) onto the skin every 3 (three) days. 10/19/18 11/18/18  Vevelyn Francois, NP  ferrous sulfate 325 (65 FE) MG tablet Take 1 tablet (325 mg total) by mouth 2 (two) times daily with a meal. 06/05/18   Dustin Flock, MD  Fluticasone-Salmeterol (ADVAIR DISKUS) 250-50 MCG/DOSE AEPB inhale 1 dose by mouth twice a day Patient taking differently: Inhale 1 puff into the lungs 2 (two) times daily. inhale 1 dose by mouth twice a day 07/07/18   Birdie Sons, MD  [facility] 4 MG/0.1ML LIQD nasal spray kit Place 0.4 mg into the nose once.  09/11/17   [provider]  pantoprazole (PROTONIX) 40 MG tablet Take 40 mg by mouth 2 (two) times daily.     [provider]  pregabalin (LYRICA) 150 MG capsule Take 1 capsule (150 mg total) by mouth 3 (three) times daily. 08/03/18 11/01/18  Vevelyn Francois, NP  silver sulfADIAZINE (SILVADENE) 1 % cream Apply 1 application topically daily as needed (leg infection).     [provider]  simvastatin (ZOCOR) 10 MG tablet Take 10 mg by mouth at bedtime.     [provider]  Suvorexant (BELSOMRA) 5 MG TABS Take 1 tablet by mouth at bedtime as needed (for insomnia). Patient taking differently: Take 1 tablet by mouth at bedtime.  07/31/18   Birdie Sons, MD  tiotropium (SPIRIVA HANDIHALER) 18 MCG inhalation capsule inhale the contents of one capsule in the handihaler once daily Patient taking differently: Place 18 mcg into inhaler and inhale daily. inhale the contents of one capsule in the handihaler once daily 01/23/18   Birdie Sons, MD  triamcinolone (KENALOG) 0.025 % ointment Apply 1 application topically 2 (two) times daily. Patient taking differently: Apply 1 application topically 2 (two) times daily as needed (cracking skin).  06/15/18   Birdie Sons, MD  vitamin B-12 (CYANOCOBALAMIN) 1000 MCG tablet Take 1 tablet (1,000 mcg total) by mouth daily. 05/21/18   Loletha Grayer, MD    Allergies Percocet [oxycodone-acetaminophen]; Aspirin; Codeine; Propoxyphene; and Sulfa antibiotics  Family History    Problem Relation Age of Onset  . Cancer Mother   . Arthritis Mother   . Heart disease Mother   . Diabetes Mother        mellitus, type 2  . Heart disease Father   . Diabetes Sister   . Cancer Brother   . Cancer Brother        lung  . Diabetes Brother     Social History Social History   Tobacco Use  . Smoking status: Current Every Day Smoker    Packs/day: 1.00    Years: 50.00  Pack years: 50.00    Types: Cigarettes  . Smokeless tobacco: Never Used  . Tobacco comment: Previously smoked 2 ppd  Substance Use Topics  . Alcohol use: No    Alcohol/week: 0.0 standard drinks  . Drug use: No    Review of Systems  Constitutional: No fever/chills Eyes: No visual changes. ENT: No sore throat. Cardiovascular: Denies chest pain. Respiratory: Denies shortness of breath. Gastrointestinal: No abdominal pain.  No nausea, no vomiting.  No diarrhea.  No constipation. Genitourinary: Negative for dysuria. Musculoskeletal: Positive for neck pain Skin: Negative for rash. Neurological: Negative for headaches, focal weakness or numbness. ____________________________________________   PHYSICAL EXAM:  VITAL SIGNS: ED Triage Vitals  Enc Vitals Group     BP 08/17/18 1651 (!) 142/63     Pulse Rate 08/17/18 1651 93     Resp 08/17/18 1651 18     Temp 08/17/18 1651 99.1 F (37.3 C)     Temp Source 08/17/18 1651 Oral     SpO2 08/17/18 1651 95 %     Weight 08/17/18 1654 106 lb (48.1 kg)     Height 08/17/18 1654 '4\' 10"'$  (1.473 m)     Head Circumference --      Peak Flow --      Pain Score 08/17/18 1653 8     Pain Loc --      Pain Edu? --      Excl. in [place]? --     Constitutional: Alert and oriented.  Chronically ill appearing and in no acute distress. Eyes: Conjunctivae are normal. Head: Atraumatic. Nose: No congestion/rhinnorhea. Mouth/Throat: Mucous membranes are moist.  Oropharynx non-erythematous. Neck: No stridor.   Cardiovascular: Normal rate, regular rhythm. Grossly normal  heart sounds.  Good peripheral circulation. Respiratory: Normal respiratory effort.  No retractions. Lungs CTAB. Gastrointestinal: Soft and nontender. No distention.  Musculoskeletal: Difficult to assess the cervical spine due to patient's positioning.  Right side paracervical tenderness with palpation.  Rotation of the head to the left triggers pain. Neurologic:  Normal speech and language. No gross focal neurologic deficits are appreciated. No gait instability. Skin:  Skin is warm, dry and intact. No rash noted. Psychiatric: Mood and affect are normal. Speech and behavior are normal.  ____________________________________________   LABS (all labs ordered are listed, but only abnormal results are displayed)  Labs Reviewed  URINALYSIS, COMPLETE (UACMP) WITH MICROSCOPIC - Abnormal; Notable for the following components:      Result Value   Color, Urine YELLOW (*)    APPearance CLEAR (*)    Bacteria, UA RARE (*)    All other components within normal limits   ____________________________________________  EKG  Not indicated ____________________________________________  RADIOLOGY  ED MD interpretation: CT of the head and cervical spine are negative for acute bony abnormality.  Official radiology report(s): Ct Head Wo Contrast  Result Date: 08/17/2018 CLINICAL DATA:  Atraumatic headache and neck pain since this morning. EXAM: CT HEAD WITHOUT CONTRAST CT CERVICAL SPINE WITHOUT CONTRAST TECHNIQUE: Multidetector CT imaging of the head and cervical spine was performed following the standard protocol without intravenous contrast. Multiplanar CT image reconstructions of the cervical spine were also generated. COMPARISON:  06/18/2018 head CT.  01/26/2013 cervical spine CT FINDINGS: CT HEAD FINDINGS Brain: No evidence of parenchymal hemorrhage or extra-axial fluid collection. No mass lesion, mass effect, or midline shift. No CT evidence of acute infarction. Nonspecific mild stable subcortical and  periventricular white matter hypodensity, most in keeping with chronic small vessel ischemic change. Cerebral volume is  age appropriate. No ventriculomegaly. Vascular: No acute abnormality. Skull: No evidence of calvarial fracture. Sinuses/Orbits: No fluid levels. Mucoperiosteal thickening in the right greater than left ethmoidal air cells. Other:  The mastoid air cells are unopacified. CT CERVICAL SPINE FINDINGS Alignment: Reversal of the normal cervical lordosis. No facet subluxation. Dens is well positioned between the lateral masses of C1. There is 2 mm anterolisthesis at C3-4, 3 mm anterolisthesis at C4-5, 3 mm retrolisthesis at C5-6 and 2 mm retrolisthesis at C6-7, all worsened since 01/26/2013. Skull base and vertebrae: No acute fracture. No primary bone lesion or focal pathologic process. Soft tissues and spinal canal: No prevertebral edema. No visible canal hematoma. Disc levels: Severe degenerative disc disease at C5-6 and C6-7 with eburnation at these levels, worsened. Mild degenerative disc disease at C4-5. Advanced bilateral facet arthropathy. Mild degenerative foraminal stenosis bilaterally at C5-6 and on the left at C6-7. Mild effacement of the anterior cervical canal at C5-6 by posterior disc osteophytes. Upper chest: No acute abnormality. Other: Visualized mastoid air cells appear clear. Hypodense 1.2 cm anterior right thyroid lobe nodule. No pathologically enlarged cervical nodes. IMPRESSION: CT HEAD: 1.  No evidence of acute intracranial abnormality. 2. Mild chronic small vessel ischemic changes in the cerebral white matter. 3. Mild paranasal sinusitis, chronic appearing. CT CERVICAL SPINE: 1. Severe degenerative disc disease at C5-6 and C6-7 with eburnation at these levels, worsened. 2. Reversal of the normal cervical lordosis. Multilevel spondylolisthesis in the cervical spine as detailed, worsened. If there is clinical concern for ligamentous instability, recommend cervical spine MRI. 3.  Advanced bilateral facet arthropathy. Mild degenerative foraminal stenosis as detailed. Electronically Signed   By: Ilona Sorrel M.D.   On: 08/17/2018 21:38   Ct Cervical Spine Wo Contrast  Result Date: 08/17/2018 CLINICAL DATA:  Atraumatic headache and neck pain since this morning. EXAM: CT HEAD WITHOUT CONTRAST CT CERVICAL SPINE WITHOUT CONTRAST TECHNIQUE: Multidetector CT imaging of the head and cervical spine was performed following the standard protocol without intravenous contrast. Multiplanar CT image reconstructions of the cervical spine were also generated. COMPARISON:  06/18/2018 head CT.  01/26/2013 cervical spine CT FINDINGS: CT HEAD FINDINGS Brain: No evidence of parenchymal hemorrhage or extra-axial fluid collection. No mass lesion, mass effect, or midline shift. No CT evidence of acute infarction. Nonspecific mild stable subcortical and periventricular white matter hypodensity, most in keeping with chronic small vessel ischemic change. Cerebral volume is age appropriate. No ventriculomegaly. Vascular: No acute abnormality. Skull: No evidence of calvarial fracture. Sinuses/Orbits: No fluid levels. Mucoperiosteal thickening in the right greater than left ethmoidal air cells. Other:  The mastoid air cells are unopacified. CT CERVICAL SPINE FINDINGS Alignment: Reversal of the normal cervical lordosis. No facet subluxation. Dens is well positioned between the lateral masses of C1. There is 2 mm anterolisthesis at C3-4, 3 mm anterolisthesis at C4-5, 3 mm retrolisthesis at C5-6 and 2 mm retrolisthesis at C6-7, all worsened since 01/26/2013. Skull base and vertebrae: No acute fracture. No primary bone lesion or focal pathologic process. Soft tissues and spinal canal: No prevertebral edema. No visible canal hematoma. Disc levels: Severe degenerative disc disease at C5-6 and C6-7 with eburnation at these levels, worsened. Mild degenerative disc disease at C4-5. Advanced bilateral facet arthropathy. Mild  degenerative foraminal stenosis bilaterally at C5-6 and on the left at C6-7. Mild effacement of the anterior cervical canal at C5-6 by posterior disc osteophytes. Upper chest: No acute abnormality. Other: Visualized mastoid air cells appear clear. Hypodense 1.2 cm anterior right thyroid  lobe nodule. No pathologically enlarged cervical nodes. IMPRESSION: CT HEAD: 1.  No evidence of acute intracranial abnormality. 2. Mild chronic small vessel ischemic changes in the cerebral white matter. 3. Mild paranasal sinusitis, chronic appearing. CT CERVICAL SPINE: 1. Severe degenerative disc disease at C5-6 and C6-7 with eburnation at these levels, worsened. 2. Reversal of the normal cervical lordosis. Multilevel spondylolisthesis in the cervical spine as detailed, worsened. If there is clinical concern for ligamentous instability, recommend cervical spine MRI. 3. Advanced bilateral facet arthropathy. Mild degenerative foraminal stenosis as detailed. Electronically Signed   By: Ilona Sorrel M.D.   On: 08/17/2018 21:38    ____________________________________________   PROCEDURES  Procedure(s) performed: None  Procedures  Critical Care performed: No  ____________________________________________   INITIAL IMPRESSION / ASSESSMENT AND PLAN / ED COURSE  As part of my medical decision making, I reviewed the following data within the [facility] chart reviewed   ----------------------------------------- 8:21 PM on 08/17/2018 -----------------------------------------  Some decrease in pain after medications.   Clinical Course as of Aug 18 2251  Mon Aug 17, 2018  2153 CT Head Wo Contrast [CT]    Clinical Course User Index [CT] Victorino Dike, FNP   ----------------------------------------- 10:48 PM on 08/17/2018 -----------------------------------------  Patient is resting more comfortably.  She states that her pain has decreased and that she feels ready to be discharged home.   She will be prescribed baclofen and is to contact her pain management provider or primary care provider tomorrow.  Caregiver states that she has an appointment with pain management tomorrow.  He was advised to return with her to the emergency department for symptoms that change or worsen if she is unable to schedule an appointment.  ____________________________________________   FINAL CLINICAL IMPRESSION(S) / ED DIAGNOSES  Final diagnoses:  Degenerative disc disease, cervical     ED Discharge Orders         Ordered    baclofen (LIORESAL) 10 MG tablet  3 times daily PRN     08/17/18 2250           Note:  This document was prepared using Dragon voice recognition software and may include unintentional dictation errors.    Victorino Dike, FNP 08/17/18 2252    Harvest Dark, MD 08/17/18 2355

## 2018-08-17 NOTE — ED Provider Notes (Signed)
-----------------------------------------   10:54 PM on 08/17/2018 -----------------------------------------  Patient seen in conjunction with nurse practitioner Triplett.  Patient was able to move her neck somewhat now.  Her pain she states is improved.  CT scans are nonrevealing.  Highly suspect more muscular type pain.  We will prescribe short course of baclofen and have the patient follow-up with her doctor.   Minna AntisPaduchowski, Docia Klar, MD 08/17/18 2255

## 2018-08-17 NOTE — ED Notes (Signed)
Pt returned from CT at this time.  

## 2018-08-17 NOTE — ED Notes (Signed)
Pt taken to POV in wheelchair. VSS. NAD. Discharge instructions, RX and follow up reviewed. All questions and concerns addressed.  

## 2018-08-17 NOTE — ED Triage Notes (Addendum)
R side of neck pain began yesterday. No fall or injury. States normally has back pain and use a fentanyl patch usually but does not have one since bath this am. Took flereil but states did not decrease papin. Patient noted leaning to R on armrest but when questioned is alert and oriented. Denies injury. Patient cannot sit up with neck up due to pain. Hard collar applied. MD consulted, no imaging at present.

## 2018-08-17 NOTE — Discharge Instructions (Signed)
Please follow up with pain management and primary care.  Return to the ER for symptoms that change or worsen if unable to schedule an appointment.

## 2018-08-17 NOTE — ED Notes (Signed)
Patient transported to CT 

## 2018-08-18 ENCOUNTER — Other Ambulatory Visit: Payer: Self-pay | Admitting: Family Medicine

## 2018-08-18 ENCOUNTER — Encounter: Payer: Self-pay | Admitting: *Deleted

## 2018-08-18 DIAGNOSIS — L03115 Cellulitis of right lower limb: Secondary | ICD-10-CM | POA: Diagnosis not present

## 2018-08-18 DIAGNOSIS — J441 Chronic obstructive pulmonary disease with (acute) exacerbation: Secondary | ICD-10-CM | POA: Diagnosis not present

## 2018-08-18 DIAGNOSIS — G609 Hereditary and idiopathic neuropathy, unspecified: Secondary | ICD-10-CM | POA: Diagnosis not present

## 2018-08-18 DIAGNOSIS — M542 Cervicalgia: Secondary | ICD-10-CM | POA: Diagnosis not present

## 2018-08-18 DIAGNOSIS — I739 Peripheral vascular disease, unspecified: Secondary | ICD-10-CM | POA: Diagnosis not present

## 2018-08-18 DIAGNOSIS — I2510 Atherosclerotic heart disease of native coronary artery without angina pectoris: Secondary | ICD-10-CM | POA: Diagnosis not present

## 2018-08-18 DIAGNOSIS — M4180 Other forms of scoliosis, site unspecified: Secondary | ICD-10-CM | POA: Diagnosis not present

## 2018-08-18 DIAGNOSIS — L03116 Cellulitis of left lower limb: Secondary | ICD-10-CM | POA: Diagnosis not present

## 2018-08-18 DIAGNOSIS — I10 Essential (primary) hypertension: Secondary | ICD-10-CM | POA: Diagnosis not present

## 2018-08-18 DIAGNOSIS — D509 Iron deficiency anemia, unspecified: Secondary | ICD-10-CM | POA: Diagnosis not present

## 2018-08-19 DIAGNOSIS — J441 Chronic obstructive pulmonary disease with (acute) exacerbation: Secondary | ICD-10-CM | POA: Diagnosis not present

## 2018-08-19 DIAGNOSIS — M4180 Other forms of scoliosis, site unspecified: Secondary | ICD-10-CM | POA: Diagnosis not present

## 2018-08-19 DIAGNOSIS — I739 Peripheral vascular disease, unspecified: Secondary | ICD-10-CM | POA: Diagnosis not present

## 2018-08-19 DIAGNOSIS — D509 Iron deficiency anemia, unspecified: Secondary | ICD-10-CM | POA: Diagnosis not present

## 2018-08-19 DIAGNOSIS — G609 Hereditary and idiopathic neuropathy, unspecified: Secondary | ICD-10-CM | POA: Diagnosis not present

## 2018-08-19 DIAGNOSIS — I10 Essential (primary) hypertension: Secondary | ICD-10-CM | POA: Diagnosis not present

## 2018-08-19 DIAGNOSIS — L03116 Cellulitis of left lower limb: Secondary | ICD-10-CM | POA: Diagnosis not present

## 2018-08-19 DIAGNOSIS — I2510 Atherosclerotic heart disease of native coronary artery without angina pectoris: Secondary | ICD-10-CM | POA: Diagnosis not present

## 2018-08-19 DIAGNOSIS — L03115 Cellulitis of right lower limb: Secondary | ICD-10-CM | POA: Diagnosis not present

## 2018-08-20 ENCOUNTER — Other Ambulatory Visit: Payer: Self-pay

## 2018-08-20 ENCOUNTER — Encounter
Admission: RE | Disposition: A | Discharge status: Home / Self Care | Payer: Self-pay | Source: Ambulatory Visit | Attending: Ophthalmology

## 2018-08-20 ENCOUNTER — Ambulatory Visit
Admission: RE | Admit: 2018-08-20 | Discharge: 2018-08-20 | Disposition: A | Discharge status: Home / Self Care | Payer: Medicare HMO | Source: Ambulatory Visit | Attending: Ophthalmology | Admitting: Ophthalmology

## 2018-08-20 ENCOUNTER — Ambulatory Visit: Payer: Medicare HMO | Admitting: Anesthesiology

## 2018-08-20 DIAGNOSIS — I5031 Acute diastolic (congestive) heart failure: Secondary | ICD-10-CM | POA: Diagnosis not present

## 2018-08-20 DIAGNOSIS — F17200 Nicotine dependence, unspecified, uncomplicated: Secondary | ICD-10-CM | POA: Insufficient documentation

## 2018-08-20 DIAGNOSIS — E7800 Pure hypercholesterolemia, unspecified: Secondary | ICD-10-CM | POA: Diagnosis not present

## 2018-08-20 DIAGNOSIS — K219 Gastro-esophageal reflux disease without esophagitis: Secondary | ICD-10-CM | POA: Diagnosis not present

## 2018-08-20 DIAGNOSIS — J441 Chronic obstructive pulmonary disease with (acute) exacerbation: Secondary | ICD-10-CM | POA: Diagnosis not present

## 2018-08-20 DIAGNOSIS — F0390 Unspecified dementia without behavioral disturbance: Secondary | ICD-10-CM | POA: Insufficient documentation

## 2018-08-20 DIAGNOSIS — F329 Major depressive disorder, single episode, unspecified: Secondary | ICD-10-CM | POA: Insufficient documentation

## 2018-08-20 DIAGNOSIS — M810 Age-related osteoporosis without current pathological fracture: Secondary | ICD-10-CM | POA: Insufficient documentation

## 2018-08-20 DIAGNOSIS — H2512 Age-related nuclear cataract, left eye: Secondary | ICD-10-CM | POA: Diagnosis not present

## 2018-08-20 DIAGNOSIS — J449 Chronic obstructive pulmonary disease, unspecified: Secondary | ICD-10-CM | POA: Diagnosis not present

## 2018-08-20 DIAGNOSIS — F418 Other specified anxiety disorders: Secondary | ICD-10-CM | POA: Diagnosis not present

## 2018-08-20 DIAGNOSIS — Z79899 Other long term (current) drug therapy: Secondary | ICD-10-CM | POA: Diagnosis not present

## 2018-08-20 DIAGNOSIS — I110 Hypertensive heart disease with heart failure: Secondary | ICD-10-CM | POA: Insufficient documentation

## 2018-08-20 DIAGNOSIS — I509 Heart failure, unspecified: Secondary | ICD-10-CM | POA: Diagnosis not present

## 2018-08-20 HISTORY — DX: Encephalopathy, unspecified: G93.40

## 2018-08-20 HISTORY — DX: Anemia, unspecified: D64.9

## 2018-08-20 HISTORY — DX: Cardiac arrhythmia, unspecified: I49.9

## 2018-08-20 HISTORY — PX: CATARACT EXTRACTION W/PHACO: SHX586

## 2018-08-20 SURGERY — PHACOEMULSIFICATION, CATARACT, WITH IOL INSERTION
Anesthesia: Monitor Anesthesia Care | Site: Eye | Laterality: Left

## 2018-08-20 MED ORDER — MIDAZOLAM HCL 2 MG/2ML IJ SOLN
INTRAMUSCULAR | Status: DC | PRN
  Administered 2018-08-20 (×2): .5 mg via INTRAVENOUS

## 2018-08-20 MED ORDER — FENTANYL CITRATE (PF) 100 MCG/2ML IJ SOLN
INTRAMUSCULAR | Status: AC
  Filled 2018-08-20: qty 2

## 2018-08-20 MED ORDER — SODIUM HYALURONATE 23 MG/ML IO SOLN
INTRAOCULAR | Status: DC | PRN
  Administered 2018-08-20: 0.6 mL via INTRAOCULAR

## 2018-08-20 MED ORDER — TETRACAINE HCL 0.5 % OP SOLN
OPHTHALMIC | Status: AC
  Administered 2018-08-20: 1 [drp] via OPHTHALMIC
  Filled 2018-08-20: qty 4

## 2018-08-20 MED ORDER — LIDOCAINE HCL (PF) 4 % IJ SOLN
INTRAMUSCULAR | Status: AC
  Filled 2018-08-20: qty 5

## 2018-08-20 MED ORDER — TETRACAINE HCL 0.5 % OP SOLN
1.0000 [drp] | OPHTHALMIC | Status: AC
  Administered 2018-08-20 (×3): 1 [drp] via OPHTHALMIC

## 2018-08-20 MED ORDER — SODIUM HYALURONATE 10 MG/ML IO SOLN
INTRAOCULAR | Status: DC | PRN
  Administered 2018-08-20: 0.55 mL via INTRAOCULAR

## 2018-08-20 MED ORDER — MOXIFLOXACIN HCL 0.5 % OP SOLN: 1.0000 [drp] | OPHTHALMIC | Status: DC | PRN

## 2018-08-20 MED ORDER — ARMC OPHTHALMIC DILATING DROPS
1.0000 "application " | OPHTHALMIC | Status: AC
  Administered 2018-08-20 (×3): 1 via OPHTHALMIC

## 2018-08-20 MED ORDER — SODIUM CHLORIDE 0.9 % IV SOLN
INTRAVENOUS | Status: DC
  Administered 2018-08-20 (×2): via INTRAVENOUS

## 2018-08-20 MED ORDER — POVIDONE-IODINE 5 % OP SOLN
OPHTHALMIC | Status: AC
  Filled 2018-08-20: qty 30

## 2018-08-20 MED ORDER — SODIUM HYALURONATE 23 MG/ML IO SOLN
INTRAOCULAR | Status: AC
  Filled 2018-08-20: qty 0.6

## 2018-08-20 MED ORDER — MOXIFLOXACIN HCL 0.5 % OP SOLN
OPHTHALMIC | Status: AC
  Filled 2018-08-20: qty 3

## 2018-08-20 MED ORDER — ONDANSETRON HCL 4 MG/2ML IJ SOLN: 4.0000 mg | Freq: Once | INTRAMUSCULAR | Status: DC | PRN

## 2018-08-20 MED ORDER — EPINEPHRINE PF 1 MG/ML IJ SOLN
INTRAMUSCULAR | Status: AC
  Filled 2018-08-20: qty 2

## 2018-08-20 MED ORDER — EPINEPHRINE PF 1 MG/ML IJ SOLN
INTRAOCULAR | Status: DC | PRN
  Administered 2018-08-20: 250 mL via OPHTHALMIC

## 2018-08-20 MED ORDER — LIDOCAINE HCL (PF) 4 % IJ SOLN
INTRAOCULAR | Status: DC | PRN
  Administered 2018-08-20: 4 mL via OPHTHALMIC

## 2018-08-20 MED ORDER — MOXIFLOXACIN HCL 0.5 % OP SOLN
OPHTHALMIC | Status: DC | PRN
Start: 2018-08-20 — End: 2018-08-20
  Administered 2018-08-20: 0.2 mL via OPHTHALMIC

## 2018-08-20 MED ORDER — POVIDONE-IODINE 5 % OP SOLN
OPHTHALMIC | Status: DC | PRN
  Administered 2018-08-20: 1 via OPHTHALMIC

## 2018-08-20 MED ORDER — MIDAZOLAM HCL 2 MG/2ML IJ SOLN
INTRAMUSCULAR | Status: AC
  Filled 2018-08-20: qty 2

## 2018-08-20 MED ORDER — ARMC OPHTHALMIC DILATING DROPS
OPHTHALMIC | Status: AC
  Administered 2018-08-20: 1 via OPHTHALMIC
  Filled 2018-08-20: qty 0.5

## 2018-08-20 SURGICAL SUPPLY — 17 items
BLADE INTREPID 2.4 (BLADE) ×2 IMPLANT
DISSECTOR HYDRO NUCLEUS 50X22 (MISCELLANEOUS) ×3 IMPLANT
GLOVE BIO SURGEON STRL SZ8 (GLOVE) ×3 IMPLANT
GLOVE BIOGEL M 6.5 STRL (GLOVE) ×3 IMPLANT
GLOVE SURG LX 7.5 STRW (GLOVE) ×2
GLOVE SURG LX STRL 7.5 STRW (GLOVE) ×1 IMPLANT
GOWN STRL REUS W/ TWL LRG LVL3 (GOWN DISPOSABLE) ×2 IMPLANT
GOWN STRL REUS W/TWL LRG LVL3 (GOWN DISPOSABLE) ×6
LABEL CATARACT MEDS ST (LABEL) ×3 IMPLANT
LENS IOL TECNIS ITEC 16.0 (Intraocular Lens) ×2 IMPLANT
PACK CATARACT (MISCELLANEOUS) ×3 IMPLANT
PACK CATARACT KING (MISCELLANEOUS) ×3 IMPLANT
PACK EYE AFTER SURG (MISCELLANEOUS) ×3 IMPLANT
SOL BSS BAG (MISCELLANEOUS) ×3
SOLUTION BSS BAG (MISCELLANEOUS) ×1 IMPLANT
WATER STERILE IRR 250ML POUR (IV SOLUTION) ×3 IMPLANT
WIPE NON LINTING 3.25X3.25 (MISCELLANEOUS) ×3 IMPLANT

## 2018-08-20 NOTE — Anesthesia Postprocedure Evaluation (Signed)
Anesthesia Post Note  Patient: Cheryl [name]  Procedure(s) Performed: CATARACT EXTRACTION PHACO AND INTRAOCULAR LENS PLACEMENT ([place]) (Left Eye)  Patient location during evaluation: Short Stay Anesthesia Type: MAC Level of consciousness: awake, awake and alert and oriented Pain management: pain level controlled Vital Signs Assessment: post-procedure vital signs reviewed and stable Respiratory status: spontaneous breathing Cardiovascular status: blood pressure returned to baseline and stable Postop Assessment: no headache Anesthetic complications: no     Last Vitals:  Vitals:   08/20/18 0618 08/20/18 0912  BP: (!) 112/54 (!) 133/59  Pulse: 86 88  Resp: 16 16  Temp: 36.4 C (!) 36.2 C  SpO2: 95% 97%    Last Pain:  Vitals:   08/20/18 0912  TempSrc: Temporal  PainSc: 0-No pain                 Buckner [place]

## 2018-08-20 NOTE — Transfer of Care (Addendum)
Immediate Anesthesia Transfer of Care Note  Patient: Cheryl [name]  Procedure(s) Performed: CATARACT EXTRACTION PHACO AND INTRAOCULAR LENS PLACEMENT ([facility]) (Left Eye)  Patient Location: PACU  Anesthesia Type:MAC  Level of Consciousness: awake, alert  and oriented  Airway & Oxygen Therapy: Patient Spontanous Breathing and Patient connected to nasal cannula oxygen  Post-op Assessment: Report given to RN and Post -op Vital signs reviewed and stable  Post vital signs: Reviewed and stable  Last Vitals:  Vitals Value Taken Time  BP    Temp    Pulse    Resp    SpO2      Last Pain:  Vitals:   08/20/18 0639  TempSrc:   PainSc: 3          Complications: No apparent anesthesia complications

## 2018-08-20 NOTE — Anesthesia Post-op Follow-up Note (Signed)
Anesthesia QCDR form completed.        

## 2018-08-20 NOTE — Anesthesia Procedure Notes (Signed)
Date/Time: 08/20/2018 8:57 AM Performed by: Henrietta Hoover, CRNA Pre-anesthesia Checklist: Patient identified, Emergency Drugs available, Suction available, Patient being monitored and Timeout performed Patient Re-evaluated:Patient Re-evaluated prior to induction Oxygen Delivery Method: Nasal cannula Placement Confirmation: positive ETCO2

## 2018-08-20 NOTE — H&P (Signed)
The History and Physical notes are on paper, have been signed, and are to be scanned.   I have examined the patient and there are no changes to the H&P.   Willey Blade 08/20/2018 8:39 AM

## 2018-08-20 NOTE — Op Note (Signed)
OPERATIVE NOTE  Cheryl [name] [id number] 08/20/2018   PREOPERATIVE DIAGNOSIS:  Nuclear sclerotic cataract left eye.  H25.12   POSTOPERATIVE DIAGNOSIS:    Nuclear sclerotic cataract left eye.     PROCEDURE:  Phacoemusification with posterior chamber intraocular lens placement of the left eye   LENS:   Implant Name Type Inv. Item Serial No. Manufacturer Lot No. LRB No. Used  LENS IOL DIOP 16.0 - [id number] Intraocular Lens LENS IOL DIOP 16.0 [id number]  Left 1       PCB00 +16.0   ULTRASOUND TIME: 0 minutes 45 seconds.  CDE 5.54   SURGEON:  Willey Blade, MD, MPH   ANESTHESIA:  Topical with tetracaine drops augmented with 1% preservative-free intracameral lidocaine.  ESTIMATED BLOOD LOSS: <1 mL   COMPLICATIONS:  None.   DESCRIPTION OF PROCEDURE:  The patient was identified in the holding room and transported to the operating room and placed in the supine position under the operating microscope.  The left eye was identified as the operative eye and it was prepped and draped in the usual sterile ophthalmic fashion.   A 1.0 millimeter clear-corneal paracentesis was made at the 5:00 position. 0.5 ml of preservative-free 1% lidocaine with epinephrine was injected into the anterior chamber.  The anterior chamber was filled with Healon 5 viscoelastic.  A 2.4 millimeter keratome was used to make a near-clear corneal incision at the 2:00 position.  A curvilinear capsulorrhexis was made with a cystotome and capsulorrhexis forceps.  Balanced salt solution was used to hydrodissect and hydrodelineate the nucleus.   Phacoemulsification was then used in stop and chop fashion to remove the lens nucleus and epinucleus.  The remaining cortex was then removed using the irrigation and aspiration handpiece. Healon was then placed into the capsular bag to distend it for lens placement.  A lens was then injected into the capsular bag.  The remaining viscoelastic was aspirated.   Wounds were hydrated  with balanced salt solution.  The anterior chamber was inflated to a physiologic pressure with balanced salt solution.   Intracameral vigamox 0.1 mL undiltued was injected into the eye and a drop placed onto the ocular surface.  No wound leaks were noted.  The patient was taken to the recovery room in stable condition without complications of anesthesia or surgery  Willey Blade 08/20/2018, 9:08 AM

## 2018-08-20 NOTE — Discharge Instructions (Addendum)
Eye Surgery Discharge Instructions  Expect mild scratchy sensation or mild soreness. DO NOT RUB YOUR EYE!  The day of surgery:  Minimal physical activity, but bed rest is not required  No reading, computer work, or close hand work  No bending, lifting, or straining.  May watch TV  For 24 hours:  No driving, legal decisions, or alcoholic beverages  Safety precautions  Eat anything you prefer: It is better to start with liquids, then soup then solid foods.  Solar shield eyeglasses should be worn for comfort in the sunlight/patch while sleeping  Resume all regular medications including aspirin or Coumadin if these were discontinued prior to surgery. You may shower, bathe, shave, or wash your hair. Tylenol may be taken for mild discomfort. FOLLOW DR. Elmer Bales EYE DROP INSTRUCTION SHEET AS REVIEWED.  Call your doctor if you experience significant pain, nausea, or vomiting, fever > 101 or other signs of infection. [phone] or [phone] Specific instructions:  Follow-up Information    [place] Crane, MD Follow up.   Specialty:  Ophthalmology Why:  08/21/18 @ 9:25 am Contact information: [address] [place] [address] [phone]

## 2018-08-20 NOTE — Anesthesia Preprocedure Evaluation (Signed)
Anesthesia Evaluation  Patient identified by MRN, date of birth, ID band Patient awake    Reviewed: Allergy & Precautions, NPO status , Patient's Chart, lab work & pertinent test results  History of Anesthesia Complications Negative for: history of anesthetic complications  Airway Mallampati: III       Dental  (+) Upper Dentures, Lower Dentures   Pulmonary asthma , neg sleep apnea, COPD (O@ prn),  COPD inhaler, Current Smoker,           Cardiovascular hypertension, Pt. on medications +CHF  (-) Past MI + dysrhythmias Atrial Fibrillation (-) Valvular Problems/Murmurs     Neuro/Psych neg Seizures Anxiety Depression Dementia    GI/Hepatic Neg liver ROS, GERD  Medicated and Controlled,  Endo/Other  neg diabetes  Renal/GU negative Renal ROS     Musculoskeletal   Abdominal   Peds  Hematology  (+) anemia ,   Anesthesia Other Findings   Reproductive/Obstetrics                             Anesthesia Physical Anesthesia Plan  ASA: III  Anesthesia Plan: MAC   Post-op Pain Management:    Induction:   PONV Risk Score and Plan:   Airway Management Planned: Nasal Cannula  Additional Equipment:   Intra-op Plan:   Post-operative Plan:   Informed Consent: I have reviewed the patients History and Physical, chart, labs and discussed the procedure including the risks, benefits and alternatives for the proposed anesthesia with the patient or authorized representative who has indicated his/her understanding and acceptance.     Plan Discussed with:   Anesthesia Plan Comments:         Anesthesia Quick Evaluation

## 2018-08-21 ENCOUNTER — Other Ambulatory Visit: Payer: Self-pay | Admitting: Family Medicine

## 2018-08-24 DIAGNOSIS — I739 Peripheral vascular disease, unspecified: Secondary | ICD-10-CM | POA: Diagnosis not present

## 2018-08-24 DIAGNOSIS — I2510 Atherosclerotic heart disease of native coronary artery without angina pectoris: Secondary | ICD-10-CM | POA: Diagnosis not present

## 2018-08-24 DIAGNOSIS — G609 Hereditary and idiopathic neuropathy, unspecified: Secondary | ICD-10-CM | POA: Diagnosis not present

## 2018-08-24 DIAGNOSIS — M6281 Muscle weakness (generalized): Secondary | ICD-10-CM | POA: Diagnosis not present

## 2018-08-24 DIAGNOSIS — I10 Essential (primary) hypertension: Secondary | ICD-10-CM | POA: Diagnosis not present

## 2018-08-24 DIAGNOSIS — J441 Chronic obstructive pulmonary disease with (acute) exacerbation: Secondary | ICD-10-CM | POA: Diagnosis not present

## 2018-08-24 DIAGNOSIS — L03116 Cellulitis of left lower limb: Secondary | ICD-10-CM | POA: Diagnosis not present

## 2018-08-24 DIAGNOSIS — J189 Pneumonia, unspecified organism: Secondary | ICD-10-CM | POA: Diagnosis not present

## 2018-08-24 DIAGNOSIS — M4180 Other forms of scoliosis, site unspecified: Secondary | ICD-10-CM | POA: Diagnosis not present

## 2018-08-24 DIAGNOSIS — L03115 Cellulitis of right lower limb: Secondary | ICD-10-CM | POA: Diagnosis not present

## 2018-08-24 DIAGNOSIS — R4182 Altered mental status, unspecified: Secondary | ICD-10-CM | POA: Diagnosis not present

## 2018-08-24 DIAGNOSIS — D509 Iron deficiency anemia, unspecified: Secondary | ICD-10-CM | POA: Diagnosis not present

## 2018-08-24 DIAGNOSIS — J449 Chronic obstructive pulmonary disease, unspecified: Secondary | ICD-10-CM | POA: Diagnosis not present

## 2018-08-25 DIAGNOSIS — J441 Chronic obstructive pulmonary disease with (acute) exacerbation: Secondary | ICD-10-CM | POA: Diagnosis not present

## 2018-08-25 DIAGNOSIS — L03115 Cellulitis of right lower limb: Secondary | ICD-10-CM | POA: Diagnosis not present

## 2018-08-25 DIAGNOSIS — I10 Essential (primary) hypertension: Secondary | ICD-10-CM | POA: Diagnosis not present

## 2018-08-25 DIAGNOSIS — D509 Iron deficiency anemia, unspecified: Secondary | ICD-10-CM | POA: Diagnosis not present

## 2018-08-25 DIAGNOSIS — L03116 Cellulitis of left lower limb: Secondary | ICD-10-CM | POA: Diagnosis not present

## 2018-08-25 DIAGNOSIS — G609 Hereditary and idiopathic neuropathy, unspecified: Secondary | ICD-10-CM | POA: Diagnosis not present

## 2018-08-25 DIAGNOSIS — M4180 Other forms of scoliosis, site unspecified: Secondary | ICD-10-CM | POA: Diagnosis not present

## 2018-08-25 DIAGNOSIS — I2510 Atherosclerotic heart disease of native coronary artery without angina pectoris: Secondary | ICD-10-CM | POA: Diagnosis not present

## 2018-08-25 DIAGNOSIS — I739 Peripheral vascular disease, unspecified: Secondary | ICD-10-CM | POA: Diagnosis not present

## 2018-08-26 DIAGNOSIS — L03116 Cellulitis of left lower limb: Secondary | ICD-10-CM | POA: Diagnosis not present

## 2018-08-26 DIAGNOSIS — J441 Chronic obstructive pulmonary disease with (acute) exacerbation: Secondary | ICD-10-CM | POA: Diagnosis not present

## 2018-08-26 DIAGNOSIS — L03115 Cellulitis of right lower limb: Secondary | ICD-10-CM | POA: Diagnosis not present

## 2018-08-26 DIAGNOSIS — D509 Iron deficiency anemia, unspecified: Secondary | ICD-10-CM | POA: Diagnosis not present

## 2018-08-26 DIAGNOSIS — I10 Essential (primary) hypertension: Secondary | ICD-10-CM

## 2018-08-26 DIAGNOSIS — G609 Hereditary and idiopathic neuropathy, unspecified: Secondary | ICD-10-CM | POA: Diagnosis not present

## 2018-08-26 DIAGNOSIS — M4180 Other forms of scoliosis, site unspecified: Secondary | ICD-10-CM | POA: Diagnosis not present

## 2018-08-26 DIAGNOSIS — I739 Peripheral vascular disease, unspecified: Secondary | ICD-10-CM | POA: Diagnosis not present

## 2018-08-26 DIAGNOSIS — I2510 Atherosclerotic heart disease of native coronary artery without angina pectoris: Secondary | ICD-10-CM | POA: Diagnosis not present

## 2018-08-27 DIAGNOSIS — L03115 Cellulitis of right lower limb: Secondary | ICD-10-CM | POA: Diagnosis not present

## 2018-08-27 DIAGNOSIS — D509 Iron deficiency anemia, unspecified: Secondary | ICD-10-CM | POA: Diagnosis not present

## 2018-08-27 DIAGNOSIS — M4180 Other forms of scoliosis, site unspecified: Secondary | ICD-10-CM | POA: Diagnosis not present

## 2018-08-27 DIAGNOSIS — I739 Peripheral vascular disease, unspecified: Secondary | ICD-10-CM | POA: Diagnosis not present

## 2018-08-27 DIAGNOSIS — L03116 Cellulitis of left lower limb: Secondary | ICD-10-CM | POA: Diagnosis not present

## 2018-08-27 DIAGNOSIS — J441 Chronic obstructive pulmonary disease with (acute) exacerbation: Secondary | ICD-10-CM | POA: Diagnosis not present

## 2018-08-27 DIAGNOSIS — I2510 Atherosclerotic heart disease of native coronary artery without angina pectoris: Secondary | ICD-10-CM | POA: Diagnosis not present

## 2018-08-27 DIAGNOSIS — G609 Hereditary and idiopathic neuropathy, unspecified: Secondary | ICD-10-CM | POA: Diagnosis not present

## 2018-08-27 DIAGNOSIS — I10 Essential (primary) hypertension: Secondary | ICD-10-CM | POA: Diagnosis not present

## 2018-08-28 DIAGNOSIS — L03116 Cellulitis of left lower limb: Secondary | ICD-10-CM | POA: Diagnosis not present

## 2018-08-28 DIAGNOSIS — G609 Hereditary and idiopathic neuropathy, unspecified: Secondary | ICD-10-CM | POA: Diagnosis not present

## 2018-08-28 DIAGNOSIS — J441 Chronic obstructive pulmonary disease with (acute) exacerbation: Secondary | ICD-10-CM | POA: Diagnosis not present

## 2018-08-28 DIAGNOSIS — I739 Peripheral vascular disease, unspecified: Secondary | ICD-10-CM | POA: Diagnosis not present

## 2018-08-28 DIAGNOSIS — M4180 Other forms of scoliosis, site unspecified: Secondary | ICD-10-CM | POA: Diagnosis not present

## 2018-08-28 DIAGNOSIS — L03115 Cellulitis of right lower limb: Secondary | ICD-10-CM | POA: Diagnosis not present

## 2018-08-28 DIAGNOSIS — I2510 Atherosclerotic heart disease of native coronary artery without angina pectoris: Secondary | ICD-10-CM | POA: Diagnosis not present

## 2018-08-28 DIAGNOSIS — D509 Iron deficiency anemia, unspecified: Secondary | ICD-10-CM | POA: Diagnosis not present

## 2018-08-28 DIAGNOSIS — I10 Essential (primary) hypertension: Secondary | ICD-10-CM | POA: Diagnosis not present

## 2018-09-01 DIAGNOSIS — I10 Essential (primary) hypertension: Secondary | ICD-10-CM | POA: Diagnosis not present

## 2018-09-01 DIAGNOSIS — L03116 Cellulitis of left lower limb: Secondary | ICD-10-CM | POA: Diagnosis not present

## 2018-09-01 DIAGNOSIS — L03115 Cellulitis of right lower limb: Secondary | ICD-10-CM | POA: Diagnosis not present

## 2018-09-01 DIAGNOSIS — I739 Peripheral vascular disease, unspecified: Secondary | ICD-10-CM | POA: Diagnosis not present

## 2018-09-01 DIAGNOSIS — I2510 Atherosclerotic heart disease of native coronary artery without angina pectoris: Secondary | ICD-10-CM | POA: Diagnosis not present

## 2018-09-01 DIAGNOSIS — D509 Iron deficiency anemia, unspecified: Secondary | ICD-10-CM | POA: Diagnosis not present

## 2018-09-01 DIAGNOSIS — M4180 Other forms of scoliosis, site unspecified: Secondary | ICD-10-CM | POA: Diagnosis not present

## 2018-09-01 DIAGNOSIS — J441 Chronic obstructive pulmonary disease with (acute) exacerbation: Secondary | ICD-10-CM | POA: Diagnosis not present

## 2018-09-01 DIAGNOSIS — M47816 Spondylosis without myelopathy or radiculopathy, lumbar region: Secondary | ICD-10-CM | POA: Insufficient documentation

## 2018-09-01 DIAGNOSIS — G609 Hereditary and idiopathic neuropathy, unspecified: Secondary | ICD-10-CM | POA: Diagnosis not present

## 2018-09-01 NOTE — Patient Instructions (Signed)
____________________________________________________________________________________________  Drug Holidays (Slow)  What is a "Drug Holiday"? Drug Holiday: is the name given to the period of time during which a patient stops taking a medication(s) for the purpose of eliminating tolerance to the drug.  Benefits . Improved effectiveness of opioids. . Decreased opioid dose needed to achieve benefits. . Improved pain with lesser dose.  What is tolerance? Tolerance: is the progressive decreased in effectiveness of a drug due to its repetitive use. With repetitive use, the body gets use to the medication and as a consequence, it loses its effectiveness. This is a common problem seen with opioid pain medications. As a result, a larger dose of the drug is needed to achieve the same effect that used to be obtained with a smaller dose.  How long should a "Drug Holiday" last? At least 14 consecutive days. (2 weeks)  What are withdrawals? Withdrawals: refers to the wide range of symptoms that occur after stopping or dramatically reducing opiate drugs after heavy and prolonged use. Withdrawal symptoms do not occur to patients that use low dose opioids, or those who take the medication sporadically. Contrary to benzodiazepine (example: Valium, Xanax, etc.) or alcohol withdrawals ("Delirium Tremens"), opioid withdrawals are not lethal. Withdrawals are the physical manifestation of the body getting rid of the excess receptors.  Expected Symptoms Early symptoms of withdrawal may include: . Agitation . Anxiety . Muscle aches . Increased tearing . Insomnia . Runny nose . Sweating . Yawning  Late symptoms of withdrawal may include: . Abdominal cramping . Diarrhea . Dilated pupils . Goose bumps . Nausea . Vomiting  Will I experience withdrawals? Due to the slow nature of the taper, it is very unlikely that you will experience any.  What is a slow taper? Taper: refers to the gradual decrease in  dose. ___________________________________________________________________________________________ ____________________________________________________________________________________________  Medication Rules  Applies to: All patients receiving prescriptions (written or electronic).  Pharmacy of record: Pharmacy where electronic prescriptions will be sent. If written prescriptions are taken to a different pharmacy, please inform the nursing staff. The pharmacy listed in the electronic medical record should be the one where you would like electronic prescriptions to be sent.  Prescription refills: Only during scheduled appointments. Applies to both, written and electronic prescriptions.  NOTE: The following applies primarily to controlled substances (Opioid* Pain Medications).   Patient's responsibilities: 1. Pain Pills: Bring all pain pills to every appointment (except for procedure appointments). 2. Pill Bottles: Bring pills in original pharmacy bottle. Always bring newest bottle. Bring bottle, even if empty. 3. Medication refills: You are responsible for knowing and keeping track of what medications you need refilled. The day before your appointment, write a list of all prescriptions that need to be refilled. Bring that list to your appointment and give it to the admitting nurse. Prescriptions will be written only during appointments. If you forget a medication, it will not be "Called in", "Faxed", or "electronically sent". You will need to get another appointment to get these prescribed. 4. Prescription Accuracy: You are responsible for carefully inspecting your prescriptions before leaving our office. Have the discharge nurse carefully go over each prescription with you, before taking them home. Make sure that your name is accurately spelled, that your address is correct. Check the name and dose of your medication to make sure it is accurate. Check the number of pills, and the written  instructions to make sure they are clear and accurate. Make sure that you are given enough medication to last until your next  medication refill appointment. 5. Taking Medication: Take medication as prescribed. Never take more pills than instructed. Never take medication more frequently than prescribed. Taking less pills or less frequently is permitted and encouraged, when it comes to controlled substances (written prescriptions).  6. Inform other Doctors: Always inform, all of your healthcare providers, of all the medications you take. 7. Pain Medication from other Providers: You are not allowed to accept any additional pain medication from any other Doctor or Healthcare provider. There are two exceptions to this rule. (see below) In the event that you require additional pain medication, you are responsible for notifying us, as stated below. 8. Medication Agreement: You are responsible for carefully reading and following our Medication Agreement. This must be signed before receiving any prescriptions from our practice. Safely store a copy of your signed Agreement. Violations to the Agreement will result in no further prescriptions. (Additional copies of our Medication Agreement are available upon request.) 9. Laws, Rules, & Regulations: All patients are expected to follow all [address] and [employer], Rules, [employer]. Ignorance of the Laws does not constitute a valid excuse. The use of any illegal substances is prohibited. 10. Adopted CDC guidelines & recommendations: Target dosing levels will be at or below 60 MME/day. Use of benzodiazepines** is not recommended.  Exceptions: There are only two exceptions to the rule of not receiving pain medications from other Healthcare Providers. 1. Exception #1 (Emergencies): In the event of an emergency (i.e.: accident requiring emergency care), you are allowed to receive additional pain medication. However, you are responsible for: As soon as you are  able, call our office [phone], at any time of the day or night, and leave a message stating your name, the date and nature of the emergency, and the name and dose of the medication prescribed. In the event that your call is answered by a member of our staff, make sure to document and save the date, time, and the name of the person that took your information.  2. Exception #2 (Planned Surgery): In the event that you are scheduled by another doctor or dentist to have any type of surgery or procedure, you are allowed (for a period no longer than 30 days), to receive additional pain medication, for the acute post-op pain. However, in this case, you are responsible for picking up a copy of our "Post-op Pain Management for Surgeons" handout, and giving it to your surgeon or dentist. This document is available at our office, and does not require an appointment to obtain it. Simply go to our office during business hours (Monday-Thursday from 8:00 AM to 4:00 PM) (Friday 8:00 AM to 12:00 Noon) or if you have a scheduled appointment with [place], prior to your surgery, and ask for it by name. In addition, you will need to provide [place] with your name, name of your surgeon, type of surgery, and date of procedure or surgery.  *Opioid medications include: morphine, codeine, oxycodone, oxymorphone, hydrocodone, hydromorphone, meperidine, tramadol, tapentadol, buprenorphine, fentanyl, methadone. **Benzodiazepine medications include: diazepam (Valium), alprazolam (Xanax), clonazepam (Klonopine), lorazepam (Ativan), clorazepate (Tranxene), chlordiazepoxide (Librium), estazolam (Prosom), oxazepam (Serax), temazepam (Restoril), triazolam (Halcion) (Last updated: 01/22/2018) ____________________________________________________________________________________________   ____________________________________________________________________________________________  CANNABIDIOL (AKA: CBD Oil or Pills)  Applies to: All patients  receiving prescriptions of controlled substances (written and/or electronic).  General Information: Cannabidiol (CBD) was discovered in 57. It is one of some 113 identified cannabinoids in cannabis (Marijuana) plants, accounting for up to 40% of the plant's extract. As of 2018,  preliminary clinical research on cannabidiol included studies of anxiety, cognition, movement disorders, and pain.  Cannabidiol is consummed in multiple ways, including inhalation of cannabis smoke or vapor, as an aerosol spray into the cheek, and by mouth. It may be supplied as CBD oil containing CBD as the active ingredient (no added tetrahydrocannabinol (THC) or terpenes), a full-plant CBD-dominant hemp extract oil, capsules, dried cannabis, or as a liquid solution. CBD is thought not have the same psychoactivity as THC, and may affect the actions of THC. Studies suggest that CBD may interact with different biological targets, including cannabinoid receptors and other neurotransmitter receptors. As of 2018 the mechanism of action for its biological effects has not been determined.  In the [place], cannabidiol has a limited approval by the Food and Drug Administration (FDA) for treatment of only two types of epilepsy disorders. The side effects of long-term use of the drug include somnolence, decreased appetite, diarrhea, fatigue, malaise, weakness, sleeping problems, and others.  CBD remains a Schedule I drug prohibited for any use.  Legality: Some manufacturers ship CBD products nationally, an illegal action which the FDA has not enforced in 2018, with CBD remaining the subject of an FDA investigational new drug evaluation, and is not considered legal as a dietary supplement or food ingredient as of December 2018. Federal illegality has made it difficult historically to conduct research on CBD. CBD is openly sold in head shops and health food stores in some states where such sales have not been explicitly  legalized.  Warning: Because it is not FDA approved for general use or treatment of pain, it is not required to undergo the same manufacturing controls as prescription drugs.  This means that the available cannabidiol (CBD) may be contaminated with THC.  If this is the case, it will trigger a positive urine drug screen (UDS) test for cannabinoids (Marijuana).  Because a positive UDS for illicit substances is a violation of our medication agreement, your opioid analgesics (pain medicine) may be permanently discontinued. (Last update: 02/12/2018) ____________________________________________________________________________________________   ____________________________________________________________________________________________  Preparing for Procedure with Sedation  Instructions: . Oral Intake: Do not eat or drink anything for at least 8 hours prior to your procedure. . Transportation: Public transportation is not allowed. Bring an adult driver. The driver must be physically present in our waiting room before any procedure can be started. Marland Kitchen Physical Assistance: Bring an adult physically capable of assisting you, in the event you need help. This adult should keep you company at home for at least 6 hours after the procedure. . Blood Pressure Medicine: Take your blood pressure medicine with a sip of water the morning of the procedure. . Blood thinners: Notify our staff if you are taking any blood thinners. Depending on which one you take, there will be specific instructions on how and when to stop it. . Diabetics on insulin: Notify the staff so that you can be scheduled 1st case in the morning. If your diabetes requires high dose insulin, take only  of your normal insulin dose the morning of the procedure and notify the staff that you have done so. . Preventing infections: Shower with an antibacterial soap the morning of your procedure. . Build-up your immune system: Take 1000 mg of Vitamin C with  every meal (3 times a day) the day prior to your procedure. Marland Kitchen Antibiotics: Inform the staff if you have a condition or reason that requires you to take antibiotics before dental procedures. . Pregnancy: If you are pregnant, call  and cancel the procedure. . Sickness: If you have a cold, fever, or any active infections, call and cancel the procedure. . Arrival: You must be in the facility at least 30 minutes prior to your scheduled procedure. . Children: Do not bring children with you. . Dress appropriately: Bring dark clothing that you would not mind if they get stained. . Valuables: Do not bring any jewelry or valuables.  Procedure appointments are reserved for interventional treatments only. Marland Kitchen No Prescription Refills. . No medication changes will be discussed during procedure appointments. . No disability issues will be discussed.  Reasons to call and reschedule or cancel your procedure: (Following these recommendations will minimize the risk of a serious complication.) . Surgeries: Avoid having procedures within 2 weeks of any surgery. (Avoid for 2 weeks before or after any surgery). . Flu Shots: Avoid having procedures within 2 weeks of a flu shots or . (Avoid for 2 weeks before or after immunizations). . Barium: Avoid having a procedure within 7-10 days after having had a radiological study involving the use of radiological contrast. (Myelograms, Barium swallow or enema study). . Heart attacks: Avoid any elective procedures or surgeries for the initial 6 months after a "Myocardial Infarction" (Heart Attack). . Blood thinners: It is imperative that you stop these medications before procedures. Let us know if you if you take any blood thinner.  . Infection: Avoid procedures during or within two weeks of an infection (including chest colds or gastrointestinal problems). Symptoms associated with infections include: Localized redness, fever, chills, night sweats or profuse sweating, burning  sensation when voiding, cough, congestion, stuffiness, runny nose, sore throat, diarrhea, nausea, vomiting, cold or Flu symptoms, recent or current infections. It is specially important if the infection is over the area that we intend to treat. Marland Kitchen Heart and lung problems: Symptoms that may suggest an active cardiopulmonary problem include: cough, chest pain, breathing difficulties or shortness of breath, dizziness, ankle swelling, uncontrolled high or unusually low blood pressure, and/or palpitations. If you are experiencing any of these symptoms, cancel your procedure and contact your primary care physician for an evaluation.  Remember:  Regular Business hours are:  Monday to Thursday 8:00 AM to 4:00 PM  Provider's Schedule: Delano Metz, MD:  Procedure days: Tuesday and Thursday 7:30 AM to 4:00 PM  Edward Jolly, MD:  Procedure days: Monday and Wednesday 7:30 AM to 4:00 PM ____________________________________________________________________________________________

## 2018-09-01 NOTE — Progress Notes (Signed)
Patient's Name: Cheryl [name]  MRN: [id number]  Referring Provider: Birdie Sons, MD  DOB: 1951-05-26  PCP: Birdie Sons, MD  DOS: 09/02/2018  Note by: Gaspar Cola, MD  Service setting: Ambulatory outpatient  Specialty: Interventional Pain Management  Location: [facility] (AMB) Pain Management Facility    Patient type: Established   Primary Reason(s) for Visit: Encounter for prescription drug management. (Level of risk: moderate)  CC: Leg Pain  HPI  Ms. Teehan is a 67 y.o. year old, female patient, who comes today for a medication management evaluation. She has Essential hypertension; GERD (gastroesophageal reflux disease); Hyperlipemia; Anxiety; Airway hyperreactivity; Chronic thoracic back pain; Coronary artery disease; Excessive falling; Alteration in bowel elimination: incontinence; Insomnia; Decreased testosterone level; Leg weakness; Menopausal symptom; Migraine; Neuropathy ([place]); Fecal occult blood test positive; OP (osteoporosis); Panic disorder; Compulsive tobacco user syndrome; Urinary incontinence; Vitamin D deficiency; Weight loss; COPD with acute exacerbation ([place]); Dementia ([place]); Lumbar radicular pain (Bilateral) (L>R) (L4); Chronic low back pain (Primary Source of Pain) (Bilateral) (L>R); Encounter for therapeutic drug level monitoring; Uncomplicated opioid dependence ([place]); Chronic pain syndrome; Peripheral nerve disease; Peripheral vascular disease ([place]); Platelet inhibition due to Plavix; Abnormal mammogram of right breast; Dilated intrahepatic bile duct; Acute on chronic respiratory failure with hypoxia ([place]); Dysphagia; Acute diastolic CHF (congestive heart failure) ([place]); Leukocytosis; Long term current use of opiate analgesic; Fibromyalgia; Osteoarthrosis; Long term current use of anticoagulant therapy (Plavix); Opiate use (160 MME/Day); Anemia; Lumbar facet syndrome (Bilateral) (L>R); Lumbar spondylosis; Nausea with vomiting; Protein-calorie malnutrition, severe; Metabolic  encephalopathy; Hypokalemia; Pneumonia; Marijuana use; Rib fracture; Generalized weakness; Hypoxia; Gastritis without bleeding; Acute on chronic respiratory failure ([place]); Iron deficiency anemia; Acute encephalopathy; Goals of care, counseling/discussion; Palliative care by specialist; Depression due to physical illness; Anorexia; Pharmacologic therapy; Spondylosis without myelopathy or radiculopathy, lumbar region; Disorder of skeletal system; Problems influencing health status; DDD (degenerative disc disease), lumbar; Abnormal CT scan, lumbar spine (08/04/2017); Levoscoliosis (Severe); 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 Her primarily concern today is the Leg Pain  Pain Assessment: Location: Right, Left, Lower Leg(legs and back) Onset: More than a month ago Duration: Chronic pain Quality: Throbbing Severity: 5 /10 (subjective, self-reported pain score)  Note: Reported level is compatible with observation.                         When using our objective Pain Scale, levels between 6 and 10/10 are said to belong in an emergency room, as it progressively worsens from a 6/10, described as severely limiting, requiring emergency care not usually available at an outpatient pain management facility. At a 6/10 level, communication becomes difficult and requires great effort. Assistance to reach the emergency department may be required. Facial flushing and profuse sweating along with potentially dangerous increases in heart rate and blood pressure will be evident. Effect on ADL: limits my daily  activities Timing: Constant Modifying factors: medications, elevate legs BP: (!) 79/67  HR: 74  Ms. Samons was last scheduled for an appointment on Visit date not found for medication management. During today's appointment we reviewed Ms. Markin's chronic pain status, as well as her outpatient medication regimen.  The patient  reports that she does not use drugs. Her body mass index is 22.15 kg/m.  Further details on both, my assessment(s), as well as the proposed treatment plan, please see below.  Controlled Substance Pharmacotherapy Assessment REMS (Risk Evaluation and Mitigation Strategy)  Analgesic: Duragesic 50 mcg/h every 72 hours  MME/day:120 mg/day.   [name], RN  09/02/2018  9:02 AM  Sign at close encounter Nursing Pain Medication Assessment:  Safety precautions to be maintained throughout the outpatient stay will include: orient to surroundings, keep bed in low position, maintain call bell within reach at all times, provide assistance with transfer out of bed and ambulation.  Medication Inspection Compliance: Pill count conducted under aseptic conditions, in front of the patient. Neither the pills nor the bottle was removed from the patient's sight at any time. Once count was completed pills were immediately returned to the patient in their original bottle.  Medication: Fentanyl patch Pill/Patch Count: 10 of 10 pills remain Pill/Patch Appearance: Markings consistent with prescribed medication Bottle Appearance: Standard pharmacy container. Clearly labeled. Filled Date: 10 /08 / 2018 Last Medication intake:  Yesterday   Pharmacokinetics: Liberation and absorption (onset of action): WNL Distribution (time to peak effect): WNL Metabolism and excretion (duration of action): WNL         Pharmacodynamics: Desired effects: Analgesia: Ms. Alegria reports >50% benefit. Functional ability: Patient reports that medication allows her to accomplish basic ADLs Clinically  meaningful improvement in function (CMIF): Sustained CMIF goals met Perceived effectiveness: Described as relatively effective, allowing for increase in activities of daily living (ADL) Undesirable effects: Side-effects or Adverse reactions: None reported Monitoring: [place] PMP: Online review of the past 7-monthperiod conducted. Compliant with practice rules and regulations Last UDS on record: Summary  Date Value Ref Range Status  08/03/2018 FINAL  Final    Comment:    ==================================================================== TOXASSURE SELECT 13 (MW) ==================================================================== Test                             Result       Flag       Units Drug Present and Declared for Prescription Verification   Alprazolam                     47           EXPECTED   ng/mg creat   Alpha-hydroxyalprazolam        469          EXPECTED   ng/mg creat    Source of alprazolam is a scheduled prescription medication.    Alpha-hydroxyalprazolam is an expected metabolite of alprazolam.   Fentanyl                       15           EXPECTED   ng/mg creat   Norfentanyl                    204          EXPECTED   ng/mg creat    Source of fentanyl is a scheduled prescription medication,    including IV, patch, and transmucosal formulations. Norfentanyl    is an expected metabolite of fentanyl. Drug Present not Declared for Prescription Verification   Tramadol                       >3205  UNEXPECTED ng/mg creat   O-Desmethyltramadol            >3205        UNEXPECTED ng/mg creat   N-Desmethyltramadol            1101         UNEXPECTED ng/mg creat    Source of tramadol is a prescription medication.    O-desmethyltramadol and N-desmethyltramadol are expected    metabolites of tramadol. ==================================================================== Test                      Result    Flag   Units      Ref Range   Creatinine              156               mg/dL      >=20 ==================================================================== Declared Medications:  The flagging and interpretation on this report are based on the  following declared medications.  Unexpected results may arise from  inaccuracies in the declared medications.  **Note: The testing scope of this panel includes these medications:  Alprazolam (Xanax)  Fentanyl (Duragesic)  **Note: The testing scope of this panel does not include following  reported medications:  Acetaminophen  Albuterol (Proventil)  Albuterol (Ventolin HFA)  Alendronate (Fosamax)  Atropine (Lomotil)  Celecoxib (Celebrex)  Clopidogrel (Plavix)  Cyanocobalamin  Diphenoxylate (Lomotil)  Donepezil (Aricept)  Doxycycline  Estradiol (Estrace)  Famotidine (Pepcid)  Fluticasone (Advair)  Iron (Ferrous Sulfate)  Naloxone (Narcan)  Pantoprazole (Protonix)  Pregabalin (Lyrica)  Salmeterol (Advair)  Simvastatin (Zocor)  Suvorexant  Tiotropium (Spiriva)  Topical (Silvadene)  Triamcinolone (Kenalog) ==================================================================== For clinical consultation, please call [phone]. ====================================================================    UDS interpretation: Compliant          Medication Assessment Form: Reviewed. Patient indicates being compliant with therapy Treatment compliance: Compliant Risk Assessment Profile: Aberrant behavior: See prior evaluations. None observed or detected today Comorbid factors increasing risk of overdose: See prior notes. No additional risks detected today Opioid risk tool (ORT) (Total Score): 3 Personal History of Substance Abuse (SUD-Substance use disorder):  Alcohol: Negative  Illegal Drugs: Negative  Rx Drugs: Negative  ORT Risk Level calculation: Low Risk Risk of substance use disorder (SUD): Low Opioid Risk Tool - 09/02/18 0842      Family History of Substance Abuse   Alcohol  Positive Female    Illegal  Drugs  Negative    Rx Drugs  Negative      Personal History of Substance Abuse   Alcohol  Negative    Illegal Drugs  Negative    Rx Drugs  Negative      Age   Age between 33-45 years   No      History of Preadolescent Sexual Abuse   History of Preadolescent Sexual Abuse  Negative or Female      Psychological Disease   Psychological Disease  Negative    Depression  Negative      Total Score   Opioid Risk Tool Scoring  3    Opioid Risk Interpretation  Low Risk      ORT Scoring interpretation table:  Score <3 = Low Risk for SUD  Score between 4-7 = Moderate Risk for SUD  Score >8 = High Risk for Opioid Abuse   Risk Mitigation Strategies:  Patient Counseling: Covered Patient-Prescriber Agreement (PPA): Present and active  Notification to other healthcare providers: Done  Pharmacologic Plan: No change in therapy,  at this time.             Laboratory Chemistry  Inflammation Markers (CRP: Acute Phase) (ESR: Chronic Phase) Lab Results  Component Value Date   CRP 0.6 03/01/2016   ESRSEDRATE 52 (H) 03/01/2016   LATICACIDVEN 1.0 06/18/2018                        Active Opioid Contract: with [facility]. Avoid prescribing benzodiazepines or any other CNS depressant. Do NOT DELETE any opioid prescriptions from Pain Management (Dr. Dossie Arbour) during Medication reconciliation. Rheumatology Markers Lab Results  Component Value Date   LABURIC 2.7 05/21/2018                        Renal Function Markers Lab Results  Component Value Date   BUN 6 (L) 06/20/2018   CREATININE 0.65 06/22/2018   BCR 14 04/21/2018   GFRAA >60 06/22/2018   GFRNONAA >60 06/22/2018                             Hepatic Function Markers Lab Results  Component Value Date   AST 21 06/18/2018   ALT 23 06/18/2018   ALBUMIN 3.5 06/18/2018   ALKPHOS 178 (H) 06/18/2018   AMYLASE 30 10/17/2015   LIPASE 22 05/18/2018   AMMONIA <9 (L) 09/12/2017  - Normal ALP (Alkaline phosphatase) levels are between  35 -105 IU/L, for our Lab. High ALP could suggest liver damage or increased bone cell activity. If other tests such as bilirubin, aspartate aminotransferase (AST), or alanine aminotransferase (ALT) are also high, usually the increased ALP is coming from the liver. Higher-than-normal ALP levels can be seen with: biliary obstruction; bone conditions; osteoblastic bone tumors; osteomalacia; a healing fracture; liver disease; hepatitis; eating a fatty meal if you have blood type O or B; hyperparathyroidism; leukemia; lymphoma; Paget disease; rickets; and/or sarcoidosis.  Electrolytes Lab Results  Component Value Date   NA 144 06/20/2018   K 3.3 (L) 06/20/2018   CL 107 06/20/2018   CALCIUM 7.7 (L) 06/20/2018   MG 2.0 06/20/2018   PHOS 4.5 04/21/2018                        Neuropathy Markers Lab Results  Component Value Date   VITAMINB12 1,830 (H) 07/24/2018   FOLATE 8.9 06/02/2018   HGBA1C 5.5 04/30/2017   HIV Non Reactive 05/15/2018                        CNS Tests No results found.  Bone Pathology Markers Lab Results  Component Value Date   VD25OH 25.6 (L) 01/17/2016   [id number] 27 07/04/2017   [id number] 24 07/04/2017   [id number] <10 07/04/2017                         Coagulation Parameters Lab Results  Component Value Date   INR 1.03 06/01/2018   LABPROT 13.4 06/01/2018   APTT 38 (H) 10/17/2015   PLT 236 07/24/2018   DDIMER  10/30/2009    0.32        AT THE INHOUSE ESTABLISHED CUTOFF VALUE OF 0.48 ug/mL FEU, THIS ASSAY HAS BEEN DOCUMENTED IN THE LITERATURE TO HAVE A SENSITIVITY AND NEGATIVE PREDICTIVE VALUE OF AT LEAST 98 TO 99%.  THE TEST RESULT SHOULD BE CORRELATED WITH AN  ASSESSMENT OF THE CLINICAL PROBABILITY OF DVT / VTE.                        Cardiovascular Markers Lab Results  Component Value Date   BNP 137.0 (H) 05/14/2017   CKTOTAL 65 04/03/2013   CKMB 1.4 04/03/2013   TROPONINI 0.11 (HH) 06/18/2018   HGB 10.4 (L) 07/24/2018   HCT 31.8 (L)  07/24/2018                         CA Markers No results found.  Note: Lab results reviewed.  Recent Diagnostic Imaging Results  CT Cervical Spine Wo Contrast CLINICAL DATA:  Atraumatic headache and neck pain since this morning.  EXAM: CT HEAD WITHOUT CONTRAST  CT CERVICAL SPINE WITHOUT CONTRAST  TECHNIQUE: Multidetector CT imaging of the head and cervical spine was performed following the standard protocol without intravenous contrast. Multiplanar CT image reconstructions of the cervical spine were also generated.  COMPARISON:  06/18/2018 head CT.  01/26/2013 cervical spine CT  FINDINGS: CT HEAD FINDINGS  Brain: No evidence of parenchymal hemorrhage or extra-axial fluid collection. No mass lesion, mass effect, or midline shift. No CT evidence of acute infarction. Nonspecific mild stable subcortical and periventricular white matter hypodensity, most in keeping with chronic small vessel ischemic change. Cerebral volume is age appropriate. No ventriculomegaly.  Vascular: No acute abnormality.  Skull: No evidence of calvarial fracture.  Sinuses/Orbits: No fluid levels. Mucoperiosteal thickening in the right greater than left ethmoidal air cells.  Other:  The mastoid air cells are unopacified.  CT CERVICAL SPINE FINDINGS  Alignment: Reversal of the normal cervical lordosis. No facet subluxation. Dens is well positioned between the lateral masses of C1. There is 2 mm anterolisthesis at C3-4, 3 mm anterolisthesis at C4-5, 3 mm retrolisthesis at C5-6 and 2 mm retrolisthesis at C6-7, all worsened since 01/26/2013.  Skull base and vertebrae: No acute fracture. No primary bone lesion or focal pathologic process.  Soft tissues and spinal canal: No prevertebral edema. No visible canal hematoma.  Disc levels: Severe degenerative disc disease at C5-6 and C6-7 with eburnation at these levels, worsened. Mild degenerative disc disease at C4-5. Advanced bilateral facet  arthropathy. Mild degenerative foraminal stenosis bilaterally at C5-6 and on the left at C6-7. Mild effacement of the anterior cervical canal at C5-6 by posterior disc osteophytes.  Upper chest: No acute abnormality.  Other: Visualized mastoid air cells appear clear. Hypodense 1.2 cm anterior right thyroid lobe nodule. No pathologically enlarged cervical nodes.  IMPRESSION: CT HEAD:  1.  No evidence of acute intracranial abnormality. 2. Mild chronic small vessel ischemic changes in the cerebral white matter. 3. Mild paranasal sinusitis, chronic appearing.  CT CERVICAL SPINE:  1. Severe degenerative disc disease at C5-6 and C6-7 with eburnation at these levels, worsened. 2. Reversal of the normal cervical lordosis. Multilevel spondylolisthesis in the cervical spine as detailed, worsened. If there is clinical concern for ligamentous instability, recommend cervical spine MRI. 3. Advanced bilateral facet arthropathy. Mild degenerative foraminal stenosis as detailed.  Electronically Signed   By: Ilona Sorrel M.D.   On: 08/17/2018 21:38 CT Head Wo Contrast CLINICAL DATA:  Atraumatic headache and neck pain since this morning.  EXAM: CT HEAD WITHOUT CONTRAST  CT CERVICAL SPINE WITHOUT CONTRAST  TECHNIQUE: Multidetector CT imaging of the head and cervical spine was performed following the standard protocol without intravenous contrast. Multiplanar CT image reconstructions of the cervical spine  were also generated.  COMPARISON:  06/18/2018 head CT.  01/26/2013 cervical spine CT  FINDINGS: CT HEAD FINDINGS  Brain: No evidence of parenchymal hemorrhage or extra-axial fluid collection. No mass lesion, mass effect, or midline shift. No CT evidence of acute infarction. Nonspecific mild stable subcortical and periventricular white matter hypodensity, most in keeping with chronic small vessel ischemic change. Cerebral volume is age appropriate. No  ventriculomegaly.  Vascular: No acute abnormality.  Skull: No evidence of calvarial fracture.  Sinuses/Orbits: No fluid levels. Mucoperiosteal thickening in the right greater than left ethmoidal air cells.  Other:  The mastoid air cells are unopacified.  CT CERVICAL SPINE FINDINGS  Alignment: Reversal of the normal cervical lordosis. No facet subluxation. Dens is well positioned between the lateral masses of C1. There is 2 mm anterolisthesis at C3-4, 3 mm anterolisthesis at C4-5, 3 mm retrolisthesis at C5-6 and 2 mm retrolisthesis at C6-7, all worsened since 01/26/2013.  Skull base and vertebrae: No acute fracture. No primary bone lesion or focal pathologic process.  Soft tissues and spinal canal: No prevertebral edema. No visible canal hematoma.  Disc levels: Severe degenerative disc disease at C5-6 and C6-7 with eburnation at these levels, worsened. Mild degenerative disc disease at C4-5. Advanced bilateral facet arthropathy. Mild degenerative foraminal stenosis bilaterally at C5-6 and on the left at C6-7. Mild effacement of the anterior cervical canal at C5-6 by posterior disc osteophytes.  Upper chest: No acute abnormality.  Other: Visualized mastoid air cells appear clear. Hypodense 1.2 cm anterior right thyroid lobe nodule. No pathologically enlarged cervical nodes.  IMPRESSION: CT HEAD:  1.  No evidence of acute intracranial abnormality. 2. Mild chronic small vessel ischemic changes in the cerebral white matter. 3. Mild paranasal sinusitis, chronic appearing.  CT CERVICAL SPINE:  1. Severe degenerative disc disease at C5-6 and C6-7 with eburnation at these levels, worsened. 2. Reversal of the normal cervical lordosis. Multilevel spondylolisthesis in the cervical spine as detailed, worsened. If there is clinical concern for ligamentous instability, recommend cervical spine MRI. 3. Advanced bilateral facet arthropathy. Mild degenerative foraminal stenosis  as detailed.  Electronically Signed   By: Ilona Sorrel M.D.   On: 08/17/2018 21:38  Complexity Note: Imaging results reviewed. Results shared with Ms. Highley, using Layman's terms.                         Meds   Current Outpatient Medications:  .  acetaminophen (TYLENOL) 325 MG tablet, Take 2 tablets (650 mg total) by mouth every 6 (six) hours as needed for mild pain (or Fever >/= 101)., Disp: , Rfl:  .  albuterol (PROVENTIL) (2.5 MG/3ML) 0.083% nebulizer solution, Take 3 mLs (2.5 mg total) by nebulization every 4 (four) hours as needed for wheezing., Disp: 50 vial, Rfl: 12 .  alendronate (FOSAMAX) 70 MG tablet, Take 1 tablet (70 mg total) by mouth every 7 (seven) days. Take with a full glass of water on an empty stomach. (Patient taking differently: Take 70 mg by mouth every Monday. Take with a full glass of water on an empty stomach.), Disp: 4 tablet, Rfl: 11 .  ALPRAZolam (XANAX) 0.5 MG tablet, Take 0.5-1 tablets (0.25-0.5 mg total) by mouth at bedtime as needed for anxiety., Disp: 30 tablet, Rfl: 5 .  baclofen (LIORESAL) 10 MG tablet, Take 1 tablet (10 mg total) by mouth 3 (three) times daily as needed for muscle spasms., Disp: 30 tablet, Rfl: 0 .  celecoxib (CELEBREX) 100 MG capsule,  Take 1 capsule (100 mg total) by mouth 2 (two) times daily as needed. (Patient taking differently: Take 100 mg by mouth 2 (two) times daily as needed for moderate pain. ), Disp: 60 capsule, Rfl: 2 .  clopidogrel (PLAVIX) 75 MG tablet, Take 75 mg by mouth daily., Disp: , Rfl:  .  diphenoxylate-atropine (LOMOTIL) 2.5-0.025 MG tablet, Take 1 tablet by mouth as needed for diarrhea or loose stools., Disp: , Rfl:  .  donepezil (ARICEPT) 10 MG tablet, Take 10 mg by mouth at bedtime., Disp: , Rfl:  .  estradiol (ESTRACE) 0.5 MG tablet, Take 1 tablet (0.5 mg total) by mouth every other day. (Take in place of estropipate), Disp: 30 tablet, Rfl: 3 .  [START ON 10/19/2018] fentaNYL (DURAGESIC - DOSED MCG/HR) 50 MCG/HR,  Place 1 patch (50 mcg total) onto the skin every 3 (three) days., Disp: 10 patch, Rfl: 0 .  ferrous sulfate 325 (65 FE) MG tablet, Take 1 tablet (325 mg total) by mouth 2 (two) times daily with a meal., Disp: 60 tablet, Rfl: 3 .  Fluticasone-Salmeterol (ADVAIR DISKUS) 250-50 MCG/DOSE AEPB, inhale 1 dose by mouth twice a day (Patient taking differently: Inhale 1 puff into the lungs 2 (two) times daily. inhale 1 dose by mouth twice a day), Disp: 60 each, Rfl: 2 .  NARCAN 4 MG/0.1ML LIQD nasal spray kit, Place 0.4 mg into the nose once. , Disp: , Rfl:  .  pantoprazole (PROTONIX) 40 MG tablet, Take 40 mg by mouth 2 (two) times daily. , Disp: , Rfl:  .  pregabalin (LYRICA) 150 MG capsule, Take 1 capsule (150 mg total) by mouth 3 (three) times daily. (Patient taking differently: Take 300 mg by mouth 2 (two) times daily. ), Disp: 90 capsule, Rfl: 2 .  silver sulfADIAZINE (SILVADENE) 1 % cream, Apply 1 application topically daily as needed (leg infection). , Disp: , Rfl:  .  simvastatin (ZOCOR) 10 MG tablet, Take 10 mg by mouth at bedtime. , Disp: , Rfl:  .  Suvorexant (BELSOMRA) 5 MG TABS, Take 1 tablet by mouth at bedtime as needed (for insomnia). (Patient taking differently: Take 1 tablet by mouth at bedtime. ), Disp: 30 tablet, Rfl: 3 .  tiotropium (SPIRIVA HANDIHALER) 18 MCG inhalation capsule, inhale the contents of one capsule in the handihaler once daily (Patient taking differently: Place 18 mcg into inhaler and inhale daily. inhale the contents of one capsule in the handihaler once daily), Disp: 30 capsule, Rfl: 12 .  triamcinolone (KENALOG) 0.025 % ointment, Apply 1 application topically 2 (two) times daily. (Patient taking differently: Apply 1 application topically 2 (two) times daily as needed (cracking skin). ), Disp: 15 g, Rfl: 0 .  VENTOLIN HFA 108 (90 Base) MCG/ACT inhaler, Inhale 2 puffs into the lungs every 4 (four) hours as needed for wheezing or shortness of breath., Disp: 18 g, Rfl: 5 .   vitamin B-12 (CYANOCOBALAMIN) 1000 MCG tablet, Take 1 tablet (1,000 mcg total) by mouth daily., Disp: 30 tablet, Rfl: 0  ROS  Constitutional: Denies any fever or chills Gastrointestinal: No reported hemesis, hematochezia, vomiting, or acute GI distress Musculoskeletal: Denies any acute onset joint swelling, redness, loss of ROM, or weakness Neurological: No reported episodes of acute onset apraxia, aphasia, dysarthria, agnosia, amnesia, paralysis, loss of coordination, or loss of consciousness  Allergies  Ms. Dejarnett is allergic to percocet [oxycodone-acetaminophen]; aspirin; codeine; propoxyphene; and sulfa antibiotics.  [place]  Drug: Ms. Lucero  reports that she does not use drugs. Alcohol:  reports that she does not drink alcohol. Tobacco:  reports that she has been smoking cigarettes. She has a 50.00 pack-year smoking history. She has never used smokeless tobacco. Medical:  has a past medical history of Acute postoperative pain (04/14/2017), Allergy, Anemia, Anxiety, Arthritis, Aspiration pneumonitis (HCC) (11/24/2015), Asthma, CHF (congestive heart failure) ([place]), Chronic pain, COPD (chronic obstructive pulmonary disease) ([place]), Coronary artery disease, DVT (deep venous thrombosis) ([place]), Dyspnea, Dysrhythmia, Encephalopathy, GERD (gastroesophageal reflux disease), Headache, Hyperlipidemia, Hypertension, Kyphoscoliosis deformity of spine, Migraines, Neuropathy (2010), Osteoporosis, Oxygen deficiency, Peripheral vascular disease ([place]), Pneumonia, Pneumonia (11/19/2015), Pneumonia (10/2016), Pneumonia, Vitamin D deficiency, and Wears dentures. Surgical: Ms. Balin  has a past surgical history that includes Appendectomy; Spine surgery; Foot surgery (Bilateral); cardiac catherization (10/31/2009); abdomnal aortic stent (05/30/2008); Abdominal hysterectomy (1975); Cholecystectomy (1972); Cervical fusion (C5 - 6/C6-7); Appendectomy; Colonoscopy with propofol (N/A, 07/27/2015); Esophagogastroduodenoscopy (egd)  with propofol (N/A, 07/27/2015); Esophagogastroduodenoscopy (egd) with propofol (N/A, 05/19/2018); Tonsillectomy; Back surgery; Cardiac catheterization; Vascular surgery; Cataract extraction w/PHACO (Left, 08/20/2018); and Eye surgery. Family: family history includes Arthritis in her mother; Cancer in her brother, brother, and mother; Diabetes in her brother, mother, and sister; Heart disease in her father and mother.  Constitutional Exam  General appearance: Well nourished, well developed, and well hydrated. In no apparent acute distress Vitals:   09/02/18 0829  BP: (!) 79/67  Pulse: 74  Temp: 97.9 F (36.6 C)  SpO2: 100%  Weight: 106 lb (48.1 kg)  Height: _0  (1.473 m)   BMI Assessment: Estimated body mass index is 22.15 kg/m as calculated from the following:   Height as of this encounter: _1  (1.473 m).   Weight as of this encounter: 106 lb (48.1 kg).  BMI interpretation table: BMI level Category Range association with higher incidence of chronic pain  <18 kg/m2 Underweight   18.5-24.9 kg/m2 Ideal body weight   25-29.9 kg/m2 Overweight Increased incidence by 20%  30-34.9 kg/m2 Obese (Class I) Increased incidence by 68%  35-39.9 kg/m2 Severe obesity (Class II) Increased incidence by 136%  >40 kg/m2 Extreme obesity (Class III) Increased incidence by 254%   Patient's current BMI Ideal Body weight  Body mass index is 22.15 kg/m. Patient must be at least 60 in tall to calculate ideal body weight   BMI Readings from Last 4 Encounters:  09/02/18 22.15 kg/m  08/20/18 22.15 kg/m  08/17/18 22.15 kg/m  08/11/18 22.15 kg/m   Wt Readings from Last 4 Encounters:  09/02/18 106 lb (48.1 kg)  08/20/18 106 lb (48.1 kg)  08/17/18 106 lb (48.1 kg)  08/11/18 106 lb (48.1 kg)  Psych/Mental status: Alert, oriented x 3 (person, place, & time)       Eyes: PERLA Respiratory: No evidence of acute respiratory distress  Cervical Spine Area Exam  Skin & Axial Inspection: No masses,  redness, edema, swelling, or associated skin lesions Alignment: Symmetrical Functional ROM: Unrestricted ROM      Stability: No instability detected Muscle Tone/Strength: Functionally intact. No obvious neuro-muscular anomalies detected. Sensory (Neurological): Unimpaired Palpation: No palpable anomalies              Upper Extremity (UE) Exam    Side: Right upper extremity  Side: Left upper extremity  Skin & Extremity Inspection: Skin color, temperature, and hair growth are WNL. No peripheral edema or cyanosis. No masses, redness, swelling, asymmetry, or associated skin lesions. No contractures.  Skin & Extremity Inspection: Skin color, temperature, and hair growth are WNL. No peripheral edema or cyanosis. No masses, redness,  swelling, asymmetry, or associated skin lesions. No contractures.  Functional ROM: Unrestricted ROM          Functional ROM: Unrestricted ROM          Muscle Tone/Strength: Functionally intact. No obvious neuro-muscular anomalies detected.  Muscle Tone/Strength: Functionally intact. No obvious neuro-muscular anomalies detected.  Sensory (Neurological): Unimpaired          Sensory (Neurological): Unimpaired          Palpation: No palpable anomalies              Palpation: No palpable anomalies              Provocative Test(s):  Phalen's test: deferred Tinel's test: deferred Apley's scratch test (touch opposite shoulder):  Action 1 (Across chest): deferred Action 2 (Overhead): deferred Action 3 (LB reach): deferred   Provocative Test(s):  Phalen's test: deferred Tinel's test: deferred Apley's scratch test (touch opposite shoulder):  Action 1 (Across chest): deferred Action 2 (Overhead): deferred Action 3 (LB reach): deferred    Thoracic Spine Area Exam  Skin & Axial Inspection: No masses, redness, or swelling Alignment: Symmetrical Functional ROM: Unrestricted ROM Stability: No instability detected Muscle Tone/Strength: Functionally intact. No obvious  neuro-muscular anomalies detected. Sensory (Neurological): Unimpaired Muscle strength & Tone: No palpable anomalies  Lumbar Spine Area Exam  Skin & Axial Inspection: No masses, redness, or swelling Alignment: Symmetrical Functional ROM: Unrestricted ROM       Stability: No instability detected Muscle Tone/Strength: Functionally intact. No obvious neuro-muscular anomalies detected. Sensory (Neurological): Unimpaired Palpation: No palpable anomalies       Provocative Tests: Hyperextension/rotation test: deferred today       Lumbar quadrant test (Kemp's test): deferred today       Lateral bending test: deferred today       Patrick's Maneuver: deferred today                   FABER test: deferred today                   S-I anterior distraction/compression test: deferred today         S-I lateral compression test: deferred today         S-I Thigh-thrust test: deferred today         S-I Gaenslen's test: deferred today          Gait & Posture Assessment  Ambulation: Unassisted Gait: Relatively normal for age and body habitus Posture: WNL   Lower Extremity Exam    Side: Right lower extremity  Side: Left lower extremity  Stability: No instability observed          Stability: No instability observed          Skin & Extremity Inspection: Skin color, temperature, and hair growth are WNL. No peripheral edema or cyanosis. No masses, redness, swelling, asymmetry, or associated skin lesions. No contractures.  Skin & Extremity Inspection: Skin color, temperature, and hair growth are WNL. No peripheral edema or cyanosis. No masses, redness, swelling, asymmetry, or associated skin lesions. No contractures.  Functional ROM: Unrestricted ROM                  Functional ROM: Unrestricted ROM                  Muscle Tone/Strength: Functionally intact. No obvious neuro-muscular anomalies detected.  Muscle Tone/Strength: Functionally intact. No obvious neuro-muscular anomalies detected.  Sensory  (Neurological): Unimpaired  Sensory (Neurological): Unimpaired  Palpation: No palpable anomalies  Palpation: No palpable anomalies   Assessment  Primary Diagnosis & Pertinent Problem List: The primary encounter diagnosis was Chronic low back pain (Location of Primary Source of Pain) (Bilateral) (L>R). 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  Status Diagnosis  Controlled Controlled Controlled 1. Chronic low back pain (Location of Primary Source of Pain) (Bilateral) (L>R)   2. Lumbar facet syndrome (Location of Primary Source of Pain) (Bilateral) (L>R)   3. Spondylosis without myelopathy or radiculopathy, lumbar region   4. Lumbar spondylosis   5. Chronic pain syndrome   6. Pharmacologic therapy   7. Long term current use of opiate analgesic   8. Opiate use (160 MME/Day)   9. Disorder of skeletal system   10. Problems influencing health status   11. DDD (degenerative disc disease), lumbar   12. Abnormal CT scan, lumbar spine (08/04/2017)   13. Levoscoliosis (Severe)   14. 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, L4-5) w/  neurogenic claudication   15. Lumbar Grade 1 Anterolisthesis of L3 over L4 and L4 over L5   16. Lumbar foraminal stenosis (Bilateral: L3-4, L4-5) (Severe Right L4-5)   17. Lumbar facet arthropathy (Bilateral)   18. Lumbar facet osteoarthritis (Bilateral)   19. Lumbar spondylosis   20. Cervical spondylosis   21. Thoracic spondylosis   22. DDD (degenerative disc disease), thoracic   23. DDD (degenerative disc disease), cervical     Problems updated and reviewed during this visit: Problem  Ddd (Degenerative Disc Disease), Lumbar  Abnormal CT scan, lumbar spine (08/04/2017)   CT LUMBAR SPINE IMPRESSION 1. No acute fracture. Severe levoscoliosis. L3-4 and L4-5 anterolisthesis on degenerative basis. 2. Moderate canal stenosis L4-5, mild at L3-4. 3. Neural foraminal narrowing L3-4 and L4-5, severe on the RIGHT at L4-5.   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   Lumbar Grade 1 Anterolisthesis of L3 over L4 and L4 over L5  Lumbar foraminal stenosis (Bilateral: L3-4, L4-5) (Severe Right L4-5)   Neural foraminal narrowing L3-4 and L4-5, severe on the RIGHT at L4-5.   Lumbar facet arthropathy (Bilateral)  Lumbar facet osteoarthritis (Bilateral)  Lumbar Spondylosis  Cervical Spondylosis  Thoracic Spondylosis  Ddd (Degenerative Disc Disease), Thoracic  Ddd (Degenerative Disc Disease), Cervical  Generalized Weakness  Rib Fracture   Displace right 9th rib fracture.    Lumbar facet syndrome (Bilateral) (L>R)  Lumbar radicular pain (Bilateral) (L>R) (L4)  Chronic low back pain (Primary Source of Pain) (Bilateral) (L>R)  Chronic Thoracic Back Pain  Migraine  Disorder of Skeletal System  Problems Influencing Health Status  Goals of Care, Counseling/Discussion  Palliative Care By Specialist  Hypoxia  Marijuana Use  Metabolic Encephalopathy  Dementia (HCC)  Insomnia  Compulsive Tobacco  User Syndrome  Urinary Incontinence  Gerd (Gastroesophageal Reflux Disease)  Anorexia  Depression Due to Physical Illness  Acute Encephalopathy  Iron Deficiency Anemia  Acute On Chronic Respiratory Failure (Hcc)  Gastritis Without Bleeding  Pneumonia  Hypokalemia  Protein-calorie malnutrition, severe  Nausea With Vomiting  Acute On Chronic Respiratory Failure With Hypoxia (Hcc)  Dysphagia  Acute Diastolic Chf (Congestive Heart Failure) (Hcc)  Leukocytosis  Abnormal Mammogram of Right Breast  Dilated Intrahepatic Bile Duct  Copd With Acute Exacerbation (Hcc)  Airway Hyperreactivity  Excessive Falling  Alteration in Bowel Elimination: Incontinence  Decreased Testosterone Level   Undetectable level on labs done by Dr. Consuela Mimes   Menopausal Symptom  Fecal Occult Blood Test Positive  Panic Disorder  Weight Loss  Essential Hypertension  Hyperlipemia  Anxiety  Coronary Artery Disease   Occluded posterior descending coronary arteries. Minimal plaque (<25% stenosis) of left main, proximal-mid-distal LAD,  proximal-distal circumflex, OM1, Proximal-mid-distal RCA per CTA by Dr. Humphrey Rolls 06/04/13   Peripheral Vascular Disease (Hcc)   S/p distal abdominal aorta stenting 2009   Acute Postoperative Pain (Resolved)  Inflammation of sacroiliac joint (HCC) (Resolved)  Pneumonia (Resolved)  Community Acquired Pneumonia (Resolved)  Pneumonia (Resolved)  Hypotension (Resolved)  Lactic Acidosis (Resolved)  Aspiration Pneumonitis (Hcc) (Resolved)  Anemia of Chronic Disease (Resolved)  Pneumonia (Resolved)  Malnutrition of moderate degree (Resolved)  Sepsis (Hcc) (Resolved)  Acute Bronchitis (Resolved)  Abdominal Pain (Resolved)  Acute Delirium (Resolved)  Altered Mental Status (Resolved)  Acute Encephalopathy (Resolved)  Gastrointestinal Complaints, Nonspecific (Resolved)  Cellulitis of Leg (Resolved)  Diaphoresis (Resolved)  Bleeding From The Nose (Resolved)  Hematoma (Resolved)  H/O  Gastrointestinal Disease (Resolved)  Breathlessness On Exertion (Resolved)  Accumulation of Fluid in Tissues (Resolved)  Episode of Syncope (Resolved)  Copd Exacerbation (Hcc) (Resolved)   Time Note: Greater than 50% of the 40 minute(s) of face-to-face time spent with Ms. Lowdermilk, was spent in counseling/coordination of care regarding: the appropriate use of the pain scale, opioid tolerance, "Drug Holidays", Ms. Graser's primary cause of pain, the results of her recent test(s), the significance of each one oth the test(s) anomalies and it's corresponding characteristic pain pattern(s), the treatment plan, treatment alternatives, the risks and possible complications of proposed treatment, medication side effects, the opioid analgesic risks and possible complications, realistic expectations, the goals of pain management (increased in functionality), the patient's responsibilities when it comes to controlled substances and the need to collect and read the AVS material.  Plan of Care  Pharmacotherapy (Medications Ordered): No orders of the defined types were placed in this encounter.  Medications administered today: Lesle Reek had no medications administered during this visit.   Procedure Orders     Lumbar Epidural Injection  Lab Orders     ToxASSURE Select 13 (MW), Urine Imaging Orders  No imaging studies ordered today   Referral Orders  No referral(s) requested today   Interventional management options: Planned, scheduled, and/or pending:   Palliative left L4-5 LESI under fluoroscopic guidance and IV sedation.   Considering:   Bilateral lumbar facet radiofrequency ablation (Last done: R: 08/12/17; L: 04/14/17)   Palliative PRN treatment(s):   Palliative bilateral lumbar facet block  Bilateral lumbar facet radiofrequency ablation    Provider-requested follow-up: Return for (Blood-thinner Protocol), Procedure (w/ sedation): (L) L4-5 LESI.  Future Appointments  Date Time Provider  [place]  09/04/2018 12:45 PM [facility] None  09/04/2018  1:15 PM Sindy Guadeloupe, MD [facility] None  09/11/2018 10:30 AM Alfonzo Feller, RN [facility] None  10/28/2018  9:00 AM Vevelyn Francois, NP [facility] None  11/10/2018  8:00 AM Caryn Section, Kirstie Peri, MD [facility] None   Primary Care Physician: Birdie Sons, MD Location: [facility] Note by: Gaspar Cola, MD Date: 09/02/2018; Time: 10:08 AM

## 2018-09-02 ENCOUNTER — Other Ambulatory Visit: Payer: Self-pay

## 2018-09-02 ENCOUNTER — Encounter: Payer: Self-pay | Admitting: Pain Medicine

## 2018-09-02 ENCOUNTER — Ambulatory Visit: Payer: Medicare HMO | Attending: Pain Medicine | Admitting: Pain Medicine

## 2018-09-02 ENCOUNTER — Ambulatory Visit: Payer: Self-pay | Admitting: *Deleted

## 2018-09-02 VITALS — BP 79/67 | HR 74 | Temp 97.9°F | Ht <= 58 in | Wt 106.0 lb

## 2018-09-02 DIAGNOSIS — Z789 Other specified health status: Secondary | ICD-10-CM

## 2018-09-02 DIAGNOSIS — M4310 Spondylolisthesis, site unspecified: Secondary | ICD-10-CM

## 2018-09-02 DIAGNOSIS — I110 Hypertensive heart disease with heart failure: Secondary | ICD-10-CM | POA: Diagnosis not present

## 2018-09-02 DIAGNOSIS — M5134 Other intervertebral disc degeneration, thoracic region: Secondary | ICD-10-CM

## 2018-09-02 DIAGNOSIS — E559 Vitamin D deficiency, unspecified: Secondary | ICD-10-CM | POA: Diagnosis not present

## 2018-09-02 DIAGNOSIS — M5441 Lumbago with sciatica, right side: Secondary | ICD-10-CM

## 2018-09-02 DIAGNOSIS — F419 Anxiety disorder, unspecified: Secondary | ICD-10-CM | POA: Insufficient documentation

## 2018-09-02 DIAGNOSIS — J9621 Acute and chronic respiratory failure with hypoxia: Secondary | ICD-10-CM | POA: Insufficient documentation

## 2018-09-02 DIAGNOSIS — Z5181 Encounter for therapeutic drug level monitoring: Secondary | ICD-10-CM | POA: Diagnosis not present

## 2018-09-02 DIAGNOSIS — K219 Gastro-esophageal reflux disease without esophagitis: Secondary | ICD-10-CM | POA: Diagnosis not present

## 2018-09-02 DIAGNOSIS — F1290 Cannabis use, unspecified, uncomplicated: Secondary | ICD-10-CM | POA: Insufficient documentation

## 2018-09-02 DIAGNOSIS — M47816 Spondylosis without myelopathy or radiculopathy, lumbar region: Secondary | ICD-10-CM

## 2018-09-02 DIAGNOSIS — Z79891 Long term (current) use of opiate analgesic: Secondary | ICD-10-CM | POA: Diagnosis not present

## 2018-09-02 DIAGNOSIS — F17210 Nicotine dependence, cigarettes, uncomplicated: Secondary | ICD-10-CM | POA: Insufficient documentation

## 2018-09-02 DIAGNOSIS — M797 Fibromyalgia: Secondary | ICD-10-CM | POA: Diagnosis not present

## 2018-09-02 DIAGNOSIS — F1190 Opioid use, unspecified, uncomplicated: Secondary | ICD-10-CM

## 2018-09-02 DIAGNOSIS — M50323 Other cervical disc degeneration at C6-C7 level: Secondary | ICD-10-CM | POA: Insufficient documentation

## 2018-09-02 DIAGNOSIS — Z885 Allergy status to narcotic agent status: Secondary | ICD-10-CM | POA: Insufficient documentation

## 2018-09-02 DIAGNOSIS — M51369 Other intervertebral disc degeneration, lumbar region without mention of lumbar back pain or lower extremity pain: Secondary | ICD-10-CM

## 2018-09-02 DIAGNOSIS — Z8261 Family history of arthritis: Secondary | ICD-10-CM | POA: Insufficient documentation

## 2018-09-02 DIAGNOSIS — M810 Age-related osteoporosis without current pathological fracture: Secondary | ICD-10-CM | POA: Diagnosis not present

## 2018-09-02 DIAGNOSIS — M50322 Other cervical disc degeneration at C5-C6 level: Secondary | ICD-10-CM | POA: Insufficient documentation

## 2018-09-02 DIAGNOSIS — M5030 Other cervical disc degeneration, unspecified cervical region: Secondary | ICD-10-CM

## 2018-09-02 DIAGNOSIS — G894 Chronic pain syndrome: Secondary | ICD-10-CM | POA: Diagnosis not present

## 2018-09-02 DIAGNOSIS — J45909 Unspecified asthma, uncomplicated: Secondary | ICD-10-CM | POA: Diagnosis not present

## 2018-09-02 DIAGNOSIS — M47814 Spondylosis without myelopathy or radiculopathy, thoracic region: Secondary | ICD-10-CM

## 2018-09-02 DIAGNOSIS — G8929 Other chronic pain: Secondary | ICD-10-CM

## 2018-09-02 DIAGNOSIS — Z9049 Acquired absence of other specified parts of digestive tract: Secondary | ICD-10-CM | POA: Insufficient documentation

## 2018-09-02 DIAGNOSIS — F410 Panic disorder [episodic paroxysmal anxiety] without agoraphobia: Secondary | ICD-10-CM | POA: Diagnosis not present

## 2018-09-02 DIAGNOSIS — J441 Chronic obstructive pulmonary disease with (acute) exacerbation: Secondary | ICD-10-CM | POA: Diagnosis not present

## 2018-09-02 DIAGNOSIS — M48061 Spinal stenosis, lumbar region without neurogenic claudication: Secondary | ICD-10-CM

## 2018-09-02 DIAGNOSIS — I509 Heart failure, unspecified: Secondary | ICD-10-CM | POA: Insufficient documentation

## 2018-09-02 DIAGNOSIS — Z8249 Family history of ischemic heart disease and other diseases of the circulatory system: Secondary | ICD-10-CM | POA: Insufficient documentation

## 2018-09-02 DIAGNOSIS — E785 Hyperlipidemia, unspecified: Secondary | ICD-10-CM | POA: Diagnosis not present

## 2018-09-02 DIAGNOSIS — Z780 Asymptomatic menopausal state: Secondary | ICD-10-CM | POA: Insufficient documentation

## 2018-09-02 DIAGNOSIS — Z833 Family history of diabetes mellitus: Secondary | ICD-10-CM | POA: Insufficient documentation

## 2018-09-02 DIAGNOSIS — R937 Abnormal findings on diagnostic imaging of other parts of musculoskeletal system: Secondary | ICD-10-CM | POA: Insufficient documentation

## 2018-09-02 DIAGNOSIS — M899 Disorder of bone, unspecified: Secondary | ICD-10-CM | POA: Diagnosis not present

## 2018-09-02 DIAGNOSIS — Z7902 Long term (current) use of antithrombotics/antiplatelets: Secondary | ICD-10-CM | POA: Insufficient documentation

## 2018-09-02 DIAGNOSIS — M47812 Spondylosis without myelopathy or radiculopathy, cervical region: Secondary | ICD-10-CM | POA: Diagnosis not present

## 2018-09-02 DIAGNOSIS — M5442 Lumbago with sciatica, left side: Secondary | ICD-10-CM

## 2018-09-02 DIAGNOSIS — M4180 Other forms of scoliosis, site unspecified: Secondary | ICD-10-CM

## 2018-09-02 DIAGNOSIS — M48062 Spinal stenosis, lumbar region with neurogenic claudication: Secondary | ICD-10-CM | POA: Insufficient documentation

## 2018-09-02 DIAGNOSIS — Z9889 Other specified postprocedural states: Secondary | ICD-10-CM | POA: Insufficient documentation

## 2018-09-02 DIAGNOSIS — D509 Iron deficiency anemia, unspecified: Secondary | ICD-10-CM | POA: Insufficient documentation

## 2018-09-02 DIAGNOSIS — Z886 Allergy status to analgesic agent status: Secondary | ICD-10-CM | POA: Insufficient documentation

## 2018-09-02 DIAGNOSIS — M5136 Other intervertebral disc degeneration, lumbar region: Secondary | ICD-10-CM | POA: Diagnosis not present

## 2018-09-02 DIAGNOSIS — Z882 Allergy status to sulfonamides status: Secondary | ICD-10-CM | POA: Insufficient documentation

## 2018-09-02 DIAGNOSIS — Z79899 Other long term (current) drug therapy: Secondary | ICD-10-CM

## 2018-09-02 DIAGNOSIS — Z888 Allergy status to other drugs, medicaments and biological substances status: Secondary | ICD-10-CM | POA: Insufficient documentation

## 2018-09-02 NOTE — Progress Notes (Signed)
Nursing Pain Medication Assessment:  Safety precautions to be maintained throughout the outpatient stay will include: orient to surroundings, keep bed in low position, maintain call bell within reach at all times, provide assistance with transfer out of bed and ambulation.  Medication Inspection Compliance: Pill count conducted under aseptic conditions, in front of the patient. Neither the pills nor the bottle was removed from the patient's sight at any time. Once count was completed pills were immediately returned to the patient in their original bottle.  Medication: Fentanyl patch Pill/Patch Count: 10 of 10 pills remain Pill/Patch Appearance: Markings consistent with prescribed medication Bottle Appearance: Standard pharmacy container. Clearly labeled. Filled Date: 10 /08 / 2018 Last Medication intake:  Burgess Estelle

## 2018-09-03 ENCOUNTER — Encounter: Payer: Self-pay | Admitting: Pain Medicine

## 2018-09-03 DIAGNOSIS — M47817 Spondylosis without myelopathy or radiculopathy, lumbosacral region: Secondary | ICD-10-CM | POA: Insufficient documentation

## 2018-09-03 DIAGNOSIS — L03116 Cellulitis of left lower limb: Secondary | ICD-10-CM | POA: Diagnosis not present

## 2018-09-03 DIAGNOSIS — M25552 Pain in left hip: Secondary | ICD-10-CM

## 2018-09-03 DIAGNOSIS — G8929 Other chronic pain: Secondary | ICD-10-CM | POA: Insufficient documentation

## 2018-09-03 DIAGNOSIS — M4180 Other forms of scoliosis, site unspecified: Secondary | ICD-10-CM | POA: Diagnosis not present

## 2018-09-03 DIAGNOSIS — D509 Iron deficiency anemia, unspecified: Secondary | ICD-10-CM | POA: Diagnosis not present

## 2018-09-03 DIAGNOSIS — M79605 Pain in left leg: Secondary | ICD-10-CM

## 2018-09-03 DIAGNOSIS — M5388 Other specified dorsopathies, sacral and sacrococcygeal region: Secondary | ICD-10-CM | POA: Insufficient documentation

## 2018-09-03 DIAGNOSIS — G609 Hereditary and idiopathic neuropathy, unspecified: Secondary | ICD-10-CM | POA: Diagnosis not present

## 2018-09-03 DIAGNOSIS — I2510 Atherosclerotic heart disease of native coronary artery without angina pectoris: Secondary | ICD-10-CM | POA: Diagnosis not present

## 2018-09-03 DIAGNOSIS — M533 Sacrococcygeal disorders, not elsewhere classified: Secondary | ICD-10-CM | POA: Insufficient documentation

## 2018-09-03 DIAGNOSIS — I739 Peripheral vascular disease, unspecified: Secondary | ICD-10-CM | POA: Diagnosis not present

## 2018-09-03 DIAGNOSIS — J441 Chronic obstructive pulmonary disease with (acute) exacerbation: Secondary | ICD-10-CM | POA: Diagnosis not present

## 2018-09-03 DIAGNOSIS — M160 Bilateral primary osteoarthritis of hip: Secondary | ICD-10-CM | POA: Insufficient documentation

## 2018-09-03 DIAGNOSIS — M79604 Pain in right leg: Secondary | ICD-10-CM

## 2018-09-03 DIAGNOSIS — I10 Essential (primary) hypertension: Secondary | ICD-10-CM | POA: Diagnosis not present

## 2018-09-03 DIAGNOSIS — M25551 Pain in right hip: Secondary | ICD-10-CM

## 2018-09-03 DIAGNOSIS — L03115 Cellulitis of right lower limb: Secondary | ICD-10-CM | POA: Diagnosis not present

## 2018-09-04 ENCOUNTER — Inpatient Hospital Stay: Payer: Medicare HMO | Attending: Oncology | Admitting: Oncology

## 2018-09-04 ENCOUNTER — Other Ambulatory Visit: Payer: Self-pay | Admitting: *Deleted

## 2018-09-04 ENCOUNTER — Encounter: Payer: Self-pay | Admitting: Oncology

## 2018-09-04 ENCOUNTER — Other Ambulatory Visit: Payer: Self-pay

## 2018-09-04 ENCOUNTER — Inpatient Hospital Stay: Payer: Medicare HMO

## 2018-09-04 VITALS — BP 138/65 | HR 85 | Temp 98.0°F | Resp 12 | Ht <= 58 in | Wt 106.0 lb

## 2018-09-04 DIAGNOSIS — E785 Hyperlipidemia, unspecified: Secondary | ICD-10-CM | POA: Diagnosis not present

## 2018-09-04 DIAGNOSIS — M2578 Osteophyte, vertebrae: Secondary | ICD-10-CM | POA: Insufficient documentation

## 2018-09-04 DIAGNOSIS — Z86718 Personal history of other venous thrombosis and embolism: Secondary | ICD-10-CM | POA: Diagnosis not present

## 2018-09-04 DIAGNOSIS — I2510 Atherosclerotic heart disease of native coronary artery without angina pectoris: Secondary | ICD-10-CM | POA: Diagnosis not present

## 2018-09-04 DIAGNOSIS — J449 Chronic obstructive pulmonary disease, unspecified: Secondary | ICD-10-CM | POA: Insufficient documentation

## 2018-09-04 DIAGNOSIS — M50321 Other cervical disc degeneration at C4-C5 level: Secondary | ICD-10-CM | POA: Diagnosis not present

## 2018-09-04 DIAGNOSIS — K219 Gastro-esophageal reflux disease without esophagitis: Secondary | ICD-10-CM | POA: Diagnosis not present

## 2018-09-04 DIAGNOSIS — D509 Iron deficiency anemia, unspecified: Secondary | ICD-10-CM | POA: Insufficient documentation

## 2018-09-04 DIAGNOSIS — I509 Heart failure, unspecified: Secondary | ICD-10-CM | POA: Insufficient documentation

## 2018-09-04 DIAGNOSIS — I739 Peripheral vascular disease, unspecified: Secondary | ICD-10-CM | POA: Insufficient documentation

## 2018-09-04 DIAGNOSIS — Z79899 Other long term (current) drug therapy: Secondary | ICD-10-CM | POA: Diagnosis not present

## 2018-09-04 DIAGNOSIS — G609 Hereditary and idiopathic neuropathy, unspecified: Secondary | ICD-10-CM | POA: Diagnosis not present

## 2018-09-04 DIAGNOSIS — J441 Chronic obstructive pulmonary disease with (acute) exacerbation: Secondary | ICD-10-CM | POA: Diagnosis not present

## 2018-09-04 DIAGNOSIS — L03116 Cellulitis of left lower limb: Secondary | ICD-10-CM | POA: Diagnosis not present

## 2018-09-04 DIAGNOSIS — G629 Polyneuropathy, unspecified: Secondary | ICD-10-CM | POA: Diagnosis not present

## 2018-09-04 DIAGNOSIS — E041 Nontoxic single thyroid nodule: Secondary | ICD-10-CM | POA: Insufficient documentation

## 2018-09-04 DIAGNOSIS — E559 Vitamin D deficiency, unspecified: Secondary | ICD-10-CM

## 2018-09-04 DIAGNOSIS — F419 Anxiety disorder, unspecified: Secondary | ICD-10-CM | POA: Diagnosis not present

## 2018-09-04 DIAGNOSIS — I10 Essential (primary) hypertension: Secondary | ICD-10-CM | POA: Insufficient documentation

## 2018-09-04 DIAGNOSIS — F17210 Nicotine dependence, cigarettes, uncomplicated: Secondary | ICD-10-CM | POA: Insufficient documentation

## 2018-09-04 DIAGNOSIS — I110 Hypertensive heart disease with heart failure: Secondary | ICD-10-CM | POA: Diagnosis not present

## 2018-09-04 DIAGNOSIS — D638 Anemia in other chronic diseases classified elsewhere: Secondary | ICD-10-CM | POA: Diagnosis not present

## 2018-09-04 DIAGNOSIS — L03115 Cellulitis of right lower limb: Secondary | ICD-10-CM | POA: Diagnosis not present

## 2018-09-04 DIAGNOSIS — M4180 Other forms of scoliosis, site unspecified: Secondary | ICD-10-CM | POA: Diagnosis not present

## 2018-09-04 LAB — CBC
HEMATOCRIT: 34.7 % — AB (ref 36.0–46.0)
Hemoglobin: 10.6 g/dL — ABNORMAL LOW (ref 12.0–15.0)
MCH: 29 pg (ref 26.0–34.0)
MCHC: 30.5 g/dL (ref 30.0–36.0)
MCV: 95.1 fL (ref 80.0–100.0)
NRBC: 0 % (ref 0.0–0.2)
PLATELETS: 317 10*3/uL (ref 150–400)
RBC: 3.65 MIL/uL — ABNORMAL LOW (ref 3.87–5.11)
RDW: 14.3 % (ref 11.5–15.5)
WBC: 4.8 10*3/uL (ref 4.0–10.5)

## 2018-09-04 LAB — FERRITIN: Ferritin: 55 ng/mL (ref 11–307)

## 2018-09-04 LAB — IRON AND TIBC
Iron: 71 ug/dL (ref 28–170)
SATURATION RATIOS: 44 % — AB (ref 10.4–31.8)
TIBC: 163 ug/dL — AB (ref 250–450)
UIBC: 92 ug/dL

## 2018-09-04 NOTE — Progress Notes (Signed)
Patient reports no unusual symptoms except having chills occasionally.

## 2018-09-07 DIAGNOSIS — G609 Hereditary and idiopathic neuropathy, unspecified: Secondary | ICD-10-CM | POA: Diagnosis not present

## 2018-09-07 DIAGNOSIS — I739 Peripheral vascular disease, unspecified: Secondary | ICD-10-CM | POA: Diagnosis not present

## 2018-09-07 DIAGNOSIS — I10 Essential (primary) hypertension: Secondary | ICD-10-CM | POA: Diagnosis not present

## 2018-09-07 DIAGNOSIS — L03116 Cellulitis of left lower limb: Secondary | ICD-10-CM | POA: Diagnosis not present

## 2018-09-07 DIAGNOSIS — I2510 Atherosclerotic heart disease of native coronary artery without angina pectoris: Secondary | ICD-10-CM | POA: Diagnosis not present

## 2018-09-07 DIAGNOSIS — J441 Chronic obstructive pulmonary disease with (acute) exacerbation: Secondary | ICD-10-CM | POA: Diagnosis not present

## 2018-09-07 DIAGNOSIS — L03115 Cellulitis of right lower limb: Secondary | ICD-10-CM | POA: Diagnosis not present

## 2018-09-07 DIAGNOSIS — D509 Iron deficiency anemia, unspecified: Secondary | ICD-10-CM | POA: Diagnosis not present

## 2018-09-07 DIAGNOSIS — M4180 Other forms of scoliosis, site unspecified: Secondary | ICD-10-CM | POA: Diagnosis not present

## 2018-09-07 LAB — TOXASSURE SELECT 13 (MW), URINE

## 2018-09-07 NOTE — Progress Notes (Signed)
Hematology/Oncology Consult note [facility]  [phone] [phone]  Patient Care Team: Birdie Sons, MD as PCP - General (Family Medicine) Milinda Pointer, MD (Pain Medicine) Dionisio David, MD as Consulting Physician (Cardiology) Sherrlyn Hock, MD as Referring Physician (Gastroenterology) Vladimir Crofts, MD as Consulting Physician (Neurology) Alfonzo Feller, RN as [facility]   Name of the patient: Cheryl [name]  [id number]  1951-10-13   Date of visit: 09/07/18  Diagnosis- anemia of chronic disease with some component of iron deficiency   Chief complaint/ Reason for visit-routine follow-up of anemia  Heme/Onc history: patient is a 67 year old female with a past medical history that is significant for multiple comorbidities including coronary artery disease hypertension hyperlipidemia COPD peripheral vascular disease among other medical problems. she was treated for pneumonia twice in the hospital in July 2019  Looking back at her CBC patient has had chronic anemia with a hemoglobin around 10 since 2017. It then gradually drifted down to the low nines in 2018. Since June 2019 her hemoglobin has been between 7-8. She was found to have a hemoglobin of 6.3/19.3 when she was admitted on 06/02/2018. BMP was within normal limits and there was no evidence of chronic kidney disease. Iron studies were indicative of chronic disease. B12 and folate were normal. Haptoglobin was elevated and LDH was normal reticulocyte count was normal not suggestive of hemolysis. She also recently had an upper endoscopy on 05/18/2018 for symptoms of difficulty swallowing and was found to have mild gastritis but a normal esophagus and duodenum.   Interval history-she is taking oral iron once a day and reports no significant side effects.  Denies any abdominal pain or constipation from iron.  Denies any blood in  her stool or urine.  ECOG PS- 2 Pain scale- 0 Opioid associated constipation- no  Review of systems- Review of Systems  Constitutional: Positive for malaise/fatigue. Negative for chills, fever and weight loss.  HENT: Negative for congestion, ear discharge and nosebleeds.   Eyes: Negative for blurred vision.  Respiratory: Negative for cough, hemoptysis, sputum production, shortness of breath and wheezing.   Cardiovascular: Negative for chest pain, palpitations, orthopnea and claudication.  Gastrointestinal: Negative for abdominal pain, blood in stool, constipation, diarrhea, heartburn, melena, nausea and vomiting.  Genitourinary: Negative for dysuria, flank pain, frequency, hematuria and urgency.  Musculoskeletal: Negative for back pain, joint pain and myalgias.  Skin: Negative for rash.  Neurological: Negative for dizziness, tingling, focal weakness, seizures, weakness and headaches.  Endo/Heme/Allergies: Does not bruise/bleed easily.  Psychiatric/Behavioral: Negative for depression and suicidal ideas. The patient does not have insomnia.       Allergies  Allergen Reactions  . Percocet [Oxycodone-Acetaminophen] Hives and Rash  . Aspirin Nausea And Vomiting and Other (See Comments)  . Codeine Nausea And Vomiting, Nausea Only and Other (See Comments)  . Propoxyphene Nausea Only and Other (See Comments)  . Sulfa Antibiotics Rash and Other (See Comments)     Past Medical History:  Diagnosis Date  . Acute postoperative pain 04/14/2017  . Allergy   . Anemia   . Anxiety   . Arthritis    fingers  . Aspiration pneumonitis ([place]) 11/24/2015  . Asthma   . CHF (congestive heart failure) ([place])   . Chronic pain   . COPD (chronic obstructive pulmonary disease) ([place])   . Coronary artery disease   . DVT (deep venous thrombosis) ([place])   . Dyspnea   . Dysrhythmia   .  Encephalopathy    MULTIPLE TIMES  . GERD (gastroesophageal reflux disease)   . Headache   . Hyperlipidemia   .  Hypertension   . Kyphoscoliosis deformity of spine   . Migraines   . Neuropathy 2010  . Osteoporosis   . Oxygen deficiency   . Peripheral vascular disease ([place])   . Pneumonia   . Pneumonia 11/19/2015  . Pneumonia 10/2016  . Pneumonia    aspiration 2018- 4 times in last year  . Vitamin D deficiency   . Wears dentures    full upper and lower     Past Surgical History:  Procedure Laterality Date  . ABDOMINAL HYSTERECTOMY  1975   Bilaterl Oophorectomy; [place] to IUD infection  . abdomnal aortic stent  05/30/2008   Dr. Quay Burow  . APPENDECTOMY    . APPENDECTOMY    . BACK SURGERY    . cardiac catherization  10/31/2009  . CARDIAC CATHETERIZATION    . CATARACT EXTRACTION W/PHACO Left 08/20/2018   Procedure: CATARACT EXTRACTION PHACO AND INTRAOCULAR LENS PLACEMENT (IOC);  Surgeon: Eulogio Bear, MD;  Location: [facility] ORS;  Service: Ophthalmology;  Laterality: Left;  [place] 00:45.1 AP% 12.3 CDE 5.54 FLUID PACK LOT # [id number]  . CERVICAL FUSION  C5 - 6/C6-7  . CHOLECYSTECTOMY  1972  . COLONOSCOPY WITH PROPOFOL N/A 07/27/2015   Procedure: COLONOSCOPY WITH PROPOFOL;  Surgeon: Hulen Luster, MD;  [facility] END[facility]COPY;  Service: Gastroenterology;  Laterality: N/A;  . ESOPHAGOGASTRODUODENOSCOPY (EGD) WITH PROPOFOL N/A 07/27/2015   Procedure: ESOPHAGOGASTRODUODENOSCOPY (EGD) WITH PROPOFOL;  Surgeon: Hulen Luster, MD;  Location: [facility] ENDOSCOPY;  Service: Gastroenterology;  Laterality: N/A;  . ESOPHAGOGASTRODUODENOSCOPY (EGD) WITH PROPOFOL N/A 05/19/2018   Procedure: ESOPHAGOGASTRODUODENOSCOPY (EGD) WITH PROPOFOL;  Surgeon: Lucilla Lame, MD;  Location: [facility] ENDOSCOPY;  Service: Endoscopy;  Laterality: N/A;  . EYE SURGERY    . FOOT SURGERY Bilateral    5-6 years per patient  . SPINE SURGERY    . TONSILLECTOMY    . VASCULAR SURGERY     LEG STENTS    Social History   Socioeconomic History  . Marital status: Married    Spouse name: Not on file  . Number of children: 6  . Years of  education: Not on file  . Highest education level: Not on file  Occupational History  . Occupation: Disabled  Social Needs  . Financial resource strain: Not on file  . Food insecurity:    Worry: Not on file    Inability: Not on file  . Transportation needs:    Medical: Not on file    Non-medical: Not on file  Tobacco Use  . Smoking status: Current Every Day Smoker    Packs/day: 1.00    Years: 50.00    Pack years: 50.00    Types: Cigarettes  . Smokeless tobacco: Never Used  . Tobacco comment: Previously smoked 2 ppd  Substance and Sexual Activity  . Alcohol use: No    Alcohol/week: 0.0 standard drinks  . Drug use: No  . Sexual activity: Never  Lifestyle  . Physical activity:    Days per week: Not on file    Minutes per session: Not on file  . Stress: Not on file  Relationships  . Social connections:    Talks on phone: Not on file    Gets together: Not on file    Attends religious service: Not on file    Active member of club or organization: Not on file  Attends meetings of clubs or organizations: Not on file    Relationship status: Not on file  . Intimate partner violence:    Fear of current or ex partner: Not on file    Emotionally abused: Not on file    Physically abused: Not on file    Forced sexual activity: Not on file  Other Topics Concern  . Not on file  Social History Narrative  . Not on file    Family History  Problem Relation Age of Onset  . Cancer Mother   . Arthritis Mother   . Heart disease Mother   . Diabetes Mother        mellitus, type 2  . Heart disease Father   . Diabetes Sister   . Cancer Brother   . Cancer Brother        lung  . Diabetes Brother      Current Outpatient Medications:  .  acetaminophen (TYLENOL) 325 MG tablet, Take 2 tablets (650 mg total) by mouth every 6 (six) hours as needed for mild pain (or Fever >/= 101)., Disp: , Rfl:  .  albuterol (PROVENTIL) (2.5 MG/3ML) 0.083% nebulizer solution, Take 3 mLs (2.5 mg total)  by nebulization every 4 (four) hours as needed for wheezing., Disp: 50 vial, Rfl: 12 .  alendronate (FOSAMAX) 70 MG tablet, Take 1 tablet (70 mg total) by mouth every 7 (seven) days. Take with a full glass of water on an empty stomach. (Patient taking differently: Take 70 mg by mouth every Monday. Take with a full glass of water on an empty stomach.), Disp: 4 tablet, Rfl: 11 .  ALPRAZolam (XANAX) 0.5 MG tablet, Take 0.5-1 tablets (0.25-0.5 mg total) by mouth at bedtime as needed for anxiety., Disp: 30 tablet, Rfl: 5 .  baclofen (LIORESAL) 10 MG tablet, Take 1 tablet (10 mg total) by mouth 3 (three) times daily as needed for muscle spasms., Disp: 30 tablet, Rfl: 0 .  celecoxib (CELEBREX) 100 MG capsule, Take 1 capsule (100 mg total) by mouth 2 (two) times daily as needed. (Patient taking differently: Take 100 mg by mouth 2 (two) times daily as needed for moderate pain. ), Disp: 60 capsule, Rfl: 2 .  clopidogrel (PLAVIX) 75 MG tablet, Take 75 mg by mouth daily., Disp: , Rfl:  .  diphenoxylate-atropine (LOMOTIL) 2.5-0.025 MG tablet, Take 1 tablet by mouth as needed for diarrhea or loose stools., Disp: , Rfl:  .  donepezil (ARICEPT) 10 MG tablet, Take 10 mg by mouth at bedtime., Disp: , Rfl:  .  estradiol (ESTRACE) 0.5 MG tablet, Take 1 tablet (0.5 mg total) by mouth every other day. (Take in place of estropipate), Disp: 30 tablet, Rfl: 3 .  [START ON 10/19/2018] fentaNYL (DURAGESIC - DOSED MCG/HR) 50 MCG/HR, Place 1 patch (50 mcg total) onto the skin every 3 (three) days., Disp: 10 patch, Rfl: 0 .  ferrous sulfate 325 (65 FE) MG tablet, Take 1 tablet (325 mg total) by mouth 2 (two) times daily with a meal., Disp: 60 tablet, Rfl: 3 .  Fluticasone-Salmeterol (ADVAIR DISKUS) 250-50 MCG/DOSE AEPB, inhale 1 dose by mouth twice a day (Patient taking differently: Inhale 1 puff into the lungs 2 (two) times daily. inhale 1 dose by mouth twice a day), Disp: 60 each, Rfl: 2 .  NARCAN 4 MG/0.1ML LIQD nasal spray kit,  Place 0.4 mg into the nose once. , Disp: , Rfl:  .  pantoprazole (PROTONIX) 40 MG tablet, Take 40 mg by mouth 2 (two)  times daily. , Disp: , Rfl:  .  pregabalin (LYRICA) 150 MG capsule, Take 1 capsule (150 mg total) by mouth 3 (three) times daily. (Patient taking differently: Take 300 mg by mouth 2 (two) times daily. ), Disp: 90 capsule, Rfl: 2 .  silver sulfADIAZINE (SILVADENE) 1 % cream, Apply 1 application topically daily as needed (leg infection). , Disp: , Rfl:  .  simvastatin (ZOCOR) 10 MG tablet, Take 10 mg by mouth at bedtime. , Disp: , Rfl:  .  Suvorexant (BELSOMRA) 5 MG TABS, Take 1 tablet by mouth at bedtime as needed (for insomnia). (Patient taking differently: Take 1 tablet by mouth at bedtime. ), Disp: 30 tablet, Rfl: 3 .  tiotropium (SPIRIVA HANDIHALER) 18 MCG inhalation capsule, inhale the contents of one capsule in the handihaler once daily (Patient taking differently: Place 18 mcg into inhaler and inhale daily. inhale the contents of one capsule in the handihaler once daily), Disp: 30 capsule, Rfl: 12 .  triamcinolone (KENALOG) 0.025 % ointment, Apply 1 application topically 2 (two) times daily. (Patient taking differently: Apply 1 application topically 2 (two) times daily as needed (cracking skin). ), Disp: 15 g, Rfl: 0 .  VENTOLIN HFA 108 (90 Base) MCG/ACT inhaler, Inhale 2 puffs into the lungs every 4 (four) hours as needed for wheezing or shortness of breath., Disp: 18 g, Rfl: 5 .  vitamin B-12 (CYANOCOBALAMIN) 1000 MCG tablet, Take 1 tablet (1,000 mcg total) by mouth daily., Disp: 30 tablet, Rfl: 0  Physical exam:  Vitals:   09/04/18 1325 09/04/18 1332  BP:  138/65  Pulse:  85  Resp: 12   Temp:  98 F (36.7 C)  Weight: 106 lb (48.1 kg)   Height: _0  (1.473 m)    Physical Exam  Constitutional: She is oriented to person, place, and time.  Thin frail  female in no acute distress  HENT:  Head: Normocephalic and atraumatic.  Eyes: Pupils are equal, round, and reactive  to light. EOM are normal.  Neck: Normal range of motion.  Cardiovascular: Normal rate, regular rhythm and normal heart sounds.  Pulmonary/Chest: Effort normal and breath sounds normal.  Abdominal: Soft. Bowel sounds are normal.  Neurological: She is alert and oriented to person, place, and time.  Skin: Skin is warm and dry.     CMP Latest Ref Rng & Units 06/22/2018  Glucose 70 - 99 mg/dL -  BUN 8 - 23 mg/dL -  Creatinine 0.44 - 1.00 mg/dL 0.65  Sodium 135 - 145 mmol/L -  Potassium 3.5 - 5.1 mmol/L -  Chloride 98 - 111 mmol/L -  CO2 22 - 32 mmol/L -  Calcium 8.9 - 10.3 mg/dL -  Total Protein 6.5 - 8.1 g/dL -  Total Bilirubin 0.3 - 1.2 mg/dL -  Alkaline Phos 38 - 126 U/L -  AST 15 - 41 U/L -  ALT 0 - 44 U/L -   CBC Latest Ref Rng & Units 09/04/2018  WBC 4.0 - 10.5 K/uL 4.8  Hemoglobin 12.0 - 15.0 g/dL 10.6(L)  Hematocrit 36.0 - 46.0 % 34.7(L)  Platelets 150 - 400 K/uL 317    No images are attached to the encounter.  Ct Head Wo Contrast  Result Date: 08/17/2018 CLINICAL DATA:  Atraumatic headache and neck pain since this morning. EXAM: CT HEAD WITHOUT CONTRAST CT CERVICAL SPINE WITHOUT CONTRAST TECHNIQUE: Multidetector CT imaging of the head and cervical spine was performed following the standard protocol without intravenous contrast. Multiplanar CT image reconstructions of  the cervical spine were also generated. COMPARISON:  06/18/2018 head CT.  01/26/2013 cervical spine CT FINDINGS: CT HEAD FINDINGS Brain: No evidence of parenchymal hemorrhage or extra-axial fluid collection. No mass lesion, mass effect, or midline shift. No CT evidence of acute infarction. Nonspecific mild stable subcortical and periventricular white matter hypodensity, most in keeping with chronic small vessel ischemic change. Cerebral volume is age appropriate. No ventriculomegaly. Vascular: No acute abnormality. Skull: No evidence of calvarial fracture. Sinuses/Orbits: No fluid levels. Mucoperiosteal thickening  in the right greater than left ethmoidal air cells. Other:  The mastoid air cells are unopacified. CT CERVICAL SPINE FINDINGS Alignment: Reversal of the normal cervical lordosis. No facet subluxation. Dens is well positioned between the lateral masses of C1. There is 2 mm anterolisthesis at C3-4, 3 mm anterolisthesis at C4-5, 3 mm retrolisthesis at C5-6 and 2 mm retrolisthesis at C6-7, all worsened since 01/26/2013. Skull base and vertebrae: No acute fracture. No primary bone lesion or focal pathologic process. Soft tissues and spinal canal: No prevertebral edema. No visible canal hematoma. Disc levels: Severe degenerative disc disease at C5-6 and C6-7 with eburnation at these levels, worsened. Mild degenerative disc disease at C4-5. Advanced bilateral facet arthropathy. Mild degenerative foraminal stenosis bilaterally at C5-6 and on the left at C6-7. Mild effacement of the anterior cervical canal at C5-6 by posterior disc osteophytes. Upper chest: No acute abnormality. Other: Visualized mastoid air cells appear clear. Hypodense 1.2 cm anterior right thyroid lobe nodule. No pathologically enlarged cervical nodes. IMPRESSION: CT HEAD: 1.  No evidence of acute intracranial abnormality. 2. Mild chronic small vessel ischemic changes in the cerebral white matter. 3. Mild paranasal sinusitis, chronic appearing. CT CERVICAL SPINE: 1. Severe degenerative disc disease at C5-6 and C6-7 with eburnation at these levels, worsened. 2. Reversal of the normal cervical lordosis. Multilevel spondylolisthesis in the cervical spine as detailed, worsened. If there is clinical concern for ligamentous instability, recommend cervical spine MRI. 3. Advanced bilateral facet arthropathy. Mild degenerative foraminal stenosis as detailed. Electronically Signed   By: Ilona Sorrel M.D.   On: 08/17/2018 21:38   Ct Cervical Spine Wo Contrast  Result Date: 08/17/2018 CLINICAL DATA:  Atraumatic headache and neck pain since this morning. EXAM: CT  HEAD WITHOUT CONTRAST CT CERVICAL SPINE WITHOUT CONTRAST TECHNIQUE: Multidetector CT imaging of the head and cervical spine was performed following the standard protocol without intravenous contrast. Multiplanar CT image reconstructions of the cervical spine were also generated. COMPARISON:  06/18/2018 head CT.  01/26/2013 cervical spine CT FINDINGS: CT HEAD FINDINGS Brain: No evidence of parenchymal hemorrhage or extra-axial fluid collection. No mass lesion, mass effect, or midline shift. No CT evidence of acute infarction. Nonspecific mild stable subcortical and periventricular white matter hypodensity, most in keeping with chronic small vessel ischemic change. Cerebral volume is age appropriate. No ventriculomegaly. Vascular: No acute abnormality. Skull: No evidence of calvarial fracture. Sinuses/Orbits: No fluid levels. Mucoperiosteal thickening in the right greater than left ethmoidal air cells. Other:  The mastoid air cells are unopacified. CT CERVICAL SPINE FINDINGS Alignment: Reversal of the normal cervical lordosis. No facet subluxation. Dens is well positioned between the lateral masses of C1. There is 2 mm anterolisthesis at C3-4, 3 mm anterolisthesis at C4-5, 3 mm retrolisthesis at C5-6 and 2 mm retrolisthesis at C6-7, all worsened since 01/26/2013. Skull base and vertebrae: No acute fracture. No primary bone lesion or focal pathologic process. Soft tissues and spinal canal: No prevertebral edema. No visible canal hematoma. Disc levels: Severe degenerative disc  disease at C5-6 and C6-7 with eburnation at these levels, worsened. Mild degenerative disc disease at C4-5. Advanced bilateral facet arthropathy. Mild degenerative foraminal stenosis bilaterally at C5-6 and on the left at C6-7. Mild effacement of the anterior cervical canal at C5-6 by posterior disc osteophytes. Upper chest: No acute abnormality. Other: Visualized mastoid air cells appear clear. Hypodense 1.2 cm anterior right thyroid lobe nodule.  No pathologically enlarged cervical nodes. IMPRESSION: CT HEAD: 1.  No evidence of acute intracranial abnormality. 2. Mild chronic small vessel ischemic changes in the cerebral white matter. 3. Mild paranasal sinusitis, chronic appearing. CT CERVICAL SPINE: 1. Severe degenerative disc disease at C5-6 and C6-7 with eburnation at these levels, worsened. 2. Reversal of the normal cervical lordosis. Multilevel spondylolisthesis in the cervical spine as detailed, worsened. If there is clinical concern for ligamentous instability, recommend cervical spine MRI. 3. Advanced bilateral facet arthropathy. Mild degenerative foraminal stenosis as detailed. Electronically Signed   By: Ilona Sorrel M.D.   On: 08/17/2018 21:38     Assessment and plan- Patient is a 67 y.o. female with anemia of chronic disease and some component of iron deficiency  Patient's hemoglobin is back to her baseline of 10.6 which is where it has been over the last 2 years.  I suspect this is because of anemia of chronic disease due to her underlying COPD.  Did have a low ferritin level of 33 in the past and took oral iron after her hospitalization following which her hemoglobin did improve from 8-10.  She will continue taking oral iron at this time.  I will see her back in 6 months time with CBC ferritin and iron studies   Visit Diagnosis 1. Anemia of chronic disease   2. Iron deficiency anemia, unspecified iron deficiency anemia type      Dr. Randa Evens, MD, MPH [facility] [phone] 09/07/2018 9:47 AM

## 2018-09-08 ENCOUNTER — Other Ambulatory Visit: Payer: Self-pay | Admitting: Family Medicine

## 2018-09-08 DIAGNOSIS — J441 Chronic obstructive pulmonary disease with (acute) exacerbation: Secondary | ICD-10-CM | POA: Diagnosis not present

## 2018-09-08 DIAGNOSIS — L03115 Cellulitis of right lower limb: Secondary | ICD-10-CM | POA: Diagnosis not present

## 2018-09-08 DIAGNOSIS — I739 Peripheral vascular disease, unspecified: Secondary | ICD-10-CM | POA: Diagnosis not present

## 2018-09-08 DIAGNOSIS — I2510 Atherosclerotic heart disease of native coronary artery without angina pectoris: Secondary | ICD-10-CM | POA: Diagnosis not present

## 2018-09-08 DIAGNOSIS — M4180 Other forms of scoliosis, site unspecified: Secondary | ICD-10-CM | POA: Diagnosis not present

## 2018-09-08 DIAGNOSIS — D509 Iron deficiency anemia, unspecified: Secondary | ICD-10-CM | POA: Diagnosis not present

## 2018-09-08 DIAGNOSIS — I10 Essential (primary) hypertension: Secondary | ICD-10-CM | POA: Diagnosis not present

## 2018-09-08 DIAGNOSIS — L03116 Cellulitis of left lower limb: Secondary | ICD-10-CM | POA: Diagnosis not present

## 2018-09-08 DIAGNOSIS — G609 Hereditary and idiopathic neuropathy, unspecified: Secondary | ICD-10-CM | POA: Diagnosis not present

## 2018-09-09 DIAGNOSIS — D509 Iron deficiency anemia, unspecified: Secondary | ICD-10-CM | POA: Diagnosis not present

## 2018-09-09 DIAGNOSIS — M4180 Other forms of scoliosis, site unspecified: Secondary | ICD-10-CM | POA: Diagnosis not present

## 2018-09-09 DIAGNOSIS — I10 Essential (primary) hypertension: Secondary | ICD-10-CM | POA: Diagnosis not present

## 2018-09-09 DIAGNOSIS — I739 Peripheral vascular disease, unspecified: Secondary | ICD-10-CM | POA: Diagnosis not present

## 2018-09-09 DIAGNOSIS — G609 Hereditary and idiopathic neuropathy, unspecified: Secondary | ICD-10-CM | POA: Diagnosis not present

## 2018-09-09 DIAGNOSIS — I2510 Atherosclerotic heart disease of native coronary artery without angina pectoris: Secondary | ICD-10-CM | POA: Diagnosis not present

## 2018-09-09 DIAGNOSIS — J441 Chronic obstructive pulmonary disease with (acute) exacerbation: Secondary | ICD-10-CM | POA: Diagnosis not present

## 2018-09-09 DIAGNOSIS — L03115 Cellulitis of right lower limb: Secondary | ICD-10-CM | POA: Diagnosis not present

## 2018-09-09 DIAGNOSIS — L03116 Cellulitis of left lower limb: Secondary | ICD-10-CM | POA: Diagnosis not present

## 2018-09-10 DIAGNOSIS — H2511 Age-related nuclear cataract, right eye: Secondary | ICD-10-CM | POA: Diagnosis not present

## 2018-09-11 ENCOUNTER — Other Ambulatory Visit: Payer: Self-pay | Admitting: *Deleted

## 2018-09-11 DIAGNOSIS — G609 Hereditary and idiopathic neuropathy, unspecified: Secondary | ICD-10-CM | POA: Diagnosis not present

## 2018-09-11 DIAGNOSIS — L03115 Cellulitis of right lower limb: Secondary | ICD-10-CM | POA: Diagnosis not present

## 2018-09-11 DIAGNOSIS — M4180 Other forms of scoliosis, site unspecified: Secondary | ICD-10-CM | POA: Diagnosis not present

## 2018-09-11 DIAGNOSIS — I739 Peripheral vascular disease, unspecified: Secondary | ICD-10-CM | POA: Diagnosis not present

## 2018-09-11 DIAGNOSIS — D509 Iron deficiency anemia, unspecified: Secondary | ICD-10-CM | POA: Diagnosis not present

## 2018-09-11 DIAGNOSIS — L03116 Cellulitis of left lower limb: Secondary | ICD-10-CM | POA: Diagnosis not present

## 2018-09-11 DIAGNOSIS — I2510 Atherosclerotic heart disease of native coronary artery without angina pectoris: Secondary | ICD-10-CM | POA: Diagnosis not present

## 2018-09-11 DIAGNOSIS — J441 Chronic obstructive pulmonary disease with (acute) exacerbation: Secondary | ICD-10-CM | POA: Diagnosis not present

## 2018-09-11 DIAGNOSIS — I10 Essential (primary) hypertension: Secondary | ICD-10-CM | POA: Diagnosis not present

## 2018-09-11 NOTE — Patient Outreach (Addendum)
[facility]) Care Management  09/11/2018  Cheryl [name] [birth year] [id number]  Telephone follow up call    Referral from : [facility] Liaison  Referral reason: Admission 6/20-6/27 Dx: Hypoxia,AMS, Pneumonia, abdominal pain . Insurance: [facility] readmission 7/8-7/12, Dx:: Acute on chronic respiratory failure with hypoxia, Acute on chronic COPD exacerbation , Anemia Patient with recent readmission on 7/25-7/29 Dx. AMS ,Acute encephalopathy, Palliative care consult depression , Aspiration pneumonia ,FTT Left eye cataract surgery on 9/26   Successful outreach call to patient , HIPAA confirmed. Patient states that she is doing fine. She discussed her recent left cataract surgery and states everything went well. She discussed plans to have right eye cataract surgery  done on 10/30.  Patient further discussed  COPD She reports only wearing oxygen as needed, has not had to wear it recently. She denies any increase in shortness of breath , cough or lower oxygen saturation less than 88.   Patient reports that her husband continues to help her with daily medications, Reports that her appetite has been good and eating good, state her weight is at 106 lbs.   Fall risk  Continues to use her walker, ,denies a recent fall, she still has bath aide assistance 2 days a week and HHRN once every 2 weeks.   Patient reports improvement in prior redness of lower legs .  Discussed follow up visit , patient agreeable to telephone visit.     Plan  Will plan follow up call in the next 3 weeks after next cataract surgery.  Will assess for additional care coordination care needs prior to transition to health coach.     Egbert Garibaldi, RN, [facility] Care Management,Care Management Coordinator  [phone]- Mobile [phone]- Toll Free Main Office

## 2018-09-13 DIAGNOSIS — J449 Chronic obstructive pulmonary disease, unspecified: Secondary | ICD-10-CM | POA: Diagnosis not present

## 2018-09-14 ENCOUNTER — Other Ambulatory Visit: Payer: Self-pay | Admitting: Family Medicine

## 2018-09-14 DIAGNOSIS — I739 Peripheral vascular disease, unspecified: Secondary | ICD-10-CM | POA: Diagnosis not present

## 2018-09-14 DIAGNOSIS — G4700 Insomnia, unspecified: Secondary | ICD-10-CM

## 2018-09-14 DIAGNOSIS — I2510 Atherosclerotic heart disease of native coronary artery without angina pectoris: Secondary | ICD-10-CM | POA: Diagnosis not present

## 2018-09-14 DIAGNOSIS — M4180 Other forms of scoliosis, site unspecified: Secondary | ICD-10-CM | POA: Diagnosis not present

## 2018-09-14 DIAGNOSIS — L03116 Cellulitis of left lower limb: Secondary | ICD-10-CM | POA: Diagnosis not present

## 2018-09-14 DIAGNOSIS — L03115 Cellulitis of right lower limb: Secondary | ICD-10-CM | POA: Diagnosis not present

## 2018-09-14 DIAGNOSIS — J441 Chronic obstructive pulmonary disease with (acute) exacerbation: Secondary | ICD-10-CM | POA: Diagnosis not present

## 2018-09-14 DIAGNOSIS — D509 Iron deficiency anemia, unspecified: Secondary | ICD-10-CM | POA: Diagnosis not present

## 2018-09-14 DIAGNOSIS — G609 Hereditary and idiopathic neuropathy, unspecified: Secondary | ICD-10-CM | POA: Diagnosis not present

## 2018-09-14 DIAGNOSIS — I10 Essential (primary) hypertension: Secondary | ICD-10-CM | POA: Diagnosis not present

## 2018-09-15 ENCOUNTER — Telehealth: Payer: Self-pay | Admitting: Family Medicine

## 2018-09-15 DIAGNOSIS — I2510 Atherosclerotic heart disease of native coronary artery without angina pectoris: Secondary | ICD-10-CM | POA: Diagnosis not present

## 2018-09-15 DIAGNOSIS — D509 Iron deficiency anemia, unspecified: Secondary | ICD-10-CM | POA: Diagnosis not present

## 2018-09-15 DIAGNOSIS — J449 Chronic obstructive pulmonary disease, unspecified: Secondary | ICD-10-CM | POA: Diagnosis not present

## 2018-09-15 DIAGNOSIS — M4180 Other forms of scoliosis, site unspecified: Secondary | ICD-10-CM | POA: Diagnosis not present

## 2018-09-15 DIAGNOSIS — I10 Essential (primary) hypertension: Secondary | ICD-10-CM | POA: Diagnosis not present

## 2018-09-15 DIAGNOSIS — I739 Peripheral vascular disease, unspecified: Secondary | ICD-10-CM | POA: Diagnosis not present

## 2018-09-15 DIAGNOSIS — G609 Hereditary and idiopathic neuropathy, unspecified: Secondary | ICD-10-CM | POA: Diagnosis not present

## 2018-09-15 DIAGNOSIS — J441 Chronic obstructive pulmonary disease with (acute) exacerbation: Secondary | ICD-10-CM | POA: Diagnosis not present

## 2018-09-15 DIAGNOSIS — L03116 Cellulitis of left lower limb: Secondary | ICD-10-CM | POA: Diagnosis not present

## 2018-09-15 DIAGNOSIS — L03115 Cellulitis of right lower limb: Secondary | ICD-10-CM | POA: Diagnosis not present

## 2018-09-15 MED ORDER — BENZONATATE 100 MG PO CAPS: 100.0000 mg | ORAL_CAPSULE | Freq: Three times a day (TID) | ORAL | 1 refills | Status: DC | PRN

## 2018-09-15 NOTE — Telephone Encounter (Signed)
Pt's husband calling for pt needing the tessalon Perles for pt's coughing at night. Please call pt to let him know if this can be called in or if he will need to bring pt in.  Thanks, [employer]

## 2018-09-16 DIAGNOSIS — G609 Hereditary and idiopathic neuropathy, unspecified: Secondary | ICD-10-CM | POA: Diagnosis not present

## 2018-09-16 DIAGNOSIS — M4180 Other forms of scoliosis, site unspecified: Secondary | ICD-10-CM | POA: Diagnosis not present

## 2018-09-16 DIAGNOSIS — I10 Essential (primary) hypertension: Secondary | ICD-10-CM | POA: Diagnosis not present

## 2018-09-16 DIAGNOSIS — I2510 Atherosclerotic heart disease of native coronary artery without angina pectoris: Secondary | ICD-10-CM | POA: Diagnosis not present

## 2018-09-16 DIAGNOSIS — L03115 Cellulitis of right lower limb: Secondary | ICD-10-CM | POA: Diagnosis not present

## 2018-09-16 DIAGNOSIS — J441 Chronic obstructive pulmonary disease with (acute) exacerbation: Secondary | ICD-10-CM | POA: Diagnosis not present

## 2018-09-16 DIAGNOSIS — L03116 Cellulitis of left lower limb: Secondary | ICD-10-CM | POA: Diagnosis not present

## 2018-09-16 DIAGNOSIS — I739 Peripheral vascular disease, unspecified: Secondary | ICD-10-CM | POA: Diagnosis not present

## 2018-09-16 DIAGNOSIS — D509 Iron deficiency anemia, unspecified: Secondary | ICD-10-CM | POA: Diagnosis not present

## 2018-09-17 ENCOUNTER — Telehealth: Payer: Self-pay | Admitting: Family Medicine

## 2018-09-17 DIAGNOSIS — D509 Iron deficiency anemia, unspecified: Secondary | ICD-10-CM | POA: Diagnosis not present

## 2018-09-17 DIAGNOSIS — G609 Hereditary and idiopathic neuropathy, unspecified: Secondary | ICD-10-CM | POA: Diagnosis not present

## 2018-09-17 DIAGNOSIS — I739 Peripheral vascular disease, unspecified: Secondary | ICD-10-CM | POA: Diagnosis not present

## 2018-09-17 DIAGNOSIS — I2510 Atherosclerotic heart disease of native coronary artery without angina pectoris: Secondary | ICD-10-CM | POA: Diagnosis not present

## 2018-09-17 DIAGNOSIS — I10 Essential (primary) hypertension: Secondary | ICD-10-CM | POA: Diagnosis not present

## 2018-09-17 DIAGNOSIS — M4180 Other forms of scoliosis, site unspecified: Secondary | ICD-10-CM | POA: Diagnosis not present

## 2018-09-17 DIAGNOSIS — J441 Chronic obstructive pulmonary disease with (acute) exacerbation: Secondary | ICD-10-CM | POA: Diagnosis not present

## 2018-09-17 DIAGNOSIS — L03115 Cellulitis of right lower limb: Secondary | ICD-10-CM | POA: Diagnosis not present

## 2018-09-17 DIAGNOSIS — L03116 Cellulitis of left lower limb: Secondary | ICD-10-CM | POA: Diagnosis not present

## 2018-09-17 NOTE — Telephone Encounter (Signed)
Pt 's husband called saying [facility] comes out 2 times a week to bath Mrs. Cheryl [name].  They would like her to come out 3 times a week.  Denice at [facility] told him to call out office and request the change.    Please Advise.    Thanks  [employer]

## 2018-09-17 NOTE — Telephone Encounter (Signed)
Please review request below. KW 

## 2018-09-18 NOTE — Telephone Encounter (Signed)
That's fine

## 2018-09-18 NOTE — Telephone Encounter (Signed)
Patients husband advised. He states he would call us back with the phone number of the person from [facility] who needs the verbal order.

## 2018-09-21 DIAGNOSIS — J441 Chronic obstructive pulmonary disease with (acute) exacerbation: Secondary | ICD-10-CM | POA: Diagnosis not present

## 2018-09-21 DIAGNOSIS — M4180 Other forms of scoliosis, site unspecified: Secondary | ICD-10-CM | POA: Diagnosis not present

## 2018-09-21 DIAGNOSIS — I2510 Atherosclerotic heart disease of native coronary artery without angina pectoris: Secondary | ICD-10-CM | POA: Diagnosis not present

## 2018-09-21 DIAGNOSIS — L03115 Cellulitis of right lower limb: Secondary | ICD-10-CM | POA: Diagnosis not present

## 2018-09-21 DIAGNOSIS — I10 Essential (primary) hypertension: Secondary | ICD-10-CM | POA: Diagnosis not present

## 2018-09-21 DIAGNOSIS — D509 Iron deficiency anemia, unspecified: Secondary | ICD-10-CM | POA: Diagnosis not present

## 2018-09-21 DIAGNOSIS — G609 Hereditary and idiopathic neuropathy, unspecified: Secondary | ICD-10-CM | POA: Diagnosis not present

## 2018-09-21 DIAGNOSIS — L03116 Cellulitis of left lower limb: Secondary | ICD-10-CM | POA: Diagnosis not present

## 2018-09-21 DIAGNOSIS — I739 Peripheral vascular disease, unspecified: Secondary | ICD-10-CM | POA: Diagnosis not present

## 2018-09-22 DIAGNOSIS — I739 Peripheral vascular disease, unspecified: Secondary | ICD-10-CM | POA: Diagnosis not present

## 2018-09-22 DIAGNOSIS — L03116 Cellulitis of left lower limb: Secondary | ICD-10-CM | POA: Diagnosis not present

## 2018-09-22 DIAGNOSIS — I2510 Atherosclerotic heart disease of native coronary artery without angina pectoris: Secondary | ICD-10-CM | POA: Diagnosis not present

## 2018-09-22 DIAGNOSIS — G609 Hereditary and idiopathic neuropathy, unspecified: Secondary | ICD-10-CM | POA: Diagnosis not present

## 2018-09-22 DIAGNOSIS — D509 Iron deficiency anemia, unspecified: Secondary | ICD-10-CM | POA: Diagnosis not present

## 2018-09-22 DIAGNOSIS — M4180 Other forms of scoliosis, site unspecified: Secondary | ICD-10-CM | POA: Diagnosis not present

## 2018-09-22 DIAGNOSIS — J441 Chronic obstructive pulmonary disease with (acute) exacerbation: Secondary | ICD-10-CM | POA: Diagnosis not present

## 2018-09-22 DIAGNOSIS — I10 Essential (primary) hypertension: Secondary | ICD-10-CM | POA: Diagnosis not present

## 2018-09-22 DIAGNOSIS — L03115 Cellulitis of right lower limb: Secondary | ICD-10-CM | POA: Diagnosis not present

## 2018-09-22 NOTE — Telephone Encounter (Signed)
Verbal order given to [facility].

## 2018-09-23 ENCOUNTER — Ambulatory Visit
Admission: RE | Admit: 2018-09-23 | Discharge: 2018-09-23 | Disposition: A | Discharge status: Home / Self Care | Payer: Medicare HMO | Source: Ambulatory Visit | Attending: Ophthalmology | Admitting: Ophthalmology

## 2018-09-23 ENCOUNTER — Other Ambulatory Visit: Payer: Self-pay | Admitting: Family Medicine

## 2018-09-23 ENCOUNTER — Ambulatory Visit: Payer: Medicare HMO | Admitting: Anesthesiology

## 2018-09-23 ENCOUNTER — Encounter
Admission: RE | Disposition: A | Discharge status: Home / Self Care | Payer: Self-pay | Source: Ambulatory Visit | Attending: Ophthalmology

## 2018-09-23 ENCOUNTER — Encounter: Payer: Self-pay | Admitting: Ophthalmology

## 2018-09-23 ENCOUNTER — Other Ambulatory Visit: Payer: Self-pay

## 2018-09-23 DIAGNOSIS — Z9981 Dependence on supplemental oxygen: Secondary | ICD-10-CM | POA: Diagnosis not present

## 2018-09-23 DIAGNOSIS — Z95820 Peripheral vascular angioplasty status with implants and grafts: Secondary | ICD-10-CM | POA: Diagnosis not present

## 2018-09-23 DIAGNOSIS — I110 Hypertensive heart disease with heart failure: Secondary | ICD-10-CM | POA: Insufficient documentation

## 2018-09-23 DIAGNOSIS — I509 Heart failure, unspecified: Secondary | ICD-10-CM | POA: Insufficient documentation

## 2018-09-23 DIAGNOSIS — M6281 Muscle weakness (generalized): Secondary | ICD-10-CM | POA: Diagnosis not present

## 2018-09-23 DIAGNOSIS — E785 Hyperlipidemia, unspecified: Secondary | ICD-10-CM | POA: Diagnosis not present

## 2018-09-23 DIAGNOSIS — J449 Chronic obstructive pulmonary disease, unspecified: Secondary | ICD-10-CM | POA: Diagnosis not present

## 2018-09-23 DIAGNOSIS — I2510 Atherosclerotic heart disease of native coronary artery without angina pectoris: Secondary | ICD-10-CM | POA: Insufficient documentation

## 2018-09-23 DIAGNOSIS — H2511 Age-related nuclear cataract, right eye: Secondary | ICD-10-CM | POA: Diagnosis not present

## 2018-09-23 DIAGNOSIS — F17200 Nicotine dependence, unspecified, uncomplicated: Secondary | ICD-10-CM | POA: Insufficient documentation

## 2018-09-23 DIAGNOSIS — M810 Age-related osteoporosis without current pathological fracture: Secondary | ICD-10-CM | POA: Insufficient documentation

## 2018-09-23 DIAGNOSIS — Z86718 Personal history of other venous thrombosis and embolism: Secondary | ICD-10-CM | POA: Diagnosis not present

## 2018-09-23 DIAGNOSIS — I739 Peripheral vascular disease, unspecified: Secondary | ICD-10-CM | POA: Insufficient documentation

## 2018-09-23 DIAGNOSIS — F0390 Unspecified dementia without behavioral disturbance: Secondary | ICD-10-CM | POA: Diagnosis not present

## 2018-09-23 DIAGNOSIS — K219 Gastro-esophageal reflux disease without esophagitis: Secondary | ICD-10-CM | POA: Diagnosis not present

## 2018-09-23 DIAGNOSIS — R4182 Altered mental status, unspecified: Secondary | ICD-10-CM | POA: Diagnosis not present

## 2018-09-23 DIAGNOSIS — J189 Pneumonia, unspecified organism: Secondary | ICD-10-CM | POA: Diagnosis not present

## 2018-09-23 DIAGNOSIS — J441 Chronic obstructive pulmonary disease with (acute) exacerbation: Secondary | ICD-10-CM | POA: Diagnosis not present

## 2018-09-23 DIAGNOSIS — I5031 Acute diastolic (congestive) heart failure: Secondary | ICD-10-CM | POA: Diagnosis not present

## 2018-09-23 HISTORY — PX: CATARACT EXTRACTION W/PHACO: SHX586

## 2018-09-23 SURGERY — PHACOEMULSIFICATION, CATARACT, WITH IOL INSERTION
Anesthesia: Monitor Anesthesia Care | Site: Eye | Laterality: Right

## 2018-09-23 MED ORDER — POVIDONE-IODINE 5 % OP SOLN
OPHTHALMIC | Status: DC | PRN
  Administered 2018-09-23: 1 via OPHTHALMIC

## 2018-09-23 MED ORDER — MOXIFLOXACIN HCL 0.5 % OP SOLN
OPHTHALMIC | Status: AC
  Filled 2018-09-23: qty 3

## 2018-09-23 MED ORDER — MIDAZOLAM HCL 2 MG/2ML IJ SOLN
INTRAMUSCULAR | Status: AC
  Filled 2018-09-23: qty 2

## 2018-09-23 MED ORDER — EPINEPHRINE PF 1 MG/ML IJ SOLN
INTRAMUSCULAR | Status: AC
  Filled 2018-09-23: qty 2

## 2018-09-23 MED ORDER — SODIUM HYALURONATE 10 MG/ML IO SOLN
INTRAOCULAR | Status: DC | PRN
  Administered 2018-09-23: 0.55 mL via INTRAOCULAR

## 2018-09-23 MED ORDER — LIDOCAINE HCL (PF) 4 % IJ SOLN
INTRAOCULAR | Status: DC | PRN
  Administered 2018-09-23: 4 mL via OPHTHALMIC

## 2018-09-23 MED ORDER — SODIUM HYALURONATE 23 MG/ML IO SOLN
INTRAOCULAR | Status: AC
  Filled 2018-09-23: qty 0.6

## 2018-09-23 MED ORDER — FENTANYL CITRATE (PF) 100 MCG/2ML IJ SOLN
INTRAMUSCULAR | Status: AC
  Filled 2018-09-23: qty 2

## 2018-09-23 MED ORDER — MOXIFLOXACIN HCL 0.5 % OP SOLN: 1.0000 [drp] | OPHTHALMIC | Status: DC | PRN

## 2018-09-23 MED ORDER — MIDAZOLAM HCL 2 MG/2ML IJ SOLN
INTRAMUSCULAR | Status: DC | PRN
  Administered 2018-09-23 (×2): 0.5 mg via INTRAVENOUS

## 2018-09-23 MED ORDER — TETRACAINE HCL 0.5 % OP SOLN
1.0000 [drp] | OPHTHALMIC | Status: DC | PRN
  Administered 2018-09-23 (×3): 1 [drp] via OPHTHALMIC

## 2018-09-23 MED ORDER — SODIUM HYALURONATE 23 MG/ML IO SOLN
INTRAOCULAR | Status: DC | PRN
  Administered 2018-09-23: 0.6 mL via INTRAOCULAR

## 2018-09-23 MED ORDER — FERROUS SULFATE 325 (65 FE) MG PO TABS: 325.0000 mg | ORAL_TABLET | Freq: Two times a day (BID) | ORAL | 3 refills | Status: DC

## 2018-09-23 MED ORDER — SODIUM CHLORIDE 0.9 % IV SOLN
Freq: Once | INTRAVENOUS | Status: AC
  Administered 2018-09-23: 08:00:00 via INTRAVENOUS

## 2018-09-23 MED ORDER — SODIUM CHLORIDE 0.9 % IV SOLN
INTRAVENOUS | Status: DC | PRN
  Administered 2018-09-23: 10:00:00 via INTRAVENOUS

## 2018-09-23 MED ORDER — TETRACAINE HCL 0.5 % OP SOLN
OPHTHALMIC | Status: AC
  Administered 2018-09-23: 1 [drp] via OPHTHALMIC
  Filled 2018-09-23: qty 4

## 2018-09-23 MED ORDER — LIDOCAINE HCL (PF) 4 % IJ SOLN
INTRAMUSCULAR | Status: AC
  Filled 2018-09-23: qty 5

## 2018-09-23 MED ORDER — FENTANYL CITRATE (PF) 100 MCG/2ML IJ SOLN
INTRAMUSCULAR | Status: DC | PRN
  Administered 2018-09-23 (×2): 25 ug via INTRAVENOUS

## 2018-09-23 MED ORDER — EPINEPHRINE PF 1 MG/ML IJ SOLN
INTRAOCULAR | Status: DC | PRN
  Administered 2018-09-23: 10:00:00 via OPHTHALMIC

## 2018-09-23 MED ORDER — ARMC OPHTHALMIC DILATING DROPS
OPHTHALMIC | Status: AC
  Administered 2018-09-23: 1 via OPHTHALMIC
  Filled 2018-09-23: qty 0.5

## 2018-09-23 MED ORDER — CARBACHOL 0.01 % IO SOLN
INTRAOCULAR | Status: DC | PRN
  Administered 2018-09-23: 0.5 mL via INTRAOCULAR

## 2018-09-23 MED ORDER — POVIDONE-IODINE 5 % OP SOLN
OPHTHALMIC | Status: AC
  Filled 2018-09-23: qty 30

## 2018-09-23 MED ORDER — ARMC OPHTHALMIC DILATING DROPS
1.0000 "application " | OPHTHALMIC | Status: AC | PRN
  Administered 2018-09-23 (×3): 1 via OPHTHALMIC

## 2018-09-23 MED ORDER — MOXIFLOXACIN HCL 0.5 % OP SOLN
OPHTHALMIC | Status: DC | PRN
  Administered 2018-09-23: 0.2 mL via OPHTHALMIC

## 2018-09-23 SURGICAL SUPPLY — 18 items
DISSECTOR HYDRO NUCLEUS 50X22 (MISCELLANEOUS) ×3 IMPLANT
GLOVE BIO SURGEON STRL SZ8 (GLOVE) ×3 IMPLANT
GLOVE BIOGEL M 6.5 STRL (GLOVE) ×3 IMPLANT
GLOVE SURG LX 7.5 STRW (GLOVE) ×2
GLOVE SURG LX STRL 7.5 STRW (GLOVE) ×1 IMPLANT
GOWN STRL REUS W/ TWL LRG LVL3 (GOWN DISPOSABLE) ×2 IMPLANT
GOWN STRL REUS W/TWL LRG LVL3 (GOWN DISPOSABLE) ×6
LABEL CATARACT MEDS ST (LABEL) ×3 IMPLANT
LENS IOL TECNIS ITEC 16.0 (Intraocular Lens) ×2 IMPLANT
NDL CAPSULORHEX 25GA (NEEDLE) IMPLANT
NEEDLE CAPSULORHEX 25GA (NEEDLE) ×3 IMPLANT
PACK CATARACT (MISCELLANEOUS) ×3 IMPLANT
PACK CATARACT KING (MISCELLANEOUS) ×3 IMPLANT
PACK EYE AFTER SURG (MISCELLANEOUS) ×3 IMPLANT
SOL BSS BAG (MISCELLANEOUS) ×3
SOLUTION BSS BAG (MISCELLANEOUS) ×1 IMPLANT
WATER STERILE IRR 250ML POUR (IV SOLUTION) ×3 IMPLANT
WIPE NON LINTING 3.25X3.25 (MISCELLANEOUS) ×3 IMPLANT

## 2018-09-23 NOTE — Transfer of Care (Signed)
Immediate Anesthesia Transfer of Care Note  Patient: Cheryl [name]  Procedure(s) Performed: CATARACT EXTRACTION PHACO AND INTRAOCULAR LENS PLACEMENT (IOC) (Right Eye)  Patient Location: PACU  Anesthesia Type:MAC  Level of Consciousness: sedated  Airway & Oxygen Therapy: Patient Spontanous Breathing  Post-op Assessment: Report given to RN and Post -op Vital signs reviewed and stable  Post vital signs: Reviewed and stable  Last Vitals:  Vitals Value Taken Time  BP    Temp    Pulse    Resp    SpO2      Last Pain:  Vitals:   09/23/18 0747  TempSrc: Tympanic  PainSc: 3          Complications: No apparent anesthesia complications

## 2018-09-23 NOTE — Anesthesia Postprocedure Evaluation (Signed)
Anesthesia Post Note  Patient: Cheryl [name]  Procedure(s) Performed: CATARACT EXTRACTION PHACO AND INTRAOCULAR LENS PLACEMENT (IOC) (Right Eye)  Patient location during evaluation: Short Stay Anesthesia Type: MAC Level of consciousness: sedated Pain management: pain level controlled Vital Signs Assessment: post-procedure vital signs reviewed and stable Respiratory status: spontaneous breathing Cardiovascular status: blood pressure returned to baseline Anesthetic complications: no     Last Vitals:  Vitals:   09/23/18 0747  BP: 126/64  Pulse: 98  Resp: 17  Temp: (!) 36.1 C  SpO2: (!) 84%    Last Pain:  Vitals:   09/23/18 0747  TempSrc: Tympanic  PainSc: Cheryl [name]

## 2018-09-23 NOTE — Discharge Instructions (Signed)
Eye Surgery Discharge Instructions    Expect mild scratchy sensation or mild soreness. DO NOT RUB YOUR EYE!  The day of surgery:  Minimal physical activity, but bed rest is not required  No reading, computer work, or close hand work  No bending, lifting, or straining.  May watch TV  For 24 hours:  No driving, legal decisions, or alcoholic beverages  Safety precautions  Eat anything you prefer: It is better to start with liquids, then soup then solid foods.  _____ Eye patch should be worn until postoperative exam tomorrow.  ____ Solar shield eyeglasses should be worn for comfort in the sunlight/patch while sleeping  Resume all regular medications including aspirin or Coumadin if these were discontinued prior to surgery. You may shower, bathe, shave, or wash your hair. Tylenol may be taken for mild discomfort.  Call your doctor if you experience significant pain, nausea, or vomiting, fever > 101 or other signs of infection. [phone] or [phone] Specific instructions:  Follow-up Information    Nevada Crane, MD Follow up on 09/24/2018.   Specialty:  Ophthalmology Why:  appointment time 9:50 AM Contact information: [address] [place] [address] [phone]

## 2018-09-23 NOTE — Anesthesia Post-op Follow-up Note (Signed)
Anesthesia QCDR form completed.        

## 2018-09-23 NOTE — H&P (Signed)
The History and Physical notes are on paper, have been signed, and are to be scanned.   I have examined the patient and there are no changes to the H&P.   Willey Blade 09/23/2018 9:32 AM

## 2018-09-23 NOTE — Anesthesia Preprocedure Evaluation (Signed)
Anesthesia Evaluation  Patient identified by MRN, date of birth, ID band Patient awake    Reviewed: Allergy & Precautions, NPO status , Patient's Chart, lab work & pertinent test results  History of Anesthesia Complications Negative for: history of anesthetic complications  Airway Mallampati: II  TM Distance: >3 FB Neck ROM: Full    Dental  (+) Upper Dentures   Pulmonary neg sleep apnea, COPD (intermittent O2 use at home),  COPD inhaler and oxygen dependent, Current Smoker,    breath sounds clear to auscultation- rhonchi (-) wheezing      Cardiovascular hypertension, + CAD, + Peripheral Vascular Disease and +CHF  (-) Cardiac Stents and (-) CABG + dysrhythmias (PAF)  Rhythm:Regular Rate:Normal - Systolic murmurs and - Diastolic murmurs    Neuro/Psych  Headaches, PSYCHIATRIC DISORDERS Anxiety Depression Dementia    GI/Hepatic Neg liver ROS, GERD  ,  Endo/Other  negative endocrine ROSneg diabetes  Renal/GU negative Renal ROS     Musculoskeletal  (+) Arthritis , Fibromyalgia -  Abdominal (+) - obese,   Peds  Hematology  (+) anemia ,   Anesthesia Other Findings Past Medical History: 04/14/2017: Acute postoperative pain No date: Allergy No date: Anemia No date: Anxiety No date: Arthritis     Comment:  fingers 11/24/2015: Aspiration pneumonitis (HCC) No date: Asthma No date: CHF (congestive heart failure) (HCC) No date: Chronic pain No date: COPD (chronic obstructive pulmonary disease) (HCC) No date: Coronary artery disease No date: DVT (deep venous thrombosis) (HCC) No date: Dyspnea No date: Dysrhythmia No date: Encephalopathy     Comment:  MULTIPLE TIMES No date: GERD (gastroesophageal reflux disease) No date: Headache No date: Hyperlipidemia No date: Hypertension No date: Kyphoscoliosis deformity of spine No date: Migraines 2010: Neuropathy No date: Osteoporosis No date: Oxygen deficiency No date:  Peripheral vascular disease ([place]) No date: Pneumonia 11/19/2015: Pneumonia 10/2016: Pneumonia No date: Pneumonia     Comment:  aspiration 2018- 4 times in last year No date: Vitamin D deficiency No date: Wears dentures     Comment:  full upper and lower   Reproductive/Obstetrics                             Anesthesia Physical Anesthesia Plan  ASA: III  Anesthesia Plan: MAC   Post-op Pain Management:    Induction: Intravenous  PONV Risk Score and Plan: 2 and Midazolam  Airway Management Planned: Natural Airway  Additional Equipment:   Intra-op Plan:   Post-operative Plan:   Informed Consent: I have reviewed the patients History and Physical, chart, labs and discussed the procedure including the risks, benefits and alternatives for the proposed anesthesia with the patient or authorized representative who has indicated his/her understanding and acceptance.     Plan Discussed with: CRNA and Anesthesiologist  Anesthesia Plan Comments:         Anesthesia Quick Evaluation

## 2018-09-23 NOTE — Telephone Encounter (Signed)
[facility] faxed refill request for the following medications:  ferrous sulfate 325 (65 FE) MG tablet  Qty: 60  Last dispensed: 08/25/2018  Please advise.

## 2018-09-23 NOTE — Op Note (Signed)
OPERATIVE NOTE  Cheryl [name] [id number] 09/23/2018   PREOPERATIVE DIAGNOSIS:  Nuclear sclerotic cataract right eye.  H25.11   POSTOPERATIVE DIAGNOSIS:    Nuclear sclerotic cataract right eye.     PROCEDURE:  Phacoemusification with posterior chamber intraocular lens placement of the right eye   LENS:   Implant Name Type Inv. Item Serial No. Manufacturer Lot No. LRB No. Used  LENS IOL DIOP 16.0 - [id number] Intraocular Lens LENS IOL DIOP 16.0 [id number] AMO  Right 1       PCB00 +16.0   ULTRASOUND TIME: 0 minutes 45 seconds.  CDE 5.19   SURGEON:  Willey Blade, MD, MPH  ANESTHESIOLOGIST: Anesthesiologist: Alver Fisher, MD CRNA: Danelle Berry, CRNA   ANESTHESIA:  Topical with tetracaine drops augmented with 1% preservative-free intracameral lidocaine.  ESTIMATED BLOOD LOSS: less than 1 mL.   COMPLICATIONS:  None.   DESCRIPTION OF PROCEDURE:  The patient was identified in the holding room and transported to the operating room and placed in the supine position under the operating microscope.  The right eye was identified as the operative eye and it was prepped and draped in the usual sterile ophthalmic fashion.   A 1.0 millimeter clear-corneal paracentesis was made at the 10:30 position. 0.5 ml of preservative-free 1% lidocaine with epinephrine was injected into the anterior chamber.  The anterior chamber was filled with Healon 5 viscoelastic.  A 2.4 millimeter keratome was used to make a near-clear corneal incision at the 8:00 position.  A curvilinear capsulorrhexis was made with a cystotome and capsulorrhexis forceps.  Balanced salt solution was used to hydrodissect and hydrodelineate the nucleus.   Phacoemulsification was then used in stop and chop fashion to remove the lens nucleus and epinucleus.  The remaining cortex was then removed using the irrigation and aspiration handpiece. Healon was then placed into the capsular bag to distend it for lens placement.  A lens was  then injected into the capsular bag.  The remaining viscoelastic was aspirated.   Wounds were hydrated with balanced salt solution.  The anterior chamber was inflated to a physiologic pressure with balanced salt solution.   Intracameral vigamox 0.1 mL undiluted was injected into the eye and a drop placed onto the ocular surface.  No wound leaks were noted.  The patient was taken to the recovery room in stable condition without complications of anesthesia or surgery  Willey Blade 09/23/2018, 10:05 AM

## 2018-09-25 DIAGNOSIS — G609 Hereditary and idiopathic neuropathy, unspecified: Secondary | ICD-10-CM | POA: Diagnosis not present

## 2018-09-25 DIAGNOSIS — M4180 Other forms of scoliosis, site unspecified: Secondary | ICD-10-CM | POA: Diagnosis not present

## 2018-09-25 DIAGNOSIS — I10 Essential (primary) hypertension: Secondary | ICD-10-CM | POA: Diagnosis not present

## 2018-09-25 DIAGNOSIS — I739 Peripheral vascular disease, unspecified: Secondary | ICD-10-CM | POA: Diagnosis not present

## 2018-09-25 DIAGNOSIS — L03115 Cellulitis of right lower limb: Secondary | ICD-10-CM | POA: Diagnosis not present

## 2018-09-25 DIAGNOSIS — D509 Iron deficiency anemia, unspecified: Secondary | ICD-10-CM | POA: Diagnosis not present

## 2018-09-25 DIAGNOSIS — I2510 Atherosclerotic heart disease of native coronary artery without angina pectoris: Secondary | ICD-10-CM | POA: Diagnosis not present

## 2018-09-25 DIAGNOSIS — L03116 Cellulitis of left lower limb: Secondary | ICD-10-CM | POA: Diagnosis not present

## 2018-09-25 DIAGNOSIS — J441 Chronic obstructive pulmonary disease with (acute) exacerbation: Secondary | ICD-10-CM | POA: Diagnosis not present

## 2018-09-28 ENCOUNTER — Other Ambulatory Visit: Payer: Self-pay | Admitting: Family Medicine

## 2018-09-28 DIAGNOSIS — I739 Peripheral vascular disease, unspecified: Secondary | ICD-10-CM | POA: Diagnosis not present

## 2018-09-28 DIAGNOSIS — M79604 Pain in right leg: Secondary | ICD-10-CM

## 2018-09-28 DIAGNOSIS — J441 Chronic obstructive pulmonary disease with (acute) exacerbation: Secondary | ICD-10-CM | POA: Diagnosis not present

## 2018-09-28 DIAGNOSIS — G609 Hereditary and idiopathic neuropathy, unspecified: Secondary | ICD-10-CM | POA: Diagnosis not present

## 2018-09-28 DIAGNOSIS — L03116 Cellulitis of left lower limb: Secondary | ICD-10-CM | POA: Diagnosis not present

## 2018-09-28 DIAGNOSIS — I2510 Atherosclerotic heart disease of native coronary artery without angina pectoris: Secondary | ICD-10-CM | POA: Diagnosis not present

## 2018-09-28 DIAGNOSIS — I10 Essential (primary) hypertension: Secondary | ICD-10-CM | POA: Diagnosis not present

## 2018-09-28 DIAGNOSIS — L03115 Cellulitis of right lower limb: Secondary | ICD-10-CM | POA: Diagnosis not present

## 2018-09-28 DIAGNOSIS — D509 Iron deficiency anemia, unspecified: Secondary | ICD-10-CM | POA: Diagnosis not present

## 2018-09-28 DIAGNOSIS — M4180 Other forms of scoliosis, site unspecified: Secondary | ICD-10-CM | POA: Diagnosis not present

## 2018-09-29 ENCOUNTER — Other Ambulatory Visit: Payer: Self-pay | Admitting: *Deleted

## 2018-09-29 NOTE — Patient Outreach (Signed)
[facility]) Care Management  09/29/2018  Cheryl [name] 01-09-1951 [id number]  Referral reason: Admission 6/20-6/27 Dx: Hypoxia,AMS, Pneumonia, abdominal pain . Insurance: [facility] readmission 7/8-7/12, Dx:: Acute on chronic respiratory failure with hypoxia, Acute on chronic COPD exacerbation , Anemia Patient with recent readmission on 7/25-7/29 Dx. AMS ,Acute encephalopathy, Palliative care consult depression , Aspiration pneumonia ,FTT Left eye cataract surgery on 9/26 Right eye cataract surgery on 09/23/18   Telephone outreach call  Successful outreach call to patient.  Patient discussed doing fine.  Patient discussed her recent cataract surgery and glad she has had both eyes done.    Patient discussed :  COPD Reports continues to wear oxygen at 2 liters only as needed, reports checking her oxygen level on an off. Patient reports continuing to take respiratory treatments as prescribed. Discussed worsening symptoms of COPD, states she is breathing in her usual range on today, she denies increased shortness of breath, no worsening of cough or change in color of sputum. Patient states I know when to call the doctor and my husband will make sure.   Fall risk Patient denies recent fall, reports last fall over 30 days ago. Patient continues to use her walker in home and when going out to her porch.    Social  Patient states her sister visits her daily and helps as needed, patient husband continues to assist as needed.  Patient still has [facility] RN that visit every other week, and bath aide 3 days a week. Discussed with patient bath aide and home health services are not typically a long term services  Discussed with patient it may be beneficial in looking into other resources for bath aide patient patient declined . Patient states that she is not interesting in that at this time, she likes services and bath aide working with her at this time.    Patient declined other needs at this time, discussed next transition for continued education and support of chronic condition of COPD, the health coach program. Patient declines any further telephonic follow up calls at this time, discussed other calls she receives, palliative care and home health visits. Verified [facility] contact numbers available .  Verified patient aware to contact MD for worsening symptoms of COPD and , 911 for urgent/emergency needs.    Plan  Will close case to community care coordinator , for complex case management .  Will send PCP case closure letter.    Egbert Garibaldi, RN, [facility] Care Management,Care Management Coordinator  [phone]- Mobile [phone]- Toll Free Main Office

## 2018-09-30 DIAGNOSIS — I739 Peripheral vascular disease, unspecified: Secondary | ICD-10-CM | POA: Diagnosis not present

## 2018-09-30 DIAGNOSIS — D509 Iron deficiency anemia, unspecified: Secondary | ICD-10-CM | POA: Diagnosis not present

## 2018-09-30 DIAGNOSIS — I2510 Atherosclerotic heart disease of native coronary artery without angina pectoris: Secondary | ICD-10-CM | POA: Diagnosis not present

## 2018-09-30 DIAGNOSIS — M4180 Other forms of scoliosis, site unspecified: Secondary | ICD-10-CM | POA: Diagnosis not present

## 2018-09-30 DIAGNOSIS — J441 Chronic obstructive pulmonary disease with (acute) exacerbation: Secondary | ICD-10-CM | POA: Diagnosis not present

## 2018-09-30 DIAGNOSIS — G609 Hereditary and idiopathic neuropathy, unspecified: Secondary | ICD-10-CM | POA: Diagnosis not present

## 2018-09-30 DIAGNOSIS — L03115 Cellulitis of right lower limb: Secondary | ICD-10-CM | POA: Diagnosis not present

## 2018-09-30 DIAGNOSIS — L03116 Cellulitis of left lower limb: Secondary | ICD-10-CM | POA: Diagnosis not present

## 2018-09-30 DIAGNOSIS — I10 Essential (primary) hypertension: Secondary | ICD-10-CM | POA: Diagnosis not present

## 2018-10-02 DIAGNOSIS — G609 Hereditary and idiopathic neuropathy, unspecified: Secondary | ICD-10-CM | POA: Diagnosis not present

## 2018-10-02 DIAGNOSIS — I2510 Atherosclerotic heart disease of native coronary artery without angina pectoris: Secondary | ICD-10-CM | POA: Diagnosis not present

## 2018-10-02 DIAGNOSIS — L03116 Cellulitis of left lower limb: Secondary | ICD-10-CM | POA: Diagnosis not present

## 2018-10-02 DIAGNOSIS — J441 Chronic obstructive pulmonary disease with (acute) exacerbation: Secondary | ICD-10-CM | POA: Diagnosis not present

## 2018-10-02 DIAGNOSIS — I10 Essential (primary) hypertension: Secondary | ICD-10-CM | POA: Diagnosis not present

## 2018-10-02 DIAGNOSIS — D509 Iron deficiency anemia, unspecified: Secondary | ICD-10-CM | POA: Diagnosis not present

## 2018-10-02 DIAGNOSIS — L03115 Cellulitis of right lower limb: Secondary | ICD-10-CM | POA: Diagnosis not present

## 2018-10-02 DIAGNOSIS — I739 Peripheral vascular disease, unspecified: Secondary | ICD-10-CM | POA: Diagnosis not present

## 2018-10-02 DIAGNOSIS — M4180 Other forms of scoliosis, site unspecified: Secondary | ICD-10-CM | POA: Diagnosis not present

## 2018-10-07 DIAGNOSIS — I2510 Atherosclerotic heart disease of native coronary artery without angina pectoris: Secondary | ICD-10-CM | POA: Diagnosis not present

## 2018-10-07 DIAGNOSIS — J441 Chronic obstructive pulmonary disease with (acute) exacerbation: Secondary | ICD-10-CM | POA: Diagnosis not present

## 2018-10-07 DIAGNOSIS — G609 Hereditary and idiopathic neuropathy, unspecified: Secondary | ICD-10-CM | POA: Diagnosis not present

## 2018-10-07 DIAGNOSIS — I739 Peripheral vascular disease, unspecified: Secondary | ICD-10-CM | POA: Diagnosis not present

## 2018-10-07 DIAGNOSIS — I10 Essential (primary) hypertension: Secondary | ICD-10-CM | POA: Diagnosis not present

## 2018-10-07 DIAGNOSIS — L03115 Cellulitis of right lower limb: Secondary | ICD-10-CM | POA: Diagnosis not present

## 2018-10-07 DIAGNOSIS — M4180 Other forms of scoliosis, site unspecified: Secondary | ICD-10-CM | POA: Diagnosis not present

## 2018-10-07 DIAGNOSIS — L03116 Cellulitis of left lower limb: Secondary | ICD-10-CM | POA: Diagnosis not present

## 2018-10-07 DIAGNOSIS — D509 Iron deficiency anemia, unspecified: Secondary | ICD-10-CM | POA: Diagnosis not present

## 2018-10-09 DIAGNOSIS — D509 Iron deficiency anemia, unspecified: Secondary | ICD-10-CM | POA: Diagnosis not present

## 2018-10-09 DIAGNOSIS — I739 Peripheral vascular disease, unspecified: Secondary | ICD-10-CM | POA: Diagnosis not present

## 2018-10-09 DIAGNOSIS — J449 Chronic obstructive pulmonary disease, unspecified: Secondary | ICD-10-CM | POA: Diagnosis not present

## 2018-10-09 DIAGNOSIS — G8929 Other chronic pain: Secondary | ICD-10-CM | POA: Diagnosis not present

## 2018-10-09 DIAGNOSIS — G609 Hereditary and idiopathic neuropathy, unspecified: Secondary | ICD-10-CM | POA: Diagnosis not present

## 2018-10-09 DIAGNOSIS — M4180 Other forms of scoliosis, site unspecified: Secondary | ICD-10-CM | POA: Diagnosis not present

## 2018-10-09 DIAGNOSIS — I10 Essential (primary) hypertension: Secondary | ICD-10-CM | POA: Diagnosis not present

## 2018-10-09 DIAGNOSIS — F17210 Nicotine dependence, cigarettes, uncomplicated: Secondary | ICD-10-CM | POA: Diagnosis not present

## 2018-10-09 DIAGNOSIS — I2510 Atherosclerotic heart disease of native coronary artery without angina pectoris: Secondary | ICD-10-CM | POA: Diagnosis not present

## 2018-10-12 DIAGNOSIS — G609 Hereditary and idiopathic neuropathy, unspecified: Secondary | ICD-10-CM | POA: Diagnosis not present

## 2018-10-12 DIAGNOSIS — F17210 Nicotine dependence, cigarettes, uncomplicated: Secondary | ICD-10-CM | POA: Diagnosis not present

## 2018-10-12 DIAGNOSIS — I739 Peripheral vascular disease, unspecified: Secondary | ICD-10-CM | POA: Diagnosis not present

## 2018-10-12 DIAGNOSIS — I2510 Atherosclerotic heart disease of native coronary artery without angina pectoris: Secondary | ICD-10-CM | POA: Diagnosis not present

## 2018-10-12 DIAGNOSIS — I10 Essential (primary) hypertension: Secondary | ICD-10-CM | POA: Diagnosis not present

## 2018-10-12 DIAGNOSIS — M4180 Other forms of scoliosis, site unspecified: Secondary | ICD-10-CM | POA: Diagnosis not present

## 2018-10-12 DIAGNOSIS — J449 Chronic obstructive pulmonary disease, unspecified: Secondary | ICD-10-CM | POA: Diagnosis not present

## 2018-10-12 DIAGNOSIS — G8929 Other chronic pain: Secondary | ICD-10-CM | POA: Diagnosis not present

## 2018-10-12 DIAGNOSIS — D509 Iron deficiency anemia, unspecified: Secondary | ICD-10-CM | POA: Diagnosis not present

## 2018-10-14 DIAGNOSIS — I739 Peripheral vascular disease, unspecified: Secondary | ICD-10-CM | POA: Diagnosis not present

## 2018-10-14 DIAGNOSIS — M4180 Other forms of scoliosis, site unspecified: Secondary | ICD-10-CM | POA: Diagnosis not present

## 2018-10-14 DIAGNOSIS — D509 Iron deficiency anemia, unspecified: Secondary | ICD-10-CM | POA: Diagnosis not present

## 2018-10-14 DIAGNOSIS — G8929 Other chronic pain: Secondary | ICD-10-CM | POA: Diagnosis not present

## 2018-10-14 DIAGNOSIS — G609 Hereditary and idiopathic neuropathy, unspecified: Secondary | ICD-10-CM | POA: Diagnosis not present

## 2018-10-14 DIAGNOSIS — I10 Essential (primary) hypertension: Secondary | ICD-10-CM | POA: Diagnosis not present

## 2018-10-14 DIAGNOSIS — I2510 Atherosclerotic heart disease of native coronary artery without angina pectoris: Secondary | ICD-10-CM | POA: Diagnosis not present

## 2018-10-14 DIAGNOSIS — F17210 Nicotine dependence, cigarettes, uncomplicated: Secondary | ICD-10-CM | POA: Diagnosis not present

## 2018-10-14 DIAGNOSIS — J449 Chronic obstructive pulmonary disease, unspecified: Secondary | ICD-10-CM | POA: Diagnosis not present

## 2018-10-16 DIAGNOSIS — I2510 Atherosclerotic heart disease of native coronary artery without angina pectoris: Secondary | ICD-10-CM | POA: Diagnosis not present

## 2018-10-16 DIAGNOSIS — I10 Essential (primary) hypertension: Secondary | ICD-10-CM | POA: Diagnosis not present

## 2018-10-16 DIAGNOSIS — J449 Chronic obstructive pulmonary disease, unspecified: Secondary | ICD-10-CM | POA: Diagnosis not present

## 2018-10-16 DIAGNOSIS — D509 Iron deficiency anemia, unspecified: Secondary | ICD-10-CM | POA: Diagnosis not present

## 2018-10-16 DIAGNOSIS — I739 Peripheral vascular disease, unspecified: Secondary | ICD-10-CM | POA: Diagnosis not present

## 2018-10-16 DIAGNOSIS — G8929 Other chronic pain: Secondary | ICD-10-CM | POA: Diagnosis not present

## 2018-10-16 DIAGNOSIS — F17210 Nicotine dependence, cigarettes, uncomplicated: Secondary | ICD-10-CM | POA: Diagnosis not present

## 2018-10-16 DIAGNOSIS — G609 Hereditary and idiopathic neuropathy, unspecified: Secondary | ICD-10-CM | POA: Diagnosis not present

## 2018-10-16 DIAGNOSIS — M4180 Other forms of scoliosis, site unspecified: Secondary | ICD-10-CM | POA: Diagnosis not present

## 2018-10-19 DIAGNOSIS — D509 Iron deficiency anemia, unspecified: Secondary | ICD-10-CM | POA: Diagnosis not present

## 2018-10-19 DIAGNOSIS — M4180 Other forms of scoliosis, site unspecified: Secondary | ICD-10-CM | POA: Diagnosis not present

## 2018-10-19 DIAGNOSIS — G8929 Other chronic pain: Secondary | ICD-10-CM | POA: Diagnosis not present

## 2018-10-19 DIAGNOSIS — I2510 Atherosclerotic heart disease of native coronary artery without angina pectoris: Secondary | ICD-10-CM | POA: Diagnosis not present

## 2018-10-19 DIAGNOSIS — I10 Essential (primary) hypertension: Secondary | ICD-10-CM | POA: Diagnosis not present

## 2018-10-19 DIAGNOSIS — J449 Chronic obstructive pulmonary disease, unspecified: Secondary | ICD-10-CM | POA: Diagnosis not present

## 2018-10-19 DIAGNOSIS — G609 Hereditary and idiopathic neuropathy, unspecified: Secondary | ICD-10-CM | POA: Diagnosis not present

## 2018-10-19 DIAGNOSIS — I739 Peripheral vascular disease, unspecified: Secondary | ICD-10-CM | POA: Diagnosis not present

## 2018-10-19 DIAGNOSIS — F17210 Nicotine dependence, cigarettes, uncomplicated: Secondary | ICD-10-CM | POA: Diagnosis not present

## 2018-10-20 ENCOUNTER — Encounter: Payer: Self-pay | Admitting: Physician Assistant

## 2018-10-20 ENCOUNTER — Ambulatory Visit (INDEPENDENT_AMBULATORY_CARE_PROVIDER_SITE_OTHER): Payer: Medicare HMO | Admitting: Physician Assistant

## 2018-10-20 VITALS — BP 130/70 | HR 86 | Temp 98.9°F | Resp 16 | Wt 106.0 lb

## 2018-10-20 DIAGNOSIS — J441 Chronic obstructive pulmonary disease with (acute) exacerbation: Secondary | ICD-10-CM

## 2018-10-20 MED ORDER — DOXYCYCLINE HYCLATE 100 MG PO TABS: 100.0000 mg | ORAL_TABLET | Freq: Two times a day (BID) | ORAL | 0 refills | Status: AC

## 2018-10-20 MED ORDER — PREDNISONE 10 MG (21) PO TBPK: ORAL_TABLET | ORAL | 0 refills | Status: DC

## 2018-10-20 NOTE — Patient Instructions (Signed)

## 2018-10-20 NOTE — Progress Notes (Signed)
Acute Office Visit  Subjective:    Patient ID: Cheryl [name], female    DOB: [birth year], 67 y.o.   MRN: [id number]  Chief Complaint  Patient presents with  . URI    HPI Patient with history of COPD, current tobacco abuse, 3L oxygen nasal cannula as needed is in today for Upper Respiratory Infection: Patient complains of symptoms of a URI. Symptoms include cough. Onset of symptoms was 4 days ago, gradually worsening since that time. She also c/o congestion, post nasal drip, productive cough with  clear colored sputum and purulent nasal discharge for the past 1 days .  She is drinking plenty of fluids. Evaluation to date: none. Treatment to date: none.   Past Medical History:  Diagnosis Date  . Acute postoperative pain 04/14/2017  . Allergy   . Anemia   . Anxiety   . Arthritis    fingers  . Aspiration pneumonitis ([place]) 11/24/2015  . Asthma   . CHF (congestive heart failure) ([place])   . Chronic pain   . COPD (chronic obstructive pulmonary disease) ([place])   . Coronary artery disease   . DVT (deep venous thrombosis) ([place])   . Dyspnea   . Dysrhythmia   . Encephalopathy    MULTIPLE TIMES  . GERD (gastroesophageal reflux disease)   . Headache   . Hyperlipidemia   . Hypertension   . Kyphoscoliosis deformity of spine   . Migraines   . Neuropathy 2010  . Osteoporosis   . Oxygen deficiency   . Peripheral vascular disease ([place])   . Pneumonia   . Pneumonia 11/19/2015  . Pneumonia 10/2016  . Pneumonia    aspiration 2018- 4 times in last year  . Vitamin D deficiency   . Wears dentures    full upper and lower    Past Surgical History:  Procedure Laterality Date  . ABDOMINAL HYSTERECTOMY  1975   Bilaterl Oophorectomy; [place] to IUD infection  . abdomnal aortic stent  05/30/2008   Dr. Quay Burow  . APPENDECTOMY    . APPENDECTOMY    . BACK SURGERY    . cardiac catherization  10/31/2009  . CARDIAC CATHETERIZATION    . CATARACT EXTRACTION W/PHACO Left 08/20/2018   Procedure:  CATARACT EXTRACTION PHACO AND INTRAOCULAR LENS PLACEMENT (IOC);  Surgeon: Eulogio Bear, MD;  Location: [facility] ORS;  Service: Ophthalmology;  Laterality: Left;  [place] 00:45.1 AP% 12.3 CDE 5.54 FLUID PACK LOT # [id number]  . CATARACT EXTRACTION W/PHACO Right 09/23/2018   Procedure: CATARACT EXTRACTION PHACO AND INTRAOCULAR LENS PLACEMENT ([place]);  Surgeon: Eulogio Bear, MD;  Location: [facility] ORS;  Service: Ophthalmology;  Laterality: Right;  [place] 00:45.0 CDE 5.19 Fluid Pack lot # [id number]  . CERVICAL FUSION  C5 - 6/C6-7  . CHOLECYSTECTOMY  1972  . COLONOSCOPY WITH PROPOFOL N/A 07/27/2015   Procedure: COLONOSCOPY WITH PROPOFOL;  Surgeon: Hulen Luster, MD;  Location: [facility] ENDOSCOPY;  Service: Gastroenterology;  Laterality: N/A;  . ESOPHAGOGASTRODUODENOSCOPY (EGD) WITH PROPOFOL N/A 07/27/2015   Procedure: ESOPHAGOGASTRODUODENOSCOPY (EGD) WITH PROPOFOL;  Surgeon: Hulen Luster, MD;  Location[facility] ENDOSCOPY;  Service: Gastroenterology;  Laterality: N/A;  . ESOPHAGOGASTRODUODENOSCOPY (EGD) WITH PROPOFOL N/A 05/19/2018   Procedure: ESOPHAGOGASTRODUODENOSCOPY (EGD) WITH PROPOFOL;  Surgeon: Lucilla Lame, MD;  Location: [facility] ENDOSCOPY;  Service: Endoscopy;  Laterality: N/A;  . EYE SURGERY    . FOOT SURGERY Bilateral    5-6 years per patient  . SPINE SURGERY    . TONSILLECTOMY    . VASCULAR SURGERY  LEG STENTS    Family History  Problem Relation Age of Onset  . Cancer Mother   . Arthritis Mother   . Heart disease Mother   . Diabetes Mother        mellitus, type 2  . Heart disease Father   . Diabetes Sister   . Cancer Brother   . Cancer Brother        lung  . Diabetes Brother     Social History   Socioeconomic History  . Marital status: Married    Spouse name: Not on file  . Number of children: 6  . Years of education: Not on file  . Highest education level: Not on file  Occupational History  . Occupation: Disabled  Social Needs  . Financial resource strain: Not on file  . Food  insecurity:    Worry: Not on file    Inability: Not on file  . Transportation needs:    Medical: Not on file    Non-medical: Not on file  Tobacco Use  . Smoking status: Current Every Day Smoker    Packs/day: 1.00    Years: 50.00    Pack years: 50.00    Types: Cigarettes  . Smokeless tobacco: Never Used  . Tobacco comment: Previously smoked 2 ppd  Substance and Sexual Activity  . Alcohol use: No    Alcohol/week: 0.0 standard drinks  . Drug use: No  . Sexual activity: Never  Lifestyle  . Physical activity:    Days per week: Not on file    Minutes per session: Not on file  . Stress: Not on file  Relationships  . Social connections:    Talks on phone: Not on file    Gets together: Not on file    Attends religious service: Not on file    Active member of club or organization: Not on file    Attends meetings of clubs or organizations: Not on file    Relationship status: Not on file  . Intimate partner violence:    Fear of current or ex partner: Not on file    Emotionally abused: Not on file    Physically abused: Not on file    Forced sexual activity: Not on file  Other Topics Concern  . Not on file  Social History Narrative  . Not on file    Outpatient Medications Prior to Visit  Medication Sig Dispense Refill  . acetaminophen (TYLENOL) 325 MG tablet Take 2 tablets (650 mg total) by mouth every 6 (six) hours as needed for mild pain (or Fever >/= 101).    Marland Kitchen albuterol (PROVENTIL) (2.5 MG/3ML) 0.083% nebulizer solution Take 3 mLs (2.5 mg total) by nebulization every 4 (four) hours as needed for wheezing. 50 vial 12  . alendronate (FOSAMAX) 70 MG tablet Take 1 tablet (70 mg total) by mouth every 7 (seven) days. Take with a full glass of water on an empty stomach. (Patient taking differently: Take 70 mg by mouth every Monday. Take with a full glass of water on an empty stomach.) 4 tablet 11  . ALPRAZolam (XANAX) 0.5 MG tablet Take 0.5-1 tablets (0.25-0.5 mg total) by mouth at  bedtime as needed for anxiety. 30 tablet 5  . baclofen (LIORESAL) 10 MG tablet Take 1 tablet (10 mg total) by mouth 3 (three) times daily as needed for muscle spasms. 30 tablet 0  . benzonatate (TESSALON PERLES) 100 MG capsule Take 1 capsule (100 mg total) by mouth 3 (three) times daily as  needed for cough. 20 capsule 1  . celecoxib (CELEBREX) 100 MG capsule Take 1 capsule (100 mg total) by mouth 2 (two) times daily as needed. 60 capsule 5  . diphenoxylate-atropine (LOMOTIL) 2.5-0.025 MG tablet Take 1 tablet by mouth as needed for diarrhea or loose stools.    Marland Kitchen estradiol (ESTRACE) 0.5 MG tablet Take 1 tablet (0.5 mg total) by mouth every other day. 45 tablet 2  . fentaNYL (DURAGESIC - DOSED MCG/HR) 50 MCG/HR Place 1 patch (50 mcg total) onto the skin every 3 (three) days. 10 patch 0  . ferrous sulfate 325 (65 FE) MG tablet Take 1 tablet (325 mg total) by mouth 2 (two) times daily with a meal. 60 tablet 3  . Fluticasone-Salmeterol (ADVAIR DISKUS) 250-50 MCG/DOSE AEPB TAKE ONE PUFF TWICE DAILY. 60 each 5  . memantine (NAMENDA) 5 MG tablet Take 5 mg by mouth 2 (two) times daily.    Marland Kitchen NARCAN 4 MG/0.1ML LIQD nasal spray kit Place 0.4 mg into the nose daily as needed (for accidental overdose.).     Marland Kitchen pantoprazole (PROTONIX) 40 MG tablet Take 40 mg by mouth 2 (two) times daily. Take 1 tablet (40 mg) by mouth scheduled every morning, may repeat dose in evening if needed for heartburn/indigestion.    . pregabalin (LYRICA) 300 MG capsule Take 300 mg by mouth 2 (two) times daily.    . silver sulfADIAZINE (SILVADENE) 1 % cream Apply 1 application topically daily as needed (leg infection).     . simvastatin (ZOCOR) 10 MG tablet Take 10 mg by mouth at bedtime.     . Suvorexant (BELSOMRA) 5 MG TABS Take 1 tablet by mouth at bedtime as needed (for insomnia). (Patient taking differently: Take 5 mg by mouth at bedtime. ) 30 tablet 3  . tiotropium (SPIRIVA HANDIHALER) 18 MCG inhalation capsule inhale the contents of  one capsule in the handihaler once daily (Patient taking differently: Place 18 mcg into inhaler and inhale daily. inhale the contents of one capsule in the handihaler once daily) 30 capsule 12  . triamcinolone (KENALOG) 0.025 % ointment Apply 1 application topically 2 (two) times daily. (Patient taking differently: Apply 1 application topically 2 (two) times daily as needed (cracking skin). ) 15 g 0  . VENTOLIN HFA 108 (90 Base) MCG/ACT inhaler Inhale 2 puffs into the lungs every 4 (four) hours as needed for wheezing or shortness of breath. (Patient taking differently: Inhale 2 puffs into the lungs every 4 (four) hours as needed for wheezing or shortness of breath. ) 18 g 5  . vitamin B-12 (CYANOCOBALAMIN) 1000 MCG tablet Take 1 tablet (1,000 mcg total) by mouth daily. 30 tablet 0  . clopidogrel (PLAVIX) 75 MG tablet Take 75 mg by mouth daily.    . traZODone (DESYREL) 50 MG tablet Take 1-2 tablets (50-100 mg total) by mouth at bedtime. (Patient not taking: Reported on 09/16/2018) 30 tablet 5   No facility-administered medications prior to visit.     Allergies  Allergen Reactions  . Percocet [Oxycodone-Acetaminophen] Hives and Rash  . Aspirin Nausea And Vomiting and Other (See Comments)  . Codeine Nausea And Vomiting, Nausea Only and Other (See Comments)  . Propoxyphene Nausea Only and Other (See Comments)  . Sulfa Antibiotics Rash and Other (See Comments)    Review of Systems  Constitutional: Positive for malaise/fatigue.  HENT: Positive for congestion.   Respiratory: Positive for cough, sputum production, shortness of breath and wheezing.        Objective:  Physical Exam  Constitutional: She is oriented to person, place, and time. She appears well-developed.  Frail, sickly appearing woman in wheelchair with productive cough.   Cardiovascular: Normal rate and regular rhythm.  Pulmonary/Chest: Effort normal. She has no wheezes.  Poor air entry bilaterally.   Neurological: She is  alert and oriented to person, place, and time.  Skin: Skin is warm and dry.  Psychiatric: She has a normal mood and affect. Her behavior is normal.    BP 130/70 (BP Location: Right Arm, Patient Position: Sitting, Cuff Size: Normal)   Pulse 86   Temp 98.9 F (37.2 C) (Oral)   Resp 16   Wt 106 lb (48.1 kg)   SpO2 99%   BMI 22.15 kg/m  Wt Readings from Last 3 Encounters:  10/20/18 106 lb (48.1 kg)  09/23/18 106 lb (48.1 kg)  09/04/18 106 lb (48.1 kg)    Health Maintenance Due  Topic Date Due  . MAMMOGRAM  10/30/2017  . PNA vac Low Risk Adult (2 of 2 - PPSV23) 03/13/2018  . INFLUENZA VACCINE  06/25/2018    There are no preventive care reminders to display for this patient.   Lab Results  Component Value Date   TSH 3.449 05/14/2017   Lab Results  Component Value Date   WBC 4.8 09/04/2018   HGB 10.6 (L) 09/04/2018   HCT 34.7 (L) 09/04/2018   MCV 95.1 09/04/2018   PLT 317 09/04/2018   Lab Results  Component Value Date   NA 144 06/20/2018   K 3.3 (L) 06/20/2018   CO2 28 06/20/2018   GLUCOSE 96 06/20/2018   BUN 6 (L) 06/20/2018   CREATININE 0.65 06/22/2018   BILITOT 0.6 06/18/2018   ALKPHOS 178 (H) 06/18/2018   AST 21 06/18/2018   ALT 23 06/18/2018   PROT 6.8 06/18/2018   ALBUMIN 3.5 06/18/2018   CALCIUM 7.7 (L) 06/20/2018   ANIONGAP 9 06/20/2018   Lab Results  Component Value Date   CHOL 138 01/17/2016   Lab Results  Component Value Date   HDL 68 01/17/2016   Lab Results  Component Value Date   LDLCALC 48 01/17/2016   Lab Results  Component Value Date   TRIG 108 01/17/2016   Lab Results  Component Value Date   CHOLHDL 2.0 01/17/2016   Lab Results  Component Value Date   HGBA1C 5.5 04/30/2017       Assessment & Plan:  1. COPD exacerbation (HCC)  Treat as below, counseled on return precautions.   - doxycycline (VIBRA-TABS) 100 MG tablet; Take 1 tablet (100 mg total) by mouth 2 (two) times daily for 7 days.  Dispense: 14 tablet; Refill:  0 - predniSONE (STERAPRED UNI-PAK 21 TAB) 10 MG (21) TBPK tablet; Take 6 pills on day 1, 5 pills on day 2, 4 pills on day 3 until complete.  Dispense: 21 tablet; Refill: 0   Meds ordered this encounter  Medications  . doxycycline (VIBRA-TABS) 100 MG tablet    Sig: Take 1 tablet (100 mg total) by mouth 2 (two) times daily for 7 days.    Dispense:  14 tablet    Refill:  0    Order Specific Question:   Supervising Provider    Answer:   Birdie Sons [[id number]]  . predniSONE (STERAPRED UNI-PAK 21 TAB) 10 MG (21) TBPK tablet    Sig: Take 6 pills on day 1, 5 pills on day 2, 4 pills on day 3 until complete.    Dispense:  21 tablet    Refill:  0    Order Specific Question:   Supervising Provider    Answer:   Birdie Sons [[id number]]   The entirety of the information documented in the History of Present Illness, Review of Systems and Physical Exam were personally obtained by me. Portions of this information were initially documented by Lynford Humphrey, CMA and reviewed by me for thoroughness and accuracy.   Return if symptoms worsen or fail to improve.   Trinna Post, PA-C

## 2018-10-21 ENCOUNTER — Other Ambulatory Visit: Payer: Self-pay | Admitting: Pain Medicine

## 2018-10-23 DIAGNOSIS — G609 Hereditary and idiopathic neuropathy, unspecified: Secondary | ICD-10-CM | POA: Diagnosis not present

## 2018-10-23 DIAGNOSIS — J449 Chronic obstructive pulmonary disease, unspecified: Secondary | ICD-10-CM | POA: Diagnosis not present

## 2018-10-23 DIAGNOSIS — G8929 Other chronic pain: Secondary | ICD-10-CM | POA: Diagnosis not present

## 2018-10-23 DIAGNOSIS — F17210 Nicotine dependence, cigarettes, uncomplicated: Secondary | ICD-10-CM | POA: Diagnosis not present

## 2018-10-23 DIAGNOSIS — I10 Essential (primary) hypertension: Secondary | ICD-10-CM | POA: Diagnosis not present

## 2018-10-23 DIAGNOSIS — I739 Peripheral vascular disease, unspecified: Secondary | ICD-10-CM | POA: Diagnosis not present

## 2018-10-23 DIAGNOSIS — I2510 Atherosclerotic heart disease of native coronary artery without angina pectoris: Secondary | ICD-10-CM | POA: Diagnosis not present

## 2018-10-23 DIAGNOSIS — D509 Iron deficiency anemia, unspecified: Secondary | ICD-10-CM | POA: Diagnosis not present

## 2018-10-23 DIAGNOSIS — M4180 Other forms of scoliosis, site unspecified: Secondary | ICD-10-CM | POA: Diagnosis not present

## 2018-10-24 DIAGNOSIS — J189 Pneumonia, unspecified organism: Secondary | ICD-10-CM | POA: Diagnosis not present

## 2018-10-24 DIAGNOSIS — M6281 Muscle weakness (generalized): Secondary | ICD-10-CM | POA: Diagnosis not present

## 2018-10-24 DIAGNOSIS — R4182 Altered mental status, unspecified: Secondary | ICD-10-CM | POA: Diagnosis not present

## 2018-10-24 DIAGNOSIS — J449 Chronic obstructive pulmonary disease, unspecified: Secondary | ICD-10-CM | POA: Diagnosis not present

## 2018-10-26 DIAGNOSIS — F17210 Nicotine dependence, cigarettes, uncomplicated: Secondary | ICD-10-CM | POA: Diagnosis not present

## 2018-10-26 DIAGNOSIS — D509 Iron deficiency anemia, unspecified: Secondary | ICD-10-CM | POA: Diagnosis not present

## 2018-10-26 DIAGNOSIS — I739 Peripheral vascular disease, unspecified: Secondary | ICD-10-CM | POA: Diagnosis not present

## 2018-10-26 DIAGNOSIS — G8929 Other chronic pain: Secondary | ICD-10-CM | POA: Diagnosis not present

## 2018-10-26 DIAGNOSIS — I2510 Atherosclerotic heart disease of native coronary artery without angina pectoris: Secondary | ICD-10-CM | POA: Diagnosis not present

## 2018-10-26 DIAGNOSIS — M4180 Other forms of scoliosis, site unspecified: Secondary | ICD-10-CM | POA: Diagnosis not present

## 2018-10-26 DIAGNOSIS — G609 Hereditary and idiopathic neuropathy, unspecified: Secondary | ICD-10-CM

## 2018-10-26 DIAGNOSIS — I10 Essential (primary) hypertension: Secondary | ICD-10-CM | POA: Diagnosis not present

## 2018-10-26 DIAGNOSIS — J449 Chronic obstructive pulmonary disease, unspecified: Secondary | ICD-10-CM | POA: Diagnosis not present

## 2018-10-27 ENCOUNTER — Other Ambulatory Visit: Payer: Self-pay | Admitting: Nurse Practitioner

## 2018-10-28 ENCOUNTER — Encounter: Payer: Self-pay | Admitting: Nurse Practitioner

## 2018-10-28 ENCOUNTER — Ambulatory Visit: Payer: Medicare HMO | Attending: Nurse Practitioner | Admitting: Nurse Practitioner

## 2018-10-28 VITALS — BP 105/55 | HR 93 | Temp 97.9°F | Resp 16 | Ht <= 58 in | Wt 106.0 lb

## 2018-10-28 DIAGNOSIS — M48062 Spinal stenosis, lumbar region with neurogenic claudication: Secondary | ICD-10-CM | POA: Insufficient documentation

## 2018-10-28 DIAGNOSIS — E559 Vitamin D deficiency, unspecified: Secondary | ICD-10-CM | POA: Insufficient documentation

## 2018-10-28 DIAGNOSIS — I5031 Acute diastolic (congestive) heart failure: Secondary | ICD-10-CM | POA: Diagnosis not present

## 2018-10-28 DIAGNOSIS — Z882 Allergy status to sulfonamides status: Secondary | ICD-10-CM | POA: Insufficient documentation

## 2018-10-28 DIAGNOSIS — Z886 Allergy status to analgesic agent status: Secondary | ICD-10-CM | POA: Insufficient documentation

## 2018-10-28 DIAGNOSIS — M47817 Spondylosis without myelopathy or radiculopathy, lumbosacral region: Secondary | ICD-10-CM | POA: Insufficient documentation

## 2018-10-28 DIAGNOSIS — F17210 Nicotine dependence, cigarettes, uncomplicated: Secondary | ICD-10-CM | POA: Insufficient documentation

## 2018-10-28 DIAGNOSIS — E876 Hypokalemia: Secondary | ICD-10-CM | POA: Insufficient documentation

## 2018-10-28 DIAGNOSIS — G629 Polyneuropathy, unspecified: Secondary | ICD-10-CM | POA: Insufficient documentation

## 2018-10-28 DIAGNOSIS — I10 Essential (primary) hypertension: Secondary | ICD-10-CM | POA: Diagnosis not present

## 2018-10-28 DIAGNOSIS — Z7902 Long term (current) use of antithrombotics/antiplatelets: Secondary | ICD-10-CM | POA: Diagnosis not present

## 2018-10-28 DIAGNOSIS — M47816 Spondylosis without myelopathy or radiculopathy, lumbar region: Secondary | ICD-10-CM

## 2018-10-28 DIAGNOSIS — D649 Anemia, unspecified: Secondary | ICD-10-CM | POA: Insufficient documentation

## 2018-10-28 DIAGNOSIS — F419 Anxiety disorder, unspecified: Secondary | ICD-10-CM | POA: Insufficient documentation

## 2018-10-28 DIAGNOSIS — M546 Pain in thoracic spine: Secondary | ICD-10-CM

## 2018-10-28 DIAGNOSIS — M533 Sacrococcygeal disorders, not elsewhere classified: Secondary | ICD-10-CM | POA: Diagnosis not present

## 2018-10-28 DIAGNOSIS — I739 Peripheral vascular disease, unspecified: Secondary | ICD-10-CM | POA: Diagnosis not present

## 2018-10-28 DIAGNOSIS — G609 Hereditary and idiopathic neuropathy, unspecified: Secondary | ICD-10-CM | POA: Diagnosis not present

## 2018-10-28 DIAGNOSIS — I110 Hypertensive heart disease with heart failure: Secondary | ICD-10-CM | POA: Diagnosis not present

## 2018-10-28 DIAGNOSIS — M545 Low back pain: Secondary | ICD-10-CM | POA: Diagnosis present

## 2018-10-28 DIAGNOSIS — Z79899 Other long term (current) drug therapy: Secondary | ICD-10-CM | POA: Diagnosis not present

## 2018-10-28 DIAGNOSIS — G8929 Other chronic pain: Secondary | ICD-10-CM

## 2018-10-28 DIAGNOSIS — K219 Gastro-esophageal reflux disease without esophagitis: Secondary | ICD-10-CM | POA: Insufficient documentation

## 2018-10-28 DIAGNOSIS — Z7901 Long term (current) use of anticoagulants: Secondary | ICD-10-CM | POA: Diagnosis not present

## 2018-10-28 DIAGNOSIS — M5386 Other specified dorsopathies, lumbar region: Secondary | ICD-10-CM | POA: Insufficient documentation

## 2018-10-28 DIAGNOSIS — Z79891 Long term (current) use of opiate analgesic: Secondary | ICD-10-CM | POA: Diagnosis not present

## 2018-10-28 DIAGNOSIS — G4700 Insomnia, unspecified: Secondary | ICD-10-CM | POA: Insufficient documentation

## 2018-10-28 DIAGNOSIS — Z8249 Family history of ischemic heart disease and other diseases of the circulatory system: Secondary | ICD-10-CM | POA: Insufficient documentation

## 2018-10-28 DIAGNOSIS — J441 Chronic obstructive pulmonary disease with (acute) exacerbation: Secondary | ICD-10-CM | POA: Insufficient documentation

## 2018-10-28 DIAGNOSIS — Z86718 Personal history of other venous thrombosis and embolism: Secondary | ICD-10-CM | POA: Insufficient documentation

## 2018-10-28 DIAGNOSIS — I2510 Atherosclerotic heart disease of native coronary artery without angina pectoris: Secondary | ICD-10-CM | POA: Insufficient documentation

## 2018-10-28 DIAGNOSIS — D509 Iron deficiency anemia, unspecified: Secondary | ICD-10-CM | POA: Diagnosis not present

## 2018-10-28 DIAGNOSIS — G894 Chronic pain syndrome: Secondary | ICD-10-CM

## 2018-10-28 DIAGNOSIS — M4802 Spinal stenosis, cervical region: Secondary | ICD-10-CM | POA: Insufficient documentation

## 2018-10-28 DIAGNOSIS — M4180 Other forms of scoliosis, site unspecified: Secondary | ICD-10-CM | POA: Diagnosis not present

## 2018-10-28 DIAGNOSIS — G9341 Metabolic encephalopathy: Secondary | ICD-10-CM | POA: Insufficient documentation

## 2018-10-28 DIAGNOSIS — M797 Fibromyalgia: Secondary | ICD-10-CM | POA: Diagnosis not present

## 2018-10-28 DIAGNOSIS — E785 Hyperlipidemia, unspecified: Secondary | ICD-10-CM | POA: Insufficient documentation

## 2018-10-28 DIAGNOSIS — J449 Chronic obstructive pulmonary disease, unspecified: Secondary | ICD-10-CM | POA: Diagnosis not present

## 2018-10-28 DIAGNOSIS — Z885 Allergy status to narcotic agent status: Secondary | ICD-10-CM | POA: Insufficient documentation

## 2018-10-28 MED ORDER — FENTANYL 50 MCG/HR TD PT72: 50.0000 ug | MEDICATED_PATCH | TRANSDERMAL | 0 refills | Status: DC

## 2018-10-28 NOTE — Patient Instructions (Addendum)
Fentanyl patches to last until 01/28/2019 have been escribed to your pharmacy.____________________________________________________________________________________________  Medication Rules  Purpose: To inform patients, and their family members, of our rules and regulations.  Applies to: All patients receiving prescriptions (written or electronic).  Pharmacy of record: Pharmacy where electronic prescriptions will be sent. If written prescriptions are taken to a different pharmacy, please inform the nursing staff. The pharmacy listed in the electronic medical record should be the one where you would like electronic prescriptions to be sent.  Electronic prescriptions: In compliance with the [facility] Strengthen Opioid Misuse Prevention (STOP) Act of 2017 (Session [place]ni ElliotLaw [phone]), effective November 25, 2018, all controlled substances must be electronically prescribed. Calling prescriptions to the pharmacy will cease to exist.  Prescription refills: Only during scheduled appointments. Applies to all prescriptions.  NOTE: The following applies primarily to controlled substances (Opioid* Pain Medications).   Patient's responsibilities: 1. Pain Pills: Bring all pain pills to every appointment (except for procedure appointments). 2. Pill Bottles: Bring pills in original pharmacy bottle. Always bring the newest bottle. Bring bottle, even if empty. 3. Medication refills: You are responsible for knowing and keeping track of what medications you take and those you need refilled. The day before your appointment: write a list of all prescriptions that need to be refilled. The day of the appointment: give the list to the admitting nurse. Prescriptions will be written only during appointments. If you forget a medication: it will not be "Called in", "Faxed", or "electronically sent". You will need to get another appointment to get these prescribed. No early refills. Do not call asking to have your  prescription filled early. 4. Prescription Accuracy: You are responsible for carefully inspecting your prescriptions before leaving our office. Have the discharge nurse carefully go over each prescription with you, before taking them home. Make sure that your name is accurately spelled, that your address is correct. Check the name and dose of your medication to make sure it is accurate. Check the number of pills, and the written instructions to make sure they are clear and accurate. Make sure that you are given enough medication to last until your next medication refill appointment. 5. Taking Medication: Take medication as prescribed. When it comes to controlled substances, taking less pills or less frequently than prescribed is permitted and encouraged. Never take more pills than instructed. Never take medication more frequently than prescribed.  6. Inform other Doctors: Always inform, all of your healthcare providers, of all the medications you take. 7. Pain Medication from other Providers: You are not allowed to accept any additional pain medication from any other Doctor or Healthcare provider. There are two exceptions to this rule. (see below) In the event that you require additional pain medication, you are responsible for notifying us, as stated below. 8. Medication Agreement: You are responsible for carefully reading and following our Medication Agreement. This must be signed before receiving any prescriptions from our practice. Safely store a copy of your signed Agreement. Violations to the Agreement will result in no further prescriptions. (Additional copies of our Medication Agreement are available upon request.) 9. Laws, Rules, & Regulations: All patients are expected to follow all [address]Federal and [place] Disney[place] Laws, [employer] Industries[employer]utes, Rules, [facility] [employer]& Regulations. Ignorance of the Laws does not constitute a valid excuse. The use of any illegal substances is prohibited. 10. Adopted CDC guidelines & recommendations: Target  dosing levels will be at or below 60 MME/day. Use of benzodiazepines** is not recommended.  Exceptions: There are only two exceptions to the  rule of not receiving pain medications from other Healthcare Providers. 1. Exception #1 (Emergencies): In the event of an emergency (i.e.: accident requiring emergency care), you are allowed to receive additional pain medication. However, you are responsible for: As soon as you are able, call our office [phone], at any time of the day or night, and leave a message stating your name, the date and nature of the emergency, and the name and dose of the medication prescribed. In the event that your call is answered by a member of our staff, make sure to document and save the date, time, and the name of the person that took your information.  2. Exception #2 (Planned Surgery): In the event that you are scheduled by another doctor or dentist to have any type of surgery or procedure, you are allowed (for a period no longer than 30 days), to receive additional pain medication, for the acute post-op pain. However, in this case, you are responsible for picking up a copy of our "Post-op Pain Management for Surgeons" handout, and giving it to your surgeon or dentist. This document is available at our office, and does not require an appointment to obtain it. Simply go to our office during business hours (Monday-Thursday from 8:00 AM to 4:00 PM) (Friday 8:00 AM to 12:00 Noon) or if you have a scheduled appointment with [place], prior to your surgery, and ask for it by name. In addition, you will need to provide [place] with your name, name of your surgeon, type of surgery, and date of procedure or surgery.  *Opioid medications include: morphine, codeine, oxycodone, oxymorphone, hydrocodone, hydromorphone, meperidine, tramadol, tapentadol, buprenorphine, fentanyl, methadone. **Benzodiazepine medications include: diazepam (Valium), alprazolam (Xanax), clonazepam (Klonopine), lorazepam  (Ativan), clorazepate (Tranxene), chlordiazepoxide (Librium), estazolam (Prosom), oxazepam (Serax), temazepam (Restoril), triazolam (Halcion) (Last updated: 01/22/2018) ____________________________________________________________________________________________

## 2018-10-28 NOTE — Progress Notes (Signed)
Patient's Name: Cheryl [name]  MRN: [id number]  Referring Provider: Birdie Sons, MD  DOB: 04-Sep-1951  PCP: Birdie Sons, MD  DOS: 10/28/2018  Note by: Vevelyn Francois NP  Service setting: Ambulatory outpatient  Specialty: Interventional Pain Management  Location: [facility] (AMB) Pain Management Facility    Patient type: Established    Primary Reason(s) for Visit: Encounter for prescription drug management. (Level of risk: moderate)  CC: Back Pain (lumbar left is worse )  HPI  Cheryl [name] is a 67 y.o. year old, female patient, who comes today for a medication management evaluation. She has Essential hypertension; GERD (gastroesophageal reflux disease); Hyperlipemia; Anxiety; Airway hyperreactivity; Chronic thoracic back pain; Coronary artery disease; Excessive falling; Alteration in bowel elimination: incontinence; Insomnia; Decreased testosterone level; Leg weakness; Menopausal symptom; Migraine; Neuropathy ([place]); Fecal occult blood test positive; OP (osteoporosis); Panic disorder; Compulsive tobacco user syndrome; Urinary incontinence; Vitamin D deficiency; Weight loss; COPD with acute exacerbation ([place]); Dementia ([place]); Chronic lumbar radicular pain (Bilateral) (L>R) (L5); Chronic low back pain (Secondary Area of Pain) (Bilateral) (L>R); Encounter for therapeutic drug level monitoring; Uncomplicated opioid dependence ([place]); Chronic pain syndrome; Peripheral nerve disease; Peripheral vascular disease ([place]); Platelet inhibition due to Plavix; Abnormal mammogram of right breast; Dilated intrahepatic bile duct; Acute on chronic respiratory failure with hypoxia ([place]); Dysphagia; Acute diastolic CHF (congestive heart failure) ([place]); Leukocytosis; Long term current use of opiate analgesic; Fibromyalgia; Osteoarthrosis; Long term current use of anticoagulant therapy (Plavix); Opiate use (160 MME/Day); Anemia; Lumbar facet syndrome (Bilateral) (L>R); Nausea with vomiting; Protein-calorie malnutrition, severe;  Metabolic encephalopathy; Hypokalemia; Pneumonia; Marijuana use; Rib fracture; Generalized weakness; Hypoxia; Gastritis without bleeding; Acute on chronic respiratory failure ([place]); Iron deficiency anemia; Acute encephalopathy; Goals of care, counseling/discussion; Palliative care by specialist; Depression due to physical illness; Anorexia; Pharmacologic therapy; Spondylosis without myelopathy or radiculopathy, lumbar region; Disorder of skeletal system; Problems influencing health status; DDD (degenerative disc disease), lumbar; Abnormal CT scan, lumbar spine (08/04/2017); Levoscoliosis (Severe); 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, 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; Lumbar Grade 1 Anterolisthesis of L3 over L4 and L4 over L5; Lumbar foraminal stenosis (Bilateral: L3-4, L4-5) (Severe Right L4-5); Lumbar facet arthropathy (Bilateral); Lumbar facet osteoarthritis (Bilateral); Lumbar spondylosis; Cervical spondylosis; Thoracic spondylosis; DDD (degenerative disc disease), thoracic; DDD (degenerative disc disease), cervical; Chronic lower extremity pain (Primary Area of Pain) (Bilateral) (L>R); Chronic hip pain (Fourth Area of Pain) (Bilateral) (L>R); Osteoarthritis of hip (Bilateral); Chronic sacroiliac joint pain (Tertiary Area of Pain) (Bilateral) (L>R); Sacroiliac joint dysfunction (Bilateral); Spondylosis without myelopathy or radiculopathy, lumbosacral region; and Other specified dorsopathies, sacral and sacrococcygeal region on their problem list. Her primarily concern today is the Back Pain (lumbar left is worse )  Pain Assessment: Location: Lower, Left, Right Back Radiating: into both hips  Onset: More than a month ago Duration: Chronic pain Quality: Discomfort, Constant, Throbbing Severity: 3 /10 (subjective, self-reported pain score)  Note: Reported level is compatible with observation.                          Effect on ADL: limiting to her daily activities.  Timing: Constant Modifying factors:  medications, patches, elevating legs  BP: (!) 105/55  HR: 93  Cheryl [name] was last scheduled for an appointment on 10/27/2018 for medication management. During today's appointment we reviewed Cheryl [name]'s chronic pain status, as well as her outpatient medication regimen.  She admits that her pain is stable.  She denies any problems related to her pain.  She denies any side effects of her current medication.  The patient  reports that she does not use drugs. Her body mass index is 22.15 kg/m.  Further details on both, my assessment(s), as well as the proposed treatment plan, please see below.  Controlled Substance Pharmacotherapy Assessment REMS (Risk Evaluation and Mitigation Strategy)  Analgesic: Duragesic 50 mcg/h every 72 hours  MME/day:120 mg/day.  Cheryl Billow, RN  10/28/2018  8:56 AM  Sign at close encounter Nursing Pain Medication Assessment:  Safety precautions to be maintained throughout the outpatient stay will include: orient to surroundings, keep bed in low position, maintain call bell within reach at all times, provide assistance with transfer out of bed and ambulation.  Medication Inspection Compliance: Pill count conducted under aseptic conditions, in front of the patient. Neither the pills nor the bottle was removed from the patient's sight at any time. Once count was completed pills were immediately returned to the patient in their original bottle.  Medication: Fentanyl patch Pill/Patch Count: 1 of 5 pills remain Pill/Patch Appearance: Markings consistent with prescribed medication Bottle Appearance: Standard pharmacy container. Clearly labeled. Filled Date: 1 / 6 / 2019 Last Medication intake:  last patch applied on _0 7          EXPECTED   ng/mg creat    Source of fentanyl is a scheduled prescription medication,    including IV, patch, and transmucosal formulations. Norfentanyl    is an expected metabolite of fentanyl. Drug Present not Declared for Prescription Verification   Oxazepam  31           UNEXPECTED ng/mg creat    Oxazepam may be administered as a scheduled prescription    medication; it is also an expected metabolite of other    benzodiazepine drugs, including diazepam, chlordiazepoxide,    prazepam, clorazepate, halazepam, and temazepam.   Tramadol                       50           UNEXPECTED ng/mg creat   O-Desmethyltramadol            58            UNEXPECTED ng/mg creat    Source of tramadol is a prescription medication.    O-desmethyltramadol is an expected metabolite of tramadol. ==================================================================== Test                      Result    Flag   Units      Ref Range   Creatinine              206              mg/dL      >=20 ==================================================================== Declared Medications:  The flagging and interpretation on this report are based on the  following declared medications.  Unexpected results may arise from  inaccuracies in the declared medications.  **Note: The testing scope of this panel includes these medications:  Alprazolam  Fentanyl  **Note: The testing scope of this panel does not include following  reported medications:  Acetaminophen (Tylenol)  Albuterol  Alendronate  Atropine (Lomotil)  Baclofen  Celecoxib  Clopidogrel  Cyanocobalamin  Diphenoxylate (Lomotil)  Donepezil (Aricept)  Estradiol (Estrace)  Fluticasone (Advair)  Iron (Ferrous Sulfate)  Naloxone (Narcan)  Pantoprazole  Pregabalin (Lyrica)  Salmeterol (Advair)  Simvastatin (Zocor)  Suvorexant  Tiotropium (Spiriva)  Topical (Silvadene)  Triamcinolone (Kenalog) ==================================================================== For clinical consultation, please call [phone]. ====================================================================    UDS interpretation: Compliant         Patient was tapering off the tramadol. Medication Assessment Form: Reviewed. Patient indicates being compliant with therapy Treatment compliance: Compliant Risk Assessment Profile: Aberrant behavior: See prior evaluations. None observed or detected today Comorbid factors increasing risk of overdose: See prior notes. No additional risks detected today Opioid risk tool (ORT) (Total Score):   Personal History of Substance Abuse (SUD-Substance use disorder):  Alcohol:     Illegal Drugs:    Rx Drugs:    ORT Risk Level calculation:   Risk of substance use disorder (SUD): Moderate-to-High  ORT Scoring interpretation table:  Score <3 = Low Risk for SUD  Score between 4-7 = Moderate Risk for SUD  Score >8 = High Risk for Opioid Abuse   Risk Mitigation Strategies:  Patient Counseling: Covered Patient-Prescriber Agreement (PPA): Present and active  Notification to other healthcare providers: Done  Pharmacologic Plan: No change in therapy, at this time.             Laboratory Chemistry  Inflammation Markers (CRP: Acute Phase) (ESR: Chronic Phase) Lab Results  Component Value Date   CRP 0.6 03/01/2016   ESRSEDRATE 52 (H) 03/01/2016   LATICACIDVEN 1.0 06/18/2018                         Rheumatology Markers Lab Results  Component Value Date   LABURIC 2.7 05/21/2018  Renal Function Markers Lab Results  Component Value Date   BUN 6 (L) 06/20/2018   CREATININE 0.65 06/22/2018   BCR 14 04/21/2018   GFRAA >60 06/22/2018   GFRNONAA >60 06/22/2018                             Hepatic Function Markers Lab Results  Component Value Date   AST 21 06/18/2018   ALT 23 06/18/2018   ALBUMIN 3.5 06/18/2018   ALKPHOS 178 (H) 06/18/2018   AMYLASE 30 10/17/2015   LIPASE 22 05/18/2018   AMMONIA <9 (L) 09/12/2017                        Electrolytes Lab Results  Component Value Date   NA 144 06/20/2018   K 3.3 (L) 06/20/2018   CL 107 06/20/2018   CALCIUM 7.7 (L) 06/20/2018   MG 2.0 06/20/2018   PHOS 4.5 04/21/2018                        Neuropathy Markers Lab Results  Component Value Date   VITAMINB12 1,830 (H) 07/24/2018   FOLATE 8.9 06/02/2018   HGBA1C 5.5 04/30/2017   HIV Non Reactive 05/15/2018                        CNS Tests No results found for: COLORCSF, APPEARCSF, RBCCOUNTCSF, WBCCSF, POLYSCSF, LYMPHSCSF, EOSCSF, PROTEINCSF, GLUCCSF, JCVIRUS, CSFOLI, IGGCSF                      Bone Pathology Markers Lab  Results  Component Value Date   VD25OH 25.6 (L) 01/17/2016   VD125OH2TOT 27 07/04/2017   [id number] 24 07/04/2017   [id number] <10 07/04/2017                         Coagulation Parameters Lab Results  Component Value Date   INR 1.03 06/01/2018   LABPROT 13.4 06/01/2018   APTT 38 (H) 10/17/2015   PLT 317 09/04/2018   DDIMER  10/30/2009    0.32        AT THE INHOUSE ESTABLISHED CUTOFF VALUE OF 0.48 ug/mL FEU, THIS ASSAY HAS BEEN DOCUMENTED IN THE LITERATURE TO HAVE A SENSITIVITY AND NEGATIVE PREDICTIVE VALUE OF AT LEAST 98 TO 99%.  THE TEST RESULT SHOULD BE CORRELATED WITH AN ASSESSMENT OF THE CLINICAL PROBABILITY OF DVT / VTE.                        Cardiovascular Markers Lab Results  Component Value Date   BNP 137.0 (H) 05/14/2017   CKTOTAL 65 04/03/2013   CKMB 1.4 04/03/2013   TROPONINI 0.11 (HH) 06/18/2018   HGB 10.6 (L) 09/04/2018   HCT 34.7 (L) 09/04/2018                         CA Markers No results found for: CEA, CA125, LABCA2                      Note: Lab results reviewed.  Recent Diagnostic Imaging Results  CT Cervical Spine Wo Contrast CLINICAL DATA:  Atraumatic headache and neck pain since this morning.  EXAM: CT HEAD WITHOUT CONTRAST  CT CERVICAL SPINE WITHOUT CONTRAST  TECHNIQUE: Multidetector CT imaging of the  head and cervical spine was performed following the standard protocol without intravenous contrast. Multiplanar CT image reconstructions of the cervical spine were also generated.  COMPARISON:  06/18/2018 head CT.  01/26/2013 cervical spine CT  FINDINGS: CT HEAD FINDINGS  Brain: No evidence of parenchymal hemorrhage or extra-axial fluid collection. No mass lesion, mass effect, or midline shift. No CT evidence of acute infarction. Nonspecific mild stable subcortical and periventricular white matter hypodensity, most in keeping with chronic small vessel ischemic change. Cerebral volume is age appropriate. No  ventriculomegaly.  Vascular: No acute abnormality.  Skull: No evidence of calvarial fracture.  Sinuses/Orbits: No fluid levels. Mucoperiosteal thickening in the right greater than left ethmoidal air cells.  Other:  The mastoid air cells are unopacified.  CT CERVICAL SPINE FINDINGS  Alignment: Reversal of the normal cervical lordosis. No facet subluxation. Dens is well positioned between the lateral masses of C1. There is 2 mm anterolisthesis at C3-4, 3 mm anterolisthesis at C4-5, 3 mm retrolisthesis at C5-6 and 2 mm retrolisthesis at C6-7, all worsened since 01/26/2013.  Skull base and vertebrae: No acute fracture. No primary bone lesion or focal pathologic process.  Soft tissues and spinal canal: No prevertebral edema. No visible canal hematoma.  Disc levels: Severe degenerative disc disease at C5-6 and C6-7 with eburnation at these levels, worsened. Mild degenerative disc disease at C4-5. Advanced bilateral facet arthropathy. Mild degenerative foraminal stenosis bilaterally at C5-6 and on the left at C6-7. Mild effacement of the anterior cervical canal at C5-6 by posterior disc osteophytes.  Upper chest: No acute abnormality.  Other: Visualized mastoid air cells appear clear. Hypodense 1.2 cm anterior right thyroid lobe nodule. No pathologically enlarged cervical nodes.  IMPRESSION: CT HEAD:  1.  No evidence of acute intracranial abnormality. 2. Mild chronic small vessel ischemic changes in the cerebral white matter. 3. Mild paranasal sinusitis, chronic appearing.  CT CERVICAL SPINE:  1. Severe degenerative disc disease at C5-6 and C6-7 with eburnation at these levels, worsened. 2. Reversal of the normal cervical lordosis. Multilevel spondylolisthesis in the cervical spine as detailed, worsened. If there is clinical concern for ligamentous instability, recommend cervical spine MRI. 3. Advanced bilateral facet arthropathy. Mild degenerative foraminal stenosis  as detailed.  Electronically Signed   By: Ilona Sorrel M.D.   On: 08/17/2018 21:38 CT Head Wo Contrast CLINICAL DATA:  Atraumatic headache and neck pain since this morning.  EXAM: CT HEAD WITHOUT CONTRAST  CT CERVICAL SPINE WITHOUT CONTRAST  TECHNIQUE: Multidetector CT imaging of the head and cervical spine was performed following the standard protocol without intravenous contrast. Multiplanar CT image reconstructions of the cervical spine were also generated.  COMPARISON:  06/18/2018 head CT.  01/26/2013 cervical spine CT  FINDINGS: CT HEAD FINDINGS  Brain: No evidence of parenchymal hemorrhage or extra-axial fluid collection. No mass lesion, mass effect, or midline shift. No CT evidence of acute infarction. Nonspecific mild stable subcortical and periventricular white matter hypodensity, most in keeping with chronic small vessel ischemic change. Cerebral volume is age appropriate. No ventriculomegaly.  Vascular: No acute abnormality.  Skull: No evidence of calvarial fracture.  Sinuses/Orbits: No fluid levels. Mucoperiosteal thickening in the right greater than left ethmoidal air cells.  Other:  The mastoid air cells are unopacified.  CT CERVICAL SPINE FINDINGS  Alignment: Reversal of the normal cervical lordosis. No facet subluxation. Dens is well positioned between the lateral masses of C1. There is 2 mm anterolisthesis at C3-4, 3 mm anterolisthesis at C4-5, 3 mm retrolisthesis at C5-6  and 2 mm retrolisthesis at C6-7, all worsened since 01/26/2013.  Skull base and vertebrae: No acute fracture. No primary bone lesion or focal pathologic process.  Soft tissues and spinal canal: No prevertebral edema. No visible canal hematoma.  Disc levels: Severe degenerative disc disease at C5-6 and C6-7 with eburnation at these levels, worsened. Mild degenerative disc disease at C4-5. Advanced bilateral facet arthropathy. Mild degenerative foraminal stenosis bilaterally  at C5-6 and on the left at C6-7. Mild effacement of the anterior cervical canal at C5-6 by posterior disc osteophytes.  Upper chest: No acute abnormality.  Other: Visualized mastoid air cells appear clear. Hypodense 1.2 cm anterior right thyroid lobe nodule. No pathologically enlarged cervical nodes.  IMPRESSION: CT HEAD:  1.  No evidence of acute intracranial abnormality. 2. Mild chronic small vessel ischemic changes in the cerebral white matter. 3. Mild paranasal sinusitis, chronic appearing.  CT CERVICAL SPINE:  1. Severe degenerative disc disease at C5-6 and C6-7 with eburnation at these levels, worsened. 2. Reversal of the normal cervical lordosis. Multilevel spondylolisthesis in the cervical spine as detailed, worsened. If there is clinical concern for ligamentous instability, recommend cervical spine MRI. 3. Advanced bilateral facet arthropathy. Mild degenerative foraminal stenosis as detailed.  Electronically Signed   By: Ilona Sorrel M.D.   On: 08/17/2018 21:38  Complexity Note: Imaging results reviewed. Results shared with Ms. Walmer, using Layman's terms.                         Meds   Current Outpatient Medications:  .  acetaminophen (TYLENOL) 325 MG tablet, Take 2 tablets (650 mg total) by mouth every 6 (six) hours as needed for mild pain (or Fever >/= 101)., Disp: , Rfl:  .  albuterol (PROVENTIL) (2.5 MG/3ML) 0.083% nebulizer solution, Take 3 mLs (2.5 mg total) by nebulization every 4 (four) hours as needed for wheezing., Disp: 50 vial, Rfl: 12 .  alendronate (FOSAMAX) 70 MG tablet, Take 1 tablet (70 mg total) by mouth every 7 (seven) days. Take with a full glass of water on an empty stomach. (Patient taking differently: Take 70 mg by mouth every Monday. Take with a full glass of water on an empty stomach.), Disp: 4 tablet, Rfl: 11 .  ALPRAZolam (XANAX) 0.5 MG tablet, Take 0.5-1 tablets (0.25-0.5 mg total) by mouth at bedtime as needed for anxiety., Disp: 30  tablet, Rfl: 5 .  baclofen (LIORESAL) 10 MG tablet, Take 1 tablet (10 mg total) by mouth 3 (three) times daily as needed for muscle spasms., Disp: 30 tablet, Rfl: 0 .  benzonatate (TESSALON PERLES) 100 MG capsule, Take 1 capsule (100 mg total) by mouth 3 (three) times daily as needed for cough., Disp: 20 capsule, Rfl: 1 .  celecoxib (CELEBREX) 100 MG capsule, Take 1 capsule (100 mg total) by mouth 2 (two) times daily as needed., Disp: 60 capsule, Rfl: 5 .  clopidogrel (PLAVIX) 75 MG tablet, Take 75 mg by mouth daily., Disp: , Rfl:  .  diphenoxylate-atropine (LOMOTIL) 2.5-0.025 MG tablet, Take 1 tablet by mouth as needed for diarrhea or loose stools., Disp: , Rfl:  .  estradiol (ESTRACE) 0.5 MG tablet, Take 1 tablet (0.5 mg total) by mouth every other day., Disp: 45 tablet, Rfl: 2 .  [START ON 01/28/2019] fentaNYL (DURAGESIC - DOSED MCG/HR) 50 MCG/HR, Place 1 patch (50 mcg total) onto the skin every 3 (three) days., Disp: 10 patch, Rfl: 0 .  ferrous sulfate 325 (65 FE) MG  tablet, Take 1 tablet (325 mg total) by mouth 2 (two) times daily with a meal., Disp: 60 tablet, Rfl: 3 .  Fluticasone-Salmeterol (ADVAIR DISKUS) 250-50 MCG/DOSE AEPB, TAKE ONE PUFF TWICE DAILY., Disp: 60 each, Rfl: 5 .  memantine (NAMENDA) 5 MG tablet, Take 5 mg by mouth 2 (two) times daily., Disp: , Rfl:  .  NARCAN 4 MG/0.1ML LIQD nasal spray kit, Place 0.4 mg into the nose daily as needed (for accidental overdose.). , Disp: , Rfl:  .  ondansetron (ZOFRAN) 4 MG tablet, Take 4 mg by mouth as needed., Disp: , Rfl:  .  pantoprazole (PROTONIX) 40 MG tablet, Take 40 mg by mouth 2 (two) times daily. Take 1 tablet (40 mg) by mouth scheduled every morning, may repeat dose in evening if needed for heartburn/indigestion., Disp: , Rfl:  .  pregabalin (LYRICA) 300 MG capsule, Take 300 mg by mouth 2 (two) times daily., Disp: , Rfl:  .  silver sulfADIAZINE (SILVADENE) 1 % cream, Apply 1 application topically daily as needed (leg infection). , Disp:  , Rfl:  .  simvastatin (ZOCOR) 10 MG tablet, Take 10 mg by mouth at bedtime. , Disp: , Rfl:  .  Suvorexant (BELSOMRA) 5 MG TABS, Take 1 tablet by mouth at bedtime as needed (for insomnia). (Patient taking differently: Take 5 mg by mouth at bedtime. ), Disp: 30 tablet, Rfl: 3 .  tiotropium (SPIRIVA HANDIHALER) 18 MCG inhalation capsule, inhale the contents of one capsule in the handihaler once daily (Patient taking differently: Place 18 mcg into inhaler and inhale daily. inhale the contents of one capsule in the handihaler once daily), Disp: 30 capsule, Rfl: 12 .  triamcinolone (KENALOG) 0.025 % ointment, Apply 1 application topically 2 (two) times daily. (Patient taking differently: Apply 1 application topically 2 (two) times daily as needed (cracking skin). ), Disp: 15 g, Rfl: 0 .  VENTOLIN HFA 108 (90 Base) MCG/ACT inhaler, Inhale 2 puffs into the lungs every 4 (four) hours as needed for wheezing or shortness of breath. (Patient taking differently: Inhale 2 puffs into the lungs every 4 (four) hours as needed for wheezing or shortness of breath. ), Disp: 18 g, Rfl: 5 .  vitamin B-12 (CYANOCOBALAMIN) 1000 MCG tablet, Take 1 tablet (1,000 mcg total) by mouth daily., Disp: 30 tablet, Rfl: 0 .  [START ON 12/29/2018] fentaNYL (DURAGESIC - DOSED MCG/HR) 50 MCG/HR, Place 1 patch (50 mcg total) onto the skin every 3 (three) days., Disp: 10 patch, Rfl: 0 .  [START ON 11/29/2018] fentaNYL (DURAGESIC - DOSED MCG/HR) 50 MCG/HR, Place 1 patch (50 mcg total) onto the skin every 3 (three) days., Disp: 10 patch, Rfl: 0  ROS  Constitutional: Denies any fever or chills Gastrointestinal: No reported hemesis, hematochezia, vomiting, or acute GI distress Musculoskeletal: Denies any acute onset joint swelling, redness, loss of ROM, or weakness Neurological: No reported episodes of acute onset apraxia, aphasia, dysarthria, agnosia, amnesia, paralysis, loss of coordination, or loss of consciousness  Allergies  Ms. Lastra is  allergic to percocet [oxycodone-acetaminophen]; aspirin; codeine; propoxyphene; and sulfa antibiotics.  [place]  Drug: Ms. Sakata  reports that she does not use drugs. Alcohol:  reports that she does not drink alcohol. Tobacco:  reports that she has been smoking cigarettes. She has a 50.00 pack-year smoking history. She has never used smokeless tobacco. Medical:  has a past medical history of Acute postoperative pain (04/14/2017), Allergy, Anemia, Anxiety, Arthritis, Aspiration pneumonitis ([place]) (11/24/2015), Asthma, CHF (congestive heart failure) ([place]), Chronic pain, COPD (  chronic obstructive pulmonary disease) ([place]), Coronary artery disease, DVT (deep venous thrombosis) ([place]), Dyspnea, Dysrhythmia, Encephalopathy, GERD (gastroesophageal reflux disease), Headache, Hyperlipidemia, Hypertension, Kyphoscoliosis deformity of spine, Migraines, Neuropathy (2010), Osteoporosis, Oxygen deficiency, Peripheral vascular disease ([place]), Pneumonia, Pneumonia (11/19/2015), Pneumonia (10/2016), Pneumonia, Vitamin D deficiency, and Wears dentures. Surgical: Ms. Gardiner  has a past surgical history that includes Appendectomy; Spine surgery; Foot surgery (Bilateral); cardiac catherization (10/31/2009); abdomnal aortic stent (05/30/2008); Abdominal hysterectomy (1975); Cholecystectomy (1972); Cervical fusion (C5 - 6/C6-7); Appendectomy; Colonoscopy with propofol (N/A, 07/27/2015); Esophagogastroduodenoscopy (egd) with propofol (N/A, 07/27/2015); Esophagogastroduodenoscopy (egd) with propofol (N/A, 05/19/2018); Tonsillectomy; Back surgery; Cardiac catheterization; Vascular surgery; Cataract extraction w/PHACO (Left, 08/20/2018); Eye surgery; and Cataract extraction w/PHACO (Right, 09/23/2018). Family: family history includes Arthritis in her mother; Cancer in her brother, brother, and mother; Diabetes in her brother, mother, and sister; Heart disease in her father and mother.  Constitutional Exam  General appearance: Well nourished, well  developed, and well hydrated. In no apparent acute distress Vitals:   10/28/18 0843  BP: (!) 105/55  Pulse: 93  Resp: 16  Temp: 97.9 F (36.6 C)  TempSrc: Oral  SpO2: 94%  Weight: 106 lb (48.1 kg)  Height: 4' 10" (1.473 m)  Psych/Mental status: Alert, oriented x 3 (person, place, & time)       Eyes: PERLA Respiratory: No evidence of acute respiratory distress   Lumbar Spine Area Exam  Skin & Axial Inspection: No masses, redness, or swelling Alignment: Symmetrical Functional ROM: Unrestricted ROM       Stability: No instability detected Muscle Tone/Strength: Functionally intact. No obvious neuro-muscular anomalies detected. Sensory (Neurological): Unimpaired Palpation: No palpable anomalies         Gait & Posture Assessment  Ambulation: Patient ambulates using a wheel chair Gait: Relatively normal for age and body habitus Posture: Poor   Lower Extremity Exam    Side: Right lower extremity  Side: Left lower extremity  Stability: No instability observed          Stability: No instability observed          Skin & Extremity Inspection: Skin color, temperature, and hair growth are WNL. No peripheral edema or cyanosis. No masses, redness, swelling, asymmetry, or associated skin lesions. No contractures.  Skin & Extremity Inspection: Skin color, temperature, and hair growth are WNL. No peripheral edema or cyanosis. No masses, redness, swelling, asymmetry, or associated skin lesions. No contractures.  Functional ROM: Unrestricted ROM                  Functional ROM: Unrestricted ROM                  Muscle Tone/Strength: Functionally intact. No obvious neuro-muscular anomalies detected.  Muscle Tone/Strength: Functionally intact. No obvious neuro-muscular anomalies detected.  Sensory (Neurological): Unimpaired        Sensory (Neurological): Unimpaired        Palpation: No palpable anomalies  Palpation: No palpable anomalies   Assessment  Primary Diagnosis & Pertinent Problem  List: The primary encounter diagnosis was Lumbar facet syndrome (Bilateral) (L>R). Diagnoses of Chronic thoracic back pain, unspecified back pain laterality, Fibromyalgia, and Chronic pain syndrome were also pertinent to this visit.  Status Diagnosis  Controlled Controlled Controlled 1. Lumbar facet syndrome (Bilateral) (L>R)   2. Chronic thoracic back pain, unspecified back pain laterality   3. Fibromyalgia   4. Chronic pain syndrome     Problems updated and reviewed during this visit: No problems updated. Plan of Care  Pharmacotherapy (  Medications Ordered): Meds ordered this encounter  Medications  . fentaNYL (DURAGESIC - DOSED MCG/HR) 50 MCG/HR    Sig: Place 1 patch (50 mcg total) onto the skin every 3 (three) days.    Dispense:  10 patch    Refill:  0    Do not place this medication, or any other prescription from our practice, on "Automatic Refill". Patient may have prescription filled one day early if pharmacy is closed on scheduled refill date.    Order Specific Question:   Supervising Provider    Answer:   Milinda Pointer [phone]  . fentaNYL (DURAGESIC - DOSED MCG/HR) 50 MCG/HR    Sig: Place 1 patch (50 mcg total) onto the skin every 3 (three) days.    Dispense:  10 patch    Refill:  0    Do not place this medication, or any other prescription from our practice, on "Automatic Refill". Patient may have prescription filled one day early if pharmacy is closed on scheduled refill date.    Order Specific Question:   Supervising Provider    Answer:   Milinda Pointer [phone]  . fentaNYL (DURAGESIC - DOSED MCG/HR) 50 MCG/HR    Sig: Place 1 patch (50 mcg total) onto the skin every 3 (three) days.    Dispense:  10 patch    Refill:  0    Do not place this medication, or any other prescription from our practice, on "Automatic Refill". Patient may have prescription filled one day early if pharmacy is closed on scheduled refill date.    Order Specific Question:   Supervising  Provider    Answer:   Milinda Pointer [[id number]]   New Prescriptions   No medications on file   Medications administered today: Lesle Reek had no medications administered during this visit. Lab-work, procedure(s), and/or referral(s): No orders of the defined types were placed in this encounter.  Imaging and/or referral(s): None  Interventional management options: Planned, scheduled, and/or pending:   Palliative left L4-5 LESI under fluoroscopic guidance and IV sedation.   Considering:   Bilateral lumbar facet radiofrequency ablation(Last done: R: 08/12/17; L: 04/14/17)   Palliative PRN treatment(s):   Palliative bilateral lumbar facet block Bilateral lumbar facet radiofrequency ablation    Provider-requested follow-up: Return in about 15 weeks (around 02/10/2019) for MedMgmt.  Future Appointments  Date Time Provider [place]  11/10/2018  8:00 AM Birdie Sons, MD [facility] None  02/10/2019  9:00 AM Vevelyn Francois, NP [facility] None  03/09/2019 11:00 AM [facility] None  03/09/2019 11:30 AM Sindy Guadeloupe, MD [facility] None   Primary Care Physician: Birdie Sons, MD Location: [facility] Note by: Vevelyn Francois NP Date: 10/28/2018; Time: 7:13 PM  Pain Score Disclaimer: We use the NRS-11 scale. This is a self-reported, subjective measurement of pain severity with only modest accuracy. It is used primarily to identify changes within a particular patient. It must be understood that outpatient pain scales are significantly less accurate that those used for research, where they can be applied under ideal controlled circumstances with minimal exposure to variables. In reality, the score is likely to be a combination of pain intensity and pain affect, where pain affect describes the degree of emotional arousal or changes in action readiness caused by the sensory experience of pain. Factors such as social and work  situation, setting, emotional state, anxiety levels, expectation, and prior pain experience may influence pain perception and show large inter-individual differences that  may also be affected by time variables.  Patient instructions provided during this appointment: Patient Instructions  Fentanyl patches to last until 01/28/2019 have been escribed to your pharmacy.____________________________________________________________________________________________  Medication Rules  Purpose: To inform patients, and their family members, of our rules and regulations.  Applies to: All patients receiving prescriptions (written or electronic).  Pharmacy of record: Pharmacy where electronic prescriptions will be sent. If written prescriptions are taken to a different pharmacy, please inform the nursing staff. The pharmacy listed in the electronic medical record should be the one where you would like electronic prescriptions to be sent.  Electronic prescriptions: In compliance with the Goff (STOP) Act of 2017 (Session Lanny Cramp [id number]), effective November 25, 2018, all controlled substances must be electronically prescribed. Calling prescriptions to the pharmacy will cease to exist.  Prescription refills: Only during scheduled appointments. Applies to all prescriptions.  NOTE: The following applies primarily to controlled substances (Opioid* Pain Medications).   Patient's responsibilities: 1. Pain Pills: Bring all pain pills to every appointment (except for procedure appointments). 2. Pill Bottles: Bring pills in original pharmacy bottle. Always bring the newest bottle. Bring bottle, even if empty. 3. Medication refills: You are responsible for knowing and keeping track of what medications you take and those you need refilled. The day before your appointment: write a list of all prescriptions that need to be refilled. The day of the appointment: give the list to  the admitting nurse. Prescriptions will be written only during appointments. If you forget a medication: it will not be "Called in", "Faxed", or "electronically sent". You will need to get another appointment to get these prescribed. No early refills. Do not call asking to have your prescription filled early. 4. Prescription Accuracy: You are responsible for carefully inspecting your prescriptions before leaving our office. Have the discharge nurse carefully go over each prescription with you, before taking them home. Make sure that your name is accurately spelled, that your address is correct. Check the name and dose of your medication to make sure it is accurate. Check the number of pills, and the written instructions to make sure they are clear and accurate. Make sure that you are given enough medication to last until your next medication refill appointment. 5. Taking Medication: Take medication as prescribed. When it comes to controlled substances, taking less pills or less frequently than prescribed is permitted and encouraged. Never take more pills than instructed. Never take medication more frequently than prescribed.  6. Inform other Doctors: Always inform, all of your healthcare providers, of all the medications you take. 7. Pain Medication from other Providers: You are not allowed to accept any additional pain medication from any other Doctor or Healthcare provider. There are two exceptions to this rule. (see below) In the event that you require additional pain medication, you are responsible for notifying us, as stated below. 8. Medication Agreement: You are responsible for carefully reading and following our Medication Agreement. This must be signed before receiving any prescriptions from our practice. Safely store a copy of your signed Agreement. Violations to the Agreement will result in no further prescriptions. (Additional copies of our Medication Agreement are available upon  request.) 9. Laws, Rules, & Regulations: All patients are expected to follow all Federal and [employer], Rules, [employer]. Ignorance of the Laws does not constitute a valid excuse. The use of any illegal substances is prohibited. 10. Adopted CDC guidelines & recommendations: Target dosing levels will be at or below  60 MME/day. Use of benzodiazepines** is not recommended.  Exceptions: There are only two exceptions to the rule of not receiving pain medications from other Healthcare Providers. 1. Exception #1 (Emergencies): In the event of an emergency (i.e.: accident requiring emergency care), you are allowed to receive additional pain medication. However, you are responsible for: As soon as you are able, call our office [phone], at any time of the day or night, and leave a message stating your name, the date and nature of the emergency, and the name and dose of the medication prescribed. In the event that your call is answered by a member of our staff, make sure to document and save the date, time, and the name of the person that took your information.  2. Exception #2 (Planned Surgery): In the event that you are scheduled by another doctor or dentist to have any type of surgery or procedure, you are allowed (for a period no longer than 30 days), to receive additional pain medication, for the acute post-op pain. However, in this case, you are responsible for picking up a copy of our "Post-op Pain Management for Surgeons" handout, and giving it to your surgeon or dentist. This document is available at our office, and does not require an appointment to obtain it. Simply go to our office during business hours (Monday-Thursday from 8:00 AM to 4:00 PM) (Friday 8:00 AM to 12:00 Noon) or if you have a scheduled appointment with [place], prior to your surgery, and ask for it by name. In addition, you will need to provide [place] with your name, name of your surgeon, type of surgery, and date of procedure or  surgery.  *Opioid medications include: morphine, codeine, oxycodone, oxymorphone, hydrocodone, hydromorphone, meperidine, tramadol, tapentadol, buprenorphine, fentanyl, methadone. **Benzodiazepine medications include: diazepam (Valium), alprazolam (Xanax), clonazepam (Klonopine), lorazepam (Ativan), clorazepate (Tranxene), chlordiazepoxide (Librium), estazolam (Prosom), oxazepam (Serax), temazepam (Restoril), triazolam (Halcion) (Last updated: 01/22/2018) ____________________________________________________________________________________________

## 2018-10-28 NOTE — Progress Notes (Signed)
Nursing Pain Medication Assessment:  Safety precautions to be maintained throughout the outpatient stay will include: orient to surroundings, keep bed in low position, maintain call bell within reach at all times, provide assistance with transfer out of bed and ambulation.  Medication Inspection Compliance: Pill count conducted under aseptic conditions, in front of the patient. Neither the pills nor the bottle was removed from the patient's sight at any time. Once count was completed pills were immediately returned to the patient in their original bottle.  Medication: Fentanyl patch Pill/Patch Count: 1 of 5 pills remain Pill/Patch Appearance: Markings consistent with prescribed medication Bottle Appearance: Standard pharmacy container. Clearly labeled. Filled Date: 3511 / 6 / 2019 Last Medication intake:  last patch applied on Sunday

## 2018-10-30 DIAGNOSIS — F17210 Nicotine dependence, cigarettes, uncomplicated: Secondary | ICD-10-CM | POA: Diagnosis not present

## 2018-10-30 DIAGNOSIS — M4180 Other forms of scoliosis, site unspecified: Secondary | ICD-10-CM | POA: Diagnosis not present

## 2018-10-30 DIAGNOSIS — D509 Iron deficiency anemia, unspecified: Secondary | ICD-10-CM | POA: Diagnosis not present

## 2018-10-30 DIAGNOSIS — I2510 Atherosclerotic heart disease of native coronary artery without angina pectoris: Secondary | ICD-10-CM | POA: Diagnosis not present

## 2018-10-30 DIAGNOSIS — I10 Essential (primary) hypertension: Secondary | ICD-10-CM | POA: Diagnosis not present

## 2018-10-30 DIAGNOSIS — I739 Peripheral vascular disease, unspecified: Secondary | ICD-10-CM | POA: Diagnosis not present

## 2018-10-30 DIAGNOSIS — G8929 Other chronic pain: Secondary | ICD-10-CM | POA: Diagnosis not present

## 2018-10-30 DIAGNOSIS — G609 Hereditary and idiopathic neuropathy, unspecified: Secondary | ICD-10-CM | POA: Diagnosis not present

## 2018-10-30 DIAGNOSIS — J449 Chronic obstructive pulmonary disease, unspecified: Secondary | ICD-10-CM | POA: Diagnosis not present

## 2018-11-02 DIAGNOSIS — J449 Chronic obstructive pulmonary disease, unspecified: Secondary | ICD-10-CM | POA: Diagnosis not present

## 2018-11-02 DIAGNOSIS — F17210 Nicotine dependence, cigarettes, uncomplicated: Secondary | ICD-10-CM | POA: Diagnosis not present

## 2018-11-02 DIAGNOSIS — G609 Hereditary and idiopathic neuropathy, unspecified: Secondary | ICD-10-CM | POA: Diagnosis not present

## 2018-11-02 DIAGNOSIS — G8929 Other chronic pain: Secondary | ICD-10-CM | POA: Diagnosis not present

## 2018-11-02 DIAGNOSIS — I10 Essential (primary) hypertension: Secondary | ICD-10-CM | POA: Diagnosis not present

## 2018-11-02 DIAGNOSIS — I2510 Atherosclerotic heart disease of native coronary artery without angina pectoris: Secondary | ICD-10-CM | POA: Diagnosis not present

## 2018-11-02 DIAGNOSIS — M4180 Other forms of scoliosis, site unspecified: Secondary | ICD-10-CM | POA: Diagnosis not present

## 2018-11-02 DIAGNOSIS — D509 Iron deficiency anemia, unspecified: Secondary | ICD-10-CM | POA: Diagnosis not present

## 2018-11-02 DIAGNOSIS — I739 Peripheral vascular disease, unspecified: Secondary | ICD-10-CM | POA: Diagnosis not present

## 2018-11-03 ENCOUNTER — Emergency Department: Payer: Medicare HMO

## 2018-11-03 ENCOUNTER — Encounter: Payer: Self-pay | Admitting: Emergency Medicine

## 2018-11-03 ENCOUNTER — Inpatient Hospital Stay
Admission: EM | Admit: 2018-11-03 | Discharge: 2018-11-08 | DRG: 871 | Disposition: A | Discharge status: Home Health | Payer: Medicare HMO | Attending: Internal Medicine | Admitting: Internal Medicine

## 2018-11-03 ENCOUNTER — Inpatient Hospital Stay
Admit: 2018-11-03 | Discharge: 2018-11-03 | Disposition: A | Discharge status: Home / Self Care | Payer: Medicare HMO | Attending: Pulmonary Disease | Admitting: Pulmonary Disease

## 2018-11-03 ENCOUNTER — Other Ambulatory Visit: Payer: Self-pay

## 2018-11-03 DIAGNOSIS — G9341 Metabolic encephalopathy: Secondary | ICD-10-CM | POA: Diagnosis present

## 2018-11-03 DIAGNOSIS — I2510 Atherosclerotic heart disease of native coronary artery without angina pectoris: Secondary | ICD-10-CM | POA: Diagnosis present

## 2018-11-03 DIAGNOSIS — E440 Moderate protein-calorie malnutrition: Secondary | ICD-10-CM | POA: Diagnosis present

## 2018-11-03 DIAGNOSIS — N179 Acute kidney failure, unspecified: Secondary | ICD-10-CM | POA: Diagnosis not present

## 2018-11-03 DIAGNOSIS — I248 Other forms of acute ischemic heart disease: Secondary | ICD-10-CM | POA: Diagnosis present

## 2018-11-03 DIAGNOSIS — Z79899 Other long term (current) drug therapy: Secondary | ICD-10-CM

## 2018-11-03 DIAGNOSIS — Z90710 Acquired absence of both cervix and uterus: Secondary | ICD-10-CM | POA: Diagnosis not present

## 2018-11-03 DIAGNOSIS — E785 Hyperlipidemia, unspecified: Secondary | ICD-10-CM | POA: Diagnosis present

## 2018-11-03 DIAGNOSIS — I110 Hypertensive heart disease with heart failure: Secondary | ICD-10-CM | POA: Diagnosis present

## 2018-11-03 DIAGNOSIS — J189 Pneumonia, unspecified organism: Secondary | ICD-10-CM

## 2018-11-03 DIAGNOSIS — K219 Gastro-esophageal reflux disease without esophagitis: Secondary | ICD-10-CM | POA: Diagnosis present

## 2018-11-03 DIAGNOSIS — Z79891 Long term (current) use of opiate analgesic: Secondary | ICD-10-CM

## 2018-11-03 DIAGNOSIS — Z9981 Dependence on supplemental oxygen: Secondary | ICD-10-CM

## 2018-11-03 DIAGNOSIS — R0689 Other abnormalities of breathing: Secondary | ICD-10-CM | POA: Diagnosis not present

## 2018-11-03 DIAGNOSIS — J9621 Acute and chronic respiratory failure with hypoxia: Secondary | ICD-10-CM | POA: Diagnosis present

## 2018-11-03 DIAGNOSIS — Z01810 Encounter for preprocedural cardiovascular examination: Secondary | ICD-10-CM | POA: Diagnosis not present

## 2018-11-03 DIAGNOSIS — J449 Chronic obstructive pulmonary disease, unspecified: Secondary | ICD-10-CM | POA: Diagnosis present

## 2018-11-03 DIAGNOSIS — R531 Weakness: Secondary | ICD-10-CM | POA: Diagnosis not present

## 2018-11-03 DIAGNOSIS — R0602 Shortness of breath: Secondary | ICD-10-CM | POA: Diagnosis not present

## 2018-11-03 DIAGNOSIS — R05 Cough: Secondary | ICD-10-CM | POA: Diagnosis not present

## 2018-11-03 DIAGNOSIS — Z86718 Personal history of other venous thrombosis and embolism: Secondary | ICD-10-CM

## 2018-11-03 DIAGNOSIS — A419 Sepsis, unspecified organism: Principal | ICD-10-CM | POA: Diagnosis present

## 2018-11-03 DIAGNOSIS — Z7902 Long term (current) use of antithrombotics/antiplatelets: Secondary | ICD-10-CM | POA: Diagnosis not present

## 2018-11-03 DIAGNOSIS — J9602 Acute respiratory failure with hypercapnia: Secondary | ICD-10-CM | POA: Diagnosis present

## 2018-11-03 DIAGNOSIS — F17210 Nicotine dependence, cigarettes, uncomplicated: Secondary | ICD-10-CM | POA: Diagnosis present

## 2018-11-03 DIAGNOSIS — Z7952 Long term (current) use of systemic steroids: Secondary | ICD-10-CM

## 2018-11-03 DIAGNOSIS — R0902 Hypoxemia: Secondary | ICD-10-CM

## 2018-11-03 DIAGNOSIS — E876 Hypokalemia: Secondary | ICD-10-CM | POA: Diagnosis present

## 2018-11-03 DIAGNOSIS — Z9049 Acquired absence of other specified parts of digestive tract: Secondary | ICD-10-CM

## 2018-11-03 DIAGNOSIS — J9600 Acute respiratory failure, unspecified whether with hypoxia or hypercapnia: Secondary | ICD-10-CM

## 2018-11-03 DIAGNOSIS — I739 Peripheral vascular disease, unspecified: Secondary | ICD-10-CM | POA: Diagnosis present

## 2018-11-03 DIAGNOSIS — Z7951 Long term (current) use of inhaled steroids: Secondary | ICD-10-CM

## 2018-11-03 DIAGNOSIS — Z8249 Family history of ischemic heart disease and other diseases of the circulatory system: Secondary | ICD-10-CM | POA: Diagnosis not present

## 2018-11-03 DIAGNOSIS — I214 Non-ST elevation (NSTEMI) myocardial infarction: Secondary | ICD-10-CM | POA: Diagnosis not present

## 2018-11-03 DIAGNOSIS — J690 Pneumonitis due to inhalation of food and vomit: Secondary | ICD-10-CM | POA: Diagnosis present

## 2018-11-03 DIAGNOSIS — R0603 Acute respiratory distress: Secondary | ICD-10-CM

## 2018-11-03 DIAGNOSIS — G8929 Other chronic pain: Secondary | ICD-10-CM | POA: Diagnosis present

## 2018-11-03 DIAGNOSIS — Z886 Allergy status to analgesic agent status: Secondary | ICD-10-CM

## 2018-11-03 DIAGNOSIS — I5032 Chronic diastolic (congestive) heart failure: Secondary | ICD-10-CM | POA: Diagnosis present

## 2018-11-03 DIAGNOSIS — R509 Fever, unspecified: Secondary | ICD-10-CM

## 2018-11-03 DIAGNOSIS — Z720 Tobacco use: Secondary | ICD-10-CM | POA: Diagnosis not present

## 2018-11-03 DIAGNOSIS — R7989 Other specified abnormal findings of blood chemistry: Secondary | ICD-10-CM

## 2018-11-03 DIAGNOSIS — R6521 Severe sepsis with septic shock: Secondary | ICD-10-CM | POA: Diagnosis present

## 2018-11-03 DIAGNOSIS — R778 Other specified abnormalities of plasma proteins: Secondary | ICD-10-CM

## 2018-11-03 DIAGNOSIS — D638 Anemia in other chronic diseases classified elsewhere: Secondary | ICD-10-CM | POA: Diagnosis present

## 2018-11-03 LAB — URINALYSIS, ROUTINE W REFLEX MICROSCOPIC
Bacteria, UA: NONE SEEN
Bilirubin Urine: NEGATIVE
Bilirubin Urine: NEGATIVE
Glucose, UA: NEGATIVE mg/dL
Glucose, UA: NEGATIVE mg/dL
Hgb urine dipstick: NEGATIVE
Hgb urine dipstick: NEGATIVE
KETONES UR: NEGATIVE mg/dL
Ketones, ur: NEGATIVE mg/dL
Leukocytes, UA: NEGATIVE
Leukocytes, UA: NEGATIVE
Nitrite: NEGATIVE
Nitrite: NEGATIVE
Protein, ur: NEGATIVE mg/dL
Protein, ur: NEGATIVE mg/dL
SPECIFIC GRAVITY, URINE: 1.009 (ref 1.005–1.030)
Specific Gravity, Urine: 1.011 (ref 1.005–1.030)
pH: 7 (ref 5.0–8.0)
pH: 6 (ref 5.0–8.0)

## 2018-11-03 LAB — COMPREHENSIVE METABOLIC PANEL
ALBUMIN: 2.9 g/dL — AB (ref 3.5–5.0)
ALK PHOS: 51 U/L (ref 38–126)
ALT: 9 U/L (ref 0–44)
ALT: 11 U/L (ref 0–44)
AST: 17 U/L (ref 15–41)
AST: 26 U/L (ref 15–41)
Albumin: 3.9 g/dL (ref 3.5–5.0)
Alkaline Phosphatase: 67 U/L (ref 38–126)
Anion gap: 7 (ref 5–15)
Anion gap: 6 (ref 5–15)
BUN: 17 mg/dL (ref 8–23)
BUN: 15 mg/dL (ref 8–23)
CO2: 32 mmol/L (ref 22–32)
CO2: 28 mmol/L (ref 22–32)
Calcium: 9.1 mg/dL (ref 8.9–10.3)
Calcium: 7.6 mg/dL — ABNORMAL LOW (ref 8.9–10.3)
Chloride: 101 mmol/L (ref 98–111)
Chloride: 107 mmol/L (ref 98–111)
Creatinine, Ser: 1.12 mg/dL — ABNORMAL HIGH (ref 0.44–1.00)
Creatinine, Ser: 0.97 mg/dL (ref 0.44–1.00)
GFR calc Af Amer: 58 mL/min — ABNORMAL LOW (ref >60)
GFR calc Af Amer: >60 mL/min (ref >60)
GFR calc non Af Amer: 50 mL/min — ABNORMAL LOW (ref >60)
GFR calc non Af Amer: 60 mL/min — ABNORMAL LOW (ref >60)
GLUCOSE: 88 mg/dL (ref 70–99)
Glucose, Bld: 109 mg/dL — ABNORMAL HIGH (ref 70–99)
POTASSIUM: 4.1 mmol/L (ref 3.5–5.1)
Potassium: 3.5 mmol/L (ref 3.5–5.1)
Sodium: 140 mmol/L (ref 135–145)
Sodium: 141 mmol/L (ref 135–145)
Total Bilirubin: 0.8 mg/dL (ref 0.3–1.2)
Total Bilirubin: 0.6 mg/dL (ref 0.3–1.2)
Total Protein: 7.4 g/dL (ref 6.5–8.1)
Total Protein: 5.4 g/dL — ABNORMAL LOW (ref 6.5–8.1)

## 2018-11-03 LAB — HEMOGLOBIN AND HEMATOCRIT, BLOOD
HCT: 31 % — ABNORMAL LOW (ref 36.0–46.0)
HEMATOCRIT: 33.8 % — AB (ref 36.0–46.0)
Hemoglobin: 10.3 g/dL — ABNORMAL LOW (ref 12.0–15.0)
Hemoglobin: 9.6 g/dL — ABNORMAL LOW (ref 12.0–15.0)

## 2018-11-03 LAB — BLOOD GAS, ARTERIAL
Acid-Base Excess: 5.8 mmol/L — ABNORMAL HIGH (ref 0.0–2.0)
Acid-base deficit: 0.3 mmol/L (ref 0.0–2.0)
Bicarbonate: 32.5 mmol/L — ABNORMAL HIGH (ref 20.0–28.0)
Bicarbonate: 27.7 mmol/L (ref 20.0–28.0)
FIO2: 0.36
FIO2: 0.36
O2 Saturation: 94.2 %
O2 Saturation: 98.4 %
PCO2 ART: 59 mmHg — AB (ref 32.0–48.0)
PH ART: 7.38 (ref 7.350–7.450)
Patient temperature: 37
Patient temperature: 37
pCO2 arterial: 55 mmHg — ABNORMAL HIGH (ref 32.0–48.0)
pH, Arterial: 7.28 — ABNORMAL LOW (ref 7.350–7.450)
pO2, Arterial: 73 mmHg — ABNORMAL LOW (ref 83.0–108.0)
pO2, Arterial: 125 mmHg — ABNORMAL HIGH (ref 83.0–108.0)

## 2018-11-03 LAB — PROTIME-INR
INR: 0.99
INR: 1.15
Prothrombin Time: 13 seconds (ref 11.4–15.2)
Prothrombin Time: 14.6 seconds (ref 11.4–15.2)

## 2018-11-03 LAB — TROPONIN I
Troponin I: 0.18 ng/mL (ref <0.03)
Troponin I: 1.19 ng/mL (ref <0.03)
Troponin I: 0.65 ng/mL (ref <0.03)

## 2018-11-03 LAB — GLUCOSE, CAPILLARY
GLUCOSE-CAPILLARY: 74 mg/dL (ref 70–99)
Glucose-Capillary: 103 mg/dL — ABNORMAL HIGH (ref 70–99)
Glucose-Capillary: 65 mg/dL — ABNORMAL LOW (ref 70–99)
Glucose-Capillary: 67 mg/dL — ABNORMAL LOW (ref 70–99)
Glucose-Capillary: 78 mg/dL (ref 70–99)

## 2018-11-03 LAB — LACTIC ACID, PLASMA
Lactic Acid, Venous: 1.5 mmol/L (ref 0.5–1.9)
Lactic Acid, Venous: 0.8 mmol/L (ref 0.5–1.9)

## 2018-11-03 LAB — CBC
HEMATOCRIT: 33.2 % — AB (ref 36.0–46.0)
Hemoglobin: 10.1 g/dL — ABNORMAL LOW (ref 12.0–15.0)
MCH: 29.9 pg (ref 26.0–34.0)
MCHC: 30.4 g/dL (ref 30.0–36.0)
MCV: 98.2 fL (ref 80.0–100.0)
Platelets: 185 10*3/uL (ref 150–400)
RBC: 3.38 MIL/uL — ABNORMAL LOW (ref 3.87–5.11)
RDW: 14.7 % (ref 11.5–15.5)
WBC: 19.6 10*3/uL — ABNORMAL HIGH (ref 4.0–10.5)
nRBC: 0 % (ref 0.0–0.2)

## 2018-11-03 LAB — STREP PNEUMONIAE URINARY ANTIGEN: Strep Pneumo Urinary Antigen: NEGATIVE

## 2018-11-03 LAB — CBC WITH DIFFERENTIAL/PLATELET
ABS IMMATURE GRANULOCYTES: 0.07 10*3/uL (ref 0.00–0.07)
Basophils Absolute: 0 10*3/uL (ref 0.0–0.1)
Basophils Relative: 0 %
Eosinophils Absolute: 0 10*3/uL (ref 0.0–0.5)
Eosinophils Relative: 0 %
HCT: 40.7 % (ref 36.0–46.0)
Hemoglobin: 12.4 g/dL (ref 12.0–15.0)
Immature Granulocytes: 1 %
Lymphocytes Relative: 4 %
Lymphs Abs: 0.5 10*3/uL — ABNORMAL LOW (ref 0.7–4.0)
MCH: 30 pg (ref 26.0–34.0)
MCHC: 30.5 g/dL (ref 30.0–36.0)
MCV: 98.3 fL (ref 80.0–100.0)
MONO ABS: 0.5 10*3/uL (ref 0.1–1.0)
Monocytes Relative: 4 %
NEUTROS ABS: 12 10*3/uL — AB (ref 1.7–7.7)
Neutrophils Relative %: 91 %
Platelets: 205 10*3/uL (ref 150–400)
RBC: 4.14 MIL/uL (ref 3.87–5.11)
RDW: 14.6 % (ref 11.5–15.5)
WBC: 13.2 10*3/uL — ABNORMAL HIGH (ref 4.0–10.5)
nRBC: 0 % (ref 0.0–0.2)

## 2018-11-03 LAB — ECHOCARDIOGRAM COMPLETE
Height: 58 in
Weight: 1834.23 oz

## 2018-11-03 LAB — TYPE AND SCREEN
ABO/RH(D): A POS
Antibody Screen: NEGATIVE

## 2018-11-03 LAB — CG4 I-STAT (LACTIC ACID): Lactic Acid, Venous: 1.83 mmol/L (ref 0.5–1.9)

## 2018-11-03 LAB — HEPARIN LEVEL (UNFRACTIONATED)
Heparin Unfractionated: 0.21 IU/mL — ABNORMAL LOW (ref 0.30–0.70)
Heparin Unfractionated: 0.24 IU/mL — ABNORMAL LOW (ref 0.30–0.70)

## 2018-11-03 LAB — APTT
aPTT: 33 seconds (ref 24–36)
aPTT: 92 seconds — ABNORMAL HIGH (ref 24–36)

## 2018-11-03 LAB — BRAIN NATRIURETIC PEPTIDE: B Natriuretic Peptide: 58 pg/mL (ref 0.0–100.0)

## 2018-11-03 LAB — EXPECTORATED SPUTUM ASSESSMENT W GRAM STAIN, RFLX TO RESP C: Special Requests: NORMAL

## 2018-11-03 LAB — INFLUENZA PANEL BY PCR (TYPE A & B)
Influenza A By PCR: NEGATIVE
Influenza B By PCR: NEGATIVE

## 2018-11-03 LAB — PROCALCITONIN: Procalcitonin: 10.58 ng/mL

## 2018-11-03 LAB — CORTISOL: Cortisol, Plasma: 13.9 ug/dL

## 2018-11-03 LAB — MRSA PCR SCREENING: MRSA by PCR: NEGATIVE

## 2018-11-03 MED ORDER — DEXTROSE 50 % IV SOLN
INTRAVENOUS | Status: AC
  Administered 2018-11-03: 50 mL
  Filled 2018-11-03: qty 50

## 2018-11-03 MED ORDER — PIPERACILLIN-TAZOBACTAM 3.375 G IVPB 30 MIN
3.3750 g | Freq: Once | INTRAVENOUS | Status: AC
  Administered 2018-11-03: 3.375 g via INTRAVENOUS
  Filled 2018-11-03: qty 50

## 2018-11-03 MED ORDER — SODIUM CHLORIDE 0.9 % IV SOLN
500.0000 mg | INTRAVENOUS | Status: DC
  Filled 2018-11-03: qty 500

## 2018-11-03 MED ORDER — HEPARIN BOLUS VIA INFUSION
700.0000 [IU] | Freq: Once | INTRAVENOUS | Status: AC
  Administered 2018-11-03: 700 [IU] via INTRAVENOUS
  Filled 2018-11-03: qty 700

## 2018-11-03 MED ORDER — HEPARIN (PORCINE) 25000 UT/250ML-% IV SOLN
1050.0000 [IU]/h | INTRAVENOUS | Status: DC
  Administered 2018-11-03: 600 [IU]/h via INTRAVENOUS
  Administered 2018-11-04: 900 [IU]/h via INTRAVENOUS
  Filled 2018-11-03 (×2): qty 250

## 2018-11-03 MED ORDER — HEPARIN SODIUM (PORCINE) 5000 UNIT/ML IJ SOLN
60.0000 [IU]/kg | Freq: Once | INTRAMUSCULAR | Status: AC
  Administered 2018-11-03: 3150 [IU] via INTRAVENOUS

## 2018-11-03 MED ORDER — FENTANYL CITRATE (PF) 100 MCG/2ML IJ SOLN
25.0000 ug | INTRAMUSCULAR | Status: DC | PRN
  Administered 2018-11-03: 25 ug via INTRAVENOUS

## 2018-11-03 MED ORDER — PIPERACILLIN-TAZOBACTAM 3.375 G IVPB
3.3750 g | Freq: Three times a day (TID) | INTRAVENOUS | Status: DC
  Administered 2018-11-03 – 2018-11-05 (×6): 3.375 g via INTRAVENOUS
  Filled 2018-11-03 (×6): qty 50

## 2018-11-03 MED ORDER — SODIUM CHLORIDE 0.9 % IV BOLUS: 1000.0000 mL | Freq: Once | INTRAVENOUS | Status: DC

## 2018-11-03 MED ORDER — MOMETASONE FURO-FORMOTEROL FUM 200-5 MCG/ACT IN AERO
2.0000 | INHALATION_SPRAY | Freq: Two times a day (BID) | RESPIRATORY_TRACT | Status: DC
  Administered 2018-11-04 – 2018-11-08 (×9): 2 via RESPIRATORY_TRACT
  Filled 2018-11-03: qty 8.8

## 2018-11-03 MED ORDER — BENZONATATE 100 MG PO CAPS: 100.0000 mg | ORAL_CAPSULE | Freq: Three times a day (TID) | ORAL | Status: DC | PRN

## 2018-11-03 MED ORDER — PHENYLEPHRINE HCL-NACL 10-0.9 MG/250ML-% IV SOLN
0.0000 ug/min | INTRAVENOUS | Status: DC
  Filled 2018-11-03: qty 250

## 2018-11-03 MED ORDER — ACETAMINOPHEN 650 MG RE SUPP
RECTAL | Status: AC
  Administered 2018-11-03: 13:00:00
  Filled 2018-11-03: qty 2

## 2018-11-03 MED ORDER — SODIUM CHLORIDE 0.9 % IV BOLUS (SEPSIS)
250.0000 mL | Freq: Once | INTRAVENOUS | Status: AC
  Administered 2018-11-03: 250 mL via INTRAVENOUS

## 2018-11-03 MED ORDER — DEXTROSE 5 % IV SOLN
INTRAVENOUS | Status: DC
  Administered 2018-11-04 (×2): via INTRAVENOUS

## 2018-11-03 MED ORDER — BACLOFEN 10 MG PO TABS
10.0000 mg | ORAL_TABLET | Freq: Three times a day (TID) | ORAL | Status: DC | PRN
  Filled 2018-11-03: qty 1

## 2018-11-03 MED ORDER — PANTOPRAZOLE SODIUM 40 MG PO TBEC: 40.0000 mg | DELAYED_RELEASE_TABLET | Freq: Every morning | ORAL | Status: DC

## 2018-11-03 MED ORDER — SODIUM CHLORIDE 0.9 % IV BOLUS (SEPSIS): 1000.0000 mL | Freq: Once | INTRAVENOUS | Status: DC

## 2018-11-03 MED ORDER — SIMVASTATIN 20 MG PO TABS
10.0000 mg | ORAL_TABLET | Freq: Every day | ORAL | Status: DC
  Administered 2018-11-03 – 2018-11-04 (×2): 10 mg via ORAL
  Filled 2018-11-03 (×2): qty 1

## 2018-11-03 MED ORDER — PANTOPRAZOLE SODIUM 40 MG IV SOLR
40.0000 mg | Freq: Two times a day (BID) | INTRAVENOUS | Status: DC
  Administered 2018-11-03 – 2018-11-05 (×5): 40 mg via INTRAVENOUS
  Filled 2018-11-03 (×5): qty 40

## 2018-11-03 MED ORDER — PREGABALIN 75 MG PO CAPS
150.0000 mg | ORAL_CAPSULE | Freq: Two times a day (BID) | ORAL | Status: DC
  Administered 2018-11-03 – 2018-11-08 (×10): 150 mg via ORAL
  Filled 2018-11-03 (×10): qty 2

## 2018-11-03 MED ORDER — SODIUM CHLORIDE 0.9 % IV BOLUS (SEPSIS)
500.0000 mL | Freq: Once | INTRAVENOUS | Status: AC
  Administered 2018-11-03: 500 mL via INTRAVENOUS

## 2018-11-03 MED ORDER — VANCOMYCIN HCL IN DEXTROSE 750-5 MG/150ML-% IV SOLN
750.0000 mg | INTRAVENOUS | Status: DC
  Administered 2018-11-04: 750 mg via INTRAVENOUS
  Filled 2018-11-03 (×2): qty 150

## 2018-11-03 MED ORDER — VANCOMYCIN HCL IN DEXTROSE 1-5 GM/200ML-% IV SOLN
1000.0000 mg | Freq: Once | INTRAVENOUS | Status: AC
  Administered 2018-11-03: 1000 mg via INTRAVENOUS
  Filled 2018-11-03: qty 200

## 2018-11-03 MED ORDER — SODIUM CHLORIDE 0.9 % IV SOLN
2.0000 g | INTRAVENOUS | Status: DC
  Filled 2018-11-03: qty 2

## 2018-11-03 MED ORDER — SODIUM CHLORIDE 0.9 % IV BOLUS (SEPSIS): 500.0000 mL | Freq: Once | INTRAVENOUS | Status: DC

## 2018-11-03 MED ORDER — ACETAMINOPHEN 325 MG PO TABS
650.0000 mg | ORAL_TABLET | Freq: Four times a day (QID) | ORAL | Status: DC | PRN
  Administered 2018-11-04 – 2018-11-05 (×2): 650 mg via ORAL
  Filled 2018-11-03 (×2): qty 2

## 2018-11-03 MED ORDER — ESTRADIOL 1 MG PO TABS
0.5000 mg | ORAL_TABLET | ORAL | Status: DC
  Administered 2018-11-05 – 2018-11-07 (×2): 0.5 mg via ORAL
  Filled 2018-11-03 (×3): qty 0.5

## 2018-11-03 MED ORDER — MEMANTINE HCL 5 MG PO TABS
5.0000 mg | ORAL_TABLET | Freq: Two times a day (BID) | ORAL | Status: DC
  Administered 2018-11-03 – 2018-11-08 (×10): 5 mg via ORAL
  Filled 2018-11-03 (×12): qty 1

## 2018-11-03 MED ORDER — VITAMIN B-12 1000 MCG PO TABS
1000.0000 ug | ORAL_TABLET | Freq: Every day | ORAL | Status: DC
  Administered 2018-11-04 – 2018-11-08 (×5): 1000 ug via ORAL
  Filled 2018-11-03 (×5): qty 1

## 2018-11-03 MED ORDER — FERROUS SULFATE 325 (65 FE) MG PO TABS
325.0000 mg | ORAL_TABLET | Freq: Two times a day (BID) | ORAL | Status: DC
  Administered 2018-11-04 – 2018-11-08 (×9): 325 mg via ORAL
  Filled 2018-11-03 (×9): qty 1

## 2018-11-03 MED ORDER — IPRATROPIUM-ALBUTEROL 0.5-2.5 (3) MG/3ML IN SOLN: 3.0000 mL | RESPIRATORY_TRACT | Status: DC | PRN

## 2018-11-03 MED ORDER — SODIUM CHLORIDE 0.9 % IV BOLUS (SEPSIS): 250.0000 mL | Freq: Once | INTRAVENOUS | Status: DC

## 2018-11-03 MED ORDER — ALBUTEROL SULFATE (2.5 MG/3ML) 0.083% IN NEBU: 2.5000 mg | INHALATION_SOLUTION | RESPIRATORY_TRACT | Status: DC | PRN

## 2018-11-03 MED ORDER — FENTANYL CITRATE (PF) 100 MCG/2ML IJ SOLN
INTRAMUSCULAR | Status: AC
  Administered 2018-11-03: 25 ug via INTRAVENOUS
  Filled 2018-11-03: qty 2

## 2018-11-03 MED ORDER — ACETAMINOPHEN 650 MG RE SUPP
975.0000 mg | Freq: Once | RECTAL | Status: AC
  Administered 2018-11-03: 975 mg via RECTAL

## 2018-11-03 MED ORDER — SODIUM CHLORIDE 0.9 % IV BOLUS (SEPSIS)
1000.0000 mL | Freq: Once | INTRAVENOUS | Status: AC
  Administered 2018-11-03: 1000 mL via INTRAVENOUS

## 2018-11-03 MED ORDER — PANTOPRAZOLE SODIUM 40 MG PO TBEC: 40.0000 mg | DELAYED_RELEASE_TABLET | Freq: Every evening | ORAL | Status: DC | PRN

## 2018-11-03 MED ORDER — SODIUM CHLORIDE 0.9 % IV SOLN
2.0000 g | INTRAVENOUS | Status: DC
  Filled 2018-11-03: qty 20

## 2018-11-03 MED ORDER — SODIUM CHLORIDE 0.9 % IV SOLN
INTRAVENOUS | Status: DC
  Administered 2018-11-03: 12:00:00 via INTRAVENOUS

## 2018-11-03 NOTE — Consult Note (Signed)
Infectious Disease     Reason for Consult Sepsis  Referring Physician: Max Sane Date of Admission:  11/03/2018   Active Problems:   Sepsis [facility])   HPI: Cheryl [name] is a 67 y.o. female with multiple medical problems brought to the emergency room for increasing shortness of breath and possible coffee ground emesis.  She has a history of COPD with home oxygen.  She is having increasing cough and increasing hypoxia at home.  On admission she was experiencing altered mental status and was nonverbal.  Cardiac, hypoxic and febrile white blood count initially 13 and then up to 19.  Temperature of 100.4.  Blood cultures and urine cultures are pending. Chest x-ray showed bibasilar chronic changes and scarring versus a developing infiltrate. She has been started on IV vancomycin and Zosyn and admitted to the intensive care unit. MRSA PCR negative. Flu PCR neg, UA negative  Past Medical History:  Diagnosis Date  . Acute postoperative pain 04/14/2017  . Allergy   . Anemia   . Anxiety   . Arthritis    fingers  . Aspiration pneumonitis ([place]) 11/24/2015  . Asthma   . CHF (congestive heart failure) ([place])   . Chronic pain   . COPD (chronic obstructive pulmonary disease) ([place])   . Coronary artery disease   . DVT (deep venous thrombosis) ([place])   . Dyspnea   . Dysrhythmia   . Encephalopathy    MULTIPLE TIMES  . GERD (gastroesophageal reflux disease)   . Headache   . Hyperlipidemia   . Hypertension   . Kyphoscoliosis deformity of spine   . Migraines   . Neuropathy 2010  . Osteoporosis   . Oxygen deficiency   . Peripheral vascular disease ([place])   . Pneumonia   . Pneumonia 11/19/2015  . Pneumonia 10/2016  . Pneumonia    aspiration 2018- 4 times in last year  . Vitamin D deficiency   . Wears dentures    full upper and lower   Past Surgical History:  Procedure Laterality Date  . ABDOMINAL HYSTERECTOMY  1975   Bilaterl Oophorectomy; [place] to IUD infection  . abdomnal aortic stent   05/30/2008   Dr. Quay Burow  . APPENDECTOMY    . APPENDECTOMY    . BACK SURGERY    . cardiac catherization  10/31/2009  . CARDIAC CATHETERIZATION    . CATARACT EXTRACTION W/PHACO Left 08/20/2018   Procedure: CATARACT EXTRACTION PHACO AND INTRAOCULAR LENS PLACEMENT (IOC);  Surgeon: Eulogio Bear, MD;  Location: [facility] ORS;  Service: Ophthalmology;  Laterality: Left;  [place] 00:45.1 AP% 12.3 CDE 5.54 FLUID PACK LOT # [id number]  . CATARACT EXTRACTION W/PHACO Right 09/23/2018   Procedure: CATARACT EXTRACTION PHACO AND INTRAOCULAR LENS PLACEMENT ([place]);  Surgeon: Eulogio Bear, MD;  Location: [facility] ORS;  Service: Ophthalmology;  Laterality: Right;  [place] 00:45.0 CDE 5.19 Fluid Pack lot # [id number]  . CERVICAL FUSION  C5 - 6/C6-7  . CHOLECYSTECTOMY  1972  . COLONOSCOPY WITH PROPOFOL N/A 07/27/2015   Procedure: COLONOSCOPY WITH PROPOFOL;  Surgeon: Hulen Luster, MD;  Location: [facility] ENDOSCOPY;  Service: Gastroenterology;  Laterality: N/A;  . ESOPHAGOGASTRODUODENOSCOPY (EGD) WITH PROPOFOL N/A 07/27/2015   Procedure: ESOPHAGOGASTRODUODENOSCOPY (EGD) WITH PROPOFOL;  Surgeon: Hulen Luster, MD;  Location: [facility] ENDOSCOPY;  Service: Gastroenterology;  Laterality: N/A;  . ESOPHAGOGASTRODUODENOSCOPY (EGD) WITH PROPOFOL N/A 05/19/2018   Procedure: ESOPHAGOGASTRODUODENOSCOPY (EGD) WITH PROPOFOL;  Surgeon: Lucilla Lame, MD;  Location: [facility] ENDOSCOPY;  Service: Endoscopy;  Laterality: N/A;  . EYE  SURGERY    . FOOT SURGERY Bilateral    5-6 years per patient  . SPINE SURGERY    . TONSILLECTOMY    . VASCULAR SURGERY     LEG STENTS   Social History   Tobacco Use  . Smoking status: Current Every Day Smoker    Packs/day: 1.00    Years: 50.00    Pack years: 50.00    Types: Cigarettes  . Smokeless tobacco: Never Used  . Tobacco comment: Previously smoked 2 ppd  Substance Use Topics  . Alcohol use: No    Alcohol/week: 0.0 standard drinks  . Drug use: No   Family History  Problem Relation Age of Onset  .  Cancer Mother   . Arthritis Mother   . Heart disease Mother   . Diabetes Mother        mellitus, type 2  . Heart disease Father   . Diabetes Sister   . Cancer Brother   . Cancer Brother        lung  . Diabetes Brother     Allergies:  Allergies  Allergen Reactions  . Percocet [Oxycodone-Acetaminophen] Hives and Rash  . Aspirin Nausea And Vomiting and Other (See Comments)  . Codeine Nausea And Vomiting, Nausea Only and Other (See Comments)  . Propoxyphene Nausea Only and Other (See Comments)  . Sulfa Antibiotics Rash and Other (See Comments)    Current antibiotics: Antibiotics Given (last 72 hours)    Date/Time Action Medication Dose Rate   11/03/18 0710 New Bag/Given   piperacillin-tazobactam (ZOSYN) IVPB 3.375 g 3.375 g 100 mL/hr   11/03/18 0713 New Bag/Given   vancomycin (VANCOCIN) IVPB 1000 mg/200 mL premix 1,000 mg 200 mL/hr      MEDICATIONS: . acetaminophen      . estradiol  0.5 mg Oral QODAY  . ferrous sulfate  325 mg Oral BID WC  . memantine  5 mg Oral BID  . mometasone-formoterol  2 puff Inhalation BID  . pantoprazole  40 mg Oral q morning - 10a  . pregabalin  150 mg Oral BID  . simvastatin  10 mg Oral QHS  . vitamin B-12  1,000 mcg Oral Daily    Review of Systems - unable to obtain  OBJECTIVE: Temp:  [98 F (36.7 C)-104.1 F (40.1 C)] 98 F (36.7 C) (12/10 0958) Pulse Rate:  [105-130] 105 (12/10 0959) Resp:  [15-24] 15 (12/10 0959) BP: (79-152)/(38-99) 84/38 (12/10 0959) SpO2:  [83 %-100 %] 100 % (12/10 0959) Weight:  [52 kg-52.6 kg] 52 kg (12/10 0958) Physical Exam  Constitutional:  Very thin, chronically ill appearing, arousable but lethargic, can answer simpel questions HENT: [place]/AT, PERRLA, no scleral icterus Mouth/Throat: Oropharynx is clear and moist. No oropharyngeal exudate.  Cardiovascular: Normal rate, regular rhythm and normal heart sounds. 1/6 sm Pulmonary/Chest: bil rhonchi, poor air movement Neck = supple, no nuchal  rigidity Abdominal: Soft. Bowel sounds are normal.  exhibits no distension. There is no tenderness.  Lymphadenopathy: no cervical adenopathy. No axillary adenopathy Neurological: arousable but lethargic, can answer simpel questions Skin: Skin is warm and dry. No rash noted. No erythema.  Psychiatric: lethargic   LABS: Results for orders placed or performed during the hospital encounter of 11/03/18 (from the past 48 hour(s))  Comprehensive metabolic panel     Status: Abnormal   Collection Time: 11/03/18  6:21 AM  Result Value Ref Range   Sodium 140 135 - 145 mmol/L   Potassium 4.1 3.5 - 5.1 mmol/L   Chloride  101 98 - 111 mmol/L   CO2 32 22 - 32 mmol/L   Glucose, Bld 109 (H) 70 - 99 mg/dL   BUN 17 8 - 23 mg/dL   Creatinine, Ser 1.12 (H) 0.44 - 1.00 mg/dL   Calcium 9.1 8.9 - 10.3 mg/dL   Total Protein 7.4 6.5 - 8.1 g/dL   Albumin 3.9 3.5 - 5.0 g/dL   AST 17 15 - 41 U/L   ALT 9 0 - 44 U/L   Alkaline Phosphatase 67 38 - 126 U/L   Total Bilirubin 0.8 0.3 - 1.2 mg/dL   GFR calc non Af Amer 50 (L) >60 mL/min   GFR calc Af Amer 58 (L) >60 mL/min   Anion gap 7 5 - 15    Comment: Performed at [facility], [address] [place], [place] [address]  CBC WITH DIFFERENTIAL     Status: Abnormal   Collection Time: 11/03/18  6:21 AM  Result Value Ref Range   WBC 13.2 (H) 4.0 - 10.5 K/uL   RBC 4.14 3.87 - 5.11 MIL/uL   Hemoglobin 12.4 12.0 - 15.0 g/dL   HCT 40.7 36.0 - 46.0 %   MCV 98.3 80.0 - 100.0 fL   MCH 30.0 26.0 - 34.0 pg   MCHC 30.5 30.0 - 36.0 g/dL   RDW 14.6 11.5 - 15.5 %   Platelets 205 150 - 400 K/uL   nRBC 0.0 0.0 - 0.2 %   Neutrophils Relative % 91 %   Neutro Abs 12.0 (H) 1.7 - 7.7 K/uL   Lymphocytes Relative 4 %   Lymphs Abs 0.5 (L) 0.7 - 4.0 K/uL   Monocytes Relative 4 %   Monocytes Absolute 0.5 0.1 - 1.0 K/uL   Eosinophils Relative 0 %   Eosinophils Absolute 0.0 0.0 - 0.5 K/uL   Basophils Relative 0 %   Basophils Absolute 0.0 0.0 - 0.1 K/uL   Immature  Granulocytes 1 %   Abs Immature Granulocytes 0.07 0.00 - 0.07 K/uL    Comment: Performed at [facility], [address] [place], [place] [address]  Urinalysis, Routine w reflex microscopic     Status: Abnormal   Collection Time: 11/03/18  6:21 AM  Result Value Ref Range   Color, Urine STRAW (A) YELLOW   APPearance CLEAR (A) CLEAR   Specific Gravity, Urine 1.011 1.005 - 1.030   pH 7.0 5.0 - 8.0   Glucose, UA NEGATIVE NEGATIVE mg/dL   Hgb urine dipstick NEGATIVE NEGATIVE   Bilirubin Urine NEGATIVE NEGATIVE   Ketones, ur NEGATIVE NEGATIVE mg/dL   Protein, ur NEGATIVE NEGATIVE mg/dL   Nitrite NEGATIVE NEGATIVE   Leukocytes, UA NEGATIVE NEGATIVE   RBC / HPF 0-5 0 - 5 RBC/hpf   WBC, UA 0-5 0 - 5 WBC/hpf   Bacteria, UA NONE SEEN NONE SEEN   Squamous Epithelial / LPF 0-5 0 - 5    Comment: Performed at [facility], [address] [place], [place] [address]  Troponin I - ONCE - STAT     Status: Abnormal   Collection Time: 11/03/18  6:21 AM  Result Value Ref Range   Troponin I 0.18 (HH) <0.03 ng/mL    Comment: CRITICAL RESULT CALLED TO, READ BACK BY AND VERIFIED WITH LAURA CATES_0  ON 11/03/18 BY HKP Performed at [facility], [address] [place], [place] [address]   Brain natriuretic peptide     Status: None   Collection Time: 11/03/18  6:21 AM  Result Value  Ref Range   B Natriuretic Peptide 58.0 0.0 - 100.0 pg/mL    Comment: Performed at [facility], [address] [place], [place] [address]  APTT     Status: None   Collection Time: 11/03/18  6:21 AM  Result Value Ref Range   aPTT 33 24 - 36 seconds    Comment: Performed at [facility], [address] [place], [place] [address]  Protime-INR     Status: None   Collection Time: 11/03/18  6:21 AM  Result Value Ref Range   Prothrombin Time 13.0 11.4 - 15.2 seconds   INR 0.99     Comment: Performed at [facility], [place], [place] [address]  CG4  I-STAT (Lactic acid)     Status: None   Collection Time: 11/03/18  6:41 AM  Result Value Ref Range   Lactic Acid, Venous 1.83 0.5 - 1.9 mmol/L  Blood gas, arterial     Status: Abnormal   Collection Time: 11/03/18  6:56 AM  Result Value Ref Range   FIO2 0.36    Delivery systems NASAL CANNULA    pH, Arterial 7.38 7.350 - 7.450   pCO2 arterial 55 (H) 32.0 - 48.0 mmHg   pO2, Arterial 73 (L) 83.0 - 108.0 mmHg   Bicarbonate 32.5 (H) 20.0 - 28.0 mmol/L   Acid-Base Excess 5.8 (H) 0.0 - 2.0 mmol/L   O2 Saturation 94.2 %   Patient temperature 37.0    Collection site RIGHT RADIAL    Sample type ARTERIAL DRAW    Allens test (pass/fail) PASS PASS    Comment: Performed at [facility], [address] [place], [place] [address]  Influenza panel by PCR (type A & B)     Status: None   Collection Time: 11/03/18  7:23 AM  Result Value Ref Range   Influenza A By PCR NEGATIVE NEGATIVE   Influenza B By PCR NEGATIVE NEGATIVE    Comment: (NOTE) The Xpert Xpress Flu assay is intended as an aid in the diagnosis of  influenza and should not be used as a sole basis for treatment.  This  assay is FDA approved for nasopharyngeal swab specimens only. Nasal  washings and aspirates are unacceptable for Xpert Xpress Flu testing. Performed at [facility], [address] [place], [place] [address]   Glucose, capillary     Status: Abnormal   Collection Time: 11/03/18  9:53 AM  Result Value Ref Range   Glucose-Capillary 65 (L) 70 - 99 mg/dL  MRSA PCR Screening     Status: None   Collection Time: 11/03/18  9:57 AM  Result Value Ref Range   MRSA by PCR NEGATIVE NEGATIVE    Comment:        The GeneXpert MRSA Assay (FDA approved for NASAL specimens only), is one component of a comprehensive MRSA colonization surveillance program. It is not intended to diagnose MRSA infection nor to guide or monitor treatment for MRSA infections. Performed at [facility], [address] [place], [place] [address]   Troponin I - Now Then Q6H     Status: Abnormal   Collection Time: 11/03/18 10:15 AM  Result Value Ref Range   Troponin I 1.19 (HH) <0.03 ng/mL    Comment: CRITICAL RESULT CALLED TO, READ BACK BY AND VERIFIED WITH CHRISTINA BAYNES _0  11/03/18 BY FMW Performed at [facility], [address], [place], [place] [address]   CBC     Status: Abnormal  Collection Time: 11/03/18 10:15 AM  Result Value Ref Range   WBC 19.6 (H) 4.0 - 10.5 K/uL   RBC 3.38 (L) 3.87 - 5.11 MIL/uL   Hemoglobin 10.1 (L) 12.0 - 15.0 g/dL   HCT 33.2 (L) 36.0 - 46.0 %   MCV 98.2 80.0 - 100.0 fL   MCH 29.9 26.0 - 34.0 pg   MCHC 30.4 30.0 - 36.0 g/dL   RDW 14.7 11.5 - 15.5 %   Platelets 185 150 - 400 K/uL   nRBC 0.0 0.0 - 0.2 %    Comment: Performed at [facility], [address] [place], [place] [address]  Protime-INR     Status: None   Collection Time: 11/03/18 10:15 AM  Result Value Ref Range   Prothrombin Time 14.6 11.4 - 15.2 seconds   INR 1.15     Comment: Performed at [facility], [address] [place], [place] [address]  APTT     Status: Abnormal   Collection Time: 11/03/18 10:15 AM  Result Value Ref Range   aPTT 92 (H) 24 - 36 seconds    Comment:        IF BASELINE aPTT IS ELEVATED, SUGGEST PATIENT RISK ASSESSMENT BE USED TO DETERMINE APPROPRIATE ANTICOAGULANT THERAPY. Performed at [facility], [address] [place], [place] [address]   Glucose, capillary     Status: Abnormal   Collection Time: 11/03/18 11:14 AM  Result Value Ref Range   Glucose-Capillary 103 (H) 70 - 99 mg/dL   No components found for: ESR, C REACTIVE PROTEIN MICRO: Recent Results (from the past 720 hour(s))  MRSA PCR Screening     Status: None   Collection Time: 11/03/18  9:57 AM  Result Value Ref Range Status   MRSA by PCR NEGATIVE NEGATIVE Final    Comment:        The GeneXpert MRSA Assay (FDA approved for NASAL specimens only), is one  component of a comprehensive MRSA colonization surveillance program. It is not intended to diagnose MRSA infection nor to guide or monitor treatment for MRSA infections. Performed at [facility], [address] [place], [place] [address]     IMAGING: Dg Chest Port 1 View  Result Date: 11/03/2018 CLINICAL DATA:  67 year old female with fever and cough. EXAM: PORTABLE CHEST 1 VIEW COMPARISON:  Chest radiograph dated 06/30/2018 FINDINGS: Bibasilar interstitial densities may represent chronic changes and scarring although recurrent infiltrate is not entirely excluded. Clinical correlation is recommended. There is no pleural effusion or pneumothorax. Stable mild cardiomegaly. No acute osseous pathology. IMPRESSION: Bibasilar chronic changes/scarring versus developing infiltrate. Electronically Signed   By: Anner Crete M.D.   On: 11/03/2018 06:38    Assessment:   Cheryl [name] is a 67 y.o. female with multiple medical problems including COPD on home oxygen admitted with confusion and increasing respiratory distress.  She was febrile to 104.  Currently in the intensive care unit. She has been started on IV vancomycin and Zosyn and admitted to the intensive care unit. MRSA PCR negative. Flu PCR neg, UA negative. [place] pending. CXR with chronic changes but possible developing infiltrate. Currently in the unit O2 by nasal cannula.  Not on pressors. Recommendations Continue vancomycin and would continue Zosyn for possible aspiration and healthcare associated pneumonia. Blood cultures negative tomorrow can discontinue vancomycin as MRSA PCR negative.  Thank you very much for allowing me to participate in the care of this patient. Please call with questions.   Cheral Marker. Ola Spurr, MD

## 2018-11-03 NOTE — Progress Notes (Signed)
1        [employer] [facility] [place] [facility]   PATIENT NAME: Cheryl [name]    MR#:  [id number]  DATE OF BIRTH:  [birth year]  SUBJECTIVE:  CHIEF COMPLAINT:   Chief Complaint  Patient presents with  . Shortness of Breath  Lethargic, hypotensive REVIEW OF SYSTEMS:  ROS unable to provide due to her mental status  DRUG ALLERGIES:   Allergies  Allergen Reactions  . Percocet [Oxycodone-Acetaminophen] Hives and Rash  . Aspirin Nausea And Vomiting and Other (See Comments)  . Codeine Nausea And Vomiting, Nausea Only and Other (See Comments)  . Propoxyphene Nausea Only and Other (See Comments)  . Sulfa Antibiotics Rash and Other (See Comments)   VITALS:  Blood pressure (!) 84/38, pulse (!) 105, temperature 98 F (36.7 C), temperature source Axillary, resp. rate 15, height 4\' 10"  (1.473 m), weight 52 kg, SpO2 100 %. PHYSICAL EXAMINATION:  Physical Exam  Constitutional: She appears lethargic.  HENT:  Head: Normocephalic and atraumatic.  Eyes: Pupils are equal, round, and reactive to light. Conjunctivae and EOM are normal.  Neck: Normal range of motion. Neck supple. No tracheal deviation present. No thyromegaly present.  Cardiovascular: Normal rate, regular rhythm and normal heart sounds.  Pulmonary/Chest: Effort normal. No respiratory distress. She has decreased breath sounds. She has no wheezes. She has rhonchi. She exhibits no tenderness.  Abdominal: Soft. Bowel sounds are normal. She exhibits no distension. There is no tenderness.  Musculoskeletal: Normal range of motion.  Neurological: She appears lethargic. No cranial nerve deficit.  Skin: Skin is warm and dry. No rash noted.   LABORATORY PANEL:  Female CBC Recent Labs  Lab 11/03/18 1015 11/03/18 1420  WBC 19.6*  --   HGB 10.1* 10.3*  HCT 33.2* 33.8*  PLT 185  --    ------------------------------------------------------------------------------------------------------------------ Chemistries  Recent Labs    Lab 11/03/18 1015  NA 141  K 3.5  CL 107  CO2 28  GLUCOSE 88  BUN 15  CREATININE 0.97  CALCIUM 7.6*  AST 26  ALT 11  ALKPHOS 51  BILITOT 0.6   RADIOLOGY:  Dg Chest Port 1 View  Result Date: 11/03/2018 CLINICAL DATA:  67 year old female with fever and cough. EXAM: PORTABLE CHEST 1 VIEW COMPARISON:  Chest radiograph dated 06/30/2018 FINDINGS: Bibasilar interstitial densities may represent chronic changes and scarring although recurrent infiltrate is not entirely excluded. Clinical correlation is recommended. There is no pleural effusion or pneumothorax. Stable mild cardiomegaly. No acute osseous pathology. IMPRESSION: Bibasilar chronic changes/scarring versus developing infiltrate. Electronically Signed   By: Elgie CollardArash  Radparvar M.D.   On: 11/03/2018 06:38   ASSESSMENT AND PLAN:  [age] y f with sepsis  * Sepsis - present on admission due to pna - use sepsis protocol, Step-down -Appreciate ID & PCCM input  * Pneumonia - IV vancomycin and Zosyn - pan c/s  * Elevated troponins - due to supply demand ischemia from sepsis - serial troponins - Cardiology following  * Acute metabolic encephalopathy - likely due to sepsis - hold off Benzo and Narcotics      All the records are reviewed and case discussed with Care Management/Social Worker. Management plans discussed with the patient, nursing and they are in agreement.  CODE STATUS: Full Code  TOTAL TIME TAKING CARE OF THIS PATIENT: 15 minutes.   More than 50% of the time was spent in counseling/coordination of care: YES  POSSIBLE D/C IN 2-3 DAYS, DEPENDING ON CLINICAL CONDITION.   Danaysha Kirn Rosita FireShah M.D  on 11/03/2018 at 5:41 PM  Between 7am to 6pm - Pager - [phone]  After 6pm go to [email or web address] - Social research officer, government  [facility] [place] [facility]  Office  [phone]  CC: Primary care physician; Malva Limes, MD  Note: This dictation was prepared with Dragon dictation along with smaller phrase  technology. Any transcriptional errors that result from this process are unintentional.

## 2018-11-03 NOTE — Consult Note (Signed)
Reason for Consult: Shortness of breath borderline troponins Referring Physician: Mila Merry primary, Dr. Sherryll Burger hospitalist  Cheryl [name] is an 67 y.o. female.  HPI: Patient is a 67 year old white female moderate to severe COPD hypoxemia supplemental oxygen congestive heart failure admitted with sepsis shortness of breath possible pneumonia patient complains of cough congestion while on oxygen coughing up black stuff out of her nose.  Patient arrives with grunting decreased level of consciousness patient has been nonverbal chest x-ray suggested pneumonia temperatures 104.  Patient has been admitted for sepsis troponin was borderline elevated the patient was placed on a heparin drip and cardiology was consulted patient also had an echocardiogram performed  Past Medical History:  Diagnosis Date  . Acute postoperative pain 04/14/2017  . Allergy   . Anemia   . Anxiety   . Arthritis    fingers  . Aspiration pneumonitis (HCC) 11/24/2015  . Asthma   . CHF (congestive heart failure) (HCC)   . Chronic pain   . COPD (chronic obstructive pulmonary disease) (HCC)   . Coronary artery disease   . DVT (deep venous thrombosis) (HCC)   . Dyspnea   . Dysrhythmia   . Encephalopathy    MULTIPLE TIMES  . GERD (gastroesophageal reflux disease)   . Headache   . Hyperlipidemia   . Hypertension   . Kyphoscoliosis deformity of spine   . Migraines   . Neuropathy 2010  . Osteoporosis   . Oxygen deficiency   . Peripheral vascular disease (HCC)   . Pneumonia   . Pneumonia 11/19/2015  . Pneumonia 10/2016  . Pneumonia    aspiration 2018- 4 times in last year  . Vitamin D deficiency   . Wears dentures    full upper and lower    Past Surgical History:  Procedure Laterality Date  . ABDOMINAL HYSTERECTOMY  1975   Bilaterl Oophorectomy; Dur to IUD infection  . abdomnal aortic stent  05/30/2008   Dr. Nanetta Batty  . APPENDECTOMY    . APPENDECTOMY    . BACK SURGERY    . cardiac catherization   10/31/2009  . CARDIAC CATHETERIZATION    . CATARACT EXTRACTION W/PHACO Left 08/20/2018   Procedure: CATARACT EXTRACTION PHACO AND INTRAOCULAR LENS PLACEMENT (IOC);  Surgeon: Nevada Crane, MD;  Location: [facility] ORS;  Service: Ophthalmology;  Laterality: Left;  [place] 00:45.1 AP% 12.3 CDE 5.54 FLUID PACK LOT # [id number]  . CATARACT EXTRACTION W/PHACO Right 09/23/2018   Procedure: CATARACT EXTRACTION PHACO AND INTRAOCULAR LENS PLACEMENT (IOC);  Surgeon: Nevada Crane, MD;  Location: [facility] ORS;  Service: Ophthalmology;  Laterality: Right;  [place] 00:45.0 CDE 5.19 Fluid Pack lot # [id number]  . CERVICAL FUSION  C5 - 6/C6-7  . CHOLECYSTECTOMY  1972  . COLONOSCOPY WITH PROPOFOL N/A 07/27/2015   Procedure: COLONOSCOPY WITH PROPOFOL;  Surgeon: Wallace Cullens, MD;  Location: [facility] ENDOSCOPY;  Service: Gastroenterology;  Laterality: N/A;  . ESOPHAGOGASTRODUODENOSCOPY (EGD) WITH PROPOFOL N/A 07/27/2015   Procedure: ESOPHAGOGASTRODUODENOSCOPY (EGD) WITH PROPOFOL;  Surgeon: Wallace Cullens, MD;  Location: [facility] ENDOSCOPY;  Service: Gastroenterology;  Laterality: N/A;  . ESOPHAGOGASTRODUODENOSCOPY (EGD) WITH PROPOFOL N/A 05/19/2018   Procedure: ESOPHAGOGASTRODUODENOSCOPY (EGD) WITH PROPOFOL;  Surgeon: Midge Minium, MD;  Location: [facility] ENDOSCOPY;  Service: Endoscopy;  Laterality: N/A;  . EYE SURGERY    . FOOT SURGERY Bilateral    5-6 years per patient  . SPINE SURGERY    . TONSILLECTOMY    . VASCULAR SURGERY     LEG STENTS  Family History  Problem Relation Age of Onset  . Cancer Mother   . Arthritis Mother   . Heart disease Mother   . Diabetes Mother        mellitus, type 2  . Heart disease Father   . Diabetes Sister   . Cancer Brother   . Cancer Brother        lung  . Diabetes Brother     Social History:  reports that she has been smoking cigarettes. She has a 50.00 pack-year smoking history. She has never used smokeless tobacco. She reports that she does not drink alcohol or use drugs.  Allergies:   Allergies  Allergen Reactions  . Percocet [Oxycodone-Acetaminophen] Hives and Rash  . Aspirin Nausea And Vomiting and Other (See Comments)  . Codeine Nausea And Vomiting, Nausea Only and Other (See Comments)  . Propoxyphene Nausea Only and Other (See Comments)  . Sulfa Antibiotics Rash and Other (See Comments)    Medications: I have reviewed the patient's current medications.  Results for orders placed or performed during the hospital encounter of 11/03/18 (from the past 48 hour(s))  Comprehensive metabolic panel     Status: Abnormal   Collection Time: 11/03/18  6:21 AM  Result Value Ref Range   Sodium 140 135 - 145 mmol/L   Potassium 4.1 3.5 - 5.1 mmol/L   Chloride 101 98 - 111 mmol/L   CO2 32 22 - 32 mmol/L   Glucose, Bld 109 (H) 70 - 99 mg/dL   BUN 17 8 - 23 mg/dL   Creatinine, Ser 4.09 (H) 0.44 - 1.00 mg/dL   Calcium 9.1 8.9 - 81.1 mg/dL   Total Protein 7.4 6.5 - 8.1 g/dL   Albumin 3.9 3.5 - 5.0 g/dL   AST 17 15 - 41 U/L   ALT 9 0 - 44 U/L   Alkaline Phosphatase 67 38 - 126 U/L   Total Bilirubin 0.8 0.3 - 1.2 mg/dL   GFR calc non Af Amer 50 (L) >60 mL/min   GFR calc Af Amer 58 (L) >60 mL/min   Anion gap 7 5 - 15    Comment: Performed at [facility], [address] [place], [place] [address]  CBC WITH DIFFERENTIAL     Status: Abnormal   Collection Time: 11/03/18  6:21 AM  Result Value Ref Range   WBC 13.2 (H) 4.0 - 10.5 K/uL   RBC 4.14 3.87 - 5.11 MIL/uL   Hemoglobin 12.4 12.0 - 15.0 g/dL   HCT 29.5 62.1 - 30.8 %   MCV 98.3 80.0 - 100.0 fL   MCH 30.0 26.0 - 34.0 pg   MCHC 30.5 30.0 - 36.0 g/dL   RDW 65.7 84.6 - 96.2 %   Platelets 205 150 - 400 K/uL   nRBC 0.0 0.0 - 0.2 %   Neutrophils Relative % 91 %   Neutro Abs 12.0 (H) 1.7 - 7.7 K/uL   Lymphocytes Relative 4 %   Lymphs Abs 0.5 (L) 0.7 - 4.0 K/uL   Monocytes Relative 4 %   Monocytes Absolute 0.5 0.1 - 1.0 K/uL   Eosinophils Relative 0 %   Eosinophils Absolute 0.0 0.0 - 0.5 K/uL   Basophils  Relative 0 %   Basophils Absolute 0.0 0.0 - 0.1 K/uL   Immature Granulocytes 1 %   Abs Immature Granulocytes 0.07 0.00 - 0.07 K/uL    Comment: Performed at [facility], [address] [place], [place] [address]  Urinalysis, Routine w reflex microscopic  Status: Abnormal   Collection Time: 11/03/18  6:21 AM  Result Value Ref Range   Color, Urine STRAW (A) YELLOW   APPearance CLEAR (A) CLEAR   Specific Gravity, Urine 1.011 1.005 - 1.030   pH 7.0 5.0 - 8.0   Glucose, UA NEGATIVE NEGATIVE mg/dL   Hgb urine dipstick NEGATIVE NEGATIVE   Bilirubin Urine NEGATIVE NEGATIVE   Ketones, ur NEGATIVE NEGATIVE mg/dL   Protein, ur NEGATIVE NEGATIVE mg/dL   Nitrite NEGATIVE NEGATIVE   Leukocytes, UA NEGATIVE NEGATIVE   RBC / HPF 0-5 0 - 5 RBC/hpf   WBC, UA 0-5 0 - 5 WBC/hpf   Bacteria, UA NONE SEEN NONE SEEN   Squamous Epithelial / LPF 0-5 0 - 5    Comment: Performed at [facility], [address] [place], [place] [address]  Troponin I - ONCE - STAT     Status: Abnormal   Collection Time: 11/03/18  6:21 AM  Result Value Ref Range   Troponin I 0.18 (HH) <0.03 ng/mL    Comment: CRITICAL RESULT CALLED TO, READ BACK BY AND VERIFIED WITH LAURA CATES@0730  ON 11/03/18 BY HKP Performed at [facility], [address] [place], [place] [address]   Brain natriuretic peptide     Status: None   Collection Time: 11/03/18  6:21 AM  Result Value Ref Range   B Natriuretic Peptide 58.0 0.0 - 100.0 pg/mL    Comment: Performed at [facility], [address] [place], [place] [address]  APTT     Status: None   Collection Time: 11/03/18  6:21 AM  Result Value Ref Range   aPTT 33 24 - 36 seconds    Comment: Performed at [facility], [address] [place], [place] [address]  Protime-INR     Status: None   Collection Time: 11/03/18  6:21 AM  Result Value Ref Range   Prothrombin Time 13.0 11.4 - 15.2 seconds   INR 0.99     Comment: Performed at  [facility], [address] [place], [place] [address]  CG4 I-STAT (Lactic acid)     Status: None   Collection Time: 11/03/18  6:41 AM  Result Value Ref Range   Lactic Acid, Venous 1.83 0.5 - 1.9 mmol/L  Blood gas, arterial     Status: Abnormal   Collection Time: 11/03/18  6:56 AM  Result Value Ref Range   FIO2 0.36    Delivery systems NASAL CANNULA    pH, Arterial 7.38 7.350 - 7.450   pCO2 arterial 55 (H) 32.0 - 48.0 mmHg   pO2, Arterial 73 (L) 83.0 - 108.0 mmHg   Bicarbonate 32.5 (H) 20.0 - 28.0 mmol/L   Acid-Base Excess 5.8 (H) 0.0 - 2.0 mmol/L   O2 Saturation 94.2 %   Patient temperature 37.0    Collection site RIGHT RADIAL    Sample type ARTERIAL DRAW    Allens test (pass/fail) PASS PASS    Comment: Performed at [facility], [address] [place], [place] [address]  Influenza panel by PCR (type A & B)     Status: None   Collection Time: 11/03/18  7:23 AM  Result Value Ref Range   Influenza A By PCR NEGATIVE NEGATIVE   Influenza B By PCR NEGATIVE NEGATIVE    Comment: (NOTE) The Xpert Xpress Flu assay is intended as an aid in the diagnosis of  influenza and should not be used as a sole basis for treatment.  This  assay  is FDA approved for nasopharyngeal swab specimens only. Nasal  washings and aspirates are unacceptable for Xpert Xpress Flu testing. Performed at [facility], [address] [place], [place] [address]   Glucose, capillary     Status: Abnormal   Collection Time: 11/03/18  9:53 AM  Result Value Ref Range   Glucose-Capillary 65 (L) 70 - 99 mg/dL  MRSA PCR Screening     Status: None   Collection Time: 11/03/18  9:57 AM  Result Value Ref Range   MRSA by PCR NEGATIVE NEGATIVE    Comment:        The GeneXpert MRSA Assay (FDA approved for NASAL specimens only), is one component of a comprehensive MRSA colonization surveillance program. It is not intended to diagnose MRSA infection nor to guide or monitor treatment  for MRSA infections. Performed at [facility], [address] [place], [place] [address]   Troponin I - Now Then Q6H     Status: Abnormal   Collection Time: 11/03/18 10:15 AM  Result Value Ref Range   Troponin I 1.19 (HH) <0.03 ng/mL    Comment: CRITICAL RESULT CALLED TO, READ BACK BY AND VERIFIED WITH CHRISTINA BAYNES @1051  11/03/18 BY FMW Performed at [facility], [address] [place], [place] [address]   CBC     Status: Abnormal   Collection Time: 11/03/18 10:15 AM  Result Value Ref Range   WBC 19.6 (H) 4.0 - 10.5 K/uL   RBC 3.38 (L) 3.87 - 5.11 MIL/uL   Hemoglobin 10.1 (L) 12.0 - 15.0 g/dL   HCT 91.4 (L) 78.2 - 95.6 %   MCV 98.2 80.0 - 100.0 fL   MCH 29.9 26.0 - 34.0 pg   MCHC 30.4 30.0 - 36.0 g/dL   RDW 21.3 08.6 - 57.8 %   Platelets 185 150 - 400 K/uL   nRBC 0.0 0.0 - 0.2 %    Comment: Performed at [facility], [address] [place], [place] [address]  Comprehensive metabolic panel     Status: Abnormal   Collection Time: 11/03/18 10:15 AM  Result Value Ref Range   Sodium 141 135 - 145 mmol/L   Potassium 3.5 3.5 - 5.1 mmol/L   Chloride 107 98 - 111 mmol/L   CO2 28 22 - 32 mmol/L   Glucose, Bld 88 70 - 99 mg/dL   BUN 15 8 - 23 mg/dL   Creatinine, Ser 9.52 0.44 - 1.00 mg/dL   Calcium 7.6 (L) 8.9 - 10.3 mg/dL   Total Protein 5.4 (L) 6.5 - 8.1 g/dL   Albumin 2.9 (L) 3.5 - 5.0 g/dL   AST 26 15 - 41 U/L   ALT 11 0 - 44 U/L   Alkaline Phosphatase 51 38 - 126 U/L   Total Bilirubin 0.6 0.3 - 1.2 mg/dL   GFR calc non Af Amer 60 (L) >60 mL/min   GFR calc Af Amer >60 >60 mL/min   Anion gap 6 5 - 15    Comment: Performed at [facility], [address] [place], [place] [address]  Protime-INR     Status: None   Collection Time: 11/03/18 10:15 AM  Result Value Ref Range   Prothrombin Time 14.6 11.4 - 15.2 seconds   INR 1.15     Comment: Performed at [facility], [address] [place], [place] [address]   Procalcitonin     Status: None   Collection Time: 11/03/18 10:15 AM  Result Value Ref Range  Procalcitonin 10.58 ng/mL    Comment:        Interpretation: PCT >= 10 ng/mL: Important systemic inflammatory response, almost exclusively due to severe bacterial sepsis or septic shock. (NOTE)       Sepsis PCT Algorithm           Lower Respiratory Tract                                      Infection PCT Algorithm    ----------------------------     ----------------------------         PCT < 0.25 ng/mL                PCT < 0.10 ng/mL         Strongly encourage             Strongly discourage   discontinuation of antibiotics    initiation of antibiotics    ----------------------------     -----------------------------       PCT 0.25 - 0.50 ng/mL            PCT 0.10 - 0.25 ng/mL               OR       >80% decrease in PCT            Discourage initiation of                                            antibiotics      Encourage discontinuation           of antibiotics    ----------------------------     -----------------------------         PCT >= 0.50 ng/mL              PCT 0.26 - 0.50 ng/mL                AND       <80% decrease in PCT             Encourage initiation of                                             antibiotics       Encourage continuation           of antibiotics    ----------------------------     -----------------------------        PCT >= 0.50 ng/mL                  PCT > 0.50 ng/mL               AND         increase in PCT                  Strongly encourage                                      initiation of antibiotics    Strongly encourage escalation           of antibiotics                                     -----------------------------  PCT <= 0.25 ng/mL                                                 OR                                        > 80% decrease in PCT                                     Discontinue / Do  not initiate                                             antibiotics Performed at [facility], [address] [place], [place] [address]   APTT     Status: Abnormal   Collection Time: 11/03/18 10:15 AM  Result Value Ref Range   aPTT 92 (H) 24 - 36 seconds    Comment:        IF BASELINE aPTT IS ELEVATED, SUGGEST PATIENT RISK ASSESSMENT BE USED TO DETERMINE APPROPRIATE ANTICOAGULANT THERAPY. Performed at [facility], [address] [place], [place] [address]   Glucose, capillary     Status: Abnormal   Collection Time: 11/03/18 11:14 AM  Result Value Ref Range   Glucose-Capillary 103 (H) 70 - 99 mg/dL  Lactic acid, plasma     Status: None   Collection Time: 11/03/18 11:17 AM  Result Value Ref Range   Lactic Acid, Venous 1.5 0.5 - 1.9 mmol/L    Comment: Performed at [facility], [address] [place], [place] [address]  Urinalysis, Routine w reflex microscopic     Status: Abnormal   Collection Time: 11/03/18 11:39 AM  Result Value Ref Range   Color, Urine STRAW (A) YELLOW   APPearance CLEAR (A) CLEAR   Specific Gravity, Urine 1.009 1.005 - 1.030   pH 6.0 5.0 - 8.0   Glucose, UA NEGATIVE NEGATIVE mg/dL   Hgb urine dipstick NEGATIVE NEGATIVE   Bilirubin Urine NEGATIVE NEGATIVE   Ketones, ur NEGATIVE NEGATIVE mg/dL   Protein, ur NEGATIVE NEGATIVE mg/dL   Nitrite NEGATIVE NEGATIVE   Leukocytes, UA NEGATIVE NEGATIVE    Comment: Performed at [facility], [address] [place], [place] [address]    Dg Chest Port 1 View  Result Date: 11/03/2018 CLINICAL DATA:  67 year old female with fever and cough. EXAM: PORTABLE CHEST 1 VIEW COMPARISON:  Chest radiograph dated 06/30/2018 FINDINGS: Bibasilar interstitial densities may represent chronic changes and scarring although recurrent infiltrate is not entirely excluded. Clinical correlation is recommended. There is no pleural effusion or pneumothorax. Stable mild cardiomegaly. No acute  osseous pathology. IMPRESSION: Bibasilar chronic changes/scarring versus developing infiltrate. Electronically Signed   By: Elgie Collard M.D.   On: 11/03/2018 06:38    Review of Systems  Constitutional: Positive for diaphoresis, fever, malaise/fatigue and weight loss.  HENT: Positive for congestion.   Eyes: Negative.   Respiratory: Positive for shortness of breath.   Cardiovascular: Positive for claudication.  Gastrointestinal: Negative.   Genitourinary: Negative.   Musculoskeletal: Negative.   Skin:  Negative.   Neurological: Positive for loss of consciousness and weakness.  Endo/Heme/Allergies: Negative.   Psychiatric/Behavioral: Negative.    Blood pressure (!) 84/38, pulse (!) 105, temperature 98 F (36.7 C), temperature source Axillary, resp. rate 15, height 4\' 10"  (1.473 m), weight 52 kg, SpO2 100 %. Physical Exam  Nursing note and vitals reviewed. Constitutional: She appears well-developed and well-nourished. She appears lethargic.  HENT:  Head: Normocephalic and atraumatic.  Eyes: Pupils are equal, round, and reactive to light. Conjunctivae and EOM are normal.  Neck: Normal range of motion. Neck supple.  Cardiovascular: Normal rate, regular rhythm and normal heart sounds.  Respiratory: Effort normal and breath sounds normal.  GI: Soft. Bowel sounds are normal.  Musculoskeletal: Normal range of motion.  Neurological: She appears lethargic. A cranial nerve deficit is present. She exhibits abnormal muscle tone. Coordination abnormal.  Skin: Skin is warm and dry.  Psychiatric: She has a normal mood and affect.    Assessment/Plan: Shortness of breath Elevated troponin Possible pneumonia Sepsis Abnormal EKG Hypotension Left leg pain COPD Congestive heart failure Hypoxemia Smoking Altered mental status Demand ischemia . Plan Agree with admit to ICU Recommend anticoagulation with IV heparin Agree with echocardiogram for further assessment evaluation Adviced  patient refrain from tobacco abuse Elevated troponins more suggestive of demand ischemia Agree with broad-spectrum antibiotic therapy for sepsis possible pneumonia Continue inhalers to help with COPD Consider vascular evaluation for leg discomfort possible claudication Supplemental oxygen therapy  Beyonce Sawatzky D [place] 11/03/2018, 1:09 PM

## 2018-11-03 NOTE — Progress Notes (Signed)
[facility]  Patient Status: Active  [facility] is providing the following services: SN, HHA  If patient discharges after hours, please call [phone].   Cheryl GerlachJason E Hinton 11/03/2018, 9:06 AM

## 2018-11-03 NOTE — ED Provider Notes (Addendum)
[facility] Emergency Department Provider Note   ____________________________________________   First MD Initiated Contact with Patient 11/03/18 [id number]     (approximate)  I have reviewed the triage vital signs and the nursing notes.   HISTORY  Chief Complaint Shortness of Breath  Level V caveat: Limited by altered mentation  HPI Cheryl [name] is a 67 y.o. female brought to the ED from home by her family with a chief complaint of shortness of breath.  Patient has a history of COPD with oxygen to use at home as needed.  Over the past week, has been noted patient with congested sounding cough and using oxygen more than usual.  States "black stuff" is coming out of her nose and when she coughs.  Patient arrives grunting, decreased LOC and nonverbal which is not her baseline.   Past Medical History:  Diagnosis Date  . Acute postoperative pain 04/14/2017  . Allergy   . Anemia   . Anxiety   . Arthritis    fingers  . Aspiration pneumonitis ([place]) 11/24/2015  . Asthma   . CHF (congestive heart failure) ([place])   . Chronic pain   . COPD (chronic obstructive pulmonary disease) ([place])   . Coronary artery disease   . DVT (deep venous thrombosis) ([place])   . Dyspnea   . Dysrhythmia   . Encephalopathy    MULTIPLE TIMES  . GERD (gastroesophageal reflux disease)   . Headache   . Hyperlipidemia   . Hypertension   . Kyphoscoliosis deformity of spine   . Migraines   . Neuropathy 2010  . Osteoporosis   . Oxygen deficiency   . Peripheral vascular disease ([place])   . Pneumonia   . Pneumonia 11/19/2015  . Pneumonia 10/2016  . Pneumonia    aspiration 2018- 4 times in last year  . Vitamin D deficiency   . Wears dentures    full upper and lower    Patient Active Problem List   Diagnosis Date Noted  . Chronic lower extremity pain (Primary Area of Pain) (Bilateral) (L>R) 09/03/2018  . Chronic hip pain (Fourth Area of Pain) (Bilateral) (L>R) 09/03/2018  .  Osteoarthritis of hip (Bilateral) 09/03/2018  . Chronic sacroiliac joint pain [facility] Area of Pain) (Bilateral) (L>R) 09/03/2018  . Sacroiliac joint dysfunction (Bilateral) 09/03/2018  . Spondylosis without myelopathy or radiculopathy, lumbosacral region 09/03/2018  . Other specified dorsopathies, sacral and sacrococcygeal region 09/03/2018  . Disorder of skeletal system 09/02/2018  . Problems influencing health status 09/02/2018  . DDD (degenerative disc disease), lumbar 09/02/2018  . Abnormal CT scan, lumbar spine (08/04/2017) 09/02/2018  . Levoscoliosis (Severe) 09/02/2018  . 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 Lumbar Grade 1 Anterolisthesis of L3 over L4 and L4 over L5 09/02/2018  . Lumbar foraminal stenosis (Bilateral: L3-4, L4-5) (Severe Right L4-5) 09/02/2018  . Lumbar facet arthropathy (Bilateral) 09/02/2018  . Lumbar facet osteoarthritis (Bilateral) 09/02/2018  . Lumbar spondylosis 09/02/2018  . Cervical spondylosis 09/02/2018  . Thoracic spondylosis 09/02/2018  . DDD (degenerative disc disease), thoracic 09/02/2018  . DDD (degenerative disc disease), cervical 09/02/2018  . Pharmacologic therapy 09/01/2018  . Spondylosis without myelopathy or radiculopathy, lumbar region 09/01/2018  . Anorexia 07/07/2018  . 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 Goals of care, counseling/discussion   . Palliative care by specialist   . Acute encephalopathy 06/18/2018  . Iron deficiency anemia 06/09/2018  . Acute on chronic respiratory failure ([place]) 06/01/2018  . Gastritis  without bleeding   . Hypoxia 05/14/2018  . Generalized weakness 02/08/2018  . Rib fracture 09/19/2017  . Marijuana use 06/05/2017  . Pneumonia 04/29/2017  . Metabolic encephalopathy 34/74/2595  . Hypokalemia 12/23/2016  . Protein-calorie malnutrition, severe 11/06/2016  . Nausea with vomiting   . Lumbar facet syndrome (Bilateral) (L>R) 03/20/2016  . Opiate  use (160 MME/Day) 02/28/2016  . Anemia 02/28/2016  . Osteoarthrosis 12/18/2015  . Long term current use of anticoagulant therapy (Plavix) 12/18/2015  . Long term current use of opiate analgesic 12/04/2015  . Fibromyalgia 12/04/2015  . Acute on chronic respiratory failure with hypoxia ([place]) 11/24/2015  . Dysphagia 11/24/2015  . Acute diastolic CHF (congestive heart failure) ([place]) 11/24/2015  . Leukocytosis 11/24/2015  . Abnormal mammogram of right breast 10/11/2015  . Dilated intrahepatic bile duct 10/11/2015  . Chronic lumbar radicular pain (Bilateral) (L>R) (L5) 09/04/2015  . Chronic low back pain (Secondary Area of Pain) (Bilateral) (L>R) 09/04/2015  . Encounter for therapeutic drug level monitoring 09/04/2015  . Uncomplicated opioid dependence ([place]) 09/04/2015  . Chronic pain syndrome 09/04/2015  . Platelet inhibition due to Plavix 09/04/2015  . COPD with acute exacerbation ([place]) 07/31/2015  . Dementia ([place]) 07/31/2015  . Airway hyperreactivity 07/03/2015  . Chronic thoracic back pain 07/03/2015  . Excessive falling 07/03/2015  . Alteration in bowel elimination: incontinence 07/03/2015  . Insomnia 07/03/2015  . Decreased testosterone level 07/03/2015  . Leg weakness 07/03/2015  . Menopausal symptom 07/03/2015  . Migraine 07/03/2015  . Neuropathy ([place]) 07/03/2015  . Fecal occult blood test positive 07/03/2015  . OP (osteoporosis) 07/03/2015  . Panic disorder 07/03/2015  . Compulsive tobacco user syndrome 07/03/2015  . Urinary incontinence 07/03/2015  . Weight loss 07/03/2015  . Essential hypertension 06/06/2015  . GERD (gastroesophageal reflux disease) 06/06/2015  . Hyperlipemia 06/06/2015  . Peripheral nerve disease 04/13/2014  . Anxiety 02/10/2014  . Coronary artery disease 02/10/2014  . Peripheral vascular disease ([place]) 02/10/2014  . Vitamin D deficiency 10/16/2009    Past Surgical History:  Procedure Laterality Date  . ABDOMINAL HYSTERECTOMY  1975   Bilaterl  Oophorectomy; [place] to IUD infection  . abdomnal aortic stent  05/30/2008   Dr. Quay Burow  . APPENDECTOMY    . APPENDECTOMY    . BACK SURGERY    . cardiac catherization  10/31/2009  . CARDIAC CATHETERIZATION    . CATARACT EXTRACTION W/PHACO Left 08/20/2018   Procedure: CATARACT EXTRACTION PHACO AND INTRAOCULAR LENS PLACEMENT (IOC);  Surgeon: Eulogio Bear, MD;  Location: [facility] ORS;  Service: Ophthalmology;  Laterality: Left;  [place] 00:45.1 AP% 12.3 CDE 5.54 FLUID PACK LOT # [id number]  . CATARACT EXTRACTION W/PHACO Right 09/23/2018   Procedure: CATARACT EXTRACTION PHACO AND INTRAOCULAR LENS PLACEMENT ([place]);  Surgeon: Eulogio Bear, MD;  Location: [facility] ORS;  Service: Ophthalmology;  Laterality: Right;  [place] 00:45.0 CDE 5.19 Fluid Pack lot # [id number]  . CERVICAL FUSION  C5 - 6/C6-7  . CHOLECYSTECTOMY  1972  . COLONOSCOPY WITH PROPOFOL N/A 07/27/2015   Procedure: COLONOSCOPY WITH PROPOFOL;  Surgeon: Hulen Luster, MD;  Location[facility] END[facility]COPY;  Service: Gastroenterology;  Laterality: N/A;  . ESOPHAGOGASTRODUODENOSCOPY (EGD) WITH PROPOFOL N/A 07/27/2015   Procedure: ESOPHAGOGASTRODUODENOSCOPY (EGD) WITH PROPOFOL;  Surgeon: Hulen Luster, MD;  Location: [facility] ENDOSCOPY;  Service: Gastroenterology;  Laterality: N/A;  . ESOPHAGOGASTRODUODENOSCOPY (EGD) WITH PROPOFOL N/A 05/19/2018   Procedure: ESOPHAGOGASTRODUODENOSCOPY (EGD) WITH PROPOFOL;  Surgeon: Lucilla Lame, MD;  Location: [facility] ENDOSCOPY;  Service: Endoscopy;  Laterality: N/A;  .  EYE SURGERY    . FOOT SURGERY Bilateral    5-6 years per patient  . SPINE SURGERY    . TONSILLECTOMY    . VASCULAR SURGERY     LEG STENTS    Prior to Admission medications   Medication Sig Start Date End Date Taking? Authorizing Provider  acetaminophen (TYLENOL) 325 MG tablet Take 2 tablets (650 mg total) by mouth every 6 (six) hours as needed for mild pain (or Fever >/= 101). 05/21/18  Yes Wieting, Richard, MD  albuterol (PROVENTIL) (2.5 MG/3ML) 0.083% nebulizer  solution Take 3 mLs (2.5 mg total) by nebulization every 4 (four) hours as needed for wheezing. 12/24/17  Yes Birdie Sons, MD  alendronate (FOSAMAX) 70 MG tablet Take 1 tablet (70 mg total) by mouth every 7 (seven) days. Take with a full glass of water on an empty stomach. Patient taking differently: Take 70 mg by mouth every Monday. Take with a full glass of water on an empty stomach. 01/23/18  Yes Birdie Sons, MD  ALPRAZolam Duanne Moron) 0.5 MG tablet Take 0.5-1 tablets (0.25-0.5 mg total) by mouth at bedtime as needed for anxiety. 08/19/18  Yes Birdie Sons, MD  baclofen (LIORESAL) 10 MG tablet Take 1 tablet (10 mg total) by mouth 3 (three) times daily as needed for muscle spasms. 08/17/18  Yes Triplett, Cari B, FNP  benzonatate (TESSALON PERLES) 100 MG capsule Take 1 capsule (100 mg total) by mouth 3 (three) times daily as needed for cough. 09/15/18  Yes Birdie Sons, MD  celecoxib (CELEBREX) 100 MG capsule Take 1 capsule (100 mg total) by mouth 2 (two) times daily as needed. 09/28/18  Yes Birdie Sons, MD  clopidogrel (PLAVIX) 75 MG tablet Take 75 mg by mouth daily.   Yes [provider]  diphenoxylate-atropine (LOMOTIL) 2.5-0.025 MG tablet Take 1 tablet by mouth as needed for diarrhea or loose stools. 06/10/18  Yes [provider]  estradiol (ESTRACE) 0.5 MG tablet Take 1 tablet (0.5 mg total) by mouth every other day. 09/09/18  Yes Birdie Sons, MD  fentaNYL (DURAGESIC - DOSED MCG/HR) 50 MCG/HR Place 1 patch (50 mcg total) onto the skin every 3 (three) days. 11/29/18 12/29/18 Yes Vevelyn Francois, NP  ferrous sulfate 325 (65 FE) MG tablet Take 1 tablet (325 mg total) by mouth 2 (two) times daily with a meal. 09/23/18  Yes Birdie Sons, MD  Fluticasone-Salmeterol (ADVAIR DISKUS) 250-50 MCG/DOSE AEPB TAKE ONE PUFF TWICE DAILY. 09/28/18  Yes Birdie Sons, MD  memantine (NAMENDA) 5 MG tablet Take 5 mg by mouth 2 (two) times daily.   Yes [provider]    NARCAN 4 MG/0.1ML LIQD nasal spray kit Place 0.4 mg into the nose daily as needed (for accidental overdose.).  09/11/17  Yes [provider]  ondansetron (ZOFRAN) 4 MG tablet Take 4 mg by mouth as needed. 08/04/18  Yes [provider]  pantoprazole (PROTONIX) 40 MG tablet Take 40 mg by mouth 2 (two) times daily. Take 1 tablet (40 mg) by mouth scheduled every morning, may repeat dose in evening if needed for heartburn/indigestion.   Yes [provider]  pregabalin (LYRICA) 300 MG capsule Take 300 mg by mouth 2 (two) times daily.   Yes [provider]  silver sulfADIAZINE (SILVADENE) 1 % cream Apply 1 application topically daily as needed (leg infection).    Yes [provider]  simvastatin (ZOCOR) 10 MG tablet Take 10 mg by mouth at bedtime.  Yes [provider]  Suvorexant (BELSOMRA) 5 MG TABS Take 1 tablet by mouth at bedtime as needed (for insomnia). Patient taking differently: Take 5 mg by mouth at bedtime.  07/31/18  Yes Birdie Sons, MD  tiotropium (SPIRIVA HANDIHALER) 18 MCG inhalation capsule inhale the contents of one capsule in the handihaler once daily Patient taking differently: Place 18 mcg into inhaler and inhale daily. inhale the contents of one capsule in the handihaler once daily 01/23/18  Yes Fisher, Kirstie Peri, MD  triamcinolone (KENALOG) 0.025 % ointment Apply 1 application topically 2 (two) times daily. Patient taking differently: Apply 1 application topically 2 (two) times daily as needed (cracking skin).  06/15/18  Yes Birdie Sons, MD  VENTOLIN HFA 108 [phone] Base) MCG/ACT inhaler Inhale 2 puffs into the lungs every 4 (four) hours as needed for wheezing or shortness of breath. Patient taking differently: Inhale 2 puffs into the lungs every 4 (four) hours as needed for wheezing or shortness of breath.  08/21/18  Yes Birdie Sons, MD  vitamin B-12 (CYANOCOBALAMIN) 1000 MCG tablet Take 1 tablet (1,000 mcg total) by mouth  daily. 05/21/18  Yes Wieting, Richard, MD  fentaNYL (DURAGESIC - DOSED MCG/HR) 50 MCG/HR Place 1 patch (50 mcg total) onto the skin every 3 (three) days. Patient not taking: Reported on 11/03/2018 01/28/19 02/27/19  Vevelyn Francois, NP  fentaNYL (DURAGESIC - DOSED MCG/HR) 50 MCG/HR Place 1 patch (50 mcg total) onto the skin every 3 (three) days. Patient not taking: Reported on 11/03/2018 12/29/18 01/28/19  Vevelyn Francois, NP    Allergies Percocet [oxycodone-acetaminophen]; Aspirin; Codeine; Propoxyphene; and Sulfa antibiotics  Family History  Problem Relation Age of Onset  . Cancer Mother   . Arthritis Mother   . Heart disease Mother   . Diabetes Mother        mellitus, type 2  . Heart disease Father   . Diabetes Sister   . Cancer Brother   . Cancer Brother        lung  . Diabetes Brother     Social History Social History   Tobacco Use  . Smoking status: Current Every Day Smoker    Packs/day: 1.00    Years: 50.00    Pack years: 50.00    Types: Cigarettes  . Smokeless tobacco: Never Used  . Tobacco comment: Previously smoked 2 ppd  Substance Use Topics  . Alcohol use: No    Alcohol/week: 0.0 standard drinks  . Drug use: No    Review of Systems  Constitutional: Positive for fever Eyes: No visual changes. ENT: No sore throat. Cardiovascular: Denies chest pain. Respiratory: Positive for cough and shortness of breath. Gastrointestinal: No abdominal pain.  No nausea, no vomiting.  No diarrhea.  No constipation. Genitourinary: Negative for dysuria. Musculoskeletal: Negative for back pain. Skin: Negative for rash. Neurological: Negative for headaches, focal weakness or numbness.   ____________________________________________   PHYSICAL EXAM:  VITAL SIGNS: ED Triage Vitals  Enc Vitals Group     BP 11/03/18 0608 (!) 152/99     Pulse Rate 11/03/18 0608 (!) 130     Resp 11/03/18 0608 (!) 24     Temp 11/03/18 0608 (!) 100.5 F (38.1 C)     Temp Source 11/03/18 0608  Oral     SpO2 11/03/18 0608 (!) 83 %     Weight 11/03/18 0607 116 lb (52.6 kg)     Height 11/03/18 0607 4' 10" (1.473 m)     Head  Circumference --      Peak Flow --      Pain Score --      Pain Loc --      Pain Edu? --      Excl. in [place]? --     Constitutional: Decreased LOC.  Ill appearing and in moderate acute distress. Eyes: Conjunctivae are normal. PERRL. EOMI. Head: Atraumatic. Nose: Congestion/rhinnorhea. Mouth/Throat: Mucous membranes are moist.  Oropharynx non-erythematous. Neck: No stridor.   Cardiovascular: Tachycardic rate, regular rhythm. Grossly normal heart sounds.  Good peripheral circulation. Respiratory: Increased respiratory effort.  No retractions. Lungs with rhonchi and rales. Gastrointestinal: Soft and nontender. No distention. No abdominal bruits. No CVA tenderness. Musculoskeletal: No lower extremity tenderness nor edema.  No joint effusions. Neurologic: Decreased LOC. Skin:  Skin is hot, dry and intact. No rash noted. Psychiatric: Unable to assess. ____________________________________________   LABS (all labs ordered are listed, but only abnormal results are displayed)  Labs Reviewed  COMPREHENSIVE METABOLIC PANEL - Abnormal; Notable for the following components:      Result Value   Glucose, Bld 109 (*)    Creatinine, Ser 1.12 (*)    GFR calc non Af Amer 50 (*)    GFR calc Af Amer 58 (*)    All other components within normal limits  CBC WITH DIFFERENTIAL/PLATELET - Abnormal; Notable for the following components:   WBC 13.2 (*)    Neutro Abs 12.0 (*)    Lymphs Abs 0.5 (*)    All other components within normal limits  URINALYSIS, ROUTINE W REFLEX MICROSCOPIC - Abnormal; Notable for the following components:   Color, Urine STRAW (*)    APPearance CLEAR (*)    All other components within normal limits  TROPONIN I - Abnormal; Notable for the following components:   Troponin I 0.18 (*)    All other components within normal limits  BLOOD GAS, ARTERIAL  - Abnormal; Notable for the following components:   pCO2 arterial 55 (*)    pO2, Arterial 73 (*)    Bicarbonate 32.5 (*)    Acid-Base Excess 5.8 (*)    All other components within normal limits  CULTURE, BLOOD (ROUTINE X 2)  CULTURE, BLOOD (ROUTINE X 2)  URINE CULTURE  BRAIN NATRIURETIC PEPTIDE  INFLUENZA PANEL BY PCR (TYPE A & B)  I-STAT CG4 LACTIC ACID, ED  CG4 I-STAT (LACTIC ACID)  I-STAT CG4 LACTIC ACID, ED   ____________________________________________  EKG  ED ECG REPORT I, SUNG,JADE J, the attending physician, personally viewed and interpreted this ECG.   Date: 11/03/2018  EKG Time: 0634  Rate: 124  Rhythm: sinus tachycardia  Axis: Normal  Intervals: None  ST&T Change: Marked ST depression  ____________________________________________  RADIOLOGY  ED MD interpretation: Bibasilar scarring versus developing infiltrates  Official radiology report(s): Dg Chest Port 1 View  Result Date: 11/03/2018 CLINICAL DATA:  67 year old female with fever and cough. EXAM: PORTABLE CHEST 1 VIEW COMPARISON:  Chest radiograph dated 06/30/2018 FINDINGS: Bibasilar interstitial densities may represent chronic changes and scarring although recurrent infiltrate is not entirely excluded. Clinical correlation is recommended. There is no pleural effusion or pneumothorax. Stable mild cardiomegaly. No acute osseous pathology. IMPRESSION: Bibasilar chronic changes/scarring versus developing infiltrate. Electronically Signed   By: Anner Crete M.D.   On: 11/03/2018 06:38    ____________________________________________   PROCEDURES  Procedure(s) performed: None  Procedures  Critical Care performed: Yes, see critical care note(s)   CRITICAL CARE Performed by: Paulette Blanch   Total critical care time:  45 minutes  Critical care time was exclusive of separately billable procedures and treating other patients.  Critical care was necessary to treat or prevent imminent or  life-threatening deterioration.  Critical care was time spent personally by me on the following activities: development of treatment plan with patient and/or surrogate as well as nursing, discussions with consultants, evaluation of patient's response to treatment, examination of patient, obtaining history from patient or surrogate, ordering and performing treatments and interventions, ordering and review of laboratory studies, ordering and review of radiographic studies, pulse oximetry and re-evaluation of patient's condition.  ____________________________________________   INITIAL IMPRESSION / ASSESSMENT AND PLAN / ED COURSE  As part of my medical decision making, I reviewed the following data within the [facility] History obtained from family, Nursing notes reviewed and incorporated, Labs reviewed, EKG interpreted, Old chart reviewed, Radiograph reviewed and Notes from prior ED visits   67 year old female with COPD who presents with fever and respiratory distress. Differential includes, but is not limited to, viral syndrome, bronchitis including COPD exacerbation, pneumonia, reactive airway disease including asthma, CHF including exacerbation with or without pulmonary/interstitial edema, pneumothorax, ACS, thoracic trauma, and pulmonary embolism.  ED code sepsis initiated upon patient's arrival to the treatment room.  Will obtain rectal temperature, stool guaiac.  IV fluid resuscitation initiated.  On nasal cannula oxygen patient is 92 to 93%.  Clinical Course as of Nov 03 740  Tue Nov 03, 2018  0728 Updated family members of laboratory results.  Lactate is normal.  Patient remains hemodynamically stable.  Tachycardia is improving.  On nasal cannula oxygen she is 98%.  IV antibiotics have been ordered.  Discussed with Dr. Brett Albino from hospitalist services who will evaluate patient in emergency department for admission.   [JS]  7782 Stool was guiac negative. Given ST depression  on EKG coupled with elevated troponin, will initiate heparin bolus and drip.   [JS]    Clinical Course User Index [JS] Paulette Blanch, MD     ____________________________________________   FINAL CLINICAL IMPRESSION(S) / ED DIAGNOSES  Final diagnoses:  Shortness of breath  Fever, unspecified fever cause  Respiratory distress  Community acquired pneumonia, unspecified laterality  Aspiration pneumonia, unspecified aspiration pneumonia type, unspecified laterality, unspecified part of lung ([place])  Hypoxia  NSTEMI (non-ST elevated myocardial infarction) ([place])  Elevated troponin     ED Discharge Orders    None       Note:  This document was prepared using Dragon voice recognition software and may include unintentional dictation errors.    Paulette Blanch, MD 11/03/18 0730    Paulette Blanch, MD 11/03/18 [phone]

## 2018-11-03 NOTE — Progress Notes (Signed)
ANTICOAGULATION CONSULT NOTE - Initial Consult  Pharmacy Consult for Heparin Drip Indication: chest pain/ACS  Allergies  Allergen Reactions  . Percocet [Oxycodone-Acetaminophen] Hives and Rash  . Aspirin Nausea And Vomiting and Other (See Comments)  . Codeine Nausea And Vomiting, Nausea Only and Other (See Comments)  . Propoxyphene Nausea Only and Other (See Comments)  . Sulfa Antibiotics Rash and Other (See Comments)    Patient Measurements: Height: 4\' 10"  (147.3 cm) Weight: 116 lb (52.6 kg) IBW/kg (Calculated) : 40.9 Heparin Dosing Weight: 52.6 kg  Vital Signs: Temp: 104.1 F (40.1 C) (12/10 0653) Temp Source: Rectal (12/10 0653) BP: 122/59 (12/10 0700) Pulse Rate: 118 (12/10 0700)  Labs: Recent Labs    11/03/18 0621  HGB 12.4  HCT 40.7  PLT 205  CREATININE 1.12*  TROPONINI 0.18*    Estimated Creatinine Clearance: 34.1 mL/min (A) (by C-G formula based on SCr of 1.12 mg/dL (H)).   Medical History: Past Medical History:  Diagnosis Date  . Acute postoperative pain 04/14/2017  . Allergy   . Anemia   . Anxiety   . Arthritis    fingers  . Aspiration pneumonitis ([facility]) 11/24/2015  . Asthma   . CHF (congestive heart failure) ([facility])   . Chronic pain   . COPD (chronic obstructive pulmonary disease) ([facility])   . Coronary artery disease   . DVT (deep venous thrombosis) ([facility])   . Dyspnea   . Dysrhythmia   . Encephalopathy    MULTIPLE TIMES  . GERD (gastroesophageal reflux disease)   . Headache   . Hyperlipidemia   . Hypertension   . Kyphoscoliosis deformity of spine   . Migraines   . Neuropathy 2010  . Osteoporosis   . Oxygen deficiency   . Peripheral vascular disease ([facility])   . Pneumonia   . Pneumonia 11/19/2015  . Pneumonia 10/2016  . Pneumonia    aspiration 2018- 4 times in last year  . Vitamin D deficiency   . Wears dentures    full upper and lower    Medications:  Scheduled:  . acetaminophen      . heparin  60 Units/kg Intravenous Once    Infusions:  . heparin    . vancomycin 1,000 mg (11/03/18 [id number])    Assessment: Pharmacy consulted to dose and monitor heparin drip for ACS/STEMI in this [age] yo female who presents to ED with fever and respiratory distress.    Goal of Therapy:  Heparin level 0.3-0.7 units/ml Monitor platelets by anticoagulation protocol: Yes   Plan:  Give 3150 units bolus x 1 Start heparin infusion at 600 units/hr Check anti-Xa level in 6 hours and daily while on heparin Continue to monitor H&H and platelets  Dameisha Tschida K, RPH 11/03/2018,7:47 AM

## 2018-11-03 NOTE — Progress Notes (Signed)
ANTICOAGULATION CONSULT NOTE - Initial Consult  Pharmacy Consult for Heparin Drip Indication: chest pain/ACS  Allergies  Allergen Reactions  . Percocet [Oxycodone-Acetaminophen] Hives and Rash  . Aspirin Nausea And Vomiting and Other (See Comments)  . Codeine Nausea And Vomiting, Nausea Only and Other (See Comments)  . Propoxyphene Nausea Only and Other (See Comments)  . Sulfa Antibiotics Rash and Other (See Comments)    Patient Measurements: Height: 4\' 10"  (147.3 cm) Weight: 114 lb 10.2 oz (52 kg) IBW/kg (Calculated) : 40.9 Heparin Dosing Weight: 52.6 kg  Vital Signs: Temp: 98 F (36.7 C) (12/10 0958) Temp Source: Axillary (12/10 0958) BP: 84/38 (12/10 0959) Pulse Rate: 105 (12/10 0959)  Labs: Recent Labs    11/03/18 0621 11/03/18 1015 11/03/18 1420  HGB 12.4 10.1* 10.3*  HCT 40.7 33.2* 33.8*  PLT 205 185  --   APTT 33 92*  --   LABPROT 13.0 14.6  --   INR 0.99 1.15  --   HEPARINUNFRC  --   --  0.21*  CREATININE 1.12* 0.97  --   TROPONINI 0.18* 1.19*  --     Estimated Creatinine Clearance: 39.1 mL/min (by C-G formula based on SCr of 0.97 mg/dL).   Medical History: Past Medical History:  Diagnosis Date  . Acute postoperative pain 04/14/2017  . Allergy   . Anemia   . Anxiety   . Arthritis    fingers  . Aspiration pneumonitis (HCC) 11/24/2015  . Asthma   . CHF (congestive heart failure) (HCC)   . Chronic pain   . COPD (chronic obstructive pulmonary disease) (HCC)   . Coronary artery disease   . DVT (deep venous thrombosis) (HCC)   . Dyspnea   . Dysrhythmia   . Encephalopathy    MULTIPLE TIMES  . GERD (gastroesophageal reflux disease)   . Headache   . Hyperlipidemia   . Hypertension   . Kyphoscoliosis deformity of spine   . Migraines   . Neuropathy 2010  . Osteoporosis   . Oxygen deficiency   . Peripheral vascular disease (HCC)   . Pneumonia   . Pneumonia 11/19/2015  . Pneumonia 10/2016  . Pneumonia    aspiration 2018- 4 times in last year   . Vitamin D deficiency   . Wears dentures    full upper and lower    Medications:  Scheduled:  . estradiol  0.5 mg Oral QODAY  . ferrous sulfate  325 mg Oral BID WC  . heparin  700 Units Intravenous Once  . memantine  5 mg Oral BID  . mometasone-formoterol  2 puff Inhalation BID  . pantoprazole (PROTONIX) IV  40 mg Intravenous Q12H  . pregabalin  150 mg Oral BID  . simvastatin  10 mg Oral QHS  . vitamin B-12  1,000 mcg Oral Daily   Infusions:  . heparin 600 Units/hr (11/03/18 0810)  . phenylephrine (NEO-SYNEPHRINE) Adult infusion    . piperacillin-tazobactam (ZOSYN)  IV 3.375 g (11/03/18 1420)  . sodium chloride     And  . sodium chloride     And  . sodium chloride    . sodium chloride    . [START ON 11/04/2018] vancomycin      Assessment: Pharmacy consulted to dose and monitor heparin drip for ACS/STEMI in this 66 yo female who presents to ED with fever and respiratory distress.   12/10 Heparin infusion started @ 600 units/hr   Goal of Therapy:  Heparin level 0.3-0.7 units/ml Monitor platelets by anticoagulation protocol: Yes  Plan:  12/10 @  1500 HL 0.21. Level is subtherapeutic. Will order heparin 700 unit bolus and increase infusion to heparin 700 units/hr.  Recheck HL in 6 hours.  CBC and HL with AM labs per protocol.   Gardner CandleSheema M Albie Arizpe, PharmD, BCPS Clinical Pharmacist 11/03/2018 3:44 PM

## 2018-11-03 NOTE — H&P (Signed)
Cheryl [name] at [place] NAME: Cheryl [name]    MR#:  [id number]  DATE OF BIRTH:  Dec 18, 1950  DATE OF ADMISSION:  11/03/2018  PRIMARY CARE PHYSICIAN: Birdie Sons, MD   REQUESTING/REFERRING PHYSICIAN: Paulette Blanch, MD  CHIEF COMPLAINT:   Chief Complaint  Patient presents with  . Shortness of Breath    HISTORY OF PRESENT ILLNESS:  Cheryl [name]  is a 67 y.o. female with a known history of COPD, CHF is being admitted for suspected sepsis. Was having shortness of breath.  Patient has a history of COPD with oxygen to use at home as needed.  Over the past week, has been noted patient with congested cough and using oxygen more than usual.  States "black stuff" is coming out of her nose and when she coughs.  Patient arrives grunting, decreased LOC and nonverbal since y'day which is not her baseline. In ED, she was noted to have pneumonia on CXR and Tmax upto 104 and is being admitted for sepsis. Her troponin is elevated for which EDP has started heparin drip. PAST MEDICAL HISTORY:   Past Medical History:  Diagnosis Date  . Acute postoperative pain 04/14/2017  . Allergy   . Anemia   . Anxiety   . Arthritis    fingers  . Aspiration pneumonitis ([place]) 11/24/2015  . Asthma   . CHF (congestive heart failure) ([place])   . Chronic pain   . COPD (chronic obstructive pulmonary disease) ([place])   . Coronary artery disease   . DVT (deep venous thrombosis) ([place])   . Dyspnea   . Dysrhythmia   . Encephalopathy    MULTIPLE TIMES  . GERD (gastroesophageal reflux disease)   . Headache   . Hyperlipidemia   . Hypertension   . Kyphoscoliosis deformity of spine   . Migraines   . Neuropathy 2010  . Osteoporosis   . Oxygen deficiency   . Peripheral vascular disease ([place])   . Pneumonia   . Pneumonia 11/19/2015  . Pneumonia 10/2016  . Pneumonia    aspiration 2018- 4 times in last year  . Vitamin D deficiency   . Wears dentures    full upper and lower     PAST SURGICAL HISTORY:   Past Surgical History:  Procedure Laterality Date  . ABDOMINAL HYSTERECTOMY  1975   Bilaterl Oophorectomy; [place] to IUD infection  . abdomnal aortic stent  05/30/2008   Dr. Quay Burow  . APPENDECTOMY    . APPENDECTOMY    . BACK SURGERY    . cardiac catherization  10/31/2009  . CARDIAC CATHETERIZATION    . CATARACT EXTRACTION W/PHACO Left 08/20/2018   Procedure: CATARACT EXTRACTION PHACO AND INTRAOCULAR LENS PLACEMENT (IOC);  Surgeon: Eulogio Bear, MD;  Location: [facility] ORS;  Service: Ophthalmology;  Laterality: Left;  [place] 00:45.1 AP% 12.3 CDE 5.54 FLUID PACK LOT # [id number]  . CATARACT EXTRACTION W/PHACO Right 09/23/2018   Procedure: CATARACT EXTRACTION PHACO AND INTRAOCULAR LENS PLACEMENT ([place]);  Surgeon: Eulogio Bear, MD;  Location: [facility] ORS;  Service: Ophthalmology;  Laterality: Right;  [place] 00:45.0 CDE 5.19 Fluid Pack lot # [id number]  . CERVICAL FUSION  C5 - 6/C6-7  . CHOLECYSTECTOMY  1972  . COLONOSCOPY WITH PROPOFOL N/A 07/27/2015   Procedure: COLONOSCOPY WITH PROPOFOL;  Surgeon: Hulen Luster, MD;  Location: [facility] ENDOSCOPY;  Service: Gastroenterology;  Laterality: N/A;  . ESOPHAGOGASTRODUODENOSCOPY (EGD) WITH PROPOFOL N/A 07/27/2015   Procedure: ESOPHAGOGASTRODUODENOSCOPY (EGD) WITH PROPOFOL;  Surgeon: Hulen Luster, MD;  Location[facility] ENDOSCOPY;  Service: Gastroenterology;  Laterality: N/A;  . ESOPHAGOGASTRODUODENOSCOPY (EGD) WITH PROPOFOL N/A 05/19/2018   Procedure: ESOPHAGOGASTRODUODENOSCOPY (EGD) WITH PROPOFOL;  Surgeon: Lucilla Lame, MD;  Location: [facility] ENDOSCOPY;  Service: Endoscopy;  Laterality: N/A;  . EYE SURGERY    . FOOT SURGERY Bilateral    5-6 years per patient  . SPINE SURGERY    . TONSILLECTOMY    . VASCULAR SURGERY     LEG STENTS    SOCIAL HISTORY:   Social History   Tobacco Use  . Smoking status: Current Every Day Smoker    Packs/day: 1.00    Years: 50.00    Pack years: 50.00    Types: Cigarettes  . Smokeless tobacco:  Never Used  . Tobacco comment: Previously smoked 2 ppd  Substance Use Topics  . Alcohol use: No    Alcohol/week: 0.0 standard drinks    FAMILY HISTORY:   Family History  Problem Relation Age of Onset  . Cancer Mother   . Arthritis Mother   . Heart disease Mother   . Diabetes Mother        mellitus, type 2  . Heart disease Father   . Diabetes Sister   . Cancer Brother   . Cancer Brother        lung  . Diabetes Brother     DRUG ALLERGIES:   Allergies  Allergen Reactions  . Percocet [Oxycodone-Acetaminophen] Hives and Rash  . Aspirin Nausea And Vomiting and Other (See Comments)  . Codeine Nausea And Vomiting, Nausea Only and Other (See Comments)  . Propoxyphene Nausea Only and Other (See Comments)  . Sulfa Antibiotics Rash and Other (See Comments)    REVIEW OF SYSTEMS:   ROS: unable to obtain due to mental status MEDICATIONS AT HOME:   Prior to Admission medications   Medication Sig Start Date End Date Taking? Authorizing Provider  acetaminophen (TYLENOL) 325 MG tablet Take 2 tablets (650 mg total) by mouth every 6 (six) hours as needed for mild pain (or Fever >/= 101). 05/21/18  Yes Wieting, Richard, MD  albuterol (PROVENTIL) (2.5 MG/3ML) 0.083% nebulizer solution Take 3 mLs (2.5 mg total) by nebulization every 4 (four) hours as needed for wheezing. 12/24/17  Yes Birdie Sons, MD  alendronate (FOSAMAX) 70 MG tablet Take 1 tablet (70 mg total) by mouth every 7 (seven) days. Take with a full glass of water on an empty stomach. Patient taking differently: Take 70 mg by mouth every Monday. Take with a full glass of water on an empty stomach. 01/23/18  Yes Birdie Sons, MD  ALPRAZolam Duanne Moron) 0.5 MG tablet Take 0.5-1 tablets (0.25-0.5 mg total) by mouth at bedtime as needed for anxiety. 08/19/18  Yes Birdie Sons, MD  baclofen (LIORESAL) 10 MG tablet Take 1 tablet (10 mg total) by mouth 3 (three) times daily as needed for muscle spasms. 08/17/18  Yes Triplett, Cari B,  FNP  benzonatate (TESSALON PERLES) 100 MG capsule Take 1 capsule (100 mg total) by mouth 3 (three) times daily as needed for cough. 09/15/18  Yes Birdie Sons, MD  celecoxib (CELEBREX) 100 MG capsule Take 1 capsule (100 mg total) by mouth 2 (two) times daily as needed. 09/28/18  Yes Birdie Sons, MD  clopidogrel (PLAVIX) 75 MG tablet Take 75 mg by mouth daily.   Yes [provider]  diphenoxylate-atropine (LOMOTIL) 2.5-0.025 MG tablet Take 1 tablet by mouth as needed for diarrhea or loose  stools. 06/10/18  Yes [provider]  estradiol (ESTRACE) 0.5 MG tablet Take 1 tablet (0.5 mg total) by mouth every other day. 09/09/18  Yes Birdie Sons, MD  fentaNYL (DURAGESIC - DOSED MCG/HR) 50 MCG/HR Place 1 patch (50 mcg total) onto the skin every 3 (three) days. 11/29/18 12/29/18 Yes Vevelyn Francois, NP  ferrous sulfate 325 (65 FE) MG tablet Take 1 tablet (325 mg total) by mouth 2 (two) times daily with a meal. 09/23/18  Yes Birdie Sons, MD  Fluticasone-Salmeterol (ADVAIR DISKUS) 250-50 MCG/DOSE AEPB TAKE ONE PUFF TWICE DAILY. 09/28/18  Yes Birdie Sons, MD  memantine (NAMENDA) 5 MG tablet Take 5 mg by mouth 2 (two) times daily.   Yes [provider]  NARCAN 4 MG/0.1ML LIQD nasal spray kit Place 0.4 mg into the nose daily as needed (for accidental overdose.).  09/11/17  Yes [provider]  ondansetron (ZOFRAN) 4 MG tablet Take 4 mg by mouth as needed. 08/04/18  Yes [provider]  pantoprazole (PROTONIX) 40 MG tablet Take 40 mg by mouth 2 (two) times daily. Take 1 tablet (40 mg) by mouth scheduled every morning, may repeat dose in evening if needed for heartburn/indigestion.   Yes [provider]  pregabalin (LYRICA) 300 MG capsule Take 300 mg by mouth 2 (two) times daily.   Yes [provider]  silver sulfADIAZINE (SILVADENE) 1 % cream Apply 1 application topically daily as needed (leg infection).    Yes [provider]   simvastatin (ZOCOR) 10 MG tablet Take 10 mg by mouth at bedtime.    Yes [provider]  Suvorexant (BELSOMRA) 5 MG TABS Take 1 tablet by mouth at bedtime as needed (for insomnia). Patient taking differently: Take 5 mg by mouth at bedtime.  07/31/18  Yes Birdie Sons, MD  tiotropium (SPIRIVA HANDIHALER) 18 MCG inhalation capsule inhale the contents of one capsule in the handihaler once daily Patient taking differently: Place 18 mcg into inhaler and inhale daily. inhale the contents of one capsule in the handihaler once daily 01/23/18  Yes Fisher, Kirstie Peri, MD  triamcinolone (KENALOG) 0.025 % ointment Apply 1 application topically 2 (two) times daily. Patient taking differently: Apply 1 application topically 2 (two) times daily as needed (cracking skin).  06/15/18  Yes Birdie Sons, MD  VENTOLIN HFA 108 [phone] Base) MCG/ACT inhaler Inhale 2 puffs into the lungs every 4 (four) hours as needed for wheezing or shortness of breath. Patient taking differently: Inhale 2 puffs into the lungs every 4 (four) hours as needed for wheezing or shortness of breath.  08/21/18  Yes Birdie Sons, MD  vitamin B-12 (CYANOCOBALAMIN) 1000 MCG tablet Take 1 tablet (1,000 mcg total) by mouth daily. 05/21/18  Yes Wieting, Richard, MD  fentaNYL (DURAGESIC - DOSED MCG/HR) 50 MCG/HR Place 1 patch (50 mcg total) onto the skin every 3 (three) days. Patient not taking: Reported on 11/03/2018 01/28/19 02/27/19  Vevelyn Francois, NP  fentaNYL (DURAGESIC - DOSED MCG/HR) 50 MCG/HR Place 1 patch (50 mcg total) onto the skin every 3 (three) days. Patient not taking: Reported on 11/03/2018 12/29/18 01/28/19  Vevelyn Francois, NP      VITAL SIGNS:  Blood pressure (!) 122/59, pulse (!) 118, temperature (!) 104.1 F (40.1 C), temperature source Rectal, resp. rate 20, height _0  (1.473 m), weight 52.6 kg, SpO2 98 %.  PHYSICAL EXAMINATION:  Physical Exam  GENERAL:  67 y.o.-year-old patient lying in the bed lethargic  EYES:  Pupils equal, round, reactive to light and accommodation. No scleral icterus. Extraocular muscles intact.  HEENT: Head atraumatic, normocephalic. Oropharynx and nasopharynx clear.  NECK:  Supple, no jugular venous distention. No thyroid enlargement, no tenderness.  LUNGS: Decreased breath sounds bilaterally, rhonchi  + no wheezing, rales,or crepitation. No use of accessory muscles of respiration.  CARDIOVASCULAR: S1, S2 normal. No murmurs, rubs, or gallops.  ABDOMEN: Soft, nontender, nondistended. Bowel sounds present. No organomegaly or mass.  EXTREMITIES: No pedal edema, cyanosis, or clubbing.  NEUROLOGIC: Cranial nerves II through XII are intact. Muscle strength 5/5 in all extremities. Sensation intact. Gait not checked.  PSYCHIATRIC: The patient is lethargic SKIN: No obvious rash, lesion, or ulcer.   LABORATORY PANEL:   CBC Recent Labs  Lab 11/03/18 0621  WBC 13.2*  HGB 12.4  HCT 40.7  PLT 205   ------------------------------------------------------------------------------------------------------------------  Chemistries  Recent Labs  Lab 11/03/18 0621  NA 140  K 4.1  CL 101  CO2 32  GLUCOSE 109*  BUN 17  CREATININE 1.12*  CALCIUM 9.1  AST 17  ALT 9  ALKPHOS 67  BILITOT 0.8   ------------------------------------------------------------------------------------------------------------------  Cardiac Enzymes Recent Labs  Lab 11/03/18 0621  TROPONINI 0.18*   ------------------------------------------------------------------------------------------------------------------  RADIOLOGY:  Dg Chest Port 1 View  Result Date: 11/03/2018 CLINICAL DATA:  67 year old female with fever and cough. EXAM: PORTABLE CHEST 1 VIEW COMPARISON:  Chest radiograph dated 06/30/2018 FINDINGS: Bibasilar interstitial densities may represent chronic changes and scarring although recurrent infiltrate is not entirely excluded. Clinical correlation is recommended. There is no pleural effusion  or pneumothorax. Stable mild cardiomegaly. No acute osseous pathology. IMPRESSION: Bibasilar chronic changes/scarring versus developing infiltrate. Electronically Signed   By: Anner Crete M.D.   On: 11/03/2018 06:38   IMPRESSION AND PLAN:  62 y f with sepsis  * Sepsis - present on admission due to pna - use sepsis protocol, Step-down admit - ID & PCCM c/s  * Pneumonia - IV Abx - pan c/s  * Elevated troponins - due to supply demand ischemia from sepsis - serial troponins - continue heparin drip till rules out  * Acute metabolic encephalopathy - likely due to sepsis - hold off Benzo and Narcotics    All the records are reviewed and case discussed with ED provider. Management plans discussed with the patient, family (Husband and sister at bedside) and they are in agreement.  CODE STATUS: FULL CODE  TOTAL TIME (Critical Care) TAKING CARE OF THIS PATIENT: 45 minutes.    Max Sane M.D on 11/03/2018 at 7:59 AM  Between 7am to 6pm - Pager - [phone]  After 6pm go to [email or web address] - Proofreader  [facility]  Office  [phone]  CC: Primary care physician; Birdie Sons, MD   Note: This dictation was prepared with Dragon dictation along with smaller phrase technology. Any transcriptional errors that result from this process are unintentional.

## 2018-11-03 NOTE — Progress Notes (Signed)
*  PRELIMINARY RESULTS* Echocardiogram 2D Echocardiogram has been performed.  Joanette GulaJoan M Adaira Centola 11/03/2018, 1:15 PM

## 2018-11-03 NOTE — ED Notes (Signed)
ED TO INPATIENT HANDOFF REPORT  Name/Age/Gender Cheryl [name] 67 y.o. female  Code Status    Code Status Orders  (From admission, onward)         Start     Ordered   11/03/18 0757  Full code  Continuous     11/03/18 0756        Code Status History    Date Active Date Inactive Code Status Order ID Comments User Context   06/18/2018 1428 06/22/2018 1454 Full Code [id number]  Alford HighlandWieting, Richard, MD ED   06/01/2018 1535 06/05/2018 2007 Full Code [id number]  Altamese DillingVachhani, Vaibhavkumar, MD Inpatient   05/14/2018 1351 05/21/2018 1558 Partial Code [id number]  Ihor AustinPyreddy, Pavan, MD ED   02/08/2018 1835 02/09/2018 1920 Full Code [id number]  Altamese DillingVachhani, Vaibhavkumar, MD Inpatient   02/05/2018 2025 02/07/2018 2119 Full Code [id number]  Milagros LollSudini, Srikar, MD ED   04/29/2017 1314 05/02/2017 2148 Full Code [id number]  Katha HammingKonidena, Snehalatha, MD ED   12/23/2016 1745 12/25/2016 1849 Full Code [id number]  Katharina CaperVaickute, Rima, MD ED   11/06/2016 0900 11/07/2016 1853 Full Code [id number]  Delfino LovettShah, Vipul, MD Inpatient   11/05/2016 1401 11/06/2016 0900 Partial Code [id number]  Adrian SaranMody, Sital, MD Inpatient   06/03/2016 0253 06/05/2016 1945 Full Code [id number]  Gery Prayrosley, Debby, MD Inpatient   11/19/2015 1218 11/24/2015 1624 Full Code [id number]  Shaune Pollackhen, Qing, MD Inpatient   11/09/2015 2302 11/13/2015 1851 Full Code [id number]  Wyatt HasteHower, David K, MD ED   10/17/2015 2226 10/22/2015 1303 Full Code [id number]  Enedina FinnerPatel, Sona, MD Inpatient   10/09/2015 1738 10/12/2015 1934 Full Code [id number]  Shaune Pollackhen, Qing, MD Inpatient   08/01/2015 0008 08/03/2015 1454 Full Code [id number]  Oralia ManisWillis, David, MD Inpatient      Home/SNF/Other home  Chief Complaint Cough, nausea  Level of Care/Admitting Diagnosis ED Disposition    ED Disposition Condition Comment   Admit  Hospital Area: [facility] [[id number]]  Level of Care: Stepdown [14]  Diagnosis: Sepsis [facility](HCC) [[id number]]  Admitting Physician: Delfino LovettSHAH, VIPUL [[id number]]  Attending Physician: Delfino LovettSHAH, VIPUL [[id number]]   Estimated length of stay: past midnight tomorrow  Certification:: I certify this patient will need inpatient services for at least 2 midnights  PT Class (Do Not Modify): Inpatient [101]  PT Acc Code (Do Not Modify): Private [1]       Medical History Past Medical History:  Diagnosis Date  . Acute postoperative pain 04/14/2017  . Allergy   . Anemia   . Anxiety   . Arthritis    fingers  . Aspiration pneumonitis (HCC) 11/24/2015  . Asthma   . CHF (congestive heart failure) (HCC)   . Chronic pain   . COPD (chronic obstructive pulmonary disease) (HCC)   . Coronary artery disease   . DVT (deep venous thrombosis) (HCC)   . Dyspnea   . Dysrhythmia   . Encephalopathy    MULTIPLE TIMES  . GERD (gastroesophageal reflux disease)   . Headache   . Hyperlipidemia   . Hypertension   . Kyphoscoliosis deformity of spine   . Migraines   . Neuropathy 2010  . Osteoporosis   . Oxygen deficiency   . Peripheral vascular disease (HCC)   . Pneumonia   . Pneumonia 11/19/2015  . Pneumonia 10/2016  . Pneumonia    aspiration 2018- 4 times in last year  . Vitamin D deficiency   . Wears dentures    full upper and lower    Allergies Allergies  Allergen Reactions  . Percocet [Oxycodone-Acetaminophen] Hives and Rash  .  Aspirin Nausea And Vomiting and Other (See Comments)  . Codeine Nausea And Vomiting, Nausea Only and Other (See Comments)  . Propoxyphene Nausea Only and Other (See Comments)  . Sulfa Antibiotics Rash and Other (See Comments)    IV Location/Drains/Wounds Patient Lines/Drains/Airways Status   Active Line/Drains/Airways    Name:   Placement date:   Placement time:   Site:   Days:   Peripheral IV 11/03/18 Right Antecubital   11/03/18    0638    Antecubital   less than 1   Peripheral IV 11/03/18 Left Wrist   11/03/18    0641    Wrist   less than 1   Incision (Closed) 08/20/18 Eye Left   08/20/18    0705     75   Incision (Closed) 09/23/18 Eye Right   09/23/18    0716     41           Labs/Imaging Results for orders placed or performed during the hospital encounter of 11/03/18 (from the past 48 hour(s))  Comprehensive metabolic panel     Status: Abnormal   Collection Time: 11/03/18  6:21 AM  Result Value Ref Range   Sodium 140 135 - 145 mmol/L   Potassium 4.1 3.5 - 5.1 mmol/L   Chloride 101 98 - 111 mmol/L   CO2 32 22 - 32 mmol/L   Glucose, Bld 109 (H) 70 - 99 mg/dL   BUN 17 8 - 23 mg/dL   Creatinine, Ser 8.41 (H) 0.44 - 1.00 mg/dL   Calcium 9.1 8.9 - 32.4 mg/dL   Total Protein 7.4 6.5 - 8.1 g/dL   Albumin 3.9 3.5 - 5.0 g/dL   AST 17 15 - 41 U/L   ALT 9 0 - 44 U/L   Alkaline Phosphatase 67 38 - 126 U/L   Total Bilirubin 0.8 0.3 - 1.2 mg/dL   GFR calc non Af Amer 50 (L) >60 mL/min   GFR calc Af Amer 58 (L) >60 mL/min   Anion gap 7 5 - 15    Comment: Performed at [facility], [address] [place], [place] [address]  CBC WITH DIFFERENTIAL     Status: Abnormal   Collection Time: 11/03/18  6:21 AM  Result Value Ref Range   WBC 13.2 (H) 4.0 - 10.5 K/uL   RBC 4.14 3.87 - 5.11 MIL/uL   Hemoglobin 12.4 12.0 - 15.0 g/dL   HCT 72.5 36.6 - 44.0 %   MCV 98.3 80.0 - 100.0 fL   MCH 30.0 26.0 - 34.0 pg   MCHC 30.5 30.0 - 36.0 g/dL   RDW 34.7 42.5 - 95.6 %   Platelets 205 150 - 400 K/uL   nRBC 0.0 0.0 - 0.2 %   Neutrophils Relative % 91 %   Neutro Abs 12.0 (H) 1.7 - 7.7 K/uL   Lymphocytes Relative 4 %   Lymphs Abs 0.5 (L) 0.7 - 4.0 K/uL   Monocytes Relative 4 %   Monocytes Absolute 0.5 0.1 - 1.0 K/uL   Eosinophils Relative 0 %   Eosinophils Absolute 0.0 0.0 - 0.5 K/uL   Basophils Relative 0 %   Basophils Absolute 0.0 0.0 - 0.1 K/uL   Immature Granulocytes 1 %   Abs Immature Granulocytes 0.07 0.00 - 0.07 K/uL    Comment: Performed at [facility], [address] [place], [place] [address]  Urinalysis, Routine w reflex microscopic     Status: Abnormal   Collection Time: 11/03/18  6:21  AM  Result Value Ref Range   Color, Urine  STRAW (A) YELLOW   APPearance CLEAR (A) CLEAR   Specific Gravity, Urine 1.011 1.005 - 1.030   pH 7.0 5.0 - 8.0   Glucose, UA NEGATIVE NEGATIVE mg/dL   Hgb urine dipstick NEGATIVE NEGATIVE   Bilirubin Urine NEGATIVE NEGATIVE   Ketones, ur NEGATIVE NEGATIVE mg/dL   Protein, ur NEGATIVE NEGATIVE mg/dL   Nitrite NEGATIVE NEGATIVE   Leukocytes, UA NEGATIVE NEGATIVE   RBC / HPF 0-5 0 - 5 RBC/hpf   WBC, UA 0-5 0 - 5 WBC/hpf   Bacteria, UA NONE SEEN NONE SEEN   Squamous Epithelial / LPF 0-5 0 - 5    Comment: Performed at [facility], [address] [place], [place] [address]  Troponin I - ONCE - STAT     Status: Abnormal   Collection Time: 11/03/18  6:21 AM  Result Value Ref Range   Troponin I 0.18 (HH) <0.03 ng/mL    Comment: CRITICAL RESULT CALLED TO, READ BACK BY AND VERIFIED WITH Farhaan Mabee@[email or web address]  ON 11/03/18 BY HKP Performed at [facility], [address] [place], [place] [address]   Brain natriuretic peptide     Status: None   Collection Time: 11/03/18  6:21 AM  Result Value Ref Range   B Natriuretic Peptide 58.0 0.0 - 100.0 pg/mL    Comment: Performed at [facility], [address] [place], [place] [address]  APTT     Status: None   Collection Time: 11/03/18  6:21 AM  Result Value Ref Range   aPTT 33 24 - 36 seconds    Comment: Performed at [facility], [address] [place], [place] [address]  Protime-INR     Status: None   Collection Time: 11/03/18  6:21 AM  Result Value Ref Range   Prothrombin Time 13.0 11.4 - 15.2 seconds   INR 0.99     Comment: Performed at [facility], [address], [place], [place] [address]  CG4 I-STAT (Lactic acid)     Status: None   Collection Time: 11/03/18  6:41 AM  Result Value Ref Range   Lactic Acid, Venous 1.83 0.5 - 1.9 mmol/L  Blood gas, arterial     Status: Abnormal   Collection Time: 11/03/18  6:56 AM  Result Value Ref Range   FIO2 0.36    Delivery systems NASAL CANNULA     pH, Arterial 7.38 7.350 - 7.450   pCO2 arterial 55 (H) 32.0 - 48.0 mmHg   pO2, Arterial 73 (L) 83.0 - 108.0 mmHg   Bicarbonate 32.5 (H) 20.0 - 28.0 mmol/L   Acid-Base Excess 5.8 (H) 0.0 - 2.0 mmol/L   O2 Saturation 94.2 %   Patient temperature 37.0    Collection site RIGHT RADIAL    Sample type ARTERIAL DRAW    Allens test (pass/fail) PASS PASS    Comment: Performed at [facility], [address] [place], [place] [address]  Influenza panel by PCR (type A & B)     Status: None   Collection Time: 11/03/18  7:23 AM  Result Value Ref Range   Influenza A By PCR NEGATIVE NEGATIVE   Influenza B By PCR NEGATIVE NEGATIVE    Comment: (NOTE) The Xpert Xpress Flu assay is intended as an aid in the diagnosis of  influenza and should not be used as a sole basis for treatment.  This  assay is FDA approved for nasopharyngeal swab specimens only.  Nasal  washings and aspirates are unacceptable for Xpert Xpress Flu testing. Performed at [facility], [address], [place], [place] [address]    Dg Chest [place] 1 View  Result Date: 11/03/2018 CLINICAL DATA:  67 year old female with fever and cough. EXAM: PORTABLE CHEST 1 VIEW COMPARISON:  Chest radiograph dated 06/30/2018 FINDINGS: Bibasilar interstitial densities may represent chronic changes and scarring although recurrent infiltrate is not entirely excluded. Clinical correlation is recommended. There is no pleural effusion or pneumothorax. Stable mild cardiomegaly. No acute osseous pathology. IMPRESSION: Bibasilar chronic changes/scarring versus developing infiltrate. Electronically Signed   By: Elgie Collard M.D.   On: 11/03/2018 06:38    Pending Labs Unresulted Labs (From admission, onward)    Start     Ordered   11/03/18 0758  Troponin I - Now Then Q6H  Now then every 6 hours,   STAT     11/03/18 0757   11/03/18 0630  Urine culture  Once,   STAT     11/03/18 0629   11/03/18 0621  Blood Culture (routine x 2)  BLOOD  CULTURE X 2,   STAT     11/03/18 6045   Signed and Held  Culture, blood (x 2)  BLOOD CULTURE X 2,   STAT    Comments:  INITIATE ANTIBIOTICS WITHIN 1 HOUR AFTER BLOOD CULTURES DRAWN.  If unable to obtain blood cultures, call MD immediately regarding antibiotic instructions.    Signed and Held   Signed and Held  CBC  ONCE - STAT,   STAT     Signed and Held   Signed and Held  Comprehensive metabolic panel  ONCE - STAT,   STAT     Signed and Held   Signed and Held  Lactic acid, plasma  STAT Now then every 3 hours,   STAT     Signed and Held   Signed and Held  Urinalysis, Routine w reflex microscopic  ONCE - STAT,   STAT     Signed and Held   Signed and Held  Cortisol  ONCE - STAT,   STAT     Signed and Held   Signed and Held  Troponin I - ONCE - STAT  ONCE - STAT,   STAT     Signed and Held   Signed and Held  Protime-INR  ONCE - STAT,   STAT     Signed and Held   Signed and Held  Procalcitonin  ONCE - STAT,   STAT     Signed and Held   Signed and Held  APTT  ONCE - STAT,   STAT     Signed and Held   Signed and Held  Type and screen [facility]  ONCE - STAT,   STAT    Comments:  [facility]    Signed and Held          Vitals/Pain Today's Vitals   11/03/18 0715 11/03/18 0730 11/03/18 0830 11/03/18 0900  BP:  (!) 117/55 (!) 110/49 (!) 113/51  Pulse:  (!) 111 (!) 117 (!) 110  Resp:  16 19 15   Temp:      TempSrc:      SpO2:  100% 98% 100%  Weight:      Height:      PainSc: Asleep       Isolation Precautions Droplet precaution  Medications Medications  acetaminophen (TYLENOL) 650 MG suppository (has no administration in time range)  heparin ADULT infusion 100  units/mL (25000 units/213mL sodium chloride 0.45%) (600 Units/hr Intravenous New Bag/Given 11/03/18 0810)  sodium chloride 0.9 % bolus 1,000 mL (0 mLs Intravenous Stopped 11/03/18 0812)    And  sodium chloride 0.9 % bolus 500 mL (0 mLs Intravenous Stopped 11/03/18 0814)     And  sodium chloride 0.9 % bolus 250 mL (0 mLs Intravenous Stopped 11/03/18 0813)  acetaminophen (TYLENOL) suppository 975 mg (975 mg Rectal Given 11/03/18 0722)  vancomycin (VANCOCIN) IVPB 1000 mg/200 mL premix ( Intravenous Stopped 11/03/18 0814)  piperacillin-tazobactam (ZOSYN) IVPB 3.375 g (0 g Intravenous Stopped 11/03/18 0740)  heparin injection 3,150 Units (3,150 Units Intravenous Given 11/03/18 0812)    Mobility Ambulatory at baseline

## 2018-11-03 NOTE — Progress Notes (Signed)
*  PRELIMINARY RESULTS* Echocardiogram 2D Echocardiogram has been performed.  Cheryl GulaJoan M Sandra Brents 11/03/2018, 1:16 PM

## 2018-11-03 NOTE — Progress Notes (Signed)
Patient with Lyrica 300mg  bid orderd. CrCl~34.  Max dose with CrCl 30-60 is 300mg  daily. Verbal order from MD to change to 150mg  bid.

## 2018-11-03 NOTE — Consult Note (Signed)
Pharmacy Antibiotic Note  Cheryl [name] is a 67 y.o. female admitted on 11/03/2018 with sepsis and possible aspiration PNA  Pharmacy has been consulted for Zosyn and Vancomycin dosing. Patient was given Zosyn 3.375 and Vancomycin 1g IV x 1 dose in ED>   Plan: Start Vancomycin 750 IV every 24 hours with 15 hour stack dosing.  Goal trough 15-20 mcg/mL. Calculated trough @ Css is 18. Trough level prior to 4th dose.   Start Zosyn 3.375 IV EI every 8 hours.   Kinetics: Ke: 0.033   Vd: 36.4   T1/2: 21  Height: 4\' 10"  (147.3 cm) Weight: 114 lb 10.2 oz (52 kg) IBW/kg (Calculated) : 40.9  Temp (24hrs), Avg:100.9 F (38.3 C), Min:98 F (36.7 C), Max:104.1 F (40.1 C)  Recent Labs  Lab 11/03/18 0621 11/03/18 0641 11/03/18 1015  WBC 13.2*  --  19.6*  CREATININE 1.12*  --   --   LATICACIDVEN  --  1.83  --     Estimated Creatinine Clearance: 33.9 mL/min (A) (by C-G formula based on SCr of 1.12 mg/dL (H)).    Allergies  Allergen Reactions  . Percocet [Oxycodone-Acetaminophen] Hives and Rash  . Aspirin Nausea And Vomiting and Other (See Comments)  . Codeine Nausea And Vomiting, Nausea Only and Other (See Comments)  . Propoxyphene Nausea Only and Other (See Comments)  . Sulfa Antibiotics Rash and Other (See Comments)    Antimicrobials this admission: 12/10 Zosyn  >>  12/10 Vancomycin  >>   Dose adjustments this admission:   Microbiology results: 12/10 BCx: pending 12/10 UCx: pending  12/10 MRSA PCR: negative   Thank you for allowing pharmacy to be a part of this patient's care.  Gardner CandleSheema M Ahria Slappey, PharmD, BCPS Clinical Pharmacist 11/03/2018 12:22 PM

## 2018-11-03 NOTE — Consult Note (Signed)
Name: Cheryl [name] MRN: [id number] DOB: 08-04-1951    ADMISSION DATE:  11/03/2018 CONSULTATION DATE:  11/03/18  REFERRING MD :  Dr. Manuella Ghazi  CHIEF COMPLAINT:  Shortness of Breath, Altered Mental Status  BRIEF PATIENT DESCRIPTION:  67 y.o. Female admitted with Acute Hypoxic Hypercapnic Respiratory Failure and  Sepsis secondary to HCAP vs Aspiration PNA, and Elevated Troponin (demand ischemia vs. NSTEMI) on Heparin drip.  Questionable coffee ground emesis at home, but none noted since admission.  SIGNIFICANT EVENTS  11/03/18>> Admission to [facility] ICU  STUDIES:  11/03/18 Echocardiogram>>  CULTURES: Blood x2 12/10>> Urine 12/10>> Influenza 12/10>>Negative Sputum 12/10>> Strep Pneumo urinary antigen 12/10>> Legionella urinary antigen 12/10>>  ANTIBIOTICS: Vancomycin 11/03/18>> Zosyn 11/03/18>> Cefepime 11/03/18>>12/10 Azithromycin 11/03/18>>12/10 Rocephin 11/03/18>>12/10  HISTORY OF PRESENT ILLNESS:   Cheryl [name] is a 67 y.o. Female with a PMH notable for COPD on home O2, aspiration pneumonitis, and CHF, who presents to [facility] ED on 11/12/18 with c/o shortness of breath and altered mental status.  Pt is lethargic and obtunded, therefore history is obtained from the pt's husband.  Her husband reports that she has a cough for approximately one week, that was productive of clear sputum that progressed to yellow sputum by the end of the week. She was in her usual state of health besides the cough until around 0000 on 11/03/18, where she developed shortness of breath, altered mental status, and had "black stuff" coming out of her nose.  Initial workup in the ED revealed  WBC 13.2, Lactic acid 1.83, BNP 58, Troponin 0.18, Creatinine 1.12, and ABG 7.38 / 55 / 73 / 32.5.  CXR with bibasilar infiltrates concerning for chronic changes/scarring verses recurrent /evolving infiltrates.  She received IVF and Rocephin and Azithromycin in ED.  She is being admitted to [facility] for treatment of Acute  Hypoxic Hypercapnic Respiratory Failure in setting of HCAP vs Aspiration PNA, Sepsis, and Elevated troponin (demand ischemia vs NSTEMI) requiring heparin drip.  PCCM is consulted for further management.  PAST MEDICAL HISTORY :   has a past medical history of Acute postoperative pain (04/14/2017), Allergy, Anemia, Anxiety, Arthritis, Aspiration pneumonitis (HCC) (11/24/2015), Asthma, CHF (congestive heart failure) ([place]), Chronic pain, COPD (chronic obstructive pulmonary disease) ([place]), Coronary artery disease, DVT (deep venous thrombosis) ([place]), Dyspnea, Dysrhythmia, Encephalopathy, GERD (gastroesophageal reflux disease), Headache, Hyperlipidemia, Hypertension, Kyphoscoliosis deformity of spine, Migraines, Neuropathy (2010), Osteoporosis, Oxygen deficiency, Peripheral vascular disease ([place]), Pneumonia, Pneumonia (11/19/2015), Pneumonia (10/2016), Pneumonia, Vitamin D deficiency, and Wears dentures.  has a past surgical history that includes Appendectomy; Spine surgery; Foot surgery (Bilateral); cardiac catherization (10/31/2009); abdomnal aortic stent (05/30/2008); Abdominal hysterectomy (1975); Cholecystectomy (1972); Cervical fusion (C5 - 6/C6-7); Appendectomy; Colonoscopy with propofol (N/A, 07/27/2015); Esophagogastroduodenoscopy (egd) with propofol (N/A, 07/27/2015); Esophagogastroduodenoscopy (egd) with propofol (N/A, 05/19/2018); Tonsillectomy; Back surgery; Cardiac catheterization; Vascular surgery; Cataract extraction w/PHACO (Left, 08/20/2018); Eye surgery; and Cataract extraction w/PHACO (Right, 09/23/2018). Prior to Admission medications   Medication Sig Start Date End Date Taking? Authorizing Provider  acetaminophen (TYLENOL) 325 MG tablet Take 2 tablets (650 mg total) by mouth every 6 (six) hours as needed for mild pain (or Fever >/= 101). 05/21/18  Yes Wieting, Richard, MD  albuterol (PROVENTIL) (2.5 MG/3ML) 0.083% nebulizer solution Take 3 mLs (2.5 mg total) by nebulization every 4 (four) hours as  needed for wheezing. 12/24/17  Yes Birdie Sons, MD  alendronate (FOSAMAX) 70 MG tablet Take 1 tablet (70 mg total) by mouth every 7 (seven) days. Take with a full glass of water  on an empty stomach. Patient taking differently: Take 70 mg by mouth every Monday. Take with a full glass of water on an empty stomach. 01/23/18  Yes Birdie Sons, MD  ALPRAZolam Duanne Moron) 0.5 MG tablet Take 0.5-1 tablets (0.25-0.5 mg total) by mouth at bedtime as needed for anxiety. 08/19/18  Yes Birdie Sons, MD  baclofen (LIORESAL) 10 MG tablet Take 1 tablet (10 mg total) by mouth 3 (three) times daily as needed for muscle spasms. 08/17/18  Yes Triplett, Cari B, FNP  benzonatate (TESSALON PERLES) 100 MG capsule Take 1 capsule (100 mg total) by mouth 3 (three) times daily as needed for cough. 09/15/18  Yes Birdie Sons, MD  celecoxib (CELEBREX) 100 MG capsule Take 1 capsule (100 mg total) by mouth 2 (two) times daily as needed. 09/28/18  Yes Birdie Sons, MD  clopidogrel (PLAVIX) 75 MG tablet Take 75 mg by mouth daily.   Yes [provider]  diphenoxylate-atropine (LOMOTIL) 2.5-0.025 MG tablet Take 1 tablet by mouth as needed for diarrhea or loose stools. 06/10/18  Yes [provider]  estradiol (ESTRACE) 0.5 MG tablet Take 1 tablet (0.5 mg total) by mouth every other day. 09/09/18  Yes Birdie Sons, MD  fentaNYL (DURAGESIC - DOSED MCG/HR) 50 MCG/HR Place 1 patch (50 mcg total) onto the skin every 3 (three) days. 11/29/18 12/29/18 Yes Vevelyn Francois, NP  ferrous sulfate 325 (65 FE) MG tablet Take 1 tablet (325 mg total) by mouth 2 (two) times daily with a meal. 09/23/18  Yes Birdie Sons, MD  Fluticasone-Salmeterol (ADVAIR DISKUS) 250-50 MCG/DOSE AEPB TAKE ONE PUFF TWICE DAILY. 09/28/18  Yes Birdie Sons, MD  memantine (NAMENDA) 5 MG tablet Take 5 mg by mouth 2 (two) times daily.   Yes [provider]  NARCAN 4 MG/0.1ML LIQD nasal spray kit Place 0.4 mg into the nose daily as  needed (for accidental overdose.).  09/11/17  Yes [provider]  ondansetron (ZOFRAN) 4 MG tablet Take 4 mg by mouth as needed. 08/04/18  Yes [provider]  pantoprazole (PROTONIX) 40 MG tablet Take 40 mg by mouth 2 (two) times daily. Take 1 tablet (40 mg) by mouth scheduled every morning, may repeat dose in evening if needed for heartburn/indigestion.   Yes [provider]  pregabalin (LYRICA) 300 MG capsule Take 300 mg by mouth 2 (two) times daily.   Yes [provider]  silver sulfADIAZINE (SILVADENE) 1 % cream Apply 1 application topically daily as needed (leg infection).    Yes [provider]  simvastatin (ZOCOR) 10 MG tablet Take 10 mg by mouth at bedtime.    Yes [provider]  Suvorexant (BELSOMRA) 5 MG TABS Take 1 tablet by mouth at bedtime as needed (for insomnia). Patient taking differently: Take 5 mg by mouth at bedtime.  07/31/18  Yes Birdie Sons, MD  tiotropium (SPIRIVA HANDIHALER) 18 MCG inhalation capsule inhale the contents of one capsule in the handihaler once daily Patient taking differently: Place 18 mcg into inhaler and inhale daily. inhale the contents of one capsule in the handihaler once daily 01/23/18  Yes Fisher, Kirstie Peri, MD  triamcinolone (KENALOG) 0.025 % ointment Apply 1 application topically 2 (two) times daily. Patient taking differently: Apply 1 application topically 2 (two) times daily as needed (cracking skin).  06/15/18  Yes Birdie Sons, MD  VENTOLIN HFA 108 [phone] Base) MCG/ACT inhaler Inhale 2 puffs into the lungs every 4 (four) hours as  needed for wheezing or shortness of breath. Patient taking differently: Inhale 2 puffs into the lungs every 4 (four) hours as needed for wheezing or shortness of breath.  08/21/18  Yes Birdie Sons, MD  vitamin B-12 (CYANOCOBALAMIN) 1000 MCG tablet Take 1 tablet (1,000 mcg total) by mouth daily. 05/21/18  Yes Wieting, Richard, MD  fentaNYL (DURAGESIC - DOSED MCG/HR) 50  MCG/HR Place 1 patch (50 mcg total) onto the skin every 3 (three) days. Patient not taking: Reported on 11/03/2018 01/28/19 02/27/19  Vevelyn Francois, NP  fentaNYL (DURAGESIC - DOSED MCG/HR) 50 MCG/HR Place 1 patch (50 mcg total) onto the skin every 3 (three) days. Patient not taking: Reported on 11/03/2018 12/29/18 01/28/19  Vevelyn Francois, NP   Allergies  Allergen Reactions  . Percocet [Oxycodone-Acetaminophen] Hives and Rash  . Aspirin Nausea And Vomiting and Other (See Comments)  . Codeine Nausea And Vomiting, Nausea Only and Other (See Comments)  . Propoxyphene Nausea Only and Other (See Comments)  . Sulfa Antibiotics Rash and Other (See Comments)    FAMILY HISTORY:  family history includes Arthritis in her mother; Cancer in her brother, brother, and mother; Diabetes in her brother, mother, and sister; Heart disease in her father and mother. SOCIAL HISTORY:  reports that she has been smoking cigarettes. She has a 50.00 pack-year smoking history. She has never used smokeless tobacco. She reports that she does not drink alcohol or use drugs.  REVIEW OF SYSTEMS:   Unable to obtain due to AMS and lethargy  SUBJECTIVE:  Unable to obtain due to AMS and lethargy Vital signs currently stable On 4L Hand  VITAL SIGNS: Temp:  [98 F (36.7 C)-104.1 F (40.1 C)] 98 F (36.7 C) (12/10 0958) Pulse Rate:  [105-130] 105 (12/10 0959) Resp:  [15-24] 15 (12/10 0959) BP: (79-152)/(38-99) 84/38 (12/10 0959) SpO2:  [83 %-100 %] 100 % (12/10 0959) Weight:  [52 kg-52.6 kg] 52 kg (12/10 0958)  PHYSICAL EXAMINATION: General:  Acute on chronically ill appearing female, laying in bed, on 4L [place], in NAD Neuro: Lethargic, arouses to pain, Pupils PERRLA 2 mm brisk bilaterally  HEENT:  Atraumatic, normocephalic, neck supple, no JVD Cardiovascular:  RRR, s1s2, no M/R/G, 2+ radial pulses bilaterally, 1+ pedal pulses bilaterally  Lungs:  Fine Crackles bilaterally, no wheezing, even, nonlabored, normal effort, no  assessory muscle use Abdomen:  Soft, nontender, nondistended, BS+ x4 Musculoskeletal:  No deformities, normal bulk and tone Skin:  Warm/dry.  No obvious rashes, lesion, or ulcerations  Recent Labs  Lab 11/03/18 0621  NA 140  K 4.1  CL 101  CO2 32  BUN 17  CREATININE 1.12*  GLUCOSE 109*   Recent Labs  Lab 11/03/18 0621  HGB 12.4  HCT 40.7  WBC 13.2*  PLT 205   Dg Chest Port 1 View  Result Date: 11/03/2018 CLINICAL DATA:  67 year old female with fever and cough. EXAM: PORTABLE CHEST 1 VIEW COMPARISON:  Chest radiograph dated 06/30/2018 FINDINGS: Bibasilar interstitial densities may represent chronic changes and scarring although recurrent infiltrate is not entirely excluded. Clinical correlation is recommended. There is no pleural effusion or pneumothorax. Stable mild cardiomegaly. No acute osseous pathology. IMPRESSION: Bibasilar chronic changes/scarring versus developing infiltrate. Electronically Signed   By: Anner Crete M.D.   On: 11/03/2018 06:38    ASSESSMENT / PLAN:  Acute Hypoxic Hypercapnic Respiratory Failure in setting of HCAP vs. Aspiration PNA Hx: COPD on home O2, Asthma,PNA, Aspiration pneumonitis -Supplemental O2 as needed to maintain O2 sats 88  to 94% -Follow intermittent CXR and ABG -BiPAP as needed -High risk for intubation -Change antibiotics to Vancomycin and Zosyn -Follow cultures as above -ID consulted, appreciate input -Bronchodilators  Shock (Septic +/- Cardiogenic) Elevated Troponin, Demand ischemia vs. NSTEMI Hx: CHF, HTN, HLD, DVT -Cardiac monitoring -Maintain MAP >65 -Received 2.75L NS boluses in ED -Will hold off on additional IVF for now given CHF hx and crackles upon auscultation post boluses -Neo-synephrine if needed to maintain MAP >65 -Follow cultures as above -Change Azithromycin and Rocephin to Vancomycin and Zosyn -Trend WBC's, Procalcitonin, and Lactic acid -Trend Troponin -Heparin drip -Cardiology consulted, appreciate  input -Echocardiogram pending  AKI -Monitor I&O's / urinary output -Follow BMP -Ensure adequate renal perfusion -Avoid nephrotoxic agents as able -Replace electrolytes as indicated  ? Coffee ground emesis, none reported since admission -NPO -Protonix IV BID -Monitor for s/sx of bleeding -Serial H&H q6h -Transfuse for Hgb < 8.0 -Consider GI consult if coffee ground emesis develops  Acute Metabolic Encephalopathy in setting of Sepsis -Provide supportive care -Avoid sedating meds as able -Obtain ABG -Consider Head CT    DISPOSITION: ICU GOALS OF CARE: Full Code VTE PROPHYLAXIS: Heparin drip UPDATES: Updated pt's , husband, sister and brother at bedside 11/03/18. Pt's husband reports that he wants the pt to be a Full Code for now.  He does report that both he and the pt would not want long term life support or trach/peg.   Darel Hong, AGACNP-BC [facility] Pager: [phone] Cell: [phone]  11/03/2018, 11:48 AM

## 2018-11-03 NOTE — Progress Notes (Signed)
ANTICOAGULATION CONSULT NOTE - Initial Consult  Pharmacy Consult for Heparin Drip Indication: chest pain/ACS  Allergies  Allergen Reactions  . Percocet [Oxycodone-Acetaminophen] Hives and Rash  . Aspirin Nausea And Vomiting and Other (See Comments)  . Codeine Nausea And Vomiting, Nausea Only and Other (See Comments)  . Propoxyphene Nausea Only and Other (See Comments)  . Sulfa Antibiotics Rash and Other (See Comments)    Patient Measurements: Height: 4\' 10"  (147.3 cm) Weight: 114 lb 10.2 oz (52 kg) IBW/kg (Calculated) : 40.9 Heparin Dosing Weight: 52.6 kg  Vital Signs: Temp: 99.4 F (37.4 C) (12/10 2000) Temp Source: Oral (12/10 2000) BP: 121/56 (12/10 2100) Pulse Rate: 98 (12/10 2100)  Labs: Recent Labs    11/03/18 0621 11/03/18 1015 11/03/18 1420 11/03/18 2133  HGB 12.4 10.1* 10.3* 9.6*  HCT 40.7 33.2* 33.8* 31.0*  PLT 205 185  --   --   APTT 33 92*  --   --   LABPROT 13.0 14.6  --   --   INR 0.99 1.15  --   --   HEPARINUNFRC  --   --  0.21* 0.24*  CREATININE 1.12* 0.97  --   --   TROPONINI 0.18* 1.19*  --  0.65*    Estimated Creatinine Clearance: 39.1 mL/min (by C-G formula based on SCr of 0.97 mg/dL).   Medical History: Past Medical History:  Diagnosis Date  . Acute postoperative pain 04/14/2017  . Allergy   . Anemia   . Anxiety   . Arthritis    fingers  . Aspiration pneumonitis (HCC) 11/24/2015  . Asthma   . CHF (congestive heart failure) (HCC)   . Chronic pain   . COPD (chronic obstructive pulmonary disease) (HCC)   . Coronary artery disease   . DVT (deep venous thrombosis) (HCC)   . Dyspnea   . Dysrhythmia   . Encephalopathy    MULTIPLE TIMES  . GERD (gastroesophageal reflux disease)   . Headache   . Hyperlipidemia   . Hypertension   . Kyphoscoliosis deformity of spine   . Migraines   . Neuropathy 2010  . Osteoporosis   . Oxygen deficiency   . Peripheral vascular disease (HCC)   . Pneumonia   . Pneumonia 11/19/2015  . Pneumonia  10/2016  . Pneumonia    aspiration 2018- 4 times in last year  . Vitamin D deficiency   . Wears dentures    full upper and lower    Medications:  Scheduled:  . estradiol  0.5 mg Oral QODAY  . ferrous sulfate  325 mg Oral BID WC  . memantine  5 mg Oral BID  . mometasone-formoterol  2 puff Inhalation BID  . pantoprazole (PROTONIX) IV  40 mg Intravenous Q12H  . pregabalin  150 mg Oral BID  . simvastatin  10 mg Oral QHS  . vitamin B-12  1,000 mcg Oral Daily   Infusions:  . heparin 800 Units/hr (11/03/18 2228)  . phenylephrine (NEO-SYNEPHRINE) Adult infusion    . piperacillin-tazobactam (ZOSYN)  IV 3.375 g (11/03/18 2132)  . sodium chloride     And  . sodium chloride     And  . sodium chloride    . sodium chloride    . [START ON 11/04/2018] vancomycin      Assessment: Pharmacy consulted to dose and monitor heparin drip for ACS/STEMI in this 67 yo female who presents to ED with fever and respiratory distress.   12/10 Heparin infusion started @ 600 units/hr   Goal  of Therapy:  Heparin level 0.3-0.7 units/ml Monitor platelets by anticoagulation protocol: Yes   Plan:  12/10 @  1500 HL 0.21. Level is subtherapeutic. Will order heparin 700 unit bolus and increase infusion to heparin 700 units/hr.  Recheck HL in 6 hours.  CBC and HL with AM labs per protocol.   12/10:  HL @ 21:30 = 0.24 Will increase heparin drip rate to 800 units/hr and recheck HL on 12/11 with AM labs.   Scherrie Gerlach, PharmD Clinical Pharmacist 11/03/2018 11:10 PM

## 2018-11-03 NOTE — ED Triage Notes (Addendum)
Pt to triage via w/c, appears uncomfortable; husband x wk having cough; since last night "black stuff" coming out of nose and having diff breathing; O2 at 3l/min via [place] as needed at home; pt is nonverbal, grunting only; husband st has been this way since midnight; O2 sat 83% on ra; O2 placed at 3l/min to bring sat to 92% and pt taken immed to room 1 for further eval; pt had to be lifted onto stretcher, unable to stand or follow commands; skin hot to touch; dressed in hosp gown & placed on card monitor; charge nurse and care nurse notified

## 2018-11-04 ENCOUNTER — Inpatient Hospital Stay: Payer: Medicare HMO

## 2018-11-04 LAB — BASIC METABOLIC PANEL
Anion gap: 5 (ref 5–15)
BUN: 11 mg/dL (ref 8–23)
CO2: 27 mmol/L (ref 22–32)
CREATININE: 0.81 mg/dL (ref 0.44–1.00)
Calcium: 7.6 mg/dL — ABNORMAL LOW (ref 8.9–10.3)
Chloride: 106 mmol/L (ref 98–111)
GFR calc non Af Amer: >60 mL/min (ref >60)
Glucose, Bld: 90 mg/dL (ref 70–99)
Potassium: 3.4 mmol/L — ABNORMAL LOW (ref 3.5–5.1)
Sodium: 138 mmol/L (ref 135–145)

## 2018-11-04 LAB — CBC
HCT: 30.9 % — ABNORMAL LOW (ref 36.0–46.0)
Hemoglobin: 9.7 g/dL — ABNORMAL LOW (ref 12.0–15.0)
MCH: 30.1 pg (ref 26.0–34.0)
MCHC: 31.4 g/dL (ref 30.0–36.0)
MCV: 96 fL (ref 80.0–100.0)
NRBC: 0 % (ref 0.0–0.2)
Platelets: 158 10*3/uL (ref 150–400)
RBC: 3.22 MIL/uL — ABNORMAL LOW (ref 3.87–5.11)
RDW: 14.7 % (ref 11.5–15.5)
WBC: 15.1 10*3/uL — ABNORMAL HIGH (ref 4.0–10.5)

## 2018-11-04 LAB — GLUCOSE, CAPILLARY
GLUCOSE-CAPILLARY: 86 mg/dL (ref 70–99)
Glucose-Capillary: 243 mg/dL — ABNORMAL HIGH (ref 70–99)
Glucose-Capillary: 80 mg/dL (ref 70–99)
Glucose-Capillary: 87 mg/dL (ref 70–99)
Glucose-Capillary: 89 mg/dL (ref 70–99)
Glucose-Capillary: 96 mg/dL (ref 70–99)

## 2018-11-04 LAB — HEPARIN LEVEL (UNFRACTIONATED)
HEPARIN UNFRACTIONATED: 0.2 [IU]/mL — AB (ref 0.30–0.70)
Heparin Unfractionated: 0.2 IU/mL — ABNORMAL LOW (ref 0.30–0.70)
Heparin Unfractionated: 0.26 IU/mL — ABNORMAL LOW (ref 0.30–0.70)
Heparin Unfractionated: 0.44 IU/mL (ref 0.30–0.70)

## 2018-11-04 LAB — BLOOD GAS, ARTERIAL
Acid-Base Excess: 3.3 mmol/L — ABNORMAL HIGH (ref 0.0–2.0)
Bicarbonate: 29.1 mmol/L — ABNORMAL HIGH (ref 20.0–28.0)
FIO2: 0.28
O2 SAT: 91.1 %
Patient temperature: 37
pCO2 arterial: 48 mmHg (ref 32.0–48.0)
pH, Arterial: 7.39 (ref 7.350–7.450)
pO2, Arterial: 62 mmHg — ABNORMAL LOW (ref 83.0–108.0)

## 2018-11-04 LAB — HEMOGLOBIN AND HEMATOCRIT, BLOOD
HCT: 30.3 % — ABNORMAL LOW (ref 36.0–46.0)
HCT: 30.9 % — ABNORMAL LOW (ref 36.0–46.0)
HCT: 30.6 % — ABNORMAL LOW (ref 36.0–46.0)
Hemoglobin: 9.3 g/dL — ABNORMAL LOW (ref 12.0–15.0)
Hemoglobin: 9.7 g/dL — ABNORMAL LOW (ref 12.0–15.0)
Hemoglobin: 9.7 g/dL — ABNORMAL LOW (ref 12.0–15.0)

## 2018-11-04 LAB — LEGIONELLA PNEUMOPHILA SEROGP 1 UR AG: L. pneumophila Serogp 1 Ur Ag: NEGATIVE

## 2018-11-04 LAB — PROCALCITONIN: Procalcitonin: 21.16 ng/mL

## 2018-11-04 LAB — URINE CULTURE: Culture: NO GROWTH

## 2018-11-04 MED ORDER — HEPARIN BOLUS VIA INFUSION
700.0000 [IU] | Freq: Once | INTRAVENOUS | Status: AC
  Administered 2018-11-04: 700 [IU] via INTRAVENOUS
  Filled 2018-11-04: qty 700

## 2018-11-04 MED ORDER — DEXTROSE 50 % IV SOLN
1.0000 | Freq: Once | INTRAVENOUS | Status: AC
  Administered 2018-11-04: 50 mL via INTRAVENOUS
  Filled 2018-11-04: qty 50

## 2018-11-04 NOTE — Progress Notes (Signed)
ANTICOAGULATION CONSULT NOTE  Pharmacy Consult for Heparin Drip Indication: chest pain/ACS  Allergies  Allergen Reactions  . Percocet [Oxycodone-Acetaminophen] Hives and Rash  . Aspirin Nausea And Vomiting and Other (See Comments)  . Codeine Nausea And Vomiting, Nausea Only and Other (See Comments)  . Propoxyphene Nausea Only and Other (See Comments)  . Sulfa Antibiotics Rash and Other (See Comments)    Patient Measurements: Height: 4\' 10"  (147.3 cm) Weight: 109 lb (49.4 kg) IBW/kg (Calculated) : 40.9 Heparin Dosing Weight: 52.6 kg  Vital Signs: Temp: 98.5 F (36.9 C) (12/11 2000) Temp Source: Oral (12/11 2000) BP: 119/50 (12/11 2236) Pulse Rate: 86 (12/11 2236)  Labs: Recent Labs    11/03/18 0621 11/03/18 1015  11/03/18 2133  11/04/18 0723 11/04/18 0827 11/04/18 1331 11/04/18 1543 11/04/18 1903 11/04/18 2247  HGB 12.4 10.1*   < > 9.6*   < > 9.7*  --  9.7*  --  9.7*  --   HCT 40.7 33.2*   < > 31.0*   < > 30.9*  --  30.9*  --  30.6*  --   PLT 205 185  --   --   --  158  --   --   --   --   --   APTT 33 92*  --   --   --   --   --   --   --   --   --   LABPROT 13.0 14.6  --   --   --   --   --   --   --   --   --   INR 0.99 1.15  --   --   --   --   --   --   --   --   --   HEPARINUNFRC  --   --    < > 0.24*  --  0.20* 0.20*  --  0.26*  --  0.44  CREATININE 1.12* 0.97  --   --   --  0.81  --   --   --   --   --   TROPONINI 0.18* 1.19*  --  0.65*  --   --   --   --   --   --   --    < > = values in this interval not displayed.    Estimated Creatinine Clearance: 45.8 mL/min (by C-G formula based on SCr of 0.81 mg/dL).   Medical History: Past Medical History:  Diagnosis Date  . Acute postoperative pain 04/14/2017  . Allergy   . Anemia   . Anxiety   . Arthritis    fingers  . Aspiration pneumonitis (HCC) 11/24/2015  . Asthma   . CHF (congestive heart failure) (HCC)   . Chronic pain   . COPD (chronic obstructive pulmonary disease) (HCC)   . Coronary artery  disease   . DVT (deep venous thrombosis) (HCC)   . Dyspnea   . Dysrhythmia   . Encephalopathy    MULTIPLE TIMES  . GERD (gastroesophageal reflux disease)   . Headache   . Hyperlipidemia   . Hypertension   . Kyphoscoliosis deformity of spine   . Migraines   . Neuropathy 2010  . Osteoporosis   . Oxygen deficiency   . Peripheral vascular disease (HCC)   . Pneumonia   . Pneumonia 11/19/2015  . Pneumonia 10/2016  . Pneumonia    aspiration 2018- 4 times in last year  . Vitamin D  deficiency   . Wears dentures    full upper and lower    Medications:  Scheduled:  . estradiol  0.5 mg Oral QODAY  . ferrous sulfate  325 mg Oral BID WC  . memantine  5 mg Oral BID  . mometasone-formoterol  2 puff Inhalation BID  . pantoprazole (PROTONIX) IV  40 mg Intravenous Q12H  . pregabalin  150 mg Oral BID  . simvastatin  10 mg Oral QHS  . vitamin B-12  1,000 mcg Oral Daily   Infusions:  . dextrose 50 mL/hr at 11/04/18 2200  . heparin 950 Units/hr (11/04/18 2200)  . phenylephrine (NEO-SYNEPHRINE) Adult infusion    . piperacillin-tazobactam (ZOSYN)  IV 3.375 g (11/04/18 2201)  . sodium chloride     And  . sodium chloride     And  . sodium chloride    . sodium chloride      Assessment: Pharmacy consulted to dose and monitor heparin drip for ACS/STEMI in this 67 yo female who presents to ED with fever and respiratory distress.   12/10 Heparin infusion started @ 600 units/hr  12/10 1500 HL: 0.21. Increased infusion to 700 u/hr 12/10 2130 HL: 0.24. Increased infusion to 800 u/hr  12/11 0800 HL: 0.20 12/11 1600 HL: 0.26  Goal of Therapy:  Heparin level 0.3-0.7 units/ml Monitor platelets by anticoagulation protocol: Yes   Plan:  12/11 1600 HL: 0.26. Level is still subtherapeutic. Will order heparin 700 unit bolus and increase infusion to 950 units/hr. Recheck HL on 6 hours after rate change. Hgb has steadied to 9.7, but will continue to monitor.   12/11: HL @ 2247 = 0.44 .     Will  continue pt on current rate and draw confirmation level on 12/12 with AM labs.   Scherrie Gerlachobbins,Ahnyla Mendel D, PharmD Clinical Pharmacist 11/04/2018 11:28 PM

## 2018-11-04 NOTE — Progress Notes (Signed)
ANTICOAGULATION CONSULT NOTE - Initial Consult  Pharmacy Consult for Heparin Drip Indication: chest pain/ACS  Allergies  Allergen Reactions  . Percocet [Oxycodone-Acetaminophen] Hives and Rash  . Aspirin Nausea And Vomiting and Other (See Comments)  . Codeine Nausea And Vomiting, Nausea Only and Other (See Comments)  . Propoxyphene Nausea Only and Other (See Comments)  . Sulfa Antibiotics Rash and Other (See Comments)    Patient Measurements: Height: 4\' 10"  (147.3 cm) Weight: 114 lb 10.2 oz (52 kg) IBW/kg (Calculated) : 40.9 Heparin Dosing Weight: 52.6 kg  Vital Signs: Temp: 99.4 F (37.4 C) (12/11 0800) Temp Source: Oral (12/11 0800) BP: 116/57 (12/11 0800) Pulse Rate: 92 (12/11 0800)  Labs: Recent Labs    11/03/18 0621 11/03/18 1015 11/03/18 1420 11/03/18 2133 11/04/18 0131 11/04/18 0723  HGB 12.4 10.1* 10.3* 9.6* 9.3*  --   HCT 40.7 33.2* 33.8* 31.0* 30.3*  --   PLT 205 185  --   --   --   --   APTT 33 92*  --   --   --   --   LABPROT 13.0 14.6  --   --   --   --   INR 0.99 1.15  --   --   --   --   HEPARINUNFRC  --   --  0.21* 0.24*  --  0.20*  CREATININE 1.12* 0.97  --   --   --  0.81  TROPONINI 0.18* 1.19*  --  0.65*  --   --     Estimated Creatinine Clearance: 46.9 mL/min (by C-G formula based on SCr of 0.81 mg/dL).   Medical History: Past Medical History:  Diagnosis Date  . Acute postoperative pain 04/14/2017  . Allergy   . Anemia   . Anxiety   . Arthritis    fingers  . Aspiration pneumonitis (HCC) 11/24/2015  . Asthma   . CHF (congestive heart failure) (HCC)   . Chronic pain   . COPD (chronic obstructive pulmonary disease) (HCC)   . Coronary artery disease   . DVT (deep venous thrombosis) (HCC)   . Dyspnea   . Dysrhythmia   . Encephalopathy    MULTIPLE TIMES  . GERD (gastroesophageal reflux disease)   . Headache   . Hyperlipidemia   . Hypertension   . Kyphoscoliosis deformity of spine   . Migraines   . Neuropathy 2010  .  Osteoporosis   . Oxygen deficiency   . Peripheral vascular disease (HCC)   . Pneumonia   . Pneumonia 11/19/2015  . Pneumonia 10/2016  . Pneumonia    aspiration 2018- 4 times in last year  . Vitamin D deficiency   . Wears dentures    full upper and lower    Medications:  Scheduled:  . estradiol  0.5 mg Oral QODAY  . ferrous sulfate  325 mg Oral BID WC  . memantine  5 mg Oral BID  . mometasone-formoterol  2 puff Inhalation BID  . pantoprazole (PROTONIX) IV  40 mg Intravenous Q12H  . pregabalin  150 mg Oral BID  . simvastatin  10 mg Oral QHS  . vitamin B-12  1,000 mcg Oral Daily   Infusions:  . dextrose 50 mL/hr at 11/04/18 0600  . heparin 800 Units/hr (11/04/18 0600)  . phenylephrine (NEO-SYNEPHRINE) Adult infusion    . piperacillin-tazobactam (ZOSYN)  IV 3.375 g (11/04/18 0507)  . sodium chloride     And  . sodium chloride     And  .  sodium chloride    . sodium chloride    . vancomycin 750 mg (11/04/18 0021)    Assessment: Pharmacy consulted to dose and monitor heparin drip for ACS/STEMI in this 67 yo female who presents to ED with fever and respiratory distress.   12/10 Heparin infusion started @ 600 units/hr  12/10 1500 HL: 0.21. Increased infusion to 700 u/hr 12/10 2130 HL: 0.24. Increased infusion to 800 u/hr  12/11 0800 HL: 0.20  Goal of Therapy:  Heparin level 0.3-0.7 units/ml Monitor platelets by anticoagulation protocol: Yes   Plan:  12/11 0800 HL: 0.20. Level is still subtherapeutic. Will order heparin 700 unit bolus and increase infusion to 900 units/hr. Recheck HL on 6 hours after rate change. Hgb has dropped from 12.4 to 9.3, will continue to monitor.   Gardner Candle, PharmD, BCPS Clinical Pharmacist 11/04/2018 8:08 AM

## 2018-11-04 NOTE — Progress Notes (Signed)
Patient ID: Cheryl MillinMary G Vineyard [name], female   DOB: [birth year], 67 y.o.   MRN: [id number] Patient has improved mentation wise.  She was discussed during multidisciplinary rounds.  She has been hemodynamically stable.  Awaiting cultures.  She has had no respiratory distress.  Continue supportive care.  Patient is on telemetry status only being boarded in stepdown/ICU due to lack of beds.  PCCM will be available as needed.  [name]. Laura Gonzalez, MD [facility]

## 2018-11-04 NOTE — Progress Notes (Signed)
[facility] INFECTIOUS DISEASE PROGRESS NOTE Date of Admission:  11/03/2018     ID: Cheryl [name] is a 67 y.o. female with sepsis, likely PNA Active Problems:   Shortness of breath   Sepsis (HCC)   Fever   Respiratory distress   NSTEMI (non-ST elevated myocardial infarction) (HCC)   Subjective: No further fevers, husband at bedside and says she has been resting comfortable but some productive cough.   ROS  Unable to obtain  Medications:  Antibiotics Given (last 72 hours)    Date/Time Action Medication Dose Rate   11/03/18 0710 New Bag/Given   piperacillin-tazobactam (ZOSYN) IVPB 3.375 g 3.375 g 100 mL/hr   11/03/18 0713 New Bag/Given   vancomycin (VANCOCIN) IVPB 1000 mg/200 mL premix 1,000 mg 200 mL/hr   11/03/18 1420 New Bag/Given   piperacillin-tazobactam (ZOSYN) IVPB 3.375 g 3.375 g 12.5 mL/hr   11/03/18 2132 New Bag/Given   piperacillin-tazobactam (ZOSYN) IVPB 3.375 g 3.375 g 12.5 mL/hr   11/04/18 0021 New Bag/Given   vancomycin (VANCOCIN) IVPB 750 mg/150 ml premix 750 mg 150 mL/hr   11/04/18 0507 New Bag/Given   piperacillin-tazobactam (ZOSYN) IVPB 3.375 g 3.375 g 12.5 mL/hr     . estradiol  0.5 mg Oral QODAY  . ferrous sulfate  325 mg Oral BID WC  . memantine  5 mg Oral BID  . mometasone-formoterol  2 puff Inhalation BID  . pantoprazole (PROTONIX) IV  40 mg Intravenous Q12H  . pregabalin  150 mg Oral BID  . simvastatin  10 mg Oral QHS  . vitamin B-12  1,000 mcg Oral Daily    Objective: Vital signs in last 24 hours: Temp:  [98.8 F (37.1 C)-99.7 F (37.6 C)] 99.4 F (37.4 C) (12/11 0800) Pulse Rate:  [73-102] 96 (12/11 1200) Resp:  [12-26] 22 (12/11 1200) BP: (86-133)/(38-63) 125/63 (12/11 1200) SpO2:  [91 %-100 %] 97 % (12/11 1200) Constitutional:  Very thin, chronically ill appearing, arousable but sleepy HENT: [place]/AT, PERRLA, no scleral icterus Mouth/Throat: Oropharynx is clear and moist. No oropharyngeal exudate.  Cardiovascular: Normal rate,  regular rhythm and normal heart sounds. 1/6 sm Pulmonary/Chest: bil rhonchi, poor air movement Neck = supple, no nuchal rigidity Abdominal: Soft. Bowel sounds are normal.  exhibits no distension. There is no tenderness.  Lymphadenopathy: no cervical adenopathy. No axillary adenopathy Neurological: arousable but sleepy Skin: Skin is warm and dry. No rash noted. No erythema.  Psychiatric: lethargic  Lab Results Recent Labs    11/03/18 1015  11/04/18 0131 11/04/18 0723  WBC 19.6*  --   --  15.1*  HGB 10.1*   < > 9.3* 9.7*  HCT 33.2*   < > 30.3* 30.9*  NA 141  --   --  138  K 3.5  --   --  3.4*  CL 107  --   --  106  CO2 28  --   --  27  BUN 15  --   --  11  CREATININE 0.97  --   --  0.81   < > = values in this interval not displayed.    Microbiology: @micro @ Studies/Results: Dg Chest Port 1 View  Result Date: 11/04/2018 CLINICAL DATA:  Acute respiratory failure. EXAM: PORTABLE CHEST 1 VIEW COMPARISON:  Radiograph of November 03, 2018. FINDINGS: Stable cardiomediastinal silhouette. No pneumothorax or pleural effusion is noted. Minimal bibasilar subsegmental atelectasis is noted. Bony thorax is unremarkable. IMPRESSION: Minimal bibasilar subsegmental atelectasis. Electronically Signed   By: Lupita Raider, M.D.  On: 11/04/2018 07:52   Dg Chest Port 1 View  Result Date: 11/03/2018 CLINICAL DATA:  67 year old female with fever and cough. EXAM: PORTABLE CHEST 1 VIEW COMPARISON:  Chest radiograph dated 06/30/2018 FINDINGS: Bibasilar interstitial densities may represent chronic changes and scarring although recurrent infiltrate is not entirely excluded. Clinical correlation is recommended. There is no pleural effusion or pneumothorax. Stable mild cardiomegaly. No acute osseous pathology. IMPRESSION: Bibasilar chronic changes/scarring versus developing infiltrate. Electronically Signed   By: Elgie CollardArash  Radparvar M.D.   On: 11/03/2018 06:38    Assessment/Plan: Cheryl [name] is a 67 y.o.  female with multiple medical problems including COPD on home oxygen admitted with confusion and acute respiratory distress.  She was febrile to 104.  She has a hx of recurrent aspiration per her husband as well.  She was started on IV vancomycin and Zosyn and admitted to the intensive care unit. MRSA PCR negative. Flu PCR neg, UA negative. BCX pending. Sputum cx pending. CXR with chronic changes but possible developing infiltrate. Currently in the unit O2 by nasal cannula.  Not on pressors. FU CXR no change. Recommendations DC vancomycin as MRSA PCR negative. Cont Zosyn for possible aspiration and healthcare associated pneumonia. If sputum cultures unrevealing can change to oral augmentin for a total abx duration of 7 days. Thank you very much for the consult. Will follow with you.  Mick SellDavid P Joory Gough   11/04/2018, 12:55 PM

## 2018-11-04 NOTE — Progress Notes (Signed)
Pt transferred from ICU, A&O x3, on a heparin gtt. And Dextrose 5%. Pt oriented to room and denies any pain at this time. Will continue to monitor.

## 2018-11-04 NOTE — Progress Notes (Signed)
1        [facility] [place] [facility]   PATIENT NAME: Cheryl [name]    MR#:  [id number]  DATE OF BIRTH:  04-16-1951  SUBJECTIVE:  CHIEF COMPLAINT:   Chief Complaint  Patient presents with  . Shortness of Breath  more awake. Family at bedside REVIEW OF SYSTEMS:  ROS unable to provide due to her mental status  DRUG ALLERGIES:   Allergies  Allergen Reactions  . Percocet [Oxycodone-Acetaminophen] Hives and Rash  . Aspirin Nausea And Vomiting and Other (See Comments)  . Codeine Nausea And Vomiting, Nausea Only and Other (See Comments)  . Propoxyphene Nausea Only and Other (See Comments)  . Sulfa Antibiotics Rash and Other (See Comments)   VITALS:  Blood pressure 123/65, pulse 97, temperature 99.4 F (37.4 C), temperature source Oral, resp. rate (!) 23, height 4\' 10"  (1.473 m), weight 52 kg, SpO2 96 %. PHYSICAL EXAMINATION:  Physical Exam  Constitutional: She appears lethargic.  HENT:  Head: Normocephalic and atraumatic.  Eyes: Pupils are equal, round, and reactive to light. Conjunctivae and EOM are normal.  Neck: Normal range of motion. Neck supple. No tracheal deviation present. No thyromegaly present.  Cardiovascular: Normal rate, regular rhythm and normal heart sounds.  Pulmonary/Chest: Effort normal. No respiratory distress. She has decreased breath sounds. She has no wheezes. She has rhonchi. She exhibits no tenderness.  Abdominal: Soft. Bowel sounds are normal. She exhibits no distension. There is no tenderness.  Musculoskeletal: Normal range of motion.  Neurological: She appears lethargic. No cranial nerve deficit.  Skin: Skin is warm and dry. No rash noted.   LABORATORY PANEL:  Female CBC Recent Labs  Lab 11/04/18 0723 11/04/18 1331  WBC 15.1*  --   HGB 9.7* 9.7*  HCT 30.9* 30.9*  PLT 158  --    ------------------------------------------------------------------------------------------------------------------ Chemistries  Recent Labs    Lab 11/03/18 1015 11/04/18 0723  NA 141 138  K 3.5 3.4*  CL 107 106  CO2 28 27  GLUCOSE 88 90  BUN 15 11  CREATININE 0.97 0.81  CALCIUM 7.6* 7.6*  AST 26  --   ALT 11  --   ALKPHOS 51  --   BILITOT 0.6  --    RADIOLOGY:  Dg Chest Port 1 View  Result Date: 11/04/2018 CLINICAL DATA:  Acute respiratory failure. EXAM: PORTABLE CHEST 1 VIEW COMPARISON:  Radiograph of November 03, 2018. FINDINGS: Stable cardiomediastinal silhouette. No pneumothorax or pleural effusion is noted. Minimal bibasilar subsegmental atelectasis is noted. Bony thorax is unremarkable. IMPRESSION: Minimal bibasilar subsegmental atelectasis. Electronically Signed   By: Lupita RaiderJames  Green Jr, M.D.   On: 11/04/2018 07:52   ASSESSMENT AND PLAN:  [age] y f with sepsis  * Sepsis - present on admission due to pna, improving - sepsis protocol -Appreciate ID & PCCM input  * Pneumonia - DC vancomycin as MRSA PCR negative. - Cont Zosyn for possible aspiration and healthcare associated pneumonia per ID - If sputum cultures unrevealing can change to oral augmentin for a total abx duration of 7 days.  * Elevated troponins - due to supply demand ischemia from sepsis - serial troponins - Cardiology following  * Acute metabolic encephalopathy - likely due to sepsis - hold off Benzo and Narcotics      All the records are reviewed and case discussed with Care Management/Social Worker. Management plans discussed with the patient, nursing and they are in agreement.  CODE STATUS: Full Code  TOTAL TIME TAKING CARE  OF THIS PATIENT: 15 minutes.   More than 50% of the time was spent in counseling/coordination of care: YES  POSSIBLE D/C IN 2-3 DAYS, DEPENDING ON CLINICAL CONDITION.   Delfino Lovett M.D on 11/04/2018 at 4:00 PM  Between 7am to 6pm - Pager - [phone]  After 6pm go to [email or web address] - Social research officer, government  [facility]  Office  [phone]  CC: Primary care physician;  Malva Limes, MD  Note: This dictation was prepared with Dragon dictation along with smaller phrase technology. Any transcriptional errors that result from this process are unintentional.

## 2018-11-04 NOTE — Progress Notes (Signed)
ANTICOAGULATION CONSULT NOTE  Pharmacy Consult for Heparin Drip Indication: chest pain/ACS  Allergies  Allergen Reactions  . Percocet [Oxycodone-Acetaminophen] Hives and Rash  . Aspirin Nausea And Vomiting and Other (See Comments)  . Codeine Nausea And Vomiting, Nausea Only and Other (See Comments)  . Propoxyphene Nausea Only and Other (See Comments)  . Sulfa Antibiotics Rash and Other (See Comments)    Patient Measurements: Height: 4\' 10"  (147.3 cm) Weight: 114 lb 10.2 oz (52 kg) IBW/kg (Calculated) : 40.9 Heparin Dosing Weight: 52.6 kg  Vital Signs: Temp: 99.4 F (37.4 C) (12/11 0800) Temp Source: Oral (12/11 0800) BP: 128/60 (12/11 1600) Pulse Rate: 95 (12/11 1600)  Labs: Recent Labs    11/03/18 0621 11/03/18 1015  11/03/18 2133 11/04/18 0131 11/04/18 0723 11/04/18 0827 11/04/18 1331 11/04/18 1543  HGB 12.4 10.1*   < > 9.6* 9.3* 9.7*  --  9.7*  --   HCT 40.7 33.2*   < > 31.0* 30.3* 30.9*  --  30.9*  --   PLT 205 185  --   --   --  158  --   --   --   APTT 33 92*  --   --   --   --   --   --   --   LABPROT 13.0 14.6  --   --   --   --   --   --   --   INR 0.99 1.15  --   --   --   --   --   --   --   HEPARINUNFRC  --   --    < > 0.24*  --  0.20* 0.20*  --  0.26*  CREATININE 1.12* 0.97  --   --   --  0.81  --   --   --   TROPONINI 0.18* 1.19*  --  0.65*  --   --   --   --   --    < > = values in this interval not displayed.    Estimated Creatinine Clearance: 46.9 mL/min (by C-G formula based on SCr of 0.81 mg/dL).   Medical History: Past Medical History:  Diagnosis Date  . Acute postoperative pain 04/14/2017  . Allergy   . Anemia   . Anxiety   . Arthritis    fingers  . Aspiration pneumonitis (HCC) 11/24/2015  . Asthma   . CHF (congestive heart failure) (HCC)   . Chronic pain   . COPD (chronic obstructive pulmonary disease) (HCC)   . Coronary artery disease   . DVT (deep venous thrombosis) (HCC)   . Dyspnea   . Dysrhythmia   . Encephalopathy    MULTIPLE TIMES  . GERD (gastroesophageal reflux disease)   . Headache   . Hyperlipidemia   . Hypertension   . Kyphoscoliosis deformity of spine   . Migraines   . Neuropathy 2010  . Osteoporosis   . Oxygen deficiency   . Peripheral vascular disease (HCC)   . Pneumonia   . Pneumonia 11/19/2015  . Pneumonia 10/2016  . Pneumonia    aspiration 2018- 4 times in last year  . Vitamin D deficiency   . Wears dentures    full upper and lower    Medications:  Scheduled:  . estradiol  0.5 mg Oral QODAY  . ferrous sulfate  325 mg Oral BID WC  . memantine  5 mg Oral BID  . mometasone-formoterol  2 puff Inhalation BID  . pantoprazole (  PROTONIX) IV  40 mg Intravenous Q12H  . pregabalin  150 mg Oral BID  . simvastatin  10 mg Oral QHS  . vitamin B-12  1,000 mcg Oral Daily   Infusions:  . dextrose 50 mL/hr at 11/04/18 0600  . heparin 900 Units/hr (11/04/18 1600)  . phenylephrine (NEO-SYNEPHRINE) Adult infusion    . piperacillin-tazobactam (ZOSYN)  IV 12.5 mL/hr at 11/04/18 1600  . sodium chloride     And  . sodium chloride     And  . sodium chloride    . sodium chloride      Assessment: Pharmacy consulted to dose and monitor heparin drip for ACS/STEMI in this 67 yo female who presents to ED with fever and respiratory distress.   12/10 Heparin infusion started @ 600 units/hr  12/10 1500 HL: 0.21. Increased infusion to 700 u/hr 12/10 2130 HL: 0.24. Increased infusion to 800 u/hr  12/11 0800 HL: 0.20 12/11 1600 HL: 0.26  Goal of Therapy:  Heparin level 0.3-0.7 units/ml Monitor platelets by anticoagulation protocol: Yes   Plan:  12/11 1600 HL: 0.26. Level is still subtherapeutic. Will order heparin 700 unit bolus and increase infusion to 950 units/hr. Recheck HL on 6 hours after rate change. Hgb has steadied to 9.7, but will continue to monitor.   Albina Billet, PharmD, BCPS Clinical Pharmacist 11/04/2018 4:25 PM

## 2018-11-05 ENCOUNTER — Ambulatory Visit: Payer: Medicare HMO | Admitting: Pain Medicine

## 2018-11-05 LAB — HEMOGLOBIN AND HEMATOCRIT, BLOOD
HCT: 29.7 % — ABNORMAL LOW (ref 36.0–46.0)
HEMATOCRIT: 30.7 % — AB (ref 36.0–46.0)
HEMOGLOBIN: 9.8 g/dL — AB (ref 12.0–15.0)
Hemoglobin: 9.9 g/dL — ABNORMAL LOW (ref 12.0–15.0)

## 2018-11-05 LAB — PROCALCITONIN: Procalcitonin: 12.28 ng/mL

## 2018-11-05 LAB — HEPARIN LEVEL (UNFRACTIONATED): Heparin Unfractionated: 0.28 IU/mL — ABNORMAL LOW (ref 0.30–0.70)

## 2018-11-05 LAB — GLUCOSE, CAPILLARY
GLUCOSE-CAPILLARY: 100 mg/dL — AB (ref 70–99)
Glucose-Capillary: 103 mg/dL — ABNORMAL HIGH (ref 70–99)
Glucose-Capillary: 112 mg/dL — ABNORMAL HIGH (ref 70–99)
Glucose-Capillary: 111 mg/dL — ABNORMAL HIGH (ref 70–99)
Glucose-Capillary: 77 mg/dL (ref 70–99)
Glucose-Capillary: 80 mg/dL (ref 70–99)

## 2018-11-05 LAB — MAGNESIUM: Magnesium: 2 mg/dL (ref 1.7–2.4)

## 2018-11-05 MED ORDER — AMOXICILLIN-POT CLAVULANATE 875-125 MG PO TABS
1.0000 | ORAL_TABLET | Freq: Two times a day (BID) | ORAL | Status: DC
  Administered 2018-11-05 – 2018-11-08 (×6): 1 via ORAL
  Filled 2018-11-05 (×6): qty 1

## 2018-11-05 MED ORDER — PANTOPRAZOLE SODIUM 40 MG PO TBEC
40.0000 mg | DELAYED_RELEASE_TABLET | Freq: Every day | ORAL | Status: DC
  Administered 2018-11-05 – 2018-11-08 (×4): 40 mg via ORAL
  Filled 2018-11-05 (×4): qty 1

## 2018-11-05 MED ORDER — FENTANYL 12 MCG/HR TD PT72
12.5000 ug | MEDICATED_PATCH | TRANSDERMAL | Status: DC
  Administered 2018-11-05 – 2018-11-08 (×2): 12.5 ug via TRANSDERMAL
  Filled 2018-11-05 (×2): qty 1

## 2018-11-05 MED ORDER — CLOPIDOGREL BISULFATE 75 MG PO TABS
75.0000 mg | ORAL_TABLET | Freq: Every day | ORAL | Status: DC
  Administered 2018-11-05 – 2018-11-08 (×4): 75 mg via ORAL
  Filled 2018-11-05 (×4): qty 1

## 2018-11-05 MED ORDER — HEPARIN BOLUS VIA INFUSION
800.0000 [IU] | Freq: Once | INTRAVENOUS | Status: AC
  Administered 2018-11-05: 800 [IU] via INTRAVENOUS
  Filled 2018-11-05: qty 800

## 2018-11-05 MED ORDER — ENOXAPARIN SODIUM 40 MG/0.4ML ~~LOC~~ SOLN
40.0000 mg | SUBCUTANEOUS | Status: DC
  Administered 2018-11-05 – 2018-11-07 (×3): 40 mg via SUBCUTANEOUS
  Filled 2018-11-05 (×3): qty 0.4

## 2018-11-05 NOTE — Progress Notes (Signed)
This RN and NT attempted to get pt OOB.  1 max assist to help patient to side of bed.  Patient states that she cannot get up to chair.  When this RN asked spouse how she moved at home, he states that she moves around quite well at home.  This RN explained the importance of getting OOB and moving around but patient still states that she is not strong enough to get OOB at the moment.  Fentanyl patch placed on patient mid lower back.  Patient states that she will try again tomorrow.

## 2018-11-05 NOTE — Progress Notes (Signed)
ANTICOAGULATION CONSULT NOTE  Pharmacy Consult for Heparin Drip Indication: chest pain/ACS  Allergies  Allergen Reactions  . Percocet [Oxycodone-Acetaminophen] Hives and Rash  . Aspirin Nausea And Vomiting and Other (See Comments)  . Codeine Nausea And Vomiting, Nausea Only and Other (See Comments)  . Propoxyphene Nausea Only and Other (See Comments)  . Sulfa Antibiotics Rash and Other (See Comments)    Patient Measurements: Height: 4\' 10"  (147.3 cm) Weight: 109 lb (49.4 kg) IBW/kg (Calculated) : 40.9 Heparin Dosing Weight: 52.6 kg  Vital Signs: Temp: 97.8 F (36.6 C) (12/12 0443) Temp Source: Oral (12/12 0443) BP: 138/65 (12/12 0443) Pulse Rate: 84 (12/12 0443)  Labs: Recent Labs    11/03/18 0621 11/03/18 1015  11/03/18 2133  11/04/18 0723  11/04/18 1543 11/04/18 1903 11/04/18 2247 11/05/18 0123 11/05/18 0550  HGB 12.4 10.1*   < > 9.6*   < > 9.7*   < >  --  9.7*  --  9.9* 9.8*  HCT 40.7 33.2*   < > 31.0*   < > 30.9*   < >  --  30.6*  --  30.7* 29.7*  PLT 205 185  --   --   --  158  --   --   --   --   --   --   APTT 33 92*  --   --   --   --   --   --   --   --   --   --   LABPROT 13.0 14.6  --   --   --   --   --   --   --   --   --   --   INR 0.99 1.15  --   --   --   --   --   --   --   --   --   --   HEPARINUNFRC  --   --    < > 0.24*  --  0.20*   < > 0.26*  --  0.44  --  0.28*  CREATININE 1.12* 0.97  --   --   --  0.81  --   --   --   --   --   --   TROPONINI 0.18* 1.19*  --  0.65*  --   --   --   --   --   --   --   --    < > = values in this interval not displayed.    Estimated Creatinine Clearance: 45.8 mL/min (by C-G formula based on SCr of 0.81 mg/dL).   Medical History: Past Medical History:  Diagnosis Date  . Acute postoperative pain 04/14/2017  . Allergy   . Anemia   . Anxiety   . Arthritis    fingers  . Aspiration pneumonitis (HCC) 11/24/2015  . Asthma   . CHF (congestive heart failure) (HCC)   . Chronic pain   . COPD (chronic  obstructive pulmonary disease) (HCC)   . Coronary artery disease   . DVT (deep venous thrombosis) (HCC)   . Dyspnea   . Dysrhythmia   . Encephalopathy    MULTIPLE TIMES  . GERD (gastroesophageal reflux disease)   . Headache   . Hyperlipidemia   . Hypertension   . Kyphoscoliosis deformity of spine   . Migraines   . Neuropathy 2010  . Osteoporosis   . Oxygen deficiency   . Peripheral vascular disease (HCC)   .  Pneumonia   . Pneumonia 11/19/2015  . Pneumonia 10/2016  . Pneumonia    aspiration 2018- 4 times in last year  . Vitamin D deficiency   . Wears dentures    full upper and lower    Medications:  Scheduled:  . estradiol  0.5 mg Oral QODAY  . ferrous sulfate  325 mg Oral BID WC  . heparin  800 Units Intravenous Once  . memantine  5 mg Oral BID  . mometasone-formoterol  2 puff Inhalation BID  . pantoprazole (PROTONIX) IV  40 mg Intravenous Q12H  . pregabalin  150 mg Oral BID  . simvastatin  10 mg Oral QHS  . vitamin B-12  1,000 mcg Oral Daily   Infusions:  . dextrose 50 mL/hr at 11/04/18 2200  . heparin 950 Units/hr (11/04/18 2200)  . phenylephrine (NEO-SYNEPHRINE) Adult infusion    . piperacillin-tazobactam (ZOSYN)  IV 3.375 g (11/05/18 0523)  . sodium chloride     And  . sodium chloride     And  . sodium chloride    . sodium chloride      Assessment: Pharmacy consulted to dose and monitor heparin drip for ACS/STEMI in this 67 yo female who presents to ED with fever and respiratory distress.   12/10 Heparin infusion started @ 600 units/hr  12/10 1500 HL: 0.21. Increased infusion to 700 u/hr 12/10 2130 HL: 0.24. Increased infusion to 800 u/hr  12/11 0800 HL: 0.20 12/11 1600 HL: 0.26  Goal of Therapy:  Heparin level 0.3-0.7 units/ml Monitor platelets by anticoagulation protocol: Yes   Plan:  12/11 1600 HL: 0.26. Level is still subtherapeutic. Will order heparin 700 unit bolus and increase infusion to 950 units/hr. Recheck HL on 6 hours after rate  change. Hgb has steadied to 9.7, but will continue to monitor.   12/11: HL @ 2247 = 0.44 .     Will continue pt on current rate and draw confirmation level on 12/12 with AM labs.   12/12: HL @ 0600 = 0.28  Will order Heparin 800 units IV X 1 bolus and increase drip rate 1050 units/hr.  Will recheck HL 6 hrs after rate change.   Scherrie Gerlach, PharmD Clinical Pharmacist 11/05/2018 6:54 AM

## 2018-11-05 NOTE — Progress Notes (Signed)
MD Sheryle Hailiamond made aware that pt had 24 beats run of V-tach. A magnesium level was ordered. Pt remained asymptomatic. Will continue to monitor.

## 2018-11-05 NOTE — Progress Notes (Addendum)
[facility] INFECTIOUS DISEASE PROGRESS NOTE Date of Admission:  11/03/2018     ID: Cheryl [name] is a 67 y.o. female with sepsis, likely PNA Active Problems:   Shortness of breath   Sepsis (HCC)   Fever   Respiratory distress   NSTEMI (non-ST elevated myocardial infarction) (HCC)   Subjective: No further fevers, sister at bedside and says she has been resting comfortably. More alert today ROS  Unable to obtain  Medications:  Antibiotics Given (last 72 hours)    Date/Time Action Medication Dose Rate   11/03/18 0710 New Bag/Given   piperacillin-tazobactam (ZOSYN) IVPB 3.375 g 3.375 g 100 mL/hr   11/03/18 0713 New Bag/Given   vancomycin (VANCOCIN) IVPB 1000 mg/200 mL premix 1,000 mg 200 mL/hr   11/03/18 1420 New Bag/Given   piperacillin-tazobactam (ZOSYN) IVPB 3.375 g 3.375 g 12.5 mL/hr   11/03/18 2132 New Bag/Given   piperacillin-tazobactam (ZOSYN) IVPB 3.375 g 3.375 g 12.5 mL/hr   11/04/18 0021 New Bag/Given   vancomycin (VANCOCIN) IVPB 750 mg/150 ml premix 750 mg 150 mL/hr   11/04/18 0507 New Bag/Given   piperacillin-tazobactam (ZOSYN) IVPB 3.375 g 3.375 g 12.5 mL/hr   11/04/18 1432 New Bag/Given   piperacillin-tazobactam (ZOSYN) IVPB 3.375 g 3.375 g 12.5 mL/hr   11/04/18 2201 New Bag/Given   piperacillin-tazobactam (ZOSYN) IVPB 3.375 g 3.375 g 12.5 mL/hr   11/05/18 0523 New Bag/Given   piperacillin-tazobactam (ZOSYN) IVPB 3.375 g 3.375 g 12.5 mL/hr     . amoxicillin-clavulanate  1 tablet Oral Q12H  . estradiol  0.5 mg Oral QODAY  . fentaNYL  12.5 mcg Transdermal Q72H  . ferrous sulfate  325 mg Oral BID WC  . memantine  5 mg Oral BID  . mometasone-formoterol  2 puff Inhalation BID  . pantoprazole (PROTONIX) IV  40 mg Intravenous Q12H  . pregabalin  150 mg Oral BID  . vitamin B-12  1,000 mcg Oral Daily    Objective: Vital signs in last 24 hours: Temp:  [97.8 F (36.6 C)-98.5 F (36.9 C)] 98 F (36.7 C) (12/12 0800) Pulse Rate:  [75-103] 81 (12/12  0800) Resp:  [18-26] 20 (12/11 2236) BP: (119-141)/(50-75) 137/75 (12/12 0800) SpO2:  [93 %-100 %] 97 % (12/12 0800) Weight:  [49.4 kg] 49.4 kg (12/11 2236) Constitutional:  Very thin, chronically ill appearing, alert HENT: [place]/AT, PERRLA, no scleral icterus Mouth/Throat: Oropharynx is clear and moist. No oropharyngeal exudate.  Cardiovascular: Normal rate, regular rhythm and normal heart sounds. 1/6 sm Pulmonary/Chest: bil rhonchi, poor air movement Neck = supple, no nuchal rigidity Abdominal: Soft. Bowel sounds are normal.  exhibits no distension. There is no tenderness.  Lymphadenopathy: no cervical adenopathy. No axillary adenopathy Neurological: alert and interactive Skin: Skin is warm and dry. No rash noted. No erythema.  Psychiatric: lethargic  Lab Results Recent Labs    11/03/18 1015  11/04/18 0723  11/05/18 0123 11/05/18 0550  WBC 19.6*  --  15.1*  --   --   --   HGB 10.1*   < > 9.7*   < > 9.9* 9.8*  HCT 33.2*   < > 30.9*   < > 30.7* 29.7*  NA 141  --  138  --   --   --   K 3.5  --  3.4*  --   --   --   CL 107  --  106  --   --   --   CO2 28  --  27  --   --   --  BUN 15  --  11  --   --   --   CREATININE 0.97  --  0.81  --   --   --    < > = values in this interval not displayed.    Microbiology: Results for orders placed or performed during the hospital encounter of 11/03/18  Urine culture     Status: None   Collection Time: 11/03/18  6:21 AM  Result Value Ref Range Status   Specimen Description   Final    URINE, RANDOM Performed at [facility], [address] [place], [place] [address]    Special Requests   Final    NONE Performed at [facility], [address], [place], [place] [address]    Culture   Final    NO GROWTH Performed at [facility], [address], [place], [place] [address]    Report Status 11/04/2018 FINAL  Final  MRSA PCR Screening     Status: None   Collection Time: 11/03/18  9:57 AM  Result Value Ref  Range Status   MRSA by PCR NEGATIVE NEGATIVE Final    Comment:        The GeneXpert MRSA Assay (FDA approved for NASAL specimens only), is one component of a comprehensive MRSA colonization surveillance program. It is not intended to diagnose MRSA infection nor to guide or monitor treatment for MRSA infections. Performed at [facility], [address] [place], [place] [address]   Blood Culture (routine x 2)     Status: None (Preliminary result)   Collection Time: 11/03/18 10:15 AM  Result Value Ref Range Status   Specimen Description BLOOD BLOOD LEFT HAND  Final   Special Requests   Final    BOTTLES DRAWN AEROBIC AND ANAEROBIC Blood Culture results may not be optimal due to an inadequate volume of blood received in culture bottles   Culture   Final    NO GROWTH 2 DAYS Performed at [facility], [address] [place], [place] [address]    Report Status PENDING  Incomplete  Blood Culture (routine x 2)     Status: None (Preliminary result)   Collection Time: 11/03/18 10:15 AM  Result Value Ref Range Status   Specimen Description BLOOD LEFT ANTECUBITAL  Final   Special Requests   Final    BOTTLES DRAWN AEROBIC AND ANAEROBIC Blood Culture adequate volume   Culture   Final    NO GROWTH 2 DAYS Performed at [facility], [address], [place], [place] [address]    Report Status PENDING  Incomplete  Expectorated sputum assessment w rflx to resp cult     Status: None   Collection Time: 11/03/18 11:39 AM  Result Value Ref Range Status   Specimen Description EXPECTORATED SPUTUM  Final   Special Requests Normal  Final   Sputum evaluation   Final    THIS SPECIMEN IS ACCEPTABLE FOR SPUTUM CULTURE Performed at [facility], [address], [place], [place] [address]    Report Status 11/03/2018 FINAL  Final  Culture, respiratory     Status: None (Preliminary result)   Collection Time: 11/03/18 11:39 AM  Result Value Ref Range Status    Specimen Description   Final    EXPECTORATED SPUTUM Performed at [facility], [address], [place], [place] [address]    Special Requests   Final    Normal Reflexed from [id number] Performed at [facility], [address] [place], [place] [address]    Gram Stain PENDING  Incomplete   Culture   Final    CULTURE REINCUBATED FOR BETTER GROWTH Performed at [facility], [address] [place], [place] [address]    Report Status PENDING  Incomplete    Studies/Results: Dg Chest Port 1 View  Result Date: 11/04/2018 CLINICAL DATA:  Acute respiratory failure. EXAM: PORTABLE CHEST 1 VIEW COMPARISON:  Radiograph of November 03, 2018. FINDINGS: Stable cardiomediastinal silhouette. No pneumothorax or pleural effusion is noted. Minimal bibasilar subsegmental atelectasis is noted. Bony thorax is unremarkable. IMPRESSION: Minimal bibasilar subsegmental atelectasis. Electronically Signed   By: Lupita RaiderJames  Green Jr, M.D.   On: 11/04/2018 07:52    Assessment/Plan: Dimas MillinMary G Reffett is a 67 y.o. female with multiple medical problems including COPD on home oxygen admitted with confusion and acute respiratory distress.  She was febrile to 104.  She has a hx of recurrent aspiration per her husband as well.  She was started on IV vancomycin and Zosyn and admitted to the intensive care unit. MRSA PCR negative. Flu PCR neg, UA negative. BCX pending. Sputum cx pending. CXR with chronic changes but possible developing infiltrate. Currently in the unit O2 by nasal cannula.  Not on pressors. FU CXR no change. 12/12- no fevers, more alert, sputum cx pending. Recommendations. Cont Augmentin (started 12/12 in place of zosyn) for possible aspiration and healthcare associated pneumonia.  7 day total duration of abx.  Sputum cx is pending.   Thank you very much for the consult. Will follow with you.  Mick SellDavid P Derhonda Eastlick   11/05/2018, 1:18 PM

## 2018-11-05 NOTE — Progress Notes (Signed)
1        [facility] [place] [facility]   PATIENT NAME: Cheryl [name]    MR#:  [id number]  DATE OF BIRTH:  Nov 06, 1951  SUBJECTIVE:  CHIEF COMPLAINT:   Chief Complaint  Patient presents with  . Shortness of Breath   The patient is more alert and awake, she has shortness breath and cough, on oxygen by nasal cannula 4 L.  According to her husband, the patient is on oxygen as needed at home. REVIEW OF SYSTEMS:  Review of Systems  Constitutional: Negative for chills, fever and malaise/fatigue.  HENT: Negative for sore throat.   Eyes: Negative for blurred vision and double vision.  Respiratory: Positive for cough and shortness of breath. Negative for hemoptysis, wheezing and stridor.   Cardiovascular: Negative for chest pain, palpitations, orthopnea and leg swelling.  Gastrointestinal: Negative for abdominal pain, blood in stool, diarrhea, melena, nausea and vomiting.  Genitourinary: Negative for dysuria, flank pain and hematuria.  Musculoskeletal: Negative for back pain and joint pain.  Skin: Negative for rash.  Neurological: Negative for dizziness, sensory change, focal weakness, seizures, loss of consciousness, weakness and headaches.  Endo/Heme/Allergies: Negative for polydipsia.  Psychiatric/Behavioral: Negative for depression. The patient is not nervous/anxious.      DRUG ALLERGIES:   Allergies  Allergen Reactions  . Percocet [Oxycodone-Acetaminophen] Hives and Rash  . Aspirin Nausea And Vomiting and Other (See Comments)  . Codeine Nausea And Vomiting, Nausea Only and Other (See Comments)  . Propoxyphene Nausea Only and Other (See Comments)  . Sulfa Antibiotics Rash and Other (See Comments)   VITALS:  Blood pressure (!) 159/74, pulse 86, temperature 98.4 F (36.9 C), temperature source Oral, resp. rate 18, height 4\' 10"  (1.473 m), weight 49.4 kg, SpO2 100 %. PHYSICAL EXAMINATION:  Physical Exam Constitutional:      General: She is not in acute  distress. HENT:     Head: Normocephalic and atraumatic.     Mouth/Throat:     Mouth: Mucous membranes are moist.  Eyes:     General: No scleral icterus.    Extraocular Movements: Extraocular movements intact.     Conjunctiva/sclera: Conjunctivae normal.  Neck:     Musculoskeletal: Normal range of motion and neck supple.     Thyroid: No thyromegaly.     Vascular: No JVD.     Trachea: No tracheal deviation.  Cardiovascular:     Rate and Rhythm: Normal rate and regular rhythm.     Heart sounds: Normal heart sounds. No murmur. No gallop.   Pulmonary:     Effort: Pulmonary effort is normal. No respiratory distress.     Breath sounds: Decreased breath sounds present. No wheezing, rhonchi or rales.  Chest:     Chest wall: No tenderness.  Abdominal:     General: Bowel sounds are normal. There is no distension.     Palpations: Abdomen is soft.     Tenderness: There is no abdominal tenderness. There is no rebound.  Musculoskeletal: Normal range of motion.        General: No tenderness.  Skin:    General: Skin is warm and dry.     Findings: No erythema or rash.  Neurological:     General: No focal deficit present.     Mental Status: She is alert and oriented to person, place, and time.     Cranial Nerves: No cranial nerve deficit.  Psychiatric:        Mood and Affect: Mood normal.  LABORATORY PANEL:  Female CBC Recent Labs  Lab 11/04/18 0723  11/05/18 0550  WBC 15.1*  --   --   HGB 9.7*   < > 9.8*  HCT 30.9*   < > 29.7*  PLT 158  --   --    < > = values in this interval not displayed.   ------------------------------------------------------------------------------------------------------------------ Chemistries  Recent Labs  Lab 11/03/18 1015 11/04/18 0723 11/05/18 0550  NA 141 138  --   K 3.5 3.4*  --   CL 107 106  --   CO2 28 27  --   GLUCOSE 88 90  --   BUN 15 11  --   CREATININE 0.97 0.81  --   CALCIUM 7.6* 7.6*  --   MG  --   --  2.0  AST 26  --   --     ALT 11  --   --   ALKPHOS 51  --   --   BILITOT 0.6  --   --    RADIOLOGY:  No results found. ASSESSMENT AND PLAN:  18 y f with sepsis  * Sepsis due to pneumonia vancomycin was discontinued as MRSA PCR negative. She is on Zosyn for possible aspiration and healthcare associated pneumonia, change to oral augmentin for a total abx duration of 7 days per Dr. Sampson Goon.   Acute respiratory failure due to above. Try to wean off oxygen, nebulizer PRN.  * Elevated troponins - due to supply demand ischemia from sepsis Discontinue heparin drip per Dr. Juliann Pares. Start Plavix.  The patient has allergy to aspirin.  * Acute metabolic encephalopathy - likely due to sepsis - hold off Benzo and Narcotics. Improved.  COPD.  Stable.  Continue nebulizer.  Chronic diastolic CHF LV EF: 45% -   50%.  Stable.  Tobacco abuse.  Smoking cessation was counseled for 3 to 4 minutes, nicotine patch.  All the records are reviewed and case discussed with Care Management/Social Worker. Management plans discussed with the patient, her husband and sister, nursing and they are in agreement.  CODE STATUS: Full Code  TOTAL TIME TAKING CARE OF THIS PATIENT: 25 minutes.   More than 50% of the time was spent in counseling/coordination of care: YES  POSSIBLE D/C IN 2 DAYS, DEPENDING ON CLINICAL CONDITION.   Shaune Pollack M.D on 11/05/2018 at 6:14 PM  Between 7am to 6pm - Pager - [phone]  After 6pm go to [email or web address] - Social research officer, government  [facility]  Office  [phone]  CC: Primary care physician; Malva Limes, MD  Note: This dictation was prepared with Dragon dictation along with smaller phrase technology. Any transcriptional errors that result from this process are unintentional.

## 2018-11-05 NOTE — Progress Notes (Signed)
Advanced Care Plan.  Purpose of Encounter: CODE STATUS. Parties in Attendance: The patient, her husband and sister, me. Patient's Decisional Capacity: Yes. Medical Story: Cheryl [name]  is a 67 y.o. female with a known history of COPD, CHF, hypertension, hyperlipidemia, migraine, osteoporosis, GERD, encephalopathy etc. is admitted for suspected sepsis and respiratory failure.  I discussed with the patient and her husband about her current condition, prognosis and CODE STATUS.  The patient and her husband want resuscitation and intubation if she has cardiopulmonary arrest. Plan:  Code Status: Full code. Time spent discussing advance care planning: 17 minutes.

## 2018-11-06 LAB — BASIC METABOLIC PANEL
Anion gap: 11 (ref 5–15)
BUN: 6 mg/dL — ABNORMAL LOW (ref 8–23)
CO2: 22 mmol/L (ref 22–32)
Calcium: 8 mg/dL — ABNORMAL LOW (ref 8.9–10.3)
Chloride: 108 mmol/L (ref 98–111)
Creatinine, Ser: 0.79 mg/dL (ref 0.44–1.00)
GFR calc Af Amer: >60 mL/min (ref >60)
GFR calc non Af Amer: >60 mL/min (ref >60)
Glucose, Bld: 78 mg/dL (ref 70–99)
Potassium: 3 mmol/L — ABNORMAL LOW (ref 3.5–5.1)
Sodium: 141 mmol/L (ref 135–145)

## 2018-11-06 LAB — CBC
HCT: 31.1 % — ABNORMAL LOW (ref 36.0–46.0)
Hemoglobin: 10 g/dL — ABNORMAL LOW (ref 12.0–15.0)
MCH: 30 pg (ref 26.0–34.0)
MCHC: 32.2 g/dL (ref 30.0–36.0)
MCV: 93.4 fL (ref 80.0–100.0)
Platelets: 189 10*3/uL (ref 150–400)
RBC: 3.33 MIL/uL — ABNORMAL LOW (ref 3.87–5.11)
RDW: 14.6 % (ref 11.5–15.5)
WBC: 7.2 10*3/uL (ref 4.0–10.5)
nRBC: 0 % (ref 0.0–0.2)

## 2018-11-06 LAB — GLUCOSE, CAPILLARY
GLUCOSE-CAPILLARY: 77 mg/dL (ref 70–99)
Glucose-Capillary: 68 mg/dL — ABNORMAL LOW (ref 70–99)
Glucose-Capillary: 73 mg/dL (ref 70–99)
Glucose-Capillary: 73 mg/dL (ref 70–99)
Glucose-Capillary: 78 mg/dL (ref 70–99)
Glucose-Capillary: 91 mg/dL (ref 70–99)

## 2018-11-06 LAB — MAGNESIUM: Magnesium: 2 mg/dL (ref 1.7–2.4)

## 2018-11-06 MED ORDER — FUROSEMIDE 40 MG PO TABS
40.0000 mg | ORAL_TABLET | Freq: Once | ORAL | Status: AC
  Administered 2018-11-06: 40 mg via ORAL
  Filled 2018-11-06: qty 1

## 2018-11-06 MED ORDER — POTASSIUM CHLORIDE CRYS ER 20 MEQ PO TBCR
60.0000 meq | EXTENDED_RELEASE_TABLET | Freq: Once | ORAL | Status: AC
  Administered 2018-11-06: 60 meq via ORAL
  Filled 2018-11-06: qty 3

## 2018-11-06 MED ORDER — FUROSEMIDE 20 MG PO TABS
20.0000 mg | ORAL_TABLET | Freq: Every day | ORAL | Status: DC
  Administered 2018-11-07 – 2018-11-08 (×2): 20 mg via ORAL
  Filled 2018-11-06 (×2): qty 1

## 2018-11-06 MED ORDER — CLOPIDOGREL BISULFATE 75 MG PO TABS: 75.0000 mg | ORAL_TABLET | Freq: Every day | ORAL | Status: DC

## 2018-11-06 MED ORDER — ATORVASTATIN CALCIUM 20 MG PO TABS
40.0000 mg | ORAL_TABLET | Freq: Every day | ORAL | Status: DC
  Administered 2018-11-06 – 2018-11-07 (×2): 40 mg via ORAL
  Filled 2018-11-06 (×2): qty 2

## 2018-11-06 MED ORDER — ALPRAZOLAM 0.25 MG PO TABS
0.2500 mg | ORAL_TABLET | Freq: Every evening | ORAL | Status: DC | PRN
  Administered 2018-11-06 (×2): 0.5 mg via ORAL
  Filled 2018-11-06 (×2): qty 2

## 2018-11-06 NOTE — Progress Notes (Signed)
1        [facility] [place] [facility]   PATIENT NAME: Cheryl [name]    MR#:  [id number]  DATE OF BIRTH:  [birth year]  SUBJECTIVE:  CHIEF COMPLAINT:   Chief Complaint  Patient presents with  . Shortness of Breath   The patient has better shortness breath and cough, on oxygen by nasal cannula 4 L.  According to her husband, the patient is on oxygen 3 L as needed at home. REVIEW OF SYSTEMS:  Review of Systems  Constitutional: Positive for malaise/fatigue. Negative for chills and fever.  HENT: Negative for sore throat.   Eyes: Negative for blurred vision and double vision.  Respiratory: Positive for cough and shortness of breath. Negative for hemoptysis, wheezing and stridor.   Cardiovascular: Negative for chest pain, palpitations, orthopnea and leg swelling.  Gastrointestinal: Negative for abdominal pain, blood in stool, diarrhea, melena, nausea and vomiting.  Genitourinary: Negative for dysuria, flank pain and hematuria.  Musculoskeletal: Negative for back pain and joint pain.  Skin: Negative for rash.  Neurological: Negative for dizziness, sensory change, focal weakness, seizures, loss of consciousness, weakness and headaches.  Endo/Heme/Allergies: Negative for polydipsia.  Psychiatric/Behavioral: Negative for depression. The patient is not nervous/anxious.      DRUG ALLERGIES:   Allergies  Allergen Reactions  . Percocet [Oxycodone-Acetaminophen] Hives and Rash  . Aspirin Nausea And Vomiting and Other (See Comments)  . Codeine Nausea And Vomiting, Nausea Only and Other (See Comments)  . Propoxyphene Nausea Only and Other (See Comments)  . Sulfa Antibiotics Rash and Other (See Comments)   VITALS:  Blood pressure (!) 146/76, pulse 87, temperature 97.9 F (36.6 C), temperature source Oral, resp. rate 19, height 4\' 10"  (1.473 m), weight 49.4 kg, SpO2 99 %. PHYSICAL EXAMINATION:  Physical Exam Constitutional:      General: She is not in acute distress.   Comments: Malnutrition.  HENT:     Head: Normocephalic and atraumatic.     Mouth/Throat:     Mouth: Mucous membranes are moist.  Eyes:     General: No scleral icterus.    Extraocular Movements: Extraocular movements intact.     Conjunctiva/sclera: Conjunctivae normal.  Neck:     Musculoskeletal: Normal range of motion and neck supple.     Thyroid: No thyromegaly.     Vascular: No JVD.     Trachea: No tracheal deviation.  Cardiovascular:     Rate and Rhythm: Normal rate and regular rhythm.     Heart sounds: Normal heart sounds. No murmur. No gallop.   Pulmonary:     Effort: Pulmonary effort is normal. No respiratory distress.     Breath sounds: Decreased breath sounds and rales present. No wheezing or rhonchi.  Chest:     Chest wall: No tenderness.  Abdominal:     General: Bowel sounds are normal. There is no distension.     Palpations: Abdomen is soft.     Tenderness: There is no abdominal tenderness. There is no rebound.  Musculoskeletal: Normal range of motion.        General: No tenderness.  Skin:    General: Skin is warm and dry.     Findings: No erythema or rash.  Neurological:     General: No focal deficit present.     Mental Status: She is alert and oriented to person, place, and time.     Cranial Nerves: No cranial nerve deficit.  Psychiatric:        Mood  and Affect: Mood normal.    LABORATORY PANEL:  Female CBC Recent Labs  Lab 11/06/18 0557  WBC 7.2  HGB 10.0*  HCT 31.1*  PLT 189   ------------------------------------------------------------------------------------------------------------------ Chemistries  Recent Labs  Lab 11/03/18 1015  11/06/18 0557  NA 141   < > 141  K 3.5   < > 3.0*  CL 107   < > 108  CO2 28   < > 22  GLUCOSE 88   < > 78  BUN 15   < > 6*  CREATININE 0.97   < > 0.79  CALCIUM 7.6*   < > 8.0*  MG  --    < > 2.0  AST 26  --   --   ALT 11  --   --   ALKPHOS 51  --   --   BILITOT 0.6  --   --    < > = values in this  interval not displayed.   RADIOLOGY:  No results found. ASSESSMENT AND PLAN:  79 y f with sepsis  * Sepsis due to pneumonia vancomycin was discontinued as MRSA PCR negative. She is on Zosyn for possible aspiration and healthcare associated pneumonia, change to oral augmentin for a total abx duration of 7 days per Dr. Sampson Goon.   Acute respiratory failure due to above. Try to wean off oxygen, nebulizer PRN.  * Elevated troponins - due to supply demand ischemia from sepsis Discontinued heparin drip per Dr. Juliann Pares. Started Plavix (home meds).  The patient has allergy to aspirin.  * Acute metabolic encephalopathy - likely due to sepsis - hold off Benzo and Narcotics. Improved.  COPD.  Stable.  Continue nebulizer.  Chronic diastolic CHF LV EF: 45% -   50%.   Start low dose lasix.   Hypokalemia. Given KCl, f/u BMP.  Anemia of chronic disease. Stable.  Moderate malnutrition. F/u dietitian recommendation.  Tobacco abuse.  Smoking cessation was counseled for 3 to 4 minutes, nicotine patch.  Generalized weakness. Ambulation encouraged. Need HHPT.  All the records are reviewed and case discussed with Care Management/Social Worker. Management plans discussed with the patient, her husband and sister, nursing and they are in agreement.  CODE STATUS: Full Code  TOTAL TIME TAKING CARE OF THIS PATIENT: 26 minutes.   More than 50% of the time was spent in counseling/coordination of care: YES  POSSIBLE D/C IN 2 DAYS, DEPENDING ON CLINICAL CONDITION.   Shaune Pollack M.D on 11/06/2018 at 5:16 PM  Between 7am to 6pm - Pager - [phone]  After 6pm go to [email or web address] - Social research officer, government  [facility] [place] [facility]  Office  [phone]  CC: Primary care physician; Malva Limes, MD  Note: This dictation was prepared with Dragon dictation along with smaller phrase technology. Any transcriptional errors that result from this process are unintentional.

## 2018-11-06 NOTE — Progress Notes (Signed)
Pt requesting her xanax, MD Anne HahnWillis paged and ordered medication.

## 2018-11-06 NOTE — Care Management Important Message (Signed)
Important Message  Patient Details  Name: Cheryl [name] MRN: [id number] Date of Birth: [birth year]   Medicare Important Message Given:  Yes    Olegario MessierKathy A Lavon Bothwell 11/06/2018, 11:41 AM

## 2018-11-07 LAB — BASIC METABOLIC PANEL
ANION GAP: 11 (ref 5–15)
BUN: 8 mg/dL (ref 8–23)
CO2: 24 mmol/L (ref 22–32)
Calcium: 8.6 mg/dL — ABNORMAL LOW (ref 8.9–10.3)
Chloride: 104 mmol/L (ref 98–111)
Creatinine, Ser: 0.76 mg/dL (ref 0.44–1.00)
GFR calc Af Amer: >60 mL/min (ref >60)
GFR calc non Af Amer: >60 mL/min (ref >60)
Glucose, Bld: 88 mg/dL (ref 70–99)
Potassium: 3 mmol/L — ABNORMAL LOW (ref 3.5–5.1)
Sodium: 139 mmol/L (ref 135–145)

## 2018-11-07 LAB — CULTURE, RESPIRATORY

## 2018-11-07 LAB — GLUCOSE, CAPILLARY
Glucose-Capillary: 81 mg/dL (ref 70–99)
Glucose-Capillary: 82 mg/dL (ref 70–99)
Glucose-Capillary: 84 mg/dL (ref 70–99)
Glucose-Capillary: 88 mg/dL (ref 70–99)
Glucose-Capillary: 90 mg/dL (ref 70–99)

## 2018-11-07 LAB — CULTURE, RESPIRATORY W GRAM STAIN: Special Requests: NORMAL

## 2018-11-07 MED ORDER — ALPRAZOLAM 0.25 MG PO TABS
0.2500 mg | ORAL_TABLET | Freq: Every evening | ORAL | Status: DC | PRN
  Administered 2018-11-07: 0.25 mg via ORAL
  Filled 2018-11-07: qty 1

## 2018-11-07 MED ORDER — SUVOREXANT 5 MG PO TABS: 5.0000 mg | ORAL_TABLET | Freq: Every day | ORAL | Status: DC

## 2018-11-07 MED ORDER — POTASSIUM CHLORIDE CRYS ER 20 MEQ PO TBCR
60.0000 meq | EXTENDED_RELEASE_TABLET | Freq: Once | ORAL | Status: AC
  Administered 2018-11-07: 60 meq via ORAL
  Filled 2018-11-07: qty 3

## 2018-11-07 MED ORDER — ZOLPIDEM TARTRATE 5 MG PO TABS
5.0000 mg | ORAL_TABLET | Freq: Every evening | ORAL | Status: DC | PRN
  Administered 2018-11-07: 5 mg via ORAL
  Filled 2018-11-07: qty 1

## 2018-11-07 NOTE — Progress Notes (Signed)
PT Cancellation Note  Patient Details Name: Cheryl [name] MRN: [id number] DOB: 20-Aug-1951   Cancelled Treatment:    Reason Eval/Treat Not Completed: Fatigue/lethargy limiting ability to participate;Patient declined, no reason specified.  Order received.  Chart reviewed.  Pt very lethargic during introduction, closing eyes throughout.  Requested that PT come back tomorrow.  Will re-attempt later as time allows.  Glenetta HewSarah Sereen Schaff, PT, DPT  Glenetta HewSarah Delayni Streed 11/07/2018, 3:04 PM

## 2018-11-07 NOTE — Progress Notes (Signed)
1        [employer] [facility] [place] at [facility]   PATIENT NAME: Cheryl [name]    MR#:  [id number]  DATE OF BIRTH:  01/18/1951  SUBJECTIVE:  CHIEF COMPLAINT:   Chief Complaint  Patient presents with  . Shortness of Breath   The patient has generalized weakness, better shortness breath and cough, on oxygen by nasal cannula 4 L. REVIEW OF SYSTEMS:  Review of Systems  Constitutional: Positive for malaise/fatigue. Negative for chills and fever.  HENT: Negative for sore throat.   Eyes: Negative for blurred vision and double vision.  Respiratory: Positive for cough and shortness of breath. Negative for hemoptysis, wheezing and stridor.   Cardiovascular: Negative for chest pain, palpitations, orthopnea and leg swelling.  Gastrointestinal: Negative for abdominal pain, blood in stool, diarrhea, melena, nausea and vomiting.  Genitourinary: Negative for dysuria, flank pain and hematuria.  Musculoskeletal: Negative for back pain and joint pain.  Skin: Negative for rash.  Neurological: Negative for dizziness, sensory change, focal weakness, seizures, loss of consciousness, weakness and headaches.  Endo/Heme/Allergies: Negative for polydipsia.  Psychiatric/Behavioral: Negative for depression. The patient is not nervous/anxious.      DRUG ALLERGIES:   Allergies  Allergen Reactions  . Percocet [Oxycodone-Acetaminophen] Hives and Rash  . Aspirin Nausea And Vomiting and Other (See Comments)  . Codeine Nausea And Vomiting, Nausea Only and Other (See Comments)  . Propoxyphene Nausea Only and Other (See Comments)  . Sulfa Antibiotics Rash and Other (See Comments)   VITALS:  Blood pressure (!) 145/77, pulse 82, temperature 98 F (36.7 C), temperature source Oral, resp. rate 16, height 4\' 10"  (1.473 m), weight 49.4 kg, SpO2 96 %. PHYSICAL EXAMINATION:  Physical Exam Constitutional:      General: She is not in acute distress.    Comments: Malnutrition.  HENT:     Head:  Normocephalic and atraumatic.     Mouth/Throat:     Mouth: Mucous membranes are moist.  Eyes:     General: No scleral icterus.    Extraocular Movements: Extraocular movements intact.     Conjunctiva/sclera: Conjunctivae normal.  Neck:     Musculoskeletal: Normal range of motion and neck supple.     Thyroid: No thyromegaly.     Vascular: No JVD.     Trachea: No tracheal deviation.  Cardiovascular:     Rate and Rhythm: Normal rate and regular rhythm.     Heart sounds: Normal heart sounds. No murmur. No gallop.   Pulmonary:     Effort: Pulmonary effort is normal. No respiratory distress.     Breath sounds: Decreased breath sounds and rales present. No wheezing or rhonchi.  Chest:     Chest wall: No tenderness.  Abdominal:     General: Bowel sounds are normal. There is no distension.     Palpations: Abdomen is soft.     Tenderness: There is no abdominal tenderness. There is no rebound.  Musculoskeletal: Normal range of motion.        General: No tenderness.  Skin:    General: Skin is warm and dry.     Findings: No erythema or rash.  Neurological:     General: No focal deficit present.     Mental Status: She is alert and oriented to person, place, and time.     Cranial Nerves: No cranial nerve deficit.  Psychiatric:        Mood and Affect: Mood normal.    LABORATORY PANEL:  Female CBC  Recent Labs  Lab 11/06/18 0557  WBC 7.2  HGB 10.0*  HCT 31.1*  PLT 189   ------------------------------------------------------------------------------------------------------------------ Chemistries  Recent Labs  Lab 11/03/18 1015  11/06/18 0557 11/07/18 0411  NA 141   < > 141 139  K 3.5   < > 3.0* 3.0*  CL 107   < > 108 104  CO2 28   < > 22 24  GLUCOSE 88   < > 78 88  BUN 15   < > 6* 8  CREATININE 0.97   < > 0.79 0.76  CALCIUM 7.6*   < > 8.0* 8.6*  MG  --    < > 2.0  --   AST 26  --   --   --   ALT 11  --   --   --   ALKPHOS 51  --   --   --   BILITOT 0.6  --   --   --    <  > = values in this interval not displayed.   RADIOLOGY:  No results found. ASSESSMENT AND PLAN:  [age] y f with sepsis  * Sepsis due to pneumonia vancomycin was discontinued as MRSA PCR negative. She is on Zosyn for possible aspiration and healthcare associated pneumonia, change to oral augmentin for a total abx duration of 7 days per Dr. Sampson GoonFitzgerald.   Acute respiratory failure due to above. Try to wean down oxygen, nebulizer PRN.  * Elevated troponins - due to supply demand ischemia from sepsis Discontinued heparin drip per Dr. Juliann Paresallwood. continue Plavix (home meds).  The patient has allergy to aspirin.  * Acute metabolic encephalopathy - likely due to sepsis - hold off Benzo and Narcotics. Improved.  COPD.  Stable.  Continue nebulizer.  Chronic diastolic CHF LV EF: 45% -   50%.   Started low dose lasix.   Hypokalemia. Given KCl, f/u BMP.  Anemia of chronic disease. Stable.  Moderate malnutrition. F/u dietitian recommendation.  Tobacco abuse.  Smoking cessation was counseled for 3 to 4 minutes, nicotine patch.  Generalized weakness. Ambulation encouraged. Need HHPT.  All the records are reviewed and case discussed with Care Management/Social Worker. Management plans discussed with the patient, her husband and sister, nursing and they are in agreement.  CODE STATUS: Full Code  TOTAL TIME TAKING CARE OF THIS PATIENT: 26 minutes.   More than 50% of the time was spent in counseling/coordination of care: YES  POSSIBLE D/C IN 2 DAYS, DEPENDING ON CLINICAL CONDITION.   Shaune PollackQing Jaystin Mcgarvey M.D on 11/07/2018 at 3:46 PM  Between 7am to 6pm - Pager - [phone]  After 6pm go to [email or web address] - Social research officer, governmentpassword [email or web address]  [facility] [place] [facility]  Office  [phone]  CC: Primary care physician; Malva LimesFisher, Donald E, MD  Note: This dictation was prepared with Dragon dictation along with smaller phrase technology. Any transcriptional errors that result from this process  are unintentional.

## 2018-11-08 LAB — BASIC METABOLIC PANEL
Anion gap: 10 (ref 5–15)
BUN: 14 mg/dL (ref 8–23)
CO2: 25 mmol/L (ref 22–32)
CREATININE: 0.77 mg/dL (ref 0.44–1.00)
Calcium: 8.6 mg/dL — ABNORMAL LOW (ref 8.9–10.3)
Chloride: 104 mmol/L (ref 98–111)
GFR calc Af Amer: >60 mL/min (ref >60)
GFR calc non Af Amer: >60 mL/min (ref >60)
Glucose, Bld: 94 mg/dL (ref 70–99)
Potassium: 3.6 mmol/L (ref 3.5–5.1)
SODIUM: 139 mmol/L (ref 135–145)

## 2018-11-08 LAB — CULTURE, BLOOD (ROUTINE X 2)
CULTURE: NO GROWTH
Culture: NO GROWTH
Special Requests: ADEQUATE

## 2018-11-08 MED ORDER — GUAIFENESIN 100 MG/5ML PO SOLN: 5.0000 mL | ORAL | 0 refills | Status: DC | PRN

## 2018-11-08 MED ORDER — GUAIFENESIN 100 MG/5ML PO SOLN
5.0000 mL | ORAL | Status: DC | PRN
  Filled 2018-11-08: qty 5

## 2018-11-08 MED ORDER — FUROSEMIDE 20 MG PO TABS: 20.0000 mg | ORAL_TABLET | ORAL | 0 refills | Status: DC

## 2018-11-08 MED ORDER — ATORVASTATIN CALCIUM 40 MG PO TABS: 40.0000 mg | ORAL_TABLET | Freq: Every day | ORAL | 1 refills | Status: DC

## 2018-11-08 MED ORDER — AMOXICILLIN-POT CLAVULANATE 875-125 MG PO TABS: 1.0000 | ORAL_TABLET | Freq: Two times a day (BID) | ORAL | 0 refills | Status: DC

## 2018-11-08 NOTE — Discharge Summary (Signed)
[place] at [place] NAME: Cheryl [name]    MR#:  [id number]  DATE OF BIRTH:  04/22/1951  DATE OF ADMISSION:  11/03/2018   ADMITTING PHYSICIAN: Max Sane, MD  DATE OF DISCHARGE: 11/08/2018 12:57 PM  PRIMARY CARE PHYSICIAN: Birdie Sons, MD   ADMISSION DIAGNOSIS:  Shortness of breath [R06.02] Respiratory distress [R06.03] Hypoxia [R09.02] Elevated troponin [R79.89] NSTEMI (non-ST elevated myocardial infarction) ([place]) [I21.4] Fever, unspecified fever cause [R50.9] Aspiration pneumonia, unspecified aspiration pneumonia type, unspecified laterality, unspecified part of lung ([place]) [J69.0] Community acquired pneumonia, unspecified laterality [J18.9] DISCHARGE DIAGNOSIS:  Active Problems:   Shortness of breath   Sepsis ([place])   Fever   Respiratory distress   NSTEMI (non-ST elevated myocardial infarction) ([place])  SECONDARY DIAGNOSIS:   Past Medical History:  Diagnosis Date  . Acute postoperative pain 04/14/2017  . Allergy   . Anemia   . Anxiety   . Arthritis    fingers  . Aspiration pneumonitis ([place]) 11/24/2015  . Asthma   . CHF (congestive heart failure) ([place])   . Chronic pain   . COPD (chronic obstructive pulmonary disease) ([place])   . Coronary artery disease   . DVT (deep venous thrombosis) ([place])   . Dyspnea   . Dysrhythmia   . Encephalopathy    MULTIPLE TIMES  . GERD (gastroesophageal reflux disease)   . Headache   . Hyperlipidemia   . Hypertension   . Kyphoscoliosis deformity of spine   . Migraines   . Neuropathy 2010  . Osteoporosis   . Oxygen deficiency   . Peripheral vascular disease ([place])   . Pneumonia   . Pneumonia 11/19/2015  . Pneumonia 10/2016  . Pneumonia    aspiration 2018- 4 times in last year  . Vitamin D deficiency   . Wears dentures    full upper and lower   HOSPITAL COURSE:  * Sepsis due to pneumonia vancomycin was discontinued as MRSA PCR negative. She is on Zosyn for possible aspiration  and healthcare associated pneumonia, change to oral augmentin for a total abx duration of 7 days per Dr. Ola Spurr.   Acute on chronic respiratory failure due to above. Try to wean down oxygen to 3 L, she needs home oxygen 3 L this time.  Nebulizer PRN.  * Elevated troponins - due to supply demand ischemia from sepsis Discontinued heparin drip per Dr. Clayborn Bigness. continue Plavix (home meds).  The patient has allergy to aspirin.  * Acute metabolic encephalopathy - likely due to sepsis - hold off Benzo and Narcotics. Improved.  COPD.  Stable.  Continue nebulizer.  Chronic diastolic CHF LV EF: 67% - 50%.   Started low dose lasix.  The patient's husband wants to follow-up her cardiologist to decide whether continue Lasix or not.  Hypokalemia. Given KCl, improved.  Anemia of chronic disease. Stable.  Moderate malnutrition. F/u dietitian recommendation.  Encouraged oral intake.  Tobacco abuse.  Smoking cessation was counseled for 3 to 4 minutes, nicotine patch. Chronic back pain the patient is on fentanyl patch at home. Generalized weakness. Ambulation encouraged. Need HHPT.  DISCHARGE CONDITIONS:  Stable, discharged to home with home health and PT. CONSULTS OBTAINED:  Treatment Team:  Yolonda Kida, MD Leonel Ramsay, MD DRUG ALLERGIES:   Allergies  Allergen Reactions  . Percocet [Oxycodone-Acetaminophen] Hives and Rash  . Aspirin Nausea And Vomiting and Other (See Comments)  . Codeine Nausea And Vomiting, Nausea Only and Other (See Comments)  . Propoxyphene  Nausea Only and Other (See Comments)  . Sulfa Antibiotics Rash and Other (See Comments)   DISCHARGE MEDICATIONS:   Allergies as of 11/08/2018      Reactions   Percocet [oxycodone-acetaminophen] Hives, Rash   Aspirin Nausea And Vomiting, Other (See Comments)   Codeine Nausea And Vomiting, Nausea Only, Other (See Comments)   Propoxyphene Nausea Only, Other (See Comments)   Sulfa Antibiotics Rash,  Other (See Comments)      Medication List    STOP taking these medications   celecoxib 100 MG capsule Commonly known as:  CELEBREX   simvastatin 10 MG tablet Commonly known as:  ZOCOR     TAKE these medications   acetaminophen 325 MG tablet Commonly known as:  TYLENOL Take 2 tablets (650 mg total) by mouth every 6 (six) hours as needed for mild pain (or Fever >/= 101).   albuterol (2.5 MG/3ML) 0.083% nebulizer solution Commonly known as:  PROVENTIL Take 3 mLs (2.5 mg total) by nebulization every 4 (four) hours as needed for wheezing. What changed:  Another medication with the same name was changed. Make sure you understand how and when to take each.   VENTOLIN HFA 108 (90 Base) MCG/ACT inhaler Generic drug:  albuterol Inhale 2 puffs into the lungs every 4 (four) hours as needed for wheezing or shortness of breath. What changed:  See the new instructions.   alendronate 70 MG tablet Commonly known as:  FOSAMAX Take 1 tablet (70 mg total) by mouth every 7 (seven) days. Take with a full glass of water on an empty stomach. What changed:  when to take this   ALPRAZolam 0.5 MG tablet Commonly known as:  XANAX Take 0.5-1 tablets (0.25-0.5 mg total) by mouth at bedtime as needed for anxiety.   amoxicillin-clavulanate 875-125 MG tablet Commonly known as:  AUGMENTIN Take 1 tablet by mouth every 12 (twelve) hours.   atorvastatin 40 MG tablet Commonly known as:  LIPITOR Take 1 tablet (40 mg total) by mouth daily at 6 PM.   baclofen 10 MG tablet Commonly known as:  LIORESAL Take 1 tablet (10 mg total) by mouth 3 (three) times daily as needed for muscle spasms.   benzonatate 100 MG capsule Commonly known as:  TESSALON PERLES Take 1 capsule (100 mg total) by mouth 3 (three) times daily as needed for cough.   clopidogrel 75 MG tablet Commonly known as:  PLAVIX Take 75 mg by mouth daily.   diphenoxylate-atropine 2.5-0.025 MG tablet Commonly known as:  LOMOTIL Take 1 tablet by  mouth as needed for diarrhea or loose stools.   estradiol 0.5 MG tablet Commonly known as:  ESTRACE Take 1 tablet (0.5 mg total) by mouth every other day.   fentaNYL 50 MCG/HR Commonly known as:  DURAGESIC - dosed mcg/hr Place 1 patch (50 mcg total) onto the skin every 3 (three) days. Start taking on:  November 29, 2018 What changed:  Another medication with the same name was removed. Continue taking this medication, and follow the directions you see here.   ferrous sulfate 325 (65 FE) MG tablet Take 1 tablet (325 mg total) by mouth 2 (two) times daily with a meal.   Fluticasone-Salmeterol 250-50 MCG/DOSE Aepb Commonly known as:  ADVAIR DISKUS TAKE ONE PUFF TWICE DAILY.   furosemide 20 MG tablet Commonly known as:  LASIX Take 1 tablet (20 mg total) by mouth every other day.   guaiFENesin 100 MG/5ML Soln Commonly known as:  ROBITUSSIN Take 5 mLs (100 mg total)  by mouth every 4 (four) hours as needed for cough or to loosen phlegm.   memantine 5 MG tablet Commonly known as:  NAMENDA Take 5 mg by mouth 2 (two) times daily.   NARCAN 4 MG/0.1ML Liqd nasal spray kit Generic drug:  naloxone Place 0.4 mg into the nose daily as needed (for accidental overdose.).   ondansetron 4 MG tablet Commonly known as:  ZOFRAN Take 4 mg by mouth as needed.   pantoprazole 40 MG tablet Commonly known as:  PROTONIX Take 40 mg by mouth 2 (two) times daily. Take 1 tablet (40 mg) by mouth scheduled every morning, may repeat dose in evening if needed for heartburn/indigestion.   pregabalin 300 MG capsule Commonly known as:  LYRICA Take 300 mg by mouth 2 (two) times daily.   silver sulfADIAZINE 1 % cream Commonly known as:  SILVADENE Apply 1 application topically daily as needed (leg infection).   Suvorexant 5 MG Tabs Commonly known as:  BELSOMRA Take 1 tablet by mouth at bedtime as needed (for insomnia). What changed:    how much to take  when to take this   tiotropium 18 MCG inhalation  capsule Commonly known as:  SPIRIVA HANDIHALER inhale the contents of one capsule in the handihaler once daily What changed:    how much to take  how to take this  when to take this   triamcinolone 0.025 % ointment Commonly known as:  KENALOG Apply 1 application topically 2 (two) times daily. What changed:    when to take this  reasons to take this   vitamin B-12 1000 MCG tablet Commonly known as:  CYANOCOBALAMIN Take 1 tablet (1,000 mcg total) by mouth daily.        DISCHARGE INSTRUCTIONS:  See AVS.  If you experience worsening of your admission symptoms, develop shortness of breath, life threatening emergency, suicidal or homicidal thoughts you must seek medical attention immediately by calling 911 or calling your MD immediately  if symptoms less severe.  You Must read complete instructions/literature along with all the possible adverse reactions/side effects for all the Medicines you take and that have been prescribed to you. Take any new Medicines after you have completely understood and accpet all the possible adverse reactions/side effects.   Please note  You were cared for by a hospitalist during your hospital stay. If you have any questions about your discharge medications or the care you received while you were in the hospital after you are discharged, you can call the unit and asked to speak with the hospitalist on call if the hospitalist that took care of you is not available. Once you are discharged, your primary care physician will handle any further medical issues. Please note that NO REFILLS for any discharge medications will be authorized once you are discharged, as it is imperative that you return to your primary care physician (or establish a relationship with a primary care physician if you do not have one) for your aftercare needs so that they can reassess your need for medications and monitor your lab values.    On the day of Discharge:  VITAL SIGNS:    Blood pressure (!) 143/84, pulse (!) 130, temperature (!) 97.5 F (36.4 C), temperature source Oral, resp. rate 17, height '4\' 10"'$  (1.473 m), weight 44.6 kg, SpO2 (!) 79 %. PHYSICAL EXAMINATION:  GENERAL:  67 y.o.-year-old patient lying in the bed with no acute distress.  Malnutrition. EYES: Pupils equal, round, reactive to light and accommodation. No scleral  icterus. Extraocular muscles intact.  HEENT: Head atraumatic, normocephalic. Oropharynx and nasopharynx clear.  NECK:  Supple, no jugular venous distention. No thyroid enlargement, no tenderness.  LUNGS: Normal breath sounds bilaterally, no wheezing, rales,rhonchi or crepitation. No use of accessory muscles of respiration.  CARDIOVASCULAR: S1, S2 normal. No murmurs, rubs, or gallops.  ABDOMEN: Soft, non-tender, non-distended. Bowel sounds present. No organomegaly or mass.  EXTREMITIES: No pedal edema, cyanosis, or clubbing.  NEUROLOGIC: Cranial nerves II through XII are intact. Muscle strength 4/5 in all extremities. Sensation intact. Gait not checked.  PSYCHIATRIC: The patient is alert and oriented x 3.  SKIN: No obvious rash, lesion, or ulcer.  DATA REVIEW:   CBC Recent Labs  Lab 11/06/18 0557  WBC 7.2  HGB 10.0*  HCT 31.1*  PLT 189    Chemistries  Recent Labs  Lab 11/03/18 1015  11/06/18 0557  11/08/18 0549  NA 141   < > 141   < > 139  K 3.5   < > 3.0*   < > 3.6  CL 107   < > 108   < > 104  CO2 28   < > 22   < > 25  GLUCOSE 88   < > 78   < > 94  BUN 15   < > 6*   < > 14  CREATININE 0.97   < > 0.79   < > 0.77  CALCIUM 7.6*   < > 8.0*   < > 8.6*  MG  --    < > 2.0  --   --   AST 26  --   --   --   --   ALT 11  --   --   --   --   ALKPHOS 51  --   --   --   --   BILITOT 0.6  --   --   --   --    < > = values in this interval not displayed.     Microbiology Results  Results for orders placed or performed during the hospital encounter of 11/03/18  Urine culture     Status: None   Collection Time: 11/03/18  6:21  AM  Result Value Ref Range Status   Specimen Description   Final    URINE, RANDOM Performed at [facility], [address], [place], [place] [address]    Special Requests   Final    NONE Performed at [facility], [address], [place], [place] [address]    Culture   Final    NO GROWTH Performed at [facility], [place] [address] [place], [place] [address]    Report Status 11/04/2018 FINAL  Final  MRSA PCR Screening     Status: None   Collection Time: 11/03/18  9:57 AM  Result Value Ref Range Status   MRSA by PCR NEGATIVE NEGATIVE Final    Comment:        The GeneXpert MRSA Assay (FDA approved for NASAL specimens only), is one component of a comprehensive MRSA colonization surveillance program. It is not intended to diagnose MRSA infection nor to guide or monitor treatment for MRSA infections. Performed at [facility], [address] [place], [place] [address]   Blood Culture (routine x 2)     Status: None   Collection Time: 11/03/18 10:15 AM  Result Value Ref Range Status   Specimen Description BLOOD BLOOD LEFT HAND  Final   Special  Requests   Final    BOTTLES DRAWN AEROBIC AND ANAEROBIC Blood Culture results may not be optimal due to an inadequate volume of blood received in culture bottles   Culture   Final    NO GROWTH 5 DAYS Performed at [facility], [address] [place], [place] [address]    Report Status 11/08/2018 FINAL  Final  Blood Culture (routine x 2)     Status: None   Collection Time: 11/03/18 10:15 AM  Result Value Ref Range Status   Specimen Description BLOOD LEFT ANTECUBITAL  Final   Special Requests   Final    BOTTLES DRAWN AEROBIC AND ANAEROBIC Blood Culture adequate volume   Culture   Final    NO GROWTH 5 DAYS Performed at [facility], [address] [place], [place] [address]    Report Status 11/08/2018 FINAL  Final  Expectorated sputum assessment w rflx to resp cult     Status:  None   Collection Time: 11/03/18 11:39 AM  Result Value Ref Range Status   Specimen Description EXPECTORATED SPUTUM  Final   Special Requests Normal  Final   Sputum evaluation   Final    THIS SPECIMEN IS ACCEPTABLE FOR SPUTUM CULTURE Performed at [facility], [address], [place], [place] [address]    Report Status 11/03/2018 FINAL  Final  Culture, respiratory     Status: None   Collection Time: 11/03/18 11:39 AM  Result Value Ref Range Status   Specimen Description   Final    EXPECTORATED SPUTUM Performed at [facility], [address] [place], [place] [address]    Special Requests   Final    Normal Reflexed from [id number] Performed at [facility], [address] [place], [place] [address]    Gram Stain   Final    ABUNDANT WBC PRESENT, PREDOMINANTLY PMN FEW GRAM POSITIVE COCCI Performed at [facility], [place] [address], [place],  [address]    Culture FEW KLEBSIELLA PNEUMONIAE  Final   Report Status 11/07/2018 FINAL  Final   Organism ID, Bacteria KLEBSIELLA PNEUMONIAE  Final      Susceptibility   Klebsiella pneumoniae - MIC*    AMPICILLIN >=32 RESISTANT Resistant     CEFAZOLIN <=4 SENSITIVE Sensitive     CEFEPIME <=1 SENSITIVE Sensitive     CEFTAZIDIME <=1 SENSITIVE Sensitive     CEFTRIAXONE <=1 SENSITIVE Sensitive     CIPROFLOXACIN <=0.25 SENSITIVE Sensitive     GENTAMICIN <=1 SENSITIVE Sensitive     IMIPENEM <=0.25 SENSITIVE Sensitive     TRIMETH/SULFA <=20 SENSITIVE Sensitive     AMPICILLIN/SULBACTAM 4 SENSITIVE Sensitive     PIP/TAZO <=4 SENSITIVE Sensitive     Extended ESBL NEGATIVE Sensitive     * FEW KLEBSIELLA PNEUMONIAE    RADIOLOGY:  No results found.   Management plans discussed with the patient, her husband and sister and they are in agreement.  CODE STATUS: Full Code   TOTAL TIME TAKING CARE OF THIS PATIENT: 35 minutes.    Demetrios Loll M.D on 11/08/2018 at 2:13 PM  Between 7am to 6pm - Pager -  [phone]  After 6pm go to [email or web address] - Proofreader  [facility] [place] [facility]  Office  [phone]  CC: Primary care physician; Birdie Sons, MD   Note: This dictation was prepared with Dragon dictation along with smaller phrase technology. Any transcriptional errors that result from this process are unintentional.

## 2018-11-08 NOTE — Care Management Note (Signed)
Case Management Note  Patient Details  Name: Cheryl [name] MRN: [id number] Date of Birth: 06-04-1951  Subjective/Objective:  Patient to be discharged per MD order. Orders in place for home health services. Patient agreeable to resume care with [facility]. Referral placed with J[facility] at [facility]. No DME needs. Spouse to transport.                      Action/Plan:   Expected Discharge Date:  11/08/18               Expected Discharge Plan:  Home w Home Health Services  In-House Referral:     Discharge planning Services     Post Acute Care Choice:  Resumption of Svcs/PTA Provider, Home Health Choice offered to:  Patient, Spouse  DME Arranged:    DME Agency:     HH Arranged:  RN, PT, Nurse's Aide HH Agency:  [facility]  Status of Service:  Completed, signed off  If discussed at Long Length of Stay Meetings, dates discussed:    Additional Comments:  Edwinna AreolaJosh A Binnie Droessler, RN 11/08/2018, 11:15 AM

## 2018-11-08 NOTE — Evaluation (Addendum)
Physical Therapy Evaluation Patient Details Name: Cheryl [name] MRN: [id number] DOB: 1951-04-13 Today's Date: 11/08/2018   History of Present Illness  Patient is a 67 year old female admitted from home for sepsis, pneumonia, NSTEMI and acute metabolic encephalopathy following c/o SOB.  PMH includes COPD, DJD and CHF.  ADDENDED 0900, 11/08/2018  Clinical Impression  Patient is a 67 year old female who lives in a one story home with her husband.  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  Pt is pleasant and eager to work with therapy but reports feeling very "weak and exhausted".  She is able to perform bed mobility mod I and sits at the EOB with poor posture and sitting balance.  She presents with weakness of LE's and UE's and reports no sensation loss in feet.  Pt able to stand from bedside with increased effort and use of RW, with HR increase to 130 BPM.  Pt attempted walking 15 ft with RW with gait deviations indicative of fall risk and desatting to 79% O2.  She promptly recovered to 91% when sitting but still reported symptoms of weakness and dizziness.  Pt will continue to benefit from skilled PT with focus on strength, tolerance to activity and balance and fall prevention.  [facility] once [facility]als have been stabilized.    Follow Up Recommendations Home health PT;Supervision - Intermittent    Equipment Recommendations  None recommended by PT    Recommendations for Other Services       Precautions / Restrictions Precautions Precautions: Fall Restrictions Weight Bearing Restrictions: No      Mobility  Bed Mobility Overal bed mobility: Modified Independent             General bed mobility comments: Increased time and effort with HOB elevated.  Transfers Overall transfer level: Needs assistance Equipment used: Rolling walker (2 wheeled) Transfers: Sit to/from Stand Sit to Stand: Min guard         General  transfer comment: Very labored effort to stand with pt being vocal about pain and weakness.  PT did not assist pt in rising but maintained very close CGA.  Pt's HR elevated to 130 BPM when standing and decreased to 111 BPM after standing for 30 sec.  Ambulation/Gait Ambulation/Gait assistance: Min guard Gait Distance (Feet): 15 Feet Assistive device: Rolling walker (2 wheeled)     Gait velocity interpretation: <1.8 ft/sec, indicate of risk for recurrent falls General Gait Details: Low foot clearance, poor posture and increased effort to manage RW.  Pt reported fatigue and feeling "dizzy".  Pt O2 desat to 79% and pt returned to bed promptly.  Stairs            Wheelchair Mobility    Modified Rankin (Stroke Patients Only)       Balance Overall balance assessment: Needs assistance Sitting-balance support: Bilateral upper extremity supported;Feet supported Sitting balance-Leahy Scale: Poor   Postural control: Posterior lean Standing balance support: Bilateral upper extremity supported Standing balance-Leahy Scale: Poor Standing balance comment: Heavily reliant on RW                             Pertinent Vitals/Pain Pain Assessment: Faces Faces Pain Scale: Hurts little more Pain Location: Back pain; also reports pain in LE's when standing which she later stated that she felt was stiffness due to having been in bed. Pain  Descriptors / Indicators: Aching;Grimacing;Moaning Pain Intervention(s): Limited activity within patient's tolerance;Monitored during session    Home Living Family/patient expects to be discharged to:: Private residence Living Arrangements: Spouse/significant other Available Help at Discharge: Family;Available 24 hours/day Type of Home: House Home Access: Ramped entrance     Home Layout: One level Home Equipment: Wheelchair - [employer] - 2 wheels;Bedside commode Additional Comments: Walks limited community distances.    Prior Function  Level of Independence: Needs assistance      ADL's / Homemaking Assistance Needed: Assistance with bathing; needs help getting in and out of shower.        Hand Dominance        Extremity/Trunk Assessment   Upper Extremity Assessment Upper Extremity Assessment: Generalized weakness(Wrist/elbow: Grossly 3+/5, Shoulder flexion: 3-/5)    Lower Extremity Assessment Lower Extremity Assessment: Generalized weakness    Cervical / Trunk Assessment Cervical / Trunk Assessment: Kyphotic  Communication   Communication: No difficulties  Cognition Arousal/Alertness: Awake/alert Behavior During Therapy: Restless Overall Cognitive Status: Within Functional Limits for tasks assessed                                 General Comments: Follows directions consistently.      General Comments      Exercises     Assessment/Plan    PT Assessment Patient needs continued PT services  PT Problem List Decreased strength;Decreased mobility;Decreased balance;Decreased activity tolerance;Cardiopulmonary status limiting activity       PT Treatment Interventions DME instruction;Functional mobility training;Balance training;Patient/family education;Gait training;Therapeutic activities;Therapeutic exercise    PT Goals (Current goals can be found in the Care Plan section)  Acute Rehab PT Goals Patient Stated Goal: To return home as soon as possible. PT Goal Formulation: With patient Time For Goal Achievement: 11/22/18 Potential to Achieve Goals: Good    Frequency Min 2X/week   Barriers to discharge        Co-evaluation               AM-PAC PT "6 Clicks" Mobility  Outcome Measure Help needed turning from your back to your side while in a flat bed without using bedrails?: None Help needed moving from lying on your back to sitting on the side of a flat bed without using bedrails?: None Help needed moving to and from a bed to a chair (including a wheelchair)?: A  Little Help needed standing up from a chair using your arms (e.g., wheelchair or bedside chair)?: A Little Help needed to walk in hospital room?: A Lot Help needed climbing 3-5 steps with a railing? : A Lot 6 Click Score: 18    End of Session Equipment Utilized During Treatment: Gait belt;Oxygen Activity Tolerance: Patient limited by fatigue;Patient limited by pain Patient left: in bed;with call bell/phone within reach;with family/visitor present Nurse Communication: Mobility status PT Visit Diagnosis: Unsteadiness on feet (R26.81);History of falling (Z91.81);Pain Pain - part of body: (Back)    Time: 1610-9604 PT Time Calculation (min) (ACUTE ONLY): 14 min   Charges:   PT Evaluation $PT Eval Low Complexity: 1 Low          Glenetta Hew, PT, DPT   Glenetta Hew 11/08/2018, 8:30 AM, ADDENDUM 0900, 11/08/2018

## 2018-11-08 NOTE — Discharge Instructions (Signed)
Smoking cessation. Needs home O2  3 L. HHPT Follow up cardiologist in 1 week.

## 2018-11-09 ENCOUNTER — Telehealth: Payer: Self-pay | Admitting: Family Medicine

## 2018-11-09 DIAGNOSIS — F17210 Nicotine dependence, cigarettes, uncomplicated: Secondary | ICD-10-CM | POA: Diagnosis not present

## 2018-11-09 DIAGNOSIS — I10 Essential (primary) hypertension: Secondary | ICD-10-CM | POA: Diagnosis not present

## 2018-11-09 DIAGNOSIS — G8929 Other chronic pain: Secondary | ICD-10-CM | POA: Diagnosis not present

## 2018-11-09 DIAGNOSIS — I739 Peripheral vascular disease, unspecified: Secondary | ICD-10-CM | POA: Diagnosis not present

## 2018-11-09 DIAGNOSIS — G609 Hereditary and idiopathic neuropathy, unspecified: Secondary | ICD-10-CM | POA: Diagnosis not present

## 2018-11-09 DIAGNOSIS — I2510 Atherosclerotic heart disease of native coronary artery without angina pectoris: Secondary | ICD-10-CM | POA: Diagnosis not present

## 2018-11-09 DIAGNOSIS — D509 Iron deficiency anemia, unspecified: Secondary | ICD-10-CM | POA: Diagnosis not present

## 2018-11-09 DIAGNOSIS — J449 Chronic obstructive pulmonary disease, unspecified: Secondary | ICD-10-CM | POA: Diagnosis not present

## 2018-11-09 DIAGNOSIS — M4180 Other forms of scoliosis, site unspecified: Secondary | ICD-10-CM | POA: Diagnosis not present

## 2018-11-09 NOTE — Telephone Encounter (Signed)
[name] requesting verbal orders to continue services for  Nursing: 2 week 1, 1 week 4  Home health aide: 2 week 1 3 week 4

## 2018-11-09 NOTE — Telephone Encounter (Signed)
That's fine

## 2018-11-09 NOTE — Telephone Encounter (Signed)
Verbal orders given to [name].  

## 2018-11-09 NOTE — Telephone Encounter (Signed)
Pt needing a Hospital f/u from pneumonia.  Asking if they can use tomorrow's visit at 8 w/[facility] as a hospital f/u.  Please call husband back asap.  Thanks, [facility]

## 2018-11-09 NOTE — Telephone Encounter (Signed)
Yes, [name] advised.   Thanks,   -Vernona RiegerLaura

## 2018-11-10 ENCOUNTER — Ambulatory Visit (INDEPENDENT_AMBULATORY_CARE_PROVIDER_SITE_OTHER): Payer: Medicare HMO | Admitting: Family Medicine

## 2018-11-10 ENCOUNTER — Encounter: Payer: Self-pay | Admitting: Family Medicine

## 2018-11-10 VITALS — BP 108/63 | HR 92 | Temp 97.7°F | Resp 18

## 2018-11-10 DIAGNOSIS — M4180 Other forms of scoliosis, site unspecified: Secondary | ICD-10-CM | POA: Diagnosis not present

## 2018-11-10 DIAGNOSIS — I2510 Atherosclerotic heart disease of native coronary artery without angina pectoris: Secondary | ICD-10-CM

## 2018-11-10 DIAGNOSIS — J189 Pneumonia, unspecified organism: Secondary | ICD-10-CM | POA: Diagnosis not present

## 2018-11-10 DIAGNOSIS — E785 Hyperlipidemia, unspecified: Secondary | ICD-10-CM

## 2018-11-10 DIAGNOSIS — G8929 Other chronic pain: Secondary | ICD-10-CM | POA: Diagnosis not present

## 2018-11-10 DIAGNOSIS — G609 Hereditary and idiopathic neuropathy, unspecified: Secondary | ICD-10-CM | POA: Diagnosis not present

## 2018-11-10 DIAGNOSIS — Z720 Tobacco use: Secondary | ICD-10-CM | POA: Diagnosis not present

## 2018-11-10 DIAGNOSIS — Z23 Encounter for immunization: Secondary | ICD-10-CM

## 2018-11-10 DIAGNOSIS — F17210 Nicotine dependence, cigarettes, uncomplicated: Secondary | ICD-10-CM | POA: Diagnosis not present

## 2018-11-10 DIAGNOSIS — J441 Chronic obstructive pulmonary disease with (acute) exacerbation: Secondary | ICD-10-CM

## 2018-11-10 DIAGNOSIS — J449 Chronic obstructive pulmonary disease, unspecified: Secondary | ICD-10-CM | POA: Diagnosis not present

## 2018-11-10 DIAGNOSIS — D509 Iron deficiency anemia, unspecified: Secondary | ICD-10-CM | POA: Diagnosis not present

## 2018-11-10 DIAGNOSIS — E559 Vitamin D deficiency, unspecified: Secondary | ICD-10-CM | POA: Diagnosis not present

## 2018-11-10 DIAGNOSIS — I10 Essential (primary) hypertension: Secondary | ICD-10-CM | POA: Diagnosis not present

## 2018-11-10 DIAGNOSIS — I739 Peripheral vascular disease, unspecified: Secondary | ICD-10-CM | POA: Diagnosis not present

## 2018-11-10 MED ORDER — BENZONATATE 100 MG PO CAPS: 100.0000 mg | ORAL_CAPSULE | Freq: Three times a day (TID) | ORAL | 3 refills | Status: DC | PRN

## 2018-11-10 MED ORDER — VARENICLINE TARTRATE 0.5 MG X 11 & 1 MG X 42 PO MISC: ORAL | 1 refills | Status: AC

## 2018-11-10 NOTE — Progress Notes (Signed)
Patient: Cheryl [name] Female    DOB: January 19, 1951   67 y.o.   MRN: [id number] Visit Date: 11/10/2018  Today's Provider: Lelon Huh, MD   Chief Complaint  Patient presents with  . Follow-up   Subjective:     HPI  Follow up Hospitalization  Patient was admitted to [facility] on 11/03/2018 and discharged on 11/08/2018. She was treated for shortness of breath, Hypoxia, elevated Troponin, NSTEMI, pneumonia and Chronic diastolic CHF. Treatment for this included prescribing Augmentin for 7 days. Patient was also started on low dose lasix for CHF, and advised to follow up with Cardiology as out patient. She reports fair compliance with treatment. Patient's husband states that she is only taking Furosemide every other day. She reports this condition is Improved. Patient states her breathing has imrpoved. She denies any swelling. She has follow up scheduled with Dr. Humphrey Rolls on 11-12-2018. States she is still smoking and has no intention of stopping.   ------------------------------------------------------------------------------------    Allergies  Allergen Reactions  . Percocet [Oxycodone-Acetaminophen] Hives and Rash  . Aspirin Nausea And Vomiting and Other (See Comments)  . Codeine Nausea And Vomiting, Nausea Only and Other (See Comments)  . Propoxyphene Nausea Only and Other (See Comments)  . Sulfa Antibiotics Rash and Other (See Comments)     Current Outpatient Medications:  .  acetaminophen (TYLENOL) 325 MG tablet, Take 2 tablets (650 mg total) by mouth every 6 (six) hours as needed for mild pain (or Fever >/= 101)., Disp: , Rfl:  .  albuterol (PROVENTIL) (2.5 MG/3ML) 0.083% nebulizer solution, Take 3 mLs (2.5 mg total) by nebulization every 4 (four) hours as needed for wheezing., Disp: 50 vial, Rfl: 12 .  alendronate (FOSAMAX) 70 MG tablet, Take 1 tablet (70 mg total) by mouth every 7 (seven) days. Take with a full glass of water on an empty stomach. (Patient taking differently:  Take 70 mg by mouth every Monday. Take with a full glass of water on an empty stomach.), Disp: 4 tablet, Rfl: 11 .  ALPRAZolam (XANAX) 0.5 MG tablet, Take 0.5-1 tablets (0.25-0.5 mg total) by mouth at bedtime as needed for anxiety., Disp: 30 tablet, Rfl: 5 .  amoxicillin-clavulanate (AUGMENTIN) 875-125 MG tablet, Take 1 tablet by mouth every 12 (twelve) hours., Disp: 8 tablet, Rfl: 0 .  atorvastatin (LIPITOR) 40 MG tablet, Take 1 tablet (40 mg total) by mouth daily at 6 PM., Disp: 30 tablet, Rfl: 1 .  baclofen (LIORESAL) 10 MG tablet, Take 1 tablet (10 mg total) by mouth 3 (three) times daily as needed for muscle spasms., Disp: 30 tablet, Rfl: 0 .  benzonatate (TESSALON PERLES) 100 MG capsule, Take 1 capsule (100 mg total) by mouth 3 (three) times daily as needed for cough., Disp: 20 capsule, Rfl: 1 .  clopidogrel (PLAVIX) 75 MG tablet, Take 75 mg by mouth daily., Disp: , Rfl:  .  diphenoxylate-atropine (LOMOTIL) 2.5-0.025 MG tablet, Take 1 tablet by mouth as needed for diarrhea or loose stools., Disp: , Rfl:  .  estradiol (ESTRACE) 0.5 MG tablet, Take 1 tablet (0.5 mg total) by mouth every other day., Disp: 45 tablet, Rfl: 2 .  [START ON 11/29/2018] fentaNYL (DURAGESIC - DOSED MCG/HR) 50 MCG/HR, Place 1 patch (50 mcg total) onto the skin every 3 (three) days., Disp: 10 patch, Rfl: 0 .  ferrous sulfate 325 (65 FE) MG tablet, Take 1 tablet (325 mg total) by mouth 2 (two) times daily with a meal., Disp: 60 tablet,  Rfl: 3 .  Fluticasone-Salmeterol (ADVAIR DISKUS) 250-50 MCG/DOSE AEPB, TAKE ONE PUFF TWICE DAILY., Disp: 60 each, Rfl: 5 .  furosemide (LASIX) 20 MG tablet, Take 1 tablet (20 mg total) by mouth every other day., Disp: 30 tablet, Rfl: 0 .  guaiFENesin (ROBITUSSIN) 100 MG/5ML SOLN, Take 5 mLs (100 mg total) by mouth every 4 (four) hours as needed for cough or to loosen phlegm., Disp: 236 mL, Rfl: 0 .  memantine (NAMENDA) 5 MG tablet, Take 5 mg by mouth 2 (two) times daily., Disp: , Rfl:  .  NARCAN  4 MG/0.1ML LIQD nasal spray kit, Place 0.4 mg into the nose daily as needed (for accidental overdose.). , Disp: , Rfl:  .  ondansetron (ZOFRAN) 4 MG tablet, Take 4 mg by mouth as needed., Disp: , Rfl:  .  pantoprazole (PROTONIX) 40 MG tablet, Take 40 mg by mouth 2 (two) times daily. Take 1 tablet (40 mg) by mouth scheduled every morning, may repeat dose in evening if needed for heartburn/indigestion., Disp: , Rfl:  .  pregabalin (LYRICA) 300 MG capsule, Take 300 mg by mouth 2 (two) times daily., Disp: , Rfl:  .  silver sulfADIAZINE (SILVADENE) 1 % cream, Apply 1 application topically daily as needed (leg infection). , Disp: , Rfl:  .  Suvorexant (BELSOMRA) 5 MG TABS, Take 1 tablet by mouth at bedtime as needed (for insomnia). (Patient taking differently: Take 5 mg by mouth at bedtime. ), Disp: 30 tablet, Rfl: 3 .  tiotropium (SPIRIVA HANDIHALER) 18 MCG inhalation capsule, inhale the contents of one capsule in the handihaler once daily (Patient taking differently: Place 18 mcg into inhaler and inhale daily. inhale the contents of one capsule in the handihaler once daily), Disp: 30 capsule, Rfl: 12 .  triamcinolone (KENALOG) 0.025 % ointment, Apply 1 application topically 2 (two) times daily. (Patient taking differently: Apply 1 application topically 2 (two) times daily as needed (cracking skin). ), Disp: 15 g, Rfl: 0 .  VENTOLIN HFA 108 (90 Base) MCG/ACT inhaler, Inhale 2 puffs into the lungs every 4 (four) hours as needed for wheezing or shortness of breath. (Patient taking differently: Inhale 2 puffs into the lungs every 4 (four) hours as needed for wheezing or shortness of breath. ), Disp: 18 g, Rfl: 5 .  vitamin B-12 (CYANOCOBALAMIN) 1000 MCG tablet, Take 1 tablet (1,000 mcg total) by mouth daily., Disp: 30 tablet, Rfl: 0  Review of Systems  Constitutional: Positive for fatigue. Negative for appetite change, chills and fever.  HENT: Positive for sneezing.   Respiratory: Positive for cough and  shortness of breath. Negative for chest tightness.   Cardiovascular: Negative for chest pain and palpitations.  Gastrointestinal: Negative for abdominal pain, nausea and vomiting.  Neurological: Positive for weakness. Negative for dizziness.    Social History   Tobacco Use  . Smoking status: Current Every Day Smoker    Packs/day: 1.00    Years: 50.00    Pack years: 50.00    Types: Cigarettes  . Smokeless tobacco: Never Used  . Tobacco comment: Previously smoked 2 ppd  Substance Use Topics  . Alcohol use: No    Alcohol/week: 0.0 standard drinks      Objective:   BP 108/63 (BP Location: Right Arm, Patient Position: Sitting, Cuff Size: Normal)   Pulse 92   Temp 97.7 F (36.5 C) (Oral)   Resp 18   SpO2 (!) 88% Comment: room air Vitals:   11/10/18 0810  BP: 108/63  Pulse: 92  Resp: 18  Temp: 97.7 F (36.5 C)  TempSrc: Oral  SpO2: (!) 88%     Physical Exam   General Appearance:    Alert, cooperative, no distress, thin and frail appearing.   Eyes:    PERRL, conjunctiva/corneas clear, EOM's intact       Lungs:     Clear to auscultation bilaterally, respirations unlabored  Heart:    Regular rate and rhythm  Neurologic:   Awake, alert, oriented x 3. No apparent focal neurological           defect.           Assessment & Plan    1. Pneumonia due to infectious organism, unspecified laterality, unspecified part of lung Clinically resolved.   2. COPD with acute exacerbation (HCC) Back to baseline.   3. Vitamin D deficiency   4. Hyperlipidemia, unspecified hyperlipidemia type She is tolerating atorvastatin well with no adverse effects.    5. Need for pneumococcal vaccination  - Pneumococcal polysaccharide vaccine 23-valent greater than or equal to 2yo subcutaneous/IM  6. 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 Is to follow up with cardiology later this week as scheduled.   7.  Tobacco abuse Extensively counseled on role of smoking in her health problems and frequent hospitalizations, however she states she still has no intention of quitting. Counseled on potential benefit of smoking cessation medications and given prescription for chantix to fill when she decides she is ready to quit.      Lelon Huh, MD  [facility]

## 2018-11-10 NOTE — Patient Instructions (Signed)
   All of your medical problems are made worse by smoking. If you start taking Chantix it will be much easier to cut back on cigarettes and will probably keep you from having to be in the hospital so much.    Cut Xanax back to 1/2 tablet at bedtime   You need to wear oxygen to keep you oxygen levels above 90%. Otherwise the low oxygen levels damage your heart

## 2018-11-11 ENCOUNTER — Telehealth: Payer: Self-pay | Admitting: *Deleted

## 2018-11-11 ENCOUNTER — Encounter: Payer: Self-pay | Admitting: Family Medicine

## 2018-11-11 DIAGNOSIS — D509 Iron deficiency anemia, unspecified: Secondary | ICD-10-CM | POA: Diagnosis not present

## 2018-11-11 DIAGNOSIS — I10 Essential (primary) hypertension: Secondary | ICD-10-CM | POA: Diagnosis not present

## 2018-11-11 DIAGNOSIS — M4180 Other forms of scoliosis, site unspecified: Secondary | ICD-10-CM | POA: Diagnosis not present

## 2018-11-11 DIAGNOSIS — G609 Hereditary and idiopathic neuropathy, unspecified: Secondary | ICD-10-CM | POA: Diagnosis not present

## 2018-11-11 DIAGNOSIS — I2510 Atherosclerotic heart disease of native coronary artery without angina pectoris: Secondary | ICD-10-CM | POA: Diagnosis not present

## 2018-11-11 DIAGNOSIS — I739 Peripheral vascular disease, unspecified: Secondary | ICD-10-CM | POA: Diagnosis not present

## 2018-11-11 DIAGNOSIS — J449 Chronic obstructive pulmonary disease, unspecified: Secondary | ICD-10-CM | POA: Diagnosis not present

## 2018-11-11 DIAGNOSIS — F17210 Nicotine dependence, cigarettes, uncomplicated: Secondary | ICD-10-CM | POA: Diagnosis not present

## 2018-11-11 DIAGNOSIS — G8929 Other chronic pain: Secondary | ICD-10-CM | POA: Diagnosis not present

## 2018-11-11 NOTE — Telephone Encounter (Signed)
Tried calling; no answer.   Thanks,   -Lewellyn Fultz  

## 2018-11-11 NOTE — Telephone Encounter (Signed)
That's fine

## 2018-11-11 NOTE — Telephone Encounter (Signed)
[facility]. Requesting home psychical therapy x2 a week for 4 weeks. Please advise?

## 2018-11-11 NOTE — Telephone Encounter (Signed)
This encounter was created in error - please disregard.

## 2018-11-12 DIAGNOSIS — I739 Peripheral vascular disease, unspecified: Secondary | ICD-10-CM | POA: Diagnosis not present

## 2018-11-12 DIAGNOSIS — K219 Gastro-esophageal reflux disease without esophagitis: Secondary | ICD-10-CM | POA: Diagnosis not present

## 2018-11-12 DIAGNOSIS — I420 Dilated cardiomyopathy: Secondary | ICD-10-CM | POA: Diagnosis not present

## 2018-11-12 DIAGNOSIS — R0602 Shortness of breath: Secondary | ICD-10-CM | POA: Diagnosis not present

## 2018-11-12 DIAGNOSIS — F17218 Nicotine dependence, cigarettes, with other nicotine-induced disorders: Secondary | ICD-10-CM | POA: Diagnosis not present

## 2018-11-12 DIAGNOSIS — I2510 Atherosclerotic heart disease of native coronary artery without angina pectoris: Secondary | ICD-10-CM | POA: Diagnosis not present

## 2018-11-12 DIAGNOSIS — I10 Essential (primary) hypertension: Secondary | ICD-10-CM | POA: Diagnosis not present

## 2018-11-13 DIAGNOSIS — G609 Hereditary and idiopathic neuropathy, unspecified: Secondary | ICD-10-CM | POA: Diagnosis not present

## 2018-11-13 DIAGNOSIS — I2510 Atherosclerotic heart disease of native coronary artery without angina pectoris: Secondary | ICD-10-CM | POA: Diagnosis not present

## 2018-11-13 DIAGNOSIS — I739 Peripheral vascular disease, unspecified: Secondary | ICD-10-CM | POA: Diagnosis not present

## 2018-11-13 DIAGNOSIS — I10 Essential (primary) hypertension: Secondary | ICD-10-CM | POA: Diagnosis not present

## 2018-11-13 DIAGNOSIS — M4180 Other forms of scoliosis, site unspecified: Secondary | ICD-10-CM | POA: Diagnosis not present

## 2018-11-13 DIAGNOSIS — D509 Iron deficiency anemia, unspecified: Secondary | ICD-10-CM | POA: Diagnosis not present

## 2018-11-13 DIAGNOSIS — F17210 Nicotine dependence, cigarettes, uncomplicated: Secondary | ICD-10-CM | POA: Diagnosis not present

## 2018-11-13 DIAGNOSIS — G8929 Other chronic pain: Secondary | ICD-10-CM | POA: Diagnosis not present

## 2018-11-13 DIAGNOSIS — J449 Chronic obstructive pulmonary disease, unspecified: Secondary | ICD-10-CM | POA: Diagnosis not present

## 2018-11-13 NOTE — Telephone Encounter (Signed)
Advised Chris as below.  

## 2018-11-15 DIAGNOSIS — J449 Chronic obstructive pulmonary disease, unspecified: Secondary | ICD-10-CM | POA: Diagnosis not present

## 2018-11-16 ENCOUNTER — Other Ambulatory Visit: Payer: Self-pay | Admitting: Family Medicine

## 2018-11-16 DIAGNOSIS — I10 Essential (primary) hypertension: Secondary | ICD-10-CM | POA: Diagnosis not present

## 2018-11-16 DIAGNOSIS — D509 Iron deficiency anemia, unspecified: Secondary | ICD-10-CM | POA: Diagnosis not present

## 2018-11-16 DIAGNOSIS — F17210 Nicotine dependence, cigarettes, uncomplicated: Secondary | ICD-10-CM | POA: Diagnosis not present

## 2018-11-16 DIAGNOSIS — G8929 Other chronic pain: Secondary | ICD-10-CM | POA: Diagnosis not present

## 2018-11-16 DIAGNOSIS — M4180 Other forms of scoliosis, site unspecified: Secondary | ICD-10-CM | POA: Diagnosis not present

## 2018-11-16 DIAGNOSIS — J449 Chronic obstructive pulmonary disease, unspecified: Secondary | ICD-10-CM | POA: Diagnosis not present

## 2018-11-16 DIAGNOSIS — I2510 Atherosclerotic heart disease of native coronary artery without angina pectoris: Secondary | ICD-10-CM | POA: Diagnosis not present

## 2018-11-16 DIAGNOSIS — G609 Hereditary and idiopathic neuropathy, unspecified: Secondary | ICD-10-CM | POA: Diagnosis not present

## 2018-11-16 DIAGNOSIS — I739 Peripheral vascular disease, unspecified: Secondary | ICD-10-CM | POA: Diagnosis not present

## 2018-11-17 DIAGNOSIS — F17210 Nicotine dependence, cigarettes, uncomplicated: Secondary | ICD-10-CM | POA: Diagnosis not present

## 2018-11-17 DIAGNOSIS — M4180 Other forms of scoliosis, site unspecified: Secondary | ICD-10-CM | POA: Diagnosis not present

## 2018-11-17 DIAGNOSIS — G8929 Other chronic pain: Secondary | ICD-10-CM | POA: Diagnosis not present

## 2018-11-17 DIAGNOSIS — I10 Essential (primary) hypertension: Secondary | ICD-10-CM | POA: Diagnosis not present

## 2018-11-17 DIAGNOSIS — I2510 Atherosclerotic heart disease of native coronary artery without angina pectoris: Secondary | ICD-10-CM | POA: Diagnosis not present

## 2018-11-17 DIAGNOSIS — J449 Chronic obstructive pulmonary disease, unspecified: Secondary | ICD-10-CM | POA: Diagnosis not present

## 2018-11-17 DIAGNOSIS — I739 Peripheral vascular disease, unspecified: Secondary | ICD-10-CM | POA: Diagnosis not present

## 2018-11-17 DIAGNOSIS — G609 Hereditary and idiopathic neuropathy, unspecified: Secondary | ICD-10-CM | POA: Diagnosis not present

## 2018-11-17 DIAGNOSIS — D509 Iron deficiency anemia, unspecified: Secondary | ICD-10-CM | POA: Diagnosis not present

## 2018-11-20 ENCOUNTER — Telehealth: Payer: Self-pay | Admitting: Family Medicine

## 2018-11-20 DIAGNOSIS — I2510 Atherosclerotic heart disease of native coronary artery without angina pectoris: Secondary | ICD-10-CM | POA: Diagnosis not present

## 2018-11-20 DIAGNOSIS — G609 Hereditary and idiopathic neuropathy, unspecified: Secondary | ICD-10-CM | POA: Diagnosis not present

## 2018-11-20 DIAGNOSIS — I739 Peripheral vascular disease, unspecified: Secondary | ICD-10-CM | POA: Diagnosis not present

## 2018-11-20 DIAGNOSIS — G8929 Other chronic pain: Secondary | ICD-10-CM | POA: Diagnosis not present

## 2018-11-20 DIAGNOSIS — F17210 Nicotine dependence, cigarettes, uncomplicated: Secondary | ICD-10-CM | POA: Diagnosis not present

## 2018-11-20 DIAGNOSIS — M4180 Other forms of scoliosis, site unspecified: Secondary | ICD-10-CM | POA: Diagnosis not present

## 2018-11-20 DIAGNOSIS — I10 Essential (primary) hypertension: Secondary | ICD-10-CM | POA: Diagnosis not present

## 2018-11-20 DIAGNOSIS — D509 Iron deficiency anemia, unspecified: Secondary | ICD-10-CM | POA: Diagnosis not present

## 2018-11-20 DIAGNOSIS — J449 Chronic obstructive pulmonary disease, unspecified: Secondary | ICD-10-CM | POA: Diagnosis not present

## 2018-11-20 MED ORDER — LEVOFLOXACIN 750 MG PO TABS: 750.0000 mg | ORAL_TABLET | Freq: Every day | ORAL | 0 refills | Status: AC

## 2018-11-20 NOTE — Telephone Encounter (Signed)
[facility]

## 2018-11-20 NOTE — Telephone Encounter (Signed)
Thayer Ohmhris, PT w/- [facility] - [phone]  Report:  99.6 - low grade fever 110-120 resting rate 96 % - oxygen Spitting yellowish - green phlegm   Pt asking if Dr. Sherrie MustacheFisher would possibly call in an antibiotic for pt.  Please advise.  Thanks, [facility]

## 2018-11-20 NOTE — Telephone Encounter (Signed)
Advised patient's husband of results.

## 2018-11-20 NOTE — Telephone Encounter (Signed)
Please advise 

## 2018-11-23 DIAGNOSIS — I10 Essential (primary) hypertension: Secondary | ICD-10-CM | POA: Diagnosis not present

## 2018-11-23 DIAGNOSIS — G609 Hereditary and idiopathic neuropathy, unspecified: Secondary | ICD-10-CM | POA: Diagnosis not present

## 2018-11-23 DIAGNOSIS — I739 Peripheral vascular disease, unspecified: Secondary | ICD-10-CM | POA: Diagnosis not present

## 2018-11-23 DIAGNOSIS — R4182 Altered mental status, unspecified: Secondary | ICD-10-CM | POA: Diagnosis not present

## 2018-11-23 DIAGNOSIS — M4180 Other forms of scoliosis, site unspecified: Secondary | ICD-10-CM | POA: Diagnosis not present

## 2018-11-23 DIAGNOSIS — G8929 Other chronic pain: Secondary | ICD-10-CM | POA: Diagnosis not present

## 2018-11-23 DIAGNOSIS — F17210 Nicotine dependence, cigarettes, uncomplicated: Secondary | ICD-10-CM | POA: Diagnosis not present

## 2018-11-23 DIAGNOSIS — J449 Chronic obstructive pulmonary disease, unspecified: Secondary | ICD-10-CM | POA: Diagnosis not present

## 2018-11-23 DIAGNOSIS — D509 Iron deficiency anemia, unspecified: Secondary | ICD-10-CM | POA: Diagnosis not present

## 2018-11-23 DIAGNOSIS — I2510 Atherosclerotic heart disease of native coronary artery without angina pectoris: Secondary | ICD-10-CM | POA: Diagnosis not present

## 2018-11-23 DIAGNOSIS — J189 Pneumonia, unspecified organism: Secondary | ICD-10-CM | POA: Diagnosis not present

## 2018-11-23 DIAGNOSIS — M6281 Muscle weakness (generalized): Secondary | ICD-10-CM | POA: Diagnosis not present

## 2018-11-24 DIAGNOSIS — M4180 Other forms of scoliosis, site unspecified: Secondary | ICD-10-CM | POA: Diagnosis not present

## 2018-11-24 DIAGNOSIS — D509 Iron deficiency anemia, unspecified: Secondary | ICD-10-CM | POA: Diagnosis not present

## 2018-11-24 DIAGNOSIS — G8929 Other chronic pain: Secondary | ICD-10-CM | POA: Diagnosis not present

## 2018-11-24 DIAGNOSIS — J449 Chronic obstructive pulmonary disease, unspecified: Secondary | ICD-10-CM | POA: Diagnosis not present

## 2018-11-24 DIAGNOSIS — I10 Essential (primary) hypertension: Secondary | ICD-10-CM | POA: Diagnosis not present

## 2018-11-24 DIAGNOSIS — I739 Peripheral vascular disease, unspecified: Secondary | ICD-10-CM | POA: Diagnosis not present

## 2018-11-24 DIAGNOSIS — I2510 Atherosclerotic heart disease of native coronary artery without angina pectoris: Secondary | ICD-10-CM | POA: Diagnosis not present

## 2018-11-24 DIAGNOSIS — G609 Hereditary and idiopathic neuropathy, unspecified: Secondary | ICD-10-CM | POA: Diagnosis not present

## 2018-11-24 DIAGNOSIS — F17210 Nicotine dependence, cigarettes, uncomplicated: Secondary | ICD-10-CM | POA: Diagnosis not present

## 2018-11-25 DIAGNOSIS — G609 Hereditary and idiopathic neuropathy, unspecified: Secondary | ICD-10-CM | POA: Diagnosis not present

## 2018-11-25 DIAGNOSIS — M4180 Other forms of scoliosis, site unspecified: Secondary | ICD-10-CM | POA: Diagnosis not present

## 2018-11-25 DIAGNOSIS — I10 Essential (primary) hypertension: Secondary | ICD-10-CM | POA: Diagnosis not present

## 2018-11-25 DIAGNOSIS — F17210 Nicotine dependence, cigarettes, uncomplicated: Secondary | ICD-10-CM | POA: Diagnosis not present

## 2018-11-25 DIAGNOSIS — D509 Iron deficiency anemia, unspecified: Secondary | ICD-10-CM | POA: Diagnosis not present

## 2018-11-25 DIAGNOSIS — I739 Peripheral vascular disease, unspecified: Secondary | ICD-10-CM | POA: Diagnosis not present

## 2018-11-25 DIAGNOSIS — J449 Chronic obstructive pulmonary disease, unspecified: Secondary | ICD-10-CM | POA: Diagnosis not present

## 2018-11-25 DIAGNOSIS — I2510 Atherosclerotic heart disease of native coronary artery without angina pectoris: Secondary | ICD-10-CM | POA: Diagnosis not present

## 2018-11-25 DIAGNOSIS — G8929 Other chronic pain: Secondary | ICD-10-CM | POA: Diagnosis not present

## 2018-11-27 DIAGNOSIS — M4180 Other forms of scoliosis, site unspecified: Secondary | ICD-10-CM | POA: Diagnosis not present

## 2018-11-27 DIAGNOSIS — F17210 Nicotine dependence, cigarettes, uncomplicated: Secondary | ICD-10-CM | POA: Diagnosis not present

## 2018-11-27 DIAGNOSIS — J449 Chronic obstructive pulmonary disease, unspecified: Secondary | ICD-10-CM | POA: Diagnosis not present

## 2018-11-27 DIAGNOSIS — I2510 Atherosclerotic heart disease of native coronary artery without angina pectoris: Secondary | ICD-10-CM | POA: Diagnosis not present

## 2018-11-27 DIAGNOSIS — I10 Essential (primary) hypertension: Secondary | ICD-10-CM | POA: Diagnosis not present

## 2018-11-27 DIAGNOSIS — G8929 Other chronic pain: Secondary | ICD-10-CM | POA: Diagnosis not present

## 2018-11-27 DIAGNOSIS — I739 Peripheral vascular disease, unspecified: Secondary | ICD-10-CM | POA: Diagnosis not present

## 2018-11-27 DIAGNOSIS — D509 Iron deficiency anemia, unspecified: Secondary | ICD-10-CM | POA: Diagnosis not present

## 2018-11-27 DIAGNOSIS — G609 Hereditary and idiopathic neuropathy, unspecified: Secondary | ICD-10-CM | POA: Diagnosis not present

## 2018-11-30 DIAGNOSIS — I2510 Atherosclerotic heart disease of native coronary artery without angina pectoris: Secondary | ICD-10-CM | POA: Diagnosis not present

## 2018-11-30 DIAGNOSIS — G609 Hereditary and idiopathic neuropathy, unspecified: Secondary | ICD-10-CM | POA: Diagnosis not present

## 2018-11-30 DIAGNOSIS — I739 Peripheral vascular disease, unspecified: Secondary | ICD-10-CM | POA: Diagnosis not present

## 2018-11-30 DIAGNOSIS — F17210 Nicotine dependence, cigarettes, uncomplicated: Secondary | ICD-10-CM | POA: Diagnosis not present

## 2018-11-30 DIAGNOSIS — J449 Chronic obstructive pulmonary disease, unspecified: Secondary | ICD-10-CM | POA: Diagnosis not present

## 2018-11-30 DIAGNOSIS — M4180 Other forms of scoliosis, site unspecified: Secondary | ICD-10-CM | POA: Diagnosis not present

## 2018-11-30 DIAGNOSIS — D509 Iron deficiency anemia, unspecified: Secondary | ICD-10-CM | POA: Diagnosis not present

## 2018-11-30 DIAGNOSIS — G8929 Other chronic pain: Secondary | ICD-10-CM | POA: Diagnosis not present

## 2018-11-30 DIAGNOSIS — I10 Essential (primary) hypertension: Secondary | ICD-10-CM | POA: Diagnosis not present

## 2018-12-01 ENCOUNTER — Encounter: Payer: Self-pay | Admitting: Family Medicine

## 2018-12-01 ENCOUNTER — Ambulatory Visit (INDEPENDENT_AMBULATORY_CARE_PROVIDER_SITE_OTHER): Payer: Medicare HMO | Admitting: Family Medicine

## 2018-12-01 VITALS — BP 136/66 | HR 84 | Temp 98.6°F | Resp 16 | Wt 110.0 lb

## 2018-12-01 DIAGNOSIS — D649 Anemia, unspecified: Secondary | ICD-10-CM | POA: Diagnosis not present

## 2018-12-01 DIAGNOSIS — M353 Polymyalgia rheumatica: Secondary | ICD-10-CM

## 2018-12-01 DIAGNOSIS — E876 Hypokalemia: Secondary | ICD-10-CM | POA: Diagnosis not present

## 2018-12-01 DIAGNOSIS — E871 Hypo-osmolality and hyponatremia: Secondary | ICD-10-CM | POA: Diagnosis not present

## 2018-12-01 MED ORDER — PREDNISONE 10 MG PO TABS: ORAL_TABLET | ORAL | 0 refills | Status: AC

## 2018-12-01 NOTE — Patient Instructions (Signed)
.   Please bring all of your medications to every appointment so we can make sure that our medication list is the same as yours.   

## 2018-12-01 NOTE — Progress Notes (Signed)
Patient: Cheryl [name] Female    DOB: 1951/05/20   68 y.o.   MRN: [id number] Visit Date: 12/01/2018  Today's Provider: Lelon Huh, MD   Chief Complaint  Patient presents with  . Arm Pain    Right arm started about 3-4 weeks ago  . Leg Pain    Bilateral; Started about 3 days ago.    Subjective:     Leg Pain   The incident occurred 5 to 7 days ago. There was no injury mechanism. The pain is present in the left leg and right leg. The quality of the pain is described as aching. The pain has been constant since onset. Associated symptoms include an inability to bear weight and muscle weakness. Pertinent negatives include no loss of motion, loss of sensation, numbness or tingling. She has tried acetaminophen for the symptoms. The treatment provided mild relief.  Arm Pain   Incident onset: Started about three weeks ago. There was no injury mechanism. The quality of the pain is described as aching. The pain has been worsening since the incident. Associated symptoms include muscle weakness. Pertinent negatives include no chest pain, numbness or tingling. She has tried acetaminophen for the symptoms. The treatment provided mild relief.  Denies any know injury with sx progressing gradually over the last week.   She also reports she is still having cough productive clear sputum. No worsening dyspnea of fever.  She is noted to have been hypokalemic and anemic at ER last ER visit in December.   Allergies  Allergen Reactions  . Percocet [Oxycodone-Acetaminophen] Hives and Rash  . Aspirin Nausea And Vomiting and Other (See Comments)  . Codeine Nausea And Vomiting, Nausea Only and Other (See Comments)  . Propoxyphene Nausea Only and Other (See Comments)  . Sulfa Antibiotics Rash and Other (See Comments)     Current Outpatient Medications:  .  acetaminophen (TYLENOL) 325 MG tablet, Take 2 tablets (650 mg total) by mouth every 6 (six) hours as needed for mild pain (or Fever >/= 101)., Disp:  , Rfl:  .  albuterol (PROVENTIL) (2.5 MG/3ML) 0.083% nebulizer solution, Take 3 mLs (2.5 mg total) by nebulization every 4 (four) hours as needed for wheezing., Disp: 50 vial, Rfl: 12 .  alendronate (FOSAMAX) 70 MG tablet, Take 1 tablet (70 mg total) by mouth every 7 (seven) days. Take with a full glass of water on an empty stomach. (Patient taking differently: Take 70 mg by mouth every Monday. Take with a full glass of water on an empty stomach.), Disp: 4 tablet, Rfl: 11 .  ALPRAZolam (XANAX) 0.5 MG tablet, Take 0.5-1 tablets (0.25-0.5 mg total) by mouth at bedtime as needed for anxiety., Disp: 30 tablet, Rfl: 5 .  atorvastatin (LIPITOR) 40 MG tablet, Take 1 tablet (40 mg total) by mouth daily at 6 PM., Disp: 30 tablet, Rfl: 1 .  baclofen (LIORESAL) 10 MG tablet, Take 1 tablet (10 mg total) by mouth 3 (three) times daily as needed for muscle spasms., Disp: 30 tablet, Rfl: 0 .  BELSOMRA 5 MG TABS, Take 1 tablet by mouth at bedtime as needed (for insomnia)., Disp: 30 tablet, Rfl: 5 .  benzonatate (TESSALON PERLES) 100 MG capsule, Take 1 capsule (100 mg total) by mouth 3 (three) times daily as needed for cough., Disp: 20 capsule, Rfl: 3 .  clopidogrel (PLAVIX) 75 MG tablet, Take 75 mg by mouth daily., Disp: , Rfl:  .  diphenoxylate-atropine (LOMOTIL) 2.5-0.025 MG tablet, Take 1 tablet  by mouth as needed for diarrhea or loose stools., Disp: , Rfl:  .  estradiol (ESTRACE) 0.5 MG tablet, Take 1 tablet (0.5 mg total) by mouth every other day., Disp: 45 tablet, Rfl: 2 .  fentaNYL (DURAGESIC - DOSED MCG/HR) 50 MCG/HR, Place 1 patch (50 mcg total) onto the skin every 3 (three) days., Disp: 10 patch, Rfl: 0 .  ferrous sulfate 325 (65 FE) MG tablet, Take 1 tablet (325 mg total) by mouth 2 (two) times daily with a meal., Disp: 60 tablet, Rfl: 3 .  Fluticasone-Salmeterol (ADVAIR DISKUS) 250-50 MCG/DOSE AEPB, TAKE ONE PUFF TWICE DAILY., Disp: 60 each, Rfl: 5 .  furosemide (LASIX) 20 MG tablet, Take 1 tablet (20 mg  total) by mouth every other day., Disp: 30 tablet, Rfl: 0 .  guaiFENesin (ROBITUSSIN) 100 MG/5ML SOLN, Take 5 mLs (100 mg total) by mouth every 4 (four) hours as needed for cough or to loosen phlegm., Disp: 236 mL, Rfl: 0 .  memantine (NAMENDA) 5 MG tablet, Take 5 mg by mouth 2 (two) times daily., Disp: , Rfl:  .  NARCAN 4 MG/0.1ML LIQD nasal spray kit, Place 0.4 mg into the nose daily as needed (for accidental overdose.). , Disp: , Rfl:  .  ondansetron (ZOFRAN) 4 MG tablet, Take 4 mg by mouth as needed., Disp: , Rfl:  .  pantoprazole (PROTONIX) 40 MG tablet, Take 40 mg by mouth 2 (two) times daily. Take 1 tablet (40 mg) by mouth scheduled every morning, may repeat dose in evening if needed for heartburn/indigestion., Disp: , Rfl:  .  pregabalin (LYRICA) 300 MG capsule, Take 300 mg by mouth 2 (two) times daily., Disp: , Rfl:  .  silver sulfADIAZINE (SILVADENE) 1 % cream, Apply 1 application topically daily as needed (leg infection). , Disp: , Rfl:  .  tiotropium (SPIRIVA HANDIHALER) 18 MCG inhalation capsule, inhale the contents of one capsule in the handihaler once daily (Patient taking differently: Place 18 mcg into inhaler and inhale daily. inhale the contents of one capsule in the handihaler once daily), Disp: 30 capsule, Rfl: 12 .  triamcinolone (KENALOG) 0.025 % ointment, Apply 1 application topically 2 (two) times daily. (Patient taking differently: Apply 1 application topically 2 (two) times daily as needed (cracking skin). ), Disp: 15 g, Rfl: 0 .  VENTOLIN HFA 108 (90 Base) MCG/ACT inhaler, Inhale 2 puffs into the lungs every 4 (four) hours as needed for wheezing or shortness of breath. (Patient taking differently: Inhale 2 puffs into the lungs every 4 (four) hours as needed for wheezing or shortness of breath. ), Disp: 18 g, Rfl: 5 .  vitamin B-12 (CYANOCOBALAMIN) 1000 MCG tablet, Take 1 tablet (1,000 mcg total) by mouth daily., Disp: 30 tablet, Rfl: 0 .  amoxicillin-clavulanate (AUGMENTIN)  875-125 MG tablet, Take 1 tablet by mouth every 12 (twelve) hours., Disp: 8 tablet, Rfl: 0 .  varenicline (CHANTIX STARTING MONTH PAK) 0.5 MG X 11 & 1 MG X 42 tablet, Take one 0.5 mg tablet daily for 3 days, then take one 0.5 mg tablet twice daily for 4 days, then take one 1 mg tablet twice daily (Patient not taking: Reported on 12/01/2018), Disp: 53 tablet, Rfl: 1  Review of Systems  Constitutional: Positive for fatigue (More fatigued ). Negative for activity change, appetite change, chills, diaphoresis, fever and unexpected weight change.  Respiratory: Positive for cough and wheezing. Negative for apnea, choking, chest tightness, shortness of breath and stridor.        Pt is  needing to use her O2 more often.   Cardiovascular: Negative.  Negative for chest pain.  Gastrointestinal: Negative.   Musculoskeletal: Positive for arthralgias, gait problem and myalgias. Negative for back pain, joint swelling, neck pain and neck stiffness.  Neurological: Positive for weakness. Negative for dizziness, tingling, light-headedness, numbness and headaches.    Social History   Tobacco Use  . Smoking status: Current Every Day Smoker    Packs/day: 1.00    Years: 50.00    Pack years: 50.00    Types: Cigarettes  . Smokeless tobacco: Never Used  . Tobacco comment: Previously smoked 2 ppd  Substance Use Topics  . Alcohol use: No    Alcohol/week: 0.0 standard drinks      Objective:   BP 136/66 (BP Location: Left Arm, Patient Position: Sitting, Cuff Size: Normal)   Pulse 84   Temp 98.6 F (37 C) (Oral)   Resp 16   Wt 110 lb (49.9 kg) Comment: Per pt  BMI 22.99 kg/m  Vitals:   12/01/18 1104  BP: 136/66  Pulse: 84  Resp: 16  Temp: 98.6 F (37 C)  TempSrc: Oral  Weight: 110 lb (49.9 kg)    Physical Exam   General Appearance:    Alert, cooperative, no distress  Eyes:    PERRL, conjunctiva/corneas clear, EOM's intact       Lungs:     Clear to auscultation bilaterally, respirations unlabored    Heart:    Regular rate and rhythm  MS:   Diffusely tender along muscle of both upper and lower extremities, LUE>RUE. Pain at limits of ROM. No gross deformities.          Assessment & Plan    1. 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  Dispense: 30 tablet; Refill: 0 - Sed Rate (ESR)  2. Anemia, unspecified type  - CBC  3. Hypokalemia  - Comprehensive metabolic panel     Lelon Huh, MD  [facility]

## 2018-12-02 ENCOUNTER — Telehealth: Payer: Self-pay

## 2018-12-02 DIAGNOSIS — G8929 Other chronic pain: Secondary | ICD-10-CM | POA: Diagnosis not present

## 2018-12-02 DIAGNOSIS — G609 Hereditary and idiopathic neuropathy, unspecified: Secondary | ICD-10-CM | POA: Diagnosis not present

## 2018-12-02 DIAGNOSIS — I10 Essential (primary) hypertension: Secondary | ICD-10-CM | POA: Diagnosis not present

## 2018-12-02 DIAGNOSIS — M4180 Other forms of scoliosis, site unspecified: Secondary | ICD-10-CM | POA: Diagnosis not present

## 2018-12-02 DIAGNOSIS — F17210 Nicotine dependence, cigarettes, uncomplicated: Secondary | ICD-10-CM | POA: Diagnosis not present

## 2018-12-02 DIAGNOSIS — I2510 Atherosclerotic heart disease of native coronary artery without angina pectoris: Secondary | ICD-10-CM | POA: Diagnosis not present

## 2018-12-02 DIAGNOSIS — D509 Iron deficiency anemia, unspecified: Secondary | ICD-10-CM | POA: Diagnosis not present

## 2018-12-02 DIAGNOSIS — J449 Chronic obstructive pulmonary disease, unspecified: Secondary | ICD-10-CM | POA: Diagnosis not present

## 2018-12-02 DIAGNOSIS — I739 Peripheral vascular disease, unspecified: Secondary | ICD-10-CM | POA: Diagnosis not present

## 2018-12-02 LAB — COMPREHENSIVE METABOLIC PANEL
ALT: 5 IU/L (ref 0–32)
AST: 10 IU/L (ref 0–40)
Albumin/Globulin Ratio: 1.4 (ref 1.2–2.2)
Albumin: 3.5 g/dL — ABNORMAL LOW (ref 3.6–4.8)
Alkaline Phosphatase: 73 IU/L (ref 39–117)
BUN/Creatinine Ratio: 18 (ref 12–28)
BUN: 13 mg/dL (ref 8–27)
Bilirubin Total: 0.3 mg/dL (ref 0.0–1.2)
CALCIUM: 8.8 mg/dL (ref 8.7–10.3)
CO2: 27 mmol/L (ref 20–29)
CREATININE: 0.73 mg/dL (ref 0.57–1.00)
Chloride: 102 mmol/L (ref 96–106)
GFR calc non Af Amer: 84 mL/min/{1.73_m2} (ref >59)
GFR, EST AFRICAN AMERICAN: 97 mL/min/{1.73_m2} (ref >59)
Globulin, Total: 2.5 g/dL (ref 1.5–4.5)
Glucose: 83 mg/dL (ref 65–99)
Potassium: 5.3 mmol/L — ABNORMAL HIGH (ref 3.5–5.2)
Sodium: 143 mmol/L (ref 134–144)
TOTAL PROTEIN: 6 g/dL (ref 6.0–8.5)

## 2018-12-02 LAB — CBC
Hematocrit: 33 % — ABNORMAL LOW (ref 34.0–46.6)
Hemoglobin: 10.7 g/dL — ABNORMAL LOW (ref 11.1–15.9)
MCH: 30.3 pg (ref 26.6–33.0)
MCHC: 32.4 g/dL (ref 31.5–35.7)
MCV: 94 fL (ref 79–97)
Platelets: 228 10*3/uL (ref 150–450)
RBC: 3.53 x10E6/uL — ABNORMAL LOW (ref 3.77–5.28)
RDW: 14.3 % (ref 11.7–15.4)
WBC: 4.4 10*3/uL (ref 3.4–10.8)

## 2018-12-02 LAB — SEDIMENTATION RATE: Sed Rate: 44 mm/hr — ABNORMAL HIGH (ref 0–40)

## 2018-12-02 NOTE — Telephone Encounter (Signed)
Tried calling; no answer.   Thanks,   -Laura  

## 2018-12-02 NOTE — Telephone Encounter (Signed)
Spoke to pt's husband and advised lab results and scheduled appt on 12/15/18 at 11am

## 2018-12-02 NOTE — Telephone Encounter (Signed)
-----   Message from Malva Limesonald E Fisher, MD sent at 12/02/2018  7:45 AM EST ----- Hemoglobin is improving, up to 107. Sed rate is elevated which is consistent with polymyalgia. Continue prednisone that was prescribed. Follow up in 10 days.

## 2018-12-04 ENCOUNTER — Other Ambulatory Visit: Payer: Self-pay | Admitting: Family Medicine

## 2018-12-04 ENCOUNTER — Telehealth: Payer: Self-pay | Admitting: Family Medicine

## 2018-12-04 DIAGNOSIS — D509 Iron deficiency anemia, unspecified: Secondary | ICD-10-CM | POA: Diagnosis not present

## 2018-12-04 DIAGNOSIS — I2510 Atherosclerotic heart disease of native coronary artery without angina pectoris: Secondary | ICD-10-CM | POA: Diagnosis not present

## 2018-12-04 DIAGNOSIS — I739 Peripheral vascular disease, unspecified: Secondary | ICD-10-CM | POA: Diagnosis not present

## 2018-12-04 DIAGNOSIS — M810 Age-related osteoporosis without current pathological fracture: Secondary | ICD-10-CM

## 2018-12-04 DIAGNOSIS — J449 Chronic obstructive pulmonary disease, unspecified: Secondary | ICD-10-CM | POA: Diagnosis not present

## 2018-12-04 DIAGNOSIS — G609 Hereditary and idiopathic neuropathy, unspecified: Secondary | ICD-10-CM | POA: Diagnosis not present

## 2018-12-04 DIAGNOSIS — F17210 Nicotine dependence, cigarettes, uncomplicated: Secondary | ICD-10-CM | POA: Diagnosis not present

## 2018-12-04 DIAGNOSIS — I10 Essential (primary) hypertension: Secondary | ICD-10-CM | POA: Diagnosis not present

## 2018-12-04 DIAGNOSIS — G8929 Other chronic pain: Secondary | ICD-10-CM | POA: Diagnosis not present

## 2018-12-04 DIAGNOSIS — M4180 Other forms of scoliosis, site unspecified: Secondary | ICD-10-CM | POA: Diagnosis not present

## 2018-12-04 NOTE — Telephone Encounter (Signed)
Chris advised.  Thanks,   -Sharifah Champine  

## 2018-12-04 NOTE — Telephone Encounter (Signed)
That's fine

## 2018-12-04 NOTE — Telephone Encounter (Signed)
Thayer OhmChris w/ [phone]- [phone]  Verbal Orders PT  2 time a week for 8 weeks starting next week   Home health - bathing  3 times a week for 8 weeks  Please advise.  Thanks, [facility]

## 2018-12-07 DIAGNOSIS — G8929 Other chronic pain: Secondary | ICD-10-CM | POA: Diagnosis not present

## 2018-12-07 DIAGNOSIS — F17210 Nicotine dependence, cigarettes, uncomplicated: Secondary | ICD-10-CM | POA: Diagnosis not present

## 2018-12-07 DIAGNOSIS — M4180 Other forms of scoliosis, site unspecified: Secondary | ICD-10-CM | POA: Diagnosis not present

## 2018-12-07 DIAGNOSIS — I10 Essential (primary) hypertension: Secondary | ICD-10-CM | POA: Diagnosis not present

## 2018-12-07 DIAGNOSIS — I739 Peripheral vascular disease, unspecified: Secondary | ICD-10-CM | POA: Diagnosis not present

## 2018-12-07 DIAGNOSIS — D509 Iron deficiency anemia, unspecified: Secondary | ICD-10-CM | POA: Diagnosis not present

## 2018-12-07 DIAGNOSIS — J449 Chronic obstructive pulmonary disease, unspecified: Secondary | ICD-10-CM | POA: Diagnosis not present

## 2018-12-07 DIAGNOSIS — G609 Hereditary and idiopathic neuropathy, unspecified: Secondary | ICD-10-CM | POA: Diagnosis not present

## 2018-12-07 DIAGNOSIS — I2510 Atherosclerotic heart disease of native coronary artery without angina pectoris: Secondary | ICD-10-CM | POA: Diagnosis not present

## 2018-12-08 DIAGNOSIS — G609 Hereditary and idiopathic neuropathy, unspecified: Secondary | ICD-10-CM | POA: Diagnosis not present

## 2018-12-08 DIAGNOSIS — G8929 Other chronic pain: Secondary | ICD-10-CM | POA: Diagnosis not present

## 2018-12-08 DIAGNOSIS — M4180 Other forms of scoliosis, site unspecified: Secondary | ICD-10-CM | POA: Diagnosis not present

## 2018-12-08 DIAGNOSIS — J449 Chronic obstructive pulmonary disease, unspecified: Secondary | ICD-10-CM | POA: Diagnosis not present

## 2018-12-08 DIAGNOSIS — I10 Essential (primary) hypertension: Secondary | ICD-10-CM | POA: Diagnosis not present

## 2018-12-08 DIAGNOSIS — I2510 Atherosclerotic heart disease of native coronary artery without angina pectoris: Secondary | ICD-10-CM | POA: Diagnosis not present

## 2018-12-08 DIAGNOSIS — I739 Peripheral vascular disease, unspecified: Secondary | ICD-10-CM | POA: Diagnosis not present

## 2018-12-08 DIAGNOSIS — D509 Iron deficiency anemia, unspecified: Secondary | ICD-10-CM | POA: Diagnosis not present

## 2018-12-08 DIAGNOSIS — F17210 Nicotine dependence, cigarettes, uncomplicated: Secondary | ICD-10-CM | POA: Diagnosis not present

## 2018-12-09 ENCOUNTER — Other Ambulatory Visit: Payer: Self-pay | Admitting: Family Medicine

## 2018-12-09 DIAGNOSIS — G3184 Mild cognitive impairment, so stated: Secondary | ICD-10-CM | POA: Diagnosis not present

## 2018-12-09 DIAGNOSIS — G609 Hereditary and idiopathic neuropathy, unspecified: Secondary | ICD-10-CM | POA: Diagnosis not present

## 2018-12-10 DIAGNOSIS — I10 Essential (primary) hypertension: Secondary | ICD-10-CM | POA: Diagnosis not present

## 2018-12-10 DIAGNOSIS — I2510 Atherosclerotic heart disease of native coronary artery without angina pectoris: Secondary | ICD-10-CM | POA: Diagnosis not present

## 2018-12-10 DIAGNOSIS — D509 Iron deficiency anemia, unspecified: Secondary | ICD-10-CM | POA: Diagnosis not present

## 2018-12-10 DIAGNOSIS — G609 Hereditary and idiopathic neuropathy, unspecified: Secondary | ICD-10-CM | POA: Diagnosis not present

## 2018-12-10 DIAGNOSIS — F17210 Nicotine dependence, cigarettes, uncomplicated: Secondary | ICD-10-CM | POA: Diagnosis not present

## 2018-12-10 DIAGNOSIS — J449 Chronic obstructive pulmonary disease, unspecified: Secondary | ICD-10-CM | POA: Diagnosis not present

## 2018-12-10 DIAGNOSIS — G8929 Other chronic pain: Secondary | ICD-10-CM | POA: Diagnosis not present

## 2018-12-10 DIAGNOSIS — I739 Peripheral vascular disease, unspecified: Secondary | ICD-10-CM | POA: Diagnosis not present

## 2018-12-10 DIAGNOSIS — M4180 Other forms of scoliosis, site unspecified: Secondary | ICD-10-CM | POA: Diagnosis not present

## 2018-12-11 DIAGNOSIS — R0602 Shortness of breath: Secondary | ICD-10-CM | POA: Diagnosis not present

## 2018-12-14 DIAGNOSIS — G609 Hereditary and idiopathic neuropathy, unspecified: Secondary | ICD-10-CM | POA: Diagnosis not present

## 2018-12-14 DIAGNOSIS — I739 Peripheral vascular disease, unspecified: Secondary | ICD-10-CM | POA: Diagnosis not present

## 2018-12-14 DIAGNOSIS — I10 Essential (primary) hypertension: Secondary | ICD-10-CM | POA: Diagnosis not present

## 2018-12-14 DIAGNOSIS — D509 Iron deficiency anemia, unspecified: Secondary | ICD-10-CM | POA: Diagnosis not present

## 2018-12-14 DIAGNOSIS — F17210 Nicotine dependence, cigarettes, uncomplicated: Secondary | ICD-10-CM | POA: Diagnosis not present

## 2018-12-14 DIAGNOSIS — I2510 Atherosclerotic heart disease of native coronary artery without angina pectoris: Secondary | ICD-10-CM | POA: Diagnosis not present

## 2018-12-14 DIAGNOSIS — J449 Chronic obstructive pulmonary disease, unspecified: Secondary | ICD-10-CM | POA: Diagnosis not present

## 2018-12-14 DIAGNOSIS — M4180 Other forms of scoliosis, site unspecified: Secondary | ICD-10-CM | POA: Diagnosis not present

## 2018-12-14 DIAGNOSIS — G8929 Other chronic pain: Secondary | ICD-10-CM | POA: Diagnosis not present

## 2018-12-15 ENCOUNTER — Ambulatory Visit (INDEPENDENT_AMBULATORY_CARE_PROVIDER_SITE_OTHER): Payer: Medicare HMO | Admitting: Family Medicine

## 2018-12-15 ENCOUNTER — Encounter: Payer: Self-pay | Admitting: Family Medicine

## 2018-12-15 ENCOUNTER — Ambulatory Visit
Admission: RE | Admit: 2018-12-15 | Discharge: 2018-12-15 | Disposition: A | Discharge status: Home / Self Care | Payer: Medicare HMO | Source: Ambulatory Visit | Attending: Family Medicine | Admitting: Family Medicine

## 2018-12-15 ENCOUNTER — Ambulatory Visit
Admission: RE | Admit: 2018-12-15 | Discharge: 2018-12-15 | Disposition: A | Discharge status: Home / Self Care | Payer: Medicare HMO | Attending: Family Medicine | Admitting: Family Medicine

## 2018-12-15 VITALS — BP 95/57 | HR 85 | Temp 98.7°F | Resp 16

## 2018-12-15 DIAGNOSIS — F17210 Nicotine dependence, cigarettes, uncomplicated: Secondary | ICD-10-CM | POA: Diagnosis not present

## 2018-12-15 DIAGNOSIS — J189 Pneumonia, unspecified organism: Secondary | ICD-10-CM

## 2018-12-15 DIAGNOSIS — M353 Polymyalgia rheumatica: Secondary | ICD-10-CM

## 2018-12-15 DIAGNOSIS — I739 Peripheral vascular disease, unspecified: Secondary | ICD-10-CM | POA: Diagnosis not present

## 2018-12-15 DIAGNOSIS — I10 Essential (primary) hypertension: Secondary | ICD-10-CM | POA: Diagnosis not present

## 2018-12-15 DIAGNOSIS — I2510 Atherosclerotic heart disease of native coronary artery without angina pectoris: Secondary | ICD-10-CM | POA: Diagnosis not present

## 2018-12-15 DIAGNOSIS — Z23 Encounter for immunization: Secondary | ICD-10-CM | POA: Diagnosis not present

## 2018-12-15 DIAGNOSIS — G609 Hereditary and idiopathic neuropathy, unspecified: Secondary | ICD-10-CM | POA: Diagnosis not present

## 2018-12-15 DIAGNOSIS — J449 Chronic obstructive pulmonary disease, unspecified: Secondary | ICD-10-CM | POA: Diagnosis not present

## 2018-12-15 DIAGNOSIS — G8929 Other chronic pain: Secondary | ICD-10-CM | POA: Diagnosis not present

## 2018-12-15 DIAGNOSIS — M4180 Other forms of scoliosis, site unspecified: Secondary | ICD-10-CM | POA: Diagnosis not present

## 2018-12-15 DIAGNOSIS — D509 Iron deficiency anemia, unspecified: Secondary | ICD-10-CM | POA: Diagnosis not present

## 2018-12-15 MED ORDER — PREDNISONE 10 MG PO TABS: 10.0000 mg | ORAL_TABLET | Freq: Every day | ORAL | 0 refills | Status: DC

## 2018-12-15 NOTE — Progress Notes (Signed)
Patient: Cheryl [name] Female    DOB: [birth year]   68 y.o.   MRN: [id number] Visit Date: 12/15/2018  Today's Provider: Lelon Huh, MD   Chief Complaint  Patient presents with  . Follow-up   Subjective:     HPI  Follow up for Polymyalgia:  The patient was last seen for this 2 weeks ago. Changes made at last visit include starting 10 day prednisone taper due to elevated Sedimentation rate. Lab Results  Component Value Date   ESRSEDRATE 56 (H) 12/01/2018     She reports good compliance with treatment.  She feels that condition is Improved., However she ran out of prednisone a few days ago and leg pains are starting to flare back up.  She is not having side effects.     ------------------------------------------------------------------------------------  Follow up for Pneumonia:  Patient states she was seen by her Cardiologist Dr. Chancy Milroy over 1 week ago. She states he ordered a chest x ray which showed Pneumonia. Patient states she was treated with 10 days of '500mg'$  Levaquin starting on 12/11/2018.  However she states she completed all doses of antibiotic. Patient still has a productive cough and congestion which is a little bit improved. Patient denies any shortness of breath or fever.    ------------------------------------------------------------------------------------    Allergies  Allergen Reactions  . Percocet [Oxycodone-Acetaminophen] Hives and Rash  . Aspirin Nausea And Vomiting and Other (See Comments)  . Codeine Nausea And Vomiting, Nausea Only and Other (See Comments)  . Propoxyphene Nausea Only and Other (See Comments)  . Sulfa Antibiotics Rash and Other (See Comments)     Current Outpatient Medications:  .  acetaminophen (TYLENOL) 325 MG tablet, Take 2 tablets (650 mg total) by mouth every 6 (six) hours as needed for mild pain (or Fever >/= 101)., Disp: , Rfl:  .  albuterol (PROVENTIL) (2.5 MG/3ML) 0.083% nebulizer solution, Take 3 mLs (2.5 mg  total) by nebulization every 4 (four) hours as needed for wheezing., Disp: 50 vial, Rfl: 12 .  alendronate (FOSAMAX) 70 MG tablet, Take 1 tablet (70 mg total) by mouth once a week. Take with a full glass of water on an empty stomach., Disp: 4 tablet, Rfl: 12 .  ALPRAZolam (XANAX) 0.5 MG tablet, Take 0.5-1 tablets (0.25-0.5 mg total) by mouth at bedtime as needed for anxiety., Disp: 30 tablet, Rfl: 5 .  amLODipine (NORVASC) 5 MG tablet, Take 1 tablet (5 mg total) by mouth daily., Disp: 30 tablet, Rfl: 12 .  amoxicillin-clavulanate (AUGMENTIN) 875-125 MG tablet, Take 1 tablet by mouth every 12 (twelve) hours., Disp: 8 tablet, Rfl: 0 .  atorvastatin (LIPITOR) 40 MG tablet, Take 1 tablet (40 mg total) by mouth daily at 6 PM., Disp: 30 tablet, Rfl: 1 .  baclofen (LIORESAL) 10 MG tablet, Take 1 tablet (10 mg total) by mouth 3 (three) times daily as needed for muscle spasms., Disp: 30 tablet, Rfl: 0 .  BELSOMRA 5 MG TABS, Take 1 tablet by mouth at bedtime as needed (for insomnia)., Disp: 30 tablet, Rfl: 5 .  benzonatate (TESSALON PERLES) 100 MG capsule, Take 1 capsule (100 mg total) by mouth 3 (three) times daily as needed for cough., Disp: 20 capsule, Rfl: 3 .  clopidogrel (PLAVIX) 75 MG tablet, Take 75 mg by mouth daily., Disp: , Rfl:  .  diphenoxylate-atropine (LOMOTIL) 2.5-0.025 MG tablet, Take 1 tablet by mouth as needed for diarrhea or loose stools., Disp: , Rfl:  .  estradiol (ESTRACE) 0.5  MG tablet, Take 1 tablet (0.5 mg total) by mouth every other day., Disp: 45 tablet, Rfl: 2 .  famotidine (PEPCID) 20 MG tablet, Take 1 tablet (20 mg total) by mouth daily., Disp: 30 tablet, Rfl: 12 .  fentaNYL (DURAGESIC - DOSED MCG/HR) 50 MCG/HR, Place 1 patch (50 mcg total) onto the skin every 3 (three) days., Disp: 10 patch, Rfl: 0 .  ferrous sulfate 325 (65 FE) MG tablet, Take 1 tablet (325 mg total) by mouth 2 (two) times daily with a meal., Disp: 60 tablet, Rfl: 3 .  Fluticasone-Salmeterol (ADVAIR DISKUS)  250-50 MCG/DOSE AEPB, TAKE ONE PUFF TWICE DAILY., Disp: 60 each, Rfl: 5 .  furosemide (LASIX) 20 MG tablet, Take 1 tablet (20 mg total) by mouth every other day., Disp: 30 tablet, Rfl: 0 .  guaiFENesin (ROBITUSSIN) 100 MG/5ML SOLN, Take 5 mLs (100 mg total) by mouth every 4 (four) hours as needed for cough or to loosen phlegm., Disp: 236 mL, Rfl: 0 .  memantine (NAMENDA) 5 MG tablet, Take 5 mg by mouth 2 (two) times daily., Disp: , Rfl:  .  NARCAN 4 MG/0.1ML LIQD nasal spray kit, Place 0.4 mg into the nose daily as needed (for accidental overdose.). , Disp: , Rfl:  .  ondansetron (ZOFRAN) 4 MG tablet, Take 4 mg by mouth as needed., Disp: , Rfl:  .  pantoprazole (PROTONIX) 40 MG tablet, Take 40 mg by mouth 2 (two) times daily. Take 1 tablet (40 mg) by mouth scheduled every morning, may repeat dose in evening if needed for heartburn/indigestion., Disp: , Rfl:  .  pregabalin (LYRICA) 300 MG capsule, Take 300 mg by mouth 2 (two) times daily., Disp: , Rfl:  .  silver sulfADIAZINE (SILVADENE) 1 % cream, Apply 1 application topically daily as needed (leg infection). , Disp: , Rfl:  .  tiotropium (SPIRIVA HANDIHALER) 18 MCG inhalation capsule, inhale the contents of one capsule in the handihaler once daily (Patient taking differently: Place 18 mcg into inhaler and inhale daily. inhale the contents of one capsule in the handihaler once daily), Disp: 30 capsule, Rfl: 12 .  triamcinolone (KENALOG) 0.025 % ointment, Apply 1 application topically 2 (two) times daily. (Patient taking differently: Apply 1 application topically 2 (two) times daily as needed (cracking skin). ), Disp: 15 g, Rfl: 0 .  VENTOLIN HFA 108 (90 Base) MCG/ACT inhaler, Inhale 2 puffs into the lungs every 4 (four) hours as needed for wheezing or shortness of breath. (Patient taking differently: Inhale 2 puffs into the lungs every 4 (four) hours as needed for wheezing or shortness of breath. ), Disp: 18 g, Rfl: 5 .  vitamin B-12 (CYANOCOBALAMIN)  1000 MCG tablet, Take 1 tablet (1,000 mcg total) by mouth daily., Disp: 30 tablet, Rfl: 0  Review of Systems  Constitutional: Negative for appetite change, chills, fatigue and fever.  Respiratory: Positive for cough (productive with light yellow phlegm). Negative for chest tightness and shortness of breath.   Cardiovascular: Negative for chest pain and palpitations.  Gastrointestinal: Negative for abdominal pain, nausea and vomiting.  Neurological: Negative for dizziness and weakness.    Social History   Tobacco Use  . Smoking status: Current Every Day Smoker    Packs/day: 1.00    Years: 50.00    Pack years: 50.00    Types: Cigarettes  . Smokeless tobacco: Never Used  . Tobacco comment: Previously smoked 2 ppd  Substance Use Topics  . Alcohol use: No    Alcohol/week: 0.0 standard drinks  Objective:   BP (!) 95/57 (BP Location: Left Arm, Patient Position: Sitting, Cuff Size: Normal)   Pulse 85   Temp 98.7 F (37.1 C) (Oral)   Resp 16   SpO2 98% Comment: room air    Physical Exam  General appearance: alert, well developed, well nourished, cooperative and in no distress Head: Normocephalic, without obvious abnormality, atraumatic Respiratory: diffuse, but mild expiratory wheezing with occasional rales at bases Extremities: No gross deformities Skin: Skin color, texture, turgor normal. No rashes seen  Psych: Appropriate mood and affect. Neurologic: Mental status: Alert, oriented to person, place, and time, thought content appropriate.     Assessment & Plan    1. 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 Will resume at '10mg'$  daily and follow up in a month, then slowing wean.   2. Pneumonia due to infectious organism, unspecified laterality, unspecified part of lung No records available form Dr. Humphrey Rolls who apparently started her on levofloxacin last week. She should still have 4-5 days of antibiotic left. She will  double check on supply when she gets home.   - DG Chest 2 View; Future  Flu vaccine given today.     Lelon Huh, MD  [facility]

## 2018-12-15 NOTE — Patient Instructions (Signed)
.   Please bring all of your medications to every appointment so we can make sure that our medication list is the same as yours.   . Go to the [facility] on [address] for chest Xray    

## 2018-12-16 ENCOUNTER — Telehealth: Payer: Self-pay | Admitting: Family Medicine

## 2018-12-16 ENCOUNTER — Telehealth: Payer: Self-pay

## 2018-12-16 DIAGNOSIS — J449 Chronic obstructive pulmonary disease, unspecified: Secondary | ICD-10-CM | POA: Diagnosis not present

## 2018-12-16 NOTE — Telephone Encounter (Signed)
-----   Message from Malva Limes, MD sent at 12/15/2018  7:31 PM EST ----- Chest xray looks good. Can finish levofloxacin prescribed by Dr, Welton Flakes, but no additional antibiotic needed.  Follow up regarding prednisone and polymyalgia in one month.

## 2018-12-16 NOTE — Telephone Encounter (Signed)
Patient was calling Josie back

## 2018-12-16 NOTE — Telephone Encounter (Signed)
Mr. Giasson advised (On DPR).  Apt made for 01/21/2019 at 9:40.   Thanks,   -Vernona Rieger

## 2018-12-16 NOTE — Telephone Encounter (Signed)
LMTCB

## 2018-12-17 DIAGNOSIS — I10 Essential (primary) hypertension: Secondary | ICD-10-CM | POA: Diagnosis not present

## 2018-12-17 DIAGNOSIS — D509 Iron deficiency anemia, unspecified: Secondary | ICD-10-CM | POA: Diagnosis not present

## 2018-12-17 DIAGNOSIS — M4180 Other forms of scoliosis, site unspecified: Secondary | ICD-10-CM | POA: Diagnosis not present

## 2018-12-17 DIAGNOSIS — I2510 Atherosclerotic heart disease of native coronary artery without angina pectoris: Secondary | ICD-10-CM | POA: Diagnosis not present

## 2018-12-17 DIAGNOSIS — F17210 Nicotine dependence, cigarettes, uncomplicated: Secondary | ICD-10-CM | POA: Diagnosis not present

## 2018-12-17 DIAGNOSIS — G609 Hereditary and idiopathic neuropathy, unspecified: Secondary | ICD-10-CM | POA: Diagnosis not present

## 2018-12-17 DIAGNOSIS — I739 Peripheral vascular disease, unspecified: Secondary | ICD-10-CM | POA: Diagnosis not present

## 2018-12-17 DIAGNOSIS — G8929 Other chronic pain: Secondary | ICD-10-CM | POA: Diagnosis not present

## 2018-12-17 DIAGNOSIS — J449 Chronic obstructive pulmonary disease, unspecified: Secondary | ICD-10-CM | POA: Diagnosis not present

## 2018-12-18 DIAGNOSIS — J449 Chronic obstructive pulmonary disease, unspecified: Secondary | ICD-10-CM | POA: Diagnosis not present

## 2018-12-18 DIAGNOSIS — I739 Peripheral vascular disease, unspecified: Secondary | ICD-10-CM | POA: Diagnosis not present

## 2018-12-18 DIAGNOSIS — D509 Iron deficiency anemia, unspecified: Secondary | ICD-10-CM | POA: Diagnosis not present

## 2018-12-18 DIAGNOSIS — G609 Hereditary and idiopathic neuropathy, unspecified: Secondary | ICD-10-CM | POA: Diagnosis not present

## 2018-12-18 DIAGNOSIS — G8929 Other chronic pain: Secondary | ICD-10-CM | POA: Diagnosis not present

## 2018-12-18 DIAGNOSIS — F17210 Nicotine dependence, cigarettes, uncomplicated: Secondary | ICD-10-CM | POA: Diagnosis not present

## 2018-12-18 DIAGNOSIS — I10 Essential (primary) hypertension: Secondary | ICD-10-CM | POA: Diagnosis not present

## 2018-12-18 DIAGNOSIS — I2510 Atherosclerotic heart disease of native coronary artery without angina pectoris: Secondary | ICD-10-CM | POA: Diagnosis not present

## 2018-12-18 DIAGNOSIS — M4180 Other forms of scoliosis, site unspecified: Secondary | ICD-10-CM | POA: Diagnosis not present

## 2018-12-21 ENCOUNTER — Telehealth: Payer: Self-pay | Admitting: Family Medicine

## 2018-12-21 ENCOUNTER — Encounter: Payer: Self-pay | Admitting: Emergency Medicine

## 2018-12-21 ENCOUNTER — Other Ambulatory Visit: Payer: Self-pay

## 2018-12-21 ENCOUNTER — Inpatient Hospital Stay
Admission: EM | Admit: 2018-12-21 | Discharge: 2018-12-22 | DRG: 177 | Disposition: A | Discharge status: Home Health | Payer: Medicare HMO | Attending: Specialist | Admitting: Specialist

## 2018-12-21 ENCOUNTER — Ambulatory Visit: Payer: Medicare HMO | Admitting: Family Medicine

## 2018-12-21 ENCOUNTER — Emergency Department: Payer: Medicare HMO

## 2018-12-21 DIAGNOSIS — J449 Chronic obstructive pulmonary disease, unspecified: Secondary | ICD-10-CM | POA: Diagnosis present

## 2018-12-21 DIAGNOSIS — Z882 Allergy status to sulfonamides status: Secondary | ICD-10-CM

## 2018-12-21 DIAGNOSIS — R0602 Shortness of breath: Secondary | ICD-10-CM | POA: Diagnosis not present

## 2018-12-21 DIAGNOSIS — G8929 Other chronic pain: Secondary | ICD-10-CM | POA: Diagnosis not present

## 2018-12-21 DIAGNOSIS — J189 Pneumonia, unspecified organism: Secondary | ICD-10-CM

## 2018-12-21 DIAGNOSIS — F17210 Nicotine dependence, cigarettes, uncomplicated: Secondary | ICD-10-CM | POA: Diagnosis not present

## 2018-12-21 DIAGNOSIS — G609 Hereditary and idiopathic neuropathy, unspecified: Secondary | ICD-10-CM | POA: Diagnosis not present

## 2018-12-21 DIAGNOSIS — J690 Pneumonitis due to inhalation of food and vomit: Secondary | ICD-10-CM | POA: Diagnosis not present

## 2018-12-21 DIAGNOSIS — Z885 Allergy status to narcotic agent status: Secondary | ICD-10-CM

## 2018-12-21 DIAGNOSIS — R0902 Hypoxemia: Secondary | ICD-10-CM

## 2018-12-21 DIAGNOSIS — J181 Lobar pneumonia, unspecified organism: Secondary | ICD-10-CM

## 2018-12-21 DIAGNOSIS — J168 Pneumonia due to other specified infectious organisms: Secondary | ICD-10-CM | POA: Diagnosis not present

## 2018-12-21 DIAGNOSIS — Z7902 Long term (current) use of antithrombotics/antiplatelets: Secondary | ICD-10-CM

## 2018-12-21 DIAGNOSIS — K219 Gastro-esophageal reflux disease without esophagitis: Secondary | ICD-10-CM | POA: Diagnosis not present

## 2018-12-21 DIAGNOSIS — Z8701 Personal history of pneumonia (recurrent): Secondary | ICD-10-CM

## 2018-12-21 DIAGNOSIS — Z9981 Dependence on supplemental oxygen: Secondary | ICD-10-CM

## 2018-12-21 DIAGNOSIS — A419 Sepsis, unspecified organism: Secondary | ICD-10-CM

## 2018-12-21 DIAGNOSIS — Z79899 Other long term (current) drug therapy: Secondary | ICD-10-CM

## 2018-12-21 DIAGNOSIS — Z888 Allergy status to other drugs, medicaments and biological substances status: Secondary | ICD-10-CM

## 2018-12-21 DIAGNOSIS — M810 Age-related osteoporosis without current pathological fracture: Secondary | ICD-10-CM | POA: Diagnosis not present

## 2018-12-21 DIAGNOSIS — Z886 Allergy status to analgesic agent status: Secondary | ICD-10-CM

## 2018-12-21 DIAGNOSIS — Z981 Arthrodesis status: Secondary | ICD-10-CM

## 2018-12-21 DIAGNOSIS — J9621 Acute and chronic respiratory failure with hypoxia: Secondary | ICD-10-CM | POA: Diagnosis not present

## 2018-12-21 DIAGNOSIS — E785 Hyperlipidemia, unspecified: Secondary | ICD-10-CM | POA: Diagnosis not present

## 2018-12-21 DIAGNOSIS — G629 Polyneuropathy, unspecified: Secondary | ICD-10-CM | POA: Diagnosis present

## 2018-12-21 DIAGNOSIS — Z86718 Personal history of other venous thrombosis and embolism: Secondary | ICD-10-CM

## 2018-12-21 DIAGNOSIS — I2510 Atherosclerotic heart disease of native coronary artery without angina pectoris: Secondary | ICD-10-CM | POA: Diagnosis not present

## 2018-12-21 DIAGNOSIS — Z79818 Long term (current) use of other agents affecting estrogen receptors and estrogen levels: Secondary | ICD-10-CM

## 2018-12-21 DIAGNOSIS — D509 Iron deficiency anemia, unspecified: Secondary | ICD-10-CM | POA: Diagnosis not present

## 2018-12-21 DIAGNOSIS — R509 Fever, unspecified: Secondary | ICD-10-CM | POA: Diagnosis not present

## 2018-12-21 DIAGNOSIS — J9610 Chronic respiratory failure, unspecified whether with hypoxia or hypercapnia: Secondary | ICD-10-CM | POA: Diagnosis not present

## 2018-12-21 DIAGNOSIS — I252 Old myocardial infarction: Secondary | ICD-10-CM

## 2018-12-21 DIAGNOSIS — I739 Peripheral vascular disease, unspecified: Secondary | ICD-10-CM | POA: Diagnosis present

## 2018-12-21 DIAGNOSIS — Z7951 Long term (current) use of inhaled steroids: Secondary | ICD-10-CM

## 2018-12-21 DIAGNOSIS — F419 Anxiety disorder, unspecified: Secondary | ICD-10-CM | POA: Diagnosis present

## 2018-12-21 DIAGNOSIS — M4180 Other forms of scoliosis, site unspecified: Secondary | ICD-10-CM | POA: Diagnosis not present

## 2018-12-21 DIAGNOSIS — I10 Essential (primary) hypertension: Secondary | ICD-10-CM | POA: Diagnosis not present

## 2018-12-21 LAB — CBC
HCT: 33.8 % — ABNORMAL LOW (ref 36.0–46.0)
Hemoglobin: 10.7 g/dL — ABNORMAL LOW (ref 12.0–15.0)
MCH: 30.8 pg (ref 26.0–34.0)
MCHC: 31.7 g/dL (ref 30.0–36.0)
MCV: 97.4 fL (ref 80.0–100.0)
PLATELETS: 186 10*3/uL (ref 150–400)
RBC: 3.47 MIL/uL — ABNORMAL LOW (ref 3.87–5.11)
RDW: 15.8 % — ABNORMAL HIGH (ref 11.5–15.5)
WBC: 12.1 10*3/uL — ABNORMAL HIGH (ref 4.0–10.5)
nRBC: 0 % (ref 0.0–0.2)

## 2018-12-21 LAB — URINALYSIS, ROUTINE W REFLEX MICROSCOPIC
Bilirubin Urine: NEGATIVE
Glucose, UA: NEGATIVE mg/dL
Hgb urine dipstick: NEGATIVE
KETONES UR: NEGATIVE mg/dL
Leukocytes, UA: NEGATIVE
Nitrite: NEGATIVE
PH: 5 (ref 5.0–8.0)
Protein, ur: NEGATIVE mg/dL
Specific Gravity, Urine: 1.015 (ref 1.005–1.030)

## 2018-12-21 LAB — BASIC METABOLIC PANEL
Anion gap: 5 (ref 5–15)
BUN: 16 mg/dL (ref 8–23)
CO2: 28 mmol/L (ref 22–32)
Calcium: 8.6 mg/dL — ABNORMAL LOW (ref 8.9–10.3)
Chloride: 104 mmol/L (ref 98–111)
Creatinine, Ser: 0.94 mg/dL (ref 0.44–1.00)
GFR calc non Af Amer: >60 mL/min (ref >60)
Glucose, Bld: 92 mg/dL (ref 70–99)
Potassium: 4.7 mmol/L (ref 3.5–5.1)
Sodium: 137 mmol/L (ref 135–145)

## 2018-12-21 LAB — INFLUENZA PANEL BY PCR (TYPE A & B)
Influenza A By PCR: NEGATIVE
Influenza B By PCR: NEGATIVE

## 2018-12-21 LAB — BRAIN NATRIURETIC PEPTIDE: B Natriuretic Peptide: 39 pg/mL (ref 0.0–100.0)

## 2018-12-21 LAB — BLOOD GAS, VENOUS
Acid-Base Excess: 5.2 mmol/L — ABNORMAL HIGH (ref 0.0–2.0)
Bicarbonate: 32.9 mmol/L — ABNORMAL HIGH (ref 20.0–28.0)
O2 SAT: 68.2 %
Patient temperature: 37
pCO2, Ven: 61 mmHg — ABNORMAL HIGH (ref 44.0–60.0)
pH, Ven: 7.34 (ref 7.250–7.430)
pO2, Ven: 38 mmHg (ref 32.0–45.0)

## 2018-12-21 LAB — LACTIC ACID, PLASMA: Lactic Acid, Venous: 0.9 mmol/L (ref 0.5–1.9)

## 2018-12-21 LAB — TROPONIN I

## 2018-12-21 MED ORDER — FERROUS SULFATE 325 (65 FE) MG PO TABS
325.0000 mg | ORAL_TABLET | Freq: Two times a day (BID) | ORAL | Status: DC
  Administered 2018-12-22: 325 mg via ORAL
  Filled 2018-12-21: qty 1

## 2018-12-21 MED ORDER — AMLODIPINE BESYLATE 5 MG PO TABS
5.0000 mg | ORAL_TABLET | Freq: Every day | ORAL | Status: DC
  Administered 2018-12-22: 5 mg via ORAL
  Filled 2018-12-21: qty 1

## 2018-12-21 MED ORDER — SODIUM CHLORIDE 0.9 % IV BOLUS
750.0000 mL | Freq: Once | INTRAVENOUS | Status: AC
  Administered 2018-12-21: 750 mL via INTRAVENOUS

## 2018-12-21 MED ORDER — ORAL CARE MOUTH RINSE: 15.0000 mL | Freq: Two times a day (BID) | OROMUCOSAL | Status: DC

## 2018-12-21 MED ORDER — ATORVASTATIN CALCIUM 20 MG PO TABS: 40.0000 mg | ORAL_TABLET | Freq: Every day | ORAL | Status: DC

## 2018-12-21 MED ORDER — BACLOFEN 10 MG PO TABS
10.0000 mg | ORAL_TABLET | Freq: Three times a day (TID) | ORAL | Status: DC | PRN
  Filled 2018-12-21 (×2): qty 1

## 2018-12-21 MED ORDER — PREGABALIN 75 MG PO CAPS
300.0000 mg | ORAL_CAPSULE | Freq: Two times a day (BID) | ORAL | Status: DC
  Administered 2018-12-22: 300 mg via ORAL
  Filled 2018-12-21 (×2): qty 4

## 2018-12-21 MED ORDER — FUROSEMIDE 20 MG PO TABS
20.0000 mg | ORAL_TABLET | ORAL | Status: DC
  Filled 2018-12-21: qty 1

## 2018-12-21 MED ORDER — BENZONATATE 100 MG PO CAPS: 100.0000 mg | ORAL_CAPSULE | Freq: Three times a day (TID) | ORAL | Status: DC | PRN

## 2018-12-21 MED ORDER — FAMOTIDINE 20 MG PO TABS
20.0000 mg | ORAL_TABLET | Freq: Every day | ORAL | Status: DC
  Administered 2018-12-22: 20 mg via ORAL
  Filled 2018-12-21: qty 1

## 2018-12-21 MED ORDER — SODIUM CHLORIDE 0.9 % IV BOLUS
1000.0000 mL | Freq: Once | INTRAVENOUS | Status: AC
  Administered 2018-12-21: 1000 mL via INTRAVENOUS

## 2018-12-21 MED ORDER — ENOXAPARIN SODIUM 40 MG/0.4ML ~~LOC~~ SOLN
40.0000 mg | SUBCUTANEOUS | Status: DC
  Filled 2018-12-21 (×2): qty 0.4

## 2018-12-21 MED ORDER — VANCOMYCIN HCL IN DEXTROSE 1-5 GM/200ML-% IV SOLN
1000.0000 mg | Freq: Once | INTRAVENOUS | Status: AC
  Administered 2018-12-21: 1000 mg via INTRAVENOUS
  Filled 2018-12-21: qty 200

## 2018-12-21 MED ORDER — ACETAMINOPHEN 500 MG PO TABS
1000.0000 mg | ORAL_TABLET | Freq: Once | ORAL | Status: AC
  Administered 2018-12-21: 1000 mg via ORAL
  Filled 2018-12-21: qty 2

## 2018-12-21 MED ORDER — MEMANTINE HCL 5 MG PO TABS
5.0000 mg | ORAL_TABLET | Freq: Two times a day (BID) | ORAL | Status: DC
  Administered 2018-12-22: 5 mg via ORAL
  Filled 2018-12-21 (×2): qty 1

## 2018-12-21 MED ORDER — SODIUM CHLORIDE 0.9 % IV BOLUS
1000.0000 mL | Freq: Once | INTRAVENOUS | Status: DC
  Administered 2018-12-21: 1000 mL via INTRAVENOUS

## 2018-12-21 MED ORDER — VITAMIN B-12 1000 MCG PO TABS
1000.0000 ug | ORAL_TABLET | Freq: Every day | ORAL | Status: DC
  Administered 2018-12-22: 1000 ug via ORAL
  Filled 2018-12-21: qty 1

## 2018-12-21 MED ORDER — MOMETASONE FURO-FORMOTEROL FUM 200-5 MCG/ACT IN AERO
2.0000 | INHALATION_SPRAY | Freq: Two times a day (BID) | RESPIRATORY_TRACT | Status: DC
  Administered 2018-12-22: 2 via RESPIRATORY_TRACT
  Filled 2018-12-21: qty 8.8

## 2018-12-21 MED ORDER — FENTANYL 50 MCG/HR TD PT72
1.0000 | MEDICATED_PATCH | TRANSDERMAL | Status: DC
  Administered 2018-12-21: 1 via TRANSDERMAL
  Filled 2018-12-21 (×2): qty 1

## 2018-12-21 MED ORDER — VANCOMYCIN HCL 10 G IV SOLR
1250.0000 mg | Freq: Once | INTRAVENOUS | Status: DC
  Filled 2018-12-21: qty 1250

## 2018-12-21 MED ORDER — CLOPIDOGREL BISULFATE 75 MG PO TABS
75.0000 mg | ORAL_TABLET | Freq: Every day | ORAL | Status: DC
  Administered 2018-12-22: 75 mg via ORAL
  Filled 2018-12-21: qty 1

## 2018-12-21 MED ORDER — PIPERACILLIN-TAZOBACTAM 3.375 G IVPB 30 MIN
3.3750 g | Freq: Once | INTRAVENOUS | Status: AC
  Administered 2018-12-21: 3.375 g via INTRAVENOUS
  Filled 2018-12-21: qty 50

## 2018-12-21 MED ORDER — METHYLPREDNISOLONE SODIUM SUCC 125 MG IJ SOLR
125.0000 mg | Freq: Once | INTRAMUSCULAR | Status: AC
  Administered 2018-12-21: 125 mg via INTRAVENOUS
  Filled 2018-12-21: qty 2

## 2018-12-21 MED ORDER — ALPRAZOLAM 0.5 MG PO TABS: 0.2500 mg | ORAL_TABLET | Freq: Every evening | ORAL | Status: DC | PRN

## 2018-12-21 MED ORDER — ESTRADIOL 1 MG PO TABS
0.5000 mg | ORAL_TABLET | ORAL | Status: DC
  Administered 2018-12-22: 0.5 mg via ORAL
  Filled 2018-12-21: qty 0.5

## 2018-12-21 MED ORDER — SODIUM CHLORIDE 0.9% FLUSH: 3.0000 mL | Freq: Once | INTRAVENOUS | Status: DC

## 2018-12-21 MED ORDER — PANTOPRAZOLE SODIUM 40 MG PO TBEC
40.0000 mg | DELAYED_RELEASE_TABLET | Freq: Two times a day (BID) | ORAL | Status: DC
  Administered 2018-12-22: 40 mg via ORAL
  Filled 2018-12-21 (×2): qty 1

## 2018-12-21 MED ORDER — SODIUM CHLORIDE 0.9 % IV SOLN
3.0000 g | Freq: Three times a day (TID) | INTRAVENOUS | Status: DC
  Administered 2018-12-22: 3 g via INTRAVENOUS
  Filled 2018-12-21 (×4): qty 3

## 2018-12-21 MED ORDER — ONDANSETRON HCL 4 MG PO TABS: 4.0000 mg | ORAL_TABLET | Freq: Three times a day (TID) | ORAL | Status: DC | PRN

## 2018-12-21 MED ORDER — ALBUTEROL SULFATE (2.5 MG/3ML) 0.083% IN NEBU: 3.0000 mL | INHALATION_SOLUTION | RESPIRATORY_TRACT | Status: DC | PRN

## 2018-12-21 MED ORDER — TIOTROPIUM BROMIDE MONOHYDRATE 18 MCG IN CAPS
18.0000 ug | ORAL_CAPSULE | Freq: Every day | RESPIRATORY_TRACT | Status: DC
  Administered 2018-12-21 – 2018-12-22 (×2): 18 ug via RESPIRATORY_TRACT
  Filled 2018-12-21: qty 5

## 2018-12-21 NOTE — ED Triage Notes (Signed)
First nurse Note:  Arrives for ED evaluation of fever, tachycardia, HTN.  Recently treated for pneumonia.  AAOx3.  Skin warm and dry. NAD

## 2018-12-21 NOTE — ED Triage Notes (Signed)
Treated for lung infectio by dr Margrett Rudshaddat khan.  Last dose of abt today.  Continues to cough and have fever.

## 2018-12-21 NOTE — ED Provider Notes (Signed)
[facility] Emergency Department Provider Note  ____________________________________________  Time seen: Approximately 5:22 PM  I have reviewed the triage vital signs and the nursing notes.   HISTORY  Chief Complaint Cough and Fever    HPI Cheryl [name] is a 68 y.o. female with a history of COPD and ongoing tobacco abuse, wears 3 L nasal cannula as needed at baseline, CAD and CHF, recurrent aspiration pneumonia, presenting for hypoxia, shortness of breath, fever and hypotension.  The patient was at home receiving physical therapy when her O2 sats were found to be 82% on room air.  The patient was discharged 12/15 after a 5-day hospitalization for sepsis with pneumonia, as well as an STEMI.  Since then, she states she has had exertional shortness of breath, cough and cold symptoms.  She has been on outpatient antibiotics but does not know what the name of them are.  At this time, the patient states that she is feeling better and is hungry  Past Medical History:  Diagnosis Date  . Acute postoperative pain 04/14/2017  . Allergy   . Anemia   . Anxiety   . Arthritis    fingers  . Aspiration pneumonitis (HCC) 11/24/2015  . Asthma   . CHF (congestive heart failure) (HCC)   . Chronic pain   . COPD (chronic obstructive pulmonary disease) (HCC)   . Coronary artery disease   . DVT (deep venous thrombosis) (HCC)   . Dyspnea   . Dysrhythmia   . Encephalopathy    MULTIPLE TIMES  . GERD (gastroesophageal reflux disease)   . Headache   . Hyperlipidemia   . Hypertension   . Kyphoscoliosis deformity of spine   . Migraines   . Neuropathy 2010  . Osteoporosis   . Oxygen deficiency   . Peripheral vascular disease (HCC)   . Pneumonia   . Pneumonia 11/19/2015  . Pneumonia 10/2016  . Pneumonia    aspiration 2018- 4 times in last year  . Rib fracture 09/19/2017   Displace right 9th rib fracture.   . Vitamin D deficiency   . Wears dentures    full upper and  lower    Patient Active Problem List   Diagnosis Date Noted  . Sepsis (HCC) 11/03/2018  . Fever   . Respiratory distress   . NSTEMI (non-ST elevated myocardial infarction) (HCC)   . Chronic lower extremity pain (Primary Area of Pain) (Bilateral) (L>R) 09/03/2018  . Chronic hip pain (Fourth Area of Pain) (Bilateral) (L>R) 09/03/2018  . Osteoarthritis of hip (Bilateral) 09/03/2018  . Chronic sacroiliac joint pain [facility] Area of Pain) (Bilateral) (L>R) 09/03/2018  . Sacroiliac joint dysfunction (Bilateral) 09/03/2018  . Spondylosis without myelopathy or radiculopathy, lumbosacral region 09/03/2018  . Other specified dorsopathies, sacral and sacrococcygeal region 09/03/2018  . Disorder of skeletal system 09/02/2018  . Problems influencing health status 09/02/2018  . DDD (degenerative disc disease), lumbar 09/02/2018  . Abnormal CT scan, lumbar spine (08/04/2017) 09/02/2018  . Levoscoliosis (Severe) 09/02/2018  . 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 Lumbar Grade 1 Anterolisthesis of L3 over L4 and L4 over L5 09/02/2018  . Lumbar foraminal stenosis (Bilateral: L3-4, L4-5) (Severe Right L4-5) 09/02/2018  . Lumbar facet arthropathy (Bilateral) 09/02/2018  . Lumbar facet osteoarthritis (Bilateral) 09/02/2018  . Lumbar spondylosis 09/02/2018  . Cervical spondylosis 09/02/2018  . Thoracic spondylosis 09/02/2018  . DDD (degenerative disc disease), thoracic 09/02/2018  . DDD (degenerative disc disease), cervical 09/02/2018  .  Pharmacologic therapy 09/01/2018  . Spondylosis without myelopathy or radiculopathy, lumbar region 09/01/2018  . Anorexia 07/07/2018  . 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 Goals of care, counseling/discussion   . Palliative care by specialist   . Acute encephalopathy 06/18/2018  . Iron deficiency anemia 06/09/2018  . Gastritis without bleeding   . Hypoxia 05/14/2018  . Generalized weakness 02/08/2018  .  Marijuana use 06/05/2017  . Pneumonia 04/29/2017  . Metabolic encephalopathy 12/23/2016  . Hypokalemia 12/23/2016  . Protein-calorie malnutrition, severe 11/06/2016  . Nausea with vomiting   . Lumbar facet syndrome (Bilateral) (L>R) 03/20/2016  . Opiate use (160 MME/Day) 02/28/2016  . Anemia 02/28/2016  . Osteoarthrosis 12/18/2015  . Long term current use of anticoagulant therapy (Plavix) 12/18/2015  . Long term current use of opiate analgesic 12/04/2015  . Fibromyalgia 12/04/2015  . Dysphagia 11/24/2015  . Acute diastolic CHF (congestive heart failure) (HCC) 11/24/2015  . Leukocytosis 11/24/2015  . Abnormal mammogram of right breast 10/11/2015  . Dilated intrahepatic bile duct 10/11/2015  . Chronic lumbar radicular pain (Bilateral) (L>R) (L5) 09/04/2015  . Chronic low back pain (Secondary Area of Pain) (Bilateral) (L>R) 09/04/2015  . Encounter for therapeutic drug level monitoring 09/04/2015  . Uncomplicated opioid dependence (HCC) 09/04/2015  . Chronic pain syndrome 09/04/2015  . Platelet inhibition due to Plavix 09/04/2015  . COPD with acute exacerbation (HCC) 07/31/2015  . Dementia (HCC) 07/31/2015  . Airway hyperreactivity 07/03/2015  . Chronic thoracic back pain 07/03/2015  . Excessive falling 07/03/2015  . Alteration in bowel elimination: incontinence 07/03/2015  . Insomnia 07/03/2015  . Decreased testosterone level 07/03/2015  . Leg weakness 07/03/2015  . Menopausal symptom 07/03/2015  . Migraine 07/03/2015  . Neuropathy (HCC) 07/03/2015  . Fecal occult blood test positive 07/03/2015  . OP (osteoporosis) 07/03/2015  . Panic disorder 07/03/2015  . Shortness of breath 07/03/2015  . Compulsive tobacco user syndrome 07/03/2015  . Urinary incontinence 07/03/2015  . Weight loss 07/03/2015  . Essential hypertension 06/06/2015  . GERD (gastroesophageal reflux disease) 06/06/2015  . Hyperlipemia 06/06/2015  . Peripheral nerve disease 04/13/2014  . Anxiety 02/10/2014  .  Coronary artery disease 02/10/2014  . Peripheral vascular disease (HCC) 02/10/2014  . Vitamin D deficiency 10/16/2009    Past Surgical History:  Procedure Laterality Date  . ABDOMINAL HYSTERECTOMY  1975   Bilaterl Oophorectomy; Dur to IUD infection  . abdomnal aortic stent  05/30/2008   Dr. Nanetta BattyJonathan Berry  . APPENDECTOMY    . APPENDECTOMY    . BACK SURGERY    . cardiac catherization  10/31/2009  . CARDIAC CATHETERIZATION    . CATARACT EXTRACTION W/PHACO Left 08/20/2018   Procedure: CATARACT EXTRACTION PHACO AND INTRAOCULAR LENS PLACEMENT (IOC);  Surgeon: Nevada CraneKing, Bradley Mark, MD;  Location: [facility] ORS;  Service: Ophthalmology;  Laterality: Left;  US 00:45.1 AP% 12.3 CDE 5.54 FLUID PACK LOT # [id number]  . CATARACT EXTRACTION W/PHACO Right 09/23/2018   Procedure: CATARACT EXTRACTION PHACO AND INTRAOCULAR LENS PLACEMENT (IOC);  Surgeon: Nevada CraneKing, Bradley Mark, MD;  Location: [facility] ORS;  Service: Ophthalmology;  Laterality: Right;  US 00:45.0 CDE 5.19 Fluid Pack lot # [id number]  . CERVICAL FUSION  C5 - 6/C6-7  . CHOLECYSTECTOMY  1972  . COLONOSCOPY WITH PROPOFOL N/A 07/27/2015   Procedure: COLONOSCOPY WITH PROPOFOL;  Surgeon: Wallace CullensPaul Y Oh, MD;  [facility];  Service: Gastroenterology;  Laterality: N/A;  . ESOPHAGOGASTRODUODENOSCOPY (EGD) WITH PROPOFOL N/A 07/27/2015   Procedure: ESOPHAGOGASTRODUODENOSCOPY (EGD) WITH PROPOFOL;  Surgeon: Wallace CullensPaul Y Oh,  MD;  Location: [facility] ENDOSCOPY;  Service: Gastroenterology;  Laterality: N/A;  . ESOPHAGOGASTRODUODENOSCOPY (EGD) WITH PROPOFOL N/A 05/19/2018   Procedure: ESOPHAGOGASTRODUODENOSCOPY (EGD) WITH PROPOFOL;  Surgeon: Midge Minium, MD;  Location: [facility] ENDOSCOPY;  Service: Endoscopy;  Laterality: N/A;  . EYE SURGERY    . FOOT SURGERY Bilateral    5-6 years per patient  . SPINE SURGERY    . TONSILLECTOMY    . VASCULAR SURGERY     LEG STENTS    Current Outpatient Rx  . Order #: [id number] Class: No Print  . Order #: [id number] Class: Normal  . Order #:  [id number] Class: Normal  . Order #: [id number] Class: Normal  . Order #: [id number] Class: Normal  . Order #: [id number] Class: Print  . Order #: [id number] Class: Normal  . Order #: [id number] Class: Normal  . Order #: [id number] Class: Normal  . Order #: [id number] Class: Historical Med  . Order #: [id number] Class: Historical Med  . Order #: [id number] Class: Normal  . Order #: [id number] Class: Normal  . Order #: [id number] Class: Normal  . Order #: [id number] Class: Normal  . Order #: [id number] Class: Normal  . Order #: [id number] Class: Print  . Order #: [id number] Class: Print  . Order #: [id number] Class: Historical Med  . Order #: [id number] Class: Historical Med  . Order #: [id number] Class: Historical Med  . Order #: [id number] Class: Historical Med  . Order #: [id number] Class: Normal  . Order #: [id number] Class: Historical Med  . Order #: [id number] Class: Historical Med  . Order #: [id number] Class: Normal  . Order #: [id number] Class: Normal  . Order #: [id number] Class: Normal  . Order #: [id number] Class: Normal    Allergies Percocet [oxycodone-acetaminophen]; Aspirin; Codeine; Propoxyphene; and Sulfa antibiotics  Family History  Problem Relation Age of Onset  . Cancer Mother   . Arthritis Mother   . Heart disease Mother   . Diabetes Mother        mellitus, type 2  . Heart disease Father   . Diabetes Sister   . Cancer Brother   . Cancer Brother        lung  . Diabetes Brother     Social History Social History   Tobacco Use  . Smoking status: Current Every Day Smoker    Packs/day: 0.50    Years: 50.00    Pack years: 25.00    Types: Cigarettes  . Smokeless tobacco: Never Used  . Tobacco comment: Previously smoked 2 ppd  Substance Use Topics  . Alcohol use: No    Alcohol/week: 0.0 standard drinks  . Drug use: No    Review of Systems Constitutional: Positive fever.  Positive general malaise. Eyes: No visual changes.  No eye discharge. ENT: No sore throat. No congestion or  rhinorrhea. Cardiovascular: Denies chest pain. Denies palpitations. Respiratory: Positive shortness of breath.  Positive cough.  Positive increased oxygen requirement. Gastrointestinal: No abdominal pain.  No nausea, no vomiting.  No diarrhea.  No constipation. Genitourinary: Negative for dysuria. Musculoskeletal: Negative for back pain.  No lower extremity edema. Skin: Negative for rash. Neurological: Negative for headaches. No focal numbness, tingling or weakness.     ____________________________________________   PHYSICAL EXAM:  VITAL SIGNS: ED Triage Vitals  Enc Vitals Group     BP 12/21/18 1455 98/72     Pulse Rate 12/21/18 1455 95     Resp 12/21/18 1455 18     Temp 12/21/18 1455 (!) 100.9 F (38.3 C)     Temp Source 12/21/18 1455 Oral     SpO2 12/21/18 1455 92 %     Weight  12/21/18 1456 110 lb 0.2 oz (49.9 kg)     Height 12/21/18 1456 4\' 10"  (1.473 m)     Head Circumference --      Peak Flow --      Pain Score 12/21/18 1455 4     Pain Loc --      Pain Edu? --      Excl. in GC? --     Constitutional: Alert and oriented. Answers questions appropriately.  Chronically ill-appearing.  Mentating normally. Eyes: Conjunctivae are normal.  EOMI. No scleral icterus. Head: Atraumatic. Nose: No congestion/rhinnorhea. Mouth/Throat: Mucous membranes are moist.  Neck: No stridor.  Supple.  Mild JVD.  No meningismus. Cardiovascular: Normal rate, regular rhythm. No murmurs, rubs or gallops.  Respiratory: The patient has mild tachypnea with accessory muscle use and retractions.  She has a prolonged expiratory phase.  She has rales in the right and decreased breath sounds in the left with poor air exchange.  On my exam, the patient's oxygen saturation is mid 90s on 3 L nasal cannula. Gastrointestinal: Soft, nontender and nondistended.  No guarding or rebound.  No peritoneal signs. Musculoskeletal: No LE edema. No ttp in the calves or palpable cords.  Negative Homan's  sign. Neurologic:  A&Ox3.  Speech is clear.  Face and smile are symmetric.  EOMI.  Moves all extremities well. Skin:  Skin is warm, dry and intact. No rash noted. Psychiatric: Mood and affect are normal. Speech and behavior are normal.  Normal judgement.  ____________________________________________   LABS (all labs ordered are listed, but only abnormal results are displayed)  Labs Reviewed  BASIC METABOLIC PANEL - Abnormal; Notable for the following components:      Result Value   Calcium 8.6 (*)    All other components within normal limits  CBC - Abnormal; Notable for the following components:   WBC 12.1 (*)    RBC 3.47 (*)    Hemoglobin 10.7 (*)    HCT 33.8 (*)    RDW 15.8 (*)    All other components within normal limits  CULTURE, BLOOD (ROUTINE X 2)  CULTURE, BLOOD (ROUTINE X 2)  LACTIC ACID, PLASMA  URINALYSIS, ROUTINE W REFLEX MICROSCOPIC  BLOOD GAS, VENOUS  INFLUENZA PANEL BY PCR (TYPE A & B)  BRAIN NATRIURETIC PEPTIDE  TROPONIN I   ____________________________________________  EKG  ED ECG REPORT I, Anne-Caroline Sharma Covert, the attending physician, personally viewed and interpreted this ECG.   Date: 12/21/2018  EKG Time: 1503  Rate: 97  Rhythm: normal sinus rhythm  Axis: normal  Intervals:none  ST&T Change: No STEMI  ____________________________________________  RADIOLOGY  Dg Chest 2 View  Result Date: 12/21/2018 CLINICAL DATA:  68 year old female with a history of fever EXAM: CHEST - 2 VIEW COMPARISON:  12/15/2018 FINDINGS: Cardiomediastinal silhouette unchanged. Double density over the lower mediastinum is unchanged. No pneumothorax. No pleural effusion. Opacity overlying the right heart border with a correlate on the lateral view. Degenerative changes of the spine.  No displaced fracture. IMPRESSION: Evidence of right-sided lobar pneumonia. Followup PA and lateral chest X-ray is recommended in 3-4 weeks following trial of antibiotic therapy to ensure  resolution. Hiatal hernia Electronically Signed   By: Gilmer Mor D.O.   On: 12/21/2018 15:45    ____________________________________________   PROCEDURES  Procedure(s) performed: None  Procedures  Critical Care performed: Yes, see critical care note(s) ____________________________________________   INITIAL IMPRESSION / ASSESSMENT AND PLAN / ED COURSE  Pertinent labs & imaging results that were available during my  care of the patient were reviewed by me and considered in my medical decision making (see chart for details).  68 y.o. female with COPD and ongoing tobacco abuse presenting for ongoing cough, fever, hypotension, and hypoxia.  Overall, the patient's clinical history and presentation are most consistent with pneumonia.  On 3 L nasal cannula, her O2 sats are in the mid 90s, and she is mentating normally although I do suspect she likely has some hypercarbia giving her underlying lung disease.  We will get a VBG for further evaluation.  The patient will be treated empirically with antibiotics for hospital-acquired pneumonia, given his her recent hospitalization 12/19.  PE is considered, but this is less likely.  An acute cardiac cause is also likely, especially given the patient's recent and STEMI.  She has not been having any chest pain, and her EKG does not show ischemic changes.  We are waiting the results of her troponin and BNP at this time.  The patient will be admitted to the hospitalist for ongoing evaluation and treatment.  ----------------------------------------- 5:29 PM on 12/21/2018 -----------------------------------------  The patient's chest x-ray does show a right-sided lobar pneumonia and she does have an elevated white blood cell count compared to her prior on 12/01/2018.  Again, her clinical picture is most consistent with an infectious process and she is under treatment for this.  I have also given her Solu-Medrol given her underlying COPD.  CRITICAL  CARE Performed by: Rockne MenghiniAnne-Caroline Carolynn Tuley   Total critical care time: 35 minutes  Critical care time was exclusive of separately billable procedures and treating other patients.  Critical care was necessary to treat or prevent imminent or life-threatening deterioration.  Critical care was time spent personally by me on the following activities: development of treatment plan with patient and/or surrogate as well as nursing, discussions with consultants, evaluation of patient's response to treatment, examination of patient, obtaining history from patient or surrogate, ordering and performing treatments and interventions, ordering and review of laboratory studies, ordering and review of radiographic studies, pulse oximetry and re-evaluation of patient's condition.   ____________________________________________  FINAL CLINICAL IMPRESSION(S) / ED DIAGNOSES  Final diagnoses:  Sepsis, due to unspecified organism, unspecified whether acute organ dysfunction present (HCC)  Pneumonia of right lower lobe due to infectious organism [facility](HCC)  Hypoxia         NEW MEDICATIONS STARTED DURING THIS VISIT:  New Prescriptions   No medications on file      Rockne MenghiniNorman, Anne-Caroline, MD 12/21/18 1730

## 2018-12-21 NOTE — Telephone Encounter (Signed)
Cheryl [name], PT w/ [facility]   Call husband [name] [phone]   Report as of today: Temp - 100.6 BP - 160/90 Resting heart rate -  120 beats per min Spitting up orangish phlegm Finish antibiotics yesterday  Should husband take her to doctor or er?  Please advise.  Thanks, [employer]

## 2018-12-21 NOTE — Telephone Encounter (Signed)
Homero Fellers advise.  He is going to take her to the ER.  Thanks,   -Vernona Rieger

## 2018-12-21 NOTE — Progress Notes (Signed)
CODE SEPSIS - PHARMACY COMMUNICATION  **Broad Spectrum Antibiotics should be administered within 1 hour of Sepsis diagnosis**  Time Code Sepsis Called/Page Received: 1705  Antibiotics Ordered: Zosyn/Vancomycin  Time of 1st antibiotic administration: 1745  Additional action taken by pharmacy: Called and spoke with Elease Hashimoto ("getting them on the pole now")  If necessary, Name of Provider/Nurse Contacted: Lodema Hong ,PharmD Clinical Pharmacist  12/21/2018  5:04 PM

## 2018-12-21 NOTE — Telephone Encounter (Signed)
She needs to go to ER. She is very prone to getting pneumonia and needing IV antibiotic.

## 2018-12-21 NOTE — H&P (Signed)
[facility] [place] NAME: Simonne Boulos    MR#:  [id number]  DATE OF BIRTH:  01-01-1951  DATE OF ADMISSION:  12/21/2018  PRIMARY CARE PHYSICIAN: Birdie Sons, MD   REQUESTING/REFERRING PHYSICIAN: Dr Mariea Clonts  CHIEF COMPLAINT:  increasing shortness of breath cough with productive phlegm  HISTORY OF PRESENT ILLNESS:  Shanita Kanan  is a 68 y.o. female with a known history of betrayal failure with COPD and ongoing tobacco abuse uses 3 L nasal cannula oxygen recently treated for aspiration pneumonia in December 2019 comes to the emergency room after she started having cough with productive phlegm and shortness of breath with some hypoxia at home.  Pt was found to have right lower lobe pneumonia. She has been advised to use thickened liquid how were she is in denial that she does not have aspiration and is been noncompliant with thickened liquid  Patient is being admitted for further evaluation of management  PAST MEDICAL HISTORY:   Past Medical History:  Diagnosis Date  . Acute postoperative pain 04/14/2017  . Allergy   . Anemia   . Anxiety   . Arthritis    fingers  . Aspiration pneumonitis ([place]) 11/24/2015  . Asthma   . CHF (congestive heart failure) ([place])   . Chronic pain   . COPD (chronic obstructive pulmonary disease) ([place])   . Coronary artery disease   . DVT (deep venous thrombosis) ([place])   . Dyspnea   . Dysrhythmia   . Encephalopathy    MULTIPLE TIMES  . GERD (gastroesophageal reflux disease)   . Headache   . Hyperlipidemia   . Hypertension   . Kyphoscoliosis deformity of spine   . Migraines   . Neuropathy 2010  . Osteoporosis   . Oxygen deficiency   . Peripheral vascular disease ([place])   . Pneumonia   . Pneumonia 11/19/2015  . Pneumonia 10/2016  . Pneumonia    aspiration 2018- 4 times in last year  . Rib fracture 09/19/2017   Displace right 9th rib fracture.   . Vitamin D deficiency   . Wears dentures     full upper and lower    PAST SURGICAL HISTOIRY:   Past Surgical History:  Procedure Laterality Date  . ABDOMINAL HYSTERECTOMY  1975   Bilaterl Oophorectomy; [place] to IUD infection  . abdomnal aortic stent  05/30/2008   Dr. Quay Burow  . APPENDECTOMY    . APPENDECTOMY    . BACK SURGERY    . cardiac catherization  10/31/2009  . CARDIAC CATHETERIZATION    . CATARACT EXTRACTION W/PHACO Left 08/20/2018   Procedure: CATARACT EXTRACTION PHACO AND INTRAOCULAR LENS PLACEMENT (IOC);  Surgeon: Eulogio Bear, MD;  Location: [facility] ORS;  Service: Ophthalmology;  Laterality: Left;  [place] 00:45.1 AP% 12.3 CDE 5.54 FLUID PACK LOT # [id number]  . CATARACT EXTRACTION W/PHACO Right 09/23/2018   Procedure: CATARACT EXTRACTION PHACO AND INTRAOCULAR LENS PLACEMENT ([place]);  Surgeon: Eulogio Bear, MD;  Location: [facility] ORS;  Service: Ophthalmology;  Laterality: Right;  [place] 00:45.0 CDE 5.19 Fluid Pack lot # [id number]  . CERVICAL FUSION  C5 - 6/C6-7  . CHOLECYSTECTOMY  1972  . COLONOSCOPY WITH PROPOFOL N/A 07/27/2015   Procedure: COLONOSCOPY WITH PROPOFOL;  Surgeon: Hulen Luster, MD;  Location: [facility] ENDOSCOPY;  Service: Gastroenterology;  Laterality: N/A;  . ESOPHAGOGASTRODUODENOSCOPY (EGD) WITH PROPOFOL N/A 07/27/2015   Procedure: ESOPHAGOGASTRODUODENOSCOPY (EGD) WITH PROPOFOL;  Surgeon: Hulen Luster, MD;  Location: [facility] ENDOSCOPY;  Service: Gastroenterology;  Laterality: N/A;  . ESOPHAGOGASTRODUODENOSCOPY (EGD) WITH PROPOFOL N/A 05/19/2018   Procedure: ESOPHAGOGASTRODUODENOSCOPY (EGD) WITH PROPOFOL;  Surgeon: Lucilla Lame, MD;  Location: [facility] ENDOSCOPY;  Service: Endoscopy;  Laterality: N/A;  . EYE SURGERY    . FOOT SURGERY Bilateral    5-6 years per patient  . SPINE SURGERY    . TONSILLECTOMY    . VASCULAR SURGERY     LEG STENTS    SOCIAL HISTORY:   Social History   Tobacco Use  . Smoking status: Current Every Day Smoker    Packs/day: 0.50    Years: 50.00    Pack years: 25.00    Types: Cigarettes   . Smokeless tobacco: Never Used  . Tobacco comment: Previously smoked 2 ppd  Substance Use Topics  . Alcohol use: No    Alcohol/week: 0.0 standard drinks    FAMILY HISTORY:   Family History  Problem Relation Age of Onset  . Cancer Mother   . Arthritis Mother   . Heart disease Mother   . Diabetes Mother        mellitus, type 2  . Heart disease Father   . Diabetes Sister   . Cancer Brother   . Cancer Brother        lung  . Diabetes Brother     DRUG ALLERGIES:   Allergies  Allergen Reactions  . Percocet [Oxycodone-Acetaminophen] Hives and Rash  . Aspirin Nausea And Vomiting and Other (See Comments)  . Codeine Nausea And Vomiting, Nausea Only and Other (See Comments)  . Propoxyphene Nausea Only and Other (See Comments)  . Sulfa Antibiotics Rash and Other (See Comments)    REVIEW OF SYSTEMS:  Review of Systems  Constitutional: Negative for chills, fever and weight loss.  HENT: Negative for ear discharge, ear pain and nosebleeds.   Eyes: Negative for blurred vision, pain and discharge.  Respiratory: Positive for cough, sputum production and shortness of breath. Negative for wheezing and stridor.   Cardiovascular: Negative for chest pain, palpitations, orthopnea and PND.  Gastrointestinal: Negative for abdominal pain, diarrhea, nausea and vomiting.  Genitourinary: Negative for frequency and urgency.  Musculoskeletal: Negative for back pain and joint pain.  Neurological: Negative for sensory change, speech change, focal weakness and weakness.  Psychiatric/Behavioral: Negative for depression and hallucinations. The patient is not nervous/anxious.      MEDICATIONS AT HOME:   Prior to Admission medications   Medication Sig Start Date End Date Taking? Authorizing Provider  acetaminophen (TYLENOL) 325 MG tablet Take 2 tablets (650 mg total) by mouth every 6 (six) hours as needed for mild pain (or Fever >/= 101). 05/21/18  Yes Wieting, Richard, MD  albuterol (PROVENTIL) (2.5  MG/3ML) 0.083% nebulizer solution Take 3 mLs (2.5 mg total) by nebulization every 4 (four) hours as needed for wheezing. 12/24/17  Yes Birdie Sons, MD  alendronate (FOSAMAX) 70 MG tablet Take 1 tablet (70 mg total) by mouth once a week. Take with a full glass of water on an empty stomach. 12/04/18  Yes Birdie Sons, MD  ALPRAZolam Duanne Moron) 0.5 MG tablet Take 0.5-1 tablets (0.25-0.5 mg total) by mouth at bedtime as needed for anxiety. 08/19/18  Yes Birdie Sons, MD  amLODipine (NORVASC) 5 MG tablet Take 1 tablet (5 mg total) by mouth daily. 12/09/18  Yes Birdie Sons, MD  atorvastatin (LIPITOR) 40 MG tablet Take 1 tablet (40 mg total) by mouth daily at 6 PM. 11/08/18  Yes Demetrios Loll, MD  baclofen [facility])  10 MG tablet Take 1 tablet (10 mg total) by mouth 3 (three) times daily as needed for muscle spasms. 08/17/18  Yes Triplett, Cari B, FNP  BELSOMRA 5 MG TABS Take 1 tablet by mouth at bedtime as needed (for insomnia). 11/16/18  Yes Birdie Sons, MD  benzonatate (TESSALON PERLES) 100 MG capsule Take 1 capsule (100 mg total) by mouth 3 (three) times daily as needed for cough. 11/10/18  Yes Birdie Sons, MD  celecoxib (CELEBREX) 100 MG capsule Take 100 mg by mouth 2 (two) times daily as needed. 11/23/18  Yes [provider]  clopidogrel (PLAVIX) 75 MG tablet Take 75 mg by mouth daily.   Yes [provider]  diphenoxylate-atropine (LOMOTIL) 2.5-0.025 MG tablet Take 1 tablet by mouth as needed for diarrhea or loose stools. 06/10/18  Yes [provider]  estradiol (ESTRACE) 0.5 MG tablet Take 1 tablet (0.5 mg total) by mouth every other day. 09/09/18  Yes Birdie Sons, MD  famotidine (PEPCID) 20 MG tablet Take 1 tablet (20 mg total) by mouth daily. 12/09/18  Yes Birdie Sons, MD  fentaNYL (DURAGESIC - DOSED MCG/HR) 50 MCG/HR Place 1 patch (50 mcg total) onto the skin every 3 (three) days. 11/29/18 12/29/18 Yes Vevelyn Francois, NP  ferrous sulfate 325 (65 FE)  MG tablet Take 1 tablet (325 mg total) by mouth 2 (two) times daily with a meal. 09/23/18  Yes Birdie Sons, MD  Fluticasone-Salmeterol (ADVAIR DISKUS) 250-50 MCG/DOSE AEPB TAKE ONE PUFF TWICE DAILY. 09/28/18  Yes Birdie Sons, MD  furosemide (LASIX) 20 MG tablet Take 1 tablet (20 mg total) by mouth every other day. 11/08/18  Yes Demetrios Loll, MD  memantine (NAMENDA) 5 MG tablet Take 5 mg by mouth 2 (two) times daily.   Yes [provider]  NARCAN 4 MG/0.1ML LIQD nasal spray kit Place 0.4 mg into the nose daily as needed (for accidental overdose.).  09/11/17  Yes [provider]  ondansetron (ZOFRAN) 4 MG tablet Take 4 mg by mouth as needed. 08/04/18  Yes [provider]  pantoprazole (PROTONIX) 40 MG tablet Take 40 mg by mouth 2 (two) times daily. Take 1 tablet (40 mg) by mouth scheduled every morning, may repeat dose in evening if needed for heartburn/indigestion.   Yes [provider]  predniSONE (DELTASONE) 10 MG tablet Take 1 tablet (10 mg total) by mouth daily with breakfast. 12/15/18  Yes Birdie Sons, MD  pregabalin (LYRICA) 300 MG capsule Take 300 mg by mouth 2 (two) times daily.   Yes [provider]  silver sulfADIAZINE (SILVADENE) 1 % cream Apply 1 application topically daily as needed (leg infection).    Yes [provider]  tiotropium (SPIRIVA HANDIHALER) 18 MCG inhalation capsule inhale the contents of one capsule in the handihaler once daily Patient taking differently: Place 18 mcg into inhaler and inhale daily. inhale the contents of one capsule in the handihaler once daily 01/23/18  Yes Fisher, Kirstie Peri, MD  triamcinolone (KENALOG) 0.025 % ointment Apply 1 application topically 2 (two) times daily. Patient taking differently: Apply 1 application topically 2 (two) times daily as needed (cracking skin).  06/15/18  Yes Birdie Sons, MD  VENTOLIN HFA 108 [phone] Base) MCG/ACT inhaler Inhale 2 puffs into the lungs every 4 (four)  hours as needed for wheezing or shortness of breath. Patient taking differently: Inhale 2 puffs into the lungs every 4 (four) hours as needed for wheezing or shortness of breath.  08/21/18  Yes Birdie Sons, MD  vitamin B-12 (CYANOCOBALAMIN) 1000 MCG tablet Take 1 tablet (1,000 mcg total) by mouth daily. 05/21/18  Yes Wieting, Richard, MD  guaiFENesin (ROBITUSSIN) 100 MG/5ML SOLN Take 5 mLs (100 mg total) by mouth every 4 (four) hours as needed for cough or to loosen phlegm. Patient not taking: Reported on 12/15/2018 11/08/18   Demetrios Loll, MD      VITAL SIGNS:  Blood pressure 117/83, pulse 94, temperature 99.1 F (37.3 C), temperature source Oral, resp. rate 17, height '4\' 10"'$  (1.473 m), weight 49.9 kg, SpO2 100 %.  PHYSICAL EXAMINATION:  GENERAL:  68 y.o.-year-old patient lying in the bed with no acute distress. Thin cachectic EYES: Pupils equal, round, reactive to light and accommodation. No scleral icterus. Extraocular muscles intact.  HEENT: Head atraumatic, normocephalic. Oropharynx and nasopharynx clear.  NECK:  Supple, no jugular venous distention. No thyroid enlargement, no tenderness.  LUNGS: Coarse breath sounds bilaterally, no wheezing, rales,rhonchi or crepitation. No use of accessory muscles of respiration.  CARDIOVASCULAR: S1, S2 normal. No murmurs, rubs, or gallops.  ABDOMEN: Soft, nontender, nondistended. Bowel sounds present. No organomegaly or mass.  EXTREMITIES: No pedal edema, cyanosis, or clubbing.  NEUROLOGIC: Cranial nerves II through XII are intact. Muscle strength 5/5 in all extremities. Sensation intact. Gait not checked.  PSYCHIATRIC: The patient is alert and oriented x 3.  SKIN: No obvious rash, lesion, or ulcer.   LABORATORY PANEL:   CBC Recent Labs  Lab 12/21/18 1516  WBC 12.1*  HGB 10.7*  HCT 33.8*  PLT 186   ------------------------------------------------------------------------------------------------------------------  Chemistries  Recent Labs   Lab 12/21/18 1516  NA 137  K 4.7  CL 104  CO2 28  GLUCOSE 92  BUN 16  CREATININE 0.94  CALCIUM 8.6*   ------------------------------------------------------------------------------------------------------------------  Cardiac Enzymes Recent Labs  Lab 12/21/18 1516  TROPONINI <0.03   ------------------------------------------------------------------------------------------------------------------  RADIOLOGY:  Dg Chest 2 View  Result Date: 12/21/2018 CLINICAL DATA:  68 year old female with a history of fever EXAM: CHEST - 2 VIEW COMPARISON:  12/15/2018 FINDINGS: Cardiomediastinal silhouette unchanged. Double density over the lower mediastinum is unchanged. No pneumothorax. No pleural effusion. Opacity overlying the right heart border with a correlate on the lateral view. Degenerative changes of the spine.  No displaced fracture. IMPRESSION: Evidence of right-sided lobar pneumonia. Followup PA and lateral chest X-ray is recommended in 3-4 weeks following trial of antibiotic therapy to ensure resolution. Hiatal hernia Electronically Signed   By: Corrie Mckusick D.O.   On: 12/21/2018 15:45    EKG:    IMPRESSION AND PLAN:  Milina Pagett  is a 68 y.o. female with a known history of betrayal failure with COPD and ongoing tobacco abuse uses 3 L nasal cannula oxygen recently treated for aspiration pneumonia in December 2019 comes to the emergency room after she started having cough with productive phlegm and shortness of breath with some hypoxia at home.  1. Chronic hypoxic respiratory failure secondary to recurrent aspiration pneumonia -patient currently is not in respiratory distress will hold off steroids -IV Unasyn recurrent aspiration pneumonia -speech therapist to see -follow blood culture -continue nebulizer, inhalers if needed  2. COPD with ongoing tobacco abuse -hold off steroids patient not currently in respiratory distress -PRN nebs and inhalers  3. Leukocytosis due to  pneumonia  4. DVT prophylaxis subcu Lovenox   All the records are reviewed and case discussed with ED provider.   CODE STATUS: full  TOTAL TIME TAKING CARE OF THIS PATIENT: *68*  minutes.    Fritzi Mandes M.D on 12/21/2018 at 6:11 PM  Between 7am to 6pm - Pager - [phone]  After 6pm go to [email or web address] - password [email or web address] [facility]  [facility]  Office  [phone]  CC: Primary care physician; Birdie Sons, MD

## 2018-12-21 NOTE — Progress Notes (Signed)
Family Meeting Note  Advance Directiveyes Today a meeting took place with the pt  Patient comes in with increasing shortness of breath on chronic home oxygen for COPD with ongoing tobacco abuse found to have right lower lobe pneumonia recurrent appears aspiration. Code status address patient wants to be full code. Sister present in the room.  Time spent 16 minutes Enedina Finner, MD

## 2018-12-21 NOTE — ED Notes (Signed)
Pt O2 sat 89% on RA. Placed on 2L with O2 increase to 98%. Pt states she uses up to 3L at home PRN.

## 2018-12-22 ENCOUNTER — Telehealth: Payer: Self-pay | Admitting: Family Medicine

## 2018-12-22 ENCOUNTER — Telehealth: Payer: Self-pay

## 2018-12-22 MED ORDER — SODIUM CHLORIDE 0.9 % IV SOLN
INTRAVENOUS | Status: DC | PRN
  Administered 2018-12-22: 02:00:00 via INTRAVENOUS

## 2018-12-22 MED ORDER — SODIUM CHLORIDE 0.9 % IV SOLN
3.0000 g | Freq: Four times a day (QID) | INTRAVENOUS | Status: DC
  Administered 2018-12-22: 3 g via INTRAVENOUS
  Filled 2018-12-22 (×3): qty 3

## 2018-12-22 MED ORDER — AMOXICILLIN-POT CLAVULANATE 875-125 MG PO TABS: 1.0000 | ORAL_TABLET | Freq: Two times a day (BID) | ORAL | 0 refills | Status: DC

## 2018-12-22 NOTE — Telephone Encounter (Signed)
[facility] calling to make an 1 week Hospital F/U for pt with Dr Sherrie MustacheFisher. Please call pt back to make an appt that Dr. Sherrie MustacheFisher can work her in for this appt.  Thanks, [employer]

## 2018-12-22 NOTE — Telephone Encounter (Signed)
Bhavik advised.   Thanks,   -Vernona Rieger

## 2018-12-22 NOTE — Telephone Encounter (Signed)
That's fine

## 2018-12-22 NOTE — Progress Notes (Signed)
Discharge order received. Patient is alert and oriented. Vital signs stable . No signs of acute distress. Discharge instructions given. Patient verbalized understanding. No other issues noted at this time.   

## 2018-12-22 NOTE — Telephone Encounter (Signed)
Can she use 12/24/2018 at 9am or 10am?  Thanks,   -Vernona RiegerLaura

## 2018-12-22 NOTE — Discharge Summary (Signed)
[facility] [place] [facility]   PATIENT NAME: Cheryl [name]    MR#:  [id number]  DATE OF BIRTH:  03/24/1951  DATE OF ADMISSION:  12/21/2018 ADMITTING PHYSICIAN: Sona Patel, MD  DATE OF DISCHARGE: 12/22/2018  PRIMARY CARE PHYSICIAN: Fisher, Donald E, MD    ADMISSION DIAGNOSIS:  Hypoxia [R09.02] Pneumonia of right lower lobe due to infectious organism (HCC) [J18.1] Sepsis, due to unspecified organism, unspecified whether acute organ dysfunction present (HCC) [A41.9]  DISCHARGE DIAGNOSIS:  Active Problems:   Pneumonia   SECONDARY DIAGNOSIS:   Past Medical History:  Diagnosis Date  . Acute postoperative pain 04/14/2017  . Allergy   . Anemia   . Anxiety   . Arthritis    fingers  . Aspiration pneumonitis (HCC) 11/24/2015  . Asthma   . CHF (congestive heart failure) (HCC)   . Chronic pain   . COPD (chronic obstructive pulmonary disease) (HCC)   . Coronary artery disease   . DVT (deep venous thrombosis) (HCC)   . Dyspnea   . Dysrhythmia   . Encephalopathy    MULTIPLE TIMES  . GERD (gastroesophageal reflux disease)   . Headache   . Hyperlipidemia   . Hypertension   . Kyphoscoliosis deformity of spine   . Migraines   . Neuropathy 2010  . Osteoporosis   . Oxygen deficiency   . Peripheral vascular disease (HCC)   . Pneumonia   . Pneumonia 11/19/2015  . Pneumonia 10/2016  . Pneumonia    aspiration 2018- 4 times in last year  . Rib fracture 09/19/2017   Displace right 9th rib fracture.   . Vitamin D deficiency   . Wears dentures    full upper and lower    HOSPITAL COURSE:   87-year-old female with past medical history of COPD, ongoing tobacco abuse, chronic respiratory failure, GERD, osteoporosis, peripheral vascular disease, chronic pain who presented to the hospital due to shortness of breath, cough and some hypoxemia and noted to have pneumonia.  1.  Acute on chronic respiratory failure with hypoxia- secondary to pneumonia. - Patient was  admitted to the hospital treated with IV Unasyn for her suspected pneumonia which was thought to be aspiration pneumonia.  She did not have an acute exacerbation of her COPD therefore was not given steroids but maintained on some nebulizer treatments. - Patient has clinically improved overnight with treatment for her pneumonia and is no longer hypoxic and is ambulating well without any worsening dyspnea. - Patient is being discharged home on oral Augmentin for her pneumonia.  2.  Pneumonia-suspected to be source of patient's worsening respiratory failure with hypoxia.  This was suspected to be aspiration pneumonia. - Seen by speech therapy and they recommended continuing a dysphasia 3 diet with thin liquids and aspiration precautions. -Patient was treated with IV Unasyn and is now being discharged on oral Augmentin.  She is afebrile and hemodynamically stable.  3.  COPD-no acute exacerbation due to pneumonia. -Patient will continue her Spiriva, albuterol inhaler as needed and Advair.  4.  GERD-patient will continue her Pepcid, Protonix.  5.  History of peripheral vascular disease-patient will continue her Plavix, atorvastatin.   6.  Anxiety-patient will continue her Xanax as needed.  7.  Hyperlipidemia-patient will continue atorvastatin.  8.  Neuropathy-patient will continue her Lyrica.  9.  Chronic pain-patient will continue her fentanyl patch.  DISCHARGE CONDITIONS:   Stable.   CONSULTS OBTAINED:    DRUG ALLERGIES:   Allergies  Allergen Reactions  . Percocet [  Oxycodone-Acetaminophen] Hives and Rash  . Aspirin Nausea And Vomiting and Other (See Comments)  . Codeine Nausea And Vomiting, Nausea Only and Other (See Comments)  . Propoxyphene Nausea Only and Other (See Comments)  . Sulfa Antibiotics Rash and Other (See Comments)    DISCHARGE MEDICATIONS:   Allergies as of 12/22/2018      Reactions   Percocet [oxycodone-acetaminophen] Hives, Rash   Aspirin Nausea And Vomiting,  Other (See Comments)   Codeine Nausea And Vomiting, Nausea Only, Other (See Comments)   Propoxyphene Nausea Only, Other (See Comments)   Sulfa Antibiotics Rash, Other (See Comments)      Medication List    STOP taking these medications   guaiFENesin 100 MG/5ML Soln Commonly known as:  ROBITUSSIN     TAKE these medications   acetaminophen 325 MG tablet Commonly known as:  TYLENOL Take 2 tablets (650 mg total) by mouth every 6 (six) hours as needed for mild pain (or Fever >/= 101).   albuterol (2.5 MG/3ML) 0.083% nebulizer solution Commonly known as:  PROVENTIL Take 3 mLs (2.5 mg total) by nebulization every 4 (four) hours as needed for wheezing. What changed:  Another medication with the same name was changed. Make sure you understand how and when to take each.   VENTOLIN HFA 108 (90 Base) MCG/ACT inhaler Generic drug:  albuterol Inhale 2 puffs into the lungs every 4 (four) hours as needed for wheezing or shortness of breath. What changed:  See the new instructions.   alendronate 70 MG tablet Commonly known as:  FOSAMAX Take 1 tablet (70 mg total) by mouth once a week. Take with a full glass of water on an empty stomach.   ALPRAZolam 0.5 MG tablet Commonly known as:  XANAX Take 0.5-1 tablets (0.25-0.5 mg total) by mouth at bedtime as needed for anxiety.   amLODipine 5 MG tablet Commonly known as:  NORVASC Take 1 tablet (5 mg total) by mouth daily.   amoxicillin-clavulanate 875-125 MG tablet Commonly known as:  AUGMENTIN Take 1 tablet by mouth 2 (two) times daily for 7 days.   atorvastatin 40 MG tablet Commonly known as:  LIPITOR Take 1 tablet (40 mg total) by mouth daily at 6 PM.   baclofen 10 MG tablet Commonly known as:  LIORESAL Take 1 tablet (10 mg total) by mouth 3 (three) times daily as needed for muscle spasms.   BELSOMRA 5 MG Tabs Generic drug:  Suvorexant Take 1 tablet by mouth at bedtime as needed (for insomnia).   benzonatate 100 MG capsule Commonly  known as:  TESSALON PERLES Take 1 capsule (100 mg total) by mouth 3 (three) times daily as needed for cough.   celecoxib 100 MG capsule Commonly known as:  CELEBREX Take 100 mg by mouth 2 (two) times daily as needed.   clopidogrel 75 MG tablet Commonly known as:  PLAVIX Take 75 mg by mouth daily.   diphenoxylate-atropine 2.5-0.025 MG tablet Commonly known as:  LOMOTIL Take 1 tablet by mouth as needed for diarrhea or loose stools.   estradiol 0.5 MG tablet Commonly known as:  ESTRACE Take 1 tablet (0.5 mg total) by mouth every other day.   famotidine 20 MG tablet Commonly known as:  PEPCID Take 1 tablet (20 mg total) by mouth daily.   fentaNYL 50 MCG/HR Commonly known as:  DURAGESIC Place 1 patch (50 mcg total) onto the skin every 3 (three) days.   ferrous sulfate 325 (65 FE) MG tablet Take 1 tablet (325 mg total)   by mouth 2 (two) times daily with a meal.   Fluticasone-Salmeterol 250-50 MCG/DOSE Aepb Commonly known as:  ADVAIR DISKUS TAKE ONE PUFF TWICE DAILY.   furosemide 20 MG tablet Commonly known as:  LASIX Take 1 tablet (20 mg total) by mouth every other day.   memantine 5 MG tablet Commonly known as:  NAMENDA Take 5 mg by mouth 2 (two) times daily.   NARCAN 4 MG/0.1ML Liqd nasal spray kit Generic drug:  naloxone Place 0.4 mg into the nose daily as needed (for accidental overdose.).   ondansetron 4 MG tablet Commonly known as:  ZOFRAN Take 4 mg by mouth as needed.   pantoprazole 40 MG tablet Commonly known as:  PROTONIX Take 40 mg by mouth 2 (two) times daily. Take 1 tablet (40 mg) by mouth scheduled every morning, may repeat dose in evening if needed for heartburn/indigestion.   predniSONE 10 MG tablet Commonly known as:  DELTASONE Take 1 tablet (10 mg total) by mouth daily with breakfast.   pregabalin 300 MG capsule Commonly known as:  LYRICA Take 300 mg by mouth 2 (two) times daily.   silver sulfADIAZINE 1 % cream Commonly known as:   SILVADENE Apply 1 application topically daily as needed (leg infection).   tiotropium 18 MCG inhalation capsule Commonly known as:  SPIRIVA HANDIHALER inhale the contents of one capsule in the handihaler once daily What changed:    how much to take  how to take this  when to take this   triamcinolone 0.025 % ointment Commonly known as:  KENALOG Apply 1 application topically 2 (two) times daily. What changed:    when to take this  reasons to take this   vitamin B-12 1000 MCG tablet Commonly known as:  CYANOCOBALAMIN Take 1 tablet (1,000 mcg total) by mouth daily.         DISCHARGE INSTRUCTIONS:   DIET:  Cardiac diet  DISCHARGE CONDITION:  Stable  ACTIVITY:  Activity as tolerated  OXYGEN:  Home Oxygen: Yes.     Oxygen Delivery: 2 liters/min via Patient connected to nasal cannula oxygen  DISCHARGE LOCATION:  home with resumption of home Health services.   If you experience worsening of your admission symptoms, develop shortness of breath, life threatening emergency, suicidal or homicidal thoughts you must seek medical attention immediately by calling 911 or calling your MD immediately  if symptoms less severe.  You Must read complete instructions/literature along with all the possible adverse reactions/side effects for all the Medicines you take and that have been prescribed to you. Take any new Medicines after you have completely understood and accpet all the possible adverse reactions/side effects.   Please note  You were cared for by a hospitalist during your hospital stay. If you have any questions about your discharge medications or the care you received while you were in the hospital after you are discharged, you can call the unit and asked to speak with the hospitalist on call if the hospitalist that took care of you is not available. Once you are discharged, your primary care physician will handle any further medical issues. Please note that NO REFILLS  for any discharge medications will be authorized once you are discharged, as it is imperative that you return to your primary care physician (or establish a relationship with a primary care physician if you do not have one) for your aftercare needs so that they can reassess your need for medications and monitor your lab values.     Today     No acute events overnight, shortness of breath improved since admission.  Patient would like to be discharged home on oral antibiotics.  Patient was strongly advised to quit smoking.  VITAL SIGNS:  Blood pressure (!) 166/88, pulse (!) 111, temperature 98.1 F (36.7 C), temperature source Oral, resp. rate 20, height 4' 10" (1.473 m), weight 49.9 kg, SpO2 96 %.  I/O:    Intake/Output Summary (Last 24 hours) at 12/22/2018 1323 Last data filed at 12/22/2018 1017 Gross per 24 hour  Intake 155.75 ml  Output 1000 ml  Net -844.25 ml    PHYSICAL EXAMINATION:  GENERAL:  67 y.o.-year-old thin patient lying in bed in no acute distress.  EYES: Pupils equal, round, reactive to light and accommodation. No scleral icterus. Extraocular muscles intact.  HEENT: Head atraumatic, normocephalic. Oropharynx and nasopharynx clear.  NECK:  Supple, no jugular venous distention. No thyroid enlargement, no tenderness.  LUNGS: Good a/e b/l, Diffuse basilar rhonchi, crackles, no wheezing. No use of accessory muscles.   CARDIOVASCULAR: S1, S2 normal. No murmurs, rubs, or gallops.  ABDOMEN: Soft, non-tender, non-distended. Bowel sounds present. No organomegaly or mass.  EXTREMITIES: No pedal edema, cyanosis, or clubbing.  NEUROLOGIC: Cranial nerves II through XII are intact. No focal motor or sensory defecits b/l.  PSYCHIATRIC: The patient is alert and oriented x 3.  SKIN: No obvious rash, lesion, or ulcer.   DATA REVIEW:   CBC Recent Labs  Lab 12/21/18 1516  WBC 12.1*  HGB 10.7*  HCT 33.8*  PLT 186    Chemistries  Recent Labs  Lab 12/21/18 1516  NA 137  K 4.7   CL 104  CO2 28  GLUCOSE 92  BUN 16  CREATININE 0.94  CALCIUM 8.6*    Cardiac Enzymes Recent Labs  Lab 12/21/18 1516  TROPONINI <0.03    Microbiology Results  Results for orders placed or performed during the hospital encounter of 12/21/18  Blood Culture (routine x 2)     Status: None (Preliminary result)   Collection Time: 12/21/18  5:13 PM  Result Value Ref Range Status   Specimen Description BLOOD LEFT ANTECUBITAL  Final   Special Requests   Final    BOTTLES DRAWN AEROBIC AND ANAEROBIC Blood Culture results may not be optimal due to an excessive volume of blood received in culture bottles   Culture   Final    NO GROWTH < 12 HOURS Performed at [facility], [address] [place], [place] [address]    Report Status PENDING  Incomplete  Blood Culture (routine x 2)     Status: None (Preliminary result)   Collection Time: 12/21/18  5:13 PM  Result Value Ref Range Status   Specimen Description BLOOD RIGHT ANTECUBITAL  Final   Special Requests   Final    BOTTLES DRAWN AEROBIC AND ANAEROBIC Blood Culture results may not be optimal due to an excessive volume of blood received in culture bottles   Culture   Final    NO GROWTH < 12 HOURS Performed at [facility], [address] [place],  [address]    Report Status PENDING  Incomplete    RADIOLOGY:  Dg Chest 2 View  Result Date: 12/21/2018 CLINICAL DATA:  67-year-old female with a history of fever EXAM: CHEST - 2 VIEW COMPARISON:  12/15/2018 FINDINGS: Cardiomediastinal silhouette unchanged. Double density over the lower mediastinum is unchanged. No pneumothorax. No pleural effusion. Opacity overlying the right heart border with a correlate on the lateral view. Degenerative changes of   the spine.  No displaced fracture. IMPRESSION: Evidence of right-sided lobar pneumonia. Followup PA and lateral chest X-ray is recommended in 3-4 weeks following trial of antibiotic therapy to ensure resolution. Hiatal  hernia Electronically Signed   By: Corrie Mckusick D.O.   On: 12/21/2018 15:45      Management plans discussed with the patient, family and they are in agreement.  CODE STATUS:     Code Status Orders  (From admission, onward)         Start     Ordered   12/21/18 1827  Full code  Continuous     12/21/18 1826          TOTAL TIME TAKING CARE OF THIS PATIENT: 40 minutes.    Henreitta Leber M.D on 12/22/2018 at 1:23 PM  Between 7am to 6pm - Pager - [phone]  After 6pm go to [email or web address] - Proofreader  [facility]  Office  [phone]  CC: Primary care physician; Birdie Sons, MD

## 2018-12-22 NOTE — Evaluation (Signed)
Clinical/Bedside Swallow Evaluation Patient Details  Name: Cheryl [name] MRN: [id number] Date of Birth: [birth year]  Today's Date: 12/22/2018 Time: SLP Start Time (ACUTE ONLY): 0845 SLP Stop Time (ACUTE ONLY): 0930 SLP Time Calculation (min) (ACUTE ONLY): 45 min  Past Medical History:  Past Medical History:  Diagnosis Date  . Acute postoperative pain 04/14/2017  . Allergy   . Anemia   . Anxiety   . Arthritis    fingers  . Aspiration pneumonitis (HCC) 11/24/2015  . Asthma   . CHF (congestive heart failure) (HCC)   . Chronic pain   . COPD (chronic obstructive pulmonary disease) (HCC)   . Coronary artery disease   . DVT (deep venous thrombosis) (HCC)   . Dyspnea   . Dysrhythmia   . Encephalopathy    MULTIPLE TIMES  . GERD (gastroesophageal reflux disease)   . Headache   . Hyperlipidemia   . Hypertension   . Kyphoscoliosis deformity of spine   . Migraines   . Neuropathy 2010  . Osteoporosis   . Oxygen deficiency   . Peripheral vascular disease (HCC)   . Pneumonia   . Pneumonia 11/19/2015  . Pneumonia 10/2016  . Pneumonia    aspiration 2018- 4 times in last year  . Rib fracture 09/19/2017   Displace right 9th rib fracture.   . Vitamin D deficiency   . Wears dentures    full upper and lower   Past Surgical History:  Past Surgical History:  Procedure Laterality Date  . ABDOMINAL HYSTERECTOMY  1975   Bilaterl Oophorectomy; Dur to IUD infection  . abdomnal aortic stent  05/30/2008   Dr. Nanetta Batty  . APPENDECTOMY    . APPENDECTOMY    . BACK SURGERY    . cardiac catherization  10/31/2009  . CARDIAC CATHETERIZATION    . CATARACT EXTRACTION W/PHACO Left 08/20/2018   Procedure: CATARACT EXTRACTION PHACO AND INTRAOCULAR LENS PLACEMENT (IOC);  Surgeon: Nevada Crane, MD;  Location: [facility] ORS;  Service: Ophthalmology;  Laterality: Left;  [place] 00:45.1 AP% 12.3 CDE 5.54 FLUID PACK LOT # [id number]  . CATARACT EXTRACTION W/PHACO Right 09/23/2018   Procedure: CATARACT  EXTRACTION PHACO AND INTRAOCULAR LENS PLACEMENT (IOC);  Surgeon: Nevada Crane, MD;  Location: [facility] ORS;  Service: Ophthalmology;  Laterality: Right;  [place] 00:45.0 CDE 5.19 Fluid Pack lot # [id number]  . CERVICAL FUSION  C5 - 6/C6-7  . CHOLECYSTECTOMY  1972  . COLONOSCOPY WITH PROPOFOL N/A 07/27/2015   Procedure: COLONOSCOPY WITH PROPOFOL;  Surgeon: Wallace Cullens, MD;  Location: [facility] ENDOSCOPY;  Service: Gastroenterology;  Laterality: N/A;  . ESOPHAGOGASTRODUODENOSCOPY (EGD) WITH PROPOFOL N/A 07/27/2015   Procedure: ESOPHAGOGASTRODUODENOSCOPY (EGD) WITH PROPOFOL;  Surgeon: Wallace Cullens, MD;  Location: [facility] ENDOSCOPY;  Service: Gastroenterology;  Laterality: N/A;  . ESOPHAGOGASTRODUODENOSCOPY (EGD) WITH PROPOFOL N/A 05/19/2018   Procedure: ESOPHAGOGASTRODUODENOSCOPY (EGD) WITH PROPOFOL;  Surgeon: Midge Minium, MD;  Location: [facility] ENDOSCOPY;  Service: Endoscopy;  Laterality: N/A;  . EYE SURGERY    . FOOT SURGERY Bilateral    5-6 years per patient  . SPINE SURGERY    . TONSILLECTOMY    . VASCULAR SURGERY     LEG STENTS   HPI:  Pt is a 68 y.o. female with a known history of MULTIPLE medical issues including GERD, COPD, CAD, DVT, HLD, Htn, PVD, Anxiety, GERD, and Hiatal Hernia. She has had multiple admissions including for pneumonia, hypoxia. She continues to have ongoing tobacco abuse. She uses 3 L nasal cannula oxygen recently treated  for aspiration pneumonia in July, December 2019. She started having cough with productive phlegm and shortness of breath with some hypoxia at home. OF note, pt does have longstanding Esophageal phase issuses of GERD - suspect this could be related to pt's multiple episodes of Pneumonia(possible regurgitation?). Also of note, per Chest CT and CXRs in chart notes, pt has a Large Hiatal Hernia.    Assessment / Plan / Recommendation Clinical Impression  Pt was reluctant to participate in "another test" but agreed to do so w/ SLP after min encouragement and education on the  origins of some swallowing problems, including the impact of Esophageal issues/dymotility on swallowing as well as Pulmonary impact and issues such as pneumonia. Pt has a h/o oropharyngeal phase dysphagia per MBSS in 10/2015 which revealed "swallow delay resulted in deep laryngeal penetration to the level of the vocal cords with thin liquids. This penetration was silent and was not observed to clear by ST". Pt also has a longstanding h/o Esophageal phase dysmotility including GERD w/ "acid reflux that burns me sometimes" and a Large Hiatal Hernia. Husband and pt reported pt having episodes of increased phlegm which "chokes me" causing her to have N/V. She stated she feels "full" after only 2-4 bites of food. Pt consumed the few po trials of thin liquids via Cup ("I do not use straws since you told me not to before" at this bedside evaluation w/ no imediate, overt s/s of aspiration noted - however, a mild throat clearing was noted b/t trials when pt responded verbally to SLP and Husband. Also, silent laryngeal penetration would not be noted at a bedside evaluation. No gross oral phase deficits noted w/ the trials accepted; pt declined further. When ST services were explained further in regards to another objective swallow study(MBSS), pt immediately stated "No". When asked again if she would want to do a MBSS or drink thickened liquids again as recommended before, pt again stated "No". Discussed strict aspiration precautions w/ pt and Husband; discussed impact of Esophageal phase dysmotility and recommended f/u w/ GI to learn more about the Hiatal Hernia and impact of such along w/ GERD on the overall swallowing as well as Pulmonary impact if Reflux is aspirated. Evaluation findings and thoughts discussed w/ MD/NSG and explained pt's risk for aspiration from a pharyngeal and Esophageal phase standpoint as well as prior dysphagia and denying need for further assessment and thickened liquids if recommended. Recommended  to NSG to give Pills Whole in Puree if pt was agreeable for safer swallowing and Esophageal clearing. ST services will sign off at this time. MD agreed. Pt and Husband agreed.  SLP Visit Diagnosis: Dysphagia, oropharyngeal phase (R13.12);Dysphagia, pharyngoesophageal phase (R13.14)(Esophageal phase dysmotility)    Aspiration Risk  Mild aspiration risk;Risk for inadequate nutrition/hydration    Diet Recommendation  recommend a more easily masticated diet d/t lacking bottom dentition - Mech Soft/Minced diet for allow for full mastication of solids before swallowing; thin liquids w/ strict aspiration precautions; REFLUX precautions. Monitoring of pt's Pulmonary status for any decline. F/u w/ GI services for further Esophageal phase management of s/s of dysmotility.   Medication Administration: Whole meds with puree(for safer swallowing)    Other  Recommendations Recommended Consults: Consider GI evaluation;Consider esophageal assessment(Dietician f/u) Oral Care Recommendations: Oral care BID;Patient independent with oral care   Follow up Recommendations None(pt declined any services)      Frequency and Duration (n/a)  (n/a)       Prognosis Prognosis for Safe Diet Advancement: Fair Barriers  to Reach Goals: Time post onset;Severity of deficits(baseline issues)      Swallow Study   General Date of Onset: 12/21/18 HPI: Pt is a 68 y.o. female with a known history of MULTIPLE medical issues including GERD, COPD, CAD, DVT, HLD, Htn, PVD, Anxiety, GERD, and Hiatal Hernia. She has had multiple admissions including for pneumonia, hypoxia. She continues to have ongoing tobacco abuse. She uses 3 L nasal cannula oxygen recently treated for aspiration pneumonia in July, December 2019. She started having cough with productive phlegm and shortness of breath with some hypoxia at home. OF note, pt does have longstanding Esophageal phase issuses of GERD - suspect this could be related to pt's multiple episodes  of Pneumonia(possible regurgitation?). Also of note, per Chest CT and CXRs in chart notes, pt has a Large Hiatal Hernia.  Type of Study: Bedside Swallow Evaluation Previous Swallow Assessment: 05/2018; MBSS in 2016 Diet Prior to this Study: Dysphagia 3 (soft);Thin liquids(at home, per pt ) Temperature Spikes Noted: No(wbc elevated 12.1) Respiratory Status: Nasal cannula(2 liters baseline) History of Recent Intubation: No Behavior/Cognition: Alert;Cooperative;Pleasant mood(required encouragement to participate) Oral Cavity Assessment: Within Functional Limits Oral Care Completed by SLP: Recent completion by staff Oral Cavity - Dentition: Dentures, top(no bottom dentition) Vision: Functional for self-feeding Self-Feeding Abilities: Able to feed self Patient Positioning: Upright in bed Baseline Vocal Quality: Normal Volitional Cough: Strong Volitional Swallow: Able to elicit    Oral/Motor/Sensory Function Overall Oral Motor/Sensory Function: Within functional limits   Ice Chips Ice chips: Not tested   Thin Liquid Thin Liquid: Within functional limits Presentation: Cup;Self Fed(5 sips total accepted) Other Comments: mild congested throat clearing x1 post responded to SLP    Nectar Thick Nectar Thick Liquid: Not tested   Honey Thick Honey Thick Liquid: Not tested   Puree Puree: Not tested   Solid     Solid: Not tested       Jerilynn Som, MS, CCC-SLP Keundra Petrucelli 12/22/2018,3:48 PM

## 2018-12-22 NOTE — Care Management Note (Signed)
Case Management Note  Patient Details  Name: Cheryl [name] MRN: [id number] Date of Birth: 1950/12/24   Patient admitted from home with PNA.  Patient lives at home with husband. Chronic O2.  Resumption orders have been placed.  Barbara Cower with [facility] notified of discharge  Subjective/Objective:                    Action/Plan:   Expected Discharge Date:  12/22/18               Expected Discharge Plan:  Home w Home Health Services  In-House Referral:     Discharge planning Services  CM Consult  Post Acute Care Choice:  Home Health, Resumption of Svcs/PTA Provider Choice offered to:     DME Arranged:    DME Agency:     HH Arranged:  RN, PT, Nurse's Aide HH Agency:  [facility]  Status of Service:  Completed, signed off  If discussed at Long Length of Stay Meetings, dates discussed:    Additional Comments:  Chapman Fitch, RN 12/22/2018, 2:11 PM

## 2018-12-22 NOTE — Telephone Encounter (Signed)
[facility] Physical Therapist Domenica Fail called to ask if Dr. Sherrie Mustache would sign an order to cover patients physical therapy visit on 12/07/2018. Jim Desanctis says that patient has been a long time patient who receives physical therapy. Patients previous order for physical therapy ended on 12/05/2018, and her re- certification for physical therapy wasn't done until 12/09/2018. Jim Desanctis says that patient was seen by a physical Therapist on 12/07/2018 (which falls between the time where there was no certification from patients PCP). Jim Desanctis is faxing oders to cover the physical therapy days that were in the gap. Jim Desanctis wants to know if Dr. Sherrie Mustache will sign the orders. Call back is [phone].

## 2018-12-23 ENCOUNTER — Telehealth: Payer: Self-pay

## 2018-12-23 DIAGNOSIS — G609 Hereditary and idiopathic neuropathy, unspecified: Secondary | ICD-10-CM | POA: Diagnosis not present

## 2018-12-23 DIAGNOSIS — F17210 Nicotine dependence, cigarettes, uncomplicated: Secondary | ICD-10-CM | POA: Diagnosis not present

## 2018-12-23 DIAGNOSIS — M4180 Other forms of scoliosis, site unspecified: Secondary | ICD-10-CM | POA: Diagnosis not present

## 2018-12-23 DIAGNOSIS — I2510 Atherosclerotic heart disease of native coronary artery without angina pectoris: Secondary | ICD-10-CM | POA: Diagnosis not present

## 2018-12-23 DIAGNOSIS — J449 Chronic obstructive pulmonary disease, unspecified: Secondary | ICD-10-CM | POA: Diagnosis not present

## 2018-12-23 DIAGNOSIS — I10 Essential (primary) hypertension: Secondary | ICD-10-CM | POA: Diagnosis not present

## 2018-12-23 DIAGNOSIS — G8929 Other chronic pain: Secondary | ICD-10-CM | POA: Diagnosis not present

## 2018-12-23 DIAGNOSIS — I739 Peripheral vascular disease, unspecified: Secondary | ICD-10-CM | POA: Diagnosis not present

## 2018-12-23 DIAGNOSIS — D509 Iron deficiency anemia, unspecified: Secondary | ICD-10-CM | POA: Diagnosis not present

## 2018-12-23 NOTE — Telephone Encounter (Signed)
Called pt to complete TCM call and [facility] nurse was currently at the home visiting with pt. Her name was Huntley Dec and she is requesting a verbal order for pt to have a nurse come out to the home for once a week x 3 weeks, and then once every other week until end of pt's sur-period. Also she is needing a verbal to have a HH aid come out to home to assist with bathing and daily care for once this week, then 3 times a week until end of sur-period. Sara's CB number is [phone]. Please advise, thank you. -MM

## 2018-12-23 NOTE — Telephone Encounter (Signed)
That's fine

## 2018-12-23 NOTE — Telephone Encounter (Signed)
Called and spoke to Cheryl [name] and gave the ok for the verbal orders per Dr Sherrie Mustache.  dbs

## 2018-12-23 NOTE — Telephone Encounter (Signed)
Transition Care Management Follow-up Telephone Call  Date of discharge and from where: [facility] on 12/22/18.  How have you been since you were released from the hospital? Doing better. Still coughing and has yellow sputum. Has lower back pain but pt states that's a chronic issue that she sees the pain clinic for. Pain level is currently a 3. Declines fever, SOB or n/v/d.  Any questions or concerns? No   Items Reviewed:  Did the pt receive and understand the discharge instructions provided? Yes   Medications obtained and verified? Pt declined reviewing over the phone.   Any new allergies since your discharge? No   Dietary orders reviewed? Yes  Do you have support at home? Yes   Other (ie: DME, Home Health, etc) [facility] for PT. Pt also requesting an order for an aid and nurse to assist with daily living activities.   Functional Questionnaire: (I = Independent and D = Dependent)  Bathing/Dressing- D    Meal Prep- D  Eating- I  Maintaining continence- I  Transferring/Ambulation- Uses a walker and wheelchair as needed.  Managing Meds- D   Follow up appointments reviewed:    PCP Hospital f/u appt confirmed? Yes  Scheduled to see Dr Sherrie Mustache on 12/24/18 @ 10:00 AM.  Specialist Hospital f/u appt confirmed? N/A   Are transportation arrangements needed? No   If their condition worsens, is the pt aware to call  their PCP or go to the ED? Yes  Was the patient provided with contact information for the PCP's office or ED? Yes  Was the pt encouraged to call back with questions or concerns? Yes

## 2018-12-24 ENCOUNTER — Encounter: Payer: Self-pay | Admitting: Family Medicine

## 2018-12-24 ENCOUNTER — Ambulatory Visit (INDEPENDENT_AMBULATORY_CARE_PROVIDER_SITE_OTHER): Payer: Medicare HMO | Admitting: Family Medicine

## 2018-12-24 VITALS — BP 138/72 | HR 76 | Temp 98.4°F

## 2018-12-24 DIAGNOSIS — J189 Pneumonia, unspecified organism: Secondary | ICD-10-CM | POA: Diagnosis not present

## 2018-12-24 DIAGNOSIS — M6281 Muscle weakness (generalized): Secondary | ICD-10-CM | POA: Diagnosis not present

## 2018-12-24 DIAGNOSIS — J690 Pneumonitis due to inhalation of food and vomit: Secondary | ICD-10-CM

## 2018-12-24 DIAGNOSIS — K449 Diaphragmatic hernia without obstruction or gangrene: Secondary | ICD-10-CM | POA: Diagnosis not present

## 2018-12-24 DIAGNOSIS — J449 Chronic obstructive pulmonary disease, unspecified: Secondary | ICD-10-CM | POA: Diagnosis not present

## 2018-12-24 DIAGNOSIS — R4182 Altered mental status, unspecified: Secondary | ICD-10-CM | POA: Diagnosis not present

## 2018-12-24 NOTE — Patient Instructions (Addendum)
Go to the [facility] on [address] for follow up chest Xray on Monday or Tuesday next week (February 2 or 3)   Talk to you gastroenterologist about treatment for large hiatal hernia seen on your chest Xrays and Chest CT scan for last year.   . Please review the attached list of medications and notify my office if there are any errors.

## 2018-12-24 NOTE — Progress Notes (Signed)
Patient: Cheryl [name] Female    DOB: [birth year]   68 y.o.   MRN: [id number] Visit Date: 12/24/2018  Today's Provider: Lelon Huh, MD   Chief Complaint  Patient presents with  . Hospitalization Follow-up  . Pneumonia   Subjective:     HPI    Follow up Hospitalization  Patient was admitted to [facility] on 12/21/2018 and discharged on 12/22/2018. She was treated for Sepsis, Pneumonia and Hypoxia. Was thought to be aspiration pneumonia and started on Unasyn and speech therapy recommend stage 3 dysphagia diet. Discharged on  Augmentin and continue inhalers.  Telephone follow up was done on 12/23/2018 She reports excellent compliance with treatment. She reports this condition is Improved.  Is noted to have large hiatal hernia on CT last year which may increase risk for aspiration. She declined another BMD by speech therapy, but was recommended to seek further evaluation for esophageal phase management.  ------------------------------------------------------------------------------------     Allergies  Allergen Reactions  . Percocet [Oxycodone-Acetaminophen] Hives and Rash  . Aspirin Nausea And Vomiting and Other (See Comments)  . Codeine Nausea And Vomiting, Nausea Only and Other (See Comments)  . Propoxyphene Nausea Only and Other (See Comments)  . Sulfa Antibiotics Rash and Other (See Comments)     Current Outpatient Medications:  .  acetaminophen (TYLENOL) 325 MG tablet, Take 2 tablets (650 mg total) by mouth every 6 (six) hours as needed for mild pain (or Fever >/= 101)., Disp: , Rfl:  .  albuterol (PROVENTIL) (2.5 MG/3ML) 0.083% nebulizer solution, Take 3 mLs (2.5 mg total) by nebulization every 4 (four) hours as needed for wheezing., Disp: 50 vial, Rfl: 12 .  alendronate (FOSAMAX) 70 MG tablet, Take 1 tablet (70 mg total) by mouth once a week. Take with a full glass of water on an empty stomach., Disp: 4 tablet, Rfl: 12 .  ALPRAZolam (XANAX) 0.5 MG tablet, Take  0.5-1 tablets (0.25-0.5 mg total) by mouth at bedtime as needed for anxiety., Disp: 30 tablet, Rfl: 5 .  amLODipine (NORVASC) 5 MG tablet, Take 1 tablet (5 mg total) by mouth daily., Disp: 30 tablet, Rfl: 12 .  amoxicillin-clavulanate (AUGMENTIN) 875-125 MG tablet, Take 1 tablet by mouth 2 (two) times daily for 7 days., Disp: 14 tablet, Rfl: 0 .  atorvastatin (LIPITOR) 40 MG tablet, Take 1 tablet (40 mg total) by mouth daily at 6 PM., Disp: 30 tablet, Rfl: 1 .  baclofen (LIORESAL) 10 MG tablet, Take 1 tablet (10 mg total) by mouth 3 (three) times daily as needed for muscle spasms., Disp: 30 tablet, Rfl: 0 .  BELSOMRA 5 MG TABS, Take 1 tablet by mouth at bedtime as needed (for insomnia)., Disp: 30 tablet, Rfl: 5 .  benzonatate (TESSALON PERLES) 100 MG capsule, Take 1 capsule (100 mg total) by mouth 3 (three) times daily as needed for cough., Disp: 20 capsule, Rfl: 3 .  celecoxib (CELEBREX) 100 MG capsule, Take 100 mg by mouth 2 (two) times daily as needed., Disp: , Rfl:  .  clopidogrel (PLAVIX) 75 MG tablet, Take 75 mg by mouth daily., Disp: , Rfl:  .  diphenoxylate-atropine (LOMOTIL) 2.5-0.025 MG tablet, Take 1 tablet by mouth as needed for diarrhea or loose stools., Disp: , Rfl:  .  estradiol (ESTRACE) 0.5 MG tablet, Take 1 tablet (0.5 mg total) by mouth every other day., Disp: 45 tablet, Rfl: 2 .  famotidine (PEPCID) 20 MG tablet, Take 1 tablet (20 mg total) by mouth daily.,  Disp: 30 tablet, Rfl: 12 .  fentaNYL (DURAGESIC - DOSED MCG/HR) 50 MCG/HR, Place 1 patch (50 mcg total) onto the skin every 3 (three) days., Disp: 10 patch, Rfl: 0 .  ferrous sulfate 325 (65 FE) MG tablet, Take 1 tablet (325 mg total) by mouth 2 (two) times daily with a meal., Disp: 60 tablet, Rfl: 3 .  Fluticasone-Salmeterol (ADVAIR DISKUS) 250-50 MCG/DOSE AEPB, TAKE ONE PUFF TWICE DAILY., Disp: 60 each, Rfl: 5 .  furosemide (LASIX) 20 MG tablet, Take 1 tablet (20 mg total) by mouth every other day., Disp: 30 tablet, Rfl: 0 .   memantine (NAMENDA) 5 MG tablet, Take 5 mg by mouth 2 (two) times daily., Disp: , Rfl:  .  NARCAN 4 MG/0.1ML LIQD nasal spray kit, Place 0.4 mg into the nose daily as needed (for accidental overdose.). , Disp: , Rfl:  .  ondansetron (ZOFRAN) 4 MG tablet, Take 4 mg by mouth as needed., Disp: , Rfl:  .  pantoprazole (PROTONIX) 40 MG tablet, Take 40 mg by mouth 2 (two) times daily. Take 1 tablet (40 mg) by mouth scheduled every morning, may repeat dose in evening if needed for heartburn/indigestion., Disp: , Rfl:  .  predniSONE (DELTASONE) 10 MG tablet, Take 1 tablet (10 mg total) by mouth daily with breakfast., Disp: 30 tablet, Rfl: 0 .  pregabalin (LYRICA) 300 MG capsule, Take 300 mg by mouth 2 (two) times daily., Disp: , Rfl:  .  silver sulfADIAZINE (SILVADENE) 1 % cream, Apply 1 application topically daily as needed (leg infection). , Disp: , Rfl:  .  tiotropium (SPIRIVA HANDIHALER) 18 MCG inhalation capsule, inhale the contents of one capsule in the handihaler once daily (Patient taking differently: Place 18 mcg into inhaler and inhale daily. inhale the contents of one capsule in the handihaler once daily), Disp: 30 capsule, Rfl: 12 .  triamcinolone (KENALOG) 0.025 % ointment, Apply 1 application topically 2 (two) times daily. (Patient taking differently: Apply 1 application topically 2 (two) times daily as needed (cracking skin). ), Disp: 15 g, Rfl: 0 .  VENTOLIN HFA 108 (90 Base) MCG/ACT inhaler, Inhale 2 puffs into the lungs every 4 (four) hours as needed for wheezing or shortness of breath. (Patient taking differently: Inhale 2 puffs into the lungs every 4 (four) hours as needed for wheezing or shortness of breath. ), Disp: 18 g, Rfl: 5 .  vitamin B-12 (CYANOCOBALAMIN) 1000 MCG tablet, Take 1 tablet (1,000 mcg total) by mouth daily., Disp: 30 tablet, Rfl: 0  Review of Systems  Constitutional: Positive for fatigue. Negative for activity change, appetite change, chills, diaphoresis, fever and  unexpected weight change.  Respiratory: Positive for cough and shortness of breath. Negative for apnea, choking, chest tightness, wheezing and stridor.   Gastrointestinal: Negative.   Neurological: Negative for dizziness, light-headedness and headaches.    Social History   Tobacco Use  . Smoking status: Current Every Day Smoker    Packs/day: 0.50    Years: 50.00    Pack years: 25.00    Types: Cigarettes  . Smokeless tobacco: Never Used  . Tobacco comment: Previously smoked 2 ppd  Substance Use Topics  . Alcohol use: No    Alcohol/week: 0.0 standard drinks      Objective:   BP 138/72 (BP Location: Left Arm, Patient Position: Sitting, Cuff Size: Normal)   Pulse 76   Temp 98.4 F (36.9 C) (Oral)   SpO2 90%  Vitals:   12/24/18 0955  BP: 138/72  Pulse: 76  Temp: 98.4 F (36.9 C)  TempSrc: Oral  SpO2: 90%     Physical Exam   General Appearance:    Alert, cooperative, no distress  Eyes:    PERRL, conjunctiva/corneas clear, EOM's intact       Lungs:     Clear to auscultation bilaterally, respirations unlabored  Heart:    Regular rate and rhythm  Neurologic:   Awake, alert, oriented x 3. No apparent focal neurological           defect.          Assessment & Plan    1. Aspiration pneumonia, unspecified aspiration pneumonia type, unspecified laterality, unspecified part of lung ([place]) Continue Augmentin, obtain follow up xray in 4-5 days. Large hiatal hernia may be contributing and advised will need to get in with GI for follow up.  - DG Chest 2 View; Future  2. Hiatal hernia Previously followed by [facility], anticipated referral back for esophogeal phase management as recommended by [facility].      Lelon Huh, MD  [facility]

## 2018-12-25 DIAGNOSIS — I10 Essential (primary) hypertension: Secondary | ICD-10-CM | POA: Diagnosis not present

## 2018-12-25 DIAGNOSIS — F17210 Nicotine dependence, cigarettes, uncomplicated: Secondary | ICD-10-CM | POA: Diagnosis not present

## 2018-12-25 DIAGNOSIS — I2510 Atherosclerotic heart disease of native coronary artery without angina pectoris: Secondary | ICD-10-CM | POA: Diagnosis not present

## 2018-12-25 DIAGNOSIS — D509 Iron deficiency anemia, unspecified: Secondary | ICD-10-CM | POA: Diagnosis not present

## 2018-12-25 DIAGNOSIS — G8929 Other chronic pain: Secondary | ICD-10-CM | POA: Diagnosis not present

## 2018-12-25 DIAGNOSIS — I739 Peripheral vascular disease, unspecified: Secondary | ICD-10-CM | POA: Diagnosis not present

## 2018-12-25 DIAGNOSIS — G609 Hereditary and idiopathic neuropathy, unspecified: Secondary | ICD-10-CM | POA: Diagnosis not present

## 2018-12-25 DIAGNOSIS — J449 Chronic obstructive pulmonary disease, unspecified: Secondary | ICD-10-CM | POA: Diagnosis not present

## 2018-12-25 DIAGNOSIS — M4180 Other forms of scoliosis, site unspecified: Secondary | ICD-10-CM | POA: Diagnosis not present

## 2018-12-26 LAB — CULTURE, BLOOD (ROUTINE X 2)
Culture: NO GROWTH
Culture: NO GROWTH

## 2018-12-28 DIAGNOSIS — G609 Hereditary and idiopathic neuropathy, unspecified: Secondary | ICD-10-CM | POA: Diagnosis not present

## 2018-12-28 DIAGNOSIS — I2510 Atherosclerotic heart disease of native coronary artery without angina pectoris: Secondary | ICD-10-CM | POA: Diagnosis not present

## 2018-12-28 DIAGNOSIS — J449 Chronic obstructive pulmonary disease, unspecified: Secondary | ICD-10-CM | POA: Diagnosis not present

## 2018-12-28 DIAGNOSIS — F17210 Nicotine dependence, cigarettes, uncomplicated: Secondary | ICD-10-CM

## 2018-12-28 DIAGNOSIS — G8929 Other chronic pain: Secondary | ICD-10-CM

## 2018-12-28 DIAGNOSIS — I739 Peripheral vascular disease, unspecified: Secondary | ICD-10-CM | POA: Diagnosis not present

## 2018-12-28 DIAGNOSIS — M4180 Other forms of scoliosis, site unspecified: Secondary | ICD-10-CM

## 2018-12-28 DIAGNOSIS — D509 Iron deficiency anemia, unspecified: Secondary | ICD-10-CM | POA: Diagnosis not present

## 2018-12-28 DIAGNOSIS — I10 Essential (primary) hypertension: Secondary | ICD-10-CM | POA: Diagnosis not present

## 2018-12-29 DIAGNOSIS — M4180 Other forms of scoliosis, site unspecified: Secondary | ICD-10-CM | POA: Diagnosis not present

## 2018-12-29 DIAGNOSIS — I739 Peripheral vascular disease, unspecified: Secondary | ICD-10-CM | POA: Diagnosis not present

## 2018-12-29 DIAGNOSIS — I2510 Atherosclerotic heart disease of native coronary artery without angina pectoris: Secondary | ICD-10-CM | POA: Diagnosis not present

## 2018-12-29 DIAGNOSIS — I10 Essential (primary) hypertension: Secondary | ICD-10-CM | POA: Diagnosis not present

## 2018-12-29 DIAGNOSIS — F17210 Nicotine dependence, cigarettes, uncomplicated: Secondary | ICD-10-CM | POA: Diagnosis not present

## 2018-12-29 DIAGNOSIS — G609 Hereditary and idiopathic neuropathy, unspecified: Secondary | ICD-10-CM | POA: Diagnosis not present

## 2018-12-29 DIAGNOSIS — D509 Iron deficiency anemia, unspecified: Secondary | ICD-10-CM | POA: Diagnosis not present

## 2018-12-29 DIAGNOSIS — J449 Chronic obstructive pulmonary disease, unspecified: Secondary | ICD-10-CM | POA: Diagnosis not present

## 2018-12-29 DIAGNOSIS — G8929 Other chronic pain: Secondary | ICD-10-CM | POA: Diagnosis not present

## 2018-12-30 ENCOUNTER — Ambulatory Visit
Admission: RE | Admit: 2018-12-30 | Discharge: 2018-12-30 | Disposition: A | Discharge status: Home / Self Care | Payer: Medicare HMO | Source: Ambulatory Visit | Attending: Family Medicine | Admitting: Family Medicine

## 2018-12-30 ENCOUNTER — Ambulatory Visit
Admission: RE | Admit: 2018-12-30 | Discharge: 2018-12-30 | Disposition: A | Discharge status: Home / Self Care | Payer: Medicare HMO | Source: Home / Self Care | Attending: Family Medicine | Admitting: Family Medicine

## 2018-12-30 DIAGNOSIS — D509 Iron deficiency anemia, unspecified: Secondary | ICD-10-CM | POA: Diagnosis not present

## 2018-12-30 DIAGNOSIS — I739 Peripheral vascular disease, unspecified: Secondary | ICD-10-CM | POA: Diagnosis not present

## 2018-12-30 DIAGNOSIS — I10 Essential (primary) hypertension: Secondary | ICD-10-CM | POA: Diagnosis not present

## 2018-12-30 DIAGNOSIS — J449 Chronic obstructive pulmonary disease, unspecified: Secondary | ICD-10-CM | POA: Diagnosis not present

## 2018-12-30 DIAGNOSIS — J189 Pneumonia, unspecified organism: Secondary | ICD-10-CM | POA: Diagnosis not present

## 2018-12-30 DIAGNOSIS — M4180 Other forms of scoliosis, site unspecified: Secondary | ICD-10-CM | POA: Diagnosis not present

## 2018-12-30 DIAGNOSIS — I2510 Atherosclerotic heart disease of native coronary artery without angina pectoris: Secondary | ICD-10-CM | POA: Diagnosis not present

## 2018-12-30 DIAGNOSIS — J690 Pneumonitis due to inhalation of food and vomit: Secondary | ICD-10-CM | POA: Insufficient documentation

## 2018-12-30 DIAGNOSIS — G609 Hereditary and idiopathic neuropathy, unspecified: Secondary | ICD-10-CM | POA: Diagnosis not present

## 2018-12-30 DIAGNOSIS — G8929 Other chronic pain: Secondary | ICD-10-CM | POA: Diagnosis not present

## 2018-12-30 DIAGNOSIS — F17210 Nicotine dependence, cigarettes, uncomplicated: Secondary | ICD-10-CM | POA: Diagnosis not present

## 2018-12-31 ENCOUNTER — Telehealth: Payer: Self-pay

## 2018-12-31 ENCOUNTER — Other Ambulatory Visit: Payer: Self-pay | Admitting: Family Medicine

## 2018-12-31 DIAGNOSIS — J690 Pneumonitis due to inhalation of food and vomit: Secondary | ICD-10-CM

## 2018-12-31 DIAGNOSIS — K449 Diaphragmatic hernia without obstruction or gangrene: Secondary | ICD-10-CM

## 2018-12-31 DIAGNOSIS — K224 Dyskinesia of esophagus: Secondary | ICD-10-CM

## 2018-12-31 MED ORDER — AMOXICILLIN-POT CLAVULANATE 875-125 MG PO TABS: 1.0000 | ORAL_TABLET | Freq: Two times a day (BID) | ORAL | 0 refills | Status: DC

## 2018-12-31 NOTE — Telephone Encounter (Signed)
-----   Message from Malva Limes, MD sent at 12/31/2018  7:55 AM EST ----- Pneumonia is much better, but not resolved. Need to take another week of Augmentin. Prescription has been send to pharmacy and need to start back today.  Have also sent referral to sarah to her back in with GI due to large hiatal hernia which probably caused her to have aspiration pneumonia.

## 2018-12-31 NOTE — Progress Notes (Unsigned)
GERD, large hiatal hernia, aspiration pneumonia, esophageal dysmotility, see SLT Note from 12/22/2018

## 2018-12-31 NOTE — Telephone Encounter (Signed)
Tried calling; no answer.   Thanks,   -Laura  

## 2019-01-01 ENCOUNTER — Emergency Department: Payer: Medicare HMO

## 2019-01-01 ENCOUNTER — Other Ambulatory Visit: Payer: Self-pay

## 2019-01-01 ENCOUNTER — Inpatient Hospital Stay
Admission: EM | Admit: 2019-01-01 | Discharge: 2019-01-03 | DRG: 178 | Disposition: A | Discharge status: Home Health | Payer: Medicare HMO | Attending: Internal Medicine | Admitting: Internal Medicine

## 2019-01-01 ENCOUNTER — Inpatient Hospital Stay: Payer: Medicare HMO

## 2019-01-01 ENCOUNTER — Encounter: Payer: Self-pay | Admitting: Emergency Medicine

## 2019-01-01 ENCOUNTER — Telehealth: Payer: Self-pay | Admitting: Family Medicine

## 2019-01-01 DIAGNOSIS — G609 Hereditary and idiopathic neuropathy, unspecified: Secondary | ICD-10-CM | POA: Diagnosis not present

## 2019-01-01 DIAGNOSIS — I5022 Chronic systolic (congestive) heart failure: Secondary | ICD-10-CM | POA: Diagnosis present

## 2019-01-01 DIAGNOSIS — Z7951 Long term (current) use of inhaled steroids: Secondary | ICD-10-CM

## 2019-01-01 DIAGNOSIS — R509 Fever, unspecified: Secondary | ICD-10-CM | POA: Diagnosis not present

## 2019-01-01 DIAGNOSIS — J189 Pneumonia, unspecified organism: Secondary | ICD-10-CM

## 2019-01-01 DIAGNOSIS — I2510 Atherosclerotic heart disease of native coronary artery without angina pectoris: Secondary | ICD-10-CM | POA: Diagnosis not present

## 2019-01-01 DIAGNOSIS — Z79899 Other long term (current) drug therapy: Secondary | ICD-10-CM

## 2019-01-01 DIAGNOSIS — J690 Pneumonitis due to inhalation of food and vomit: Secondary | ICD-10-CM

## 2019-01-01 DIAGNOSIS — Z86718 Personal history of other venous thrombosis and embolism: Secondary | ICD-10-CM

## 2019-01-01 DIAGNOSIS — R1312 Dysphagia, oropharyngeal phase: Secondary | ICD-10-CM | POA: Diagnosis present

## 2019-01-01 DIAGNOSIS — Z8261 Family history of arthritis: Secondary | ICD-10-CM | POA: Diagnosis not present

## 2019-01-01 DIAGNOSIS — F419 Anxiety disorder, unspecified: Secondary | ICD-10-CM | POA: Diagnosis present

## 2019-01-01 DIAGNOSIS — Z885 Allergy status to narcotic agent status: Secondary | ICD-10-CM

## 2019-01-01 DIAGNOSIS — Y95 Nosocomial condition: Secondary | ICD-10-CM | POA: Diagnosis present

## 2019-01-01 DIAGNOSIS — M1990 Unspecified osteoarthritis, unspecified site: Secondary | ICD-10-CM | POA: Diagnosis not present

## 2019-01-01 DIAGNOSIS — E785 Hyperlipidemia, unspecified: Secondary | ICD-10-CM | POA: Diagnosis not present

## 2019-01-01 DIAGNOSIS — R1310 Dysphagia, unspecified: Secondary | ICD-10-CM | POA: Diagnosis not present

## 2019-01-01 DIAGNOSIS — J432 Centrilobular emphysema: Secondary | ICD-10-CM | POA: Diagnosis not present

## 2019-01-01 DIAGNOSIS — K922 Gastrointestinal hemorrhage, unspecified: Secondary | ICD-10-CM

## 2019-01-01 DIAGNOSIS — A419 Sepsis, unspecified organism: Secondary | ICD-10-CM | POA: Diagnosis present

## 2019-01-01 DIAGNOSIS — Z886 Allergy status to analgesic agent status: Secondary | ICD-10-CM

## 2019-01-01 DIAGNOSIS — Z7902 Long term (current) use of antithrombotics/antiplatelets: Secondary | ICD-10-CM

## 2019-01-01 DIAGNOSIS — J9811 Atelectasis: Secondary | ICD-10-CM | POA: Diagnosis not present

## 2019-01-01 DIAGNOSIS — I739 Peripheral vascular disease, unspecified: Secondary | ICD-10-CM | POA: Diagnosis present

## 2019-01-01 DIAGNOSIS — I10 Essential (primary) hypertension: Secondary | ICD-10-CM | POA: Diagnosis not present

## 2019-01-01 DIAGNOSIS — F17210 Nicotine dependence, cigarettes, uncomplicated: Secondary | ICD-10-CM | POA: Diagnosis not present

## 2019-01-01 DIAGNOSIS — D509 Iron deficiency anemia, unspecified: Secondary | ICD-10-CM | POA: Diagnosis not present

## 2019-01-01 DIAGNOSIS — M4180 Other forms of scoliosis, site unspecified: Secondary | ICD-10-CM | POA: Diagnosis not present

## 2019-01-01 DIAGNOSIS — Z882 Allergy status to sulfonamides status: Secondary | ICD-10-CM

## 2019-01-01 DIAGNOSIS — Z888 Allergy status to other drugs, medicaments and biological substances status: Secondary | ICD-10-CM

## 2019-01-01 DIAGNOSIS — G8929 Other chronic pain: Secondary | ICD-10-CM | POA: Diagnosis not present

## 2019-01-01 DIAGNOSIS — I252 Old myocardial infarction: Secondary | ICD-10-CM

## 2019-01-01 DIAGNOSIS — R000 Tachycardia, unspecified: Secondary | ICD-10-CM | POA: Diagnosis not present

## 2019-01-01 DIAGNOSIS — Z7983 Long term (current) use of bisphosphonates: Secondary | ICD-10-CM | POA: Diagnosis not present

## 2019-01-01 DIAGNOSIS — K219 Gastro-esophageal reflux disease without esophagitis: Secondary | ICD-10-CM | POA: Diagnosis present

## 2019-01-01 DIAGNOSIS — Z961 Presence of intraocular lens: Secondary | ICD-10-CM | POA: Diagnosis not present

## 2019-01-01 DIAGNOSIS — R6510 Systemic inflammatory response syndrome (SIRS) of non-infectious origin without acute organ dysfunction: Secondary | ICD-10-CM | POA: Diagnosis not present

## 2019-01-01 DIAGNOSIS — Z8249 Family history of ischemic heart disease and other diseases of the circulatory system: Secondary | ICD-10-CM

## 2019-01-01 DIAGNOSIS — I110 Hypertensive heart disease with heart failure: Secondary | ICD-10-CM | POA: Diagnosis present

## 2019-01-01 DIAGNOSIS — Z9111 Patient's noncompliance with dietary regimen: Secondary | ICD-10-CM

## 2019-01-01 DIAGNOSIS — J449 Chronic obstructive pulmonary disease, unspecified: Secondary | ICD-10-CM | POA: Diagnosis present

## 2019-01-01 DIAGNOSIS — K920 Hematemesis: Secondary | ICD-10-CM | POA: Diagnosis not present

## 2019-01-01 DIAGNOSIS — Z9841 Cataract extraction status, right eye: Secondary | ICD-10-CM

## 2019-01-01 DIAGNOSIS — Z9842 Cataract extraction status, left eye: Secondary | ICD-10-CM

## 2019-01-01 LAB — LACTIC ACID, PLASMA
Lactic Acid, Venous: 2 mmol/L (ref 0.5–1.9)
Lactic Acid, Venous: 0.8 mmol/L (ref 0.5–1.9)

## 2019-01-01 LAB — URINALYSIS, ROUTINE W REFLEX MICROSCOPIC
Bacteria, UA: NONE SEEN
Bilirubin Urine: NEGATIVE
Glucose, UA: NEGATIVE mg/dL
Ketones, ur: NEGATIVE mg/dL
Leukocytes, UA: NEGATIVE
Nitrite: NEGATIVE
Protein, ur: NEGATIVE mg/dL
Specific Gravity, Urine: 1.018 (ref 1.005–1.030)
pH: 5 (ref 5.0–8.0)

## 2019-01-01 LAB — COMPREHENSIVE METABOLIC PANEL
ALK PHOS: 60 U/L (ref 38–126)
ALT: 10 U/L (ref 0–44)
AST: 18 U/L (ref 15–41)
Albumin: 4.7 g/dL (ref 3.5–5.0)
Anion gap: 11 (ref 5–15)
BUN: 16 mg/dL (ref 8–23)
CALCIUM: 9.2 mg/dL (ref 8.9–10.3)
CO2: 26 mmol/L (ref 22–32)
Chloride: 101 mmol/L (ref 98–111)
Creatinine, Ser: 0.77 mg/dL (ref 0.44–1.00)
GFR calc Af Amer: >60 mL/min (ref >60)
GFR calc non Af Amer: >60 mL/min (ref >60)
Glucose, Bld: 84 mg/dL (ref 70–99)
Potassium: 4.5 mmol/L (ref 3.5–5.1)
SODIUM: 138 mmol/L (ref 135–145)
Total Bilirubin: 0.9 mg/dL (ref 0.3–1.2)
Total Protein: 8 g/dL (ref 6.5–8.1)

## 2019-01-01 LAB — BLOOD GAS, VENOUS
Acid-Base Excess: 3.6 mmol/L — ABNORMAL HIGH (ref 0.0–2.0)
Bicarbonate: 31.7 mmol/L — ABNORMAL HIGH (ref 20.0–28.0)
O2 Saturation: 76 %
Patient temperature: 37
pCO2, Ven: 63 mmHg — ABNORMAL HIGH (ref 44.0–60.0)
pH, Ven: 7.31 (ref 7.250–7.430)
pO2, Ven: 45 mmHg (ref 32.0–45.0)

## 2019-01-01 LAB — CBC WITH DIFFERENTIAL/PLATELET
Abs Immature Granulocytes: 0.04 10*3/uL (ref 0.00–0.07)
Basophils Absolute: 0.1 10*3/uL (ref 0.0–0.1)
Basophils Relative: 1 %
EOS ABS: 0.2 10*3/uL (ref 0.0–0.5)
EOS PCT: 1 %
HCT: 43.3 % (ref 36.0–46.0)
Hemoglobin: 13.1 g/dL (ref 12.0–15.0)
Immature Granulocytes: 0 %
Lymphocytes Relative: 10 %
Lymphs Abs: 1.1 10*3/uL (ref 0.7–4.0)
MCH: 30.5 pg (ref 26.0–34.0)
MCHC: 30.3 g/dL (ref 30.0–36.0)
MCV: 100.7 fL — ABNORMAL HIGH (ref 80.0–100.0)
Monocytes Absolute: 0.8 10*3/uL (ref 0.1–1.0)
Monocytes Relative: 7 %
Neutro Abs: 9.3 10*3/uL — ABNORMAL HIGH (ref 1.7–7.7)
Neutrophils Relative %: 81 %
Platelets: 303 10*3/uL (ref 150–400)
RBC: 4.3 MIL/uL (ref 3.87–5.11)
RDW: 14.9 % (ref 11.5–15.5)
WBC: 11.4 10*3/uL — ABNORMAL HIGH (ref 4.0–10.5)
nRBC: 0 % (ref 0.0–0.2)

## 2019-01-01 LAB — TYPE AND SCREEN
ABO/RH(D): A POS
Antibody Screen: NEGATIVE

## 2019-01-01 LAB — INFLUENZA PANEL BY PCR (TYPE A & B)
Influenza A By PCR: NEGATIVE
Influenza B By PCR: NEGATIVE

## 2019-01-01 LAB — APTT: aPTT: 28 seconds (ref 24–36)

## 2019-01-01 LAB — PROTIME-INR
INR: 0.97
PROTHROMBIN TIME: 12.8 s (ref 11.4–15.2)

## 2019-01-01 LAB — MRSA PCR SCREENING: MRSA BY PCR: NEGATIVE

## 2019-01-01 MED ORDER — CLOPIDOGREL BISULFATE 75 MG PO TABS
75.0000 mg | ORAL_TABLET | Freq: Every day | ORAL | Status: DC
  Administered 2019-01-02: 75 mg via ORAL
  Filled 2019-01-01 (×2): qty 1

## 2019-01-01 MED ORDER — FERROUS SULFATE 325 (65 FE) MG PO TABS
325.0000 mg | ORAL_TABLET | Freq: Two times a day (BID) | ORAL | Status: DC
  Administered 2019-01-02 (×2): 325 mg via ORAL
  Filled 2019-01-01 (×3): qty 1

## 2019-01-01 MED ORDER — VITAMIN B-12 1000 MCG PO TABS
1000.0000 ug | ORAL_TABLET | Freq: Every day | ORAL | Status: DC
  Administered 2019-01-02: 1000 ug via ORAL
  Filled 2019-01-01 (×2): qty 1

## 2019-01-01 MED ORDER — PREGABALIN 75 MG PO CAPS
300.0000 mg | ORAL_CAPSULE | Freq: Two times a day (BID) | ORAL | Status: DC
  Filled 2019-01-01 (×3): qty 4

## 2019-01-01 MED ORDER — SODIUM CHLORIDE 0.9 % IV SOLN
3.0000 g | Freq: Four times a day (QID) | INTRAVENOUS | Status: DC
  Administered 2019-01-01 – 2019-01-03 (×8): 3 g via INTRAVENOUS
  Filled 2019-01-01 (×10): qty 3

## 2019-01-01 MED ORDER — PANTOPRAZOLE SODIUM 40 MG IV SOLR
40.0000 mg | Freq: Once | INTRAVENOUS | Status: AC
  Administered 2019-01-01: 40 mg via INTRAVENOUS
  Filled 2019-01-01: qty 40

## 2019-01-01 MED ORDER — IOHEXOL 300 MG/ML  SOLN
75.0000 mL | Freq: Once | INTRAMUSCULAR | Status: AC | PRN
  Administered 2019-01-01: 75 mL via INTRAVENOUS

## 2019-01-01 MED ORDER — ONDANSETRON HCL 4 MG/2ML IJ SOLN
4.0000 mg | Freq: Once | INTRAMUSCULAR | Status: AC
  Administered 2019-01-01: 4 mg via INTRAVENOUS
  Filled 2019-01-01: qty 2

## 2019-01-01 MED ORDER — SODIUM CHLORIDE 0.9 % IV SOLN
1000.0000 mL | Freq: Once | INTRAVENOUS | Status: AC
  Administered 2019-01-01: 1000 mL via INTRAVENOUS

## 2019-01-01 MED ORDER — FENTANYL 50 MCG/HR TD PT72: 1.0000 | MEDICATED_PATCH | TRANSDERMAL | Status: DC

## 2019-01-01 MED ORDER — ATORVASTATIN CALCIUM 20 MG PO TABS
40.0000 mg | ORAL_TABLET | Freq: Every day | ORAL | Status: DC
  Administered 2019-01-02: 40 mg via ORAL
  Filled 2019-01-01: qty 2

## 2019-01-01 MED ORDER — ORAL CARE MOUTH RINSE: 15.0000 mL | Freq: Two times a day (BID) | OROMUCOSAL | Status: DC

## 2019-01-01 MED ORDER — SODIUM CHLORIDE 0.9 % IV BOLUS
1000.0000 mL | Freq: Once | INTRAVENOUS | Status: AC
  Administered 2019-01-01: 1000 mL via INTRAVENOUS

## 2019-01-01 MED ORDER — IOPAMIDOL (ISOVUE-300) INJECTION 61%: 75.0000 mL | Freq: Once | INTRAVENOUS | Status: DC | PRN

## 2019-01-01 MED ORDER — ALBUTEROL SULFATE (2.5 MG/3ML) 0.083% IN NEBU: 2.5000 mg | INHALATION_SOLUTION | RESPIRATORY_TRACT | Status: DC | PRN

## 2019-01-01 MED ORDER — ACETAMINOPHEN 325 MG PO TABS
650.0000 mg | ORAL_TABLET | Freq: Four times a day (QID) | ORAL | Status: DC | PRN
  Filled 2019-01-01 (×2): qty 2

## 2019-01-01 MED ORDER — DOCUSATE SODIUM 100 MG PO CAPS: 100.0000 mg | ORAL_CAPSULE | Freq: Two times a day (BID) | ORAL | Status: DC | PRN

## 2019-01-01 MED ORDER — SODIUM CHLORIDE 0.9 % IV SOLN
INTRAVENOUS | Status: DC
  Administered 2019-01-01 – 2019-01-02 (×3): via INTRAVENOUS

## 2019-01-01 MED ORDER — PANTOPRAZOLE SODIUM 40 MG PO TBEC
40.0000 mg | DELAYED_RELEASE_TABLET | Freq: Two times a day (BID) | ORAL | Status: DC
  Administered 2019-01-01 – 2019-01-02 (×2): 40 mg via ORAL
  Filled 2019-01-01 (×2): qty 1

## 2019-01-01 MED ORDER — SODIUM CHLORIDE 0.9 % IV SOLN
1.0000 g | Freq: Once | INTRAVENOUS | Status: AC
  Administered 2019-01-01: 1 g via INTRAVENOUS
  Filled 2019-01-01: qty 1

## 2019-01-01 MED ORDER — BENZONATATE 100 MG PO CAPS: 100.0000 mg | ORAL_CAPSULE | Freq: Three times a day (TID) | ORAL | Status: DC | PRN

## 2019-01-01 MED ORDER — TIOTROPIUM BROMIDE MONOHYDRATE 18 MCG IN CAPS: 18.0000 ug | ORAL_CAPSULE | Freq: Every day | RESPIRATORY_TRACT | Status: DC

## 2019-01-01 MED ORDER — MEMANTINE HCL 5 MG PO TABS
5.0000 mg | ORAL_TABLET | Freq: Two times a day (BID) | ORAL | Status: DC
  Administered 2019-01-01 – 2019-01-02 (×2): 5 mg via ORAL
  Filled 2019-01-01 (×4): qty 1

## 2019-01-01 MED ORDER — ALPRAZOLAM 0.25 MG PO TABS: 0.2500 mg | ORAL_TABLET | Freq: Every evening | ORAL | Status: DC | PRN

## 2019-01-01 MED ORDER — VANCOMYCIN HCL IN DEXTROSE 1-5 GM/200ML-% IV SOLN
1000.0000 mg | Freq: Once | INTRAVENOUS | Status: AC
  Administered 2019-01-01: 1000 mg via INTRAVENOUS
  Filled 2019-01-01: qty 200

## 2019-01-01 MED ORDER — HEPARIN SODIUM (PORCINE) 5000 UNIT/ML IJ SOLN: 5000.0000 [IU] | Freq: Three times a day (TID) | INTRAMUSCULAR | Status: DC

## 2019-01-01 MED ORDER — IPRATROPIUM-ALBUTEROL 0.5-2.5 (3) MG/3ML IN SOLN
3.0000 mL | RESPIRATORY_TRACT | Status: DC
  Administered 2019-01-01 – 2019-01-02 (×4): 3 mL via RESPIRATORY_TRACT
  Filled 2019-01-01 (×4): qty 3

## 2019-01-01 NOTE — ED Notes (Signed)
Dr Cyril Loosen notified of pt decrease in BP over the last hour - VO for NACL bolus 1000cc at this time

## 2019-01-01 NOTE — ED Notes (Signed)
ED TO INPATIENT HANDOFF REPORT  Name/Age/Gender Dimas Millin 68 y.o. female  Code Status Code Status History    Date Active Date Inactive Code Status Order ID Comments User Context   12/21/2018 1827 12/22/2018 1644 Full Code [id number]  Enedina Finner, MD ED   11/03/2018 0756 11/08/2018 1557 Full Code [id number]  Delfino Lovett, MD ED   06/18/2018 1428 06/22/2018 1454 Full Code [id number]  Alford Highland, MD ED   06/01/2018 1535 06/05/2018 2007 Full Code [id number]  Altamese Dilling, MD Inpatient   05/14/2018 1351 05/21/2018 1558 Partial Code [id number]  Ihor Austin, MD ED   02/08/2018 1835 02/09/2018 1920 Full Code [id number]  Altamese Dilling, MD Inpatient   02/05/2018 2025 02/07/2018 2119 Full Code [id number]  Milagros Loll, MD ED   04/29/2017 1314 05/02/2017 2148 Full Code [id number]  Katha Hamming, MD ED   12/23/2016 1745 12/25/2016 1849 Full Code [id number]  Katharina Caper, MD ED   11/06/2016 0900 11/07/2016 1853 Full Code [id number]  Delfino Lovett, MD Inpatient   11/05/2016 1401 11/06/2016 0900 Partial Code [id number]  Adrian Saran, MD Inpatient   06/03/2016 0253 06/05/2016 1945 Full Code [id number]  Gery Pray, MD Inpatient   11/19/2015 1218 11/24/2015 1624 Full Code [id number]  Shaune Pollack, MD Inpatient   11/09/2015 2302 11/13/2015 1851 Full Code [id number]  Wyatt Haste, MD ED   10/17/2015 2226 10/22/2015 1303 Full Code [id number]  Enedina Finner, MD Inpatient   10/09/2015 1738 10/12/2015 1934 Full Code [id number]  Shaune Pollack, MD Inpatient   08/01/2015 0008 08/03/2015 1454 Full Code [id number]  Oralia Manis, MD Inpatient      Home/SNF/Other Home  Chief Complaint Coffee ground emesis  Level of Care/Admitting Diagnosis ED Disposition    ED Disposition Condition Comment   Admit  Hospital Area: [facility] [[id number]]  Level of Care: Med-Surg [16]  Diagnosis: Sepsis [facility]) [[id number]]  Admitting Physician: Altamese Dilling [phone]  Attending Physician: Altamese Dilling [phone]  Estimated length of stay: past midnight tomorrow  Certification:: I certify this patient will need inpatient services for at least 2 midnights  PT Class (Do Not Modify): Inpatient [101]  PT Acc Code (Do Not Modify): Private [1]       Medical History Past Medical History:  Diagnosis Date  . Acute postoperative pain 04/14/2017  . Allergy   . Anemia   . Anxiety   . Arthritis    fingers  . Aspiration pneumonitis (HCC) 11/24/2015  . Asthma   . CHF (congestive heart failure) (HCC)   . Chronic pain   . COPD (chronic obstructive pulmonary disease) (HCC)   . Coronary artery disease   . DVT (deep venous thrombosis) (HCC)   . Dyspnea   . Dysrhythmia   . Encephalopathy    MULTIPLE TIMES  . GERD (gastroesophageal reflux disease)   . Headache   . Hyperlipidemia   . Hypertension   . Kyphoscoliosis deformity of spine   . Migraines   . Neuropathy 2010  . Osteoporosis   . Oxygen deficiency   . Peripheral vascular disease (HCC)   . Pneumonia   . Pneumonia 11/19/2015  . Pneumonia 10/2016  . Pneumonia    aspiration 2018- 4 times in last year  . Rib fracture 09/19/2017   Displace right 9th rib fracture.   . Vitamin D deficiency   . Wears dentures    full upper and lower    Allergies Allergies  Allergen Reactions  . Percocet [Oxycodone-Acetaminophen] Hives and Rash  . Aspirin Nausea And  Vomiting and Other (See Comments)  . Codeine Nausea And Vomiting, Nausea Only and Other (See Comments)  . Propoxyphene Nausea Only and Other (See Comments)  . Sulfa Antibiotics Rash and Other (See Comments)    IV Location/Drains/Wounds Patient Lines/Drains/Airways Status   Active Line/Drains/Airways    Name:   Placement date:   Placement time:   Site:   Days:   Peripheral IV 01/01/19 Right Hand   01/01/19    1122    Hand   less than 1   Peripheral IV 01/01/19 Left Hand   01/01/19    1123    Hand   less than 1   External Urinary Catheter   01/01/19    1402    -   less  than 1          Labs/Imaging Results for orders placed or performed during the hospital encounter of 01/01/19 (from the past 48 hour(s))  Lactic acid, plasma     Status: Abnormal   Collection Time: 01/01/19 10:16 AM  Result Value Ref Range   Lactic Acid, Venous 2.0 (HH) 0.5 - 1.9 mmol/L    Comment: CRITICAL RESULT CALLED TO, READ BACK BY AND VERIFIED WITH Berman Grainger AT 1107 ON 01/01/2019 SMA/PMF Performed at [facility], [address] [place], [place] [address]   Comprehensive metabolic panel     Status: None   Collection Time: 01/01/19 10:27 AM  Result Value Ref Range   Sodium 138 135 - 145 mmol/L   Potassium 4.5 3.5 - 5.1 mmol/L   Chloride 101 98 - 111 mmol/L   CO2 26 22 - 32 mmol/L   Glucose, Bld 84 70 - 99 mg/dL   BUN 16 8 - 23 mg/dL   Creatinine, Ser 1.59 0.44 - 1.00 mg/dL   Calcium 9.2 8.9 - 45.8 mg/dL   Total Protein 8.0 6.5 - 8.1 g/dL   Albumin 4.7 3.5 - 5.0 g/dL   AST 18 15 - 41 U/L   ALT 10 0 - 44 U/L   Alkaline Phosphatase 60 38 - 126 U/L   Total Bilirubin 0.9 0.3 - 1.2 mg/dL   GFR calc non Af Amer >60 >60 mL/min   GFR calc Af Amer >60 >60 mL/min   Anion gap 11 5 - 15    Comment: Performed at [facility], [address] [place], [place] [address]  CBC WITH DIFFERENTIAL     Status: Abnormal   Collection Time: 01/01/19 10:27 AM  Result Value Ref Range   WBC 11.4 (H) 4.0 - 10.5 K/uL   RBC 4.30 3.87 - 5.11 MIL/uL   Hemoglobin 13.1 12.0 - 15.0 g/dL   HCT 44.6 28.6 - 38.1 %   MCV 100.7 (H) 80.0 - 100.0 fL   MCH 30.5 26.0 - 34.0 pg   MCHC 30.3 30.0 - 36.0 g/dL   RDW 77.1 16.5 - 79.0 %   Platelets 303 150 - 400 K/uL   nRBC 0.0 0.0 - 0.2 %   Neutrophils Relative % 81 %   Neutro Abs 9.3 (H) 1.7 - 7.7 K/uL   Lymphocytes Relative 10 %   Lymphs Abs 1.1 0.7 - 4.0 K/uL   Monocytes Relative 7 %   Monocytes Absolute 0.8 0.1 - 1.0 K/uL   Eosinophils Relative 1 %   Eosinophils Absolute 0.2 0.0 - 0.5 K/uL   Basophils Relative 1 %   Basophils  Absolute 0.1 0.0 - 0.1 K/uL   Immature Granulocytes 0 %   Abs  Immature Granulocytes 0.04 0.00 - 0.07 K/uL    Comment: Performed at [facility], [address] [place], [place] [address]  Urinalysis, Routine w reflex microscopic     Status: Abnormal   Collection Time: 01/01/19 10:29 AM  Result Value Ref Range   Color, Urine YELLOW (A) YELLOW   APPearance CLEAR (A) CLEAR   Specific Gravity, Urine 1.018 1.005 - 1.030   pH 5.0 5.0 - 8.0   Glucose, UA NEGATIVE NEGATIVE mg/dL   Hgb urine dipstick SMALL (A) NEGATIVE   Bilirubin Urine NEGATIVE NEGATIVE   Ketones, ur NEGATIVE NEGATIVE mg/dL   Protein, ur NEGATIVE NEGATIVE mg/dL   Nitrite NEGATIVE NEGATIVE   Leukocytes, UA NEGATIVE NEGATIVE   RBC / HPF 0-5 0 - 5 RBC/hpf   WBC, UA 0-5 0 - 5 WBC/hpf   Bacteria, UA NONE SEEN NONE SEEN   Squamous Epithelial / LPF 0-5 0 - 5   Mucus PRESENT    Hyaline Casts, UA PRESENT     Comment: Performed at [facility], [address] [place], [place] [address]  Influenza panel by PCR (type A & B)     Status: None   Collection Time: 01/01/19 10:29 AM  Result Value Ref Range   Influenza A By PCR NEGATIVE NEGATIVE   Influenza B By PCR NEGATIVE NEGATIVE    Comment: (NOTE) The Xpert Xpress Flu assay is intended as an aid in the diagnosis of  influenza and should not be used as a sole basis for treatment.  This  assay is FDA approved for nasopharyngeal swab specimens only. Nasal  washings and aspirates are unacceptable for Xpert Xpress Flu testing. Performed at [facility], [address] [place], [place] [address]   Type and screen [facility]     Status: None   Collection Time: 01/01/19 10:29 AM  Result Value Ref Range   ABO/RH(D) A POS    Antibody Screen NEG    Sample Expiration      01/04/2019 Performed at [facility], [address] [place], [place] [address]   Blood gas, venous     Status: Abnormal   Collection Time: 01/01/19  10:44 AM  Result Value Ref Range   pH, Ven 7.31 7.250 - 7.430   pCO2, Ven 63 (H) 44.0 - 60.0 mmHg   pO2, Ven 45.0 32.0 - 45.0 mmHg   Bicarbonate 31.7 (H) 20.0 - 28.0 mmol/L   Acid-Base Excess 3.6 (H) 0.0 - 2.0 mmol/L   O2 Saturation 76.0 %   Patient temperature 37.0    Collection site VEIN    Sample type VEIN     Comment: Performed at [name], [address], [place], [place] [address]  Protime-INR     Status: None   Collection Time: 01/01/19 10:50 AM  Result Value Ref Range   Prothrombin Time 12.8 11.4 - 15.2 seconds   INR 0.97     Comment: Performed at [facility], [address] [place], [place] [address]  APTT     Status: None   Collection Time: 01/01/19 10:50 AM  Result Value Ref Range   aPTT 28 24 - 36 seconds    Comment: Performed at [facility], [address] [place], [place] [address]   Ct Chest W Contrast  Result Date: 01/01/2019 CLINICAL DATA:  Unresolved pneumonia. Worsening shortness of breath. EXAM: CT CHEST WITH CONTRAST TECHNIQUE: Multidetector CT imaging of the chest was performed during intravenous contrast administration.  CONTRAST:  75mL OMNIPAQUE IOHEXOL 300 MG/ML  SOLN COMPARISON:  06/01/2018 FINDINGS: Cardiovascular: Normal heart size. No pericardial effusion. Aortic atherosclerosis. Calcification in the LAD and RCA coronary arteries noted. Mediastinum/Nodes: Normal appearance of the thyroid gland. The trachea appears patent and is midline. Moderate hiatal hernia. Multiple non pathologically enlarged right paratracheal, prevascular lymph nodes are identified. Subcarinal lymph node is enlarged measuring 1.6 cm. Unchanged from previous exam. No left hilar adenopathy. Right hilar lymph node measures prominent right hilar lymph nodes are identified. Index node measures 0.9 cm, image 68/2 index right hilar lymph node measures 1 cm, image 63/2. Previously 0.9 cm. Lungs/Pleura: No pleural effusion identified. Progressive airspace  consolidation and atelectasis within both lower lobes identified right greater than left. Mild changes of paraseptal emphysema. Subsegmental atelectasis and volume loss within the lingula and right middle lobe identified. New subpleural nodular density within the right upper lobe measures 7 mm, image 47/3. Are Upper Abdomen: There is intrahepatic bile duct dilatation. The common bile duct is not visualized in its entirety. Proximally this measures 1.2 cm. Compared with CT of the abdomen from 08/04/2017 this is slightly progressive. Etiology indeterminate. Musculoskeletal: Right lateral eighth and ninth rib fractures are identified and appear unchanged from previous exam. Marked degenerative disc disease noted within the lower cervical spine. A IMPRESSION: 1. A there is progressive worsening aeration to both lung bases with increasing areas of atelectasis and airspace consolidation especially in the right lower lobe. Subsegmental atelectasis and volume loss is also noted within the lingula and right middle lobe which appears slightly progressive from previous exam. Imaging findings are compatible with either recurrent and progressive pneumonia and/or aspiration. Underlying endobronchial lesion can not be excluded. 2. Persistent enlarged subcarinal lymph nodes. In the setting of recurrent pneumonia these may be reactive etiology. If there is a central obstructing lesion thin this could represent metastatic adenopathy. Consider further evaluation with bronchoscopy. 3. New, nonspecific subpleural nodule within the posterior right upper lobe measuring 7 mm. Non-contrast chest CT at 6-12 months is recommended. If the nodule is stable at time of repeat CT, then future CT at 18-24 months (from today's scan) is considered optional for low-risk patients, but is recommended for high-risk patients. This recommendation follows the consensus statement: Guidelines for Management of Incidental Pulmonary Nodules Detected on CT  Images: From the Fleischner Society 2017; Radiology 2017; 284:228-243. 4. Increase intrahepatic bile duct dilatation and mild increase caliber of the common bile duct. The common bile duct is not visualized and its entirety. This is slightly progressive when compared with CT of the abdomen from 08/04/2017. 5. Mild paraseptal emphysema with diffuse bronchial wall thickening. 6. Aortic atherosclerosis and coronary artery atherosclerotic calcifications. Electronically Signed   By: Signa Kellaylor  Stroud M.D.   On: 01/01/2019 15:25   Dg Chest Port 1 View  Result Date: 01/01/2019 CLINICAL DATA:  Blood infection, GI bleed. EXAM: PORTABLE CHEST 1 VIEW COMPARISON:  12/30/2018 FINDINGS: Cardiac enlargement. Negative for pulmonary edema. Mild patchy bibasilar airspace disease has progressed since the prior study. No significant effusion. IMPRESSION: Progression of mild bibasilar atelectasis/pneumonia. Electronically Signed   By: Marlan Palauharles  Clark M.D.   On: 01/01/2019 11:01    Pending Labs Unresulted Labs (From admission, onward)    Start     Ordered   01/01/19 2000  Hemoglobin  Once,   STAT    Comments:  Call MD, If < 7, Or drop  More than 1 gram/dl from previous report.    01/01/19 1522   01/01/19 1209  MRSA PCR Screening  Once,   STAT     01/01/19 1208   01/01/19 1016  Lactic acid, plasma  STAT Now then every 3 hours,   STAT     01/01/19 1015   01/01/19 1016  Blood Culture (routine x 2)  BLOOD CULTURE X 2,   STAT     01/01/19 1015   01/01/19 1016  Urine culture  ONCE - STAT,   STAT     01/01/19 1015   Signed and Held  Basic metabolic panel  Tomorrow morning,   R     Signed and Held   Signed and Held  CBC  Tomorrow morning,   R     Signed and Held   Signed and Held  CBC  (heparin)  Once,   R    Comments:  Baseline for heparin therapy IF NOT ALREADY DRAWN.  Notify MD if PLT < 100 K.    Signed and Held   Signed and Held  Creatinine, serum  (heparin)  Once,   R    Comments:  Baseline for heparin therapy IF  NOT ALREADY DRAWN.    Signed and Held          Vitals/Pain Today's Vitals   01/01/19 1345 01/01/19 1630 01/01/19 1645 01/01/19 1700  BP: (!) 96/47 116/64 136/61 (!) 142/72  Pulse:  86 86 83  Resp: (!) 8 (!) 9 12 13   Temp:      TempSrc:      SpO2:  100% 100% 99%  Weight:      PainSc:        Isolation Precautions No active isolations  Medications Medications  ipratropium-albuterol (DUONEB) 0.5-2.5 (3) MG/3ML nebulizer solution 3 mL (has no administration in time range)  0.9 %  sodium chloride infusion (has no administration in time range)  iopamidol (ISOVUE-300) 61 % injection 75 mL (has no administration in time range)  Ampicillin-Sulbactam (UNASYN) 3 g in sodium chloride 0.9 % 100 mL IVPB (has no administration in time range)  0.9 %  sodium chloride infusion (0 mLs Intravenous Stopped 01/01/19 1434)  ceFEPIme (MAXIPIME) 1 g in sodium chloride 0.9 % 100 mL IVPB (0 g Intravenous Stopped 01/01/19 1155)  pantoprazole (PROTONIX) injection 40 mg (40 mg Intravenous Given 01/01/19 1155)  ondansetron (ZOFRAN) injection 4 mg (4 mg Intravenous Given 01/01/19 1155)  vancomycin (VANCOCIN) IVPB 1000 mg/200 mL premix (0 mg Intravenous Stopped 01/01/19 1356)  sodium chloride 0.9 % bolus 1,000 mL (0 mLs Intravenous Stopped 01/01/19 1705)  iohexol (OMNIPAQUE) 300 MG/ML solution 75 mL (75 mLs Intravenous Contrast Given 01/01/19 1441)    Mobility walks

## 2019-01-01 NOTE — Telephone Encounter (Signed)
[name] from home health called to report Cheryl [name] was coughing up "black stuff" and running a fever of 101.4. [name] states she had left the pt's home but gave me the contact number for pt's sister who was still with her.  I spoke with her sister and advised her that Ms. Como will need to go to the ER.  She agreed and will be taking her.   Thanks,   -Vernona Rieger

## 2019-01-01 NOTE — ED Notes (Signed)
Report given to [facility] - they will send someone to get pt

## 2019-01-01 NOTE — ED Notes (Signed)
Staff arrived to take pt to floor

## 2019-01-01 NOTE — Progress Notes (Signed)
Pharmacy Antibiotic Note  Cheryl [name] is a 68 y.o. female admitted on 01/01/2019 with aspiration pneumonia.  Pharmacy has been consulted for Unasyn dosing.  Plan: Patient received Cefepime 1 gram IV x 1 in ER and Vancomycin 1 gram IV x 1 in ER. Will start Unasyn 3 gram IV q6h   Weight: 96 lb (43.5 kg)  Temp (24hrs), Avg:102.9 F (39.4 C), Min:102.9 F (39.4 C), Max:102.9 F (39.4 C)  Recent Labs  Lab 01/01/19 1016 01/01/19 1027  WBC  --  11.4*  CREATININE  --  0.77  LATICACIDVEN 2.0*  --     Estimated Creatinine Clearance: 42.9 mL/min (by C-G formula based on SCr of 0.77 mg/dL).    Allergies  Allergen Reactions  . Percocet [Oxycodone-Acetaminophen] Hives and Rash  . Aspirin Nausea And Vomiting and Other (See Comments)  . Codeine Nausea And Vomiting, Nausea Only and Other (See Comments)  . Propoxyphene Nausea Only and Other (See Comments)  . Sulfa Antibiotics Rash and Other (See Comments)    Antimicrobials this admission: Unasyn 2/7 >>   Cefepime 1 gram x 1 and Vanc 1 gram x 1 in ER 2/7   Dose adjustments this admission:    Microbiology results: 2/7 BCx: pend 2/7 UCx: pend    Sputum:    2/7 MRSA PCR: pend  Thank you for allowing pharmacy to be a part of this patient's care.  Teana Lindahl A 01/01/2019 3:04 PM

## 2019-01-01 NOTE — Care Management Note (Signed)
Case Management Note  Patient Details  Name: Cheryl [name] MRN: [id number] Date of Birth: 03/12/1951  Subjective/Objective:   Patient admitted to [name] with hypotension and increasing weakness. Patient currently lives at home with her spouse Cheryl [name] [phone]. Patient uses a walker, bedside commode and and oxygen in the home. Patient is active with [facility] for services. Notified [facility] of pending admission. Per spouse they would prefer to resume care at discharge. PCP is Cheryl [name]. Uses [facility] and obtains medications without issue.                  Action/Plan: RNCM to continue to follow for any needs.   Expected Discharge Date:                  Expected Discharge Plan:     In-House Referral:     Discharge planning Services     Post Acute Care Choice:    Choice offered to:     DME Arranged:    DME Agency:     HH Arranged:    HH Agency:     Status of Service:     If discussed at [employer], dates discussed:    Additional Comments:  Virgel Manifold, RN 01/01/2019, 2:39 PM

## 2019-01-01 NOTE — ED Provider Notes (Signed)
[facility] Emergency Department Provider Note   ____________________________________________    I have reviewed the triage vital signs and the nursing notes.   HISTORY  Chief Complaint Blood Infection and GI Bleeding  History primarily from son   HPI Cheryl [name] is a 68 y.o. female who presents with cough shortness of breath, vomiting "black stuff ".  Son reports patient was in the hospital last week for pneumonia, sent home with Augmentin which she has apparently completed that course, had been doing better for a while but then seemed to be worsening.  Over the last 2 days has complained of nausea, initially vomiting "yellow stuff "but then last night vomitus was darker in color.  No reports of fevers at home.  Past Medical History:  Diagnosis Date  . Acute postoperative pain 04/14/2017  . Allergy   . Anemia   . Anxiety   . Arthritis    fingers  . Aspiration pneumonitis ([place]) 11/24/2015  . Asthma   . CHF (congestive heart failure) ([place])   . Chronic pain   . COPD (chronic obstructive pulmonary disease) ([place])   . Coronary artery disease   . DVT (deep venous thrombosis) ([place])   . Dyspnea   . Dysrhythmia   . Encephalopathy    MULTIPLE TIMES  . GERD (gastroesophageal reflux disease)   . Headache   . Hyperlipidemia   . Hypertension   . Kyphoscoliosis deformity of spine   . Migraines   . Neuropathy 2010  . Osteoporosis   . Oxygen deficiency   . Peripheral vascular disease ([place])   . Pneumonia   . Pneumonia 11/19/2015  . Pneumonia 10/2016  . Pneumonia    aspiration 2018- 4 times in last year  . Rib fracture 09/19/2017   Displace right 9th rib fracture.   . Vitamin D deficiency   . Wears dentures    full upper and lower    Patient Active Problem List   Diagnosis Date Noted  . Aspiration pneumonia ([place]) 01/01/2019  . Hiatal hernia 12/24/2018  . Sepsis ([place]) 11/03/2018  . Fever   . Respiratory distress   . NSTEMI (non-ST elevated  myocardial infarction) ([place])   . Chronic lower extremity pain (Primary Area of Pain) (Bilateral) (L>R) 09/03/2018  . Chronic hip pain (Fourth Area of Pain) (Bilateral) (L>R) 09/03/2018  . Osteoarthritis of hip (Bilateral) 09/03/2018  . Chronic sacroiliac joint pain [facility] Area of Pain) (Bilateral) (L>R) 09/03/2018  . Sacroiliac joint dysfunction (Bilateral) 09/03/2018  . Spondylosis without myelopathy or radiculopathy, lumbosacral region 09/03/2018  . Other specified dorsopathies, sacral and sacrococcygeal region 09/03/2018  . Disorder of skeletal system 09/02/2018  . Problems influencing health status 09/02/2018  . DDD (degenerative disc disease), lumbar 09/02/2018  . Abnormal CT scan, lumbar spine (08/04/2017) 09/02/2018  . Levoscoliosis (Severe) 09/02/2018  . 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 Lumbar Grade 1 Anterolisthesis of L3 over L4 and L4 over L5 09/02/2018  . Lumbar foraminal stenosis (Bilateral: L3-4, L4-5) (Severe Right L4-5) 09/02/2018  . Lumbar facet arthropathy (Bilateral) 09/02/2018  . Lumbar facet osteoarthritis (Bilateral) 09/02/2018  . Lumbar spondylosis 09/02/2018  . Cervical spondylosis 09/02/2018  . Thoracic spondylosis 09/02/2018  . DDD (degenerative disc disease), thoracic 09/02/2018  . DDD (degenerative disc disease), cervical 09/02/2018  . Pharmacologic therapy 09/01/2018  . Spondylosis without myelopathy or radiculopathy, lumbar region 09/01/2018  . Anorexia 07/07/2018  . 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 Goals of  care, counseling/discussion   . Palliative care by specialist   . Acute encephalopathy 06/18/2018  . Iron deficiency anemia 06/09/2018  . Gastritis without bleeding   . Hypoxia 05/14/2018  . Generalized weakness 02/08/2018  . Marijuana use 06/05/2017  . Pneumonia 04/29/2017  . Metabolic encephalopathy 16/08/9603  . Hypokalemia 12/23/2016  . Protein-calorie malnutrition, severe  11/06/2016  . Nausea with vomiting   . Lumbar facet syndrome (Bilateral) (L>R) 03/20/2016  . Opiate use (160 MME/Day) 02/28/2016  . Anemia 02/28/2016  . Osteoarthrosis 12/18/2015  . Long term current use of anticoagulant therapy (Plavix) 12/18/2015  . Long term current use of opiate analgesic 12/04/2015  . Fibromyalgia 12/04/2015  . Dysphagia 11/24/2015  . Acute diastolic CHF (congestive heart failure) ([place]) 11/24/2015  . Leukocytosis 11/24/2015  . Abnormal mammogram of right breast 10/11/2015  . Dilated intrahepatic bile duct 10/11/2015  . Chronic lumbar radicular pain (Bilateral) (L>R) (L5) 09/04/2015  . Chronic low back pain (Secondary Area of Pain) (Bilateral) (L>R) 09/04/2015  . Encounter for therapeutic drug level monitoring 09/04/2015  . Uncomplicated opioid dependence ([place]) 09/04/2015  . Chronic pain syndrome 09/04/2015  . Platelet inhibition due to Plavix 09/04/2015  . COPD with acute exacerbation ([place]) 07/31/2015  . Dementia ([place]) 07/31/2015  . Airway hyperreactivity 07/03/2015  . Chronic thoracic back pain 07/03/2015  . Excessive falling 07/03/2015  . Alteration in bowel elimination: incontinence 07/03/2015  . Insomnia 07/03/2015  . Decreased testosterone level 07/03/2015  . Leg weakness 07/03/2015  . Menopausal symptom 07/03/2015  . Migraine 07/03/2015  . Neuropathy ([place]) 07/03/2015  . Fecal occult blood test positive 07/03/2015  . OP (osteoporosis) 07/03/2015  . Panic disorder 07/03/2015  . Shortness of breath 07/03/2015  . Compulsive tobacco user syndrome 07/03/2015  . Urinary incontinence 07/03/2015  . Weight loss 07/03/2015  . Essential hypertension 06/06/2015  . GERD (gastroesophageal reflux disease) 06/06/2015  . Hyperlipemia 06/06/2015  . Peripheral nerve disease 04/13/2014  . Anxiety 02/10/2014  . Coronary artery disease 02/10/2014  . Peripheral vascular disease ([place]) 02/10/2014  . Vitamin D deficiency 10/16/2009    Past Surgical History:    Procedure Laterality Date  . ABDOMINAL HYSTERECTOMY  1975   Bilaterl Oophorectomy; [place] to IUD infection  . abdomnal aortic stent  05/30/2008   Dr. Quay Burow  . APPENDECTOMY    . APPENDECTOMY    . BACK SURGERY    . cardiac catherization  10/31/2009  . CARDIAC CATHETERIZATION    . CATARACT EXTRACTION W/PHACO Left 08/20/2018   Procedure: CATARACT EXTRACTION PHACO AND INTRAOCULAR LENS PLACEMENT (IOC);  Surgeon: Eulogio Bear, MD;  Location: [facility] ORS;  Service: Ophthalmology;  Laterality: Left;  [place] 00:45.1 AP% 12.3 CDE 5.54 FLUID PACK LOT # [id number]  . CATARACT EXTRACTION W/PHACO Right 09/23/2018   Procedure: CATARACT EXTRACTION PHACO AND INTRAOCULAR LENS PLACEMENT ([place]);  Surgeon: Eulogio Bear, MD;  Location: [facility] ORS;  Service: Ophthalmology;  Laterality: Right;  [place] 00:45.0 CDE 5.19 Fluid Pack lot # [id number]  . CERVICAL FUSION  C5 - 6/C6-7  . CHOLECYSTECTOMY  1972  . COLONOSCOPY WITH PROPOFOL N/A 07/27/2015   Procedure: COLONOSCOPY WITH PROPOFOL;  Surgeon: Hulen Luster, MD;  Location: [facility] ENDOSCOPY;  Service: Gastroenterology;  Laterality: N/A;  . ESOPHAGOGASTRODUODENOSCOPY (EGD) WITH PROPOFOL N/A 07/27/2015   Procedure: ESOPHAGOGASTRODUODENOSCOPY (EGD) WITH PROPOFOL;  Surgeon: Hulen Luster, MD;  Location[facility] ENDOSCOPY;  Service: Gastroenterology;  Laterality: N/A;  . ESOPHAGOGASTRODUODENOSCOPY (EGD) WITH PROPOFOL N/A 05/19/2018   Procedure: ESOPHAGOGASTRODUODENOSCOPY (EGD) WITH PROPOFOL;  Surgeon:  Lucilla Lame, MD;  Location: [facility] ENDOSCOPY;  Service: Endoscopy;  Laterality: N/A;  . EYE SURGERY    . FOOT SURGERY Bilateral    5-6 years per patient  . SPINE SURGERY    . TONSILLECTOMY    . VASCULAR SURGERY     LEG STENTS    Prior to Admission medications   Medication Sig Start Date End Date Taking? Authorizing Provider  albuterol (PROVENTIL) (2.5 MG/3ML) 0.083% nebulizer solution Take 3 mLs (2.5 mg total) by nebulization every 4 (four) hours as needed for wheezing. 12/24/17   Yes Birdie Sons, MD  alendronate (FOSAMAX) 70 MG tablet Take 1 tablet (70 mg total) by mouth once a week. Take with a full glass of water on an empty stomach. 12/04/18  Yes Birdie Sons, MD  ALPRAZolam Duanne Moron) 0.5 MG tablet Take 0.5-1 tablets (0.25-0.5 mg total) by mouth at bedtime as needed for anxiety. 08/19/18  Yes Birdie Sons, MD  amLODipine (NORVASC) 5 MG tablet Take 1 tablet (5 mg total) by mouth daily. 12/09/18  Yes Birdie Sons, MD  atorvastatin (LIPITOR) 40 MG tablet Take 1 tablet (40 mg total) by mouth daily at 6 PM. 11/08/18  Yes Demetrios Loll, MD  baclofen (LIORESAL) 10 MG tablet Take 1 tablet (10 mg total) by mouth 3 (three) times daily as needed for muscle spasms. 08/17/18  Yes Triplett, Cari B, FNP  BELSOMRA 5 MG TABS Take 1 tablet by mouth at bedtime as needed (for insomnia). Patient taking differently: Take 5 mg by mouth at bedtime as needed (insomnia).  11/16/18  Yes Birdie Sons, MD  celecoxib (CELEBREX) 100 MG capsule Take 100 mg by mouth 2 (two) times daily as needed. 11/23/18  Yes [provider]  clopidogrel (PLAVIX) 75 MG tablet Take 75 mg by mouth daily.   Yes [provider]  estradiol (ESTRACE) 0.5 MG tablet Take 1 tablet (0.5 mg total) by mouth every other day. 09/09/18  Yes Birdie Sons, MD  famotidine (PEPCID) 20 MG tablet Take 1 tablet (20 mg total) by mouth daily. 12/09/18  Yes Birdie Sons, MD  ferrous sulfate 325 (65 FE) MG tablet Take 1 tablet (325 mg total) by mouth 2 (two) times daily with a meal. 09/23/18  Yes Birdie Sons, MD  Fluticasone-Salmeterol (ADVAIR DISKUS) 250-50 MCG/DOSE AEPB TAKE ONE PUFF TWICE DAILY. 09/28/18  Yes Birdie Sons, MD  furosemide (LASIX) 20 MG tablet Take 1 tablet (20 mg total) by mouth every other day. 11/08/18  Yes Demetrios Loll, MD  memantine (NAMENDA) 5 MG tablet Take 5 mg by mouth 2 (two) times daily.   Yes [provider]  pantoprazole (PROTONIX) 40 MG tablet Take 40 mg by mouth  2 (two) times daily. Take 1 tablet (40 mg) by mouth scheduled every morning, may repeat dose in evening if needed for heartburn/indigestion.   Yes [provider]  pregabalin (LYRICA) 300 MG capsule Take 300 mg by mouth 2 (two) times daily.   Yes [provider]  silver sulfADIAZINE (SILVADENE) 1 % cream Apply 1 application topically daily as needed (leg infection).    Yes [provider]  tiotropium (SPIRIVA HANDIHALER) 18 MCG inhalation capsule inhale the contents of one capsule in the handihaler once daily Patient taking differently: Place 18 mcg into inhaler and inhale daily. inhale the contents of one capsule in the handihaler once daily 01/23/18  Yes Fisher, Kirstie Peri, MD  triamcinolone (KENALOG) 0.025 % ointment Apply 1 application topically  2 (two) times daily. Patient taking differently: Apply 1 application topically 2 (two) times daily as needed (cracking skin).  06/15/18  Yes Birdie Sons, MD  VENTOLIN HFA 108 [phone] Base) MCG/ACT inhaler Inhale 2 puffs into the lungs every 4 (four) hours as needed for wheezing or shortness of breath. Patient taking differently: Inhale 2 puffs into the lungs every 4 (four) hours as needed for wheezing or shortness of breath.  08/21/18  Yes Birdie Sons, MD  vitamin B-12 (CYANOCOBALAMIN) 1000 MCG tablet Take 1 tablet (1,000 mcg total) by mouth daily. 05/21/18  Yes Wieting, Richard, MD  acetaminophen (TYLENOL) 325 MG tablet Take 2 tablets (650 mg total) by mouth every 6 (six) hours as needed for mild pain (or Fever >/= 101). 05/21/18   Loletha Grayer, MD  amoxicillin-clavulanate (AUGMENTIN) 875-125 MG tablet Take 1 tablet by mouth 2 (two) times daily for 7 days. Patient not taking: Reported on 01/01/2019 12/31/18 01/07/19  Birdie Sons, MD  benzonatate (TESSALON PERLES) 100 MG capsule Take 1 capsule (100 mg total) by mouth 3 (three) times daily as needed for cough. 11/10/18   Birdie Sons, MD  diphenoxylate-atropine (LOMOTIL)  2.5-0.025 MG tablet Take 1 tablet by mouth as needed for diarrhea or loose stools. 06/10/18   [provider]  fentaNYL (DURAGESIC - DOSED MCG/HR) 50 MCG/HR Place 1 patch (50 mcg total) onto the skin every 3 (three) days. 11/29/18 12/29/18  Vevelyn Francois, NP  fentaNYL (DURAGESIC) 50 MCG/HR Place 1 patch onto the skin every 3 (three) days. 12/29/18   [provider]  NARCAN 4 MG/0.1ML LIQD nasal spray kit Place 0.4 mg into the nose daily as needed (for accidental overdose.).  09/11/17   [provider]  ondansetron (ZOFRAN) 4 MG tablet Take 4 mg by mouth as needed. 08/04/18   [provider]  potassium chloride (K-DUR,KLOR-CON) 10 MEQ tablet Take 10 mEq by mouth daily. 12/31/18   [provider]  predniSONE (DELTASONE) 10 MG tablet Take 1 tablet (10 mg total) by mouth daily with breakfast. Patient not taking: Reported on 01/01/2019 12/15/18   Birdie Sons, MD     Allergies Percocet [oxycodone-acetaminophen]; Aspirin; Codeine; Propoxyphene; and Sulfa antibiotics  Family History  Problem Relation Age of Onset  . Cancer Mother   . Arthritis Mother   . Heart disease Mother   . Diabetes Mother        mellitus, type 2  . Heart disease Father   . Diabetes Sister   . Cancer Brother   . Cancer Brother        lung  . Diabetes Brother     Social History Social History   Tobacco Use  . Smoking status: Current Every Day Smoker    Packs/day: 0.50    Years: 50.00    Pack years: 25.00    Types: Cigarettes  . Smokeless tobacco: Never Used  . Tobacco comment: Previously smoked 2 ppd  Substance Use Topics  . Alcohol use: No    Alcohol/week: 0.0 standard drinks  . Drug use: No    Review of Systems  Constitutional: Fever here Eyes: No visual changes.  ENT: No sore throat. Cardiovascular: Denies chest pain. Respiratory: Positive cough Gastrointestinal: As above Genitourinary: Negative for dysuria. Musculoskeletal: Negative for back pain. Skin:  Negative for rash. Neurological: Negative for headaches    ____________________________________________   PHYSICAL EXAM:  VITAL SIGNS: ED Triage Vitals  Enc Vitals Group     BP 01/01/19 1009 Marland Kitchen)  156/66     Pulse Rate 01/01/19 1009 (!) 113     Resp 01/01/19 1009 (!) 22     Temp 01/01/19 1009 (!) 102.9 F (39.4 C)     Temp Source 01/01/19 1009 Oral     SpO2 01/01/19 1009 96 %     Weight 01/01/19 1015 43.5 kg (96 lb)     Height --      Head Circumference --      Peak Flow --      Pain Score 01/01/19 1015 10     Pain Loc --      Pain Edu? --      Excl. in [place]? --     Constitutional: Eyes closed, arousable to pain Eyes: Conjunctivae are normal.   Nose: No congestion/rhinnorhea. Mouth/Throat: Mucous membranes are moist.    Cardiovascular: Tachycardia, regular rhythm. Grossly normal heart sounds.  Good peripheral circulation. Respiratory: Increased respiratory effort with tachypnea, bibasilar Rales Gastrointestinal: Soft and nontender. No distention.  No CVA tenderness.  Musculoskeletal: No lower extremity tenderness nor edema.  Warm and well perfused Neurologic: . No gross focal neurologic deficits are appreciated.  Skin:  Skin is warm, dry and intact. No rash noted.   ____________________________________________   LABS (all labs ordered are listed, but only abnormal results are displayed)  Labs Reviewed  LACTIC ACID, PLASMA - Abnormal; Notable for the following components:      Result Value   Lactic Acid, Venous 2.0 (*)    All other components within normal limits  CBC WITH DIFFERENTIAL/PLATELET - Abnormal; Notable for the following components:   WBC 11.4 (*)    MCV 100.7 (*)    Neutro Abs 9.3 (*)    All other components within normal limits  URINALYSIS, ROUTINE W REFLEX MICROSCOPIC - Abnormal; Notable for the following components:   Color, Urine YELLOW (*)    APPearance CLEAR (*)    Hgb urine dipstick SMALL (*)    All other components within normal limits    BLOOD GAS, VENOUS - Abnormal; Notable for the following components:   pCO2, Ven 63 (*)    Bicarbonate 31.7 (*)    Acid-Base Excess 3.6 (*)    All other components within normal limits  CULTURE, BLOOD (ROUTINE X 2)  CULTURE, BLOOD (ROUTINE X 2)  URINE CULTURE  MRSA PCR SCREENING  COMPREHENSIVE METABOLIC PANEL  INFLUENZA PANEL BY PCR (TYPE A & B)  PROTIME-INR  APTT  LACTIC ACID, PLASMA  TYPE AND SCREEN   ____________________________________________  EKG  ED ECG REPORT I, Lavonia Drafts, the attending physician, personally viewed and interpreted this ECG.  Date: 01/01/2019  Rhythm: Tachycardia QRS Axis: normal Intervals: normal ST/T Wave abnormalities: Nonspecific changes Narrative Interpretation: no evidence of acute ischemia  ____________________________________________  RADIOLOGY  Chest x-ray consistent with worsening pneumonia right lower lobe ____________________________________________   PROCEDURES  Procedure(s) performed: No  Procedures   Critical Care performed: yes  CRITICAL CARE Performed by: Lavonia Drafts   Total critical care time:30 minutes  Critical care time was exclusive of separately billable procedures and treating other patients.  Critical care was necessary to treat or prevent imminent or life-threatening deterioration.  Critical care was time spent personally by me on the following activities: development of treatment plan with patient and/or surrogate as well as nursing, discussions with consultants, evaluation of patient's response to treatment, examination of patient, obtaining history from patient or surrogate, ordering and performing treatments and interventions, ordering and review of laboratory studies, ordering and review  of radiographic studies, pulse oximetry and re-evaluation of patient's condition.  ____________________________________________   INITIAL IMPRESSION / ASSESSMENT AND PLAN / ED COURSE  Pertinent labs &  imaging results that were available during my care of the patient were reviewed by me and considered in my medical decision making (see chart for details).  Patient presents with fever, tachycardia, tachypnea with rales on exam.  Code sepsis called upon her arrival.  Elevated white count on initial labs, have ordered broad-spectrum antibiotics.  Pending lactic, blood pressure is stable, IV fluids infusing.  Hemoglobin improved from last admission.  IV Protonix given for possible upper GI bleed  Chest x-ray demonstrates worsening pneumonia  Lactic acid 2.0  Admitted to hospitalist service    ____________________________________________   FINAL CLINICAL IMPRESSION(S) / ED DIAGNOSES  Final diagnoses:  Healthcare-associated pneumonia  Upper GI bleed        Note:  This document was prepared using Dragon voice recognition software and may include unintentional dictation errors.   Lavonia Drafts, MD 01/01/19 [phone]

## 2019-01-01 NOTE — Progress Notes (Signed)
CODE SEPSIS - PHARMACY COMMUNICATION  **Broad Spectrum Antibiotics should be administered within 1 hour of Sepsis diagnosis**  Time Code Sepsis Called/Page Received: 1028  Antibiotics Ordered: Cefepime/Vancomycin   Time of 1st antibiotic administration: 1124 Cefepime   Additional action taken by pharmacy: none indicated   If necessary, Name of Provider/Nurse Contacted: N/A  Cheryl [name] ,PharmD Clinical Pharmacist  01/01/2019  1:38 PM

## 2019-01-01 NOTE — H&P (Addendum)
[facility] [place] NAME: Cheryl [name]    MR#:  [id number]  DATE OF BIRTH:  10/07/1951  DATE OF ADMISSION:  01/01/2019  PRIMARY CARE PHYSICIAN: Birdie Sons, MD   REQUESTING/REFERRING PHYSICIAN: Corky Downs  CHIEF COMPLAINT:   Chief Complaint  Patient presents with  . Blood Infection  . GI Bleeding    HISTORY OF PRESENT ILLNESS: Cheryl [name]  is a 68 y.o. female with a known history of anxiety, arthritis, aspiration pneumonia, CHF, COPD, DVT, hyperlipidemia, hypertension, neuropathy-had recurrent admissions due to aspiration pneumonia in the last few months.  In her recovery stage she is fully alert and oriented and able to walk with a walker at home. After discharge last week for the same, she finished her Augmentin for 5 days.  She was doing fine until yesterday.  She again had altered mental status and worsening respiration so brought to emergency room.  As per family she also had vomited few times since yesterday initially it was clear but today last 1 or 2 episodes is converted to dark or black-colored material. As per family she follows all the instructions regarding sitting up with diet, avoiding Strow, eating slow-but eats regular food instead of dysphagia 3 diet as advised by swallow technician. Noted to be septic in ER and given to hospitalist team for admission.  PAST MEDICAL HISTORY:   Past Medical History:  Diagnosis Date  . Acute postoperative pain 04/14/2017  . Allergy   . Anemia   . Anxiety   . Arthritis    fingers  . Aspiration pneumonitis ([place]) 11/24/2015  . Asthma   . CHF (congestive heart failure) ([place])   . Chronic pain   . COPD (chronic obstructive pulmonary disease) ([place])   . Coronary artery disease   . DVT (deep venous thrombosis) ([place])   . Dyspnea   . Dysrhythmia   . Encephalopathy    MULTIPLE TIMES  . GERD (gastroesophageal reflux disease)   . Headache   . Hyperlipidemia   . Hypertension   . Kyphoscoliosis  deformity of spine   . Migraines   . Neuropathy 2010  . Osteoporosis   . Oxygen deficiency   . Peripheral vascular disease ([place])   . Pneumonia   . Pneumonia 11/19/2015  . Pneumonia 10/2016  . Pneumonia    aspiration 2018- 4 times in last year  . Rib fracture 09/19/2017   Displace right 9th rib fracture.   . Vitamin D deficiency   . Wears dentures    full upper and lower    PAST SURGICAL HISTORY:  Past Surgical History:  Procedure Laterality Date  . ABDOMINAL HYSTERECTOMY  1975   Bilaterl Oophorectomy; [place] to IUD infection  . abdomnal aortic stent  05/30/2008   Dr. Quay Burow  . APPENDECTOMY    . APPENDECTOMY    . BACK SURGERY    . cardiac catherization  10/31/2009  . CARDIAC CATHETERIZATION    . CATARACT EXTRACTION W/PHACO Left 08/20/2018   Procedure: CATARACT EXTRACTION PHACO AND INTRAOCULAR LENS PLACEMENT (IOC);  Surgeon: Eulogio Bear, MD;  Location: [facility] ORS;  Service: Ophthalmology;  Laterality: Left;  [place] 00:45.1 AP% 12.3 CDE 5.54 FLUID PACK LOT # [id number]  . CATARACT EXTRACTION W/PHACO Right 09/23/2018   Procedure: CATARACT EXTRACTION PHACO AND INTRAOCULAR LENS PLACEMENT ([place]);  Surgeon: Eulogio Bear, MD;  Location: [facility] ORS;  Service: Ophthalmology;  Laterality: Right;  [place] 00:45.0 CDE 5.19 Fluid Pack lot # [id number]  . CERVICAL  FUSION  C5 - 6/C6-7  . CHOLECYSTECTOMY  1972  . COLONOSCOPY WITH PROPOFOL N/A 07/27/2015   Procedure: COLONOSCOPY WITH PROPOFOL;  Surgeon: Hulen Luster, MD;  Location[facility] ENDOSCOPY;  Service: Gastroenterology;  Laterality: N/A;  . ESOPHAGOGASTRODUODENOSCOPY (EGD) WITH PROPOFOL N/A 07/27/2015   Procedure: ESOPHAGOGASTRODUODENOSCOPY (EGD) WITH PROPOFOL;  Surgeon: Hulen Luster, MD;  Location: [facility] ENDOSCOPY;  Service: Gastroenterology;  Laterality: N/A;  . ESOPHAGOGASTRODUODENOSCOPY (EGD) WITH PROPOFOL N/A 05/19/2018   Procedure: ESOPHAGOGASTRODUODENOSCOPY (EGD) WITH PROPOFOL;  Surgeon: Lucilla Lame, MD;  Location: [facility] ENDOSCOPY;   Service: Endoscopy;  Laterality: N/A;  . EYE SURGERY    . FOOT SURGERY Bilateral    5-6 years per patient  . SPINE SURGERY    . TONSILLECTOMY    . VASCULAR SURGERY     LEG STENTS    SOCIAL HISTORY:  Social History   Tobacco Use  . Smoking status: Current Every Day Smoker    Packs/day: 0.50    Years: 50.00    Pack years: 25.00    Types: Cigarettes  . Smokeless tobacco: Never Used  . Tobacco comment: Previously smoked 2 ppd  Substance Use Topics  . Alcohol use: No    Alcohol/week: 0.0 standard drinks    FAMILY HISTORY:  Family History  Problem Relation Age of Onset  . Cancer Mother   . Arthritis Mother   . Heart disease Mother   . Diabetes Mother        mellitus, type 2  . Heart disease Father   . Diabetes Sister   . Cancer Brother   . Cancer Brother        lung  . Diabetes Brother     DRUG ALLERGIES:  Allergies  Allergen Reactions  . Percocet [Oxycodone-Acetaminophen] Hives and Rash  . Aspirin Nausea And Vomiting and Other (See Comments)  . Codeine Nausea And Vomiting, Nausea Only and Other (See Comments)  . Propoxyphene Nausea Only and Other (See Comments)  . Sulfa Antibiotics Rash and Other (See Comments)    REVIEW OF SYSTEMS:   Patient is drowsy and cannot give review of system.  MEDICATIONS AT HOME:  Prior to Admission medications   Medication Sig Start Date End Date Taking? Authorizing Provider  albuterol (PROVENTIL) (2.5 MG/3ML) 0.083% nebulizer solution Take 3 mLs (2.5 mg total) by nebulization every 4 (four) hours as needed for wheezing. 12/24/17  Yes Birdie Sons, MD  alendronate (FOSAMAX) 70 MG tablet Take 1 tablet (70 mg total) by mouth once a week. Take with a full glass of water on an empty stomach. 12/04/18  Yes Birdie Sons, MD  ALPRAZolam Duanne Moron) 0.5 MG tablet Take 0.5-1 tablets (0.25-0.5 mg total) by mouth at bedtime as needed for anxiety. 08/19/18  Yes Birdie Sons, MD  amLODipine (NORVASC) 5 MG tablet Take 1 tablet (5 mg total)  by mouth daily. 12/09/18  Yes Birdie Sons, MD  atorvastatin (LIPITOR) 40 MG tablet Take 1 tablet (40 mg total) by mouth daily at 6 PM. 11/08/18  Yes Demetrios Loll, MD  baclofen (LIORESAL) 10 MG tablet Take 1 tablet (10 mg total) by mouth 3 (three) times daily as needed for muscle spasms. 08/17/18  Yes Triplett, Cari B, FNP  BELSOMRA 5 MG TABS Take 1 tablet by mouth at bedtime as needed (for insomnia). Patient taking differently: Take 5 mg by mouth at bedtime as needed (insomnia).  11/16/18  Yes Birdie Sons, MD  celecoxib (CELEBREX) 100 MG capsule Take 100 mg by mouth  2 (two) times daily as needed. 11/23/18  Yes [provider]  clopidogrel (PLAVIX) 75 MG tablet Take 75 mg by mouth daily.   Yes [provider]  estradiol (ESTRACE) 0.5 MG tablet Take 1 tablet (0.5 mg total) by mouth every other day. 09/09/18  Yes Birdie Sons, MD  famotidine (PEPCID) 20 MG tablet Take 1 tablet (20 mg total) by mouth daily. 12/09/18  Yes Birdie Sons, MD  ferrous sulfate 325 (65 FE) MG tablet Take 1 tablet (325 mg total) by mouth 2 (two) times daily with a meal. 09/23/18  Yes Birdie Sons, MD  Fluticasone-Salmeterol (ADVAIR DISKUS) 250-50 MCG/DOSE AEPB TAKE ONE PUFF TWICE DAILY. 09/28/18  Yes Birdie Sons, MD  furosemide (LASIX) 20 MG tablet Take 1 tablet (20 mg total) by mouth every other day. 11/08/18  Yes Demetrios Loll, MD  memantine (NAMENDA) 5 MG tablet Take 5 mg by mouth 2 (two) times daily.   Yes [provider]  pantoprazole (PROTONIX) 40 MG tablet Take 40 mg by mouth 2 (two) times daily. Take 1 tablet (40 mg) by mouth scheduled every morning, may repeat dose in evening if needed for heartburn/indigestion.   Yes [provider]  pregabalin (LYRICA) 300 MG capsule Take 300 mg by mouth 2 (two) times daily.   Yes [provider]  silver sulfADIAZINE (SILVADENE) 1 % cream Apply 1 application topically daily as needed (leg infection).    Yes [provider]  tiotropium (SPIRIVA HANDIHALER) 18 MCG inhalation capsule inhale the contents of one capsule in the handihaler once daily Patient taking differently: Place 18 mcg into inhaler and inhale daily. inhale the contents of one capsule in the handihaler once daily 01/23/18  Yes Fisher, Kirstie Peri, MD  triamcinolone (KENALOG) 0.025 % ointment Apply 1 application topically 2 (two) times daily. Patient taking differently: Apply 1 application topically 2 (two) times daily as needed (cracking skin).  06/15/18  Yes Birdie Sons, MD  VENTOLIN HFA 108 [id number] Base) MCG/ACT inhaler Inhale 2 puffs into the lungs every 4 (four) hours as needed for wheezing or shortness of breath. Patient taking differently: Inhale 2 puffs into the lungs every 4 (four) hours as needed for wheezing or shortness of breath.  08/21/18  Yes Birdie Sons, MD  vitamin B-12 (CYANOCOBALAMIN) 1000 MCG tablet Take 1 tablet (1,000 mcg total) by mouth daily. 05/21/18  Yes Wieting, Richard, MD  acetaminophen (TYLENOL) 325 MG tablet Take 2 tablets (650 mg total) by mouth every 6 (six) hours as needed for mild pain (or Fever >/= 101). 05/21/18   Loletha Grayer, MD  amoxicillin-clavulanate (AUGMENTIN) 875-125 MG tablet Take 1 tablet by mouth 2 (two) times daily for 7 days. Patient not taking: Reported on 01/01/2019 12/31/18 01/07/19  Birdie Sons, MD  benzonatate (TESSALON PERLES) 100 MG capsule Take 1 capsule (100 mg total) by mouth 3 (three) times daily as needed for cough. 11/10/18   Birdie Sons, MD  diphenoxylate-atropine (LOMOTIL) 2.5-0.025 MG tablet Take 1 tablet by mouth as needed for diarrhea or loose stools. 06/10/18   [provider]  fentaNYL (DURAGESIC - DOSED MCG/HR) 50 MCG/HR Place 1 patch (50 mcg total) onto the skin every 3 (three) days. 11/29/18 12/29/18  Vevelyn Francois, NP  fentaNYL (DURAGESIC) 50 MCG/HR Place 1 patch onto the skin every 3 (three) days. 12/29/18   [provider]  NARCAN 4 MG/0.1ML LIQD  nasal spray kit Place 0.4 mg into the nose daily as  needed (for accidental overdose.).  09/11/17   [provider]  ondansetron (ZOFRAN) 4 MG tablet Take 4 mg by mouth as needed. 08/04/18   [provider]  potassium chloride (K-DUR,KLOR-CON) 10 MEQ tablet Take 10 mEq by mouth daily. 12/31/18   [provider]  predniSONE (DELTASONE) 10 MG tablet Take 1 tablet (10 mg total) by mouth daily with breakfast. Patient not taking: Reported on 01/01/2019 12/15/18   Birdie Sons, MD      PHYSICAL EXAMINATION:   VITAL SIGNS: Blood pressure (!) 96/47, pulse (!) 101, temperature (!) 102.9 F (39.4 C), temperature source Oral, resp. rate (!) 8, weight 43.5 kg, SpO2 98 %.  GENERAL:  68 y.o.-year-old patient lying in the bed with no acute distress.  EYES: Pupils equal, round, reactive to light and accommodation. No scleral icterus. Extraocular muscles intact.  HEENT: Head atraumatic, normocephalic. Oropharynx and nasopharynx clear.  NECK:  Supple, no jugular venous distention. No thyroid enlargement, no tenderness.  LUNGS: Normal breath sounds bilaterally, no wheezing, bilateral crepitation.  Positive use of accessory muscles of respiration.  On supplemental oxygen CARDIOVASCULAR: S1, S2 normal. No murmurs, rubs, or gallops.  ABDOMEN: Soft, nontender, nondistended. Bowel sounds present. No organomegaly or mass.  EXTREMITIES: No pedal edema, cyanosis, or clubbing.  NEUROLOGIC: Patient is drowsy, opens eyes and speaks slowly on arousal have generalized weakness but follows command. PSYCHIATRIC: The patient is drowsy but arousable and oriented x 3.  SKIN: No obvious rash, lesion, or ulcer.   LABORATORY PANEL:   CBC Recent Labs  Lab 01/01/19 1027  WBC 11.4*  HGB 13.1  HCT 43.3  PLT 303  MCV 100.7*  MCH 30.5  MCHC 30.3  RDW 14.9  LYMPHSABS 1.1  MONOABS 0.8  EOSABS 0.2  BASOSABS 0.1    ------------------------------------------------------------------------------------------------------------------  Chemistries  Recent Labs  Lab 01/01/19 1027  NA 138  K 4.5  CL 101  CO2 26  GLUCOSE 84  BUN 16  CREATININE 0.77  CALCIUM 9.2  AST 18  ALT 10  ALKPHOS 60  BILITOT 0.9   ------------------------------------------------------------------------------------------------------------------ estimated creatinine clearance is 42.9 mL/min (by C-G formula based on SCr of 0.77 mg/dL). ------------------------------------------------------------------------------------------------------------------ No results for input(s): TSH, T4TOTAL, T3FREE, THYROIDAB in the last 72 hours.  Invalid input(s): FREET3   Coagulation profile Recent Labs  Lab 01/01/19 1050  INR 0.97   ------------------------------------------------------------------------------------------------------------------- No results for input(s): DDIMER in the last 72 hours. -------------------------------------------------------------------------------------------------------------------  Cardiac Enzymes No results for input(s): CKMB, TROPONINI, MYOGLOBIN in the last 168 hours.  Invalid input(s): CK ------------------------------------------------------------------------------------------------------------------ Invalid input(s): POCBNP  ---------------------------------------------------------------------------------------------------------------  Urinalysis    Component Value Date/Time   COLORURINE YELLOW (A) 01/01/2019 1029   APPEARANCEUR CLEAR (A) 01/01/2019 1029   APPEARANCEUR Clear 08/19/2014 2200   LABSPEC 1.018 01/01/2019 1029   LABSPEC 1.025 08/19/2014 2200   PHURINE 5.0 01/01/2019 1029   GLUCOSEU NEGATIVE 01/01/2019 1029   GLUCOSEU Negative 08/19/2014 2200   HGBUR SMALL (A) 01/01/2019 1029   BILIRUBINUR NEGATIVE 01/01/2019 1029   BILIRUBINUR Neg 05/25/2018 1638   BILIRUBINUR Negative  08/19/2014 2200   KETONESUR NEGATIVE 01/01/2019 1029   PROTEINUR NEGATIVE 01/01/2019 1029   UROBILINOGEN 0.2 05/25/2018 1638   NITRITE NEGATIVE 01/01/2019 1029   LEUKOCYTESUR NEGATIVE 01/01/2019 1029   LEUKOCYTESUR Negative 08/19/2014 2200     RADIOLOGY: Dg Chest Port 1 View  Result Date: 01/01/2019 CLINICAL DATA:  Blood infection, GI bleed. EXAM: PORTABLE CHEST 1 VIEW COMPARISON:  12/30/2018 FINDINGS: Cardiac enlargement. Negative for pulmonary edema. Mild patchy  bibasilar airspace disease has progressed since the prior study. No significant effusion. IMPRESSION: Progression of mild bibasilar atelectasis/pneumonia. Electronically Signed   By: Franchot Gallo M.D.   On: 01/01/2019 11:01    EKG: Orders placed or performed during the hospital encounter of 12/21/18  . ED EKG  . ED EKG  . ED EKG 12-Lead  . ED EKG 12-Lead  . EKG    IMPRESSION AND PLAN:  *Sepsis Due to aspiration pneumonia Recurrent episodes Noncompliant with dietary restrictions  IV Unasyn IV fluids Keep n.p.o. and get swallow technician evaluation. As her pneumonia is recurrent, and I did not see any CT scan done, I would like to get a CT scan chest to rule out any other pathologies/effusions. As this is a recurrent problem, I have also called palliative care consult. Cultures are sent.  *Hematemesis Hemoglobin is actually higher than the last admission. Will follow serial hemoglobin and avoid anticoagulation.  *COPD with ongoing tobacco abuse Currently no wheezing, continue inhalers but no need for steroids.  *Chronic systolic CHF Ejection fraction is 45%, no exacerbation symptoms currently.   All the records are reviewed and case discussed with ED provider. Management plans discussed with the patient, family and they are in agreement.  CODE STATUS: Full code. Code Status History    Date Active Date Inactive Code Status Order ID Comments User Context   12/21/2018 1827 12/22/2018 1644 Full Code  [id number]  Fritzi Mandes, MD ED   11/03/2018 0756 11/08/2018 1557 Full Code [id number]  Max Sane, MD ED   06/18/2018 1428 06/22/2018 1454 Full Code [id number]  Loletha Grayer, MD ED   06/01/2018 1535 06/05/2018 2007 Full Code [id number]  Vaughan Basta, MD Inpatient   05/14/2018 1351 05/21/2018 1558 Partial Code [id number]  Saundra Shelling, MD ED   02/08/2018 1835 02/09/2018 1920 Full Code [id number]  Vaughan Basta, MD Inpatient   02/05/2018 2025 02/07/2018 2119 Full Code [id number]  Hillary Bow, MD ED   04/29/2017 1314 05/02/2017 2148 Full Code [id number]  Epifanio Lesches, MD ED   12/23/2016 1745 12/25/2016 1849 Full Code [id number]  Theodoro Grist, MD ED   11/06/2016 0900 11/07/2016 1853 Full Code [id number]  Max Sane, MD Inpatient   11/05/2016 1401 11/06/2016 0900 Partial Code [id number]  Bettey Costa, MD Inpatient   06/03/2016 0253 06/05/2016 1945 Full Code [id number]  Quintella Baton, MD Inpatient   11/19/2015 1218 11/24/2015 1624 Full Code [id number]  Demetrios Loll, MD Inpatient   11/09/2015 2302 11/13/2015 1851 Full Code [id number]  Lytle Butte, MD ED   10/17/2015 2226 10/22/2015 1303 Full Code [id number]  Fritzi Mandes, MD Inpatient   10/09/2015 1738 10/12/2015 1934 Full Code [id number]  Demetrios Loll, MD Inpatient   08/01/2015 0008 08/03/2015 1454 Full Code [id number]  Lance Coon, MD Inpatient      Discussed with patient's husband and daughter in the room. TOTAL TIME TAKING CARE OF THIS PATIENT: 50 minutes.    Vaughan Basta M.D on 01/01/2019   Between 7am to 6pm - Pager - [phone]  After 6pm go to [email or web address] - password [email or web address] [facility]  Office  [phone]  CC: Primary care physician; Birdie Sons, MD   Note: This dictation was prepared with Dragon dictation along with smaller phrase technology. Any transcriptional errors that result from this process are unintentional.

## 2019-01-01 NOTE — ED Triage Notes (Signed)
Pt has had coffee ground emesis X 2 days. Today has fever and tachycardia in triage. Family reports PNA with this presentation in past. No blood thinners. Somewhat lethargic but responds.

## 2019-01-01 NOTE — Telephone Encounter (Signed)
error 

## 2019-01-01 NOTE — Progress Notes (Signed)
Family Meeting Note  Advance Directive:yes  Today a meeting took place with the spouse and daughter.  Patient is unable to participate due UU:[id number] capacity drowsy   The following clinical team members were present during this meeting:MD  The following were discussed:Patient's diagnosis: Recurrent aspiration pneumonia, acute systolic congestive heart failure, Patient's progosis: Unable to determine and Goals for treatment: Full Code  Additional follow-up to be provided: Palliative care  Time spent during discussion:20 minutes  Altamese Dilling, MD

## 2019-01-01 NOTE — ED Notes (Signed)
Pt requesting to use bedpan - pt placed on bedpan for 5 minutes with approx 71ml of output - still reports she needs to void - placed external cath back in place

## 2019-01-02 DIAGNOSIS — R1312 Dysphagia, oropharyngeal phase: Secondary | ICD-10-CM

## 2019-01-02 DIAGNOSIS — K920 Hematemesis: Secondary | ICD-10-CM

## 2019-01-02 LAB — BASIC METABOLIC PANEL
Anion gap: 7 (ref 5–15)
BUN: 11 mg/dL (ref 8–23)
CALCIUM: 7.6 mg/dL — AB (ref 8.9–10.3)
CO2: 26 mmol/L (ref 22–32)
Chloride: 108 mmol/L (ref 98–111)
Creatinine, Ser: 0.62 mg/dL (ref 0.44–1.00)
GFR calc Af Amer: >60 mL/min (ref >60)
GFR calc non Af Amer: >60 mL/min (ref >60)
Glucose, Bld: 76 mg/dL (ref 70–99)
Potassium: 3.9 mmol/L (ref 3.5–5.1)
Sodium: 141 mmol/L (ref 135–145)

## 2019-01-02 LAB — CBC
HCT: 31.9 % — ABNORMAL LOW (ref 36.0–46.0)
HEMOGLOBIN: 9.6 g/dL — AB (ref 12.0–15.0)
MCH: 31.1 pg (ref 26.0–34.0)
MCHC: 30.1 g/dL (ref 30.0–36.0)
MCV: 103.2 fL — ABNORMAL HIGH (ref 80.0–100.0)
Platelets: 208 10*3/uL (ref 150–400)
RBC: 3.09 MIL/uL — ABNORMAL LOW (ref 3.87–5.11)
RDW: 14.7 % (ref 11.5–15.5)
WBC: 9.3 10*3/uL (ref 4.0–10.5)
nRBC: 0 % (ref 0.0–0.2)

## 2019-01-02 LAB — URINE CULTURE: Culture: NO GROWTH

## 2019-01-02 MED ORDER — IPRATROPIUM-ALBUTEROL 0.5-2.5 (3) MG/3ML IN SOLN
3.0000 mL | Freq: Four times a day (QID) | RESPIRATORY_TRACT | Status: DC
  Administered 2019-01-02 (×2): 3 mL via RESPIRATORY_TRACT
  Filled 2019-01-02 (×2): qty 3

## 2019-01-02 MED ORDER — PANTOPRAZOLE SODIUM 40 MG IV SOLR
40.0000 mg | Freq: Two times a day (BID) | INTRAVENOUS | Status: DC
  Administered 2019-01-02 – 2019-01-03 (×3): 40 mg via INTRAVENOUS
  Filled 2019-01-02 (×3): qty 40

## 2019-01-02 NOTE — Progress Notes (Signed)
Pt refusing to take her medication this shift

## 2019-01-02 NOTE — Consult Note (Addendum)
Vonda Antigua, MD [address], [place], [place], [place], [address] [address], [place], [place], [place], [address] Phone: [phone]  Fax: [phone]  Consultation  Referring Provider:     Dr. Estanislado Pandy Primary Care Physician:  Birdie Sons, MD Reason for Consultation:     Dysphagia  Date of Admission:  01/01/2019 Date of Consultation:  01/02/2019         HPI:   Cheryl [name] is a 69 y.o. female admitted with recurrent aspiration pneumonia with GI consulted for "dysphagia, family wants endoscopy evaluation".  Patient's husband is at bedside and is helping provide history.  Their concern is that they were told previously that patient has a hiatal hernia 4 years ago, and they would like to know if that is contributing to her recurrent aspiration pneumonia.  They deny any episodes of food impaction or patient's inability to swallow food.  Patient has been previously diagnosed with oropharyngeal dysphagia.  As per the latest speech and swallow note on this admission, "During last bedside swallow assessment about a week ago,  ST reviewed pt's hx of oropharyngeal dysphagia and results of MBSS in 10/2015 at length which revealed "swallow delay resulted in deep laryngeal penetration to the level of the vocal cords with thin liquids. This penetration was silent and was not observed to clear by ST." ST also discussed impact of longstanding hx of esophageal dysmotility including GERD and a large hiatal hernia and possible pulmonary impact if reflux is aspirated."  Patient has had a very recent upper endoscopy in June 2019, that reported a normal esophagus, normal duodenum and gastritis which was biopsied.  Hiatal hernia was not reported on this endoscopy.  I have personally reviewed the images from this endoscopy and do not see any evidence of hiatal hernia in those images.  The last imaging reporting this hiatal hernia was a CT abdomen pelvis in November 2016 that reports a moderate size  hiatal hernia.  The husband also reports that patient had an episode of emesis yesterday morning which consisted of black material.  No coffee-ground appearing material.  No red or dark blood.  No episodes since then.  Past Medical History:  Diagnosis Date  . Acute postoperative pain 04/14/2017  . Allergy   . Anemia   . Anxiety   . Arthritis    fingers  . Aspiration pneumonitis ([place]) 11/24/2015  . Asthma   . CHF (congestive heart failure) ([place])   . Chronic pain   . COPD (chronic obstructive pulmonary disease) ([place])   . Coronary artery disease   . DVT (deep venous thrombosis) ([place])   . Dyspnea   . Dysrhythmia   . Encephalopathy    MULTIPLE TIMES  . GERD (gastroesophageal reflux disease)   . Headache   . Hyperlipidemia   . Hypertension   . Kyphoscoliosis deformity of spine   . Migraines   . Neuropathy 2010  . Osteoporosis   . Oxygen deficiency   . Peripheral vascular disease ([place])   . Pneumonia   . Pneumonia 11/19/2015  . Pneumonia 10/2016  . Pneumonia    aspiration 2018- 4 times in last year  . Rib fracture 09/19/2017   Displace right 9th rib fracture.   . Vitamin D deficiency   . Wears dentures    full upper and lower    Past Surgical History:  Procedure Laterality Date  . ABDOMINAL HYSTERECTOMY  1975   Bilaterl Oophorectomy; [place] to IUD infection  . abdomnal aortic stent  05/30/2008   Dr. Quay Burow  . APPENDECTOMY    . APPENDECTOMY    . BACK SURGERY    . cardiac catherization  10/31/2009  . CARDIAC CATHETERIZATION    . CATARACT EXTRACTION W/PHACO Left 08/20/2018   Procedure: CATARACT EXTRACTION PHACO AND INTRAOCULAR LENS PLACEMENT (IOC);  Surgeon: Eulogio Bear, MD;  Location: [facility] ORS;  Service: Ophthalmology;  Laterality: Left;  [place] 00:45.1 AP% 12.3 CDE 5.54 FLUID PACK LOT # [id number]  . CATARACT EXTRACTION W/PHACO Right 09/23/2018   Procedure: CATARACT EXTRACTION PHACO AND INTRAOCULAR LENS PLACEMENT ([place]);  Surgeon: Eulogio Bear, MD;   Location: [facility] ORS;  Service: Ophthalmology;  Laterality: Right;  [place] 00:45.0 CDE 5.19 Fluid Pack lot # [id number]  . CERVICAL FUSION  C5 - 6/C6-7  . CHOLECYSTECTOMY  1972  . COLONOSCOPY WITH PROPOFOL N/A 07/27/2015   Procedure: COLONOSCOPY WITH PROPOFOL;  Surgeon: Hulen Luster, MD;  Location: [facility] ENDOSCOPY;  Service: Gastroenterology;  Laterality: N/A;  . ESOPHAGOGASTRODUODENOSCOPY (EGD) WITH PROPOFOL N/A 07/27/2015   Procedure: ESOPHAGOGASTRODUODENOSCOPY (EGD) WITH PROPOFOL;  Surgeon: Hulen Luster, MD;  Location: [facility] ENDOSCOPY;  Service: Gastroenterology;  Laterality: N/A;  . ESOPHAGOGASTRODUODENOSCOPY (EGD) WITH PROPOFOL N/A 05/19/2018   Procedure: ESOPHAGOGASTRODUODENOSCOPY (EGD) WITH PROPOFOL;  Surgeon: Lucilla Lame, MD;  Location: [facility] ENDOSCOPY;  Service: Endoscopy;  Laterality: N/A;  . EYE SURGERY    . FOOT SURGERY Bilateral    5-6 years per patient  . SPINE SURGERY    . TONSILLECTOMY    . VASCULAR SURGERY     LEG STENTS    Prior to Admission medications   Medication Sig Start Date End Date Taking? Authorizing Provider  albuterol (PROVENTIL) (2.5 MG/3ML) 0.083% nebulizer solution Take 3 mLs (2.5 mg total) by nebulization every 4 (four) hours as needed for wheezing. 12/24/17  Yes Birdie Sons, MD  alendronate (FOSAMAX) 70 MG tablet Take 1 tablet (70 mg total) by mouth once a week. Take with a full glass of water on an empty stomach. 12/04/18  Yes Birdie Sons, MD  ALPRAZolam Duanne Moron) 0.5 MG tablet Take 0.5-1 tablets (0.25-0.5 mg total) by mouth at bedtime as needed for anxiety. 08/19/18  Yes Birdie Sons, MD  amLODipine (NORVASC) 5 MG tablet Take 1 tablet (5 mg total) by mouth daily. 12/09/18  Yes Birdie Sons, MD  atorvastatin (LIPITOR) 40 MG tablet Take 1 tablet (40 mg total) by mouth daily at 6 PM. 11/08/18  Yes Demetrios Loll, MD  baclofen (LIORESAL) 10 MG tablet Take 1 tablet (10 mg total) by mouth 3 (three) times daily as needed for muscle spasms. 08/17/18  Yes Triplett, Cari B, FNP   BELSOMRA 5 MG TABS Take 1 tablet by mouth at bedtime as needed (for insomnia). Patient taking differently: Take 5 mg by mouth at bedtime as needed (insomnia).  11/16/18  Yes Birdie Sons, MD  celecoxib (CELEBREX) 100 MG capsule Take 100 mg by mouth 2 (two) times daily as needed. 11/23/18  Yes [provider]  clopidogrel (PLAVIX) 75 MG tablet Take 75 mg by mouth daily.   Yes [provider]  estradiol (ESTRACE) 0.5 MG tablet Take 1 tablet (0.5 mg total) by mouth every other day. 09/09/18  Yes Birdie Sons, MD  famotidine (PEPCID) 20 MG tablet Take 1 tablet (20 mg total) by mouth daily. 12/09/18  Yes Birdie Sons, MD  ferrous sulfate 325 (65 FE) MG tablet Take 1 tablet (325 mg total) by mouth 2 (two) times daily with  a meal. 09/23/18  Yes Birdie Sons, MD  Fluticasone-Salmeterol (ADVAIR DISKUS) 250-50 MCG/DOSE AEPB TAKE ONE PUFF TWICE DAILY. 09/28/18  Yes Birdie Sons, MD  furosemide (LASIX) 20 MG tablet Take 1 tablet (20 mg total) by mouth every other day. 11/08/18  Yes Demetrios Loll, MD  memantine (NAMENDA) 5 MG tablet Take 5 mg by mouth 2 (two) times daily.   Yes [provider]  pantoprazole (PROTONIX) 40 MG tablet Take 40 mg by mouth 2 (two) times daily. Take 1 tablet (40 mg) by mouth scheduled every morning, may repeat dose in evening if needed for heartburn/indigestion.   Yes [provider]  pregabalin (LYRICA) 300 MG capsule Take 300 mg by mouth 2 (two) times daily.   Yes [provider]  silver sulfADIAZINE (SILVADENE) 1 % cream Apply 1 application topically daily as needed (leg infection).    Yes [provider]  tiotropium (SPIRIVA HANDIHALER) 18 MCG inhalation capsule inhale the contents of one capsule in the handihaler once daily Patient taking differently: Place 18 mcg into inhaler and inhale daily. inhale the contents of one capsule in the handihaler once daily 01/23/18  Yes Fisher, Kirstie Peri, MD  triamcinolone  (KENALOG) 0.025 % ointment Apply 1 application topically 2 (two) times daily. Patient taking differently: Apply 1 application topically 2 (two) times daily as needed (cracking skin).  06/15/18  Yes Birdie Sons, MD  VENTOLIN HFA 108 [phone] Base) MCG/ACT inhaler Inhale 2 puffs into the lungs every 4 (four) hours as needed for wheezing or shortness of breath. Patient taking differently: Inhale 2 puffs into the lungs every 4 (four) hours as needed for wheezing or shortness of breath.  08/21/18  Yes Birdie Sons, MD  vitamin B-12 (CYANOCOBALAMIN) 1000 MCG tablet Take 1 tablet (1,000 mcg total) by mouth daily. 05/21/18  Yes Wieting, Richard, MD  acetaminophen (TYLENOL) 325 MG tablet Take 2 tablets (650 mg total) by mouth every 6 (six) hours as needed for mild pain (or Fever >/= 101). 05/21/18   Loletha Grayer, MD  amoxicillin-clavulanate (AUGMENTIN) 875-125 MG tablet Take 1 tablet by mouth 2 (two) times daily for 7 days. Patient not taking: Reported on 01/01/2019 12/31/18 01/07/19  Birdie Sons, MD  benzonatate (TESSALON PERLES) 100 MG capsule Take 1 capsule (100 mg total) by mouth 3 (three) times daily as needed for cough. 11/10/18   Birdie Sons, MD  diphenoxylate-atropine (LOMOTIL) 2.5-0.025 MG tablet Take 1 tablet by mouth as needed for diarrhea or loose stools. 06/10/18   [provider]  fentaNYL (DURAGESIC - DOSED MCG/HR) 50 MCG/HR Place 1 patch (50 mcg total) onto the skin every 3 (three) days. 11/29/18 12/29/18  Vevelyn Francois, NP  fentaNYL (DURAGESIC) 50 MCG/HR Place 1 patch onto the skin every 3 (three) days. 12/29/18   [provider]  NARCAN 4 MG/0.1ML LIQD nasal spray kit Place 0.4 mg into the nose daily as needed (for accidental overdose.).  09/11/17   [provider]  ondansetron (ZOFRAN) 4 MG tablet Take 4 mg by mouth as needed. 08/04/18   [provider]  potassium chloride (K-DUR,KLOR-CON) 10 MEQ tablet Take 10 mEq by mouth daily. 12/31/18   [provider]  predniSONE (DELTASONE) 10 MG tablet Take 1 tablet (10 mg total) by mouth daily with breakfast. Patient not taking: Reported on 01/01/2019 12/15/18   Birdie Sons, MD    Family History  Problem Relation Age of Onset  . Cancer Mother   .  Arthritis Mother   . Heart disease Mother   . Diabetes Mother        mellitus, type 2  . Heart disease Father   . Diabetes Sister   . Cancer Brother   . Cancer Brother        lung  . Diabetes Brother      Social History   Tobacco Use  . Smoking status: Current Every Day Smoker    Packs/day: 0.50    Years: 50.00    Pack years: 25.00    Types: Cigarettes  . Smokeless tobacco: Never Used  . Tobacco comment: Previously smoked 2 ppd  Substance Use Topics  . Alcohol use: No    Alcohol/week: 0.0 standard drinks  . Drug use: No    Allergies as of 01/01/2019 - Review Complete 01/01/2019  Allergen Reaction Noted  . Percocet [oxycodone-acetaminophen] Hives and Rash 03/30/2015  . Aspirin Nausea And Vomiting and Other (See Comments) 03/30/2015  . Codeine Nausea And Vomiting, Nausea Only, and Other (See Comments) 02/10/2014  . Propoxyphene Nausea Only and Other (See Comments) 07/03/2015  . Sulfa antibiotics Rash and Other (See Comments) 02/10/2014    Review of Systems:    All systems reviewed and negative except where noted in HPI.   Physical Exam:  Vital signs in last 24 hours: Vitals:   01/02/19 0747 01/02/19 0813 01/02/19 1411 01/02/19 1500  BP: (!) 111/56   (!) 112/55  Pulse: 95   100  Resp: 16   16  Temp: 97.9 F (36.6 C)   98.2 F (36.8 C)  TempSrc: Axillary   Oral  SpO2: 95% 94% 92% 95%  Weight:      Height:       Last BM Date: 01/01/19 General:   Pleasant, cooperative in NAD Head:  Normocephalic and atraumatic. Eyes:   No icterus.   Conjunctiva pink. PERRLA. Ears:  Normal auditory acuity. Neck:  Supple; no masses or thyroidomegaly Lungs: Respirations even and unlabored. Lungs clear to auscultation  bilaterally.   No wheezes, crackles, or rhonchi.  Abdomen:  Soft, nondistended, nontender. Normal bowel sounds. No appreciable masses or hepatomegaly.  No rebound or guarding.  Neurologic:  grossly normal neurologically. Skin:  Intact without significant lesions or rashes. Cervical Nodes:  No significant cervical adenopathy. Psych:  Alert and cooperative. Normal affect.  LAB RESULTS: Recent Labs    01/01/19 1027 01/02/19 0433  WBC 11.4* 9.3  HGB 13.1 9.6*  HCT 43.3 31.9*  PLT 303 208   BMET Recent Labs    01/01/19 1027 01/02/19 0433  NA 138 141  K 4.5 3.9  CL 101 108  CO2 26 26  GLUCOSE 84 76  BUN 16 11  CREATININE 0.77 0.62  CALCIUM 9.2 7.6*   LFT Recent Labs    01/01/19 1027  PROT 8.0  ALBUMIN 4.7  AST 18  ALT 10  ALKPHOS 60  BILITOT 0.9   PT/INR Recent Labs    01/01/19 1050  LABPROT 12.8  INR 0.97    STUDIES: Ct Chest W Contrast  Result Date: 01/01/2019 CLINICAL DATA:  Unresolved pneumonia. Worsening shortness of breath. EXAM: CT CHEST WITH CONTRAST TECHNIQUE: Multidetector CT imaging of the chest was performed during intravenous contrast administration. CONTRAST:  76m OMNIPAQUE IOHEXOL 300 MG/ML  SOLN COMPARISON:  06/01/2018 FINDINGS: Cardiovascular: Normal heart size. No pericardial effusion. Aortic atherosclerosis. Calcification in the LAD and RCA coronary arteries noted. Mediastinum/Nodes: Normal appearance of the thyroid gland. The trachea appears patent and is midline. Moderate  hiatal hernia. Multiple non pathologically enlarged right paratracheal, prevascular lymph nodes are identified. Subcarinal lymph node is enlarged measuring 1.6 cm. Unchanged from previous exam. No left hilar adenopathy. Right hilar lymph node measures prominent right hilar lymph nodes are identified. Index node measures 0.9 cm, image 68/2 index right hilar lymph node measures 1 cm, image 63/2. Previously 0.9 cm. Lungs/Pleura: No pleural effusion identified. Progressive airspace  consolidation and atelectasis within both lower lobes identified right greater than left. Mild changes of paraseptal emphysema. Subsegmental atelectasis and volume loss within the lingula and right middle lobe identified. New subpleural nodular density within the right upper lobe measures 7 mm, image 47/3. Are Upper Abdomen: There is intrahepatic bile duct dilatation. The common bile duct is not visualized in its entirety. Proximally this measures 1.2 cm. Compared with CT of the abdomen from 08/04/2017 this is slightly progressive. Etiology indeterminate. Musculoskeletal: Right lateral eighth and ninth rib fractures are identified and appear unchanged from previous exam. Marked degenerative disc disease noted within the lower cervical spine. A IMPRESSION: 1. A there is progressive worsening aeration to both lung bases with increasing areas of atelectasis and airspace consolidation especially in the right lower lobe. Subsegmental atelectasis and volume loss is also noted within the lingula and right middle lobe which appears slightly progressive from previous exam. Imaging findings are compatible with either recurrent and progressive pneumonia and/or aspiration. Underlying endobronchial lesion can not be excluded. 2. Persistent enlarged subcarinal lymph nodes. In the setting of recurrent pneumonia these may be reactive etiology. If there is a central obstructing lesion thin this could represent metastatic adenopathy. Consider further evaluation with bronchoscopy. 3. New, nonspecific subpleural nodule within the posterior right upper lobe measuring 7 mm. Non-contrast chest CT at 6-12 months is recommended. If the nodule is stable at time of repeat CT, then future CT at 18-24 months (from today's scan) is considered optional for low-risk patients, but is recommended for high-risk patients. This recommendation follows the consensus statement: Guidelines for Management of Incidental Pulmonary Nodules Detected on CT  Images: From the Fleischner Society 2017; Radiology 2017; 284:228-243. 4. Increase intrahepatic bile duct dilatation and mild increase caliber of the common bile duct. The common bile duct is not visualized and its entirety. This is slightly progressive when compared with CT of the abdomen from 08/04/2017. 5. Mild paraseptal emphysema with diffuse bronchial wall thickening. 6. Aortic atherosclerosis and coronary artery atherosclerotic calcifications. Electronically Signed   By: Kerby Moors M.D.   On: 01/01/2019 15:25   Dg Chest Port 1 View  Result Date: 01/01/2019 CLINICAL DATA:  Blood infection, GI bleed. EXAM: PORTABLE CHEST 1 VIEW COMPARISON:  12/30/2018 FINDINGS: Cardiac enlargement. Negative for pulmonary edema. Mild patchy bibasilar airspace disease has progressed since the prior study. No significant effusion. IMPRESSION: Progression of mild bibasilar atelectasis/pneumonia. Electronically Signed   By: Franchot Gallo M.D.   On: 01/01/2019 11:01      Impression / Plan:   Cheryl [name] is a 68 y.o. y/o female with recurrent aspiration pneumonia previously diagnosed with oropharyngeal dysphagia based on 2016 speech pathology evaluation with GI being consulted for evaluation of upper endoscopy due to a hiatal hernia reported on previous imaging  Patient recently had an upper endoscopy in June 2019 which did not reveal a hiatal hernia I reviewed the images myself and do not see any evidence of a hiatal hernia  In addition, I discussed with the patient and family that a hiatal hernia by itself is unlikely to lead to  aspiration events unless it is extremely large, in which case it would have been evident on her upper endoscopy.  The 2016 CT scan was much earlier than the upper endoscopy.  Her recurrent aspiration events are likely due to her oropharyngeal dysphagia and possibly not following the recommended diet by speech and swallow team.  I also discussed with the patient and family that  undergoing an upper endoscopy in the setting of the CT on this admission revealing progressive worsening aeration to both lung bases with increasing atelectasis, will have higher risks than benefits as sedation used during the endoscopy can lead to hypoxia in the setting.  An upper endoscopy at this time is unlikely to change management given that oropharyngeal dysphagia has been diagnosed in the past, and an upper endoscopy recently did not reveal any evidence of obstruction.  However, a less invasive and safer way to evaluate her esophagus would be an esophagram instead.  However, the patient would have to be willing to comply with instructions and images needed for this testing.  It appears that patient did not comply with speech and swallow eval today.  Recommendation: Would recommend primary team to establish goals of care with the patient and if patient is willing to undergo further work-up for her dysphagia, repeat swallow study with speech pathology for her oropharyngeal dysphagia, and if patient willing consider an esophagram as well.  The esophagram would again confirm the absence of a hiatal hernia as well.  Patient had an episode later episode of dark appearing emesis yesterday morning with no subsequent episodes since then. She is otherwise hemodynamically stable.  Her hemoglobin is at her baseline which is around 9-10. I have switched her Protonix to Protonix 40 IV twice daily in case this occurred due to esophagitis Continue serial CBCs and transfuse PRN No evidence of active GI bleeding at this time If patient has recurrent episodes of bleeding, please page GI for evaluation of endoscopy, at which time we can discuss with anesthesia if they would be willing to provide sedation for this high risk patient in the setting of GI bleeding if it occurs.    Patient should follow-up in GI clinic with [facility] clinic GI as an outpatient, and an upper endoscopy can be considered after acute  medical issues and pneumonia resolved and it is safer to proceed with endoscopy with sedation to rule out any underlying lesions.  Thank you for involving me in the care of this patient.      LOS: 1 day   Virgel Manifold, MD  01/02/2019, 5:47 PM

## 2019-01-02 NOTE — Progress Notes (Signed)
SLP Cancellation Note  Patient Details Name: Cheryl [name] MRN: [id number] DOB: 1951/03/01   Cancelled treatment:       Reason Eval/Treat Not Completed: (Pt refused to participate in BSE) Chart reviewed. Pt, husband and sister interviewed at length. Discussed pt's recent hx, hospitalization, and BSE on 12/22/2018 with MD, pt, husband and sister. During last bedside swallow assessment about a week ago,  ST reviewed pt's hx of oropharyngeal dysphagia and results of MBSS in 10/2015 at length which revealed "swallow delay resulted in deep laryngeal penetration to the level of the vocal cords with thin liquids. This penetration was silent and was not observed to clear by ST." ST also discussed impact of longstanding hx of esophageal dysmotility including GERD and a large hiatal hernia and possible pulmonary impact if reflux is aspirated.   Today, Pt refused all SLP attempts to perform BSE despite max encouragement. Pt also states she does not want any more tests. ST explained silent laryngeal penetration would not be noted at a bedside evaluation. After ST services were explained further in regards to another objective swallow study(MBSS), pt immediately stated "No". When asked again if she would want to do a MBSS or drink thickened liquids again as recommended before, pt again stated "No". Discussed strict aspiration precautions and general safe swallow rec's w/ pt, husband & sister; discussed possible impact of Esophageal phase dysmotility.  Recommended f/u w/ GI to learn more about the Hiatal Hernia and impact of such along w/ GERD on the overall swallowing as well as Pulmonary impact if Reflux is aspirated. Evaluation findings and thoughts discussed w/ MD/NSG and explained pt's risk for aspiration from a pharyngeal and Esophageal phase standpoint as well as prior dysphagia and denying need for further assessment and thickened liquids if recommended. As pt is currently NPO, MD requested SLP enter Mech soft  diet with thin liquids in accordance with pt wishes.  SLP to sign off at this time, as pt states refusal of ST services. Please re-consult if pt is interested in objective swallow assessment (MBSS) in future.   Mack Thurmon, MA, CCC-SLP 01/02/2019, 10:31 AM

## 2019-01-02 NOTE — Progress Notes (Signed)
[facility] [place] [facility]   PATIENT NAME: Cheryl [name]    MR#:  [id number]  DATE OF BIRTH:  [birth year]  SUBJECTIVE:  CHIEF COMPLAINT:   Chief Complaint  Patient presents with  . Blood Infection  . GI Bleeding  Patient seen and evaluated today Has cough No evidence of hematemesis, hemoptysis Has generalized weakness On oxygen via nasal cannula  REVIEW OF SYSTEMS:    ROS  CONSTITUTIONAL: No documented fever. Has fatigue, weakness. No weight gain, no weight loss.  EYES: No blurry or double vision.  ENT: No tinnitus. No postnasal drip. No redness of the oropharynx.  RESPIRATORY: occasional cough, no wheeze, no hemoptysis. No dyspnea.  CARDIOVASCULAR: No chest pain. No orthopnea. No palpitations. No syncope.  GASTROINTESTINAL: No nausea, no vomiting or diarrhea. No abdominal pain. No melena or hematochezia.  GENITOURINARY: No dysuria or hematuria.  ENDOCRINE: No polyuria or nocturia. No heat or cold intolerance.  HEMATOLOGY: No anemia. No bruising. No bleeding.  INTEGUMENTARY: No rashes. No lesions.  MUSCULOSKELETAL: No arthritis. No swelling. No gout.  NEUROLOGIC: No numbness, tingling, or ataxia. No seizure-type activity.  PSYCHIATRIC: No anxiety. No insomnia. No ADD.   DRUG ALLERGIES:   Allergies  Allergen Reactions  . Percocet [Oxycodone-Acetaminophen] Hives and Rash  . Aspirin Nausea And Vomiting and Other (See Comments)  . Codeine Nausea And Vomiting, Nausea Only and Other (See Comments)  . Propoxyphene Nausea Only and Other (See Comments)  . Sulfa Antibiotics Rash and Other (See Comments)    VITALS:  Blood pressure (!) 111/56, pulse 95, temperature 97.9 F (36.6 C), temperature source Axillary, resp. rate 16, height 4\' 10"  (1.473 m), weight 43.5 kg, SpO2 94 %.  PHYSICAL EXAMINATION:   Physical Exam  GENERAL:  67 y.o.-year-old patient lying in the bed with no acute distress.  EYES: Pupils equal, round, reactive to light and  accommodation. No scleral icterus. Extraocular muscles intact.  HEENT: Head atraumatic, normocephalic. Oropharynx and nasopharynx clear.  NECK:  Supple, no jugular venous distention. No thyroid enlargement, no tenderness.  LUNGS: Decreased breath sounds bilaterally, bilateral rales heard. No use of accessory muscles of respiration.  CARDIOVASCULAR: S1, S2 normal. No murmurs, rubs, or gallops.  ABDOMEN: Soft, nontender, nondistended. Bowel sounds present. No organomegaly or mass.  EXTREMITIES: No cyanosis, clubbing or edema b/l.    NEUROLOGIC: Cranial nerves II through XII are intact. No focal Motor or sensory deficits b/l.   PSYCHIATRIC: The patient is alert and oriented x 3.  SKIN: No obvious rash, lesion, or ulcer.   LABORATORY PANEL:   CBC Recent Labs  Lab 01/02/19 0433  WBC 9.3  HGB 9.6*  HCT 31.9*  PLT 208   ------------------------------------------------------------------------------------------------------------------ Chemistries  Recent Labs  Lab 01/01/19 1027 01/02/19 0433  NA 138 141  K 4.5 3.9  CL 101 108  CO2 26 26  GLUCOSE 84 76  BUN 16 11  CREATININE 0.77 0.62  CALCIUM 9.2 7.6*  AST 18  --   ALT 10  --   ALKPHOS 60  --   BILITOT 0.9  --    ------------------------------------------------------------------------------------------------------------------  Cardiac Enzymes No results for input(s): TROPONINI in the last 168 hours. ------------------------------------------------------------------------------------------------------------------  RADIOLOGY:  Ct Chest W Contrast  Result Date: 01/01/2019 CLINICAL DATA:  Unresolved pneumonia. Worsening shortness of breath. EXAM: CT CHEST WITH CONTRAST TECHNIQUE: Multidetector CT imaging of the chest was performed during intravenous contrast administration. CONTRAST:  [id number] OMNIPAQUE IOHEXOL 300 MG/ML  SOLN COMPARISON:  06/01/2018 FINDINGS: Cardiovascular: Normal heart  size. No pericardial effusion. Aortic  atherosclerosis. Calcification in the LAD and RCA coronary arteries noted. Mediastinum/Nodes: Normal appearance of the thyroid gland. The trachea appears patent and is midline. Moderate hiatal hernia. Multiple non pathologically enlarged right paratracheal, prevascular lymph nodes are identified. Subcarinal lymph node is enlarged measuring 1.6 cm. Unchanged from previous exam. No left hilar adenopathy. Right hilar lymph node measures prominent right hilar lymph nodes are identified. Index node measures 0.9 cm, image 68/2 index right hilar lymph node measures 1 cm, image 63/2. Previously 0.9 cm. Lungs/Pleura: No pleural effusion identified. Progressive airspace consolidation and atelectasis within both lower lobes identified right greater than left. Mild changes of paraseptal emphysema. Subsegmental atelectasis and volume loss within the lingula and right middle lobe identified. New subpleural nodular density within the right upper lobe measures 7 mm, image 47/3. Are Upper Abdomen: There is intrahepatic bile duct dilatation. The common bile duct is not visualized in its entirety. Proximally this measures 1.2 cm. Compared with CT of the abdomen from 08/04/2017 this is slightly progressive. Etiology indeterminate. Musculoskeletal: Right lateral eighth and ninth rib fractures are identified and appear unchanged from previous exam. Marked degenerative disc disease noted within the lower cervical spine. A IMPRESSION: 1. A there is progressive worsening aeration to both lung bases with increasing areas of atelectasis and airspace consolidation especially in the right lower lobe. Subsegmental atelectasis and volume loss is also noted within the lingula and right middle lobe which appears slightly progressive from previous exam. Imaging findings are compatible with either recurrent and progressive pneumonia and/or aspiration. Underlying endobronchial lesion can not be excluded. 2. Persistent enlarged subcarinal lymph nodes.  In the setting of recurrent pneumonia these may be reactive etiology. If there is a central obstructing lesion thin this could represent metastatic adenopathy. Consider further evaluation with bronchoscopy. 3. New, nonspecific subpleural nodule within the posterior right upper lobe measuring 7 mm. Non-contrast chest CT at 6-12 months is recommended. If the nodule is stable at time of repeat CT, then future CT at 18-24 months (from today's scan) is considered optional for low-risk patients, but is recommended for high-risk patients. This recommendation follows the consensus statement: Guidelines for Management of Incidental Pulmonary Nodules Detected on CT Images: From the Fleischner Society 2017; Radiology 2017; 284:228-243. 4. Increase intrahepatic bile duct dilatation and mild increase caliber of the common bile duct. The common bile duct is not visualized and its entirety. This is slightly progressive when compared with CT of the abdomen from 08/04/2017. 5. Mild paraseptal emphysema with diffuse bronchial wall thickening. 6. Aortic atherosclerosis and coronary artery atherosclerotic calcifications. Electronically Signed   By: Signa Kell M.D.   On: 01/01/2019 15:25   Dg Chest Port 1 View  Result Date: 01/01/2019 CLINICAL DATA:  Blood infection, GI bleed. EXAM: PORTABLE CHEST 1 VIEW COMPARISON:  12/30/2018 FINDINGS: Cardiac enlargement. Negative for pulmonary edema. Mild patchy bibasilar airspace disease has progressed since the prior study. No significant effusion. IMPRESSION: Progression of mild bibasilar atelectasis/pneumonia. Electronically Signed   By: Marlan Palau M.D.   On: 01/01/2019 11:01     ASSESSMENT AND PLAN:   68 year old female patient with known history of aspiration pneumonia recurrent, congestive heart failure, COPD, DVT, hyperlipidemia, hypertension, anxiety disorder currently under hospitalist service for cough and shortness of breath  -Recurrent aspiration pneumonia Speech  therapy evaluation for swallow study GI consultation for possible endoscopy Pured diet Patient has been noncompliant with diet IV Unasyn antibiotic Diet as per speech therapy Palliative care consult  -Hematemesis  questionable Monitor hemoglobin hematocrit  -COPD with ongoing tobacco abuse Tobacco cessation counseled to the patient for 6 minutes Nicotine patch offered Inhaler therapy to continue  -Chronic systolic heart failure Not in overt failure EF 45% Medical management to continue  All the records are reviewed and case discussed with Care Management/Social Worker. Management plans discussed with the patient, family and they are in agreement.  CODE STATUS: Full code  DVT Prophylaxis: SCDs  TOTAL TIME TAKING CARE OF THIS PATIENT: 37 minutes.   POSSIBLE D/C IN 2 to 3 DAYS, DEPENDING ON CLINICAL CONDITION.  Ihor AustinPavan Dorsie Sethi M.D on 01/02/2019 at 12:29 PM  Between 7am to 6pm - Pager - [phone]  After 6pm go to [email or web address] - password [email or web address] [email or web address]  [facility]  Office  [phone]  CC: Primary care physician; Malva LimesFisher, Donald E, MD  Note: This dictation was prepared with Dragon dictation along with smaller phrase technology. Any transcriptional errors that result from this process are unintentional.

## 2019-01-03 MED ORDER — IPRATROPIUM-ALBUTEROL 0.5-2.5 (3) MG/3ML IN SOLN
3.0000 mL | Freq: Three times a day (TID) | RESPIRATORY_TRACT | Status: DC
  Administered 2019-01-03: 3 mL via RESPIRATORY_TRACT

## 2019-01-03 MED ORDER — AMOXICILLIN-POT CLAVULANATE 875-125 MG PO TABS: 1.0000 | ORAL_TABLET | Freq: Two times a day (BID) | ORAL | 0 refills | Status: AC

## 2019-01-03 NOTE — Discharge Summary (Addendum)
[facility] [place] NAME: Cheryl [name]    MR#:  [id number]  DATE OF BIRTH:  11-06-1951  DATE OF ADMISSION:  01/01/2019 ADMITTING PHYSICIAN: Vaughan Basta, MD  DATE OF DISCHARGE: 01/03/2019  PRIMARY CARE PHYSICIAN: Birdie Sons, MD   ADMISSION DIAGNOSIS:  Upper GI bleed [K92.2] Healthcare-associated pneumonia [J18.9]  DISCHARGE DIAGNOSIS:  Aspiration pneumonia COPD Chronic systolic heart failure Hematemesis self-limited Systemic inflammatory response syndrome secondary to aspiration pneumonia  SECONDARY DIAGNOSIS:   Past Medical History:  Diagnosis Date  . Acute postoperative pain 04/14/2017  . Allergy   . Anemia   . Anxiety   . Arthritis    fingers  . Aspiration pneumonitis ([place]) 11/24/2015  . Asthma   . CHF (congestive heart failure) ([place])   . Chronic pain   . COPD (chronic obstructive pulmonary disease) ([place])   . Coronary artery disease   . DVT (deep venous thrombosis) ([place])   . Dyspnea   . Dysrhythmia   . Encephalopathy    MULTIPLE TIMES  . GERD (gastroesophageal reflux disease)   . Headache   . Hyperlipidemia   . Hypertension   . Kyphoscoliosis deformity of spine   . Migraines   . Neuropathy 2010  . Osteoporosis   . Oxygen deficiency   . Peripheral vascular disease ([place])   . Pneumonia   . Pneumonia 11/19/2015  . Pneumonia 10/2016  . Pneumonia    aspiration 2018- 4 times in last year  . Rib fracture 09/19/2017   Displace right 9th rib fracture.   . Vitamin D deficiency   . Wears dentures    full upper and lower     ADMITTING HISTORY  Cheryl [name]  is a 68 y.o. female with a known history of anxiety, arthritis, aspiration pneumonia, CHF, COPD, DVT, hyperlipidemia, hypertension, neuropathy-had recurrent admissions due to aspiration pneumonia in the last few months.  In her recovery stage she is fully alert and oriented and able to walk with a walker at home.After discharge last week for the same, she  finished her Augmentin for 5 days.  She was doing fine until yesterday.  She again had altered mental status and worsening respiration so brought to emergency room.  As per family she also had vomited few times since yesterday initially it was clear but today last 1 or 2 episodes is converted to dark or black-colored material. As per family she follows all the instructions regarding sitting up with diet, avoiding [place], eating slow-but eats regular food instead of dysphagia 3 diet as advised by swallow technician.Noted to be septic in ER and given to hospitalist team for admission.   HOSPITAL COURSE:  Patient was admitted to medical floor.. Serial hemoglobin was monitored.  Patient was seen by gastroenterology for dysphagia and upper GI bleed.  Patient did not want any intervention such as endoscopy as well as swallow study.  She was evaluated by speech therapy in this hospitalization.  She was also evaluated by speech therapy in the past.  Patient did not want any barium swallow study esophagogram.  She was put back on dysphagia level 3 diet.  Advised patient strictly to follow the diet to avoid aspirations.  Patient received IV Unasyn antibiotic.  Her WBC count improved.  No new episodes of vomiting of blood.  Hemoglobin has been stable.  The hematemesis appears to be self-limited.  Patient has been worked up with CT chest which showed some pulmonary nodules.  Blood cultures no growth, MRSA  PCR was negative, urine culture no growth. outpatient pulmonary follow-up.  Advised family to have strict dietary restrictions.  Patient will be discharged on oral proton pump inhibitor. Diet Level 3 dysphagia diet  CONSULTS OBTAINED:    DRUG ALLERGIES:   Allergies  Allergen Reactions  . Percocet [Oxycodone-Acetaminophen] Hives and Rash  . Aspirin Nausea And Vomiting and Other (See Comments)  . Codeine Nausea And Vomiting, Nausea Only and Other (See Comments)  . Propoxyphene Nausea Only and Other (See  Comments)  . Sulfa Antibiotics Rash and Other (See Comments)    DISCHARGE MEDICATIONS:   Allergies as of 01/03/2019      Reactions   Percocet [oxycodone-acetaminophen] Hives, Rash   Aspirin Nausea And Vomiting, Other (See Comments)   Codeine Nausea And Vomiting, Nausea Only, Other (See Comments)   Propoxyphene Nausea Only, Other (See Comments)   Sulfa Antibiotics Rash, Other (See Comments)      Medication List    STOP taking these medications   famotidine 20 MG tablet Commonly known as:  PEPCID   predniSONE 10 MG tablet Commonly known as:  DELTASONE     TAKE these medications   acetaminophen 325 MG tablet Commonly known as:  TYLENOL Take 2 tablets (650 mg total) by mouth every 6 (six) hours as needed for mild pain (or Fever >/= 101).   albuterol (2.5 MG/3ML) 0.083% nebulizer solution Commonly known as:  PROVENTIL Take 3 mLs (2.5 mg total) by nebulization every 4 (four) hours as needed for wheezing. What changed:  Another medication with the same name was changed. Make sure you understand how and when to take each.   VENTOLIN HFA 108 (90 Base) MCG/ACT inhaler Generic drug:  albuterol Inhale 2 puffs into the lungs every 4 (four) hours as needed for wheezing or shortness of breath. What changed:  See the new instructions.   alendronate 70 MG tablet Commonly known as:  FOSAMAX Take 1 tablet (70 mg total) by mouth once a week. Take with a full glass of water on an empty stomach.   ALPRAZolam 0.5 MG tablet Commonly known as:  XANAX Take 0.5-1 tablets (0.25-0.5 mg total) by mouth at bedtime as needed for anxiety.   amLODipine 5 MG tablet Commonly known as:  NORVASC Take 1 tablet (5 mg total) by mouth daily.   amoxicillin-clavulanate 875-125 MG tablet Commonly known as:  AUGMENTIN Take 1 tablet by mouth 2 (two) times daily for 7 days.   atorvastatin 40 MG tablet Commonly known as:  LIPITOR Take 1 tablet (40 mg total) by mouth daily at 6 PM.   baclofen 10 MG  tablet Commonly known as:  LIORESAL Take 1 tablet (10 mg total) by mouth 3 (three) times daily as needed for muscle spasms.   BELSOMRA 5 MG Tabs Generic drug:  Suvorexant Take 1 tablet by mouth at bedtime as needed (for insomnia). What changed:  See the new instructions.   benzonatate 100 MG capsule Commonly known as:  TESSALON PERLES Take 1 capsule (100 mg total) by mouth 3 (three) times daily as needed for cough.   celecoxib 100 MG capsule Commonly known as:  CELEBREX Take 100 mg by mouth 2 (two) times daily as needed.   clopidogrel 75 MG tablet Commonly known as:  PLAVIX Take 75 mg by mouth daily.   diphenoxylate-atropine 2.5-0.025 MG tablet Commonly known as:  LOMOTIL Take 1 tablet by mouth as needed for diarrhea or loose stools.   estradiol 0.5 MG tablet Commonly known as:  ESTRACE Take  1 tablet (0.5 mg total) by mouth every other day.   fentaNYL 50 MCG/HR Commonly known as:  DURAGESIC Place 1 patch (50 mcg total) onto the skin every 3 (three) days.   fentaNYL 50 MCG/HR Commonly known as:  [place] 1 patch onto the skin every 3 (three) days.   ferrous sulfate 325 (65 FE) MG tablet Take 1 tablet (325 mg total) by mouth 2 (two) times daily with a meal.   Fluticasone-Salmeterol 250-50 MCG/DOSE Aepb Commonly known as:  ADVAIR DISKUS TAKE ONE PUFF TWICE DAILY.   furosemide 20 MG tablet Commonly known as:  LASIX Take 1 tablet (20 mg total) by mouth every other day.   memantine 5 MG tablet Commonly known as:  NAMENDA Take 5 mg by mouth 2 (two) times daily.   NARCAN 4 MG/0.1ML Liqd nasal spray kit Generic drug:  naloxone Place 0.4 mg into the nose daily as needed (for accidental overdose.).   ondansetron 4 MG tablet Commonly known as:  ZOFRAN Take 4 mg by mouth as needed.   pantoprazole 40 MG tablet Commonly known as:  PROTONIX Take 40 mg by mouth 2 (two) times daily. Take 1 tablet (40 mg) by mouth scheduled every morning, may repeat dose in evening  if needed for heartburn/indigestion.   potassium chloride 10 MEQ tablet Commonly known as:  K-DUR,KLOR-CON Take 10 mEq by mouth daily.   pregabalin 300 MG capsule Commonly known as:  LYRICA Take 300 mg by mouth 2 (two) times daily.   silver sulfADIAZINE 1 % cream Commonly known as:  SILVADENE Apply 1 application topically daily as needed (leg infection).   tiotropium 18 MCG inhalation capsule Commonly known as:  SPIRIVA HANDIHALER inhale the contents of one capsule in the handihaler once daily What changed:    how much to take  how to take this  when to take this   triamcinolone 0.025 % ointment Commonly known as:  KENALOG Apply 1 application topically 2 (two) times daily. What changed:    when to take this  reasons to take this   vitamin B-12 1000 MCG tablet Commonly known as:  CYANOCOBALAMIN Take 1 tablet (1,000 mcg total) by mouth daily.       Today  Patient seen and evaluated today No chest pain Tolerating level 3 dysphagia diet well No vomiting of blood Hemodynamically stable Patient will be discharged home with home health services  VITAL SIGNS:  Blood pressure 136/67, pulse 97, temperature 97.9 F (36.6 C), temperature source Oral, resp. rate 16, height '4\' 10"'$  (1.473 m), weight 43.5 kg, SpO2 94 %.  I/O:    Intake/Output Summary (Last 24 hours) at 01/03/2019 1114 Last data filed at 01/03/2019 0900 Gross per 24 hour  Intake 600 ml  Output -  Net 600 ml    PHYSICAL EXAMINATION:  Physical Exam  GENERAL:  68 y.o.-year-old patient lying in the bed with no acute distress.  LUNGS: Normal breath sounds bilaterally, no wheezing, rales,rhonchi or crepitation. No use of accessory muscles of respiration.  CARDIOVASCULAR: S1, S2 normal. No murmurs, rubs, or gallops.  ABDOMEN: Soft, non-tender, non-distended. Bowel sounds present. No organomegaly or mass.  NEUROLOGIC: Moves all 4 extremities. PSYCHIATRIC: The patient is alert and oriented x 3.  SKIN: No  obvious rash, lesion, or ulcer.   DATA REVIEW:   CBC Recent Labs  Lab 01/02/19 0433  WBC 9.3  HGB 9.6*  HCT 31.9*  PLT 208    Chemistries  Recent Labs  Lab 01/01/19 1027 01/02/19  0433  NA 138 141  K 4.5 3.9  CL 101 108  CO2 26 26  GLUCOSE 84 76  BUN 16 11  CREATININE 0.77 0.62  CALCIUM 9.2 7.6*  AST 18  --   ALT 10  --   ALKPHOS 60  --   BILITOT 0.9  --     Cardiac Enzymes No results for input(s): TROPONINI in the last 168 hours.  Microbiology Results  Results for orders placed or performed during the hospital encounter of 01/01/19  Blood Culture (routine x 2)     Status: None (Preliminary result)   Collection Time: 01/01/19 10:28 AM  Result Value Ref Range Status   Specimen Description BLOOD Blood Culture adequate volume  Final   Special Requests   Final    BOTTLES DRAWN AEROBIC AND ANAEROBIC LEFT ANTECUBITAL   Culture   Final    NO GROWTH 2 DAYS Performed at [facility], [address] [place], [place] [address]    Report Status PENDING  Incomplete  Blood Culture (routine x 2)     Status: None (Preliminary result)   Collection Time: 01/01/19 10:29 AM  Result Value Ref Range Status   Specimen Description   Final    BLOOD Blood Culture results may not be optimal due to an inadequate volume of blood received in culture bottles   Special Requests   Final    BOTTLES DRAWN AEROBIC AND ANAEROBIC BLOOD RIGHT HAND   Culture   Final    NO GROWTH 2 DAYS Performed at [facility], [address] [place], [place] [address]    Report Status PENDING  Incomplete  Urine culture     Status: None   Collection Time: 01/01/19 10:29 AM  Result Value Ref Range Status   Specimen Description URINE, RANDOM  Final   Special Requests   Final    NONE Performed at [facility], [address] [place], [place] [address]    Culture NO GROWTH  Final   Report Status 01/02/2019 FINAL  Final  MRSA PCR Screening     Status: None   Collection  Time: 01/01/19  6:20 PM  Result Value Ref Range Status   MRSA by PCR NEGATIVE NEGATIVE Final    Comment:        The GeneXpert MRSA Assay (FDA approved for NASAL specimens only), is one component of a comprehensive MRSA colonization surveillance program. It is not intended to diagnose MRSA infection nor to guide or monitor treatment for MRSA infections. Performed at [facility], [address] [place], [place] [address]     RADIOLOGY:  Ct Chest W Contrast  Result Date: 01/01/2019 CLINICAL DATA:  Unresolved pneumonia. Worsening shortness of breath. EXAM: CT CHEST WITH CONTRAST TECHNIQUE: Multidetector CT imaging of the chest was performed during intravenous contrast administration. CONTRAST:  40m OMNIPAQUE IOHEXOL 300 MG/ML  SOLN COMPARISON:  06/01/2018 FINDINGS: Cardiovascular: Normal heart size. No pericardial effusion. Aortic atherosclerosis. Calcification in the LAD and RCA coronary arteries noted. Mediastinum/Nodes: Normal appearance of the thyroid gland. The trachea appears patent and is midline. Moderate hiatal hernia. Multiple non pathologically enlarged right paratracheal, prevascular lymph nodes are identified. Subcarinal lymph node is enlarged measuring 1.6 cm. Unchanged from previous exam. No left hilar adenopathy. Right hilar lymph node measures prominent right hilar lymph nodes are identified. Index node measures 0.9 cm, image 68/2 index right hilar lymph node measures 1 cm, image 63/2. Previously 0.9 cm. Lungs/Pleura: No pleural effusion identified. Progressive airspace  consolidation and atelectasis within both lower lobes identified right greater than left. Mild changes of paraseptal emphysema. Subsegmental atelectasis and volume loss within the lingula and right middle lobe identified. New subpleural nodular density within the right upper lobe measures 7 mm, image 47/3. Are Upper Abdomen: There is intrahepatic bile duct dilatation. The common bile duct is not visualized  in its entirety. Proximally this measures 1.2 cm. Compared with CT of the abdomen from 08/04/2017 this is slightly progressive. Etiology indeterminate. Musculoskeletal: Right lateral eighth and ninth rib fractures are identified and appear unchanged from previous exam. Marked degenerative disc disease noted within the lower cervical spine. A IMPRESSION: 1. A there is progressive worsening aeration to both lung bases with increasing areas of atelectasis and airspace consolidation especially in the right lower lobe. Subsegmental atelectasis and volume loss is also noted within the lingula and right middle lobe which appears slightly progressive from previous exam. Imaging findings are compatible with either recurrent and progressive pneumonia and/or aspiration. Underlying endobronchial lesion can not be excluded. 2. Persistent enlarged subcarinal lymph nodes. In the setting of recurrent pneumonia these may be reactive etiology. If there is a central obstructing lesion thin this could represent metastatic adenopathy. Consider further evaluation with bronchoscopy. 3. New, nonspecific subpleural nodule within the posterior right upper lobe measuring 7 mm. Non-contrast chest CT at 6-12 months is recommended. If the nodule is stable at time of repeat CT, then future CT at 18-24 months (from today's scan) is considered optional for low-risk patients, but is recommended for high-risk patients. This recommendation follows the consensus statement: Guidelines for Management of Incidental Pulmonary Nodules Detected on CT Images: From the Fleischner Society 2017; Radiology 2017; 284:228-243. 4. Increase intrahepatic bile duct dilatation and mild increase caliber of the common bile duct. The common bile duct is not visualized and its entirety. This is slightly progressive when compared with CT of the abdomen from 08/04/2017. 5. Mild paraseptal emphysema with diffuse bronchial wall thickening. 6. Aortic atherosclerosis and coronary  artery atherosclerotic calcifications. Electronically Signed   By: Kerby Moors M.D.   On: 01/01/2019 15:25    Follow up with PCP in 1 week.  Management plans discussed with the patient, family and they are in agreement.  CODE STATUS: Full code    Code Status Orders  (From admission, onward)         Start     Ordered   01/01/19 1734  Full code  Continuous     01/01/19 1733        Code Status History    Date Active Date Inactive Code Status Order ID Comments User Context   12/21/2018 1827 12/22/2018 1644 Full Code [id number]  Fritzi Mandes, MD ED   11/03/2018 0756 11/08/2018 1557 Full Code [id number]  Max Sane, MD ED   06/18/2018 1428 06/22/2018 1454 Full Code [id number]  Loletha Grayer, MD ED   06/01/2018 1535 06/05/2018 2007 Full Code [id number]  Vaughan Basta, MD Inpatient   05/14/2018 1351 05/21/2018 1558 Partial Code [id number]  Saundra Shelling, MD ED   02/08/2018 1835 02/09/2018 1920 Full Code [id number]  Vaughan Basta, MD Inpatient   02/05/2018 2025 02/07/2018 2119 Full Code [id number]  Hillary Bow, MD ED   04/29/2017 1314 05/02/2017 2148 Full Code [id number]  Epifanio Lesches, MD ED   12/23/2016 1745 12/25/2016 1849 Full Code [id number]  Theodoro Grist, MD ED   11/06/2016 0900 11/07/2016 1853 Full Code [id number]  Max Sane, MD Inpatient   11/05/2016 1401 11/06/2016 0900 Partial Code [id number]  Bettey Costa, MD Inpatient   06/03/2016 0253 06/05/2016 1945 Full Code [id number]  Quintella Baton, MD Inpatient   11/19/2015 1218 11/24/2015 1624 Full Code [id number]  Demetrios Loll, MD Inpatient   11/09/2015 2302 11/13/2015 1851 Full Code [id number]  Lytle Butte, MD ED   10/17/2015 2226 10/22/2015 1303 Full Code [id number]  Fritzi Mandes, MD Inpatient   10/09/2015 1738 10/12/2015 1934 Full Code [id number]  Demetrios Loll, MD Inpatient   08/01/2015 0008 08/03/2015 1454 Full Code [id number]  Lance Coon, MD Inpatient    Advance Directive Documentation     Most Recent Value  Type of Advance  Directive  Healthcare Power of Attorney, Living will  Pre-existing out of facility DNR order (yellow form or pink MOST form)  -  "MOST" Form in Place?  -      TOTAL TIME TAKING CARE OF THIS PATIENT ON DAY OF DISCHARGE: more than 36 minutes.   Saundra Shelling M.D on 01/03/2019 at 11:14 AM  Between 7am to 6pm - Pager - [phone]  After 6pm go to [email or web address] - password [email or web address] [facility]  [employer] [facility] [facility]  Office  [phone]  CC: Primary care physician; Birdie Sons, MD  Note: This dictation was prepared with Dragon dictation along with smaller phrase technology. Any transcriptional errors that result from this process are unintentional.

## 2019-01-03 NOTE — Progress Notes (Signed)
Pt alert but confused. Iv infusing without difficulty. Frequent voiding during the night. Refusing to take night time medication.

## 2019-01-03 NOTE — Progress Notes (Signed)
Discharge instructions reviewed with family and patient.

## 2019-01-03 NOTE — Care Management Note (Signed)
Case Management Note  Patient Details  Name: SANTRICE GRIGNON MRN: [id number] Date of Birth: 1951-01-06  Subjective/Objective: Patient to be discharged per MD order. Orders in place for home health services. [facility] and prefers to continue using them. Referral confirmed with Jermaine of resumption of care. Patient already has all needed DME per her husband. Spouse to transport.                    Action/Plan:   Expected Discharge Date:  01/03/19               Expected Discharge Plan:  Home w Home Health Services  In-House Referral:     Discharge planning Services  CM Consult  Post Acute Care Choice:  Home Health, Resumption of Svcs/PTA Provider Choice offered to:     DME Arranged:    DME Agency:     HH Arranged:  RN, PT, Nurse's Aide HH Agency:  [facility]  Status of Service:  Completed, signed off  If discussed at Long Length of Stay Meetings, dates discussed:    Additional Comments:  Virgel Manifold, RN 01/03/2019, 10:35 AM

## 2019-01-04 ENCOUNTER — Telehealth: Payer: Self-pay

## 2019-01-04 DIAGNOSIS — I10 Essential (primary) hypertension: Secondary | ICD-10-CM | POA: Diagnosis not present

## 2019-01-04 DIAGNOSIS — J449 Chronic obstructive pulmonary disease, unspecified: Secondary | ICD-10-CM | POA: Diagnosis not present

## 2019-01-04 DIAGNOSIS — G8929 Other chronic pain: Secondary | ICD-10-CM | POA: Diagnosis not present

## 2019-01-04 DIAGNOSIS — I2510 Atherosclerotic heart disease of native coronary artery without angina pectoris: Secondary | ICD-10-CM | POA: Diagnosis not present

## 2019-01-04 DIAGNOSIS — D509 Iron deficiency anemia, unspecified: Secondary | ICD-10-CM | POA: Diagnosis not present

## 2019-01-04 DIAGNOSIS — F17210 Nicotine dependence, cigarettes, uncomplicated: Secondary | ICD-10-CM | POA: Diagnosis not present

## 2019-01-04 DIAGNOSIS — I739 Peripheral vascular disease, unspecified: Secondary | ICD-10-CM | POA: Diagnosis not present

## 2019-01-04 DIAGNOSIS — M4180 Other forms of scoliosis, site unspecified: Secondary | ICD-10-CM | POA: Diagnosis not present

## 2019-01-04 DIAGNOSIS — G609 Hereditary and idiopathic neuropathy, unspecified: Secondary | ICD-10-CM | POA: Diagnosis not present

## 2019-01-04 NOTE — Telephone Encounter (Signed)
I have made the 1st attempt to contact the patient or family member in charge, in order to follow up from recently being discharged from the hospital. I left a message on voicemail requesting a CB. -MM 

## 2019-01-05 DIAGNOSIS — J449 Chronic obstructive pulmonary disease, unspecified: Secondary | ICD-10-CM | POA: Diagnosis not present

## 2019-01-05 DIAGNOSIS — I739 Peripheral vascular disease, unspecified: Secondary | ICD-10-CM | POA: Diagnosis not present

## 2019-01-05 DIAGNOSIS — M4180 Other forms of scoliosis, site unspecified: Secondary | ICD-10-CM | POA: Diagnosis not present

## 2019-01-05 DIAGNOSIS — G8929 Other chronic pain: Secondary | ICD-10-CM | POA: Diagnosis not present

## 2019-01-05 DIAGNOSIS — G609 Hereditary and idiopathic neuropathy, unspecified: Secondary | ICD-10-CM | POA: Diagnosis not present

## 2019-01-05 DIAGNOSIS — F17210 Nicotine dependence, cigarettes, uncomplicated: Secondary | ICD-10-CM | POA: Diagnosis not present

## 2019-01-05 DIAGNOSIS — I2510 Atherosclerotic heart disease of native coronary artery without angina pectoris: Secondary | ICD-10-CM | POA: Diagnosis not present

## 2019-01-05 DIAGNOSIS — D509 Iron deficiency anemia, unspecified: Secondary | ICD-10-CM | POA: Diagnosis not present

## 2019-01-05 DIAGNOSIS — I10 Essential (primary) hypertension: Secondary | ICD-10-CM | POA: Diagnosis not present

## 2019-01-06 DIAGNOSIS — I739 Peripheral vascular disease, unspecified: Secondary | ICD-10-CM | POA: Diagnosis not present

## 2019-01-06 DIAGNOSIS — F17210 Nicotine dependence, cigarettes, uncomplicated: Secondary | ICD-10-CM | POA: Diagnosis not present

## 2019-01-06 DIAGNOSIS — I2510 Atherosclerotic heart disease of native coronary artery without angina pectoris: Secondary | ICD-10-CM | POA: Diagnosis not present

## 2019-01-06 DIAGNOSIS — J449 Chronic obstructive pulmonary disease, unspecified: Secondary | ICD-10-CM | POA: Diagnosis not present

## 2019-01-06 DIAGNOSIS — G609 Hereditary and idiopathic neuropathy, unspecified: Secondary | ICD-10-CM | POA: Diagnosis not present

## 2019-01-06 DIAGNOSIS — I10 Essential (primary) hypertension: Secondary | ICD-10-CM | POA: Diagnosis not present

## 2019-01-06 DIAGNOSIS — M4180 Other forms of scoliosis, site unspecified: Secondary | ICD-10-CM | POA: Diagnosis not present

## 2019-01-06 DIAGNOSIS — D509 Iron deficiency anemia, unspecified: Secondary | ICD-10-CM | POA: Diagnosis not present

## 2019-01-06 DIAGNOSIS — G8929 Other chronic pain: Secondary | ICD-10-CM | POA: Diagnosis not present

## 2019-01-06 LAB — CULTURE, BLOOD (ROUTINE X 2)
Culture: NO GROWTH
Culture: NO GROWTH
SPECIMEN DESCRIPTION: ADEQUATE

## 2019-01-06 NOTE — Telephone Encounter (Signed)
I have tried to contact this pt twice and left VMs requesting a CB. Pt has not returned my calls or VMs. A f/u is scheduled for 01/21/19. FYI to PCP. -MM

## 2019-01-07 DIAGNOSIS — I2510 Atherosclerotic heart disease of native coronary artery without angina pectoris: Secondary | ICD-10-CM | POA: Diagnosis not present

## 2019-01-07 DIAGNOSIS — F17210 Nicotine dependence, cigarettes, uncomplicated: Secondary | ICD-10-CM | POA: Diagnosis not present

## 2019-01-07 DIAGNOSIS — D509 Iron deficiency anemia, unspecified: Secondary | ICD-10-CM | POA: Diagnosis not present

## 2019-01-07 DIAGNOSIS — J449 Chronic obstructive pulmonary disease, unspecified: Secondary | ICD-10-CM | POA: Diagnosis not present

## 2019-01-07 DIAGNOSIS — I10 Essential (primary) hypertension: Secondary | ICD-10-CM | POA: Diagnosis not present

## 2019-01-07 DIAGNOSIS — I739 Peripheral vascular disease, unspecified: Secondary | ICD-10-CM | POA: Diagnosis not present

## 2019-01-07 DIAGNOSIS — M4180 Other forms of scoliosis, site unspecified: Secondary | ICD-10-CM | POA: Diagnosis not present

## 2019-01-07 DIAGNOSIS — G8929 Other chronic pain: Secondary | ICD-10-CM | POA: Diagnosis not present

## 2019-01-07 DIAGNOSIS — G609 Hereditary and idiopathic neuropathy, unspecified: Secondary | ICD-10-CM | POA: Diagnosis not present

## 2019-01-08 DIAGNOSIS — F17210 Nicotine dependence, cigarettes, uncomplicated: Secondary | ICD-10-CM | POA: Diagnosis not present

## 2019-01-08 DIAGNOSIS — D509 Iron deficiency anemia, unspecified: Secondary | ICD-10-CM | POA: Diagnosis not present

## 2019-01-08 DIAGNOSIS — G609 Hereditary and idiopathic neuropathy, unspecified: Secondary | ICD-10-CM | POA: Diagnosis not present

## 2019-01-08 DIAGNOSIS — G8929 Other chronic pain: Secondary | ICD-10-CM | POA: Diagnosis not present

## 2019-01-08 DIAGNOSIS — M4180 Other forms of scoliosis, site unspecified: Secondary | ICD-10-CM | POA: Diagnosis not present

## 2019-01-08 DIAGNOSIS — I10 Essential (primary) hypertension: Secondary | ICD-10-CM | POA: Diagnosis not present

## 2019-01-08 DIAGNOSIS — I2510 Atherosclerotic heart disease of native coronary artery without angina pectoris: Secondary | ICD-10-CM | POA: Diagnosis not present

## 2019-01-08 DIAGNOSIS — I739 Peripheral vascular disease, unspecified: Secondary | ICD-10-CM | POA: Diagnosis not present

## 2019-01-08 DIAGNOSIS — J449 Chronic obstructive pulmonary disease, unspecified: Secondary | ICD-10-CM | POA: Diagnosis not present

## 2019-01-11 ENCOUNTER — Other Ambulatory Visit: Payer: Self-pay | Admitting: Family Medicine

## 2019-01-11 DIAGNOSIS — G8929 Other chronic pain: Secondary | ICD-10-CM | POA: Diagnosis not present

## 2019-01-11 DIAGNOSIS — J449 Chronic obstructive pulmonary disease, unspecified: Secondary | ICD-10-CM | POA: Diagnosis not present

## 2019-01-11 DIAGNOSIS — I10 Essential (primary) hypertension: Secondary | ICD-10-CM | POA: Diagnosis not present

## 2019-01-11 DIAGNOSIS — I739 Peripheral vascular disease, unspecified: Secondary | ICD-10-CM | POA: Diagnosis not present

## 2019-01-11 DIAGNOSIS — M4180 Other forms of scoliosis, site unspecified: Secondary | ICD-10-CM | POA: Diagnosis not present

## 2019-01-11 DIAGNOSIS — F17210 Nicotine dependence, cigarettes, uncomplicated: Secondary | ICD-10-CM | POA: Diagnosis not present

## 2019-01-11 DIAGNOSIS — I2510 Atherosclerotic heart disease of native coronary artery without angina pectoris: Secondary | ICD-10-CM | POA: Diagnosis not present

## 2019-01-11 DIAGNOSIS — G609 Hereditary and idiopathic neuropathy, unspecified: Secondary | ICD-10-CM | POA: Diagnosis not present

## 2019-01-11 DIAGNOSIS — D509 Iron deficiency anemia, unspecified: Secondary | ICD-10-CM | POA: Diagnosis not present

## 2019-01-12 DIAGNOSIS — G609 Hereditary and idiopathic neuropathy, unspecified: Secondary | ICD-10-CM | POA: Diagnosis not present

## 2019-01-12 DIAGNOSIS — F419 Anxiety disorder, unspecified: Secondary | ICD-10-CM | POA: Diagnosis not present

## 2019-01-12 DIAGNOSIS — G8929 Other chronic pain: Secondary | ICD-10-CM | POA: Diagnosis not present

## 2019-01-12 DIAGNOSIS — G43019 Migraine without aura, intractable, without status migrainosus: Secondary | ICD-10-CM | POA: Diagnosis not present

## 2019-01-12 DIAGNOSIS — D509 Iron deficiency anemia, unspecified: Secondary | ICD-10-CM | POA: Diagnosis not present

## 2019-01-12 DIAGNOSIS — I739 Peripheral vascular disease, unspecified: Secondary | ICD-10-CM | POA: Diagnosis not present

## 2019-01-12 DIAGNOSIS — J439 Emphysema, unspecified: Secondary | ICD-10-CM | POA: Diagnosis not present

## 2019-01-12 DIAGNOSIS — M4180 Other forms of scoliosis, site unspecified: Secondary | ICD-10-CM | POA: Diagnosis not present

## 2019-01-12 DIAGNOSIS — K219 Gastro-esophageal reflux disease without esophagitis: Secondary | ICD-10-CM | POA: Diagnosis not present

## 2019-01-12 DIAGNOSIS — J449 Chronic obstructive pulmonary disease, unspecified: Secondary | ICD-10-CM | POA: Diagnosis not present

## 2019-01-12 DIAGNOSIS — I2510 Atherosclerotic heart disease of native coronary artery without angina pectoris: Secondary | ICD-10-CM | POA: Diagnosis not present

## 2019-01-12 DIAGNOSIS — Z8701 Personal history of pneumonia (recurrent): Secondary | ICD-10-CM | POA: Diagnosis not present

## 2019-01-12 DIAGNOSIS — I10 Essential (primary) hypertension: Secondary | ICD-10-CM | POA: Diagnosis not present

## 2019-01-12 DIAGNOSIS — R1312 Dysphagia, oropharyngeal phase: Secondary | ICD-10-CM | POA: Diagnosis not present

## 2019-01-12 DIAGNOSIS — F17210 Nicotine dependence, cigarettes, uncomplicated: Secondary | ICD-10-CM | POA: Diagnosis not present

## 2019-01-13 DIAGNOSIS — J449 Chronic obstructive pulmonary disease, unspecified: Secondary | ICD-10-CM | POA: Diagnosis not present

## 2019-01-13 DIAGNOSIS — G8929 Other chronic pain: Secondary | ICD-10-CM | POA: Diagnosis not present

## 2019-01-13 DIAGNOSIS — I2510 Atherosclerotic heart disease of native coronary artery without angina pectoris: Secondary | ICD-10-CM | POA: Diagnosis not present

## 2019-01-13 DIAGNOSIS — F17210 Nicotine dependence, cigarettes, uncomplicated: Secondary | ICD-10-CM | POA: Diagnosis not present

## 2019-01-13 DIAGNOSIS — D509 Iron deficiency anemia, unspecified: Secondary | ICD-10-CM | POA: Diagnosis not present

## 2019-01-13 DIAGNOSIS — G609 Hereditary and idiopathic neuropathy, unspecified: Secondary | ICD-10-CM | POA: Diagnosis not present

## 2019-01-13 DIAGNOSIS — I10 Essential (primary) hypertension: Secondary | ICD-10-CM | POA: Diagnosis not present

## 2019-01-13 DIAGNOSIS — M4180 Other forms of scoliosis, site unspecified: Secondary | ICD-10-CM | POA: Diagnosis not present

## 2019-01-13 DIAGNOSIS — I739 Peripheral vascular disease, unspecified: Secondary | ICD-10-CM | POA: Diagnosis not present

## 2019-01-14 DIAGNOSIS — J449 Chronic obstructive pulmonary disease, unspecified: Secondary | ICD-10-CM | POA: Diagnosis not present

## 2019-01-15 ENCOUNTER — Emergency Department: Payer: Medicare HMO

## 2019-01-15 ENCOUNTER — Inpatient Hospital Stay: Payer: Medicare HMO

## 2019-01-15 ENCOUNTER — Inpatient Hospital Stay
Admission: EM | Admit: 2019-01-15 | Discharge: 2019-01-20 | DRG: 871 | Disposition: A | Discharge status: Home Health | Payer: Medicare HMO | Attending: Internal Medicine | Admitting: Internal Medicine

## 2019-01-15 ENCOUNTER — Encounter: Payer: Self-pay | Admitting: Emergency Medicine

## 2019-01-15 DIAGNOSIS — Z79891 Long term (current) use of opiate analgesic: Secondary | ICD-10-CM

## 2019-01-15 DIAGNOSIS — J440 Chronic obstructive pulmonary disease with acute lower respiratory infection: Secondary | ICD-10-CM | POA: Diagnosis present

## 2019-01-15 DIAGNOSIS — Z981 Arthrodesis status: Secondary | ICD-10-CM

## 2019-01-15 DIAGNOSIS — I5042 Chronic combined systolic (congestive) and diastolic (congestive) heart failure: Secondary | ICD-10-CM | POA: Diagnosis not present

## 2019-01-15 DIAGNOSIS — A419 Sepsis, unspecified organism: Secondary | ICD-10-CM | POA: Diagnosis not present

## 2019-01-15 DIAGNOSIS — R4020 Unspecified coma: Secondary | ICD-10-CM | POA: Diagnosis not present

## 2019-01-15 DIAGNOSIS — I2510 Atherosclerotic heart disease of native coronary artery without angina pectoris: Secondary | ICD-10-CM | POA: Diagnosis present

## 2019-01-15 DIAGNOSIS — R404 Transient alteration of awareness: Secondary | ICD-10-CM | POA: Diagnosis not present

## 2019-01-15 DIAGNOSIS — R0689 Other abnormalities of breathing: Secondary | ICD-10-CM | POA: Diagnosis not present

## 2019-01-15 DIAGNOSIS — Z515 Encounter for palliative care: Secondary | ICD-10-CM | POA: Diagnosis not present

## 2019-01-15 DIAGNOSIS — J9621 Acute and chronic respiratory failure with hypoxia: Secondary | ICD-10-CM | POA: Diagnosis present

## 2019-01-15 DIAGNOSIS — J189 Pneumonia, unspecified organism: Secondary | ICD-10-CM

## 2019-01-15 DIAGNOSIS — Z9911 Dependence on respirator [ventilator] status: Secondary | ICD-10-CM

## 2019-01-15 DIAGNOSIS — E785 Hyperlipidemia, unspecified: Secondary | ICD-10-CM | POA: Diagnosis present

## 2019-01-15 DIAGNOSIS — J9601 Acute respiratory failure with hypoxia: Secondary | ICD-10-CM | POA: Diagnosis not present

## 2019-01-15 DIAGNOSIS — J441 Chronic obstructive pulmonary disease with (acute) exacerbation: Secondary | ICD-10-CM | POA: Diagnosis not present

## 2019-01-15 DIAGNOSIS — G894 Chronic pain syndrome: Secondary | ICD-10-CM | POA: Diagnosis present

## 2019-01-15 DIAGNOSIS — Z4682 Encounter for fitting and adjustment of non-vascular catheter: Secondary | ICD-10-CM | POA: Diagnosis not present

## 2019-01-15 DIAGNOSIS — J14 Pneumonia due to Hemophilus influenzae: Secondary | ICD-10-CM | POA: Diagnosis present

## 2019-01-15 DIAGNOSIS — Y95 Nosocomial condition: Secondary | ICD-10-CM | POA: Diagnosis present

## 2019-01-15 DIAGNOSIS — I110 Hypertensive heart disease with heart failure: Secondary | ICD-10-CM | POA: Diagnosis present

## 2019-01-15 DIAGNOSIS — Z8249 Family history of ischemic heart disease and other diseases of the circulatory system: Secondary | ICD-10-CM

## 2019-01-15 DIAGNOSIS — F17210 Nicotine dependence, cigarettes, uncomplicated: Secondary | ICD-10-CM | POA: Diagnosis present

## 2019-01-15 DIAGNOSIS — J150 Pneumonia due to Klebsiella pneumoniae: Secondary | ICD-10-CM | POA: Diagnosis present

## 2019-01-15 DIAGNOSIS — Z833 Family history of diabetes mellitus: Secondary | ICD-10-CM | POA: Diagnosis not present

## 2019-01-15 DIAGNOSIS — Z7951 Long term (current) use of inhaled steroids: Secondary | ICD-10-CM

## 2019-01-15 DIAGNOSIS — I248 Other forms of acute ischemic heart disease: Secondary | ICD-10-CM | POA: Diagnosis not present

## 2019-01-15 DIAGNOSIS — R6521 Severe sepsis with septic shock: Secondary | ICD-10-CM | POA: Diagnosis not present

## 2019-01-15 DIAGNOSIS — Z7983 Long term (current) use of bisphosphonates: Secondary | ICD-10-CM

## 2019-01-15 DIAGNOSIS — Z7902 Long term (current) use of antithrombotics/antiplatelets: Secondary | ICD-10-CM

## 2019-01-15 DIAGNOSIS — R6520 Severe sepsis without septic shock: Secondary | ICD-10-CM | POA: Diagnosis present

## 2019-01-15 DIAGNOSIS — Z8701 Personal history of pneumonia (recurrent): Secondary | ICD-10-CM | POA: Diagnosis not present

## 2019-01-15 DIAGNOSIS — R000 Tachycardia, unspecified: Secondary | ICD-10-CM | POA: Diagnosis not present

## 2019-01-15 DIAGNOSIS — G931 Anoxic brain damage, not elsewhere classified: Secondary | ICD-10-CM | POA: Diagnosis present

## 2019-01-15 DIAGNOSIS — J449 Chronic obstructive pulmonary disease, unspecified: Secondary | ICD-10-CM | POA: Diagnosis not present

## 2019-01-15 DIAGNOSIS — R29898 Other symptoms and signs involving the musculoskeletal system: Secondary | ICD-10-CM | POA: Diagnosis not present

## 2019-01-15 DIAGNOSIS — Z7189 Other specified counseling: Secondary | ICD-10-CM | POA: Diagnosis not present

## 2019-01-15 DIAGNOSIS — N179 Acute kidney failure, unspecified: Secondary | ICD-10-CM | POA: Diagnosis present

## 2019-01-15 DIAGNOSIS — Z9981 Dependence on supplemental oxygen: Secondary | ICD-10-CM | POA: Diagnosis not present

## 2019-01-15 DIAGNOSIS — R1310 Dysphagia, unspecified: Secondary | ICD-10-CM | POA: Diagnosis not present

## 2019-01-15 DIAGNOSIS — J9600 Acute respiratory failure, unspecified whether with hypoxia or hypercapnia: Secondary | ICD-10-CM | POA: Diagnosis not present

## 2019-01-15 DIAGNOSIS — G9340 Encephalopathy, unspecified: Secondary | ICD-10-CM | POA: Diagnosis not present

## 2019-01-15 DIAGNOSIS — R0902 Hypoxemia: Secondary | ICD-10-CM | POA: Diagnosis not present

## 2019-01-15 DIAGNOSIS — I509 Heart failure, unspecified: Secondary | ICD-10-CM | POA: Diagnosis not present

## 2019-01-15 DIAGNOSIS — F17200 Nicotine dependence, unspecified, uncomplicated: Secondary | ICD-10-CM | POA: Diagnosis not present

## 2019-01-15 DIAGNOSIS — Z79890 Hormone replacement therapy (postmenopausal): Secondary | ICD-10-CM

## 2019-01-15 DIAGNOSIS — E1165 Type 2 diabetes mellitus with hyperglycemia: Secondary | ICD-10-CM | POA: Diagnosis not present

## 2019-01-15 LAB — COMPREHENSIVE METABOLIC PANEL
ALT: 15 U/L (ref 0–44)
AST: 25 U/L (ref 15–41)
Albumin: 3.4 g/dL — ABNORMAL LOW (ref 3.5–5.0)
Alkaline Phosphatase: 78 U/L (ref 38–126)
Anion gap: 10 (ref 5–15)
BILIRUBIN TOTAL: 0.8 mg/dL (ref 0.3–1.2)
BUN: 25 mg/dL — ABNORMAL HIGH (ref 8–23)
CO2: 29 mmol/L (ref 22–32)
CREATININE: 1.13 mg/dL — AB (ref 0.44–1.00)
Calcium: 8.8 mg/dL — ABNORMAL LOW (ref 8.9–10.3)
Chloride: 99 mmol/L (ref 98–111)
GFR calc Af Amer: 57 mL/min — ABNORMAL LOW (ref >60)
GFR calc non Af Amer: 50 mL/min — ABNORMAL LOW (ref >60)
Glucose, Bld: 106 mg/dL — ABNORMAL HIGH (ref 70–99)
Potassium: 4.5 mmol/L (ref 3.5–5.1)
Sodium: 138 mmol/L (ref 135–145)
Total Protein: 6.6 g/dL (ref 6.5–8.1)

## 2019-01-15 LAB — BLOOD GAS, ARTERIAL
Acid-Base Excess: 8.3 mmol/L — ABNORMAL HIGH (ref 0.0–2.0)
Bicarbonate: 31.8 mmol/L — ABNORMAL HIGH (ref 20.0–28.0)
FIO2: 60
MECHVT: 450 mL
O2 Saturation: 91.9 %
PATIENT TEMPERATURE: 40
PCO2 ART: 39 mmHg (ref 32.0–48.0)
PEEP: 8 cmH2O
RATE: 16 resp/min
pH, Arterial: 7.47 — ABNORMAL HIGH (ref 7.350–7.450)
pO2, Arterial: 69 mmHg — ABNORMAL LOW (ref 83.0–108.0)

## 2019-01-15 LAB — CBC WITH DIFFERENTIAL/PLATELET
Abs Immature Granulocytes: 0.07 10*3/uL (ref 0.00–0.07)
Basophils Absolute: 0.1 10*3/uL (ref 0.0–0.1)
Basophils Relative: 0 %
Eosinophils Absolute: 0.2 10*3/uL (ref 0.0–0.5)
Eosinophils Relative: 2 %
HEMATOCRIT: 39.4 % (ref 36.0–46.0)
HEMOGLOBIN: 12.1 g/dL (ref 12.0–15.0)
Immature Granulocytes: 1 %
LYMPHS ABS: 0.6 10*3/uL — AB (ref 0.7–4.0)
Lymphocytes Relative: 4 %
MCH: 30.6 pg (ref 26.0–34.0)
MCHC: 30.7 g/dL (ref 30.0–36.0)
MCV: 99.5 fL (ref 80.0–100.0)
Monocytes Absolute: 0.8 10*3/uL (ref 0.1–1.0)
Monocytes Relative: 6 %
Neutro Abs: 11.2 10*3/uL — ABNORMAL HIGH (ref 1.7–7.7)
Neutrophils Relative %: 87 %
Platelets: 230 10*3/uL (ref 150–400)
RBC: 3.96 MIL/uL (ref 3.87–5.11)
RDW: 14.3 % (ref 11.5–15.5)
WBC: 12.9 10*3/uL — ABNORMAL HIGH (ref 4.0–10.5)
nRBC: 0 % (ref 0.0–0.2)

## 2019-01-15 LAB — LACTIC ACID, PLASMA: Lactic Acid, Venous: 2.6 mmol/L (ref 0.5–1.9)

## 2019-01-15 LAB — INFLUENZA PANEL BY PCR (TYPE A & B)
Influenza A By PCR: NEGATIVE
Influenza B By PCR: NEGATIVE

## 2019-01-15 LAB — PROCALCITONIN: Procalcitonin: 15.14 ng/mL

## 2019-01-15 LAB — URINALYSIS, ROUTINE W REFLEX MICROSCOPIC
Bilirubin Urine: NEGATIVE
GLUCOSE, UA: NEGATIVE mg/dL
Ketones, ur: NEGATIVE mg/dL
LEUKOCYTE UA: NEGATIVE
NITRITE: NEGATIVE
Protein, ur: NEGATIVE mg/dL
Specific Gravity, Urine: 1.011 (ref 1.005–1.030)
Squamous Epithelial / HPF: NONE SEEN (ref 0–5)
pH: 5 (ref 5.0–8.0)

## 2019-01-15 LAB — PROTIME-INR
INR: 1.14
Prothrombin Time: 14.5 seconds (ref 11.4–15.2)

## 2019-01-15 LAB — TROPONIN I
Troponin I: 0.03 ng/mL (ref <0.03)
Troponin I: <0.03 ng/mL (ref <0.03)

## 2019-01-15 LAB — TRIGLYCERIDES: Triglycerides: 82 mg/dL (ref <150)

## 2019-01-15 LAB — BRAIN NATRIURETIC PEPTIDE: B Natriuretic Peptide: 81 pg/mL (ref 0.0–100.0)

## 2019-01-15 LAB — LIPASE, BLOOD: Lipase: 44 U/L (ref 11–51)

## 2019-01-15 MED ORDER — LACTATED RINGERS IV SOLN
INTRAVENOUS | Status: DC
  Administered 2019-01-15 – 2019-01-19 (×5): via INTRAVENOUS

## 2019-01-15 MED ORDER — VANCOMYCIN HCL IN DEXTROSE 1-5 GM/200ML-% IV SOLN
1000.0000 mg | Freq: Once | INTRAVENOUS | Status: AC
  Administered 2019-01-15: 1000 mg via INTRAVENOUS
  Filled 2019-01-15: qty 200

## 2019-01-15 MED ORDER — SODIUM CHLORIDE 0.9 % IV BOLUS
500.0000 mL | Freq: Once | INTRAVENOUS | Status: AC
  Administered 2019-01-15: 500 mL via INTRAVENOUS

## 2019-01-15 MED ORDER — ROCURONIUM BROMIDE 50 MG/5ML IV SOLN
60.0000 mg | Freq: Once | INTRAVENOUS | Status: AC
  Administered 2019-01-15: 60 mg via INTRAVENOUS
  Filled 2019-01-15: qty 6

## 2019-01-15 MED ORDER — SODIUM CHLORIDE 0.9 % IV BOLUS
1000.0000 mL | Freq: Once | INTRAVENOUS | Status: AC
  Administered 2019-01-15: 1000 mL via INTRAVENOUS

## 2019-01-15 MED ORDER — ACETAMINOPHEN 650 MG RE SUPP: 650.0000 mg | Freq: Four times a day (QID) | RECTAL | Status: DC | PRN

## 2019-01-15 MED ORDER — ALBUTEROL SULFATE (2.5 MG/3ML) 0.083% IN NEBU: 2.5000 mg | INHALATION_SOLUTION | RESPIRATORY_TRACT | Status: DC | PRN

## 2019-01-15 MED ORDER — IPRATROPIUM-ALBUTEROL 0.5-2.5 (3) MG/3ML IN SOLN
3.0000 mL | Freq: Once | RESPIRATORY_TRACT | Status: AC
  Administered 2019-01-15: 3 mL via RESPIRATORY_TRACT
  Filled 2019-01-15: qty 3

## 2019-01-15 MED ORDER — ONDANSETRON HCL 4 MG PO TABS: 4.0000 mg | ORAL_TABLET | Freq: Four times a day (QID) | ORAL | Status: DC | PRN

## 2019-01-15 MED ORDER — METRONIDAZOLE IN NACL 5-0.79 MG/ML-% IV SOLN
500.0000 mg | Freq: Three times a day (TID) | INTRAVENOUS | Status: DC
  Administered 2019-01-15: 500 mg via INTRAVENOUS
  Filled 2019-01-15 (×4): qty 100

## 2019-01-15 MED ORDER — SODIUM CHLORIDE 0.9 % IV SOLN
INTRAVENOUS | Status: DC
  Administered 2019-01-15: 18:00:00 via INTRAVENOUS

## 2019-01-15 MED ORDER — IPRATROPIUM-ALBUTEROL 0.5-2.5 (3) MG/3ML IN SOLN
3.0000 mL | Freq: Four times a day (QID) | RESPIRATORY_TRACT | Status: DC
  Administered 2019-01-15 – 2019-01-19 (×14): 3 mL via RESPIRATORY_TRACT
  Filled 2019-01-15 (×17): qty 3

## 2019-01-15 MED ORDER — FAMOTIDINE IN NACL 20-0.9 MG/50ML-% IV SOLN
20.0000 mg | INTRAVENOUS | Status: DC
  Administered 2019-01-15: 20 mg via INTRAVENOUS
  Filled 2019-01-15: qty 50

## 2019-01-15 MED ORDER — SODIUM CHLORIDE 0.9 % IV SOLN
2.0000 g | Freq: Once | INTRAVENOUS | Status: AC
  Administered 2019-01-15: 2 g via INTRAVENOUS
  Filled 2019-01-15: qty 2

## 2019-01-15 MED ORDER — ENOXAPARIN SODIUM 40 MG/0.4ML ~~LOC~~ SOLN
30.0000 mg | SUBCUTANEOUS | Status: DC
  Administered 2019-01-15: 30 mg via SUBCUTANEOUS
  Filled 2019-01-15: qty 0.4

## 2019-01-15 MED ORDER — FENTANYL CITRATE (PF) 100 MCG/2ML IJ SOLN
50.0000 ug | INTRAMUSCULAR | Status: DC | PRN
  Filled 2019-01-15: qty 2

## 2019-01-15 MED ORDER — METHYLPREDNISOLONE SODIUM SUCC 40 MG IJ SOLR: 40.0000 mg | Freq: Three times a day (TID) | INTRAMUSCULAR | Status: DC

## 2019-01-15 MED ORDER — FENTANYL 2500MCG IN NS 250ML (10MCG/ML) PREMIX INFUSION
0.0000 ug/h | INTRAVENOUS | Status: DC
  Administered 2019-01-15 (×2): 50 ug/h via INTRAVENOUS
  Filled 2019-01-15: qty 250

## 2019-01-15 MED ORDER — ONDANSETRON HCL 4 MG/2ML IJ SOLN
4.0000 mg | Freq: Four times a day (QID) | INTRAMUSCULAR | Status: DC | PRN
  Administered 2019-01-17 – 2019-01-18 (×2): 4 mg via INTRAVENOUS
  Filled 2019-01-15: qty 2

## 2019-01-15 MED ORDER — POLYETHYLENE GLYCOL 3350 17 G PO PACK: 17.0000 g | PACK | Freq: Every day | ORAL | Status: DC | PRN

## 2019-01-15 MED ORDER — SODIUM CHLORIDE 0.9 % IV SOLN
0.5000 mg/h | INTRAVENOUS | Status: DC
  Administered 2019-01-15: 0.5 mg/h via INTRAVENOUS
  Filled 2019-01-15: qty 10

## 2019-01-15 MED ORDER — SODIUM CHLORIDE 0.9 % IV SOLN
2.0000 g | Freq: Two times a day (BID) | INTRAVENOUS | Status: DC
  Administered 2019-01-16 – 2019-01-20 (×10): 2 g via INTRAVENOUS
  Filled 2019-01-15 (×14): qty 2

## 2019-01-15 MED ORDER — METHYLPREDNISOLONE SODIUM SUCC 40 MG IJ SOLR
40.0000 mg | Freq: Two times a day (BID) | INTRAMUSCULAR | Status: DC
  Administered 2019-01-15 – 2019-01-17 (×4): 40 mg via INTRAVENOUS
  Filled 2019-01-15 (×4): qty 1

## 2019-01-15 MED ORDER — SUCCINYLCHOLINE CHLORIDE 20 MG/ML IJ SOLN: 60.0000 mg | Freq: Once | INTRAMUSCULAR | Status: DC

## 2019-01-15 MED ORDER — ACETAMINOPHEN 10 MG/ML IV SOLN
1000.0000 mg | Freq: Four times a day (QID) | INTRAVENOUS | Status: DC
  Administered 2019-01-15 (×2): 1000 mg via INTRAVENOUS
  Filled 2019-01-15 (×4): qty 100

## 2019-01-15 MED ORDER — MIDAZOLAM 50MG/50ML (1MG/ML) PREMIX INFUSION
0.5000 mg/h | INTRAVENOUS | Status: DC
  Filled 2019-01-15: qty 50

## 2019-01-15 MED ORDER — FENTANYL CITRATE (PF) 100 MCG/2ML IJ SOLN
50.0000 ug | INTRAMUSCULAR | Status: DC | PRN
  Administered 2019-01-15 – 2019-01-16 (×2): 50 ug via INTRAVENOUS
  Filled 2019-01-15: qty 2

## 2019-01-15 MED ORDER — METHYLPREDNISOLONE SODIUM SUCC 125 MG IJ SOLR
125.0000 mg | INTRAMUSCULAR | Status: AC
  Administered 2019-01-15: 125 mg via INTRAVENOUS
  Filled 2019-01-15: qty 2

## 2019-01-15 MED ORDER — ACETAMINOPHEN 325 MG PO TABS: 650.0000 mg | ORAL_TABLET | Freq: Four times a day (QID) | ORAL | Status: DC | PRN

## 2019-01-15 MED ORDER — PROPOFOL 1000 MG/100ML IV EMUL
0.0000 ug/kg/min | INTRAVENOUS | Status: DC
  Administered 2019-01-15: 10 ug/kg/min via INTRAVENOUS
  Filled 2019-01-15: qty 100

## 2019-01-15 MED ORDER — ETOMIDATE 2 MG/ML IV SOLN
15.0000 mg | Freq: Once | INTRAVENOUS | Status: AC
  Administered 2019-01-15: 15 mg via INTRAVENOUS

## 2019-01-15 NOTE — ED Triage Notes (Signed)
PT to ER via EMS from home with c/o severe respiratory distress and altered mental status.  Pt reports pt last seen normal was last night around 6pm and that she was altered this AM, but they did not call EMS until 1 hour PTA.  Pt arrives with NRB, bilateral nasal trumpets, responsive to pain only.  Reported temp per EMS 100.7.  Dr. Fanny Bien at bedside, proceeding with intubation at this time.

## 2019-01-15 NOTE — Consult Note (Signed)
CODE SEPSIS - PHARMACY COMMUNICATION  **Broad Spectrum Antibiotics should be administered within 1 hour of Sepsis diagnosis**  Time Code Sepsis Called/Page Received: 1400  Antibiotics Ordered: cefepime and vancomycin  Time of 1st antibiotic administration: 1438  Additional action taken by pharmacy: none required  If necessary, Name of Provider/Nurse Contacted: N/A    Lowella Bandy ,PharmD Clinical Pharmacist  01/15/2019  3:02 PM

## 2019-01-15 NOTE — ED Notes (Signed)
Dr. Fanny Bien to room on pt arrival, decision made to intubate pt.  Consulting civil engineer and RT present at this time.

## 2019-01-15 NOTE — Consult Note (Signed)
Pharmacy Antibiotic Note  Cheryl [name] is a 68 y.o. female admitted on 01/15/2019 with sepsis.  Pharmacy has been consulted for cefepime dosing. She has several recent admissions here at [facility] but has not received vancomycin long enough for levels to have been drawn. The most recent admission was approximately 2 weeks ago for PNA. CXR today shows possible PNA.  Plan:  Start cefepime 2 grams IV every 12 hours  Weight: 119 lb 7.8 oz (54.2 kg)  Temp (24hrs), Avg:102 F (38.9 C), Min:102 F (38.9 C), Max:102 F (38.9 C)  No results for input(s): WBC, CREATININE, LATICACIDVEN, VANCOTROUGH, VANCOPEAK, VANCORANDOM, GENTTROUGH, GENTPEAK, GENTRANDOM, TOBRATROUGH, TOBRAPEAK, TOBRARND, AMIKACINPEAK, AMIKACINTROU, AMIKACIN in the last 168 hours.  Estimated Creatinine Clearance: 48.4 mL/min (by C-G formula based on SCr of 0.62 mg/dL).    Allergies  Allergen Reactions  . Percocet [Oxycodone-Acetaminophen] Hives and Rash  . Aspirin Nausea And Vomiting and Other (See Comments)  . Codeine Nausea And Vomiting, Nausea Only and Other (See Comments)  . Propoxyphene Nausea Only and Other (See Comments)  . Sulfa Antibiotics Rash and Other (See Comments)    Antimicrobials this admission: vancomycin 2/21 x1  cefepime 2/21 >>  metronidazole 2/21 >>  Microbiology results: 2/21 BCx: pending 2/21 influenza: negative 2/21 UCx: pending   Thank you for allowing pharmacy to be a part of this patient's care.  Lowella Bandy, PharmD 01/15/2019 2:00 PM

## 2019-01-15 NOTE — ED Provider Notes (Addendum)
[facility] Department of Emergency Medicine    Chief Complaint: unresponsive  Level V Caveat: Unresponsive  History of present illness: Patient unresponsive.  Found unresponsive today at home.  EMS reports hypoxia, fever, patient required bag valve masking and nonrebreather to maintain oxygen saturations.  Crackles throughout.  ROS: Unable to obtain, Level V caveat    PRN Meds:. Past Medical History:  Diagnosis Date  . Acute postoperative pain 04/14/2017  . Allergy   . Anemia   . Anxiety   . Arthritis    fingers  . Aspiration pneumonitis (HCC) 11/24/2015  . Asthma   . CHF (congestive heart failure) (HCC)   . Chronic pain   . COPD (chronic obstructive pulmonary disease) (HCC)   . Coronary artery disease   . DVT (deep venous thrombosis) (HCC)   . Dyspnea   . Dysrhythmia   . Encephalopathy    MULTIPLE TIMES  . GERD (gastroesophageal reflux disease)   . Headache   . Hyperlipidemia   . Hypertension   . Kyphoscoliosis deformity of spine   . Migraines   . Neuropathy 2010  . Osteoporosis   . Oxygen deficiency   . Peripheral vascular disease (HCC)   . Pneumonia   . Pneumonia 11/19/2015  . Pneumonia 10/2016  . Pneumonia    aspiration 2018- 4 times in last year  . Rib fracture 09/19/2017   Displace right 9th rib fracture.   . Vitamin D deficiency   . Wears dentures    full upper and lower   Past Surgical History:  Procedure Laterality Date  . ABDOMINAL HYSTERECTOMY  1975   Bilaterl Oophorectomy; [place] to IUD infection  . abdomnal aortic stent  05/30/2008   Dr. Nanetta BattyJonathan Berry  . APPENDECTOMY    . APPENDECTOMY    . BACK SURGERY    . cardiac catherization  10/31/2009  . CARDIAC CATHETERIZATION    . CATARACT EXTRACTION W/PHACO Left 08/20/2018   Procedure: CATARACT EXTRACTION PHACO AND INTRAOCULAR LENS PLACEMENT (IOC);  Surgeon: Nevada CraneKing, Bradley , MD;  Location: [facility] ORS;  Service: Ophthalmology;  Laterality: Left;  US 00:45.1 AP% 12.3 CDE  5.54 FLUID PACK LOT # [id number]  . CATARACT EXTRACTION W/PHACO Right 09/23/2018   Procedure: CATARACT EXTRACTION PHACO AND INTRAOCULAR LENS PLACEMENT (IOC);  Surgeon: Nevada CraneKing, Bradley , MD;  Location: [facility] ORS;  Service: Ophthalmology;  Laterality: Right;  US 00:45.0 CDE 5.19 Fluid Pack lot # [id number] H  . CERVICAL FUSION  C5 - 6/C6-7  . CHOLECYSTECTOMY  1972  . COLONOSCOPY WITH PROPOFOL N/A 07/27/2015   Procedure: COLONOSCOPY WITH PROPOFOL;  Surgeon: Wallace CullensPaul Y Oh, MD;  [facility];  Service: Gastroenterology;  Laterality: N/A;  . ESOPHAGOGASTRODUODENOSCOPY (EGD) WITH PROPOFOL N/A 07/27/2015   Procedure: ESOPHAGOGASTRODUODENOSCOPY (EGD) WITH PROPOFOL;  Surgeon: Wallace CullensPaul Y Oh, MD;  [facility];  Service: Gastroenterology;  Laterality: N/A;  . ESOPHAGOGASTRODUODENOSCOPY (EGD) WITH PROPOFOL N/A 05/19/2018   Procedure: ESOPHAGOGASTRODUODENOSCOPY (EGD) WITH PROPOFOL;  Surgeon: Midge MiniumWohl, Darren, MD;  [facility];  Service: Endoscopy;  Laterality: N/A;  . EYE SURGERY    . FOOT SURGERY Bilateral    5-6 years per patient  . SPINE SURGERY    . TONSILLECTOMY    . VASCULAR SURGERY     LEG STENTS   Social History   Socioeconomic History  . Marital status: Married    Spouse name: Not on file  . Number of children: 6  . Years of education: Not on file  . Highest education level: Not on  file  Occupational History  . Occupation: Disabled  Social Needs  . Financial resource strain: Not on file  . Food insecurity:    Worry: Not on file    Inability: Not on file  . Transportation needs:    Medical: Not on file    Non-medical: Not on file  Tobacco Use  . Smoking status: Current Every Day Smoker    Packs/day: 0.50    Years: 50.00    Pack years: 25.00    Types: Cigarettes  . Smokeless tobacco: Never Used  . Tobacco comment: Previously smoked 2 ppd  Substance and Sexual Activity  . Alcohol use: No    Alcohol/week: 0.0 standard drinks  . Drug use: No  . Sexual  activity: Never  Lifestyle  . Physical activity:    Days per week: Not on file    Minutes per session: Not on file  . Stress: Not on file  Relationships  . Social connections:    Talks on phone: Not on file    Gets together: Not on file    Attends religious service: Not on file    Active member of club or organization: Not on file    Attends meetings of clubs or organizations: Not on file    Relationship status: Not on file  . Intimate partner violence:    Fear of current or ex partner: Not on file    Emotionally abused: Not on file    Physically abused: Not on file    Forced sexual activity: Not on file  Other Topics Concern  . Not on file  Social History Narrative  . Not on file   Allergies  Allergen Reactions  . Percocet [Oxycodone-Acetaminophen] Hives and Rash  . Aspirin Nausea And Vomiting and Other (See Comments)  . Codeine Nausea And Vomiting, Nausea Only and Other (See Comments)  . Propoxyphene Nausea Only and Other (See Comments)  . Sulfa Antibiotics Rash and Other (See Comments)    Last set of Vital Signs (not current) Vitals:   01/15/19 1546 01/15/19 1600  BP:  (!) 79/45  Pulse:  93  Resp:  (!) 23  Temp:  (!) 101.6 F (38.7 C)  SpO2: 94% 97%      Physical Exam  Gen: unresponsive with agonal respirations and satting 99% on nonrebreather Cardiovascular: Tachycardic and regular, slightly cool the periphery bilateral Resp:   Diminished breath sounds, shallow with notable rhonchi throughout Abd: nondistended  Neuro: GCS 3, unresponsive to pain  HEENT: No blood in posterior pharynx, gag reflex absent  Neck: No crepitus  Musculoskeletal: No deformity  Skin: warm, slightly cool at nailbeds  Procedures  INTUBATION Performed by: Sharyn Creamer Required items: required blood products, implants, devices, and special equipment available Patient identity confirmed: provided demographic data and hospital-assigned identification number Time out: Immediately prior  to procedure a "time out" was called to verify the correct patient, procedure, equipment, support staff and site/side marked as required. Indications: Respiratory failure Intubation method: Video laryngoscopy Preoxygenation: BVM Sedatives: Etomidate Paralytic: Rocuronium Tube Size: 7.0 cuffed Post-procedure assessment: chest rise and ETCO2 monitor Breath sounds: equal and absent over the epigastrium Tube secured by Respiratory Therapy Patient tolerated the procedure well with no immediate complications.  CRITICAL CARE Performed by: Sharyn Creamer Total critical care time: 50 Critical care time was exclusive of separately billable procedures and treating other patients. Critical care was necessary to treat or prevent imminent or life-threatening deterioration. Critical care was time spent personally by me on the following  activities: development of treatment plan with patient and/or surrogate as well as nursing, discussions with consultants, evaluation of patient's response to treatment, examination of patient, obtaining history from patient or surrogate, ordering and performing treatments and interventions, ordering and review of laboratory studies, ordering and review of radiographic studies, pulse oximetry and re-evaluation of patient's condition.  Medical Decision making  Patient presents with respiratory failure, febrile.  Completely unresponsive.  Poor and shallow respirations.  Patient started on mechanical ventilation with good effect, blood pressure stable, somewhat tachycardic.  Initial broad-spectrum antibiotics ordered as patient does have a history of recent healthcare associated pneumonia.  Code sepsis initiated.  Consultation placed early in the patient's care to Dr. Sung Amabile of CCM service.  EKG cyst just demand ischemia.  Diffuse ST depressions.  IV Tylenol, patient tolerating sedation well on the ventilator.  Blood gas reviewed.  Assessment and Plan   Patient suspected to have  severe sepsis.  Respiratory failure.  Febrile.  Recent H CAP.  Rhonchorous lung sounds.  Chest x-ray concerning for possible infiltrates.  ----------------------------------------- 4:10 PM on 01/15/2019 -----------------------------------------  Patient blood pressure improving, fluid boluses ordered.  Discussed now present hypotension with Dr. Tildon Husky of the hospitalist service.  She is aware, will continue to follow the patient, patient slated to go to ICU bed shortly.  Family updated at the bedside.  Sepsis reassessment completed, patient condition worsened.  Hypotension now setting, titrated down on some of her sedation and fluid bolusing carefully monitoring for improvement with fluid challenge at this time.   Sharyn Creamer, MD 01/15/19 [id number]    Sharyn Creamer, MD 01/21/19 1121

## 2019-01-15 NOTE — H&P (Signed)
[facility] [place] NAME: Cheryl [name]    MR#:  [id number]  DATE OF BIRTH:  [birth year]  DATE OF ADMISSION:  01/15/2019  PRIMARY CARE PHYSICIAN: Birdie Sons, MD   REQUESTING/REFERRING PHYSICIAN: [email or web address]  CHIEF COMPLAINT:   unresponsiveness HISTORY OF PRESENT ILLNESS:  Cheryl [name]  is a 68 y.o. female with a known history of essential hypertension, COPD with most recent hospitalization about 2 weeks ago for GIB who presents today due to unresponsiveness.  Patient's family is at bedside and reports that yesterday she was not feeling well however at her baseline mental status.  This morning she was unresponsive so the son called EMS. EMS found her with severe respiratory distress.  Patient arrived to the ER with a nonrebreather and she was responsive to pain.  She has a temperature of 100.7.  Dr. Jacqualine Code intubated the patient as per the request of the family. She has been started on broad-spectrum antibiotics and given IV steroids.  She has had several hospitalizations for aspiration pneumonia and at her most recent discharge she was on a dysphagia level 3 diet with aspiration precautions. As for her recent GI bleed she did not want any intervention such as an endoscopy or swallow evaluation.  She was discharged on PPI. PAST MEDICAL HISTORY:   Past Medical History:  Diagnosis Date  . Acute postoperative pain 04/14/2017  . Allergy   . Anemia   . Anxiety   . Arthritis    fingers  . Aspiration pneumonitis ([place]) 11/24/2015  . Asthma   . CHF (congestive heart failure) ([place])   . Chronic pain   . COPD (chronic obstructive pulmonary disease) ([place])   . Coronary artery disease   . DVT (deep venous thrombosis) ([place])   . Dyspnea   . Dysrhythmia   . Encephalopathy    MULTIPLE TIMES  . GERD (gastroesophageal reflux disease)   . Headache   . Hyperlipidemia   . Hypertension   . Kyphoscoliosis deformity of spine   . Migraines   . Neuropathy  2010  . Osteoporosis   . Oxygen deficiency   . Peripheral vascular disease ([place])   . Pneumonia   . Pneumonia 11/19/2015  . Pneumonia 10/2016  . Pneumonia    aspiration 2018- 4 times in last year  . Rib fracture 09/19/2017   Displace right 9th rib fracture.   . Vitamin D deficiency   . Wears dentures    full upper and lower    PAST SURGICAL HISTORY:   Past Surgical History:  Procedure Laterality Date  . ABDOMINAL HYSTERECTOMY  1975   Bilaterl Oophorectomy; [place] to IUD infection  . abdomnal aortic stent  05/30/2008   Dr. Quay Burow  . APPENDECTOMY    . APPENDECTOMY    . BACK SURGERY    . cardiac catherization  10/31/2009  . CARDIAC CATHETERIZATION    . CATARACT EXTRACTION W/PHACO Left 08/20/2018   Procedure: CATARACT EXTRACTION PHACO AND INTRAOCULAR LENS PLACEMENT (IOC);  Surgeon: Eulogio Bear, MD;  Location: [facility] ORS;  Service: Ophthalmology;  Laterality: Left;  [place] 00:45.1 AP% 12.3 CDE 5.54 FLUID PACK LOT # [id number]  . CATARACT EXTRACTION W/PHACO Right 09/23/2018   Procedure: CATARACT EXTRACTION PHACO AND INTRAOCULAR LENS PLACEMENT ([place]);  Surgeon: Eulogio Bear, MD;  Location: [facility] ORS;  Service: Ophthalmology;  Laterality: Right;  [place] 00:45.0 CDE 5.19 Fluid Pack lot # [id number] H  . CERVICAL FUSION  C5 - 6/C6-7  . CHOLECYSTECTOMY  1972  .  COLONOSCOPY WITH PROPOFOL N/A 07/27/2015   Procedure: COLONOSCOPY WITH PROPOFOL;  Surgeon: Hulen Luster, MD;  Location[facility] ENDOSCOPY;  Service: Gastroenterology;  Laterality: N/A;  . ESOPHAGOGASTRODUODENOSCOPY (EGD) WITH PROPOFOL N/A 07/27/2015   Procedure: ESOPHAGOGASTRODUODENOSCOPY (EGD) WITH PROPOFOL;  Surgeon: Hulen Luster, MD;  Location: [facility] ENDOSCOPY;  Service: Gastroenterology;  Laterality: N/A;  . ESOPHAGOGASTRODUODENOSCOPY (EGD) WITH PROPOFOL N/A 05/19/2018   Procedure: ESOPHAGOGASTRODUODENOSCOPY (EGD) WITH PROPOFOL;  Surgeon: Lucilla Lame, MD;  Location: [facility] ENDOSCOPY;  Service: Endoscopy;  Laterality: N/A;  . EYE SURGERY     . FOOT SURGERY Bilateral    5-6 years per patient  . SPINE SURGERY    . TONSILLECTOMY    . VASCULAR SURGERY     LEG STENTS    SOCIAL HISTORY:   Social History   Tobacco Use  . Smoking status: Current Every Day Smoker    Packs/day: 0.50    Years: 50.00    Pack years: 25.00    Types: Cigarettes  . Smokeless tobacco: Never Used  . Tobacco comment: Previously smoked 2 ppd  Substance Use Topics  . Alcohol use: No    Alcohol/week: 0.0 standard drinks    FAMILY HISTORY:   Family History  Problem Relation Age of Onset  . Cancer Mother   . Arthritis Mother   . Heart disease Mother   . Diabetes Mother        mellitus, type 2  . Heart disease Father   . Diabetes Sister   . Cancer Brother   . Cancer Brother        lung  . Diabetes Brother     DRUG ALLERGIES:   Allergies  Allergen Reactions  . Percocet [Oxycodone-Acetaminophen] Hives and Rash  . Aspirin Nausea And Vomiting and Other (See Comments)  . Codeine Nausea And Vomiting, Nausea Only and Other (See Comments)  . Propoxyphene Nausea Only and Other (See Comments)  . Sulfa Antibiotics Rash and Other (See Comments)    REVIEW OF SYSTEMS:   Review of Systems  Unable to perform ROS: Acuity of condition    MEDICATIONS AT HOME:   Prior to Admission medications   Medication Sig Start Date End Date Taking? Authorizing Provider  albuterol (PROVENTIL) (2.5 MG/3ML) 0.083% nebulizer solution Take 3 mLs (2.5 mg total) by nebulization every 4 (four) hours as needed for wheezing. 12/24/17  Yes Birdie Sons, MD  alendronate (FOSAMAX) 70 MG tablet Take 1 tablet (70 mg total) by mouth once a week. Take with a full glass of water on an empty stomach. 12/04/18  Yes Birdie Sons, MD  ALPRAZolam Duanne Moron) 0.5 MG tablet Take 0.5-1 tablets (0.25-0.5 mg total) by mouth at bedtime as needed for anxiety. 08/19/18  Yes Birdie Sons, MD  amLODipine (NORVASC) 5 MG tablet Take 1 tablet (5 mg total) by mouth daily. 12/09/18  Yes  Birdie Sons, MD  atorvastatin (LIPITOR) 40 MG tablet Take 1 tablet (40 mg total) by mouth daily at 6 PM. 11/08/18  Yes Demetrios Loll, MD  DULoxetine (CYMBALTA) 30 MG capsule Take 30 mg by mouth daily. 01/12/19  Yes [provider]  estradiol (ESTRACE) 0.5 MG tablet Take 1 tablet (0.5 mg total) by mouth every other day. 09/09/18  Yes Birdie Sons, MD  fentaNYL (DURAGESIC - DOSED MCG/HR) 50 MCG/HR Place 1 patch (50 mcg total) onto the skin every 3 (three) days. 11/29/18 01/15/19 Yes Vevelyn Francois, NP  ferrous sulfate 325 (65 FE) MG tablet Take 1 tablet (325  mg total) by mouth 2 (two) times daily with a meal. 01/11/19  Yes Fisher, Kirstie Peri, MD  Fluticasone-Salmeterol (ADVAIR DISKUS) 250-50 MCG/DOSE AEPB TAKE ONE PUFF TWICE DAILY. 09/28/18  Yes Birdie Sons, MD  memantine (NAMENDA) 5 MG tablet Take 5 mg by mouth 2 (two) times daily.   Yes [provider]  pantoprazole (PROTONIX) 40 MG tablet Take 40 mg by mouth 2 (two) times daily. Take 1 tablet (40 mg) by mouth scheduled every morning, may repeat dose in evening if needed for heartburn/indigestion.   Yes [provider]  pregabalin (LYRICA) 300 MG capsule Take 300 mg by mouth 2 (two) times daily.   Yes [provider]  tiotropium (SPIRIVA HANDIHALER) 18 MCG inhalation capsule inhale the contents of one capsule in the handihaler once daily Patient taking differently: Place 18 mcg into inhaler and inhale daily. inhale the contents of one capsule in the handihaler once daily 01/23/18  Yes Fisher, Kirstie Peri, MD  vitamin B-12 (CYANOCOBALAMIN) 1000 MCG tablet Take 1 tablet (1,000 mcg total) by mouth daily. 05/21/18  Yes Wieting, Richard, MD  acetaminophen (TYLENOL) 325 MG tablet Take 2 tablets (650 mg total) by mouth every 6 (six) hours as needed for mild pain (or Fever >/= 101). 05/21/18   Loletha Grayer, MD  baclofen (LIORESAL) 10 MG tablet Take 1 tablet (10 mg total) by mouth 3 (three) times daily as needed for muscle  spasms. 08/17/18   Triplett, Cari B, FNP  BELSOMRA 5 MG TABS Take 1 tablet by mouth at bedtime as needed (for insomnia). Patient taking differently: Take 5 mg by mouth at bedtime as needed (insomnia).  11/16/18   Birdie Sons, MD  benzonatate (TESSALON PERLES) 100 MG capsule Take 1 capsule (100 mg total) by mouth 3 (three) times daily as needed for cough. 11/10/18   Birdie Sons, MD  celecoxib (CELEBREX) 100 MG capsule Take 100 mg by mouth 2 (two) times daily as needed. 11/23/18   [provider]  clopidogrel (PLAVIX) 75 MG tablet Take 75 mg by mouth daily.    [provider]  diphenoxylate-atropine (LOMOTIL) 2.5-0.025 MG tablet Take 1 tablet by mouth as needed for diarrhea or loose stools. 06/10/18   [provider]  donepezil (ARICEPT) 10 MG tablet Take 10 mg by mouth daily. 01/08/19   [provider]  furosemide (LASIX) 20 MG tablet Take 1 tablet (20 mg total) by mouth every other day. Patient not taking: Reported on 01/15/2019 11/08/18   Demetrios Loll, MD  mirtazapine (REMERON SOL-TAB) 15 MG disintegrating tablet Take 1 tablet (15 mg total) by mouth at bedtime. 01/11/19   Birdie Sons, MD  NARCAN 4 MG/0.1ML LIQD nasal spray kit Place 0.4 mg into the nose daily as needed (for accidental overdose.).  09/11/17   [provider]  ondansetron (ZOFRAN) 4 MG tablet Take 4 mg by mouth as needed. 08/04/18   [provider]  potassium chloride (K-DUR,KLOR-CON) 10 MEQ tablet Take 10 mEq by mouth daily. 12/31/18   [provider]  silver sulfADIAZINE (SILVADENE) 1 % cream Apply 1 application topically daily as needed (leg infection).     [provider]  triamcinolone (KENALOG) 0.025 % ointment Apply 1 application topically 2 (two) times daily. Patient taking differently: Apply 1 application topically 2 (two) times daily as needed (cracking skin).  06/15/18   Birdie Sons, MD  VENTOLIN HFA 108 [phone] Base) MCG/ACT inhaler Inhale 2  puffs into the lungs every 4 (four)  hours as needed for wheezing or shortness of breath. Patient taking differently: Inhale 2 puffs into the lungs every 4 (four) hours as needed for wheezing or shortness of breath.  08/21/18   Birdie Sons, MD      VITAL SIGNS:  Blood pressure 102/80, pulse (!) 121, temperature (!) 102 F (38.9 C), resp. rate (!) 8, weight 54.2 kg, SpO2 90 %.  PHYSICAL EXAMINATION:   Physical Exam Constitutional:      General: She is not in acute distress.    Appearance: She is ill-appearing.     Comments: Patient intubated and sedated.  Appears much older than age  HENT:     Head: Normocephalic.  Eyes:     General: No scleral icterus.    Comments: Bilateral pupils are 3 mm and sluggish  Neck:     Musculoskeletal: Neck supple.     Vascular: No JVD.     Trachea: No tracheal deviation.  Cardiovascular:     Rate and Rhythm: Normal rate and regular rhythm.     Heart sounds: Normal heart sounds. No murmur. No friction rub. No gallop.   Pulmonary:     Effort: No respiratory distress.     Breath sounds: Rhonchi present. No rales.  Chest:     Chest wall: No tenderness.  Abdominal:     General: Bowel sounds are normal. There is no distension.     Palpations: Abdomen is soft. There is no mass.     Tenderness: There is no abdominal tenderness. There is no guarding or rebound.  Musculoskeletal:        General: No swelling or tenderness.     Left lower leg: No edema.  Lymphadenopathy:     Cervical: No cervical adenopathy.  Skin:    General: Skin is warm.     Findings: No erythema or rash.  Neurological:     Comments: Intubated and sedated  Psychiatric:     Comments: Intubated and sedated       LABORATORY PANEL:   CBC Recent Labs  Lab 01/15/19 1409  WBC 12.9*  HGB 12.1  HCT 39.4  PLT 230   ------------------------------------------------------------------------------------------------------------------  Chemistries  Recent Labs  Lab  01/15/19 1409  NA 138  K 4.5  CL 99  CO2 29  GLUCOSE 106*  BUN 25*  CREATININE 1.13*  CALCIUM 8.8*  AST 25  ALT 15  ALKPHOS 78  BILITOT 0.8   ------------------------------------------------------------------------------------------------------------------  Cardiac Enzymes Recent Labs  Lab 01/15/19 1409  TROPONINI 0.03*   ------------------------------------------------------------------------------------------------------------------  RADIOLOGY:  Dg Chest Portable 1 View  Result Date: 01/15/2019 CLINICAL DATA:  Post intubation. EXAM: PORTABLE CHEST 1 VIEW COMPARISON:  Chest CT 01/01/2019 FINDINGS: Endotracheal tube in satisfactory position. Enteric catheter descends in a straight line below the left hemidiaphragm, position uncertain. Cardiomediastinal silhouette is normal. Mediastinal contours appear intact. Calcific atherosclerotic disease of the aorta. There is no evidence of pneumothorax. Bibasilar airspace consolidation versus atelectasis. Osseous structures are without acute abnormality. Soft tissues are grossly normal. IMPRESSION: 1. Endotracheal tube in satisfactory position. 2. Enteric catheter descends in a straight line below the left hemidiaphragm, position uncertain. Please correlate clinically. 3. Bibasilar airspace consolidation versus atelectasis. Electronically Signed   By: Fidela Salisbury M.D.   On: 01/15/2019 14:15    EKG:   Orders placed or performed during the hospital encounter of 01/15/19  . ED EKG 12-Lead  . ED EKG 12-Lead  . EKG 12-Lead  . EKG 12-Lead  . EKG 12-Lead  .  EKG 12-Lead    IMPRESSION AND PLAN:   67 year old female with history of COPD, multiple admissions in the past for aspiration pneumonia, chronic systolic and diastolic heart failure ejection fraction 45% who presents to the emergency room with unresponsiveness and acute respiratory distress.  1.  Acute hypoxic respiratory failure in the setting of pneumonia with mild COPD  exacerbation Patient is now intubated. Case discussed with Dr. Rod Mae management as per intensivist.  2.  HCAP: Continue cefepime. Her most recent MRSA PCR was negative and therefore I will not use vancomycin  3.  Acute encephalopathy in the setting of acute hypoxic respiratory failure with hypoxia: Continue monitor mental status Follow-up head CT  4.  Chronic combined systolic and diastolic heart failure without signs of exacerbation   5.  COPD exacerbation: Continue steroids and nebs  6.  Acute kidney injury in the setting of respiratory failure: Gentle hydration and repeat BMP in a.m. Hold nephrotoxic medications  7   Essential hypertension: PRN hydralazine All the records are reviewed and case discussed with ED provider. Management plans discussed with the patient;s family and they are in agreement  CODE STATUS: FULL  Critical care TOTAL TIME TAKING CARE OF THIS PATIENT: 60 minutes.    Alliene Klugh M.D on 01/15/2019 at 3:07 PM  Between 7am to 6pm - Pager - [phone]  After 6pm go to [email or web address] - password [email or web address] [place] [facility]  Office  [phone]  CC: Primary care physician; Birdie Sons, MD

## 2019-01-15 NOTE — Progress Notes (Signed)
PCCM CONSULT NOTE  Requesting MD/Service: Mody Date of initial consultation: 01/15/19 Reason for consultation: VDRF  PT PROFILE: 68 y.o. female smoker with multiple chronic medical problems and history of recurrent pneumonias presented [facility] ED 01/15/19 with fever, unresponsiveness, hypoxemia requiring intubation in ED.  MAJOR EVENTS/TEST RESULTS: 02/21 admission as above 02/21 CTA head: No acute findings   INDWELLING DEVICES:: ETT 02/21 >>    MICRO DATA: MRSA PCR 02/21 >>  Resp 02/21 >>  Blood 02/21 >>   ANTIMICROBIALS:  Vanc 02/21 >> 02/21 Metronidazole 02/21 >> 02/21 Cefepime 02/21 >>   24 INTERVAL:  HPI:  Patient is intubated and is unable to provide history.  The history is provided by her husband.  She was discharged from this hospital 01/03/2019 with a discharge diagnosis of upper GI bleed and healthcare associated pneumonia.  Per her husband, she remained very weak since the time of discharge.  On the day prior to admission she was noted to have multiple fevers.  On the morning of admission her husband found her febrile (100.9) with decreased responsiveness.  She was too weak for him to assist her in getting into the car.  Therefore he called EMS.  Upon [facility] she was on a nonrebreather with nasal trumpets in.  She was intubated shortly after arrival to the emergency department.  Temperature in the emergency department was 102.  Blood pressure dropped transiently after intubation.  Past Medical History:  Diagnosis Date  . Acute postoperative pain 04/14/2017  . Allergy   . Anemia   . Anxiety   . Arthritis    fingers  . Aspiration pneumonitis (HCC) 11/24/2015  . Asthma   . CHF (congestive heart failure) (HCC)   . Chronic pain   . COPD (chronic obstructive pulmonary disease) (HCC)   . Coronary artery disease   . DVT (deep venous thrombosis) (HCC)   . Dyspnea   . Dysrhythmia   . Encephalopathy    MULTIPLE TIMES  . GERD (gastroesophageal reflux  disease)   . Headache   . Hyperlipidemia   . Hypertension   . Kyphoscoliosis deformity of spine   . Migraines   . Neuropathy 2010  . Osteoporosis   . Oxygen deficiency   . Peripheral vascular disease (HCC)   . Pneumonia   . Pneumonia 11/19/2015  . Pneumonia 10/2016  . Pneumonia    aspiration 2018- 4 times in last year  . Rib fracture 09/19/2017   Displace right 9th rib fracture.   . Vitamin D deficiency   . Wears dentures    full upper and lower    Past Surgical History:  Procedure Laterality Date  . ABDOMINAL HYSTERECTOMY  1975   Bilaterl Oophorectomy; Dur to IUD infection  . abdomnal aortic stent  05/30/2008   Dr. Nanetta BattyJonathan Berry  . APPENDECTOMY    . APPENDECTOMY    . BACK SURGERY    . cardiac catherization  10/31/2009  . CARDIAC CATHETERIZATION    . CATARACT EXTRACTION W/PHACO Left 08/20/2018   Procedure: CATARACT EXTRACTION PHACO AND INTRAOCULAR LENS PLACEMENT (IOC);  Surgeon: Nevada CraneKing, Bradley Mark, MD;  Location: [facility] ORS;  Service: Ophthalmology;  Laterality: Left;  US 00:45.1 AP% 12.3 CDE 5.54 FLUID PACK LOT # [id number]  . CATARACT EXTRACTION W/PHACO Right 09/23/2018   Procedure: CATARACT EXTRACTION PHACO AND INTRAOCULAR LENS PLACEMENT (IOC);  Surgeon: Nevada CraneKing, Bradley Mark, MD;  Location: [facility] ORS;  Service: Ophthalmology;  Laterality: Right;  US 00:45.0 CDE 5.19 Fluid Pack lot # [id number] H  .  CERVICAL FUSION  C5 - 6/C6-7  . CHOLECYSTECTOMY  1972  . COLONOSCOPY WITH PROPOFOL N/A 07/27/2015   Procedure: COLONOSCOPY WITH PROPOFOL;  Surgeon: Wallace Cullens, MD;  Location: [facility] ENDOSCOPY;  Service: Gastroenterology;  Laterality: N/A;  . ESOPHAGOGASTRODUODENOSCOPY (EGD) WITH PROPOFOL N/A 07/27/2015   Procedure: ESOPHAGOGASTRODUODENOSCOPY (EGD) WITH PROPOFOL;  Surgeon: Wallace Cullens, MD;  Location: [facility] ENDOSCOPY;  Service: Gastroenterology;  Laterality: N/A;  . ESOPHAGOGASTRODUODENOSCOPY (EGD) WITH PROPOFOL N/A 05/19/2018   Procedure: ESOPHAGOGASTRODUODENOSCOPY (EGD) WITH PROPOFOL;   Surgeon: Midge Minium, MD;  Location: [facility] ENDOSCOPY;  Service: Endoscopy;  Laterality: N/A;  . EYE SURGERY    . FOOT SURGERY Bilateral    5-6 years per patient  . SPINE SURGERY    . TONSILLECTOMY    . VASCULAR SURGERY     LEG STENTS    MEDICATIONS: I have reviewed all medications and confirmed regimen as documented  Social History   Socioeconomic History  . Marital status: Married    Spouse name: Not on file  . Number of children: 6  . Years of education: Not on file  . Highest education level: Not on file  Occupational History  . Occupation: Disabled  Social Needs  . Financial resource strain: Not on file  . Food insecurity:    Worry: Not on file    Inability: Not on file  . Transportation needs:    Medical: Not on file    Non-medical: Not on file  Tobacco Use  . Smoking status: Current Every Day Smoker    Packs/day: 0.50    Years: 50.00    Pack years: 25.00    Types: Cigarettes  . Smokeless tobacco: Never Used  . Tobacco comment: Previously smoked 2 ppd  Substance and Sexual Activity  . Alcohol use: No    Alcohol/week: 0.0 standard drinks  . Drug use: No  . Sexual activity: Never  Lifestyle  . Physical activity:    Days per week: Not on file    Minutes per session: Not on file  . Stress: Not on file  Relationships  . Social connections:    Talks on phone: Not on file    Gets together: Not on file    Attends religious service: Not on file    Active member of club or organization: Not on file    Attends meetings of clubs or organizations: Not on file    Relationship status: Not on file  . Intimate partner violence:    Fear of current or ex partner: Not on file    Emotionally abused: Not on file    Physically abused: Not on file    Forced sexual activity: Not on file  Other Topics Concern  . Not on file  Social History Narrative  . Not on file    Family History  Problem Relation Age of Onset  . Cancer Mother   . Arthritis Mother   . Heart disease  Mother   . Diabetes Mother        mellitus, type 2  . Heart disease Father   . Diabetes Sister   . Cancer Brother   . Cancer Brother        lung  . Diabetes Brother     ROS: Level 5 caveat.  Other than fever and weakness, review of systems cannot be obtained   Vitals:   01/15/19 1546 01/15/19 1600 01/15/19 1618 01/15/19 1716  BP:  (!) 79/45 (!) 97/40 (!) 103/54  Pulse:  93 99 (!)  105  Resp:  (!) 23 15 16   Temp:  (!) 101.6 F (38.7 C) (!) 100.5 F (38.1 C) 99.1 F (37.3 C)  TempSrc:   Core Oral  SpO2: 94% 97% 100% 98%  Weight:    52.9 kg  Height:    4\' 10"  (1.473 m)     EXAM:  Gen: Appears much older than true age, frail appearing, intubated, minimally responsive on sedative drips, synchronous with ventilator HEENT: NCAT, sclera white, ETT in place Neck: Supple without LAN, thyromegaly, JVD Lungs: breath sounds diminished throughout with scattered rhonchi.  No wheezes Cardiovascular: RRR, no murmurs noted Abdomen: Soft, nontender, normal BS Ext: without clubbing, cyanosis, edema Neuro: CNs grossly intact, withdraws all extremities Skin: Limited exam, no lesions noted  DATA:   BMP Latest Ref Rng & Units 01/15/2019 01/02/2019 01/01/2019  Glucose 70 - 99 mg/dL 888(K) 76 84  BUN 8 - 23 mg/dL 80(K) 11 16  Creatinine 0.44 - 1.00 mg/dL 3.49(Z) 7.91 5.05  BUN/Creat Ratio 12 - 28 - - -  Sodium 135 - 145 mmol/L 138 141 138  Potassium 3.5 - 5.1 mmol/L 4.5 3.9 4.5  Chloride 98 - 111 mmol/L 99 108 101  CO2 22 - 32 mmol/L 29 26 26   Calcium 8.9 - 10.3 mg/dL 6.9(V) 7.6(L) 9.2    CBC Latest Ref Rng & Units 01/15/2019 01/02/2019 01/01/2019  WBC 4.0 - 10.5 K/uL 12.9(H) 9.3 11.4(H)  Hemoglobin 12.0 - 15.0 g/dL 94.8 0.1(K) 55.3  Hematocrit 36.0 - 46.0 % 39.4 31.9(L) 43.3  Platelets 150 - 400 K/uL 230 208 303    CXR: LLL retrocardiac opacity consistent with pneumonia  I have personally reviewed all chest radiographs reported above including CXRs and CT chest unless otherwise  indicated  IMPRESSION:   Acute on chronic hypoxemic respiratory failure Left lower lobe HCAP Ventilator dependence Recalcitrant smoker Severe sepsis Elevated PCT AKI.  Baseline creatinine 0.62 Acute encephalopathy, likely due to severe sepsis   PLAN:  Vent settings established Vent bundle implemented Daily SBT as indicated ICU hemodynamic monitoring MAP goal >65 mmHg Monitor BMET intermittently Monitor I/Os Correct electrolytes as indicated Monitor temp, WBC count Micro and abx as above PAD protocol - propofol infusion and PRN fentanyl Daily WUA  Husband updated at bedside   Billy Fischer, MD PCCM service Mobile [phone] Pager [phone] 01/15/2019 6:01 PM

## 2019-01-16 DIAGNOSIS — J150 Pneumonia due to Klebsiella pneumoniae: Secondary | ICD-10-CM

## 2019-01-16 DIAGNOSIS — F17200 Nicotine dependence, unspecified, uncomplicated: Secondary | ICD-10-CM

## 2019-01-16 DIAGNOSIS — G894 Chronic pain syndrome: Secondary | ICD-10-CM

## 2019-01-16 LAB — BASIC METABOLIC PANEL
ANION GAP: 6 (ref 5–15)
BUN: 23 mg/dL (ref 8–23)
CALCIUM: 7.5 mg/dL — AB (ref 8.9–10.3)
CO2: 26 mmol/L (ref 22–32)
Chloride: 108 mmol/L (ref 98–111)
Creatinine, Ser: 0.91 mg/dL (ref 0.44–1.00)
GFR calc Af Amer: >60 mL/min (ref >60)
GFR calc non Af Amer: >60 mL/min (ref >60)
Glucose, Bld: 161 mg/dL — ABNORMAL HIGH (ref 70–99)
Potassium: 4.2 mmol/L (ref 3.5–5.1)
Sodium: 140 mmol/L (ref 135–145)

## 2019-01-16 LAB — CBC
HCT: 35.6 % — ABNORMAL LOW (ref 36.0–46.0)
Hemoglobin: 10.8 g/dL — ABNORMAL LOW (ref 12.0–15.0)
MCH: 30.9 pg (ref 26.0–34.0)
MCHC: 30.3 g/dL (ref 30.0–36.0)
MCV: 102 fL — ABNORMAL HIGH (ref 80.0–100.0)
PLATELETS: 211 10*3/uL (ref 150–400)
RBC: 3.49 MIL/uL — AB (ref 3.87–5.11)
RDW: 14.1 % (ref 11.5–15.5)
WBC: 18.5 10*3/uL — ABNORMAL HIGH (ref 4.0–10.5)
nRBC: 0 % (ref 0.0–0.2)

## 2019-01-16 LAB — TROPONIN I: Troponin I: <0.03 ng/mL (ref <0.03)

## 2019-01-16 MED ORDER — CHLORHEXIDINE GLUCONATE 0.12% ORAL RINSE (MEDLINE KIT)
15.0000 mL | Freq: Two times a day (BID) | OROMUCOSAL | Status: DC
  Administered 2019-01-16: 15 mL via OROMUCOSAL

## 2019-01-16 MED ORDER — ORAL CARE MOUTH RINSE
15.0000 mL | OROMUCOSAL | Status: DC
  Administered 2019-01-16 (×2): 15 mL via OROMUCOSAL

## 2019-01-16 MED ORDER — ENOXAPARIN SODIUM 40 MG/0.4ML ~~LOC~~ SOLN
40.0000 mg | SUBCUTANEOUS | Status: DC
  Administered 2019-01-16 – 2019-01-19 (×4): 40 mg via SUBCUTANEOUS
  Filled 2019-01-16 (×4): qty 0.4

## 2019-01-16 MED ORDER — BUDESONIDE 0.25 MG/2ML IN SUSP
0.2500 mg | Freq: Two times a day (BID) | RESPIRATORY_TRACT | Status: DC
  Administered 2019-01-16 – 2019-01-20 (×7): 0.25 mg via RESPIRATORY_TRACT
  Filled 2019-01-16 (×8): qty 2

## 2019-01-16 MED ORDER — TRAMADOL HCL 50 MG PO TABS
50.0000 mg | ORAL_TABLET | Freq: Four times a day (QID) | ORAL | Status: DC | PRN
  Administered 2019-01-18 – 2019-01-20 (×4): 50 mg via ORAL
  Filled 2019-01-16 (×4): qty 1

## 2019-01-16 MED ORDER — PHENYLEPHRINE HCL-NACL 10-0.9 MG/250ML-% IV SOLN
0.0000 ug/min | INTRAVENOUS | Status: DC
  Administered 2019-01-16: 20 ug/min via INTRAVENOUS
  Administered 2019-01-16: 10 ug/min via INTRAVENOUS
  Filled 2019-01-16 (×2): qty 250

## 2019-01-16 MED ORDER — FENTANYL 50 MCG/HR TD PT72
1.0000 | MEDICATED_PATCH | TRANSDERMAL | Status: DC
  Administered 2019-01-16 – 2019-01-19 (×2): 1 via TRANSDERMAL
  Filled 2019-01-16 (×2): qty 1

## 2019-01-16 NOTE — Consult Note (Signed)
Pharmacy Antibiotic Note  Cheryl [name] is a 68 y.o. female admitted on 01/15/2019 with sepsis.  Pharmacy has been consulted for cefepime dosing. She has several recent admissions here at [facility] but has not received vancomycin long enough for levels to have been drawn. The most recent admission was approximately 2 weeks ago for PNA. CXR today shows possible PNA.  Plan:  Continue cefepime 2 grams IV every 12 hours  Height: 4\' 10"  (147.3 cm) Weight: 121 lb 14.6 oz (55.3 kg) IBW/kg (Calculated) : 40.9  Temp (24hrs), Avg:98.4 F (36.9 C), Min:96.4 F (35.8 C), Max:104 F (40 C)  Recent Labs  Lab 01/15/19 1409 01/16/19 0454  WBC 12.9* 18.5*  CREATININE 1.13* 0.91  LATICACIDVEN 2.6*  --     Estimated Creatinine Clearance: 43 mL/min (by C-G formula based on SCr of 0.91 mg/dL).    Allergies  Allergen Reactions  . Percocet [Oxycodone-Acetaminophen] Hives and Rash  . Aspirin Nausea And Vomiting and Other (See Comments)  . Codeine Nausea And Vomiting, Nausea Only and Other (See Comments)  . Propoxyphene Nausea Only and Other (See Comments)  . Sulfa Antibiotics Rash and Other (See Comments)    Antimicrobials this admission: vancomycin 2/21 x1  cefepime 2/21 >>  metronidazole 2/21 x 1  Microbiology results: 2/21 BCx: pending 2/21 influenza: negative 2/21 UCx: pending  2/21 Resp Cx: pending   Thank you for allowing pharmacy to be a part of this patient's care.  Gardner Candle, PharmD, BCPS Clinical Pharmacist 01/16/2019 10:58 AM

## 2019-01-16 NOTE — Progress Notes (Signed)
Anticoagulation monitoring(Lovenox):  68yo  female ordered Lovenox 30 mg Q24h for DVT prevention.  Filed Weights   01/15/19 1340 01/15/19 1716 01/16/19 0500  Weight: 119 lb 7.8 oz (54.2 kg) 116 lb 10 oz (52.9 kg) 121 lb 14.6 oz (55.3 kg)   BMI 25.48   Lab Results  Component Value Date   CREATININE 0.91 01/16/2019   CREATININE 1.13 (H) 01/15/2019   CREATININE 0.62 01/02/2019   Estimated Creatinine Clearance: 43 mL/min (by C-G formula based on SCr of 0.91 mg/dL). Hemoglobin & Hematocrit     Component Value Date/Time   HGB 10.8 (L) 01/16/2019 0454   HGB 10.7 (L) 12/01/2018 1145   HCT 35.6 (L) 01/16/2019 0454   HCT 33.0 (L) 12/01/2018 1145     Per Protocol for Patient with estCrcl > 30 ml/min and BMI < 40, will transition to Lovenox 40 mg Q24h.     Clovia Cuff, PharmD, BCPS 01/16/2019 9:02 AM

## 2019-01-16 NOTE — Progress Notes (Signed)
PCCM PROGRESS NOTE  Requesting MD/Service: Mody Date of initial consultation: 01/15/19 Reason for consultation: VDRF  PT PROFILE: 68 y.o. female smoker with multiple chronic medical problems and history of recurrent pneumonias presented to [facility] ED 01/15/19 with fever, unresponsiveness, hypoxemia requiring intubation in ED.  MAJOR EVENTS/TEST RESULTS: 02/21 admission as above 02/21 CTA head: No acute findings 02/22 Extubated  INDWELLING DEVICES:: ETT 02/21 >> 02/22   MICRO DATA: Resp 02/21 >> few Klebsiella Blood 02/21 >>   ANTIMICROBIALS:  Vanc 02/21 >> 02/21 Metronidazole 02/21 >> 02/21 Cefepime 02/21 >>   24 INTERVAL: No major events   SUBJ:  Passed SBT this a.m.  Extubated under my supervision.  Tolerating well.  Presently, mildly lethargic but cognition appears to be intact and follows commands readily.  No overt distress   Vitals:   01/16/19 1400 01/16/19 1500 01/16/19 1600 01/16/19 1700  BP: 120/62 125/65 127/72 136/75  Pulse: (!) 105 (!) 107 (!) 103 (!) 102  Resp:      Temp: 97.9 F (36.6 C) 97.7 F (36.5 C) 97.7 F (36.5 C) 97.9 F (36.6 C)  TempSrc:   Core   SpO2: 93% 93% 96% 96%  Weight:      Height:      2 LPM [place]  EXAM:  Gen: Tolerating extubation well.  Comfortable on [place] O2  HEENT: NCAT, sclera white Neck: Supple without JVD Lungs: Breath sounds diffusely diminished, no wheezes Cardiovascular: Regular, no M Abdomen: Soft, NT, NABS Ext: No C/C/E Neuro: No focal deficits Skin: Limited exam, no lesions noted  DATA:   BMP Latest Ref Rng & Units 01/16/2019 01/15/2019 01/02/2019  Glucose 70 - 99 mg/dL 891(Q) 945(W) 76  BUN 8 - 23 mg/dL 23 38(U) 11  Creatinine 0.44 - 1.00 mg/dL 8.28 0.03(K) 9.17  BUN/Creat Ratio 12 - 28 - - -  Sodium 135 - 145 mmol/L 140 138 141  Potassium 3.5 - 5.1 mmol/L 4.2 4.5 3.9  Chloride 98 - 111 mmol/L 108 99 108  CO2 22 - 32 mmol/L 26 29 26   Calcium 8.9 - 10.3 mg/dL 7.5(L) 8.8(L) 7.6(L)    CBC Latest Ref Rng &  Units 01/16/2019 01/15/2019 01/02/2019  WBC 4.0 - 10.5 K/uL 18.5(H) 12.9(H) 9.3  Hemoglobin 12.0 - 15.0 g/dL 10.8(L) 12.1 9.6(L)  Hematocrit 36.0 - 46.0 % 35.6(L) 39.4 31.9(L)  Platelets 150 - 400 K/uL 211 230 208    CXR: No new film  I have personally reviewed all chest radiographs reported above including CXRs and CT chest unless otherwise indicated  IMPRESSION:   Acute on chronic hypoxemic respiratory failure Left lower lobe HCAP Ventilator dependence, appears to be resolved Recalcitrant smoker Severe sepsis, resolving Elevated PCT AKI, nonoliguric.  Creatinine improving.  Baseline creatinine 0.62 Acute encephalopathy, resolved Chronic pain syndrome on Duragesic patch PTA   PLAN:  Monitor and ICU/SDU post extubation Supplemental oxygen as needed to maintain SPO2 >90% Continue ICU hemodynamic monitoring MAP goal > 65 mmHg Monitor BMET intermittently Monitor I/Os Correct electrolytes as indicated Monitor temp, WBC count Micro and abx as above Resume Duragesic patch Low-dose fentanyl as needed Advance diet and activity as able  Husband updated at bedside   Billy Fischer, MD PCCM service Mobile [phone] Pager [phone] 01/16/2019 5:32 PM

## 2019-01-16 NOTE — Progress Notes (Signed)
[employer] [facility] [place] [facility]   PATIENT NAME: Cheryl [name]    MR#:  [id number]  DATE OF BIRTH:  August 29, 1951  SUBJECTIVE:  CHIEF COMPLAINT:   Chief Complaint  Patient presents with  . Respiratory Distress  . Altered Mental Status   Patient admitted with acute hypoxic respiratory failure secondary to pneumonia and COPD exacerbation.  Was intubated and admitted to ICU. Being managed by critical care team.  Anticipating weaning off the vent today.  REVIEW OF SYSTEMS:  ROS Unobtainable due to patient being on the vent. DRUG ALLERGIES:   Allergies  Allergen Reactions  . Percocet [Oxycodone-Acetaminophen] Hives and Rash  . Aspirin Nausea And Vomiting and Other (See Comments)  . Codeine Nausea And Vomiting, Nausea Only and Other (See Comments)  . Propoxyphene Nausea Only and Other (See Comments)  . Sulfa Antibiotics Rash and Other (See Comments)   VITALS:  Blood pressure (!) 118/51, pulse 93, temperature (!) 97.5 F (36.4 C), temperature source Bladder, resp. rate 14, height 4\' 10"  (1.473 m), weight 55.3 kg, SpO2 97 %. PHYSICAL EXAMINATION:   Physical Exam  Constitutional: No distress.  Patient sedated on the vent this morning  HENT:  Head: Normocephalic and atraumatic.  Patient on the vent  Eyes: Pupils are equal, round, and reactive to light. Conjunctivae are normal. Right eye exhibits no discharge.  Neck: Neck supple. No tracheal deviation present.  Cardiovascular: Normal rate, regular rhythm and normal heart sounds.  Respiratory: Effort normal. No respiratory distress.  Patient on the vent  GI: Soft. Bowel sounds are normal. She exhibits no distension.  Musculoskeletal:        General: No tenderness or edema.  Neurological:  Patient on the vent this morning.  Full neuro examination not done.  Skin: Skin is warm. No erythema.  Psychiatric:  On the vent.   LABORATORY PANEL:  Female CBC Recent Labs  Lab 01/16/19 0454  WBC 18.5*  HGB 10.8*    HCT 35.6*  PLT 211   ------------------------------------------------------------------------------------------------------------------ Chemistries  Recent Labs  Lab 01/15/19 1409 01/16/19 0454  NA 138 140  K 4.5 4.2  CL 99 108  CO2 29 26  GLUCOSE 106* 161*  BUN 25* 23  CREATININE 1.13* 0.91  CALCIUM 8.8* 7.5*  AST 25  --   ALT 15  --   ALKPHOS 78  --   BILITOT 0.8  --    RADIOLOGY:  Ct Head Wo Contrast  Result Date: 01/15/2019 CLINICAL DATA:  Unresponsive.  Sepsis. EXAM: CT HEAD WITHOUT CONTRAST TECHNIQUE: Contiguous axial images were obtained from the base of the skull through the vertex without intravenous contrast. COMPARISON:  08/17/2018 FINDINGS: Brain: Ventricle size and cerebral volume normal. Patchy white matter hypodensity bilaterally is unchanged and chronic. No acute infarct, hemorrhage, mass Vascular: Negative for hyperdense vessel Skull: Negative Sinuses/Orbits: Mucosal edema paranasal sinuses. Air-fluid level maxillary sinus bilaterally. Air-fluid level sphenoid sinus. Bilateral cataract surgery. Other: None IMPRESSION: No acute intracranial abnormality. Chronic microvascular ischemic changes in the white matter Sinusitis with air-fluid levels. Electronically Signed   By: Marlan Palau M.D.   On: 01/15/2019 16:44   ASSESSMENT AND PLAN:   68 year old female with history of COPD, multiple admissions in the past for aspiration pneumonia, chronic systolic and diastolic heart failure ejection fraction 45% who presented to the emergency room with unresponsiveness and acute respiratory distress.  1.  Acute hypoxic respiratory failure in the setting of pneumonia with mild COPD exacerbation Patient was intubated. Seen by critical  care physician this morning.  Making plans for extubation today.  2.  HCAP: Continue cefepime. Her most recent MRSA PCR was negative and therefore vancomycin was not continued  3.  Acute encephalopathy in the setting of acute hypoxic  respiratory failure with hypoxia: Continue monitor mental status CT head with no acute findings.  4.  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  COPD exacerbation: Continue steroids and nebs  6.  Acute kidney injury in the setting of respiratory failure: Gentle hydration and repeat BMP in a.m. Hold nephrotoxic medications  7   Essential hypertension: PRN hydralazine  DVT prophylaxis; Lovenox  All the records are reviewed and case discussed with ED provider. Management plans discussed with the patient;s family and they are in agreement     All the records are reviewed and case discussed with Care Management/Social Worker. Management plans discussed with the patient, family and they are in agreement.  CODE STATUS: Full Code  TOTAL TIME TAKING CARE OF THIS PATIENT: 36 minutes.   More than 50% of the time was spent in counseling/coordination of care: YES  POSSIBLE D/C IN 2-3 DAYS, DEPENDING ON CLINICAL CONDITION.   Nassim Cosma M.D on 01/16/2019 at 3:38 PM  Between 7am to 6pm - Pager - [phone]  After 6pm go to [email or web address] - Social research officer, government  [employer] [place] [facility]  Office  [phone]  CC: Primary care physician; Malva Limes, MD  Note: This dictation was prepared with Dragon dictation along with smaller phrase technology. Any transcriptional errors that result from this process are unintentional.

## 2019-01-16 NOTE — Progress Notes (Signed)
Extubated without complications to 3lnc 

## 2019-01-16 NOTE — Progress Notes (Addendum)
01/15/2019 2000 Contacted E-Link MD regarding patients blood pressures;systolics below 90 and MAPs less than 65. Per E-Link MD she is content with patient's MAP of 63 and to titrate down on sedation if needed. Will continue to monitor.

## 2019-01-17 DIAGNOSIS — R29898 Other symptoms and signs involving the musculoskeletal system: Secondary | ICD-10-CM

## 2019-01-17 LAB — CULTURE, RESPIRATORY W GRAM STAIN

## 2019-01-17 LAB — CULTURE, RESPIRATORY

## 2019-01-17 LAB — GLUCOSE, CAPILLARY: Glucose-Capillary: 125 mg/dL — ABNORMAL HIGH (ref 70–99)

## 2019-01-17 MED ORDER — MIRTAZAPINE 15 MG PO TBDP
15.0000 mg | ORAL_TABLET | Freq: Every day | ORAL | Status: DC
  Administered 2019-01-18 – 2019-01-19 (×2): 15 mg via ORAL
  Filled 2019-01-17 (×4): qty 1

## 2019-01-17 MED ORDER — DULOXETINE HCL 30 MG PO CPEP
30.0000 mg | ORAL_CAPSULE | Freq: Every day | ORAL | Status: DC
  Administered 2019-01-18 – 2019-01-20 (×3): 30 mg via ORAL
  Filled 2019-01-17 (×4): qty 1

## 2019-01-17 MED ORDER — HYDRALAZINE HCL 20 MG/ML IJ SOLN
10.0000 mg | INTRAMUSCULAR | Status: DC | PRN
  Administered 2019-01-17: 10 mg via INTRAVENOUS
  Administered 2019-01-17: 20 mg via INTRAVENOUS
  Filled 2019-01-17 (×2): qty 1

## 2019-01-17 MED ORDER — MEMANTINE HCL 5 MG PO TABS
5.0000 mg | ORAL_TABLET | Freq: Two times a day (BID) | ORAL | Status: DC
  Administered 2019-01-18 – 2019-01-20 (×5): 5 mg via ORAL
  Filled 2019-01-17 (×7): qty 1

## 2019-01-17 MED ORDER — METHYLPREDNISOLONE SODIUM SUCC 40 MG IJ SOLR
40.0000 mg | Freq: Every day | INTRAMUSCULAR | Status: DC
  Administered 2019-01-18 – 2019-01-20 (×3): 40 mg via INTRAVENOUS
  Filled 2019-01-17 (×3): qty 1

## 2019-01-17 MED ORDER — ATORVASTATIN CALCIUM 20 MG PO TABS
40.0000 mg | ORAL_TABLET | Freq: Every day | ORAL | Status: DC
  Administered 2019-01-18 – 2019-01-19 (×2): 40 mg via ORAL
  Filled 2019-01-17 (×2): qty 2

## 2019-01-17 MED ORDER — PANTOPRAZOLE SODIUM 40 MG PO TBEC
40.0000 mg | DELAYED_RELEASE_TABLET | Freq: Two times a day (BID) | ORAL | Status: DC
  Administered 2019-01-18 – 2019-01-20 (×5): 40 mg via ORAL
  Filled 2019-01-17 (×7): qty 1

## 2019-01-17 MED ORDER — CLOPIDOGREL BISULFATE 75 MG PO TABS
75.0000 mg | ORAL_TABLET | Freq: Every day | ORAL | Status: DC
  Administered 2019-01-18 – 2019-01-20 (×3): 75 mg via ORAL
  Filled 2019-01-17 (×4): qty 1

## 2019-01-17 MED ORDER — DONEPEZIL HCL 5 MG PO TABS
10.0000 mg | ORAL_TABLET | Freq: Every day | ORAL | Status: DC
  Administered 2019-01-18 – 2019-01-19 (×2): 10 mg via ORAL
  Filled 2019-01-17 (×3): qty 2

## 2019-01-17 MED ORDER — AMLODIPINE BESYLATE 5 MG PO TABS
5.0000 mg | ORAL_TABLET | Freq: Every day | ORAL | Status: DC
  Administered 2019-01-18 – 2019-01-20 (×3): 5 mg via ORAL
  Filled 2019-01-17 (×4): qty 1

## 2019-01-17 NOTE — Progress Notes (Signed)
[facility] [place] [facility]   PATIENT NAME: Cheryl [name]    MR#:  [id number]  DATE OF BIRTH:  1951/10/26  SUBJECTIVE:  CHIEF COMPLAINT:   Chief Complaint  Patient presents with  . Respiratory Distress  . Altered Mental Status   Patient admitted with acute hypoxic respiratory failure secondary to pneumonia and COPD exacerbation.  Was intubated and admitted to ICU.  Patient subsequently extubated on 01/16/2019.  Awake and alert and oriented this morning with no complaints.  Plans for transfer out of ICU once bed available.  Husband at bedside updated on treatment plans.   REVIEW OF SYSTEMS:  Review of Systems  Constitutional: Negative for chills and fever.  HENT: Negative for hearing loss and tinnitus.   Eyes: Negative for blurred vision and double vision.  Respiratory: Positive for shortness of breath. Negative for cough.   Cardiovascular: Negative for chest pain and palpitations.  Gastrointestinal: Negative for heartburn, nausea and vomiting.  Genitourinary: Negative for dysuria and urgency.  Musculoskeletal: Negative for myalgias and neck pain.  Skin: Negative for itching and rash.  Neurological: Negative for dizziness and headaches.  Psychiatric/Behavioral: Negative for depression and hallucinations.    DRUG ALLERGIES:   Allergies  Allergen Reactions  . Percocet [Oxycodone-Acetaminophen] Hives and Rash  . Aspirin Nausea And Vomiting and Other (See Comments)  . Codeine Nausea And Vomiting, Nausea Only and Other (See Comments)  . Propoxyphene Nausea Only and Other (See Comments)  . Sulfa Antibiotics Rash and Other (See Comments)   VITALS:  Blood pressure (!) 184/83, pulse 74, temperature 98.8 F (37.1 C), temperature source Bladder, resp. rate 14, height 4\' 10"  (1.473 m), weight 55.3 kg, SpO2 94 %. PHYSICAL EXAMINATION:  Physical Exam  Constitutional: She is oriented to person, place, and time. She appears well-developed and well-nourished.    HENT:  Head: Normocephalic and atraumatic.  Right Ear: External ear normal.  Eyes: Pupils are equal, round, and reactive to light. Conjunctivae and EOM are normal. Right eye exhibits no discharge.  Neck: Normal range of motion. Neck supple. No tracheal deviation present.  Cardiovascular: Normal rate, regular rhythm and normal heart sounds.  Respiratory: Effort normal and breath sounds normal. She has no wheezes.  GI: Soft. Bowel sounds are normal.  Musculoskeletal: Normal range of motion.        General: No edema.  Neurological: She is alert and oriented to person, place, and time. She has normal reflexes. No cranial nerve deficit.  Skin: Skin is warm. No erythema.  Psychiatric: She has a normal mood and affect. Her behavior is normal.    LABORATORY PANEL:  Female CBC Recent Labs  Lab 01/16/19 0454  WBC 18.5*  HGB 10.8*  HCT 35.6*  PLT 211   ------------------------------------------------------------------------------------------------------------------ Chemistries  Recent Labs  Lab 01/15/19 1409 01/16/19 0454  NA 138 140  K 4.5 4.2  CL 99 108  CO2 29 26  GLUCOSE 106* 161*  BUN 25* 23  CREATININE 1.13* 0.91  CALCIUM 8.8* 7.5*  AST 25  --   ALT 15  --   ALKPHOS 78  --   BILITOT 0.8  --    RADIOLOGY:  No results found. 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  1.  Acute hypoxic respiratory failure in the setting of pneumonia with  mild COPD exacerbation Patient was intubated and initially admitted to the ICU. Patient extubated on 01/16/2019.  Doing well.  Plans for transfer to the medical floor once bed available  2.  HCAP: Continue cefepime. Her most recent MRSA PCR was negative and therefore vancomycin was not  continued  3.  Acute encephalopathy in the setting of acute hypoxic respiratory failure with hypoxia: Resolved Awake and alert and oriented this morning. CT head with no acute findings.  4.  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  COPD exacerbation: Continue steroids and nebs  6.  Acute kidney injury in the setting of respiratory failure: Resolved with IV fluids Hold nephrotoxic medications  7   Essential hypertension: PRN hydralazine  DVT prophylaxis; Lovenox  All the records are reviewed and case discussed with ED provider. Management plans discussed with the patient;s family and they are in agreement     All the records are reviewed and case discussed with Care Management/Social Worker. Management plans discussed with the patient, family and they are in agreement.  CODE STATUS: Full Code  TOTAL TIME TAKING CARE OF THIS PATIENT: 27 minutes.   More than 50% of the time was spent in counseling/coordination of care: YES  POSSIBLE D/C IN 2-3 DAYS, DEPENDING ON CLINICAL CONDITION.   Shakila Mak M.D on 01/17/2019 at 1:24 PM  Between 7am to 6pm - Pager - [phone]  After 6pm go to [email or web address] - Social research officer, government  [facility] [place] [facility]  Office  [phone]  CC: Primary care physician; Malva Limes, MD  Note: This dictation was prepared with Dragon dictation along with smaller phrase technology. Any transcriptional errors that result from this process are unintentional.

## 2019-01-17 NOTE — Progress Notes (Signed)
Alert on assessment. Confused, does not respond to questions, not following commands, grunts and shakes her head no when asked to squeeze my hand. Active movement in all four extremities. Shakes her head no when asked if she is having any pain or shortness of breath. Shakes her head yes when asked if slept well and did she have a good night. Family at the bedside, states that she has not been speaking throughout the night. Will continue to monitor.

## 2019-01-17 NOTE — Progress Notes (Signed)
Alert, sitting up in bed watch TV. Family at the bedside. Remains on 4L [place]. SPO2 @ 95%. Denies SOB, breathing unlabored. Denies having pain at this time. Pt remains nonverbal throughout the shift, only responding by shaking her head yes or no. continues to refuses mobility. BP 169/75, will treat with PRN Hydralazine, see MAR.

## 2019-01-17 NOTE — Progress Notes (Signed)
PCCM PROGRESS NOTE  Requesting MD/Service: Mody Date of initial consultation: 01/15/19 Reason for consultation: VDRF  PT PROFILE: 68 y.o. female smoker with multiple chronic medical problems and history of recurrent pneumonias presented to [facility] ED 01/15/19 with fever, unresponsiveness, hypoxemia requiring intubation in ED.  MAJOR EVENTS/TEST RESULTS: 02/21 admission as above 02/21 CTA head: No acute findings 02/22 Extubated 02/23 Transfer to MedSurg  INDWELLING DEVICES:: ETT 02/21 >> 02/22   MICRO DATA: Resp 02/21 >> few Klebsiella Blood 02/21 >> NGTD  ANTIMICROBIALS:  Vanc 02/21 >> 02/21 Metronidazole 02/21 >> 02/21 Cefepime 02/21 >>    24 INTERVAL: No major events   SUBJ:  Tolerating extubation.  Somewhat avoidant during the encounter but family states that she is conversant and seems cognitively intact.  No distress presently.   Vitals:   01/17/19 0600 01/17/19 0700 01/17/19 0800 01/17/19 0900  BP: (!) 157/86 (!) 176/91 (!) 161/83   Pulse: 85 75 78 89  Resp:      Temp: (!) 97.5 F (36.4 C) (!) 97.2 F (36.2 C) (!) 97.3 F (36.3 C) (!) 97.2 F (36.2 C)  TempSrc:   Bladder   SpO2: 97% 98% 96% (!) 83%  Weight:      Height:      2 LPM [place]  EXAM:  Gen: Frail-appearing, NAD HEENT: NCAT, sclera white Neck: No JVD noted Lungs: Diminished breath sounds, no wheezes anteriorly Cardiovascular: RRR, no murmurs Abdomen: Soft, nontender, normal BS Ext: without clubbing, cyanosis, edema Neuro: grossly intact Skin: Limited exam, no lesions noted   DATA:   BMP Latest Ref Rng & Units 01/16/2019 01/15/2019 01/02/2019  Glucose 70 - 99 mg/dL 063(K) 160(F) 76  BUN 8 - 23 mg/dL 23 09(N) 11  Creatinine 0.44 - 1.00 mg/dL 2.35 5.73(U) 2.02  BUN/Creat Ratio 12 - 28 - - -  Sodium 135 - 145 mmol/L 140 138 141  Potassium 3.5 - 5.1 mmol/L 4.2 4.5 3.9  Chloride 98 - 111 mmol/L 108 99 108  CO2 22 - 32 mmol/L 26 29 26   Calcium 8.9 - 10.3 mg/dL 7.5(L) 8.8(L) 7.6(L)    CBC  Latest Ref Rng & Units 01/16/2019 01/15/2019 01/02/2019  WBC 4.0 - 10.5 K/uL 18.5(H) 12.9(H) 9.3  Hemoglobin 12.0 - 15.0 g/dL 10.8(L) 12.1 9.6(L)  Hematocrit 36.0 - 46.0 % 35.6(L) 39.4 31.9(L)  Platelets 150 - 400 K/uL 211 230 208    CXR: No new film  I have personally reviewed all chest radiographs reported above including CXRs and CT chest unless otherwise indicated  IMPRESSION:   Acute on chronic hypoxemic respiratory failure Left lower lobe HCAP Ventilator dependence, resolved Recalcitrant smoker Severe sepsis, resolved Elevated PCT AKI, nonoliguric.  Resolving.  Baseline creatinine 0.62 Acute encephalopathy, appears to be resolved Chronic pain syndrome on Duragesic patch PTA   PLAN:  -Continue supplemental oxygen as needed to maintain SPO2 >90% -Monitor BMET intermittently -Monitor I/Os -Correct electrolytes as indicated -Monitor temp, WBC count -Micro and abx as above -If cultures remain negative, would complete 7-day course with Augmentin -Continue home dose of Duragesic patch -Continue low-dose fentanyl as needed -Advance diet and activity as able -SLP evaluation requested.  Notably, she refused evaluation last hospitalization -Mobilize.  OOB.  Ambulate with assistance.  PT eval requested -Palliative care consultation requested -Transfer to MedSurg floor -Discharge planning.  She might be benefited by SNF rehab facility  Husband updated at bedside.  I offered that I would be happy to see her in follow-up in the office.  However, patient  seems reluctant to participate with medical guidance and therefore, I will leave it up to them to make the decision whether they want to follow-up with me in the office or not   Billy Fischer, MD PCCM service Mobile [phone] Pager [phone] 01/17/2019 1:07 PM

## 2019-01-17 NOTE — Progress Notes (Signed)
SPO2 88% sustained. Increased [place] to 4L per RTT. Will continue to monitor.

## 2019-01-17 NOTE — Progress Notes (Signed)
Refusing to take PO medications and food and  fluids at this time. Patient's family at the bedside. Education on necessity of medication provided and other food and drink options provided per diet. Pt continued to refuse by shaking her head and turning her head. Pt also refuses mobility and has refused mobility throughout the shift. Pt has been supine and in a semi folwers position throughout the day per pt request.  Will continue to monitor.

## 2019-01-17 NOTE — Progress Notes (Signed)
Alert on assessment, but only responds with nodding. Pt denies pain and SOB at this time. elevated BP 184/83, Dr. Bard Herbert notified. See new orders. Will continue to monitor.

## 2019-01-17 NOTE — Consult Note (Signed)
Pharmacy Antibiotic Note  Cheryl [name] is a 68 y.o. female admitted on 01/15/2019 with sepsis.  Pharmacy has been consulted for cefepime dosing. She has several recent admissions here at [facility] but has not received vancomycin long enough for levels to have been drawn. The most recent admission was approximately 2 weeks ago for PNA. CXR today shows possible PNA.  Plan:  Continue cefepime 2 grams IV every 12 hours  Height: 4\' 10"  (147.3 cm) Weight: 121 lb 14.6 oz (55.3 kg) IBW/kg (Calculated) : 40.9  Temp (24hrs), Avg:97.7 F (36.5 C), Min:97.2 F (36.2 C), Max:98.8 F (37.1 C)  Recent Labs  Lab 01/15/19 1409 01/16/19 0454  WBC 12.9* 18.5*  CREATININE 1.13* 0.91  LATICACIDVEN 2.6*  --     Estimated Creatinine Clearance: 43 mL/min (by C-G formula based on SCr of 0.91 mg/dL).    Allergies  Allergen Reactions  . Percocet [Oxycodone-Acetaminophen] Hives and Rash  . Aspirin Nausea And Vomiting and Other (See Comments)  . Codeine Nausea And Vomiting, Nausea Only and Other (See Comments)  . Propoxyphene Nausea Only and Other (See Comments)  . Sulfa Antibiotics Rash and Other (See Comments)    Antimicrobials this admission: vancomycin 2/21 x1  cefepime 2/21 >>  metronidazole 2/21 x 1  Microbiology results: 2/21 BCx: NG x 2 days 2/21 influenza: negative 2/21 Resp Cx: Klebsiella   Thank you for allowing pharmacy to be a part of this patient's care.  Gardner Candle, PharmD, BCPS Clinical Pharmacist 01/17/2019 2:35 PM

## 2019-01-18 ENCOUNTER — Inpatient Hospital Stay: Payer: Medicare HMO

## 2019-01-18 LAB — BASIC METABOLIC PANEL
Anion gap: 7 (ref 5–15)
BUN: 27 mg/dL — ABNORMAL HIGH (ref 8–23)
CHLORIDE: 109 mmol/L (ref 98–111)
CO2: 26 mmol/L (ref 22–32)
Calcium: 7.9 mg/dL — ABNORMAL LOW (ref 8.9–10.3)
Creatinine, Ser: 0.63 mg/dL (ref 0.44–1.00)
GFR calc Af Amer: >60 mL/min (ref >60)
GFR calc non Af Amer: >60 mL/min (ref >60)
Glucose, Bld: 95 mg/dL (ref 70–99)
Potassium: 3.3 mmol/L — ABNORMAL LOW (ref 3.5–5.1)
Sodium: 142 mmol/L (ref 135–145)

## 2019-01-18 LAB — CBC
HCT: 30 % — ABNORMAL LOW (ref 36.0–46.0)
Hemoglobin: 9.4 g/dL — ABNORMAL LOW (ref 12.0–15.0)
MCH: 30.5 pg (ref 26.0–34.0)
MCHC: 31.3 g/dL (ref 30.0–36.0)
MCV: 97.4 fL (ref 80.0–100.0)
Platelets: 180 10*3/uL (ref 150–400)
RBC: 3.08 MIL/uL — ABNORMAL LOW (ref 3.87–5.11)
RDW: 14.6 % (ref 11.5–15.5)
WBC: 11.2 10*3/uL — ABNORMAL HIGH (ref 4.0–10.5)
nRBC: 0 % (ref 0.0–0.2)

## 2019-01-18 LAB — PHOSPHORUS: Phosphorus: 2 mg/dL — ABNORMAL LOW (ref 2.5–4.6)

## 2019-01-18 LAB — MAGNESIUM: Magnesium: 2.2 mg/dL (ref 1.7–2.4)

## 2019-01-18 MED ORDER — PROMETHAZINE HCL 25 MG/ML IJ SOLN
12.5000 mg | Freq: Four times a day (QID) | INTRAMUSCULAR | Status: DC | PRN
  Administered 2019-01-18: 12.5 mg via INTRAVENOUS
  Filled 2019-01-18: qty 1

## 2019-01-18 MED ORDER — POTASSIUM CHLORIDE CRYS ER 20 MEQ PO TBCR
40.0000 meq | EXTENDED_RELEASE_TABLET | Freq: Once | ORAL | Status: AC
  Administered 2019-01-18: 40 meq via ORAL
  Filled 2019-01-18: qty 2

## 2019-01-18 MED ORDER — K PHOS MONO-SOD PHOS DI & MONO 155-852-130 MG PO TABS
500.0000 mg | ORAL_TABLET | Freq: Two times a day (BID) | ORAL | Status: DC
  Administered 2019-01-18 – 2019-01-20 (×5): 500 mg via ORAL
  Filled 2019-01-18 (×6): qty 2

## 2019-01-18 NOTE — Progress Notes (Addendum)
Per [facility] admissions coordinator at [place] he can accept patient and will start [facility] SNF authorization today. Patient and her husband Homero Fellers are aware of above. Homero Fellers accepted bed offer from [place] however he is still hoping to take patient home if he can. RN case manager aware of above.  [employer], LCSW [phone]

## 2019-01-18 NOTE — Plan of Care (Signed)
  Problem: Education: Goal: Knowledge of General Education information will improve Description Including pain rating scale, medication(s)/side effects and non-pharmacologic comfort measures Outcome: Progressing   

## 2019-01-18 NOTE — Evaluation (Signed)
Physical Therapy Evaluation Patient Details Name: Cheryl [name] MRN: [id number] DOB: 10/24/1951 Today's Date: 01/18/2019   History of Present Illness  Cheryl [name] is a 68yo female who comes to [facility] on 2/21 c hypoxia, admitted with PNA adn COPD exacerbation. PMH: NSTEMI, COPD, DJD, CHF.   Clinical Impression  Pt admitted with above diagnosis. Pt currently with functional limitations due to the deficits listed below (see "PT Problem List"). Upon entry, pt in bed, husband present for 2nd half. The pt is awake and agreeable to participate. Appears significantly weak, unable to mobilize to EOB without max-total assist, and tolerates only ~3 minutes seated at EOB before collapsing into side lying saying 'I need to lie down' 3x. Pt does not further qualify her current limitations, but does deny pain and reports high motivation. Functional mobility assessment demonstrates increased effort/time requirements, poor tolerance, and need for physical assistance, whereas the patient performed these at a higher level of independence PTA. Pt reports she has had multiple hospitalizations recently and not been able to bounce back to baseline level, hence the current preferred plan from pt/family, may not be enough to rehab the patient fully. She would benefit from a higher frequency of rehab services offered only in a skilled rehab setting. Pt will benefit from skilled PT intervention to increase independence and safety with basic mobility in preparation for discharge to the venue listed below.       Follow Up Recommendations SNF;Supervision/Assistance - 24 hour;Supervision for mobility/OOB    Equipment Recommendations  None recommended by PT    Recommendations for Other Services       Precautions / Restrictions Precautions Precautions: Fall Restrictions Weight Bearing Restrictions: No      Mobility  Bed Mobility Overal bed mobility: Needs Assistance Bed Mobility: Supine to Sit     Supine to sit: Total  assist     General bed mobility comments: profoundly weak, unable to put forth much effort, despite cuing and allowing for adequate time.   Transfers                 General transfer comment: Pt unwilling to attempt, reports need to return to supine after 2 minutes sitting EOB.   Ambulation/Gait                Stairs            Wheelchair Mobility    Modified Rankin (Stroke Patients Only)       Balance Overall balance assessment: Needs assistance Sitting-balance support: Feet supported Sitting balance-Leahy Scale: Good                                       Pertinent Vitals/Pain Pain Assessment: No/denies pain    Home Living Family/patient expects to be discharged to:: Private residence Living Arrangements: Spouse/significant other Available Help at Discharge: Family;Available 24 hours/day Type of Home: House Home Access: Ramped entrance     Home Layout: One level Home Equipment: Wheelchair - [employer] - 2 wheels;Bedside commode;Shower seat - built in Additional Comments: Walks limited community distances.    Prior Function Level of Independence: Needs assistance   Gait / Transfers Assistance Needed: Ambulating with rollator mostly in house, but was accessing community weekly prior to illness.   ADL's / Homemaking Assistance Needed: Assistance with bathing; needs help getting in and out of shower. (has in home aid 3 days weekly)  Hand Dominance   Dominant Hand: Right    Extremity/Trunk Assessment   Upper Extremity Assessment Upper Extremity Assessment: Generalized weakness    Lower Extremity Assessment Lower Extremity Assessment: Generalized weakness    Cervical / Trunk Assessment Cervical / Trunk Assessment: Kyphotic  Communication      Cognition Arousal/Alertness: Awake/alert Behavior During Therapy: WFL for tasks assessed/performed Overall Cognitive Status: Within Functional Limits for tasks  assessed                                        General Comments      Exercises     Assessment/Plan    PT Assessment Patient needs continued PT services  PT Problem List Decreased strength;Decreased activity tolerance;Decreased mobility       PT Treatment Interventions DME instruction;Therapeutic exercise;Gait training;Balance training;Functional mobility training;Therapeutic activities;Patient/family education    PT Goals (Current goals can be found in the Care Plan section)  Acute Rehab PT Goals Patient Stated Goal: Rest until she feels better PT Goal Formulation: With patient Time For Goal Achievement: 02/01/19 Potential to Achieve Goals: Good    Frequency Min 2X/week   Barriers to discharge        Co-evaluation               AM-PAC PT "6 Clicks" Mobility  Outcome Measure Help needed turning from your back to your side while in a flat bed without using bedrails?: A Lot Help needed moving from lying on your back to sitting on the side of a flat bed without using bedrails?: Total Help needed moving to and from a bed to a chair (including a wheelchair)?: Total Help needed standing up from a chair using your arms (e.g., wheelchair or bedside chair)?: Total Help needed to walk in hospital room?: Total Help needed climbing 3-5 steps with a railing? : Total 6 Click Score: 7    End of Session Equipment Utilized During Treatment: Oxygen Activity Tolerance: Patient limited by fatigue Patient left: in bed;with bed alarm set;with call bell/phone within reach;with family/visitor present Nurse Communication: Mobility status PT Visit Diagnosis: Muscle weakness (generalized) (M62.81);Difficulty in walking, not elsewhere classified (R26.2)    Time: 1359(out of room during breathign treatment )-1439 PT Time Calculation (min) (ACUTE ONLY): 40 min   Charges:   PT Evaluation $PT Eval Moderate Complexity: 1 Mod          2:55 PM, 01/18/19 Rosamaria Lints, PT, DPT Physical Therapist - [facility]  [phone] (ASCOM)    Buccola,Allan C 01/18/2019, 2:51 PM

## 2019-01-18 NOTE — Progress Notes (Signed)
SLP Cancellation Note  Patient Details Name: Cheryl [name] MRN: [id number] DOB: Mar 02, 1951   Cancelled treatment:       Reason Eval/Treat Not Completed: Patient declined, no reason specified(chart reviewed; consulted pt/husband and MD/NSG). Pt sitting in bed having consumed a Cup of water given to her by husband while watching TV. Pt and Husband denied any difficulty swallowing the cup of water - no outward coughing or choking. Pt would NOT drink any further water or take any po's w/ SLP or NSG despite encouragement. At this point, pt hiccupped/belched then coughed to spit out stringy, thick appearing phlegm. Pt refused any po medications w/ NSG as well. Due to pt's h/o GI complaints of dysmotility, MD was consulted for a Barium Study to assess for any potential Esophageal dysmotility - pt has a dx of Hiatal Hernia per chart notes per a CT of Abd. She has declined some GI intervention/assessments per chart notes.  ST services will f/u w/ pt's swallowing status tomorrow post GI Barium study. MD agreed; NSG updated.  Recommend Reflux and aspiration precautions w/ any oral intake.     Jerilynn Som, MS, CCC-SLP Kesi Perrow 01/18/2019, 10:58 AM

## 2019-01-18 NOTE — Progress Notes (Signed)
Please note patient is currently followed by outpatient Palliative at home. CMRN Deliliah Earl Lites and CSW Fredric Mare Sample made aware.  Dayna Barker BSN, RN [facility] [place], clinical Liaison [phone]

## 2019-01-18 NOTE — Care Management Important Message (Signed)
Important Message  Patient Details  Name: Cheryl [name] MRN: [id number] Date of Birth: 06-Jan-1951   Medicare Important Message Given:  Yes    [name] A Orphia Mctigue 01/18/2019, 12:00 PM

## 2019-01-18 NOTE — Progress Notes (Signed)
Left for chest xray

## 2019-01-18 NOTE — Progress Notes (Signed)
[facility]   PATIENT NAME: Cheryl [name]    MR#:  [id number]  DATE OF BIRTH:  [birth year]  SUBJECTIVE:  CHIEF COMPLAINT:   Chief Complaint  Patient presents with  . Respiratory Distress  . Altered Mental Status   Patient admitted with acute hypoxic respiratory failure secondary to pneumonia and COPD exacerbation.  Was intubated and admitted to ICU.  Patient subsequently extubated on 01/16/2019 and transferred out of ICU .  Awake and alert and oriented this morning with no complaints.  No fevers.  Seen by speech therapist and recommendation was for modified barium swallow which has been ordered.  Updated husband present at bedside.  REVIEW OF SYSTEMS:  Review of Systems  Constitutional: Negative for chills and fever.  HENT: Negative for hearing loss and tinnitus.   Eyes: Negative for blurred vision and double vision.  Respiratory: Negative for cough and shortness of breath.   Cardiovascular: Negative for chest pain and palpitations.  Gastrointestinal: Negative for heartburn, nausea and vomiting.  Genitourinary: Negative for dysuria and urgency.  Musculoskeletal: Negative for myalgias and neck pain.  Skin: Negative for itching and rash.  Neurological: Negative for dizziness and headaches.  Psychiatric/Behavioral: Negative for depression and hallucinations.    DRUG ALLERGIES:   Allergies  Allergen Reactions  . Percocet [Oxycodone-Acetaminophen] Hives and Rash  . Aspirin Nausea And Vomiting and Other (See Comments)  . Codeine Nausea And Vomiting, Nausea Only and Other (See Comments)  . Propoxyphene Nausea Only and Other (See Comments)  . Sulfa Antibiotics Rash and Other (See Comments)   VITALS:  Blood pressure (!) 143/72, pulse 77, temperature 98.3 F (36.8 C), temperature source Oral, resp. rate 20, height  (1.473 m), weight 55.3 kg, SpO2 94 %. PHYSICAL EXAMINATION:  Physical Exam  Constitutional: She is oriented to person,  place, and time. She appears well-developed and well-nourished.  HENT:  Head: Normocephalic and atraumatic.  Right Ear: External ear normal.  Eyes: Pupils are equal, round, and reactive to light. Conjunctivae and EOM are normal. Right eye exhibits no discharge.  Neck: Normal range of motion. Neck supple. No tracheal deviation present.  Cardiovascular: Normal rate, regular rhythm and normal heart sounds.  Respiratory: Effort normal and breath sounds normal. She has no wheezes.  GI: Soft. Bowel sounds are normal.  Musculoskeletal: Normal range of motion.        General: No edema.  Neurological: She is alert and oriented to person, place, and time. She has normal reflexes. No cranial nerve deficit.  Skin: Skin is warm. No erythema.  Psychiatric: She has a normal mood and affect. Her behavior is normal.    LABORATORY PANEL:  Female CBC Recent Labs  Lab 01/18/19 0457  WBC 11.2*  HGB 9.4*  HCT 30.0*  PLT 180   ------------------------------------------------------------------------------------------------------------------ Chemistries  Recent Labs  Lab 01/15/19 1409  01/18/19 0457  NA 138   < > 142  K 4.5   < > 3.3*  CL 99   < > 109  CO2 29   < > 26  GLUCOSE 106*   < > 95  BUN 25*   < > 27*  CREATININE 1.13*   < > 0.63  CALCIUM 8.8*   < > 7.9*  MG  --   --  2.2  AST 25  --   --   ALT 15  --   --   ALKPHOS 78  --   --   BILITOT 0.8  --   --    < > =  values in this interval not displayed.   RADIOLOGY:  Dg Chest 2 View  Result Date: 01/18/2019 CLINICAL DATA:  Community-acquired pneumonia. EXAM: CHEST - 2 VIEW COMPARISON:  Radiograph of January 15, 2019. FINDINGS: Stable cardiomegaly with central pulmonary vascular congestion. No pneumothorax is noted. No significant pleural effusion is noted. Endotracheal and nasogastric tubes have been removed. Stable left basilar atelectasis or infiltrate is noted. Right lung is unremarkable. Bony thorax appears normal. IMPRESSION:  Endotracheal and nasogastric tubes have been removed. Stable left basilar atelectasis or infiltrate is noted. Electronically Signed   By: Lupita Raider, M.D.   On: 01/18/2019 08:31   Dg Esophagus W Double Cm (hd)  Result Date: 01/18/2019 CLINICAL DATA:  Dysphagia EXAM: ESOPHOGRAM/BARIUM SWALLOW TECHNIQUE: Single contrast examination was performed using thin barium or water soluble. FLUOROSCOPY TIME:  Fluoroscopy Time: 1 min 30 seconds Radiation Exposure Index (if provided by the fluoroscopic device): 10.3 mGy Number of Acquired Spot Images: 0 COMPARISON:  None. FINDINGS: Extremely limited examination secondary to difficulty in patient positioning and inability of patient to stand. Patient is extremely nauseous with even the smallest sip of barium. There was normal pharyngeal anatomy and motility. Contrast flowed freely through the esophagus without evidence of stricture or mass. There was grossly normal esophageal mucosa without evidence of irregularity or ulceration. Intermittent tertiary contractions of the distal third of the esophagus as can be seen with mild spasm. No evidence of reflux. Small hiatal hernia. IMPRESSION: Extremely limited examination secondary to difficulty in patient positioning and inability of patient to stand. Patient is extremely nauseous with even the smallest sip of barium. No evidence of an esophageal stricture. Intermittent tertiary contractions of the distal third of the esophagus as can be seen with mild spasm. 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  1.  Acute hypoxic respiratory failure in the setting of pneumonia with mild COPD exacerbation Patient was intubated and  initially admitted to the ICU. Patient extubated on 01/16/2019 and transferred out of ICU.  Doing well.  2.  HCAP: Continue cefepime. Her most recent MRSA PCR was negative and therefore vancomycin was not continued Patient seen by speech therapist.  Due to concern for potential esophageal dysmotility and patient having history of hiatal hernia on prior imaging as well as patient declining GI intervention and assessment per review of previous chart, speech therapist recommended modified barium swallow which has been ordered. Aspiration precautions  3.  Acute encephalopathy in the setting of acute hypoxic respiratory failure with hypoxia: Resolved Awake and alert and oriented this morning. CT head with no acute findings.  4.  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  COPD exacerbation: Continue steroids and nebs  6.  Acute kidney injury in the setting of respiratory failure: Resolved with IV fluids Hold nephrotoxic medications  7   Essential hypertension: PRN hydralazine  DVT prophylaxis; Lovenox Physical therapy to evaluate and treat  All the records are reviewed and case discussed with ED provider. Management plans discussed with the patient;s family and they are in agreement     All the records are reviewed and case discussed with Care Management/Social Worker. Management plans discussed with the patient, family and they  are in agreement.  CODE STATUS: Full Code  TOTAL TIME TAKING CARE OF THIS PATIENT: 28 minutes.   More than 50% of the time was spent in counseling/coordination of care: YES  POSSIBLE D/C IN 2-3 DAYS, DEPENDING ON CLINICAL CONDITION.   Hamda Klutts M.D on 01/18/2019 at 12:36 PM  Between 7am to 6pm - Pager - [phone]  After 6pm go to [email or web address] - Social research officer, government  [facility]  Office  [phone]  CC: Primary care physician; Malva Limes, MD  Note:  This dictation was prepared with Dragon dictation along with smaller phrase technology. Any transcriptional errors that result from this process are unintentional.

## 2019-01-18 NOTE — Clinical Social Work Note (Signed)
Clinical Social Work Assessment  Patient Details  Name: Cheryl [name] MRN: [id number] Date of Birth: 1951/06/21  Date of referral:  01/18/19               Reason for consult:  Facility Placement                Permission sought to share information with:  Chartered certified accountant granted to share information::  Yes, Verbal Permission Granted  Name::      [place]::   [place]   Relationship::     Contact Information:     Housing/Transportation Living arrangements for the past 2 months:  [place] of Information:  Patient, Adult Children, Spouse Patient Interpreter Needed:  None Criminal Activity/Legal Involvement Pertinent to Current Situation/Hospitalization:  No - Comment as needed Significant Relationships:  Adult Children, Spouse Lives with:  Spouse Do you feel safe going back to the place where you live?  Yes Need for family participation in patient care:  Yes (Comment)  Care giving concerns:  Patient lives in [place] with her husband [name] [phone].    Social Worker assessment / plan:  Holiday representative (CSW) reviewed chart and noted that PT is recommending SNF. CSW met with patient and her husband [name], daughter Lynelle Smoke and son in law [place] were at bedside. Patient was alert and oriented X2 and was laying in the bed. CSW introduced self and explained role of CSW department. Per husband they live in [facility] and patient has been to [place] several times for rehab. CSW explained SNF process and that Mcarthur Rossetti will have to approve SNF. Husband reported that he prefers to take patient home but is open to [facility] for rehab. FL2 complete and faxed out. CSW will continue to follow and assist as needed.   Employment status:  Disabled (Comment on whether or not currently receiving Disability), Retired Forensic scientist:  Medicaid In [place], [employer] PT Recommendations:  [place] / Referral to community resources:  [place]  Patient/Family's Response to care:  Patient's husband is agreeable to [facility] in [place].   Patient/Family's Understanding of and Emotional Response to Diagnosis, Current Treatment, and Prognosis:  Patient and her husband were very pleasant and thanked CSW for assistance.   Emotional Assessment Appearance:  Appears stated age Attitude/Demeanor/Rapport:    Affect (typically observed):  Accepting, Adaptable, Pleasant Orientation:  Oriented to Self, Oriented to Place, Oriented to  Time, Oriented to Situation Alcohol / Substance use:  Not Applicable Psych involvement (Current and /or in the community):  No (Comment)  Discharge Needs  Concerns to be addressed:  Discharge Planning Concerns Readmission within the last 30 days:  No Current discharge risk:  Dependent with Mobility Barriers to Discharge:  Continued Medical Work up   [employer], Veronia Beets, LCSW 01/18/2019, 3:22 PM

## 2019-01-18 NOTE — Care Management (Signed)
Met with patient in the room, Physical Therapy is in the room working with the patient, I left the [facility] list at the bedside and agreed to come back and discuss the DC plan after PT is finished working with the patient and making the receommendation

## 2019-01-18 NOTE — Consult Note (Signed)
Pharmacy Antibiotic Note  Cheryl [name] is a 68 y.o. female admitted on 01/15/2019 with sepsis.  Pharmacy has been consulted for cefepime dosing. She has several recent admissions here at [facility] but has not received vancomycin long enough for levels to have been drawn. The most recent admission was approximately 2 weeks ago for PNA. CXR today shows possible PNA.  Plan:  Continue cefepime 2 grams IV every 12 hours  Height: 4\' 10"  (147.3 cm) Weight: 121 lb 14.6 oz (55.3 kg) IBW/kg (Calculated) : 40.9  Temp (24hrs), Avg:98.3 F (36.8 C), Min:97.7 F (36.5 C), Max:98.6 F (37 C)  Recent Labs  Lab 01/15/19 1409 01/16/19 0454 01/18/19 0457  WBC 12.9* 18.5* 11.2*  CREATININE 1.13* 0.91 0.63  LATICACIDVEN 2.6*  --   --     Estimated Creatinine Clearance: 48.9 mL/min (by C-G formula based on SCr of 0.63 mg/dL).    Allergies  Allergen Reactions  . Percocet [Oxycodone-Acetaminophen] Hives and Rash  . Aspirin Nausea And Vomiting and Other (See Comments)  . Codeine Nausea And Vomiting, Nausea Only and Other (See Comments)  . Propoxyphene Nausea Only and Other (See Comments)  . Sulfa Antibiotics Rash and Other (See Comments)    Antimicrobials this admission: vancomycin 2/21 x1  cefepime 2/21 >>  metronidazole 2/21 x 1  Microbiology results: 2/21 BCx: NG x 2 days 2/21 influenza: negative 2/21 Resp Cx: Klebsiella, H.influenza   Thank you for allowing pharmacy to be a part of this patient's care.  Angelique Blonder, PharmD, BCPS Clinical Pharmacist 01/18/2019 4:32 PM

## 2019-01-18 NOTE — Clinical Social Work Placement (Signed)
   CLINICAL SOCIAL WORK PLACEMENT  NOTE  Date:  01/18/2019  Patient Details  Name: Cheryl [name] MRN: [id number] Date of Birth: [birth year]  Clinical Social Work is seeking post-discharge placement for this patient at the Skilled  Nursing Facility level of care (*CSW will initial, date and re-position this form in  chart as items are completed):  Yes   Patient/family provided with [facility] Clinical Social Work Department's list of facilities offering this level of care within the geographic area requested by the patient (or if unable, by the patient's family).  Yes   Patient/family informed of their freedom to choose among providers that offer the needed level of care, that participate in Medicare, Medicaid or managed care program needed by the patient, have an available bed and are willing to accept the patient.  Yes   Patient/family informed of [facility]'s ownership interest in [facility] and [facility], as well as of the fact that they are under no obligation to receive care at these facilities.  PASRR submitted to EDS on       PASRR number received on       Existing PASRR number confirmed on 01/18/19     FL2 transmitted to all facilities in geographic area requested by pt/family on 01/18/19     FL2 transmitted to all facilities within larger geographic area on       Patient informed that his/her managed care company has contracts with or will negotiate with certain facilities, including the following:            Patient/family informed of bed offers received.  Patient chooses bed at       Physician recommends and patient chooses bed at      Patient to be transferred to   on  .  Patient to be transferred to facility by       Patient family notified on   of transfer.  Name of family member notified:        PHYSICIAN       Additional Comment:    _______________________________________________ Marlon Vonruden, Darleen Crocker, LCSW 01/18/2019, 3:21 PM

## 2019-01-18 NOTE — [place] FL2 (Signed)
[place] MEDICAID FL2 LEVEL OF CARE SCREENING TOOL     IDENTIFICATION  Patient Name: Cheryl [name] Birthdate: January 09, 1951 Sex: female Admission Date (Current Location): 01/15/2019  [place] and [place] Number:  [address] ([id number] [facility]) Facility and Address:  [facility] [address] [place], [place] [address]      Provider Number: [id number]  Attending Physician Name and Address:  Jama Flavors, MD  Relative Name and Phone Number:       Current Level of Care: Hospital Recommended Level of Care: Skilled Nursing Facility Prior Approval Number:    Date Approved/Denied:   PASRR Number: [id number])  Discharge Plan: SNF    Current Diagnoses: Patient Active Problem List   Diagnosis Date Noted  . Acute respiratory failure (HCC) 01/15/2019  . Aspiration pneumonia (HCC) 01/01/2019  . Hiatal hernia 12/24/2018  . Sepsis (HCC) 11/03/2018  . Fever   . Respiratory distress   . NSTEMI (non-ST elevated myocardial infarction) (HCC)   . Chronic lower extremity pain (Primary Area of Pain) (Bilateral) (L>R) 09/03/2018  . Chronic hip pain (Fourth Area of Pain) (Bilateral) (L>R) 09/03/2018  . Osteoarthritis of hip (Bilateral) 09/03/2018  . Chronic sacroiliac joint pain [facility] Area of Pain) (Bilateral) (L>R) 09/03/2018  . Sacroiliac joint dysfunction (Bilateral) 09/03/2018  . Spondylosis without myelopathy or radiculopathy, lumbosacral region 09/03/2018  . Other specified dorsopathies, sacral and sacrococcygeal region 09/03/2018  . Disorder of skeletal system 09/02/2018  . Problems influencing health status 09/02/2018  . DDD (degenerative disc disease), lumbar 09/02/2018  . Abnormal CT scan, lumbar spine (08/04/2017) 09/02/2018  . Levoscoliosis (Severe) 09/02/2018  . 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 Lumbar Grade 1 Anterolisthesis of L3 over L4 and L4 over L5 09/02/2018  . Lumbar foraminal stenosis (Bilateral:  L3-4, L4-5) (Severe Right L4-5) 09/02/2018  . Lumbar facet arthropathy (Bilateral) 09/02/2018  . Lumbar facet osteoarthritis (Bilateral) 09/02/2018  . Lumbar spondylosis 09/02/2018  . Cervical spondylosis 09/02/2018  . Thoracic spondylosis 09/02/2018  . DDD (degenerative disc disease), thoracic 09/02/2018  . DDD (degenerative disc disease), cervical 09/02/2018  . Pharmacologic therapy 09/01/2018  . Spondylosis without myelopathy or radiculopathy, lumbar region 09/01/2018  . Anorexia 07/07/2018  . 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 Goals of care, counseling/discussion   . Palliative care by specialist   . Acute encephalopathy 06/18/2018  . Iron deficiency anemia 06/09/2018  . Gastritis without bleeding   . Hypoxia 05/14/2018  . Generalized weakness 02/08/2018  . Marijuana use 06/05/2017  . Pneumonia 04/29/2017  . Metabolic encephalopathy 12/23/2016  . Hypokalemia 12/23/2016  . Protein-calorie malnutrition, severe 11/06/2016  . Nausea with vomiting   . Lumbar facet syndrome (Bilateral) (L>R) 03/20/2016  . Opiate use (160 MME/Day) 02/28/2016  . Anemia 02/28/2016  . Osteoarthrosis 12/18/2015  . Long term current use of anticoagulant therapy (Plavix) 12/18/2015  . Long term current use of opiate analgesic 12/04/2015  . Fibromyalgia 12/04/2015  . Dysphagia 11/24/2015  . Acute diastolic CHF (congestive heart failure) (HCC) 11/24/2015  . Leukocytosis 11/24/2015  . Abnormal mammogram of right breast 10/11/2015  . Dilated intrahepatic bile duct 10/11/2015  . Chronic lumbar radicular pain (Bilateral) (L>R) (L5) 09/04/2015  . Chronic low back pain (Secondary Area of Pain) (Bilateral) (L>R) 09/04/2015  . Encounter for therapeutic drug level monitoring 09/04/2015  . Uncomplicated opioid dependence (HCC) 09/04/2015  . Chronic pain syndrome 09/04/2015  . Platelet inhibition due to Plavix 09/04/2015  . COPD with acute exacerbation (HCC) 07/31/2015  .  Dementia (HCC) 07/31/2015   . Airway hyperreactivity 07/03/2015  . Chronic thoracic back pain 07/03/2015  . Excessive falling 07/03/2015  . Alteration in bowel elimination: incontinence 07/03/2015  . Insomnia 07/03/2015  . Decreased testosterone level 07/03/2015  . Leg weakness 07/03/2015  . Menopausal symptom 07/03/2015  . Migraine 07/03/2015  . Neuropathy (HCC) 07/03/2015  . Fecal occult blood test positive 07/03/2015  . OP (osteoporosis) 07/03/2015  . Panic disorder 07/03/2015  . Shortness of breath 07/03/2015  . Compulsive tobacco user syndrome 07/03/2015  . Urinary incontinence 07/03/2015  . Weight loss 07/03/2015  . Essential hypertension 06/06/2015  . GERD (gastroesophageal reflux disease) 06/06/2015  . Hyperlipemia 06/06/2015  . Peripheral nerve disease 04/13/2014  . Anxiety 02/10/2014  . Coronary artery disease 02/10/2014  . Peripheral vascular disease (HCC) 02/10/2014  . Vitamin D deficiency 10/16/2009    Orientation RESPIRATION BLADDER Height & Weight     Self, Place  O2(2 Liters Oxygen. ) Continent Weight: 121 lb 14.6 oz (55.3 kg) Height:  4\' 10"  (147.3 cm)  BEHAVIORAL SYMPTOMS/MOOD NEUROLOGICAL BOWEL NUTRITION STATUS      Continent Diet(Diet: Regular )  AMBULATORY STATUS COMMUNICATION OF NEEDS Skin   Extensive Assist Verbally Normal                       Personal Care Assistance Level of Assistance  Bathing, Feeding, Dressing Bathing Assistance: Limited assistance Feeding assistance: Independent Dressing Assistance: Limited assistance     Functional Limitations Info  Sight, Hearing, Speech Sight Info: Adequate Hearing Info: Adequate Speech Info: Adequate    SPECIAL CARE FACTORS FREQUENCY  PT (By licensed PT), OT (By licensed OT)     PT Frequency: (5) OT Frequency: (5)            Contractures      Additional Factors Info  Code Status, Allergies Code Status Info: (Full Code. ) Allergies Info: (Percocet Oxycodone-acetaminophen, Aspirin, Codeine, Propoxyphene,  Sulfa Antibiotics)           Current Medications (01/18/2019):  This is the current hospital active medication list Current Facility-Administered Medications  Medication Dose Route Frequency Provider Last Rate Last Dose  . acetaminophen (TYLENOL) tablet 650 mg  650 mg Oral Q6H PRN Adrian SaranMody, Sital, MD       Or  . acetaminophen (TYLENOL) suppository 650 mg  650 mg Rectal Q6H PRN Mody, Sital, MD      . albuterol (PROVENTIL) (2.5 MG/3ML) 0.083% nebulizer solution 2.5 mg  2.5 mg Nebulization Q3H PRN Merwyn KatosSimonds, David B, MD      . amLODipine (NORVASC) tablet 5 mg  5 mg Oral Daily Merwyn KatosSimonds, David B, MD   5 mg at 01/18/19 0913  . atorvastatin (LIPITOR) tablet 40 mg  40 mg Oral q1800 Merwyn KatosSimonds, David B, MD      . budesonide (PULMICORT) nebulizer solution 0.25 mg  0.25 mg Nebulization Q12H Merwyn KatosSimonds, David B, MD   0.25 mg at 01/18/19 0732  . ceFEPIme (MAXIPIME) 2 g in sodium chloride 0.9 % 100 mL IVPB  2 g Intravenous Q12H Lowella BandyGrubb, Rodney D, RPH 200 mL/hr at 01/18/19 1353 2 g at 01/18/19 1353  . clopidogrel (PLAVIX) tablet 75 mg  75 mg Oral Daily Merwyn KatosSimonds, David B, MD   75 mg at 01/18/19 0913  . donepezil (ARICEPT) tablet 10 mg  10 mg Oral Daily Merwyn KatosSimonds, David B, MD      . DULoxetine (CYMBALTA) DR capsule 30 mg  30 mg Oral Daily Merwyn KatosSimonds, David B, MD  30 mg at 01/18/19 0913  . enoxaparin (LOVENOX) injection 40 mg  40 mg Subcutaneous Q24H Ojie, Jude, MD   40 mg at 01/17/19 2030  . fentaNYL (DURAGESIC) 50 MCG/HR 1 patch  1 patch Transdermal Q72H Merwyn Katos, MD   1 patch at 01/16/19 1405  . hydrALAZINE (APRESOLINE) injection 10-20 mg  10-20 mg Intravenous Q4H PRN Merwyn Katos, MD   20 mg at 01/17/19 1840  . ipratropium-albuterol (DUONEB) 0.5-2.5 (3) MG/3ML nebulizer solution 3 mL  3 mL Nebulization Q6H Merwyn Katos, MD   3 mL at 01/18/19 1419  . lactated ringers infusion   Intravenous Continuous Merwyn Katos, MD 50 mL/hr at 01/18/19 1511    . memantine (NAMENDA) tablet 5 mg  5 mg Oral BID Merwyn Katos, MD   5 mg at 01/18/19 0913  . methylPREDNISolone sodium succinate (SOLU-MEDROL) 40 mg/mL injection 40 mg  40 mg Intravenous Daily Merwyn Katos, MD   40 mg at 01/18/19 0913  . mirtazapine (REMERON SOL-TAB) disintegrating tablet 15 mg  15 mg Oral QHS Merwyn Katos, MD      . ondansetron [facility]) injection 4 mg  4 mg Intravenous Q6H PRN Adrian Saran, MD   4 mg at 01/18/19 0355  . pantoprazole (PROTONIX) EC tablet 40 mg  40 mg Oral BID Merwyn Katos, MD   40 mg at 01/18/19 0913  . phosphorus (K PHOS NEUTRAL) tablet 500 mg  500 mg Oral BID Enid Baas, Jude, MD   500 mg at 01/18/19 1304  . polyethylene glycol (MIRALAX / GLYCOLAX) packet 17 g  17 g Oral Daily PRN Mody, Sital, MD      . promethazine (PHENERGAN) injection 12.5 mg  12.5 mg Intravenous Q6H PRN Arnaldo Natal, MD   12.5 mg at 01/18/19 6270  . traMADol (ULTRAM) tablet 50 mg  50 mg Oral Q6H PRN Merwyn Katos, MD         Discharge Medications: Please see discharge summary for a list of discharge medications.  Relevant Imaging Results:  Relevant Lab Results:   Additional Information (SSN: [id number])  Briton Sellman, Darleen Crocker, LCSW

## 2019-01-18 NOTE — Care Management Note (Signed)
Case Management Note  Patient Details  Name: Cheryl [name] MRN: [id number] Date of Birth: 04/28/1951  Subjective/Objective:                  Met with patient and husband in the room, provided with the HH list, They want to use [facility], I notified them that [facility] has declined taking the patient.  Husband stated that he is going to call them because they want [facility].  He said that he would let me know what they say in the morning.  There is no DME needs at this time The have been offered a bed at [facility] and he stated that would be plan B.  Will continue to monitor for needs and DC plan    Action/Plan: HH List provided to the patient per CMS.gov Expected Discharge Date:                  Expected Discharge Plan:     In-House Referral:     Discharge planning Services  CM Consult  Post Acute Care Choice:  Home Health Choice offered to:     DME Arranged:    DME Agency:     HH Arranged:  PT, Nurse's Aide HH Agency:     Status of Service:  In process, will continue to follow  If discussed at Long Length of Stay Meetings, dates discussed:    Additional Comments:   J , RN 01/18/2019, 4:21 PM  

## 2019-01-19 DIAGNOSIS — Z515 Encounter for palliative care: Secondary | ICD-10-CM

## 2019-01-19 DIAGNOSIS — Z7189 Other specified counseling: Secondary | ICD-10-CM

## 2019-01-19 DIAGNOSIS — J9600 Acute respiratory failure, unspecified whether with hypoxia or hypercapnia: Secondary | ICD-10-CM

## 2019-01-19 LAB — BASIC METABOLIC PANEL
Anion gap: 6 (ref 5–15)
BUN: 27 mg/dL — ABNORMAL HIGH (ref 8–23)
CO2: 26 mmol/L (ref 22–32)
Calcium: 7.8 mg/dL — ABNORMAL LOW (ref 8.9–10.3)
Chloride: 107 mmol/L (ref 98–111)
Creatinine, Ser: 0.63 mg/dL (ref 0.44–1.00)
GFR calc non Af Amer: >60 mL/min (ref >60)
Glucose, Bld: 92 mg/dL (ref 70–99)
Potassium: 3.6 mmol/L (ref 3.5–5.1)
Sodium: 139 mmol/L (ref 135–145)

## 2019-01-19 LAB — MAGNESIUM: Magnesium: 2.3 mg/dL (ref 1.7–2.4)

## 2019-01-19 LAB — PHOSPHORUS: PHOSPHORUS: 2.9 mg/dL (ref 2.5–4.6)

## 2019-01-19 MED ORDER — IPRATROPIUM-ALBUTEROL 0.5-2.5 (3) MG/3ML IN SOLN
3.0000 mL | Freq: Three times a day (TID) | RESPIRATORY_TRACT | Status: DC
  Administered 2019-01-19 – 2019-01-20 (×3): 3 mL via RESPIRATORY_TRACT
  Filled 2019-01-19 (×3): qty 3

## 2019-01-19 NOTE — Progress Notes (Signed)
[facility] [place] [facility]   PATIENT NAME: Cheryl [name]    MR#:  [id number]  DATE OF BIRTH:  1951-07-19  SUBJECTIVE:  CHIEF COMPLAINT:   Chief Complaint  Patient presents with  . Respiratory Distress  . Altered Mental Status   Patient admitted with acute hypoxic respiratory failure secondary to pneumonia and COPD exacerbation.  Was intubated and admitted to ICU.  Patient subsequently extubated on 01/16/2019 and transferred out of ICU .  Awake and alert and oriented this morning with no complaints.  No fevers.  Patient feeling much better.  Awaiting placement.    REVIEW OF SYSTEMS:  Review of Systems  Constitutional: Negative for chills and fever.  HENT: Negative for hearing loss and tinnitus.   Eyes: Negative for blurred vision and double vision.  Respiratory: Negative for cough and shortness of breath.   Cardiovascular: Negative for chest pain and palpitations.  Gastrointestinal: Negative for heartburn, nausea and vomiting.  Genitourinary: Negative for dysuria and urgency.  Musculoskeletal: Negative for myalgias and neck pain.  Skin: Negative for itching and rash.  Neurological: Negative for dizziness and headaches.  Psychiatric/Behavioral: Negative for depression and hallucinations.    DRUG ALLERGIES:   Allergies  Allergen Reactions  . Percocet [Oxycodone-Acetaminophen] Hives and Rash  . Aspirin Nausea And Vomiting and Other (See Comments)  . Codeine Nausea And Vomiting, Nausea Only and Other (See Comments)  . Propoxyphene Nausea Only and Other (See Comments)  . Sulfa Antibiotics Rash and Other (See Comments)   VITALS:  Blood pressure (!) 166/84, pulse 72, temperature 98.4 F (36.9 C), temperature source Axillary, resp. rate 18, height 4\' 10"  (1.473 m), weight 55.3 kg, SpO2 92 %. PHYSICAL EXAMINATION:  Physical Exam  Constitutional: She is oriented to person, place, and time. She appears well-developed and well-nourished.  HENT:  Head:  Normocephalic and atraumatic.  Right Ear: External ear normal.  Eyes: Pupils are equal, round, and reactive to light. Conjunctivae and EOM are normal. Right eye exhibits no discharge.  Neck: Normal range of motion. Neck supple. No tracheal deviation present.  Cardiovascular: Normal rate, regular rhythm and normal heart sounds.  Respiratory: Effort normal and breath sounds normal. She has no wheezes.  GI: Soft. Bowel sounds are normal.  Musculoskeletal: Normal range of motion.        General: No edema.  Neurological: She is alert and oriented to person, place, and time. She has normal reflexes. No cranial nerve deficit.  Skin: Skin is warm. No erythema.  Psychiatric: She has a normal mood and affect. Her behavior is normal.    LABORATORY PANEL:  Female CBC Recent Labs  Lab 01/18/19 0457  WBC 11.2*  HGB 9.4*  HCT 30.0*  PLT 180   ------------------------------------------------------------------------------------------------------------------ Chemistries  Recent Labs  Lab 01/15/19 1409  01/19/19 0440  NA 138   < > 139  K 4.5   < > 3.6  CL 99   < > 107  CO2 29   < > 26  GLUCOSE 106*   < > 92  BUN 25*   < > 27*  CREATININE 1.13*   < > 0.63  CALCIUM 8.8*   < > 7.8*  MG  --    < > 2.3  AST 25  --   --   ALT 15  --   --   ALKPHOS 78  --   --   BILITOT 0.8  --   --    < > = values in  this interval not displayed.   RADIOLOGY:  No results found. 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  1.  Acute hypoxic respiratory failure in the setting of pneumonia with mild COPD exacerbation Patient was intubated and initially admitted to the ICU. Patient extubated on 01/16/2019 and transferred out of ICU.  Doing well.  2.  HCAP: Continue  cefepime. Her most recent MRSA PCR was negative and therefore vancomycin was not continued.  Patient currently on cefepime.  Should have been adequately treated by tomorrow and will discontinue in a.m. Patient seen by speech therapist.  Due to concern for potential esophageal dysmotility and patient having history of hiatal hernia on prior imaging as well as patient declining GI intervention and assessment per review of previous chart, speech therapist recommended modified barium swallow which has been ordered. Aspiration precautions  3.  Acute encephalopathy in the setting of acute hypoxic respiratory failure with hypoxia: Resolved Awake and alert and oriented this morning. CT head with no acute findings.  4.  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  COPD exacerbation: Continue steroids and nebs  6.  Acute kidney injury in the setting of respiratory failure: Resolved with IV fluids Hold nephrotoxic medications  7   Essential hypertension: PRN hydralazine  DVT prophylaxis; Lovenox Patient seen by physical therapist.  Recommendation is for skilled nursing facility placement.  Case manager working on placement.  All the records are reviewed and case discussed with Care Management/Social Worker. Management plans discussed with the patient, family and they are in agreement.  CODE STATUS: Full Code  TOTAL TIME TAKING CARE OF THIS PATIENT: 28 minutes.   More than 50% of the time was spent in counseling/coordination of care: YES  POSSIBLE D/C IN 2-3 DAYS, DEPENDING ON CLINICAL CONDITION.   Eddy Termine M.D on 01/19/2019 at 2:03 PM  Between 7am to 6pm - Pager - [phone]  After 6pm go to [email or web address] - Social research officer, government  [facility]  Office  [phone]  CC: Primary care physician; Malva Limes, MD  Note: This dictation was prepared with Dragon dictation along with smaller phrase  technology. Any transcriptional errors that result from this process are unintentional.

## 2019-01-19 NOTE — Care Management (Signed)
Met with the patient and the Husband to discuss other options for [facility] since [facility] has declined accepting the patient.  IThe husband stated that they wanted to wait until PT comes back to see the patient and see how she does before picking another agency.  I spoke with them about the benefits of going to rehab for 1 - 2 weeks to build strength, The husband stated that the Doctor also recommends Rehab and that they will discuss it and let me know later

## 2019-01-19 NOTE — Progress Notes (Signed)
Per [facility] admissions coordinator at [facility] authorization has been received. Patient can D/C to [facility] when medically stable.   [employer], LCSW [phone]

## 2019-01-19 NOTE — Consult Note (Signed)
Consultation Note Date: 01/19/2019   Patient Name: Cheryl [name]  DOB: [birth year]  MRN: [id number]  Age / Sex: 68 y.o., female  PCP: Birdie Sons, MD Referring Physician: Otila Back, MD  Reason for Consultation: Establishing goals of care  HPI/Patient Profile: 68 y.o. female  with past medical history of COPD on home oxygen, systolic heart failure with EF of 45%, anxiety, arthritis, chronic pain, aspiration pneumonia multiple times, GERD, HTN, HLD, CAD  admitted on 01/15/2019 with AMS and respiratory distress. She was hospitalized 2 weeks ago for GIB and invasive work up such as endoscopy. This admission, patient found to have HCAP and COPD exacerbation. She was intubated and extubated the next day. PMT consulted for [place].  Clinical Assessment and Goals of Care: I have reviewed medical records including EPIC notes, labs and imaging, received report from Dr. Stark Jock, assessed the patient and then met with patient and her spouse, [name],  to discuss diagnosis prognosis, [place], EOL wishes, disposition and options.  I introduced Palliative Medicine as specialized medical care for people living with serious illness. It focuses on providing relief from the symptoms and stress of a serious illness. The goal is to improve quality of life for both the patient and the family.  We discussed a brief life review of the patient. She lives at home with her husband.  As far as functional and nutritional status, husband tells me patient is essentially non-ambulatory. Needs assistance with ADLs. Fair appetite.    We discussed her current illness and what it means in the larger context of her on-going co-morbidities.  Natural disease trajectory and expectations at EOL were discussed. Specifically discussed repeated hospitalizations, repeated pna, issues with swallowing, and COPD.  I attempted to elicit values and goals of care important to the patient.  Patient tells me it  is most important to her to be home.   The difference between aggressive medical intervention and comfort care was considered in light of the patient's goals of care. We discussed that the patient is currently on an aggressive medical path and repeated hospitalizations are expected. We discussed that she has the option to choose a less aggressive path and focus on comfort and quality of life. Ms. Younker indicated she would like to continue aggressive medical care.    Advance directives, concepts specific to code status, artifical feeding and hydration, and rehospitalization were considered and discussed. We discussed code status at length - Ms. Congleton elects full code. She does share she would not want long-term ventilator support, would not want a trach. Husband supports her wishes. He tells me Ms. Martus has a living will at home.   Hospice and Palliative Care services outpatient were explained and offered. Patient is currently followed by outpatient palliative care. Recommend this is continued.   Questions and concerns were addressed. The family was encouraged to call with questions or concerns.   Primary Decision Maker PATIENT  Husband is HCPOA  SUMMARY OF RECOMMENDATIONS   Continue current care - patient elects full code/full scope Continue OP palliative - patient and husband deciding between SNF or home health  Code Status/Advance Care Planning:  Full code   Symptom Management:   Per primary - denies during my visit  Palliative Prophylaxis:   Aspiration  Additional Recommendations (Limitations, Scope, Preferences):  Full Scope Treatment    Prognosis:   Unable to determine  Discharge Planning: To Be Determined      Primary Diagnoses: Present on Admission: . Acute respiratory failure ([place])  I have reviewed the medical record, interviewed the patient and family, and examined the patient. The following aspects are pertinent.  Past Medical History:  Diagnosis Date    . Acute postoperative pain 04/14/2017  . Allergy   . Anemia   . Anxiety   . Arthritis    fingers  . Aspiration pneumonitis ([place]) 11/24/2015  . Asthma   . CHF (congestive heart failure) ([place])   . Chronic pain   . COPD (chronic obstructive pulmonary disease) ([place])   . Coronary artery disease   . DVT (deep venous thrombosis) ([place])   . Dyspnea   . Dysrhythmia   . Encephalopathy    MULTIPLE TIMES  . GERD (gastroesophageal reflux disease)   . Headache   . Hyperlipidemia   . Hypertension   . Kyphoscoliosis deformity of spine   . Migraines   . Neuropathy 2010  . Osteoporosis   . Oxygen deficiency   . Peripheral vascular disease ([place])   . Pneumonia   . Pneumonia 11/19/2015  . Pneumonia 10/2016  . Pneumonia    aspiration 2018- 4 times in last year  . Rib fracture 09/19/2017   Displace right 9th rib fracture.   . Vitamin D deficiency   . Wears dentures    full upper and lower   Social History   Socioeconomic History  . Marital status: Married    Spouse name: Not on file  . Number of children: 6  . Years of education: Not on file  . Highest education level: Not on file  Occupational History  . Occupation: Disabled  Social Needs  . Financial resource strain: Not on file  . Food insecurity:    Worry: Not on file    Inability: Not on file  . Transportation needs:    Medical: Not on file    Non-medical: Not on file  Tobacco Use  . Smoking status: Current Every Day Smoker    Packs/day: 0.50    Years: 50.00    Pack years: 25.00    Types: Cigarettes  . Smokeless tobacco: Never Used  . Tobacco comment: Previously smoked 2 ppd  Substance and Sexual Activity  . Alcohol use: No    Alcohol/week: 0.0 standard drinks  . Drug use: No  . Sexual activity: Never  Lifestyle  . Physical activity:    Days per week: Not on file    Minutes per session: Not on file  . Stress: Not on file  Relationships  . Social connections:    Talks on phone: Not on file    Gets together:  Not on file    Attends religious service: Not on file    Active member of club or organization: Not on file    Attends meetings of clubs or organizations: Not on file    Relationship status: Not on file  Other Topics Concern  . Not on file  Social History Narrative  . Not on file   Family History  Problem Relation Age of Onset  . Cancer Mother   . Arthritis Mother   . Heart disease Mother   . Diabetes Mother        mellitus, type 2  . Heart disease Father   . Diabetes Sister   . Cancer Brother   . Cancer Brother        lung  . Diabetes Brother    Scheduled Meds: . amLODipine  5 mg Oral Daily  . atorvastatin  40 mg Oral q1800  . budesonide (PULMICORT)  nebulizer solution  0.25 mg Nebulization Q12H  . clopidogrel  75 mg Oral Daily  . donepezil  10 mg Oral Daily  . DULoxetine  30 mg Oral Daily  . enoxaparin (LOVENOX) injection  40 mg Subcutaneous Q24H  . fentaNYL  1 patch Transdermal Q72H  . ipratropium-albuterol  3 mL Nebulization Q6H  . memantine  5 mg Oral BID  . methylPREDNISolone (SOLU-MEDROL) injection  40 mg Intravenous Daily  . mirtazapine  15 mg Oral QHS  . pantoprazole  40 mg Oral BID  . phosphorus  500 mg Oral BID   Continuous Infusions: . ceFEPime (MAXIPIME) IV 2 g (01/19/19 0032)  . lactated ringers 50 mL/hr at 01/18/19 1511   PRN Meds:.acetaminophen **OR** acetaminophen, albuterol, hydrALAZINE, [DISCONTINUED] ondansetron **OR** ondansetron (ZOFRAN) IV, polyethylene glycol, promethazine, traMADol Allergies  Allergen Reactions  . Percocet [Oxycodone-Acetaminophen] Hives and Rash  . Aspirin Nausea And Vomiting and Other (See Comments)  . Codeine Nausea And Vomiting, Nausea Only and Other (See Comments)  . Propoxyphene Nausea Only and Other (See Comments)  . Sulfa Antibiotics Rash and Other (See Comments)   Review of Systems  Constitutional: Positive for activity change.  All other systems reviewed and are negative.   Physical Exam Constitutional:       General: She is not in acute distress. HENT:     Head: Normocephalic and atraumatic.  Cardiovascular:     Rate and Rhythm: Normal rate and regular rhythm.  Pulmonary:     Effort: Pulmonary effort is normal.  Skin:    General: Skin is warm and dry.  Neurological:     Mental Status: She is alert and oriented to person, place, and time. Mental status is at baseline.  Psychiatric:        Behavior: Behavior is withdrawn.        Cognition and Memory: Cognition and memory normal.     Vital Signs: BP (!) 166/84 (BP Location: Right Arm)   Pulse 72   Temp 98.4 F (36.9 C) (Axillary)   Resp 18   Ht _0  (1.473 m)   Wt 55.3 kg   SpO2 92%   BMI 25.48 kg/m  Pain Scale: 0-10   Pain Score: 4    SpO2: SpO2: 92 % O2 Device:SpO2: 92 % O2 Flow Rate: .O2 Flow Rate (L/min): 2 L/min  IO: Intake/output summary:   Intake/Output Summary (Last 24 hours) at 01/19/2019 1206 Last data filed at 01/19/2019 1200 Gross per 24 hour  Intake 1897.83 ml  Output 1250 ml  Net 647.83 ml    LBM: Last BM Date: 01/18/19 Baseline Weight: Weight: 54.2 kg Most recent weight: Weight: 55.3 kg      Palliative Assessment/Data: PPS 30%   Flowsheet Rows     Most Recent Value  Intake Tab  Referral Department  Critical care  Unit at Time of Referral  ICU  Palliative Care Primary Diagnosis  Sepsis/Infectious Disease  Date Notified  01/17/19  Palliative Care Type  Return patient Palliative Care  Reason for referral  Clarify Goals of Care  Date of Admission  01/17/19  # of days IP prior to Palliative referral  0  Clinical Assessment  Psychosocial & Spiritual Assessment  Palliative Care Outcomes      Time Total: 50 minutes  Greater than 50%  of this time was spent counseling and coordinating care related to the above assessment and plan.  Juel Burrow, DNP, AGNP-C Palliative Medicine Team [phone] Pager: [phone]

## 2019-01-20 ENCOUNTER — Telehealth: Payer: Self-pay | Admitting: Family Medicine

## 2019-01-20 ENCOUNTER — Other Ambulatory Visit: Payer: Self-pay

## 2019-01-20 LAB — CULTURE, BLOOD (ROUTINE X 2)
Culture: NO GROWTH
Culture: NO GROWTH
Special Requests: ADEQUATE

## 2019-01-20 MED ORDER — PREDNISONE 10 MG PO TABS: ORAL_TABLET | ORAL | 0 refills | Status: DC

## 2019-01-20 NOTE — Care Management (Signed)
Patient and her Husband has chosen [facility] [employer], notified Cory from [place] via [employer]

## 2019-01-20 NOTE — Telephone Encounter (Signed)
Is it okay for her to see another provider?  Thanks,   -Vernona Rieger

## 2019-01-20 NOTE — Telephone Encounter (Signed)
I will be out of town. Please schedule with a provider who is here.

## 2019-01-20 NOTE — Progress Notes (Signed)
Clinical Social Worker (CSW) contacted patient's husband Homero Fellers and made him aware that [facility] authorization has been received. Per Homero Fellers he has decided to take patient home with home health today. Per Homero Fellers he feels comfortable taking care of patient at home and will transport her himself. Patient will continue outpatient palliative care at home.  Hospice liaison Clydie Braun is aware of above. RN case manager aware of above. Please reconsult if future social work needs arise. CSW signing off.    [employer], LCSW [phone]

## 2019-01-20 NOTE — Discharge Summary (Signed)
[facility] NAME: Cheryl [name]    MR#:  [id number]  DATE OF BIRTH:  [birth year]  DATE OF ADMISSION:  01/15/2019   ADMITTING PHYSICIAN: Bettey Costa, MD  DATE OF DISCHARGE: 01/20/2019  PRIMARY CARE PHYSICIAN: Birdie Sons, MD   ADMISSION DIAGNOSIS:  Septic shock ([place]) [A41.9, R65.21] DISCHARGE DIAGNOSIS:  Active Problems:   Acute respiratory failure ([place])  SECONDARY DIAGNOSIS:   Past Medical History:  Diagnosis Date  . Acute postoperative pain 04/14/2017  . Allergy   . Anemia   . Anxiety   . Arthritis    fingers  . Aspiration pneumonitis ([place]) 11/24/2015  . Asthma   . CHF (congestive heart failure) ([place])   . Chronic pain   . COPD (chronic obstructive pulmonary disease) ([place])   . Coronary artery disease   . DVT (deep venous thrombosis) ([place])   . Dyspnea   . Dysrhythmia   . Encephalopathy    MULTIPLE TIMES  . GERD (gastroesophageal reflux disease)   . Headache   . Hyperlipidemia   . Hypertension   . Kyphoscoliosis deformity of spine   . Migraines   . Neuropathy 2010  . Osteoporosis   . Oxygen deficiency   . Peripheral vascular disease ([place])   . Pneumonia   . Pneumonia 11/19/2015  . Pneumonia 10/2016  . Pneumonia    aspiration 2018- 4 times in last year  . Rib fracture 09/19/2017   Displace right 9th rib fracture.   . Vitamin D deficiency   . Wears dentures    full upper and lower   HOSPITAL COURSE:  Chief complaint; unresponsiveness  History of presenting complaint Cheryl [name]  is a 68 y.o. female with a known history of essential hypertension, COPD with most recent hospitalization about 2 weeks prior to this admission for GIB who presented today due to unresponsiveness.   EMS found her with severe respiratory distress.  Patient arrived to the ER with a nonrebreather and she was responsive to pain.  She has a temperature of 100.7.  Dr. Jacqualine Code intubated the patient as per the request of the family.She  has been started on broad-spectrum antibiotics and given IV steroids.  Patient was initially admitted to the ICU.  Please refer to the H&P dictated for further details.  Hospital course;  1. Acute hypoxic respiratory failure in the setting of pneumonia with mild COPD exacerbation Patient was intubated and initially admitted to the ICU.Patient extubated on 01/16/2019 and transferred out of ICU.  Doing well.  2.HCAP: Patient was adequately treated with IV antibiotics with vancomycin initially as well as cefepime.Marland Kitchen Her most recent MRSA PCR was negative and therefore vancomycin was not continued.    Completed treatment duration with cefepime. Patient seen by speech therapist.  Due to concern for potential esophageal dysmotility and patient having history of hiatal hernia on prior imaging as well as patient declining GI intervention and assessment per review of previous chart, speech therapist recommended modified barium swallow which was done.  Patient tolerating current diet well with no difficulty swallowing.  Recommendation is for continued follow-up with speech therapist as outpatient as well as follow-up with GI as outpatient if patient agreeable if symptoms recur  3. Acute encephalopathy in the setting of acute hypoxic respiratory failure with hypoxia: Resolved Awake and alert and oriented this morning. CT head with no acute findings.  Continue supplemental oxygen which patient was using previously  4. Chronic combined systolic and diastolic heart  failure without signs of exacerbation Stable  5. COPD exacerbation: Being discharged on p.o. steroid taper.  Adequately treated with antibiotics.  Continue nebulizer treatments.  6. Acute kidney injury in the setting of respiratory failure: Resolved with IV fluids  7Essential hypertension: Blood pressure fairly controlled.  Continue home regimen.  Disposition; initial plan was for discharge to skilled nursing facility.  This morning  however patient and husband decided against going to skilled nursing facility since patient doing much better.  Husband already made arrangements to have enough help with care of patient at home.  Case manager to set up home health services on discharge  DISCHARGE CONDITIONS:  Stable CONSULTS OBTAINED:   DRUG ALLERGIES:   Allergies  Allergen Reactions  . Percocet [Oxycodone-Acetaminophen] Hives and Rash  . Aspirin Nausea And Vomiting and Other (See Comments)  . Codeine Nausea And Vomiting, Nausea Only and Other (See Comments)  . Propoxyphene Nausea Only and Other (See Comments)  . Sulfa Antibiotics Rash and Other (See Comments)   DISCHARGE MEDICATIONS:   Allergies as of 01/20/2019      Reactions   Percocet [oxycodone-acetaminophen] Hives, Rash   Aspirin Nausea And Vomiting, Other (See Comments)   Codeine Nausea And Vomiting, Nausea Only, Other (See Comments)   Propoxyphene Nausea Only, Other (See Comments)   Sulfa Antibiotics Rash, Other (See Comments)      Medication List    TAKE these medications   acetaminophen 325 MG tablet Commonly known as:  TYLENOL Take 2 tablets (650 mg total) by mouth every 6 (six) hours as needed for mild pain (or Fever >/= 101).   albuterol (2.5 MG/3ML) 0.083% nebulizer solution Commonly known as:  PROVENTIL Take 3 mLs (2.5 mg total) by nebulization every 4 (four) hours as needed for wheezing. What changed:  Another medication with the same name was changed. Make sure you understand how and when to take each.   VENTOLIN HFA 108 (90 Base) MCG/ACT inhaler Generic drug:  albuterol Inhale 2 puffs into the lungs every 4 (four) hours as needed for wheezing or shortness of breath. What changed:  See the new instructions.   alendronate 70 MG tablet Commonly known as:  FOSAMAX Take 1 tablet (70 mg total) by mouth once a week. Take with a full glass of water on an empty stomach.   ALPRAZolam 0.5 MG tablet Commonly known as:  XANAX Take 0.5-1 tablets  (0.25-0.5 mg total) by mouth at bedtime as needed for anxiety.   amLODipine 5 MG tablet Commonly known as:  NORVASC Take 1 tablet (5 mg total) by mouth daily.   atorvastatin 40 MG tablet Commonly known as:  LIPITOR Take 1 tablet (40 mg total) by mouth daily at 6 PM.   baclofen 10 MG tablet Commonly known as:  LIORESAL Take 1 tablet (10 mg total) by mouth 3 (three) times daily as needed for muscle spasms.   BELSOMRA 5 MG Tabs Generic drug:  Suvorexant Take 1 tablet by mouth at bedtime as needed (for insomnia). What changed:  See the new instructions.   benzonatate 100 MG capsule Commonly known as:  TESSALON PERLES Take 1 capsule (100 mg total) by mouth 3 (three) times daily as needed for cough.   celecoxib 100 MG capsule Commonly known as:  CELEBREX Take 100 mg by mouth 2 (two) times daily as needed.   clopidogrel 75 MG tablet Commonly known as:  PLAVIX Take 75 mg by mouth daily.   diphenoxylate-atropine 2.5-0.025 MG tablet Commonly known as:  LOMOTIL Take  1 tablet by mouth as needed for diarrhea or loose stools.   donepezil 10 MG tablet Commonly known as:  ARICEPT Take 10 mg by mouth daily.   DULoxetine 30 MG capsule Commonly known as:  CYMBALTA Take 30 mg by mouth daily.   estradiol 0.5 MG tablet Commonly known as:  ESTRACE Take 1 tablet (0.5 mg total) by mouth every other day.   fentaNYL 50 MCG/HR Commonly known as:  DURAGESIC Place 1 patch (50 mcg total) onto the skin every 3 (three) days.   ferrous sulfate 325 (65 FE) MG tablet Take 1 tablet (325 mg total) by mouth 2 (two) times daily with a meal.   Fluticasone-Salmeterol 250-50 MCG/DOSE Aepb Commonly known as:  ADVAIR DISKUS TAKE ONE PUFF TWICE DAILY.   furosemide 20 MG tablet Commonly known as:  LASIX Take 1 tablet (20 mg total) by mouth every other day.   memantine 5 MG tablet Commonly known as:  NAMENDA Take 5 mg by mouth 2 (two) times daily.   mirtazapine 15 MG disintegrating tablet Commonly  known as:  REMERON SOL-TAB Take 1 tablet (15 mg total) by mouth at bedtime.   NARCAN 4 MG/0.1ML Liqd nasal spray kit Generic drug:  naloxone Place 0.4 mg into the nose daily as needed (for accidental overdose.).   ondansetron 4 MG tablet Commonly known as:  ZOFRAN Take 4 mg by mouth as needed.   pantoprazole 40 MG tablet Commonly known as:  PROTONIX Take 40 mg by mouth 2 (two) times daily. Take 1 tablet (40 mg) by mouth scheduled every morning, may repeat dose in evening if needed for heartburn/indigestion.   potassium chloride 10 MEQ tablet Commonly known as:  K-DUR,KLOR-CON Take 10 mEq by mouth daily.   predniSONE 10 MG tablet Commonly known as:  DELTASONE Take 40 mg p.o. daily x2 days Then 30 mg p.o. daily x2 days Then 20 mg p.o. daily x2 days Then 10 mg p.o. daily x2 days   pregabalin 300 MG capsule Commonly known as:  LYRICA Take 300 mg by mouth 2 (two) times daily.   silver sulfADIAZINE 1 % cream Commonly known as:  SILVADENE Apply 1 application topically daily as needed (leg infection).   tiotropium 18 MCG inhalation capsule Commonly known as:  SPIRIVA HANDIHALER inhale the contents of one capsule in the handihaler once daily What changed:    how much to take  how to take this  when to take this   triamcinolone 0.025 % ointment Commonly known as:  KENALOG Apply 1 application topically 2 (two) times daily. What changed:    when to take this  reasons to take this   vitamin B-12 1000 MCG tablet Commonly known as:  CYANOCOBALAMIN Take 1 tablet (1,000 mcg total) by mouth daily.        DISCHARGE INSTRUCTIONS:   DIET:  Cardiac diet DISCHARGE CONDITION:  Stable ACTIVITY:  Activity as tolerated OXYGEN:  Home Oxygen: Yes.    Oxygen Delivery: Oxygen via nasal cannula at home DISCHARGE LOCATION:  home   If you experience worsening of your admission symptoms, develop shortness of breath, life threatening emergency, suicidal or homicidal thoughts  you must seek medical attention immediately by calling 911 or calling your MD immediately  if symptoms less severe.  You Must read complete instructions/literature along with all the possible adverse reactions/side effects for all the Medicines you take and that have been prescribed to you. Take any new Medicines after you have completely understood and accpet all the possible  adverse reactions/side effects.   Please note  You were cared for by a hospitalist during your hospital stay. If you have any questions about your discharge medications or the care you received while you were in the hospital after you are discharged, you can call the unit and asked to speak with the hospitalist on call if the hospitalist that took care of you is not available. Once you are discharged, your primary care physician will handle any further medical issues. Please note that NO REFILLS for any discharge medications will be authorized once you are discharged, as it is imperative that you return to your primary care physician (or establish a relationship with a primary care physician if you do not have one) for your aftercare needs so that they can reassess your need for medications and monitor your lab values.    On the day of Discharge:  VITAL SIGNS:  Blood pressure (!) 142/77, pulse 70, temperature 98.8 F (37.1 C), temperature source Oral, resp. rate 18, height '4\' 10"'$  (1.473 m), weight 55.3 kg, SpO2 98 %. PHYSICAL EXAMINATION:  GENERAL:  68 y.o.-year-old patient lying in the bed with no acute distress.  EYES: Pupils equal, round, reactive to light and accommodation. No scleral icterus. Extraocular muscles intact.  HEENT: Head atraumatic, normocephalic. Oropharynx and nasopharynx clear.  NECK:  Supple, no jugular venous distention. No thyroid enlargement, no tenderness.  LUNGS: Normal breath sounds bilaterally, no wheezing, rales,rhonchi or crepitation. No use of accessory muscles of respiration.  CARDIOVASCULAR:  S1, S2 normal. No murmurs, rubs, or gallops.  ABDOMEN: Soft, non-tender, non-distended. Bowel sounds present. No organomegaly or mass.  EXTREMITIES: No pedal edema, cyanosis, or clubbing.  NEUROLOGIC: Cranial nerves II through XII are intact. Muscle strength 5/5 in all extremities. Sensation intact. Gait not checked.  PSYCHIATRIC: The patient is alert and oriented x 3.  SKIN: No obvious rash, lesion, or ulcer.  DATA REVIEW:   CBC Recent Labs  Lab 01/18/19 0457  WBC 11.2*  HGB 9.4*  HCT 30.0*  PLT 180    Chemistries  Recent Labs  Lab 01/15/19 1409  01/19/19 0440  NA 138   < > 139  K 4.5   < > 3.6  CL 99   < > 107  CO2 29   < > 26  GLUCOSE 106*   < > 92  BUN 25*   < > 27*  CREATININE 1.13*   < > 0.63  CALCIUM 8.8*   < > 7.8*  MG  --    < > 2.3  AST 25  --   --   ALT 15  --   --   ALKPHOS 78  --   --   BILITOT 0.8  --   --    < > = values in this interval not displayed.     Microbiology Results  Results for orders placed or performed during the hospital encounter of 01/15/19  Blood Culture (routine x 2)     Status: None   Collection Time: 01/15/19  2:12 PM  Result Value Ref Range Status   Specimen Description BLOOD LEFT ANTECUBITAL  Final   Special Requests   Final    BOTTLES DRAWN AEROBIC AND ANAEROBIC Blood Culture adequate volume   Culture   Final    NO GROWTH 5 DAYS Performed at [facility], [address] [place], [place] [address]    Report Status 01/20/2019 FINAL  Final  Blood Culture (routine x 2)  Status: None   Collection Time: 01/15/19  2:13 PM  Result Value Ref Range Status   Specimen Description BLOOD RIGHT ANTECUBITAL  Final   Special Requests   Final    BOTTLES DRAWN AEROBIC AND ANAEROBIC Blood Culture results may not be optimal due to an excessive volume of blood received in culture bottles   Culture   Final    NO GROWTH 5 DAYS Performed at [facility], [address] [place], [place] [address]    Report Status  01/20/2019 FINAL  Final  Culture, respiratory (non-expectorated)     Status: None   Collection Time: 01/15/19  5:02 PM  Result Value Ref Range Status   Specimen Description   Final    TRACHEAL ASPIRATE Performed at [facility], [address], [place], [place] [address]    Special Requests   Final    NONE Performed at [facility], [address] [place], [place] [address]    Gram Stain   Final    ABUNDANT WBC PRESENT, PREDOMINANTLY PMN RARE GRAM POSITIVE COCCI RARE GRAM NEGATIVE RODS    Culture   Final    FEW KLEBSIELLA PNEUMONIAE FEW HAEMOPHILUS INFLUENZAE BETA LACTAMASE NEGATIVE Performed at [facility], [place] [address] [place], [place] [address]    Report Status 01/17/2019 FINAL  Final   Organism ID, Bacteria KLEBSIELLA PNEUMONIAE  Final      Susceptibility   Klebsiella pneumoniae - MIC*    AMPICILLIN RESISTANT Resistant     CEFAZOLIN <=4 SENSITIVE Sensitive     CEFEPIME <=1 SENSITIVE Sensitive     CEFTAZIDIME <=1 SENSITIVE Sensitive     CEFTRIAXONE <=1 SENSITIVE Sensitive     CIPROFLOXACIN <=0.25 SENSITIVE Sensitive     GENTAMICIN <=1 SENSITIVE Sensitive     IMIPENEM <=0.25 SENSITIVE Sensitive     TRIMETH/SULFA <=20 SENSITIVE Sensitive     AMPICILLIN/SULBACTAM 4 SENSITIVE Sensitive     PIP/TAZO <=4 SENSITIVE Sensitive     Extended ESBL NEGATIVE Sensitive     * FEW KLEBSIELLA PNEUMONIAE    RADIOLOGY:  No results found.   Management plans discussed with the patient, family and they are in agreement.  CODE STATUS: Full Code   TOTAL TIME TAKING CARE OF THIS PATIENT: 37 minutes.    Ark Agrusa M.D on 01/20/2019 at 11:12 AM  Between 7am to 6pm - Pager - [phone]  After 6pm go to [email or web address] - Proofreader  [facility]  Office  [phone]  CC: Primary care physician; Birdie Sons, MD   Note: This dictation was prepared with Dragon dictation along with smaller phrase technology. Any  transcriptional errors that result from this process are unintentional.

## 2019-01-20 NOTE — Care Management (Signed)
Cory with [facility] emailed me and accepted the patient for [facility], PT, Nurse and Aide

## 2019-01-20 NOTE — Telephone Encounter (Signed)
Scheduled appt with Osvaldo Angst for 01/28/2019 @8 :40 am.

## 2019-01-20 NOTE — Progress Notes (Signed)
SLP Cancellation Note  Patient Details Name: Cheryl [name] MRN: [id number] DOB: 1951/04/19   Cancelled treatment:       Reason Eval/Treat Not Completed: Patient declined, no reason specified(chart reviewed; consulted NSG/MD then husband/pt). Pt continues to not eat; family has brought in some foods to encourage. Pt was recommended to go to skilled Rehab at [facility] but pt has requested to take pt home w/ Home Health services. Recommend f/u w/ ST services at home if indicated(if any trouble swallowing noted); HOWEVER, pt does have a baseline of Esophageal issues including s/s and c/o Dysmotility - any Esophageal regurgitation of Reflux can increase risk for aspiration of the Regurgitation/Reflux. Recommend continued f/u w/ GI to further assess/manage. Recommend f/u w/ a Dietician for nutritional support.     Jerilynn Som, MS, CCC-SLP Ferron Ishmael 01/20/2019, 9:32 AM

## 2019-01-20 NOTE — Telephone Encounter (Signed)
Pt is needing a Hospital F/U for early next week.  However, Dr. Sherrie Mustache will not be here that week. Pt needing an appt.  Please call [facility] and ask for Darl Pikes at [phone].  Thanks, [employer]

## 2019-01-20 NOTE — Care Management Important Message (Signed)
Important Message  Patient Details  Name: Cheryl [name] MRN: [id number] Date of Birth: [birth year]   Medicare Important Message Given:  Yes    [name] A Shaterica Mcclatchy 01/20/2019, 11:08 AM

## 2019-01-21 ENCOUNTER — Ambulatory Visit: Payer: Medicare HMO | Admitting: Family Medicine

## 2019-01-21 ENCOUNTER — Telehealth: Payer: Self-pay

## 2019-01-21 DIAGNOSIS — R6521 Severe sepsis with septic shock: Secondary | ICD-10-CM | POA: Diagnosis not present

## 2019-01-21 DIAGNOSIS — I2510 Atherosclerotic heart disease of native coronary artery without angina pectoris: Secondary | ICD-10-CM | POA: Diagnosis not present

## 2019-01-21 DIAGNOSIS — J440 Chronic obstructive pulmonary disease with acute lower respiratory infection: Secondary | ICD-10-CM | POA: Diagnosis not present

## 2019-01-21 DIAGNOSIS — J9601 Acute respiratory failure with hypoxia: Secondary | ICD-10-CM | POA: Diagnosis not present

## 2019-01-21 DIAGNOSIS — A419 Sepsis, unspecified organism: Secondary | ICD-10-CM | POA: Diagnosis not present

## 2019-01-21 DIAGNOSIS — I5042 Chronic combined systolic (congestive) and diastolic (congestive) heart failure: Secondary | ICD-10-CM | POA: Diagnosis not present

## 2019-01-21 DIAGNOSIS — J441 Chronic obstructive pulmonary disease with (acute) exacerbation: Secondary | ICD-10-CM | POA: Diagnosis not present

## 2019-01-21 DIAGNOSIS — I110 Hypertensive heart disease with heart failure: Secondary | ICD-10-CM | POA: Diagnosis not present

## 2019-01-21 DIAGNOSIS — J150 Pneumonia due to Klebsiella pneumoniae: Secondary | ICD-10-CM | POA: Diagnosis not present

## 2019-01-21 NOTE — Telephone Encounter (Signed)
Transition Care Management Follow-up Telephone Call  Date of discharge and from where: 01/20/2019  How have you been since you were released from the hospital? "im a little better, I just ate for the first time today"  Any questions or concerns? Yes "I got some wheezing still" advised to use nebulizer and take medications as prescribed  Items Reviewed:  Did the pt receive and understand the discharge instructions provided? Yes   Medications obtained and verified? Yes   Any new allergies since your discharge? Yes   Dietary orders reviewed? Yes  Do you have support at home? Yes   Functional Questionnaire: (I = Independent and D = Dependent) ADLs:   Bathing/Dressing- D, [facility] nurse is coming to the house  Meal Prep- d  Eating- I  Maintaining continence- D,   Transferring/Ambulation- walker, someone has to help her up   Managing Meds- d, husband helps   Follow up appointments reviewed:   PCP Hospital f/u appt confirmed? Yes  Scheduled to see Hannah Beat on 01/28/2019 @ 8:40am.  Specialist Hospital f/u appt confirmed?n/a  Are transportation arrangements needed? No   If their condition worsens, is the pt aware to call PCP or go to the Emergency Dept.? Yes  Was the patient provided with contact information for the PCP's office or ED? Yes  Was to pt encouraged to call back with questions or concerns? Yes

## 2019-01-21 NOTE — Telephone Encounter (Signed)
Patient has 20 minute hospital follow up with Adriana next Thursday. Can you change it to a 40 minute appointment. She a little complicated.

## 2019-01-22 ENCOUNTER — Telehealth: Payer: Self-pay | Admitting: Family Medicine

## 2019-01-22 DIAGNOSIS — R6521 Severe sepsis with septic shock: Secondary | ICD-10-CM | POA: Diagnosis not present

## 2019-01-22 DIAGNOSIS — A419 Sepsis, unspecified organism: Secondary | ICD-10-CM | POA: Diagnosis not present

## 2019-01-22 DIAGNOSIS — J150 Pneumonia due to Klebsiella pneumoniae: Secondary | ICD-10-CM | POA: Diagnosis not present

## 2019-01-22 DIAGNOSIS — I5042 Chronic combined systolic (congestive) and diastolic (congestive) heart failure: Secondary | ICD-10-CM | POA: Diagnosis not present

## 2019-01-22 DIAGNOSIS — J441 Chronic obstructive pulmonary disease with (acute) exacerbation: Secondary | ICD-10-CM | POA: Diagnosis not present

## 2019-01-22 DIAGNOSIS — J9601 Acute respiratory failure with hypoxia: Secondary | ICD-10-CM | POA: Diagnosis not present

## 2019-01-22 DIAGNOSIS — I110 Hypertensive heart disease with heart failure: Secondary | ICD-10-CM | POA: Diagnosis not present

## 2019-01-22 DIAGNOSIS — I2510 Atherosclerotic heart disease of native coronary artery without angina pectoris: Secondary | ICD-10-CM | POA: Diagnosis not present

## 2019-01-22 DIAGNOSIS — J440 Chronic obstructive pulmonary disease with acute lower respiratory infection: Secondary | ICD-10-CM | POA: Diagnosis not present

## 2019-01-22 NOTE — Telephone Encounter (Signed)
Please review. Thanks!  

## 2019-01-22 NOTE — Telephone Encounter (Signed)
That's fine

## 2019-01-22 NOTE — Telephone Encounter (Signed)
Cheryl [name], PT w/ [facility] [phone]  Requesting orders for home PT   1 time a week for 1 week 2 times a week for 2 weeks 1 time a week for 3 weeks  Thanks, [facility]

## 2019-01-22 NOTE — Telephone Encounter (Signed)
Appt was changed to . Advised husband.

## 2019-01-23 DIAGNOSIS — M6281 Muscle weakness (generalized): Secondary | ICD-10-CM | POA: Diagnosis not present

## 2019-01-23 DIAGNOSIS — R4182 Altered mental status, unspecified: Secondary | ICD-10-CM | POA: Diagnosis not present

## 2019-01-23 DIAGNOSIS — J449 Chronic obstructive pulmonary disease, unspecified: Secondary | ICD-10-CM | POA: Diagnosis not present

## 2019-01-23 DIAGNOSIS — J189 Pneumonia, unspecified organism: Secondary | ICD-10-CM | POA: Diagnosis not present

## 2019-01-25 ENCOUNTER — Telehealth: Payer: Self-pay | Admitting: Family Medicine

## 2019-01-25 ENCOUNTER — Other Ambulatory Visit: Payer: Self-pay | Admitting: Family Medicine

## 2019-01-25 DIAGNOSIS — J9601 Acute respiratory failure with hypoxia: Secondary | ICD-10-CM | POA: Diagnosis not present

## 2019-01-25 DIAGNOSIS — J440 Chronic obstructive pulmonary disease with acute lower respiratory infection: Secondary | ICD-10-CM | POA: Diagnosis not present

## 2019-01-25 DIAGNOSIS — J150 Pneumonia due to Klebsiella pneumoniae: Secondary | ICD-10-CM | POA: Diagnosis not present

## 2019-01-25 DIAGNOSIS — R6521 Severe sepsis with septic shock: Secondary | ICD-10-CM | POA: Diagnosis not present

## 2019-01-25 DIAGNOSIS — I2510 Atherosclerotic heart disease of native coronary artery without angina pectoris: Secondary | ICD-10-CM | POA: Diagnosis not present

## 2019-01-25 DIAGNOSIS — J441 Chronic obstructive pulmonary disease with (acute) exacerbation: Secondary | ICD-10-CM | POA: Diagnosis not present

## 2019-01-25 DIAGNOSIS — A419 Sepsis, unspecified organism: Secondary | ICD-10-CM | POA: Diagnosis not present

## 2019-01-25 DIAGNOSIS — I5042 Chronic combined systolic (congestive) and diastolic (congestive) heart failure: Secondary | ICD-10-CM | POA: Diagnosis not present

## 2019-01-25 DIAGNOSIS — I110 Hypertensive heart disease with heart failure: Secondary | ICD-10-CM | POA: Diagnosis not present

## 2019-01-25 NOTE — Telephone Encounter (Signed)
That's fine

## 2019-01-25 NOTE — Telephone Encounter (Signed)
Clayton Lefort advised of verbal orders.

## 2019-01-25 NOTE — Telephone Encounter (Signed)
Lawson Fiscal, RN with [facility] [phone]  Verbal Orders for Disease and medication management   Thanks, [facility]

## 2019-01-26 NOTE — Telephone Encounter (Signed)
Left message advising Lawson Fiscal.  Thanks,   -Vernona Rieger

## 2019-01-27 DIAGNOSIS — R6521 Severe sepsis with septic shock: Secondary | ICD-10-CM | POA: Diagnosis not present

## 2019-01-27 DIAGNOSIS — J440 Chronic obstructive pulmonary disease with acute lower respiratory infection: Secondary | ICD-10-CM | POA: Diagnosis not present

## 2019-01-27 DIAGNOSIS — I110 Hypertensive heart disease with heart failure: Secondary | ICD-10-CM | POA: Diagnosis not present

## 2019-01-27 DIAGNOSIS — J150 Pneumonia due to Klebsiella pneumoniae: Secondary | ICD-10-CM | POA: Diagnosis not present

## 2019-01-27 DIAGNOSIS — A419 Sepsis, unspecified organism: Secondary | ICD-10-CM | POA: Diagnosis not present

## 2019-01-27 DIAGNOSIS — J9601 Acute respiratory failure with hypoxia: Secondary | ICD-10-CM | POA: Diagnosis not present

## 2019-01-27 DIAGNOSIS — I5042 Chronic combined systolic (congestive) and diastolic (congestive) heart failure: Secondary | ICD-10-CM | POA: Diagnosis not present

## 2019-01-27 DIAGNOSIS — J441 Chronic obstructive pulmonary disease with (acute) exacerbation: Secondary | ICD-10-CM | POA: Diagnosis not present

## 2019-01-27 DIAGNOSIS — I2510 Atherosclerotic heart disease of native coronary artery without angina pectoris: Secondary | ICD-10-CM | POA: Diagnosis not present

## 2019-01-28 ENCOUNTER — Encounter: Payer: Self-pay | Admitting: Physician Assistant

## 2019-01-28 ENCOUNTER — Ambulatory Visit (INDEPENDENT_AMBULATORY_CARE_PROVIDER_SITE_OTHER): Payer: Medicare HMO | Admitting: Physician Assistant

## 2019-01-28 ENCOUNTER — Inpatient Hospital Stay: Payer: Self-pay | Admitting: Physician Assistant

## 2019-01-28 VITALS — BP 150/74 | HR 75 | Temp 98.9°F

## 2019-01-28 DIAGNOSIS — J9620 Acute and chronic respiratory failure, unspecified whether with hypoxia or hypercapnia: Secondary | ICD-10-CM

## 2019-01-28 DIAGNOSIS — J441 Chronic obstructive pulmonary disease with (acute) exacerbation: Secondary | ICD-10-CM

## 2019-01-28 DIAGNOSIS — I5031 Acute diastolic (congestive) heart failure: Secondary | ICD-10-CM | POA: Diagnosis not present

## 2019-01-28 DIAGNOSIS — J189 Pneumonia, unspecified organism: Secondary | ICD-10-CM

## 2019-01-28 DIAGNOSIS — Z09 Encounter for follow-up examination after completed treatment for conditions other than malignant neoplasm: Secondary | ICD-10-CM

## 2019-01-28 NOTE — Progress Notes (Signed)
Patient: Cheryl [name] Female    DOB: [birth year]   68 y.o.   MRN: [id number] Visit Date: 01/28/2019  Today's Provider: Trinna Post, PA-C   Chief Complaint  Patient presents with  . Hospitalization Follow-up    Pneumonia   Subjective:     HPI   Patient is a chronically ill women with numerous health conditions the most important of which include HTN, CHF, COPD, acute on chronic respiratory failure, current tobacco abuse, history of aspiration pneumonia and dysphagia. She presents today for hospital follow up after sepsis. Patient has a history of pneumonia and is frequently admitted to the hospital.    Follow up Hospitalization  Patient was admitted to [facility] on 01/15/2019 and discharged on 01/20/2019. She was treated for Pneumonia, Septic Shock. She arrive to [facility] on a nonrebreather, responsive to pain with temperature of 100.78F. She was intubated on arrival. She was treated with IV vancomycin and cefepime. After MRSA cultures came back negative, IV vancomycin was discontinued and she was treated only with cefepime. Her CXR on 01/18/2019 shows stable left basilar atelectasis/infiltrate noted. She was didischarged on 01/20/2019.  Originally, she was intended to go to an SNF but ultimately declined this and was discharged home. She reports she has a home health nurse, physical therapy, and an aide who helps with bathing. She reports she is eating well.  Telephone follow up was done on 01/21/2019 She reports excellent compliance with treatment. She reports this condition is Improved.  Wt Readings from Last 3 Encounters:  01/16/19 121 lb 14.6 oz (55.3 kg)  01/01/19 96 lb (43.5 kg)  12/21/18 110 lb 0.2 oz (49.9 kg)   She reports she lives at home with her husband who works 12 hour shifts, 4pm - 4am. When her husband is working, her sister stays at home with her. Since her discharge, she is using 3L O2 via nasal cannula. She reports she used oxygen on an as needed basis prior to  hospitalization. She reports she needed it at night or perhaps during the day during activities. She is now using it continuously. She reports she has reduced her smoking from 1 pack daily to 5 cigarettes daily.   She reports she is compliant with her current inhalers for COPD. She denies cough. She reports she had a temperature of 99.62F two days ago. She called her home health nurse who recommended tylenol which patient did take and reports resolution of her fever. She denies productive cough.   She is followed for CHF by Dr. Neoma Laming. She is currently taking amlodipine 5 mg daily and lipitor 40 mg daily. Her most recent ECHO is available in EMR for review and on 11/03/2018 showed mildly reduced LVF, EF 45-50% with diffuse hypokinesis, grade 1 diastolic dysfunction. She was prescribed Lasix at one time but reports she does not take this because she does not want to urinate anymore than she already does. She is unable to stand enough today to be weighted. She reports her baseline weight is around 100 lbs. Her admission weight on 01/16/2019 was 121 lbs and there are no recorded weights after that. She reports she does not own a scale. She denies coughing up fluid, lower extremity edema, or abdominal distention.   Wt Readings from Last 3 Encounters:  01/16/19 121 lb 14.6 oz (55.3 kg)  01/01/19 96 lb (43.5 kg)  12/21/18 110 lb 0.2 oz (49.9 kg)   Her hospital stay was complicated by dysphagia, which has  been a source of concern from multiple medical providers due to subjective reports of choking and repeated hospitalization for pneumonia. Barium swallow study obtained during most recent hospitalization on 01/18/2019 was by report very limited in examination due to difficulty patient had standing and extreme nausea. No evidence of strictures, some intermittent distal 1/3 esophageal spasms noted.   Patient had consult with palliative care in the hospital. She is currently followed by palliative care at  home. Per the consult note, patient wishes to continue with aggressive treatment and to remain a full code.  ------------------------------------------------------------------------------------     Allergies  Allergen Reactions  . Percocet [Oxycodone-Acetaminophen] Hives and Rash  . Aspirin Nausea And Vomiting and Other (See Comments)  . Codeine Nausea And Vomiting, Nausea Only and Other (See Comments)  . Propoxyphene Nausea Only and Other (See Comments)  . Sulfa Antibiotics Rash and Other (See Comments)     Current Outpatient Medications:  .  acetaminophen (TYLENOL) 325 MG tablet, Take 2 tablets (650 mg total) by mouth every 6 (six) hours as needed for mild pain (or Fever >/= 101)., Disp: , Rfl:  .  albuterol (PROVENTIL) (2.5 MG/3ML) 0.083% nebulizer solution, Take 3 mLs (2.5 mg total) by nebulization every 4 (four) hours as needed for wheezing., Disp: 50 vial, Rfl: 12 .  alendronate (FOSAMAX) 70 MG tablet, Take 1 tablet (70 mg total) by mouth once a week. Take with a full glass of water on an empty stomach., Disp: 4 tablet, Rfl: 12 .  ALPRAZolam (XANAX) 0.5 MG tablet, Take 0.5-1 tablets (0.25-0.5 mg total) by mouth at bedtime as needed for anxiety., Disp: 30 tablet, Rfl: 5 .  amLODipine (NORVASC) 5 MG tablet, Take 1 tablet (5 mg total) by mouth daily., Disp: 30 tablet, Rfl: 12 .  atorvastatin (LIPITOR) 40 MG tablet, Take 1 tablet (40 mg total) by mouth daily at 6 PM., Disp: 30 tablet, Rfl: 1 .  baclofen (LIORESAL) 10 MG tablet, Take 1 tablet (10 mg total) by mouth 3 (three) times daily as needed for muscle spasms., Disp: 30 tablet, Rfl: 0 .  BELSOMRA 5 MG TABS, Take 1 tablet by mouth at bedtime as needed (for insomnia). (Patient taking differently: Take 5 mg by mouth at bedtime as needed (insomnia). ), Disp: 30 tablet, Rfl: 5 .  benzonatate (TESSALON PERLES) 100 MG capsule, Take 1 capsule (100 mg total) by mouth 3 (three) times daily as needed for cough., Disp: 20 capsule, Rfl: 3 .   celecoxib (CELEBREX) 100 MG capsule, Take 100 mg by mouth 2 (two) times daily as needed., Disp: , Rfl:  .  clopidogrel (PLAVIX) 75 MG tablet, Take 75 mg by mouth daily., Disp: , Rfl:  .  diphenoxylate-atropine (LOMOTIL) 2.5-0.025 MG tablet, Take 1 tablet by mouth as needed for diarrhea or loose stools., Disp: , Rfl:  .  donepezil (ARICEPT) 10 MG tablet, Take 10 mg by mouth daily., Disp: , Rfl:  .  DULoxetine (CYMBALTA) 30 MG capsule, Take 30 mg by mouth daily., Disp: , Rfl:  .  estradiol (ESTRACE) 0.5 MG tablet, Take 1 tablet (0.5 mg total) by mouth every other day., Disp: 45 tablet, Rfl: 2 .  ferrous sulfate 325 (65 FE) MG tablet, Take 1 tablet (325 mg total) by mouth 2 (two) times daily with a meal., Disp: 60 tablet, Rfl: 0 .  Fluticasone-Salmeterol (ADVAIR DISKUS) 250-50 MCG/DOSE AEPB, TAKE ONE PUFF TWICE DAILY., Disp: 60 each, Rfl: 5 .  furosemide (LASIX) 20 MG tablet, Take 1 tablet (20 mg  total) by mouth every other day., Disp: 30 tablet, Rfl: 0 .  memantine (NAMENDA) 5 MG tablet, Take 5 mg by mouth 2 (two) times daily., Disp: , Rfl:  .  mirtazapine (REMERON SOL-TAB) 15 MG disintegrating tablet, Take 1 tablet (15 mg total) by mouth at bedtime., Disp: 30 tablet, Rfl: 5 .  NARCAN 4 MG/0.1ML LIQD nasal spray kit, Place 0.4 mg into the nose daily as needed (for accidental overdose.). , Disp: , Rfl:  .  ondansetron (ZOFRAN) 4 MG tablet, Take 4 mg by mouth as needed., Disp: , Rfl:  .  pantoprazole (PROTONIX) 40 MG tablet, Take 40 mg by mouth 2 (two) times daily. Take 1 tablet (40 mg) by mouth scheduled every morning, may repeat dose in evening if needed for heartburn/indigestion., Disp: , Rfl:  .  potassium chloride (K-DUR,KLOR-CON) 10 MEQ tablet, Take 10 mEq by mouth daily., Disp: , Rfl:  .  pregabalin (LYRICA) 300 MG capsule, Take 300 mg by mouth 2 (two) times daily., Disp: , Rfl:  .  silver sulfADIAZINE (SILVADENE) 1 % cream, Apply 1 application topically daily as needed (leg infection). , Disp: ,  Rfl:  .  tiotropium (SPIRIVA HANDIHALER) 18 MCG inhalation capsule, inhale the contents of one capsule in the handihaler once daily, Disp: 30 capsule, Rfl: 12 .  triamcinolone (KENALOG) 0.025 % ointment, Apply 1 application topically 2 (two) times daily. (Patient taking differently: Apply 1 application topically 2 (two) times daily as needed (cracking skin). ), Disp: 15 g, Rfl: 0 .  VENTOLIN HFA 108 (90 Base) MCG/ACT inhaler, Inhale 2 puffs into the lungs every 4 (four) hours as needed for wheezing or shortness of breath. (Patient taking differently: Inhale 2 puffs into the lungs every 4 (four) hours as needed for wheezing or shortness of breath. ), Disp: 18 g, Rfl: 5 .  vitamin B-12 (CYANOCOBALAMIN) 1000 MCG tablet, Take 1 tablet (1,000 mcg total) by mouth daily., Disp: 30 tablet, Rfl: 0 .  fentaNYL (DURAGESIC - DOSED MCG/HR) 50 MCG/HR, Place 1 patch (50 mcg total) onto the skin every 3 (three) days., Disp: 10 patch, Rfl: 0 .  predniSONE (DELTASONE) 10 MG tablet, Take 40 mg p.o. daily x2 days Then 30 mg p.o. daily x2 days Then 20 mg p.o. daily x2 days Then 10 mg p.o. daily x2 days (Patient not taking: Reported on 01/28/2019), Disp: 30 tablet, Rfl: 0  Review of Systems  Constitutional: Positive for fatigue and fever (Temp 99.9 on 01/26/2019). Negative for activity change, appetite change, chills, diaphoresis and unexpected weight change.  HENT: Positive for voice change. Negative for congestion, ear discharge, ear pain, postnasal drip, rhinorrhea, sinus pressure, sinus pain, sneezing, sore throat and tinnitus.   Eyes: Negative.   Respiratory: Positive for cough, shortness of breath and wheezing. Negative for apnea, choking, chest tightness and stridor.   Gastrointestinal: Negative.   Neurological: Negative for dizziness, light-headedness and headaches.    Social History   Tobacco Use  . Smoking status: Current Every Day Smoker    Packs/day: 0.50    Years: 50.00    Pack years: 25.00    Types:  Cigarettes  . Smokeless tobacco: Never Used  . Tobacco comment: Previously smoked 2 ppd  Substance Use Topics  . Alcohol use: No    Alcohol/week: 0.0 standard drinks      Objective:   BP (!) 150/74 (BP Location: Right Arm, Patient Position: Sitting, Cuff Size: Normal)   Pulse 75   Temp 98.9 F (37.2 C) (Oral)  SpO2 96% Comment: With 3 Liters of O2 Vitals:   01/28/19 1207  BP: (!) 150/74  Pulse: 75  Temp: 98.9 F (37.2 C)  TempSrc: Oral  SpO2: 96%     Physical Exam Constitutional:      Comments: Frail, chronically ill woman appearing older than her stated age. Sitting in wheelchair using nasal cannula.   Cardiovascular:     Rate and Rhythm: Normal rate and regular rhythm.     Heart sounds: Normal heart sounds.  Pulmonary:     Effort: Pulmonary effort is normal.     Breath sounds: Decreased air movement present. Wheezing present.  Abdominal:     General: Abdomen is flat. There is no distension.     Palpations: Abdomen is soft.     Tenderness: There is no abdominal tenderness.  Musculoskeletal:     Right lower leg: No edema.     Left lower leg: No edema.  Skin:    General: Skin is warm and dry.  Neurological:     Mental Status: She is oriented to person, place, and time. Mental status is at baseline.  Psychiatric:        Mood and Affect: Mood normal.        Behavior: Behavior normal.         Assessment & Plan    1. Hospital discharge follow-up  I have reviewed discharge summary, labwork and imaging from most recent hospital stay. I have reconciled her medications. CXR redemonstrates atelectasis/infiltrate. Patient is a chronically ill woman with frequent hospitalizations. She has now had progression of her respiratory failure 2/2 most recent hospitalization. She is currently using 3L O2 via  and maintaining O2 sats at 96%. She continues to smoke and she has been advised that she should not smoke around her oxygen devices as oxygen is very flammable. She  expresses understanding. It appears patient at baseline has dysphagia which she has seen GI for this prior, at which point she did not want to pursue workup. Most recent barium swallow technically limited. She does also have CHF followed by Dr. Humphrey Rolls - she is noncompliant with diuretic therapy as she does not want to increase the frequency of her urination, which she already feels is too much. It is difficult to get a weight on her as patient cannot stand independently or much at all. She does not appear volume overloaded on today's exam. She has been consulted on by palliative care and prefers to keep pursuing aggressive medical treatment despite her numerous chronic diseases. I imagine her future will consist of repeated hospitalizations and stepwise decline of her already diminished functional status. She has been educated on this. She will need a follow up CXR to ensure resolution of pneumonia. I am not overly familiar with this medically complex patient and thus will have her follow up with her normal PCP in 3 weeks. Forwarding note for Dr. Maralyn Sago review.   2. COPD with acute exacerbation (HCC)  3L continuous O2 via nasal cannula. This is an increase in use from intermittent O2.   3. Pneumonia due to infectious organism, unspecified laterality, unspecified part of lung   4. Acute on chronic respiratory failure, unspecified whether with hypoxia or hypercapnia ([place])   5. Acute diastolic CHF (congestive heart failure) (HCC)  The entirety of the information documented in the History of Present Illness, Review of Systems and Physical Exam were personally obtained by me. Portions of this information were initially documented by Ashley Royalty, CMA and reviewed by  me for thoroughness and accuracy.   Return in about 3 weeks (around 02/18/2019) for chronic .       Trinna Post, PA-C  [facility]

## 2019-01-29 DIAGNOSIS — J150 Pneumonia due to Klebsiella pneumoniae: Secondary | ICD-10-CM | POA: Diagnosis not present

## 2019-01-29 DIAGNOSIS — J441 Chronic obstructive pulmonary disease with (acute) exacerbation: Secondary | ICD-10-CM | POA: Diagnosis not present

## 2019-01-29 DIAGNOSIS — I110 Hypertensive heart disease with heart failure: Secondary | ICD-10-CM | POA: Diagnosis not present

## 2019-01-29 DIAGNOSIS — I2510 Atherosclerotic heart disease of native coronary artery without angina pectoris: Secondary | ICD-10-CM | POA: Diagnosis not present

## 2019-01-29 DIAGNOSIS — A419 Sepsis, unspecified organism: Secondary | ICD-10-CM | POA: Diagnosis not present

## 2019-01-29 DIAGNOSIS — J9601 Acute respiratory failure with hypoxia: Secondary | ICD-10-CM | POA: Diagnosis not present

## 2019-01-29 DIAGNOSIS — I5042 Chronic combined systolic (congestive) and diastolic (congestive) heart failure: Secondary | ICD-10-CM | POA: Diagnosis not present

## 2019-01-29 DIAGNOSIS — R6521 Severe sepsis with septic shock: Secondary | ICD-10-CM | POA: Diagnosis not present

## 2019-01-29 DIAGNOSIS — J440 Chronic obstructive pulmonary disease with acute lower respiratory infection: Secondary | ICD-10-CM | POA: Diagnosis not present

## 2019-02-01 ENCOUNTER — Encounter: Payer: Self-pay | Admitting: Emergency Medicine

## 2019-02-01 ENCOUNTER — Emergency Department: Payer: Medicare HMO

## 2019-02-01 ENCOUNTER — Inpatient Hospital Stay
Admission: EM | Admit: 2019-02-01 | Discharge: 2019-02-03 | DRG: 871 | Disposition: A | Discharge status: Home Health | Payer: Medicare HMO | Attending: Internal Medicine | Admitting: Internal Medicine

## 2019-02-01 ENCOUNTER — Other Ambulatory Visit: Payer: Self-pay

## 2019-02-01 ENCOUNTER — Other Ambulatory Visit: Payer: Self-pay | Admitting: Family Medicine

## 2019-02-01 ENCOUNTER — Telehealth: Payer: Self-pay | Admitting: Family Medicine

## 2019-02-01 DIAGNOSIS — R6520 Severe sepsis without septic shock: Secondary | ICD-10-CM | POA: Diagnosis present

## 2019-02-01 DIAGNOSIS — Z9049 Acquired absence of other specified parts of digestive tract: Secondary | ICD-10-CM

## 2019-02-01 DIAGNOSIS — Z90710 Acquired absence of both cervix and uterus: Secondary | ICD-10-CM

## 2019-02-01 DIAGNOSIS — F17210 Nicotine dependence, cigarettes, uncomplicated: Secondary | ICD-10-CM | POA: Diagnosis present

## 2019-02-01 DIAGNOSIS — Z882 Allergy status to sulfonamides status: Secondary | ICD-10-CM | POA: Diagnosis not present

## 2019-02-01 DIAGNOSIS — Z7951 Long term (current) use of inhaled steroids: Secondary | ICD-10-CM

## 2019-02-01 DIAGNOSIS — J441 Chronic obstructive pulmonary disease with (acute) exacerbation: Secondary | ICD-10-CM | POA: Diagnosis present

## 2019-02-01 DIAGNOSIS — Z7902 Long term (current) use of antithrombotics/antiplatelets: Secondary | ICD-10-CM | POA: Diagnosis not present

## 2019-02-01 DIAGNOSIS — Z981 Arthrodesis status: Secondary | ICD-10-CM

## 2019-02-01 DIAGNOSIS — F0390 Unspecified dementia without behavioral disturbance: Secondary | ICD-10-CM | POA: Diagnosis present

## 2019-02-01 DIAGNOSIS — I5031 Acute diastolic (congestive) heart failure: Secondary | ICD-10-CM | POA: Diagnosis present

## 2019-02-01 DIAGNOSIS — I110 Hypertensive heart disease with heart failure: Secondary | ICD-10-CM | POA: Diagnosis present

## 2019-02-01 DIAGNOSIS — G8929 Other chronic pain: Secondary | ICD-10-CM | POA: Diagnosis present

## 2019-02-01 DIAGNOSIS — E785 Hyperlipidemia, unspecified: Secondary | ICD-10-CM | POA: Diagnosis present

## 2019-02-01 DIAGNOSIS — J440 Chronic obstructive pulmonary disease with acute lower respiratory infection: Secondary | ICD-10-CM | POA: Diagnosis present

## 2019-02-01 DIAGNOSIS — J150 Pneumonia due to Klebsiella pneumoniae: Secondary | ICD-10-CM | POA: Diagnosis not present

## 2019-02-01 DIAGNOSIS — Z888 Allergy status to other drugs, medicaments and biological substances status: Secondary | ICD-10-CM

## 2019-02-01 DIAGNOSIS — Z79899 Other long term (current) drug therapy: Secondary | ICD-10-CM

## 2019-02-01 DIAGNOSIS — J9621 Acute and chronic respiratory failure with hypoxia: Secondary | ICD-10-CM | POA: Diagnosis present

## 2019-02-01 DIAGNOSIS — M810 Age-related osteoporosis without current pathological fracture: Secondary | ICD-10-CM | POA: Diagnosis present

## 2019-02-01 DIAGNOSIS — R531 Weakness: Secondary | ICD-10-CM | POA: Diagnosis not present

## 2019-02-01 DIAGNOSIS — J189 Pneumonia, unspecified organism: Secondary | ICD-10-CM | POA: Diagnosis not present

## 2019-02-01 DIAGNOSIS — E860 Dehydration: Secondary | ICD-10-CM | POA: Diagnosis present

## 2019-02-01 DIAGNOSIS — N179 Acute kidney failure, unspecified: Secondary | ICD-10-CM | POA: Diagnosis present

## 2019-02-01 DIAGNOSIS — R0902 Hypoxemia: Secondary | ICD-10-CM | POA: Diagnosis not present

## 2019-02-01 DIAGNOSIS — J168 Pneumonia due to other specified infectious organisms: Secondary | ICD-10-CM | POA: Diagnosis not present

## 2019-02-01 DIAGNOSIS — A419 Sepsis, unspecified organism: Secondary | ICD-10-CM | POA: Diagnosis not present

## 2019-02-01 DIAGNOSIS — G629 Polyneuropathy, unspecified: Secondary | ICD-10-CM | POA: Diagnosis present

## 2019-02-01 DIAGNOSIS — I959 Hypotension, unspecified: Secondary | ICD-10-CM | POA: Diagnosis not present

## 2019-02-01 DIAGNOSIS — I2510 Atherosclerotic heart disease of native coronary artery without angina pectoris: Secondary | ICD-10-CM | POA: Diagnosis not present

## 2019-02-01 DIAGNOSIS — Z8261 Family history of arthritis: Secondary | ICD-10-CM

## 2019-02-01 DIAGNOSIS — K219 Gastro-esophageal reflux disease without esophagitis: Secondary | ICD-10-CM | POA: Diagnosis present

## 2019-02-01 DIAGNOSIS — I739 Peripheral vascular disease, unspecified: Secondary | ICD-10-CM | POA: Diagnosis present

## 2019-02-01 DIAGNOSIS — Z886 Allergy status to analgesic agent status: Secondary | ICD-10-CM

## 2019-02-01 DIAGNOSIS — Z720 Tobacco use: Secondary | ICD-10-CM | POA: Diagnosis not present

## 2019-02-01 DIAGNOSIS — I5042 Chronic combined systolic (congestive) and diastolic (congestive) heart failure: Secondary | ICD-10-CM | POA: Diagnosis not present

## 2019-02-01 DIAGNOSIS — Z8701 Personal history of pneumonia (recurrent): Secondary | ICD-10-CM

## 2019-02-01 DIAGNOSIS — R05 Cough: Secondary | ICD-10-CM | POA: Diagnosis not present

## 2019-02-01 DIAGNOSIS — Z833 Family history of diabetes mellitus: Secondary | ICD-10-CM

## 2019-02-01 DIAGNOSIS — Z8249 Family history of ischemic heart disease and other diseases of the circulatory system: Secondary | ICD-10-CM

## 2019-02-01 DIAGNOSIS — Z885 Allergy status to narcotic agent status: Secondary | ICD-10-CM | POA: Diagnosis not present

## 2019-02-01 DIAGNOSIS — Z9981 Dependence on supplemental oxygen: Secondary | ICD-10-CM | POA: Diagnosis not present

## 2019-02-01 DIAGNOSIS — R509 Fever, unspecified: Secondary | ICD-10-CM | POA: Diagnosis not present

## 2019-02-01 DIAGNOSIS — J9601 Acute respiratory failure with hypoxia: Secondary | ICD-10-CM | POA: Diagnosis not present

## 2019-02-01 DIAGNOSIS — R6521 Severe sepsis with septic shock: Secondary | ICD-10-CM | POA: Diagnosis not present

## 2019-02-01 LAB — CREATININE, SERUM
Creatinine, Ser: 1.48 mg/dL — ABNORMAL HIGH (ref 0.44–1.00)
GFR calc Af Amer: 41 mL/min — ABNORMAL LOW (ref >60)
GFR calc non Af Amer: 36 mL/min — ABNORMAL LOW (ref >60)

## 2019-02-01 LAB — PROTIME-INR
INR: 1 (ref 0.8–1.2)
INR: 1.1 (ref 0.8–1.2)
PROTHROMBIN TIME: 13.7 s (ref 11.4–15.2)
Prothrombin Time: 12.7 seconds (ref 11.4–15.2)

## 2019-02-01 LAB — COMPREHENSIVE METABOLIC PANEL
ALT: 18 U/L (ref 0–44)
AST: 20 U/L (ref 15–41)
Albumin: 3.6 g/dL (ref 3.5–5.0)
Alkaline Phosphatase: 59 U/L (ref 38–126)
Anion gap: 11 (ref 5–15)
BUN: 29 mg/dL — ABNORMAL HIGH (ref 8–23)
CO2: 30 mmol/L (ref 22–32)
Calcium: 8 mg/dL — ABNORMAL LOW (ref 8.9–10.3)
Chloride: 96 mmol/L — ABNORMAL LOW (ref 98–111)
Creatinine, Ser: 1.69 mg/dL — ABNORMAL HIGH (ref 0.44–1.00)
GFR calc Af Amer: 35 mL/min — ABNORMAL LOW (ref >60)
GFR calc non Af Amer: 30 mL/min — ABNORMAL LOW (ref >60)
Glucose, Bld: 92 mg/dL (ref 70–99)
Potassium: 5.1 mmol/L (ref 3.5–5.1)
Sodium: 137 mmol/L (ref 135–145)
TOTAL PROTEIN: 6.5 g/dL (ref 6.5–8.1)
Total Bilirubin: 0.9 mg/dL (ref 0.3–1.2)

## 2019-02-01 LAB — CBC WITH DIFFERENTIAL/PLATELET
Abs Immature Granulocytes: 0.08 10*3/uL — ABNORMAL HIGH (ref 0.00–0.07)
Basophils Absolute: 0.1 10*3/uL (ref 0.0–0.1)
Basophils Relative: 0 %
Eosinophils Absolute: 0.1 10*3/uL (ref 0.0–0.5)
Eosinophils Relative: 0 %
HCT: 35.6 % — ABNORMAL LOW (ref 36.0–46.0)
Hemoglobin: 11.1 g/dL — ABNORMAL LOW (ref 12.0–15.0)
Immature Granulocytes: 1 %
Lymphocytes Relative: 6 %
Lymphs Abs: 1.1 10*3/uL (ref 0.7–4.0)
MCH: 31.2 pg (ref 26.0–34.0)
MCHC: 31.2 g/dL (ref 30.0–36.0)
MCV: 100 fL (ref 80.0–100.0)
Monocytes Absolute: 0.9 10*3/uL (ref 0.1–1.0)
Monocytes Relative: 5 %
NEUTROS ABS: 15.3 10*3/uL — AB (ref 1.7–7.7)
NEUTROS PCT: 88 %
Platelets: 219 10*3/uL (ref 150–400)
RBC: 3.56 MIL/uL — ABNORMAL LOW (ref 3.87–5.11)
RDW: 15.7 % — ABNORMAL HIGH (ref 11.5–15.5)
WBC: 17.4 10*3/uL — ABNORMAL HIGH (ref 4.0–10.5)
nRBC: 0 % (ref 0.0–0.2)

## 2019-02-01 LAB — INFLUENZA PANEL BY PCR (TYPE A & B)
Influenza A By PCR: NEGATIVE
Influenza B By PCR: NEGATIVE

## 2019-02-01 LAB — APTT: aPTT: 34 seconds (ref 24–36)

## 2019-02-01 LAB — LACTIC ACID, PLASMA: Lactic Acid, Venous: 1.3 mmol/L (ref 0.5–1.9)

## 2019-02-01 LAB — PROCALCITONIN: Procalcitonin: 6.59 ng/mL

## 2019-02-01 MED ORDER — SODIUM CHLORIDE 0.9 % IV BOLUS
500.0000 mL | Freq: Once | INTRAVENOUS | Status: AC
  Administered 2019-02-01: 500 mL via INTRAVENOUS

## 2019-02-01 MED ORDER — PREGABALIN 75 MG PO CAPS
300.0000 mg | ORAL_CAPSULE | Freq: Two times a day (BID) | ORAL | Status: DC
  Administered 2019-02-02 (×2): 300 mg via ORAL
  Filled 2019-02-01 (×3): qty 4

## 2019-02-01 MED ORDER — FENTANYL 50 MCG/HR TD PT72
1.0000 | MEDICATED_PATCH | TRANSDERMAL | Status: DC
  Administered 2019-02-01: 22:00:00 1 via TRANSDERMAL
  Filled 2019-02-01: qty 1

## 2019-02-01 MED ORDER — HEPARIN SODIUM (PORCINE) 5000 UNIT/ML IJ SOLN
5000.0000 [IU] | Freq: Three times a day (TID) | INTRAMUSCULAR | Status: DC
  Administered 2019-02-01 – 2019-02-03 (×5): 5000 [IU] via SUBCUTANEOUS
  Filled 2019-02-01 (×5): qty 1

## 2019-02-01 MED ORDER — VITAMIN B-12 1000 MCG PO TABS
1000.0000 ug | ORAL_TABLET | Freq: Every day | ORAL | Status: DC
  Administered 2019-02-02: 10:00:00 1000 ug via ORAL
  Filled 2019-02-01: qty 1

## 2019-02-01 MED ORDER — GUAIFENESIN-DM 100-10 MG/5ML PO SYRP: 5.0000 mL | ORAL_SOLUTION | ORAL | Status: DC | PRN

## 2019-02-01 MED ORDER — SUVOREXANT 5 MG PO TABS: 5.0000 mg | ORAL_TABLET | Freq: Every evening | ORAL | Status: DC | PRN

## 2019-02-01 MED ORDER — BENZONATATE 100 MG PO CAPS
100.0000 mg | ORAL_CAPSULE | Freq: Three times a day (TID) | ORAL | Status: DC | PRN
  Administered 2019-02-02: 100 mg via ORAL
  Filled 2019-02-01: qty 1

## 2019-02-01 MED ORDER — CLOPIDOGREL BISULFATE 75 MG PO TABS
75.0000 mg | ORAL_TABLET | Freq: Every day | ORAL | Status: DC
  Administered 2019-02-02: 75 mg via ORAL
  Filled 2019-02-01: qty 1

## 2019-02-01 MED ORDER — MIRTAZAPINE 15 MG PO TBDP
15.0000 mg | ORAL_TABLET | Freq: Every day | ORAL | Status: DC
  Administered 2019-02-02: 22:00:00 15 mg via ORAL
  Filled 2019-02-01 (×3): qty 1

## 2019-02-01 MED ORDER — SENNOSIDES-DOCUSATE SODIUM 8.6-50 MG PO TABS: 1.0000 | ORAL_TABLET | Freq: Every evening | ORAL | Status: DC | PRN

## 2019-02-01 MED ORDER — ONDANSETRON HCL 4 MG PO TABS: 4.0000 mg | ORAL_TABLET | Freq: Four times a day (QID) | ORAL | Status: DC | PRN

## 2019-02-01 MED ORDER — MOMETASONE FURO-FORMOTEROL FUM 200-5 MCG/ACT IN AERO
2.0000 | INHALATION_SPRAY | Freq: Two times a day (BID) | RESPIRATORY_TRACT | Status: DC
  Administered 2019-02-01: 22:00:00 2 via RESPIRATORY_TRACT
  Filled 2019-02-01: qty 8.8

## 2019-02-01 MED ORDER — SODIUM CHLORIDE 0.9 % IV SOLN
2.0000 g | Freq: Once | INTRAVENOUS | Status: AC
  Administered 2019-02-01: 2 g via INTRAVENOUS
  Filled 2019-02-01: qty 2

## 2019-02-01 MED ORDER — ALPRAZOLAM 0.25 MG PO TABS
0.2500 mg | ORAL_TABLET | Freq: Every evening | ORAL | Status: DC | PRN
  Administered 2019-02-02: 22:00:00 0.25 mg via ORAL
  Filled 2019-02-01: qty 2

## 2019-02-01 MED ORDER — SODIUM CHLORIDE 0.9% FLUSH
3.0000 mL | Freq: Once | INTRAVENOUS | Status: AC
  Administered 2019-02-01: 3 mL via INTRAVENOUS

## 2019-02-01 MED ORDER — FERROUS SULFATE 325 (65 FE) MG PO TABS
325.0000 mg | ORAL_TABLET | Freq: Two times a day (BID) | ORAL | Status: DC
  Administered 2019-02-02 (×2): 325 mg via ORAL
  Filled 2019-02-01 (×2): qty 1

## 2019-02-01 MED ORDER — POTASSIUM CHLORIDE CRYS ER 10 MEQ PO TBCR
10.0000 meq | EXTENDED_RELEASE_TABLET | Freq: Every day | ORAL | Status: DC
  Administered 2019-02-02: 10:00:00 10 meq via ORAL
  Filled 2019-02-01: qty 1

## 2019-02-01 MED ORDER — SODIUM CHLORIDE 0.9 % IV BOLUS
500.0000 mL | Freq: Once | INTRAVENOUS | Status: AC
  Administered 2019-02-01: 20:00:00 500 mL via INTRAVENOUS

## 2019-02-01 MED ORDER — ONDANSETRON HCL 4 MG/2ML IJ SOLN: 4.0000 mg | Freq: Four times a day (QID) | INTRAMUSCULAR | Status: DC | PRN

## 2019-02-01 MED ORDER — SODIUM CHLORIDE 0.9 % IV BOLUS (SEPSIS)
500.0000 mL | Freq: Once | INTRAVENOUS | Status: AC
  Administered 2019-02-01: 500 mL via INTRAVENOUS

## 2019-02-01 MED ORDER — ATORVASTATIN CALCIUM 20 MG PO TABS
40.0000 mg | ORAL_TABLET | Freq: Every day | ORAL | Status: DC
  Administered 2019-02-02: 40 mg via ORAL
  Filled 2019-02-01: qty 2

## 2019-02-01 MED ORDER — ALBUTEROL SULFATE (2.5 MG/3ML) 0.083% IN NEBU: 2.5000 mg | INHALATION_SOLUTION | RESPIRATORY_TRACT | Status: DC | PRN

## 2019-02-01 MED ORDER — PANTOPRAZOLE SODIUM 40 MG PO TBEC
40.0000 mg | DELAYED_RELEASE_TABLET | Freq: Two times a day (BID) | ORAL | Status: DC
  Administered 2019-02-02 (×2): 40 mg via ORAL
  Filled 2019-02-01 (×3): qty 1

## 2019-02-01 MED ORDER — ESTRADIOL 1 MG PO TABS
0.5000 mg | ORAL_TABLET | ORAL | Status: DC
  Administered 2019-02-02: 0.5 mg via ORAL
  Filled 2019-02-01: qty 0.5

## 2019-02-01 MED ORDER — DIPHENOXYLATE-ATROPINE 2.5-0.025 MG PO TABS: 1.0000 | ORAL_TABLET | ORAL | Status: DC | PRN

## 2019-02-01 MED ORDER — TIOTROPIUM BROMIDE MONOHYDRATE 18 MCG IN CAPS
18.0000 ug | ORAL_CAPSULE | Freq: Every morning | RESPIRATORY_TRACT | Status: DC
  Filled 2019-02-01: qty 5

## 2019-02-01 MED ORDER — DULOXETINE HCL 30 MG PO CPEP
30.0000 mg | ORAL_CAPSULE | Freq: Every day | ORAL | Status: DC
  Administered 2019-02-02: 10:00:00 30 mg via ORAL
  Filled 2019-02-01: qty 1

## 2019-02-01 MED ORDER — SODIUM CHLORIDE 0.9 % IV BOLUS (SEPSIS)
1000.0000 mL | Freq: Once | INTRAVENOUS | Status: AC
  Administered 2019-02-01: 1000 mL via INTRAVENOUS

## 2019-02-01 MED ORDER — BISACODYL 5 MG PO TBEC: 5.0000 mg | DELAYED_RELEASE_TABLET | Freq: Every day | ORAL | Status: DC | PRN

## 2019-02-01 MED ORDER — MEMANTINE HCL 5 MG PO TABS
5.0000 mg | ORAL_TABLET | Freq: Two times a day (BID) | ORAL | Status: DC
  Administered 2019-02-02 (×2): 5 mg via ORAL
  Filled 2019-02-01 (×3): qty 1

## 2019-02-01 MED ORDER — SODIUM CHLORIDE 0.9 % IV SOLN
1.0000 g | INTRAVENOUS | Status: DC
  Administered 2019-02-02: 1 g via INTRAVENOUS
  Filled 2019-02-01 (×2): qty 1

## 2019-02-01 MED ORDER — VANCOMYCIN HCL 500 MG IV SOLR: 500.0000 mg | INTRAVENOUS | Status: DC

## 2019-02-01 MED ORDER — VANCOMYCIN HCL IN DEXTROSE 1-5 GM/200ML-% IV SOLN
1000.0000 mg | Freq: Once | INTRAVENOUS | Status: AC
  Administered 2019-02-01: 1000 mg via INTRAVENOUS
  Filled 2019-02-01: qty 200

## 2019-02-01 NOTE — Progress Notes (Signed)
Pharmacy Antibiotic Note  Cheryl [name] is a 68 y.o. female admitted on 02/01/2019 with pneumonia.  Pharmacy has been consulted for vancomycin and cefepime dosing.  Plan: Vancomycin 500 mg IV Q 36 hrs. Goal AUC 400-550. Expected AUC: 501 SCr used: 1.69  Cefepime 1gm q24h   Height: 4\' 10"  (147.3 cm) Weight: 96 lb (43.5 kg) IBW/kg (Calculated) : 40.9  Temp (24hrs), Avg:100.8 F (38.2 C), Min:100 F (37.8 C), Max:101.6 F (38.7 C)  Recent Labs  Lab 02/01/19 1422  WBC 17.4*  CREATININE 1.69*  LATICACIDVEN 1.3    Estimated Creatinine Clearance: 20.3 mL/min (A) (by C-G formula based on SCr of 1.69 mg/dL (H)).    Allergies  Allergen Reactions  . Percocet [Oxycodone-Acetaminophen] Hives and Rash  . Aspirin Nausea And Vomiting and Other (See Comments)  . Codeine Nausea And Vomiting, Nausea Only and Other (See Comments)  . Propoxyphene Nausea Only and Other (See Comments)  . Sulfa Antibiotics Rash and Other (See Comments)    Antimicrobials this admission: 3/9 vancomycin >>  3/9 cefepime >>   Microbiology results: 3/9 BCx: pending 3/9 MRSA PCR: pending  Thank you for allowing pharmacy to be a part of this patient's care.  Mattie Novosel 02/01/2019 4:49 PM

## 2019-02-01 NOTE — Plan of Care (Signed)
Pt admitted from the ED.  VSS. Pt on 4L O2 with O2 sats 90%. Pt uses 3L O2 at home.  Pt reported mild chronic back pain, declined an intervention.

## 2019-02-01 NOTE — Progress Notes (Signed)
Advanced Care Plan.  Purpose of Encounter: CODE STATUS. Parties in Attendance: The patient, her brother and sister, me. Patient's Decisional Capacity: Yes. Medical Story: Cheryl [name]  is a 68 y.o. female with a known history of multiple medical problems including aspiration pneumonitis, asthma, COPD, chronic respiratory failure on home oxygen, CHF, chronic pain, CAD, DVT, hypertension, hyperlipidemia, neuropathy and etc. the patient is being admitted for acute on chronic respiratory failure with hypoxia and hypotension due to sepsis and pneumonia.  I discussed with patient about her current condition, prognosis and CODE STATUS.  The patient wants to be resuscitated and intubated if she has cardiopulmonary arrest. Plan:  Code Status: Full code. Time spent discussing advance care planning:  18 minutes.

## 2019-02-01 NOTE — ED Notes (Signed)
ED TO INPATIENT HANDOFF REPORT  ED Nurse Name and Phone #: Maggie Schwalbe [phone]  S Name/Age/Gender Cheryl [name] 68 y.o. female Room/Bed: ED04A/ED04A  Code Status   Code Status: Prior  Home/SNF/Other Home Patient oriented to: self, place, time and situation Is this baseline? Yes   Triage Complete: Triage complete  Chief Complaint possible sepsis  Triage Note ARrives from home.  [facility] called [facility] fever.  Patient lives alone and home health aid called EMS.  EMS reports patients patient meets Sepsis criterea. Recent hosptialization for pneumonia.  @20  g SL left hand.  ETCO2:  23  T:  101.9  R:  20.  Patient is oxygen dependent at home, wears 2l/ [place].  Currently on 4l? [place].  Initial sats 88-92% on 3/L.  On 4L/ [place] sats 94-99%.   Allergies Allergies  Allergen Reactions  . Percocet [Oxycodone-Acetaminophen] Hives and Rash  . Aspirin Nausea And Vomiting and Other (See Comments)  . Codeine Nausea And Vomiting, Nausea Only and Other (See Comments)  . Propoxyphene Nausea Only and Other (See Comments)  . Sulfa Antibiotics Rash and Other (See Comments)    Level of Care/Admitting Diagnosis ED Disposition    ED Disposition Condition Comment   Admit  Hospital Area: [facility] [[id number]]  Level of Care: Med-Surg [16]  Diagnosis: Sepsis [facility]) [[id number]]  Admitting Physician: Shaune Pollack [[id number]]  Attending Physician: Shaune Pollack [[id number]]  Estimated length of stay: past midnight tomorrow  Certification:: I certify this patient will need inpatient services for at least 2 midnights  PT Class (Do Not Modify): Inpatient [101]  PT Acc Code (Do Not Modify): Private [1]       B Medical/Surgery History Past Medical History:  Diagnosis Date  . Acute postoperative pain 04/14/2017  . Allergy   . Anemia   . Anxiety   . Arthritis    fingers  . Aspiration pneumonitis (HCC) 11/24/2015  . Asthma   . CHF (congestive heart failure) (HCC)   . Chronic pain   . COPD (chronic obstructive  pulmonary disease) (HCC)   . Coronary artery disease   . DVT (deep venous thrombosis) (HCC)   . Dyspnea   . Dysrhythmia   . Encephalopathy    MULTIPLE TIMES  . GERD (gastroesophageal reflux disease)   . Headache   . Hyperlipidemia   . Hypertension   . Kyphoscoliosis deformity of spine   . Migraines   . Neuropathy 2010  . Osteoporosis   . Oxygen deficiency   . Peripheral vascular disease (HCC)   . Pneumonia   . Pneumonia 11/19/2015  . Pneumonia 10/2016  . Pneumonia    aspiration 2018- 4 times in last year  . Rib fracture 09/19/2017   Displace right 9th rib fracture.   . Vitamin D deficiency   . Wears dentures    full upper and lower   Past Surgical History:  Procedure Laterality Date  . ABDOMINAL HYSTERECTOMY  1975   Bilaterl Oophorectomy; [place] to IUD infection  . abdomnal aortic stent  05/30/2008   Dr. Nanetta Batty  . APPENDECTOMY    . APPENDECTOMY    . BACK SURGERY    . cardiac catherization  10/31/2009  . CARDIAC CATHETERIZATION    . CATARACT EXTRACTION W/PHACO Left 08/20/2018   Procedure: CATARACT EXTRACTION PHACO AND INTRAOCULAR LENS PLACEMENT (IOC);  Surgeon: Nevada Crane, MD;  Location: [facility] ORS;  Service: Ophthalmology;  Laterality: Left;  [place] 00:45.1 AP% 12.3 CDE 5.54 FLUID PACK LOT # [id number]  .  CATARACT EXTRACTION W/PHACO Right 09/23/2018   Procedure: CATARACT EXTRACTION PHACO AND INTRAOCULAR LENS PLACEMENT (IOC);  Surgeon: Nevada Crane, MD;  Location: [facility] ORS;  Service: Ophthalmology;  Laterality: Right;  [place] 00:45.0 CDE 5.19 Fluid Pack lot # [id number]  . CERVICAL FUSION  C5 - 6/C6-7  . CHOLECYSTECTOMY  1972  . COLONOSCOPY WITH PROPOFOL N/A 07/27/2015   Procedure: COLONOSCOPY WITH PROPOFOL;  Surgeon: Wallace Cullens, MD;  Location: [facility] ENDOSCOPY;  Service: Gastroenterology;  Laterality: N/A;  . ESOPHAGOGASTRODUODENOSCOPY (EGD) WITH PROPOFOL N/A 07/27/2015   Procedure: ESOPHAGOGASTRODUODENOSCOPY (EGD) WITH PROPOFOL;  Surgeon: Wallace Cullens, MD;  Location:  [facility] ENDOSCOPY;  Service: Gastroenterology;  Laterality: N/A;  . ESOPHAGOGASTRODUODENOSCOPY (EGD) WITH PROPOFOL N/A 05/19/2018   Procedure: ESOPHAGOGASTRODUODENOSCOPY (EGD) WITH PROPOFOL;  Surgeon: Midge Minium, MD;  Location: [facility] ENDOSCOPY;  Service: Endoscopy;  Laterality: N/A;  . EYE SURGERY    . FOOT SURGERY Bilateral    5-6 years per patient  . SPINE SURGERY    . TONSILLECTOMY    . VASCULAR SURGERY     LEG STENTS     A IV Location/Drains/Wounds Patient Lines/Drains/Airways Status   Active Line/Drains/Airways    Name:   Placement date:   Placement time:   Site:   Days:   Peripheral IV 02/01/19 Left Hand   02/01/19    1421    Hand   less than 1   Peripheral IV 02/01/19 Right Antecubital   02/01/19    1551    Antecubital   less than 1          Intake/Output Last 24 hours No intake or output data in the 24 hours ending 02/01/19 1645  Labs/Imaging Results for orders placed or performed during the hospital encounter of 02/01/19 (from the past 48 hour(s))  Comprehensive metabolic panel     Status: Abnormal   Collection Time: 02/01/19  2:22 PM  Result Value Ref Range   Sodium 137 135 - 145 mmol/L   Potassium 5.1 3.5 - 5.1 mmol/L   Chloride 96 (L) 98 - 111 mmol/L   CO2 30 22 - 32 mmol/L   Glucose, Bld 92 70 - 99 mg/dL   BUN 29 (H) 8 - 23 mg/dL   Creatinine, Ser 4.54 (H) 0.44 - 1.00 mg/dL   Calcium 8.0 (L) 8.9 - 10.3 mg/dL   Total Protein 6.5 6.5 - 8.1 g/dL   Albumin 3.6 3.5 - 5.0 g/dL   AST 20 15 - 41 U/L   ALT 18 0 - 44 U/L   Alkaline Phosphatase 59 38 - 126 U/L   Total Bilirubin 0.9 0.3 - 1.2 mg/dL   GFR calc non Af Amer 30 (L) >60 mL/min   GFR calc Af Amer 35 (L) >60 mL/min   Anion gap 11 5 - 15    Comment: Performed at [facility], [address] [place], [place] [address]  Lactic acid, plasma     Status: None   Collection Time: 02/01/19  2:22 PM  Result Value Ref Range   Lactic Acid, Venous 1.3 0.5 - 1.9 mmol/L    Comment: Performed at [facility], [address] [place], [place] [address]  CBC with Differential     Status: Abnormal   Collection Time: 02/01/19  2:22 PM  Result Value Ref Range   WBC 17.4 (H) 4.0 - 10.5 K/uL   RBC 3.56 (L) 3.87 - 5.11 MIL/uL   Hemoglobin 11.1 (L) 12.0 - 15.0 g/dL   HCT 29.5 (  L) 36.0 - 46.0 %   MCV 100.0 80.0 - 100.0 fL   MCH 31.2 26.0 - 34.0 pg   MCHC 31.2 30.0 - 36.0 g/dL   RDW 54.015.7 (H) 98.111.5 - 19.115.5 %   Platelets 219 150 - 400 K/uL   nRBC 0.0 0.0 - 0.2 %   Neutrophils Relative % 88 %   Neutro Abs 15.3 (H) 1.7 - 7.7 K/uL   Lymphocytes Relative 6 %   Lymphs Abs 1.1 0.7 - 4.0 K/uL   Monocytes Relative 5 %   Monocytes Absolute 0.9 0.1 - 1.0 K/uL   Eosinophils Relative 0 %   Eosinophils Absolute 0.1 0.0 - 0.5 K/uL   Basophils Relative 0 %   Basophils Absolute 0.1 0.0 - 0.1 K/uL   Immature Granulocytes 1 %   Abs Immature Granulocytes 0.08 (H) 0.00 - 0.07 K/uL    Comment: Performed at [facility], [address] [place], [place] [address]  Protime-INR     Status: None   Collection Time: 02/01/19  2:22 PM  Result Value Ref Range   Prothrombin Time 12.7 11.4 - 15.2 seconds   INR 1.0 0.8 - 1.2    Comment: (NOTE) INR goal varies based on device and disease states. Performed at [facility], [address] [place], [place] [address]    Dg Chest Port 1 View  Result Date: 02/01/2019 CLINICAL DATA:  Recent hospitalization for pneumonia. EXAM: PORTABLE CHEST 1 VIEW COMPARISON:  January 18, 2019 FINDINGS: No pneumothorax. The cardiomediastinal silhouette is stable. Vascular crowding in the medial right lung base. Left retrocardiac opacity may represent a hiatal hernia. No other acute abnormalities. IMPRESSION: 1. Left retrocardiac opacity may represent a hiatal hernia versus infiltrate. Recommend a PA and lateral chest x-ray for better evaluation. Electronically Signed   By: Gerome Samavid  Williams III M.D   On: 02/01/2019 15:17    Pending Labs Unresulted Labs (From  admission, onward)    Start     Ordered   02/01/19 1646  MRSA PCR Screening  Once,   STAT    Question:  Patient immune status  Answer:  Normal   02/01/19 1645   02/01/19 1515  Influenza panel by PCR (type A & B)  (Influenza PCR Panel)  Once,   STAT     02/01/19 1514   02/01/19 1410  Lactic acid, plasma  Now then every 2 hours,   STAT     02/01/19 1409   02/01/19 1410  Culture, blood (Routine x 2)  BLOOD CULTURE X 2,   STAT     02/01/19 1409   02/01/19 1410  Urinalysis, Complete w Microscopic  ONCE - STAT,   STAT     02/01/19 1409   Signed and Held  Creatinine, serum  (heparin)  Once,   R    Comments:  Baseline for heparin therapy IF NOT ALREADY DRAWN.    Signed and Held   Signed and Held  Basic metabolic panel  Tomorrow morning,   R     Signed and Held   Signed and Held  CBC  Tomorrow morning,   R     Signed and Held   Signed and Held  Procalcitonin  ONCE - STAT,   R     Signed and Held   Signed and Held  Protime-INR  ONCE - STAT,   R     Signed and Held   Signed and Held  APTT  ONCE - STAT,   R  Signed and Held   Signed and Held  Expectorated sputum assessment w rflx to resp cult  Once,   R    Question:  Patient immune status  Answer:  Normal   Signed and Held          Vitals/Pain Today's Vitals   02/01/19 1406 02/01/19 1408 02/01/19 1430 02/01/19 1534  BP: (!) 98/47   (!) 81/41  Pulse:    98  Resp:    13  Temp: 100 F (37.8 C)   (!) 101.6 F (38.7 C)  TempSrc: Oral   Oral  SpO2:    (!) 89%  Weight:  43.5 kg    Height:  4\' 10"  (1.473 m)    PainSc:  3  3  0-No pain    Isolation Precautions Droplet precaution  Medications Medications  vancomycin (VANCOCIN) IVPB 1000 mg/200 mL premix (has no administration in time range)  ceFEPIme (MAXIPIME) 1 g in sodium chloride 0.9 % 100 mL IVPB (has no administration in time range)  sodium chloride flush (NS) 0.9 % injection 3 mL (3 mLs Intravenous Given 02/01/19 1533)  sodium chloride 0.9 % bolus 500 mL (500 mLs  Intravenous New Bag/Given 02/01/19 1442)  sodium chloride 0.9 % bolus 1,000 mL (1,000 mLs Intravenous New Bag/Given 02/01/19 1525)    And  sodium chloride 0.9 % bolus 500 mL (500 mLs Intravenous New Bag/Given 02/01/19 1545)  ceFEPIme (MAXIPIME) 2 g in sodium chloride 0.9 % 100 mL IVPB (2 g Intravenous New Bag/Given 02/01/19 1528)    Mobility walks Low fall risk   Focused Assessments .   R Recommendations: See Admitting Provider Note  Report given to:   Additional Notes:

## 2019-02-01 NOTE — H&P (Addendum)
[facility] [place] NAME: Cheryl [name]    MR#:  [id number]  DATE OF BIRTH:  1951/07/13  DATE OF ADMISSION:  02/01/2019  PRIMARY CARE PHYSICIAN: Birdie Sons, MD   REQUESTING/REFERRING PHYSICIAN: Dr. Archie Balboa.  CHIEF COMPLAINT:   Chief Complaint  Patient presents with  . Fever   Fever, chills, cough and shortness of breath today HISTORY OF PRESENT ILLNESS:  Cheryl [name]  is a 68 y.o. female with a known history of multiple medical problems as below.  The patient presented the ED with above chief complaints.  Has history of chronic respiratory failure on home oxygen 3 L.  She had admission for pneumonia last month.  Also complains of generalized weakness and myalgia.  She is found septic, hypoxia and the chest x-ray showed left-sided pneumonia.  She is treated with cefepime and vancomycin in the ED. PAST MEDICAL HISTORY:   Past Medical History:  Diagnosis Date  . Acute postoperative pain 04/14/2017  . Allergy   . Anemia   . Anxiety   . Arthritis    fingers  . Aspiration pneumonitis ([place]) 11/24/2015  . Asthma   . CHF (congestive heart failure) ([place])   . Chronic pain   . COPD (chronic obstructive pulmonary disease) ([place])   . Coronary artery disease   . DVT (deep venous thrombosis) ([place])   . Dyspnea   . Dysrhythmia   . Encephalopathy    MULTIPLE TIMES  . GERD (gastroesophageal reflux disease)   . Headache   . Hyperlipidemia   . Hypertension   . Kyphoscoliosis deformity of spine   . Migraines   . Neuropathy 2010  . Osteoporosis   . Oxygen deficiency   . Peripheral vascular disease ([place])   . Pneumonia   . Pneumonia 11/19/2015  . Pneumonia 10/2016  . Pneumonia    aspiration 2018- 4 times in last year  . Rib fracture 09/19/2017   Displace right 9th rib fracture.   . Vitamin D deficiency   . Wears dentures    full upper and lower    PAST SURGICAL HISTORY:   Past Surgical History:  Procedure Laterality Date  .  ABDOMINAL HYSTERECTOMY  1975   Bilaterl Oophorectomy; [place] to IUD infection  . abdomnal aortic stent  05/30/2008   Dr. Quay Burow  . APPENDECTOMY    . APPENDECTOMY    . BACK SURGERY    . cardiac catherization  10/31/2009  . CARDIAC CATHETERIZATION    . CATARACT EXTRACTION W/PHACO Left 08/20/2018   Procedure: CATARACT EXTRACTION PHACO AND INTRAOCULAR LENS PLACEMENT (IOC);  Surgeon: Eulogio Bear, MD;  Location: [facility] ORS;  Service: Ophthalmology;  Laterality: Left;  [place] 00:45.1 AP% 12.3 CDE 5.54 FLUID PACK LOT # [id number]  . CATARACT EXTRACTION W/PHACO Right 09/23/2018   Procedure: CATARACT EXTRACTION PHACO AND INTRAOCULAR LENS PLACEMENT ([place]);  Surgeon: Eulogio Bear, MD;  Location: [facility] ORS;  Service: Ophthalmology;  Laterality: Right;  [place] 00:45.0 CDE 5.19 Fluid Pack lot # [id number]  . CERVICAL FUSION  C5 - 6/C6-7  . CHOLECYSTECTOMY  1972  . COLONOSCOPY WITH PROPOFOL N/A 07/27/2015   Procedure: COLONOSCOPY WITH PROPOFOL;  Surgeon: Hulen Luster, MD;  Location: [facility] ENDOSCOPY;  Service: Gastroenterology;  Laterality: N/A;  . ESOPHAGOGASTRODUODENOSCOPY (EGD) WITH PROPOFOL N/A 07/27/2015   Procedure: ESOPHAGOGASTRODUODENOSCOPY (EGD) WITH PROPOFOL;  Surgeon: Hulen Luster, MD;  Location: [facility] ENDOSCOPY;  Service: Gastroenterology;  Laterality: N/A;  . ESOPHAGOGASTRODUODENOSCOPY (EGD) WITH PROPOFOL N/A 05/19/2018  Procedure: ESOPHAGOGASTRODUODENOSCOPY (EGD) WITH PROPOFOL;  Surgeon: Lucilla Lame, MD;  Location: [facility] ENDOSCOPY;  Service: Endoscopy;  Laterality: N/A;  . EYE SURGERY    . FOOT SURGERY Bilateral    5-6 years per patient  . SPINE SURGERY    . TONSILLECTOMY    . VASCULAR SURGERY     LEG STENTS    SOCIAL HISTORY:   Social History   Tobacco Use  . Smoking status: Current Every Day Smoker    Packs/day: 0.50    Years: 50.00    Pack years: 25.00    Types: Cigarettes  . Smokeless tobacco: Never Used  . Tobacco comment: Previously smoked 2 ppd  Substance Use Topics  .  Alcohol use: No    Alcohol/week: 0.0 standard drinks    FAMILY HISTORY:   Family History  Problem Relation Age of Onset  . Cancer Mother   . Arthritis Mother   . Heart disease Mother   . Diabetes Mother        mellitus, type 2  . Heart disease Father   . Diabetes Sister   . Cancer Brother   . Cancer Brother        lung  . Diabetes Brother     DRUG ALLERGIES:   Allergies  Allergen Reactions  . Percocet [Oxycodone-Acetaminophen] Hives and Rash  . Aspirin Nausea And Vomiting and Other (See Comments)  . Codeine Nausea And Vomiting, Nausea Only and Other (See Comments)  . Propoxyphene Nausea Only and Other (See Comments)  . Sulfa Antibiotics Rash and Other (See Comments)    REVIEW OF SYSTEMS:   Review of Systems  Constitutional: Positive for chills, fever and malaise/fatigue.  HENT: Negative for sore throat.   Eyes: Negative for blurred vision and double vision.  Respiratory: Positive for cough, sputum production and shortness of breath. Negative for hemoptysis, wheezing and stridor.   Cardiovascular: Negative for chest pain, palpitations, orthopnea and leg swelling.  Gastrointestinal: Negative for abdominal pain, blood in stool, diarrhea, melena, nausea and vomiting.  Genitourinary: Negative for dysuria, flank pain and hematuria.  Musculoskeletal: Negative for back pain and joint pain.  Skin: Negative for rash.  Neurological: Negative for dizziness, sensory change, focal weakness, seizures, loss of consciousness, weakness and headaches.  Endo/Heme/Allergies: Negative for polydipsia.  Psychiatric/Behavioral: Negative for depression. The patient is not nervous/anxious.     MEDICATIONS AT HOME:   Prior to Admission medications   Medication Sig Start Date End Date Taking? Authorizing Provider  acetaminophen (TYLENOL) 325 MG tablet Take 2 tablets (650 mg total) by mouth every 6 (six) hours as needed for mild pain (or Fever >/= 101). 05/21/18   Loletha Grayer, MD    albuterol (PROVENTIL) (2.5 MG/3ML) 0.083% nebulizer solution Take 3 mLs (2.5 mg total) by nebulization every 4 (four) hours as needed for wheezing. 12/24/17   Birdie Sons, MD  alendronate (FOSAMAX) 70 MG tablet Take 1 tablet (70 mg total) by mouth once a week. Take with a full glass of water on an empty stomach. 12/04/18   Birdie Sons, MD  ALPRAZolam Duanne Moron) 0.5 MG tablet Take 0.5-1 tablets (0.25-0.5 mg total) by mouth at bedtime as needed for anxiety. 08/19/18   Birdie Sons, MD  amLODipine (NORVASC) 5 MG tablet Take 1 tablet (5 mg total) by mouth daily. 12/09/18   Birdie Sons, MD  atorvastatin (LIPITOR) 40 MG tablet Take 1 tablet (40 mg total) by mouth daily at 6 PM. 11/08/18   Demetrios Loll, MD  baclofen (  LIORESAL) 10 MG tablet Take 1 tablet (10 mg total) by mouth 3 (three) times daily as needed for muscle spasms. 08/17/18   Triplett, Cari B, FNP  BELSOMRA 5 MG TABS Take 1 tablet by mouth at bedtime as needed (for insomnia). Patient taking differently: Take 5 mg by mouth at bedtime as needed (insomnia).  11/16/18   Birdie Sons, MD  benzonatate (TESSALON PERLES) 100 MG capsule Take 1 capsule (100 mg total) by mouth 3 (three) times daily as needed for cough. 11/10/18   Birdie Sons, MD  celecoxib (CELEBREX) 100 MG capsule Take 100 mg by mouth 2 (two) times daily as needed. 11/23/18   [provider]  clopidogrel (PLAVIX) 75 MG tablet Take 75 mg by mouth daily.    [provider]  diphenoxylate-atropine (LOMOTIL) 2.5-0.025 MG tablet Take 1 tablet by mouth as needed for diarrhea or loose stools. 06/10/18   [provider]  donepezil (ARICEPT) 10 MG tablet Take 10 mg by mouth daily. 01/08/19   [provider]  DULoxetine (CYMBALTA) 30 MG capsule Take 30 mg by mouth daily. 01/12/19   [provider]  estradiol (ESTRACE) 0.5 MG tablet Take 1 tablet (0.5 mg total) by mouth every other day. 09/09/18   Birdie Sons, MD  fentaNYL (DURAGESIC -  DOSED MCG/HR) 50 MCG/HR Place 1 patch (50 mcg total) onto the skin every 3 (three) days. 11/29/18 01/15/19  Vevelyn Francois, NP  ferrous sulfate 325 (65 FE) MG tablet Take 1 tablet (325 mg total) by mouth 2 (two) times daily with a meal. 01/11/19   Birdie Sons, MD  Fluticasone-Salmeterol (ADVAIR DISKUS) 250-50 MCG/DOSE AEPB TAKE ONE PUFF TWICE DAILY. 09/28/18   Birdie Sons, MD  furosemide (LASIX) 20 MG tablet Take 1 tablet (20 mg total) by mouth every other day. 11/08/18   Demetrios Loll, MD  memantine (NAMENDA) 5 MG tablet Take 5 mg by mouth 2 (two) times daily.    [provider]  mirtazapine (REMERON SOL-TAB) 15 MG disintegrating tablet Take 1 tablet (15 mg total) by mouth at bedtime. 01/11/19   Birdie Sons, MD  NARCAN 4 MG/0.1ML LIQD nasal spray kit Place 0.4 mg into the nose daily as needed (for accidental overdose.).  09/11/17   [provider]  ondansetron (ZOFRAN) 4 MG tablet Take 4 mg by mouth as needed. 08/04/18   [provider]  pantoprazole (PROTONIX) 40 MG tablet Take 40 mg by mouth 2 (two) times daily. Take 1 tablet (40 mg) by mouth scheduled every morning, may repeat dose in evening if needed for heartburn/indigestion.    [provider]  potassium chloride (K-DUR,KLOR-CON) 10 MEQ tablet Take 10 mEq by mouth daily. 12/31/18   [provider]  predniSONE (DELTASONE) 10 MG tablet Take 40 mg p.o. daily x2 days Then 30 mg p.o. daily x2 days Then 20 mg p.o. daily x2 days Then 10 mg p.o. daily x2 days Patient not taking: Reported on 01/28/2019 01/20/19   Otila Back, MD  pregabalin (LYRICA) 300 MG capsule Take 300 mg by mouth 2 (two) times daily.    [provider]  silver sulfADIAZINE (SILVADENE) 1 % cream Apply 1 application topically daily as needed (leg infection).     [provider]  tiotropium (SPIRIVA HANDIHALER) 18 MCG inhalation capsule inhale the contents of one capsule in the handihaler once daily 01/25/19   Birdie Sons, MD  triamcinolone (KENALOG) 0.025 % ointment Apply 1 application topically 2 (  two) times daily. Patient taking differently: Apply 1 application topically 2 (two) times daily as needed (cracking skin).  06/15/18   Birdie Sons, MD  VENTOLIN HFA 108 [phone] Base) MCG/ACT inhaler Inhale 2 puffs into the lungs every 4 (four) hours as needed for wheezing or shortness of breath. Patient taking differently: Inhale 2 puffs into the lungs every 4 (four) hours as needed for wheezing or shortness of breath.  08/21/18   Birdie Sons, MD  vitamin B-12 (CYANOCOBALAMIN) 1000 MCG tablet Take 1 tablet (1,000 mcg total) by mouth daily. 05/21/18   Loletha Grayer, MD      VITAL SIGNS:  Blood pressure (!) 81/41, pulse 98, temperature (!) 101.6 F (38.7 C), temperature source Oral, resp. rate 13, height _0  (1.473 m), weight 43.5 kg, SpO2 (!) 89 %.  PHYSICAL EXAMINATION:  Physical Exam  GENERAL:  68 y.o.-year-old patient lying in the bed with no acute distress.  EYES: Pupils equal, round, reactive to light and accommodation. No scleral icterus. Extraocular muscles intact.  HEENT: Head atraumatic, normocephalic. Oropharynx and nasopharynx clear.  NECK:  Supple, no jugular venous distention. No thyroid enlargement, no tenderness.  LUNGS: Left-sided crackles, no wheezing. No use of accessory muscles of respiration.  CARDIOVASCULAR: S1, S2 normal. No murmurs, rubs, or gallops.  ABDOMEN: Soft, nontender, nondistended. Bowel sounds present. No organomegaly or mass.  EXTREMITIES: No pedal edema, cyanosis, or clubbing.  NEUROLOGIC: Cranial nerves II through XII are intact. Muscle strength 5/5 in all extremities. Sensation intact. Gait not checked.  PSYCHIATRIC: The patient is alert and oriented x 3.  SKIN: No obvious rash, lesion, or ulcer.   LABORATORY PANEL:   CBC Recent Labs  Lab 02/01/19 1422  WBC 17.4*  HGB 11.1*  HCT 35.6*  PLT 219    ------------------------------------------------------------------------------------------------------------------  Chemistries  Recent Labs  Lab 02/01/19 1422  NA 137  K 5.1  CL 96*  CO2 30  GLUCOSE 92  BUN 29*  CREATININE 1.69*  CALCIUM 8.0*  AST 20  ALT 18  ALKPHOS 59  BILITOT 0.9   ------------------------------------------------------------------------------------------------------------------  Cardiac Enzymes No results for input(s): TROPONINI in the last 168 hours. ------------------------------------------------------------------------------------------------------------------  RADIOLOGY:  Dg Chest Port 1 View  Result Date: 02/01/2019 CLINICAL DATA:  Recent hospitalization for pneumonia. EXAM: PORTABLE CHEST 1 VIEW COMPARISON:  January 18, 2019 FINDINGS: No pneumothorax. The cardiomediastinal silhouette is stable. Vascular crowding in the medial right lung base. Left retrocardiac opacity may represent a hiatal hernia. No other acute abnormalities. IMPRESSION: 1. Left retrocardiac opacity may represent a hiatal hernia versus infiltrate. Recommend a PA and lateral chest x-ray for better evaluation. Electronically Signed   By: Dorise Bullion III M.D   On: 02/01/2019 15:17      IMPRESSION AND PLAN:   Acute on chronic respiratory failure with hypoxia due to sepsis and pneumonia. The patient will be admitted to medical floor. Continue oxygen by nasal cannula, DuoNeb every 6 hours, Robitussin as needed, continue cefepime and vancomycin pharmacy to dose, follow-up with CBC and cultures.  Acute renal failure due to dehydration.  Hold diuretics, start IV fluid and follow-up BMP.  Hypotension.  Hold hypertension medication and diuretics.  IV fluid support.  COPD.  Stable.  Continue home nebulizer. Tobacco abuse.  Smoking cessation was counseled for 3 to 4 minutes.  All the records are reviewed and case discussed with ED provider. Management plans discussed with the  patient, her brother and sister and they are in agreement.  CODE STATUS: Full code.  TOTAL TIME TAKING CARE OF THIS PATIENT: 36 minutes.    Demetrios Loll M.D on 02/01/2019 at 4:02 PM  Between 7am to 6pm - Pager - [phone]  After 6pm go to [email or web address] - Proofreader  [facility]  Office  [phone]  CC: Primary care physician; Birdie Sons, MD   Note: This dictation was prepared with Dragon dictation along with smaller phrase technology. Any transcriptional errors that result from this process are unin

## 2019-02-01 NOTE — ED Notes (Signed)
attempting to restart 2nd IV.

## 2019-02-01 NOTE — Telephone Encounter (Signed)
Patient admitted

## 2019-02-01 NOTE — ED Provider Notes (Signed)
[facility] Emergency Department Provider Note  ____________________________________________   I have reviewed the triage vital signs and the nursing notes.   HISTORY  Chief Complaint Fever   History limited by: Not Limited   HPI Cheryl [name] is a 68 y.o. female who presents to the emergency department today with concern that her pneumonia has returned.  Patient states that she had a recent admission to the hospital for pneumonia.  Starting last night she started having return of symptoms.  She describes fevers, shortness of breath, cough and weakness.  She is also had myalgias.  Patient is on oxygen at home as needed however since her recent discharge she has been on it around the clock.  The patient is trying to quit smoking.  Per medical record review patient has a history of admission to hospital last month for pneumonia.   Past Medical History:  Diagnosis Date  . Acute postoperative pain 04/14/2017  . Allergy   . Anemia   . Anxiety   . Arthritis    fingers  . Aspiration pneumonitis (HCC) 11/24/2015  . Asthma   . CHF (congestive heart failure) (HCC)   . Chronic pain   . COPD (chronic obstructive pulmonary disease) (HCC)   . Coronary artery disease   . DVT (deep venous thrombosis) (HCC)   . Dyspnea   . Dysrhythmia   . Encephalopathy    MULTIPLE TIMES  . GERD (gastroesophageal reflux disease)   . Headache   . Hyperlipidemia   . Hypertension   . Kyphoscoliosis deformity of spine   . Migraines   . Neuropathy 2010  . Osteoporosis   . Oxygen deficiency   . Peripheral vascular disease (HCC)   . Pneumonia   . Pneumonia 11/19/2015  . Pneumonia 10/2016  . Pneumonia    aspiration 2018- 4 times in last year  . Rib fracture 09/19/2017   Displace right 9th rib fracture.   . Vitamin D deficiency   . Wears dentures    full upper and lower    Patient Active Problem List   Diagnosis Date Noted  . Acute respiratory failure (HCC) 01/15/2019  .  Aspiration pneumonia (HCC) 01/01/2019  . Hiatal hernia 12/24/2018  . Sepsis (HCC) 11/03/2018  . Fever   . Respiratory distress   . NSTEMI (non-ST elevated myocardial infarction) (HCC)   . Chronic lower extremity pain (Primary Area of Pain) (Bilateral) (L>R) 09/03/2018  . Chronic hip pain (Fourth Area of Pain) (Bilateral) (L>R) 09/03/2018  . Osteoarthritis of hip (Bilateral) 09/03/2018  . Chronic sacroiliac joint pain (Tertiary Area of Pain) (Bilateral) (L>R) 09/03/2018  . Sacroiliac joint dysfunction (Bilateral) 09/03/2018  . Spondylosis without myelopathy or radiculopathy, lumbosacral region 09/03/2018  . Other specified dorsopathies, sacral and sacrococcygeal region 09/03/2018  . Disorder of skeletal system 09/02/2018  . Problems influencing health status 09/02/2018  . DDD (degenerative disc disease), lumbar 09/02/2018  . Abnormal CT scan, lumbar spine (08/04/2017) 09/02/2018  . Levoscoliosis (Severe) 09/02/2018  . 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 Lumbar Grade 1 Anterolisthesis of L3 over L4 and L4 over L5 09/02/2018  . Lumbar foraminal stenosis (Bilateral: L3-4, L4-5) (Severe Right L4-5) 09/02/2018  . Lumbar facet arthropathy (Bilateral) 09/02/2018  . Lumbar facet osteoarthritis (Bilateral) 09/02/2018  . Lumbar spondylosis 09/02/2018  . Cervical spondylosis 09/02/2018  . Thoracic spondylosis 09/02/2018  . DDD (degenerative disc disease), thoracic 09/02/2018  . DDD (degenerative disc disease), cervical 09/02/2018  . Pharmacologic therapy 09/01/2018  .   Spondylosis without myelopathy or radiculopathy, lumbar region 09/01/2018  . Anorexia 07/07/2018  . 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 Goals of care, counseling/discussion   . Palliative care by specialist   . Acute encephalopathy 06/18/2018  . Iron deficiency anemia 06/09/2018  . Gastritis without bleeding   . Hypoxia 05/14/2018  . Generalized weakness 02/08/2018   . Marijuana use 06/05/2017  . Pneumonia 04/29/2017  . Metabolic encephalopathy 12/23/2016  . Hypokalemia 12/23/2016  . Protein-calorie malnutrition, severe 11/06/2016  . Nausea with vomiting   . Lumbar facet syndrome (Bilateral) (L>R) 03/20/2016  . Opiate use (160 MME/Day) 02/28/2016  . Anemia 02/28/2016  . Osteoarthrosis 12/18/2015  . Long term current use of anticoagulant therapy (Plavix) 12/18/2015  . Long term current use of opiate analgesic 12/04/2015  . Fibromyalgia 12/04/2015  . Dysphagia 11/24/2015  . Acute diastolic CHF (congestive heart failure) (HCC) 11/24/2015  . Leukocytosis 11/24/2015  . Abnormal mammogram of right breast 10/11/2015  . Dilated intrahepatic bile duct 10/11/2015  . Chronic lumbar radicular pain (Bilateral) (L>R) (L5) 09/04/2015  . Chronic low back pain (Secondary Area of Pain) (Bilateral) (L>R) 09/04/2015  . Encounter for therapeutic drug level monitoring 09/04/2015  . Uncomplicated opioid dependence (HCC) 09/04/2015  . Chronic pain syndrome 09/04/2015  . Platelet inhibition due to Plavix 09/04/2015  . COPD with acute exacerbation (HCC) 07/31/2015  . Dementia (HCC) 07/31/2015  . Airway hyperreactivity 07/03/2015  . Chronic thoracic back pain 07/03/2015  . Excessive falling 07/03/2015  . Alteration in bowel elimination: incontinence 07/03/2015  . Insomnia 07/03/2015  . Decreased testosterone level 07/03/2015  . Leg weakness 07/03/2015  . Menopausal symptom 07/03/2015  . Migraine 07/03/2015  . Neuropathy (HCC) 07/03/2015  . Fecal occult blood test positive 07/03/2015  . OP (osteoporosis) 07/03/2015  . Panic disorder 07/03/2015  . Shortness of breath 07/03/2015  . Compulsive tobacco user syndrome 07/03/2015  . Urinary incontinence 07/03/2015  . Weight loss 07/03/2015  . Essential hypertension 06/06/2015  . GERD (gastroesophageal reflux disease) 06/06/2015  . Hyperlipemia 06/06/2015  . Peripheral nerve disease 04/13/2014  . Anxiety 02/10/2014   . Coronary artery disease 02/10/2014  . Peripheral vascular disease (HCC) 02/10/2014  . Vitamin D deficiency 10/16/2009    Past Surgical History:  Procedure Laterality Date  . ABDOMINAL HYSTERECTOMY  1975   Bilaterl Oophorectomy; Dur to IUD infection  . abdomnal aortic stent  05/30/2008   Dr. Jonathan Berry  . APPENDECTOMY    . APPENDECTOMY    . BACK SURGERY    . cardiac catherization  10/31/2009  . CARDIAC CATHETERIZATION    . CATARACT EXTRACTION W/PHACO Left 08/20/2018   Procedure: CATARACT EXTRACTION PHACO AND INTRAOCULAR LENS PLACEMENT (IOC);  Surgeon: King, Bradley Mark, MD;  Location: [facility] ORS;  Service: Ophthalmology;  Laterality: Left;  US 00:45.1 AP% 12.3 CDE 5.54 FLUID PACK LOT # [id number]  . CATARACT EXTRACTION W/PHACO Right 09/23/2018   Procedure: CATARACT EXTRACTION PHACO AND INTRAOCULAR LENS PLACEMENT (IOC);  Surgeon: King, Bradley Mark, MD;  Location: [facility] ORS;  Service: Ophthalmology;  Laterality: Right;  US 00:45.0 CDE 5.19 Fluid Pack lot # [id number]  . CERVICAL FUSION  C5 - 6/C6-7  . CHOLECYSTECTOMY  1972  . COLONOSCOPY WITH PROPOFOL N/A 07/27/2015   Procedure: COLONOSCOPY WITH PROPOFOL;  Surgeon: Paul Y Oh, MD;  Location: [facility] ENDOSCOPY;  Service: Gastroenterology;  Laterality: N/A;  . ESOPHAGOGASTRODUODENOSCOPY (EGD) WITH PROPOFOL N/A 07/27/2015   Procedure: ESOPHAGOGASTRODUODENOSCOPY (EGD) WITH PROPOFOL;  Surgeon: Paul Y Oh, MD;  Location: [facility] ENDOSCOPY;    Service: Gastroenterology;  Laterality: N/A;  . ESOPHAGOGASTRODUODENOSCOPY (EGD) WITH PROPOFOL N/A 05/19/2018   Procedure: ESOPHAGOGASTRODUODENOSCOPY (EGD) WITH PROPOFOL;  Surgeon: Wohl, Darren, MD;  Location: [facility] ENDOSCOPY;  Service: Endoscopy;  Laterality: N/A;  . EYE SURGERY    . FOOT SURGERY Bilateral    5-6 years per patient  . SPINE SURGERY    . TONSILLECTOMY    . VASCULAR SURGERY     LEG STENTS    Prior to Admission medications   Medication Sig Start Date End Date Taking? Authorizing Provider   acetaminophen (TYLENOL) 325 MG tablet Take 2 tablets (650 mg total) by mouth every 6 (six) hours as needed for mild pain (or Fever >/= 101). 05/21/18   Wieting, Richard, MD  albuterol (PROVENTIL) (2.5 MG/3ML) 0.083% nebulizer solution Take 3 mLs (2.5 mg total) by nebulization every 4 (four) hours as needed for wheezing. 12/24/17   Fisher, Donald E, MD  alendronate (FOSAMAX) 70 MG tablet Take 1 tablet (70 mg total) by mouth once a week. Take with a full glass of water on an empty stomach. 12/04/18   Fisher, Donald E, MD  ALPRAZolam (XANAX) 0.5 MG tablet Take 0.5-1 tablets (0.25-0.5 mg total) by mouth at bedtime as needed for anxiety. 08/19/18   Fisher, Donald E, MD  amLODipine (NORVASC) 5 MG tablet Take 1 tablet (5 mg total) by mouth daily. 12/09/18   Fisher, Donald E, MD  atorvastatin (LIPITOR) 40 MG tablet Take 1 tablet (40 mg total) by mouth daily at 6 PM. 11/08/18   Chen, Qing, MD  baclofen (LIORESAL) 10 MG tablet Take 1 tablet (10 mg total) by mouth 3 (three) times daily as needed for muscle spasms. 08/17/18   Triplett, Cari B, FNP  BELSOMRA 5 MG TABS Take 1 tablet by mouth at bedtime as needed (for insomnia). Patient taking differently: Take 5 mg by mouth at bedtime as needed (insomnia).  11/16/18   Fisher, Donald E, MD  benzonatate (TESSALON PERLES) 100 MG capsule Take 1 capsule (100 mg total) by mouth 3 (three) times daily as needed for cough. 11/10/18   Fisher, Donald E, MD  celecoxib (CELEBREX) 100 MG capsule Take 100 mg by mouth 2 (two) times daily as needed. 11/23/18   [provider]  clopidogrel (PLAVIX) 75 MG tablet Take 75 mg by mouth daily.    [provider]  diphenoxylate-atropine (LOMOTIL) 2.5-0.025 MG tablet Take 1 tablet by mouth as needed for diarrhea or loose stools. 06/10/18   [provider]  donepezil (ARICEPT) 10 MG tablet Take 10 mg by mouth daily. 01/08/19   [provider]  DULoxetine (CYMBALTA) 30 MG capsule Take 30 mg by mouth daily. 01/12/19    [provider]  estradiol (ESTRACE) 0.5 MG tablet Take 1 tablet (0.5 mg total) by mouth every other day. 09/09/18   Fisher, Donald E, MD  fentaNYL (DURAGESIC - DOSED MCG/HR) 50 MCG/HR Place 1 patch (50 mcg total) onto the skin every 3 (three) days. 11/29/18 01/15/19  King, Crystal M, NP  ferrous sulfate 325 (65 FE) MG tablet Take 1 tablet (325 mg total) by mouth 2 (two) times daily with a meal. 01/11/19   Fisher, Donald E, MD  Fluticasone-Salmeterol (ADVAIR DISKUS) 250-50 MCG/DOSE AEPB TAKE ONE PUFF TWICE DAILY. 09/28/18   Fisher, Donald E, MD  furosemide (LASIX) 20 MG tablet Take 1 tablet (20 mg total) by mouth every other day. 11/08/18   Chen, Qing, MD  memantine (NAMENDA) 5 MG tablet Take 5 mg by mouth 2 (  two) times daily.    [provider]  mirtazapine (REMERON SOL-TAB) 15 MG disintegrating tablet Take 1 tablet (15 mg total) by mouth at bedtime. 01/11/19   Birdie Sons, MD  NARCAN 4 MG/0.1ML LIQD nasal spray kit Place 0.4 mg into the nose daily as needed (for accidental overdose.).  09/11/17   [provider]  ondansetron (ZOFRAN) 4 MG tablet Take 4 mg by mouth as needed. 08/04/18   [provider]  pantoprazole (PROTONIX) 40 MG tablet Take 40 mg by mouth 2 (two) times daily. Take 1 tablet (40 mg) by mouth scheduled every morning, may repeat dose in evening if needed for heartburn/indigestion.    [provider]  potassium chloride (K-DUR,KLOR-CON) 10 MEQ tablet Take 10 mEq by mouth daily. 12/31/18   [provider]  predniSONE (DELTASONE) 10 MG tablet Take 40 mg p.o. daily x2 days Then 30 mg p.o. daily x2 days Then 20 mg p.o. daily x2 days Then 10 mg p.o. daily x2 days Patient not taking: Reported on 01/28/2019 01/20/19   Otila Back, MD  pregabalin (LYRICA) 300 MG capsule Take 300 mg by mouth 2 (two) times daily.    [provider]  silver sulfADIAZINE (SILVADENE) 1 % cream Apply 1 application topically daily as needed (leg infection).      [provider]  tiotropium (SPIRIVA HANDIHALER) 18 MCG inhalation capsule inhale the contents of one capsule in the handihaler once daily 01/25/19   Birdie Sons, MD  triamcinolone (KENALOG) 0.025 % ointment Apply 1 application topically 2 (two) times daily. Patient taking differently: Apply 1 application topically 2 (two) times daily as needed (cracking skin).  06/15/18   Birdie Sons, MD  VENTOLIN HFA 108 [phone] Base) MCG/ACT inhaler Inhale 2 puffs into the lungs every 4 (four) hours as needed for wheezing or shortness of breath. Patient taking differently: Inhale 2 puffs into the lungs every 4 (four) hours as needed for wheezing or shortness of breath.  08/21/18   Birdie Sons, MD  vitamin B-12 (CYANOCOBALAMIN) 1000 MCG tablet Take 1 tablet (1,000 mcg total) by mouth daily. 05/21/18   Loletha Grayer, MD    Allergies Percocet [oxycodone-acetaminophen]; Aspirin; Codeine; Propoxyphene; and Sulfa antibiotics  Family History  Problem Relation Age of Onset  . Cancer Mother   . Arthritis Mother   . Heart disease Mother   . Diabetes Mother        mellitus, type 2  . Heart disease Father   . Diabetes Sister   . Cancer Brother   . Cancer Brother        lung  . Diabetes Brother     Social History Social History   Tobacco Use  . Smoking status: Current Every Day Smoker    Packs/day: 0.50    Years: 50.00    Pack years: 25.00    Types: Cigarettes  . Smokeless tobacco: Never Used  . Tobacco comment: Previously smoked 2 ppd  Substance Use Topics  . Alcohol use: No    Alcohol/week: 0.0 standard drinks  . Drug use: No    Review of Systems Constitutional: Positive for fever. Eyes: No visual changes. ENT: No sore throat. Cardiovascular: Denies chest pain. Respiratory: Positive for shortness of breath and fever. Gastrointestinal: No abdominal pain.  No nausea, no vomiting.  No diarrhea.   Genitourinary: Negative for dysuria. Musculoskeletal: Positive for muscle  aches.  Skin: Negative for rash. Neurological: Negative for headaches, focal weakness or numbness.  ____________________________________________  PHYSICAL EXAM:  VITAL SIGNS: ED Triage Vitals  Enc Vitals Group     BP 02/01/19 1406 (!) 98/47     Pulse --      Resp --      Temp 02/01/19 1406 100 F (37.8 C)     Temp Source 02/01/19 1406 Oral     SpO2 --      Weight 02/01/19 1408 96 lb (43.5 kg)     Height 02/01/19 1408 4' 10" (1.473 m)     Head Circumference --      Peak Flow --      Pain Score 02/01/19 1408 3   Constitutional: Alert and oriented.  Eyes: Conjunctivae are normal.  ENT      Head: Normocephalic and atraumatic.      Nose: No congestion/rhinnorhea.      Mouth/Throat: Mucous membranes are moist.      Neck: No stridor. Hematological/Lymphatic/Immunilogical: No cervical lymphadenopathy. Cardiovascular: Normal rate, regular rhythm.  No murmurs, rubs, or gallops.  Respiratory: Normal respiratory effort without tachypnea nor retractions. Breath sounds are clear and equal bilaterally. No wheezes/rales/rhonchi. Gastrointestinal: Soft and non tender. No rebound. No guarding.  Genitourinary: Deferred Musculoskeletal: Normal range of motion in all extremities. No lower extremity edema. Neurologic:  Normal speech and language. No gross focal neurologic deficits are appreciated.  Skin:  Skin is warm, dry and intact. No rash noted. Psychiatric: Mood and affect are normal. Speech and behavior are normal. Patient exhibits appropriate insight and judgment.  ____________________________________________    LABS (pertinent positives/negatives)  Lactic 1.3 CMP na 137, k 5.1, cr 1.69 CBC wbc 17.4, hgb 11.1, plt 219  ____________________________________________   EKG  None  ____________________________________________    RADIOLOGY  CXR Concern for possible infiltrate  ____________________________________________   PROCEDURES  Procedures  CRITICAL  CARE Performed by:     Total critical care time: 30 minutes  Critical care time was exclusive of separately billable procedures and treating other patients.  Critical care was necessary to treat or prevent imminent or life-threatening deterioration.  Critical care was time spent personally by me on the following activities: development of treatment plan with patient and/or surrogate as well as nursing, discussions with consultants, evaluation of patient's response to treatment, examination of patient, obtaining history from patient or surrogate, ordering and performing treatments and interventions, ordering and review of laboratory studies, ordering and review of radiographic studies, pulse oximetry and re-evaluation of patient's condition.  ____________________________________________   INITIAL IMPRESSION / ASSESSMENT AND PLAN / ED COURSE  Pertinent labs & imaging results that were available during my care of the patient were reviewed by me and considered in my medical decision making (see chart for details).   Patient presented to the emergency department today because of concerns for fever shortness of breath cough and weakness.  The symptoms started again yesterday.  Had similar symptoms and had a recent hospitalization 1 month ago for respiratory distress secondary to pneumonia.  Patient was febrile and somewhat hypotensive here.  Did have concerns for sepsis again.  Blood work does show elevated white blood cell count.  Chest x-ray is again concerning for pneumonia.  Given these findings patient was started on broad-spectrum IV antibiotics and multiple IV fluid boluses.  I discussed findings and concern for recurrent pneumonia with the patient.  Discussed need for admission.  I discussed with the hospitalist.  ____________________________________________   FINAL CLINICAL IMPRESSION(S) / ED DIAGNOSES  Final diagnoses:  Sepsis, due to unspecified organism, unspecified  whether acute   organ dysfunction present (HCC)  Hypotension, unspecified hypotension type  Pneumonia due to infectious organism, unspecified laterality, unspecified part of lung     Note: This dictation was prepared with Dragon dictation. Any transcriptional errors that result from this process are unintentional     Nance Pear, MD 02/01/19 [phone]

## 2019-02-01 NOTE — Telephone Encounter (Signed)
Lawson Fiscal, RN  w/ [facility] [phone]  Had pt sent to the hospital [facility]) because BP was 80/40.  Thanks, [employer]

## 2019-02-01 NOTE — ED Triage Notes (Signed)
[facility] home.  [facility]  Patient lives alone and home health aid called EMS.  EMS reports patients patient meets Sepsis criterea. Recent hosptialization for pneumonia.  @20  g SL left hand.  ETCO2:  23  T:  101.9  R:  20.  Patient is oxygen dependent at home, wears 2l/ [place].  Currently on 4l? [place].  Initial sats 88-92% on 3/L.  On 4L/ [place] sats 94-99%.

## 2019-02-01 NOTE — Care Management Note (Signed)
Case Management Note  Patient Details  Name: Cheryl [name] MRN: [id number] Date of Birth: [birth year]  Subjective/Objective:      Patient is being admitted to the hospital for sepsis and acute respiratory failure.  Patient recently discharged on 2/26.  Patient is on home O2 at 3 L.  Patient is open with [facility], Kandee Keen with [facility] aware of admission.  Patient counseled by MD and RN on importance of quitting smoking- patient reports that she is down to 5 cigarettes per day.  Patient lives with her husband and walks with a walker.  Patient and husband report that they have all necessary equipment at home, bedside commode and seat in the shower.  PCP verified as Dr. Sherrie Mustache, pharmacy is [facility].  RNCM will cont to follow and assist with discharge planning. Robbie Lis RN BSN Care Manager [phone]              Action/Plan:   Expected Discharge Date:                  Expected Discharge Plan:  Home w Home Health Services  In-House Referral:     Discharge planning Services  CM Consult  Post Acute Care Choice:    Choice offered to:     DME Arranged:    DME Agency:     HH Arranged:  RN, PT HH Agency:  (Open with [facility])  Status of Service:  In process, will continue to follow  If discussed at Long Length of Stay Meetings, dates discussed:    Additional Comments:  Allayne Butcher, RN 02/01/2019, 3:49 PM

## 2019-02-02 LAB — URINALYSIS, COMPLETE (UACMP) WITH MICROSCOPIC
Bacteria, UA: NONE SEEN
Bilirubin Urine: NEGATIVE
GLUCOSE, UA: NEGATIVE mg/dL
Hgb urine dipstick: NEGATIVE
Ketones, ur: NEGATIVE mg/dL
Leukocytes,Ua: NEGATIVE
Nitrite: NEGATIVE
PH: 5 (ref 5.0–8.0)
Protein, ur: NEGATIVE mg/dL
Specific Gravity, Urine: 1.011 (ref 1.005–1.030)

## 2019-02-02 LAB — EXPECTORATED SPUTUM ASSESSMENT W GRAM STAIN, RFLX TO RESP C: Special Requests: NORMAL

## 2019-02-02 LAB — MRSA PCR SCREENING: MRSA by PCR: NEGATIVE

## 2019-02-02 MED ORDER — BUDESONIDE 0.5 MG/2ML IN SUSP
0.5000 mg | Freq: Two times a day (BID) | RESPIRATORY_TRACT | Status: DC
  Administered 2019-02-02 – 2019-02-03 (×3): 0.5 mg via RESPIRATORY_TRACT
  Filled 2019-02-02 (×3): qty 2

## 2019-02-02 MED ORDER — ORAL CARE MOUTH RINSE
15.0000 mL | Freq: Two times a day (BID) | OROMUCOSAL | Status: DC
  Administered 2019-02-02: 15 mL via OROMUCOSAL

## 2019-02-02 MED ORDER — IPRATROPIUM-ALBUTEROL 0.5-2.5 (3) MG/3ML IN SOLN
3.0000 mL | Freq: Four times a day (QID) | RESPIRATORY_TRACT | Status: DC
  Administered 2019-02-02 – 2019-02-03 (×4): 3 mL via RESPIRATORY_TRACT
  Filled 2019-02-02 (×4): qty 3

## 2019-02-02 MED ORDER — SODIUM CHLORIDE 0.9 % IV SOLN
INTRAVENOUS | Status: DC | PRN
  Administered 2019-02-02: 250 mL via INTRAVENOUS

## 2019-02-02 MED ORDER — METHYLPREDNISOLONE SODIUM SUCC 40 MG IJ SOLR
40.0000 mg | Freq: Two times a day (BID) | INTRAMUSCULAR | Status: DC
  Administered 2019-02-02 (×2): 40 mg via INTRAVENOUS
  Filled 2019-02-02 (×2): qty 1

## 2019-02-02 NOTE — Progress Notes (Signed)
[facility] at [place] NAME: Cheryl [name]    MR#:  [id number]  DATE OF BIRTH:  18-Jul-1951  SUBJECTIVE:   Patient admitted to the hospital due to shortness of breath and sepsis secondary to pneumonia.  Feels a bit better today.  Patient's husband is at bedside.  REVIEW OF SYSTEMS:    Review of Systems  Constitutional: Negative for chills and fever.  HENT: Negative for congestion and tinnitus.   Eyes: Negative for blurred vision and double vision.  Respiratory: Positive for cough, sputum production (yellow) and shortness of breath. Negative for wheezing.   Cardiovascular: Negative for chest pain, orthopnea and PND.  Gastrointestinal: Negative for abdominal pain, diarrhea, nausea and vomiting.  Genitourinary: Negative for dysuria and hematuria.  Neurological: Negative for dizziness, sensory change and focal weakness.  All other systems reviewed and are negative.   Nutrition: Heart healthy/Carb modified Tolerating Diet: Yes Tolerating PT: Await Eval   DRUG ALLERGIES:   Allergies  Allergen Reactions  . Percocet [Oxycodone-Acetaminophen] Hives and Rash  . Aspirin Nausea And Vomiting and Other (See Comments)  . Codeine Nausea And Vomiting, Nausea Only and Other (See Comments)  . Propoxyphene Nausea Only and Other (See Comments)  . Sulfa Antibiotics Rash and Other (See Comments)    VITALS:  Blood pressure (!) 101/47, pulse 94, temperature 98.5 F (36.9 C), temperature source Oral, resp. rate 18, height '4\' 10"'$  (1.473 m), weight 43.5 kg, SpO2 94 %.  PHYSICAL EXAMINATION:   Physical Exam  GENERAL:  68 y.o.-year-old thin patient lying in bed in no acute distress.  EYES: Pupils equal, round, reactive to light and accommodation. No scleral icterus. Extraocular muscles intact.  HEENT: Head atraumatic, normocephalic. Oropharynx and nasopharynx clear.  NECK:  Supple, no jugular venous distention. No thyroid enlargement, no tenderness.    LUNGS: Good a/e b/l, no wheezing, rales, rhonchi. No use of accessory muscles of respiration.  CARDIOVASCULAR: S1, S2 normal. No murmurs, rubs, or gallops.  ABDOMEN: Soft, nontender, nondistended. Bowel sounds present. No organomegaly or mass.  EXTREMITIES: No cyanosis, clubbing or edema b/l.    NEUROLOGIC: Cranial nerves II through XII are intact. No focal Motor or sensory deficits b/l. Globally weak  PSYCHIATRIC: The patient is alert and oriented x 3.  SKIN: No obvious rash, lesion, or ulcer.    LABORATORY PANEL:   CBC Recent Labs  Lab 02/01/19 1422  WBC 17.4*  HGB 11.1*  HCT 35.6*  PLT 219   ------------------------------------------------------------------------------------------------------------------  Chemistries  Recent Labs  Lab 02/01/19 1422 02/01/19 1845  NA 137  --   K 5.1  --   CL 96*  --   CO2 30  --   GLUCOSE 92  --   BUN 29*  --   CREATININE 1.69* 1.48*  CALCIUM 8.0*  --   AST 20  --   ALT 18  --   ALKPHOS 59  --   BILITOT 0.9  --    ------------------------------------------------------------------------------------------------------------------  Cardiac Enzymes No results for input(s): TROPONINI in the last 168 hours. ------------------------------------------------------------------------------------------------------------------  RADIOLOGY:  Dg Chest Port 1 View  Result Date: 02/01/2019 CLINICAL DATA:  Recent hospitalization for pneumonia. EXAM: PORTABLE CHEST 1 VIEW COMPARISON:  January 18, 2019 FINDINGS: No pneumothorax. The cardiomediastinal silhouette is stable. Vascular crowding in the medial right lung base. Left retrocardiac opacity may represent a hiatal hernia. No other acute abnormalities. IMPRESSION: 1. Left retrocardiac opacity may represent a hiatal hernia versus infiltrate. Recommend a PA  and lateral chest x-ray for better evaluation. Electronically Signed   By: Dorise Bullion III M.D   On: 02/01/2019 15:17     ASSESSMENT AND  PLAN:   68 year old female with past medical history of hypertension, hyperlipidemia, osteoporosis, peripheral vascular disease, CHF, COPD, history of DVT who presents to the hospital due to shortness of breath.  1.  Sepsis-patient met criteria on admission due to leukocytosis, tachycardia and chest x-ray findings suggestive of pneumonia. - Continue IV cefepime, will DC vancomycin as patient's MRSA PCR is negative.  Follow cultures.  Patient is clinically improving.  2.  Pneumonia-source of patient's sepsis.  This is based off a chest x-ray and admission. -Continue IV cefepime, DC vancomycin as MRSA PCR is negative. -Follow cultures.  3.  History of peripheral vascular disease-continue Plavix, atorvastatin.  4.  COPD-mild acute exacerbation due to underlying pneumonia. - We will start some IV steroids, scheduled duo nebs, Pulmicort nebs.  Continue antibiotics as mentioned above.  5.  Neuropathy-continue Lyrica.  6.  Dementia-continue Namenda.  7.  Chronic pain-continue fentanyl patch.  8.  GERD-continue Protonix.     All the records are reviewed and case discussed with Care Management/Social Worker. Management plans discussed with the patient, family and they are in agreement.  CODE STATUS: Full code  DVT Prophylaxis: Hep SQ  TOTAL TIME TAKING CARE OF THIS PATIENT: 30 minutes.   POSSIBLE D/C IN 2-3 DAYS, DEPENDING ON CLINICAL CONDITION.   Henreitta Leber M.D on 02/02/2019 at 2:33 PM  Between 7am to 6pm - Pager - [phone]  After 6pm go to [email or web address] - Proofreader  [facility]  Office  [phone]  CC: Primary care physician; Birdie Sons, MD

## 2019-02-03 LAB — BASIC METABOLIC PANEL
Anion gap: 9 (ref 5–15)
BUN: 16 mg/dL (ref 8–23)
CO2: 27 mmol/L (ref 22–32)
Calcium: 8.7 mg/dL — ABNORMAL LOW (ref 8.9–10.3)
Chloride: 102 mmol/L (ref 98–111)
Creatinine, Ser: 0.58 mg/dL (ref 0.44–1.00)
GFR calc Af Amer: >60 mL/min (ref >60)
GFR calc non Af Amer: >60 mL/min (ref >60)
Glucose, Bld: 179 mg/dL — ABNORMAL HIGH (ref 70–99)
Potassium: 4.9 mmol/L (ref 3.5–5.1)
Sodium: 138 mmol/L (ref 135–145)

## 2019-02-03 LAB — CBC
HCT: 30 % — ABNORMAL LOW (ref 36.0–46.0)
HEMOGLOBIN: 9.1 g/dL — AB (ref 12.0–15.0)
MCH: 31 pg (ref 26.0–34.0)
MCHC: 30.3 g/dL (ref 30.0–36.0)
MCV: 102 fL — ABNORMAL HIGH (ref 80.0–100.0)
NRBC: 0 % (ref 0.0–0.2)
Platelets: 171 10*3/uL (ref 150–400)
RBC: 2.94 MIL/uL — AB (ref 3.87–5.11)
RDW: 14.6 % (ref 11.5–15.5)
WBC: 7.4 10*3/uL (ref 4.0–10.5)

## 2019-02-03 MED ORDER — LEVOFLOXACIN 750 MG PO TABS: 750.0000 mg | ORAL_TABLET | ORAL | Status: DC

## 2019-02-03 MED ORDER — BENZONATATE 100 MG PO CAPS: 100.0000 mg | ORAL_CAPSULE | Freq: Three times a day (TID) | ORAL | 1 refills | Status: AC | PRN

## 2019-02-03 MED ORDER — ALBUTEROL SULFATE (2.5 MG/3ML) 0.083% IN NEBU: 2.5000 mg | INHALATION_SOLUTION | RESPIRATORY_TRACT | Status: DC | PRN

## 2019-02-03 MED ORDER — IPRATROPIUM-ALBUTEROL 0.5-2.5 (3) MG/3ML IN SOLN: 3.0000 mL | Freq: Three times a day (TID) | RESPIRATORY_TRACT | Status: DC

## 2019-02-03 MED ORDER — LEVOFLOXACIN 750 MG PO TABS: 750.0000 mg | ORAL_TABLET | ORAL | 0 refills | Status: DC

## 2019-02-03 NOTE — Consult Note (Signed)
Pharmacy Antibiotic Note  Cheryl [name] is a 68 y.o. female admitted on 02/01/2019 with pneumonia.  Pharmacy has been consulted for Levofloxacin  dosing.  Plan: Levofloxacin 750mg  every 48 hours ordered. Recommended days of therapy 5-7 days   Height: 4\' 10"  (147.3 cm) Weight: 96 lb (43.5 kg) IBW/kg (Calculated) : 40.9  Temp (24hrs), Avg:98.2 F (36.8 C), Min:97.7 F (36.5 C), Max:98.5 F (36.9 C)  Recent Labs  Lab 02/01/19 1422 02/01/19 1845 02/03/19 0746  WBC 17.4*  --  7.4  CREATININE 1.69* 1.48* 0.58  LATICACIDVEN 1.3  --   --     Estimated Creatinine Clearance: 42.9 mL/min (by C-G formula based on SCr of 0.58 mg/dL).    Allergies  Allergen Reactions  . Percocet [Oxycodone-Acetaminophen] Hives and Rash  . Aspirin Nausea And Vomiting and Other (See Comments)  . Codeine Nausea And Vomiting, Nausea Only and Other (See Comments)  . Propoxyphene Nausea Only and Other (See Comments)  . Sulfa Antibiotics Rash and Other (See Comments)    Antimicrobials this admission: 3/9 Cefepime>> 3/11 3/9 vancomycin x 1 dose 3/11 levofloxacin   Microbiology results: 3/10 BCx: NG TD 3/10 Sputum: pending 3/10 MRSA PCR: pending  Thank you for allowing pharmacy to be a part of this patient's care.  Gardner Candle, PharmD, BCPS Clinical Pharmacist 02/03/2019 9:12 AM

## 2019-02-03 NOTE — Discharge Instructions (Signed)
Smoking cessation  

## 2019-02-03 NOTE — Progress Notes (Signed)
Discharge instructions given and went over with patient and patients husband at bedside. Prescriptions given and reviewed. All questions answered. Patient discharged home. Bo Mcclintock, RN

## 2019-02-03 NOTE — Discharge Summary (Signed)
[facility] [place] at [place] NAME: Cheryl [name]    MR#:  [id number]  DATE OF BIRTH:  [birth year]  DATE OF ADMISSION:  02/01/2019   ADMITTING PHYSICIAN: Demetrios Loll, MD  DATE OF DISCHARGE: 02/03/2019 PRIMARY CARE PHYSICIAN: Birdie Sons, MD   ADMISSION DIAGNOSIS:  Sepsis ([place]) [A41.9] Hypotension, unspecified hypotension type [I95.9] Pneumonia due to infectious organism, unspecified laterality, unspecified part of lung [J18.9] Sepsis, due to unspecified organism, unspecified whether acute organ dysfunction present ([place]) [A41.9] DISCHARGE DIAGNOSIS:  Active Problems:   Sepsis (Cheryl [name])  SECONDARY DIAGNOSIS:   Past Medical History:  Diagnosis Date  . Acute postoperative pain 04/14/2017  . Allergy   . Anemia   . Anxiety   . Arthritis    fingers  . Aspiration pneumonitis ([place]) 11/24/2015  . Asthma   . CHF (congestive heart failure) ([place])   . Chronic pain   . COPD (chronic obstructive pulmonary disease) ([place])   . Coronary artery disease   . DVT (deep venous thrombosis) ([place])   . Dyspnea   . Dysrhythmia   . Encephalopathy    MULTIPLE TIMES  . GERD (gastroesophageal reflux disease)   . Headache   . Hyperlipidemia   . Hypertension   . Kyphoscoliosis deformity of spine   . Migraines   . Neuropathy 2010  . Osteoporosis   . Oxygen deficiency   . Peripheral vascular disease ([place])   . Pneumonia   . Pneumonia 11/19/2015  . Pneumonia 10/2016  . Pneumonia    aspiration 2018- 4 times in last year  . Rib fracture 09/19/2017   Displace right 9th rib fracture.   . Vitamin D deficiency   . Wears dentures    full upper and lower   HOSPITAL COURSE:  68 year old female with past medical history of hypertension, hyperlipidemia, osteoporosis, peripheral vascular disease, CHF, COPD, history of DVT who presents to the hospital due to shortness of breath.  1.  Sepsis-patient met criteria on admission due to leukocytosis, tachycardia and chest x-ray  findings suggestive of pneumonia. She is treated with IV cefepime, DC vancomycin as patient's MRSA PCR is negative.  Follow cultures negative.  Patient is clinically improving. Change to p.o. Levaquin.  2.  Pneumonia-source of patient's sepsis.  This is based off a chest x-ray and admission. As above.  3.  History of peripheral vascular disease-continue Plavix, atorvastatin.  4.  COPD-mild acute exacerbation due to underlying pneumonia. She is treated with IV steroids, scheduled duo nebs, Pulmicort nebs.  Continue antibiotics as mentioned above.  Proved.  No need p.o. prednisone. Chronic respiratory failure, on home oxygen 3 L.  5.  Neuropathy-continue Lyrica.  6.  Dementia-continue Namenda.  7.  Chronic pain-continue fentanyl patch.  8.  GERD-continue Protonix.  DISCHARGE CONDITIONS:  Stable, discharge to home with home health today. CONSULTS OBTAINED:   DRUG ALLERGIES:   Allergies  Allergen Reactions  . Percocet [Oxycodone-Acetaminophen] Hives and Rash  . Aspirin Nausea And Vomiting and Other (See Comments)  . Codeine Nausea And Vomiting, Nausea Only and Other (See Comments)  . Propoxyphene Nausea Only and Other (See Comments)  . Sulfa Antibiotics Rash and Other (See Comments)   DISCHARGE MEDICATIONS:   Allergies as of 02/03/2019      Reactions   Percocet [oxycodone-acetaminophen] Hives, Rash   Aspirin Nausea And Vomiting, Other (See Comments)   Codeine Nausea And Vomiting, Nausea Only, Other (See Comments)   Propoxyphene Nausea Only, Other (See Comments)   Sulfa Antibiotics  Rash, Other (See Comments)      Medication List    STOP taking these medications   furosemide 20 MG tablet Commonly known as:  LASIX     TAKE these medications   acetaminophen 325 MG tablet Commonly known as:  TYLENOL Take 2 tablets (650 mg total) by mouth every 6 (six) hours as needed for mild pain (or Fever >/= 101).   albuterol (2.5 MG/3ML) 0.083% nebulizer solution Commonly  known as:  PROVENTIL Take 3 mLs (2.5 mg total) by nebulization every 4 (four) hours as needed for wheezing. What changed:  Another medication with the same name was changed. Make sure you understand how and when to take each.   Ventolin HFA 108 (90 Base) MCG/ACT inhaler Generic drug:  albuterol Inhale 2 puffs into the lungs every 4 (four) hours as needed for wheezing or shortness of breath. What changed:  See the new instructions.   alendronate 70 MG tablet Commonly known as:  FOSAMAX Take 1 tablet (70 mg total) by mouth once a week. Take with a full glass of water on an empty stomach.   ALPRAZolam 0.5 MG tablet Commonly known as:  XANAX Take 0.5-1 tablets (0.25-0.5 mg total) by mouth at bedtime as needed for anxiety.   atorvastatin 40 MG tablet Commonly known as:  LIPITOR Take 1 tablet (40 mg total) by mouth daily at 6 PM.   baclofen 10 MG tablet Commonly known as:  LIORESAL Take 1 tablet (10 mg total) by mouth 3 (three) times daily as needed for muscle spasms.   Belsomra 5 MG Tabs Generic drug:  Suvorexant Take 1 tablet by mouth at bedtime as needed (for insomnia). What changed:  See the new instructions.   benzonatate 100 MG capsule Commonly known as:  Tessalon Perles Take 1 capsule (100 mg total) by mouth 3 (three) times daily as needed for cough.   celecoxib 100 MG capsule Commonly known as:  CELEBREX Take 100 mg by mouth 2 (two) times daily as needed.   clopidogrel 75 MG tablet Commonly known as:  PLAVIX Take 75 mg by mouth daily.   diphenoxylate-atropine 2.5-0.025 MG tablet Commonly known as:  LOMOTIL Take 1 tablet by mouth as needed for diarrhea or loose stools.   DULoxetine 30 MG capsule Commonly known as:  CYMBALTA Take 30 mg by mouth daily.   estradiol 0.5 MG tablet Commonly known as:  ESTRACE Take 1 tablet (0.5 mg total) by mouth every other day.   fentaNYL 50 MCG/HR Commonly known as:  DURAGESIC Place 1 patch (50 mcg total) onto the skin every 3  (three) days.   ferrous sulfate 325 (65 FE) MG tablet Take 1 tablet (325 mg total) by mouth 2 (two) times daily with a meal.   Fluticasone-Salmeterol 250-50 MCG/DOSE Aepb Commonly known as:  Advair Diskus TAKE ONE PUFF TWICE DAILY.   levofloxacin 750 MG tablet Commonly known as:  LEVAQUIN Take 1 tablet (750 mg total) by mouth every other day.   memantine 10 MG tablet Commonly known as:  NAMENDA Take 10 mg by mouth 2 (two) times daily.   mirtazapine 15 MG disintegrating tablet Commonly known as:  REMERON SOL-TAB Take 1 tablet (15 mg total) by mouth at bedtime.   Narcan 4 MG/0.1ML Liqd nasal spray kit Generic drug:  naloxone Place 0.4 mg into the nose daily as needed (for accidental overdose.).   ondansetron 4 MG tablet Commonly known as:  ZOFRAN Take 4 mg by mouth as needed.   pantoprazole 40  MG tablet Commonly known as:  PROTONIX Take 40 mg by mouth 2 (two) times daily. Take 1 tablet (40 mg) by mouth scheduled every morning, may repeat dose in evening if needed for heartburn/indigestion.   potassium chloride 10 MEQ tablet Commonly known as:  K-DUR,KLOR-CON Take 10 mEq by mouth daily.   pregabalin 300 MG capsule Commonly known as:  LYRICA Take 300 mg by mouth 2 (two) times daily.   silver sulfADIAZINE 1 % cream Commonly known as:  SILVADENE Apply 1 application topically daily as needed (leg infection).   tiotropium 18 MCG inhalation capsule Commonly known as:  Spiriva HandiHaler inhale the contents of one capsule in the handihaler once daily   triamcinolone 0.025 % ointment Commonly known as:  KENALOG Apply 1 application topically 2 (two) times daily. What changed:    when to take this  reasons to take this   vitamin B-12 1000 MCG tablet Commonly known as:  CYANOCOBALAMIN Take 1 tablet (1,000 mcg total) by mouth daily.        DISCHARGE INSTRUCTIONS:  See AVS.  If you experience worsening of your admission symptoms, develop shortness of breath, life  threatening emergency, suicidal or homicidal thoughts you must seek medical attention immediately by calling 911 or calling your MD immediately  if symptoms less severe.  You Must read complete instructions/literature along with all the possible adverse reactions/side effects for all the Medicines you take and that have been prescribed to you. Take any new Medicines after you have completely understood and accpet all the possible adverse reactions/side effects.   Please note  You were cared for by a hospitalist during your hospital stay. If you have any questions about your discharge medications or the care you received while you were in the hospital after you are discharged, you can call the unit and asked to speak with the hospitalist on call if the hospitalist that took care of you is not available. Once you are discharged, your primary care physician will handle any further medical issues. Please note that NO REFILLS for any discharge medications will be authorized once you are discharged, as it is imperative that you return to your primary care physician (or establish a relationship with a primary care physician if you do not have one) for your aftercare needs so that they can reassess your need for medications and monitor your lab values.    On the day of Discharge:  VITAL SIGNS:  Blood pressure (!) 141/51, pulse 72, temperature 97.7 F (36.5 C), resp. rate 16, height '4\' 10"'$  (1.473 m), weight 43.5 kg, SpO2 96 %. PHYSICAL EXAMINATION:  GENERAL:  68 y.o.-year-old patient lying in the bed with no acute distress.  EYES: Pupils equal, round, reactive to light and accommodation. No scleral icterus. Extraocular muscles intact.  HEENT: Head atraumatic, normocephalic. Oropharynx and nasopharynx clear.  NECK:  Supple, no jugular venous distention. No thyroid enlargement, no tenderness.  LUNGS: Normal breath sounds bilaterally, no wheezing, rales,rhonchi or crepitation. No use of accessory muscles of  respiration.  CARDIOVASCULAR: S1, S2 normal. No murmurs, rubs, or gallops.  ABDOMEN: Soft, non-tender, non-distended. Bowel sounds present. No organomegaly or mass.  EXTREMITIES: No pedal edema, cyanosis, or clubbing.  NEUROLOGIC: Cranial nerves II through XII are intact. Muscle strength 5/5 in all extremities. Sensation intact. Gait not checked.  PSYCHIATRIC: The patient is alert and oriented x 3.  SKIN: No obvious rash, lesion, or ulcer.  DATA REVIEW:   CBC Recent Labs  Lab 02/03/19 0746  WBC  7.4  HGB 9.1*  HCT 30.0*  PLT 171    Chemistries  Recent Labs  Lab 02/01/19 1422  02/03/19 0746  NA 137  --  138  K 5.1  --  4.9  CL 96*  --  102  CO2 30  --  27  GLUCOSE 92  --  179*  BUN 29*  --  16  CREATININE 1.69*   < > 0.58  CALCIUM 8.0*  --  8.7*  AST 20  --   --   ALT 18  --   --   ALKPHOS 59  --   --   BILITOT 0.9  --   --    < > = values in this interval not displayed.     Microbiology Results  Results for orders placed or performed during the hospital encounter of 02/01/19  Culture, blood (Routine x 2)     Status: None (Preliminary result)   Collection Time: 02/01/19  2:23 PM  Result Value Ref Range Status   Specimen Description BLOOD RIGHT ARM  Final   Special Requests   Final    BOTTLES DRAWN AEROBIC AND ANAEROBIC Blood Culture adequate volume   Culture   Final    NO GROWTH 2 DAYS Performed at [facility], [address] [place], [place] [address]    Report Status PENDING  Incomplete  Culture, blood (Routine x 2)     Status: None (Preliminary result)   Collection Time: 02/01/19  2:23 PM  Result Value Ref Range Status   Specimen Description BLOOD LEFT AC  Final   Special Requests   Final    BOTTLES DRAWN AEROBIC AND ANAEROBIC Blood Culture adequate volume   Culture   Final    NO GROWTH 2 DAYS Performed at [facility], [address] [place], [place] [address]    Report Status PENDING  Incomplete  MRSA PCR Screening     Status:  None   Collection Time: 02/02/19  4:11 AM  Result Value Ref Range Status   MRSA by PCR NEGATIVE NEGATIVE Final    Comment:        The GeneXpert MRSA Assay (FDA approved for NASAL specimens only), is one component of a comprehensive MRSA colonization surveillance program. It is not intended to diagnose MRSA infection nor to guide or monitor treatment for MRSA infections. Performed at [facility], [address] [place], [place] [address]   Expectorated sputum assessment w rflx to resp cult     Status: None   Collection Time: 02/02/19  4:11 AM  Result Value Ref Range Status   Specimen Description SPUTUM  Final   Special Requests Normal  Final   Sputum evaluation   Final    THIS SPECIMEN IS ACCEPTABLE FOR SPUTUM CULTURE Performed at [facility], [address], [place], [place] [address]    Report Status 02/02/2019 FINAL  Final  Culture, respiratory     Status: None (Preliminary result)   Collection Time: 02/02/19  4:11 AM  Result Value Ref Range Status   Specimen Description   Final    SPUTUM Performed at [facility], [address] [place], [place] [address]    Special Requests   Final    Normal Reflexed from [phone] Performed at [facility], [address] [place],  [address]    Gram Stain   Final    RARE WBC PRESENT, PREDOMINANTLY PMN FEW SQUAMOUS EPITHELIAL CELLS PRESENT NO ORGANISMS SEEN  Culture   Final    CULTURE REINCUBATED FOR BETTER GROWTH Performed at [facility], [place] [address], [place], [place] [address]    Report Status PENDING  Incomplete    RADIOLOGY:  No results found.   Management plans discussed with the patient, her husband and sister and they are in agreement.  CODE STATUS: Full Code   TOTAL TIME TAKING CARE OF THIS PATIENT: 33 minutes.    Demetrios Loll M.D on 02/03/2019 at 10:13 AM  Between 7am to 6pm - Pager - [phone]  After 6pm go to [email or web address] - Proofreader   [facility]  Office  [phone]  CC: Primary care physician; Birdie Sons, MD   Note: This dictation was prepared with Dragon dictation along with smaller phrase technology. Any transcriptional errors that result from this process are unintentional.

## 2019-02-03 NOTE — TOC Initial Note (Signed)
Transition of Care [facility]) - Initial/Assessment Note    Patient Details  Name: Cheryl [name] MRN: [id number] Date of Birth: Sep 22, 1951  Transition of Care [facility]) CM/SW Contact:    Virgel Manifold, RN Phone Number: 02/03/2019, 9:42 AM  Clinical Narrative:         Patient to be discharged per MD order. Orders in place for home health services. Patient active with [facility] home health and prefers to discharge resuming care with them. Notified Cory of discharge. Patient has chronic oxygen already set up and has no other DME needs. Family to transport.           Expected Discharge Plan: Home w Home Health Services Barriers to Discharge: Barriers Resolved   Patient Goals and CMS Choice        Expected Discharge Plan and Services Expected Discharge Plan: Home w Home Health Services Discharge Planning Services: CM Consult     Expected Discharge Date: 02/03/19                   HH Arranged: RN, PT HH Agency: (Open with [facility])  Prior Living Arrangements/Services                       Activities of Daily Living Home Assistive Devices/Equipment: Oxygen, Walker (specify type), Nebulizer, Wheelchair(4xwheels walker) ADL Screening (condition at time of admission) Patient's cognitive ability adequate to safely complete daily activities?: Yes Is the patient deaf or have difficulty hearing?: No Does the patient have difficulty seeing, even when wearing glasses/contacts?: No Does the patient have difficulty concentrating, remembering, or making decisions?: No Patient able to express need for assistance with ADLs?: Yes Does the patient have difficulty dressing or bathing?: Yes Independently performs ADLs?: No Communication: Independent Dressing (OT): Needs assistance Is this a change from baseline?: Pre-admission baseline Grooming: Needs assistance Is this a change from baseline?: Pre-admission baseline Feeding: Independent Bathing: Needs assistance Is this a change from baseline?:  Pre-admission baseline Toileting: Needs assistance Is this a change from baseline?: Pre-admission baseline In/Out Bed: Needs assistance Is this a change from baseline?: Pre-admission baseline Walks in Home: Dependent(Per spouse pt uses wheelchair in the last 3xweeks) Is this a change from baseline?: Pre-admission baseline Does the patient have difficulty walking or climbing stairs?: Yes Weakness of Legs: Both Weakness of Arms/Hands: None  Permission Sought/Granted                  Emotional Assessment              Admission diagnosis:  Sepsis (HCC) [A41.9] Hypotension, unspecified hypotension type [I95.9] Pneumonia due to infectious organism, unspecified laterality, unspecified part of lung [J18.9] Sepsis, due to unspecified organism, unspecified whether acute organ dysfunction present [facility]) [A41.9] Patient Active Problem List   Diagnosis Date Noted  . Acute respiratory failure (HCC) 01/15/2019  . Aspiration pneumonia (HCC) 01/01/2019  . Hiatal hernia 12/24/2018  . Sepsis (HCC) 11/03/2018  . Fever   . Respiratory distress   . NSTEMI (non-ST elevated myocardial infarction) (HCC)   . Chronic lower extremity pain (Primary Area of Pain) (Bilateral) (L>R) 09/03/2018  . Chronic hip pain (Fourth Area of Pain) (Bilateral) (L>R) 09/03/2018  . Osteoarthritis of hip (Bilateral) 09/03/2018  . Chronic sacroiliac joint pain [facility] Area of Pain) (Bilateral) (L>R) 09/03/2018  . Sacroiliac joint dysfunction (Bilateral) 09/03/2018  . Spondylosis without myelopathy or radiculopathy, lumbosacral region 09/03/2018  . Other specified dorsopathies, sacral and sacrococcygeal region 09/03/2018  . Disorder of skeletal  system 09/02/2018  . Problems influencing health status 09/02/2018  . DDD (degenerative disc disease), lumbar 09/02/2018  . Abnormal CT scan, lumbar spine (08/04/2017) 09/02/2018  . Levoscoliosis (Severe) 09/02/2018  . 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 Lumbar Grade 1 Anterolisthesis of L3 over L4 and L4 over L5 09/02/2018  . Lumbar foraminal stenosis (Bilateral: L3-4, L4-5) (Severe Right L4-5) 09/02/2018  . Lumbar facet arthropathy (Bilateral) 09/02/2018  . Lumbar facet osteoarthritis (Bilateral) 09/02/2018  . Lumbar spondylosis 09/02/2018  . Cervical spondylosis 09/02/2018  . Thoracic spondylosis 09/02/2018  . DDD (degenerative disc disease), thoracic 09/02/2018  . DDD (degenerative disc disease), cervical 09/02/2018  . Pharmacologic therapy 09/01/2018  . Spondylosis without myelopathy or radiculopathy, lumbar region 09/01/2018  . Anorexia 07/07/2018  . 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 Goals of care, counseling/discussion   . Palliative care by specialist   . Acute encephalopathy 06/18/2018  . Iron deficiency anemia 06/09/2018  . Gastritis without bleeding   . Hypoxia 05/14/2018  . Generalized weakness 02/08/2018  . Marijuana use 06/05/2017  . Pneumonia 04/29/2017  . Metabolic encephalopathy 12/23/2016  . Hypokalemia 12/23/2016  . Protein-calorie malnutrition, severe 11/06/2016  . Nausea with vomiting   . Lumbar facet syndrome (Bilateral) (L>R) 03/20/2016  . Opiate use (160 MME/Day) 02/28/2016  . Anemia 02/28/2016  . Osteoarthrosis 12/18/2015  . Long term current use of anticoagulant therapy (Plavix) 12/18/2015  . Long term current use of opiate analgesic 12/04/2015  . Fibromyalgia 12/04/2015  . Dysphagia 11/24/2015  . Acute diastolic CHF (congestive heart failure) (HCC) 11/24/2015  . Leukocytosis 11/24/2015  . Abnormal mammogram of right breast 10/11/2015  . Dilated intrahepatic bile duct 10/11/2015  . Chronic lumbar radicular pain (Bilateral) (L>R) (L5) 09/04/2015  . Chronic low back pain (Secondary Area of Pain) (Bilateral) (L>R) 09/04/2015  . Encounter for therapeutic drug level monitoring 09/04/2015  . Uncomplicated opioid dependence (HCC) 09/04/2015  . Chronic  pain syndrome 09/04/2015  . Platelet inhibition due to Plavix 09/04/2015  . COPD with acute exacerbation (HCC) 07/31/2015  . Dementia (HCC) 07/31/2015  . Airway hyperreactivity 07/03/2015  . Chronic thoracic back pain 07/03/2015  . Excessive falling 07/03/2015  . Alteration in bowel elimination: incontinence 07/03/2015  . Insomnia 07/03/2015  . Decreased testosterone level 07/03/2015  . Leg weakness 07/03/2015  . Menopausal symptom 07/03/2015  . Migraine 07/03/2015  . Neuropathy (HCC) 07/03/2015  . Fecal occult blood test positive 07/03/2015  . OP (osteoporosis) 07/03/2015  . Panic disorder 07/03/2015  . Shortness of breath 07/03/2015  . Compulsive tobacco user syndrome 07/03/2015  . Urinary incontinence 07/03/2015  . Weight loss 07/03/2015  . Essential hypertension 06/06/2015  . GERD (gastroesophageal reflux disease) 06/06/2015  . Hyperlipemia 06/06/2015  . Peripheral nerve disease 04/13/2014  . Anxiety 02/10/2014  . Coronary artery disease 02/10/2014  . Peripheral vascular disease (HCC) 02/10/2014  . Vitamin D deficiency 10/16/2009   PCP:  Malva Limes, MD Pharmacy:   [facility] [place], [place] - [address] [place] [address] Phone: [phone] Fax: [phone]     Social Determinants of Health (SDOH) Interventions    Readmission Risk Interventions 30 Day Unplanned Readmission Risk Score     ED to Hosp-Admission (Current) from 02/01/2019 in [facility] (1C)  30 Day Unplanned Readmission Risk Score (%)  57 Filed at 02/03/2019 0801     This score is the patient's risk  of an unplanned readmission within 30 days of being discharged (0 -100%). The score is based on dignosis, age, lab data, medications, orders, and past utilization.   Low:  0-14.9   Medium: 15-21.9   High: 22-29.9   Extreme: 30 and above       No flowsheet data found.

## 2019-02-04 ENCOUNTER — Telehealth: Payer: Self-pay

## 2019-02-04 DIAGNOSIS — M797 Fibromyalgia: Secondary | ICD-10-CM | POA: Diagnosis not present

## 2019-02-04 DIAGNOSIS — G8929 Other chronic pain: Secondary | ICD-10-CM | POA: Diagnosis not present

## 2019-02-04 DIAGNOSIS — Z79891 Long term (current) use of opiate analgesic: Secondary | ICD-10-CM | POA: Diagnosis not present

## 2019-02-04 DIAGNOSIS — M5442 Lumbago with sciatica, left side: Secondary | ICD-10-CM | POA: Diagnosis not present

## 2019-02-04 DIAGNOSIS — G894 Chronic pain syndrome: Secondary | ICD-10-CM | POA: Diagnosis not present

## 2019-02-04 LAB — CULTURE, RESPIRATORY: SPECIAL REQUESTS: NORMAL

## 2019-02-04 LAB — CULTURE, RESPIRATORY W GRAM STAIN: Culture: NORMAL

## 2019-02-04 NOTE — Telephone Encounter (Signed)
Transition Care Management Follow-up Telephone Call  Date of discharge and from where: [facility] on 3/11/202  How have you been since you were released from the hospital? Feeling fine, still coughing and seeing yellow sputum. Has lower back pain but that is normal per pt. Pt uses a fentanyl patch for the back pain. Declines fever, SOB or n/v/d. Uses oxygen as needed.  Any questions or concerns? No   Items Reviewed:  Did the pt receive and understand the discharge instructions provided? Yes   Medications obtained and verified? Yes   Any new allergies since your discharge? No   Dietary orders reviewed? Yes  Do you have support at home? Yes   Other (ie: DME, Home Health, etc) Home heath nurse and PT.  Functional Questionnaire: (I = Independent and D = Dependent)  Bathing/Dressing- Needs assistance currently.   Meal Prep- D  Eating- I  Maintaining continence- I  Transferring/Ambulation- Using a wheelchair for assistance currently.  Managing Meds- I   Follow up appointments reviewed:    PCP Hospital f/u appt confirmed? Yes  Scheduled to see Dr. Sherrie Mustache on 02/12/19 @ 10:40 AM.  Specialist Hospital f/u appt confirmed? N/A  Are transportation arrangements needed? No   If their condition worsens, is the pt aware to call  their PCP or go to the ED? Yes  Was the patient provided with contact information for the PCP's office or ED? Yes  Was the pt encouraged to call back with questions or concerns? Yes

## 2019-02-04 NOTE — Telephone Encounter (Signed)
HFU scheduled for 02/12/19 @ 10:40 AM.

## 2019-02-06 LAB — CULTURE, BLOOD (ROUTINE X 2)
Culture: NO GROWTH
Culture: NO GROWTH
Special Requests: ADEQUATE
Special Requests: ADEQUATE

## 2019-02-08 ENCOUNTER — Other Ambulatory Visit: Payer: Self-pay | Admitting: Family Medicine

## 2019-02-08 NOTE — Telephone Encounter (Signed)
[facility] faxed refill request for the following medications:  atorvastatin (LIPITOR) 40 MG tablet   Please advise.

## 2019-02-09 ENCOUNTER — Other Ambulatory Visit: Payer: Self-pay | Admitting: Family Medicine

## 2019-02-09 ENCOUNTER — Other Ambulatory Visit: Payer: Self-pay

## 2019-02-09 ENCOUNTER — Telehealth: Payer: Self-pay | Admitting: Family Medicine

## 2019-02-09 DIAGNOSIS — I2510 Atherosclerotic heart disease of native coronary artery without angina pectoris: Secondary | ICD-10-CM | POA: Diagnosis not present

## 2019-02-09 DIAGNOSIS — J440 Chronic obstructive pulmonary disease with acute lower respiratory infection: Secondary | ICD-10-CM | POA: Diagnosis not present

## 2019-02-09 DIAGNOSIS — J150 Pneumonia due to Klebsiella pneumoniae: Secondary | ICD-10-CM | POA: Diagnosis not present

## 2019-02-09 DIAGNOSIS — I5042 Chronic combined systolic (congestive) and diastolic (congestive) heart failure: Secondary | ICD-10-CM | POA: Diagnosis not present

## 2019-02-09 DIAGNOSIS — R6521 Severe sepsis with septic shock: Secondary | ICD-10-CM | POA: Diagnosis not present

## 2019-02-09 DIAGNOSIS — J9601 Acute respiratory failure with hypoxia: Secondary | ICD-10-CM | POA: Diagnosis not present

## 2019-02-09 DIAGNOSIS — I110 Hypertensive heart disease with heart failure: Secondary | ICD-10-CM | POA: Diagnosis not present

## 2019-02-09 DIAGNOSIS — J441 Chronic obstructive pulmonary disease with (acute) exacerbation: Secondary | ICD-10-CM | POA: Diagnosis not present

## 2019-02-09 DIAGNOSIS — A419 Sepsis, unspecified organism: Secondary | ICD-10-CM | POA: Diagnosis not present

## 2019-02-09 MED ORDER — ATORVASTATIN CALCIUM 40 MG PO TABS: 40.0000 mg | ORAL_TABLET | Freq: Every day | ORAL | 4 refills | Status: DC

## 2019-02-09 MED ORDER — ATORVASTATIN CALCIUM 40 MG PO TABS: 40.0000 mg | ORAL_TABLET | Freq: Every day | ORAL | 4 refills | Status: AC

## 2019-02-09 NOTE — Telephone Encounter (Signed)
That's fine

## 2019-02-09 NOTE — Patient Outreach (Signed)
[facility]) Care Management  02/09/2019  BARB PATRIDGE [birth year] [id number]   EMMI- General Discharge RED ON EMMI ALERT Day # 4 Date: 02/08/2019 Red Alert Reason: unfilled prescriptions? yes  Outreach attempt: spoke with patient.  She is able to verify HIPAA.  Addressed red alert with patient.  She states that she has all her medications and is taking as prescribed.  She has appointments with pain clinic and PCP this week. Patient denies any transportation issues.  Patient states she feels a lot better and is only coughing up small amounts of phlegm.  Discussed with patient signs of worsening and when to notify physician.  She verbalized understanding and denies any needs presently.     Plan: RN CM will close case.    Bary Leriche, RN, MSN [facility] Care Management Care Management Coordinator Direct Line [phone] Toll Free: [phone]  Fax: [phone]

## 2019-02-09 NOTE — Telephone Encounter (Signed)
Lawson Fiscal, RN w/ [facility] [phone]  Needing orders to:  Resume Home Health Nurse - OT - PT - Home Health Aid  Please Advise,  Thanks, [facility]

## 2019-02-09 NOTE — Addendum Note (Signed)
Addended by: Mila Merry E on: 02/09/2019 11:45 AM   Modules accepted: Orders

## 2019-02-10 ENCOUNTER — Ambulatory Visit: Payer: Self-pay | Admitting: Family Medicine

## 2019-02-10 ENCOUNTER — Ambulatory Visit: Payer: Medicare HMO | Attending: Nurse Practitioner | Admitting: Nurse Practitioner

## 2019-02-10 ENCOUNTER — Encounter: Payer: Self-pay | Admitting: Nurse Practitioner

## 2019-02-10 ENCOUNTER — Other Ambulatory Visit: Payer: Self-pay

## 2019-02-10 VITALS — BP 113/60 | HR 52 | Temp 98.4°F | Resp 16 | Ht <= 58 in | Wt 96.0 lb

## 2019-02-10 DIAGNOSIS — J441 Chronic obstructive pulmonary disease with (acute) exacerbation: Secondary | ICD-10-CM | POA: Diagnosis not present

## 2019-02-10 DIAGNOSIS — M47814 Spondylosis without myelopathy or radiculopathy, thoracic region: Secondary | ICD-10-CM | POA: Insufficient documentation

## 2019-02-10 DIAGNOSIS — M546 Pain in thoracic spine: Secondary | ICD-10-CM | POA: Diagnosis not present

## 2019-02-10 DIAGNOSIS — J150 Pneumonia due to Klebsiella pneumoniae: Secondary | ICD-10-CM | POA: Diagnosis not present

## 2019-02-10 DIAGNOSIS — Z79891 Long term (current) use of opiate analgesic: Secondary | ICD-10-CM | POA: Insufficient documentation

## 2019-02-10 DIAGNOSIS — G8929 Other chronic pain: Secondary | ICD-10-CM | POA: Insufficient documentation

## 2019-02-10 DIAGNOSIS — M5416 Radiculopathy, lumbar region: Secondary | ICD-10-CM | POA: Diagnosis not present

## 2019-02-10 DIAGNOSIS — J440 Chronic obstructive pulmonary disease with acute lower respiratory infection: Secondary | ICD-10-CM | POA: Diagnosis not present

## 2019-02-10 DIAGNOSIS — G894 Chronic pain syndrome: Secondary | ICD-10-CM | POA: Diagnosis not present

## 2019-02-10 DIAGNOSIS — R6521 Severe sepsis with septic shock: Secondary | ICD-10-CM | POA: Diagnosis not present

## 2019-02-10 DIAGNOSIS — G629 Polyneuropathy, unspecified: Secondary | ICD-10-CM | POA: Diagnosis not present

## 2019-02-10 DIAGNOSIS — A419 Sepsis, unspecified organism: Secondary | ICD-10-CM | POA: Diagnosis not present

## 2019-02-10 DIAGNOSIS — M47816 Spondylosis without myelopathy or radiculopathy, lumbar region: Secondary | ICD-10-CM | POA: Diagnosis not present

## 2019-02-10 DIAGNOSIS — I2510 Atherosclerotic heart disease of native coronary artery without angina pectoris: Secondary | ICD-10-CM | POA: Diagnosis not present

## 2019-02-10 DIAGNOSIS — I5042 Chronic combined systolic (congestive) and diastolic (congestive) heart failure: Secondary | ICD-10-CM | POA: Diagnosis not present

## 2019-02-10 DIAGNOSIS — J9601 Acute respiratory failure with hypoxia: Secondary | ICD-10-CM | POA: Diagnosis not present

## 2019-02-10 DIAGNOSIS — I110 Hypertensive heart disease with heart failure: Secondary | ICD-10-CM | POA: Diagnosis not present

## 2019-02-10 MED ORDER — FENTANYL 50 MCG/HR TD PT72: 1.0000 | MEDICATED_PATCH | TRANSDERMAL | 0 refills | Status: AC

## 2019-02-10 NOTE — Progress Notes (Signed)
Nursing Pain Medication Assessment:  Safety precautions to be maintained throughout the outpatient stay will include: orient to surroundings, keep bed in low position, maintain call bell within reach at all times, provide assistance with transfer out of bed and ambulation.  Medication Inspection Compliance: Cheryl [name] did not comply with our request to bring her pills to be counted. She was reminded that bringing the medication bottles, even when empty, is a requirement.  Medication: None brought in. Pill/Patch Count: None available to be counted. Bottle Appearance: No container available. Did not bring bottle(s) to appointment. Filled Date: N/A Last Medication intake:  Day before yesterday   Patient was hospitalized and was d/c'd on last Wednesday and reports that she was given Tramadol.

## 2019-02-10 NOTE — Progress Notes (Signed)
Patient's Name: Cheryl [name]  MRN: [id number]  Referring Provider: Birdie Sons, MD  DOB: 08-06-1951  PCP: Birdie Sons, MD  DOS: 02/10/2019  Note by: Dionisio David, NP  Service setting: Ambulatory outpatient  Specialty: Interventional Pain Management  Location: [facility] (AMB) Pain Management Facility    Patient type: Established   HPI  Reason for Visit: [name] is a 68 y.o. year old, female patient, who comes today with a chief complaint of Back Pain (lumbar) and Generalized Body Aches (fibromyalgia ) Last Appointment: Her last appointment at our practice was on 10/28/2018. I last saw her on 10/28/2018.  Pain Assessment: Today, [name] describes the severity of the Chronic pain as a 3 /10. She indicates the location/referral of the pain to be (arm right side ) Lower, Left, Right/occassionally into hips and legs . Onset was: More than a month ago. The quality of pain is described as Discomfort, Constant, Aching. Temporal description, or timing of pain is: Constant. Possible modifying factors: medications, rest . [name]'s  height is '4\' 10"'$  (1.473 m) and weight is 96 lb (43.5 kg). Her oral temperature is 98.4 F (36.9 C). Her blood pressure is 113/60 and her pulse is 52 (abnormal). Her respiration is 16 and oxygen saturation is 93%. She denies any changes with her pain.  She has been in the hospital on 5 different occasions in 2020 for aspiration pneumonia.  Controlled Substance Pharmacotherapy Assessment REMS (Risk Evaluation and Mitigation Strategy)  Analgesic:Duragesic 50 mcg/h every 72 hours  MME/day:120 mg/day.  [name], RN  02/10/2019  9:14 AM  Sign when Signing Visit Nursing Pain Medication Assessment:  Safety precautions to be maintained throughout the outpatient stay will include: orient to surroundings, keep bed in low position, maintain call bell within reach at all times, provide assistance with transfer out of bed and ambulation.  Medication Inspection  Compliance: [name] did not comply with our request to bring her pills to be counted. She was reminded that bringing the medication bottles, even when empty, is a requirement.  Medication: None brought in. Pill/Patch Count: None available to be counted. Bottle Appearance: No container available. Did not bring bottle(s) to appointment. Filled Date: N/A Last Medication intake:  Day before yesterday   Patient was hospitalized and was d/c'd on last Wednesday and reports that she was given Tramadol.    Pharmacokinetics: Liberation and absorption (onset of action): WNL Distribution (time to peak effect): WNL Metabolism and excretion (duration of action): WNL         Pharmacodynamics: Desired effects: Analgesia: [name] reports >50% benefit. Functional ability: Patient reports that medication allows her to accomplish basic ADLs Clinically meaningful improvement in function (CMIF): Sustained CMIF goals met Perceived effectiveness: Described as relatively effective, allowing for increase in activities of daily living (ADL) Undesirable effects: Side-effects or Adverse reactions: None reported Monitoring: [place] PMP: Online review of the past 75-monthperiod conducted. Compliant with practice rules and regulations Last UDS on record: Summary  Date Value Ref Range Status  09/02/2018 FINAL  Final    Comment:    ==================================================================== TOXASSURE SELECT 13 (MW) ==================================================================== Test                             Result       Flag       Units Drug Present and Declared for Prescription Verification   Alprazolam  72           EXPECTED   ng/mg creat   Alpha-hydroxyalprazolam        322          EXPECTED   ng/mg creat    Source of alprazolam is a scheduled prescription medication.    Alpha-hydroxyalprazolam is an expected metabolite of alprazolam.   Fentanyl                       38            EXPECTED   ng/mg creat   Norfentanyl                    137          EXPECTED   ng/mg creat    Source of fentanyl is a scheduled prescription medication,    including IV, patch, and transmucosal formulations. Norfentanyl    is an expected metabolite of fentanyl. Drug Present not Declared for Prescription Verification   Oxazepam                       31           UNEXPECTED ng/mg creat    Oxazepam may be administered as a scheduled prescription    medication; it is also an expected metabolite of other    benzodiazepine drugs, including diazepam, chlordiazepoxide,    prazepam, clorazepate, halazepam, and temazepam.   Tramadol                       50           UNEXPECTED ng/mg creat   O-Desmethyltramadol            58           UNEXPECTED ng/mg creat    Source of tramadol is a prescription medication.    O-desmethyltramadol is an expected metabolite of tramadol. ==================================================================== Test                      Result    Flag   Units      Ref Range   Creatinine              206              mg/dL      >=20 ==================================================================== Declared Medications:  The flagging and interpretation on this report are based on the  following declared medications.  Unexpected results may arise from  inaccuracies in the declared medications.  **Note: The testing scope of this panel includes these medications:  Alprazolam  Fentanyl  **Note: The testing scope of this panel does not include following  reported medications:  Acetaminophen (Tylenol)  Albuterol  Alendronate  Atropine (Lomotil)  Baclofen  Celecoxib  Clopidogrel  Cyanocobalamin  Diphenoxylate (Lomotil)  Donepezil (Aricept)  Estradiol (Estrace)  Fluticasone (Advair)  Iron (Ferrous Sulfate)  Naloxone (Narcan)  Pantoprazole  Pregabalin (Lyrica)  Salmeterol (Advair)  Simvastatin (Zocor)  Suvorexant  Tiotropium (Spiriva)  Topical  (Silvadene)  Triamcinolone (Kenalog) ==================================================================== For clinical consultation, please call [phone]. ====================================================================    UDS interpretation: Compliant          Medication Assessment Form: Reviewed. Patient indicates being compliant with therapy Treatment compliance: Compliant Risk Assessment Profile: Aberrant behavior: See initial evaluations. None observed or detected today Comorbid factors increasing risk of overdose: See initial evaluation. No additional  risks detected today Opioid risk tool (ORT):  Opioid Risk  02/10/2019  Alcohol 1  Illegal Drugs 2  Rx Drugs 4  Alcohol 0  Illegal Drugs 0  Rx Drugs 0  Age between 16-45 years  0  History of Preadolescent Sexual Abuse -  Psychological Disease 0  Depression 0  Opioid Risk Tool Scoring 7  Opioid Risk Interpretation Moderate Risk    ORT Scoring interpretation table:  Score <3 = Low Risk for SUD  Score between 4-7 = Moderate Risk for SUD  Score >8 = High Risk for Opioid Abuse   Risk of substance use disorder (SUD): Low  Risk Mitigation Strategies:  Patient Counseling: Covered Patient-Prescriber Agreement (PPA): Present and active  Notification to other healthcare providers: Done  Pharmacologic Plan: No change in therapy, at this time.             ROS  Constitutional: Denies any fever or chills Gastrointestinal: No reported hemesis, hematochezia, vomiting, or acute GI distress Musculoskeletal: Denies any acute onset joint swelling, redness, loss of ROM, or weakness Neurological: No reported episodes of acute onset apraxia, aphasia, dysarthria, agnosia, amnesia, paralysis, loss of coordination, or loss of consciousness  Medication Review  ALPRAZolam, DULoxetine, Fluticasone-Salmeterol, Suvorexant, acetaminophen, albuterol, alendronate, atorvastatin, baclofen, benzonatate, celecoxib, clopidogrel,  diphenoxylate-atropine, estradiol, fentaNYL, ferrous sulfate, levofloxacin, memantine, mirtazapine, naloxone, ondansetron, pantoprazole, potassium chloride, pregabalin, silver sulfADIAZINE, tiotropium, triamcinolone, and vitamin B-12  History Review  Allergy: Ms. Lookingbill is allergic to percocet [oxycodone-acetaminophen]; aspirin; codeine; propoxyphene; and sulfa antibiotics. Drug: Ms. Dirks  reports no history of drug use. Alcohol:  reports no history of alcohol use. Tobacco:  reports that she has been smoking cigarettes. She has a 25.00 pack-year smoking history. She has never used smokeless tobacco. Social: Ms. Mathison  reports that she has been smoking cigarettes. She has a 25.00 pack-year smoking history. She has never used smokeless tobacco. She reports that she does not drink alcohol or use drugs. Medical:  has a past medical history of Acute postoperative pain (04/14/2017), Allergy, Anemia, Anxiety, Arthritis, Aspiration pneumonitis (HCC) (11/24/2015), Asthma, CHF (congestive heart failure) ([place]), Chronic pain, COPD (chronic obstructive pulmonary disease) ([place]), Coronary artery disease, DVT (deep venous thrombosis) ([place]), Dyspnea, Dysrhythmia, Encephalopathy, GERD (gastroesophageal reflux disease), Headache, Hyperlipidemia, Hypertension, Kyphoscoliosis deformity of spine, Migraines, Neuropathy (2010), Osteoporosis, Oxygen deficiency, Peripheral vascular disease ([place]), Pneumonia, Pneumonia (11/19/2015), Pneumonia (10/2016), Pneumonia, Rib fracture (09/19/2017), Vitamin D deficiency, and Wears dentures. Surgical: Ms. Trunnell  has a past surgical history that includes Appendectomy; Spine surgery; Foot surgery (Bilateral); cardiac catherization (10/31/2009); abdomnal aortic stent (05/30/2008); Abdominal hysterectomy (1975); Cholecystectomy (1972); Cervical fusion (C5 - 6/C6-7); Appendectomy; Colonoscopy with propofol (N/A, 07/27/2015); Esophagogastroduodenoscopy (egd) with propofol (N/A, 07/27/2015);  Esophagogastroduodenoscopy (egd) with propofol (N/A, 05/19/2018); Tonsillectomy; Back surgery; Cardiac catheterization; Vascular surgery; Cataract extraction w/PHACO (Left, 08/20/2018); Eye surgery; and Cataract extraction w/PHACO (Right, 09/23/2018). Family: family history includes Arthritis in her mother; Cancer in her brother, brother, and mother; Diabetes in her brother, mother, and sister; Heart disease in her father and mother. Problem List: Ms. Sellin has Chronic thoracic back pain; Neuropathy ([place]); Chronic lumbar radicular pain (Bilateral) (L>R) (L5); Chronic low back pain (Secondary Area of Pain) (Bilateral) (L>R); Chronic pain syndrome; Fibromyalgia; Osteoarthrosis; and Lumbar facet syndrome (Bilateral) (L>R) on their pertinent problem list.  Lab Review  Kidney Function Lab Results  Component Value Date   BUN 16 02/03/2019   CREATININE 0.58 02/03/2019   BCR 18 12/01/2018   GFRAA >60 02/03/2019   GFRNONAA >60 02/03/2019  Liver  Function Lab Results  Component Value Date   AST 20 02/01/2019   ALT 18 02/01/2019   ALBUMIN 3.6 02/01/2019  Note: Above Lab results reviewed.  Imaging Review  DG Chest Port 1 View CLINICAL DATA:  Recent hospitalization for pneumonia.  EXAM: PORTABLE CHEST 1 VIEW  COMPARISON:  January 18, 2019  FINDINGS: No pneumothorax. The cardiomediastinal silhouette is stable. Vascular crowding in the medial right lung base. Left retrocardiac opacity may represent a hiatal hernia. No other acute abnormalities.  IMPRESSION: 1. Left retrocardiac opacity may represent a hiatal hernia versus infiltrate. Recommend a PA and lateral chest x-ray for better evaluation.  Electronically Signed   By: Dorise Bullion III M.D   On: 02/01/2019 15:17 Note: Reviewed        Physical Exam  General appearance: Well nourished, well developed, and well hydrated. In no apparent acute distress Mental status: Alert, oriented x 3 (person, place, & time)       Respiratory: No  evidence of acute respiratory distress Eyes: PERLA Vitals: BP 113/60 (BP Location: Right Arm, Patient Position: Sitting, Cuff Size: Small)   Pulse (!) 52   Temp 98.4 F (36.9 C) (Oral)   Resp 16   Ht '4\' 10"'$  (1.473 m)   Wt 96 lb (43.5 kg)   SpO2 93%   BMI 20.06 kg/m  BMI: Estimated body mass index is 20.06 kg/m as calculated from the following:   Height as of this encounter: '4\' 10"'$  (1.473 m).   Weight as of this encounter: 96 lb (43.5 kg). Ideal: Female patients must weigh at least 45.5 kg to calculate ideal body weight Thoracic Spine Area Exam  Skin & Axial Inspection: prominent thoracic Kyphosis Alignment: Symmetrical Functional ROM: Unrestricted ROM Stability: No instability detected Muscle Tone/Strength: Functionally intact. No obvious neuro-muscular anomalies detected. Sensory (Neurological): Unimpaired Muscle strength & Tone: No palpable anomalies Lumbar Spine Area Exam  Skin & Axial Inspection: No masses, redness, or swelling Alignment: Symmetrical Functional ROM: Unrestricted ROM       Stability: No instability detected Muscle Tone/Strength: Functionally intact. No obvious neuro-muscular anomalies detected. Sensory (Neurological): Unimpaired Palpation: No palpable anomalies       Provocative Tests: Hyperextension/rotation test: deferred today       Lumbar quadrant test (Kemp's test): deferred today       Lateral bending test: deferred today       Patrick's Maneuver: deferred today                    Gait & Posture Assessment  Ambulation: Patient ambulates using a wheel chair Gait: Relatively normal for age and body habitus Posture: Thoracic kyphosis   Assessment   Status Diagnosis  Controlled Controlled Controlled 1. Lumbar spondylosis   2. Thoracic spondylosis   3. Chronic thoracic back pain, unspecified back pain laterality   4. Chronic lumbar radicular pain (Bilateral) (L>R) (L5)   5. Neuropathy ([place])   6. Long term current use of opiate analgesic   7.  Chronic pain syndrome   8. Long term prescription opiate use      Updated Problems: No problems updated.  Plan of Care  Pharmacotherapy (Medications Ordered): Meds ordered this encounter  Medications  . fentaNYL (DURAGESIC) 50 MCG/HR    Sig: Place 1 patch onto the skin every 3 (three) days for 30 days.    Dispense:  10 patch    Refill:  0    Do not place this medication, or any other prescription from our practice, on "  Automatic Refill". Patient may have prescription filled one day early if pharmacy is closed on scheduled refill date.    Order Specific Question:   Supervising Provider    Answer:   Milinda Pointer [phone]  . fentaNYL (DURAGESIC) 50 MCG/HR    Sig: Place 1 patch onto the skin every 3 (three) days for 30 days.    Dispense:  10 patch    Refill:  0    Do not place this medication, or any other prescription from our practice, on "Automatic Refill". Patient may have prescription filled one day early if pharmacy is closed on scheduled refill date.    Order Specific Question:   Supervising Provider    Answer:   Milinda Pointer [phone]  . fentaNYL (DURAGESIC) 50 MCG/HR    Sig: Place 1 patch onto the skin every 3 (three) days for 30 days.    Dispense:  10 patch    Refill:  0    Do not place this medication, or any other prescription from our practice, on "Automatic Refill". Patient may have prescription filled one day early if pharmacy is closed on scheduled refill date.    Order Specific Question:   Supervising Provider    Answer:   Milinda Pointer [[id number]]   Administered today: Lesle Reek had no medications administered during this visit.  Orders:  Orders Placed This Encounter  Procedures  . ToxASSURE Select 13 (MW), Urine    Volume: 30 ml(s). Minimum 3 ml of urine is needed. Document temperature of fresh sample. Indications: Long term (current) use of opiate analgesic (O14.103)   Follow-up plan:   Return in about 3 months (around 05/13/2019) for  MedMgmt. Not at this time   Interventional options: Planned, scheduled, and/or pending: Palliative leftL4-5 LESIunder fluoroscopic guidance and IV sedation.   Considering: Bilateral lumbar facet radiofrequency ablation(Last done: R: 08/12/17;L: 04/14/17)   Palliative PRN treatment(s): Palliative bilateral lumbar facet block Bilateral lumbar facet radiofrequency ablation    Note by: Dionisio David, NP Date: 02/10/2019; Time: 10:26 AM

## 2019-02-10 NOTE — Telephone Encounter (Signed)
Pt advised.   Thanks,   -Kaila Devries  

## 2019-02-10 NOTE — Patient Instructions (Signed)
____________________________________________________________________________________________  Medication Rules  Purpose: To inform patients, and their family members, of our rules and regulations.  Applies to: All patients receiving prescriptions (written or electronic).  Pharmacy of record: Pharmacy where electronic prescriptions will be sent. If written prescriptions are taken to a different pharmacy, please inform the nursing staff. The pharmacy listed in the electronic medical record should be the one where you would like electronic prescriptions to be sent.  Electronic prescriptions: In compliance with the [place] Strengthen Opioid Misuse Prevention (STOP) Act of 2017 (Session Law 2017-74/H243), effective November 25, 2018, all controlled substances must be electronically prescribed. Calling prescriptions to the pharmacy will cease to exist.  Prescription refills: Only during scheduled appointments. Applies to all prescriptions.  NOTE: The following applies primarily to controlled substances (Opioid* Pain Medications).   Patient's responsibilities: 1. Pain Pills: Bring all pain pills to every appointment (except for procedure appointments). 2. Pill Bottles: Bring pills in original pharmacy bottle. Always bring the newest bottle. Bring bottle, even if empty. 3. Medication refills: You are responsible for knowing and keeping track of what medications you take and those you need refilled. The day before your appointment: write a list of all prescriptions that need to be refilled. The day of the appointment: give the list to the admitting nurse. Prescriptions will be written only during appointments. No prescriptions will be written on procedure days. If you forget a medication: it will not be "Called in", "Faxed", or "electronically sent". You will need to get another appointment to get these prescribed. No early refills. Do not call asking to have your prescription filled  early. 4. Prescription Accuracy: You are responsible for carefully inspecting your prescriptions before leaving our office. Have the discharge nurse carefully go over each prescription with you, before taking them home. Make sure that your name is accurately spelled, that your address is correct. Check the name and dose of your medication to make sure it is accurate. Check the number of pills, and the written instructions to make sure they are clear and accurate. Make sure that you are given enough medication to last until your next medication refill appointment. 5. Taking Medication: Take medication as prescribed. When it comes to controlled substances, taking less pills or less frequently than prescribed is permitted and encouraged. Never take more pills than instructed. Never take medication more frequently than prescribed.  6. Inform other Doctors: Always inform, all of your healthcare providers, of all the medications you take. 7. Pain Medication from other Providers: You are not allowed to accept any additional pain medication from any other Doctor or Healthcare provider. There are two exceptions to this rule. (see below) In the event that you require additional pain medication, you are responsible for notifying us, as stated below. 8. Medication Agreement: You are responsible for carefully reading and following our Medication Agreement. This must be signed before receiving any prescriptions from our practice. Safely store a copy of your signed Agreement. Violations to the Agreement will result in no further prescriptions. (Additional copies of our Medication Agreement are available upon request.) 9. Laws, Rules, & Regulations: All patients are expected to follow all Federal and State Laws, Statutes, Rules, & Regulations. Ignorance of the Laws does not constitute a valid excuse. The use of any illegal substances is prohibited. 10. Adopted CDC guidelines & recommendations: Target dosing levels will be  at or below 60 MME/day. Use of benzodiazepines** is not recommended.  Exceptions: There are only two exceptions to the rule of not   receiving pain medications from other Healthcare Providers. 1. Exception #1 (Emergencies): In the event of an emergency (i.e.: accident requiring emergency care), you are allowed to receive additional pain medication. However, you are responsible for: As soon as you are able, call our office [phone], at any time of the day or night, and leave a message stating your name, the date and nature of the emergency, and the name and dose of the medication prescribed. In the event that your call is answered by a member of our staff, make sure to document and save the date, time, and the name of the person that took your information.  2. Exception #2 (Planned Surgery): In the event that you are scheduled by another doctor or dentist to have any type of surgery or procedure, you are allowed (for a period no longer than 30 days), to receive additional pain medication, for the acute post-op pain. However, in this case, you are responsible for picking up a copy of our "Post-op Pain Management for Surgeons" handout, and giving it to your surgeon or dentist. This document is available at our office, and does not require an appointment to obtain it. Simply go to our office during business hours (Monday-Thursday from 8:00 AM to 4:00 PM) (Friday 8:00 AM to 12:00 Noon) or if you have a scheduled appointment with us, prior to your surgery, and ask for it by name. In addition, you will need to provide us with your name, name of your surgeon, type of surgery, and date of procedure or surgery.  *Opioid medications include: morphine, codeine, oxycodone, oxymorphone, hydrocodone, hydromorphone, meperidine, tramadol, tapentadol, buprenorphine, fentanyl, methadone. **Benzodiazepine medications include: diazepam (Valium), alprazolam (Xanax), clonazepam (Klonopine), lorazepam (Ativan), clorazepate  (Tranxene), chlordiazepoxide (Librium), estazolam (Prosom), oxazepam (Serax), temazepam (Restoril), triazolam (Halcion) (Last updated: 01/22/2018) ____________________________________________________________________________________________    

## 2019-02-11 DIAGNOSIS — I5042 Chronic combined systolic (congestive) and diastolic (congestive) heart failure: Secondary | ICD-10-CM | POA: Diagnosis not present

## 2019-02-11 DIAGNOSIS — J440 Chronic obstructive pulmonary disease with acute lower respiratory infection: Secondary | ICD-10-CM | POA: Diagnosis not present

## 2019-02-11 DIAGNOSIS — J441 Chronic obstructive pulmonary disease with (acute) exacerbation: Secondary | ICD-10-CM | POA: Diagnosis not present

## 2019-02-11 DIAGNOSIS — I110 Hypertensive heart disease with heart failure: Secondary | ICD-10-CM | POA: Diagnosis not present

## 2019-02-11 DIAGNOSIS — A419 Sepsis, unspecified organism: Secondary | ICD-10-CM | POA: Diagnosis not present

## 2019-02-11 DIAGNOSIS — J150 Pneumonia due to Klebsiella pneumoniae: Secondary | ICD-10-CM | POA: Diagnosis not present

## 2019-02-11 DIAGNOSIS — I2510 Atherosclerotic heart disease of native coronary artery without angina pectoris: Secondary | ICD-10-CM | POA: Diagnosis not present

## 2019-02-11 DIAGNOSIS — J9601 Acute respiratory failure with hypoxia: Secondary | ICD-10-CM | POA: Diagnosis not present

## 2019-02-11 DIAGNOSIS — R6521 Severe sepsis with septic shock: Secondary | ICD-10-CM | POA: Diagnosis not present

## 2019-02-12 ENCOUNTER — Telehealth: Payer: Self-pay | Admitting: Family Medicine

## 2019-02-12 ENCOUNTER — Other Ambulatory Visit: Payer: Self-pay

## 2019-02-12 ENCOUNTER — Encounter: Payer: Self-pay | Admitting: Family Medicine

## 2019-02-12 ENCOUNTER — Ambulatory Visit (INDEPENDENT_AMBULATORY_CARE_PROVIDER_SITE_OTHER): Payer: Medicare HMO | Admitting: Family Medicine

## 2019-02-12 VITALS — BP 100/52 | HR 99 | Temp 98.7°F | Ht <= 58 in | Wt 100.0 lb

## 2019-02-12 DIAGNOSIS — Z720 Tobacco use: Secondary | ICD-10-CM

## 2019-02-12 DIAGNOSIS — I2510 Atherosclerotic heart disease of native coronary artery without angina pectoris: Secondary | ICD-10-CM | POA: Diagnosis not present

## 2019-02-12 DIAGNOSIS — J440 Chronic obstructive pulmonary disease with acute lower respiratory infection: Secondary | ICD-10-CM | POA: Diagnosis not present

## 2019-02-12 DIAGNOSIS — J439 Emphysema, unspecified: Secondary | ICD-10-CM

## 2019-02-12 DIAGNOSIS — R110 Nausea: Secondary | ICD-10-CM | POA: Diagnosis not present

## 2019-02-12 DIAGNOSIS — R6521 Severe sepsis with septic shock: Secondary | ICD-10-CM | POA: Diagnosis not present

## 2019-02-12 DIAGNOSIS — I110 Hypertensive heart disease with heart failure: Secondary | ICD-10-CM | POA: Diagnosis not present

## 2019-02-12 DIAGNOSIS — A419 Sepsis, unspecified organism: Secondary | ICD-10-CM | POA: Diagnosis not present

## 2019-02-12 DIAGNOSIS — J441 Chronic obstructive pulmonary disease with (acute) exacerbation: Secondary | ICD-10-CM | POA: Diagnosis not present

## 2019-02-12 DIAGNOSIS — J9601 Acute respiratory failure with hypoxia: Secondary | ICD-10-CM | POA: Diagnosis not present

## 2019-02-12 DIAGNOSIS — I5042 Chronic combined systolic (congestive) and diastolic (congestive) heart failure: Secondary | ICD-10-CM | POA: Diagnosis not present

## 2019-02-12 DIAGNOSIS — J449 Chronic obstructive pulmonary disease, unspecified: Secondary | ICD-10-CM | POA: Diagnosis not present

## 2019-02-12 DIAGNOSIS — J189 Pneumonia, unspecified organism: Secondary | ICD-10-CM | POA: Diagnosis not present

## 2019-02-12 DIAGNOSIS — J150 Pneumonia due to Klebsiella pneumoniae: Secondary | ICD-10-CM | POA: Diagnosis not present

## 2019-02-12 DIAGNOSIS — I5189 Other ill-defined heart diseases: Secondary | ICD-10-CM | POA: Insufficient documentation

## 2019-02-12 MED ORDER — ONDANSETRON HCL 4 MG PO TABS: 4.0000 mg | ORAL_TABLET | ORAL | 3 refills | Status: AC | PRN

## 2019-02-12 MED ORDER — BUPROPION HCL ER (SR) 150 MG PO TB12: ORAL_TABLET | ORAL | 3 refills | Status: AC

## 2019-02-12 MED ORDER — LEVOFLOXACIN 750 MG PO TABS: 750.0000 mg | ORAL_TABLET | Freq: Every day | ORAL | 0 refills | Status: AC

## 2019-02-12 NOTE — Telephone Encounter (Signed)
Pt advised.   Thanks,   -Laura  

## 2019-02-12 NOTE — Telephone Encounter (Signed)
That's fine

## 2019-02-12 NOTE — Telephone Encounter (Signed)
Clayton Lefort with [facility] called asking for a verbal for PT 1 time a week for one week  2 times for two weeks , and 1 time for week 2 weeks  Pt was just discharged from hospital for pneumonia  CB#  [phone]  Thanks Barth Kirks

## 2019-02-12 NOTE — Patient Instructions (Addendum)
.   Please review the attached list of medications and notify my office if there are any errors.   . Please bring all of your medications to every appointment so we can make sure that our medication list is the same as yours.    Be sure to call you pulmonologist to schedule a follow up visit since you've been in the hospital   Start taking Zyban (bupropion) to help stop smoking. Take the medication for two weeks before you try to quit

## 2019-02-12 NOTE — Progress Notes (Signed)
Patient: Cheryl [name] Female    DOB: May 12, 1951   68 y.o.   MRN: [id number] Visit Date: 02/12/2019  Today's Provider: Lelon Huh, MD   Chief Complaint  Patient presents with  . Hospitalization Follow-up    02/01/19 to 02/03/19 for sepsis   Subjective:     HPI   Follow up Hospitalization  Patient was admitted to [facility] on 02/01/19 and discharged on 02/03/19. She was treated for Sepsis. Treatment for this included IV vancomycin and levofloxacin and later changed to oral levofloxacin. She was discharged with 3 additional days of oral levofloxacin which she lost and did not feel.  Telephone follow up was done on 02/09/2019 She reports good compliance with treatment. She reports this condition is Improved while on medications, but over the last few days is starting to get more chest congestion again. She does have large and portable oxygen tanks at home, which she states she usually uses, but did not bring with her today.   She states she is followed by a pulmonologist at [facility], although I don't see any records from [facility] pulmonary. She did see Dr. Pollyann Savoy pulmonary August 2019. She states she does have contact number to schedule follow up  Pulmonary.   ------------------------------------------------------------------------------------     Allergies  Allergen Reactions  . Percocet [Oxycodone-Acetaminophen] Hives and Rash  . Aspirin Nausea And Vomiting and Other (See Comments)  . Codeine Nausea And Vomiting, Nausea Only and Other (See Comments)  . Propoxyphene Nausea Only and Other (See Comments)  . Sulfa Antibiotics Rash and Other (See Comments)     Current Outpatient Medications:  .  acetaminophen (TYLENOL) 325 MG tablet, Take 2 tablets (650 mg total) by mouth every 6 (six) hours as needed for mild pain (or Fever >/= 101)., Disp: , Rfl:  .  albuterol (PROVENTIL) (2.5 MG/3ML) 0.083% nebulizer solution, Take 3 mLs (2.5 mg total) by nebulization every 4 (four) hours as  needed for wheezing., Disp: 75 mL, Rfl: 11 .  alendronate (FOSAMAX) 70 MG tablet, Take 1 tablet (70 mg total) by mouth once a week. Take with a full glass of water on an empty stomach., Disp: 4 tablet, Rfl: 12 .  ALPRAZolam (XANAX) 0.5 MG tablet, Take 0.5-1 tablets (0.25-0.5 mg total) by mouth at bedtime as needed for anxiety., Disp: 30 tablet, Rfl: 4 .  atorvastatin (LIPITOR) 40 MG tablet, Take 1 tablet (40 mg total) by mouth daily at 6 PM., Disp: 90 tablet, Rfl: 4 .  baclofen (LIORESAL) 10 MG tablet, Take 1 tablet (10 mg total) by mouth 3 (three) times daily as needed for muscle spasms., Disp: 30 tablet, Rfl: 0 .  BELSOMRA 5 MG TABS, Take 1 tablet by mouth at bedtime as needed (for insomnia). (Patient taking differently: Take 5 mg by mouth at bedtime. ), Disp: 30 tablet, Rfl: 5 .  benzonatate (TESSALON PERLES) 100 MG capsule, Take 1 capsule (100 mg total) by mouth 3 (three) times daily as needed for cough., Disp: 20 capsule, Rfl: 1 .  celecoxib (CELEBREX) 100 MG capsule, Take 100 mg by mouth 2 (two) times daily as needed., Disp: , Rfl:  .  clopidogrel (PLAVIX) 75 MG tablet, Take 75 mg by mouth daily., Disp: , Rfl:  .  diphenoxylate-atropine (LOMOTIL) 2.5-0.025 MG tablet, Take 1 tablet by mouth as needed for diarrhea or loose stools., Disp: , Rfl:  .  DULoxetine (CYMBALTA) 30 MG capsule, Take 30 mg by mouth daily., Disp: , Rfl:  .  estradiol (ESTRACE) 0.5 MG tablet, Take 1 tablet (0.5 mg total) by mouth every other day., Disp: 45 tablet, Rfl: 2 .  fentaNYL (DURAGESIC) 50 MCG/HR, Place 1 patch onto the skin every 3 (three) days for 30 days., Disp: 10 patch, Rfl: 0 .  memantine (NAMENDA) 10 MG tablet, Take 10 mg by mouth 2 (two) times daily. , Disp: , Rfl:  .  mirtazapine (REMERON SOL-TAB) 15 MG disintegrating tablet, Take 1 tablet (15 mg total) by mouth at bedtime., Disp: 30 tablet, Rfl: 5 .  NARCAN 4 MG/0.1ML LIQD nasal spray kit, Place 0.4 mg into the nose daily as needed (for accidental overdose.).  , Disp: , Rfl:  .  ondansetron (ZOFRAN) 4 MG tablet, Take 4 mg by mouth as needed., Disp: , Rfl:  .  pantoprazole (PROTONIX) 40 MG tablet, Take 40 mg by mouth 2 (two) times daily. Take 1 tablet (40 mg) by mouth scheduled every morning, may repeat dose in evening if needed for heartburn/indigestion., Disp: , Rfl:  .  potassium chloride (K-DUR,KLOR-CON) 10 MEQ tablet, Take 10 mEq by mouth daily., Disp: , Rfl:  .  pregabalin (LYRICA) 300 MG capsule, Take 300 mg by mouth 2 (two) times daily., Disp: , Rfl:  .  silver sulfADIAZINE (SILVADENE) 1 % cream, Apply 1 application topically daily as needed (leg infection). , Disp: , Rfl:  .  tiotropium (SPIRIVA HANDIHALER) 18 MCG inhalation capsule, inhale the contents of one capsule in the handihaler once daily, Disp: 30 capsule, Rfl: 12 .  triamcinolone (KENALOG) 0.025 % ointment, Apply 1 application topically 2 (two) times daily. (Patient taking differently: Apply 1 application topically 2 (two) times daily as needed (cracking skin). ), Disp: 15 g, Rfl: 0 .  VENTOLIN HFA 108 (90 Base) MCG/ACT inhaler, Inhale 2 puffs into the lungs every 4 (four) hours as needed for wheezing or shortness of breath. (Patient taking differently: Inhale 2 puffs into the lungs every 4 (four) hours as needed for wheezing or shortness of breath. ), Disp: 18 g, Rfl: 5 .  vitamin B-12 (CYANOCOBALAMIN) 1000 MCG tablet, Take 1 tablet (1,000 mcg total) by mouth daily., Disp: 30 tablet, Rfl: 0 .   fentaNYL (DURAGESIC) 50 MCG/HR, Place 1 patch onto the skin every 3 (three) days for 30 days. (Patient not taking: Reported on 02/12/2019), Disp: 10 patch, Rfl: 0 .  ferrous sulfate 325 (65 FE) MG tablet, Take 1 tablet (325 mg total) by mouth 2 (two) times daily with a meal., Disp: 60 tablet, Rfl: 0 .  Fluticasone-Salmeterol (ADVAIR DISKUS) 250-50 MCG/DOSE AEPB, TAKE ONE PUFF TWICE DAILY., Disp: 60 each, Rfl: 5   Review of Systems  Constitutional: Negative.   HENT: Positive for congestion (with  yellow mucus).   Eyes: Negative.   Respiratory: Positive for cough.   Cardiovascular: Negative.   Gastrointestinal: Negative.   Endocrine: Negative.   Genitourinary: Negative.   Musculoskeletal: Negative.   Skin: Negative.   Allergic/Immunologic: Negative.   Neurological: Negative.   Hematological: Negative.   Psychiatric/Behavioral: Negative.     Social History   Tobacco Use  . Smoking status: Current Every Day Smoker    Packs/day: 0.50    Years: 50.00    Pack years: 25.00    Types: Cigarettes  . Smokeless tobacco: Never Used  . Tobacco comment: Previously smoked 2 ppd  Substance Use Topics  . Alcohol use: No    Alcohol/week: 0.0 standard drinks      Objective:   BP (!) 100/52 (BP Location: Left  Arm, Patient Position: Sitting, Cuff Size: Normal)   Pulse 99   Temp 98.7 F (37.1 C) (Oral)   Ht '4\' 10"'$  (1.473 m)   Wt 100 lb (45.4 kg)   SpO2 90% Comment: room air  BMI 20.90 kg/m  Vitals:   02/12/19 1053  BP: (!) 100/52  Pulse: 99  Temp: 98.7 F (37.1 C)  TempSrc: Oral  SpO2: 90%  Weight: 100 lb (45.4 kg)  Height: '4\' 10"'$  (1.473 m)     Physical Exam   General Appearance:    Alert, cooperative, no distress  Eyes:    PERRL, conjunctiva/corneas clear, EOM's intact       Lungs:     Diffuse expiratory wheezes, respirations unlabored  Heart:    Regular rate and rhythm  Neurologic:   Awake, alert, oriented x 3. No apparent focal neurological           defect.           Assessment & Plan    1. Recurrent pneumonia Resolved after hospitalization. Started to have cough again with some wheezing. restart - levofloxacin (LEVAQUIN) 750 MG tablet; Take 1 tablet (750 mg total) by mouth daily for 7 days.  Dispense: 7 tablet; Refill: 0  2. Tobacco abuse Strongly encourage tobacco cessation she is now willing to try - buPROPion (WELLBUTRIN SR) 150 MG 12 hr tablet; 1 tablet daily for 3 days, then 1 tablet twice daily. Stop smoking 14 days after starting medication   Dispense: 60 tablet; Refill: 3  She doesn't want to use patch.   3. Nausea Chronic, refill- ondansetron (ZOFRAN) 4 MG tablet; Take 1 tablet (4 mg total) by mouth as needed.  Dispense: 20 tablet; Refill: 3  4. Pulmonary emphysema, unspecified emphysema type ([place]) She does not know the name of her pulmonologist, but state she has contact information at home and she will call to schedule follow up. If she can't find contact information then will refer back to Dr. Juanell Fairly for follow up.      Lelon Huh, MD  [facility]

## 2019-02-14 DIAGNOSIS — J449 Chronic obstructive pulmonary disease, unspecified: Secondary | ICD-10-CM | POA: Diagnosis not present

## 2019-02-16 DIAGNOSIS — I110 Hypertensive heart disease with heart failure: Secondary | ICD-10-CM | POA: Diagnosis not present

## 2019-02-16 DIAGNOSIS — R6521 Severe sepsis with septic shock: Secondary | ICD-10-CM | POA: Diagnosis not present

## 2019-02-16 DIAGNOSIS — J150 Pneumonia due to Klebsiella pneumoniae: Secondary | ICD-10-CM | POA: Diagnosis not present

## 2019-02-16 DIAGNOSIS — A419 Sepsis, unspecified organism: Secondary | ICD-10-CM | POA: Diagnosis not present

## 2019-02-16 DIAGNOSIS — I2510 Atherosclerotic heart disease of native coronary artery without angina pectoris: Secondary | ICD-10-CM | POA: Diagnosis not present

## 2019-02-16 DIAGNOSIS — I5042 Chronic combined systolic (congestive) and diastolic (congestive) heart failure: Secondary | ICD-10-CM | POA: Diagnosis not present

## 2019-02-16 DIAGNOSIS — J9601 Acute respiratory failure with hypoxia: Secondary | ICD-10-CM | POA: Diagnosis not present

## 2019-02-16 DIAGNOSIS — J440 Chronic obstructive pulmonary disease with acute lower respiratory infection: Secondary | ICD-10-CM | POA: Diagnosis not present

## 2019-02-16 DIAGNOSIS — J441 Chronic obstructive pulmonary disease with (acute) exacerbation: Secondary | ICD-10-CM | POA: Diagnosis not present

## 2019-02-16 LAB — TOXASSURE SELECT 13 (MW), URINE

## 2019-02-17 ENCOUNTER — Ambulatory Visit: Payer: Self-pay | Admitting: Family Medicine

## 2019-02-18 DIAGNOSIS — I110 Hypertensive heart disease with heart failure: Secondary | ICD-10-CM | POA: Diagnosis not present

## 2019-02-18 DIAGNOSIS — J441 Chronic obstructive pulmonary disease with (acute) exacerbation: Secondary | ICD-10-CM | POA: Diagnosis not present

## 2019-02-18 DIAGNOSIS — J9601 Acute respiratory failure with hypoxia: Secondary | ICD-10-CM | POA: Diagnosis not present

## 2019-02-18 DIAGNOSIS — J150 Pneumonia due to Klebsiella pneumoniae: Secondary | ICD-10-CM | POA: Diagnosis not present

## 2019-02-18 DIAGNOSIS — I5042 Chronic combined systolic (congestive) and diastolic (congestive) heart failure: Secondary | ICD-10-CM | POA: Diagnosis not present

## 2019-02-18 DIAGNOSIS — R6521 Severe sepsis with septic shock: Secondary | ICD-10-CM | POA: Diagnosis not present

## 2019-02-18 DIAGNOSIS — I2510 Atherosclerotic heart disease of native coronary artery without angina pectoris: Secondary | ICD-10-CM | POA: Diagnosis not present

## 2019-02-18 DIAGNOSIS — A419 Sepsis, unspecified organism: Secondary | ICD-10-CM | POA: Diagnosis not present

## 2019-02-18 DIAGNOSIS — J440 Chronic obstructive pulmonary disease with acute lower respiratory infection: Secondary | ICD-10-CM | POA: Diagnosis not present

## 2019-02-19 DIAGNOSIS — J440 Chronic obstructive pulmonary disease with acute lower respiratory infection: Secondary | ICD-10-CM | POA: Diagnosis not present

## 2019-02-19 DIAGNOSIS — J150 Pneumonia due to Klebsiella pneumoniae: Secondary | ICD-10-CM | POA: Diagnosis not present

## 2019-02-19 DIAGNOSIS — I5042 Chronic combined systolic (congestive) and diastolic (congestive) heart failure: Secondary | ICD-10-CM | POA: Diagnosis not present

## 2019-02-19 DIAGNOSIS — I110 Hypertensive heart disease with heart failure: Secondary | ICD-10-CM | POA: Diagnosis not present

## 2019-02-19 DIAGNOSIS — R6521 Severe sepsis with septic shock: Secondary | ICD-10-CM | POA: Diagnosis not present

## 2019-02-19 DIAGNOSIS — J441 Chronic obstructive pulmonary disease with (acute) exacerbation: Secondary | ICD-10-CM | POA: Diagnosis not present

## 2019-02-19 DIAGNOSIS — J9601 Acute respiratory failure with hypoxia: Secondary | ICD-10-CM | POA: Diagnosis not present

## 2019-02-19 DIAGNOSIS — I2510 Atherosclerotic heart disease of native coronary artery without angina pectoris: Secondary | ICD-10-CM | POA: Diagnosis not present

## 2019-02-19 DIAGNOSIS — A419 Sepsis, unspecified organism: Secondary | ICD-10-CM | POA: Diagnosis not present

## 2019-02-21 DIAGNOSIS — J189 Pneumonia, unspecified organism: Secondary | ICD-10-CM | POA: Diagnosis not present

## 2019-02-21 DIAGNOSIS — M6281 Muscle weakness (generalized): Secondary | ICD-10-CM | POA: Diagnosis not present

## 2019-02-21 DIAGNOSIS — R4182 Altered mental status, unspecified: Secondary | ICD-10-CM | POA: Diagnosis not present

## 2019-02-21 DIAGNOSIS — J449 Chronic obstructive pulmonary disease, unspecified: Secondary | ICD-10-CM | POA: Diagnosis not present

## 2019-02-23 ENCOUNTER — Telehealth: Payer: Self-pay

## 2019-02-23 DIAGNOSIS — A419 Sepsis, unspecified organism: Secondary | ICD-10-CM | POA: Diagnosis not present

## 2019-02-23 DIAGNOSIS — J150 Pneumonia due to Klebsiella pneumoniae: Secondary | ICD-10-CM | POA: Diagnosis not present

## 2019-02-23 DIAGNOSIS — J9621 Acute and chronic respiratory failure with hypoxia: Secondary | ICD-10-CM | POA: Diagnosis not present

## 2019-02-23 DIAGNOSIS — I5042 Chronic combined systolic (congestive) and diastolic (congestive) heart failure: Secondary | ICD-10-CM | POA: Diagnosis not present

## 2019-02-23 DIAGNOSIS — I110 Hypertensive heart disease with heart failure: Secondary | ICD-10-CM | POA: Diagnosis not present

## 2019-02-23 DIAGNOSIS — N179 Acute kidney failure, unspecified: Secondary | ICD-10-CM | POA: Diagnosis not present

## 2019-02-23 DIAGNOSIS — I2510 Atherosclerotic heart disease of native coronary artery without angina pectoris: Secondary | ICD-10-CM | POA: Diagnosis not present

## 2019-02-23 DIAGNOSIS — J440 Chronic obstructive pulmonary disease with acute lower respiratory infection: Secondary | ICD-10-CM | POA: Diagnosis not present

## 2019-02-23 DIAGNOSIS — J441 Chronic obstructive pulmonary disease with (acute) exacerbation: Secondary | ICD-10-CM | POA: Diagnosis not present

## 2019-02-23 MED ORDER — CEPHALEXIN 500 MG PO CAPS: 500.0000 mg | ORAL_CAPSULE | Freq: Four times a day (QID) | ORAL | 0 refills | Status: DC

## 2019-02-23 NOTE — Telephone Encounter (Signed)
Joannie Springs with [facility] called to report patient's legs are red and swollen below knee to the ankle. She states it appears blisters are about to form. Possible cellulitis. Patient uses [facility]. [phone]

## 2019-02-23 NOTE — Telephone Encounter (Signed)
Please advise have sent prescription for cephalexin to [facility].

## 2019-02-23 NOTE — Telephone Encounter (Signed)
Patient advised and verbally voiced understanding.  

## 2019-02-24 DIAGNOSIS — J440 Chronic obstructive pulmonary disease with acute lower respiratory infection: Secondary | ICD-10-CM | POA: Diagnosis not present

## 2019-02-24 DIAGNOSIS — J441 Chronic obstructive pulmonary disease with (acute) exacerbation: Secondary | ICD-10-CM | POA: Diagnosis not present

## 2019-02-24 DIAGNOSIS — I5042 Chronic combined systolic (congestive) and diastolic (congestive) heart failure: Secondary | ICD-10-CM | POA: Diagnosis not present

## 2019-02-24 DIAGNOSIS — I110 Hypertensive heart disease with heart failure: Secondary | ICD-10-CM | POA: Diagnosis not present

## 2019-02-24 DIAGNOSIS — I2510 Atherosclerotic heart disease of native coronary artery without angina pectoris: Secondary | ICD-10-CM | POA: Diagnosis not present

## 2019-02-24 DIAGNOSIS — N179 Acute kidney failure, unspecified: Secondary | ICD-10-CM | POA: Diagnosis not present

## 2019-02-24 DIAGNOSIS — J150 Pneumonia due to Klebsiella pneumoniae: Secondary | ICD-10-CM | POA: Diagnosis not present

## 2019-02-24 DIAGNOSIS — J9621 Acute and chronic respiratory failure with hypoxia: Secondary | ICD-10-CM | POA: Diagnosis not present

## 2019-02-24 DIAGNOSIS — A419 Sepsis, unspecified organism: Secondary | ICD-10-CM | POA: Diagnosis not present

## 2019-02-25 DIAGNOSIS — I2510 Atherosclerotic heart disease of native coronary artery without angina pectoris: Secondary | ICD-10-CM | POA: Diagnosis not present

## 2019-02-25 DIAGNOSIS — I5042 Chronic combined systolic (congestive) and diastolic (congestive) heart failure: Secondary | ICD-10-CM | POA: Diagnosis not present

## 2019-02-25 DIAGNOSIS — J440 Chronic obstructive pulmonary disease with acute lower respiratory infection: Secondary | ICD-10-CM | POA: Diagnosis not present

## 2019-02-25 DIAGNOSIS — I110 Hypertensive heart disease with heart failure: Secondary | ICD-10-CM | POA: Diagnosis not present

## 2019-02-25 DIAGNOSIS — J441 Chronic obstructive pulmonary disease with (acute) exacerbation: Secondary | ICD-10-CM | POA: Diagnosis not present

## 2019-02-25 DIAGNOSIS — J150 Pneumonia due to Klebsiella pneumoniae: Secondary | ICD-10-CM | POA: Diagnosis not present

## 2019-02-25 DIAGNOSIS — A419 Sepsis, unspecified organism: Secondary | ICD-10-CM | POA: Diagnosis not present

## 2019-02-25 DIAGNOSIS — N179 Acute kidney failure, unspecified: Secondary | ICD-10-CM | POA: Diagnosis not present

## 2019-02-25 DIAGNOSIS — J9621 Acute and chronic respiratory failure with hypoxia: Secondary | ICD-10-CM | POA: Diagnosis not present

## 2019-02-26 DIAGNOSIS — A419 Sepsis, unspecified organism: Secondary | ICD-10-CM | POA: Diagnosis not present

## 2019-02-26 DIAGNOSIS — N179 Acute kidney failure, unspecified: Secondary | ICD-10-CM | POA: Diagnosis not present

## 2019-02-26 DIAGNOSIS — J9621 Acute and chronic respiratory failure with hypoxia: Secondary | ICD-10-CM | POA: Diagnosis not present

## 2019-02-26 DIAGNOSIS — I5042 Chronic combined systolic (congestive) and diastolic (congestive) heart failure: Secondary | ICD-10-CM | POA: Diagnosis not present

## 2019-02-26 DIAGNOSIS — J441 Chronic obstructive pulmonary disease with (acute) exacerbation: Secondary | ICD-10-CM | POA: Diagnosis not present

## 2019-02-26 DIAGNOSIS — I110 Hypertensive heart disease with heart failure: Secondary | ICD-10-CM | POA: Diagnosis not present

## 2019-02-26 DIAGNOSIS — J440 Chronic obstructive pulmonary disease with acute lower respiratory infection: Secondary | ICD-10-CM | POA: Diagnosis not present

## 2019-02-26 DIAGNOSIS — J150 Pneumonia due to Klebsiella pneumoniae: Secondary | ICD-10-CM | POA: Diagnosis not present

## 2019-02-26 DIAGNOSIS — I2510 Atherosclerotic heart disease of native coronary artery without angina pectoris: Secondary | ICD-10-CM | POA: Diagnosis not present

## 2019-02-27 ENCOUNTER — Other Ambulatory Visit: Payer: Self-pay

## 2019-02-27 ENCOUNTER — Emergency Department: Payer: Medicare HMO

## 2019-02-27 ENCOUNTER — Inpatient Hospital Stay
Admission: EM | Admit: 2019-02-27 | Discharge: 2019-03-02 | DRG: 871 | Disposition: A | Discharge status: Home Health | Payer: Medicare HMO | Attending: Internal Medicine | Admitting: Internal Medicine

## 2019-02-27 DIAGNOSIS — Z886 Allergy status to analgesic agent status: Secondary | ICD-10-CM

## 2019-02-27 DIAGNOSIS — Z9842 Cataract extraction status, left eye: Secondary | ICD-10-CM

## 2019-02-27 DIAGNOSIS — R05 Cough: Secondary | ICD-10-CM | POA: Diagnosis not present

## 2019-02-27 DIAGNOSIS — G9340 Encephalopathy, unspecified: Secondary | ICD-10-CM

## 2019-02-27 DIAGNOSIS — Z882 Allergy status to sulfonamides status: Secondary | ICD-10-CM | POA: Diagnosis not present

## 2019-02-27 DIAGNOSIS — J9610 Chronic respiratory failure, unspecified whether with hypoxia or hypercapnia: Secondary | ICD-10-CM | POA: Diagnosis not present

## 2019-02-27 DIAGNOSIS — Z885 Allergy status to narcotic agent status: Secondary | ICD-10-CM | POA: Diagnosis not present

## 2019-02-27 DIAGNOSIS — Z7951 Long term (current) use of inhaled steroids: Secondary | ICD-10-CM | POA: Diagnosis not present

## 2019-02-27 DIAGNOSIS — Z79899 Other long term (current) drug therapy: Secondary | ICD-10-CM

## 2019-02-27 DIAGNOSIS — J189 Pneumonia, unspecified organism: Secondary | ICD-10-CM | POA: Diagnosis present

## 2019-02-27 DIAGNOSIS — Z961 Presence of intraocular lens: Secondary | ICD-10-CM | POA: Diagnosis present

## 2019-02-27 DIAGNOSIS — F17210 Nicotine dependence, cigarettes, uncomplicated: Secondary | ICD-10-CM | POA: Diagnosis present

## 2019-02-27 DIAGNOSIS — Z8249 Family history of ischemic heart disease and other diseases of the circulatory system: Secondary | ICD-10-CM | POA: Diagnosis not present

## 2019-02-27 DIAGNOSIS — I10 Essential (primary) hypertension: Secondary | ICD-10-CM | POA: Diagnosis not present

## 2019-02-27 DIAGNOSIS — Z7902 Long term (current) use of antithrombotics/antiplatelets: Secondary | ICD-10-CM

## 2019-02-27 DIAGNOSIS — Z20828 Contact with and (suspected) exposure to other viral communicable diseases: Secondary | ICD-10-CM | POA: Diagnosis present

## 2019-02-27 DIAGNOSIS — R6520 Severe sepsis without septic shock: Secondary | ICD-10-CM

## 2019-02-27 DIAGNOSIS — A419 Sepsis, unspecified organism: Secondary | ICD-10-CM | POA: Diagnosis not present

## 2019-02-27 DIAGNOSIS — Z8701 Personal history of pneumonia (recurrent): Secondary | ICD-10-CM

## 2019-02-27 DIAGNOSIS — J440 Chronic obstructive pulmonary disease with acute lower respiratory infection: Secondary | ICD-10-CM | POA: Diagnosis present

## 2019-02-27 DIAGNOSIS — Z90710 Acquired absence of both cervix and uterus: Secondary | ICD-10-CM

## 2019-02-27 DIAGNOSIS — R6889 Other general symptoms and signs: Secondary | ICD-10-CM | POA: Diagnosis not present

## 2019-02-27 DIAGNOSIS — R000 Tachycardia, unspecified: Secondary | ICD-10-CM | POA: Diagnosis not present

## 2019-02-27 DIAGNOSIS — Y95 Nosocomial condition: Secondary | ICD-10-CM | POA: Diagnosis present

## 2019-02-27 DIAGNOSIS — Z9841 Cataract extraction status, right eye: Secondary | ICD-10-CM

## 2019-02-27 DIAGNOSIS — K219 Gastro-esophageal reflux disease without esophagitis: Secondary | ICD-10-CM | POA: Diagnosis not present

## 2019-02-27 DIAGNOSIS — E785 Hyperlipidemia, unspecified: Secondary | ICD-10-CM | POA: Diagnosis not present

## 2019-02-27 DIAGNOSIS — J690 Pneumonitis due to inhalation of food and vomit: Secondary | ICD-10-CM | POA: Diagnosis not present

## 2019-02-27 DIAGNOSIS — R509 Fever, unspecified: Secondary | ICD-10-CM | POA: Diagnosis present

## 2019-02-27 DIAGNOSIS — R5381 Other malaise: Secondary | ICD-10-CM | POA: Diagnosis present

## 2019-02-27 LAB — CBC WITH DIFFERENTIAL/PLATELET
Abs Immature Granulocytes: 0.05 10*3/uL (ref 0.00–0.07)
Basophils Absolute: 0.1 10*3/uL (ref 0.0–0.1)
Basophils Relative: 1 %
Eosinophils Absolute: 0.1 10*3/uL (ref 0.0–0.5)
Eosinophils Relative: 1 %
HCT: 35 % — ABNORMAL LOW (ref 36.0–46.0)
Hemoglobin: 10.9 g/dL — ABNORMAL LOW (ref 12.0–15.0)
Immature Granulocytes: 0 %
Lymphocytes Relative: 10 %
Lymphs Abs: 1.2 10*3/uL (ref 0.7–4.0)
MCH: 30.9 pg (ref 26.0–34.0)
MCHC: 31.1 g/dL (ref 30.0–36.0)
MCV: 99.2 fL (ref 80.0–100.0)
Monocytes Absolute: 0.8 10*3/uL (ref 0.1–1.0)
Monocytes Relative: 7 %
Neutro Abs: 9.7 10*3/uL — ABNORMAL HIGH (ref 1.7–7.7)
Neutrophils Relative %: 81 %
Platelets: 232 10*3/uL (ref 150–400)
RBC: 3.53 MIL/uL — ABNORMAL LOW (ref 3.87–5.11)
RDW: 14.9 % (ref 11.5–15.5)
WBC: 11.9 10*3/uL — ABNORMAL HIGH (ref 4.0–10.5)
nRBC: 0 % (ref 0.0–0.2)

## 2019-02-27 LAB — URINALYSIS, COMPLETE (UACMP) WITH MICROSCOPIC
Bacteria, UA: NONE SEEN
Bilirubin Urine: NEGATIVE
Glucose, UA: NEGATIVE mg/dL
Hgb urine dipstick: NEGATIVE
Ketones, ur: NEGATIVE mg/dL
Leukocytes,Ua: NEGATIVE
Nitrite: NEGATIVE
Protein, ur: NEGATIVE mg/dL
Specific Gravity, Urine: 1.012 (ref 1.005–1.030)
pH: 6 (ref 5.0–8.0)

## 2019-02-27 LAB — COMPREHENSIVE METABOLIC PANEL
ALT: 9 U/L (ref 0–44)
AST: 15 U/L (ref 15–41)
Albumin: 3.6 g/dL (ref 3.5–5.0)
Alkaline Phosphatase: 66 U/L (ref 38–126)
Anion gap: 9 (ref 5–15)
BUN: 14 mg/dL (ref 8–23)
CO2: 28 mmol/L (ref 22–32)
Calcium: 8.4 mg/dL — ABNORMAL LOW (ref 8.9–10.3)
Chloride: 104 mmol/L (ref 98–111)
Creatinine, Ser: 0.79 mg/dL (ref 0.44–1.00)
GFR calc Af Amer: >60 mL/min (ref >60)
GFR calc non Af Amer: >60 mL/min (ref >60)
Glucose, Bld: 96 mg/dL (ref 70–99)
Potassium: 3.9 mmol/L (ref 3.5–5.1)
Sodium: 141 mmol/L (ref 135–145)
Total Bilirubin: 0.9 mg/dL (ref 0.3–1.2)
Total Protein: 6.6 g/dL (ref 6.5–8.1)

## 2019-02-27 LAB — PROTIME-INR
INR: 1 (ref 0.8–1.2)
Prothrombin Time: 13.3 seconds (ref 11.4–15.2)

## 2019-02-27 LAB — INFLUENZA PANEL BY PCR (TYPE A & B)
Influenza A By PCR: NEGATIVE
Influenza B By PCR: NEGATIVE

## 2019-02-27 LAB — LACTIC ACID, PLASMA
Lactic Acid, Venous: 1 mmol/L (ref 0.5–1.9)
Lactic Acid, Venous: 1.3 mmol/L (ref 0.5–1.9)

## 2019-02-27 LAB — PROCALCITONIN: Procalcitonin: 0.64 ng/mL

## 2019-02-27 MED ORDER — MIRTAZAPINE 15 MG PO TBDP
15.0000 mg | ORAL_TABLET | Freq: Every day | ORAL | Status: DC
  Administered 2019-02-27 – 2019-03-01 (×3): 15 mg via ORAL
  Filled 2019-02-27 (×4): qty 1

## 2019-02-27 MED ORDER — SODIUM CHLORIDE 0.9 % IV SOLN
3.0000 g | Freq: Once | INTRAVENOUS | Status: AC
  Administered 2019-02-27: 15:00:00 3 g via INTRAVENOUS
  Filled 2019-02-27: qty 3

## 2019-02-27 MED ORDER — VANCOMYCIN HCL 500 MG IV SOLR
500.0000 mg | Freq: Once | INTRAVENOUS | Status: AC
  Administered 2019-02-27: 17:00:00 500 mg via INTRAVENOUS
  Filled 2019-02-27: qty 500

## 2019-02-27 MED ORDER — DULOXETINE HCL 30 MG PO CPEP
30.0000 mg | ORAL_CAPSULE | Freq: Every day | ORAL | Status: DC
  Administered 2019-02-28 – 2019-03-02 (×3): 30 mg via ORAL
  Filled 2019-02-27 (×3): qty 1

## 2019-02-27 MED ORDER — ACETAMINOPHEN 650 MG RE SUPP: 650.0000 mg | Freq: Four times a day (QID) | RECTAL | Status: DC | PRN

## 2019-02-27 MED ORDER — VITAMIN B-12 1000 MCG PO TABS
1000.0000 ug | ORAL_TABLET | Freq: Every day | ORAL | Status: DC
  Administered 2019-02-28 – 2019-03-02 (×3): 1000 ug via ORAL
  Filled 2019-02-27 (×3): qty 1

## 2019-02-27 MED ORDER — NICOTINE 21 MG/24HR TD PT24
21.0000 mg | MEDICATED_PATCH | Freq: Every day | TRANSDERMAL | Status: DC
  Administered 2019-02-28 – 2019-03-02 (×3): 21 mg via TRANSDERMAL
  Filled 2019-02-27 (×3): qty 1

## 2019-02-27 MED ORDER — SODIUM CHLORIDE 0.9 % IV SOLN
INTRAVENOUS | Status: DC
  Administered 2019-02-27 – 2019-03-01 (×4): via INTRAVENOUS

## 2019-02-27 MED ORDER — SENNOSIDES-DOCUSATE SODIUM 8.6-50 MG PO TABS: 1.0000 | ORAL_TABLET | Freq: Every evening | ORAL | Status: DC | PRN

## 2019-02-27 MED ORDER — SODIUM CHLORIDE 0.9 % IV BOLUS
1000.0000 mL | Freq: Once | INTRAVENOUS | Status: AC
  Administered 2019-02-27: 14:00:00 1000 mL via INTRAVENOUS

## 2019-02-27 MED ORDER — ALPRAZOLAM 0.25 MG PO TABS: 0.2500 mg | ORAL_TABLET | Freq: Every evening | ORAL | Status: DC | PRN

## 2019-02-27 MED ORDER — SODIUM CHLORIDE 0.9 % IV SOLN
3.0000 g | Freq: Four times a day (QID) | INTRAVENOUS | Status: DC
  Administered 2019-02-27 – 2019-03-02 (×11): 3 g via INTRAVENOUS
  Filled 2019-02-27 (×16): qty 3

## 2019-02-27 MED ORDER — ACETAMINOPHEN 325 MG PO TABS: 650.0000 mg | ORAL_TABLET | Freq: Four times a day (QID) | ORAL | Status: DC | PRN

## 2019-02-27 MED ORDER — ONDANSETRON HCL 4 MG/2ML IJ SOLN
4.0000 mg | Freq: Four times a day (QID) | INTRAMUSCULAR | Status: DC | PRN
  Administered 2019-02-28 – 2019-03-01 (×2): 4 mg via INTRAVENOUS
  Filled 2019-02-27 (×2): qty 2

## 2019-02-27 MED ORDER — PREGABALIN 75 MG PO CAPS
300.0000 mg | ORAL_CAPSULE | Freq: Two times a day (BID) | ORAL | Status: DC
  Administered 2019-02-27 – 2019-03-01 (×4): 300 mg via ORAL
  Filled 2019-02-27 (×4): qty 4

## 2019-02-27 MED ORDER — ACETAMINOPHEN 650 MG RE SUPP
650.0000 mg | Freq: Once | RECTAL | Status: AC
  Administered 2019-02-27: 14:00:00 650 mg via RECTAL

## 2019-02-27 MED ORDER — PANTOPRAZOLE SODIUM 40 MG PO TBEC
40.0000 mg | DELAYED_RELEASE_TABLET | Freq: Every day | ORAL | Status: DC
  Administered 2019-02-28 – 2019-03-02 (×3): 40 mg via ORAL
  Filled 2019-02-27 (×3): qty 1

## 2019-02-27 MED ORDER — IPRATROPIUM-ALBUTEROL 20-100 MCG/ACT IN AERS
1.0000 | INHALATION_SPRAY | Freq: Four times a day (QID) | RESPIRATORY_TRACT | Status: DC
  Administered 2019-02-27 – 2019-03-01 (×8): 1 via RESPIRATORY_TRACT
  Filled 2019-02-27: qty 4

## 2019-02-27 MED ORDER — BENZONATATE 100 MG PO CAPS: 100.0000 mg | ORAL_CAPSULE | Freq: Three times a day (TID) | ORAL | Status: DC | PRN

## 2019-02-27 MED ORDER — ATORVASTATIN CALCIUM 20 MG PO TABS
40.0000 mg | ORAL_TABLET | Freq: Every day | ORAL | Status: DC
  Administered 2019-02-28 – 2019-03-01 (×2): 40 mg via ORAL
  Filled 2019-02-27 (×2): qty 2

## 2019-02-27 MED ORDER — VANCOMYCIN HCL IN DEXTROSE 1-5 GM/200ML-% IV SOLN
1000.0000 mg | INTRAVENOUS | Status: DC
  Filled 2019-02-27: qty 200

## 2019-02-27 MED ORDER — ENOXAPARIN SODIUM 40 MG/0.4ML ~~LOC~~ SOLN
40.0000 mg | SUBCUTANEOUS | Status: DC
  Administered 2019-02-27 – 2019-03-01 (×3): 40 mg via SUBCUTANEOUS
  Filled 2019-02-27 (×3): qty 0.4

## 2019-02-27 MED ORDER — FERROUS SULFATE 325 (65 FE) MG PO TABS
325.0000 mg | ORAL_TABLET | Freq: Two times a day (BID) | ORAL | Status: DC
  Administered 2019-02-28 – 2019-03-02 (×5): 325 mg via ORAL
  Filled 2019-02-27 (×5): qty 1

## 2019-02-27 MED ORDER — MEMANTINE HCL 10 MG PO TABS
10.0000 mg | ORAL_TABLET | Freq: Two times a day (BID) | ORAL | Status: DC
  Administered 2019-02-27 – 2019-03-02 (×6): 10 mg via ORAL
  Filled 2019-02-27 (×7): qty 1

## 2019-02-27 MED ORDER — VANCOMYCIN HCL IN DEXTROSE 1-5 GM/200ML-% IV SOLN
1000.0000 mg | Freq: Once | INTRAVENOUS | Status: AC
  Administered 2019-02-27: 15:00:00 1000 mg via INTRAVENOUS
  Filled 2019-02-27: qty 200

## 2019-02-27 MED ORDER — ORAL CARE MOUTH RINSE
15.0000 mL | Freq: Two times a day (BID) | OROMUCOSAL | Status: DC
  Administered 2019-02-28: 15 mL via OROMUCOSAL

## 2019-02-27 MED ORDER — BACLOFEN 10 MG PO TABS
10.0000 mg | ORAL_TABLET | Freq: Three times a day (TID) | ORAL | Status: DC | PRN
  Filled 2019-02-27: qty 1

## 2019-02-27 MED ORDER — ONDANSETRON HCL 4 MG PO TABS: 4.0000 mg | ORAL_TABLET | Freq: Four times a day (QID) | ORAL | Status: DC | PRN

## 2019-02-27 MED ORDER — CLOPIDOGREL BISULFATE 75 MG PO TABS
75.0000 mg | ORAL_TABLET | Freq: Every day | ORAL | Status: DC
  Administered 2019-02-28 – 2019-03-02 (×3): 75 mg via ORAL
  Filled 2019-02-27 (×3): qty 1

## 2019-02-27 NOTE — Progress Notes (Addendum)
Room 241. Placed in negative pressure room to rule out COVID.  Oriented to room, bed alarm on for safety.  Patient oriented, easily arousable but lethargic at this time.  This RN went over admission with spouse on phone.  Bed alarm on for safety.  Patient noted to have fentanyl patch on back.  Will speak to spouse to see when needs to be changed and pass on to oncoming nurse.

## 2019-02-27 NOTE — Consult Note (Signed)
Pharmacy Antibiotic Note  Cheryl [name] is a 68 y.o. female admitted on 02/27/2019 with pneumonia.  Pharmacy has been consulted for Unasyn and vancomycin dosing.  Plan: Will give Unasyn 3 g q6H. Patient received vancomycin 1000 mg x 1 in the ED. Will give another 500 mg x 1 for a total of 1500 mg loading dose. Patient will be started on a maintenance dose of 1000 mg q36H for a predicted AUC of 507. Goal AUC is 400-550. Scr used 0.79. Calc Css min 9.9. BMI > 30. Plan to order a MRSA PCR. If negative recommend discontinuing vancomycin. Plan to order vancomycin level in 4-5 days.   Height: 4\' 10"  (147.3 cm) Weight: 145 lb (65.8 kg) IBW/kg (Calculated) : 40.9  Temp (24hrs), Avg:101.3 F (38.5 C), Min:101.3 F (38.5 C), Max:101.3 F (38.5 C)  Recent Labs  Lab 02/27/19 1404  WBC 11.9*  CREATININE 0.79  LATICACIDVEN 1.0    Estimated Creatinine Clearance: 52.6 mL/min (by C-G formula based on SCr of 0.79 mg/dL).    Allergies  Allergen Reactions  . Percocet [Oxycodone-Acetaminophen] Hives and Rash  . Aspirin Nausea And Vomiting and Other (See Comments)  . Codeine Nausea And Vomiting, Nausea Only and Other (See Comments)  . Propoxyphene Nausea Only and Other (See Comments)  . Sulfa Antibiotics Rash and Other (See Comments)    Antimicrobials this admission: 4/4 Unasyn >>  4/4 Vancomycin >>   Dose adjustments this admission: None  Microbiology results: 4/4 BCx: pending  4/4 MRSA PCR: Ordered  Thank you for allowing pharmacy to be a part of this patient's care.  Ronnald Ramp, PharmD, BCPS  02/27/2019 3:30 PM

## 2019-02-27 NOTE — H&P (Signed)
[place] [facility] [place] NAME: Cheryl [name]    MR#:  [id number]  DATE OF BIRTH:  July 28, 1951  DATE OF ADMISSION:  02/27/2019  PRIMARY CARE PHYSICIAN: Birdie Sons, MD   REQUESTING/REFERRING PHYSICIAN:   CHIEF COMPLAINT:   Chief Complaint  Patient presents with  . Fever  . Cough    HISTORY OF PRESENT ILLNESS: Cheryl [name]  is a 68 y.o. female with a known history of pneumonia, aspiration pneumonitis, GERD, hyperlipidemia, hypertension, dependent on chronic oxygen, hyperlipidemia, hypertension, COPD presented to the emergency room for fever and cough.  Cough is productive of phlegm.  Patient was recently treated for pneumonia and discharged from our hospital.  Still has cough and shortness of breath.  Has generalized weakness.  Currently on oxygen via nasal cannula at 3 L which is at baseline.  Monitor family members they think patient would have aspirated.  She was evaluated in the emergency room flu test was sent and COVID-19 virus test was also sent patient put in droplet and contact precautions.  No history of any recent travel, sick contacts at home.  No history of any international visitors according to the patient.  PAST MEDICAL HISTORY:   Past Medical History:  Diagnosis Date  . Acute encephalopathy 06/18/2018  . Acute postoperative pain 04/14/2017  . Allergy   . Anemia   . Aspiration pneumonitis ([place]) 11/24/2015  . Asthma   . Chronic pain   . DVT (deep venous thrombosis) ([place])   . Dysrhythmia   . Encephalopathy    MULTIPLE TIMES  . GERD (gastroesophageal reflux disease)   . Hyperlipidemia   . Hypertension   . Kyphoscoliosis deformity of spine   . Metabolic encephalopathy 9/52/8413  . Migraines   . Neuropathy 2010  . NSTEMI (non-ST elevated myocardial infarction) ([place])   . Oxygen deficiency   . Pneumonia   . Pneumonia 11/19/2015  . Pneumonia 10/2016  . Pneumonia    aspiration 2018- 4 times in last year  . Rib fracture  09/19/2017   Displace right 9th rib fracture.   . Wears dentures    full upper and lower    PAST SURGICAL HISTORY:  Past Surgical History:  Procedure Laterality Date  . ABDOMINAL HYSTERECTOMY  1975   Bilaterl Oophorectomy; [place] to IUD infection  . abdomnal aortic stent  05/30/2008   Dr. Quay Burow  . APPENDECTOMY    . APPENDECTOMY    . BACK SURGERY    . cardiac catherization  10/31/2009  . CARDIAC CATHETERIZATION    . CATARACT EXTRACTION W/PHACO Left 08/20/2018   Procedure: CATARACT EXTRACTION PHACO AND INTRAOCULAR LENS PLACEMENT (IOC);  Surgeon: Eulogio Bear, MD;  Location: [facility] ORS;  Service: Ophthalmology;  Laterality: Left;  [place] 00:45.1 AP% 12.3 CDE 5.54 FLUID PACK LOT # [id number]  . CATARACT EXTRACTION W/PHACO Right 09/23/2018   Procedure: CATARACT EXTRACTION PHACO AND INTRAOCULAR LENS PLACEMENT ([place]);  Surgeon: Eulogio Bear, MD;  Location: [facility] ORS;  Service: Ophthalmology;  Laterality: Right;  [place] 00:45.0 CDE 5.19 Fluid Pack lot # [id number]  . CERVICAL FUSION  C5 - 6/C6-7  . CHOLECYSTECTOMY  1972  . COLONOSCOPY WITH PROPOFOL N/A 07/27/2015   Procedure: COLONOSCOPY WITH PROPOFOL;  Surgeon: Hulen Luster, MD;  Location: [facility] ENDOSCOPY;  Service: Gastroenterology;  Laterality: N/A;  . ESOPHAGOGASTRODUODENOSCOPY (EGD) WITH PROPOFOL N/A 07/27/2015   Procedure: ESOPHAGOGASTRODUODENOSCOPY (EGD) WITH PROPOFOL;  Surgeon: Hulen Luster, MD;  Location: [facility] ENDOSCOPY;  Service: Gastroenterology;  Laterality: N/A;  . ESOPHAGOGASTRODUODENOSCOPY (EGD) WITH PROPOFOL N/A 05/19/2018   Procedure: ESOPHAGOGASTRODUODENOSCOPY (EGD) WITH PROPOFOL;  Surgeon: Lucilla Lame, MD;  Location: [facility] ENDOSCOPY;  Service: Endoscopy;  Laterality: N/A;  . EYE SURGERY    . FOOT SURGERY Bilateral    5-6 years per patient  . SPINE SURGERY    . TONSILLECTOMY    . VASCULAR SURGERY     LEG STENTS    SOCIAL HISTORY:  Social History   Tobacco Use  . Smoking status: Current Every Day Smoker    Years: 50.00     Types: Cigarettes  . Smokeless tobacco: Never Used  . Tobacco comment: Previously smoked 2 ppd  Substance Use Topics  . Alcohol use: No    Alcohol/week: 0.0 standard drinks    FAMILY HISTORY:  Family History  Problem Relation Age of Onset  . Cancer Mother   . Arthritis Mother   . Heart disease Mother   . Diabetes Mother        mellitus, type 2  . Heart disease Father   . Diabetes Sister   . Cancer Brother   . Cancer Brother        lung  . Diabetes Brother     DRUG ALLERGIES:  Allergies  Allergen Reactions  . Percocet [Oxycodone-Acetaminophen] Hives and Rash  . Aspirin Nausea And Vomiting and Other (See Comments)  . Codeine Nausea And Vomiting, Nausea Only and Other (See Comments)  . Propoxyphene Nausea Only and Other (See Comments)  . Sulfa Antibiotics Rash and Other (See Comments)    REVIEW OF SYSTEMS:   CONSTITUTIONAL: Had fever, fatigue and weakness.  EYES: No blurred or double vision.  EARS, NOSE, AND THROAT: No tinnitus or ear pain.  RESPIRATORY: Had cough, shortness of breath, mild wheezing  no hemoptysis.  CARDIOVASCULAR: No chest pain, orthopnea, edema.  GASTROINTESTINAL: No nausea, vomiting, diarrhea or abdominal pain.  GENITOURINARY: No dysuria, hematuria.  ENDOCRINE: No polyuria, nocturia,  HEMATOLOGY: No anemia, easy bruising or bleeding SKIN: No rash or lesion. MUSCULOSKELETAL: No joint pain or arthritis.   NEUROLOGIC: No tingling, numbness, weakness.  PSYCHIATRY: No anxiety or depression.   MEDICATIONS AT HOME:  Prior to Admission medications   Medication Sig Start Date End Date Taking? Authorizing Provider  acetaminophen (TYLENOL) 325 MG tablet Take 2 tablets (650 mg total) by mouth every 6 (six) hours as needed for mild pain (or Fever >/= 101). 05/21/18  Yes Wieting, Richard, MD  albuterol (PROVENTIL) (2.5 MG/3ML) 0.083% nebulizer solution Take 3 mLs (2.5 mg total) by nebulization every 4 (four) hours as needed for wheezing. 02/10/19  Yes  Birdie Sons, MD  alendronate (FOSAMAX) 70 MG tablet Take 1 tablet (70 mg total) by mouth once a week. Take with a full glass of water on an empty stomach. 12/04/18  Yes Birdie Sons, MD  ALPRAZolam Duanne Moron) 0.5 MG tablet Take 0.5-1 tablets (0.25-0.5 mg total) by mouth at bedtime as needed for anxiety. 02/02/19  Yes Birdie Sons, MD  atorvastatin (LIPITOR) 40 MG tablet Take 1 tablet (40 mg total) by mouth daily at 6 PM. 02/09/19  Yes Fisher, Kirstie Peri, MD  baclofen (LIORESAL) 10 MG tablet Take 1 tablet (10 mg total) by mouth 3 (three) times daily as needed for muscle spasms. 08/17/18  Yes Triplett, Cari B, FNP  BELSOMRA 5 MG TABS Take 1 tablet by mouth at bedtime as needed (for insomnia). Patient taking differently: Take 5 mg by mouth at bedtime.  11/16/18  Yes  Birdie Sons, MD  benzonatate (TESSALON PERLES) 100 MG capsule Take 1 capsule (100 mg total) by mouth 3 (three) times daily as needed for cough. 02/03/19  Yes Demetrios Loll, MD  buPROPion [facility] SR) 150 MG 12 hr tablet 1 tablet daily for 3 days, then 1 tablet twice daily. Stop smoking 14 days after starting medication Patient taking differently: Take 150 mg by mouth 2 (two) times daily.  02/12/19 06/12/19 Yes Birdie Sons, MD  celecoxib (CELEBREX) 100 MG capsule Take 100 mg by mouth 2 (two) times daily as needed for mild pain or moderate pain.  11/23/18  Yes [provider]  cephALEXin (KEFLEX) 500 MG capsule Take 1 capsule (500 mg total) by mouth 4 (four) times daily for 10 days. 02/23/19 03/05/19 Yes Birdie Sons, MD  clopidogrel (PLAVIX) 75 MG tablet Take 75 mg by mouth daily.   Yes [provider]  diphenoxylate-atropine (LOMOTIL) 2.5-0.025 MG tablet Take 1 tablet by mouth as needed for diarrhea or loose stools. 06/10/18  Yes [provider]  DULoxetine (CYMBALTA) 30 MG capsule Take 30 mg by mouth daily. 01/12/19  Yes [provider]  estradiol (ESTRACE) 0.5 MG tablet Take 1 tablet (0.5 mg  total) by mouth every other day. 09/09/18  Yes Birdie Sons, MD  ferrous sulfate 325 (65 FE) MG tablet Take 1 tablet (325 mg total) by mouth 2 (two) times daily with a meal. 01/11/19  Yes Birdie Sons, MD  Fluticasone-Salmeterol (ADVAIR DISKUS) 250-50 MCG/DOSE AEPB TAKE ONE PUFF TWICE DAILY. Patient taking differently: Inhale 1 puff into the lungs 2 (two) times daily.  09/28/18  Yes Birdie Sons, MD  memantine (NAMENDA) 5 MG tablet Take 5 mg by mouth 2 (two) times daily.    Yes [provider]  mirtazapine (REMERON SOL-TAB) 15 MG disintegrating tablet Take 1 tablet (15 mg total) by mouth at bedtime. 01/11/19  Yes Birdie Sons, MD  ondansetron (ZOFRAN) 4 MG tablet Take 1 tablet (4 mg total) by mouth as needed. Patient taking differently: Take 4 mg by mouth as needed for nausea or vomiting.  02/12/19  Yes Birdie Sons, MD  pantoprazole (PROTONIX) 40 MG tablet Take 40-80 mg by mouth See admin instructions. Take 1 tablet (40 mg) by mouth every morning and take 1 additional tablet ('40mg'$ ) every evening as needed for heartburn   Yes [provider]  potassium chloride (K-DUR,KLOR-CON) 10 MEQ tablet Take 10 mEq by mouth daily. 12/31/18  Yes [provider]  pregabalin (LYRICA) 300 MG capsule Take 300 mg by mouth 2 (two) times daily.   Yes [provider]  silver sulfADIAZINE (SILVADENE) 1 % cream Apply 1 application topically daily as needed (leg infection).    Yes [provider]  triamcinolone (KENALOG) 0.025 % ointment Apply 1 application topically 2 (two) times daily. Patient taking differently: Apply 1 application topically 2 (two) times daily as needed (cracking skin).  06/15/18  Yes Birdie Sons, MD  VENTOLIN HFA 108 [phone] Base) MCG/ACT inhaler Inhale 2 puffs into the lungs every 4 (four) hours as needed for wheezing or shortness of breath. Patient taking differently: Inhale 2 puffs into the lungs every 4 (four) hours as needed for wheezing or  shortness of breath.  08/21/18  Yes Birdie Sons, MD  vitamin B-12 (CYANOCOBALAMIN) 1000 MCG tablet Take 1 tablet (1,000 mcg total) by mouth daily. 05/21/18  Yes Loletha Grayer, MD  fentaNYL (DURAGESIC) 50 MCG/HR Place 1 patch onto the  skin every 3 (three) days for 30 days. 05/01/19 05/31/19  Vevelyn Francois, NP  fentaNYL (DURAGESIC) 50 MCG/HR Place 1 patch onto the skin every 3 (three) days for 30 days. Patient not taking: Reported on 02/12/2019 04/01/19 05/01/19  Vevelyn Francois, NP  fentaNYL (DURAGESIC) 50 MCG/HR Place 1 patch onto the skin every 3 (three) days for 30 days. Patient not taking: Reported on 02/12/2019 03/02/19 04/01/19  Vevelyn Francois, NP  [facility] 4 MG/0.1ML LIQD nasal spray kit Place 0.4 mg into the nose daily as needed (for accidental overdose.).  09/11/17   [provider]  tiotropium (SPIRIVA HANDIHALER) 18 MCG inhalation capsule inhale the contents of one capsule in the handihaler once daily 01/25/19   Birdie Sons, MD      PHYSICAL EXAMINATION:   VITAL SIGNS: Blood pressure (!) 120/53, pulse (!) 104, temperature (!) 101.3 F (38.5 C), temperature source Oral, resp. rate 16, height '4\' 10"'$  (1.473 m), weight 65.8 kg, SpO2 100 %.  GENERAL:  68 y.o.-year-old patient lying in the bed with no acute distress.  EYES: Pupils equal, round, reactive to light and accommodation. No scleral icterus. Extraocular muscles intact.  HEENT: Head atraumatic, normocephalic. Oropharynx and nasopharynx clear.  NECK:  Supple, no jugular venous distention. No thyroid enlargement, no tenderness.  LUNGS: Decreased breath sounds bilaterally, rales heard in left lung. No use of accessory muscles of respiration.  CARDIOVASCULAR: S1, S2 tachycardia noted. No murmurs, rubs, or gallops.  ABDOMEN: Soft, nontender, nondistended. Bowel sounds present. No organomegaly or mass.  EXTREMITIES: No pedal edema, cyanosis, or clubbing.  NEUROLOGIC: Cranial nerves II through XII are intact. Muscle strength 5/5 in  all extremities. Sensation intact. Gait not checked.  PSYCHIATRIC: The patient is alert and oriented x 3.  SKIN: No obvious rash, lesion, or ulcer.   LABORATORY PANEL:   CBC Recent Labs  Lab 02/27/19 1404  WBC 11.9*  HGB 10.9*  HCT 35.0*  PLT 232  MCV 99.2  MCH 30.9  MCHC 31.1  RDW 14.9  LYMPHSABS 1.2  MONOABS 0.8  EOSABS 0.1  BASOSABS 0.1   ------------------------------------------------------------------------------------------------------------------  Chemistries  Recent Labs  Lab 02/27/19 1404  NA 141  K 3.9  CL 104  CO2 28  GLUCOSE 96  BUN 14  CREATININE 0.79  CALCIUM 8.4*  AST 15  ALT 9  ALKPHOS 66  BILITOT 0.9   ------------------------------------------------------------------------------------------------------------------ estimated creatinine clearance is 52.6 mL/min (by C-G formula based on SCr of 0.79 mg/dL). ------------------------------------------------------------------------------------------------------------------ No results for input(s): TSH, T4TOTAL, T3FREE, THYROIDAB in the last 72 hours.  Invalid input(s): FREET3   Coagulation profile Recent Labs  Lab 02/27/19 1404  INR 1.0   ------------------------------------------------------------------------------------------------------------------- No results for input(s): DDIMER in the last 72 hours. -------------------------------------------------------------------------------------------------------------------  Cardiac Enzymes No results for input(s): CKMB, TROPONINI, MYOGLOBIN in the last 168 hours.  Invalid input(s): CK ------------------------------------------------------------------------------------------------------------------ Invalid input(s): POCBNP  ---------------------------------------------------------------------------------------------------------------  Urinalysis    Component Value Date/Time   COLORURINE YELLOW (A) 02/27/2019 1438   APPEARANCEUR CLEAR (A)  02/27/2019 1438   APPEARANCEUR Clear 08/19/2014 2200   LABSPEC 1.012 02/27/2019 1438   LABSPEC 1.025 08/19/2014 2200   PHURINE 6.0 02/27/2019 1438   GLUCOSEU NEGATIVE 02/27/2019 1438   GLUCOSEU Negative 08/19/2014 2200   HGBUR NEGATIVE 02/27/2019 1438   BILIRUBINUR NEGATIVE 02/27/2019 1438   BILIRUBINUR Neg 05/25/2018 1638   BILIRUBINUR Negative 08/19/2014 2200   KETONESUR NEGATIVE 02/27/2019 1438   PROTEINUR NEGATIVE 02/27/2019 1438   UROBILINOGEN 0.2 05/25/2018 1638   NITRITE  NEGATIVE 02/27/2019 1438   LEUKOCYTESUR NEGATIVE 02/27/2019 1438   LEUKOCYTESUR Negative 08/19/2014 2200     RADIOLOGY: Dg Chest Portable 1 View  Result Date: 02/27/2019 CLINICAL DATA:  Cough, fever and weakness. Recent pneumonia. Current smoker. EXAM: PORTABLE CHEST 1 VIEW COMPARISON:  02/01/2019, 01/18/2019 and 12/15/2018 FINDINGS: Patient is rotated to the right. Lungs are adequately inflated demonstrate persistent mild opacification over the left base with interval improvement. No definite effusion. Minimal linear density right base likely atelectasis. Cardiomediastinal silhouette and remainder of the exam is unchanged. IMPRESSION: Persistent left base opacification with interval improvement suggesting continued improving infection. Minimal linear atelectasis right base. Electronically Signed   By: Marin Olp M.D.   On: 02/27/2019 14:49    EKG: Orders placed or performed during the hospital encounter of 02/27/19  . EKG 12-Lead  . EKG 12-Lead  . ED EKG 12-Lead  . ED EKG 12-Lead    IMPRESSION AND PLAN: 68 year old female patient with a known history of pneumonia, aspiration pneumonitis, GERD, hyperlipidemia, hypertension, dependent on chronic oxygen, hyperlipidemia, hypertension, COPD presented to the emergency room for fever and cough.   -Sepsis Admit patient to medical floor Start patient on IV vancomycin and Unasyn antibiotics Follow-up cultures and lactic acid levels and procalcitonin IV  fluids  -Pneumonia Healthcare versus aspiration Currently started patient on vancomycin and Unasyn antibiotics Flu test pending COVID-19 test has been sent Droplet and contact precautions Infectious disease consult Infection prevention team notified Follow-up test results  -Chronic respiratory failure Continue oxygen via nasal cannula at 3 L baseline  -DVT prophylaxis with subcu Lovenox daily  All the records are reviewed and case discussed with ED provider. Management plans discussed with the patient, family and they are in agreement.  CODE STATUS:Full code Code Status History    Date Active Date Inactive Code Status Order ID Comments User Context   02/01/2019 1826 02/03/2019 1417 Full Code [id number]  Demetrios Loll, MD Inpatient   01/15/2019 1453 01/20/2019 1852 Full Code [id number]  Bettey Costa, MD ED   01/01/2019 1733 01/03/2019 1525 Full Code [id number]  Vaughan Basta, MD Inpatient   12/21/2018 1827 12/22/2018 1644 Full Code [id number]  Fritzi Mandes, MD ED   11/03/2018 0756 11/08/2018 1557 Full Code [id number]  Max Sane, MD ED   06/18/2018 1428 06/22/2018 1454 Full Code [id number]  Loletha Grayer, MD ED   06/01/2018 1535 06/05/2018 2007 Full Code [id number]  Vaughan Basta, MD Inpatient   05/14/2018 1351 05/21/2018 1558 Partial Code [id number]  Saundra Shelling, MD ED   02/08/2018 [place] 02/09/2018 1920 Full Code [id number]  Vaughan Basta, MD Inpatient   02/05/2018 2025 02/07/2018 2119 Full Code [id number]  Hillary Bow, MD ED   04/29/2017 1314 05/02/2017 2148 Full Code [id number]  Epifanio Lesches, MD ED   12/23/2016 1745 12/25/2016 1849 Full Code [id number]  Theodoro Grist, MD ED   11/06/2016 0900 11/07/2016 1853 Full Code [id number]  Max Sane, MD Inpatient   11/05/2016 1401 11/06/2016 0900 Partial Code [id number]  Bettey Costa, MD Inpatient   06/03/2016 0253 06/05/2016 1945 Full Code [id number]  Quintella Baton, MD Inpatient   11/19/2015 1218 11/24/2015 1624 Full Code [id number]  Demetrios Loll, MD Inpatient   11/09/2015 2302 11/13/2015 1851 Full Code [id number]  Lytle Butte, MD ED   10/17/2015 2226 10/22/2015 1303 Full Code [id number]  Fritzi Mandes, MD Inpatient   10/09/2015 1738 10/12/2015 1934 Full Code [id number]  Demetrios Loll, MD Inpatient   08/01/2015 0008 08/03/2015 1454 Full Code [id number]  Lance Coon, MD Inpatient  Advance Directive Documentation     Most Recent Value  Type of Advance Directive  Living will  Pre-existing out of facility DNR order (yellow form or pink MOST form)  -  "MOST" Form in Place?  -       TOTAL TIME TAKING CARE OF THIS PATIENT: 53 minutes.    Saundra Shelling M.D on 02/27/2019 at 3:59 PM  Between 7am to 6pm - Pager - [phone]  After 6pm go to [email or web address] - password [email or web address] [place] [facility]  Office  [phone]  CC: Primary care physician; Birdie Sons, MD

## 2019-02-27 NOTE — ED Notes (Signed)
One set of cultures drawn by Carollee Herter RN at this time from [facility]

## 2019-02-27 NOTE — ED Notes (Signed)
ED TO INPATIENT HANDOFF REPORT  ED Nurse Name and Phone #: Erskine Squibb [phone]  S Name/Age/Gender Cheryl [name] 68 y.o. female Room/Bed: ED11A/ED11A  Code Status   Code Status: Prior  Home/SNF/Other Home Patient oriented to: self, place, time, situation Is this baseline? Yes   Triage Complete: Triage complete  Chief Complaint Fever/coughing  Triage Note Pt arrives POV with husband who states poss aspiration pneumonia. registereted and taken back outside to room 11. Mask in place. Pt very weak. This RN had to lift pt from wheelchair to bed. Pt confused to place, situation. Initially states she doesn't know who brought her to ED. Pt then states name and DOB and year correctly and states husband brought her here. Pt wears 3 L [place] at baseline per pt.    Allergies Allergies  Allergen Reactions  . Percocet [Oxycodone-Acetaminophen] Hives and Rash  . Aspirin Nausea And Vomiting and Other (See Comments)  . Codeine Nausea And Vomiting, Nausea Only and Other (See Comments)  . Propoxyphene Nausea Only and Other (See Comments)  . Sulfa Antibiotics Rash and Other (See Comments)    Level of Care/Admitting Diagnosis ED Disposition    ED Disposition Condition Comment   Admit  Hospital Area: [facility] [[id number]]  Level of Care: Med-Surg [16]  Diagnosis: Sepsis [facility]) [[id number]]  Admitting Physician: Ihor Austin [[id number]]  Attending Physician: Ihor Austin [[id number]]  Estimated length of stay: past midnight tomorrow  Certification:: I certify this patient will need inpatient services for at least 2 midnights  PT Class (Do Not Modify): Inpatient [101]  PT Acc Code (Do Not Modify): Private [1]       B Medical/Surgery History Past Medical History:  Diagnosis Date  . Acute encephalopathy 06/18/2018  . Acute postoperative pain 04/14/2017  . Allergy   . Anemia   . Aspiration pneumonitis (HCC) 11/24/2015  . Asthma   . Chronic pain   . DVT (deep venous thrombosis) (HCC)    . Dysrhythmia   . Encephalopathy    MULTIPLE TIMES  . GERD (gastroesophageal reflux disease)   . Hyperlipidemia   . Hypertension   . Kyphoscoliosis deformity of spine   . Metabolic encephalopathy 12/23/2016  . Migraines   . Neuropathy 2010  . NSTEMI (non-ST elevated myocardial infarction) (HCC)   . Oxygen deficiency   . Pneumonia   . Pneumonia 11/19/2015  . Pneumonia 10/2016  . Pneumonia    aspiration 2018- 4 times in last year  . Rib fracture 09/19/2017   Displace right 9th rib fracture.   . Wears dentures    full upper and lower   Past Surgical History:  Procedure Laterality Date  . ABDOMINAL HYSTERECTOMY  1975   Bilaterl Oophorectomy; [place] to IUD infection  . abdomnal aortic stent  05/30/2008   Dr. Nanetta Batty  . APPENDECTOMY    . APPENDECTOMY    . BACK SURGERY    . cardiac catherization  10/31/2009  . CARDIAC CATHETERIZATION    . CATARACT EXTRACTION W/PHACO Left 08/20/2018   Procedure: CATARACT EXTRACTION PHACO AND INTRAOCULAR LENS PLACEMENT (IOC);  Surgeon: Nevada Crane, MD;  Location: [facility] ORS;  Service: Ophthalmology;  Laterality: Left;  [place] 00:45.1 AP% 12.3 CDE 5.54 FLUID PACK LOT # [id number]  . CATARACT EXTRACTION W/PHACO Right 09/23/2018   Procedure: CATARACT EXTRACTION PHACO AND INTRAOCULAR LENS PLACEMENT (IOC);  Surgeon: Nevada Crane, MD;  Location: [facility] ORS;  Service: Ophthalmology;  Laterality: Right;  [place] 00:45.0 CDE 5.19 Fluid Pack lot # [id number]  .  CERVICAL FUSION  C5 - 6/C6-7  . CHOLECYSTECTOMY  1972  . COLONOSCOPY WITH PROPOFOL N/A 07/27/2015   Procedure: COLONOSCOPY WITH PROPOFOL;  Surgeon: Wallace Cullens, MD;  Location: [facility] ENDOSCOPY;  Service: Gastroenterology;  Laterality: N/A;  . ESOPHAGOGASTRODUODENOSCOPY (EGD) WITH PROPOFOL N/A 07/27/2015   Procedure: ESOPHAGOGASTRODUODENOSCOPY (EGD) WITH PROPOFOL;  Surgeon: Wallace Cullens, MD;  Location: [facility] ENDOSCOPY;  Service: Gastroenterology;  Laterality: N/A;  . ESOPHAGOGASTRODUODENOSCOPY (EGD) WITH  PROPOFOL N/A 05/19/2018   Procedure: ESOPHAGOGASTRODUODENOSCOPY (EGD) WITH PROPOFOL;  Surgeon: Midge Minium, MD;  Location: [facility] ENDOSCOPY;  Service: Endoscopy;  Laterality: N/A;  . EYE SURGERY    . FOOT SURGERY Bilateral    5-6 years per patient  . SPINE SURGERY    . TONSILLECTOMY    . VASCULAR SURGERY     LEG STENTS     A IV Location/Drains/Wounds Patient Lines/Drains/Airways Status   Active Line/Drains/Airways    Name:   Placement date:   Placement time:   Site:   Days:   Peripheral IV 02/27/19 Right Antecubital   02/27/19    1405    Antecubital   less than 1          Intake/Output Last 24 hours  Intake/Output Summary (Last 24 hours) at 02/27/2019 1531 Last data filed at 02/27/2019 1529 Gross per 24 hour  Intake 100 ml  Output -  Net 100 ml    Labs/Imaging Results for orders placed or performed during the hospital encounter of 02/27/19 (from the past 48 hour(s))  Comprehensive metabolic panel     Status: Abnormal   Collection Time: 02/27/19  2:04 PM  Result Value Ref Range   Sodium 141 135 - 145 mmol/L   Potassium 3.9 3.5 - 5.1 mmol/L   Chloride 104 98 - 111 mmol/L   CO2 28 22 - 32 mmol/L   Glucose, Bld 96 70 - 99 mg/dL   BUN 14 8 - 23 mg/dL   Creatinine, Ser 4.09 0.44 - 1.00 mg/dL   Calcium 8.4 (L) 8.9 - 10.3 mg/dL   Total Protein 6.6 6.5 - 8.1 g/dL   Albumin 3.6 3.5 - 5.0 g/dL   AST 15 15 - 41 U/L   ALT 9 0 - 44 U/L   Alkaline Phosphatase 66 38 - 126 U/L   Total Bilirubin 0.9 0.3 - 1.2 mg/dL   GFR calc non Af Amer >60 >60 mL/min   GFR calc Af Amer >60 >60 mL/min   Anion gap 9 5 - 15    Comment: Performed at [facility], [address] [place], [place] [address]  Lactic acid, plasma     Status: None   Collection Time: 02/27/19  2:04 PM  Result Value Ref Range   Lactic Acid, Venous 1.0 0.5 - 1.9 mmol/L    Comment: Performed at [facility], [address] [place], [place] [address]  CBC with Differential     Status: Abnormal    Collection Time: 02/27/19  2:04 PM  Result Value Ref Range   WBC 11.9 (H) 4.0 - 10.5 K/uL   RBC 3.53 (L) 3.87 - 5.11 MIL/uL   Hemoglobin 10.9 (L) 12.0 - 15.0 g/dL   HCT 56.2 (L) 13.0 - 86.5 %   MCV 99.2 80.0 - 100.0 fL   MCH 30.9 26.0 - 34.0 pg   MCHC 31.1 30.0 - 36.0 g/dL   RDW 78.4 69.6 - 29.5 %   Platelets 232 150 - 400 K/uL   nRBC 0.0 0.0 - 0.2 %  Neutrophils Relative % 81 %   Neutro Abs 9.7 (H) 1.7 - 7.7 K/uL   Lymphocytes Relative 10 %   Lymphs Abs 1.2 0.7 - 4.0 K/uL   Monocytes Relative 7 %   Monocytes Absolute 0.8 0.1 - 1.0 K/uL   Eosinophils Relative 1 %   Eosinophils Absolute 0.1 0.0 - 0.5 K/uL   Basophils Relative 1 %   Basophils Absolute 0.1 0.0 - 0.1 K/uL   Immature Granulocytes 0 %   Abs Immature Granulocytes 0.05 0.00 - 0.07 K/uL    Comment: Performed at [facility], [address] [place], [place] [address]  Protime-INR     Status: None   Collection Time: 02/27/19  2:04 PM  Result Value Ref Range   Prothrombin Time 13.3 11.4 - 15.2 seconds   INR 1.0 0.8 - 1.2    Comment: (NOTE) INR goal varies based on device and disease states. Performed at [facility], [address] [place], [place] [address]   Influenza panel by PCR (type A & B)     Status: None   Collection Time: 02/27/19  2:29 PM  Result Value Ref Range   Influenza A By PCR NEGATIVE NEGATIVE   Influenza B By PCR NEGATIVE NEGATIVE    Comment: (NOTE) The Xpert Xpress Flu assay is intended as an aid in the diagnosis of  influenza and should not be used as a sole basis for treatment.  This  assay is FDA approved for nasopharyngeal swab specimens only. Nasal  washings and aspirates are unacceptable for Xpert Xpress Flu testing. Performed at [facility], [address] [place], [place] [address]   Urinalysis, Complete w Microscopic     Status: Abnormal   Collection Time: 02/27/19  2:38 PM  Result Value Ref Range   Color, Urine YELLOW (A) YELLOW   APPearance CLEAR  (A) CLEAR   Specific Gravity, Urine 1.012 1.005 - 1.030   pH 6.0 5.0 - 8.0   Glucose, UA NEGATIVE NEGATIVE mg/dL   Hgb urine dipstick NEGATIVE NEGATIVE   Bilirubin Urine NEGATIVE NEGATIVE   Ketones, ur NEGATIVE NEGATIVE mg/dL   Protein, ur NEGATIVE NEGATIVE mg/dL   Nitrite NEGATIVE NEGATIVE   Leukocytes,Ua NEGATIVE NEGATIVE   RBC / HPF 0-5 0 - 5 RBC/hpf   WBC, UA 0-5 0 - 5 WBC/hpf   Bacteria, UA NONE SEEN NONE SEEN   Squamous Epithelial / LPF 0-5 0 - 5   Mucus PRESENT    Hyaline Casts, UA PRESENT     Comment: Performed at [facility], [address], [place], [place] [address]   Dg Chest Portable 1 View  Result Date: 02/27/2019 CLINICAL DATA:  Cough, fever and weakness. Recent pneumonia. Current smoker. EXAM: PORTABLE CHEST 1 VIEW COMPARISON:  02/01/2019, 01/18/2019 and 12/15/2018 FINDINGS: Patient is rotated to the right. Lungs are adequately inflated demonstrate persistent mild opacification over the left base with interval improvement. No definite effusion. Minimal linear density right base likely atelectasis. Cardiomediastinal silhouette and remainder of the exam is unchanged. IMPRESSION: Persistent left base opacification with interval improvement suggesting continued improving infection. Minimal linear atelectasis right base. Electronically Signed   By: Elberta Fortis M.D.   On: 02/27/2019 14:49    Pending Labs Unresulted Labs (From admission, onward)    Start     Ordered   02/27/19 1526  Procalcitonin  ONCE - STAT,   STAT     02/27/19 1525   02/27/19 1415  Novel Coronavirus, NAA (hospital order;  send-out to ref lab)  (Novel Coronavirus, NAA [facility] Order; send-out to ref lab) with precautions panel)  ONCE - STAT,   STAT    Question Answer Comment  Current symptoms Fever and Cough   Excluded other viral illnesses Yes   Exposure Risk None   Patient immune status Normal      02/27/19 1414   02/27/19 1403  Lactic acid, plasma  Now then every 2 hours,   STAT      02/27/19 1402   02/27/19 1403  Culture, blood (Routine x 2)  BLOOD CULTURE X 2,   STAT     02/27/19 1402   Signed and Held  CBC  (enoxaparin (LOVENOX)    CrCl >/= 30 ml/min)  Once,   R    Comments:  Baseline for enoxaparin therapy IF NOT ALREADY DRAWN.  Notify MD if PLT < 100 K.    Signed and Held   Signed and Held  Creatinine, serum  (enoxaparin (LOVENOX)    CrCl >/= 30 ml/min)  Once,   R    Comments:  Baseline for enoxaparin therapy IF NOT ALREADY DRAWN.    Signed and Held   Signed and Held  Creatinine, serum  (enoxaparin (LOVENOX)    CrCl >/= 30 ml/min)  Weekly,   R    Comments:  while on enoxaparin therapy    Signed and Held   Signed and Held  Basic metabolic panel  Tomorrow morning,   R     Signed and Held   Signed and Held  CBC  Tomorrow morning,   R     Signed and Held          Vitals/Pain Today's Vitals   02/27/19 1401 02/27/19 1501 02/27/19 1501  BP: (!) 125/55  (!) 120/53  Pulse: (!) 115  (!) 104  Resp: 18  16  Temp: (!) 101.3 F (38.5 C)    TempSrc: Oral    SpO2: 99%  100%  Weight: 65.8 kg    Height:  (1.473 m)    PainSc: 0-No pain 0-No pain     Isolation Precautions Contact precautions  Medications Medications  vancomycin (VANCOCIN) IVPB 1000 mg/200 mL premix (1,000 mg Intravenous New Bag/Given 02/27/19 1501)  nicotine (NICODERM CQ - dosed in mg/24 hours) patch 21 mg (has no administration in time range)  acetaminophen (TYLENOL) suppository 650 mg (650 mg Rectal Given 02/27/19 1425)  sodium chloride 0.9 % bolus 1,000 mL (1,000 mLs Intravenous New Bag/Given 02/27/19 1425)  Ampicillin-Sulbactam (UNASYN) 3 g in sodium chloride 0.9 % 100 mL IVPB (0 g Intravenous Stopped 02/27/19 1529)    Mobility walks High fall risk   Focused Assessments Pulmonary Assessment Handoff:  Lung sounds: Bilateral Breath Sounds: Diminished L Breath Sounds: Diminished R Breath Sounds: Diminished O2 Device: Nasal Cannula O2 Flow Rate (L/min): 3  L/min      R Recommendations: See Admitting Provider Note  Report given to:   Additional Notes:

## 2019-02-27 NOTE — ED Notes (Signed)
Resting.  Easy to wake.  AAOx3.  Skin warm and dry. NAD

## 2019-02-27 NOTE — ED Notes (Signed)
Three attempts for second line have failed. First set of blood cultures sent to lab

## 2019-02-27 NOTE — ED Notes (Signed)
Attempted to speak with floor nurse re: questions r/t patient report.

## 2019-02-27 NOTE — Progress Notes (Signed)
Advanced care plan.  Purpose of the Encounter: CODE STATUS  Parties in Attendance: Patient  Patient's Decision Capacity: Good  Subjective/Patient's story:  Cheryl [name]  is a 68 y.o. female with a known history of pneumonia, aspiration pneumonitis, GERD, hyperlipidemia, hypertension, dependent on chronic oxygen, hyperlipidemia, hypertension, COPD presented to the emergency room for fever and cough.  Cough is productive of phlegm.  Patient was recently treated for pneumonia and discharged from our hospital.   Objective/Medical story Patient was worked up in the emergency room with chest x-ray which revealed pneumonia.  Needs IV antibiotics and IV fluids.  Needs testing for novel coronavirus as well as flu. Goals of care determination:  Advance care directives goals of care treatment plan discussed for now.  Patient wants everything done which includes CPR, intubation ventilator if the need arises CODE STATUS: Full code Time spent discussing advanced care planning: 16 minutes

## 2019-02-27 NOTE — ED Provider Notes (Signed)
[facility] Emergency Department Provider Note  ____________________________________________  Time seen: Approximately 3:11 PM  I have reviewed the triage vital signs and the nursing notes.   HISTORY  Chief Complaint Fever and Cough  Level 5 caveat:  Portions of the history and physical were unable to be obtained due to AMS   HPI Cheryl [name] is a 68 y.o. female with history of COPD on 3 L nasal cannula, hypertension, hyperlipidemia, recent admission for pneumonia who presents for evaluation of fever, cough and confusion.  According to the husband patient has been choking at home and he was concerned that she might have aspirated.  Today she was complaining of worsening shortness of breath, she was very confused.  No vomiting or diarrhea per husband.  She has had a fever at home of 103F.  No known exposure or travel to endemic regions of Covid 19.  Past Medical History:  Diagnosis Date   Acute encephalopathy 06/18/2018   Acute postoperative pain 04/14/2017   Allergy    Anemia    Aspiration pneumonitis ([place]) 11/24/2015   Asthma    Chronic pain    DVT (deep venous thrombosis) (HCC)    Dysrhythmia    Encephalopathy    MULTIPLE TIMES   GERD (gastroesophageal reflux disease)    Hyperlipidemia    Hypertension    Kyphoscoliosis deformity of spine    Metabolic encephalopathy 2/67/1245   Migraines    Neuropathy 2010   NSTEMI (non-ST elevated myocardial infarction) ([place])    Oxygen deficiency    Pneumonia    Pneumonia 11/19/2015   Pneumonia 10/2016   Pneumonia    aspiration 2018- 4 times in last year   Rib fracture 09/19/2017   Displace right 9th rib fracture.    Wears dentures    full upper and lower    Patient Active Problem List   Diagnosis Date Noted   Diastolic dysfunction 80/99/8338   Tobacco abuse 02/12/2019   Aspiration pneumonia ([place]) 01/01/2019   Hiatal hernia 12/24/2018   Chronic lower extremity pain  (Primary Area of Pain) (Bilateral) (L>R) 09/03/2018   Chronic hip pain (Fourth Area of Pain) (Bilateral) (L>R) 09/03/2018   Osteoarthritis of hip (Bilateral) 09/03/2018   Chronic sacroiliac joint pain (Tertiary Area of Pain) (Bilateral) (L>R) 09/03/2018   Sacroiliac joint dysfunction (Bilateral) 09/03/2018   Spondylosis without myelopathy or radiculopathy, lumbosacral region 09/03/2018   Other specified dorsopathies, sacral and sacrococcygeal region 09/03/2018   DDD (degenerative disc disease), lumbar 09/02/2018   Abnormal CT scan, lumbar spine (08/04/2017) 09/02/2018   Levoscoliosis (Severe) 09/02/2018   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, 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 09/02/2018   Lumbar Grade 1 Anterolisthesis of L3 over L4 and L4 over L5 09/02/2018   Lumbar foraminal stenosis (Bilateral: L3-4, L4-5) (Severe Right L4-5) 09/02/2018   Lumbar facet arthropathy (Bilateral) 09/02/2018   Lumbar facet osteoarthritis (Bilateral) 09/02/2018   Lumbar spondylosis 09/02/2018   Cervical spondylosis 09/02/2018   Thoracic spondylosis 09/02/2018   DDD (degenerative disc disease), thoracic 09/02/2018   DDD (degenerative disc disease), cervical 09/02/2018   Spondylosis without myelopathy or radiculopathy, lumbar region 09/01/2018   Anorexia 07/07/2018   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   Goals of care, counseling/discussion    Palliative care by specialist    Iron deficiency anemia 06/09/2018   Gastritis without bleeding    Hypoxia 05/14/2018   Generalized weakness 02/08/2018   Marijuana use 06/05/2017   Hypokalemia 12/23/2016   Protein-calorie malnutrition, severe 11/06/2016  Nausea with vomiting    Lumbar facet syndrome (Bilateral) (L>R) 03/20/2016   Opiate use (160 MME/Day) 02/28/2016   Anemia 02/28/2016   Osteoarthrosis 12/18/2015   Long term current use of anticoagulant therapy (Plavix) 12/18/2015   Long term current use of opiate  analgesic 12/04/2015   Fibromyalgia 12/04/2015   Dysphagia 11/24/2015   Abnormal mammogram of right breast 10/11/2015   Dilated intrahepatic bile duct 10/11/2015   Chronic lumbar radicular pain (Bilateral) (L>R) (L5) 09/04/2015   Chronic low back pain (Secondary Area of Pain) (Bilateral) (L>R) 09/04/2015   Encounter for therapeutic drug level monitoring 09/04/2015   Chronic pain syndrome 09/04/2015   Platelet inhibition due to Plavix 09/04/2015   COPD with acute exacerbation ([place]) 07/31/2015   Airway hyperreactivity 07/03/2015   Chronic thoracic back pain 07/03/2015   Excessive falling 07/03/2015   Alteration in bowel elimination: incontinence 07/03/2015   Insomnia 07/03/2015   Decreased testosterone level 07/03/2015   Leg weakness 07/03/2015   Menopausal symptom 07/03/2015   Migraine 07/03/2015   Neuropathy ([place]) 07/03/2015   Fecal occult blood test positive 07/03/2015   OP (osteoporosis) 07/03/2015   Panic disorder 07/03/2015   Shortness of breath 07/03/2015   Compulsive tobacco user syndrome 07/03/2015   Urinary incontinence 07/03/2015   Essential hypertension 06/06/2015   GERD (gastroesophageal reflux disease) 06/06/2015   Hyperlipemia 06/06/2015   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 Quay Burow   APPENDECTOMY     APPENDECTOMY     BACK SURGERY     cardiac catherization  10/31/2009   CARDIAC CATHETERIZATION     CATARACT EXTRACTION W/PHACO Left 08/20/2018   Procedure: CATARACT EXTRACTION PHACO AND INTRAOCULAR LENS PLACEMENT ([place]);  Surgeon: Eulogio Bear, MD;  Location: [facility] ORS;  Service: Ophthalmology;   Laterality: Left;  [place] 00:45.1 AP% 12.3 CDE 5.54 FLUID PACK LOT # [id number]   CATARACT EXTRACTION W/PHACO Right 09/23/2018   Procedure: CATARACT EXTRACTION PHACO AND INTRAOCULAR LENS PLACEMENT (IOC);  Surgeon: Eulogio Bear, MD;  Location: [facility] ORS;  Service: Ophthalmology;  Laterality: Right;  [place] 00:45.0 CDE 5.19 Fluid Pack lot # [id number]   CERVICAL FUSION  C5 - 6/C6-7   CHOLECYSTECTOMY  1972   COLONOSCOPY WITH PROPOFOL N/A 07/27/2015   Procedure: COLONOSCOPY WITH PROPOFOL;  Surgeon: Hulen Luster, MD;  Location: [facility] ENDOSCOPY;  Service: Gastroenterology;  Laterality: N/A;   ESOPHAGOGASTRODUODENOSCOPY (EGD) WITH PROPOFOL N/A 07/27/2015   Procedure: ESOPHAGOGASTRODUODENOSCOPY (EGD) WITH PROPOFOL;  Surgeon: Hulen Luster, MD;  Location: [facility] ENDOSCOPY;  Service: Gastroenterology;  Laterality: N/A;   ESOPHAGOGASTRODUODENOSCOPY (EGD) WITH PROPOFOL N/A 05/19/2018   Procedure: ESOPHAGOGASTRODUODENOSCOPY (EGD) WITH PROPOFOL;  Surgeon: Lucilla Lame, MD;  Location: [facility] ENDOSCOPY;  Service: Endoscopy;  Laterality: N/A;   EYE SURGERY     FOOT SURGERY Bilateral    5-6 years per patient   SPINE SURGERY     TONSILLECTOMY     VASCULAR SURGERY     LEG STENTS    Prior to Admission medications   Medication Sig Start Date End Date Taking? Authorizing Provider  acetaminophen (TYLENOL) 325 MG tablet Take 2 tablets (650 mg total) by mouth every 6 (six) hours as needed for mild pain (or  Fever >/= 101). 05/21/18   Loletha Grayer, MD  albuterol (PROVENTIL) (2.5 MG/3ML) 0.083% nebulizer solution Take 3 mLs (2.5 mg total) by nebulization every 4 (four) hours as needed for wheezing. 02/10/19   Birdie Sons, MD  alendronate (FOSAMAX) 70 MG tablet Take 1 tablet (70 mg total) by mouth once a week. Take with a full glass of water on an empty stomach. 12/04/18   Birdie Sons, MD  ALPRAZolam Duanne Moron) 0.5 MG tablet Take 0.5-1 tablets (0.25-0.5 mg total) by mouth at bedtime as needed for anxiety. 02/02/19    Birdie Sons, MD  atorvastatin (LIPITOR) 40 MG tablet Take 1 tablet (40 mg total) by mouth daily at 6 PM. 02/09/19   Fisher, Kirstie Peri, MD  baclofen (LIORESAL) 10 MG tablet Take 1 tablet (10 mg total) by mouth 3 (three) times daily as needed for muscle spasms. 08/17/18   Triplett, Cari B, FNP  BELSOMRA 5 MG TABS Take 1 tablet by mouth at bedtime as needed (for insomnia). Patient taking differently: Take 5 mg by mouth at bedtime.  11/16/18   Birdie Sons, MD  benzonatate (TESSALON PERLES) 100 MG capsule Take 1 capsule (100 mg total) by mouth 3 (three) times daily as needed for cough. 02/03/19   Demetrios Loll, MD  buPROPion [facility] SR) 150 MG 12 hr tablet 1 tablet daily for 3 days, then 1 tablet twice daily. Stop smoking 14 days after starting medication 02/12/19 06/12/19  Birdie Sons, MD  celecoxib (CELEBREX) 100 MG capsule Take 100 mg by mouth 2 (two) times daily as needed. 11/23/18   [provider]  cephALEXin (KEFLEX) 500 MG capsule Take 1 capsule (500 mg total) by mouth 4 (four) times daily for 10 days. 02/23/19 03/05/19  Birdie Sons, MD  clopidogrel (PLAVIX) 75 MG tablet Take 75 mg by mouth daily.    [provider]  diphenoxylate-atropine (LOMOTIL) 2.5-0.025 MG tablet Take 1 tablet by mouth as needed for diarrhea or loose stools. 06/10/18   [provider]  DULoxetine (CYMBALTA) 30 MG capsule Take 30 mg by mouth daily. 01/12/19   [provider]  estradiol (ESTRACE) 0.5 MG tablet Take 1 tablet (0.5 mg total) by mouth every other day. 09/09/18   Birdie Sons, MD  fentaNYL (DURAGESIC) 50 MCG/HR Place 1 patch onto the skin every 3 (three) days for 30 days. 05/01/19 05/31/19  Vevelyn Francois, NP  fentaNYL (DURAGESIC) 50 MCG/HR Place 1 patch onto the skin every 3 (three) days for 30 days. Patient not taking: Reported on 02/12/2019 04/01/19 05/01/19  Vevelyn Francois, NP  fentaNYL (DURAGESIC) 50 MCG/HR Place 1 patch onto the skin every 3 (three) days for 30  days. Patient not taking: Reported on 02/12/2019 03/02/19 04/01/19  Vevelyn Francois, NP  ferrous sulfate 325 (65 FE) MG tablet Take 1 tablet (325 mg total) by mouth 2 (two) times daily with a meal. 01/11/19   Birdie Sons, MD  Fluticasone-Salmeterol (ADVAIR DISKUS) 250-50 MCG/DOSE AEPB TAKE ONE PUFF TWICE DAILY. 09/28/18   Birdie Sons, MD  memantine (NAMENDA) 10 MG tablet Take 10 mg by mouth 2 (two) times daily.     [provider]  mirtazapine (REMERON SOL-TAB) 15 MG disintegrating tablet Take 1 tablet (15 mg total) by mouth at bedtime. 01/11/19   Birdie Sons, MD  NARCAN 4 MG/0.1ML LIQD nasal spray kit Place 0.4 mg into the nose daily as needed (for accidental overdose.).  09/11/17   [provider]  ondansetron (ZOFRAN) 4 MG tablet Take 1 tablet (4 mg total) by mouth as needed. 02/12/19   Birdie Sons, MD  pantoprazole (PROTONIX) 40 MG tablet Take 40 mg by mouth 2 (two) times daily. Take 1 tablet (40 mg) by mouth scheduled every morning, may repeat dose in evening if needed for heartburn/indigestion.    [provider]  potassium chloride (K-DUR,KLOR-CON) 10 MEQ tablet Take 10 mEq by mouth daily. 12/31/18   [provider]  pregabalin (LYRICA) 300 MG capsule Take 300 mg by mouth 2 (two) times daily.    [provider]  silver sulfADIAZINE (SILVADENE) 1 % cream Apply 1 application topically daily as needed (leg infection).     [provider]  tiotropium (SPIRIVA HANDIHALER) 18 MCG inhalation capsule inhale the contents of one capsule in the handihaler once daily 01/25/19   Birdie Sons, MD  triamcinolone (KENALOG) 0.025 % ointment Apply 1 application topically 2 (two) times daily. Patient taking differently: Apply 1 application topically 2 (two) times daily as needed (cracking skin).  06/15/18   Birdie Sons, MD  VENTOLIN HFA 108 [phone] Base) MCG/ACT inhaler Inhale 2 puffs into the lungs every 4 (four) hours as needed for wheezing or  shortness of breath. Patient taking differently: Inhale 2 puffs into the lungs every 4 (four) hours as needed for wheezing or shortness of breath.  08/21/18   Birdie Sons, MD  vitamin B-12 (CYANOCOBALAMIN) 1000 MCG tablet Take 1 tablet (1,000 mcg total) by mouth daily. 05/21/18   Loletha Grayer, MD    Allergies Percocet [oxycodone-acetaminophen]; Aspirin; Codeine; Propoxyphene; and Sulfa antibiotics  Family History  Problem Relation Age of Onset   Cancer Mother    Arthritis Mother    Heart disease Mother    Diabetes Mother        mellitus, type 2   Heart disease Father    Diabetes Sister    Cancer Brother    Cancer Brother        lung   Diabetes Brother     Social History Social History   Tobacco Use   Smoking status: Current Every Day Smoker    Years: 50.00    Types: Cigarettes   Smokeless tobacco: Never Used   Tobacco comment: Previously smoked 2 ppd  Substance Use Topics   Alcohol use: No    Alcohol/week: 0.0 standard drinks   Drug use: No    Review of Systems  Constitutional: + fever and confusion Respiratory: Negative for shortness of breath. + cough Gastrointestinal: Negative for vomiting or diarrhea.  ____________________________________________   PHYSICAL EXAM:  VITAL SIGNS: ED Triage Vitals [02/27/19 1401]  Enc Vitals Group     BP (!) 125/55     Pulse Rate (!) 115     Resp 18     Temp (!) 101.3 F (38.5 C)     Temp Source Oral     SpO2 99 %     Weight 145 lb (65.8 kg)     Height _0  (1.473 m)     Head Circumference      Peak Flow      Pain Score 0     Pain Loc      Pain Edu?      Excl. in [place]?     Constitutional: Sleeping, easily arousable, very confused, no distress HEENT:      Head: Normocephalic and atraumatic.         Eyes: Conjunctivae are normal. Sclera  is non-icteric.       Mouth/Throat: Mucous membranes are moist.       Neck: Supple with no signs of meningismus. Cardiovascular: Tachycardic with regular  rhythm. No murmurs, gallops, or rubs. 2+ symmetrical distal pulses are present in all extremities. No JVD. Respiratory: Normal respiratory effort.  Satting well on 3 L nasal cannula  Gastrointestinal: Soft, non tender, and non distended with positive bowel sounds. No rebound or guarding. Musculoskeletal: Trace pitting edema b/l Neurologic: Face is symmetric. Moving all extremities.  Skin: Skin is warm, dry and intact. No rash noted.  ____________________________________________   LABS (all labs ordered are listed, but only abnormal results are displayed)  Labs Reviewed  COMPREHENSIVE METABOLIC PANEL - Abnormal; Notable for the following components:      Result Value   Calcium 8.4 (*)    All other components within normal limits  CBC WITH DIFFERENTIAL/PLATELET - Abnormal; Notable for the following components:   WBC 11.9 (*)    RBC 3.53 (*)    Hemoglobin 10.9 (*)    HCT 35.0 (*)    Neutro Abs 9.7 (*)    All other components within normal limits  URINALYSIS, COMPLETE (UACMP) WITH MICROSCOPIC - Abnormal; Notable for the following components:   Color, Urine YELLOW (*)    APPearance CLEAR (*)    All other components within normal limits  CULTURE, BLOOD (ROUTINE X 2)  CULTURE, BLOOD (ROUTINE X 2)  NOVEL CORONAVIRUS, NAA (HOSPITAL ORDER, SEND-OUT TO REF LAB)  LACTIC ACID, PLASMA  PROTIME-INR  LACTIC ACID, PLASMA  INFLUENZA PANEL BY PCR (TYPE A & B)   ____________________________________________  EKG  ED ECG REPORT I, Rudene Re, the attending physician, personally viewed and interpreted this ECG.  Sinus tachycardia, rate of 120, prolonged QTC, borderline right axis deviation, diffuse ST depressions with no ST elevation.  ST depressions are present on prior EKG but worse today. ____________________________________________  RADIOLOGY  I have personally reviewed the images performed during this visit and I agree with the Radiologist's read.   Interpretation by  Radiologist:  Dg Chest Portable 1 View  Result Date: 02/27/2019 CLINICAL DATA:  Cough, fever and weakness. Recent pneumonia. Current smoker. EXAM: PORTABLE CHEST 1 VIEW COMPARISON:  02/01/2019, 01/18/2019 and 12/15/2018 FINDINGS: Patient is rotated to the right. Lungs are adequately inflated demonstrate persistent mild opacification over the left base with interval improvement. No definite effusion. Minimal linear density right base likely atelectasis. Cardiomediastinal silhouette and remainder of the exam is unchanged. IMPRESSION: Persistent left base opacification with interval improvement suggesting continued improving infection. Minimal linear atelectasis right base. Electronically Signed   By: Marin Olp M.D.   On: 02/27/2019 14:49     ____________________________________________   PROCEDURES  Procedure(s) performed: None Procedures Critical Care performed: yes  CRITICAL CARE Performed by: Rudene Re  ?  Total critical care time: 35 min  Critical care time was exclusive of separately billable procedures and treating other patients.  Critical care was necessary to treat or prevent imminent or life-threatening deterioration.  Critical care was time spent personally by me on the following activities: development of treatment plan with patient and/or surrogate as well as nursing, discussions with consultants, evaluation of patient's response to treatment, examination of patient, obtaining history from patient or surrogate, ordering and performing treatments and interventions, ordering and review of laboratory studies, ordering and review of radiographic studies, pulse oximetry and re-evaluation of patient's condition.  ____________________________________________   INITIAL IMPRESSION / ASSESSMENT AND PLAN / ED COURSE  68 y.o. female with history of COPD on 3 L nasal cannula, hypertension, hyperlipidemia, recent admission for pneumonia who presents for evaluation of fever,  cough and confusion.  Patient arrives with a fever of 101F, tachycardic, normal work of breathing, satting well on her baseline 3 L nasal cannula.  Chest x-ray concerning for pneumonia.  White count slightly elevated at 11.3.  Presentation concerning for sepsis.  Based on family's history concerning for possible aspiration pneumonia.  Patient will need swallow evaluation.  Will cover with Unasyn and Vanco, will give IV fluids.  Will give rectal Tylenol.  Gentle hydration due to history of CHF with reduced EF.  Flu and Covid pending. Discussed with Dr. Estanislado Pandy for admission.      As part of my medical decision making, I reviewed the following data within the [place] History obtained from family, Nursing notes reviewed and incorporated, Labs reviewed , EKG interpreted , Old EKG reviewed, Old chart reviewed, Radiograph reviewed , Discussed with admitting physician , Notes from prior ED visits and [place] Controlled Substance Database    Pertinent labs & imaging results that were available during my care of the patient were reviewed by me and considered in my medical decision making (see chart for details).    ____________________________________________   FINAL CLINICAL IMPRESSION(S) / ED DIAGNOSES  Final diagnoses:  Sepsis with encephalopathy without septic shock, due to unspecified organism [facility])  Healthcare-associated pneumonia      NEW MEDICATIONS STARTED DURING THIS VISIT:  ED Discharge Orders    None       Note:  This document was prepared using Dragon voice recognition software and may include unintentional dictation errors.    Alfred Levins, [place], MD 02/27/19 1520

## 2019-02-27 NOTE — ED Triage Notes (Signed)
Pt arrives POV with husband who states poss aspiration pneumonia. registereted and taken back outside to room 11. Mask in place. Pt very weak. This RN had to lift pt from wheelchair to bed. Pt confused to place, situation. Initially states she doesn't know who brought her to ED. Pt then states name and DOB and year correctly and states husband brought her here. Pt wears 3 L [place] at baseline per pt.

## 2019-02-27 NOTE — Progress Notes (Signed)
CODE SEPSIS - PHARMACY COMMUNICATION  **Broad Spectrum Antibiotics should be administered within 1 hour of Sepsis diagnosis**  Time Code Sepsis Called/Page Received: 1414  Antibiotics Ordered: vancomycin/Unasyn  Time of 1st antibiotic administration: 1458  Additional action taken by pharmacy: Called RN to ask for ABX orders  If necessary, Name of Provider/Nurse Contacted: NA   Pricilla Riffle, PharmD Pharmacy Resident  02/27/2019 3:03 PM

## 2019-02-27 NOTE — ED Notes (Signed)
Homero Fellers, husband: Cell: [phone] home: [phone]

## 2019-02-28 LAB — BASIC METABOLIC PANEL
Anion gap: 9 (ref 5–15)
BUN: 12 mg/dL (ref 8–23)
CO2: 26 mmol/L (ref 22–32)
Calcium: 7.6 mg/dL — ABNORMAL LOW (ref 8.9–10.3)
Chloride: 108 mmol/L (ref 98–111)
Creatinine, Ser: 0.78 mg/dL (ref 0.44–1.00)
GFR calc Af Amer: >60 mL/min (ref >60)
GFR calc non Af Amer: >60 mL/min (ref >60)
Glucose, Bld: 62 mg/dL — ABNORMAL LOW (ref 70–99)
Potassium: 3.7 mmol/L (ref 3.5–5.1)
Sodium: 143 mmol/L (ref 135–145)

## 2019-02-28 LAB — CBC
HCT: 29.6 % — ABNORMAL LOW (ref 36.0–46.0)
Hemoglobin: 9 g/dL — ABNORMAL LOW (ref 12.0–15.0)
MCH: 30.8 pg (ref 26.0–34.0)
MCHC: 30.4 g/dL (ref 30.0–36.0)
MCV: 101.4 fL — ABNORMAL HIGH (ref 80.0–100.0)
Platelets: 197 10*3/uL (ref 150–400)
RBC: 2.92 MIL/uL — ABNORMAL LOW (ref 3.87–5.11)
RDW: 14.9 % (ref 11.5–15.5)
WBC: 8.4 10*3/uL (ref 4.0–10.5)
nRBC: 0 % (ref 0.0–0.2)

## 2019-02-28 NOTE — Progress Notes (Signed)
[employer] - [employer] at [facility]   PATIENT NAME: Vlasta Lillywhite    MR#:  [id number]  DATE OF BIRTH:  January 30, 1951  SUBJECTIVE:  CHIEF COMPLAINT:   Chief Complaint  Patient presents with  . Fever  . Cough   No new complaint this morning.  No fevers.  Intermittent cough.  Resting comfortably.  REVIEW OF SYSTEMS:  Review of Systems  Constitutional: Negative for chills and fever.  HENT: Negative for hearing loss and tinnitus.   Eyes: Negative for blurred vision and double vision.  Respiratory: Positive for cough. Negative for hemoptysis and shortness of breath.   Cardiovascular: Negative for chest pain and palpitations.  Gastrointestinal: Negative for heartburn and nausea.  Genitourinary: Negative for dysuria.  Musculoskeletal: Negative for myalgias and neck pain.  Skin: Negative for itching and rash.  Neurological: Negative for dizziness and headaches.  Psychiatric/Behavioral: Negative for depression and hallucinations.    DRUG ALLERGIES:   Allergies  Allergen Reactions  . Percocet [Oxycodone-Acetaminophen] Hives and Rash  . Aspirin Nausea And Vomiting and Other (See Comments)  . Codeine Nausea And Vomiting, Nausea Only and Other (See Comments)  . Propoxyphene Nausea Only and Other (See Comments)  . Sulfa Antibiotics Rash and Other (See Comments)   VITALS:  Blood pressure 138/69, pulse (!) 101, temperature 98.9 F (37.2 C), temperature source Oral, resp. rate 16, height 4\' 10"  (1.473 m), weight 58.3 kg, SpO2 96 %. PHYSICAL EXAMINATION:  Physical Exam  Constitutional: She is oriented to person, place, and time. She appears well-developed and well-nourished.  HENT:  Head: Normocephalic and atraumatic.  Right Ear: External ear normal.  Eyes: Pupils are equal, round, and reactive to light. Conjunctivae are normal. Right eye exhibits no discharge.  Neck: Normal range of motion. Neck supple. No thyromegaly present.  Cardiovascular: Normal rate, regular  rhythm and normal heart sounds.  Respiratory: Effort normal. She has rales.  GI: Soft. Bowel sounds are normal. She exhibits no distension. There is no abdominal tenderness.  Musculoskeletal: Normal range of motion.        General: No edema.  Neurological: She is alert and oriented to person, place, and time. No cranial nerve deficit.  Skin: Skin is warm. She is not diaphoretic. No erythema.  Psychiatric: She has a normal mood and affect. Her behavior is normal.   LABORATORY PANEL:  Female CBC Recent Labs  Lab 02/28/19 0539  WBC 8.4  HGB 9.0*  HCT 29.6*  PLT 197   ------------------------------------------------------------------------------------------------------------------ Chemistries  Recent Labs  Lab 02/27/19 1404 02/28/19 0539  NA 141 143  K 3.9 3.7  CL 104 108  CO2 28 26  GLUCOSE 96 62*  BUN 14 12  CREATININE 0.79 0.78  CALCIUM 8.4* 7.6*  AST 15  --   ALT 9  --   ALKPHOS 66  --   BILITOT 0.9  --    RADIOLOGY:  Dg Chest Portable 1 View  Result Date: 02/27/2019 CLINICAL DATA:  Cough, fever and weakness. Recent pneumonia. Current smoker. EXAM: PORTABLE CHEST 1 VIEW COMPARISON:  02/01/2019, 01/18/2019 and 12/15/2018 FINDINGS: Patient is rotated to the right. Lungs are adequately inflated demonstrate persistent mild opacification over the left base with interval improvement. No definite effusion. Minimal linear density right base likely atelectasis. Cardiomediastinal silhouette and remainder of the exam is unchanged. IMPRESSION: Persistent left base opacification with interval improvement suggesting continued improving infection. Minimal linear atelectasis right base. Electronically Signed   By: Elberta Fortis M.D.   On: 02/27/2019 14:49  ASSESSMENT AND PLAN:    68 year old female patient with a known history of pneumonia, aspiration pneumonitis, GERD, hyperlipidemia, hypertension, dependent on chronic oxygen, hyperlipidemia, hypertension, COPD presented to the  emergency room for fever and cough.   -Sepsis secondary to healthcare associated pneumonia  Continue IV vancomycin and Unasyn antibiotics and gentle IV fluid hydration.   Patient remains hemodynamically stable.  Procalcitonin level unremarkable   -Pneumonia Healthcare versus aspiration Currently started patient on vancomycin and Unasyn antibiotics Flu test pending COVID-19 test has been sent Droplet and contact precautions Infectious disease consult Speech therapy consult placed to evaluate for risk of aspiration  -Chronic respiratory failure Continue oxygen via nasal cannula at 3 L baseline.  Oxygen saturation of 98% this morning.  -DVT prophylaxis with subcu Lovenox daily    All the records are reviewed and case discussed with Care Management/Social Worker. Management plans discussed with the patient, family and they are in agreement.  CODE STATUS: Full Code  TOTAL TIME TAKING CARE OF THIS PATIENT: 36 minutes.   More than 50% of the time was spent in counseling/coordination of care: YES  POSSIBLE D/C IN 2 DAYS, DEPENDING ON CLINICAL CONDITION.   Sher Shampine M.D on 02/28/2019 at 10:20 AM  Between 7am to 6pm - Pager - [phone]  After 6pm go to [email or web address] - Social research officer, government  [employer]  [facility]ospital[facility]  Office  [phone]  CC: Primary care physician; Malva Limes, MD  Note: This dictation was prepared with Dragon dictation along with smaller phrase technology. Any transcriptional errors that result from this process are unintentional.

## 2019-02-28 NOTE — Progress Notes (Signed)
Pt was sleepy most of afternoon. appetite poor. Vss. Up to bsc with single assist. No incontinence. Pt remains alert and oriented. Dis not wish to speak with family members . "just too tired."

## 2019-03-01 ENCOUNTER — Encounter: Payer: Self-pay | Admitting: Licensed Clinical Social Worker

## 2019-03-01 LAB — CBC
HCT: 31.7 % — ABNORMAL LOW (ref 36.0–46.0)
Hemoglobin: 9.6 g/dL — ABNORMAL LOW (ref 12.0–15.0)
MCH: 30.9 pg (ref 26.0–34.0)
MCHC: 30.3 g/dL (ref 30.0–36.0)
MCV: 101.9 fL — ABNORMAL HIGH (ref 80.0–100.0)
Platelets: 215 10*3/uL (ref 150–400)
RBC: 3.11 MIL/uL — ABNORMAL LOW (ref 3.87–5.11)
RDW: 13.5 % (ref 11.5–15.5)
WBC: 5.9 10*3/uL (ref 4.0–10.5)
nRBC: 0 % (ref 0.0–0.2)

## 2019-03-01 LAB — BLOOD GAS, ARTERIAL
Acid-base deficit: 5.2 mmol/L — ABNORMAL HIGH (ref 0.0–2.0)
Bicarbonate: 20.6 mmol/L (ref 20.0–28.0)
O2 Saturation: 98.3 %
Patient temperature: 38
pCO2 arterial: 40 mmHg (ref 32.0–48.0)
pH, Arterial: 7.31 — ABNORMAL LOW (ref 7.350–7.450)
pO2, Arterial: 125 mmHg — ABNORMAL HIGH (ref 83.0–108.0)

## 2019-03-01 LAB — BASIC METABOLIC PANEL
Anion gap: 12 (ref 5–15)
BUN: 13 mg/dL (ref 8–23)
CO2: 21 mmol/L — ABNORMAL LOW (ref 22–32)
Calcium: 7.7 mg/dL — ABNORMAL LOW (ref 8.9–10.3)
Chloride: 108 mmol/L (ref 98–111)
Creatinine, Ser: 0.67 mg/dL (ref 0.44–1.00)
GFR calc Af Amer: >60 mL/min (ref >60)
GFR calc non Af Amer: >60 mL/min (ref >60)
Glucose, Bld: 56 mg/dL — ABNORMAL LOW (ref 70–99)
Potassium: 4.2 mmol/L (ref 3.5–5.1)
Sodium: 141 mmol/L (ref 135–145)

## 2019-03-01 LAB — NOVEL CORONAVIRUS, NAA (HOSP ORDER, SEND-OUT TO REF LAB; TAT 18-24 HRS): SARS-CoV-2, NAA: NOT DETECTED

## 2019-03-01 LAB — MAGNESIUM: Magnesium: 1.9 mg/dL (ref 1.7–2.4)

## 2019-03-01 LAB — MRSA PCR SCREENING: MRSA by PCR: NEGATIVE

## 2019-03-01 MED ORDER — ESTRADIOL 1 MG PO TABS
0.5000 mg | ORAL_TABLET | Freq: Every day | ORAL | Status: DC
  Administered 2019-03-02: 0.5 mg via ORAL
  Filled 2019-03-01: qty 0.5

## 2019-03-01 MED ORDER — PREGABALIN 75 MG PO CAPS
150.0000 mg | ORAL_CAPSULE | Freq: Two times a day (BID) | ORAL | Status: DC
  Administered 2019-03-01 – 2019-03-02 (×2): 150 mg via ORAL
  Filled 2019-03-01 (×2): qty 2

## 2019-03-01 MED ORDER — PROCHLORPERAZINE EDISYLATE 10 MG/2ML IJ SOLN
10.0000 mg | Freq: Four times a day (QID) | INTRAMUSCULAR | Status: DC | PRN
  Administered 2019-03-01: 10 mg via INTRAVENOUS
  Filled 2019-03-01 (×2): qty 2

## 2019-03-01 MED ORDER — MOMETASONE FURO-FORMOTEROL FUM 200-5 MCG/ACT IN AERO
2.0000 | INHALATION_SPRAY | Freq: Two times a day (BID) | RESPIRATORY_TRACT | Status: DC
  Administered 2019-03-01: 2 via RESPIRATORY_TRACT
  Filled 2019-03-01: qty 8.8

## 2019-03-01 MED ORDER — VANCOMYCIN HCL IN DEXTROSE 750-5 MG/150ML-% IV SOLN
750.0000 mg | INTRAVENOUS | Status: DC
  Administered 2019-03-01: 750 mg via INTRAVENOUS
  Filled 2019-03-01 (×3): qty 150

## 2019-03-01 NOTE — Progress Notes (Signed)
[employer] - [place] at [facility]   PATIENT NAME: Cheryl [name]    MR#:  [id number]  DATE OF BIRTH:  October 21, 1951  SUBJECTIVE:  CHIEF COMPLAINT:   Chief Complaint  Patient presents with  . Fever  . Cough   Patient reported having nausea last night which is improved this morning.  No vomiting.  No fevers overnight.  REVIEW OF SYSTEMS:  Review of Systems  Constitutional: Negative for chills and fever.  HENT: Negative for hearing loss and tinnitus.   Eyes: Negative for blurred vision and double vision.  Respiratory: Positive for cough. Negative for hemoptysis and shortness of breath.   Cardiovascular: Negative for chest pain and palpitations.  Gastrointestinal: Positive for nausea. Negative for heartburn and vomiting.  Genitourinary: Negative for dysuria.  Musculoskeletal: Negative for myalgias and neck pain.  Skin: Negative for itching and rash.  Neurological: Negative for dizziness and headaches.  Psychiatric/Behavioral: Negative for depression and hallucinations.    DRUG ALLERGIES:   Allergies  Allergen Reactions  . Percocet [Oxycodone-Acetaminophen] Hives and Rash  . Aspirin Nausea And Vomiting and Other (See Comments)  . Codeine Nausea And Vomiting, Nausea Only and Other (See Comments)  . Propoxyphene Nausea Only and Other (See Comments)  . Sulfa Antibiotics Rash and Other (See Comments)   VITALS:  Blood pressure 138/67, pulse 95, temperature 98 F (36.7 C), temperature source Oral, resp. rate 20, height 4\' 10"  (1.473 m), weight 58.3 kg, SpO2 98 %. PHYSICAL EXAMINATION:  Physical Exam  Constitutional: She is oriented to person, place, and time. She appears well-developed and well-nourished.  HENT:  Head: Normocephalic and atraumatic.  Right Ear: External ear normal.  Eyes: Pupils are equal, round, and reactive to light. Conjunctivae are normal. Right eye exhibits no discharge.  Neck: Normal range of motion. Neck supple. No thyromegaly present.   Cardiovascular: Normal rate, regular rhythm and normal heart sounds.  Respiratory: Effort normal. She has rales.  GI: Soft. Bowel sounds are normal. She exhibits no distension. There is no abdominal tenderness.  Musculoskeletal: Normal range of motion.        General: No edema.  Neurological: She is alert and oriented to person, place, and time. No cranial nerve deficit.  Skin: Skin is warm. She is not diaphoretic. No erythema.  Psychiatric: She has a normal mood and affect. Her behavior is normal.   LABORATORY PANEL:  Female CBC Recent Labs  Lab 03/01/19 0503  WBC 5.9  HGB 9.6*  HCT 31.7*  PLT 215   ------------------------------------------------------------------------------------------------------------------ Chemistries  Recent Labs  Lab 02/27/19 1404  03/01/19 0503  NA 141   < > 141  K 3.9   < > 4.2  CL 104   < > 108  CO2 28   < > 21*  GLUCOSE 96   < > 56*  BUN 14   < > 13  CREATININE 0.79   < > 0.67  CALCIUM 8.4*   < > 7.7*  MG  --   --  1.9  AST 15  --   --   ALT 9  --   --   ALKPHOS 66  --   --   BILITOT 0.9  --   --    < > = values in this interval not displayed.   RADIOLOGY:  No results found. ASSESSMENT AND PLAN:    68 year old female patient with a known history of pneumonia, aspiration pneumonitis, GERD, hyperlipidemia, hypertension, dependent on chronic oxygen, hyperlipidemia, hypertension, COPD presented to  the emergency room for fever and cough.   -Sepsis secondary to healthcare associated pneumonia  Continue IV vancomycin and Unasyn antibiotics and gentle IV fluid hydration.   Patient remains hemodynamically stable.  Procalcitonin level unremarkable   -Pneumonia Healthcare versus aspiration Currently started patient on vancomycin and Unasyn antibiotics Flu test pending COVID-19 test has been sent Droplet and contact precautions Speech therapy consult placed to evaluate for risk of aspiration  -Chronic respiratory failure Continue oxygen  via nasal cannula at 3 L baseline.  Oxygen saturation of 98% this morning.  -DVT prophylaxis with subcu Lovenox daily    All the records are reviewed and case discussed with Care Management/Social Worker. Management plans discussed with the patient, family and they are in agreement.  CODE STATUS: Full Code  TOTAL TIME TAKING CARE OF THIS PATIENT: .   More than 50% of the time was spent in counseling/coordination of care: YES  POSSIBLE D/C IN 2 DAYS, DEPENDING ON CLINICAL CONDITION.   Cheryl [name] M.D on 03/01/2019 at 11:14 AM  Between 7am to 6pm - Pager - [phone]  After 6pm go to [email or web address] - Social research officer, government  [employer] [place] [facility]  Office  [phone]  CC: Primary care physician; Malva Limes, MD  Note: This dictation was prepared with Dragon dictation along with smaller phrase technology. Any transcriptional errors that result from this process are unintentional.

## 2019-03-01 NOTE — Consult Note (Signed)
Pharmacy Antibiotic Note  Cheryl [name] is a 68 y.o. female admitted on 02/27/2019 with pneumonia.  Pharmacy has been consulted for Unasyn and Vancomycin dosing. Vancomycin AUC goal is 400-550. Previous Scr used was 0.79 mg/dL and current Scr is 3.89 mg/dL; since admitted her renal function has improved. Will need to base Vancomycin calculation on a Scr of 0.80 mg/dL.   MRSA PCR is still pending, Vancomycin would not be needed.   Plan: Will continue Unasyn 3 g q6H.   Change Vancomycin maintenance dose to 750 mg every 24 hours. Expected AUC is 452.7 and Calc Css min 11.8. Plan to order vancomycin level in 4-5 days.   Height: 4\' 10"  (147.3 cm) Weight: 128 lb 8.5 oz (58.3 kg) IBW/kg (Calculated) : 40.9    Temp (24hrs), Avg:98.6 F (37 C), Min:98.4 F (36.9 C), Max:98.8 F (37.1 C)  Recent Labs  Lab 02/27/19 1404 02/27/19 1807 02/28/19 0539 03/01/19 0503  WBC 11.9*  --  8.4 5.9  CREATININE 0.79  --  0.78 0.67  LATICACIDVEN 1.0 1.3  --   --     Estimated Creatinine Clearance: 49.5 mL/min (by C-G formula based on SCr of 0.67 mg/dL).    Allergies  Allergen Reactions  . Percocet [Oxycodone-Acetaminophen] Hives and Rash  . Aspirin Nausea And Vomiting and Other (See Comments)  . Codeine Nausea And Vomiting, Nausea Only and Other (See Comments)  . Propoxyphene Nausea Only and Other (See Comments)  . Sulfa Antibiotics Rash and Other (See Comments)    Antimicrobials this admission: 4/4 Unasyn >>  4/4 Vancomycin >>   Dose adjustments this admission: None  Microbiology results: 4/4 BCx: pending  4/4 MRSA PCR: pending  Thank you for allowing pharmacy to be a part of this patient's care.  Katha Cabal, PharmD 03/01/2019 8:18 AM

## 2019-03-01 NOTE — Evaluation (Signed)
Clinical/Bedside Swallow Evaluation Patient Details  Name: TOPACIO WHITELY MRN: [id number] Date of Birth: 1951/07/26  Today's Date: 03/01/2019 Time: SLP Start Time (ACUTE ONLY): 1320 SLP Stop Time (ACUTE ONLY): 1410 SLP Time Calculation (min) (ACUTE ONLY): 50 min  Past Medical History:  Past Medical History:  Diagnosis Date  . Acute encephalopathy 06/18/2018  . Acute postoperative pain 04/14/2017  . Allergy   . Anemia   . Aspiration pneumonitis (HCC) 11/24/2015  . Asthma   . Chronic pain   . DVT (deep venous thrombosis) (HCC)   . Dysrhythmia   . Encephalopathy    MULTIPLE TIMES  . GERD (gastroesophageal reflux disease)   . Hyperlipidemia   . Hypertension   . Kyphoscoliosis deformity of spine   . Metabolic encephalopathy 12/23/2016  . Migraines   . Neuropathy 2010  . NSTEMI (non-ST elevated myocardial infarction) (HCC)   . Oxygen deficiency   . Pneumonia   . Pneumonia 11/19/2015  . Pneumonia 10/2016  . Pneumonia    aspiration 2018- 4 times in last year  . Rib fracture 09/19/2017   Displace right 9th rib fracture.   . Wears dentures    full upper and lower   Past Surgical History:  Past Surgical History:  Procedure Laterality Date  . ABDOMINAL HYSTERECTOMY  1975   Bilaterl Oophorectomy; Dur to IUD infection  . abdomnal aortic stent  05/30/2008   Dr. Nanetta Batty  . APPENDECTOMY    . APPENDECTOMY    . BACK SURGERY    . cardiac catherization  10/31/2009  . CARDIAC CATHETERIZATION    . CATARACT EXTRACTION W/PHACO Left 08/20/2018   Procedure: CATARACT EXTRACTION PHACO AND INTRAOCULAR LENS PLACEMENT (IOC);  Surgeon: Nevada Crane, MD;  Location: [facility] ORS;  Service: Ophthalmology;  Laterality: Left;  [place] 00:45.1 AP% 12.3 CDE 5.54 FLUID PACK LOT # [id number]  . CATARACT EXTRACTION W/PHACO Right 09/23/2018   Procedure: CATARACT EXTRACTION PHACO AND INTRAOCULAR LENS PLACEMENT (IOC);  Surgeon: Nevada Crane, MD;  Location: [facility] ORS;  Service: Ophthalmology;  Laterality:  Right;  [place] 00:45.0 CDE 5.19 Fluid Pack lot # [id number]  . CERVICAL FUSION  C5 - 6/C6-7  . CHOLECYSTECTOMY  1972  . COLONOSCOPY WITH PROPOFOL N/A 07/27/2015   Procedure: COLONOSCOPY WITH PROPOFOL;  Surgeon: Wallace Cullens, MD;  Location: [facility] ENDOSCOPY;  Service: Gastroenterology;  Laterality: N/A;  . ESOPHAGOGASTRODUODENOSCOPY (EGD) WITH PROPOFOL N/A 07/27/2015   Procedure: ESOPHAGOGASTRODUODENOSCOPY (EGD) WITH PROPOFOL;  Surgeon: Wallace Cullens, MD;  Location: [facility] ENDOSCOPY;  Service: Gastroenterology;  Laterality: N/A;  . ESOPHAGOGASTRODUODENOSCOPY (EGD) WITH PROPOFOL N/A 05/19/2018   Procedure: ESOPHAGOGASTRODUODENOSCOPY (EGD) WITH PROPOFOL;  Surgeon: Midge Minium, MD;  Location: [facility] ENDOSCOPY;  Service: Endoscopy;  Laterality: N/A;  . EYE SURGERY    . FOOT SURGERY Bilateral    5-6 years per patient  . SPINE SURGERY    . TONSILLECTOMY    . VASCULAR SURGERY     LEG STENTS   HPI:  Pt is a 68 y.o. female with a known history of MULTIPLE medical issues including GERD, admissions for N/V w/ aspiration pneumonia, COPD, CAD, DVT, HLD, Htn, PVD, Anxiety, GERD, and Hiatal Hernia. She has had multiple admissions including for pneumonia, hypoxia. She continues to have ongoing tobacco abuse. She uses 3 L nasal cannula oxygen recently treated for aspiration pneumonia in July, December 2019. She started having cough with productive phlegm and shortness of breath with some hypoxia at home. OF note, pt does have longstanding Esophageal phase issuses of GERD -  suspect this could be related to pt's multiple episodes of Pneumonia(possible regurgitation?). Also of note, per Chest CT (05/2018) and CXRs in chart notes, pt has a Large Hiatal Hernia. During last admission in Feb 2020, pt had worsening respiration so brought to emergency room. As per family she also had vomited few times and initially it was clear but then 1 or 2 episodes is converted to dark or black-colored material. It appeared during the admission, she declined  upper Endoscopy intervention. Pt has also declined any repeat MBSS; and often bedside evaluations stating "I don't have any trouble swallowing". This admission, pt's husband stated "patient has been choking at home some" - pt denied this. 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 Again, w/ chronic GERD and any Esophageal dysmotility issues including N/V, this can impact the pharyngeal phase of swallowing and even result in aspiration of Reflux; N/V material which can lead to pneumonia.    Assessment / Plan / Recommendation Clinical Impression  Pt was reluctant to participate in yet "another test" but agreed to do so w/ SLP's min encouragement - in order to demonstrate her toleration of drinking thin liquids. Discussed again the impact of Esophageal issues/dymotility including GERD and N/V on swallowing as well as Pulmonary impact and issues such as pneumonia. Pt has a h/o oropharyngeal phase dysphagia per MBSS in 10/2015 which revealed "swallow delay resulted in deep laryngeal penetration to the level of the vocal cords with thin liquids. This penetration was silent and was not observed to clear by ST. No aspiration was noted then.". Pt also has a longstanding h/o Esophageal phase dysmotility including GERD w/ "acid reflux that burns me sometimes" and a Large Hiatal Hernia per CT scan 05/2019. Husband and pt have reported pt having episodes of increased phlegm causing her to have N/V. She stated she feels "full" after only few bites of food. She has eaten little po during previous hospitalizations; she has declined most meals this admission per NSG report. HOWEVER, pt reported she has recently gained weight, and NSG reported pt has been swallowing Pills Whole w/ Water. Pt consumed the few po trials of thin liquids via Cup w/ no immediate, overt s/s of aspiration noted - no throat clearing/coughing and no wet vocal quality was noted w/ trials when  pt responded verbally to SLP. Of note, silent laryngeal penetration would not be noted at a bedside evaluation. No oral phase deficits noted w/ the trials accepted; pt declined all further po's. Pt has declined a MBSS to reassess her swallowing; last upper Endoscopy revealed Gastritis. Discussed recommending continuing current diet w/ strict aspiration precautions w/ pt; w/ option of repeating MBSS if pt desires. Discussed impact of Esophageal phase dysmotility (GERD) and recommended f/u w/ GI to learn more about the Hiatal Hernia and impact of GERD on the overall swallowing as well as Pulmonary impact if Reflux is aspirated. Evaluation findings and thoughts discussed w/ MD/NSG and explained pt's risk for aspiration from a pharyngeal and Esophageal phase standpoint as well as prior dysphagia and denying need for further assessment. Recommended to NSG to give Pills Whole in Puree (if pt was agreeable) for safer swallowing and Esophageal clearing. ST services will sign off at this time. Pt agreed.  SLP Visit Diagnosis: Dysphagia, pharyngoesophageal phase (R13.14)    Aspiration Risk  Mild aspiration risk from a pharyngeal-Esophageal standpoint   Diet Recommendation  continue current Regular diet w/ meats cut for easier  mastication and Esophageal clearing; aspiration and Reflux precautions.   Medication Administration: Whole meds with puree(for safer swallowing)    Other  Recommendations Recommended Consults: Consider GI evaluation;Consider esophageal assessment(Dietician f/u) Oral Care Recommendations: Oral care BID;Patient independent with oral care Other Recommendations: (n/a at this time)   Follow up Recommendations None      Frequency and Duration (n/a)  (n/a)       Prognosis Prognosis for Safe Diet Advancement: Fair Barriers to Reach Goals: Time post onset;Severity of deficits;Motivation      Swallow Study   General Date of Onset: 02/27/19 HPI: Pt is a 69 y.o. female with a known  history of MULTIPLE medical issues including GERD, admissions for N/V w/ aspiration pneumonia, COPD, CAD, DVT, HLD, Htn, PVD, Anxiety, GERD, and Hiatal Hernia. She has had multiple admissions including for pneumonia, hypoxia. She continues to have ongoing tobacco abuse. She uses 3 L nasal cannula oxygen recently treated for aspiration pneumonia in July, December 2019. She started having cough with productive phlegm and shortness of breath with some hypoxia at home. OF note, pt does have longstanding Esophageal phase issuses of GERD - suspect this could be related to pt's multiple episodes of Pneumonia(possible regurgitation?). Also of note, per Chest CT (05/2018) and CXRs in chart notes, pt has a Large Hiatal Hernia. During last admission in Feb 2020, pt had worsening respiration so brought to emergency room. As per family she also had vomited few times and initially it was clear but then 1 or 2 episodes is converted to dark or black-colored material. It appeared during the admission, she declined upper Endoscopy intervention. Pt has also declined any repeat MBSS; and often bedside evaluations stating "I don't have any trouble swallowing". This admission, pt's husband stated "patient has been choking at home some" - pt denied this. 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 Again, w/ chronic GERD and any Esophageal dysmotility issues including N/V, this can impact the pharyngeal phase of swallowing and even result in aspiration of Reflux; N/V material which can lead to pneumonia.  Type of Study: Bedside Swallow Evaluation Previous Swallow Assessment: 3-4x in past year Diet Prior to this Study: Dysphagia 3 (soft);Thin liquids Temperature Spikes Noted: No(wnc 5.9) Respiratory Status: Nasal cannula(3 liters chronic) History of Recent Intubation: No Behavior/Cognition: Alert;Cooperative;Pleasant mood Oral Cavity Assessment: Within Functional  Limits Oral Care Completed by SLP: Recent completion by staff Oral Cavity - Dentition: Dentures, top(no bottom) Vision: Functional for self-feeding Self-Feeding Abilities: Able to feed self Patient Positioning: Upright in bed Baseline Vocal Quality: Normal Volitional Cough: Strong Volitional Swallow: Able to elicit    Oral/Motor/Sensory Function Overall Oral Motor/Sensory Function: Within functional limits   Ice Chips Ice chips: Not tested   Thin Liquid Thin Liquid: Within functional limits Presentation: Cup;Self Fed(3 trials) Other Comments: pt declined further    Nectar Thick Nectar Thick Liquid: Not tested   Honey Thick Honey Thick Liquid: Not tested   Puree Puree: Not tested Other Comments: pt declined   Solid     Solid: Not tested Other Comments: pt declined        Jerilynn Som, MS, CCC-SLP Watson,Katherine 03/01/2019,2:24 PM

## 2019-03-01 NOTE — Progress Notes (Signed)
Pt c/o nausea 2x this shift and given ordered PRN zofran. Pt did not take any of her nighttime meds other than her lovenox and IV unasyn. Nursing staff will continue to monitor for any changes in patient status. Lamonte Richer, RN

## 2019-03-01 NOTE — TOC Initial Note (Addendum)
Transition of Care [facility]) - Initial/Assessment Note    Patient Details  Name: Cheryl [name] MRN: [id number] Date of Birth: 1951-10-24  Transition of Care [facility]) CM/SW Contact:    Darleene Cleaver, LCSW Phone Number: 03/01/2019, 1:07 PM  Clinical Narrative:                  CSW attempted to speak to patient she was sleeping and did not answer her phone.  CSW spoke to patient's husband, he stated they would like a different home health agency, they were using [facility].  CSW provided choice and ratings patient's husband [facility], however [facility] will not take patient back, so husband chose [facility].  CSW contacted [facility], and informed them that patient does not want to continue with them.  CSW contacted [facility], and they can accept patient with home health PT, nursing, and aide.  Patient's Covid 19 results came back negative.  Expected Discharge Plan: Home w Home Health Services Barriers to Discharge: Continued Medical Work up   Patient Goals and CMS Choice Patient states their goals for this hospitalization and ongoing recovery are:: To return back home with home health PT, nursing, and aide. CMS Medicare.gov Compare Post Acute Care list provided to:: Patient Represenative (must comment)(Patient's husband due to patient not answering phone.) Choice offered to / list presented to : Spouse  Expected Discharge Plan and Services Expected Discharge Plan: Home w Home Health Services In-house Referral: Clinical Social Work   Post Acute Care Choice: Home Health Living arrangements for the past 2 months: Single Family Home                 DME Arranged: Bedside commode, Environmental consultant, Oxygen DME Agency: [facility] HH Arranged: PT, RN, Nurse's Aide HH Agency: [facility]  Prior Living Arrangements/Services Living arrangements for the past 2 months: Single Family Home Lives with:: Spouse Patient language and need for interpreter reviewed:: No Do you feel safe going back to the  place where you live?: Yes      Need for Family Participation in Patient Care: No (Comment) Care giver support system in place?: Yes (comment) Current home services: Homehealth aide, Home PT, Home RN, DME Criminal Activity/Legal Involvement Pertinent to Current Situation/Hospitalization: No - Comment as needed  Activities of Daily Living Home Assistive Devices/Equipment: Shower chair with back, Environmental consultant (specify type), Wheelchair ADL Screening (condition at time of admission) Patient's cognitive ability adequate to safely complete daily activities?: Yes Is the patient deaf or have difficulty hearing?: No Does the patient have difficulty seeing, even when wearing glasses/contacts?: No Does the patient have difficulty concentrating, remembering, or making decisions?: No Patient able to express need for assistance with ADLs?: Yes Does the patient have difficulty dressing or bathing?: Yes Independently performs ADLs?: No Communication: Independent Dressing (OT): Needs assistance Is this a change from baseline?: Pre-admission baseline Grooming: Needs assistance Is this a change from baseline?: Pre-admission baseline Feeding: Independent Bathing: Needs assistance Is this a change from baseline?: Pre-admission baseline Toileting: Needs assistance Is this a change from baseline?: Pre-admission baseline In/Out Bed: Needs assistance Is this a change from baseline?: Pre-admission baseline Walks in Home: Needs assistance Is this a change from baseline?: Pre-admission baseline Does the patient have difficulty walking or climbing stairs?: Yes Weakness of Legs: Both Weakness of Arms/Hands: None  Permission Sought/Granted Permission sought to share information with : Family Supports Permission granted to share information with : Yes, Verbal Permission Granted  Share Information with NAME: Akasia, Bille Spouse [phone]  [phone]   Permission granted to share info w AGENCY: Home health  agencies        Emotional Assessment Appearance:: Appears stated age   Affect (typically observed): Accepting, Stable Orientation: : Oriented to Self, Oriented to Place, Oriented to  Time, Oriented to Situation Alcohol / Substance Use: Tobacco Use Psych Involvement: No (comment)  Admission diagnosis:  Healthcare-associated pneumonia [J18.9] Sepsis with encephalopathy without septic shock, due to unspecified organism (HCC) [A41.9, R65.20, G93.40] Sepsis (HCC) [A41.9] Patient Active Problem List   Diagnosis Date Noted  . Sepsis (HCC) 02/27/2019  . Diastolic dysfunction 02/12/2019  . Tobacco abuse 02/12/2019  . Aspiration pneumonia (HCC) 01/01/2019  . Hiatal hernia 12/24/2018  . Chronic lower extremity pain (Primary Area of Pain) (Bilateral) (L>R) 09/03/2018  . Chronic hip pain (Fourth Area of Pain) (Bilateral) (L>R) 09/03/2018  . Osteoarthritis of hip (Bilateral) 09/03/2018  . Chronic sacroiliac joint pain [facility](Tertiary Area of Pain) (Bilateral) (L>R) 09/03/2018  . Sacroiliac joint dysfunction (Bilateral) 09/03/2018  . Spondylosis without myelopathy or radiculopathy, lumbosacral region 09/03/2018  . Other specified dorsopathies, sacral and sacrococcygeal region 09/03/2018  . DDD (degenerative disc disease), lumbar 09/02/2018  . Abnormal CT scan, lumbar spine (08/04/2017) 09/02/2018  . Levoscoliosis (Severe) 09/02/2018  . 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 Lumbar Grade 1 Anterolisthesis of L3 over L4 and L4 over L5 09/02/2018  . Lumbar foraminal stenosis (Bilateral: L3-4, L4-5) (Severe Right L4-5) 09/02/2018  . Lumbar facet arthropathy (Bilateral) 09/02/2018  . Lumbar facet osteoarthritis (Bilateral) 09/02/2018  . Lumbar spondylosis 09/02/2018  . Cervical spondylosis 09/02/2018  . Thoracic spondylosis 09/02/2018  . DDD (degenerative disc disease), thoracic 09/02/2018  . DDD (degenerative disc disease), cervical 09/02/2018  .  Spondylosis without myelopathy or radiculopathy, lumbar region 09/01/2018  . Anorexia 07/07/2018  . 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 Goals of care, counseling/discussion   . Palliative care by specialist   . Iron deficiency anemia 06/09/2018  . Gastritis without bleeding   . Hypoxia 05/14/2018  . Generalized weakness 02/08/2018  . Marijuana use 06/05/2017  . Hypokalemia 12/23/2016  . Protein-calorie malnutrition, severe 11/06/2016  . Nausea with vomiting   . Lumbar facet syndrome (Bilateral) (L>R) 03/20/2016  . Opiate use (160 MME/Day) 02/28/2016  . Anemia 02/28/2016  . Osteoarthrosis 12/18/2015  . Long term current use of anticoagulant therapy (Plavix) 12/18/2015  . Long term current use of opiate analgesic 12/04/2015  . Fibromyalgia 12/04/2015  . Dysphagia 11/24/2015  . Abnormal mammogram of right breast 10/11/2015  . Dilated intrahepatic bile duct 10/11/2015  . Chronic lumbar radicular pain (Bilateral) (L>R) (L5) 09/04/2015  . Chronic low back pain (Secondary Area of Pain) (Bilateral) (L>R) 09/04/2015  . Encounter for therapeutic drug level monitoring 09/04/2015  . Chronic pain syndrome 09/04/2015  . Platelet inhibition due to Plavix 09/04/2015  . COPD with acute exacerbation (HCC) 07/31/2015  . Airway hyperreactivity 07/03/2015  . Chronic thoracic back pain 07/03/2015  . Excessive falling 07/03/2015  . Alteration in bowel elimination: incontinence 07/03/2015  . Insomnia 07/03/2015  . Decreased testosterone level 07/03/2015  . Leg weakness 07/03/2015  . Menopausal symptom 07/03/2015  . Migraine 07/03/2015  . Neuropathy (HCC) 07/03/2015  . Fecal occult blood test positive 07/03/2015  . OP (osteoporosis) 07/03/2015  . Panic disorder 07/03/2015  . Shortness of breath 07/03/2015  . Compulsive tobacco user syndrome 07/03/2015  . Urinary incontinence 07/03/2015  . Essential hypertension 06/06/2015  . GERD (gastroesophageal reflux disease) 06/06/2015  .  Hyperlipemia 06/06/2015  . Peripheral nerve disease 04/13/2014  . Anxiety 02/10/2014  . Coronary artery disease 02/10/2014  . Peripheral vascular disease (HCC) 02/10/2014  . Vitamin D deficiency 10/16/2009   PCP:  Malva Limes, MD Pharmacy:   [facility] - [place], [place] - [address] [place] [address] Phone: [phone] Fax: [phone]     Social Determinants of Health (SDOH) Interventions    Readmission Risk Interventions No flowsheet data found.

## 2019-03-02 LAB — MAGNESIUM: Magnesium: 1.7 mg/dL (ref 1.7–2.4)

## 2019-03-02 LAB — BASIC METABOLIC PANEL
Anion gap: 13 (ref 5–15)
BUN: 8 mg/dL (ref 8–23)
CO2: 20 mmol/L — ABNORMAL LOW (ref 22–32)
Calcium: 8.1 mg/dL — ABNORMAL LOW (ref 8.9–10.3)
Chloride: 107 mmol/L (ref 98–111)
Creatinine, Ser: 0.69 mg/dL (ref 0.44–1.00)
GFR calc Af Amer: >60 mL/min (ref >60)
GFR calc non Af Amer: >60 mL/min (ref >60)
Glucose, Bld: 66 mg/dL — ABNORMAL LOW (ref 70–99)
Potassium: 4 mmol/L (ref 3.5–5.1)
Sodium: 140 mmol/L (ref 135–145)

## 2019-03-02 MED ORDER — AMOXICILLIN-POT CLAVULANATE 875-125 MG PO TABS: 1.0000 | ORAL_TABLET | Freq: Two times a day (BID) | ORAL | 0 refills | Status: AC

## 2019-03-02 NOTE — TOC Transition Note (Signed)
Transition of Care [facility]) - CM/SW Discharge Note   Patient Details  Name: Cheryl [name] MRN: [id number] Date of Birth: 05/06/1951  [facility] CM/SW Contact:  Darleene Cleaver, LCSW Phone Number: 03/02/2019, 1:11 PM   Clinical Narrative:     Patient will be discharging home with home health PT, RN, and aide with [facility].  Patient expressed that she is looking forewar   Final next level of care: Home w Home Health Services Barriers to Discharge: Barriers Resolved   Patient Goals and CMS Choice Patient states their goals for this hospitalization and ongoing recovery are:: To return back home with home health PT, nursing, and aide. CMS Medicare.gov Compare Post Acute Care list provided to:: Patient Choice offered to / list presented to : Spouse  Discharge Placement   Patient will be discharging back home with home health PT, nursing, and aide.  Patient would like to use [facility] for the home health agency referral made yesterday to [facility].  Patient's husband states they do not have any equipment needs.       Discharge Plan and Services In-house Referral: Clinical Social Work   Post Acute Care Choice: Home Health          DME Arranged: Bedside commode, Environmental consultant, Oxygen DME Agency: [facility] HH Arranged: PT, RN, Nurse's Aide HH Agency: [facility]   Social Determinants of Health (SDOH) Interventions     Readmission Risk Interventions Readmission Risk Prevention Plan 03/01/2019  Transportation Screening Complete  Medication Review Oceanographer) Complete  PCP or Specialist appointment within 3-5 days of discharge Complete  HRI or Home Care Consult Complete  SW Recovery Care/Counseling Consult Complete  Palliative Care Screening Not Applicable  Skilled Nursing Facility Not Applicable  Some recent data might be hidden

## 2019-03-02 NOTE — Care Management Important Message (Signed)
Important Message  Patient Details  Name: KAMBRIA KLIMASZEWSKI MRN: [id number] Date of Birth: 1950-12-04   Medicare Important Message Given:  Yes  Reviewed with patient, aware of right. Initial Medicare IM not signed due to Covid 19 precautions.  Patient gave verbal consent to sign electronically.  Copy left with patient for reference.    Johnell Comings 03/02/2019, 11:05 AM

## 2019-03-02 NOTE — Progress Notes (Signed)
Patient took all of her meds whole two at a time. Patient stated she "does not like the applesauce"

## 2019-03-02 NOTE — Discharge Summary (Signed)
[facility] NAME: Cheryl [name]    MR#:  [id number]  DATE OF BIRTH:  1951-04-17  DATE OF ADMISSION:  02/27/2019   ADMITTING PHYSICIAN: Saundra Shelling, MD  DATE OF DISCHARGE: 03/02/2019  PRIMARY CARE PHYSICIAN: Birdie Sons, MD   ADMISSION DIAGNOSIS:  Healthcare-associated pneumonia [J18.9] Sepsis with encephalopathy without septic shock, due to unspecified organism ([place]) [A41.9, R65.20, G93.40] Sepsis ([place]) [A41.9] DISCHARGE DIAGNOSIS:  Active Problems:   Sepsis ([place])  SECONDARY DIAGNOSIS:   Past Medical History:  Diagnosis Date  . Acute encephalopathy 06/18/2018  . Acute postoperative pain 04/14/2017  . Allergy   . Anemia   . Aspiration pneumonitis ([place]) 11/24/2015  . Asthma   . Chronic pain   . DVT (deep venous thrombosis) ([place])   . Dysrhythmia   . Encephalopathy    MULTIPLE TIMES  . GERD (gastroesophageal reflux disease)   . Hyperlipidemia   . Hypertension   . Kyphoscoliosis deformity of spine   . Metabolic encephalopathy 7/82/9562  . Migraines   . Neuropathy 2010  . NSTEMI (non-ST elevated myocardial infarction) ([place])   . Oxygen deficiency   . Pneumonia   . Pneumonia 11/19/2015  . Pneumonia 10/2016  . Pneumonia    aspiration 2018- 4 times in last year  . Rib fracture 09/19/2017   Displace right 9th rib fracture.   . Wears dentures    full upper and lower   HOSPITAL COURSE:   Chief complaint; fever and cough   History of presenting complaint;  Cheryl [name]  is a 68 y.o. female with a known history of pneumonia, aspiration pneumonitis, GERD, hyperlipidemia, hypertension, dependent on chronic oxygen, hyperlipidemia, hypertension, COPD presented to the emergency room for fever and cough.  Cough is productive of phlegm.  Patient was recently treated for pneumonia and discharged from our hospital.  Still has cough and shortness of breath.  Has generalized weakness.  Currently on oxygen via nasal cannula at 3  L which is at baseline.  She was evaluated in the emergency room flu test was sent and COVID-19 virus test was also sent patient put in droplet and contact precautions.  No history of any recent travel, sick contacts at home.  No history of any international visitors according to the patient.  Please refer to the H&P dictated for further details.  Hospital course -Sepsis secondary to healthcare associated pneumonia  Patient was adequately treated with IV vancomycin and Unasyn.  Adequately hydrated with IV fluids.  Procalcitonin level unremarkable.  Patient remained hemodynamically stable.  Clinically and hemodynamically stable for discharge home.  Being discharged on p.o. antibiotics with Augmentin to complete treatment duration.  -Pneumonia Healthcare versus aspiration Adequately treated with IV antibiotics with vancomycin and Unasyn antibiotics.  Clinically and hemodynamically stable.  Being discharged to p.o. antibiotics to complete treatment duration. Covid test was negative Patient seen by speech therapist.  Appreciate input.  Patient refuses to eat the hospital food because she states she does not like the taste.  Would prefer to be discharged home.  Prefers her home food cooked by her husband.  Clinically and hemodynamically stable and wishes to be discharged home today.  Generalized debility Due to multiple medical problems.  Case manager to set up home health with physical therapy, skilled nursing and aide on discharge.  DISCHARGE CONDITIONS:  Stable CONSULTS OBTAINED:  Treatment Team:  Fritzi Mandes, MD DRUG ALLERGIES:   Allergies  Allergen Reactions  . Percocet [Oxycodone-Acetaminophen] Hives and  Rash  . Aspirin Nausea And Vomiting and Other (See Comments)  . Codeine Nausea And Vomiting, Nausea Only and Other (See Comments)  . Propoxyphene Nausea Only and Other (See Comments)  . Sulfa Antibiotics Rash and Other (See Comments)   DISCHARGE MEDICATIONS:   Allergies as of  03/02/2019      Reactions   Percocet [oxycodone-acetaminophen] Hives, Rash   Aspirin Nausea And Vomiting, Other (See Comments)   Codeine Nausea And Vomiting, Nausea Only, Other (See Comments)   Propoxyphene Nausea Only, Other (See Comments)   Sulfa Antibiotics Rash, Other (See Comments)      Medication List    STOP taking these medications   cephALEXin 500 MG capsule Commonly known as:  KEFLEX     TAKE these medications   acetaminophen 325 MG tablet Commonly known as:  TYLENOL Take 2 tablets (650 mg total) by mouth every 6 (six) hours as needed for mild pain (or Fever >/= 101).   albuterol (2.5 MG/3ML) 0.083% nebulizer solution Commonly known as:  PROVENTIL Take 3 mLs (2.5 mg total) by nebulization every 4 (four) hours as needed for wheezing.   alendronate 70 MG tablet Commonly known as:  FOSAMAX Take 1 tablet (70 mg total) by mouth once a week. Take with a full glass of water on an empty stomach.   ALPRAZolam 0.5 MG tablet Commonly known as:  XANAX Take 0.5-1 tablets (0.25-0.5 mg total) by mouth at bedtime as needed for anxiety.   amoxicillin-clavulanate 875-125 MG tablet Commonly known as:  Augmentin Take 1 tablet by mouth every 12 (twelve) hours for 5 days.   atorvastatin 40 MG tablet Commonly known as:  LIPITOR Take 1 tablet (40 mg total) by mouth daily at 6 PM.   baclofen 10 MG tablet Commonly known as:  LIORESAL Take 1 tablet (10 mg total) by mouth 3 (three) times daily as needed for muscle spasms.   Belsomra 5 MG Tabs Generic drug:  Suvorexant Take 1 tablet by mouth at bedtime as needed (for insomnia). What changed:  See the new instructions.   benzonatate 100 MG capsule Commonly known as:  Tessalon Perles Take 1 capsule (100 mg total) by mouth 3 (three) times daily as needed for cough.   buPROPion 150 MG 12 hr tablet Commonly known as:  Wellbutrin SR 1 tablet daily for 3 days, then 1 tablet twice daily. Stop smoking 14 days after starting medication What  changed:    how much to take  how to take this  when to take this  additional instructions   celecoxib 100 MG capsule Commonly known as:  CELEBREX Take 100 mg by mouth 2 (two) times daily as needed for mild pain or moderate pain.   clopidogrel 75 MG tablet Commonly known as:  PLAVIX Take 75 mg by mouth daily.   diphenoxylate-atropine 2.5-0.025 MG tablet Commonly known as:  LOMOTIL Take 1 tablet by mouth as needed for diarrhea or loose stools.   DULoxetine 30 MG capsule Commonly known as:  CYMBALTA Take 30 mg by mouth daily.   estradiol 0.5 MG tablet Commonly known as:  ESTRACE Take 1 tablet (0.5 mg total) by mouth every other day.   fentaNYL 50 MCG/HR Commonly known as:  [place] 1 patch onto the skin every 3 (three) days for 30 days.   fentaNYL 50 MCG/HR Commonly known as:  [place] 1 patch onto the skin every 3 (three) days for 30 days. Start taking on:  Apr 01, 2019   fentaNYL 50  MCG/HR Commonly known as:  [place] 1 patch onto the skin every 3 (three) days for 30 days. Start taking on:  May 01, 2019   ferrous sulfate 325 (65 FE) MG tablet Take 1 tablet (325 mg total) by mouth 2 (two) times daily with a meal.   Fluticasone-Salmeterol 250-50 MCG/DOSE Aepb Commonly known as:  Advair Diskus TAKE ONE PUFF TWICE DAILY. What changed:    how much to take  how to take this  when to take this  additional instructions   memantine 5 MG tablet Commonly known as:  NAMENDA Take 5 mg by mouth 2 (two) times daily.   mirtazapine 15 MG disintegrating tablet Commonly known as:  REMERON SOL-TAB Take 1 tablet (15 mg total) by mouth at bedtime.   Narcan 4 MG/0.1ML Liqd nasal spray kit Generic drug:  naloxone Place 0.4 mg into the nose daily as needed (for accidental overdose.).   ondansetron 4 MG tablet Commonly known as:  ZOFRAN Take 1 tablet (4 mg total) by mouth as needed. What changed:  reasons to take this   pantoprazole 40 MG tablet  Commonly known as:  PROTONIX Take 40-80 mg by mouth See admin instructions. Take 1 tablet (40 mg) by mouth every morning and take 1 additional tablet ('40mg'$ ) every evening as needed for heartburn   potassium chloride 10 MEQ tablet Commonly known as:  K-DUR,KLOR-CON Take 10 mEq by mouth daily.   pregabalin 300 MG capsule Commonly known as:  LYRICA Take 300 mg by mouth 2 (two) times daily.   silver sulfADIAZINE 1 % cream Commonly known as:  SILVADENE Apply 1 application topically daily as needed (leg infection).   triamcinolone 0.025 % ointment Commonly known as:  KENALOG Apply 1 application topically 2 (two) times daily. What changed:    when to take this  reasons to take this   vitamin B-12 1000 MCG tablet Commonly known as:  CYANOCOBALAMIN Take 1 tablet (1,000 mcg total) by mouth daily.        DISCHARGE INSTRUCTIONS:   DIET:  Cardiac diet DISCHARGE CONDITION:  Stable ACTIVITY:  Activity as tolerated OXYGEN:  Home Oxygen: Yes.    Oxygen Delivery: Oxygen via nasal cannula DISCHARGE LOCATION:  home   If you experience worsening of your admission symptoms, develop shortness of breath, life threatening emergency, suicidal or homicidal thoughts you must seek medical attention immediately by calling 911 or calling your MD immediately  if symptoms less severe.  You Must read complete instructions/literature along with all the possible adverse reactions/side effects for all the Medicines you take and that have been prescribed to you. Take any new Medicines after you have completely understood and accpet all the possible adverse reactions/side effects.   Please note  You were cared for by a hospitalist during your hospital stay. If you have any questions about your discharge medications or the care you received while you were in the hospital after you are discharged, you can call the unit and asked to speak with the hospitalist on call if the hospitalist that took care of  you is not available. Once you are discharged, your primary care physician will handle any further medical issues. Please note that NO REFILLS for any discharge medications will be authorized once you are discharged, as it is imperative that you return to your primary care physician (or establish a relationship with a primary care physician if you do not have one) for your aftercare needs so that they can reassess your need for  medications and monitor your lab values.    On the day of Discharge:  VITAL SIGNS:  Blood pressure (!) 160/83, pulse 100, temperature 97.8 F (36.6 C), temperature source Oral, resp. rate 16, height '4\' 10"'$  (1.473 m), weight 58.3 kg, SpO2 96 %. PHYSICAL EXAMINATION:  GENERAL:  68 y.o.-year-old patient lying in the bed with no acute distress.  EYES: Pupils equal, round, reactive to light and accommodation. No scleral icterus. Extraocular muscles intact.  HEENT: Head atraumatic, normocephalic. Oropharynx and nasopharynx clear.  NECK:  Supple, no jugular venous distention. No thyroid enlargement, no tenderness.  LUNGS: Normal breath sounds bilaterally, no wheezing, rales,rhonchi or crepitation. No use of accessory muscles of respiration.  CARDIOVASCULAR: S1, S2 normal. No murmurs, rubs, or gallops.  ABDOMEN: Soft, non-tender, non-distended. Bowel sounds present. No organomegaly or mass.  EXTREMITIES: No pedal edema, cyanosis, or clubbing.  NEUROLOGIC: Cranial nerves II through XII are intact. Muscle strength 5/5 in all extremities. Sensation intact. Gait not checked.  PSYCHIATRIC: The patient is alert and oriented x 3.  SKIN: No obvious rash, lesion, or ulcer.  DATA REVIEW:   CBC Recent Labs  Lab 03/01/19 0503  WBC 5.9  HGB 9.6*  HCT 31.7*  PLT 215    Chemistries  Recent Labs  Lab 02/27/19 1404  03/02/19 0242  NA 141   < > 140  K 3.9   < > 4.0  CL 104   < > 107  CO2 28   < > 20*  GLUCOSE 96   < > 66*  BUN 14   < > 8  CREATININE 0.79   < > 0.69  CALCIUM  8.4*   < > 8.1*  MG  --    < > 1.7  AST 15  --   --   ALT 9  --   --   ALKPHOS 66  --   --   BILITOT 0.9  --   --    < > = values in this interval not displayed.     Microbiology Results  Results for orders placed or performed during the hospital encounter of 02/27/19  Culture, blood (Routine x 2)     Status: None (Preliminary result)   Collection Time: 02/27/19  2:04 PM  Result Value Ref Range Status   Specimen Description BLOOD RIGHT ANTECUBITAL  Final   Special Requests   Final    BOTTLES DRAWN AEROBIC AND ANAEROBIC Blood Culture adequate volume   Culture   Final    NO GROWTH 3 DAYS Performed at [facility], [address] [place], [place] [address]    Report Status PENDING  Incomplete  Novel Coronavirus, NAA (hospital order; send-out to ref lab)     Status: None   Collection Time: 02/27/19  2:29 PM  Result Value Ref Range Status   SARS-CoV-2, NAA NOT DETECTED NOT DETECTED Final    Comment: Performed at [facility] Performed at [facility], [address] [place], [place] [address]    Coronavirus Source NASOPHARYNGEAL  Final    Comment: Performed at [facility], [place], [place] [address]  MRSA PCR Screening     Status: None   Collection Time: 03/01/19  5:59 PM  Result Value Ref Range Status   MRSA by PCR NEGATIVE NEGATIVE Final    Comment:        The GeneXpert MRSA Assay (FDA approved for NASAL specimens only), is one component of a comprehensive MRSA colonization  surveillance program. It is not intended to diagnose MRSA infection nor to guide or monitor treatment for MRSA infections. Performed at [facility], [address], [place], [place] [address]     RADIOLOGY:  No results found.   Management plans discussed with the patient, family and they are in agreement.  CODE STATUS: Full Code   TOTAL TIME TAKING CARE OF THIS PATIENT: 38 minutes.    Kiron Osmun M.D on 03/02/2019 at 10:37 AM   Between 7am to 6pm - Pager - [phone]  After 6pm go to [email or web address] - Proofreader  [facility]  Office  [phone]  CC: Primary care physician; Birdie Sons, MD   Note: This dictation was prepared with Dragon dictation along with smaller phrase technology. Any transcriptional errors that result from this process are unintentional.

## 2019-03-02 NOTE — Clinical Social Work Note (Signed)
CSW was informed that patient uses [facility] for her oxygen [phone].  Patient will be discharging home with home health and home oxygen.  Ervin Knack. Tymber Stallings, MSW, LCSW [phone]  03/02/2019 3:52 PM

## 2019-03-03 ENCOUNTER — Telehealth: Payer: Self-pay

## 2019-03-03 DIAGNOSIS — J189 Pneumonia, unspecified organism: Secondary | ICD-10-CM | POA: Diagnosis not present

## 2019-03-03 DIAGNOSIS — I10 Essential (primary) hypertension: Secondary | ICD-10-CM | POA: Diagnosis not present

## 2019-03-03 DIAGNOSIS — Z9981 Dependence on supplemental oxygen: Secondary | ICD-10-CM | POA: Diagnosis not present

## 2019-03-03 DIAGNOSIS — R2681 Unsteadiness on feet: Secondary | ICD-10-CM | POA: Diagnosis not present

## 2019-03-03 DIAGNOSIS — Z87891 Personal history of nicotine dependence: Secondary | ICD-10-CM | POA: Diagnosis not present

## 2019-03-03 DIAGNOSIS — J449 Chronic obstructive pulmonary disease, unspecified: Secondary | ICD-10-CM | POA: Diagnosis not present

## 2019-03-03 DIAGNOSIS — M6281 Muscle weakness (generalized): Secondary | ICD-10-CM | POA: Diagnosis not present

## 2019-03-03 DIAGNOSIS — Z86718 Personal history of other venous thrombosis and embolism: Secondary | ICD-10-CM | POA: Diagnosis not present

## 2019-03-03 NOTE — Telephone Encounter (Signed)
Called pt to complete her transition of care call and pt declined completing this at the moment. Pt states she does not feel up to talking. Advised pt we will check back in with her tomorrow. Pt agreed.  I will be out of the office tomorrow. Will forward this to [facility] to call in my absense. Please forward back to Dr Sherrie Mustache once completed.  Thanks, MM

## 2019-03-03 NOTE — Telephone Encounter (Signed)
HFU scheduled for 15-Mar-2019 @ 2:00 PM.

## 2019-03-04 LAB — CULTURE, BLOOD (ROUTINE X 2)
Culture: NO GROWTH
Special Requests: ADEQUATE

## 2019-03-04 NOTE — Telephone Encounter (Signed)
Attempted to reach patient to follow up for TCM call. No answer and voicemail full.

## 2019-03-05 DIAGNOSIS — J189 Pneumonia, unspecified organism: Secondary | ICD-10-CM | POA: Diagnosis not present

## 2019-03-05 DIAGNOSIS — R2681 Unsteadiness on feet: Secondary | ICD-10-CM | POA: Diagnosis not present

## 2019-03-05 DIAGNOSIS — J449 Chronic obstructive pulmonary disease, unspecified: Secondary | ICD-10-CM | POA: Diagnosis not present

## 2019-03-05 DIAGNOSIS — M6281 Muscle weakness (generalized): Secondary | ICD-10-CM | POA: Diagnosis not present

## 2019-03-05 DIAGNOSIS — Z86718 Personal history of other venous thrombosis and embolism: Secondary | ICD-10-CM | POA: Diagnosis not present

## 2019-03-05 DIAGNOSIS — I10 Essential (primary) hypertension: Secondary | ICD-10-CM | POA: Diagnosis not present

## 2019-03-05 DIAGNOSIS — Z9981 Dependence on supplemental oxygen: Secondary | ICD-10-CM | POA: Diagnosis not present

## 2019-03-05 DIAGNOSIS — Z87891 Personal history of nicotine dependence: Secondary | ICD-10-CM | POA: Diagnosis not present

## 2019-03-06 ENCOUNTER — Inpatient Hospital Stay
Admission: EM | Admit: 2019-03-06 | Discharge: 2019-03-26 | DRG: 871 | Disposition: E | Payer: Medicare HMO | Attending: Internal Medicine | Admitting: Internal Medicine

## 2019-03-06 ENCOUNTER — Other Ambulatory Visit: Payer: Self-pay

## 2019-03-06 ENCOUNTER — Emergency Department: Payer: Medicare HMO

## 2019-03-06 ENCOUNTER — Inpatient Hospital Stay: Payer: Medicare HMO

## 2019-03-06 DIAGNOSIS — R092 Respiratory arrest: Secondary | ICD-10-CM | POA: Diagnosis not present

## 2019-03-06 DIAGNOSIS — I110 Hypertensive heart disease with heart failure: Secondary | ICD-10-CM | POA: Diagnosis present

## 2019-03-06 DIAGNOSIS — R6520 Severe sepsis without septic shock: Secondary | ICD-10-CM | POA: Diagnosis not present

## 2019-03-06 DIAGNOSIS — E876 Hypokalemia: Secondary | ICD-10-CM | POA: Diagnosis present

## 2019-03-06 DIAGNOSIS — G9341 Metabolic encephalopathy: Secondary | ICD-10-CM | POA: Diagnosis present

## 2019-03-06 DIAGNOSIS — E43 Unspecified severe protein-calorie malnutrition: Secondary | ICD-10-CM | POA: Diagnosis not present

## 2019-03-06 DIAGNOSIS — F17210 Nicotine dependence, cigarettes, uncomplicated: Secondary | ICD-10-CM | POA: Diagnosis present

## 2019-03-06 DIAGNOSIS — R0602 Shortness of breath: Secondary | ICD-10-CM

## 2019-03-06 DIAGNOSIS — I2510 Atherosclerotic heart disease of native coronary artery without angina pectoris: Secondary | ICD-10-CM | POA: Diagnosis present

## 2019-03-06 DIAGNOSIS — D72819 Decreased white blood cell count, unspecified: Secondary | ICD-10-CM | POA: Diagnosis present

## 2019-03-06 DIAGNOSIS — Z66 Do not resuscitate: Secondary | ICD-10-CM | POA: Diagnosis present

## 2019-03-06 DIAGNOSIS — Y95 Nosocomial condition: Secondary | ICD-10-CM | POA: Diagnosis present

## 2019-03-06 DIAGNOSIS — Z79891 Long term (current) use of opiate analgesic: Secondary | ICD-10-CM

## 2019-03-06 DIAGNOSIS — Z86718 Personal history of other venous thrombosis and embolism: Secondary | ICD-10-CM | POA: Diagnosis not present

## 2019-03-06 DIAGNOSIS — E872 Acidosis: Secondary | ICD-10-CM | POA: Diagnosis present

## 2019-03-06 DIAGNOSIS — B9620 Unspecified Escherichia coli [E. coli] as the cause of diseases classified elsewhere: Secondary | ICD-10-CM | POA: Diagnosis present

## 2019-03-06 DIAGNOSIS — D539 Nutritional anemia, unspecified: Secondary | ICD-10-CM | POA: Diagnosis present

## 2019-03-06 DIAGNOSIS — A419 Sepsis, unspecified organism: Secondary | ICD-10-CM | POA: Diagnosis not present

## 2019-03-06 DIAGNOSIS — Z515 Encounter for palliative care: Secondary | ICD-10-CM | POA: Diagnosis present

## 2019-03-06 DIAGNOSIS — J189 Pneumonia, unspecified organism: Secondary | ICD-10-CM | POA: Diagnosis not present

## 2019-03-06 DIAGNOSIS — Z9911 Dependence on respirator [ventilator] status: Secondary | ICD-10-CM | POA: Diagnosis not present

## 2019-03-06 DIAGNOSIS — B961 Klebsiella pneumoniae [K. pneumoniae] as the cause of diseases classified elsewhere: Secondary | ICD-10-CM | POA: Diagnosis present

## 2019-03-06 DIAGNOSIS — N170 Acute kidney failure with tubular necrosis: Secondary | ICD-10-CM | POA: Diagnosis not present

## 2019-03-06 DIAGNOSIS — N179 Acute kidney failure, unspecified: Secondary | ICD-10-CM

## 2019-03-06 DIAGNOSIS — K219 Gastro-esophageal reflux disease without esophagitis: Secondary | ICD-10-CM | POA: Diagnosis present

## 2019-03-06 DIAGNOSIS — J9621 Acute and chronic respiratory failure with hypoxia: Secondary | ICD-10-CM | POA: Diagnosis present

## 2019-03-06 DIAGNOSIS — Z7902 Long term (current) use of antithrombotics/antiplatelets: Secondary | ICD-10-CM | POA: Diagnosis not present

## 2019-03-06 DIAGNOSIS — J9690 Respiratory failure, unspecified, unspecified whether with hypoxia or hypercapnia: Secondary | ICD-10-CM | POA: Diagnosis not present

## 2019-03-06 DIAGNOSIS — Z79899 Other long term (current) drug therapy: Secondary | ICD-10-CM

## 2019-03-06 DIAGNOSIS — R000 Tachycardia, unspecified: Secondary | ICD-10-CM | POA: Diagnosis not present

## 2019-03-06 DIAGNOSIS — R0902 Hypoxemia: Secondary | ICD-10-CM | POA: Diagnosis not present

## 2019-03-06 DIAGNOSIS — I5022 Chronic systolic (congestive) heart failure: Secondary | ICD-10-CM | POA: Diagnosis present

## 2019-03-06 DIAGNOSIS — R4020 Unspecified coma: Secondary | ICD-10-CM | POA: Diagnosis not present

## 2019-03-06 DIAGNOSIS — I420 Dilated cardiomyopathy: Secondary | ICD-10-CM | POA: Diagnosis not present

## 2019-03-06 DIAGNOSIS — R627 Adult failure to thrive: Secondary | ICD-10-CM | POA: Diagnosis present

## 2019-03-06 DIAGNOSIS — Z7952 Long term (current) use of systemic steroids: Secondary | ICD-10-CM | POA: Diagnosis not present

## 2019-03-06 DIAGNOSIS — R34 Anuria and oliguria: Secondary | ICD-10-CM | POA: Diagnosis present

## 2019-03-06 DIAGNOSIS — R918 Other nonspecific abnormal finding of lung field: Secondary | ICD-10-CM | POA: Diagnosis not present

## 2019-03-06 DIAGNOSIS — R404 Transient alteration of awareness: Secondary | ICD-10-CM | POA: Diagnosis not present

## 2019-03-06 DIAGNOSIS — J9601 Acute respiratory failure with hypoxia: Secondary | ICD-10-CM

## 2019-03-06 DIAGNOSIS — Z7189 Other specified counseling: Secondary | ICD-10-CM | POA: Diagnosis not present

## 2019-03-06 DIAGNOSIS — J440 Chronic obstructive pulmonary disease with acute lower respiratory infection: Secondary | ICD-10-CM | POA: Diagnosis not present

## 2019-03-06 DIAGNOSIS — J449 Chronic obstructive pulmonary disease, unspecified: Secondary | ICD-10-CM | POA: Diagnosis not present

## 2019-03-06 DIAGNOSIS — J690 Pneumonitis due to inhalation of food and vomit: Secondary | ICD-10-CM | POA: Diagnosis not present

## 2019-03-06 DIAGNOSIS — R6521 Severe sepsis with septic shock: Secondary | ICD-10-CM | POA: Diagnosis not present

## 2019-03-06 DIAGNOSIS — Z90710 Acquired absence of both cervix and uterus: Secondary | ICD-10-CM | POA: Diagnosis not present

## 2019-03-06 DIAGNOSIS — Z7951 Long term (current) use of inhaled steroids: Secondary | ICD-10-CM

## 2019-03-06 DIAGNOSIS — R464 Slowness and poor responsiveness: Secondary | ICD-10-CM | POA: Diagnosis not present

## 2019-03-06 LAB — TROPONIN I: Troponin I: 0.07 ng/mL (ref <0.03)

## 2019-03-06 LAB — BLOOD GAS, ARTERIAL
Acid-base deficit: 0.7 mmol/L (ref 0.0–2.0)
Bicarbonate: 26.6 mmol/L (ref 20.0–28.0)
FIO2: 1
MECHVT: 450 mL
O2 Saturation: 89.6 %
PEEP: 5 cmH2O
Patient temperature: 37
RATE: 16 resp/min
pCO2 arterial: 54 mmHg — ABNORMAL HIGH (ref 32.0–48.0)
pH, Arterial: 7.3 — ABNORMAL LOW (ref 7.350–7.450)
pO2, Arterial: 64 mmHg — ABNORMAL LOW (ref 83.0–108.0)

## 2019-03-06 LAB — URINALYSIS, COMPLETE (UACMP) WITH MICROSCOPIC
Bacteria, UA: NONE SEEN
Bilirubin Urine: NEGATIVE
Glucose, UA: NEGATIVE mg/dL
Hgb urine dipstick: NEGATIVE
Ketones, ur: NEGATIVE mg/dL
Leukocytes,Ua: NEGATIVE
Nitrite: NEGATIVE
Protein, ur: NEGATIVE mg/dL
Specific Gravity, Urine: 1.02 (ref 1.005–1.030)
pH: 5 (ref 5.0–8.0)

## 2019-03-06 LAB — CBC WITH DIFFERENTIAL/PLATELET
Abs Immature Granulocytes: 0 10*3/uL (ref 0.00–0.07)
Band Neutrophils: 4 %
Basophils Absolute: 0 10*3/uL (ref 0.0–0.1)
Basophils Relative: 1 %
Eosinophils Absolute: 0 10*3/uL (ref 0.0–0.5)
Eosinophils Relative: 0 %
HCT: 38.3 % (ref 36.0–46.0)
Hemoglobin: 11.6 g/dL — ABNORMAL LOW (ref 12.0–15.0)
Lymphocytes Relative: 65 %
Lymphs Abs: 1.5 10*3/uL (ref 0.7–4.0)
MCH: 30.6 pg (ref 26.0–34.0)
MCHC: 30.3 g/dL (ref 30.0–36.0)
MCV: 101.1 fL — ABNORMAL HIGH (ref 80.0–100.0)
Monocytes Absolute: 0.1 10*3/uL (ref 0.1–1.0)
Monocytes Relative: 5 %
Neutro Abs: 0.7 10*3/uL — ABNORMAL LOW (ref 1.7–7.7)
Neutrophils Relative %: 25 %
Platelets: 318 10*3/uL (ref 150–400)
RBC: 3.79 MIL/uL — ABNORMAL LOW (ref 3.87–5.11)
RDW: 14.6 % (ref 11.5–15.5)
WBC: 2.3 10*3/uL — ABNORMAL LOW (ref 4.0–10.5)
nRBC: 0.9 % — ABNORMAL HIGH (ref 0.0–0.2)

## 2019-03-06 LAB — BRAIN NATRIURETIC PEPTIDE: B Natriuretic Peptide: 77 pg/mL (ref 0.0–100.0)

## 2019-03-06 LAB — GLUCOSE, CAPILLARY
Glucose-Capillary: 47 mg/dL — ABNORMAL LOW (ref 70–99)
Glucose-Capillary: 59 mg/dL — ABNORMAL LOW (ref 70–99)
Glucose-Capillary: 91 mg/dL (ref 70–99)

## 2019-03-06 LAB — COMPREHENSIVE METABOLIC PANEL
ALT: 11 U/L (ref 0–44)
AST: 30 U/L (ref 15–41)
Albumin: 3 g/dL — ABNORMAL LOW (ref 3.5–5.0)
Alkaline Phosphatase: 51 U/L (ref 38–126)
Anion gap: 11 (ref 5–15)
BUN: 29 mg/dL — ABNORMAL HIGH (ref 8–23)
CO2: 25 mmol/L (ref 22–32)
Calcium: 8.2 mg/dL — ABNORMAL LOW (ref 8.9–10.3)
Chloride: 102 mmol/L (ref 98–111)
Creatinine, Ser: 2.98 mg/dL — ABNORMAL HIGH (ref 0.44–1.00)
GFR calc Af Amer: 18 mL/min — ABNORMAL LOW (ref >60)
GFR calc non Af Amer: 15 mL/min — ABNORMAL LOW (ref >60)
Glucose, Bld: 79 mg/dL (ref 70–99)
Potassium: 3.1 mmol/L — ABNORMAL LOW (ref 3.5–5.1)
Sodium: 138 mmol/L (ref 135–145)
Total Bilirubin: 0.7 mg/dL (ref 0.3–1.2)
Total Protein: 6.2 g/dL — ABNORMAL LOW (ref 6.5–8.1)

## 2019-03-06 LAB — LACTIC ACID, PLASMA
Lactic Acid, Venous: 4.7 mmol/L (ref 0.5–1.9)
Lactic Acid, Venous: 5.6 mmol/L (ref 0.5–1.9)
Lactic Acid, Venous: 4 mmol/L (ref 0.5–1.9)

## 2019-03-06 LAB — PROTIME-INR
INR: 1.4 — ABNORMAL HIGH (ref 0.8–1.2)
Prothrombin Time: 16.5 seconds — ABNORMAL HIGH (ref 11.4–15.2)

## 2019-03-06 LAB — PROCALCITONIN: Procalcitonin: 104.68 ng/mL

## 2019-03-06 LAB — TRIGLYCERIDES: Triglycerides: 67 mg/dL (ref <150)

## 2019-03-06 MED ORDER — PROPOFOL 1000 MG/100ML IV EMUL: 0.0000 ug/kg/min | INTRAVENOUS | Status: DC

## 2019-03-06 MED ORDER — PIPERACILLIN-TAZOBACTAM IN DEX 2-0.25 GM/50ML IV SOLN
2.2500 g | Freq: Four times a day (QID) | INTRAVENOUS | Status: DC
  Filled 2019-03-06 (×4): qty 50

## 2019-03-06 MED ORDER — SODIUM CHLORIDE 0.9% FLUSH
3.0000 mL | Freq: Two times a day (BID) | INTRAVENOUS | Status: DC
  Administered 2019-03-07 (×2): 3 mL via INTRAVENOUS

## 2019-03-06 MED ORDER — PANTOPRAZOLE SODIUM 40 MG IV SOLR
40.0000 mg | Freq: Every day | INTRAVENOUS | Status: DC
  Administered 2019-03-06 – 2019-03-07 (×2): 40 mg via INTRAVENOUS
  Filled 2019-03-06 (×2): qty 40

## 2019-03-06 MED ORDER — VANCOMYCIN HCL 500 MG IV SOLR: 500.0000 mg | INTRAVENOUS | Status: DC

## 2019-03-06 MED ORDER — SODIUM CHLORIDE 0.9 % IV BOLUS
1000.0000 mL | Freq: Once | INTRAVENOUS | Status: AC
  Administered 2019-03-06: 1000 mL via INTRAVENOUS

## 2019-03-06 MED ORDER — LACTATED RINGERS IV SOLN
INTRAVENOUS | Status: DC
  Administered 2019-03-06: 17:00:00 via INTRAVENOUS

## 2019-03-06 MED ORDER — PIPERACILLIN-TAZOBACTAM 3.375 G IVPB
3.3750 g | Freq: Two times a day (BID) | INTRAVENOUS | Status: DC
  Administered 2019-03-06 – 2019-03-07 (×2): 3.375 g via INTRAVENOUS
  Filled 2019-03-06 (×2): qty 50

## 2019-03-06 MED ORDER — ONDANSETRON HCL 4 MG/2ML IJ SOLN: 4.0000 mg | Freq: Four times a day (QID) | INTRAMUSCULAR | Status: DC | PRN

## 2019-03-06 MED ORDER — ACETAMINOPHEN 650 MG RE SUPP: 650.0000 mg | Freq: Four times a day (QID) | RECTAL | Status: DC | PRN

## 2019-03-06 MED ORDER — VANCOMYCIN HCL IN DEXTROSE 1-5 GM/200ML-% IV SOLN: 1000.0000 mg | Freq: Once | INTRAVENOUS | Status: DC

## 2019-03-06 MED ORDER — ORAL CARE MOUTH RINSE
15.0000 mL | OROMUCOSAL | Status: DC
  Administered 2019-03-06 – 2019-03-08 (×14): 15 mL via OROMUCOSAL

## 2019-03-06 MED ORDER — NOREPINEPHRINE 4 MG/250ML-% IV SOLN
0.0000 ug/min | INTRAVENOUS | Status: DC
  Administered 2019-03-06: 17:00:00 2 ug/min via INTRAVENOUS
  Filled 2019-03-06: qty 250

## 2019-03-06 MED ORDER — IPRATROPIUM-ALBUTEROL 0.5-2.5 (3) MG/3ML IN SOLN
3.0000 mL | Freq: Four times a day (QID) | RESPIRATORY_TRACT | Status: DC
  Administered 2019-03-06 – 2019-03-08 (×7): 3 mL via RESPIRATORY_TRACT
  Filled 2019-03-06 (×7): qty 3

## 2019-03-06 MED ORDER — HYDROCORTISONE NA SUCCINATE PF 100 MG IJ SOLR
50.0000 mg | Freq: Two times a day (BID) | INTRAMUSCULAR | Status: DC
  Administered 2019-03-06 – 2019-03-07 (×3): 50 mg via INTRAVENOUS
  Filled 2019-03-06 (×3): qty 2

## 2019-03-06 MED ORDER — DEXTROSE 50 % IV SOLN
INTRAVENOUS | Status: AC
  Administered 2019-03-06: 50 mL
  Filled 2019-03-06: qty 50

## 2019-03-06 MED ORDER — HEPARIN SODIUM (PORCINE) 5000 UNIT/ML IJ SOLN
5000.0000 [IU] | Freq: Two times a day (BID) | INTRAMUSCULAR | Status: DC
  Administered 2019-03-07 (×2): 5000 [IU] via SUBCUTANEOUS
  Filled 2019-03-06 (×2): qty 1

## 2019-03-06 MED ORDER — CHLORHEXIDINE GLUCONATE 0.12% ORAL RINSE (MEDLINE KIT)
15.0000 mL | Freq: Two times a day (BID) | OROMUCOSAL | Status: DC
  Administered 2019-03-07 (×2): 15 mL via OROMUCOSAL

## 2019-03-06 MED ORDER — FENTANYL CITRATE (PF) 100 MCG/2ML IJ SOLN: 50.0000 ug | INTRAMUSCULAR | Status: DC | PRN

## 2019-03-06 MED ORDER — ONDANSETRON HCL 4 MG PO TABS: 4.0000 mg | ORAL_TABLET | Freq: Four times a day (QID) | ORAL | Status: DC | PRN

## 2019-03-06 MED ORDER — NOREPINEPHRINE 4 MG/250ML-% IV SOLN
INTRAVENOUS | Status: AC
  Administered 2019-03-06: 17:00:00 2 ug/min via INTRAVENOUS
  Filled 2019-03-06: qty 250

## 2019-03-06 MED ORDER — PROPOFOL 1000 MG/100ML IV EMUL
INTRAVENOUS | Status: AC
  Filled 2019-03-06: qty 100

## 2019-03-06 MED ORDER — HEPARIN SODIUM (PORCINE) 5000 UNIT/ML IJ SOLN: 5000.0000 [IU] | Freq: Three times a day (TID) | INTRAMUSCULAR | Status: DC

## 2019-03-06 MED ORDER — ALBUTEROL SULFATE HFA 108 (90 BASE) MCG/ACT IN AERS
2.0000 | INHALATION_SPRAY | RESPIRATORY_TRACT | Status: DC | PRN
  Filled 2019-03-06: qty 6.7

## 2019-03-06 MED ORDER — PROPOFOL 1000 MG/100ML IV EMUL
0.0000 ug/kg/min | INTRAVENOUS | Status: DC
  Administered 2019-03-06: 5 ug/kg/min via INTRAVENOUS

## 2019-03-06 MED ORDER — DEXTROSE 50 % IV SOLN
50.0000 mL | Freq: Once | INTRAVENOUS | Status: AC
  Administered 2019-03-07: 50 mL via INTRAVENOUS

## 2019-03-06 MED ORDER — NOREPINEPHRINE 16 MG/250ML-% IV SOLN
0.0000 ug/min | INTRAVENOUS | Status: DC
  Administered 2019-03-06: 35 ug/min via INTRAVENOUS
  Administered 2019-03-07: 23:00:00 13.973 ug/min via INTRAVENOUS
  Administered 2019-03-07: 07:00:00 25 ug/min via INTRAVENOUS
  Filled 2019-03-06 (×3): qty 250

## 2019-03-06 MED ORDER — BUDESONIDE 0.25 MG/2ML IN SUSP
0.2500 mg | Freq: Four times a day (QID) | RESPIRATORY_TRACT | Status: DC
  Administered 2019-03-06 – 2019-03-08 (×7): 0.25 mg via RESPIRATORY_TRACT
  Filled 2019-03-06 (×7): qty 2

## 2019-03-06 MED ORDER — VANCOMYCIN HCL 10 G IV SOLR
1250.0000 mg | Freq: Once | INTRAVENOUS | Status: AC
  Administered 2019-03-06: 15:00:00 1250 mg via INTRAVENOUS
  Filled 2019-03-06: qty 1250

## 2019-03-06 MED ORDER — SODIUM CHLORIDE 0.9 % IV BOLUS (SEPSIS)
1000.0000 mL | Freq: Once | INTRAVENOUS | Status: AC
  Administered 2019-03-06: 1000 mL via INTRAVENOUS

## 2019-03-06 MED ORDER — ACETAMINOPHEN 325 MG PO TABS: 650.0000 mg | ORAL_TABLET | Freq: Four times a day (QID) | ORAL | Status: DC | PRN

## 2019-03-06 MED ORDER — PIPERACILLIN-TAZOBACTAM 3.375 G IVPB 30 MIN
3.3750 g | Freq: Once | INTRAVENOUS | Status: AC
  Administered 2019-03-06: 14:00:00 3.375 g via INTRAVENOUS
  Filled 2019-03-06: qty 50

## 2019-03-06 MED ORDER — SODIUM CHLORIDE 0.9 % IV BOLUS (SEPSIS)
1000.0000 mL | Freq: Once | INTRAVENOUS | Status: AC
  Administered 2019-03-06: 15:00:00 1000 mL via INTRAVENOUS

## 2019-03-06 NOTE — Progress Notes (Signed)
Pt trans ported ct and back to room without any incident

## 2019-03-06 NOTE — Consult Note (Signed)
CODE SEPSIS - PHARMACY COMMUNICATION  **Broad Spectrum Antibiotics should be administered within 1 hour of Sepsis diagnosis**  Time Code Sepsis Called/Page Received: 1310  Antibiotics Ordered: vancomycin and Zosyn  Time of 1st antibiotic administration: 1405  Additional action taken by pharmacy: none required  If necessary, Name of Provider/Nurse Contacted: N/A    Lowella Bandy ,PharmD Clinical Pharmacist  03/10/2019  2:14 PM

## 2019-03-06 NOTE — ED Notes (Signed)
Lactic acid of 4.7 reported by lab.

## 2019-03-06 NOTE — ED Provider Notes (Addendum)
[facility] Emergency Department Provider Note  ____________________________________________   I have reviewed the triage vital signs and the nursing notes. Where available I have reviewed prior notes and, if possible and indicated, outside hospital notes.    HISTORY  Chief Complaint Respiratory Distress    HPI Cheryl [name] is a 68 y.o. female with a history of aspiration pneumonitis, DVT in the past anemia and the other chronic medical problems listed below, recently admitted and discharged from the hospital for a pneumonia admission.  Was doing okay until yesterday.  According to her husband, she cannot give a history,slept more yest fever low sats today and he states he was checking her oxygen saturations and the highest he got was 80.  He ultimately called 911, when EMS got there her sats were in the 70s and her pressure was low they gave her a small bolus of IV fluid and began to administer supplemental oxygen got her sats up some with that.  Still in the 80s however upon arrival.  Patient herself cannot give a history.  Husband states that her fever began today, T-max was 104 at home.  She states she did vomit yesterday he does not think she aspirated at that time.  I did talk to him about CODE STATUS, he does not want the patient to have any CPR, and does not want a prolonged period of time on the respirator but was okay with antibiotics and other medications prior to that.  He is still thinking about exactly what he wants but that he is clear about this.  Report himself to be her power of attorney.  Level 5 chart caveat; no further history available due to patient status.   Past Medical History:  Diagnosis Date  . Acute encephalopathy 06/18/2018  . Acute postoperative pain 04/14/2017  . Allergy   . Anemia   . Aspiration pneumonitis ([place]) 11/24/2015  . Asthma   . Chronic pain   . DVT (deep venous thrombosis) ([place])   . Dysrhythmia   . Encephalopathy     MULTIPLE TIMES  . GERD (gastroesophageal reflux disease)   . Hyperlipidemia   . Hypertension   . Kyphoscoliosis deformity of spine   . Metabolic encephalopathy 7/65/4650  . Migraines   . Neuropathy 2010  . NSTEMI (non-ST elevated myocardial infarction) ([place])   . Oxygen deficiency   . Pneumonia   . Pneumonia 11/19/2015  . Pneumonia 10/2016  . Pneumonia    aspiration 2018- 4 times in last year  . Rib fracture 09/19/2017   Displace right 9th rib fracture.   . Wears dentures    full upper and lower    Patient Active Problem List   Diagnosis Date Noted  . Sepsis ([place]) 02/27/2019  . Diastolic dysfunction 35/46/5681  . Tobacco abuse 02/12/2019  . Aspiration pneumonia ([place]) 01/01/2019  . Hiatal hernia 12/24/2018  . Chronic lower extremity pain (Primary Area of Pain) (Bilateral) (L>R) 09/03/2018  . Chronic hip pain (Fourth Area of Pain) (Bilateral) (L>R) 09/03/2018  . Osteoarthritis of hip (Bilateral) 09/03/2018  . Chronic sacroiliac joint pain [facility] Area of Pain) (Bilateral) (L>R) 09/03/2018  . Sacroiliac joint dysfunction (Bilateral) 09/03/2018  . Spondylosis without myelopathy or radiculopathy, lumbosacral region 09/03/2018  . Other specified dorsopathies, sacral and sacrococcygeal region 09/03/2018  . DDD (degenerative disc disease), lumbar 09/02/2018  . Abnormal CT scan, lumbar spine (08/04/2017) 09/02/2018  . Levoscoliosis (Severe) 09/02/2018  . 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  Lumbar Grade 1 Anterolisthesis of L3 over L4 and L4 over L5 09/02/2018  . Lumbar foraminal stenosis (Bilateral: L3-4, L4-5) (Severe Right L4-5) 09/02/2018  . Lumbar facet arthropathy (Bilateral) 09/02/2018  . Lumbar facet osteoarthritis (Bilateral) 09/02/2018  . Lumbar spondylosis 09/02/2018  . Cervical spondylosis 09/02/2018  . Thoracic spondylosis 09/02/2018  . DDD (degenerative disc disease), thoracic 09/02/2018  . DDD (degenerative disc  disease), cervical 09/02/2018  . Spondylosis without myelopathy or radiculopathy, lumbar region 09/01/2018  . Anorexia 07/07/2018  . 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 Goals of care, counseling/discussion   . Palliative care by specialist   . Iron deficiency anemia 06/09/2018  . Gastritis without bleeding   . Hypoxia 05/14/2018  . Generalized weakness 02/08/2018  . Marijuana use 06/05/2017  . Hypokalemia 12/23/2016  . Protein-calorie malnutrition, severe 11/06/2016  . Nausea with vomiting   . Lumbar facet syndrome (Bilateral) (L>R) 03/20/2016  . Opiate use (160 MME/Day) 02/28/2016  . Anemia 02/28/2016  . Osteoarthrosis 12/18/2015  . Long term current use of anticoagulant therapy (Plavix) 12/18/2015  . Long term current use of opiate analgesic 12/04/2015  . Fibromyalgia 12/04/2015  . Dysphagia 11/24/2015  . Abnormal mammogram of right breast 10/11/2015  . Dilated intrahepatic bile duct 10/11/2015  . Chronic lumbar radicular pain (Bilateral) (L>R) (L5) 09/04/2015  . Chronic low back pain (Secondary Area of Pain) (Bilateral) (L>R) 09/04/2015  . Encounter for therapeutic drug level monitoring 09/04/2015  . Chronic pain syndrome 09/04/2015  . Platelet inhibition due to Plavix 09/04/2015  . COPD with acute exacerbation ([place]) 07/31/2015  . Airway hyperreactivity 07/03/2015  . Chronic thoracic back pain 07/03/2015  . Excessive falling 07/03/2015  . Alteration in bowel elimination: incontinence 07/03/2015  . Insomnia 07/03/2015  . Decreased testosterone level 07/03/2015  . Leg weakness 07/03/2015  . Menopausal symptom 07/03/2015  . Migraine 07/03/2015  . Neuropathy ([place]) 07/03/2015  . Fecal occult blood test positive 07/03/2015  . OP (osteoporosis) 07/03/2015  . Panic disorder 07/03/2015  . Shortness of breath 07/03/2015  . Compulsive tobacco user syndrome 07/03/2015  . Urinary incontinence 07/03/2015  . Essential hypertension 06/06/2015  . GERD (gastroesophageal  reflux disease) 06/06/2015  . Hyperlipemia 06/06/2015  . Peripheral nerve disease 04/13/2014  . Anxiety 02/10/2014  . Coronary artery disease 02/10/2014  . Peripheral vascular disease ([place]) 02/10/2014  . Vitamin D deficiency 10/16/2009    Past Surgical History:  Procedure Laterality Date  . ABDOMINAL HYSTERECTOMY  1975   Bilaterl Oophorectomy; [place] to IUD infection  . abdomnal aortic stent  05/30/2008   Dr. Quay Burow  . APPENDECTOMY    . APPENDECTOMY    . BACK SURGERY    . cardiac catherization  10/31/2009  . CARDIAC CATHETERIZATION    . CATARACT EXTRACTION W/PHACO Left 08/20/2018   Procedure: CATARACT EXTRACTION PHACO AND INTRAOCULAR LENS PLACEMENT (IOC);  Surgeon: Eulogio Bear, MD;  Location: [facility] ORS;  Service: Ophthalmology;  Laterality: Left;  [place] 00:45.1 AP% 12.3 CDE 5.54 FLUID PACK LOT # [id number]  . CATARACT EXTRACTION W/PHACO Right 09/23/2018   Procedure: CATARACT EXTRACTION PHACO AND INTRAOCULAR LENS PLACEMENT ([place]);  Surgeon: Eulogio Bear, MD;  Location: [facility] ORS;  Service: Ophthalmology;  Laterality: Right;  [place] 00:45.0 CDE 5.19 Fluid Pack lot # [id number]  . CERVICAL FUSION  C5 - 6/C6-7  . CHOLECYSTECTOMY  1972  . COLONOSCOPY WITH PROPOFOL N/A 07/27/2015   Procedure: COLONOSCOPY WITH PROPOFOL;  Surgeon: Hulen Luster, MD;  Location: [facility] ENDOSCOPY;  Service: Gastroenterology;  Laterality: N/A;  . ESOPHAGOGASTRODUODENOSCOPY (EGD) WITH PROPOFOL N/A 07/27/2015   Procedure: ESOPHAGOGASTRODUODENOSCOPY (EGD) WITH PROPOFOL;  Surgeon: Hulen Luster, MD;  Location: [facility] ENDOSCOPY;  Service: Gastroenterology;  Laterality: N/A;  . ESOPHAGOGASTRODUODENOSCOPY (EGD) WITH PROPOFOL N/A 05/19/2018   Procedure: ESOPHAGOGASTRODUODENOSCOPY (EGD) WITH PROPOFOL;  Surgeon: Lucilla Lame, MD;  Location[facility] ENDOSCOPY;  Service: Endoscopy;  Laterality: N/A;  . EYE SURGERY    . FOOT SURGERY Bilateral    5-6 years per patient  . SPINE SURGERY    . TONSILLECTOMY    . VASCULAR SURGERY     LEG  STENTS    Prior to Admission medications   Medication Sig Start Date End Date Taking? Authorizing Provider  acetaminophen (TYLENOL) 325 MG tablet Take 2 tablets (650 mg total) by mouth every 6 (six) hours as needed for mild pain (or Fever >/= 101). 05/21/18   Loletha Grayer, MD  albuterol (PROVENTIL) (2.5 MG/3ML) 0.083% nebulizer solution Take 3 mLs (2.5 mg total) by nebulization every 4 (four) hours as needed for wheezing. 02/10/19   Birdie Sons, MD  alendronate (FOSAMAX) 70 MG tablet Take 1 tablet (70 mg total) by mouth once a week. Take with a full glass of water on an empty stomach. 12/04/18   Birdie Sons, MD  ALPRAZolam Duanne Moron) 0.5 MG tablet Take 0.5-1 tablets (0.25-0.5 mg total) by mouth at bedtime as needed for anxiety. 02/02/19   Birdie Sons, MD  amoxicillin-clavulanate (AUGMENTIN) 875-125 MG tablet Take 1 tablet by mouth every 12 (twelve) hours for 5 days. 03/02/19 03/07/19  Otila Back, MD  atorvastatin (LIPITOR) 40 MG tablet Take 1 tablet (40 mg total) by mouth daily at 6 PM. 02/09/19   Fisher, Kirstie Peri, MD  baclofen (LIORESAL) 10 MG tablet Take 1 tablet (10 mg total) by mouth 3 (three) times daily as needed for muscle spasms. 08/17/18   Triplett, Cari B, FNP  BELSOMRA 5 MG TABS Take 1 tablet by mouth at bedtime as needed (for insomnia). Patient taking differently: Take 5 mg by mouth at bedtime.  11/16/18   Birdie Sons, MD  benzonatate (TESSALON PERLES) 100 MG capsule Take 1 capsule (100 mg total) by mouth 3 (three) times daily as needed for cough. 02/03/19   Demetrios Loll, MD  buPROPion [facility] SR) 150 MG 12 hr tablet 1 tablet daily for 3 days, then 1 tablet twice daily. Stop smoking 14 days after starting medication Patient taking differently: Take 150 mg by mouth 2 (two) times daily.  02/12/19 06/12/19  Birdie Sons, MD  celecoxib (CELEBREX) 100 MG capsule Take 100 mg by mouth 2 (two) times daily as needed for mild pain or moderate pain.  11/23/18   [provider]  clopidogrel (PLAVIX) 75 MG tablet Take 75 mg by mouth daily.    [provider]  diphenoxylate-atropine (LOMOTIL) 2.5-0.025 MG tablet Take 1 tablet by mouth as needed for diarrhea or loose stools. 06/10/18   [provider]  DULoxetine (CYMBALTA) 30 MG capsule Take 30 mg by mouth daily. 01/12/19   [provider]  estradiol (ESTRACE) 0.5 MG tablet Take 1 tablet (0.5 mg total) by mouth every other day. 09/09/18   Birdie Sons, MD  fentaNYL (DURAGESIC) 50 MCG/HR Place 1 patch onto the skin every 3 (three) days for 30 days. 05/01/19 05/31/19  Vevelyn Francois, NP  fentaNYL (DURAGESIC) 50 MCG/HR Place 1 patch onto the skin every 3 (three) days for 30 days. 04/01/19 05/01/19  Vevelyn Francois,  NP  fentaNYL (DURAGESIC) 50 MCG/HR Place 1 patch onto the skin every 3 (three) days for 30 days. 03/02/19 04/01/19  Vevelyn Francois, NP  ferrous sulfate 325 (65 FE) MG tablet Take 1 tablet (325 mg total) by mouth 2 (two) times daily with a meal. 01/11/19   Birdie Sons, MD  Fluticasone-Salmeterol (ADVAIR DISKUS) 250-50 MCG/DOSE AEPB TAKE ONE PUFF TWICE DAILY. Patient taking differently: Inhale 1 puff into the lungs 2 (two) times daily.  09/28/18   Birdie Sons, MD  memantine (NAMENDA) 5 MG tablet Take 5 mg by mouth 2 (two) times daily.     [provider]  mirtazapine (REMERON SOL-TAB) 15 MG disintegrating tablet Take 1 tablet (15 mg total) by mouth at bedtime. 01/11/19   Birdie Sons, MD  NARCAN 4 MG/0.1ML LIQD nasal spray kit Place 0.4 mg into the nose daily as needed (for accidental overdose.).  09/11/17   [provider]  ondansetron (ZOFRAN) 4 MG tablet Take 1 tablet (4 mg total) by mouth as needed. Patient taking differently: Take 4 mg by mouth as needed for nausea or vomiting.  02/12/19   Birdie Sons, MD  pantoprazole (PROTONIX) 40 MG tablet Take 40-80 mg by mouth See admin instructions. Take 1 tablet (40 mg) by mouth every morning and take 1 additional tablet  ('40mg'$ ) every evening as needed for heartburn    [provider]  potassium chloride (K-DUR,KLOR-CON) 10 MEQ tablet Take 10 mEq by mouth daily. 12/31/18   [provider]  pregabalin (LYRICA) 300 MG capsule Take 300 mg by mouth 2 (two) times daily.    [provider]  silver sulfADIAZINE (SILVADENE) 1 % cream Apply 1 application topically daily as needed (leg infection).     [provider]  triamcinolone (KENALOG) 0.025 % ointment Apply 1 application topically 2 (two) times daily. Patient taking differently: Apply 1 application topically 2 (two) times daily as needed (cracking skin).  06/15/18   Birdie Sons, MD  vitamin B-12 (CYANOCOBALAMIN) 1000 MCG tablet Take 1 tablet (1,000 mcg total) by mouth daily. 05/21/18   Loletha Grayer, MD    Allergies Percocet [oxycodone-acetaminophen]; Aspirin; Codeine; Propoxyphene; and Sulfa antibiotics  Family History  Problem Relation Age of Onset  . Cancer Mother   . Arthritis Mother   . Heart disease Mother   . Diabetes Mother        mellitus, type 2  . Heart disease Father   . Diabetes Sister   . Cancer Brother   . Cancer Brother        lung  . Diabetes Brother     Social History Social History   Tobacco Use  . Smoking status: Current Every Day Smoker    Years: 50.00    Types: Cigarettes  . Smokeless tobacco: Never Used  . Tobacco comment: Previously smoked 2 ppd  Substance Use Topics  . Alcohol use: No    Alcohol/week: 0.0 standard drinks  . Drug use: No    Review of Systems Level 5 chart caveat; no further history available due to patient status.  ____________________________________________   PHYSICAL EXAM:  VITAL SIGNS: ED Triage Vitals  Enc Vitals Group     BP 03/10/2019 1306 123/83     Pulse Rate 03/25/2019 1306 (!) 130     Resp 03/18/2019 1330 20     Temp 02/24/2019 1356 (!) 101 F (38.3 C)     Temp Source 03/18/2019 1356 Axillary     SpO2 03/15/2019  1330 91 %     Weight 03/01/2019 1402  128 lb 8.5 oz (58.3 kg)     Height 03/11/2019 1402 '4\' 10"'$  (1.473 m)     Head Circumference --      Peak Flow --      Pain Score --      Pain Loc --      Pain Edu? --      Excl. in [place]? --     Constitutional: Patient being bagged by EMS normally responsive oral airway in place Eyes: Conjunctivae are normal Head: Atraumatic HEENT: No congestion/rhinnorhea. Mucous membranes are moist.  Vomitus in the oropharynx. Neck:   No meningismus Cardiovascular: Tachycardia noted, regular rhythm. Grossly normal heart sounds Respiratory: Supplemental oxygenation provided by EMS with a bag, decreased breath sounds and rhonchi appreciated on the right Abdominal: Soft and nontender. No distention. No guarding no rebound  Musculoskeletal: No lower extremity tenderness, no upper extremity tenderness. No joint effusions, no DVT signs strong distal pulses no edema Neurologic: Unresponsive, opens her eyes a little bit of pain Skin:  Skin is warm, dry and intact. No rash noted.   ____________________________________________   LABS (all labs ordered are listed, but only abnormal results are displayed)  Labs Reviewed  CBC WITH DIFFERENTIAL/PLATELET - Abnormal; Notable for the following components:      Result Value   RBC 3.79 (*)    Hemoglobin 11.6 (*)    MCV 101.1 (*)    All other components within normal limits  BLOOD GAS, ARTERIAL - Abnormal; Notable for the following components:   pH, Arterial 7.30 (*)    pCO2 arterial 54 (*)    pO2, Arterial 64 (*)    All other components within normal limits  CULTURE, BLOOD (ROUTINE X 2)  CULTURE, BLOOD (ROUTINE X 2)  NOVEL CORONAVIRUS, NAA (HOSPITAL ORDER, SEND-OUT TO REF LAB)  URINE CULTURE  LACTIC ACID, PLASMA  LACTIC ACID, PLASMA  COMPREHENSIVE METABOLIC PANEL  PROCALCITONIN  PROTIME-INR  BRAIN NATRIURETIC PEPTIDE  URINALYSIS, COMPLETE (UACMP) WITH MICROSCOPIC  I-STAT ARTERIAL BLOOD GAS, ED    Pertinent labs  results that were available during my  care of the patient were reviewed by me and considered in my medical decision making (see chart for details). ____________________________________________  EKG  I personally interpreted any EKGs ordered by me or triage Sinus tach rate 123, normal axis ST depression noted inferiorly and laterally, no significant change from prior ____________________________________________  RADIOLOGY  Pertinent labs & imaging results that were available during my care of the patient were reviewed by me and considered in my medical decision making (see chart for details). If possible, patient and/or family made aware of any abnormal findings.  No results found. ____________________________________________    PROCEDURES  Procedure(s) performed: None  Procedure Name: Intubation Date/Time: 03/20/2019 2:18 PM Performed by: Schuyler Amor, MD Pre-anesthesia Checklist: Patient identified Oxygen Delivery Method: Non-rebreather mask Preoxygenation: Pre-oxygenation with 100% oxygen Induction Type: Rapid sequence Laryngoscope Size: Glidescope and 3 Grade View: Grade I Tube size: 7.5 mm Number of attempts: 1 Placement Confirmation: ETT inserted through vocal cords under direct vision,  Positive ETCO2 and Breath sounds checked- equal and bilateral Secured at: 23 cm Tube secured with: ETT holder       Critical Care performed: CRITICAL CARE Performed by: Schuyler Amor   Total critical care time: 55 minutes  Critical care time was exclusive of separately billable procedures and treating other patients.  Critical care was necessary to treat or prevent  imminent or life-threatening deterioration.  Critical care was time spent personally by me on the following activities: development of treatment plan with patient and/or surrogate as well as nursing, discussions with consultants, evaluation of patient's response to treatment, examination of patient, obtaining history from patient or surrogate,  ordering and performing treatments and interventions, ordering and review of laboratory studies, ordering and review of radiographic studies, pulse oximetry and re-evaluation of patient's condition.   ____________________________________________   INITIAL IMPRESSION / ASSESSMENT AND PLAN / ED COURSE  Pertinent labs & imaging results that were available during my care of the patient were reviewed by me and considered in my medical decision making (see chart for details).  Patient with acute respiratory failure full code upon arrival per EMS and per prior notes.  I therefore intubated the patient stat.  That was performed with no complications.  Oxygenation then improved.  Initial sats were in the 70s with a good waveform and pressures were in the 60s were giving her sepsis level fluids antibiotics for aspiration/hospital-acquired pneumonia, I talked to her husband he does not wish her to have CPR and I think this is reasonable.  We are beginning fluid hydration her blood pressures are coming up we are admitting her to the hospitalist service in the ICU.  covid testing d/c by request of admitting service.  ----------------------------------------- 3:31 PM on 03/19/2019 -----------------------------------------  Pt has been under care of hospitalist service as of the end of my shift. Singed out to dr. Jacqualine Code for decompensation while in ED. Pressure responding slowly to ivf.   ____________________________________________   FINAL CLINICAL IMPRESSION(S) / ED DIAGNOSES  Final diagnoses:  SOB (shortness of breath)      This chart was dictated using voice recognition software.  Despite best efforts to proofread,  errors can occur which can change meaning.      Schuyler Amor, MD 03/11/2019 1420    Schuyler Amor, MD 03/17/2019 1445    Schuyler Amor, MD 03/18/2019 1446    Schuyler Amor, MD 02/28/2019 [phone]

## 2019-03-06 NOTE — Consult Note (Signed)
PCCM CONSULT NOTE  INITIAL PRESENTATION: 68 y.o. F smoker with COPD, recurrent aspiration pneumonias and multiple other chronic medical problems, recently discharged (03/02/2019) from [facility] with discharge diagnosis of HCAP and sepsis.  Return to [facility] ED via EMS with fever, AMS, hypoxemia.  Intubated in ED. after intubation, reportedly ETS specimen was consistent with gastric contents.  Patient is now DNR in event of cardiac arrest per ER notes   MAJOR EVENTS/TEST RESULTS: 04/11 admission as above  INDWELLING DEVICES:: ETT 04/11 >>   MICRO DATA: MRSA PCR 4/11 >> negative Urine 4/11 >>  Resp 4/11 >>  Blood 4/11 >>  PCT: 105,    ANTIMICROBIALS:  Pip-tazo 04/11 >>    HPI: Level 5 caveat  Past Medical History:  Diagnosis Date  . Acute encephalopathy 06/18/2018  . Acute postoperative pain 04/14/2017  . Allergy   . Anemia   . Aspiration pneumonitis ([place]) 11/24/2015  . Asthma   . Chronic pain   . DVT (deep venous thrombosis) ([place])   . Dysrhythmia   . Encephalopathy    MULTIPLE TIMES  . GERD (gastroesophageal reflux disease)   . Hyperlipidemia   . Hypertension   . Kyphoscoliosis deformity of spine   . Metabolic encephalopathy 01/30/6577  . Migraines   . Neuropathy 2010  . NSTEMI (non-ST elevated myocardial infarction) ([place])   . Oxygen deficiency   . Pneumonia   . Pneumonia 11/19/2015  . Pneumonia 10/2016  . Pneumonia    aspiration 2018- 4 times in last year  . Rib fracture 09/19/2017   Displace right 9th rib fracture.   . Wears dentures    full upper and lower   Social History   Socioeconomic History  . Marital status: Married    Spouse name: Shaquanna Lycan  . Number of children: 6  . Years of education: Not on file  . Highest education level: Not on file  Occupational History  . Occupation: Disabled  Social Needs  . Financial resource strain: Not on file  . Food insecurity:    Worry: Not on file    Inability: Not on file  . Transportation needs:    Medical:  No    Non-medical: No  Tobacco Use  . Smoking status: Current Every Day Smoker    Years: 50.00    Types: Cigarettes  . Smokeless tobacco: Never Used  . Tobacco comment: Previously smoked 2 ppd  Substance and Sexual Activity  . Alcohol use: No    Alcohol/week: 0.0 standard drinks  . Drug use: No  . Sexual activity: Never  Lifestyle  . Physical activity:    Days per week: Not on file    Minutes per session: Not on file  . Stress: Not on file  Relationships  . Social connections:    Talks on phone: Not on file    Gets together: Not on file    Attends religious service: Not on file    Active member of club or organization: Not on file    Attends meetings of clubs or organizations: Not on file    Relationship status: Married  . Intimate partner violence:    Fear of current or ex partner: Not on file    Emotionally abused: Not on file    Physically abused: Not on file    Forced sexual activity: Not on file  Other Topics Concern  . Not on file  Social History Narrative  . Not on file   Family History  Problem Relation Age of Onset  .  Cancer Mother   . Arthritis Mother   . Heart disease Mother   . Diabetes Mother        mellitus, type 2  . Heart disease Father   . Diabetes Sister   . Cancer Brother   . Cancer Brother        lung  . Diabetes Brother     SUBJ:    OBJ: Vitals:   02/25/2019 1620 02/24/2019 1630 03/21/2019 1635 03/24/2019 1640  BP: (!) 80/41 (!) 70/32 (!) 74/37 (!) 84/36  Pulse: (!) 112 (!) 107 (!) 106 (!) 107  Resp: '16 17 18 18  '$ Temp:      TempSrc:      SpO2: 94% 95% (!) 87% 94%  Weight:      Height:        Vent Mode: PRVC FiO2 (%):  [99 %-100 %] 100 % Set Rate:  [16 bmp] 16 bmp Vt Set:  [450 mL] 450 mL PEEP:  [5 cmH20] 5 cmH20   Intake/Output Summary (Last 24 hours) at 02/26/2019 1658 Last data filed at 03/02/2019 1628 Gross per 24 hour  Intake 1050 ml  Output 150 ml  Net 900 ml     Gen: Very frail appearing, intubated, minimally  responsive, synchronous with ventilator HEENT: NCAT, sclerae white, edentulous Neck: No JVD noted Lungs: Bronchial breath sounds in RLL, distant prolonged expiratory wheezes Cardiovascular: Mildly tachycardic, regular, no M Abdomen: Soft, nontender, normal BS Ext: Warm, chronic stasis changes, no edema Neuro: Cranial nerves grossly intact, no spontaneous movement, no withdrawal of extremities Skin: Limited exam, no lesions noted   BMP Latest Ref Rng & Units 02/24/2019 03/02/2019 03/01/2019  Glucose 70 - 99 mg/dL 79 66(L) 56(L)  BUN 8 - 23 mg/dL 29(H) 8 13  Creatinine 0.44 - 1.00 mg/dL 2.98(H) 0.69 0.67  BUN/Creat Ratio 12 - 28 - - -  Sodium 135 - 145 mmol/L 138 140 141  Potassium 3.5 - 5.1 mmol/L 3.1(L) 4.0 4.2  Chloride 98 - 111 mmol/L 102 107 108  CO2 22 - 32 mmol/L 25 20(L) 21(L)  Calcium 8.9 - 10.3 mg/dL 8.2(L) 8.1(L) 7.7(L)    CBC Latest Ref Rng & Units 03/09/2019 03/01/2019 02/28/2019  WBC 4.0 - 10.5 K/uL 2.3(L) 5.9 8.4  Hemoglobin 12.0 - 15.0 g/dL 11.6(L) 9.6(L) 9.0(L)  Hematocrit 36.0 - 46.0 % 38.3 31.7(L) 29.6(L)  Platelets 150 - 400 K/uL 318 215 197    Hepatic Function Latest Ref Rng & Units 03/02/2019 02/27/2019 02/01/2019  Total Protein 6.5 - 8.1 g/dL 6.2(L) 6.6 6.5  Albumin 3.5 - 5.0 g/dL 3.0(L) 3.6 3.6  AST 15 - 41 U/L '30 15 20  '$ ALT 0 - 44 U/L '11 9 18  '$ Alk Phosphatase 38 - 126 U/L 51 66 59  Total Bilirubin 0.3 - 1.2 mg/dL 0.7 0.9 0.9  Bilirubin, Direct 0.1 - 0.5 mg/dL - - -    Cardiac Panel (last 3 results) Recent Labs    03/21/2019 1339  TROPONINI 0.07*    CXR: RLL infiltrate consistent with pneumonia    IMPRESSION/PLAN: PULMONARY: Acute hypoxemic respiratory failure Underlying severe COPD History of recurrent aspiration pneumonia  New RLL pneumonia consistent with aspiration  Vent settings established Vent bundle implemented Daily SBT as indicated Nebulized steroids and bronchodilators  CARDIOVASCULAR: Septic shock Lactic acidosis History of  CAD Dilated cardiomyopathy (LVEF 45-50% by echo 11/03/2018)  MAP goal > 65 mmHg Received volume resuscitation in ED Norepinephrine infusion ordered Empiric stress dose steroids with hydrocortisone   RENAL: AKI  Hypokalemia  Monitor BMET intermittently Monitor I/Os Correct electrolytes as indicated Not a candidate for hemodialysis  GI/NUTRITION: Severe protein-calorie malnutrition  SUP: IV pantoprazole Consider TF protocol 4/12  INFECTIOUS DISEASE: Severe sepsis Right lower lobe pneumonia, suspected aspiration pneumonia Markedly elevated procalcitonin  Monitor temp, WBC count Micro and abx as above   ENDOCRINE: No acute issues No prior history of diabetes  Monitor CBGs Consider SSI for CBGs >180  HEME: Mild anemia without acute blood loss Leukopenia likely due to severe sepsis  DVT px: SQ heparin Monitor CBC intermittently Transfuse per usual guidelines  NEURO: Acute encephalopathy due to severe sepsis Chronic opioid use  PAD protocol: Propofol infusion, intermittent fentanyl RASS goal -1, -2  CCM time: 60 mins The above time includes time spent in consultation with patient and/or family members and reviewing care plan on multidisciplinary rounds  Merton Border, MD PCCM service Mobile [phone] Pager [phone] 03/25/2019 4:58 PM

## 2019-03-06 NOTE — Progress Notes (Signed)
Pharmacy Antibiotic Note  Cheryl [name] is a 68 y.o. female admitted on 03/02/2019 with pneumonia.  Pharmacy has been consulted for Zosyn dosing.  Plan: Zosyn 2.25gm every 6 hours Patient initially ordered vancomycin loading dose and consult. It was determined patient was MRSA PCR negative at last admission on 03/01/19 and vancomycin was discontinued.  Height: 4\' 10"  (147.3 cm) Weight: 128 lb 8.5 oz (58.3 kg) IBW/kg (Calculated) : 40.9  Temp (24hrs), Avg:101 F (38.3 C), Min:101 F (38.3 C), Max:101 F (38.3 C)  Recent Labs  Lab 02/27/19 1807 02/28/19 0539 03/01/19 0503 03/02/19 0242 03/03/2019 1339  WBC  --  8.4 5.9  --  2.3*  CREATININE  --  0.78 0.67 0.69 2.98*  LATICACIDVEN 1.3  --   --   --  4.7*    Estimated Creatinine Clearance: 13.3 mL/min (A) (by C-G formula based on SCr of 2.98 mg/dL (H)).    Allergies  Allergen Reactions  . Percocet [Oxycodone-Acetaminophen] Hives and Rash  . Aspirin Nausea And Vomiting and Other (See Comments)  . Codeine Nausea And Vomiting, Nausea Only and Other (See Comments)  . Propoxyphene Nausea Only and Other (See Comments)  . Sulfa Antibiotics Rash and Other (See Comments)    Antimicrobials this admission: 4/11 vancomycin >>  4/11 zosyn >>   Microbiology results: 4/1 BCx: pending 4/11 UCx: pending  4/6 MRSA PCR: negative  Thank you for allowing pharmacy to be a part of this patient's care.  Yumalay Circle 02/27/2019 3:17 PM

## 2019-03-06 NOTE — ED Notes (Signed)
Patient remains calm, sedated. No activity noted with two unsuccessful IV attempts. Fluids and IV medication infusing without difficulty. IVs are patent.. OG draining green bile in cannister. Urine in Foley bag is amber and hazy.

## 2019-03-06 NOTE — H&P (Addendum)
[facility] [place] NAME: Cheryl [name]    MR#:  [id number]  DATE OF BIRTH:  11-Dec-1950  DATE OF ADMISSION:  03/02/2019  PRIMARY CARE PHYSICIAN: Birdie Sons, MD   REQUESTING/REFERRING PHYSICIAN: Dr. Burlene Arnt  CHIEF COMPLAINT:   Chief Complaint  Patient presents with  . Respiratory Distress    HISTORY OF PRESENT ILLNESS:  Cheryl [name]  is a 68 y.o. female with a known history of COPD, CAD, chronic systolic CHF, hypertension, chronic respiratory failure, recenty discharged after being treated for healthcare acquired pneumonia on 03/02/2019 returns due to worsening shortness of breath and hypoxia.  Patient had to be intubated.  Has been afebrile, hypotensive.  Initially COVID-19 was considered in differential but this seems unlikely as patient has not left home since discharge and COVID-19 testing on 02/27/2019 has been negative.  She has received 1 L normal saline along with broad-spectrum antibiotics. Will be admitted to ICU.  ER physician has discussed with husband who has requested aggressive care including full vent support and pressor support if needed.  But patient is DNR- no CPR.  PAST MEDICAL HISTORY:   Past Medical History:  Diagnosis Date  . Acute encephalopathy 06/18/2018  . Acute postoperative pain 04/14/2017  . Allergy   . Anemia   . Aspiration pneumonitis ([place]) 11/24/2015  . Asthma   . Chronic pain   . DVT (deep venous thrombosis) ([place])   . Dysrhythmia   . Encephalopathy    MULTIPLE TIMES  . GERD (gastroesophageal reflux disease)   . Hyperlipidemia   . Hypertension   . Kyphoscoliosis deformity of spine   . Metabolic encephalopathy 03/03/8118  . Migraines   . Neuropathy 2010  . NSTEMI (non-ST elevated myocardial infarction) ([place])   . Oxygen deficiency   . Pneumonia   . Pneumonia 11/19/2015  . Pneumonia 10/2016  . Pneumonia    aspiration 2018- 4 times in last year  . Rib fracture 09/19/2017   Displace right 9th rib  fracture.   . Wears dentures    full upper and lower    PAST SURGICAL HISTORY:   Past Surgical History:  Procedure Laterality Date  . ABDOMINAL HYSTERECTOMY  1975   Bilaterl Oophorectomy; Dur to IUD infection  . abdomnal aortic stent  05/30/2008   Dr. Quay Burow  . APPENDECTOMY    . APPENDECTOMY    . BACK SURGERY    . cardiac catherization  10/31/2009  . CARDIAC CATHETERIZATION    . CATARACT EXTRACTION W/PHACO Left 08/20/2018   Procedure: CATARACT EXTRACTION PHACO AND INTRAOCULAR LENS PLACEMENT (IOC);  Surgeon: Eulogio Bear, MD;  Location: [facility] ORS;  Service: Ophthalmology;  Laterality: Left;  [place] 00:45.1 AP% 12.3 CDE 5.54 FLUID PACK LOT # [id number]  . CATARACT EXTRACTION W/PHACO Right 09/23/2018   Procedure: CATARACT EXTRACTION PHACO AND INTRAOCULAR LENS PLACEMENT ([place]);  Surgeon: Eulogio Bear, MD;  Location: [facility] ORS;  Service: Ophthalmology;  Laterality: Right;  [place] 00:45.0 CDE 5.19 Fluid Pack lot # [id number]  . CERVICAL FUSION  C5 - 6/C6-7  . CHOLECYSTECTOMY  1972  . COLONOSCOPY WITH PROPOFOL N/A 07/27/2015   Procedure: COLONOSCOPY WITH PROPOFOL;  Surgeon: Hulen Luster, MD;  Location[facility] ENDOSCOPY;  Service: Gastroenterology;  Laterality: N/A;  . ESOPHAGOGASTRODUODENOSCOPY (EGD) WITH PROPOFOL N/A 07/27/2015   Procedure: ESOPHAGOGASTRODUODENOSCOPY (EGD) WITH PROPOFOL;  Surgeon: Hulen Luster, MD;  Location: [facility] ENDOSCOPY;  Service: Gastroenterology;  Laterality: N/A;  . ESOPHAGOGASTRODUODENOSCOPY (EGD) WITH PROPOFOL N/A 05/19/2018  Procedure: ESOPHAGOGASTRODUODENOSCOPY (EGD) WITH PROPOFOL;  Surgeon: Lucilla Lame, MD;  Location: [facility] ENDOSCOPY;  Service: Endoscopy;  Laterality: N/A;  . EYE SURGERY    . FOOT SURGERY Bilateral    5-6 years per patient  . SPINE SURGERY    . TONSILLECTOMY    . VASCULAR SURGERY     LEG STENTS    SOCIAL HISTORY:   Social History   Tobacco Use  . Smoking status: Current Every Day Smoker    Years: 50.00    Types: Cigarettes  .  Smokeless tobacco: Never Used  . Tobacco comment: Previously smoked 2 ppd  Substance Use Topics  . Alcohol use: No    Alcohol/week: 0.0 standard drinks    FAMILY HISTORY:   Family History  Problem Relation Age of Onset  . Cancer Mother   . Arthritis Mother   . Heart disease Mother   . Diabetes Mother        mellitus, type 2  . Heart disease Father   . Diabetes Sister   . Cancer Brother   . Cancer Brother        lung  . Diabetes Brother     DRUG ALLERGIES:   Allergies  Allergen Reactions  . Percocet [Oxycodone-Acetaminophen] Hives and Rash  . Aspirin Nausea And Vomiting and Other (See Comments)  . Codeine Nausea And Vomiting, Nausea Only and Other (See Comments)  . Propoxyphene Nausea Only and Other (See Comments)  . Sulfa Antibiotics Rash and Other (See Comments)    REVIEW OF SYSTEMS:   Review of Systems  Unable to perform ROS: Intubated    MEDICATIONS AT HOME:   Prior to Admission medications   Medication Sig Start Date End Date Taking? Authorizing Provider  acetaminophen (TYLENOL) 325 MG tablet Take 2 tablets (650 mg total) by mouth every 6 (six) hours as needed for mild pain (or Fever >/= 101). 05/21/18   Loletha Grayer, MD  albuterol (PROVENTIL) (2.5 MG/3ML) 0.083% nebulizer solution Take 3 mLs (2.5 mg total) by nebulization every 4 (four) hours as needed for wheezing. 02/10/19   Birdie Sons, MD  alendronate (FOSAMAX) 70 MG tablet Take 1 tablet (70 mg total) by mouth once a week. Take with a full glass of water on an empty stomach. 12/04/18   Birdie Sons, MD  ALPRAZolam Duanne Moron) 0.5 MG tablet Take 0.5-1 tablets (0.25-0.5 mg total) by mouth at bedtime as needed for anxiety. 02/02/19   Birdie Sons, MD  amoxicillin-clavulanate (AUGMENTIN) 875-125 MG tablet Take 1 tablet by mouth every 12 (twelve) hours for 5 days. 03/02/19 03/07/19  Otila Back, MD  atorvastatin (LIPITOR) 40 MG tablet Take 1 tablet (40 mg total) by mouth daily at 6 PM. 02/09/19   Fisher,  Kirstie Peri, MD  baclofen (LIORESAL) 10 MG tablet Take 1 tablet (10 mg total) by mouth 3 (three) times daily as needed for muscle spasms. 08/17/18   Triplett, Cari B, FNP  BELSOMRA 5 MG TABS Take 1 tablet by mouth at bedtime as needed (for insomnia). Patient taking differently: Take 5 mg by mouth at bedtime.  11/16/18   Birdie Sons, MD  benzonatate (TESSALON PERLES) 100 MG capsule Take 1 capsule (100 mg total) by mouth 3 (three) times daily as needed for cough. 02/03/19   Demetrios Loll, MD  buPROPion [facility] SR) 150 MG 12 hr tablet 1 tablet daily for 3 days, then 1 tablet twice daily. Stop smoking 14 days after starting medication Patient taking differently: Take 150 mg by  mouth 2 (two) times daily.  02/12/19 06/12/19  Birdie Sons, MD  celecoxib (CELEBREX) 100 MG capsule Take 100 mg by mouth 2 (two) times daily as needed for mild pain or moderate pain.  11/23/18   [provider]  clopidogrel (PLAVIX) 75 MG tablet Take 75 mg by mouth daily.    [provider]  diphenoxylate-atropine (LOMOTIL) 2.5-0.025 MG tablet Take 1 tablet by mouth as needed for diarrhea or loose stools. 06/10/18   [provider]  DULoxetine (CYMBALTA) 30 MG capsule Take 30 mg by mouth daily. 01/12/19   [provider]  estradiol (ESTRACE) 0.5 MG tablet Take 1 tablet (0.5 mg total) by mouth every other day. 09/09/18   Birdie Sons, MD  fentaNYL (DURAGESIC) 50 MCG/HR Place 1 patch onto the skin every 3 (three) days for 30 days. 05/01/19 05/31/19  Vevelyn Francois, NP  fentaNYL (DURAGESIC) 50 MCG/HR Place 1 patch onto the skin every 3 (three) days for 30 days. 04/01/19 05/01/19  Vevelyn Francois, NP  fentaNYL (DURAGESIC) 50 MCG/HR Place 1 patch onto the skin every 3 (three) days for 30 days. 03/02/19 04/01/19  Vevelyn Francois, NP  ferrous sulfate 325 (65 FE) MG tablet Take 1 tablet (325 mg total) by mouth 2 (two) times daily with a meal. 01/11/19   Birdie Sons, MD  Fluticasone-Salmeterol (ADVAIR  DISKUS) 250-50 MCG/DOSE AEPB TAKE ONE PUFF TWICE DAILY. Patient taking differently: Inhale 1 puff into the lungs 2 (two) times daily.  09/28/18   Birdie Sons, MD  memantine (NAMENDA) 5 MG tablet Take 5 mg by mouth 2 (two) times daily.     [provider]  mirtazapine (REMERON SOL-TAB) 15 MG disintegrating tablet Take 1 tablet (15 mg total) by mouth at bedtime. 01/11/19   Birdie Sons, MD  NARCAN 4 MG/0.1ML LIQD nasal spray kit Place 0.4 mg into the nose daily as needed (for accidental overdose.).  09/11/17   [provider]  ondansetron (ZOFRAN) 4 MG tablet Take 1 tablet (4 mg total) by mouth as needed. Patient taking differently: Take 4 mg by mouth as needed for nausea or vomiting.  02/12/19   Birdie Sons, MD  pantoprazole (PROTONIX) 40 MG tablet Take 40-80 mg by mouth See admin instructions. Take 1 tablet (40 mg) by mouth every morning and take 1 additional tablet ('40mg'$ ) every evening as needed for heartburn    [provider]  potassium chloride (K-DUR,KLOR-CON) 10 MEQ tablet Take 10 mEq by mouth daily. 12/31/18   [provider]  pregabalin (LYRICA) 300 MG capsule Take 300 mg by mouth 2 (two) times daily.    [provider]  silver sulfADIAZINE (SILVADENE) 1 % cream Apply 1 application topically daily as needed (leg infection).     [provider]  triamcinolone (KENALOG) 0.025 % ointment Apply 1 application topically 2 (two) times daily. Patient taking differently: Apply 1 application topically 2 (two) times daily as needed (cracking skin).  06/15/18   Birdie Sons, MD  vitamin B-12 (CYANOCOBALAMIN) 1000 MCG tablet Take 1 tablet (1,000 mcg total) by mouth daily. 05/21/18   Loletha Grayer, MD     VITAL SIGNS:  Blood pressure (!) 91/40, pulse (!) 123, temperature (!) 101 F (38.3 C), temperature source Axillary, resp. rate 20, height '4\' 10"'$  (1.473 m), weight 58.3 kg, SpO2 100 %.  PHYSICAL EXAMINATION:  Physical  Exam  GENERAL:  68 y.o.-year-old patient lying in the bed .  Looks critically ill  EYES: Pupils equal, round, reactive to light n. No scleral icterus. HEENT: Head atraumatic, normocephalic. Oropharynx and nasopharynx clear. ET tube in place NECK:  Supple, no jugular venous distention. No thyroid enlargement, no tenderness.  LUNGS: Normal breath sounds bilaterally, no wheezing, rales, rhonchi. No use of accessory muscles of respiration.  CARDIOVASCULAR: S1, S2 normal. No murmurs, rubs, or gallops.  ABDOMEN: Soft, nontender, nondistended. Bowel sounds present. No organomegaly or mass.  EXTREMITIES: No pedal edema, cyanosis, or clubbing. + 2 pedal & radial pulses b/l.   PSYCHIATRIC: The patient is sedated SKIN: No obvious rash, lesion, or ulcer.   LABORATORY PANEL:   CBC Recent Labs  Lab 03/16/2019 1339  WBC PENDING  HGB 11.6*  HCT 38.3  PLT 318   ------------------------------------------------------------------------------------------------------------------  Chemistries  Recent Labs  Lab 03/02/19 0242 02/28/2019 1339  NA 140 138  K 4.0 3.1*  CL 107 102  CO2 20* 25  GLUCOSE 66* 79  BUN 8 29*  CREATININE 0.69 2.98*  CALCIUM 8.1* 8.2*  MG 1.7  --   AST  --  30  ALT  --  11  ALKPHOS  --  51  BILITOT  --  0.7   ------------------------------------------------------------------------------------------------------------------  Cardiac Enzymes No results for input(s): TROPONINI in the last 168 hours. ------------------------------------------------------------------------------------------------------------------  RADIOLOGY:  No results found.   IMPRESSION AND PLAN:   *Right middle and lower lobe pneumonia with severe sepsis and acute on chronic hypoxic respiratory failure Patient will be admitted with broad-spectrum IV antibiotics for healthcare acquired pneumonia Full vent support.  Admit to ICU. IV fluid resuscitation for sepsis.  Pressor support if needed. Consult  intensivist.  Discussed with Dr. Jamal Collin Patient recently had COVID-19 test negative on 02/27/2019.  Has not left her house since being discharged on 03/02/2019. Culture sent and pending.  Lactic acid 4.7  *Acute kidney injury secondary to ATN from severe sepsis.  IV fluids.  Monitor input and output.  Repeat labs in the morning.  *Chronic systolic CHF with ejection fraction 45 to 50%.  Monitor for fluid overload.  *DVT prophylaxis with heparin   All the records are reviewed and case discussed with ED provider. Management plans discussed with the patient, family and they are in agreement.  CODE STATUS: DNR  TOTAL CC TIME TAKING CARE OF THIS PATIENT:  80 minutes.   Neita Carp M.D on 03/01/2019 at 2:44 PM  Between 7am to 6pm - Pager - [phone]  After 6pm go to [email or web address] - password [email or web address] [facility]  [facility] [place] [facility]  Office  [phone]  CC: Primary care physician; Birdie Sons, MD  Note: This dictation was prepared with Dragon dictation along with smaller phrase technology. Any transcriptional errors that result from this process are unintentional.

## 2019-03-06 NOTE — Procedures (Signed)
Central Venous Catheter Insertion Procedure Note Cheryl [name] [id number] Mar 23, 1951  Procedure: Insertion of Central Venous Catheter Indications: Drug and/or fluid administration  Procedure Details Consent: Risks of procedure as well as the alternatives and risks of each were explained to the (patient/caregiver).  Consent for procedure obtained. Time Out: Verified patient identification, verified procedure, site/side was marked, verified correct patient position, special equipment/implants available, medications/allergies/relevent history reviewed, required imaging and test results available.  Performed  Maximum sterile technique was used including antiseptics, cap, gloves, gown, hand hygiene, mask and sheet. Skin prep: Chlorhexidine; local anesthetic administered A antimicrobial bonded/coated triple lumen catheter was placed in the right femoral vein due to multiple attempts, no other available access using the Seldinger technique.  Evaluation Blood flow good Complications: No apparent complications Patient did tolerate procedure well. Chest X-ray ordered to verify placement.  CXR: not indicated .  Right femoral CVL placed utilizing ultrasound no complications noted during or following procedure.  Sonda Rumble, AGNP  Pulmonary/Critical Care Pager [phone] (please enter 7 digits) PCCM Consult Pager [phone] (please enter 7 digits)

## 2019-03-06 NOTE — ED Triage Notes (Signed)
Per EMS report, patient was found at home unresponsive and in respiratory distress. Patient was being bagged upon arrival by EMS. Patient was seen in ED recently for c/o shortness of breath and was Covid-19 negative.

## 2019-03-06 NOTE — Plan of Care (Signed)

## 2019-03-06 NOTE — Progress Notes (Signed)
Summary: patient admitted to unit from ED with respiratory failure and septic shock. Now on 10 mcg Levophed, 100% FiO2 via [facility]. Continues to decline. MD aware.

## 2019-03-06 NOTE — ED Notes (Signed)
Dr. Alphonzo Lemmings, Wille Celeste RT, Shanda Bumps RN, and this writer at bedside. Patient was given 100mg  Roc and 20mg  of Etomidate prior to intubation. ETT is 23 at the lip. Positive color change, lungs sound auscultated.

## 2019-03-06 NOTE — ED Notes (Signed)
Report received from lab of Troponin of 0.07

## 2019-03-07 ENCOUNTER — Inpatient Hospital Stay: Payer: Medicare HMO

## 2019-03-07 DIAGNOSIS — J9621 Acute and chronic respiratory failure with hypoxia: Secondary | ICD-10-CM

## 2019-03-07 DIAGNOSIS — E43 Unspecified severe protein-calorie malnutrition: Secondary | ICD-10-CM

## 2019-03-07 DIAGNOSIS — J690 Pneumonitis due to inhalation of food and vomit: Secondary | ICD-10-CM

## 2019-03-07 LAB — COMPREHENSIVE METABOLIC PANEL
ALT: 126 U/L — ABNORMAL HIGH (ref 0–44)
AST: 317 U/L — ABNORMAL HIGH (ref 15–41)
Albumin: 2.6 g/dL — ABNORMAL LOW (ref 3.5–5.0)
Alkaline Phosphatase: 44 U/L (ref 38–126)
Anion gap: 12 (ref 5–15)
BUN: 38 mg/dL — ABNORMAL HIGH (ref 8–23)
CO2: 18 mmol/L — ABNORMAL LOW (ref 22–32)
Calcium: 7.3 mg/dL — ABNORMAL LOW (ref 8.9–10.3)
Chloride: 111 mmol/L (ref 98–111)
Creatinine, Ser: 3.19 mg/dL — ABNORMAL HIGH (ref 0.44–1.00)
GFR calc Af Amer: 16 mL/min — ABNORMAL LOW (ref >60)
GFR calc non Af Amer: 14 mL/min — ABNORMAL LOW (ref >60)
Glucose, Bld: 106 mg/dL — ABNORMAL HIGH (ref 70–99)
Potassium: 4.3 mmol/L (ref 3.5–5.1)
Sodium: 141 mmol/L (ref 135–145)
Total Bilirubin: 0.6 mg/dL (ref 0.3–1.2)
Total Protein: 5.6 g/dL — ABNORMAL LOW (ref 6.5–8.1)

## 2019-03-07 LAB — GLUCOSE, CAPILLARY
Glucose-Capillary: 100 mg/dL — ABNORMAL HIGH (ref 70–99)
Glucose-Capillary: 124 mg/dL — ABNORMAL HIGH (ref 70–99)
Glucose-Capillary: 153 mg/dL — ABNORMAL HIGH (ref 70–99)
Glucose-Capillary: 53 mg/dL — ABNORMAL LOW (ref 70–99)
Glucose-Capillary: 74 mg/dL (ref 70–99)
Glucose-Capillary: 86 mg/dL (ref 70–99)
Glucose-Capillary: 95 mg/dL (ref 70–99)
Glucose-Capillary: 99 mg/dL (ref 70–99)

## 2019-03-07 LAB — URINE CULTURE: Culture: NO GROWTH

## 2019-03-07 LAB — CBC
HCT: 41.1 % (ref 36.0–46.0)
Hemoglobin: 11.9 g/dL — ABNORMAL LOW (ref 12.0–15.0)
MCH: 29.8 pg (ref 26.0–34.0)
MCHC: 29 g/dL — ABNORMAL LOW (ref 30.0–36.0)
MCV: 103 fL — ABNORMAL HIGH (ref 80.0–100.0)
Platelets: 340 10*3/uL (ref 150–400)
RBC: 3.99 MIL/uL (ref 3.87–5.11)
RDW: 14.6 % (ref 11.5–15.5)
WBC: 16.7 10*3/uL — ABNORMAL HIGH (ref 4.0–10.5)
nRBC: 0.1 % (ref 0.0–0.2)

## 2019-03-07 LAB — MAGNESIUM: Magnesium: 1.6 mg/dL — ABNORMAL LOW (ref 1.7–2.4)

## 2019-03-07 LAB — PHOSPHORUS: Phosphorus: 5.1 mg/dL — ABNORMAL HIGH (ref 2.5–4.6)

## 2019-03-07 LAB — PROCALCITONIN: Procalcitonin: >150 ng/mL

## 2019-03-07 MED ORDER — ADULT MULTIVITAMIN LIQUID CH
15.0000 mL | Freq: Every day | ORAL | Status: DC
  Administered 2019-03-07: 15 mL
  Filled 2019-03-07 (×2): qty 15

## 2019-03-07 MED ORDER — VASOPRESSIN 20 UNIT/ML IV SOLN
0.0300 [IU]/min | INTRAVENOUS | Status: DC
  Administered 2019-03-07 – 2019-03-08 (×2): 0.03 [IU]/min via INTRAVENOUS
  Filled 2019-03-07 (×2): qty 2

## 2019-03-07 MED ORDER — FREE WATER
100.0000 mL | Freq: Three times a day (TID) | Status: DC
  Administered 2019-03-07 (×3): 100 mL

## 2019-03-07 MED ORDER — DEXTROSE 50 % IV SOLN
INTRAVENOUS | Status: AC
  Filled 2019-03-07: qty 50

## 2019-03-07 MED ORDER — SODIUM CHLORIDE 0.9 % IV SOLN
500.0000 mg | Freq: Two times a day (BID) | INTRAVENOUS | Status: DC
  Administered 2019-03-07: 14:00:00 500 mg via INTRAVENOUS
  Filled 2019-03-07: qty 500
  Filled 2019-03-07 (×3): qty 0.5

## 2019-03-07 MED ORDER — PRO-STAT SUGAR FREE PO LIQD
30.0000 mL | Freq: Two times a day (BID) | ORAL | Status: DC
  Administered 2019-03-07: 09:00:00 30 mL

## 2019-03-07 MED ORDER — DEXTROSE 50 % IV SOLN
INTRAVENOUS | Status: AC
  Administered 2019-03-07: 50 mL via INTRAVENOUS
  Filled 2019-03-07: qty 50

## 2019-03-07 MED ORDER — VITAL AF 1.2 CAL PO LIQD
1000.0000 mL | ORAL | Status: DC
  Administered 2019-03-07: 1000 mL

## 2019-03-07 MED ORDER — SODIUM CHLORIDE 0.9 % IV SOLN
0.0000 ug/min | INTRAVENOUS | Status: DC
  Administered 2019-03-07: 09:00:00 330 ug/min via INTRAVENOUS
  Administered 2019-03-07: 380 ug/min via INTRAVENOUS
  Administered 2019-03-07: 400 ug/min via INTRAVENOUS
  Administered 2019-03-07: 13:00:00 380 ug/min via INTRAVENOUS
  Administered 2019-03-07: 200 ug/min via INTRAVENOUS
  Administered 2019-03-07: 400 ug/min via INTRAVENOUS
  Administered 2019-03-07: 07:00:00 300 ug/min via INTRAVENOUS
  Administered 2019-03-07: 20:00:00 400 ug/min via INTRAVENOUS
  Administered 2019-03-07: 390 ug/min via INTRAVENOUS
  Administered 2019-03-07: 12:00:00 360 ug/min via INTRAVENOUS
  Administered 2019-03-08 (×4): 400 ug/min via INTRAVENOUS
  Filled 2019-03-07: qty 4
  Filled 2019-03-07 (×5): qty 40
  Filled 2019-03-07: qty 4
  Filled 2019-03-07 (×2): qty 40
  Filled 2019-03-07 (×2): qty 4
  Filled 2019-03-07: qty 40
  Filled 2019-03-07 (×3): qty 4
  Filled 2019-03-07: qty 40

## 2019-03-07 MED ORDER — VITAL HIGH PROTEIN PO LIQD
1000.0000 mL | ORAL | Status: DC
  Administered 2019-03-07: 06:00:00 1000 mL

## 2019-03-07 MED ORDER — DEXTROSE 50 % IV SOLN
1.0000 | Freq: Once | INTRAVENOUS | Status: AC
  Administered 2019-03-07: 08:00:00 50 mL via INTRAVENOUS

## 2019-03-07 NOTE — Consult Note (Signed)
PCCM ROUNDING NOTE  INITIAL PRESENTATION: 68 y.o. F smoker with COPD, recurrent aspiration pneumonias and multiple other chronic medical problems, recently discharged (03/02/2019) from [facility] with discharge diagnosis of HCAP and sepsis.  Return to [facility] ED via EMS with fever, AMS, hypoxemia.  Intubated in ED. after intubation, reportedly ETS specimen was consistent with gastric contents.  Patient is now DNR in event of cardiac arrest per ER notes   MAJOR EVENTS/TEST RESULTS: 04/11 admission as above 04/11 CT head: No acute intracranial abnormality noted 04/12 increasing vasopressor requirements (norepinephrine, phenylephrine, vasopressin).  Remains unresponsive.  Oliguria with rising creatinine.  INDWELLING DEVICES:: ETT 04/11 >>  R femoral CVL 04/11 >>   MICRO DATA: MRSA PCR 4/11 >> negative Urine 4/11 >> NEG Resp 4/11 >> moderate GNRs >>  Blood 4/11 >>  PCT: 105,  > 150,   ANTIMICROBIALS:  Pip-tazo 04/11 >> 04/12 Meropenem 04/12 >>     SUBJ: RASS -4, -5 on minimal sedation.  Synchronous with ventilator   OBJ: Vitals:   03/07/19 1200 03/07/19 1300 03/07/19 1321 03/07/19 1400  BP: 102/74 (!) 84/44  (!) 124/55  Pulse: 63  (!) 120   Resp: '19 18 20 '$ (!) 21  Temp: 98.3 F (36.8 C)     TempSrc: Axillary     SpO2:      Weight:      Height:        Vent Mode: AC FiO2 (%):  [80 %-100 %] 80 % Set Rate:  [16 bmp] 16 bmp Vt Set:  [400 mL] 400 mL PEEP:  [8 cmH20-10 cmH20] 8 cmH20   Intake/Output Summary (Last 24 hours) at 03/07/2019 1427 Last data filed at 03/07/2019 1426 Gross per 24 hour  Intake 6471.49 ml  Output 545 ml  Net 5926.49 ml     Gen: RASS -4, minimally responsive, intubated, synchronous with ventilator HEENT: NCAT, sclerae white, edentulous Neck: No JVD noted Lungs: Scattered rhonchi, no wheezes Cardiovascular: Tachycardia with atrial extrasystoles, no M Abdomen: Soft, nontender, normal BS Ext: Cyanotic with livedo reticularis, diminished to absent  pulses, cool Neuro: Pupils react, no spontaneous movement, no withdrawal Skin: Limited exam, no lesions noted   BMP Latest Ref Rng & Units 03/07/2019 03/23/2019 03/02/2019  Glucose 70 - 99 mg/dL 106(H) 79 66(L)  BUN 8 - 23 mg/dL 38(H) 29(H) 8  Creatinine 0.44 - 1.00 mg/dL 3.19(H) 2.98(H) 0.69  BUN/Creat Ratio 12 - 28 - - -  Sodium 135 - 145 mmol/L 141 138 140  Potassium 3.5 - 5.1 mmol/L 4.3 3.1(L) 4.0  Chloride 98 - 111 mmol/L 111 102 107  CO2 22 - 32 mmol/L 18(L) 25 20(L)  Calcium 8.9 - 10.3 mg/dL 7.3(L) 8.2(L) 8.1(L)    CBC Latest Ref Rng & Units 03/07/2019 02/26/2019 03/01/2019  WBC 4.0 - 10.5 K/uL 16.7(H) 2.3(L) 5.9  Hemoglobin 12.0 - 15.0 g/dL 11.9(L) 11.6(L) 9.6(L)  Hematocrit 36.0 - 46.0 % 41.1 38.3 31.7(L)  Platelets 150 - 400 K/uL 340 318 215    Hepatic Function Latest Ref Rng & Units 03/07/2019 03/05/2019 02/27/2019  Total Protein 6.5 - 8.1 g/dL 5.6(L) 6.2(L) 6.6  Albumin 3.5 - 5.0 g/dL 2.6(L) 3.0(L) 3.6  AST 15 - 41 U/L 317(H) 30 15  ALT 0 - 44 U/L 126(H) 11 9  Alk Phosphatase 38 - 126 U/L 44 51 66  Total Bilirubin 0.3 - 1.2 mg/dL 0.6 0.7 0.9  Bilirubin, Direct 0.1 - 0.5 mg/dL - - -    Cardiac Panel (last 3 results) Recent Labs  03/22/2019 1339  TROPONINI 0.07*    CXR: Extensive right lower lobe infiltrate consistent with pneumonia    IMPRESSION/PLAN: PULMONARY: Acute hypoxemic respiratory failure Underlying severe COPD History of recurrent aspiration pneumonia  RLL PNA  Cont full vent support - settings reviewed and/or adjusted Cont vent bundle Daily SBT if/when meets criteria Continue nebulized steroids and bronchodilators  CARDIOVASCULAR: Septic shock Persistent lactic acidosis History of CAD Dilated cardiomyopathy (LVEF 45-50% by echo 11/03/2018)  MAP goal > 65 mmHg Continue current vasopressors to achieve MAP goal Continue empiric stress dose steroids with hydrocortisone   RENAL: AKI, oliguric Hypokalemia, resolved  Monitor BMET  intermittently Monitor I/Os Correct electrolytes as indicated Not a candidate for hemodialysis  GI/NUTRITION: Severe protein-calorie malnutrition  SUP: IV pantoprazole TF protocol initiated 4/12  INFECTIOUS DISEASE: Severe sepsis RLL PNA with GNR Markedly elevated and rising procalcitonin  Monitor temp, WBC count Micro and abx as above   ENDOCRINE: No acute issues No prior history of diabetes  Monitor CBGs Consider SSI for CBGs >180  HEME: Mild anemia without acute blood loss Leukopenia likely due to severe sepsis  DVT px: SQ heparin Monitor CBC intermittently Transfuse per usual guidelines  NEURO: Acute encephalopathy due to severe sepsis Chronic opioid use  PAD protocol: intermittent fentanyl RASS goal -1, -2  CCM time: 45 mins The above time includes time spent in consultation with patient and/or family members and reviewing care plan on multidisciplinary rounds.  I updated patient's husband at bedside.  He understands prognosis for survival is poor.  He wishes to continue full support for the next couple of days.  He understands that she is not a candidate for dialysis and that we will not perform ACLS in the event of cardiac arrest.  Merton Border, MD PCCM service Mobile [phone] Pager [phone] 03/07/2019 2:27 PM

## 2019-03-07 NOTE — Progress Notes (Signed)
Patient continues to decline.  Family called and husband is currently at the bedside.

## 2019-03-07 NOTE — Progress Notes (Signed)
Initial Nutrition Assessment  RD working remotely.  DOCUMENTATION CODES:   Not applicable  INTERVENTION:  Initiate Vital AF 1.2 at 45 mL/hr (1080 mL goal daily volume) per tube. Provides 1296 kcal, 81 grams of protein, 875 mL H2O daily.  Will discontinue Pro-Stat.  Provide liquid MVI daily per tube to meet 100% RDIs for vitamins/minerals.  With free water flush 100 mL Q8hrs patient will receive 1175 mL H2O daily including water in tube feeding.  Monitor magnesium, potassium, and phosphorus daily for at least 3 days, MD to replete as needed, as pt is at risk for refeeding syndrome.  NUTRITION DIAGNOSIS:   Inadequate oral intake related to inability to eat as evidenced by NPO status.  GOAL:   Provide needs based on ASPEN/SCCM guidelines  MONITOR:   Vent status, Labs, Weight trends, TF tolerance, I & O's  REASON FOR ASSESSMENT:   Ventilator, Consult Enteral/tube feeding initiation and management  ASSESSMENT:   68 year old female with PMHx of HTN, HLD, GERD, neuropathy, dilated cardiomyopathy (LVEF 45-50% 11/03/2018), chronic pain, hx DVT, asthma, hx NSTEMI, COPD, recurrent aspiration PNA admitted with acute hypoxemic respiratory failure requiring intubation on 4/11, new RLL PNA consistent with aspiration, septic shock, AKI, also being ruled out for COVID-19.    Patient intubated and sedated. On Assist-Control with FiO2 90% and PEEP 10 cmH2O. Abdomen soft per RN documentation. Patient had a large type 7 BM last night. Patient was started on tube feed protocol this AM with VHP at 40 mL/hr + Pro-Stat 30 ml BID +  free water flush of 100 mL Q8hrs. Patient known to this RD from a previous assessment in 05/2018 and by other RDs from other admissions. She has previously met criteria for severe chronic malnutrition and RD suspects patient is still severely malnourished, just unable to determine in absence of NFPE.  Enteral Access: 18 Fr. OGT placed 4/11; terminates in gastric fundus per  chest x-ray 4/12; 65 cm at corner of mouth  MAP: 46-116 mmHg  Patient is currently intubated on ventilator support Ve: 6.6 L/min Temp (24hrs), Avg:99.5 F (37.5 C), Min:98.5 F (36.9 C), Max:101 F (38.3 C)  Propofol: N/A  Medications reviewed and include: Solu-Cortef 50 mg Q12hrs IV, pantoprazole, norepinephrine gtt at 25 mcg/min, phenylephrine gtt at 300 mcg/min, Zosyn, vasopressin.  Labs reviewed: CBG 53-124, CO2 18, BUN 38, Creatinine 3.19, Phosphorus 5.1.  I/O: 460 mL UOP Yesterday  NUTRITION - FOCUSED PHYSICAL EXAM:  Unable to complete at this time.  Diet Order:   Diet Order    None     EDUCATION NEEDS:   No education needs have been identified at this time  Skin:  Skin Assessment: Reviewed RN Assessment  Last BM:  03/25/2019 - large type 7  Height:   Ht Readings from Last 1 Encounters:  02/25/2019 '4\' 10"'$  (1.473 m)   Weight:   Wt Readings from Last 1 Encounters:  03/20/2019 56.5 kg   Ideal Body Weight:     BMI:  Body mass index is 26.03 kg/m.  Estimated Nutritional Needs:   Kcal:  1330 (PSU 2003b w/ MSJ 980, Ve 6.6, Tmax 38.3)  Protein:  70-85 grams (1.2-1.5 grams/kg)  Fluid:  1.4 L/day (25 mL/kg)  Willey Blade, MS, RD, LDN Office: [phone] Pager: [phone] After Hours/Weekend Pager: [phone]

## 2019-03-07 NOTE — Progress Notes (Signed)
Pharmacy Antibiotic Note  Cheryl [name] is a 68 y.o. female admitted on 03/13/2019 with pneumonia/sepsis for which she was recently treated here at [facility].  Pharmacy has been consulted for Zosyn dosing. She continues to be febrile, WBC has increased from 2.3 to 16.7 k/mcL and PCT continues to increase with last LA 4.0 mmol/L, SCr continues to worsen above baseline of approximately 0.7 mg/dL  Plan: Start meropenem 500 mg IV every 12 hours  Height: 4\' 10"  (147.3 cm) Weight: 124 lb 9 oz (56.5 kg) IBW/kg (Calculated) : 40.9  Temp (24hrs), Avg:99.2 F (37.3 C), Min:98 F (36.7 C), Max:101 F (38.3 C)  Recent Labs  Lab 03/01/19 0503 03/02/19 0242 03/05/2019 1339 03/05/2019 1620 03/18/2019 2225 03/07/19 0512  WBC 5.9  --  2.3*  --   --  16.7*  CREATININE 0.67 0.69 2.98*  --   --  3.19*  LATICACIDVEN  --   --  4.7* 5.6* 4.0*  --     Estimated Creatinine Clearance: 12.2 mL/min (A) (by C-G formula based on SCr of 3.19 mg/dL (H)).    Antimicrobials this admission: 4/12 meropenem >> 4/11 vancomycin x1 4/11 zosyn >> 4/12  Microbiology results: 4/1 BCx: pending 4/11 UCx: pending  4/12 RCx moderate GNR 4/6 MRSA PCR: negative  Thank you for allowing pharmacy to be a part of this patient's care.  Lowella Bandy, PharmD 03/07/2019 10:32 AM

## 2019-03-07 NOTE — Progress Notes (Signed)
   03/07/19 1500  Clinical Encounter Type  Visited With Patient and family together  Visit Type Follow-up  Spiritual Encounters  Spiritual Needs Prayer;Ritual;Grief support  Ch gathered the family members outside in the parking lot and provided a prayer to help them with their grieving process. Ch then assisted the rest of the children go visit her mother taking turns. (husband, 2 sons and 3 daughters).

## 2019-03-07 NOTE — Progress Notes (Signed)
[facility] [place] at [facility]   PATIENT NAME: Cheryl [name]    MR#:  [id number]  DATE OF BIRTH:  1951/06/24  SUBJECTIVE:  CHIEF COMPLAINT: Intubated.  On 3 pressors Clinical situation is deteriorating REVIEW OF SYSTEMS:  Review of system unobtainable  DRUG ALLERGIES:   Allergies  Allergen Reactions  . Percocet [Oxycodone-Acetaminophen] Hives and Rash  . Aspirin Nausea And Vomiting and Other (See Comments)  . Codeine Nausea And Vomiting, Nausea Only and Other (See Comments)  . Propoxyphene Nausea Only and Other (See Comments)  . Sulfa Antibiotics Rash and Other (See Comments)    VITALS:  Blood pressure (!) 124/55, pulse (!) 120, temperature 98.3 F (36.8 C), temperature source Axillary, resp. rate (!) 21, height 4\' 10"  (1.473 m), weight 56.5 kg, SpO2 96 %.  PHYSICAL EXAMINATION:  GENERAL:  68 y.o.-year-old patient lying in the bed with no acute distress.  EYES: Pupils equal, round, reactive to light and accommodation. No scleral icterus.  HEENT: Head atraumatic, normocephalic. Oropharynx with ET tube NECK:  Supple, no jugular venous distention. No thyroid enlargement, no tenderness.  LUNGS: Diminished breath sounds bilaterally, no wheezing, rales,rhonchi or crepitation. No use of accessory muscles of respiration.  CARDIOVASCULAR: S1, S2 normal. No murmurs, rubs, or gallops.  ABDOMEN: Soft, nontender, nondistended. Bowel sounds present.   EXTREMITIES: No pedal edema, cyanosis, or clubbing.  NEUROLOGIC: Encephalopathic Gait not checked.  PSYCHIATRIC: The patient is off sedation SKIN: Mottled skin of lower extremities   LABORATORY PANEL:   CBC Recent Labs  Lab 03/07/19 0512  WBC 16.7*  HGB 11.9*  HCT 41.1  PLT 340   ------------------------------------------------------------------------------------------------------------------  Chemistries  Recent Labs  Lab 03/07/19 0512  NA 141  K 4.3  CL 111  CO2 18*  GLUCOSE 106*  BUN 38*   CREATININE 3.19*  CALCIUM 7.3*  MG 1.6*  AST 317*  ALT 126*  ALKPHOS 44  BILITOT 0.6   ------------------------------------------------------------------------------------------------------------------  Cardiac Enzymes Recent Labs  Lab 03/04/2019 1339  TROPONINI 0.07*   ------------------------------------------------------------------------------------------------------------------  RADIOLOGY:  Ct Head Wo Contrast  Result Date: 03/20/2019 CLINICAL DATA:  Unresponsive. Unresponsive pupils. Intubated. EXAM: CT HEAD WITHOUT CONTRAST TECHNIQUE: Contiguous axial images were obtained from the base of the skull through the vertex without intravenous contrast. COMPARISON:  01/15/2019 FINDINGS: Brain: There is no evidence of acute infarct, intracranial hemorrhage, mass, midline shift, or extra-axial fluid collection. The ventricles and sulci are normal. Patchy cerebral white matter hypodensities are unchanged and nonspecific but compatible with moderate chronic small vessel ischemic disease. Vascular: Calcified atherosclerosis at the skull base. No hyperdense vessel. Skull: No fracture or focal osseous lesion. Sinuses/Orbits: Moderate mucosal thickening in the ethmoid air cells bilaterally. Partially visualized fluid in the maxillary sinuses. Clear mastoid air cells. Bilateral cataract extraction. Other: None. IMPRESSION: 1. No evidence of acute intracranial abnormality. 2. Moderate chronic small vessel ischemic disease. Electronically Signed   By: Sebastian Ache M.D.   On: 03/02/2019 21:46   Dg Chest Port 1 View  Result Date: 03/07/2019 CLINICAL DATA:  Respiratory failure EXAM: PORTABLE CHEST 1 VIEW COMPARISON:  Chest radiograph from one day prior. FINDINGS: Endotracheal tube tip is 1.5 cm above the carina. Enteric tube terminates in the gastric fundus. Stable cardiomediastinal silhouette with mild cardiomegaly. No pneumothorax. No convincing pleural effusion. Patchy consolidation throughout the  right lung and at the left lung base, minimally improved on the right. IMPRESSION: 1. Endotracheal tube tip is 1.5 cm above the carina, consider retracting 1 cm. 2.  Patchy consolidation throughout the right lung and at the left lung base, minimally improved on the right, differential includes multilobar pneumonia and/or asymmetric pulmonary edema. 3. Mild cardiomegaly. These results will be called to the ordering clinician or representative by the Radiologist Assistant, and communication documented in the PACS or zVision Dashboard. Electronically Signed   By: Delbert PhenixJason A Poff M.D.   On: 03/07/2019 07:40   Dg Chest Port 1 View  Result Date: May 29, 2019 CLINICAL DATA:  Acute encephalopathy. Found unresponsive and in respiratory distress. History of pneumonia. EXAM: PORTABLE CHEST 1 VIEW COMPARISON:  Radiographs 02/27/2019 and 02/01/2019. FINDINGS: 1323 hours. Patient is intubated. Tip of the endotracheal tube is 2.3 cm above the carina. A nasogastric tube is looped in the proximal stomach. The heart size and mediastinal contours are stable. There is new right perihilar and right basilar consolidation consistent with pneumonia. There is stable patchy airspace disease at the left lung base. No pneumothorax or significant pleural effusion. No acute osseous findings. IMPRESSION: New right perihilar and basilar airspace opacities consistent with worsening multilobar pneumonia. Left basilar component has not significantly changed. The endotracheal and enteric tubes appear satisfactorily positioned. Electronically Signed   By: Carey BullocksWilliam  Veazey M.D.   On: 0Jul 04, 2020 14:54    EKG:   Orders placed or performed during the hospital encounter of 06-23-19  . ED EKG 12-Lead  . ED EKG 12-Lead  . EKG 12-Lead  . EKG 12-Lead    ASSESSMENT AND PLAN:  Right middle and lower lobe pneumonia with severe sepsis and acute on chronic hypoxic respiratory failure Intubated, vent management per intensivist  broad-spectrum IV  antibiotics for healthcare acquired pneumonia On 3 pressors IV fluid resuscitation for sepsis.  Discussed with Dr. Darrol AngelSimons Patient recently had COVID-19 test negative on 02/27/2019.  Has not left her house since being discharged on 03/02/2019. Culture sent and pending.   Lactic acid 4.7-4.0.  Procalcitonin greater than 150  *Acute kidney injury secondary to ATN from severe sepsis.   IV fluids.  Monitor input and output.  Repeat labs in the morning.  *Chronic systolic CHF with ejection fraction 45 to 50%.  Monitor for fluid overload.  *Failure to thrive  very high risk for cardiopulmonary arrest with very poor prognosis  *DVT prophylaxis with heparin     All the records are reviewed and case discussed with Care Management/Social Workerr. Management plans discussed with the family by intensivist  CODE STATUS: dnr  TOTAL TIME TAKING CARE OF THIS PATIENT: 35minutes.     Note: This dictation was prepared with Dragon dictation along with smaller phrase technology. Any transcriptional errors that result from this process are unintentional.   Ramonita LabAruna Chayson Charters M.D on 03/07/2019 at 2:32 PM  Between 7am to 6pm - Pager - [phone] After 6pm go to [email or web address] - password [email or web address] [facility]  [facility]  Office  [phone]  CC: Primary care physician; Malva LimesFisher, Donald E, MD

## 2019-03-07 NOTE — Progress Notes (Signed)
   03/07/19 1400  Clinical Encounter Type  Visited With Patient and family together  Visit Type Initial;Critical Care  Referral From Nurse  Spiritual Encounters  Spiritual Needs Emotional  Ch provided a caring presence as family came to visit the pt who is declining quickly. Son wanted to be alone with the pt so ch gave him the space. Ch will follow up as needed.

## 2019-03-08 ENCOUNTER — Ambulatory Visit: Payer: Self-pay

## 2019-03-08 ENCOUNTER — Encounter: Payer: Self-pay | Admitting: Primary Care

## 2019-03-08 ENCOUNTER — Encounter: Payer: Self-pay | Admitting: Oncology

## 2019-03-08 ENCOUNTER — Inpatient Hospital Stay: Payer: Medicare HMO

## 2019-03-08 ENCOUNTER — Inpatient Hospital Stay: Payer: Self-pay | Admitting: Family Medicine

## 2019-03-08 DIAGNOSIS — I10 Essential (primary) hypertension: Secondary | ICD-10-CM

## 2019-03-08 DIAGNOSIS — J441 Chronic obstructive pulmonary disease with (acute) exacerbation: Secondary | ICD-10-CM

## 2019-03-08 DIAGNOSIS — Z7189 Other specified counseling: Secondary | ICD-10-CM

## 2019-03-08 DIAGNOSIS — Z9911 Dependence on respirator [ventilator] status: Secondary | ICD-10-CM

## 2019-03-08 LAB — CBC
HCT: 33.2 % — ABNORMAL LOW (ref 36.0–46.0)
Hemoglobin: 9.8 g/dL — ABNORMAL LOW (ref 12.0–15.0)
MCH: 30.4 pg (ref 26.0–34.0)
MCHC: 29.5 g/dL — ABNORMAL LOW (ref 30.0–36.0)
MCV: 103.1 fL — ABNORMAL HIGH (ref 80.0–100.0)
Platelets: 154 10*3/uL (ref 150–400)
RBC: 3.22 MIL/uL — ABNORMAL LOW (ref 3.87–5.11)
RDW: 15 % (ref 11.5–15.5)
WBC: 34.7 10*3/uL — ABNORMAL HIGH (ref 4.0–10.5)
nRBC: 0.6 % — ABNORMAL HIGH (ref 0.0–0.2)

## 2019-03-08 LAB — COMPREHENSIVE METABOLIC PANEL
ALT: 336 U/L — ABNORMAL HIGH (ref 0–44)
AST: 595 U/L — ABNORMAL HIGH (ref 15–41)
Albumin: 2.2 g/dL — ABNORMAL LOW (ref 3.5–5.0)
Alkaline Phosphatase: 56 U/L (ref 38–126)
Anion gap: 11 (ref 5–15)
BUN: 42 mg/dL — ABNORMAL HIGH (ref 8–23)
CO2: 16 mmol/L — ABNORMAL LOW (ref 22–32)
Calcium: 6.1 mg/dL — CL (ref 8.9–10.3)
Chloride: 115 mmol/L — ABNORMAL HIGH (ref 98–111)
Creatinine, Ser: 4.02 mg/dL — ABNORMAL HIGH (ref 0.44–1.00)
GFR calc Af Amer: 12 mL/min — ABNORMAL LOW (ref >60)
GFR calc non Af Amer: 11 mL/min — ABNORMAL LOW (ref >60)
Glucose, Bld: 130 mg/dL — ABNORMAL HIGH (ref 70–99)
Potassium: 3.9 mmol/L (ref 3.5–5.1)
Sodium: 142 mmol/L (ref 135–145)
Total Bilirubin: 0.6 mg/dL (ref 0.3–1.2)
Total Protein: 4.9 g/dL — ABNORMAL LOW (ref 6.5–8.1)

## 2019-03-08 LAB — GLUCOSE, CAPILLARY: Glucose-Capillary: 121 mg/dL — ABNORMAL HIGH (ref 70–99)

## 2019-03-08 LAB — PHOSPHORUS: Phosphorus: 5.7 mg/dL — ABNORMAL HIGH (ref 2.5–4.6)

## 2019-03-08 LAB — PROCALCITONIN: Procalcitonin: >150 ng/mL

## 2019-03-08 LAB — LACTIC ACID, PLASMA: Lactic Acid, Venous: 3.2 mmol/L (ref 0.5–1.9)

## 2019-03-08 LAB — PATHOLOGIST SMEAR REVIEW

## 2019-03-08 LAB — MAGNESIUM: Magnesium: 1.5 mg/dL — ABNORMAL LOW (ref 1.7–2.4)

## 2019-03-08 MED ORDER — MAGNESIUM SULFATE 2 GM/50ML IV SOLN
2.0000 g | Freq: Once | INTRAVENOUS | Status: AC
  Administered 2019-03-08: 05:00:00 2 g via INTRAVENOUS
  Filled 2019-03-08: qty 50

## 2019-03-08 MED ORDER — DEXTROSE 5 % IV SOLN: INTRAVENOUS | Status: DC

## 2019-03-08 MED ORDER — POLYVINYL ALCOHOL 1.4 % OP SOLN
1.0000 [drp] | Freq: Four times a day (QID) | OPHTHALMIC | Status: DC | PRN
  Filled 2019-03-08: qty 15

## 2019-03-08 MED ORDER — GLYCOPYRROLATE 1 MG PO TABS: 1.0000 mg | ORAL_TABLET | ORAL | Status: DC | PRN

## 2019-03-08 MED ORDER — MORPHINE 100MG IN NS 100ML (1MG/ML) PREMIX INFUSION
5.0000 mg/h | INTRAVENOUS | Status: DC
  Administered 2019-03-08: 10:00:00 5 mg/h via INTRAVENOUS
  Filled 2019-03-08: qty 100

## 2019-03-08 MED ORDER — SODIUM CHLORIDE 0.9 % IV SOLN
5.0000 mg/h | INTRAVENOUS | Status: DC
  Filled 2019-03-08: qty 10

## 2019-03-08 MED ORDER — SODIUM CHLORIDE 0.9 % IV SOLN
500.0000 mg | INTRAVENOUS | Status: DC
  Filled 2019-03-08: qty 0.5

## 2019-03-08 MED ORDER — CALCIUM GLUCONATE-NACL 2-0.675 GM/100ML-% IV SOLN
2.0000 g | Freq: Once | INTRAVENOUS | Status: AC
  Administered 2019-03-08: 04:00:00 2000 mg via INTRAVENOUS
  Filled 2019-03-08: qty 100

## 2019-03-08 MED ORDER — GLYCOPYRROLATE 0.2 MG/ML IJ SOLN: 0.2000 mg | INTRAMUSCULAR | Status: DC | PRN

## 2019-03-08 MED ORDER — MORPHINE SULFATE (PF) 4 MG/ML IV SOLN: 4.0000 mg | INTRAVENOUS | Status: DC | PRN

## 2019-03-09 ENCOUNTER — Ambulatory Visit: Payer: Medicare HMO | Admitting: Oncology

## 2019-03-09 ENCOUNTER — Telehealth: Payer: Self-pay | Admitting: Pulmonary Disease

## 2019-03-09 ENCOUNTER — Other Ambulatory Visit: Payer: Medicare HMO

## 2019-03-09 LAB — CULTURE, RESPIRATORY

## 2019-03-09 LAB — GLUCOSE, CAPILLARY: Glucose-Capillary: 14 mg/dL — CL (ref 70–99)

## 2019-03-09 LAB — CULTURE, RESPIRATORY W GRAM STAIN

## 2019-03-09 NOTE — Telephone Encounter (Signed)
Death certificate received. Placed in DS box for completion.  

## 2019-03-10 NOTE — Telephone Encounter (Signed)
Death Certificate completed. Luan Pulling and [place] aware ready for pickup.

## 2019-03-11 LAB — CULTURE, BLOOD (ROUTINE X 2)
Culture: NO GROWTH
Culture: NO GROWTH
Special Requests: ADEQUATE
Special Requests: ADEQUATE

## 2019-03-15 DIAGNOSIS — J449 Chronic obstructive pulmonary disease, unspecified: Secondary | ICD-10-CM | POA: Diagnosis not present

## 2019-03-17 DIAGNOSIS — J449 Chronic obstructive pulmonary disease, unspecified: Secondary | ICD-10-CM | POA: Diagnosis not present

## 2019-03-19 ENCOUNTER — Ambulatory Visit: Payer: Self-pay | Admitting: Family Medicine

## 2019-03-25 DIAGNOSIS — J449 Chronic obstructive pulmonary disease, unspecified: Secondary | ICD-10-CM | POA: Diagnosis not present

## 2019-03-25 DIAGNOSIS — J189 Pneumonia, unspecified organism: Secondary | ICD-10-CM | POA: Diagnosis not present

## 2019-03-25 DIAGNOSIS — R4182 Altered mental status, unspecified: Secondary | ICD-10-CM | POA: Diagnosis not present

## 2019-03-25 DIAGNOSIS — M6281 Muscle weakness (generalized): Secondary | ICD-10-CM | POA: Diagnosis not present

## 2019-03-26 NOTE — Progress Notes (Signed)
Patient's husband at bedside.  Comfort care  Measures only initiated at this time per family and Dr Sung Amabile.  Patient's belongings returned to patient's husband from Security.  Morphine gtt started on patient.  Waiting on respiratory to come to bedside, and we will terminally extubate patient with CC measures only

## 2019-03-26 NOTE — Progress Notes (Signed)
Nutrition Brief Follow-Up Note  Chart reviewed. Patient now transitioning to comfort care.   No further nutrition interventions warranted at this time.  Please re-consult RD as needed.   Tyah Acord, MS, RD, LDN Office: [phone] Pager: [phone] After Hours/Weekend Pager: [phone]    

## 2019-03-26 NOTE — Telephone Encounter (Signed)
She's back in the hospital.

## 2019-03-26 NOTE — Discharge Summary (Signed)
DEATH SUMMARY  DATE OF ADMISSION:    DATE OF DISCHARGE/DEATH:    ADMISSION DIAGNOSES:   Acute on chronic hypoxemic respiratory failure RML, RLL pneumonia Acute kidney injury Chronic systolic heart failure  DISCHARGE DIAGNOSES:   Acute/chronic hypoxemic respiratory failure Severe COPD History of recurrent aspiration pneumonia  RLL PNA with GNR (Klebsiella and E. coli) Severe sepsis with refractory septic shock Persistent lactic acidosis History of CAD History of dilated cardiomyopathy (LVEF 45-50% by echo 11/03/2018) AKI, oliguric Hypokalemia, resolved Severe protein-calorie malnutrition Markedly elevated procalcitonin Mild macrocytic anemia without acute blood loss Leukopenia, resolved: Acute septic encephalopathy Chronic opioid use prior to admission   PRESENTATION:   Pt was admitted with the following HPI and the above admission diagnoses:  Cheryl [name]  is a 68 y.o. female with a known history of COPD, CAD, chronic systolic CHF, hypertension, chronic respiratory failure, recenty discharged after being treated for healthcare acquired pneumonia on 03/02/2019 returns due to worsening shortness of breath and hypoxia.  Patient had to be intubated.  Has been afebrile, hypotensive.  Initially COVID-19 was considered in differential but this seems unlikely as patient has not left home since discharge and COVID-19 testing on 02/27/2019 has been negative.  She has received 1 L normal saline along with broad-spectrum antibiotics. Will be admitted to ICU. ER physician has discussed with husband who has requested aggressive care including full vent support and pressor support if needed.  But patient is DNR- no CPR.  HOSPITAL COURSE:   04/11 admission as above 04/11 CT head: No acute intracranial abnormality noted Mar 20, 2023 increasing vasopressor requirements (norepinephrine, phenylephrine, vasopressin).  Remains unresponsive.  Oliguria with rising creatinine. Mar 21, 2023 remains comatose.  Remains on  maximum dose vasopressors.  Worsening urine output and rising creatinine.  Worsening circulation to both feet which are now violaceous and cold.  Goals of care conversation with patient's husband and children.  Decision made to proceed with terminal extubation and comfort measures only  INDWELLING DEVICES:: ETT 04/11 >> 03-21-2023 (terminal extubation) R femoral CVL 04/11 >>   MICRO DATA: MRSA PCR 4/11 >> negative Urine 4/11 >> NEG Resp 4/11 >>  Klebsiella and E. coli Blood 4/11 >>  PCT: 105,  > 150,  > 150  ANTIMICROBIALS:  Pip-tazo 04/11 >> 03-20-2023 Meropenem 2023/03/20 >> 03-21-23  Cause of death:  Severe sepsis with septic shock due to healthcare associated pneumonia (Klebsiella and E. coli)  Contributing factors: Acute/chronic hypoxemic respiratory failure AKI COPD Severe protein-calorie malnutrition Dilated cardiomyopathy  Autopsy: No   Billy Fischer, MD PCCM service Mobile [phone] Pager [phone] 03/10/2019 3:44 PM

## 2019-03-26 NOTE — Progress Notes (Deleted)
       Patient: Cheryl [name] Female    DOB: [birth year]   68 y.o.   MRN: [id number] Visit Date: 2019-03-22  Today's Provider: Mila Merry, MD   No chief complaint on file.  Subjective:     HPI    Follow up Hospitalization  Patient was admitted to *** on *** and discharged on ***. She was treated for ***. Treatment for this included ***. Telephone follow up was done on *** She reports {DESC; EXCELLENT/GOOD/FAIR:19992} compliance with treatment. She reports this condition is {improved/worse/unchanged:3041574}.  ------------------------------------------------------------------------------------     Allergies  Allergen Reactions  . Percocet [Oxycodone-Acetaminophen] Hives and Rash  . Aspirin Nausea And Vomiting and Other (See Comments)  . Codeine Nausea And Vomiting, Nausea Only and Other (See Comments)  . Propoxyphene Nausea Only and Other (See Comments)  . Sulfa Antibiotics Rash and Other (See Comments)    No current facility-administered medications for this visit.  No current outpatient medications on file.  Facility-Administered Medications Ordered in Other Visits:  .  [DISCONTINUED] acetaminophen (TYLENOL) tablet 650 mg, 650 mg, Per Tube, Q6H PRN **OR** acetaminophen (TYLENOL) suppository 650 mg, 650 mg, Rectal, Q6H PRN, Merwyn Katos, MD .  dextrose 5 % solution, , Intravenous, Continuous, Simonds, David B, MD .  glycopyrrolate (ROBINUL) tablet 1 mg, 1 mg, Oral, Q4H PRN **OR** glycopyrrolate (ROBINUL) injection 0.2 mg, 0.2 mg, Subcutaneous, Q4H PRN **OR** glycopyrrolate (ROBINUL) injection 0.2 mg, 0.2 mg, Intravenous, Q4H PRN, Merwyn Katos, MD .  morphine 100mg  in NS (1mg /mL) infusion - premix, 5 mg/hr, Intravenous, Continuous, Merwyn Katos, MD, Stopped at Mar 22, 2019 1131 .  morphine 4 MG/ML injection 4 mg, 4 mg, Intravenous, Q15 min PRN, Merwyn Katos, MD .  norepinephrine (LEVOPHED) 16 mg in premix infusion, 0-40 mcg/min, Intravenous,  Titrated, Blakeney, Neldon Newport, NP, Last Rate: 13.1 mL/hr at 03-22-2019 0400, 13.973 mcg/min at 22-Mar-2019 0400 .  [DISCONTINUED] ondansetron (ZOFRAN) tablet 4 mg, 4 mg, Oral, Q6H PRN **OR** ondansetron (ZOFRAN) injection 4 mg, 4 mg, Intravenous, Q6H PRN, Sudini, Srikar, MD .  phenylephrine (NEO-SYNEPHRINE) 40 mg in sodium chloride 0.9 % 250 mL (0.16 mg/mL) infusion, 0-400 mcg/min, Intravenous, Titrated, Blakeney, Neldon Newport, NP, Last Rate: 150 mL/hr at March 22, 2019 0526, 400 mcg/min at March 22, 2019 0526 .  polyvinyl alcohol (LIQUIFILM TEARS) 1.4 % ophthalmic solution 1 drop, 1 drop, Both Eyes, QID PRN, Billy Fischer B, MD .  sodium chloride flush (NS) 0.9 % injection 3 mL, 3 mL, Intravenous, Q12H, Sudini, Srikar, MD, 3 mL at 03/07/19 1942 .  vasopressin (PITRESSIN) 40 Units in sodium chloride 0.9 % 250 mL (0.16 Units/mL) infusion, 0.03 Units/min, Intravenous, Continuous, Blakeney, Dana G, NP, Last Rate: 11.25 mL/hr at 2019/03/22 0400, 0.03 Units/min at 22-Mar-2019 0400  Review of Systems  Social History   Tobacco Use  . Smoking status: Current Every Day Smoker    Years: 50.00    Types: Cigarettes  . Smokeless tobacco: Never Used  . Tobacco comment: Previously smoked 2 ppd  Substance Use Topics  . Alcohol use: No    Alcohol/week: 0.0 standard drinks      Objective:   There were no vitals taken for this visit. There were no vitals filed for this visit.   Physical Exam      Assessment & Plan        Mila Merry, MD  [facility]

## 2019-03-26 NOTE — Progress Notes (Signed)
PCCM ROUNDING NOTE  INITIAL PRESENTATION: 68 y.o. F smoker with COPD, recurrent aspiration pneumonias and multiple other chronic medical problems, recently discharged (03/02/2019) from [facility] with discharge diagnosis of HCAP and sepsis.  Return to [facility] ED via EMS with fever, AMS, hypoxemia.  Intubated in ED. after intubation, reportedly ETS specimen was consistent with gastric contents.  Patient is now DNR in event of cardiac arrest per ER notes   MAJOR EVENTS/TEST RESULTS: 04/11 admission as above 04/11 CT head: No acute intracranial abnormality noted 04/12 increasing vasopressor requirements (norepinephrine, phenylephrine, vasopressin).  Remains unresponsive.  Oliguria with rising creatinine. 04/13 remains comatose.  Remains on maximum dose vasopressors.  Worsening urine output and rising creatinine.  Worsening circulation to both feet which are now violaceous and cold.  Goals of care conversation with patient's husband and children.  Decision made to proceed with terminal extubation and comfort measures only  INDWELLING DEVICES:: ETT 04/11 >> 04/13 R femoral CVL 04/11 >>   MICRO DATA: MRSA PCR 4/11 >> negative Urine 4/11 >> NEG Resp 4/11 >> moderate GNRs  Blood 4/11 >>  PCT: 105,  > 150,   ANTIMICROBIALS:  Pip-tazo 04/11 >> 04/12 Meropenem 04/12 >> 04/13    SUBJ: As above   OBJ: Vitals:   07-Apr-2019 0413 2019-04-07 0500 04/07/19 0600 2019/04/07 0700  BP:  (!) 102/40 (!) 106/46 95/62  Pulse:      Resp: 20 20 (!) 24 (!) 22  Temp:      TempSrc:      SpO2:      Weight:      Height:        Vent Mode: AC FiO2 (%):  [80 %] 80 % Set Rate:  [16 bmp] 16 bmp Vt Set:  [400 mL] 400 mL PEEP:  [8 cmH20] 8 cmH20   Intake/Output Summary (Last 24 hours) at 2019/04/07 1110 Last data filed at 04/07/2019 0400 Gross per 24 hour  Intake 3784.99 ml  Output 245 ml  Net 3539.99 ml     Gen: RASS -4, minimally responsive, intubated, synchronous with ventilator HEENT: NCAT, sclerae white,  edentulous Neck: No JVD noted Lungs: Scattered rhonchi, no wheezes Cardiovascular: Tachycardia with atrial extrasystoles, no M Abdomen: Soft, nontender, normal BS Ext: Both feet are cold, pulseless and violaceous Neuro: Pupils react, no spontaneous movement, no withdrawal Skin: Limited exam, no lesions noted   BMP Latest Ref Rng & Units 04/07/2019 03/07/2019 03/10/2019  Glucose 70 - 99 mg/dL 130(H) 106(H) 79  BUN 8 - 23 mg/dL 42(H) 38(H) 29(H)  Creatinine 0.44 - 1.00 mg/dL 4.02(H) 3.19(H) 2.98(H)  BUN/Creat Ratio 12 - 28 - - -  Sodium 135 - 145 mmol/L 142 141 138  Potassium 3.5 - 5.1 mmol/L 3.9 4.3 3.1(L)  Chloride 98 - 111 mmol/L 115(H) 111 102  CO2 22 - 32 mmol/L 16(L) 18(L) 25  Calcium 8.9 - 10.3 mg/dL 6.1(LL) 7.3(L) 8.2(L)    CBC Latest Ref Rng & Units 04/07/19 03/07/2019 03/15/2019  WBC 4.0 - 10.5 K/uL 34.7(H) 16.7(H) 2.3(L)  Hemoglobin 12.0 - 15.0 g/dL 9.8(L) 11.9(L) 11.6(L)  Hematocrit 36.0 - 46.0 % 33.2(L) 41.1 38.3  Platelets 150 - 400 K/uL 154 340 318    Hepatic Function Latest Ref Rng & Units 2019-04-07 03/07/2019 02/24/2019  Total Protein 6.5 - 8.1 g/dL 4.9(L) 5.6(L) 6.2(L)  Albumin 3.5 - 5.0 g/dL 2.2(L) 2.6(L) 3.0(L)  AST 15 - 41 U/L 595(H) 317(H) 30  ALT 0 - 44 U/L 336(H) 126(H) 11  Alk Phosphatase 38 - 126  U/L 56 44 51  Total Bilirubin 0.3 - 1.2 mg/dL 0.6 0.6 0.7  Bilirubin, Direct 0.1 - 0.5 mg/dL - - -    Cardiac Panel (last 3 results) Recent Labs    03/01/2019 1339  TROPONINI 0.07*    CXR: Increased infiltrate in RLL, new infiltrate in RUL    IMPRESSION/PLAN: Acute hypoxemic respiratory failure Severe COPD History of recurrent aspiration pneumonia  RLL PNA with GNR Severe sepsis with refractory septic shock Persistent lactic acidosis History of CAD History of dilated cardiomyopathy (LVEF 45-50% by echo 11/03/2018) AKI, oliguric Hypokalemia, resolved Severe protein-calorie malnutrition Markedly elevated procalcitonin Mild macrocytic anemia without  acute blood loss Leukopenia, resolved: Acute septic encephalopathy Chronic opioid use prior to admission  Terminal extubation and comfort measures only  Merton Border, MD PCCM service Mobile [phone] Pager [phone] 03/09/19 11:10 AM

## 2019-03-26 NOTE — Progress Notes (Signed)
Patient extubated, per MD order, to 2lnc and comfort care with no complications.

## 2019-03-26 NOTE — Progress Notes (Signed)
Pastoral Care Visit   2019/04/06 0955  Clinical Encounter Type  Visited With Patient and family together  Visit Type Follow-up  Consult/Referral To Chaplain  Spiritual Encounters  Spiritual Needs Grief support   Pt was intubated and unable to communicate.  Chap visited w/ pt husband to offer grief support.  Husband was relatively quiet and indicated that they had another chap visit yesterday who prayed with them.  Chap notified pt husband that he would be available anytime to visit, listen, and/or pray if desired.    Milinda Antis, [address]

## 2019-03-26 NOTE — Telephone Encounter (Signed)
TCM completed in elapsed time. FYI to PCP.

## 2019-03-26 NOTE — Progress Notes (Signed)
Returned patient's husband's call.  Updated on current status.

## 2019-03-26 NOTE — Telephone Encounter (Signed)
Noted, thank you.  -MM 

## 2019-03-26 NOTE — Chronic Care Management (AMB) (Signed)
  Chronic Care Management   Note  03/24/2019 Name: Cheryl [name] MRN: [id number] DOB: 1951/07/09  Care Coordination: [name] has been referred to the CCM Team by Nonah Mattes, [facility] RN [facility] Liaison 03/02/2019 post hospital discharge secondary to Sepsis. TOC was completed by Hyacinth Meeker, LPN on 04/29/353 and no needs for [facility] RN CM identified. Patient has had 6 hospitalizations in the last 6 months. Today CCM RN CM prepared to follow up with Cheryl [name], however discovered Cheryl [name] presented to [facility] ED 31-Mar-2019 in Acute Respiratory Failure and is not ventilated. She is now a DNR with "poor prognosis" according to ERM documentation.   Plan: Will continue to follow patients health progression while hospitalized. Patient will not be consented for CCM services at this time.  Darla Mcdonald E. Suzie Portela, RN, BSN Nurse Care Coordinator [facility] Care Management [phone]

## 2019-03-26 DEATH — deceased

## 2019-05-13 ENCOUNTER — Encounter: Payer: Medicare HMO | Admitting: Nurse Practitioner

## 2019-06-03 ENCOUNTER — Other Ambulatory Visit: Payer: Medicare HMO

## 2019-06-03 ENCOUNTER — Ambulatory Visit: Payer: Medicare HMO | Admitting: Oncology
